# Patient Record
Sex: Male | Born: 1940 | Race: White | Hispanic: No | Marital: Married | Smoking: Former smoker
Health system: Southern US, Community
[De-identification: ages and names within clinical notes are randomized; demographics above are authoritative.]

## PROBLEM LIST (undated history)

## (undated) DIAGNOSIS — K5521 Angiodysplasia of colon with hemorrhage: Secondary | ICD-10-CM

## (undated) DIAGNOSIS — T4145XA Adverse effect of unspecified anesthetic, initial encounter: Secondary | ICD-10-CM

## (undated) DIAGNOSIS — K922 Gastrointestinal hemorrhage, unspecified: Secondary | ICD-10-CM

## (undated) DIAGNOSIS — K209 Esophagitis, unspecified without bleeding: Secondary | ICD-10-CM

## (undated) DIAGNOSIS — T8859XA Other complications of anesthesia, initial encounter: Secondary | ICD-10-CM

## (undated) DIAGNOSIS — F419 Anxiety disorder, unspecified: Secondary | ICD-10-CM

## (undated) DIAGNOSIS — D649 Anemia, unspecified: Secondary | ICD-10-CM

## (undated) DIAGNOSIS — I714 Abdominal aortic aneurysm, without rupture, unspecified: Secondary | ICD-10-CM

## (undated) DIAGNOSIS — K253 Acute gastric ulcer without hemorrhage or perforation: Secondary | ICD-10-CM

## (undated) DIAGNOSIS — E119 Type 2 diabetes mellitus without complications: Secondary | ICD-10-CM

## (undated) DIAGNOSIS — E559 Vitamin D deficiency, unspecified: Secondary | ICD-10-CM

## (undated) DIAGNOSIS — F329 Major depressive disorder, single episode, unspecified: Secondary | ICD-10-CM

## (undated) DIAGNOSIS — E785 Hyperlipidemia, unspecified: Secondary | ICD-10-CM

## (undated) DIAGNOSIS — D132 Benign neoplasm of duodenum: Secondary | ICD-10-CM

## (undated) DIAGNOSIS — R413 Other amnesia: Secondary | ICD-10-CM

## (undated) DIAGNOSIS — E118 Type 2 diabetes mellitus with unspecified complications: Secondary | ICD-10-CM

## (undated) DIAGNOSIS — I219 Acute myocardial infarction, unspecified: Secondary | ICD-10-CM

## (undated) DIAGNOSIS — Z9289 Personal history of other medical treatment: Secondary | ICD-10-CM

## (undated) DIAGNOSIS — K317 Polyp of stomach and duodenum: Secondary | ICD-10-CM

## (undated) DIAGNOSIS — J449 Chronic obstructive pulmonary disease, unspecified: Secondary | ICD-10-CM

## (undated) DIAGNOSIS — K269 Duodenal ulcer, unspecified as acute or chronic, without hemorrhage or perforation: Secondary | ICD-10-CM

## (undated) DIAGNOSIS — I2510 Atherosclerotic heart disease of native coronary artery without angina pectoris: Secondary | ICD-10-CM

## (undated) DIAGNOSIS — G4700 Insomnia, unspecified: Secondary | ICD-10-CM

## (undated) DIAGNOSIS — J439 Emphysema, unspecified: Secondary | ICD-10-CM

## (undated) DIAGNOSIS — J189 Pneumonia, unspecified organism: Secondary | ICD-10-CM

## (undated) DIAGNOSIS — K5790 Diverticulosis of intestine, part unspecified, without perforation or abscess without bleeding: Secondary | ICD-10-CM

## (undated) DIAGNOSIS — Z9981 Dependence on supplemental oxygen: Secondary | ICD-10-CM

## (undated) DIAGNOSIS — F418 Other specified anxiety disorders: Secondary | ICD-10-CM

## (undated) DIAGNOSIS — D72829 Elevated white blood cell count, unspecified: Secondary | ICD-10-CM

## (undated) DIAGNOSIS — K219 Gastro-esophageal reflux disease without esophagitis: Secondary | ICD-10-CM

## (undated) DIAGNOSIS — I5032 Chronic diastolic (congestive) heart failure: Secondary | ICD-10-CM

## (undated) DIAGNOSIS — F3162 Bipolar disorder, current episode mixed, moderate: Secondary | ICD-10-CM

## (undated) DIAGNOSIS — K449 Diaphragmatic hernia without obstruction or gangrene: Secondary | ICD-10-CM

## (undated) DIAGNOSIS — J309 Allergic rhinitis, unspecified: Secondary | ICD-10-CM

## (undated) DIAGNOSIS — K5710 Diverticulosis of small intestine without perforation or abscess without bleeding: Secondary | ICD-10-CM

## (undated) DIAGNOSIS — I10 Essential (primary) hypertension: Secondary | ICD-10-CM

## (undated) HISTORY — DX: Type 2 diabetes mellitus with unspecified complications: E11.8

## (undated) HISTORY — DX: Gastrointestinal hemorrhage, unspecified: K92.2

## (undated) HISTORY — DX: Diaphragmatic hernia without obstruction or gangrene: K44.9

## (undated) HISTORY — PX: APPENDECTOMY: SHX54

## (undated) HISTORY — DX: Diverticulosis of small intestine without perforation or abscess without bleeding: K57.10

## (undated) HISTORY — DX: Acute gastric ulcer without hemorrhage or perforation: K25.3

## (undated) HISTORY — DX: Esophagitis, unspecified: K20.9

## (undated) HISTORY — DX: Atherosclerotic heart disease of native coronary artery without angina pectoris: I25.10

## (undated) HISTORY — DX: Benign neoplasm of duodenum: D13.2

## (undated) HISTORY — DX: Allergic rhinitis, unspecified: J30.9

## (undated) HISTORY — PX: TONSILLECTOMY: SUR1361

## (undated) HISTORY — PX: FEMORAL ARTERY STENT: SHX1583

## (undated) HISTORY — DX: Anxiety disorder, unspecified: F41.9

## (undated) HISTORY — PX: CORONARY ARTERY BYPASS GRAFT: SHX141

## (undated) HISTORY — DX: Major depressive disorder, single episode, unspecified: F32.9

## (undated) HISTORY — DX: Esophagitis, unspecified without bleeding: K20.90

## (undated) HISTORY — PX: LACERATION REPAIR: SHX5168

## (undated) HISTORY — PX: CARDIAC CATHETERIZATION: SHX172

---
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
## 1998-02-11 ENCOUNTER — Ambulatory Visit (HOSPITAL_COMMUNITY): Admission: RE | Admit: 1998-02-11 | Discharge: 1998-02-11 | Payer: Self-pay | Admitting: *Deleted

## 1998-02-26 ENCOUNTER — Encounter: Admission: RE | Admit: 1998-02-26 | Discharge: 1998-02-26 | Payer: Self-pay | Admitting: Family Medicine

## 1998-03-24 ENCOUNTER — Encounter: Admission: RE | Admit: 1998-03-24 | Discharge: 1998-03-24 | Payer: Self-pay | Admitting: Family Medicine

## 2000-10-15 ENCOUNTER — Encounter: Payer: Self-pay | Admitting: Emergency Medicine

## 2000-10-15 ENCOUNTER — Emergency Department (HOSPITAL_COMMUNITY): Admission: EM | Admit: 2000-10-15 | Discharge: 2000-10-15 | Payer: Self-pay | Admitting: Emergency Medicine

## 2001-01-08 ENCOUNTER — Inpatient Hospital Stay (HOSPITAL_COMMUNITY): Admission: EM | Admit: 2001-01-08 | Discharge: 2001-01-09 | Payer: Self-pay

## 2006-07-28 ENCOUNTER — Inpatient Hospital Stay (HOSPITAL_COMMUNITY): Admission: AD | Admit: 2006-07-28 | Discharge: 2006-07-31 | Payer: Self-pay | Admitting: Cardiovascular Disease

## 2007-03-01 IMAGING — CR DG CHEST 2V
2 series · 2 of 2 positions shown · non-contrast
Comparison: There are currently no prior studies available for comparison.

CLINICAL DATA: Chest pain, pre-catheterization. 
 CHEST - 2 VIEW ? 07/28/06:

[w chest pa]
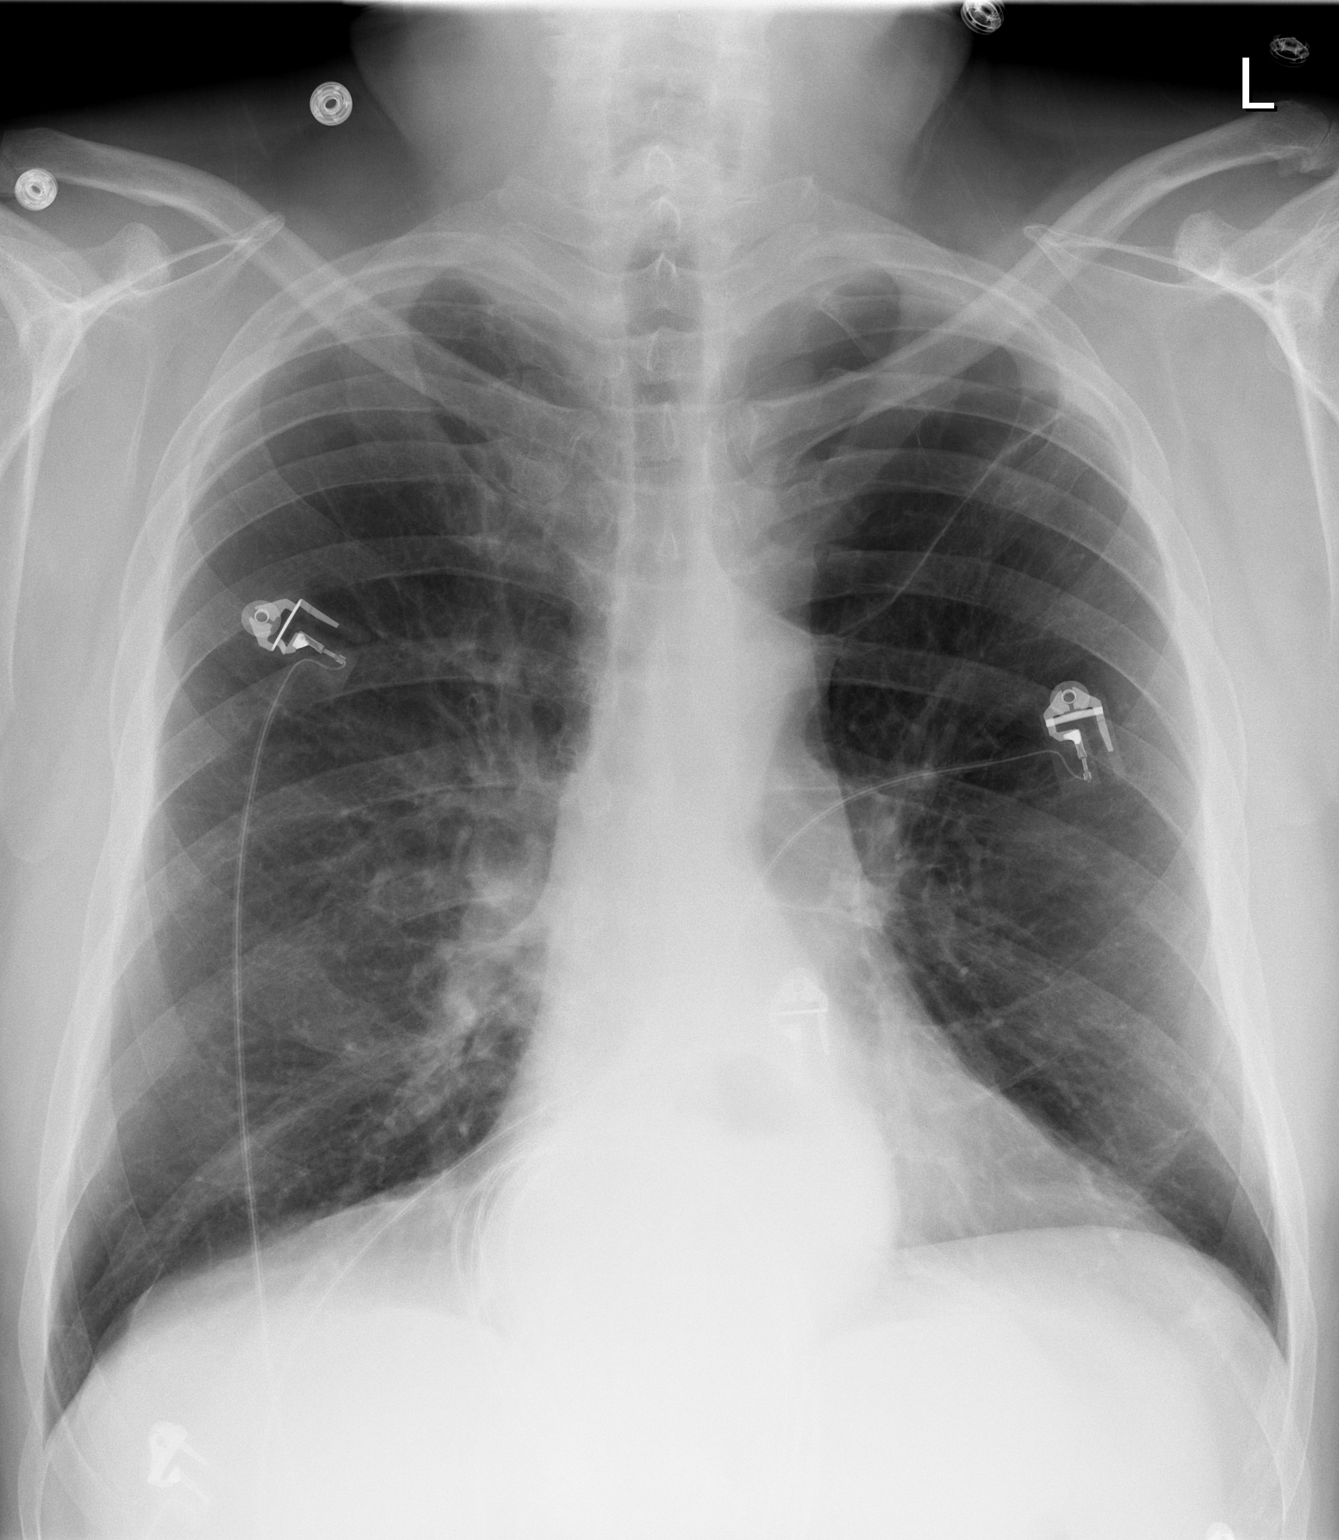

[w chest lat]
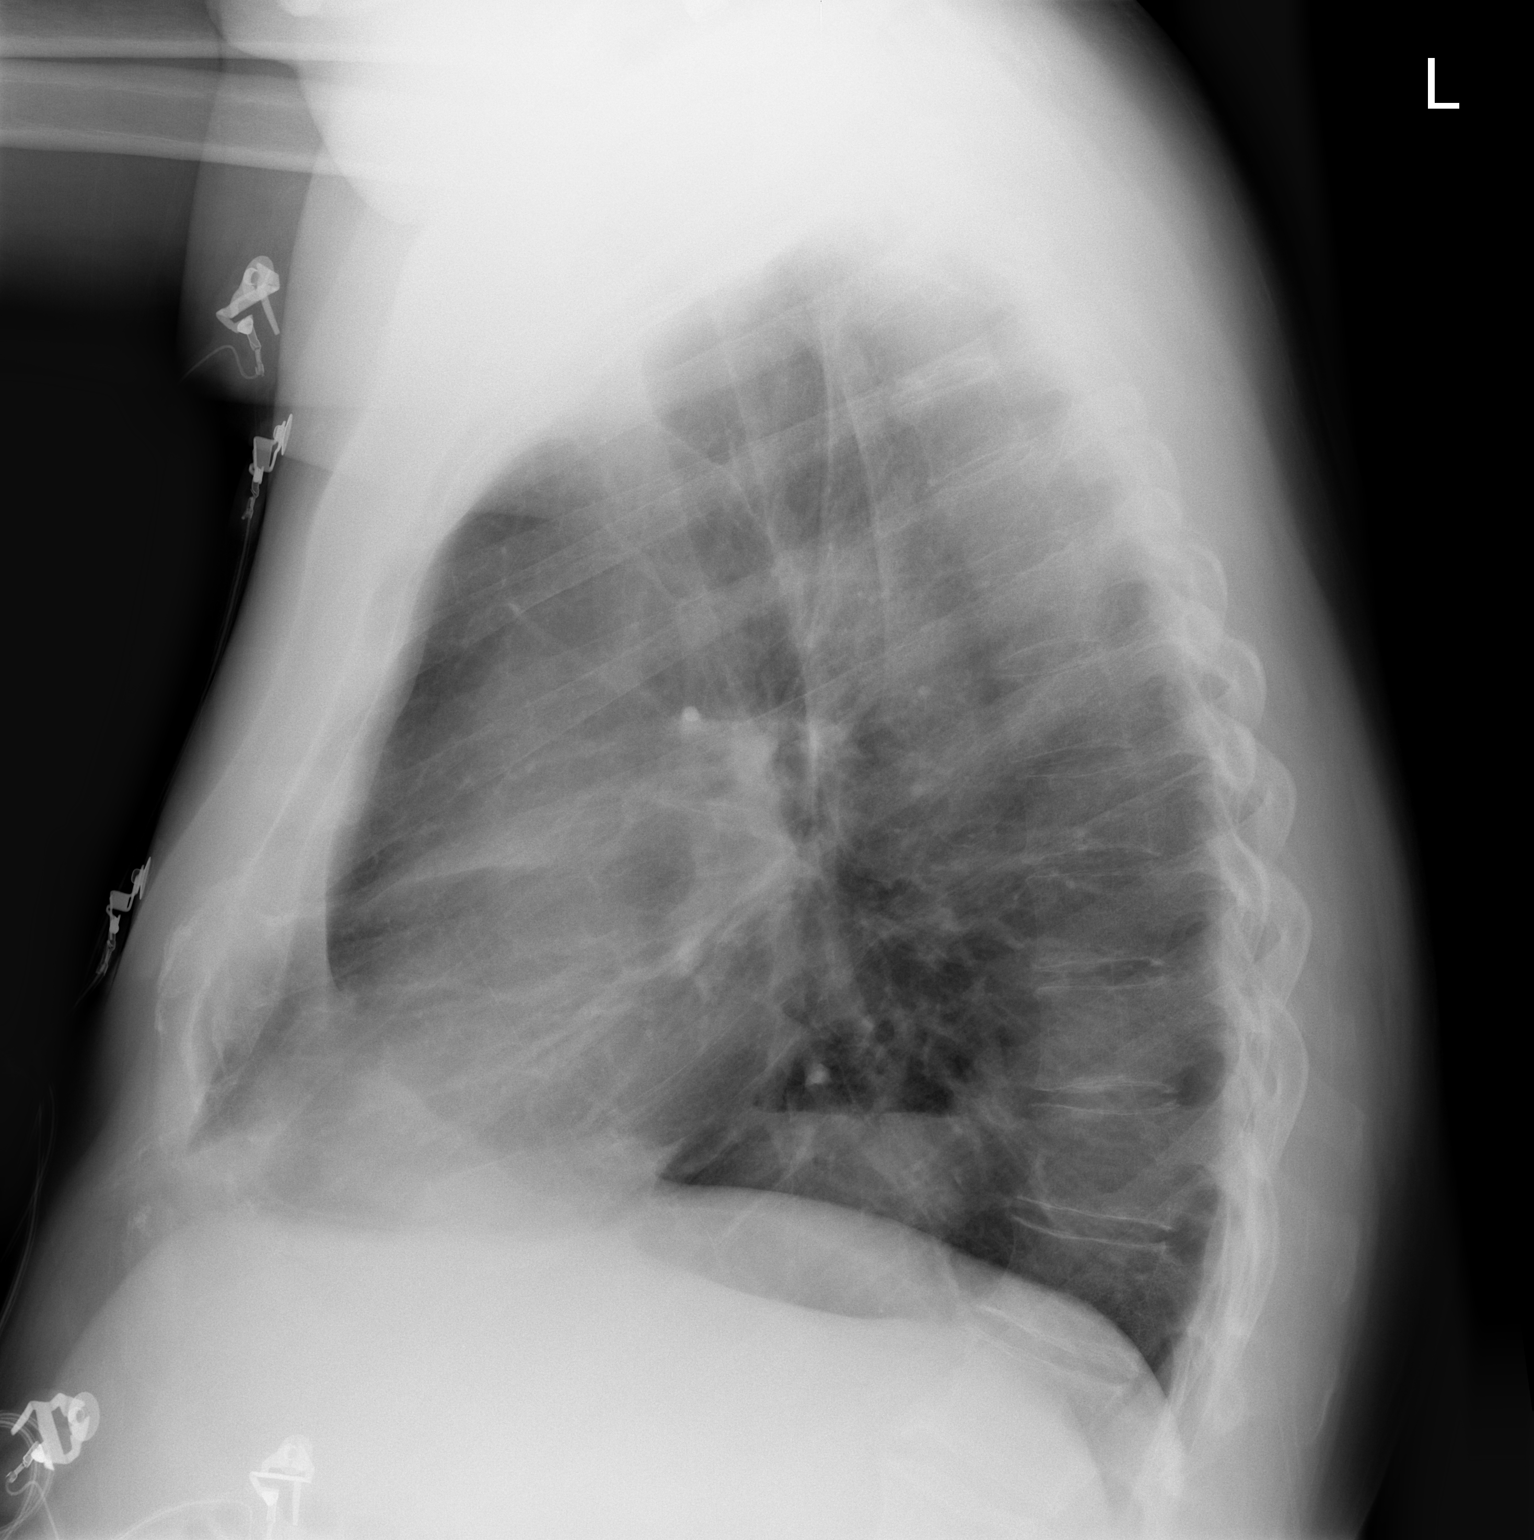

[2 of 2 positions shown; findings below may reference images not displayed]

FINDINGS: There are large bullous areas seen within the left upper lobe. There is a large hiatal hernia which contains an air fluid level.   There are changes of COPD noted.  There are no infiltrative or edematous changes and the heart is normal in size.   No mediastinal or hilar abnormalities.
IMPRESSION: 1.  Changes of COPD with a large bullous area within the left upper lobe.  
 2.  Large hiatal hernia. 
 3.  No evidence for active chest disease.

## 2008-01-04 ENCOUNTER — Ambulatory Visit: Payer: Self-pay | Admitting: Surgery

## 2008-01-04 ENCOUNTER — Ambulatory Visit: Payer: Self-pay | Admitting: Internal Medicine

## 2008-01-04 ENCOUNTER — Inpatient Hospital Stay (HOSPITAL_COMMUNITY): Admission: EM | Admit: 2008-01-04 | Discharge: 2008-01-21 | Payer: Self-pay | Admitting: Emergency Medicine

## 2008-01-16 ENCOUNTER — Ambulatory Visit: Payer: Self-pay | Admitting: Physical Medicine & Rehabilitation

## 2008-02-04 ENCOUNTER — Ambulatory Visit: Payer: Self-pay | Admitting: Surgery

## 2008-02-04 ENCOUNTER — Encounter: Admission: RE | Admit: 2008-02-04 | Discharge: 2008-02-04 | Payer: Self-pay | Admitting: Surgery

## 2008-04-28 ENCOUNTER — Ambulatory Visit: Payer: Self-pay | Admitting: Surgery

## 2008-08-06 IMAGING — CR DG ABDOMEN ACUTE W/ 1V CHEST
1 series · 1 of 1 positions shown · non-contrast
Comparison: 07/28/06

CLINICAL DATA: Chest pain and abdominal pain.
 ACUTE ABDOMINAL SERIES WITH CHEST ? 3 VIEW:

[w chest pa]
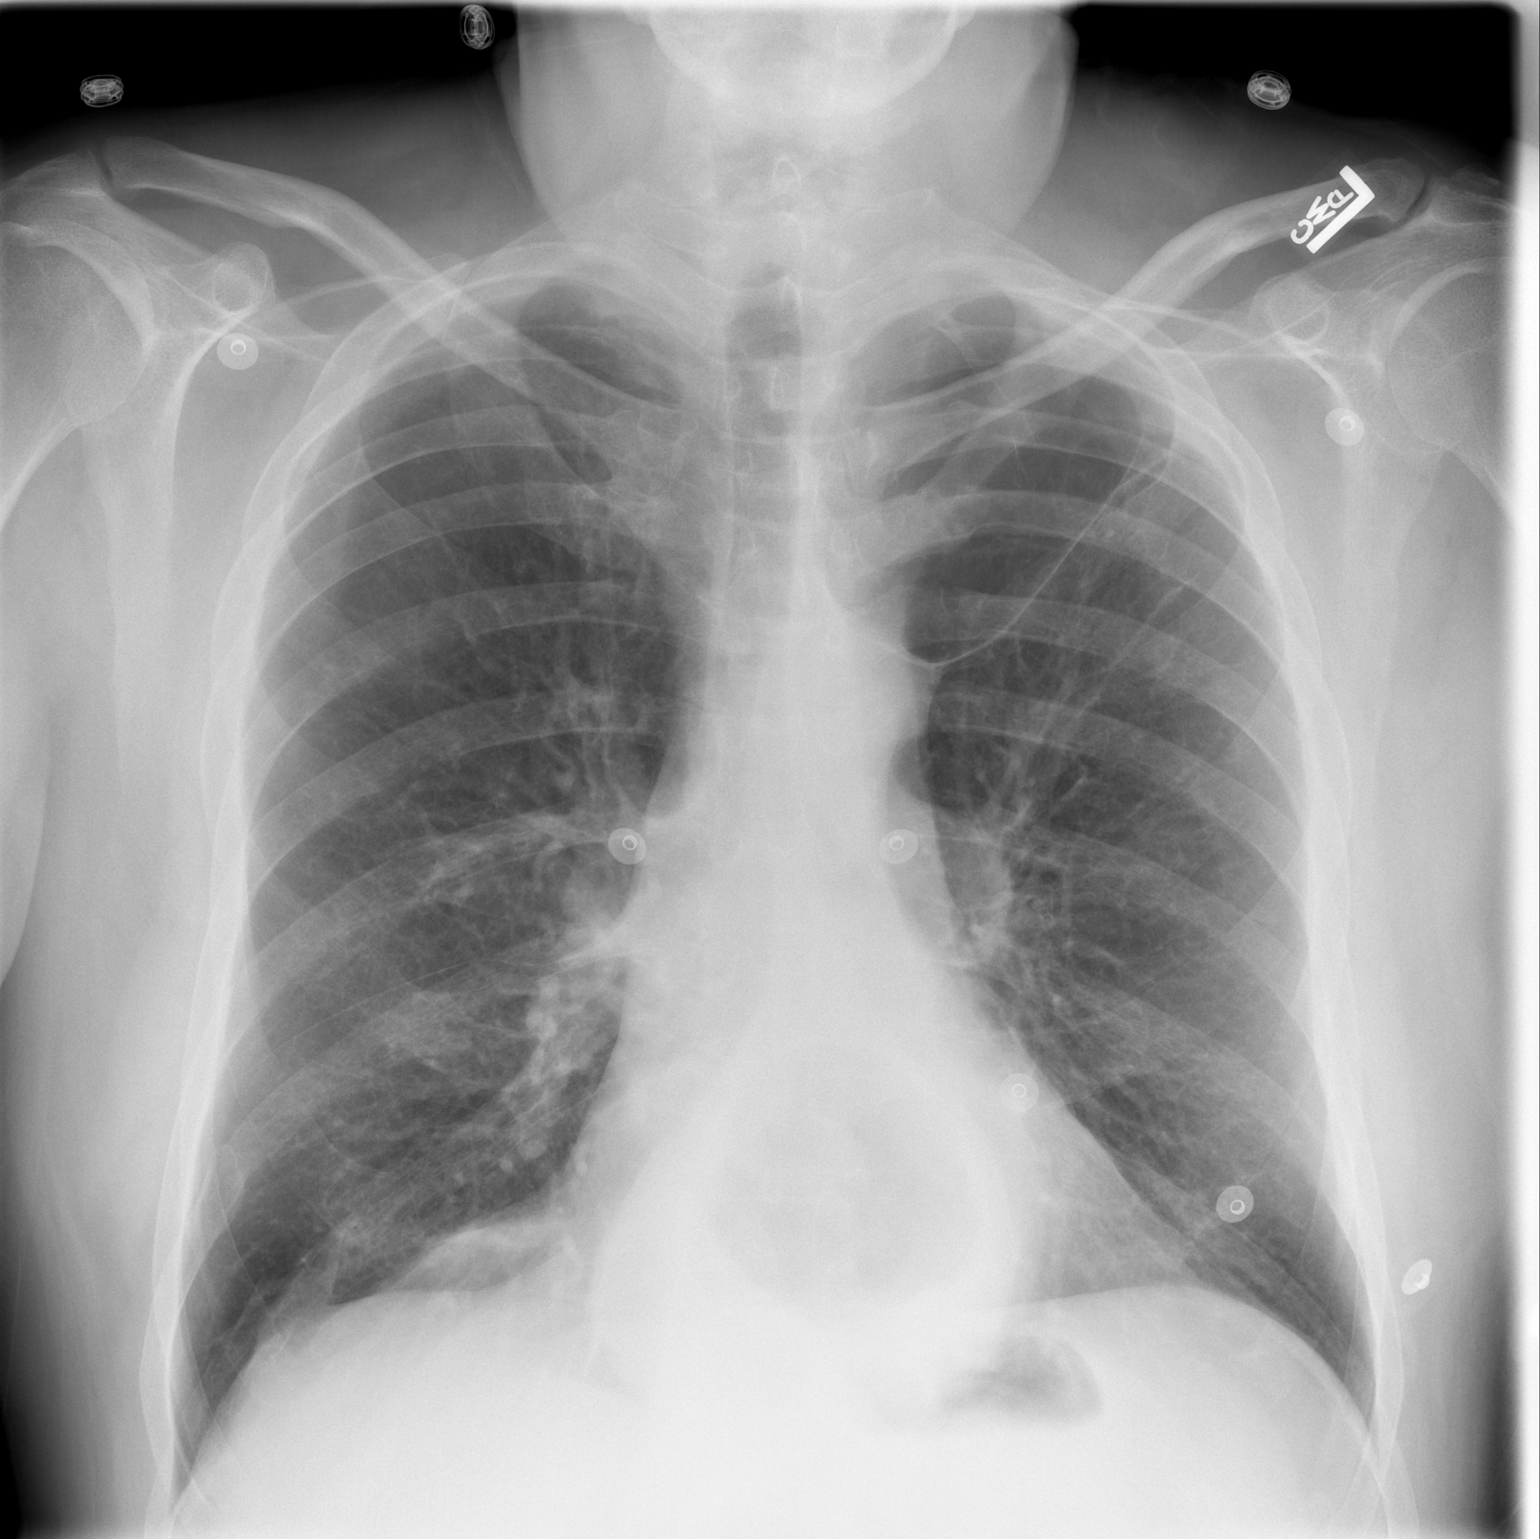

[1 of 1 positions shown; findings below may reference images not displayed]

FINDINGS: Heart size normal.  
 No pleural fluid or pulmonary edema.
 There is bullous emphysema.  There is a large bulla within the left upper lobe measuring 10.8 cm unchanged from prior exam.
 Large hiatal hernia is present.
 There is a nonobstructive bowel gas pattern.  Gas and stool is identified within the colon to the level of the rectum.
IMPRESSION: 1.  Advanced bullous emphysema.
 2.  Large hiatal hernia.
 3.  No specific features of bowel obstruction.

## 2008-08-06 IMAGING — US US SCROTUM
1 series · 13 of 25 positions shown · non-contrast
Comparison: None.

CLINICAL DATA: Scrotal pain.
SCROTAL ULTRASOUND:

DOPPLER ULTRASOUND OF THE TESTICLES:
TECHNIQUE: Complete ultrasound examination of the testicles, epididymis, and other scrotal structures was performed.  Color and spectral Doppler ultrasound were also utilized to evaluate blood flow to the testicles.

[Series 1: unknown · 0.07mm/px · 13 of 71 slices shown]
[im 1/71]
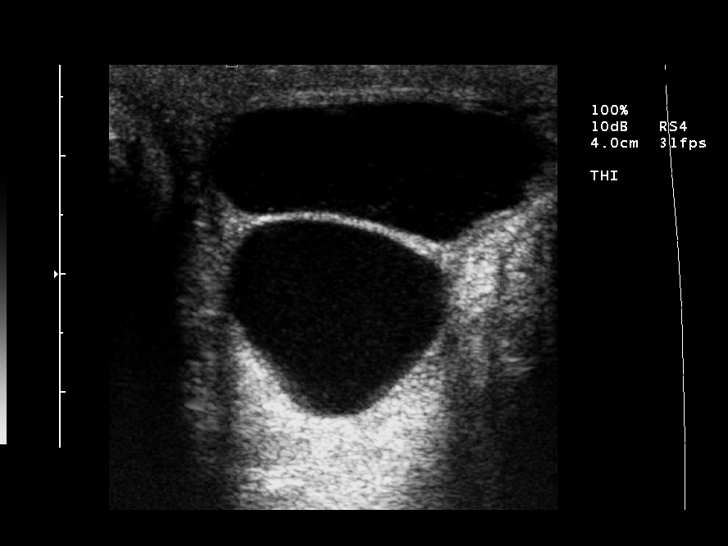
[im 6/71]
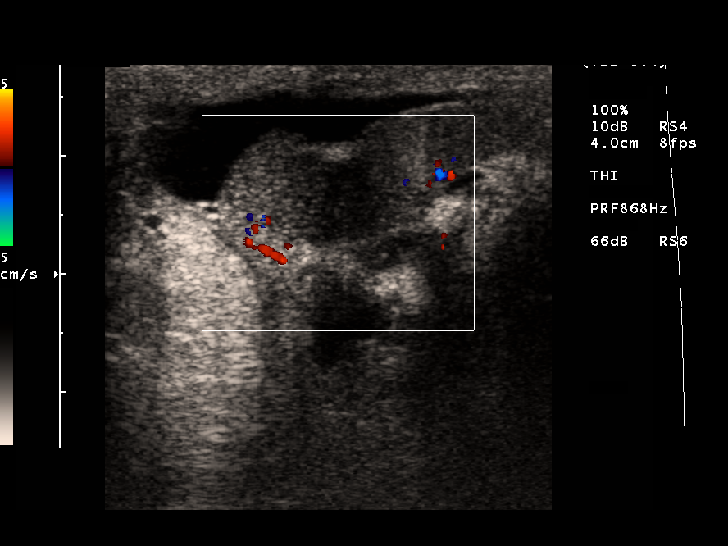
[im 12/71]
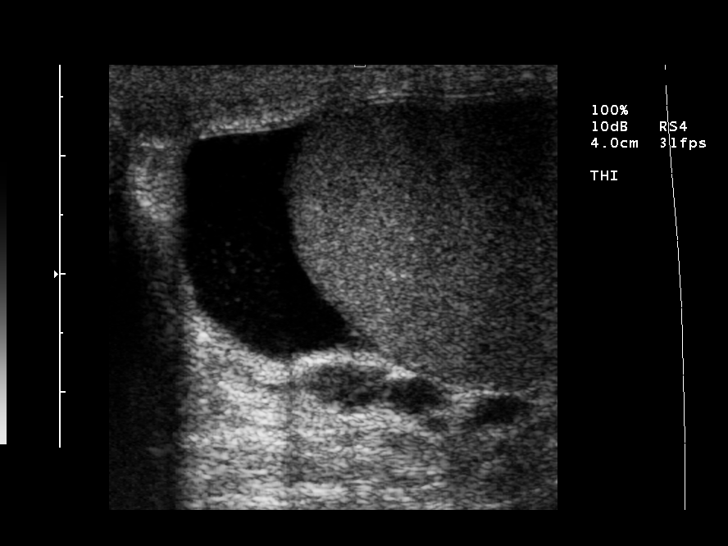
[im 18/71]
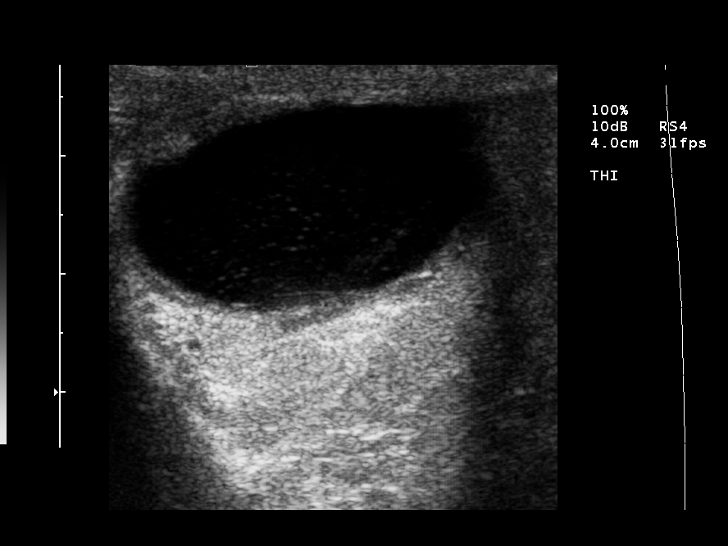
[im 24/71]
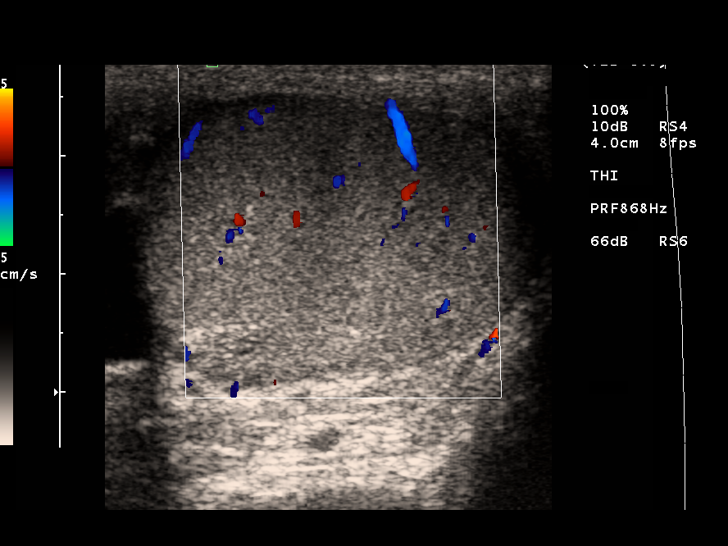
[im 30/71]
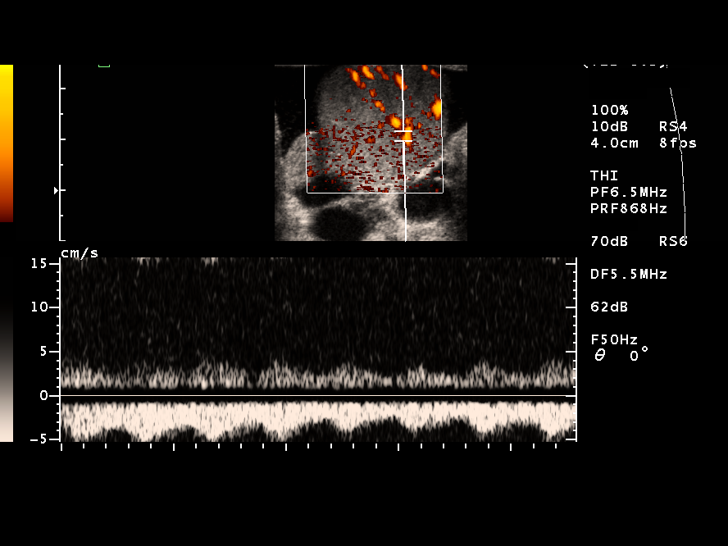
[im 36/71]
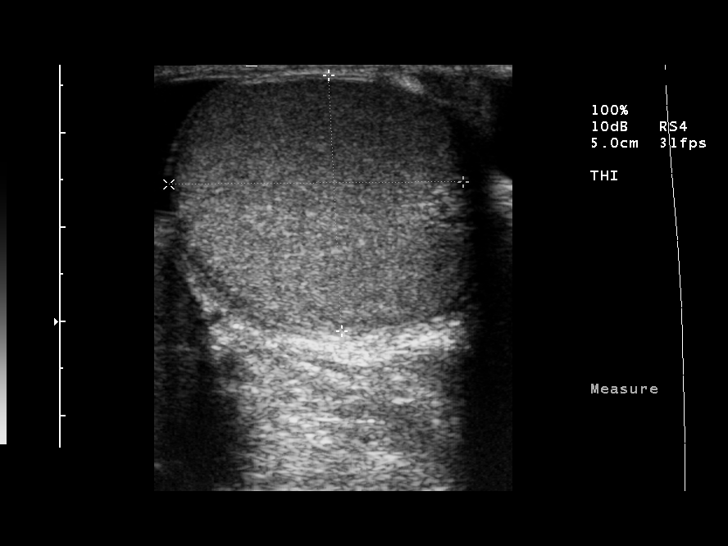
[im 41/71]
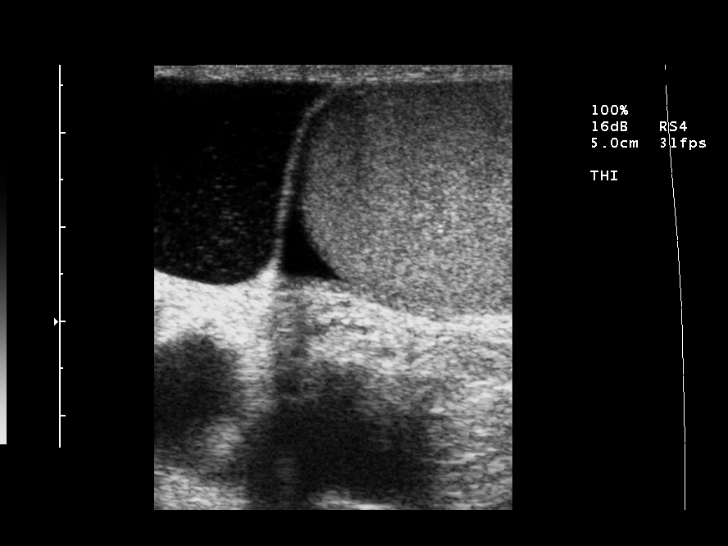
[im 47/71]
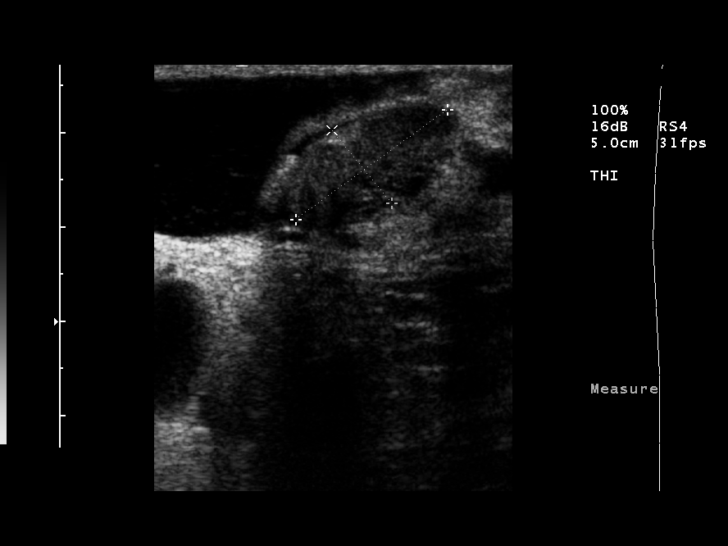
[im 53/71]
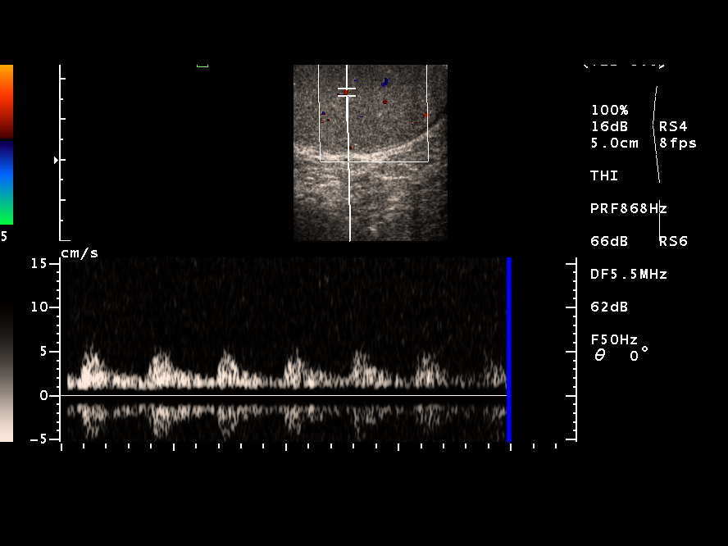
[im 59/71]
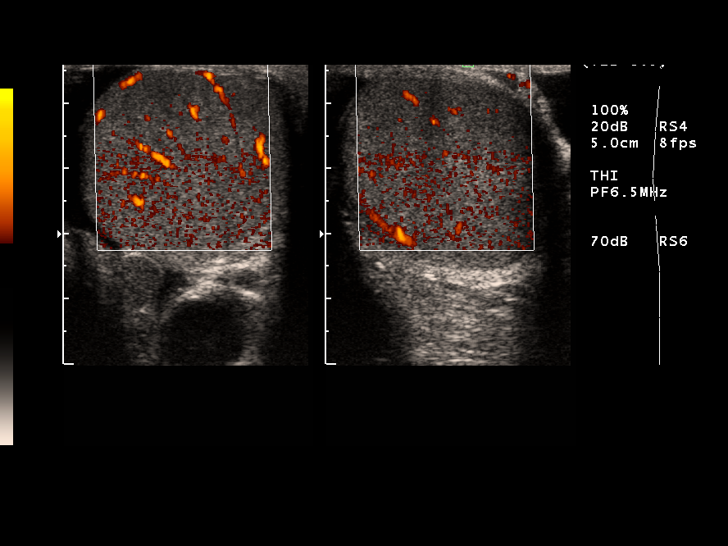
[im 65/71]
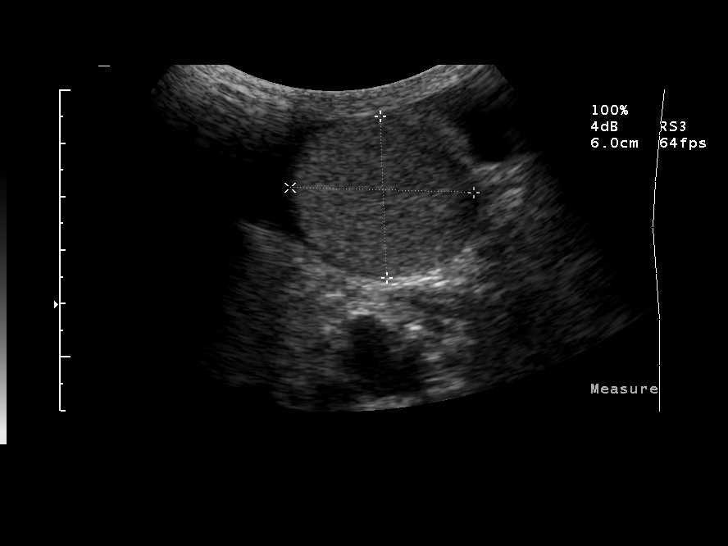
[im 71/71]
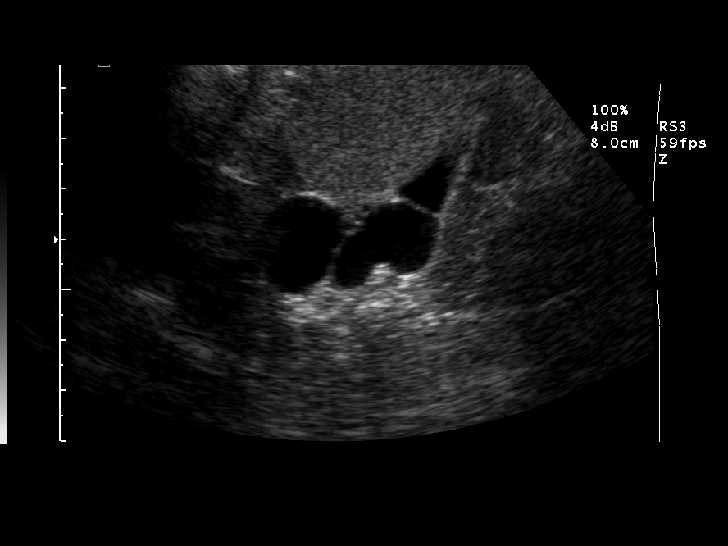

[13 of 25 positions shown; findings below may reference images not displayed]

FINDINGS: The right testis is normal measuring 4.3 x 3.1 x 3.1 cm.  There is normal arterial and venous waveforms without evidence for torsion.  The left testis is also normal.  The left testis appears normal measuring 4.6 x 3.4 x 3.0 cm.  There are normal arterial waveforms to the left testis.  A venous waveform could not be interrogated, for whatever reason.  
There is septated exophytic hypoechoic cystic structure arising from the right epididymis which measures 2.3 x 2.3 x 2.0 cm.  The left epididymis appears normal.  There are large bilateral hydroceles.
IMPRESSION: 1.  No specific features of testicular torsion.
2.  Exophytic cystic mass arising from right epididymis.  Differential diagnosis includes spermatocele, epididymal cyst.  Less likely, this could represent an abscess or hematoma, careful clinical correlation suggested.
 3.  Large bilateral hydroceles.

## 2008-08-06 IMAGING — CR DG ABDOMEN ACUTE W/ 1V CHEST
3 series · 3 of 3 positions shown · non-contrast
Comparison: 07/28/06

CLINICAL DATA: Chest pain and abdominal pain.
 ACUTE ABDOMINAL SERIES WITH CHEST ? 3 VIEW:

[w abdomen upright]
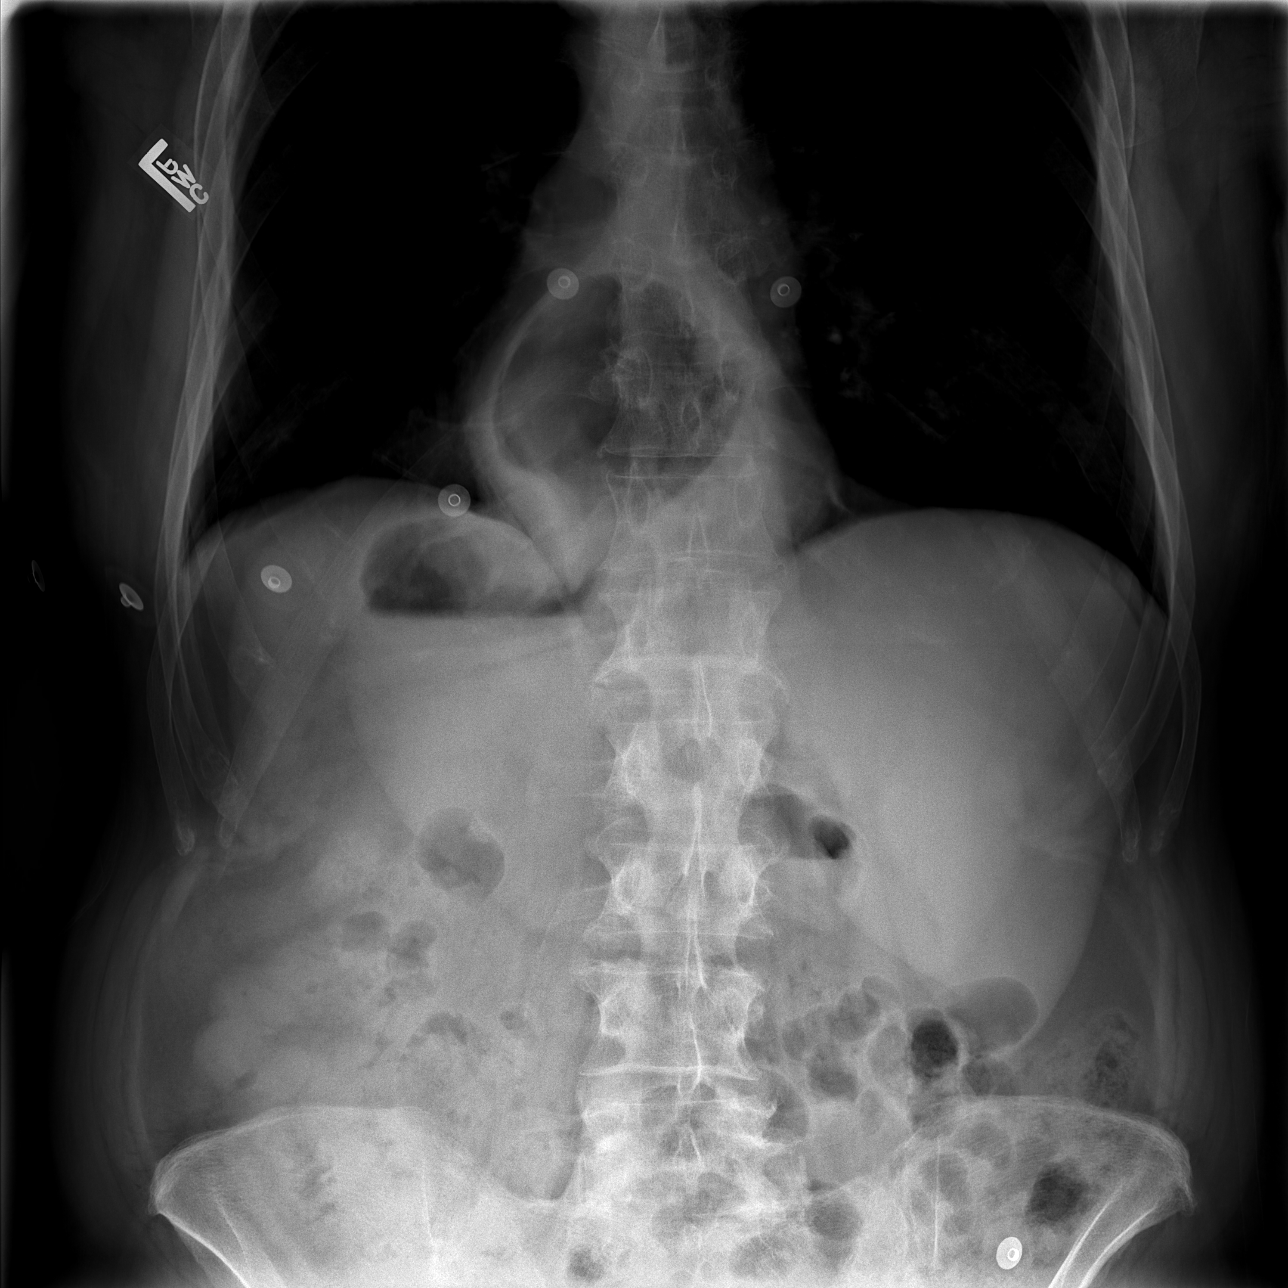

[t abdomen supine (1 of 2)]
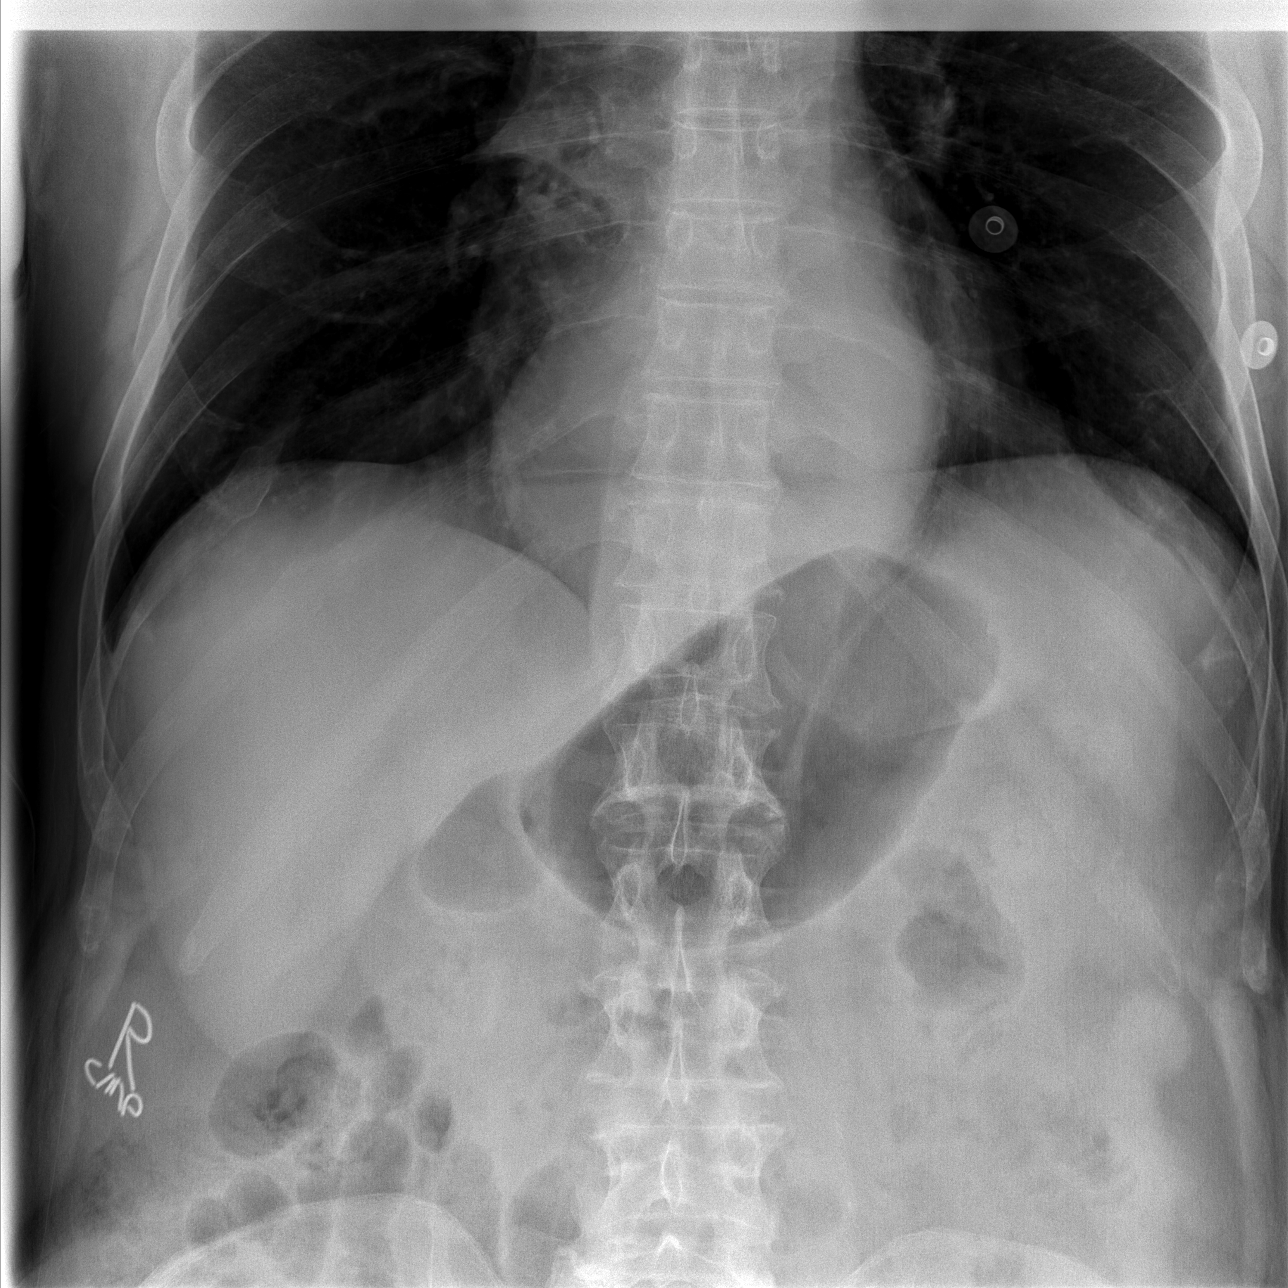

[t abdomen supine (2 of 2)]
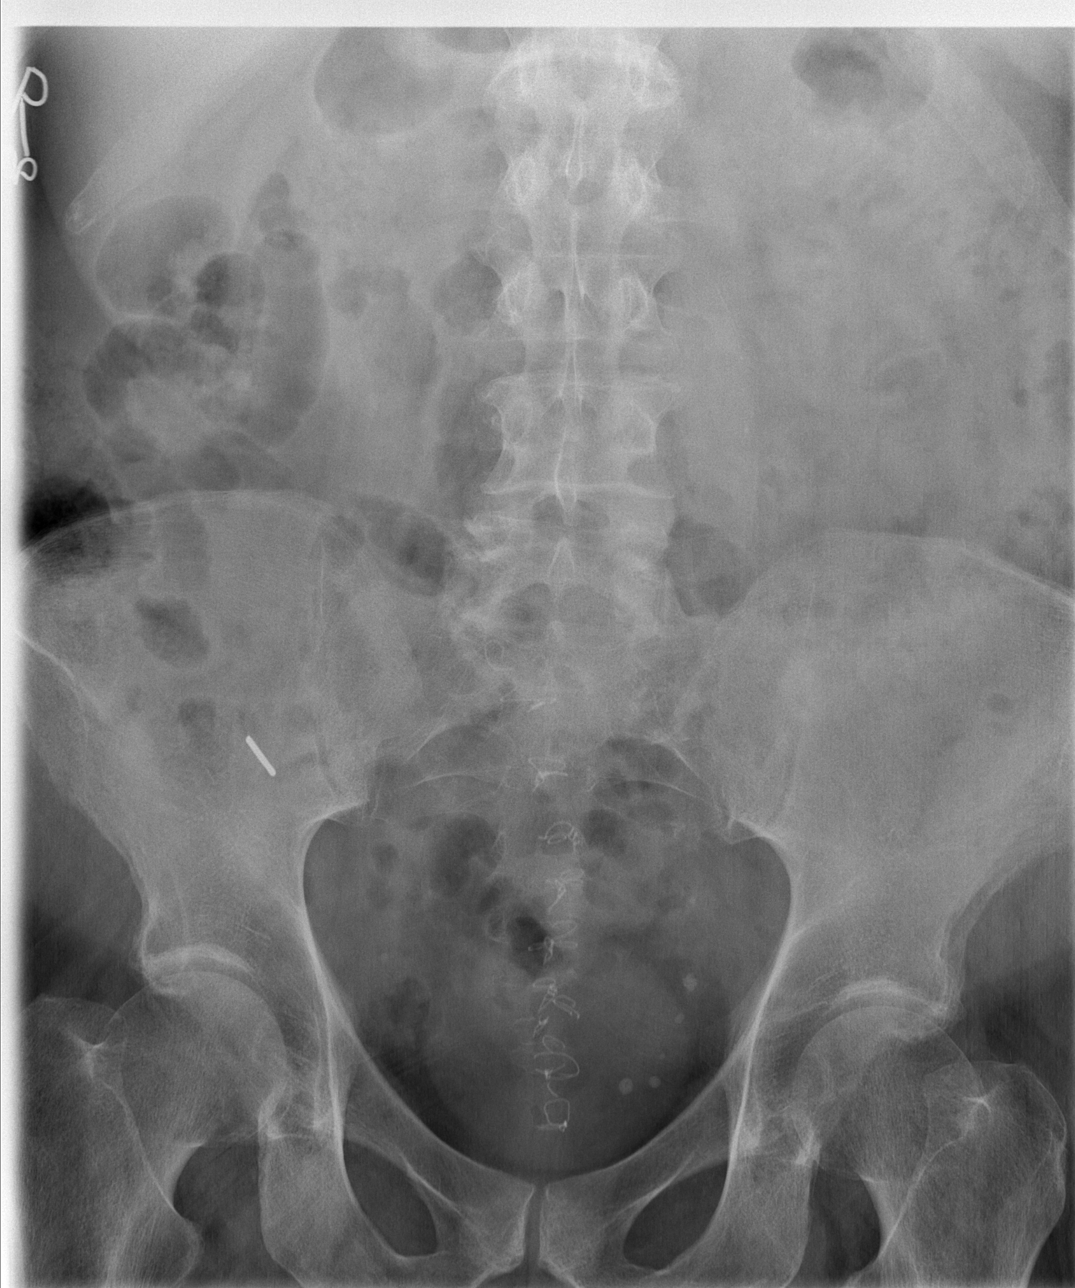

[3 of 3 positions shown; findings below may reference images not displayed]

FINDINGS: Heart size normal.  
 No pleural fluid or pulmonary edema.
 There is bullous emphysema.  There is a large bulla within the left upper lobe measuring 10.8 cm unchanged from prior exam.
 Large hiatal hernia is present.
 There is a nonobstructive bowel gas pattern.  Gas and stool is identified within the colon to the level of the rectum.
IMPRESSION: 1.  Advanced bullous emphysema.
 2.  Large hiatal hernia.
 3.  No specific features of bowel obstruction.

## 2008-08-07 IMAGING — RF DG ABDOMEN 2V
1 series · 3 of 3 positions shown · non-contrast
Comparison: none

CLINICAL DATA: AAA stent.
 ABDOMEN ? 3 SPOT VIEW:

[Series 1: run · 3 of 3 slices shown]
[im 1/3]
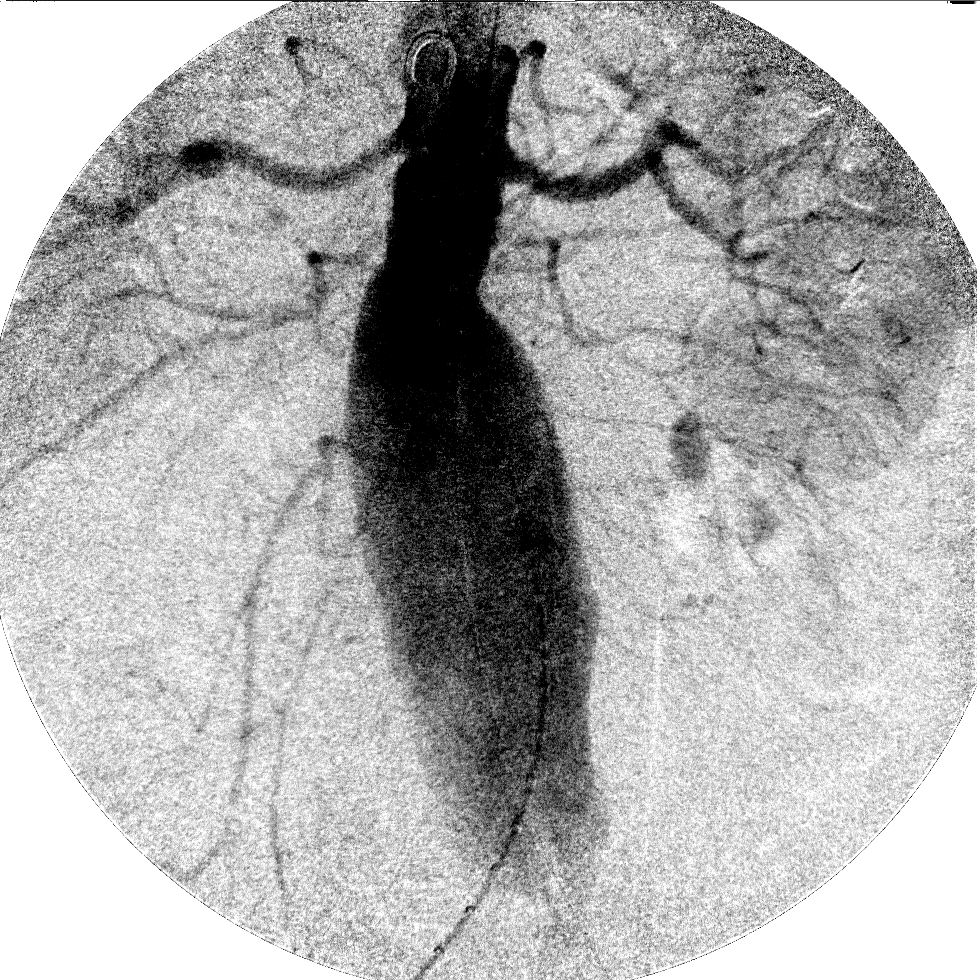
[im 2/3]
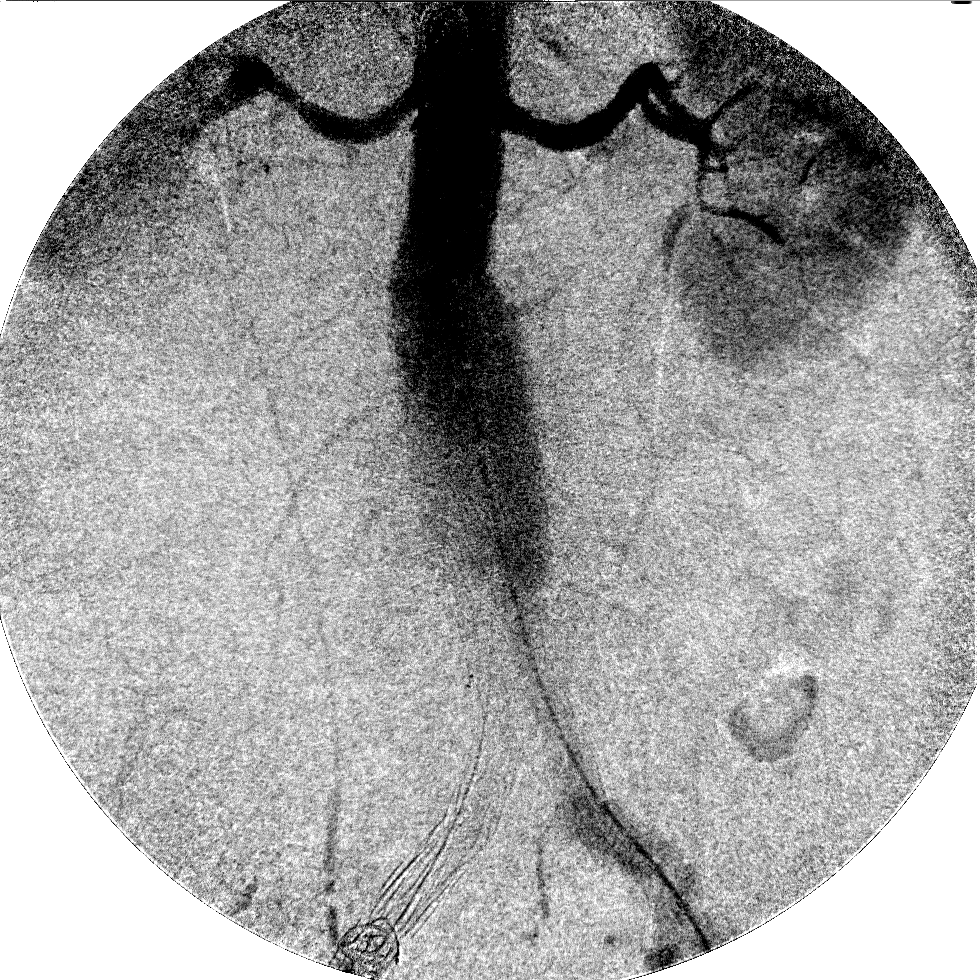
[im 3/3]
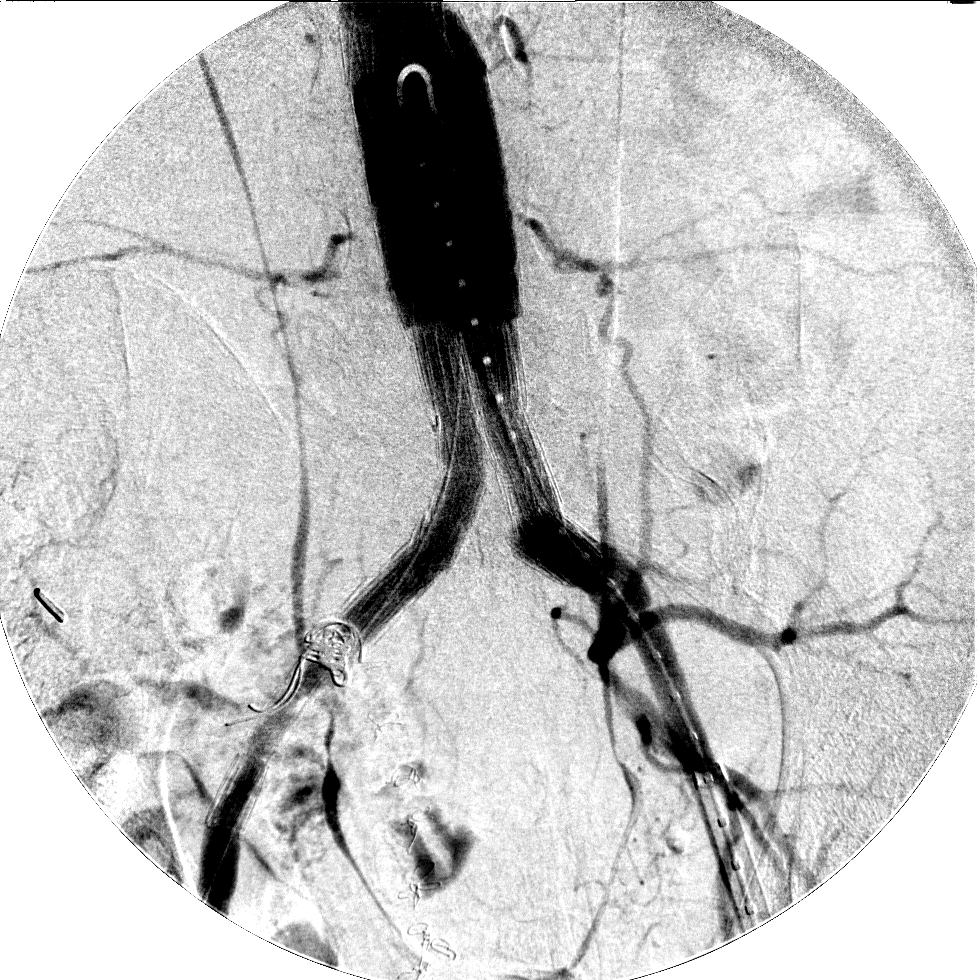

[3 of 3 positions shown; findings below may reference images not displayed]

FINDINGS: Fluoroscopic imaging was provided for aortic and bi-iliac stent graft placement.
IMPRESSION: See above.

## 2008-08-07 IMAGING — CT CT ANGIO ABDOMEN
2 of 5 series · 17 of 46 positions shown, 19 images · IV contrast (agent unspecified)
Comparison: Earlier today.

CLINICAL DATA: Abdominal pain, evaluate abdominal aortic aneurysm for dissection.
CT ANGIOGRAPHY OF ABDOMEN:
TECHNIQUE: Multidetector CT imaging of the abdomen was performed during bolus injection of intravenous contrast.  Multiplanar CT angiographic image reconstructions were generated to evaluate the vascular anatomy.
Contrast:  855cc
TECHNIQUE: Multidetector CT imaging of the pelvis was performed during bolus injection of intravenous contrast.  Multiplanar CT angiographic image reconstructions were generated to evaluate the vascular anatomy.

[Series 4: dissection 2.0 st · axial · 0.76mm/px · z∈[-495,-57]mm · 14 of 241 slices shown, 16 images]
[im 11/241  soft-tissue]
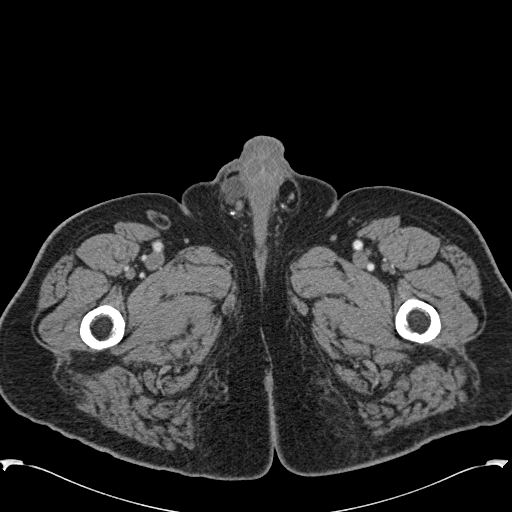
[im 11/241  bone]
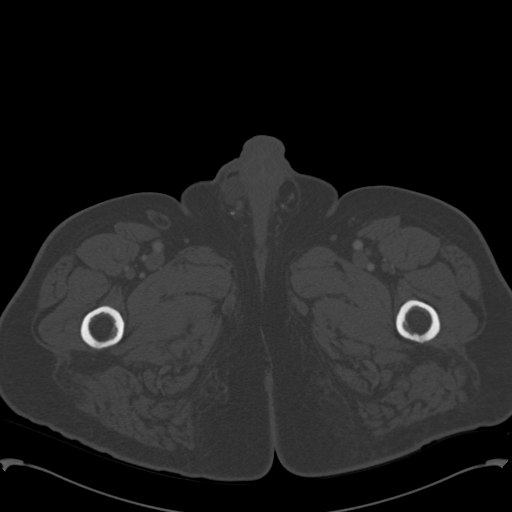
[im 33/241  soft-tissue]
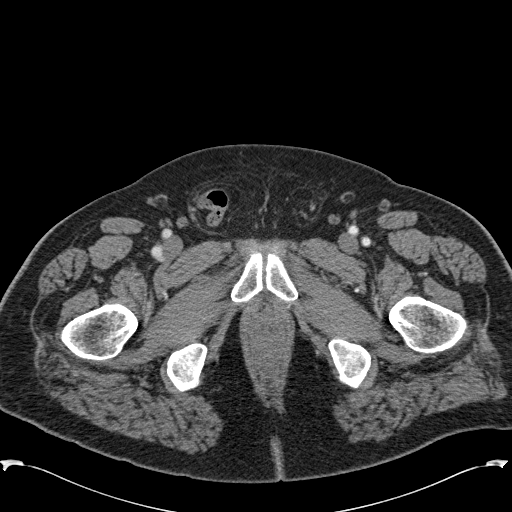
[im 44/241  soft-tissue]
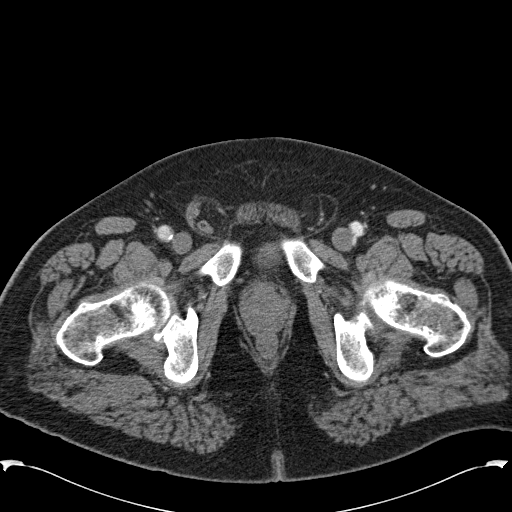
[im 66/241  soft-tissue]
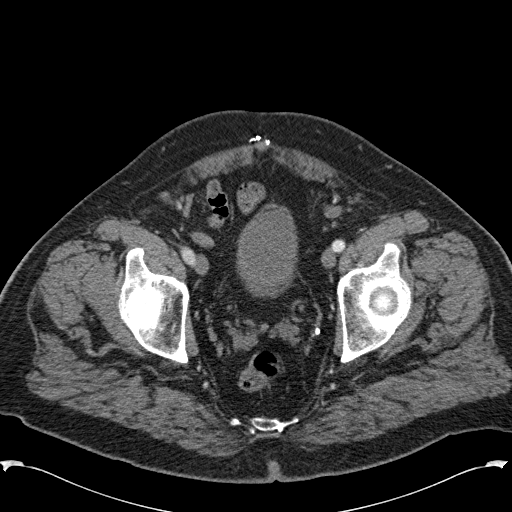
[im 77/241  soft-tissue]
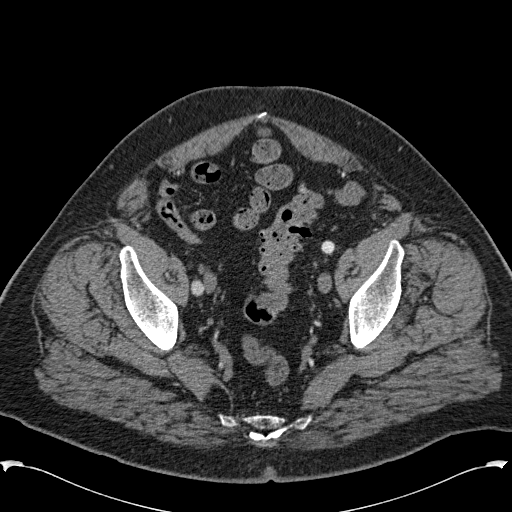
[im 99/241  soft-tissue]
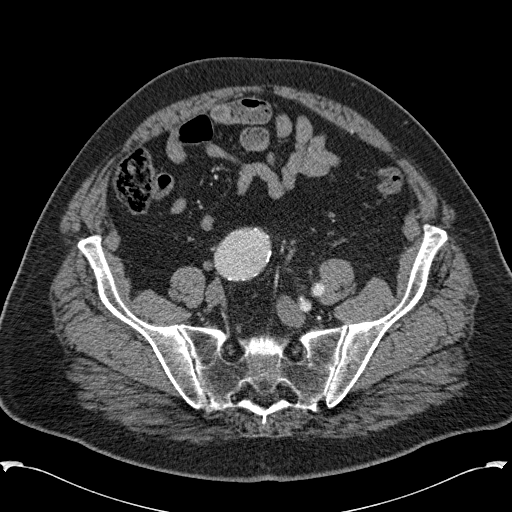
[im 110/241  soft-tissue]
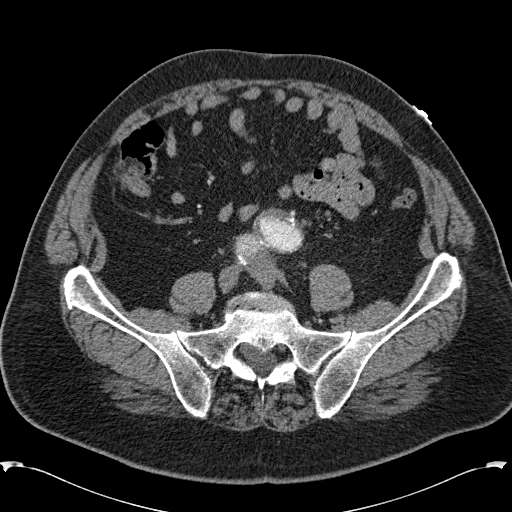
[im 131/241  soft-tissue]
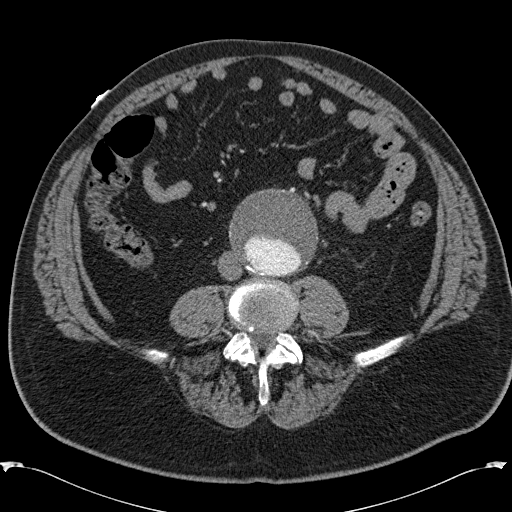
[im 142/241  soft-tissue]
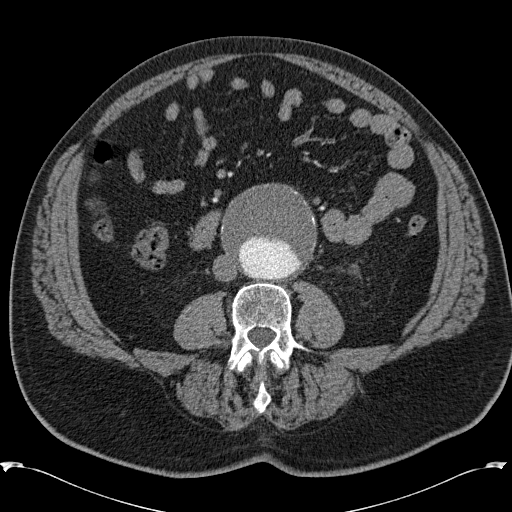
[im 142/241  bone]
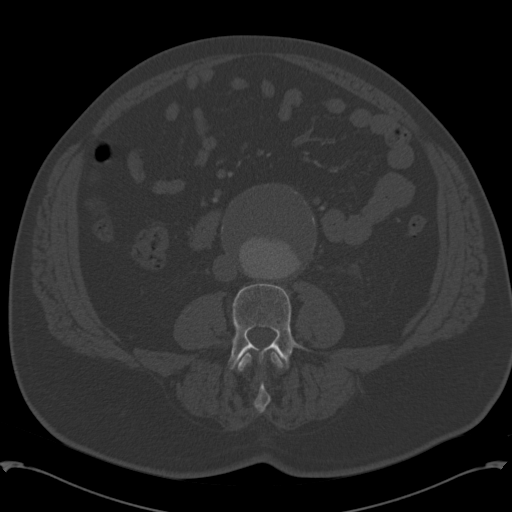
[im 164/241  soft-tissue]
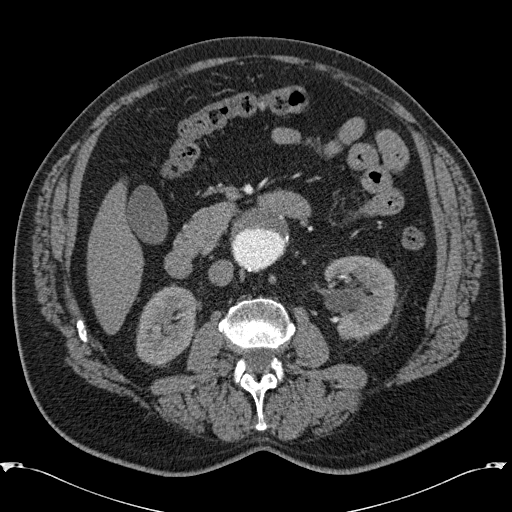
[im 175/241  soft-tissue]
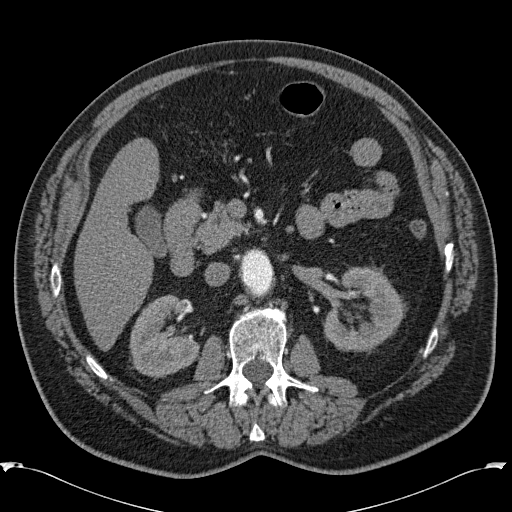
[im 197/241  soft-tissue]
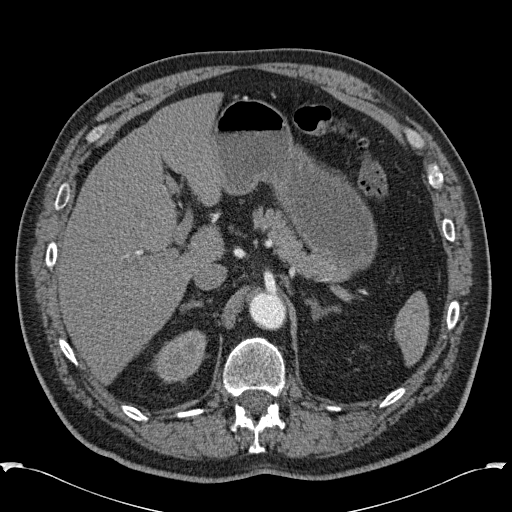
[im 208/241  soft-tissue]
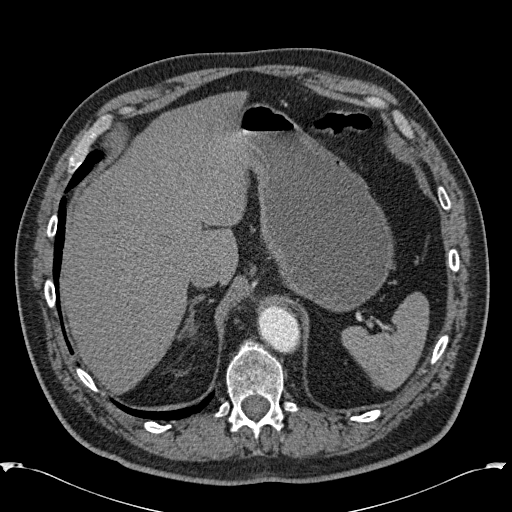
[im 230/241  soft-tissue]
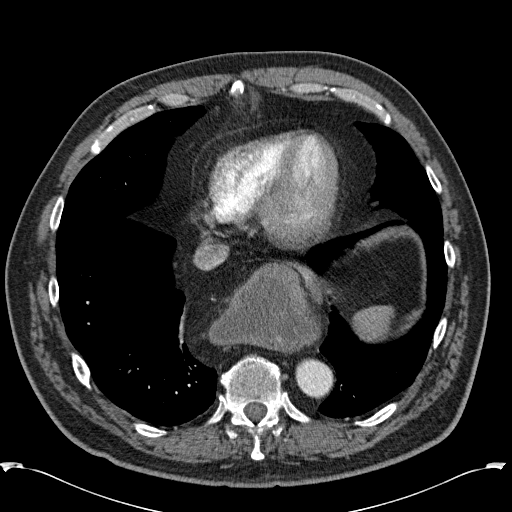

[Series 7: dissection 2.0 st cor · coronal · 0.99mm/px · 3 of 161 slices shown]
[im 54/161  soft-tissue]
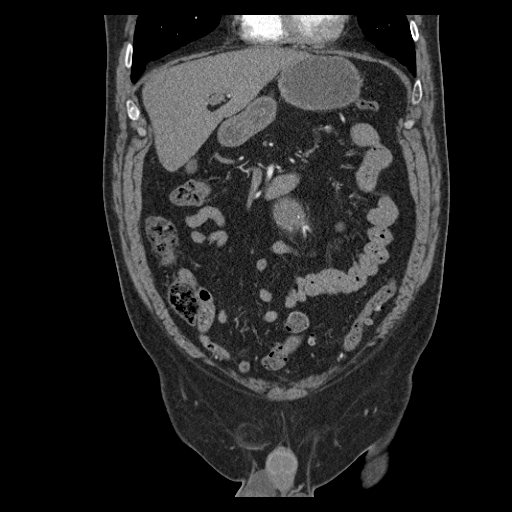
[im 72/161  soft-tissue]
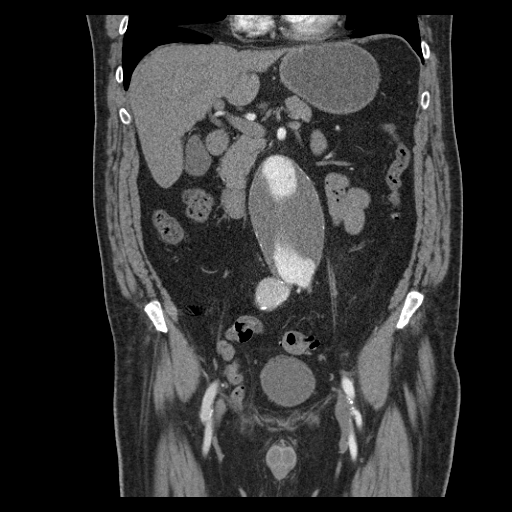
[im 89/161  soft-tissue]
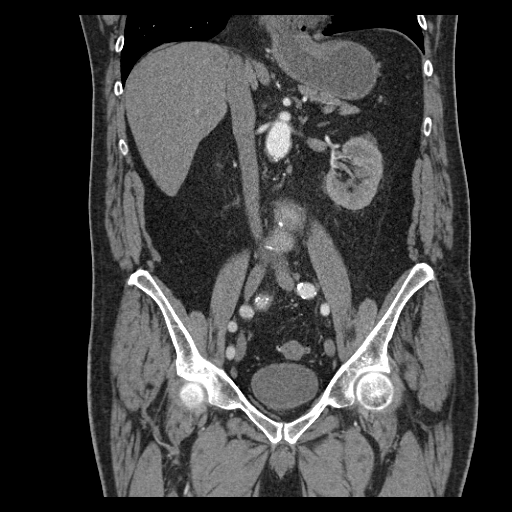

[17 of 46 positions shown; findings below may reference images not displayed]

FINDINGS: There is infrarenal abdominal aortic aneurysm which measures 7 cm in AP dimension, image 100.  There is extensive eccentric mural thrombus which measures 4 cm in thickness. No evidence for aortic dissection is identified.  Again noted is a large hiatal hernia.
The liver is normal in attenuation and morphology.  
The spleen is normal.
Both adrenal glands are normal.
The pancreas is normal.
Both kidneys are unremarkable.  
There is no free fluid or abnormal fluid collections.  The bowel loops of the upper abdomen are nondilated.
IMPRESSION: Large infrarenal abdominal aortic aneurysm containing eccentric mural thrombus.  No acute finding noted. 
CT ANGIOGRAPHY OF PELVIS:
FINDINGS: There is a right common iliac artery aneurysm which measures 3.7 cm, image 143.  The neck of the aneurysm measures approximately 4.9 cm.  There is no evidence for dissection.  Urinary bladder is negative.  There is a right inguinal hernia which contains nonobstructed loop of small bowel.  No free fluid or abnormal fluid collections.  There is no mass.
Diverticular change affects the sigmoid colon.
IMPRESSION: Right common iliac artery aneurysm without dissection.

## 2008-08-07 IMAGING — CR DG CHEST 1V PORT
1 series · 1 of 1 positions shown · non-contrast
Comparison: 07/28/2006

CLINICAL DATA: AAA;  STATUS POST STENTING

PORTABLE CHEST - 1 VIEW

[view not recorded]
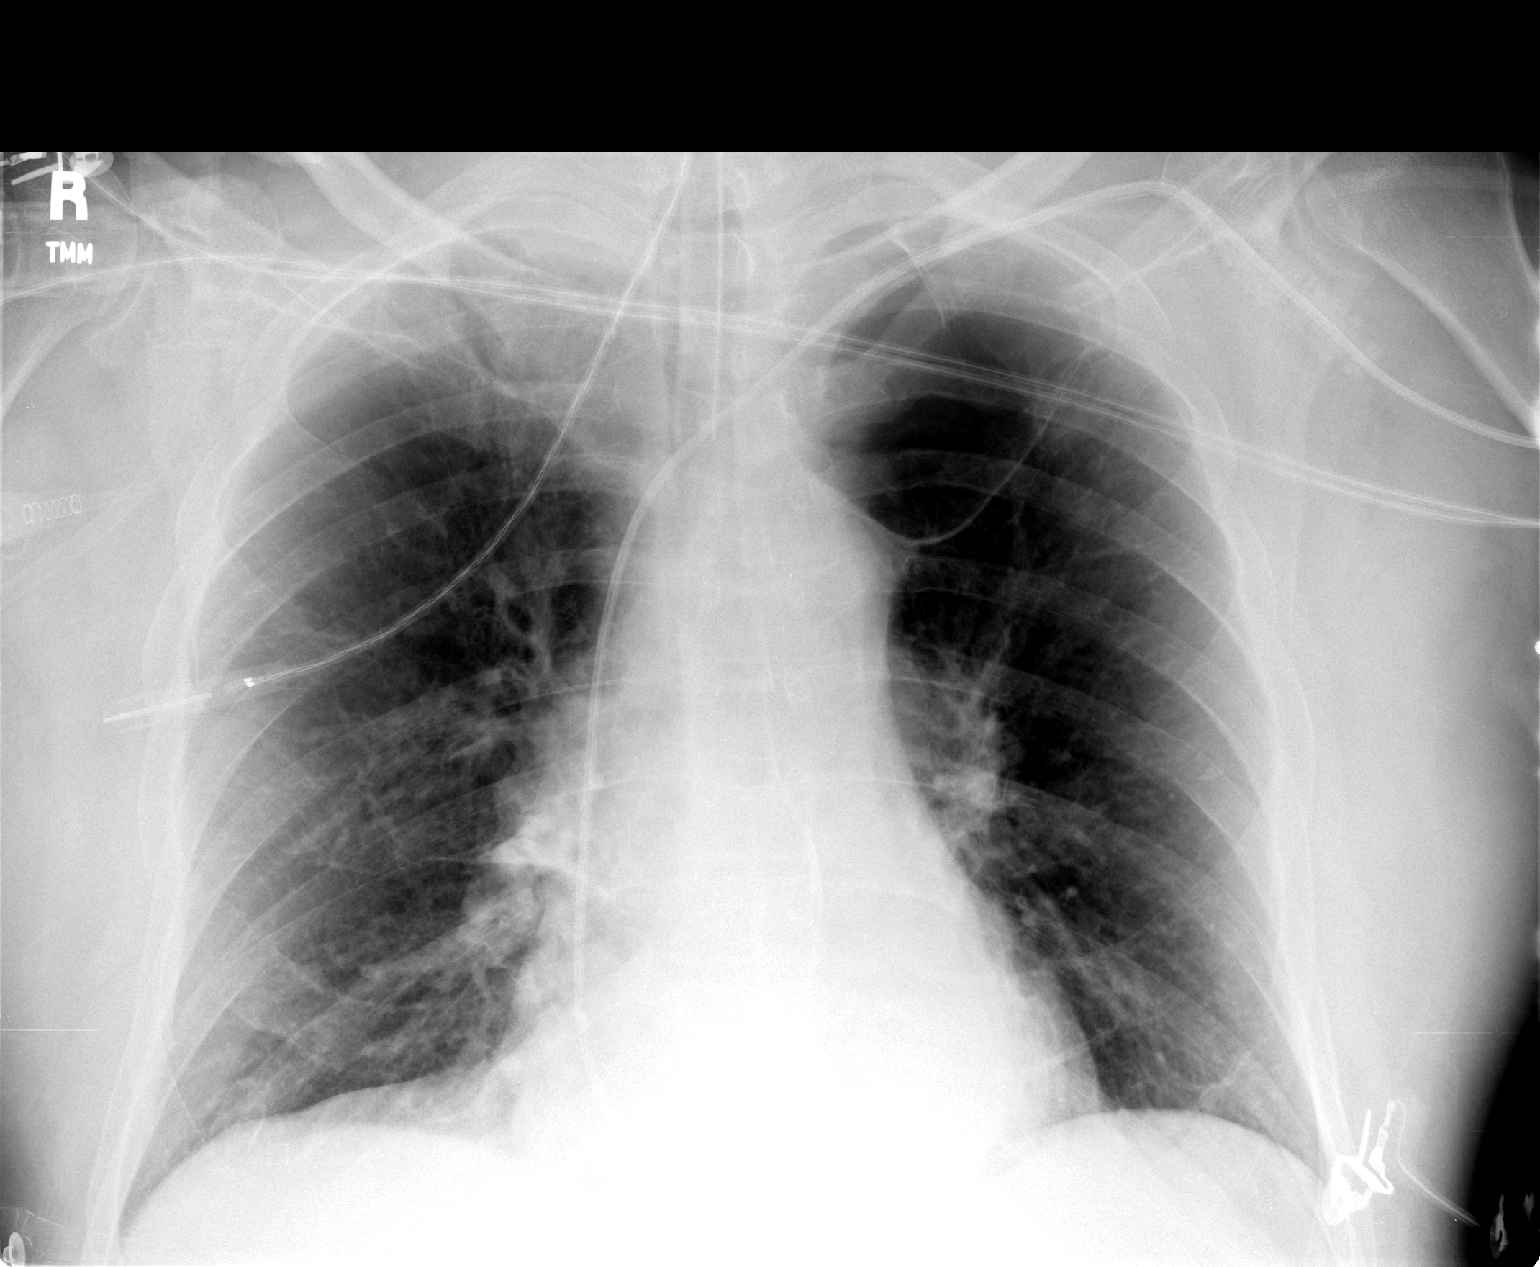

[1 of 1 positions shown; findings below may reference images not displayed]

FINDINGS: Endotracheal tube is in place in good position with the
tip at the level of the clavicular heads.  Left subclavian approach
Swan-Ganz catheter is seen with the tip of the Tiger in the
pulmonary outflow tract.  No pneumothorax.  Large left apical
bullous lesion again noted.  Lungs otherwise clear.  Hiatal hernia
noted.  No effusion.
IMPRESSION: 1.  Support apparatus as described.  No pneumothorax.
2.  No acute disease of the large left apical bulla again seen.
3.    Hiatal hernia.

## 2008-08-07 IMAGING — CT CT PELVIS W/O CM
2 of 5 series · 17 of 46 positions shown, 19 images · IV contrast (agent unspecified)
Comparison: None.

CLINICAL DATA: Chest and abdominal pain. Evaluate for renal stone.
 ABDOMEN CT WITHOUT CONTRAST:
TECHNIQUE: Multidetector CT imaging of the abdomen was performed following the standard protocol without IV contrast.
TECHNIQUE: Multidetector CT imaging of the pelvis was performed following the standard protocol without IV contrast.

[Series 2: >200 lbs-stone 5.0 b31f · axial · 0.79mm/px · z∈[-570,-100]mm · 14 of 104 slices shown, 16 images]
[im 5/104  soft-tissue]
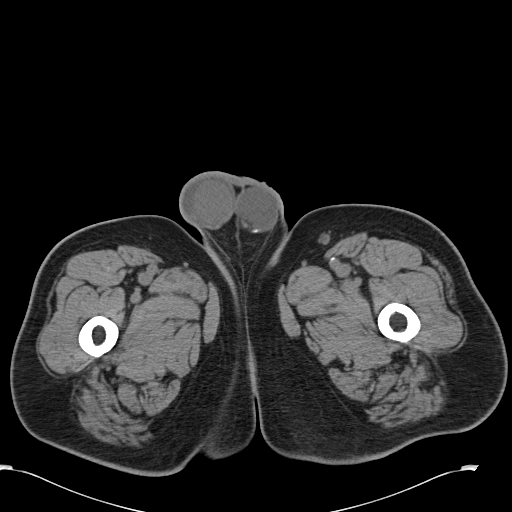
[im 5/104  bone]
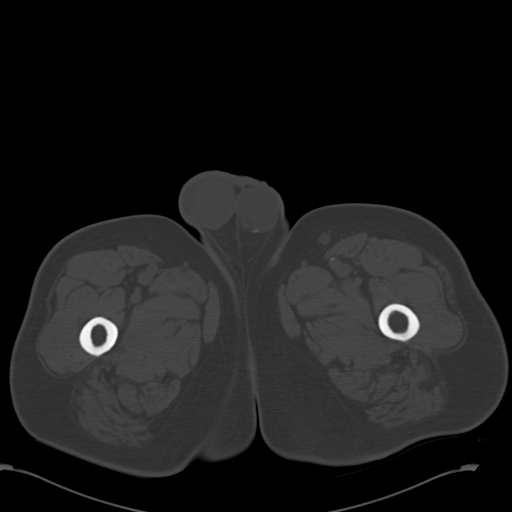
[im 15/104  soft-tissue]
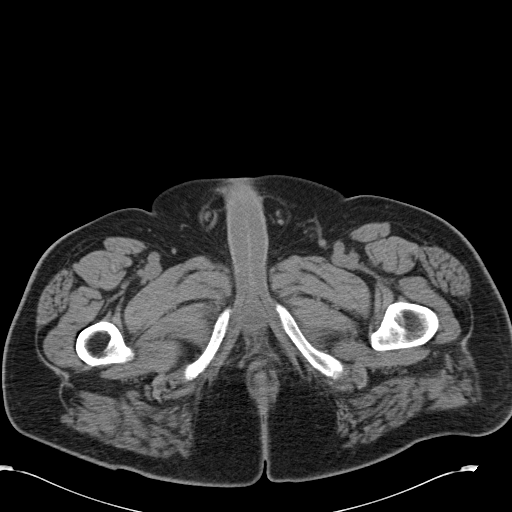
[im 19/104  soft-tissue]
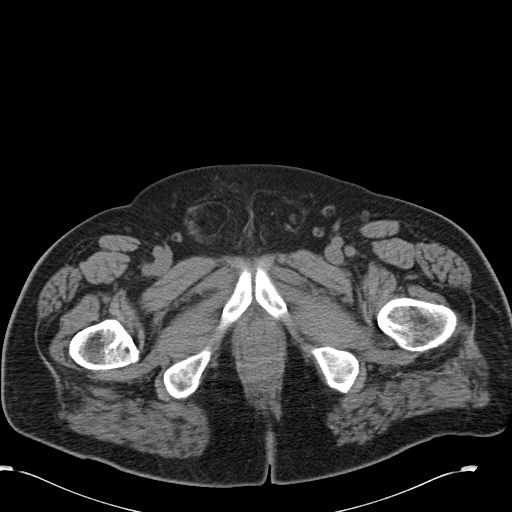
[im 29/104  soft-tissue]
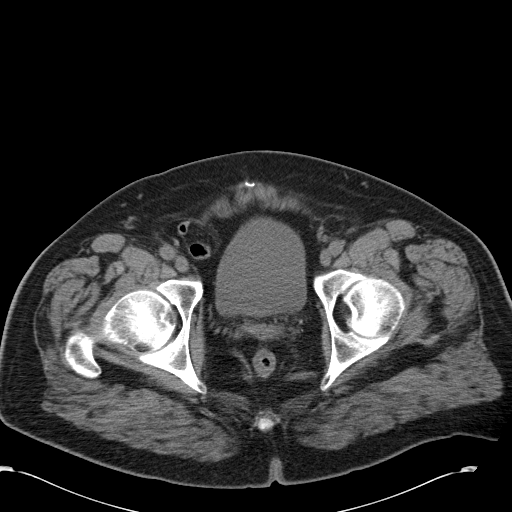
[im 33/104  soft-tissue]
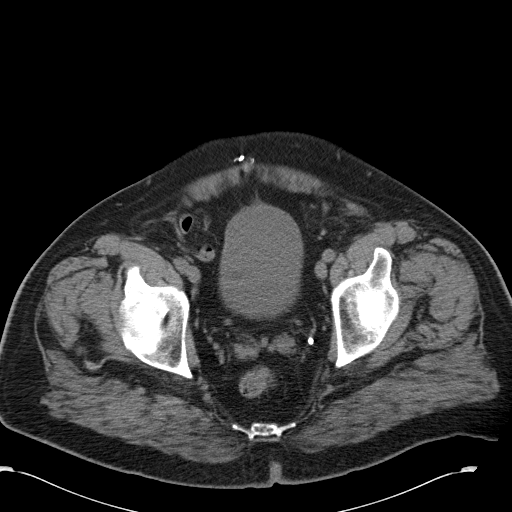
[im 43/104  soft-tissue]
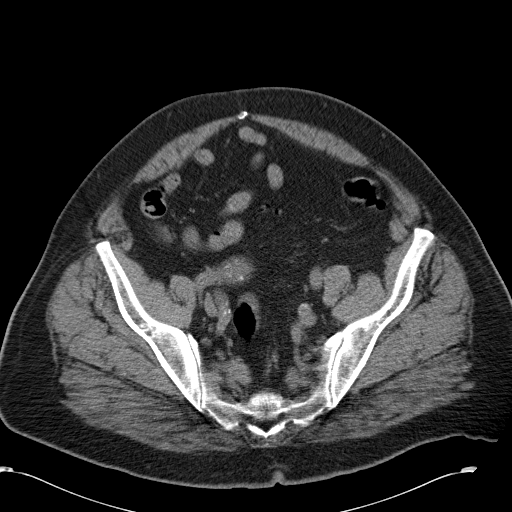
[im 47/104  soft-tissue]
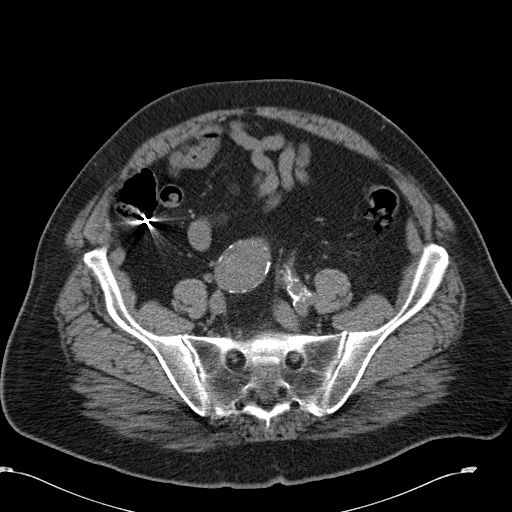
[im 57/104  soft-tissue]
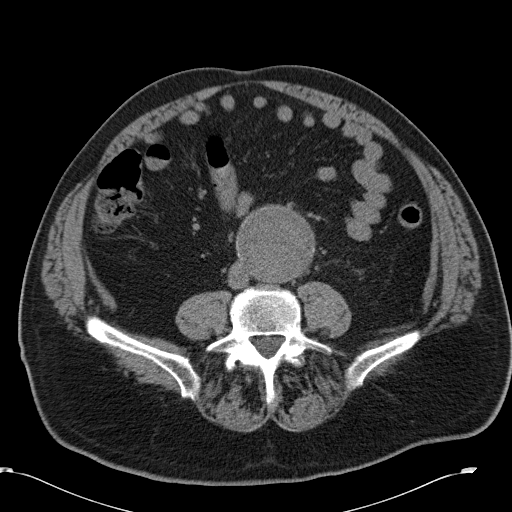
[im 61/104  soft-tissue]
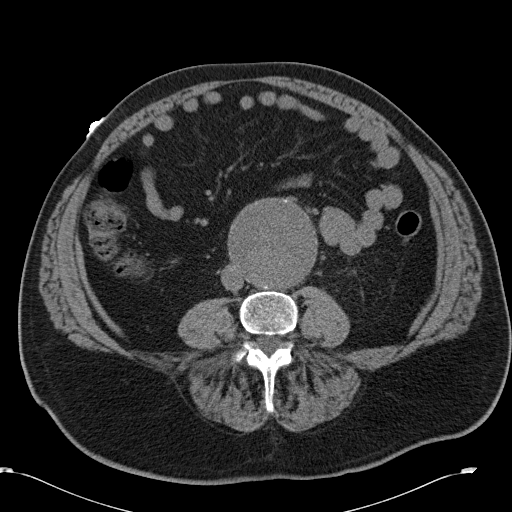
[im 61/104  bone]
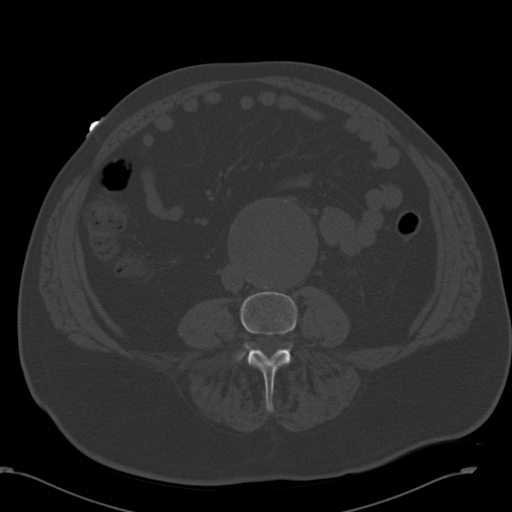
[im 71/104  soft-tissue]
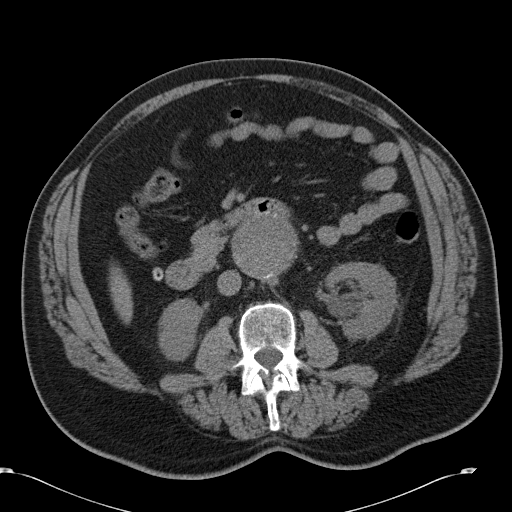
[im 75/104  soft-tissue]
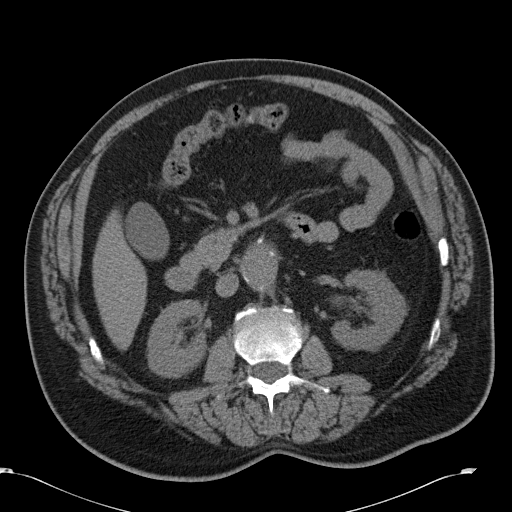
[im 85/104  soft-tissue]
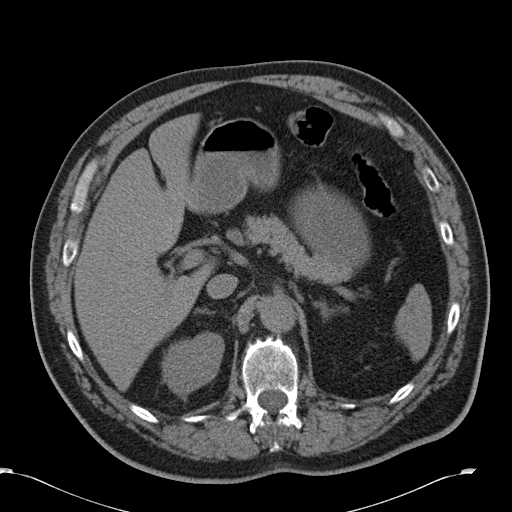
[im 89/104  soft-tissue]
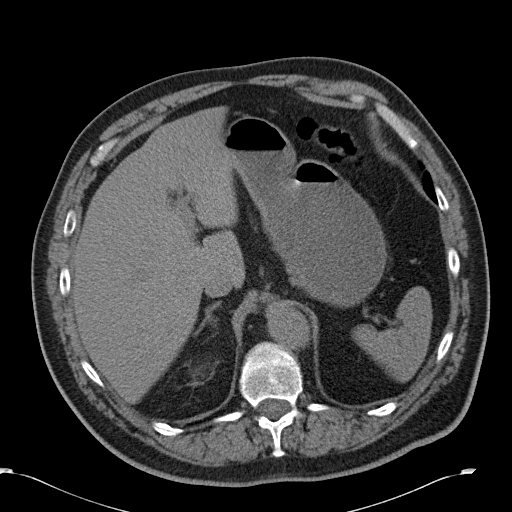
[im 99/104  soft-tissue]
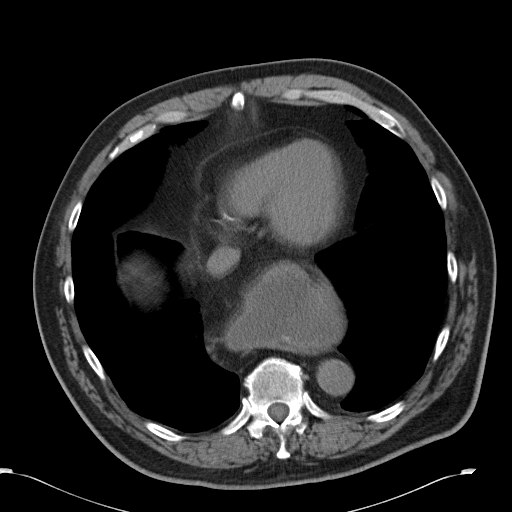

[Series 5: >200 lbs-stone 2.0 spo · coronal · 1.01mm/px · 3 of 159 slices shown]
[im 53/159  soft-tissue]
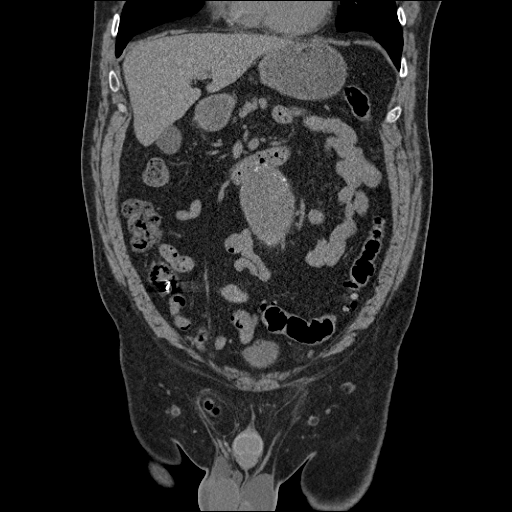
[im 71/159  soft-tissue]
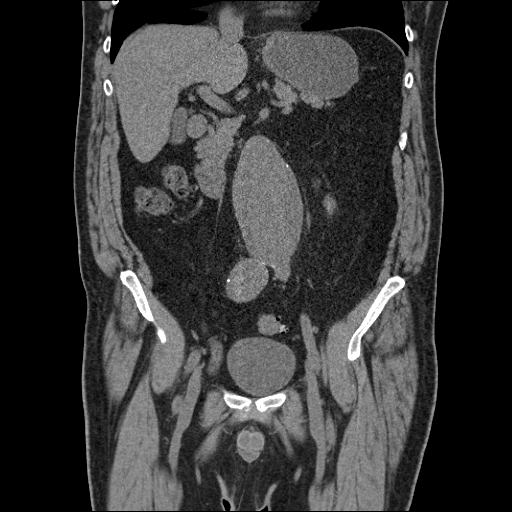
[im 88/159  soft-tissue]
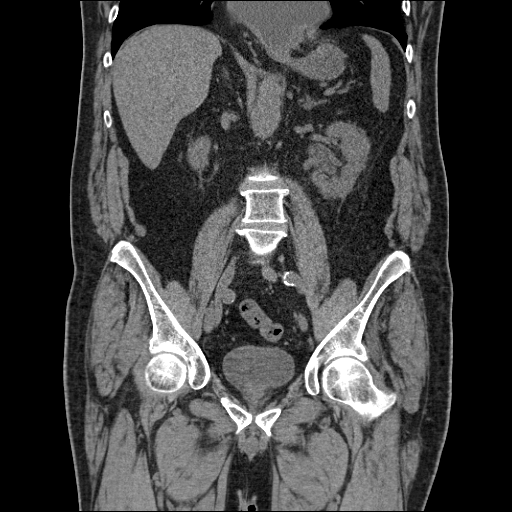

[17 of 46 positions shown; findings below may reference images not displayed]

FINDINGS: The lung bases are clear.  There is a large hiatal hernia.
 Adrenal glands are negative.
 Spleen is negative.
 The liver parenchyma is normal. 
 The gallbladder is normal.
 There is mild bilateral perinephric fat stranding.
 No evidence for renal stones or obstructive uropathy. There is a large infrarenal abdominal aortic aneurysm measuring 7.3 cm, image 42. Negative for aneurysm rupture or leakage.
 Bone windows significant for lumbar spondylosis.
IMPRESSION: 1.  Large infrarenal abdominal aortic aneurysm. This measures 7.3 cm in AP dimension.
 2.  No evidence for acute obstructive uropathy or renal stone.
 3.  Large hiatal hernia.
 PELVIS CT WITHOUT CONTRAST:
FINDINGS: There is aneurysmal dilatation of the right common iliac artery measuring 3.7 cm, image 58.
 Negative for free fluid.
 There is a right inguinal hernia, which contains nonobstructed loops of small bowel.
 Bladder negative.
 There is sigmoid diverticulosis without diverticulitis.
 No free fluid, or abnormal fluid collections.
 Review of the bone windows is unremarkable.
IMPRESSION: 1.  No acute findings.
 2.  Right common iliac artery aneurysm.

## 2008-08-08 IMAGING — CR DG CHEST 1V PORT
1 series · 1 of 1 positions shown · non-contrast
Comparison: 01/04/2008

CLINICAL DATA: Abdominal aortic aneurysm, post-operative.    
 PORTABLE CHEST - 1 VIEW:

[view not recorded]
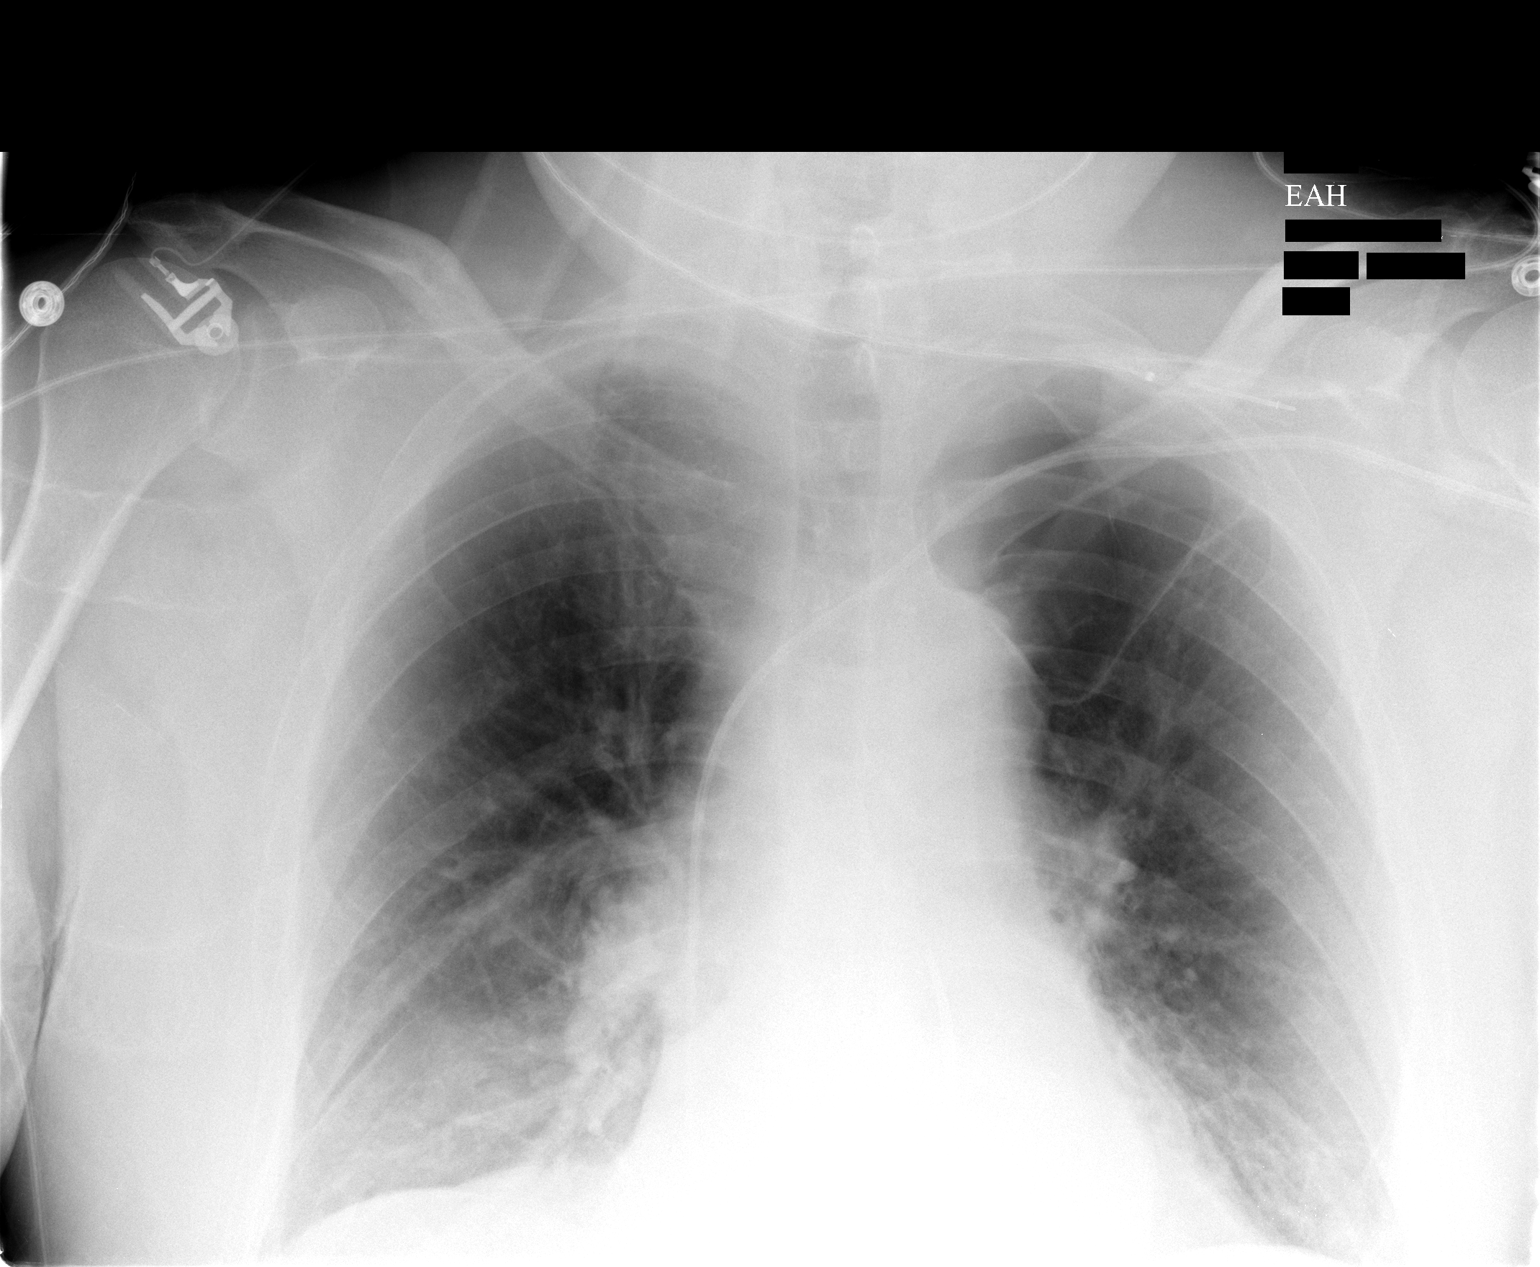

[1 of 1 positions shown; findings below may reference images not displayed]

FINDINGS: Endotracheal tube has been removed.  Left subclavian Swan-Ganz catheter tip over main pulmonary outflow tract.  Costophrenic angles are omitted from the film.  Large left apical bulla again noted.  Minimal bibasilar atelectasis.
IMPRESSION: Minimal bibasilar atelectasis after extubation.

## 2008-08-09 IMAGING — CR DG CHEST 1V PORT
1 series · 1 of 1 positions shown · non-contrast
Comparison: none

CLINICAL DATA: Status-post intubation.  Evaluate tube. 
 PORTABLE CHEST ? 1 VIEW ([DATE] HOURS):

[view not recorded]
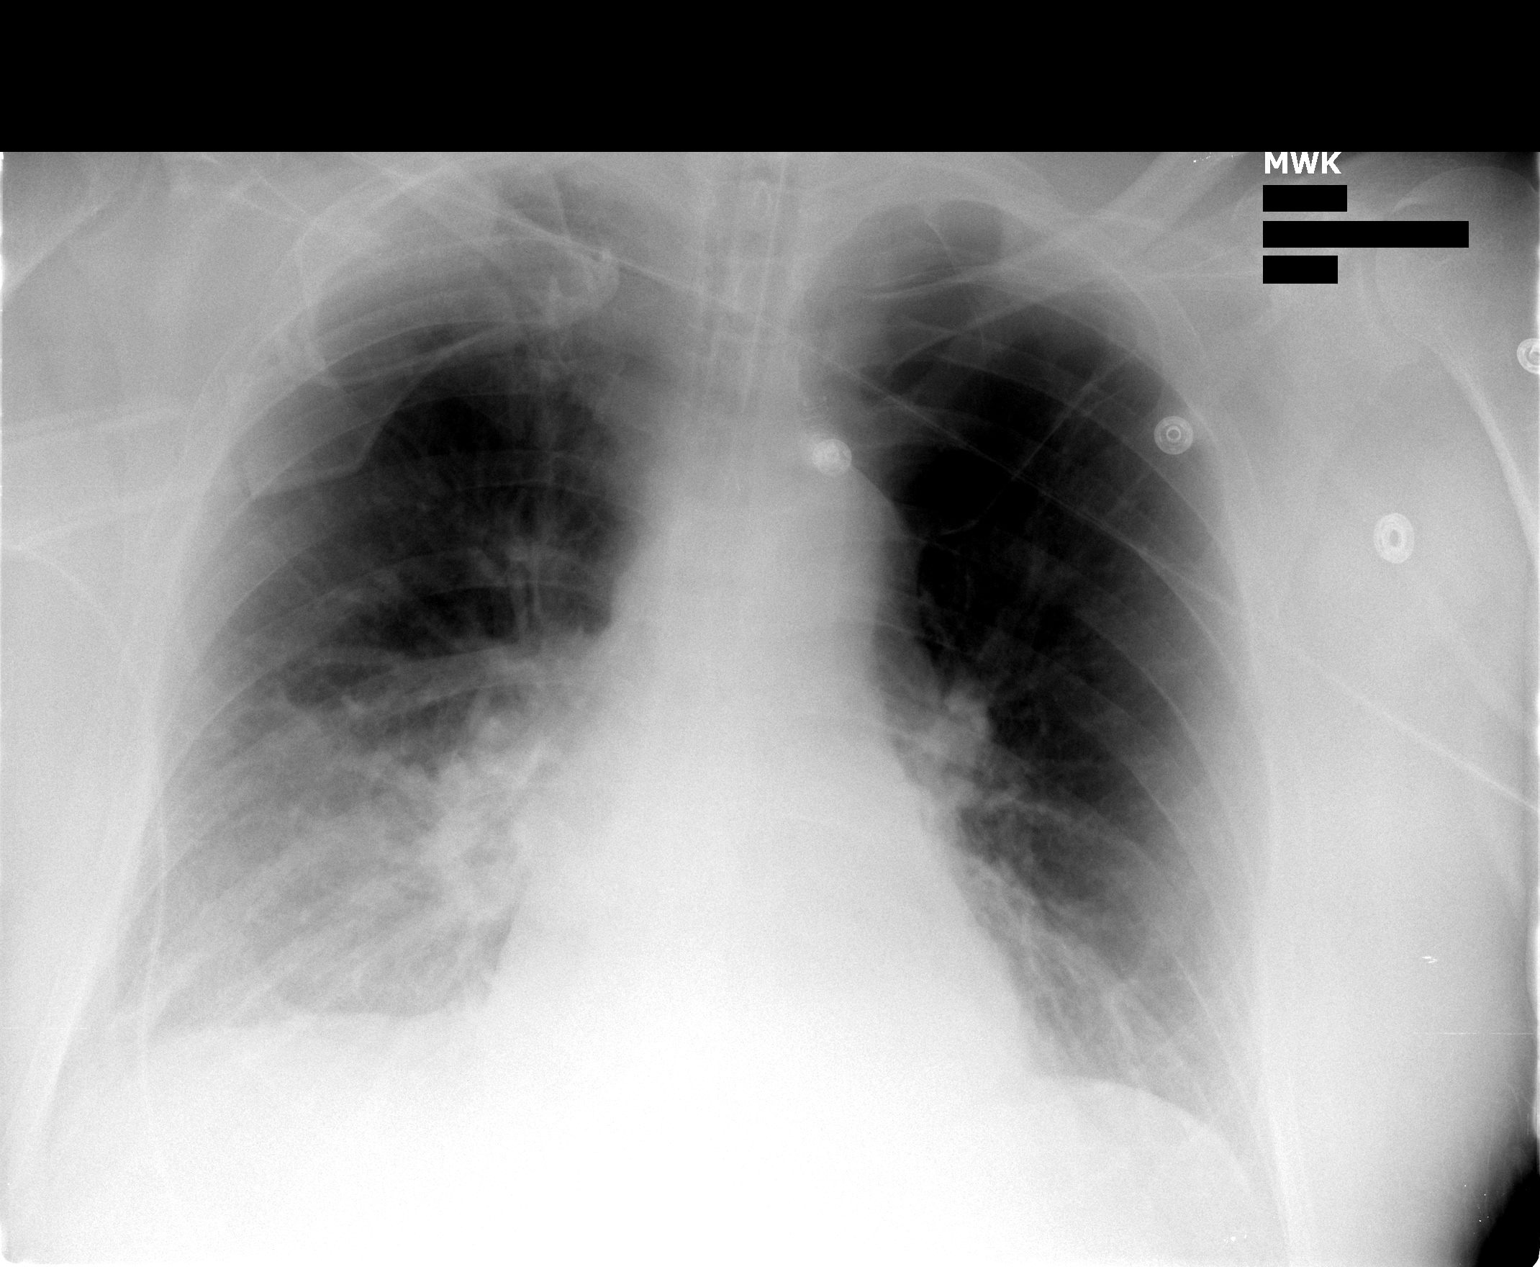

[1 of 1 positions shown; findings below may reference images not displayed]

FINDINGS: Endotracheal tube has been introduced and the tip of the tube appears 2.5 cm from the carina.  The lungs demonstrate bibasilar airspace opacity, greater on the right than left, that suggests pulmonary edema.
IMPRESSION: Interval intubation with endotracheal tube tip 2.5 cm from carina.

## 2008-08-09 IMAGING — CR DG CHEST 1V PORT
1 series · 1 of 1 positions shown · non-contrast
Comparison: 01/05/08

CLINICAL DATA: Repair of AAA, shortness of breath.
 PORTABLE CHEST ? 1 VIEW ? 2284 hours:

[view not recorded]
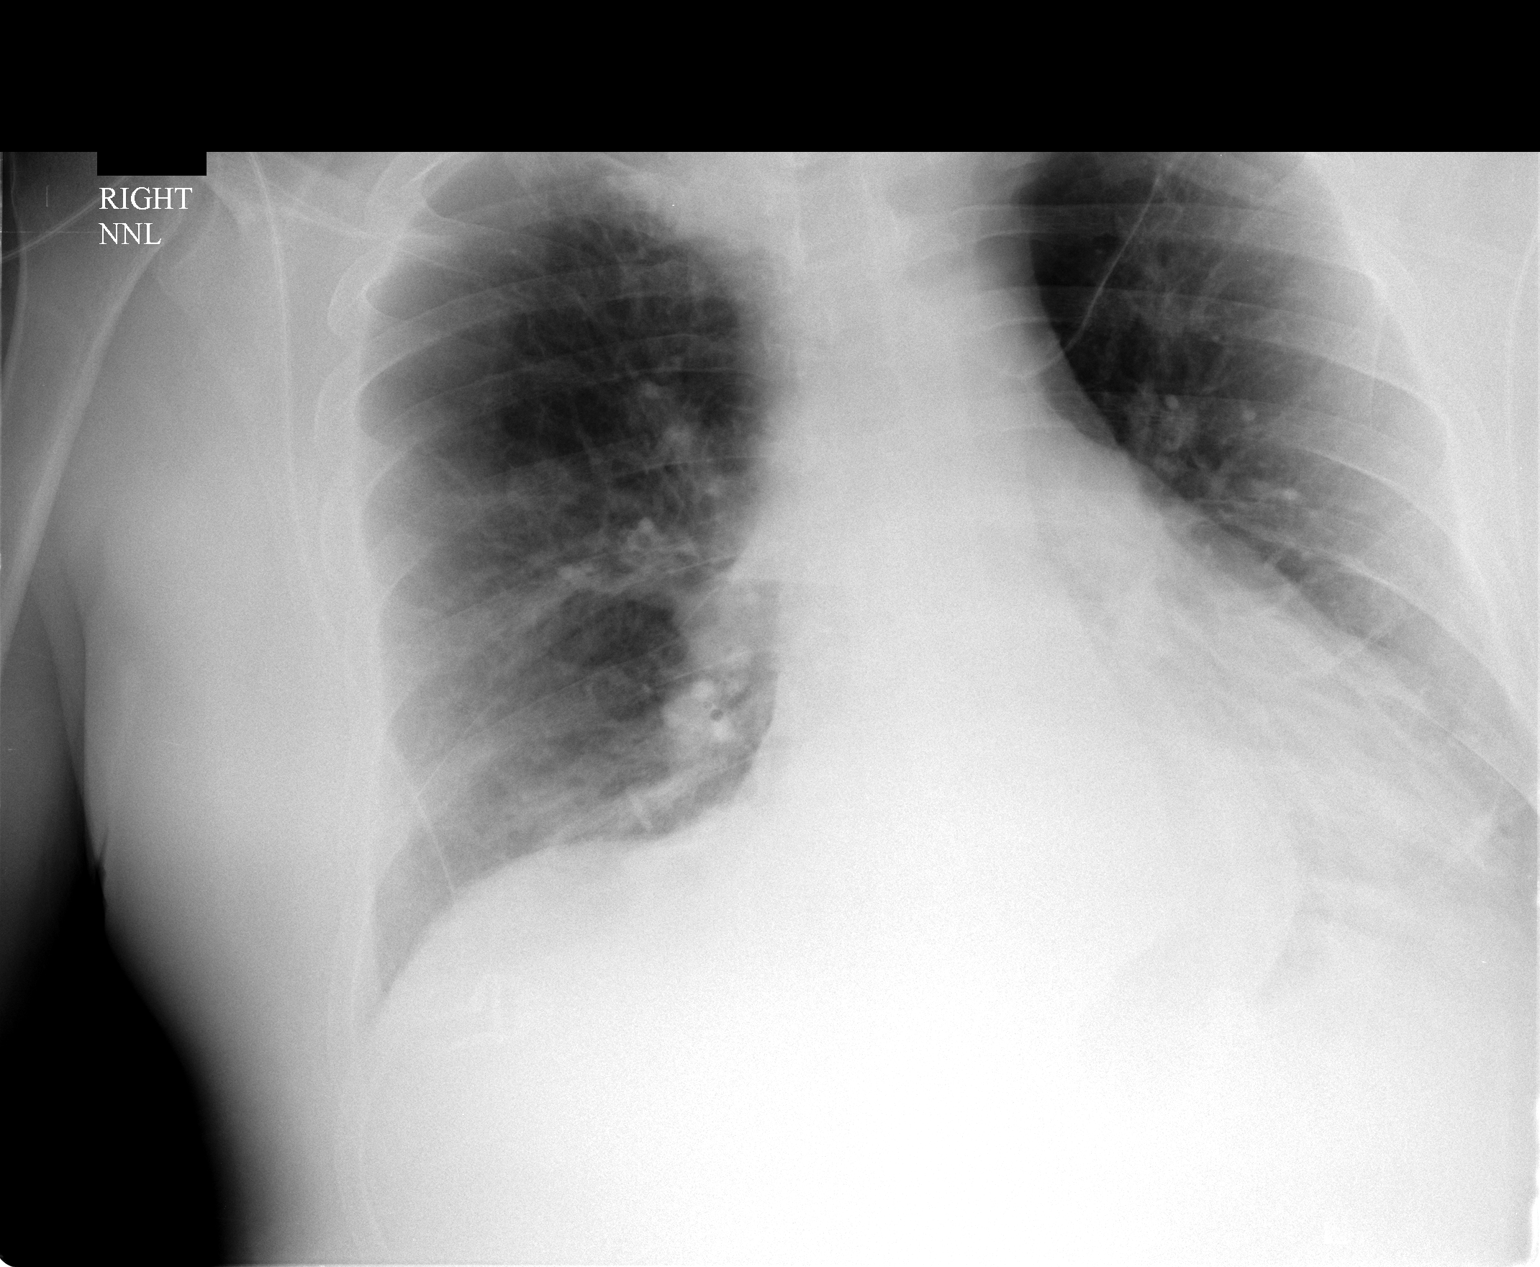

[1 of 1 positions shown; findings below may reference images not displayed]

FINDINGS: Mild basilar atelectasis remains.  Heart size is stable.  Left central venous line has been removed.
IMPRESSION: Mild basilar atelectasis.  Left central venous line removed.

## 2008-08-11 IMAGING — CR DG CHEST 1V PORT
1 series · 1 of 1 positions shown · non-contrast
Comparison: [HOSPITAL] two view chest x-ray, 07/28/06; portable chest x-ray, 01/06/08 at 9889 hours and 01/06/08 at 3064 hours.

CLINICAL DATA: AAA.  Ventilator study.
 PORTABLE CHEST ? 01/08/08 AT 9251 HOURS:

[AP]
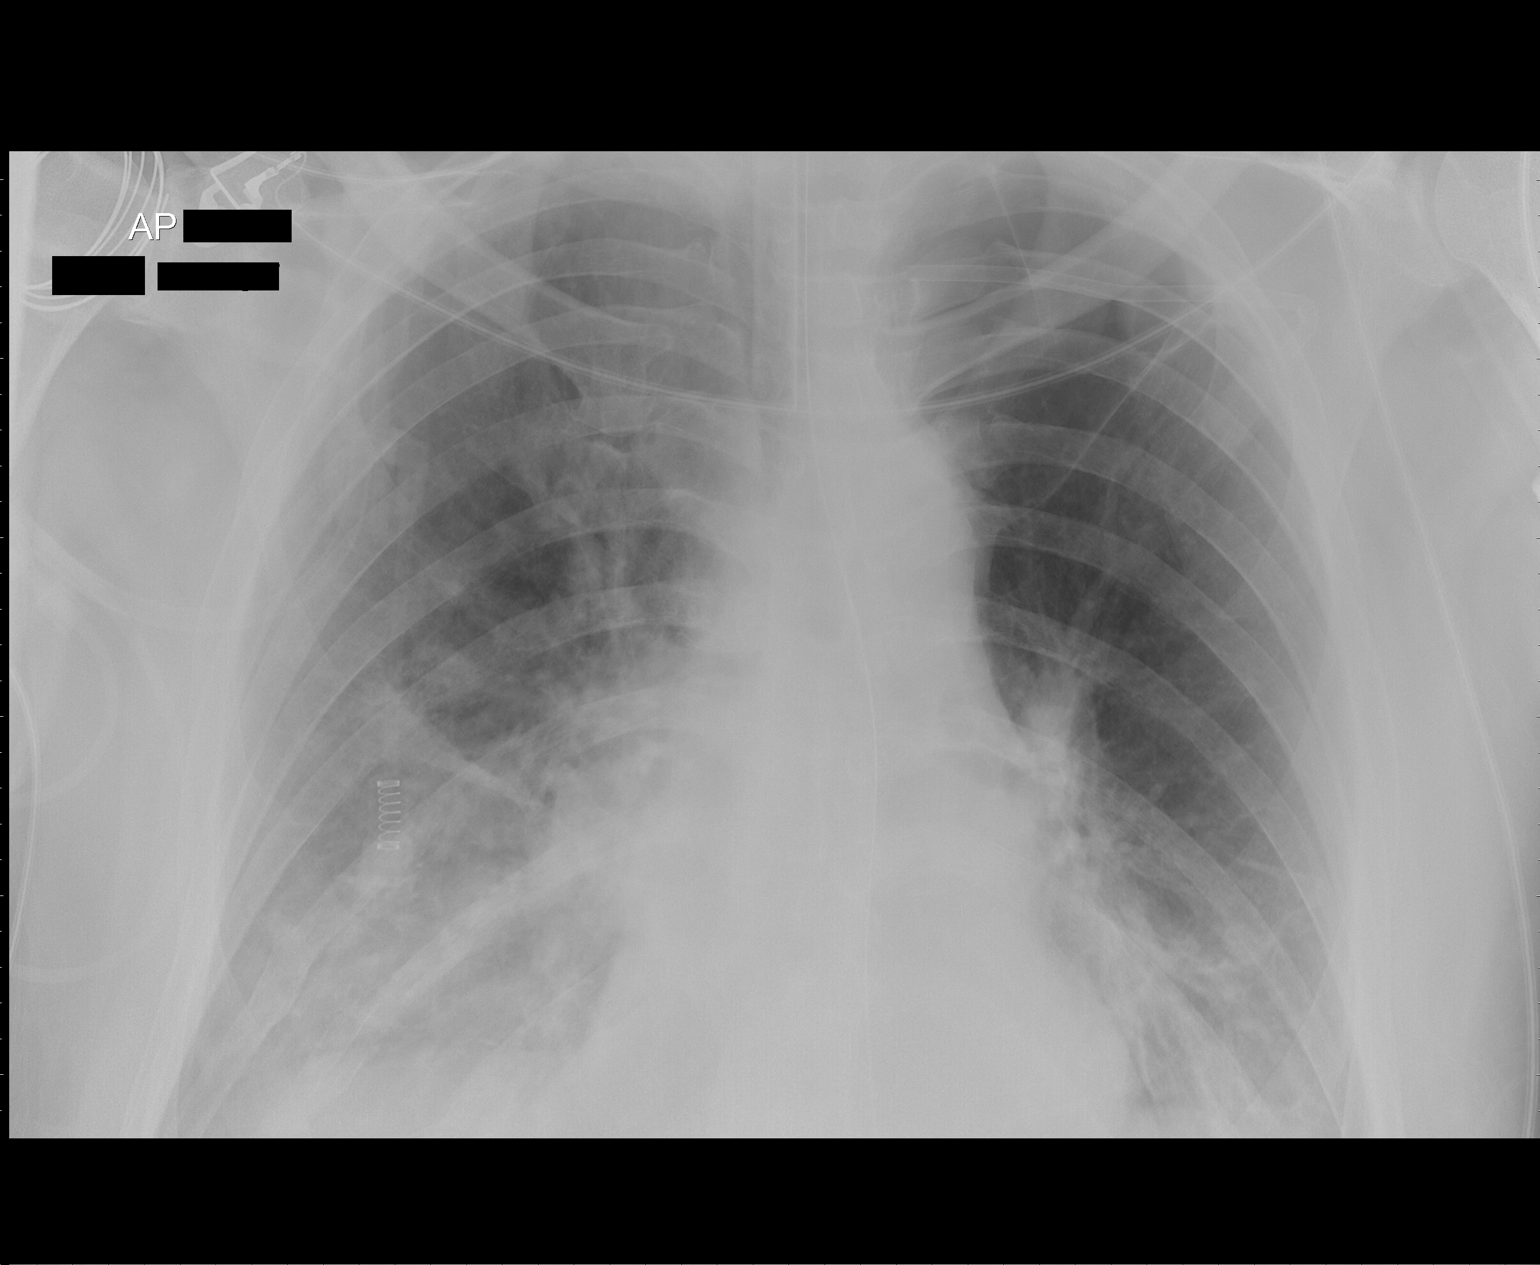

[1 of 1 positions shown; findings below may reference images not displayed]

FINDINGS: Endotracheal tube is seen 6 cm superior to carina.  Nasogastric tube is present.  Since prior study, slightly asymmetric probable pulmonary edema is superimposed upon emphysema with heart size normal.
IMPRESSION: 1.  Interval asymmetric, right greater than left, slight to moderate pulmonary edema.
 2.  Stable support apparatus.
 3.  Less demonstrable COPD.

## 2008-08-12 IMAGING — CR DG CHEST 1V PORT
2 series · 2 of 2 positions shown · non-contrast
Comparison: 01/08/2008

CLINICAL DATA: F/U;

PORTABLE CHEST - 1 VIEW

[AP (1 of 2)]
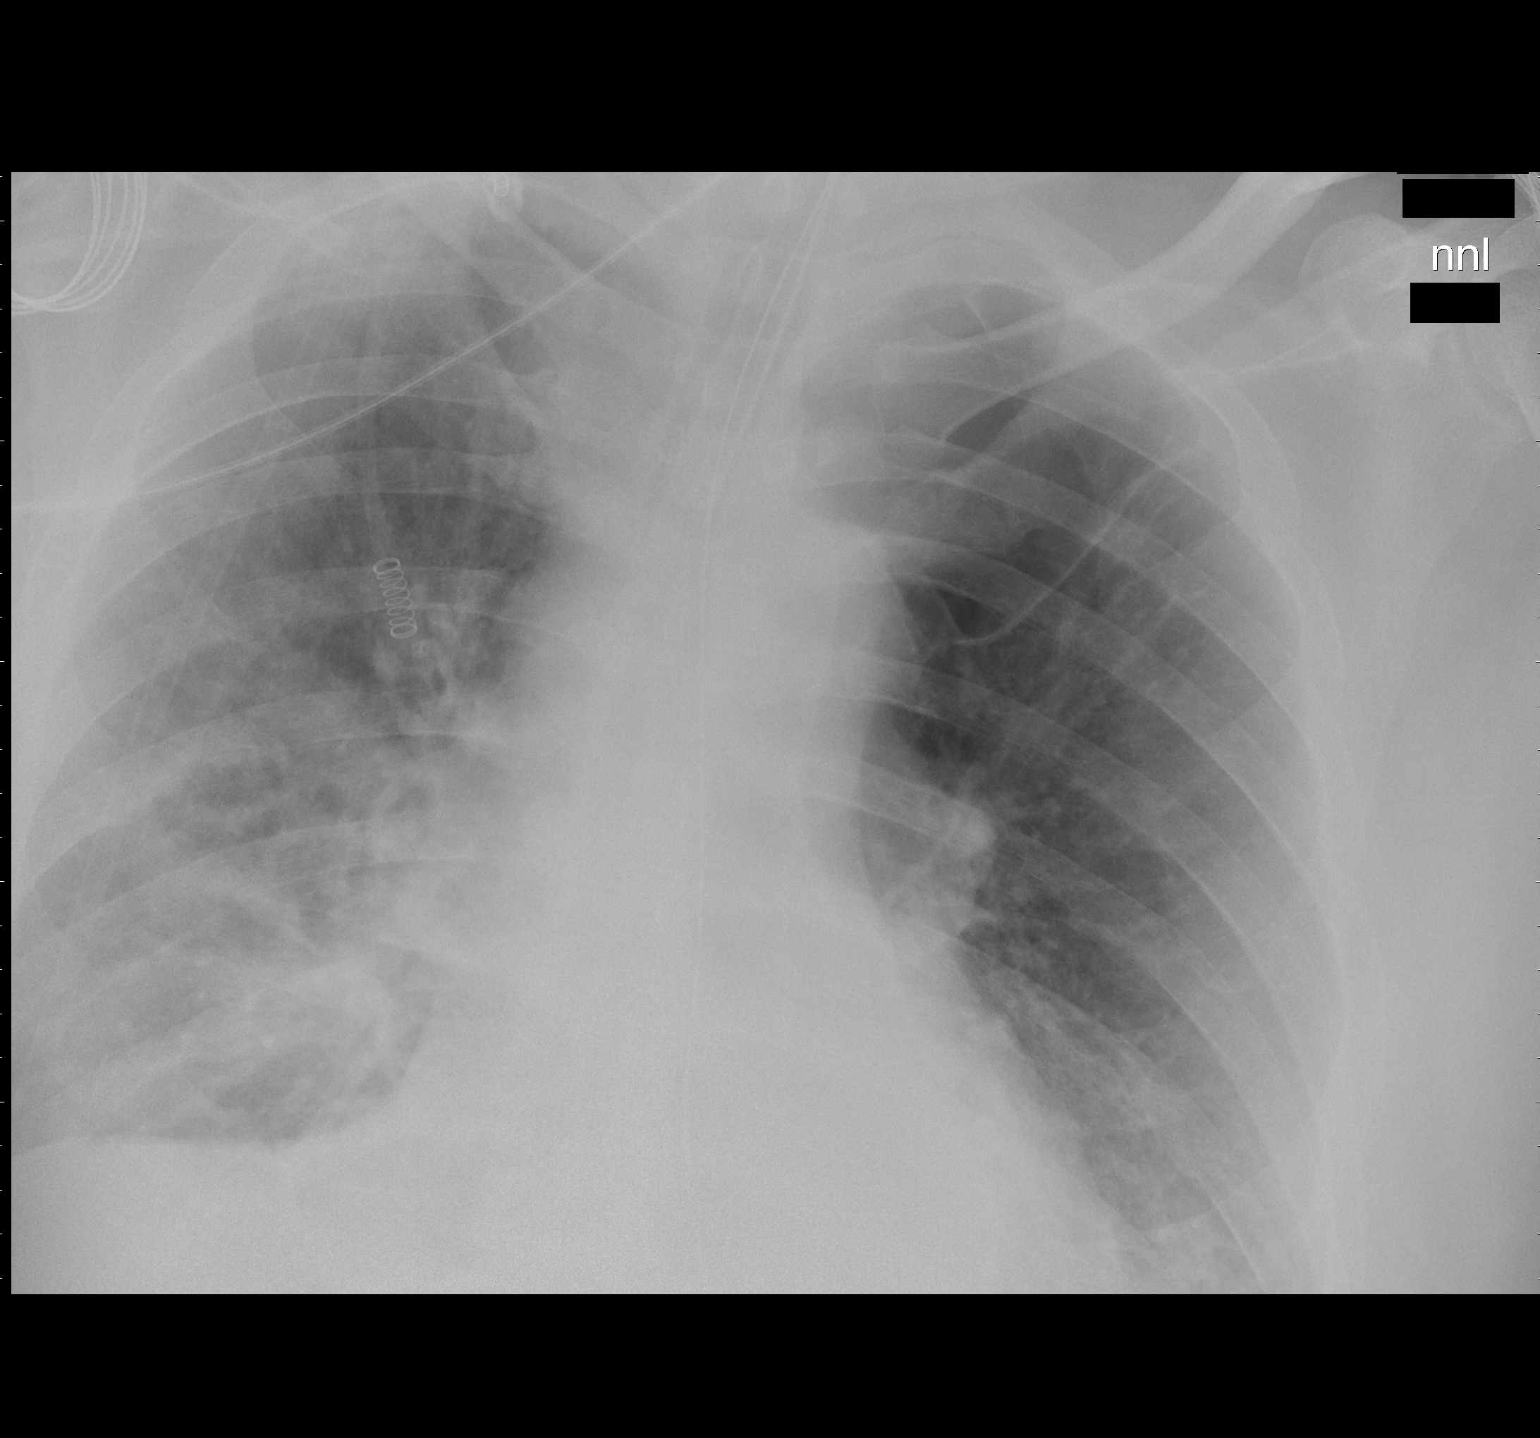

[AP (2 of 2)]
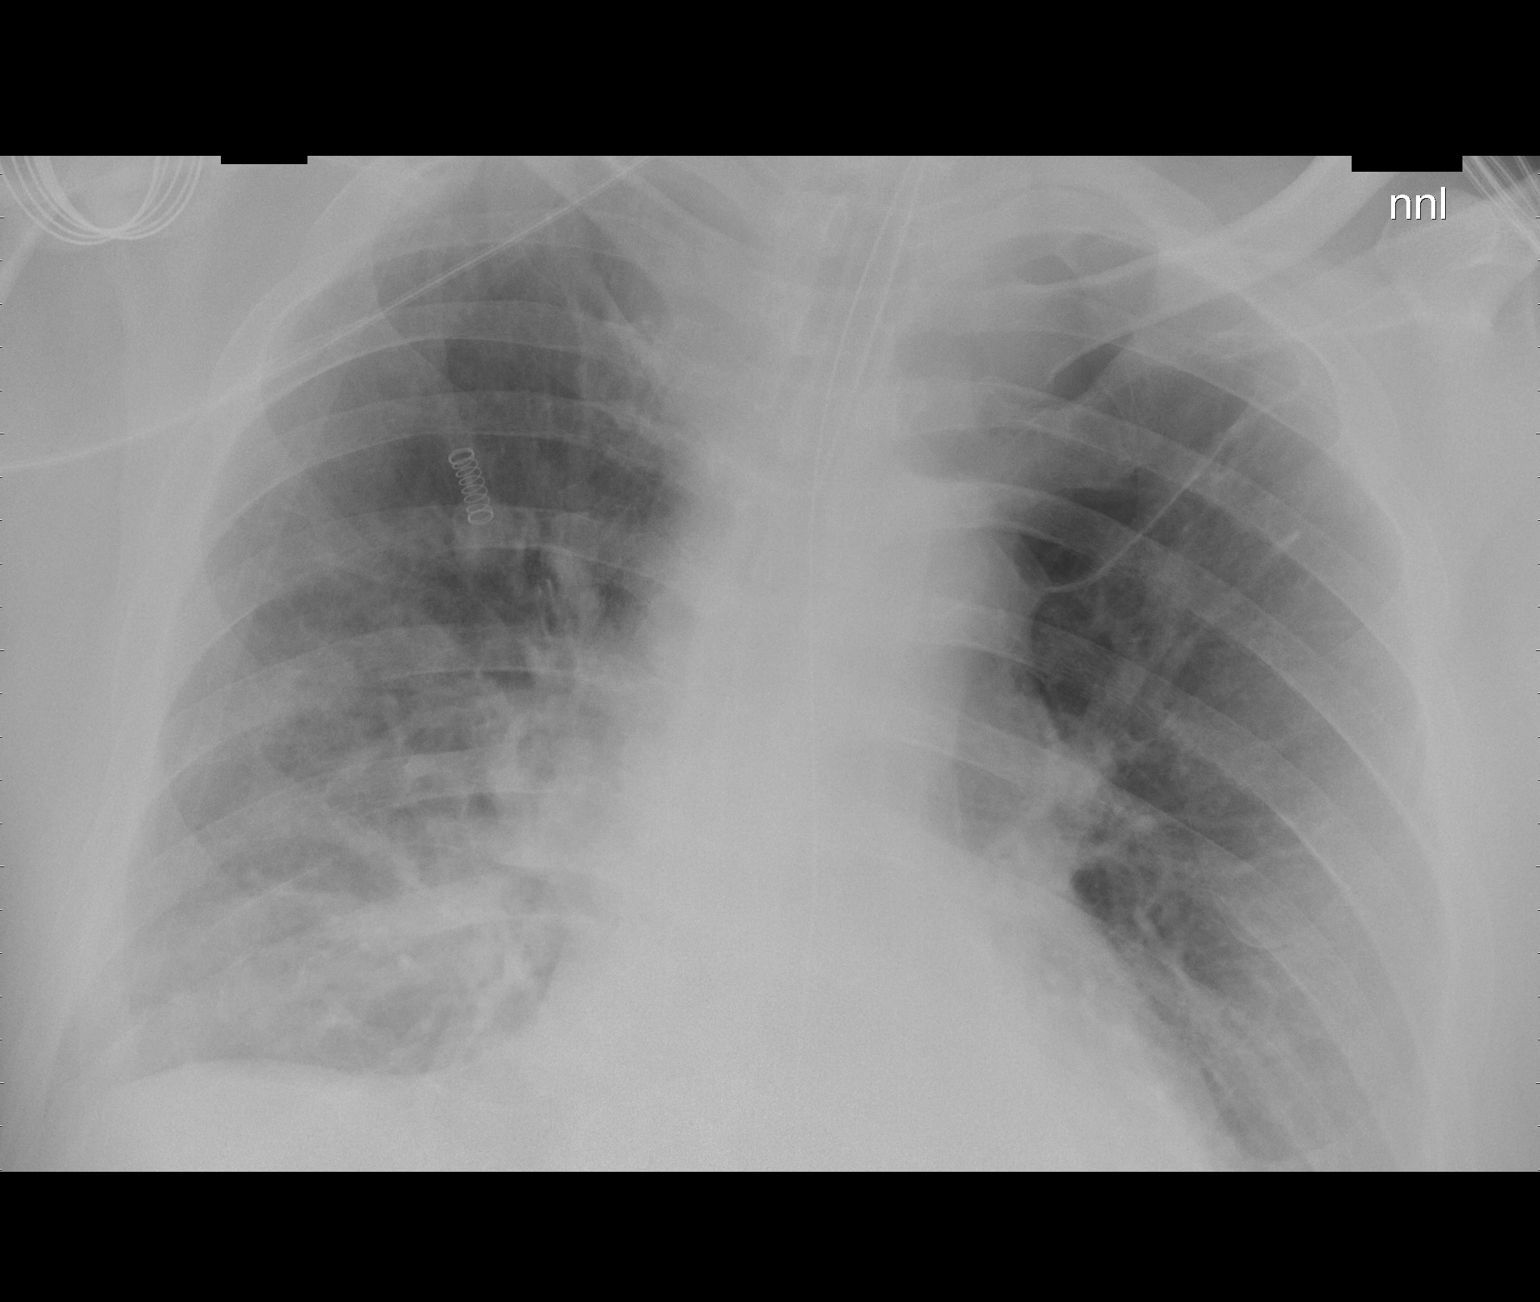

[2 of 2 positions shown; findings below may reference images not displayed]

FINDINGS: The endotracheal and NG tubes are within normal limits.
Bullous changes at the left apex are stable.  Patchy densities at
both lung bases are improved.  No pneumothorax.
IMPRESSION: Improved bilateral predominately central and basilar airspace
disease.

## 2008-08-13 IMAGING — CR DG ABD PORTABLE 1V
1 series · 1 of 1 positions shown · non-contrast
Comparison: None

CLINICAL DATA: PAIN AND TUBE PLACEMENT.  STENT GRAFT PLACED FOR
ABDOMINAL AORTIC ANEURYSM

ABDOMEN - 1 VIEW

[AP]
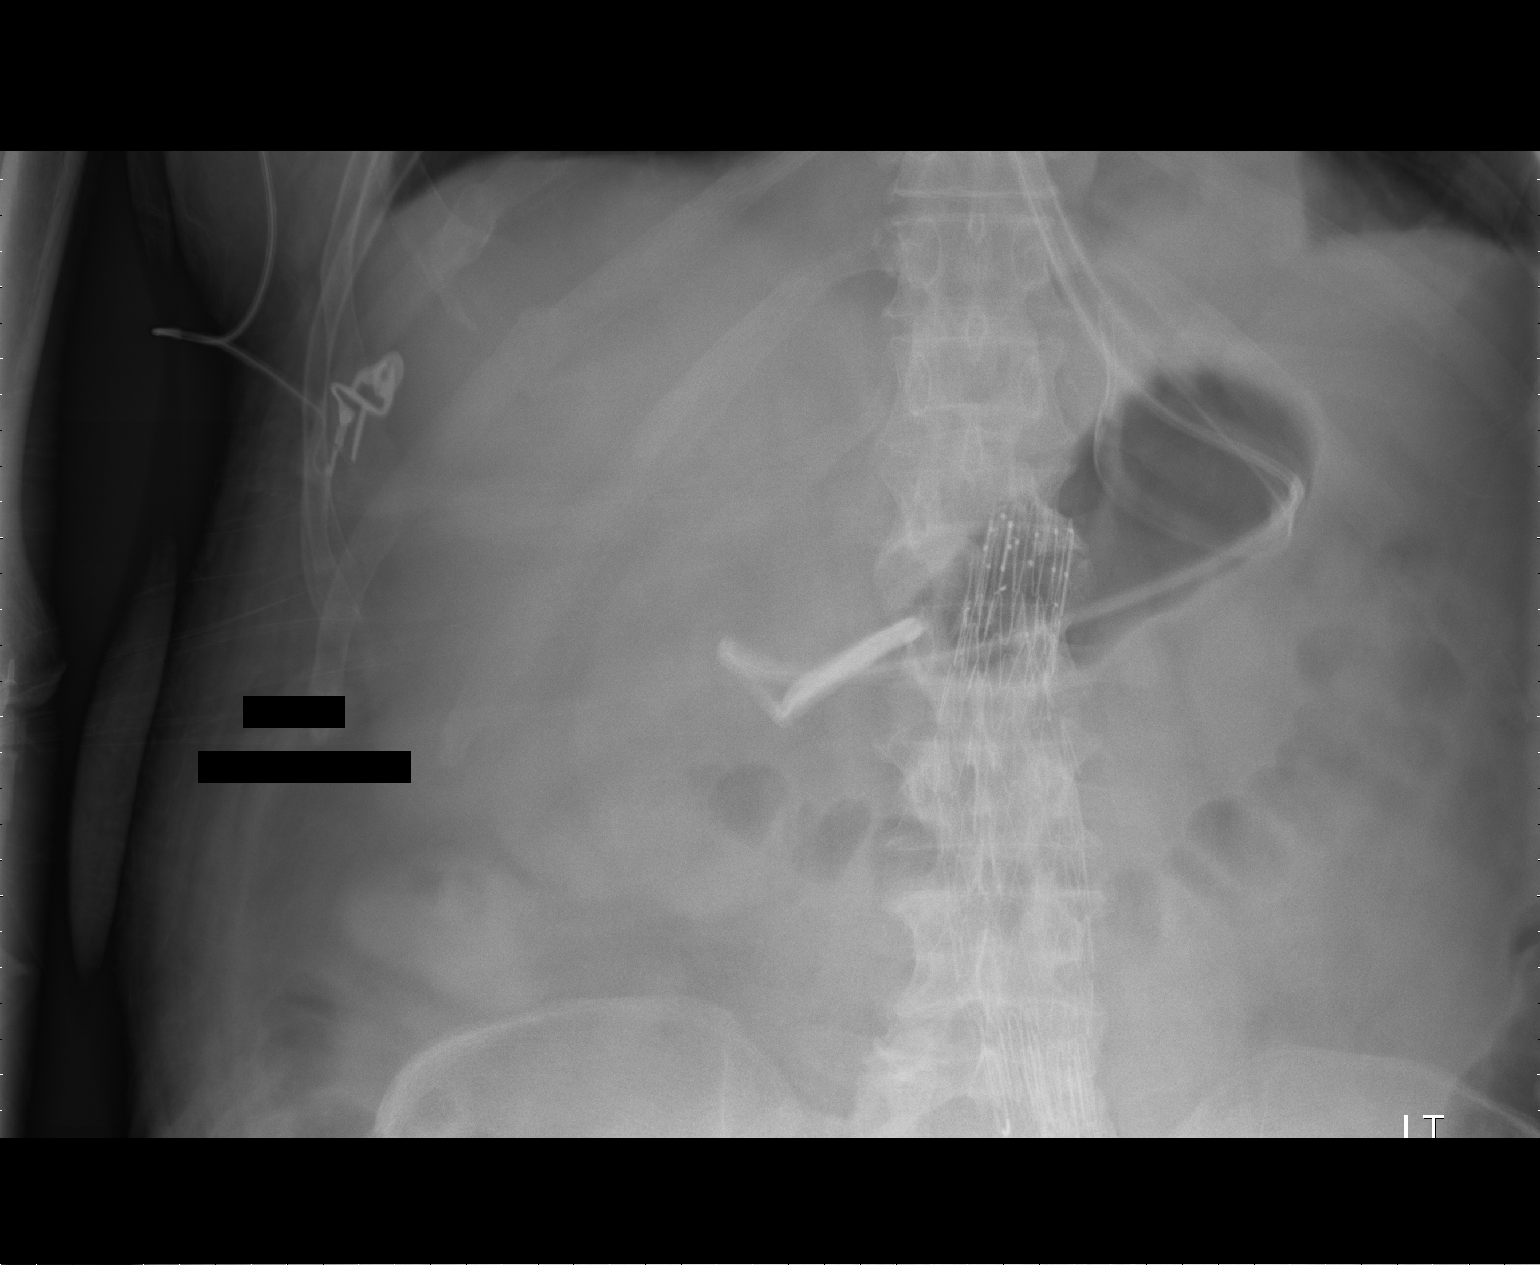

[1 of 1 positions shown; findings below may reference images not displayed]

FINDINGS: NG tube is looped in the fundus of the stomach.  Panda
tube is looped in the distal stomach with the tip directed back
toward the fundus.  The visualized bowel gas pattern is normal.
IMPRESSION: The Panda tube tip extends into the distal stomach with the tip
directed back toward the fundus.  NG tube tip is directed back into
the hiatal hernia.

## 2008-08-13 IMAGING — CR DG CHEST 1V PORT
1 series · 1 of 1 positions shown · non-contrast
Comparison: 01/09/2007

CLINICAL DATA: F/U.

PORTABLE CHEST - 1 VIEW

[AP]
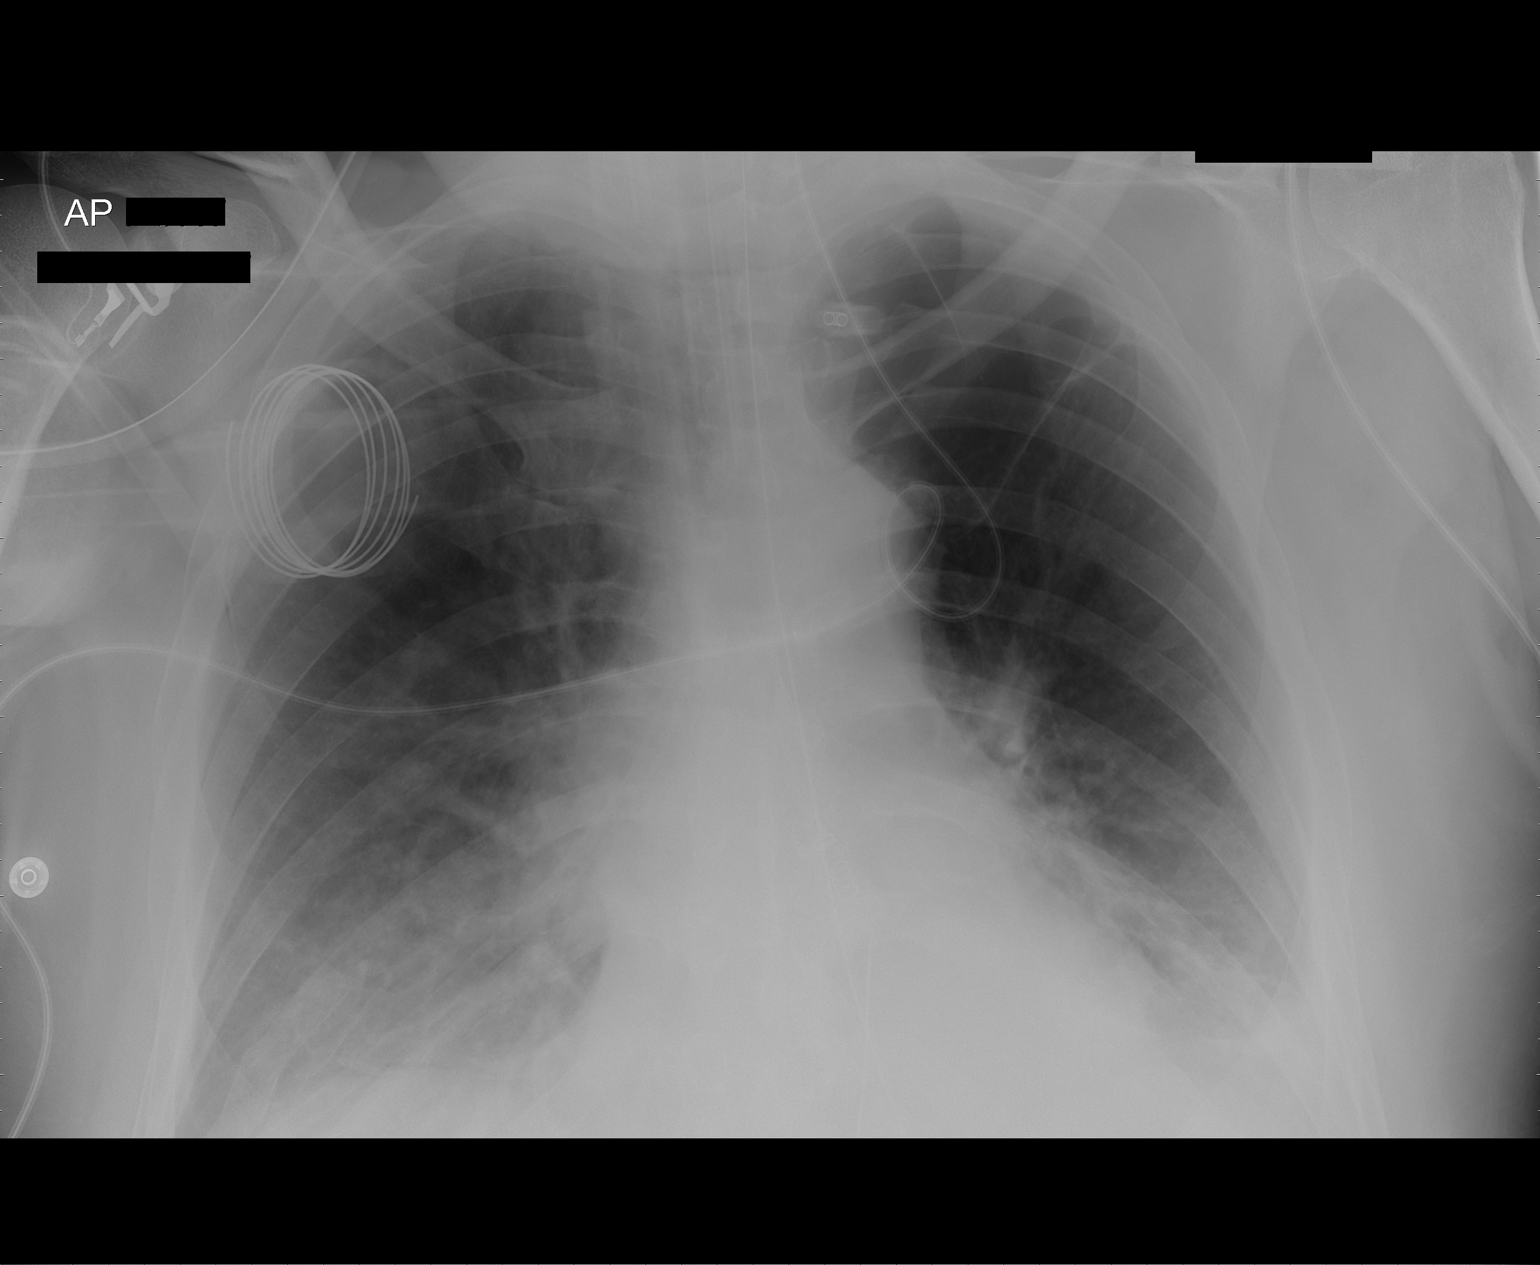

[1 of 1 positions shown; findings below may reference images not displayed]

FINDINGS: Portable view of the chest again demonstrates an
endotracheal tube and nasogastric tube.  The nasogastric tube
appears to be coiled in the stomach but the tip overlies the region
of the distal esophagus.  The exact location of the nasogastric
tube is uncertain.  Persistent bibasilar densities suggestive for
atelectasis and probably some pleural fluid.  Lucency in the left
upper lung probably represents underlying emphysematous change.
Old fracture in a right lower rib.
IMPRESSION: Bibasilar densities probably representing a combination of
atelectasis, consolidation and some pleural fluid..

Nasogastric tip projects over the distal esophagus as described.
The nasogastric tube position was discussed with the patient's
nurse at the time of interpretation.

## 2008-08-13 IMAGING — CR DG CHEST 1V PORT
1 series · 1 of 1 positions shown · non-contrast
Comparison: earlier film of the same day

CLINICAL DATA: Aortic aneurysm, PICC placement

PORTABLE CHEST - 1 VIEW

[view not recorded]
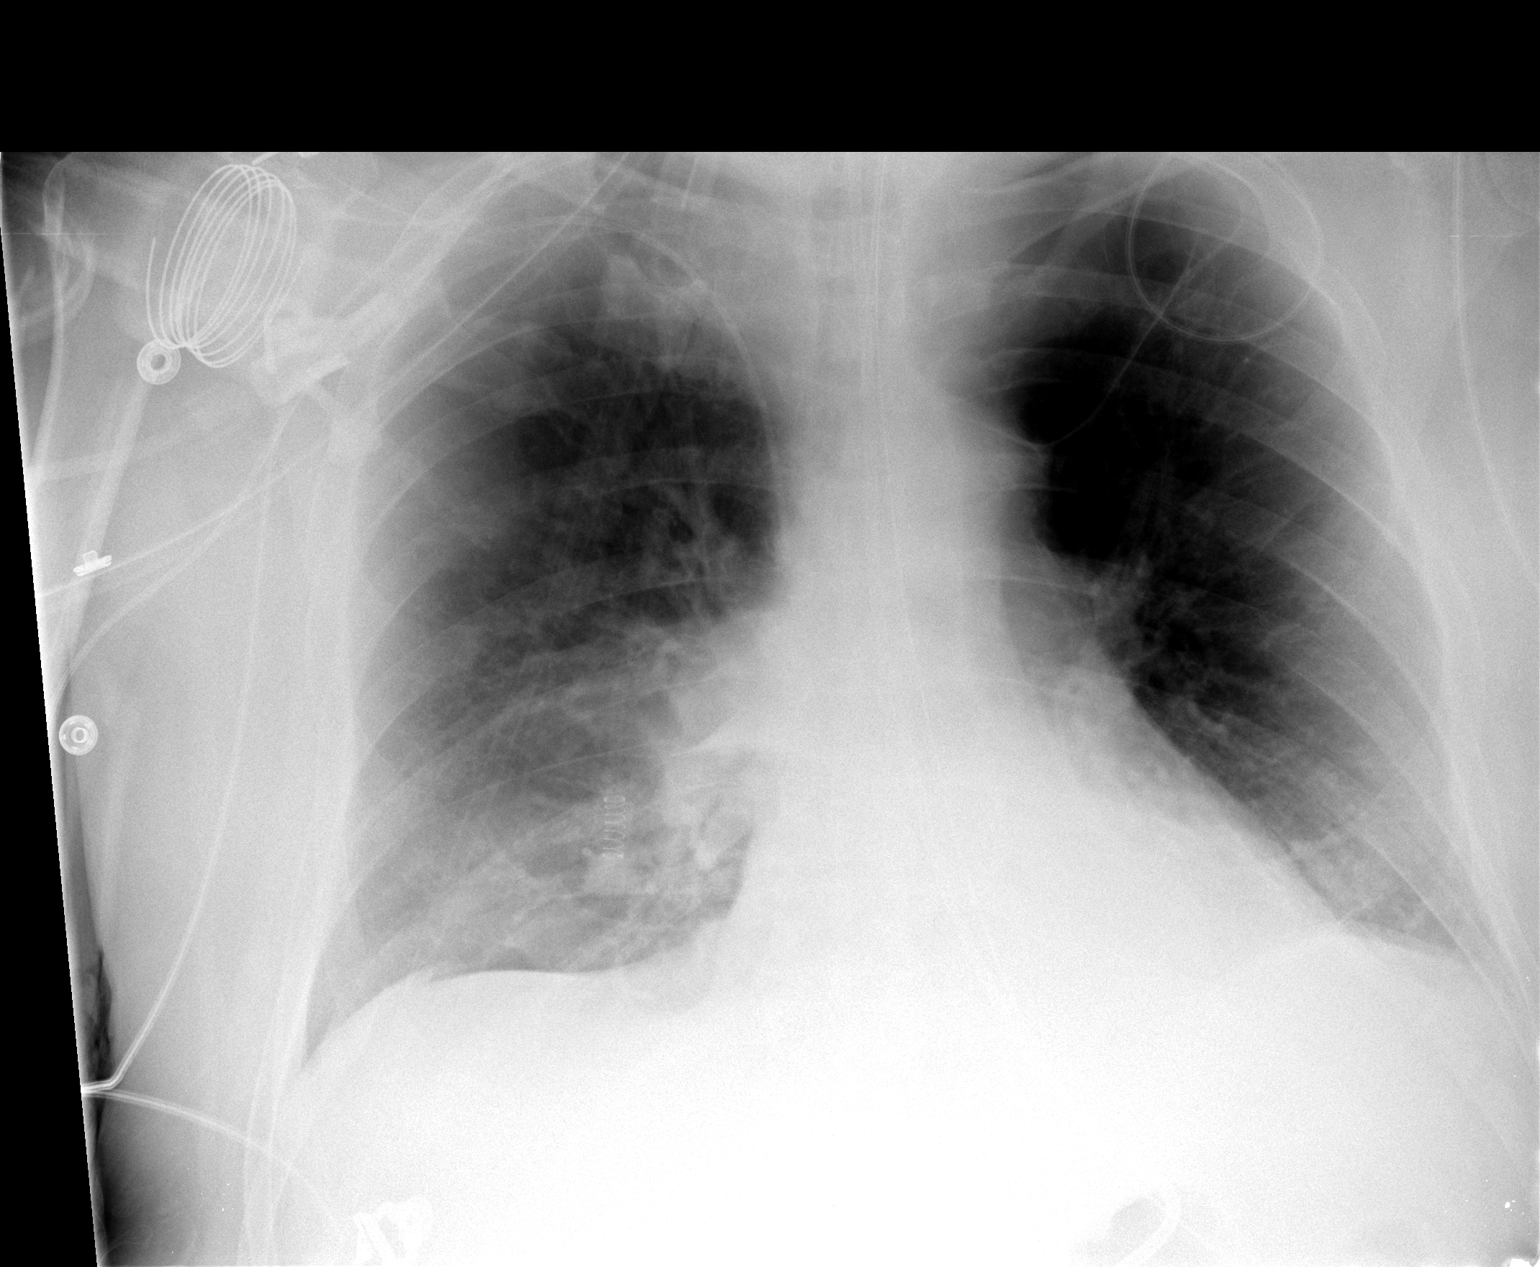

[1 of 1 positions shown; findings below may reference images not displayed]

FINDINGS: Interval placement of a right arm PICC line, tip proximal
SVC.  Endotracheal tube, feeding tube, nasogastric tube remain in
place.  Improved aeration of the lung bases with mild residual
patchy infiltrates or atelectasis, left greater than right.  Heart
size normal.
IMPRESSION: 1.  PICC to proximal SVC.
2.  Improved bibasilar aeration with residual
atelectasis/infiltrate, left greater than right

## 2008-08-15 IMAGING — CR DG CHEST 1V PORT
1 series · 1 of 1 positions shown · non-contrast
Comparison: Portable chest 01/10/2008.

CLINICAL DATA: Abdominal aortic aneurysm.

PORTABLE CHEST - 1 VIEW

[view not recorded]
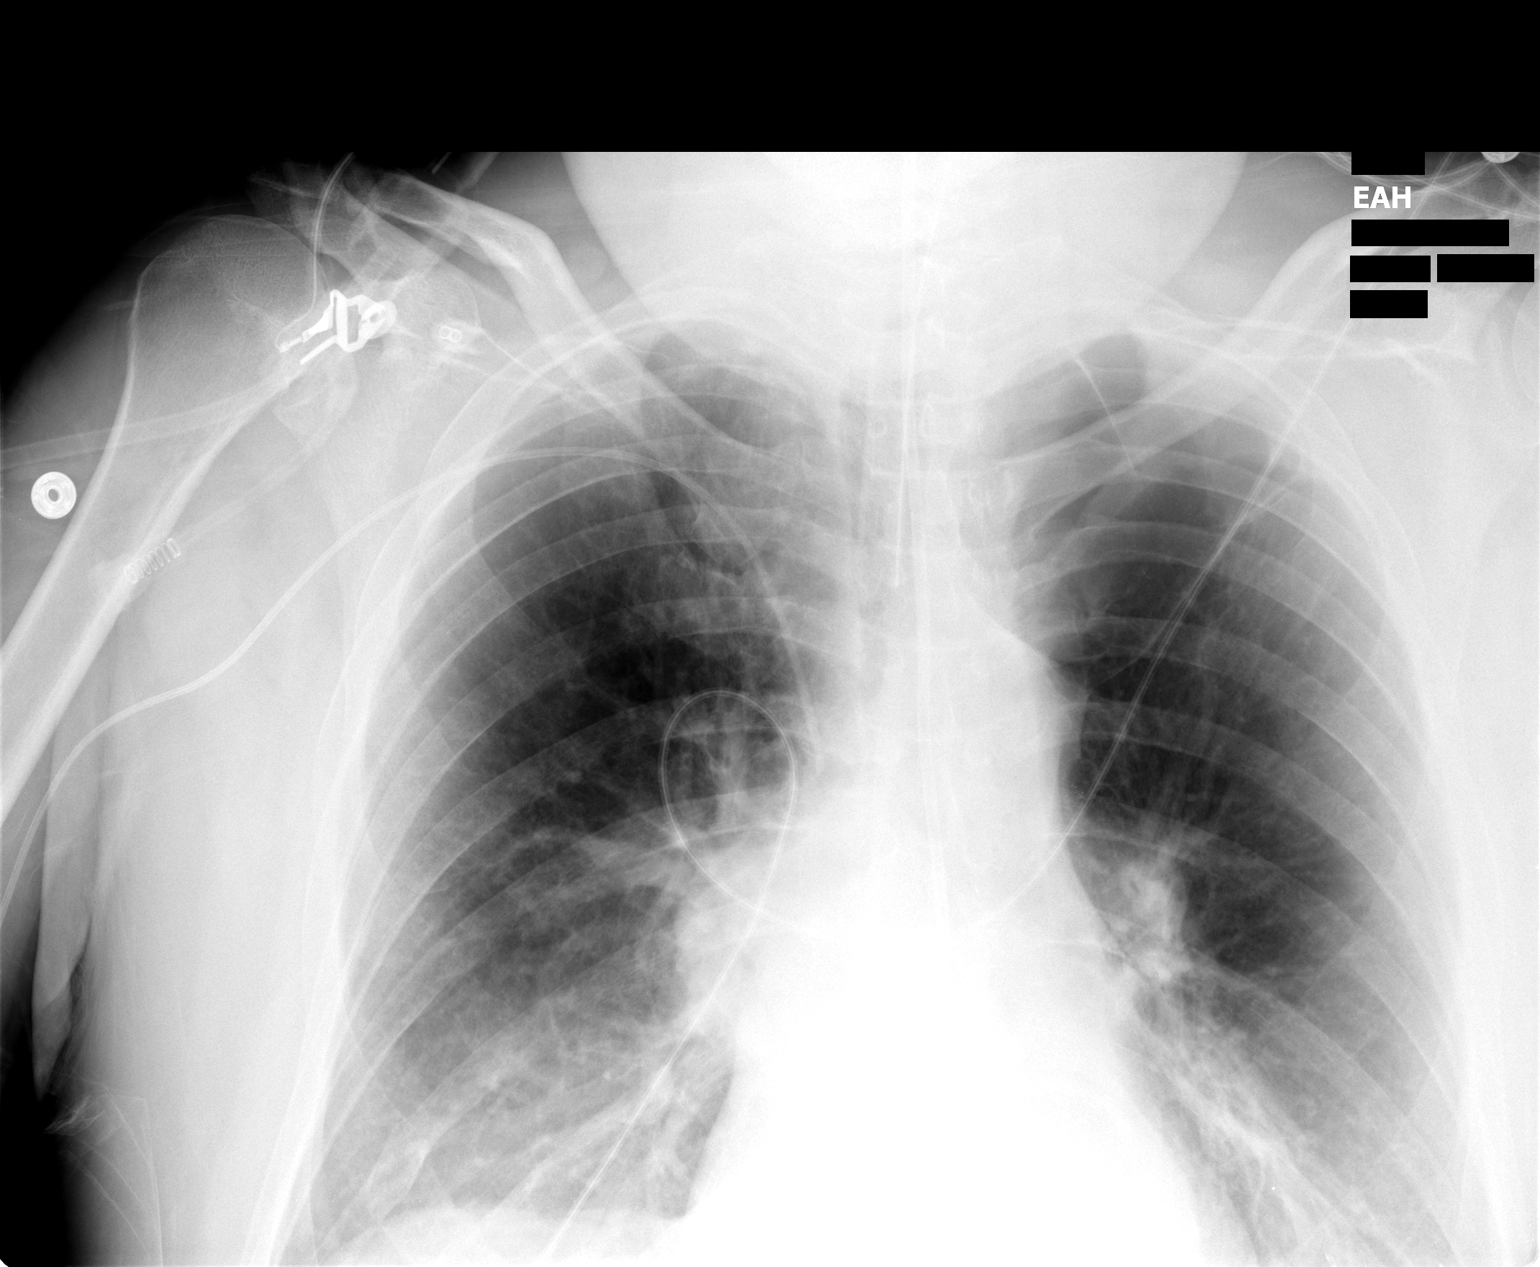

[1 of 1 positions shown; findings below may reference images not displayed]

FINDINGS: Support tubes and lines are unchanged.  Bibasilar
atelectasis persists, also without marked change.  Heart size is
normal.
IMPRESSION: No interval change in bibasilar atelectasis.

## 2008-08-16 IMAGING — CR DG CHEST 1V PORT
1 series · 1 of 1 positions shown · non-contrast
Comparison: 01/12/2008

CLINICAL DATA: AAA.  Patient coughing.

PORTABLE CHEST - 1 VIEW

[view not recorded]
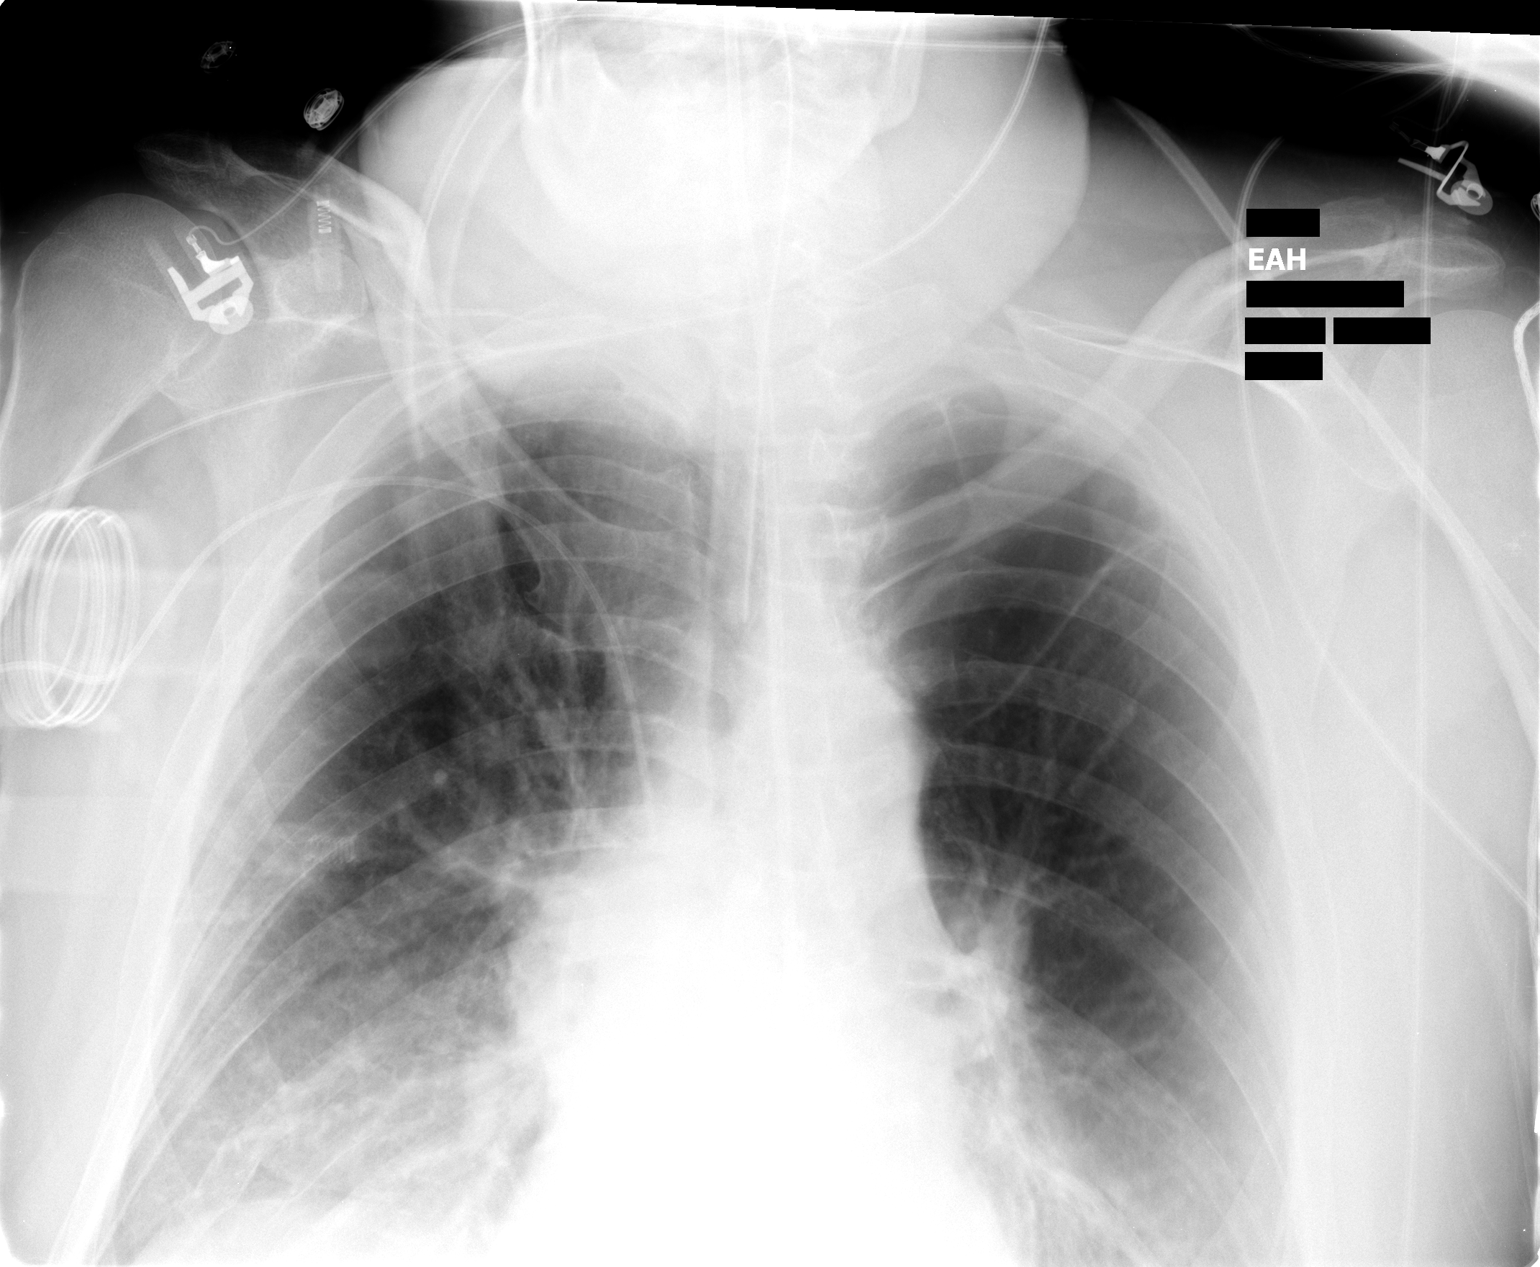

[1 of 1 positions shown; findings below may reference images not displayed]

FINDINGS: Portable exam is performed at [DATE] a.m., showing
endotracheal tube in place with tip approximate 7 - 8 cm above
carina.  Right PICC line tip overlies the superior vena cava.  The
lung bases are excluded.  There is significant perihilar
atelectasis.  Scarring is seen in the left upper lobe.
IMPRESSION: Bases are excluded.  Perihilar atelectasis appears stable.

## 2008-08-17 IMAGING — CR DG CHEST 1V PORT
1 series · 1 of 1 positions shown · non-contrast
Comparison: 01/13/2008

CLINICAL DATA: AAA, postop

PORTABLE CHEST - 1 VIEW

[view not recorded]
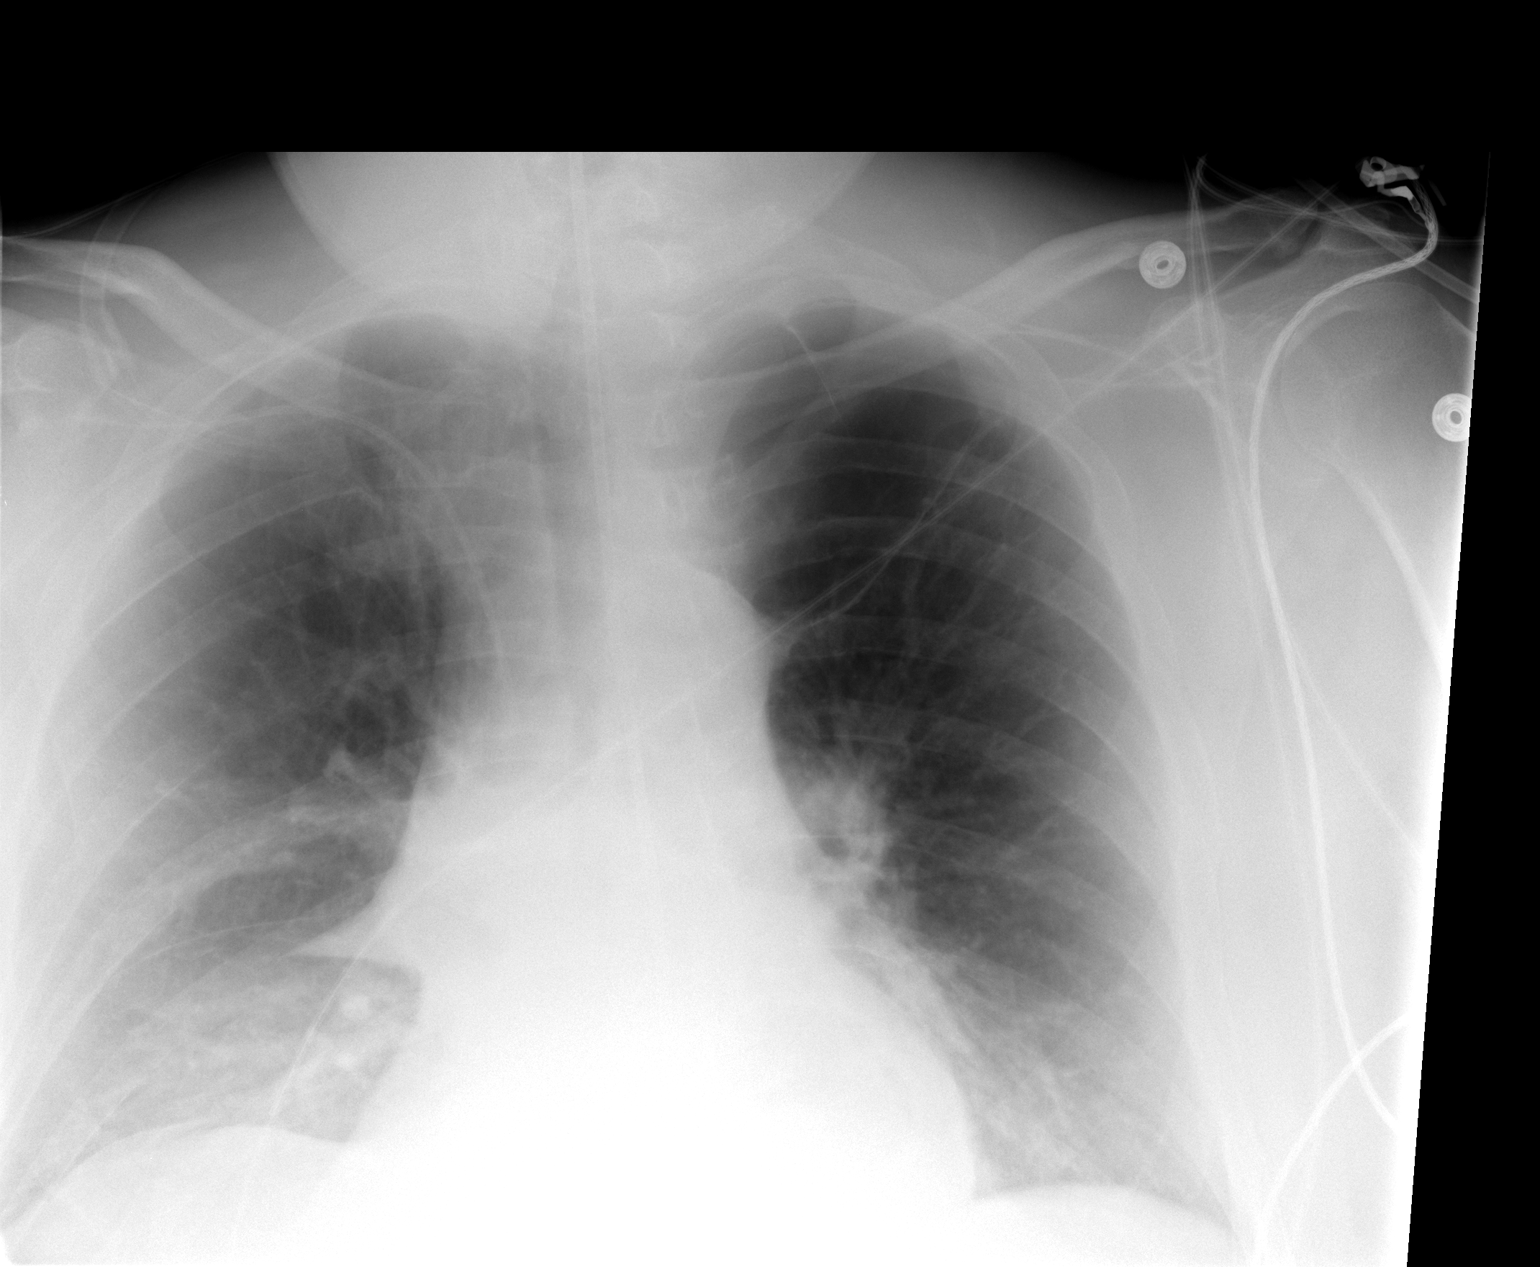

[1 of 1 positions shown; findings below may reference images not displayed]

FINDINGS: Interval extubation.  Slight improvement in bibasilar
atelectasis.  Heart is upper limits normal in size.  Right central
line unchanged.
IMPRESSION: Interval extubation and slight improvement in bibasilar aeration.

## 2008-08-18 IMAGING — CR DG CHEST 1V PORT
1 series · 1 of 1 positions shown · non-contrast
Comparison: 01/15/2008.

CLINICAL DATA: Abdominal aortic aneurysm.  Postop evaluation.

PORTABLE CHEST - 1 VIEW

[view not recorded]
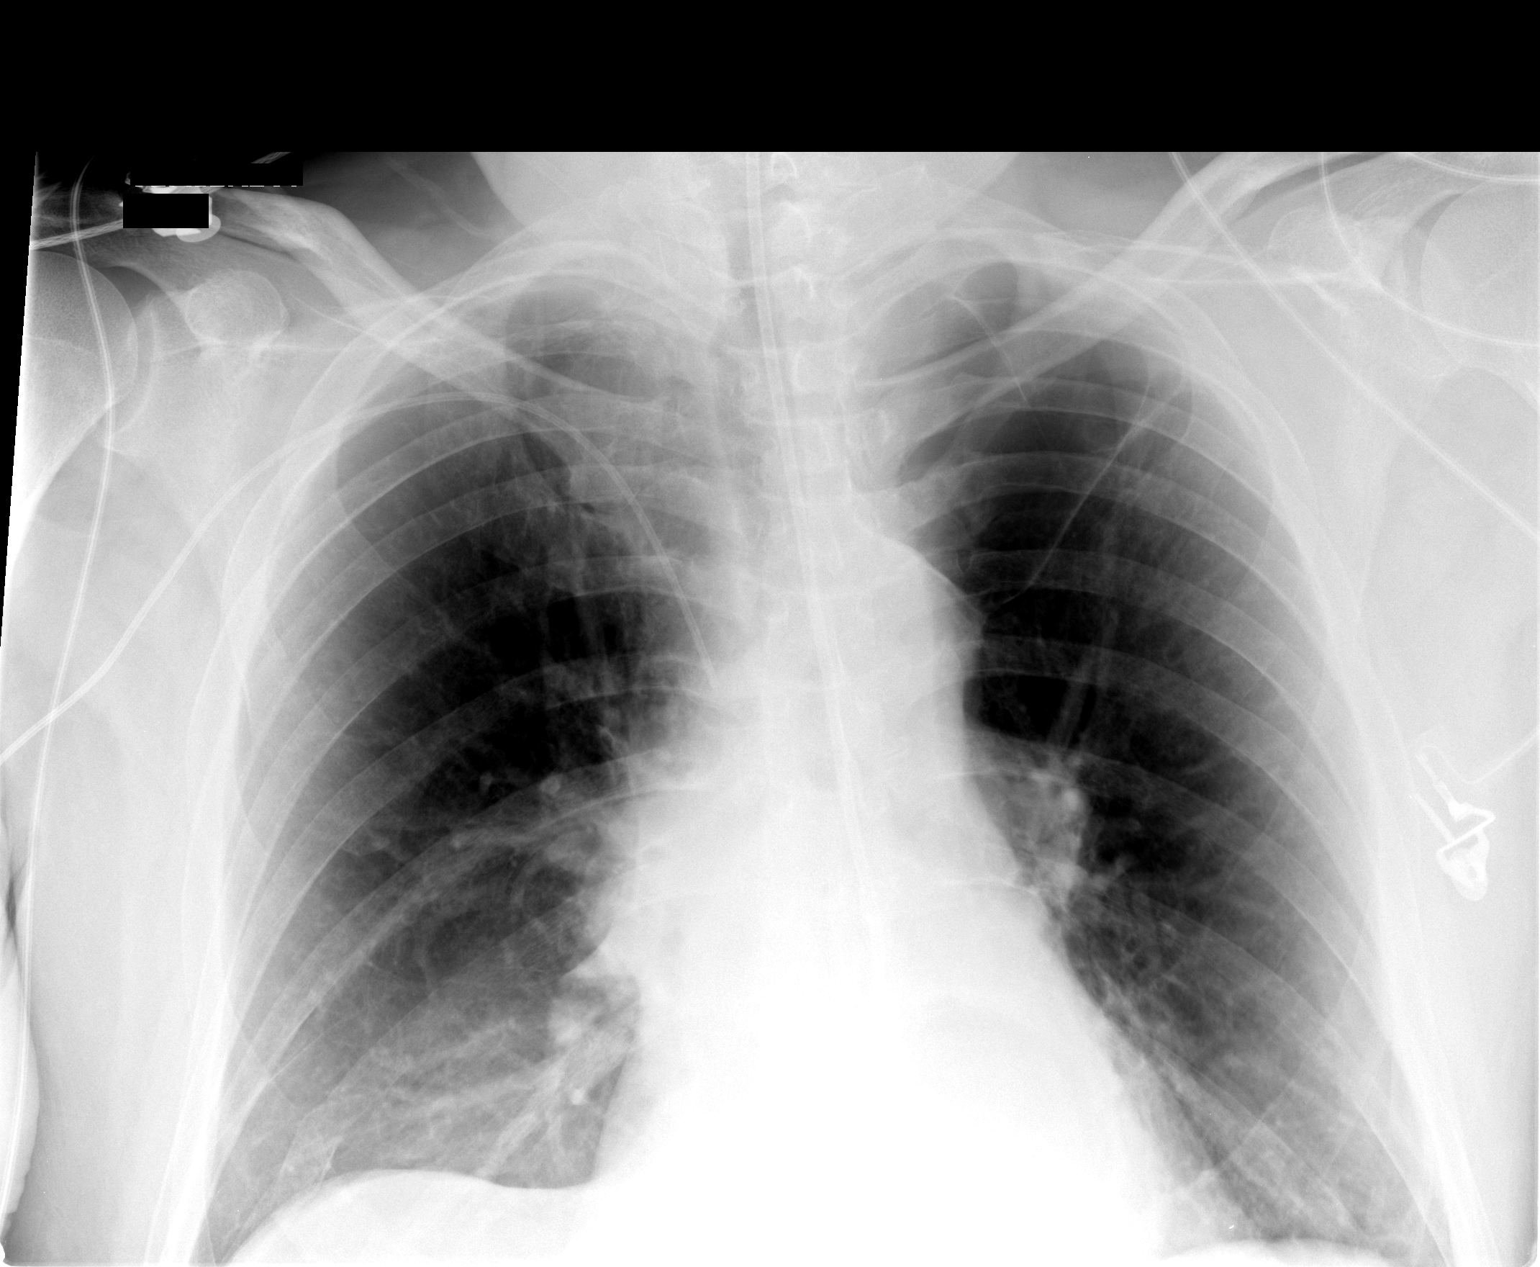

[1 of 1 positions shown; findings below may reference images not displayed]

FINDINGS: [DATE] hours.  Lungs remain hyperexpanded. The
cardiopericardial silhouette is enlarged. Retrocardiac atelectasis
noted.  Feeding catheter passes into the stomach although the
distal tip is not visualized.  Right PICC line remains in place
with tip positioned at the proximal to mid SVC level.  Bullous
changes noted in the left apex.  There is some atelectasis at the
bases with incomplete visualization of the costophrenic sulci
bilaterally.
IMPRESSION: No substantial interval change exam.

## 2008-08-18 IMAGING — CR DG CHEST 1V PORT
1 series · 1 of 1 positions shown · non-contrast
Comparison: 01/14/2008

CLINICAL DATA: Respiratory distress.  Status post AAA repair

PORTABLE CHEST - 1 VIEW

[AP]
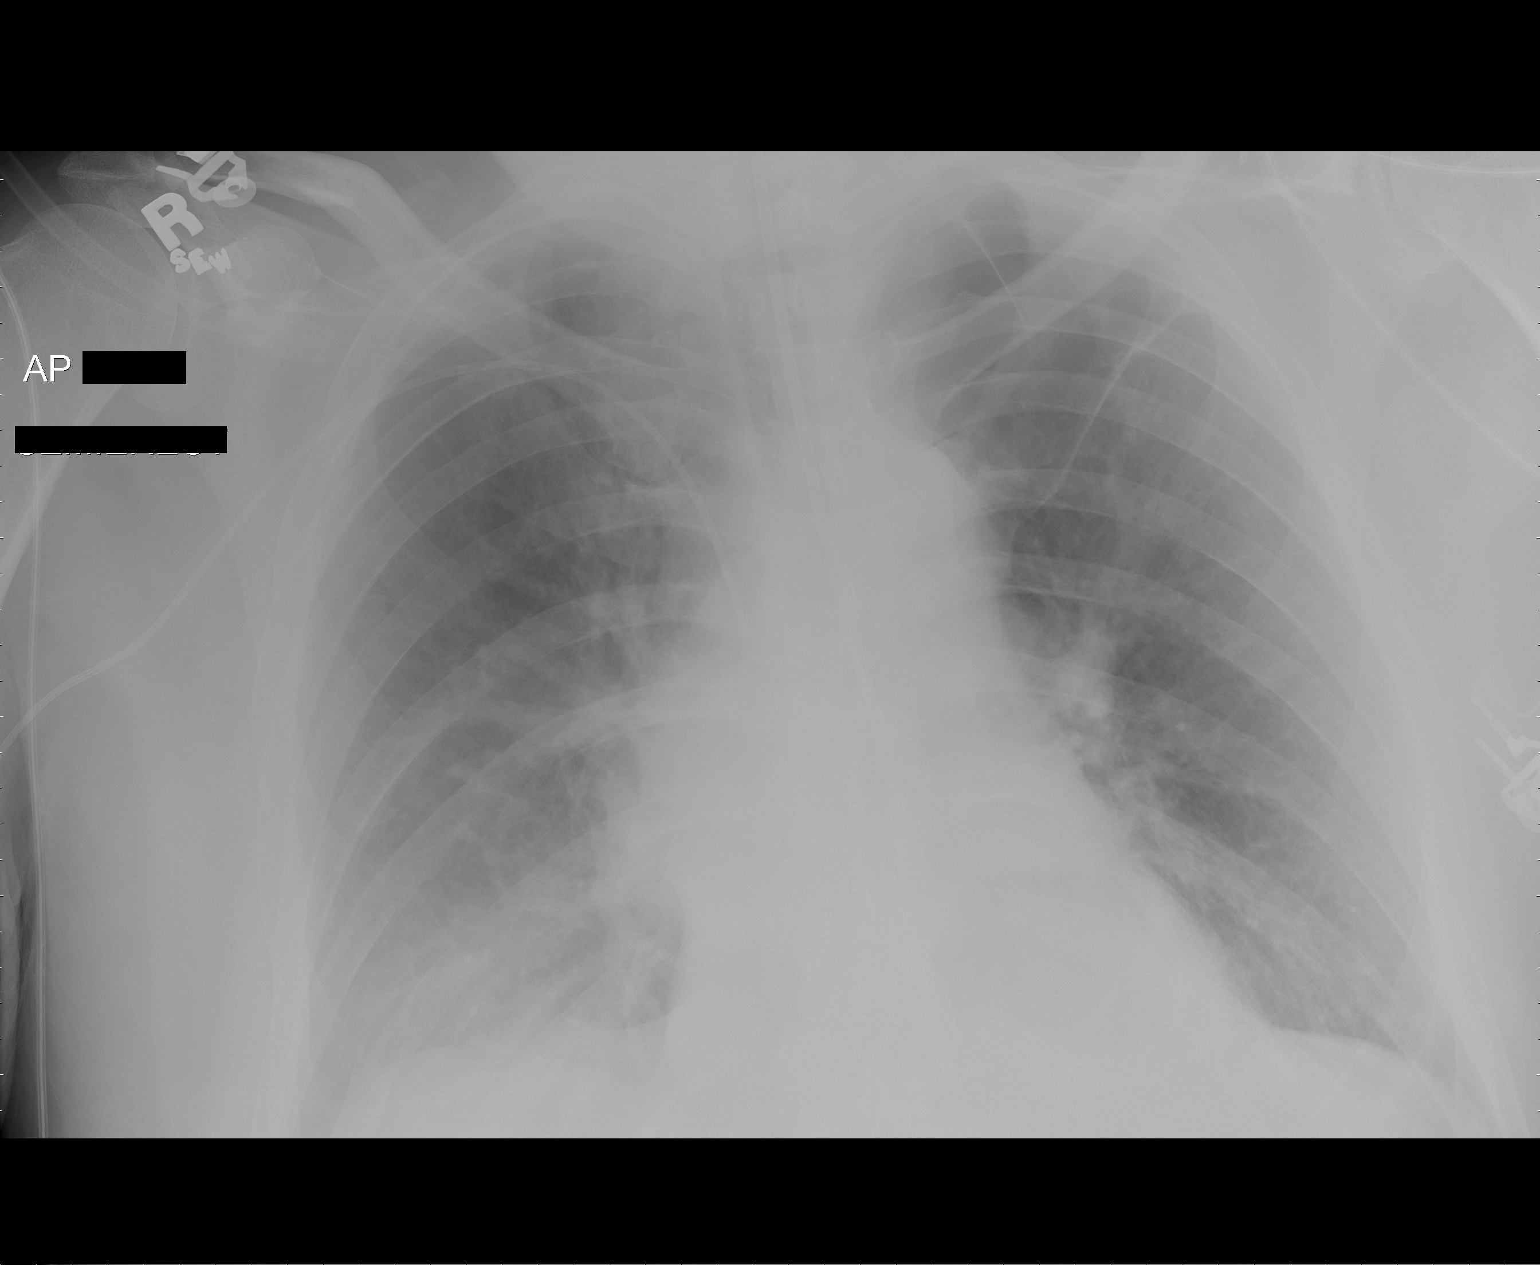

[1 of 1 positions shown; findings below may reference images not displayed]

FINDINGS: Right-sided PICC line tip:  SVC.  Feeding tube extends
down into the stomach.

A large hiatal hernia is present in the retrocardiac position.
Left apical bullae noted.

Mild right basilar airspace opacity is present and there continues
to be some soft tissue prominence along the right to upper heart
border/right hilum.  Indistinct pulmonary vasculature is compatible
with pulmonary venous hypertension.
IMPRESSION: 1.  Similar appearance of the chest, with mild right basilar
atelectasis, pulmonary venous hypertension, and possible small
right pleural effusion.  There is also some prominence in the right
hilar region which appears stable.
2.  Hiatal hernia.
3.  Left apical bullae, stable.

## 2008-08-19 IMAGING — CR DG CHEST 1V PORT
1 series · 1 of 1 positions shown · non-contrast
Comparison: 01/15/2008.

CLINICAL DATA: Status post repair of abdominal aortic aneurysm.

PORTABLE CHEST - 1 VIEW

[AP]
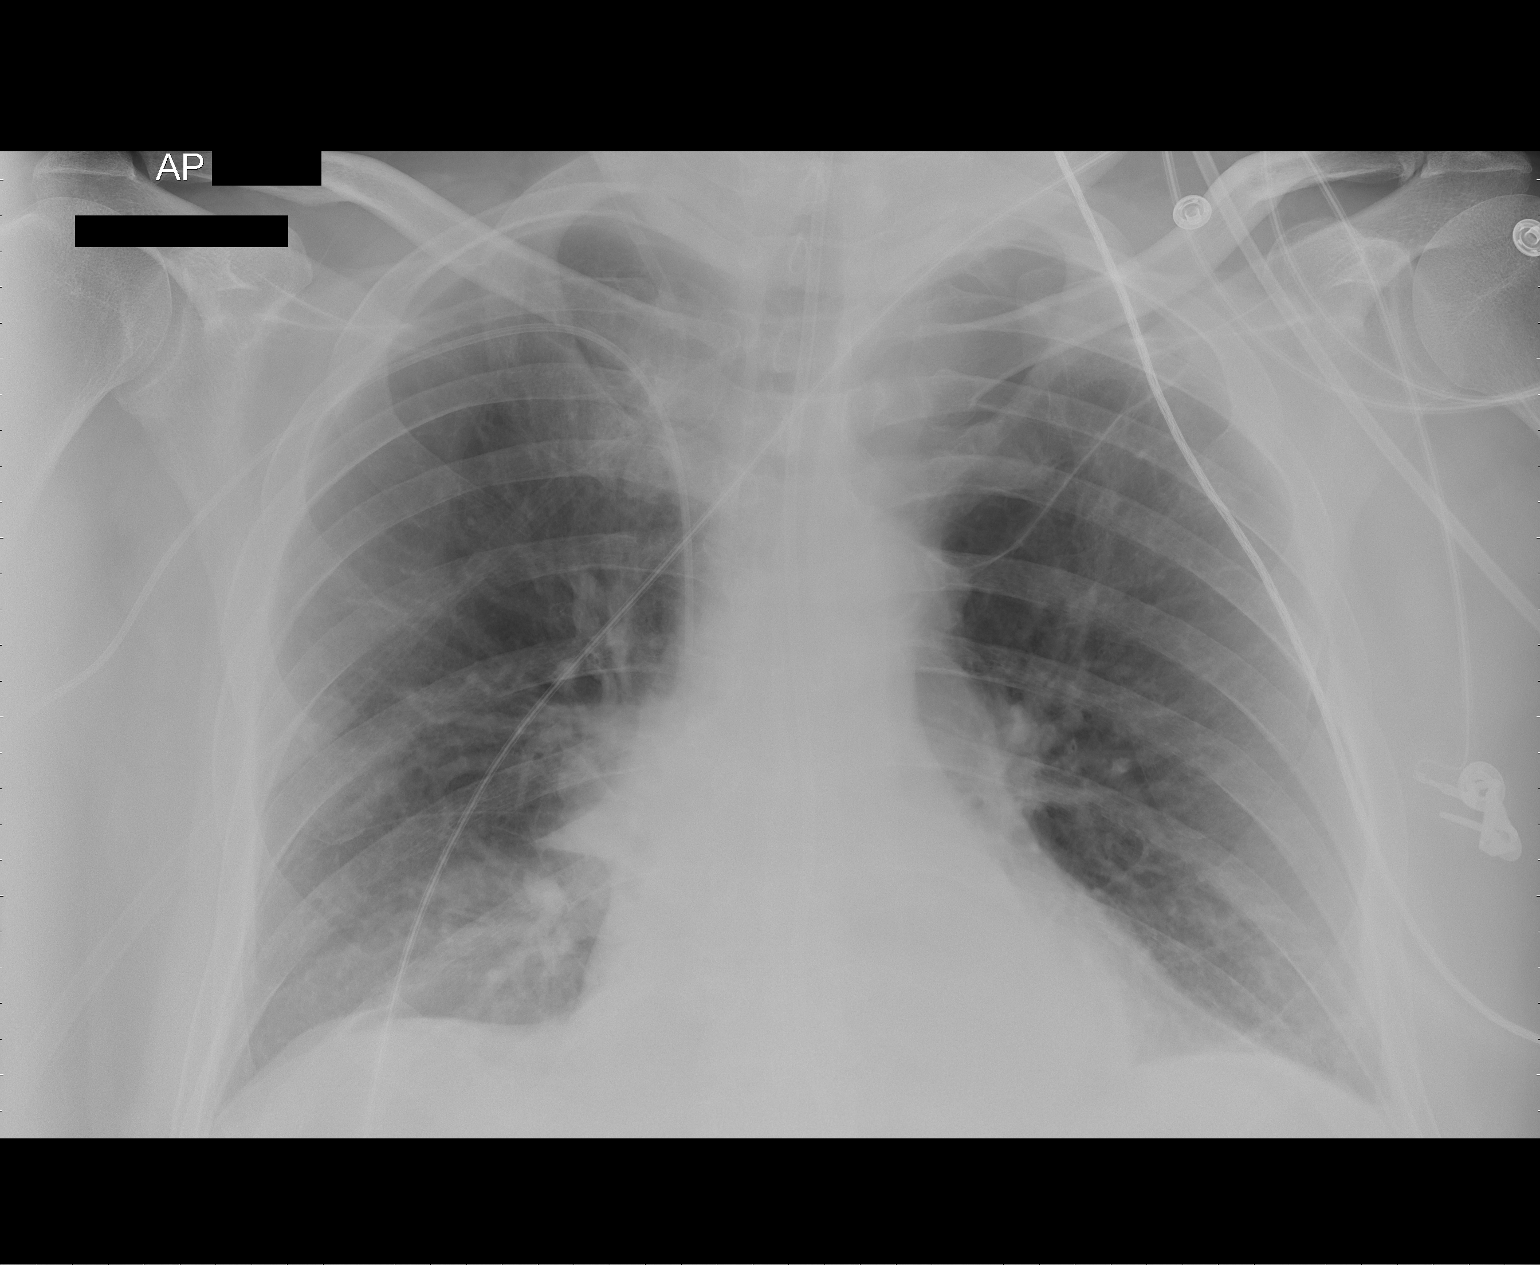

[1 of 1 positions shown; findings below may reference images not displayed]

FINDINGS: A right sided PICC line and feeding tube are stable in
position.  Minimal bibasilar atelectasis is unchanged.  Mild
pulmonary vascular congestion is again noted.
IMPRESSION: Stable mild pulmonary vascular congestion and minimal bibasilar
atelectasis.

## 2008-08-21 IMAGING — CR DG CHEST 1V PORT
1 series · 1 of 1 positions shown · non-contrast
Comparison: 01/16/2008

CLINICAL DATA: Abdominal aortic aneurysm.  Respiratory distress.

PORTABLE CHEST - 1 VIEW

[view not recorded]
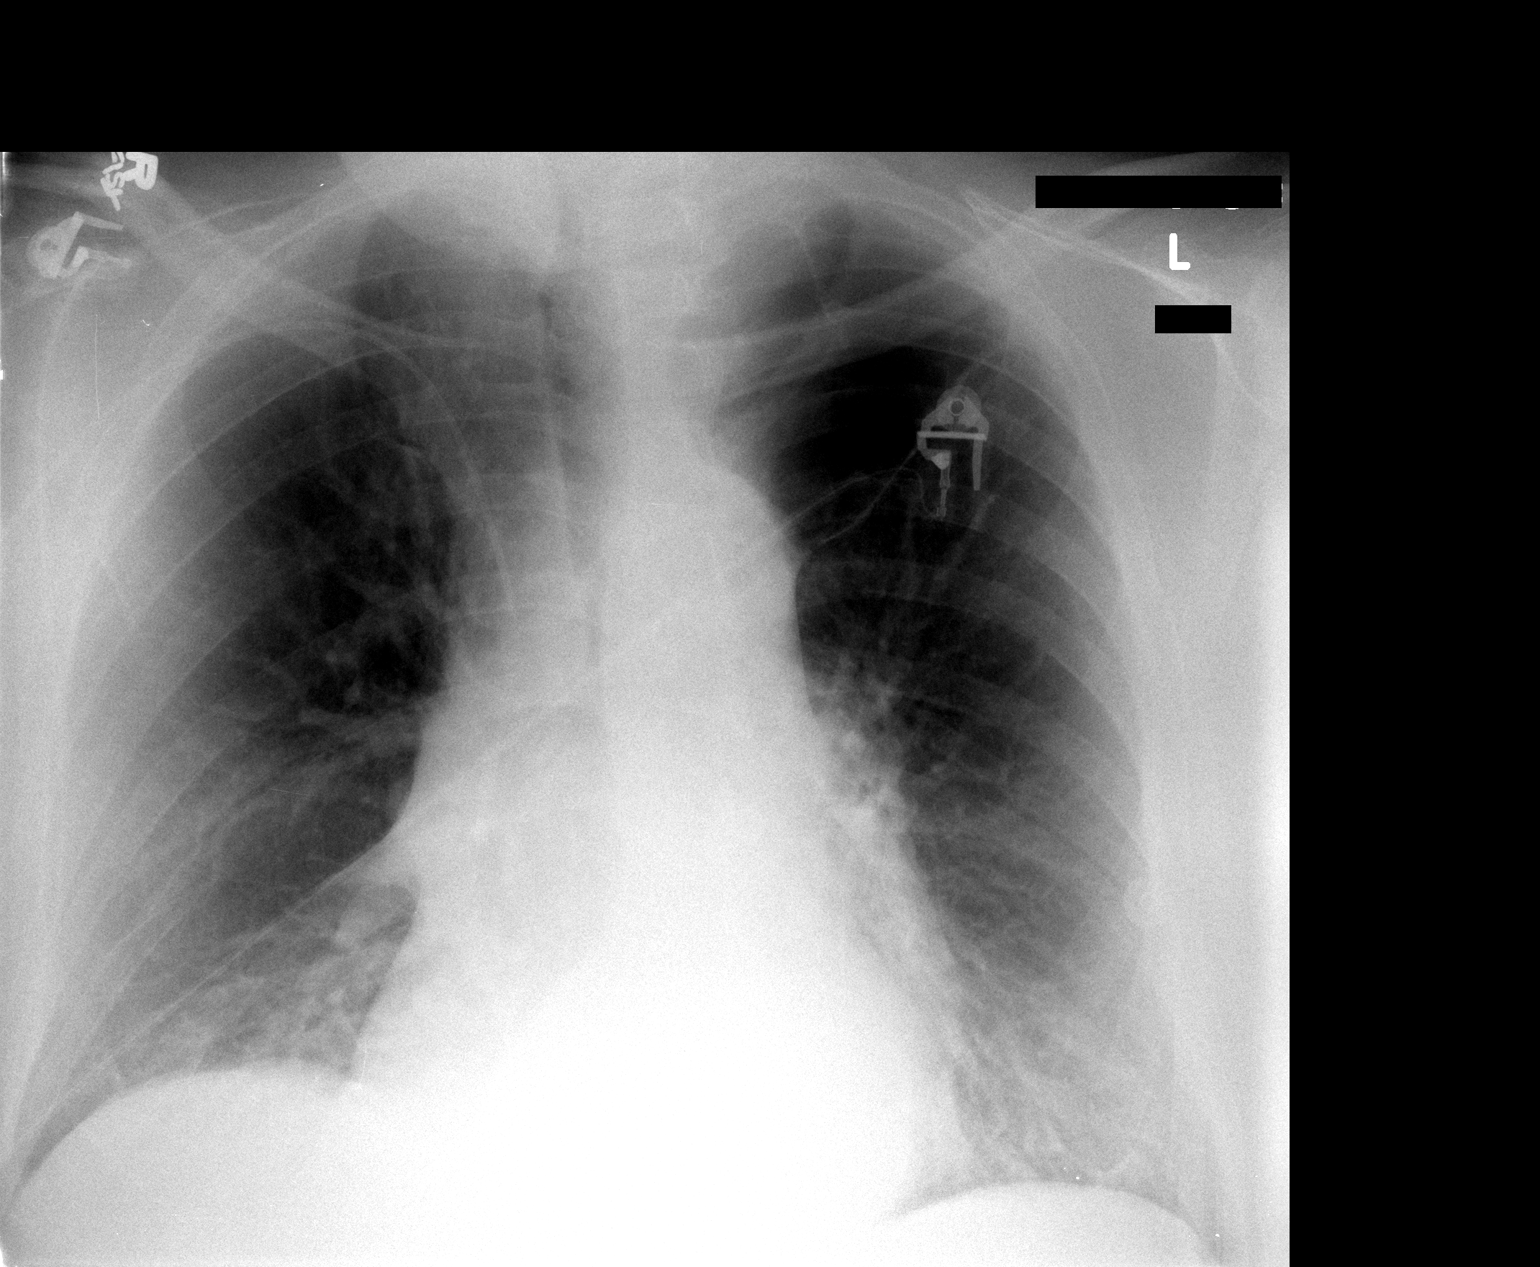

[1 of 1 positions shown; findings below may reference images not displayed]

FINDINGS: Right-sided PICC line tip projects over the superior vena
cava.  There is some scarring or fluid along the medial portion of
the minor fissure.  Retrocardiac density is compatible with hiatal
hernia.

A prominent left apical bullae is present.  This appears stable.
No new airspace opacity is identified.
IMPRESSION: 1.  Stable radiographic appearance of the chest, with mild right
mid lung scarring and left apical bullae, but no new airspace
opacity.
2.  Hiatal hernia.

## 2008-09-07 IMAGING — CT CT ANGIO ABDOMEN
2 of 5 series · 16 of 46 positions shown, 18 images · IV contrast ([ID] OMNI [id number])
Comparison: 01/04/2008.

CTA ABDOMEN

CLINICAL DATA: Follow-up stent repair of AAA.

CT ANGIOGRAPHY OF ABDOMEN AND PELVIS WITHOUT AND/OR WITH CONTRAST -
AAA PROTOCOL
TECHNIQUE: Multidetector CT imaging of the abdomen and pelvis was
performed before and during bolus injection of intravenous
contrast.  Multiplanar CT angiographic image reconstructions were
also generated to evaluate the vascular structures.
Contrast: 100 ml Omnipaque 350.

[Series 5: angio · axial · 0.74mm/px · z∈[-466,-69]mm · 13 of 244 slices shown, 15 images]
[im 11/244  soft-tissue]
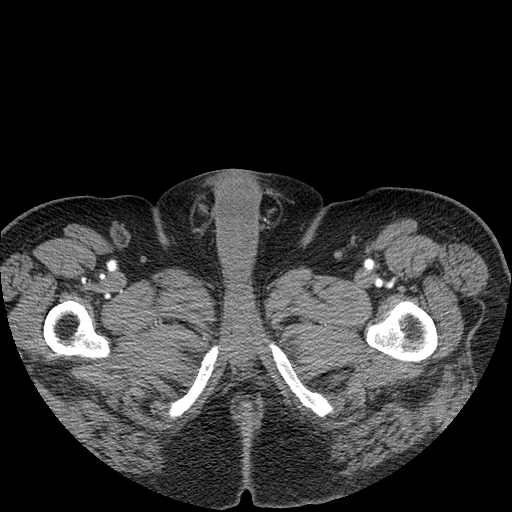
[im 11/244  bone]
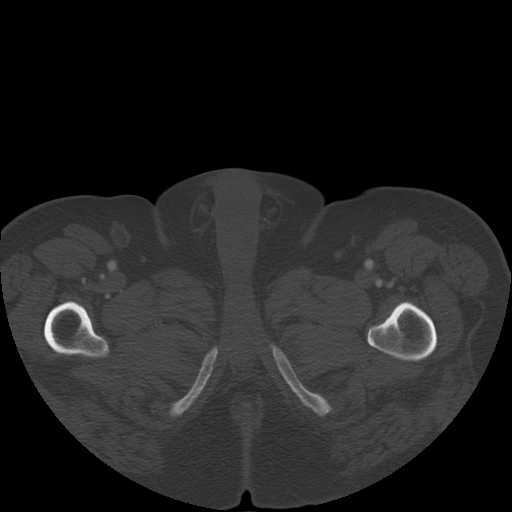
[im 32/244  soft-tissue]
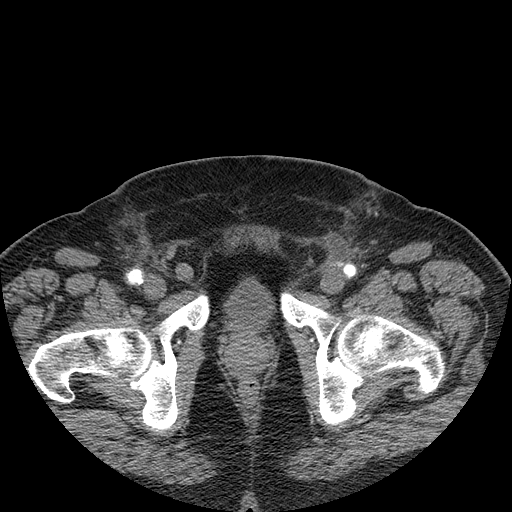
[im 53/244  soft-tissue]
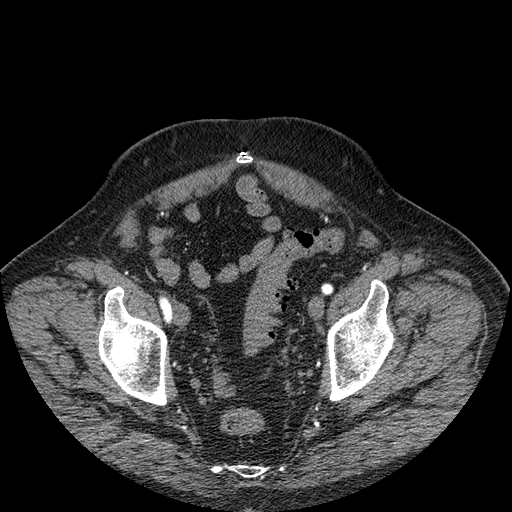
[im 64/244  soft-tissue]
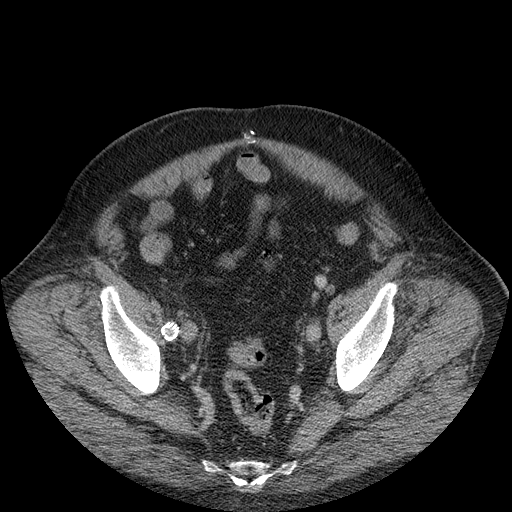
[im 85/244  soft-tissue]
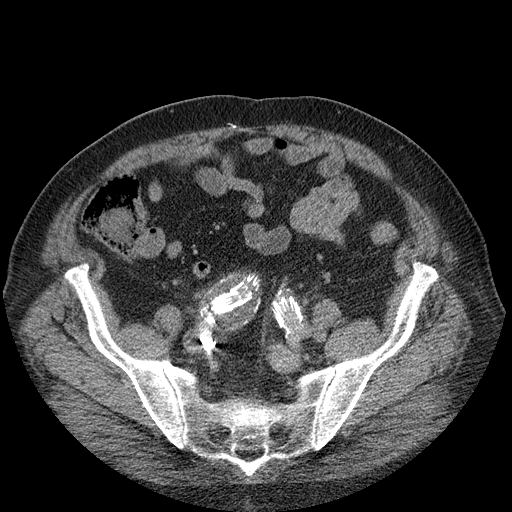
[im 106/244  soft-tissue]
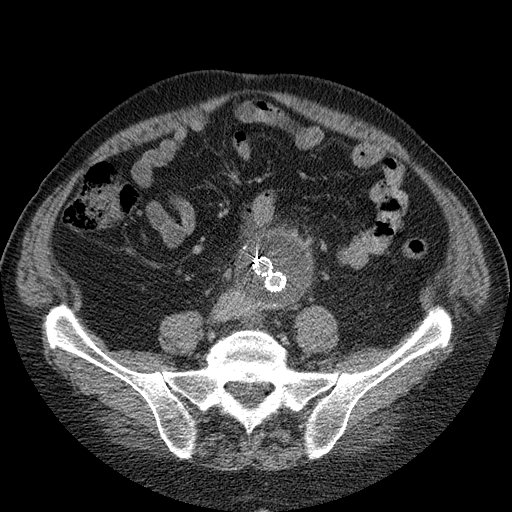
[im 127/244  soft-tissue]
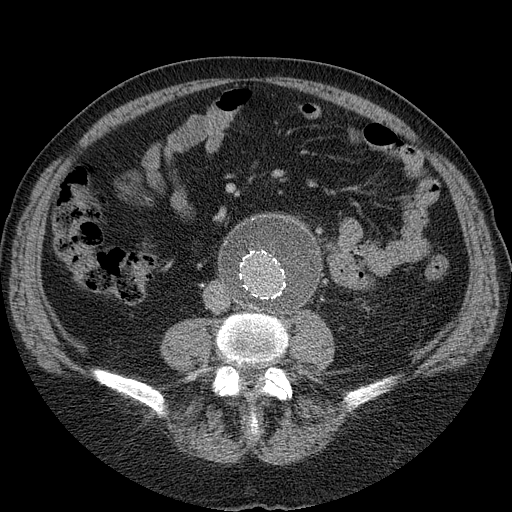
[im 138/244  soft-tissue]
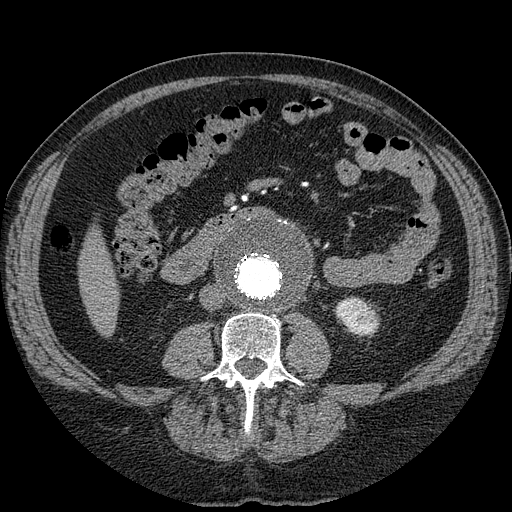
[im 159/244  soft-tissue]
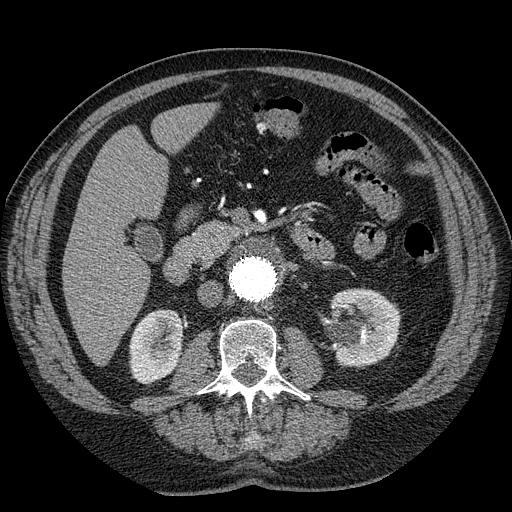
[im 159/244  bone]
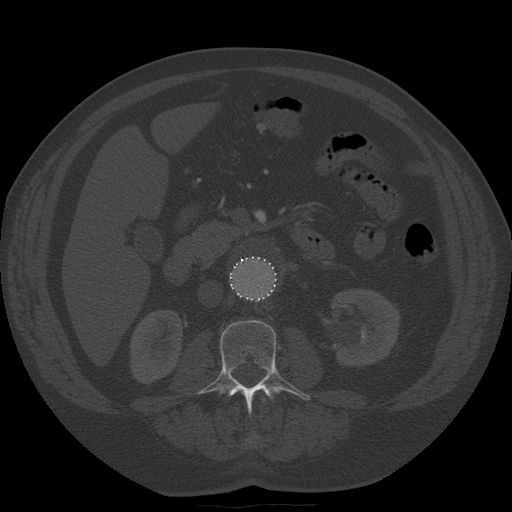
[im 180/244  soft-tissue]
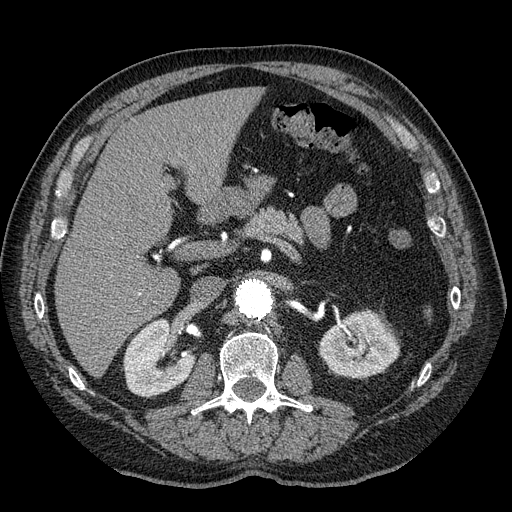
[im 191/244  soft-tissue]
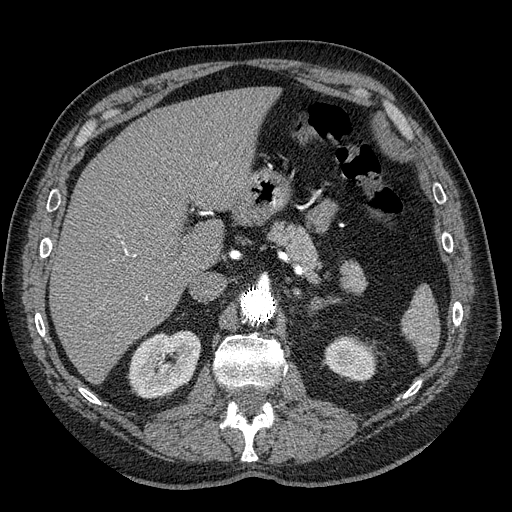
[im 212/244  soft-tissue]
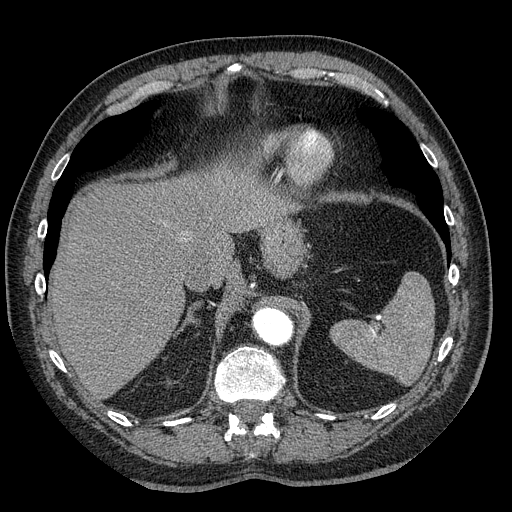
[im 233/244  soft-tissue]
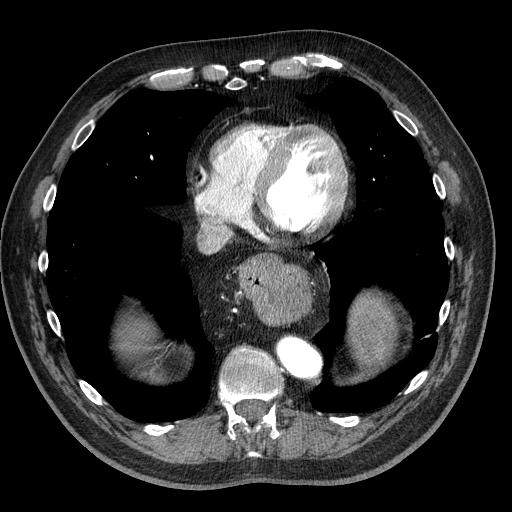

[Series 602: sagittal angio · sagittal · 0.88mm/px · 3 of 153 slices shown]
[im 51/153  soft-tissue]
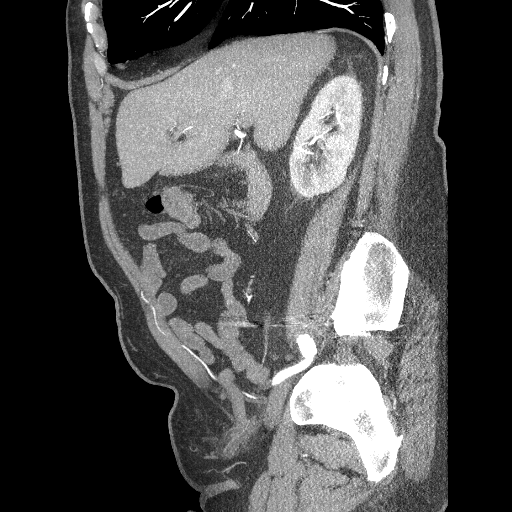
[im 68/153  soft-tissue]
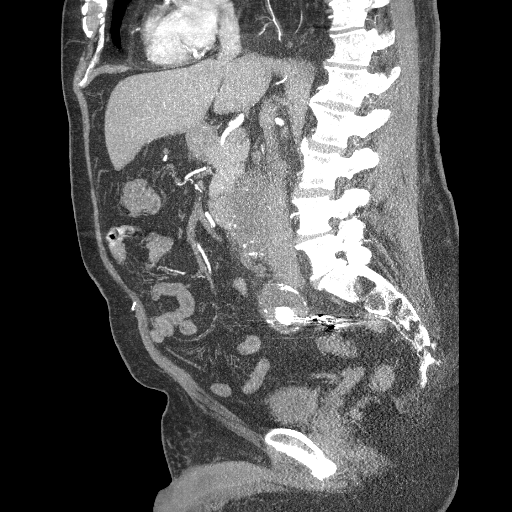
[im 85/153  soft-tissue]
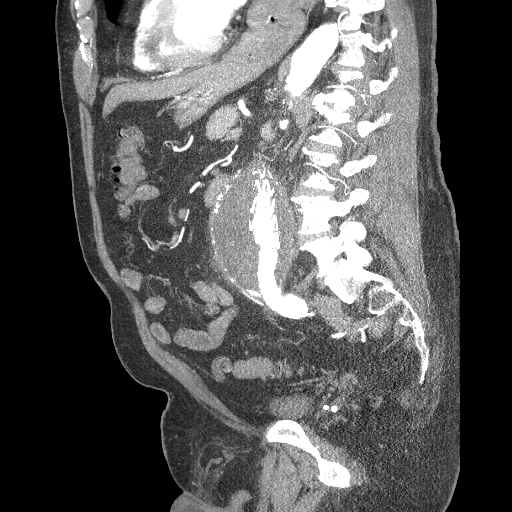

[16 of 46 positions shown; findings below may reference images not displayed]

FINDINGS: Lung bases show a large hiatal hernia and scarring in the
left lower lobe.  Heart size normal.  No pericardial or pleural
effusion.

Liver, gallbladder and right adrenal gland unremarkable.  Left
adrenal gland is nodular in appearance, stable.  Small low
attenuation lesion in the right kidney is too small to characterize
but likely a cyst.  Wedge-shaped scarring in the lower pole of the
left kidney.  Spleen, pancreas, stomach and small bowel are
otherwise unremarkable.  No pathologically enlarged lymph nodes.
No free fluid.

The patient is status post stent graft repair of an infrarenal
aortic aneurysm.  The native aneurysm sac measures 7.6 x 8 7.7 cm,
stable.  No evidence of leak.  The iliac limbs opacify
symmetrically.  Right common iliac artery aneurysm sac measures
cm, stable.  Embolization coils are seen in the right internal
iliac artery distribution.  Mild periaortic stranding is likely
postoperative in etiology.
IMPRESSION: 1.  Interval stent graft repair of infrarenal aortic aneurysm
without evidence of leak.  Infrarenal aortic and right common iliac
artery aneurysm sacs are stable in size.

CTA PELVIS
FINDINGS: A small postoperative fluid collection the left groin
measures 1.6 x 2.2 cm.  Right inguinal hernia contains unobstructed
small bowel.  Colon unremarkable.  No pathologically enlarged lymph
nodes.  No free fluid.  No worrisome lytic or sclerotic lesions.
IMPRESSION: 1.  Small postoperative seroma in the left groin.
2.  Right inguinal hernia contains unobstructed small bowel.

## 2008-12-08 ENCOUNTER — Encounter: Admission: RE | Admit: 2008-12-08 | Discharge: 2008-12-08 | Payer: Self-pay | Admitting: Family Medicine

## 2008-12-15 ENCOUNTER — Ambulatory Visit: Payer: Self-pay | Admitting: Cardiothoracic Surgery

## 2008-12-15 ENCOUNTER — Inpatient Hospital Stay (HOSPITAL_COMMUNITY): Admission: EM | Admit: 2008-12-15 | Discharge: 2008-12-28 | Payer: Self-pay | Admitting: Emergency Medicine

## 2008-12-16 ENCOUNTER — Encounter (INDEPENDENT_AMBULATORY_CARE_PROVIDER_SITE_OTHER): Payer: Self-pay | Admitting: Cardiovascular Disease

## 2008-12-17 ENCOUNTER — Encounter: Payer: Self-pay | Admitting: Cardiothoracic Surgery

## 2008-12-18 ENCOUNTER — Encounter: Payer: Self-pay | Admitting: Pulmonary Disease

## 2009-01-19 ENCOUNTER — Encounter: Admission: RE | Admit: 2009-01-19 | Discharge: 2009-01-19 | Payer: Self-pay | Admitting: Cardiothoracic Surgery

## 2009-01-19 ENCOUNTER — Ambulatory Visit: Payer: Self-pay | Admitting: Cardiothoracic Surgery

## 2009-02-09 ENCOUNTER — Ambulatory Visit: Payer: Self-pay | Admitting: Cardiothoracic Surgery

## 2009-06-29 ENCOUNTER — Encounter: Admission: RE | Admit: 2009-06-29 | Discharge: 2009-06-29 | Payer: Self-pay | Admitting: Cardiovascular Disease

## 2009-07-12 IMAGING — CT CT ANGIO CHEST
3 of 9 series · 16 of 36 positions shown · IV contrast (CONTRAST)
Comparison: Chest x-ray 01/18/2008

CLINICAL DATA: Shortness of breath.

CT ANGIOGRAPHY CHEST
TECHNIQUE: Multidetector CT imaging of the chest using the
standard protocol during bolus administration of intravenous
contrast. Multiplanar reconstructed images including MIPs were
obtained and reviewed to evaluate the vascular anatomy.
Contrast: 125 ml Omnipaque 350

[Series 7: thins pacs · axial · 0.74mm/px · z∈[+1297,+1620]mm · 11 of 279 slices shown]
[im 24/279  lung]
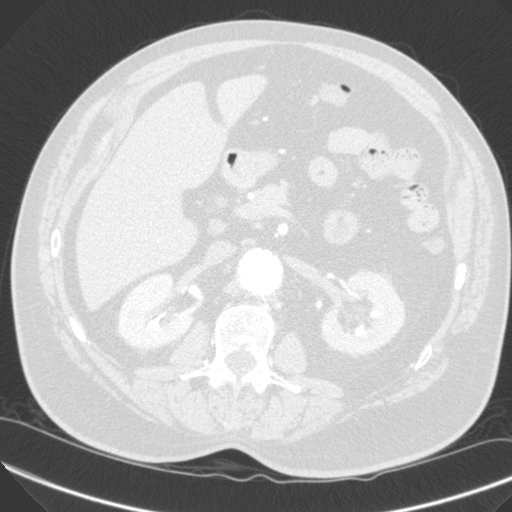
[im 47/279  mediastinal]
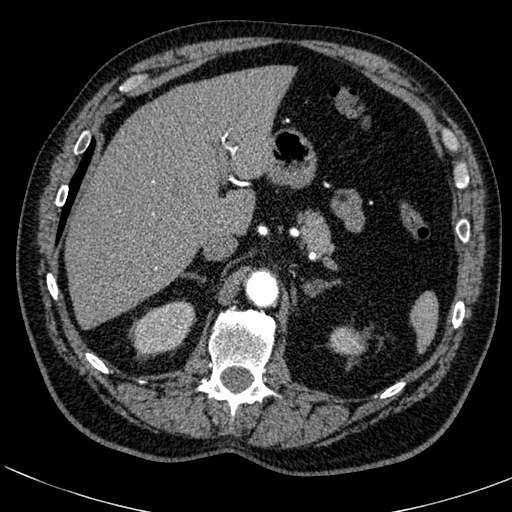
[im 70/279  lung]
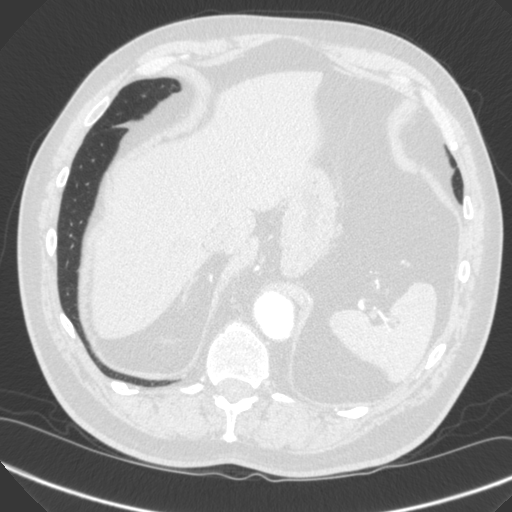
[im 93/279  mediastinal]
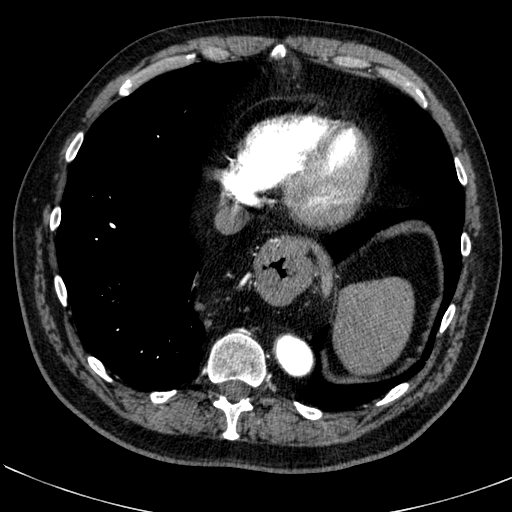
[im 116/279  lung]
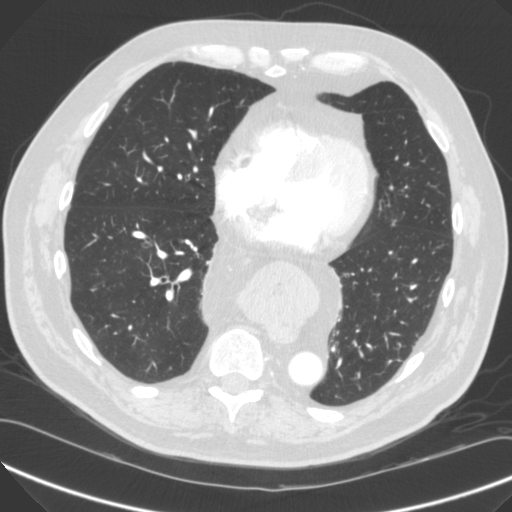
[im 140/279  mediastinal]
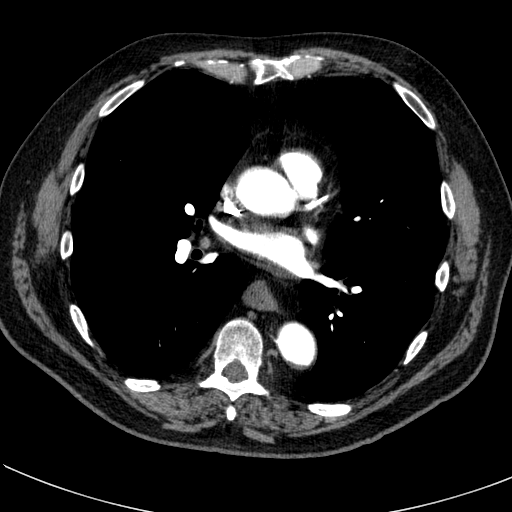
[im 163/279  lung]
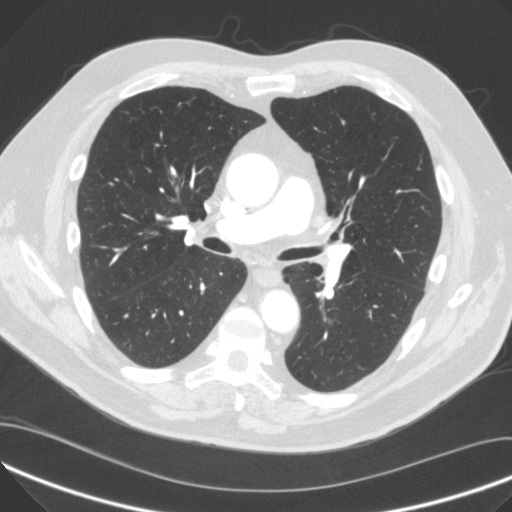
[im 186/279  mediastinal]
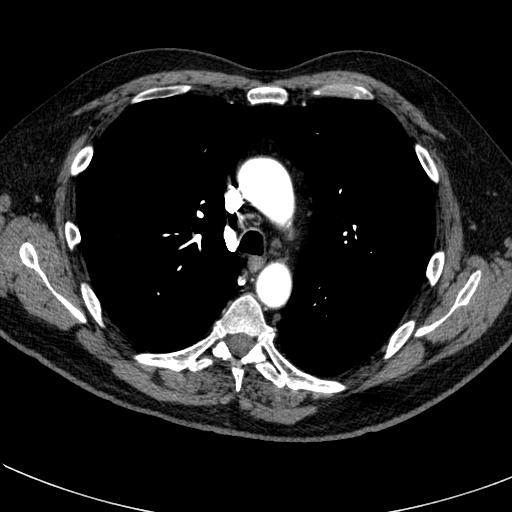
[im 209/279  lung]
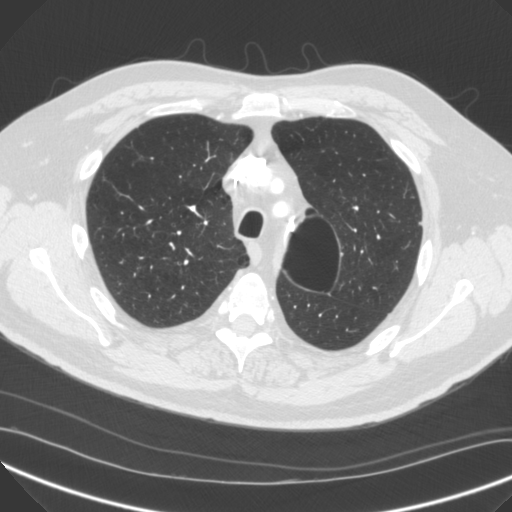
[im 232/279  mediastinal]
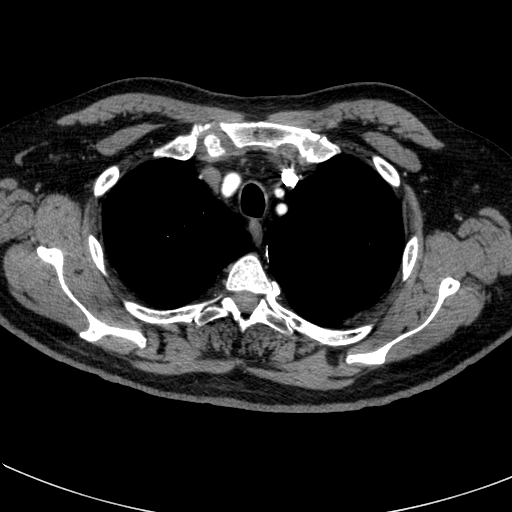
[im 255/279  lung]
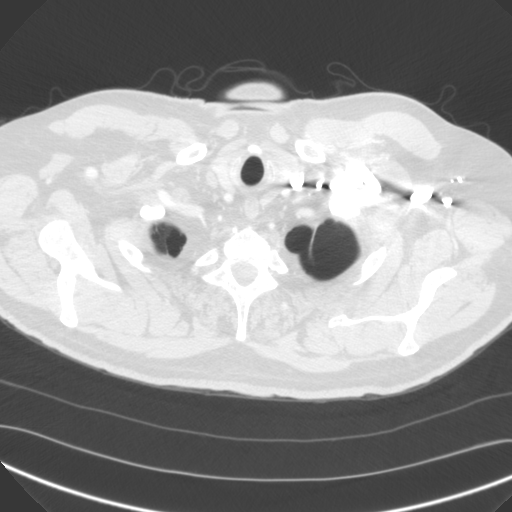

[Series 8: lung 3's · axial · 0.74mm/px · z∈[+1343,+1577]mm · 4 of 130 slices shown]
[im 26/130  mediastinal]
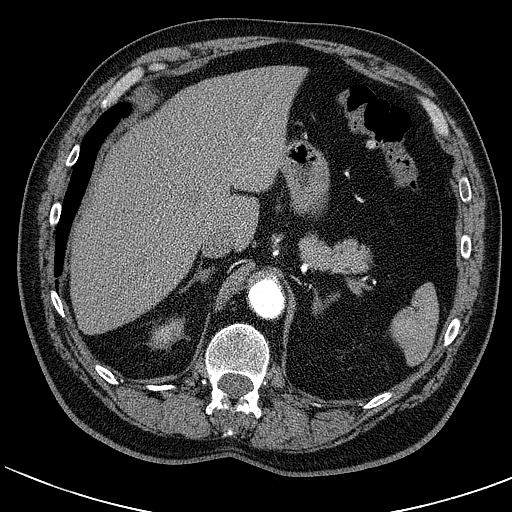
[im 52/130  mediastinal]
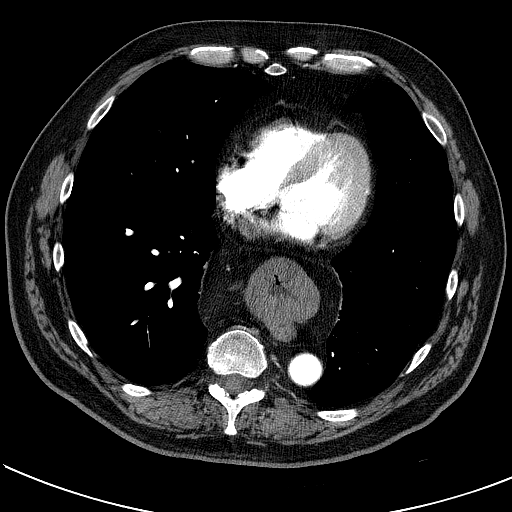
[im 78/130  mediastinal]
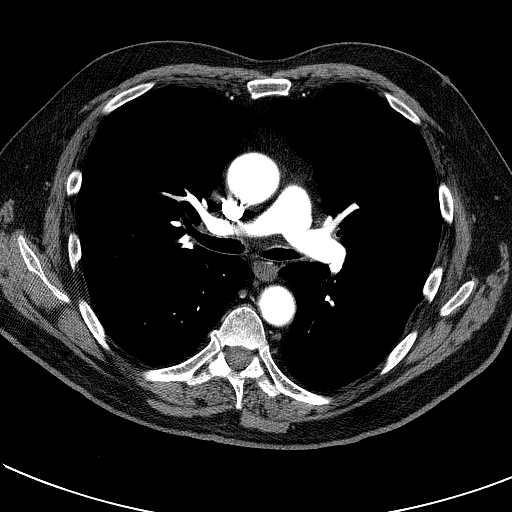
[im 104/130  mediastinal]
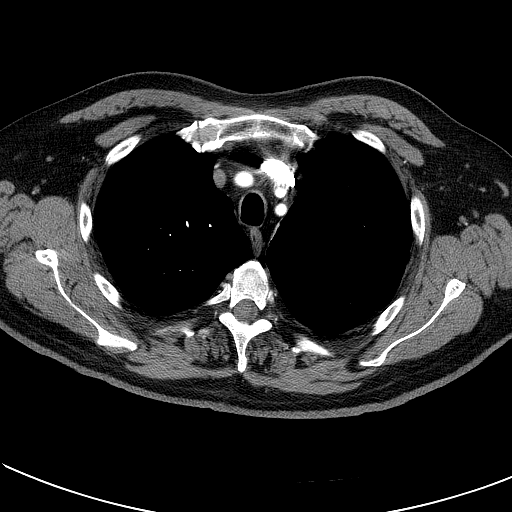

[coronals · coronal · 0.76mm/px · 1 of 138 slices shown]
[im 69/138  mediastinal]
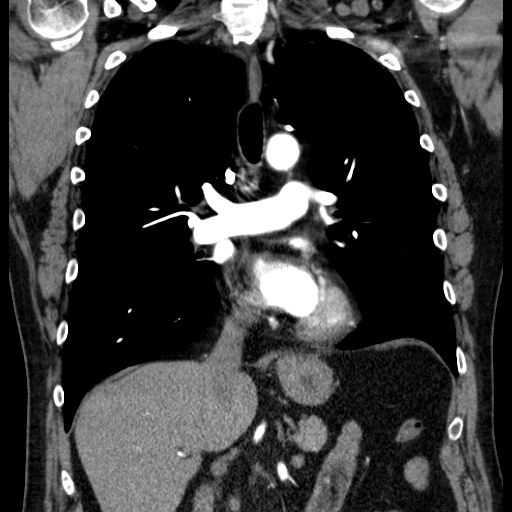

[16 of 36 positions shown; findings below may reference images not displayed]

FINDINGS: No pulmonary embolus.  No pathologically enlarged
mediastinal, hilar or axillary lymph nodes.  Heart size normal.  No
pericardial effusion.  There is a large hiatal hernia.  Lymph nodes
in the fat adjacent to the hiatal hernia measure up to 8 mm short
axis.

Large bullous lesion is seen in the left apex.  Paraseptal
emphysema is seen in the right apex.  Lungs otherwise clear.  No
pleural fluid.  Airway is unremarkable.

Incidental imaging of the upper abdomen shows repair of an
abdominal aortic aneurysm.  No acute findings.  No worrisome lytic
or sclerotic lesions.
IMPRESSION: 1.  No pulmonary embolus.
2.  Large left apical bullous lesion.
3.  Large hiatal hernia.

## 2009-07-19 IMAGING — CR DG CHEST 1V PORT
1 series · 1 of 1 positions shown · non-contrast
Comparison: Portable chest x-ray of 01/18/2008

CLINICAL DATA: Unstable angina for 2 months, smoking history

PORTABLE CHEST - 1 VIEW

[view not recorded]
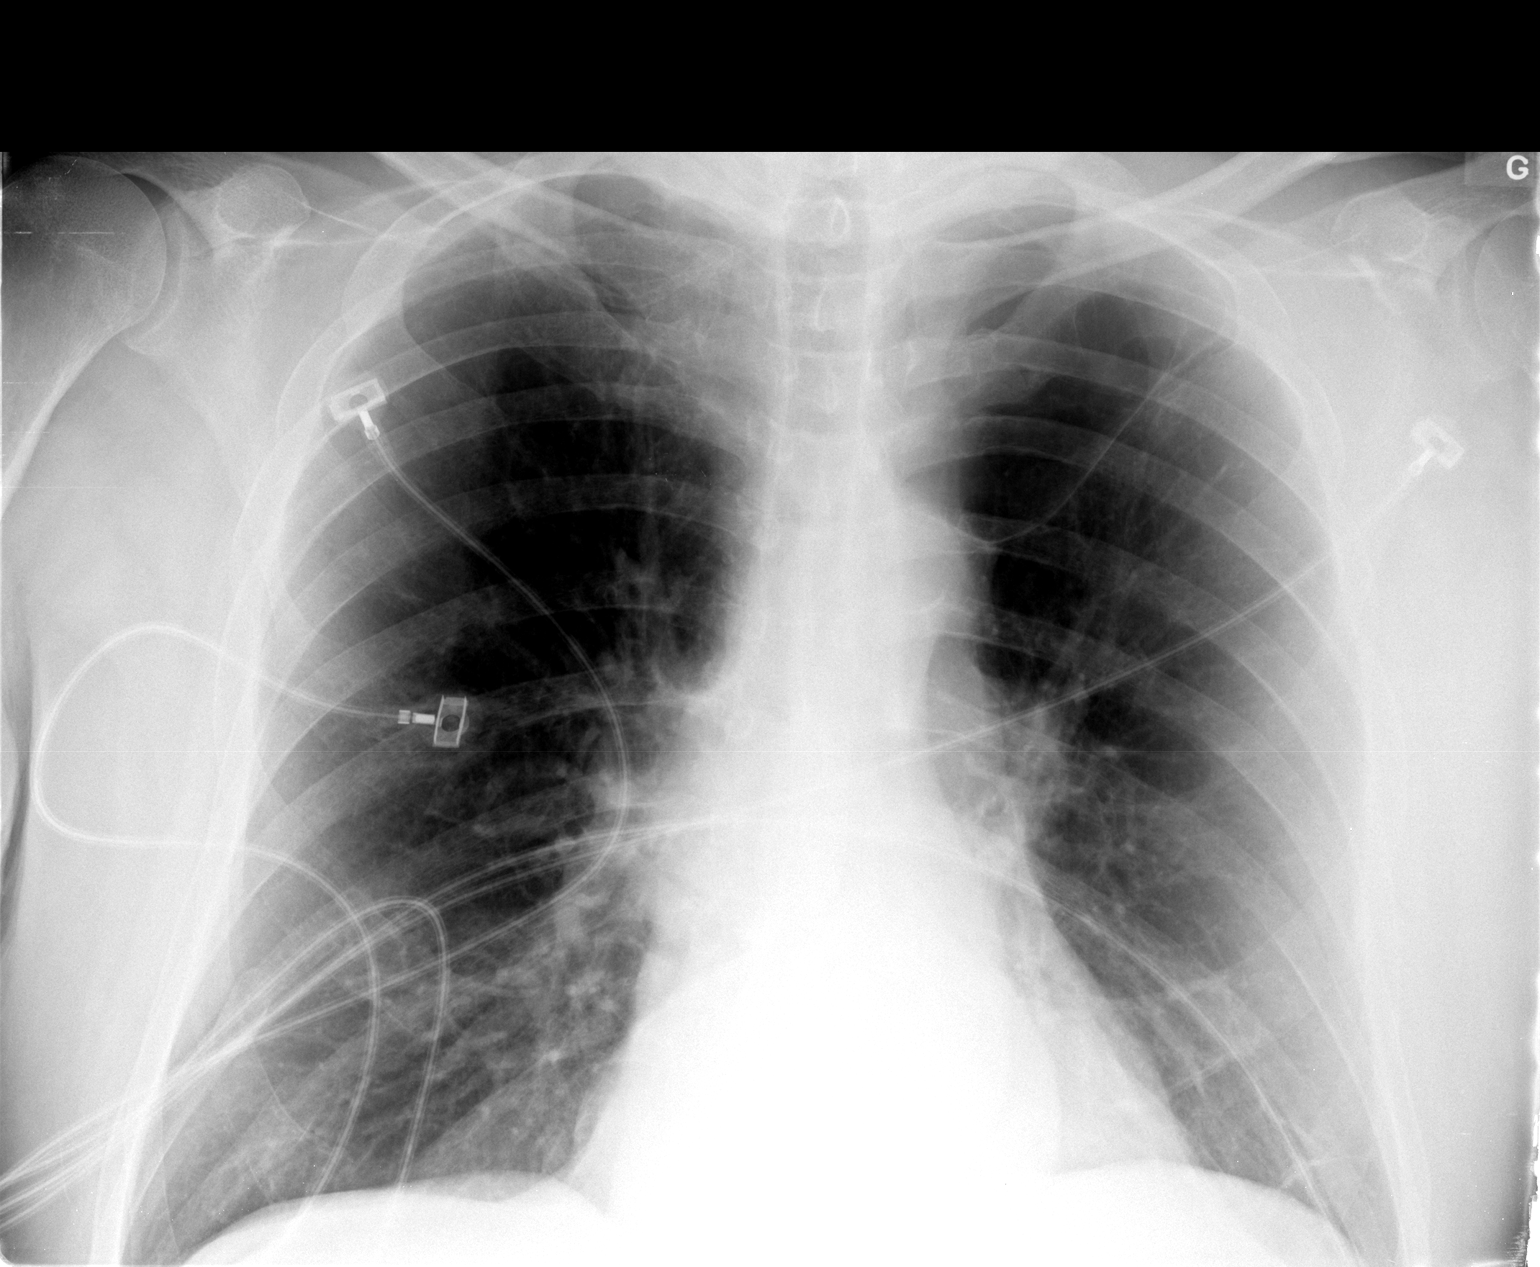

[1 of 1 positions shown; findings below may reference images not displayed]

FINDINGS: No active infiltrate or effusion is seen.  The lungs are
hyperaerated consistent with COPD.  The heart is within upper
limits normal.  There does appear to be a moderate sized hiatal
hernia present.
IMPRESSION: 1.  COPD.  No active lung disease.
2.  Moderate sized hiatal hernia.

## 2009-07-21 IMAGING — CT CT ANGIO ABDOMEN
2 of 7 series · 13 of 46 positions shown, 18 images · IV contrast (APPLIED)
Comparison: 02/04/2008 CT abdomen and pelvis.  12/08/2008 CT chest.

CTA CHEST

CLINICAL DATA: Chest pain, shortness of breath.

CT ANGIOGRAPHY CHEST, ABDOMEN AND PELVIS
TECHNIQUE: Multidetector CT imaging through the chest, abdomen and
pelvis was performed using the standard protocol during bolus
administration of intravenous contrast. Multiplanar reconstructed
images including MIPs were obtained and reviewed to evaluate the
vascular anatomy.
Contrast: 100 ml Omnipaque 350.

[Series 6: dissection 2.0 st · axial · 0.95mm/px · z∈[-548,+62]mm · 11 of 347 slices shown, 15 images]
[im 21/347  soft-tissue]
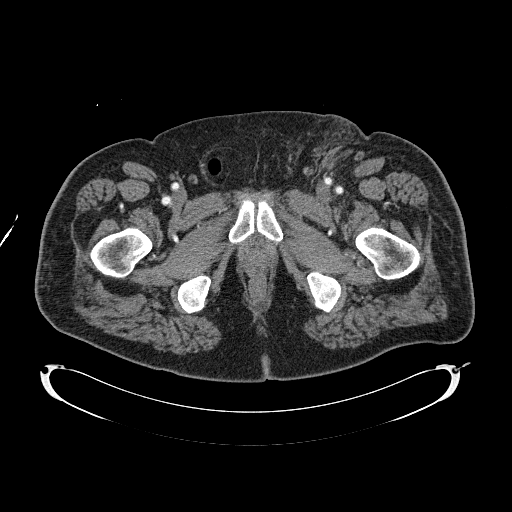
[im 21/347  bone]
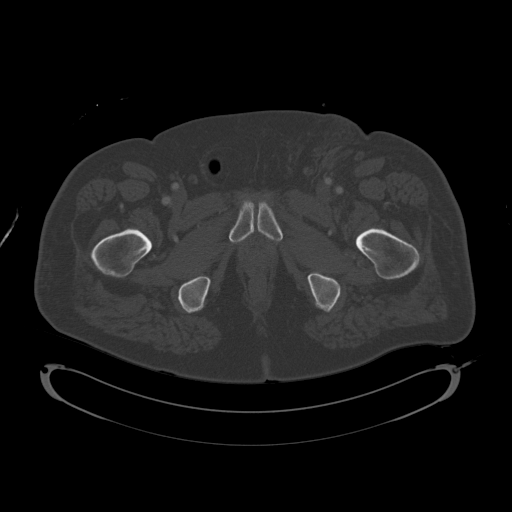
[im 62/347  soft-tissue]
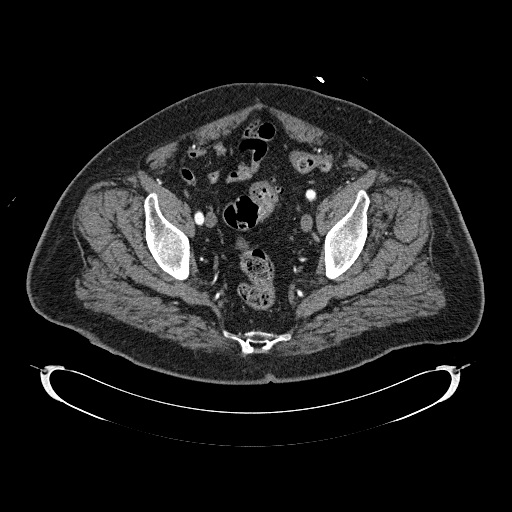
[im 102/347  soft-tissue]
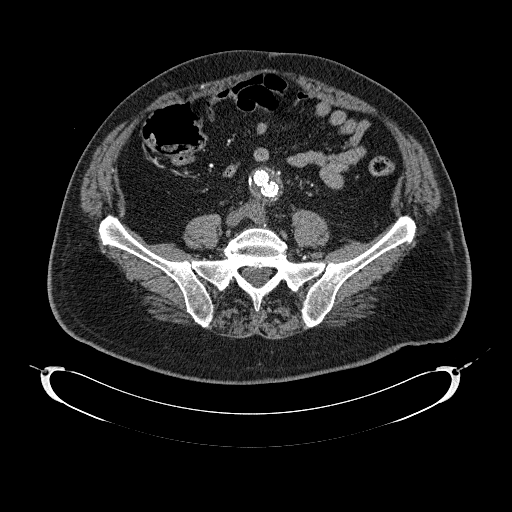
[im 143/347  soft-tissue]
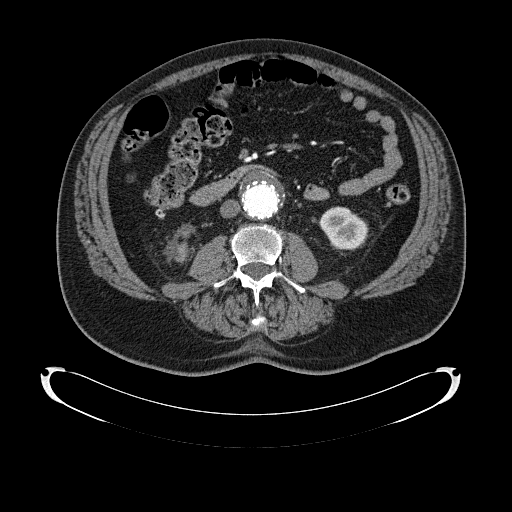
[im 184/347  soft-tissue]
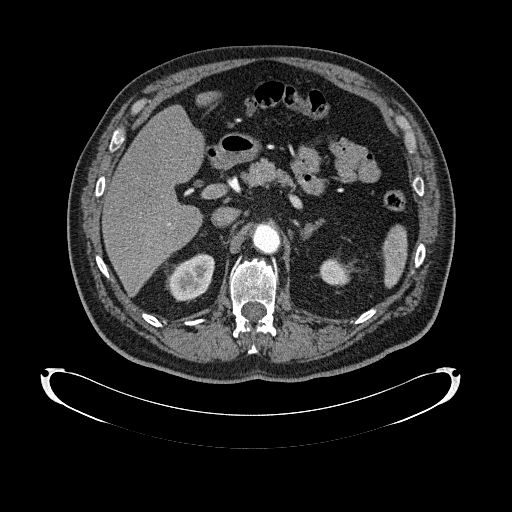
[im 204/347  soft-tissue]
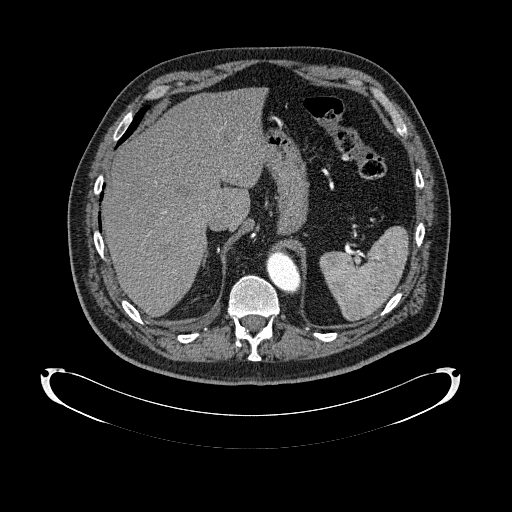
[im 245/347  soft-tissue]
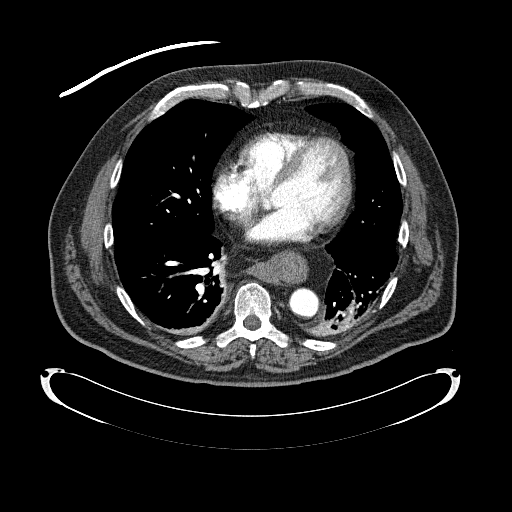
[im 265/347  lung]
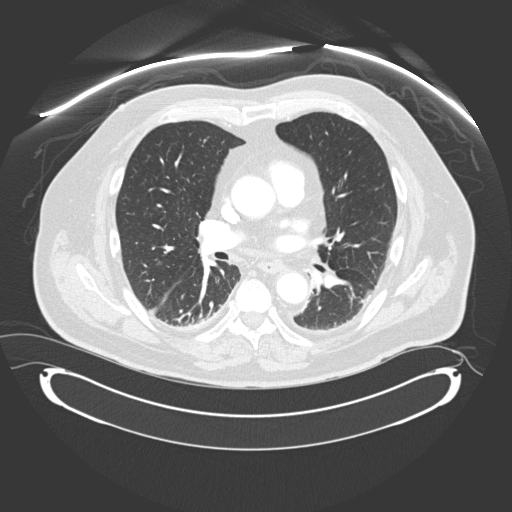
[im 285/347  soft-tissue]
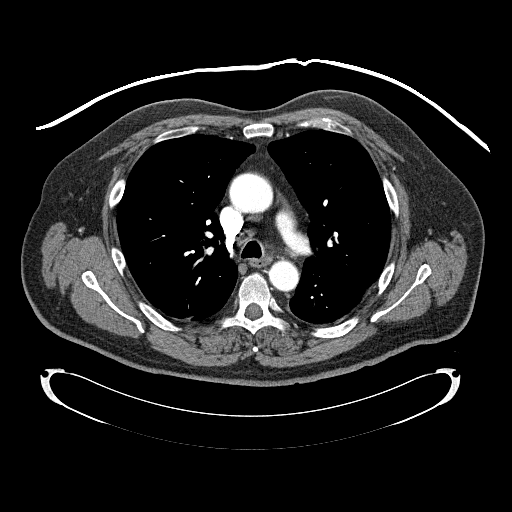
[im 285/347  lung]
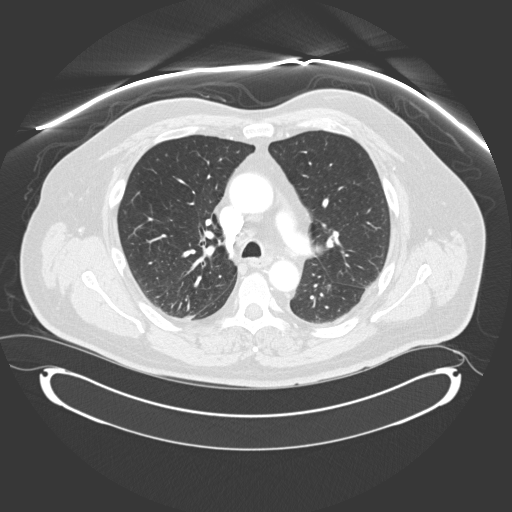
[im 306/347  lung]
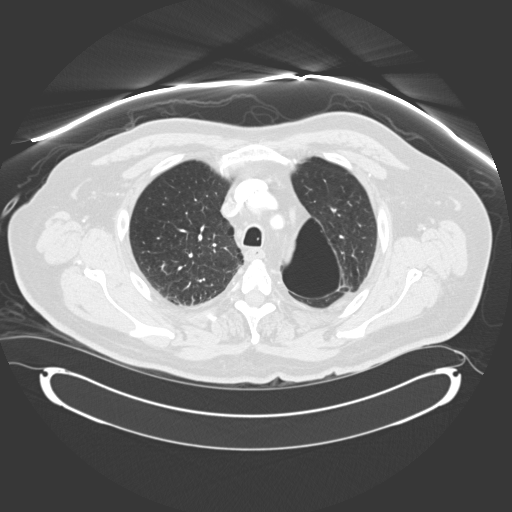
[im 326/347  soft-tissue]
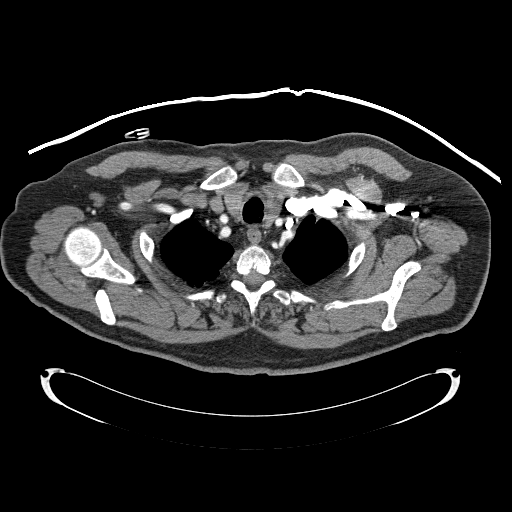
[im 326/347  lung]
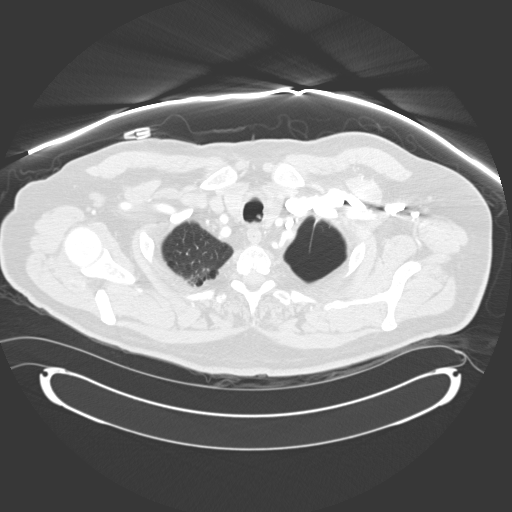
[im 326/347  bone]
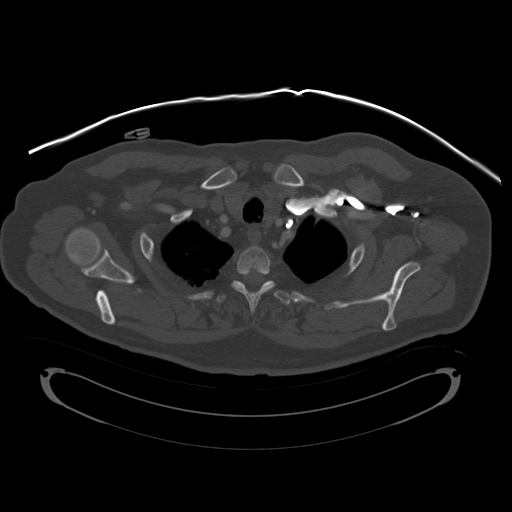

[Series 602: coronal · coronal · 1.35mm/px · 2 of 94 slices shown, 3 images]
[im 32/94  soft-tissue]
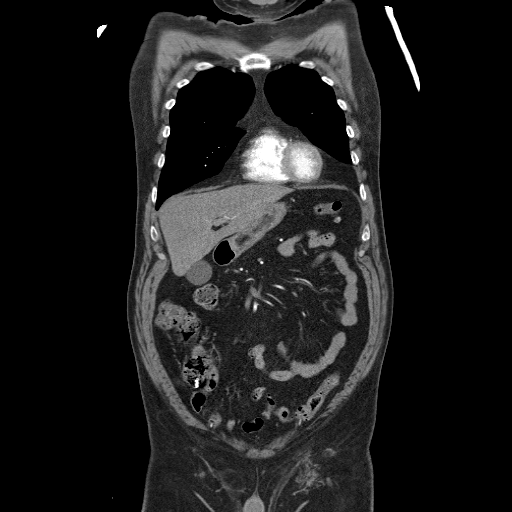
[im 32/94  bone]
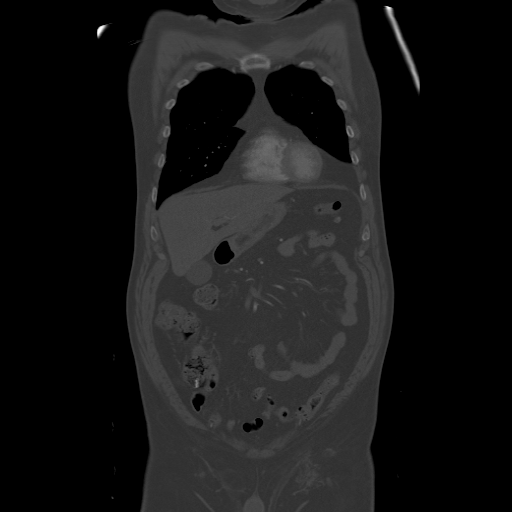
[im 63/94  soft-tissue]
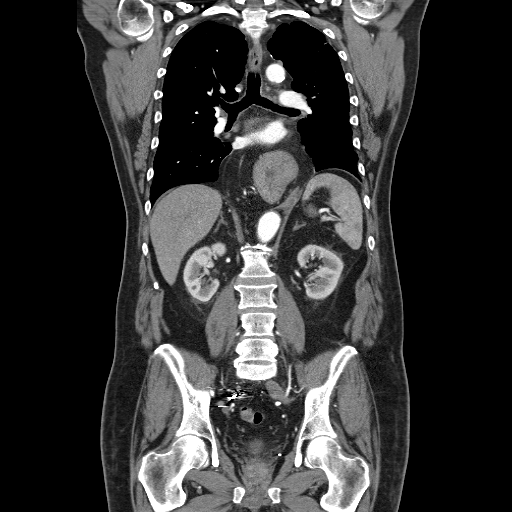

[13 of 46 positions shown; findings below may reference images not displayed]

FINDINGS: Aorta is normal caliber.  Maximum aortic diameter is at
the aortic root measuring 3.7 cm.  Minimal atherosclerotic
calcifications in the aortic arch.  Descending thoracic aorta
unremarkable. No dissection.

Heart is normal size.  Dense coronary artery calcifications are
present. Pulmonary arterial opacification is excellent.  No filling
defects to suggest pulmonary emboli.

No mediastinal, hilar, or axillary adenopathy.  Small bilateral
pleural effusions noted with compressive atelectasis in both lower
lobes.  There is a large hiatal hernia present.  Again noted is
bullous emphysematous changes in the lungs, with a large left
apical bulla noted.

Degenerative changes in the spine.  No acute bony abnormality.
IMPRESSION: No evidence of aortic aneurysm or dissection.

No evidence of pulmonary embolus.

Coronary artery disease.

Large hiatal hernia.

Small bilateral effusions and bilateral lower lobe atelectasis.

CTA ABDOMEN
FINDINGS: The patient is status post stent graft repair of the
aortic aneurysm .  Aneurysm sac size 5.4 x 5.3 cm, compared to
x 7.6 cm previously.  No evidence of endoleak.  No evidence of
dissection in the mid abdominal aorta above the graft.  No
mesenteric vessels and renal arteries opacify normally.

Scattered colonic diverticula noted.  No evidence of active
diverticulitis.  Small bowel unremarkable.

Liver, spleen, pancreas, adrenals, kidneys unremarkable.  Stable
mild bilateral perinephric stranding.  No hydronephrosis or stones.
IMPRESSION: Status post stent graft repair of the infrarenal abdominal aortic
aneurysm.  Aneurysm sac size currently measures 5.4 cm maximally,
compared to 7.7 cm previously.

Scattered colonic diverticula.

CTA PELVIS
FINDINGS: The limbs of the stent graft extend into the left common
iliac artery and right external iliac artery.  Endovascular coils
noted in the right internal iliac artery.  Left internal iliac
artery is patent.  Iliofemoral vessels are widely patent without
stenosis.  No aneurysm or dissection.

There is sigmoid diverticulosis.  Pelvic small bowel grossly
unremarkable.  No free fluid, free air, or adenopathy. The patient
is status post appendectomy.

Small right inguinal hernia noted containing a loop of small bowel.
No evidence of obstruction.  Postoperative changes noted in the
left groin with soft tissue stranding/scarring.  Previously noted
small fluid collection in the left growing has resolved.
IMPRESSION: No evidence of iliofemoral aneurysm or dissection.

Right inguinal hernia containing a small bowel loop without
evidence of obstruction, stable in appearance.

## 2009-07-23 IMAGING — CR DG CHEST 2V
2 series · 2 of 2 positions shown · non-contrast
Comparison: Chest film 12/15/2008 and chest CT scan 12/17/2008.

CLINICAL DATA: Unstable angina.  Patient for CABG.

CHEST - 2 VIEW

[w chest lat]
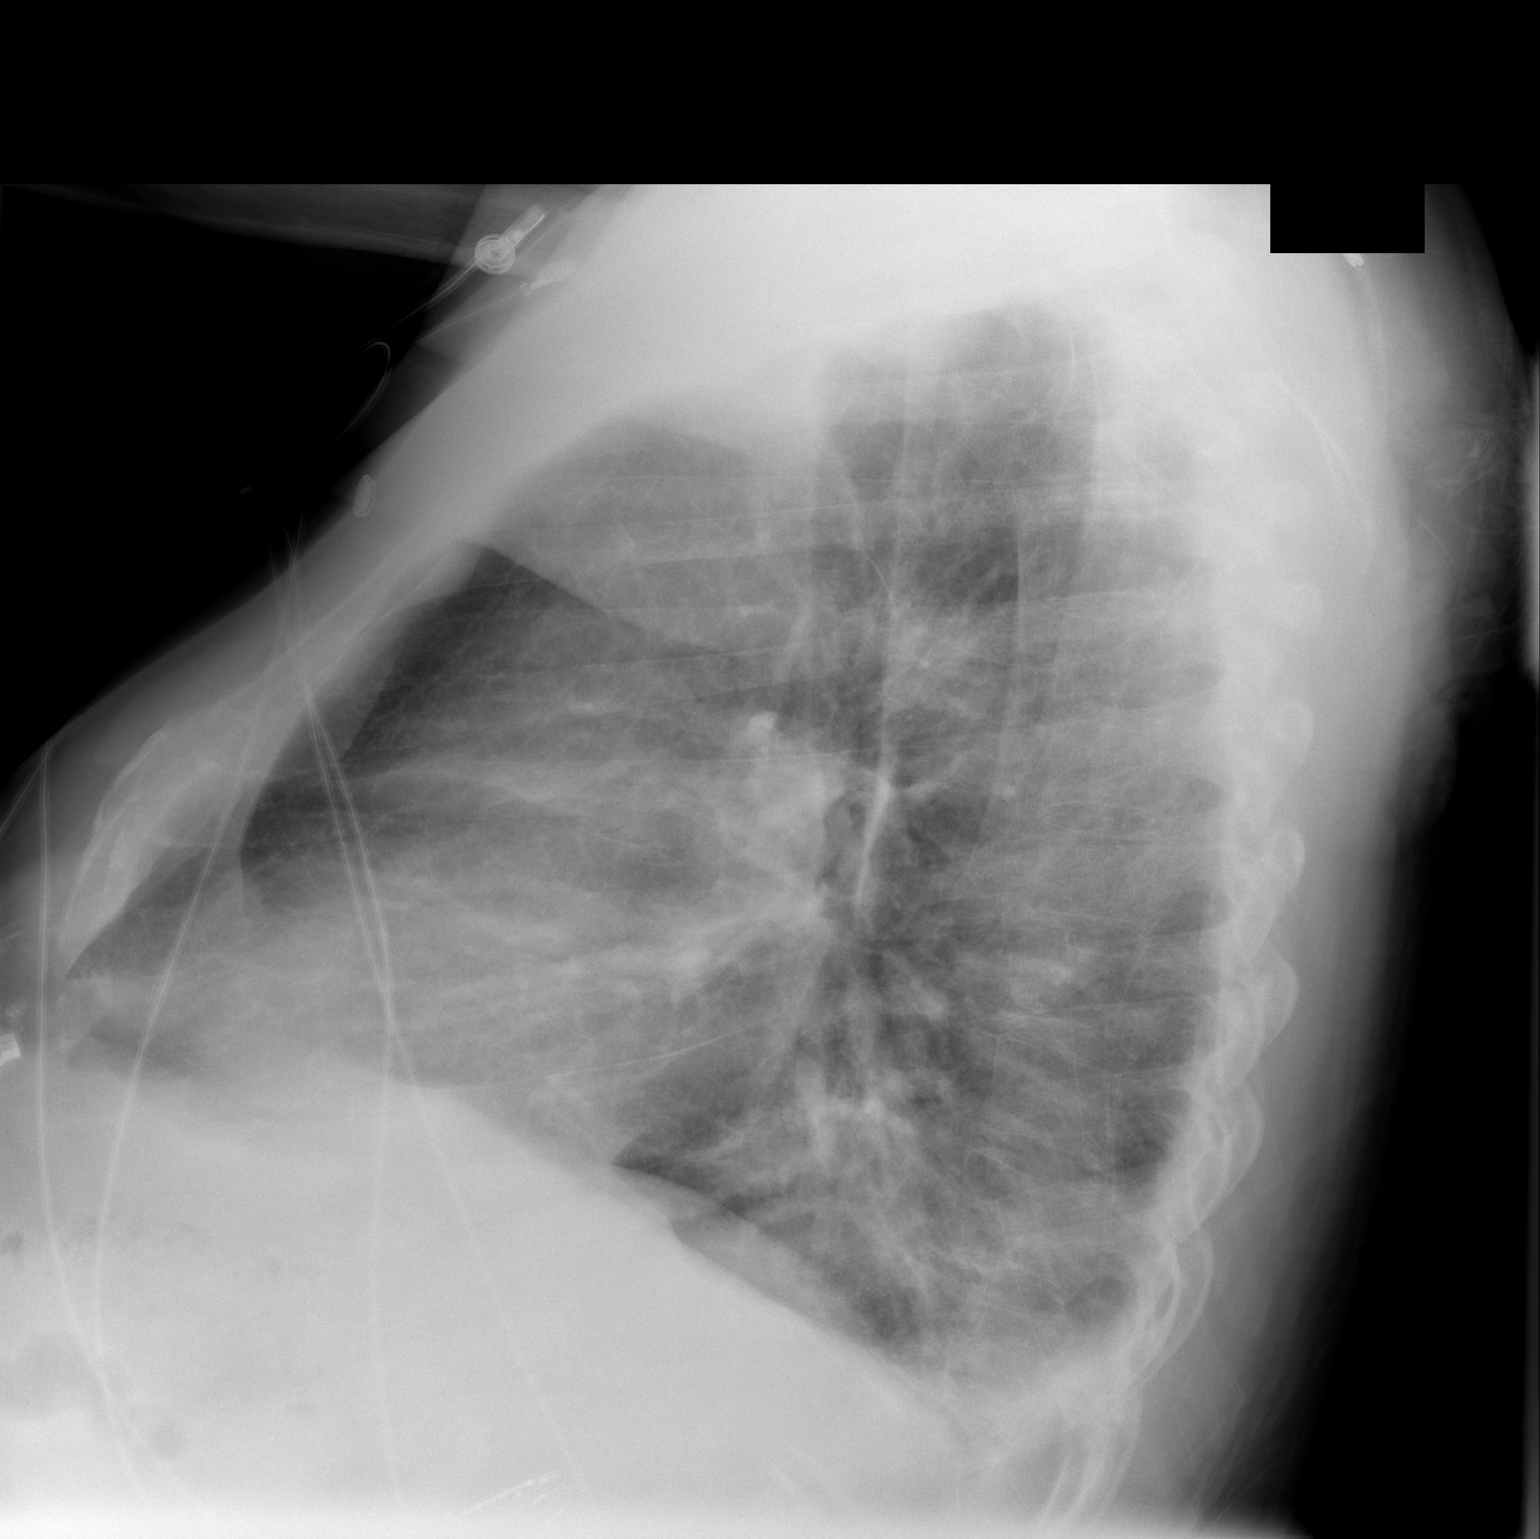

[w chest pa]
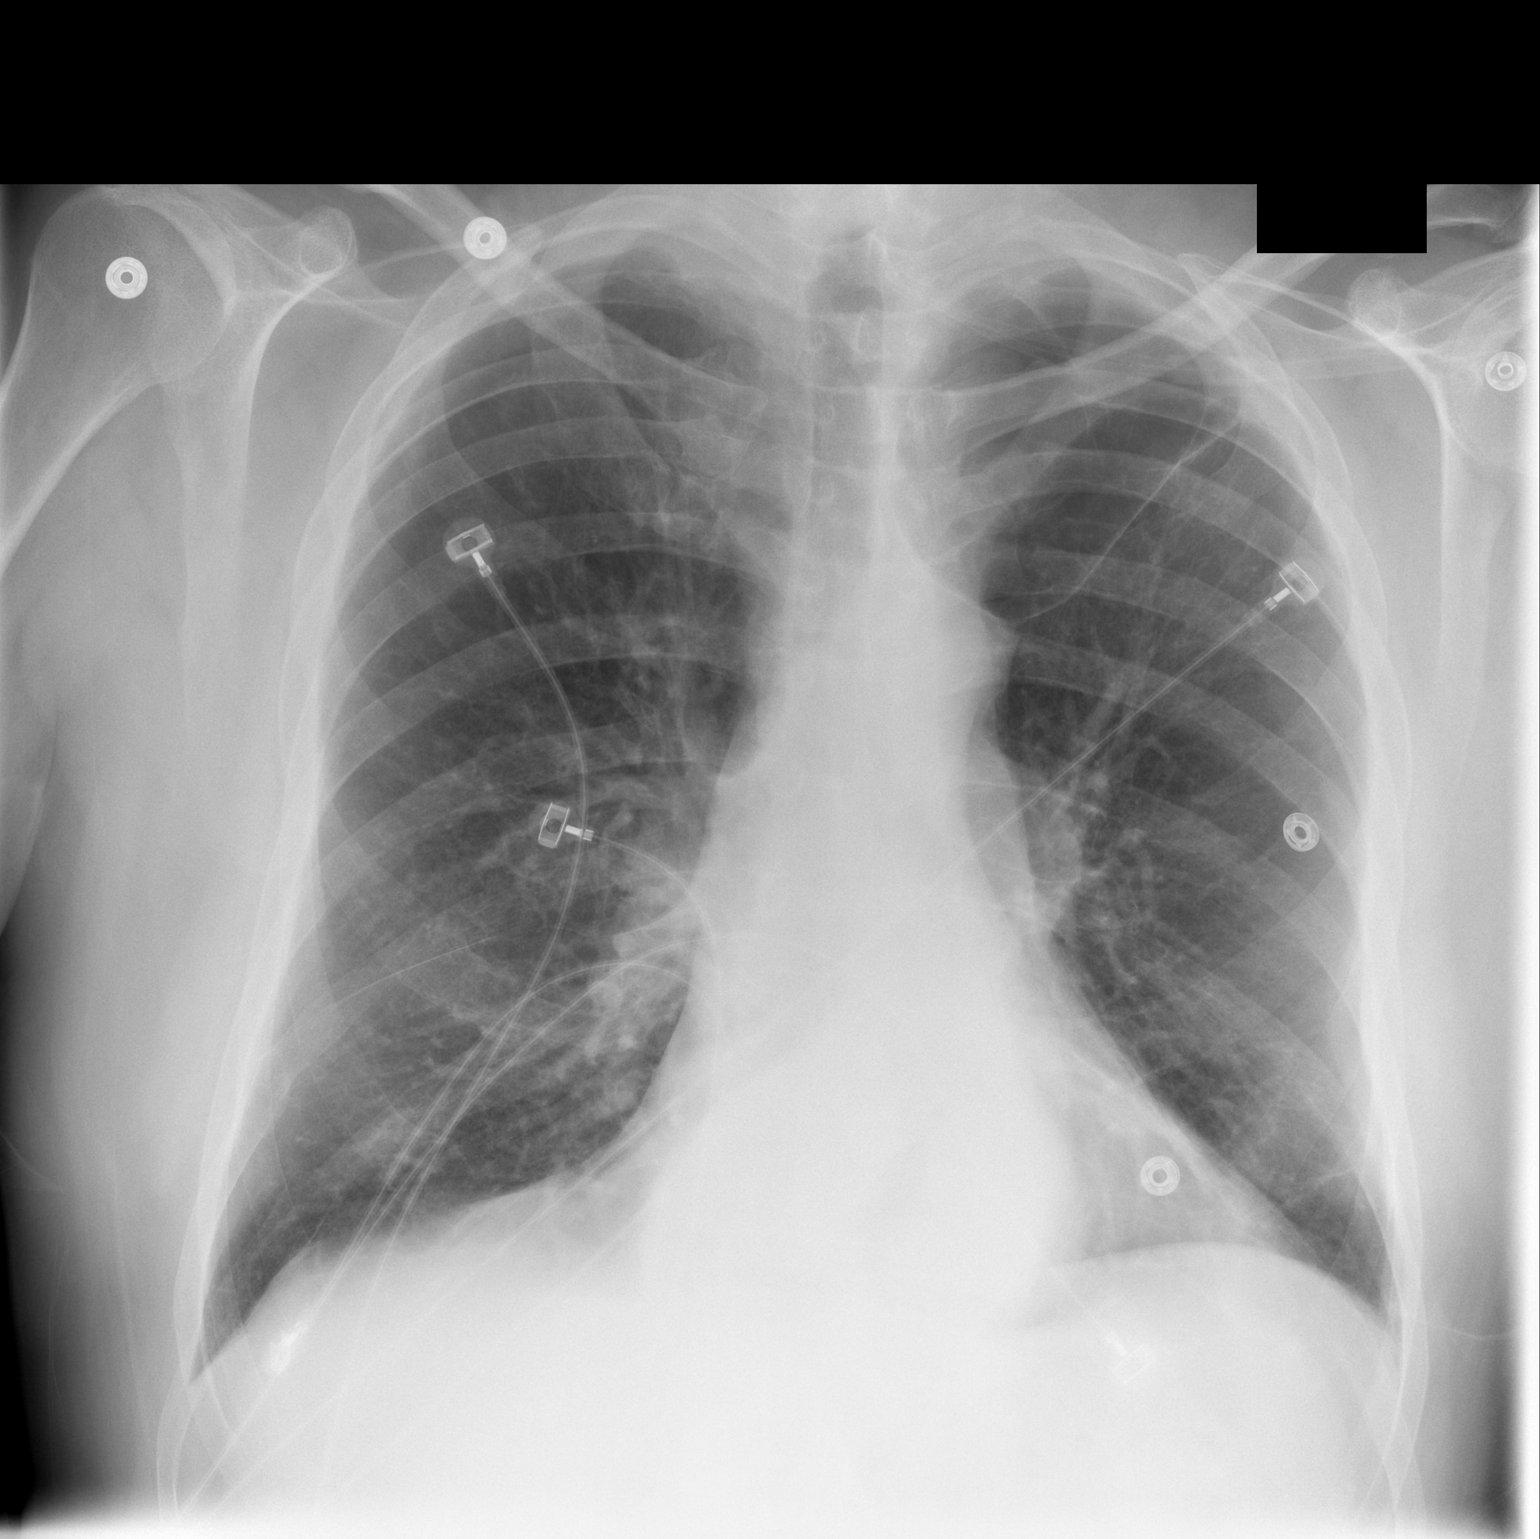

[2 of 2 positions shown; findings below may reference images not displayed]

FINDINGS: Large bullous lesion is noted in the left lung apex.
Lungs appear emphysematous.  Very small bilateral pleural effusions
are present.  Heart size normal.  Hiatal hernia noted.
IMPRESSION: 1.  Very small bilateral pleural effusions.
2.  Emphysematous appearance the lungs of the large bullous lesion
in the left apex.
3.  Hiatal hernia.

## 2009-07-26 IMAGING — CR DG CHEST 1V PORT
1 series · 1 of 1 positions shown · non-contrast
Comparison: 12/19/2008

CLINICAL DATA: Post CABG

PORTABLE CHEST - 1 VIEW

[view not recorded]
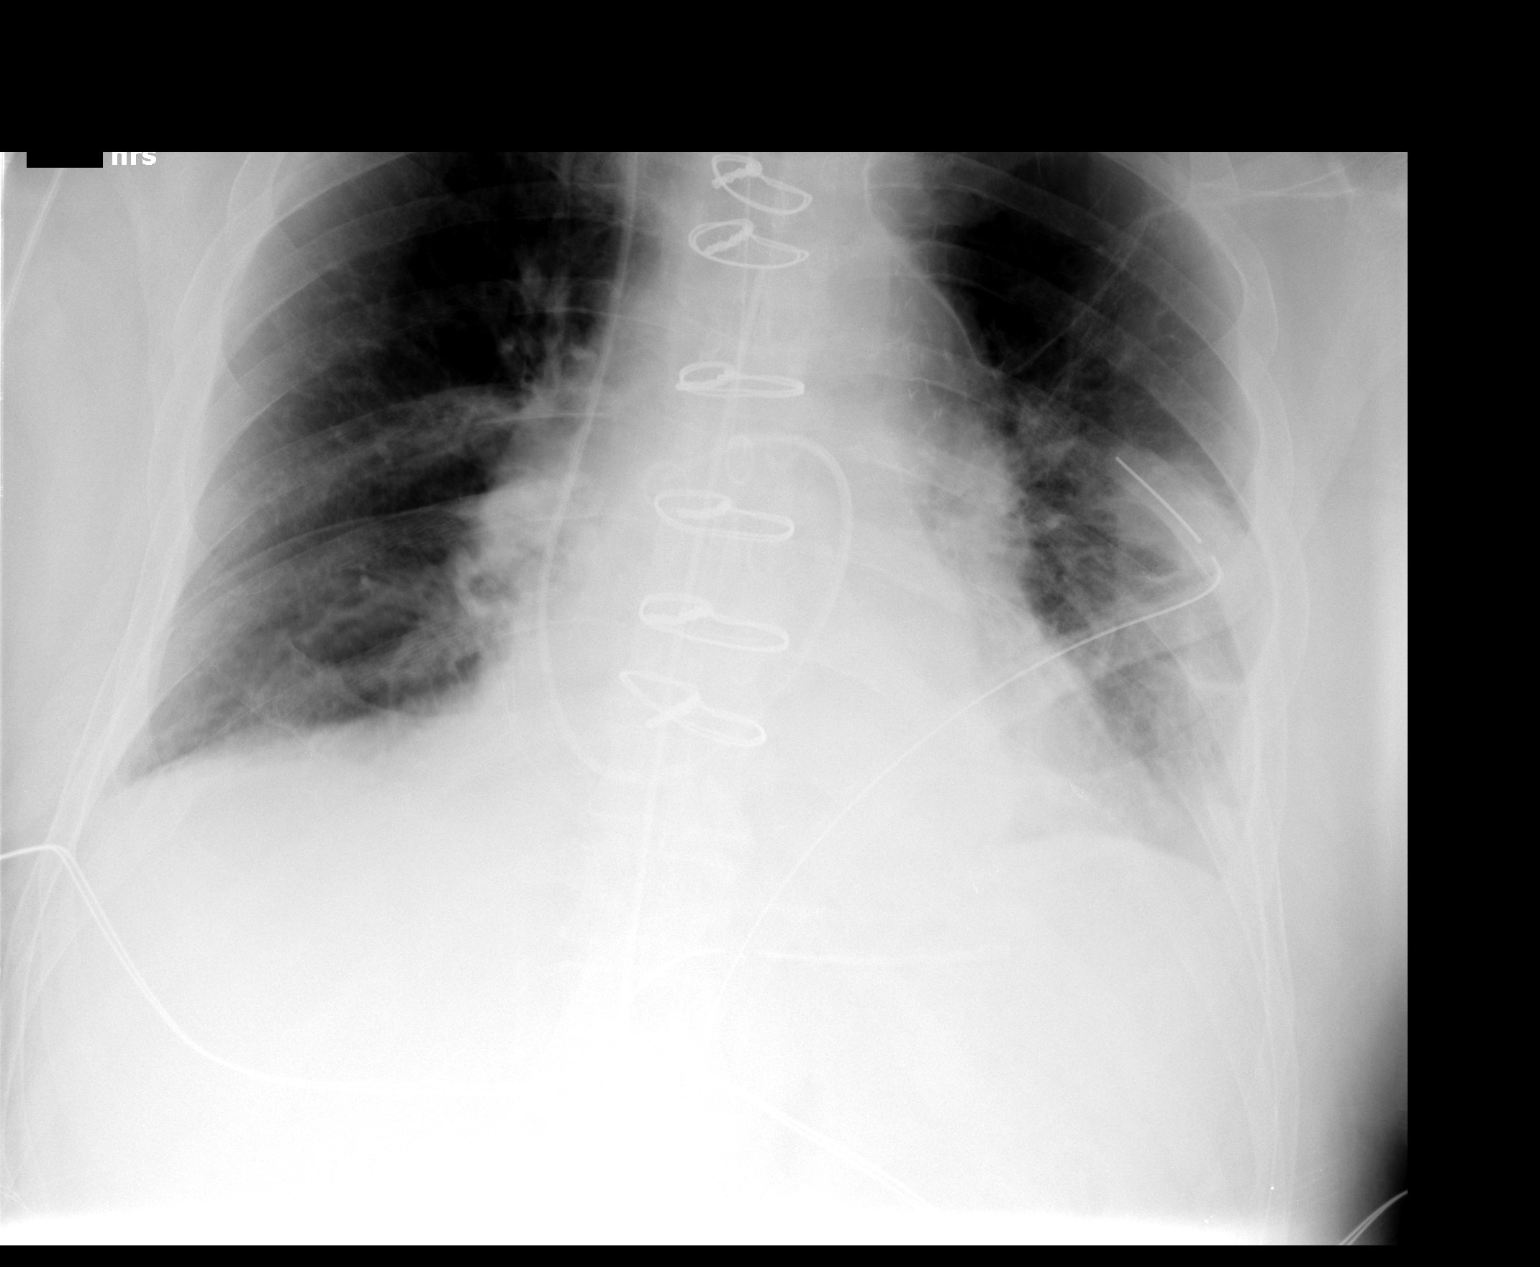

[1 of 1 positions shown; findings below may reference images not displayed]

FINDINGS: Status post CABG with customary tubes and lines in place.
No visible pneumothorax on the left, where there is an indwelling
chest tube.  No definite pneumothorax on the right, although the
right lung apex is not included on the image.

There is mild bibasilar atelectasis.  No congestive heart failure
or volume overload.
IMPRESSION: 1.  Mild postoperative atelectasis.
2.  Satisfactory position of support apparatus.
3.  The right lung apex is not included on the exam, but there is
no obvious pneumothorax.

## 2009-07-27 IMAGING — CR DG CHEST 1V PORT
1 series · 1 of 1 positions shown · non-contrast
Comparison: 12/22/2008

CLINICAL DATA: The bypass surgery.

PORTABLE CHEST - 1 VIEW

[view not recorded]
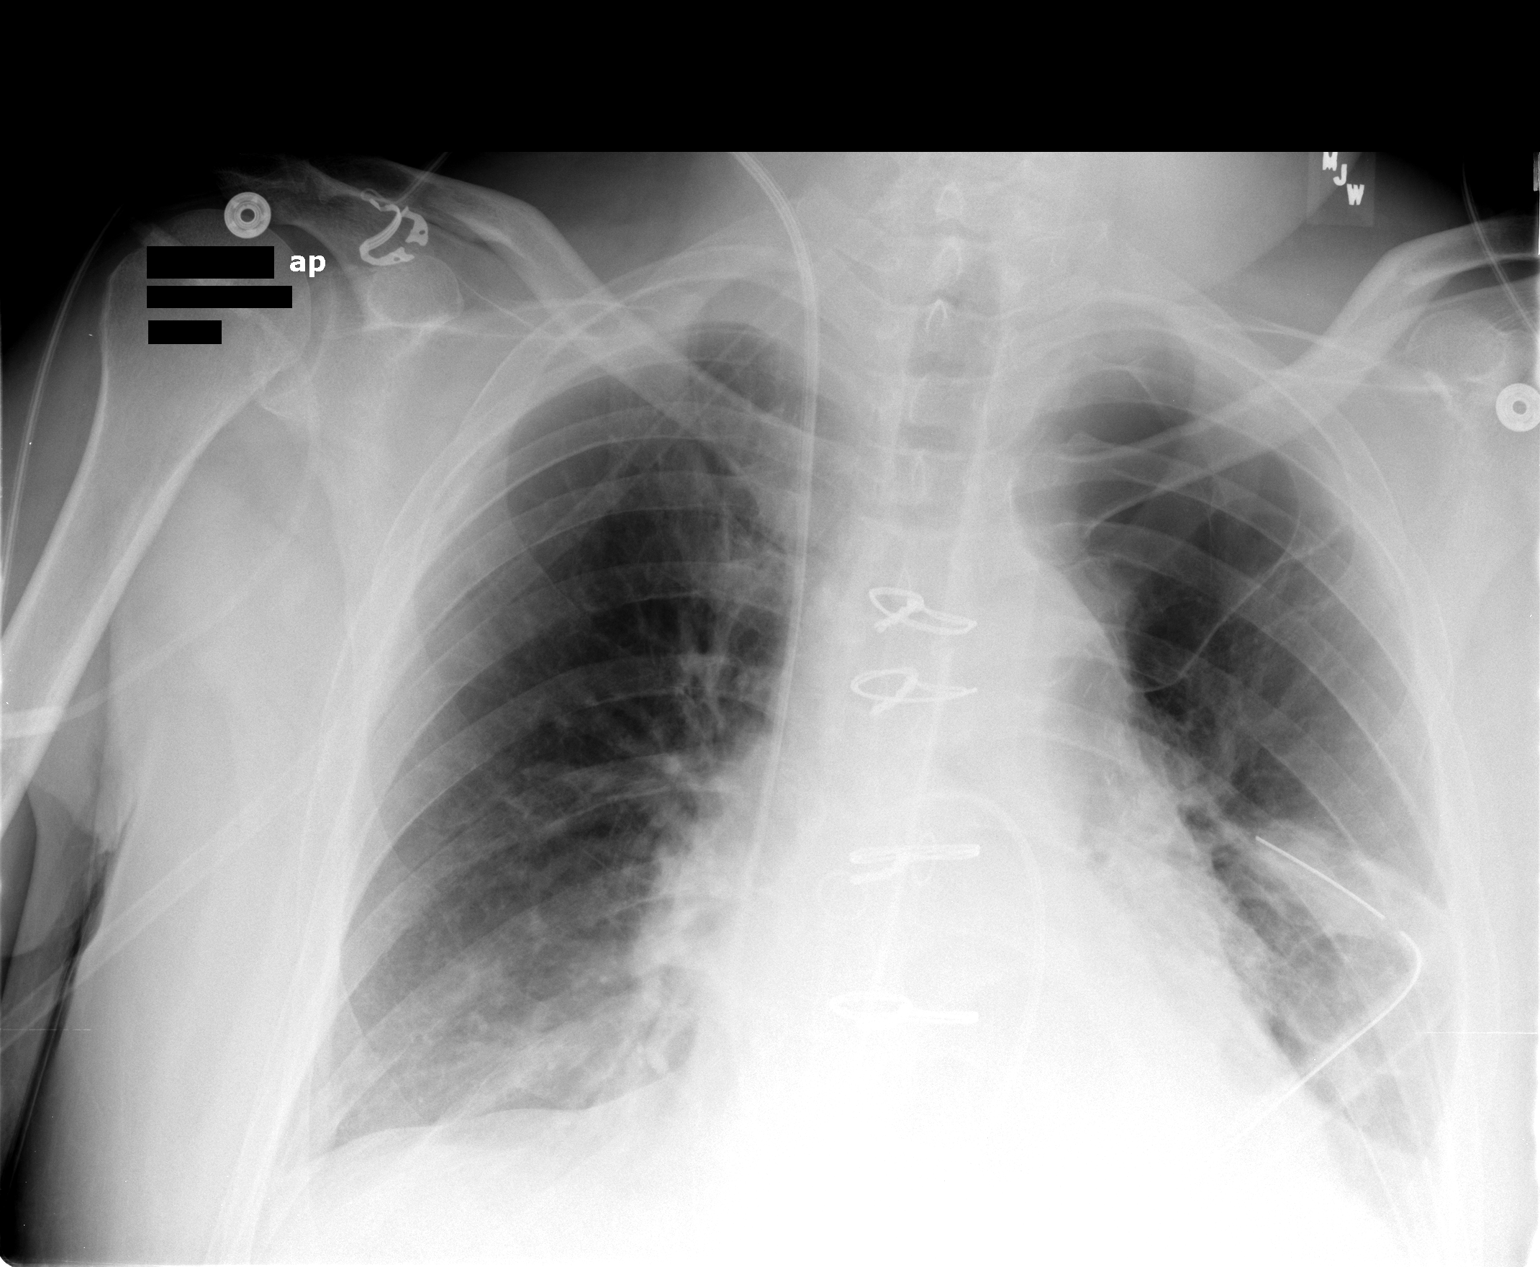

[1 of 1 positions shown; findings below may reference images not displayed]

FINDINGS: The endotracheal tube and NG tubes have been removed.
The Swan-Ganz catheter is still in place with its tip in the
proximal right pulmonary artery.  Left chest tube is stable.  No
pneumothorax.

Persistent streaky bibasilar atelectasis and probable small
effusions.
IMPRESSION: 1.  Removal of ET and NG tubes.
2.  Stable position of the Swan-Ganz catheter and left chest tube.
3.  Persistent streaky areas of atelectasis and probable small
bilateral pleural effusions.

## 2009-07-28 IMAGING — CR DG CHEST 1V PORT
1 series · 1 of 1 positions shown · non-contrast
Comparison: 12/23/2008 study

CLINICAL DATA: History given of unstable angina, acute coronary
syndrome, and coronary bypass grafting.

PORTABLE CHEST - 1 VIEW

[AP]
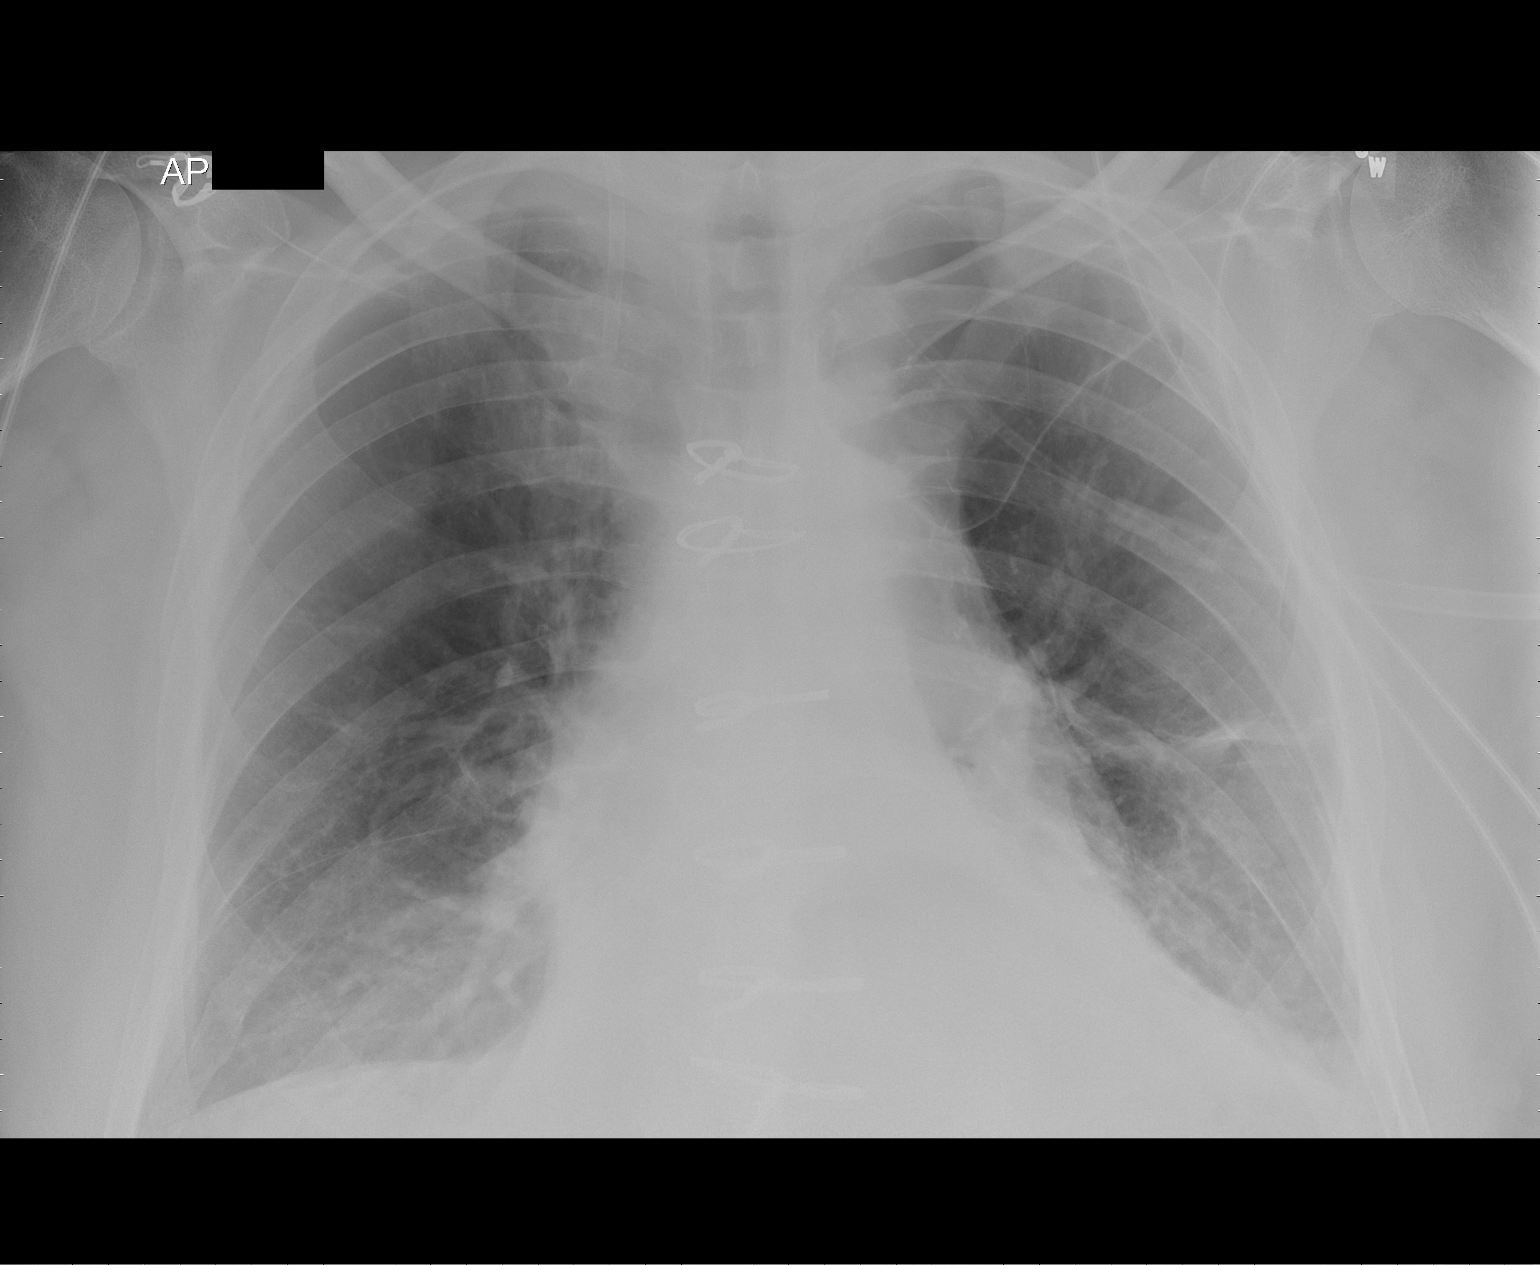

[1 of 1 positions shown; findings below may reference images not displayed]

FINDINGS: Right internal jugular venous catheter now has tip in
superior vena cava.  Left chest tube has been removed.  Median
sternotomy and coronary bypass graft surgery have been performed.
There is stable moderate enlargement cardiac silhouette.
Atelectasis is seen in the left mid and lower lung zone with patchy
infiltrative density and small left pleural effusion or thickening.
There is minimal atelectasis in the right base.  Ectasia of
thoracic aorta is seen.  Old healed right rib trauma is present.
IMPRESSION: Right internal jugular venous catheter now has tip in superior vena
cava.  Left chest tube has been removed.  There is stable moderate
enlargement of the cardiac silhouette.  Atelectasis is seen in the
left mid and lower lung zone with patchy infiltrative density and
small left pleural effusion or thickening.  There is minimal
atelectasis in the right base.

## 2009-07-29 IMAGING — CR DG CHEST 2V
2 series · 2 of 2 positions shown · non-contrast
Comparison: Portable chest x-ray of 12/24/2008

CLINICAL DATA: Weakness, status post CABG

CHEST - 2 VIEW

[w chest pa]
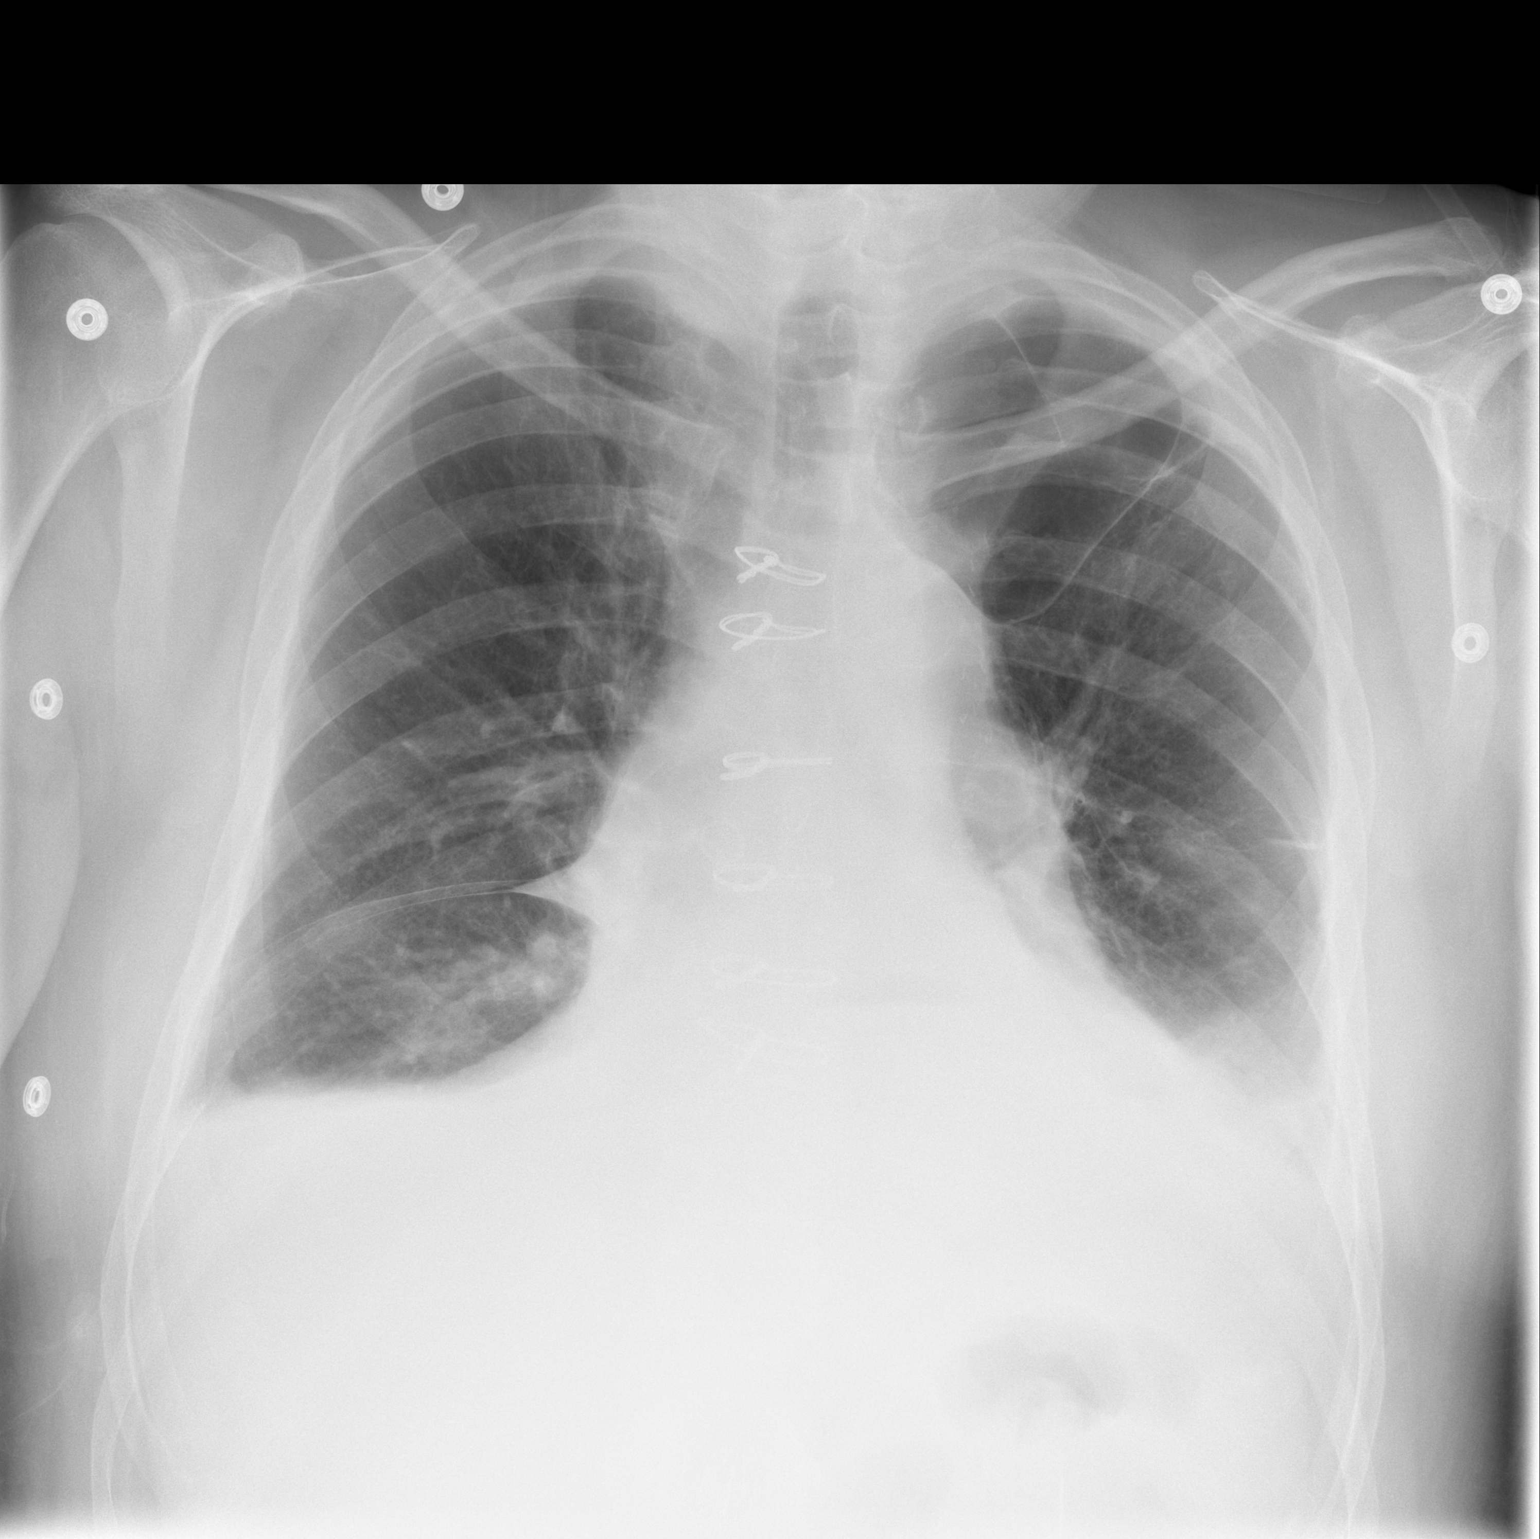

[w chest lat]
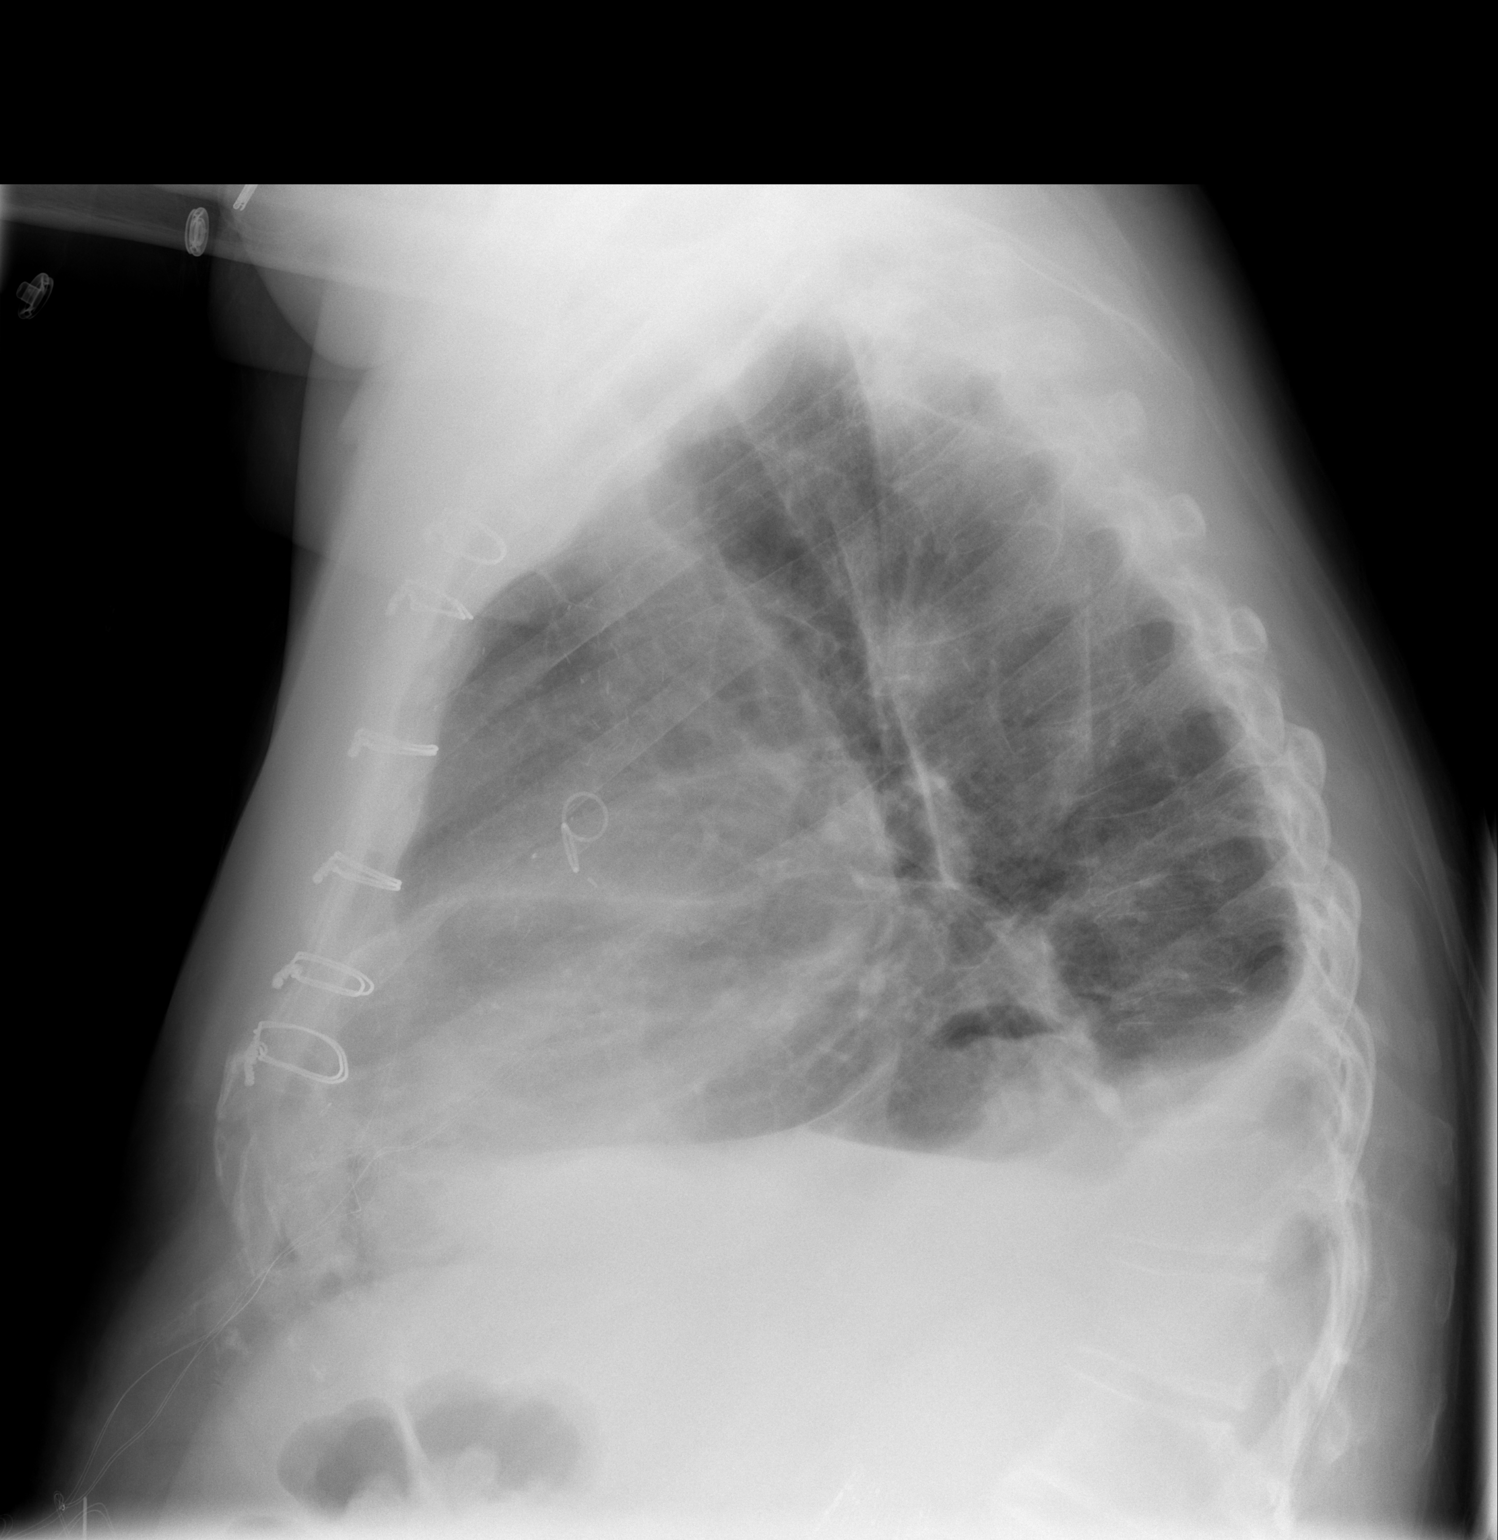

[2 of 2 positions shown; findings below may reference images not displayed]

FINDINGS: The venous sheath has been removed from the SVC.  No
pneumothorax is seen with mild apical pleural thickening remaining.
There is little change in opacities at the bases consistent with
atelectasis and effusions.  Cardiomegaly is stable.  A large
bullous formation in the left apex is stable.
IMPRESSION: Slight increase in basilar opacities consistent with atelectasis
and effusions.

## 2009-07-31 IMAGING — CR DG CHEST 2V
1 series · 1 of 1 positions shown · non-contrast
Comparison: 12/25/2008

CLINICAL DATA: Chest pain.  Cough.  CABG 4 days ago.

CHEST - 2 VIEW

[w chest lat]
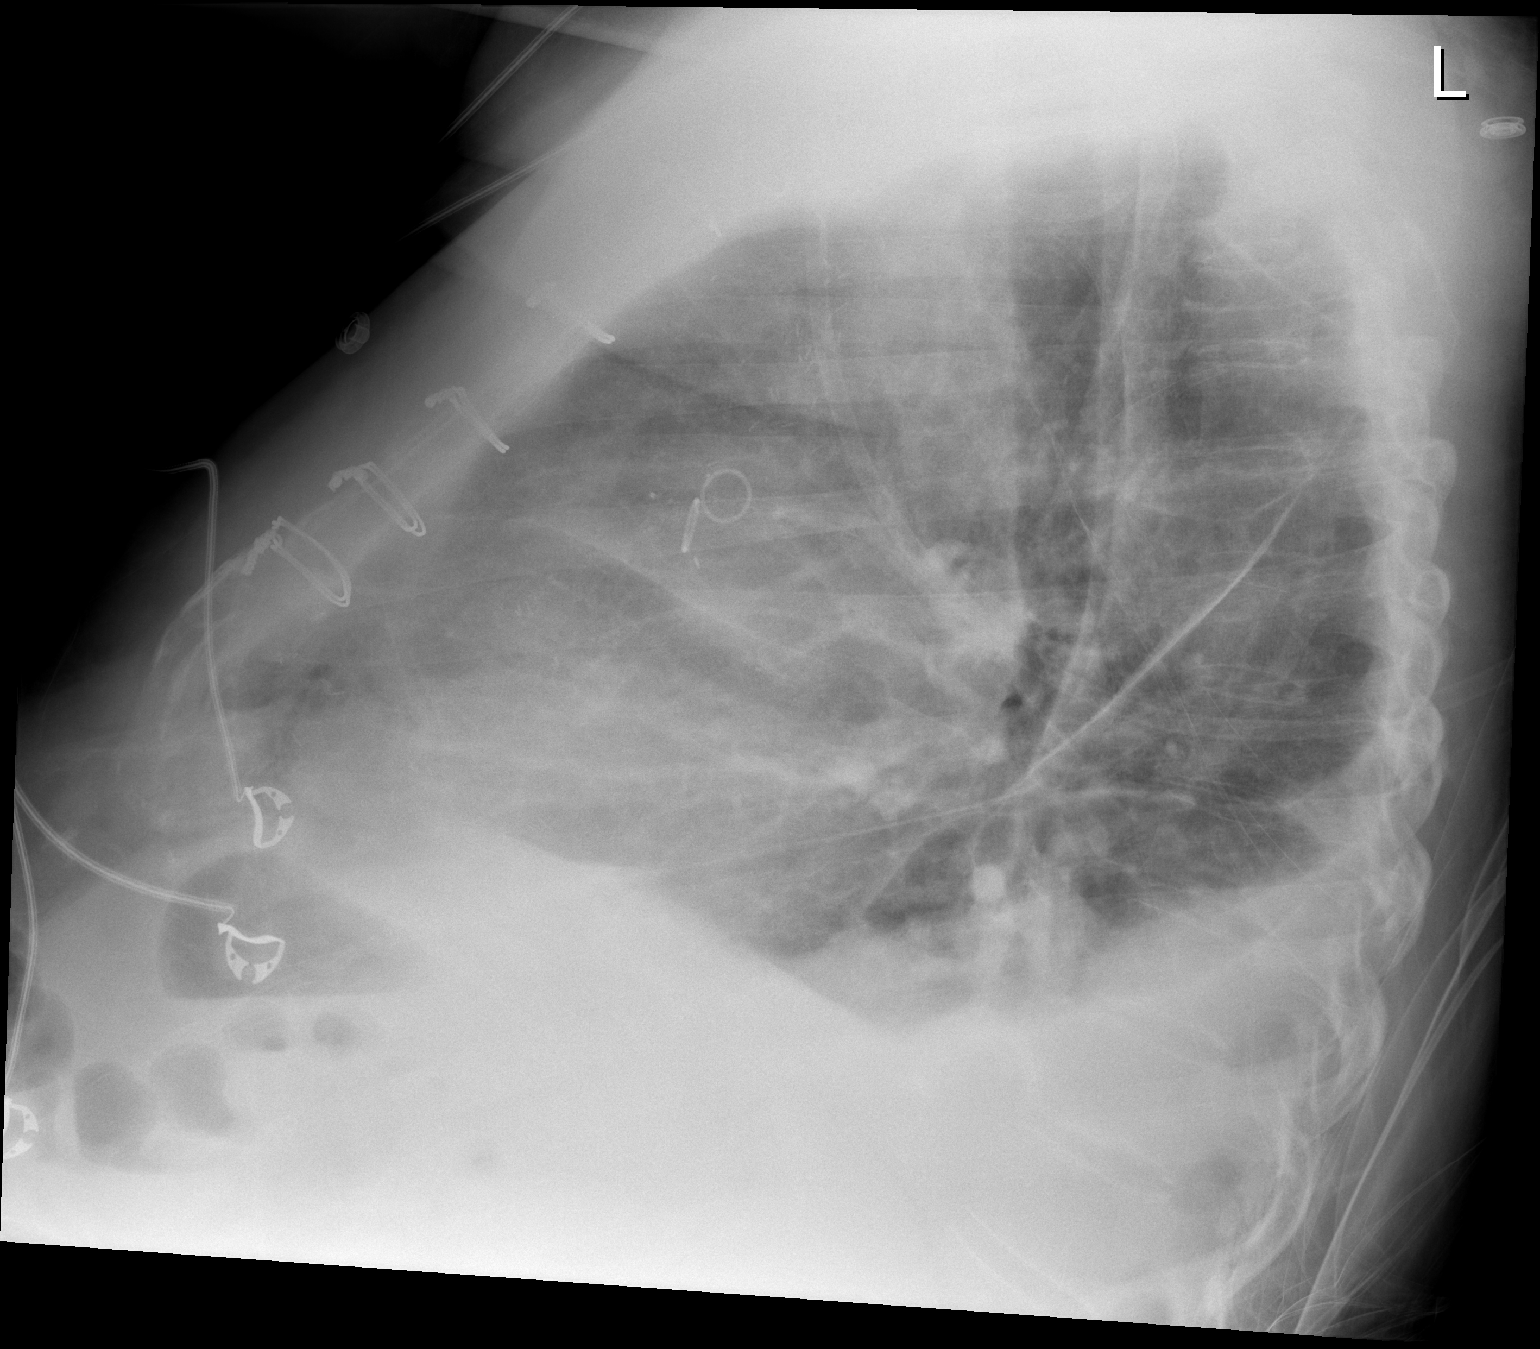

[1 of 1 positions shown; findings below may reference images not displayed]

FINDINGS: Mild cardiomegaly noted with persistent left lower lobe
airspace opacity probably representing atelectasis.  Small
bilateral pleural effusions persist.  A stable left apical bulla is
noted.  Overall the size of the effusions are minimally increased
compared the prior exam.  There is also a moderate sized hiatal
hernia.
IMPRESSION: 1.  Mild increase in the size of the bilateral pleural effusions.
There is atelectasis of the left lower lobe in addition to passive
atelectasis associated with the pleural effusions.
2.  Stable left apical bulla.
3.  Moderate hiatal hernia.

## 2009-08-18 ENCOUNTER — Ambulatory Visit: Payer: Self-pay | Admitting: Pulmonary Disease

## 2009-08-18 DIAGNOSIS — J438 Other emphysema: Secondary | ICD-10-CM | POA: Insufficient documentation

## 2009-08-18 DIAGNOSIS — J309 Allergic rhinitis, unspecified: Secondary | ICD-10-CM | POA: Insufficient documentation

## 2009-08-18 DIAGNOSIS — E785 Hyperlipidemia, unspecified: Secondary | ICD-10-CM

## 2009-08-18 DIAGNOSIS — I10 Essential (primary) hypertension: Secondary | ICD-10-CM

## 2009-08-18 DIAGNOSIS — I219 Acute myocardial infarction, unspecified: Secondary | ICD-10-CM | POA: Insufficient documentation

## 2009-08-18 DIAGNOSIS — I499 Cardiac arrhythmia, unspecified: Secondary | ICD-10-CM | POA: Insufficient documentation

## 2009-08-18 DIAGNOSIS — Z8679 Personal history of other diseases of the circulatory system: Secondary | ICD-10-CM | POA: Insufficient documentation

## 2009-08-18 HISTORY — DX: Essential (primary) hypertension: I10

## 2009-08-23 IMAGING — CR DG CHEST 2V
2 series · 2 of 2 positions shown · non-contrast
Comparison: Chest x-ray of 12/27/2008

CLINICAL DATA: History of CABG in December 2008, follow-up

CHEST - 2 VIEW

[w chest pa]
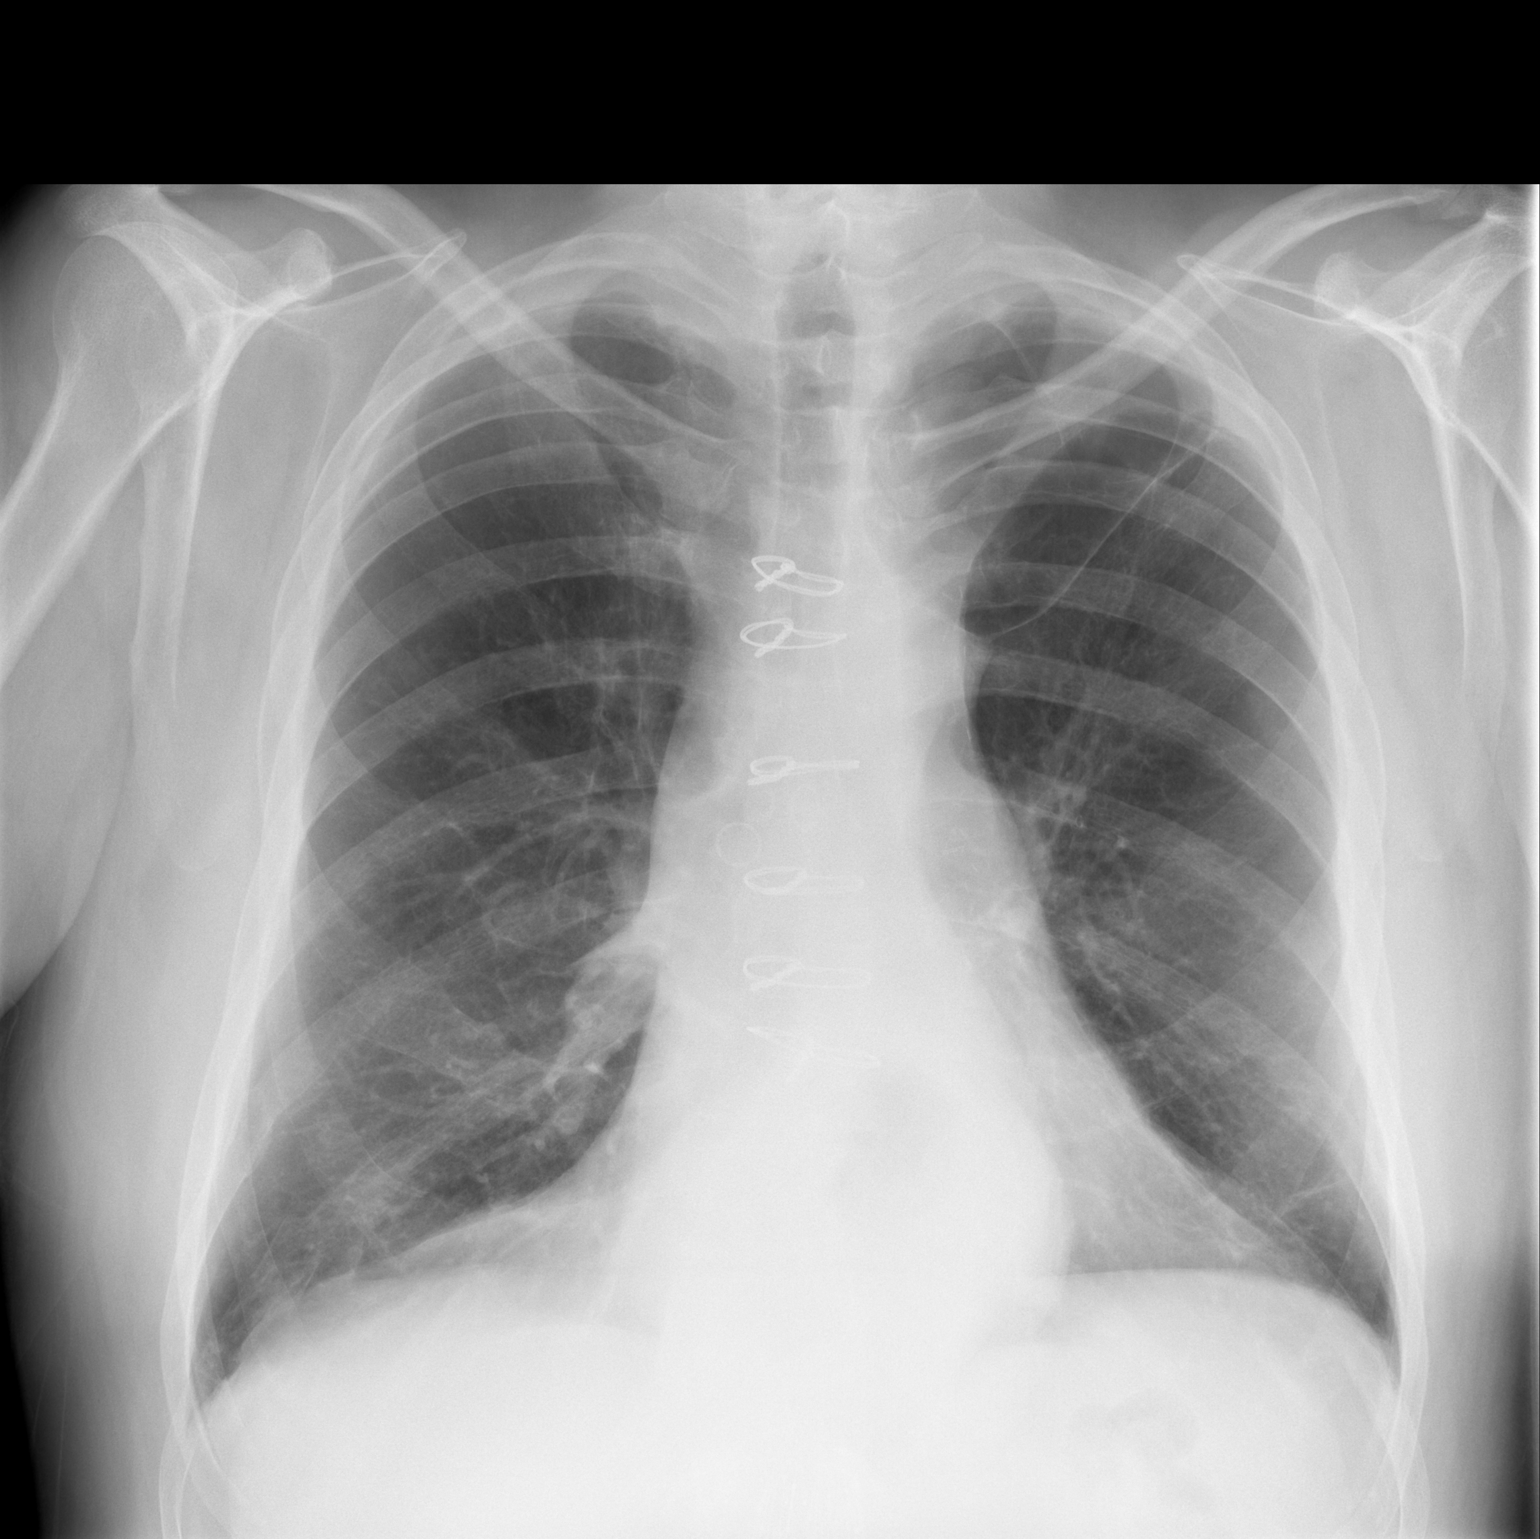

[w chest lat]
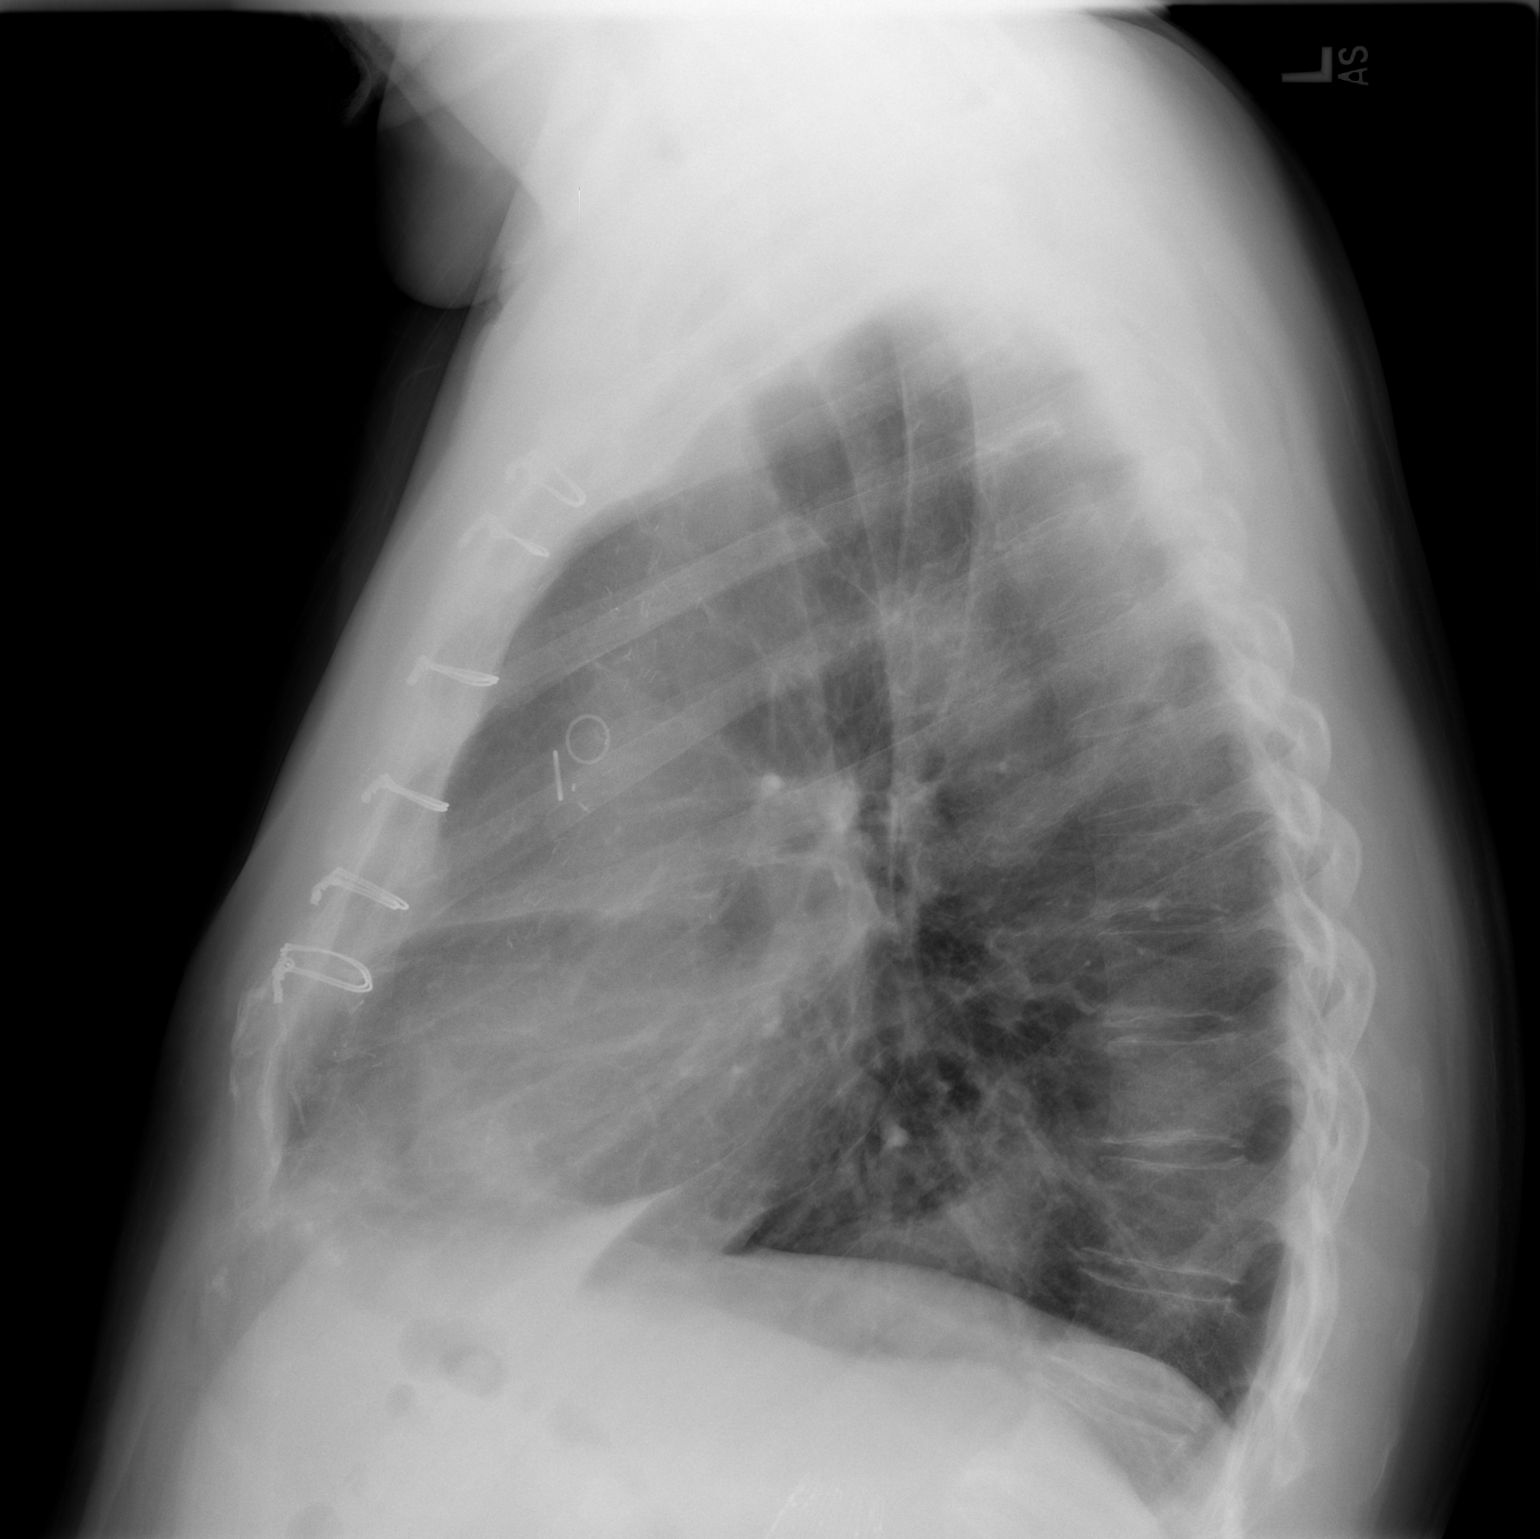

[2 of 2 positions shown; findings below may reference images not displayed]

FINDINGS: Aeration has improved with resolution of small effusions
and basilar atelectasis.  No pneumothorax is seen.  Mild
cardiomegaly is stable as is a moderate sized hiatal hernia.
Median sternotomy sutures are noted.
IMPRESSION: Improved aeration with resolution of small effusions and basilar
atelectasis.

## 2009-08-31 ENCOUNTER — Ambulatory Visit: Payer: Self-pay | Admitting: Surgery

## 2009-09-15 ENCOUNTER — Telehealth: Payer: Self-pay | Admitting: Pulmonary Disease

## 2009-09-30 ENCOUNTER — Encounter: Admission: RE | Admit: 2009-09-30 | Discharge: 2009-10-14 | Payer: Self-pay | Admitting: Nurse Practitioner

## 2009-11-12 ENCOUNTER — Encounter: Admission: RE | Admit: 2009-11-12 | Discharge: 2009-11-12 | Payer: Self-pay | Admitting: Family Medicine

## 2009-12-22 ENCOUNTER — Observation Stay (HOSPITAL_COMMUNITY): Admission: EM | Admit: 2009-12-22 | Discharge: 2009-12-24 | Payer: Self-pay | Admitting: Emergency Medicine

## 2010-01-02 ENCOUNTER — Inpatient Hospital Stay (HOSPITAL_COMMUNITY): Admission: EM | Admit: 2010-01-02 | Discharge: 2010-01-05 | Payer: Self-pay | Admitting: Emergency Medicine

## 2010-01-02 ENCOUNTER — Encounter (INDEPENDENT_AMBULATORY_CARE_PROVIDER_SITE_OTHER): Payer: Self-pay | Admitting: Internal Medicine

## 2010-01-27 ENCOUNTER — Encounter: Admission: RE | Admit: 2010-01-27 | Discharge: 2010-01-27 | Payer: Self-pay | Admitting: Family Medicine

## 2010-01-29 ENCOUNTER — Ambulatory Visit: Payer: Self-pay | Admitting: Pulmonary Disease

## 2010-01-31 IMAGING — CR DG CHEST 2V
2 series · 2 of 2 positions shown · non-contrast
Comparison: Chest x-ray of 01/19/2009

CLINICAL DATA: Cough, shortness of breath, left lower anterior rib
pain

CHEST - 2 VIEW

[w chest pa]
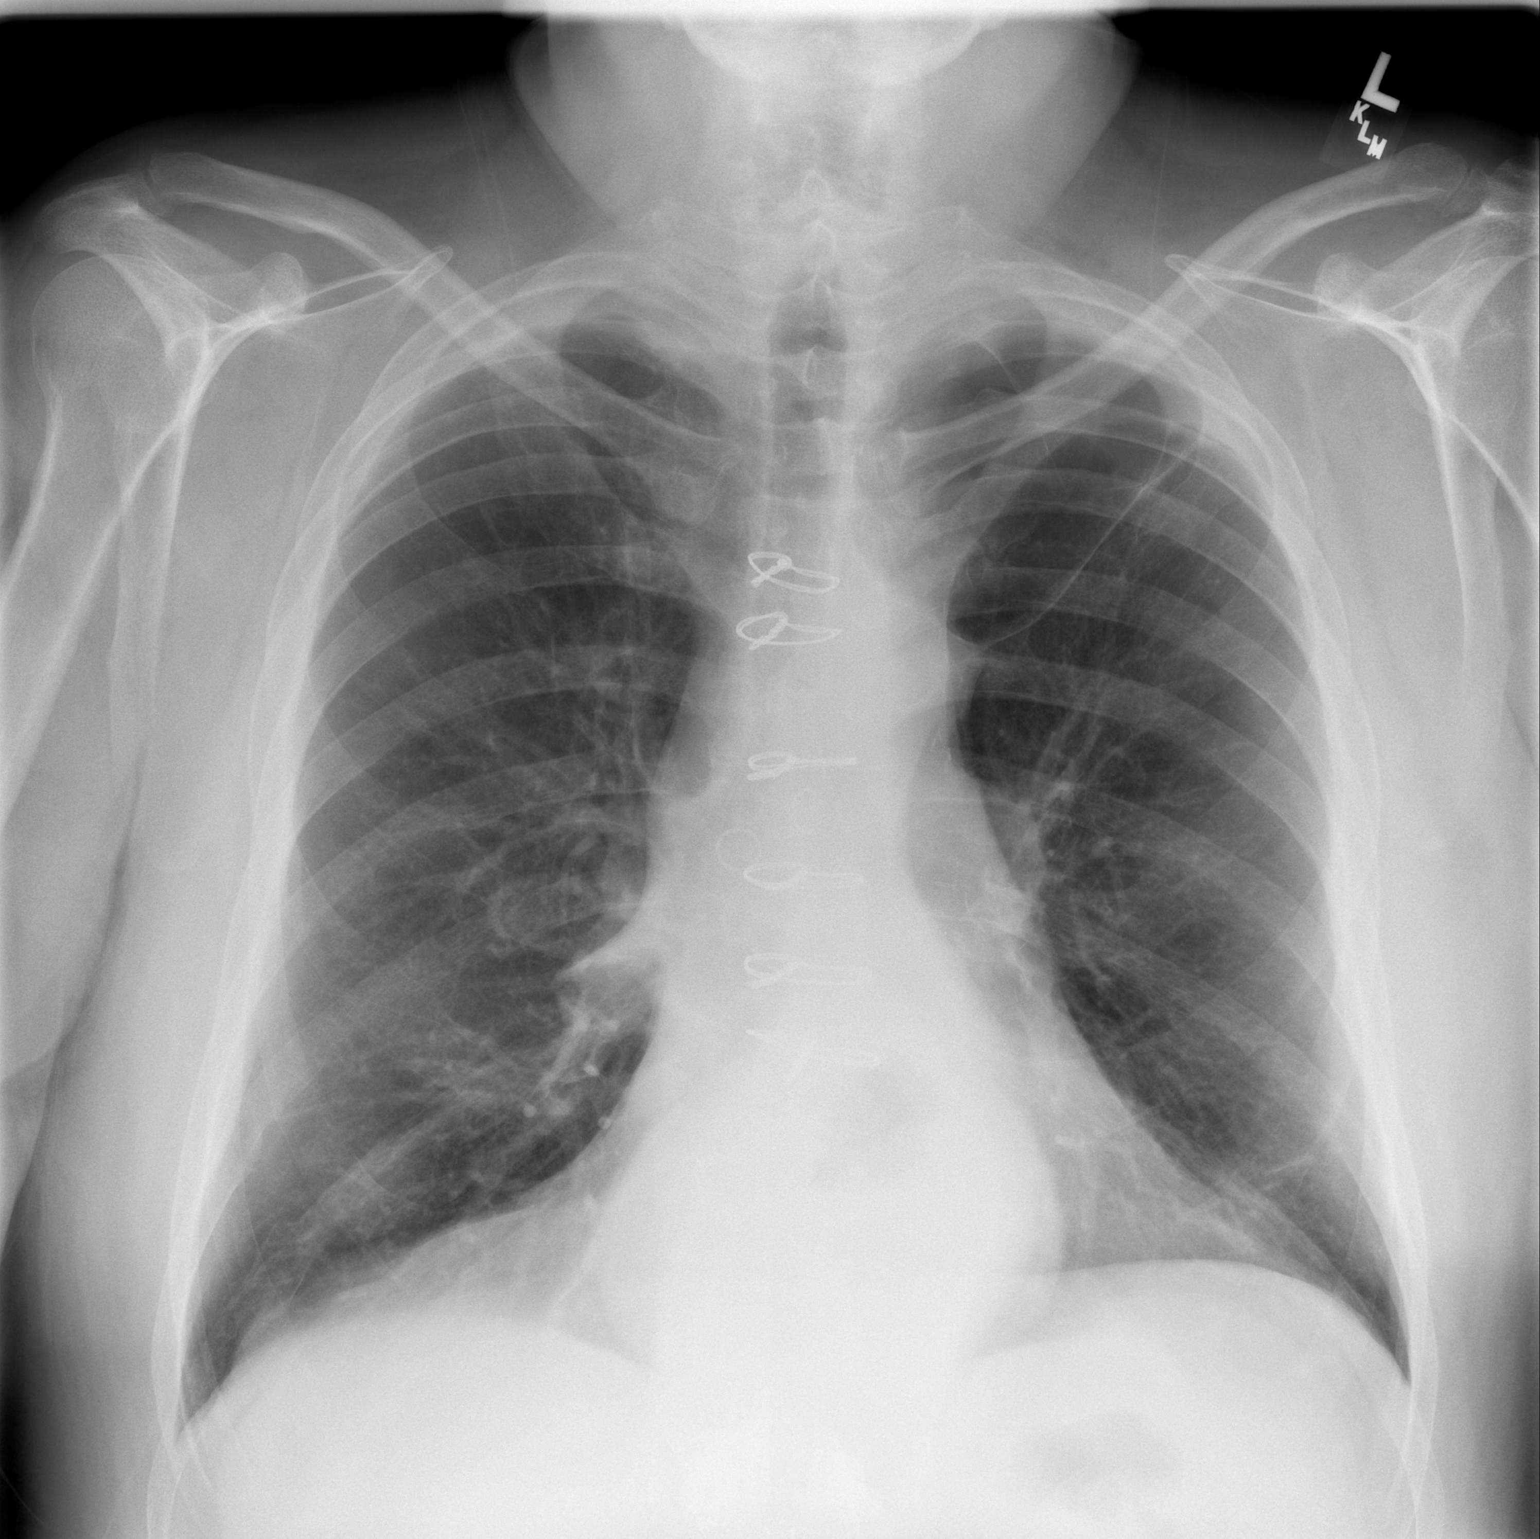

[w chest lat]
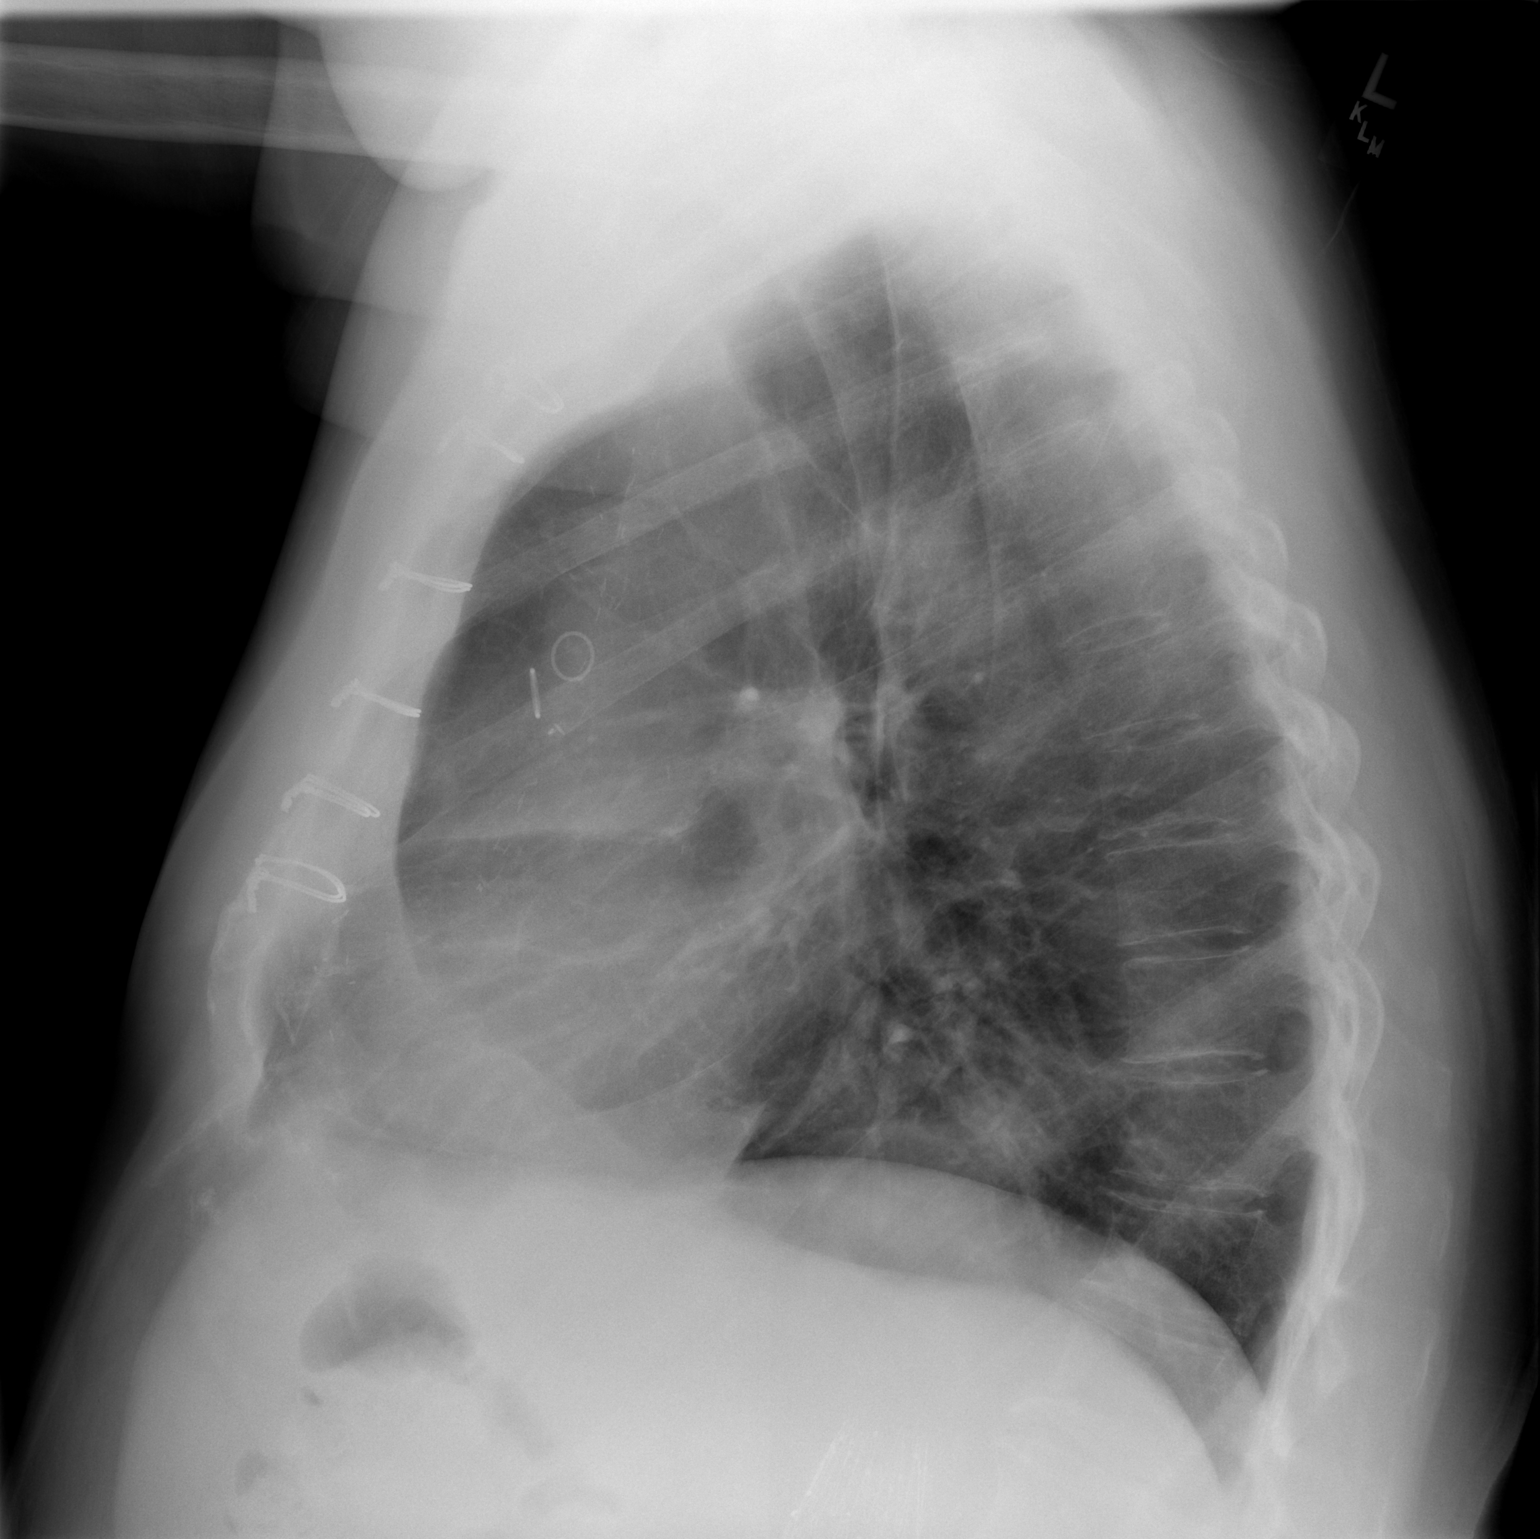

[2 of 2 positions shown; findings below may reference images not displayed]

FINDINGS: The lungs are clear.  No effusion is seen.  The heart is
within normal limits in size.  Moderate sized hiatal hernia is
again noted.  Median sternotomy sutures are present from prior
CABG.
IMPRESSION: No active lung disease.  Hiatal hernia.

## 2010-02-01 ENCOUNTER — Telehealth (INDEPENDENT_AMBULATORY_CARE_PROVIDER_SITE_OTHER): Payer: Self-pay | Admitting: *Deleted

## 2010-02-02 ENCOUNTER — Telehealth: Payer: Self-pay | Admitting: Pulmonary Disease

## 2010-02-03 ENCOUNTER — Telehealth (INDEPENDENT_AMBULATORY_CARE_PROVIDER_SITE_OTHER): Payer: Self-pay | Admitting: *Deleted

## 2010-02-09 ENCOUNTER — Telehealth: Payer: Self-pay | Admitting: Pulmonary Disease

## 2010-02-10 ENCOUNTER — Encounter: Payer: Self-pay | Admitting: Pulmonary Disease

## 2010-02-12 ENCOUNTER — Telehealth (INDEPENDENT_AMBULATORY_CARE_PROVIDER_SITE_OTHER): Payer: Self-pay | Admitting: *Deleted

## 2010-03-01 ENCOUNTER — Ambulatory Visit: Payer: Self-pay | Admitting: Surgery

## 2010-03-10 ENCOUNTER — Telehealth: Payer: Self-pay | Admitting: Pulmonary Disease

## 2010-06-16 IMAGING — CR DG HIP W/ PELVIS BILAT
5 series · 5 of 5 positions shown · non-contrast
Comparison: None

CLINICAL DATA: Pain in hips.

BILATERAL HIP WITH PELVIS - 4+ VIEW

[t pelvis a.p.]
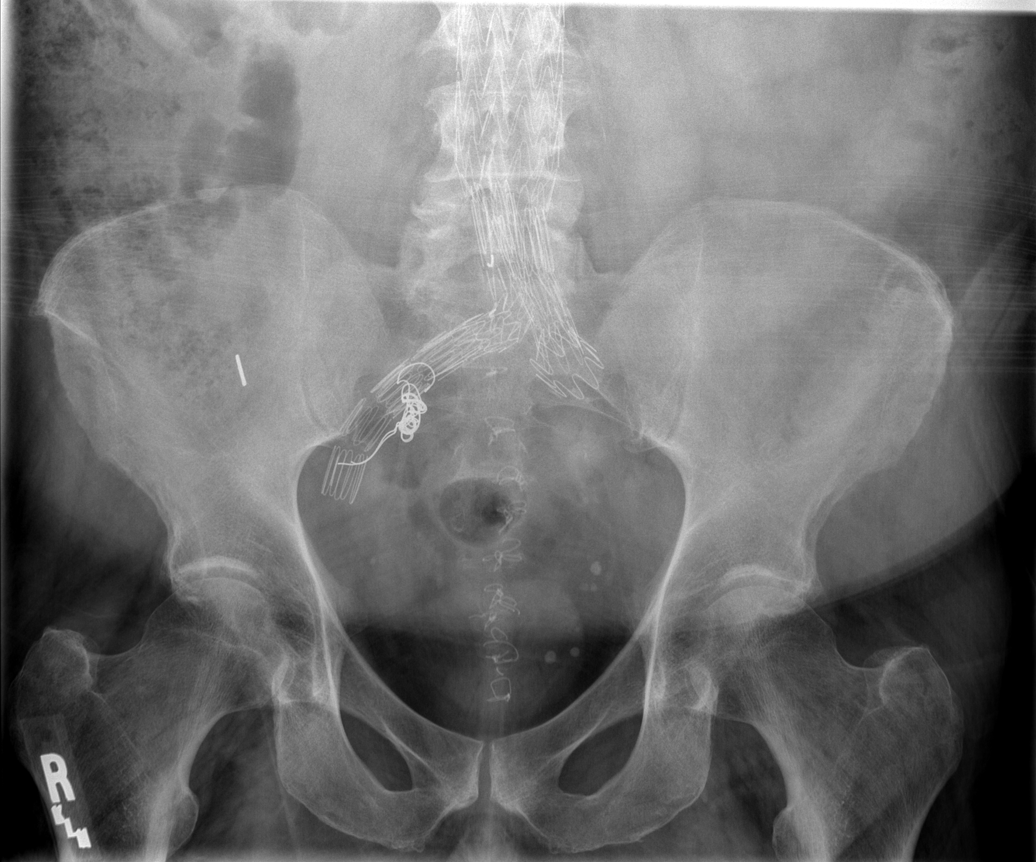

[t hip ap right]
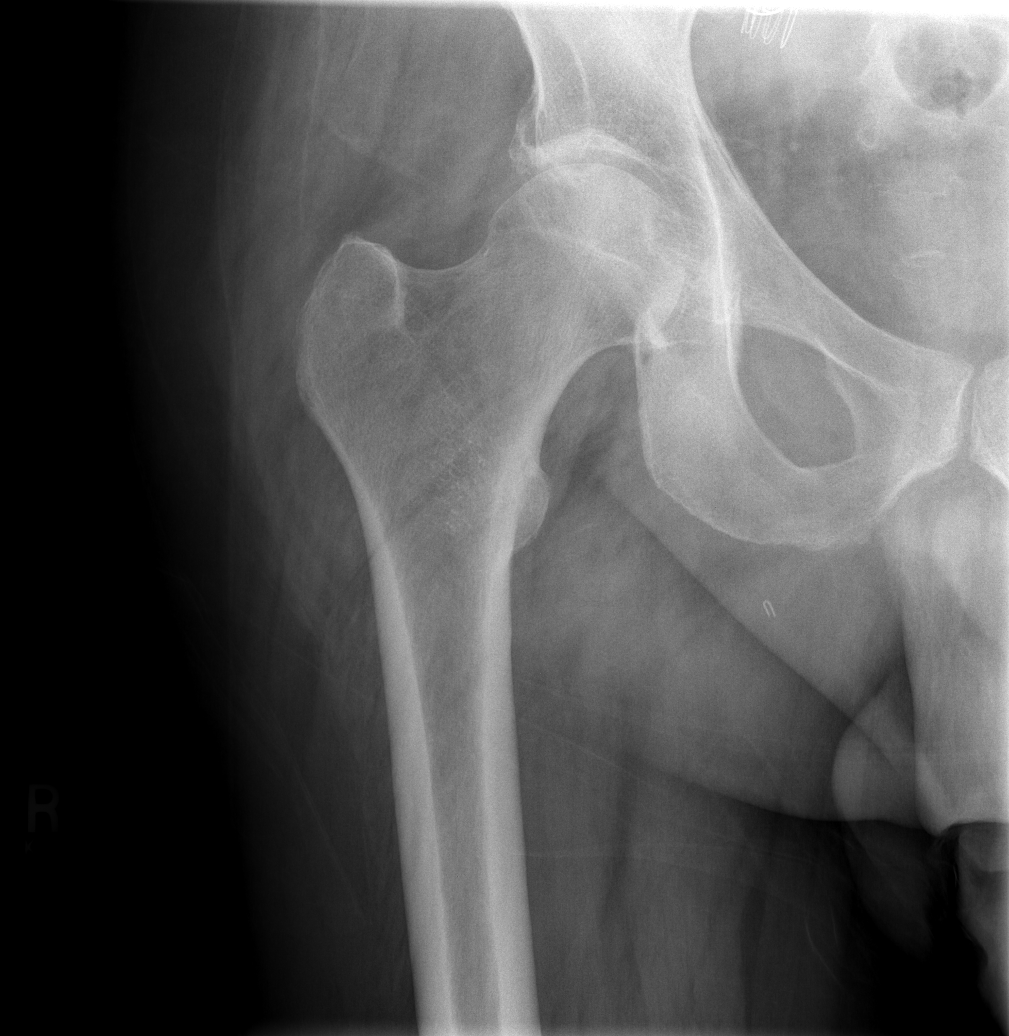

[t hip ap left]
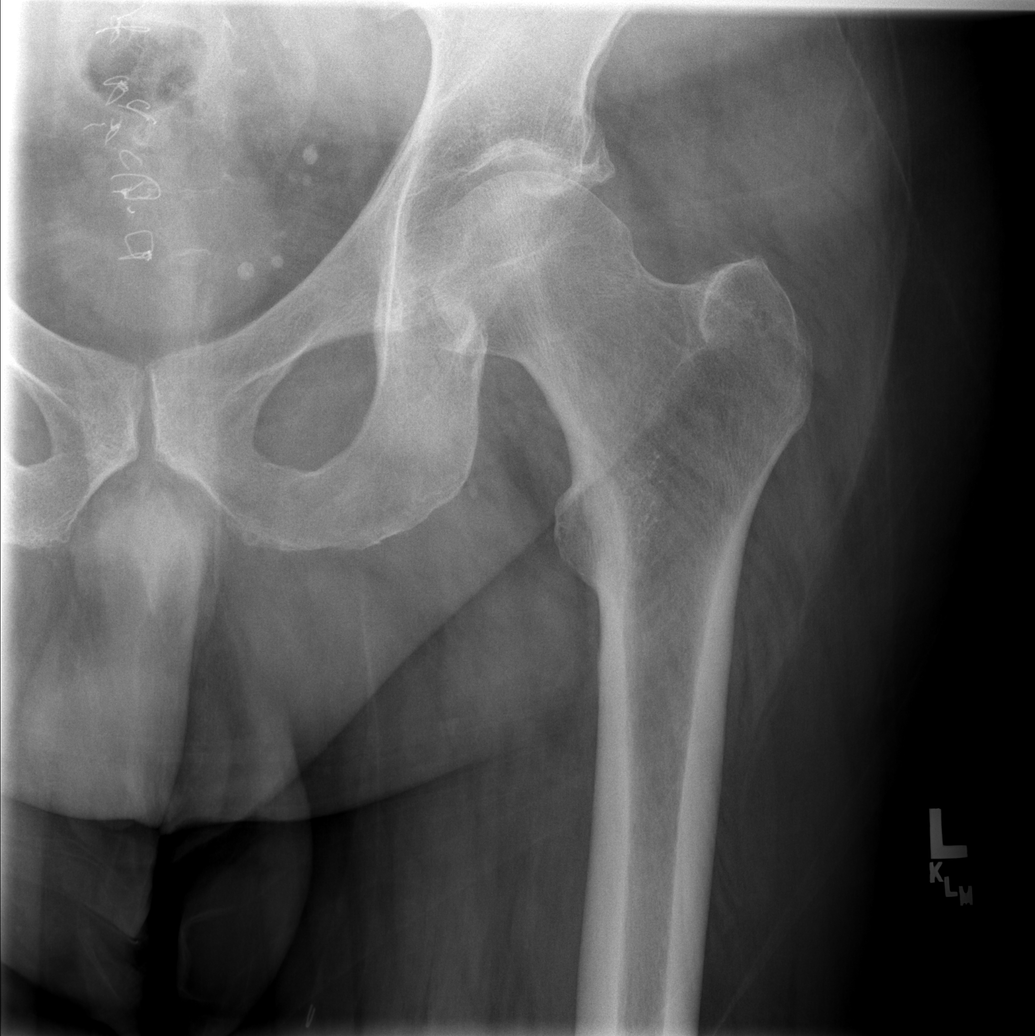

[t hip frog leg left]
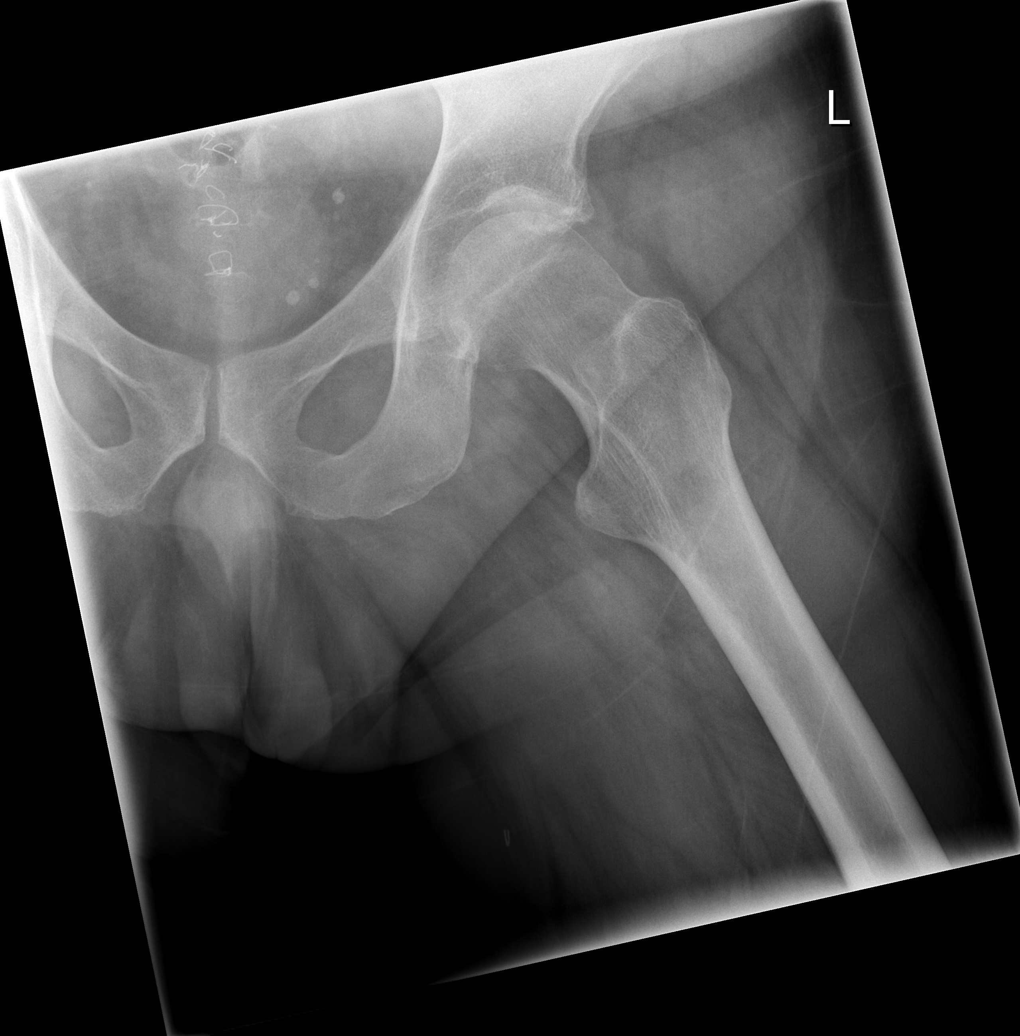

[t hip frog leg right]
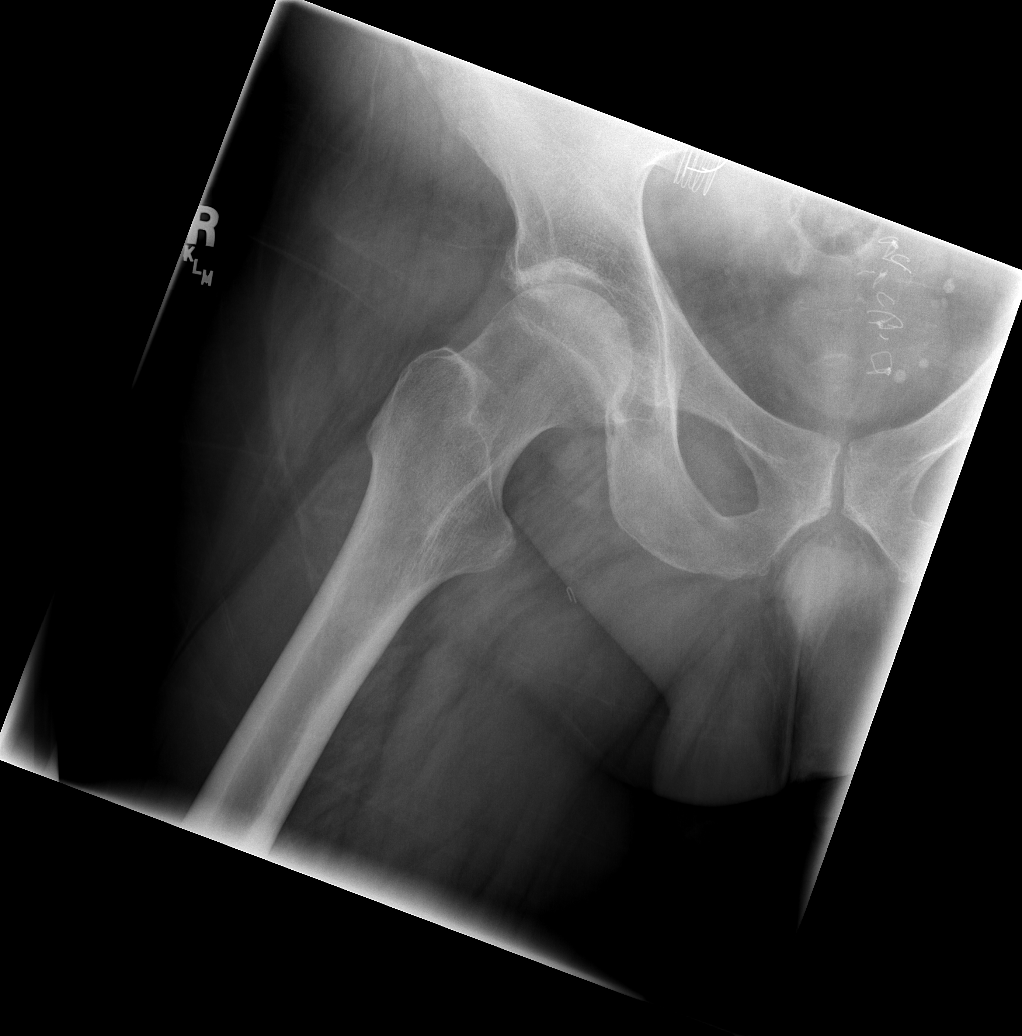

[5 of 5 positions shown; findings below may reference images not displayed]

FINDINGS: Aortobi-iliac stent graft is noted.  No joint space
narrowing.  No lytic lesions.  No para-articular erosions.  SI
joints unremarkable.
IMPRESSION: Unremarkable radiographs of the hips.

## 2010-06-16 IMAGING — CR DG CHEST 2V
2 series · 2 of 2 positions shown · non-contrast
Comparison: 06/29/2009

CLINICAL DATA: Prior CABG.  Pain and hips.  Chest pain and short of
breath.  Cough.

CHEST - 2 VIEW

[w chest pa]
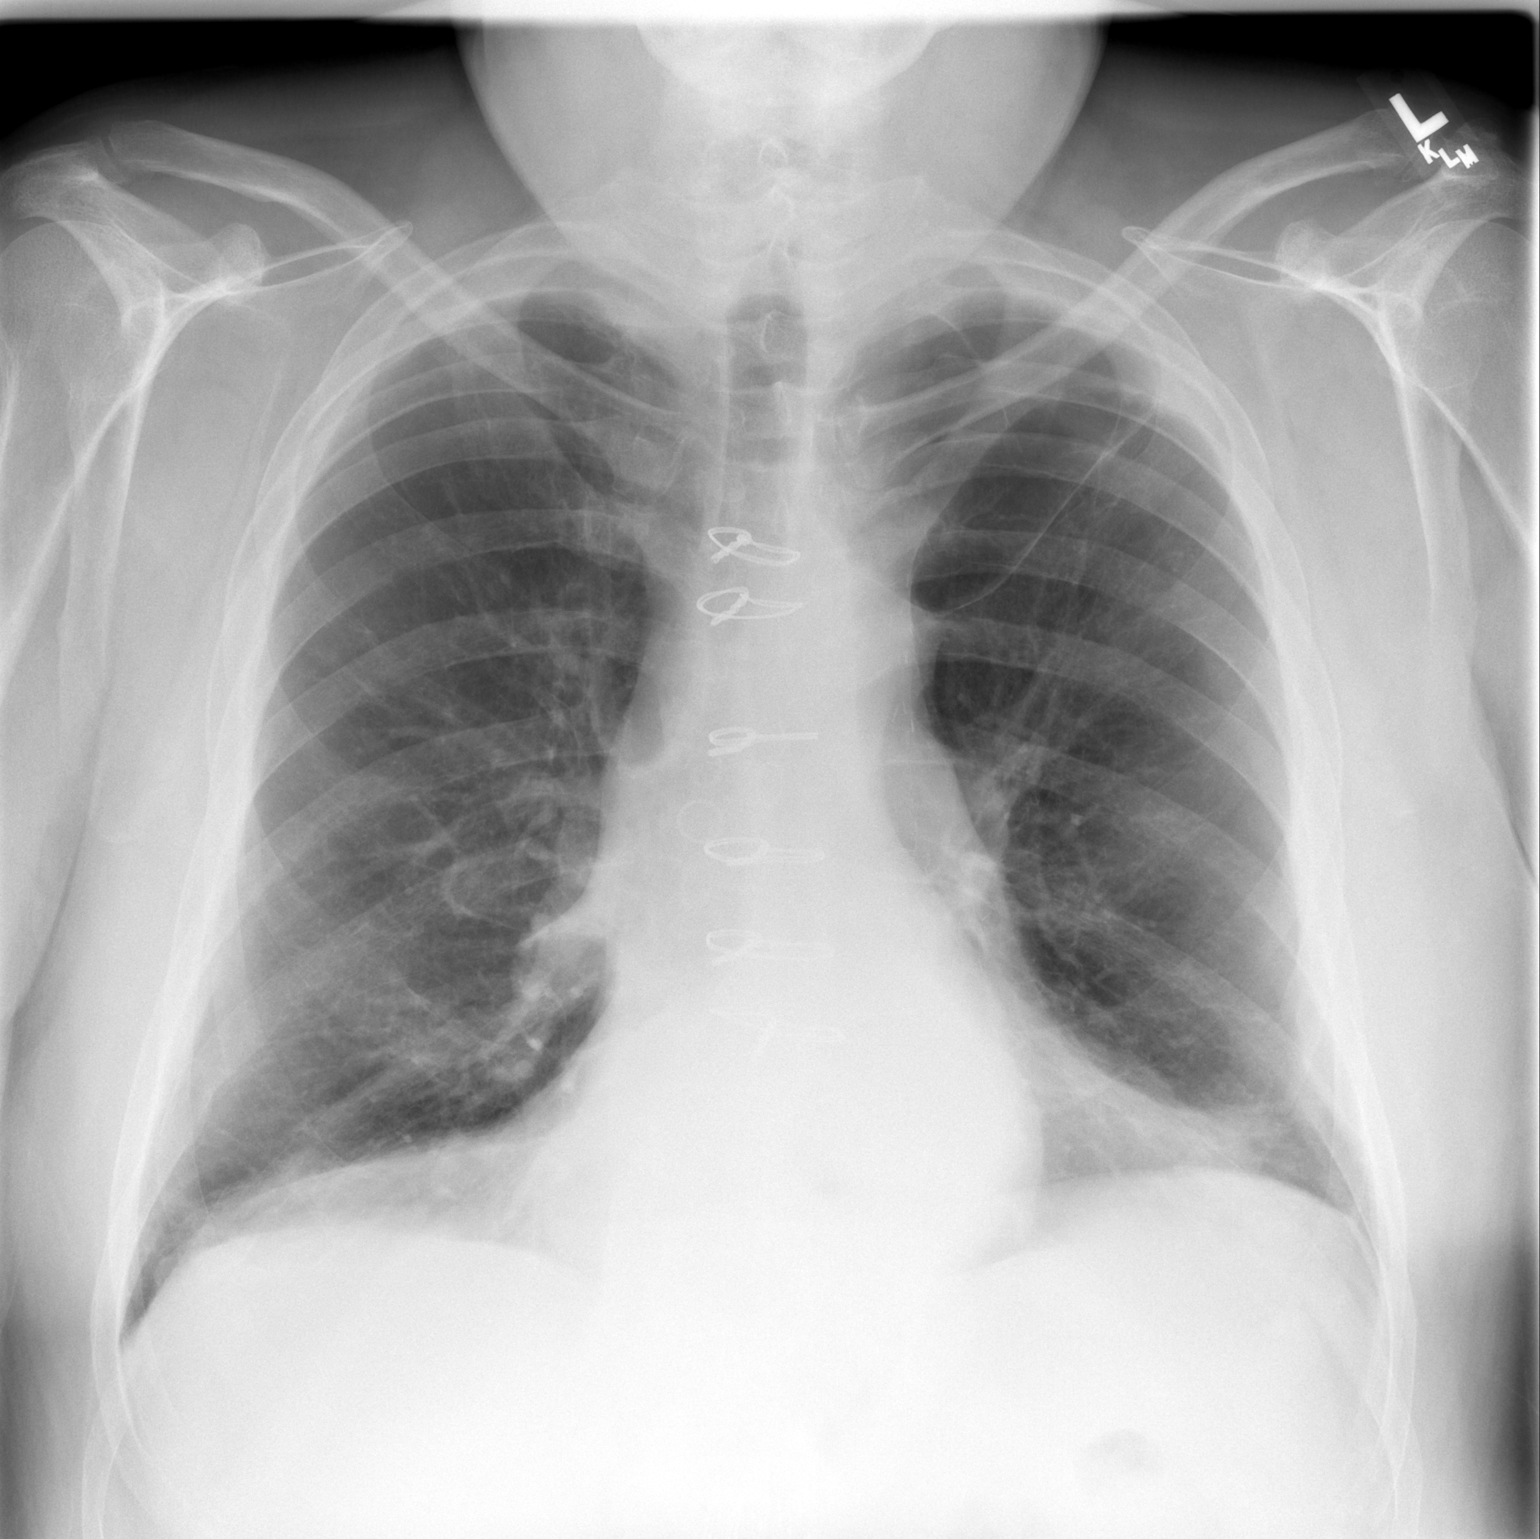

[w chest lat]
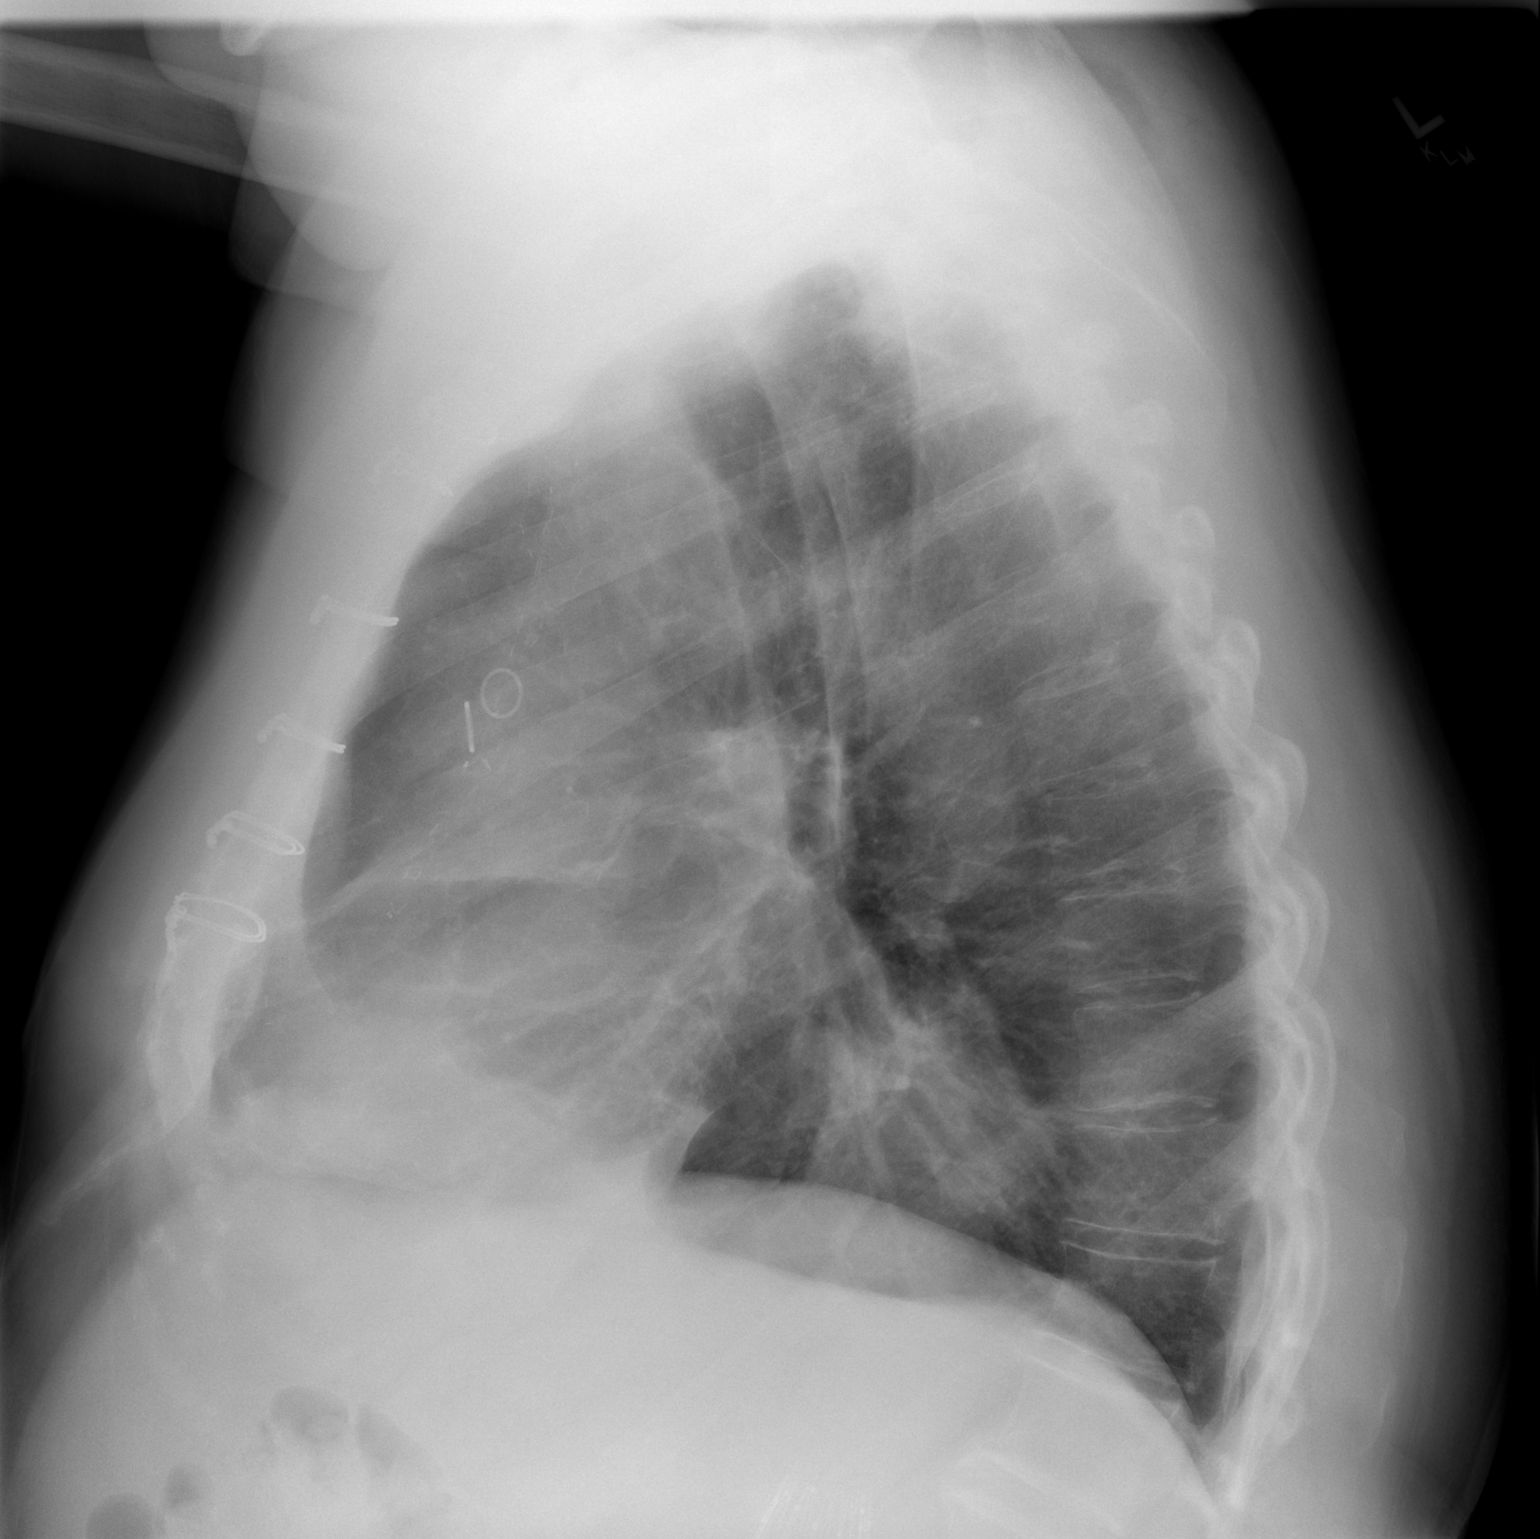

[2 of 2 positions shown; findings below may reference images not displayed]

FINDINGS: Large hiatal hernia.  Mild scarring at the left lung
base.  Sternal wire sutures and mediastinal clips (CABG).  No
vascular congestion.  Large bulla at the left apex. Mild
hyperaeration of the lungs.
IMPRESSION: Large hiatal hernia.  Bullous emphysema.  No acute findings.  No
interval change.

## 2010-07-26 IMAGING — CT CT ANGIO PELVIS
2 of 5 series · 16 of 46 positions shown, 18 images · IV contrast (omnipaque)
Comparison: 12/17/2008

CTA CHEST

CLINICAL DATA: Chest, abdominal, and pelvic pain.  Left flank
pain. History of abdominal aortic aneurysm repair.

CT ANGIOGRAPHY CHEST, ABDOMEN AND PELVIS
TECHNIQUE: Multidetector CT imaging through the chest, abdomen and
pelvis was performed using the standard protocol during bolus
administration of intravenous contrast.  Multiplanar reconstructed
images including MIPs were obtained and reviewed to evaluate the
vascular anatomy.
Contrast: 100 ml intravenous Omnipaque 350

[Series 8: dissection 2.0 st · axial · 0.85mm/px · z∈[-676,-72]mm · 13 of 340 slices shown, 15 images]
[im 19/340  soft-tissue]
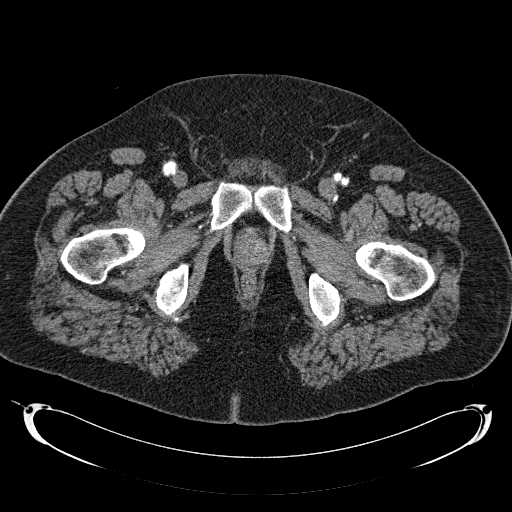
[im 19/340  bone]
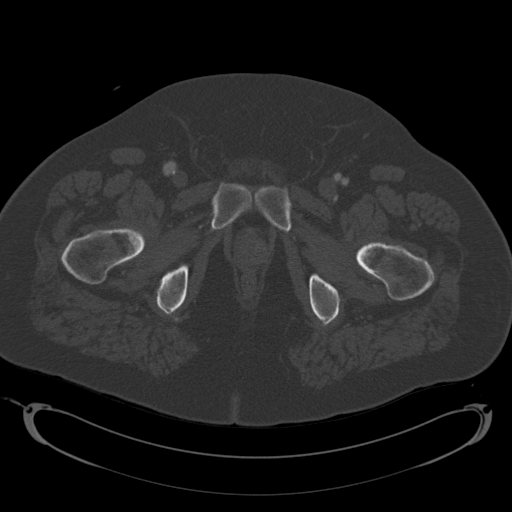
[im 38/340  soft-tissue]
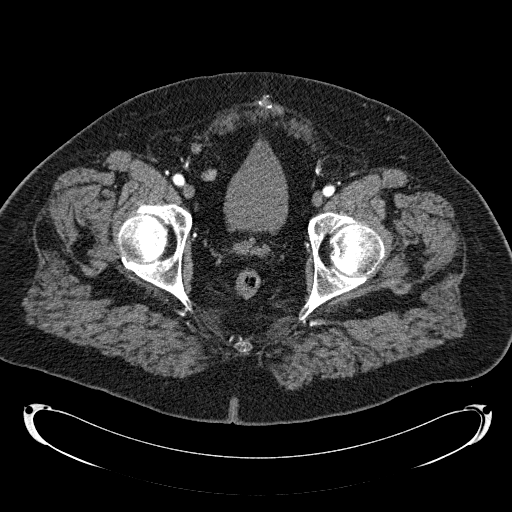
[im 76/340  soft-tissue]
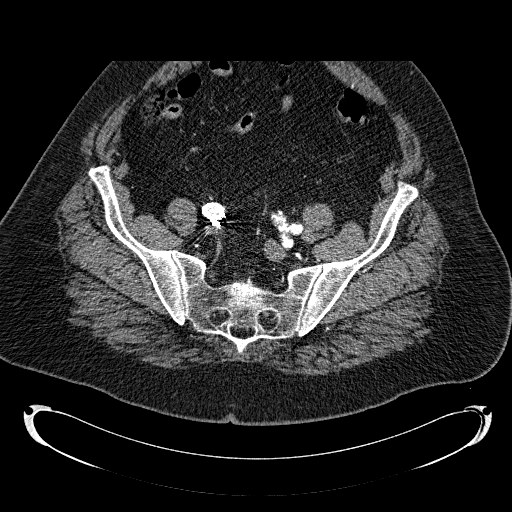
[im 95/340  soft-tissue]
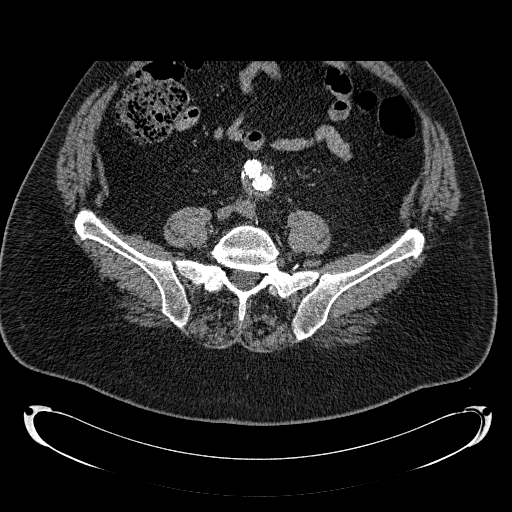
[im 114/340  soft-tissue]
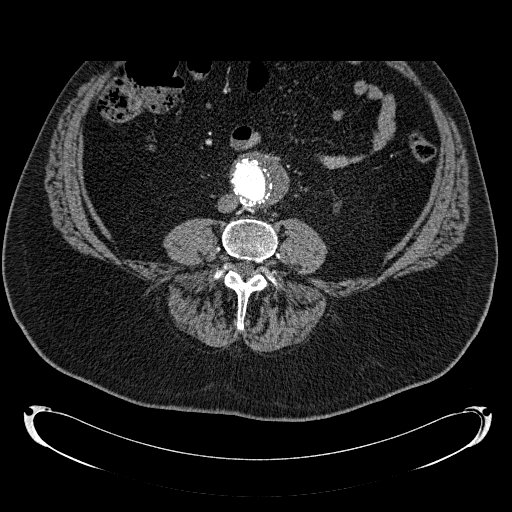
[im 151/340  soft-tissue]
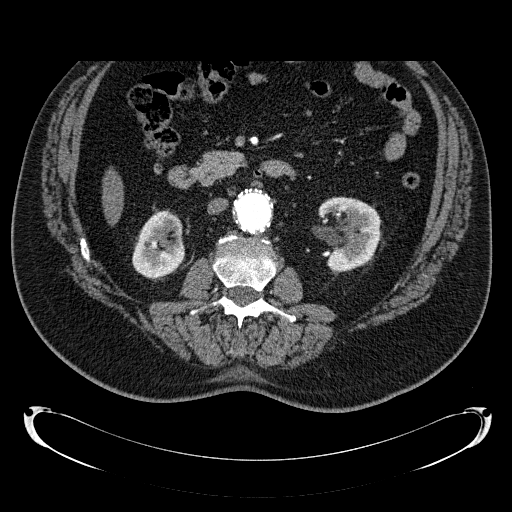
[im 170/340  soft-tissue]
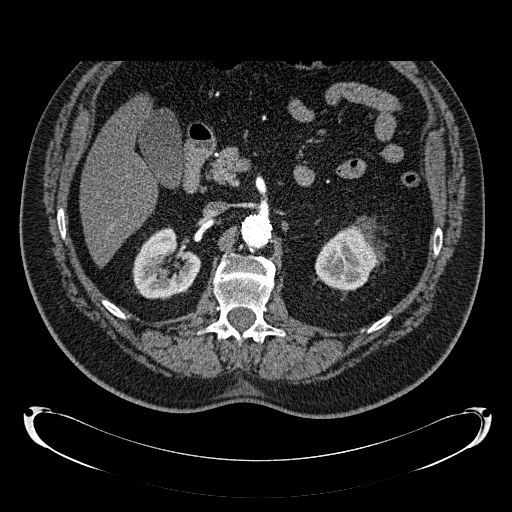
[im 189/340  soft-tissue]
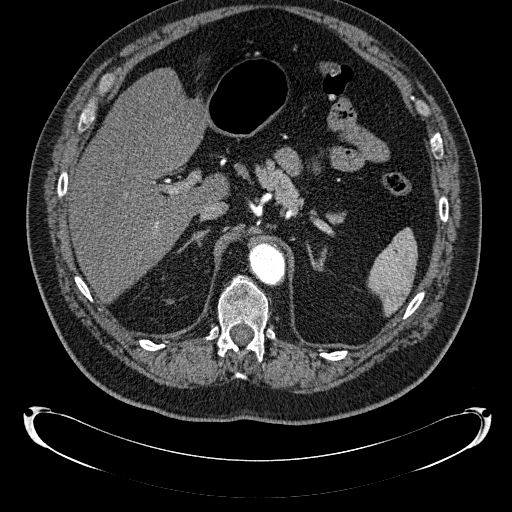
[im 227/340  soft-tissue]
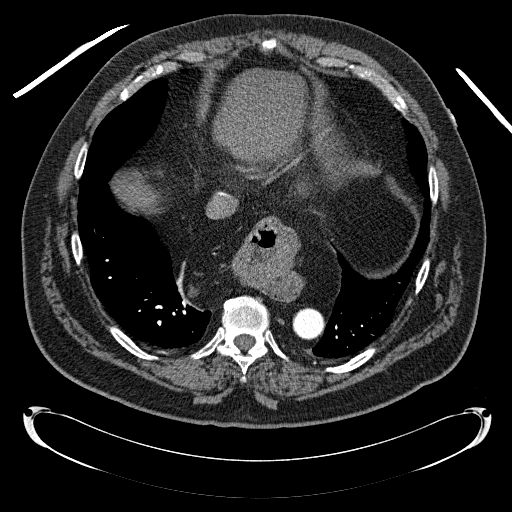
[im 227/340  bone]
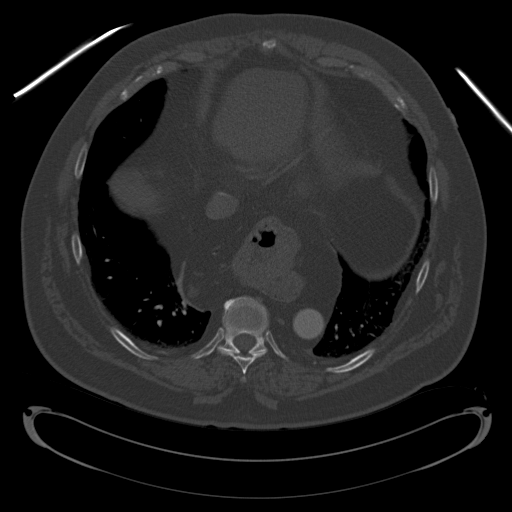
[im 245/340  soft-tissue]
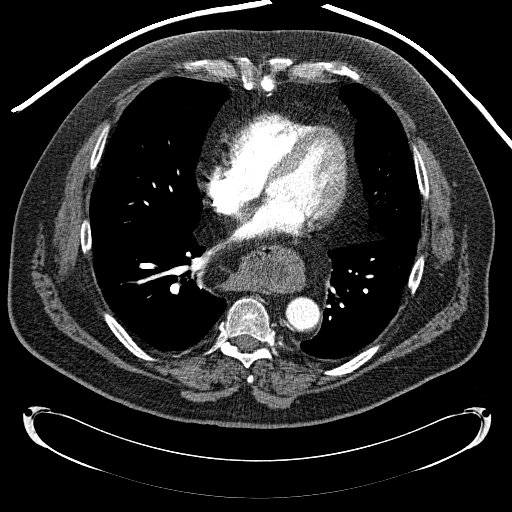
[im 264/340  soft-tissue]
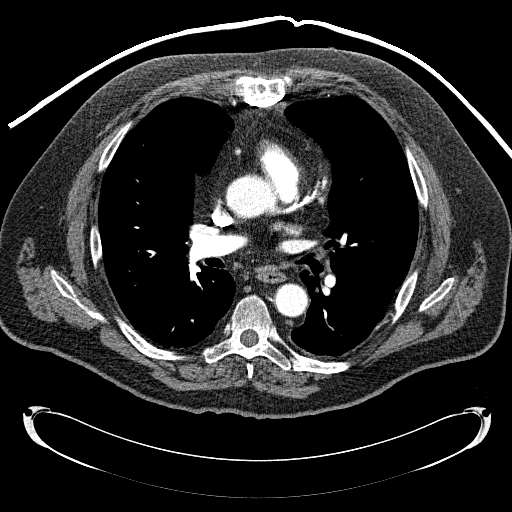
[im 302/340  soft-tissue]
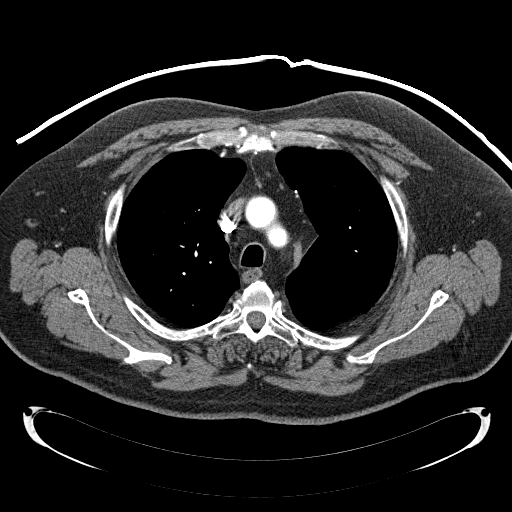
[im 321/340  soft-tissue]
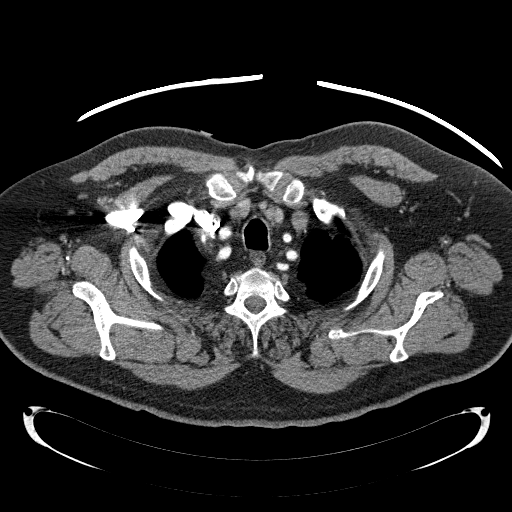

[Series 602: cor c/a/p · coronal · 1.33mm/px · 3 of 159 slices shown]
[im 53/159  soft-tissue]
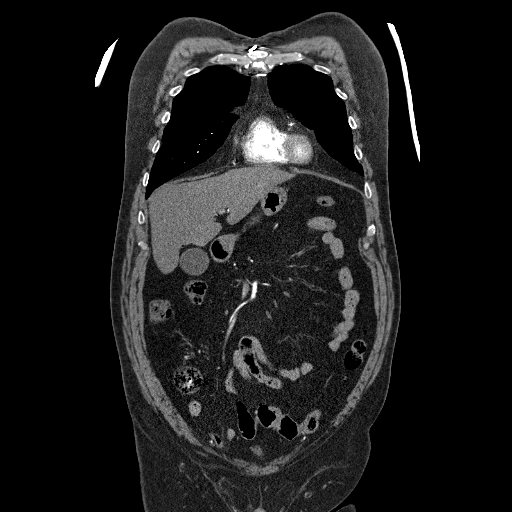
[im 71/159  soft-tissue]
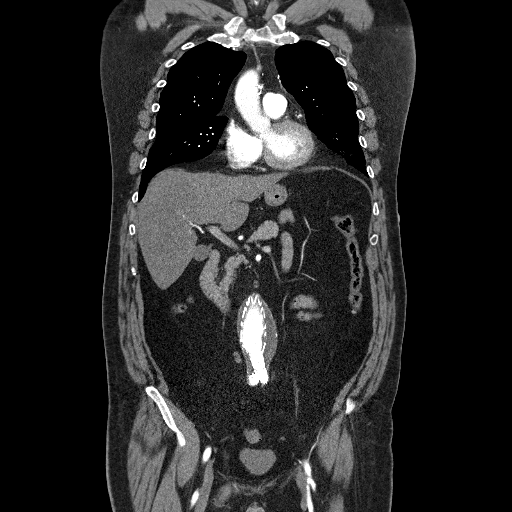
[im 88/159  soft-tissue]
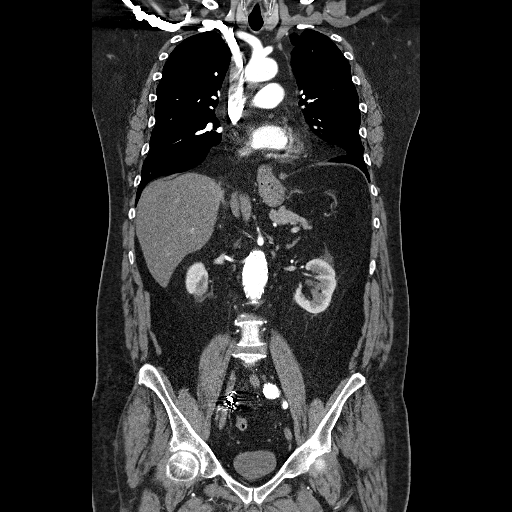

[16 of 46 positions shown; findings below may reference images not displayed]

FINDINGS: Mild cardiomegaly, coronary artery disease, and previous
cardiac surgery again noted.
There is no evidence of thoracic aortic aneurysm or dissection.
No pulmonary emboli are identified.
There are no pleural or pericardial effusions or enlarged lymph
nodes.

Emphysema, mild central peribronchial thickening, and large left
upper lobe bulla are again identified.
There is no evidence of focal airspace disease, consolidation, or
pulmonary mass.
No endobronchial or endotracheal lesions are identified.
Mild dependent/basilar atelectasis/scarring again noted.

No acute or suspicious bony abnormalities are identified.
A large hiatal hernia is again identified.

 Review of the MIP images confirms the above findings.
IMPRESSION: No evidence of acute abnormality - no evidence of thoracic aortic
aneurysm/dissection or pulmonary emboli.

Stable large hiatal hernia.

Mild cardiomegaly and cardiac surgery.

Emphysema and large left upper lobe bulla.

CTA ABDOMEN and CTA PELVIS
FINDINGS: A patent aortobi-iliac stent graft is again noted.
The native abdominal aortic aneurysm sac is stable measuring 5.3 x
5.4 cm.
There is no evidence of endoleak on this study.

The liver, spleen, pancreas, gallbladder, adrenal glands and left
kidneys are unremarkable.
Decreased attenuation in the right lower renal pole is noted but
was not present on the prior study - question infarct or scarring.

There is no evidence of free fluid, enlarged lymph nodes, or
biliary dilatation.
Colonic diverticulosis is identified without evidence of
diverticulitis.
Small bilateral inguinal hernias are again identified, the right
containing a loop of small bowel without bowel obstruction.

No acute or suspicious bony abnormalities are identified.

Review of the MIP images confirms the above findings.
IMPRESSION: New decreased attenuation in the right lower kidney - question
infarct versus scarring.

Stable aortobi-iliac stent graft - no evidence of endoleak.

Colonic diverticulosis without diverticulitis.

Unchanged small bilateral inguinal hernias with the right
containing a loop of small bowel - no evidence of bowel
obstruction.

## 2010-07-26 IMAGING — CR DG CHEST 2V
1 series · 1 of 1 positions shown · non-contrast
Comparison: 11/12/2009

CLINICAL DATA: Left-sided chest and abdominal pain.

CHEST - 2 VIEW

[w chest lat]
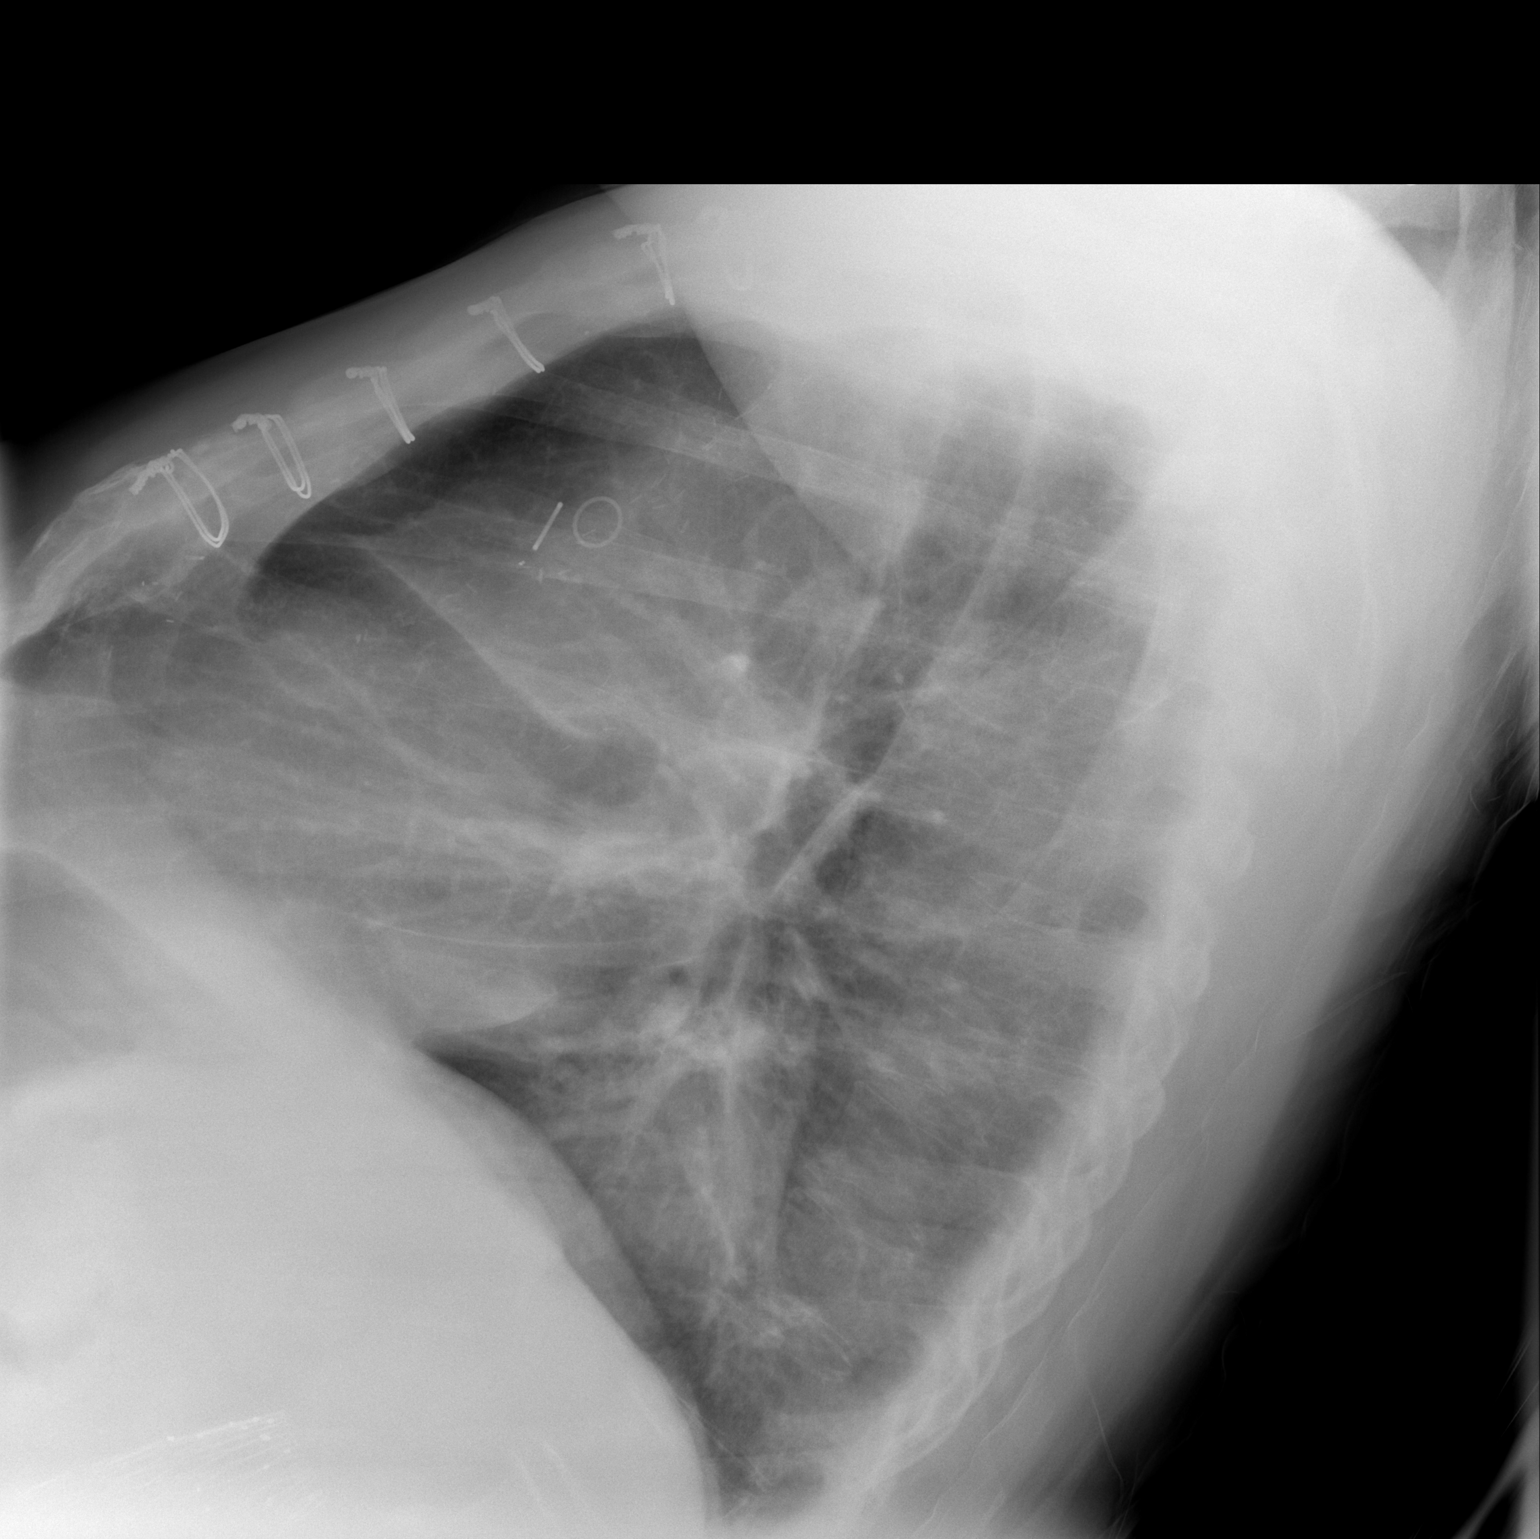

[1 of 1 positions shown; findings below may reference images not displayed]

FINDINGS: Stable COPD with large the level of the left upper lung.
No evidence of edema, infiltrate or pneumothorax.  Stable hiatal
hernia.  The bony thorax shows diffuse osteopenia.
IMPRESSION: Stable COPD and hiatal hernia.  No active disease.

## 2010-08-31 IMAGING — CR DG CHEST 2V
2 series · 2 of 2 positions shown · non-contrast
Comparison: 01/01/2010

CLINICAL DATA: Shortness of breath with exertion.

CHEST - 2 VIEW

[w chest pa]
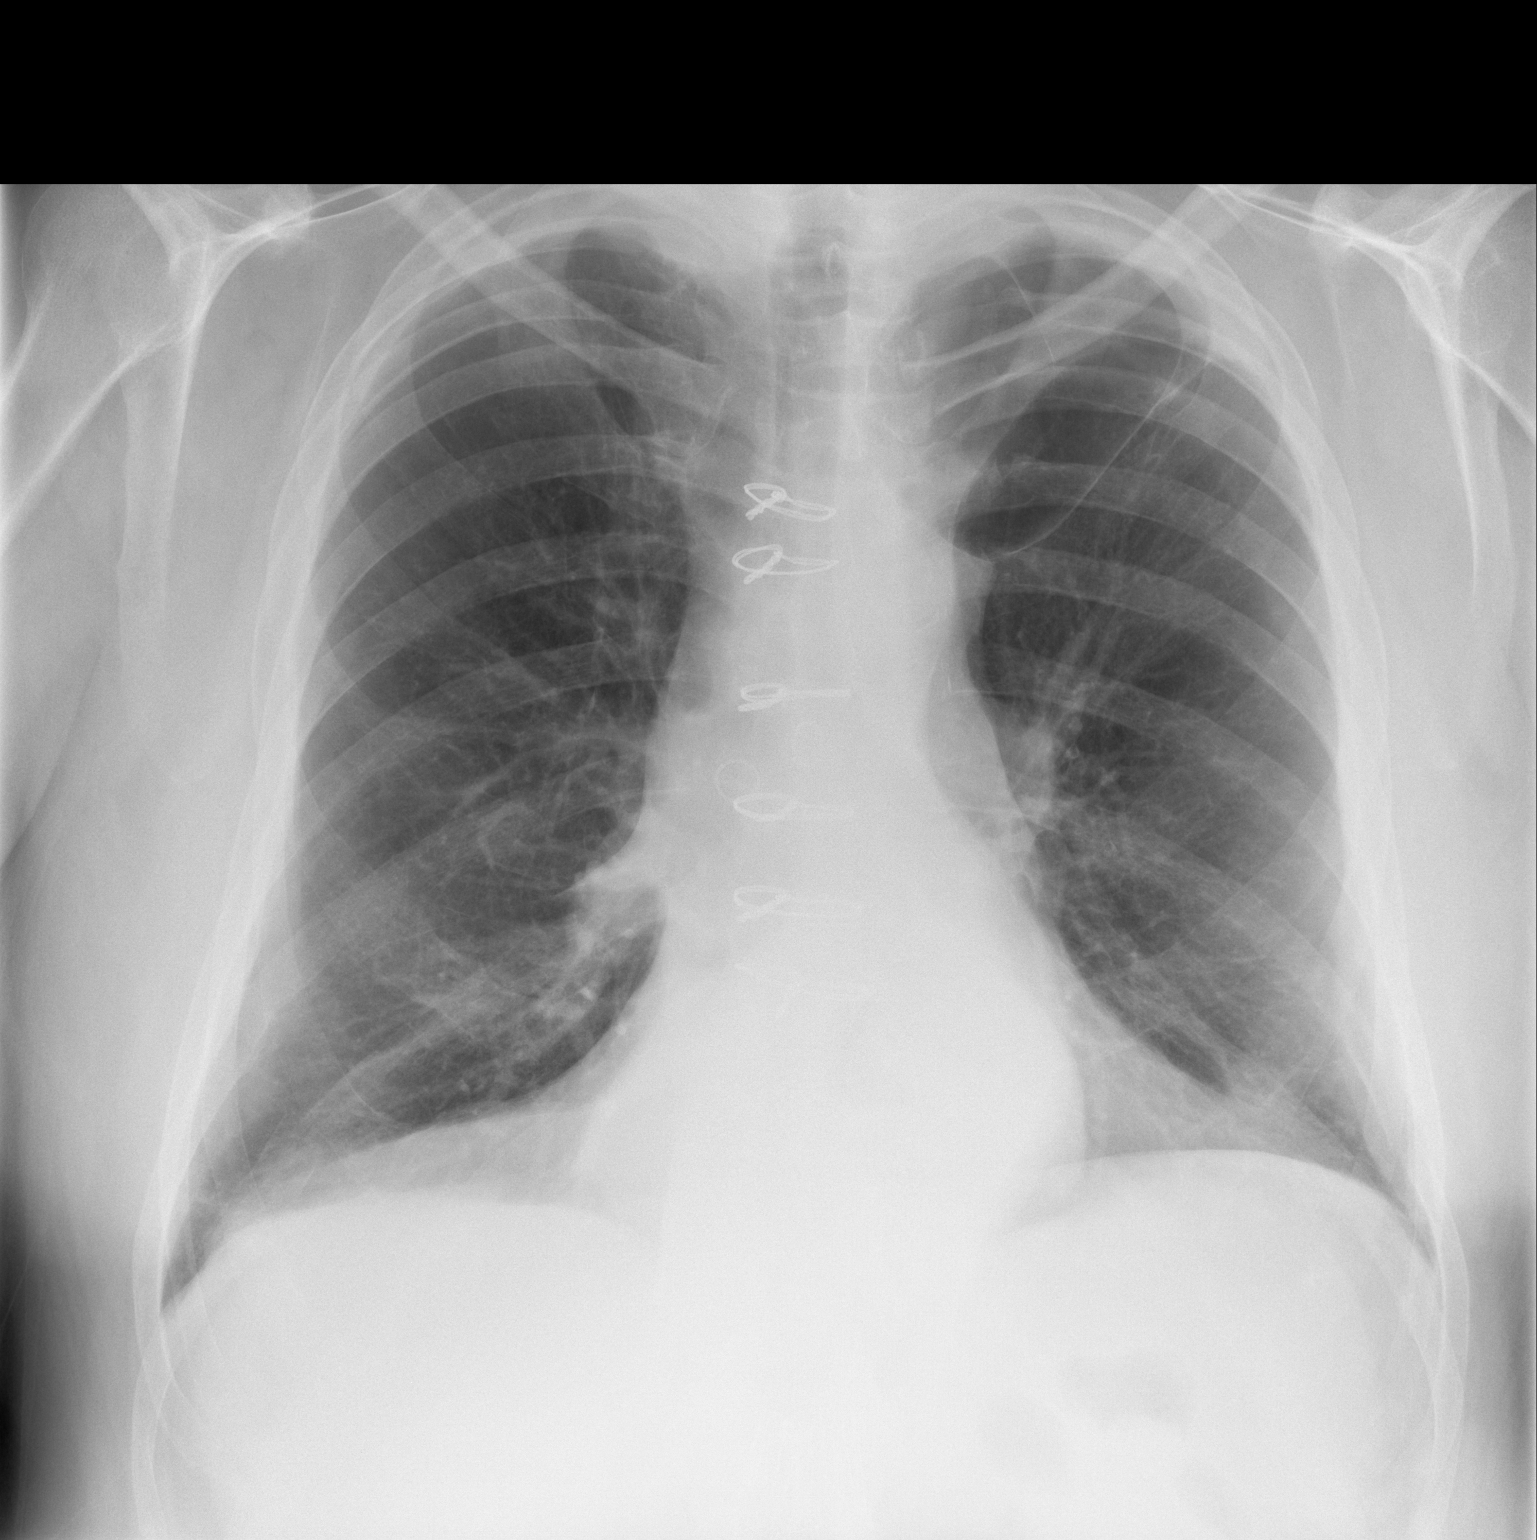

[w chest lat]
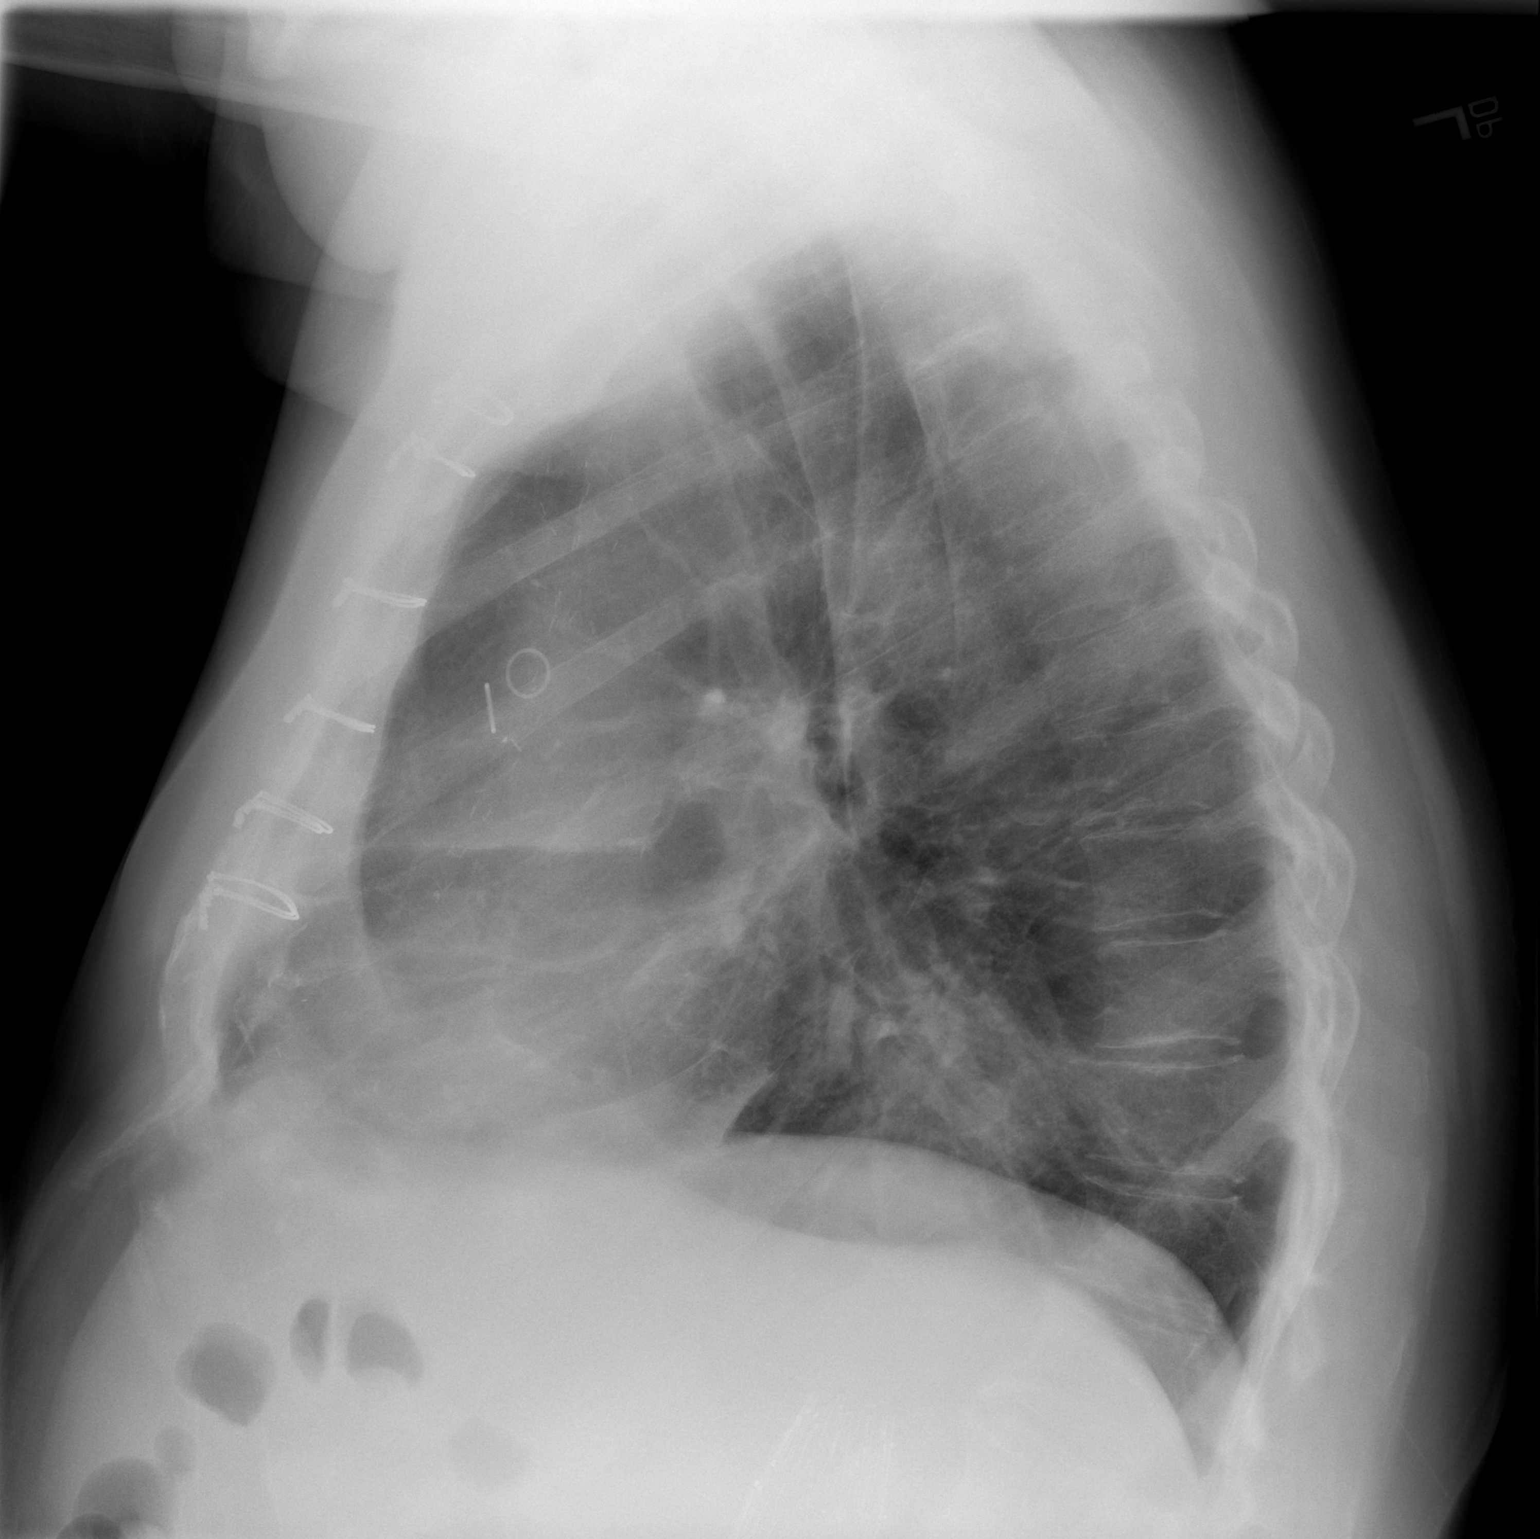

[2 of 2 positions shown; findings below may reference images not displayed]

FINDINGS: Prior CABG.  Stable large hiatal hernia. There is
hyperinflation of the lungs compatible with COPD.  Bullous changes
in the left apex, unchanged.  No acute opacities or effusions.  No
acute bony abnormality.
IMPRESSION: Prior CABG.

COPD.

Large hiatal hernia.

No change or acute findings.

## 2010-10-13 ENCOUNTER — Encounter: Payer: Self-pay | Admitting: Family Medicine

## 2010-10-25 ENCOUNTER — Ambulatory Visit
Admission: RE | Admit: 2010-10-25 | Discharge: 2010-10-25 | Payer: Self-pay | Source: Home / Self Care | Attending: Surgery | Admitting: Surgery

## 2010-10-25 ENCOUNTER — Ambulatory Visit: Admit: 2010-10-25 | Payer: Self-pay | Admitting: Surgery

## 2010-11-07 ENCOUNTER — Encounter: Payer: Self-pay | Admitting: Family Medicine

## 2010-11-11 ENCOUNTER — Ambulatory Visit
Admission: RE | Admit: 2010-11-11 | Discharge: 2010-11-11 | Payer: Self-pay | Source: Home / Self Care | Attending: Family Medicine | Admitting: Family Medicine

## 2010-11-11 DIAGNOSIS — E119 Type 2 diabetes mellitus without complications: Secondary | ICD-10-CM | POA: Insufficient documentation

## 2010-11-11 DIAGNOSIS — H6120 Impacted cerumen, unspecified ear: Secondary | ICD-10-CM | POA: Insufficient documentation

## 2010-11-16 NOTE — Progress Notes (Signed)
Summary: [id number]  Phone Note Call from Patient Call back at Home Phone [phone]   Caller: Spouse-Virginia Wheless Call For: clance Reason for Call: Talk to Nurse, Talk to Doctor Summary of Call: [facility] switched pt from his puffers to Nebulizer, pt not using properly.   He's feeling weak, didn't get 2 pts of blood they were suppose to give him before discharge.  Wife feels he needs this, wants to know if it could be arranged for him to go and get the blood.  Also wants to know if you will call in his puffers.  He's extremely sob, and due to this becomes increasingly abusive and tells wife he just wants to die. Initial call taken by: Eugene Gavia,  Mar 10, 2010 9:38 AM  Follow-up for Phone Call        [facility]. Carron Curie CMA  Mar 10, 2010 9:57 AM  Wife states that pt wants to go back on inahlers. he felt he did better on them then the nebs and wants to go back on the inhalers. Pt was in the hospital and pt wife states he was supposed to have 2 pints of blood before he was discharged from the hospital and this never happened and wife stil lthinks this needs to be done and was wanting to know how to go about this.  States his hgb was 8 on admission and was never told what it was on discharge. Sh efeels this might be contributing to his SOB. Please advise.Carron Curie CMA  Mar 11, 2010 12:18 PM  [facility]  Additional Follow-up for Phone Call Additional follow up Details #1::        the pt MUST contact his primary md to discuss this.  His anemia may be contributing to his shortness of breath, especially with his bad heart disease. I will be happy to put back on spiriva and symbicort call in one month, 4 fills.  I will send an order to [facility] to stop nebs. Additional Follow-up by: Barbaraann Share MD,  Mar 11, 2010 5:34 PM    Additional Follow-up for Phone Call Additional follow up Details #2::    Advised pt's wife of [facility] recs, pt is already schedule this AM to see PMD to  evaluate anemia. Rx's sent to pharmacy.   Prescriptions: SYMBICORT 160-4.5 MCG/ACT  AERO (BUDESONIDE-FORMOTEROL FUMARATE) Two puffs twice daily  #1 x 4   Entered by:   Zackery Barefoot CMA   Authorized by:   Barbaraann Share MD   Signed by:   Zackery Barefoot CMA on 03/12/2010   Method used:   [facility]        [facility] [id number]       [address]       [place], [place]  [address]       Ph: [phone] or [phone]       Fax: [phone]   RxID:   [id number] SPIRIVA HANDIHALER 18 MCG  CAPS (TIOTROPIUM BROMIDE MONOHYDRATE) one puff in handihaler daily  #1 x 4   Entered by:   Zackery Barefoot CMA   Authorized by:   Barbaraann Share MD   Signed by:   Zackery Barefoot CMA on 03/12/2010   Method used:   [facility]        [facility] [id number]       [address]       [place], [place]  [address]  Ph: [phone] or [phone]       Fax: [phone]   RxID:   [id number]

## 2010-11-16 NOTE — Progress Notes (Signed)
Summary: prescript for neb meds  Phone Note Call from Patient   Caller: Patient Call For: clance Summary of Call: need prescript for solution for nebulizer [facility] Initial call taken by: Rickard Patience,  February 01, 2010 2:21 PM  Follow-up for Phone Call        Pt says his neb medications are too expensive through the DME company and would [facility] [place]. New RX sent for the pt.  Follow-up by: Michel Bickers CMA,  February 01, 2010 3:56 PM    Prescriptions: IPRATROPIUM BROMIDE 0.02 % SOLN (IPRATROPIUM BROMIDE) 0.5mg  by [facility] 4 times a day  #120 x 6   Entered by:   Michel Bickers CMA   Authorized by:   Barbaraann Share MD   Signed by:   Michel Bickers CMA on 02/01/2010   Method used:   [facility]        [facility] (retail[facility]       [address]       [place], [place]  [address]       Ph: [phone] or [phone]       Fax: [phone]   RxID:   [id number] PULMICORT 0.5 MG/2ML  SUSP (BUDESONIDE (INHALATION)) One respule into nebulizer twice daily.  #60 x 6   Entered by:   Michel Bickers CMA   Authorized by:   Barbaraann Share MD   Signed by:   Michel Bickers CMA on 02/01/2010   Method used:   [facility]        [facility] (retail[facility]       [address]       [place], [place]  [address]       Ph: [phone] or [phone]       Fax: [phone]   RxID:   [id number] ALBUTEROL SULFATE (2.5 MG/3ML) 0.083%  NEBU (ALBUTEROL SULFATE) Four times daily and 2 additional treatments if needed.  #180 x 6   Entered by:   Michel Bickers CMA   Authorized by:   Barbaraann Share MD   Signed by:   Michel Bickers CMA on 02/01/2010   Method used:   [facility]        [facility] (retail)       [address]       [place], [place]  [address]       Ph: [phone] or [phone]       Fax: [phone]   RxID:   [id number]

## 2010-11-16 NOTE — Medication Information (Signed)
Summary: Verbal Order/ [facility]  Verbal Order/ [facility]   Imported By: Lennie Odor 02/23/2010 13:55:16  _____________________________________________________________________  External Attachment:    Type:   Image     Comment:   External Document

## 2010-11-16 NOTE — Progress Notes (Signed)
Summary: rx-LMTCB  Phone Note From Pharmacy Call back at [phone]   Caller: Eunice Blase @ [facility] Call For: [name]  Summary of Call: pt has duplications on some of his Neb meds.  Need to clarify what he is supposed to be using on inhalers and nebs. Initial call taken by: Eugene Gavia,  February 03, 2010 1:23 PM  Follow-up for Phone Call        [facility]. Carron Curie CMA  February 03, 2010 2:03 PM  spoke with debbie and she states pt was still using spiriva and symbicort as well as neb meds. According to last OV note KC states he will change breathing meds to nebs, but does not specifically state to stop spiriva and symbicort. Please advise if pt is to stop inahlers. thanks. Carron Curie CMA  February 03, 2010 4:51 PM   Additional Follow-up for Phone Call Additional follow up Details #1::        In general we advise patients to take inhalers  - symbicort & spiriva daily - since they are longer acting - & use nebs for breakthrough symptoms as needed upto 3-4 times/ day. KC can review his specific case on return Additional Follow-up by: Comer Locket. Vassie Loll MD,  February 03, 2010 5:11 PM    Additional Follow-up for Phone Call Additional follow up Details #2::    [facility] with Debbie from [facility] informing her of RA's response.  Will forward message to [facility] to review once he returns from vacation.  Aundra Millet Reynolds LPN  February 03, 2010 5:15 PM   Additional Follow-up for Phone Call Additional follow up Details #3:: Details for Additional Follow-up Action Taken: Patient Instructions: 1)  will change your breathing medication to albuterol, atrovent, and budesonide by nebulizer. 2)  take albuterol four times a day, and 2 additional times if needed. 3)  take atrovent four times a day, and 2 additional times if needed. 4)  take budesonide 0.5mg  twice a day only. 5)  can use proair when away from home 6)  you must lose weight. 7)  followup with me in 4mos  If you will look at #1 it says  will change his breathing meds to nebs.  Therefore, please tell pt to stop his inhalers and take his nebs as indicated above.     I called [facility] and LM informing Debbie of KC's recs.  I also called pt and informed him of KC's recs to stop the Spiriva and Symbicort.  Pt was very upset that this wasn't clearly documented in his patient instructions sheet.  I apologized for the confusion and reinformed pt to not take Spiriva and Symbicort and just take his nebs as directed on his patient instructions sheet.  pt verbalized understanding.  Aundra Millet Reynolds LPN  February 04, 2010 9:47 AM

## 2010-11-16 NOTE — Progress Notes (Signed)
Summary: clarification re: neb  Phone Note Call from Patient Call back at Home Phone [phone]   Caller: Patient Call For: Arisa Congleton nurse Summary of Call: pt's spouse needs clarification re: use/ mixing solution for neb. says when she left a msg requesting rx's yesterday no one called her back so she wanted to make sure that someone DOES call her back this time.  Initial call taken by: Tivis Ringer, CNA,  February 02, 2010 2:19 PM  Follow-up for Phone Call        spoke with pt spouse and she wanted to know if she was supposed to mix all neb meds together. I advised Albuterol and ipratropium can be mixed togehter, but to do pulmicort seperately. Carron Curie CMA  February 02, 2010 3:18 PM

## 2010-11-16 NOTE — Assessment & Plan Note (Signed)
Summary: rov for severe emphysema, dyspnea   Copy to:  Lynnea Ferrier Primary Provider/Referring Provider:  Tomi Bamberger  CC:  discharged from [place] on 3//22/2011, still sob , prod cough white, and has not been taking spiriva or symbicort for the last week  states it was not helping and is  out of pro-air .  History of Present Illness: The pt comes in today for f/u of his known severe emphysema.  He was recently in the hospital for melena/GIB, but did not have any worsening pulmonary issues.  He was started on spiriva and symbicort at his initial consultation 08/2009, and was supposed to f/u in 4 weeks.  He did not make this apptm, and tells me that he stopped all his inhaler except proair because "they don't work".  He continues to have significant doe that is interfering with his QOL, but also has underlying LV dysfunction and has gained 40 pounds over the last one year.  He is adamant that something drastic has happened since his bypass surgery, ignoring the fact that severe obstructive disease doesn't develop in a year.  He has had his saturations checked by his primary with exertion, and did not qualify for oxygen.  Current Medications (verified): 1)  Simvastatin 10 Mg Tabs (Simvastatin) .... Take 1 Tablet By Mouth Once A Day 2)  Metoprolol Succinate 25 Mg Xr24h-Tab (Metoprolol Succinate) .... Take 1 Tablet By Mouth Once A Day 3)  Omeprazole 40 Mg Cpdr (Omeprazole) .... Take 1 Tablet By Mouth Two Times A Day 4)  Ambien 10 Mg Tabs (Zolpidem Tartrate) .... Take 1 Tab By Mouth At Bedtime As Needed 5)  Hydrochlorothiazide 25 Mg Tabs (Hydrochlorothiazide) .... Take 1 Tablet By Mouth Once A Day 6)  Spiriva Handihaler 18 Mcg  Caps (Tiotropium Bromide Monohydrate) .... One Puff in Handihaler Daily 7)  Symbicort 160-4.5 Mcg/act  Aero (Budesonide-Formoterol Fumarate) .... Two Puffs Twice Daily 8)  Proair Hfa 108 (90 Base) Mcg/act  Aers (Albuterol Sulfate) .... 2 Puffs Every 4-6 Hours As  Needed  Allergies (verified): 1)  ! Penicillin  Review of Systems       The patient complains of shortness of breath with activity, shortness of breath at rest, productive cough, chest pain, loss of appetite, weight change, headaches, nasal congestion/difficulty breathing through nose, depression, hand/feet swelling, and rash.  The patient denies non-productive cough, coughing up blood, irregular heartbeats, acid heartburn, indigestion, abdominal pain, difficulty swallowing, sore throat, tooth/dental problems, sneezing, itching, ear ache, anxiety, joint stiffness or pain, change in color of mucus, and fever.    Vital Signs:  Patient profile:   70 year old male Height:      74 inches Weight:      265.4 pounds O2 Sat:      97 % on Room air Temp:     97.5 degrees F oral Pulse rate:   114 / minute BP sitting:   130 / 80  (left arm)  Vitals Entered By: Renold Genta RCP, LPN (January 29, 2010 11:58 AM)  O2 Sat at Rest %:  97% O2 Flow:  Room air CC: discharged from [place] on 3//22/2011, still sob ,prod cough white, has not been taking spiriva or symbicort for the last week  states it was not helping and is  out of pro-air  Comments Medications reviewed with patient Renold Genta RCP, LPN  January 29, 2010 12:09 PM    Physical Exam  General:  obese male in nad Lungs:  mild decrease in  breath sounds, no wheezing or rhonchi Heart:  rrr, no mrg Extremities:  mild ankle edema, no cyanosis. Neurologic:  alert and oriented, moves all 4.   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  I find this difficult to fathom.  He is very upset about his doe, and states "my breathing was completely normal after my bypass surgery".  Given the severity of his disease, I find this unlikely, and I also brought to the pt's attention he has gained 40 pounds since  that time.  I am willing to try him on nebulizer treatments, but also encouraged him to consider pulmonary rehab.  I have explained to him we are doing the best we can from a pulmonary standpoint, but he must do his part with weight loss and some type of conditioning program (also medication compliance and followups).  It is also unclear how much his known LV dysfunction is playing in all of this.  He has had his sats checked at rest and with exertion, and does not qualify for oxygen.  Medications Added to Medication List This Visit: 1)  Albuterol Sulfate (2.5 Mg/48ml) 0.083% Nebu (Albuterol sulfate) .... Four times daily and 2 additional treatments if needed. 2)  Pulmicort 0.5 Mg/4ml Susp (Budesonide (inhalation)) .... One respule into nebulizer twice daily. 3)  Ipratropium Bromide 0.02 % Soln (Ipratropium bromide) .... 0.5mg  by hhn 4 times a day  Other Orders: Est. Patient Level IV (06301) DME Referral (DME)  Patient Instructions: 1)  will change your breathing medication to albuterol, atrovent, and budesonide by nebulizer. 2)  take albuterol four times a day, and 2 additional times if needed. 3)  take atrovent four times a day, and 2 additional times if needed. 4)  take budesonide 0.5mg  twice a day only. 5)  can use proair when away from home 6)  you must lose weight. 7)  followup with me in 4mos   Prescriptions: PROAIR HFA 108 (90 BASE) MCG/ACT  AERS (ALBUTEROL SULFATE) 2 puffs every 4-6 hours as needed  #1 x 6   Entered and Authorized by:   Barbaraann Share MD   Signed by:   Barbaraann Share MD on 01/29/2010   Method used:   [facility]        [facility] [id number]       [address]       [place], [place]  [address]       Ph: [phone] or [phone]       Fax: [phone]   RxID:   [id number] IPRATROPIUM BROMIDE 0.02 % SOLN (IPRATROPIUM BROMIDE) 0.5mg  by hhn 4 times a day  #120 x 6   Entered and Authorized by:   Barbaraann Share MD   Signed by:    Barbaraann Share MD on 01/29/2010   Method used:   Print then Give to Patient   RxID:   [id number] PULMICORT 0.5 MG/2ML  SUSP (BUDESONIDE (INHALATION)) One respule into nebulizer twice daily.  #60 x 6   Entered and Authorized by:   Barbaraann Share MD   Signed by:   Barbaraann Share MD on 01/29/2010   Method used:   Print then Give to Patient   RxID:   [id number] ALBUTEROL SULFATE (2.5 MG/3ML) 0.083%  NEBU (ALBUTEROL SULFATE) Four times daily and 2 additional treatments if needed.  #180  x 6   Entered and Authorized by:   Barbaraann Share MD   Signed by:   Barbaraann Share MD on 01/29/2010   Method used:   Print then Give to Patient   RxID:   [id number]

## 2010-11-16 NOTE — Progress Notes (Signed)
Summary: fax req/ ov notes  Phone Note From Other Clinic   Caller: angie at dr [facility] office Call For: clance Summary of Call: requests ov notes from 01/29/10. fax # [phone]. office # [phone] Initial call taken by: Tivis Ringer, CNA,  February 12, 2010 11:00 AM  Follow-up for Phone Call        Faxed notes/Juanita Follow-up by: Darletta Moll,  Feb 15, 2010 3:45 PM

## 2010-11-16 NOTE — Progress Notes (Signed)
Summary: neb paperwork  Phone Note Other Incoming   Caller: Debbie with [facility] Summary of Call: Eunice Blase calling to check on order form that was faxed for pt neb medicine. please advise if paperwork is complete. thanks.  Initial call taken by: Carron Curie CMA,  February 09, 2010 2:49 PM  Follow-up for Phone Call        received paperwork from [facility] and put in Kc's very important look at folder for him to review and sign.  Aundra Millet Reynolds LPN  February 10, 2010 9:40 AM   Additional Follow-up for Phone Call Additional follow up Details #1::        noted. Additional Follow-up by: Barbaraann Share MD,  February 10, 2010 10:11 AM     Appended Document: neb paperwork paperwork signed and faxed back to [facility]

## 2010-11-18 NOTE — Assessment & Plan Note (Signed)
Summary: NEW PT TO EST/CLE   Vital Signs:  Patient profile:   70 year old male Height:      74 inches Weight:      258 pounds BMI:     33.24 Temp:     98.7 degrees F oral Pulse rate:   87 / minute Pulse rhythm:   regular BP sitting:   126 / 84  (right arm) Cuff size:   regular  Vitals Entered By: Linde Gillis CMA Duncan Dull) (November 11, 2010 1:49 PM) CC: new patient, establish care   History of Present Illness: 70 yo here to establish care.  Former pt of Dr. Raquel James (practice closed).  1.  Emphysema- followed by Dr. Shelle Iron.  Admits to being non compliant.  No longer taking his Symbicort, Spiriva, or pulmicort.  Only taking Proair as needed.  2.  DM- per pt, Dr. Alanda Amass recently diagnosed him with DM.  Started Metformin 500 mg two times a day three weeks ago (awaiting these labs).  Denies any episodes of hypoglycemia.  3.  HTN- has been well controlled on HCTZ 25 mg daily, Metoprolol 25 mg daily.   4. PVD with stents- H/o AAA, followed by Vascular.  5.  Chronic pain- hips, back and knees.  Has been taking Hydrocodone for years.  Unable to ambulate for long periods of time due to severe COPD, PVD and chronic pain.  6.  Cerumen impaction- using OTC debrox drops but feels both of his ears are "stopped up."  No ear pain, discharge.   Current Medications (verified): 1)  Simvastatin 10 Mg Tabs (Simvastatin) .... Take 1 Tablet By Mouth Once A Day 2)  Metoprolol Succinate 25 Mg Xr24h-Tab (Metoprolol Succinate) .... Take 1 Tablet By Mouth Once A Day 3)  Omeprazole 40 Mg Cpdr (Omeprazole) .... Take 1 Tablet By Mouth Two Times A Day 4)  Ambien 10 Mg Tabs (Zolpidem Tartrate) .... Take 1 Tab By Mouth At Bedtime As Needed 5)  Hydrochlorothiazide 25 Mg Tabs (Hydrochlorothiazide) .... Take 1 Tablet By Mouth Once A Day 6)  Spiriva Handihaler 18 Mcg  Caps (Tiotropium Bromide Monohydrate) .... One Puff in Handihaler Daily 7)  Proair Hfa 108 (90 Base) Mcg/act  Aers (Albuterol Sulfate) .... 2  Puffs Every 4-6 Hours As Needed 8)  Aspirin 81 Mg Tabs (Aspirin) .... Take One Tablet By Mouth Daily 9)  Folic Acid 1 Mg Tabs (Folic Acid) .... Take One Tablet By Mouth Two Times A Day 10)  Citalopram Hydrobromide 10 Mg Tabs (Citalopram Hydrobromide) .... Take One Tablet By Mouth Two Times A Day 11)  Ambien 10 Mg Tabs (Zolpidem Tartrate) .... Take One Tablet By Mouth At Bedtime 12)  Hydrocodone-Acetaminophen 5-500 Mg Tabs (Hydrocodone-Acetaminophen) .... Take One Tablet By Mouth Every 4-6 Hours As Needed Pain 13)  Onetouch Basic System W/device Kit (Blood Glucose Monitoring Suppl) .... Use As Directed. 14)  Onetouch Lancets  Misc (Lancets) .... Use As Directed 15)  Metformin Hcl 500 Mg Tabs (Metformin Hcl) .... Take 1 Tab Twice Daily  Allergies: 1)  ! 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  started at age 79.  2 to 4 ppd. quit 3, 2010. pt is married. pt does not have any  children. pt is retired.  prev worked as a Copywriter, advertising.   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 h/o GIB    Family History: emphysema: mother heart disease: mother, maternal grandmother cancer: maternal grandfather (unsure what kind) ALS dad  Review of Systems      See HPI General:  Denies malaise. Eyes:  Denies blurring. ENT:  Denies difficulty swallowing. CV:  Denies chest pain or discomfort. Resp:  Complains of cough, shortness of breath, and wheezing; denies  sputum productive. GI:  Denies abdominal pain and change in bowel habits. GU:  Denies dysuria. MS:  Complains of joint pain and joint swelling. Derm:  Denies rash. Neuro:  Denies headaches. Psych:  Denies anxiety and depression. Endo:  Denies cold intolerance and excessive urination. Heme:  Denies abnormal bruising and bleeding.  Physical Exam  General:  obese male in nad Head:  normocephalic and atraumatic.   Eyes:  vision grossly intact, pupils equal, pupils round, and pupils reactive to light.   Ears:  cerumen impaction bilaterally, removed with saline irrigation and curette Nose:  deviated to right with obstruction Mouth:  clear Neck:  no jvd, tmg, LN Lungs:  mild decrease in breath sounds, no wheezing or rhonchi Heart:  rrr, no mrg Abdomen:  soft and nontender, bs+ Msk:  normal ROM.   Extremities:  mild ankle edema, no cyanosis. Neurologic:  alert and oriented, moves all 4. Skin:  turgor normal and color normal.   Psych:  Cognition and judgment appear intact. Alert and cooperative with normal attention span and concentration. No apparent delusions, illusions, hallucinations   Impression & Recommendations:  Problem # 1:  DM (ICD-250.00) Assessment New awaiting labs from Dr. Alanda Amass. Given rx for glucometer and counselled on blood sugar monitoring and DM friendly diet. His updated medication list for this problem includes:    Aspirin 81 Mg Tabs (Aspirin) .Marland Kitchen... Take one tablet by mouth daily    Metformin Hcl 500 Mg Tabs (Metformin hcl) .Marland Kitchen... 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  Problem # 3:  HYPERTENSION (ICD-401.9) Assessment: Unchanged stable on current meds. His updated medication list for this problem includes:    Metoprolol Succinate 25 Mg Xr24h-tab (Metoprolol succinate) .Marland Kitchen... Take 1 tablet by mouth once a day    Hydrochlorothiazide 25 Mg Tabs (Hydrochlorothiazide) .Marland Kitchen...  Take 1 tablet by mouth once a day  Problem # 4:  CERUMEN IMPACTION, BILATERAL (ICD-380.4) Assessment: New cerumen removed. Orders: Cerumen Impaction Removal (24401)  Complete Medication List: 1)  Simvastatin 10 Mg Tabs (Simvastatin) .... Take 1 tablet by mouth once a day 2)  Metoprolol Succinate 25 Mg Xr24h-tab (Metoprolol succinate) .... Take 1 tablet by mouth once a day 3)  Omeprazole 40 Mg Cpdr (Omeprazole) .... Take 1 tablet by mouth two times a day 4)  Ambien 10 Mg Tabs (Zolpidem tartrate) .... Take 1 tab by mouth at bedtime as needed 5)  Hydrochlorothiazide 25 Mg Tabs (Hydrochlorothiazide) .... Take 1 tablet by mouth once a day 6)  Spiriva Handihaler 18 Mcg Caps (Tiotropium bromide monohydrate) .... One puff in handihaler daily 7)  Proair Hfa 108 (90 Base) Mcg/act Aers (Albuterol sulfate) .... 2 puffs every 4-6 hours as needed 8)  Aspirin 81 Mg Tabs (Aspirin) .... Take one tablet by mouth daily 9)  Folic Acid 1 Mg Tabs (Folic acid) .Marland KitchenMarland KitchenMarland Kitchen  Take one tablet by mouth two times a day 10)  Citalopram Hydrobromide 10 Mg Tabs (Citalopram hydrobromide) .... Take one tablet by mouth two times a day 11)  Ambien 10 Mg Tabs (Zolpidem tartrate) .... Take one tablet by mouth at bedtime 12)  Hydrocodone-acetaminophen 5-500 Mg Tabs (Hydrocodone-acetaminophen) .... Take one tablet by mouth every 4-6 hours as needed pain 13)  Onetouch Basic System W/device Kit (Blood glucose monitoring suppl) .... Use as directed. 14)  Onetouch Lancets Misc (Lancets) .... Use as directed 15)  Metformin Hcl 500 Mg Tabs (Metformin hcl) .... Take 1 tab twice daily  Patient Instructions: 1)  It was very nice to meet you, Mr. Matthews. 2)  Try checking your sugars a few times a week. 3)  As soon as I get your records, we will let you know. Prescriptions: METFORMIN HCL 500 MG TABS (METFORMIN HCL) Take 1 tab twice daily  #180 x 3   Entered and Authorized by:   [name] MD   Signed by:   [name] MD on 11/11/2010    Method used:   Print then Give to Patient   RxID:   [id number] [facility] LANCETS  MISC (LANCETS) use as directed  #100 x 0   Entered and Authorized by:   [name] MD   Signed by:   [name] MD on 11/11/2010   Method used:   Print then Give to Patient   RxID:   [id number] [facility] BASIC SYSTEM W/DEVICE KIT (BLOOD GLUCOSE MONITORING SUPPL) use as directed.  #1 x 0   Entered and Authorized by:   [name] MD   Signed by:   [name] MD on 11/11/2010   Method used:   Print then Give to Patient   RxID:   [id number] AMBIEN 10 MG TABS (ZOLPIDEM TARTRATE) take one tablet by mouth at bedtime  #30 x 3   Entered and Authorized by:   [name] MD   Signed by:   [name] MD on 11/11/2010   Method used:   Print then Give to Patient   RxID:   [id number]    Orders Added: 1)  Cerumen Impaction Removal [69210] 2)  New Patient Level IV [99204]    Current Allergies (reviewed today): ! PENICILLIN

## 2010-11-25 ENCOUNTER — Telehealth: Payer: Self-pay | Admitting: Family Medicine

## 2010-12-02 NOTE — Progress Notes (Signed)
Summary: hydrocodone  Phone Note Refill Request Message from:  Fax from Pharmacy on November 25, 2010 1:09 PM  Refills Requested: Medication #1:  HYDROCODONE-ACETAMINOPHEN 5-500 MG TABS take one tablet by mouth every 4-6 hours as needed pain   Last Refilled: 10/20/2010 [facility]. [phone].  Initial call taken by: Melody Comas,  November 25, 2010 1:09 PM  Follow-up for Phone Call        please call this in and then have the long term arrangements made by Dr. Dayton Martes.  thanks.  Follow-up by: Crawford Givens MD,  November 25, 2010 2:11 PM  Additional Follow-up for Phone Call Additional follow up Details #1::        Rx called to pharmacy. Additional Follow-up by: Linde Gillis CMA Orena Cavazos Dull),  November 25, 2010 2:44 PM    Prescriptions: HYDROCODONE-ACETAMINOPHEN 5-500 MG TABS (HYDROCODONE-ACETAMINOPHEN) take one tablet by mouth every 4-6 hours as needed pain  #40 x 0   Entered and Authorized by:   Crawford Givens MD   Signed by:   Crawford Givens MD on 11/25/2010   Method used:   Telephoned to ...       [facility] [address] [facility]       [address]       [place], [place]  [address]       Ph: [phone] or [phone]       Fax: [phone]   RxID:   [id number]

## 2010-12-31 ENCOUNTER — Telehealth: Payer: Self-pay | Admitting: Family Medicine

## 2011-01-04 NOTE — Progress Notes (Signed)
Summary: Rx Hydrocodone-APAP  Phone Note Refill Request Call back at [phone] Message [facility]:  [facility] on December 31, 2010 10:32 AM  Refills Requested: Medication #1:  HYDROCODONE-ACETAMINOPHEN 5-500 MG TABS take one tablet by mouth every 4-6 hours as needed pain   Last Refilled: 11/25/2010 Received faxed refill request, Dr. Dayton Martes is out of the office today with limited access to her computer.  Please advise.   Method Requested: Telephone to Pharmacy Initial call taken by: Linde Gillis CMA Duncan Dull),  December 31, 2010 10:33 AM  Follow-up for Phone Call        may refill.  plz phone in and notify pt.  noted in new pt OV that pt has taken longterm.  will forward to pcp as well. Follow-up by: Eustaquio Boyden  MD,  December 31, 2010 11:46 AM  Additional Follow-up for Phone Call Additional follow up Details #1::        Rx called to pharmacy Additional Follow-up by: Linde Gillis CMA Duncan Dull),  December 31, 2010 12:14 PM    Prescriptions: HYDROCODONE-ACETAMINOPHEN 5-500 MG TABS (HYDROCODONE-ACETAMINOPHEN) take one tablet by mouth every 4-6 hours as needed pain  #40 x 0   Entered and Authorized by:   Eustaquio Boyden  MD   Signed by:   Eustaquio Boyden  MD on 12/31/2010   Method used:   Telephoned to ...       [facility] [facility]       [address]       [place], [place]  [address]       Ph: [phone] or [phone]       Fax: [phone]   RxID:   [id number]

## 2011-01-07 LAB — CBC
HCT: 36.3 % — ABNORMAL LOW (ref 39.0–52.0)
HCT: 40.1 % (ref 39.0–52.0)
HCT: 40.8 % (ref 39.0–52.0)
Hemoglobin: 12 g/dL — ABNORMAL LOW (ref 13.0–17.0)
Hemoglobin: 13.3 g/dL (ref 13.0–17.0)
MCV: 87.6 fL (ref 78.0–100.0)
Platelets: 218 10*3/uL (ref 150–400)
Platelets: 226 10*3/uL (ref 150–400)
RBC: 4.14 MIL/uL — ABNORMAL LOW (ref 4.22–5.81)
RBC: 4.64 MIL/uL (ref 4.22–5.81)
RDW: 15.6 % — ABNORMAL HIGH (ref 11.5–15.5)
RDW: 15.6 % — ABNORMAL HIGH (ref 11.5–15.5)
WBC: 10.1 10*3/uL (ref 4.0–10.5)

## 2011-01-07 LAB — CARDIAC PANEL(CRET KIN+CKTOT+MB+TROPI)
CK, MB: 3.3 ng/mL (ref 0.3–4.0)
Relative Index: 2.6 — ABNORMAL HIGH (ref 0.0–2.5)
Total CK: 127 U/L (ref 7–232)
Total CK: 141 U/L (ref 7–232)
Troponin I: 0.03 ng/mL (ref 0.00–0.06)
Troponin I: 0.05 ng/mL (ref 0.00–0.06)

## 2011-01-07 LAB — COMPREHENSIVE METABOLIC PANEL
ALT: 21 U/L (ref 0–53)
Albumin: 2.8 g/dL — ABNORMAL LOW (ref 3.5–5.2)
Alkaline Phosphatase: 106 U/L (ref 39–117)
Alkaline Phosphatase: 120 U/L — ABNORMAL HIGH (ref 39–117)
BUN: 17 mg/dL (ref 6–23)
BUN: 29 mg/dL — ABNORMAL HIGH (ref 6–23)
CO2: 28 mEq/L (ref 19–32)
CO2: 31 mEq/L (ref 19–32)
Chloride: 104 mEq/L (ref 96–112)
Chloride: 105 mEq/L (ref 96–112)
GFR calc non Af Amer: >60 mL/min (ref >60)
GFR calc non Af Amer: >60 mL/min (ref >60)
Glucose, Bld: 129 mg/dL — ABNORMAL HIGH (ref 70–99)
Glucose, Bld: 145 mg/dL — ABNORMAL HIGH (ref 70–99)
Potassium: 4.1 mEq/L (ref 3.5–5.1)
Potassium: 4.1 mEq/L (ref 3.5–5.1)
Sodium: 139 mEq/L (ref 135–145)
Total Bilirubin: 0.3 mg/dL (ref 0.3–1.2)
Total Bilirubin: 0.4 mg/dL (ref 0.3–1.2)
Total Protein: 6.5 g/dL (ref 6.0–8.3)

## 2011-01-07 LAB — BASIC METABOLIC PANEL
BUN: 16 mg/dL (ref 6–23)
Chloride: 105 mEq/L (ref 96–112)
Creatinine, Ser: 1.08 mg/dL (ref 0.4–1.5)
GFR calc Af Amer: >60 mL/min (ref >60)
GFR calc non Af Amer: >60 mL/min (ref >60)
Potassium: 4.7 mEq/L (ref 3.5–5.1)

## 2011-01-07 LAB — DIFFERENTIAL
Basophils Absolute: 0.1 10*3/uL (ref 0.0–0.1)
Basophils Absolute: 0.1 10*3/uL (ref 0.0–0.1)
Basophils Relative: 1 % (ref 0–1)
Basophils Relative: 1 % (ref 0–1)
Eosinophils Absolute: 0.2 10*3/uL (ref 0.0–0.7)
Monocytes Absolute: 1 10*3/uL (ref 0.1–1.0)
Monocytes Relative: 9 % (ref 3–12)
Neutro Abs: 5.4 10*3/uL (ref 1.7–7.7)
Neutro Abs: 7.1 10*3/uL (ref 1.7–7.7)
Neutrophils Relative %: 71 % (ref 43–77)

## 2011-01-07 LAB — URINALYSIS, ROUTINE W REFLEX MICROSCOPIC
Glucose, UA: NEGATIVE mg/dL
Ketones, ur: NEGATIVE mg/dL
Protein, ur: NEGATIVE mg/dL
Specific Gravity, Urine: 1.025 (ref 1.005–1.030)
Urobilinogen, UA: 0.2 mg/dL (ref 0.0–1.0)

## 2011-01-07 LAB — CULTURE, BLOOD (ROUTINE X 2)

## 2011-01-07 LAB — TROPONIN I: Troponin I: 0.03 ng/mL (ref 0.00–0.06)

## 2011-01-07 LAB — CK TOTAL AND CKMB (NOT AT ARMC): Relative Index: 1.6 (ref 0.0–2.5)

## 2011-01-07 LAB — LIPASE, BLOOD: Lipase: 25 U/L (ref 11–59)

## 2011-01-07 LAB — LIPID PANEL: Cholesterol: 166 mg/dL (ref 0–200)

## 2011-01-07 LAB — POCT CARDIAC MARKERS: Troponin i, poc: <0.05 ng/mL (ref 0.00–0.09)

## 2011-01-10 LAB — BASIC METABOLIC PANEL
BUN: 13 mg/dL (ref 6–23)
BUN: 15 mg/dL (ref 6–23)
BUN: 6 mg/dL (ref 6–23)
Calcium: 7.7 mg/dL — ABNORMAL LOW (ref 8.4–10.5)
Calcium: 7.9 mg/dL — ABNORMAL LOW (ref 8.4–10.5)
Chloride: 112 mEq/L (ref 96–112)
Creatinine, Ser: 0.87 mg/dL (ref 0.4–1.5)
GFR calc non Af Amer: >60 mL/min (ref >60)
GFR calc non Af Amer: >60 mL/min (ref >60)
Glucose, Bld: 120 mg/dL — ABNORMAL HIGH (ref 70–99)
Glucose, Bld: 121 mg/dL — ABNORMAL HIGH (ref 70–99)
Potassium: 3.7 mEq/L (ref 3.5–5.1)
Sodium: 141 mEq/L (ref 135–145)

## 2011-01-10 LAB — DIFFERENTIAL
Basophils Absolute: 0 10*3/uL (ref 0.0–0.1)
Lymphocytes Relative: 11 % — ABNORMAL LOW (ref 12–46)
Neutro Abs: 8.6 10*3/uL — ABNORMAL HIGH (ref 1.7–7.7)

## 2011-01-10 LAB — CARDIAC PANEL(CRET KIN+CKTOT+MB+TROPI)
CK, MB: 3.1 ng/mL (ref 0.3–4.0)
CK, MB: 3.1 ng/mL (ref 0.3–4.0)
Relative Index: 1.9 (ref 0.0–2.5)
Total CK: 162 U/L (ref 7–232)
Troponin I: 0.02 ng/mL (ref 0.00–0.06)
Troponin I: <0.01 ng/mL (ref 0.00–0.06)

## 2011-01-10 LAB — CROSSMATCH

## 2011-01-10 LAB — HEPATIC FUNCTION PANEL
ALT: 18 U/L (ref 0–53)
Bilirubin, Direct: 0.2 mg/dL (ref 0.0–0.3)
Indirect Bilirubin: 0.5 mg/dL (ref 0.3–0.9)
Total Bilirubin: 0.7 mg/dL (ref 0.3–1.2)

## 2011-01-10 LAB — PROTIME-INR
INR: 1.08 (ref 0.00–1.49)
Prothrombin Time: 13.9 seconds (ref 11.6–15.2)

## 2011-01-10 LAB — CBC
HCT: 22.4 % — ABNORMAL LOW (ref 39.0–52.0)
HCT: 26.2 % — ABNORMAL LOW (ref 39.0–52.0)
HCT: 27.9 % — ABNORMAL LOW (ref 39.0–52.0)
Hemoglobin: 7.2 g/dL — ABNORMAL LOW (ref 13.0–17.0)
Hemoglobin: 8.1 g/dL — ABNORMAL LOW (ref 13.0–17.0)
Hemoglobin: 8.6 g/dL — ABNORMAL LOW (ref 13.0–17.0)
Hemoglobin: 9.1 g/dL — ABNORMAL LOW (ref 13.0–17.0)
MCV: 87.2 fL (ref 78.0–100.0)
Platelets: 279 10*3/uL (ref 150–400)
Platelets: 288 10*3/uL (ref 150–400)
Platelets: 308 10*3/uL (ref 150–400)
Platelets: 323 10*3/uL (ref 150–400)
RBC: 2.99 MIL/uL — ABNORMAL LOW (ref 4.22–5.81)
RDW: 16.6 % — ABNORMAL HIGH (ref 11.5–15.5)
WBC: 7.4 10*3/uL (ref 4.0–10.5)
WBC: 7.4 10*3/uL (ref 4.0–10.5)
WBC: 9 10*3/uL (ref 4.0–10.5)
WBC: 9 10*3/uL (ref 4.0–10.5)

## 2011-01-10 LAB — HEMOGLOBIN AND HEMATOCRIT, BLOOD
HCT: 26.4 % — ABNORMAL LOW (ref 39.0–52.0)
HCT: 29.1 % — ABNORMAL LOW (ref 39.0–52.0)

## 2011-01-10 LAB — POCT CARDIAC MARKERS
CKMB, poc: 2.8 ng/mL (ref 1.0–8.0)
Troponin i, poc: <0.05 ng/mL (ref 0.00–0.09)

## 2011-01-10 LAB — ABO/RH: ABO/RH(D): B NEG

## 2011-01-20 ENCOUNTER — Encounter: Payer: Self-pay | Admitting: Family Medicine

## 2011-01-27 LAB — CBC
HCT: 25.9 % — ABNORMAL LOW (ref 39.0–52.0)
HCT: 26.1 % — ABNORMAL LOW (ref 39.0–52.0)
HCT: 26.5 % — ABNORMAL LOW (ref 39.0–52.0)
HCT: 28.4 % — ABNORMAL LOW (ref 39.0–52.0)
HCT: 28.5 % — ABNORMAL LOW (ref 39.0–52.0)
HCT: 29 % — ABNORMAL LOW (ref 39.0–52.0)
HCT: 29 % — ABNORMAL LOW (ref 39.0–52.0)
HCT: 29.4 % — ABNORMAL LOW (ref 39.0–52.0)
HCT: 31.3 % — ABNORMAL LOW (ref 39.0–52.0)
HCT: 31.3 % — ABNORMAL LOW (ref 39.0–52.0)
HCT: 31.3 % — ABNORMAL LOW (ref 39.0–52.0)
HCT: 32.7 % — ABNORMAL LOW (ref 39.0–52.0)
Hemoglobin: 10.2 g/dL — ABNORMAL LOW (ref 13.0–17.0)
Hemoglobin: 10.5 g/dL — ABNORMAL LOW (ref 13.0–17.0)
Hemoglobin: 10.6 g/dL — ABNORMAL LOW (ref 13.0–17.0)
Hemoglobin: 8.4 g/dL — ABNORMAL LOW (ref 13.0–17.0)
Hemoglobin: 8.7 g/dL — ABNORMAL LOW (ref 13.0–17.0)
Hemoglobin: 8.8 g/dL — ABNORMAL LOW (ref 13.0–17.0)
Hemoglobin: 9.3 g/dL — ABNORMAL LOW (ref 13.0–17.0)
Hemoglobin: 9.4 g/dL — ABNORMAL LOW (ref 13.0–17.0)
Hemoglobin: 9.5 g/dL — ABNORMAL LOW (ref 13.0–17.0)
Hemoglobin: 9.5 g/dL — ABNORMAL LOW (ref 13.0–17.0)
Hemoglobin: 9.6 g/dL — ABNORMAL LOW (ref 13.0–17.0)
Hemoglobin: 9.9 g/dL — ABNORMAL LOW (ref 13.0–17.0)
MCHC: 31.6 g/dL (ref 30.0–36.0)
MCHC: 32.4 g/dL (ref 30.0–36.0)
MCHC: 32.4 g/dL (ref 30.0–36.0)
MCHC: 32.4 g/dL (ref 30.0–36.0)
MCHC: 32.5 g/dL (ref 30.0–36.0)
MCHC: 32.7 g/dL (ref 30.0–36.0)
MCHC: 32.7 g/dL (ref 30.0–36.0)
MCHC: 32.8 g/dL (ref 30.0–36.0)
MCHC: 32.9 g/dL (ref 30.0–36.0)
MCHC: 33.4 g/dL (ref 30.0–36.0)
MCHC: 33.5 g/dL (ref 30.0–36.0)
MCHC: 33.6 g/dL (ref 30.0–36.0)
MCHC: 33.9 g/dL (ref 30.0–36.0)
MCV: 78.5 fL (ref 78.0–100.0)
MCV: 79.3 fL (ref 78.0–100.0)
MCV: 79.6 fL (ref 78.0–100.0)
MCV: 79.7 fL (ref 78.0–100.0)
MCV: 79.8 fL (ref 78.0–100.0)
MCV: 79.8 fL (ref 78.0–100.0)
MCV: 80.2 fL (ref 78.0–100.0)
MCV: 80.3 fL (ref 78.0–100.0)
MCV: 80.6 fL (ref 78.0–100.0)
MCV: 80.9 fL (ref 78.0–100.0)
MCV: 81.1 fL (ref 78.0–100.0)
MCV: 81.2 fL (ref 78.0–100.0)
MCV: 81.6 fL (ref 78.0–100.0)
MCV: 81.8 fL (ref 78.0–100.0)
Platelets: 183 10*3/uL (ref 150–400)
Platelets: 188 10*3/uL (ref 150–400)
Platelets: 199 10*3/uL (ref 150–400)
Platelets: 206 10*3/uL (ref 150–400)
Platelets: 214 10*3/uL (ref 150–400)
Platelets: 220 10*3/uL (ref 150–400)
Platelets: 250 10*3/uL (ref 150–400)
Platelets: 255 10*3/uL (ref 150–400)
Platelets: 258 10*3/uL (ref 150–400)
Platelets: 263 10*3/uL (ref 150–400)
Platelets: 392 10*3/uL (ref 150–400)
RBC: 3.22 MIL/uL — ABNORMAL LOW (ref 4.22–5.81)
RBC: 3.23 MIL/uL — ABNORMAL LOW (ref 4.22–5.81)
RBC: 3.48 MIL/uL — ABNORMAL LOW (ref 4.22–5.81)
RBC: 3.49 MIL/uL — ABNORMAL LOW (ref 4.22–5.81)
RBC: 3.57 MIL/uL — ABNORMAL LOW (ref 4.22–5.81)
RBC: 3.64 MIL/uL — ABNORMAL LOW (ref 4.22–5.81)
RBC: 3.69 MIL/uL — ABNORMAL LOW (ref 4.22–5.81)
RBC: 3.7 MIL/uL — ABNORMAL LOW (ref 4.22–5.81)
RBC: 3.88 MIL/uL — ABNORMAL LOW (ref 4.22–5.81)
RBC: 3.93 MIL/uL — ABNORMAL LOW (ref 4.22–5.81)
RDW: 18.9 % — ABNORMAL HIGH (ref 11.5–15.5)
RDW: 19.6 % — ABNORMAL HIGH (ref 11.5–15.5)
RDW: 20.3 % — ABNORMAL HIGH (ref 11.5–15.5)
RDW: 20.8 % — ABNORMAL HIGH (ref 11.5–15.5)
RDW: 20.9 % — ABNORMAL HIGH (ref 11.5–15.5)
RDW: 20.9 % — ABNORMAL HIGH (ref 11.5–15.5)
RDW: 21.1 % — ABNORMAL HIGH (ref 11.5–15.5)
RDW: 21.2 % — ABNORMAL HIGH (ref 11.5–15.5)
RDW: 21.2 % — ABNORMAL HIGH (ref 11.5–15.5)
RDW: 21.4 % — ABNORMAL HIGH (ref 11.5–15.5)
RDW: 21.7 % — ABNORMAL HIGH (ref 11.5–15.5)
RDW: 22.1 % — ABNORMAL HIGH (ref 11.5–15.5)
WBC: 11.3 10*3/uL — ABNORMAL HIGH (ref 4.0–10.5)
WBC: 12.7 10*3/uL — ABNORMAL HIGH (ref 4.0–10.5)
WBC: 12.7 10*3/uL — ABNORMAL HIGH (ref 4.0–10.5)
WBC: 12.8 10*3/uL — ABNORMAL HIGH (ref 4.0–10.5)
WBC: 15.2 10*3/uL — ABNORMAL HIGH (ref 4.0–10.5)
WBC: 16.1 10*3/uL — ABNORMAL HIGH (ref 4.0–10.5)
WBC: 8 10*3/uL (ref 4.0–10.5)
WBC: 8.5 10*3/uL (ref 4.0–10.5)
WBC: 8.7 10*3/uL (ref 4.0–10.5)
WBC: 9.6 10*3/uL (ref 4.0–10.5)
WBC: 9.6 10*3/uL (ref 4.0–10.5)

## 2011-01-27 LAB — CARDIAC PANEL(CRET KIN+CKTOT+MB+TROPI)
Relative Index: INVALID (ref 0.0–2.5)
Relative Index: INVALID (ref 0.0–2.5)
Total CK: 47 U/L (ref 7–232)
Total CK: 64 U/L (ref 7–232)
Troponin I: 1.05 ng/mL (ref 0.00–0.06)

## 2011-01-27 LAB — BLOOD GAS, ARTERIAL
Acid-Base Excess: 1.6 mmol/L (ref 0.0–2.0)
Acid-Base Excess: 2.4 mmol/L — ABNORMAL HIGH (ref 0.0–2.0)
Bicarbonate: 25.9 mEq/L — ABNORMAL HIGH (ref 20.0–24.0)
Bicarbonate: 26 mEq/L — ABNORMAL HIGH (ref 20.0–24.0)
FIO2: 0.21 %
FIO2: 0.21 %
O2 Saturation: 89.8 %
O2 Saturation: 90.9 %
Patient temperature: 98.6
Patient temperature: 98.6
TCO2: 27 mmol/L (ref 0–100)
TCO2: 27.4 mmol/L (ref 0–100)
pCO2 arterial: 36.2 mmHg (ref 35.0–45.0)
pCO2 arterial: 43.3 mmHg (ref 35.0–45.0)
pH, Arterial: 7.396 (ref 7.350–7.450)
pH, Arterial: 7.468 — ABNORMAL HIGH (ref 7.350–7.450)
pO2, Arterial: 56.7 mmHg — ABNORMAL LOW (ref 80.0–100.0)
pO2, Arterial: 58.3 mmHg — ABNORMAL LOW (ref 80.0–100.0)

## 2011-01-27 LAB — GLUCOSE, CAPILLARY
Glucose-Capillary: 105 mg/dL — ABNORMAL HIGH (ref 70–99)
Glucose-Capillary: 107 mg/dL — ABNORMAL HIGH (ref 70–99)
Glucose-Capillary: 108 mg/dL — ABNORMAL HIGH (ref 70–99)
Glucose-Capillary: 110 mg/dL — ABNORMAL HIGH (ref 70–99)
Glucose-Capillary: 114 mg/dL — ABNORMAL HIGH (ref 70–99)
Glucose-Capillary: 119 mg/dL — ABNORMAL HIGH (ref 70–99)
Glucose-Capillary: 120 mg/dL — ABNORMAL HIGH (ref 70–99)
Glucose-Capillary: 121 mg/dL — ABNORMAL HIGH (ref 70–99)
Glucose-Capillary: 128 mg/dL — ABNORMAL HIGH (ref 70–99)
Glucose-Capillary: 130 mg/dL — ABNORMAL HIGH (ref 70–99)
Glucose-Capillary: 130 mg/dL — ABNORMAL HIGH (ref 70–99)
Glucose-Capillary: 131 mg/dL — ABNORMAL HIGH (ref 70–99)
Glucose-Capillary: 132 mg/dL — ABNORMAL HIGH (ref 70–99)
Glucose-Capillary: 133 mg/dL — ABNORMAL HIGH (ref 70–99)
Glucose-Capillary: 137 mg/dL — ABNORMAL HIGH (ref 70–99)
Glucose-Capillary: 139 mg/dL — ABNORMAL HIGH (ref 70–99)
Glucose-Capillary: 141 mg/dL — ABNORMAL HIGH (ref 70–99)
Glucose-Capillary: 172 mg/dL — ABNORMAL HIGH (ref 70–99)
Glucose-Capillary: 95 mg/dL (ref 70–99)

## 2011-01-27 LAB — POCT I-STAT 4, (NA,K, GLUC, HGB,HCT)
Glucose, Bld: 100 mg/dL — ABNORMAL HIGH (ref 70–99)
Glucose, Bld: 113 mg/dL — ABNORMAL HIGH (ref 70–99)
Glucose, Bld: 124 mg/dL — ABNORMAL HIGH (ref 70–99)
HCT: 27 % — ABNORMAL LOW (ref 39.0–52.0)
HCT: 33 % — ABNORMAL LOW (ref 39.0–52.0)
Hemoglobin: 10.2 g/dL — ABNORMAL LOW (ref 13.0–17.0)
Hemoglobin: 11.2 g/dL — ABNORMAL LOW (ref 13.0–17.0)
Potassium: 5.6 mEq/L — ABNORMAL HIGH (ref 3.5–5.1)
Sodium: 133 mEq/L — ABNORMAL LOW (ref 135–145)
Sodium: 138 mEq/L (ref 135–145)

## 2011-01-27 LAB — BASIC METABOLIC PANEL
BUN: 11 mg/dL (ref 6–23)
BUN: 13 mg/dL (ref 6–23)
BUN: 13 mg/dL (ref 6–23)
BUN: 7 mg/dL (ref 6–23)
BUN: 8 mg/dL (ref 6–23)
BUN: 9 mg/dL (ref 6–23)
CO2: 23 mEq/L (ref 19–32)
CO2: 25 mEq/L (ref 19–32)
CO2: 25 mEq/L (ref 19–32)
CO2: 26 mEq/L (ref 19–32)
CO2: 26 mEq/L (ref 19–32)
CO2: 28 mEq/L (ref 19–32)
CO2: 29 mEq/L (ref 19–32)
CO2: 29 mEq/L (ref 19–32)
Calcium: 7.6 mg/dL — ABNORMAL LOW (ref 8.4–10.5)
Calcium: 7.9 mg/dL — ABNORMAL LOW (ref 8.4–10.5)
Calcium: 8 mg/dL — ABNORMAL LOW (ref 8.4–10.5)
Calcium: 8.1 mg/dL — ABNORMAL LOW (ref 8.4–10.5)
Calcium: 8.5 mg/dL (ref 8.4–10.5)
Chloride: 102 mEq/L (ref 96–112)
Chloride: 104 mEq/L (ref 96–112)
Chloride: 104 mEq/L (ref 96–112)
Chloride: 108 mEq/L (ref 96–112)
Chloride: 96 mEq/L (ref 96–112)
Creatinine, Ser: 0.81 mg/dL (ref 0.4–1.5)
Creatinine, Ser: 0.81 mg/dL (ref 0.4–1.5)
Creatinine, Ser: 0.83 mg/dL (ref 0.4–1.5)
Creatinine, Ser: 0.91 mg/dL (ref 0.4–1.5)
Creatinine, Ser: 1.06 mg/dL (ref 0.4–1.5)
Creatinine, Ser: 1.06 mg/dL (ref 0.4–1.5)
Creatinine, Ser: 1.07 mg/dL (ref 0.4–1.5)
GFR calc Af Amer: >60 mL/min (ref >60)
GFR calc Af Amer: >60 mL/min (ref >60)
GFR calc Af Amer: >60 mL/min (ref >60)
GFR calc Af Amer: >60 mL/min (ref >60)
GFR calc Af Amer: >60 mL/min (ref >60)
GFR calc non Af Amer: >60 mL/min (ref >60)
GFR calc non Af Amer: >60 mL/min (ref >60)
GFR calc non Af Amer: >60 mL/min (ref >60)
GFR calc non Af Amer: >60 mL/min (ref >60)
GFR calc non Af Amer: >60 mL/min (ref >60)
Glucose, Bld: 104 mg/dL — ABNORMAL HIGH (ref 70–99)
Glucose, Bld: 107 mg/dL — ABNORMAL HIGH (ref 70–99)
Glucose, Bld: 115 mg/dL — ABNORMAL HIGH (ref 70–99)
Glucose, Bld: 119 mg/dL — ABNORMAL HIGH (ref 70–99)
Glucose, Bld: 128 mg/dL — ABNORMAL HIGH (ref 70–99)
Glucose, Bld: 139 mg/dL — ABNORMAL HIGH (ref 70–99)
Glucose, Bld: 146 mg/dL — ABNORMAL HIGH (ref 70–99)
Potassium: 3.6 mEq/L (ref 3.5–5.1)
Potassium: 4 mEq/L (ref 3.5–5.1)
Potassium: 4 mEq/L (ref 3.5–5.1)
Potassium: 4.2 mEq/L (ref 3.5–5.1)
Potassium: 4.8 mEq/L (ref 3.5–5.1)
Sodium: 133 mEq/L — ABNORMAL LOW (ref 135–145)
Sodium: 136 mEq/L (ref 135–145)
Sodium: 137 mEq/L (ref 135–145)
Sodium: 138 mEq/L (ref 135–145)
Sodium: 139 mEq/L (ref 135–145)

## 2011-01-27 LAB — POCT I-STAT 3, ART BLOOD GAS (G3+)
Acid-base deficit: 1 mmol/L (ref 0.0–2.0)
Acid-base deficit: 1 mmol/L (ref 0.0–2.0)
Acid-base deficit: 2 mmol/L (ref 0.0–2.0)
Acid-base deficit: 3 mmol/L — ABNORMAL HIGH (ref 0.0–2.0)
Acid-base deficit: 3 mmol/L — ABNORMAL HIGH (ref 0.0–2.0)
Bicarbonate: 22.4 mEq/L (ref 20.0–24.0)
Bicarbonate: 22.7 mEq/L (ref 20.0–24.0)
Bicarbonate: 23.2 mEq/L (ref 20.0–24.0)
Bicarbonate: 23.8 mEq/L (ref 20.0–24.0)
Bicarbonate: 24.1 mEq/L — ABNORMAL HIGH (ref 20.0–24.0)
Bicarbonate: 25.3 mEq/L — ABNORMAL HIGH (ref 20.0–24.0)
O2 Saturation: 94 %
O2 Saturation: 95 %
O2 Saturation: 95 %
O2 Saturation: 96 %
O2 Saturation: 99 %
Patient temperature: 31.5
Patient temperature: 36.7
Patient temperature: 36.7
Patient temperature: 36.8
Patient temperature: 37
Patient temperature: 37.5
Patient temperature: 37.5
TCO2: 23 mmol/L (ref 0–100)
TCO2: 24 mmol/L (ref 0–100)
TCO2: 26 mmol/L (ref 0–100)
TCO2: 26 mmol/L (ref 0–100)
TCO2: 26 mmol/L (ref 0–100)
TCO2: 27 mmol/L (ref 0–100)
pCO2 arterial: 39 mmHg (ref 35.0–45.0)
pCO2 arterial: 41.6 mmHg (ref 35.0–45.0)
pCO2 arterial: 46.2 mmHg — ABNORMAL HIGH (ref 35.0–45.0)
pCO2 arterial: 53.5 mmHg — ABNORMAL HIGH (ref 35.0–45.0)
pH, Arterial: 7.285 — ABNORMAL LOW (ref 7.350–7.450)
pH, Arterial: 7.285 — ABNORMAL LOW (ref 7.350–7.450)
pH, Arterial: 7.325 — ABNORMAL LOW (ref 7.350–7.450)
pH, Arterial: 7.338 — ABNORMAL LOW (ref 7.350–7.450)
pH, Arterial: 7.356 (ref 7.350–7.450)
pH, Arterial: 7.369 (ref 7.350–7.450)
pH, Arterial: 7.372 (ref 7.350–7.450)
pH, Arterial: 7.524 — ABNORMAL HIGH (ref 7.350–7.450)
pO2, Arterial: 111 mmHg — ABNORMAL HIGH (ref 80.0–100.0)
pO2, Arterial: 76 mmHg — ABNORMAL LOW (ref 80.0–100.0)
pO2, Arterial: 82 mmHg (ref 80.0–100.0)
pO2, Arterial: 83 mmHg (ref 80.0–100.0)
pO2, Arterial: 83 mmHg (ref 80.0–100.0)
pO2, Arterial: 91 mmHg (ref 80.0–100.0)

## 2011-01-27 LAB — URINALYSIS, ROUTINE W REFLEX MICROSCOPIC
Nitrite: NEGATIVE
Protein, ur: NEGATIVE mg/dL
Urobilinogen, UA: 0.2 mg/dL (ref 0.0–1.0)

## 2011-01-27 LAB — CREATININE, SERUM
Creatinine, Ser: 0.84 mg/dL (ref 0.4–1.5)
Creatinine, Ser: 1.04 mg/dL (ref 0.4–1.5)
GFR calc Af Amer: >60 mL/min (ref >60)
GFR calc Af Amer: >60 mL/min (ref >60)
GFR calc non Af Amer: >60 mL/min (ref >60)
GFR calc non Af Amer: >60 mL/min (ref >60)

## 2011-01-27 LAB — CROSSMATCH

## 2011-01-27 LAB — COMPREHENSIVE METABOLIC PANEL
ALT: 12 U/L (ref 0–53)
AST: 16 U/L (ref 0–37)
AST: 16 U/L (ref 0–37)
Albumin: 2.8 g/dL — ABNORMAL LOW (ref 3.5–5.2)
Albumin: 3.3 g/dL — ABNORMAL LOW (ref 3.5–5.2)
Alkaline Phosphatase: 118 U/L — ABNORMAL HIGH (ref 39–117)
Alkaline Phosphatase: 99 U/L (ref 39–117)
BUN: 10 mg/dL (ref 6–23)
BUN: 7 mg/dL (ref 6–23)
CO2: 28 mEq/L (ref 19–32)
Calcium: 8.5 mg/dL (ref 8.4–10.5)
Chloride: 106 mEq/L (ref 96–112)
Chloride: 107 mEq/L (ref 96–112)
Creatinine, Ser: 0.94 mg/dL (ref 0.4–1.5)
GFR calc Af Amer: >60 mL/min (ref >60)
GFR calc non Af Amer: >60 mL/min (ref >60)
Glucose, Bld: 129 mg/dL — ABNORMAL HIGH (ref 70–99)
Potassium: 3.6 mEq/L (ref 3.5–5.1)
Potassium: 4.3 mEq/L (ref 3.5–5.1)
Sodium: 140 mEq/L (ref 135–145)
Total Bilirubin: 0.3 mg/dL (ref 0.3–1.2)
Total Bilirubin: 0.5 mg/dL (ref 0.3–1.2)
Total Protein: 5.8 g/dL — ABNORMAL LOW (ref 6.0–8.3)

## 2011-01-27 LAB — POCT I-STAT, CHEM 8
BUN: 9 mg/dL (ref 6–23)
Calcium, Ion: 1.08 mmol/L — ABNORMAL LOW (ref 1.12–1.32)
Chloride: 106 mEq/L (ref 96–112)
Glucose, Bld: 145 mg/dL — ABNORMAL HIGH (ref 70–99)
Glucose, Bld: 163 mg/dL — ABNORMAL HIGH (ref 70–99)
HCT: 29 % — ABNORMAL LOW (ref 39.0–52.0)
HCT: 30 % — ABNORMAL LOW (ref 39.0–52.0)
Hemoglobin: 9.9 g/dL — ABNORMAL LOW (ref 13.0–17.0)
Potassium: 4.1 mEq/L (ref 3.5–5.1)
Potassium: 4.4 mEq/L (ref 3.5–5.1)
Sodium: 137 mEq/L (ref 135–145)

## 2011-01-27 LAB — LIPID PANEL
Cholesterol: 147 mg/dL (ref 0–200)
HDL: 29 mg/dL — ABNORMAL LOW (ref >39)
Total CHOL/HDL Ratio: 5.1 RATIO
Triglycerides: 94 mg/dL (ref <150)

## 2011-01-27 LAB — POCT I-STAT 3, VENOUS BLOOD GAS (G3P V)
Acid-base deficit: 2 mmol/L (ref 0.0–2.0)
O2 Saturation: 82 %
pO2, Ven: 32 mmHg (ref 30.0–45.0)

## 2011-01-27 LAB — MAGNESIUM
Magnesium: 2.7 mg/dL — ABNORMAL HIGH (ref 1.5–2.5)
Magnesium: 3 mg/dL — ABNORMAL HIGH (ref 1.5–2.5)
Magnesium: 3 mg/dL — ABNORMAL HIGH (ref 1.5–2.5)

## 2011-01-27 LAB — CK TOTAL AND CKMB (NOT AT ARMC): CK, MB: 3.1 ng/mL (ref 0.3–4.0)

## 2011-01-27 LAB — APTT: aPTT: 40 seconds — ABNORMAL HIGH (ref 24–37)

## 2011-01-27 LAB — HEPARIN LEVEL (UNFRACTIONATED)
Heparin Unfractionated: 0.29 IU/mL — ABNORMAL LOW (ref 0.30–0.70)
Heparin Unfractionated: 0.41 IU/mL (ref 0.30–0.70)
Heparin Unfractionated: 0.75 IU/mL — ABNORMAL HIGH (ref 0.30–0.70)
Heparin Unfractionated: <0.1 IU/mL — ABNORMAL LOW (ref 0.30–0.70)

## 2011-01-27 LAB — BRAIN NATRIURETIC PEPTIDE
Pro B Natriuretic peptide (BNP): 564 pg/mL — ABNORMAL HIGH (ref 0.0–100.0)
Pro B Natriuretic peptide (BNP): 659 pg/mL — ABNORMAL HIGH (ref 0.0–100.0)

## 2011-01-27 LAB — VITAMIN B12: Vitamin B-12: 351 pg/mL (ref 211–911)

## 2011-01-27 LAB — TSH: TSH: 3.502 u[IU]/mL (ref 0.350–4.500)

## 2011-01-27 LAB — DIFFERENTIAL
Basophils Absolute: 0.2 10*3/uL — ABNORMAL HIGH (ref 0.0–0.1)
Basophils Relative: 2 % — ABNORMAL HIGH (ref 0–1)
Eosinophils Absolute: 0.1 10*3/uL (ref 0.0–0.7)
Eosinophils Relative: 1 % (ref 0–5)
Monocytes Absolute: 1 10*3/uL (ref 0.1–1.0)
Neutro Abs: 8.4 10*3/uL — ABNORMAL HIGH (ref 1.7–7.7)

## 2011-01-27 LAB — PREPARE PLATELETS

## 2011-01-27 LAB — PROTIME-INR
INR: 1.4 (ref 0.00–1.49)
Prothrombin Time: 18 seconds — ABNORMAL HIGH (ref 11.6–15.2)

## 2011-01-27 LAB — HEMOGLOBIN AND HEMATOCRIT, BLOOD: HCT: 24.6 % — ABNORMAL LOW (ref 39.0–52.0)

## 2011-02-03 ENCOUNTER — Encounter: Payer: Self-pay | Admitting: Family Medicine

## 2011-02-03 ENCOUNTER — Ambulatory Visit (INDEPENDENT_AMBULATORY_CARE_PROVIDER_SITE_OTHER): Payer: Medicare Other | Admitting: Family Medicine

## 2011-02-03 DIAGNOSIS — E119 Type 2 diabetes mellitus without complications: Secondary | ICD-10-CM

## 2011-02-03 DIAGNOSIS — R5381 Other malaise: Secondary | ICD-10-CM

## 2011-02-03 DIAGNOSIS — R5383 Other fatigue: Secondary | ICD-10-CM

## 2011-02-03 DIAGNOSIS — K921 Melena: Secondary | ICD-10-CM | POA: Insufficient documentation

## 2011-02-03 LAB — BASIC METABOLIC PANEL
CO2: 30 mEq/L (ref 19–32)
Chloride: 103 mEq/L (ref 96–112)
Potassium: 4.4 mEq/L (ref 3.5–5.1)
Sodium: 143 mEq/L (ref 135–145)

## 2011-02-03 LAB — CBC WITH DIFFERENTIAL/PLATELET
Basophils Absolute: 0 10*3/uL (ref 0.0–0.1)
Eosinophils Absolute: 0.2 10*3/uL (ref 0.0–0.7)
Hemoglobin: 13.8 g/dL (ref 13.0–17.0)
Lymphocytes Relative: 16.2 % (ref 12.0–46.0)
Monocytes Relative: 8.1 % (ref 3.0–12.0)
Neutrophils Relative %: 73.8 % (ref 43.0–77.0)
Platelets: 234 10*3/uL (ref 150.0–400.0)
RDW: 15.7 % — ABNORMAL HIGH (ref 11.5–14.6)

## 2011-02-03 LAB — HEMOGLOBIN A1C: Hgb A1c MFr Bld: 6.9 % — ABNORMAL HIGH (ref 4.6–6.5)

## 2011-02-03 MED ORDER — FREESTYLE SYSTEM KIT: 1.0000 | PACK | Status: DC | PRN

## 2011-02-03 MED ORDER — BLOOD GLUC METER DISP-STRIPS DEVI: Status: DC

## 2011-02-03 MED ORDER — FREESTYLE LANCETS MISC: Status: DC

## 2011-02-03 NOTE — Progress Notes (Signed)
Addended by: Linde Gillis on: 02/03/2011 02:02 PM   Modules accepted: Orders

## 2011-02-03 NOTE — Assessment & Plan Note (Signed)
Spent 25 minutes counseling on diabetic diet, eat right hand out given. Recheck a1c today.

## 2011-02-03 NOTE — Assessment & Plan Note (Signed)
New.  Exam reassuring. ?possible fissure. Pt to let me know if symptoms return. Check CBC today given symptoms of fatigue.

## 2011-02-03 NOTE — Progress Notes (Signed)
70 yo here for multiple complaints.  1. Blood in stool- bright red blood in toilet paper when he wiped last week, has now resolved. Recent colonoscopy negative. No h/o hemorrhoids. Does feel tired and week all the time. No CP or SOB. No dizziness.  2.  DM- per pt, Dr. Alanda Amass recently diagnosed him with DM.  Started Metformin 500 mg two times a day earlier this year.   Denies any episodes of hypoglycemia. Lab Results  Component Value Date   HGBA1C  Value: 5.5 (NOTE)   The ADA recommends the following therapeutic goal for glycemic   control related to Hgb A1C measurement:   Goal of Therapy:   < 7.0% Hgb A1C   Reference: American Diabetes Association: Clinical Practice   Recommendations 2008, Diabetes Care,  2008, 31:(Suppl 1). 12/15/2008   The PMH, PSH, Social History, Family History, Medications, and allergies have been reviewed in [facility], and have been updated if relevant.  ROS:  See HPI.  Otherwise negative.  Physical exam: BP 110/80  Pulse 83  Temp(Src) 98.7 F (37.1 C) (Oral)  Ht 6\' 3"  (1.905 m)  Wt 255 lb (115.667 kg)  BMI 31.87 kg/m2  General:  obese male in nad Head:  normocephalic and atraumatic.   Eyes:  vision grossly intact, pupils equal, pupils round, and pupils reactive to light.   Ears:  cerumen impaction bilaterally, removed with saline irrigation and curette Nose:  deviated to right with obstruction Mouth:  clear Neck:  no jvd, tmg, LN Lungs:  mild decrease in breath sounds, no wheezing or rhonchi Heart:  rrr, no mrg Abdomen:  soft and nontender, bs+ Msk:  normal ROM.   Extremities:  mild ankle edema, no cyanosis. Neurologic:  alert and oriented, moves all 4. Skin:  turgor normal and color normal.   Rectal:  Normal tone, no hemorrhoids, no palpable fissure, guaic neg. Psych:  Cognition and judgment appear intact. Alert and cooperative with normal attention span and concentration. No apparent delusions, illusions, hallucinations

## 2011-02-04 ENCOUNTER — Other Ambulatory Visit: Payer: Self-pay | Admitting: Pulmonary Disease

## 2011-02-06 ENCOUNTER — Other Ambulatory Visit: Payer: Self-pay | Admitting: Family Medicine

## 2011-02-07 ENCOUNTER — Other Ambulatory Visit: Payer: Self-pay | Admitting: *Deleted

## 2011-02-07 MED ORDER — ALBUTEROL SULFATE HFA 108 (90 BASE) MCG/ACT IN AERS: 2.0000 | INHALATION_SPRAY | RESPIRATORY_TRACT | Status: DC | PRN

## 2011-02-08 NOTE — Telephone Encounter (Signed)
This was a blank order.  There was no documentation regarding which med needed to be refilled.  Therefore will sign off on this rx refill request.

## 2011-02-09 ENCOUNTER — Other Ambulatory Visit: Payer: Self-pay | Admitting: *Deleted

## 2011-02-09 NOTE — Telephone Encounter (Signed)
Opened in error

## 2011-02-11 ENCOUNTER — Other Ambulatory Visit: Payer: Self-pay | Admitting: *Deleted

## 2011-02-11 MED ORDER — HYDROCODONE-ACETAMINOPHEN 5-500 MG PO TABS: 1.0000 | ORAL_TABLET | ORAL | Status: DC | PRN

## 2011-02-11 NOTE — Telephone Encounter (Signed)
Rx called to [facility]. 

## 2011-02-28 ENCOUNTER — Other Ambulatory Visit: Payer: Self-pay | Admitting: *Deleted

## 2011-02-28 MED ORDER — CITALOPRAM HYDROBROMIDE 10 MG PO TABS: 10.0000 mg | ORAL_TABLET | Freq: Every day | ORAL | Status: DC

## 2011-02-28 NOTE — Telephone Encounter (Signed)
Received faxed refill request for a 90 day supply, please advise.

## 2011-03-01 NOTE — Discharge Summary (Signed)
NAMEJAVIEN, [name]              ACCOUNT NO.:  [id number]   MEDICAL RECORD NO.:  [id number]          PATIENT TYPE:  INP   LOCATION:  2020                         FACILITY:  [facility]   PHYSICIAN:  Juleen China IV, MDDATE OF BIRTH:  May 08, 1941   DATE OF ADMISSION:  01/03/2008  DATE OF DISCHARGE:  01/21/2008                               DISCHARGE SUMMARY   DISCHARGE DIAGNOSES:  1. Symptomatic abdominal aortic aneurysm.  2. Postoperative contusion and pneumonia.  3. Gastroesophageal reflux disease.  4. Hypertension.  5. Hypercholesteremia.  6. Chronic back pain.   PROCEDURES PERFORMED:  On January 04, 2008, endovascular repair of  abdominal aortic aneurysm.   DISCHARGE MEDICATIONS:  1. Lopressor 50 mg p.o. b.i.d.  2. Protonix 40 mg p.o. daily.  3. Seroquel 25 mg p.o. q.a.m. and 50 mg p.o. q.h.s.   DISPOSITION:  The patient is discharged to home with his wounds healing  well and in stable condition.  He is instructed to observe his wound for  increasing drainage, redness, pain, and fever greater than 101.2.  He is  given Extra Strength Tylenol for his pain management.  He is to see Dr.  Myra Gianotti in 1 month with an abdominal CT scan.  His office will call with  a date.   Brief identifying statement with complete details, please refer to the  typed operative report.  Briefly, this very pleasant 70 year old  gentleman presented to the emergency department at 5 p.m. on January 03, 2008, with a left flank pain radiating to his scrotum and back.  A CT  scan demonstrated a 7-cm abdominal aortic aneurysm.  He was evaluated by  Dr. Myra Gianotti who felt that he should undergo emergency endovascular  repair of his aneurysm.  He was informed of the risks and benefits of  the procedure and after careful consideration elected to proceed with  surgery .   HOSPITAL COURSE:  Preoperative workup was complete.  He was taken to the  operating room immediately and underwent the aforementioned  endovascular  repair of his symptomatic abdominal aortic aneurysm.  For complete  details, please refer to the typed operative report.  The procedure went  without complications.  He was to return to the postop bed in a surgical  intensive care unit.  We were able to extubate him.  In the late evening  hours of the operative day, he was noted to have decreased oxygen  saturations.  He was placed on BiPAP.  On the second postoperative day,  he was confused and agitated and required reintubation at 1820.  At this  time, we elected to get the critical care medicines involved.  We do  appreciate their diligent assistance.   He was found to have pneumonia.  He was placed on appropriate  antibiotics.  Over the next several days, we were able to wean his  ventilator.  He was confirmed to have hospital-acquired pneumonia, which  was not associated with mechanical ventilation.  His confusion and  delirium were cleared.  We did start him on tube feed.  We were able to  extubate him at  3.30.  He did remain very confused; however, this did  clear also.  His tube feedings were discontinued following extubation.  He was swallowing appropriately and he resumed eating.   We were able to transfer him to a bed on a surgical convalescent floor  on January 18, 2008.   Postoperatively, he demonstrated slow, but steady improvement.  He was  walking and improving with physical therapy.  His wounds were healing  well.  He was felt stable and was discharged home.  Home health was  contacted for physical therapy and occupational therapy.      Wilmon Arms, PA      V. Charlena Cross, MD  Electronically Signed    KEL/MEDQ  D:  01/21/2008  T:  01/22/2008  Job:  [id number]

## 2011-03-01 NOTE — Discharge Summary (Signed)
[name], [name] NO.:  [id number]   MEDICAL RECORD NO.:  [id number]          PATIENT TYPE:  INP   LOCATION:  2030                         FACILITY:  [facility]   PHYSICIAN:  Kerin Perna, M.D.  DATE OF BIRTH:  July 19, 1941   DATE OF ADMISSION:  12/15/2008  DATE OF DISCHARGE:  12/27/2008                               DISCHARGE SUMMARY   PRIMARY ADMITTING DIAGNOSIS:  Chest pain.   ADDITIONAL/DISCHARGE DIAGNOSES:  1. Severe coronary artery disease.  2. Unstable angina.  3. Subendocardial myocardial infarction.  4. Hypertension.  5. Microcytic anemia secondary to gastrointestinal bleeding.  6. Hyperlipidemia.  7. Chronic back pain.  8. History of leaking abdominal aortic aneurysm status post emergency      stent grafts in March 2009.  9. Postoperative atrial fibrillation.  10.Severe chronic obstructive pulmonary disease.  11.History of tobacco abuse.  12.Moderately large hiatal hernia.  13.Small linear erosions and ulcers along the distal hiatal hernia      compatible with [name] lesion.   PROCEDURES PERFORMED:  1. Cardiac catheterization.  2. Esophagogastroduodenoscopy.  3. Coronary artery bypass grafting x4 (left internal mammary artery to      the left anterior descending, saphenous vein graft to the posterior      descending, saphenous vein graft to the first diagonal, saphenous      vein graft to the ramus intermedius).  4. Endoscopic vein harvest, right leg and left thigh.   HISTORY:  The patient is a 70 year old male who has a known history of  coronary artery disease which has been treated medically since 2007 and  has been relatively stable.  Over the last month, he has had increasing  episodes of chest discomfort, mostly with exertion and relieved with  rest.  He sought care from his primary care physician and was noted to  have a significant microcytic anemia.  Because of his symptoms which had  it suspicious for myocardial ischemia, he was  referred to Dr. Alanda Amass  for further evaluation.  An EKG performed in the [facility] office was positive for right bundle-branch block with right  axis deviation and nonspecific ST changes.  Because of his history of  unstable anginal symptoms and his severe iron deficiency anemia, it was  felt that he should be hospitalized and undergo cardiac catheterization  to further delineate his coronary anatomy.   HOSPITAL COURSE:  [name] was admitted to [facility] on  December 15, 2008 and was started on heparin, nitroglycerin, and a beta-  blocker.  He also required a transfusion of 2 units of packed red blood  cells for hemoglobin of 7.5.  On admission, he was noted to have  elevated cardiac enzymes with a troponin of 0.8.  He was taken to the  cardiac cath lab on December 16, 2008 for cardiac catheterization by Dr.  Alanda Amass.  This showed an ejection fraction of 45% with inferior wall  hypokinesis.  There was a 99% stenosis of the proximal LAD, 95% stenosis  of the proximal RCA, 70% diagonal, and an 80% ramus.  The circumflex had  a 30-50% stenosis and was not felt to be hemodynamically significant.  Based on his coronary anatomy, he was felt to be a poor candidate for  percutaneous intervention.  Cardiac Surgery consultation was obtained  and the patient was seen by Dr. Kathlee Nations Trigt.  Dr. Donata Clay agreed  that he would benefit from coronary artery bypass grafting at this time.  It was felt that a GI workup should be completed prior to surgery to  identify a source of his microcytic anemia and begin treatment.  The  patient was seen by Dr. [name] for GI and he is placed on proton pump  inhibitor therapy and scheduled for an EGD.  This was performed on December 17, 2008 and he was found to have a moderate-to-large hiatal hernia with  some superficial ulcers at the base of the hernia and no active  bleeding.  It was felt that from a GI standpoint, he could proceed  with  surgery.  He also underwent carotid Doppler studies which showed mobile  plaque in the left ICA with no ICA stenosis and normal lower extremity  ABIs.  A CT angiogram revealed the abdominal aortic aneurysm stent to be  in good position.  GI did recommend proceeding with an outpatient  colonoscopy following discharge and they will schedule a followup  appointment.  Prior to surgery, the patient underwent pulmonary function  studies which showed severe obstructive disease.  He did remain stable  during his preoperative workup and had no further episodes of chest  pain.  Dr. Donata Clay reviewed all the study results with the patient and  agreed and explained all risks, benefits, and alternatives of surgery.  The patient agreed to proceed.  He was taken to the operating room on  December 22, 2008 and underwent CABG x4 as described above, performed by Dr.  Donata Clay.  Please see previously dictated operative report for complete  details of surgery.  He tolerated the procedure well and was transferred  to the SICU in stable condition.  He was able to be extubated shortly  after surgery.  He was hemodynamically stable and doing well on postop  day #1.  His chest tubes and lines were removed.  He was kept in the  unit for further observation.  By postop day #2, he was ready for  transfer to the Step-Down Unit.  He did develop a brief episode of  atrial fibrillation on postop day #2 and was started on an amiodarone  drip.  He did convert to normal sinus rhythm and was subsequently  switched to p.o. amiodarone.  His postoperative course has otherwise  been uneventful.  His mobility has been somewhat limited secondary to  his prior history of chronic back pain.  However, he has been working  with Cardiac Rehab Phase 1 and is progressing nicely.  He has been  volume overloaded and was started on Lasix to which he is responding  well.  His anemia has remained stable and he has been continued on iron   supplements.  He has been treated with aggressive pulmonary toilet  measures, inhalers, nebulizer treatments, and mucolytics for his history  of severe COPD.  He remains on 1 L of oxygen but his saturations have  been running in the 95-97% range and it is anticipated his oxygen will  be discontinued today postop day #4.  He has had no episodes of GI  bleeding during the hospital stay.  His incisions are all healing well.  He has remained afebrile and his vital signs have been stable.  His most  recent labs show a hemoglobin of 8.7, hematocrit 26.1, platelets 214,  and white count 11.3.  Sodium 133, potassium 3.6 which has been  replaced, BUN 13, and creatinine 0.81.  He will have repeat labs and PA  and lateral chest x-ray on the morning of December 27, 2008.  It is  anticipated that if he has remained stable, all his labs are stable  and/or improving and he has otherwise had no acute changes, he will be  ready for discharge home.   DISCHARGE MEDICATIONS:  1. Enteric-coated aspirin 325 mg daily.  2. Chantix 2 mg daily.  3. Crestor 5 mg daily.  4. Toprol-XL 25 mg.  5. Protonix 40 mg b.i.d. x8 weeks.  6. Amiodarone 400 mg b.i.d. x 1 week, then 200 mg b.i.d.  7. Lasix 40 mg daily x1 week.  8. Potassium 20 mEq daily x1 week.  9. Advair 250/50 one puff b.i.d.  10.NicoDerm CQ 21 mg patch daily.  11.Tylox 1-2 q.4 h. p.r.n. pain.  12.Ferrous sulfate 325 mg b.i.d.   DISCHARGE INSTRUCTIONS:  He is asked to refrain from driving, heavy  lifting, or strenuous activity.  He may continue ambulating daily and  using his incentive spirometer.  He may shower daily and clean his  incisions with soap and water.  He will continue a low-fat, low-sodium  diet.   DISCHARGE FOLLOWUP:  He will need to see Dr. Zenaida Niece Trigt's physician  assistant on January 19, 2009 with an x-ray.  He is asked to make an  appointment see Dr. Alanda Amass in 2 weeks.  He also has an appointment  with Dr. [name] for GI followup on January 21, 2009 at 1:30 p.m.  In the  interim, if he has problems or questions, our office is to be consulted.  Home Health nurse and physical therapy have been arranged for  restorative care and further home reconditioning.      Coral Ceo, P.A.      Kerin Perna, M.D.  Electronically Signed    GC/MEDQ  D:  12/26/2008  T:  12/26/2008  Job:  [id number]   cc:   Gerlene Burdock A. Alanda Amass, M.D.  Broadus John T. Pamalee Leyden, MD  Petra Kuba, M.D.  [facility] Office

## 2011-03-01 NOTE — Op Note (Signed)
[name], EPPERLY NO.:  [id number]   MEDICAL RECORD NO.:  [id number]          PATIENT TYPE:  INP   LOCATION:  2913                         FACILITY:  [facility]   PHYSICIAN:  Petra Kuba, M.D.    DATE OF BIRTH:  [birth year]   DATE OF PROCEDURE:  DATE OF DISCHARGE:                               OPERATIVE REPORT   PROCEDURE:  Esophagogastroduodenoscopy.   INDICATIONS:  Microcytic anemia and some upper tract symptoms.   Consent was signed after risks and benefits, methods and options  thoroughly discussed yesterday in the hospital and today prior to  sedation.   MEDICINE USED:  Fentanyl 60 mcg and Versed 6 mg.   PROCEDURE IN DETAIL:  The videoendoscope was inserted by direct vision.  His proximal and mid esophagus was normal.  He did have a little  compression at the GE junction, had a moderately large hiatal hernia.  The scope was passed into the antrum through a normal pylorus into a  normal duodenal bulb around the C-looped to normal second portion in the  duodenum.  No blood or signs of sprue seen.  The scope was withdrawn  back to the bulb and a good look there was normal.  The scope was  withdrawn back to the stomach on retroflex.  High in the cardia and the  hiatal hernia was confirmed.  The angularis, lesser and greater curve  were normal on retroflex visualization as was the fundus.  With straight  visualization at the bottom of the hiatal hernia pouch were some obvious  Cameron lesions, i.e., superficial linear ulcers and erosions and a few  small shallow ulcerations.  There was no signs of bleeding.  The area  was washed and could not be made to bleed.  Air was suctioned.  The  scope was slowly withdrawn.  Again a good look at the esophagus was  normal.  The scope was removed.  The patient tolerated the procedure  well.  There was no obvious immediate complication.   ENDOSCOPIC DIAGNOSES:  1. Moderately large hiatal hernia.  2. Small linear  erosions and ulcers along the distal hiatal hernia      compatible with Sheria Lang lesions could not be made to bleed.  3. Otherwise, normal esophagogastroduodenoscopy.   PLAN:  Continue pump inhibitors.  Colonoscopy when okay with Cardiology  and CVTS.  Let me know when he would like for [place] to proceed.  If not  preop then probably 3 to 6 months postop.  In the meantime, we will  resume previous diet and heparin.           ______________________________  Petra Kuba, M.D.     MEM/MEDQ  D:  12/17/2008  T:  12/17/2008  Job:  [id number]

## 2011-03-01 NOTE — Procedures (Signed)
LOWER EXTREMITY ARTERIAL EVALUATION-SINGLE LEVEL   INDICATION:  Bilateral lower extremity groin pain at incision sites.   HISTORY:  Diabetes:  No.  Cardiac:  No.  Hypertension:  No.  Smoking:  A pack per day.  Previous Surgery:  Stent graft for abdominal aortic aneurysm on  01/03/06.   RESTING SYSTOLIC PRESSURES: (ABI)                          RIGHT                LEFT  Brachial:               140                  138  Anterior tibial:        148                  144 (>1.0)  Posterior tibial:       156 (>1.0)           138  Peroneal:  DOPPLER WAVEFORM ANALYSIS:  Anterior tibial:        Biphasic             Triphasic  Posterior tibial:       Triphasic            Biphasic  Peroneal:   PREVIOUS ABI'S:  Date:  RIGHT:  LEFT:   IMPRESSION:  No evidence of significant lower extremity arterial  occlusive disease bilaterally.   ___________________________________________  V. Charlena Cross, MD   MC/MEDQ  D:  04/28/2008  T:  04/28/2008  Job:  [id number]

## 2011-03-01 NOTE — Procedures (Signed)
VASCULAR LAB EXAM   INDICATION:  Followup AAA endograft placed 01/04/2008.   HISTORY:  Diabetes:  No.  Cardiac:  Yes.  Hypertension:  No.   EXAM:  AAA sac size 5.3 cm x 5.4 cm transverse.  Previous sac size by CT  12/22/2009 was 5.3 cm x 5.4 cm transverse.   IMPRESSION:  1. Technically difficult study secondary to body habitus and bowel      gas.  2. The aorta and endograft appear patent.  3. No obvious significant change in size of the aneurysmal sac      surrounding the endograft.  4. Endo leak cannot be ruled out due to bowel gas.  5. Of note, the patient drank 7-Up this morning.   ___________________________________________  V. Charlena Cross, MD   LT/MEDQ  D:  10/25/2010  T:  10/25/2010  Job:  [id number]

## 2011-03-01 NOTE — Assessment & Plan Note (Signed)
OFFICE VISIT   MCADOO, MUZQUIZ  DOB:  [birth year]                                        February 09, 2009  CHART #:  [id number]   CURRENT PROBLEMS:  1. Status post CABG x4 on December 22, 2008, for severe multivessel      coronary artery disease with unstable angina and MI.  2. Severe COPD with bullous emphysema.  3. Hypertension.  4. Anemia with a history of GI bleeding from peptic ulcer disease.   HISTORY OF PRESENT ILLNESS:  The patient is a 70 year old reformed  smoker who returns for his first office visit after multivessel bypass  surgery approximately 6 weeks ago.  He is doing well and walking around  [place] daily.  He has stopped smoking.  He denies any recurrent angina  and the surgical incisions are healing well.  He has been driving and is  anxious to increase his activity level.  He has not been back to see Dr.  Alanda Amass yet.   He remains on his discharge medications including benazepril, iron  sulfate, Crestor, Toprol-XL, Protonix, amiodarone, Advair Diskus, and  pain medication.   PHYSICAL EXAMINATION:  VITAL SIGNS:  Blood pressure is 150/95, pulse 80,  respirations 18, saturation 95% on room air.  GENERAL:  He appears to be doing very well.  LUNGS:  Breath sounds are clear and equal.  The sternum is well healed.  CARDIAC:  Rhythm is regular.  There is no murmur.  EXTREMITIES:  Leg incisions are healed.  There is no peripheral edema.   His PA and lateral chest x-ray shows some COPD, but without active  disease and the sternal wires were all intact.   PLAN:  I have told the patient to reduce his amiodarone to once a day  200 mg and to resume his preoperative of dose of Norvasc 5 mg daily.  Otherwise, he will stay on his current medications.  I have encouraged  him to take a 20-minute walk daily since he will not go to the  outpatient rehab program at the hospital.  I will see him back pain as  needed.  We also provided with some more  hydrocodone pain medication at  this office visit (30 tablets).   Kerin Perna, M.D.  Electronically Signed   PV/MEDQ  D:  02/09/2009  T:  02/10/2009  Job:  [id number]   cc:   Gerlene Burdock A. Alanda Amass, M.D.  Broadus John T. Pamalee Leyden, MD
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
## 2011-03-01 NOTE — Procedures (Signed)
ENDOVASCULAR STENT GRAFT EXAM   INDICATION:  Patient was admitted to the emergency department with a 7  cm abdominal aortic aneurysm and treated with an endostent graft on  01/04/08.   HISTORY:                           DUPLEX EVALUATION   AAA Sac Size:                 5.3 CM AP             6.4 CM TRV  Previous Sac Size:            CM AP                 CM TRV  Evidence of an endoleak?      No                    No   Velocity Criteria:  Proximal Aorta                21 cm/sec  Proximal Stent Graft          20 cm/sec  Main Body Stent Graft-Mid     24 cm/sec  Right Limb-Proximal           80 cm/sec  Right Limb-Distal             73 cm/sec  Left Limb-Proximal            45 cm/sec  Left Limb-Distal              67 cm/sec  Patent Renal Arteries?        Yes                   Yes   IMPRESSION:  1. No evidence of endoleak.  2. Patent stent graft.   ___________________________________________  V. Charlena Cross, MD   MC/MEDQ  D:  04/28/2008  T:  04/28/2008  Job:  [id number]

## 2011-03-01 NOTE — Consult Note (Signed)
[name], [name] NO.:  [id number]   MEDICAL RECORD NO.:  [id number]          PATIENT TYPE:  INP   LOCATION:  2913                         FACILITY:  [facility]   PHYSICIAN:  [name], M.D.    DATE OF BIRTH:  09/21/1941   DATE OF CONSULTATION:  12/16/2008  DATE OF DISCHARGE:                                 CONSULTATION   We were asked to see [name] today in consultation by [name] for anemia.   HISTORY OF PRESENT ILLNESS:  This is a 70 year old male, who was  admitted with unstable angina.  He tells me that he takes 3-4 aspirin a  day as well as 200 mg of ibuprofen 2 pills b.i.d. each day for pain.  He  has also been a 2 to 2-1/2 pack smoker a day for many many years.  He  quit smoking last Saturday.  He reports increased dyspnea on exertion  over the last month.  He also reports right upper quadrant pain that  radiates down his right arm.  He had a change in bowel habits and that  his stools used to brown and very regular, now they have become dark and  hard over the past 3 weeks.  However, he still is having a daily bowel  movement.  He does have occasional heartburn.  He denies abdominal pain.  Denies bright red blood per rectum.  He has never had an endoscopy or  colonoscopy.   PRIMARY CARE PHYSICIAN:  [name] in [facility].   CARDIOLOGIST:  Howard A. Alanda Amass, MD   PAST MEDICAL HISTORY:  Significant for:  1. Abdominal aortic aneurysm.  He is status post repair January 04, 2008.  2. GERD.  3. Hypertension.  4. Hyperlipidemia.  5. Chronic back pain.   SURGERIES:  1. Exploratory laparotomy and appendectomy.  2. Teeth extraction.  3. Arm surgery.   CURRENT MEDICATIONS:  Ibuprofen and aspirin.   ALLERGIES:  He has an allergy to PENICILLIN.   SOCIAL HISTORY:  Positive for 2 packs a day of tobacco.  He tells me he  quit Saturday.  He drinks an occasional beer, which he mixes with V8  juice.   FAMILY  HISTORY:  Negative for colon cancer.  Negative for stomach  ulcers.  His family history seems to be significant for coronary artery  disease and diabetes.   REVIEW OF SYSTEMS:  Positive for cough.  Positive for palpitations when  he has his chest pain.   PHYSICAL EXAMINATION:  GENERAL:  He is alert and oriented, in no  apparent distress.  VITAL SIGNS:  His pulse is 81, respirations 17, and blood pressure is  136/90.  He is back from his cardiac cath this morning.  HEART:  Regular rate and rhythm.  LUNGS:  Clear.  ABDOMEN:  Obese, nontender, and nondistended with good bowel sounds.   LABORATORY DATA:  Labs show a troponin of 0.88, this is down from 1.05.  BMET is within normal limits.  Hemoglobin is 9.5 that is status post  transfusion of 2  units of packed red blood cells, hematocrit 29.0, white  count 10.2, and platelets 278,000.  His hemoglobin yesterday was 7.5  with an MCV value of 76.6.  Baseline hemoglobin appears to be about 10.  Radiological exams include a chest x-ray done on December 15, 2008, that  shows COPD and a moderate hiatal hernia.  He also had a CT of his chest  that showed large hiatal hernia and paraseptal emphysema.  A year ago in  2009, he had a CT of his abdomen and pelvis that showed no acute  findings.  He had a cardiac cath today that showed:  1. Severe three-vessel disease.  2. Left ventricular dysfunction with an EF of 40-45%.  CVTS consult      has been called for coronary artery bypass grafting.   ASSESSMENT:  Dr. Vida [name] has seen and examined the patient, collected  history, and reviewed his chart.  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   PLAN:  I spoke with Dr. Alanda [name] and all he feels is that the patient  is too risky right now  for a colonoscopy.  He does believe the patient  would be fine with an endoscopy.  We will plan for upper endoscopy  probably tomorrow as well as colonoscopy when the patient is able to  tolerate the prep and procedure, agree with b.i.d. PPI.      Howard Police, PA    ______________________________  [name], M.D.    MLY/MEDQ  D:  12/16/2008  T:  12/17/2008  Job:  [id number]   cc:   [name], M.D.  [name], M.D.  Howard Laroche, MD

## 2011-03-01 NOTE — Assessment & Plan Note (Signed)
OFFICE VISIT   KOLT, MCWHIRTER  DOB:  06-20-1941                                       10/25/2010  ZOXWR#:[id number]   The patient comes back in today for followup of his aneurysm which  became symptomatic in March of 2009.  It was repaired with a Cook Zenith  device.  He required embolization of his right hypogastric artery.  His  postoperative course was complicated by prolonged intubation secondary  to his COPD.  He complains of lack of energy today, otherwise is without  complaints.  Negative for abdominal pain, bloody stool, fevers or chills  or diarrhea.   PHYSICAL EXAMINATION:  Vital signs:  His heart rate is 80, blood  pressure 180/81, temperature is 97.9.  General:  He is well-appearing,  in no distress.  Abdomen:  Soft, nontender.  Well-healed inguinal  incisions.  Extremities:  Warm and well-perfused.   Ultrasound of his aneurysm was performed today.  His aneurysm has been  stable in size but could not evaluate him for endo leak secondary to  body habitus and bowel gas.   ASSESSMENT AND PLAN:  Status post aneurysm repair.  I will reevaluate  the patient with ultrasound in 6 months.  If we do not get a good  evaluation of his aneurysm and if it has not decreased in size I may  need to reconsider a CT scan.  However, overall I think he is doing very  well.  He is followed by Dr. Alanda Amass.  I have looked through my  records.  I do not have a copy of his carotid ultrasound.  I was going  to order them today.  However, I imagine this has been performed by Dr.  Alanda Amass.  Therefore, I will request records to see what his carotid  arteries look like.     Jorge Ny, MD  Electronically Signed   VWB/MEDQ  D:  10/25/2010  T:  10/26/2010  Job:  [id number]   cc:   Gerlene Burdock A. Alanda Amass, M.D.

## 2011-03-01 NOTE — Op Note (Signed)
[name], [name]              ACCOUNT NO.:  [id number]   MEDICAL RECORD NO.:  [id number]          PATIENT TYPE:  INP   LOCATION:  2550                         FACILITY:  [facility]   PHYSICIAN:  [name], MDDATE OF BIRTH:  Jun 18, 1941   DATE OF PROCEDURE:  01/04/2008  DATE OF DISCHARGE:                               OPERATIVE REPORT   PREOPERATIVE DIAGNOSIS:  Symptomatic abdominal aortic aneurysm.   POSTOPERATIVE DIAGNOSIS:  Symptomatic abdominal aortic aneurysm.   PROCEDURES PERFORMED:  1. Bilateral common femoral artery exposure.  2. First-order catheterization.  3. Pelvic angiogram.  4. Coil embolization, right hypogastric artery.  5. Catheter in aorta x2.  6. Abdominal aortogram.  7. Endovascular repair of abdominal aortic aneurysm.  8. Distal extension.   TYPE OF ANESTHESIA:  General.   BLOOD LOSS:  200 cc.   FINDINGS:  Complete exclusion, late type II leak.   COMPLICATIONS:  None.   DEVICES USED:  1. Main body is a Tax inspector bifurcated device 32 x 140 lot      [id number].  2. Right leg is a Cook Zenith TFLE 12 x 107 Flex lot [id number].  3. Distal right extension is a TFLE 12 x 54 lot #[id number].  4. Left leg is a Cook Zenith TFLE 18 x 54 lot [id number].   INDICATIONS FOR PROCEDURE:  This is a 70 year old gentleman who  presented to the emergency department with symptomatic abdominal aortic  aneurysm.  A CT scan confirmed, he was a candidate for endovascular  repair.  Risks and benefits were discussed with the family.  He was  taken to the operating room emergently.   PROCEDURE:  The patient was identified in the holding area and taken to  room #9.  He was placed supine on the table.  General endotracheal  anesthesia was administered.  The patient was prepped and draped in  standard sterile fashion.  Time-out was called and antibiotics were  administered.  The inguinal ligaments were identified by bony landmarks.  Oblique incisions were made below the  inguinal ligament and above the  pulse of the femoral artery.  Bovie cautery and blunt dissection were  used to identify the femoral sheath.  Femoral sheath was then opened  sharply.  Bilateral common femoral arteries were then circumferentially  exposed and encircled with vessel loops.  At this point in time, the  patient was given systemic heparinization.  The 18-gauge needles were  used to access bilateral femoral arteries.  A 0.035 wires were advanced  into the aorta under fluoroscopic visualization.  An 8-French sheath was  placed in the right groin and a 7 x 45 cm Terumo sheath was placed in  the left groin.  Using an Omniflush catheter and Bentson wire from the  left leg, aortic bifurcation was crossed.  Wire was placed into the  right iliac arterial system.  The dilator was replaced in the Terumo  sheath and the Terumo sheath was placed into the right common iliac  artery.  Using a Kumpe catheter and Bentson wire, the right hypogastric  artery was selected.  A contrast injection  was performed to confirm  successful cannulation of right hypogastric artery.  Coil embolization  was then performed using 12 mm and 10 mm Nester coils.  One followup  angiogram was performed which revealed good placement of the coils.   Next, attention was turned towards the aneurysm repair.  The 0.035 stiff  wires were then placed into the descending thoracic aorta under  fluoroscopic visualization.  Main body device was selected and advanced  up the left leg.  This was a Marine scientist 32 x 140 device.  It was placed  at the level of renal arteries.  Through the right leg, an Omniflush  catheter was placed  at the level of L1 and multiple abdominal  aortograms were obtained with the appropriate orientation such that the  graft was placed just below the left infrarenal artery which is the  lowest renal artery.  The contralateral vein was then exposed.  Additional angiogram was performed to make sure the  graft was in good  position and then the top cap was released.  Using a Kumpe catheter and  Glidewire, the contralateral vein was cannulated.  An Omniflush catheter  was able to be freely rotated within the main body portion of device  confirming successful cannulation.  An Amplatz Super Stiff wire was then  placed.  I had previously placed an 18-French sheath up the right leg  because I was concerned that one of the coils in the hypogastric artery  may actually slip into the external and therefore, it was at this point  that a pelvic angiogram was obtained in the LAO oblique to determine the  distance of the right extension.  I first placed a Cook Zenith TFLE 12 x  107 Flex device with a 1.5 cm overlap.  This landed just distal to the  coiling in the hypogastric artery.  I felt I needed more coverage,  therefore, I placed an additional 12 x 54 device.   Next, attention was turned towards the ipsilateral limb.  The remaining  stent was deployed.  The top cap was retrieved and the device was  removed.  An 18-French sheath was placed.  The image intensifier was  brought down into an RAO oblique and a pelvic angiogram was obtained to  define the location of the left hypogastric artery.  The left limb was  an 18 x 54 device.  This was deployed just proximal to the left  hypogastric artery.  Next, a coated balloon was used to mold the left  limb of the graft.  A 10 x 4 balloon was used to mold the right limb of  the graft.  Final injection was then performed.  This reveals widely  patent renal arteries bilaterally.  There was a late type II endoleak.  The right hypogastric artery remains patent.  There was no evidence of a  type I or type III leak.   After the above results were obtained, sheaths and wires were removed.  Henley clamps were placed proximally on the femoral arteries and  peripheral DeBakeys were placed distally.  Femoral arteries were  repaired using a running 5-0 Prolene  suture.  Prior to completion, the  arteries were flushed in the antegrade and retrograde fashion.  Wounds  were then irrigated.  Protamine 50 mg was given.  The femoral sheath was  then closed with running 2-0 Vicryl.  The subcutaneous tissue was closed  in 2 additional layers of 2-0 Vicryl as well as a layer of 3-0  Vicryl  and a full layer of 4-0 Vicryl on the skin.  Dermabond was then placed.  The patient had Doppler signals in his posterior tibial arteries at the  end of the procedure.  There were no complications.  He was taken to  recovery room in stable condition.           ______________________________  V. Charlena Cross, MD  Electronically Signed     VWB/MEDQ  D:  01/04/2008  T:  01/05/2008  Job:  [id number]

## 2011-03-01 NOTE — Op Note (Signed)
NAMEACHILLE, [name] NO.:  [id number]   MEDICAL RECORD NO.:  [id number]          PATIENT TYPE:  INP   LOCATION:  2314                         FACILITY:  [facility]   PHYSICIAN:  Kerin Perna, M.D.  DATE OF BIRTH:  Jul 07, 1941   DATE OF PROCEDURE:  DATE OF DISCHARGE:                               OPERATIVE REPORT   OPERATION:  1. Coronary artery bypass grafting x4 (left internal mammary artery to      LAD, saphenous vein graft to posterior descending, saphenous vein      graft to first diagonal, saphenous vein graft to ramus      intermediate).  2. Endoscopic harvest of bilateral upper leg greater saphenous vein.   SURGEON:  Kerin Perna, MD   ASSISTANT:  Coral Ceo, PA-C   ANESTHESIA:  General by Dr. 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   POSTOPERATIVE DIAGNOSIS:  Severe multivessel coronary artery disease  with unstable angina and subendocardial myocardial infarction.   INDICATIONS:  The patient is a 70 year old hypertensive smoker who was  recently diagnosed with unstable angina and positive cardiac enzymes.  Cardiac catheterization demonstrated EF of 45% with inferior wall  hypokinesia.  He had a 99% stenosis of the LAD proximally and a 95%  stenosis of the proximal RCA with a 70% stenosis of the diagonal and a  80% stenosis of the ramus.  This circumflex had a 30-50% stenosis and  was not felt to be hemodynamically significant.  Based on this coronary  anatomy and clinical course, he is felt to be a candidate for surgical  revascularization as the anatomy of his coronary artery disease was not  conducive to percutaneous intervention.   Prior to surgery, I examined the patient in his CCU room on several  occasions and reviewed the results of cardiac catheterization and his  echo.  The patient also had other significant medical  problems at the  time of his presentation including anemia and clinical GI bleeding.  An  upper endoscopy demonstrated non-bleeding ulcers and a hiatal hernia and  he was placed on proton pump inhibitors.  He also had severe COPD with  active smoking and was placed on a pulmonary tune-up prior to surgery.  I reviewed the benefits, details, and alternatives of surgery with Mr.  Lusby.  I discussed with him the risks of bleeding, blood transfusion  requirement, infection, prolonged ventilator dependence due to COPD,  stroke, and death.  After reviewing all these issues, he demonstrates  his understanding and agree to proceed with surgery under what I felt  was an informed consent.   OPERATIVE FINDINGS:  1. 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  2. Cardiomegaly with mildly elevated right-sided pressures.  3. Severely intramyocardial location of the left coronary vessels      requiring dissection of the myocardium.  4. Intraoperative anemia, hemoglobin 7.8, requiring packed cell blood      transfusion x2.  5. The  circumflex was not significantly diseased, however, it was not      identified due to its intramyocardial location and the severe      cardiomegaly and difficulty exposing the AV groove.  The circumflex      should not be considered a vessel for a potential redo CABG.   PROCEDURE:  The patient was brought to the operative room and placed  supine on the operating table.  General anesthesia was induced.  The  chest, abdomen, and legs were prepped with Betadine and draped as a  sterile field.  A sternal incision was made and the saphenous vein was  harvested endoscopically from both legs.  There was realistically no  good vein left except perhaps the left mid to lower leg.  Internal  mammary artery, however, was a good vessel with excellent flow.  The  sternal retractors were placed and the pericardium was opened and  suspended.   After the vein was harvested and found to be adequate, the  patient was heparinized and purse-strings were placed in the ascending  aorta and right atrium and the patient was cannulated and placed on  bypass.  The coronaries were identified for grafting and the mammary  artery and vein grafts were prepared for the distal anastomosis.  Cardioplegia catheters were placed on both antegrade and retrograde cold  blood cardioplegia.  The patient was cooled to 32 degrees and aortic  crossclamp was applied and cardioplegic arrest was established through  the 2 cardioplegia catheters.  Cardioplegia was then delivered every 20  minutes or less while the crossclamp was in place.   The distal coronary anastomoses were then performed.  The first distal  anastomosis was the posterior descending branch of the right.  This was  a 1.5-mm vessel with a thickened wall.  It had a proximal 95% stenosis.  The reverse saphenous vein was sewn end to side with running 7-0 Prolene  with good flow through the graft.  The second distal anastomosis was the  ramus intermediate.  There was a 1.5-mm vessel with proximal 70%  stenosis and the reverse saphenous vein was sewn end to side with  running 7-0 Prolene with good flow through the graft.  The third distal  anastomosis was the diagonal branch of the LAD which was a small 1.2-mm  vessel with a proximal calcified 80% stenosis.  The reverse saphenous  vein was small in caliber and was sewn end to side with running 7-0  Prolene with good flow through the graft.  Cardioplegia was redosed.  The fourth distal anastomosis was the distal third of the LAD.  It was  deeply intramyocardial and dissected out.  It was a 1.5-mm millimeter  vessel with a proximal 99% stenosis.  The left IMA pedicle was brought  to an opening and the left lateral pericardium was brought down onto the  LAD and sewn end to side with a running 8-0 Prolene.  There was a good  flow through the  anastomosis after briefly releasing the pedicle bulldog  on the mammary artery.  The bulldog was reapplied and the pedicle was  secured at the epicardium.  Cardioplegia was redosed.   With the crossclamp was still in place, 2 proximal vein anastomoses were  performed on the ascending aorta.  The vein to the diagonal was too  small and thin to sew the thickened aorta, so it was sewn to the  proximal aspect of the circumflex graft to make a Y-graft.  The 4.0-mm  punches and running 7-0 Prolene were used to create 2 proximal  anastomosis.  After the proximal had been established, air was vented  from the coronaries with a dose of retrograde warm blood cardioplegia  and the crossclamp was removed.   I resumed a spontaneous rhythm.  Cardioplegia catheter was removed and  air was aspirated from the vein grafts.  The proximal and distal  anastomoses were checked and found to be hemostatic.  Temporary pacing  wire was applied and the patient was rewarmed to 37 degrees.  The lungs  were carefully reexpanded and the ventilator was resumed.  The patient  was weaned from bypass without difficulty on renal-dose dopamine with  stable blood pressure and good cardiac output.  Protamine was  administered without adverse reaction and the cannulas were removed.  The mediastinum was irrigated with warm antibiotic irrigation.  The leg  incision was irrigated and closed in a standard fashion.  The superior  pericardial fat was closed over the aorta.  Two mediastinal and a left  pleural chest tube were  placed and brought out through separate incisions.  The sternum was  closed with interrupted steel wire.  The pectoralis fascia was closed  with a running #1 Vicryl.  This subcutaneous and skin layers were closed  with running Vicryl and sterile dressings were applied.  Total bypass  time was 180 minutes.      Kerin Perna, M.D.  Electronically Signed     PV/MEDQ  D:  12/22/2008  T:  12/23/2008   Job:  [id number]   cc:   Ritta Slot, MD

## 2011-03-01 NOTE — Assessment & Plan Note (Signed)
OFFICE VISIT   [name], [name]  DOB:  02/21/1941                                       08/31/2009  NWGNF#:[id number]   REASON FOR VISIT:  Follow-up aneurysm repair.   CHIEF COMPLAINT:  Bilateral hip pain, blurry vision.   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  He was taken to the operating room and  underwent endovascular exclusion with coil embolization of his right  hypogastric artery.  Postoperatively had a prolonged course secondary to  his pulmonary debilitation requiring prolonged intubation.  He was  ultimately discharged from the hospital.  He has been seen several times  in follow-up.  His aneurysm is getting smaller in size and there has  been no evidence of endoleak.  Since his repair he has undergone  coronary artery bypass graft by Dr. Donata Clay.  He has also been able to  quit smoking, he has had some weight gain associated with stopping of  cigarettes.  Patient is on multiple medications for hypertension which  is moderately well controlled.  His COPD has been improved by the  addition of Spiriva.   PAST MEDICAL HISTORY:  Is positive for hypertension,  hypercholesterolemia, previous heart attack, status post coronary artery  bypass graft in March of 2010, abdominal aneurysm status post  endovascular aneurysm repair in March of 2009.   SOCIAL HISTORY:  The patient is a nonsmoker as of March 2010.  He does  not drink alcohol.   REVIEW OF SYSTEMS:  Is positive for weight gain, peptic ulcer disease,  reflux, hiatal hernia, frequent urination, hip pain, dizziness,  depression and anxiety, rash in the inside of his thighs, arms and  stomach.   PHYSICAL EXAMINATION:  Heart rate 94, blood pressure 120/87 and  temperature 97.9.  General:  He is well-appearing, in no distress.  HEENT:  Normocephalic, atraumatic.   Pupils equal.  Sclerae anicteric.  Neck:  Supple.  No JVD.  No carotid bruits.  Cardiovascular:  Regular  rate and rhythm, respirations nonlabored.  Abdomen:  Soft, nontender.  No pulsatile mass.  Extremities are warm and well perfused.  Neurologically is without focal deficit.   DIAGNOSTIC STUDIES:  I have independently reviewed his abdominal  ultrasound which shows no evidence of endoleak.  His aneurysm measures  5.8 x 6.2 compared to previous size of 5.3 x 6.4.  Ankle brachial  indices do suggest mild disease, on the right his ABI is 0.86 and on the  left is 0.99.   ASSESSMENT/PLAN:  Status post endovascular repair of symptomatic  aneurysm.   PLAN:  1. Aneurysm:  I will see the patient back in 6 months with repeat CT      angiogram.  2. Hip pain.  I think this most likely is representative of arthritis      or degenerative disease, I do not think it is related to his      aneurysm repair.  I told him to see his primary care physician      regarding this.  3. Blurry vision:  The patient had a carotid duplex prior to his CABG,      which shows mild disease.  There was a questionable dissection.      However,  I think with bilateral disease this is most likely not      vascular in etiology, but most likely related to his cataracts.      Again he is going to see his primary care physician next week with      regards to this.   Howard Ny, MD  Electronically Signed   VWB/MEDQ  D:  08/31/2009  T:  09/01/2009  Job:  [id number]   cc:   Dr. Tomi Bamberger  Dr. Alanda Amass  Dr. Donata Clay

## 2011-03-01 NOTE — Assessment & Plan Note (Signed)
OFFICE VISIT   [name], [name]  DOB:  [birth year]                                       03/01/2010  [id number]#:[id number]   REASON FOR VISIT:  Follow up aneurysm.   HISTORY:  This is a 70 year old gentleman who presented to the emergency  department in March 2009 with a symptomatic abdominal aortic aneurysm  measuring greater than 7 cm in size.  He underwent endovascular repair  which required co-embolization of his right hypogastric artery.  He had  a prolonged course secondary to his COPD, including prolonged  intubation.  He was recently hospitalized for anemia requiring blood  transfusions, etiology of which was unclear.  He has a history of ulcer  disease.  He underwent an EGD as well as colonoscopy, which were  unremarkable.  He continues to be without etiology for his anemia.  His  COPD is also continuing to affect him from a pulmonary prospective.  He  has quit smoking.  He did have a significant amount of weight gain, up  to 270 pounds.  He has recently lost some weight.   REVIEW OF SYSTEMS:  Positive for shortness of breath, lightheadedness,  black stools, abdominal pain.  All other review of systems are negative,  as documented in the encounter form.   PHYSICAL EXAMINATION:  Heart rate 107, blood pressure 117/80, O2 sats  are 98%.  Weight is 264.  General:  He is in no acute distress.  HEENT:  Within normal limits.  Lungs are clear bilaterally.  No wheezes.  Cardiovascular:  Regular rate and rhythm.  Abdomen is obese.  No  pulsatile mass.  It is soft.  Extremities are warm and well-perfused.  No ulceration.  Neuro:  No focal deficits.  Skin without rash.   DIAGNOSTIC STUDIES:  I have reviewed his CT scan, which was performed on  12/22/2009.  There is no evidence of endoleak.  His aneurysm is  shrinking in size.   ASSESSMENT:  Abdominal aortic aneurysm.   PLAN:  The patient's CT scan reveals that his stent graft is in good  position.  There is  no evidence of endoleak.  The aneurysm sac is  decreasing in size.  The patient has been worked up for melena with  undetermined etiology.  He has had an EGD and endoscopy based on those  findings as well as his CT scan.  I do not think that there is any  evidence of being related to his aortic graft.  I plan on seeing the  patient back in 6 months with an ultrasound.     Jorge Ny, MD  Electronically Signed   VWB/MEDQ  D:  03/01/2010  T:  03/02/2010  Job:  [id number] [id number]

## 2011-03-01 NOTE — Assessment & Plan Note (Signed)
OFFICE VISIT   [name], [name]  DOB:  28-Aug-1941                                        January 19, 2009  CHART #:  [id number]   The patient comes in today for a postoperative followup.  He is status  post coronary artery bypass grafting x4 on December 22, 2008.  His  postoperative course was notable for atrial fibrillation, which  converted to normal sinus rhythm on amiodarone.  Also, his hospital  course was notable for GI bleeding and microcytic anemia.  He was able  to be discharged home on December 28, 2008, in good condition.  Since his  discharge home, he has continued to have some trouble with dark stools  as well as abdominal discomfort and occasional constipation.  He does  have an appointment to see Dr. Ewing Schlein this week.  He has been taking  Protonix, which has been prescribed for 8 weeks.  He has also had been  driving despite being instructed prior to discharge to refrain from  driving.  He has not seen his cardiologist and states that he was not  aware of the need to follow up with Dr. Alanda Amass.  In addition, he had  been having sleep difficulties and had been doubling up on his Ambien to  help with sleep.  When his supply ran low, he contacted our office and  was made aware that he was taking the medication incorrectly.  He then  was able to obtain an additional #14 day supply of Ambien from Dr.  Kandis Cocking office.  He has finished with the Lasix and potassium, and  feels that his swelling is much better.  He has not noted any changes in  his weight.  His breathing has been stable.  He states that he has not  been smoking, but has been taking the Chantix as directed.   PHYSICAL EXAMINATION:  VITAL SIGNS:  Blood pressure is 138/96, pulse is  78, respirations 18, and O2 sat 96% on room air.  SKIN:  His sternal and lower extremity EVH harvest sites have healed  well.  He does have a 2 x 2 cm distal EVH site which appears to be  hematoma or fluid  collection.  It is nontender, nonerythematous, and  there is no drainage from the site.  CHEST:  The sternum is stable to palpation.  HEART:  Regular rate and rhythm without murmurs, rubs, or gallops.  LUNGS:  Clear.  EXTREMITIES:  He has no significant lower extremity edema otherwise.   Chest x-ray is stable with resolving atelectasis and effusions.   ASSESSMENT/PLAN:  The patient is progressing well, status post coronary  artery bypass graft.  I have discussed his medications at length with  the patient and his wife.  He understands the importance of followup  with GI as directed this week.  He will also continue his Protonix as  directed until seen by Dr. Ewing Schlein.  I reassured him that his dark stools  could be secondary to the ongoing iron replacement, but again this will  be further delineated once he sees GI.  I have also instructed him to  make an appointment as soon as possible to see Dr. Alanda Amass for medical  management.  Hopefully he can come off his amiodarone since he appears  to be maintaining normal sinus rhythm.  I would like to have him return  to our office in 2-3 weeks for a recheck if his endovein harvest site  hematoma/fluid collection.  I also feel he need some followup just to  make sure that he is probably adhering to his medication regimen.  Therefore, we have made him an appointment to see Dr. Donata Clay in 3  weeks.  He may call sooner if he has any other problems or questions  arise.   Kerin Perna, M.D.  Electronically Signed   GC/MEDQ  D:  01/19/2009  T:  01/20/2009  Job:  [id number]   cc:   Petra Kuba, M.D.  Richard A. Alanda Amass, M.D.  Broadus John T. Pamalee Leyden, MD

## 2011-03-01 NOTE — Procedures (Signed)
ENDOVASCULAR STENT GRAFT EXAM   INDICATION:  Followup of abdominal aortic aneurysm repair.   HISTORY:  Endovascular stent, March, 2009.                           DUPLEX EVALUATION   AAA Sac Size:                 5.8 CM AP             6.02 CM TRV  Previous Sac Size:            5.3 CM AP             6.4 CM TRV  Evidence of an endoleak?      No                    No   Velocity Criteria:  Proximal Aorta                Not visualized cm/sec  Proximal Stent Graft          Not visualized cm/sec  Main Body Stent Graft-Mid     18 cm/sec  Right Limb-Proximal           26 cm/sec  Right Limb-Distal             Not visualized cm/sec  Left Limb-Proximal            18 cm/sec  Left Limb-Distal              Not visualized cm/sec  Patent Renal Arteries?        Yes                   Yes   IMPRESSION:  1. Patent endovascular repair of abdominal aortic aneurysm with no      evidence of leak, based on limited visualization due to body      habitus and bowel gas pattern.  2. Slight increase in diameter measurement of the endograft.  3. Right ankle brachial index suggests mild disease.  4. Left ankle brachial index is within normal limits.        ___________________________________________  V. Charlena Cross, MD   CJ/MEDQ  D:  08/31/2009  T:  09/01/2009  Job:  [id number]

## 2011-03-01 NOTE — H&P (Signed)
[name], [name]              ACCOUNT NO.:  [id number]   MEDICAL RECORD NO.:  [id number]          PATIENT TYPE:  INP   LOCATION:  2550                         FACILITY:  [facility]   PHYSICIAN:  Juleen China IV, MDDATE OF BIRTH:  March 18, 1941   DATE OF ADMISSION:  01/03/2008  DATE OF DISCHARGE:                              HISTORY & PHYSICAL   CONSULTING SERVICES:  The Emergency Department.   REASON FOR ADMISSION:  Abdominal aortic aneurysm.   HISTORY:  This is a 70 year old gentleman who presented to the emergency  department at 5 p.m. on January 03, 2008, for a left flank pain which  radiated to his scrotum and his back.  This was associated with nausea,  but no vomiting.  There were no relieving or aggravating factors.  The  patient underwent a CT scan which revealed a 7-cm abdominal aortic  aneurysm.  The patient's risk factors include 2-1/2-pack-a-day smoking  history as well as hypertension and hypercholesterolemia.  He reports no  recent chest pain.  He has had a cardiac catheterization approximately 1  year ago where no intervention was performed.   REVIEW OF SYSTEMS:  GENERAL:  Positive for nausea, negative for  vomiting.  No fevers, no chills.  GI:  His abdomen is positive for  abdominal pain.  GU:  Pain in his left scrotum.  NEURO:  Negative.  ORTHO:  Negative.  HEME:  Negative.  All other systems are negative.   PAST MEDICAL HISTORY:  Positive for:  1. Gastroesophageal reflux disease.  2. Hypertension.  3. Hypercholesterolemia  4. Chronic back pain.   PAST SURGICAL HISTORY:  1. Cardiac catheterization.  2. Exploratory laparotomy with appendectomy.  3. Arm surgery.  4. Teeth extraction.   FAMILY HISTORY:  Positive for coronary artery disease in his mother.  His father died of Hilda Blades disease.   SOCIAL HISTORY:  A 2-1/2-pack-a-day smoking and occasional alcohol.   MEDICATIONS:  Only include aspirin at this time.   ALLERGIES:  PENICILLIN WHICH CAUSES  SHORTNESS OF BREATH AND HIVES.   PHYSICAL EXAMINATION:  Temperature is 97.5, pulse 102, respirations 18,  and blood pressure is 133/86.  GENERAL:  This is a well-developed, well-nourished male in mild  distress.  HEENT:  Normocephalic, atraumatic.  Pupils are equal.  Sclerae are  anicteric.  NECK:  Supple.  There is no JVD.  CARDIOVASCULAR:  Regular, rate, and rhythm.  No murmurs.  PULMONARY:  Lungs are clear bilaterally.  ABDOMEN:  Mildly tender to deep palpation.  No hepatosplenomegaly.  EXTREMITIES:  Warm and well perfused, palpable pedal pulses.  NEURO:  Cranial nerves II through XII are grossly intact.  SKIN:  Without rash.  PSYCH:  He is alert and oriented x3.   DIAGNOSTIC STUDIES:  The patient underwent a CT angiogram which shows a  large infrarenal abdominal aortic aneurysm, maximum diameter is 7.6 cm  without evidence of rupture.   ASSESSMENT AND PLAN:  Symptomatic abdominal aortic aneurysm.   PLAN:  The patient will be taken to the operating room for emergent  endovascular aneurysm repair.  The risks and benefits were discussed  with the patient and the family which included risk of bowel ischemia,  leg ischemia, rupture, bleeding, cardiac problems, and pulmonary  complications.  The patient and his wife understand these risks.  We  will proceed to the operating room for an emergent stent-graft repair.           ______________________________  V. Charlena Cross, MD  Electronically Signed     VWB/MEDQ  D:  01/04/2008  T:  01/04/2008  Job:  [id number]

## 2011-03-01 NOTE — Assessment & Plan Note (Signed)
OFFICE VISIT   [name], [name]  DOB:  Jun 12, 1941                                       02/04/2008  ZOXWR#:[id number]   REASON FOR VISIT:  Followup.   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  He was taken to the operating room urgently for  endovascular exclusion of his aneurysm.  This was done with a [employer] device.  He also underwent coil embolization of his right  hypogastric artery.  His postoperative course was complicated by  prolonged intubation due to his pulmonary status as well as agitation.  Ultimately, he was able to be weaned off the vent and extubated, and  ultimately sent home.  He comes in today for followup.  This is his 1  month followup.  He denies having any abdominal pain.  He is complaining  of back pain which has been chronic.  He has no abdominal pain.  He is  very fatigued after full activity.   PHYSICAL EXAMINATION:  His blood pressure 169/113, pulse 73,  respirations 18.  General:  Well-appearing in no acute distress.  Abdomen is soft, nontender.  His groin incisions are well-healed.   DIAGNOSTIC STUDIES:  CT scan was performed today.  There is no evidence  of endoleak.  The aneurysm sac is same in size.   ASSESSMENT AND PLAN:  Status post endovascular aneurysm for symptomatic  disease.  The patient's 1 month CT scan does not show endoleak.  The  patient will have scheduled follow up with ultrasound in 3 months.  Will  see me following his ultrasound.  I have given him a prescription for  Percocet 30 tablets to help with his back pain as he has not been able  to get a primary care physician to evaluate him for this.  I have  recommended he be seen by [facility].  We will try to schedule  this visit.  He is also on Seroquel at the time of his ICU stay and I am  tapering it over the course  of the next 8 days.  Again, he will follow  up with me in 3 months with ultrasound at that point time.   Jorge Ny, MD  Electronically Signed   VWB/MEDQ  D:  02/04/2008  T:  02/05/2008  Job:  [id number]

## 2011-03-01 NOTE — Cardiovascular Report (Signed)
[name], [name]              ACCOUNT NO.:  [id number]   MEDICAL RECORD NO.:  [id number]          PATIENT TYPE:  INP   LOCATION:  2913                         FACILITY:  [facility]   PHYSICIAN:  Richard A. Alanda Amass, M.D.DATE OF BIRTH:  Jan 13, 1941   DATE OF PROCEDURE:  DATE OF DISCHARGE:                            CARDIAC CATHETERIZATION   PROCEDURE:  Retrograde central aortic catheterization via LCFA approach  through previous implanted endoluminal graft, selective coronary  angiography by Judkins technique, left ventricular angiogram in right  anterior oblique, left anterior oblique projection, subselective left  internal mammary artery, right internal mammary artery, abdominal aortic  angiogram midstream PA projection.   PROCEDURE IN DETAIL:  The patient was brought to the second floor CP Lab  in a postabsorptive state after premedication with 5 mg of Valium p.o.  Informed consent was obtained to proceed.  The patient had normal renal  function preoperatively and heparin was held on call to the laboratory.  Xylocaine 1% was used for local anesthesia and during the procedure the  patient had intermittent IV Morphine for predominantly back pain relief,  a total of 7 mg IV.  Because of the patient's previous EVAR  (endovascular aneurysm resection) with ELG (endoluminal graft),  fluoroscopy showed long right iliac extension and a short left common  iliac cuff.  There was a right angle at the right iliac main body and  straighter root through the left iliac system, so the left-sided  approach was chosen.  There was previous bilateral groin surgery from  his ELG.  Using a Doppler flow directed 18-gauge needle, the left common  femoral artery was located with this and under fluoroscopic control, and  with an anterior puncture entered.  Initially a 0.035-inch J-tip Teflon-  coated guidewire was used and we are able to negotiate into the left  common iliac limb of the graft with this, but  unable to pass a 5-French  catheter through the previously placed short 5-French sheath.  For this  reason, we switched to a right coronary catheter and a Amplatz stiff  0.035 inch guidewire.  The guidewire was positioned through the graft  and using this approach, we were able to pass the 5-French right  coronary catheter.  Amplatz stiff guidewire exchange was then used with  graft procedure for catheter exchanges, and with pigtail exchange, a  long stiff Amplatz wire was used because of longer length of the  catheter.  Selective coronary angiography was done with 5-French 4-cm  taper Cordis coronary catheter and left coronary with a 4-cm taper 5-  French left Cordis catheter.  LV angiogram was done in the RAO and LAO  projection at 25 mL, 14 mL per second and 20 mL, 12 mL per second  through a 5-French pigtail catheter using Medrad diastolic injector.  Prior to this, subselective LIMA and RMA was done with the right  coronary catheter.  The pullback pressure to CA was performed showed no  gradient across the aortic valve.  Catheter was positioned above the  mesenteric access and abdominal angiogram was done in the PA projection  at 25 mL,  20 mL/second.  Because of decreased flow volume, another  injection was done in the mid graft to better visualize the right and  left iliac limbs.  Using the guidewire, the catheters was removed.  Side-  arm sheath was flushed.  The patient tolerated the procedure well in the  laboratory.  The side-arm sheath was removed and pressure placed over  the left groin to control hemostasis.  ACT was 139.  The patient  tolerated the procedure well.  He did not have any chest pain or  arhythmia, remained in sinus rhythm.   PRESSURES:  LV:  133/8; LVEDP 20-22 mmHg.   CA:  133/84 mmHg.   There was no gradient across the aortic valve on catheter pullback.   LV angiogram demonstrated hypokinesis of the mid anterolateral wall,  basilar inferior wall and  hypo-akinesis of the inferoapical wall.  In  LAO projection, there was hypo-akinesis of the distal septum and  posterior apical wall.  Estimated EF was approximately 40-45% with no  significant mitral regurgitation.   Abdominal aortic angiogram demonstrated widely patent celiac and SMA  axis.  The suprarenal fixation of the previously placed [facility]  bifurcated 32 x1, 40 graft.  There was a right iliac extension present  and a left iliac cuff present.  There was repeated injections.  There  was good flow throughout the graft and there was no evidence of  endoleak.  There was no significant evidence of graft stenosis or  kinking present.  The distal external iliacs appeared intact and there  was visualization of right SFA profunda junction was intact and faint  visualization of left SFA profunda junction which was intact.   Both renal arteries were single and widely patent with good flow.  The  SMA and celiacs were visualized and intact.   The right hypogastric had previously been coiled and there was no flow  and the left hypogastric was intact.  Underlying fluoroscopy showed 2+  calcification of the right and left coronary artery and there was 1+  mitral annular calcification.   The main left coronary artery was long, there was 30% narrowing of  distal third.   There was a trifurcation into an OD, nondominant circumflex and  moderately large LAD, the ostial LAD had 95% followed closely by an 85%  focal stenosis in its proximal portion, in its mid third there was a 40%  narrowing.  There was also small diagonals from the mid and distal  portion of the LAD that were patent.  There was good flow throughout the  LAD, which was a good-sized vessel.   The optional diagonal was very large early bifurcated vessel, the  largest bifurcation branch had a 60-70% narrowing proximally, it was  large distally and bifurcated with good flow.   The right coronary was a dominant vessel.  There  was 70-85% tandem  lesions in the proximal third, followed by an atrial branch.  In the mid  portion of the RCA, there was a mildly focal 99% stenosis at the level  of an RV branch.  There was then a second RV branch that was patent.  Distal RCA had two PDA branches, the main PDA branch was widely patent  and large and had a 40-50% narrowing.  The PLA branch appeared to be  occluded in its proximal third with no significant collaterals  visualized.   DISCUSSION:  This 70 year old white married father of one son (estranged  from him over 30 years) with no  grandchildren and has been a heavy  smoker with known COPD.  He has known coronary artery disease from a  prior cath in 2007 by Dr. Elease Hashimoto and has been treated medically since  that time.  He discontinued to smoke, does have hypertension,  hyperlipidemia, obesity, and COPD.  He did fairly well until the last  month when he began having substernal chest pain with arm radiation  compatible with ischemia with multiple episodes on a daily basis.  He  was taking oxycodone for this, which he had on prescription for his  back, but had not used much.  He went off his home medications on his  own, but he did see his primary physician who found that he had  significant anemia that was iron-deficiency indices.  He was referred to  [place] on December 15, 2008, for further evaluation and I admitted him to the  hospital from the office with unstable angina.  He was found to have  this persistent severe anemia with hemoglobin in the low 8s and was  transfused with 2 units of packed cells.  Preop laboratory was supposed  to have been drawn but orders did not get over to the hospitals, so iron  studies were not drawn pretransfusion.  He had elevated troponin to  greater than 1.  Normal CPK and MBs, and EKG demonstrated no acute  changes with small inferior Q waves and incomplete RBBB with normal QRS  duration.  We elected to proceed with cardiac catheterization  in the  setting because of the concern for high risk three-vessel disease which  was what we found on catheterization.   Catheterization was complex because of his previous EVAR procedure in  March 2009 by Dr. Myra Gianotti for symptomatic aortic aneurysm.  At that  time, he had a Cook Zenith bifurcated graft placed with a right iliac  extension and the left iliac cuff.  He had some difficulty convalescing  and have required reintubation on day 2, because of his COPD, but  eventually recovered well.   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   The patient has severe three vessel coronary disease with severe high  grade ostial LAD and proximal mid RCA.  I believe the best approach for  this gentleman in view of his complex anatomy and comorbid disease would  be multivessel CABG.  He has significant anemia, did have a negative  hemoccult on admission and will require GI workup.  I believe his right  coronary lesion and ostial LAD are probably too high grade to allow him  to go through coloscopy procedure at present.  We will get GI  consultation for their opinion and we will get a CT of the abdomen to  rule out any metastatic disease.   Cardiovascular surgical consultation pending.   CATHETERIZATION DIAGNOSES:  1. Arteriosclerotic heart disease - accelerated unstable acute      coronary syndrome with small subendocardial myocardial infarction      by enzymes, elevated troponin 2, high progression of coronary      disease with high grade three vessel disease and mild-to-moderate      left ventricular dysfunction, ejection fraction of approximately 40-      45% with wall motion abnormalities.  2. Chronic obstructive pulmonary disease, continued cigarette abuse.  3. Exogenous obesity.  4. Hyperlipidemia on therapy.  5. Probable  sleep apnea.  6. Systemic  hypertension, patent renal arteries.  7. Patent endoluminal graft, implanted January 04, 2008, by Dr. Myra Gianotti      with no endoleak coiled right hypogastric without flow and good      flow through both limbs of the graft, right iliac extension and      left iliac cuff with suprarenal graft deployment.      Richard A. Alanda Amass, M.D.  Electronically Signed     RAW/MEDQ  D:  12/16/2008  T:  12/17/2008  Job:  [id number]   cc:   Jorge Ny, MD  [facility]  Priscille Heidelberg. Pamalee Leyden, MD

## 2011-03-04 NOTE — Cardiovascular Report (Signed)
[name], [name] NO.:  [id number]   MEDICAL RECORD NO.:  [id number]          PATIENT TYPE:  INP   LOCATION:  3713                         FACILITY:  [facility]   PHYSICIAN:  Vesta Mixer, M.D. DATE OF BIRTH:  [birth year]   DATE OF PROCEDURE:  07/31/2006  DATE OF DISCHARGE:                              CARDIAC CATHETERIZATION   HISTORY:  Patient is a 70 year old gentleman with a long history of 4-pack-a-  day cigarette smoking.  He is referred for heart catheterization after  having episodes of chest pain and a positive troponin level last week.  He  was admitted to the hospital; and was placed on the heparin and Integrilin.  He had negative enzymes during his hospitalization.   PROCEDURE:  Left heart catheterization with coronary angiography.   The right femoral artery was easily cannulated using a modified Seldinger  technique.   HEMODYNAMIC RESULTS:  LV pressure is 99/7 with an aortic pressure of 99/67.   ANGIOGRAPHY:  1. The left main has mild-to-moderate disease.  There is a mid stenosis of      approximately 30-35%.  2. The Left anterior descending artery has diffuse disease.  The LAD is      actually comprised of several large branches.  The very first branch is      a first septal perforator.  This is the largest of all of the branches.      This first septal perforator has a 50-60% ulcerated stenosis in the      proximal segment.  This branch goes down and feeds a lot of the septal      branches.  It clearly drops down into the septum;' and does not come      across the epicardium.  The true left anterior descending artery is      only a moderate-sized vessel.  There is a long 50-60% stenosis after      the takeoff of the first diagonal branch.  3. The first diagonal branch is actually larger than the true LAD.  There      are mild irregularities throughout the first diagonal branch.  4. The left circumflex artery is comprised of a first obtuse  marginal      branch; and the circumflex appears to be occluded.  I cannot see any      evidence of the continuation of the vessel, but he does have a first OM      distribution.  There are several late filling vessels that appear to be      OM-2 and OM-3 tight branches.  These vessels fill the collaterals from      the left system, but the path cannot be traced back up to the origin of      the occlusion.  It is also possible that these were from the distal      right coronary artery, but they appear to be more consistent with left      circumflex marginal branches.  5. The right coronary artery is moderate in size and is dominant.  It has  mild-to-moderate irregularities in the proximal mid segments between 40-      50%.  The distal right coronary artery has mild luminal irregularities.      The posterior descending artery is a moderate sized branch with no      significant irregularities.   LEFT VENTRICULOGRAM:  It was performed in the 30 RAO position.  It reveals  normal left ventricular systolic function with an ejection fraction of 65%.   COMPLICATIONS:  None.   CONCLUSIONS:  1. Diffuse coronary artery disease.  He has significant disease involving      his LAD and circumflex artery.  The distal circ appears to be occluded      and fills via collateral vessels.  He has moderate disease in the left      anterior      descending artery.  We will continue with medical therapy at this time.      There were no targets for PTCA or intervention; and there are really no      targets for coronary artery bypass grafting.  He definitely needs to      stop smoking.           ______________________________  Vesta Mixer, M.D.     PJN/MEDQ  D:  07/31/2006  T:  07/31/2006  Job:  [id number]   cc:   Cassell Clement, M.D.

## 2011-03-04 NOTE — Discharge Summary (Signed)
[name], [name] NO.:  [id number]   MEDICAL RECORD NO.:  [id number]          PATIENT TYPE:  INP   LOCATION:  3713                         FACILITY:  [facility]   PHYSICIAN:  Vesta Mixer, M.D. DATE OF BIRTH:  08-07-1941   DATE OF ADMISSION:  07/28/2006  DATE OF DISCHARGE:  07/31/2006                                 DISCHARGE SUMMARY   DISCHARGE DIAGNOSES:  1. Unstable angina,  2. Probable chronic obstructive pulmonary disease.  3. Dyslipidemia.  4. Hypertension.  5. Anxiety disorder.   DISCHARGE MEDICATIONS:  1. Atacand 32 mg daily.  2. Aspirin 81 mg daily.  3. Imdur 30 mg daily.  4. Lipitor 40 mg daily.  5. Nitroglycerin 0.4 mg sublingually p.r.n.  6. Xopenex inhaler 2 puffs q.6h. p.r.n.  7. Atrovent 2 puffs daily.   DISPOSITION:  The patient will see Dr. Reed Breech at [facility] on  [address].  He is to see him in 2 weeks.  He is also to see Dr. Patty Sermons  in 3-4 weeks.  He will also need to see a pulmonologist fairly soon.   HISTORY:  [name] is a 70 year old gentleman with a long history of  cigarette smoking.  He is admitted to the hospital on October 12 with  unstable angina.  Please see dictated H&P for further details.   HOSPITAL COURSE BY PROBLEMS:  1. Unstable angina.  The patient had symptoms of severe shortness of      breath with some chest pain.  He was thought to have unstable angina,      and a troponin level was 0.16.  His further enzymes during the      hospitalization were negative.  He had a heart catheterization today      which revealed moderate irregularities.  He was found to have a 50-60%      first septal perforator.  His distal circumflex artery was occluded,      and the distal aspect of the obtuse marginal branch is filled via      collateral vessels.  The right coronary artery had moderate      irregularities.  He had normal left ventricular systolic function.  I      did not see any evidence for recent  acute coronary syndrome.  It is      quite possible that he had a COPD exacerbation which caused his      troponin levels to go up.  He clearly does have some coronary artery      disease and is at risk for further coronary artery disease and      continues to smoke.  We will discharge him today and satisfactory      condition.  There is no indication for angioplasty or bypass surgery at      this time.  He has been instructed to stop smoking.  We will also start      him on Lipitor 40 mg daily.  He has been on Lipitor in the past and      then stopped it for some reason.  1. Chronic obstructive pulmonary disease.  The patient smokes 4 packs a      day for the past many years.  He quite likely has very severe COPD.  We      will have him see his medical doctor for referral to a pulmonologist.           ______________________________  Vesta Mixer, M.D.     PJN/MEDQ  D:  07/31/2006  T:  08/01/2006  Job:  [id number]   cc:   [facility] [address]  Cassell Clement, M.D.

## 2011-03-04 NOTE — H&P (Signed)
[place]  Patient:    [name], [name]                     MRN: [id number] Adm. Date:  [id number] Attending:  Armanda Heritage                         History and Physical  CHIEF COMPLAINT:  Chest pain.  HISTORY OF PRESENT ILLNESS:  This is a 70 year old married Caucasian gentleman admitted with chest pain.  He had the onset of severe substernal chest pain this morning while walking down the hall.  It was associated with radiation into both arms and both arms felt heavy and numb.  He had nausea and dry heaves, and wretching but no vomitus.  He was mildly diaphoretic.  He does not know if he was pale or not.  There is no prior history of known coronary disease.  He states that about 4 years ago he had a negative treadmill test at my office.  He is on no regular medications but takes aspirin when he feels the need for it.  FAMILY HISTORY:  Reveals that his father of Cherlynn Polo disease, mother died of heart problems at age 69.  He has a sister who has diabetes.  SOCIAL HISTORY:  Reveals that he is retired.  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  He smokes at least 2 packs of cigarettes a day.  He drinks moderately alcohol and he drank quite a bit of alcohol over the weekend.  He is married, on his third marriage.  He has one child by his first wife.  ALLERGIES:  PENICILLIN.  PAST SURGICAL HISTORY:  Appendectomy.  REVIEW OF SYSTEMS:  A history of hiatal hernia.  Denies any genitourinary symptoms.  He denies any cough, sputum production or pneumonia.  The remainder of his review of systems is unremarkable.  PHYSICAL EXAMINATION:  VITAL SIGNS:  Blood pressure 125/85, pulse is 90, respirations are normal.  HEENT:  Negative.  Jugular venous pressure normal.  Carotids normal.  CHEST:  Clear.  HEART:  No murmur, gallop, rub or click.  ABDOMEN:  Soft and nontender.  EXTREMITIES:  Good pulses, no edema or phlebitis.  EKG:  Shows  normal sinus rhythm, no acute change.  CHEST X-RAY:  Shows no active disease.  LABORATORY DATA:  Negative so far.  DIAGNOSTIC IMPRESSION: 1. Chest pain with bilateral arm radiation, rule out unstable angina.  Rule    out MI. 2. Cigarette abuse. 3. Past history of hiatal hernia.  DISPOSITION:  Admit to telemetry to observation status.  Serial enzymes will be obtained.  We will put him on Lovenox, and beta blockers and aspirin and use sublingual nitroglycerin p.r.n.  Further tests as indicated by clinical course. DD:  01/08/01 TD:  01/08/01 Job: [id number]

## 2011-03-04 NOTE — Discharge Summary (Signed)
[place]. [facility]  Patient:    [name], [name]                     MRN: [id number] Adm. Date:  [id number] Disc. Date: [id number] Attending:  Rudean Hitt                           Discharge Summary  FINAL DIAGNOSES: 1. Chest pain, myocardial infarction ruled out. 2. Tobacco use disorder. 3. Hiatal hernia.  OPERATIONS:  None.  HISTORY OF PRESENT ILLNESS:  This 70 year old married Caucasian gentleman was admitted with chest pain.  He was walking down the hall when he had the onset of severe substernal chest pain with radiation into both arms and associated with nausea, dry heaves, but no vomiting, and he was mildly diaphoretic.  He had no prior history of known coronary disease and states that he had a negative treadmill test in our office about four years ago.  Those records are not presently available.  He is on no regular medicines but takes an aspirin when he feels that he needs it.  ADMISSION PHYSICAL EXAMINATION:  Unremarkable.  HOSPITAL COURSE:  The patient was admitted to telemetry.  Serial enzymes were obtained.  He was initially placed on Lovenox, beta blockers, and aspirin.  He had no further chest discomfort.  His enzymes came back negative x 3, and his repeat electrocardiogram remained negative for ischemia.  He was feeling well and was able to be discharged improved on the evening of January 09, 2001, to be followed up in the office, and an outpatient Cardiolite stress test will be obtained.  The patient was counseled on not resuming smoking.  DISCHARGE MEDICATIONS: 1. Protonix 40 mg 1 daily. 2. Nitrostat 1/150 sublingually p.r.n. 3. Coated aspirin 81 mg daily. 4. Mylanta as needed.  DISCHARGE INSTRUCTIONS:  He is to be on a low cholesterol diet.  He is going to reduce smoking and try to quit.  FOLLOW-UP:  He will be seen back in the office for a treadmill Cardiolite stress test, and he will call our office to arrange  that.  CONDITION ON DISCHARGE:  Improved. DD:  01/28/01 TD:  01/28/01 Job: [id number]

## 2011-03-04 NOTE — H&P (Signed)
NAMEGREG, [name] NO.:  [id number]   MEDICAL RECORD NO.:  [id number]            PATIENT TYPE:   LOCATION:                                 FACILITY:   PHYSICIAN:  Vesta Mixer, M.D.      DATE OF BIRTH:   DATE OF ADMISSION:  DATE OF DISCHARGE:                                HISTORY & PHYSICAL   HISTORY OF PRESENT ILLNESS:  [name] is a middle-aged gentleman with a  long history of cigarette smoking and history of chest pains.  He is  admitted the hospital for heart catheterization after having an episode of  chest pain.   [name] was previously seen by me at approximately 10 years ago.  He  was more recently seen by [name] when he was admitted to the hospital  for chest pain.  He ruled out for myocardial infarction during that  admission.  He had a stress Cardiolite study which was negative for  ischemia.  He is not been seen in our office since 2002.   He presents today with recent onset of chest pain.   The patient describes a severe episode of chest discomfort this morning.  The pain lasted approximately one hour.  It radiated out to his arms.  Family member said that he looked fairly gray.  He refused to go to the  hospital but wanted to come to the office.  He states he took an aspirin and  feels quite a bit better.  He is not had any residual effects.  He denies  any syncope or presyncope.  He denies any shortness of breath.  He also  denies any PND or orthopnea.   CURRENT MEDICATIONS:  1. Atacand once a day.  2. Ibuprofen 200 mg, 2 tablets in the morning.   ALLERGIES:  INJECTABLE PENICILLIN.   PAST MEDICAL HISTORY:  Hypertension.   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  He smokes between two  and four packs of cigarettes a day.  He drinks beer on a regular basis.   FAMILY HISTORY:  His father died of Truddie Hidden Gehrig's disease.  His mother died  of heart problems at age 17.  He has a sister with diabetes.   REVIEW OF SYSTEMS:  Review of systems was reviewed and is essentially  negative except as noted in the HPI.   PHYSICAL EXAMINATION:  GENERAL:  On exam he is a middle-aged gentleman in no  acute distress.  He is alert and oriented x3 and his mood and affect are  normal.  VITAL SIGNS:  His weight is 226, blood pressure is 134/90 with heart rate of  101.  HEENT: Exam reveals 2+ carotids.  He has no bruits, no JVD, no thyromegaly.  LUNGS:  Clear to auscultation.  HEART:  Regular rate, S1-S2 no murmurs.  ABDOMEN:  Abdominal exam reveals good bowel sounds and is nontender.  EXTREMITIES:  He has no clubbing, cyanosis or edema.  NEURO:  Exam is nonfocal.   LABORATORY DATA:  His EKG reveals sinus tachycardia at 101 beats a minute.  He has a right bundle branch block.  He has some associated ST-T wave  changes but no acute changes.   Laboratory data including CBC and complete metabolic profile, PT/PTT,  Troponin-I, CPK, MB levels all are pending.   IMPRESSION:  [name] presents with an episode of chest discomfort.  He  refuses to go to the hospital even now.  He states that his chest pain is  clearly much better.  I suspect that he has coronary artery disease.   I have asked to make sure that he takes an aspirin every day.  I have  started him on Imdur 30 milligrams  a day and have given him some new  nitroglycerin.  I have given him some samples of Lipitor and I have asked  him take 40 milligrams every day.  We will schedule him for heart  catheterization on Friday.  I have asked him to call us right away if he has  any worsening problems, or call  9-1-1 if he has chest pain that is not relieved with nitroglycerin.  All of  his other medical problems are unchanged.           ______________________________  Vesta Mixer, M.D.     PJN/MEDQ  D:  07/26/2006  T:  07/27/2006  Job:  [id number]   cc:   [name], M.D.  [name], M.D.

## 2011-03-16 ENCOUNTER — Ambulatory Visit: Payer: Medicare Other | Admitting: Family Medicine

## 2011-03-16 ENCOUNTER — Other Ambulatory Visit: Payer: Self-pay | Admitting: *Deleted

## 2011-03-16 MED ORDER — ZOLPIDEM TARTRATE 10 MG PO TABS: 10.0000 mg | ORAL_TABLET | Freq: Every evening | ORAL | Status: DC | PRN

## 2011-03-16 NOTE — Telephone Encounter (Signed)
Rx called to [facility]. 

## 2011-03-21 ENCOUNTER — Telehealth: Payer: Self-pay | Admitting: *Deleted

## 2011-03-21 ENCOUNTER — Ambulatory Visit (INDEPENDENT_AMBULATORY_CARE_PROVIDER_SITE_OTHER): Payer: Medicare Other | Admitting: Family Medicine

## 2011-03-21 ENCOUNTER — Ambulatory Visit: Payer: Medicare Other | Admitting: Family Medicine

## 2011-03-21 ENCOUNTER — Encounter: Payer: Self-pay | Admitting: Family Medicine

## 2011-03-21 VITALS — BP 130/80 | HR 71 | Temp 97.6°F | Ht 75.0 in | Wt 260.0 lb

## 2011-03-21 DIAGNOSIS — I219 Acute myocardial infarction, unspecified: Secondary | ICD-10-CM

## 2011-03-21 DIAGNOSIS — J438 Other emphysema: Secondary | ICD-10-CM

## 2011-03-21 MED ORDER — ALPRAZOLAM 0.25 MG PO TABS: 0.2500 mg | ORAL_TABLET | Freq: Three times a day (TID) | ORAL | Status: DC | PRN

## 2011-03-21 NOTE — Progress Notes (Signed)
70 yo with multiple chronic medical issues, here for SOB.  Has h/o severe COPD.  Followed by Dr. Shelle Iron, appears his last office visit with him was 01/29/2010.  Was scheduled to see Dr. Reece Agar for same symptoms last week but cancelled because he only wants to see one doctor. Feels that he and Dr. Shelle Iron did not have a good interaction.  Wants to see someone else.    Has not smoked in 5 months.  Past several weeks, feels more short of breath.  Having panic attacks when he can't catch his breath. Refuses to take any of the inhalers, such as advair or spiriva because "nothing helps." Only takes his rescue inhalers as needed.  Does not qualify for home 02 based on his sats at rest and exertion but he is asking why he cannot have O2.  Very deconditioned, sits in his recliner most of the day.  Wt Readings from Last 3 Encounters:  03/21/11 260 lb (117.935 kg)  02/03/11 255 lb (115.667 kg)  11/11/10 258 lb (117.028 kg)    Has h/o CAD, s/p MI, LV dysfunction.  Denies any CP currently.    The PMH, PSH, Social History, Family History, Medications, and allergies have been reviewed in [facility], and have been updated if relevant.  Physical Exam General:  obese male in nad Lungs:  mild decrease in breath sounds, no wheezing or rhonchi, lungs actually sound better than they have before Heart:  rrr, no mrg Extremities:  mild ankle edema, no cyanosis. Neurologic:  alert and oriented, moves all 4.   Impression & Recommendations:  Problem # 1:  EMPHYSEMA, SEVERE  >25 min spent with face to face with patient counseling and coordinating care. Discussed need for pulmonary, as pt is refusing to take any inhaled steroids that I have offered, so this is now outside my scope of practice. He will call [place] [facility] to schedule an appt with another provider. He is also severely deconditioned so I am going to try to refer to pulmonary rehab, although referral likely requires a pulmonologist.

## 2011-03-21 NOTE — Telephone Encounter (Signed)
PLEASE CALL PATIENT TO CONFIRM.

## 2011-03-22 NOTE — Telephone Encounter (Signed)
Called spoke with patient who states he recently saw his PCP for increased SOB and was told that he needs to f/u with pulmonary.  However, pt is requesting to change from [facility] to another physician.  Pt states he is taking his spiriva and symbicort with no relief from the SOB and has not smoked x5-68months.  Pt has not been seen in the office since 01/2010 and the last documented refill on the meds in EMR was 02/2010 with 4 refills - symbicort has since been removed from the med list.  KC please advise if patient may switch from your services.

## 2011-03-22 NOTE — Telephone Encounter (Signed)
Dr Sung Amabile is the only MD in this group who graduated from [facility].  Oked with Tammy to put on his schedule for evaluation.  Will need to be a consult.

## 2011-03-23 NOTE — Telephone Encounter (Signed)
Called spoke with patient's wife.  appt scheduled with DS for 6.13.12 @ 1400.  Pt wife is aware to bring updated insurance cards and to arrive early for paperwork.  Pt wife okay with this date and time.

## 2011-03-28 ENCOUNTER — Encounter: Payer: Self-pay | Admitting: Pulmonary Disease

## 2011-03-30 ENCOUNTER — Encounter: Payer: Self-pay | Admitting: Pulmonary Disease

## 2011-03-30 ENCOUNTER — Ambulatory Visit (INDEPENDENT_AMBULATORY_CARE_PROVIDER_SITE_OTHER): Payer: Medicare Other | Admitting: Pulmonary Disease

## 2011-03-30 DIAGNOSIS — J438 Other emphysema: Secondary | ICD-10-CM

## 2011-03-30 DIAGNOSIS — E669 Obesity, unspecified: Secondary | ICD-10-CM

## 2011-03-30 DIAGNOSIS — R0989 Other specified symptoms and signs involving the circulatory and respiratory systems: Secondary | ICD-10-CM

## 2011-03-30 DIAGNOSIS — R0600 Dyspnea, unspecified: Secondary | ICD-10-CM

## 2011-03-30 NOTE — Patient Instructions (Signed)
1) Use Symbicort as instructed until gone. Take note of whether your symptoms improve with the medication and whether they get worse when you stop the medication. 2) Pulmonary Rehab has been ordered for you - please participate in this program enthusiastically. 3) [employer] weight 230 lbs.  At time of next visit, I would like to see your weight down to 255 lbs

## 2011-03-30 NOTE — Progress Notes (Addendum)
Subjective:    Patient ID: [name], male    DOB: 06/04/1941, 70 y.o.   MRN: [id number]  HPI 19 yowm former patient of Dr Shelle Iron last seen approx one year ago for COPD/emphysema. He has been tried on multiple inhaled medications including Advair, Symbicort and Spiriva all with no discernible benefit.  Reportedly told by Dr Shelle Iron that there was nothing more that can be done. Presents again for "second opinion" and reports continued severe DOE (class III symptoms). He has episodes of panic assoc with the dyspnea and has been prescribed Xanax by his primary MD.  Has stopped smoking since last seen by Dr Shelle Iron (quit 5 mos ago). Denies exertional CP but does have occasional epigastric pain. No history of hemoptysis, LE edema or calf tenderness. Intermittent cough is mostly NP.   Review of Systems  Constitutional: Negative for fever and unexpected weight change.  HENT: Negative for ear pain, nosebleeds, congestion, sore throat, rhinorrhea, sneezing, trouble swallowing, dental problem, postnasal drip and sinus pressure.   Eyes: Negative for redness and itching.  Respiratory: Positive for cough and shortness of breath. Negative for chest tightness and wheezing.   Cardiovascular: Negative for palpitations and leg swelling.  Gastrointestinal: Negative for nausea and vomiting.  Genitourinary: Negative for dysuria.  Musculoskeletal: Negative for joint swelling.  Skin: Negative for rash.  Neurological: Negative for headaches.  Hematological: Does not bruise/bleed easily.  Psychiatric/Behavioral: Negative for dysphoric mood. The patient is not nervous/anxious.        Objective:   Physical Exam  Constitutional: He is oriented to person, place, and time and well-developed, well-nourished, and in no distress.  HENT:  Head: Normocephalic and atraumatic.  Right Ear: External ear normal.  Left Ear: External ear normal.  Mouth/Throat: Oropharynx is clear and moist.  Eyes: EOM are normal. Pupils  are equal, round, and reactive to light.  Neck: Normal range of motion. Neck supple. No JVD present. No thyromegaly present.  Cardiovascular: Normal rate, normal heart sounds and intact distal pulses.        Occasional extrasystoles  Pulmonary/Chest: Effort normal. No stridor. No respiratory distress. He has no wheezes. He has rales. He exhibits no tenderness.       Left basilar rales. Distant BS throughout. No wheezing  Abdominal: Soft. Bowel sounds are normal. There is no tenderness.  Musculoskeletal: Normal range of motion. He exhibits no edema and no tenderness.  Lymphadenopathy:    He has no cervical adenopathy.  Neurological: He is alert and oriented to person, place, and time. He has normal reflexes. No cranial nerve deficit.  Skin: Skin is warm and dry.  Psychiatric: Affect normal.      Physical Exam  Constitutional: He is oriented to person, place, and time and well-developed, well-nourished, and in no distress.  HENT:  Head: Normocephalic and atraumatic.  Right Ear: External ear normal.  Left Ear: External ear normal.  Mouth/Throat: Oropharynx is clear and moist.  Eyes: EOM are normal. Pupils are equal, round, and reactive to light.  Neck: Normal range of motion. Neck supple. No JVD present. No thyromegaly present.  Cardiovascular: Normal rate, normal heart sounds and intact distal pulses.        Occasional extrasystoles  Pulmonary/Chest: Effort normal. No stridor. No respiratory distress. He has no wheezes. He has rales. He exhibits no tenderness.       Left basilar rales. Distant BS throughout. No wheezing  Abdominal: Soft. Bowel sounds are normal. There is no tenderness.  Musculoskeletal: Normal range of  motion. He exhibits no edema and no tenderness.  Lymphadenopathy:    He has no cervical adenopathy.  Neurological: He is alert and oriented to person, place, and time. He has normal reflexes. No cranial nerve deficit.  Skin: Skin is warm and dry.  Psychiatric: Affect  normal.      Assessment & Plan:  Severe COPD - predominantly emphysema. Previously unresponsive to Bdilators. Recently quit smoking.  Moderate obesity Suspect component of deconditioning  1) Congratulated him on his success at smoking cessation. Counseled to remain abstinent 2) Retry Spiriva X 2 weeks. Followup in 4 weeks to discuss whether there is any discernible benefit with either starting the med or any worsening of symptoms after stopping.  3) Counseled re: weight loss. Goal Wt 255 @ f/u appt in 4 weeks 4) Referral to [facility]

## 2011-04-14 ENCOUNTER — Other Ambulatory Visit: Payer: Self-pay | Admitting: *Deleted

## 2011-04-14 MED ORDER — ZOLPIDEM TARTRATE 10 MG PO TABS: 10.0000 mg | ORAL_TABLET | Freq: Every evening | ORAL | Status: DC | PRN

## 2011-04-15 NOTE — Telephone Encounter (Signed)
Rx called to [facility]. 

## 2011-04-16 ENCOUNTER — Other Ambulatory Visit: Payer: Self-pay | Admitting: Pulmonary Disease

## 2011-04-18 ENCOUNTER — Telehealth: Payer: Self-pay | Admitting: Pulmonary Disease

## 2011-04-18 MED ORDER — BUDESONIDE-FORMOTEROL FUMARATE 160-4.5 MCG/ACT IN AERO: 2.0000 | INHALATION_SPRAY | Freq: Two times a day (BID) | RESPIRATORY_TRACT | Status: DC

## 2011-04-18 NOTE — Telephone Encounter (Signed)
Per pt instructions from 03/30/11 with DS: 1) Use Symbicort as instructed until gone. Take note of whether your symptoms improve with the medication and whether they get worse when you stop the medication.    ATC # provided but NA and unable to leave message.  ? Did pt stop the symbicort?  If so, did symptoms get worse.

## 2011-04-18 NOTE — Telephone Encounter (Signed)
Spoke with the pt and he states that Symbicort has worked very well for him. He states his has seen an improvement in his breathing since being on the medication. He states he has been with out the med x 2 days and has startd to cough so he wants rx for Symbicort. I advised in his OV note DS states to do spiriva x 2 weeks. Pt states he gave him a sample of Symbicort 160 not Spiriva. So rx sent. Pt aware.Carron Curie, CMA

## 2011-04-18 NOTE — Telephone Encounter (Signed)
Patient has just finished sample and he states symptoms and breathing are a lot better while using symbicort. This is why he is needing refill.

## 2011-05-09 ENCOUNTER — Ambulatory Visit: Payer: Self-pay | Admitting: Surgery

## 2011-05-09 ENCOUNTER — Encounter (INDEPENDENT_AMBULATORY_CARE_PROVIDER_SITE_OTHER): Payer: Medicare Other

## 2011-05-09 DIAGNOSIS — I714 Abdominal aortic aneurysm, without rupture: Secondary | ICD-10-CM

## 2011-05-09 DIAGNOSIS — Z48812 Encounter for surgical aftercare following surgery on the circulatory system: Secondary | ICD-10-CM

## 2011-06-07 ENCOUNTER — Other Ambulatory Visit: Payer: Self-pay | Admitting: *Deleted

## 2011-06-07 MED ORDER — HYDROCODONE-ACETAMINOPHEN 5-500 MG PO TABS: 1.0000 | ORAL_TABLET | ORAL | Status: DC | PRN

## 2011-06-07 NOTE — Telephone Encounter (Signed)
Last refill 02/11/2011.

## 2011-06-07 NOTE — Telephone Encounter (Signed)
Rx called to pharmacy

## 2011-06-10 NOTE — Procedures (Unsigned)
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  HISTORY: Diabetes:  No. Cardiac:  Yes. Hypertension:  No.  EXAM:  AAA sac size 4.64 cm AP, 4.84 cm transverse. Previous sac size 10/25/2010 5.3 cm AP, 5.4 cm transverse.  IMPRESSION: 1. Technically difficult study secondary to patient body habitus and     bowel gas. 2. The aorta and endograft appear patent. 3. No significant change in the size of the aneurysmal sac surrounding     endograft where visualized. 4. Endoleak cannot be ruled out due to bowel gas pattern.  ___________________________________________ V. Charlena Cross, MD  EM/MEDQ  D:  05/10/2011  T:  05/10/2011  Job:  [id number]

## 2011-07-11 LAB — CBC
HCT: 25.2 — ABNORMAL LOW
HCT: 27 — ABNORMAL LOW
HCT: 30.4 — ABNORMAL LOW
HCT: 30.7 — ABNORMAL LOW
HCT: 31.1 — ABNORMAL LOW
HCT: 31.7 — ABNORMAL LOW
HCT: 34.8 — ABNORMAL LOW
HCT: 40.4
Hemoglobin: 10.4 — ABNORMAL LOW
Hemoglobin: 10.4 — ABNORMAL LOW
Hemoglobin: 10.7 — ABNORMAL LOW
Hemoglobin: 13.4
Hemoglobin: 6.6 — CL
Hemoglobin: 8.5 — ABNORMAL LOW
Hemoglobin: 9.2 — ABNORMAL LOW
Hemoglobin: 9.9 — ABNORMAL LOW
MCHC: 33.2
MCHC: 33.2
MCHC: 33.3
MCHC: 33.6
MCHC: 33.8
MCHC: 33.8
MCHC: 33.8
MCV: 86.2
MCV: 86.6
MCV: 86.7
MCV: 87.2
MCV: 87.4
MCV: 87.9
Platelets: 140 — ABNORMAL LOW
Platelets: 145 — ABNORMAL LOW
Platelets: 176
Platelets: 226
Platelets: 240
Platelets: 310
Platelets: 359
RBC: 2.27 — ABNORMAL LOW
RBC: 2.77 — ABNORMAL LOW
RBC: 2.9 — ABNORMAL LOW
RBC: 3.13 — ABNORMAL LOW
RBC: 3.15 — ABNORMAL LOW
RBC: 3.41 — ABNORMAL LOW
RBC: 3.56 — ABNORMAL LOW
RBC: 4.66
RDW: 15.5
RDW: 15.6 — ABNORMAL HIGH
RDW: 15.6 — ABNORMAL HIGH
RDW: 15.8 — ABNORMAL HIGH
RDW: 15.8 — ABNORMAL HIGH
RDW: 15.9 — ABNORMAL HIGH
RDW: 16 — ABNORMAL HIGH
RDW: 16.4 — ABNORMAL HIGH
WBC: 10.9 — ABNORMAL HIGH
WBC: 12.8 — ABNORMAL HIGH
WBC: 13.3 — ABNORMAL HIGH
WBC: 14.3 — ABNORMAL HIGH
WBC: 6.7
WBC: 9.8

## 2011-07-11 LAB — I-STAT 8, (EC8 V) (CONVERTED LAB)
Acid-base deficit: 2
BUN: 12
Bicarbonate: 22.6
Chloride: 111
Glucose, Bld: 119 — ABNORMAL HIGH
HCT: 42
Hemoglobin: 14.3
Operator id: 161631
Potassium: 4.3
Sodium: 139
TCO2: 24
pCO2, Ven: 35.9 — ABNORMAL LOW
pH, Ven: 7.408 — ABNORMAL HIGH

## 2011-07-11 LAB — BASIC METABOLIC PANEL
BUN: 17
BUN: 18
BUN: 19
BUN: 6
BUN: 8
BUN: 9
CO2: 27
CO2: 29
CO2: 29
CO2: 30
CO2: 31
Calcium: 6.5 — ABNORMAL LOW
Calcium: 7.5 — ABNORMAL LOW
Calcium: 7.7 — ABNORMAL LOW
Calcium: 7.8 — ABNORMAL LOW
Calcium: 7.8 — ABNORMAL LOW
Calcium: 7.8 — ABNORMAL LOW
Chloride: 100
Chloride: 110
Chloride: 93 — ABNORMAL LOW
Chloride: 96
Chloride: 99
Creatinine, Ser: 0.77
Creatinine, Ser: 0.79
Creatinine, Ser: 0.91
Creatinine, Ser: 0.95
GFR calc Af Amer: >60
GFR calc Af Amer: >60
GFR calc Af Amer: >60
GFR calc Af Amer: >60
GFR calc Af Amer: >60
GFR calc Af Amer: >60
GFR calc non Af Amer: >60
GFR calc non Af Amer: >60
GFR calc non Af Amer: >60
GFR calc non Af Amer: >60
GFR calc non Af Amer: >60
GFR calc non Af Amer: >60
Glucose, Bld: 114 — ABNORMAL HIGH
Glucose, Bld: 127 — ABNORMAL HIGH
Glucose, Bld: 139 — ABNORMAL HIGH
Glucose, Bld: 141 — ABNORMAL HIGH
Glucose, Bld: 148 — ABNORMAL HIGH
Glucose, Bld: 171 — ABNORMAL HIGH
Potassium: 3.3 — ABNORMAL LOW
Potassium: 3.4 — ABNORMAL LOW
Potassium: 4
Potassium: 4.3
Potassium: 4.4
Potassium: 4.4
Potassium: 4.6
Sodium: 133 — ABNORMAL LOW
Sodium: 133 — ABNORMAL LOW
Sodium: 134 — ABNORMAL LOW
Sodium: 135
Sodium: 136
Sodium: 136
Sodium: 137
Sodium: 138

## 2011-07-11 LAB — POCT I-STAT, CHEM 8
BUN: 12
BUN: 8
Calcium, Ion: 1.04 — ABNORMAL LOW
Calcium, Ion: 1.17
Chloride: 102
Chloride: 96
Creatinine, Ser: 1.2
Creatinine, Ser: 1.2
Glucose, Bld: 116 — ABNORMAL HIGH
Glucose, Bld: 148 — ABNORMAL HIGH
HCT: 25 — ABNORMAL LOW
HCT: 33 — ABNORMAL LOW
Hemoglobin: 11.2 — ABNORMAL LOW
Hemoglobin: 8.5 — ABNORMAL LOW
Potassium: 3.2 — ABNORMAL LOW
Potassium: 4.3
Sodium: 134 — ABNORMAL LOW
Sodium: 138
TCO2: 25
TCO2: 31

## 2011-07-11 LAB — POCT I-STAT 3, ART BLOOD GAS (G3+)
Acid-Base Excess: 2
Acid-Base Excess: 2
Acid-Base Excess: 2
Acid-Base Excess: 4 — ABNORMAL HIGH
Acid-Base Excess: 4 — ABNORMAL HIGH
Acid-Base Excess: 5 — ABNORMAL HIGH
Acid-Base Excess: 6 — ABNORMAL HIGH
Acid-Base Excess: 7 — ABNORMAL HIGH
Acid-base deficit: 1
Acid-base deficit: 3 — ABNORMAL HIGH
Acid-base deficit: 7 — ABNORMAL HIGH
Bicarbonate: 20.6
Bicarbonate: 23.2
Bicarbonate: 26.4 — ABNORMAL HIGH
Bicarbonate: 26.7 — ABNORMAL HIGH
Bicarbonate: 26.7 — ABNORMAL HIGH
Bicarbonate: 26.9 — ABNORMAL HIGH
Bicarbonate: 27.7 — ABNORMAL HIGH
Bicarbonate: 28 — ABNORMAL HIGH
Bicarbonate: 29.3 — ABNORMAL HIGH
Bicarbonate: 29.4 — ABNORMAL HIGH
Bicarbonate: 31 — ABNORMAL HIGH
Bicarbonate: 32.2 — ABNORMAL HIGH
O2 Saturation: 90
O2 Saturation: 91
O2 Saturation: 93
O2 Saturation: 96
O2 Saturation: 96
O2 Saturation: 97
O2 Saturation: 97
O2 Saturation: 97
O2 Saturation: 97
O2 Saturation: 97
O2 Saturation: 98
O2 Saturation: 99
Operator id: 183861
Operator id: 199821
Operator id: 273681
Operator id: 273681
Operator id: 283371
Operator id: 284121
Operator id: 284591
Operator id: 297551
Operator id: 298181
Operator id: 298181
Operator id: 302581
Operator id: 302581
Patient temperature: 36.1
Patient temperature: 36.7
Patient temperature: 38.1
Patient temperature: 38.5
Patient temperature: 98.5
Patient temperature: 98.6
Patient temperature: 98.6
Patient temperature: 99.2
Patient temperature: 99.2
Patient temperature: 99.3
Patient temperature: 99.4
Patient temperature: 99.4
TCO2: 22
TCO2: 25
TCO2: 28
TCO2: 28
TCO2: 28
TCO2: 28
TCO2: 29
TCO2: 30
TCO2: 31
TCO2: 31
TCO2: 32
TCO2: 34
pCO2 arterial: 40
pCO2 arterial: 41.1
pCO2 arterial: 41.4
pCO2 arterial: 41.5
pCO2 arterial: 42.2
pCO2 arterial: 42.4
pCO2 arterial: 42.6
pCO2 arterial: 45.2 — ABNORMAL HIGH
pCO2 arterial: 46.5 — ABNORMAL HIGH
pCO2 arterial: 52.7 — ABNORMAL HIGH
pCO2 arterial: 54.8 — ABNORMAL HIGH
pCO2 arterial: 70.1
pH, Arterial: 7.213 — ABNORMAL LOW
pH, Arterial: 7.25 — ABNORMAL LOW
pH, Arterial: 7.3 — ABNORMAL LOW
pH, Arterial: 7.343 — ABNORMAL LOW
pH, Arterial: 7.396
pH, Arterial: 7.415
pH, Arterial: 7.418
pH, Arterial: 7.419
pH, Arterial: 7.42
pH, Arterial: 7.451 — ABNORMAL HIGH
pH, Arterial: 7.454 — ABNORMAL HIGH
pH, Arterial: 7.481 — ABNORMAL HIGH
pO2, Arterial: 103 — ABNORMAL HIGH
pO2, Arterial: 104 — ABNORMAL HIGH
pO2, Arterial: 156 — ABNORMAL HIGH
pO2, Arterial: 62 — ABNORMAL LOW
pO2, Arterial: 67 — ABNORMAL LOW
pO2, Arterial: 77 — ABNORMAL LOW
pO2, Arterial: 82
pO2, Arterial: 82
pO2, Arterial: 84
pO2, Arterial: 85
pO2, Arterial: 88
pO2, Arterial: 90

## 2011-07-11 LAB — POCT CARDIAC MARKERS
CKMB, poc: 1.2
Myoglobin, poc: 87.6
Operator id: 161631
Troponin i, poc: <0.05

## 2011-07-11 LAB — CARDIAC PANEL(CRET KIN+CKTOT+MB+TROPI)
CK, MB: 1.5
CK, MB: 2
Relative Index: 0.7
Relative Index: 1.3
Total CK: 120
Total CK: 184
Troponin I: 0.01

## 2011-07-11 LAB — CULTURE, BLOOD (ROUTINE X 2): Culture: NO GROWTH

## 2011-07-11 LAB — CROSSMATCH
ABO/RH(D): B NEG
ABO/RH(D): B NEG
Antibody Screen: NEGATIVE

## 2011-07-11 LAB — OCCULT BLOOD X 1 CARD TO LAB, STOOL: Fecal Occult Bld: NEGATIVE

## 2011-07-11 LAB — DIFFERENTIAL
Basophils Absolute: 0.1
Basophils Relative: 1
Eosinophils Absolute: 0.1
Eosinophils Relative: 1
Lymphocytes Relative: 12
Lymphs Abs: 1.6
Monocytes Absolute: 0.9
Monocytes Relative: 7
Neutro Abs: 10.7 — ABNORMAL HIGH
Neutrophils Relative %: 80 — ABNORMAL HIGH

## 2011-07-11 LAB — URINALYSIS, ROUTINE W REFLEX MICROSCOPIC
Bilirubin Urine: NEGATIVE
Glucose, UA: NEGATIVE
Ketones, ur: NEGATIVE
Nitrite: NEGATIVE
Protein, ur: 30 — AB
Urobilinogen, UA: 0.2
Urobilinogen, UA: 0.2

## 2011-07-11 LAB — B-NATRIURETIC PEPTIDE (CONVERTED LAB)
Pro B Natriuretic peptide (BNP): 127 — ABNORMAL HIGH
Pro B Natriuretic peptide (BNP): 156 — ABNORMAL HIGH

## 2011-07-11 LAB — POCT I-STAT CREATININE
Creatinine, Ser: 1.1
Operator id: 161631

## 2011-07-11 LAB — POCT I-STAT 7, (LYTES, BLD GAS, ICA,H+H)
Acid-Base Excess: 1
Acid-base deficit: 1
Bicarbonate: 25.5 — ABNORMAL HIGH
Bicarbonate: 27.1 — ABNORMAL HIGH
Calcium, Ion: 1.16
Calcium, Ion: 1.16
HCT: 32 — ABNORMAL LOW
HCT: 33 — ABNORMAL LOW
Hemoglobin: 10.9 — ABNORMAL LOW
Hemoglobin: 11.2 — ABNORMAL LOW
O2 Saturation: 100
O2 Saturation: 100
Operator id: 238531
Operator id: 238531
Patient temperature: 35
Patient temperature: 35.6
Potassium: 4.6
Potassium: 4.6
Sodium: 139
Sodium: 139
TCO2: 27
TCO2: 29
pCO2 arterial: 44.4
pCO2 arterial: 45.8 — ABNORMAL HIGH
pH, Arterial: 7.36
pH, Arterial: 7.371
pO2, Arterial: 370 — ABNORMAL HIGH
pO2, Arterial: 531 — ABNORMAL HIGH

## 2011-07-11 LAB — COMPREHENSIVE METABOLIC PANEL
ALT: 16
AST: 24
Alkaline Phosphatase: 97
CO2: 27
Calcium: 7.8 — ABNORMAL LOW
GFR calc Af Amer: >60
GFR calc non Af Amer: >60
Glucose, Bld: 145 — ABNORMAL HIGH
Potassium: 4.2
Sodium: 136
Total Protein: 4.8 — ABNORMAL LOW

## 2011-07-11 LAB — CALCIUM, IONIZED
Calcium, Ion: 1.12
Calcium, Ion: 1.16

## 2011-07-11 LAB — URINE CULTURE
Colony Count: NO GROWTH
Colony Count: NO GROWTH

## 2011-07-11 LAB — CULTURE, BAL-QUANTITATIVE W GRAM STAIN

## 2011-07-11 LAB — URINE MICROSCOPIC-ADD ON

## 2011-07-11 LAB — MAGNESIUM: Magnesium: 2.3

## 2011-07-11 LAB — AMYLASE: Amylase: 72

## 2011-07-11 LAB — ABO/RH: ABO/RH(D): B NEG

## 2011-07-11 LAB — PHOSPHORUS: Phosphorus: 3.2

## 2011-07-11 LAB — APTT: aPTT: 30

## 2011-07-11 LAB — OSMOLALITY, URINE: Osmolality, Ur: 586

## 2011-07-12 LAB — CBC
HCT: 29 — ABNORMAL LOW
HCT: 30.2 — ABNORMAL LOW
Hemoglobin: 10.1 — ABNORMAL LOW
Hemoglobin: 9.6 — ABNORMAL LOW
MCHC: 33
MCHC: 33.4
MCHC: 33.6
MCHC: 33.6
MCV: 86.4
MCV: 86.6
Platelets: 368
Platelets: 384
RBC: 3.32 — ABNORMAL LOW
RBC: 3.48 — ABNORMAL LOW
RDW: 15.2
RDW: 15.5
WBC: 14.5 — ABNORMAL HIGH

## 2011-07-12 LAB — BASIC METABOLIC PANEL
BUN: 16
BUN: 19
BUN: 20
CO2: 27
CO2: 30
CO2: 31
CO2: 33 — ABNORMAL HIGH
Calcium: 8.3 — ABNORMAL LOW
Calcium: 8.3 — ABNORMAL LOW
Calcium: 8.4
Calcium: 8.6
Chloride: 98
Creatinine, Ser: 0.98
Creatinine, Ser: 1.02
Creatinine, Ser: 1.04
GFR calc Af Amer: >60
GFR calc Af Amer: >60
GFR calc Af Amer: >60
GFR calc non Af Amer: >60
GFR calc non Af Amer: >60
Glucose, Bld: 101 — ABNORMAL HIGH
Glucose, Bld: 156 — ABNORMAL HIGH
Sodium: 137

## 2011-07-12 LAB — CARDIAC PANEL(CRET KIN+CKTOT+MB+TROPI): Relative Index: INVALID

## 2011-08-04 ENCOUNTER — Other Ambulatory Visit: Payer: Self-pay | Admitting: Pulmonary Disease

## 2011-08-05 ENCOUNTER — Telehealth: Payer: Self-pay | Admitting: Pulmonary Disease

## 2011-08-05 MED ORDER — BUDESONIDE-FORMOTEROL FUMARATE 160-4.5 MCG/ACT IN AERO: 2.0000 | INHALATION_SPRAY | Freq: Two times a day (BID) | RESPIRATORY_TRACT | Status: DC

## 2011-08-05 NOTE — Telephone Encounter (Signed)
Pt is a patient of DS.  Was last seen in June and given sample of Symbicort and told to f/u in 4 weeks but no appt was ever made.  phone note from July states Symbicort helped him and rx was sent to pharmacy.  Pt requesting refill.

## 2011-08-05 NOTE — Telephone Encounter (Signed)
Spoke with TD on this pt.  DS no longer sees pt's in the office and therefore pt will need to choose a new physician.    Called and spoke with pt and after a lengthy 16 min conversation explaining our protocol, pt agreed to see Dr. Kendrick Fries in [facility] for a pulm consult on 11/27 at 1:30 pm

## 2011-08-15 ENCOUNTER — Other Ambulatory Visit: Payer: Self-pay | Admitting: *Deleted

## 2011-08-15 MED ORDER — ZOLPIDEM TARTRATE 10 MG PO TABS: 10.0000 mg | ORAL_TABLET | Freq: Every evening | ORAL | Status: DC | PRN

## 2011-08-15 NOTE — Telephone Encounter (Signed)
rx called in

## 2011-09-13 ENCOUNTER — Encounter: Payer: Self-pay | Admitting: Pulmonary Disease

## 2011-09-13 ENCOUNTER — Ambulatory Visit (INDEPENDENT_AMBULATORY_CARE_PROVIDER_SITE_OTHER): Payer: Medicare Other | Admitting: Pulmonary Disease

## 2011-09-13 VITALS — BP 118/80 | HR 80 | Temp 97.7°F | Ht 75.0 in | Wt 272.0 lb

## 2011-09-13 DIAGNOSIS — J449 Chronic obstructive pulmonary disease, unspecified: Secondary | ICD-10-CM

## 2011-09-13 MED ORDER — BUDESONIDE-FORMOTEROL FUMARATE 160-4.5 MCG/ACT IN AERO: 2.0000 | INHALATION_SPRAY | Freq: Two times a day (BID) | RESPIRATORY_TRACT | Status: DC

## 2011-09-13 MED ORDER — ALBUTEROL SULFATE HFA 108 (90 BASE) MCG/ACT IN AERS: 2.0000 | INHALATION_SPRAY | RESPIRATORY_TRACT | Status: DC | PRN

## 2011-09-13 NOTE — Assessment & Plan Note (Signed)
Severe COPD, symptoms improved somewhat with symbicort. COPD: GOLD Stage IV Combined recommendations from the [employer]	[place], [employer], Designer, television/film set, European Respiratory Society (Qaseem A et al, Ann Intern Med. 2011;155(3):179) recommends tobacco cessation, pulmonary rehab (for symptomatic patients with an FEV1 < 50% predicted), supplemental oxygen (for patients with SaO2 <88% or paO2 <55), and appropriate bronchodilator therapy.  In regards to long acting bronchodilators, they recommend monotherapy (FEV1 60-80% with symptoms weak evidence, FEV1 with symptoms <60% strong evidence), or combination therapy (FEV1 <60% with symptoms, strong recommendation, moderate evidence).  One should also provide patients with annual immunizations and consider therapy for prevention of COPD exacerbations (ie. roflumilast or azithromycin) when appopriate.  -O2 therapy: not indicated -Immunizations:  -Tobacco use: quit 2012 -Exercise: discussed at length today.  Advised pulm rehab for extensive period of time but he is not willing.  Discussed payment options through medicare part B, but he is still not willing because he thinks he can do this himself.  I advised him that he has gained 20 lbs since his last visit in 03/2012 but he is still not willing to do anything other than his treadmill at home. -Bronchodilator therapy: symbicort and albuterol -Exacerbation prevention: not indicated

## 2011-09-13 NOTE — Patient Instructions (Signed)
We have refilled your albuterol and symbicort for six months.  We recommend that you exercise and diet to lose weight.  Your weight goal should be 225lbs.  We will give you a referral for pulmonary rehab if you want it.  We will see you back in 6 months.

## 2011-09-13 NOTE — Progress Notes (Signed)
Subjective:    Patient ID: [name], male    DOB: [birth year], 70 y.o.   MRN: [id number]  HPI 03/30/2011 H&P ([place]) -- Severe COPD:  34 yowm former patient of Dr Shelle Iron last seen approx one year ago for COPD/emphysema. He has been tried on multiple inhaled medications including Advair, Symbicort and Spiriva all with no discernible benefit.  Reportedly told by Dr Shelle Iron that there was nothing more that can be done. Presents again for "second opinion" and reports continued severe DOE (class III symptoms). He has episodes of panic assoc with the dyspnea and has been prescribed Xanax by his primary MD.  Has stopped smoking since last seen by Dr Shelle Iron (quit 5 mos ago). Denies exertional CP but does have occasional epigastric pain. No history of hemoptysis, LE edema or calf tenderness. Intermittent cough is mostly NP.  09/13/11 ROV Severe COPD:  Returns today, "to have you refill my symbicort and albuterol".  He says that these meds have made a big difference in his shortness of breath.  He still uses albuterol about twice per week for dyspnea, mostly from exertion.  He does not have environmental triggers.  He has a rare cough productive of scant clear sputum.  He has not started to exercise and is adamant that there is no benefit to pulmonary rehab.  He has gained weight since our last visit.  Past Medical History  Diagnosis Date  . Allergic rhinitis, cause unspecified   . Other and unspecified hyperlipidemia   . Acute myocardial infarction, unspecified site, episode of care unspecified   . Unspecified essential hypertension      Family History  Problem Relation Age of Onset  . Emphysema Mother   . Heart disease Mother   . ALS Father      History   Social History  . Marital Status: Married    Spouse Name: N/A    Number of Children: N/A  . Years of Education: N/A   Occupational History  . Retired    Social History Main Topics  . Smoking status: Former Smoker -- 2.0  packs/day for 50 years    Types: Cigarettes    Quit date: 11/18/2009  . Smokeless tobacco: Never Used  . Alcohol Use: No  . Drug Use: No  . Sexually Active: Not on file   Other Topics Concern  . Not on file   Social History Narrative  . No narrative on file     Allergies  Allergen Reactions  . Penicillins      Outpatient Prescriptions Prior to Visit  Medication Sig Dispense Refill  . ALPRAZolam (XANAX) 0.25 MG tablet Take 1 tablet (0.25 mg total) by mouth 3 (three) times daily as needed for anxiety.  90 tablet  0  . aspirin 81 MG tablet Take 81 mg by mouth daily.        . citalopram (CELEXA) 10 MG tablet Take 1 tablet (10 mg total) by mouth daily.  90 tablet  3  . folic acid (FOLVITE) 1 MG tablet Take 1 mg by mouth daily.        Marland Kitchen HYDROcodone-acetaminophen (VICODIN) 5-500 MG per tablet Take 1 tablet by mouth every 4 (four) hours as needed.  180 tablet  0  . Lancets (FREESTYLE) lancets To use with glucometer of choice.  Patient checks blood sugar up to twice daily  100 each  11  . metFORMIN (GLUCOPHAGE) 500 MG tablet Take 500 mg by mouth 2 (two) times daily with a meal.        .  metoprolol succinate (TOPROL-XL) 25 MG 24 hr tablet Take 25 mg by mouth 2 (two) times daily.       Marland Kitchen omeprazole (PRILOSEC) 40 MG capsule Take 40 mg by mouth 2 (two) times daily.       . simvastatin (ZOCOR) 10 MG tablet Take 10 mg by mouth at bedtime.        Marland Kitchen zolpidem (AMBIEN) 10 MG tablet Take 1 tablet (10 mg total) by mouth at bedtime as needed.  30 tablet  3  . albuterol (PROAIR HFA) 108 (90 BASE) MCG/ACT inhaler Inhale 2 puffs into the lungs every 4 (four) hours as needed.  1 Inhaler  11  . budesonide-formoterol (SYMBICORT) 160-4.5 MCG/ACT inhaler Inhale 2 puffs into the lungs 2 (two) times daily.  1 Inhaler  2  . glucose monitoring kit (FREESTYLE) monitoring kit 1 each by Does not apply route as needed for other. Please allow patient to chose the glucometer of choice depending on which one his insurance  will cover.  1 each  0  . Blood Gluc Meter Disp-Strips (BLOOD GLUCOSE METER DISPOSABLE) DEVI To use with glucometer, patient test blood glucose up to twice daily  100 each  11  . hydrochlorothiazide 25 MG tablet Take 25 mg by mouth daily.           Review of Systems  Constitutional: Negative for fever, chills, activity change, appetite change and unexpected weight change.  HENT: Negative for congestion, sore throat, rhinorrhea, sneezing, trouble swallowing, dental problem, voice change and postnasal drip.   Eyes: Negative for visual disturbance.  Respiratory: Positive for shortness of breath. Negative for cough and choking.   Cardiovascular: Positive for chest pain. Negative for leg swelling.  Gastrointestinal: Negative for nausea, vomiting and abdominal pain.  Genitourinary: Negative for difficulty urinating.  Musculoskeletal: Negative for arthralgias.  Skin: Negative for rash.  Psychiatric/Behavioral: Negative for behavioral problems and confusion.       Objective:   Physical Exam  Filed Vitals:   09/13/11 1334  BP: 118/80  Pulse: 80  Temp: 97.7 F (36.5 C)  TempSrc: Oral  Height: 6\' 3"  (1.905 m)  Weight: 272 lb (123.378 kg)  SpO2: 95%   Gen: chronically ill appearing, no acute distress HEENT: NCAT, PERRL, EOMi, OP clear, neck supple without masses PULM: Scattered wheezes, no respiratory distress CV: RRR, no mgr, no JVD AB: BS+, soft, nontender, no hsm Ext: warm, trace edema, no clubbing, no cyanosis Derm: no rash or skin breakdown Neuro: A&Ox4, CN II-XII intact, strength 5/5 in all 4 extremeties    Assessment & Plan:   COPD (chronic obstructive pulmonary disease) Severe COPD, symptoms improved somewhat with symbicort. COPD: GOLD Stage IV Combined recommendations from the [employer]	[place], [employer], Designer, television/film set, European Respiratory Society (Qaseem A et al, Ann Intern Med. 2011;155(3):179) recommends tobacco  cessation, pulmonary rehab (for symptomatic patients with an FEV1 < 50% predicted), supplemental oxygen (for patients with SaO2 <88% or paO2 <55), and appropriate bronchodilator therapy.  In regards to long acting bronchodilators, they recommend monotherapy (FEV1 60-80% with symptoms weak evidence, FEV1 with symptoms <60% strong evidence), or combination therapy (FEV1 <60% with symptoms, strong recommendation, moderate evidence).  One should also provide patients with annual immunizations and consider therapy for prevention of COPD exacerbations (ie. roflumilast or azithromycin) when appopriate.  -O2 therapy: not indicated -Immunizations:  -Tobacco use: quit 2012 -Exercise: discussed at length today.  Advised pulm rehab for extensive period of time but he is  not willing.  Discussed payment options through medicare part B, but he is still not willing because he thinks he can do this himself.  I advised him that he has gained 20 lbs since his last visit in 03/2012 but he is still not willing to do anything other than his treadmill at home. -Bronchodilator therapy: symbicort and albuterol -Exacerbation prevention: not indicated    Updated Medication List Outpatient Encounter Prescriptions as of 09/13/2011  Medication Sig Dispense Refill  . albuterol (PROAIR HFA) 108 (90 BASE) MCG/ACT inhaler Inhale 2 puffs into the lungs every 4 (four) hours as needed.  1 Inhaler  11  . ALPRAZolam (XANAX) 0.25 MG tablet Take 1 tablet (0.25 mg total) by mouth 3 (three) times daily as needed for anxiety.  90 tablet  0  . aspirin 81 MG tablet Take 81 mg by mouth daily.        . budesonide-formoterol (SYMBICORT) 160-4.5 MCG/ACT inhaler Inhale 2 puffs into the lungs 2 (two) times daily.  1 Inhaler  5  . citalopram (CELEXA) 10 MG tablet Take 1 tablet (10 mg total) by mouth daily.  90 tablet  3  . folic acid (FOLVITE) 1 MG tablet Take 1 mg by mouth daily.        Marland Kitchen HYDROcodone-acetaminophen (VICODIN) 5-500 MG per tablet  Take 1 tablet by mouth every 4 (four) hours as needed.  180 tablet  0  . Lancets (FREESTYLE) lancets To use with glucometer of choice.  Patient checks blood sugar up to twice daily  100 each  11  . metFORMIN (GLUCOPHAGE) 500 MG tablet Take 500 mg by mouth 2 (two) times daily with a meal.        . metoprolol succinate (TOPROL-XL) 25 MG 24 hr tablet Take 25 mg by mouth 2 (two) times daily.       Marland Kitchen omeprazole (PRILOSEC) 40 MG capsule Take 40 mg by mouth 2 (two) times daily.       . polysaccharide iron (NIFEREX) 150 MG CAPS capsule Take 150 mg by mouth 2 (two) times daily.        . simvastatin (ZOCOR) 10 MG tablet Take 10 mg by mouth at bedtime.        Marland Kitchen zolpidem (AMBIEN) 10 MG tablet Take 1 tablet (10 mg total) by mouth at bedtime as needed.  30 tablet  3  . DISCONTD: albuterol (PROAIR HFA) 108 (90 BASE) MCG/ACT inhaler Inhale 2 puffs into the lungs every 4 (four) hours as needed.  1 Inhaler  11  . DISCONTD: budesonide-formoterol (SYMBICORT) 160-4.5 MCG/ACT inhaler Inhale 2 puffs into the lungs 2 (two) times daily.  1 Inhaler  2  . DISCONTD: budesonide-formoterol (SYMBICORT) 160-4.5 MCG/ACT inhaler Inhale 2 puffs into the lungs 2 (two) times daily.  1 Inhaler  2  . DISCONTD: glucose monitoring kit (FREESTYLE) monitoring kit 1 each by Does not apply route as needed for other. Please allow patient to chose the glucometer of choice depending on which one his insurance will cover.  1 each  0  . DISCONTD: Blood Gluc Meter Disp-Strips (BLOOD GLUCOSE METER DISPOSABLE) DEVI To use with glucometer, patient test blood glucose up to twice daily  100 each  11  . DISCONTD: hydrochlorothiazide 25 MG tablet Take 25 mg by mouth daily.

## 2011-09-14 ENCOUNTER — Other Ambulatory Visit: Payer: Self-pay | Admitting: *Deleted

## 2011-09-14 MED ORDER — ZOLPIDEM TARTRATE 10 MG PO TABS: 10.0000 mg | ORAL_TABLET | Freq: Every evening | ORAL | Status: DC | PRN

## 2011-09-14 NOTE — Telephone Encounter (Signed)
Pt called very upset, he is out of his [place], pharmacy will not refill until tomorrow. He states this always happens when there are 31 days in a month. He wants an rx called in so he can pick up today, he states he cannot get any sleep without it.

## 2011-09-15 NOTE — Telephone Encounter (Signed)
[facility], patient advised via telephone.

## 2011-09-21 ENCOUNTER — Telehealth: Payer: Self-pay | Admitting: Internal Medicine

## 2011-09-21 NOTE — Telephone Encounter (Signed)
Yes ok to receive them together but please give in different arms.

## 2011-09-21 NOTE — Telephone Encounter (Signed)
Patient called and stated he would like to get the pneumonia and flu shot.  Please advise.

## 2011-09-22 ENCOUNTER — Ambulatory Visit (INDEPENDENT_AMBULATORY_CARE_PROVIDER_SITE_OTHER): Payer: Medicare Other | Admitting: *Deleted

## 2011-09-22 DIAGNOSIS — Z23 Encounter for immunization: Secondary | ICD-10-CM

## 2011-09-22 MED ORDER — HYDROCODONE-ACETAMINOPHEN 5-500 MG PO TABS: 1.0000 | ORAL_TABLET | ORAL | Status: DC | PRN

## 2011-09-22 NOTE — Telephone Encounter (Signed)
Rx printed

## 2011-09-22 NOTE — Telephone Encounter (Signed)
I will give patient Rx when he comes in for nurse visit today.

## 2011-09-22 NOTE — Telephone Encounter (Signed)
Patient coming in today for flu shot and pneumonia shot.  He is also requesting a Rx for Hydrocodone to pick up during nurse visit today.  Please advise.

## 2011-09-23 ENCOUNTER — Telehealth: Payer: Self-pay | Admitting: Internal Medicine

## 2011-09-23 NOTE — Telephone Encounter (Signed)
This can occur with any injection.  If there is no redness or breathing symptoms like wheezing or SOB- ok to apply heat, use NSAIDs and give it time. If there are concerns beyond a mild injection site reaction, needs to be seen in work in clinic tomorrow or urgent care this afternoon.

## 2011-09-23 NOTE — Telephone Encounter (Signed)
Patient wife called and stated where he received his pneumonia shot 3 days ago  the whole great muscle is hard and swollen.  Please advise.

## 2011-09-23 NOTE — Telephone Encounter (Signed)
Patient advised as instructed via telephone.  He has been applying heat but has no SOB or wheezing.  He stated that he will start to exercise and move his arm around and if he develops a fever, SOB, or wheezing over the weekend he will go to Urgent Care or the ER.  He will let us know how he is doing on Monday.

## 2011-10-03 ENCOUNTER — Telehealth: Payer: Self-pay | Admitting: *Deleted

## 2011-10-03 NOTE — Telephone Encounter (Signed)
Patient calling asking if he can take more than 1 per day of Celexa? He states his cat of 15years died which makes him depressed and when all the children in [place] was killed makes him depressed, he states he's SOB and chest pain from this. I asked if he needed to go to the ER he stated no, he just thought taking more of the Celexa might help. Please advise

## 2011-10-03 NOTE — Telephone Encounter (Signed)
Yes ok to to increase to 20 mg daily. Please keep [place] posted.

## 2011-10-04 NOTE — Telephone Encounter (Signed)
Patient advised as instructed via telephone. 

## 2011-11-16 ENCOUNTER — Telehealth: Payer: Self-pay | Admitting: Pulmonary Disease

## 2011-11-16 NOTE — Telephone Encounter (Signed)
Spoke with patient-he is aware that Verlon Au has the paper for [place] to sign on Monday 11-21-2011; pt understands this. Will forward message to [place] and Verlon Au as a reminder.

## 2011-11-23 NOTE — Telephone Encounter (Signed)
[name], did Dr. Kendrick Fries sign form patient is calling about. Pls advsie.

## 2011-11-23 NOTE — Telephone Encounter (Signed)
Patient calling back about bed.

## 2011-11-23 NOTE — Telephone Encounter (Signed)
I spoke with [name] and she states that per BQ this needs to go through his pcp. Pt was not happy with this and wants to know why he won't sign this. Pt states this bed is for him to help with his breathing and stated this should not have to be taken care of by his pcp. Please advise Dr. Kendrick Fries thanks

## 2011-11-24 NOTE — Telephone Encounter (Signed)
I don't recall anything from our office visit that made me think that he needs a hospital bed for his breathing.  He needs to speak with his PCP.

## 2011-11-25 NOTE — Telephone Encounter (Signed)
I spoke with pt and he stated his pcp did sign off for him to get a bed. Pt is requesting that Dr. Kendrick Fries call and speak with him bc he stated "I am not happy at all with this". He stated "what's the point of me having a lung doctor if he can't help me". Pt had stated many other complaints w/o being nice about it. Pt continued to be argumenative. Will give to jules to help with this. Message has been printed off and given to jules.

## 2011-12-06 ENCOUNTER — Other Ambulatory Visit: Payer: Self-pay | Admitting: *Deleted

## 2011-12-06 MED ORDER — ALPRAZOLAM 0.25 MG PO TABS: 0.2500 mg | ORAL_TABLET | Freq: Three times a day (TID) | ORAL | Status: DC | PRN

## 2011-12-06 NOTE — Telephone Encounter (Signed)
Rx called to pharmacy

## 2011-12-06 NOTE — Telephone Encounter (Signed)
plz phone in. 

## 2011-12-06 NOTE — Telephone Encounter (Signed)
Okay to refill? 

## 2011-12-07 ENCOUNTER — Ambulatory Visit (INDEPENDENT_AMBULATORY_CARE_PROVIDER_SITE_OTHER): Payer: Medicare Other | Admitting: Family Medicine

## 2011-12-07 ENCOUNTER — Encounter: Payer: Self-pay | Admitting: Family Medicine

## 2011-12-07 DIAGNOSIS — J441 Chronic obstructive pulmonary disease with (acute) exacerbation: Secondary | ICD-10-CM

## 2011-12-07 DIAGNOSIS — J209 Acute bronchitis, unspecified: Secondary | ICD-10-CM

## 2011-12-07 MED ORDER — IPRATROPIUM-ALBUTEROL 18-103 MCG/ACT IN AERO: 2.0000 | INHALATION_SPRAY | Freq: Four times a day (QID) | RESPIRATORY_TRACT | Status: DC | PRN

## 2011-12-07 MED ORDER — PREDNISONE 20 MG PO TABS: ORAL_TABLET | ORAL | Status: DC

## 2011-12-07 MED ORDER — LEVOFLOXACIN 500 MG PO TABS: 500.0000 mg | ORAL_TABLET | Freq: Every day | ORAL | Status: AC

## 2011-12-07 NOTE — Patient Instructions (Signed)
Can use plain GUAIFENESIN OVER THE COUNTER

## 2011-12-07 NOTE — Progress Notes (Signed)
  Patient Name: [name] Date of Birth: 28-Mar-1941 Age: 71 y.o. Medical Record Number: [id number] Gender: male Date of Encounter: 12/07/2011  History of Present Illness:  [name] is a 71 y.o. very pleasant male patient who presents with the following:  Has been coughing for about 10 days, severe COPD. Now really hurting his chest and sides are hurting bad. Has been taking cough syrup.   61.  71 year old gentleman who appears over than her stated age with severe chronic obstructive pulmonary disease, with a ten-day history of cough productive of sputum. Nose chest is hurting him quite a bit. Size are hurting. He has been taking some cough medication without any significant improvement in his symptoms. He is currently afebrile. He has had some occasional aching and chills.  No significant sore throat, earache, but he has had some vomiting due to heavy coughing. He reports compliance with his medication. COPD.  Past Medical History, Surgical History, Social History, Family History, Problem List, Medications, and Allergies have been reviewed and updated if relevant.  Review of Systems: ROS: GEN: Acute illness details above GI: Tolerating PO intake GU: maintaining adequate hydration and urination Pulm: No SOB Interactive and getting along well at home.  Otherwise, ROS is as per the HPI.   Physical Examination: Filed Vitals:   12/07/11 1032  BP: 130/70  Pulse: 90  Temp: 97.7 F (36.5 C)  TempSrc: Oral  Height: 6\' 2"  (1.88 m)  Weight: 272 lb 12.8 oz (123.741 kg)  SpO2: 92%    Body mass index is 35.03 kg/(m^2).   GEN: A and O x 3. WDWN. NAD.    ENT: Nose clear, ext NML.  No LAD.  No JVD.  TM's clear. Oropharynx clear.  PULM: Normal WOB, no distress. Scattered rhonchorous sounds CV: RRR, no M/G/R, No rubs, No JVD.   EXT: warm and well-perfused, No c/c/e. PSYCH: Pleasant and conversant.   Assessment and Plan: 1. COPD exacerbation  albuterol-ipratropium  (COMBIVENT) 18-103 MCG/ACT inhaler, levofloxacin (LEVAQUIN) 500 MG tablet, predniSONE (DELTASONE) 20 MG tablet  2. Bronchitis, acute  albuterol-ipratropium (COMBIVENT) 18-103 MCG/ACT inhaler, levofloxacin (LEVAQUIN) 500 MG tablet, predniSONE (DELTASONE) 20 MG tablet    Acute bronchitis: discussed plan of care. Given length of symptoms and overall history, will treat with ABX in this case. Continue with additional supportive care, cough medications, liquids, sleep, steam / vaporizer.  Also with COPD exac -- pulse ox at 92 on RA Stable for outpatient management.

## 2011-12-15 ENCOUNTER — Telehealth: Payer: Self-pay | Admitting: *Deleted

## 2011-12-15 NOTE — Telephone Encounter (Signed)
Advised pt to go to ER for evaluation, per Dr. Dayton Martes.

## 2011-12-15 NOTE — Telephone Encounter (Signed)
Pt's wife called to report that pt isnt any better since his office visit on 2/20.  He will take his last levaquin today and his wife is concerned because he is so sick with his COPD.  I then spoke with patient and he also said he isn't any better.  He says he almost went to the hospital last night because he feels so bad, feels short of breath, which he says he always has but it has gotten worse with his congestion and wheezing from this infection.  [facility]

## 2011-12-15 NOTE — Telephone Encounter (Signed)
Reviewed Dr. Cyndie Chime note Was given prednsione and levaquin which are both quite strong and effective. If he is feeling that bad, he needs to go the ER to be evaluated.

## 2011-12-19 ENCOUNTER — Ambulatory Visit: Payer: Medicare Other | Admitting: Family Medicine

## 2011-12-21 NOTE — Telephone Encounter (Signed)
[name], is there anything we can do for this patient or has this been taken care of? Pls advise.

## 2012-01-02 ENCOUNTER — Other Ambulatory Visit: Payer: Self-pay | Admitting: *Deleted

## 2012-01-02 MED ORDER — HYDROCODONE-ACETAMINOPHEN 5-500 MG PO TABS: 1.0000 | ORAL_TABLET | ORAL | Status: DC | PRN

## 2012-01-02 NOTE — Telephone Encounter (Signed)
[facility] 

## 2012-01-02 NOTE — Telephone Encounter (Signed)
Faxed refill request from [facility], last filled 09/22/11.

## 2012-01-11 ENCOUNTER — Telehealth: Payer: Self-pay | Admitting: *Deleted

## 2012-01-11 MED ORDER — METFORMIN HCL 500 MG PO TABS: 500.0000 mg | ORAL_TABLET | Freq: Two times a day (BID) | ORAL | Status: DC

## 2012-01-11 NOTE — Telephone Encounter (Signed)
Message copied by Sueanne Margarita on Wed Jan 11, 2012  5:16 PM ------      Message from: Ulice Bold      Created: Wed Jan 11, 2012  5:01 PM      Contact: [place]/Spouse       Mrs. Ganaway has had a difficult time reaching the office today andis trying to get her husband's metformin filled through [facility].  Meanwhile, Mr. Vannice does not enough metformin left to last him the week and needs to have at least a 2 week supply [name] [address] [facility], [phone] until he receives a supply through [facility].

## 2012-01-11 NOTE — Telephone Encounter (Signed)
[facility].  Please apologize to Ms. Sausedo.

## 2012-01-12 ENCOUNTER — Other Ambulatory Visit: Payer: Self-pay | Admitting: *Deleted

## 2012-01-12 MED ORDER — ALPRAZOLAM 0.25 MG PO TABS: 0.2500 mg | ORAL_TABLET | Freq: Three times a day (TID) | ORAL | Status: DC | PRN

## 2012-01-12 MED ORDER — ZOLPIDEM TARTRATE 10 MG PO TABS: 10.0000 mg | ORAL_TABLET | Freq: Every evening | ORAL | Status: DC | PRN

## 2012-01-12 MED ORDER — HYDROCODONE-ACETAMINOPHEN 5-500 MG PO TABS: 1.0000 | ORAL_TABLET | ORAL | Status: DC | PRN

## 2012-01-12 NOTE — Telephone Encounter (Signed)
Advised patient and apologies given.

## 2012-01-12 NOTE — Telephone Encounter (Signed)
Medicines called to pharmacy. 

## 2012-01-12 NOTE — Telephone Encounter (Signed)
Faxed refill requests from [facility].  Pt is requesting 90 day supplies, if possible.

## 2012-02-06 ENCOUNTER — Encounter: Payer: Self-pay | Admitting: *Deleted

## 2012-02-06 ENCOUNTER — Telehealth: Payer: Self-pay | Admitting: Pulmonary Disease

## 2012-02-06 NOTE — Telephone Encounter (Signed)
LMTCB for [employer]-? Why the switch

## 2012-02-07 NOTE — Telephone Encounter (Signed)
[name] at [facility] returning [name]'s call can be reached at [phone].[name]

## 2012-02-07 NOTE — Telephone Encounter (Signed)
Called, spoke with Joyce Gross who states pt does not want to go back to the [place] office for follow ups.  Per Joyce Gross, pt states he would like to be seen in the [place] office.  Joyce Gross states pt does not have a preference on who he sees in the [place] office as long as it's not Dr. Shelle Iron -- whoever has the first available, other than [facility], will be fine.  Advise Joyce Gross of office protocol on switching drs.  She is ok with this and aware we will call her back once this has been address.  Dr. Kendrick Fries, pls advise if you are ok with this.  Thank you.

## 2012-02-07 NOTE — Telephone Encounter (Signed)
No problem.

## 2012-02-08 NOTE — Telephone Encounter (Signed)
Declined, will need to see [place] doc or be referred to [facility] by his primary

## 2012-02-08 NOTE — Telephone Encounter (Signed)
Dr. Sherene Sires will you be willing to accept pt as a new pt. Please advise thanks

## 2012-02-08 NOTE — Telephone Encounter (Signed)
I spoke with Joyce Gross and made her aware of MW recs. She voiced her understanding and states she is going to call the pt and let him know this that we did not need to call him. Will sign off message

## 2012-03-26 ENCOUNTER — Encounter (HOSPITAL_COMMUNITY): Payer: Self-pay | Admitting: Emergency Medicine

## 2012-03-26 ENCOUNTER — Inpatient Hospital Stay (HOSPITAL_COMMUNITY)
Admission: EM | Admit: 2012-03-26 | Discharge: 2012-03-28 | DRG: 377 | Disposition: A | Discharge status: Home / Self Care | Payer: Medicare Other | Attending: Internal Medicine | Admitting: Internal Medicine

## 2012-03-26 ENCOUNTER — Emergency Department (HOSPITAL_COMMUNITY): Payer: Medicare Other

## 2012-03-26 DIAGNOSIS — Z880 Allergy status to penicillin: Secondary | ICD-10-CM

## 2012-03-26 DIAGNOSIS — J9620 Acute and chronic respiratory failure, unspecified whether with hypoxia or hypercapnia: Secondary | ICD-10-CM | POA: Diagnosis present

## 2012-03-26 DIAGNOSIS — K921 Melena: Secondary | ICD-10-CM

## 2012-03-26 DIAGNOSIS — Z951 Presence of aortocoronary bypass graft: Secondary | ICD-10-CM

## 2012-03-26 DIAGNOSIS — K922 Gastrointestinal hemorrhage, unspecified: Secondary | ICD-10-CM

## 2012-03-26 DIAGNOSIS — J209 Acute bronchitis, unspecified: Secondary | ICD-10-CM

## 2012-03-26 DIAGNOSIS — I10 Essential (primary) hypertension: Secondary | ICD-10-CM | POA: Diagnosis present

## 2012-03-26 DIAGNOSIS — I2510 Atherosclerotic heart disease of native coronary artery without angina pectoris: Secondary | ICD-10-CM | POA: Diagnosis present

## 2012-03-26 DIAGNOSIS — I5032 Chronic diastolic (congestive) heart failure: Secondary | ICD-10-CM | POA: Diagnosis present

## 2012-03-26 DIAGNOSIS — H6120 Impacted cerumen, unspecified ear: Secondary | ICD-10-CM

## 2012-03-26 DIAGNOSIS — E119 Type 2 diabetes mellitus without complications: Secondary | ICD-10-CM | POA: Diagnosis present

## 2012-03-26 DIAGNOSIS — K31811 Angiodysplasia of stomach and duodenum with bleeding: Principal | ICD-10-CM | POA: Diagnosis present

## 2012-03-26 DIAGNOSIS — J438 Other emphysema: Secondary | ICD-10-CM

## 2012-03-26 DIAGNOSIS — D72829 Elevated white blood cell count, unspecified: Secondary | ICD-10-CM

## 2012-03-26 DIAGNOSIS — I509 Heart failure, unspecified: Secondary | ICD-10-CM | POA: Diagnosis present

## 2012-03-26 DIAGNOSIS — Z9981 Dependence on supplemental oxygen: Secondary | ICD-10-CM

## 2012-03-26 DIAGNOSIS — I219 Acute myocardial infarction, unspecified: Secondary | ICD-10-CM

## 2012-03-26 DIAGNOSIS — J441 Chronic obstructive pulmonary disease with (acute) exacerbation: Secondary | ICD-10-CM

## 2012-03-26 DIAGNOSIS — R0600 Dyspnea, unspecified: Secondary | ICD-10-CM

## 2012-03-26 DIAGNOSIS — I517 Cardiomegaly: Secondary | ICD-10-CM | POA: Diagnosis present

## 2012-03-26 DIAGNOSIS — D62 Acute posthemorrhagic anemia: Secondary | ICD-10-CM | POA: Diagnosis present

## 2012-03-26 DIAGNOSIS — I499 Cardiac arrhythmia, unspecified: Secondary | ICD-10-CM

## 2012-03-26 DIAGNOSIS — D1330 Benign neoplasm of unspecified part of small intestine: Secondary | ICD-10-CM | POA: Diagnosis present

## 2012-03-26 DIAGNOSIS — D649 Anemia, unspecified: Secondary | ICD-10-CM

## 2012-03-26 DIAGNOSIS — J4489 Other specified chronic obstructive pulmonary disease: Secondary | ICD-10-CM | POA: Diagnosis present

## 2012-03-26 DIAGNOSIS — I5030 Unspecified diastolic (congestive) heart failure: Secondary | ICD-10-CM

## 2012-03-26 DIAGNOSIS — Z79899 Other long term (current) drug therapy: Secondary | ICD-10-CM

## 2012-03-26 DIAGNOSIS — Z87891 Personal history of nicotine dependence: Secondary | ICD-10-CM

## 2012-03-26 DIAGNOSIS — I252 Old myocardial infarction: Secondary | ICD-10-CM

## 2012-03-26 DIAGNOSIS — J449 Chronic obstructive pulmonary disease, unspecified: Secondary | ICD-10-CM | POA: Diagnosis present

## 2012-03-26 DIAGNOSIS — J309 Allergic rhinitis, unspecified: Secondary | ICD-10-CM

## 2012-03-26 DIAGNOSIS — E785 Hyperlipidemia, unspecified: Secondary | ICD-10-CM | POA: Diagnosis present

## 2012-03-26 HISTORY — DX: Abdominal aortic aneurysm, without rupture: I71.4

## 2012-03-26 HISTORY — DX: Abdominal aortic aneurysm, without rupture, unspecified: I71.40

## 2012-03-26 HISTORY — DX: Anemia, unspecified: D64.9

## 2012-03-26 HISTORY — DX: Chronic obstructive pulmonary disease, unspecified: J44.9

## 2012-03-26 MED ORDER — ALBUTEROL SULFATE (5 MG/ML) 0.5% IN NEBU
5.0000 mg | INHALATION_SOLUTION | Freq: Once | RESPIRATORY_TRACT | Status: AC
  Administered 2012-03-26: 5 mg via RESPIRATORY_TRACT
  Filled 2012-03-26: qty 1

## 2012-03-26 MED ORDER — METHYLPREDNISOLONE SODIUM SUCC 125 MG IJ SOLR
125.0000 mg | Freq: Once | INTRAMUSCULAR | Status: AC
  Administered 2012-03-27: 125 mg via INTRAVENOUS
  Filled 2012-03-26: qty 2

## 2012-03-26 MED ORDER — IPRATROPIUM BROMIDE 0.02 % IN SOLN
0.5000 mg | Freq: Once | RESPIRATORY_TRACT | Status: AC
  Administered 2012-03-26: 0.5 mg via RESPIRATORY_TRACT
  Filled 2012-03-26: qty 2.5

## 2012-03-26 NOTE — ED Provider Notes (Signed)
History     CSN: [id number]  Arrival date & time 03/26/12  2243   First MD Initiated Contact with Patient 03/26/12 2326      Chief Complaint  Patient presents with  . 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 States he was started on home Oxygen about 2 weeks ago for same but does not wear it at all times. He states over the last several days he has noticed progressively worsening dyspnea with exertion while walking around his house. States this evening it got particularly severe but improved with rest and with applying his home oxygen. He denies any CP, no cough or fever. No weight gain or leg swelling.   Past Medical History  Diagnosis Date  . Allergic rhinitis, cause unspecified   . Other and unspecified hyperlipidemia   . Acute myocardial infarction, unspecified site, episode of care unspecified   . Unspecified essential hypertension     Past Surgical History  Procedure Date  . Coronary artery bypass graft   . Tonsillectomy   . Elbow surgery     Family History  Problem Relation Age of Onset  . Emphysema Mother   . Heart disease Mother   . ALS Father     History  Substance Use Topics  . Smoking status: Former Smoker -- 2.0 packs/day for 50 years    Types: Cigarettes    Quit date: 11/18/2009  . Smokeless tobacco: Never Used  . Alcohol Use: No      Review of Systems All other systems reviewed and are negative except as noted in HPI.   Allergies  Penicillins  Home Medications   Current Outpatient Rx  Name Route Sig Dispense Refill  . ALBUTEROL SULFATE HFA 108 (90 BASE) MCG/ACT IN AERS Inhalation Inhale 2 puffs into the lungs every 6 (six) hours as needed. For sob    . IPRATROPIUM-ALBUTEROL 18-103 MCG/ACT IN AERO Inhalation Inhale 2 puffs into the lungs every 6 (six) hours as needed for wheezing. 1 Inhaler 5  . ALPRAZOLAM 0.25 MG PO TABS  Oral Take 1 tablet (0.25 mg total) by mouth 3 (three) times daily as needed for anxiety. 90 tablet 0  . ASPIRIN 81 MG PO TABS Oral Take 81 mg by mouth daily.      . BUDESONIDE-FORMOTEROL FUMARATE 160-4.5 MCG/ACT IN AERO Inhalation Inhale 2 puffs into the lungs 2 (two) times daily. 1 Inhaler 5  . CITALOPRAM HYDROBROMIDE 10 MG PO TABS Oral Take 1 tablet (10 mg total) by mouth daily. 90 tablet 3  . FOLIC ACID 1 MG PO TABS Oral Take 1 mg by mouth daily.      Marland Kitchen HYDROCODONE-ACETAMINOPHEN 5-500 MG PO TABS Oral Take 1 tablet by mouth every 4 (four) hours as needed. 180 tablet 0  . METFORMIN HCL 500 MG PO TABS Oral Take 1 tablet (500 mg total) by mouth 2 (two) times daily with a meal. 180 tablet 3  . METOPROLOL SUCCINATE ER 25 MG PO TB24 Oral Take 25 mg by mouth 2 (two) times daily.     Marland Kitchen OMEPRAZOLE 40 MG PO CPDR Oral Take 40 mg by mouth 2 (two) times daily.     Marland Kitchen POLYSACCHARIDE IRON 150 MG PO CAPS Oral Take 150 mg by mouth 2 (two) times daily.      Marland Kitchen SIMVASTATIN 10 MG PO TABS Oral Take 10 mg by mouth at bedtime.      Marland Kitchen ZOLPIDEM TARTRATE 10 MG PO TABS Oral  Take 1 tablet (10 mg total) by mouth at bedtime as needed. 90 tablet 0    BP 106/63  Pulse 78  Temp(Src) 98.2 F (36.8 C) (Oral)  Resp 18  Ht 6\' 2"  (1.88 m)  Wt 275 lb (124.739 kg)  BMI 35.31 kg/m2  SpO2 100%  Physical Exam  Nursing note and vitals reviewed. Constitutional: He is oriented to person, place, and time. He appears well-developed and well-nourished.  HENT:  Head: Normocephalic and atraumatic.  Eyes: EOM are normal. Pupils are equal, round, and reactive to light.  Neck: Normal range of motion. Neck supple.  Cardiovascular: Normal rate, normal heart sounds and intact distal pulses.   Pulmonary/Chest: Effort normal. No respiratory distress. He has wheezes.       Decreased air movement throughout  Abdominal: Bowel sounds are normal. He exhibits no distension. There is no tenderness.  Musculoskeletal: Normal range of motion. He  exhibits no edema and no tenderness.  Neurological: He is alert and oriented to person, place, and time. He has normal strength. No cranial nerve deficit or sensory deficit.  Skin: Skin is warm and dry. No rash noted.  Psychiatric: He has a normal mood and affect.    ED Course  Procedures (including critical care time)   Labs Reviewed  CBC  DIFFERENTIAL  BASIC METABOLIC PANEL   No results found.   No diagnosis found.    MDM   Date: 03/26/2012  Rate: 76  Rhythm: normal sinus rhythm and premature ventricular contractions (PVC)  QRS Axis: right  Intervals: normal  ST/T Wave abnormalities: normal  Conduction Disutrbances:right bundle branch block  Narrative Interpretation:   Old EKG Reviewed: unchanged   1:53 AM Pt remains comfortable in bed. Hgb is low, has had prior episodes of unexplained anemia requiring blood transfusion. Today he has a heme pos stool. Will begin transfusion for symptomatic anemia, discussed with Hospitalist who will admit.        Lucita Montoya B. Bernette Mayers, MD 03/27/12 4098

## 2012-03-26 NOTE — ED Notes (Signed)
Pt. With history of COPD and emphysema, tried to walk to the bathroom tonight without his home O2 and became SOB.  O2 sats 96-99% on 2 liters.

## 2012-03-26 NOTE — ED Notes (Signed)
Pt. denies history of recent upper respiratory infection.  Pt. Reports he strained for air so bad that he urinated on himself.  Pt. Used Symbicort twice today and ProAire twice today also without relief.

## 2012-03-27 ENCOUNTER — Ambulatory Visit: Payer: Medicare Other | Admitting: Pulmonary Disease

## 2012-03-27 ENCOUNTER — Encounter (HOSPITAL_COMMUNITY)
Admission: EM | Disposition: A | Discharge status: Home / Self Care | Payer: Self-pay | Source: Home / Self Care | Attending: Internal Medicine

## 2012-03-27 ENCOUNTER — Encounter (HOSPITAL_COMMUNITY): Payer: Self-pay | Admitting: *Deleted

## 2012-03-27 DIAGNOSIS — I5032 Chronic diastolic (congestive) heart failure: Secondary | ICD-10-CM | POA: Diagnosis present

## 2012-03-27 DIAGNOSIS — D7289 Other specified disorders of white blood cells: Secondary | ICD-10-CM

## 2012-03-27 DIAGNOSIS — R0600 Dyspnea, unspecified: Secondary | ICD-10-CM | POA: Diagnosis present

## 2012-03-27 DIAGNOSIS — J438 Other emphysema: Secondary | ICD-10-CM

## 2012-03-27 DIAGNOSIS — D72829 Elevated white blood cell count, unspecified: Secondary | ICD-10-CM | POA: Diagnosis present

## 2012-03-27 DIAGNOSIS — K922 Gastrointestinal hemorrhage, unspecified: Secondary | ICD-10-CM

## 2012-03-27 DIAGNOSIS — I5030 Unspecified diastolic (congestive) heart failure: Secondary | ICD-10-CM

## 2012-03-27 HISTORY — PX: ESOPHAGOGASTRODUODENOSCOPY: SHX5428

## 2012-03-27 LAB — GLUCOSE, CAPILLARY

## 2012-03-27 LAB — POCT I-STAT TROPONIN I: Troponin i, poc: 0 ng/mL (ref 0.00–0.08)

## 2012-03-27 LAB — CBC
HCT: 26.3 % — ABNORMAL LOW (ref 39.0–52.0)
Hemoglobin: 7.2 g/dL — ABNORMAL LOW (ref 13.0–17.0)
MCH: 22 pg — ABNORMAL LOW (ref 26.0–34.0)
MCH: 24.1 pg — ABNORMAL LOW (ref 26.0–34.0)
MCHC: 27.4 g/dL — ABNORMAL LOW (ref 30.0–36.0)
Platelets: 274 10*3/uL (ref 150–400)
RBC: 3.28 MIL/uL — ABNORMAL LOW (ref 4.22–5.81)
RBC: 3.69 MIL/uL — ABNORMAL LOW (ref 4.22–5.81)

## 2012-03-27 LAB — DIFFERENTIAL
Basophils Relative: 0 % (ref 0–1)
Eosinophils Absolute: 0.1 10*3/uL (ref 0.0–0.7)
Eosinophils Relative: 1 % (ref 0–5)
Lymphocytes Relative: 14 % (ref 12–46)
Neutro Abs: 9.9 10*3/uL — ABNORMAL HIGH (ref 1.7–7.7)
Neutrophils Relative %: 77 % (ref 43–77)

## 2012-03-27 LAB — BASIC METABOLIC PANEL
CO2: 27 mEq/L (ref 19–32)
Chloride: 102 mEq/L (ref 96–112)
Creatinine, Ser: 0.83 mg/dL (ref 0.50–1.35)
GFR calc Af Amer: >90 mL/min (ref >90)
Potassium: 4.1 mEq/L (ref 3.5–5.1)
Sodium: 141 mEq/L (ref 135–145)

## 2012-03-27 LAB — PROTIME-INR
INR: 1.03 (ref 0.00–1.49)
Prothrombin Time: 13.7 seconds (ref 11.6–15.2)

## 2012-03-27 LAB — URINALYSIS, ROUTINE W REFLEX MICROSCOPIC
Glucose, UA: 250 mg/dL — AB
Hgb urine dipstick: NEGATIVE
Protein, ur: NEGATIVE mg/dL

## 2012-03-27 LAB — PREPARE RBC (CROSSMATCH)

## 2012-03-27 LAB — OCCULT BLOOD, POC DEVICE: Fecal Occult Bld: POSITIVE

## 2012-03-27 SURGERY — EGD (ESOPHAGOGASTRODUODENOSCOPY)
Anesthesia: Moderate Sedation

## 2012-03-27 MED ORDER — METOPROLOL SUCCINATE ER 25 MG PO TB24
25.0000 mg | ORAL_TABLET | Freq: Two times a day (BID) | ORAL | Status: DC
  Administered 2012-03-27 – 2012-03-28 (×3): 25 mg via ORAL
  Filled 2012-03-27 (×4): qty 1

## 2012-03-27 MED ORDER — FUROSEMIDE 10 MG/ML IJ SOLN
20.0000 mg | Freq: Two times a day (BID) | INTRAMUSCULAR | Status: DC
  Administered 2012-03-27: 20 mg via INTRAVENOUS
  Filled 2012-03-27 (×2): qty 2

## 2012-03-27 MED ORDER — PANTOPRAZOLE SODIUM 40 MG IV SOLR
40.0000 mg | Freq: Two times a day (BID) | INTRAVENOUS | Status: DC
  Administered 2012-03-28: 40 mg via INTRAVENOUS
  Filled 2012-03-27 (×3): qty 40

## 2012-03-27 MED ORDER — ZOLPIDEM TARTRATE 10 MG PO TABS
10.0000 mg | ORAL_TABLET | Freq: Every evening | ORAL | Status: DC | PRN
  Administered 2012-03-27: 10 mg via ORAL
  Filled 2012-03-27: qty 1

## 2012-03-27 MED ORDER — INSULIN ASPART 100 UNIT/ML ~~LOC~~ SOLN
4.0000 [IU] | Freq: Three times a day (TID) | SUBCUTANEOUS | Status: DC
  Administered 2012-03-27 – 2012-03-28 (×4): 4 [IU] via SUBCUTANEOUS

## 2012-03-27 MED ORDER — INSULIN REGULAR HUMAN 100 UNIT/ML IJ SOLN
4.0000 [IU] | Freq: Three times a day (TID) | INTRAMUSCULAR | Status: DC
  Filled 2012-03-27 (×27): qty 0.04

## 2012-03-27 MED ORDER — FUROSEMIDE 10 MG/ML IJ SOLN
20.0000 mg | Freq: Once | INTRAMUSCULAR | Status: AC
  Administered 2012-03-27: 20 mg via INTRAVENOUS
  Filled 2012-03-27: qty 2

## 2012-03-27 MED ORDER — DOCUSATE SODIUM 100 MG PO CAPS
100.0000 mg | ORAL_CAPSULE | Freq: Two times a day (BID) | ORAL | Status: DC
  Administered 2012-03-27 – 2012-03-28 (×2): 100 mg via ORAL
  Filled 2012-03-27 (×4): qty 1

## 2012-03-27 MED ORDER — SIMVASTATIN 10 MG PO TABS
10.0000 mg | ORAL_TABLET | Freq: Every day | ORAL | Status: DC
  Administered 2012-03-27: 10 mg via ORAL
  Filled 2012-03-27 (×2): qty 1

## 2012-03-27 MED ORDER — FENTANYL CITRATE 0.05 MG/ML IJ SOLN
INTRAMUSCULAR | Status: AC
  Filled 2012-03-27: qty 2

## 2012-03-27 MED ORDER — ASPIRIN 81 MG PO TABS: 81.0000 mg | ORAL_TABLET | Freq: Every day | ORAL | Status: DC

## 2012-03-27 MED ORDER — SODIUM CHLORIDE 0.9 % IV SOLN
250.0000 mL | INTRAVENOUS | Status: DC | PRN
  Administered 2012-03-27: 250 mL via INTRAVENOUS

## 2012-03-27 MED ORDER — CITALOPRAM HYDROBROMIDE 10 MG PO TABS
10.0000 mg | ORAL_TABLET | Freq: Every day | ORAL | Status: DC
  Administered 2012-03-27 – 2012-03-28 (×2): 10 mg via ORAL
  Filled 2012-03-27 (×2): qty 1

## 2012-03-27 MED ORDER — ALPRAZOLAM 0.25 MG PO TABS
0.2500 mg | ORAL_TABLET | Freq: Three times a day (TID) | ORAL | Status: DC | PRN
  Administered 2012-03-27: 0.25 mg via ORAL
  Filled 2012-03-27: qty 1

## 2012-03-27 MED ORDER — SODIUM CHLORIDE 0.9 % IJ SOLN: 3.0000 mL | INTRAMUSCULAR | Status: DC | PRN

## 2012-03-27 MED ORDER — INSULIN ASPART 100 UNIT/ML ~~LOC~~ SOLN
0.0000 [IU] | Freq: Three times a day (TID) | SUBCUTANEOUS | Status: DC
  Administered 2012-03-27 (×2): 7 [IU] via SUBCUTANEOUS
  Administered 2012-03-27: 20 [IU] via SUBCUTANEOUS
  Administered 2012-03-28: 4 [IU] via SUBCUTANEOUS
  Administered 2012-03-28: 3 [IU] via SUBCUTANEOUS

## 2012-03-27 MED ORDER — LORAZEPAM 2 MG/ML IJ SOLN
0.5000 mg | Freq: Once | INTRAMUSCULAR | Status: AC
  Administered 2012-03-27: 0.5 mg via INTRAVENOUS

## 2012-03-27 MED ORDER — ACETAMINOPHEN 650 MG RE SUPP: 650.0000 mg | Freq: Four times a day (QID) | RECTAL | Status: DC | PRN

## 2012-03-27 MED ORDER — POLYSACCHARIDE IRON COMPLEX 150 MG PO CAPS
150.0000 mg | ORAL_CAPSULE | Freq: Two times a day (BID) | ORAL | Status: DC
  Administered 2012-03-27 – 2012-03-28 (×3): 150 mg via ORAL
  Filled 2012-03-27 (×4): qty 1

## 2012-03-27 MED ORDER — DIPHENHYDRAMINE HCL 50 MG/ML IJ SOLN
INTRAMUSCULAR | Status: AC
  Filled 2012-03-27: qty 1

## 2012-03-27 MED ORDER — FENTANYL NICU IV SYRINGE 50 MCG/ML
INJECTION | INTRAMUSCULAR | Status: DC | PRN
  Administered 2012-03-27 (×3): 12.5 ug via INTRAVENOUS

## 2012-03-27 MED ORDER — MIDAZOLAM HCL 10 MG/2ML IJ SOLN
INTRAMUSCULAR | Status: AC
  Filled 2012-03-27: qty 2

## 2012-03-27 MED ORDER — MIDAZOLAM HCL 10 MG/2ML IJ SOLN
INTRAMUSCULAR | Status: DC | PRN
  Administered 2012-03-27 (×3): 1 mg via INTRAVENOUS

## 2012-03-27 MED ORDER — BUTAMBEN-TETRACAINE-BENZOCAINE 2-2-14 % EX AERO
INHALATION_SPRAY | CUTANEOUS | Status: DC | PRN
  Administered 2012-03-27: 2 via TOPICAL

## 2012-03-27 MED ORDER — ONDANSETRON HCL 4 MG/2ML IJ SOLN: 4.0000 mg | Freq: Four times a day (QID) | INTRAMUSCULAR | Status: DC | PRN

## 2012-03-27 MED ORDER — SODIUM CHLORIDE 0.9 % IJ SOLN
3.0000 mL | Freq: Two times a day (BID) | INTRAMUSCULAR | Status: DC
  Administered 2012-03-27: 3 mL via INTRAVENOUS

## 2012-03-27 MED ORDER — ONDANSETRON HCL 4 MG PO TABS: 4.0000 mg | ORAL_TABLET | Freq: Four times a day (QID) | ORAL | Status: DC | PRN

## 2012-03-27 MED ORDER — ACETAMINOPHEN 325 MG PO TABS: 650.0000 mg | ORAL_TABLET | Freq: Four times a day (QID) | ORAL | Status: DC | PRN

## 2012-03-27 MED ORDER — SENNA 8.6 MG PO TABS
1.0000 | ORAL_TABLET | Freq: Two times a day (BID) | ORAL | Status: DC
  Administered 2012-03-27 – 2012-03-28 (×2): 8.6 mg via ORAL
  Filled 2012-03-27 (×3): qty 1

## 2012-03-27 MED ORDER — ASPIRIN 81 MG PO CHEW
81.0000 mg | CHEWABLE_TABLET | Freq: Every day | ORAL | Status: DC
  Administered 2012-03-27 – 2012-03-28 (×2): 81 mg via ORAL
  Filled 2012-03-27 (×2): qty 1

## 2012-03-27 MED ORDER — FOLIC ACID 1 MG PO TABS
1.0000 mg | ORAL_TABLET | Freq: Every day | ORAL | Status: DC
  Administered 2012-03-27 – 2012-03-28 (×2): 1 mg via ORAL
  Filled 2012-03-27 (×2): qty 1

## 2012-03-27 MED ORDER — BUDESONIDE-FORMOTEROL FUMARATE 160-4.5 MCG/ACT IN AERO
2.0000 | INHALATION_SPRAY | Freq: Two times a day (BID) | RESPIRATORY_TRACT | Status: DC
  Administered 2012-03-27 – 2012-03-28 (×3): 2 via RESPIRATORY_TRACT
  Filled 2012-03-27: qty 6

## 2012-03-27 MED ORDER — SODIUM CHLORIDE 0.9 % IV SOLN
Freq: Once | INTRAVENOUS | Status: AC
  Administered 2012-03-27: 250 mL via INTRAVENOUS

## 2012-03-27 MED ORDER — HYDROCODONE-ACETAMINOPHEN 5-325 MG PO TABS
1.0000 | ORAL_TABLET | Freq: Four times a day (QID) | ORAL | Status: DC | PRN
  Administered 2012-03-27 (×2): 1 via ORAL
  Filled 2012-03-27 (×2): qty 1

## 2012-03-27 MED ORDER — SODIUM CHLORIDE 0.9 % IV SOLN
80.0000 mg | Freq: Two times a day (BID) | INTRAVENOUS | Status: DC
  Filled 2012-03-27: qty 80

## 2012-03-27 MED ORDER — LORAZEPAM 2 MG/ML IJ SOLN
INTRAMUSCULAR | Status: AC
  Administered 2012-03-27: 0.5 mg via INTRAVENOUS
  Filled 2012-03-27: qty 1

## 2012-03-27 MED ORDER — IPRATROPIUM-ALBUTEROL 18-103 MCG/ACT IN AERO: 2.0000 | INHALATION_SPRAY | Freq: Four times a day (QID) | RESPIRATORY_TRACT | Status: DC | PRN

## 2012-03-27 MED ORDER — SODIUM CHLORIDE 0.9 % IJ SOLN
3.0000 mL | Freq: Two times a day (BID) | INTRAMUSCULAR | Status: DC
  Administered 2012-03-27 – 2012-03-28 (×3): 3 mL via INTRAVENOUS

## 2012-03-27 NOTE — Progress Notes (Signed)
Pt up in bed w/o any s/s of distress or pain. Pt is alert and oriented. MD was at beside and spoke with pt in detail. Lab called and informed to hold off on drawing blood d/t pt receiving blood at this times.

## 2012-03-27 NOTE — Progress Notes (Signed)
I have directly reviewed the clinical findings, lab, imaging studies and management of this patient in detail. I have interviewed and examined the patient and agree with the documentation,  as recorded by the Physician extender.    Patient admitted last night for shortness of breath which was likely combination of symptomatic acute on chronic anemia, acute on chronic diastolic CHF last echo in January showing a preserved EF of over 50% with grade 1 diastolic CHF.    Status post 20 mg of IV Lasix and one unit of packed RBC transfusion early this morning, already feeling close to baseline, second packed RBC transfusion to be started, will repeat Lasix 2 hours into transfusion, patient to be seen by GI for chronic anemia of unclear etiology. He was Hemoccult positive, off note anemia panel is not ordered and patient has already received one unit of blood.    Has had unremarkable colonoscopy in the past.   He does have mild leukocytosis but no fever, chest x-ray shows atelectasis UA stable. Continue to monitor.     Leroy Sea M.D on 03/27/2012 at 11:00 AM  [facility] Office  [phone]

## 2012-03-27 NOTE — Care Management Note (Signed)
    Page 1 of 1   03/28/2012     12:11:04 PM   CARE MANAGEMENT NOTE 03/28/2012  Patient:  [name], [name]   Account Number:  [id number]  Date Initiated:  03/27/2012  Documentation initiated by:  Lanier Clam  Subjective/Objective Assessment:   ADMITTED W/GIB     Action/Plan:   FROM HOME   Anticipated DC Date:  03/28/2012   Anticipated DC Plan:  HOME W HOME HEALTH SERVICES      DC Planning Services  CM consult      Choice offered to / List presented to:  C-1 Patient   DME arranged  [facility]      DME agency  [facility]     HH arranged  HH-2 PT      [facility] agency  [facility]   Status of service:  Completed, signed off Medicare Important Message given?   (If response is "NO", the following Medicare IM given date fields will be blank) Date Medicare IM given:   Date Additional Medicare IM given:    Discharge Disposition:  HOME W HOME HEALTH SERVICES  Per UR Regulation:  Reviewed for med. necessity/level of care/duration of stay  If discussed at Long Length of Stay Meetings, dates discussed:    Comments:  03/28/12 Riki Gehring RN,BSN NCM [phone] PATIENT HAS HOME 02 VIA [facility],HE NEEDS A TRAVEL BROUGHT TO HOSPITAL,[facility] AWARE OF HHPT,& HOME 02 TRAVEL.THEY WILL ALSO TALK TO PATIENT ABOUT HOW THE PROCESS,& DME IS HANDLED @ HOME.  03/27/12 Atul Delucia RN,BSN NCM [phone]

## 2012-03-27 NOTE — Progress Notes (Signed)
Attempted to call report to [facility]. RN unavailable at this time, Call back number left with Diplomatic Services operational officer

## 2012-03-27 NOTE — Interval H&P Note (Signed)
History and Physical Interval Note:  03/27/2012 3:45 PM  [name]  has presented today for surgery, with the diagnosis of Anemia and heme positive stool  The various methods of treatment have been discussed with the patient and family. After consideration of risks, benefits and other options for treatment, the patient has consented to  Procedure(s) (LRB): ESOPHAGOGASTRODUODENOSCOPY (EGD) (N/A) as a surgical intervention .  The patients' history has been reviewed, patient examined, no change in status, stable for surgery.  I have reviewed the patients' chart and labs.  Questions were answered to the patient's satisfaction.     Elian Gloster D

## 2012-03-27 NOTE — Op Note (Signed)
[facility] [address] [place], [place]  [address]  OPERATIVE PROCEDURE REPORT  PATIENT:  [name], [name]  MR#:  [id number] BIRTHDATE:  1941/08/23  GENDER:  male ENDOSCOPIST:  Jeani Hawking, MD ASSISTANT:  Kandice Robinsons and Darral Dash, RN PROCEDURE DATE:  03/27/2012 PROCEDURE:  EGD for control of bleeding ASA CLASS:  Class III INDICATIONS:  Anemia MEDICATIONS:  Fentanyl 37.5 mcg IV mcg IV, Versed 3 mg IV  DESCRIPTION OF PROCEDURE:   After the risks benefits and alternatives of the procedure were thoroughly explained, informed consent was obtained.  The Pentax Gastroscope [id number] endoscope was introduced through the mouth and advanced to the second portion of the duodenum, without limitations.  The instrument was slowly withdrawn as the mucosa was fully examined. <<PROCEDUREIMAGES>>  FINDINGS:  In the distal esophagus there was evidence of a mild esophagitis. This area was friable. In the gastric fundus multiple polyps were identified and one polyp was noted to bleeding. Several other polyps exhibited erosions. In the duodenum at the D2 region multiple nonbleeding AVMs were identified as well as a duodenal polyp. The AVMs were ablated with APC, however, at one area submucosal dissection occurred. There appeared to be a mucosal defect and to safe guard against a perforation the site was sealed with two hemoclips. Attention was then turned to the duodenal polyp, which was removed with a hot snare. Unfortunately it was not able to be retrieved. There was thought about resecting the bleeding polyp in the gastric fundus, but at this point the patient was not tolerating the procedure well. He was also distened from the argon gas. Since he had multiple erosions on the polyps it was felt that he would benefit from PPI. If bleeding recurs in the future polyp resection can be pursued. Retroflexed views revealed no abnormalities.    The scope was then withdrawn from  the patient and the procedure terminated.  COMPLICATIONS:  None  IMPRESSION:  1) Nonbleeding duodenal AVMs. 2) Mild esophagitis. 3) Gastric polyps. 4) Duodenal polyp. RECOMMENDATIONS:  1) Follow HGB. 2) Transfuse as needed. 3) PPI QD indefinitely.  ______________________________ Jeani Hawking, MD  n. Rosalie DoctorJeani Hawking at 03/27/2012 04:37 PM  Christ Kick, [phone]

## 2012-03-27 NOTE — Progress Notes (Signed)
Subjective: Ambulating in hall with PT. Denies pain/discomfort. Mild SOB. NAD. Gait steady with walker.   Objective: Vital signs Filed Vitals:   03/27/12 0600 03/27/12 0800 03/27/12 0819 03/27/12 0930  BP: 127/80 137/82  129/83  Pulse: 102 101  94  Temp: 97.6 F (36.4 C) 98.1 F (36.7 C)  98 F (36.7 C)  TempSrc: Oral Oral  Oral  Resp: 18 20  20   Height:      Weight:      SpO2: 98%  98%    Weight change:  Last BM Date: 03/27/12  Intake/Output from previous day: 06/10 0701 - 06/11 0700 In: 1440 [P.O.:240; I.V.:500; Blood:700] Out: 850 [Urine:850] Total I/O In: -  Out: 250 [Urine:250]   Physical Exam: General: Alert, awake, oriented x3, in no acute distress.somewhat irritable.  HEENT: No bruits, no goiter. Mucus membranes of mouth moist/slightly pale. PERRL Heart: Regular rate and rhythm and distant, without murmurs, rubs, gallops. Lungs: Normal effort. Breath sounds distant. No use of inspiratory muscles. Faint exp wheeze Abdomen: Obese, Soft, nontender, nondistended, positive bowel sounds. Extremities: No clubbing cyanosis with positive pedal pulses. Trace to 1+LEE Neuro: Grossly intact, nonfocal. Very talkative and easy to become irritable. Cranial nerve II-XII intact.     Lab Results: Basic Metabolic Panel:  Basename 03/27/12 0044  NA 141  K 4.1  CL 102  CO2 27  GLUCOSE 121*  BUN 13  CREATININE 0.83  CALCIUM 9.0  MG --  PHOS --   Liver Function Tests: No results found for this basename: AST:2,ALT:2,ALKPHOS:2,BILITOT:2,PROT:2,ALBUMIN:2 in the last 72 hours No results found for this basename: LIPASE:2,AMYLASE:2 in the last 72 hours No results found for this basename: AMMONIA:2 in the last 72 hours CBC:  Basename 03/27/12 0044  WBC 12.8*  NEUTROABS 9.9*  HGB 7.2*  HCT 26.3*  MCV 80.2  PLT 291   Cardiac Enzymes: No results found for this basename: CKTOTAL:3,CKMB:3,CKMBINDEX:3,TROPONINI:3 in the last 72 hours BNP: No results found for this  basename: PROBNP:3 in the last 72 hours D-Dimer: No results found for this basename: DDIMER:2 in the last 72 hours CBG:  Basename 03/27/12 0744  GLUCAP 368*   Hemoglobin A1C: No results found for this basename: HGBA1C in the last 72 hours Fasting Lipid Panel: No results found for this basename: CHOL,HDL,LDLCALC,TRIG,CHOLHDL,LDLDIRECT in the last 72 hours Thyroid Function Tests: No results found for this basename: TSH,T4TOTAL,FREET4,T3FREE,THYROIDAB in the last 72 hours Anemia Panel: No results found for this basename: VITAMINB12,FOLATE,FERRITIN,TIBC,IRON,RETICCTPCT in the last 72 hours Coagulation: No results found for this basename: LABPROT:2,INR:2 in the last 72 hours Urine Drug Screen: Drugs of Abuse  No results found for this basename: labopia, cocainscrnur, labbenz, amphetmu, thcu, labbarb    Alcohol Level: No results found for this basename: ETH:2 in the last 72 hours Urinalysis:  Basename 03/27/12 0507  COLORURINE YELLOW  LABSPEC 1.030  PHURINE 5.5  GLUCOSEU 250*  HGBUR NEGATIVE  BILIRUBINUR NEGATIVE  KETONESUR TRACE*  PROTEINUR NEGATIVE  UROBILINOGEN 0.2  NITRITE NEGATIVE  LEUKOCYTESUR NEGATIVE   Misc. Labs:  No results found for this or any previous visit (from the past 240 hour(s)).  Studies/Results: Dg Chest 2 View  03/27/2012  *RADIOLOGY REPORT*  Clinical Data: Shortness of breath  CHEST - 2 VIEW  Comparison: 01/27/2010  Findings: Due to patient body habitus, suboptimal x-ray exposure which limits evaluation for subtle abnormality.  Cardiomegaly. Central vascular congestion.  Status post median sternotomy and CABG.  Bullous changes of the left lung apex. Bronchitic changes. Bibasilar opacities.  Large hiatal hernia.  Osteopenia.  Remote posterior right rib fracture.  IMPRESSION: Cardiomegaly with central vascular congestion.  COPD/bronchitic changes.  Bibasilar opacities; atelectasis versus infiltrate.  Original Report Authenticated By: Waneta Martins,  M.D.    Medications: Scheduled Meds:   . albuterol  5 mg Nebulization Once  . aspirin  81 mg Oral Daily  . budesonide-formoterol  2 puff Inhalation BID  . citalopram  10 mg Oral Daily  . docusate sodium  100 mg Oral BID  . folic acid  1 mg Oral Daily  . furosemide  20 mg Intravenous Once  . insulin aspart  0-20 Units Subcutaneous TID WC  . ipratropium  0.5 mg Nebulization Once  . iron polysaccharides  150 mg Oral BID  . methylPREDNISolone (SOLU-MEDROL) injection  125 mg Intravenous Once  . metoprolol succinate  25 mg Oral BID  . pantoprazole (PROTONIX) IV  80 mg Intravenous Q12H  . senna  1 tablet Oral BID  . simvastatin  10 mg Oral QHS  . sodium chloride  3 mL Intravenous Q12H  . sodium chloride  3 mL Intravenous Q12H  . DISCONTD: aspirin  81 mg Oral Daily  . DISCONTD: furosemide  20 mg Intravenous Q12H   Continuous Infusions:  PRN Meds:.sodium chloride, acetaminophen, acetaminophen, albuterol-ipratropium, ALPRAZolam, HYDROcodone-acetaminophen, ondansetron (ZOFRAN) IV, ondansetron, sodium chloride, zolpidem  Assessment/Plan:  Principal Problem:  *GI bleeding Active Problems:  COPD (chronic obstructive pulmonary disease)  Anemia  Dyspnea  Diastolic HF (heart failure)  Leukocytosis 1. Anemia, presumed GIB: Not sure the source of this, but seems to be recurrent problem. BUN is normal. Pt without abd pain or other risk factors to suggest PUD. 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  2 units PRBC in process. Will monitor closely. No s/sx active bleeding. Continue IV protonix, NPO, and GI consulted. Have requested report of capsule endoscopy done by Dr. Rhetta Mura in [place] .   2. Dyspnea: Likely due to anemia on background of severe COPD, cardiomegaly; of note he has some vascular congestion on CXR and pitting edema about his ankles, so is most likely mutifactorial but tipped over the edge with anemia . Given Lasix  IV with blood transfusion, 20 mg IV q12 x2 doses . Will monitor I&O's and daily wt. Volume status +353cc.   3. Leukocytosis: He is not acting infected, no fevers, no localizing source. Considered  stress reaction to the above? CXR does have atelectasis vs infiltrates but he doesn't  endorse PNA symtpoms, no cough or fevers, and frankly his habitus suggests atelectasis.  - UA  Negative. Monitor closely.  holding on ABx for now   4. Severe COPD:  Currently at baseline. Continue home combivent, symbicort. Do not feel necessary to treat for  exacerbation.   5. DM: Holding metformin, will give SSI . Will check A1c.   6. H/o diastolic HF, CAD: Continue home ASA 81, toprol. No current chest pain, ECG and Trop negative for acute ischemia. Holding subQ heparin      LOS: 1 day   [facility] M 03/27/2012, 9:51 AM
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
## 2012-03-27 NOTE — Progress Notes (Signed)
md bonno ordered for pt to have iv lasix. Pt receiving blood products at present. Called to ask if pt needs lasix now or later. MD stated give iv lasix now.

## 2012-03-27 NOTE — Progress Notes (Signed)
Patient has been agitated this morning states that no body is explaining anything to him. I have informed him that the GI doctor would be coming to see him and brought him handouts explaining GI bleeding and about the EGD. Patient  stated that "I know what the procedure is and don't need a god damn paper telling me." I attempted to tell the patient I was just trying to give him information and ensure that he understands everything. Patient continued cursing and saying that no one is telling him anything. Will continue to monitor.
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
## 2012-03-27 NOTE — ED Notes (Signed)
Hemoccult is positive. Bernette Mayers, MD aware

## 2012-03-27 NOTE — Evaluation (Addendum)
Physical Therapy Evaluation Patient Details Name: [name] MRN: [id number] DOB: 1940-12-10 Today's Date: 03/27/2012 Time: 0825-0900 PT Time Calculation (min): 25 min  PT Assessment / Plan / Recommendation Clinical Impression  Pt presents with a GI bleed and anemia.  Pt states that he has received first unit of blood and is to get one more unit today.  Pt also states frustration with mention of SNF for rehab, however pt will not require SNF.  Tolerated ambulation in hallway very well with RW, however did note some instability and education on use of RW.  Pt will benefit from skilled PT in acute venue to in order to address deficits.  PT recommends HHPT for follow up therapy at D/C in order to return pt to prior level of functioning.      PT Assessment  Patient needs continued PT services    Follow Up Recommendations  Home health PT    Barriers to Discharge None      lEquipment Recommendations  None recommended by PT    Recommendations for Other Services OT consult   Frequency Min 3X/week    Precautions / Restrictions     Pertinent Vitals/Pain 5/10      Mobility  Bed Mobility Bed Mobility: Supine to Sit Supine to Sit: 3: Mod assist;4: Min assist;HOB elevated Details for Bed Mobility Assistance: Requires some assist for trunk to attain sitting position.  Cues for hand placement and technique.  Transfers Transfers: Sit to Stand;Stand to Sit Sit to Stand: 4: Min assist;With upper extremity assist;From bed;From elevated surface Stand to Sit: 4: Min guard;With upper extremity assist;With armrests;To chair/3-in-1 Details for Transfer Assistance: Assist to stabalize in standing due to pt stating he felt weak with cues for hand placement and safety when sitting/standing.  Ambulation/Gait Ambulation/Gait Assistance: 4: Min guard Ambulation Distance (Feet): 180 Feet Assistive device: Rolling walker Ambulation/Gait Assistance Details: cues for maintaining position inside RW,  esp with turns.  Pt ambulated on 3 LO2 via nasal cannula with sats at 95% following 100'.  continued to ambulate on 3L and returned pt to 3L in room.  Gait Pattern: Step-through pattern;Decreased stride length Gait velocity: decreased Stairs: No Wheelchair Mobility Wheelchair Mobility: No    Exercises     PT Diagnosis: Abnormality of gait;Generalized weakness;Acute pain  PT Problem List: Decreased strength;Decreased activity tolerance;Decreased balance;Decreased mobility PT Treatment Interventions: DME instruction;Gait training;Stair training;Functional mobility training;Therapeutic activities;Therapeutic exercise;Balance training;Patient/family education   PT Goals Acute Rehab PT Goals PT Goal Formulation: With patient Time For Goal Achievement: 04/10/12 Potential to Achieve Goals: Good Pt will go Sit to Stand: with supervision PT Goal: Sit to Stand - Progress: Goal set today Pt will go Stand to Sit: with supervision PT Goal: Stand to Sit - Progress: Goal set today Pt will Ambulate: >150 feet;with modified independence;with least restrictive assistive device PT Goal: Ambulate - Progress: Goal set today Pt will Go Up / Down Stairs: 3-5 stairs;with supervision;with least restrictive assistive device;with rail(s) PT Goal: Up/Down Stairs - Progress: Goal set today Pt will Perform Home Exercise Program: with supervision, verbal cues required/provided PT Goal: Perform Home Exercise Program - Progress: Goal set today  Visit Information  Last PT Received On: 03/27/12 Assistance Needed: +1    Subjective Data  Subjective: I am NOT going to a nursing home.  Patient Stated Goal: to get back home   Prior Functioning  Home Living Lives With: Spouse Available Help at Discharge: Family Type of Home: House Home Access: Stairs to enter [employer]  of Steps: 4 Entrance Stairs-Rails: Right;Left Home Layout: One level Bathroom Shower/Tub: Engineer, manufacturing systems:  Standard Home Adaptive Equipment: Walker - rolling;Straight cane;Bedside commode/3-in-1 Prior Function Level of Independence: Independent with assistive device(s) Able to Take Stairs?: Yes Communication Communication: No difficulties    Cognition  Overall Cognitive Status: Appears within functional limits for tasks assessed/performed Arousal/Alertness: Awake/alert Orientation Level: Appears intact for tasks assessed Behavior During Session: Agitated    Extremity/Trunk Assessment Right Lower Extremity Assessment RLE ROM/Strength/Tone: WFL for tasks assessed RLE Sensation: WFL - Light Touch RLE Coordination: WFL - gross motor Left Lower Extremity Assessment LLE ROM/Strength/Tone: WFL for tasks assessed LLE Sensation: WFL - Light Touch LLE Coordination: WFL - gross motor Trunk Assessment Trunk Assessment: Normal   Balance Balance Balance Assessed: Yes Dynamic Standing Balance Dynamic Standing - Balance Support: Bilateral upper extremity supported Dynamic Standing - Level of Assistance: 4: Min assist;5: Stand by assistance Dynamic Standing - Comments: During ambulation, pt noted to be somewhat unsteady, therefore required min/guard assist.   End of Session PT - End of Session Activity Tolerance: Patient tolerated treatment well;Treatment limited secondary to agitation Patient left: in chair;with call bell/phone within reach Nurse Communication: Mobility status   Page, Meribeth Mattes 03/27/2012, 9:21 AM

## 2012-03-28 ENCOUNTER — Encounter (HOSPITAL_COMMUNITY): Payer: Self-pay | Admitting: Gastroenterology

## 2012-03-28 DIAGNOSIS — D7289 Other specified disorders of white blood cells: Secondary | ICD-10-CM

## 2012-03-28 DIAGNOSIS — K922 Gastrointestinal hemorrhage, unspecified: Secondary | ICD-10-CM

## 2012-03-28 DIAGNOSIS — I5030 Unspecified diastolic (congestive) heart failure: Secondary | ICD-10-CM

## 2012-03-28 DIAGNOSIS — J438 Other emphysema: Secondary | ICD-10-CM

## 2012-03-28 LAB — GLUCOSE, CAPILLARY
Comment 3: 120005122
Glucose-Capillary: 178 mg/dL — ABNORMAL HIGH (ref 70–99)
Glucose-Capillary: 141 mg/dL — ABNORMAL HIGH (ref 70–99)

## 2012-03-28 LAB — CBC
HCT: 31.8 % — ABNORMAL LOW (ref 39.0–52.0)
Hemoglobin: 9.2 g/dL — ABNORMAL LOW (ref 13.0–17.0)
RBC: 3.91 MIL/uL — ABNORMAL LOW (ref 4.22–5.81)
WBC: 13.9 10*3/uL — ABNORMAL HIGH (ref 4.0–10.5)

## 2012-03-28 LAB — BASIC METABOLIC PANEL
BUN: 17 mg/dL (ref 6–23)
CO2: 29 mEq/L (ref 19–32)
Chloride: 97 mEq/L (ref 96–112)
Glucose, Bld: 173 mg/dL — ABNORMAL HIGH (ref 70–99)
Potassium: 4.2 mEq/L (ref 3.5–5.1)
Sodium: 136 mEq/L (ref 135–145)

## 2012-03-28 LAB — TYPE AND SCREEN
ABO/RH(D): B NEG
Unit division: 0

## 2012-03-28 LAB — HEMOGLOBIN A1C
Hgb A1c MFr Bld: 6.7 % — ABNORMAL HIGH
Mean Plasma Glucose: 146 mg/dL — ABNORMAL HIGH

## 2012-03-28 MED ORDER — ALBUTEROL SULFATE HFA 108 (90 BASE) MCG/ACT IN AERS: 2.0000 | INHALATION_SPRAY | Freq: Four times a day (QID) | RESPIRATORY_TRACT | Status: DC | PRN

## 2012-03-28 MED ORDER — IPRATROPIUM-ALBUTEROL 20-100 MCG/ACT IN AERS
1.0000 | INHALATION_SPRAY | Freq: Four times a day (QID) | RESPIRATORY_TRACT | Status: DC | PRN
  Administered 2012-03-28: 1 via RESPIRATORY_TRACT
  Filled 2012-03-28: qty 4

## 2012-03-28 MED ORDER — PANTOPRAZOLE SODIUM 40 MG PO TBEC: 40.0000 mg | DELAYED_RELEASE_TABLET | Freq: Two times a day (BID) | ORAL | Status: DC

## 2012-03-28 MED ORDER — IPRATROPIUM-ALBUTEROL 20-100 MCG/ACT IN AERS: 1.0000 | INHALATION_SPRAY | Freq: Four times a day (QID) | RESPIRATORY_TRACT | Status: DC | PRN

## 2012-03-28 MED ORDER — ROSUVASTATIN CALCIUM 40 MG PO TABS: 40.0000 mg | ORAL_TABLET | Freq: Every day | ORAL | Status: DC

## 2012-03-28 NOTE — Progress Notes (Signed)
Pt IV out. No bleeding noted.Cath intact. 2nd saline lock in right hand size 24. Flushes well.

## 2012-03-28 NOTE — Discharge Summary (Signed)
Physician Discharge Summary  Patient ID: [name] MRN: [id number] DOB/AGE: 03/09/1941 71 y.o.  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  Principal Problem:  *GI bleeding Active Problems:  COPD (chronic obstructive pulmonary disease)  Anemia  Dyspnea  Diastolic HF (heart failure)  Leukocytosis   Medication List  As of 03/28/2012 12:39 PM   TAKE these medications         albuterol 108 (90 BASE) MCG/ACT inhaler   Commonly known as: PROVENTIL HFA;VENTOLIN HFA   Inhale 2 puffs into the lungs every 6 (six) hours as needed. For sob      albuterol-ipratropium 18-103 MCG/ACT inhaler   Commonly known as: COMBIVENT   Inhale 2 puffs into the lungs every 6 (six) hours as needed for wheezing.      ALPRAZolam 0.25 MG tablet   Commonly known as: XANAX   Take 1 tablet (0.25 mg total) by mouth 3 (three) times daily as needed for anxiety.      aspirin 81 MG tablet   Take 81 mg by mouth daily.      budesonide-formoterol 160-4.5 MCG/ACT inhaler   Commonly known as: SYMBICORT   Inhale 2 puffs into the lungs 2 (two) times daily.      citalopram 10 MG tablet   Commonly known as: CELEXA   Take 1 tablet (10 mg total) by mouth daily.      folic acid 1 MG tablet   Commonly known as: FOLVITE   Take 1 mg by mouth daily.      HYDROcodone-acetaminophen 5-500 MG per tablet   Commonly known as: VICODIN   Take 1 tablet by mouth every 4 (four) hours as needed.      metFORMIN 500 MG tablet   Commonly known as: GLUCOPHAGE   Take 1 tablet (500 mg total) by mouth 2 (two) times daily with a meal.      metoprolol succinate 25 MG 24 hr tablet   Commonly known as: TOPROL-XL   Take 25 mg by mouth 2 (two) times daily.      omeprazole 40 MG capsule   Commonly known as: PRILOSEC   Take 40 mg by mouth 2 (two) times daily.      polysaccharide iron 150  MG capsule   Generic drug: iron polysaccharides   Take 150 mg by mouth 2 (two) times daily.      simvastatin 10 MG tablet   Commonly known as: ZOCOR   Take 10 mg by mouth at bedtime.      zolpidem 10 MG tablet   Commonly known as: AMBIEN   Take 1 tablet (10 mg total) by mouth at bedtime as needed.             Disposition and Follow-up:  Follow-up Information    Follow up with [name], MD. Schedule an appointment as soon as possible for a visit in 1 week. (Will need CBC to monitor Hg. Follow up with  CBG readings. )    Contact information:   [address], [place] [address] [phone]          Consults:  GI   Physical Exam: General: Alert, awake, oriented x3, in no acute distress.somewhat irritable.  HEENT: No bruits, no goiter. Mucus membranes of mouth moist/slightly pale. PERRL  Heart: Regular rate and rhythm and distant,  without murmurs, rubs, gallops.  Lungs: Normal effort. Breath sounds distant. No use of inspiratory muscles. Faint exp wheeze  Abdomen: Obese, Soft, nontender, nondistended, positive bowel sounds.  Extremities: No clubbing cyanosis with positive pedal pulses. Trace to 1+LEE  Neuro: Grossly intact, nonfocal. Very talkative and easy to become irritable. Cranial nerve II-XII intact.   Significant Diagnostic Studies:    Results for orders placed during the hospital encounter of 03/26/12 (from the past 48 hour(s))  CBC     Status: Abnormal   Collection Time   03/27/12 12:44 AM      Component Value Range Comment   WBC 12.8 (*) 4.0 - 10.5 K/uL    RBC 3.28 (*) 4.22 - 5.81 MIL/uL    Hemoglobin 7.2 (*) 13.0 - 17.0 g/dL    HCT 16.1 (*) 09.6 - 52.0 %    MCV 80.2  78.0 - 100.0 fL    MCH 22.0 (*) 26.0 - 34.0 pg    MCHC 27.4 (*) 30.0 - 36.0 g/dL    RDW 04.5 (*) 40.9 - 15.5 %    Platelets 291  150 - 400 K/uL   DIFFERENTIAL     Status: Abnormal   Collection Time   03/27/12 12:44 AM      Component Value  Range Comment   Neutrophils Relative 77  43 - 77 %    Lymphocytes Relative 14  12 - 46 %    Monocytes Relative 8  3 - 12 %    Eosinophils Relative 1  0 - 5 %    Basophils Relative 0  0 - 1 %    Neutro Abs 9.9 (*) 1.7 - 7.7 K/uL    Lymphs Abs 1.8  0.7 - 4.0 K/uL    Monocytes Absolute 1.0  0.1 - 1.0 K/uL    Eosinophils Absolute 0.1  0.0 - 0.7 K/uL    Basophils Absolute 0.0  0.0 - 0.1 K/uL    RBC Morphology MARKED POLYCHROMASIA      WBC Morphology TOXIC GRANULATION      Smear Review LARGE PLATELETS PRESENT     BASIC METABOLIC PANEL     Status: Abnormal   Collection Time   03/27/12 12:44 AM      Component Value Range Comment   Sodium 141  135 - 145 mEq/L    Potassium 4.1  3.5 - 5.1 mEq/L    Chloride 102  96 - 112 mEq/L    CO2 27  19 - 32 mEq/L    Glucose, Bld 121 (*) 70 - 99 mg/dL    BUN 13  6 - 23 mg/dL    Creatinine, Ser 8.11  0.50 - 1.35 mg/dL    Calcium 9.0  8.4 - 91.4 mg/dL    GFR calc non Af Amer 87 (*) >90 mL/min    GFR calc Af Amer >90  >90 mL/min   POCT I-STAT TROPONIN I     Status: Normal   Collection Time   03/27/12 12:45 AM      Component Value Range Comment   Troponin i, poc 0.00  0.00 - 0.08 ng/mL    Comment 3            OCCULT BLOOD, POC DEVICE     Status: Normal   Collection Time   03/27/12  2:06 AM      Component Value Range Comment   Fecal Occult Bld POSITIVE     PREPARE RBC (CROSSMATCH)     Status: Normal   Collection  Time   03/27/12  2:30 AM      Component Value Range Comment   Order Confirmation ORDER PROCESSED BY BLOOD BANK     TYPE AND SCREEN     Status: Normal   Collection Time   03/27/12  3:25 AM      Component Value Range Comment   ABO/RH(D) B NEG      Antibody Screen NEG      Sample Expiration 03/30/2012      Unit Number [id number]      Blood Component Type RED CELLS,LR      Unit division 00      Status of Unit ISSUED,FINAL      Transfusion Status OK TO TRANSFUSE      Crossmatch Result Compatible      Unit Number [id number]      Blood  Component Type RED CELLS,LR      Unit division 00      Status of Unit ISSUED,FINAL      Transfusion Status OK TO TRANSFUSE      Crossmatch Result Compatible     OCCULT BLOOD X 1 CARD TO LAB, STOOL     Status: Normal   Collection Time   03/27/12  5:07 AM      Component Value Range Comment   Fecal Occult Bld POSITIVE     URINALYSIS, ROUTINE W REFLEX MICROSCOPIC     Status: Abnormal   Collection Time   03/27/12  5:07 AM      Component Value Range Comment   Color, Urine YELLOW  YELLOW    APPearance CLEAR  CLEAR    Specific Gravity, Urine 1.030  1.005 - 1.030    pH 5.5  5.0 - 8.0    Glucose, UA 250 (*) NEGATIVE mg/dL    Hgb urine dipstick NEGATIVE  NEGATIVE    Bilirubin Urine NEGATIVE  NEGATIVE    Ketones, ur TRACE (*) NEGATIVE mg/dL    Protein, ur NEGATIVE  NEGATIVE mg/dL    Urobilinogen, UA 0.2  0.0 - 1.0 mg/dL    Nitrite NEGATIVE  NEGATIVE    Leukocytes, UA NEGATIVE  NEGATIVE MICROSCOPIC NOT DONE ON URINES WITH NEGATIVE PROTEIN, BLOOD, LEUKOCYTES, NITRITE, OR GLUCOSE <1000 mg/dL.  GLUCOSE, CAPILLARY     Status: Abnormal   Collection Time   03/27/12  7:44 AM      Component Value Range Comment   Glucose-Capillary 368 (*) 70 - 99 mg/dL    Comment 1 Documented in Chart      Comment 2 Notify RN     GLUCOSE, CAPILLARY     Status: Abnormal   Collection Time   03/27/12 11:54 AM      Component Value Range Comment   Glucose-Capillary 245 (*) 70 - 99 mg/dL    Comment 1 Notify RN      Comment 2 Documented in Chart     GLUCOSE, CAPILLARY     Status: Abnormal   Collection Time   03/27/12  5:23 PM      Component Value Range Comment   Glucose-Capillary 204 (*) 70 - 99 mg/dL    Comment 1 Notify RN      Comment 2 Documented in Chart      Comment 3 [id number] Orig PT ID     CBC     Status: Abnormal   Collection Time   03/27/12  5:35 PM      Component Value Range Comment   WBC 11.0 (*) 4.0 - 10.5 K/uL  RBC 3.69 (*) 4.22 - 5.81 MIL/uL    Hemoglobin 8.9 (*) 13.0 - 17.0 g/dL    HCT 29.5 (*)  62.1 - 52.0 %    MCV 80.5  78.0 - 100.0 fL    MCH 24.1 (*) 26.0 - 34.0 pg    MCHC 30.0  30.0 - 36.0 g/dL    RDW 30.8 (*) 65.7 - 15.5 %    Platelets 274  150 - 400 K/uL   PROTIME-INR     Status: Normal   Collection Time   03/27/12  5:35 PM      Component Value Range Comment   Prothrombin Time 13.7  11.6 - 15.2 seconds    INR 1.03  0.00 - 1.49   HEMOGLOBIN A1C     Status: Abnormal   Collection Time   03/27/12  5:35 PM      Component Value Range Comment   Hemoglobin A1C 6.7 (*) <5.7 %    Mean Plasma Glucose 146 (*) <117 mg/dL   GLUCOSE, CAPILLARY     Status: Abnormal   Collection Time   03/27/12  9:45 PM      Component Value Range Comment   Glucose-Capillary 228 (*) 70 - 99 mg/dL    Comment 1 Documented in Chart      Comment 2 Notify RN     CBC     Status: Abnormal   Collection Time   03/28/12  5:43 AM      Component Value Range Comment   WBC 13.9 (*) 4.0 - 10.5 K/uL    RBC 3.91 (*) 4.22 - 5.81 MIL/uL    Hemoglobin 9.2 (*) 13.0 - 17.0 g/dL    HCT 84.6 (*) 96.2 - 52.0 %    MCV 81.3  78.0 - 100.0 fL    MCH 23.5 (*) 26.0 - 34.0 pg    MCHC 28.9 (*) 30.0 - 36.0 g/dL    RDW 95.2 (*) 84.1 - 15.5 %    Platelets 321  150 - 400 K/uL   BASIC METABOLIC PANEL     Status: Abnormal   Collection Time   03/28/12  5:43 AM      Component Value Range Comment   Sodium 136  135 - 145 mEq/L    Potassium 4.2  3.5 - 5.1 mEq/L    Chloride 97  96 - 112 mEq/L    CO2 29  19 - 32 mEq/L    Glucose, Bld 173 (*) 70 - 99 mg/dL    BUN 17  6 - 23 mg/dL    Creatinine, Ser 3.24  0.50 - 1.35 mg/dL    Calcium 9.2  8.4 - 40.1 mg/dL    GFR calc non Af Amer 87 (*) >90 mL/min    GFR calc Af Amer >90  >90 mL/min   GLUCOSE, CAPILLARY     Status: Abnormal   Collection Time   03/28/12  7:36 AM      Component Value Range Comment   Glucose-Capillary 178 (*) 70 - 99 mg/dL   GLUCOSE, CAPILLARY     Status: Abnormal   Collection Time   03/28/12 11:50 AM      Component Value Range Comment   Glucose-Capillary 141 (*) 70  - 99 mg/dL      Procedure(s): ESOPHAGOGASTRODUODENOSCOPY (EGD)   Dg Chest 2 View 03/27/2012  IMPRESSION:  Cardiomegaly with central vascular congestion.  COPD/bronchitic changes.  Bibasilar opacities; atelectasis versus infiltrate.       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 Pt  moderately reliable historian, stated that 3 yrs ago he was evaluated for acute  anemia with Dr. Dulce Sellar with EGD, colonoscopy. He returned stating that his dyspnea  worsened over the past few weeks, to the point that on 03/27/12 he was lightheaded, very dyspneic, and almost passed out a couple times just from walking down the hall to ask his wife to make him food, but did not have LOC.  Hospital Course:   Principal Problem:  *GI bleeding Active Problems:  COPD (chronic obstructive pulmonary disease)  Anemia  Dyspnea  Diastolic HF (heart failure)  Leukocytosis  1. Acute blood loss anemia, presumed to be secondary to duodenal AVM:  Admitted to tele.  Transfused 2 units PRBC's, made NPO and given IV PPI. Hg on admission 7.2. At discharge s/p 2units Hg 9.2. BUN remained normal. Seen by GI. EGD on 03/27/12 yields nonbleeding duodenal AVMs, 2) Fundic polyps with one bleeding polyp and other polyps with apical erosions and 3) S/p duodenal polyp resection. Tolerated well. Diet advanced on 03/28/12 and tolerated well. Will be discharged on long term PPI. Will need OP follow up with PCP and CBC in 1 week. Continue iron.   2. Acute on chronic respiratory failure secondary to progressive and stage IV COPD  and diastolic CHF:  Likely due to anemia on background of severe COPD, cardiomegaly; of note he has some vascular congestion on CXR and pitting edema about his ankles, so is most likely mutifactorial but tipped over the edge with anemia . Given Lasix IV with blood transfusion, 20 mg IV q12 x2 doses .  Volume status +353cc .  3. Leukocytosis:  Pt remained afebrile, no localizing source. Possibly stress reaction to the above? CXR does have atelectasis vs infiltrates but no  PNA symtpoms, no cough or fevers, and frankly his habitus suggests atelectasis.  UA Negative.    4. Severe COPD:  Remained  at baseline during hospitalization. Will continue home combivent, symbicort. Continue preadmission home oxygen. Home PT.   5. DM:  Metformin initially held. A1c 6.7. Recommend close OP follow up with PCP for optimal glycemic control.      6. H/o diastolic HF, CAD:   No issues during this hospitalization. Continue home ASA 81, toprol. No chest pain, ECG and Trop negative for acute ischemia.  Admission weight 126.4. Discharge weight 123.3.   Time spent on Discharge: 40 minutes  Signed: Gwenyth Bender 03/28/2012, 12:39 PM  MAGICK-Evaristo Tsuda [facility], pager #: [phone] Main office number: [phone]

## 2012-03-28 NOTE — Discharge Instructions (Addendum)
Gastrointestinal Bleeding  Bleeding in the gastrointestinal tract comes out when you throw up (vomit) or poop. Treatment will depend on how fast the blood is flowing, where it is coming from, and the cause. A small amount of bleeding that stops on its own may not need treatment.   HOME CARE   Do not drink alcohol.   Do not eat things that upset your stomach or give you heartburn.   Rest and limit your activity.   Do not smoke. Smoking may make your problems worse.   Wash your hands or use sanitizer every time you use the bathroom. Some bleeding is caused by germs.   Only take medicine as told by your doctor.  GET HELP RIGHT AWAY IF:    Your throw up looks like coffee grounds or is dark or bright red.   Your poop is Justene Jensen or tarry. You see blood in the toilet.   You feel weak, dizzy, and short of breath.   You breathe fast and have a fast heartbeat.   You have bad stomach pain or cramping.  MAKE SURE YOU:    Understand these instructions.   Will watch your condition.   Will get help right away if you are not doing well or get worse.  Document Released: 07/12/2008 Document Revised: 09/22/2011 Document Reviewed: 09/12/2011  [employer] Patient Information 2012 [employer].

## 2012-03-28 NOTE — Progress Notes (Signed)
Pt in bed with eyes closed resting quietly.No change in initial shift asses. Cont with Plan of care. 

## 2012-03-28 NOTE — Progress Notes (Signed)
Subjective: No acute events.  The patient feels well.  Objective: Vital signs in last 24 hours: Temp:  [97.5 F (36.4 C)-98.8 F (37.1 C)] 97.8 F (36.6 C) (06/12 0532) Pulse Rate:  [77-99] 77  (06/12 0532) Resp:  [16-25] 18  (06/12 0532) BP: (121-158)/(60-108) 121/79 mmHg (06/12 0532) SpO2:  [92 %-100 %] 96 % (06/12 0905) Weight:  [123.3 kg (271 lb 13.2 oz)] 123.3 kg (271 lb 13.2 oz) (06/12 0500) Last BM Date: 03/25/12  Intake/Output from previous day: 06/11 0701 - 06/12 0700 In: 1422.5 [P.O.:1035; Blood:387.5] Out: 1850 [Urine:1850] Intake/Output this shift: Total I/O In: 240 [P.O.:240] Out: 175 [Urine:175]  General appearance: alert and no distress GI: obese, NTND  Lab Results:  Basename 03/28/12 0543 03/27/12 1735 03/27/12 0044  WBC 13.9* 11.0* 12.8*  HGB 9.2* 8.9* 7.2*  HCT 31.8* 29.7* 26.3*  PLT 321 274 291   BMET  Basename 03/28/12 0543 03/27/12 0044  NA 136 141  K 4.2 4.1  CL 97 102  CO2 29 27  GLUCOSE 173* 121*  BUN 17 13  CREATININE 0.83 0.83  CALCIUM 9.2 9.0   LFT No results found for this basename: PROT,ALBUMIN,AST,ALT,ALKPHOS,BILITOT,BILIDIR,IBILI in the last 72 hours PT/INR  Basename 03/27/12 1735  LABPROT 13.7  INR 1.03   Hepatitis Panel No results found for this basename: HEPBSAG,HCVAB,HEPAIGM,HEPBIGM in the last 72 hours C-Diff No results found for this basename: CDIFFTOX:3 in the last 72 hours Fecal Lactopherrin No results found for this basename: FECLLACTOFRN in the last 72 hours  Studies/Results: Dg Chest 2 View  03/27/2012  *RADIOLOGY REPORT*  Clinical Data: Shortness of breath  CHEST - 2 VIEW  Comparison: 01/27/2010  Findings: Due to patient body habitus, suboptimal x-ray exposure which limits evaluation for subtle abnormality.  Cardiomegaly. Central vascular congestion.  Status post median sternotomy and CABG.  Bullous changes of the left lung apex. Bronchitic changes. Bibasilar opacities.  Large hiatal hernia.  Osteopenia.   Remote posterior right rib fracture.  IMPRESSION: Cardiomegaly with central vascular congestion.  COPD/bronchitic changes.  Bibasilar opacities; atelectasis versus infiltrate.  Original Report Authenticated By: Waneta Martins, M.D.    Medications:  Scheduled:   . sodium chloride   Intravenous Once  . aspirin  81 mg Oral Daily  . budesonide-formoterol  2 puff Inhalation BID  . citalopram  10 mg Oral Daily  . docusate sodium  100 mg Oral BID  . folic acid  1 mg Oral Daily  . furosemide  20 mg Intravenous Once  . insulin aspart  0-20 Units Subcutaneous TID WC  . insulin aspart  4 Units Subcutaneous TID WC  . iron polysaccharides  150 mg Oral BID  . LORazepam  0.5 mg Intravenous Once  . metoprolol succinate  25 mg Oral BID  . pantoprazole (PROTONIX) IV  40 mg Intravenous Q12H  . senna  1 tablet Oral BID  . simvastatin  10 mg Oral QHS  . sodium chloride  3 mL Intravenous Q12H  . sodium chloride  3 mL Intravenous Q12H  . DISCONTD: insulin regular  4 Units Subcutaneous TID PC  . DISCONTD: pantoprazole (PROTONIX) IV  80 mg Intravenous Q12H   Continuous:   Assessment/Plan: 1) Nonbleeding duodenal AVMs. 2) Fundic polyps with one bleeding polyp and other polyps with apical erosions. 3) S/p duodenal polyp resection.   The patient is well at this time.  In fact, he is very happy with the results of the procedure.  His HGB is stable and I advanced his  diet.  I wanted to resect the fundic polyps, but he was not able to tolerate the procedure much long.  I am hoping that with a daily PPI this will help to prevent or markedly delay the recurrence of his anemia.    Plan: 1) Okay to D/C home from the GI standpoint. 2) He can follow up with his PCP and have routine blood work.   3) I did tell the patient that AVMs can recur. 4) Signing off.  LOS: 2 days   [name] D 03/28/2012, 10:13 AM

## 2012-04-02 ENCOUNTER — Telehealth: Payer: Self-pay | Admitting: Family Medicine

## 2012-04-02 NOTE — Telephone Encounter (Signed)
Caller: [place]/Spouse; PCP: [name] (Nestor Ramp); CB#: [phone]; ; ; Call regarding ?Marland Kitchen GI Bleeding; states treated in hospital 03/31/12 and had endoscopy and cautery of bleeding polyps.  States has felt "lousy" and fatigued since then, and is concerned about further bleeding.  Eating well.  States he does not look as pale as he did when he was hospitalized.  Per protocol, emergent symptoms denied; advised appt within 24 hours.  Appt sched 04/03/12 1030 with Dr. Para March.

## 2012-04-03 ENCOUNTER — Ambulatory Visit: Payer: Medicare Other | Admitting: Family Medicine

## 2012-04-05 ENCOUNTER — Other Ambulatory Visit: Payer: Self-pay | Admitting: *Deleted

## 2012-04-05 ENCOUNTER — Other Ambulatory Visit: Payer: Self-pay

## 2012-04-05 MED ORDER — HYDROCODONE-ACETAMINOPHEN 5-500 MG PO TABS
1.0000 | ORAL_TABLET | ORAL | Status: DC | PRN
Start: 2012-04-05 — End: 2012-04-06

## 2012-04-05 MED ORDER — ROSUVASTATIN CALCIUM 40 MG PO TABS: 40.0000 mg | ORAL_TABLET | Freq: Every day | ORAL | Status: DC

## 2012-04-05 NOTE — Telephone Encounter (Signed)
Advised patient and his wife.

## 2012-04-05 NOTE — Telephone Encounter (Signed)
Medicine called to pharmacy. 

## 2012-04-05 NOTE — Telephone Encounter (Signed)
Phoned request from patient.  He says he is out of vicodin, wants refill called to [facility].

## 2012-04-05 NOTE — Telephone Encounter (Signed)
Pts wife request 90 day rx Crestor 40 mg sent to [facility]. When pt recently at [facility] 10 mg changed to Crestor 40 mg. Pts wife wants to know if OK to increase med and if so please refill.

## 2012-04-06 ENCOUNTER — Inpatient Hospital Stay (HOSPITAL_COMMUNITY)
Admission: EM | Admit: 2012-04-06 | Discharge: 2012-04-14 | DRG: 378 | Disposition: A | Discharge status: Home / Self Care | Payer: Medicare Other | Attending: Family Medicine | Admitting: Family Medicine

## 2012-04-06 ENCOUNTER — Encounter (HOSPITAL_COMMUNITY): Payer: Self-pay | Admitting: *Deleted

## 2012-04-06 ENCOUNTER — Encounter: Payer: Self-pay | Admitting: Family Medicine

## 2012-04-06 ENCOUNTER — Telehealth: Payer: Self-pay | Admitting: Radiology

## 2012-04-06 ENCOUNTER — Ambulatory Visit (INDEPENDENT_AMBULATORY_CARE_PROVIDER_SITE_OTHER): Payer: Medicare Other | Admitting: Family Medicine

## 2012-04-06 VITALS — BP 122/70 | HR 102 | Temp 97.6°F

## 2012-04-06 DIAGNOSIS — D638 Anemia in other chronic diseases classified elsewhere: Secondary | ICD-10-CM | POA: Diagnosis present

## 2012-04-06 DIAGNOSIS — D72829 Elevated white blood cell count, unspecified: Secondary | ICD-10-CM

## 2012-04-06 DIAGNOSIS — E785 Hyperlipidemia, unspecified: Secondary | ICD-10-CM

## 2012-04-06 DIAGNOSIS — I5032 Chronic diastolic (congestive) heart failure: Secondary | ICD-10-CM | POA: Diagnosis present

## 2012-04-06 DIAGNOSIS — I219 Acute myocardial infarction, unspecified: Secondary | ICD-10-CM

## 2012-04-06 DIAGNOSIS — J438 Other emphysema: Secondary | ICD-10-CM

## 2012-04-06 DIAGNOSIS — I10 Essential (primary) hypertension: Secondary | ICD-10-CM

## 2012-04-06 DIAGNOSIS — J449 Chronic obstructive pulmonary disease, unspecified: Secondary | ICD-10-CM

## 2012-04-06 DIAGNOSIS — J309 Allergic rhinitis, unspecified: Secondary | ICD-10-CM

## 2012-04-06 DIAGNOSIS — D62 Acute posthemorrhagic anemia: Secondary | ICD-10-CM | POA: Diagnosis present

## 2012-04-06 DIAGNOSIS — K922 Gastrointestinal hemorrhage, unspecified: Secondary | ICD-10-CM

## 2012-04-06 DIAGNOSIS — E871 Hypo-osmolality and hyponatremia: Secondary | ICD-10-CM

## 2012-04-06 DIAGNOSIS — I252 Old myocardial infarction: Secondary | ICD-10-CM

## 2012-04-06 DIAGNOSIS — I2510 Atherosclerotic heart disease of native coronary artery without angina pectoris: Secondary | ICD-10-CM | POA: Diagnosis present

## 2012-04-06 DIAGNOSIS — R0600 Dyspnea, unspecified: Secondary | ICD-10-CM

## 2012-04-06 DIAGNOSIS — I509 Heart failure, unspecified: Secondary | ICD-10-CM | POA: Diagnosis present

## 2012-04-06 DIAGNOSIS — I5030 Unspecified diastolic (congestive) heart failure: Secondary | ICD-10-CM

## 2012-04-06 DIAGNOSIS — D649 Anemia, unspecified: Secondary | ICD-10-CM

## 2012-04-06 DIAGNOSIS — E119 Type 2 diabetes mellitus without complications: Secondary | ICD-10-CM

## 2012-04-06 DIAGNOSIS — Z880 Allergy status to penicillin: Secondary | ICD-10-CM

## 2012-04-06 DIAGNOSIS — K921 Melena: Secondary | ICD-10-CM

## 2012-04-06 DIAGNOSIS — Z9981 Dependence on supplemental oxygen: Secondary | ICD-10-CM

## 2012-04-06 DIAGNOSIS — H6120 Impacted cerumen, unspecified ear: Secondary | ICD-10-CM

## 2012-04-06 DIAGNOSIS — D131 Benign neoplasm of stomach: Secondary | ICD-10-CM | POA: Diagnosis present

## 2012-04-06 DIAGNOSIS — Z7982 Long term (current) use of aspirin: Secondary | ICD-10-CM

## 2012-04-06 DIAGNOSIS — I499 Cardiac arrhythmia, unspecified: Secondary | ICD-10-CM

## 2012-04-06 DIAGNOSIS — J4489 Other specified chronic obstructive pulmonary disease: Secondary | ICD-10-CM | POA: Diagnosis present

## 2012-04-06 DIAGNOSIS — F3289 Other specified depressive episodes: Secondary | ICD-10-CM | POA: Diagnosis present

## 2012-04-06 DIAGNOSIS — F329 Major depressive disorder, single episode, unspecified: Secondary | ICD-10-CM | POA: Diagnosis present

## 2012-04-06 DIAGNOSIS — Z87891 Personal history of nicotine dependence: Secondary | ICD-10-CM

## 2012-04-06 DIAGNOSIS — Z79899 Other long term (current) drug therapy: Secondary | ICD-10-CM

## 2012-04-06 DIAGNOSIS — Z951 Presence of aortocoronary bypass graft: Secondary | ICD-10-CM

## 2012-04-06 HISTORY — DX: Adverse effect of unspecified anesthetic, initial encounter: T41.45XA

## 2012-04-06 HISTORY — DX: Other complications of anesthesia, initial encounter: T88.59XA

## 2012-04-06 LAB — CBC WITH DIFFERENTIAL/PLATELET
Basophils Absolute: 0 10*3/uL (ref 0.0–0.1)
Basophils Relative: 0.1 % (ref 0.0–3.0)
Eosinophils Relative: 0.6 % (ref 0.0–5.0)
Hemoglobin: 6.3 g/dL — CL (ref 13.0–17.0)
Lymphocytes Relative: 14.1 % (ref 12.0–46.0)
Monocytes Relative: 7.1 % (ref 3.0–12.0)
Neutro Abs: 7.9 10*3/uL — ABNORMAL HIGH (ref 1.4–7.7)
RBC: 2.57 Mil/uL — ABNORMAL LOW (ref 4.22–5.81)

## 2012-04-06 LAB — PREPARE RBC (CROSSMATCH)

## 2012-04-06 LAB — BASIC METABOLIC PANEL
BUN: 15 mg/dL (ref 6–23)
CO2: 25 mEq/L (ref 19–32)
Chloride: 104 mEq/L (ref 96–112)
Creatinine, Ser: 0.87 mg/dL (ref 0.50–1.35)

## 2012-04-06 LAB — HEMOGLOBIN: Hemoglobin: 5.7 g/dL — CL (ref 13.0–17.0)

## 2012-04-06 LAB — CARDIAC PANEL(CRET KIN+CKTOT+MB+TROPI)
Relative Index: INVALID (ref 0.0–2.5)
Total CK: 60 U/L (ref 7–232)

## 2012-04-06 MED ORDER — SODIUM CHLORIDE 0.9 % IV SOLN
INTRAVENOUS | Status: AC
  Administered 2012-04-06: 18:00:00 via INTRAVENOUS

## 2012-04-06 MED ORDER — HYDROCODONE-ACETAMINOPHEN 5-325 MG PO TABS
1.0000 | ORAL_TABLET | Freq: Four times a day (QID) | ORAL | Status: DC | PRN
  Administered 2012-04-07 – 2012-04-14 (×12): 1 via ORAL
  Filled 2012-04-06 (×12): qty 1

## 2012-04-06 MED ORDER — METOPROLOL TARTRATE 25 MG PO TABS
25.0000 mg | ORAL_TABLET | Freq: Two times a day (BID) | ORAL | Status: DC
  Administered 2012-04-06 – 2012-04-14 (×15): 25 mg via ORAL
  Filled 2012-04-06 (×18): qty 1

## 2012-04-06 MED ORDER — INSULIN ASPART 100 UNIT/ML ~~LOC~~ SOLN
0.0000 [IU] | Freq: Three times a day (TID) | SUBCUTANEOUS | Status: DC
  Administered 2012-04-07 – 2012-04-08 (×4): 2 [IU] via SUBCUTANEOUS
  Administered 2012-04-08 – 2012-04-10 (×4): 3 [IU] via SUBCUTANEOUS
  Administered 2012-04-10 – 2012-04-11 (×3): 2 [IU] via SUBCUTANEOUS
  Administered 2012-04-11 – 2012-04-12 (×5): 3 [IU] via SUBCUTANEOUS
  Administered 2012-04-13 (×3): 2 [IU] via SUBCUTANEOUS
  Administered 2012-04-14: 3 [IU] via SUBCUTANEOUS

## 2012-04-06 MED ORDER — SODIUM CHLORIDE 0.9 % IJ SOLN
3.0000 mL | Freq: Two times a day (BID) | INTRAMUSCULAR | Status: DC
  Administered 2012-04-07 – 2012-04-14 (×14): 3 mL via INTRAVENOUS

## 2012-04-06 MED ORDER — SODIUM CHLORIDE 0.9 % IV SOLN
8.0000 mg/h | INTRAVENOUS | Status: DC
  Administered 2012-04-06 – 2012-04-07 (×2): 8 mg/h via INTRAVENOUS
  Filled 2012-04-06 (×3): qty 80

## 2012-04-06 MED ORDER — ATORVASTATIN CALCIUM 20 MG PO TABS
20.0000 mg | ORAL_TABLET | Freq: Every day | ORAL | Status: DC
  Administered 2012-04-07 – 2012-04-13 (×7): 20 mg via ORAL
  Filled 2012-04-06 (×8): qty 1

## 2012-04-06 MED ORDER — ALBUTEROL SULFATE (5 MG/ML) 0.5% IN NEBU
2.5000 mg | INHALATION_SOLUTION | Freq: Four times a day (QID) | RESPIRATORY_TRACT | Status: DC
  Administered 2012-04-06 – 2012-04-07 (×5): 2.5 mg via RESPIRATORY_TRACT
  Filled 2012-04-06 (×5): qty 0.5

## 2012-04-06 MED ORDER — POLYSACCHARIDE IRON COMPLEX 150 MG PO CAPS
150.0000 mg | ORAL_CAPSULE | Freq: Two times a day (BID) | ORAL | Status: DC
  Administered 2012-04-06 – 2012-04-09 (×6): 150 mg via ORAL
  Filled 2012-04-06 (×10): qty 1

## 2012-04-06 MED ORDER — ZOLPIDEM TARTRATE 10 MG PO TABS
10.0000 mg | ORAL_TABLET | Freq: Every day | ORAL | Status: DC
  Administered 2012-04-06 – 2012-04-13 (×8): 10 mg via ORAL
  Filled 2012-04-06 (×8): qty 1

## 2012-04-06 MED ORDER — BUDESONIDE-FORMOTEROL FUMARATE 160-4.5 MCG/ACT IN AERO
2.0000 | INHALATION_SPRAY | Freq: Two times a day (BID) | RESPIRATORY_TRACT | Status: DC
  Administered 2012-04-06 – 2012-04-14 (×15): 2 via RESPIRATORY_TRACT
  Filled 2012-04-06: qty 6

## 2012-04-06 MED ORDER — ALPRAZOLAM 0.25 MG PO TABS
0.2500 mg | ORAL_TABLET | Freq: Three times a day (TID) | ORAL | Status: DC | PRN
  Administered 2012-04-06 – 2012-04-13 (×11): 0.25 mg via ORAL
  Filled 2012-04-06 (×11): qty 1

## 2012-04-06 MED ORDER — CITALOPRAM HYDROBROMIDE 10 MG PO TABS
10.0000 mg | ORAL_TABLET | Freq: Every day | ORAL | Status: DC
  Administered 2012-04-07 – 2012-04-14 (×7): 10 mg via ORAL
  Filled 2012-04-06 (×8): qty 1

## 2012-04-06 MED ORDER — FOLIC ACID 1 MG PO TABS
1.0000 mg | ORAL_TABLET | Freq: Every day | ORAL | Status: DC
  Administered 2012-04-07: 1 mg via ORAL
  Filled 2012-04-06: qty 1

## 2012-04-06 MED ORDER — IPRATROPIUM BROMIDE 0.02 % IN SOLN
0.5000 mg | Freq: Four times a day (QID) | RESPIRATORY_TRACT | Status: DC
  Administered 2012-04-06 – 2012-04-07 (×5): 0.5 mg via RESPIRATORY_TRACT
  Filled 2012-04-06 (×4): qty 2.5

## 2012-04-06 MED ORDER — FUROSEMIDE 10 MG/ML IJ SOLN
40.0000 mg | Freq: Once | INTRAMUSCULAR | Status: AC
  Administered 2012-04-06: 40 mg via INTRAVENOUS
  Filled 2012-04-06: qty 4

## 2012-04-06 NOTE — ED Notes (Signed)
[id number]<IO> Expected date:<BR> Expected time:<BR> Means of arrival:<BR> Comments:<BR> Hold for pt hbg 6.3, recent gi bleed

## 2012-04-06 NOTE — ED Notes (Signed)
Pt reports he was sent here by his PMD, reports that he was told that his Hgb is 6.1.  Pt reports recent admission to the hospital last week, had 2 units of PRBC transfusions.  Pt reports he had an endo done last week as well, performed by [name], reports bleeding polyps, states that [name] was cauterizing the polyps but states that he did not finish with the procedure d/t pt getting nauseous.  Pt is A&Ox4.  Pt denies any abd pain.

## 2012-04-06 NOTE — Telephone Encounter (Signed)
[facility] called critical HGB-6.3, HCT-20.6, results given to Dr Elmer Sow nurse, Laveda Abbe

## 2012-04-06 NOTE — Progress Notes (Signed)
Pt noted to have d/c home on 03/28/12 with  [facility]) ED CM notified [facility] staff of pt re admission via text

## 2012-04-06 NOTE — Progress Notes (Signed)
  Subjective:    Patient ID: [name], male    DOB: 09/16/1941, 71 y.o.   MRN: [id number]  HPI            Review of Systems     Objective:   Physical Exam        Assessment & Plan:

## 2012-04-06 NOTE — Telephone Encounter (Signed)
Noted.  See result note.  
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
## 2012-04-06 NOTE — Patient Instructions (Addendum)
I will call you as soon as I get your lab results. If your symptoms worsen, please go straight to the ER over the weekend.

## 2012-04-07 ENCOUNTER — Encounter (HOSPITAL_COMMUNITY)
Admission: EM | Disposition: A | Discharge status: Home / Self Care | Payer: Self-pay | Source: Home / Self Care | Attending: Internal Medicine

## 2012-04-07 DIAGNOSIS — E871 Hypo-osmolality and hyponatremia: Secondary | ICD-10-CM

## 2012-04-07 DIAGNOSIS — J438 Other emphysema: Secondary | ICD-10-CM

## 2012-04-07 DIAGNOSIS — I5032 Chronic diastolic (congestive) heart failure: Secondary | ICD-10-CM

## 2012-04-07 DIAGNOSIS — K922 Gastrointestinal hemorrhage, unspecified: Secondary | ICD-10-CM

## 2012-04-07 HISTORY — PX: ESOPHAGOGASTRODUODENOSCOPY: SHX5428

## 2012-04-07 LAB — HEMOGLOBIN
Hemoglobin: 7.3 g/dL — ABNORMAL LOW (ref 13.0–17.0)
Hemoglobin: 8.5 g/dL — ABNORMAL LOW (ref 13.0–17.0)

## 2012-04-07 LAB — BASIC METABOLIC PANEL
BUN: 9 mg/dL (ref 6–23)
CO2: 29 mEq/L (ref 19–32)
Chloride: 102 mEq/L (ref 96–112)
Creatinine, Ser: 0.78 mg/dL (ref 0.50–1.35)
GFR calc Af Amer: >90 mL/min (ref >90)
GFR calc non Af Amer: 89 mL/min — ABNORMAL LOW (ref >90)
Glucose, Bld: 184 mg/dL — ABNORMAL HIGH (ref 70–99)
Potassium: 3.9 mEq/L (ref 3.5–5.1)
Potassium: 3.8 mEq/L (ref 3.5–5.1)
Sodium: 138 mEq/L (ref 135–145)

## 2012-04-07 LAB — CBC
HCT: 25.4 % — ABNORMAL LOW (ref 39.0–52.0)
Hemoglobin: 7.5 g/dL — ABNORMAL LOW (ref 13.0–17.0)
MCH: 25.3 pg — ABNORMAL LOW (ref 26.0–34.0)
MCV: 85.8 fL (ref 78.0–100.0)
Platelets: 205 10*3/uL (ref 150–400)
RBC: 2.96 MIL/uL — ABNORMAL LOW (ref 4.22–5.81)
WBC: 7.7 10*3/uL (ref 4.0–10.5)

## 2012-04-07 LAB — GLUCOSE, CAPILLARY
Glucose-Capillary: 177 mg/dL — ABNORMAL HIGH (ref 70–99)
Glucose-Capillary: 237 mg/dL — ABNORMAL HIGH (ref 70–99)

## 2012-04-07 LAB — HEMATOCRIT
HCT: 24.5 % — ABNORMAL LOW (ref 39.0–52.0)
HCT: 28.1 % — ABNORMAL LOW (ref 39.0–52.0)

## 2012-04-07 LAB — MAGNESIUM: Magnesium: 2 mg/dL (ref 1.5–2.5)

## 2012-04-07 LAB — PREPARE RBC (CROSSMATCH)

## 2012-04-07 SURGERY — EGD (ESOPHAGOGASTRODUODENOSCOPY)
Anesthesia: Moderate Sedation

## 2012-04-07 MED ORDER — LIDOCAINE VISCOUS 2 % MT SOLN
OROMUCOSAL | Status: DC | PRN
  Administered 2012-04-07: 10 mL via OROMUCOSAL

## 2012-04-07 MED ORDER — MIDAZOLAM HCL 10 MG/2ML IJ SOLN
INTRAMUSCULAR | Status: DC | PRN
  Administered 2012-04-07: 2 mg via INTRAVENOUS
  Administered 2012-04-07: 1 mg via INTRAVENOUS
  Administered 2012-04-07: 2 mg via INTRAVENOUS

## 2012-04-07 MED ORDER — PANTOPRAZOLE SODIUM 40 MG IV SOLR
40.0000 mg | Freq: Two times a day (BID) | INTRAVENOUS | Status: DC
  Administered 2012-04-07 – 2012-04-14 (×13): 40 mg via INTRAVENOUS
  Filled 2012-04-07 (×15): qty 40

## 2012-04-07 MED ORDER — SODIUM CHLORIDE 0.9 % IV SOLN: Freq: Once | INTRAVENOUS | Status: DC

## 2012-04-07 MED ORDER — IPRATROPIUM BROMIDE 0.02 % IN SOLN: 0.5000 mg | Freq: Four times a day (QID) | RESPIRATORY_TRACT | Status: DC | PRN

## 2012-04-07 MED ORDER — ALBUTEROL SULFATE (5 MG/ML) 0.5% IN NEBU: 2.5000 mg | INHALATION_SOLUTION | Freq: Four times a day (QID) | RESPIRATORY_TRACT | Status: DC

## 2012-04-07 MED ORDER — FENTANYL NICU IV SYRINGE 50 MCG/ML
INJECTION | INTRAMUSCULAR | Status: DC | PRN
  Administered 2012-04-07 (×2): 25 ug via INTRAVENOUS
  Administered 2012-04-07: 10 ug via INTRAVENOUS

## 2012-04-07 MED ORDER — FUROSEMIDE 20 MG PO TABS
20.0000 mg | ORAL_TABLET | Freq: Every day | ORAL | Status: DC
  Administered 2012-04-07 – 2012-04-14 (×7): 20 mg via ORAL
  Filled 2012-04-07 (×9): qty 1

## 2012-04-07 MED ORDER — ALBUTEROL SULFATE (5 MG/ML) 0.5% IN NEBU: 2.5000 mg | INHALATION_SOLUTION | Freq: Four times a day (QID) | RESPIRATORY_TRACT | Status: DC | PRN

## 2012-04-07 NOTE — Progress Notes (Signed)
Subjective: Patient denies any specific symptoms. No further dizziness. Still tired. Received 2 U prbc, hb of 7.5, give another unit this am. Planned for EGD this afternoon.  Objective:  Vital signs in last 24 hours:  Filed Vitals:   04/07/12 1323 04/07/12 1413 04/07/12 1420 04/07/12 1449  BP: 122/83  124/80 131/79  Pulse: 80  78 80  Temp: 97.7 F (36.5 C)  98 F (36.7 C) 97.3 F (36.3 C)  TempSrc: Oral  Oral Oral  Resp: 18  18 13   Height:      Weight:      SpO2:  99%      Intake/Output from previous day:   Intake/Output Summary (Last 24 hours) at 04/07/12 1459 Last data filed at 04/07/12 1449  Gross per 24 hour  Intake   2875 ml  Output   3450 ml  Net   -575 ml    Physical Exam:  General: elderly obese male in no acute distress. HEENT:  pallor +, no icterus, moist oral mucosa, no JVD, no lymphadenopathy Cardiovascular: RRR, S1 normal, S2 normal, no MRG, pulses symmetric and intact bilaterally  Pulmonary/Chest: CTAB, no wheezes, rales, or rhonchi  Abdominal: Soft. Non-tender, non-distended, bowel sounds are normal, no masses, organomegaly, or guarding present.  GU: no CVA tenderness Musculoskeletal: No joint deformities, erythema, or stiffness, ROM full and no nontender Ext: 1+ edema ,no cyanosis, pulses palpable bilaterally (DP and PT)  Hematology: no cervical, inginal, or axillary adenopathy.  Neurological: A&O x3, Strenght is normal and symmetric bilaterally, cranial nerve II-XII are grossly intact, no focal motor deficit, sensory intact to light touch bilaterally.  Skin: Warm, dry and intact. No rash, cyanosis, or clubbing.  Psychiatric: Normal mood and affect. speech and behavior is normal. Judgment and thought content normal. Cognition and memory are normal.       Lab Results:  Basic Metabolic Panel:    Component Value Date/Time   NA 138 04/07/2012 0620   K 3.9 04/07/2012 0620   CL 102 04/07/2012 0620   CO2 29 04/07/2012 0620   BUN 11 04/07/2012 0620   CREATININE 0.85 04/07/2012 0620   GLUCOSE 184* 04/07/2012 0620   CALCIUM 7.8* 04/07/2012 0620   CBC:    Component Value Date/Time   WBC 7.7 04/07/2012 0620   HGB 7.3* 04/07/2012 0951   HCT 24.5* 04/07/2012 0951   PLT 205 04/07/2012 0620   MCV 85.8 04/07/2012 0620   NEUTROABS 7.9* 04/06/2012 1336   LYMPHSABS 1.4 04/06/2012 1336   MONOABS 0.7 04/06/2012 1336   EOSABS 0.1 04/06/2012 1336   BASOSABS 0.0 04/06/2012 1336    No results found for this or any previous visit (from the past 240 hour(s)).  Studies/Results: No results found.  Medications: Scheduled Meds:   . sodium chloride   Intravenous STAT  . ipratropium  0.5 mg Nebulization Q6H   And  . albuterol  2.5 mg Nebulization Q6H  . atorvastatin  20 mg Oral q1800  . budesonide-formoterol  2 puff Inhalation BID  . citalopram  10 mg Oral Daily  . folic acid  1 mg Oral Daily  . furosemide  40 mg Intravenous Once  . insulin aspart  0-15 Units Subcutaneous TID WC  . iron polysaccharides  150 mg Oral BID  . metoprolol tartrate  25 mg Oral BID  . sodium chloride  3 mL Intravenous Q12H  . zolpidem  10 mg Oral QHS   Continuous Infusions:   . pantoprozole (PROTONIX) infusion 8 mg/hr (04/07/12 0959)  PRN Meds:.ALPRAZolam, HYDROcodone-acetaminophen  Assessment/Plan: 71 y o Male with h/o severe COPD, CAD with hx of MI and CABG in 12/2008, EF >55% with grade  1 diastolic dysfxn , , h/o 7 cm AAA s/p endovascular repair and coiling right  hypogastric artery 12/2008, and prior GI evaluation for acute anemia with unclear source with recent hospitalization 10 days back for anemia and EGD showing duodenal AVM and one bleeding duodenal polyp presented with dizziness and fatigue for past 7 days with drop in hemoglobin to 6 gm.  Marland Kitchenacute blood loss Anemia  possible for recurrence of bleeding AVMs  Patient was taking higher doses of ASA and ? alleve for pain in his neck for past 2 days  Stable on telemetry   2 U prbc given on admission, hb only 7.5 ,  ordered 1 more unit today. -serial H&h q 8 hour  - on protonix drip  -clear liquids  - hold ASA and any NSAIDs  -continue home iron and folate  -planned for EGD this afternoon by Dr Loreta Ave  Shortness of breath  Seems to be related to advanced COPD with associated anemia  continue 02 via [place]  duonebs q 6hrs prn  Continue home symbicort  Add low dose lasix  Hx of diastolic CHF and CAD  holding ASA, continue statin and BB  Add low dose lasix  DM  Holding metformin , place on SSI   DVT prophylaxis: SCD   Diet: clears   Full code        LOS: 1 day   [name] 04/07/2012, 2:59 PM

## 2012-04-07 NOTE — Op Note (Signed)
[facility] [address] [place], [place]  [address]  OPERATIVE PROCEDURE REPORT  PATIENT:  Julias, Mould  MR#:  [id number] BIRTHDATE:  09-03-1941  GENDER:  male ENDOSCOPIST:  Dr. Lorenza Burton, MD ASSISTANT:  Jeneen Montgomery, RN and Willy Eddy, Cindie Laroche.  PROCEDURE DATE:  04/07/2012 PRE-PROCEDURE PREPERATION:  Patient fasted for 4 hours prior to procedure. PRE-PROCEDURE PHYSICAL:  Patient has stable vital signs. Neck is supple. There is no JVD, thyromegaly or LAD. Chest clear to auscultation. S1 and S2 regular; decreased breath sounds at the bases. Abdomen soft, morbidly obese, non-distended, non-tender with NABS. PROCEDURE:  EGD, diagnostic ASA CLASS:  Class IV INDICATIONS:  1) Iron deficiency anemia 2) Blood in stool. MEDICATIONS:  Fentanyl 60 mcg & Versed 5 mg IV. TOPICAL ANESTHETIC:  Viscous xylocaine-10 cc PO.  DESCRIPTION OF PROCEDURE:  After the risks benefits and alternatives of the procedure were thoroughly explained, informed consent was obtained. The Pentax video gastroscope [id number] was introduced through the mouth and advanced to the second portion of the duodenum, without limitations. The instrument was slowly withdrawn as the mucosa was fully examined. <<PROCEDUREIMAGES>>  The esophagus and the GEJ were widely patent and appeared normal. There were multiple sessile polyps in the midbody of the stomach. The  proximal small bowel appeared normal. except for a resolution clip noted in the second part of the duodenum. There were no ulcers, erosions or masses noted. Retroflexed views revealed no abnormalities. There was no evidence of any fresh or old blood in the stomach and the proximal small bowel. The scope was then withdrawn from the patient and the procedure terminated. The patient tolerated the procedure without immediate complications.  IMPRESSION:  1) Normal appearing esophagus and GEJ. 2) Multiple sessile gastric polyps. 3) Resolution  clipn in D2. 4) No fresh or old blood in the stomach or proximal small bowel.  RECOMMENDATIONS:  1) Anti-reflux regimen to be followed. 2) Avoid NSAIDS for now. 3) Continue PPI's.  REPEAT EXAM:  None planned for now.  DISCHARGE INSTRUCTIONS: Standard discharge instructions given.  ______________________________ Dr. Lorenza Burton, MD  CPT CODES: 19147  DIAGNOSIS CODES: 280.9, 578.1  n. eSIGNED:   Dr. Lorenza Burton at 04/07/2012 03:56 PM  Christ Kick, [phone]

## 2012-04-07 NOTE — Progress Notes (Signed)
Report received from Eva Cooke, RN. No change from initial am assessment. Will continue to monitor and follow plan of care. 
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
## 2012-04-08 DIAGNOSIS — K922 Gastrointestinal hemorrhage, unspecified: Secondary | ICD-10-CM

## 2012-04-08 DIAGNOSIS — J438 Other emphysema: Secondary | ICD-10-CM

## 2012-04-08 DIAGNOSIS — I5032 Chronic diastolic (congestive) heart failure: Secondary | ICD-10-CM

## 2012-04-08 DIAGNOSIS — E871 Hypo-osmolality and hyponatremia: Secondary | ICD-10-CM

## 2012-04-08 LAB — TYPE AND SCREEN: Unit division: 0

## 2012-04-08 LAB — HEMATOCRIT
HCT: 30 % — ABNORMAL LOW (ref 39.0–52.0)
HCT: 32.5 % — ABNORMAL LOW (ref 39.0–52.0)

## 2012-04-08 LAB — GLUCOSE, CAPILLARY

## 2012-04-08 LAB — HEMOGLOBIN
Hemoglobin: 9.2 g/dL — ABNORMAL LOW (ref 13.0–17.0)
Hemoglobin: 9.8 g/dL — ABNORMAL LOW (ref 13.0–17.0)

## 2012-04-08 NOTE — Progress Notes (Signed)
Subjective: Patient feels better today. Denies any dizziness or fatigue.   Objective:  Vital signs in last 24 hours:  Filed Vitals:   04/07/12 2024 04/07/12 2100 04/08/12 0517 04/08/12 0855  BP:  130/83 125/85   Pulse:  77 75   Temp:  98 F (36.7 C) 97.8 F (36.6 C)   TempSrc:  Oral Oral   Resp:  20 20   Height:      Weight:      SpO2: 96% 91% 93% 92%    Intake/Output from previous day:   Intake/Output Summary (Last 24 hours) at 04/08/12 1003 Last data filed at 04/08/12 0900  Gross per 24 hour  Intake 1659.17 ml  Output   2575 ml  Net -915.83 ml    Physical Exam: General: elderly obese male in no acute distress.  HEENT: no pallor, no icterus, moist oral mucosa, no JVD, no lymphadenopathy  Cardiovascular: RRR, S1 normal, S2 normal, no MRG, pulses symmetric and intact bilaterally  Pulmonary/Chest: CTAB, no wheezes, rales, or rhonchi  Abdominal: Soft. Non-tender, non-distended, bowel sounds are normal, no masses, organomegaly, or guarding present.  GU: no CVA tenderness Musculoskeletal: No joint deformities, erythema, or stiffness, ROM full and no nontender Ext: warm, no edema ,no cyanosis, pulses palpable bilaterally (DP and PT)  Hematology: no cervical, inginal, or axillary adenopathy.  Neurological: A&O x3, Strenght is normal and symmetric bilaterally, cranial nerve II-XII are grossly intact, no focal motor deficit, sensory intact to light touch bilaterally.  Skin: Warm, dry and intact. No rash, cyanosis, or clubbing.  Psychiatric: Normal mood and affect. speech and behavior is normal. Judgment and thought content normal. Cognition and memory are normal.        Lab Results:  Basic Metabolic Panel:    Component Value Date/Time   NA 138 04/07/2012 1615   K 3.8 04/07/2012 1615   CL 103 04/07/2012 1615   CO2 27 04/07/2012 1615   BUN 9 04/07/2012 1615   CREATININE 0.78 04/07/2012 1615   GLUCOSE 138* 04/07/2012 1615   CALCIUM 7.7* 04/07/2012 1615   CBC:      Component Value Date/Time   WBC 7.7 04/07/2012 0620   HGB 9.8* 04/08/2012 0915   HCT 32.5* 04/08/2012 0915   PLT 205 04/07/2012 0620   MCV 85.8 04/07/2012 0620   NEUTROABS 7.9* 04/06/2012 1336   LYMPHSABS 1.4 04/06/2012 1336   MONOABS 0.7 04/06/2012 1336   EOSABS 0.1 04/06/2012 1336   BASOSABS 0.0 04/06/2012 1336    No results found for this or any previous visit (from the past 240 hour(s)).  Studies/Results: No results found.  Medications: Scheduled Meds:   . atorvastatin  20 mg Oral q1800  . budesonide-formoterol  2 puff Inhalation BID  . citalopram  10 mg Oral Daily  . furosemide  20 mg Oral Daily  . insulin aspart  0-15 Units Subcutaneous TID WC  . iron polysaccharides  150 mg Oral BID  . metoprolol tartrate  25 mg Oral BID  . pantoprazole (PROTONIX) IV  40 mg Intravenous Q12H  . sodium chloride  3 mL Intravenous Q12H  . zolpidem  10 mg Oral QHS  . DISCONTD: sodium chloride   Intravenous Once  . DISCONTD: sodium chloride   Intravenous Once  . DISCONTD: albuterol  2.5 mg Nebulization Q6H  . DISCONTD: albuterol  2.5 mg Nebulization Q6H  . DISCONTD: folic acid  1 mg Oral Daily  . DISCONTD: ipratropium  0.5 mg Nebulization Q6H   Continuous Infusions:   .  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    Plan: .acute blood loss Anemia   Patient was taking higher doses of ASA and ? alleve for pain in his neck for past 2 days  Total 3 units PRBC given since admission  -serial H&h q 8 hour  - EGD done by Dr Loreta Ave on 6/22 shows numerous sessile gastric polyps, no fresh or old blood in stomach or duodenum. No clear source of bleeding. patient's H&h currently stable. He had capsule endoscopy done by Dr Janna Arch in [place] about 2 years back. i will call him tomorrow to discuss the results and plan on further testing. - hold ASA and any NSAIDs  -continue home iron and folate   Shortness of breath  Seems to be related to advanced COPD with associated anemia  continue 02 via [place]  duonebs q 6hrs prn  Continue home symbicort   low dose lasix   Hx of diastolic CHF and CAD  holding ASA, continue statin and BB  Continue  low dose lasix   DM  Holding metformin ,on SSI   DVT prophylaxis: SCD   Diet: diabetic  Full code       LOS: 2 days   [name] 04/08/2012, 10:03 AM

## 2012-04-08 NOTE — Progress Notes (Signed)
Subjective: Since I last evaluated the patient, he seems to be stable from a GI standpoint. No further evidence of bleeding.   Objective: Vital signs in last 24 hours: Temp:  [97.3 F (36.3 C)-98 F (36.7 C)] 97.8 F (36.6 C) (06/23 0517) Pulse Rate:  [75-80] 75  (06/23 0517) Resp:  [9-22] 20  (06/23 0517) BP: (122-169)/(62-92) 125/85 mmHg (06/23 0517) SpO2:  [91 %-99 %] 92 % (06/23 0855) Last BM Date: 04/07/12  Intake/Output from previous day: 06/22 0701 - 06/23 0700 In: 2019.2 [P.O.:1080; I.V.:596.7; Blood:332.5; IV Piggyback:10] Out: 2625 [Urine:2625] Intake/Output this shift: Total I/O In: 600 [P.O.:600] Out: 326 [Urine:325; Stool:1]  General appearance: alert, cooperative, appears older than stated age, no distress and moderately obese Resp: clear to auscultation bilaterally  Cardio: regular rate and rhythm, S1, S2 normal, no murmur, click, rub or gallop GI: soft, morbidly obese, non-tender; bowel sounds normal; no masses,  no organomegaly  Lab Results:  Basename 04/08/12 0915 04/08/12 0140 04/07/12 1615 04/07/12 0620 04/06/12 1336  WBC -- -- -- 7.7 10.1  HGB 9.8* 9.2* 8.5* -- --  HCT 32.5* 30.0* 28.1* -- --  PLT -- -- -- 205 238.0   BMET  Basename 04/07/12 1615 04/07/12 0620 04/06/12 1610  NA 138 138 139  K 3.8 3.9 3.7  CL 103 102 104  CO2 27 29 25   GLUCOSE 138* 184* 158*  BUN 9 11 15   CREATININE 0.78 0.85 0.87  CALCIUM 7.7* 7.8* 8.4   LFT  Basename 04/08/12 1158  PROT --  ALBUMIN 3.4*  AST --  ALT --  ALKPHOS --  BILITOT --  BILIDIR --  IBILI --   PT/INR  Basename 04/07/12 0620  LABPROT 16.9*  INR 1.35    Medications: I have reviewed the patient's current medications.  Assessment/Plan: Anemia with GI blood loss ?source of bleeding still unclear. We will wait to get the results of the capsule study done BY Dr. Janna Arch in [place], [place] and make further recommendations as needed.  LOS: 2 days   [name] 04/08/2012, 1:01 PM

## 2012-04-09 ENCOUNTER — Encounter (HOSPITAL_COMMUNITY): Payer: Self-pay | Admitting: Gastroenterology

## 2012-04-09 DIAGNOSIS — J438 Other emphysema: Secondary | ICD-10-CM

## 2012-04-09 DIAGNOSIS — E871 Hypo-osmolality and hyponatremia: Secondary | ICD-10-CM

## 2012-04-09 DIAGNOSIS — K922 Gastrointestinal hemorrhage, unspecified: Secondary | ICD-10-CM

## 2012-04-09 DIAGNOSIS — I5032 Chronic diastolic (congestive) heart failure: Secondary | ICD-10-CM

## 2012-04-09 LAB — GLUCOSE, CAPILLARY: Glucose-Capillary: 145 mg/dL — ABNORMAL HIGH (ref 70–99)

## 2012-04-09 NOTE — Progress Notes (Signed)
Refusing his vitals to be taken this am also, remains angry & non-compliant.

## 2012-04-09 NOTE — Progress Notes (Signed)
Pt requesting to walk in hallway. Ambulated down hallway with walker and without O2. Steady gait, sats maintained above 90% on rm air. Halfway through had to find a taller walker, bending over caused pt's hips to start to hurt. Pain med given when pt back in room. Tolerated well.   Rosalba Totty, [place] Edwards RN

## 2012-04-09 NOTE — Progress Notes (Signed)
04/09/12-refused to let the lab tech draw his blood this am, still angry & uncooperative & non-compliant, but has slept & snored most of night, since he went to sleep after his temper tantrum over not getting the ice cream, due to CBG being 210 tonight & already consumed a hs snack prior to asking for the ice cream. His telemetry monitor remains off.

## 2012-04-09 NOTE — Progress Notes (Signed)
Nutrition Consult for education  Chart reviewed.  Pt with "Pre diabetes" per pt on Glucophage prior to admit.  HgbAIC=6.7.  Ht:  6'3", Wt:  273# BMI:  34.5  Pt reports that he wants to lose 75# but does not have the energy to exercise.  Diet CHO MOD MED.  Pt with good intake and states that he is getting adequate food but a confusion about his snack last night caused many problems resulting in the pt becoming angry, removing his O2, refusing meds, etc.    Educated pt on DM diet.  Pt able to verbalize changes to make.  Discussed importance of balanced meals/CHO.   Written info with RD name and number provided.    Plan:  Continue CHO MOD MED diet with HS snack.  HS snack can have up to 45 grams of CHO.  Provide pt preferences within this range.    Jeoffrey Massed [phone]

## 2012-04-09 NOTE — Progress Notes (Signed)
04/08/12--22;40pm wanted ice cream, but pt had already had his hs snack of cereal & milk, His HS CBG=210 tonight. The pt was instructed by the secretary that he could not have any ice cream, due to his diet restrictions since he had his HS snack already...she was instructed to tell the pt this, from me, as I was tending to another pt at the time. When I completed my tasks with the other pt.Marland KitchenMarland KitchenI went directly to his room about 23:00pm & told him the reasons, but he was angry & said"Bull Shit", I won't let you do anymore vitals on me or do anything for me, just leave me the "hell" alone! At 00:10 it was reported to me by tech that he had taken off his tele monitor, & he had put his clothes on, & was still angry...& told her the best thing for her to do was to leave him alone. I was called from tech's phone to tell me what was going on, I went to his room & ask him if he wasn't going to wear his tele monitor, then I would need to notify the dr..he said okay-Fine!!! Notified Tom Callahan,NP for [facility] & reported that pt refuses to wear the tele monitor, & pt is being non-compliant at this time with treatment  Plan. Will con't to observe pt. as per hospital protocal, without causing more pt. Dissatisfaction.

## 2012-04-09 NOTE — Progress Notes (Signed)
Subjective: Patient very angry about not getting to have an ice cream last night as the nurse refused since his fsg was high. Patient refused to be examined and get vitals checked or take his meds. after long discussion he agrees to see a nutritionist to go over his diet regimen. Denies dizziness or GI bleed  also received documents of his capsule endoscopy from 2011 done by Dr Janna Arch at [facility].   Objective:  Vital signs in last 24 hours:  Filed Vitals:   04/08/12 0855 04/08/12 1310 04/08/12 1941 04/08/12 2154  BP:  110/72  133/81  Pulse:  85  90  Temp:  97.8 F (36.6 C)  97.8 F (36.6 C)  TempSrc:  Oral  Oral  Resp:  20  20  Height:      Weight:      SpO2: 92% 95% 95% 97%    Intake/Output from previous day:   Intake/Output Summary (Last 24 hours) at 04/09/12 1205 Last data filed at 04/08/12 2155  Gross per 24 hour  Intake   1093 ml  Output    400 ml  Net    693 ml    Physical Exam: General: elderly obese male in no acute distress.  HEENT: no pallor, no icterus, moist oral mucosa, no JVD, no lymphadenopathy  Cardiovascular: RRR, S1 normal, S2 normal, no MRG, pulses symmetric and intact bilaterally  Pulmonary/Chest: CTAB, no wheezes, rales, or rhonchi  Abdominal: Soft. Non-tender, non-distended, bowel sounds are normal, no masses, organomegaly, or guarding present.  GU: no CVA tenderness Musculoskeletal: No joint deformities, erythema, or stiffness, ROM full and no nontender Ext: warm, no edema ,no cyanosis, pulses palpable bilaterally (DP and PT)  Hematology: no cervical, inginal, or axillary adenopathy.  Neurological: A&O x3, Strenght is normal and symmetric bilaterally, cranial nerve II-XII are grossly intact, no focal motor deficit, sensory intact to light touch bilaterally.  Skin: Warm, dry and intact. No rash, cyanosis, or clubbing.  Psychiatric: Normal mood and affect. speech and behavior is normal. Judgment and thought content normal. Cognition and memory are  normal     Lab Results:  Basic Metabolic Panel:    Component Value Date/Time   NA 138 04/07/2012 1615   K 3.8 04/07/2012 1615   CL 103 04/07/2012 1615   CO2 27 04/07/2012 1615   BUN 9 04/07/2012 1615   CREATININE 0.78 04/07/2012 1615   GLUCOSE 138* 04/07/2012 1615   CALCIUM 7.7* 04/07/2012 1615   CBC:    Component Value Date/Time   WBC 7.7 04/07/2012 0620   HGB 9.8* 04/08/2012 0915   HCT 32.5* 04/08/2012 0915   PLT 205 04/07/2012 0620   MCV 85.8 04/07/2012 0620   NEUTROABS 7.9* 04/06/2012 1336   LYMPHSABS 1.4 04/06/2012 1336   MONOABS 0.7 04/06/2012 1336   EOSABS 0.1 04/06/2012 1336   BASOSABS 0.0 04/06/2012 1336    No results found for this or any previous visit (from the past 240 hour(s)).  Studies/Results: No results found.  Medications: Scheduled Meds:   . atorvastatin  20 mg Oral q1800  . budesonide-formoterol  2 puff Inhalation BID  . citalopram  10 mg Oral Daily  . furosemide  20 mg Oral Daily  . insulin aspart  0-15 Units Subcutaneous TID WC  . iron polysaccharides  150 mg Oral BID  . metoprolol tartrate  25 mg Oral BID  . pantoprazole (PROTONIX) IV  40 mg Intravenous Q12H  . sodium chloride  3 mL Intravenous Q12H  . 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   Plan:  .acute blood loss Anemia  Patient was taking higher doses of ASA and ? alleve for pain in his neck for past 2 days  Total 3 units PRBC  given since admission  -serial H&h q 12 hr. Hb now improved - EGD done by Dr Loreta Ave on 6/22 shows numerous sessile gastric polyps, no fresh or old blood in stomach or duodenum. No clear source of bleeding. patient's H&h currently stable. He had capsule endoscopy done by Dr Janna Arch in [place] about 2 years back. report received today which shows a normal study. Discussed again with Dr Loreta Ave who plans for capsule endoscopy tomorrow. NPO after midnight - hold ASA and any NSAIDs  -continue home iron and folate   Shortness of breath  Seems to be related to advanced COPD with associated anemia  continue 02 via [place]  duonebs q 6hrs prn  Continue home symbicort  low dose lasix    Hx of diastolic CHF and CAD  holding ASA, continue statin and BB  Continue low dose lasix   DM   ,on SSI  Holding metformin Nutrition consult  DVT prophylaxis: SCD  Diet: diabetic  Full code         LOS: 3 days   [name] 04/09/2012, 12:05 PM

## 2012-04-10 ENCOUNTER — Encounter (HOSPITAL_COMMUNITY)
Admission: EM | Disposition: A | Discharge status: Home / Self Care | Payer: Self-pay | Source: Home / Self Care | Attending: Internal Medicine

## 2012-04-10 DIAGNOSIS — I5032 Chronic diastolic (congestive) heart failure: Secondary | ICD-10-CM

## 2012-04-10 DIAGNOSIS — K922 Gastrointestinal hemorrhage, unspecified: Secondary | ICD-10-CM

## 2012-04-10 DIAGNOSIS — J438 Other emphysema: Secondary | ICD-10-CM

## 2012-04-10 HISTORY — PX: GIVENS CAPSULE STUDY: SHX5432

## 2012-04-10 SURGERY — IMAGING PROCEDURE, GI TRACT, INTRALUMINAL, VIA CAPSULE
Anesthesia: LOCAL

## 2012-04-10 MED ORDER — LORAZEPAM 2 MG/ML IJ SOLN: 1.0000 mg | Freq: Once | INTRAMUSCULAR | Status: AC

## 2012-04-10 MED ORDER — LORAZEPAM 2 MG/ML IJ SOLN
INTRAMUSCULAR | Status: AC
  Administered 2012-04-10: 1 mg
  Filled 2012-04-10: qty 1

## 2012-04-10 SURGICAL SUPPLY — 1 items: TOWEL COTTON PACK 4EA (MISCELLANEOUS) ×4 IMPLANT

## 2012-04-10 NOTE — Addendum Note (Signed)
Addended by: Dianne Dun on: 04/10/2012 03:10 PM   Modules accepted: Level of Service

## 2012-04-10 NOTE — Progress Notes (Signed)
Subjective: Patient denies any symptoms. Had capsule endoscopy done today.   Objective:  Vital signs in last 24 hours:  Filed Vitals:   04/10/12 0515 04/10/12 0830 04/10/12 0900 04/10/12 1300  BP: 145/88   127/75  Pulse: 76   74  Temp: 97.3 F (36.3 C)   98.1 F (36.7 C)  TempSrc: Oral     Resp: 18   17  Height:      Weight:      SpO2: 95% 90% 95% 97%    Intake/Output from previous day:   Intake/Output Summary (Last 24 hours) at 04/10/12 1738 Last data filed at 04/10/12 0700  Gross per 24 hour  Intake    240 ml  Output      0 ml  Net    240 ml    Physical Exam:   General: elderly obese male in no acute distress.  HEENT: no pallor, no icterus, moist oral mucosa Cardiovascular: RRR, S1 normal, S2 normal, no MRG, Pulmonary/Chest: CTAB, no wheezes, rales, or rhonchi  Abdominal: Soft. Non-tender, non-distended, bowel sounds are normal,  Ext: warm, no edema ,no cyanosis,  Hematology: no cervical, inginal, or axillary adenopathy.  Neurological: A&O x3, non focal   Lab Results:  Basic Metabolic Panel:    Component Value Date/Time   NA 138 04/07/2012 1615   K 3.8 04/07/2012 1615   CL 103 04/07/2012 1615   CO2 27 04/07/2012 1615   BUN 9 04/07/2012 1615   CREATININE 0.78 04/07/2012 1615   GLUCOSE 138* 04/07/2012 1615   CALCIUM 7.7* 04/07/2012 1615   CBC:    Component Value Date/Time   WBC 7.7 04/07/2012 0620   HGB 10.0* 04/09/2012 2006   HCT 33.5* 04/09/2012 2006   PLT 205 04/07/2012 0620   MCV 85.8 04/07/2012 0620   NEUTROABS 7.9* 04/06/2012 1336   LYMPHSABS 1.4 04/06/2012 1336   MONOABS 0.7 04/06/2012 1336   EOSABS 0.1 04/06/2012 1336   BASOSABS 0.0 04/06/2012 1336    No results found for this or any previous visit (from the past 240 hour(s)).  Studies/Results: No results found.  Medications: Scheduled Meds:   . atorvastatin  20 mg Oral q1800  . budesonide-formoterol  2 puff Inhalation BID  . citalopram  10 mg Oral Daily  . furosemide  20 mg Oral Daily  .  insulin aspart  0-15 Units Subcutaneous TID WC  . iron polysaccharides  150 mg Oral BID  . LORazepam      . LORazepam  1 mg Intravenous Once  . metoprolol tartrate  25 mg Oral BID  . pantoprazole (PROTONIX) IV  40 mg Intravenous Q12H  . sodium chloride  3 mL Intravenous Q12H  . 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   Plan:  .acute blood loss Anemia  Patient was taking higher doses of ASA and ? alleve for pain in his neck for past 2 days . Total 3 units PRBC  given since admission  -serial H&h shows improvement in symptoms and asymptomatic - EGD done by Dr Loreta Ave on 6/22 shows numerous sessile gastric polyps, no fresh or old blood in stomach or duodenum. -No clear source of bleeding. He had capsule endoscopy done by Dr Janna Arch in [place] about 2 years back. report received  which shows a normal study. Discussed again with Dr Loreta Ave [name]  and had a repeat  capsule endoscopy today which will be read tomorrow.  - hold ASA and any NSAIDs  -d/c iron for now  Shortness of breath  Seems to be related to advanced COPD with associated anemia  continue 02 via [place]  duonebs q 6hrs prn  Continue home symbicort  low dose lasix  -currently symptoms improved  Hx of diastolic CHF and CAD  holding ASA, continue statin and BB  Continue low dose lasix   DM  on SSI  Holding metformin  Nutrition consult   DVT prophylaxis: SCD   Diet: diabetic    Full code    Consult: Dr Loreta Ave ( GI)   Dispo: pending capsule endoscopy results ( will be read tomorrow ) and further plan per GI        LOS: 4 days   [name] 04/10/2012, 5:38 PM

## 2012-04-11 ENCOUNTER — Encounter (HOSPITAL_COMMUNITY): Payer: Self-pay | Admitting: Gastroenterology

## 2012-04-11 DIAGNOSIS — J438 Other emphysema: Secondary | ICD-10-CM

## 2012-04-11 DIAGNOSIS — E871 Hypo-osmolality and hyponatremia: Secondary | ICD-10-CM

## 2012-04-11 DIAGNOSIS — I5032 Chronic diastolic (congestive) heart failure: Secondary | ICD-10-CM

## 2012-04-11 DIAGNOSIS — K922 Gastrointestinal hemorrhage, unspecified: Secondary | ICD-10-CM

## 2012-04-11 LAB — GLUCOSE, CAPILLARY
Glucose-Capillary: 150 mg/dL — ABNORMAL HIGH (ref 70–99)
Glucose-Capillary: 171 mg/dL — ABNORMAL HIGH (ref 70–99)
Glucose-Capillary: 141 mg/dL — ABNORMAL HIGH (ref 70–99)
Glucose-Capillary: 171 mg/dL — ABNORMAL HIGH (ref 70–99)
Glucose-Capillary: 218 mg/dL — ABNORMAL HIGH (ref 70–99)

## 2012-04-11 NOTE — Progress Notes (Signed)
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 Sula [name] capsule endocopy done 2011 by Dr. Yehuda [name] in [place]   Consultants:  GI, Dr. Loreta [name]  Procedures:  EGD by Dr. Loreta [name] 6/22=sessille polyps, no blood noted  Antibiotics:  none   Subjective  Has numerous questions regarding where he is bleeding from. The shortness breath, no chest pain, stool has been dark and nonbloody. No vomiting or nausea.   Objective    Interim History: Reviewed all documentation to now  Subjective:   Objective: Filed Vitals:   04/11/12 0414 04/11/12 0900 04/11/12 1320 04/11/12 1524  BP: 126/82  110/67   Pulse: 82  80   Temp: 98.7 F (37.1 C)  98 F (36.7 C)   TempSrc: Oral  Oral   Resp: 16  20   Height:      Weight:    121.609 kg (268 lb 1.6 oz)  SpO2: 98% 88% 92%     Intake/Output Summary (Last 24 hours) at 04/11/12 1540 Last data filed at 04/11/12 1500  Gross per 24 hour  Intake   1560 ml  Output    200 ml  Net   1360 ml    Exam:  General: alert obese CM in NAD Cardiovascular: s1 s2 no m/r/g-non tele Respiratory: clinically clear Abdomen: soft, obese nt/nd Skinno le edema or swelling  Neurocn 2-12 grossly intact  Data Reviewed: Basic Metabolic Panel:  Lab 04/07/12 8119 04/07/12 0620 04/06/12 1610  NA 138 138 139  K 3.8 3.9 --  CL 103 102 104  CO2 27 29 25   GLUCOSE 138* 184* 158*  BUN 9 11 15   CREATININE 0.78 0.85 0.87  CALCIUM 7.7* 7.8* 8.4  MG  2.0 -- --  PHOS -- -- --   Liver Function Tests:  Lab 04/08/12 1158  AST --  ALT --  ALKPHOS --  BILITOT --  PROT --  ALBUMIN 3.4*   No results found for this basename: LIPASE:5,AMYLASE:5 in the last 168 hours No results found for this basename: AMMONIA:5 in the last 168 hours CBC:  Lab 04/09/12 2006 04/08/12 0915 04/08/12 0140 04/07/12 1615 04/07/12 0951 04/07/12 0620 04/06/12 1336  WBC -- -- -- -- -- 7.7 10.1  NEUTROABS -- -- -- -- -- -- 7.9*  HGB 10.0* 9.8* 9.2* 8.5* 7.3* -- --  HCT 33.5* 32.5* 30.0* 28.1* 24.5* -- --  MCV -- -- -- -- -- 85.8 80.3  PLT -- -- -- -- -- 205 238.0   Cardiac Enzymes:  Lab 04/06/12 1610  CKTOTAL 60  CKMB 3.0  CKMBINDEX --  TROPONINI <0.30   BNP: No components found with this basename: POCBNP:5 CBG:  Lab 04/11/12 0728 04/10/12 2052 04/10/12 1709 04/10/12 1146 04/10/12 0752  GLUCAP 141* 171* 150* 139* 152*    No results found for this or any previous visit (from the past 240 hour(s)).   Studies:              All Imaging reviewed and is as per above notation  Scheduled Meds:   . atorvastatin  20 mg Oral q1800  . budesonide-formoterol  2 puff Inhalation BID  . citalopram  10 mg Oral Daily  . furosemide  20 mg Oral Daily  . insulin aspart  0-15 Units Subcutaneous TID WC  . metoprolol tartrate  25 mg Oral BID  . pantoprazole (PROTONIX) IV  40 mg Intravenous Q12H  . sodium chloride  3 mL Intravenous Q12H  . zolpidem  10 mg Oral QHS  . DISCONTD: iron polysaccharides  150 mg Oral BID   Continuous Infusions:    Assessment/Plan:- Acute blood loss Anemia  Patient was taking higher doses of ASA and ? alleve for pain in his neck for past 2 days .  Total 3 units PRBC given since admission  -serial H&h shows improvement in symptoms and asymptomatic--Hemoglobin stable for past 2 -3 days - EGD done by Dr [name] [name] on 6/22 shows numerous sessile gastric polyps, no fresh or old blood in stomach or duodenum. -No clear source of bleeding. He had  capsule endoscopy done by Dr [name] [name] in [place] about 2 years back-normal study. Discussed again with Dr [name] [name] [name] [name] and had a repeat capsule endoscopy today which will be read   - hold ASA and any NSAIDs  -d/c iron for now  Shortness of breath  Seems to be related to advanced COPD with associated anemia  continue 02 via [place]-get desat screen duonebs q 6hrs prn  Continue home symbicort  low dose lasix 20 daily -currently symptoms improved  Hx of diastolic CHF and CAD s/p CABG holding ASA, continue statin and BB, Metoprolol 25 bid Continue low dose lasix  Advised he will need GI clearance prior to ASA resumption DM  on SSI-141 to 171 on currwent SSI Holding metformin  Nutrition consult  H/o 7 cm AAA s/p repair 01/04/2008, coiling 12/2008 Currently stable Depression Continue Xanax 0.25 tid + Citalopram 10 daily (SSRI can also cause bleed) HLD Continue lipitor 20 daily   DVT prophylaxis: SCD    Code Status: Full  Family Communication: Discussed with patient, Wife Disposition Plan: Inpatient-Spoke with Dr. Loreta [name], who will reassess pt.  Assitance in case much appreciated   Howard Koch, MD  [facility] Pager [phone] 04/11/2012, 3:40 PM    LOS: 5 days

## 2012-04-11 NOTE — Progress Notes (Signed)
Subjective: Since I last evaluated the patient, there has been no significant drop in his hemoglobin. His source of GI blood loss still a big question. I was trying to get his capsule study read but it is in the process of being "downloaded" at [facility]. I will probably read it in the morning.   Objective: Vital signs in last 24 hours: Temp:  [98 F (36.7 C)-98.7 F (37.1 C)] 98 F (36.7 C) (06/26 1320) Pulse Rate:  [80-90] 80  (06/26 1320) Resp:  [16-20] 20  (06/26 1320) BP: (110-135)/(67-84) 110/67 mmHg (06/26 1320) SpO2:  [88 %-98 %] 92 % (06/26 1320) Weight:  [121.609 kg (268 lb 1.6 oz)] 121.609 kg (268 lb 1.6 oz) (06/26 1524) Last BM Date: 04/10/12  Intake/Output from previous day: 06/25 0701 - 06/26 0700 In: 840 [P.O.:840] Out: -  Intake/Output this shift: Total I/O In: 1200 [P.O.:1200] Out: 200 [Urine:200]  General appearance: alert, cooperative, appears older than stated age, mild respiratory distress and morbidly obese Resp: diminished breath sounds bilaterally at the bases Cardio: regular rate and rhythm, S1, S2 normal, no murmur, click, rub or gallop GI: soft, non-tender; bowel sounds normal; no masses,  no organomegaly Extremities: extremities with edema but without cyanosis  Lab Results:  Basename 04/09/12 2006  WBC --  HGB 10.0*  HCT 33.5*  PLT --   Medications: I have reviewed the patient's current medications.  Assessment/Plan: 1) Severe anemia: Recent capsule study results pending. Continue serial CBC's. If capsule is negative, he is willing to consider a colonoscopy.  LOS: 5 days   Marcelino Campos 04/11/2012, 6:05 PM

## 2012-04-12 DIAGNOSIS — IMO0001 Reserved for inherently not codable concepts without codable children: Secondary | ICD-10-CM

## 2012-04-12 DIAGNOSIS — I5032 Chronic diastolic (congestive) heart failure: Secondary | ICD-10-CM

## 2012-04-12 DIAGNOSIS — J438 Other emphysema: Secondary | ICD-10-CM

## 2012-04-12 DIAGNOSIS — M6281 Muscle weakness (generalized): Secondary | ICD-10-CM

## 2012-04-12 DIAGNOSIS — K922 Gastrointestinal hemorrhage, unspecified: Secondary | ICD-10-CM

## 2012-04-12 DIAGNOSIS — R52 Pain, unspecified: Secondary | ICD-10-CM

## 2012-04-12 DIAGNOSIS — E871 Hypo-osmolality and hyponatremia: Secondary | ICD-10-CM

## 2012-04-12 DIAGNOSIS — R269 Unspecified abnormalities of gait and mobility: Secondary | ICD-10-CM

## 2012-04-12 LAB — CBC
HCT: 33.8 % — ABNORMAL LOW (ref 39.0–52.0)
Hemoglobin: 9.6 g/dL — ABNORMAL LOW (ref 13.0–17.0)
MCHC: 28.4 g/dL — ABNORMAL LOW (ref 30.0–36.0)
MCV: 87.3 fL (ref 78.0–100.0)
RDW: 17.8 % — ABNORMAL HIGH (ref 11.5–15.5)
WBC: 8.8 10*3/uL (ref 4.0–10.5)

## 2012-04-12 LAB — BASIC METABOLIC PANEL
BUN: 11 mg/dL (ref 6–23)
Chloride: 101 mEq/L (ref 96–112)
Creatinine, Ser: 0.93 mg/dL (ref 0.50–1.35)
Glucose, Bld: 222 mg/dL — ABNORMAL HIGH (ref 70–99)
Potassium: 4.6 mEq/L (ref 3.5–5.1)

## 2012-04-12 LAB — GLUCOSE, CAPILLARY: Glucose-Capillary: 181 mg/dL — ABNORMAL HIGH (ref 70–99)

## 2012-04-12 MED ORDER — PEG 3350-KCL-NA BICARB-NACL 420 G PO SOLR
4000.0000 mL | Freq: Once | ORAL | Status: AC
  Administered 2012-04-12: 4000 mL via ORAL

## 2012-04-12 NOTE — Progress Notes (Signed)
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 Sula Soda capsule endocopy done 2011 by Dr. Yehuda Budd in [place]   Consultants:  GI, Dr. Loreta Ave  Procedures:  EGD by Dr. Loreta Ave 6/22=sessille polyps, no blood noted  Capsule Endoscopy was negative per report 6/27-showed small non-bleding AVM  Antibiotics:  none   Subjective  Has cramping in figners, legs NO shortness breath, no chest pain, stool has been dark and nonbloody. No vomiting or nausea.   Objective    Interim History: Reviewed all documentation to now  Subjective:   Objective: Filed Vitals:   04/11/12 2123 04/12/12 0620 04/12/12 0802 04/12/12 1318  BP: 132/88 138/86  116/73  Pulse: 88 94  79  Temp: 97.5 F (36.4 C) 97.6 F (36.4 C)  97.9 F (36.6 C)  TempSrc: Oral Oral  Oral  Resp: 18 18  16   Height:      Weight:  121.9 kg (268 lb 11.9 oz)    SpO2: 96% 94% 95% 96%    Intake/Output Summary (Last 24 hours) at 04/12/12 1821 Last data filed at 04/12/12 1500  Gross per 24 hour  Intake    960 ml  Output   1500 ml  Net   -540 ml    Exam:  General: alert obese CM in NAD Cardiovascular: s1 s2 no m/r/g-non tele Respiratory: clinically clear Abdomen: soft, obese nt/nd Skinno le edema or swelling  Neurocn 2-12 grossly intact  Data Reviewed: Basic Metabolic Panel:  Lab 04/12/12 8119 04/07/12 1615 04/07/12 0620 04/06/12 1610  NA 139 138 138 139  K 4.6 3.8 -- --  CL 101 103 102 104  CO2  29 27 29 25   GLUCOSE 222* 138* 184* 158*  BUN 11 9 11 15   CREATININE 0.93 0.78 0.85 0.87  CALCIUM 8.8 7.7* 7.8* 8.4  MG -- 2.0 -- --  PHOS -- -- -- --   Liver Function Tests:  Lab 04/08/12 1158  AST --  ALT --  ALKPHOS --  BILITOT --  PROT --  ALBUMIN 3.4*   No results found for this basename: LIPASE:5,AMYLASE:5 in the last 168 hours No results found for this basename: AMMONIA:5 in the last 168 hours CBC:  Lab 04/12/12 0518 04/09/12 2006 04/08/12 0915 04/08/12 0140 04/07/12 1615 04/07/12 0620 04/06/12 1336  WBC 8.8 -- -- -- -- 7.7 10.1  NEUTROABS -- -- -- -- -- -- 7.9*  HGB 9.6* 10.0* 9.8* 9.2* 8.5* -- --  HCT 33.8* 33.5* 32.5* 30.0* 28.1* -- --  MCV 87.3 -- -- -- -- 85.8 80.3  PLT 294 -- -- -- -- 205 238.0   Cardiac Enzymes:  Lab 04/06/12 1610  CKTOTAL 60  CKMB 3.0  CKMBINDEX --  TROPONINI <0.30   BNP: No components found with this basename: POCBNP:5 CBG:  Lab 04/12/12 1655 04/12/12 1153 04/12/12 0806 04/11/12 2044 04/11/12 1659  GLUCAP 177* 193* 181* 218* 171*    No results found for this or any previous visit (from the past 240 hour(s)).   Studies:  All Imaging reviewed and is as per above notation   Scheduled Meds:    . atorvastatin  20 mg Oral q1800  . budesonide-formoterol  2 puff Inhalation BID  . citalopram  10 mg Oral Daily  . furosemide  20 mg Oral Daily  . insulin aspart  0-15 Units Subcutaneous TID WC  . metoprolol tartrate  25 mg Oral BID  . pantoprazole (PROTONIX) IV  40 mg Intravenous Q12H  . polyethylene glycol-electrolytes  4,000 mL Oral Once  . sodium chloride  3 mL Intravenous Q12H  . zolpidem  10 mg Oral QHS   Continuous Infusions:    Assessment/Plan:- Acute blood loss Anemia  Patient was taking higher doses of ASA and ? alleve for pain in his neck for past 2 days .  Total 3 units PRBC given since admission  -serial H&h shows improvement in symptoms and asymptomatic--Hemoglobin stable for past 2 -3 days - EGD  done by Dr Loreta Ave on 6/22 shows numerous sessile gastric polyps, no fresh or old blood in stomach or duodenum. -No clear source of bleeding. He had capsule endoscopy done by Dr Janna Arch in [place] about 2 years back-normal study. Discussed again with Dr Loreta Ave [name] and had a repeat capsule endoscopy today which was neg -for Colonoscopy tomorrow 5:00 pm-patient to be prepped  - hold ASA and any NSAIDs  -d/c iron for now  Shortness of breath  Seems to be related to advanced COPD with associated anemia  continue 02 via [place]-get desat screen duonebs q 6hrs prn  Continue home symbicort  low dose lasix 20 daily -currently symptoms improved  Hx of diastolic CHF and CAD s/p CABG holding ASA, continue statin and BB, Metoprolol 25 bid Continue low dose lasix  Advised he will need GI clearance prior to ASA resumption DM  on SSI-141 to 171 on current SSI Holding metformin  Nutrition consult  H/o 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

## 2012-04-12 NOTE — Progress Notes (Signed)
Subjective: No acute events.  Objective: Vital signs in last 24 hours: Temp:  [97.5 F (36.4 C)-97.9 F (36.6 C)] 97.9 F (36.6 C) (06/27 1318) Pulse Rate:  [79-94] 79  (06/27 1318) Resp:  [16-18] 16  (06/27 1318) BP: (116-138)/(73-88) 116/73 mmHg (06/27 1318) SpO2:  [94 %-96 %] 96 % (06/27 1318) Weight:  [121.609 kg (268 lb 1.6 oz)-121.9 kg (268 lb 11.9 oz)] 121.9 kg (268 lb 11.9 oz) (06/27 0620) Last BM Date: 04/12/12  Intake/Output from previous day: 06/26 0701 - 06/27 0700 In: 1800 [P.O.:1800] Out: 750 [Urine:750] Intake/Output this shift: Total I/O In: 360 [P.O.:360] Out: 375 [Urine:375]  General appearance: alert and no distress GI: soft, non-tender; bowel sounds normal; no masses,  no organomegaly  Lab Results:  Basename 04/12/12 0518 04/09/12 2006  WBC 8.8 --  HGB 9.6* 10.0*  HCT 33.8* 33.5*  PLT 294 --   BMET  Basename 04/12/12 0518  NA 139  K 4.6  CL 101  CO2 29  GLUCOSE 222*  BUN 11  CREATININE 0.93  CALCIUM 8.8   LFT No results found for this basename: PROT,ALBUMIN,AST,ALT,ALKPHOS,BILITOT,BILIDIR,IBILI in the last 72 hours PT/INR No results found for this basename: LABPROT:2,INR:2 in the last 72 hours Hepatitis Panel No results found for this basename: HEPBSAG,HCVAB,HEPAIGM,HEPBIGM in the last 72 hours C-Diff No results found for this basename: CDIFFTOX:3 in the last 72 hours Fecal Lactopherrin No results found for this basename: FECLLACTOFRN in the last 72 hours  Studies/Results: No results found.  Medications:  Scheduled:   . atorvastatin  20 mg Oral q1800  . budesonide-formoterol  2 puff Inhalation BID  . citalopram  10 mg Oral Daily  . furosemide  20 mg Oral Daily  . insulin aspart  0-15 Units Subcutaneous TID WC  . metoprolol tartrate  25 mg Oral BID  . pantoprazole (PROTONIX) IV  40 mg Intravenous Q12H  . sodium chloride  3 mL Intravenous Q12H  . zolpidem  10 mg Oral QHS   Continuous:   Assessment/Plan: 1) GI  bleed.   The capsule endoscopy was negative for any evidence of active bleeding or old blood.  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  I discussed the situation with the patient.  In this instance I think a repeat colonoscopy is in order to make sure that there is no lower GI source of bleeding.  I will attempt to locate the AVM in the duodenum and ablate the lesion.  Plan: 1) EGD/Colonoscopy tomorrow.  LOS: 6 days   [name] D 04/12/2012, 3:11 PM  

## 2012-04-12 NOTE — Progress Notes (Signed)
Inpatient Diabetes Program Recommendations  AACE/ADA: New Consensus Statement on Inpatient Glycemic Control (2009)  Target Ranges:  Prepandial:   less than 140 mg/dL      Peak postprandial:   less than 180 mg/dL (1-2 hours)      Critically ill patients:  140 - 180 mg/dL   Reason for Visit: Results for [name], [name] (MRN [id number]) as of 04/12/2012 10:55  Ref. Range 04/11/2012 07:28 04/11/2012 11:59 04/11/2012 16:59 04/11/2012 20:44 04/12/2012 08:06  Glucose-Capillary Latest Range: 70-99 mg/dL 409 (H) 811 (H) 914 (H) 218 (H) 181 (H)   Results for [name], [name] (MRN [id number]) as of 04/12/2012 10:55  Ref. Range 03/27/2012 17:35  Hemoglobin A1C Latest Range: <5.7 % 6.7 (H)   Note A1C, however with recent anemia unsure of accuracy.  CBG's trending up slightly.  Consider adding Lantus 10 units daily while in the hospital.  Will follow.

## 2012-04-13 ENCOUNTER — Encounter (HOSPITAL_COMMUNITY)
Admission: EM | Disposition: A | Discharge status: Home / Self Care | Payer: Self-pay | Source: Home / Self Care | Attending: Internal Medicine

## 2012-04-13 ENCOUNTER — Encounter (HOSPITAL_COMMUNITY): Payer: Self-pay | Admitting: *Deleted

## 2012-04-13 DIAGNOSIS — I5032 Chronic diastolic (congestive) heart failure: Secondary | ICD-10-CM

## 2012-04-13 DIAGNOSIS — J438 Other emphysema: Secondary | ICD-10-CM

## 2012-04-13 DIAGNOSIS — K922 Gastrointestinal hemorrhage, unspecified: Secondary | ICD-10-CM

## 2012-04-13 DIAGNOSIS — E871 Hypo-osmolality and hyponatremia: Secondary | ICD-10-CM

## 2012-04-13 HISTORY — PX: COLONOSCOPY: SHX5424

## 2012-04-13 HISTORY — PX: ESOPHAGOGASTRODUODENOSCOPY: SHX5428

## 2012-04-13 LAB — GLUCOSE, CAPILLARY
Glucose-Capillary: 145 mg/dL — ABNORMAL HIGH (ref 70–99)
Glucose-Capillary: 139 mg/dL — ABNORMAL HIGH (ref 70–99)
Glucose-Capillary: 126 mg/dL — ABNORMAL HIGH (ref 70–99)

## 2012-04-13 SURGERY — COLONOSCOPY
Anesthesia: Moderate Sedation

## 2012-04-13 MED ORDER — MIDAZOLAM HCL 10 MG/2ML IJ SOLN
INTRAMUSCULAR | Status: DC | PRN
  Administered 2012-04-13: 2 mg via INTRAVENOUS
  Administered 2012-04-13 (×2): 1 mg via INTRAVENOUS
  Administered 2012-04-13 (×2): 2 mg via INTRAVENOUS

## 2012-04-13 MED ORDER — DIPHENHYDRAMINE HCL 50 MG/ML IJ SOLN
INTRAMUSCULAR | Status: DC | PRN
  Administered 2012-04-13: 25 mg via INTRAVENOUS

## 2012-04-13 MED ORDER — LORAZEPAM 2 MG/ML IJ SOLN
INTRAMUSCULAR | Status: AC
  Administered 2012-04-13: 0.5 mg via INTRAVENOUS
  Filled 2012-04-13: qty 1

## 2012-04-13 MED ORDER — MIDAZOLAM HCL 10 MG/2ML IJ SOLN
INTRAMUSCULAR | Status: AC
  Filled 2012-04-13: qty 4

## 2012-04-13 MED ORDER — FENTANYL CITRATE 0.05 MG/ML IJ SOLN
INTRAMUSCULAR | Status: AC
  Filled 2012-04-13: qty 4

## 2012-04-13 MED ORDER — DIPHENHYDRAMINE HCL 50 MG/ML IJ SOLN
INTRAMUSCULAR | Status: AC
  Filled 2012-04-13: qty 1

## 2012-04-13 MED ORDER — SODIUM CHLORIDE 0.9 % IV SOLN: INTRAVENOUS | Status: DC

## 2012-04-13 MED ORDER — LORAZEPAM 2 MG/ML IJ SOLN
0.5000 mg | Freq: Once | INTRAMUSCULAR | Status: AC
  Administered 2012-04-13: 0.5 mg via INTRAVENOUS

## 2012-04-13 MED ORDER — FENTANYL CITRATE 0.05 MG/ML IJ SOLN
INTRAMUSCULAR | Status: DC | PRN
  Administered 2012-04-13 (×3): 25 ug via INTRAVENOUS
  Administered 2012-04-13: 12.5 ug via INTRAVENOUS

## 2012-04-13 NOTE — H&P (View-Only) (Signed)
Subjective: No acute events.  Objective: Vital signs in last 24 hours: Temp:  [97.5 F (36.4 C)-97.9 F (36.6 C)] 97.9 F (36.6 C) (06/27 1318) Pulse Rate:  [79-94] 79  (06/27 1318) Resp:  [16-18] 16  (06/27 1318) BP: (116-138)/(73-88) 116/73 mmHg (06/27 1318) SpO2:  [94 %-96 %] 96 % (06/27 1318) Weight:  [121.609 kg (268 lb 1.6 oz)-121.9 kg (268 lb 11.9 oz)] 121.9 kg (268 lb 11.9 oz) (06/27 0620) Last BM Date: 04/12/12  Intake/Output from previous day: 06/26 0701 - 06/27 0700 In: 1800 [P.O.:1800] Out: 750 [Urine:750] Intake/Output this shift: Total I/O In: 360 [P.O.:360] Out: 375 [Urine:375]  General appearance: alert and no distress GI: soft, non-tender; bowel sounds normal; no masses,  no organomegaly  Lab Results:  Basename 04/12/12 0518 04/09/12 2006  WBC 8.8 --  HGB 9.6* 10.0*  HCT 33.8* 33.5*  PLT 294 --   BMET  Basename 04/12/12 0518  NA 139  K 4.6  CL 101  CO2 29  GLUCOSE 222*  BUN 11  CREATININE 0.93  CALCIUM 8.8   LFT No results found for this basename: PROT,ALBUMIN,AST,ALT,ALKPHOS,BILITOT,BILIDIR,IBILI in the last 72 hours PT/INR No results found for this basename: LABPROT:2,INR:2 in the last 72 hours Hepatitis Panel No results found for this basename: HEPBSAG,HCVAB,HEPAIGM,HEPBIGM in the last 72 hours C-Diff No results found for this basename: CDIFFTOX:3 in the last 72 hours Fecal Lactopherrin No results found for this basename: FECLLACTOFRN in the last 72 hours  Studies/Results: No results found.  Medications:  Scheduled:   . atorvastatin  20 mg Oral q1800  . budesonide-formoterol  2 puff Inhalation BID  . citalopram  10 mg Oral Daily  . furosemide  20 mg Oral Daily  . insulin aspart  0-15 Units Subcutaneous TID WC  . metoprolol tartrate  25 mg Oral BID  . pantoprazole (PROTONIX) IV  40 mg Intravenous Q12H  . sodium chloride  3 mL Intravenous Q12H  . zolpidem  10 mg Oral QHS   Continuous:   Assessment/Plan: 1) GI  bleed.   The capsule endoscopy was negative for any evidence of active bleeding or old blood.  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  I discussed the situation with the patient.  In this instance I think a repeat colonoscopy is in order to make sure that there is no lower GI source of bleeding.  I will attempt to locate the AVM in the duodenum and ablate the lesion.  Plan: 1) EGD/Colonoscopy tomorrow.  LOS: 6 days   Danene Montijo D 04/12/2012, 3:11 PM

## 2012-04-13 NOTE — Op Note (Signed)
[facility] [address] [place], [place]  [address]  OPERATIVE PROCEDURE REPORT  PATIENT:  [name], [name]  MR#:  [id number] BIRTHDATE:  01/28/1941  GENDER:  male ENDOSCOPIST:  Jeani Hawking, MD ASSISTANT:  Angelique Blonder and Kizzie Bane PROCEDURE DATE:  04/13/2012 PROCEDURE:  Colonoscopy [phone] ASA CLASS:  Class III INDICATIONS:  Heme positive stool and anemia MEDICATIONS:  Fentanyl 37.5 mcg IV, Versed 5 mg IV  DESCRIPTION OF PROCEDURE:   After the risks benefits and alternatives of the procedure were thoroughly explained, informed consent was obtained.  Digital rectal exam was performed and revealed no abnormalities.   The Pentax Colonoscope [id number] endoscope was introduced through the anus and advanced to the cecum, which was identified by both the appendix and ileocecal valve, without limitations.  The quality of the prep was adequate..  The instrument was then slowly withdrawn as the colon was fully examined. <<PROCEDUREIMAGES>>  FINDINGS:  Overall the colonic prep was good, but several portions of the mucosa were obscured with vegetable matter. Some small lesions could be missed, however, there was no overt evidence of bleeding or old blood. No masses, inflammation, ulcerations, polyps, or vascular abnormalities in the examined colon. Scattered diverticula were identified. I feel that the patient have have had some additional bleeding from his gastric polyps or he was still in the process of equilibrating from the blood transfusions during the initial hospitalization.   I do not believe the bleeding soure was from the colon.  Retroflexed views in the rectum revealed internal and external hemorrhoids.    The scope was then withdrawn from the patient and the procedure terminated.  COMPLICATIONS:  None  IMPRESSION:  1) Diverticula 2) Internal and external hemorrhoids RECOMMENDATIONS:  1) Follow HGB. 2) Transfuse as  necessary.  ______________________________ Jeani Hawking, MD  n. Rosalie DoctorJeani Hawking at 04/13/2012 03:41 PM  Christ Kick, [phone]

## 2012-04-13 NOTE — Interval H&P Note (Signed)
History and Physical Interval Note:  04/13/2012 2:39 PM  [name]  has presented today for surgery, with the diagnosis of GI bleed  The various methods of treatment have been discussed with the patient and family. After consideration of risks, benefits and other options for treatment, the patient has consented to  Procedure(s) (LRB): COLONOSCOPY (N/A) ESOPHAGOGASTRODUODENOSCOPY (EGD) (N/A) as a surgical intervention .  The patient's history has been reviewed, patient examined, no change in status, stable for surgery.  I have reviewed the patients' chart and labs.  Questions were answered to the patient's satisfaction.     Tyson Parkison D

## 2012-04-13 NOTE — Op Note (Signed)
[facility] [address] [place], [place]  [address]  OPERATIVE PROCEDURE REPORT  PATIENT:  Howard, [name]  MR#:  [id number] BIRTHDATE:  Mar 13, 1941  GENDER:  male ENDOSCOPIST:  Jeani Hawking, MD ASSISTANT:  Kizzie Bane and Angelique Blonder PROCEDURE DATE:  04/13/2012 PROCEDURE:  EGD with lesion ablation ASA CLASS:  Class III INDICATIONS:  Anemia and GI bleed MEDICATIONS:  Fentanyl 50 mcg IV, Versed 4 mg IV, Benadryl 25 mg IV  DESCRIPTION OF PROCEDURE:   After the risks benefits and alternatives of the procedure were thoroughly explained, informed consent was obtained.  The Pentax Gastroscope [id number] endoscope was introduced through the mouth and advanced to the second portion of the duodenum, without limitations.  The instrument was slowly withdrawn as the mucosa was fully examined. <<PROCEDUREIMAGES>>  FINDINGS:  In the proximal gastric lumen there was evidence of the polyps with superficial ulcerations. No evidence of any active bleeding at this time. In the duodenum there was the possibility of an AVM again, but it may have been from scope trauma. Regardless, the site was treated with APC. The prior hemoclips were intact. No other abnormalities identified.    Retroflexed views revealed no abnormalities.    The scope was then withdrawn from the patient and the procedure terminated.  COMPLICATIONS:  None  IMPRESSION:  1) Duodenal AVM. 2) Gastric polyps. RECOMMENDATIONS:  1) Proceed with the colonoscopy.  ______________________________ Jeani Hawking, MD  n. Rosalie DoctorJeani Hawking at 04/13/2012 03:34 PM  Christ Kick, [phone]

## 2012-04-13 NOTE — Care Management Note (Unsigned)
    Page 1 of 1   04/13/2012     4:50:49 PM   CARE MANAGEMENT NOTE 04/13/2012  Patient:  [name], [name]   Account Number:  [id number]  Date Initiated:  04/13/2012  Documentation initiated by:  Lanier Clam  Subjective/Objective Assessment:   ADMITTED W/GIB     Action/Plan:   FROM HOME ACTIVE W/[facility]PT.   Anticipated DC Date:  04/17/2012   Anticipated DC Plan:  HOME W HOME HEALTH SERVICES      DC Planning Services  CM consult      [facility] Choice  Resumption Of Svcs/PTA Provider   Choice offered to / List presented to:             Status of service:  In process, will continue to follow Medicare Important Message given?   (If response is "NO", the following Medicare IM given date fields will be blank) Date Medicare IM given:   Date Additional Medicare IM given:    Discharge Disposition:    Per UR Regulation:  Reviewed for med. necessity/level of care/duration of stay  If discussed at Long Length of Stay Meetings, dates discussed:    Comments:  04/13/12 Renso Swett RN,BSN NCM [phone] [facility] AWARE OF HH,& FOLLOWING.

## 2012-04-14 DIAGNOSIS — J438 Other emphysema: Secondary | ICD-10-CM

## 2012-04-14 DIAGNOSIS — E871 Hypo-osmolality and hyponatremia: Secondary | ICD-10-CM

## 2012-04-14 DIAGNOSIS — K922 Gastrointestinal hemorrhage, unspecified: Secondary | ICD-10-CM

## 2012-04-14 DIAGNOSIS — I5032 Chronic diastolic (congestive) heart failure: Secondary | ICD-10-CM

## 2012-04-14 NOTE — Discharge Summary (Signed)
[facility] Hospital Discharge Summary  Date of Admission: 04/06/2012  3:24 PM Admitter: @ADMITPROV @   Date of Discharge6/29/2013 Attending Physician: Rhetta Mura, MD  Things to Follow-up on: Patient needs CBC in 1 week Needs discussion about ASA use in 2-3 weeks Please arrange cardiology f/u prn-Dr. 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 Dulce Sellar Recently admitted 6/10 with EGD showing nonbleeding duodenal AVM, fundic polyp, 1 bleeding polyp and others apical erosions he received 2 units packed red blood cells and hemoglobin improved to 9 and was discharged home on PPI. Continue to take aspirin and Aleve and was found to be fecal occult blood positive. 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 Sula Soda capsule endocopy done 2011 by Dr. Yehuda Budd in [place]   Consultants:  GI, Dr. Loreta Ave  Procedures:  EGD by Dr. Loreta Ave 6/22=sessille polyps, no blood noted   Capsule Endoscopy was negative per report 6/27-showed small  non-bleding AVM  Colonoscopy 6/28 = equivocal with some diverticula and vaginal matter, internal and external hemorrhoids  Upper endoscopy 6/28 = duodenal AVM, gastric polyps-site of AVMs treated with APC  Antibiotics:  none   Hospital Course by problem list:  LOS: 8 days   Acute blood loss Anemia   Patient was taking higher doses of ASA and ? alleve for pain in his neck for past 2 days .   Total 3 units PRBC given since admission   -serial H&h shows improvement in symptoms and asymptomatic--Hemoglobin stable for past 2 -3 days - EGD done by Dr Loreta Ave on 6/22 shows numerous sessile gastric polyps, no fresh or old blood in stomach or duodenum.  -No clear source of bleeding. He had capsule endoscopy done by Dr Janna Arch in [place] about 2 years back-normal study. Discussed again with Dr Loreta Ave [name] and had a repeat capsule endoscopy 6/27 which was neg -Colonoscopy endoscopy done as per above note.   - hold ASA and any NSAIDs-needs outpatient followup for this, cardiology and gastroenterology to determine when safe to resume aspirin -d/c iron for now   Shortness of breath   Seems to be related to advanced COPD with associated anemia   continue 02 via [place]-usually uses oxygen 2 L at rest duonebs q 6hrs prn   Continue home symbicort   low dose lasix 20 daily -currently symptoms improved -Outpatient followup with pulmonary , Dr Kendrick Fries Hx of diastolic CHF and CAD s/p CABG holding ASA, continue statin and BB, Metoprolol 25 bid Continue low dose lasix   Advised he will need GI clearance prior to ASA resumption  DM  on SSI-141 to 171 on current SSI Holding metformin-resumed on discharge H/o 7 cm AAA s/p repair 01/04/2008, coiling 12/2008 Currently stable Depression Continue Xanax 0.25 tid + Citalopram 10 daily (SSRI can also cause bleed) Patient did make some untoward remarks during hospitalization and was rude and aggressive to staff. HLD Continue lipitor 20 daily   Procedures  Performed and pertinent labs: Dg Chest 2 View  03/27/2012  *RADIOLOGY REPORT*  Clinical Data: Shortness of breath  CHEST - 2 VIEW  Comparison: 01/27/2010  Findings: Due to patient body habitus, suboptimal x-ray exposure which limits evaluation for subtle abnormality.  Cardiomegaly. Central vascular congestion.  Status post median sternotomy and CABG.  Bullous changes of the left lung apex. Bronchitic changes. Bibasilar opacities.  Large hiatal hernia.  Osteopenia.  Remote posterior right rib fracture.  IMPRESSION: Cardiomegaly with central vascular congestion.  COPD/bronchitic changes.  Bibasilar opacities; atelectasis versus infiltrate.  Original Report Authenticated By: Waneta Martins, M.D.    Discharge Vitals & PE:  BP 124/78  Pulse 85  Temp 97 F (36.1 C) (Axillary)  Resp 20  Ht 6\' 3"  (1.905 m)  Wt 121.882 kg (268 lb 11.2 oz)  BMI 33.59 kg/m2  SpO2 96%General: alert obese CM in NAD Cardiovascular: s1 s2 no m/r/g-non tele Respiratory: clinically clear Abdomen: soft, obese nt/nd Skinno le edema or swelling   Neurocn 2-12 grossly intact   Discharge Labs:  Results for orders placed during the hospital encounter of 04/06/12 (from the past 24 hour(s))  GLUCOSE, CAPILLARY     Status: Abnormal   Collection Time   04/13/12 12:19 PM      Component Value Range   Glucose-Capillary 145 (*) 70 - 99 mg/dL  GLUCOSE, CAPILLARY     Status: Abnormal   Collection Time   04/13/12  2:18 PM      Component Value Range   Glucose-Capillary 139 (*) 70 - 99 mg/dL  GLUCOSE, CAPILLARY     Status: Abnormal   Collection Time   04/13/12  5:10 PM      Component Value Range   Glucose-Capillary 126 (*) 70 - 99 mg/dL  GLUCOSE, CAPILLARY     Status: Abnormal   Collection Time   04/13/12  9:06 PM      Component Value Range   Glucose-Capillary 195 (*) 70 - 99 mg/dL   Comment 1 Documented in Chart     Comment 2 Notify RN    GLUCOSE, CAPILLARY     Status: Abnormal   Collection Time   04/14/12  7:30 AM       Component Value Range   Glucose-Capillary 179 (*) 70 - 99 mg/dL    Disposition and follow-up:   Mr.Maliki L Dickmann was discharged from in good condition.    Follow-up Appointments:  Follow-up Information    Follow up with [name], MD in 5 days.   Contact information:   [address] [name], [place] [address] [phone]       Follow up with Theda Belfast, MD. Schedule an appointment as soon as possible for a visit in 3 weeks.   Contact information:   [address] [place] [address] [phone]       Follow up with Governor Rooks, MD in 1 month.   Contact information:   [address]  [place] [address] [phone]       Follow up with Max Fickle, MD  in 3 weeks.   Contact information:   [address] [place] [address] [place] [address] [phone]          Discharge Medications: Medication List  As of 04/14/2012  9:43 AM   STOP taking these medications         aspirin 81 MG tablet         TAKE these medications         ALPRAZolam 0.25 MG tablet   Commonly known as: XANAX   Take 0.25 mg by mouth 3 (three) times daily as needed. anxiety      budesonide-formoterol 160-4.5 MCG/ACT inhaler   Commonly known as: SYMBICORT   Inhale 2 puffs into the lungs 2 (two) times daily.      citalopram 10 MG tablet   Commonly known as: CELEXA   Take 10 mg by mouth daily.      folic acid 1 MG tablet   Commonly known as: FOLVITE   Take 1 mg by mouth daily.      HYDROcodone-acetaminophen 5-500 MG per tablet   Commonly known as: VICODIN   Take 1 tablet by mouth every 4 (four) hours as needed. pain      metFORMIN 500 MG tablet   Commonly known as: GLUCOPHAGE   Take 500 mg by mouth 2 (two) times daily with a meal.      metoprolol tartrate 25 MG tablet   Commonly known as: LOPRESSOR   Take 25 mg by mouth 2 (two) times daily.       pantoprazole 40 MG tablet   Commonly known as: PROTONIX   Take 40 mg by mouth 2 (two) times daily.      polysaccharide iron 150 MG capsule   Generic drug: iron polysaccharides   Take 150 mg by mouth 2 (two) times daily.      rosuvastatin 40 MG tablet   Commonly known as: CRESTOR   Take 40 mg by mouth daily.      zolpidem 10 MG tablet   Commonly known as: AMBIEN   Take 10 mg by mouth at bedtime.           Medications Discontinued During This Encounter  Medication Reason  . albuterol (PROVENTIL HFA;VENTOLIN HFA) 108 (90 BASE) MCG/ACT inhaler Entry Error  . metFORMIN (GLUCOPHAGE) 500 MG tablet Entry Error  . ALPRAZolam (XANAX) 0.25 MG tablet Entry Error  . metoprolol succinate (TOPROL-XL) 25 MG 24 hr tablet Change in therapy  . budesonide-formoterol (SYMBICORT) 160-4.5 MCG/ACT inhaler Entry Error  . pantoprazole (PROTONIX) 40 MG tablet Entry Error  . citalopram (CELEXA) 10 MG tablet Entry Error  . rosuvastatin (CRESTOR) 40 MG tablet Entry Error  . HYDROcodone-acetaminophen (VICODIN) 5-500 MG per tablet   . zolpidem (AMBIEN) 10 MG tablet   . midazolam (VERSED) injection Patient Discharge  . fentaNYL NICU IV Syringe 50 mcg/mL Patient Discharge  . lidocaine (XYLOCAINE) 2 % viscous mouth solution Patient Discharge  . 0.9 %  sodium chloride infusion   . 0.9 %  sodium chloride infusion   . folic acid (FOLVITE) tablet 1 mg   . pantoprazole (PROTONIX) 80 mg in sodium chloride 0.9 % 250 mL infusion   . ipratropium (ATROVENT) nebulizer solution 0.5 mg   . albuterol (PROVENTIL) (5 MG/ML) 0.5% nebulizer solution 2.5 mg   . albuterol (PROVENTIL) (5 MG/ML) 0.5% nebulizer solution 2.5 mg   . ipratropium (ATROVENT) nebulizer solution 0.5 mg   . iron polysaccharides (NIFEREX) capsule 150 mg   . midazolam (  VERSED) injection Patient Discharge  . fentaNYL (SUBLIMAZE) injection Patient Discharge  . diphenhydrAMINE (BENADRYL) injection Patient Discharge  . 0.9 %  sodium chloride infusion    . aspirin 81 MG tablet Stop Taking at Discharge    > 40 minutes time spent preparing d/c summary, including direct face-face patient Time, contact with consultants, family and care coordination   Signed: Rhetta Mura 04/14/2012, 9:43 AM

## 2012-04-14 NOTE — Progress Notes (Signed)
Subjective: No acute events.  Patient feels well.  Objective: Vital signs in last 24 hours: Temp:  [97 F (36.1 C)-98.9 F (37.2 C)] 97 F (36.1 C) (06/29 0602) Pulse Rate:  [79-98] 85  (06/29 0602) Resp:  [16-24] 20  (06/29 0602) BP: (113-149)/(61-89) 124/78 mmHg (06/29 0602) SpO2:  [94 %-100 %] 96 % (06/29 0602) Weight:  [121.882 kg (268 lb 11.2 oz)] 121.882 kg (268 lb 11.2 oz) (06/29 0602) Last BM Date: 04/13/12  Intake/Output from previous day: 06/28 0701 - 06/29 0700 In: 720 [P.O.:720] Out: 200 [Urine:200] Intake/Output this shift:    General appearance: alert and no distress GI: soft, non-tender; bowel sounds normal; no masses,  no organomegaly  Lab Results:  [facility] 04/12/12 0518  WBC 8.8  HGB 9.6*  HCT 33.8*  PLT 294   BMET  Basename 04/12/12 0518  NA 139  K 4.6  CL 101  CO2 29  GLUCOSE 222*  BUN 11  CREATININE 0.93  CALCIUM 8.8   LFT No results found for this basename: PROT,ALBUMIN,AST,ALT,ALKPHOS,BILITOT,BILIDIR,IBILI in the last 72 hours PT/INR No results found for this basename: LABPROT:2,INR:2 in the last 72 hours Hepatitis Panel No results found for this basename: HEPBSAG,HCVAB,HEPAIGM,HEPBIGM in the last 72 hours C-Diff No results found for this basename: CDIFFTOX:3 in the last 72 hours Fecal Lactopherrin No results found for this basename: FECLLACTOFRN in the last 72 hours  Studies/Results: No results found.  Medications:  Scheduled:   . atorvastatin  20 mg Oral q1800  . budesonide-formoterol  2 puff Inhalation BID  . citalopram  10 mg Oral Daily  . furosemide  20 mg Oral Daily  . insulin aspart  0-15 Units Subcutaneous TID WC  . metoprolol tartrate  25 mg Oral BID  . pantoprazole (PROTONIX) IV  40 mg Intravenous Q12H  . sodium chloride  3 mL Intravenous Q12H  . zolpidem  10 mg Oral QHS   Continuous:   . DISCONTD: sodium chloride      Assessment/Plan: 1) GI bleed - most likely upper source. 2) Multiple medical  problems.   The patient's HGB has been stable since his admission transfusion.  I feel that his bleeding source was from the upper GI tract despite the colonic prep.  It may have been that he was still equilibrating when he was readmitted for anemia or he bleed more from the gastric polyps.  One of the gastric polyps during the initial EGD was noted to be actively bleeding and it was cauterized.  The polyps during this EGD still displayed apical erosions, but they appeared to be healing.  Plan: 1) Follow up with PCP for CBC check. 2) PPI indefinitely. 3) Okay to D/C Home. 4) Signing off.  LOS: 8 days   Davionte Lusby D 04/14/2012, 9:12 AM

## 2012-04-14 NOTE — Discharge Instructions (Signed)
Gastrointestinal Bleeding  Bleeding in the gastrointestinal tract comes out when you throw up (vomit) or poop. Treatment will depend on how fast the blood is flowing, where it is coming from, and the cause. A small amount of bleeding that stops on its own may not need treatment.   HOME CARE   Do not drink alcohol.   Do not eat things that upset your stomach or give you heartburn.   Rest and limit your activity.   Do not smoke. Smoking may make your problems worse.   Wash your hands or use sanitizer every time you use the bathroom. Some bleeding is caused by germs.   Only take medicine as told by your doctor.  GET HELP RIGHT AWAY IF:    Your throw up looks like coffee grounds or is dark or bright red.   Your poop is black or tarry. You see blood in the toilet.   You feel weak, dizzy, and short of breath.   You breathe fast and have a fast heartbeat.   You have bad stomach pain or cramping.  MAKE SURE YOU:    Understand these instructions.   Will watch your condition.   Will get help right away if you are not doing well or get worse.  Document Released: 07/12/2008 Document Revised: 09/22/2011 Document Reviewed: 09/12/2011  Exit[employer] Patient Information 2012 [employer].

## 2012-04-17 ENCOUNTER — Encounter: Payer: Self-pay | Admitting: Family Medicine

## 2012-04-17 ENCOUNTER — Ambulatory Visit (INDEPENDENT_AMBULATORY_CARE_PROVIDER_SITE_OTHER): Payer: Medicare Other | Admitting: Family Medicine

## 2012-04-17 VITALS — Ht 75.0 in

## 2012-04-17 DIAGNOSIS — I2510 Atherosclerotic heart disease of native coronary artery without angina pectoris: Secondary | ICD-10-CM | POA: Insufficient documentation

## 2012-04-17 DIAGNOSIS — K922 Gastrointestinal hemorrhage, unspecified: Secondary | ICD-10-CM

## 2012-04-17 NOTE — Progress Notes (Signed)
No show
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
## 2012-04-18 ENCOUNTER — Ambulatory Visit (INDEPENDENT_AMBULATORY_CARE_PROVIDER_SITE_OTHER): Payer: Medicare Other | Admitting: Family Medicine

## 2012-04-18 ENCOUNTER — Encounter: Payer: Self-pay | Admitting: Family Medicine

## 2012-04-18 VITALS — BP 102/62 | HR 64 | Temp 97.9°F | Wt 269.0 lb

## 2012-04-18 DIAGNOSIS — J449 Chronic obstructive pulmonary disease, unspecified: Secondary | ICD-10-CM

## 2012-04-18 DIAGNOSIS — R0600 Dyspnea, unspecified: Secondary | ICD-10-CM

## 2012-04-18 DIAGNOSIS — K922 Gastrointestinal hemorrhage, unspecified: Secondary | ICD-10-CM

## 2012-04-18 DIAGNOSIS — R0609 Other forms of dyspnea: Secondary | ICD-10-CM

## 2012-04-18 LAB — CBC WITH DIFFERENTIAL/PLATELET
Basophils Relative: 0.2 % (ref 0.0–3.0)
Eosinophils Absolute: 0.1 10*3/uL (ref 0.0–0.7)
Hemoglobin: 9.6 g/dL — ABNORMAL LOW (ref 13.0–17.0)
MCHC: 31 g/dL (ref 30.0–36.0)
MCV: 80.4 fl (ref 78.0–100.0)
Monocytes Absolute: 0.8 10*3/uL (ref 0.1–1.0)
Neutro Abs: 6.5 10*3/uL (ref 1.4–7.7)
Neutrophils Relative %: 76.9 % (ref 43.0–77.0)
RBC: 3.84 Mil/uL — ABNORMAL LOW (ref 4.22–5.81)

## 2012-04-18 NOTE — Patient Instructions (Addendum)
We will call you with your lab results. Please make an appointment with Dr. Alanda Amass in the next 1 -2 weeks. We are continuing your aspirin 81 mg daily.

## 2012-04-23 ENCOUNTER — Other Ambulatory Visit: Payer: Self-pay | Admitting: *Deleted

## 2012-04-23 MED ORDER — ZOLPIDEM TARTRATE 10 MG PO TABS: 10.0000 mg | ORAL_TABLET | Freq: Every day | ORAL | Status: DC

## 2012-04-23 NOTE — Telephone Encounter (Signed)
Medicine called to pharmacy. 

## 2012-04-23 NOTE — Telephone Encounter (Signed)
Faxed refill request from [facility]  [address] 

## 2012-05-16 ENCOUNTER — Encounter: Payer: Self-pay | Admitting: Neurosurgery

## 2012-05-17 ENCOUNTER — Emergency Department (HOSPITAL_COMMUNITY): Payer: Medicare Other

## 2012-05-17 ENCOUNTER — Inpatient Hospital Stay (HOSPITAL_COMMUNITY): Payer: Medicare Other

## 2012-05-17 ENCOUNTER — Inpatient Hospital Stay (HOSPITAL_COMMUNITY)
Admission: EM | Admit: 2012-05-17 | Discharge: 2012-05-21 | DRG: 377 | Disposition: A | Discharge status: Home / Self Care | Payer: Medicare Other | Attending: Internal Medicine | Admitting: Internal Medicine

## 2012-05-17 ENCOUNTER — Encounter (HOSPITAL_COMMUNITY): Payer: Self-pay | Admitting: *Deleted

## 2012-05-17 ENCOUNTER — Ambulatory Visit: Payer: Medicare Other | Admitting: Neurosurgery

## 2012-05-17 DIAGNOSIS — D500 Iron deficiency anemia secondary to blood loss (chronic): Secondary | ICD-10-CM | POA: Diagnosis present

## 2012-05-17 DIAGNOSIS — Z87891 Personal history of nicotine dependence: Secondary | ICD-10-CM

## 2012-05-17 DIAGNOSIS — R0600 Dyspnea, unspecified: Secondary | ICD-10-CM

## 2012-05-17 DIAGNOSIS — I509 Heart failure, unspecified: Secondary | ICD-10-CM | POA: Diagnosis present

## 2012-05-17 DIAGNOSIS — I5033 Acute on chronic diastolic (congestive) heart failure: Secondary | ICD-10-CM | POA: Diagnosis present

## 2012-05-17 DIAGNOSIS — Z79899 Other long term (current) drug therapy: Secondary | ICD-10-CM

## 2012-05-17 DIAGNOSIS — H6120 Impacted cerumen, unspecified ear: Secondary | ICD-10-CM

## 2012-05-17 DIAGNOSIS — I714 Abdominal aortic aneurysm, without rupture, unspecified: Secondary | ICD-10-CM

## 2012-05-17 DIAGNOSIS — I5030 Unspecified diastolic (congestive) heart failure: Secondary | ICD-10-CM

## 2012-05-17 DIAGNOSIS — K5521 Angiodysplasia of colon with hemorrhage: Principal | ICD-10-CM | POA: Diagnosis present

## 2012-05-17 DIAGNOSIS — E785 Hyperlipidemia, unspecified: Secondary | ICD-10-CM

## 2012-05-17 DIAGNOSIS — D72829 Elevated white blood cell count, unspecified: Secondary | ICD-10-CM

## 2012-05-17 DIAGNOSIS — Z951 Presence of aortocoronary bypass graft: Secondary | ICD-10-CM

## 2012-05-17 DIAGNOSIS — K922 Gastrointestinal hemorrhage, unspecified: Secondary | ICD-10-CM | POA: Diagnosis present

## 2012-05-17 DIAGNOSIS — J4489 Other specified chronic obstructive pulmonary disease: Secondary | ICD-10-CM | POA: Diagnosis present

## 2012-05-17 DIAGNOSIS — I499 Cardiac arrhythmia, unspecified: Secondary | ICD-10-CM

## 2012-05-17 DIAGNOSIS — I5032 Chronic diastolic (congestive) heart failure: Secondary | ICD-10-CM | POA: Diagnosis present

## 2012-05-17 DIAGNOSIS — Z7982 Long term (current) use of aspirin: Secondary | ICD-10-CM

## 2012-05-17 DIAGNOSIS — I219 Acute myocardial infarction, unspecified: Secondary | ICD-10-CM

## 2012-05-17 DIAGNOSIS — I252 Old myocardial infarction: Secondary | ICD-10-CM

## 2012-05-17 DIAGNOSIS — J438 Other emphysema: Secondary | ICD-10-CM

## 2012-05-17 DIAGNOSIS — D131 Benign neoplasm of stomach: Secondary | ICD-10-CM | POA: Diagnosis present

## 2012-05-17 DIAGNOSIS — I10 Essential (primary) hypertension: Secondary | ICD-10-CM

## 2012-05-17 DIAGNOSIS — E119 Type 2 diabetes mellitus without complications: Secondary | ICD-10-CM

## 2012-05-17 DIAGNOSIS — D649 Anemia, unspecified: Secondary | ICD-10-CM

## 2012-05-17 DIAGNOSIS — J449 Chronic obstructive pulmonary disease, unspecified: Secondary | ICD-10-CM

## 2012-05-17 DIAGNOSIS — I2510 Atherosclerotic heart disease of native coronary artery without angina pectoris: Secondary | ICD-10-CM

## 2012-05-17 DIAGNOSIS — K921 Melena: Secondary | ICD-10-CM

## 2012-05-17 DIAGNOSIS — J309 Allergic rhinitis, unspecified: Secondary | ICD-10-CM

## 2012-05-17 LAB — CBC WITH DIFFERENTIAL/PLATELET
Basophils Absolute: 0 10*3/uL (ref 0.0–0.1)
Basophils Relative: 0 % (ref 0–1)
Eosinophils Absolute: 0.1 10*3/uL (ref 0.0–0.7)
Eosinophils Relative: 1 % (ref 0–5)
HCT: 24 % — ABNORMAL LOW (ref 39.0–52.0)
Hemoglobin: 6.8 g/dL — CL (ref 13.0–17.0)
Lymphocytes Relative: 13 % (ref 12–46)
Lymphs Abs: 1.1 10*3/uL (ref 0.7–4.0)
MCH: 22.7 pg — ABNORMAL LOW (ref 26.0–34.0)
MCHC: 28.3 g/dL — ABNORMAL LOW (ref 30.0–36.0)
MCV: 80.3 fL (ref 78.0–100.0)
Monocytes Absolute: 0.8 10*3/uL (ref 0.1–1.0)
Monocytes Relative: 10 % (ref 3–12)
Neutro Abs: 6.2 10*3/uL (ref 1.7–7.7)
Neutrophils Relative %: 76 % (ref 43–77)
Platelets: 286 10*3/uL (ref 150–400)
RBC: 2.99 MIL/uL — ABNORMAL LOW (ref 4.22–5.81)
RDW: 17.2 % — ABNORMAL HIGH (ref 11.5–15.5)
WBC: 8.2 10*3/uL (ref 4.0–10.5)

## 2012-05-17 LAB — COMPREHENSIVE METABOLIC PANEL WITH GFR
ALT: 11 U/L (ref 0–53)
AST: 20 U/L (ref 0–37)
Albumin: 3.2 g/dL — ABNORMAL LOW (ref 3.5–5.2)
Alkaline Phosphatase: 105 U/L (ref 39–117)
BUN: 9 mg/dL (ref 6–23)
CO2: 28 meq/L (ref 19–32)
Calcium: 8.5 mg/dL (ref 8.4–10.5)
Chloride: 98 meq/L (ref 96–112)
Creatinine, Ser: 0.77 mg/dL (ref 0.50–1.35)
GFR calc Af Amer: >90 mL/min
GFR calc non Af Amer: 90 mL/min — ABNORMAL LOW
Glucose, Bld: 204 mg/dL — ABNORMAL HIGH (ref 70–99)
Potassium: 4.2 meq/L (ref 3.5–5.1)
Sodium: 136 meq/L (ref 135–145)
Total Bilirubin: 0.3 mg/dL (ref 0.3–1.2)
Total Protein: 6.5 g/dL (ref 6.0–8.3)

## 2012-05-17 LAB — APTT: aPTT: 27 seconds (ref 24–37)

## 2012-05-17 LAB — RETICULOCYTES
RBC.: 2.95 MIL/uL — ABNORMAL LOW (ref 4.22–5.81)
Retic Count, Absolute: 129.8 10*3/uL (ref 19.0–186.0)
Retic Ct Pct: 4.4 % — ABNORMAL HIGH (ref 0.4–3.1)

## 2012-05-17 LAB — PRO B NATRIURETIC PEPTIDE: Pro B Natriuretic peptide (BNP): 375 pg/mL — ABNORMAL HIGH (ref 0–125)

## 2012-05-17 LAB — HEMOGLOBIN AND HEMATOCRIT, BLOOD: HCT: 24.8 % — ABNORMAL LOW (ref 39.0–52.0)

## 2012-05-17 LAB — POCT I-STAT TROPONIN I: Troponin i, poc: 0.01 ng/mL (ref 0.00–0.08)

## 2012-05-17 LAB — GLUCOSE, CAPILLARY
Glucose-Capillary: 117 mg/dL — ABNORMAL HIGH (ref 70–99)
Glucose-Capillary: 152 mg/dL — ABNORMAL HIGH (ref 70–99)

## 2012-05-17 LAB — OCCULT BLOOD, POC DEVICE: Fecal Occult Bld: NEGATIVE

## 2012-05-17 LAB — PREPARE RBC (CROSSMATCH)

## 2012-05-17 MED ORDER — ALBUTEROL SULFATE (5 MG/ML) 0.5% IN NEBU
2.5000 mg | INHALATION_SOLUTION | RESPIRATORY_TRACT | Status: DC | PRN
  Administered 2012-05-20: 2.5 mg via RESPIRATORY_TRACT
  Filled 2012-05-17: qty 0.5

## 2012-05-17 MED ORDER — FOLIC ACID 1 MG PO TABS
1.0000 mg | ORAL_TABLET | Freq: Every day | ORAL | Status: DC
  Administered 2012-05-17 – 2012-05-21 (×4): 1 mg via ORAL
  Filled 2012-05-17 (×5): qty 1

## 2012-05-17 MED ORDER — GUAIFENESIN-DM 100-10 MG/5ML PO SYRP
5.0000 mL | ORAL_SOLUTION | ORAL | Status: DC | PRN
  Administered 2012-05-20: 5 mL via ORAL
  Filled 2012-05-17 (×2): qty 10

## 2012-05-17 MED ORDER — FUROSEMIDE 10 MG/ML IJ SOLN
20.0000 mg | Freq: Once | INTRAMUSCULAR | Status: AC
  Administered 2012-05-17: 20 mg via INTRAVENOUS
  Filled 2012-05-17: qty 2

## 2012-05-17 MED ORDER — CITALOPRAM HYDROBROMIDE 10 MG PO TABS
10.0000 mg | ORAL_TABLET | Freq: Every day | ORAL | Status: DC
  Administered 2012-05-18 – 2012-05-21 (×4): 10 mg via ORAL
  Filled 2012-05-17 (×4): qty 1

## 2012-05-17 MED ORDER — INSULIN ASPART 100 UNIT/ML ~~LOC~~ SOLN
0.0000 [IU] | Freq: Four times a day (QID) | SUBCUTANEOUS | Status: DC
  Administered 2012-05-17 – 2012-05-18 (×2): 2 [IU] via SUBCUTANEOUS
  Administered 2012-05-18: 1 [IU] via SUBCUTANEOUS
  Administered 2012-05-18: 2 [IU] via SUBCUTANEOUS
  Administered 2012-05-19 – 2012-05-20 (×6): 1 [IU] via SUBCUTANEOUS
  Administered 2012-05-20 (×2): 2 [IU] via SUBCUTANEOUS
  Administered 2012-05-21: 3 [IU] via SUBCUTANEOUS
  Administered 2012-05-21 (×2): 2 [IU] via SUBCUTANEOUS

## 2012-05-17 MED ORDER — ONDANSETRON HCL 4 MG/2ML IJ SOLN
4.0000 mg | Freq: Once | INTRAMUSCULAR | Status: AC
  Administered 2012-05-17: 4 mg via INTRAVENOUS
  Filled 2012-05-17: qty 2

## 2012-05-17 MED ORDER — BUDESONIDE-FORMOTEROL FUMARATE 160-4.5 MCG/ACT IN AERO
2.0000 | INHALATION_SPRAY | Freq: Two times a day (BID) | RESPIRATORY_TRACT | Status: DC
  Administered 2012-05-17 – 2012-05-21 (×8): 2 via RESPIRATORY_TRACT
  Filled 2012-05-17: qty 6

## 2012-05-17 MED ORDER — POLYSACCHARIDE IRON COMPLEX 150 MG PO CAPS
150.0000 mg | ORAL_CAPSULE | Freq: Two times a day (BID) | ORAL | Status: DC
  Administered 2012-05-17 – 2012-05-21 (×7): 150 mg via ORAL
  Filled 2012-05-17 (×10): qty 1

## 2012-05-17 MED ORDER — SODIUM CHLORIDE 0.9 % IV SOLN
1000.0000 mL | INTRAVENOUS | Status: DC
  Administered 2012-05-17: 1000 mL via INTRAVENOUS

## 2012-05-17 MED ORDER — ZOLPIDEM TARTRATE 10 MG PO TABS
10.0000 mg | ORAL_TABLET | Freq: Every day | ORAL | Status: DC
  Administered 2012-05-17 – 2012-05-20 (×4): 10 mg via ORAL
  Filled 2012-05-17 (×4): qty 1

## 2012-05-17 MED ORDER — SODIUM CHLORIDE 0.9 % IJ SOLN
3.0000 mL | Freq: Two times a day (BID) | INTRAMUSCULAR | Status: DC
  Administered 2012-05-17 – 2012-05-21 (×6): 3 mL via INTRAVENOUS

## 2012-05-17 MED ORDER — ONDANSETRON HCL 4 MG/2ML IJ SOLN: 4.0000 mg | Freq: Four times a day (QID) | INTRAMUSCULAR | Status: DC | PRN

## 2012-05-17 MED ORDER — ATORVASTATIN CALCIUM 10 MG PO TABS
10.0000 mg | ORAL_TABLET | Freq: Every day | ORAL | Status: DC
  Administered 2012-05-18 – 2012-05-20 (×3): 10 mg via ORAL
  Filled 2012-05-17 (×4): qty 1

## 2012-05-17 MED ORDER — TECHNETIUM TC 99M-LABELED RED BLOOD CELLS IV KIT
22.0000 | PACK | Freq: Once | INTRAVENOUS | Status: AC | PRN
  Administered 2012-05-17: 22 via INTRAVENOUS

## 2012-05-17 MED ORDER — METOPROLOL TARTRATE 25 MG PO TABS
25.0000 mg | ORAL_TABLET | Freq: Two times a day (BID) | ORAL | Status: DC
  Administered 2012-05-17 – 2012-05-21 (×9): 25 mg via ORAL
  Filled 2012-05-17 (×10): qty 1

## 2012-05-17 MED ORDER — ALPRAZOLAM 0.25 MG PO TABS
0.2500 mg | ORAL_TABLET | Freq: Three times a day (TID) | ORAL | Status: DC | PRN
  Administered 2012-05-17 – 2012-05-21 (×8): 0.25 mg via ORAL
  Filled 2012-05-17 (×11): qty 1

## 2012-05-17 MED ORDER — HYDROCODONE-ACETAMINOPHEN 5-325 MG PO TABS
1.0000 | ORAL_TABLET | ORAL | Status: DC | PRN
  Administered 2012-05-17: 2 via ORAL
  Administered 2012-05-18 (×2): 1 via ORAL
  Administered 2012-05-19 (×2): 2 via ORAL
  Administered 2012-05-19 – 2012-05-21 (×2): 1 via ORAL
  Filled 2012-05-17: qty 2
  Filled 2012-05-17 (×2): qty 1
  Filled 2012-05-17 (×8): qty 2

## 2012-05-17 MED ORDER — PANTOPRAZOLE SODIUM 40 MG IV SOLR
8.0000 mg/h | INTRAVENOUS | Status: DC
  Administered 2012-05-17 – 2012-05-20 (×5): 8 mg/h via INTRAVENOUS
  Filled 2012-05-17 (×11): qty 80

## 2012-05-17 MED ORDER — ONDANSETRON HCL 4 MG PO TABS: 4.0000 mg | ORAL_TABLET | Freq: Four times a day (QID) | ORAL | Status: DC | PRN
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
## 2012-05-17 NOTE — ED Notes (Signed)
Family at bedside.wife

## 2012-05-17 NOTE — ED Notes (Signed)
X-ray at bedside

## 2012-05-17 NOTE — ED Notes (Signed)
Attempted to call report to 4W, awaiting rn to return call
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
## 2012-05-17 NOTE — ED Notes (Signed)
MD at bedside. Admitting md. 
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
## 2012-05-17 NOTE — ED Notes (Signed)
Pt from home with reports of shortness of breath with no improvement in symptoms for 10 days. Pt reports hx of same with recent hospitalization as well as endoscopy that showed areas of bleeding. Pt being followed by Gastroenterologist.

## 2012-05-17 NOTE — ED Notes (Signed)
MD at bedside. 

## 2012-05-17 NOTE — Progress Notes (Signed)
[name], is a 71 y.o. male,   MRN: [id number]  -  DOB - 1941-07-25  Outpatient Primary MD for the patient is [name], MD  in for    Chief Complaint  Patient presents with  . Shortness of Breath     Blood pressure 121/74, pulse 75, temperature 98.1 F (36.7 C), temperature source Oral, resp. rate 20, height 6' 2.5" (1.892 m), weight 122.018 kg (269 lb), SpO2 99.00%.  Principal Problem:  *Anemia Active Problems:  HYPERTENSION  EMPHYSEMA, SEVERE  DM  GI bleeding  Dyspnea  CAD (coronary artery disease)    71 yo, very talkative man with hx CAD, CHF, Emphysema, anemia, gi bleed presents to [facility] ED cc worsening shortness of breath, pre-syncope and decreased Hg. Reports went to cardiologist for routine visit aout 3 days ago and told Hg low. Hospitalized 6/10-6/12 and 6/21-6/29 for same. EGD on 6/11 yields duodenal AVMs with bleeding fundal polyp. S/p polyp resection and cautery. Capsule endo 6/27 neg, colonoscopy 7/28 with some diverticula/internal and external hemorrhoids. Had appointment with GI md but not well enough to make it.  Work up in ED Hg 6.8, Pt hemodynamically stable. Admit to tele. Ed MD ordered anemia panel and blood transfusion.

## 2012-05-17 NOTE — ED Notes (Signed)
Gave report to 4w rn

## 2012-05-18 LAB — CBC
Hemoglobin: 7.8 g/dL — ABNORMAL LOW (ref 13.0–17.0)
MCHC: 29 g/dL — ABNORMAL LOW (ref 30.0–36.0)
Platelets: 258 10*3/uL (ref 150–400)
RBC: 3.29 MIL/uL — ABNORMAL LOW (ref 4.22–5.81)

## 2012-05-18 LAB — BASIC METABOLIC PANEL
BUN: 10 mg/dL (ref 6–23)
Calcium: 8.3 mg/dL — ABNORMAL LOW (ref 8.4–10.5)
GFR calc Af Amer: >90 mL/min (ref >90)
GFR calc non Af Amer: 85 mL/min — ABNORMAL LOW (ref >90)
Potassium: 4 mEq/L (ref 3.5–5.1)
Sodium: 135 mEq/L (ref 135–145)

## 2012-05-18 LAB — VITAMIN B12: Vitamin B-12: 457 pg/mL (ref 211–911)

## 2012-05-18 LAB — GLUCOSE, CAPILLARY
Glucose-Capillary: 172 mg/dL — ABNORMAL HIGH (ref 70–99)
Glucose-Capillary: 150 mg/dL — ABNORMAL HIGH (ref 70–99)
Glucose-Capillary: 200 mg/dL — ABNORMAL HIGH (ref 70–99)

## 2012-05-18 LAB — FERRITIN: Ferritin: 6 ng/mL — ABNORMAL LOW (ref 22–322)

## 2012-05-18 LAB — IRON AND TIBC: TIBC: 477 ug/dL — ABNORMAL HIGH (ref 215–435)

## 2012-05-18 MED ORDER — SODIUM CHLORIDE 0.9 % IV SOLN
1000.0000 mg | Freq: Every day | INTRAVENOUS | Status: AC
  Administered 2012-05-18 – 2012-05-19 (×2): 1000 mg via INTRAVENOUS
  Filled 2012-05-18 (×2): qty 20

## 2012-05-18 MED ORDER — SODIUM CHLORIDE 0.9 % IV SOLN
25.0000 mg | Freq: Once | INTRAVENOUS | Status: AC
  Administered 2012-05-18: 25 mg via INTRAVENOUS
  Filled 2012-05-18: qty 0.5

## 2012-05-18 NOTE — Progress Notes (Signed)
[facility]                                                                                Patient Demographics  [name], is a 71 y.o. male  EAV:[id number]  NWG:[id number]  DOB - [birth year]  Admit date - 05/17/2012  Admitting Physician Hollice Espy, MD  Outpatient Primary MD for the patient is [name], MD  LOS - 1   Chief Complaint  Patient presents with  . Shortness of Breath        Assessment & Plan    1.Recurrent anemia due to chronic GI blood loss which most likely is due to intermittent AVM/upper GI polyp related bleed with evidence of severe iron deficiency- patient is status post 2 units of packed RBC transfusion, H&H now appears stable, continue IV PPI drip, his nuclear tagged RBC scan was negative for any ongoing acute bleed, GI [name] is following the patient, he is scheduled for a capsule endoscopy this morning which she is already refusing. We'll continue to monitor his H&H: Be to keep hemoglobin above 8, will transfuse him with IV iron pharmacy to dose.   Patient claims that he has fired multiple staff members and physicians in the past that he's conduced so again as capsule endoscopy test was ordered without his full knowledge (however this was clearly explained to him by [name]), he further claims that he wants to be cured and does not want any further tests.    2. Exertional shortness of breath, mildly elevated BNP, few rales on exam - likely due to combination of anemia and acute on chronic diastolic CHF -  he appears comfortable and without any shortness of breath, is on 2 L nasal cannula oxygen which is baseline for him at home, status post 2 unit packed RBC transfusion and 20 mg IV Lasix x1.   3. Diabetes mellitus type-patient is n.p.o. for his nuclear tagged RBC scan, thereafter will be placed on clears, will hold his by mouth Glucophage, will place him on every 6 hour Accu-Cheks with sliding scale insulin.    CBG (last  3)   Basename 05/18/12 0739 05/18/12 0649 05/17/12 2346  GLUCAP 172* 176* 152*      4. History of COPD and emphysema- no acute issues when necessary nebulizer treatments and oxygen.    5. Essential hypertension-no acute issues - continue present regimen   6. History of coronary artery disease-EKG has no acute changes, patient is chest pain-free, aspirin will be obviously held due to #1 reason above, supportive care , stable on telemetry.     DVT Prophylaxis SCDs    Code Status: Full  Family Communication: Discussed with the patient bedside  Disposition Plan: Home    Procedures Nuclear medicine bleeding scan which was negative    Consults  GI   Antibiotics  none   Time Spent in minutes   30   DVT Prophylaxis  SCDs    Susa Raring K M.D on 05/18/2012 at 9:05 AM  Between 7am to 7pm - Pager - [phone]  After 7pm go to [email or web address] - password [email or web address]  And look for the night coverage person covering for me after  hours  [facility] Office  [phone]    Subjective:   [name] today has, No headache, No chest pain, No abdominal pain - No Nausea, No new weakness tingling or numbness, No Cough - SOB. Claims that he has fired multiple staff members last time and will do so again, says he will not agree for capsule endoscopy as he wants diagnoses without that test and wants to be cured.  Objective:   Filed Vitals:   05/17/12 2115 05/17/12 2238 05/18/12 0651 05/18/12 0658  BP: 124/75 122/75 115/72   Pulse: 86 119 87   Temp: 98 F (36.7 C) 98.1 F (36.7 C) 98.1 F (36.7 C)   TempSrc: Oral Oral Oral   Resp: 18 20 16    Height:      Weight:   123.2 kg (271 lb 9.7 oz) 122.9 kg (270 lb 15.1 oz)  SpO2:  95% 92%     Wt Readings from Last 3 Encounters:  05/18/12 122.9 kg (270 lb 15.1 oz)  05/18/12 122.9 kg (270 lb 15.1 oz)  04/18/12 122.018 kg (269 lb)     Intake/Output Summary (Last 24 hours) at 05/18/12 0905 Last data filed at  05/18/12 0656  Gross per 24 hour  Intake 1895.93 ml  Output   1575 ml  Net 320.93 ml    Exam Awake Alert, Oriented X 3, No new F.N deficits, patient is extremely anxious and unreasonable [place].AT,PERRAL Supple Neck,No JVD, No cervical lymphadenopathy appriciated.  Symmetrical Chest wall movement, Good air movement bilaterally, CTAB RRR,No Gallops,Rubs or new Murmurs, No Parasternal Heave +ve B.Sounds, Abd Soft, Non tender, No organomegaly appriciated, No rebound - guarding or rigidity. No Cyanosis, Clubbing or edema, No new Rash or bruise      Data Review   CBC  Lab 05/18/12 0638 05/18/12 0146 05/17/12 1830 05/17/12 1030  WBC -- 7.4 -- 8.2  HGB 8.0* 7.8* 7.3* 6.8*  HCT 27.1* 26.9* 24.8* 24.0*  PLT -- 258 -- 286  MCV -- 81.8 -- 80.3  MCH -- 23.7* -- 22.7*  MCHC -- 29.0* -- 28.3*  RDW -- 16.4* -- 17.2*  LYMPHSABS -- -- -- 1.1  MONOABS -- -- -- 0.8  EOSABS -- -- -- 0.1  BASOSABS -- -- -- 0.0  BANDABS -- -- -- --    Chemistries   Lab 05/18/12 0146 05/17/12 1030  NA 135 136  K 4.0 4.2  CL 97 98  CO2 32 28  GLUCOSE 137* 204*  BUN 10 9  CREATININE 0.89 0.77  CALCIUM 8.3* 8.5  MG -- --  AST -- 20  ALT -- 11  ALKPHOS -- 105  BILITOT -- 0.3   ------------------------------------------------------------------------------------------------------------------ estimated creatinine clearance is 107.6 ml/min (by C-G formula based on Cr of 0.89). ------------------------------------------------------------------------------------------------------------------ No results found for this basename: HGBA1C:2 in the last 72 hours ------------------------------------------------------------------------------------------------------------------ No results found for this basename: CHOL:2,HDL:2,LDLCALC:2,TRIG:2,CHOLHDL:2,LDLDIRECT:2 in the last 72 hours ------------------------------------------------------------------------------------------------------------------ No results found for  this basename: TSH,T4TOTAL,FREET3,T3FREE,THYROIDAB in the last 72 hours ------------------------------------------------------------------------------------------------------------------  [facility] 05/17/12 1030  VITAMINB12 457  FOLATE >20.0  FERRITIN 6*  TIBC 477*  IRON 38*  RETICCTPCT 4.4*    Coagulation profile  Lab 05/17/12 1030  INR 1.11  PROTIME --    No results found for this basename: DDIMER:2 in the last 72 hours  Cardiac Enzymes No results found for this basename: CK:3,CKMB:3,TROPONINI:3,MYOGLOBIN:3 in the last 168 hours ------------------------------------------------------------------------------------------------------------------ No components found with this basename: POCBNP:3  Micro Results No results found for this or any  previous visit (from the past 240 hour(s)).  Radiology Reports Nm Gi Blood Loss  05/17/2012  *RADIOLOGY REPORT*  Clinical Data:  Recurrent microcytic anemia, history of multiple AVMs, no recent bloody stools, remote history of tarry stools.  NUCLEAR MEDICINE GASTROINTESTINAL BLEEDING SCAN  Technique:  Sequential abdominal images were obtained following intravenous administration of Tc-22m labeled red blood cells.  Radiopharmaceutical: CURIE ULTRATAG TECHNETIUM TC 38M- LABELED RED BLOOD CELLS IV KIT  Comparison:  CT abdomen pelvis - 12/22/2009  Findings:  Anterior projection planar images of the upper abdomen demonstrates physiologic activity within the liver, spleen, abdominal aorta with excreted radiotracer within the urinary bladder.  There is no definite scintigraphic evidence of acute GI bleed.  IMPRESSION: No scintigraphic evidence of acute GI bleed.  Original Report Authenticated By: Waynard Reeds, M.D.   Dg Chest Portable 1 View  05/17/2012  *RADIOLOGY REPORT*  Clinical Data: Shortness of breath, weakness, history COPD  PORTABLE CHEST - 1 VIEW  Comparison: Most recent prior chest xray 03/27/2012, recent prior chest CT 12/22/2009   Findings: Surgical changes of prior median sternotomy with evidence of multivessel CABG including LIMA bypass.  The cardiac and mediastinal contours are unchanged.  Moderately large hiatal hernia.  Inspiratory volumes are low and are nonspecific ill- defined bibasilar opacities which may reflect atelectasis. Background emphysema with bullous change in the left apex, unchanged.  No pneumothorax.  IMPRESSION:  1.  Low inspiratory volumes with bibasilar opacities favored to reflect atelectasis. Otherwise, no acute cardiopulmonary disease. 2.  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  No pneumothorax identified. 3.  Moderately large hiatal hernia  Original Report Authenticated By: Vilma Prader    Scheduled Meds:   . atorvastatin  10 mg Oral q1800  . budesonide-formoterol  2 puff Inhalation BID  . citalopram  10 mg Oral Daily  . folic acid  1 mg Oral Daily  . furosemide  20 mg Intravenous Once  . insulin aspart  0-9 Units Subcutaneous Q6H  . iron polysaccharides  150 mg Oral BID  . metoprolol tartrate  25 mg Oral BID  . ondansetron (ZOFRAN) IV  4 mg Intravenous Once  . sodium chloride  3 mL Intravenous Q12H  . zolpidem  10 mg Oral QHS   Continuous Infusions:   . pantoprozole (PROTONIX) infusion 8 mg/hr (05/18/12 0534)  . DISCONTD: sodium chloride 1,000 mL (05/17/12 1030)   PRN Meds:.albuterol, ALPRAZolam, guaiFENesin-dextromethorphan, HYDROcodone-acetaminophen, ondansetron (ZOFRAN) IV, ondansetron, technetium labeled red blood cells

## 2012-05-18 NOTE — Progress Notes (Signed)
   CARE MANAGEMENT NOTE 05/18/2012  Patient:  NORMAL, RECINOS   Account Number:  [id number]  Date Initiated:  05/18/2012  Documentation initiated by:  Jiles Crocker  Subjective/Objective Assessment:   ADMITTED WITH SOB, ANEMIA     Action/Plan:   Primary MD is [name], MD;  LIVES AT HOME WITH SPOUSE   Anticipated DC Date:  05/25/2012   Anticipated DC Plan:  HOME/SELF CARE      DC Planning Services  CM consult                Status of service:  In process, will continue to follow Medicare Important Message given?  NA - LOS <3 / Initial given by admissions (If response is "NO", the following Medicare IM given date fields will be blank)   Per UR Regulation:  Reviewed for med. necessity/level of care/duration of stay   Comments:  05/18/2012- B Estoria Geary RN, BSN, MHA

## 2012-05-18 NOTE — Progress Notes (Signed)
Subjective: No acute events.  Objective: Vital signs in last 24 hours: Temp:  [97.1 F (36.2 C)-98.3 F (36.8 C)] 98.1 F (36.7 C) (08/02 0651) Pulse Rate:  [79-119] 87  (08/02 0651) Resp:  [16-23] 16  (08/02 0651) BP: (107-127)/(67-84) 115/72 mmHg (08/02 0651) SpO2:  [92 %-97 %] 93 % (08/02 0943) Weight:  [121.655 kg (268 lb 3.2 oz)-123.2 kg (271 lb 9.7 oz)] 122.9 kg (270 lb 15.1 oz) (08/02 0658) Last BM Date: 05/17/12  Intake/Output from previous day: 08/01 0701 - 08/02 0700 In: 1895.9 [P.O.:820; I.V.:350; Blood:725.9] Out: 1575 [Urine:1575] Intake/Output this shift:    General appearance: alert and no distress GI: soft, non-tender; bowel sounds normal; no masses,  no organomegaly  Lab Results:  Basename 05/18/12 0638 05/18/12 0146 05/17/12 1830 05/17/12 1030  WBC -- 7.4 -- 8.2  HGB 8.0* 7.8* 7.3* --  HCT 27.1* 26.9* 24.8* --  PLT -- 258 -- 286   BMET  Basename 05/18/12 0146 05/17/12 1030  NA 135 136  K 4.0 4.2  CL 97 98  CO2 32 28  GLUCOSE 137* 204*  BUN 10 9  CREATININE 0.89 0.77  CALCIUM 8.3* 8.5   LFT  Basename 05/17/12 1030  PROT 6.5  ALBUMIN 3.2*  AST 20  ALT 11  ALKPHOS 105  BILITOT 0.3  BILIDIR --  IBILI --   PT/INR  Basename 05/17/12 1030  LABPROT 14.5  INR 1.11   Hepatitis Panel No results found for this basename: HEPBSAG,HCVAB,HEPAIGM,HEPBIGM in the last 72 hours C-Diff No results found for this basename: CDIFFTOX:3 in the last 72 hours Fecal Lactopherrin No results found for this basename: FECLLACTOFRN in the last 72 hours  Studies/Results: Nm Gi Blood Loss  05/17/2012  *RADIOLOGY REPORT*  Clinical Data:  Recurrent microcytic anemia, history of multiple AVMs, no recent bloody stools, remote history of tarry stools.  NUCLEAR MEDICINE GASTROINTESTINAL BLEEDING SCAN  Technique:  Sequential abdominal images were obtained following intravenous administration of Tc-41m labeled red blood cells.  Radiopharmaceutical: CURIE ULTRATAG  TECHNETIUM TC 13M- LABELED RED BLOOD CELLS IV KIT  Comparison:  CT abdomen pelvis - 12/22/2009  Findings:  Anterior projection planar images of the upper abdomen demonstrates physiologic activity within the liver, spleen, abdominal aorta with excreted radiotracer within the urinary bladder.  There is no definite scintigraphic evidence of acute GI bleed.  IMPRESSION: No scintigraphic evidence of acute GI bleed.  Original Report Authenticated By: Waynard Reeds, M.D.   Dg Chest Portable 1 View  05/17/2012  *RADIOLOGY REPORT*  Clinical Data: Shortness of breath, weakness, history COPD  PORTABLE CHEST - 1 VIEW  Comparison: Most recent prior chest xray 03/27/2012, recent prior chest CT 12/22/2009  Findings: Surgical changes of prior median sternotomy with evidence of multivessel CABG including LIMA bypass.  The cardiac and mediastinal contours are unchanged.  Moderately large hiatal hernia.  Inspiratory volumes are low and are nonspecific ill- defined bibasilar opacities which may reflect atelectasis. Background emphysema with bullous change in the left apex, unchanged.  No pneumothorax.  IMPRESSION:  1.  Low inspiratory volumes with bibasilar opacities favored to reflect atelectasis. Otherwise, no acute cardiopulmonary disease. 2.  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  No pneumothorax identified. 3.  Moderately large hiatal hernia  Original Report Authenticated By: Vilma Prader    Medications:  Scheduled:   . atorvastatin  10 mg Oral q1800  . budesonide-formoterol  2 puff Inhalation BID  . citalopram  10 mg Oral Daily  . folic  acid  1 mg Oral Daily  . furosemide  20 mg Intravenous Once  . insulin aspart  0-9 Units Subcutaneous Q6H  . iron dextran (INFED/DEXFERRUM) infusion  25 mg Intravenous Once   Followed by  . iron dextran (INFED/DEXFERRUM) infusion  1,000 mg Intravenous Q2200  . iron polysaccharides  150 mg Oral BID  . metoprolol tartrate  25 mg Oral BID  . sodium chloride  3 mL Intravenous  Q12H  . zolpidem  10 mg Oral QHS   Continuous:   . pantoprozole (PROTONIX) infusion 8 mg/hr (05/18/12 0534)  . DISCONTD: sodium chloride 1,000 mL (05/17/12 1030)    Assessment/Plan: 1) GI Bleed.   The bleeding scan was negative, which is not surprising.  I think at this time I will pursue a repeat capsule endoscopy in hopes of isolating a source.  Again, he may require a repeat EGD even if the capsule is negative.  I explained to him the need for a repeat capsule endoscopy and it consents to the procedure.  He says that he did not remember the content of my discussion with him as he received two vicodin and a Xanax.  He also admits to being very grumpy and outline with his healthcare providers and he was sorry an Counselling psychologist.  Plan: 1) Capsule endoscopy. 2) Follow HGB and transfuse as necessary.  LOS: 1 day   Duglas Heier D 05/18/2012, 11:28 AM

## 2012-05-18 NOTE — Progress Notes (Addendum)
MEDICATION RELATED CONSULT NOTE - INITIAL   Pharmacy Consult for Iron Dextran Indication: Anemia  Allergies  Allergen Reactions  . Penicillins Anaphylaxis and Hives  . Demerol (Meperidine) Other (See Comments)    hallucinations  . Morphine And Related Nausea Only    Patient Measurements: Height: 6\' 2"  (188 cm) Weight: 270 lb 15.1 oz (122.9 kg) (pt had to void again. pt says needs to have BM) IBW/kg (Calculated) : 82.2  Adjusted Body Weight:  IBW: 82.2kg  Vital Signs: Temp: 98.1 F (36.7 C) (08/02 0651) Temp src: Oral (08/02 0651) BP: 115/72 mmHg (08/02 0651) Pulse Rate: 87  (08/02 0651) Intake/Output from previous day: 08/01 0701 - 08/02 0700 In: 1895.9 [P.O.:820; I.V.:350; Blood:725.9] Out: 1575 [Urine:1575] Intake/Output from this shift:    Labs:  Basename 05/18/12 0638 05/18/12 0146 05/17/12 1830 05/17/12 1030  WBC -- 7.4 -- 8.2  HGB 8.0* 7.8* 7.3* --  HCT 27.1* 26.9* 24.8* --  PLT -- 258 -- 286  APTT -- -- -- 27  CREATININE -- 0.89 -- 0.77  LABCREA -- -- -- --  CREATININE -- 0.89 -- 0.77  CREAT24HRUR -- -- -- --  MG -- -- -- --  PHOS -- -- -- --  ALBUMIN -- -- -- 3.2*  PROT -- -- -- 6.5  ALBUMIN -- -- -- 3.2*  AST -- -- -- 20  ALT -- -- -- 11  ALKPHOS -- -- -- 105  BILITOT -- -- -- 0.3  BILIDIR -- -- -- --  IBILI -- -- -- --   Estimated Creatinine Clearance: 107.6 ml/min (by C-G formula based on Cr of 0.89).   Microbiology: No results found for this or any previous visit (from the past 720 hour(s)).  Medical History: Past Medical History  Diagnosis Date  . Allergic rhinitis, cause unspecified   . Other and unspecified hyperlipidemia   . 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 Unspecified essential hypertension   . 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 Severe bullous disease of LUL. Prolonged intubation after surgeries due to COPD  . AAA  (abdominal aortic aneurysm) 12/2008    7cm, endovascular repair with coiling right hypogastric artery   . Anemia     Recurrent microcytic, presumably GI   . Complication of anesthesia     trouble getting off ventilator    Medications:  PTA: Niferex 150mg  BID, Protonix 40mg  BID  Assessment: 71 yoM presents with SOB and hgb 6.8, hemoccult negative in ER, with hx recurrent anemia d/t chronic GI blood loss thought to be 2/2 multiple AVMs in duodenum and bleeding polyps.  Pt has had extensive work-up in past by [name].  Pharmacy has been asked to dose IV iron therapy for patient.   Iron panel 8/1:  Iron, ferritin, saturation ratios low, elevated TIBC  Hgb has risen to 8.0 s/p PRBCs.  Pt has been on oral niferex PTA.      Goal of Therapy:  Hgb > 14.8  Plan:  1.  Test dose iron dextran 2.  If test dose tolerated, iron dextran IV 1 gram x 1 today and tomorrow.   3.  F/u CBC, iron panel, tolerability to iron  Vinessa Macconnell E 05/18/2012,10:10 AM

## 2012-05-19 ENCOUNTER — Encounter (HOSPITAL_COMMUNITY)
Admission: EM | Disposition: A | Discharge status: Home / Self Care | Payer: Self-pay | Source: Home / Self Care | Attending: Internal Medicine

## 2012-05-19 DIAGNOSIS — I2510 Atherosclerotic heart disease of native coronary artery without angina pectoris: Secondary | ICD-10-CM

## 2012-05-19 HISTORY — PX: GIVENS CAPSULE STUDY: SHX5432

## 2012-05-19 LAB — HEMOGLOBIN AND HEMATOCRIT, BLOOD
HCT: 26.3 % — ABNORMAL LOW (ref 39.0–52.0)
HCT: 33 % — ABNORMAL LOW (ref 39.0–52.0)
Hemoglobin: 7.7 g/dL — ABNORMAL LOW (ref 13.0–17.0)

## 2012-05-19 LAB — GLUCOSE, CAPILLARY: Glucose-Capillary: 145 mg/dL — ABNORMAL HIGH (ref 70–99)

## 2012-05-19 SURGERY — IMAGING PROCEDURE, GI TRACT, INTRALUMINAL, VIA CAPSULE
Anesthesia: LOCAL

## 2012-05-19 MED ORDER — FUROSEMIDE 10 MG/ML IJ SOLN
20.0000 mg | INTRAMUSCULAR | Status: AC
  Administered 2012-05-19: 20 mg via INTRAVENOUS
  Filled 2012-05-19: qty 2

## 2012-05-19 SURGICAL SUPPLY — 1 items: TOWEL COTTON PACK 4EA (MISCELLANEOUS) ×4 IMPLANT

## 2012-05-19 NOTE — Progress Notes (Signed)
[facility]                                                                                Patient Demographics  [name], is a 71 y.o. male  ZOX:[id number]  [id number]  DOB - 07-11-1941  Admit date - 05/17/2012  Admitting Physician Hollice Espy, MD  Outpatient Primary MD for the patient is [name], MD  LOS - 2   Chief Complaint  Patient presents with  . Shortness of Breath        Assessment & Plan    1.Recurrent anemia due to chronic GI blood loss which most likely is due to intermittent AVM/upper GI polyp related bleed with evidence of severe iron deficiency- patient is status post 2 units of packed RBC transfusion on 05/17/2012, H&H now trending down with him feeling weak again, he give him 2 more units of packed RBCs on 05/19/2012, continue IV PPI drip, his nuclear tagged RBC scan was negative for any ongoing acute bleed, GI [name] is following the patient, he is scheduled for a capsule endoscopy this morning, is agreeable to it today, he apologizes for his rude behavior in the past,    2. Exertional shortness of breath, mildly elevated BNP, few rales on exam upon admission- likely due to combination of anemia and acute on chronic diastolic CHF - he appears comfortable and without any shortness of breath, is on 2 L nasal cannula oxygen which is baseline for him at home, use with 20 mg of Lasix after 1 unit.    3. Diabetes mellitus type-patient is n.p.o. for his nuclear tagged RBC scan, thereafter will be placed on clears, will hold his by mouth Glucophage, will place him on every 6 hour Accu-Cheks with sliding scale insulin.    CBG (last 3)   Basename 05/19/12 0631 05/19/12 0006 05/18/12 1715  GLUCAP 127* 145* 200*      4. History of COPD and emphysema- no acute issues when necessary nebulizer treatments and oxygen.      5. Essential hypertension-no acute issues - continue present regimen     6. History of coronary artery  disease-EKG has no acute changes, patient is chest pain-free, aspirin will be obviously held due to #1 reason above, supportive care , stable on telemetry.     DVT Prophylaxis SCDs    Code Status: Full  Family Communication: Discussed with the patient bedside  Disposition Plan: Home    Procedures Nuclear medicine bleeding scan which was negative    Consults  GI   Antibiotics  none   Time Spent in minutes   30   DVT Prophylaxis  SCDs    Susa Raring K M.D on 05/19/2012 at 9:10 AM  Between 7am to 7pm - Pager - [phone]  After 7pm go to [email or web address] - password [email or web address]  And look for the night coverage person covering for me after hours  [facility] Office  [phone]    Subjective:   [name] today has, No headache, No chest pain, No abdominal pain - No Nausea, No new weakness tingling or numbness, No Cough - SOB. Claims that he has fired multiple staff members last time and  will do so again, says he will not agree for capsule endoscopy as he wants diagnoses without that test and wants to be cured.  Objective:   Filed Vitals:   05/18/12 2040 05/19/12 0300 05/19/12 0500 05/19/12 0855  BP: 118/77  146/80   Pulse: 86  80   Temp: 98.1 F (36.7 C)  97.6 F (36.4 C)   TempSrc: Oral  Oral   Resp: 19  19   Height:      Weight:   123.4 kg (272 lb 0.8 oz)   SpO2: 94% 97% 95% 90%    Wt Readings from Last 3 Encounters:  05/19/12 123.4 kg (272 lb 0.8 oz)  05/19/12 123.4 kg (272 lb 0.8 oz)  04/18/12 122.018 kg (269 lb)     Intake/Output Summary (Last 24 hours) at 05/19/12 0910 Last data filed at 05/19/12 0859  Gross per 24 hour  Intake 335.42 ml  Output   1375 ml  Net -1039.58 ml    Exam Awake Alert, Oriented X 3, No new F.N deficits, patient is extremely anxious and unreasonable [place].AT,PERRAL Supple Neck,No JVD, No cervical lymphadenopathy appriciated.  Symmetrical Chest wall movement, Good air movement bilaterally, CTAB RRR,No  Gallops,Rubs or new Murmurs, No Parasternal Heave +ve B.Sounds, Abd Soft, Non tender, No organomegaly appriciated, No rebound - guarding or rigidity. No Cyanosis, Clubbing or edema, No new Rash or bruise      Data Review   CBC  Lab 05/19/12 0715 05/18/12 1544 05/18/12 0638 05/18/12 0146 05/17/12 1830 05/17/12 1030  WBC -- -- -- 7.4 -- 8.2  HGB 7.7* 8.2* 8.0* 7.8* 7.3* --  HCT 26.3* 27.9* 27.1* 26.9* 24.8* --  PLT -- -- -- 258 -- 286  MCV -- -- -- 81.8 -- 80.3  MCH -- -- -- 23.7* -- 22.7*  MCHC -- -- -- 29.0* -- 28.3*  RDW -- -- -- 16.4* -- 17.2*  LYMPHSABS -- -- -- -- -- 1.1  MONOABS -- -- -- -- -- 0.8  EOSABS -- -- -- -- -- 0.1  BASOSABS -- -- -- -- -- 0.0  BANDABS -- -- -- -- -- --    Chemistries   Lab 05/18/12 0146 05/17/12 1030  NA 135 136  K 4.0 4.2  CL 97 98  CO2 32 28  GLUCOSE 137* 204*  BUN 10 9  CREATININE 0.89 0.77  CALCIUM 8.3* 8.5  MG -- --  AST -- 20  ALT -- 11  ALKPHOS -- 105  BILITOT -- 0.3   ------------------------------------------------------------------------------------------------------------------ estimated creatinine clearance is 107.8 ml/min (by C-G formula based on Cr of 0.89). ------------------------------------------------------------------------------------------------------------------ No results found for this basename: HGBA1C:2 in the last 72 hours ------------------------------------------------------------------------------------------------------------------ No results found for this basename: CHOL:2,HDL:2,LDLCALC:2,TRIG:2,CHOLHDL:2,LDLDIRECT:2 in the last 72 hours ------------------------------------------------------------------------------------------------------------------ No results found for this basename: TSH,T4TOTAL,FREET3,T3FREE,THYROIDAB in the last 72 hours ------------------------------------------------------------------------------------------------------------------  [facility] 05/17/12 1030  VITAMINB12 457    FOLATE >20.0  FERRITIN 6*  TIBC 477*  IRON 38*  RETICCTPCT 4.4*    Coagulation profile  Lab 05/17/12 1030  INR 1.11  PROTIME --    No results found for this basename: DDIMER:2 in the last 72 hours  Cardiac Enzymes No results found for this basename: CK:3,CKMB:3,TROPONINI:3,MYOGLOBIN:3 in the last 168 hours ------------------------------------------------------------------------------------------------------------------ No components found with this basename: POCBNP:3  Micro Results No results found for this or any previous visit (from the past 240 hour(s)).  Radiology Reports Nm Gi Blood Loss  05/17/2012  *RADIOLOGY REPORT*  Clinical Data:  Recurrent microcytic anemia, history  of multiple AVMs, no recent bloody stools, remote history of tarry stools.  NUCLEAR MEDICINE GASTROINTESTINAL BLEEDING SCAN  Technique:  Sequential abdominal images were obtained following intravenous administration of Tc-35m labeled red blood cells.  Radiopharmaceutical: CURIE ULTRATAG TECHNETIUM TC 62M- LABELED RED BLOOD CELLS IV KIT  Comparison:  CT abdomen pelvis - 12/22/2009  Findings:  Anterior projection planar images of the upper abdomen demonstrates physiologic activity within the liver, spleen, abdominal aorta with excreted radiotracer within the urinary bladder.  There is no definite scintigraphic evidence of acute GI bleed.  IMPRESSION: No scintigraphic evidence of acute GI bleed.  Original Report Authenticated By: Waynard Reeds, M.D.   Dg Chest Portable 1 View  05/17/2012  *RADIOLOGY REPORT*  Clinical Data: Shortness of breath, weakness, history COPD  PORTABLE CHEST - 1 VIEW  Comparison: Most recent prior chest xray 03/27/2012, recent prior chest CT 12/22/2009  Findings: Surgical changes of prior median sternotomy with evidence of multivessel CABG including LIMA bypass.  The cardiac and mediastinal contours are unchanged.  Moderately large hiatal hernia.  Inspiratory volumes are low and are  nonspecific ill- defined bibasilar opacities which may reflect atelectasis. Background emphysema with bullous change in the left apex, unchanged.  No pneumothorax.  IMPRESSION:  1.  Low inspiratory volumes with bibasilar opacities favored to reflect atelectasis. Otherwise, no acute cardiopulmonary disease. 2.  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  No pneumothorax identified. 3.  Moderately large hiatal hernia  Original Report Authenticated By: Vilma Prader    Scheduled Meds:    . atorvastatin  10 mg Oral q1800  . budesonide-formoterol  2 puff Inhalation BID  . citalopram  10 mg Oral Daily  . folic acid  1 mg Oral Daily  . furosemide  20 mg Intravenous Q4H  . insulin aspart  0-9 Units Subcutaneous Q6H  . iron dextran (INFED/DEXFERRUM) infusion  25 mg Intravenous Once   Followed by  . iron dextran (INFED/DEXFERRUM) infusion  1,000 mg Intravenous Q2200  . iron polysaccharides  150 mg Oral BID  . metoprolol tartrate  25 mg Oral BID  . sodium chloride  3 mL Intravenous Q12H  . zolpidem  10 mg Oral QHS   Continuous Infusions:    . pantoprozole (PROTONIX) infusion 8 mg/hr (05/19/12 0701)   PRN Meds:.albuterol, ALPRAZolam, guaiFENesin-dextromethorphan, HYDROcodone-acetaminophen, ondansetron (ZOFRAN) IV, ondansetron

## 2012-05-20 ENCOUNTER — Inpatient Hospital Stay (HOSPITAL_COMMUNITY): Payer: Medicare Other

## 2012-05-20 DIAGNOSIS — K922 Gastrointestinal hemorrhage, unspecified: Secondary | ICD-10-CM

## 2012-05-20 DIAGNOSIS — D649 Anemia, unspecified: Secondary | ICD-10-CM

## 2012-05-20 DIAGNOSIS — K921 Melena: Secondary | ICD-10-CM

## 2012-05-20 LAB — GLUCOSE, CAPILLARY
Glucose-Capillary: 155 mg/dL — ABNORMAL HIGH (ref 70–99)
Glucose-Capillary: 138 mg/dL — ABNORMAL HIGH (ref 70–99)
Glucose-Capillary: 189 mg/dL — ABNORMAL HIGH (ref 70–99)

## 2012-05-20 LAB — BASIC METABOLIC PANEL
CO2: 29 mEq/L (ref 19–32)
Calcium: 8.9 mg/dL (ref 8.4–10.5)
Chloride: 102 mEq/L (ref 96–112)
GFR calc Af Amer: >90 mL/min (ref >90)
Sodium: 139 mEq/L (ref 135–145)

## 2012-05-20 LAB — MAGNESIUM: Magnesium: 2.2 mg/dL (ref 1.5–2.5)

## 2012-05-20 MED ORDER — FUROSEMIDE 10 MG/ML IJ SOLN
20.0000 mg | Freq: Once | INTRAMUSCULAR | Status: AC
  Administered 2012-05-20: 20 mg via INTRAVENOUS
  Filled 2012-05-20: qty 2

## 2012-05-20 MED ORDER — PANTOPRAZOLE SODIUM 40 MG PO TBEC
40.0000 mg | DELAYED_RELEASE_TABLET | Freq: Two times a day (BID) | ORAL | Status: DC
  Administered 2012-05-20 – 2012-05-21 (×3): 40 mg via ORAL
  Filled 2012-05-20 (×5): qty 1

## 2012-05-20 NOTE — Progress Notes (Signed)
I am covering this patient today for Drs. Nicholes Mango.   [facility] Progress Note    Since last GI note: He completed small bowel capsule yesterday, tells me it was his third such test.  Has received 4 units blood total this admission.  Stools always a bit dark since he's been on iron.  No abd pain, ate regular food this AM.  Objective: Vital signs in last 24 hours: Temp:  [97.3 F (36.3 C)-98.9 F (37.2 C)] 97.3 F (36.3 C) (08/04 0612) Pulse Rate:  [70-91] 77  (08/04 0612) Resp:  [16-20] 18  (08/04 0612) BP: (124-159)/(68-88) 125/71 mmHg (08/04 0612) SpO2:  [92 %-93 %] 93 % (08/04 0907) Last BM Date: 05/19/12 General: alert and oriented times 3 Heart: regular rate and rythm Abdomen: soft, non-tender, non-distended, normal bowel sounds   Lab Results:  Basename 05/20/12 0135 05/19/12 2142 05/19/12 0715 05/18/12 0146 05/17/12 1030  WBC -- -- -- 7.4 8.2  HGB 9.4* 10.3* 7.7* -- --  PLT -- -- -- 258 286  MCV -- -- -- 81.8 80.3    Basename 05/18/12 0146 05/17/12 1030  NA 135 136  K 4.0 4.2  CL 97 98  CO2 32 28  GLUCOSE 137* 204*  BUN 10 9  CREATININE 0.89 0.77  CALCIUM 8.3* 8.5    Basename 05/17/12 1030  PROT 6.5  ALBUMIN 3.2*  AST 20  ALT 11  ALKPHOS 105  BILITOT 0.3  BILIDIR --  IBILI --    Basename 05/17/12 1030  INR 1.11     Studies/Results: No results found.   Medications: Scheduled Meds:   . atorvastatin  10 mg Oral q1800  . budesonide-formoterol  2 puff Inhalation BID  . citalopram  10 mg Oral Daily  . folic acid  1 mg Oral Daily  . furosemide  20 mg Intravenous Q4H  . furosemide  20 mg Intravenous Once  . insulin aspart  0-9 Units Subcutaneous Q6H  . iron dextran (INFED/DEXFERRUM) infusion  1,000 mg Intravenous Q2200  . iron polysaccharides  150 mg Oral BID  . metoprolol tartrate  25 mg Oral BID  . pantoprazole  40 mg Oral BID AC  . sodium chloride  3 mL Intravenous Q12H  . zolpidem  10 mg Oral QHS   Continuous  Infusions:   . DISCONTD: pantoprozole (PROTONIX) infusion 8 mg/hr (05/20/12 0413)   PRN Meds:.albuterol, ALPRAZolam, guaiFENesin-dextromethorphan, HYDROcodone-acetaminophen, ondansetron (ZOFRAN) IV, ondansetron    Assessment/Plan: 71 y.o. male GI bleeding, obscure  Continue to let him eat, observe and repeat CBC in AM. [name] will be back tomorrow, will review capsule test.    Rob Bunting, MD  05/20/2012, 9:25 AM [facility] Pager [phone]

## 2012-05-20 NOTE — Progress Notes (Signed)
[facility]                                                                                Patient Demographics  [name], is a 71 y.o. male  ONG:[id number]  GMW:[id number]  DOB - 1941-06-08  Admit date - 05/17/2012  Admitting Physician Hollice Espy, MD  Outpatient Primary MD for the patient is [name], MD  LOS - 3   Chief Complaint  Patient presents with  . Shortness of Breath        Assessment & Plan    1.Recurrent anemia due to chronic GI blood loss which most likely is due to intermittent AVM/upper GI polyp related bleed with evidence of severe iron deficiency - patient is status post 4 units of packed RBC transfusion since admission, his nuclear tagged RBC bleeding scan was negative, H&H is relatively stable, being seen by GI, had capsule endoscopy done on 05/19/2012 results are awaited. H&H today has been canceled by the GI team.     2. Exertional shortness of breath, mildly elevated BNP, few rales on exam upon admission- likely due to combination of anemia and acute on chronic diastolic CHF - will give him another round of IV Lasix 20 mg, continue nasal cannula oxygen and nebulizer treatments as needed. Chest x-ray remains unremarkable.    3. Diabetes mellitus type-patient is n.p.o. for his nuclear tagged RBC scan, thereafter will be placed on clears, will hold his by mouth Glucophage, will place him on every 6 hour Accu-Cheks with sliding scale insulin.    CBG (last 3)   Basename 05/20/12 0611 05/20/12 0227 05/19/12 2032  GLUCAP 138* 155* 155*      4. History of COPD and emphysema- no acute issues when necessary nebulizer treatments and oxygen.      5. Essential hypertension-no acute issues - continue present regimen     6. History of coronary artery disease-EKG has no acute changes, patient is chest pain-free, aspirin will be obviously held due to #1 reason above, supportive care , stable on telemetry.      DVT  Prophylaxis SCDs    Code Status: Full  Family Communication: Discussed with the patient bedside  Disposition Plan: Home    Procedures Nuclear medicine bleeding scan which was negative, capsule endoscopy    Consults  GI   Antibiotics  none   Time Spent in minutes   30   DVT Prophylaxis  SCDs    Susa Raring K M.D on 05/20/2012 at 10:58 AM  Between 7am to 7pm - Pager - [phone]  After 7pm go to [email or web address] - password [email or web address]  And look for the night coverage person covering for me after hours  [facility] Office  [phone]    Subjective:   [name] today has, No headache, No chest pain, No abdominal pain - No Nausea, No new weakness tingling or numbness, No Cough - SOB. Claims that he has fired multiple staff members last time and will do so again, says he will not agree for capsule endoscopy as he wants diagnoses without that test and wants to be cured.  Objective:   Filed Vitals:   05/19/12 2027 05/19/12 2151  05/20/12 0612 05/20/12 0907  BP:  131/76 125/71   Pulse:  85 77   Temp:  97.6 F (36.4 C) 97.3 F (36.3 C)   TempSrc:  Oral Oral   Resp:  20 18   Height:      Weight:      SpO2: 92% 92% 92% 93%    Wt Readings from Last 3 Encounters:  05/19/12 123.4 kg (272 lb 0.8 oz)  05/19/12 123.4 kg (272 lb 0.8 oz)  04/18/12 122.018 kg (269 lb)     Intake/Output Summary (Last 24 hours) at 05/20/12 1058 Last data filed at 05/20/12 0900  Gross per 24 hour  Intake   2390 ml  Output    875 ml  Net   1515 ml    Exam Awake Alert, Oriented X 3, No new F.N deficits, patient is extremely anxious and unreasonable [place].AT,PERRAL Supple Neck,No JVD, No cervical lymphadenopathy appriciated.  Symmetrical Chest wall movement, Good air movement bilaterally, CTAB RRR,No Gallops,Rubs or new Murmurs, No Parasternal Heave +ve B.Sounds, Abd Soft, Non tender, No organomegaly appriciated, No rebound - guarding or rigidity. No Cyanosis, Clubbing or  edema, No new Rash or bruise      Data Review   CBC  Lab 05/20/12 0135 05/19/12 2142 05/19/12 0715 05/18/12 1544 05/18/12 0638 05/18/12 0146 05/17/12 1030  WBC -- -- -- -- -- 7.4 8.2  HGB 9.4* 10.3* 7.7* 8.2* 8.0* -- --  HCT 30.9* 33.0* 26.3* 27.9* 27.1* -- --  PLT -- -- -- -- -- 258 286  MCV -- -- -- -- -- 81.8 80.3  MCH -- -- -- -- -- 23.7* 22.7*  MCHC -- -- -- -- -- 29.0* 28.3*  RDW -- -- -- -- -- 16.4* 17.2*  LYMPHSABS -- -- -- -- -- -- 1.1  MONOABS -- -- -- -- -- -- 0.8  EOSABS -- -- -- -- -- -- 0.1  BASOSABS -- -- -- -- -- -- 0.0  BANDABS -- -- -- -- -- -- --    Chemistries   Lab 05/20/12 0940 05/18/12 0146 05/17/12 1030  NA 139 135 136  K 3.9 4.0 4.2  CL 102 97 98  CO2 29 32 28  GLUCOSE 179* 137* 204*  BUN 6 10 9   CREATININE 0.76 0.89 0.77  CALCIUM 8.9 8.3* 8.5  MG 2.2 -- --  AST -- -- 20  ALT -- -- 11  ALKPHOS -- -- 105  BILITOT -- -- 0.3   ------------------------------------------------------------------------------------------------------------------ estimated creatinine clearance is 119.9 ml/min (by C-G formula based on Cr of 0.76). ------------------------------------------------------------------------------------------------------------------ No results found for this basename: HGBA1C:2 in the last 72 hours ------------------------------------------------------------------------------------------------------------------ No results found for this basename: CHOL:2,HDL:2,LDLCALC:2,TRIG:2,CHOLHDL:2,LDLDIRECT:2 in the last 72 hours ------------------------------------------------------------------------------------------------------------------ No results found for this basename: TSH,T4TOTAL,FREET3,T3FREE,THYROIDAB in the last 72 hours ------------------------------------------------------------------------------------------------------------------ No results found for this basename: VITAMINB12:2,FOLATE:2,FERRITIN:2,TIBC:2,IRON:2,RETICCTPCT:2 in the last 72  hours  Coagulation profile  Lab 05/17/12 1030  INR 1.11  PROTIME --    No results found for this basename: DDIMER:2 in the last 72 hours  Cardiac Enzymes No results found for this basename: CK:3,CKMB:3,TROPONINI:3,MYOGLOBIN:3 in the last 168 hours ------------------------------------------------------------------------------------------------------------------ No components found with this basename: POCBNP:3  Micro Results No results found for this or any previous visit (from the past 240 hour(s)).  Radiology Reports Nm Gi Blood Loss  05/17/2012  *RADIOLOGY REPORT*  Clinical Data:  Recurrent microcytic anemia, history of multiple AVMs, no recent bloody stools, remote history of tarry stools.  NUCLEAR MEDICINE GASTROINTESTINAL BLEEDING SCAN  Technique:  Sequential abdominal images were obtained following intravenous administration of Tc-69m labeled red blood cells.  Radiopharmaceutical: CURIE ULTRATAG TECHNETIUM TC 70M- LABELED RED BLOOD CELLS IV KIT  Comparison:  CT abdomen pelvis - 12/22/2009  Findings:  Anterior projection planar images of the upper abdomen demonstrates physiologic activity within the liver, spleen, abdominal aorta with excreted radiotracer within the urinary bladder.  There is no definite scintigraphic evidence of acute GI bleed.  IMPRESSION: No scintigraphic evidence of acute GI bleed.  Original Report Authenticated By: Waynard Reeds, M.D.   Dg Chest Portable 1 View  05/17/2012  *RADIOLOGY REPORT*  Clinical Data: Shortness of breath, weakness, history COPD  PORTABLE CHEST - 1 VIEW  Comparison: Most recent prior chest xray 03/27/2012, recent prior chest CT 12/22/2009  Findings: Surgical changes of prior median sternotomy with evidence of multivessel CABG including LIMA bypass.  The cardiac and mediastinal contours are unchanged.  Moderately large hiatal hernia.  Inspiratory volumes are low and are nonspecific ill- defined bibasilar opacities which may reflect  atelectasis. Background emphysema with bullous change in the left apex, unchanged.  No pneumothorax.  IMPRESSION:  1.  Low inspiratory volumes with bibasilar opacities favored to reflect atelectasis. Otherwise, no acute cardiopulmonary disease. 2.  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  No pneumothorax identified. 3.  Moderately large hiatal hernia  Original Report Authenticated By: Vilma Prader    Scheduled Meds:    . atorvastatin  10 mg Oral q1800  . budesonide-formoterol  2 puff Inhalation BID  . citalopram  10 mg Oral Daily  . folic acid  1 mg Oral Daily  . furosemide  20 mg Intravenous Q4H  . furosemide  20 mg Intravenous Once  . insulin aspart  0-9 Units Subcutaneous Q6H  . iron dextran (INFED/DEXFERRUM) infusion  1,000 mg Intravenous Q2200  . iron polysaccharides  150 mg Oral BID  . metoprolol tartrate  25 mg Oral BID  . pantoprazole  40 mg Oral BID AC  . sodium chloride  3 mL Intravenous Q12H  . zolpidem  10 mg Oral QHS   Continuous Infusions:    . DISCONTD: pantoprozole (PROTONIX) infusion 8 mg/hr (05/20/12 0413)   PRN Meds:.albuterol, ALPRAZolam, guaiFENesin-dextromethorphan, HYDROcodone-acetaminophen, ondansetron (ZOFRAN) IV, ondansetron

## 2012-05-21 ENCOUNTER — Encounter (HOSPITAL_COMMUNITY): Payer: Self-pay | Admitting: Gastroenterology

## 2012-05-21 LAB — TYPE AND SCREEN
Unit division: 0
Unit division: 0

## 2012-05-21 LAB — CBC
MCHC: 30 g/dL (ref 30.0–36.0)
MCV: 82.4 fL (ref 78.0–100.0)
Platelets: 242 10*3/uL (ref 150–400)
RDW: 17 % — ABNORMAL HIGH (ref 11.5–15.5)
WBC: 8 10*3/uL (ref 4.0–10.5)

## 2012-05-21 LAB — GLUCOSE, CAPILLARY
Glucose-Capillary: 155 mg/dL — ABNORMAL HIGH (ref 70–99)
Glucose-Capillary: 183 mg/dL — ABNORMAL HIGH (ref 70–99)

## 2012-05-21 MED ORDER — POLYSACCHARIDE IRON COMPLEX 150 MG PO CAPS: 150.0000 mg | ORAL_CAPSULE | Freq: Two times a day (BID) | ORAL | Status: DC

## 2012-05-21 MED ORDER — PANTOPRAZOLE SODIUM 40 MG PO TBEC: 40.0000 mg | DELAYED_RELEASE_TABLET | Freq: Two times a day (BID) | ORAL | Status: DC

## 2012-05-21 MED ORDER — FOLIC ACID 1 MG PO TABS: 1.0000 mg | ORAL_TABLET | Freq: Every day | ORAL | Status: DC

## 2012-05-21 MED ORDER — ASPIRIN 81 MG PO CHEW: 81.0000 mg | CHEWABLE_TABLET | Freq: Every day | ORAL | Status: DC
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
## 2012-05-23 ENCOUNTER — Other Ambulatory Visit: Payer: Self-pay | Admitting: *Deleted

## 2012-05-23 MED ORDER — ZOLPIDEM TARTRATE 10 MG PO TABS: 10.0000 mg | ORAL_TABLET | Freq: Every day | ORAL | Status: DC

## 2012-05-23 NOTE — Telephone Encounter (Signed)
[facility] 

## 2012-06-06 ENCOUNTER — Other Ambulatory Visit: Payer: Self-pay | Admitting: *Deleted

## 2012-06-06 MED ORDER — ALPRAZOLAM 0.25 MG PO TABS: 0.2500 mg | ORAL_TABLET | Freq: Three times a day (TID) | ORAL | Status: DC | PRN

## 2012-06-06 MED ORDER — HYDROCODONE-ACETAMINOPHEN 5-500 MG PO TABS: 1.0000 | ORAL_TABLET | ORAL | Status: DC | PRN

## 2012-06-06 NOTE — Telephone Encounter (Signed)
Medicines called to pharmacy. 

## 2012-06-06 NOTE — Telephone Encounter (Signed)
Faxed refill requests from [facility] [address].

## 2012-06-21 ENCOUNTER — Telehealth: Payer: Self-pay | Admitting: Pulmonary Disease

## 2012-06-21 ENCOUNTER — Other Ambulatory Visit: Payer: Self-pay | Admitting: *Deleted

## 2012-06-21 MED ORDER — ZOLPIDEM TARTRATE 10 MG PO TABS: 10.0000 mg | ORAL_TABLET | Freq: Every day | ORAL | Status: DC

## 2012-06-21 NOTE — Telephone Encounter (Signed)
Medicine called to pharmacy. 

## 2012-06-21 NOTE — Telephone Encounter (Signed)
I spoke with spouse and she stated pt is on oxygen 24/7. The symbicort is very expensive over $100. Pt states he used the symbicort to help with the cough. He no longer has a cough now. Wants to know if he can stop this or changed to something more cheaper. He has his emergency inhaler that he has never used. Please advise Dr. Kendrick Fries. thanks

## 2012-06-22 NOTE — Telephone Encounter (Signed)
I spoke with spouse and stated pt wants to see Dr. Kendrick Fries and [facility] and not sure where we got pt did not want to see him (according to phone note in April). Spouse just refilled his symbicort and has enough for 1 month supply. Pt is scheduled to see BQ 07/20/12 at 2:15 for follow up. Nothing further was needed

## 2012-06-22 NOTE — Telephone Encounter (Signed)
I thought he fired me. Multiple no shows. He needs to be seen either by [place] or his PCP.

## 2012-06-26 ENCOUNTER — Other Ambulatory Visit (HOSPITAL_COMMUNITY): Payer: Self-pay | Admitting: Family Medicine

## 2012-07-03 ENCOUNTER — Other Ambulatory Visit: Payer: Self-pay | Admitting: *Deleted

## 2012-07-03 MED ORDER — ALPRAZOLAM 0.25 MG PO TABS: 0.2500 mg | ORAL_TABLET | Freq: Three times a day (TID) | ORAL | Status: DC | PRN

## 2012-07-03 NOTE — Telephone Encounter (Signed)
Medicine called to pharmacy. 

## 2012-07-12 ENCOUNTER — Ambulatory Visit (INDEPENDENT_AMBULATORY_CARE_PROVIDER_SITE_OTHER): Payer: Medicare Other | Admitting: Family Medicine

## 2012-07-12 ENCOUNTER — Ambulatory Visit: Payer: Medicare Other | Admitting: Family Medicine

## 2012-07-12 ENCOUNTER — Encounter: Payer: Self-pay | Admitting: Family Medicine

## 2012-07-12 VITALS — BP 150/88 | HR 72 | Temp 97.8°F | Wt 262.0 lb

## 2012-07-12 DIAGNOSIS — Z23 Encounter for immunization: Secondary | ICD-10-CM

## 2012-07-12 DIAGNOSIS — H6120 Impacted cerumen, unspecified ear: Secondary | ICD-10-CM

## 2012-07-12 NOTE — Addendum Note (Signed)
Addended by: Eliezer Bottom on: 07/12/2012 01:11 PM   Modules accepted: Orders
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
## 2012-07-12 NOTE — Patient Instructions (Addendum)
Good to see you. Try the debrox drops over the counter for the remaining wax in right ear.

## 2012-07-19 ENCOUNTER — Other Ambulatory Visit: Payer: Self-pay | Admitting: *Deleted

## 2012-07-19 MED ORDER — CITALOPRAM HYDROBROMIDE 10 MG PO TABS: 10.0000 mg | ORAL_TABLET | Freq: Every day | ORAL | Status: DC

## 2012-07-19 MED ORDER — ALPRAZOLAM 0.25 MG PO TABS: 0.2500 mg | ORAL_TABLET | Freq: Three times a day (TID) | ORAL | Status: DC | PRN

## 2012-07-19 MED ORDER — ZOLPIDEM TARTRATE 10 MG PO TABS: 10.0000 mg | ORAL_TABLET | Freq: Every day | ORAL | Status: DC

## 2012-07-19 NOTE — Telephone Encounter (Signed)
Medicines called to [facility].

## 2012-07-19 NOTE — Telephone Encounter (Signed)
Fax from mail order pharmacy requesting 90 day rx's

## 2012-07-20 ENCOUNTER — Other Ambulatory Visit: Payer: Self-pay | Admitting: Family Medicine

## 2012-07-20 ENCOUNTER — Encounter: Payer: Self-pay | Admitting: Pulmonary Disease

## 2012-07-20 ENCOUNTER — Ambulatory Visit (INDEPENDENT_AMBULATORY_CARE_PROVIDER_SITE_OTHER): Payer: Medicare Other | Admitting: Pulmonary Disease

## 2012-07-20 VITALS — BP 132/80 | HR 74 | Temp 97.9°F | Ht 72.0 in | Wt 262.0 lb

## 2012-07-20 DIAGNOSIS — K922 Gastrointestinal hemorrhage, unspecified: Secondary | ICD-10-CM

## 2012-07-20 DIAGNOSIS — J449 Chronic obstructive pulmonary disease, unspecified: Secondary | ICD-10-CM

## 2012-07-20 MED ORDER — BUDESONIDE-FORMOTEROL FUMARATE 160-4.5 MCG/ACT IN AERO: 2.0000 | INHALATION_SPRAY | Freq: Two times a day (BID) | RESPIRATORY_TRACT | Status: DC

## 2012-07-20 NOTE — Telephone Encounter (Signed)
Pt is asking that refill on ambien be called to [facility].  A months supply was sent to [facility] pharmacy yesterday but patient says he is almost out of the medicine. Please advise.

## 2012-07-20 NOTE — Telephone Encounter (Signed)
[facility] 

## 2012-07-20 NOTE — Assessment & Plan Note (Signed)
This has been a stable interval in terms of his COPD with the exception of the addition of oxygen.  I suspect that he needed this acutely in the setting of bleeding.    Plan: -continue oxygen for now and will re-assess on next visit -flu shot today -encouraged exercise -continue symbicort

## 2012-07-20 NOTE — Assessment & Plan Note (Signed)
Continue to follow up with [name]

## 2012-07-20 NOTE — Progress Notes (Signed)
Subjective:    Patient ID: [name], male    DOB: October 26, 1940, 71 y.o.   MRN: [id number]  Synopsis: Severe COPD per FEV1 44% in 2011.  Quit smoking in early 2011 after long smoking history.  Started on O2 in 2013 with chronic GI bleeding.  HPI  03/30/2011 H&P ([place]) -- Severe COPD:  29 yowm former patient of Dr Shelle Iron last seen approx one year ago for COPD/emphysema. He has been tried on multiple inhaled medications including Advair, Symbicort and Spiriva all with no discernible benefit.  Reportedly told by Dr Shelle Iron that there was nothing more that can be done. Presents again for "second opinion" and reports continued severe DOE (class III symptoms). He has episodes of panic assoc with the dyspnea and has been prescribed Xanax by his primary MD.  Has stopped smoking since last seen by Dr Shelle Iron (quit 5 mos ago). Denies exertional CP but does have occasional epigastric pain. No history of hemoptysis, LE edema or calf tenderness. Intermittent cough is mostly NP.  09/13/11 ROV Severe COPD:  Returns today, "to have you refill my symbicort and albuterol".  He says that these meds have made a big difference in his shortness of breath.  He still uses albuterol about twice per week for dyspnea, mostly from exertion.  He does not have environmental triggers.  He has a rare cough productive of scant clear sputum.  He has not started to exercise and is adamant that there is no benefit to pulmonary rehab.  He has gained weight since our last visit.  07/20/12 ROV -- Mr. Raterman has had a rough several months with recurrent GI bleeding. Apparently the bleeding has been localized to the small bowel. He states that his most recent hemoglobin was 12 which is as high as it's been. He was hospitalized 2 is not 3 times in the last several months for GI bleeding. During one of these hospitalizations he was started on 2.5 liters of oxygen continuously. He says that when he is at rest he doesn't feel short of  breath but he needs the oxygen when he gets up and moves around. He has not had cough. He continues to be compliant with his Symbicort which he says helps quite a bit. He says that his acid reflux is well controlled especially now during his hospital bed where he can probably that of his bed.   Past Medical History  Diagnosis Date  . Allergic rhinitis, cause unspecified   . Other and unspecified hyperlipidemia   . 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 Unspecified essential hypertension   . 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 Severe bullous disease of LUL. Prolonged intubation after surgeries due to COPD  . 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 Anemia     Recurrent microcytic, presumably GI   . Complication of anesthesia     trouble getting off ventilator     Review of Systems  Constitutional: Negative for fever, chills, activity change, appetite change and unexpected weight change.  HENT: Negative for congestion, rhinorrhea, sneezing and postnasal drip.   Respiratory: Positive for shortness of breath. Negative for cough and choking.   Cardiovascular: Negative for chest pain and leg swelling.       Objective:   Physical Exam   Filed Vitals:   07/20/12  1434  BP: 132/80  Pulse: 74  Temp: 97.9 F (36.6 C)  TempSrc: Oral  Height: 6' (1.829 m)  Weight: 262 lb (118.842 kg)  SpO2: 94%  O2 2.5 L  Gen: chronically ill appearing, no acute distress HEENT: NCAT, PERRL, EOMi, OP clear, neck supple without masses PULM: No wheeze, few crackles in bases CV: RRR, no mgr, no JVD AB: BS+, soft, nontender, no hsm Ext: warm, trace edema, no clubbing, no cyanosis  2012 simple spirometry with FEV1 of 44% predicted clear obstruction and flow volume loop     Assessment & Plan:   COPD (chronic obstructive pulmonary  disease) This has been a stable interval in terms of his COPD with the exception of the addition of oxygen.  I suspect that he needed this acutely in the setting of bleeding.    Plan: -continue oxygen for now and will re-assess on next visit -flu shot today -encouraged exercise -continue symbicort  Chronic GI bleeding Continue to follow up with [name]    Updated Medication List Outpatient Encounter Prescriptions as of 07/20/2012  Medication Sig Dispense Refill  . ALPRAZolam (XANAX) 0.25 MG tablet Take 1 tablet (0.25 mg total) by mouth 3 (three) times daily as needed. anxiety  90 tablet  0  . aspirin 81 MG chewable tablet Chew 1 tablet (81 mg total) by mouth daily. Resume when it is okay by [name]      . budesonide-formoterol (SYMBICORT) 160-4.5 MCG/ACT inhaler Inhale 2 puffs into the lungs 2 (two) times daily.      . citalopram (CELEXA) 10 MG tablet Take 1 tablet (10 mg total) by mouth daily.  90 tablet  3  . folic acid (FOLVITE) 1 MG tablet Take 1 tablet (1 mg total) by mouth daily.  30 tablet  0  . HYDROcodone-acetaminophen (VICODIN) 5-500 MG per tablet Take 1 tablet by mouth every 4 (four) hours as needed. pain  30 tablet  0  . iron polysaccharides (POLYSACCHARIDE IRON) 150 MG capsule Take 1 capsule (150 mg total) by mouth 2 (two) times daily.  60 capsule  0  . metFORMIN (GLUCOPHAGE) 500 MG tablet Take 500 mg by mouth 2 (two) times daily with a meal.      . metoprolol tartrate (LOPRESSOR) 25 MG tablet Take 25 mg by mouth 2 (two) times daily.      . pantoprazole (PROTONIX) 40 MG tablet       . PROVENTIL HFA 108 (90 BASE) MCG/ACT inhaler Inhale 2 puffs into the lungs every 4 (four) hours as needed. Shortness of breath      . zolpidem (AMBIEN) 10 MG tablet Take 1 tablet (10 mg total) by mouth at bedtime.  30 tablet  0  . DISCONTD: pantoprazole (PROTONIX) 40 MG tablet TAKE 1 TABLET (40 MG TOTAL) BY MOUTH 2 (TWO) TIMES DAILY.  60 tablet  6  . DISCONTD: rosuvastatin (CRESTOR) 40 MG  tablet Take 40 mg by mouth daily.

## 2012-07-20 NOTE — Patient Instructions (Signed)
Keep taking the symbicort as you are doing Start exercising regularly, start with 5 minutes of walking daily, increase by 1 minute a day a week Keep using your oxygen as you are doing We will see you back in 3 months or sooner if needed

## 2012-07-23 ENCOUNTER — Other Ambulatory Visit: Payer: Self-pay

## 2012-07-23 MED ORDER — ZOLPIDEM TARTRATE 10 MG PO TABS: 10.0000 mg | ORAL_TABLET | Freq: Every evening | ORAL | Status: DC | PRN

## 2012-07-23 NOTE — Telephone Encounter (Signed)
pts wife said pt out of ambien while waiting on mail order rx to come. [facility] could not fill # 30 called in on 07/20/12. Spoke with [place] at [facility] she cancelled rx called in 07/20/12 for # 30 and changed to # 10 x 0. Pts wife advised while on phone.

## 2012-08-08 ENCOUNTER — Telehealth: Payer: Self-pay | Admitting: Pulmonary Disease

## 2012-08-08 ENCOUNTER — Other Ambulatory Visit: Payer: Self-pay

## 2012-08-08 MED ORDER — BUDESONIDE-FORMOTEROL FUMARATE 160-4.5 MCG/ACT IN AERO: 2.0000 | INHALATION_SPRAY | Freq: Two times a day (BID) | RESPIRATORY_TRACT | Status: DC

## 2012-08-08 NOTE — Telephone Encounter (Signed)
Rx for symbicort was refilled  

## 2012-08-08 NOTE — Telephone Encounter (Signed)
[facility] faxed refill request lipitor  No strength or directions given. Cannot find on med list.Please advise.

## 2012-08-09 MED ORDER — ATORVASTATIN CALCIUM 80 MG PO TABS: 80.0000 mg | ORAL_TABLET | Freq: Every day | ORAL | Status: DC

## 2012-08-09 NOTE — Telephone Encounter (Signed)
Please call pt to find out what he is taking.  We have crestor, not lipitor listed in the chart.

## 2012-08-09 NOTE — Telephone Encounter (Signed)
Pt was given crestor while he was in the hospital, but that was too expensive so Dr. Alanda Amass changed him to atorvastatin 80 mg's daily recently.

## 2012-08-09 NOTE — Telephone Encounter (Signed)
Ok to refill his atorvastatin as he is taking it.  Please update med list.

## 2012-08-27 ENCOUNTER — Encounter: Payer: Self-pay | Admitting: Vascular Surgery

## 2012-08-29 ENCOUNTER — Other Ambulatory Visit: Payer: Self-pay

## 2012-08-29 MED ORDER — ZOLPIDEM TARTRATE 10 MG PO TABS: 10.0000 mg | ORAL_TABLET | Freq: Every evening | ORAL | Status: DC | PRN

## 2012-08-29 NOTE — Telephone Encounter (Signed)
pts wife and pt request refill [facility]ien to [facility] for one month rx. Pt is not going to get [place] thru mail order pharmacy. Pt has enough med to last until 09/01/12.Please advise.

## 2012-08-30 NOTE — Telephone Encounter (Signed)
[facility] 

## 2012-09-04 ENCOUNTER — Other Ambulatory Visit: Payer: Self-pay | Admitting: Family Medicine

## 2012-09-05 NOTE — Telephone Encounter (Signed)
rx called in

## 2012-09-21 ENCOUNTER — Other Ambulatory Visit: Payer: Self-pay | Admitting: *Deleted

## 2012-09-21 DIAGNOSIS — Z48812 Encounter for surgical aftercare following surgery on the circulatory system: Secondary | ICD-10-CM

## 2012-09-21 DIAGNOSIS — I714 Abdominal aortic aneurysm, without rupture: Secondary | ICD-10-CM

## 2012-09-24 ENCOUNTER — Ambulatory Visit: Payer: Medicare Other | Admitting: Neurosurgery

## 2012-09-27 ENCOUNTER — Other Ambulatory Visit: Payer: Self-pay | Admitting: Family Medicine

## 2012-09-27 NOTE — Telephone Encounter (Signed)
[facility] 

## 2012-10-19 ENCOUNTER — Encounter: Payer: Self-pay | Admitting: Neurosurgery

## 2012-10-22 ENCOUNTER — Ambulatory Visit (INDEPENDENT_AMBULATORY_CARE_PROVIDER_SITE_OTHER): Payer: PRIVATE HEALTH INSURANCE | Admitting: Vascular Surgery

## 2012-10-22 ENCOUNTER — Ambulatory Visit (INDEPENDENT_AMBULATORY_CARE_PROVIDER_SITE_OTHER): Payer: PRIVATE HEALTH INSURANCE | Admitting: Neurosurgery

## 2012-10-22 ENCOUNTER — Encounter: Payer: Self-pay | Admitting: Neurosurgery

## 2012-10-22 VITALS — BP 129/83 | HR 72 | Resp 18 | Ht 75.0 in | Wt 267.0 lb

## 2012-10-22 DIAGNOSIS — I714 Abdominal aortic aneurysm, without rupture, unspecified: Secondary | ICD-10-CM | POA: Insufficient documentation

## 2012-10-22 DIAGNOSIS — Z48812 Encounter for surgical aftercare following surgery on the circulatory system: Secondary | ICD-10-CM

## 2012-10-22 NOTE — Progress Notes (Signed)
Abdominal aorta duplex performed @ [facility] 10/22/2012

## 2012-10-22 NOTE — Progress Notes (Signed)
[facility]  AAA/Carotid Office Note  CC: AAA endograft surveillance Referring Physician: Brabham  History of Present Illness: 72 year old male patient of Dr. Myra Gianotti status post endovascular aortic repair in March 2009. The patient denies any unusual abdominal or back pain and has no signs of claudication or rest pain. The patient states he was hospitalized at the end of 2013 on 3 different occasions for GI bleeding and was attended by [name], the patient reports no further difficulties with this since his last discharge.  Past Medical History  Diagnosis Date  . Allergic rhinitis, cause unspecified   . Other and unspecified hyperlipidemia   . 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 Unspecified essential hypertension   . 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 Severe bullous disease of LUL. Prolonged intubation after surgeries due to COPD  . 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 Anemia     Recurrent microcytic, presumably GI   . Complication of anesthesia     trouble getting off ventilator    ROS: [x]  Positive   [ ]  Denies    General: [ ]  Weight loss, [ ]  Fever, [ ]  chills Neurologic: [x ] Dizziness, [ ]  Blackouts, [ ]  Seizure [ ]  Stroke, [ ]  "Mini stroke", [ ]  Slurred speech, [ ]  Temporary blindness; [x ] weakness in arms or legs, [ ]  Hoarseness Cardiac: [ x] Chest pain/pressure, [ x] Shortness of breath at rest [ x] Shortness of breath with exertion, [ ]  Atrial fibrillation or irregular heartbeat Vascular: [ ]  Pain in legs with walking, [x ] Pain in legs at rest, [ ]  Pain in legs at night,  [ ]  Non-healing ulcer, [ ]  Blood clot in vein/DVT,   Pulmonary: [ ]  Home oxygen, [ ]  Productive cough, [ ]  Coughing up blood, [ ]  Asthma,  [x ] Wheezing Musculoskeletal:  [ ]  Arthritis, [ ]  Low  back pain, [ ]  Joint pain Hematologic: [ ]  Easy Bruising, [ ]  Anemia; [ ]  Hepatitis Gastrointestinal: [ ]  Blood in stool, [ ]  Gastroesophageal Reflux/heartburn, [ ]  Trouble swallowing Urinary: [ ]  chronic Kidney disease, [ ]  on HD - [ ]  MWF or [ ]  TTHS, [ ]  Burning with urination, [ ]  Difficulty urinating Skin: [ x] Rashes, [ ]  Wounds Psychological: [ ]  Anxiety, [ x] Depression   Social History History  Substance Use Topics  . Smoking status: Former Smoker -- 2.0 packs/day for 50 years    Types: Cigarettes    Quit date: 11/18/2009  . Smokeless tobacco: Never Used  . Alcohol Use: No    Family History Family History  Problem Relation Age of Onset  . Emphysema Mother   . Heart disease Mother   . ALS Father     Allergies  Allergen Reactions  . Penicillins Anaphylaxis and Hives  . Demerol (Meperidine) Other (See Comments)    hallucinations  . Morphine And Related Nausea Only    Current Outpatient Prescriptions  Medication Sig Dispense Refill  . ALPRAZolam (XANAX) 0.25 MG tablet Take 1 tablet (0.25 mg total) by mouth 3 (three) times daily as needed. anxiety  90 tablet  0  . aspirin 81 MG chewable tablet Chew 1 tablet (81 mg total) by mouth daily. Resume when it is okay by [name]      .  atorvastatin (LIPITOR) 80 MG tablet Take 1 tablet (80 mg total) by mouth daily.  90 tablet  2  . budesonide-formoterol (SYMBICORT) 160-4.5 MCG/ACT inhaler Inhale 2 puffs into the lungs 2 (two) times daily.  1 Inhaler  6  . citalopram (CELEXA) 10 MG tablet Take 1 tablet (10 mg total) by mouth daily.  90 tablet  3  . folic acid (FOLVITE) 1 MG tablet Take 1 tablet (1 mg total) by mouth daily.  30 tablet  0  . HYDROcodone-acetaminophen (VICODIN) 5-500 MG per tablet TAKE 1 TABLET BY MOUTH EVERY 4 HOURS AS NEEDED  90 tablet  0  . iron polysaccharides (POLYSACCHARIDE IRON) 150 MG capsule Take 1 capsule (150 mg total) by mouth 2 (two) times daily.  60 capsule  0  . metFORMIN (GLUCOPHAGE) 500 MG tablet  Take 500 mg by mouth 2 (two) times daily with a meal.      . metoprolol tartrate (LOPRESSOR) 25 MG tablet Take 25 mg by mouth 2 (two) times daily.      . pantoprazole (PROTONIX) 40 MG tablet       . PROVENTIL HFA 108 (90 BASE) MCG/ACT inhaler Inhale 2 puffs into the lungs every 4 (four) hours as needed. Shortness of breath      . zolpidem (AMBIEN) 10 MG tablet TAKE 1 TABLET BY MOUTH AT BEDTIME  30 tablet  0    Physical Examination  Filed Vitals:   10/22/12 1111  BP: 129/83  Pulse: 72  Resp: 18    Body mass index is 33.37 kg/(m^2).  General:  WDWN in NAD Gait: Normal HEENT: WNL Eyes: Pupils equal Pulmonary: normal non-labored breathing , without Rales, rhonchi,  wheezing Cardiac: RRR, without  Murmurs, rubs or gallops; Abdomen: soft, NT, no masses Skin: no rashes, ulcers noted  Vascular Exam Pulses: Palpable femoral pulses bilaterally, lower extremity pulses are not palpated due to the edema, there is no abdominal mass palpated Carotid bruits: Carotid pulses to auscultation no bruits are heard Extremities without ischemic changes, no Gangrene , no cellulitis; no open wounds;  Musculoskeletal: no muscle wasting or atrophy   Neurologic: A&O X 3; Appropriate Affect ; SENSATION: normal; MOTOR FUNCTION:  moving all extremities equally. Speech is fluent/normal  Non-Invasive Vascular Imaging AAA duplex shows a maximum diameter of 4.1 x 4.1 which is down from July 2012 when he was 4.6 x 4.8   ASSESSMENT/PLAN: Asymptomatic patient that will followup in one year with repeat aortoiliac duplex, his questions were encouraged and answered, he is in agreement with this plan.  Lauree Chandler ANP   Clinic MD: [facility]

## 2012-10-26 ENCOUNTER — Other Ambulatory Visit: Payer: Self-pay

## 2012-10-26 MED ORDER — HYDROCODONE-ACETAMINOPHEN 5-500 MG PO TABS: 1.0000 | ORAL_TABLET | ORAL | Status: DC | PRN

## 2012-10-26 MED ORDER — ZOLPIDEM TARTRATE 10 MG PO TABS: 10.0000 mg | ORAL_TABLET | Freq: Every evening | ORAL | Status: DC | PRN

## 2012-10-26 NOTE — Telephone Encounter (Signed)
pts wife request refills on Hydrocodone apap and zolpidem to [facility]. Pt does not need other refills at this time but wants to [facility].

## 2012-10-26 NOTE — Telephone Encounter (Signed)
Medicines [facility].

## 2012-10-29 IMAGING — CR DG CHEST 2V
3 series · 3 of 3 positions shown · non-contrast
Comparison: 01/27/2010

CLINICAL DATA: Shortness of breath

CHEST - 2 VIEW

[w chest lat (1 of 2)]
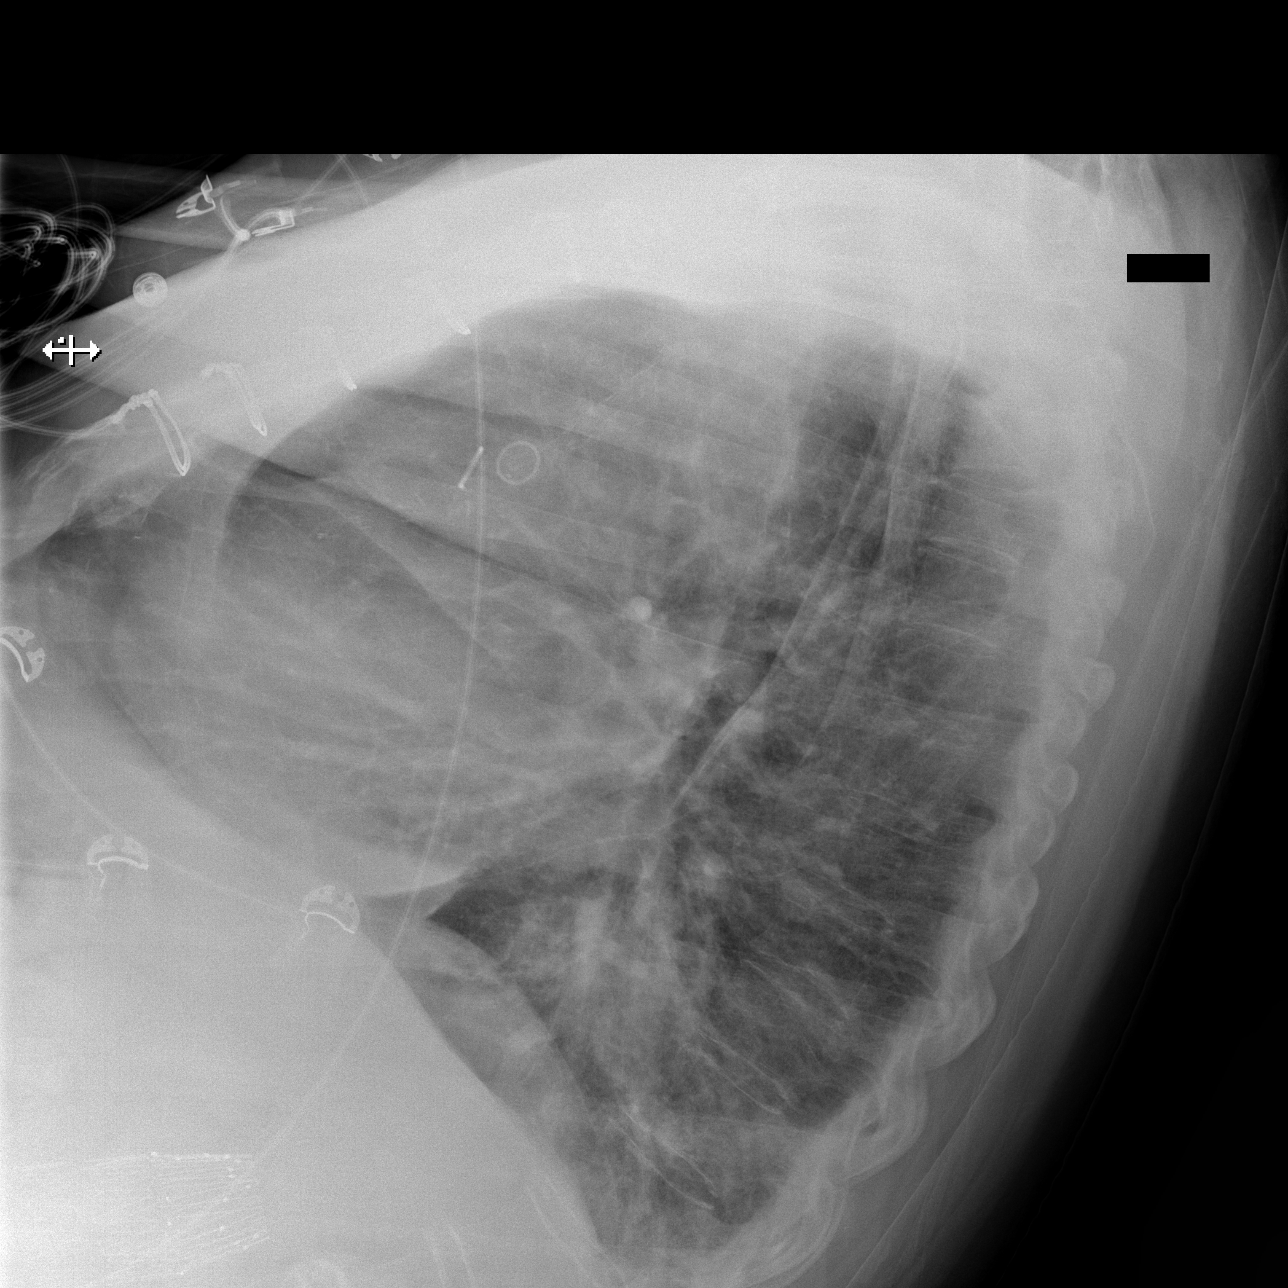

[w chest lat (2 of 2)]
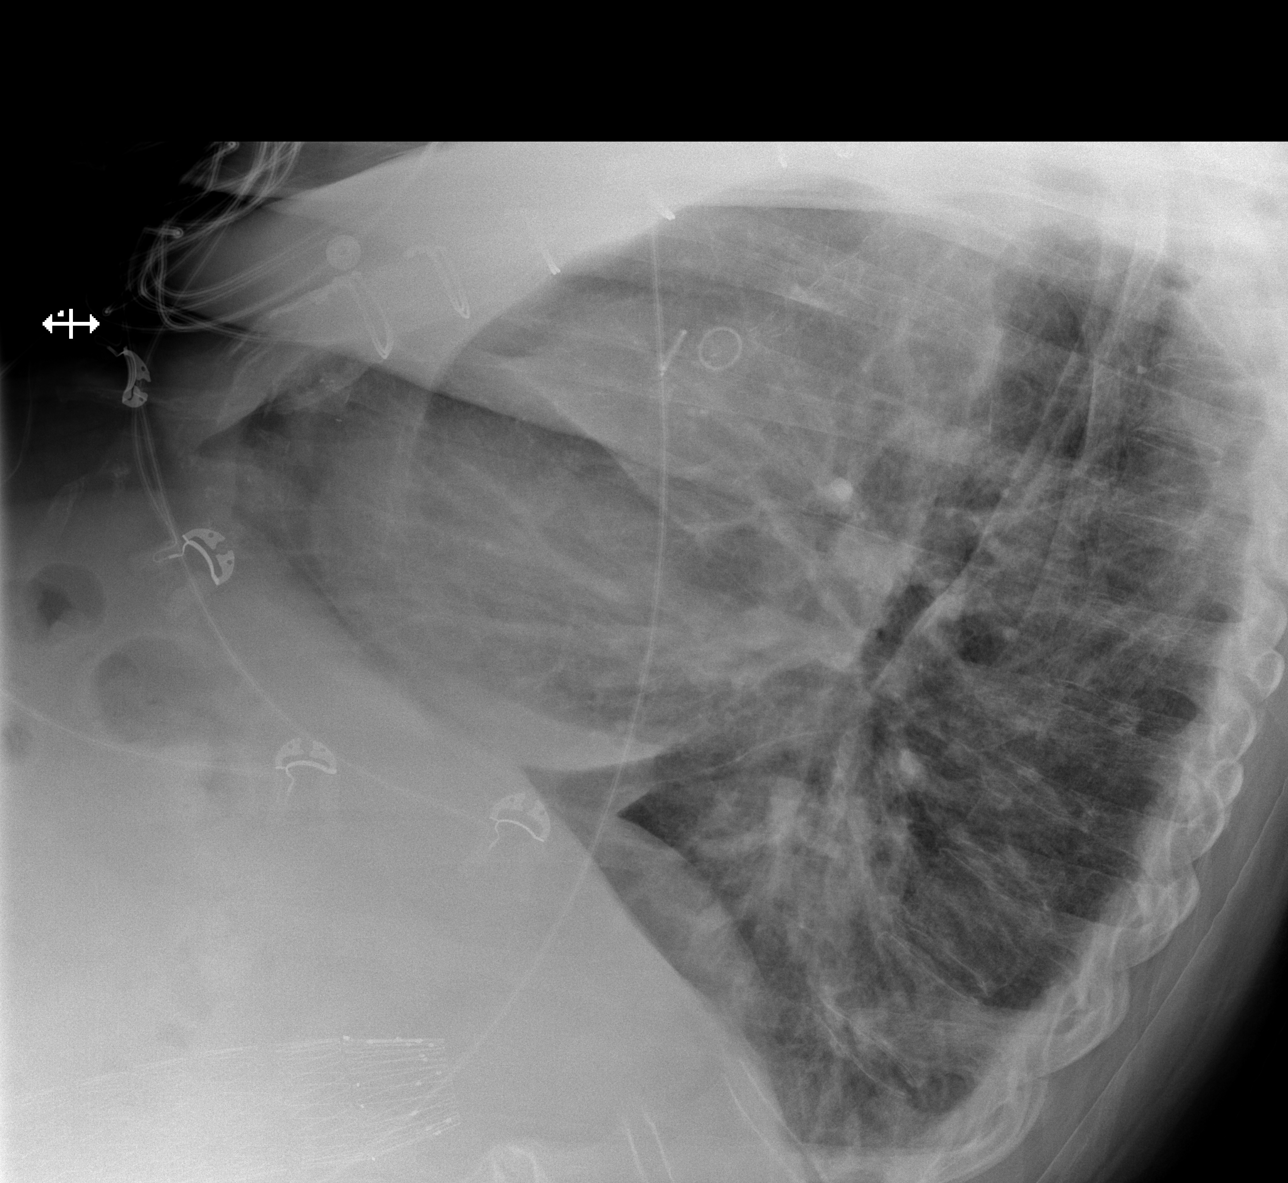

[x chest ap]
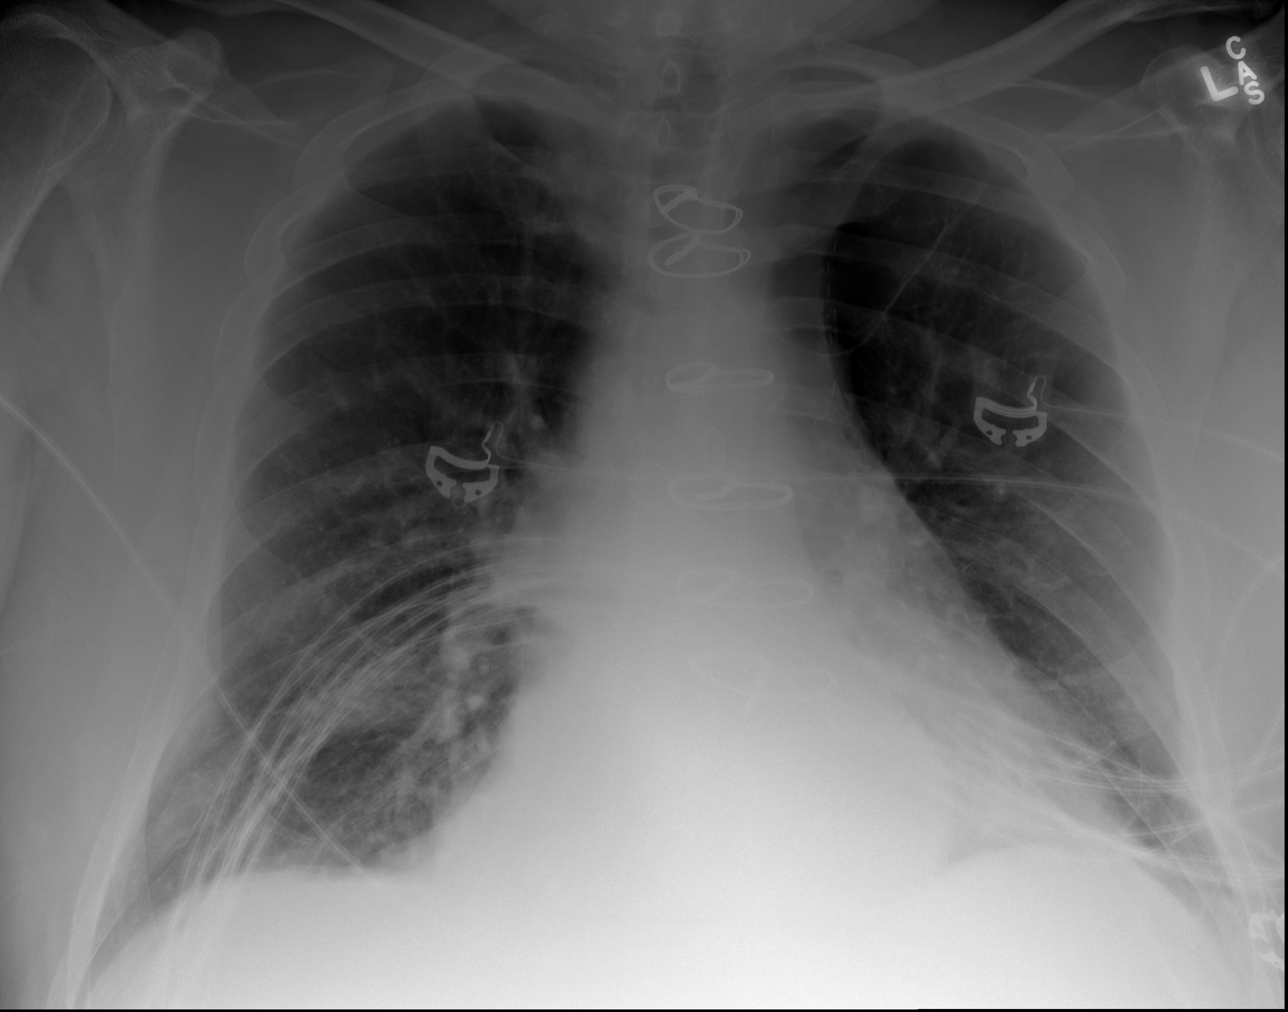

[3 of 3 positions shown; findings below may reference images not displayed]

FINDINGS: Due to patient body habitus, suboptimal x-ray exposure
which limits evaluation for subtle abnormality.  Cardiomegaly.
Central vascular congestion.  Status post median sternotomy and
CABG.  Bullous changes of the left lung apex. Bronchitic changes.
Bibasilar opacities.  Large hiatal hernia.  Osteopenia.  Remote
posterior right rib fracture.
IMPRESSION: Cardiomegaly with central vascular congestion.

COPD/bronchitic changes.

Bibasilar opacities; atelectasis versus infiltrate.

## 2012-11-01 ENCOUNTER — Other Ambulatory Visit: Payer: Self-pay | Admitting: Family Medicine

## 2012-11-01 ENCOUNTER — Other Ambulatory Visit: Payer: Self-pay

## 2012-11-01 MED ORDER — CITALOPRAM HYDROBROMIDE 10 MG PO TABS: 10.0000 mg | ORAL_TABLET | Freq: Every day | ORAL | Status: DC

## 2012-11-01 MED ORDER — METFORMIN HCL 500 MG PO TABS: 500.0000 mg | ORAL_TABLET | Freq: Two times a day (BID) | ORAL | Status: DC

## 2012-11-01 MED ORDER — PANTOPRAZOLE SODIUM 40 MG PO TBEC: 40.0000 mg | DELAYED_RELEASE_TABLET | Freq: Every day | ORAL | Status: DC

## 2012-11-01 NOTE — Telephone Encounter (Signed)
No I do not feel comfortable increasing dose of protonix.

## 2012-11-01 NOTE — Telephone Encounter (Signed)
Advised pt's wife, she said she will go back to giving patient one pantroprazole per day.

## 2012-11-01 NOTE — Telephone Encounter (Signed)
pts wife request 2 week refill to [facility] while waiting on mail order pharmacy for metformin # 28 x 0 ; that refill done. pts wife request pantoprazole 40 mg to be increased to twice daily; pt has been taking 2 a day and is doing well.Please advise. Pts wife said would call and schedule appt with Dr Dayton Martes in next 3 months.

## 2012-11-01 NOTE — Telephone Encounter (Signed)
Can pt's pantoprazole script be increased to 2 per day?  Please see note below.

## 2012-11-01 NOTE — Telephone Encounter (Signed)
pts [facility]).Advised pts wife done. Pt's wife said will call and get appt for pt in next 3 months.

## 2012-11-06 ENCOUNTER — Ambulatory Visit (INDEPENDENT_AMBULATORY_CARE_PROVIDER_SITE_OTHER): Payer: Medicare Other | Admitting: Pulmonary Disease

## 2012-11-06 ENCOUNTER — Telehealth: Payer: Self-pay | Admitting: Pulmonary Disease

## 2012-11-06 ENCOUNTER — Encounter: Payer: Self-pay | Admitting: Pulmonary Disease

## 2012-11-06 ENCOUNTER — Other Ambulatory Visit: Payer: Medicare Other

## 2012-11-06 ENCOUNTER — Telehealth: Payer: Self-pay | Admitting: *Deleted

## 2012-11-06 ENCOUNTER — Ambulatory Visit (INDEPENDENT_AMBULATORY_CARE_PROVIDER_SITE_OTHER)
Admission: RE | Admit: 2012-11-06 | Discharge: 2012-11-06 | Disposition: A | Discharge status: Home / Self Care | Payer: Medicare Other | Source: Ambulatory Visit | Attending: Pulmonary Disease | Admitting: Pulmonary Disease

## 2012-11-06 VITALS — BP 124/80 | HR 85 | Temp 98.0°F | Ht 75.0 in | Wt 265.0 lb

## 2012-11-06 DIAGNOSIS — I5030 Unspecified diastolic (congestive) heart failure: Secondary | ICD-10-CM

## 2012-11-06 DIAGNOSIS — J449 Chronic obstructive pulmonary disease, unspecified: Secondary | ICD-10-CM

## 2012-11-06 DIAGNOSIS — R5383 Other fatigue: Secondary | ICD-10-CM

## 2012-11-06 DIAGNOSIS — I219 Acute myocardial infarction, unspecified: Secondary | ICD-10-CM

## 2012-11-06 DIAGNOSIS — R21 Rash and other nonspecific skin eruption: Secondary | ICD-10-CM | POA: Insufficient documentation

## 2012-11-06 DIAGNOSIS — J4489 Other specified chronic obstructive pulmonary disease: Secondary | ICD-10-CM

## 2012-11-06 NOTE — Telephone Encounter (Signed)
Pt would like to get zostavax.  Ok to schedule?

## 2012-11-06 NOTE — Assessment & Plan Note (Signed)
[name] describes worsening fatigue and dyspnea on exertion.  See discussion of fatigue below.  I explained to him that likely his dyspnea is coming from deconditioning and obesity contributing to his positional dyspnea (ie. Whenever he bends over).    His lung function testing today has declined, but not significantly to the point that he needs a new inhaler.   He desperately needs pulmonary rehab to help with his deconditioning, but despite my best attempt to convince him (in a 25+ minute office visit during which time he and I were conversing directly) he continues to refuse to go.  Plan: -For now we will keep the Symbicort alone -CXR -continue O2 as written

## 2012-11-06 NOTE — Telephone Encounter (Signed)
I called Mr. Kalter to let him know that his CXR did not show an acute abnormality but that he has a large hiatal hernia (which we already knew).  This likely contributes to his dyspnea when he bends over.  The main treatment would be weight loss and exercise at this point.

## 2012-11-06 NOTE — Assessment & Plan Note (Signed)
He has multiple erythematous, non-rased macules on his arm with itch profusely.  I offered a dermatology referral but he declined.

## 2012-11-06 NOTE — Patient Instructions (Signed)
We will call you with the results of your blood test and your Chest X-ray and let you know if there is anything else to do We will call you with the results of your sleep study You need to be seen by your cardiologist again to evaluate your chest pain Try to exercise on a regular basis.  If you change your mind about pulmonary rehab let us know We will see you back in 3 months or sooner if needed

## 2012-11-06 NOTE — Assessment & Plan Note (Addendum)
DDx here includes deconditioning, recurrent anemia, worsening coronary disease or heart failure, and obstructive sleep apnea.  He is reluctant to undergo further testing but I tried to explain to him at length why he needs this.  He said in the office that he thought that amphetamines or cocaine would give him more energy.  I explicitly instructed him to not use these given his known coronary disease.  Plan: -home sleep study at night -check cbc today -refer back to cardiologist

## 2012-11-06 NOTE — Telephone Encounter (Signed)
Yes ok to schedule.

## 2012-11-06 NOTE — Assessment & Plan Note (Signed)
He has been experiencing intermittent chest pain lately and needs to see his cardiologist again.  We discussed this at length.

## 2012-11-06 NOTE — Progress Notes (Signed)
Subjective:    Patient ID: [name], male    DOB: 02-07-1941, 72 y.o.   MRN: [id number]  Synopsis: Severe COPD per FEV1 44% in 2011.  Quit smoking in early 2011 after long smoking history.  Started on O2 in 2013 with chronic GI bleeding.  HPI  03/30/2011 H&P ([facility]) -- Severe COPD:  49 yowm former patient of Dr Shelle Iron last seen approx one year ago for COPD/emphysema. He has been tried on multiple inhaled medications including Advair, Symbicort and Spiriva all with no discernible benefit.  Reportedly told by Dr Shelle Iron that there was nothing more that can be done. Presents again for "second opinion" and reports continued severe DOE (class III symptoms). He has episodes of panic assoc with the dyspnea and has been prescribed Xanax by his primary MD.  Has stopped smoking since last seen by Dr Shelle Iron (quit 5 mos ago). Denies exertional CP but does have occasional epigastric pain. No history of hemoptysis, LE edema or calf tenderness. Intermittent cough is mostly NP.  09/13/11 ROV Severe COPD:  Returns today, "to have you refill my symbicort and albuterol".  He says that these meds have made a big difference in his shortness of breath.  He still uses albuterol about twice per week for dyspnea, mostly from exertion.  He does not have environmental triggers.  He has a rare cough productive of scant clear sputum.  He has not started to exercise and is adamant that there is no benefit to pulmonary rehab.  He has gained weight since our last visit.  07/20/12 ROV -- [name] has had a rough several months with recurrent GI bleeding. Apparently the bleeding has been localized to the small bowel. He states that his most recent hemoglobin was 12 which is as high as it's been. He was hospitalized 2 is not 3 times in the last several months for GI bleeding. During one of these hospitalizations he was started on 2.5 liters of oxygen continuously. He says that when he is at rest he doesn't feel short of  breath but he needs the oxygen when he gets up and moves around. He has not had cough. He continues to be compliant with his Symbicort which he says helps quite a bit. He says that his acid reflux is well controlled especially now during his hospital bed where he can probably that of his bed.  11/06/2012 ROV -- [name] has been feeling more short of breath lately which he attributes to a slow recovery from his bleeding last summer.  He has not noticed more blood in his stool or dark stools.  He is not exercising regularly because he doesn't have the energy for it.  He refuses pulmonary rehab.  He states that he is more short of breath when he bends over and sometimes when he lies back in bed.  No new change in leg swelling.  He continues to have intermittent chest pain which lasts for a few seconds to 2-3 minutes and then passes.  This occurs at rest and with exertion. He does not have a new cough or sinus symptoms.  He continues to use and benefit from his home oxygen.    Past Medical History  Diagnosis Date  . Allergic rhinitis, cause unspecified   . Other and unspecified hyperlipidemia   . 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 Unspecified essential hypertension   . COPD (chronic obstructive pulmonary disease)  GOLD stage IV, started home O2. Severe bullous disease of LUL. Prolonged intubation after surgeries due to COPD  . 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 Anemia     Recurrent microcytic, presumably GI   . Complication of anesthesia     trouble getting off ventilator     Review of Systems  Constitutional: Negative for fever, chills, activity change, appetite change and unexpected weight change.  HENT: Negative for congestion, rhinorrhea, sneezing and postnasal drip.   Respiratory: Positive for shortness of breath. Negative for cough and choking.     Cardiovascular: Positive for chest pain. Negative for leg swelling.       Objective:   Physical Exam   Filed Vitals:   11/06/12 1158  BP: 124/80  Pulse: 85  Temp: 98 F (36.7 C)  TempSrc: Oral  Height: 6\' 3"  (1.905 m)  Weight: 265 lb (120.203 kg)  SpO2: 97%  O2 2.5 L  Gen: chronically ill appearing, no acute distress HEENT: NCAT, PERRL, EOMi, OP clear, neck supple without masses PULM: No wheeze, crackles in bases more than before CV: RRR, no mgr, no JVD AB: BS+, soft, nontender, no hsm Ext: warm, trace edema, no clubbing, no cyanosis Derm: macular, erythematous, keratotic rash R arm  2010 simple spirometry with FEV1 of 1.41L 44% predicted clear obstruction and flow volume loop 2014 simple spirometry FEV1 1.3 L, clear obstruction     Assessment & Plan:   COPD (chronic obstructive pulmonary disease) [name] describes worsening fatigue and dyspnea on exertion.  See discussion of fatigue below.  I explained to him that likely his dyspnea is coming from deconditioning and obesity contributing to his positional dyspnea (ie. Whenever he bends over).    His lung function testing today has declined, but not significantly to the point that he needs a new inhaler.   He desperately needs pulmonary rehab to help with his deconditioning, but despite my best attempt to convince him (in a 25+ minute office visit during which time he and I were conversing directly) he continues to refuse to go.  Plan: -For now we will keep the Symbicort alone -CXR -continue O2 as written  Diastolic HF (heart failure) He has more shortness of breath and crackles on exam, so we will check a CXR today to look for pulmonary edema.  He does not appear grossly volume up on physical exam today.  He needs to see his cardiologist again to evaluate his chest pain.  MYOCARDIAL INFARCTION He has been experiencing intermittent chest pain lately and needs to see his cardiologist again.  We discussed this  at length.  Fatigue DDx here includes deconditioning, recurrent anemia, worsening coronary disease or heart failure, and obstructive sleep apnea.  He is reluctant to undergo further testing but I tried to explain to him at length why he needs this.  He said in the office that he thought that amphetamines or cocaine would give him more energy.  I explicitly instructed him to not use these given his known coronary disease.  Plan: -home sleep study at night -check cbc today -refer back to cardiologist  Rash He has multiple erythematous, non-rased macules on his arm with itch profusely.  I offered a dermatology referral but he declined.    Updated Medication List Outpatient Encounter Prescriptions as of 11/06/2012  Medication Sig Dispense Refill  . ALPRAZolam (XANAX) 0.25 MG tablet Take 1 tablet (0.25 mg total) by mouth 3 (  three) times daily as needed. anxiety  90 tablet  0  . aspirin 81 MG chewable tablet Chew 1 tablet (81 mg total) by mouth daily. Resume when it is okay by [name]      . atorvastatin (LIPITOR) 80 MG tablet Take 1 tablet (80 mg total) by mouth daily.  90 tablet  2  . budesonide-formoterol (SYMBICORT) 160-4.5 MCG/ACT inhaler Inhale 2 puffs into the lungs 2 (two) times daily.  1 Inhaler  6  . citalopram (CELEXA) 10 MG tablet Take 1 tablet (10 mg total) by mouth daily.  90 tablet  2  . folic acid (FOLVITE) 1 MG tablet Take 1 tablet (1 mg total) by mouth daily.  30 tablet  0  . HYDROcodone-acetaminophen (VICODIN) 5-500 MG per tablet Take 1 tablet by mouth every 4 (four) hours as needed for pain.  90 tablet  0  . iron polysaccharides (POLYSACCHARIDE IRON) 150 MG capsule Take 1 capsule (150 mg total) by mouth 2 (two) times daily.  60 capsule  0  . metFORMIN (GLUCOPHAGE) 500 MG tablet Take 1 tablet (500 mg total) by mouth 2 (two) times daily with a meal.  180 tablet  1  . metoprolol tartrate (LOPRESSOR) 25 MG tablet Take 25 mg by mouth 2 (two) times daily.      . pantoprazole  (PROTONIX) 40 MG tablet Take 40 mg by mouth 2 (two) times daily.      Marland Kitchen PROVENTIL HFA 108 (90 BASE) MCG/ACT inhaler Inhale 2 puffs into the lungs every 4 (four) hours as needed. Shortness of breath      . zolpidem (AMBIEN) 10 MG tablet Take 1 tablet (10 mg total) by mouth at bedtime as needed for sleep.  30 tablet  0  . [DISCONTINUED] pantoprazole (PROTONIX) 40 MG tablet Take 1 tablet (40 mg total) by mouth daily.  14 tablet  0

## 2012-11-06 NOTE — Assessment & Plan Note (Signed)
He has more shortness of breath and crackles on exam, so we will check a CXR today to look for pulmonary edema.  He does not appear grossly volume up on physical exam today.  He needs to see his cardiologist again to evaluate his chest pain.

## 2012-11-08 NOTE — Telephone Encounter (Signed)
Nurse visit scheduled, advised patient.

## 2012-11-12 ENCOUNTER — Other Ambulatory Visit: Payer: Self-pay | Admitting: Pulmonary Disease

## 2012-11-12 ENCOUNTER — Telehealth: Payer: Self-pay | Admitting: Pulmonary Disease

## 2012-11-12 DIAGNOSIS — G4733 Obstructive sleep apnea (adult) (pediatric): Secondary | ICD-10-CM

## 2012-11-12 NOTE — Telephone Encounter (Signed)
Pt aware he can not do home sleep test he will have to go to sleep ctr dr Kendrick Fries will put in an order for this test Tobe Sos

## 2012-11-13 ENCOUNTER — Other Ambulatory Visit: Payer: Self-pay | Admitting: Family Medicine

## 2012-11-14 ENCOUNTER — Ambulatory Visit: Payer: Medicare Other

## 2012-11-20 ENCOUNTER — Ambulatory Visit: Payer: Medicare Other

## 2012-11-23 ENCOUNTER — Other Ambulatory Visit: Payer: Self-pay | Admitting: Family Medicine

## 2012-11-23 NOTE — Telephone Encounter (Signed)
[facility] 

## 2012-11-26 ENCOUNTER — Telehealth: Payer: Self-pay | Admitting: Pulmonary Disease

## 2012-11-26 NOTE — Telephone Encounter (Signed)
[facility] clinic and spoke with Rene Kocher She was able to find one sample of symbicort 160/4.5 and will leave for pt to pick up today Pt made aware

## 2012-12-02 ENCOUNTER — Encounter (HOSPITAL_BASED_OUTPATIENT_CLINIC_OR_DEPARTMENT_OTHER): Payer: Medicare Other

## 2012-12-02 ENCOUNTER — Ambulatory Visit (HOSPITAL_BASED_OUTPATIENT_CLINIC_OR_DEPARTMENT_OTHER): Payer: Medicare Other

## 2012-12-04 ENCOUNTER — Emergency Department (HOSPITAL_COMMUNITY): Payer: Medicare Other

## 2012-12-04 ENCOUNTER — Encounter (HOSPITAL_COMMUNITY): Payer: Self-pay | Admitting: *Deleted

## 2012-12-04 ENCOUNTER — Inpatient Hospital Stay (HOSPITAL_COMMUNITY)
Admission: EM | Admit: 2012-12-04 | Discharge: 2012-12-08 | DRG: 291 | Disposition: A | Discharge status: Home Health | Payer: Medicare Other | Attending: Internal Medicine | Admitting: Internal Medicine

## 2012-12-04 ENCOUNTER — Ambulatory Visit: Payer: Medicare Other | Admitting: Family Medicine

## 2012-12-04 ENCOUNTER — Telehealth: Payer: Self-pay

## 2012-12-04 DIAGNOSIS — K319 Disease of stomach and duodenum, unspecified: Secondary | ICD-10-CM | POA: Diagnosis present

## 2012-12-04 DIAGNOSIS — I10 Essential (primary) hypertension: Secondary | ICD-10-CM

## 2012-12-04 DIAGNOSIS — I509 Heart failure, unspecified: Secondary | ICD-10-CM | POA: Diagnosis present

## 2012-12-04 DIAGNOSIS — I2510 Atherosclerotic heart disease of native coronary artery without angina pectoris: Secondary | ICD-10-CM

## 2012-12-04 DIAGNOSIS — D72829 Elevated white blood cell count, unspecified: Secondary | ICD-10-CM

## 2012-12-04 DIAGNOSIS — D649 Anemia, unspecified: Secondary | ICD-10-CM

## 2012-12-04 DIAGNOSIS — I219 Acute myocardial infarction, unspecified: Secondary | ICD-10-CM

## 2012-12-04 DIAGNOSIS — Z9981 Dependence on supplemental oxygen: Secondary | ICD-10-CM

## 2012-12-04 DIAGNOSIS — I499 Cardiac arrhythmia, unspecified: Secondary | ICD-10-CM

## 2012-12-04 DIAGNOSIS — I714 Abdominal aortic aneurysm, without rupture, unspecified: Secondary | ICD-10-CM

## 2012-12-04 DIAGNOSIS — Z79899 Other long term (current) drug therapy: Secondary | ICD-10-CM

## 2012-12-04 DIAGNOSIS — Z87891 Personal history of nicotine dependence: Secondary | ICD-10-CM

## 2012-12-04 DIAGNOSIS — E119 Type 2 diabetes mellitus without complications: Secondary | ICD-10-CM

## 2012-12-04 DIAGNOSIS — R5383 Other fatigue: Secondary | ICD-10-CM

## 2012-12-04 DIAGNOSIS — J438 Other emphysema: Secondary | ICD-10-CM

## 2012-12-04 DIAGNOSIS — R21 Rash and other nonspecific skin eruption: Secondary | ICD-10-CM

## 2012-12-04 DIAGNOSIS — R413 Other amnesia: Secondary | ICD-10-CM | POA: Diagnosis present

## 2012-12-04 DIAGNOSIS — D131 Benign neoplasm of stomach: Secondary | ICD-10-CM | POA: Diagnosis present

## 2012-12-04 DIAGNOSIS — J309 Allergic rhinitis, unspecified: Secondary | ICD-10-CM

## 2012-12-04 DIAGNOSIS — I5030 Unspecified diastolic (congestive) heart failure: Secondary | ICD-10-CM

## 2012-12-04 DIAGNOSIS — I5032 Chronic diastolic (congestive) heart failure: Secondary | ICD-10-CM | POA: Diagnosis present

## 2012-12-04 DIAGNOSIS — K921 Melena: Secondary | ICD-10-CM

## 2012-12-04 DIAGNOSIS — J4489 Other specified chronic obstructive pulmonary disease: Secondary | ICD-10-CM | POA: Diagnosis present

## 2012-12-04 DIAGNOSIS — D509 Iron deficiency anemia, unspecified: Secondary | ICD-10-CM

## 2012-12-04 DIAGNOSIS — Z880 Allergy status to penicillin: Secondary | ICD-10-CM

## 2012-12-04 DIAGNOSIS — R0989 Other specified symptoms and signs involving the circulatory and respiratory systems: Secondary | ICD-10-CM

## 2012-12-04 DIAGNOSIS — H6120 Impacted cerumen, unspecified ear: Secondary | ICD-10-CM

## 2012-12-04 DIAGNOSIS — I252 Old myocardial infarction: Secondary | ICD-10-CM

## 2012-12-04 DIAGNOSIS — I5031 Acute diastolic (congestive) heart failure: Principal | ICD-10-CM | POA: Diagnosis present

## 2012-12-04 DIAGNOSIS — K922 Gastrointestinal hemorrhage, unspecified: Secondary | ICD-10-CM

## 2012-12-04 DIAGNOSIS — R0600 Dyspnea, unspecified: Secondary | ICD-10-CM

## 2012-12-04 DIAGNOSIS — E785 Hyperlipidemia, unspecified: Secondary | ICD-10-CM

## 2012-12-04 DIAGNOSIS — J449 Chronic obstructive pulmonary disease, unspecified: Secondary | ICD-10-CM

## 2012-12-04 DIAGNOSIS — J9620 Acute and chronic respiratory failure, unspecified whether with hypoxia or hypercapnia: Secondary | ICD-10-CM

## 2012-12-04 DIAGNOSIS — J9621 Acute and chronic respiratory failure with hypoxia: Secondary | ICD-10-CM

## 2012-12-04 DIAGNOSIS — R7989 Other specified abnormal findings of blood chemistry: Secondary | ICD-10-CM

## 2012-12-04 DIAGNOSIS — Z951 Presence of aortocoronary bypass graft: Secondary | ICD-10-CM

## 2012-12-04 HISTORY — DX: Other amnesia: R41.3

## 2012-12-04 LAB — COMPREHENSIVE METABOLIC PANEL
BUN: 10 mg/dL (ref 6–23)
Calcium: 8.7 mg/dL (ref 8.4–10.5)
Creatinine, Ser: 0.82 mg/dL (ref 0.50–1.35)
GFR calc Af Amer: >90 mL/min (ref >90)
Glucose, Bld: 217 mg/dL — ABNORMAL HIGH (ref 70–99)
Total Protein: 6.8 g/dL (ref 6.0–8.3)

## 2012-12-04 LAB — CBC WITH DIFFERENTIAL/PLATELET
Basophils Relative: 0 % (ref 0–1)
Eosinophils Absolute: 0.1 10*3/uL (ref 0.0–0.7)
Eosinophils Relative: 1 % (ref 0–5)
Hemoglobin: 7.3 g/dL — ABNORMAL LOW (ref 13.0–17.0)
Lymphs Abs: 1.6 10*3/uL (ref 0.7–4.0)
MCH: 22.2 pg — ABNORMAL LOW (ref 26.0–34.0)
MCHC: 27.3 g/dL — ABNORMAL LOW (ref 30.0–36.0)
MCV: 81.2 fL (ref 78.0–100.0)
Monocytes Relative: 9 % (ref 3–12)
RBC: 3.29 MIL/uL — ABNORMAL LOW (ref 4.22–5.81)

## 2012-12-04 LAB — TROPONIN I
Troponin I: <0.3 ng/mL (ref <0.30)
Troponin I: <0.3 ng/mL (ref <0.30)

## 2012-12-04 LAB — POCT I-STAT TROPONIN I: Troponin i, poc: 0.01 ng/mL (ref 0.00–0.08)

## 2012-12-04 LAB — URINALYSIS, ROUTINE W REFLEX MICROSCOPIC
Bilirubin Urine: NEGATIVE
Nitrite: NEGATIVE
Specific Gravity, Urine: 1.025 (ref 1.005–1.030)
Urobilinogen, UA: 0.2 mg/dL (ref 0.0–1.0)

## 2012-12-04 LAB — PROTIME-INR
INR: 1.02 (ref 0.00–1.49)
Prothrombin Time: 13.3 seconds (ref 11.6–15.2)

## 2012-12-04 MED ORDER — ALBUTEROL SULFATE (5 MG/ML) 0.5% IN NEBU
5.0000 mg | INHALATION_SOLUTION | Freq: Once | RESPIRATORY_TRACT | Status: AC
  Administered 2012-12-04: 5 mg via RESPIRATORY_TRACT
  Filled 2012-12-04: qty 1

## 2012-12-04 MED ORDER — METOPROLOL TARTRATE 12.5 MG HALF TABLET
12.5000 mg | ORAL_TABLET | Freq: Two times a day (BID) | ORAL | Status: DC
  Filled 2012-12-04: qty 1

## 2012-12-04 MED ORDER — BIOTENE DRY MOUTH MT LIQD
15.0000 mL | Freq: Two times a day (BID) | OROMUCOSAL | Status: DC
  Administered 2012-12-05 – 2012-12-08 (×3): 15 mL via OROMUCOSAL

## 2012-12-04 MED ORDER — INSULIN ASPART 100 UNIT/ML ~~LOC~~ SOLN
0.0000 [IU] | Freq: Three times a day (TID) | SUBCUTANEOUS | Status: DC
  Administered 2012-12-04 – 2012-12-05 (×2): 2 [IU] via SUBCUTANEOUS
  Administered 2012-12-05: 3 [IU] via SUBCUTANEOUS
  Administered 2012-12-05: 5 [IU] via SUBCUTANEOUS
  Administered 2012-12-06: 2 [IU] via SUBCUTANEOUS
  Administered 2012-12-06: 3 [IU] via SUBCUTANEOUS
  Administered 2012-12-06 – 2012-12-08 (×4): 2 [IU] via SUBCUTANEOUS

## 2012-12-04 MED ORDER — CITALOPRAM HYDROBROMIDE 10 MG PO TABS
10.0000 mg | ORAL_TABLET | Freq: Every day | ORAL | Status: DC
Start: 2012-12-05 — End: 2012-12-08
  Administered 2012-12-05 – 2012-12-08 (×4): 10 mg via ORAL
  Filled 2012-12-04 (×4): qty 1

## 2012-12-04 MED ORDER — ALBUTEROL SULFATE (5 MG/ML) 0.5% IN NEBU
2.5000 mg | INHALATION_SOLUTION | RESPIRATORY_TRACT | Status: DC
  Administered 2012-12-04 (×2): 2.5 mg via RESPIRATORY_TRACT
  Filled 2012-12-04 (×2): qty 0.5

## 2012-12-04 MED ORDER — BUDESONIDE 0.5 MG/2ML IN SUSP
0.5000 mg | Freq: Two times a day (BID) | RESPIRATORY_TRACT | Status: DC
  Administered 2012-12-04 – 2012-12-08 (×8): 0.5 mg via RESPIRATORY_TRACT
  Filled 2012-12-04 (×10): qty 2

## 2012-12-04 MED ORDER — SODIUM CHLORIDE 0.9 % IJ SOLN
3.0000 mL | Freq: Two times a day (BID) | INTRAMUSCULAR | Status: DC
  Administered 2012-12-04: 21:00:00 via INTRAVENOUS
  Administered 2012-12-05 – 2012-12-07 (×5): 3 mL via INTRAVENOUS

## 2012-12-04 MED ORDER — ALPRAZOLAM 0.25 MG PO TABS
0.2500 mg | ORAL_TABLET | Freq: Three times a day (TID) | ORAL | Status: DC | PRN
  Administered 2012-12-04 – 2012-12-08 (×9): 0.25 mg via ORAL
  Filled 2012-12-04 (×10): qty 1

## 2012-12-04 MED ORDER — FUROSEMIDE 10 MG/ML IJ SOLN
20.0000 mg | Freq: Once | INTRAMUSCULAR | Status: AC
  Administered 2012-12-04: 20 mg via INTRAVENOUS
  Filled 2012-12-04: qty 2

## 2012-12-04 MED ORDER — ONDANSETRON HCL 4 MG PO TABS: 4.0000 mg | ORAL_TABLET | Freq: Four times a day (QID) | ORAL | Status: DC | PRN

## 2012-12-04 MED ORDER — ZOLPIDEM TARTRATE 5 MG PO TABS
5.0000 mg | ORAL_TABLET | Freq: Every day | ORAL | Status: DC
  Administered 2012-12-04: 5 mg via ORAL
  Filled 2012-12-04: qty 1

## 2012-12-04 MED ORDER — ATORVASTATIN CALCIUM 40 MG PO TABS
40.0000 mg | ORAL_TABLET | Freq: Every day | ORAL | Status: DC
Start: 2012-12-05 — End: 2012-12-08
  Administered 2012-12-05 – 2012-12-07 (×3): 40 mg via ORAL
  Filled 2012-12-04 (×4): qty 1

## 2012-12-04 MED ORDER — ONDANSETRON HCL 4 MG/2ML IJ SOLN: 4.0000 mg | Freq: Four times a day (QID) | INTRAMUSCULAR | Status: DC | PRN

## 2012-12-04 MED ORDER — IPRATROPIUM BROMIDE 0.02 % IN SOLN
0.5000 mg | RESPIRATORY_TRACT | Status: DC
  Administered 2012-12-04 (×2): 0.5 mg via RESPIRATORY_TRACT
  Filled 2012-12-04 (×2): qty 2.5

## 2012-12-04 MED ORDER — ALBUTEROL SULFATE (5 MG/ML) 0.5% IN NEBU: 2.5000 mg | INHALATION_SOLUTION | RESPIRATORY_TRACT | Status: DC | PRN

## 2012-12-04 MED ORDER — FOLIC ACID 1 MG PO TABS
1.0000 mg | ORAL_TABLET | Freq: Every day | ORAL | Status: DC
  Administered 2012-12-04 – 2012-12-08 (×5): 1 mg via ORAL
  Filled 2012-12-04 (×5): qty 1

## 2012-12-04 MED ORDER — POLYSACCHARIDE IRON COMPLEX 150 MG PO CAPS
150.0000 mg | ORAL_CAPSULE | Freq: Two times a day (BID) | ORAL | Status: DC
  Administered 2012-12-04 – 2012-12-08 (×8): 150 mg via ORAL
  Filled 2012-12-04 (×10): qty 1

## 2012-12-04 MED ORDER — METOPROLOL TARTRATE 25 MG PO TABS
25.0000 mg | ORAL_TABLET | Freq: Two times a day (BID) | ORAL | Status: DC
  Administered 2012-12-04: 12.5 mg via ORAL
  Administered 2012-12-05 – 2012-12-08 (×7): 25 mg via ORAL
  Filled 2012-12-04 (×9): qty 1

## 2012-12-04 MED ORDER — INSULIN ASPART 100 UNIT/ML ~~LOC~~ SOLN
0.0000 [IU] | Freq: Every day | SUBCUTANEOUS | Status: DC
  Administered 2012-12-05 – 2012-12-07 (×2): 2 [IU] via SUBCUTANEOUS

## 2012-12-04 MED ORDER — HYDROCODONE-ACETAMINOPHEN 5-325 MG PO TABS
1.0000 | ORAL_TABLET | ORAL | Status: DC | PRN
  Administered 2012-12-05 – 2012-12-08 (×8): 1 via ORAL
  Filled 2012-12-04 (×9): qty 1

## 2012-12-04 MED ORDER — IPRATROPIUM BROMIDE 0.02 % IN SOLN
0.5000 mg | Freq: Once | RESPIRATORY_TRACT | Status: AC
  Administered 2012-12-04: 0.5 mg via RESPIRATORY_TRACT
  Filled 2012-12-04: qty 2.5

## 2012-12-04 MED ORDER — PANTOPRAZOLE SODIUM 40 MG PO TBEC
40.0000 mg | DELAYED_RELEASE_TABLET | Freq: Two times a day (BID) | ORAL | Status: DC
  Administered 2012-12-04 – 2012-12-08 (×8): 40 mg via ORAL
  Filled 2012-12-04 (×10): qty 1

## 2012-12-04 NOTE — ED Notes (Signed)
IV team paged for IV start and labs.

## 2012-12-04 NOTE — ED Notes (Signed)
Pt given [place] sandwich and 1 cup of water. Dr. Lynelle Doctor aware.
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
## 2012-12-04 NOTE — Progress Notes (Signed)
[facility] ED Cm note CM consult for possible home health services but when ED CM spoke with him, he refused services.  He states he does not thinking he needs it Confirms he has Oxygen, bedside commode, cane, walker at home but does not recall agency assisting him with it. Reports all he wants is for a nurse to come to his home to do is to check his blood sugar because even with frequent teaching at the hospital he has not figured out how to use his blood sugar machine.  CM offered to do a consult with DM educator but pt refused. Pt can not recall the name of the blood sugar machine.  Pt then said even if they do come out I still will not be able to do it CM inquired if he has a support person or primary caregiver to learn how to check blood sugars and he stated "No, my wife can't learn how to either. [place] other people are busy with their lives"  Pt noted in [place] in 2013 to [facility].

## 2012-12-04 NOTE — ED Notes (Signed)
Pt is aware that a urine sample is needed, and stated that he will call us in as soon as he has to go.

## 2012-12-04 NOTE — ED Notes (Signed)
Pt arrives from home with c/o SOB, chest pain, and syncope. Reports feeling "bad" x's a few weeks. Pt has extensive cardiac hx.

## 2012-12-04 NOTE — ED Notes (Signed)
Pt requesting to eat, explained to pt that lab work has to be collected before pt is able to eat. Pt upset and states "this happens to me everytime I come here, I'm not waiting no 24hrs before I eat." pt informed his time in department and that IV team has been notified that IV and labs are needed. Pt continues to be upset.

## 2012-12-04 NOTE — ED Notes (Signed)
IV team at bedside 

## 2012-12-04 NOTE — Telephone Encounter (Signed)
Pt got up this morning 7 am to go to bathroom and passed out for few seconds and fell to floor. Pt said he felt dizzy prior to passing out. No chest pain but some trouble breathing which was better after putting his oxygen back on. Pt had no h/a. Pt scheduled appt to see Dr Dayton Martes on 12/05/12 at 9:45 am earlier. Pt refused to go to ED for eval or see another doctor at [facility] today.(Dr Dayton Martes said OK to leave 12/05/12 appt  incase needs f/u after ER visit).Dr Dayton Martes advised pt had to go to ED for eval now. Advised pt and he agreed to let his wife take him to [facility] ER now.
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
## 2012-12-05 ENCOUNTER — Other Ambulatory Visit: Payer: Self-pay | Admitting: Gastroenterology

## 2012-12-05 ENCOUNTER — Ambulatory Visit: Payer: Medicare Other | Admitting: Family Medicine

## 2012-12-05 DIAGNOSIS — I517 Cardiomegaly: Secondary | ICD-10-CM

## 2012-12-05 DIAGNOSIS — R609 Edema, unspecified: Secondary | ICD-10-CM

## 2012-12-05 DIAGNOSIS — R799 Abnormal finding of blood chemistry, unspecified: Secondary | ICD-10-CM

## 2012-12-05 LAB — IRON AND TIBC
Saturation Ratios: 4 % — ABNORMAL LOW (ref 20–55)
UIBC: 436 ug/dL — ABNORMAL HIGH (ref 125–400)

## 2012-12-05 LAB — CBC
HCT: 25.7 % — ABNORMAL LOW (ref 39.0–52.0)
MCH: 21.7 pg — ABNORMAL LOW (ref 26.0–34.0)
MCV: 79.8 fL (ref 78.0–100.0)
Platelets: 266 10*3/uL (ref 150–400)
RBC: 3.22 MIL/uL — ABNORMAL LOW (ref 4.22–5.81)
WBC: 8.6 10*3/uL (ref 4.0–10.5)

## 2012-12-05 LAB — GLUCOSE, CAPILLARY: Glucose-Capillary: 245 mg/dL — ABNORMAL HIGH (ref 70–99)

## 2012-12-05 LAB — HEMOGLOBIN A1C: Hgb A1c MFr Bld: 7.7 % — ABNORMAL HIGH (ref <5.7)

## 2012-12-05 LAB — BASIC METABOLIC PANEL
CO2: 26 mEq/L (ref 19–32)
Calcium: 8.3 mg/dL — ABNORMAL LOW (ref 8.4–10.5)
Chloride: 97 mEq/L (ref 96–112)
Glucose, Bld: 216 mg/dL — ABNORMAL HIGH (ref 70–99)
Sodium: 135 mEq/L (ref 135–145)

## 2012-12-05 LAB — FERRITIN: Ferritin: 7 ng/mL — ABNORMAL LOW (ref 22–322)

## 2012-12-05 LAB — TRANSFERRIN: Transferrin: 344 mg/dL (ref 200–360)

## 2012-12-05 LAB — VITAMIN B12: Vitamin B-12: 282 pg/mL (ref 211–911)

## 2012-12-05 LAB — PREPARE RBC (CROSSMATCH)

## 2012-12-05 MED ORDER — SODIUM CHLORIDE 0.9 % IV SOLN: INTRAVENOUS | Status: DC

## 2012-12-05 MED ORDER — ALBUTEROL SULFATE (5 MG/ML) 0.5% IN NEBU
2.5000 mg | INHALATION_SOLUTION | RESPIRATORY_TRACT | Status: DC | PRN
  Administered 2012-12-08: 2.5 mg via RESPIRATORY_TRACT
  Filled 2012-12-05: qty 0.5

## 2012-12-05 MED ORDER — ZOLPIDEM TARTRATE 10 MG PO TABS
5.0000 mg | ORAL_TABLET | Freq: Every day | ORAL | Status: DC
  Filled 2012-12-05: qty 1

## 2012-12-05 MED ORDER — IPRATROPIUM BROMIDE 0.02 % IN SOLN
0.5000 mg | Freq: Three times a day (TID) | RESPIRATORY_TRACT | Status: DC
  Administered 2012-12-05 – 2012-12-08 (×8): 0.5 mg via RESPIRATORY_TRACT
  Filled 2012-12-05 (×9): qty 2.5

## 2012-12-05 MED ORDER — ALBUTEROL SULFATE (5 MG/ML) 0.5% IN NEBU
2.5000 mg | INHALATION_SOLUTION | Freq: Three times a day (TID) | RESPIRATORY_TRACT | Status: DC
  Administered 2012-12-05 – 2012-12-08 (×8): 2.5 mg via RESPIRATORY_TRACT
  Filled 2012-12-05 (×9): qty 0.5

## 2012-12-05 MED ORDER — ALBUTEROL SULFATE (5 MG/ML) 0.5% IN NEBU: 2.5000 mg | INHALATION_SOLUTION | RESPIRATORY_TRACT | Status: DC

## 2012-12-05 MED ORDER — SODIUM CHLORIDE 0.9 % IV SOLN
INTRAVENOUS | Status: DC
  Administered 2012-12-06: 20 mL/h via INTRAVENOUS

## 2012-12-05 MED ORDER — ZOLPIDEM TARTRATE 5 MG PO TABS
5.0000 mg | ORAL_TABLET | Freq: Every day | ORAL | Status: DC
  Administered 2012-12-05 – 2012-12-06 (×2): 5 mg via ORAL
  Filled 2012-12-05 (×2): qty 1

## 2012-12-05 MED ORDER — IPRATROPIUM BROMIDE 0.02 % IN SOLN: 0.5000 mg | Freq: Three times a day (TID) | RESPIRATORY_TRACT | Status: DC

## 2012-12-05 NOTE — Progress Notes (Signed)
From 1950 - 2050 RT, Dept Mgr Patrina Levering, NT Marylene Land and  Nurse in to see pt at pt request.  Pt extremely upset, irritable, verbally aggressive, needy.  Allowed to verbal at length.  Very resistant to any education or information.  States more than once "he knows it all".   Requesting medication to "chill him out".   During  shift pt has been allowed to continue to verbalize at length when time allowed, we have soaked his feet, offered him a bath (which he refused)  lotioned his back, given him heating pads, 2 snacks, and have left a message for MD for the pts following requests: increase Ambien to 10 mgs, LOC, K-pad, and he would also like "Crista Elliot ordered".  Is a little less aggressive and irritable at end of shift.  Patrina Levering, mgr,  to follow up with him on day shift.

## 2012-12-05 NOTE — Progress Notes (Signed)
Inpatient Diabetes Program Recommendations  AACE/ADA: New Consensus Statement on Inpatient Glycemic Control (2013)  Target Ranges:  Prepandial:   less than 140 mg/dL      Peak postprandial:   less than 180 mg/dL (1-2 hours)      Critically ill patients:  140 - 180 mg/dL   Reason for Visit: Hyperglycemia and HgbA1C of 7.7% (12/04/2012)  Inpatient Diabetes Program Recommendations Insulin - Basal: Add Lantus 10 units QHS Correction (SSI): Increase Novolog to moderate tidwc and hs  Note: Will continue to follow.  Pt on metformin at home.  HgbA1C has increased from 6.7% in June 2013 to 7.7% at present.  May need medication adjustment prior to discharge.  Thank you. Ailene Ards, RD, LDN, CDE Inpatient Diabetes Coordinator [phone]

## 2012-12-05 NOTE — Plan of Care (Signed)
Problem: Phase I Progression Outcomes Goal: Pt OOB to Walk or Exercise Daily With Nursing or PT Patient OOB to walk or exercise daily with nursing or PT if activity order permits  Outcome: Not Met (add Reason) Patient refused PT/OT today
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
## 2012-12-05 NOTE — Progress Notes (Signed)
PT Cancellation Note  Patient Details Name: SAKIB NOGUEZ MRN: [id number] DOB: 1941-05-27   Cancelled Treatment:    Reason Eval/Treat Not Completed: Fatigue/lethargy limiting ability to participate Pt reports being very tired since he wasn't given his regular dose of ambien last night and would like to speak with MD prior to participating.   Sumayyah Custodio,KATHrine E 12/05/2012, 10:22 AM Pager: [phone]

## 2012-12-05 NOTE — Progress Notes (Signed)
Nutrition Brief Note   RD consulted for assessment of nutrition status/requirements.  Lab Results  Component Value Date   HGBA1C 7.7* 12/04/2012    Pt was resistant to diabetes education and refused stating he has heard it before and just doesn't understand it. Pt states his main goal is feeling better and being able to walk. Pt states walking on the beach [place] him the greatest joy in life. Emphasized to pt how losing wt and lowering blood glucose can help him reach that goal. Pt was willing to discuss his usual diet and ways to improve it. Identified carbohydrate foods in pt's diet and suggested ways to decrease calories and carbohydrates.  Pt states he usually eats small portions, stops when he is full, and puts leftovers in the refrigerator. Pt does not drink soft drinks or sweet tea- he drinks mostly water, 2 cups of juice per day, and multiple cups of coffee with sugar daily. Pt also snacks on crackers and cookies and eats 1 slice of cake some days. Pt eats 3 meals daily and complains that his wife feeds him too much bread. Pt states he doesn't drink alcohol or eat fried foods. Pt was praised for this and other healthy habits like small portion sizes.  Identified changes pt will make at home: 1) Pt will eat open faced sandwiches with one slice of bread instead of 2. 2) Pt will limit juice to 8 oz per day 3) Pt will measure out 1-2 servings of crackers, cookies, chips etc instead of eating straight from the box/bag 4) Pt will eat jello made with real fruit instead of jello topped with cool whip  Pt will also continue to eat small portion sizes, 3 meals/day, and drink mostly water.   RD provided "Weight Loss Tips" handout from the Academy of [employer] and "10 Tips to a healthy Plate" from https://www.bernard.org/. Provided examples of ways to balance meals/snacks and encouraged intake of high-fiber, whole grain complex carbohydrates. Teach back method used.  Expect good  compliance.  Body mass index is 33.07 kg/(m^2). Pt meets criteria for Obese based on current BMI. Pt reports his usual body weight is 210 lbs. Today pt's weight is 264 lbs.  Current diet order is CHO modified, patient is consuming approximately 100% of meals at this time. Labs and medications reviewed. No further nutrition interventions warranted at this time. RD contact information provided. If additional nutrition issues arise, please re-consult RD.  [name] RD, LDN Inpatient Clinical Dietitian Pager: [phone] After Hours Pager: [phone]

## 2012-12-05 NOTE — Plan of Care (Signed)
Problem: Consults Goal: Diabetes Guidelines if Diabetic/Glucose > 140 If diabetic or lab glucose is > 140 mg/dl - Initiate Diabetes/Hyperglycemia Guidelines & Document Interventions  Outcome: Progressing DM coordinator met with patient

## 2012-12-05 NOTE — Progress Notes (Signed)
Discussed with patient that [name] could increase the risk for falls/snycopal episodes.  Patient not responsive to education and states [name] is not the problem and that the dosage is not up for discussion.  Patient understands risk and insisting on increasing dose to 10 mg.  Discussed with Md and entered order for 10 mg of Ambien.

## 2012-12-05 NOTE — Progress Notes (Signed)
   CARE MANAGEMENT NOTE 12/05/2012  Patient:  [name], [name]   Account Number:  [id number]  Date Initiated:  12/05/2012  Documentation initiated by:  Jiles Crocker  Subjective/Objective Assessment:   ADMITTED WITH SYNCOPY, COPD     Action/Plan:   PCP:  [name], MD  LIVES AT HOME WITH SPOUSE; POSSIBLY NEED HHC AT DISCHARGE; PATIENT GOES TO [facility] AND USE [facility];   Anticipated DC Date:  12/12/2012   Anticipated DC Plan:  HOME W HOME HEALTH SERVICES      DC Planning Services  CM consult         Status of service:  In process, will continue to follow Medicare Important Message given?  NA - LOS <3 / Initial given by admissions (If response is "NO", the following Medicare IM given date fields will be blank) Per UR Regulation:  Reviewed for med. necessity/level of care/duration of stay Comments:  12/05/2012- B Michelangelo Rindfleisch RN,BSN,MHA

## 2012-12-05 NOTE — Progress Notes (Signed)
Bilateral:  No evidence of DVT, superficial thrombosis, or Baker's Cyst.   

## 2012-12-05 NOTE — Plan of Care (Signed)
Problem: Phase I Progression Outcomes Goal: O2 sats > or equal 90% or at baseline Outcome: Progressing 97-100% on 2-3 L

## 2012-12-05 NOTE — Progress Notes (Signed)
  Echocardiogram 2D Echocardiogram has been performed.  [name] 12/05/2012, 10:59 AM

## 2012-12-05 NOTE — Plan of Care (Signed)
Problem: Phase I Progression Outcomes Goal: EF % per last Echo/documented,Core Reminder form on chart Outcome: Completed/Met Date Met:  12/05/12 65-70%

## 2012-12-05 NOTE — Progress Notes (Signed)
OT Cancellation Note  Patient Details Name: [name] MRN: [id number] DOB: January 17, 1941   Cancelled Treatment:    Reason Eval/Treat Not Completed: Other (comment) (Pt refused until he speaks to the MD.)  Loras Grieshop A OTR/L [phone] 12/05/2012, 10:12 AM

## 2012-12-05 NOTE — Progress Notes (Addendum)
Patient requesting 10 mg of Ambien.  Per pharmacist, must document that 5 mg isn't working for patient and not at increased risk for falls.  Per night nurse, patient did not sleep last night and wants dose increased.  At this time, Md is wants to wait on increasing dose until the reason for syncopal episode is determined.

## 2012-12-05 NOTE — Progress Notes (Signed)
Order to infuse 2 units of PRBC.  Dr. Loreta Ave requests to run over 4 hours.  81ml/hr

## 2012-12-05 NOTE — Progress Notes (Signed)
Patient wants 02 to be set at 3L rather than 2L.  States he thinks we should be taking orders regarding O2 levels with his regular Md.  Attempted education, not receptive.

## 2012-12-06 ENCOUNTER — Encounter (HOSPITAL_COMMUNITY)
Admission: EM | Disposition: A | Discharge status: Home Health | Payer: Self-pay | Source: Home / Self Care | Attending: Internal Medicine

## 2012-12-06 ENCOUNTER — Encounter (HOSPITAL_COMMUNITY): Payer: Self-pay | Admitting: *Deleted

## 2012-12-06 HISTORY — PX: ESOPHAGOGASTRODUODENOSCOPY: SHX5428

## 2012-12-06 LAB — CBC
HCT: 30.4 % — ABNORMAL LOW (ref 39.0–52.0)
Hemoglobin: 8.9 g/dL — ABNORMAL LOW (ref 13.0–17.0)
MCHC: 29.3 g/dL — ABNORMAL LOW (ref 30.0–36.0)
MCV: 80.6 fL (ref 78.0–100.0)

## 2012-12-06 LAB — GLUCOSE, CAPILLARY: Glucose-Capillary: 191 mg/dL — ABNORMAL HIGH (ref 70–99)

## 2012-12-06 SURGERY — EGD (ESOPHAGOGASTRODUODENOSCOPY)
Anesthesia: Moderate Sedation

## 2012-12-06 MED ORDER — DIPHENHYDRAMINE HCL 50 MG/ML IJ SOLN
INTRAMUSCULAR | Status: AC
  Filled 2012-12-06: qty 1

## 2012-12-06 MED ORDER — BUTAMBEN-TETRACAINE-BENZOCAINE 2-2-14 % EX AERO
INHALATION_SPRAY | CUTANEOUS | Status: DC | PRN
  Administered 2012-12-06: 2 via TOPICAL

## 2012-12-06 MED ORDER — MIDAZOLAM HCL 10 MG/2ML IJ SOLN
INTRAMUSCULAR | Status: AC
  Filled 2012-12-06: qty 2

## 2012-12-06 MED ORDER — ALPRAZOLAM 0.25 MG PO TABS
0.2500 mg | ORAL_TABLET | Freq: Once | ORAL | Status: AC
  Administered 2012-12-08: 0.25 mg via ORAL
  Filled 2012-12-06: qty 1

## 2012-12-06 MED ORDER — LORAZEPAM 2 MG/ML IJ SOLN: 0.5000 mg | Freq: Once | INTRAMUSCULAR | Status: DC

## 2012-12-06 MED ORDER — FENTANYL CITRATE 0.05 MG/ML IJ SOLN
INTRAMUSCULAR | Status: DC | PRN
  Administered 2012-12-06: 25 ug via INTRAVENOUS
  Administered 2012-12-06 (×2): 12.5 ug via INTRAVENOUS

## 2012-12-06 MED ORDER — FENTANYL CITRATE 0.05 MG/ML IJ SOLN
INTRAMUSCULAR | Status: AC
  Filled 2012-12-06: qty 2

## 2012-12-06 MED ORDER — ZOLPIDEM TARTRATE 5 MG PO TABS
5.0000 mg | ORAL_TABLET | Freq: Once | ORAL | Status: AC
  Administered 2012-12-06: 5 mg via ORAL
  Filled 2012-12-06: qty 1

## 2012-12-06 MED ORDER — MIDAZOLAM HCL 10 MG/2ML IJ SOLN
INTRAMUSCULAR | Status: DC | PRN
  Administered 2012-12-06: 2 mg via INTRAVENOUS
  Administered 2012-12-06 (×3): 1 mg via INTRAVENOUS

## 2012-12-06 NOTE — Progress Notes (Addendum)
[facility] PROGRESS NOTE  [name] AVW:[id number] DOB: [birth year] DOA: 12/04/2012 PCP: [name], MD  Assessment/Plan:  Acute on chronic hypoxic respiratory failure likely due to COPD versus versus acute diastolic CHF given elevated BNP and increased LEE. May also have some symptomatic anemia. Was  on 2.5 to 3L.  -will Symptomatic anemia, likely due to ongoing GI losses  -I have consulted  GI/ Dr Loreta Ave -Transfuse 2U PRBC as discussed with GI -Follow anemia studies Syncope, per history sounds related to hypoxia when he tries to ambulate without his oxygen vs 2/2 to above - Encouraged oxygen  - pt is not Orthostatic per vitals - troponins so far neg -await echo Elevated BNP:  - await ECHO as above, follow and further diurese as needed Asymmetric lower extremity swelling  - Duplex lower extremities  Neg for DVT CAD, stable, Continue home meds -CE neg, follow echo  HTN, BP stable.  -Continue home meds  T2DM: Hold home meds. SSI   Code Status: full code  Family Communication: spoke with patient alone  Disposition Plan: likely home when medically ready   Consultants:  GI - awaiting eval  Procedures:  none  Antibiotics:  none  HPI/Subjective: States breathing better this am. Admits to melena- last noted yesterday  Objective: Filed Vitals:   12/05/12 2345 12/06/12 0015 12/06/12 0040 12/06/12 0140  BP:  130/67 129/73 129/88  Pulse:  77 84 76  Temp:  97.9 F (36.6 C) 97.7 F (36.5 C) 97 F (36.1 C)  TempSrc: Oral Oral Oral Oral  Resp:  20 20 20   Height:      Weight:      SpO2:        Intake/Output Summary (Last 24 hours) at 12/06/12 0234 Last data filed at 12/06/12 0145  Gross per 24 hour  Intake 1856.83 ml  Output   1425 ml  Net 431.83 ml   Filed Weights   12/04/12 1707 12/05/12 0434  Weight: 119.75 kg (264 lb) 120 kg (264 lb 8.8 oz)    Exam:   General:  Obese elderly male in NAD  Cardiovascular: RRR, nl SIS2  Respiratory:  decreased air entry bil, no wheezes, non labored resp.  Abdomen: soft+BS, NT/ND  EXTREMITIES: trace edema, no cyanosis  Data Reviewed: Basic Metabolic Panel:  Recent Labs Lab 12/04/12 1335 12/05/12 0515  NA 140 135  K 3.8 3.5  CL 102 97  CO2 29 26  GLUCOSE 217* 216*  BUN 10 11  CREATININE 0.82 0.79  CALCIUM 8.7 8.3*   Liver Function Tests:  Recent Labs Lab 12/04/12 1335  AST 11  ALT 9  ALKPHOS 129*  BILITOT 0.3  PROT 6.8  ALBUMIN 3.2*   No results found for this basename: LIPASE, AMYLASE,  in the last 168 hours No results found for this basename: AMMONIA,  in the last 168 hours CBC:  Recent Labs Lab 12/04/12 1335 12/05/12 0515  WBC 9.7 8.6  NEUTROABS 7.2  --   HGB 7.3* 7.0*  HCT 26.7* 25.7*  MCV 81.2 79.8  PLT 257 266   Cardiac Enzymes:  Recent Labs Lab 12/04/12 1830 12/04/12 2310 12/05/12 0515  TROPONINI <0.30 <0.30 <0.30   BNP (last 3 results)  Recent Labs  05/17/12 1030 12/04/12 1335  PROBNP 375.0* 1065.0*   CBG:  Recent Labs Lab 12/04/12 2100 12/05/12 0710 12/05/12 1138 12/05/12 1619 12/05/12 2303  GLUCAP 166* 245* 264* 195* 240*    No results found for this or any previous visit (from  the past 240 hour(s)).   Studies: Dg Chest 1 View  12/04/2012  *RADIOLOGY REPORT*  Clinical Data: Shortness of breath.  CHEST - 1 VIEW  Comparison: 11/06/2012.  Findings: The cardiac silhouette, mediastinal and hilar contours are within normal limits and stable.  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  Bibasilar atelectasis is noted.  No pleural effusion or pulmonary edema.  Left apical bulla is stable.  The bony thorax is intact.  IMPRESSION:  1.  Bibasilar atelectasis. 2.  No infiltrates, edema or effusions.   Original Report Authenticated By: Rudie Meyer, M.D.    Dg Knee Complete 4 Views Right  12/04/2012  *RADIOLOGY REPORT*  Clinical Data: Right knee pain  RIGHT KNEE - COMPLETE 4+ VIEW  Comparison: None  Findings: The joint spaces are  maintained.  No acute fracture or osteochondral lesion.  No joint effusion.  IMPRESSION: No acute bony findings or joint effusion.   Original Report Authenticated By: Rudie Meyer, M.D.     Scheduled Meds: . albuterol  2.5 mg Nebulization TID   And  . ipratropium  0.5 mg Nebulization TID  . antiseptic oral rinse  15 mL Mouth Rinse BID  . atorvastatin  40 mg Oral q1800  . budesonide (PULMICORT) nebulizer solution  0.5 mg Nebulization BID  . citalopram  10 mg Oral Daily  . folic acid  1 mg Oral Daily  . insulin aspart  0-5 Units Subcutaneous QHS  . insulin aspart  0-9 Units Subcutaneous TID WC  . iron polysaccharides  150 mg Oral BID  . metoprolol tartrate  25 mg Oral BID  . pantoprazole  40 mg Oral BID  . sodium chloride  3 mL Intravenous Q12H  . zolpidem  5 mg Oral QHS   Continuous Infusions: . sodium chloride      Active Problems:   HYPERLIPIDEMIA   HYPERTENSION   ABNORMAL HEART RHYTHMS   ALLERGIC RHINITIS   COPD (chronic obstructive pulmonary disease)   Anemia   Diastolic HF (heart failure)   CAD (coronary artery disease)   AAA (abdominal aortic aneurysm)   Acute-on-chronic respiratory failure    Time spent: 19    Amun Stemm C  [facility] Pager [phone] If 8PM-8AM, please contact night-coverage at [email or web address], password [facility] 12/06/2012, 2:34 AM  LOS: 2 days

## 2012-12-06 NOTE — Progress Notes (Signed)
See OT note  Thanks,  Britain Saber, PT [phone]  

## 2012-12-06 NOTE — H&P (View-Only) (Signed)
[facility] PROGRESS NOTE  [name] MRN:[id number] DOB: 04/11/1941 DOA: 12/04/2012 PCP: Talia Aron, MD  Assessment/Plan:  Acute on chronic hypoxic respiratory failure likely due to COPD versus versus acute diastolic CHF given elevated BNP and increased LEE. May also have some symptomatic anemia. Was  on 2.5 to 3L.  -will Symptomatic anemia, likely due to ongoing GI losses  -I have consulted  GI/ Dr Mann -Transfuse 2U PRBC as discussed with GI -Follow anemia studies Syncope, per history sounds related to hypoxia when he tries to ambulate without his oxygen vs 2/2 to above - Encouraged oxygen  - pt is not Orthostatic per vitals - troponins so far neg -await echo Elevated BNP:  - await ECHO as above, follow and further diurese as needed Asymmetric lower extremity swelling  - Duplex lower extremities  Neg for DVT CAD, stable, Continue home meds -CE neg, follow echo  HTN, BP stable.  -Continue home meds  T2DM: Hold home meds. SSI   Code Status: full code  Family Communication: spoke with patient alone  Disposition Plan: likely home when medically ready   Consultants:  GI - awaiting eval  Procedures:  none  Antibiotics:  none  HPI/Subjective: States breathing better this am. Admits to melena- last noted yesterday  Objective: Filed Vitals:   12/05/12 2345 12/06/12 0015 12/06/12 0040 12/06/12 0140  BP:  130/67 129/73 129/88  Pulse:  77 84 76  Temp:  97.9 F (36.6 C) 97.7 F (36.5 C) 97 F (36.1 C)  TempSrc: Oral Oral Oral Oral  Resp:  20 20 20  Height:      Weight:      SpO2:        Intake/Output Summary (Last 24 hours) at 12/06/12 0234 Last data filed at 12/06/12 0145  Gross per 24 hour  Intake 1856.83 ml  Output   1425 ml  Net 431.83 ml   Filed Weights   12/04/12 1707 12/05/12 0434  Weight: 119.75 kg (264 lb) 120 kg (264 lb 8.8 oz)    Exam:   General:  Obese elderly male in NAD  Cardiovascular: RRR, nl SIS2  Respiratory:  decreased air entry bil, no wheezes, non labored resp.  Abdomen: soft+BS, NT/ND  EXTREMITIES: trace edema, no cyanosis  Data Reviewed: Basic Metabolic Panel:  Recent Labs Lab 12/04/12 1335 12/05/12 0515  NA 140 135  K 3.8 3.5  CL 102 97  CO2 29 26  GLUCOSE 217* 216*  BUN 10 11  CREATININE 0.82 0.79  CALCIUM 8.7 8.3*   Liver Function Tests:  Recent Labs Lab 12/04/12 1335  AST 11  ALT 9  ALKPHOS 129*  BILITOT 0.3  PROT 6.8  ALBUMIN 3.2*   No results found for this basename: LIPASE, AMYLASE,  in the last 168 hours No results found for this basename: AMMONIA,  in the last 168 hours CBC:  Recent Labs Lab 12/04/12 1335 12/05/12 0515  WBC 9.7 8.6  NEUTROABS 7.2  --   HGB 7.3* 7.0*  HCT 26.7* 25.7*  MCV 81.2 79.8  PLT 257 266   Cardiac Enzymes:  Recent Labs Lab 12/04/12 1830 12/04/12 2310 12/05/12 0515  TROPONINI <0.30 <0.30 <0.30   BNP (last 3 results)  Recent Labs  05/17/12 1030 12/04/12 1335  PROBNP 375.0* 1065.0*   CBG:  Recent Labs Lab 12/04/12 2100 12/05/12 0710 12/05/12 1138 12/05/12 1619 12/05/12 2303  GLUCAP 166* 245* 264* 195* 240*    No results found for this or any previous visit (from   the past 240 hour(s)).   Studies: Dg Chest 1 View  12/04/2012  *RADIOLOGY REPORT*  Clinical Data: Shortness of breath.  CHEST - 1 VIEW  Comparison: 11/06/2012.  Findings: The cardiac silhouette, mediastinal and hilar contours are within normal limits and stable.  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  Bibasilar atelectasis is noted.  No pleural effusion or pulmonary edema.  Left apical bulla is stable.  The bony thorax is intact.  IMPRESSION:  1.  Bibasilar atelectasis. 2.  No infiltrates, edema or effusions.   Original Report Authenticated By: P. Gallerani, M.D.    Dg Knee Complete 4 Views Right  12/04/2012  *RADIOLOGY REPORT*  Clinical Data: Right knee pain  RIGHT KNEE - COMPLETE 4+ VIEW  Comparison: None  Findings: The joint spaces are  maintained.  No acute fracture or osteochondral lesion.  No joint effusion.  IMPRESSION: No acute bony findings or joint effusion.   Original Report Authenticated By: P. Gallerani, M.D.     Scheduled Meds: . albuterol  2.5 mg Nebulization TID   And  . ipratropium  0.5 mg Nebulization TID  . antiseptic oral rinse  15 mL Mouth Rinse BID  . atorvastatin  40 mg Oral q1800  . budesonide (PULMICORT) nebulizer solution  0.5 mg Nebulization BID  . citalopram  10 mg Oral Daily  . folic acid  1 mg Oral Daily  . insulin aspart  0-5 Units Subcutaneous QHS  . insulin aspart  0-9 Units Subcutaneous TID WC  . iron polysaccharides  150 mg Oral BID  . metoprolol tartrate  25 mg Oral BID  . pantoprazole  40 mg Oral BID  . sodium chloride  3 mL Intravenous Q12H  . zolpidem  5 mg Oral QHS   Continuous Infusions: . sodium chloride      Active Problems:   HYPERLIPIDEMIA   HYPERTENSION   ABNORMAL HEART RHYTHMS   ALLERGIC RHINITIS   COPD (chronic obstructive pulmonary disease)   Anemia   Diastolic HF (heart failure)   CAD (coronary artery disease)   AAA (abdominal aortic aneurysm)   Acute-on-chronic respiratory failure    Time spent: 35    [name]  [facility] Pager [phone] If 8PM-8AM, please contact night-coverage at [email or web address], password [email or web address] 12/06/2012, 2:34 AM  LOS: 2 days              

## 2012-12-06 NOTE — Op Note (Signed)
[facility] [address] [place], [address]   OPERATIVE PROCEDURE REPORT  PATIENT: [name], [name]  MR#: [id number] BIRTHDATE: [birth year]  GENDER: Male ENDOSCOPIST: Jeani Hawking, MD ASSISTANT:   Karie Soda, technician and Angelique Blonder, RN PROCEDURE DATE: 12/06/2012 PROCEDURE:   EGD w/ snare technique ASA CLASS:   Class IV INDICATIONS:Acute post hemorrhagic anemia. MEDICATIONS: Versed 5 mg IV and Fentanyl 50 mcg IV TOPICAL ANESTHETIC:   Cetacaine Spray  DESCRIPTION OF PROCEDURE:   After the risks benefits and alternatives of the procedure were thoroughly explained, informed consent was obtained.  The EG-[id number] ([id number])  endoscope was introduced through the mouth  and advanced to the second portion of the duodenum Without limitations.      The instrument was slowly withdrawn as the mucosa was fully examined.      FINDINGS: Gastric polyps were again identified in the gastric lumen. No active bleeding was noted, but a couple of suspicious polyps were identified.  Both were removed with a hot snare, however, one of the polypectomy sites continued to ooze after resection.  A total of three hemoclips were required to arrest the bleeding.  A small D2 polyp was removed with a hot snare.  No evidence of any duodenal AVMs.   Retroflexed views revealed no abnormalities. The scope was then withdrawn from the patient and the procedure terminated.  COMPLICATIONS: There were no complications. IMPRESSION: 1) Gastric polyps.  Even though they were not bleeding at the time I feel that these are the sources of his anemia. 2) Duodenal polyp.  RECOMMENDATIONS: 1) Follow HGB. 2) Transfuse as necessary.   _______________________________ eSignedJeani Hawking, MD 12/06/2012 4:51 PM

## 2012-12-06 NOTE — Plan of Care (Signed)
Problem: Phase I Progression Outcomes Goal: Dyspnea controlled at rest Outcome: Progressing Continues to c/o SOB on exertion

## 2012-12-06 NOTE — Interval H&P Note (Signed)
History and Physical Interval Note:  12/06/2012 4:12 PM  [name]  has presented today for surgery, with the diagnosis of bleeding  The various methods of treatment have been discussed with the patient and family. After consideration of risks, benefits and other options for treatment, the patient has consented to  Procedure(s): ESOPHAGOGASTRODUODENOSCOPY (EGD) (N/A) as a surgical intervention .  The patient's history has been reviewed, patient examined, no change in status, stable for surgery.  I have reviewed the patient's chart and labs.  Questions were answered to the patient's satisfaction.     [name]

## 2012-12-06 NOTE — Progress Notes (Signed)
[facility] PROGRESS NOTE  RAMERE DOWNS ZOX:[id number] DOB: 29-Jan-1941 DOA: 12/04/2012 PCP: [name], MD  Assessment/Plan:  Acute on chronic hypoxic respiratory failure likely due to COPD versus versus acute diastolic CHF given elevated BNP and increased LEE. May also have some symptomatic anemia. Was  on 2.5 to 3L.  -will Symptomatic Iron def anemia, likely due to ongoing GI losses  -EGD today per  GI/ DrHung -hgb improved s/p transfusion -Fe is 18 and feeritn is 7- follow and give IV iroN prior to DC Syncope, per history sounds related to hypoxia when he tries to ambulate without his oxygen vs 2/2 to above - Encouraged oxygen  - pt is not Orthostatic per vitals - troponins so far neg -ECHO only with mild possible mild AS - Not likely to be the cause of the syncope Elevated BNP/Acute diastolic CHF:  - ECHO with EF 81-19% as above, follow and further diurese as needed -appears compensated at this time, continue to monitor fluid status Asymmetric lower extremity swelling  - Duplex lower extremities  Neg for DVT CAD, stable, Continue home meds -CE neg, follow echo  HTN, BP stable.  -Continue home meds  T2DM: Hold home meds. SSI   Code Status: full code  Family Communication: spoke with patient alone  Disposition Plan: likely home when medically ready   Consultants:  GI - awaiting eval  Procedures:  EGD- PENDING  ECHO Study Conclusions  - Left ventricle: The cavity size was normal. Wall thickness was increased in a pattern of mild LVH. Systolic function was vigorous. The estimated ejection fraction was in the range of 65% to 70%. Regional wall motion abnormalities cannot be excluded. Doppler parameters are consistent with abnormal left ventricular relaxation (grade 1 diastolic dysfunction). - Left atrium: The atrium was mildly dilated. - Pericardium, extracardiac: A trivial pericardial effusion was identified. Impressions:  - Elevated LVOT gradient of  2.8 m/s (mean gradient of 14 mmHg) possibly related to vigorous LV function; aortic valve visually appears to open well; cannot R/O mild AS. Transthoracic echocardiography  Antibiotics:  none  HPI/Subjective: States breathing better today, but c/o increased anxiety.awaiting EGD. Objective: Filed Vitals:   12/06/12 0543 12/06/12 0544 12/06/12 0545 12/06/12 0957  BP: 114/72 135/74 140/68   Pulse: 86 87 75   Temp: 98 F (36.7 C) 98 F (36.7 C) 98 F (36.7 C)   TempSrc: Oral Oral Oral   Resp: 20 20 20    Height:      Weight:   120.1 kg (264 lb 12.4 oz)   SpO2: 100% 99% 99% 97%    Intake/Output Summary (Last 24 hours) at 12/06/12 1324 Last data filed at 12/06/12 1300  Gross per 24 hour  Intake 1092.5 ml  Output   1675 ml  Net -582.5 ml   Filed Weights   12/04/12 1707 12/05/12 0434 12/06/12 0545  Weight: 119.75 kg (264 lb) 120 kg (264 lb 8.8 oz) 120.1 kg (264 lb 12.4 oz)    Exam:   General:  Obese elderly male in NAD  Cardiovascular: RRR, nl SIS2  Respiratory: clear, moderate air mov't, no wheezes, non labored resp.  Abdomen: soft+BS, NT/ND  EXTREMITIES: trace edema, no cyanosis  Data Reviewed: Basic Metabolic Panel:  Recent Labs Lab 12/04/12 1335 12/05/12 0515  NA 140 135  K 3.8 3.5  CL 102 97  CO2 29 26  GLUCOSE 217* 216*  BUN 10 11  CREATININE 0.82 0.79  CALCIUM 8.7 8.3*   Liver Function Tests:  Recent Labs  Lab 12/04/12 1335  AST 11  ALT 9  ALKPHOS 129*  BILITOT 0.3  PROT 6.8  ALBUMIN 3.2*   No results found for this basename: LIPASE, AMYLASE,  in the last 168 hours No results found for this basename: AMMONIA,  in the last 168 hours CBC:  Recent Labs Lab 12/04/12 1335 12/05/12 0515  WBC 9.7 8.6  NEUTROABS 7.2  --   HGB 7.3* 7.0*  HCT 26.7* 25.7*  MCV 81.2 79.8  PLT 257 266   Cardiac Enzymes:  Recent Labs Lab 12/04/12 1830 12/04/12 2310 12/05/12 0515  TROPONINI <0.30 <0.30 <0.30   BNP (last 3 results)  Recent Labs   05/17/12 1030 12/04/12 1335  PROBNP 375.0* 1065.0*   CBG:  Recent Labs Lab 12/05/12 1138 12/05/12 1619 12/05/12 2303 12/06/12 0737 12/06/12 1220  GLUCAP 264* 195* 240* 226* 191*    No results found for this or any previous visit (from the past 240 hour(s)).   Studies: No results found.  Scheduled Meds: . albuterol  2.5 mg Nebulization TID   And  . ipratropium  0.5 mg Nebulization TID  . antiseptic oral rinse  15 mL Mouth Rinse BID  . atorvastatin  40 mg Oral q1800  . budesonide (PULMICORT) nebulizer solution  0.5 mg Nebulization BID  . citalopram  10 mg Oral Daily  . folic acid  1 mg Oral Daily  . insulin aspart  0-5 Units Subcutaneous QHS  . insulin aspart  0-9 Units Subcutaneous TID WC  . iron polysaccharides  150 mg Oral BID  . metoprolol tartrate  25 mg Oral BID  . pantoprazole  40 mg Oral BID  . sodium chloride  3 mL Intravenous Q12H  . zolpidem  5 mg Oral QHS   Continuous Infusions: . sodium chloride 20 mL/hr (12/06/12 0503)    Active Problems:   HYPERLIPIDEMIA   HYPERTENSION   ABNORMAL HEART RHYTHMS   ALLERGIC RHINITIS   COPD (chronic obstructive pulmonary disease)   Anemia   Diastolic HF (heart failure)   CAD (coronary artery disease)   AAA (abdominal aortic aneurysm)   Acute-on-chronic respiratory failure    Time spent: 30    Lynnix Schoneman C  [facility] Pager [phone] If 8PM-8AM, please contact night-coverage at [email or web address], password [email or web address] 12/06/2012, 1:24 PM  LOS: 2 days

## 2012-12-06 NOTE — OR Nursing (Signed)
Pt has documented allergy to Demerol, but has received Fentanyl in the past for endoscopy procedures with no problem, per [name].  [name] requested pt receive Fentanyl and Versed for sedation for scheduled EGD.  Will continue to monitor pt.   Ennis Forts, RN

## 2012-12-06 NOTE — Progress Notes (Signed)
OT Cancellation Note  Patient Details Name: [name] MRN: [id number] DOB: 1941/02/28   Cancelled Treatment:    Reason Eval/Treat Not Completed: Other (comment) Pt has slept poorly and is scheduled for an EGD this pm.  Will return tomorrow at pt's request.  Sigifredo Pignato 12/06/2012, 3:20 PM Marica Otter, OTR/L [phone] 12/06/2012

## 2012-12-07 ENCOUNTER — Encounter (HOSPITAL_COMMUNITY): Payer: Self-pay | Admitting: Gastroenterology

## 2012-12-07 LAB — CBC
Hemoglobin: 9 g/dL — ABNORMAL LOW (ref 13.0–17.0)
MCH: 23.8 pg — ABNORMAL LOW (ref 26.0–34.0)
MCHC: 28.8 g/dL — ABNORMAL LOW (ref 30.0–36.0)
MCV: 82.8 fL (ref 78.0–100.0)
Platelets: 233 10*3/uL (ref 150–400)
RBC: 3.78 MIL/uL — ABNORMAL LOW (ref 4.22–5.81)

## 2012-12-07 LAB — BASIC METABOLIC PANEL
CO2: 28 mEq/L (ref 19–32)
Calcium: 8.2 mg/dL — ABNORMAL LOW (ref 8.4–10.5)
Creatinine, Ser: 0.82 mg/dL (ref 0.50–1.35)
Glucose, Bld: 167 mg/dL — ABNORMAL HIGH (ref 70–99)

## 2012-12-07 LAB — GLUCOSE, CAPILLARY
Glucose-Capillary: 174 mg/dL — ABNORMAL HIGH (ref 70–99)
Glucose-Capillary: 179 mg/dL — ABNORMAL HIGH (ref 70–99)
Glucose-Capillary: 249 mg/dL — ABNORMAL HIGH (ref 70–99)

## 2012-12-07 MED ORDER — SODIUM CHLORIDE 0.9 % IV SOLN
1020.0000 mg | Freq: Once | INTRAVENOUS | Status: AC
  Administered 2012-12-07: 1020 mg via INTRAVENOUS
  Filled 2012-12-07: qty 34

## 2012-12-07 MED ORDER — ZOLPIDEM TARTRATE 5 MG PO TABS
5.0000 mg | ORAL_TABLET | Freq: Every evening | ORAL | Status: DC | PRN
  Filled 2012-12-07: qty 1

## 2012-12-07 NOTE — Progress Notes (Signed)
[facility] PROGRESS NOTE  [name]:[id number] DOB: October 20, 1940 DOA: 12/04/2012 PCP: [name], MD  Assessment/Plan:  Acute on chronic hypoxic respiratory failure likely due to COPD versus versus acute diastolic CHF given elevated BNP and increased LEE.  symptomatic anemia also contributing.  - will continue nebs abd supplemental O2  -Much clinically improved -diastolic chf improved with prn lasix Symptomatic Iron def anemia/UGIB- 2/2 gastric polyps per GI -s/p EGD 2/20 per  GI/ DrHung with polyps>>removed, per gi likely source though not bleeding -hgb improved s/p transfusion and reamins stable -Fe is 18 and ferritn is 7-  IV iron/feraheme today Syncope, per history sounds related to hypoxia when he tries to ambulate without his oxygen vs 2/2 to above - Encouraged oxygen  - pt is not Orthostatic per vitals - troponins so far neg -ECHO only with mild possible mild AS - Not likely to be the cause of the syncope Elevated BNP/Acute diastolic CHF:  - ECHO with EF 81-19% as above, follow and further diurese as needed -remains compensated at this time, continue to monitor fluid status Asymmetric lower extremity swelling  - Duplex lower extremities  Neg for DVT CAD, stable, Continue home meds -CE neg, follow echo  HTN, BP stable.  -Continue home meds  T2DM: Hold home meds. SSI   Code Status: full code  Family Communication: spoke with patient alone  Disposition Plan: likely home when medically ready   Consultants:  GI - awaiting eval  Procedures:  EGD- gastric polyps  ECHO Study Conclusions  - Left ventricle: The cavity size was normal. Wall thickness was increased in a pattern of mild LVH. Systolic function was vigorous. The estimated ejection fraction was in the range of 65% to 70%. Regional wall motion abnormalities cannot be excluded. Doppler parameters are consistent with abnormal left ventricular relaxation (grade 1 diastolic dysfunction). - Left  atrium: The atrium was mildly dilated. - Pericardium, extracardiac: A trivial pericardial effusion was identified. Impressions:  - Elevated LVOT gradient of 2.8 m/s (mean gradient of 14 mmHg) possibly related to vigorous LV function; aortic valve visually appears to open well; cannot R/O mild AS. Transthoracic echocardiography  Antibiotics:  none  HPI/Subjective: Ambulated with PT w/o difficulty, wants his diet advanced. States he does not feel comfortable going home today. Objective: Filed Vitals:   12/06/12 2028 12/06/12 2313 12/07/12 0459 12/07/12 0848  BP:  144/81 136/86   Pulse:  75 84   Temp:  97.5 F (36.4 C) 97.4 F (36.3 C)   TempSrc:  Oral Oral   Resp:  16    Height:      Weight:   120.838 kg (266 lb 6.4 oz)   SpO2: 96% 97% 96% 94%    Intake/Output Summary (Last 24 hours) at 12/07/12 1125 Last data filed at 12/07/12 1478  Gross per 24 hour  Intake    243 ml  Output   1352 ml  Net  -1109 ml   Filed Weights   12/05/12 0434 12/06/12 0545 12/07/12 0459  Weight: 120 kg (264 lb 8.8 oz) 120.1 kg (264 lb 12.4 oz) 120.838 kg (266 lb 6.4 oz)    Exam:   General:  Obese elderly male in NAD  Cardiovascular: RRR, nl SIS2  Respiratory: clear, moderate air mov't, no wheezes, non labored resp.  Abdomen: soft+BS, NT/ND  EXTREMITIES: trace edema in RLE(chronic), no cyanosis  Data Reviewed: Basic Metabolic Panel:  Recent Labs Lab 12/04/12 1335 12/05/12 0515 12/07/12 0446  NA 140 135 140  K  3.8 3.5 4.2  CL 102 97 102  CO2 29 26 28   GLUCOSE 217* 216* 167*  BUN 10 11 9   CREATININE 0.82 0.79 0.82  CALCIUM 8.7 8.3* 8.2*   Liver Function Tests:  Recent Labs Lab 12/04/12 1335  AST 11  ALT 9  ALKPHOS 129*  BILITOT 0.3  PROT 6.8  ALBUMIN 3.2*   No results found for this basename: LIPASE, AMYLASE,  in the last 168 hours No results found for this basename: AMMONIA,  in the last 168 hours CBC:  Recent Labs Lab 12/04/12 1335 12/05/12 0515  12/06/12 1415 12/07/12 0446  WBC 9.7 8.6 9.3 7.8  NEUTROABS 7.2  --   --   --   HGB 7.3* 7.0* 8.9* 9.0*  HCT 26.7* 25.7* 30.4* 31.3*  MCV 81.2 79.8 80.6 82.8  PLT 257 266 233 233   Cardiac Enzymes:  Recent Labs Lab 12/04/12 1830 12/04/12 2310 12/05/12 0515  TROPONINI <0.30 <0.30 <0.30   BNP (last 3 results)  Recent Labs  05/17/12 1030 12/04/12 1335  PROBNP 375.0* 1065.0*   CBG:  Recent Labs Lab 12/06/12 0737 12/06/12 1220 12/06/12 1754 12/06/12 2306 12/07/12 0758  GLUCAP 226* 191* 153* 164* 174*    No results found for this or any previous visit (from the past 240 hour(s)).   Studies: No results found.  Scheduled Meds: . albuterol  2.5 mg Nebulization TID   And  . ipratropium  0.5 mg Nebulization TID  . ALPRAZolam  0.25 mg Oral Once  . antiseptic oral rinse  15 mL Mouth Rinse BID  . atorvastatin  40 mg Oral q1800  . budesonide (PULMICORT) nebulizer solution  0.5 mg Nebulization BID  . citalopram  10 mg Oral Daily  . ferumoxytol  1,020 mg Intravenous Once  . folic acid  1 mg Oral Daily  . insulin aspart  0-5 Units Subcutaneous QHS  . insulin aspart  0-9 Units Subcutaneous TID WC  . iron polysaccharides  150 mg Oral BID  . metoprolol tartrate  25 mg Oral BID  . pantoprazole  40 mg Oral BID  . sodium chloride  3 mL Intravenous Q12H  . zolpidem  5 mg Oral QHS   Continuous Infusions:    Active Problems:   HYPERLIPIDEMIA   HYPERTENSION   ABNORMAL HEART RHYTHMS   ALLERGIC RHINITIS   COPD (chronic obstructive pulmonary disease)   Anemia   Diastolic HF (heart failure)   CAD (coronary artery disease)   AAA (abdominal aortic aneurysm)   Acute-on-chronic respiratory failure    Time spent: 30    Rethel Sebek C  [facility] Pager [phone] If 8PM-8AM, please contact night-coverage at [email or web address], password [facility] 12/07/2012, 11:25 AM  LOS: 3 days

## 2012-12-07 NOTE — Evaluation (Addendum)
Physical Therapy Evaluation Patient Details Name: [name] MRN: [id number] DOB: 05-21-1941 Today's Date: 12/07/2012 Time: 1478-2956 PT Time Calculation (min): 22 min  PT Assessment / Plan / Recommendation Clinical Impression  Pt presents with AAA, s/p EGD with polyp removal.  Note history of COPD and is on home O2.  Tolerated OOB and ambulation in hallway with no AD at min/guard assist for safety.   Noted no LOB, however pt mildly unstable and states that his hips hurt with ambulation.  Ambulated on 3L O2 with SaO2 at 86% upon returning to room.  cues for pursed lip breathing with SaO2 increasing to 93%. Pt very argumentative, does not respond to safety cues and has a history of non-compliance.  Pt will benefit from skilled PT in acute venue to address deficits.  PT recommends HHPT for follow up at D/C to maximize pts safety and function.     PT Assessment  Patient needs continued PT services    Follow Up Recommendations  Home health PT;Supervision for mobility/OOB    Does the patient have the potential to tolerate intense rehabilitation      Barriers to Discharge None      Equipment Recommendations  None recommended by PT    Recommendations for Other Services     Frequency Min 3X/week    Precautions / Restrictions Precautions Precautions: Fall Precaution Comments: monitor O2 Restrictions Weight Bearing Restrictions: No   Pertinent Vitals/Pain Pt c/o pain all over.  RN notified.       Mobility  Bed Mobility Bed Mobility: Supine to Sit Supine to Sit: 4: Min guard;HOB elevated;With rails Details for Bed Mobility Assistance: Min/guard for safety with cues for safety and technique, however pt does not respond to cues.  Transfers Transfers: Sit to Stand;Stand to Sit Sit to Stand: 4: Min guard;With upper extremity assist;From bed Stand to Sit: 4: Min guard;With upper extremity assist;To bed Details for Transfer Assistance: Min/guard for safety, again with cues for  safety and hand placement.  Ambulation/Gait Ambulation/Gait Assistance: 4: Min guard Ambulation Distance (Feet): 500 Feet Assistive device: None Ambulation/Gait Assistance Details: Cues for safety and pursed lip breathing. No noted LOB, however pt is somewhat unsteady and states that hips hurt him with ambulation.  Stairs: No Wheelchair Mobility Wheelchair Mobility: No    Exercises     PT Diagnosis: Difficulty walking;Generalized weakness;Acute pain  PT Problem List: Decreased strength;Decreased activity tolerance;Decreased balance;Decreased mobility;Decreased coordination;Decreased safety awareness;Decreased knowledge of precautions;Cardiopulmonary status limiting activity;Pain PT Treatment Interventions: DME instruction;Gait training;Stair training;Therapeutic activities;Functional mobility training;Therapeutic exercise;Balance training   PT Goals Acute Rehab PT Goals PT Goal Formulation: With patient Time For Goal Achievement: 12/14/12 Potential to Achieve Goals: Good Pt will go Sit to Stand: with modified independence PT Goal: Sit to Stand - Progress: Goal set today Pt will Ambulate: >150 feet;with modified independence PT Goal: Ambulate - Progress: Goal set today Pt will Go Up / Down Stairs: 3-5 stairs;with supervision;with rail(s) PT Goal: Up/Down Stairs - Progress: Goal set today Pt will Perform Home Exercise Program: with supervision, verbal cues required/provided PT Goal: Perform Home Exercise Program - Progress: Goal set today  Visit Information  Last PT Received On: 12/07/12 Assistance Needed: +1 PT/OT Co-Evaluation/Treatment: Yes    Subjective Data  Subjective: I'm going to fake it when I get up.   Patient Stated Goal: to go home today.    Prior Functioning  Home Living Lives With: Spouse;Friend(s) Type of Home: House Home Access: Stairs to enter [employer] of Steps: 4  Entrance Stairs-Rails: Right;Left Home Layout: One level Bathroom Shower/Tub:  Engineer, manufacturing systems: Standard Home Adaptive Equipment: Walker - rolling;Straight cane;Bedside commode/3-in-1 Prior Function Level of Independence: Needs assistance Needs Assistance: Meal Prep;Light Housekeeping;Bathing Bath: Maximal Dressing: Moderate Meal Prep: Total Light Housekeeping: Total Driving: No Vocation: Retired Comments: Pt stated he hasn't bathed in 5 months....??? States he doesn't want his wife to help him because she's too busy dealing with the horses. Communication Communication: No difficulties Dominant Hand: Right    Cognition  Cognition Overall Cognitive Status: Appears within functional limits for tasks assessed/performed Arousal/Alertness: Awake/alert Orientation Level: Appears intact for tasks assessed Behavior During Session: Agitated    Extremity/Trunk Assessment Right Upper Extremity Assessment RUE ROM/Strength/Tone: [facility] for tasks assessed Left Upper Extremity Assessment LUE ROM/Strength/Tone: WFL for tasks assessed Right Lower Extremity Assessment RLE ROM/Strength/Tone: [facility] for tasks assessed;Deficits RLE ROM/Strength/Tone Deficits: States that B hips hurt with ambulation.  Left Lower Extremity Assessment LLE ROM/Strength/Tone: [facility] for tasks assessed;Deficits LLE ROM/Strength/Tone Deficits: States that B hips hurt with ambulation.  Trunk Assessment Trunk Assessment: Kyphotic   Balance    End of Session PT - End of Session Equipment Utilized During Treatment: Gait belt;Oxygen Activity Tolerance: Treatment limited secondary to agitation Patient left: in bed;with call bell/phone within reach;with bed alarm set Nurse Communication: Mobility status  GP     Vista Deck 12/07/2012, 11:26 AM

## 2012-12-07 NOTE — Progress Notes (Signed)
Patient refusing medications. Educated patient on the importance of taking BP medication. BP 158/89 heart rate 103. Will continue to monitor patient and notify MD.

## 2012-12-07 NOTE — Progress Notes (Addendum)
Patient states he will not take any of his scheduled medications because he should be getting 10 mg of Ambien at bedtime and not 5 mg. Notified NP on call regarding patients non-compliance. Education was given to patient again regarding BP medication and that it is contraindicated to give over 5 mg to patients greater than 71 years of age.  Will continue to monitor.

## 2012-12-07 NOTE — Progress Notes (Signed)
Subjective: No acute events.  He feels much better, especially after eating.  Objective: Vital signs in last 24 hours: Temp:  [97.4 F (36.3 C)-98.3 F (36.8 C)] 97.4 F (36.3 C) (02/21 0459) Pulse Rate:  [73-84] 84 (02/21 0459) Resp:  [12-21] 16 (02/20 2313) BP: (127-148)/(76-109) 136/86 mmHg (02/21 0459) SpO2:  [90 %-98 %] 94 % (02/21 0848) Weight:  [266 lb 6.4 oz (120.838 kg)] 266 lb 6.4 oz (120.838 kg) (02/21 0459) Last BM Date: 12/06/12  Intake/Output from previous day: 02/20 0701 - 02/21 0700 In: 243 [P.O.:240; I.V.:3] Out: 1552 [Urine:1550; Stool:2] Intake/Output this shift:    General appearance: alert and no distress GI: soft, non-tender; bowel sounds normal; no masses,  no organomegaly  Lab Results:  Recent Labs  12/05/12 0515 12/06/12 1415 12/07/12 0446  WBC 8.6 9.3 7.8  HGB 7.0* 8.9* 9.0*  HCT 25.7* 30.4* 31.3*  PLT 266 233 233   BMET  Recent Labs  12/05/12 0515 12/07/12 0446  NA 135 140  K 3.5 4.2  CL 97 102  CO2 26 28  GLUCOSE 216* 167*  BUN 11 9  CREATININE 0.79 0.82  CALCIUM 8.3* 8.2*   LFT No results found for this basename: PROT, ALBUMIN, AST, ALT, ALKPHOS, BILITOT, BILIDIR, IBILI,  in the last 72 hours PT/INR No results found for this basename: LABPROT, INR,  in the last 72 hours Hepatitis Panel No results found for this basename: HEPBSAG, HCVAB, HEPAIGM, HEPBIGM,  in the last 72 hours C-Diff No results found for this basename: CDIFFTOX,  in the last 72 hours Fecal Lactopherrin No results found for this basename: FECLLACTOFRN,  in the last 72 hours  Studies/Results: No results found.  Medications:  Scheduled: . albuterol  2.5 mg Nebulization TID   And  . ipratropium  0.5 mg Nebulization TID  . ALPRAZolam  0.25 mg Oral Once  . antiseptic oral rinse  15 mL Mouth Rinse BID  . atorvastatin  40 mg Oral q1800  . budesonide (PULMICORT) nebulizer solution  0.5 mg Nebulization BID  . citalopram  10 mg Oral Daily  . folic acid  1  mg Oral Daily  . insulin aspart  0-5 Units Subcutaneous QHS  . insulin aspart  0-9 Units Subcutaneous TID WC  . iron polysaccharides  150 mg Oral BID  . metoprolol tartrate  25 mg Oral BID  . pantoprazole  40 mg Oral BID  . sodium chloride  3 mL Intravenous Q12H  . zolpidem  5 mg Oral QHS   Continuous:   Assessment/Plan: 1) Probable upper GI bleed from the gastric polyps. 2) Anemia.   Overall he feels much better.  I think he is bleeding intermittently from the gastric polyps.  I have evaluated him extensively with upper and lower examinations and in the past I was able to identify an active bleeding source in the upper GI tract.  I may have him come back electively to remove the polyps as prophylaxis, but I am not sure if this will be truly beneficial.  Plan: 1) Follow HGB. 2) Transfuse if necessary. 3) Okay to D/C home tomorrow if he feels ready. 4) Signing off. 5) CBC in the office in 2 weeks.   LOS: 3 days   Naraya Stoneberg D 12/07/2012, 1:43 PM

## 2012-12-07 NOTE — Evaluation (Signed)
Occupational Therapy Evaluation Patient Details Name: [name] MRN: [id number] DOB: May 30, 1941 Today's Date: 12/07/2012 Time: 1478-2956 OT Time Calculation (min): 22 min  OT Assessment / Plan / Recommendation Clinical Impression  Pt presents with acute on chronic hypoxic respiratory failure likely due to COPD versus versus acute diastolic CHF. Pt to be seen 1x per week for a trial basis only to address below stated deficits. Pt very argumentative, does not respond to safety cues and has a history of non-compliance.     OT Assessment  Patient needs continued OT Services    Follow Up Recommendations  Home health OT    Barriers to Discharge      Equipment Recommendations  None recommended by OT    Recommendations for Other Services    Frequency  Min 1X/week    Precautions / Restrictions Precautions Precautions: Fall Precaution Comments: monitor O2 Restrictions Weight Bearing Restrictions: No   Pertinent Vitals/Pain Pt states he "hurts all over all the time." RN made aware.    ADL  Grooming: Min guard Where Assessed - Grooming: Unsupported standing Upper Body Bathing: Set up Where Assessed - Upper Body Bathing: Unsupported sitting Lower Body Bathing: Maximal assistance Where Assessed - Lower Body Bathing: Unsupported sit to stand Upper Body Dressing: Set up Where Assessed - Upper Body Dressing: Unsupported sitting Lower Body Dressing: Maximal assistance Where Assessed - Lower Body Dressing: Supported sit to stand Toilet Transfer: Hydrographic surveyor Method: Sit to stand Toileting - Architect and Hygiene: Minimal assistance Where Assessed - Engineer, mining and Hygiene: Sit to stand from 3-in-1 or toilet ADL Comments: Pt refused to use AD for mobility. Ambulated with minguard A and no LOB. Pt states he has problems with dizziness and he is unable to bathe himself at home. When asked why he doesn't spongebathe, pt stated, "because I'm  just a dirty man." Pt with limited activity tolerance even with O2. Pt refused to sit in chair so returned pt to EOB.    OT Diagnosis: Generalized weakness  OT Problem List: Decreased activity tolerance;Decreased safety awareness;Decreased knowledge of use of DME or AE OT Treatment Interventions: Self-care/ADL training;Therapeutic activities;DME and/or AE instruction;Patient/family education;Energy conservation   OT Goals Acute Rehab OT Goals OT Goal Formulation: With patient Time For Goal Achievement: 12/21/12 Potential to Achieve Goals: Fair ADL Goals Pt Will Perform Grooming: with supervision;Standing at sink ADL Goal: Grooming - Progress: Goal set today Pt Will Perform Lower Body Bathing: with min assist;Sit to stand from chair;Sit to stand from bed;with adaptive equipment ADL Goal: Lower Body Bathing - Progress: Goal set today Pt Will Perform Lower Body Dressing: with min assist;Sit to stand from chair;Sit to stand from bed;with adaptive equipment ADL Goal: Lower Body Dressing - Progress: Goal set today Pt Will Transfer to Toilet: with supervision;Ambulation;Comfort height toilet;Regular height toilet ADL Goal: Toilet Transfer - Progress: Goal set today Pt Will Perform Toileting - Clothing Manipulation: with supervision;Standing;Sitting on 3-in-1 or toilet ADL Goal: Toileting - Clothing Manipulation - Progress: Goal set today Pt Will Perform Toileting - Hygiene: with supervision;Sit to stand from 3-in-1/toilet ADL Goal: Toileting - Hygiene - Progress: Goal set today Additional ADL Goal #1: Pt will initiate prn RBS without VCs during selfcare activity. ADL Goal: Additional Goal #1 - Progress: Goal set today  Visit Information  Last OT Received On: 12/07/12 Assistance Needed: +1 PT/OT Co-Evaluation/Treatment: Yes    Subjective Data  Subjective: You don't pay attention very well. Patient Stated Goal: None stated.   Prior  Functioning     Home Living Lives With:  Spouse;Friend(s) Type of Home: House Home Access: Stairs to enter [employer] of Steps: 4 Entrance Stairs-Rails: Right;Left Home Layout: One level Bathroom Shower/Tub: Engineer, manufacturing systems: Standard Home Adaptive Equipment: Walker - rolling;Straight cane;Bedside commode/3-in-1 Prior Function Level of Independence: Needs assistance Needs Assistance: Meal Prep;Light Housekeeping;Bathing Bath: Maximal Dressing: Moderate Meal Prep: Total Light Housekeeping: Total Driving: No Vocation: Retired Comments: Pt stated he hasn't bathed in 5 months....??? States he doesn't want his wife to help him because she's too busy dealing with the horses. Communication Communication: No difficulties Dominant Hand: Right         Vision/Perception     Cognition  Cognition Overall Cognitive Status: Appears within functional limits for tasks assessed/performed Arousal/Alertness: Awake/alert Orientation Level: Appears intact for tasks assessed Behavior During Session: Agitated    Extremity/Trunk Assessment Right Upper Extremity Assessment RUE ROM/Strength/Tone: [facility] for tasks assessed Left Upper Extremity Assessment LUE ROM/Strength/Tone: WFL for tasks assessed Right Lower Extremity Assessment RLE ROM/Strength/Tone: [facility] for tasks assessed;Deficits RLE ROM/Strength/Tone Deficits: States that B hips hurt with ambulation.  Left Lower Extremity Assessment LLE ROM/Strength/Tone: [facility] for tasks assessed;Deficits LLE ROM/Strength/Tone Deficits: States that B hips hurt with ambulation.  Trunk Assessment Trunk Assessment: Kyphotic     Mobility Bed Mobility Bed Mobility: Supine to Sit Supine to Sit: 4: Min guard;HOB elevated;With rails Details for Bed Mobility Assistance: Min/guard for safety with cues for safety and technique, however pt does not respond to cues.  Transfers Transfers: Sit to Stand;Stand to Sit Sit to Stand: 4: Min guard Stand to Sit: 4: Min guard Details  for Transfer Assistance: Pt will not respond to safety cues.     Exercise     Balance     End of Session OT - End of Session Equipment Utilized During Treatment: Gait belt Activity Tolerance: Patient limited by fatigue Patient left: in bed;with call bell/phone within reach;with bed alarm set  GO     [name] A OTR/L [phone] 12/07/2012, 11:21 AM

## 2012-12-07 NOTE — Progress Notes (Signed)
Pt refuses phyiscal therapy, states due to being on clear liquids. Refuses assitance to bathroom

## 2012-12-08 LAB — TYPE AND SCREEN
ABO/RH(D): B NEG
Antibody Screen: NEGATIVE
Unit division: 0
Unit division: 0

## 2012-12-08 LAB — GLUCOSE, CAPILLARY: Glucose-Capillary: 187 mg/dL — ABNORMAL HIGH (ref 70–99)

## 2012-12-08 LAB — CBC
Platelets: 279 10*3/uL (ref 150–400)
RDW: 18.5 % — ABNORMAL HIGH (ref 11.5–15.5)
WBC: 8.2 10*3/uL (ref 4.0–10.5)

## 2012-12-08 MED ORDER — FUROSEMIDE 20 MG PO TABS: 20.0000 mg | ORAL_TABLET | Freq: Two times a day (BID) | ORAL | Status: DC

## 2012-12-08 MED ORDER — ZOLPIDEM TARTRATE 10 MG PO TABS
10.0000 mg | ORAL_TABLET | Freq: Every evening | ORAL | Status: DC | PRN
  Administered 2012-12-08: 10 mg via ORAL
  Filled 2012-12-08: qty 1

## 2012-12-08 MED ORDER — FUROSEMIDE 20 MG PO TABS: 20.0000 mg | ORAL_TABLET | Freq: Every day | ORAL | Status: DC

## 2012-12-08 MED ORDER — POTASSIUM CHLORIDE ER 10 MEQ PO TBCR: 10.0000 meq | EXTENDED_RELEASE_TABLET | Freq: Every day | ORAL | Status: DC

## 2012-12-08 MED ORDER — MUCINEX DM 30-600 MG PO TB12: 30.0000 mg | ORAL_TABLET | Freq: Two times a day (BID) | ORAL | Status: DC

## 2012-12-08 MED ORDER — ZOLPIDEM TARTRATE 5 MG PO TABS: 5.0000 mg | ORAL_TABLET | Freq: Every evening | ORAL | Status: DC | PRN

## 2012-12-08 NOTE — Progress Notes (Signed)
NP on call wrote new orders for patient. Patient agreeable to medication administration at this time. Renee, RN will now assume care of patient, report has been given.

## 2012-12-08 NOTE — Progress Notes (Signed)
CM spoke with patient concerning discharge planning. Per pt choice [facility] to provide [facility] services upon discharge. Pt to discharge home with spouse. Per pt and PT eval no DME needed. No other needs stated.   Roxy Manns Miia Blanks,RN,BSN [phone]

## 2012-12-08 NOTE — Progress Notes (Signed)
Notified NP patient was having quadrigeminy PVC's. HR in 110's and still refusing medication.  Will continue to monitor.

## 2012-12-08 NOTE — Discharge Summary (Signed)
Physician Discharge Summary  [name] WUJ:[id number] DOB: 25-May-1941 DOA: 12/04/2012  PCP: [name], MD  Admit date: 12/04/2012 Discharge date: 12/08/2012  Time spent: >61minutes  Recommendations for Outpatient Follow-up:  1.   Discharge Diagnoses:  Active Problems:   HYPERLIPIDEMIA   HYPERTENSION   ABNORMAL HEART RHYTHMS   ALLERGIC RHINITIS   COPD (chronic obstructive pulmonary disease)   Anemia   Diastolic HF (heart failure)   CAD (coronary artery disease)   AAA (abdominal aortic aneurysm)   Acute-on-chronic respiratory failure Diabetes mellitus   Discharge Condition: Improved/stable  Diet recommendation: Modified carbohydrate  Filed Weights   12/06/12 0545 12/07/12 0459 12/08/12 0434  Weight: 120.1 kg (264 lb 12.4 oz) 120.838 kg (266 lb 6.4 oz) 120.9 kg (266 lb 8.6 oz)    History of present illness:  The patient is a 72 y.o. 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 About three weeks ago, he had a syncopal episode when he tried to ambulate without his oxygen from his bed to the air conditioner. He stood up, walked a few feet, blacked out and landed on his right knee. Not full LOC and denies head trauma. Felt very short of breath and put his oxygen back on and after several minutes felt better. He has been becoming slowly progressively more SOB. This morning, he got up to turn down his air conditioner again without his oxygen and he had a black out spell. He was unconscious he thinks for a few seconds. He did not hit his head. He felt dizzy and foggy and very short of breath and "had a tremendous time getting air into my lungs even with my oxygen." His primary care doctor told him to come to the emergency department. Denies recent fevers, chills, cough, changes in sputum volume or consistency, increased sinus congestion. Has increased wheeze. Has chronic intermittent atypical chest  pressure and pain, but nothing new today.    Hospital Course:  Acute on chronic hypoxic respiratory failure likely due to COPD versus +/-acute diastolic CHF given elevated BNP and increased LEE. symptomatic anemia also contributing.  As discussed above, upon admission the patient had a chest x-ray done which by dates the atelectasis and his BNP was done and came back elevated at 1065 -He was started on nebulized bronchodilators and maintained on supplemental oxygen -He was diuresed as needed with Lasix -With this interventions he improved clinically and has been oxygenating at  91-97% on 2 L nasal cannula oxygen-his usually on 2-3 L nasal cannula oxygen at home and followed by Dr. Kendrick Fries with pulmonology -His to followup outpatient PCP and with Dr. Kendrick Fries Symptomatic Iron def anemia/UGIB- 2/2 gastric polyps per GI  -Patient reported melena on admission and hemoglobin was found to be 7.3. he was transfused 2 units of packed red blood cells and this  improved to 9.3. GI was consulted and they saw patient and EGD was done on 2/20 -that he was found to have gastric polyps which were removed. Per GI the impression was that this was likely source of the bleeding. Anemia panel was done-Fe is 18 and ferritin is 7 and he was IV iron/feraheme on 2/21. His to continue oral iron supplementation upon discharge. His to followup with his PCP and GI as needed.  Syncope,  -Patient reported that when he took off his oxygen he had a syncopal episode when he got up to a to the  bathroom. Workup included cardiac enzymes which came back negative .orthostatic vitals were done and she was not orthostatic heart. An echocardiogram was done which showed mild aortic stenosis, EF the 65-70% to. The impression was that this was possibly orthostatic syncope secondary to his GI bleed (no orthostatic vitals were within normal limits). He did not have any focal neurologic findings. he remained asymptomatic during this hospital stay, he  is to follow up outpatient.  -Elevated BNP/Acute diastolic CHF:  - See #1 above, ECHO with EF 65-70% as above, was diuresed with Lasix as needed in the hospital. His been discharged on low dose Lasix and is to followup with this PCP for further monitoring of his fluid status and adjustment of his medications as appropriate. Asymmetric lower extremity swelling  -  Duplex lower extremities Neg for DVT  CAD, stable, as above cardiac enzymes were done in the hospital and came back negative. Continue home meds  HTN, BP stable.  -His to continue his outpatient medications upon discharge. DM type 2: His Accu-Cheks were monitored and he was covered with sliding scale insulin while in the hospital. His to continue his outpatient medications and follow up outpatient.   Consultants:  GI - Dr. Nicholes Mango Procedures:  EGD- gastric polyps ECHO  Study Conclusions  - Left ventricle: The cavity size was normal. Wall thickness was increased in a pattern of mild LVH. Systolic function was vigorous. The estimated ejection fraction was in the range of 65% to 70%. Regional wall motion abnormalities cannot be excluded. Doppler parameters are consistent with abnormal left ventricular relaxation (grade 1 diastolic dysfunction). - Left atrium: The atrium was mildly dilated. - Pericardium, extracardiac: A trivial pericardial effusion was identified. Impressions:  - Elevated LVOT gradient of 2.8 m/s (mean gradient of 14 mmHg) possibly related to vigorous LV function; aortic valve visually appears to open well; cannot R/O mild AS. Transthoracic echocardiography      Discharge Exam: Filed Vitals:   12/08/12 0423 12/08/12 0434 12/08/12 0903 12/08/12 1118  BP: 139/83 123/78    Pulse: 85 87    Temp:      TempSrc:      Resp: 19 22    Height:      Weight:  120.9 kg (266 lb 8.6 oz)    SpO2: 96% 97% 97% 91%   Exam:  General: Obese elderly male in NAD  Cardiovascular: RRR, nl SIS2  Respiratory: clear,  moderate air mov't, no wheezes, non labored resp.  Abdomen: soft+BS, NT/ND  EXTREMITIES: trace edema in RLE(chronic), no cyanosis    Discharge Instructions  Discharge Orders   Future Appointments Provider Department Dept Phone   10/21/2013 10:30 AM [facility] [phone]   Eat a light meal the night before the exam but please avoid gaseous foods.   Nothing to eat or drink for at least 8 hours prior to the exam. No gum chewing or smoking the morning of the exam. Please take your morning medications with small sips of water, especially blood pressure medication. If you have several vascular lab exams and will see physician, please bring a snack with you.   10/21/2013 11:00 AM Evern Bio, NP [facility] [phone]   Future Orders Complete By Expires     Diet Carb Modified  As directed     Increase activity slowly  As directed         Medication List    TAKE these medications  ALPRAZolam 0.25 MG tablet  Commonly known as:  XANAX  Take 0.25 mg by mouth 3 (three) times daily as needed for anxiety. anxiety     aspirin 81 MG chewable tablet  Chew 81 mg by mouth daily. Resume when it is okay by Dr. hung     atorvastatin 80 MG tablet  Commonly known as:  LIPITOR  Take 40 mg by mouth daily.     budesonide-formoterol 160-4.5 MCG/ACT inhaler  Commonly known as:  SYMBICORT  Inhale 2 puffs into the lungs 2 (two) times daily.     citalopram 10 MG tablet  Commonly known as:  CELEXA  Take 10 mg by mouth daily.     folic acid 1 MG tablet  Commonly known as:  FOLVITE  Take 1 mg by mouth daily.     furosemide 20 MG tablet  Commonly known as:  LASIX  Take 1 tablet (20 mg total) by mouth daily.     HYDROcodone-acetaminophen 5-500 MG per tablet  Commonly known as:  VICODIN  Take 1 tablet by mouth every 4 (four) hours as needed for pain.     iron polysaccharides 150 MG capsule  Commonly known as:   NIFEREX  Take 150 mg by mouth 2 (two) times daily.     metFORMIN 500 MG tablet  Commonly known as:  GLUCOPHAGE  Take 1 tablet (500 mg total) by mouth 2 (two) times daily with a meal.     metoprolol tartrate 25 MG tablet  Commonly known as:  LOPRESSOR  Take 25 mg by mouth 2 (two) times daily.     MUCINEX DM 30-600 MG Tb12  Take 30-600 mg by mouth 2 (two) times daily.     pantoprazole 40 MG tablet  Commonly known as:  PROTONIX  Take 40 mg by mouth 2 (two) times daily.     potassium chloride 10 MEQ tablet  Commonly known as:  K-DUR  Take 1 tablet (10 mEq total) by mouth daily.     PROVENTIL HFA 108 (90 BASE) MCG/ACT inhaler  Generic drug:  albuterol  Inhale 2 puffs into the lungs every 4 (four) hours as needed for shortness of breath. Shortness of breath     zolpidem 10 MG tablet  Commonly known as:  AMBIEN  Take 10 mg by mouth at bedtime.          The results of significant diagnostics from this hospitalization (including imaging, microbiology, ancillary and laboratory) are listed below for reference.    Significant Diagnostic Studies: Dg Chest 1 View  12/04/2012  *RADIOLOGY REPORT*  Clinical Data: Shortness of breath.  CHEST - 1 VIEW  Comparison: 11/06/2012.  Findings: The cardiac silhouette, mediastinal and hilar contours are within normal limits and stable.  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  Bibasilar atelectasis is noted.  No pleural effusion or pulmonary edema.  Left apical bulla is stable.  The bony thorax is intact.  IMPRESSION:  1.  Bibasilar atelectasis. 2.  No infiltrates, edema or effusions.   Original Report Authenticated By: Rudie Meyer, M.D.    Dg Knee Complete 4 Views Right  12/04/2012  *RADIOLOGY REPORT*  Clinical Data: Right knee pain  RIGHT KNEE - COMPLETE 4+ VIEW  Comparison: None  Findings: The joint spaces are maintained.  No acute fracture or osteochondral lesion.  No joint effusion.  IMPRESSION: No acute bony findings or joint effusion.    Original Report Authenticated By: Rudie Meyer, M.D.     Microbiology: No results found for  this or any previous visit (from the past 240 hour(s)).   Labs: Basic Metabolic Panel:  Recent Labs Lab 12/04/12 1335 12/05/12 0515 12/07/12 0446  NA 140 135 140  K 3.8 3.5 4.2  CL 102 97 102  CO2 29 26 28   GLUCOSE 217* 216* 167*  BUN 10 11 9   CREATININE 0.82 0.79 0.82  CALCIUM 8.7 8.3* 8.2*   Liver Function Tests:  Recent Labs Lab 12/04/12 1335  AST 11  ALT 9  ALKPHOS 129*  BILITOT 0.3  PROT 6.8  ALBUMIN 3.2*   No results found for this basename: LIPASE, AMYLASE,  in the last 168 hours No results found for this basename: AMMONIA,  in the last 168 hours CBC:  Recent Labs Lab 12/04/12 1335 12/05/12 0515 12/06/12 1415 12/07/12 0446 12/08/12 0535  WBC 9.7 8.6 9.3 7.8 8.2  NEUTROABS 7.2  --   --   --   --   HGB 7.3* 7.0* 8.9* 9.0* 9.4*  HCT 26.7* 25.7* 30.4* 31.3* 32.8*  MCV 81.2 79.8 80.6 82.8 83.5  PLT 257 266 233 233 279   Cardiac Enzymes:  Recent Labs Lab 12/04/12 1830 12/04/12 2310 12/05/12 0515  TROPONINI <0.30 <0.30 <0.30   BNP: BNP (last 3 results)  Recent Labs  05/17/12 1030 12/04/12 1335  PROBNP 375.0* 1065.0*   CBG:  Recent Labs Lab 12/07/12 0758 12/07/12 1125 12/07/12 1656 12/07/12 2145 12/08/12 0723  GLUCAP 174* 179* 163* 249* 187*       Signed:  Tyshun Tuckerman C  [facility] 12/08/2012, 11:51 AM

## 2012-12-17 ENCOUNTER — Other Ambulatory Visit: Payer: Self-pay | Admitting: Family Medicine

## 2012-12-17 NOTE — Telephone Encounter (Signed)
[facility]ic[facility] 

## 2012-12-19 IMAGING — NM NM GI BLOOD LOSS
2 series · 12 of 12 positions shown · non-contrast
Comparison: CT abdomen pelvis - 12/22/2009

CLINICAL DATA: Recurrent microcytic anemia, history of multiple
AVMs, no recent bloody stools, remote history of tarry stools.

NUCLEAR MEDICINE GASTROINTESTINAL BLEEDING SCAN
TECHNIQUE: Sequential abdominal images were obtained following
intravenous administration of Tc-11m labeled red blood cells.
Radiopharmaceutical: [id number] TIGER ULTRATAG TECHNETIUM TC 99M-
LABELED RED BLOOD CELLS IV KIT

[Series 1: gi gi bleed · 4.72mm/px · 6 of 60 frames shown (1 of 2)]
[frame 6/60]
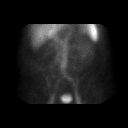
[frame 16/60]
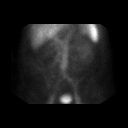
[frame 26/60]
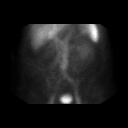
[frame 36/60]
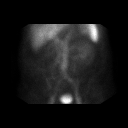
[frame 46/60]
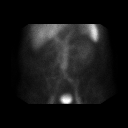
[frame 56/60]
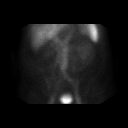

[Series 1: gi gi bleed · 4.75mm/px · 6 of 60 frames shown (2 of 2)]
[frame 6/60]
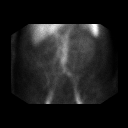
[frame 16/60]
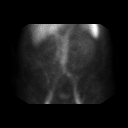
[frame 26/60]
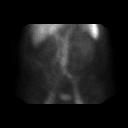
[frame 36/60]
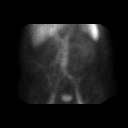
[frame 46/60]
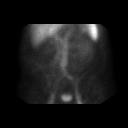
[frame 56/60]
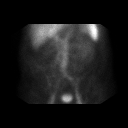

[12 of 12 positions shown; findings below may reference images not displayed]

FINDINGS: Anterior projection planar images of the upper abdomen demonstrates
physiologic activity within the liver, spleen, abdominal aorta with
excreted radiotracer within the urinary bladder.

There is no definite scintigraphic evidence of acute GI bleed.
IMPRESSION: No scintigraphic evidence of acute GI bleed.

## 2012-12-19 IMAGING — CR DG CHEST 1V PORT
1 series · 1 of 1 positions shown · non-contrast
Comparison: Most recent prior chest xray 03/27/2012, recent prior
chest CT 12/22/2009

CLINICAL DATA: Shortness of breath, weakness, history COPD

PORTABLE CHEST - 1 VIEW

[AP]
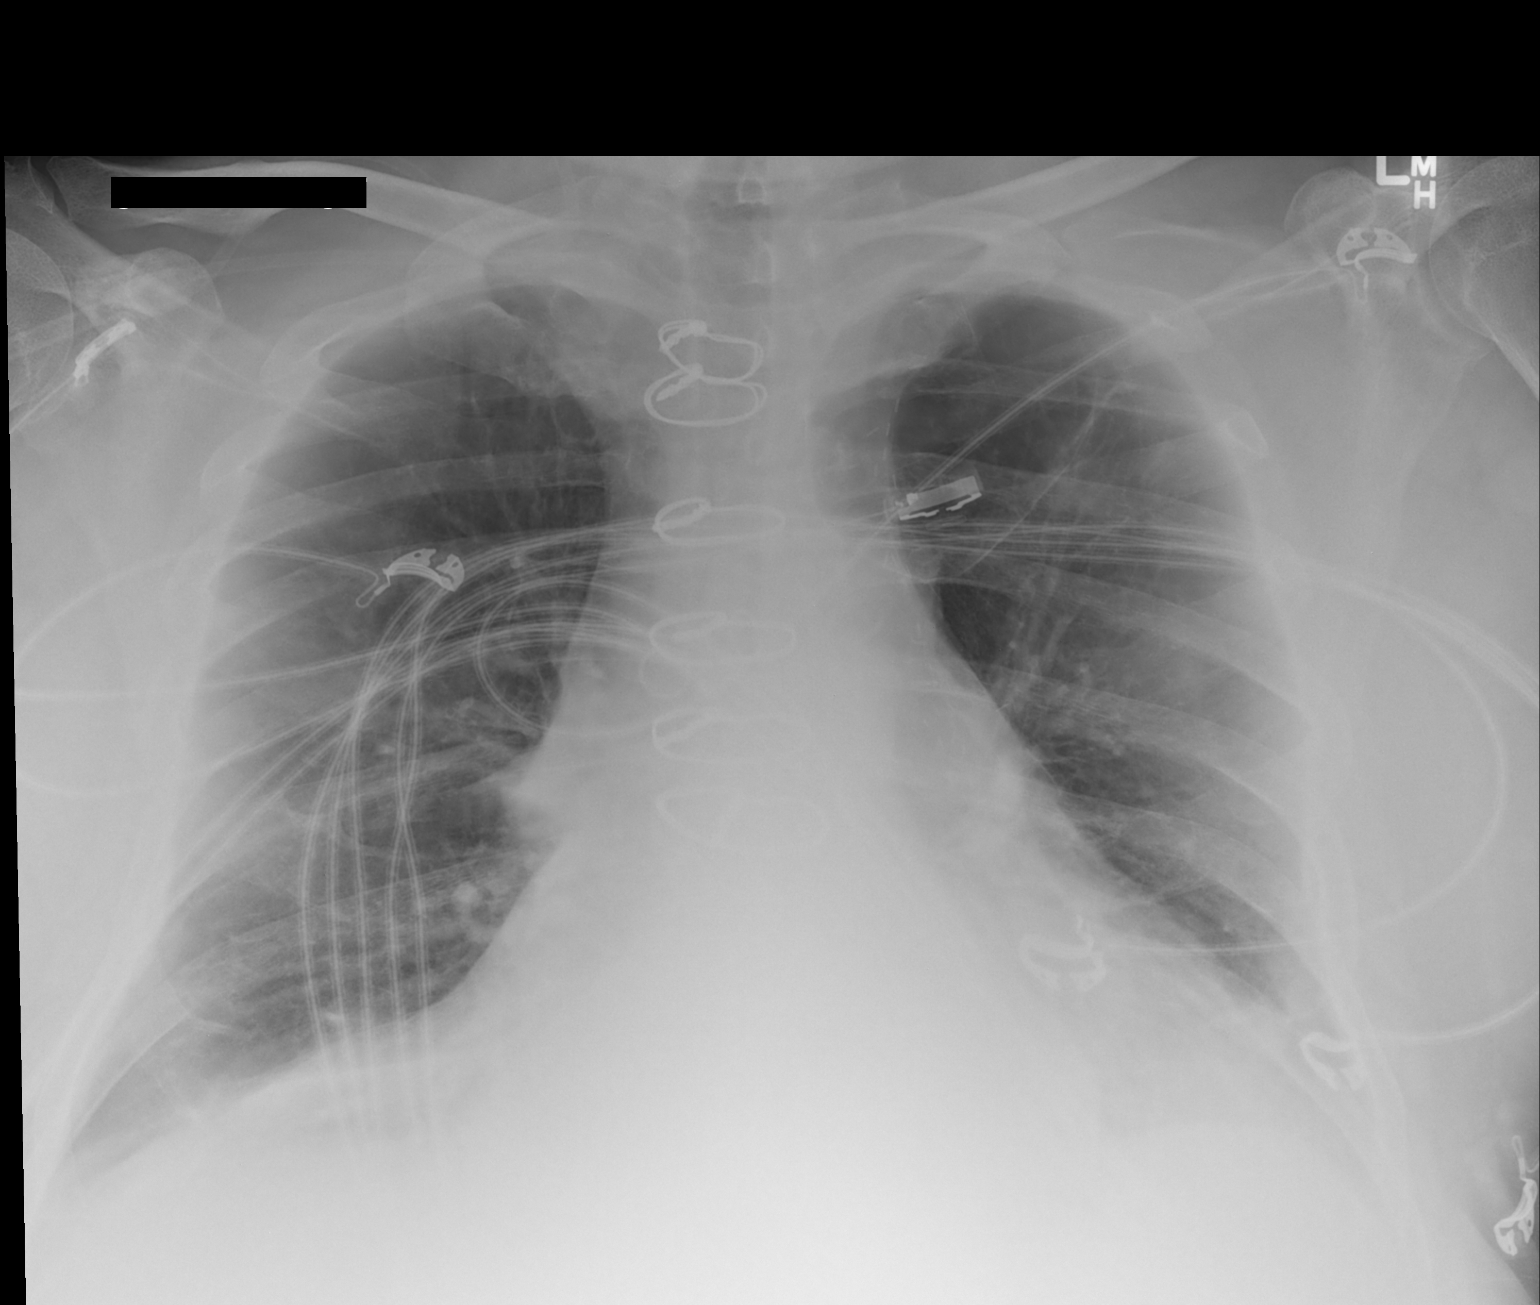

[1 of 1 positions shown; findings below may reference images not displayed]

FINDINGS: Surgical changes of prior median sternotomy with evidence
of multivessel CABG including [REDACTED] bypass.  The cardiac and
mediastinal contours are unchanged.  Moderately large hiatal
hernia.  Inspiratory volumes are low and are nonspecific ill-
defined bibasilar opacities which may reflect atelectasis.
Background emphysema with bullous change in the left apex,
unchanged.  No pneumothorax.
IMPRESSION: 1.  Low inspiratory volumes with bibasilar opacities favored to
reflect atelectasis. Otherwise, no acute cardiopulmonary disease.
2.  Emphysema with bullous change in the left apex.  No
pneumothorax identified.
3.  Moderately large hiatal hernia

## 2012-12-25 ENCOUNTER — Telehealth: Payer: Self-pay

## 2012-12-25 NOTE — Telephone Encounter (Signed)
Patient has already picked up the 5-300mg 

## 2012-12-25 NOTE — Telephone Encounter (Signed)
Prior Berkley Harvey is needed for hydrocodone acet 5-300.  Insurance will cover the same medicine at 5-325.  Do you want to change dose?

## 2012-12-25 NOTE — Telephone Encounter (Signed)
Yes please change to 5-325.

## 2012-12-25 NOTE — Telephone Encounter (Signed)
Enrique Sack with [employer] left v/m requesting call back for prior auth hydrocodone apap.

## 2012-12-27 ENCOUNTER — Ambulatory Visit: Payer: Medicare Other | Admitting: Family Medicine

## 2012-12-28 ENCOUNTER — Other Ambulatory Visit: Payer: Self-pay | Admitting: Family Medicine

## 2012-12-30 ENCOUNTER — Other Ambulatory Visit: Payer: Self-pay | Admitting: Family Medicine

## 2012-12-31 NOTE — Telephone Encounter (Signed)
[facility] 

## 2013-01-07 ENCOUNTER — Other Ambulatory Visit: Payer: Self-pay

## 2013-01-07 NOTE — Telephone Encounter (Signed)
Pt's wife called for refill on pantoprazole 40 mg pt [facility]; [facility] Catoe [facility] because pt is out of med. Pt' wife said pt has had multiple stomach surgeries and needs med twice a day.Pt also has refill for citalopram 10 mg taking one daily coming from mail order pharmacy and request  # 15 sent to [facility] until pt receives mail order med. Pt's wife request call back when all refills done. (could not add local pharmacy request refills due to only listing one pharmacy at a time in Epic).

## 2013-01-07 NOTE — Telephone Encounter (Signed)
That's a very high dose of protonix.  If he is taking that much, GI should be prescribing this.  Ok to refill once, then further refills need to be through GI at this high dose.

## 2013-01-07 NOTE — Telephone Encounter (Signed)
Left message asking that pt's wife call back to discuss.

## 2013-01-11 ENCOUNTER — Telehealth: Payer: Self-pay | Admitting: Pulmonary Disease

## 2013-01-11 ENCOUNTER — Telehealth: Payer: Self-pay | Admitting: Family Medicine

## 2013-01-11 NOTE — Telephone Encounter (Signed)
Will forward to Dr. McQuaid as FYI 

## 2013-01-11 NOTE — Telephone Encounter (Signed)
Please call in the morning on 3/31 to make sure he plans to come for the appt

## 2013-01-11 NOTE — Telephone Encounter (Signed)
Patient Information:  Caller Name: Kieth  Phone: [phone]  Patient: [name], [name]  Gender: Male  DOB: [birth year]  Age: 72 Years  PCP: [name] [facility])  Office Follow Up:  Does the office need to follow up with this patient?: Yes  Instructions For The Office: OFFICE, PLEASE BE AWARE PT IS DEPRESSED AND CONCERNED THAT HEMOGLOBIN IS LOW.  RN TRIED TO GET PT TO COME INTO THE OFFICE TODAY TO BE SEEN BUT PT REFUSED.  RN DID SCHEDULE APPT FOR 01/14/13 WITH DR Dayton Martes AT 1200  RN Note:  pt states the celexa is not helping.  Pt states he has not been taking medications except (ambien, hydrocodone and xanax) for the past 4 days.  Pt is worried about lab tests being low. RN tried to get patient to come to the office today, but pt refused the appointment.  Rn tried to get patient to call someone to talk with about feelings, but pt declined.  Symptoms  Reason For Call & Symptoms: pt reports he has more symptoms of depression.  Pt states " I have lost the will to live"  Pt states he has been through so much and not improving.  Pt states he has been in and out of the hospital 5 times in the past couple of months.  Reviewed Health History In EMR: Yes  Reviewed Medications In EMR: Yes  Reviewed Allergies In EMR: Yes  Reviewed Surgeries / Procedures: Yes  Date of Onset of Symptoms: Unknown  Guideline(s) Used:  Depression  Disposition Per Guideline:   Call Local Agency Now  Reason For Disposition Reached:   Patient sounds very upset or severely depressed (e.g., multiple symptoms of depression)  Advice Given:  N/A  Patient Refused Recommendation:  Patient Will Follow Up With Office Later  pt wanted to wait to talk with Dr Dayton Martes.. Rn tried to get appt for today with another provider but pt refused

## 2013-01-14 ENCOUNTER — Encounter: Payer: Self-pay | Admitting: Family Medicine

## 2013-01-14 ENCOUNTER — Ambulatory Visit (INDEPENDENT_AMBULATORY_CARE_PROVIDER_SITE_OTHER): Payer: Medicare Other | Admitting: Family Medicine

## 2013-01-14 VITALS — BP 150/70 | HR 72 | Temp 97.8°F | Wt 262.0 lb

## 2013-01-14 DIAGNOSIS — F329 Major depressive disorder, single episode, unspecified: Secondary | ICD-10-CM

## 2013-01-14 DIAGNOSIS — F418 Other specified anxiety disorders: Secondary | ICD-10-CM

## 2013-01-14 DIAGNOSIS — D509 Iron deficiency anemia, unspecified: Secondary | ICD-10-CM

## 2013-01-14 DIAGNOSIS — F3289 Other specified depressive episodes: Secondary | ICD-10-CM

## 2013-01-14 DIAGNOSIS — K922 Gastrointestinal hemorrhage, unspecified: Secondary | ICD-10-CM

## 2013-01-14 HISTORY — DX: Other specified anxiety disorders: F41.8

## 2013-01-14 LAB — FERRITIN: Ferritin: 47.1 ng/mL (ref 22.0–322.0)

## 2013-01-14 LAB — CBC WITH DIFFERENTIAL/PLATELET
Eosinophils Relative: 2.3 % (ref 0.0–5.0)
HCT: 30.3 % — ABNORMAL LOW (ref 39.0–52.0)
Lymphocytes Relative: 19 % (ref 12.0–46.0)
Lymphs Abs: 1.6 10*3/uL (ref 0.7–4.0)
Monocytes Relative: 6.4 % (ref 3.0–12.0)
Neutrophils Relative %: 71.8 % (ref 43.0–77.0)
Platelets: 226 10*3/uL (ref 150.0–400.0)
WBC: 8.5 10*3/uL (ref 4.5–10.5)

## 2013-01-14 MED ORDER — CITALOPRAM HYDROBROMIDE 20 MG PO TABS: 10.0000 mg | ORAL_TABLET | Freq: Every day | ORAL | Status: DC

## 2013-01-14 NOTE — Patient Instructions (Addendum)
Good to see you.  Hang in there.  Please restart your medications immediately.  Please double up on your citalopram (celexa)- 20 mg daily. Please call me in 2 weeks.  Please make an appointment with [name].
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
## 2013-01-14 NOTE — Telephone Encounter (Signed)
Spoke with patient's wife, she asked patient and he said he does plan to come in for appt.

## 2013-01-14 NOTE — Telephone Encounter (Signed)
Pt has discussed this with Dr Dayton Martes.

## 2013-01-15 ENCOUNTER — Encounter: Payer: Self-pay | Admitting: Family Medicine

## 2013-01-23 ENCOUNTER — Telehealth: Payer: Self-pay | Admitting: Family Medicine

## 2013-01-23 NOTE — Telephone Encounter (Signed)
Patient Information:  Caller Name: [place]  Phone: [phone]  Patient: [name], [name]  Gender: Male  DOB: [birth year]  Age: 72 Years  PCP: [name])  Office Follow Up:  Does the office need to follow up with this patient?: No  Instructions For The Office: N/A   Symptoms  Reason For Call & Symptoms: Cold URI started yesterday 4/8, has sore throat, head congestion, congested cough   - when coughs lungs burn/hurt, coughing up white mucus.  Sore throat and cough worse today 4/9.  Says will only see Dr Jovita Gamma, has Appt tomorrow 4/10 at noon.  Reviewed Health History In EMR: Yes  Reviewed Medications In EMR: Yes  Reviewed Allergies In EMR: Yes  Reviewed Surgeries / Procedures: Yes  Date of Onset of Symptoms: 01/22/2013  Treatments Tried: Mucinex, continuous oxygen  Treatments Tried Worked: Yes  Guideline(s) Used:  Cough  Sore Throat  Disposition Per Guideline:   See Today in Office  Reason For Disposition Reached:   Diabetes mellitus or weak immune system (e.g., HIV positive, cancer chemo, splenectomy, organ transplant, chronic steroids)  Advice Given:  Liquids:  Adequate liquid intake is important to prevent dehydration. Drink 6-8 glasses of water per day.  Soft Diet:   Cold drinks and milk shakes are especially good (Reason: swollen tonsils can make some foods hard to swallow).  For Relief of Sore Throat Pain:  Sip warm chicken broth or apple juice.  Suck on hard candy or a throat lozenge (over-the-counter).  Gargle warm salt water 3 times daily (1 teaspoon of salt in 8 oz or 240 ml of warm water).  Call Back If:  You become worse.  Patient Refused Recommendation:  Patient Will Follow Up With Office Later  Has Appt tomorrow 4/10 at noon

## 2013-01-24 ENCOUNTER — Ambulatory Visit: Payer: Medicare Other | Admitting: Family Medicine

## 2013-01-29 ENCOUNTER — Other Ambulatory Visit: Payer: Self-pay | Admitting: Family Medicine

## 2013-01-29 NOTE — Telephone Encounter (Signed)
Medicine called to pharmacy. 

## 2013-01-31 ENCOUNTER — Encounter: Payer: Self-pay | Admitting: Family Medicine

## 2013-01-31 ENCOUNTER — Other Ambulatory Visit: Payer: Self-pay | Admitting: Pulmonary Disease

## 2013-01-31 ENCOUNTER — Ambulatory Visit (INDEPENDENT_AMBULATORY_CARE_PROVIDER_SITE_OTHER): Payer: Medicare Other | Admitting: Family Medicine

## 2013-01-31 VITALS — BP 150/90 | HR 65 | Temp 97.4°F | Wt 254.0 lb

## 2013-01-31 DIAGNOSIS — J449 Chronic obstructive pulmonary disease, unspecified: Secondary | ICD-10-CM

## 2013-01-31 MED ORDER — HYDROCODONE-HOMATROPINE 5-1.5 MG/5ML PO SYRP: 5.0000 mL | ORAL_SOLUTION | Freq: Three times a day (TID) | ORAL | Status: DC | PRN

## 2013-01-31 MED ORDER — LEVOFLOXACIN 500 MG PO TABS: 500.0000 mg | ORAL_TABLET | Freq: Every day | ORAL | Status: DC

## 2013-01-31 NOTE — Patient Instructions (Addendum)
Good to see you. I hope you feel better.  Please take Levaquin -1 tablet daily for 7 days.  Hycodan as needed for cough.

## 2013-01-31 NOTE — Progress Notes (Signed)
Subjective:    Patient ID: [name], male    DOB: 1940/11/03, 72 y.o.   MRN: [id number]  72 yo male with h/o severe COPD followed by pulmonary on chronic O2 here for cough, congestion, SOB x 10 days. No fevers. Taking Mucinex which is not helping.  Several recent hospital admissions for GI bleed and COPD exacerbations.   Past Medical History  Diagnosis Date  . Allergic rhinitis, cause unspecified   . Other and unspecified hyperlipidemia   . 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 Unspecified essential hypertension   . 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 Severe bullous disease of LUL. Prolonged intubation after surgeries due to COPD  . 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 Anemia     Recurrent microcytic, presumably GI   . Complication of anesthesia     trouble getting off ventilator  . Memory loss   . Diabetes      Review of Systems  See HPI No fevers      Objective:   Physical Exam BP 150/90  Pulse 65  Temp(Src) 97.4 F (36.3 C)  Wt 254 lb (115.214 kg)  BMI 31.75 kg/m2  SpO2 95%   Filed Vitals:   01/31/13 1020  BP: 150/90  Pulse: 65  Temp: 97.4 F (36.3 C)  Weight: 254 lb (115.214 kg)  SpO2: 95%  O2 2.5 L  Gen: chronically ill appearing, no acute distress HEENT: NCAT, PERRL, EOMi, OP clear, neck supple without masses PULM: No wheezes, does have bibasilar crackles and rhonchi CV: RRR, no mgr, no JVD AB: BS+, soft, nontender, no hsm Ext: warm, trace edema, no clubbing, no cyanosis Derm: macular, erythematous, keratotic rash R arm     Assessment & Plan:   No problem-specific assessment & plan notes found for this encounter.   Updated Medication List Outpatient Encounter Prescriptions as of 01/31/2013  Medication Sig Dispense Refill  . ALPRAZolam (XANAX) 0.25 MG tablet  TAKE 1 TABLET BY MOUTH 3 TIMES A DAY AS NEEDED ANXIETY  90 tablet  0  . aspirin 81 MG chewable tablet Chew 81 mg by mouth daily. Resume when it is okay by [name]      . atorvastatin (LIPITOR) 80 MG tablet Take 40 mg by mouth daily.      . budesonide-formoterol (SYMBICORT) 160-4.5 MCG/ACT inhaler Inhale 2 puffs into the lungs 2 (two) times daily.      . citalopram (CELEXA) 20 MG tablet Take 0.5 tablets (10 mg total) by mouth daily.      Marland Kitchen Dextromethorphan-Guaifenesin (MUCINEX DM) 30-600 MG TB12 Take 30-600 mg by mouth 2 (two) times daily.  28 each  0  . folic acid (FOLVITE) 1 MG tablet Take 1 mg by mouth daily.      . furosemide (LASIX) 20 MG tablet Take 1 tablet (20 mg total) by mouth daily.  30 tablet  0  . HYDROcodone-acetaminophen (VICODIN) 5-500 MG per tablet TAKE 1 TABLET BY MOUTH EVERY 4 HOURS AS NEEDED  90 tablet  0  . iron polysaccharides (NIFEREX) 150 MG capsule Take 150 mg by mouth 2 (two) times daily.      . metFORMIN (GLUCOPHAGE) 500 MG tablet Take 1 tablet (500 mg total) by mouth 2 (two) times daily with a meal.  180 tablet  1  .  metoprolol tartrate (LOPRESSOR) 25 MG tablet Take 25 mg by mouth 2 (two) times daily.      . pantoprazole (PROTONIX) 40 MG tablet Take 40 mg by mouth 2 (two) times daily.      . potassium chloride (K-DUR) 10 MEQ tablet Take 1 tablet (10 mEq total) by mouth daily.  30 tablet  0  . PROVENTIL HFA 108 (90 BASE) MCG/ACT inhaler Inhale 2 puffs into the lungs every 4 (four) hours as needed for shortness of breath. Shortness of breath      . zolpidem (AMBIEN) 10 MG tablet TAKE 1 TABLET BY MOUTH AT BEDTIME  30 tablet  0  . HYDROcodone-homatropine (HYCODAN) 5-1.5 MG/5ML syrup Take 5 mLs by mouth every 8 (eight) hours as needed for cough.  120 mL  0  . levofloxacin (LEVAQUIN) 500 MG tablet Take 1 tablet (500 mg total) by mouth daily.  7 tablet  0   No facility-administered encounter medications on file as of 01/31/2013.   Assessment and Plan:  1.  URI/COPD  exacerbation No wheezes on exam today but does have crackles and rhonchi. Will continue home inhalers, add Levaquin and hycodan. I prefer not to place on steroids due to h/o GIB.  Since he is not wheezing, I do think this is reasonable. Call or return to clinic prn if these symptoms worsen or fail to improve as anticipated. The patient indicates understanding of these issues and agrees with the plan.

## 2013-02-04 ENCOUNTER — Encounter: Payer: Self-pay | Admitting: *Deleted

## 2013-02-06 ENCOUNTER — Other Ambulatory Visit (HOSPITAL_COMMUNITY): Payer: Self-pay | Admitting: Cardiovascular Disease

## 2013-02-06 DIAGNOSIS — I739 Peripheral vascular disease, unspecified: Secondary | ICD-10-CM

## 2013-02-06 DIAGNOSIS — R0989 Other specified symptoms and signs involving the circulatory and respiratory systems: Secondary | ICD-10-CM

## 2013-02-07 ENCOUNTER — Telehealth: Payer: Self-pay | Admitting: *Deleted

## 2013-02-07 ENCOUNTER — Telehealth: Payer: Self-pay | Admitting: Family Medicine

## 2013-02-07 MED ORDER — ZOLPIDEM TARTRATE ER 12.5 MG PO TBCR: 12.5000 mg | EXTENDED_RELEASE_TABLET | Freq: Every evening | ORAL | Status: DC | PRN

## 2013-02-07 NOTE — Telephone Encounter (Signed)
Caller: Wojciech/Patient; Phone: [phone]; Reason for Call: Pt reports the Ambien 10 mg is not helping him sleep and would like to know if he can switch to another medication or increase the Ambien?  PLEASE F/U WITH PT TO ADVISE, THANK YOU.

## 2013-02-07 NOTE — Telephone Encounter (Signed)
We could try higher dose of ambien.  Please send in rx as entered below. Please advise caution as this is a high dose, controlled release.  If we switched to something else, we would like need high dose too.

## 2013-02-07 NOTE — Telephone Encounter (Signed)
Received a fax from pharmacy that Ambien Er is not on patients formulary. Requesting you prescribe another med. [facility]

## 2013-02-07 NOTE — Telephone Encounter (Signed)
Please find out what other sleep aid is on his formulary.

## 2013-02-07 NOTE — Telephone Encounter (Signed)
Left message on patient's voice mail that prior auth will need to be done on [place] script.  Advised him to try taking his current dose to see if that helps tonight. Will work on prior [employer].

## 2013-02-07 NOTE — Telephone Encounter (Signed)
Advised patient, [facility]

## 2013-02-08 ENCOUNTER — Telehealth: Payer: Self-pay | Admitting: *Deleted

## 2013-02-08 ENCOUNTER — Other Ambulatory Visit: Payer: Self-pay | Admitting: *Deleted

## 2013-02-08 NOTE — Telephone Encounter (Signed)
Prior Berkley Harvey is needed for [employer].  Form is on your desk. I couldn't find documentation of his need for ambien in chart notes, and they are asking for documentation.

## 2013-02-08 NOTE — Telephone Encounter (Signed)
Form completed and on my desk. 

## 2013-02-08 NOTE — Telephone Encounter (Signed)
Form faxed

## 2013-02-12 ENCOUNTER — Other Ambulatory Visit: Payer: Self-pay | Admitting: Pulmonary Disease

## 2013-02-12 MED ORDER — ALBUTEROL SULFATE HFA 108 (90 BASE) MCG/ACT IN AERS: 2.0000 | INHALATION_SPRAY | RESPIRATORY_TRACT | Status: DC | PRN

## 2013-02-13 ENCOUNTER — Telehealth: Payer: Self-pay | Admitting: *Deleted

## 2013-02-13 NOTE — Telephone Encounter (Signed)
Pt's insurance has denied prior [place] for [employer].  They will cover generic sonata, triazolam, temazepam, zolpidem IR, lunesta.  He has to have tried and failed 2 of these meds in order to get the approval.  Letter is on your desk.

## 2013-02-14 ENCOUNTER — Encounter (HOSPITAL_COMMUNITY): Payer: Medicare Other

## 2013-02-14 ENCOUNTER — Other Ambulatory Visit: Payer: Self-pay | Admitting: Family Medicine

## 2013-02-14 MED ORDER — AZITHROMYCIN 250 MG PO TABS: ORAL_TABLET | ORAL | Status: DC

## 2013-02-14 MED ORDER — TEMAZEPAM 15 MG PO CAPS: 15.0000 mg | ORAL_CAPSULE | Freq: Every evening | ORAL | Status: DC | PRN

## 2013-02-14 NOTE — Telephone Encounter (Signed)
Let's try restoril (temazepam).  Please phone in rx as entered below.  Will d/c ambien from med list.

## 2013-02-14 NOTE — Telephone Encounter (Signed)
Noted  

## 2013-02-14 NOTE — Telephone Encounter (Signed)
Rx for zpack sent to his pharmacy.  Please keep me posted with symptoms and needs to be evaluated if symptoms progress.

## 2013-02-14 NOTE — Telephone Encounter (Signed)
Advised patient

## 2013-02-14 NOTE — Telephone Encounter (Signed)
Pt seen 01/31/13; finished Levaquin and felt better; now productive cough with white phlegm,head and chest congestion,wheezing, SOB, CP on both sides of lungs when coughs. No fever. Pt said he cannot come in for appt today(does not feel like leaving his house). Pt said Levaquin caused severe nausea and diarrhea. Pt request different antibiotic to [facility].Please advise.

## 2013-02-14 NOTE — Telephone Encounter (Signed)
Advised patient, but he said the Remus Loffler is working better now.  He thinks it didn't work for [employer] because of the antibiotic he was taking.  He is going to continue taking the [place].

## 2013-02-15 ENCOUNTER — Other Ambulatory Visit: Payer: Self-pay | Admitting: Family Medicine

## 2013-02-15 NOTE — Telephone Encounter (Signed)
[facility] 

## 2013-02-19 ENCOUNTER — Telehealth: Payer: Self-pay

## 2013-02-19 NOTE — Telephone Encounter (Signed)
pts wife called and requested a prior auth for hydrocodone apap 5-300 mg; spoke with pharmacist at [facility]; pt paid out of pocket for med on 02/18/13; I asked pharmacist for request for PA for med. Pharmacist said since pt has already pd out of pocket a PA would not get pts money reimbursed. Pharmacist suggest next prescription be for Norco 5-325 mg. Mrs Gaida advised. Mrs Ketchum wants ins co called at [phone] for PA for the hydrocodone apap 5-300 mg that she paid for on 02/18/13; Mrs Maertens does not care what the pharmacist said. Mrs Dygert said for next prescription if Dr Dayton Martes does not think the increase in acetaminophen to 325 mg will hurt pts stomach condition she is OK with changing to Hydrocodone apap 5-325 mg.Please advise. Mrs [place] request call back.

## 2013-02-19 NOTE — Telephone Encounter (Signed)
Yes ok to change to Norco 5-325.

## 2013-02-20 ENCOUNTER — Other Ambulatory Visit: Payer: Self-pay | Admitting: Family Medicine

## 2013-02-21 NOTE — Telephone Encounter (Signed)
Spoke with patient and explained situation.  Changed medicine on med list to read 5/325, so that correct medicine that insurance will cover will be prescribed in the future.  Advised patient that the 5/300 dose is not covered by his insurance, but they will cover the 5/325 and that is ok for him to take and what will be prescribed in the future.  Advised that he cannot be reimbursed the what has been pain because there is no way to get the 5/300 authorized.  Pt stated understanding, expressed his appreciation for the care he receives from Dr. Dayton Martes.

## 2013-02-26 ENCOUNTER — Other Ambulatory Visit (HOSPITAL_COMMUNITY): Payer: Self-pay | Admitting: Cardiovascular Disease

## 2013-02-26 ENCOUNTER — Other Ambulatory Visit: Payer: Self-pay | Admitting: *Deleted

## 2013-02-26 DIAGNOSIS — R0989 Other specified symptoms and signs involving the circulatory and respiratory systems: Secondary | ICD-10-CM

## 2013-02-26 MED ORDER — CITALOPRAM HYDROBROMIDE 20 MG PO TABS: 10.0000 mg | ORAL_TABLET | Freq: Every day | ORAL | Status: DC

## 2013-02-28 ENCOUNTER — Other Ambulatory Visit: Payer: Self-pay | Admitting: Family Medicine

## 2013-03-01 ENCOUNTER — Ambulatory Visit (HOSPITAL_COMMUNITY)
Admission: RE | Admit: 2013-03-01 | Discharge: 2013-03-01 | Disposition: A | Discharge status: Home / Self Care | Payer: Medicare Other | Source: Ambulatory Visit | Attending: Cardiovascular Disease | Admitting: Cardiovascular Disease

## 2013-03-01 DIAGNOSIS — I739 Peripheral vascular disease, unspecified: Secondary | ICD-10-CM

## 2013-03-01 DIAGNOSIS — R0989 Other specified symptoms and signs involving the circulatory and respiratory systems: Secondary | ICD-10-CM

## 2013-03-01 NOTE — Telephone Encounter (Signed)
[facility] 

## 2013-03-01 NOTE — Progress Notes (Signed)
Lower extremity arterial limited complete.  gmg

## 2013-03-28 ENCOUNTER — Other Ambulatory Visit: Payer: Self-pay | Admitting: Family Medicine

## 2013-03-28 NOTE — Telephone Encounter (Signed)
Ok to refill 90, 0 ref 

## 2013-03-28 NOTE — Telephone Encounter (Signed)
RX CALLED TO PHARMACY  

## 2013-03-30 ENCOUNTER — Other Ambulatory Visit: Payer: Self-pay | Admitting: Family Medicine

## 2013-04-01 ENCOUNTER — Telehealth: Payer: Self-pay

## 2013-04-01 NOTE — Telephone Encounter (Signed)
[facility]

## 2013-04-01 NOTE — Telephone Encounter (Signed)
[facility] 

## 2013-04-01 NOTE — Telephone Encounter (Signed)
See below

## 2013-04-01 NOTE — Telephone Encounter (Signed)
Triage Record Num: [id number] Operator: Donnella Sham Patient Name: [name] Call Date & Time: 03/30/2013 1:47:13PM Patient Phone: [phone] PCP: [name] Patient Gender: Male PCP Fax : [phone] Patient DOB: May 22, 1941 Practice Name: [facility] Reason for Call: Caller: [place]; PCP: [name] (Family Practice); CB#: [phone]; Call regarding Medication Issue; Medication(s): Zolpidem; requesting a refill; last refill 02/28/13; takes one tab qHS; per profile, authorized 2tabs; advised to call back 04/01/13 for a full refill Protocol(s) Used: Office Note Recommended Outcome per Protocol: Information Noted and Sent to Office Reason for Outcome: Caller information to office Care Advice: ~

## 2013-04-22 ENCOUNTER — Other Ambulatory Visit: Payer: Self-pay | Admitting: Family Medicine

## 2013-04-22 ENCOUNTER — Other Ambulatory Visit: Payer: Self-pay

## 2013-04-22 MED ORDER — HYDROCODONE-ACETAMINOPHEN 5-325 MG PO TABS: 1.0000 | ORAL_TABLET | Freq: Four times a day (QID) | ORAL | Status: DC | PRN

## 2013-04-22 MED ORDER — ZOLPIDEM TARTRATE 10 MG PO TABS: ORAL_TABLET | ORAL | Status: DC

## 2013-04-22 NOTE — Addendum Note (Signed)
Addended by: Eliezer Bottom on: 04/22/2013 05:01 PM   Modules accepted: Orders

## 2013-04-22 NOTE — Telephone Encounter (Signed)
Pt states wrong dose of vicodin was called to pharmacy, 300 mg's was called in, should have been 325 mg's.  I called the pharmacy to change this, advised patient to throw out old prescription bottles so that this hopefully wont happen again.

## 2013-04-22 NOTE — Telephone Encounter (Signed)
[facility] 

## 2013-04-22 NOTE — Telephone Encounter (Signed)
pts husband request refill on hydrocodone apap and zolpidem to [facility]. Pt request refills on zolpidem because if pt needs refill on weekend pt has hard time getting med to last until office opens.Please advise.

## 2013-04-26 ENCOUNTER — Encounter: Payer: Self-pay | Admitting: *Deleted

## 2013-04-29 ENCOUNTER — Telehealth: Payer: Self-pay | Admitting: *Deleted

## 2013-04-29 ENCOUNTER — Ambulatory Visit: Payer: Medicare Other | Admitting: Family Medicine

## 2013-04-29 NOTE — Telephone Encounter (Signed)
Message copied by Eliezer Bottom on Mon Apr 29, 2013  8:33 AM ------      Message from: Dianne Dun      Created: Mon Apr 29, 2013  8:00 AM       Jacki Cones,      Pt on my schedule for "black stools."  Please call him to see if he has called his GI doctor.  That is who he needs to see.      Thanks,      Jovita Gamma ------

## 2013-04-29 NOTE — Telephone Encounter (Signed)
Advised patient.  He wanted to keep his appt here but I again told him that he needs to see GI or else go on to ER.  States he is pale and very tired, along with black stools, which he thinks is caused by his taking iron.  He says he does feel really bad, will call GI or will go to the ER "eventually".

## 2013-04-30 ENCOUNTER — Telehealth: Payer: Self-pay | Admitting: Family Medicine

## 2013-04-30 NOTE — Telephone Encounter (Signed)
Please call to check on pt.  I do not see that he went to [facility].  Did he go to [facility]?

## 2013-04-30 NOTE — Telephone Encounter (Signed)
Patient did not go to ER.  He says he never said he would go to ER, only that he would go to see [name] (?). Patient was rather defensive and so I did not pursue the conversation further.

## 2013-05-14 ENCOUNTER — Observation Stay (HOSPITAL_COMMUNITY)
Admission: EM | Admit: 2013-05-14 | Discharge: 2013-05-17 | DRG: 191 | Disposition: A | Discharge status: Home Health | Payer: Medicare Other | Attending: Internal Medicine | Admitting: Internal Medicine

## 2013-05-14 ENCOUNTER — Emergency Department (HOSPITAL_COMMUNITY): Payer: Medicare Other

## 2013-05-14 ENCOUNTER — Encounter (HOSPITAL_COMMUNITY): Payer: Self-pay | Admitting: *Deleted

## 2013-05-14 DIAGNOSIS — D509 Iron deficiency anemia, unspecified: Secondary | ICD-10-CM

## 2013-05-14 DIAGNOSIS — I5032 Chronic diastolic (congestive) heart failure: Secondary | ICD-10-CM | POA: Diagnosis present

## 2013-05-14 DIAGNOSIS — K922 Gastrointestinal hemorrhage, unspecified: Secondary | ICD-10-CM

## 2013-05-14 DIAGNOSIS — I219 Acute myocardial infarction, unspecified: Secondary | ICD-10-CM

## 2013-05-14 DIAGNOSIS — E785 Hyperlipidemia, unspecified: Secondary | ICD-10-CM

## 2013-05-14 DIAGNOSIS — F329 Major depressive disorder, single episode, unspecified: Secondary | ICD-10-CM

## 2013-05-14 DIAGNOSIS — I714 Abdominal aortic aneurysm, without rupture, unspecified: Secondary | ICD-10-CM

## 2013-05-14 DIAGNOSIS — K921 Melena: Secondary | ICD-10-CM

## 2013-05-14 DIAGNOSIS — J438 Other emphysema: Secondary | ICD-10-CM

## 2013-05-14 DIAGNOSIS — R5381 Other malaise: Principal | ICD-10-CM | POA: Insufficient documentation

## 2013-05-14 DIAGNOSIS — R42 Dizziness and giddiness: Secondary | ICD-10-CM | POA: Insufficient documentation

## 2013-05-14 DIAGNOSIS — J449 Chronic obstructive pulmonary disease, unspecified: Secondary | ICD-10-CM

## 2013-05-14 DIAGNOSIS — D72829 Elevated white blood cell count, unspecified: Secondary | ICD-10-CM

## 2013-05-14 DIAGNOSIS — J9620 Acute and chronic respiratory failure, unspecified whether with hypoxia or hypercapnia: Secondary | ICD-10-CM

## 2013-05-14 DIAGNOSIS — R5383 Other fatigue: Secondary | ICD-10-CM

## 2013-05-14 DIAGNOSIS — J4489 Other specified chronic obstructive pulmonary disease: Secondary | ICD-10-CM | POA: Insufficient documentation

## 2013-05-14 DIAGNOSIS — E119 Type 2 diabetes mellitus without complications: Secondary | ICD-10-CM

## 2013-05-14 DIAGNOSIS — R0602 Shortness of breath: Secondary | ICD-10-CM | POA: Insufficient documentation

## 2013-05-14 DIAGNOSIS — F32A Depression, unspecified: Secondary | ICD-10-CM

## 2013-05-14 DIAGNOSIS — D649 Anemia, unspecified: Secondary | ICD-10-CM

## 2013-05-14 DIAGNOSIS — I252 Old myocardial infarction: Secondary | ICD-10-CM | POA: Insufficient documentation

## 2013-05-14 DIAGNOSIS — Z951 Presence of aortocoronary bypass graft: Secondary | ICD-10-CM | POA: Insufficient documentation

## 2013-05-14 DIAGNOSIS — I2510 Atherosclerotic heart disease of native coronary artery without angina pectoris: Secondary | ICD-10-CM

## 2013-05-14 DIAGNOSIS — Z794 Long term (current) use of insulin: Secondary | ICD-10-CM | POA: Insufficient documentation

## 2013-05-14 DIAGNOSIS — R0600 Dyspnea, unspecified: Secondary | ICD-10-CM

## 2013-05-14 DIAGNOSIS — J309 Allergic rhinitis, unspecified: Secondary | ICD-10-CM

## 2013-05-14 DIAGNOSIS — I499 Cardiac arrhythmia, unspecified: Secondary | ICD-10-CM

## 2013-05-14 DIAGNOSIS — I10 Essential (primary) hypertension: Secondary | ICD-10-CM

## 2013-05-14 DIAGNOSIS — D62 Acute posthemorrhagic anemia: Secondary | ICD-10-CM | POA: Insufficient documentation

## 2013-05-14 LAB — DIFFERENTIAL
Basophils Absolute: 0 10*3/uL (ref 0.0–0.1)
Basophils Relative: 0 % (ref 0–1)
Lymphocytes Relative: 10 % — ABNORMAL LOW (ref 12–46)
Neutro Abs: 7.1 10*3/uL (ref 1.7–7.7)
Neutrophils Relative %: 80 % — ABNORMAL HIGH (ref 43–77)

## 2013-05-14 LAB — PRO B NATRIURETIC PEPTIDE: Pro B Natriuretic peptide (BNP): 327.8 pg/mL — ABNORMAL HIGH (ref 0–125)

## 2013-05-14 LAB — OCCULT BLOOD, POC DEVICE: Fecal Occult Bld: NEGATIVE

## 2013-05-14 LAB — TYPE AND SCREEN
ABO/RH(D): B NEG
Antibody Screen: NEGATIVE

## 2013-05-14 LAB — CBC
MCHC: 28.4 g/dL — ABNORMAL LOW (ref 30.0–36.0)
Platelets: 255 10*3/uL (ref 150–400)
RDW: 17.3 % — ABNORMAL HIGH (ref 11.5–15.5)
WBC: 8.9 10*3/uL (ref 4.0–10.5)

## 2013-05-14 LAB — BASIC METABOLIC PANEL
CO2: 24 mEq/L (ref 19–32)
Calcium: 8.7 mg/dL (ref 8.4–10.5)
Chloride: 98 mEq/L (ref 96–112)
Creatinine, Ser: 0.81 mg/dL (ref 0.50–1.35)
Glucose, Bld: 189 mg/dL — ABNORMAL HIGH (ref 70–99)
Sodium: 136 mEq/L (ref 135–145)

## 2013-05-14 NOTE — ED Notes (Addendum)
Pt says he has had black stools for years and is still experiencing the same; pt says he has had no energy to get out of bed
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
## 2013-05-14 NOTE — ED Notes (Signed)
Pt states having increased shortness of breath; feels like going to pass out; history of anemia

## 2013-05-15 ENCOUNTER — Encounter (HOSPITAL_COMMUNITY): Payer: Self-pay | Admitting: *Deleted

## 2013-05-15 ENCOUNTER — Inpatient Hospital Stay (HOSPITAL_COMMUNITY): Payer: Medicare Other

## 2013-05-15 DIAGNOSIS — I714 Abdominal aortic aneurysm, without rupture: Secondary | ICD-10-CM

## 2013-05-15 DIAGNOSIS — D649 Anemia, unspecified: Secondary | ICD-10-CM

## 2013-05-15 LAB — BASIC METABOLIC PANEL
Chloride: 100 mEq/L (ref 96–112)
Creatinine, Ser: 0.89 mg/dL (ref 0.50–1.35)
GFR calc Af Amer: >90 mL/min (ref >90)
GFR calc non Af Amer: 84 mL/min — ABNORMAL LOW (ref >90)

## 2013-05-15 LAB — GLUCOSE, CAPILLARY
Glucose-Capillary: 211 mg/dL — ABNORMAL HIGH (ref 70–99)
Glucose-Capillary: 182 mg/dL — ABNORMAL HIGH (ref 70–99)
Glucose-Capillary: 190 mg/dL — ABNORMAL HIGH (ref 70–99)
Glucose-Capillary: 189 mg/dL — ABNORMAL HIGH (ref 70–99)

## 2013-05-15 LAB — CBC
HCT: 30.3 % — ABNORMAL LOW (ref 39.0–52.0)
Platelets: 268 10*3/uL (ref 150–400)
RDW: 17.4 % — ABNORMAL HIGH (ref 11.5–15.5)
WBC: 11 10*3/uL — ABNORMAL HIGH (ref 4.0–10.5)

## 2013-05-15 LAB — TROPONIN I: Troponin I: <0.3 ng/mL (ref <0.30)

## 2013-05-15 LAB — HEMOGLOBIN A1C
Hgb A1c MFr Bld: 7 % — ABNORMAL HIGH (ref <5.7)
Mean Plasma Glucose: 154 mg/dL — ABNORMAL HIGH (ref <117)

## 2013-05-15 LAB — TSH: TSH: 2.869 u[IU]/mL (ref 0.350–4.500)

## 2013-05-15 MED ORDER — BUDESONIDE-FORMOTEROL FUMARATE 160-4.5 MCG/ACT IN AERO
2.0000 | INHALATION_SPRAY | Freq: Two times a day (BID) | RESPIRATORY_TRACT | Status: DC
  Administered 2013-05-15 – 2013-05-17 (×5): 2 via RESPIRATORY_TRACT
  Filled 2013-05-15: qty 6

## 2013-05-15 MED ORDER — ALPRAZOLAM 0.25 MG PO TABS
0.2500 mg | ORAL_TABLET | Freq: Three times a day (TID) | ORAL | Status: DC | PRN
  Administered 2013-05-15 – 2013-05-16 (×5): 0.25 mg via ORAL
  Filled 2013-05-15 (×5): qty 1

## 2013-05-15 MED ORDER — SODIUM CHLORIDE 0.9 % IJ SOLN
3.0000 mL | Freq: Two times a day (BID) | INTRAMUSCULAR | Status: DC
  Administered 2013-05-15 – 2013-05-17 (×3): 3 mL via INTRAVENOUS

## 2013-05-15 MED ORDER — ALBUTEROL SULFATE (5 MG/ML) 0.5% IN NEBU
2.5000 mg | INHALATION_SOLUTION | RESPIRATORY_TRACT | Status: DC
  Administered 2013-05-15: 2.5 mg via RESPIRATORY_TRACT
  Filled 2013-05-15: qty 0.5

## 2013-05-15 MED ORDER — HYDROCODONE-ACETAMINOPHEN 5-325 MG PO TABS
1.0000 | ORAL_TABLET | Freq: Four times a day (QID) | ORAL | Status: DC | PRN
  Administered 2013-05-15 – 2013-05-17 (×3): 1 via ORAL
  Filled 2013-05-15 (×3): qty 1

## 2013-05-15 MED ORDER — ALBUTEROL SULFATE (5 MG/ML) 0.5% IN NEBU
2.5000 mg | INHALATION_SOLUTION | Freq: Three times a day (TID) | RESPIRATORY_TRACT | Status: DC
  Administered 2013-05-16 – 2013-05-17 (×4): 2.5 mg via RESPIRATORY_TRACT
  Filled 2013-05-15 (×6): qty 0.5

## 2013-05-15 MED ORDER — METOPROLOL TARTRATE 25 MG PO TABS
25.0000 mg | ORAL_TABLET | Freq: Two times a day (BID) | ORAL | Status: DC
  Administered 2013-05-15 – 2013-05-17 (×5): 25 mg via ORAL
  Filled 2013-05-15 (×6): qty 1

## 2013-05-15 MED ORDER — ALBUTEROL SULFATE (5 MG/ML) 0.5% IN NEBU
2.5000 mg | INHALATION_SOLUTION | RESPIRATORY_TRACT | Status: DC | PRN
  Administered 2013-05-17: 2.5 mg via RESPIRATORY_TRACT
  Filled 2013-05-15: qty 0.5

## 2013-05-15 MED ORDER — INSULIN ASPART 100 UNIT/ML ~~LOC~~ SOLN
0.0000 [IU] | Freq: Three times a day (TID) | SUBCUTANEOUS | Status: DC
  Administered 2013-05-15 (×2): 2 [IU] via SUBCUTANEOUS
  Administered 2013-05-16: 5 [IU] via SUBCUTANEOUS
  Administered 2013-05-16: 2 [IU] via SUBCUTANEOUS

## 2013-05-15 MED ORDER — DM-GUAIFENESIN ER 30-600 MG PO TB12
1.0000 | ORAL_TABLET | Freq: Two times a day (BID) | ORAL | Status: DC
  Administered 2013-05-15 – 2013-05-17 (×5): 1 via ORAL
  Filled 2013-05-15 (×6): qty 1

## 2013-05-15 MED ORDER — CITALOPRAM HYDROBROMIDE 10 MG PO TABS
10.0000 mg | ORAL_TABLET | Freq: Every day | ORAL | Status: DC
  Administered 2013-05-15 – 2013-05-17 (×3): 10 mg via ORAL
  Filled 2013-05-15 (×3): qty 1

## 2013-05-15 MED ORDER — FOLIC ACID 1 MG PO TABS
2.0000 mg | ORAL_TABLET | Freq: Every day | ORAL | Status: DC
  Administered 2013-05-15 – 2013-05-17 (×3): 2 mg via ORAL
  Filled 2013-05-15 (×3): qty 2

## 2013-05-15 MED ORDER — IOHEXOL 350 MG/ML SOLN
100.0000 mL | Freq: Once | INTRAVENOUS | Status: AC | PRN
  Administered 2013-05-15: 100 mL via INTRAVENOUS

## 2013-05-15 MED ORDER — INSULIN ASPART 100 UNIT/ML ~~LOC~~ SOLN
0.0000 [IU] | Freq: Every day | SUBCUTANEOUS | Status: DC
  Administered 2013-05-16: 2 [IU] via SUBCUTANEOUS

## 2013-05-15 MED ORDER — ATORVASTATIN CALCIUM 40 MG PO TABS
40.0000 mg | ORAL_TABLET | Freq: Every day | ORAL | Status: DC
  Administered 2013-05-15 – 2013-05-16 (×2): 40 mg via ORAL
  Filled 2013-05-15 (×3): qty 1

## 2013-05-15 MED ORDER — ASPIRIN 81 MG PO CHEW: 81.0000 mg | CHEWABLE_TABLET | Freq: Every day | ORAL | Status: DC

## 2013-05-15 MED ORDER — ZOLPIDEM TARTRATE 5 MG PO TABS
5.0000 mg | ORAL_TABLET | Freq: Every evening | ORAL | Status: DC | PRN
  Administered 2013-05-15: 5 mg via ORAL
  Filled 2013-05-15: qty 1

## 2013-05-15 MED ORDER — IPRATROPIUM BROMIDE 0.02 % IN SOLN
0.5000 mg | RESPIRATORY_TRACT | Status: DC
  Administered 2013-05-15: 0.5 mg via RESPIRATORY_TRACT
  Filled 2013-05-15: qty 2.5

## 2013-05-15 MED ORDER — IPRATROPIUM BROMIDE 0.02 % IN SOLN
0.5000 mg | Freq: Three times a day (TID) | RESPIRATORY_TRACT | Status: DC
  Administered 2013-05-16 – 2013-05-17 (×4): 0.5 mg via RESPIRATORY_TRACT
  Filled 2013-05-15 (×6): qty 2.5

## 2013-05-15 MED ORDER — BIOTENE DRY MOUTH MT LIQD
15.0000 mL | Freq: Two times a day (BID) | OROMUCOSAL | Status: DC
  Administered 2013-05-15 – 2013-05-17 (×4): 15 mL via OROMUCOSAL

## 2013-05-15 MED ORDER — POLYSACCHARIDE IRON COMPLEX 150 MG PO CAPS
150.0000 mg | ORAL_CAPSULE | Freq: Two times a day (BID) | ORAL | Status: DC
  Administered 2013-05-15 – 2013-05-17 (×5): 150 mg via ORAL
  Filled 2013-05-15 (×6): qty 1

## 2013-05-15 MED ORDER — METFORMIN HCL 500 MG PO TABS
500.0000 mg | ORAL_TABLET | Freq: Two times a day (BID) | ORAL | Status: DC
  Administered 2013-05-15 (×2): 500 mg via ORAL
  Filled 2013-05-15 (×5): qty 1

## 2013-05-15 MED ORDER — ONDANSETRON HCL 4 MG PO TABS: 4.0000 mg | ORAL_TABLET | Freq: Four times a day (QID) | ORAL | Status: DC | PRN

## 2013-05-15 MED ORDER — ONDANSETRON HCL 4 MG/2ML IJ SOLN: 4.0000 mg | Freq: Four times a day (QID) | INTRAMUSCULAR | Status: DC | PRN

## 2013-05-15 MED ORDER — PANTOPRAZOLE SODIUM 40 MG PO TBEC
40.0000 mg | DELAYED_RELEASE_TABLET | Freq: Two times a day (BID) | ORAL | Status: DC
  Administered 2013-05-15 – 2013-05-17 (×5): 40 mg via ORAL
  Filled 2013-05-15 (×6): qty 1

## 2013-05-15 MED ORDER — ZOLPIDEM TARTRATE 10 MG PO TABS
10.0000 mg | ORAL_TABLET | Freq: Every evening | ORAL | Status: DC | PRN
  Administered 2013-05-15: 10 mg via ORAL
  Filled 2013-05-15: qty 2

## 2013-05-15 MED ORDER — ALBUTEROL SULFATE HFA 108 (90 BASE) MCG/ACT IN AERS
2.0000 | INHALATION_SPRAY | RESPIRATORY_TRACT | Status: DC | PRN
  Filled 2013-05-15: qty 6.7

## 2013-05-15 MED ORDER — ZOLPIDEM TARTRATE 5 MG PO TABS: 5.0000 mg | ORAL_TABLET | Freq: Every evening | ORAL | Status: DC | PRN

## 2013-05-15 NOTE — ED Notes (Addendum)
Pt requesting to have something to eat. Patient ok to eat per Ivonne Andrew PA. Pt made aware that this RN is going to get him a sandwich. Pt then began to ask why he was only allowed one sandwich. Pt made aware that in the ER, we can only give one sandwich to each patient but that I would also bring him some other snacks. Pt then became very aggressive and sarcastic stating that he is "sorry that a 200 some pound man who is 6 foot tall needs two sandwiches and that he is sorry for being so greedy". This RN asked patient to please communicate nicely with her as she has done the same with him. After returning to the room with the sandwich and various snacks, pt stared straight ahead and refused to speak with this RN. Whitlow NT and CN Adkins called into room and patient continued to insult this RN, the rules and policies that only allow him one sandwich, and the hospital system as a whole, asking "do you know what this hospital is ranked?" Pt is upset that we did not inform him up front how many sandwiches he could have and mocks this RN in another voice when she informs him that it is simply the policy.

## 2013-05-15 NOTE — Progress Notes (Addendum)
[facility] PROGRESS NOTE  [name] ZOX:[id number] DOB: [birth year] DOA: 05/14/2013 PCP: [name], MD  Assessment/Plan  Anemia, acute blood loss of unclear source  endoscopy done 11/2012 with gastric and duodenal polyps and per GI thought to be source of bleeding at that time, recommendation was to proceed with transfusion as necessary and have outpatient follow up.  Patient missed his follow up appointment due to this admission.  Continues to have black watery stools suggestive of upper GI bleed however his BUN is not elevated. - Dr. Nicholes Mango to see in AM - hgb approximately stable - CBC in AM  -  Transfuse for hgb < 7 or hemodynamic instability with ongoing bleeding -  Patient amenable to PIV now.  HYPERTENSION stable to mildly elevated, but risk of hypoTN with ongoing bleeding - continue home medical regimen Metoprolol   DM blood sugars mildly elevated - Continue metformin and add low-dose sliding scale insulin  COPD (chronic obstructive pulmonary disease) very diminished breath sounds - Scheduled duo nebs  - Continue Symbicort - Consider systemic steroids and antibiotics her respiratory status worsens, but we will try to avoid for now given his ongoing GI bleed  Diastolic HF (heart failure)  - last 2 D ECHO in 11/2012 with grade I diastolic dysfunction  - will monitor I's and O's, daily weights   HYPERLIPIDEMIA  - continue statin   Lightheadedness and frequent falls, may be related to not wearing his oxygen with ambulation in setting of severe COPD -  Ambulate with and without oxygen to determine how much oxygen he needs to maintain oxygen saturation of greater than 88% -  D-dimer (if >=710, will need CTa to rule out PE)  Deconditioning - PT evaluation   Diet:   diabetic  Access:  currently none as patient had been refusing, but now he is agreeable to peripheral IV  IVF:   none  Proph:   SCDs  Code Status: Full code Family Communication: Spoke with the  patient alone  Disposition Plan:  pending resolution of dark stools and hemoglobin stable   Consultants:  Gastroenterology  Procedures:   chest x-ray  Antibiotics:   none    HPI/Subjective:  The patient states that he continues to have shortness of breath. He had 2 watery bowel movements that were very black today. He gets lightheaded when he stands up or walks around without his oxygen.   Objective: Filed Vitals:   05/15/13 0133 05/15/13 0520 05/15/13 0845 05/15/13 1430  BP: 152/72 142/78  153/79  Pulse: 86 99  96  Temp: 97.9 F (36.6 C) 98 F (36.7 C)  97.9 F (36.6 C)  TempSrc: Oral Oral  Oral  Resp:      Height: 6\' 2"  (1.88 m)     Weight: 119.4 kg (263 lb 3.7 oz)     SpO2: 96% 98% 94% 99%    Intake/Output Summary (Last 24 hours) at 05/15/13 1709 Last data filed at 05/15/13 1556  Gross per 24 hour  Intake    780 ml  Output   1175 ml  Net   -395 ml   Filed Weights   05/15/13 0133  Weight: 119.4 kg (263 lb 3.7 oz)    Exam:   General:   obese Caucasian male,  No acute distress  HEENT:  NCAT,   MMM  Cardiovascular:   RRR, nl S1, S2 no mrg, 2+ pulses, warm extremities  Respiratory:   very diminished breath sounds bilaterally without focal rales or rhonchi, no  increased WOB  Abdomen:    NABS, soft,  mild tenderness to palpation along the left lateral lower quadrant without rebound or guarding, nondistended   MSK:    Normal tone and bulk, no LEE  Neuro:   Grossly intact  Data Reviewed: Basic Metabolic Panel:  Recent Labs Lab 05/14/13 2055 05/15/13 0450  NA 136 138  K 4.3 3.7  CL 98 100  CO2 24 27  GLUCOSE 189* 201*  BUN 10 12  CREATININE 0.81 0.89  CALCIUM 8.7 8.8   Liver Function Tests: No results found for this basename: AST, ALT, ALKPHOS, BILITOT, PROT, ALBUMIN,  in the last 168 hours No results found for this basename: LIPASE, AMYLASE,  in the last 168 hours No results found for this basename: AMMONIA,  in the last 168  hours CBC:  Recent Labs Lab 05/14/13 2130 05/15/13 0450  WBC 8.9 11.0*  NEUTROABS 7.1  --   HGB 8.1* 8.2*  HCT 28.5* 30.3*  MCV 80.3 81.0  PLT 255 268   Cardiac Enzymes: No results found for this basename: CKTOTAL, CKMB, CKMBINDEX, TROPONINI,  in the last 168 hours BNP (last 3 results)  Recent Labs  05/17/12 1030 12/04/12 1335 05/14/13 2055  PROBNP 375.0* 1065.0* 327.8*   CBG:  Recent Labs Lab 05/15/13 0736 05/15/13 1101  GLUCAP 211* 182*    No results found for this or any previous visit (from the past 240 hour(s)).   Studies: Dg Chest 2 View  05/14/2013   *RADIOLOGY REPORT*  Clinical Data: Shortness of breath  CHEST - 2 VIEW  Comparison: 12/04/2012  Findings: The left upper lobe bulla re-identified.  Evidence of CABG.  Heart size mildly enlarged with central vascular congestion but no evidence for edema.  Curvilinear right lower lobe scarring is stable.  Hiatal hernia noted.  No acute osseous finding.  IMPRESSION: Stable right lower lobe scarring.   Original Report Authenticated By: Christiana Pellant, M.D.    Scheduled Meds: . ipratropium  0.5 mg Nebulization Q4H   And  . albuterol  2.5 mg Nebulization Q4H  . antiseptic oral rinse  15 mL Mouth Rinse BID  . atorvastatin  40 mg Oral q1800  . budesonide-formoterol  2 puff Inhalation BID  . citalopram  10 mg Oral Daily  . dextromethorphan-guaiFENesin  1 tablet Oral BID  . folic acid  2 mg Oral Daily  . insulin aspart  0-5 Units Subcutaneous QHS  . insulin aspart  0-9 Units Subcutaneous TID WC  . iron polysaccharides  150 mg Oral BID  . metFORMIN  500 mg Oral BID WC  . metoprolol tartrate  25 mg Oral BID  . pantoprazole  40 mg Oral BID  . sodium chloride  3 mL Intravenous Q12H   Continuous Infusions:   Principal Problem:   Fatigue Active Problems:   HYPERLIPIDEMIA   HYPERTENSION   DM   COPD (chronic obstructive pulmonary disease)   Anemia   Diastolic HF (heart failure)    Time spent: 30  min    Cassey Hurrell  [facility] Pager [phone]. If 7PM-7AM, please contact night-coverage at [email or web address], password [facility] 05/15/2013, 5:09 PM  LOS: 1 day

## 2013-05-15 NOTE — ED Provider Notes (Signed)
Medical screening examination/treatment/procedure(s) were performed by non-physician practitioner and as supervising physician I was immediately available for consultation/collaboration.   Makayla Lanter, MD 05/15/13 1517 

## 2013-05-15 NOTE — Care Management Note (Addendum)
    Page 1 of 2   05/17/2013     12:50:35 PM   CARE MANAGEMENT NOTE 05/17/2013  Patient:  [name], [name]   Account Number:  [id number]  Date Initiated:  05/15/2013  Documentation initiated by:  Lanier Clam  Subjective/Objective Assessment:   ADMITTED W/FATIGUE,WEAKNESS.ZO:XWRU.     Action/Plan:   FROM HOME W/SPOUSE.HAS PCP,PHARMACY.HAS HOME 02 & TRAVEL,CANE,3N1,RW.   Anticipated DC Date:  05/17/2013   Anticipated DC Plan:  HOME W HOME HEALTH SERVICES      DC Planning Services  CM consult      Choice offered to / List presented to:  C-1 Patient        HH arranged  HH-1 RN  HH-2 PT      [facility] agency  [facility]   Status of service:  Completed, signed off Medicare Important Message given?   (If response is "NO", the following Medicare IM given date fields will be blank) Date Medicare IM given:   Date Additional Medicare IM given:    Discharge Disposition:  HOME W HOME HEALTH SERVICES  Per UR Regulation:  Reviewed for med. necessity/level of care/duration of stay  If discussed at Long Length of Stay Meetings, dates discussed:    Comments:  05/17/13 Gerda Yin RN,BSN NCM [phone] [facility] AWARE OF D/C HOME W/HHRN/PT ORDERED.NO FURTHER HHC NEEDS OR ORDERS.  05/16/13 Abdoulie Tierce RN,BSN NCM [phone] PT-HH.[facility] CHOSEN FOR HH.TC [facility] REP INFORMED OF HHRN/PT ORDERS.ALREADY HAS HOME 02 & TRAVEL IN [place].  05/15/13 Rin Gorton RN,BSN NCM [phone] RECEIVED REFERRAL FOR COPD GOLD.WOULD RECOMMEND PT/OT CONS.PATIENT STATES HIS SPOUSE MANAGES HIS MEDS, & HE F/U W/PCP.

## 2013-05-15 NOTE — ED Notes (Addendum)
Pt refused to eat the sandwich and food that he was offered at this time. Pt says that he "will not disrespect his body by only eating one sandwich". Charge nurse spent 30+ minutes in room with patient talking to him and finding out what his concerns were.

## 2013-05-15 NOTE — Progress Notes (Signed)
Pt refusing IV at this time. Informed about our policy regarding IV's and telemetry. Pt still refusing, will wait to see what [name] orders and if an IV is necessary. ED attempted IV start but was unable.  [name], Howard Edwards RN
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
## 2013-05-16 DIAGNOSIS — J449 Chronic obstructive pulmonary disease, unspecified: Secondary | ICD-10-CM

## 2013-05-16 DIAGNOSIS — K921 Melena: Secondary | ICD-10-CM

## 2013-05-16 LAB — CBC
Hemoglobin: 8.7 g/dL — ABNORMAL LOW (ref 13.0–17.0)
RBC: 3.84 MIL/uL — ABNORMAL LOW (ref 4.22–5.81)
WBC: 9 10*3/uL (ref 4.0–10.5)

## 2013-05-16 LAB — BASIC METABOLIC PANEL
Chloride: 98 mEq/L (ref 96–112)
GFR calc Af Amer: 81 mL/min — ABNORMAL LOW (ref >90)
GFR calc non Af Amer: 70 mL/min — ABNORMAL LOW (ref >90)
Potassium: 3.6 mEq/L (ref 3.5–5.1)
Sodium: 138 mEq/L (ref 135–145)

## 2013-05-16 LAB — GLUCOSE, CAPILLARY
Glucose-Capillary: 195 mg/dL — ABNORMAL HIGH (ref 70–99)
Glucose-Capillary: 289 mg/dL — ABNORMAL HIGH (ref 70–99)
Glucose-Capillary: 222 mg/dL — ABNORMAL HIGH (ref 70–99)

## 2013-05-16 MED ORDER — INSULIN ASPART 100 UNIT/ML ~~LOC~~ SOLN
0.0000 [IU] | Freq: Three times a day (TID) | SUBCUTANEOUS | Status: DC
  Administered 2013-05-16: 11 [IU] via SUBCUTANEOUS
  Administered 2013-05-17: 3 [IU] via SUBCUTANEOUS
  Administered 2013-05-17: 8 [IU] via SUBCUTANEOUS

## 2013-05-16 MED ORDER — METFORMIN HCL 500 MG PO TABS
500.0000 mg | ORAL_TABLET | Freq: Two times a day (BID) | ORAL | Status: DC
  Filled 2013-05-16: qty 1

## 2013-05-16 MED ORDER — ZOLPIDEM TARTRATE 10 MG PO TABS
10.0000 mg | ORAL_TABLET | Freq: Every evening | ORAL | Status: DC | PRN
  Administered 2013-05-16: 10 mg via ORAL
  Filled 2013-05-16: qty 1

## 2013-05-16 NOTE — Progress Notes (Signed)
[facility] PROGRESS NOTE  [name] UJW:[id number] DOB: [birth year] DOA: 05/14/2013 PCP: [name], MD  Assessment/Plan  Lightheadedness and frequent falls, may be related to not wearing his oxygen with ambulation in setting of severe COPD -  Ambulated and recommend 2L continuous oxygen, particularly with exertion -  D-dimer elevated, but CTa neg for PE -  Agree that patient would benefit from pulmonology follow up and pulmonary rehab.  Deconditioning - PT recommending HHPT  Anemia, acute blood loss of unclear source  endoscopy done 11/2012 with gastric and duodenal polyps and per GI thought to be source of bleeding at that time, recommendation was to proceed with transfusion as necessary and have outpatient follow up.  Patient missed his follow up appointment due to this admission.  Had another soft black stool this morning, however, hgb stable.  - Dr. Nicholes Mango to see  -  Transfuse for hgb < 7 or hemodynamic instability with ongoing bleeding  HYPERTENSION stable to mildly elevated, but risk of hypoTN with ongoing bleeding - continue home medical regimen Metoprolol   DM blood sugars mildly elevated.  A1c 7 - Continue metformin  -  Increase to mod dose SSI  COPD (chronic obstructive pulmonary disease) very diminished breath sounds - Scheduled duo nebs  - Continue Symbicort - Consider systemic steroids and antibiotics if respiratory status worsens, but we will try to avoid for now given his propensity for GI bleed  Diastolic HF (heart failure)  - last 2 D ECHO in 11/2012 with grade I diastolic dysfunction  - will monitor I's and O's, daily weights   HYPERLIPIDEMIA  - continue statin   Diet:   diabetic  Access:  PIV IVF:   none  Proph:   SCDs  Code Status: Full code Family Communication: Spoke with the patient alone  Disposition Plan:  pending GI eval.  Will touch base with pulmonary regarding close follow up and arranging pulm rehab.      Consultants:  Gastroenterology  Procedures:   chest x-ray  Antibiotics:   none    HPI/Subjective:  The patient states that he continues to have shortness of breath. He had 2 watery bowel movements that were very black today. He gets lightheaded when he stands up or walks around without his oxygen.   Objective: Filed Vitals:   05/16/13 0640 05/16/13 0821 05/16/13 1330 05/16/13 1448  BP: 149/79  125/78   Pulse: 87  101   Temp: 98.1 F (36.7 C)  98.1 F (36.7 C)   TempSrc: Oral  Oral   Resp: 20  20   Height:      Weight:      SpO2: 97% 96% 99% 98%    Intake/Output Summary (Last 24 hours) at 05/16/13 1544 Last data filed at 05/16/13 1331  Gross per 24 hour  Intake    680 ml  Output   1475 ml  Net   -795 ml   Filed Weights   05/15/13 0133  Weight: 119.4 kg (263 lb 3.7 oz)    Exam:   General:   obese Caucasian male,  No acute distress  HEENT:  NCAT,   MMM  Cardiovascular:   RRR, nl S1, S2 no mrg, 2+ pulses, warm extremities  Respiratory:   diminished breath sounds bilaterally but improved from yesterday, without focal rales or rhonchi, no increased WOB  Abdomen:    NABS, soft, nontender, nondistended   MSK:    Normal tone and bulk, no LEE  Neuro:   Grossly intact  Data Reviewed: Basic Metabolic Panel:  Recent Labs Lab 05/14/13 2055 05/15/13 0450 05/16/13 0440  NA 136 138 138  K 4.3 3.7 3.6  CL 98 100 98  CO2 24 27 30   GLUCOSE 189* 201* 205*  BUN 10 12 14   CREATININE 0.81 0.89 1.04  CALCIUM 8.7 8.8 8.9   Liver Function Tests: No results found for this basename: AST, ALT, ALKPHOS, BILITOT, PROT, ALBUMIN,  in the last 168 hours No results found for this basename: LIPASE, AMYLASE,  in the last 168 hours No results found for this basename: AMMONIA,  in the last 168 hours CBC:  Recent Labs Lab 05/14/13 2130 05/15/13 0450 05/16/13 0440  WBC 8.9 11.0* 9.0  NEUTROABS 7.1  --   --   HGB 8.1* 8.2* 8.7*  HCT 28.5* 30.3* 30.7*  MCV 80.3  81.0 79.9  PLT 255 268 294   Cardiac Enzymes:  Recent Labs Lab 05/15/13 1753  TROPONINI <0.30   BNP (last 3 results)  Recent Labs  05/17/12 1030 12/04/12 1335 05/14/13 2055  PROBNP 375.0* 1065.0* 327.8*   CBG:  Recent Labs Lab 05/15/13 1101 05/15/13 1656 05/15/13 2151 05/16/13 0738 05/16/13 1120  GLUCAP 182* 190* 189* 195* 289*    No results found for this or any previous visit (from the past 240 hour(s)).   Studies: Dg Chest 2 View  05/14/2013   *RADIOLOGY REPORT*  Clinical Data: Shortness of breath  CHEST - 2 VIEW  Comparison: 12/04/2012  Findings: The left upper lobe bulla re-identified.  Evidence of CABG.  Heart size mildly enlarged with central vascular congestion but no evidence for edema.  Curvilinear right lower lobe scarring is stable.  Hiatal hernia noted.  No acute osseous finding.  IMPRESSION: Stable right lower lobe scarring.   Original Report Authenticated By: Christiana Pellant, M.D.   Ct Angio Chest Pe W/cm &/or Wo Cm  05/15/2013   *RADIOLOGY REPORT*  Clinical Data: Shortness of breath.  Elevated D-dimer.  Exclude pulmonary embolism.  CT ANGIOGRAPHY CHEST  Technique:  Multidetector CT imaging of the chest using the standard protocol during bolus administration of intravenous contrast. Multiplanar reconstructed images including MIPs were obtained and reviewed to evaluate the vascular anatomy.  Contrast: OMNIPAQUE IOHEXOL 350 MG/ML SOLN  Comparison: 12/22/2009.  Findings:  THORACIC INLET/BODY WALL:  No acute abnormality.  MEDIASTINUM:  Normal heart size.  No pericardial effusion. Extensive coronary artery atherosclerosis, status post CABG.  The upper saphenous and left internal mammary grafts are patent. No acute vascular abnormality.  A low attenuation area within the segmental branch to the anterior right upper lobe is most likely mixing artifact when correlated with reformats.  No adenopathy. Large hiatal hernia.  LUNG WINDOWS:  No consolidation.  No effusion.   No suspicious pulmonary nodule. There is a 8cm bleb in the left apex, with calcification along the medial margin.  Paraseptal emphysema at the right apex additionally.  UPPER ABDOMEN:  No acute findings.  OSSEOUS:  No acute fracture.  No suspicious lytic or blastic lesions. Remote, nonunited left posterolateral rib fracture.  IMPRESSION:   1. Negative for pulmonary embolism or other acute intrathoracic abnormality. 2.  Coronary artery atherosclerosis status post CABG. 3.  Large sliding type hiatal hernia.   Original Report Authenticated By: Tiburcio Pea    Scheduled Meds: . albuterol  2.5 mg Nebulization TID  . antiseptic oral rinse  15 mL Mouth Rinse BID  . atorvastatin  40 mg Oral q1800  . budesonide-formoterol  2 puff Inhalation  BID  . citalopram  10 mg Oral Daily  . dextromethorphan-guaiFENesin  1 tablet Oral BID  . folic acid  2 mg Oral Daily  . insulin aspart  0-5 Units Subcutaneous QHS  . insulin aspart  0-9 Units Subcutaneous TID WC  . ipratropium  0.5 mg Nebulization TID  . iron polysaccharides  150 mg Oral BID  . [START ON 05/18/2013] metFORMIN  500 mg Oral BID WC  . metoprolol tartrate  25 mg Oral BID  . pantoprazole  40 mg Oral BID  . sodium chloride  3 mL Intravenous Q12H   Continuous Infusions:   Principal Problem:   Fatigue Active Problems:   HYPERLIPIDEMIA   HYPERTENSION   DM   COPD (chronic obstructive pulmonary disease)   Anemia   Diastolic HF (heart failure)    Time spent: 30 min    [name]  [facility] Pager [phone]. If 7PM-7AM, please contact night-coverage at [email or web address], password [facility] 05/16/2013, 3:44 PM  LOS: 2 days
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
## 2013-05-16 NOTE — Evaluation (Signed)
Physical Therapy Evaluation Patient Details Name: [name] MRN: [id number] DOB: [birth year] Today's Date: 05/16/2013 Time: 4098-1191 PT Time Calculation (min): 32 min  PT Assessment / Plan / Recommendation History of Present Illness  Pt with fatigue and black watery stools at time of admission.  PT famililar with this pt as he has had several admissions for SOB and COPD exacerbation.  Is home with wife who is at home with him, however does have horse farm that she tends to.    Clinical Impression  Pt is min/guard to supervision with mobility.  Ambulated with RW, however pt states he does not use this at home.  Ambulated to/from restroom without AD at min/guard level as well, however did note increased WOB as we did ambulate without O2 to/from restroom.  See note below.  Pt will benefit from skilled PT in acute venue to address deficits.  PT recommends HHPT for follow up.  Feel that pt would benefit from pulmonary rehab, however doubt that he would go.   SATURATION QUALIFICATIONS: (This note is used to comply with regulatory documentation for home oxygen)  Patient Saturations on Room Air at Rest = 90%  Patient Saturations on Room Air while Ambulating = 86%  Patient Saturations on 2 Liters of oxygen while Ambulating = 97%  Please briefly explain why patient needs home oxygen:  Pt only able to ambulate short distances without O2 and note that he has 3/4 dyspnea with approx 10' amb.  Would benefit from continuous use of O2 to improve activity tolerance at home.     PT Assessment  Patient needs continued PT services    Follow Up Recommendations  Home health PT (would benefit from pulmonary rehab if would attend)    Does the patient have the potential to tolerate intense rehabilitation      Barriers to Discharge        Equipment Recommendations  None recommended by PT    Recommendations for Other Services     Frequency Min 3X/week    Precautions / Restrictions  Precautions Precautions: Fall Precaution Comments: monitor O2 Restrictions Weight Bearing Restrictions: No   Pertinent Vitals/Pain Pain stated in lowback following amb.       Mobility  Bed Mobility Bed Mobility: Supine to Sit Supine to Sit: 6: Modified independent (Device/Increase time) Details for Bed Mobility Assistance: increased time and effort.  Transfers Transfers: Sit to Stand;Stand to Sit Sit to Stand: 5: Supervision;From bed;From toilet Stand to Sit: 5: Supervision;To bed;To toilet Details for Transfer Assistance: Supervision for safety.  Performed x 2 in order to use restroom prior to amb.   Ambulation/Gait Ambulation/Gait Assistance: 4: Min guard Ambulation Distance (Feet): 100 Feet Assistive device: Rolling walker Ambulation/Gait Assistance Details: Min/guard for safety with min cues for upright posture and continued pursed lip breathing.  See O2 note for details.  Gait Pattern: Step-through pattern;Decreased stride length;Trunk flexed Gait velocity: decreased Stairs: No Wheelchair Mobility Wheelchair Mobility: No    Exercises     PT Diagnosis: Difficulty walking;Generalized weakness;Acute pain  PT Problem List: Decreased strength;Decreased activity tolerance;Decreased balance;Decreased mobility;Decreased safety awareness;Decreased knowledge of precautions;Cardiopulmonary status limiting activity;Pain PT Treatment Interventions: DME instruction;Gait training;Stair training;Functional mobility training;Therapeutic exercise;Therapeutic activities;Balance training;Patient/family education     PT Goals(Current goals can be found in the care plan section) Acute Rehab PT Goals Patient Stated Goal: to be able to breath PT Goal Formulation: With patient Time For Goal Achievement: 05/23/13 Potential to Achieve Goals: Good  Visit Information  Last PT Received On:  05/16/13 Assistance Needed: +1 History of Present Illness: Pt with fatigue and black watery stools at  time of admission.  PT famililar with this pt as he has had several admissions for SOB and COPD exacerbation.  Is home with wife who is at home with him, however does have horse farm that she tends to.         Prior Functioning  Home Living Family/patient expects to be discharged to:: Private residence Living Arrangements: Spouse/significant other Available Help at Discharge: Family Type of Home: House Home Access: Stairs to enter Secretary/administrator of Steps: 4 Entrance Stairs-Rails: Right;Left Home Layout: One level Home Equipment: Environmental consultant - 2 wheels;Bedside commode;Cane - single point;Hospital bed Prior Function Level of Independence: Needs assistance Communication Communication: No difficulties Dominant Hand: Right    Cognition  Cognition Arousal/Alertness: Awake/alert Behavior During Therapy: WFL for tasks assessed/performed Overall Cognitive Status: Within Functional Limits for tasks assessed    Extremity/Trunk Assessment Lower Extremity Assessment Lower Extremity Assessment: Overall WFL for tasks assessed Cervical / Trunk Assessment Cervical / Trunk Assessment: Kyphotic   Balance    End of Session PT - End of Session Equipment Utilized During Treatment: Gait belt;Oxygen Activity Tolerance: Patient limited by fatigue Patient left: in bed;with call bell/phone within reach Nurse Communication: Mobility status  GP     Vista Deck 05/16/2013, 3:00 PM

## 2013-05-17 DIAGNOSIS — I2510 Atherosclerotic heart disease of native coronary artery without angina pectoris: Secondary | ICD-10-CM

## 2013-05-17 DIAGNOSIS — K922 Gastrointestinal hemorrhage, unspecified: Secondary | ICD-10-CM

## 2013-05-17 LAB — BASIC METABOLIC PANEL
Chloride: 97 mEq/L (ref 96–112)
GFR calc Af Amer: >90 mL/min (ref >90)
Potassium: 3.7 mEq/L (ref 3.5–5.1)

## 2013-05-17 LAB — GLUCOSE, CAPILLARY
Glucose-Capillary: 232 mg/dL — ABNORMAL HIGH (ref 70–99)
Glucose-Capillary: 252 mg/dL — ABNORMAL HIGH (ref 70–99)

## 2013-05-17 LAB — CBC
HCT: 29.1 % — ABNORMAL LOW (ref 39.0–52.0)
Hemoglobin: 8.1 g/dL — ABNORMAL LOW (ref 13.0–17.0)
WBC: 8.9 10*3/uL (ref 4.0–10.5)

## 2013-05-17 MED ORDER — TIOTROPIUM BROMIDE MONOHYDRATE 18 MCG IN CAPS: 18.0000 ug | ORAL_CAPSULE | Freq: Every day | RESPIRATORY_TRACT | Status: DC

## 2013-05-17 MED ORDER — HYDROCOD POLST-CHLORPHEN POLST 10-8 MG/5ML PO LQCR
5.0000 mL | Freq: Once | ORAL | Status: AC
  Administered 2013-05-17: 5 mL via ORAL
  Filled 2013-05-17: qty 5

## 2013-05-17 NOTE — Progress Notes (Signed)
Subjective: No acute events.    Objective: Vital signs in last 24 hours: Temp:  [97.7 F (36.5 C)-98.1 F (36.7 C)] 97.7 F (36.5 C) (08/01 0543) Pulse Rate:  [82-101] 82 (08/01 0543) Resp:  [16-20] 16 (08/01 0543) BP: (125-153)/(63-82) 153/82 mmHg (08/01 0543) SpO2:  [96 %-99 %] 98 % (08/01 0820) Weight:  [267 lb 1.6 oz (121.156 kg)] 267 lb 1.6 oz (121.156 kg) (08/01 0543) Last BM Date: 05/17/13  Intake/Output from previous day: 07/31 0701 - 08/01 0700 In: 1360 [P.O.:1360] Out: 950 [Urine:950] Intake/Output this shift: Total I/O In: 480 [P.O.:480] Out: 1000 [Urine:1000]  General appearance: alert and no distress GI: soft, non-tender; bowel sounds normal; no masses,  no organomegaly  Lab Results:  Recent Labs  05/15/13 0450 05/16/13 0440 05/17/13 0437  WBC 11.0* 9.0 8.9  HGB 8.2* 8.7* 8.1*  HCT 30.3* 30.7* 29.1*  PLT 268 294 289   BMET  Recent Labs  05/15/13 0450 05/16/13 0440 05/17/13 0437  NA 138 138 135  K 3.7 3.6 3.7  CL 100 98 97  CO2 27 30 28   GLUCOSE 201* 205* 246*  BUN 12 14 12   CREATININE 0.89 1.04 0.95  CALCIUM 8.8 8.9 8.8   LFT No results found for this basename: PROT, ALBUMIN, AST, ALT, ALKPHOS, BILITOT, BILIDIR, IBILI,  in the last 72 hours PT/INR No results found for this basename: LABPROT, INR,  in the last 72 hours Hepatitis Panel No results found for this basename: HEPBSAG, HCVAB, HEPAIGM, HEPBIGM,  in the last 72 hours C-Diff No results found for this basename: CDIFFTOX,  in the last 72 hours Fecal Lactopherrin No results found for this basename: FECLLACTOFRN,  in the last 72 hours  Studies/Results: Ct Angio Chest Pe W/cm &/or Wo Cm  05/15/2013   *RADIOLOGY REPORT*  Clinical Data: Shortness of breath.  Elevated D-dimer.  Exclude pulmonary embolism.  CT ANGIOGRAPHY CHEST  Technique:  Multidetector CT imaging of the chest using the standard protocol during bolus administration of intravenous contrast. Multiplanar reconstructed  images including MIPs were obtained and reviewed to evaluate the vascular anatomy.  Contrast: OMNIPAQUE IOHEXOL 350 MG/ML SOLN  Comparison: 12/22/2009.  Findings:  THORACIC INLET/BODY WALL:  No acute abnormality.  MEDIASTINUM:  Normal heart size.  No pericardial effusion. Extensive coronary artery atherosclerosis, status post CABG.  The upper saphenous and left internal mammary grafts are patent. No acute vascular abnormality.  A low attenuation area within the segmental branch to the anterior right upper lobe is most likely mixing artifact when correlated with reformats.  No adenopathy. Large hiatal hernia.  LUNG WINDOWS:  No consolidation.  No effusion.  No suspicious pulmonary nodule. There is a 8cm bleb in the left apex, with calcification along the medial margin.  Paraseptal emphysema at the right apex additionally.  UPPER ABDOMEN:  No acute findings.  OSSEOUS:  No acute fracture.  No suspicious lytic or blastic lesions. Remote, nonunited left posterolateral rib fracture.  IMPRESSION:   1. Negative for pulmonary embolism or other acute intrathoracic abnormality. 2.  Coronary artery atherosclerosis status post CABG. 3.  Large sliding type hiatal hernia.   Original Report Authenticated By: Tiburcio Pea    Medications:  Scheduled: . albuterol  2.5 mg Nebulization TID  . antiseptic oral rinse  15 mL Mouth Rinse BID  . atorvastatin  40 mg Oral q1800  . budesonide-formoterol  2 puff Inhalation BID  . citalopram  10 mg Oral Daily  . dextromethorphan-guaiFENesin  1 tablet Oral BID  .  folic acid  2 mg Oral Daily  . insulin aspart  0-15 Units Subcutaneous TID WC  . insulin aspart  0-5 Units Subcutaneous QHS  . ipratropium  0.5 mg Nebulization TID  . iron polysaccharides  150 mg Oral BID  . [START ON 05/18/2013] metFORMIN  500 mg Oral BID WC  . metoprolol tartrate  25 mg Oral BID  . pantoprazole  40 mg Oral BID  . sodium chloride  3 mL Intravenous Q12H   Continuous:   Assessment/Plan: 1)  Anemia. 2) COPD.   He remains stable from the GI standpoint.  I spoke with Dr. Malachi Bonds and I agree that pulmonary rehab will benefit the patient.  Plan: 1) No further GI intervention.  Signing off.   LOS: 3 days   [name] D 05/17/2013, 11:49 AM

## 2013-05-17 NOTE — Progress Notes (Signed)
Inpatient Diabetes Program Recommendations  AACE/ADA: New Consensus Statement on Inpatient Glycemic Control (2013)  Target Ranges:  Prepandial:   less than 140 mg/dL      Peak postprandial:   less than 180 mg/dL (1-2 hours)      Critically ill patients:  140 - 180 mg/dL  Results for [name], [name] (MRN [id number]) as of 05/17/2013 10:58  Ref. Range 05/16/2013 11:20 05/16/2013 16:37 05/16/2013 21:14 05/17/2013 01:59 05/17/2013 07:22  Glucose-Capillary Latest Range: 70-99 mg/dL 409 (H) 811 (H) 914 (H) 232 (H) 181 (H)  Elevated postprandial CBGs  Inpatient Diabetes Program Recommendations Insulin - Meal Coverage: consider adding Novolog 4-5 units TID with meals (not to be given if eats <50% per Glycemic Control Order Set) Change diet to carbohydrate modified Thank you  Piedad Climes BSN, RN,CDE Inpatient Diabetes Coordinator [phone] (team pager)

## 2013-05-17 NOTE — Discharge Summary (Signed)
Physician Discharge Summary  [name] [name] BJY:[id number] DOB: 1941-07-02 DOA: 05/14/2013  PCP: Howard Mannan, MD  Admit date: 05/14/2013 Discharge date: 05/17/2013  Recommendations for Outpatient Follow-up:  1. Home health physical therapy 2. Patient to schedule followup appointment with pulmonology for soonest possible 3. Followup gastroenterology within 2 weeks 4. Followup with primary care doctor within one week for repeat CBC.  Discharge Diagnoses:  Principal Problem:   Fatigue Active Problems:   HYPERLIPIDEMIA   HYPERTENSION   DM   COPD (chronic obstructive pulmonary disease)   Anemia   Diastolic HF (heart failure)   Discharge Condition: Stable, improved  Diet recommendation: Diabetic  Wt Readings from Last 3 Encounters:  05/17/13 121.156 kg (267 lb 1.6 oz)  01/31/13 115.214 kg (254 lb)  01/14/13 118.842 kg (262 lb)    History of present illness:  Patient is a 72 year old male with history of hypertension, hyperlipidemia, diabetes, CAD, COPD and previous GI bleeding presents to [facility] ED with main concern of progressively worsening generalized weakness, associated with lightheadedness and exertional shortness of breath. He explains he has noted dark stools but no bright red blood noted. He also explains his stool tends to be darker as he is on iron supplementation. He denies any other specific abdominal or urinary concerns, no chest pain. He reports similar events in the past that were caused by GI bleed and he follows with [name] [name] but has not seen him in the past 4 months. He explains he has require blood transfusions in the past.  In ED, pt reports feeling well, Hg found to be 8.1, Hg in march 2014 was 9.9. TRH asked to admit to telemetry bed for further evaluation.    Hospital Course:   The patient was admitted with lightheadedness and frequent falls. Upon further questioning he admitted that he has been attempting to ambulate around his house without his oxygen and when he  does so he becomes very lightheaded and nearly passes out. His hemoglobin remained relatively stable during this admission it was felt that his anemia was contributing less to his lightheadedness than his progressive COPD.  with ambulation, his oxygen saturations dropped to the low 80s on room air.  On his home oxygen, his oxygen levels remained greater than 90% with ambulation. He had a CTA which was negative for pulmonary embolism. He continued on his inhaled corticosteroid and long-acting beta agonist and I added Spiriva. He should continue using albuterol as needed at home. He should followup with pulmonology as soon as possible. He will need to have further testing done in clinic prior to being able to be referred to pulmonary rehabilitation, however he would probably benefit from this.  Acute blood loss anemia of unclear source. He is followed by gastroenterology, [name] [name].  He had an endoscopy done in February of this year that demonstrated gastric and duodenal polyps which were thought to be a source of bleeding at that time. He is to followup in clinic however he has missed his appointment due to an admission in the hospital. Prior to admission he had had some soft black stools, however, his hemoglobin has remained relatively stable. He will need either repeat CBC done in approximately one week, and I have encouraged him to continue using his iron.  Deconditioning, likely due to progressive COPD and shortness of breath. He was seen by physical therapy recommended home health physical therapy but no new equipment.  Hypertension, blood pressure stable to mildly elevated. He continued his home metoprolol.  Type 2 diabetes and hemoglobin A1c of 7. He continue metformin and he had sliding scale insulin added while in the hospital.  COPD with diminished breath sounds but without acute exacerbation. Likely progressive COPD. See above.  Chronic diastolic heart failure. His last echo was in  February with and demonstrated this colic function which was preserved and grade 1 diastolic dysfunction.  He appeared euvolemic.  Hyperlipidemia, stable. Continue statin.  Consultants:  Gastroenterology Procedures:  chest x-ray Antibiotics:  none   Discharge Exam: Filed Vitals:   05/17/13 0543  BP: 153/82  Pulse: 82  Temp: 97.7 F (36.5 C)  Resp: 16   Filed Vitals:   05/16/13 2026 05/16/13 2125 05/17/13 0543 05/17/13 0820  BP:  137/63 153/82   Pulse:  89 82   Temp:  97.9 F (36.6 C) 97.7 F (36.5 C)   TempSrc:  Oral Oral   Resp:  20 16   Height:      Weight:   121.156 kg (267 lb 1.6 oz)   SpO2: 96% 98% 97% 98%    General: obese Caucasian male, No acute distress  HEENT: NCAT, MMM  Cardiovascular: RRR, nl S1, S2 no mrg, 2+ pulses, warm extremities  Respiratory: diminished breath sounds bilaterally but improved from yesterday, without focal rales or rhonchi, no increased WOB  Abdomen: NABS, soft, nontender, nondistended  MSK: Normal tone and bulk, no LEE  Neuro: Grossly intact   Discharge Instructions      Discharge Orders   Future Appointments Provider Department Dept Phone   05/21/2013 10:15 AM Howard Leash, MD [facility] PULMONARY [place]  [phone]   10/21/2013 10:30 AM [facility] [phone]   Eat a light meal the night before the exam but please avoid gaseous foods.   Nothing to eat or drink for at least 8 hours prior to the exam. No gum chewing or smoking the morning of the exam. Please take your morning medications with small sips of water, especially blood pressure medication. If you have several vascular lab exams and will see physician, please bring a snack with you.   10/21/2013 11:00 AM Howard Bio, NP [facility] [place] [phone]   Future Orders Complete By Expires     (HEART FAILURE PATIENTS) Call MD:  Anytime you have any of the following symptoms: 1) 3 pound weight gain  in 24 hours or 5 pounds in 1 week 2) shortness of breath, with or without a dry hacking cough 3) swelling in the hands, feet or stomach 4) if you have to sleep on extra pillows at night in order to breathe.  As directed     Call MD for:  difficulty breathing, headache or visual disturbances  As directed     Call MD for:  extreme fatigue  As directed     Call MD for:  hives  As directed     Call MD for:  persistant dizziness or light-headedness  As directed     Call MD for:  persistant nausea and vomiting  As directed     Call MD for:  severe uncontrolled pain  As directed     Call MD for:  temperature >100.4  As directed     Diet Carb Modified  As directed     Discharge instructions  As directed     Comments:      You were hospitalized with lightheadedness and fatigue. Other you are anemic, your shortness of breath and lightheadedness  are probably due to your COPD. I have added Spiriva to your home medications. Please continue to take all of your other medications as previously prescribed. Spiriva should help prevent worsening of your breathing. It is critically important that you use your oxygen all the time particularly when you are up and moving around.  We will arrange for physical therapy and a home health nurse to come out to your house as soon as possible. Please ask the home health nurse about your glucometer to make sure that it is working properly. You will need to see your pulmonologist next Tuesday, an appointment is already scheduled for 10:15 AM.  Please arrive 15 minutes early for your appointment. Please ask your pulmonologist about pulmonary rehabilitation.    Increase activity slowly  As directed         Medication List         albuterol 108 (90 BASE) MCG/ACT inhaler  Commonly known as:  PROVENTIL HFA  Inhale 2 puffs into the lungs every 4 (four) hours as needed for shortness of breath. Shortness of breath     ALPRAZolam 0.25 MG tablet  Commonly known as:  XANAX  Take 0.25  mg by mouth 3 (three) times daily as needed for anxiety.     aspirin 81 MG chewable tablet  Chew 81 mg by mouth daily. Resume when it is okay by Dr. hung     atorvastatin 40 MG tablet  Commonly known as:  LIPITOR  Take 40 mg by mouth daily.     budesonide-formoterol 160-4.5 MCG/ACT inhaler  Commonly known as:  SYMBICORT  Inhale 2 puffs into the lungs 2 (two) times daily.     citalopram 10 MG tablet  Commonly known as:  CELEXA  Take 10-20 mg by mouth daily.     folic acid 1 MG tablet  Commonly known as:  FOLVITE  Take 2 mg by mouth daily.     HYDROcodone-acetaminophen 5-325 MG per tablet  Commonly known as:  NORCO/VICODIN  Take 1 tablet by mouth every 6 (six) hours as needed for pain.     iron polysaccharides 150 MG capsule  Commonly known as:  NIFEREX  Take 150 mg by mouth 2 (two) times daily.     metFORMIN 500 MG tablet  Commonly known as:  GLUCOPHAGE  Take 1 tablet (500 mg total) by mouth 2 (two) times daily with a meal.     metoprolol tartrate 25 MG tablet  Commonly known as:  LOPRESSOR  Take 25 mg by mouth 2 (two) times daily.     MUCINEX DM 30-600 MG Tb12  Take 30-600 mg by mouth 2 (two) times daily.     pantoprazole 40 MG tablet  Commonly known as:  PROTONIX  Take 40 mg by mouth 2 (two) times daily.     tiotropium 18 MCG inhalation capsule  Commonly known as:  SPIRIVA HANDIHALER  Place 1 capsule (18 mcg total) into inhaler and inhale daily.     zolpidem 10 MG tablet  Commonly known as:  AMBIEN  Take 10 mg by mouth at bedtime as needed for sleep.       Follow-up Information   Follow up with Howard Mannan, MD.   Contact information:   [address] [place] [address] [phone]       Follow up with Theda Belfast, MD.   Contact information:   [address] [place] [address] [phone]       Follow up with MCQUAID,  Riley Lam, MD On 05/21/2013. (10:15AM.  Please arrive 15 minutes early.  )    Contact information:   [address] [place] [address] [phone]        The results of significant diagnostics from this hospitalization (including imaging, microbiology, ancillary and laboratory) are listed below for reference.    Significant Diagnostic Studies: Dg Chest 2 View  05/14/2013   *RADIOLOGY REPORT*  Clinical Data: Shortness of breath  CHEST - 2 VIEW  Comparison: 12/04/2012  Findings: The left upper lobe bulla re-identified.  Evidence of CABG.  Heart size mildly enlarged with central vascular congestion but no evidence for edema.  Curvilinear right lower lobe scarring is stable.  Hiatal hernia noted.  No acute osseous finding.  IMPRESSION: Stable right lower lobe scarring.   Original Report Authenticated By: Christiana Pellant, M.D.   Ct Angio Chest Pe W/cm &/or Wo Cm  05/15/2013   *RADIOLOGY REPORT*  Clinical Data: Shortness of breath.  Elevated D-dimer.  Exclude pulmonary embolism.  CT ANGIOGRAPHY CHEST  Technique:  Multidetector CT imaging of the chest using the standard protocol during bolus administration of intravenous contrast. Multiplanar reconstructed images including MIPs were obtained and reviewed to evaluate the vascular anatomy.  Contrast: OMNIPAQUE IOHEXOL 350 MG/ML SOLN  Comparison: 12/22/2009.  Findings:  THORACIC INLET/BODY WALL:  No acute abnormality.  MEDIASTINUM:  Normal heart size.  No pericardial effusion. Extensive coronary artery atherosclerosis, status post CABG.  The upper saphenous and left internal mammary grafts are patent. No acute vascular abnormality.  A low attenuation area within the segmental branch to the anterior right upper lobe is most likely mixing artifact when correlated with reformats.  No adenopathy. Large hiatal hernia.  LUNG WINDOWS:  No consolidation.  No effusion.  No suspicious pulmonary nodule. There is a 8cm bleb in the left apex, with calcification along the medial margin.  Paraseptal emphysema at the right apex additionally.  UPPER  ABDOMEN:  No acute findings.  OSSEOUS:  No acute fracture.  No suspicious lytic or blastic lesions. Remote, nonunited left posterolateral rib fracture.  IMPRESSION:   1. Negative for pulmonary embolism or other acute intrathoracic abnormality. 2.  Coronary artery atherosclerosis status post CABG. 3.  Large sliding type hiatal hernia.   Original Report Authenticated By: Tiburcio Pea    Microbiology: No results found for this or any previous visit (from the past 240 hour(s)).   Labs: Basic Metabolic Panel:  Recent Labs Lab 05/14/13 2055 05/15/13 0450 05/16/13 0440 05/17/13 0437  NA 136 138 138 135  K 4.3 3.7 3.6 3.7  CL 98 100 98 97  CO2 24 27 30 28   GLUCOSE 189* 201* 205* 246*  BUN 10 12 14 12   CREATININE 0.81 0.89 1.04 0.95  CALCIUM 8.7 8.8 8.9 8.8   Liver Function Tests: No results found for this basename: AST, ALT, ALKPHOS, BILITOT, PROT, ALBUMIN,  in the last 168 hours No results found for this basename: LIPASE, AMYLASE,  in the last 168 hours No results found for this basename: AMMONIA,  in the last 168 hours CBC:  Recent Labs Lab 05/14/13 2130 05/15/13 0450 05/16/13 0440 05/17/13 0437  WBC 8.9 11.0* 9.0 8.9  NEUTROABS 7.1  --   --   --   HGB 8.1* 8.2* 8.7* 8.1*  HCT 28.5* 30.3* 30.7* 29.1*  MCV 80.3 81.0 79.9 80.2  PLT 255 268 294 289   Cardiac Enzymes:  Recent Labs Lab 05/15/13 1753  TROPONINI <0.30   BNP:  BNP (last 3 results)  Recent Labs  12/04/12 1335 05/14/13 2055  PROBNP 1065.0* 327.8*   CBG:  Recent Labs Lab 05/16/13 1637 05/16/13 2114 05/17/13 0159 05/17/13 0722 05/17/13 1127  GLUCAP 350* 222* 232* 181* 252*    Time coordinating discharge: 45 minutes  Signed:  Kathye Cipriani  [facility] 05/17/2013, 12:09 PM

## 2013-05-21 ENCOUNTER — Encounter: Payer: Self-pay | Admitting: Pulmonary Disease

## 2013-05-21 ENCOUNTER — Ambulatory Visit (INDEPENDENT_AMBULATORY_CARE_PROVIDER_SITE_OTHER): Payer: Medicare Other | Admitting: Pulmonary Disease

## 2013-05-21 VITALS — BP 126/74 | HR 72 | Temp 97.5°F | Ht 75.0 in | Wt 263.0 lb

## 2013-05-21 DIAGNOSIS — J9611 Chronic respiratory failure with hypoxia: Secondary | ICD-10-CM | POA: Insufficient documentation

## 2013-05-21 DIAGNOSIS — J449 Chronic obstructive pulmonary disease, unspecified: Secondary | ICD-10-CM

## 2013-05-21 DIAGNOSIS — J9610 Chronic respiratory failure, unspecified whether with hypoxia or hypercapnia: Secondary | ICD-10-CM

## 2013-05-21 MED ORDER — TIOTROPIUM BROMIDE MONOHYDRATE 18 MCG IN CAPS: 18.0000 ug | ORAL_CAPSULE | Freq: Every day | RESPIRATORY_TRACT | Status: DC

## 2013-05-21 NOTE — Assessment & Plan Note (Signed)
Continue 2L continuously 

## 2013-05-21 NOTE — Assessment & Plan Note (Signed)
It sounds like [name]'s admission last week was for lightheadedness in the setting of hypoxemia from not using his oxygen regularly.  It doesn't sound like he has necessarily gotten worse from a COPD standpoint, just more deconditioned.  So I'm not really sure if the spiriva is going to help or not as much as he needs to just start using his oxygen regularly.  The large bleb in his LUL and the hiatal hernia really don't help his breathing at all as they effectively lead to restriction in addition to obstruction.  He is profoundly deconditioned and has always adamantly refused pulmonary rehab.  He at least want to exercise at home regularly now.  Plan: -use oxygen regularly, all the time -exercise regularly, start slow, gradually increase -continue symbicort -continue spiriva for now, could likely d/c if no improvement on next visit

## 2013-05-21 NOTE — Progress Notes (Signed)
Subjective:    Patient ID: [name], male    DOB: 1941/05/25, 72 y.o.   MRN: [id number]  Synopsis: Severe COPD per FEV1 44% in 2011.  Quit smoking in early 2011 after long smoking history.  Started on O2 in 2013 with chronic GI bleeding.  HPI  03/30/2011 H&P ([facility]) -- Severe COPD:  71 yowm former patient of Dr Shelle Iron last seen approx one year ago for COPD/emphysema. He has been tried on multiple inhaled medications including Advair, Symbicort and Spiriva all with no discernible benefit.  Reportedly told by Dr Shelle Iron that there was nothing more that can be done. Presents again for "second opinion" and reports continued severe DOE (class III symptoms). He has episodes of panic assoc with the dyspnea and has been prescribed Xanax by his primary MD.  Has stopped smoking since last seen by Dr Shelle Iron (quit 5 mos ago). Denies exertional CP but does have occasional epigastric pain. No history of hemoptysis, LE edema or calf tenderness. Intermittent cough is mostly NP.  09/13/11 ROV Severe COPD:  Returns today, "to have you refill my symbicort and albuterol".  He says that these meds have made a big difference in his shortness of breath.  He still uses albuterol about twice per week for dyspnea, mostly from exertion.  He does not have environmental triggers.  He has a rare cough productive of scant clear sputum.  He has not started to exercise and is adamant that there is no benefit to pulmonary rehab.  He has gained weight since our last visit.  07/20/12 ROV -- [name] has had a rough several months with recurrent GI bleeding. Apparently the bleeding has been localized to the small bowel. He states that his most recent hemoglobin was 12 which is as high as it's been. He was hospitalized 2 is not 3 times in the last several months for GI bleeding. During one of these hospitalizations he was started on 2.5 liters of oxygen continuously. He says that when he is at rest he doesn't feel short of  breath but he needs the oxygen when he gets up and moves around. He has not had cough. He continues to be compliant with his Symbicort which he says helps quite a bit. He says that his acid reflux is well controlled especially now during his hospital bed where he can probably that of his bed.  11/06/2012 ROV -- [name] has been feeling more short of breath lately which he attributes to a slow recovery from his bleeding last summer.  He has not noticed more blood in his stool or dark stools.  He is not exercising regularly because he doesn't have the energy for it.  He refuses pulmonary rehab.  He states that he is more short of breath when he bends over and sometimes when he lies back in bed.  No new change in leg swelling.  He continues to have intermittent chest pain which lasts for a few seconds to 2-3 minutes and then passes.  This occurs at rest and with exertion. He does not have a new cough or sinus symptoms.  He continues to use and benefit from his home oxygen.   05/21/2013 ROV >> [name] was hospitalized last week for presyncope which appears to be due to the fact that he didn't use his oxygen regularly at home.  He had a decent work up which didn't show any new abnormalities.  His Hgb was 8.  He has not been bleeding or coughing  more.  He has been sleeping too much throughout the day and admits that he has not been using his oxygen enough.   Past Medical History  Diagnosis Date  . Allergic rhinitis, cause unspecified   . Other and unspecified hyperlipidemia   . 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 Unspecified essential hypertension   . 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 Severe bullous disease of LUL. Prolonged intubation after surgeries due to COPD  . 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 Anemia     Recurrent  microcytic, presumably GI   . Complication of anesthesia     trouble getting off ventilator  . Memory loss   . Diabetes      Review of Systems  Constitutional: Negative for fever, chills, activity change, appetite change and unexpected weight change.  HENT: Negative for congestion, rhinorrhea, sneezing and postnasal drip.   Respiratory: Positive for shortness of breath. Negative for cough and choking.   Cardiovascular: Positive for chest pain. Negative for leg swelling.       Objective:   Physical Exam   Filed Vitals:   05/21/13 1049  BP: 126/74  Pulse: 72  Temp: 97.5 F (36.4 C)  TempSrc: Oral  Height: 6\' 3"  (1.905 m)  Weight: 263 lb (119.296 kg)  SpO2: 94%  O2 2 L  Gen: chronically ill appearing, no acute distress HEENT: NCAT, EOMi, OP clear, neck supple without masses PULM: No wheeze, crackles in bases again heard CV: RRR, no mgr, no JVD AB: BS+, soft, nontender, no hsm Ext: warm, no edema, no clubbing, no cyanosis  2010 simple spirometry with FEV1 of 1.41L 44% predicted clear obstruction and flow volume loop 2014 simple spirometry FEV1 1.3 L, clear obstruction     Assessment & Plan:   COPD (chronic obstructive pulmonary disease) It sounds like [name]'s admission last week was for lightheadedness in the setting of hypoxemia from not using his oxygen regularly.  It doesn't sound like he has necessarily gotten worse from a COPD standpoint, just more deconditioned.  So I'm not really sure if the spiriva is going to help or not as much as he needs to just start using his oxygen regularly.  The large bleb in his LUL and the hiatal hernia really don't help his breathing at all as they effectively lead to restriction in addition to obstruction.  He is profoundly deconditioned and has always adamantly refused pulmonary rehab.  He at least want to exercise at home regularly now.  Plan: -use oxygen regularly, all the time -exercise regularly, start slow, gradually  increase -continue symbicort -continue spiriva for now, could likely d/c if no improvement on next visit  Chronic hypoxemic respiratory failure Continue 2 L continuously    Updated Medication List Outpatient Encounter Prescriptions as of 05/21/2013  Medication Sig Dispense Refill  . albuterol (PROVENTIL HFA) 108 (90 BASE) MCG/ACT inhaler Inhale 2 puffs into the lungs every 4 (four) hours as needed for shortness of breath. Shortness of breath  1 Inhaler  3  . ALPRAZolam (XANAX) 0.25 MG tablet Take 0.25 mg by mouth 3 (three) times daily as needed for anxiety.      Marland Kitchen aspirin 81 MG chewable tablet Chew 81 mg by mouth daily. Resume when it is okay by [name]      . atorvastatin (  LIPITOR) 40 MG tablet Take 40 mg by mouth daily.      . budesonide-formoterol (SYMBICORT) 160-4.5 MCG/ACT inhaler Inhale 2 puffs into the lungs 2 (two) times daily.      . citalopram (CELEXA) 10 MG tablet Take 10-20 mg by mouth daily.      Marland Kitchen Dextromethorphan-Guaifenesin (MUCINEX DM) 30-600 MG TB12 Take 30-600 mg by mouth 2 (two) times daily.  28 each  0  . folic acid (FOLVITE) 1 MG tablet Take 2 mg by mouth daily.      Marland Kitchen HYDROcodone-acetaminophen (NORCO/VICODIN) 5-325 MG per tablet Take 1 tablet by mouth every 6 (six) hours as needed for pain.  30 tablet  0  . iron polysaccharides (NIFEREX) 150 MG capsule Take 150 mg by mouth 2 (two) times daily.      . metFORMIN (GLUCOPHAGE) 500 MG tablet Take 1 tablet (500 mg total) by mouth 2 (two) times daily with a meal.  180 tablet  1  . metoprolol tartrate (LOPRESSOR) 25 MG tablet Take 25 mg by mouth 2 (two) times daily.      . pantoprazole (PROTONIX) 40 MG tablet Take 40 mg by mouth 2 (two) times daily.      Marland Kitchen zolpidem (AMBIEN) 10 MG tablet Take 10 mg by mouth at bedtime as needed for sleep.      Marland Kitchen tiotropium (SPIRIVA HANDIHALER) 18 MCG inhalation capsule Place 1 capsule (18 mcg total) into inhaler and inhale daily.  30 capsule  0   No facility-administered encounter medications  on file as of 05/21/2013.

## 2013-05-21 NOTE — Patient Instructions (Addendum)
Take the symbicort twice a day and the spiriva daily Exercise regulalry  We will see you back in 3 months

## 2013-06-03 DIAGNOSIS — E119 Type 2 diabetes mellitus without complications: Secondary | ICD-10-CM

## 2013-06-03 DIAGNOSIS — I10 Essential (primary) hypertension: Secondary | ICD-10-CM

## 2013-06-03 DIAGNOSIS — J449 Chronic obstructive pulmonary disease, unspecified: Secondary | ICD-10-CM

## 2013-06-03 DIAGNOSIS — I5030 Unspecified diastolic (congestive) heart failure: Secondary | ICD-10-CM

## 2013-06-04 ENCOUNTER — Other Ambulatory Visit: Payer: Self-pay | Admitting: Family Medicine

## 2013-06-04 NOTE — Telephone Encounter (Signed)
Refill called to [facility]. 

## 2013-06-06 ENCOUNTER — Other Ambulatory Visit: Payer: Self-pay | Admitting: Family Medicine

## 2013-06-07 NOTE — Telephone Encounter (Signed)
Refill called to [facility]. 

## 2013-06-10 IMAGING — CR DG CHEST 2V
2 series · 2 of 2 positions shown · non-contrast
Comparison: 05/20/2012

CLINICAL DATA: COPD, history coronary disease post MI

CHEST - 2 VIEW

[view not recorded (1 of 2)]
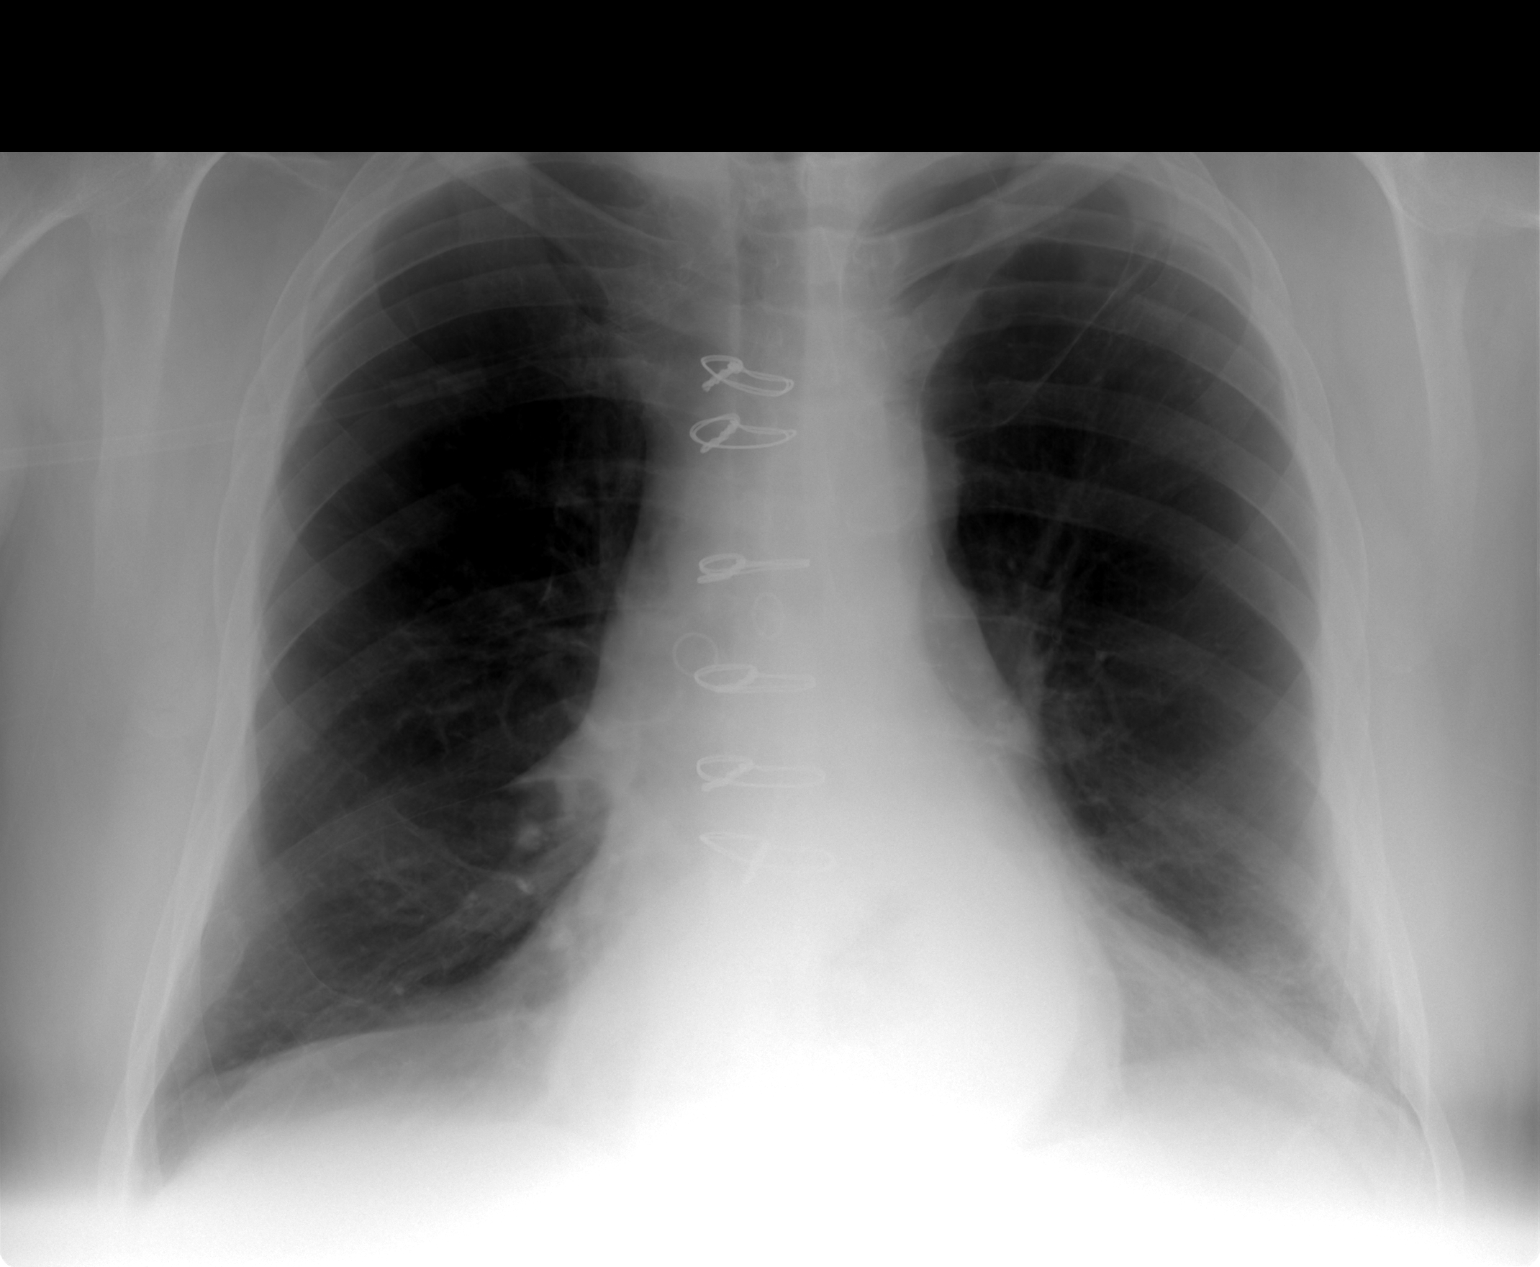

[view not recorded (2 of 2)]
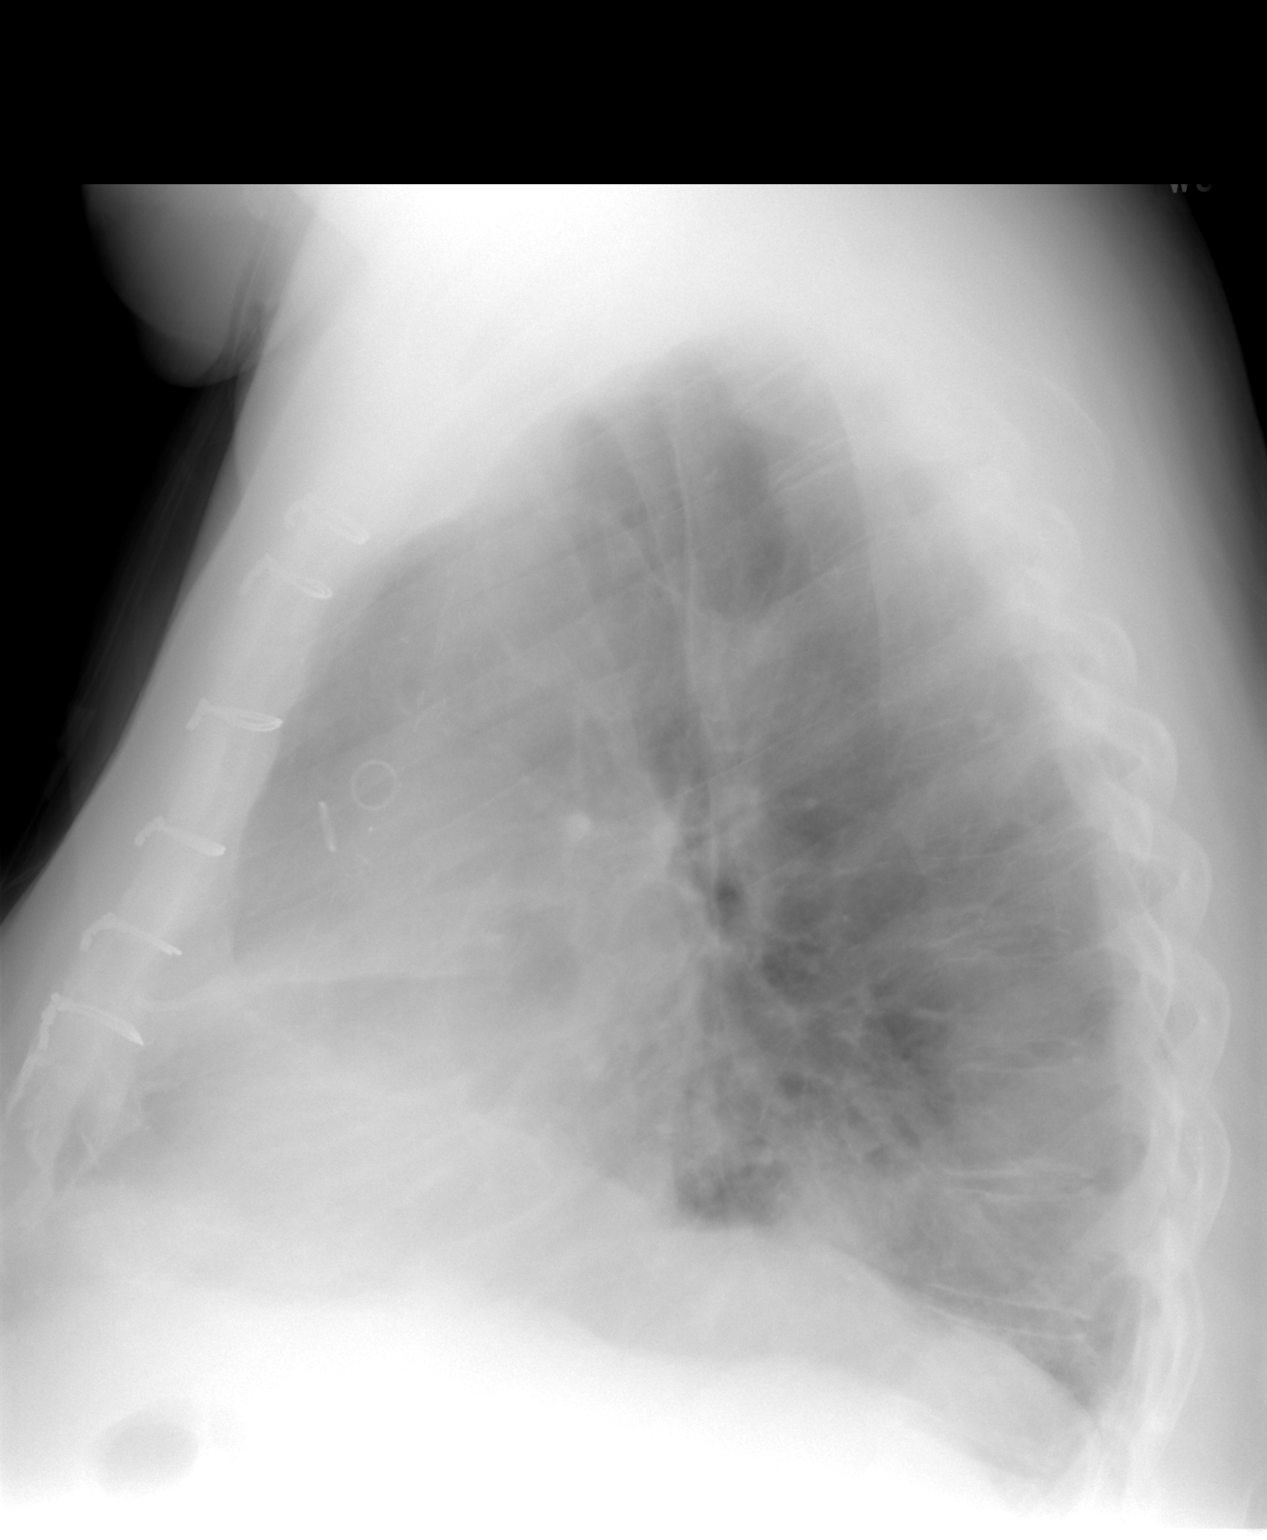

[2 of 2 positions shown; findings below may reference images not displayed]

FINDINGS: Borderline enlargement of cardiac silhouette post CABG.
Large hiatal hernia.
Atherosclerotic calcification aorta.
Lungs appear emphysematous with stable left upper lobe scarring.
No pulmonary infiltrate, pleural effusion or pneumothorax.
No acute osseous findings.
IMPRESSION: Borderline enlargement of cardiac silhouette post CABG.
Large hiatal hernia.
COPD changes with left upper lobe scarring.

## 2013-06-22 ENCOUNTER — Other Ambulatory Visit: Payer: Self-pay | Admitting: Family Medicine

## 2013-06-24 ENCOUNTER — Other Ambulatory Visit (HOSPITAL_COMMUNITY): Payer: Self-pay | Admitting: Family Medicine

## 2013-07-03 ENCOUNTER — Other Ambulatory Visit (HOSPITAL_COMMUNITY): Payer: Self-pay | Admitting: Gastroenterology

## 2013-07-04 ENCOUNTER — Other Ambulatory Visit: Payer: Self-pay | Admitting: Family Medicine

## 2013-07-04 ENCOUNTER — Ambulatory Visit (HOSPITAL_COMMUNITY)
Admission: RE | Admit: 2013-07-04 | Discharge: 2013-07-04 | Disposition: A | Discharge status: Home / Self Care | Payer: Medicare Other | Source: Ambulatory Visit | Attending: Gastroenterology | Admitting: Gastroenterology

## 2013-07-04 DIAGNOSIS — D509 Iron deficiency anemia, unspecified: Secondary | ICD-10-CM | POA: Insufficient documentation

## 2013-07-04 NOTE — Telephone Encounter (Signed)
Refill called to [facility]. 

## 2013-07-05 ENCOUNTER — Encounter (HOSPITAL_COMMUNITY): Payer: Self-pay

## 2013-07-05 ENCOUNTER — Ambulatory Visit (HOSPITAL_COMMUNITY)
Admission: RE | Admit: 2013-07-05 | Discharge: 2013-07-05 | Disposition: A | Discharge status: Home / Self Care | Payer: Medicare Other | Source: Ambulatory Visit | Attending: Gastroenterology | Admitting: Gastroenterology

## 2013-07-05 LAB — PREPARE RBC (CROSSMATCH)

## 2013-07-05 MED ORDER — HYDROCODONE-ACETAMINOPHEN 5-325 MG PO TABS
1.0000 | ORAL_TABLET | ORAL | Status: DC | PRN
  Administered 2013-07-05: 1 via ORAL
  Filled 2013-07-05: qty 1

## 2013-07-05 MED ORDER — SODIUM CHLORIDE 0.9 % IV SOLN: 500.0000 mL | INTRAVENOUS | Status: DC

## 2013-07-08 LAB — TYPE AND SCREEN
Unit division: 0
Unit division: 0

## 2013-07-08 IMAGING — CR DG CHEST 1V
2 series · 2 of 2 positions shown · non-contrast
Comparison: 11/06/2012.

CLINICAL DATA: Shortness of breath.

CHEST - 1 VIEW

[x chest ap (1 of 2)]
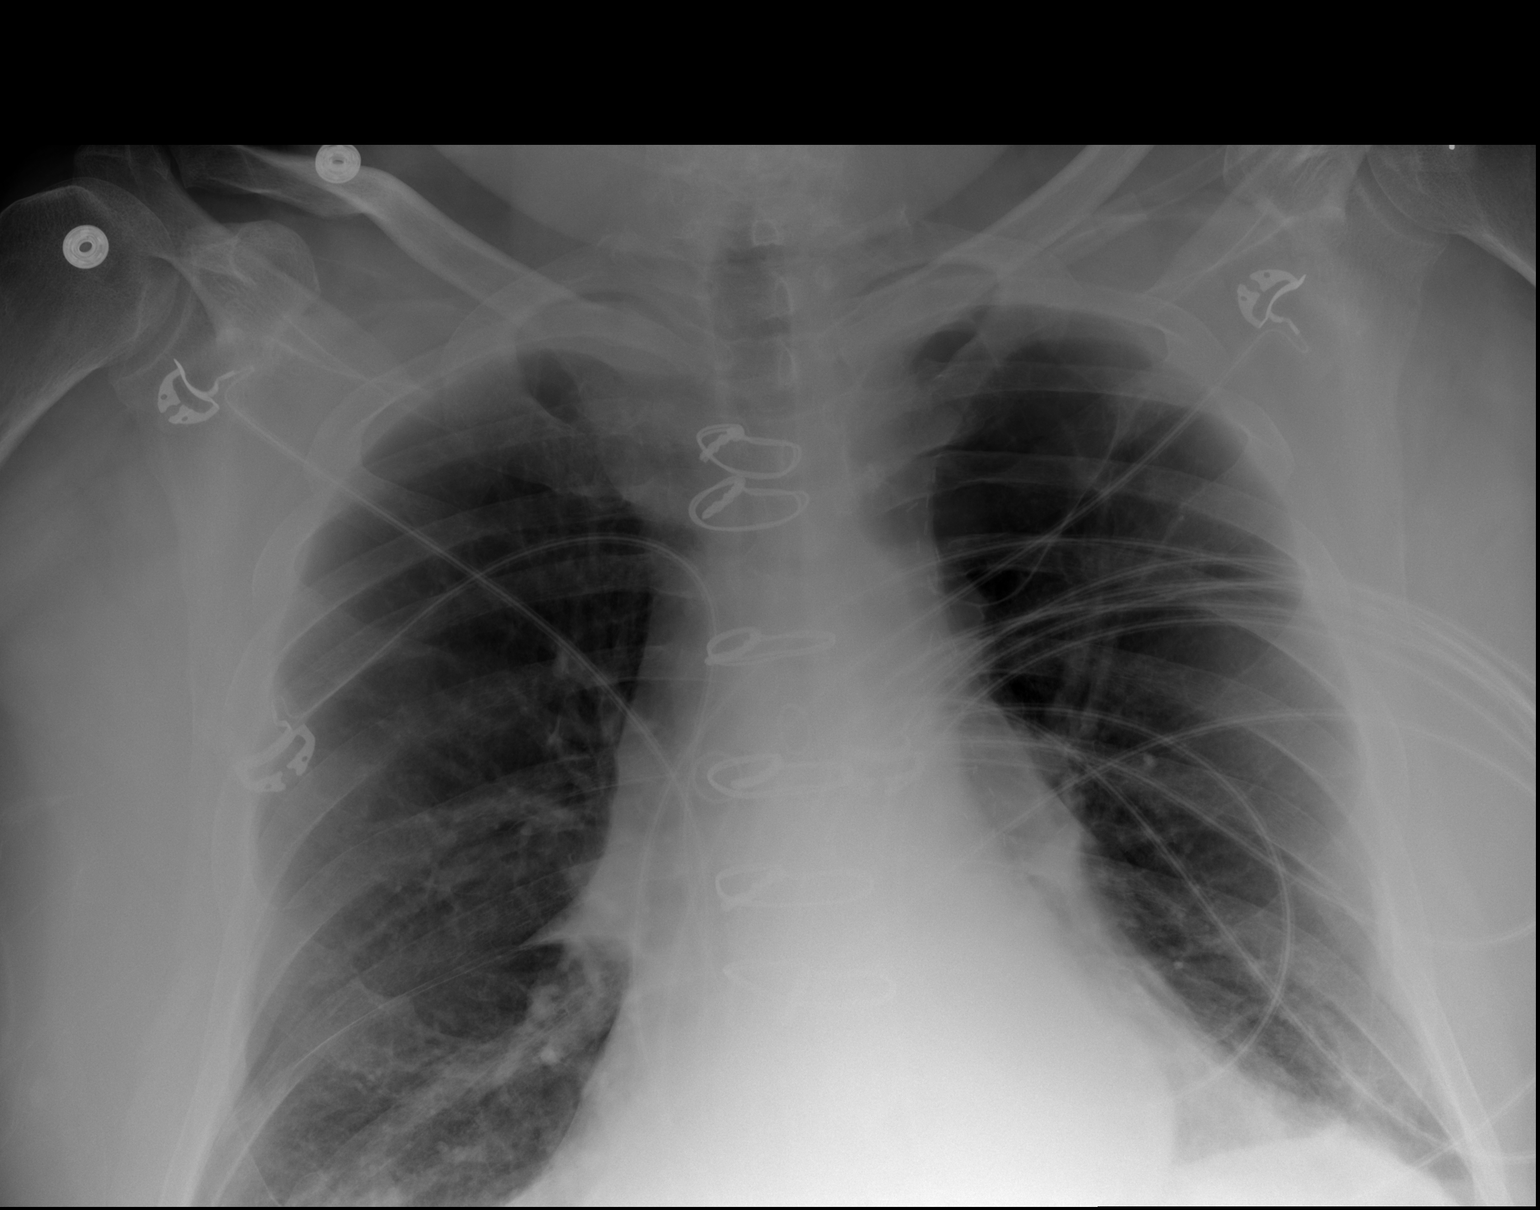

[x chest ap (2 of 2)]
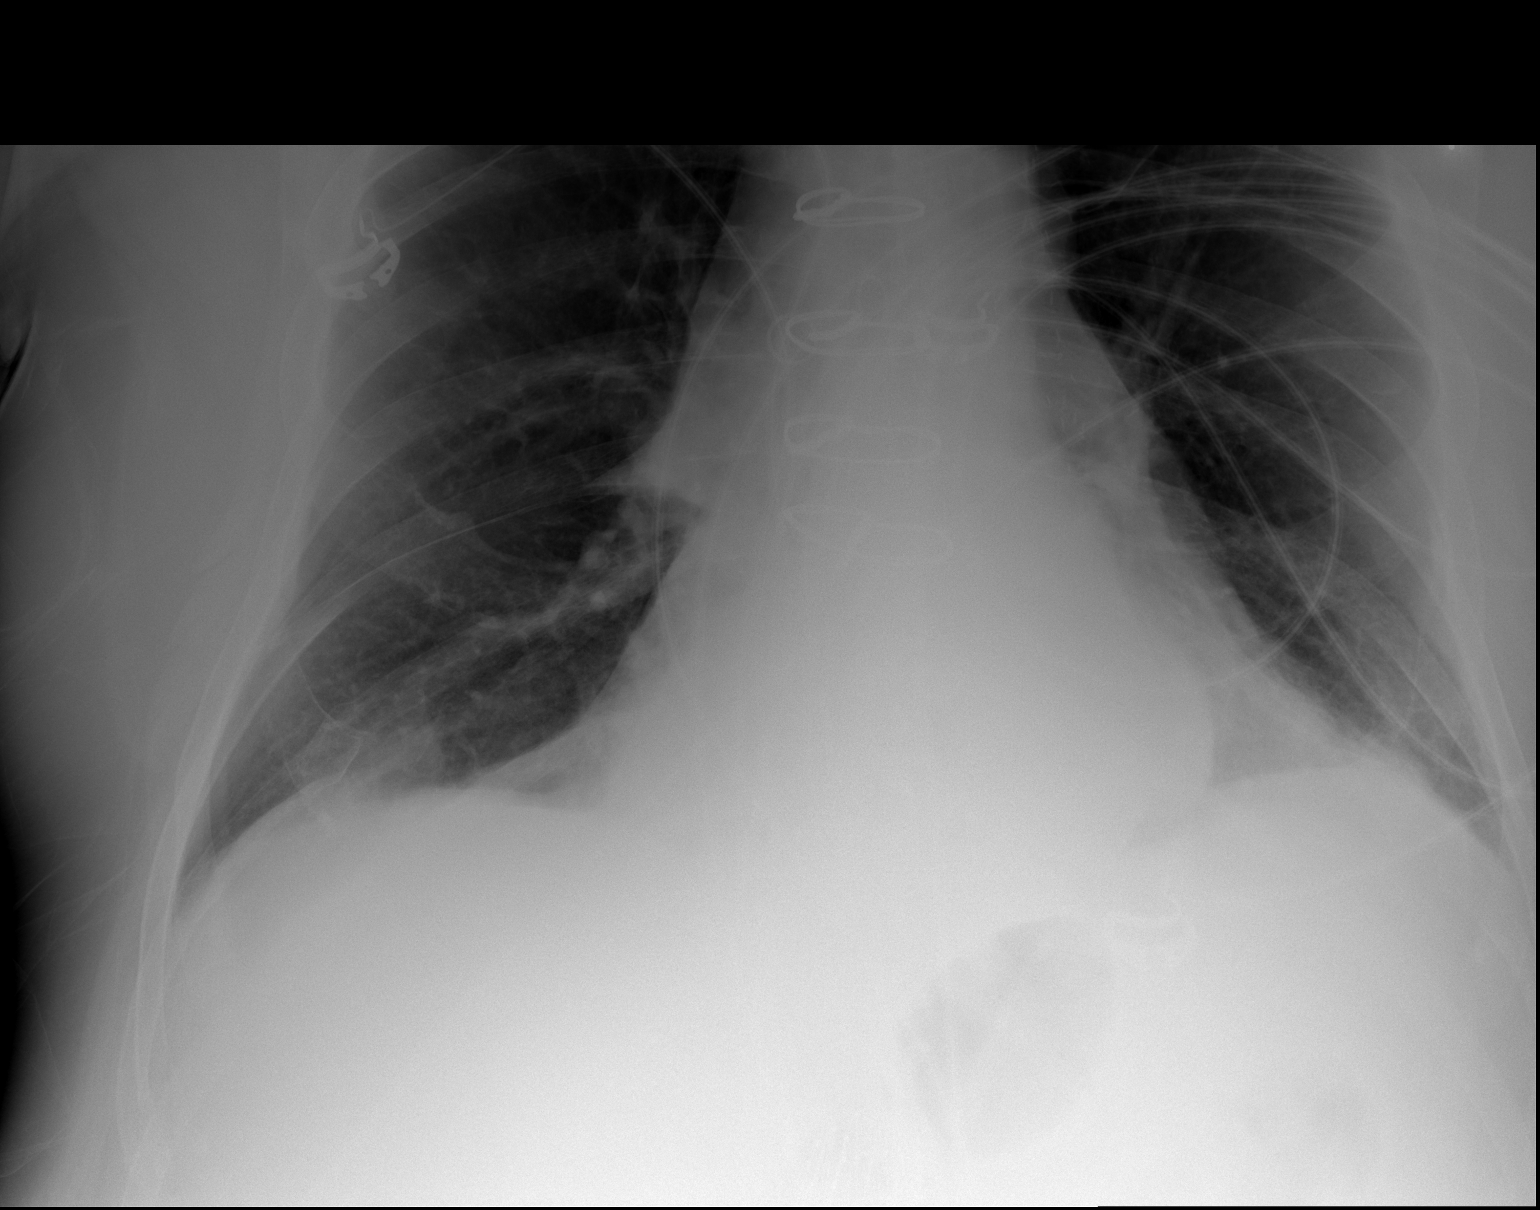

[2 of 2 positions shown; findings below may reference images not displayed]

FINDINGS: The cardiac silhouette, mediastinal and hilar contours
are within normal limits and stable.  Stable surgical changes from
bypass surgery.  Bibasilar atelectasis is noted.  No pleural
effusion or pulmonary edema.  Left apical bulla is stable.  The
bony thorax is intact.
IMPRESSION: 1.  Bibasilar atelectasis.
2.  No infiltrates, edema or effusions.

## 2013-07-08 IMAGING — CR DG KNEE COMPLETE 4+V*R*
4 series · 4 of 4 positions shown · non-contrast
Comparison: None

CLINICAL DATA: Right knee pain

RIGHT KNEE - COMPLETE 4+ VIEW

[x knee ap right (1 of 4)]
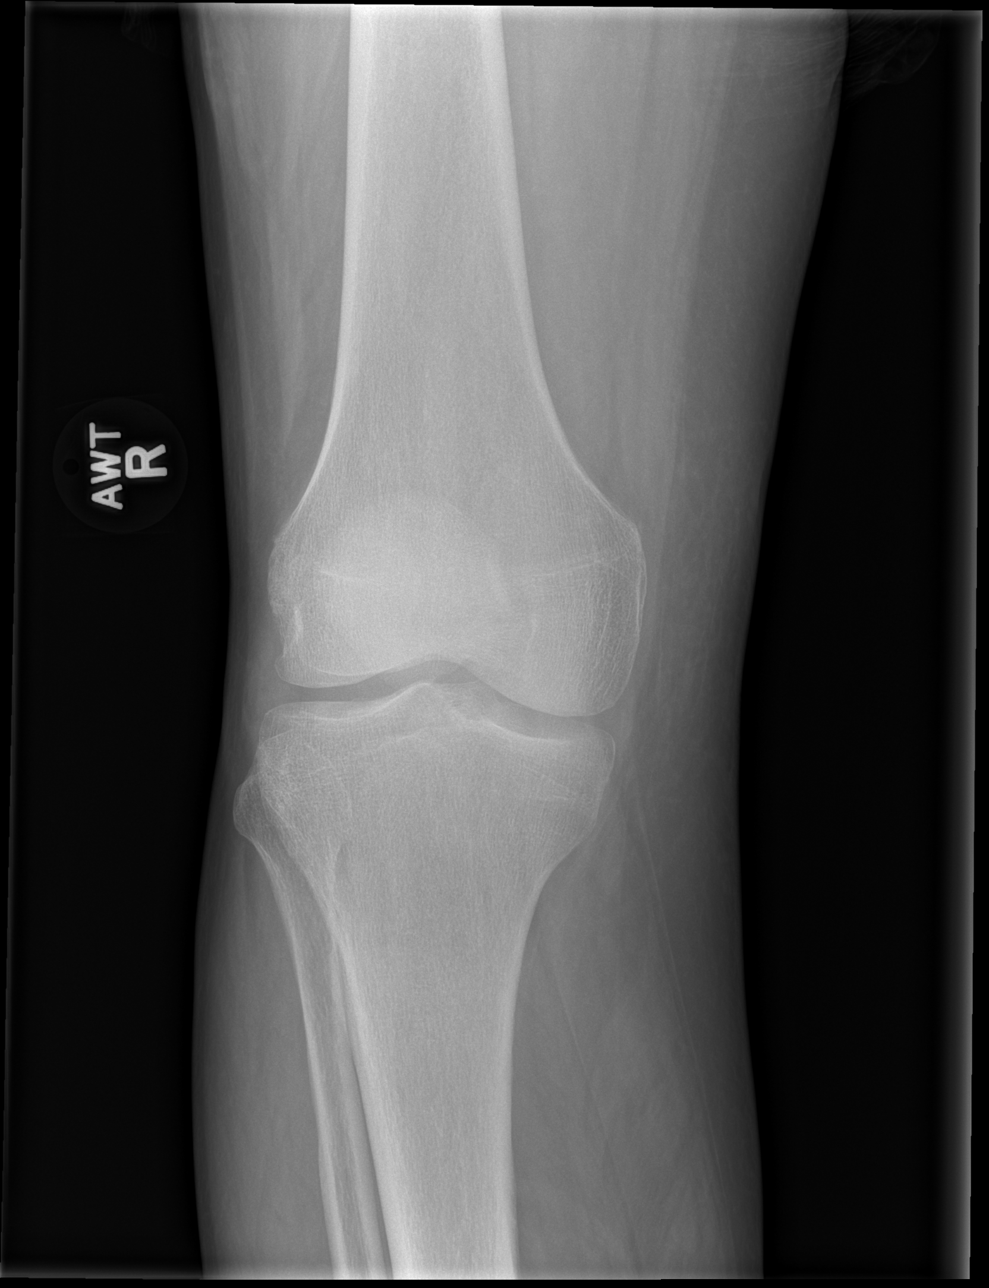

[x knee ap right (2 of 4)]
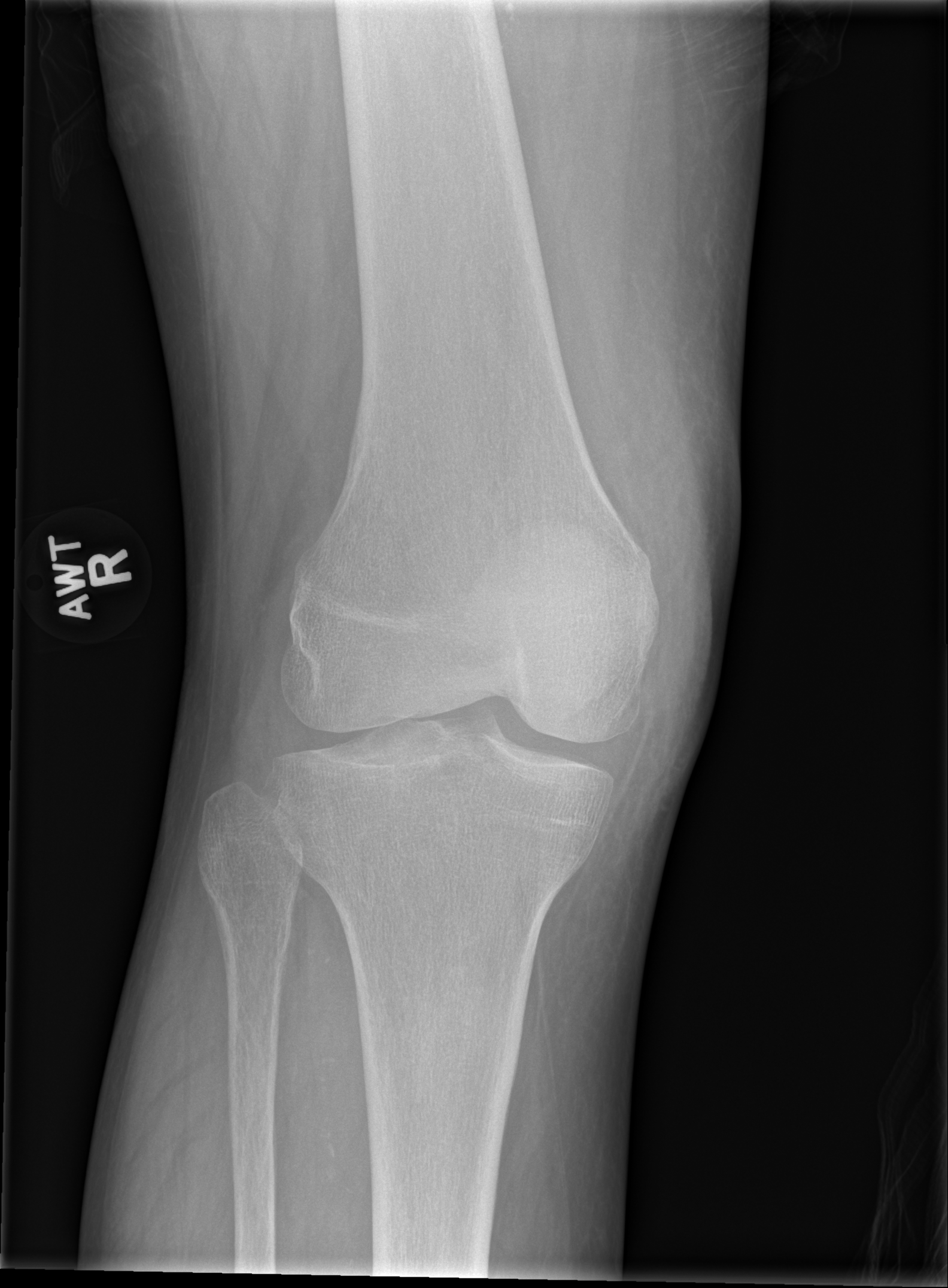

[x knee ap right (3 of 4)]
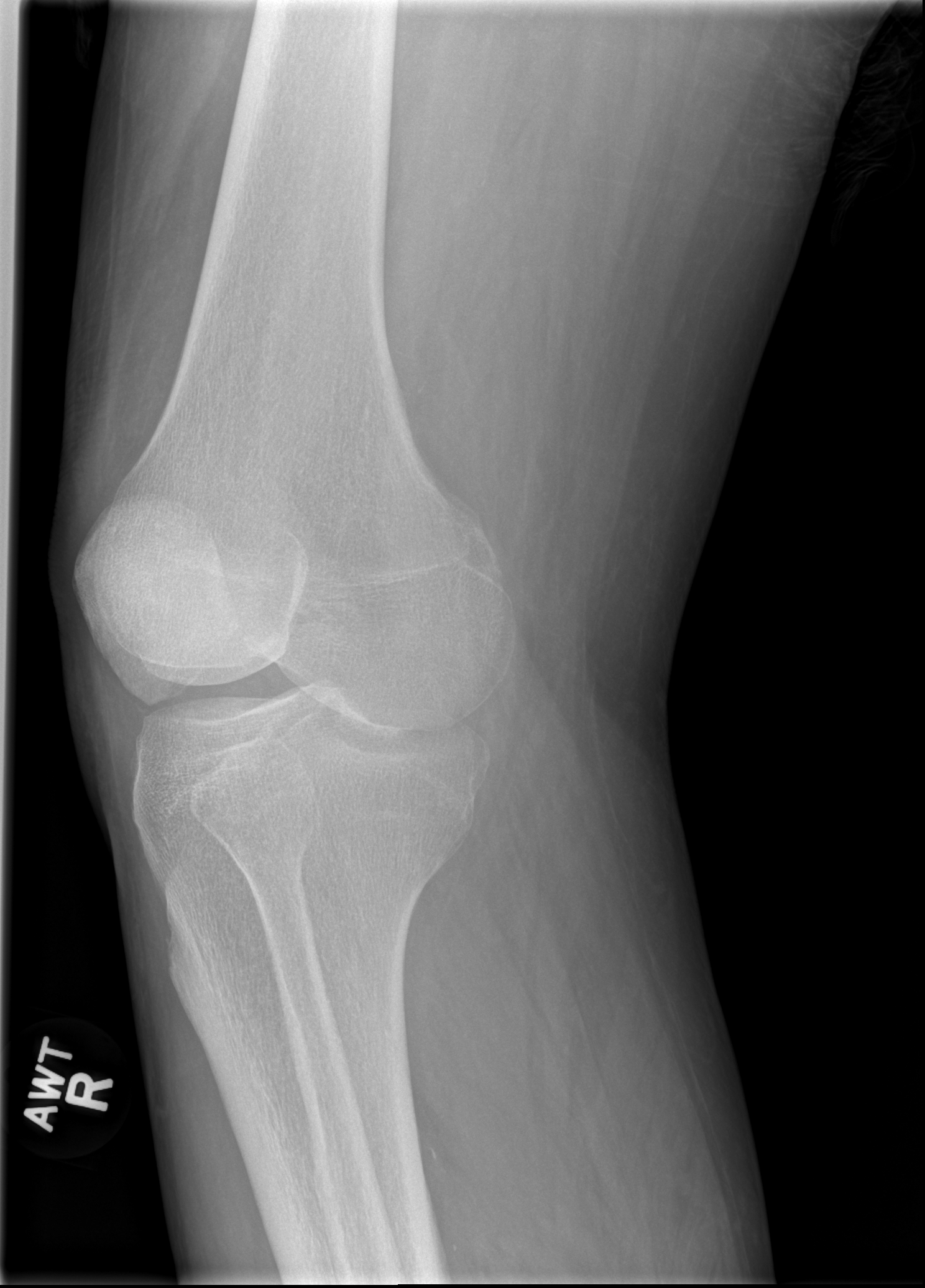

[x knee ap right (4 of 4)]
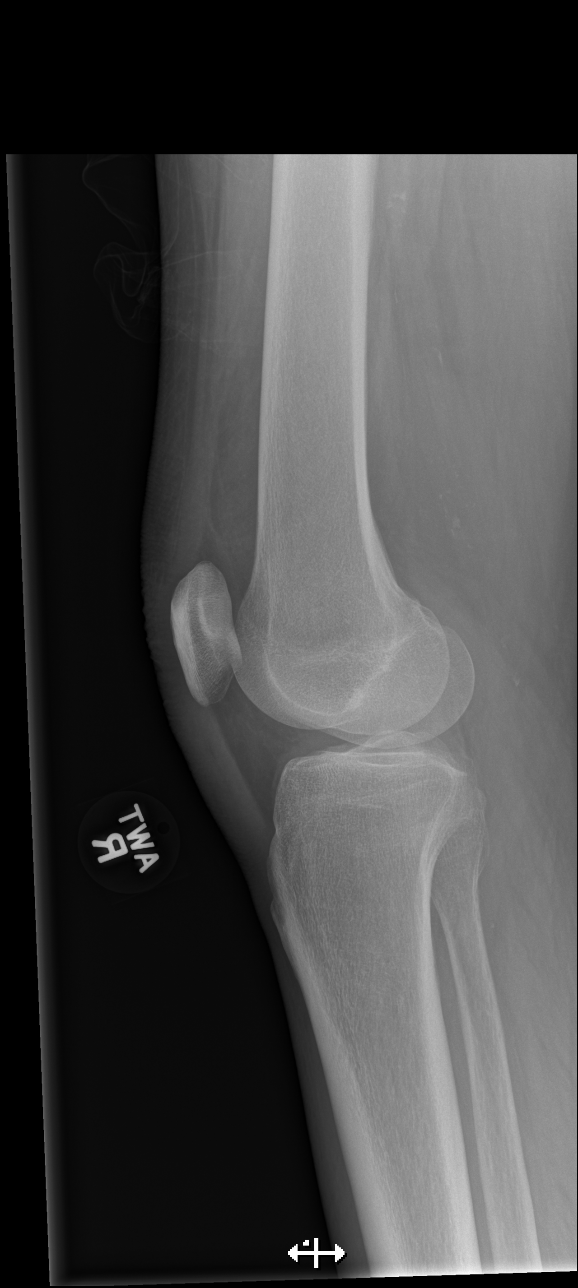

[4 of 4 positions shown; findings below may reference images not displayed]

FINDINGS: The joint spaces are maintained.  No acute fracture or
osteochondral lesion.  No joint effusion.
IMPRESSION: No acute bony findings or joint effusion.

## 2013-07-09 ENCOUNTER — Telehealth: Payer: Self-pay | Admitting: Pulmonary Disease

## 2013-07-09 MED ORDER — BUDESONIDE-FORMOTEROL FUMARATE 160-4.5 MCG/ACT IN AERO: 2.0000 | INHALATION_SPRAY | Freq: Two times a day (BID) | RESPIRATORY_TRACT | Status: DC

## 2013-07-09 NOTE — Telephone Encounter (Signed)
[facility]icort rx sent to [facility].  Pt aware.

## 2013-07-15 ENCOUNTER — Telehealth: Payer: Self-pay | Admitting: *Deleted

## 2013-07-15 NOTE — Telephone Encounter (Signed)
Form received from pharmacy regarding citalopram 10mg  and 20mg . Form in your inbox

## 2013-07-16 ENCOUNTER — Other Ambulatory Visit: Payer: Self-pay | Admitting: Family Medicine

## 2013-07-16 MED ORDER — HYDROCODONE-ACETAMINOPHEN 5-325 MG PO TABS: ORAL_TABLET | ORAL | Status: DC

## 2013-07-16 NOTE — Telephone Encounter (Signed)
Yes its done !

## 2013-07-16 NOTE — Telephone Encounter (Signed)
Last filled 06/06/2013.

## 2013-07-16 NOTE — Telephone Encounter (Signed)
Pt is taking 20mg , 1 tablet daily.

## 2013-07-16 NOTE — Telephone Encounter (Signed)
Noted.  Form signed and in my box.

## 2013-07-16 NOTE — Telephone Encounter (Signed)
Please verify with pt which one he is taking- we have two separate doses on his med list.

## 2013-07-16 NOTE — Telephone Encounter (Signed)
Is this done? Or should I do something else?

## 2013-07-16 NOTE — Telephone Encounter (Signed)
Called to pharmacy 

## 2013-07-18 ENCOUNTER — Ambulatory Visit: Payer: Medicare Other | Admitting: Family Medicine

## 2013-07-22 ENCOUNTER — Telehealth: Payer: Self-pay | Admitting: Pulmonary Disease

## 2013-07-22 MED ORDER — TIOTROPIUM BROMIDE MONOHYDRATE 18 MCG IN CAPS: 18.0000 ug | ORAL_CAPSULE | Freq: Every day | RESPIRATORY_TRACT | Status: DC

## 2013-07-22 NOTE — Telephone Encounter (Signed)
Samples given to Lillia Abed to take to [facility]. Pt wife advised. Carron Curie, CMA

## 2013-07-25 ENCOUNTER — Other Ambulatory Visit: Payer: Self-pay

## 2013-07-25 MED ORDER — ALPRAZOLAM 0.25 MG PO TABS: 0.2500 mg | ORAL_TABLET | Freq: Three times a day (TID) | ORAL | Status: DC | PRN

## 2013-07-25 NOTE — Telephone Encounter (Signed)
Pt request refill alprazolam to  [facility]; pt has one pill left. Pt request cb when refilled.

## 2013-07-25 NOTE — Telephone Encounter (Signed)
Phoned in to pharmacy. 

## 2013-08-01 ENCOUNTER — Other Ambulatory Visit: Payer: Self-pay

## 2013-08-01 ENCOUNTER — Telehealth: Payer: Self-pay | Admitting: Family Medicine

## 2013-08-01 ENCOUNTER — Other Ambulatory Visit: Payer: Self-pay | Admitting: Family Medicine

## 2013-08-01 MED ORDER — ZOLPIDEM TARTRATE 10 MG PO TABS: ORAL_TABLET | ORAL | Status: DC

## 2013-08-01 NOTE — Telephone Encounter (Signed)
Pt has been feeling lethargic,sleepy and wants to review all meds. Pt scheduled 30 min appt 08/06/13 at 1:15 pm to see Dr Ermalene Searing (first appt available pt could come to). Pt will cb if condition changes or worsens prior to 08/06/13.

## 2013-08-01 NOTE — Telephone Encounter (Signed)
We needs at least 1 day notice on rxs. This is unfortunate.  Please call in.

## 2013-08-01 NOTE — Telephone Encounter (Signed)
[name] request refill ambien to [facility]. Pt is out of medication and said he will not be able to sleep tonight if med is not called in. Pt will ck with pharmacy for refill.

## 2013-08-01 NOTE — Telephone Encounter (Signed)
Last office visit 01/31/2013.  Ok to refill?

## 2013-08-02 NOTE — Telephone Encounter (Signed)
Noted  

## 2013-08-02 NOTE — Telephone Encounter (Signed)
Medication phoned to pharmacy.  

## 2013-08-02 NOTE — Telephone Encounter (Signed)
Called to [facility] [address] 

## 2013-08-05 ENCOUNTER — Encounter: Payer: Self-pay | Admitting: Radiology

## 2013-08-06 ENCOUNTER — Ambulatory Visit: Payer: Medicare Other | Admitting: Family Medicine

## 2013-08-19 ENCOUNTER — Encounter (HOSPITAL_COMMUNITY): Payer: Self-pay | Admitting: Emergency Medicine

## 2013-08-19 ENCOUNTER — Emergency Department (HOSPITAL_COMMUNITY): Payer: Medicare Other

## 2013-08-19 ENCOUNTER — Inpatient Hospital Stay (HOSPITAL_COMMUNITY)
Admission: EM | Admit: 2013-08-19 | Discharge: 2013-08-22 | DRG: 378 | Disposition: A | Discharge status: Home Health | Payer: Medicare Other | Attending: Internal Medicine | Admitting: Internal Medicine

## 2013-08-19 DIAGNOSIS — Z8679 Personal history of other diseases of the circulatory system: Secondary | ICD-10-CM

## 2013-08-19 DIAGNOSIS — Z951 Presence of aortocoronary bypass graft: Secondary | ICD-10-CM

## 2013-08-19 DIAGNOSIS — I509 Heart failure, unspecified: Secondary | ICD-10-CM | POA: Diagnosis present

## 2013-08-19 DIAGNOSIS — Z23 Encounter for immunization: Secondary | ICD-10-CM

## 2013-08-19 DIAGNOSIS — K921 Melena: Secondary | ICD-10-CM

## 2013-08-19 DIAGNOSIS — D131 Benign neoplasm of stomach: Secondary | ICD-10-CM | POA: Diagnosis present

## 2013-08-19 DIAGNOSIS — I219 Acute myocardial infarction, unspecified: Secondary | ICD-10-CM

## 2013-08-19 DIAGNOSIS — F3289 Other specified depressive episodes: Secondary | ICD-10-CM | POA: Diagnosis present

## 2013-08-19 DIAGNOSIS — E785 Hyperlipidemia, unspecified: Secondary | ICD-10-CM | POA: Diagnosis present

## 2013-08-19 DIAGNOSIS — Z79899 Other long term (current) drug therapy: Secondary | ICD-10-CM

## 2013-08-19 DIAGNOSIS — D500 Iron deficiency anemia secondary to blood loss (chronic): Secondary | ICD-10-CM | POA: Diagnosis present

## 2013-08-19 DIAGNOSIS — J4489 Other specified chronic obstructive pulmonary disease: Secondary | ICD-10-CM | POA: Diagnosis present

## 2013-08-19 DIAGNOSIS — I2510 Atherosclerotic heart disease of native coronary artery without angina pectoris: Secondary | ICD-10-CM | POA: Diagnosis present

## 2013-08-19 DIAGNOSIS — R55 Syncope and collapse: Secondary | ICD-10-CM | POA: Diagnosis present

## 2013-08-19 DIAGNOSIS — D62 Acute posthemorrhagic anemia: Secondary | ICD-10-CM | POA: Diagnosis present

## 2013-08-19 DIAGNOSIS — I714 Abdominal aortic aneurysm, without rupture, unspecified: Secondary | ICD-10-CM | POA: Diagnosis present

## 2013-08-19 DIAGNOSIS — I252 Old myocardial infarction: Secondary | ICD-10-CM

## 2013-08-19 DIAGNOSIS — D649 Anemia, unspecified: Secondary | ICD-10-CM

## 2013-08-19 DIAGNOSIS — I10 Essential (primary) hypertension: Secondary | ICD-10-CM | POA: Diagnosis present

## 2013-08-19 DIAGNOSIS — K31811 Angiodysplasia of stomach and duodenum with bleeding: Principal | ICD-10-CM | POA: Diagnosis present

## 2013-08-19 DIAGNOSIS — K922 Gastrointestinal hemorrhage, unspecified: Secondary | ICD-10-CM | POA: Diagnosis present

## 2013-08-19 DIAGNOSIS — I4510 Unspecified right bundle-branch block: Secondary | ICD-10-CM | POA: Diagnosis present

## 2013-08-19 DIAGNOSIS — R42 Dizziness and giddiness: Secondary | ICD-10-CM | POA: Diagnosis present

## 2013-08-19 DIAGNOSIS — E119 Type 2 diabetes mellitus without complications: Secondary | ICD-10-CM | POA: Diagnosis present

## 2013-08-19 DIAGNOSIS — F329 Major depressive disorder, single episode, unspecified: Secondary | ICD-10-CM | POA: Diagnosis present

## 2013-08-19 DIAGNOSIS — J449 Chronic obstructive pulmonary disease, unspecified: Secondary | ICD-10-CM

## 2013-08-19 DIAGNOSIS — I5032 Chronic diastolic (congestive) heart failure: Secondary | ICD-10-CM | POA: Diagnosis present

## 2013-08-19 DIAGNOSIS — Z87891 Personal history of nicotine dependence: Secondary | ICD-10-CM

## 2013-08-19 DIAGNOSIS — J309 Allergic rhinitis, unspecified: Secondary | ICD-10-CM

## 2013-08-19 DIAGNOSIS — I5030 Unspecified diastolic (congestive) heart failure: Secondary | ICD-10-CM

## 2013-08-19 DIAGNOSIS — K31819 Angiodysplasia of stomach and duodenum without bleeding: Secondary | ICD-10-CM | POA: Diagnosis present

## 2013-08-19 DIAGNOSIS — Z880 Allergy status to penicillin: Secondary | ICD-10-CM

## 2013-08-19 LAB — COMPREHENSIVE METABOLIC PANEL
ALT: 9 U/L (ref 0–53)
AST: 13 U/L (ref 0–37)
Alkaline Phosphatase: 129 U/L — ABNORMAL HIGH (ref 39–117)
CO2: 27 mEq/L (ref 19–32)
Calcium: 8.9 mg/dL (ref 8.4–10.5)
GFR calc Af Amer: >90 mL/min (ref >90)
Glucose, Bld: 274 mg/dL — ABNORMAL HIGH (ref 70–99)
Potassium: 3.9 mEq/L (ref 3.5–5.1)
Sodium: 135 mEq/L (ref 135–145)
Total Protein: 6.7 g/dL (ref 6.0–8.3)

## 2013-08-19 LAB — PROTIME-INR: Prothrombin Time: 13.3 seconds (ref 11.6–15.2)

## 2013-08-19 LAB — PREPARE RBC (CROSSMATCH)

## 2013-08-19 LAB — GLUCOSE, CAPILLARY: Glucose-Capillary: 192 mg/dL — ABNORMAL HIGH (ref 70–99)

## 2013-08-19 LAB — IRON AND TIBC
Saturation Ratios: 4 % — ABNORMAL LOW (ref 20–55)
TIBC: 431 ug/dL (ref 215–435)
UIBC: 414 ug/dL — ABNORMAL HIGH (ref 125–400)

## 2013-08-19 LAB — CBC
MCH: 23.4 pg — ABNORMAL LOW (ref 26.0–34.0)
MCHC: 28.6 g/dL — ABNORMAL LOW (ref 30.0–36.0)
MCV: 81.6 fL (ref 78.0–100.0)
Platelets: 301 10*3/uL (ref 150–400)
RBC: 3.04 MIL/uL — ABNORMAL LOW (ref 4.22–5.81)

## 2013-08-19 LAB — TROPONIN I: Troponin I: <0.3 ng/mL (ref <0.30)

## 2013-08-19 LAB — APTT: aPTT: 27 seconds (ref 24–37)

## 2013-08-19 LAB — FERRITIN: Ferritin: 5 ng/mL — ABNORMAL LOW (ref 22–322)

## 2013-08-19 LAB — PRO B NATRIURETIC PEPTIDE: Pro B Natriuretic peptide (BNP): 596.9 pg/mL — ABNORMAL HIGH (ref 0–125)

## 2013-08-19 MED ORDER — ALPRAZOLAM 0.25 MG PO TABS
0.2500 mg | ORAL_TABLET | Freq: Three times a day (TID) | ORAL | Status: DC | PRN
  Administered 2013-08-19 – 2013-08-22 (×4): 0.25 mg via ORAL
  Filled 2013-08-19 (×4): qty 1

## 2013-08-19 MED ORDER — ONDANSETRON HCL 4 MG PO TABS: 4.0000 mg | ORAL_TABLET | Freq: Four times a day (QID) | ORAL | Status: DC | PRN

## 2013-08-19 MED ORDER — TIOTROPIUM BROMIDE MONOHYDRATE 18 MCG IN CAPS
18.0000 ug | ORAL_CAPSULE | Freq: Every day | RESPIRATORY_TRACT | Status: DC
  Administered 2013-08-20 – 2013-08-22 (×3): 18 ug via RESPIRATORY_TRACT
  Filled 2013-08-19: qty 5

## 2013-08-19 MED ORDER — METOPROLOL TARTRATE 25 MG PO TABS
25.0000 mg | ORAL_TABLET | Freq: Two times a day (BID) | ORAL | Status: DC
  Administered 2013-08-19 – 2013-08-22 (×6): 25 mg via ORAL
  Filled 2013-08-19 (×7): qty 1

## 2013-08-19 MED ORDER — ATORVASTATIN CALCIUM 40 MG PO TABS
40.0000 mg | ORAL_TABLET | Freq: Every day | ORAL | Status: DC
  Administered 2013-08-20 – 2013-08-22 (×3): 40 mg via ORAL
  Filled 2013-08-19 (×4): qty 1

## 2013-08-19 MED ORDER — BUDESONIDE-FORMOTEROL FUMARATE 160-4.5 MCG/ACT IN AERO
2.0000 | INHALATION_SPRAY | Freq: Two times a day (BID) | RESPIRATORY_TRACT | Status: DC
  Administered 2013-08-19 – 2013-08-22 (×4): 2 via RESPIRATORY_TRACT
  Filled 2013-08-19: qty 6

## 2013-08-19 MED ORDER — PANTOPRAZOLE SODIUM 40 MG PO TBEC
40.0000 mg | DELAYED_RELEASE_TABLET | Freq: Two times a day (BID) | ORAL | Status: DC
  Administered 2013-08-19 – 2013-08-22 (×6): 40 mg via ORAL
  Filled 2013-08-19 (×7): qty 1

## 2013-08-19 MED ORDER — SODIUM CHLORIDE 0.9 % IJ SOLN
3.0000 mL | Freq: Two times a day (BID) | INTRAMUSCULAR | Status: DC
Start: 2013-08-19 — End: 2013-08-22
  Administered 2013-08-20 – 2013-08-22 (×5): 3 mL via INTRAVENOUS

## 2013-08-19 MED ORDER — ONDANSETRON HCL 4 MG/2ML IJ SOLN: 4.0000 mg | Freq: Four times a day (QID) | INTRAMUSCULAR | Status: DC | PRN

## 2013-08-19 MED ORDER — ZOLPIDEM TARTRATE 5 MG PO TABS
5.0000 mg | ORAL_TABLET | Freq: Every day | ORAL | Status: DC
  Administered 2013-08-19 – 2013-08-20 (×2): 5 mg via ORAL
  Filled 2013-08-19 (×2): qty 1

## 2013-08-19 MED ORDER — ACETAMINOPHEN 325 MG PO TABS
650.0000 mg | ORAL_TABLET | Freq: Four times a day (QID) | ORAL | Status: DC | PRN
  Administered 2013-08-21: 650 mg via ORAL
  Filled 2013-08-19: qty 2

## 2013-08-19 MED ORDER — SODIUM CHLORIDE 0.9 % IV SOLN
INTRAVENOUS | Status: DC
  Administered 2013-08-19 – 2013-08-20 (×2): via INTRAVENOUS

## 2013-08-19 MED ORDER — INSULIN ASPART 100 UNIT/ML ~~LOC~~ SOLN
0.0000 [IU] | Freq: Three times a day (TID) | SUBCUTANEOUS | Status: DC
  Administered 2013-08-20 – 2013-08-21 (×4): 3 [IU] via SUBCUTANEOUS
  Administered 2013-08-21: 2 [IU] via SUBCUTANEOUS

## 2013-08-19 MED ORDER — ACETAMINOPHEN 650 MG RE SUPP: 650.0000 mg | Freq: Four times a day (QID) | RECTAL | Status: DC | PRN

## 2013-08-19 MED ORDER — ALBUTEROL SULFATE HFA 108 (90 BASE) MCG/ACT IN AERS
2.0000 | INHALATION_SPRAY | Freq: Four times a day (QID) | RESPIRATORY_TRACT | Status: DC | PRN
  Administered 2013-08-21: 02:00:00 2 via RESPIRATORY_TRACT
  Filled 2013-08-19: qty 6.7

## 2013-08-19 MED ORDER — HYDROCODONE-ACETAMINOPHEN 5-325 MG PO TABS
1.0000 | ORAL_TABLET | Freq: Four times a day (QID) | ORAL | Status: DC | PRN
  Administered 2013-08-19 – 2013-08-22 (×6): 1 via ORAL
  Filled 2013-08-19 (×6): qty 1

## 2013-08-19 MED ORDER — INFLUENZA VAC SPLIT QUAD 0.5 ML IM SUSP
0.5000 mL | INTRAMUSCULAR | Status: AC
  Administered 2013-08-20: 0.5 mL via INTRAMUSCULAR
  Filled 2013-08-19 (×2): qty 0.5

## 2013-08-19 MED ORDER — CITALOPRAM HYDROBROMIDE 20 MG PO TABS
20.0000 mg | ORAL_TABLET | Freq: Every day | ORAL | Status: DC
  Administered 2013-08-20 – 2013-08-21 (×2): 20 mg via ORAL
  Filled 2013-08-19 (×3): qty 1

## 2013-08-19 MED ORDER — FOLIC ACID 1 MG PO TABS
1.0000 mg | ORAL_TABLET | Freq: Two times a day (BID) | ORAL | Status: DC
  Administered 2013-08-19 – 2013-08-22 (×6): 1 mg via ORAL
  Filled 2013-08-19 (×7): qty 1

## 2013-08-19 MED ORDER — ZOLPIDEM TARTRATE 10 MG PO TABS: 10.0000 mg | ORAL_TABLET | Freq: Every day | ORAL | Status: DC

## 2013-08-19 MED ORDER — POLYSACCHARIDE IRON COMPLEX 150 MG PO CAPS
150.0000 mg | ORAL_CAPSULE | Freq: Two times a day (BID) | ORAL | Status: DC
  Administered 2013-08-19 – 2013-08-22 (×6): 150 mg via ORAL
  Filled 2013-08-19 (×7): qty 1
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
## 2013-08-19 NOTE — ED Notes (Signed)
Pt c/o SOB on exertion x3days, states on home o2 but they decreased it to 2l/min/[place], pt c/o black stools x3 today
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
## 2013-08-20 DIAGNOSIS — I219 Acute myocardial infarction, unspecified: Secondary | ICD-10-CM

## 2013-08-20 DIAGNOSIS — I714 Abdominal aortic aneurysm, without rupture: Secondary | ICD-10-CM

## 2013-08-20 DIAGNOSIS — J309 Allergic rhinitis, unspecified: Secondary | ICD-10-CM

## 2013-08-20 LAB — GLUCOSE, CAPILLARY
Glucose-Capillary: 219 mg/dL — ABNORMAL HIGH (ref 70–99)
Glucose-Capillary: 202 mg/dL — ABNORMAL HIGH (ref 70–99)
Glucose-Capillary: 215 mg/dL — ABNORMAL HIGH (ref 70–99)
Glucose-Capillary: 262 mg/dL — ABNORMAL HIGH (ref 70–99)

## 2013-08-20 LAB — OCCULT BLOOD X 1 CARD TO LAB, STOOL: Fecal Occult Bld: NEGATIVE

## 2013-08-20 LAB — HEMOGLOBIN A1C
Hgb A1c MFr Bld: 6.7 % — ABNORMAL HIGH (ref <5.7)
Mean Plasma Glucose: 146 mg/dL — ABNORMAL HIGH (ref <117)

## 2013-08-20 LAB — CBC
Hemoglobin: 9.1 g/dL — ABNORMAL LOW (ref 13.0–17.0)
MCHC: 30.2 g/dL (ref 30.0–36.0)
MCV: 81.8 fL (ref 78.0–100.0)
Platelets: 253 10*3/uL (ref 150–400)
RBC: 3.68 MIL/uL — ABNORMAL LOW (ref 4.22–5.81)
WBC: 7.1 10*3/uL (ref 4.0–10.5)

## 2013-08-20 LAB — BASIC METABOLIC PANEL
CO2: 26 mEq/L (ref 19–32)
Calcium: 8.2 mg/dL — ABNORMAL LOW (ref 8.4–10.5)
Chloride: 102 mEq/L (ref 96–112)
GFR calc Af Amer: >90 mL/min (ref >90)
Glucose, Bld: 209 mg/dL — ABNORMAL HIGH (ref 70–99)
Sodium: 137 mEq/L (ref 135–145)

## 2013-08-20 MED ORDER — FAMOTIDINE 20 MG PO TABS
20.0000 mg | ORAL_TABLET | Freq: Two times a day (BID) | ORAL | Status: DC | PRN
  Filled 2013-08-20: qty 1

## 2013-08-20 NOTE — Progress Notes (Signed)
Patient requested PRN for acid reflux.  Explained that I would check his medication orders to see what was available.  Went back into the patient's room, patient was asleep.  Patient called to use the bathroom about an hour later and again requested PRN for acid reflux.  Paged NP on call, received order for Pepcid.  Took Pepcid into room, at which point patient said "Get out of here, I don't want you in here".  Explained to patient that the NP had ordered medication, but patient was adamant about RN leaving the room.  Will continue to monitor.

## 2013-08-20 NOTE — Care Management Note (Addendum)
    Page 1 of 2   08/22/2013     11:43:11 AM   CARE MANAGEMENT NOTE 08/22/2013  Patient:  BRIT, WERNETTE   Account Number:  [id number]  Date Initiated:  08/20/2013  Documentation initiated by:  Lanier Clam  Subjective/Objective Assessment:   72 Y/O M ADMITTED W/BLOOD LOSS ANEMIA.HX:GIB,DUODENAL AVM.     Action/Plan:   FROM HOME W/SPOUSE.HAS PCP,PHARMACY.HAS CANE,RW,3N1,HOME 02-[facility].   Anticipated DC Date:  08/22/2013   Anticipated DC Plan:  HOME W HOME HEALTH SERVICES      DC Planning Services  CM consult      Choice offered to / List presented to:  C-1 Patient        HH arranged  HH-2 PT  HH-4 NURSE'S AIDE      HH agency  [facility]   Status of service:  Completed, signed off Medicare Important Message given?   (If response is "NO", the following Medicare IM given date fields will be blank) Date Medicare IM given:   Date Additional Medicare IM given:    Discharge Disposition:  HOME W HOME HEALTH SERVICES  Per UR Regulation:  Reviewed for med. necessity/level of care/duration of stay  If discussed at Long Length of Stay Meetings, dates discussed:    Comments:  08/22/13 Anahli Arvanitis RN,BSN NCM [phone] [facility] HHPT/AIDE ORDERED,KRISTEN REP AWARE OF D/C & HHC ORDERS.ALREADY HAS HOME 02-[facility].  08/21/13 Linkin Vizzini RN,BSN NCM [phone] NOTED PT ATTEMPTED TO SEE PATIENT BUT PATIENT DECLINED PT @ THAT TIME. NOW PATIENT ABLE TO SEE PT,TEXT PAGED PT TO RETURN [phone].AWAIT PT RECOMMENDATIONS.WOULD RECOMMEND HHRN.[facility] AWARE & FOLLOWING.  08/20/13 Lorene Samaan RN,BSN NCM [phone] PT CONS,AWAIT RECOMMENDATIONS.USED [facility] IN PAST WILL USE THEM AGAIN IF NEEDED.[facility] MADE AWARE & FOLLOWING.

## 2013-08-20 NOTE — Progress Notes (Signed)
Inpatient Diabetes Program Recommendations  AACE/ADA: New Consensus Statement on Inpatient Glycemic Control (2013)  Target Ranges:  Prepandial:   less than 140 mg/dL      Peak postprandial:   less than 180 mg/dL (1-2 hours)      Critically ill patients:  140 - 180 mg/dL     Results for DYLLAN, HUGHETT (MRN [id number]) as of 08/20/2013 10:05  Ref. Range 08/20/2013 08:45  Glucose-Capillary Latest Range: 70-99 mg/dL 409 (H)     **Patient admitted with CP/SOB/Low hemoglobin level.  Received 2 units PRBCs yesterday.  **Noted Novolog Sensitive SSI started today.   **MD- Please change diet to Carbohydrate Modified diet so patient will have monitored carbohydrates (current diet order for Regular diet)     Will follow. Ambrose Finland RN, MSN, CDE Diabetes Coordinator Inpatient Diabetes Program Team Pager: [phone] (8a-10p)

## 2013-08-20 NOTE — Progress Notes (Addendum)
[facility] PROGRESS NOTE  [name] MRN:[id number] DOB: 01/26/1941 DOA: 08/19/2013 PCP: Talia Aron, MD  Brief narrative: 72-year-old male with a history of COPD, coronary artery disease status post CABG, GI bleed, duodenal AVM, and diabetes mellulitis presented with one-week history of worsening dyspnea on exertion associated with chest discomfort, fatigue, and one episode of syncope that occurred 2 days prior to this admission while pt was trying to get up from comode. Please note that pt has had multiple endoscopies in the past including EGD on 12/06/2012 which showed gastric polyps and the source of bleed determined to be duodenal AVM as well as gastric polyp.  In the ED, the patient was found to have a hemoglobin of 7.1. He was hemodynamically stable. 2 units PRBCs have been ordered. [facility] asked to admit for further evaluation.   Assessment/Plan:   Principal Problem: Acute blood loss anemia in patient with history of duodenal AVM - likely source duodenal AVM; GI consulted and appreciate their recommendations  - Baseline hemoglobin 8-9 and on this admission 7.1, FOBT initially positive, second test negative  - transfused 2 units PRBC 11/03, post transfusion Hg pending this AM - continue oral iron supplementation  Active Problems: History of duodenal AVM  - follow up with GI recommendations - aspirin on hold for now due to blood loss anemia Syncope/dizziness  - Likely due to the patient's acute on chronic blood loss anemia  - cardiac enzymes are negative so far Chronic diastolic CHF  - Appears euvolemic on exam, no signs of volume overload  - based on last 2 D CHO in 11/2012 with essentially normal EF and grade 1 diastolic dysfunction - will d/c IVF as pt is getting blood products  - Continue metoprolol tartrate  - monitor daily weights and I's and O's COPD  -Continue long-acting beta agonist, Spiriva, albuterol when necessary shortness of breath  - monitor vitals per  floor protocol  Coronary artery disease with history of CABG  - EKG remains unchanged with right bundle branch block compared to previous EKG's - aspirin on hold due to blood loss anemia Diabetes mellitus type 2  - A1c pending - place on SSI for now   Studies: Dg Chest 2 View  08/19/2013   Stable emphysematous and pulmonary scarring changes. No acute pulmonary findings.    Code Status: full code Family Communication: no family at the bedside Disposition Plan: home when stable; follow up PT evaluation  Amous Crewe, MD  [facility] Pager [phone]  If 7PM-7AM, please contact night-coverage [email or web address] Password [email or web address]  08/20/2013, 6:42 AM   LOS: 1 day   Consultants:  GI  Procedures:  None  Antibiotics:  None  HPI/Subjective: No events overnight. Pt denies shortness of breath this am and no chest pain.  Objective: Filed Vitals:   08/20/13 0420 08/20/13 0520 08/20/13 0540 08/20/13 0611  BP: 130/75 138/83 129/83 132/71  Pulse: 74 74 72 77  Temp: 97.5 F (36.4 C) 97.6 F (36.4 C) 97.6 F (36.4 C) 97.2 F (36.2 C)  TempSrc: Oral Oral Oral Oral  Resp: 20 20 20 18  Height:      Weight:      SpO2: 99% 99% 100% 100%    Intake/Output Summary (Last 24 hours) at 08/20/13 0642 Last data filed at 08/20/13 0540  Gross per 24 hour  Intake    650 ml  Output   1375 ml  Net   -725 ml    Exam:   General:  Pt is alert,   follows commands appropriately, not in acute distress  Cardiovascular: Regular rate and rhythm, S1/S2, no murmurs, no rubs, no gallops  Respiratory: Clear to auscultation bilaterally, no wheezing, no crackles, no rhonchi  Abdomen: Soft, non tender, non distended, bowel sounds present, no guarding  Extremities: No edema, pulses DP and PT palpable bilaterally  Neuro: Grossly nonfocal  Data Reviewed: Basic Metabolic Panel:  Recent Labs Lab 08/19/13 1518  NA 135  K 3.9  CL 96  CO2 27  GLUCOSE 274*  BUN 11  CREATININE 0.94  CALCIUM  8.9   Liver Function Tests:  Recent Labs Lab 08/19/13 1518  AST 13  ALT 9  ALKPHOS 129*  BILITOT 0.2*  PROT 6.7  ALBUMIN 3.2*   CBC:  Recent Labs Lab 08/19/13 1518  WBC 7.0  HGB 7.1*  HCT 24.8*  MCV 81.6  PLT 301   Cardiac Enzymes:  Recent Labs Lab 08/19/13 1531 08/19/13 2110  TROPONINI <0.30 <0.30   CBG:  Recent Labs Lab 08/19/13 2113  GLUCAP 192*   Scheduled Meds: . atorvastatin  40 mg Oral Daily  . budesonide-formoterol  2 puff Inhalation BID  . citalopram  20 mg Oral Daily  . folic acid  1 mg Oral BID  . insulin aspart  0-9 Units Subcutaneous TID WC  . iron polysaccharides  150 mg Oral BID  . metoprolol tartrate  25 mg Oral BID  . pantoprazole  40 mg Oral BID  . tiotropium  18 mcg Inhalation Daily  . zolpidem  5 mg Oral QHS   Continuous Infusions: . sodium chloride 125 mL/hr at 08/20/13 0550         

## 2013-08-20 NOTE — Progress Notes (Signed)
PT Cancellation Note  Patient Details Name: [name] MRN: [id number] DOB: [birth year]   Cancelled Treatment:    Reason Eval/Treat Not Completed: Fatigue/lethargy limiting ability to participate. Pt refused PT, he stated he won't get up until he has some food. "I'm hungry and argumentative. I can't walk until I get some fuel for my engine."  Will follow.    Ralene Bathe Kistler 08/20/2013, 9:52 AM [phone]
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
## 2013-08-21 ENCOUNTER — Encounter (HOSPITAL_COMMUNITY)
Admission: EM | Disposition: A | Discharge status: Home Health | Payer: Self-pay | Source: Home / Self Care | Attending: Internal Medicine

## 2013-08-21 ENCOUNTER — Encounter (HOSPITAL_COMMUNITY): Payer: Self-pay

## 2013-08-21 ENCOUNTER — Telehealth: Payer: Self-pay | Admitting: Cardiovascular Disease

## 2013-08-21 DIAGNOSIS — K921 Melena: Secondary | ICD-10-CM

## 2013-08-21 DIAGNOSIS — I10 Essential (primary) hypertension: Secondary | ICD-10-CM

## 2013-08-21 DIAGNOSIS — K31819 Angiodysplasia of stomach and duodenum without bleeding: Secondary | ICD-10-CM | POA: Diagnosis present

## 2013-08-21 DIAGNOSIS — I5030 Unspecified diastolic (congestive) heart failure: Secondary | ICD-10-CM

## 2013-08-21 DIAGNOSIS — D649 Anemia, unspecified: Secondary | ICD-10-CM

## 2013-08-21 DIAGNOSIS — E119 Type 2 diabetes mellitus without complications: Secondary | ICD-10-CM

## 2013-08-21 HISTORY — PX: ESOPHAGOGASTRODUODENOSCOPY: SHX5428

## 2013-08-21 LAB — BASIC METABOLIC PANEL
BUN: 8 mg/dL (ref 6–23)
CO2: 28 mEq/L (ref 19–32)
Chloride: 103 mEq/L (ref 96–112)
Creatinine, Ser: 0.86 mg/dL (ref 0.50–1.35)
GFR calc Af Amer: >90 mL/min (ref >90)
Potassium: 3.9 mEq/L (ref 3.5–5.1)

## 2013-08-21 LAB — CBC
HCT: 28.7 % — ABNORMAL LOW (ref 39.0–52.0)
Hemoglobin: 8.7 g/dL — ABNORMAL LOW (ref 13.0–17.0)
MCV: 81.3 fL (ref 78.0–100.0)
Platelets: 245 10*3/uL (ref 150–400)
RBC: 3.53 MIL/uL — ABNORMAL LOW (ref 4.22–5.81)
RDW: 18.5 % — ABNORMAL HIGH (ref 11.5–15.5)
WBC: 7.5 10*3/uL (ref 4.0–10.5)

## 2013-08-21 LAB — TYPE AND SCREEN
ABO/RH(D): B NEG
Antibody Screen: NEGATIVE
Unit division: 0
Unit division: 0

## 2013-08-21 LAB — GLUCOSE, CAPILLARY
Glucose-Capillary: 183 mg/dL — ABNORMAL HIGH (ref 70–99)
Glucose-Capillary: 207 mg/dL — ABNORMAL HIGH (ref 70–99)

## 2013-08-21 SURGERY — EGD (ESOPHAGOGASTRODUODENOSCOPY)
Anesthesia: Moderate Sedation

## 2013-08-21 MED ORDER — FENTANYL CITRATE 0.05 MG/ML IJ SOLN
INTRAMUSCULAR | Status: AC
  Filled 2013-08-21: qty 2

## 2013-08-21 MED ORDER — ALBUTEROL SULFATE (5 MG/ML) 0.5% IN NEBU
2.5000 mg | INHALATION_SOLUTION | Freq: Four times a day (QID) | RESPIRATORY_TRACT | Status: DC
  Administered 2013-08-21 – 2013-08-22 (×2): 2.5 mg via RESPIRATORY_TRACT
  Filled 2013-08-21 (×3): qty 0.5

## 2013-08-21 MED ORDER — ALBUTEROL SULFATE HFA 108 (90 BASE) MCG/ACT IN AERS
2.0000 | INHALATION_SPRAY | RESPIRATORY_TRACT | Status: DC | PRN
  Administered 2013-08-21: 06:00:00 2 via RESPIRATORY_TRACT
  Filled 2013-08-21: qty 6.7

## 2013-08-21 MED ORDER — FENTANYL CITRATE 0.05 MG/ML IJ SOLN
INTRAMUSCULAR | Status: DC | PRN
  Administered 2013-08-21 (×2): 25 ug via INTRAVENOUS

## 2013-08-21 MED ORDER — MIDAZOLAM HCL 10 MG/2ML IJ SOLN
INTRAMUSCULAR | Status: DC | PRN
  Administered 2013-08-21: 2 mg via INTRAVENOUS
  Administered 2013-08-21: 1 mg via INTRAVENOUS
  Administered 2013-08-21: 2 mg via INTRAVENOUS

## 2013-08-21 MED ORDER — MIDAZOLAM HCL 10 MG/2ML IJ SOLN
INTRAMUSCULAR | Status: AC
  Filled 2013-08-21: qty 2

## 2013-08-21 MED ORDER — BUTAMBEN-TETRACAINE-BENZOCAINE 2-2-14 % EX AERO
INHALATION_SPRAY | CUTANEOUS | Status: DC | PRN
  Administered 2013-08-21: 2 via TOPICAL

## 2013-08-21 MED ORDER — INSULIN ASPART 100 UNIT/ML ~~LOC~~ SOLN
0.0000 [IU] | SUBCUTANEOUS | Status: DC
  Administered 2013-08-21: 3 [IU] via SUBCUTANEOUS
  Administered 2013-08-21 – 2013-08-22 (×2): 5 [IU] via SUBCUTANEOUS
  Administered 2013-08-22: 06:00:00 3 [IU] via SUBCUTANEOUS
  Administered 2013-08-22: 5 [IU] via SUBCUTANEOUS
  Administered 2013-08-22: 3 [IU] via SUBCUTANEOUS

## 2013-08-21 MED ORDER — SODIUM CHLORIDE 0.9 % IV SOLN: INTRAVENOUS | Status: DC

## 2013-08-21 NOTE — Telephone Encounter (Signed)
Returned call.  Left message asking that hardy copy request be faxed to [phone] for refills.  

## 2013-08-21 NOTE — Telephone Encounter (Signed)
Need a refill on Protonix 40mg  #60 please.

## 2013-08-21 NOTE — Interval H&P Note (Signed)
History and Physical Interval Note:  08/21/2013 7:45 AM  [name]  has presented today for surgery, with the diagnosis of anemia  hx of avm  The various methods of treatment have been discussed with the patient and family. After consideration of risks, benefits and other options for treatment, the patient has consented to  Procedure(s): ESOPHAGOGASTRODUODENOSCOPY (EGD) (N/A) as a surgical intervention .  The patient's history has been reviewed, patient examined, no change in status, stable for surgery.  I have reviewed the patient's chart and labs.  Questions were answered to the patient's satisfaction.     [name]

## 2013-08-21 NOTE — Progress Notes (Signed)
PT Cancellation Note  Patient Details Name: [name] MRN: [id number] DOB: 1941-07-02   Cancelled Treatment:    Reason Eval/Treat Not Completed: Other (comment) (pt declined PT today.) Pt declined to participate in PT today as he was in pain and fatigued after EGD procedure this morning. PT will check back with pt tomorrow, as schedule permits.   Sol Blazing 08/21/2013, 1:12 PM

## 2013-08-21 NOTE — Op Note (Signed)
[facility] [address] [place], [address]   OPERATIVE PROCEDURE REPORT  PATIENT: [name], [name]  MR#: [id number] BIRTHDATE: January 20, 1941  GENDER: Male ENDOSCOPIST: Jeani Hawking, MD ASSISTANT:   Kandice Robinsons, technician and Anthony Sar, RN PROCEDURE DATE: 08/21/2013 PROCEDURE:   EGD with control of bleeding ASA CLASS:   Class IV INDICATIONS: Anemia, Heme positive stool, and Melena MEDICATIONS: Versed 5 mg IV and Fentanyl 50 mcg IV TOPICAL ANESTHETIC:   Cetacaine Spray  DESCRIPTION OF PROCEDURE:   After the risks benefits and alternatives of the procedure were thoroughly explained, informed consent was obtained.  The PENTAX GASTOROSCOPE [id number]  endoscope was introduced through the mouth  and advanced to the second portion of the duodenum Without limitations.      The instrument was slowly withdrawn as the mucosa was fully examined.      FINDINGS: The esophagus was normal.  In the proximal gastric body there was evidence of the polypoid lesions previously identified. ? Inflammatory polyps.  One of the polyps displayed mild oozing, but with with prolonged observation other sites of bleeding were noted.  There was bleeding from the polypoid lesions as well as AVMs.  All bleeding sites were treated wtih APC, however, two of the sites continued to bleed.  Two hemoclips were deployed across the base of these sites.  No duodenal AVMs noted during this procedure.          The scope was then withdrawn from the patient and the procedure terminated.  COMPLICATIONS: There were no complications. IMPRESSION: 1) Gastric AVMs with bleeding. 2) Proximal polypoid lesions with bleeding.  RECOMMENDATIONS: 1) PPI QD. 2) Repeat EGD in 2-3 weeks for retreatment of these polypoid lesions. 3) Follow HGB.  _______________________________ eSignedJeani Hawking, MD 08/21/2013 8:11 AM

## 2013-08-21 NOTE — Progress Notes (Signed)
[facility] PROGRESS NOTE  [name] ZOX:[id number] DOB: 10-Sep-1941 DOA: 08/19/2013 PCP: [name], MD  Assessment/Plan: Acute blood loss anemia in patient with history of duodenal AVM  - EGD showed source of bleeding to be polyps/AVM in the proximal gastric body. See below for full report - Baseline hemoglobin 8-9 and on this admission 7.1, FOBT initially positive, second test negative  - transfused 2 units PRBC 11/03, post transfusion Hg pending this AM  - continue oral iron supplementation -Discharge patient in a.m. if H./H. stable  History of duodenal AVM  - follow up with GI recommendations  - aspirin on hold for now due to blood loss anemia   Syncope/dizziness  - Likely due to the patient's acute on chronic blood loss anemia  - cardiac enzymes are negative so far  -Obtain a.m. orthostatic vitals. If stable discharge  Chronic diastolic CHF  - Appears euvolemic on exam, no signs of volume overload  - based on last 2 D CHO in 11/2012 with essentially normal EF and grade 1 diastolic dysfunction  - will d/c IVF as pt is getting blood products  - Restarted Metoprolol 25 mg  BID; BP outside of AHA guidelines may need to increase prior to discharge  - monitor daily weights and I's and O's   COPD  -Continue long-acting beta agonist, Spiriva, albuterol when necessary shortness of breath  - monitor vitals per floor protocol   Coronary artery disease with history of CABG  - EKG remains unchanged with right bundle branch block compared to previous EKG's  - aspirin on hold due to blood loss anemia   Diabetes mellitus type 2  - Hemoglobin A1c 08/20/2013= 6.7 - place on SSI moderate   Code Status: Full Family Communication:   Disposition Plan: Discharge in a.m. if stable   Consultants:  Gastroenterology (Dr. Jeani Hawking)  Procedures:  EGD 08/21/2013 -proximal gastric body there was evidence of the polypoid lesions previously identified.  ? Inflammatory  polyps. One of the polyps displayed mild oozing, but with with prolonged observation other sites of bleeding were noted.  -Bleeding from the polypoid lesions as well as AVMs. All bleeding sites were treated wtih APC, however, two of the sites continued to bleed. Two hemoclips were deployed across the base of these sites.  -No duodenal AVMs noted.    Antibiotics:    HPI/Subjective: 72 year old male with a history of COPD, coronary artery disease status post CABG, GI bleed, duodenal AVM, and diabetes mellulitis presented with one-week history of worsening dyspnea on exertion associated with chest discomfort, fatigue, and one episode of syncope that occurred 2 days prior to this admission while pt was trying to get up from comode. Please note that pt has had multiple endoscopies in the past including EGD on 12/06/2012 which showed gastric polyps and the source of bleed determined to be duodenal AVM as well as gastric polyp.  In the ED, the patient was found to have a hemoglobin of 7.1. He was hemodynamically stable. 2 units PRBCs have been ordered. TR[facility] asked to admit for further evaluation. TODAY states feeling greatly improved, feels as if he's able to get up and move around without fatigue and SOB   Objective: Filed Vitals:   08/21/13 0810 08/21/13 0815 08/21/13 0820 08/21/13 0900  BP: 152/84 152/84 151/97 157/87  Pulse: 78 79 79 78  Temp:  98.4 F (36.9 C)  98.4 F (36.9 C)  TempSrc:  Oral  Oral  Resp: 19 21 21 20   Height:  Weight:      SpO2: 94% 95% 96% 96%    Intake/Output Summary (Last 24 hours) at 08/21/13 1231 Last data filed at 08/21/13 1100  Gross per 24 hour  Intake    703 ml  Output   1950 ml  Net  -1247 ml   Filed Weights   08/19/13 1820 08/21/13 0500  Weight: 123.378 kg (272 lb) 123.197 kg (271 lb 9.6 oz)    Exam:   General:  A./O. x4, NAD  Cardiovascular: Regular rhythm and rate, negative murmur or gallops, DP/PT pulse one plus bilateral  Respiratory:  Good auscultation bilateral  Abdomen: Obese, soft, nontender, plus bowel sounds   Musculoskeletal: Negative pedal edema   Data Reviewed: Basic Metabolic Panel:  Recent Labs Lab 08/19/13 1518 08/20/13 0828 08/21/13 0915  NA 135 137 139  K 3.9 4.5 3.9  CL 96 102 103  CO2 27 26 28   GLUCOSE 274* 209* 179*  BUN 11 8 8   CREATININE 0.94 0.85 0.86  CALCIUM 8.9 8.2* 8.3*   Liver Function Tests:  Recent Labs Lab 08/19/13 1518  AST 13  ALT 9  ALKPHOS 129*  BILITOT 0.2*  PROT 6.7  ALBUMIN 3.2*   No results found for this basename: LIPASE, AMYLASE,  in the last 168 hours No results found for this basename: AMMONIA,  in the last 168 hours CBC:  Recent Labs Lab 08/19/13 1518 08/20/13 0828 08/21/13 0915  WBC 7.0 7.1 7.5  HGB 7.1* 9.1* 8.7*  HCT 24.8* 30.1* 28.7*  MCV 81.6 81.8 81.3  PLT 301 253 245   Cardiac Enzymes:  Recent Labs Lab 08/19/13 1531 08/19/13 2110 08/20/13 0828  TROPONINI <0.30 <0.30 <0.30   BNP (last 3 results)  Recent Labs  12/04/12 1335 05/14/13 2055 08/19/13 1531  PROBNP 1065.0* 327.8* 596.9*   CBG:  Recent Labs Lab 08/20/13 1226 08/20/13 1642 08/20/13 2141 08/21/13 0904 08/21/13 1124  GLUCAP 202* 215* 262* 182* 207*    No results found for this or any previous visit (from the past 240 hour(s)).   Studies: Dg Chest 2 View  08/19/2013   CLINICAL DATA:  Chest pain.  EXAM: CHEST  2 VIEW  COMPARISON:  05/14/2013.  FINDINGS: Of the heart is upper limits of normal and stable. The mediastinal and hilar contours are unchanged. 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 Stable basilar scarring changes and probable emphysematous changes. No acute with pulmonary findings. No pleural effusion or pneumothorax. The bony thorax is intact.  IMPRESSION: Stable emphysematous and pulmonary scarring changes. No acute pulmonary findings.   Electronically Signed   By: Loralie Champagne M.D.   On: 08/19/2013 18:37    Scheduled Meds: . albuterol  2.5  mg Nebulization Q6H  . atorvastatin  40 mg Oral Daily  . budesonide-formoterol  2 puff Inhalation BID  . citalopram  20 mg Oral Daily  . folic acid  1 mg Oral BID  . insulin aspart  0-9 Units Subcutaneous TID WC  . iron polysaccharides  150 mg Oral BID  . metoprolol tartrate  25 mg Oral BID  . pantoprazole  40 mg Oral BID  . sodium chloride  3 mL Intravenous Q12H  . tiotropium  18 mcg Inhalation Daily  . zolpidem  5 mg Oral QHS   Continuous Infusions:   Active Problems:   Blood loss anemia    Time spent: 45 minutes    WOODS, CURTIS, J  [facility] Pager [phone]. If 7PM-7AM, please contact night-coverage at [email or web address], password [facility]  08/21/2013, 12:31 PM  LOS: 2 days

## 2013-08-21 NOTE — H&P (View-Only) (Signed)
[facility] PROGRESS NOTE  [name] ZOX:[id number] DOB: [birth year] DOA: 08/19/2013 PCP: [name], MD  Brief narrative: 72 year old male with a history of COPD, coronary artery disease status post CABG, GI bleed, duodenal AVM, and diabetes mellulitis presented with one-week history of worsening dyspnea on exertion associated with chest discomfort, fatigue, and one episode of syncope that occurred 2 days prior to this admission while pt was trying to get up from comode. Please note that pt has had multiple endoscopies in the past including EGD on 12/06/2012 which showed gastric polyps and the source of bleed determined to be duodenal AVM as well as gastric polyp.  In the ED, the patient was found to have a hemoglobin of 7.1. He was hemodynamically stable. 2 units PRBCs have been ordered. [facility] asked to admit for further evaluation.   Assessment/Plan:   Principal Problem: Acute blood loss anemia in patient with history of duodenal AVM - likely source duodenal AVM; GI consulted and appreciate their recommendations  - Baseline hemoglobin 8-9 and on this admission 7.1, FOBT initially positive, second test negative  - transfused 2 units PRBC 11/03, post transfusion Hg pending this AM - continue oral iron supplementation  Active Problems: History of duodenal AVM  - follow up with GI recommendations - aspirin on hold for now due to blood loss anemia Syncope/dizziness  - Likely due to the patient's acute on chronic blood loss anemia  - cardiac enzymes are negative so far Chronic diastolic CHF  - Appears euvolemic on exam, no signs of volume overload  - based on last 2 D CHO in 11/2012 with essentially normal EF and grade 1 diastolic dysfunction - will d/c IVF as pt is getting blood products  - Continue metoprolol tartrate  - monitor daily weights and I's and O's COPD  -Continue long-acting beta agonist, Spiriva, albuterol when necessary shortness of breath  - monitor vitals per  floor protocol  Coronary artery disease with history of CABG  - EKG remains unchanged with right bundle branch block compared to previous EKG's - aspirin on hold due to blood loss anemia Diabetes mellitus type 2  - A1c pending - place on SSI for now   Studies: Dg Chest 2 View  08/19/2013   Stable emphysematous and pulmonary scarring changes. No acute pulmonary findings.    Code Status: full code Family Communication: no family at the bedside Disposition Plan: home when stable; follow up PT evaluation  Manson Passey, MD  [facility] Pager [phone]  If 7PM-7AM, please contact night-coverage [email or web address] Password [email or web address]  08/20/2013, 6:42 AM   LOS: 1 day   Consultants:  GI  Procedures:  None  Antibiotics:  None  HPI/Subjective: No events overnight. Pt denies shortness of breath this am and no chest pain.  Objective: Filed Vitals:   08/20/13 0420 08/20/13 0520 08/20/13 0540 08/20/13 0611  BP: 130/75 138/83 129/83 132/71  Pulse: 74 74 72 77  Temp: 97.5 F (36.4 C) 97.6 F (36.4 C) 97.6 F (36.4 C) 97.2 F (36.2 C)  TempSrc: Oral Oral Oral Oral  Resp: 20 20 20 18   Height:      Weight:      SpO2: 99% 99% 100% 100%    Intake/Output Summary (Last 24 hours) at 08/20/13 0642 Last data filed at 08/20/13 0540  Gross per 24 hour  Intake    650 ml  Output   1375 ml  Net   -725 ml    Exam:   General:  Pt is alert,  follows commands appropriately, not in acute distress  Cardiovascular: Regular rate and rhythm, S1/S2, no murmurs, no rubs, no gallops  Respiratory: Clear to auscultation bilaterally, no wheezing, no crackles, no rhonchi  Abdomen: Soft, non tender, non distended, bowel sounds present, no guarding  Extremities: No edema, pulses DP and PT palpable bilaterally  Neuro: Grossly nonfocal  Data Reviewed: Basic Metabolic Panel:  Recent Labs Lab 08/19/13 1518  NA 135  K 3.9  CL 96  CO2 27  GLUCOSE 274*  BUN 11  CREATININE 0.94  CALCIUM  8.9   Liver Function Tests:  Recent Labs Lab 08/19/13 1518  AST 13  ALT 9  ALKPHOS 129*  BILITOT 0.2*  PROT 6.7  ALBUMIN 3.2*   CBC:  Recent Labs Lab 08/19/13 1518  WBC 7.0  HGB 7.1*  HCT 24.8*  MCV 81.6  PLT 301   Cardiac Enzymes:  Recent Labs Lab 08/19/13 1531 08/19/13 2110  TROPONINI <0.30 <0.30   CBG:  Recent Labs Lab 08/19/13 2113  GLUCAP 192*   Scheduled Meds: . atorvastatin  40 mg Oral Daily  . budesonide-formoterol  2 puff Inhalation BID  . citalopram  20 mg Oral Daily  . folic acid  1 mg Oral BID  . insulin aspart  0-9 Units Subcutaneous TID WC  . iron polysaccharides  150 mg Oral BID  . metoprolol tartrate  25 mg Oral BID  . pantoprazole  40 mg Oral BID  . tiotropium  18 mcg Inhalation Daily  . zolpidem  5 mg Oral QHS   Continuous Infusions: . sodium chloride 125 mL/hr at 08/20/13 0550

## 2013-08-21 NOTE — Progress Notes (Signed)
I have reviewed this note and agree with all findings. Kati Stormy Connon, PT, DPT Pager: [phone]   

## 2013-08-21 NOTE — Progress Notes (Signed)
Patient requested breathing treatment, stating that he was experiencing expiratory wheezing.  Respiratory therapy paged and treatment administered.  Will continue to monitor.

## 2013-08-22 ENCOUNTER — Encounter (HOSPITAL_COMMUNITY): Payer: Self-pay | Admitting: Gastroenterology

## 2013-08-22 ENCOUNTER — Telehealth: Payer: Self-pay | Admitting: *Deleted

## 2013-08-22 DIAGNOSIS — K31811 Angiodysplasia of stomach and duodenum with bleeding: Principal | ICD-10-CM

## 2013-08-22 DIAGNOSIS — F329 Major depressive disorder, single episode, unspecified: Secondary | ICD-10-CM

## 2013-08-22 LAB — COMPREHENSIVE METABOLIC PANEL
ALT: 9 U/L (ref 0–53)
AST: 11 U/L (ref 0–37)
Alkaline Phosphatase: 138 U/L — ABNORMAL HIGH (ref 39–117)
BUN: 7 mg/dL (ref 6–23)
Creatinine, Ser: 0.89 mg/dL (ref 0.50–1.35)
GFR calc non Af Amer: 84 mL/min — ABNORMAL LOW (ref >90)
Glucose, Bld: 252 mg/dL — ABNORMAL HIGH (ref 70–99)
Total Bilirubin: 0.3 mg/dL (ref 0.3–1.2)

## 2013-08-22 LAB — GLUCOSE, CAPILLARY
Glucose-Capillary: 171 mg/dL — ABNORMAL HIGH (ref 70–99)
Glucose-Capillary: 193 mg/dL — ABNORMAL HIGH (ref 70–99)
Glucose-Capillary: 206 mg/dL — ABNORMAL HIGH (ref 70–99)

## 2013-08-22 LAB — CBC WITH DIFFERENTIAL/PLATELET
Basophils Absolute: 0 10*3/uL (ref 0.0–0.1)
Eosinophils Absolute: 0.2 10*3/uL (ref 0.0–0.7)
Eosinophils Relative: 3 % (ref 0–5)
Lymphs Abs: 0.8 10*3/uL (ref 0.7–4.0)
MCH: 24.3 pg — ABNORMAL LOW (ref 26.0–34.0)
MCHC: 29.6 g/dL — ABNORMAL LOW (ref 30.0–36.0)
MCV: 82.2 fL (ref 78.0–100.0)
Monocytes Absolute: 0.7 10*3/uL (ref 0.1–1.0)
Neutrophils Relative %: 78 % — ABNORMAL HIGH (ref 43–77)
Platelets: 220 10*3/uL (ref 150–400)
RBC: 3.66 MIL/uL — ABNORMAL LOW (ref 4.22–5.81)
RDW: 18.1 % — ABNORMAL HIGH (ref 11.5–15.5)
WBC: 8.1 10*3/uL (ref 4.0–10.5)

## 2013-08-22 LAB — MAGNESIUM: Magnesium: 2.2 mg/dL (ref 1.5–2.5)

## 2013-08-22 MED ORDER — CITALOPRAM HYDROBROMIDE 40 MG PO TABS: 40.0000 mg | ORAL_TABLET | Freq: Every day | ORAL | Status: DC

## 2013-08-22 MED ORDER — CITALOPRAM HYDROBROMIDE 20 MG PO TABS
20.0000 mg | ORAL_TABLET | Freq: Every day | ORAL | Status: DC
  Administered 2013-08-22: 20 mg via ORAL
  Filled 2013-08-22: qty 1

## 2013-08-22 NOTE — Telephone Encounter (Signed)
[name], can you help me with this??

## 2013-08-22 NOTE — Evaluation (Addendum)
Physical Therapy Evaluation Patient Details Name: [name] MRN: [id number] DOB: 1941/05/07 Today's Date: 08/22/2013 Time: 0910-0929 PT Time Calculation (min): 19 min  PT Assessment / Plan / Recommendation History of Present Illness  anemia, duodenal Ateriovenous malformation  Clinical Impression  *Pt admitted with blood loss anemia*. Pt currently with functional limitations due to the deficits listed below (see PT Problem List).  Pt will benefit from skilled PT to increase their independence and safety with mobility to allow discharge to the venue listed below.   **    PT Assessment  Patient needs continued PT services    Follow Up Recommendations  Home health PT    Does the patient have the potential to tolerate intense rehabilitation      Barriers to Discharge        Equipment Recommendations  None recommended by PT    Recommendations for Other Services OT consult   Frequency Min 3X/week    Precautions / Restrictions Precautions Precautions: Fall -pt reported 2-3 falls PTA Restrictions Weight Bearing Restrictions: No   Pertinent Vitals/Pain *7/10 LBP with walking; 4-5/10 after 5 min rest RN notified, pain meds requested SaO2 95% on 3L O2 [place] while walking, 2-3/4 dyspnea**      Mobility  Bed Mobility Bed Mobility: Supine to Sit Supine to Sit: 6: Modified independent (Device/Increase time);HOB elevated;With rails Transfers Transfers: Sit to Stand;Stand to Sit Sit to Stand: 6: Modified independent (Device/Increase time);With upper extremity assist;From bed Stand to Sit: 6: Modified independent (Device/Increase time);With upper extremity assist;To bed Ambulation/Gait Ambulation/Gait Assistance: 4: Min guard Ambulation Distance (Feet): 180 Feet -pt stated this is the most he's walked in 6 months Assistive device: Rolling walker Ambulation/Gait Assistance Details: pt walked with 3L O2, SaO2 95%, 2-3/4 dyspnea while walking, VCs for pursed lip breathing Gait  Pattern: Within Functional Limits Gait velocity: WFL General Gait Details: min/guard 2* h/o several falls PTA, no LOB while walking, distance limited by SOB/ LBP    Exercises     PT Diagnosis: Generalized weakness  PT Problem List: Decreased activity tolerance;Cardiopulmonary status limiting activity;Obesity;Pain;Decreased mobility PT Treatment Interventions: Gait training;Functional mobility training;Therapeutic exercise;Therapeutic activities;Patient/family education     PT Goals(Current goals can be found in the care plan section) Acute Rehab PT Goals Patient Stated Goal: to be able to walk farther PT Goal Formulation: With patient Time For Goal Achievement: 09/05/13 Potential to Achieve Goals: Good  Visit Information  Last PT Received On: 08/22/13 Assistance Needed: +1 History of Present Illness: anemia, duodenal Ateriovenous malformation       Prior Functioning  Home Living Family/patient expects to be discharged to:: Private residence Available Help at Discharge: Family Type of Home: House Home Access: Stairs to enter Secretary/administrator of Steps: 4 Home Layout: One level Home Equipment: Environmental consultant - 2 wheels;Bedside commode;Cane - single point;Hospital bed Prior Function Level of Independence: Needs assistance Gait / Transfers Assistance Needed: was only walking without assistive device to bathroom at home, limited by SOB Communication Communication: No difficulties Dominant Hand: Right    Cognition  Cognition Arousal/Alertness: Awake/alert Behavior During Therapy: WFL for tasks assessed/performed Overall Cognitive Status: Within Functional Limits for tasks assessed    Extremity/Trunk Assessment Upper Extremity Assessment Upper Extremity Assessment: Overall WFL for tasks assessed Lower Extremity Assessment Lower Extremity Assessment: Overall WFL for tasks assessed Cervical / Trunk Assessment Cervical / Trunk Assessment: Normal   Balance Balance Balance  Assessed: Yes Static Sitting Balance Static Sitting - Balance Support: Feet supported;No upper extremity supported Static Sitting -  Level of Assistance: 7: Independent Static Sitting - Comment/# of Minutes: 4  End of Session PT - End of Session Equipment Utilized During Treatment: Gait belt;Oxygen Activity Tolerance: Patient limited by fatigue;Patient limited by pain Patient left: in bed;with call bell/phone within reach;with nursing/sitter in room Nurse Communication: Mobility status  GP     Ralene Bathe Kistler 08/22/2013, 9:44 AM [phone]

## 2013-08-22 NOTE — Discharge Summary (Signed)
Physician Discharge Summary  TORRIN CRIHFIELD WUJ:[id number] DOB: 05-10-1941 DOA: 08/19/2013  PCP: [name], MD  Admit date: 08/19/2013 Discharge date: 08/22/2013  Time spent: 35 minutes  Recommendations for Outpatient Follow-up:  Acute blood loss anemia in patient with history of duodenal AVM  - EGD showed source of bleeding to be polyps/AVM in the proximal gastric body. See below for full report  - Baseline hemoglobin 8-9 and on this admission 7.1, FOBT initially positive, second test negative  - transfused 2 units PRBC 11/03, post transfusion Hg pending this AM  - continue oral iron supplementation  -patient's H&H overnight stable; ready for Discharge   History of duodenal AVM  - we'll have patient follow up with GI in 2 weeks to 30 days.  - continue to hold aspirin secondary to recent blood loss.    Syncope/dizziness  - Likely due to the patient's acute on chronic blood loss anemia  - cardiac enzymes are negative so far  -Am Orthostatic Vitals; within normal limits (not orthostatic)   Chronic diastolic CHF  - Appears euvolemic on exam, no signs of volume overload  - based on last 2 D CHO in 11/2012 with essentially normal EF and grade 1 diastolic dysfunction  - will d/c IVF as pt is getting blood products  - Restarted Metoprolol 25 mg BID; BP remains slightly outside of AHA guidelines, however significantly improved.. Will discharge on current dose of beta blocker.    COPD  -Continue long-acting beta agonist, Spiriva, albuterol PRN    Coronary artery disease with history of CABG  - EKG remains unchanged with right bundle branch block compared to previous EKG's  - aspirin on hold due to blood loss anemia   Diabetes mellitus type 2  - Hemoglobin A1c 08/20/2013= 6.7  - Will resume patient's home DM medication regimen.   Depression(chronic problem) -patient not homicidal or suicidal may be addressed by PCP - Increase Celexa to 40 mg Daily   Discharge Diagnoses:   Principal Problem:   AVM (arteriovenous malformation) of stomach, acquired with hemorrhage Active Problems:   HYPERTENSION   COPD (chronic obstructive pulmonary disease)   Diastolic HF (heart failure)   Chronic GI bleeding   Blood loss anemia   Discharge Condition: stable  Diet recommendation: ADA  Filed Weights   08/19/13 1820 08/21/13 0500 08/22/13 0500  Weight: 123.378 kg (272 lb) 123.197 kg (271 lb 9.6 oz) 123.197 kg (271 lb 9.6 oz)    History of present illness:  72 year old male with a history of COPD, coronary artery disease status post CABG, GI bleed, duodenal AVM, and diabetes mellulitis presented with one-week history of worsening dyspnea on exertion associated with chest discomfort, fatigue, and one episode of syncope that occurred 2 days prior to this admission while pt was trying to get up from comode. Please note that pt has had multiple endoscopies in the past including EGD on 12/06/2012 which showed gastric polyps and the source of bleed determined to be duodenal AVM as well as gastric polyp.  In the ED, the patient was found to have a hemoglobin of 7.1. He was hemodynamically stable. 2 units PRBCs have been ordered. TRH asked to admit for further evaluation. 11/5/2014states feeling greatly improved, feels as if he's able to get up and move around without fatigue and SOB. TODAY he was more depressed, however is not suicidal or homicidal. Also states feels as if right ear is clogged and request that we irrigate prior to discharge   Consultants:  Gastroenterology (Dr.  Jeani Hawking)   Procedures:  EGD 08/21/2013 -proximal gastric body there was evidence of the polypoid lesions previously identified.  ? Inflammatory polyps. One of the polyps displayed mild oozing, but with with prolonged observation other sites of bleeding were noted.  -Bleeding from the polypoid lesions as well as AVMs. All bleeding sites were treated wtih APC, however, two of the sites continued to  bleed. Two hemoclips were deployed across the base of these sites.  -No duodenal AVMs noted.    Antibiotics:     Discharge Exam: Filed Vitals:   08/22/13 0507 08/22/13 0509 08/22/13 0846 08/22/13 0924  BP: 121/82 124/70  138/68  Pulse:    95  Temp:      TempSrc:      Resp:      Height:      Weight:      SpO2:   94%    General: A./O. x4, NAD  Cardiovascular: Regular rhythm and rate, negative murmur or gallops, DP/PT pulse one plus bilateral  Respiratory: Good auscultation bilateral  Abdomen: Obese, soft, nontender, plus bowel sounds  Musculoskeletal: Negative pedal edema   Discharge Instructions   Future Appointments Provider Department Dept Phone   10/21/2013 10:30 AM [facility] [place] [facility] [address] [phone]   Eat a light meal the night before the exam but please avoid gaseous foods.   Nothing to eat or drink for at least 8 hours prior to the exam. No gum chewing or smoking the morning of the exam. Please take your morning medications with small sips of water, especially blood pressure medication. If you have several vascular lab exams and will see physician, please bring a snack with you.   10/21/2013 11:00 AM Carma Lair Nickel, NP [facility] [phone]       Medication List    ASK your doctor about these medications       albuterol 108 (90 BASE) MCG/ACT inhaler  Commonly known as:  PROVENTIL HFA;VENTOLIN HFA  Inhale 2 puffs into the lungs every 6 (six) hours as needed for wheezing.     ALPRAZolam 0.25 MG tablet  Commonly known as:  XANAX  Take 0.25 mg by mouth 3 (three) times daily as needed for sleep or anxiety.     aspirin 325 MG EC tablet  Take 325 mg by mouth daily.     atorvastatin 40 MG tablet  Commonly known as:  LIPITOR  Take 40 mg by mouth daily.     budesonide-formoterol 160-4.5 MCG/ACT inhaler  Commonly known as:  SYMBICORT  Inhale 2 puffs into the lungs 2 (two) times daily.     citalopram 20  MG tablet  Commonly known as:  CELEXA  Take 20 mg by mouth daily.     clobetasol ointment 0.05 %  Commonly known as:  TEMOVATE  Apply 1 application topically 2 (two) times daily as needed (rash).     folic acid 1 MG tablet  Commonly known as:  FOLVITE  Take 1 mg by mouth 2 (two) times daily.     HYDROcodone-acetaminophen 5-325 MG per tablet  Commonly known as:  NORCO/VICODIN  Take 1 tablet by mouth every 6 (six) hours as needed for pain.     iron polysaccharides 150 MG capsule  Commonly known as:  NIFEREX  Take 150 mg by mouth 2 (two) times daily.     metFORMIN 500 MG tablet  Commonly known as:  GLUCOPHAGE  Take 500 mg by mouth 2 (two) times  daily with a meal.     metoprolol tartrate 25 MG tablet  Commonly known as:  LOPRESSOR  Take 25 mg by mouth 2 (two) times daily.     pantoprazole 40 MG tablet  Commonly known as:  PROTONIX  Take 40 mg by mouth 2 (two) times daily.     tiotropium 18 MCG inhalation capsule  Commonly known as:  SPIRIVA  Place 18 mcg into inhaler and inhale daily.     zolpidem 10 MG tablet  Commonly known as:  AMBIEN  Take 10 mg by mouth at bedtime.       Allergies  Allergen Reactions  . Penicillins Anaphylaxis and Hives  . Demerol [Meperidine] Other (See Comments)    hallucinations  . Levaquin [Levofloxacin In D5w]     Nausea and diarrhea  . Morphine And Related Nausea Only      The results of significant diagnostics from this hospitalization (including imaging, microbiology, ancillary and laboratory) are listed below for reference.    Significant Diagnostic Studies: Dg Chest 2 View  08/19/2013   CLINICAL DATA:  Chest pain.  EXAM: CHEST  2 VIEW  COMPARISON:  05/14/2013.  FINDINGS: Of the heart is upper limits of normal and stable. The mediastinal and hilar contours are unchanged. 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 Stable basilar scarring changes and probable emphysematous changes. No acute with pulmonary findings. No pleural  effusion or pneumothorax. The bony thorax is intact.  IMPRESSION: Stable emphysematous and pulmonary scarring changes. No acute pulmonary findings.   Electronically Signed   By: Loralie Champagne M.D.   On: 08/19/2013 18:37    Microbiology: No results found for this or any previous visit (from the past 240 hour(s)).   Labs: Basic Metabolic Panel:  Recent Labs Lab 08/19/13 1518 08/20/13 0828 08/21/13 0915 08/22/13 0810  NA 135 137 139 136  K 3.9 4.5 3.9 4.1  CL 96 102 103 100  CO2 27 26 28 28   GLUCOSE 274* 209* 179* 252*  BUN 11 8 8 7   CREATININE 0.94 0.85 0.86 0.89  CALCIUM 8.9 8.2* 8.3* 8.5  MG  --   --   --  2.2   Liver Function Tests:  Recent Labs Lab 08/19/13 1518 08/22/13 0810  AST 13 11  ALT 9 9  ALKPHOS 129* 138*  BILITOT 0.2* 0.3  PROT 6.7 6.5  ALBUMIN 3.2* 3.0*   No results found for this basename: LIPASE, AMYLASE,  in the last 168 hours No results found for this basename: AMMONIA,  in the last 168 hours CBC:  Recent Labs Lab 08/19/13 1518 08/20/13 0828 08/21/13 0915 08/22/13 0810  WBC 7.0 7.1 7.5 8.1  NEUTROABS  --   --   --  6.4  HGB 7.1* 9.1* 8.7* 8.9*  HCT 24.8* 30.1* 28.7* 30.1*  MCV 81.6 81.8 81.3 82.2  PLT 301 253 245 220   Cardiac Enzymes:  Recent Labs Lab 08/19/13 1531 08/19/13 2110 08/20/13 0828  TROPONINI <0.30 <0.30 <0.30   BNP: BNP (last 3 results)  Recent Labs  12/04/12 1335 05/14/13 2055 08/19/13 1531  PROBNP 1065.0* 327.8* 596.9*   CBG:  Recent Labs Lab 08/21/13 1610 08/21/13 1947 08/22/13 0028 08/22/13 0504 08/22/13 0842  GLUCAP 183* 207* 171* 193* 233*       Signed:  Carolyne Littles, MD [facility] [phone] pager

## 2013-08-22 NOTE — Progress Notes (Signed)
Patient discharged to home with family.  IV removed prior to discharge, IV site clean, dry, and intact.  Discharge teaching performed prior to discharge.  Patient voiced understanding of discharge instructions.

## 2013-08-22 NOTE — Telephone Encounter (Signed)
     From: Lorre Munroe        Sent: Thursday August 22, 2013        Geraldine Contras, Can you please call patient tommorow and document a transitional care management phone note and also have the patient make a follow up appt for next week. Regina ----- Message ----- From: Drema Dallas, MD Sent: 08/22/2013 10:32 AM To: Dianne Dun, MD                           Geraldine Contras,    Can you please call patient tommorow and document a transitional care management phone note and also have the patient make a follow up appt for next week.    Rene Kocher    ----- Message -----    From: Lorre Munroe    Sent: 08/22/2013 10:32 AM    To: Dianne Dun, MD

## 2013-08-23 NOTE — Telephone Encounter (Signed)
Appt scheduled

## 2013-08-27 ENCOUNTER — Ambulatory Visit: Payer: Medicare Other | Admitting: Internal Medicine

## 2013-08-27 DIAGNOSIS — Z0289 Encounter for other administrative examinations: Secondary | ICD-10-CM

## 2013-08-28 ENCOUNTER — Ambulatory Visit (INDEPENDENT_AMBULATORY_CARE_PROVIDER_SITE_OTHER): Payer: Medicare Other | Admitting: Internal Medicine

## 2013-08-28 ENCOUNTER — Encounter: Payer: Self-pay | Admitting: Internal Medicine

## 2013-08-28 VITALS — BP 146/72 | HR 81 | Temp 98.4°F | Wt 269.8 lb

## 2013-08-28 DIAGNOSIS — H6123 Impacted cerumen, bilateral: Secondary | ICD-10-CM

## 2013-08-28 DIAGNOSIS — H6120 Impacted cerumen, unspecified ear: Secondary | ICD-10-CM

## 2013-08-28 DIAGNOSIS — J438 Other emphysema: Secondary | ICD-10-CM

## 2013-08-28 DIAGNOSIS — J439 Emphysema, unspecified: Secondary | ICD-10-CM

## 2013-08-28 NOTE — Progress Notes (Signed)
HPI  Pt presents to the clinic today with c/o stopped up ears. He does report fullness and decreased hearing. This started about 2-3 weeks ago. He has not tried anything OTC for fear that it would interact with his medications. He also c/o headache, and chest congestion. These symptoms started a week later. He does have a history of COPD and emphysema and is on 3L O2. He denies fever, chills or body aches. He denies chest pain, chest tightness or shortness of breath.  Review of Systems      Past Medical History  Diagnosis Date  . Allergic rhinitis, cause unspecified   . Other and unspecified hyperlipidemia   . 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 Unspecified essential hypertension   . 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 Severe bullous disease of LUL. Prolonged intubation after surgeries due to COPD  . 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 Anemia     Recurrent microcytic, presumably GI   . Complication of anesthesia     trouble getting off ventilator  . Memory loss   . Diabetes     Family History  Problem Relation Age of Onset  . Emphysema Mother   . Heart disease Mother   . ALS Father   . Heart disease Mother   . Diabetes Sister     History   Social History  . Marital Status: Married    Spouse Name: N/A    Number of Children: N/A  . Years of Education: N/A   Occupational History  . Retired    Social History Main Topics  . Smoking status: Former Smoker -- 2.00 packs/day for 50 years    Types: Cigarettes    Quit date: 11/18/2009  . Smokeless tobacco: Never Used  . Alcohol Use: Yes  . Drug Use: No  . Sexual Activity: No   Other Topics Concern  . Not on file   Social History Narrative   LIves at home with his wife.      Allergies  Allergen Reactions  . Penicillins Anaphylaxis and  Hives  . Demerol [Meperidine] Other (See Comments)    hallucinations  . Levaquin [Levofloxacin In D5w]     Nausea and diarrhea  . Morphine And Related Nausea Only     Constitutional: Denies headache, fever or abrupt weight changes.  HEENT:   Denies eye redness, eye pain, pressure behind the eyes, facial pain, nasal congestion,  ringing in the ears, wax buildup, runny nose or bloody nose. Respiratory:  Denies cough, difficulty breathing or shortness of breath.  Cardiovascular: Denies chest pain, chest tightness, palpitations or swelling in the hands or feet.   No other specific complaints in a complete review of systems (except as listed in HPI above).  Objective:   BP 146/72  Pulse 81  Temp(Src) 98.4 F (36.9 C) (Oral)  Wt 269 lb 12 oz (122.358 kg)  SpO2 97% Wt Readings from Last 3 Encounters:  08/28/13 269 lb 12 oz (122.358 kg)  08/22/13 271 lb 9.6 oz (123.197 kg)  08/22/13 271 lb 9.6 oz (123.197 kg)     General: Appears his stated age, chronically ill appearing in NAD. HEENT: Head: normal shape and size; Eyes: sclera white, no icterus, conjunctiva pink, PERRLA and EOMs intact; Ears: Bilateral cerumen impaction; Nose: mucosa pink and moist, septum  midline; Throat/Mouth: + PND. Teeth present, mucosa erythematous and moist, no exudate noted, no lesions or ulcerations noted.  Neck: Neck supple, trachea midline. No massses, lumps or thyromegaly present.  Cardiovascular: Normal rate and rhythm. S1,S2 noted.  No murmur, rubs or gallops noted. No JVD or BLE edema. No carotid bruits noted. Pulmonary/Chest: Normal effort and diminshed vesicular breath sounds. No respiratory distress. No wheezes, rales or ronchi noted.      Assessment & Plan:   Bilateral cerumen imapction  Lavage by RN today Try Debrox OTC  COPD:  No exacerbation as of now Continue your inhalers and O2 as prescribed Monitor for increased SOB, productive cough, fever or chills  RTC as needed or if symptoms  persist.

## 2013-08-28 NOTE — Patient Instructions (Signed)
Cerumen Impaction The structures of the external ear canal secrete a waxy substance known as cerumen. Excess cerumen can build up in the ear canal, causing a condition known as cerumen impaction. Cerumen impaction can cause ear pain as well as disrupt the function of the ear. The rate of cerumen production differs for each individual. For certain individuals, the configuration of one's ear canal may cause him or her to have a decreased ability to naturally remove cerumen. It is important to note that removing cerumen as a part of normal hygiene is not necessary, and the use of swabs in the ear canal is not recommended. SYMPTOMS   Diminished hearing.  Ear drainage.  Ear pain.  Ear itch. CAUSES  Excessive cerumen production.  RISK INCREASES WITH:  Frequent use of swabs to clean ears.  Narrow ear canals.  Eczema (a skin condition).  Dehydration. PREVENTION  Do Not insert objects into the ear, even with the intent of cleaning the ear.  Maintain hydration.  Control eczema if present. TREATMENT  Maintaining preventative measures is the best way to treat cerumen impaction. If symptoms of cerumen impaction develop the first step is to use over-the-counter or prescription ear drops that are intended to soften the cerumen. If the cerumen does not clear, then visit your caregiver to have the cerumen removed. The most common method for cerumen removal is through irrigation with warm water. Although, some caregivers use ear curettes and other instruments to remove the cerumen physically. In the most severe cases, cerumen may be removed surgically. Document Released: 10/03/2005 Document Revised: 12/26/2011 Document Reviewed: 01/15/2009 [employer] Patient Information 2014 [employer].  

## 2013-09-03 ENCOUNTER — Other Ambulatory Visit: Payer: Self-pay

## 2013-09-03 ENCOUNTER — Other Ambulatory Visit: Payer: Self-pay | Admitting: Family Medicine

## 2013-09-03 MED ORDER — HYDROCODONE-ACETAMINOPHEN 5-325 MG PO TABS: 1.0000 | ORAL_TABLET | Freq: Four times a day (QID) | ORAL | Status: DC | PRN

## 2013-09-03 NOTE — Telephone Encounter (Signed)
Received refill request electronically. Last office visit 08/28/13. Is it okay to refill medication?

## 2013-09-03 NOTE — Telephone Encounter (Signed)
RX printed signed and placed on blairs desk for her to call pt

## 2013-09-03 NOTE — Telephone Encounter (Signed)
Pt notified that RX ready to be picked up. 

## 2013-09-03 NOTE — Telephone Encounter (Signed)
Mrs Iovino request rx hydrocodone apap. Call when ready for pick up.

## 2013-09-03 NOTE — Telephone Encounter (Signed)
Ok to phone in.

## 2013-09-03 NOTE — Telephone Encounter (Signed)
Rx called to pharmacy as instructed. 

## 2013-09-06 ENCOUNTER — Telehealth: Payer: Self-pay | Admitting: Family Medicine

## 2013-09-06 NOTE — Telephone Encounter (Signed)
Pollie Meyer, PT assistant with [facility], left message stating that pt refused PT yesterday 11/20 for no reason.  He did not leave a CB number.

## 2013-09-07 ENCOUNTER — Inpatient Hospital Stay (HOSPITAL_COMMUNITY)
Admission: EM | Admit: 2013-09-07 | Discharge: 2013-09-10 | Disposition: A | Discharge status: Home Health | Payer: Medicare Other | Source: Home / Self Care | Attending: Internal Medicine | Admitting: Internal Medicine

## 2013-09-07 ENCOUNTER — Other Ambulatory Visit: Payer: Self-pay

## 2013-09-07 ENCOUNTER — Encounter (HOSPITAL_COMMUNITY): Payer: Self-pay | Admitting: Emergency Medicine

## 2013-09-07 ENCOUNTER — Emergency Department (HOSPITAL_COMMUNITY): Payer: Medicare Other

## 2013-09-07 DIAGNOSIS — J9610 Chronic respiratory failure, unspecified whether with hypoxia or hypercapnia: Secondary | ICD-10-CM | POA: Diagnosis present

## 2013-09-07 DIAGNOSIS — K922 Gastrointestinal hemorrhage, unspecified: Secondary | ICD-10-CM | POA: Diagnosis present

## 2013-09-07 DIAGNOSIS — E785 Hyperlipidemia, unspecified: Secondary | ICD-10-CM | POA: Diagnosis present

## 2013-09-07 DIAGNOSIS — F411 Generalized anxiety disorder: Secondary | ICD-10-CM | POA: Diagnosis present

## 2013-09-07 DIAGNOSIS — Z8249 Family history of ischemic heart disease and other diseases of the circulatory system: Secondary | ICD-10-CM

## 2013-09-07 DIAGNOSIS — R000 Tachycardia, unspecified: Secondary | ICD-10-CM

## 2013-09-07 DIAGNOSIS — E119 Type 2 diabetes mellitus without complications: Secondary | ICD-10-CM | POA: Diagnosis present

## 2013-09-07 DIAGNOSIS — D62 Acute posthemorrhagic anemia: Secondary | ICD-10-CM | POA: Diagnosis present

## 2013-09-07 DIAGNOSIS — Z9981 Dependence on supplemental oxygen: Secondary | ICD-10-CM

## 2013-09-07 DIAGNOSIS — J449 Chronic obstructive pulmonary disease, unspecified: Secondary | ICD-10-CM | POA: Diagnosis present

## 2013-09-07 DIAGNOSIS — Z951 Presence of aortocoronary bypass graft: Secondary | ICD-10-CM

## 2013-09-07 DIAGNOSIS — I219 Acute myocardial infarction, unspecified: Secondary | ICD-10-CM | POA: Diagnosis present

## 2013-09-07 DIAGNOSIS — J4489 Other specified chronic obstructive pulmonary disease: Secondary | ICD-10-CM | POA: Diagnosis present

## 2013-09-07 DIAGNOSIS — I2510 Atherosclerotic heart disease of native coronary artery without angina pectoris: Secondary | ICD-10-CM

## 2013-09-07 DIAGNOSIS — I10 Essential (primary) hypertension: Secondary | ICD-10-CM | POA: Diagnosis present

## 2013-09-07 DIAGNOSIS — R413 Other amnesia: Secondary | ICD-10-CM | POA: Diagnosis present

## 2013-09-07 DIAGNOSIS — D131 Benign neoplasm of stomach: Secondary | ICD-10-CM | POA: Diagnosis present

## 2013-09-07 DIAGNOSIS — I252 Old myocardial infarction: Secondary | ICD-10-CM

## 2013-09-07 DIAGNOSIS — D649 Anemia, unspecified: Secondary | ICD-10-CM

## 2013-09-07 DIAGNOSIS — K31811 Angiodysplasia of stomach and duodenum with bleeding: Secondary | ICD-10-CM

## 2013-09-07 DIAGNOSIS — R0600 Dyspnea, unspecified: Secondary | ICD-10-CM

## 2013-09-07 DIAGNOSIS — Z79899 Other long term (current) drug therapy: Secondary | ICD-10-CM

## 2013-09-07 DIAGNOSIS — J309 Allergic rhinitis, unspecified: Secondary | ICD-10-CM | POA: Diagnosis present

## 2013-09-07 DIAGNOSIS — I714 Abdominal aortic aneurysm, without rupture, unspecified: Secondary | ICD-10-CM | POA: Diagnosis present

## 2013-09-07 DIAGNOSIS — D500 Iron deficiency anemia secondary to blood loss (chronic): Secondary | ICD-10-CM | POA: Diagnosis present

## 2013-09-07 DIAGNOSIS — R739 Hyperglycemia, unspecified: Secondary | ICD-10-CM

## 2013-09-07 DIAGNOSIS — G4733 Obstructive sleep apnea (adult) (pediatric): Secondary | ICD-10-CM | POA: Diagnosis present

## 2013-09-07 DIAGNOSIS — R079 Chest pain, unspecified: Secondary | ICD-10-CM

## 2013-09-07 DIAGNOSIS — K921 Melena: Secondary | ICD-10-CM

## 2013-09-07 DIAGNOSIS — Z6834 Body mass index (BMI) 34.0-34.9, adult: Secondary | ICD-10-CM

## 2013-09-07 DIAGNOSIS — K317 Polyp of stomach and duodenum: Secondary | ICD-10-CM | POA: Diagnosis present

## 2013-09-07 DIAGNOSIS — Y838 Other surgical procedures as the cause of abnormal reaction of the patient, or of later complication, without mention of misadventure at the time of the procedure: Secondary | ICD-10-CM | POA: Diagnosis not present

## 2013-09-07 DIAGNOSIS — Z880 Allergy status to penicillin: Secondary | ICD-10-CM

## 2013-09-07 DIAGNOSIS — Z87891 Personal history of nicotine dependence: Secondary | ICD-10-CM

## 2013-09-07 DIAGNOSIS — K31819 Angiodysplasia of stomach and duodenum without bleeding: Secondary | ICD-10-CM | POA: Diagnosis present

## 2013-09-07 DIAGNOSIS — J9611 Chronic respiratory failure with hypoxia: Secondary | ICD-10-CM | POA: Diagnosis present

## 2013-09-07 DIAGNOSIS — R5383 Other fatigue: Secondary | ICD-10-CM

## 2013-09-07 DIAGNOSIS — Z833 Family history of diabetes mellitus: Secondary | ICD-10-CM

## 2013-09-07 DIAGNOSIS — Z836 Family history of other diseases of the respiratory system: Secondary | ICD-10-CM

## 2013-09-07 DIAGNOSIS — IMO0002 Reserved for concepts with insufficient information to code with codable children: Secondary | ICD-10-CM | POA: Diagnosis not present

## 2013-09-07 LAB — URINALYSIS, ROUTINE W REFLEX MICROSCOPIC
Bilirubin Urine: NEGATIVE
Glucose, UA: NEGATIVE mg/dL
Leukocytes, UA: NEGATIVE
Nitrite: NEGATIVE
Protein, ur: NEGATIVE mg/dL
Urobilinogen, UA: 0.2 mg/dL (ref 0.0–1.0)
pH: 5.5 (ref 5.0–8.0)

## 2013-09-07 LAB — COMPREHENSIVE METABOLIC PANEL
ALT: 9 U/L (ref 0–53)
AST: 13 U/L (ref 0–37)
Albumin: 3.3 g/dL — ABNORMAL LOW (ref 3.5–5.2)
Alkaline Phosphatase: 108 U/L (ref 39–117)
Calcium: 8.9 mg/dL (ref 8.4–10.5)
Chloride: 96 mEq/L (ref 96–112)
Creatinine, Ser: 0.91 mg/dL (ref 0.50–1.35)
GFR calc non Af Amer: 83 mL/min — ABNORMAL LOW (ref >90)
Glucose, Bld: 219 mg/dL — ABNORMAL HIGH (ref 70–99)
Total Bilirubin: 0.3 mg/dL (ref 0.3–1.2)

## 2013-09-07 LAB — CBC
Hemoglobin: 6.7 g/dL — CL (ref 13.0–17.0)
MCH: 23.7 pg — ABNORMAL LOW (ref 26.0–34.0)
MCHC: 28.8 g/dL — ABNORMAL LOW (ref 30.0–36.0)
MCV: 82.3 fL (ref 78.0–100.0)
RBC: 2.83 MIL/uL — ABNORMAL LOW (ref 4.22–5.81)
RDW: 17.4 % — ABNORMAL HIGH (ref 11.5–15.5)

## 2013-09-07 LAB — PROTIME-INR: Prothrombin Time: 13.7 seconds (ref 11.6–15.2)

## 2013-09-07 LAB — TROPONIN I: Troponin I: <0.3 ng/mL (ref <0.30)

## 2013-09-07 LAB — PRO B NATRIURETIC PEPTIDE: Pro B Natriuretic peptide (BNP): 293.6 pg/mL — ABNORMAL HIGH (ref 0–125)

## 2013-09-07 LAB — OCCULT BLOOD, POC DEVICE: Fecal Occult Bld: POSITIVE — AB

## 2013-09-07 MED ORDER — SODIUM CHLORIDE 0.9 % IV SOLN
80.0000 mg | Freq: Once | INTRAVENOUS | Status: AC
  Administered 2013-09-07: 80 mg via INTRAVENOUS
  Filled 2013-09-07: qty 80

## 2013-09-07 MED ORDER — SODIUM CHLORIDE 0.9 % IV SOLN
INTRAVENOUS | Status: DC
  Administered 2013-09-07: 21:00:00 via INTRAVENOUS

## 2013-09-07 MED ORDER — IPRATROPIUM BROMIDE 0.02 % IN SOLN
0.5000 mg | Freq: Once | RESPIRATORY_TRACT | Status: AC
  Administered 2013-09-07: 0.5 mg via RESPIRATORY_TRACT
  Filled 2013-09-07: qty 2.5

## 2013-09-07 MED ORDER — ALBUTEROL SULFATE (5 MG/ML) 0.5% IN NEBU
5.0000 mg | INHALATION_SOLUTION | Freq: Once | RESPIRATORY_TRACT | Status: AC
  Administered 2013-09-07: 5 mg via RESPIRATORY_TRACT
  Filled 2013-09-07: qty 1

## 2013-09-07 NOTE — ED Notes (Signed)
Bed: ZO10 Expected date:  Expected time:  Means of arrival:  Comments: EMS-SOB upon exertion
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
## 2013-09-07 NOTE — ED Notes (Signed)
Resp contacted for pt treatment.

## 2013-09-07 NOTE — ED Notes (Signed)
Per EMS pt has had SOB with exertion over 6 months worsened today.
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
## 2013-09-07 NOTE — ED Notes (Signed)
Per pt hx of GI bleed with recent surgery. Pt reports abd pain. Pt also reports SOB with exertion that has progressively gotten worse over 6 months.

## 2013-09-08 DIAGNOSIS — I2510 Atherosclerotic heart disease of native coronary artery without angina pectoris: Secondary | ICD-10-CM

## 2013-09-08 DIAGNOSIS — Q2733 Arteriovenous malformation of digestive system vessel: Secondary | ICD-10-CM

## 2013-09-08 DIAGNOSIS — I10 Essential (primary) hypertension: Secondary | ICD-10-CM

## 2013-09-08 DIAGNOSIS — E119 Type 2 diabetes mellitus without complications: Secondary | ICD-10-CM

## 2013-09-08 DIAGNOSIS — R5381 Other malaise: Secondary | ICD-10-CM

## 2013-09-08 DIAGNOSIS — D649 Anemia, unspecified: Secondary | ICD-10-CM

## 2013-09-08 DIAGNOSIS — R0609 Other forms of dyspnea: Secondary | ICD-10-CM

## 2013-09-08 DIAGNOSIS — K922 Gastrointestinal hemorrhage, unspecified: Secondary | ICD-10-CM

## 2013-09-08 DIAGNOSIS — K31811 Angiodysplasia of stomach and duodenum with bleeding: Principal | ICD-10-CM

## 2013-09-08 DIAGNOSIS — K921 Melena: Secondary | ICD-10-CM

## 2013-09-08 LAB — URINALYSIS, ROUTINE W REFLEX MICROSCOPIC
Glucose, UA: NEGATIVE mg/dL
Hgb urine dipstick: NEGATIVE
Ketones, ur: NEGATIVE mg/dL
Leukocytes, UA: NEGATIVE
Protein, ur: NEGATIVE mg/dL
pH: 5.5 (ref 5.0–8.0)

## 2013-09-08 LAB — COMPREHENSIVE METABOLIC PANEL
ALT: 6 U/L (ref 0–53)
AST: 10 U/L (ref 0–37)
Albumin: 3.2 g/dL — ABNORMAL LOW (ref 3.5–5.2)
Alkaline Phosphatase: 93 U/L (ref 39–117)
CO2: 27 mEq/L (ref 19–32)
Calcium: 8.8 mg/dL (ref 8.4–10.5)
Chloride: 103 mEq/L (ref 96–112)
GFR calc Af Amer: >90 mL/min (ref >90)
Glucose, Bld: 166 mg/dL — ABNORMAL HIGH (ref 70–99)
Potassium: 4 mEq/L (ref 3.5–5.1)
Sodium: 140 mEq/L (ref 135–145)
Total Bilirubin: 0.4 mg/dL (ref 0.3–1.2)

## 2013-09-08 LAB — CBC
HCT: 29.7 % — ABNORMAL LOW (ref 39.0–52.0)
HCT: 26.3 % — ABNORMAL LOW (ref 39.0–52.0)
Hemoglobin: 9.1 g/dL — ABNORMAL LOW (ref 13.0–17.0)
Hemoglobin: 8.1 g/dL — ABNORMAL LOW (ref 13.0–17.0)
MCH: 25.2 pg — ABNORMAL LOW (ref 26.0–34.0)
MCH: 25.6 pg — ABNORMAL LOW (ref 26.0–34.0)
MCHC: 30.6 g/dL (ref 30.0–36.0)
MCHC: 30.8 g/dL (ref 30.0–36.0)
MCV: 82.3 fL (ref 78.0–100.0)
MCV: 83 fL (ref 78.0–100.0)
RBC: 3.61 MIL/uL — ABNORMAL LOW (ref 4.22–5.81)
RBC: 3.17 MIL/uL — ABNORMAL LOW (ref 4.22–5.81)
RDW: 16.2 % — ABNORMAL HIGH (ref 11.5–15.5)

## 2013-09-08 LAB — GLUCOSE, CAPILLARY
Glucose-Capillary: 183 mg/dL — ABNORMAL HIGH (ref 70–99)
Glucose-Capillary: 158 mg/dL — ABNORMAL HIGH (ref 70–99)
Glucose-Capillary: 145 mg/dL — ABNORMAL HIGH (ref 70–99)
Glucose-Capillary: 196 mg/dL — ABNORMAL HIGH (ref 70–99)
Glucose-Capillary: 199 mg/dL — ABNORMAL HIGH (ref 70–99)
Glucose-Capillary: 192 mg/dL — ABNORMAL HIGH (ref 70–99)

## 2013-09-08 LAB — TSH: TSH: 2.582 u[IU]/mL (ref 0.350–4.500)

## 2013-09-08 LAB — TROPONIN I
Troponin I: <0.3 ng/mL (ref <0.30)
Troponin I: <0.3 ng/mL (ref <0.30)

## 2013-09-08 LAB — PHOSPHORUS: Phosphorus: 2.4 mg/dL (ref 2.3–4.6)

## 2013-09-08 LAB — PREPARE RBC (CROSSMATCH)

## 2013-09-08 MED ORDER — LEVALBUTEROL HCL 0.63 MG/3ML IN NEBU: 0.6300 mg | INHALATION_SOLUTION | Freq: Four times a day (QID) | RESPIRATORY_TRACT | Status: DC | PRN

## 2013-09-08 MED ORDER — ALPRAZOLAM 0.25 MG PO TABS
0.2500 mg | ORAL_TABLET | Freq: Three times a day (TID) | ORAL | Status: DC | PRN
  Administered 2013-09-08 – 2013-09-10 (×3): 0.25 mg via ORAL
  Filled 2013-09-08 (×3): qty 1

## 2013-09-08 MED ORDER — METOPROLOL TARTRATE 25 MG PO TABS
25.0000 mg | ORAL_TABLET | Freq: Two times a day (BID) | ORAL | Status: DC
  Administered 2013-09-08 – 2013-09-10 (×5): 25 mg via ORAL
  Filled 2013-09-08 (×7): qty 1

## 2013-09-08 MED ORDER — FOLIC ACID 1 MG PO TABS
1.0000 mg | ORAL_TABLET | Freq: Two times a day (BID) | ORAL | Status: DC
  Administered 2013-09-08 – 2013-09-10 (×4): 1 mg via ORAL
  Filled 2013-09-08 (×6): qty 1

## 2013-09-08 MED ORDER — DOCUSATE SODIUM 100 MG PO CAPS
100.0000 mg | ORAL_CAPSULE | Freq: Two times a day (BID) | ORAL | Status: DC
  Administered 2013-09-08 – 2013-09-10 (×4): 100 mg via ORAL
  Filled 2013-09-08 (×6): qty 1

## 2013-09-08 MED ORDER — PANTOPRAZOLE SODIUM 40 MG IV SOLR
40.0000 mg | Freq: Two times a day (BID) | INTRAVENOUS | Status: DC
  Administered 2013-09-08: 40 mg via INTRAVENOUS
  Filled 2013-09-08 (×2): qty 40

## 2013-09-08 MED ORDER — ALBUTEROL SULFATE HFA 108 (90 BASE) MCG/ACT IN AERS
2.0000 | INHALATION_SPRAY | Freq: Four times a day (QID) | RESPIRATORY_TRACT | Status: DC | PRN
  Filled 2013-09-08: qty 6.7

## 2013-09-08 MED ORDER — HYDROCODONE-ACETAMINOPHEN 5-325 MG PO TABS
1.0000 | ORAL_TABLET | ORAL | Status: DC | PRN
  Administered 2013-09-08 – 2013-09-10 (×6): 1 via ORAL
  Filled 2013-09-08 (×6): qty 1

## 2013-09-08 MED ORDER — HYDROCODONE-ACETAMINOPHEN 5-325 MG PO TABS: 1.0000 | ORAL_TABLET | Freq: Four times a day (QID) | ORAL | Status: DC | PRN

## 2013-09-08 MED ORDER — SODIUM CHLORIDE 0.9 % IV SOLN
INTRAVENOUS | Status: DC
  Administered 2013-09-08: 75 mL via INTRAVENOUS
  Administered 2013-09-08 – 2013-09-09 (×2): via INTRAVENOUS

## 2013-09-08 MED ORDER — ACETAMINOPHEN 325 MG PO TABS
650.0000 mg | ORAL_TABLET | Freq: Once | ORAL | Status: AC
  Administered 2013-09-08: 650 mg via ORAL
  Filled 2013-09-08: qty 2

## 2013-09-08 MED ORDER — ACETAMINOPHEN 650 MG RE SUPP: 650.0000 mg | Freq: Four times a day (QID) | RECTAL | Status: DC | PRN

## 2013-09-08 MED ORDER — ACETAMINOPHEN 325 MG PO TABS: 650.0000 mg | ORAL_TABLET | Freq: Four times a day (QID) | ORAL | Status: DC | PRN

## 2013-09-08 MED ORDER — DIPHENHYDRAMINE HCL 25 MG PO CAPS
25.0000 mg | ORAL_CAPSULE | Freq: Once | ORAL | Status: AC
  Administered 2013-09-08: 25 mg via ORAL
  Filled 2013-09-08: qty 1

## 2013-09-08 MED ORDER — ALBUTEROL SULFATE (5 MG/ML) 0.5% IN NEBU: 2.5000 mg | INHALATION_SOLUTION | RESPIRATORY_TRACT | Status: DC | PRN

## 2013-09-08 MED ORDER — INSULIN ASPART 100 UNIT/ML ~~LOC~~ SOLN
0.0000 [IU] | SUBCUTANEOUS | Status: DC
  Administered 2013-09-08: 2 [IU] via SUBCUTANEOUS
  Administered 2013-09-08: 1 [IU] via SUBCUTANEOUS
  Administered 2013-09-08 (×4): 2 [IU] via SUBCUTANEOUS
  Administered 2013-09-09: 3 [IU] via SUBCUTANEOUS
  Administered 2013-09-09 (×2): 2 [IU] via SUBCUTANEOUS
  Administered 2013-09-09: 01:00:00 3 [IU] via SUBCUTANEOUS
  Administered 2013-09-09: 2 [IU] via SUBCUTANEOUS
  Administered 2013-09-09 – 2013-09-10 (×2): 3 [IU] via SUBCUTANEOUS
  Administered 2013-09-10 (×2): 2 [IU] via SUBCUTANEOUS

## 2013-09-08 MED ORDER — CITALOPRAM HYDROBROMIDE 40 MG PO TABS
40.0000 mg | ORAL_TABLET | Freq: Every day | ORAL | Status: DC
  Administered 2013-09-08 – 2013-09-10 (×2): 40 mg via ORAL
  Filled 2013-09-08 (×3): qty 1

## 2013-09-08 MED ORDER — PANTOPRAZOLE SODIUM 40 MG PO TBEC
40.0000 mg | DELAYED_RELEASE_TABLET | Freq: Two times a day (BID) | ORAL | Status: DC
  Administered 2013-09-08 – 2013-09-10 (×3): 40 mg via ORAL
  Filled 2013-09-08 (×5): qty 1

## 2013-09-08 MED ORDER — ATORVASTATIN CALCIUM 40 MG PO TABS
40.0000 mg | ORAL_TABLET | Freq: Every evening | ORAL | Status: DC
  Administered 2013-09-08 – 2013-09-09 (×2): 40 mg via ORAL
  Filled 2013-09-08 (×3): qty 1

## 2013-09-08 MED ORDER — ZOLPIDEM TARTRATE 10 MG PO TABS: 10.0000 mg | ORAL_TABLET | Freq: Every day | ORAL | Status: DC

## 2013-09-08 MED ORDER — ONDANSETRON HCL 4 MG PO TABS: 4.0000 mg | ORAL_TABLET | Freq: Four times a day (QID) | ORAL | Status: DC | PRN

## 2013-09-08 MED ORDER — BUDESONIDE-FORMOTEROL FUMARATE 160-4.5 MCG/ACT IN AERO
2.0000 | INHALATION_SPRAY | Freq: Two times a day (BID) | RESPIRATORY_TRACT | Status: DC
  Administered 2013-09-08 – 2013-09-10 (×3): 2 via RESPIRATORY_TRACT
  Filled 2013-09-08 (×2): qty 6

## 2013-09-08 MED ORDER — SODIUM CHLORIDE 0.9 % IJ SOLN: 3.0000 mL | Freq: Two times a day (BID) | INTRAMUSCULAR | Status: DC

## 2013-09-08 MED ORDER — IPRATROPIUM BROMIDE 0.02 % IN SOLN
0.5000 mg | Freq: Four times a day (QID) | RESPIRATORY_TRACT | Status: DC
  Administered 2013-09-08 – 2013-09-09 (×7): 0.5 mg via RESPIRATORY_TRACT
  Filled 2013-09-08 (×8): qty 2.5

## 2013-09-08 MED ORDER — ZOLPIDEM TARTRATE 5 MG PO TABS
5.0000 mg | ORAL_TABLET | Freq: Every day | ORAL | Status: DC
  Administered 2013-09-08 – 2013-09-09 (×3): 5 mg via ORAL
  Filled 2013-09-08 (×3): qty 1

## 2013-09-08 MED ORDER — SODIUM CHLORIDE 0.9 % IV SOLN: 500.0000 mL | Freq: Once | INTRAVENOUS | Status: DC

## 2013-09-08 MED ORDER — GUAIFENESIN ER 600 MG PO TB12
600.0000 mg | ORAL_TABLET | Freq: Two times a day (BID) | ORAL | Status: DC
  Administered 2013-09-08 – 2013-09-10 (×4): 600 mg via ORAL
  Filled 2013-09-08 (×6): qty 1

## 2013-09-08 MED ORDER — ONDANSETRON HCL 4 MG/2ML IJ SOLN: 4.0000 mg | Freq: Four times a day (QID) | INTRAMUSCULAR | Status: DC | PRN

## 2013-09-08 NOTE — Progress Notes (Addendum)
[facility] PROGRESS NOTE  [name] XBJ:[id number] DOB: January 21, 1941 DOA: 09/07/2013 PCP: [name], MD Brief Narrative: Patient is a 72 y/o with h/o COPD on home oxygen, prior MI, DM, HTN, and h/o AVM @ upper GI tract followed by [name].  Presented to the ED with CC of light headedness and GI bleed. Was found to have blood loss anemia. Dr. Christella Hartigan (on call for [name]) was consulted by ED physician and patient was admitted to stepdown where he was transfused 2 units of PRBC's. Awaiting GI evaluation and recommendations today.  Assessment/Plan: 1 GI bleed  - Will defer further evaluation and recommendations to GI doctors - monitor cbc, no active bleeding reported to me by patient or nursing this am.  2 HYPERTENSION  -Currently on Metoprolol, would not be too aggressive controlling BP's given presenting complaint requiring transfusion.  3 DM  - SSI,  - hold metformin - continue to monitor blood sugars. Blood sugars relatively well controlled.  4 COPD (chronic obstructive pulmonary disease)  - xopenex, atrovent, supplemental oxygen  5 Chronic hypoxemic respiratory failure - continue home oxygen. Stable and at baseline  6 Blood loss anemia - pt s/p 2 units of PRBC. No active bleeding reported. Hemoglobin at 9.1. Will check cbc later this evening and next am.  7 AVM (arteriovenous malformation) of stomach, acquired with hemorrhage  - GI evaluation Pending. ED physician discussed case with Dr. Christella Hartigan who is on call for [name] according to ED physician's note.  8 Chest pain on exertion  - likely due to demand, resolved currently and cardiac enzymes negative  Code Status: full Family Communication: No family at bedside. Disposition Plan: Pending further work up and recommendations by specialist involved   Consultants:  GI: Dr. Christella Hartigan  Procedures:  none  Antibiotics:  None  HPI/Subjective: Patient would like to know what the plan is from a GI perspective  including when he can eat.  Otherwise no new complaints. Feels slightly better after blood transfusion.  Objective: Filed Vitals:   09/08/13 0800  BP: 146/82  Pulse: 86  Temp:   Resp: 18    Intake/Output Summary (Last 24 hours) at 09/08/13 1016 Last data filed at 09/08/13 0900  Gross per 24 hour  Intake   1005 ml  Output    750 ml  Net    255 ml   Filed Weights   09/08/13 0055  Weight: 120.9 kg (266 lb 8.6 oz)    Exam:   General:  Pt in NAD, Alert and Awake  Cardiovascular: RRR, no MRG  Respiratory: CTA BL, no wheezes, prolonged expiratory phase, barrel chest  Abdomen: soft, obese, NT  Musculoskeletal: no cyanosis or clubbing   Data Reviewed: Basic Metabolic Panel:  Recent Labs Lab 09/07/13 2020 09/08/13 0811  NA 132* 140  K 4.2 4.0  CL 96 103  CO2 25 27  GLUCOSE 219* 166*  BUN 32* 26*  CREATININE 0.91 0.94  CALCIUM 8.9 8.8  MG  --  2.0  PHOS  --  2.4   Liver Function Tests:  Recent Labs Lab 09/07/13 2020 09/08/13 0811  AST 13 10  ALT 9 6  ALKPHOS 108 93  BILITOT 0.3 0.4  PROT 6.9 6.4  ALBUMIN 3.3* 3.2*   No results found for this basename: LIPASE, AMYLASE,  in the last 168 hours No results found for this basename: AMMONIA,  in the last 168 hours CBC:  Recent Labs Lab 09/07/13 2020 09/08/13 0811  WBC 17.2* 8.7  HGB 6.7* 9.1*  HCT 23.3* 29.7*  MCV 82.3 82.3  PLT 400 248   Cardiac Enzymes:  Recent Labs Lab 09/07/13 2020 09/08/13 0811  TROPONINI <0.30 <0.30   BNP (last 3 results)  Recent Labs  05/14/13 2055 08/19/13 1531 09/07/13 2020  PROBNP 327.8* 596.9* 293.6*   CBG:  Recent Labs Lab 09/08/13 0141 09/08/13 0359 09/08/13 0804  GLUCAP 183* 158* 158*    Recent Results (from the past 240 hour(s))  MRSA PCR SCREENING     Status: None   Collection Time    09/08/13 12:47 AM      Result Value Range Status   MRSA by PCR NEGATIVE  NEGATIVE Final   Comment:            The GeneXpert MRSA Assay (FDA     approved  for NASAL specimens     only), is one component of a     comprehensive MRSA colonization     surveillance program. It is not     intended to diagnose MRSA     infection nor to guide or     monitor treatment for     MRSA infections.     Studies: Dg Chest Port 1 View  09/07/2013   CLINICAL DATA:  Shortness of breath and chest pain  EXAM: PORTABLE CHEST - 1 VIEW  COMPARISON:  08/19/2013  FINDINGS: Mild cardiac enlargement. Patient is status post median sternotomy. Vascular pattern is normal. Hyperinflation consistent with COPD with stable mild bibasilar scarring.  IMPRESSION: Stable chronic findings with no acute process.   Electronically Signed   By: Esperanza Heir M.D.   On: 09/07/2013 21:00    Scheduled Meds: . sodium chloride  500 mL Intravenous Once  . atorvastatin  40 mg Oral QPM  . budesonide-formoterol  2 puff Inhalation BID  . citalopram  40 mg Oral Daily  . docusate sodium  100 mg Oral BID  . folic acid  1 mg Oral BID  . guaiFENesin  600 mg Oral BID  . insulin aspart  0-9 Units Subcutaneous Q4H  . ipratropium  0.5 mg Nebulization Q6H  . metoprolol tartrate  25 mg Oral BID  . pantoprazole (PROTONIX) IV  40 mg Intravenous Q12H  . sodium chloride  3 mL Intravenous Q12H  . zolpidem  5 mg Oral QHS   Continuous Infusions: . sodium chloride 75 mL (09/08/13 1610)    Active Problems:   HYPERTENSION   DM   COPD (chronic obstructive pulmonary disease)   Chronic hypoxemic respiratory failure   Blood loss anemia   AVM (arteriovenous malformation) of stomach, acquired with hemorrhage   GI bleed   Chest pain on exertion    Time spent: > 40 minutes    [name]  [facility] Pager [phone]. If 7PM-7AM, please contact night-coverage at [email or web address], password [facility] 09/08/2013, 10:16 AM  LOS: 1 day     Addendum: Received request to transition patient to floor from Stepdown to floor at request of charge nurse.  Labs reviewed as well as vitals. Vital signs have  been stable. Repeat hemoglobin down from 9.1 to 8.1 upon review of lab. Will order 1 more units of PRBC be transfused given that he still had a bloody bowel movement today and his cardiac history.

## 2013-09-08 NOTE — Progress Notes (Signed)
Report called and given to Elpidio Galea 4th floor. All questions answered to her satisfaction. Vwilliams,rn.
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
## 2013-09-08 NOTE — Progress Notes (Signed)
Call received from [facility] in lab to say that blood drawn for cbc clotted and asked if this rn would place another order for labs to be collected. Done. Vwilliams, rn.

## 2013-09-09 ENCOUNTER — Encounter (HOSPITAL_COMMUNITY)
Admission: EM | Disposition: A | Discharge status: Home Health | Payer: Self-pay | Source: Home / Self Care | Attending: Internal Medicine

## 2013-09-09 ENCOUNTER — Encounter (HOSPITAL_COMMUNITY): Payer: Self-pay | Admitting: *Deleted

## 2013-09-09 HISTORY — PX: ESOPHAGOGASTRODUODENOSCOPY: SHX5428

## 2013-09-09 LAB — CBC
HCT: 29.1 % — ABNORMAL LOW (ref 39.0–52.0)
HCT: 28.3 % — ABNORMAL LOW (ref 39.0–52.0)
Hemoglobin: 9.1 g/dL — ABNORMAL LOW (ref 13.0–17.0)
Hemoglobin: 8.8 g/dL — ABNORMAL LOW (ref 13.0–17.0)
MCH: 25.4 pg — ABNORMAL LOW (ref 26.0–34.0)
MCHC: 31.3 g/dL (ref 30.0–36.0)
MCV: 82.7 fL (ref 78.0–100.0)
MCV: 81.6 fL (ref 78.0–100.0)
RBC: 3.52 MIL/uL — ABNORMAL LOW (ref 4.22–5.81)
RBC: 3.47 MIL/uL — ABNORMAL LOW (ref 4.22–5.81)
RDW: 16.4 % — ABNORMAL HIGH (ref 11.5–15.5)
WBC: 8.2 10*3/uL (ref 4.0–10.5)
WBC: 8 10*3/uL (ref 4.0–10.5)

## 2013-09-09 LAB — COMPREHENSIVE METABOLIC PANEL
ALT: 8 U/L (ref 0–53)
AST: 12 U/L (ref 0–37)
Albumin: 2.9 g/dL — ABNORMAL LOW (ref 3.5–5.2)
Alkaline Phosphatase: 93 U/L (ref 39–117)
BUN: 16 mg/dL (ref 6–23)
CO2: 25 mEq/L (ref 19–32)
Chloride: 106 mEq/L (ref 96–112)
GFR calc Af Amer: >90 mL/min (ref >90)
GFR calc non Af Amer: 83 mL/min — ABNORMAL LOW (ref >90)
Potassium: 4.1 mEq/L (ref 3.5–5.1)
Sodium: 139 mEq/L (ref 135–145)
Total Bilirubin: 0.3 mg/dL (ref 0.3–1.2)

## 2013-09-09 LAB — GLUCOSE, CAPILLARY
Glucose-Capillary: 166 mg/dL — ABNORMAL HIGH (ref 70–99)
Glucose-Capillary: 167 mg/dL — ABNORMAL HIGH (ref 70–99)
Glucose-Capillary: 240 mg/dL — ABNORMAL HIGH (ref 70–99)

## 2013-09-09 SURGERY — EGD (ESOPHAGOGASTRODUODENOSCOPY)
Anesthesia: Moderate Sedation

## 2013-09-09 MED ORDER — MIDAZOLAM HCL 10 MG/2ML IJ SOLN
INTRAMUSCULAR | Status: DC | PRN
  Administered 2013-09-09 (×4): 2 mg via INTRAVENOUS

## 2013-09-09 MED ORDER — BUTAMBEN-TETRACAINE-BENZOCAINE 2-2-14 % EX AERO
INHALATION_SPRAY | CUTANEOUS | Status: DC | PRN
  Administered 2013-09-09: 2 via TOPICAL

## 2013-09-09 MED ORDER — FENTANYL CITRATE 0.05 MG/ML IJ SOLN
INTRAMUSCULAR | Status: AC
  Filled 2013-09-09: qty 2

## 2013-09-09 MED ORDER — FENTANYL CITRATE 0.05 MG/ML IJ SOLN
INTRAMUSCULAR | Status: DC | PRN
  Administered 2013-09-09 (×2): 25 ug via INTRAVENOUS

## 2013-09-09 MED ORDER — SODIUM CHLORIDE 0.9 % IV SOLN: INTRAVENOUS | Status: DC

## 2013-09-09 MED ORDER — LORAZEPAM 2 MG/ML IJ SOLN
INTRAMUSCULAR | Status: AC
  Filled 2013-09-09: qty 1

## 2013-09-09 MED ORDER — MIDAZOLAM HCL 10 MG/2ML IJ SOLN
INTRAMUSCULAR | Status: AC
  Filled 2013-09-09: qty 2

## 2013-09-09 MED ORDER — LORAZEPAM 2 MG/ML IJ SOLN
0.5000 mg | Freq: Once | INTRAMUSCULAR | Status: AC
  Administered 2013-09-09: 02:00:00 0.5 mg via INTRAVENOUS

## 2013-09-09 NOTE — Op Note (Signed)
[facility] [address] [place], [address]   OPERATIVE PROCEDURE REPORT  PATIENT: [name], [name]  MR#: [id number] BIRTHDATE: 04-06-1941  GENDER: Male ENDOSCOPIST: Jeani Hawking, MD ASSISTANT:   Karie Soda, technician and Nada Libman, RN PROCEDURE DATE: 09/09/2013 PROCEDURE:   EGD w/ control of bleeding ASA CLASS:   Class III INDICATIONS:Anemia. MEDICATIONS: Versed 5 mg IV and Fentanyl 50 mcg IV TOPICAL ANESTHETIC:   Cetacaine Spray  DESCRIPTION OF PROCEDURE:   After the risks benefits and alternatives of the procedure were thoroughly explained, informed consent was obtained.  The PENTAX GASTOROSCOPE [id number]  endoscope was introduced through the mouth  and advanced to the second portion of the duodenum Without limitations.      The instrument was slowly withdrawn as the mucosa was fully examined.      FINDINGS: The hiatal hernia was again identified.  In the proximal gastric body multiple polyps were identified.  No overt bleeding, but there was some friability to the more distal polyps.  The distal polyps along the lesser curvature of the stomach were removed with a hot snare.  A total of seven polyps were removed. Removal of two polyps resuted in a minor post polypectomy bleed.  A total of 5 hemoclips were deployed, which arrested the bleeding with prolonged observation.  The focus during the next EGD will bhe the proximal greater curvature polyps displayed in image 10.  Six out of the seven polyps were retrieved with a [employer]. Retroflexed views revealed no abnormalities.     The scope was then withdrawn from the patient and the procedure terminated.  COMPLICATIONS: There were no complications. IMPRESSION: 1) Proximal gastric polyps. 2) Hiatal hernia.  RECOMMENDATIONS: 1) Repeat EGD on December 5th. 2) Follow HGB tonight. 3) If stable okay to D/C home tomorrow.  _______________________________ Rosalie DoctorJeani Hawking, MD 09/09/2013  12:45 PM

## 2013-09-09 NOTE — Interval H&P Note (Signed)
History and Physical Interval Note:  09/09/2013 12:03 PM  [name]  has presented today for surgery, with the diagnosis of recurrent UGI bleed and anemia  The various methods of treatment have been discussed with the patient and family. After consideration of risks, benefits and other options for treatment, the patient has consented to  Procedure(s): ESOPHAGOGASTRODUODENOSCOPY (EGD) (N/A) as a surgical intervention .  The patient's history has been reviewed, patient examined, no change in status, stable for surgery.  I have reviewed the patient's chart and labs.  Questions were answered to the patient's satisfaction.     Selyna Klahn D

## 2013-09-09 NOTE — Telephone Encounter (Signed)
Noted, he is now in the hospital

## 2013-09-09 NOTE — Progress Notes (Signed)
[facility] PROGRESS NOTE  HANIF RADIN ZOX:[id number] DOB: 01/04/1941 DOA: 09/07/2013 PCP: [name], MD Brief Narrative: Patient is a 72 y/o WM PMHx COPD on home oxygen, Hx MI, DM, HTN, and 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.  Presented to the ED with C/O of light headedness and GI bleed. Was found to have blood loss anemia. Dr. Christella Hartigan (on call for [name]) was consulted by ED physician and patient was admitted to stepdown where he was transfused 2 units of PRBC's. Per Dr. Rob Bunting (gastroenterology) note from 09/08/2013 patient will have EGD performed 11/24. TODAY patient states feeling much better since the transfusion. States on home O2 currently negative SOB, CP. States would like to know exactly when his procedure will go today. States also would like some medication that he had last night unsure the name however for his anxiety and pain. Also states had BM this morning and there was no blood or discoloration     Assessment/Plan: GI bleed  - Patient will have EGD performed today by [facility]  -Continue to monitor CBC, current hemoglobin=8.8  HYPERTENSION  -Currently on Metoprolol, would not be too aggressive controlling BP's given presenting complaint requiring transfusion.  DM Type 2 - SSI,  - hold metformin - continue to monitor blood sugars. Blood sugars relatively well controlled.  COPD (chronic obstructive pulmonary disease)  - xopenex, atrovent, supplemental oxygen  Chronic hypoxemic respiratory failure - continue home oxygen. Stable and at baseline  Blood loss anemia  - pt s/p 2 units of PRBC. No active bleeding reported. Hemoglobin at 8.8 - Will check cbc later this evening postprocedure   AVM (arteriovenous malformation) of stomach, acquired with hemorrhage  - GI evaluation Pending. ED physician discussed case with Dr. Christella Hartigan who is on call for [name] according to ED physician's note.  Chest pain on exertion  - likely due to demand,  resolved currently and cardiac enzymes negative  Anxiety -Counseled patient most likely would not receive any doses of Xanax or pain medication at this time secondary to gastroenterology having to use medication for sedation for their procedure.   Code Status: full Family Communication: No family at bedside. Disposition Plan: Pending further work up and recommendations by specialist involved   Consultants:  GI: Dr. Christella Hartigan  Procedures:  none  Antibiotics:  None    Objective: Filed Vitals:   09/09/13 0427  BP: 146/84  Pulse: 82  Temp: 97.4 F (36.3 C)  Resp: 20    Intake/Output Summary (Last 24 hours) at 09/09/13 0901 Last data filed at 09/09/13 0700  Gross per 24 hour  Intake 1686.25 ml  Output    375 ml  Net 1311.25 ml   Filed Weights   09/08/13 0055 09/09/13 0427  Weight: 120.9 kg (266 lb 8.6 oz) 122 kg (268 lb 15.4 oz)    Exam:   General:  A./O. x4, NAD   Cardiovascular: RRR, no MRG, DP/PT pulse +1 bilateral  Respiratory: CTA BL, no wheezes, prolonged expiratory phase, barrel chest (on oxygen)  Abdomen: soft, obese, NT  Musculoskeletal: no cyanosis or clubbing   Data Reviewed: Basic Metabolic Panel:  Recent Labs Lab 09/07/13 2020 09/08/13 0811  NA 132* 140  K 4.2 4.0  CL 96 103  CO2 25 27  GLUCOSE 219* 166*  BUN 32* 26*  CREATININE 0.91 0.94  CALCIUM 8.9 8.8  MG  --  2.0  PHOS  --  2.4   Liver Function Tests:  Recent  Labs Lab 09/07/13 2020 09/08/13 0811  AST 13 10  ALT 9 6  ALKPHOS 108 93  BILITOT 0.3 0.4  PROT 6.9 6.4  ALBUMIN 3.3* 3.2*   No results found for this basename: LIPASE, AMYLASE,  in the last 168 hours No results found for this basename: AMMONIA,  in the last 168 hours CBC:  Recent Labs Lab 09/07/13 2020 09/08/13 0811 09/08/13 1616 09/09/13 0141 09/09/13 0450  WBC 17.2* 8.7 6.7 8.2 8.0  HGB 6.7* 9.1* 8.1* 9.1* 8.8*  HCT 23.3* 29.7* 26.3* 29.1* 28.3*  MCV 82.3 82.3 83.0 82.7 81.6  PLT 400 248 235  229 220   Cardiac Enzymes:  Recent Labs Lab 09/07/13 2020 09/08/13 0811 09/08/13 1511 09/08/13 2008  TROPONINI <0.30 <0.30 <0.30 <0.30   BNP (last 3 results)  Recent Labs  05/14/13 2055 08/19/13 1531 09/07/13 2020  PROBNP 327.8* 596.9* 293.6*   CBG:  Recent Labs Lab 09/08/13 1958 09/08/13 2134 09/09/13 0031 09/09/13 0425 09/09/13 0745  GLUCAP 199* 192* 240* 198* 167*    Recent Results (from the past 240 hour(s))  MRSA PCR SCREENING     Status: None   Collection Time    09/08/13 12:47 AM      Result Value Range Status   MRSA by PCR NEGATIVE  NEGATIVE Final   Comment:            The GeneXpert MRSA Assay (FDA     approved for NASAL specimens     only), is one component of a     comprehensive MRSA colonization     surveillance program. It is not     intended to diagnose MRSA     infection nor to guide or     monitor treatment for     MRSA infections.     Studies: Dg Chest Port 1 View  09/07/2013   CLINICAL DATA:  Shortness of breath and chest pain  EXAM: PORTABLE CHEST - 1 VIEW  COMPARISON:  08/19/2013  FINDINGS: Mild cardiac enlargement. Patient is status post median sternotomy. Vascular pattern is normal. Hyperinflation consistent with COPD with stable mild bibasilar scarring.  IMPRESSION: Stable chronic findings with no acute process.   Electronically Signed   By: Esperanza Heir M.D.   On: 09/07/2013 21:00    Scheduled Meds: . sodium chloride  500 mL Intravenous Once  . atorvastatin  40 mg Oral QPM  . budesonide-formoterol  2 puff Inhalation BID  . citalopram  40 mg Oral Daily  . docusate sodium  100 mg Oral BID  . folic acid  1 mg Oral BID  . guaiFENesin  600 mg Oral BID  . insulin aspart  0-9 Units Subcutaneous Q4H  . ipratropium  0.5 mg Nebulization Q6H  . LORazepam      . metoprolol tartrate  25 mg Oral BID  . pantoprazole  40 mg Oral BID  . sodium chloride  3 mL Intravenous Q12H  . zolpidem  5 mg Oral QHS   Continuous Infusions: . sodium  chloride 75 mL/hr at 09/08/13 2325  . sodium chloride      Active Problems:   HYPERTENSION   DM   COPD (chronic obstructive pulmonary disease)   Chronic hypoxemic respiratory failure   Blood loss anemia   AVM (arteriovenous malformation) of stomach, acquired with hemorrhage   GI bleed   Chest pain on exertion    Time spent: > 35 minutes    Eri Mcevers, J  [facility] Pager [phone]. If  7PM-7AM, please contact night-coverage at [email or web address], password [facility] 09/09/2013, 9:01 AM  LOS: 2 days

## 2013-09-09 NOTE — H&P (View-Only) (Signed)
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 Hung.  Presented to the ED with C/O of light headedness and GI bleed. Was found to have blood loss anemia. Dr. Jacobs (on call for Dr. Hung) was consulted by ED physician and patient was admitted to stepdown where he was transfused 2 units of PRBC's. Per Dr. Daniel Jacobs (gastroenterology) note from 09/08/2013 patient will have EGD performed 11/24. TODAY patient states feeling much better since the transfusion. States on home O2 currently negative SOB, CP. States would like to know exactly when his procedure will go today. States also would like some medication that he had last night unsure the name however for his anxiety and pain. Also states had BM this morning and there was no blood or discoloration     Assessment/Plan: GI bleed  - Patient will have EGD performed today by [facility]  -Continue to monitor CBC, current hemoglobin=8.8  HYPERTENSION  -Currently on Metoprolol, would not be too aggressive controlling BP's given presenting complaint requiring transfusion.  DM Type 2 - SSI,  - hold metformin - continue to monitor blood sugars. Blood sugars relatively well controlled.  COPD (chronic obstructive pulmonary disease)  - xopenex, atrovent, supplemental oxygen  Chronic hypoxemic respiratory failure - continue home oxygen. Stable and at baseline  Blood loss anemia  - pt s/p 2 units of PRBC. No active bleeding reported. Hemoglobin at 8.8 - Will check cbc later this evening postprocedure   AVM (arteriovenous malformation) of stomach, acquired with hemorrhage  - GI evaluation Pending. ED physician discussed case with Dr. Jacobs who is on call for Dr. Hung according to ED physician's note.  Chest pain on exertion  - likely due to demand,  resolved currently and cardiac enzymes negative  Anxiety -Counseled patient most likely would not receive any doses of Xanax or pain medication at this time secondary to gastroenterology having to use medication for sedation for their procedure.   Code Status: full Family Communication: No family at bedside. Disposition Plan: Pending further work up and recommendations by specialist involved   Consultants:  GI: Dr. Jacobs  Procedures:  none  Antibiotics:  None    Objective: Filed Vitals:   09/09/13 0427  BP: 146/84  Pulse: 82  Temp: 97.4 F (36.3 C)  Resp: 20    Intake/Output Summary (Last 24 hours) at 09/09/13 0901 Last data filed at 09/09/13 0700  Gross per 24 hour  Intake 1686.25 ml  Output    375 ml  Net 1311.25 ml   Filed Weights   09/08/13 0055 09/09/13 0427  Weight: 120.9 kg (266 lb 8.6 oz) 122 kg (268 lb 15.4 oz)    Exam:   General:  A./O. x4, NAD   Cardiovascular: RRR, no MRG, DP/PT pulse +1 bilateral  Respiratory: CTA BL, no wheezes, prolonged expiratory phase, barrel chest (on oxygen)  Abdomen: soft, obese, NT  Musculoskeletal: no cyanosis or clubbing   Data Reviewed: Basic Metabolic Panel:  Recent Labs Lab 09/07/13 2020 09/08/13 0811  NA 132* 140  K 4.2 4.0  CL 96 103  CO2 25 27  GLUCOSE 219* 166*  BUN 32* 26*  CREATININE 0.91 0.94  CALCIUM 8.9 8.8  MG  --  2.0  PHOS  --  2.4   Liver Function Tests:  Recent   Labs Lab 09/07/13 2020 09/08/13 0811  AST 13 10  ALT 9 6  ALKPHOS 108 93  BILITOT 0.3 0.4  PROT 6.9 6.4  ALBUMIN 3.3* 3.2*   No results found for this basename: LIPASE, AMYLASE,  in the last 168 hours No results found for this basename: AMMONIA,  in the last 168 hours CBC:  Recent Labs Lab 09/07/13 2020 09/08/13 0811 09/08/13 1616 09/09/13 0141 09/09/13 0450  WBC 17.2* 8.7 6.7 8.2 8.0  HGB 6.7* 9.1* 8.1* 9.1* 8.8*  HCT 23.3* 29.7* 26.3* 29.1* 28.3*  MCV 82.3 82.3 83.0 82.7 81.6  PLT 400 248 235  229 220   Cardiac Enzymes:  Recent Labs Lab 09/07/13 2020 09/08/13 0811 09/08/13 1511 09/08/13 2008  TROPONINI <0.30 <0.30 <0.30 <0.30   BNP (last 3 results)  Recent Labs  05/14/13 2055 08/19/13 1531 09/07/13 2020  PROBNP 327.8* 596.9* 293.6*   CBG:  Recent Labs Lab 09/08/13 1958 09/08/13 2134 09/09/13 0031 09/09/13 0425 09/09/13 0745  GLUCAP 199* 192* 240* 198* 167*    Recent Results (from the past 240 hour(s))  MRSA PCR SCREENING     Status: None   Collection Time    09/08/13 12:47 AM      Result Value Range Status   MRSA by PCR NEGATIVE  NEGATIVE Final   Comment:            The GeneXpert MRSA Assay (FDA     approved for NASAL specimens     only), is one component of a     comprehensive MRSA colonization     surveillance program. It is not     intended to diagnose MRSA     infection nor to guide or     monitor treatment for     MRSA infections.     Studies: Dg Chest Port 1 View  09/07/2013   CLINICAL DATA:  Shortness of breath and chest pain  EXAM: PORTABLE CHEST - 1 VIEW  COMPARISON:  08/19/2013  FINDINGS: Mild cardiac enlargement. Patient is status post median sternotomy. Vascular pattern is normal. Hyperinflation consistent with COPD with stable mild bibasilar scarring.  IMPRESSION: Stable chronic findings with no acute process.   Electronically Signed   By: Raymond  Rubner M.D.   On: 09/07/2013 21:00    Scheduled Meds: . sodium chloride  500 mL Intravenous Once  . atorvastatin  40 mg Oral QPM  . budesonide-formoterol  2 puff Inhalation BID  . citalopram  40 mg Oral Daily  . docusate sodium  100 mg Oral BID  . folic acid  1 mg Oral BID  . guaiFENesin  600 mg Oral BID  . insulin aspart  0-9 Units Subcutaneous Q4H  . ipratropium  0.5 mg Nebulization Q6H  . LORazepam      . metoprolol tartrate  25 mg Oral BID  . pantoprazole  40 mg Oral BID  . sodium chloride  3 mL Intravenous Q12H  . zolpidem  5 mg Oral QHS   Continuous Infusions: . sodium  chloride 75 mL/hr at 09/08/13 2325  . sodium chloride      Active Problems:   HYPERTENSION   DM   COPD (chronic obstructive pulmonary disease)   Chronic hypoxemic respiratory failure   Blood loss anemia   AVM (arteriovenous malformation) of stomach, acquired with hemorrhage   GI bleed   Chest pain on exertion    Time spent: > 35 minutes    WOODS, CURTIS, J  [facility] Pager [phone]. If   7PM-7AM, please contact night-coverage at [email or web address], password [email or web address] 09/09/2013, 9:01 AM  LOS: 2 days     

## 2013-09-09 NOTE — Progress Notes (Signed)
[facility]  Patient Status: Active (receiving services up to time of hospitalization)  [facility] is providing the following services: PT  If patient discharges after hours, please call [phone].   Lanae Crumbly 09/09/2013, 12:13 PM

## 2013-09-09 NOTE — Progress Notes (Signed)
Patient has been NPO since midnight.

## 2013-09-10 ENCOUNTER — Encounter (HOSPITAL_COMMUNITY): Payer: Self-pay | Admitting: Pharmacist

## 2013-09-10 ENCOUNTER — Encounter (HOSPITAL_COMMUNITY): Payer: Self-pay | Admitting: Gastroenterology

## 2013-09-10 DIAGNOSIS — Q459 Congenital malformation of digestive system, unspecified: Secondary | ICD-10-CM

## 2013-09-10 DIAGNOSIS — M6281 Muscle weakness (generalized): Secondary | ICD-10-CM

## 2013-09-10 DIAGNOSIS — K317 Polyp of stomach and duodenum: Secondary | ICD-10-CM | POA: Diagnosis present

## 2013-09-10 DIAGNOSIS — D500 Iron deficiency anemia secondary to blood loss (chronic): Secondary | ICD-10-CM

## 2013-09-10 DIAGNOSIS — IMO0001 Reserved for inherently not codable concepts without codable children: Secondary | ICD-10-CM

## 2013-09-10 DIAGNOSIS — D131 Benign neoplasm of stomach: Secondary | ICD-10-CM

## 2013-09-10 DIAGNOSIS — F411 Generalized anxiety disorder: Secondary | ICD-10-CM | POA: Diagnosis present

## 2013-09-10 DIAGNOSIS — R269 Unspecified abnormalities of gait and mobility: Secondary | ICD-10-CM

## 2013-09-10 LAB — CBC WITH DIFFERENTIAL/PLATELET
Eosinophils Absolute: 0.3 10*3/uL (ref 0.0–0.7)
HCT: 28.6 % — ABNORMAL LOW (ref 39.0–52.0)
Hemoglobin: 8.7 g/dL — ABNORMAL LOW (ref 13.0–17.0)
Lymphs Abs: 1 10*3/uL (ref 0.7–4.0)
MCH: 25.7 pg — ABNORMAL LOW (ref 26.0–34.0)
MCV: 84.4 fL (ref 78.0–100.0)
Monocytes Absolute: 0.8 10*3/uL (ref 0.1–1.0)
Monocytes Relative: 9 % (ref 3–12)
Neutro Abs: 7.3 10*3/uL (ref 1.7–7.7)
Neutrophils Relative %: 78 % — ABNORMAL HIGH (ref 43–77)
RBC: 3.39 MIL/uL — ABNORMAL LOW (ref 4.22–5.81)
RDW: 16.7 % — ABNORMAL HIGH (ref 11.5–15.5)

## 2013-09-10 LAB — COMPREHENSIVE METABOLIC PANEL
Alkaline Phosphatase: 97 U/L (ref 39–117)
BUN: 13 mg/dL (ref 6–23)
Chloride: 105 mEq/L (ref 96–112)
Creatinine, Ser: 0.86 mg/dL (ref 0.50–1.35)
GFR calc Af Amer: >90 mL/min (ref >90)
Glucose, Bld: 162 mg/dL — ABNORMAL HIGH (ref 70–99)
Potassium: 3.9 mEq/L (ref 3.5–5.1)
Sodium: 139 mEq/L (ref 135–145)
Total Bilirubin: 0.3 mg/dL (ref 0.3–1.2)
Total Protein: 6 g/dL (ref 6.0–8.3)

## 2013-09-10 LAB — GLUCOSE, CAPILLARY
Glucose-Capillary: 163 mg/dL — ABNORMAL HIGH (ref 70–99)
Glucose-Capillary: 197 mg/dL — ABNORMAL HIGH (ref 70–99)
Glucose-Capillary: 240 mg/dL — ABNORMAL HIGH (ref 70–99)

## 2013-09-10 LAB — MAGNESIUM: Magnesium: 2.2 mg/dL (ref 1.5–2.5)

## 2013-09-10 MED ORDER — IPRATROPIUM BROMIDE 0.02 % IN SOLN
0.5000 mg | Freq: Three times a day (TID) | RESPIRATORY_TRACT | Status: DC
  Administered 2013-09-10: 08:00:00 0.5 mg via RESPIRATORY_TRACT
  Filled 2013-09-10: qty 2.5

## 2013-09-10 NOTE — Progress Notes (Signed)
Inpatient Diabetes Program Recommendations  AACE/ADA: New Consensus Statement on Inpatient Glycemic Control (2013)  Target Ranges:  Prepandial:   less than 140 mg/dL      Peak postprandial:   less than 180 mg/dL (1-2 hours)      Critically ill patients:  140 - 180 mg/dL     Results for Howard, [name] (MRN [id number]) as of 09/10/2013 10:30  Ref. Range 09/09/2013 00:31 09/09/2013 04:25 09/09/2013 07:45 09/09/2013 13:49 09/09/2013 17:07 09/09/2013 20:16  Glucose-Capillary Latest Range: 70-99 mg/dL 130 (H) 865 (H) 784 (H) 166 (H) 229 (H) 240 (H)    Results for Howard, [name] (MRN [id number]) as of 09/10/2013 10:30  Ref. Range 09/10/2013 00:02 09/10/2013 04:25 09/10/2013 08:13  Glucose-Capillary Latest Range: 70-99 mg/dL 132 (H) 440 (H) 102 (H)    **MD- Please consider changing patient's in-hospital SSI regimen to Moderate scale tid ac + HS (currently on Sensitive Q4 hours)   Will follow. Ambrose Finland RN, MSN, CDE Diabetes Coordinator Inpatient Diabetes Program Team Pager: [phone] (8a-10p)

## 2013-09-10 NOTE — Care Management Note (Signed)
    Page 1 of 1   09/10/2013     11:21:03 AM   CARE MANAGEMENT NOTE 09/10/2013  Patient:  [name], [name]   Account Number:  [id number]  Date Initiated:  09/08/2013  Documentation initiated by:  [facility]  Subjective/Objective Assessment:   72 year old male admitted with GI bleed.     Action/Plan:   From home.   Anticipated DC Date:  09/10/2013   Anticipated DC Plan:  HOME W HOME HEALTH SERVICES      DC Planning Services  CM consult      Choice offered to / List presented to:  C-1 Patient        HH arranged  HH-2 PT  HH-4 NURSE'S AIDE      HH agency  [facility]   Status of service:  Completed, signed off Medicare Important Message given?  NA - LOS <3 / Initial given by admissions (If response is "NO", the following Medicare IM given date fields will be blank) Date Medicare IM given:   Date Additional Medicare IM given:    Discharge Disposition:  HOME W HOME HEALTH SERVICES  Per UR Regulation:  Reviewed for med. necessity/level of care/duration of stay  If discussed at Long Length of Stay Meetings, dates discussed:    Comments:  09/10/13 [facility] RN,BSN NCM [phone] ACTIVE W/[facility].PATIENT WANTS A SPECIFIC HHPT-NAMED STACEY,SPOKE TO [facility] REP STEPHANIE MADE AWARE OF REQUEST.NO FURTHER D/C NEEDS OR ORDERS.

## 2013-09-10 NOTE — Progress Notes (Signed)
Given COPD GOLD card # [id number]/

## 2013-09-10 NOTE — Progress Notes (Signed)
Received referral for [facility] Care Management services on behalf of COPD GOLD program. Patient however not eligible for [facility] Care Management at this time due to payor not being in network with [facility] Care Management. Patient already set up with home health services however. Left voicemail for inpatient RNCM to make aware of above.  Raiford Noble, MSN-Ed, RN, BSN- [facility] [phone]

## 2013-09-10 NOTE — Discharge Summary (Signed)
Physician Discharge Summary  [name]:[id number] DOB: February 06, 1941 DOA: 09/07/2013  PCP: [name], MD  Admit date: 09/07/2013 Discharge date: 09/10/2013  Time spent:40 minutes  Recommendations for Outpatient Follow-up:   GI bleed  - EGD performed today by [facility] Dr Jeani Hawking -Continue to monitor CBC, current hemoglobin=8.7  Gastric polyps -Spoke with Dr. Jeani Hawking (gastroenterology) stated the patient is to see him on 5 December as an outpatient for additional EGD. Patient to contact office for further directions   HYPERTENSION  -Currently on Metoprolol, would not be too aggressive controlling BP's given presenting complaint requiring transfusion.   DM Type 2  - SSI,  - Restart metformin prior D/C   COPD (chronic obstructive pulmonary disease)  -  D/C on home dose xopenex, atrovent, supplemental oxygen   Chronic hypoxemic respiratory failure  - continue home oxygen. Stable and at baseline   Blood loss anemia  - pt s/p 2 units of PRBC. No active bleeding reported. Hemoglobin at 8.7  - EGD performed see below for results;   Chest pain on exertion  - cardiac enzymes negative   Anxiety  -D/C on home meds    Discharge Diagnoses:  Active Problems:   HYPERLIPIDEMIA   HYPERTENSION   MYOCARDIAL INFARCTION   DM   COPD (chronic obstructive pulmonary disease)   Chronic GI bleeding   Chronic hypoxemic respiratory failure   Blood loss anemia   AVM (arteriovenous malformation) of stomach, acquired with hemorrhage   GI bleed   Discharge Condition: Stable  Diet recommendation: Heart healthy  Filed Weights   09/08/13 0055 09/09/13 0427  Weight: 120.9 kg (266 lb 8.6 oz) 122 kg (268 lb 15.4 oz)    History of present illness:  72 y/o WM PMHx COPD on home oxygen, Hx MI, DM, HTN, and 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. Presented to the ED with C/O of light headedness and GI bleed. Was found to have blood loss anemia. Dr. Christella Hartigan (on call for  [name]) was consulted by ED physician and patient was admitted to stepdown where he was transfused 2 units of PRBC's. Per Dr. Rob Bunting (gastroenterology) note from 09/08/2013 patient will have EGD performed 11/24. 09/09/2013 patient states feeling much better since the transfusion. States on home O2 currently negative SOB, CP. States would like to know exactly when his procedure will go today. States also would like some medication that he had last night unsure the name however for his anxiety and pain. Also states had BM this morning and there was no blood or discoloration. TODAY S/P EGD. States negative CP, negative SOB, negative abdominal pain. States unsure if he had BM last night.     Procedures:  EGD 09/09/2013  - proximal gastric body multiple polyps were identified. No overt bleeding, but there was some friability to the more distal polyps. - Distal polyps along the lesser curvature of the stomach were removed with a hot snare. A total of seven polyps were removed. Removal of two polyps resuted in a minor post polypectomy bleed.  -Total of 5 hemoclips were deployed, which arrested the bleeding with prolonged observation. - Six out of the seven polyps were retrieved with a Lucina Mellow Net.   -Hiatal Hernia -Repeat EGD on December 5th.     Consultations:  Gastroenterology (Dr. Jeani Hawking)  Discharge Exam: Filed Vitals:   09/09/13 1936 09/09/13 2019 09/10/13 0426 09/10/13 0734  BP:  149/61 143/83   Pulse:  99 74  Temp:  98.1 F (36.7 C) 98.2 F (36.8 C)   TempSrc:  Oral Oral   Resp:  16 16   Height:      Weight:      SpO2: 100% 98% 96% 96%    General: A./O. x4, NAD Cardiovascular: Regular rhythm and rate, negative murmurs rubs gallops, DP/PT +1 Respiratory: Clear to auscultation bilateral Abdomen; nontender obese, plus bowel sounds  Discharge Instructions   Future Appointments Provider Department Dept Phone   10/21/2013 11:00 AM Carma Lair Nickel, NP [facility] [phone]       Medication List    ASK your doctor about these medications       albuterol 108 (90 BASE) MCG/ACT inhaler  Commonly known as:  PROVENTIL HFA;VENTOLIN HFA  Inhale 2 puffs into the lungs every 6 (six) hours as needed for wheezing.     ALPRAZolam 0.25 MG tablet  Commonly known as:  XANAX  Take 0.25 mg by mouth 3 (three) times daily as needed for anxiety.     atorvastatin 40 MG tablet  Commonly known as:  LIPITOR  Take 40 mg by mouth every evening.     budesonide-formoterol 160-4.5 MCG/ACT inhaler  Commonly known as:  SYMBICORT  Inhale 2 puffs into the lungs 2 (two) times daily.     citalopram 40 MG tablet  Commonly known as:  CELEXA  Take 40-80 mg by mouth daily.     clobetasol ointment 0.05 %  Commonly known as:  TEMOVATE  Apply 1 application topically 2 (two) times daily as needed (rash).     folic acid 1 MG tablet  Commonly known as:  FOLVITE  Take 1 mg by mouth 2 (two) times daily.     HYDROcodone-acetaminophen 5-325 MG per tablet  Commonly known as:  NORCO/VICODIN  Take 1 tablet by mouth every 6 (six) hours as needed for moderate pain.     iron polysaccharides 150 MG capsule  Commonly known as:  NIFEREX  Take 150 mg by mouth 2 (two) times daily.     metFORMIN 500 MG tablet  Commonly known as:  GLUCOPHAGE  Take 500 mg by mouth 2 (two) times daily with a meal.     metoprolol tartrate 25 MG tablet  Commonly known as:  LOPRESSOR  Take 25 mg by mouth 2 (two) times daily.     pantoprazole 40 MG tablet  Commonly known as:  PROTONIX  Take 40 mg by mouth 2 (two) times daily.     tiotropium 18 MCG inhalation capsule  Commonly known as:  SPIRIVA  Place 18 mcg into inhaler and inhale daily.     TYLENOL COLD HEAD CONGESTION 10-5-325 MG Tabs  Generic drug:  DM-Phenylephrine-Acetaminophen  Take 1 tablet by mouth as needed (congestion).     zolpidem 10 MG tablet  Commonly known as:  AMBIEN  Take 10 mg by mouth at bedtime.        Allergies  Allergen Reactions  . Penicillins Anaphylaxis and Hives  . Demerol [Meperidine] Other (See Comments)    hallucinations  . Levaquin [Levofloxacin In D5w]     Nausea and diarrhea  . Morphine And Related Nausea Only      The results of significant diagnostics from this hospitalization (including imaging, microbiology, ancillary and laboratory) are listed below for reference.    Significant Diagnostic Studies: Dg Chest 2 View  08/19/2013   CLINICAL DATA:  Chest pain.  EXAM: CHEST  2 VIEW  COMPARISON:  05/14/2013.  FINDINGS: Of the heart is upper limits of normal and stable. The mediastinal and hilar contours are unchanged. 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 Stable basilar scarring changes and probable emphysematous changes. No acute with pulmonary findings. No pleural effusion or pneumothorax. The bony thorax is intact.  IMPRESSION: Stable emphysematous and pulmonary scarring changes. No acute pulmonary findings.   Electronically Signed   By: Loralie Champagne M.D.   On: 08/19/2013 18:37   Dg Chest Port 1 View  09/07/2013   CLINICAL DATA:  Shortness of breath and chest pain  EXAM: PORTABLE CHEST - 1 VIEW  COMPARISON:  08/19/2013  FINDINGS: Mild cardiac enlargement. Patient is status post median sternotomy. Vascular pattern is normal. Hyperinflation consistent with COPD with stable mild bibasilar scarring.  IMPRESSION: Stable chronic findings with no acute process.   Electronically Signed   By: Esperanza Heir M.D.   On: 09/07/2013 21:00    Microbiology: Recent Results (from the past 240 hour(s))  MRSA PCR SCREENING     Status: None   Collection Time    09/08/13 12:47 AM      Result Value Range Status   MRSA by PCR NEGATIVE  NEGATIVE Final   Comment:            The GeneXpert MRSA Assay (FDA     approved for NASAL specimens     only), is one component of a     comprehensive MRSA colonization     surveillance program. It is not     intended to diagnose MRSA      infection nor to guide or     monitor treatment for     MRSA infections.     Labs: Basic Metabolic Panel:  Recent Labs Lab 09/07/13 2020 09/08/13 0811 09/09/13 1110 09/10/13 0340  NA 132* 140 139 139  K 4.2 4.0 4.1 3.9  CL 96 103 106 105  CO2 25 27 25 25   GLUCOSE 219* 166* 169* 162*  BUN 32* 26* 16 13  CREATININE 0.91 0.94 0.91 0.86  CALCIUM 8.9 8.8 8.0* 7.9*  MG  --  2.0  --  2.2  PHOS  --  2.4  --   --    Liver Function Tests:  Recent Labs Lab 09/07/13 2020 09/08/13 0811 09/09/13 1110 09/10/13 0340  AST 13 10 12 14   ALT 9 6 8 9   ALKPHOS 108 93 93 97  BILITOT 0.3 0.4 0.3 0.3  PROT 6.9 6.4 6.1 6.0  ALBUMIN 3.3* 3.2* 2.9* 3.0*   No results found for this basename: LIPASE, AMYLASE,  in the last 168 hours No results found for this basename: AMMONIA,  in the last 168 hours CBC:  Recent Labs Lab 09/08/13 0811 09/08/13 1616 09/09/13 0141 09/09/13 0450 09/10/13 0340  WBC 8.7 6.7 8.2 8.0 9.4  NEUTROABS  --   --   --   --  7.3  HGB 9.1* 8.1* 9.1* 8.8* 8.7*  HCT 29.7* 26.3* 29.1* 28.3* 28.6*  MCV 82.3 83.0 82.7 81.6 84.4  PLT 248 235 229 220 227   Cardiac Enzymes:  Recent Labs Lab 09/07/13 2020 09/08/13 0811 09/08/13 1511 09/08/13 2008  TROPONINI <0.30 <0.30 <0.30 <0.30   BNP: BNP (last 3 results)  Recent Labs  05/14/13 2055 08/19/13 1531 09/07/13 2020  PROBNP 327.8* 596.9* 293.6*   CBG:  Recent Labs Lab 09/09/13 1349 09/09/13 1707 09/09/13 2016 09/10/13 0002 09/10/13 0425  GLUCAP 166* 229* 240* 197* 163*       Signed:  Carolyne Littles, MD [facility] [phone] pager

## 2013-09-11 LAB — TYPE AND SCREEN
Antibody Screen: NEGATIVE
Unit division: 0
Unit division: 0
Unit division: 0

## 2013-09-18 ENCOUNTER — Telehealth: Payer: Self-pay | Admitting: Family Medicine

## 2013-09-18 MED ORDER — HYDROCODONE-ACETAMINOPHEN 5-325 MG PO TABS: 1.0000 | ORAL_TABLET | Freq: Four times a day (QID) | ORAL | Status: DC | PRN

## 2013-09-18 NOTE — Telephone Encounter (Signed)
Called and informed pt's wife that RX ready to be picked up by pt.

## 2013-09-18 NOTE — Telephone Encounter (Signed)
Pt's wife came to front desk to request hydrocodone refill.  Ricki Miller at the front desk called the patient to clarify exactly what he was requesting after seeing his last hydrocodone refill was on 09/03/13.  Phone call was transferred to me after pt started yelling at [facility] and would not let her speak.  The patient was originally getting Hydrocodone #90 monthly from Dr. Dayton Martes but only got #30 on 09/03/13 when refilled by Nicki Reaper, NP.  Pt was yelling and talking over me during the entire conversation.  He was upset that he did not get 90 pills (states he only has 1 pill left) and was mad at the fact that he has to come to our office and sign for the medication.  I informed him that last time, we allowed his wife to pick up his RX and told her that he would have to come the next time.  He states that he had a colonoscopy and that he is "recovering from surgery" and cannot come to our office.  I informed pt that I would ask another provider to look at his chart and that if they agreed to give him a prescription for the remaining 60 pills, he would have to come and sign for the prescription.  Pt said he would come to pick up RX but that he "wouldn't be in a good mood".  I informed him that if he came in our office yelling at our staff and causing a scene, we would call the police.  Routing telephone note to Dr. Milinda Antis since Nicki Reaper, NP is on vacation and Dr. Dayton Martes is on maternity leave.

## 2013-09-19 ENCOUNTER — Encounter: Payer: Self-pay | Admitting: Radiology

## 2013-09-20 ENCOUNTER — Encounter: Payer: Self-pay | Admitting: Family Medicine

## 2013-09-20 ENCOUNTER — Other Ambulatory Visit: Payer: Self-pay | Admitting: *Deleted

## 2013-09-20 MED ORDER — PANTOPRAZOLE SODIUM 40 MG PO TBEC: 40.0000 mg | DELAYED_RELEASE_TABLET | Freq: Two times a day (BID) | ORAL | Status: DC

## 2013-09-20 NOTE — Telephone Encounter (Signed)
Rx was sent to pharmacy electronically. 

## 2013-09-23 ENCOUNTER — Encounter: Payer: Self-pay | Admitting: Radiology

## 2013-09-24 ENCOUNTER — Ambulatory Visit: Payer: Medicare Other | Admitting: Family Medicine

## 2013-09-24 DIAGNOSIS — Z0289 Encounter for other administrative examinations: Secondary | ICD-10-CM

## 2013-09-27 ENCOUNTER — Encounter (HOSPITAL_COMMUNITY)
Admission: RE | Disposition: A | Discharge status: Home / Self Care | Payer: Self-pay | Source: Ambulatory Visit | Attending: Gastroenterology

## 2013-09-27 ENCOUNTER — Encounter (HOSPITAL_COMMUNITY): Payer: Self-pay | Admitting: *Deleted

## 2013-09-27 ENCOUNTER — Ambulatory Visit (HOSPITAL_COMMUNITY)
Admission: RE | Admit: 2013-09-27 | Discharge: 2013-09-27 | Disposition: A | Discharge status: Home / Self Care | Payer: Medicare Other | Source: Ambulatory Visit | Attending: Gastroenterology | Admitting: Gastroenterology

## 2013-09-27 DIAGNOSIS — D131 Benign neoplasm of stomach: Secondary | ICD-10-CM | POA: Insufficient documentation

## 2013-09-27 DIAGNOSIS — D649 Anemia, unspecified: Secondary | ICD-10-CM

## 2013-09-27 DIAGNOSIS — K921 Melena: Secondary | ICD-10-CM

## 2013-09-27 DIAGNOSIS — K922 Gastrointestinal hemorrhage, unspecified: Secondary | ICD-10-CM

## 2013-09-27 DIAGNOSIS — D500 Iron deficiency anemia secondary to blood loss (chronic): Secondary | ICD-10-CM

## 2013-09-27 HISTORY — PX: ESOPHAGOGASTRODUODENOSCOPY: SHX5428

## 2013-09-27 HISTORY — PX: HOT HEMOSTASIS: SHX5433

## 2013-09-27 LAB — CBC
HCT: 24.6 % — ABNORMAL LOW (ref 39.0–52.0)
Hemoglobin: 7.1 g/dL — ABNORMAL LOW (ref 13.0–17.0)
MCH: 25.1 pg — ABNORMAL LOW (ref 26.0–34.0)
MCV: 86.9 fL (ref 78.0–100.0)
Platelets: 269 10*3/uL (ref 150–400)
RBC: 2.83 MIL/uL — ABNORMAL LOW (ref 4.22–5.81)

## 2013-09-27 SURGERY — EGD (ESOPHAGOGASTRODUODENOSCOPY)
Anesthesia: Moderate Sedation

## 2013-09-27 MED ORDER — MIDAZOLAM HCL 10 MG/2ML IJ SOLN
INTRAMUSCULAR | Status: DC | PRN
  Administered 2013-09-27 (×2): 1 mg via INTRAVENOUS
  Administered 2013-09-27 (×2): 2 mg via INTRAVENOUS
  Administered 2013-09-27: 1 mg via INTRAVENOUS

## 2013-09-27 MED ORDER — MIDAZOLAM HCL 10 MG/2ML IJ SOLN
INTRAMUSCULAR | Status: AC
  Filled 2013-09-27: qty 2

## 2013-09-27 MED ORDER — BUTAMBEN-TETRACAINE-BENZOCAINE 2-2-14 % EX AERO
INHALATION_SPRAY | CUTANEOUS | Status: DC | PRN
  Administered 2013-09-27: 2 via TOPICAL

## 2013-09-27 MED ORDER — DIPHENHYDRAMINE HCL 50 MG/ML IJ SOLN
INTRAMUSCULAR | Status: AC
  Filled 2013-09-27: qty 1

## 2013-09-27 MED ORDER — SODIUM CHLORIDE 0.9 % IV SOLN: INTRAVENOUS | Status: DC

## 2013-09-27 MED ORDER — DIPHENHYDRAMINE HCL 50 MG/ML IJ SOLN
INTRAMUSCULAR | Status: DC | PRN
  Administered 2013-09-27: 25 mg via INTRAVENOUS

## 2013-09-27 MED ORDER — FENTANYL CITRATE 0.05 MG/ML IJ SOLN
INTRAMUSCULAR | Status: DC | PRN
  Administered 2013-09-27 (×4): 25 ug via INTRAVENOUS

## 2013-09-27 MED ORDER — SODIUM CHLORIDE 0.9 % IV SOLN
INTRAVENOUS | Status: DC
  Administered 2013-09-27: 500 mL via INTRAVENOUS

## 2013-09-27 MED ORDER — FENTANYL CITRATE 0.05 MG/ML IJ SOLN
INTRAMUSCULAR | Status: AC
  Filled 2013-09-27: qty 2

## 2013-09-27 NOTE — Op Note (Signed)
[facility] [address] [place], [address]   OPERATIVE PROCEDURE REPORT  PATIENT: [name], [name]  MR#: [id number] BIRTHDATE: 1941-05-09  GENDER: Male ENDOSCOPIST: Jeani Hawking, MD ASSISTANT:   Dorisann Frames, technician and Claudie Revering, RN CGRN PROCEDURE DATE: 09/27/2013 PROCEDURE:   EGD with snare polypectomy and APC ASA CLASS:   III INDICATIONS:Bleeding gastric polyps MEDICATIONS: Versed 7 mg IV, Fentanyl 100 mcg IV, and Benadryl 25 mg IV TOPICAL ANESTHETIC:   Cetacaine Spray  DESCRIPTION OF PROCEDURE:   After the risks benefits and alternatives of the procedure were thoroughly explained, informed consent was obtained.  The Pentax Gastroscope [id number]  endoscope was introduced through the mouth  and advanced to the second portion of the duodenum Without limitations.      The instrument was slowly withdrawn as the mucosa was fully examined.      FINDINGS: In the proximal stomach the polyps were again identified. No active bleeding initially.  The prior polypectomy sites appaered to have healed well.  Attention was focused on the proximal greater curvature polyps.  Multiple polyps were removed with a hot snare. A couple of the resections exhibited some minor oozing and this was arrested with hemoclips.  Reexamination of the mucosa revealed some friability at the prior resection sites.  APC was applied to those areas along the lesser curvature.  Some of the post polypectomy sites from today's resection was treated with APC for some very minor bleeding.          The scope was then withdrawn from the patient and the procedure terminated.  COMPLICATIONS: There were no complications. IMPRESSION: 1) Bleeding gastric polyps.  RECOMMENDATIONS: 1) Check HGB today. 2) Repeat CBC in 2-3 weeks in the office.  _______________________________ eSigned:  Jeani Hawking, MD 09/27/2013 10:41 AM

## 2013-09-27 NOTE — H&P (View-Only) (Signed)
Received referral for [facility] Care Management services on behalf of COPD GOLD program. Patient however not eligible for [facility] Care Management at this time due to payor not being in network with [facility] Care Management. Patient already set up with home health services however. Left voicemail for inpatient RNCM to make aware of above.  Atika Hall, MSN-Ed, RN, BSN- [facility] Liaison- [phone] 

## 2013-09-27 NOTE — Interval H&P Note (Signed)
History and Physical Interval Note:  09/27/2013 10:00 AM  [name]  has presented today for surgery, with the diagnosis of AVMs  The various methods of treatment have been discussed with the patient and family. After consideration of risks, benefits and other options for treatment, the patient has consented to  Procedure(s): ESOPHAGOGASTRODUODENOSCOPY (EGD) (N/A) HOT HEMOSTASIS (ARGON PLASMA COAGULATION/BICAP) (N/A) as a surgical intervention .  The patient's history has been reviewed, patient examined, no change in status, stable for surgery.  I have reviewed the patient's chart and labs.  Questions were answered to the patient's satisfaction.     [name]

## 2013-09-30 ENCOUNTER — Encounter (HOSPITAL_COMMUNITY): Payer: Self-pay | Admitting: Gastroenterology

## 2013-10-04 ENCOUNTER — Ambulatory Visit: Payer: Medicare Other | Admitting: Cardiovascular Disease

## 2013-10-08 ENCOUNTER — Other Ambulatory Visit: Payer: Self-pay | Admitting: Family Medicine

## 2013-10-08 NOTE — Telephone Encounter (Signed)
Pt requesting medication refill. Last OV 09/24/13 with future appts scheduled. pls advise

## 2013-10-08 NOTE — Telephone Encounter (Signed)
Rx called in to pharmacy. 

## 2013-10-08 NOTE — Telephone Encounter (Signed)
Ok to phone in ambien 

## 2013-10-08 NOTE — Telephone Encounter (Signed)
Pt left v/m requesting refill zolpidem to [facility]. Pt is out of med and request cb when called to pharmacy. Reviewed refill policy with pt.

## 2013-10-15 ENCOUNTER — Inpatient Hospital Stay (HOSPITAL_COMMUNITY)
Admission: EM | Admit: 2013-10-15 | Discharge: 2013-10-18 | DRG: 812 | Disposition: A | Discharge status: Home Health | Payer: Medicare Other | Attending: Internal Medicine | Admitting: Internal Medicine

## 2013-10-15 ENCOUNTER — Encounter (HOSPITAL_COMMUNITY): Payer: Self-pay | Admitting: Emergency Medicine

## 2013-10-15 ENCOUNTER — Emergency Department (HOSPITAL_COMMUNITY): Payer: Medicare Other

## 2013-10-15 DIAGNOSIS — Z79899 Other long term (current) drug therapy: Secondary | ICD-10-CM

## 2013-10-15 DIAGNOSIS — I10 Essential (primary) hypertension: Secondary | ICD-10-CM | POA: Diagnosis present

## 2013-10-15 DIAGNOSIS — D62 Acute posthemorrhagic anemia: Principal | ICD-10-CM | POA: Diagnosis present

## 2013-10-15 DIAGNOSIS — I2510 Atherosclerotic heart disease of native coronary artery without angina pectoris: Secondary | ICD-10-CM | POA: Diagnosis present

## 2013-10-15 DIAGNOSIS — I252 Old myocardial infarction: Secondary | ICD-10-CM

## 2013-10-15 DIAGNOSIS — J9611 Chronic respiratory failure with hypoxia: Secondary | ICD-10-CM | POA: Diagnosis present

## 2013-10-15 DIAGNOSIS — Z951 Presence of aortocoronary bypass graft: Secondary | ICD-10-CM

## 2013-10-15 DIAGNOSIS — K31811 Angiodysplasia of stomach and duodenum with bleeding: Secondary | ICD-10-CM

## 2013-10-15 DIAGNOSIS — J4489 Other specified chronic obstructive pulmonary disease: Secondary | ICD-10-CM | POA: Diagnosis present

## 2013-10-15 DIAGNOSIS — Z880 Allergy status to penicillin: Secondary | ICD-10-CM

## 2013-10-15 DIAGNOSIS — Z8249 Family history of ischemic heart disease and other diseases of the circulatory system: Secondary | ICD-10-CM

## 2013-10-15 DIAGNOSIS — D500 Iron deficiency anemia secondary to blood loss (chronic): Secondary | ICD-10-CM

## 2013-10-15 DIAGNOSIS — I714 Abdominal aortic aneurysm, without rupture, unspecified: Secondary | ICD-10-CM | POA: Diagnosis present

## 2013-10-15 DIAGNOSIS — J9610 Chronic respiratory failure, unspecified whether with hypoxia or hypercapnia: Secondary | ICD-10-CM | POA: Diagnosis present

## 2013-10-15 DIAGNOSIS — R5383 Other fatigue: Secondary | ICD-10-CM

## 2013-10-15 DIAGNOSIS — F411 Generalized anxiety disorder: Secondary | ICD-10-CM | POA: Diagnosis present

## 2013-10-15 DIAGNOSIS — Z9981 Dependence on supplemental oxygen: Secondary | ICD-10-CM

## 2013-10-15 DIAGNOSIS — E785 Hyperlipidemia, unspecified: Secondary | ICD-10-CM | POA: Diagnosis present

## 2013-10-15 DIAGNOSIS — K921 Melena: Secondary | ICD-10-CM | POA: Diagnosis present

## 2013-10-15 DIAGNOSIS — J449 Chronic obstructive pulmonary disease, unspecified: Secondary | ICD-10-CM | POA: Diagnosis present

## 2013-10-15 DIAGNOSIS — E119 Type 2 diabetes mellitus without complications: Secondary | ICD-10-CM | POA: Diagnosis present

## 2013-10-15 DIAGNOSIS — D509 Iron deficiency anemia, unspecified: Secondary | ICD-10-CM | POA: Diagnosis present

## 2013-10-15 DIAGNOSIS — Z87891 Personal history of nicotine dependence: Secondary | ICD-10-CM

## 2013-10-15 DIAGNOSIS — Z833 Family history of diabetes mellitus: Secondary | ICD-10-CM

## 2013-10-15 DIAGNOSIS — D131 Benign neoplasm of stomach: Secondary | ICD-10-CM | POA: Diagnosis present

## 2013-10-15 DIAGNOSIS — K922 Gastrointestinal hemorrhage, unspecified: Secondary | ICD-10-CM | POA: Diagnosis present

## 2013-10-15 LAB — HEPATIC FUNCTION PANEL
Albumin: 3.2 g/dL — ABNORMAL LOW (ref 3.5–5.2)
Alkaline Phosphatase: 129 U/L — ABNORMAL HIGH (ref 39–117)
Bilirubin, Direct: <0.2 mg/dL (ref 0.0–0.3)
Total Bilirubin: 0.4 mg/dL (ref 0.3–1.2)

## 2013-10-15 LAB — URINALYSIS, ROUTINE W REFLEX MICROSCOPIC
Glucose, UA: NEGATIVE mg/dL
Hgb urine dipstick: NEGATIVE
Ketones, ur: NEGATIVE mg/dL
Leukocytes, UA: NEGATIVE
Nitrite: NEGATIVE
pH: 6.5 (ref 5.0–8.0)

## 2013-10-15 LAB — CBC
HCT: 23.3 % — ABNORMAL LOW (ref 39.0–52.0)
Hemoglobin: 6.3 g/dL — CL (ref 13.0–17.0)
Hemoglobin: 5.9 g/dL — CL (ref 13.0–17.0)
MCH: 21.8 pg — ABNORMAL LOW (ref 26.0–34.0)
MCHC: 27 g/dL — ABNORMAL LOW (ref 30.0–36.0)
MCV: 80.6 fL (ref 78.0–100.0)
MCV: 80.5 fL (ref 78.0–100.0)
Platelets: 264 10*3/uL (ref 150–400)
RBC: 2.89 MIL/uL — ABNORMAL LOW (ref 4.22–5.81)
RBC: 2.66 MIL/uL — ABNORMAL LOW (ref 4.22–5.81)
RDW: 18.5 % — ABNORMAL HIGH (ref 11.5–15.5)
WBC: 8.7 10*3/uL (ref 4.0–10.5)

## 2013-10-15 LAB — PREPARE RBC (CROSSMATCH)

## 2013-10-15 LAB — BASIC METABOLIC PANEL
BUN: 12 mg/dL (ref 6–23)
CO2: 27 mEq/L (ref 19–32)
Chloride: 95 mEq/L — ABNORMAL LOW (ref 96–112)
Creatinine, Ser: 1.04 mg/dL (ref 0.50–1.35)
GFR calc Af Amer: 81 mL/min — ABNORMAL LOW (ref >90)
Glucose, Bld: 203 mg/dL — ABNORMAL HIGH (ref 70–99)
Potassium: 4.9 mEq/L (ref 3.7–5.3)

## 2013-10-15 LAB — APTT: aPTT: 24 seconds (ref 24–37)

## 2013-10-15 LAB — OCCULT BLOOD, POC DEVICE: Fecal Occult Bld: POSITIVE — AB

## 2013-10-15 LAB — POCT I-STAT TROPONIN I: Troponin i, poc: 0.02 ng/mL (ref 0.00–0.08)

## 2013-10-15 MED ORDER — SODIUM CHLORIDE 0.9 % IV SOLN
INTRAVENOUS | Status: AC
  Administered 2013-10-15: 21:00:00 10 mL/h via INTRAVENOUS

## 2013-10-15 MED ORDER — BUDESONIDE-FORMOTEROL FUMARATE 160-4.5 MCG/ACT IN AERO
2.0000 | INHALATION_SPRAY | Freq: Two times a day (BID) | RESPIRATORY_TRACT | Status: DC
  Administered 2013-10-15 – 2013-10-18 (×6): 2 via RESPIRATORY_TRACT
  Filled 2013-10-15: qty 6

## 2013-10-15 MED ORDER — ALPRAZOLAM 0.25 MG PO TABS
0.2500 mg | ORAL_TABLET | Freq: Three times a day (TID) | ORAL | Status: DC | PRN
  Administered 2013-10-16 (×2): 0.25 mg via ORAL
  Filled 2013-10-15 (×2): qty 1

## 2013-10-15 MED ORDER — ZOLPIDEM TARTRATE 10 MG PO TABS
10.0000 mg | ORAL_TABLET | Freq: Once | ORAL | Status: AC
  Administered 2013-10-16: 10 mg via ORAL
  Filled 2013-10-15: qty 1

## 2013-10-15 MED ORDER — METOPROLOL TARTRATE 12.5 MG HALF TABLET
12.5000 mg | ORAL_TABLET | Freq: Two times a day (BID) | ORAL | Status: DC
  Administered 2013-10-16 – 2013-10-18 (×5): 12.5 mg via ORAL
  Filled 2013-10-15 (×6): qty 1

## 2013-10-15 MED ORDER — ALBUTEROL SULFATE HFA 108 (90 BASE) MCG/ACT IN AERS
2.0000 | INHALATION_SPRAY | Freq: Four times a day (QID) | RESPIRATORY_TRACT | Status: DC | PRN
  Filled 2013-10-15: qty 6.7

## 2013-10-15 MED ORDER — ZOLPIDEM TARTRATE 10 MG PO TABS: 10.0000 mg | ORAL_TABLET | Freq: Once | ORAL | Status: DC

## 2013-10-15 MED ORDER — SODIUM CHLORIDE 0.9 % IJ SOLN
3.0000 mL | Freq: Two times a day (BID) | INTRAMUSCULAR | Status: DC
  Administered 2013-10-16 – 2013-10-17 (×4): 3 mL via INTRAVENOUS

## 2013-10-15 MED ORDER — ATORVASTATIN CALCIUM 80 MG PO TABS
80.0000 mg | ORAL_TABLET | Freq: Every evening | ORAL | Status: DC
  Administered 2013-10-16 – 2013-10-17 (×2): 80 mg via ORAL
  Filled 2013-10-15 (×3): qty 1

## 2013-10-15 MED ORDER — FOLIC ACID 1 MG PO TABS
2.0000 mg | ORAL_TABLET | Freq: Every morning | ORAL | Status: DC
  Administered 2013-10-16 – 2013-10-18 (×3): 2 mg via ORAL
  Filled 2013-10-15 (×3): qty 2

## 2013-10-15 MED ORDER — POLYSACCHARIDE IRON COMPLEX 150 MG PO CAPS
150.0000 mg | ORAL_CAPSULE | Freq: Two times a day (BID) | ORAL | Status: DC
  Administered 2013-10-15 – 2013-10-18 (×6): 150 mg via ORAL
  Filled 2013-10-15 (×7): qty 1

## 2013-10-15 MED ORDER — TIOTROPIUM BROMIDE MONOHYDRATE 18 MCG IN CAPS
18.0000 ug | ORAL_CAPSULE | Freq: Every day | RESPIRATORY_TRACT | Status: DC
  Filled 2013-10-15 (×2): qty 5

## 2013-10-15 MED ORDER — ONDANSETRON HCL 4 MG/2ML IJ SOLN: 4.0000 mg | Freq: Four times a day (QID) | INTRAMUSCULAR | Status: DC | PRN

## 2013-10-15 MED ORDER — ONDANSETRON HCL 4 MG PO TABS
4.0000 mg | ORAL_TABLET | Freq: Four times a day (QID) | ORAL | Status: DC | PRN
  Administered 2013-10-15: 21:00:00 4 mg via ORAL
  Filled 2013-10-15: qty 1

## 2013-10-15 MED ORDER — CITALOPRAM HYDROBROMIDE 40 MG PO TABS
40.0000 mg | ORAL_TABLET | Freq: Every day | ORAL | Status: DC
  Administered 2013-10-16 – 2013-10-17 (×2): 40 mg via ORAL
  Filled 2013-10-15 (×2): qty 1

## 2013-10-15 MED ORDER — SODIUM CHLORIDE 0.9 % IV SOLN: INTRAVENOUS | Status: AC

## 2013-10-15 MED ORDER — HYDROCODONE-ACETAMINOPHEN 5-325 MG PO TABS
1.0000 | ORAL_TABLET | Freq: Four times a day (QID) | ORAL | Status: DC | PRN
  Administered 2013-10-15 – 2013-10-17 (×3): 1 via ORAL
  Filled 2013-10-15 (×3): qty 1

## 2013-10-15 MED ORDER — ZOLPIDEM TARTRATE 5 MG PO TABS: 5.0000 mg | ORAL_TABLET | Freq: Once | ORAL | Status: DC

## 2013-10-15 MED ORDER — PANTOPRAZOLE SODIUM 40 MG PO TBEC
40.0000 mg | DELAYED_RELEASE_TABLET | Freq: Two times a day (BID) | ORAL | Status: DC
  Administered 2013-10-15 – 2013-10-18 (×6): 40 mg via ORAL
  Filled 2013-10-15 (×7): qty 1

## 2013-10-15 NOTE — ED Notes (Signed)
hospitalist at bedside

## 2013-10-15 NOTE — ED Notes (Signed)
Pt from home. Reports SOB for past month since he had an endoscopy and had bleeding polyps removed, pt also had 3 blood transfusions. Pt also reports lethargy as well. Hx of anemia and blood transfusions. Pt on 3L [place] at home.

## 2013-10-15 NOTE — ED Notes (Signed)
Lab called with critical value Hgb 6.3, Fleet Contras RN notified

## 2013-10-15 NOTE — ED Notes (Signed)
Bed: WU98 Expected date:  Expected time:  Means of arrival:  Comments: SOB, poss low hgb
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
## 2013-10-16 DIAGNOSIS — D62 Acute posthemorrhagic anemia: Secondary | ICD-10-CM | POA: Diagnosis present

## 2013-10-16 LAB — CBC
HCT: 26.3 % — ABNORMAL LOW (ref 39.0–52.0)
Hemoglobin: 7.6 g/dL — ABNORMAL LOW (ref 13.0–17.0)
MCHC: 28.9 g/dL — ABNORMAL LOW (ref 30.0–36.0)
RBC: 3.22 MIL/uL — ABNORMAL LOW (ref 4.22–5.81)
WBC: 6.1 10*3/uL (ref 4.0–10.5)

## 2013-10-16 LAB — BASIC METABOLIC PANEL
BUN: 15 mg/dL (ref 6–23)
CO2: 28 mEq/L (ref 19–32)
Chloride: 96 mEq/L (ref 96–112)
GFR calc non Af Amer: 70 mL/min — ABNORMAL LOW (ref >90)
Glucose, Bld: 178 mg/dL — ABNORMAL HIGH (ref 70–99)
Potassium: 4.7 mEq/L (ref 3.7–5.3)
Sodium: 135 mEq/L — ABNORMAL LOW (ref 137–147)

## 2013-10-16 LAB — GLUCOSE, CAPILLARY
Glucose-Capillary: 164 mg/dL — ABNORMAL HIGH (ref 70–99)
Glucose-Capillary: 200 mg/dL — ABNORMAL HIGH (ref 70–99)
Glucose-Capillary: 268 mg/dL — ABNORMAL HIGH (ref 70–99)

## 2013-10-16 LAB — PREPARE RBC (CROSSMATCH)

## 2013-10-16 MED ORDER — ZOLPIDEM TARTRATE 10 MG PO TABS
10.0000 mg | ORAL_TABLET | Freq: Every evening | ORAL | Status: DC | PRN
  Administered 2013-10-16 – 2013-10-17 (×2): 10 mg via ORAL
  Filled 2013-10-16 (×2): qty 1

## 2013-10-16 MED ORDER — INSULIN ASPART 100 UNIT/ML ~~LOC~~ SOLN
0.0000 [IU] | SUBCUTANEOUS | Status: DC
  Administered 2013-10-16: 09:00:00 2 [IU] via SUBCUTANEOUS

## 2013-10-16 MED ORDER — INSULIN ASPART 100 UNIT/ML ~~LOC~~ SOLN
0.0000 [IU] | Freq: Every day | SUBCUTANEOUS | Status: DC
  Administered 2013-10-16: 3 [IU] via SUBCUTANEOUS

## 2013-10-16 MED ORDER — INSULIN ASPART 100 UNIT/ML ~~LOC~~ SOLN
0.0000 [IU] | Freq: Three times a day (TID) | SUBCUTANEOUS | Status: DC
  Administered 2013-10-16 (×2): 2 [IU] via SUBCUTANEOUS
  Administered 2013-10-17: 3 [IU] via SUBCUTANEOUS

## 2013-10-16 NOTE — Progress Notes (Signed)
[facility] PROGRESS NOTE   JARIUS DIEUDONNE NWG:[id number] DOB: April 23, 1941 DOA: 10/15/2013 PCP: [name], MD Gastroenterologist: Dr. Jeani Hawking  Brief narrative: [name] is an 72 y.o. male with PMH of chronic back pain on chronic opiate therapy, hypertension, type 2 diabetes, chronic respiratory failure secondary COPD, on 3 L of home oxygen, CAD/stable angina, recent history of iron deficiency anemia secondary to GI bleeding with recent hospitalization 09/07/2013-09/10/2013 status post EGD which showed friable gastric polyps and vascular ectasia with reevaluation as an outpatient on 09/27/2013 with repeat EGD which redemonstrated polyps, status post multiple polyp removal, who was admitted 10/15/2013 with complaints of melena.  Assessment/Plan: Principal Problem:   Acute blood loss anemia secondary to GI bleed Known history of gastric polyps/vascular ectasias that is post recent EGD. Admission hemoglobin 6.3 mg/dL. 2 units of packed red blood cells ordered on admission. GI consultation pending. Continue twice a day PPI therapy. Continue iron supplementation.  Spoke with [name] who recommends allowing him to eat, and following up with him as an outpatient for a repeat capsule endoscopy. Active Problems:   HYPERTENSION Continue metoprolol.   DM Metformin currently on hold. Currently n.p.o. We'll start SSI, insulin sensitive scale, every 4 hours while n.p.o.   COPD (chronic obstructive pulmonary disease) with chronic hypoxemic respiratory failure Continue supplemental oxyge, Spiriva and Symbicort.   CAD (coronary artery disease) with history of stable angina Maintain hemoglobin of at least 8 mg/dL. Nitroglycerin when necessary.  No ASA secondary to GI bleeding.  Continue statin and beta blocker.     Anxiety state, unspecified Continue home dose of Xanax and Celexa.  Code Status: Full. Family Communication: No family at the bedside. Disposition Plan: Home when  stable.   IV access:  Peripheral IV  Medical Consultants:  Gastroenterology  Other Consultants:  None  Anti-infectives:  None  HPI/Subjective: [name] has not had any BMs since last pm.  Complains of ongoing dizziness and a slight chest pain (describes it as feeling hot).  No improvement in symptoms since he received the PRBCs.  Irritable over not being able to eat.  Objective: Filed Vitals:   10/16/13 0240 10/16/13 0340 10/16/13 0440 10/16/13 0455  BP: 111/54 133/66 124/76 124/72  Pulse: 92 92 86 92  Temp: 97.9 F (36.6 C) 97.3 F (36.3 C) 97.2 F (36.2 C) 97.3 F (36.3 C)  TempSrc: Oral Oral Oral Oral  Resp: 18 18 18 18   Height:      Weight:      SpO2: 96% 99% 96% 98%    Intake/Output Summary (Last 24 hours) at 10/16/13 1115 Last data filed at 10/16/13 0600  Gross per 24 hour  Intake 1203.24 ml  Output    750 ml  Net 453.24 ml    Exam: Gen:  NAD, irritable. Cardiovascular:  RRR, No M/R/G Respiratory:  Lungs CTAB Gastrointestinal:  Abdomen soft, NT/ND, + BS Extremities:  No C/E/C  Data Reviewed: Basic Metabolic Panel:  Recent Labs Lab 10/15/13 1515 10/16/13 0820  NA 135* 135*  K 4.9 4.7  CL 95* 96  CO2 27 28  GLUCOSE 203* 178*  BUN 12 15  CREATININE 1.04 1.04  CALCIUM 8.9 8.4   GFR Estimated Creatinine Clearance: 90.9 ml/min (by C-G formula based on Cr of 1.04). Liver Function Tests:  Recent Labs Lab 10/15/13 1515  AST 11  ALT 8  ALKPHOS 129*  BILITOT 0.4  PROT 6.7  ALBUMIN 3.2*    CBC:  Recent Labs Lab 10/15/13  1515 10/15/13 2035 10/16/13 0820  WBC 11.5* 8.7 6.1  HGB 6.3* 5.9* 7.6*  HCT 23.3* 21.4* 26.3*  MCV 80.6 80.5 81.7  PLT 288 264 220   BNP (last 3 results)  Recent Labs  08/19/13 1531 09/07/13 2020 10/15/13 1534  PROBNP 596.9* 293.6* 487.1*   CBG:  Recent Labs Lab 10/16/13 0742  GLUCAP 162*     Procedures and Diagnostic Studies: Dg Chest 2 View (if Patient Has Fever And/or  Copd)  10/15/2013   CLINICAL DATA:  Shortness of Breath  EXAM: CHEST  2 VIEW  COMPARISON:  09/07/2013  FINDINGS: Cardiomediastinal silhouette is stable. Status post CABG. Hyperinflation again noted. Moderate size hiatal hernia. Mild left basilar atelectasis or scarring. No acute infiltrate or pulmonary edema. Stable bullous changes left apex.  IMPRESSION: Hyperinflation again noted. Moderate size hiatal hernia. Mild left basilar atelectasis or scarring. No acute infiltrate or pulmonary edema. Stable bullous changes left apex.   Electronically Signed   By: Natasha Mead M.D.   On: 10/15/2013 14:57    Scheduled Meds: . atorvastatin  80 mg Oral QPM  . budesonide-formoterol  2 puff Inhalation BID  . citalopram  40 mg Oral Daily  . folic acid  2 mg Oral q morning - 10a  . insulin aspart  0-9 Units Subcutaneous Q4H  . iron polysaccharides  150 mg Oral BID  . metoprolol tartrate  12.5 mg Oral BID  . pantoprazole  40 mg Oral BID  . sodium chloride  3 mL Intravenous Q12H  . tiotropium  18 mcg Inhalation Daily   Continuous Infusions:   Time spent: 35 minutes with > 50% of time discussing current diagnostic test results, clinical impression and plan of care.    LOS: 1 day   Diandra Cimini  [facility] Pager [phone].   *Please note that the hospitalists switch teams on Wednesdays. Please call the flow manager at [phone] if you are having difficulty reaching the hospitalist taking care of this patient as she can update you and provide the most up-to-date pager number of provider caring for the patient. If 8PM-8AM, please contact night-coverage at [email or web address], password [facility]  10/16/2013, 11:15 AM

## 2013-10-16 NOTE — Care Management Note (Addendum)
    Page 1 of 1   10/18/2013     3:02:57 PM   CARE MANAGEMENT NOTE 10/18/2013  Patient:  [name], [name]   Account Number:  [id number]  Date Initiated:  10/16/2013  Documentation initiated by:  [facility]  Subjective/Objective Assessment:   72 Y/O M ADMITTED W/MELENA.GIB.LAST ADMISSION 11/25-WELL KNOWN TO HOSPITAL.     Action/Plan:   FROM HOME.HAS PCP,PHARMACY.   Anticipated DC Date:  10/18/2013   Anticipated DC Plan:  HOME W HOME HEALTH SERVICES      DC Planning Services  CM consult      Choice offered to / List presented to:  C-1 Patient        HH arranged  HH-2 PT      [facility] agency  [facility]   Status of service:  Completed, signed off Medicare Important Message given?   (If response is "NO", the following Medicare IM given date fields will be blank) Date Medicare IM given:   Date Additional Medicare IM given:    Discharge Disposition:  HOME W HOME HEALTH SERVICES  Per UR Regulation:  Reviewed for med. necessity/level of care/duration of stay  If discussed at Long Length of Stay Meetings, dates discussed:    Comments:  10/18/12 Glennie Rodda RN,BSN NCM [phone] [facility] CHOSEN FOR HHPT.TC KRISTEN [facility] REP AWARE OF D/C,& D/C ORDER.  10/16/13 Baylon Santelli RN,BSN NCM [phone] NO ANTICIPATED D/C NEEDS.

## 2013-10-17 DIAGNOSIS — R5381 Other malaise: Secondary | ICD-10-CM

## 2013-10-17 DIAGNOSIS — E119 Type 2 diabetes mellitus without complications: Secondary | ICD-10-CM

## 2013-10-17 DIAGNOSIS — R5383 Other fatigue: Secondary | ICD-10-CM

## 2013-10-17 LAB — CBC
HCT: 28.3 % — ABNORMAL LOW (ref 39.0–52.0)
Hemoglobin: 8.3 g/dL — ABNORMAL LOW (ref 13.0–17.0)
MCH: 24.4 pg — ABNORMAL LOW (ref 26.0–34.0)
MCHC: 29.3 g/dL — ABNORMAL LOW (ref 30.0–36.0)
MCV: 83.2 fL (ref 78.0–100.0)
Platelets: 217 10*3/uL (ref 150–400)
RBC: 3.4 MIL/uL — ABNORMAL LOW (ref 4.22–5.81)
RDW: 18 % — ABNORMAL HIGH (ref 11.5–15.5)
WBC: 7.3 10*3/uL (ref 4.0–10.5)

## 2013-10-17 LAB — HEMOGLOBIN AND HEMATOCRIT, BLOOD
HCT: 29.3 % — ABNORMAL LOW (ref 39.0–52.0)
Hemoglobin: 8.7 g/dL — ABNORMAL LOW (ref 13.0–17.0)

## 2013-10-17 LAB — GLUCOSE, CAPILLARY
Glucose-Capillary: 205 mg/dL — ABNORMAL HIGH (ref 70–99)
Glucose-Capillary: 278 mg/dL — ABNORMAL HIGH (ref 70–99)
Glucose-Capillary: 225 mg/dL — ABNORMAL HIGH (ref 70–99)

## 2013-10-17 MED ORDER — INSULIN ASPART 100 UNIT/ML ~~LOC~~ SOLN
0.0000 [IU] | Freq: Three times a day (TID) | SUBCUTANEOUS | Status: DC
  Administered 2013-10-17: 17:00:00 5 [IU] via SUBCUTANEOUS
  Administered 2013-10-17: 8 [IU] via SUBCUTANEOUS

## 2013-10-17 MED ORDER — CITALOPRAM HYDROBROMIDE 20 MG PO TABS
20.0000 mg | ORAL_TABLET | Freq: Every day | ORAL | Status: DC
  Administered 2013-10-18: 20 mg via ORAL
  Filled 2013-10-17: qty 1

## 2013-10-17 MED ORDER — INSULIN ASPART 100 UNIT/ML ~~LOC~~ SOLN
0.0000 [IU] | Freq: Every day | SUBCUTANEOUS | Status: DC
  Administered 2013-10-17: 21:00:00 2 [IU] via SUBCUTANEOUS

## 2013-10-17 MED ORDER — INSULIN GLARGINE 100 UNIT/ML ~~LOC~~ SOLN
10.0000 [IU] | Freq: Every day | SUBCUTANEOUS | Status: DC
  Administered 2013-10-17: 10 [IU] via SUBCUTANEOUS
  Filled 2013-10-17: qty 0.1

## 2013-10-17 NOTE — Evaluation (Signed)
Physical Therapy Evaluation Patient Details Name: [name] MRN: [id number] DOB: 01/18/1941 Today's Date: 10/17/2013 Time: 8657-8469 PT Time Calculation (min): 21 min  PT Assessment / Plan / Recommendation History of Present Illness  73 yo male admitted with acute blood loss anemia, weakness, GI hemorrhage. Hx of chronic back pain, HTN, COPD-O2 dependent, CAD, angina  Clinical Impression  On eval, pt required Min guard assist for mobility-able to ambulate ~50 feet in room without assistive device. Max encouragement to get pt to participate. Recommend HHPT at discharge at this time-may not need it pt progresses with mobility/activity with PT.     PT Assessment  Patient needs continued PT services    Follow Up Recommendations  Home health PT    Does the patient have the potential to tolerate intense rehabilitation      Barriers to Discharge        Equipment Recommendations  None recommended by PT    Recommendations for Other Services     Frequency Min 3X/week    Precautions / Restrictions Precautions Precautions: Fall Precaution Comments: O2 dependent Restrictions Weight Bearing Restrictions: No   Pertinent Vitals/Pain No c/o pain      Mobility  Bed Mobility Bed Mobility: Supine to Sit Supine to Sit: 6: Modified independent (Device/Increase time);HOB flat Transfers Transfers: Sit to Stand;Stand to Sit Sit to Stand: 4: Min guard;From bed Stand to Sit: 4: Min guard;To bed Details for Transfer Assistance: close guard for safety Ambulation/Gait Ambulation/Gait Assistance: 4: Min guard Ambulation Distance (Feet): 50 Feet Assistive device: None Ambulation/Gait Assistance Details: close guard for safety. remained on 3L O2. slightly unsteady. Max encouragment to get pt to get OOB and walk around room.  Gait Pattern: Step-through pattern;Decreased stride length    Exercises     PT Diagnosis: Difficulty walking;Generalized weakness  PT Problem List: Decreased  mobility;Decreased activity tolerance;Decreased strength PT Treatment Interventions: DME instruction;Gait training;Functional mobility training;Therapeutic activities;Therapeutic exercise;Balance training;Patient/family education     PT Goals(Current goals can be found in the care plan section) Acute Rehab PT Goals Patient Stated Goal: home PT Goal Formulation: With patient Time For Goal Achievement: 10/31/13 Potential to Achieve Goals: Good  Visit Information  Last PT Received On: 10/17/13 Assistance Needed: +1 History of Present Illness: 73 yo male admitted with acute blood loss anemia, weakness, GI hemorrhage. Hx of chronic back pain, HTN, COPD-O2 dependent, CAD, angina       Prior Functioning  Home Living Family/patient expects to be discharged to:: Private residence Living Arrangements: Spouse/significant other Available Help at Discharge: Family Type of Home: House Home Access: Stairs to enter Technical brewer of Steps: 4 Entrance Stairs-Rails: [place]: One level Home Equipment: Environmental consultant - 2 wheels;Bedside commode;Cane - single point;Hospital bed Prior Function Level of Independence: Needs assistance Gait / Transfers Assistance Needed: was only walking without assistive device to bathroom at home, limited by SOB Communication Communication: No difficulties Dominant Hand: Right    Cognition  Cognition Arousal/Alertness: Awake/alert Behavior During Therapy: WFL for tasks assessed/performed Overall Cognitive Status: Within Functional Limits for tasks assessed    Extremity/Trunk Assessment Upper Extremity Assessment Upper Extremity Assessment: Overall WFL for tasks assessed Lower Extremity Assessment Lower Extremity Assessment: Generalized weakness Cervical / Trunk Assessment Cervical / Trunk Assessment: Normal   Balance    End of Session PT - End of Session Activity Tolerance: Patient tolerated treatment well Patient left: in bed (sitting  EOB) Nurse Communication: Other (comment) (made RN aware pt was sitting EOB)  GP  Weston Anna, MPT Pager: [phone]

## 2013-10-17 NOTE — Progress Notes (Addendum)
[facility] PROGRESS NOTE   [name] YIR:[id number] DOB: April 08, 1941 DOA: 10/15/2013 PCP: Arnette Norris, MD Gastroenterologist: Dr. Carol Ada  Brief narrative: [name] is an 73 y.o. male with PMH of chronic back pain on chronic opiate therapy, hypertension, type 2 diabetes, chronic respiratory failure secondary COPD, on 3 L of home oxygen, CAD/stable angina, recent history of iron deficiency anemia secondary to GI bleeding with recent hospitalization 09/07/2013-09/10/2013 status post EGD which showed friable gastric polyps and vascular ectasia with reevaluation as an outpatient on 09/27/2013 with repeat EGD which redemonstrated polyps, status post multiple polyp removal, who was admitted 10/15/2013 with complaints of melena.  Assessment/Plan: Principal Problem:   Acute blood loss anemia secondary to GI bleed / fatigue Known history of gastric polyps/vascular ectasias that is post recent EGD. Admission hemoglobin 6.3 mg/dL. Hemoglobin 8.3 mg/dL after 3 units of PRBCs.  Continue twice a day PPI therapy. Continue iron supplementation.  Spoke with Dr. Benson Norway 10/16/13 who recommended allowing him to eat, and following up with him as an outpatient for a repeat capsule endoscopy however the patient remains symptomatic and does not feel he can go home today.  Will ask for formal GI consult.  Continue to monitor H/H.   Active Problems:   HYPERTENSION Continue metoprolol.   DM Metformin currently on hold. Currently on insulin sensitive SSI Q AC/HS.  CBGs 162-268.  Increase SSI to moderate scale and add 10 units of Lantus.   COPD (chronic obstructive pulmonary disease) with chronic hypoxemic respiratory failure Continue supplemental oxyge, Spiriva and Symbicort.   CAD (coronary artery disease) with history of stable angina Maintain hemoglobin of at least 8 mg/dL. Nitroglycerin when necessary.  No ASA secondary to GI bleeding.  Continue statin and beta blocker.     Anxiety state,  unspecified Continue home dose of Xanax, Celexa reduced to 20 mg daily (not recommended in doses above 20 mg in patients over the age of 23 due to risk of QTc prolongation).  Code Status: Full. Family Communication: No family at the bedside.  Wife [name] [phone]) called.  Message left.   Disposition Plan: Home when stable.   IV access:  Peripheral IV  Medical Consultants:  Dr. Silvano Rusk, Gastroenterology  Other Consultants:  None  Anti-infectives:  None  HPI/Subjective: [name] reports having a black stool over past 24 hours.  No frank blood.  Tells me he "has no energy" and that he normally feels better after a blood transfusion.  Does not think he can go home today.  No tolerance for activity.  Denies frank dyspnea, dizziness.  Objective: Filed Vitals:   10/16/13 1515 10/16/13 1915 10/16/13 2144 10/17/13 0447  BP: 115/64  124/58 120/58  Pulse: 95  95 74  Temp: 97.6 F (36.4 C)  97.6 F (36.4 C) 98 F (36.7 C)  TempSrc: Oral  Oral Oral  Resp: 18  18 20   Height:      Weight:      SpO2: 96% 97% 96% 96%    Intake/Output Summary (Last 24 hours) at 10/17/13 0946 Last data filed at 10/17/13 0931  Gross per 24 hour  Intake   1295 ml  Output   1750 ml  Net   -455 ml    Exam: Gen:  NAD, anxious. Cardiovascular:  RRR, No M/R/G Respiratory:  Lungs CTAB Gastrointestinal:  Abdomen soft, NT/ND, + BS Extremities:  No C/E/C  Data Reviewed: Basic Metabolic Panel:  Recent Labs Lab 10/15/13 1515 10/16/13 0820  NA 135*  135*  K 4.9 4.7  CL 95* 96  CO2 27 28  GLUCOSE 203* 178*  BUN 12 15  CREATININE 1.04 1.04  CALCIUM 8.9 8.4   GFR Estimated Creatinine Clearance: 90.9 ml/min (by C-G formula based on Cr of 1.04). Liver Function Tests:  Recent Labs Lab 10/15/13 1515  AST 11  ALT 8  ALKPHOS 129*  BILITOT 0.4  PROT 6.7  ALBUMIN 3.2*    CBC:  Recent Labs Lab 10/15/13 1515 10/15/13 2035 10/16/13 0820 10/17/13 0552  WBC 11.5* 8.7  6.1 7.3  HGB 6.3* 5.9* 7.6* 8.3*  HCT 23.3* 21.4* 26.3* 28.3*  MCV 80.6 80.5 81.7 83.2  PLT 288 264 220 217   BNP (last 3 results)  Recent Labs  08/19/13 1531 09/07/13 2020 10/15/13 1534  PROBNP 596.9* 293.6* 487.1*   CBG:  Recent Labs Lab 10/16/13 0742 10/16/13 1157 10/16/13 1624 10/16/13 2240 10/17/13 0722  GLUCAP 162* 164* 200* 268* 205*     Procedures and Diagnostic Studies:  10/15/13: Dg Chest 2 View (if Patient Has Fever And/or Copd):  IMPRESSION: Hyperinflation again noted. Moderate size hiatal hernia. Mild left basilar atelectasis or scarring. No acute infiltrate or pulmonary edema. Stable bullous changes left apex.   Electronically Signed   By: Lahoma Crocker M.D.   On: 10/15/2013 14:57    Scheduled Meds: . atorvastatin  80 mg Oral QPM  . budesonide-formoterol  2 puff Inhalation BID  . citalopram  40 mg Oral Daily  . folic acid  2 mg Oral q morning - 10a  . insulin aspart  0-5 Units Subcutaneous QHS  . insulin aspart  0-9 Units Subcutaneous TID WC  . iron polysaccharides  150 mg Oral BID  . metoprolol tartrate  12.5 mg Oral BID  . pantoprazole  40 mg Oral BID  . sodium chloride  3 mL Intravenous Q12H  . tiotropium  18 mcg Inhalation Daily   Continuous Infusions:   Time spent: 35 minutes with > 50% of time discussing current diagnostic test results, clinical impression and plan of care.    LOS: 2 days   Earlene Bjelland  [facility] Pager [phone].   *Please note that the hospitalists switch teams on Wednesdays. Please call the flow manager at [phone] if you are having difficulty reaching the hospitalist taking care of this patient as she can update you and provide the most up-to-date pager number of provider caring for the patient. If 8PM-8AM, please contact night-coverage at [email or web address], password [facility]  10/17/2013, 9:46 AM

## 2013-10-18 ENCOUNTER — Encounter: Payer: Self-pay | Admitting: Family

## 2013-10-18 LAB — CBC
HEMATOCRIT: 29.6 % — AB (ref 39.0–52.0)
Hemoglobin: 8.6 g/dL — ABNORMAL LOW (ref 13.0–17.0)
MCH: 24.5 pg — ABNORMAL LOW (ref 26.0–34.0)
MCHC: 29.1 g/dL — ABNORMAL LOW (ref 30.0–36.0)
MCV: 84.3 fL (ref 78.0–100.0)
Platelets: 235 10*3/uL (ref 150–400)
RBC: 3.51 MIL/uL — AB (ref 4.22–5.81)
RDW: 19 % — AB (ref 11.5–15.5)
WBC: 7.2 10*3/uL (ref 4.0–10.5)

## 2013-10-18 LAB — TYPE AND SCREEN
ABO/RH(D): B NEG
Antibody Screen: NEGATIVE
Unit division: 0
Unit division: 0
Unit division: 0

## 2013-10-18 LAB — GLUCOSE, CAPILLARY
Glucose-Capillary: 190 mg/dL — ABNORMAL HIGH (ref 70–99)
Glucose-Capillary: 241 mg/dL — ABNORMAL HIGH (ref 70–99)
Glucose-Capillary: 210 mg/dL — ABNORMAL HIGH (ref 70–99)
Glucose-Capillary: 185 mg/dL — ABNORMAL HIGH (ref 70–99)

## 2013-10-18 MED ORDER — INSULIN ASPART 100 UNIT/ML ~~LOC~~ SOLN: 0.0000 [IU] | Freq: Every day | SUBCUTANEOUS | Status: DC

## 2013-10-18 MED ORDER — CITALOPRAM HYDROBROMIDE 20 MG PO TABS: 20.0000 mg | ORAL_TABLET | Freq: Every day | ORAL | Status: DC

## 2013-10-18 MED ORDER — INSULIN GLARGINE 100 UNIT/ML ~~LOC~~ SOLN
20.0000 [IU] | Freq: Every day | SUBCUTANEOUS | Status: DC
  Filled 2013-10-18: qty 0.2

## 2013-10-18 MED ORDER — INSULIN ASPART 100 UNIT/ML ~~LOC~~ SOLN
0.0000 [IU] | Freq: Three times a day (TID) | SUBCUTANEOUS | Status: DC
  Administered 2013-10-18: 5 [IU] via SUBCUTANEOUS

## 2013-10-18 MED ORDER — INSULIN ASPART 100 UNIT/ML ~~LOC~~ SOLN
4.0000 [IU] | Freq: Three times a day (TID) | SUBCUTANEOUS | Status: DC
  Administered 2013-10-18: 4 [IU] via SUBCUTANEOUS

## 2013-10-18 MED ORDER — CITALOPRAM HYDROBROMIDE 20 MG PO TABS: 40.0000 mg | ORAL_TABLET | Freq: Every day | ORAL | Status: DC

## 2013-10-18 NOTE — Discharge Instructions (Signed)

## 2013-10-18 NOTE — Progress Notes (Signed)
[facility] PROGRESS NOTE   [name] QBH:[id number] DOB: 30-Sep-1941 DOA: 10/15/2013 PCP: Arnette Norris, MD Gastroenterologist: Dr. Carol Ada  Brief narrative: [name] is an 73 y.o. male with PMH of chronic back pain on chronic opiate therapy, hypertension, type 2 diabetes, chronic respiratory failure secondary COPD, on 3 L of home oxygen, CAD/stable angina, recent history of iron deficiency anemia secondary to GI bleeding with recent hospitalization 09/07/2013-09/10/2013 status post EGD which showed friable gastric polyps and vascular ectasia with reevaluation as an outpatient on 09/27/2013 with repeat EGD which redemonstrated polyps, status post multiple polyp removal, who was admitted 10/15/2013 with complaints of melena.  Assessment/Plan: Principal Problem:   Acute blood loss anemia secondary to GI bleed / fatigue Known history of gastric polyps/vascular ectasias that is post recent EGD. Admission hemoglobin 6.3 mg/dL. Hemoglobin 8.3 mg/dL after 3 units of PRBCs.  Continue twice a day PPI therapy. Continue iron supplementation.  Spoke with Dr. Benson Norway 10/16/13 who recommended allowing him to eat, and following up with him as an outpatient for a repeat capsule endoscopy however the patient remains symptomatic and does not feel he can go home today.  Spoke with Dr. Carlean Purl 10/17/13 who was going to see him in consultation (Dr. Benson Norway to see him today).  H/H stable for past 3 checks.   Active Problems:   HYPERTENSION Continue metoprolol.   DM Metformin currently on hold. Currently on insulin sensitive SSI Q AC/HS.  CBGs 200-278. Continue moderate scale SSI and add 4 units of NovoLog before each meal and increase Lantus to 20 units. Change diet to carbohydrate modified.   COPD (chronic obstructive pulmonary disease) with chronic hypoxemic respiratory failure Continue supplemental oxyge, Spiriva and Symbicort.   CAD (coronary artery disease) with history of stable  angina Maintain hemoglobin of at least 8 mg/dL. Nitroglycerin when necessary.  No ASA secondary to GI bleeding.  Continue statin and beta blocker.     Anxiety state, unspecified Continue home dose of Xanax, Celexa reduced to 20 mg daily (not recommended in doses above 20 mg in patients over the age of 28 due to risk of QTc prolongation).  Code Status: Full. Family Communication: No family at the bedside.  Wife [name] [phone]) called and updated by telephone 10/17/2013.   Disposition Plan: Home when stable.   IV access:  Peripheral IV  Medical Consultants:  Dr. Carol Ada, Gastroenterology  Other Consultants:  None  Anti-infectives:  None  HPI/Subjective: [name] tells me he feels a bit better, more energy.  No further black stools, his last BM was "the normal color".  No nausea or vomiting.  Continues to be insistent on staying in the hospital to have the source of his bleeding identified.  Objective: Filed Vitals:   10/17/13 0447 10/17/13 1421 10/17/13 2123 10/18/13 0456  BP: 120/58 123/78 131/62 139/79  Pulse: 74 74 94 78  Temp: 98 F (36.7 C) 97.8 F (36.6 C) 98.2 F (36.8 C) 98 F (36.7 C)  TempSrc: Oral Oral Oral Oral  Resp: 20 20 18 18   Height:      Weight:      SpO2: 96% 99% 100% 97%    Intake/Output Summary (Last 24 hours) at 10/18/13 0832 Last data filed at 10/18/13 0550  Gross per 24 hour  Intake    840 ml  Output   1475 ml  Net   -635 ml    Exam: Gen:  NAD, anxious. Cardiovascular:  RRR, No M/R/G Respiratory:  Lungs CTAB  Gastrointestinal:  Abdomen soft, NT/ND, + BS Extremities:  No C/E/C  Data Reviewed: Basic Metabolic Panel:  Recent Labs Lab 10/15/13 1515 10/16/13 0820  NA 135* 135*  K 4.9 4.7  CL 95* 96  CO2 27 28  GLUCOSE 203* 178*  BUN 12 15  CREATININE 1.04 1.04  CALCIUM 8.9 8.4   GFR Estimated Creatinine Clearance: 90.9 ml/min (by C-G formula based on Cr of 1.04). Liver Function Tests:  Recent Labs Lab  10/15/13 1515  AST 11  ALT 8  ALKPHOS 129*  BILITOT 0.4  PROT 6.7  ALBUMIN 3.2*    CBC:  Recent Labs Lab 10/15/13 1515 10/15/13 2035 10/16/13 0820 10/17/13 0552 10/17/13 1949 10/18/13 0455  WBC 11.5* 8.7 6.1 7.3  --  7.2  HGB 6.3* 5.9* 7.6* 8.3* 8.7* 8.6*  HCT 23.3* 21.4* 26.3* 28.3* 29.3* 29.6*  MCV 80.6 80.5 81.7 83.2  --  84.3  PLT 288 264 220 217  --  235   BNP (last 3 results)  Recent Labs  08/19/13 1531 09/07/13 2020 10/15/13 1534  PROBNP 596.9* 293.6* 487.1*   CBG:  Recent Labs Lab 10/17/13 0722 10/17/13 1219 10/17/13 1645 10/17/13 2120 10/18/13 0723  GLUCAP 205* 278* 225* 241* 210*     Procedures and Diagnostic Studies:  10/15/13: Dg Chest 2 View (if Patient Has Fever And/or Copd):  IMPRESSION: Hyperinflation again noted. Moderate size hiatal hernia. Mild left basilar atelectasis or scarring. No acute infiltrate or pulmonary edema. Stable bullous changes left apex.   Electronically Signed   By: Lahoma Crocker M.D.   On: 10/15/2013 14:57    Scheduled Meds: . atorvastatin  80 mg Oral QPM  . budesonide-formoterol  2 puff Inhalation BID  . citalopram  20 mg Oral Daily  . folic acid  2 mg Oral q morning - 10a  . insulin aspart  0-15 Units Subcutaneous TID WC  . insulin aspart  0-5 Units Subcutaneous QHS  . insulin aspart  4 Units Subcutaneous TID WC  . insulin glargine  20 Units Subcutaneous QHS  . iron polysaccharides  150 mg Oral BID  . metoprolol tartrate  12.5 mg Oral BID  . pantoprazole  40 mg Oral BID  . sodium chloride  3 mL Intravenous Q12H  . tiotropium  18 mcg Inhalation Daily   Continuous Infusions:   Time spent: 25 minutes.    LOS: 3 days   [facility] Pager [phone].   *Please note that the hospitalists switch teams on Wednesdays. Please call the flow manager at [phone] if you are having difficulty reaching the hospitalist taking care of this patient as she can update you and provide the most up-to-date  pager number of provider caring for the patient. If 8PM-8AM, please contact night-coverage at [email or web address], password [facility]  10/18/2013, 8:32 AM

## 2013-10-18 NOTE — Discharge Summary (Signed)
Physician Discharge Summary  [name] BWG:[id number] DOB: 03/27/1941 DOA: 10/15/2013  PCP: Arnette Norris, MD  Admit date: 10/15/2013 Discharge date: 10/18/2013  Recommendations for Outpatient Follow-up:  1. F/U with Dr. Benson Norway as recommended. 2. Repeat CBC in 2-3 days recommended, sooner if patient reports ongoing black or bloody stools. 3. Celexa reduces per pharmacy recommendations. 4. Recommend close outpatient follow up of glycemic control.  Discharge Diagnoses:  Principal Problem:    Acute blood loss anemia Active Problems:    HYPERTENSION    DM    COPD (chronic obstructive pulmonary disease)    CAD (coronary artery disease)    Fatigue    Chronic hypoxemic respiratory failure    GI bleed    Anxiety state, unspecified   Discharge Condition: Stable.  Diet recommendation: Low sodium, heart healthy, carbohydrate modified.  History of present illness:  [name] is an 73 y.o. male with PMH of chronic back pain on chronic opiate therapy, hypertension, type 2 diabetes, chronic respiratory failure secondary COPD, on 3 L of home oxygen, CAD/stable angina, recent history of iron deficiency anemia secondary to GI bleeding with recent hospitalization 09/07/2013-09/10/2013 status post EGD which showed friable gastric polyps and vascular ectasia with reevaluation as an outpatient on 09/27/2013 with repeat EGD which redemonstrated polyps, status post multiple polyp removal, who was admitted 10/15/2013 with complaints of melena.  Hospital Course by problem:  Principal Problem:  Acute blood loss anemia secondary to GI bleed / fatigue  Known history of gastric polyps/vascular ectasias that is post recent EGD. Admission hemoglobin 6.3 mg/dL. Hemoglobin 8.3 mg/dL after 3 units of PRBCs. Continue twice a day PPI therapy. Continue iron supplementation. Spoke with Dr. Benson Norway 10/16/13 who recommended allowing him to eat, and following up with him as an outpatient for a repeat  capsule endoscopy however the patient remains symptomatic and does not feel he can go home today. Spoke with Dr. Carlean Purl 10/17/13 who was going to see him in consultation (Dr. Benson Norway to see him today). H/H stable for past 3 checks. Dr. Benson Norway to repeat capsule endoscopy as an outpatient. Active Problems:  HYPERTENSION  Continue metoprolol.  DM  Metformin held while in the hospital. Treated with Lantus and SSI. Recommend close follow up of outpatient glycemic control. COPD (chronic obstructive pulmonary disease) with chronic hypoxemic respiratory failure  Treated with supplemental oxygen, Spiriva and Symbicort.  CAD (coronary artery disease) with history of stable angina  Maintain hemoglobin of at least 8 mg/dL. Nitroglycerin when necessary. No ASA secondary to GI bleeding. Continue statin and beta blocker.  Anxiety state, unspecified  Continue home dose of Xanax, Celexa reduced to 20 mg daily (not recommended in doses above 20 mg in patients over the age of 30 due to risk of QTc prolongation).  Procedures:  None.  Consultations:  Dr. Carol Ada, Gastroenterology.  Discharge Exam: Filed Vitals:   10/18/13 1310  BP: 109/69  Pulse: 76  Temp:   Resp:    Filed Vitals:   10/17/13 1421 10/17/13 2123 10/18/13 0456 10/18/13 1310  BP: 123/78 131/62 139/79 109/69  Pulse: 74 94 78 76  Temp: 97.8 F (36.6 C) 98.2 F (36.8 C) 98 F (36.7 C)   TempSrc: Oral Oral Oral   Resp: 20 18 18    Height:      Weight:      SpO2: 99% 100% 97%     Gen:  NAD Cardiovascular:  RRR, No M/R/G Respiratory: Lungs CTAB Gastrointestinal: Abdomen soft, NT/ND with normal active bowel sounds.  Extremities: No C/E/C   Discharge Instructions  Discharge Orders   Future Appointments Provider Department Dept Phone   10/21/2013 11:00 AM Annye Rusk, NP [name] [phone]   11/01/2013 1:30 PM Runell Gess, MD [facility] [phone]   Future Orders Complete  By Expires   Call MD for:  extreme fatigue  As directed    Call MD for:  As directed    Scheduling Instructions:     Recurrent black or bloody stools, weakness, dizziness.   Diet - low sodium heart healthy  As directed    Diet Carb Modified  As directed    Discharge instructions  As directed    Comments:     Your dose of Celexa was reduced per pharmacist recommendations.  Doses above 20 mg are not recommended in anyone over the age of 58 because higher doses can cause fatal arrhythmias.  You were cared for by Dr. Hillery Aldo  (a hospitalist) during your hospital stay. If you have any questions about your discharge medications or the care you received while you were in the hospital after you are discharged, you can call the unit and ask to speak with the hospitalist on call if the hospitalist that took care of you is not available. Once you are discharged, your primary care physician will handle any further medical issues. Please note that NO REFILLS for any discharge medications will be authorized once you are discharged, as it is imperative that you return to your primary care physician (or establish a relationship with a primary care physician if you do not have one) for your aftercare needs so that they can reassess your need for medications and monitor your lab values.  Any outstanding tests can be reviewed by your PCP at your follow up visit.  It is also important to review any medicine changes with your PCP.  Please bring these d/c instructions with you to your next visit so your physician can review these changes with you.  If you do not have a primary care physician, you can call [phone] for a physician referral.  It is highly recommended that you obtain a PCP for hospital follow up.   Increase activity slowly  As directed        Medication List         albuterol 108 (90 BASE) MCG/ACT inhaler  Commonly known as:  PROVENTIL HFA;VENTOLIN HFA  Inhale 2 puffs into the lungs every 6  (six) hours as needed for wheezing.     ALPRAZolam 0.25 MG tablet  Commonly known as:  XANAX  Take 0.25 mg by mouth 3 (three) times daily as needed for anxiety.     atorvastatin 80 MG tablet  Commonly known as:  LIPITOR  Take 80 mg by mouth every evening.     budesonide-formoterol 160-4.5 MCG/ACT inhaler  Commonly known as:  SYMBICORT  Inhale 2 puffs into the lungs 2 (two) times daily.     citalopram 20 MG tablet  Commonly known as:  CELEXA  Take 2-4 tablets (40-80 mg total) by mouth daily.     clobetasol ointment 0.05 %  Commonly known as:  TEMOVATE  Apply 1 application topically 2 (two) times daily as needed (rash).     folic acid 1 MG tablet  Commonly known as:  FOLVITE  Take 2 mg by mouth every morning.     HYDROcodone-acetaminophen 5-325 MG per tablet  Commonly known as:  NORCO/VICODIN  Take 1 tablet  by mouth every 6 (six) hours as needed for moderate pain.     iron polysaccharides 150 MG capsule  Commonly known as:  NIFEREX  Take 150 mg by mouth 2 (two) times daily.     metFORMIN 500 MG tablet  Commonly known as:  GLUCOPHAGE  Take 500 mg by mouth 2 (two) times daily with a meal.     metoprolol tartrate 25 MG tablet  Commonly known as:  LOPRESSOR  Take 25 mg by mouth 2 (two) times daily.     pantoprazole 40 MG tablet  Commonly known as:  PROTONIX  Take 1 tablet (40 mg total) by mouth 2 (two) times daily.     tiotropium 18 MCG inhalation capsule  Commonly known as:  SPIRIVA  Place 18 mcg into inhaler and inhale daily.     zolpidem 10 MG tablet  Commonly known as:  AMBIEN  Take 10 mg by mouth at bedtime.           Follow-up Information   Follow up with Arnette Norris, MD. Schedule an appointment as soon as possible for a visit in 2 weeks. [facility] follow up)    Specialty:  Family Medicine   Contact information:   [address] [address] [phone]       Follow up with HUNG,PATRICK D, MD. Schedule an appointment as soon as possible  for a visit in 1 week.   Specialty:  Gastroenterology   Contact information:   [address] [place] [address] [phone]        The results of significant diagnostics from this hospitalization (including imaging, microbiology, ancillary and laboratory) are listed below for reference.    Significant Diagnostic Studies: Dg Chest 2 View (if Patient Has Fever And/or Copd)  10/15/2013   CLINICAL DATA:  Shortness of Breath  EXAM: CHEST  2 VIEW  COMPARISON:  09/07/2013  FINDINGS: Cardiomediastinal silhouette is stable. Status post CABG. Hyperinflation again noted. Moderate size hiatal hernia. Mild left basilar atelectasis or scarring. No acute infiltrate or pulmonary edema. Stable bullous changes left apex.  IMPRESSION: Hyperinflation again noted. Moderate size hiatal hernia. Mild left basilar atelectasis or scarring. No acute infiltrate or pulmonary edema. Stable bullous changes left apex.   Electronically Signed   By: Lahoma Crocker M.D.   On: 10/15/2013 14:57    Labs:  Basic Metabolic Panel:  Recent Labs Lab 10/15/13 1515 10/16/13 0820  NA 135* 135*  K 4.9 4.7  CL 95* 96  CO2 27 28  GLUCOSE 203* 178*  BUN 12 15  CREATININE 1.04 1.04  CALCIUM 8.9 8.4   GFR Estimated Creatinine Clearance: 90.9 ml/min (by C-G formula based on Cr of 1.04). Liver Function Tests:  Recent Labs Lab 10/15/13 1515  AST 11  ALT 8  ALKPHOS 129*  BILITOT 0.4  PROT 6.7  ALBUMIN 3.2*   CBC:  Recent Labs Lab 10/15/13 1515 10/15/13 2035 10/16/13 0820 10/17/13 0552 10/17/13 1949 10/18/13 0455  WBC 11.5* 8.7 6.1 7.3  --  7.2  HGB 6.3* 5.9* 7.6* 8.3* 8.7* 8.6*  HCT 23.3* 21.4* 26.3* 28.3* 29.3* 29.6*  MCV 80.6 80.5 81.7 83.2  --  84.3  PLT 288 264 220 217  --  235   CBG:  Recent Labs Lab 10/17/13 1219 10/17/13 1645 10/17/13 2120 10/18/13 0723 10/18/13 1213  GLUCAP 278* 225* 241* 210* 185*   Time coordinating discharge: 35  minutes.  Signed:  RAMA,CHRISTINA  Pager [phone] [facility] 10/18/2013, 2:23 PM

## 2013-10-18 NOTE — Progress Notes (Signed)
Discharge instructions explained using teach back, Stable for discharge.
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
## 2013-10-18 NOTE — Progress Notes (Signed)
Wife here to take patient home, home O2 bottle empty, offered to call [facility] to bring fresh bottle, patient declined. States he will just go without. Patient to car without incident.

## 2013-10-21 ENCOUNTER — Inpatient Hospital Stay (HOSPITAL_COMMUNITY): Admission: RE | Admit: 2013-10-21 | Payer: Self-pay | Source: Ambulatory Visit

## 2013-10-21 ENCOUNTER — Other Ambulatory Visit: Payer: Self-pay

## 2013-10-21 ENCOUNTER — Telehealth: Payer: Self-pay | Admitting: Cardiovascular Disease

## 2013-10-21 ENCOUNTER — Ambulatory Visit: Payer: PRIVATE HEALTH INSURANCE | Admitting: Family

## 2013-10-21 MED ORDER — HYDROCODONE-ACETAMINOPHEN 5-325 MG PO TABS: 1.0000 | ORAL_TABLET | Freq: Four times a day (QID) | ORAL | Status: DC | PRN

## 2013-10-21 MED ORDER — METOPROLOL TARTRATE 25 MG PO TABS: 25.0000 mg | ORAL_TABLET | Freq: Two times a day (BID) | ORAL | Status: DC

## 2013-10-21 NOTE — Telephone Encounter (Signed)
Pt s wife left v/m requesting rx hydrocodone apap. Call when ready for pick up. [name] wants to know if pt has to come to pick up rx or can Mrs Nguyen pick up rx for pt.

## 2013-10-21 NOTE — Telephone Encounter (Signed)
Spoke to pt and informed that Rx is available at the front desk;informed gov't issued photo id required for pickup

## 2013-10-21 NOTE — Telephone Encounter (Signed)
Needs to get refill on Metoprolol Tartrate 25 mg twice daily.  Was a [facility] patient  Was using mail order for RX  They just changed mail order company and is running out  Needs to be called to [facility].  Please call.

## 2013-10-21 NOTE — Telephone Encounter (Signed)
Returned call and pt verified x 2 w/ [place], pt's wife and pt.  Informed message received and refill will be sent w/ enough med to last until appt on 1.16.15 at 1:30pm w/ Dr. Gwenlyn Found.  Informed must keep appt for refills as last OV was July 2013.  Pt/wife verbalized understanding.  Refill(s) sent to pharmacy.

## 2013-10-29 ENCOUNTER — Encounter: Payer: Self-pay | Admitting: *Deleted

## 2013-10-29 ENCOUNTER — Encounter: Payer: Self-pay | Admitting: Family Medicine

## 2013-10-29 ENCOUNTER — Ambulatory Visit (INDEPENDENT_AMBULATORY_CARE_PROVIDER_SITE_OTHER): Payer: Commercial Managed Care - HMO | Admitting: Family Medicine

## 2013-10-29 VITALS — BP 124/62 | HR 78 | Temp 97.8°F | Wt 264.8 lb

## 2013-10-29 DIAGNOSIS — K922 Gastrointestinal hemorrhage, unspecified: Secondary | ICD-10-CM

## 2013-10-29 LAB — CBC WITH DIFFERENTIAL/PLATELET
BASOS PCT: 0.8 % (ref 0.0–3.0)
Basophils Absolute: 0.1 10*3/uL (ref 0.0–0.1)
EOS PCT: 1.2 % (ref 0.0–5.0)
Eosinophils Absolute: 0.1 10*3/uL (ref 0.0–0.7)
HEMATOCRIT: 31.8 % — AB (ref 39.0–52.0)
HEMOGLOBIN: 9.9 g/dL — AB (ref 13.0–17.0)
LYMPHS ABS: 0.9 10*3/uL (ref 0.7–4.0)
Lymphocytes Relative: 8 % — ABNORMAL LOW (ref 12.0–46.0)
MCHC: 31 g/dL (ref 30.0–36.0)
MCV: 78.1 fl (ref 78.0–100.0)
Monocytes Absolute: 0.7 10*3/uL (ref 0.1–1.0)
Monocytes Relative: 6.4 % (ref 3.0–12.0)
Neutro Abs: 9.4 10*3/uL — ABNORMAL HIGH (ref 1.4–7.7)
Neutrophils Relative %: 83.6 % — ABNORMAL HIGH (ref 43.0–77.0)
Platelets: 376 10*3/uL (ref 150.0–400.0)
RBC: 4.07 Mil/uL — AB (ref 4.22–5.81)
RDW: 22.5 % — ABNORMAL HIGH (ref 11.5–14.6)
WBC: 11.2 10*3/uL — ABNORMAL HIGH (ref 4.5–10.5)

## 2013-10-29 NOTE — Patient Instructions (Signed)
Good to see you. We will call you with your lab results.   

## 2013-10-29 NOTE — Assessment & Plan Note (Signed)
With h/o AVMs and polyps. S/p 3 units PRBCs this admission. Check CBC today. Has follow up with cardiology and Dr. Benson Norway scheduled.
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
## 2013-10-29 NOTE — Progress Notes (Signed)
Pre-visit discussion using our clinic review tool. No additional management support is needed unless otherwise documented below in the visit note.  

## 2013-11-01 ENCOUNTER — Encounter: Payer: Self-pay | Admitting: Family

## 2013-11-01 ENCOUNTER — Ambulatory Visit: Payer: Medicare Other | Admitting: Cardiovascular Disease

## 2013-11-01 DIAGNOSIS — I10 Essential (primary) hypertension: Secondary | ICD-10-CM

## 2013-11-01 DIAGNOSIS — IMO0001 Reserved for inherently not codable concepts without codable children: Secondary | ICD-10-CM

## 2013-11-01 DIAGNOSIS — E119 Type 2 diabetes mellitus without complications: Secondary | ICD-10-CM

## 2013-11-04 ENCOUNTER — Encounter: Payer: Self-pay | Admitting: Family

## 2013-11-04 ENCOUNTER — Ambulatory Visit (INDEPENDENT_AMBULATORY_CARE_PROVIDER_SITE_OTHER): Payer: Medicare PPO | Admitting: Family

## 2013-11-04 ENCOUNTER — Ambulatory Visit (HOSPITAL_COMMUNITY)
Admission: RE | Admit: 2013-11-04 | Discharge: 2013-11-04 | Disposition: A | Discharge status: Home / Self Care | Payer: Medicare PPO | Source: Ambulatory Visit | Attending: Family | Admitting: Family

## 2013-11-04 VITALS — BP 139/81 | HR 79 | Resp 16 | Ht 75.0 in | Wt 263.0 lb

## 2013-11-04 DIAGNOSIS — I714 Abdominal aortic aneurysm, without rupture, unspecified: Secondary | ICD-10-CM | POA: Insufficient documentation

## 2013-11-04 DIAGNOSIS — Z48812 Encounter for surgical aftercare following surgery on the circulatory system: Secondary | ICD-10-CM

## 2013-11-04 DIAGNOSIS — Z09 Encounter for follow-up examination after completed treatment for conditions other than malignant neoplasm: Secondary | ICD-10-CM | POA: Insufficient documentation
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
## 2013-11-05 ENCOUNTER — Other Ambulatory Visit: Payer: Self-pay | Admitting: *Deleted

## 2013-11-05 ENCOUNTER — Other Ambulatory Visit: Payer: Self-pay | Admitting: Internal Medicine

## 2013-11-05 ENCOUNTER — Other Ambulatory Visit: Payer: Self-pay

## 2013-11-05 DIAGNOSIS — I714 Abdominal aortic aneurysm, without rupture, unspecified: Secondary | ICD-10-CM

## 2013-11-05 DIAGNOSIS — Z48812 Encounter for surgical aftercare following surgery on the circulatory system: Secondary | ICD-10-CM

## 2013-11-05 MED ORDER — ALPRAZOLAM 0.25 MG PO TABS: 0.2500 mg | ORAL_TABLET | Freq: Three times a day (TID) | ORAL | Status: DC | PRN

## 2013-11-05 MED ORDER — METOPROLOL TARTRATE 25 MG PO TABS: 25.0000 mg | ORAL_TABLET | Freq: Two times a day (BID) | ORAL | Status: DC

## 2013-11-05 NOTE — Telephone Encounter (Signed)
Stacy PT with [facility] left v/m; Stacy concerned pt is out of BP med and is not due to get more BP med until sees PCP.Stacy concerned pt stopping BP med and pt also needs refill Alprazolam. Stacy request cb.

## 2013-11-05 NOTE — Telephone Encounter (Signed)
I just saw him.  He does not need to see me for more refills.  Ok to refill both.

## 2013-11-05 NOTE — Telephone Encounter (Signed)
Spoke to pts wife and informed her pts Rx has been sent to the requested pharmacy

## 2013-11-08 ENCOUNTER — Other Ambulatory Visit: Payer: Self-pay | Admitting: Internal Medicine

## 2013-11-11 ENCOUNTER — Telehealth: Payer: Self-pay

## 2013-11-11 NOTE — Telephone Encounter (Signed)
Spoke to pts wife and informed her Rx has been faxed to requested pharmacy

## 2013-11-11 NOTE — Telephone Encounter (Signed)
It looks like it was last filled 06/2013--please advise

## 2013-11-11 NOTE — Telephone Encounter (Signed)
Record Num: [id number] Operator: Benita Stabile Patient Name: [name] Call Date & Time: 11/08/2013 6:54:24PM Patient Phone: [phone] PCP: Arnette Norris Patient Gender: Male PCP Fax : [phone] Patient DOB: 1941-03-04 Practice Name: [facility] Reason for Call: Caller: [name]/Patient; PCP: Arnette Norris (Family Practice); CB#: [phone]; Call regarding Medication Issue; Medication(s): Ambien. Last filled 10/08/13. Pt without any more medication. Standing order approves till next business day.Ambien 10mg  QHS #2 for weekend. Called to [facility] [phone]. Pt to f/u 11/11/13 for further refills. Protocol(s) Used: Medication Questions - Adult Recommended Outcome per Protocol: Provided Health Information Reason for Outcome: Caller has medication question(s) that was answered with available resources Care Advice: ~ 01/

## 2013-11-26 ENCOUNTER — Other Ambulatory Visit: Payer: Self-pay | Admitting: Cardiovascular Disease

## 2013-12-02 ENCOUNTER — Other Ambulatory Visit: Payer: Self-pay | Admitting: *Deleted

## 2013-12-02 ENCOUNTER — Telehealth: Payer: Self-pay | Admitting: Family Medicine

## 2013-12-02 MED ORDER — HYDROCODONE-ACETAMINOPHEN 5-325 MG PO TABS: 1.0000 | ORAL_TABLET | Freq: Four times a day (QID) | ORAL | Status: DC | PRN

## 2013-12-02 NOTE — Telephone Encounter (Signed)
Spoke to pt and informed him that Rx will be available for pickup at the front desk; informed a gov't issued photo id required for pickup 

## 2013-12-02 NOTE — Telephone Encounter (Signed)
Pt requesting medication refill. Last ov 10/2013 with no future appts scheduled. pls advise

## 2013-12-02 NOTE — Telephone Encounter (Signed)
Spoke to pt and informed him that Rx will be available for pickup at the front desk; informed a gov't issued photo id required for pickup

## 2013-12-02 NOTE — Telephone Encounter (Signed)
Ok to refill one time only. 
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
## 2013-12-04 ENCOUNTER — Inpatient Hospital Stay (HOSPITAL_COMMUNITY): Payer: Medicare PPO

## 2013-12-04 ENCOUNTER — Encounter (HOSPITAL_COMMUNITY): Payer: Self-pay | Admitting: Emergency Medicine

## 2013-12-04 ENCOUNTER — Inpatient Hospital Stay (HOSPITAL_COMMUNITY)
Admission: EM | Admit: 2013-12-04 | Discharge: 2013-12-10 | DRG: 378 | Disposition: A | Discharge status: Home Health | Payer: Medicare PPO | Attending: Internal Medicine | Admitting: Internal Medicine

## 2013-12-04 ENCOUNTER — Emergency Department (HOSPITAL_COMMUNITY): Payer: Medicare PPO

## 2013-12-04 ENCOUNTER — Encounter (HOSPITAL_COMMUNITY)
Admission: EM | Disposition: A | Discharge status: Home Health | Payer: Self-pay | Source: Home / Self Care | Attending: Internal Medicine

## 2013-12-04 DIAGNOSIS — J9610 Chronic respiratory failure, unspecified whether with hypoxia or hypercapnia: Secondary | ICD-10-CM | POA: Diagnosis present

## 2013-12-04 DIAGNOSIS — D72829 Elevated white blood cell count, unspecified: Secondary | ICD-10-CM

## 2013-12-04 DIAGNOSIS — D62 Acute posthemorrhagic anemia: Secondary | ICD-10-CM

## 2013-12-04 DIAGNOSIS — Z7982 Long term (current) use of aspirin: Secondary | ICD-10-CM

## 2013-12-04 DIAGNOSIS — K5730 Diverticulosis of large intestine without perforation or abscess without bleeding: Secondary | ICD-10-CM | POA: Diagnosis present

## 2013-12-04 DIAGNOSIS — M549 Dorsalgia, unspecified: Secondary | ICD-10-CM | POA: Diagnosis present

## 2013-12-04 DIAGNOSIS — I714 Abdominal aortic aneurysm, without rupture, unspecified: Secondary | ICD-10-CM

## 2013-12-04 DIAGNOSIS — Z87891 Personal history of nicotine dependence: Secondary | ICD-10-CM

## 2013-12-04 DIAGNOSIS — D649 Anemia, unspecified: Secondary | ICD-10-CM

## 2013-12-04 DIAGNOSIS — F1123 Opioid dependence with withdrawal: Secondary | ICD-10-CM

## 2013-12-04 DIAGNOSIS — F1193 Opioid use, unspecified with withdrawal: Secondary | ICD-10-CM

## 2013-12-04 DIAGNOSIS — Z79899 Other long term (current) drug therapy: Secondary | ICD-10-CM

## 2013-12-04 DIAGNOSIS — D500 Iron deficiency anemia secondary to blood loss (chronic): Secondary | ICD-10-CM

## 2013-12-04 DIAGNOSIS — Z791 Long term (current) use of non-steroidal anti-inflammatories (NSAID): Secondary | ICD-10-CM

## 2013-12-04 DIAGNOSIS — J9611 Chronic respiratory failure with hypoxia: Secondary | ICD-10-CM | POA: Diagnosis present

## 2013-12-04 DIAGNOSIS — E785 Hyperlipidemia, unspecified: Secondary | ICD-10-CM | POA: Diagnosis present

## 2013-12-04 DIAGNOSIS — R109 Unspecified abdominal pain: Secondary | ICD-10-CM

## 2013-12-04 DIAGNOSIS — I2510 Atherosclerotic heart disease of native coronary artery without angina pectoris: Secondary | ICD-10-CM | POA: Diagnosis present

## 2013-12-04 DIAGNOSIS — Z885 Allergy status to narcotic agent status: Secondary | ICD-10-CM

## 2013-12-04 DIAGNOSIS — J441 Chronic obstructive pulmonary disease with (acute) exacerbation: Secondary | ICD-10-CM

## 2013-12-04 DIAGNOSIS — G8929 Other chronic pain: Secondary | ICD-10-CM | POA: Diagnosis present

## 2013-12-04 DIAGNOSIS — K921 Melena: Secondary | ICD-10-CM

## 2013-12-04 DIAGNOSIS — K922 Gastrointestinal hemorrhage, unspecified: Secondary | ICD-10-CM

## 2013-12-04 DIAGNOSIS — Z881 Allergy status to other antibiotic agents status: Secondary | ICD-10-CM

## 2013-12-04 DIAGNOSIS — R000 Tachycardia, unspecified: Secondary | ICD-10-CM

## 2013-12-04 DIAGNOSIS — R413 Other amnesia: Secondary | ICD-10-CM | POA: Diagnosis present

## 2013-12-04 DIAGNOSIS — K5521 Angiodysplasia of colon with hemorrhage: Principal | ICD-10-CM | POA: Diagnosis present

## 2013-12-04 DIAGNOSIS — E119 Type 2 diabetes mellitus without complications: Secondary | ICD-10-CM | POA: Diagnosis present

## 2013-12-04 DIAGNOSIS — K5520 Angiodysplasia of colon without hemorrhage: Secondary | ICD-10-CM

## 2013-12-04 DIAGNOSIS — I252 Old myocardial infarction: Secondary | ICD-10-CM

## 2013-12-04 DIAGNOSIS — Z880 Allergy status to penicillin: Secondary | ICD-10-CM

## 2013-12-04 DIAGNOSIS — I10 Essential (primary) hypertension: Secondary | ICD-10-CM | POA: Diagnosis present

## 2013-12-04 DIAGNOSIS — Z833 Family history of diabetes mellitus: Secondary | ICD-10-CM

## 2013-12-04 DIAGNOSIS — Z951 Presence of aortocoronary bypass graft: Secondary | ICD-10-CM

## 2013-12-04 HISTORY — PX: GIVENS CAPSULE STUDY: SHX5432

## 2013-12-04 HISTORY — DX: Elevated white blood cell count, unspecified: D72.829

## 2013-12-04 LAB — CBC
HCT: 26.7 % — ABNORMAL LOW (ref 39.0–52.0)
HEMOGLOBIN: 8.2 g/dL — AB (ref 13.0–17.0)
MCH: 25.7 pg — AB (ref 26.0–34.0)
MCHC: 30.7 g/dL (ref 30.0–36.0)
MCV: 83.7 fL (ref 78.0–100.0)
Platelets: 228 10*3/uL (ref 150–400)
RBC: 3.19 MIL/uL — ABNORMAL LOW (ref 4.22–5.81)
RDW: 17.4 % — ABNORMAL HIGH (ref 11.5–15.5)
WBC: 10.7 10*3/uL — AB (ref 4.0–10.5)

## 2013-12-04 LAB — MRSA PCR SCREENING: MRSA by PCR: NEGATIVE

## 2013-12-04 LAB — BASIC METABOLIC PANEL
BUN: 35 mg/dL — AB (ref 6–23)
CHLORIDE: 102 meq/L (ref 96–112)
CO2: 24 meq/L (ref 19–32)
CREATININE: 1 mg/dL (ref 0.50–1.35)
Calcium: 8.6 mg/dL (ref 8.4–10.5)
GFR calc non Af Amer: 73 mL/min — ABNORMAL LOW (ref >90)
GFR, EST AFRICAN AMERICAN: 85 mL/min — AB (ref >90)
Glucose, Bld: 220 mg/dL — ABNORMAL HIGH (ref 70–99)
Potassium: 5.1 mEq/L (ref 3.7–5.3)
Sodium: 137 mEq/L (ref 137–147)

## 2013-12-04 LAB — CBC WITH DIFFERENTIAL/PLATELET
BASOS ABS: 0 10*3/uL (ref 0.0–0.1)
Basophils Relative: 0 % (ref 0–1)
EOS ABS: 0 10*3/uL (ref 0.0–0.7)
Eosinophils Relative: 0 % (ref 0–5)
HCT: 25.3 % — ABNORMAL LOW (ref 39.0–52.0)
HEMOGLOBIN: 7.1 g/dL — AB (ref 13.0–17.0)
LYMPHS ABS: 1.6 10*3/uL (ref 0.7–4.0)
Lymphocytes Relative: 9 % — ABNORMAL LOW (ref 12–46)
MCH: 23.9 pg — ABNORMAL LOW (ref 26.0–34.0)
MCHC: 28.1 g/dL — ABNORMAL LOW (ref 30.0–36.0)
MCV: 85.2 fL (ref 78.0–100.0)
Monocytes Absolute: 1.4 10*3/uL — ABNORMAL HIGH (ref 0.1–1.0)
Monocytes Relative: 8 % (ref 3–12)
Neutro Abs: 15.1 10*3/uL — ABNORMAL HIGH (ref 1.7–7.7)
Neutrophils Relative %: 83 % — ABNORMAL HIGH (ref 43–77)
Platelets: 294 10*3/uL (ref 150–400)
RBC: 2.97 MIL/uL — ABNORMAL LOW (ref 4.22–5.81)
RDW: 18.2 % — AB (ref 11.5–15.5)
WBC: 18.1 10*3/uL — ABNORMAL HIGH (ref 4.0–10.5)

## 2013-12-04 LAB — GLUCOSE, CAPILLARY
GLUCOSE-CAPILLARY: 172 mg/dL — AB (ref 70–99)
Glucose-Capillary: 175 mg/dL — ABNORMAL HIGH (ref 70–99)
Glucose-Capillary: 161 mg/dL — ABNORMAL HIGH (ref 70–99)
Glucose-Capillary: 167 mg/dL — ABNORMAL HIGH (ref 70–99)

## 2013-12-04 LAB — SAMPLE TO BLOOD BANK

## 2013-12-04 LAB — OCCULT BLOOD, POC DEVICE: Fecal Occult Bld: POSITIVE — AB

## 2013-12-04 LAB — PREPARE RBC (CROSSMATCH)

## 2013-12-04 LAB — TROPONIN I: Troponin I: <0.3 ng/mL (ref <0.30)

## 2013-12-04 SURGERY — IMAGING PROCEDURE, GI TRACT, INTRALUMINAL, VIA CAPSULE
Anesthesia: LOCAL

## 2013-12-04 MED ORDER — ALPRAZOLAM 0.25 MG PO TABS
0.2500 mg | ORAL_TABLET | Freq: Once | ORAL | Status: AC
  Administered 2013-12-04: 0.25 mg via ORAL
  Filled 2013-12-04: qty 1

## 2013-12-04 MED ORDER — IPRATROPIUM BROMIDE 0.02 % IN SOLN
0.5000 mg | Freq: Once | RESPIRATORY_TRACT | Status: AC
  Administered 2013-12-04: 0.5 mg via RESPIRATORY_TRACT
  Filled 2013-12-04: qty 2.5

## 2013-12-04 MED ORDER — HYDROCODONE-ACETAMINOPHEN 5-325 MG PO TABS
1.0000 | ORAL_TABLET | Freq: Four times a day (QID) | ORAL | Status: DC | PRN
  Administered 2013-12-04 – 2013-12-09 (×10): 1 via ORAL
  Filled 2013-12-04 (×12): qty 1

## 2013-12-04 MED ORDER — ACETAMINOPHEN 650 MG RE SUPP: 650.0000 mg | Freq: Four times a day (QID) | RECTAL | Status: DC | PRN

## 2013-12-04 MED ORDER — IPRATROPIUM BROMIDE 0.02 % IN SOLN: 0.5000 mg | Freq: Four times a day (QID) | RESPIRATORY_TRACT | Status: DC

## 2013-12-04 MED ORDER — ONDANSETRON HCL 4 MG/2ML IJ SOLN: 4.0000 mg | Freq: Four times a day (QID) | INTRAMUSCULAR | Status: DC | PRN

## 2013-12-04 MED ORDER — ONDANSETRON HCL 4 MG PO TABS
4.0000 mg | ORAL_TABLET | Freq: Four times a day (QID) | ORAL | Status: DC | PRN
  Administered 2013-12-09: 4 mg via ORAL
  Filled 2013-12-04: qty 1

## 2013-12-04 MED ORDER — ALBUTEROL SULFATE (2.5 MG/3ML) 0.083% IN NEBU: 2.5000 mg | INHALATION_SOLUTION | Freq: Four times a day (QID) | RESPIRATORY_TRACT | Status: DC

## 2013-12-04 MED ORDER — PANTOPRAZOLE SODIUM 40 MG IV SOLR
80.0000 mg | Freq: Once | INTRAVENOUS | Status: AC
  Administered 2013-12-04: 80 mg via INTRAVENOUS
  Filled 2013-12-04: qty 80

## 2013-12-04 MED ORDER — FENTANYL CITRATE 0.05 MG/ML IJ SOLN
12.5000 ug | INTRAMUSCULAR | Status: DC | PRN
  Administered 2013-12-04 (×2): 12.5 ug via INTRAVENOUS
  Filled 2013-12-04 (×3): qty 2

## 2013-12-04 MED ORDER — FUROSEMIDE 10 MG/ML IJ SOLN: 20.0000 mg | Freq: Once | INTRAMUSCULAR | Status: DC

## 2013-12-04 MED ORDER — ALBUTEROL SULFATE (2.5 MG/3ML) 0.083% IN NEBU: 2.5000 mg | INHALATION_SOLUTION | RESPIRATORY_TRACT | Status: DC | PRN

## 2013-12-04 MED ORDER — SODIUM CHLORIDE 0.9 % IJ SOLN
3.0000 mL | Freq: Two times a day (BID) | INTRAMUSCULAR | Status: DC
  Administered 2013-12-04 – 2013-12-10 (×11): 3 mL via INTRAVENOUS

## 2013-12-04 MED ORDER — PANTOPRAZOLE SODIUM 40 MG IV SOLR
8.0000 mg/h | INTRAVENOUS | Status: AC
  Administered 2013-12-04 – 2013-12-06 (×7): 8 mg/h via INTRAVENOUS
  Filled 2013-12-04 (×15): qty 80

## 2013-12-04 MED ORDER — SODIUM CHLORIDE 0.9 % IV SOLN
INTRAVENOUS | Status: AC
  Administered 2013-12-04 – 2013-12-05 (×2): via INTRAVENOUS

## 2013-12-04 MED ORDER — INSULIN ASPART 100 UNIT/ML ~~LOC~~ SOLN
0.0000 [IU] | Freq: Three times a day (TID) | SUBCUTANEOUS | Status: DC
  Administered 2013-12-04 (×2): 2 [IU] via SUBCUTANEOUS
  Administered 2013-12-05 (×2): 3 [IU] via SUBCUTANEOUS
  Administered 2013-12-05: 2 [IU] via SUBCUTANEOUS
  Administered 2013-12-06: 1 [IU] via SUBCUTANEOUS
  Administered 2013-12-06 – 2013-12-08 (×4): 2 [IU] via SUBCUTANEOUS
  Administered 2013-12-08: 3 [IU] via SUBCUTANEOUS
  Administered 2013-12-08 – 2013-12-09 (×3): 2 [IU] via SUBCUTANEOUS
  Administered 2013-12-09: 3 [IU] via SUBCUTANEOUS
  Administered 2013-12-10: 5 [IU] via SUBCUTANEOUS
  Administered 2013-12-10: 3 [IU] via SUBCUTANEOUS

## 2013-12-04 MED ORDER — HYDROMORPHONE HCL PF 1 MG/ML IJ SOLN
1.0000 mg | Freq: Once | INTRAMUSCULAR | Status: DC
  Filled 2013-12-04: qty 1

## 2013-12-04 MED ORDER — IPRATROPIUM-ALBUTEROL 0.5-2.5 (3) MG/3ML IN SOLN
3.0000 mL | Freq: Four times a day (QID) | RESPIRATORY_TRACT | Status: DC
  Administered 2013-12-04 – 2013-12-07 (×13): 3 mL via RESPIRATORY_TRACT
  Filled 2013-12-04 (×14): qty 3

## 2013-12-04 MED ORDER — ALBUTEROL (5 MG/ML) CONTINUOUS INHALATION SOLN
15.0000 mg/h | INHALATION_SOLUTION | RESPIRATORY_TRACT | Status: DC
  Administered 2013-12-04: 15 mg/h via RESPIRATORY_TRACT
  Filled 2013-12-04: qty 20

## 2013-12-04 MED ORDER — ZOLPIDEM TARTRATE 5 MG PO TABS
5.0000 mg | ORAL_TABLET | Freq: Every evening | ORAL | Status: DC | PRN
  Administered 2013-12-04: 5 mg via ORAL
  Filled 2013-12-04: qty 1

## 2013-12-04 MED ORDER — ACETAMINOPHEN 325 MG PO TABS
650.0000 mg | ORAL_TABLET | Freq: Four times a day (QID) | ORAL | Status: DC | PRN
  Administered 2013-12-05 – 2013-12-10 (×4): 650 mg via ORAL
  Filled 2013-12-04 (×4): qty 2

## 2013-12-04 MED ORDER — IOHEXOL 350 MG/ML SOLN
100.0000 mL | Freq: Once | INTRAVENOUS | Status: AC | PRN
  Administered 2013-12-04: 100 mL via INTRAVENOUS

## 2013-12-04 MED ORDER — PANTOPRAZOLE SODIUM 40 MG IV SOLR
40.0000 mg | Freq: Two times a day (BID) | INTRAVENOUS | Status: DC
  Administered 2013-12-07 – 2013-12-10 (×6): 40 mg via INTRAVENOUS
  Filled 2013-12-04 (×8): qty 40

## 2013-12-04 SURGICAL SUPPLY — 1 items: TOWEL COTTON PACK 4EA (MISCELLANEOUS) ×4 IMPLANT

## 2013-12-04 NOTE — ED Notes (Signed)
Bed: SU11 Expected date:  Expected time:  Means of arrival:  Comments: EMS GI Bleed
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
## 2013-12-04 NOTE — ED Notes (Signed)
Pt arrived via EMS c/o sob and gi bleed. Pt demands bedside commode on arrival. Dark tarry stool noted. Pt anxious, color pale skin cool and mottled and abdomen. Dr Tomi Bamberger to bedside.

## 2013-12-04 NOTE — ED Notes (Signed)
MD at bedside. Discuss possible change in bed status.
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
## 2013-12-04 NOTE — Progress Notes (Signed)
Went in to admit patient and found him agitated, anxious and cussing. Attempted to do admission work and ask questions and patient starts swearing and and said, "isn't that GD information in the computer?" I told him yes but that I had to do updates.  He then proceeded to tell me that he was feeling so bad that he wanted his wife to call the ambulance and she said that would cost $200.00 and why could he not go by car. Patient got frustrated and he reported that it took him to "shit" all over himself and that his "gerbil-headed" wife told him that she guess she would call the ambulance.

## 2013-12-04 NOTE — Progress Notes (Signed)
eLink Physician-Brief Progress Note Patient Name: [name] DOB: 1940-12-06 MRN: [id number]  Date of Service  12/04/2013   HPI/Events of Note   Requesting food Pill endoscopy started > 3 hours ago  eICU Interventions  clears   Intervention Category Minor Interventions: Routine modifications to care plan (e.g. PRN medications for pain, fever)  [place], [place] 12/04/2013, 8:20 PM

## 2013-12-04 NOTE — Progress Notes (Signed)
Patient currently in room belligirent. Patient is swearing and threatening staff. Has been threatening and swearing at staff since 0800. Patient has fired his  current MD. Patient is saying, "I am going to kick the MD's ass. I'm going to show him what a real navy seal can do." Told staff on several occasions to get the hell out of his room when trying to provide care. Nurse told patient of innapproriate behavior. Patient stated, " I don't care." Patient stating he wants something for pain but because of endo procedure to be done and patient allergy list only few drugs can be provided at this time. Patient is refusing the medications that are available to give to him. Patient states we are taking to long in providing him care. MD, charge nurse, student nurse, and  patient's RN have all rounded on patient several times since 0700 for inappropriate behavior.

## 2013-12-04 NOTE — Progress Notes (Signed)
eLink Physician-Brief Progress Note Patient Name: [name] DOB: October 23, 1940 MRN: [id number]  Date of Service  12/04/2013   HPI/Events of Note   Pain, wants home meds Insomnia eating  eICU Interventions  Restart home [place] Restart home vicodin   Intervention Category Intermediate Interventions: Pain - evaluation and management  Darice Vicario 12/04/2013, 7:25 PM

## 2013-12-04 NOTE — ED Notes (Signed)
Patient transported to CT 
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
## 2013-12-05 ENCOUNTER — Encounter (HOSPITAL_COMMUNITY): Payer: Self-pay | Admitting: Gastroenterology

## 2013-12-05 DIAGNOSIS — I714 Abdominal aortic aneurysm, without rupture, unspecified: Secondary | ICD-10-CM

## 2013-12-05 LAB — GLUCOSE, CAPILLARY
GLUCOSE-CAPILLARY: 189 mg/dL — AB (ref 70–99)
GLUCOSE-CAPILLARY: 211 mg/dL — AB (ref 70–99)
GLUCOSE-CAPILLARY: 182 mg/dL — AB (ref 70–99)
GLUCOSE-CAPILLARY: 207 mg/dL — AB (ref 70–99)
Glucose-Capillary: 245 mg/dL — ABNORMAL HIGH (ref 70–99)
Glucose-Capillary: 184 mg/dL — ABNORMAL HIGH (ref 70–99)
Glucose-Capillary: 208 mg/dL — ABNORMAL HIGH (ref 70–99)

## 2013-12-05 MED ORDER — ZOLPIDEM TARTRATE 10 MG PO TABS
10.0000 mg | ORAL_TABLET | Freq: Every evening | ORAL | Status: DC | PRN
  Administered 2013-12-05 – 2013-12-09 (×5): 10 mg via ORAL
  Filled 2013-12-05 (×5): qty 1

## 2013-12-05 MED ORDER — ALPRAZOLAM 0.25 MG PO TABS
ORAL_TABLET | ORAL | Status: AC
  Filled 2013-12-05: qty 1

## 2013-12-05 MED ORDER — ALPRAZOLAM 0.25 MG PO TABS
0.2500 mg | ORAL_TABLET | Freq: Three times a day (TID) | ORAL | Status: DC | PRN
  Administered 2013-12-05 – 2013-12-09 (×6): 0.25 mg via ORAL
  Filled 2013-12-05 (×6): qty 1

## 2013-12-05 MED ORDER — BUDESONIDE-FORMOTEROL FUMARATE 160-4.5 MCG/ACT IN AERO
2.0000 | INHALATION_SPRAY | Freq: Two times a day (BID) | RESPIRATORY_TRACT | Status: DC
Start: 2013-12-05 — End: 2013-12-10
  Administered 2013-12-05 – 2013-12-10 (×9): 2 via RESPIRATORY_TRACT
  Filled 2013-12-05: qty 6

## 2013-12-05 NOTE — Progress Notes (Signed)
:    [id number] Rosana Hoes, RN, BSN, CCM, [id number] Chart reviewed for update of needs and condition.  MD discussed and encouraged the patient to be calmer.  The capsule endoscopy was performed expects  results tomorrow.

## 2013-12-05 NOTE — Progress Notes (Signed)
Subjective: Feeling better this morning.  Hungry.  Objective: Vital signs in last 24 hours: Temp:  [97.4 F (36.3 C)-98.5 F (36.9 C)] 97.4 F (36.3 C) (02/19 0400) Pulse Rate:  [98-129] 99 (02/19 0400) Resp:  [13-24] 18 (02/19 0400) BP: (99-146)/(39-86) 125/73 mmHg (02/19 0400) SpO2:  [93 %-99 %] 94 % (02/19 0400) Weight:  [262 lb (118.842 kg)-276 lb 3.8 oz (125.3 kg)] 276 lb 3.8 oz (125.3 kg) (02/19 0400) Last BM Date: 12/04/13  Intake/Output from previous day: 02/18 0701 - 02/19 0700 In: 3030 [P.O.:360; I.V.:2050; Blood:620] Out: 1200 [Urine:1200] Intake/Output this shift: Total I/O In: 13.3 [I.V.:13.3] Out: -   General appearance: alert and no distress GI: soft, non-tender; bowel sounds normal; no masses,  no organomegaly  Lab Results:  Recent Labs  12/04/13 0125 12/04/13 1920  WBC 18.1* 10.7*  HGB 7.1* 8.2*  HCT 25.3* 26.7*  PLT 294 228   BMET  Recent Labs  12/04/13 0125  NA 137  K 5.1  CL 102  CO2 24  GLUCOSE 220*  BUN 35*  CREATININE 1.00  CALCIUM 8.6   LFT No results found for this basename: PROT, ALBUMIN, AST, ALT, ALKPHOS, BILITOT, BILIDIR, IBILI,  in the last 72 hours PT/INR No results found for this basename: LABPROT, INR,  in the last 72 hours Hepatitis Panel No results found for this basename: HEPBSAG, HCVAB, HEPAIGM, HEPBIGM,  in the last 72 hours C-Diff No results found for this basename: CDIFFTOX,  in the last 72 hours Fecal Lactopherrin No results found for this basename: FECLLACTOFRN,  in the last 72 hours  Studies/Results: Ct Angio Abdomen W/cm &/or Wo Contrast  12/04/2013   CLINICAL DATA:  Abdominal pain.  History of aneurysm.  EXAM: CT ANGIOGRAPHY ABDOMEN  TECHNIQUE: Multidetector CT imaging of the abdomen was performed using the standard protocol during bolus administration of intravenous contrast. Multiplanar reconstructed images including MIPs were obtained and reviewed to evaluate the vascular anatomy.  CONTRAST:  141mL  OMNIPAQUE IOHEXOL 350 MG/ML SOLN  COMPARISON:  CT ANGIO ABDOMEN W/CM &/OR WO/CM dated 12/22/2009  FINDINGS: There is a large hiatal hernia containing proximal stomach and intraperitoneal fat. Heart is normal size. No confluent airspace opacities in the lung bases. No effusions. Calcifications noted in the visualize right coronary artery.  Liver, gallbladder, spleen, pancreas, adrenals and kidneys are unremarkable.  Patient is status post stent graft repair of an abdominal aortic aneurysm. Aneurysm sac size measures 5.1 cm maximally compared to 5.5 cm previously. No evidence of endoleak. Mesenteric vessels and renal arteries are patent without focal stenosis.  Descending colonic and sigmoid diverticulosis. No active diverticulitis. Small bowel is decompressed. No free fluid, free air or adenopathy.  No acute bony abnormality.  Review of the MIP images confirms the above findings.  IMPRESSION: Prior stent graft repair of an abdominal aortic aneurysm. Slight decreased size of the aneurysm sac. No evidence of endoleak.  Left colonic diverticulosis.  Large hiatal hernia.  Coronary artery disease.   Electronically Signed   By: Rolm Baptise M.D.   On: 12/04/2013 06:04   Dg Chest Portable 1 View  12/04/2013   CLINICAL DATA:  Worsening shortness of breath, history of COPD.  EXAM: PORTABLE CHEST - 1 VIEW  COMPARISON:  Chest radiograph October 15, 2013.  FINDINGS: Cardiac silhouette remains enlarged, mediastinal silhouette is nonsuspicious, status post median sternotomy. Mild chronic interstitial changes without pleural effusions or focal consolidations. Apical bullous changes. Hiatal hernia. No pneumothorax.  Remote right posterior rib fractures. Soft tissue  planes are nonsuspicious. Multiple EKG lines overlie the patient and may obscure subtle underlying pathology.  IMPRESSION: Stable cardiomegaly and chronic interstitial changes without superimposed acute pulmonary process.   Electronically Signed   By: Elon Alas    On: 12/04/2013 02:30    Medications:  Scheduled: .  HYDROmorphone (DILAUDID) injection  1 mg Intravenous Once  . insulin aspart  0-9 Units Subcutaneous TID WC  . ipratropium-albuterol  3 mL Nebulization Q6H  . [START ON 12/07/2013] pantoprazole (PROTONIX) IV  40 mg Intravenous Q12H  . sodium chloride  3 mL Intravenous Q12H   Continuous: . pantoprozole (PROTONIX) infusion 8 mg/hr (12/05/13 0058)    Assessment/Plan: 1) GI Bleed - ? Source. 2) AAA.   The patient is well this AM.  I noted the interactions that were documented yesterday and I encouraged the patient to be calmer.  The capsule endoscopy was performed and hopefully I will be able to read the results tomorrow.  I will advance his diet.  ABM pain resolved.  Plan: 1) Diabetic diet. 2) Monitor CBC and transfuse as necessary.  LOS: 1 day   Zaire Vanbuskirk D 12/05/2013, 8:16 AM

## 2013-12-05 NOTE — Progress Notes (Signed)
Pt asking for graham crackers and peanutbutter.  Spoke with pt about his high blood sugar and he replied that at his age he  didn't care  about his sugar.  He said no other doctor had ever put him on a special diet and he eats whatever he wants to at home.  Informed of  closer glucose control while in hospital, and need  To keep glucose under control to prevent other complications.

## 2013-12-05 NOTE — Progress Notes (Signed)
Inpatient Diabetes Program Recommendations  AACE/ADA: New Consensus Statement on Inpatient Glycemic Control (2013)  Target Ranges:  Prepandial:   less than 140 mg/dL      Peak postprandial:   less than 180 mg/dL (1-2 hours)      Critically ill patients:  140 - 180 mg/dL   Reason for Visit: Hyperglycemia  Diabetes history: Type 2 Outpatient Diabetes medications: metformin 500 bid Current orders for Inpatient glycemic control: Novolog sensitive tidwc  Results for [name], [name] (MRN [id number]) as of 12/05/2013 12:45  Ref. Range 12/04/2013 20:33 12/04/2013 23:19 12/05/2013 04:01 12/05/2013 08:37  Glucose-Capillary Latest Range: 70-99 mg/dL 167 (H) 245 (H) 189 (H) 211 (H)  Results for [name], [name] (MRN [id number]) as of 12/05/2013 12:45  Ref. Range 09/08/2013 01:01  Hemoglobin A1C Latest Range: <5.7 % 6.5 (H)    Inpatient Diabetes Program Recommendations Correction (SSI): Increase Novolog to resistant tidwc and hs HgbA1C: 6.5% on 09/08/2013. Need updated HgbA1C.  Thank you. Lorenda Peck, RD, LDN, CDE Inpatient Diabetes Coordinator [phone]

## 2013-12-05 NOTE — Progress Notes (Signed)
[facility] PROGRESS NOTE  [name] SEG:[id number] DOB: August 21, 1941 DOA: 12/04/2013 PCP: Arnette Norris, MD  Assessment/Plan: 1. Upper GI bleed 1. S/p 2 units prbc transfusion 2. hgb stable 3. Cont monitor cbc and tx as needed 4. GI following - for capsule study 5. Recs for Heme-onc consult 2. Hx CAD 1. Stable 3. Hx AAA 1. Appears stable 2. Monitor 4. Hx COPD 1. On minimal O2 support 2. Cont current regimen 5. DVT prophylaxis 1. SCD's  Code Status: Full Family Communication: Pt in room (indicate person spoken with, relationship, and if by phone, the number) Disposition Plan: Pending   Consultants:  GI  Procedures:    Antibiotics:    HPI/Subjective: No acute events overnight  Objective: Filed Vitals:   12/05/13 0000 12/05/13 0001 12/05/13 0232 12/05/13 0400  BP:  110/67  125/73  Pulse:  103  99  Temp: 97.9 F (36.6 C)   97.4 F (36.3 C)  TempSrc: Oral   Oral  Resp:  14  18  Height:      Weight:    125.3 kg (276 lb 3.8 oz)  SpO2:  99% 93% 94%    Intake/Output Summary (Last 24 hours) at 12/05/13 0752 Last data filed at 12/05/13 0716  Gross per 24 hour  Intake 3043.33 ml  Output   1200 ml  Net 1843.33 ml   Filed Weights   12/04/13 0609 12/04/13 1709 12/05/13 0400  Weight: 119.2 kg (262 lb 12.6 oz) 118.842 kg (262 lb) 125.3 kg (276 lb 3.8 oz)    Exam:   General:  Awake, in nad  Cardiovascular: regular, s1, s2  Respiratory: normal resp effort, no wheezing  Abdomen: soft, nondistended  Musculoskeletal: perfused, no clubbing   Data Reviewed: Basic Metabolic Panel:  Recent Labs Lab 12/04/13 0125  NA 137  K 5.1  CL 102  CO2 24  GLUCOSE 220*  BUN 35*  CREATININE 1.00  CALCIUM 8.6   Liver Function Tests: No results found for this basename: AST, ALT, ALKPHOS, BILITOT, PROT, ALBUMIN,  in the last 168 hours No results found for this basename: LIPASE, AMYLASE,  in the last 168 hours No results found for this basename:  AMMONIA,  in the last 168 hours CBC:  Recent Labs Lab 12/04/13 0125 12/04/13 1920  WBC 18.1* 10.7*  NEUTROABS 15.1*  --   HGB 7.1* 8.2*  HCT 25.3* 26.7*  MCV 85.2 83.7  PLT 294 228   Cardiac Enzymes:  Recent Labs Lab 12/04/13 0545  TROPONINI <0.30   BNP (last 3 results)  Recent Labs  08/19/13 1531 09/07/13 2020 10/15/13 1534  PROBNP 596.9* 293.6* 487.1*   CBG:  Recent Labs Lab 12/04/13 0835 12/04/13 1147 12/04/13 1620 12/04/13 2033 12/04/13 2319  GLUCAP 172* 175* 161* 167* 245*    Recent Results (from the past 240 hour(s))  MRSA PCR SCREENING     Status: None   Collection Time    12/04/13  6:15 AM      Result Value Ref Range Status   MRSA by PCR NEGATIVE  NEGATIVE Final   Comment:            The GeneXpert MRSA Assay (FDA     approved for NASAL specimens     only), is one component of a     comprehensive MRSA colonization     surveillance program. It is not     intended to diagnose MRSA     infection nor to guide or  monitor treatment for     MRSA infections.     Studies: Ct Angio Abdomen W/cm &/or Wo Contrast  12/04/2013   CLINICAL DATA:  Abdominal pain.  History of aneurysm.  EXAM: CT ANGIOGRAPHY ABDOMEN  TECHNIQUE: Multidetector CT imaging of the abdomen was performed using the standard protocol during bolus administration of intravenous contrast. Multiplanar reconstructed images including MIPs were obtained and reviewed to evaluate the vascular anatomy.  CONTRAST:  173mL OMNIPAQUE IOHEXOL 350 MG/ML SOLN  COMPARISON:  CT ANGIO ABDOMEN W/CM &/OR WO/CM dated 12/22/2009  FINDINGS: There is a large hiatal hernia containing proximal stomach and intraperitoneal fat. Heart is normal size. No confluent airspace opacities in the lung bases. No effusions. Calcifications noted in the visualize right coronary artery.  Liver, gallbladder, spleen, pancreas, adrenals and kidneys are unremarkable.  Patient is status post stent graft repair of an abdominal aortic  aneurysm. Aneurysm sac size measures 5.1 cm maximally compared to 5.5 cm previously. No evidence of endoleak. Mesenteric vessels and renal arteries are patent without focal stenosis.  Descending colonic and sigmoid diverticulosis. No active diverticulitis. Small bowel is decompressed. No free fluid, free air or adenopathy.  No acute bony abnormality.  Review of the MIP images confirms the above findings.  IMPRESSION: Prior stent graft repair of an abdominal aortic aneurysm. Slight decreased size of the aneurysm sac. No evidence of endoleak.  Left colonic diverticulosis.  Large hiatal hernia.  Coronary artery disease.   Electronically Signed   By: Rolm Baptise M.D.   On: 12/04/2013 06:04   Dg Chest Portable 1 View  12/04/2013   CLINICAL DATA:  Worsening shortness of breath, history of COPD.  EXAM: PORTABLE CHEST - 1 VIEW  COMPARISON:  Chest radiograph October 15, 2013.  FINDINGS: Cardiac silhouette remains enlarged, mediastinal silhouette is nonsuspicious, status post median sternotomy. Mild chronic interstitial changes without pleural effusions or focal consolidations. Apical bullous changes. Hiatal hernia. No pneumothorax.  Remote right posterior rib fractures. Soft tissue planes are nonsuspicious. Multiple EKG lines overlie the patient and may obscure subtle underlying pathology.  IMPRESSION: Stable cardiomegaly and chronic interstitial changes without superimposed acute pulmonary process.   Electronically Signed   By: Elon Alas   On: 12/04/2013 02:30    Scheduled Meds: .  HYDROmorphone (DILAUDID) injection  1 mg Intravenous Once  . insulin aspart  0-9 Units Subcutaneous TID WC  . ipratropium-albuterol  3 mL Nebulization Q6H  . [START ON 12/07/2013] pantoprazole (PROTONIX) IV  40 mg Intravenous Q12H  . sodium chloride  3 mL Intravenous Q12H   Continuous Infusions: . pantoprozole (PROTONIX) infusion 8 mg/hr (12/05/13 0058)    Principal Problem:   GI bleeding Active Problems:   DM    Anemia   CAD (coronary artery disease)   AAA (abdominal aortic aneurysm)   Chronic hypoxemic respiratory failure   GI bleed   Abdominal pain   Leucocytosis  Time spent: 25min  Rakiyah Esch, [place] [facility] Pager [phone]. If 7PM-7AM, please contact night-coverage at [email or web address], password [facility] 12/05/2013, 7:52 AM  LOS: 1 day

## 2013-12-05 NOTE — Progress Notes (Signed)
[facility] Progress Note Patient Name: [name] DOB: 27-Dec-1940 MRN: [id number]  Date of Service  12/05/2013   HPI/Events of Note   Patient requests home symbicort and increased dose of ambien  eICU Interventions  done   Intervention Category Minor Interventions: Routine modifications to care plan (e.g. PRN medications for pain, fever)  MCQUAID, DOUGLAS 12/05/2013, 8:37 PM

## 2013-12-06 ENCOUNTER — Inpatient Hospital Stay (HOSPITAL_COMMUNITY): Payer: Medicare PPO

## 2013-12-06 LAB — CBC
HEMATOCRIT: 24.2 % — AB (ref 39.0–52.0)
Hemoglobin: 7.3 g/dL — ABNORMAL LOW (ref 13.0–17.0)
MCH: 25.9 pg — ABNORMAL LOW (ref 26.0–34.0)
MCHC: 30.2 g/dL (ref 30.0–36.0)
MCV: 85.8 fL (ref 78.0–100.0)
Platelets: 166 10*3/uL (ref 150–400)
RBC: 2.82 MIL/uL — AB (ref 4.22–5.81)
RDW: 18.8 % — ABNORMAL HIGH (ref 11.5–15.5)
WBC: 8.6 10*3/uL (ref 4.0–10.5)

## 2013-12-06 LAB — HEMOGLOBIN AND HEMATOCRIT, BLOOD
HCT: 29.8 % — ABNORMAL LOW (ref 39.0–52.0)
Hemoglobin: 9.2 g/dL — ABNORMAL LOW (ref 13.0–17.0)

## 2013-12-06 LAB — GLUCOSE, CAPILLARY
Glucose-Capillary: 154 mg/dL — ABNORMAL HIGH (ref 70–99)
Glucose-Capillary: 148 mg/dL — ABNORMAL HIGH (ref 70–99)

## 2013-12-06 LAB — PREPARE RBC (CROSSMATCH)

## 2013-12-06 MED ORDER — TECHNETIUM TC 99M-LABELED RED BLOOD CELLS IV KIT
22.0000 | PACK | Freq: Once | INTRAVENOUS | Status: AC | PRN
  Administered 2013-12-06: 22 via INTRAVENOUS

## 2013-12-06 MED ORDER — PEG 3350-KCL-NA BICARB-NACL 420 G PO SOLR
4000.0000 mL | Freq: Once | ORAL | Status: AC
  Administered 2013-12-06: 4000 mL via ORAL
  Filled 2013-12-06: qty 4000

## 2013-12-06 NOTE — Progress Notes (Signed)
I read the patient's capsule endoscopy today.  There is evidence of melena in the colon and no evidence of any melena elsewhere.  The site if bleeding is most likely from the colon.  His HGB has also dropped.  I will order a bleeding scan as well as set him up for a colonoscopy tomorrow.  The bleeding scan may be redundant, but the recurrent bleeding issue has been a significant problem.  I have performed a couple of colonoscopies on him in the past without any overt site of bleeding. 

## 2013-12-06 NOTE — Progress Notes (Signed)
[facility] PROGRESS NOTE  AEDYN KEMPFER STM:[id number] DOB: November 08, 1940 DOA: 12/04/2013 PCP: Arnette Norris, MD  Assessment/Plan: 1. Upper GI bleed 1. S/p 2 units prbc transfusion 2. hgb 7.3 today - will transfuse 2 units prbc's 3. GI following -capsule study reviewed with GI over phone - appears to be colonic source of bleed 4. Possible colonoscopy tomorrow 5. Bleeding scan ordered, pending 2. Hx CAD 1. Stable 3. Hx AAA 1. Appears stable 2. Monitor 4. Hx COPD 1. On minimal O2 support 2. Cont current regimen 5. DVT prophylaxis 1. SCD's  Code Status: Full Family Communication: Pt in room (indicate person spoken with, relationship, and if by phone, the number) Disposition Plan: Pending  Consultants:  GI  Procedures:  Capsule endoscopy 12/05/13  Antibiotics:    HPI/Subjective: No acute events overnight  Objective: Filed Vitals:   12/06/13 0000 12/06/13 0235 12/06/13 0400 12/06/13 0700  BP: 121/56  158/73   Pulse: 95  93   Temp: 97.7 F (36.5 C)  98.2 F (36.8 C) 98.2 F (36.8 C)  TempSrc: Oral  Oral Oral  Resp: 18  16   Height:      Weight:   128.3 kg (282 lb 13.6 oz)   SpO2: 96% 97% 98%     Intake/Output Summary (Last 24 hours) at 12/06/13 0803 Last data filed at 12/06/13 0800  Gross per 24 hour  Intake   1835 ml  Output   1050 ml  Net    785 ml   Filed Weights   12/04/13 1709 12/05/13 0400 12/06/13 0400  Weight: 118.842 kg (262 lb) 125.3 kg (276 lb 3.8 oz) 128.3 kg (282 lb 13.6 oz)    Exam:   General:  Awake, in nad  Cardiovascular: regular, s1, s2  Respiratory: normal resp effort, no wheezing  Abdomen: soft, nondistended  Musculoskeletal: perfused, no clubbing   Data Reviewed: Basic Metabolic Panel:  Recent Labs Lab 12/04/13 0125  NA 137  K 5.1  CL 102  CO2 24  GLUCOSE 220*  BUN 35*  CREATININE 1.00  CALCIUM 8.6   Liver Function Tests: No results found for this basename: AST, ALT, ALKPHOS, BILITOT, PROT, ALBUMIN,   in the last 168 hours No results found for this basename: LIPASE, AMYLASE,  in the last 168 hours No results found for this basename: AMMONIA,  in the last 168 hours CBC:  Recent Labs Lab 12/04/13 0125 12/04/13 1920 12/06/13 0318  WBC 18.1* 10.7* 8.6  NEUTROABS 15.1*  --   --   HGB 7.1* 8.2* 7.3*  HCT 25.3* 26.7* 24.2*  MCV 85.2 83.7 85.8  PLT 294 228 166   Cardiac Enzymes:  Recent Labs Lab 12/04/13 0545  TROPONINI <0.30   BNP (last 3 results)  Recent Labs  08/19/13 1531 09/07/13 2020 10/15/13 1534  PROBNP 596.9* 293.6* 487.1*   CBG:  Recent Labs Lab 12/05/13 1224 12/05/13 1632 12/05/13 1954 12/05/13 2129 12/06/13 0726  GLUCAP 184* 208* 182* 207* 154*    Recent Results (from the past 240 hour(s))  MRSA PCR SCREENING     Status: None   Collection Time    12/04/13  6:15 AM      Result Value Ref Range Status   MRSA by PCR NEGATIVE  NEGATIVE Final   Comment:            The GeneXpert MRSA Assay (FDA     approved for NASAL specimens     only), is one component of a  comprehensive MRSA colonization     surveillance program. It is not     intended to diagnose MRSA     infection nor to guide or     monitor treatment for     MRSA infections.     Studies: No results found.  Scheduled Meds: . budesonide-formoterol  2 puff Inhalation BID  .  HYDROmorphone (DILAUDID) injection  1 mg Intravenous Once  . insulin aspart  0-9 Units Subcutaneous TID WC  . ipratropium-albuterol  3 mL Nebulization Q6H  . [START ON 12/07/2013] pantoprazole (PROTONIX) IV  40 mg Intravenous Q12H  . sodium chloride  3 mL Intravenous Q12H   Continuous Infusions: . pantoprozole (PROTONIX) infusion 8 mg/hr (12/06/13 0657)    Principal Problem:   GI bleeding Active Problems:   DM   Anemia   CAD (coronary artery disease)   AAA (abdominal aortic aneurysm)   Chronic hypoxemic respiratory failure   GI bleed   Abdominal pain   Leucocytosis  Time spent: 51min  CH[facility], [place] [facility] Pager [phone]. If 7PM-7AM, please contact night-coverage at [email or web address], password [facility] 12/06/2013, 8:03 AM  LOS: 2 days

## 2013-12-07 ENCOUNTER — Encounter (HOSPITAL_COMMUNITY): Payer: Self-pay | Admitting: *Deleted

## 2013-12-07 ENCOUNTER — Encounter (HOSPITAL_COMMUNITY)
Admission: EM | Disposition: A | Discharge status: Home Health | Payer: Self-pay | Source: Home / Self Care | Attending: Internal Medicine

## 2013-12-07 ENCOUNTER — Inpatient Hospital Stay (HOSPITAL_COMMUNITY): Payer: Medicare PPO

## 2013-12-07 DIAGNOSIS — K5520 Angiodysplasia of colon without hemorrhage: Secondary | ICD-10-CM

## 2013-12-07 DIAGNOSIS — Q2733 Arteriovenous malformation of digestive system vessel: Secondary | ICD-10-CM | POA: Insufficient documentation

## 2013-12-07 HISTORY — PX: COLONOSCOPY: SHX5424

## 2013-12-07 LAB — TYPE AND SCREEN
ABO/RH(D): B NEG
Antibody Screen: NEGATIVE
UNIT DIVISION: 0
UNIT DIVISION: 0
UNIT DIVISION: 0
Unit division: 0

## 2013-12-07 LAB — GLUCOSE, CAPILLARY
GLUCOSE-CAPILLARY: 159 mg/dL — AB (ref 70–99)
GLUCOSE-CAPILLARY: 169 mg/dL — AB (ref 70–99)
Glucose-Capillary: 144 mg/dL — ABNORMAL HIGH (ref 70–99)
Glucose-Capillary: 146 mg/dL — ABNORMAL HIGH (ref 70–99)

## 2013-12-07 SURGERY — COLONOSCOPY
Anesthesia: Moderate Sedation

## 2013-12-07 MED ORDER — FENTANYL CITRATE 0.05 MG/ML IJ SOLN
INTRAMUSCULAR | Status: AC
  Filled 2013-12-07: qty 2

## 2013-12-07 MED ORDER — FENTANYL CITRATE 0.05 MG/ML IJ SOLN
INTRAMUSCULAR | Status: DC | PRN
  Administered 2013-12-07 (×3): 25 ug via INTRAVENOUS

## 2013-12-07 MED ORDER — DIPHENHYDRAMINE HCL 50 MG/ML IJ SOLN
INTRAMUSCULAR | Status: AC
  Filled 2013-12-07: qty 1

## 2013-12-07 MED ORDER — ALPRAZOLAM 0.25 MG PO TABS
0.2500 mg | ORAL_TABLET | ORAL | Status: AC
  Administered 2013-12-07: 0.25 mg via ORAL
  Filled 2013-12-07: qty 1

## 2013-12-07 MED ORDER — MIDAZOLAM HCL 10 MG/2ML IJ SOLN
INTRAMUSCULAR | Status: AC
  Filled 2013-12-07: qty 2

## 2013-12-07 MED ORDER — MIDAZOLAM HCL 5 MG/5ML IJ SOLN
INTRAMUSCULAR | Status: DC | PRN
  Administered 2013-12-07: 2 mg via INTRAVENOUS
  Administered 2013-12-07: 1 mg via INTRAVENOUS
  Administered 2013-12-07 (×2): 2 mg via INTRAVENOUS

## 2013-12-07 MED ORDER — SODIUM CHLORIDE 0.9 % IV SOLN: INTRAVENOUS | Status: DC

## 2013-12-07 MED ORDER — HYDROCODONE-ACETAMINOPHEN 5-325 MG PO TABS: 1.0000 | ORAL_TABLET | Freq: Once | ORAL | Status: DC

## 2013-12-07 NOTE — H&P (View-Only) (Signed)
I read the patient's capsule endoscopy today.  There is evidence of melena in the colon and no evidence of any melena elsewhere.  The site if bleeding is most likely from the colon.  His HGB has also dropped.  I will order a bleeding scan as well as set him up for a colonoscopy tomorrow.  The bleeding scan may be redundant, but the recurrent bleeding issue has been a significant problem.  I have performed a couple of colonoscopies on him in the past without any overt site of bleeding.

## 2013-12-07 NOTE — Progress Notes (Signed)
[facility] PROGRESS NOTE  [name] QJJ:[id number] DOB: 04-11-1941 DOA: 12/04/2013 PCP: Arnette Norris, MD  Assessment/Plan: 1. Upper GI bleed 1. S/p another 2 units prbc transfusion yesterday with appropriate response 2. GI following -melena noted in stool via capsule study 3. Neg bleeding scan 4. For colonoscopy today 2. Hx CAD 1. Stable 3. Hx AAA 1. Appears stable 2. Monitor 4. Hx COPD 1. On minimal O2 support 2. Cont current regimen 5. DVT prophylaxis 1. SCD's  Code Status: Full Family Communication: Pt in room (indicate person spoken with, relationship, and if by phone, the number) Disposition Plan: Pending  Consultants:  GI  Procedures:  Capsule endoscopy 12/05/13  Bleeding scan 12/06/13  Antibiotics:    HPI/Subjective: No acute events overnight  Objective: Filed Vitals:   12/07/13 0200 12/07/13 0201 12/07/13 0300 12/07/13 0400  BP: 149/90  145/81 124/65  Pulse: 107  95 97  Temp:    97 F (36.1 C)  TempSrc:    Oral  Resp: 16  17 17   Height:      Weight:  123.8 kg (272 lb 14.9 oz)    SpO2: 93%  100% 96%    Intake/Output Summary (Last 24 hours) at 12/07/13 0745 Last data filed at 12/07/13 0700  Gross per 24 hour  Intake 1897.5 ml  Output   1750 ml  Net  147.5 ml   Filed Weights   12/05/13 0400 12/06/13 0400 12/07/13 0201  Weight: 125.3 kg (276 lb 3.8 oz) 128.3 kg (282 lb 13.6 oz) 123.8 kg (272 lb 14.9 oz)    Exam:   General:  Awake, in nad  Cardiovascular: regular, s1, s2  Respiratory: normal resp effort, no wheezing  Abdomen: soft, nondistended  Musculoskeletal: perfused, no clubbing   Data Reviewed: Basic Metabolic Panel:  Recent Labs Lab 12/04/13 0125  NA 137  K 5.1  CL 102  CO2 24  GLUCOSE 220*  BUN 35*  CREATININE 1.00  CALCIUM 8.6   Liver Function Tests: No results found for this basename: AST, ALT, ALKPHOS, BILITOT, PROT, ALBUMIN,  in the last 168 hours No results found for this basename: LIPASE,  AMYLASE,  in the last 168 hours No results found for this basename: AMMONIA,  in the last 168 hours CBC:  Recent Labs Lab 12/04/13 0125 12/04/13 1920 12/06/13 0318 12/06/13 2248  WBC 18.1* 10.7* 8.6  --   NEUTROABS 15.1*  --   --   --   HGB 7.1* 8.2* 7.3* 9.2*  HCT 25.3* 26.7* 24.2* 29.8*  MCV 85.2 83.7 85.8  --   PLT 294 228 166  --    Cardiac Enzymes:  Recent Labs Lab 12/04/13 0545  TROPONINI <0.30   BNP (last 3 results)  Recent Labs  08/19/13 1531 09/07/13 2020 10/15/13 1534  PROBNP 596.9* 293.6* 487.1*   CBG:  Recent Labs Lab 12/05/13 1632 12/05/13 1954 12/05/13 2129 12/06/13 0726 12/06/13 1536  GLUCAP 208* 182* 207* 154* 148*    Recent Results (from the past 240 hour(s))  MRSA PCR SCREENING     Status: None   Collection Time    12/04/13  6:15 AM      Result Value Ref Range Status   MRSA by PCR NEGATIVE  NEGATIVE Final   Comment:            The GeneXpert MRSA Assay (FDA     approved for NASAL specimens     only), is one component of a     comprehensive MRSA  colonization     surveillance program. It is not     intended to diagnose MRSA     infection nor to guide or     monitor treatment for     MRSA infections.     Studies: Nm Gi Blood Loss  12/06/2013   CLINICAL DATA:  Gastrointestinal bleeding.  EXAM: NUCLEAR MEDICINE GASTROINTESTINAL BLEEDING SCAN  TECHNIQUE: Sequential abdominal images were obtained following intravenous administration of Tc-19m labeled red blood cells.  COMPARISON:  CT abdomen and pelvis 12/04/2013.  RADIOPHARMACEUTICALS:  83.1 MILLICURIE ULTRATAG TECHNETIUM TC 28M-LABELED RED BLOOD CELLS IV KITmCi Tc-47m in-vitro labeled red cells.  FINDINGS: No evidence of gastrointestinal bleeding is identified. Soft tissue and vascular structures demonstrate normal activity.  IMPRESSION: Negative for GI bleed.   Electronically Signed   By: Inge Rise M.D.   On: 12/06/2013 15:26    Scheduled Meds: . budesonide-formoterol  2 puff  Inhalation BID  .  HYDROmorphone (DILAUDID) injection  1 mg Intravenous Once  . insulin aspart  0-9 Units Subcutaneous TID WC  . ipratropium-albuterol  3 mL Nebulization Q6H  . pantoprazole (PROTONIX) IV  40 mg Intravenous Q12H  . sodium chloride  3 mL Intravenous Q12H   Continuous Infusions:    Principal Problem:   GI bleeding Active Problems:   DM   Anemia   CAD (coronary artery disease)   AAA (abdominal aortic aneurysm)   Chronic hypoxemic respiratory failure   GI bleed   Abdominal pain   Leucocytosis  Time spent: 70min  Daijah Scrivens, [place] [facility] Pager [phone]. If 7PM-7AM, please contact night-coverage at [email or web address], password [facility] 12/07/2013, 7:45 AM  LOS: 3 days

## 2013-12-07 NOTE — Op Note (Signed)
[facility] [place], [address]   COLONOSCOPY PROCEDURE REPORT  PATIENT: [name], [name]  MR#: [id number] BIRTHDATE: 10/22/1940 , 73  yrs. old GENDER: Male ENDOSCOPIST: Inda Castle, MD REFERRED PF:[id number] Aron, M.D.  Carol Ada, M.D. PROCEDURE DATE:  12/07/2013 PROCEDURE:   Colonoscopy with tissue ablation ASA CLASS:   Class III INDICATIONS:recurrent GI bleeding.  capsule endoscopy Showed Blood in the Colon. MEDICATIONS: These medications were titrated to patient response per physician's verbal order, Versed 7 mg IV, and Fentanyl 75 mcg IV  DESCRIPTION OF PROCEDURE:   After the risks benefits and alternatives of the procedure were thoroughly explained, informed consent was obtained.  A digital rectal exam revealed no abnormalities of the rectum.   The     endoscope was introduced through the anus and advanced to the cecum, which was identified by both the appendix and ileocecal valve. No adverse events experienced.   The quality of the prep was Miralax fair  The instrument was then slowly withdrawn as the colon was fully examined.      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  The AVMs were fulgurated utilizing the APC.  There was a flash of blood with initial application of the APC typical for an AVM.  A  2 mm AVM was seen in the sigmoid colon and was also cauterized with the APC.    Mild diverticulosis was noted in the sigmoid colon.   A normal appearing cecum, ileocecal valve, and appendiceal orifice were identified.  The ascending, hepatic flexure, transverse, splenic flexure, descending, sigmoid colon and rectum appeared unremarkable.  No polyps or cancers were seen. There was no fresh or old blood. Retroflexed views revealed no abnormalities. The time to cecum=  .  Withdrawal time=14 minutes 0 seconds.  The scope was withdrawn and the procedure completed. COMPLICATIONS: There were  no complications.  ENDOSCOPIC IMPRESSION: 1.   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   Mild diverticulosis was noted in the sigmoid colon 3.   Normal colon  RECOMMENDATIONS: t/c double balloon enteroscopy  eSigned:  Inda Castle, MD 12/07/2013 9:18 AM   cc:   PATIENT NAME:  [name], [name] MR#: [id number]

## 2013-12-07 NOTE — Interval H&P Note (Signed)
History and Physical Interval Note:  12/07/2013 8:31 AM  [name]  has presented today for surgery, with the diagnosis of Melena, Anemia  The various methods of treatment have been discussed with the patient and family. After consideration of risks, benefits and other options for treatment, the patient has consented to  Procedure(s): COLONOSCOPY (N/A) as a surgical intervention .  The patient's history has been reviewed, patient examined, no change in status, stable for surgery.  I have reviewed the patient's chart and labs.  Questions were answered to the patient's satisfaction.    The recent H&P (dated *12/06/13**) was reviewed, the patient was examined and there is no change in the patients condition since that H&P was completed.   Erskine Emery  12/07/2013, 8:31 AM    Erskine Emery

## 2013-12-07 NOTE — Progress Notes (Signed)
Colonoscopy demonstrated nonbleeding AVMs in the cecum and a single AVM in the sigmoid.  These were cauterized.  I suspect that he may have AVMs elsewhere in his GI tract.  Would consider double balloon enteroscopy as an outpatient.

## 2013-12-08 DIAGNOSIS — D649 Anemia, unspecified: Secondary | ICD-10-CM

## 2013-12-08 LAB — GLUCOSE, CAPILLARY
GLUCOSE-CAPILLARY: 162 mg/dL — AB (ref 70–99)
GLUCOSE-CAPILLARY: 222 mg/dL — AB (ref 70–99)
Glucose-Capillary: 202 mg/dL — ABNORMAL HIGH (ref 70–99)
Glucose-Capillary: 238 mg/dL — ABNORMAL HIGH (ref 70–99)
Glucose-Capillary: 162 mg/dL — ABNORMAL HIGH (ref 70–99)

## 2013-12-08 LAB — CBC
HCT: 31.2 % — ABNORMAL LOW (ref 39.0–52.0)
Hemoglobin: 9.5 g/dL — ABNORMAL LOW (ref 13.0–17.0)
MCH: 26.8 pg (ref 26.0–34.0)
MCHC: 30.4 g/dL (ref 30.0–36.0)
MCV: 87.9 fL (ref 78.0–100.0)
PLATELETS: 216 10*3/uL (ref 150–400)
RBC: 3.55 MIL/uL — ABNORMAL LOW (ref 4.22–5.81)
RDW: 17.2 % — AB (ref 11.5–15.5)
WBC: 7.4 10*3/uL (ref 4.0–10.5)

## 2013-12-08 MED ORDER — ALBUTEROL SULFATE (2.5 MG/3ML) 0.083% IN NEBU
2.5000 mg | INHALATION_SOLUTION | Freq: Two times a day (BID) | RESPIRATORY_TRACT | Status: DC
  Administered 2013-12-08 – 2013-12-10 (×5): 2.5 mg via RESPIRATORY_TRACT
  Filled 2013-12-08 (×5): qty 3

## 2013-12-08 MED ORDER — TIOTROPIUM BROMIDE MONOHYDRATE 18 MCG IN CAPS
18.0000 ug | ORAL_CAPSULE | Freq: Every day | RESPIRATORY_TRACT | Status: DC
  Administered 2013-12-08 – 2013-12-10 (×3): 18 ug via RESPIRATORY_TRACT
  Filled 2013-12-08: qty 5

## 2013-12-08 MED ORDER — ALBUTEROL SULFATE (2.5 MG/3ML) 0.083% IN NEBU
2.5000 mg | INHALATION_SOLUTION | Freq: Four times a day (QID) | RESPIRATORY_TRACT | Status: DC | PRN
  Administered 2013-12-09: 2.5 mg via RESPIRATORY_TRACT
  Filled 2013-12-08: qty 3

## 2013-12-08 NOTE — Progress Notes (Signed)
    Progress Note   Subjective  **No new complaints*   Objective  Vital signs in last 24 hours: Temp:  [97 F (36.1 C)-97.9 F (36.6 C)] 97 F (36.1 C) (02/22 0830) Pulse Rate:  [86-117] 114 (02/22 1000) Resp:  [8-32] 21 (02/22 1000) BP: (120-162)/(62-94) 140/62 mmHg (02/22 1000) SpO2:  [90 %-100 %] 94 % (02/22 1000) Weight:  [273 lb 13 oz (124.2 kg)] 273 lb 13 oz (124.2 kg) (02/22 0400) Last BM Date: 12/07/13 General:   Alert,  Well-developed,  white male in NAD Heart:  Regular rate and rhythm; no murmurs Abdomen:  Soft, nontender and nondistended. Normal bowel sounds, without guarding, and without rebound.   Extremities:  Without edema. Neurologic:  Alert and  oriented x4;  grossly normal neurologically. Psych:  Alert and cooperative. Normal mood and affect.  Intake/Output from previous day: 02/21 0701 - 02/22 0700 In: 295 [P.O.:120; I.V.:175] Out: 1150 [Urine:1150] Intake/Output this shift: Total I/O In: -  Out: 350 [Urine:350]  Lab Results:  Recent Labs  12/06/13 0318 12/06/13 2248 12/08/13 0915  WBC 8.6  --  7.4  HGB 7.3* 9.2* 9.5*  HCT 24.2* 29.8* 31.2*  PLT 166  --  216   BMET No results found for this basename: NA, K, CL, CO2, GLUCOSE, BUN, CREATININE, CALCIUM,  in the last 72 hours LFT No results found for this basename: PROT, ALBUMIN, AST, ALT, ALKPHOS, BILITOT, BILIDIR, IBILI,  in the last 72 hours PT/INR No results found for this basename: LABPROT, INR,  in the last 72 hours Hepatitis Panel No results found for this basename: HEPBSAG, HCVAB, HEPAIGM, HEPBIGM,  in the last 72 hours  Studies/Results: Nm Gi Blood Loss  12/06/2013   CLINICAL DATA:  Gastrointestinal bleeding.  EXAM: NUCLEAR MEDICINE GASTROINTESTINAL BLEEDING SCAN  TECHNIQUE: Sequential abdominal images were obtained following intravenous administration of Tc-24m labeled red blood cells.  COMPARISON:  CT abdomen and pelvis 12/04/2013.  RADIOPHARMACEUTICALS:  95.1 MILLICURIE ULTRATAG  TECHNETIUM TC 75M-LABELED RED BLOOD CELLS IV KITmCi Tc-79m in-vitro labeled red cells.  FINDINGS: No evidence of gastrointestinal bleeding is identified. Soft tissue and vascular structures demonstrate normal activity.  IMPRESSION: Negative for GI bleed.   Electronically Signed   By: Inge Rise M.D.   On: 12/06/2013 15:26      Assessment & Plan  *Recurrent GI bleeding.  Small colonic AVMs cauterized.  I suspect he may have small bowel AVMs as well.  T/c double balloon enteroscopy.  Will discuss with Dr. Benson Norway.  OK to advance diet. ** Principal Problem:   GI bleeding Active Problems:   DM   Anemia   CAD (coronary artery disease)   AAA (abdominal aortic aneurysm)   Chronic hypoxemic respiratory failure   GI bleed   Abdominal pain   Leucocytosis   AVM (arteriovenous malformation) of colon     LOS: 4 days   Erskine Emery  12/08/2013, 12:24 PM

## 2013-12-08 NOTE — Progress Notes (Signed)
[facility] PROGRESS NOTE  [name] PYK:[id number] DOB: 1941/05/21 DOA: 12/04/2013 PCP: Arnette Norris, MD  Assessment/Plan: 1. Upper GI bleed 1. GI following -pt with suspected colonic AVM's that were cauterized 2. Neg bleeding scan 3. Hgb has thus far remained stable 2. Hx CAD 1. Stable 3. Hx AAA 1. Appears stable 2. Monitor 4. Hx COPD 1. On minimal O2 support 2. Cont current regimen 5. DVT prophylaxis 1. SCD's  Code Status: Full Family Communication: Pt in room Disposition Plan: Pending  Consultants:  GI  Procedures:  Capsule endoscopy 12/05/13  Bleeding scan 12/06/13  Colonoscopy 12/07/13  Antibiotics:    HPI/Subjective: No acute events overnight  Objective: Filed Vitals:   12/08/13 0400 12/08/13 0500 12/08/13 0600 12/08/13 0723  BP: 148/89 130/79 144/74   Pulse: 87 86 86   Temp: 97.9 F (36.6 C)     TempSrc: Oral     Resp: 18 17 13    Height:      Weight: 124.2 kg (273 lb 13 oz)     SpO2: 95% 95% 98% 96%    Intake/Output Summary (Last 24 hours) at 12/08/13 0803 Last data filed at 12/08/13 0600  Gross per 24 hour  Intake    295 ml  Output   1150 ml  Net   -855 ml   Filed Weights   12/06/13 0400 12/07/13 0201 12/08/13 0400  Weight: 128.3 kg (282 lb 13.6 oz) 123.8 kg (272 lb 14.9 oz) 124.2 kg (273 lb 13 oz)    Exam:   General:  Awake, in nad  Cardiovascular: regular, s1, s2  Respiratory: normal resp effort, no wheezing  Abdomen: soft, nondistended  Musculoskeletal: perfused, no clubbing   Data Reviewed: Basic Metabolic Panel:  Recent Labs Lab 12/04/13 0125  NA 137  K 5.1  CL 102  CO2 24  GLUCOSE 220*  BUN 35*  CREATININE 1.00  CALCIUM 8.6   Liver Function Tests: No results found for this basename: AST, ALT, ALKPHOS, BILITOT, PROT, ALBUMIN,  in the last 168 hours No results found for this basename: LIPASE, AMYLASE,  in the last 168 hours No results found for this basename: AMMONIA,  in the last 168  hours CBC:  Recent Labs Lab 12/04/13 0125 12/04/13 1920 12/06/13 0318 12/06/13 2248  WBC 18.1* 10.7* 8.6  --   NEUTROABS 15.1*  --   --   --   HGB 7.1* 8.2* 7.3* 9.2*  HCT 25.3* 26.7* 24.2* 29.8*  MCV 85.2 83.7 85.8  --   PLT 294 228 166  --    Cardiac Enzymes:  Recent Labs Lab 12/04/13 0545  TROPONINI <0.30   BNP (last 3 results)  Recent Labs  08/19/13 1531 09/07/13 2020 10/15/13 1534  PROBNP 596.9* 293.6* 487.1*   CBG:  Recent Labs Lab 12/07/13 0804 12/07/13 1007 12/07/13 1303 12/07/13 1630 12/07/13 2100  GLUCAP 144* 146* 159* 169* 202*    Recent Results (from the past 240 hour(s))  MRSA PCR SCREENING     Status: None   Collection Time    12/04/13  6:15 AM      Result Value Ref Range Status   MRSA by PCR NEGATIVE  NEGATIVE Final   Comment:            The GeneXpert MRSA Assay (FDA     approved for NASAL specimens     only), is one component of a     comprehensive MRSA colonization     surveillance program. It is not  intended to diagnose MRSA     infection nor to guide or     monitor treatment for     MRSA infections.     Studies: Nm Gi Blood Loss  12/06/2013   CLINICAL DATA:  Gastrointestinal bleeding.  EXAM: NUCLEAR MEDICINE GASTROINTESTINAL BLEEDING SCAN  TECHNIQUE: Sequential abdominal images were obtained following intravenous administration of Tc-15m labeled red blood cells.  COMPARISON:  CT abdomen and pelvis 12/04/2013.  RADIOPHARMACEUTICALS:  94.5 MILLICURIE ULTRATAG TECHNETIUM TC 94M-LABELED RED BLOOD CELLS IV KITmCi Tc-18m in-vitro labeled red cells.  FINDINGS: No evidence of gastrointestinal bleeding is identified. Soft tissue and vascular structures demonstrate normal activity.  IMPRESSION: Negative for GI bleed.   Electronically Signed   By: Inge Rise M.D.   On: 12/06/2013 15:26    Scheduled Meds: . albuterol  2.5 mg Nebulization BID  . budesonide-formoterol  2 puff Inhalation BID  . HYDROcodone-acetaminophen  1 tablet  Oral Once  .  HYDROmorphone (DILAUDID) injection  1 mg Intravenous Once  . insulin aspart  0-9 Units Subcutaneous TID WC  . pantoprazole (PROTONIX) IV  40 mg Intravenous Q12H  . sodium chloride  3 mL Intravenous Q12H  . tiotropium  18 mcg Inhalation Daily   Continuous Infusions: . sodium chloride    . sodium chloride      Principal Problem:   GI bleeding Active Problems:   DM   Anemia   CAD (coronary artery disease)   AAA (abdominal aortic aneurysm)   Chronic hypoxemic respiratory failure   GI bleed   Abdominal pain   Leucocytosis   AVM (arteriovenous malformation) of colon  Time spent: 30min  [facility] Pager [phone]. If 7PM-7AM, please contact night-coverage at [email or web address], password [facility] 12/08/2013, 8:03 AM  LOS: 4 days

## 2013-12-09 ENCOUNTER — Encounter (HOSPITAL_COMMUNITY): Payer: Self-pay | Admitting: Gastroenterology

## 2013-12-09 DIAGNOSIS — D500 Iron deficiency anemia secondary to blood loss (chronic): Secondary | ICD-10-CM

## 2013-12-09 DIAGNOSIS — K921 Melena: Secondary | ICD-10-CM

## 2013-12-09 LAB — CBC
HCT: 30.3 % — ABNORMAL LOW (ref 39.0–52.0)
HEMOGLOBIN: 9.1 g/dL — AB (ref 13.0–17.0)
MCH: 26.2 pg (ref 26.0–34.0)
MCHC: 30 g/dL (ref 30.0–36.0)
MCV: 87.3 fL (ref 78.0–100.0)
Platelets: 206 10*3/uL (ref 150–400)
RBC: 3.47 MIL/uL — AB (ref 4.22–5.81)
RDW: 16.9 % — ABNORMAL HIGH (ref 11.5–15.5)
WBC: 6.9 10*3/uL (ref 4.0–10.5)

## 2013-12-09 LAB — GLUCOSE, CAPILLARY
GLUCOSE-CAPILLARY: 237 mg/dL — AB (ref 70–99)
Glucose-Capillary: 195 mg/dL — ABNORMAL HIGH (ref 70–99)
Glucose-Capillary: 198 mg/dL — ABNORMAL HIGH (ref 70–99)
Glucose-Capillary: 307 mg/dL — ABNORMAL HIGH (ref 70–99)

## 2013-12-09 NOTE — Evaluation (Signed)
Occupational Therapy Evaluation Patient Details Name: [name] MRN: [id number] DOB: May 07, 1941 Today's Date: 12/09/2013 Time: 7106-2694 OT Time Calculation (min): 35 min  OT Assessment / Plan / Recommendation History of present illness 73 yo male admitted with acute blood loss anemia, weakness, GI hemorrhage. Hx of chronic back pain, HTN, COPD-O2 dependent, CAD, angina   Clinical Impression   Pt does fatigue with activity and has been limited PTA with ADL due to fatigue. He will benefit from OT to address energy conservation education with pt and progress ADL independence for d/c home.    OT Assessment  Patient needs continued OT Services    Follow Up Recommendations  Home health OT versus none depending on progress;Supervision/Assistance - 24 hour    Barriers to Discharge      Equipment Recommendations  None recommended by OT    Recommendations for Other Services    Frequency  Min 2X/week    Precautions / Restrictions Precautions Precautions: Fall Precaution Comments: monitor O2 sats Restrictions Weight Bearing Restrictions: No   Pertinent Vitals/Pain  90% on 3L with activity; up to 93% with standing rest break 100% on 3L once pt returned to room and sat down. Pt uses 3L at home Returned to 2L at end of session. Pt reports some chronic pain bilateral hip; unrated; rest    ADL  Eating/Feeding: Simulated;Independent Where Assessed - Eating/Feeding: Edge of bed Grooming: Wash/dry hands;Set up;Performed Where Assessed - Grooming: Unsupported sitting Upper Body Bathing: Simulated;Chest;Right arm;Left arm;Abdomen;Set up Where Assessed - Upper Body Bathing: Unsupported sitting Lower Body Bathing: Simulated;Min guard Where Assessed - Lower Body Bathing: Supported sit to stand Upper Body Dressing: Simulated;Set up Where Assessed - Upper Body Dressing: Unsupported sitting Lower Body Dressing: Simulated;Min guard Where Assessed - Lower Body Dressing: Supported sit to  stand Toilet Transfer: Simulated;Min guard;Minimal assistance (sit to stand from bed and functional mobility in room. See PT notes for ambulation in hallway. min assist to initially steady but progressed to min assist. ) Toilet Transfer Method: Sit to stand Toileting - Clothing Manipulation and Hygiene: Simulated;Min guard Where Assessed - Best boy and Hygiene: Standing Equipment Used: Other (comment) (oxygen) ADL Comments: Pt declines use of a walker despite reporting feeling weak and unsteady. Pt overly fixated on his bed controls and remote not working and his meal not being exactly as he desired. Reported concerns to nursing secretary and pt on the phone at the end of session to request  more on his fruit tray. Pt encouraged to perforom PLB when SOB. Pt initially alittle unsteady with functional transfers but improved with being up more. He states he hasnt had the energy to shower or do much with his ADL in the last few months.     OT Diagnosis: Generalized weakness  OT Problem List: Decreased strength;Decreased activity tolerance;Decreased knowledge of use of DME or AE OT Treatment Interventions: Self-care/ADL training;DME and/or AE instruction;Therapeutic activities;Patient/family education   OT Goals(Current goals can be found in the care plan section) Acute Rehab OT Goals Patient Stated Goal: wants to get up and walk OT Goal Formulation: With patient Time For Goal Achievement: 12/23/13 Potential to Achieve Goals: Good  Visit Information  Last OT Received On: 12/09/13 Assistance Needed: +1 PT/OT/SLP Co-Evaluation/Treatment: Yes Reason for Co-Treatment: Other (comment) PT goals addressed during session: Mobility/safety with mobility;Balance OT goals addressed during session: ADL's and self-care;Proper use of Adaptive equipment and DME History of Present Illness: 73 yo male admitted with acute blood loss anemia, weakness, GI hemorrhage. Hx  of chronic back pain,  HTN, COPD-O2 dependent, CAD, angina       Prior Functioning     Home Living Family/patient expects to be discharged to:: Private residence Living Arrangements: Spouse/significant other Available Help at Discharge: Family Type of Home: House Home Access: Stairs to enter Technical brewer of Steps: 4 Entrance Stairs-Rails: Right;Left Home Layout: One level Home Equipment: Environmental consultant - 2 wheels;Bedside commode;Cane - single point;Hospital bed Prior Function Comments: pt states wife is too busy to help him as she manages horses. He states he doesnt sponge bathe much and wears mostly boxers and tshirt around the house. He has had difficulty getting up from toilet but states 3in1 wont fit in bathroom well.  Communication Communication: No difficulties         Vision/Perception     Cognition  Cognition Arousal/Alertness: Awake/alert Behavior During Therapy:  (pt overly focused on bed and remote not working, lunch try not as desired) Overall Cognitive Status: Within Functional Limits for tasks assessed    Extremity/Trunk Assessment Upper Extremity Assessment Upper Extremity Assessment: Overall WFL for tasks assessed     Mobility Bed Mobility Overal bed mobility: Needs Assistance Bed Mobility: Supine to Sit Supine to sit: Min guard General bed mobility comments: min guard for safety. uses momentum to thrust himself into sitting with several attempts to be successful. Transfers Overall transfer level: Needs assistance Transfers: Sit to/from Stand Sit to Stand: Min guard     Exercise     Balance     End of Session OT - End of Session Activity Tolerance: Patient limited by fatigue Patient left: with call bell/phone within reach (at [facility] for meal)  GO     Jules Schick [phone] 12/09/2013, 12:41 PM

## 2013-12-09 NOTE — Evaluation (Addendum)
Physical Therapy Evaluation Patient Details Name: [name] MRN: [id number] DOB: [birth year] Today's Date: 12/09/2013 Time: 2330-0762 PT Time Calculation (min): 28 min  PT Assessment / Plan / Recommendation History of Present Illness  73 yo male admitted with acute blood loss anemia, weakness, GI hemorrhage. Hx of chronic back pain, HTN, COPD-O2 dependent, CAD, angina  Clinical Impression  *Pt admitted with GI bleed*. Pt currently with functional limitations due to the deficits listed below (see PT Problem List).  Pt will benefit from skilled PT to increase their independence and safety with mobility to allow discharge to the venue listed below.   **    PT Assessment  Patient needs continued PT services    Follow Up Recommendations  Home health PT    Does the patient have the potential to tolerate intense rehabilitation      Barriers to Discharge        Equipment Recommendations  None recommended by PT    Recommendations for Other Services     Frequency Min 3X/week    Precautions / Restrictions Precautions Precautions: Fall Precaution Comments: monitor O2 sats Restrictions Weight Bearing Restrictions: No   Pertinent Vitals/Pain *SaO2 90% on 3L O2 with walking 0/10 pain**      Mobility  Bed Mobility Overal bed mobility: Needs Assistance Bed Mobility: Supine to Sit Supine to sit: Min guard General bed mobility comments: min guard for safety. uses momentum to thrust himself into sitting with several attempts to be successful, pt fatigued after supine to sit. Instructed pt in log roll technique to use in furture.  Transfers Overall transfer level: Needs assistance Equipment used: None Transfers: Sit to/from Stand Sit to Stand: Min guard General transfer comment: min/guard for balance Ambulation/Gait Ambulation/Gait assistance: Min guard Ambulation Distance (Feet): 300 Feet Assistive device: None Gait Pattern/deviations: WFL(Within Functional Limits) Gait  velocity interpretation: Below normal speed for age/gender General Gait Details: pt walked with 3L O2 [place], SaO2 90% with walking, 93% during standing rest, 100% seated. Initially mild unsteadiness, but pt able to self correct. He refused to use a RW.     Exercises     PT Diagnosis: Generalized weakness;Difficulty walking  PT Problem List: Decreased activity tolerance;Decreased balance;Decreased mobility;Obesity;Cardiopulmonary status limiting activity PT Treatment Interventions: Gait training;Functional mobility training     PT Goals(Current goals can be found in the care plan section) Acute Rehab PT Goals Patient Stated Goal: wants to get up and walk PT Goal Formulation: With patient Time For Goal Achievement: 12/23/13 Potential to Achieve Goals: Good  Visit Information  Last PT Received On: 12/09/13 Assistance Needed: +1 PT/OT/SLP Co-Evaluation/Treatment: Yes Reason for Co-Treatment: Other (comment) (pt fatigue) PT goals addressed during session: Mobility/safety with mobility;Balance OT goals addressed during session: ADL's and self-care;Proper use of Adaptive equipment and DME History of Present Illness: 73 yo male admitted with acute blood loss anemia, weakness, GI hemorrhage. Hx of chronic back pain, HTN, COPD-O2 dependent, CAD, angina       Prior Functioning  Home Living Family/patient expects to be discharged to:: Private residence Living Arrangements: Spouse/significant other Available Help at Discharge: Family Type of Home: House Home Access: Stairs to enter Technical brewer of Steps: 4 Entrance Stairs-Rails: [place]: One level Home Equipment: Environmental consultant - 2 wheels;Bedside commode;Cane - single point;Hospital bed; Oxygen Prior Function Level of Independence: Independent Comments: walked independently Communication Communication: No difficulties    Cognition  Cognition Arousal/Alertness: Awake/alert Behavior During Therapy: WFL for tasks  assessed/performed (pt overly focused on bed and remote  not working, lunch tray not as desired; assist provided to address these concerns) Overall Cognitive Status: Within Functional Limits for tasks assessed    Extremity/Trunk Assessment Upper Extremity Assessment Upper Extremity Assessment: Overall WFL for tasks assessed Lower Extremity Assessment Lower Extremity Assessment: Overall WFL for tasks assessed Cervical / Trunk Assessment Cervical / Trunk Assessment: Normal   Balance Balance Overall balance assessment: Needs assistance Sitting balance-Leahy Scale: Good Standing balance-Leahy Scale: Fair  End of Session PT - End of Session Equipment Utilized During Treatment: Gait belt;Oxygen Activity Tolerance: Patient tolerated treatment well Patient left: in bed;with call bell/phone within reach Nurse Communication: Mobility status  GP     Blondell Reveal Kistler 12/09/2013, 12:52 PM [phone]

## 2013-12-09 NOTE — Progress Notes (Signed)
Pt admitted to room 1504 from ICU.  Came in w/c without difficulty   Orientated to new room.  Medicated for sleep but at this time he states he has not slept at all.  He did sleep on and off for about 4 hours.  Self turning.but likes to be help with most things.  Able to express needs easily.

## 2013-12-09 NOTE — Progress Notes (Signed)
Subjective: He feels well.    Objective: Vital signs in last 24 hours: Temp:  [97.5 F (36.4 C)-97.9 F (36.6 C)] 97.6 F (36.4 C) (02/23 0537) Pulse Rate:  [94-104] 104 (02/23 0537) Resp:  [18-21] 18 (02/23 0537) BP: (125-153)/(59-89) 148/89 mmHg (02/23 0537) SpO2:  [90 %-98 %] 90 % (02/23 1200) Weight:  [265 lb 6.9 oz (120.4 kg)] 265 lb 6.9 oz (120.4 kg) (02/22 1904) Last BM Date: 12/07/13  Intake/Output from previous day: 02/22 0701 - 02/23 0700 In: -  Out: 1150 [Urine:1150] Intake/Output this shift: Total I/O In: -  Out: 200 [Urine:200]  General appearance: alert and no distress GI: soft, non-tender; bowel sounds normal; no masses,  no organomegaly  Lab Results:  Recent Labs  12/06/13 2248 12/08/13 0915 12/09/13 0516  WBC  --  7.4 6.9  HGB 9.2* 9.5* 9.1*  HCT 29.8* 31.2* 30.3*  PLT  --  216 206   BMET No results found for this basename: NA, K, CL, CO2, GLUCOSE, BUN, CREATININE, CALCIUM,  in the last 72 hours LFT No results found for this basename: PROT, ALBUMIN, AST, ALT, ALKPHOS, BILITOT, BILIDIR, IBILI,  in the last 72 hours PT/INR No results found for this basename: LABPROT, INR,  in the last 72 hours Hepatitis Panel No results found for this basename: HEPBSAG, HCVAB, HEPAIGM, HEPBIGM,  in the last 72 hours C-Diff No results found for this basename: CDIFFTOX,  in the last 72 hours Fecal Lactopherrin No results found for this basename: FECLLACTOFRN,  in the last 72 hours  Studies/Results: No results found.  Medications:  Scheduled: . albuterol  2.5 mg Nebulization BID  . budesonide-formoterol  2 puff Inhalation BID  . HYDROcodone-acetaminophen  1 tablet Oral Once  .  HYDROmorphone (DILAUDID) injection  1 mg Intravenous Once  . insulin aspart  0-9 Units Subcutaneous TID WC  . pantoprazole (PROTONIX) IV  40 mg Intravenous Q12H  . sodium chloride  3 mL Intravenous Q12H  . tiotropium  18 mcg Inhalation Daily   Continuous:   Assessment/Plan: 1)  Bleeding cecal AVMs.   The site was identified and ablated.  HGB stable.  Hopefully this will help to prevent any further bleeding or markedly slow down his bleeding.  Clinically he appears well.  He was grateful for the physical therapy.  Plan: 1) No further GI intervention.  I think he can be discharged and he feels more comfortable to tomorrow AM 2) Follow up in 2 weeks.  LOS: 5 days   Staphany Ditton D 12/09/2013, 1:21 PM

## 2013-12-09 NOTE — Progress Notes (Signed)
Notified K. Kirby,NP by pager that pt had a HS CBG of 307. No HS coverage currently ordered.

## 2013-12-09 NOTE — Progress Notes (Signed)
[facility] PROGRESS NOTE  [name] XVQ:[id number] DOB: 1941-02-23 DOA: 12/04/2013 PCP: Arnette Norris, MD  Assessment/Plan: 1. Upper GI bleed 1. 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 Neg bleeding scan 3. Hgb remains stable 2. Hx CAD 1. Stable 3. Hx AAA 1. Appears stable 2. Monitor 4. Hx COPD 1. On minimal O2 support 2. Cont current regimen 5. DVT prophylaxis 1. SCD's  Code Status: Full Family Communication: Pt in room Disposition Plan: Pending  Consultants:  GI  Procedures:  Capsule endoscopy 12/05/13  Bleeding scan 12/06/13  Colonoscopy 12/07/13  Antibiotics:    HPI/Subjective: No events reported overnight.  Objective: Filed Vitals:   12/08/13 1904 12/08/13 2009 12/09/13 0537 12/09/13 0809  BP: 153/80  148/89   Pulse: 104  104   Temp: 97.5 F (36.4 C)  97.6 F (36.4 C)   TempSrc: Oral  Oral   Resp: 20  18   Height: 6\' 3"  (1.905 m)     Weight: 120.4 kg (265 lb 6.9 oz)     SpO2: 96% 97% 97% 98%    Intake/Output Summary (Last 24 hours) at 12/09/13 0821 Last data filed at 12/09/13 0251  Gross per 24 hour  Intake      0 ml  Output   1150 ml  Net  -1150 ml   Filed Weights   12/07/13 0201 12/08/13 0400 12/08/13 1904  Weight: 123.8 kg (272 lb 14.9 oz) 124.2 kg (273 lb 13 oz) 120.4 kg (265 lb 6.9 oz)    Exam:   General:  Awake, in nad  Cardiovascular: regular, s1, s2  Respiratory: normal resp effort, no wheezing  Abdomen: soft, nondistended  Musculoskeletal: perfused, no clubbing   Data Reviewed: Basic Metabolic Panel:  Recent Labs Lab 12/04/13 0125  NA 137  K 5.1  CL 102  CO2 24  GLUCOSE 220*  BUN 35*  CREATININE 1.00  CALCIUM 8.6   Liver Function Tests: No results found for this basename: AST, ALT, ALKPHOS, BILITOT, PROT, ALBUMIN,  in the last 168 hours No results found for this basename: LIPASE, AMYLASE,  in the last 168 hours No results found for this basename:  AMMONIA,  in the last 168 hours CBC:  Recent Labs Lab 12/04/13 0125 12/04/13 1920 12/06/13 0318 12/06/13 2248 12/08/13 0915 12/09/13 0516  WBC 18.1* 10.7* 8.6  --  7.4 6.9  NEUTROABS 15.1*  --   --   --   --   --   HGB 7.1* 8.2* 7.3* 9.2* 9.5* 9.1*  HCT 25.3* 26.7* 24.2* 29.8* 31.2* 30.3*  MCV 85.2 83.7 85.8  --  87.9 87.3  PLT 294 228 166  --  216 206   Cardiac Enzymes:  Recent Labs Lab 12/04/13 0545  TROPONINI <0.30   BNP (last 3 results)  Recent Labs  08/19/13 1531 09/07/13 2020 10/15/13 1534  PROBNP 596.9* 293.6* 487.1*   CBG:  Recent Labs Lab 12/08/13 0741 12/08/13 1140 12/08/13 1623 12/08/13 2138 12/09/13 0720  GLUCAP 162* 238* 162* 222* 195*    Recent Results (from the past 240 hour(s))  MRSA PCR SCREENING     Status: None   Collection Time    12/04/13  6:15 AM      Result Value Ref Range Status   MRSA by PCR NEGATIVE  NEGATIVE Final   Comment:            The GeneXpert MRSA Assay (FDA     approved for NASAL specimens  only), is one component of a     comprehensive MRSA colonization     surveillance program. It is not     intended to diagnose MRSA     infection nor to guide or     monitor treatment for     MRSA infections.     Studies: No results found.  Scheduled Meds: . albuterol  2.5 mg Nebulization BID  . budesonide-formoterol  2 puff Inhalation BID  . HYDROcodone-acetaminophen  1 tablet Oral Once  .  HYDROmorphone (DILAUDID) injection  1 mg Intravenous Once  . insulin aspart  0-9 Units Subcutaneous TID WC  . pantoprazole (PROTONIX) IV  40 mg Intravenous Q12H  . sodium chloride  3 mL Intravenous Q12H  . tiotropium  18 mcg Inhalation Daily   Continuous Infusions:    Principal Problem:   GI bleeding Active Problems:   DM   Anemia   CAD (coronary artery disease)   AAA (abdominal aortic aneurysm)   Chronic hypoxemic respiratory failure   GI bleed   Abdominal pain   Leucocytosis   AVM (arteriovenous malformation) of  colon  Time spent: 70min  Pinkey Mcjunkin, [facility] Pager [phone]. If 7PM-7AM, please contact night-coverage at [email or web address], password [facility] 12/09/2013, 8:21 AM  LOS: 5 days

## 2013-12-10 LAB — GLUCOSE, CAPILLARY
Glucose-Capillary: 220 mg/dL — ABNORMAL HIGH (ref 70–99)
Glucose-Capillary: 296 mg/dL — ABNORMAL HIGH (ref 70–99)

## 2013-12-10 MED ORDER — FERROUS SULFATE 325 (65 FE) MG PO TABS
325.0000 mg | ORAL_TABLET | Freq: Every day | ORAL | Status: DC
Start: 2013-12-10 — End: 2013-12-10
  Administered 2013-12-10: 325 mg via ORAL
  Filled 2013-12-10 (×2): qty 1

## 2013-12-10 MED ORDER — FERROUS SULFATE 325 (65 FE) MG PO TABS: 325.0000 mg | ORAL_TABLET | Freq: Every day | ORAL | Status: DC

## 2013-12-10 NOTE — Progress Notes (Signed)
Inpatient Diabetes Program Recommendations  AACE/ADA: New Consensus Statement on Inpatient Glycemic Control (2013)  Target Ranges:  Prepandial:   less than 140 mg/dL      Peak postprandial:   less than 180 mg/dL (1-2 hours)      Critically ill patients:  140 - 180 mg/dL   Results for JAXSUN, CIAMPI (MRN [id number]) as of 12/10/2013 11:39  Ref. Range 12/09/2013 07:20 12/09/2013 11:31 12/09/2013 16:29 12/09/2013 21:21 12/10/2013 07:39  Glucose-Capillary Latest Range: 70-99 mg/dL 195 (H) 237 (H) 198 (H) 307 (H) 220 (H)   Diabetes history: DM2 Outpatient Diabetes medications: Metformin 500 mg BID (noted on home medication list, there is a note stating patient is out of Metformin and has no refills left) Current orders for Inpatient glycemic control: Novolog 0-9 units [facility]  Inpatient Diabetes Program Recommendations Correction (SSI):Please increase Novolog correction to moderate scale. Insulin - Meal Coverage: If not discharged today, please order Novolog 4 units TID with meals for meal coverage since postprandial glucose consistently elevated. HgbA1C: Please order an A1C to evaluate glycemic control over the past 2-3 months. Last A1C was 6.5% on 09/08/2013. Diet: Please discontinue regular diet and order Carb Modified Diabetic diet.  Thanks, Barnie Alderman, RN, MSN, CCRN Diabetes Coordinator Inpatient Diabetes Program [phone] (Team Pager) [phone] (AP office) [phone] [facility] office)

## 2013-12-10 NOTE — Progress Notes (Signed)
OT Cancellation Note  Patient Details Name: [name] MRN: [id number] DOB: Apr 27, 1941   Cancelled Treatment:    Reason Eval/Treat Not Completed: Other (comment) Pt declines needing OT. "I just want to walk." Offered to practice any of his ADL that may need to but pt states he doesn't need to. Pt agreeable to let OT check back in a day or so if he is still here to see if any new needs.  [place], [place] 12/10/2013, 11:12 AM

## 2013-12-10 NOTE — Progress Notes (Signed)
Pt has chosen [facility] to provide HHPT/OT services. Referral has been made.  Allene Dillon RN BSN   [phone]

## 2013-12-10 NOTE — Progress Notes (Signed)
Called to pt's room stating that he needed help getting cleaned up due urinating on himself. RN left pt's room to gather supplies to help get pt's cleaned up. Upon returning to the  room no visible places on the  gown or floor were seen where the pt urinated . 150cc of urine emptied from urinal.  Pt states that urine was on his legs and socks. Pt handed washcloth to wash his legs and feet, then pt asks RN what do you want me to wipe off. Explained to the pt that the washcloth was to wash of his legs and feet were states he urinated. Pt threw washcloths on the floor and pt states " never mind, forget it, I'm lying back down, put a sheet over me." New sheet given to pt and pt notified to call if further assistance needed.

## 2013-12-10 NOTE — Discharge Summary (Addendum)
Physician Discharge Summary  [name] [id number] DOB: 07/07/1941 DOA: 12/04/2013  PCP: Arnette Norris, MD  Admit date: 12/04/2013 Discharge date: 12/10/2013  Time spent: 35 minutes  Recommendations for Outpatient Follow-up:  Follow up with PCP in 1-2 weeks Follow up with GI as schedueld  Discharge Diagnoses:  Principal Problem:   GI bleeding Active Problems:   DM   Anemia   CAD (coronary artery disease)   AAA (abdominal aortic aneurysm)   Chronic hypoxemic respiratory failure   GI bleed   Abdominal pain   Leucocytosis   AVM (arteriovenous malformation) of colon   Discharge Condition: Improved  Diet recommendation: Diabetic  Filed Weights   12/08/13 0400 12/08/13 1904 12/10/13 0455  Weight: 124.2 kg (273 lb 13 oz) 120.4 kg (265 lb 6.9 oz) 123.4 kg (272 lb 0.8 oz)    History of present illness:  [name] is a 73 y.o. male with history of recurrent GI bleeding has had endoscopy done in November and December last which showed friable gastric polyps and vascular ectasia and was originally planned to get capsule endoscopy presents to the year because of sudden onset of rectal bleeding. Patient states that last night he had at least 2-3 bowel movements which were black in color before coming to the ER. At that time he felt almost he passed out with some chest pressure. In the ER he had another 2 movements. He was found to be tachycardic with hemoglobin around 7. Patient also was found to be mildly short of breath for which ER physician had given Lasix. Patient complains of diffuse abdominal pain and has history of abdominal aortic aneurysm which patient states that is being followed by vascular surgeon and has increased in size during his recent sonogram studies. Patient did have some chest pressure initially but presently is chest pain-free. Denies any nausea vomiting. Denies any focal deficits headache or visual symptoms.   Hospital Course:  1. Upper GI bleed  1. 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 Neg bleeding scan 3. Hgb remained stable after requiring a total of 4 units of prbc's 2. Hx CAD  1. Remained stable 3. Hx AAA  1. Appeared stable 2. Would cont to Monitor 4. Hx COPD  1. Remained on minimal O2 support 5. DVT prophylaxis  1. Cont on SCD's  Procedures: Capsule endoscopy 12/05/13  Bleeding scan 12/06/13  Colonoscopy 12/07/13  Consultations:  GI  Discharge Exam: Filed Vitals:   12/10/13 0447 12/10/13 0455 12/10/13 0950 12/10/13 1406  BP: 126/65   132/72  Pulse: 85   107  Temp: 97.5 F (36.4 C)   97.5 F (36.4 C)  TempSrc: Oral   Oral  Resp: 16   20  Height:      Weight:  123.4 kg (272 lb 0.8 oz)    SpO2: 97%  94% 94%    General: Awake, in nad Cardiovascular: regular, s1, s2 Respiratory: normal resp effort, no wheezing  Discharge Instructions       Future Appointments Provider Department Dept Phone   05/05/2014 8:30 AM [facility] [place] [phone]   Eat a light meal the night before the exam Nothing to eat or drink for at least 8 hours before the exam No gum chewing, or smoking the morning of the exam Please take your morning medications with small sips of water, especially blood pressure medication *Very Important* Please wear 2 piece clothing.   05/05/2014 9:00  AM Sharmon Leyden Nickel, NP [facility] [phone]       Medication List         albuterol 108 (90 BASE) MCG/ACT inhaler  Commonly known as:  PROVENTIL HFA;VENTOLIN HFA  Inhale 2 puffs into the lungs every 6 (six) hours as needed for wheezing.     ALPRAZolam 0.25 MG tablet  Commonly known as:  XANAX  Take 1 tablet (0.25 mg total) by mouth 3 (three) times daily as needed for anxiety.     atorvastatin 40 MG tablet  Commonly known as:  LIPITOR  Take 40 mg by mouth at bedtime.     budesonide-formoterol 160-4.5 MCG/ACT inhaler  Commonly known  as:  SYMBICORT  Inhale 2 puffs into the lungs 2 (two) times daily.     citalopram 20 MG tablet  Commonly known as:  CELEXA  Take 1 tablet (20 mg total) by mouth daily.     clobetasol ointment 0.05 %  Commonly known as:  TEMOVATE  Apply 1 application topically 2 (two) times daily as needed (rash).     folic acid 1 MG tablet  Commonly known as:  FOLVITE  Take 2 mg by mouth every morning.     HYDROcodone-acetaminophen 5-325 MG per tablet  Commonly known as:  NORCO/VICODIN  Take 1 tablet by mouth every 6 (six) hours as needed for moderate pain.     ibuprofen 800 MG tablet  Commonly known as:  ADVIL,MOTRIN  Take 800 mg by mouth once.     iron polysaccharides 150 MG capsule  Commonly known as:  NIFEREX  Take 150 mg by mouth 2 (two) times daily.     metFORMIN 500 MG tablet  Commonly known as:  GLUCOPHAGE  Take 500 mg by mouth 2 (two) times daily with a meal.     metoprolol tartrate 25 MG tablet  Commonly known as:  LOPRESSOR  Take 1 tablet (25 mg total) by mouth 2 (two) times daily.     pantoprazole 40 MG tablet  Commonly known as:  PROTONIX  Take 40 mg by mouth 2 (two) times daily.     tiotropium 18 MCG inhalation capsule  Commonly known as:  SPIRIVA  Place 18 mcg into inhaler and inhale daily.     zolpidem 10 MG tablet  Commonly known as:  AMBIEN  Take 10 mg by mouth at bedtime as needed for sleep.       Allergies  Allergen Reactions  . Penicillins Anaphylaxis and Hives  . Demerol [Meperidine] Other (See Comments)    hallucinations  . Dilaudid [Hydromorphone Hcl] Other (See Comments)    hallucinations  . Levaquin [Levofloxacin In D5w]     Nausea and diarrhea  . Morphine And Related Nausea Only and Other (See Comments)    hallucinations   Follow-up Information   Follow up with Arnette Norris, MD. Schedule an appointment as soon as possible for a visit in 1 week.   Specialty:  Family Medicine   Contact information:   [place]  [address] [phone]       Follow up with [name], MD. (as scheduled)    Specialty:  Gastroenterology   Contact information:   [address] [place] [address] [phone]       Follow up with [facility]. (For home health PT and OT services)    Contact information:   [address] [place] [address] [phone]  The results of significant diagnostics from this hospitalization (including imaging, microbiology, ancillary and laboratory) are listed below for reference.    Significant Diagnostic Studies: Ct Angio Abdomen W/cm &/or Wo Contrast  12/04/2013   CLINICAL DATA:  Abdominal pain.  History of aneurysm.  EXAM: CT ANGIOGRAPHY ABDOMEN  TECHNIQUE: Multidetector CT imaging of the abdomen was performed using the standard protocol during bolus administration of intravenous contrast. Multiplanar reconstructed images including MIPs were obtained and reviewed to evaluate the vascular anatomy.  CONTRAST:  130mL OMNIPAQUE IOHEXOL 350 MG/ML SOLN  COMPARISON:  CT ANGIO ABDOMEN W/CM &/OR WO/CM dated 12/22/2009  FINDINGS: There is a large hiatal hernia containing proximal stomach and intraperitoneal fat. Heart is normal size. No confluent airspace opacities in the lung bases. No effusions. Calcifications noted in the visualize right coronary artery.  Liver, gallbladder, spleen, pancreas, adrenals and kidneys are unremarkable.  Patient is status post stent graft repair of an abdominal aortic aneurysm. Aneurysm sac size measures 5.1 cm maximally compared to 5.5 cm previously. No evidence of endoleak. Mesenteric vessels and renal arteries are patent without focal stenosis.  Descending colonic and sigmoid diverticulosis. No active diverticulitis. Small bowel is decompressed. No free fluid, free air or adenopathy.  No acute bony abnormality.  Review of the MIP images confirms the above findings.  IMPRESSION: Prior stent graft repair of an abdominal  aortic aneurysm. Slight decreased size of the aneurysm sac. No evidence of endoleak.  Left colonic diverticulosis.  Large hiatal hernia.  Coronary artery disease.   Electronically Signed   By: Rolm Baptise M.D.   On: 12/04/2013 06:04   Nm Gi Blood Loss  12/06/2013   CLINICAL DATA:  Gastrointestinal bleeding.  EXAM: NUCLEAR MEDICINE GASTROINTESTINAL BLEEDING SCAN  TECHNIQUE: Sequential abdominal images were obtained following intravenous administration of Tc-78m labeled red blood cells.  COMPARISON:  CT abdomen and pelvis 12/04/2013.  RADIOPHARMACEUTICALS:  [id number] MILLICURIE ULTRATAG TECHNETIUM TC 30M-LABELED RED BLOOD CELLS IV KITmCi Tc-52m in-vitro labeled red cells.  FINDINGS: No evidence of gastrointestinal bleeding is identified. Soft tissue and vascular structures demonstrate normal activity.  IMPRESSION: Negative for GI bleed.   Electronically Signed   By: Inge Rise M.D.   On: 12/06/2013 15:26   Dg Chest Portable 1 View  12/04/2013   CLINICAL DATA:  Worsening shortness of breath, history of COPD.  EXAM: PORTABLE CHEST - 1 VIEW  COMPARISON:  Chest radiograph October 15, 2013.  FINDINGS: Cardiac silhouette remains enlarged, mediastinal silhouette is nonsuspicious, status post median sternotomy. Mild chronic interstitial changes without pleural effusions or focal consolidations. Apical bullous changes. Hiatal hernia. No pneumothorax.  Remote right posterior rib fractures. Soft tissue planes are nonsuspicious. Multiple EKG lines overlie the patient and may obscure subtle underlying pathology.  IMPRESSION: Stable cardiomegaly and chronic interstitial changes without superimposed acute pulmonary process.   Electronically Signed   By: Elon Alas   On: 12/04/2013 02:30    Microbiology: Recent Results (from the past 240 hour(s))  MRSA PCR SCREENING     Status: None   Collection Time    12/04/13  6:15 AM      Result Value Ref Range Status   MRSA by PCR NEGATIVE  NEGATIVE Final   Comment:             The GeneXpert MRSA Assay (FDA     approved for NASAL specimens     only), is one component of a     comprehensive MRSA colonization     surveillance program. It  is not     intended to diagnose MRSA     infection nor to guide or     monitor treatment for     MRSA infections.     Labs: Basic Metabolic Panel:  Recent Labs Lab 12/04/13 0125  NA 137  K 5.1  CL 102  CO2 24  GLUCOSE 220*  BUN 35*  CREATININE 1.00  CALCIUM 8.6   Liver Function Tests: No results found for this basename: AST, ALT, ALKPHOS, BILITOT, PROT, ALBUMIN,  in the last 168 hours No results found for this basename: LIPASE, AMYLASE,  in the last 168 hours No results found for this basename: AMMONIA,  in the last 168 hours CBC:  Recent Labs Lab 12/04/13 0125 12/04/13 1920 12/06/13 0318 12/06/13 2248 12/08/13 0915 12/09/13 0516  WBC 18.1* 10.7* 8.6  --  7.4 6.9  NEUTROABS 15.1*  --   --   --   --   --   HGB 7.1* 8.2* 7.3* 9.2* 9.5* 9.1*  HCT 25.3* 26.7* 24.2* 29.8* 31.2* 30.3*  MCV 85.2 83.7 85.8  --  87.9 87.3  PLT 294 228 166  --  216 206   Cardiac Enzymes:  Recent Labs Lab 12/04/13 0545  TROPONINI <0.30   BNP: BNP (last 3 results)  Recent Labs  08/19/13 1531 09/07/13 2020 10/15/13 1534  PROBNP 596.9* 293.6* 487.1*   CBG:  Recent Labs Lab 12/09/13 1131 12/09/13 1629 12/09/13 2121 12/10/13 0739 12/10/13 1220  GLUCAP 237* 198* 307* 220* 296*     Signed:  CHIU, STEPHEN K  [facility] 12/10/2013, 4:22 PM

## 2013-12-10 NOTE — Progress Notes (Signed)
Came to visit patient at bedside to offer [facility] services. Consents signed. Explained to him that these services will not interfere with home health. Explained that he will receive post hospital discharge call and will be evaluated for monthly home visits. Left [facility] Care Management packet at bedside. Appreciative of visit. Made inpatient RNCM aware. Marthenia Rolling, MSN- RN,BSN- [facility] [phone]

## 2013-12-10 NOTE — Progress Notes (Signed)
Physical Therapy Treatment Patient Details Name: [name] MRN: [id number] DOB: 06/24/1941 Today's Date: 12/10/2013 Time: 9485-4627 PT Time Calculation (min): 23 min  PT Assessment / Plan / Recommendation  History of Present Illness 73 yo male admitted with acute blood loss anemia, weakness, GI hemorrhage. Hx of chronic back pain, HTN, COPD-O2 dependent, CAD, angina   PT Comments   Pt ambulated in hallway however limited distance due to bil hip pain per pt however also needed some breathing recovery time upon return to sitting EOB before return to supine.   Follow Up Recommendations  Home health PT     Does the patient have the potential to tolerate intense rehabilitation     Barriers to Discharge        Equipment Recommendations  None recommended by PT    Recommendations for Other Services    Frequency Min 3X/week   Progress towards PT Goals Progress towards PT goals: Progressing toward goals  Plan Current plan remains appropriate    Precautions / Restrictions Precautions Precautions: Fall   Pertinent Vitals/Pain Remained on 3L O2 [place]    Mobility  Bed Mobility Overal bed mobility: Needs Assistance Bed Mobility: Sit to Supine Supine to sit: Min guard General bed mobility comments: min/guard in case of need for assist as pt reports uncertainty to get back into bed Transfers Overall transfer level: Needs assistance Equipment used: None Transfers: Sit to/from Stand Sit to Stand: Supervision General transfer comment: supervision for safety Ambulation/Gait Ambulation/Gait assistance: Supervision Ambulation Distance (Feet): 220 Feet Assistive device: None Gait Pattern/deviations: Wide base of support General Gait Details: ambulated on 3L O2 [place], increased lateral sway due to obesity and pt also reports bil hip pain limiting distance (states worse if he has been resting, normally doesn't give him as much trouble at home)    Exercises General Exercises - Lower  Extremity Long Arc Quad: AROM;Both;15 reps;Seated Hip ABduction/ADduction: AROM;Both;15 reps;Seated Other Exercises Other Exercises: discussed some of his home exercises however pt felt unable to perform at this time due to hip pain, encouraged seated exercises and pt also encouraged to perform LE exercises in bed as tolerated   PT Diagnosis:    PT Problem List:   PT Treatment Interventions:     PT Goals (current goals can now be found in the care plan section) Acute Rehab PT Goals Patient Stated Goal: very agreeable to ambulate, wishes to return to HHPT  Visit Information  Last PT Received On: 12/10/13 Assistance Needed: +1 History of Present Illness: 73 yo male admitted with acute blood loss anemia, weakness, GI hemorrhage. Hx of chronic back pain, HTN, COPD-O2 dependent, CAD, angina    Subjective Data  Patient Stated Goal: very agreeable to ambulate, wishes to return to [facility]: Awake/alert Behavior During Therapy: WFL for tasks assessed/performed Overall Cognitive Status: Within Functional Limits for tasks assessed    Balance     End of Session PT - End of Session Equipment Utilized During Treatment: Oxygen Activity Tolerance: Patient limited by pain Patient left: in bed;with call bell/phone within reach   GP     Sherill Mangen,KATHrine E 12/10/2013, 2:26 PM Carmelia Bake, PT, DPT 12/10/2013 Pager: [phone]

## 2013-12-10 NOTE — Progress Notes (Signed)
Patient given discharge instructions, and verbalized an understanding of all discharge instructions.  Patient agreeable to discharge plan, and is being discharged in stable medical condition.  Patient to get transportation with wife.  Durwin Nora RN

## 2013-12-11 ENCOUNTER — Encounter: Payer: Self-pay | Admitting: Family Medicine

## 2013-12-11 ENCOUNTER — Telehealth: Payer: Self-pay | Admitting: Radiology

## 2013-12-11 ENCOUNTER — Other Ambulatory Visit: Payer: Self-pay | Admitting: Family Medicine

## 2013-12-11 NOTE — Telephone Encounter (Signed)
Today the patient came in for a UDS. While he was here , he was very loud talking about being in the hospital and everything that was done to him. Geryl Councilman, from [facility] was in the hall near the [place]. The patient told him,"he couldn't even find his genitalia to pee in a cup'. I informed the patient that in order for him to pick up his RX he needed to leave a urine sample. I gave him a urinal at his request. The patient then left unable to leave a sample, I called the front desk to let them know not to give him his RX. Ebony Hail said, "he is coming back to you now". When I went around the corner to meet the patient to see what else he needed, he was peeing in the hallway with his pants around his ankles. I told him that he was being inappropriate. He didn't stop. He then held up the urinal to ask if it was enough, still with his pants down with no attempt to pull them up.  He then sat down in the hall. I told Jaci Carrel what had happened, she went to get Levada Dy, RN, and informed Dr Deborra [name].

## 2013-12-11 NOTE — Telephone Encounter (Signed)
Will route to [place].  He needs to be discharged from this practice for inappropriate behavior.

## 2013-12-13 ENCOUNTER — Encounter: Payer: Self-pay | Admitting: Family Medicine

## 2013-12-13 ENCOUNTER — Telehealth: Payer: Self-pay | Admitting: Family Medicine

## 2013-12-13 NOTE — Telephone Encounter (Signed)
Patient dismissed from [facility] by Arnette Norris MD , effective December 13, 2013. Dismissal letter sent out by certified / registered mail. DAJ

## 2013-12-16 IMAGING — CR DG CHEST 2V
3 series · 3 of 3 positions shown · non-contrast
Comparison: 12/04/2012

CLINICAL DATA: Shortness of breath

CHEST - 2 VIEW

[w chest lat (1 of 2)]
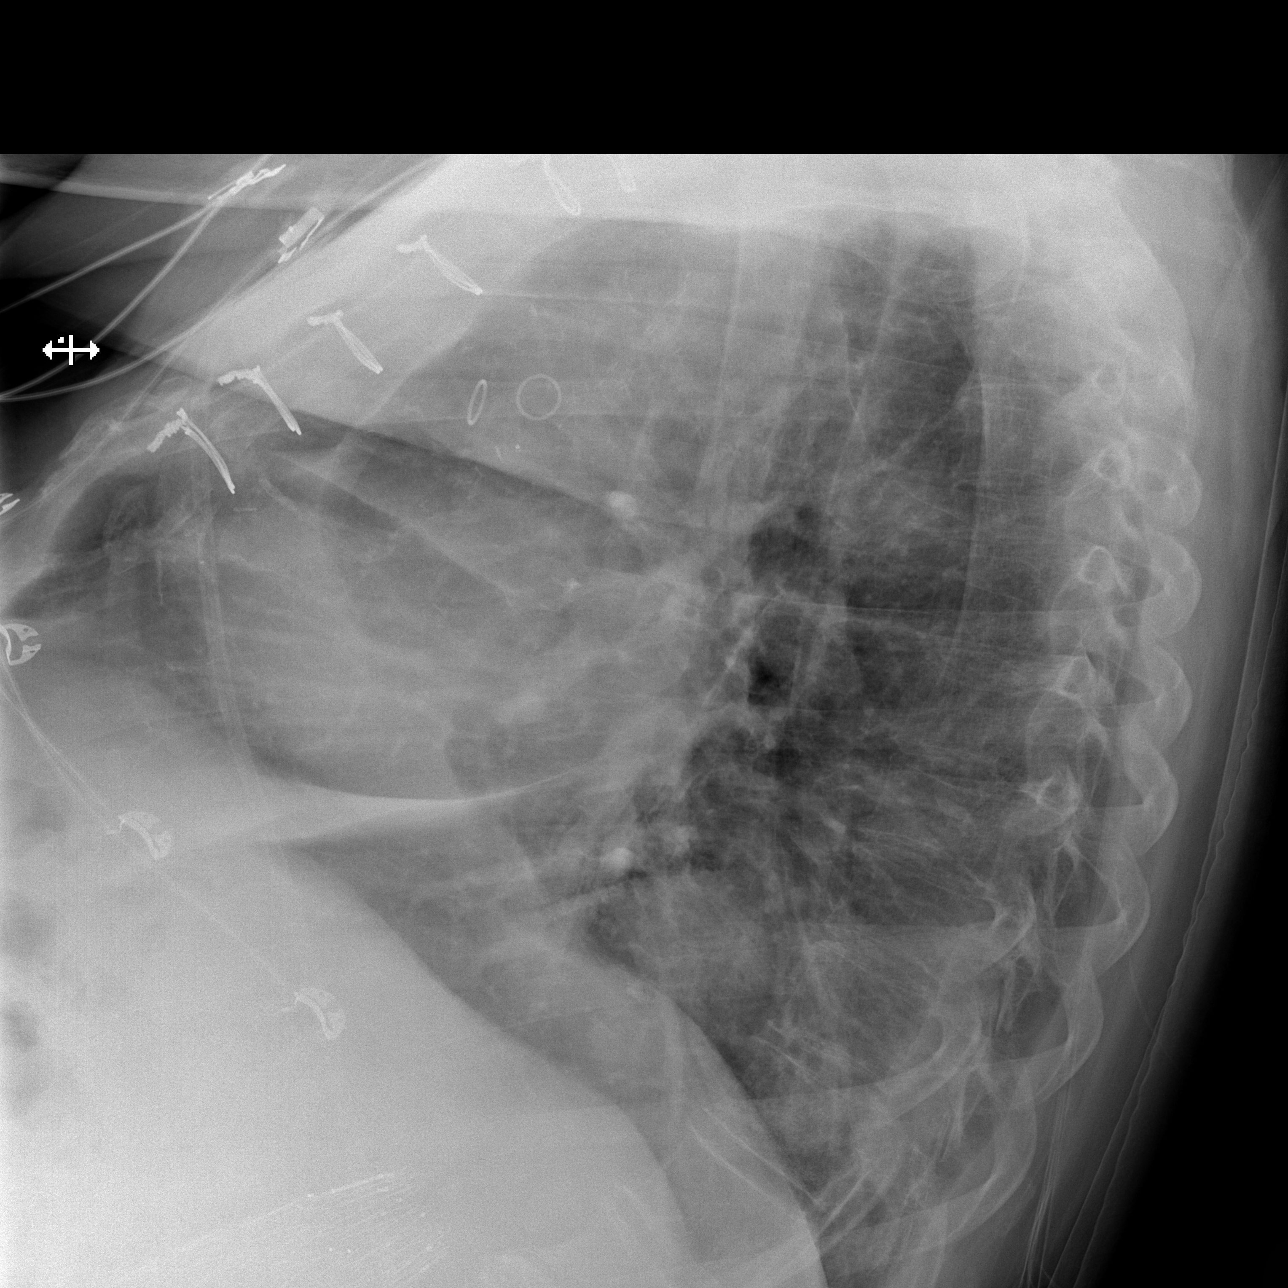

[w chest lat (2 of 2)]
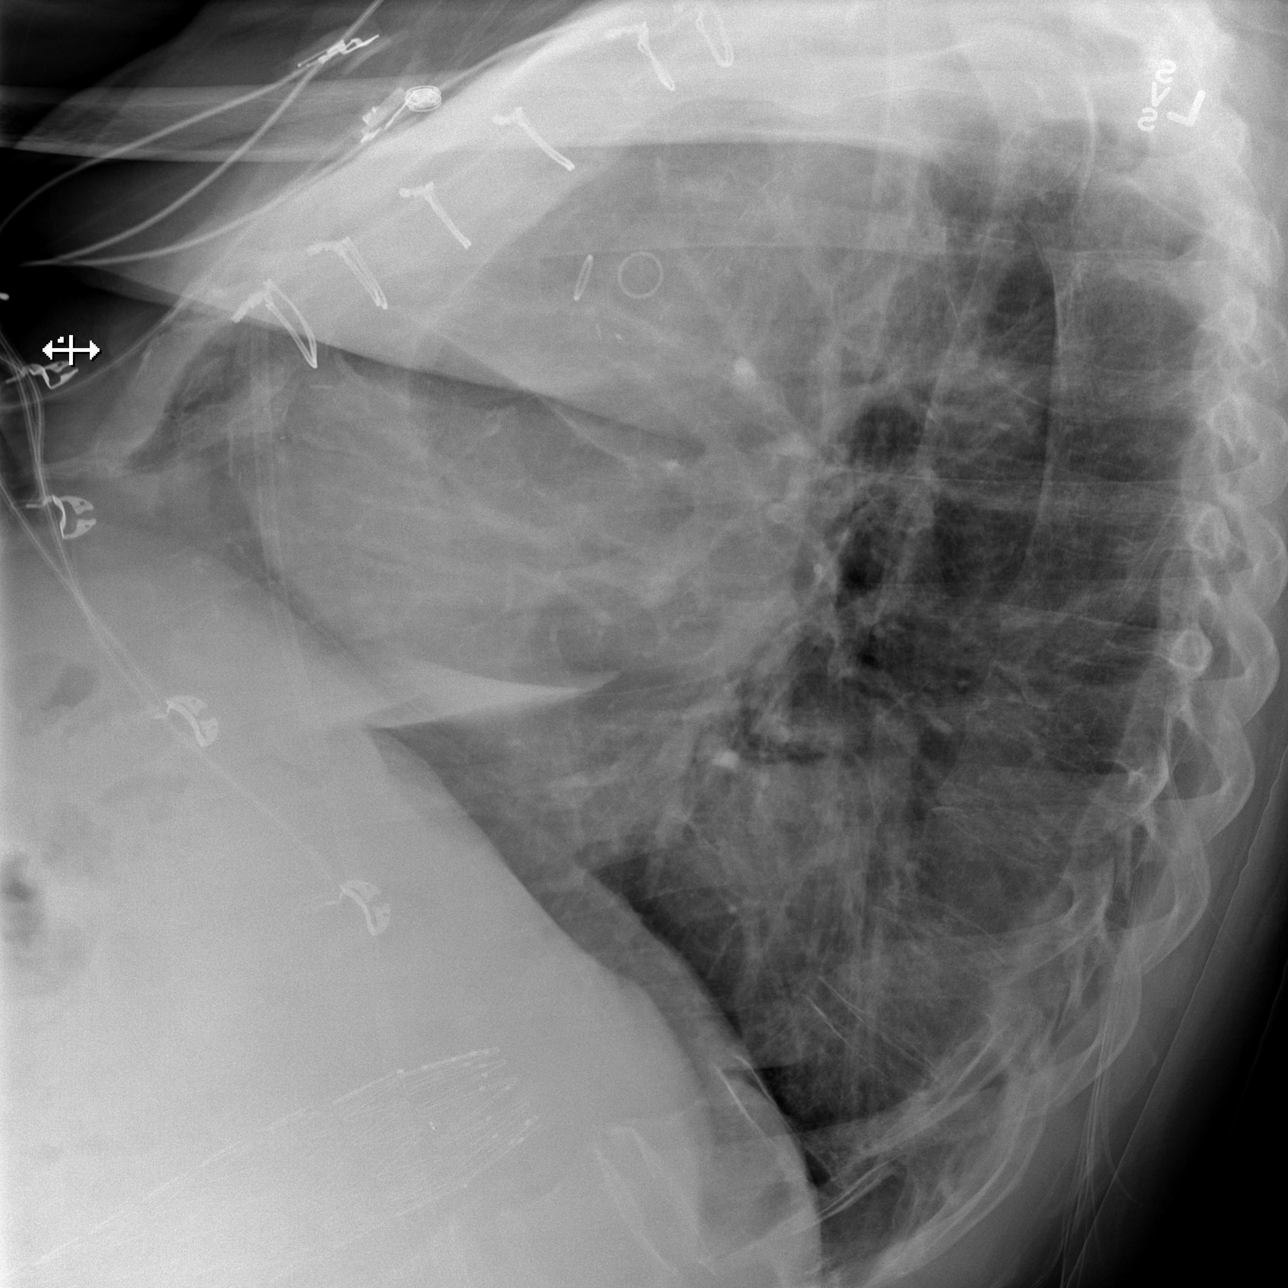

[x chest ap]
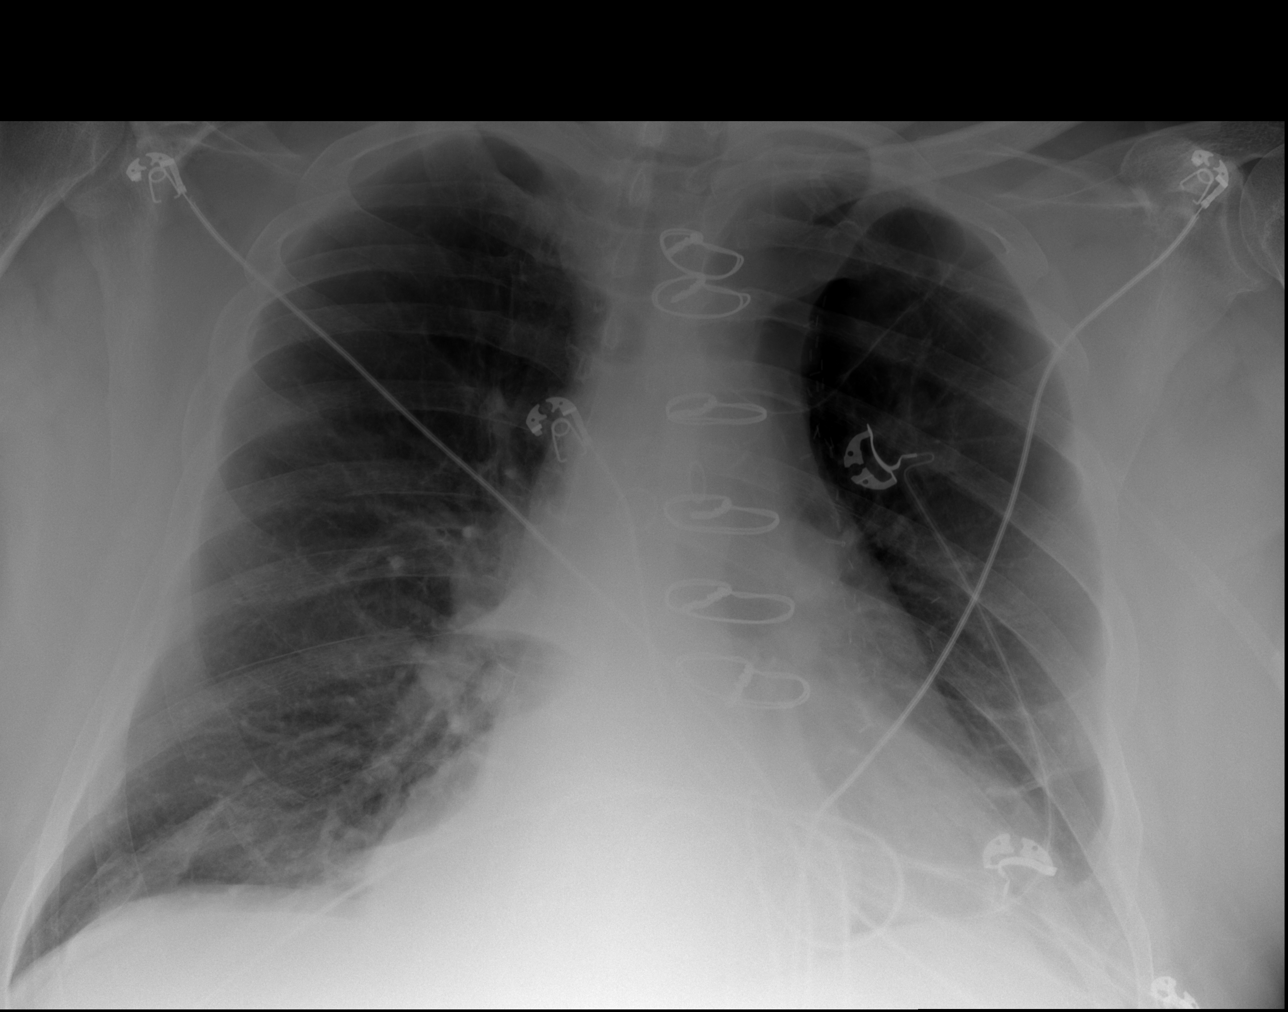

[3 of 3 positions shown; findings below may reference images not displayed]

FINDINGS: The left upper lobe bulla re-identified.  Evidence of
CABG.  Heart size mildly enlarged with central vascular congestion
but no evidence for edema.  Curvilinear right lower lobe scarring
is stable.  Hiatal hernia noted.  No acute osseous finding.
IMPRESSION: Stable right lower lobe scarring.

## 2013-12-17 NOTE — Telephone Encounter (Signed)
Pt states that he could not make it to the bathroom when he had to give his specimen.  He states that he is getting a Chief Executive Officer for [place] dismissing him from our practice.

## 2013-12-17 NOTE — Telephone Encounter (Signed)
Noted.  Please alert risk management if you feel it is necessary.

## 2013-12-18 ENCOUNTER — Other Ambulatory Visit: Payer: Self-pay | Admitting: Family Medicine

## 2013-12-18 ENCOUNTER — Ambulatory Visit: Payer: Commercial Managed Care - HMO | Admitting: Cardiovascular Disease

## 2013-12-18 NOTE — Telephone Encounter (Signed)
I am contacting risk management and I will give you any updates as they arise.

## 2013-12-20 ENCOUNTER — Telehealth: Payer: Self-pay | Admitting: Family Medicine

## 2013-12-20 ENCOUNTER — Other Ambulatory Visit: Payer: Self-pay | Admitting: Family Medicine

## 2013-12-20 NOTE — Telephone Encounter (Signed)
Lm on pts vm informing him Rx has been called in to requested pharmacy 

## 2013-12-20 NOTE — Telephone Encounter (Signed)
Rx called into requested pharmacy as advised. Lm on pts vm informing him Rx called in to requested pharmacy

## 2013-12-20 NOTE — Telephone Encounter (Signed)
Patient called to make sure his Ambien would be refilled.  The request is in [place].  Please call patient to let him know when rx is called in or if there is a problem calling in the rx.  Patient said he is making an appointment with a new doctor.

## 2013-12-20 NOTE — Telephone Encounter (Signed)
Yes ok to refill one time only. 

## 2013-12-23 ENCOUNTER — Telehealth: Payer: Self-pay | Admitting: Family Medicine

## 2013-12-23 NOTE — Telephone Encounter (Signed)
Received signed domestic return receipt verifying delivery of certified letter on December 23, 2013. Article number [id number]

## 2013-12-30 ENCOUNTER — Other Ambulatory Visit: Payer: Self-pay | Admitting: Family Medicine

## 2013-12-30 NOTE — Telephone Encounter (Signed)
Pt recently dismissed from practice 12/13/13. Pt request medication refill. Last ov 10/2013. pls advise

## 2013-12-30 NOTE — Telephone Encounter (Signed)
Ok to refill one time only.  This will give him enough until he establishes with new provider.  After this month, we will be refusing all refills.

## 2013-12-30 NOTE — Telephone Encounter (Signed)
Spoke to pt and informed him Rx has been called in to requested pharmacy. Pt advised he will no longer receive this medication from our office and will need to establish care with a practice in order to receive all future meds.

## 2013-12-31 DIAGNOSIS — Z5189 Encounter for other specified aftercare: Secondary | ICD-10-CM

## 2013-12-31 DIAGNOSIS — E119 Type 2 diabetes mellitus without complications: Secondary | ICD-10-CM

## 2013-12-31 DIAGNOSIS — D649 Anemia, unspecified: Secondary | ICD-10-CM

## 2013-12-31 DIAGNOSIS — Q438 Other specified congenital malformations of intestine: Secondary | ICD-10-CM

## 2014-01-01 ENCOUNTER — Encounter: Payer: Self-pay | Admitting: Family Medicine

## 2014-01-10 ENCOUNTER — Other Ambulatory Visit: Payer: Self-pay | Admitting: *Deleted

## 2014-01-10 MED ORDER — PANTOPRAZOLE SODIUM 40 MG PO TBEC: 40.0000 mg | DELAYED_RELEASE_TABLET | Freq: Two times a day (BID) | ORAL | Status: DC

## 2014-01-10 NOTE — Telephone Encounter (Signed)
Rx was sent to pharmacy electronically. 

## 2014-01-14 ENCOUNTER — Ambulatory Visit (INDEPENDENT_AMBULATORY_CARE_PROVIDER_SITE_OTHER): Payer: Commercial Managed Care - HMO | Admitting: Cardiovascular Disease

## 2014-01-14 ENCOUNTER — Encounter: Payer: Self-pay | Admitting: Cardiovascular Disease

## 2014-01-14 VITALS — BP 110/60 | HR 81 | Ht 75.0 in | Wt 262.0 lb

## 2014-01-14 DIAGNOSIS — I2510 Atherosclerotic heart disease of native coronary artery without angina pectoris: Secondary | ICD-10-CM

## 2014-01-14 DIAGNOSIS — E782 Mixed hyperlipidemia: Secondary | ICD-10-CM

## 2014-01-14 DIAGNOSIS — E785 Hyperlipidemia, unspecified: Secondary | ICD-10-CM

## 2014-01-14 DIAGNOSIS — Z79899 Other long term (current) drug therapy: Secondary | ICD-10-CM

## 2014-01-14 DIAGNOSIS — I10 Essential (primary) hypertension: Secondary | ICD-10-CM

## 2014-01-14 MED ORDER — METOPROLOL TARTRATE 25 MG PO TABS: 25.0000 mg | ORAL_TABLET | Freq: Two times a day (BID) | ORAL | Status: DC

## 2014-01-14 MED ORDER — ATORVASTATIN CALCIUM 40 MG PO TABS: 40.0000 mg | ORAL_TABLET | Freq: Every day | ORAL | Status: DC

## 2014-01-14 NOTE — Assessment & Plan Note (Signed)
Status post myocardial infarction in the past cardiac catheterization performed by Dr. Rollene Fare 12/16/08 revealing three-vessel disease with moderate LV dysfunction. He'll underwent coronary bypass grafting x4 by Dr. Tharon Aquas Trigt . He has not had a cardiac catheterization or a functional study since. He is chronically short of breath because of COPD but denies chest pain.

## 2014-01-14 NOTE — Assessment & Plan Note (Signed)
Endoluminal stent graft in for abdominal aortic aneurysm by Dr. Annamarie Major , in 2009 followed noninvasively in his office.

## 2014-01-14 NOTE — Assessment & Plan Note (Signed)
Controlled on current medications 

## 2014-01-14 NOTE — Patient Instructions (Signed)
  We will see you back in follow up in 6 months with Dr Gwenlyn Found  Dr Gwenlyn Found has ordered blood work to be done.

## 2014-01-14 NOTE — Assessment & Plan Note (Signed)
On statin therapy followed by his PCP 

## 2014-01-14 NOTE — Progress Notes (Signed)
01/14/2014 [name]   1941-07-11  [id number]  Primary Physician Pcp Not In System Primary Cardiologist: Lorretta Harp MD Renae Gloss   HPI:  [name] is a 73 year old moderately overweight married Caucasian male with no children who is a retired Designer, industrial/product and Production manager for [employer]. He was formally a patient of Dr. Terance Ice xylan assuming his care. His cardiovascular risk factor profile is remarkable for 100-200 pack years of tobacco abuse having quit 4 years ago. He is treated non-in requiring diabetes, hypertension and hyperlipidemia. He has had an MI in the past and catheter in 2010 revealing 2 vessel disease and moderate LV dysfunction ultimately requiring coronary bypass grafting x4 by Dr. Tharon Aquas Trigt. . He had an abdominal aortic aneurysm revascularize with an endoluminal stent graft by Dre. Brabvham  In 2009 which he follows an obviously in his office.he is on O2 and severely functionally limited by shortness of breath related to COPD. Had recent admissions for GI bleed related to AVMs which have been cauterized   Current Outpatient Prescriptions  Medication Sig Dispense Refill  . albuterol (PROVENTIL HFA;VENTOLIN HFA) 108 (90 BASE) MCG/ACT inhaler Inhale 2 puffs into the lungs every 6 (six) hours as needed for wheezing.      Marland Kitchen ALPRAZolam (XANAX) 0.25 MG tablet TAKE 1 TABLET BY MOUTH 3 TIMES A DAY AS NEEDED ANXIETY  30 tablet  0  . atorvastatin (LIPITOR) 40 MG tablet Take 1 tablet (40 mg total) by mouth at bedtime.  90 tablet  11  . budesonide-formoterol (SYMBICORT) 160-4.5 MCG/ACT inhaler Inhale 2 puffs into the lungs 2 (two) times daily.      . citalopram (CELEXA) 20 MG tablet Take 1 tablet (20 mg total) by mouth daily.  30 tablet  3  . clobetasol ointment (TEMOVATE) 5.18 % Apply 1 application topically 2 (two) times daily as needed (rash).       . folic acid (FOLVITE) 1 MG tablet Take 2 mg by mouth every morning.       Marland Kitchen  HYDROcodone-acetaminophen (NORCO/VICODIN) 5-325 MG per tablet Take 1 tablet by mouth every 6 (six) hours as needed for moderate pain.  60 tablet  0  . ibuprofen (ADVIL,MOTRIN) 800 MG tablet Take 800 mg by mouth once.      . iron polysaccharides (NIFEREX) 150 MG capsule Take 150 mg by mouth 2 (two) times daily.      . metFORMIN (GLUCOPHAGE) 500 MG tablet Take 500 mg by mouth 2 (two) times daily with a meal.      . metFORMIN (GLUCOPHAGE) 500 MG tablet TAKE 1 TABLET (500 MG TOTAL) BY MOUTH 2 (TWO) TIMES DAILY WITH A MEAL.  60 tablet  5  . metoprolol tartrate (LOPRESSOR) 25 MG tablet Take 1 tablet (25 mg total) by mouth 2 (two) times daily.  180 tablet  3  . pantoprazole (PROTONIX) 40 MG tablet Take 1 tablet (40 mg total) by mouth 2 (two) times daily. Must Keep Appointment w/Dr. Gwenlyn Found  60 tablet  0  . tiotropium (SPIRIVA) 18 MCG inhalation capsule Place 18 mcg into inhaler and inhale daily.      Marland Kitchen zolpidem (AMBIEN) 10 MG tablet TAKE 1 TABLET BY MOUTH AT BEDTIME  30 tablet  0   No current facility-administered medications for this visit.    Allergies  Allergen Reactions  . Penicillins Anaphylaxis and Hives  . Demerol [Meperidine] Other (See Comments)    hallucinations  . Dilaudid [Hydromorphone Hcl] Other (See Comments)  hallucinations  . Levaquin [Levofloxacin In D5w]     Nausea and diarrhea  . Morphine And Related Nausea Only and Other (See Comments)    hallucinations    History   Social History  . Marital Status: Married    Spouse Name: N/A    Number of Children: N/A  . Years of Education: N/A   Occupational History  . Retired    Social History Main Topics  . Smoking status: Former Smoker -- 2.00 packs/day for 50 years    Types: Cigarettes    Quit date: 11/18/2009  . Smokeless tobacco: Never Used  . Alcohol Use: No     Comment: quit 7 years ago  . Drug Use: No  . Sexual Activity: No   Other Topics Concern  . Not on file   Social History Narrative   LIves at home  with his wife.       Review of Systems: General: negative for chills, fever, night sweats or weight changes.  Cardiovascular: negative for chest pain, dyspnea on exertion, edema, orthopnea, palpitations, paroxysmal nocturnal dyspnea or shortness of breath Dermatological: negative for rash Respiratory: negative for cough or wheezing Urologic: negative for hematuria Abdominal: negative for nausea, vomiting, diarrhea, bright red blood per rectum, melena, or hematemesis Neurologic: negative for visual changes, syncope, or dizziness All other systems reviewed and are otherwise negative except as noted above.    Blood pressure 110/60, pulse 81, height 6\' 3"  (1.905 m), weight 262 lb (118.842 kg).  General appearance: alert and no distress Neck: no adenopathy, no carotid bruit, no JVD, supple, symmetrical, trachea midline and thyroid not enlarged, symmetric, no tenderness/mass/nodules Lungs: clear to auscultation bilaterally Heart: regular rate and rhythm, S1, S2 normal, no murmur, click, rub or gallop Extremities: extremities normal, atraumatic, no cyanosis or edema  EKG normal sinus rhythm at 80 with heart bundle-branch block  ASSESSMENT AND PLAN:   AAA (abdominal aortic aneurysm) Endoluminal stent graft in for abdominal aortic aneurysm by Dr. Annamarie Major , in 2009 followed noninvasively in his office.  CAD (coronary artery disease) Status post myocardial infarction in the past cardiac catheterization performed by Dr. Rollene Fare 12/16/08 revealing three-vessel disease with moderate LV dysfunction. He'll underwent coronary bypass grafting x4 by Dr. Tharon Aquas Trigt . He has not had a cardiac catheterization or a functional study since. He is chronically short of breath because of COPD but denies chest pain.  HYPERLIPIDEMIA On statin therapy followed by his PCP  HYPERTENSION Controlled on current medications      Lorretta Harp MD [facility] 01/14/2014 4:30 PM

## 2014-01-20 ENCOUNTER — Telehealth: Payer: Self-pay

## 2014-01-20 ENCOUNTER — Other Ambulatory Visit: Payer: Self-pay | Admitting: Family Medicine

## 2014-01-20 NOTE — Telephone Encounter (Signed)
[name] call the office to schedule as a New Patient, he was informing the schedule why he needed to change providers. [name] scheduled the appointment, not knowing at the time patient was discharged from [facility]. Based on the information documented in [name], chart

## 2014-02-28 ENCOUNTER — Telehealth: Payer: Self-pay | Admitting: Cardiovascular Disease

## 2014-02-28 MED ORDER — METOPROLOL TARTRATE 25 MG PO TABS: 25.0000 mg | ORAL_TABLET | Freq: Two times a day (BID) | ORAL | Status: DC

## 2014-02-28 MED ORDER — FOLIC ACID 1 MG PO TABS: 2.0000 mg | ORAL_TABLET | Freq: Every morning | ORAL | Status: DC

## 2014-02-28 MED ORDER — PANTOPRAZOLE SODIUM 40 MG PO TBEC: 40.0000 mg | DELAYED_RELEASE_TABLET | Freq: Two times a day (BID) | ORAL | Status: DC

## 2014-02-28 NOTE — Telephone Encounter (Signed)
Please call, pt is out of his Metoporolol 25 mg,Folic Acid 1 mg,Pantoprazole. The  Metoprolol and Folic Acid was prescribed by his former primary care doctor. He no longer have a primary care doctor and have been unable to get one. He need these medicine and want to know if Dr Gwenlyn Found will call all three of them in.

## 2014-03-06 LAB — HEPATIC FUNCTION PANEL
ALT: 10 U/L (ref 0–53)
AST: 12 U/L (ref 0–37)
Albumin: 4.1 g/dL (ref 3.5–5.2)
Alkaline Phosphatase: 145 U/L — ABNORMAL HIGH (ref 39–117)
Bilirubin, Direct: 0.1 mg/dL (ref 0.0–0.3)
Indirect Bilirubin: 0.3 mg/dL (ref 0.2–1.2)
TOTAL PROTEIN: 6.7 g/dL (ref 6.0–8.3)
Total Bilirubin: 0.4 mg/dL (ref 0.2–1.2)

## 2014-03-06 LAB — LIPID PANEL
Cholesterol: 109 mg/dL (ref 0–200)
HDL: 39 mg/dL — AB (ref >39)
LDL Cholesterol: 36 mg/dL (ref 0–99)
TRIGLYCERIDES: 172 mg/dL — AB (ref <150)
Total CHOL/HDL Ratio: 2.8 Ratio
VLDL: 34 mg/dL (ref 0–40)

## 2014-03-13 NOTE — Progress Notes (Signed)
Pt. Informed of his lab test

## 2014-03-14 ENCOUNTER — Telehealth: Payer: Self-pay | Admitting: Pulmonary Disease

## 2014-03-14 ENCOUNTER — Telehealth: Payer: Self-pay | Admitting: *Deleted

## 2014-03-14 MED ORDER — ALPRAZOLAM 0.5 MG PO TABS: 0.5000 mg | ORAL_TABLET | Freq: Three times a day (TID) | ORAL | Status: DC | PRN

## 2014-03-14 NOTE — Telephone Encounter (Signed)
Pt wife aware of recs per BQ- will relay to husband Pt wife states that they are trying very hard to get in with a new PCP Aware that Rx to be sent to [place]-- phoned in  Nothing further needed.

## 2014-03-14 NOTE — Telephone Encounter (Signed)
OK to write for Xanax 0.5mg  po tid prn anxiety Dispense #20, refill 0 Must find PCP for further refills

## 2014-03-14 NOTE — Telephone Encounter (Signed)
Late entry pt. Called on 5-28/2015 and informed about his lab results

## 2014-03-14 NOTE — Telephone Encounter (Signed)
Spoke with pt, states that he has been dealing with more frequent panic attacks Xfew days.  He states that he was discharged from his PCP and is having a difficult time finding a PCP that will take him on as a pt.  He is requesting a refill on his Xanax until he can find a PCP.  BQ please advise.

## 2014-03-18 ENCOUNTER — Encounter (HOSPITAL_COMMUNITY): Payer: Self-pay | Admitting: Emergency Medicine

## 2014-03-18 ENCOUNTER — Inpatient Hospital Stay (HOSPITAL_COMMUNITY)
Admission: EM | Admit: 2014-03-18 | Discharge: 2014-03-22 | DRG: 378 | Disposition: A | Discharge status: Home / Self Care | Payer: Medicare HMO | Attending: Internal Medicine | Admitting: Internal Medicine

## 2014-03-18 DIAGNOSIS — I219 Acute myocardial infarction, unspecified: Secondary | ICD-10-CM

## 2014-03-18 DIAGNOSIS — Z9981 Dependence on supplemental oxygen: Secondary | ICD-10-CM

## 2014-03-18 DIAGNOSIS — F32A Depression, unspecified: Secondary | ICD-10-CM

## 2014-03-18 DIAGNOSIS — Z951 Presence of aortocoronary bypass graft: Secondary | ICD-10-CM

## 2014-03-18 DIAGNOSIS — J4489 Other specified chronic obstructive pulmonary disease: Secondary | ICD-10-CM | POA: Diagnosis present

## 2014-03-18 DIAGNOSIS — J449 Chronic obstructive pulmonary disease, unspecified: Secondary | ICD-10-CM

## 2014-03-18 DIAGNOSIS — I2510 Atherosclerotic heart disease of native coronary artery without angina pectoris: Secondary | ICD-10-CM

## 2014-03-18 DIAGNOSIS — Z833 Family history of diabetes mellitus: Secondary | ICD-10-CM

## 2014-03-18 DIAGNOSIS — J9610 Chronic respiratory failure, unspecified whether with hypoxia or hypercapnia: Secondary | ICD-10-CM | POA: Diagnosis present

## 2014-03-18 DIAGNOSIS — J9611 Chronic respiratory failure with hypoxia: Secondary | ICD-10-CM

## 2014-03-18 DIAGNOSIS — I499 Cardiac arrhythmia, unspecified: Secondary | ICD-10-CM

## 2014-03-18 DIAGNOSIS — Z7982 Long term (current) use of aspirin: Secondary | ICD-10-CM

## 2014-03-18 DIAGNOSIS — K31811 Angiodysplasia of stomach and duodenum with bleeding: Secondary | ICD-10-CM

## 2014-03-18 DIAGNOSIS — I10 Essential (primary) hypertension: Secondary | ICD-10-CM

## 2014-03-18 DIAGNOSIS — R109 Unspecified abdominal pain: Secondary | ICD-10-CM

## 2014-03-18 DIAGNOSIS — K5520 Angiodysplasia of colon without hemorrhage: Secondary | ICD-10-CM

## 2014-03-18 DIAGNOSIS — R5383 Other fatigue: Secondary | ICD-10-CM

## 2014-03-18 DIAGNOSIS — D72829 Elevated white blood cell count, unspecified: Secondary | ICD-10-CM

## 2014-03-18 DIAGNOSIS — D500 Iron deficiency anemia secondary to blood loss (chronic): Secondary | ICD-10-CM

## 2014-03-18 DIAGNOSIS — F329 Major depressive disorder, single episode, unspecified: Secondary | ICD-10-CM

## 2014-03-18 DIAGNOSIS — E119 Type 2 diabetes mellitus without complications: Secondary | ICD-10-CM

## 2014-03-18 DIAGNOSIS — E785 Hyperlipidemia, unspecified: Secondary | ICD-10-CM

## 2014-03-18 DIAGNOSIS — K922 Gastrointestinal hemorrhage, unspecified: Secondary | ICD-10-CM

## 2014-03-18 DIAGNOSIS — I5030 Unspecified diastolic (congestive) heart failure: Secondary | ICD-10-CM

## 2014-03-18 DIAGNOSIS — K921 Melena: Principal | ICD-10-CM

## 2014-03-18 DIAGNOSIS — D649 Anemia, unspecified: Secondary | ICD-10-CM

## 2014-03-18 DIAGNOSIS — Z79899 Other long term (current) drug therapy: Secondary | ICD-10-CM

## 2014-03-18 DIAGNOSIS — Z87891 Personal history of nicotine dependence: Secondary | ICD-10-CM

## 2014-03-18 DIAGNOSIS — K317 Polyp of stomach and duodenum: Secondary | ICD-10-CM

## 2014-03-18 DIAGNOSIS — F411 Generalized anxiety disorder: Secondary | ICD-10-CM

## 2014-03-18 DIAGNOSIS — I714 Abdominal aortic aneurysm, without rupture, unspecified: Secondary | ICD-10-CM

## 2014-03-18 DIAGNOSIS — H6120 Impacted cerumen, unspecified ear: Secondary | ICD-10-CM

## 2014-03-18 DIAGNOSIS — I252 Old myocardial infarction: Secondary | ICD-10-CM

## 2014-03-18 DIAGNOSIS — Z48812 Encounter for surgical aftercare following surgery on the circulatory system: Secondary | ICD-10-CM

## 2014-03-18 DIAGNOSIS — Z8249 Family history of ischemic heart disease and other diseases of the circulatory system: Secondary | ICD-10-CM

## 2014-03-18 DIAGNOSIS — D62 Acute posthemorrhagic anemia: Secondary | ICD-10-CM

## 2014-03-18 DIAGNOSIS — J309 Allergic rhinitis, unspecified: Secondary | ICD-10-CM

## 2014-03-18 DIAGNOSIS — K625 Hemorrhage of anus and rectum: Secondary | ICD-10-CM

## 2014-03-18 LAB — CBC
HCT: 28.9 % — ABNORMAL LOW (ref 39.0–52.0)
HEMATOCRIT: 28.9 % — AB (ref 39.0–52.0)
Hemoglobin: 8.2 g/dL — ABNORMAL LOW (ref 13.0–17.0)
Hemoglobin: 8.4 g/dL — ABNORMAL LOW (ref 13.0–17.0)
MCH: 22.4 pg — ABNORMAL LOW (ref 26.0–34.0)
MCH: 23 pg — ABNORMAL LOW (ref 26.0–34.0)
MCHC: 28.4 g/dL — ABNORMAL LOW (ref 30.0–36.0)
MCHC: 29.1 g/dL — ABNORMAL LOW (ref 30.0–36.0)
MCV: 79 fL (ref 78.0–100.0)
MCV: 79 fL (ref 78.0–100.0)
Platelets: 316 10*3/uL (ref 150–400)
Platelets: 238 10*3/uL (ref 150–400)
RBC: 3.66 MIL/uL — AB (ref 4.22–5.81)
RBC: 3.66 MIL/uL — AB (ref 4.22–5.81)
RDW: 16.7 % — ABNORMAL HIGH (ref 11.5–15.5)
RDW: 16.3 % — ABNORMAL HIGH (ref 11.5–15.5)
WBC: 7.9 10*3/uL (ref 4.0–10.5)
WBC: 7.4 10*3/uL (ref 4.0–10.5)

## 2014-03-18 LAB — COMPREHENSIVE METABOLIC PANEL
ALT: 12 U/L (ref 0–53)
AST: 11 U/L (ref 0–37)
Albumin: 3.3 g/dL — ABNORMAL LOW (ref 3.5–5.2)
Alkaline Phosphatase: 134 U/L — ABNORMAL HIGH (ref 39–117)
BUN: 17 mg/dL (ref 6–23)
CALCIUM: 8.9 mg/dL (ref 8.4–10.5)
CHLORIDE: 98 meq/L (ref 96–112)
CO2: 24 mEq/L (ref 19–32)
Creatinine, Ser: 0.96 mg/dL (ref 0.50–1.35)
GFR calc Af Amer: >90 mL/min (ref >90)
GFR, EST NON AFRICAN AMERICAN: 81 mL/min — AB (ref >90)
Glucose, Bld: 315 mg/dL — ABNORMAL HIGH (ref 70–99)
Potassium: 4.8 mEq/L (ref 3.7–5.3)
SODIUM: 135 meq/L — AB (ref 137–147)
Total Bilirubin: 0.3 mg/dL (ref 0.3–1.2)
Total Protein: 6.8 g/dL (ref 6.0–8.3)

## 2014-03-18 LAB — LACTIC ACID, PLASMA: Lactic Acid, Venous: 2.7 mmol/L — ABNORMAL HIGH (ref 0.5–2.2)

## 2014-03-18 LAB — POC OCCULT BLOOD, ED: Fecal Occult Bld: POSITIVE — AB

## 2014-03-18 LAB — GLUCOSE, CAPILLARY
GLUCOSE-CAPILLARY: 166 mg/dL — AB (ref 70–99)
GLUCOSE-CAPILLARY: 184 mg/dL — AB (ref 70–99)

## 2014-03-18 LAB — I-STAT CG4 LACTIC ACID, ED: Lactic Acid, Venous: 4.05 mmol/L — ABNORMAL HIGH (ref 0.5–2.2)

## 2014-03-18 LAB — PREPARE RBC (CROSSMATCH)

## 2014-03-18 MED ORDER — BUDESONIDE-FORMOTEROL FUMARATE 160-4.5 MCG/ACT IN AERO
2.0000 | INHALATION_SPRAY | Freq: Two times a day (BID) | RESPIRATORY_TRACT | Status: DC
  Administered 2014-03-18 – 2014-03-22 (×8): 2 via RESPIRATORY_TRACT
  Filled 2014-03-18: qty 6

## 2014-03-18 MED ORDER — ONDANSETRON HCL 4 MG PO TABS
4.0000 mg | ORAL_TABLET | Freq: Four times a day (QID) | ORAL | Status: DC | PRN
Start: 2014-03-18 — End: 2014-03-22

## 2014-03-18 MED ORDER — ATORVASTATIN CALCIUM 40 MG PO TABS
40.0000 mg | ORAL_TABLET | Freq: Every day | ORAL | Status: DC
  Administered 2014-03-18 – 2014-03-21 (×4): 40 mg via ORAL
  Filled 2014-03-18 (×5): qty 1

## 2014-03-18 MED ORDER — PANTOPRAZOLE SODIUM 40 MG IV SOLR
40.0000 mg | Freq: Two times a day (BID) | INTRAVENOUS | Status: DC
  Administered 2014-03-18 – 2014-03-21 (×8): 40 mg via INTRAVENOUS
  Filled 2014-03-18 (×10): qty 40

## 2014-03-18 MED ORDER — LORAZEPAM 1 MG PO TABS
1.0000 mg | ORAL_TABLET | Freq: Once | ORAL | Status: AC
  Administered 2014-03-18: 1 mg via ORAL
  Filled 2014-03-18: qty 1

## 2014-03-18 MED ORDER — FOLIC ACID 1 MG PO TABS
2.0000 mg | ORAL_TABLET | Freq: Every morning | ORAL | Status: DC
  Administered 2014-03-19 – 2014-03-22 (×3): 2 mg via ORAL
  Filled 2014-03-18 (×4): qty 2

## 2014-03-18 MED ORDER — SODIUM CHLORIDE 0.9 % IV BOLUS (SEPSIS)
1000.0000 mL | Freq: Once | INTRAVENOUS | Status: AC
  Administered 2014-03-18: 1000 mL via INTRAVENOUS

## 2014-03-18 MED ORDER — ALPRAZOLAM 0.5 MG PO TABS
0.5000 mg | ORAL_TABLET | Freq: Three times a day (TID) | ORAL | Status: DC | PRN
  Filled 2014-03-18: qty 1

## 2014-03-18 MED ORDER — METOPROLOL TARTRATE 25 MG PO TABS
25.0000 mg | ORAL_TABLET | Freq: Two times a day (BID) | ORAL | Status: DC
  Administered 2014-03-18 – 2014-03-22 (×8): 25 mg via ORAL
  Filled 2014-03-18 (×10): qty 1

## 2014-03-18 MED ORDER — CITALOPRAM HYDROBROMIDE 20 MG PO TABS
20.0000 mg | ORAL_TABLET | Freq: Every day | ORAL | Status: DC
  Administered 2014-03-18 – 2014-03-22 (×4): 20 mg via ORAL
  Filled 2014-03-18 (×5): qty 1

## 2014-03-18 MED ORDER — PANTOPRAZOLE SODIUM 40 MG IV SOLR
40.0000 mg | Freq: Once | INTRAVENOUS | Status: AC
  Administered 2014-03-18: 40 mg via INTRAVENOUS
  Filled 2014-03-18: qty 40

## 2014-03-18 MED ORDER — ZOLPIDEM TARTRATE 5 MG PO TABS
5.0000 mg | ORAL_TABLET | Freq: Every evening | ORAL | Status: DC | PRN
  Administered 2014-03-19 – 2014-03-21 (×5): 5 mg via ORAL
  Filled 2014-03-18 (×4): qty 1

## 2014-03-18 MED ORDER — INSULIN ASPART 100 UNIT/ML ~~LOC~~ SOLN
0.0000 [IU] | Freq: Three times a day (TID) | SUBCUTANEOUS | Status: DC
  Administered 2014-03-18 – 2014-03-19 (×2): 2 [IU] via SUBCUTANEOUS
  Administered 2014-03-19: 3 [IU] via SUBCUTANEOUS
  Administered 2014-03-20: 2 [IU] via SUBCUTANEOUS
  Administered 2014-03-20: 1 [IU] via SUBCUTANEOUS
  Administered 2014-03-20 – 2014-03-22 (×5): 2 [IU] via SUBCUTANEOUS

## 2014-03-18 MED ORDER — ALBUTEROL SULFATE (2.5 MG/3ML) 0.083% IN NEBU: 3.0000 mL | INHALATION_SOLUTION | Freq: Four times a day (QID) | RESPIRATORY_TRACT | Status: DC | PRN

## 2014-03-18 MED ORDER — SODIUM CHLORIDE 0.9 % IV SOLN
INTRAVENOUS | Status: DC
  Administered 2014-03-18 (×2): via INTRAVENOUS
  Administered 2014-03-19: 75 mL/h via INTRAVENOUS
  Administered 2014-03-19 – 2014-03-22 (×5): via INTRAVENOUS

## 2014-03-18 MED ORDER — TIOTROPIUM BROMIDE MONOHYDRATE 18 MCG IN CAPS
18.0000 ug | ORAL_CAPSULE | Freq: Every day | RESPIRATORY_TRACT | Status: DC
  Administered 2014-03-19 – 2014-03-22 (×4): 18 ug via RESPIRATORY_TRACT
  Filled 2014-03-18: qty 5

## 2014-03-18 MED ORDER — SODIUM CHLORIDE 0.9 % IJ SOLN
3.0000 mL | Freq: Two times a day (BID) | INTRAMUSCULAR | Status: DC
Start: 2014-03-18 — End: 2014-03-22
  Administered 2014-03-18 – 2014-03-21 (×4): 3 mL via INTRAVENOUS

## 2014-03-18 MED ORDER — HYDROCODONE-ACETAMINOPHEN 5-325 MG PO TABS
1.0000 | ORAL_TABLET | Freq: Four times a day (QID) | ORAL | Status: DC | PRN
  Administered 2014-03-19 – 2014-03-21 (×5): 1 via ORAL
  Filled 2014-03-18 (×5): qty 1

## 2014-03-18 MED ORDER — ONDANSETRON HCL 4 MG/2ML IJ SOLN: 4.0000 mg | Freq: Four times a day (QID) | INTRAMUSCULAR | Status: DC | PRN

## 2014-03-18 NOTE — ED Notes (Signed)
Bed: WA14 Expected date:  Expected time:  Means of arrival:  Comments: EMS - rectal bleeding 

## 2014-03-18 NOTE — Progress Notes (Signed)
  CARE MANAGEMENT ED NOTE 03/18/2014  Patient:  [name], [name]   Account Number:  [id number]  Date Initiated:  03/18/2014  Documentation initiated by:  Jackelyn Poling  Subjective/Objective Assessment:   73 yr old [place] medicare choice p[place] pt hx of GI bleeds. Took ASA yesterday & had dark colored stools this am     Subjective/Objective Assessment Detail:   [facility] admission RN mentioned pt was scheduled to see Dr Charlies Silvers 03/18/14  Pt reports he was attempting to find office of new pcp Dr Montez Morita at [facility] but had difficulty finding the facility and once he found it he "lost my bowels" He states he is being told Dr Montez Morita will be retiring and he "hope I have not been sent to a retiring doctor" Pt preference is to contact his "[facility]" staff to get assist with finding a pcp Informed ED CM he is "not allowed to be seen by anyone in the [place] group"     Action/Plan:   ED CM reviewed EPIC spoke with admission RN. Spoke with Dr Hartford Poli about list of pcp for pt, spoke with pt Provided him with a list of in network [employer] using his [employer] ID provided in EPIC.   Action/Plan Detail:   Placed list in personal belonging bag for pt & wife   Anticipated DC Date:       Status Recommendation to Physician:   Result of Recommendation:    Other ED [place]  Other  PCP issues  Outpatient Services - Pt will follow up    Choice offered to / List presented to:            Status of service:  Completed, signed off  ED Comments:   ED Comments Detail:

## 2014-03-18 NOTE — Progress Notes (Signed)
UR completed 
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
## 2014-03-18 NOTE — ED Notes (Signed)
Per EMS, pt has hx of GI bleeds. Pt took aspirin yesterday and had dark colored stools this morning. Pt planned to go to primary care today but lost control of bowels. Pt had blood work yesterday, Hgb 9. Pt denies abdominal pain, emesis.

## 2014-03-18 NOTE — Progress Notes (Signed)
Patient is demanding food and demanding Dr. Hartford Poli be told " I am starving and its not right to leave a man to starve to death, I have been to this hospital dozens of times and I no its not right to make a patient go without food."  " I am going to have a get my wife or my friend to come and bring me one of those diablo thick burgers from [employer] and some [employer] [place] chips."  Writer informed patient not to eat anything until the doctor approved for safety reasons and  I would page Dr. Hartford Poli to let him know patient wants to eat.  Patient verbalizes understanding. Dr. Hartford Poli notified via text page.  Will continue to monitor patient.
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
## 2014-03-19 DIAGNOSIS — D62 Acute posthemorrhagic anemia: Secondary | ICD-10-CM

## 2014-03-19 DIAGNOSIS — I714 Abdominal aortic aneurysm, without rupture, unspecified: Secondary | ICD-10-CM

## 2014-03-19 DIAGNOSIS — K625 Hemorrhage of anus and rectum: Secondary | ICD-10-CM

## 2014-03-19 LAB — CBC
HCT: 29.3 % — ABNORMAL LOW (ref 39.0–52.0)
HEMATOCRIT: 32.3 % — AB (ref 39.0–52.0)
HEMOGLOBIN: 9.4 g/dL — AB (ref 13.0–17.0)
Hemoglobin: 8.4 g/dL — ABNORMAL LOW (ref 13.0–17.0)
MCH: 22.8 pg — ABNORMAL LOW (ref 26.0–34.0)
MCH: 23.2 pg — ABNORMAL LOW (ref 26.0–34.0)
MCHC: 28.7 g/dL — ABNORMAL LOW (ref 30.0–36.0)
MCHC: 29.1 g/dL — AB (ref 30.0–36.0)
MCV: 79.4 fL (ref 78.0–100.0)
MCV: 79.8 fL (ref 78.0–100.0)
PLATELETS: 230 10*3/uL (ref 150–400)
Platelets: 292 10*3/uL (ref 150–400)
RBC: 3.69 MIL/uL — ABNORMAL LOW (ref 4.22–5.81)
RBC: 4.05 MIL/uL — AB (ref 4.22–5.81)
RDW: 16.2 % — ABNORMAL HIGH (ref 11.5–15.5)
RDW: 16.5 % — ABNORMAL HIGH (ref 11.5–15.5)
WBC: 6.9 10*3/uL (ref 4.0–10.5)
WBC: 7.7 10*3/uL (ref 4.0–10.5)

## 2014-03-19 LAB — BASIC METABOLIC PANEL
BUN: 11 mg/dL (ref 6–23)
CHLORIDE: 102 meq/L (ref 96–112)
CO2: 25 mEq/L (ref 19–32)
Calcium: 8.5 mg/dL (ref 8.4–10.5)
Creatinine, Ser: 0.86 mg/dL (ref 0.50–1.35)
GFR calc Af Amer: >90 mL/min (ref >90)
GFR calc non Af Amer: 85 mL/min — ABNORMAL LOW (ref >90)
Glucose, Bld: 163 mg/dL — ABNORMAL HIGH (ref 70–99)
POTASSIUM: 4.3 meq/L (ref 3.7–5.3)
Sodium: 137 mEq/L (ref 137–147)

## 2014-03-19 LAB — GLUCOSE, CAPILLARY
GLUCOSE-CAPILLARY: 173 mg/dL — AB (ref 70–99)
Glucose-Capillary: 205 mg/dL — ABNORMAL HIGH (ref 70–99)
Glucose-Capillary: 161 mg/dL — ABNORMAL HIGH (ref 70–99)

## 2014-03-19 MED ORDER — PEG 3350-KCL-NA BICARB-NACL 420 G PO SOLR
4000.0000 mL | Freq: Once | ORAL | Status: AC
  Administered 2014-03-19: 4000 mL via ORAL

## 2014-03-19 MED ORDER — LORAZEPAM 0.5 MG PO TABS
0.5000 mg | ORAL_TABLET | Freq: Three times a day (TID) | ORAL | Status: DC | PRN
  Administered 2014-03-19 (×2): 0.5 mg via ORAL
  Filled 2014-03-19 (×2): qty 1

## 2014-03-19 NOTE — Progress Notes (Signed)
[facility] PROGRESS NOTE  [name] QMV:[id number] DOB: [birth year] DOA: 03/18/2014 PCP: Patricia Nettle, MD  Assessment/Plan: Melena -Discussed with Dr. Benson Norway: well known to him for recurrent episodes of GI Bleeding. -Advised to feed today, make NPO after midnight for EGD/colonoscopy. -No further BMs since admission. -Dr. Benson Norway to see in consultation. -Continue PPI.  Acute on Chronic Blood Loss Anemia -Hb was 9.1 last week at GI office. -Hb 8.2 on admission and only up to 8.4 after transfusion of 1 unit of PRBC. -No indication for further transfusion at present.  Chronic Hypoxic Respiratory Failure -3L of oxygen at baseline. -Stable. -2/2 COPD.  DM -Fair control. -Continue SSI.  Code Status: Full Code Family Communication: Patient only  Disposition Plan: Home when ready.   Consultants:  GI, Dr. Benson Norway   Antibiotics:  None   Subjective: No further BMs since admission. No complaints.  Objective: Filed Vitals:   03/18/14 2054 03/18/14 2129 03/19/14 0500 03/19/14 0850  BP:  134/81 129/70   Pulse:  73 77   Temp:  98.1 F (36.7 C) 98.9 F (37.2 C)   TempSrc:  Oral Oral   Resp:  20 20   Height:      Weight:   115.667 kg (255 lb)   SpO2: 97% 100% 100% 98%    Intake/Output Summary (Last 24 hours) at 03/19/14 1421 Last data filed at 03/19/14 1300  Gross per 24 hour  Intake 1495.83 ml  Output   2350 ml  Net -854.17 ml   Filed Weights   03/18/14 1547 03/19/14 0500  Weight: 117.2 kg (258 lb 6.1 oz) 115.667 kg (255 lb)    Exam:   General:  AA Ox3  Cardiovascular: RRR  Respiratory: CTA B  Abdomen: obese/S/NT/+BS  Extremities: trace bilateral edema   Neurologic:  Non-focal  Data Reviewed: Basic Metabolic Panel:  Recent Labs Lab 03/18/14 1225 03/19/14 0503  NA 135* 137  K 4.8 4.3  CL 98 102  CO2 24 25  GLUCOSE 315* 163*  BUN 17 11  CREATININE 0.96 0.86  CALCIUM 8.9 8.5   Liver Function Tests:  Recent Labs Lab  03/18/14 1225  AST 11  ALT 12  ALKPHOS 134*  BILITOT 0.3  PROT 6.8  ALBUMIN 3.3*   No results found for this basename: LIPASE, AMYLASE,  in the last 168 hours No results found for this basename: AMMONIA,  in the last 168 hours CBC:  Recent Labs Lab 03/18/14 1225 03/18/14 2301 03/19/14 0503 03/19/14 1255  WBC 7.9 7.4 6.9 7.7  HGB 8.2* 8.4* 8.4* 9.4*  HCT 28.9* 28.9* 29.3* 32.3*  MCV 79.0 79.0 79.4 79.8  PLT 316 238 230 292   Cardiac Enzymes: No results found for this basename: CKTOTAL, CKMB, CKMBINDEX, TROPONINI,  in the last 168 hours BNP (last 3 results)  Recent Labs  08/19/13 1531 09/07/13 2020 10/15/13 1534  PROBNP 596.9* 293.6* 487.1*   CBG:  Recent Labs Lab 03/18/14 1622 03/18/14 2127 03/19/14 0719  GLUCAP 166* 184* 173*    No results found for this or any previous visit (from the past 240 hour(s)).   Studies: No results found.  Scheduled Meds: . atorvastatin  40 mg Oral QHS  . budesonide-formoterol  2 puff Inhalation BID  . citalopram  20 mg Oral Daily  . folic acid  2 mg Oral q morning - 10a  . insulin aspart  0-9 Units Subcutaneous TID WC  . metoprolol tartrate  25 mg Oral BID  . pantoprazole (  PROTONIX) IV  40 mg Intravenous Q12H  . polyethylene glycol-electrolytes  4,000 mL Oral Once  . sodium chloride  3 mL Intravenous Q12H  . tiotropium  18 mcg Inhalation Daily   Continuous Infusions: . sodium chloride 75 mL/hr (03/19/14 1059)    Principal Problem:   Acute blood loss anemia Active Problems:   DM   Anemia   AAA (abdominal aortic aneurysm)   Chronic hypoxemic respiratory failure   Rectal bleeding    Time spent: 35 minutes. Greater than 50% of this time was spent in direct contact with the patient coordinating care.    [place] [facility] Pager [phone]  If 7PM-7AM, please contact night-coverage at [email or web address], password [facility] 03/19/2014, 2:21 PM  LOS: 1 day

## 2014-03-19 NOTE — Care Management Note (Addendum)
    Page 1 of 1   03/21/2014     1:55:24 PM CARE MANAGEMENT NOTE 03/21/2014  Patient:  [name], [name]   Account Number:  [id number]  Date Initiated:  03/19/2014  Documentation initiated by:  Dessa Phi  Subjective/Objective Assessment:   [place].     Action/Plan:   FROM HOME.HAS PCP,PHARMACY.   Anticipated DC Date:  03/24/2014   Anticipated DC Plan:  [place]  CM consult      Choice offered to / List presented to:             Status of service:  In process, will continue to follow Medicare Important Message given?   (If response is "NO", the following Medicare IM given date fields will be blank) Date Medicare IM given:   Date Additional Medicare IM given:    Discharge Disposition:    Per UR Regulation:  Reviewed for med. necessity/level of care/duration of stay  If discussed at [place] of Stay Meetings, dates discussed:    Comments:  03/21/14 Rakwon Letourneau RN,BSN NCM [phone] COLONOSCOPY-CECAL AVM'S.GI FOLLOWING.IVF@76 ,IV PROTONIX.D/C PLAN HOME.  03/19/14 Modest Draeger RN,BSN NCM [phone] NO ANTICIPATED D/C NEEDS.

## 2014-03-20 ENCOUNTER — Encounter (HOSPITAL_COMMUNITY)
Admission: EM | Disposition: A | Discharge status: Home / Self Care | Payer: Self-pay | Source: Home / Self Care | Attending: Internal Medicine

## 2014-03-20 ENCOUNTER — Encounter (HOSPITAL_COMMUNITY): Payer: Self-pay

## 2014-03-20 HISTORY — PX: COLONOSCOPY: SHX5424

## 2014-03-20 LAB — GLUCOSE, CAPILLARY
GLUCOSE-CAPILLARY: 157 mg/dL — AB (ref 70–99)
GLUCOSE-CAPILLARY: 159 mg/dL — AB (ref 70–99)
Glucose-Capillary: 135 mg/dL — ABNORMAL HIGH (ref 70–99)
Glucose-Capillary: 242 mg/dL — ABNORMAL HIGH (ref 70–99)

## 2014-03-20 LAB — BASIC METABOLIC PANEL
BUN: 9 mg/dL (ref 6–23)
CHLORIDE: 103 meq/L (ref 96–112)
CO2: 25 meq/L (ref 19–32)
Calcium: 8.4 mg/dL (ref 8.4–10.5)
Creatinine, Ser: 0.89 mg/dL (ref 0.50–1.35)
GFR calc Af Amer: >90 mL/min (ref >90)
GFR calc non Af Amer: 83 mL/min — ABNORMAL LOW (ref >90)
Glucose, Bld: 153 mg/dL — ABNORMAL HIGH (ref 70–99)
POTASSIUM: 4.3 meq/L (ref 3.7–5.3)
SODIUM: 139 meq/L (ref 137–147)

## 2014-03-20 LAB — CBC
HEMATOCRIT: 29.5 % — AB (ref 39.0–52.0)
Hemoglobin: 8.6 g/dL — ABNORMAL LOW (ref 13.0–17.0)
MCH: 23.3 pg — ABNORMAL LOW (ref 26.0–34.0)
MCHC: 29.2 g/dL — ABNORMAL LOW (ref 30.0–36.0)
MCV: 79.9 fL (ref 78.0–100.0)
Platelets: 215 10*3/uL (ref 150–400)
RBC: 3.69 MIL/uL — AB (ref 4.22–5.81)
RDW: 16.5 % — ABNORMAL HIGH (ref 11.5–15.5)
WBC: 7.2 10*3/uL (ref 4.0–10.5)

## 2014-03-20 SURGERY — COLONOSCOPY
Anesthesia: Moderate Sedation

## 2014-03-20 MED ORDER — MIDAZOLAM HCL 10 MG/2ML IJ SOLN
INTRAMUSCULAR | Status: AC
  Filled 2014-03-20: qty 2

## 2014-03-20 MED ORDER — SODIUM CHLORIDE 0.9 % IV SOLN: INTRAVENOUS | Status: DC

## 2014-03-20 MED ORDER — FENTANYL CITRATE 0.05 MG/ML IJ SOLN
INTRAMUSCULAR | Status: AC
  Filled 2014-03-20: qty 2

## 2014-03-20 MED ORDER — MIDAZOLAM HCL 5 MG/5ML IJ SOLN
INTRAMUSCULAR | Status: DC | PRN
  Administered 2014-03-20 (×2): 2 mg via INTRAVENOUS
  Administered 2014-03-20 (×2): 1 mg via INTRAVENOUS

## 2014-03-20 MED ORDER — FENTANYL CITRATE 0.05 MG/ML IJ SOLN
INTRAMUSCULAR | Status: DC | PRN
  Administered 2014-03-20 (×4): 25 ug via INTRAVENOUS

## 2014-03-20 NOTE — H&P (View-Only) (Signed)
[facility] PROGRESS NOTE  [name]:[id number] DOB: 1941-07-13 DOA: 03/18/2014 PCP: Patricia Nettle, MD  Assessment/Plan: Melena -Discussed with Dr. Benson Norway: well known to him for recurrent episodes of GI Bleeding. -Had multiple BMs overnight with bowel prep. -It appears plan is for colonoscopy today. -GI to see in consultation this am.  Acute on Chronic Blood Loss Anemia -Hb was 9.1 last week at GI office. -Hb 8.2 on admission and only up to 8.4 after transfusion of 1 unit of PRBC. -No indication for further transfusion at present. -Hb stable at 8.6 6/4.  Chronic Hypoxic Respiratory Failure -3L of oxygen at baseline. -Stable. -2/2 COPD.  DM -Fair control. -Continue SSI.  Code Status: Full Code Family Communication: Patient only  Disposition Plan: Home when ready.   Consultants:  GI, Dr. Benson Norway   Antibiotics:  None   Subjective: Grumpy this am. States we are not taking "his nourishment" into consideration. No blood noted in multiple BMs.  Objective: Filed Vitals:   03/19/14 2034 03/19/14 2041 03/20/14 0549 03/20/14 0803  BP: 122/66  140/78   Pulse: 70  85   Temp: 97.6 F (36.4 C)  97.6 F (36.4 C)   TempSrc: Oral  Oral   Resp: 20  20   Height:      Weight:      SpO2: 100% 98% 97% 98%    Intake/Output Summary (Last 24 hours) at 03/20/14 0955 Last data filed at 03/20/14 9562  Gross per 24 hour  Intake   1760 ml  Output   2750 ml  Net   -990 ml   Filed Weights   03/18/14 1547 03/19/14 0500  Weight: 117.2 kg (258 lb 6.1 oz) 115.667 kg (255 lb)    Exam:   General:  AA Ox3  Cardiovascular: RRR  Respiratory: CTA B  Abdomen: obese/S/NT/+BS  Extremities: trace bilateral edema   Neurologic:  Non-focal  Data Reviewed: Basic Metabolic Panel:  Recent Labs Lab 03/18/14 1225 03/19/14 0503 03/20/14 0455  NA 135* 137 139  K 4.8 4.3 4.3  CL 98 102 103  CO2 24 25 25   GLUCOSE 315* 163* 153*  BUN 17 11 9   CREATININE 0.96  0.86 0.89  CALCIUM 8.9 8.5 8.4   Liver Function Tests:  Recent Labs Lab 03/18/14 1225  AST 11  ALT 12  ALKPHOS 134*  BILITOT 0.3  PROT 6.8  ALBUMIN 3.3*   No results found for this basename: LIPASE, AMYLASE,  in the last 168 hours No results found for this basename: AMMONIA,  in the last 168 hours CBC:  Recent Labs Lab 03/18/14 1225 03/18/14 2301 03/19/14 0503 03/19/14 1255 03/20/14 0455  WBC 7.9 7.4 6.9 7.7 7.2  HGB 8.2* 8.4* 8.4* 9.4* 8.6*  HCT 28.9* 28.9* 29.3* 32.3* 29.5*  MCV 79.0 79.0 79.4 79.8 79.9  PLT 316 238 230 292 215   Cardiac Enzymes: No results found for this basename: CKTOTAL, CKMB, CKMBINDEX, TROPONINI,  in the last 168 hours BNP (last 3 results)  Recent Labs  08/19/13 1531 09/07/13 2020 10/15/13 1534  PROBNP 596.9* 293.6* 487.1*   CBG:  Recent Labs Lab 03/18/14 2127 03/19/14 0719 03/19/14 1657 03/19/14 2014 03/20/14 0741  GLUCAP 184* 173* 205* 161* 157*    No results found for this or any previous visit (from the past 240 hour(s)).   Studies: No results found.  Scheduled Meds: . atorvastatin  40 mg Oral QHS  . budesonide-formoterol  2 puff Inhalation BID  . citalopram  20 mg Oral Daily  . folic acid  2 mg Oral q morning - 10a  . insulin aspart  0-9 Units Subcutaneous TID WC  . metoprolol tartrate  25 mg Oral BID  . pantoprazole (PROTONIX) IV  40 mg Intravenous Q12H  . sodium chloride  3 mL Intravenous Q12H  . tiotropium  18 mcg Inhalation Daily   Continuous Infusions: . sodium chloride 75 mL/hr at 03/19/14 2344    Principal Problem:   Acute blood loss anemia Active Problems:   DM   Anemia   AAA (abdominal aortic aneurysm)   Chronic hypoxemic respiratory failure   Rectal bleeding    Time spent: 25 minutes. Greater than 50% of this time was spent in direct contact with the patient coordinating care.    [place] [facility] Pager [phone]  If 7PM-7AM, please contact night-coverage at  [email or web address], password [facility] 03/20/2014, 9:55 AM  LOS: 2 days

## 2014-03-20 NOTE — Interval H&P Note (Signed)
History and Physical Interval Note:  03/20/2014 3:51 PM  [name]  has presented today for surgery, with the diagnosis of Melena and history of cecal AVMs  The various methods of treatment have been discussed with the patient and family. After consideration of risks, benefits and other options for treatment, the patient has consented to  Procedure(s): COLONOSCOPY (N/A) as a surgical intervention .  The patient's history has been reviewed, patient examined, no change in status, stable for surgery.  I have reviewed the patient's chart and labs.  Questions were answered to the patient's satisfaction.     [name]

## 2014-03-20 NOTE — Progress Notes (Signed)
[facility] PROGRESS NOTE  [name] UXN:[id number] DOB: [birth year] DOA: 03/18/2014 PCP: Patricia Nettle, MD  Assessment/Plan: Melena -Discussed with Dr. Benson Norway: well known to him for recurrent episodes of GI Bleeding. -Had multiple BMs overnight with bowel prep. -It appears plan is for colonoscopy today. -GI to see in consultation this am.  Acute on Chronic Blood Loss Anemia -Hb was 9.1 last week at GI office. -Hb 8.2 on admission and only up to 8.4 after transfusion of 1 unit of PRBC. -No indication for further transfusion at present. -Hb stable at 8.6 6/4.  Chronic Hypoxic Respiratory Failure -3L of oxygen at baseline. -Stable. -2/2 COPD.  DM -Fair control. -Continue SSI.  Code Status: Full Code Family Communication: Patient only  Disposition Plan: Home when ready.   Consultants:  GI, Dr. Benson Norway   Antibiotics:  None   Subjective: Grumpy this am. States we are not taking "his nourishment" into consideration. No blood noted in multiple BMs.  Objective: Filed Vitals:   03/19/14 2034 03/19/14 2041 03/20/14 0549 03/20/14 0803  BP: 122/66  140/78   Pulse: 70  85   Temp: 97.6 F (36.4 C)  97.6 F (36.4 C)   TempSrc: Oral  Oral   Resp: 20  20   Height:      Weight:      SpO2: 100% 98% 97% 98%    Intake/Output Summary (Last 24 hours) at 03/20/14 0955 Last data filed at 03/20/14 2542  Gross per 24 hour  Intake   1760 ml  Output   2750 ml  Net   -990 ml   Filed Weights   03/18/14 1547 03/19/14 0500  Weight: 117.2 kg (258 lb 6.1 oz) 115.667 kg (255 lb)    Exam:   General:  AA Ox3  Cardiovascular: RRR  Respiratory: CTA B  Abdomen: obese/S/NT/+BS  Extremities: trace bilateral edema   Neurologic:  Non-focal  Data Reviewed: Basic Metabolic Panel:  Recent Labs Lab 03/18/14 1225 03/19/14 0503 03/20/14 0455  NA 135* 137 139  K 4.8 4.3 4.3  CL 98 102 103  CO2 24 25 25   GLUCOSE 315* 163* 153*  BUN 17 11 9   CREATININE 0.96  0.86 0.89  CALCIUM 8.9 8.5 8.4   Liver Function Tests:  Recent Labs Lab 03/18/14 1225  AST 11  ALT 12  ALKPHOS 134*  BILITOT 0.3  PROT 6.8  ALBUMIN 3.3*   No results found for this basename: LIPASE, AMYLASE,  in the last 168 hours No results found for this basename: AMMONIA,  in the last 168 hours CBC:  Recent Labs Lab 03/18/14 1225 03/18/14 2301 03/19/14 0503 03/19/14 1255 03/20/14 0455  WBC 7.9 7.4 6.9 7.7 7.2  HGB 8.2* 8.4* 8.4* 9.4* 8.6*  HCT 28.9* 28.9* 29.3* 32.3* 29.5*  MCV 79.0 79.0 79.4 79.8 79.9  PLT 316 238 230 292 215   Cardiac Enzymes: No results found for this basename: CKTOTAL, CKMB, CKMBINDEX, TROPONINI,  in the last 168 hours BNP (last 3 results)  Recent Labs  08/19/13 1531 09/07/13 2020 10/15/13 1534  PROBNP 596.9* 293.6* 487.1*   CBG:  Recent Labs Lab 03/18/14 2127 03/19/14 0719 03/19/14 1657 03/19/14 2014 03/20/14 0741  GLUCAP 184* 173* 205* 161* 157*    No results found for this or any previous visit (from the past 240 hour(s)).   Studies: No results found.  Scheduled Meds: . atorvastatin  40 mg Oral QHS  . budesonide-formoterol  2 puff Inhalation BID  . citalopram  20 mg Oral Daily  . folic acid  2 mg Oral q morning - 10a  . insulin aspart  0-9 Units Subcutaneous TID WC  . metoprolol tartrate  25 mg Oral BID  . pantoprazole (PROTONIX) IV  40 mg Intravenous Q12H  . sodium chloride  3 mL Intravenous Q12H  . tiotropium  18 mcg Inhalation Daily   Continuous Infusions: . sodium chloride 75 mL/hr at 03/19/14 2344    Principal Problem:   Acute blood loss anemia Active Problems:   DM   Anemia   AAA (abdominal aortic aneurysm)   Chronic hypoxemic respiratory failure   Rectal bleeding    Time spent: 25 minutes. Greater than 50% of this time was spent in direct contact with the patient coordinating care.    [facility] Pager [phone]  If 7PM-7AM, please contact night-coverage at  [email or web address], password [facility] 03/20/2014, 9:55 AM  LOS: 2 days

## 2014-03-20 NOTE — Op Note (Signed)
[facility] [place], [id number]   OPERATIVE PROCEDURE REPORT  PATIENT: [name], [name]  MR#: [id number] BIRTHDATE: [birth year]  GENDER: Male ENDOSCOPIST: Carol Ada, MD ASSISTANT:   William Dalton, technician Alcide Clever, RN, CGRN PROCEDURE DATE: 03/20/2014 PROCEDURE:   Colonoscopy with APC ASA CLASS:   Class III INDICATIONS:Melena MEDICATIONS: Versed 6 mg IV and Fentanyl 100 mcg IV  DESCRIPTION OF PROCEDURE:   After the risks benefits and alternatives of the procedure were thoroughly explained, informed consent was obtained.  A digital rectal exam revealed no abnormalities of the rectum.    The Pentax Ped Colon [id number] endoscope was introduced through the anus  and advanced to the cecum, which was identified by both the appendix and ileocecal valve , No adverse events experienced.    The quality of the prep was adequate. .  The instrument was then slowly withdrawn as the colon was fully examined.     FINDINGS: The prep in the cecum was significant for a lot of retained vegetable contents.  The area was able to be washed and a couple of small nonbleeding AVMs were noted.  The mucosa in the cecum was also friable, but no overt inflammation was found. Treamtent with APC at those atypical sites precipitated bleeding, but the bleeding stopped with further treatment.  At one site the patient experienced some pain and this warea was prophylactically treated with an hemoclip.  A total of four lesions were ablated. The scope was then withdrawn from the patient and the procedure terminated.  COMPLICATIONS: There were no complications.  IMPRESSION: 1) Cecal AVMs s/p APC.  RECOMMENDATIONS: 1) Follow HGB. 2) Transfuse as necessary.  _______________________________ eSignedCarol Ada, MD 03/20/2014 4:32 PM

## 2014-03-20 NOTE — Progress Notes (Signed)
Received report from [place], [place]. I agree with assessment. Will continue to monitor closely.

## 2014-03-21 ENCOUNTER — Encounter (HOSPITAL_COMMUNITY): Payer: Self-pay | Admitting: Gastroenterology

## 2014-03-21 LAB — CBC
HCT: 31 % — ABNORMAL LOW (ref 39.0–52.0)
HEMATOCRIT: 27.4 % — AB (ref 39.0–52.0)
HEMATOCRIT: 33 % — AB (ref 39.0–52.0)
HEMOGLOBIN: 9.9 g/dL — AB (ref 13.0–17.0)
Hemoglobin: 7.8 g/dL — ABNORMAL LOW (ref 13.0–17.0)
Hemoglobin: 9 g/dL — ABNORMAL LOW (ref 13.0–17.0)
MCH: 23.1 pg — AB (ref 26.0–34.0)
MCH: 24.2 pg — ABNORMAL LOW (ref 26.0–34.0)
MCH: 23.4 pg — ABNORMAL LOW (ref 26.0–34.0)
MCHC: 28.5 g/dL — AB (ref 30.0–36.0)
MCHC: 30 g/dL (ref 30.0–36.0)
MCHC: 29 g/dL — AB (ref 30.0–36.0)
MCV: 81.3 fL (ref 78.0–100.0)
MCV: 80.7 fL (ref 78.0–100.0)
MCV: 80.5 fL (ref 78.0–100.0)
Platelets: 206 10*3/uL (ref 150–400)
Platelets: 214 10*3/uL (ref 150–400)
RBC: 3.37 MIL/uL — ABNORMAL LOW (ref 4.22–5.81)
RBC: 4.09 MIL/uL — ABNORMAL LOW (ref 4.22–5.81)
RBC: 3.85 MIL/uL — ABNORMAL LOW (ref 4.22–5.81)
RDW: 16.9 % — AB (ref 11.5–15.5)
RDW: 16.4 % — AB (ref 11.5–15.5)
RDW: 16.5 % — ABNORMAL HIGH (ref 11.5–15.5)
WBC: 6.2 10*3/uL (ref 4.0–10.5)
WBC: 6 10*3/uL (ref 4.0–10.5)
WBC: 7.6 10*3/uL (ref 4.0–10.5)

## 2014-03-21 LAB — GLUCOSE, CAPILLARY
GLUCOSE-CAPILLARY: 194 mg/dL — AB (ref 70–99)
GLUCOSE-CAPILLARY: 195 mg/dL — AB (ref 70–99)
Glucose-Capillary: 187 mg/dL — ABNORMAL HIGH (ref 70–99)

## 2014-03-21 LAB — PREPARE RBC (CROSSMATCH)

## 2014-03-21 NOTE — Progress Notes (Signed)
Subjective: Feeling well.  No acute events.  Objective: Vital signs in last 24 hours: Temp:  [97.6 F (36.4 C)-98.2 F (36.8 C)] 98.1 F (36.7 C) (06/05 1457) Pulse Rate:  [67-95] 69 (06/05 1457) Resp:  [15-20] 18 (06/05 1457) BP: (130-148)/(60-83) 138/60 mmHg (06/05 1457) SpO2:  [93 %-100 %] 97 % (06/05 1350) Last BM Date: 03/20/14  Intake/Output from previous day: 06/04 0701 - 06/05 0700 In: 1567.5 [P.O.:600; I.V.:967.5] Out: 1050 [Urine:1050] Intake/Output this shift: Total I/O In: 1185.8 [P.O.:840; Blood:345.8] Out: 1275 [Urine:1275]  General appearance: alert and no distress GI: soft, non-tender; bowel sounds normal; no masses,  no organomegaly  Lab Results:  Recent Labs  03/19/14 1255 03/20/14 0455 03/21/14 0402  WBC 7.7 7.2 6.2  HGB 9.4* 8.6* 7.8*  HCT 32.3* 29.5* 27.4*  PLT 292 215 206   BMET  Recent Labs  03/19/14 0503 03/20/14 0455  NA 137 139  K 4.3 4.3  CL 102 103  CO2 25 25  GLUCOSE 163* 153*  BUN 11 9  CREATININE 0.86 0.89  CALCIUM 8.5 8.4   LFT No results found for this basename: PROT, ALBUMIN, AST, ALT, ALKPHOS, BILITOT, BILIDIR, IBILI,  in the last 72 hours PT/INR No results found for this basename: LABPROT, INR,  in the last 72 hours Hepatitis Panel No results found for this basename: HEPBSAG, HCVAB, HEPAIGM, HEPBIGM,  in the last 72 hours C-Diff No results found for this basename: CDIFFTOX,  in the last 72 hours Fecal Lactopherrin No results found for this basename: FECLLACTOFRN,  in the last 72 hours  Studies/Results: No results found.  Medications:  Scheduled: . atorvastatin  40 mg Oral QHS  . budesonide-formoterol  2 puff Inhalation BID  . citalopram  20 mg Oral Daily  . folic acid  2 mg Oral q morning - 10a  . insulin aspart  0-9 Units Subcutaneous TID WC  . metoprolol tartrate  25 mg Oral BID  . pantoprazole (PROTONIX) IV  40 mg Intravenous Q12H  . sodium chloride  3 mL Intravenous Q12H  . tiotropium  18 mcg  Inhalation Daily   Continuous: . sodium chloride 75 mL/hr at 03/21/14 0953    Assessment/Plan: 1) GI bleed.  ? Cecal AVM source.   He is stable.  His HGB dropped down, but it is most likely from his initial bleed rather than an ongoing bleed.  Plan: 1) Agree with the blood transfusion. 2) Follow HGB. 3) If stable he can be discharged home.  I will have him follow up with a CBC in 2 weeks.  LOS: 3 days   [name] 03/21/2014, 5:10 PM

## 2014-03-21 NOTE — Progress Notes (Signed)
[facility] PROGRESS NOTE  [name] LSL:[id number] DOB: 07/25/1941 DOA: 03/18/2014 PCP: Patricia Nettle, MD  Assessment/Plan: Melena -Discussed with Dr. Benson Norway: well known to him for recurrent episodes of GI Bleeding. -Colonoscopy performed yesterday with some bleeding AVMs. -Continue PPI.  Acute on Chronic Blood Loss Anemia -Hb was 9.1 last week at GI office. -Hb 8.2 on admission and only up to 8.4 after transfusion of 1 unit of PRBC. -Hb dropped to 7.8 today (6/5). Will receive 1 units of PRBCs.  Chronic Hypoxic Respiratory Failure -3L of oxygen at baseline. -Stable. -2/2 COPD.  DM -Fair control. -Continue SSI.  Code Status: Full Code Family Communication: Patient only  Disposition Plan: Home when ready; likely in 24 hours.   Consultants:  GI, Dr. Benson Norway   Antibiotics:  None   Subjective: No acute events.  Objective: Filed Vitals:   03/21/14 1150 03/21/14 1250 03/21/14 1350 03/21/14 1457  BP: 141/82 134/83 130/62 138/60  Pulse: 70 68 78 69  Temp: 97.8 F (36.6 C) 98.2 F (36.8 C) 98 F (36.7 C) 98.1 F (36.7 C)  TempSrc: Oral Oral Oral Oral  Resp: 18 15 18 18   Height:      Weight:      SpO2:        Intake/Output Summary (Last 24 hours) at 03/21/14 1508 Last data filed at 03/21/14 1430  Gross per 24 hour  Intake 2753.33 ml  Output   1600 ml  Net 1153.33 ml   Filed Weights   03/18/14 1547 03/19/14 0500  Weight: 117.2 kg (258 lb 6.1 oz) 115.667 kg (255 lb)    Exam:   General:  AA Ox3  Cardiovascular: RRR  Respiratory: CTA B  Abdomen: obese/S/NT/+BS  Extremities: trace bilateral edema   Neurologic:  Non-focal  Data Reviewed: Basic Metabolic Panel:  Recent Labs Lab 03/18/14 1225 03/19/14 0503 03/20/14 0455  NA 135* 137 139  K 4.8 4.3 4.3  CL 98 102 103  CO2 24 25 25   GLUCOSE 315* 163* 153*  BUN 17 11 9   CREATININE 0.96 0.86 0.89  CALCIUM 8.9 8.5 8.4   Liver Function Tests:  Recent Labs Lab  03/18/14 1225  AST 11  ALT 12  ALKPHOS 134*  BILITOT 0.3  PROT 6.8  ALBUMIN 3.3*   No results found for this basename: LIPASE, AMYLASE,  in the last 168 hours No results found for this basename: AMMONIA,  in the last 168 hours CBC:  Recent Labs Lab 03/18/14 2301 03/19/14 0503 03/19/14 1255 03/20/14 0455 03/21/14 0402  WBC 7.4 6.9 7.7 7.2 6.2  HGB 8.4* 8.4* 9.4* 8.6* 7.8*  HCT 28.9* 29.3* 32.3* 29.5* 27.4*  MCV 79.0 79.4 79.8 79.9 81.3  PLT 238 230 292 215 206   Cardiac Enzymes: No results found for this basename: CKTOTAL, CKMB, CKMBINDEX, TROPONINI,  in the last 168 hours BNP (last 3 results)  Recent Labs  08/19/13 1531 09/07/13 2020 10/15/13 1534  PROBNP 596.9* 293.6* 487.1*   CBG:  Recent Labs Lab 03/20/14 1143 03/20/14 1657 03/20/14 2038 03/21/14 0729 03/21/14 1145  GLUCAP 159* 135* 242* 187* 194*    No results found for this or any previous visit (from the past 240 hour(s)).   Studies: No results found.  Scheduled Meds: . atorvastatin  40 mg Oral QHS  . budesonide-formoterol  2 puff Inhalation BID  . citalopram  20 mg Oral Daily  . folic acid  2 mg Oral q morning - 10a  . insulin aspart  0-9 Units Subcutaneous TID WC  . metoprolol tartrate  25 mg Oral BID  . pantoprazole (PROTONIX) IV  40 mg Intravenous Q12H  . sodium chloride  3 mL Intravenous Q12H  . tiotropium  18 mcg Inhalation Daily   Continuous Infusions: . sodium chloride 75 mL/hr at 03/21/14 1100    Principal Problem:   Acute blood loss anemia Active Problems:   DM   Anemia   AAA (abdominal aortic aneurysm)   Chronic hypoxemic respiratory failure   Rectal bleeding    Time spent: 35 minutes. Greater than 50% of this time was spent in direct contact with the patient coordinating care.    [facility] Pager [phone]  If 7PM-7AM, please contact night-coverage at [email or web address], password [email or web address] 03/21/2014, 3:08 PM  LOS: 3 days

## 2014-03-22 DIAGNOSIS — K31811 Angiodysplasia of stomach and duodenum with bleeding: Secondary | ICD-10-CM

## 2014-03-22 DIAGNOSIS — J449 Chronic obstructive pulmonary disease, unspecified: Secondary | ICD-10-CM

## 2014-03-22 DIAGNOSIS — Q2733 Arteriovenous malformation of digestive system vessel: Secondary | ICD-10-CM

## 2014-03-22 DIAGNOSIS — J9610 Chronic respiratory failure, unspecified whether with hypoxia or hypercapnia: Secondary | ICD-10-CM

## 2014-03-22 DIAGNOSIS — R0902 Hypoxemia: Secondary | ICD-10-CM

## 2014-03-22 LAB — CBC
HCT: 31.4 % — ABNORMAL LOW (ref 39.0–52.0)
HEMOGLOBIN: 9.4 g/dL — AB (ref 13.0–17.0)
MCH: 24.1 pg — AB (ref 26.0–34.0)
MCHC: 29.9 g/dL — ABNORMAL LOW (ref 30.0–36.0)
MCV: 80.5 fL (ref 78.0–100.0)
Platelets: 248 10*3/uL (ref 150–400)
RBC: 3.9 MIL/uL — ABNORMAL LOW (ref 4.22–5.81)
RDW: 16.6 % — ABNORMAL HIGH (ref 11.5–15.5)
WBC: 9.2 10*3/uL (ref 4.0–10.5)

## 2014-03-22 LAB — GLUCOSE, CAPILLARY
Glucose-Capillary: 213 mg/dL — ABNORMAL HIGH (ref 70–99)
Glucose-Capillary: 194 mg/dL — ABNORMAL HIGH (ref 70–99)

## 2014-03-22 NOTE — Discharge Summary (Signed)
Physician Discharge Summary  [name] OAC:[id number] DOB: Nov 14, 1940 DOA: 03/18/2014  PCP: Patricia Nettle, MD  Admit date: 03/18/2014 Discharge date: 03/22/2014  Time spent: 45 minutes  Recommendations for Outpatient Follow-up:  -Will be discharged home today. -Will follow up with Dr. Benson Norway in 2 weeks for Hb recheck.   Discharge Diagnoses:  Principal Problem:   Acute blood loss anemia Active Problems:   DM   Anemia   AAA (abdominal aortic aneurysm)   Chronic hypoxemic respiratory failure   Rectal bleeding   Discharge Condition: Stable and improved  Filed Weights   03/18/14 1547 03/19/14 0500  Weight: 117.2 kg (258 lb 6.1 oz) 115.667 kg (255 lb)    History of present illness:  [name] is a 73 y.o. male with past medical history of recurrent GI bleed with multiple EGD/colonoscopies, chronic anemia, history of CABG, diabetes and hypertension. Patient brought to the hospital from his PCP office because of hematochezia. Patient was seen the day before yesterday by his GI specialist Dr. Benson Norway, no problem at that time, since yesterday his bowel movements started to turn black, he had to tolerate black bowel movement today, the second one was in Dr. Tonita Phoenix office, patient brought to the hospital by ambulance after the big black bowel movement.  In the ED his hemoglobin is 8.2, his lactic acid is 4.0, he denies any nausea, vomiting or abdominal pain. Patient will be admitted to the hospital for further evaluation.   Hospital Course:   Melena  -Discussed with Dr. Benson Norway: well known to him for recurrent episodes of GI Bleeding.  -Colonoscopy performed yesterday with some cecal  AVMs.  -Continue PPI.   Acute on Chronic Blood Loss Anemia  -Hb was 9.1 last week at GI office.  -Hb 8.2 on admission and only up to 8.4 after transfusion of 1 unit of PRBC.  -Hb dropped to 7.8 on 6/5 (probably related to initial bleed as he had no signs of re bleed) and up to 9.4 on DC  (6/6) after 1 unit of PRBCs. -Discussed with Dr. Benson Norway via phone. We agree he is stable to DC home today. -May very well have recurrent bleed from his AVMs in the future, but has exhibited no signs of re-bleeding this hospitalization.  Chronic Hypoxic Respiratory Failure  -3L of oxygen at baseline.  -Stable.  -2/2 COPD.   DM  -Fair control.  -Continue metformin on DC.   Procedures: Colonoscopy 6/4: IMPRESSION:  1) Cecal AVMs s/p APC.  RECOMMENDATIONS:  1) Follow HGB.  2) Transfuse as necessary.   Consultations:  GI, Dr. Benson Norway  Discharge Instructions  Discharge Instructions   Discontinue IV    Complete by:  As directed      Increase activity slowly    Complete by:  As directed             Medication List    STOP taking these medications       aspirin 325 MG tablet      TAKE these medications       albuterol 108 (90 BASE) MCG/ACT inhaler  Commonly known as:  PROVENTIL HFA;VENTOLIN HFA  Inhale 2 puffs into the lungs every 6 (six) hours as needed for wheezing.     ALPRAZolam 0.5 MG tablet  Commonly known as:  XANAX  Take 1 tablet (0.5 mg total) by mouth 3 (three) times daily as needed for anxiety.     atorvastatin 40 MG tablet  Commonly known as:  LIPITOR  Take 1 tablet (40 mg total) by mouth at bedtime.     budesonide-formoterol 160-4.5 MCG/ACT inhaler  Commonly known as:  SYMBICORT  Inhale 2 puffs into the lungs 2 (two) times daily.     citalopram 20 MG tablet  Commonly known as:  CELEXA  Take 1 tablet (20 mg total) by mouth daily.     clobetasol ointment 0.05 %  Commonly known as:  TEMOVATE  Apply 1 application topically 2 (two) times daily as needed (rash).     folic acid 1 MG tablet  Commonly known as:  FOLVITE  Take 2 tablets (2 mg total) by mouth every morning.     HYDROcodone-acetaminophen 5-325 MG per tablet  Commonly known as:  NORCO/VICODIN  Take 1 tablet by mouth every 6 (six) hours as needed for moderate pain.     iron  polysaccharides 150 MG capsule  Commonly known as:  NIFEREX  Take 150 mg by mouth 2 (two) times daily.     metFORMIN 500 MG tablet  Commonly known as:  GLUCOPHAGE  Take 500 mg by mouth 2 (two) times daily with a meal.     metoprolol tartrate 25 MG tablet  Commonly known as:  LOPRESSOR  Take 1 tablet (25 mg total) by mouth 2 (two) times daily.     pantoprazole 40 MG tablet  Commonly known as:  PROTONIX  Take 1 tablet (40 mg total) by mouth 2 (two) times daily. Must Keep Appointment w/Dr. Gwenlyn Found     tiotropium 18 MCG inhalation capsule  Commonly known as:  SPIRIVA  Place 18 mcg into inhaler and inhale daily.     zolpidem 10 MG tablet  Commonly known as:  AMBIEN  TAKE 1 TABLET BY MOUTH AT BEDTIME       Allergies  Allergen Reactions  . Penicillins Anaphylaxis and Hives  . Demerol [Meperidine] Other (See Comments)    hallucinations  . Dilaudid [Hydromorphone Hcl] Other (See Comments)    hallucinations  . Levaquin [Levofloxacin In D5w]     Nausea and diarrhea  . Morphine And Related Nausea Only and Other (See Comments)    hallucinations       Follow-up Information   Follow up with HUNG,PATRICK D, MD. Schedule an appointment as soon as possible for a visit in 2 weeks. (To have your blood levels rechecked)    Specialty:  Gastroenterology   Contact information:   [address] [place]  [address] [phone]        The results of significant diagnostics from this hospitalization (including imaging, microbiology, ancillary and laboratory) are listed below for reference.    Significant Diagnostic Studies: No results found.  Microbiology: No results found for this or any previous visit (from the past 240 hour(s)).   Labs: Basic Metabolic Panel:  Recent Labs Lab 03/18/14 1225 03/19/14 0503 03/20/14 0455  NA 135* 137 139  K 4.8 4.3 4.3  CL 98 102 103  CO2 24 25 25   GLUCOSE 315* 163* 153*  BUN 17 11 9   CREATININE 0.96 0.86 0.89  CALCIUM 8.9  8.5 8.4   Liver Function Tests:  Recent Labs Lab 03/18/14 1225  AST 11  ALT 12  ALKPHOS 134*  BILITOT 0.3  PROT 6.8  ALBUMIN 3.3*   No results found for this basename: LIPASE, AMYLASE,  in the last 168 hours No results found for this basename: AMMONIA,  in the last 168 hours CBC:  Recent Labs Lab 03/20/14 0455 03/21/14 0402 03/21/14 1715 03/21/14  2015 03/22/14 0530  WBC 7.2 6.2 6.0 7.6 9.2  HGB 8.6* 7.8* 9.9* 9.0* 9.4*  HCT 29.5* 27.4* 33.0* 31.0* 31.4*  MCV 79.9 81.3 80.7 80.5 80.5  PLT 215 206 SPECIMEN CLOTTED 214 248   Cardiac Enzymes: No results found for this basename: CKTOTAL, CKMB, CKMBINDEX, TROPONINI,  in the last 168 hours BNP: BNP (last 3 results)  Recent Labs  08/19/13 1531 09/07/13 2020 10/15/13 1534  PROBNP 596.9* 293.6* 487.1*   CBG:  Recent Labs Lab 03/21/14 0729 03/21/14 1145 03/21/14 1716 03/21/14 2133 03/22/14 0740  GLUCAP 187* 194* 195* 213* 194*       Signed:  Erline Hau  [facility] Pager: [phone] 03/22/2014, 9:10 AM

## 2014-03-22 NOTE — Progress Notes (Signed)
Pt d/c home with his wife. D/c instructions reviewed with pt. Pt verbalized understanding

## 2014-03-23 LAB — TYPE AND SCREEN
ABO/RH(D): B NEG
Antibody Screen: NEGATIVE
Unit division: 0
Unit division: 0
Unit division: 0

## 2014-03-23 IMAGING — CR DG CHEST 2V
2 series · 2 of 2 positions shown · non-contrast
Comparison: 05/14/2013.

CLINICAL DATA: Chest pain.

EXAM:
CHEST  2 VIEW

[w chest lat]
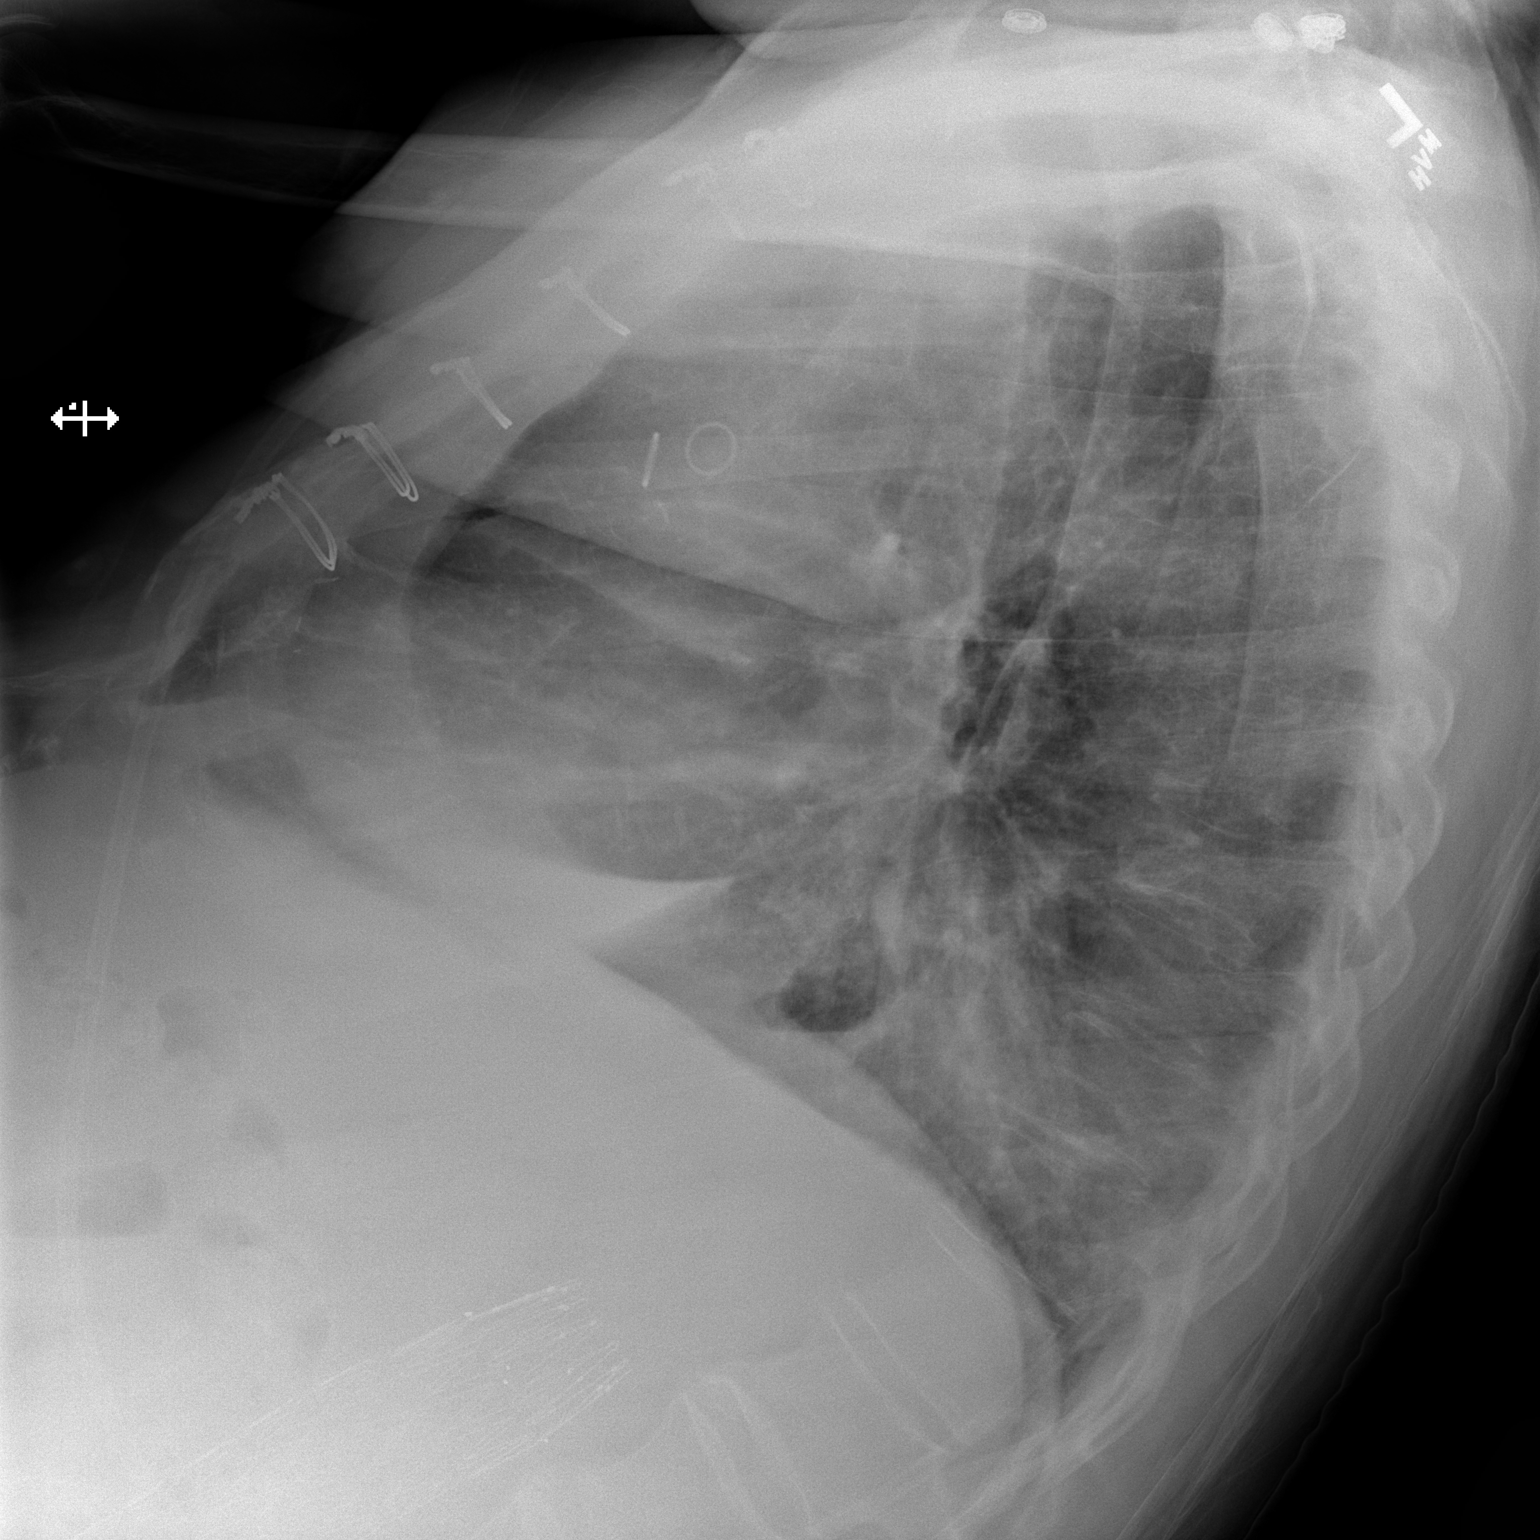

[x chest ap]
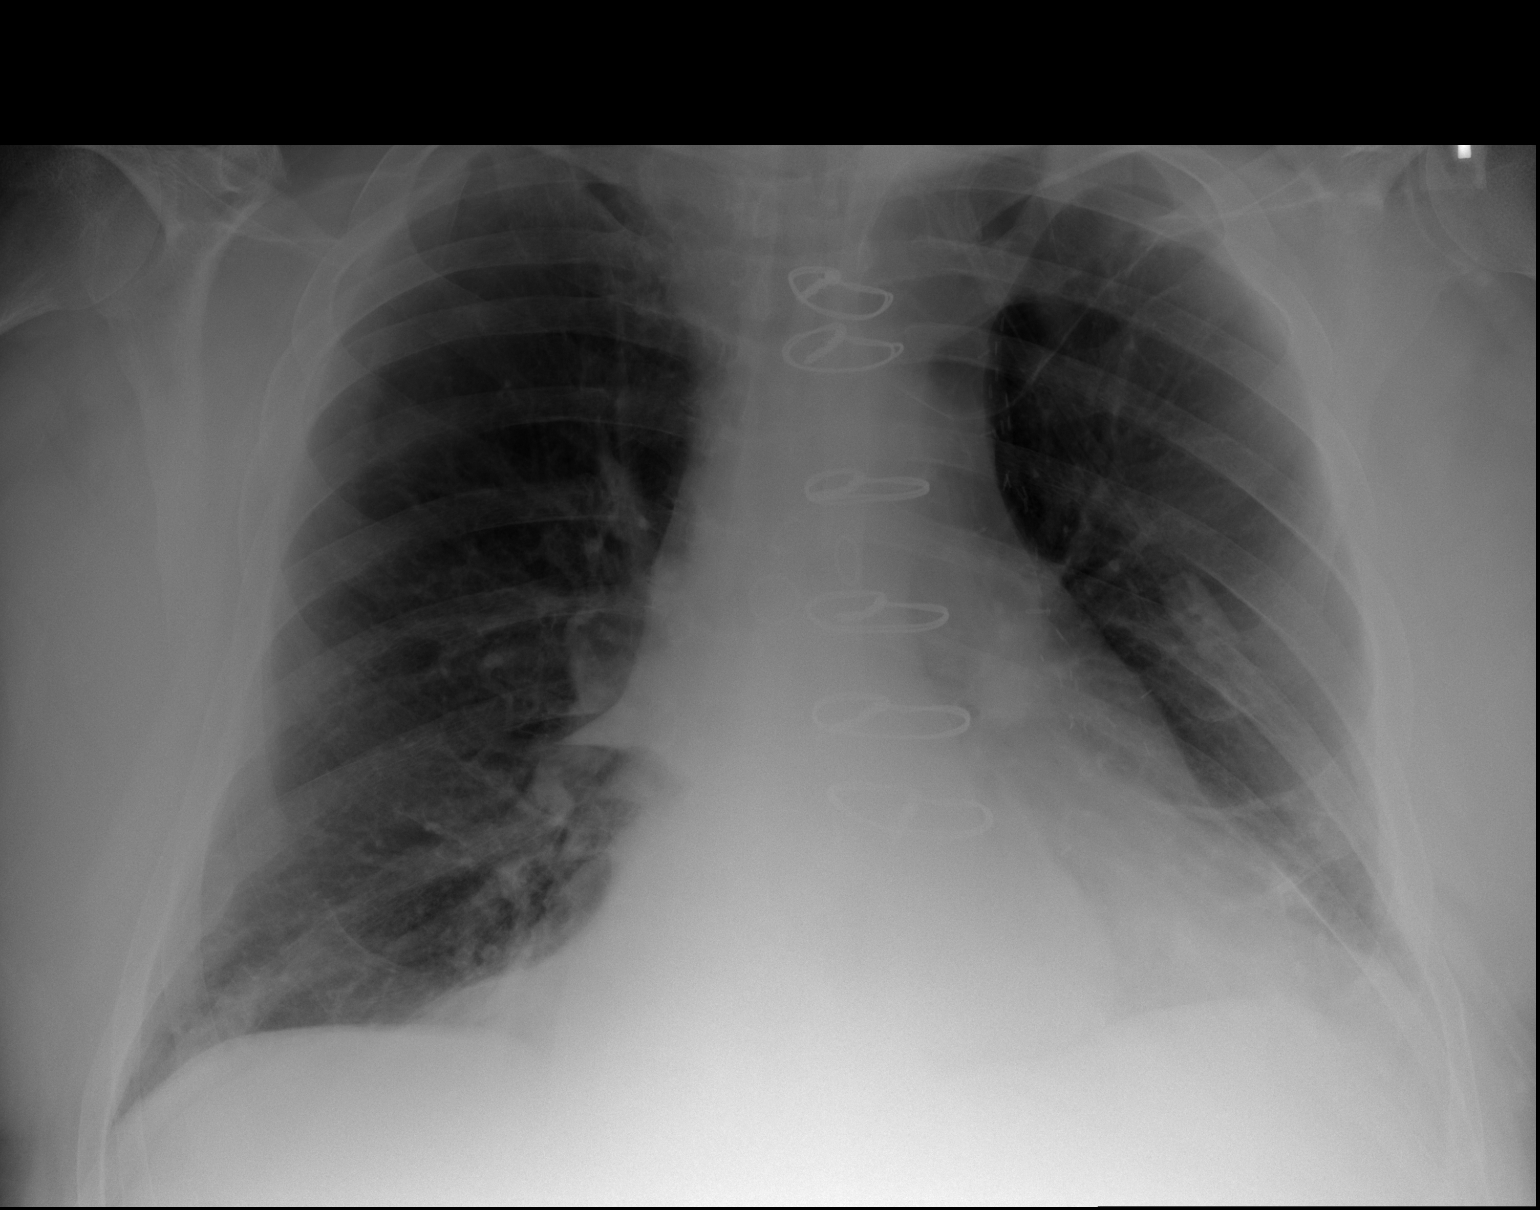

[2 of 2 positions shown; findings below may reference images not displayed]

FINDINGS: Of the heart is upper limits of normal and stable. The mediastinal
and hilar contours are unchanged. Stable surgical changes from
bypass surgery. Stable basilar scarring changes and probable
emphysematous changes. No acute with pulmonary findings. No pleural
effusion or pneumothorax. The bony thorax is intact.
IMPRESSION: Stable emphysematous and pulmonary scarring changes. No acute
pulmonary findings.

## 2014-03-31 ENCOUNTER — Other Ambulatory Visit: Payer: Self-pay | Admitting: Internal Medicine

## 2014-04-02 ENCOUNTER — Encounter (HOSPITAL_COMMUNITY): Payer: Self-pay | Admitting: Emergency Medicine

## 2014-04-02 ENCOUNTER — Observation Stay (HOSPITAL_COMMUNITY)
Admission: EM | Admit: 2014-04-02 | Discharge: 2014-04-04 | Disposition: A | Discharge status: Home / Self Care | Payer: Medicare HMO | Attending: Family Medicine | Admitting: Family Medicine

## 2014-04-02 DIAGNOSIS — D649 Anemia, unspecified: Secondary | ICD-10-CM

## 2014-04-02 DIAGNOSIS — D62 Acute posthemorrhagic anemia: Secondary | ICD-10-CM | POA: Diagnosis present

## 2014-04-02 DIAGNOSIS — K317 Polyp of stomach and duodenum: Secondary | ICD-10-CM

## 2014-04-02 DIAGNOSIS — Z87891 Personal history of nicotine dependence: Secondary | ICD-10-CM | POA: Insufficient documentation

## 2014-04-02 DIAGNOSIS — J309 Allergic rhinitis, unspecified: Secondary | ICD-10-CM

## 2014-04-02 DIAGNOSIS — K625 Hemorrhage of anus and rectum: Secondary | ICD-10-CM

## 2014-04-02 DIAGNOSIS — F411 Generalized anxiety disorder: Secondary | ICD-10-CM

## 2014-04-02 DIAGNOSIS — E119 Type 2 diabetes mellitus without complications: Secondary | ICD-10-CM | POA: Insufficient documentation

## 2014-04-02 DIAGNOSIS — J4489 Other specified chronic obstructive pulmonary disease: Secondary | ICD-10-CM | POA: Insufficient documentation

## 2014-04-02 DIAGNOSIS — K921 Melena: Secondary | ICD-10-CM

## 2014-04-02 DIAGNOSIS — J449 Chronic obstructive pulmonary disease, unspecified: Secondary | ICD-10-CM | POA: Insufficient documentation

## 2014-04-02 DIAGNOSIS — I714 Abdominal aortic aneurysm, without rupture, unspecified: Secondary | ICD-10-CM | POA: Diagnosis present

## 2014-04-02 DIAGNOSIS — Z48812 Encounter for surgical aftercare following surgery on the circulatory system: Secondary | ICD-10-CM

## 2014-04-02 DIAGNOSIS — Z951 Presence of aortocoronary bypass graft: Secondary | ICD-10-CM | POA: Insufficient documentation

## 2014-04-02 DIAGNOSIS — I10 Essential (primary) hypertension: Secondary | ICD-10-CM | POA: Diagnosis present

## 2014-04-02 DIAGNOSIS — D509 Iron deficiency anemia, unspecified: Secondary | ICD-10-CM | POA: Insufficient documentation

## 2014-04-02 DIAGNOSIS — I252 Old myocardial infarction: Secondary | ICD-10-CM | POA: Insufficient documentation

## 2014-04-02 DIAGNOSIS — D72829 Elevated white blood cell count, unspecified: Secondary | ICD-10-CM

## 2014-04-02 DIAGNOSIS — K922 Gastrointestinal hemorrhage, unspecified: Secondary | ICD-10-CM | POA: Diagnosis present

## 2014-04-02 DIAGNOSIS — R109 Unspecified abdominal pain: Secondary | ICD-10-CM | POA: Insufficient documentation

## 2014-04-02 DIAGNOSIS — Z7982 Long term (current) use of aspirin: Secondary | ICD-10-CM | POA: Insufficient documentation

## 2014-04-02 DIAGNOSIS — I219 Acute myocardial infarction, unspecified: Secondary | ICD-10-CM

## 2014-04-02 DIAGNOSIS — D500 Iron deficiency anemia secondary to blood loss (chronic): Secondary | ICD-10-CM

## 2014-04-02 DIAGNOSIS — F32A Depression, unspecified: Secondary | ICD-10-CM

## 2014-04-02 DIAGNOSIS — E785 Hyperlipidemia, unspecified: Secondary | ICD-10-CM | POA: Diagnosis present

## 2014-04-02 DIAGNOSIS — K5520 Angiodysplasia of colon without hemorrhage: Secondary | ICD-10-CM

## 2014-04-02 DIAGNOSIS — J9611 Chronic respiratory failure with hypoxia: Secondary | ICD-10-CM

## 2014-04-02 DIAGNOSIS — R195 Other fecal abnormalities: Principal | ICD-10-CM | POA: Insufficient documentation

## 2014-04-02 DIAGNOSIS — F329 Major depressive disorder, single episode, unspecified: Secondary | ICD-10-CM

## 2014-04-02 DIAGNOSIS — I499 Cardiac arrhythmia, unspecified: Secondary | ICD-10-CM

## 2014-04-02 DIAGNOSIS — K31811 Angiodysplasia of stomach and duodenum with bleeding: Secondary | ICD-10-CM

## 2014-04-02 DIAGNOSIS — I2510 Atherosclerotic heart disease of native coronary artery without angina pectoris: Secondary | ICD-10-CM | POA: Diagnosis present

## 2014-04-02 LAB — GLUCOSE, CAPILLARY: Glucose-Capillary: 184 mg/dL — ABNORMAL HIGH (ref 70–99)

## 2014-04-02 LAB — MRSA PCR SCREENING: MRSA by PCR: NEGATIVE

## 2014-04-02 LAB — I-STAT CHEM 8, ED
BUN: 38 mg/dL — AB (ref 6–23)
CREATININE: 1 mg/dL (ref 0.50–1.35)
Calcium, Ion: 1.17 mmol/L (ref 1.13–1.30)
Chloride: 101 mEq/L (ref 96–112)
GLUCOSE: 185 mg/dL — AB (ref 70–99)
HCT: 33 % — ABNORMAL LOW (ref 39.0–52.0)
HEMOGLOBIN: 11.2 g/dL — AB (ref 13.0–17.0)
POTASSIUM: 4.8 meq/L (ref 3.7–5.3)
SODIUM: 139 meq/L (ref 137–147)
TCO2: 25 mmol/L (ref 0–100)

## 2014-04-02 LAB — I-STAT CG4 LACTIC ACID, ED: Lactic Acid, Venous: 1.83 mmol/L (ref 0.5–2.2)

## 2014-04-02 LAB — CBC WITH DIFFERENTIAL/PLATELET
BAND NEUTROPHILS: 3 % (ref 0–10)
BASOS PCT: 0 % (ref 0–1)
Basophils Absolute: 0 10*3/uL (ref 0.0–0.1)
Blasts: 0 %
EOS ABS: 0 10*3/uL (ref 0.0–0.7)
EOS PCT: 0 % (ref 0–5)
HEMATOCRIT: 32.6 % — AB (ref 39.0–52.0)
HEMOGLOBIN: 9.5 g/dL — AB (ref 13.0–17.0)
Lymphocytes Relative: 11 % — ABNORMAL LOW (ref 12–46)
Lymphs Abs: 1.5 10*3/uL (ref 0.7–4.0)
MCH: 23.8 pg — AB (ref 26.0–34.0)
MCHC: 29.1 g/dL — ABNORMAL LOW (ref 30.0–36.0)
MCV: 81.5 fL (ref 78.0–100.0)
METAMYELOCYTES PCT: 0 %
MONO ABS: 0.5 10*3/uL (ref 0.1–1.0)
MYELOCYTES: 0 %
Monocytes Relative: 4 % (ref 3–12)
Neutro Abs: 11.3 10*3/uL — ABNORMAL HIGH (ref 1.7–7.7)
Neutrophils Relative %: 82 % — ABNORMAL HIGH (ref 43–77)
PLATELETS: 270 10*3/uL (ref 150–400)
Promyelocytes Absolute: 0 %
RBC: 4 MIL/uL — AB (ref 4.22–5.81)
RDW: 18.5 % — ABNORMAL HIGH (ref 11.5–15.5)
WBC: 13.3 10*3/uL — ABNORMAL HIGH (ref 4.0–10.5)
nRBC: 0 /100 WBC

## 2014-04-02 LAB — COMPREHENSIVE METABOLIC PANEL
ALT: 9 U/L (ref 0–53)
AST: 13 U/L (ref 0–37)
Albumin: 3.1 g/dL — ABNORMAL LOW (ref 3.5–5.2)
Alkaline Phosphatase: 126 U/L — ABNORMAL HIGH (ref 39–117)
BUN: 38 mg/dL — ABNORMAL HIGH (ref 6–23)
CALCIUM: 9.4 mg/dL (ref 8.4–10.5)
CO2: 23 mEq/L (ref 19–32)
Chloride: 99 mEq/L (ref 96–112)
Creatinine, Ser: 0.91 mg/dL (ref 0.50–1.35)
GFR calc Af Amer: >90 mL/min (ref >90)
GFR calc non Af Amer: 83 mL/min — ABNORMAL LOW (ref >90)
Glucose, Bld: 180 mg/dL — ABNORMAL HIGH (ref 70–99)
Potassium: 4.8 mEq/L (ref 3.7–5.3)
SODIUM: 136 meq/L — AB (ref 137–147)
TOTAL PROTEIN: 6.5 g/dL (ref 6.0–8.3)
Total Bilirubin: 0.3 mg/dL (ref 0.3–1.2)

## 2014-04-02 LAB — URINALYSIS, ROUTINE W REFLEX MICROSCOPIC
Bilirubin Urine: NEGATIVE
GLUCOSE, UA: NEGATIVE mg/dL
HGB URINE DIPSTICK: NEGATIVE
Ketones, ur: NEGATIVE mg/dL
Leukocytes, UA: NEGATIVE
Nitrite: NEGATIVE
PROTEIN: NEGATIVE mg/dL
SPECIFIC GRAVITY, URINE: 1.022 (ref 1.005–1.030)
Urobilinogen, UA: 0.2 mg/dL (ref 0.0–1.0)
pH: 5.5 (ref 5.0–8.0)

## 2014-04-02 LAB — POC OCCULT BLOOD, ED: Fecal Occult Bld: POSITIVE — AB

## 2014-04-02 LAB — LIPASE, BLOOD: Lipase: 47 U/L (ref 11–59)

## 2014-04-02 MED ORDER — ALPRAZOLAM 0.5 MG PO TABS
0.5000 mg | ORAL_TABLET | Freq: Three times a day (TID) | ORAL | Status: DC | PRN
  Administered 2014-04-02: 0.5 mg via ORAL

## 2014-04-02 MED ORDER — SODIUM CHLORIDE 0.9 % IV SOLN
80.0000 mg | Freq: Once | INTRAVENOUS | Status: AC
  Administered 2014-04-02: 80 mg via INTRAVENOUS
  Filled 2014-04-02: qty 80

## 2014-04-02 MED ORDER — BUDESONIDE-FORMOTEROL FUMARATE 160-4.5 MCG/ACT IN AERO
2.0000 | INHALATION_SPRAY | Freq: Two times a day (BID) | RESPIRATORY_TRACT | Status: DC
  Administered 2014-04-02 – 2014-04-04 (×4): 2 via RESPIRATORY_TRACT
  Filled 2014-04-02: qty 6

## 2014-04-02 MED ORDER — HYDROCODONE-ACETAMINOPHEN 5-325 MG PO TABS
1.0000 | ORAL_TABLET | Freq: Four times a day (QID) | ORAL | Status: DC | PRN
  Administered 2014-04-03 (×2): 1 via ORAL
  Filled 2014-04-02 (×3): qty 1

## 2014-04-02 MED ORDER — SODIUM CHLORIDE 0.9 % IJ SOLN
3.0000 mL | Freq: Two times a day (BID) | INTRAMUSCULAR | Status: DC
  Administered 2014-04-04: 3 mL via INTRAVENOUS

## 2014-04-02 MED ORDER — PANTOPRAZOLE SODIUM 40 MG PO TBEC
40.0000 mg | DELAYED_RELEASE_TABLET | Freq: Two times a day (BID) | ORAL | Status: DC
  Administered 2014-04-02 – 2014-04-04 (×4): 40 mg via ORAL
  Filled 2014-04-02 (×6): qty 1

## 2014-04-02 MED ORDER — SODIUM CHLORIDE 0.9 % IV BOLUS (SEPSIS)
1000.0000 mL | Freq: Once | INTRAVENOUS | Status: AC
  Administered 2014-04-02: 1000 mL via INTRAVENOUS

## 2014-04-02 MED ORDER — INSULIN ASPART 100 UNIT/ML ~~LOC~~ SOLN
0.0000 [IU] | Freq: Three times a day (TID) | SUBCUTANEOUS | Status: DC
  Administered 2014-04-03 – 2014-04-04 (×5): 3 [IU] via SUBCUTANEOUS

## 2014-04-02 MED ORDER — ONDANSETRON HCL 4 MG/2ML IJ SOLN: 4.0000 mg | Freq: Four times a day (QID) | INTRAMUSCULAR | Status: DC | PRN

## 2014-04-02 MED ORDER — FOLIC ACID 1 MG PO TABS
2.0000 mg | ORAL_TABLET | Freq: Every morning | ORAL | Status: DC
  Administered 2014-04-03 – 2014-04-04 (×2): 2 mg via ORAL
  Filled 2014-04-02 (×2): qty 2

## 2014-04-02 MED ORDER — METOPROLOL TARTRATE 25 MG PO TABS
25.0000 mg | ORAL_TABLET | Freq: Two times a day (BID) | ORAL | Status: DC
  Administered 2014-04-02 – 2014-04-04 (×4): 25 mg via ORAL
  Filled 2014-04-02 (×5): qty 1

## 2014-04-02 MED ORDER — ONDANSETRON HCL 4 MG PO TABS: 4.0000 mg | ORAL_TABLET | Freq: Four times a day (QID) | ORAL | Status: DC | PRN

## 2014-04-02 MED ORDER — TIOTROPIUM BROMIDE MONOHYDRATE 18 MCG IN CAPS
18.0000 ug | ORAL_CAPSULE | Freq: Every day | RESPIRATORY_TRACT | Status: DC
  Administered 2014-04-02 – 2014-04-04 (×3): 18 ug via RESPIRATORY_TRACT
  Filled 2014-04-02: qty 5

## 2014-04-02 MED ORDER — ATORVASTATIN CALCIUM 40 MG PO TABS
40.0000 mg | ORAL_TABLET | Freq: Every day | ORAL | Status: DC
  Administered 2014-04-02 – 2014-04-03 (×2): 40 mg via ORAL
  Filled 2014-04-02 (×3): qty 1

## 2014-04-02 MED ORDER — ZOLPIDEM TARTRATE 5 MG PO TABS: 5.0000 mg | ORAL_TABLET | Freq: Once | ORAL | Status: DC

## 2014-04-02 MED ORDER — CITALOPRAM HYDROBROMIDE 20 MG PO TABS
20.0000 mg | ORAL_TABLET | Freq: Every day | ORAL | Status: DC
  Administered 2014-04-02 – 2014-04-04 (×3): 20 mg via ORAL
  Filled 2014-04-02 (×3): qty 1

## 2014-04-02 MED ORDER — ONDANSETRON HCL 4 MG/2ML IJ SOLN
4.0000 mg | Freq: Once | INTRAMUSCULAR | Status: AC
  Administered 2014-04-02: 4 mg via INTRAVENOUS
  Filled 2014-04-02: qty 2

## 2014-04-02 MED ORDER — ALBUTEROL SULFATE (2.5 MG/3ML) 0.083% IN NEBU: 2.5000 mg | INHALATION_SOLUTION | Freq: Four times a day (QID) | RESPIRATORY_TRACT | Status: DC | PRN

## 2014-04-02 NOTE — Progress Notes (Signed)
Utilization Review completed.  Amy Ferrero RN CM  

## 2014-04-02 NOTE — Progress Notes (Signed)
Pt advised our secretary that he has not had Ambien in 3 months and he wants it tonight.   I checked pt's MAR and Ambien had not been ordered, however Xanax had. I ask pt if he would rather have that. Pt became enraged stating "He had been medically cleared for Ambien and that it was in his medical record."   I paged [facility] and was given an order for Ambien 5mg . When I advised the pt he stated "5mg  would not do crap for him that he needed at least 10mg  and that he guessed he would need to speak to the MD because we always give him 10mg ." I advised at this time the NP feels at 5 mg would be best in the hospital setting.

## 2014-04-02 NOTE — ED Notes (Signed)
IV team paged.  

## 2014-04-02 NOTE — ED Notes (Signed)
Pt still unable to void at this time 

## 2014-04-02 NOTE — ED Provider Notes (Signed)
Pt has a hx of GI bleeding, reports he started having black stools the past two days and feeling weak and dizzy. Today he to the bathroom and passed a large amount of black stool. He states he was so weak he could hardly get back to his bed. He states he almost passed out. He also had another episode shortly after arrival to the ED. He does not have a significant abdominal pain although he complains of rectal pain.  Patient is alert sitting on the side of the stretcher. He is pale. He is alert and cooperative  Medical screening examination/treatment/procedure(s) were conducted as a shared visit with non-physician practitioner(s) and myself.  I personally evaluated the patient during the encounter.   EKG Interpretation   Date/Time:  Wednesday April 02 2014 18:52:41 EDT Ventricular Rate:  123 PR Interval:  135 QRS Duration: 123 QT Interval:  341 QTC Calculation: 488 R Axis:   -110 Text Interpretation:  Sinus tachycardia RBBB and LAFB Baseline wander in  lead(s) II III aVL aVF No significant change since last tracing 04 Dec 2013 Confirmed by [facility]  MD-I, IVA ([id number]) on 04/02/2014 7:37:59 PM       Rolland Porter, MD, Alanson Aly, MD 04/02/14 1942

## 2014-04-02 NOTE — ED Notes (Signed)
Multiple unsuccessful IV attempts made by Agricultural consultant. Will call IV team.

## 2014-04-02 NOTE — ED Notes (Signed)
Attempted to call report, RN unavailable at this time.

## 2014-04-02 NOTE — ED Notes (Signed)
MD at bedside. 

## 2014-04-02 NOTE — ED Notes (Signed)
Per EMS-was here and admitted for the same symptoms on the on 2 nd-woke up this am with black tarry stools-no abdominal pain-states left arm pain r/t to "F'in IV" we placed when he was here-took 325 mg ASA this am for the pain-BP 110/70, HR 118

## 2014-04-02 NOTE — Progress Notes (Signed)
Wife called concerned about husbands medical status. Pt stated he was in ICU and this was alarming to her.   During the conversation the wife shared information that she feels is related to his admission.   She shared that he has received a letter of dismissal from his PCP Dr. Arnette Norris and is unable to get his hydrocodone medication. Last night he took one of her 800 mg Ibuprofen followed by 325 ASA. The Hydrocodone was prescribed to for pain relief, due to being bed ridden. At this point his wife states that he lies in bed all day watching TV or on his laptop. Recently she has refused to bring his meals to his bed in order to force him to get up and move.

## 2014-04-02 NOTE — Progress Notes (Signed)
Discussed level of care with Dr. Wendee Beavers.  As per admission order placed by Dr. Wendee Beavers, patient was meant to be placed in telemetry bed.  As per Dr. Wendee Beavers, patient stable enough to be on telemetry floor.  EDCM discussed with stepdown RN who is in agreement with Dr. Wendee Beavers.  EDCM called AC who will attempt to find telemetry bed for patient.
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
## 2014-04-02 NOTE — ED Notes (Signed)
Patient is unable to provide a urine sample at this time, and patient refused a catheter. Urinal is placed at bedside.

## 2014-04-02 NOTE — ED Notes (Signed)
Pt woke up and began to have black tarry stools. Pt. Previously admitted for this in the past. Pt sts he feels the same as last time he was admitted.

## 2014-04-02 NOTE — ED Notes (Signed)
IV team unsuccessful. RN attempting Ultrasound IV.

## 2014-04-02 NOTE — ED Notes (Signed)
Bed: WA18 Expected date:  Expected time:  Means of arrival:  Comments: EMS GI bleed 
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
## 2014-04-03 ENCOUNTER — Encounter (HOSPITAL_COMMUNITY): Payer: Self-pay

## 2014-04-03 ENCOUNTER — Observation Stay (HOSPITAL_COMMUNITY): Payer: Medicare HMO

## 2014-04-03 DIAGNOSIS — D62 Acute posthemorrhagic anemia: Secondary | ICD-10-CM

## 2014-04-03 DIAGNOSIS — I714 Abdominal aortic aneurysm, without rupture, unspecified: Secondary | ICD-10-CM

## 2014-04-03 LAB — CBC
HCT: 29.9 % — ABNORMAL LOW (ref 39.0–52.0)
HCT: 27.8 % — ABNORMAL LOW (ref 39.0–52.0)
HEMATOCRIT: 33.2 % — AB (ref 39.0–52.0)
HEMOGLOBIN: 8 g/dL — AB (ref 13.0–17.0)
Hemoglobin: 9.9 g/dL — ABNORMAL LOW (ref 13.0–17.0)
Hemoglobin: 8.5 g/dL — ABNORMAL LOW (ref 13.0–17.0)
MCH: 24.3 pg — ABNORMAL LOW (ref 26.0–34.0)
MCH: 23.7 pg — ABNORMAL LOW (ref 26.0–34.0)
MCH: 24 pg — ABNORMAL LOW (ref 26.0–34.0)
MCHC: 29.8 g/dL — ABNORMAL LOW (ref 30.0–36.0)
MCHC: 28.4 g/dL — ABNORMAL LOW (ref 30.0–36.0)
MCHC: 28.8 g/dL — ABNORMAL LOW (ref 30.0–36.0)
MCV: 81.6 fL (ref 78.0–100.0)
MCV: 83.3 fL (ref 78.0–100.0)
MCV: 83.5 fL (ref 78.0–100.0)
PLATELETS: 278 10*3/uL (ref 150–400)
PLATELETS: 244 10*3/uL (ref 150–400)
Platelets: 191 10*3/uL (ref 150–400)
RBC: 4.07 MIL/uL — ABNORMAL LOW (ref 4.22–5.81)
RBC: 3.59 MIL/uL — AB (ref 4.22–5.81)
RBC: 3.33 MIL/uL — AB (ref 4.22–5.81)
RDW: 18.5 % — ABNORMAL HIGH (ref 11.5–15.5)
RDW: 18.6 % — ABNORMAL HIGH (ref 11.5–15.5)
RDW: 19 % — ABNORMAL HIGH (ref 11.5–15.5)
WBC: 13.3 10*3/uL — ABNORMAL HIGH (ref 4.0–10.5)
WBC: 13.7 10*3/uL — ABNORMAL HIGH (ref 4.0–10.5)
WBC: 9.8 10*3/uL (ref 4.0–10.5)

## 2014-04-03 LAB — BASIC METABOLIC PANEL
BUN: 29 mg/dL — ABNORMAL HIGH (ref 6–23)
CO2: 26 meq/L (ref 19–32)
Calcium: 8.6 mg/dL (ref 8.4–10.5)
Chloride: 103 mEq/L (ref 96–112)
Creatinine, Ser: 1.08 mg/dL (ref 0.50–1.35)
GFR calc non Af Amer: 67 mL/min — ABNORMAL LOW (ref >90)
GFR, EST AFRICAN AMERICAN: 77 mL/min — AB (ref >90)
GLUCOSE: 209 mg/dL — AB (ref 70–99)
POTASSIUM: 4.1 meq/L (ref 3.7–5.3)
Sodium: 139 mEq/L (ref 137–147)

## 2014-04-03 LAB — IRON AND TIBC
Iron: 11 ug/dL — ABNORMAL LOW (ref 42–135)
SATURATION RATIOS: 3 % — AB (ref 20–55)
TIBC: 360 ug/dL (ref 215–435)
UIBC: 349 ug/dL (ref 125–400)

## 2014-04-03 LAB — GLUCOSE, CAPILLARY
GLUCOSE-CAPILLARY: 160 mg/dL — AB (ref 70–99)
GLUCOSE-CAPILLARY: 228 mg/dL — AB (ref 70–99)
Glucose-Capillary: 158 mg/dL — ABNORMAL HIGH (ref 70–99)
Glucose-Capillary: 178 mg/dL — ABNORMAL HIGH (ref 70–99)

## 2014-04-03 LAB — FERRITIN: Ferritin: 16 ng/mL — ABNORMAL LOW (ref 22–322)

## 2014-04-03 LAB — URINE CULTURE
Colony Count: NO GROWTH
Culture: NO GROWTH

## 2014-04-03 LAB — PREPARE RBC (CROSSMATCH)

## 2014-04-03 MED ORDER — ZOLPIDEM TARTRATE 10 MG PO TABS
10.0000 mg | ORAL_TABLET | Freq: Every evening | ORAL | Status: DC | PRN
  Administered 2014-04-03 (×2): 10 mg via ORAL
  Filled 2014-04-03 (×2): qty 1

## 2014-04-03 MED ORDER — TECHNETIUM TC 99M-LABELED RED BLOOD CELLS IV KIT
27.5000 | PACK | Freq: Once | INTRAVENOUS | Status: AC | PRN
  Administered 2014-04-03: 28 via INTRAVENOUS

## 2014-04-03 NOTE — Progress Notes (Signed)
[facility] PROGRESS NOTE  [name] POE:[id number] DOB: April 14, 1941 DOA: 04/02/2014 PCP: Patricia Nettle, MD  Assessment/Plan:  GI bleed/Melena - Hb is down to 8.5 - Most likely secondary to AVM (arteriovenous malformation)  - Called and discussed with Dr Benson Norway, patient had multiple EGD's and colonoscopy with capsule endoscopy in the past, no role for repeat intervention at this time. Will get Nuclear GI blood loss scan.  Acute blood loss anemia  - Given history of CAD status post CABG would transfuse two units PRBC - Hold iron supplementation - called and discussed with hematology, will obtain iron, TIBC and ferritin   HYPERLIPIDEMIA  - Continue statin, stable   HYPERTENSION  - Continue beta blocker   Anemia  CAD (coronary artery disease)  - Hold aspirin given GI bleed and will defer when patient can continue aspirin to gastroenterologist   AAA (abdominal aortic aneurysm)  - Continue beta blocker    Code Status: Full code Family Communication: No family at bedside Disposition Plan: Home when stable   Consultants:  GI  Procedures:  None  Antibiotics: None  HPI/Subjective: Patient seen and examined, admitted with anemia and GI bleed. FOBT is positive.  Objective: Filed Vitals:   04/03/14 0555  BP: 101/56  Pulse: 90  Temp: 98.1 F (36.7 C)  Resp: 20    Intake/Output Summary (Last 24 hours) at 04/03/14 1107 Last data filed at 04/03/14 3154  Gross per 24 hour  Intake    720 ml  Output    200 ml  Net    520 ml   Filed Weights   04/02/14 2011 04/02/14 2349  Weight: 115.4 kg (254 lb 6.6 oz) 116.1 kg (255 lb 15.3 oz)    Exam:  Physical Exam: Head: Normocephalic, atraumatic.  Lungs: Normal respiratory effort. B/L Clear to auscultation, no crackles or wheezes.  Heart: Regular RR. S1 and S2 normal  Abdomen: BS normoactive. Soft, Nondistended, non-tender.  Extremities: No pretibial edema, no erythema  Data Reviewed: Basic Metabolic  Panel:  Recent Labs Lab 04/02/14 1557 04/02/14 1732 04/03/14 0437  NA 136* 139 139  K 4.8 4.8 4.1  CL 99 101 103  CO2 23  --  26  GLUCOSE 180* 185* 209*  BUN 38* 38* 29*  CREATININE 0.91 1.00 1.08  CALCIUM 9.4  --  8.6   Liver Function Tests:  Recent Labs Lab 04/02/14 1557  AST 13  ALT 9  ALKPHOS 126*  BILITOT 0.3  PROT 6.5  ALBUMIN 3.1*    Recent Labs Lab 04/02/14 1557  LIPASE 47   No results found for this basename: AMMONIA,  in the last 168 hours CBC:  Recent Labs Lab 04/02/14 1557 04/02/14 1732 04/02/14 2055 04/03/14 0437  WBC 13.3*  --  13.3* 13.7*  NEUTROABS 11.3*  --   --   --   HGB 9.5* 11.2* 9.9* 8.5*  HCT 32.6* 33.0* 33.2* 29.9*  MCV 81.5  --  81.6 83.3  PLT 270  --  191 278   Cardiac Enzymes: No results found for this basename: CKTOTAL, CKMB, CKMBINDEX, TROPONINI,  in the last 168 hours BNP (last 3 results)  Recent Labs  08/19/13 1531 09/07/13 2020 10/15/13 1534  PROBNP 596.9* 293.6* 487.1*   CBG:  Recent Labs Lab 04/02/14 2142 04/03/14 0734  GLUCAP 184* 158*    Recent Results (from the past 240 hour(s))  MRSA PCR SCREENING     Status: None   Collection Time    04/02/14  7:59 PM  Result Value Ref Range Status   MRSA by PCR NEGATIVE  NEGATIVE Final   Comment:            The GeneXpert MRSA Assay (FDA     approved for NASAL specimens     only), is one component of a     comprehensive MRSA colonization     surveillance program. It is not     intended to diagnose MRSA     infection nor to guide or     monitor treatment for     MRSA infections.     Studies: No results found.  Scheduled Meds: . atorvastatin  40 mg Oral QHS  . budesonide-formoterol  2 puff Inhalation BID  . citalopram  20 mg Oral Daily  . folic acid  2 mg Oral q morning - 10a  . insulin aspart  0-15 Units Subcutaneous TID WC  . metoprolol tartrate  25 mg Oral BID  . pantoprazole  40 mg Oral BID WC  . sodium chloride  3 mL Intravenous Q12H  .  tiotropium  18 mcg Inhalation Daily   Continuous Infusions:   Active Problems:   HYPERLIPIDEMIA   HYPERTENSION   Anemia   CAD (coronary artery disease)   AAA (abdominal aortic aneurysm)   GI bleed   Acute blood loss anemia   AVM (arteriovenous malformation) of colon    Time spent: *25 min    [facility]  [facility] Pager [phone]*. If 7PM-7AM, please contact night-coverage at [email or web address], password [facility] 04/03/2014, 11:07 AM  LOS: 1 day

## 2014-04-03 NOTE — Progress Notes (Signed)
Subjective: No GI complaints at this time.  Objective: Vital signs in last 24 hours: Temp:  [98.1 F (36.7 C)-98.5 F (36.9 C)] 98.1 F (36.7 C) (06/18 0555) Pulse Rate:  [90-126] 90 (06/18 0555) Resp:  [18-24] 20 (06/18 0555) BP: (101-159)/(56-89) 101/56 mmHg (06/18 0555) SpO2:  [94 %-100 %] 94 % (06/18 1037) Weight:  [254 lb 6.6 oz (115.4 kg)-255 lb 15.3 oz (116.1 kg)] 255 lb 15.3 oz (116.1 kg) (06/17 2349) Last BM Date: 04/02/14  Intake/Output from previous day: 06/17 0701 - 06/18 0700 In: 480 [P.O.:480] Out: 200 [Urine:200] Intake/Output this shift: Total I/O In: 240 [P.O.:240] Out: 200 [Urine:200]  General appearance: alert and no distress GI: soft, non-tender; bowel sounds normal; no masses,  no organomegaly  Lab Results:  Recent Labs  04/02/14 2055 04/03/14 0437 04/03/14 1255  WBC 13.3* 13.7* 9.8  HGB 9.9* 8.5* 8.0*  HCT 33.2* 29.9* 27.8*  PLT 191 278 244   BMET  Recent Labs  04/02/14 1557 04/02/14 1732 04/03/14 0437  NA 136* 139 139  K 4.8 4.8 4.1  CL 99 101 103  CO2 23  --  26  GLUCOSE 180* 185* 209*  BUN 38* 38* 29*  CREATININE 0.91 1.00 1.08  CALCIUM 9.4  --  8.6   LFT  Recent Labs  04/02/14 1557  PROT 6.5  ALBUMIN 3.1*  AST 13  ALT 9  ALKPHOS 126*  BILITOT 0.3   PT/INR No results found for this basename: LABPROT, INR,  in the last 72 hours Hepatitis Panel No results found for this basename: HEPBSAG, HCVAB, HEPAIGM, HEPBIGM,  in the last 72 hours C-Diff No results found for this basename: CDIFFTOX,  in the last 72 hours Fecal Lactopherrin No results found for this basename: FECLLACTOFRN,  in the last 72 hours  Studies/Results: No results found.  Medications:  Scheduled: . atorvastatin  40 mg Oral QHS  . budesonide-formoterol  2 puff Inhalation BID  . citalopram  20 mg Oral Daily  . folic acid  2 mg Oral q morning - 10a  . insulin aspart  0-15 Units Subcutaneous TID WC  . metoprolol tartrate  25 mg Oral BID  .  pantoprazole  40 mg Oral BID WC  . sodium chloride  3 mL Intravenous Q12H  . tiotropium  18 mcg Inhalation Daily   Continuous:   Assessment/Plan: 1) GI bleed. 2) Symptomatic anemia.   I am very familiar with Mr. Hawker case.  He is certainly has a source of GI bleed, but the exact site/sites cannot be identified.  He had a reprieve with his bleeding in the recent past as cecal AVMs were identified after a positive capsule endoscopy.  Unfortunately he rebled and my last colonoscopy did not reveal any significant sites of bleeding. In total he has had 7-8 EGDs, 4-5 colonoscopies, 2 capsule endoscopies, and 2 bleeding scans.  I cannot perform any further procedures.  I tried as an outpatient to have him come in on a routine basis to keep a track of his HGB to prevent any emergent hospitalizations.  Traveling is difficult for him and at times he simply did not follow up.  At other times, his bleeding can start rather abruptly and he has very little reserve to tolerate his symptoms of anemia.  I think he can benefit from routine iron infusions to try and minimize his blood transfusion requirements.  Plan: 1) Hematology consultation for iron infusions. 2) Follow HGB and transfuse as necessary.   LOS: 1  day   [name],[name] 04/03/2014, 1:17 PM

## 2014-04-03 NOTE — Progress Notes (Signed)
Called NP to discuss Ambien order.Marland KitchenMarland KitchenAmbien changed to Ambien 10 mg. Pt very adament at Ambien dose. Follow up completed with pt. SRP, RN

## 2014-04-04 DIAGNOSIS — Q2733 Arteriovenous malformation of digestive system vessel: Secondary | ICD-10-CM

## 2014-04-04 LAB — CBC
HEMATOCRIT: 32.2 % — AB (ref 39.0–52.0)
HEMOGLOBIN: 9.9 g/dL — AB (ref 13.0–17.0)
MCH: 25.2 pg — ABNORMAL LOW (ref 26.0–34.0)
MCHC: 30.7 g/dL (ref 30.0–36.0)
MCV: 81.9 fL (ref 78.0–100.0)
Platelets: 175 10*3/uL (ref 150–400)
RBC: 3.93 MIL/uL — AB (ref 4.22–5.81)
RDW: 17.7 % — ABNORMAL HIGH (ref 11.5–15.5)
WBC: 10.3 10*3/uL (ref 4.0–10.5)

## 2014-04-04 LAB — GLUCOSE, CAPILLARY
GLUCOSE-CAPILLARY: 173 mg/dL — AB (ref 70–99)
Glucose-Capillary: 182 mg/dL — ABNORMAL HIGH (ref 70–99)

## 2014-04-04 NOTE — Discharge Summary (Addendum)
Physician Discharge Summary  [name] FAO:[id number] DOB: Jan 13, 1941 DOA: 04/02/2014  PCP: Patricia Nettle, MD  Admit date: 04/02/2014 Discharge date: 04/04/2014  Time spent: 50 minutes  Recommendations for Outpatient Follow-up:  1. *Follow up  PCP in 2 weeks, check a CBC, for Blood transfusion if Hb drops less than 8.0  Discharge Diagnoses:  Active Problems:   HYPERLIPIDEMIA   HYPERTENSION   Anemia   CAD (coronary artery disease)   AAA (abdominal aortic aneurysm)   GI bleed   Acute blood loss anemia   AVM (arteriovenous malformation) of colon   Discharge Condition: Stable  Diet recommendation: Low-fat diet  Filed Weights   04/02/14 2011 04/02/14 2349  Weight: 115.4 kg (254 lb 6.6 oz) 116.1 kg (255 lb 15.3 oz)    History of present illness:  73 y.o. male  With history of CAD status post CABG, DM on metformin, history of AAA, hyperlipidemia, hypertension, history o fCecal AVMs s/p APC at the beginning of this month. He presents with the above complaints stating that for the last 2 days he has noticed black tarry stools. He states he has some mild abdominal discomfort which is worsened with bowel movements. He denies any fevers or chills. Otherwise has no other complaints  Hemoglobin levels have varied from 11.2 to 9.5. We were consulted for admission evaluation and recommendations. ED physicians have consulted gastroenterology for further evaluation and recommendations.      Hospital Course:  GI bleed/Melena  Patient admitted with GI bleed, had guaiac-positive stools. He was 8.5 so 2 units of blood transfusion were ordered. Patient received 2 units PRBC, also underwent nuclear GI blood loss scan which was negative for bleed. He was seen by GI Dr. Benson Norway, who recommended that patient had multiple EGD's and colonoscopy with capsule endoscopy in the past, no role for repeat intervention at this time. He recommended the patient gets IV  Iron  and  followup hematology for, as  needed blood transfusions, patient did not like this plan. He became angry, and insisted on getting more procedures to find the real cause of bleeding. Explained to him that GI does not plan to do any further procedures, and recommend only when necessary blood transfusions.  He became manipulative, and insisted on being discharged. As per patient he would rather die at home than to come for blood transfusions. I called and discussed patient's concern with Dr. Benson Norway who also saw the patient today. Patient in no way was told that he has terminal illness, see Dr. Ulyses Amor note for details.  At this time he is refusing hematology evaluation for possible IV iron. Also called and discussed with patient's wife, regarding above. She will try to convince patient to get blood transfusions .   Acute blood loss anemia  - Given history of CAD status post CABG would transfuse two units PRBC  - Patient does have iron deficiency anemia - He does not want to follow hematology for further evaluation or IV iron therapy or blood transfusion - Would still recommend to check CBC in 2 weeks at his PCP office in case he changes his mind for blood transfusion. - Stop taking ibuprofen and aspirin  HYPERLIPIDEMIA  - Continue statin, stable   HYPERTENSION  - Continue beta blocker   Anemia  CAD (coronary artery disease)  Will discontinue aspirin at this time due to repeat episodes of GI bleed  AAA (abdominal aortic aneurysm)  - Continue beta blocker    Procedures:  None  Consultations:  GI  Discharge Exam: Filed Vitals:   04/04/14 0411  BP: 110/69  Pulse: 72  Temp: 98.5 F (36.9 C)  Resp: 20    Did not examine the patient, as he refused to be examined  Discharge Instructions You were cared for by a hospitalist during your hospital stay. If you have any questions about your discharge medications or the care you received while you were in the hospital after you are discharged, you can call the unit  and asked to speak with the hospitalist on call if the hospitalist that took care of you is not available. Once you are discharged, your primary care physician will handle any further medical issues. Please note that NO REFILLS for any discharge medications will be authorized once you are discharged, as it is imperative that you return to your primary care physician (or establish a relationship with a primary care physician if you do not have one) for your aftercare needs so that they can reassess your need for medications and monitor your lab values.  Discharge Instructions   Diet - low sodium heart healthy    Complete by:  As directed      Increase activity slowly    Complete by:  As directed             Medication List    STOP taking these medications       aspirin 325 MG tablet     ibuprofen 800 MG tablet  Commonly known as:  ADVIL,MOTRIN      TAKE these medications       albuterol 108 (90 BASE) MCG/ACT inhaler  Commonly known as:  PROVENTIL HFA;VENTOLIN HFA  Inhale 2 puffs into the lungs every 6 (six) hours as needed for wheezing.     ALPRAZolam 0.5 MG tablet  Commonly known as:  XANAX  Take 1 tablet (0.5 mg total) by mouth 3 (three) times daily as needed for anxiety.     atorvastatin 40 MG tablet  Commonly known as:  LIPITOR  Take 1 tablet (40 mg total) by mouth at bedtime.     budesonide-formoterol 160-4.5 MCG/ACT inhaler  Commonly known as:  SYMBICORT  Inhale 2 puffs into the lungs 2 (two) times daily.     citalopram 20 MG tablet  Commonly known as:  CELEXA  Take 1 tablet (20 mg total) by mouth daily.     clobetasol ointment 0.05 %  Commonly known as:  TEMOVATE  Apply 1 application topically 2 (two) times daily as needed (rash).     folic acid 1 MG tablet  Commonly known as:  FOLVITE  Take 2 tablets (2 mg total) by mouth every morning.     HYDROcodone-acetaminophen 5-325 MG per tablet  Commonly known as:  NORCO/VICODIN  Take 1 tablet by mouth every 6 (six)  hours as needed for moderate pain.     iron polysaccharides 150 MG capsule  Commonly known as:  NIFEREX  Take 150 mg by mouth 2 (two) times daily.     metFORMIN 500 MG tablet  Commonly known as:  GLUCOPHAGE  Take 500 mg by mouth 2 (two) times daily with a meal.     metoprolol tartrate 25 MG tablet  Commonly known as:  LOPRESSOR  Take 1 tablet (25 mg total) by mouth 2 (two) times daily.     pantoprazole 40 MG tablet  Commonly known as:  PROTONIX  Take 40 mg by mouth 2 (two) times daily.     tiotropium 18 MCG  inhalation capsule  Commonly known as:  SPIRIVA  Place 18 mcg into inhaler and inhale daily.       Allergies  Allergen Reactions  . Penicillins Anaphylaxis and Hives  . Demerol [Meperidine] Other (See Comments)    hallucinations  . Dilaudid [Hydromorphone Hcl] Other (See Comments)    hallucinations  . Levaquin [Levofloxacin In D5w]     Nausea and diarrhea  . Morphine And Related Nausea Only and Other (See Comments)    hallucinations       Follow-up Information   Follow up with Patricia Nettle, MD In 2 weeks. (Get the Blood work CBC in 2 weeks, for as needed blood transfusion)    Specialty:  Cardiology   Contact information:   [place]  [phone]        The results of significant diagnostics from this hospitalization (including imaging, microbiology, ancillary and laboratory) are listed below for reference.    Significant Diagnostic Studies: Nm Gi Blood Loss  04/03/2014   CLINICAL DATA:  GI bleed.  Melena  EXAM: NUCLEAR MEDICINE GASTROINTESTINAL BLEEDING SCAN  TECHNIQUE: Sequential abdominal images were obtained following intravenous administration of Tc-12m labeled red blood cells.  RADIOPHARMACEUTICALS:  27.5 mCi Tc-23m in-vitro labeled red cells.  COMPARISON:  12/06/2013  FINDINGS: No active GI bleeding site is identified.  IMPRESSION: Negative nuclear medicine GI bleeding study.   Electronically Signed   By: Kalman Jewels M.D.   On: 04/03/2014 17:21    Microbiology: Recent Results (from the past 240 hour(s))  URINE CULTURE     Status: None   Collection Time    04/02/14  5:44 PM      Result Value Ref Range Status   Specimen Description URINE, CLEAN CATCH   Final   Special Requests NONE   Final   Culture  Setup Time     Final   Value: 04/02/2014 21:14     Performed at [facility]     Final   Value: NO GROWTH     Performed at [facility]   Culture     Final   Value: NO GROWTH     Performed at [facility]   Report Status 04/03/2014 FINAL   Final  MRSA PCR SCREENING     Status: None   Collection Time    04/02/14  7:59 PM      Result Value Ref Range Status   MRSA by PCR NEGATIVE  NEGATIVE Final   Comment:            The GeneXpert MRSA Assay (FDA     approved for NASAL specimens     only), is one component of a     comprehensive MRSA colonization     surveillance program. It is not     intended to diagnose MRSA     infection nor to guide or     monitor treatment for     MRSA infections.     Labs: Basic Metabolic Panel:  Recent Labs Lab 04/02/14 1557 04/02/14 1732 04/03/14 0437  NA 136* 139 139  K 4.8 4.8 4.1  CL 99 101 103  CO2 23  --  26  GLUCOSE 180* 185* 209*  BUN 38* 38* 29*  CREATININE 0.91 1.00 1.08  CALCIUM 9.4  --  8.6   Liver Function Tests:  Recent Labs Lab 04/02/14 1557  AST 13  ALT 9  ALKPHOS 126*  BILITOT 0.3  PROT 6.5  ALBUMIN 3.1*    Recent Labs Lab 04/02/14 1557  LIPASE 47   No results found for this basename: AMMONIA,  in the last 168 hours CBC:  Recent Labs Lab 04/02/14 1557 04/02/14 1732 04/02/14 2055 04/03/14 0437 04/03/14 1255 04/04/14 0634  WBC 13.3*  --  13.3* 13.7* 9.8 10.3  NEUTROABS 11.3*  --   --   --   --   --   HGB 9.5* 11.2* 9.9* 8.5* 8.0* 9.9*  HCT 32.6* 33.0* 33.2* 29.9* 27.8* 32.2*  MCV 81.5  --  81.6 83.3 83.5 81.9  PLT 270  --  191 278 244 175   Cardiac Enzymes: No  results found for this basename: CKTOTAL, CKMB, CKMBINDEX, TROPONINI,  in the last 168 hours BNP: BNP (last 3 results)  Recent Labs  08/19/13 1531 09/07/13 2020 10/15/13 1534  PROBNP 596.9* 293.6* 487.1*   CBG:  Recent Labs Lab 04/03/14 1139 04/03/14 1750 04/03/14 2049 04/04/14 0737 04/04/14 1157  GLUCAP 178* 160* 228* 182* 173*       Signed:  LAMA,GAGAN S  [facility] 04/04/2014, 12:32 PM

## 2014-04-04 NOTE — Progress Notes (Signed)
UR Completed Brenda Graves-Bigelow, RN,BSN [phone]  

## 2014-04-04 NOTE — Progress Notes (Signed)
Discharge to home, wife at bedside, d/c instructions and follow up appointments done and  discussed  with wife  Because patient does not to listen for the instructions. PIV removed no s/s of infiltration or swelling noted on insertion site.

## 2014-04-04 NOTE — Progress Notes (Signed)
I attempted to speak with Mr. Gelpi today, but he was rather reticent.  To be more precise, he was being manipulative.  He stated that he wanted to be discharged home so he can die.  I never mentioned anything about terminal illness with him yesterday or even today.  I told him that I have tried my best to isolate and correct the bleeding issue, but I cannot continue to perform procedures without end.  My belief is that he can have good quality of life with routine follow up of his HGB and iron infusions.  He did not want to hear anything I had to say.  I also discussed the situation with his wife.    At this point I do not have anything to offer.  If he wants to go home to die, then it is his choice.  I have been more than tolerant and compliant with his difficult personality.  His current manipulative actions are beyond my level of tolerance.  I told him and his wife that we will part ways.  He is officially discharged from my practice.

## 2014-04-05 LAB — TYPE AND SCREEN
ABO/RH(D): B NEG
Antibody Screen: NEGATIVE
UNIT DIVISION: 0
Unit division: 0

## 2014-04-07 NOTE — ED Provider Notes (Signed)
See prior note   Janice Norrie, MD 04/07/14 1442

## 2014-04-11 IMAGING — CR DG CHEST 1V PORT
1 series · 2 of 2 positions shown · non-contrast
Comparison: 08/19/2013

CLINICAL DATA: Shortness of breath and chest pain

EXAM:
PORTABLE CHEST - 1 VIEW

[Series 1: AP · U · 2 of 2 slices shown]
[im 1/2]
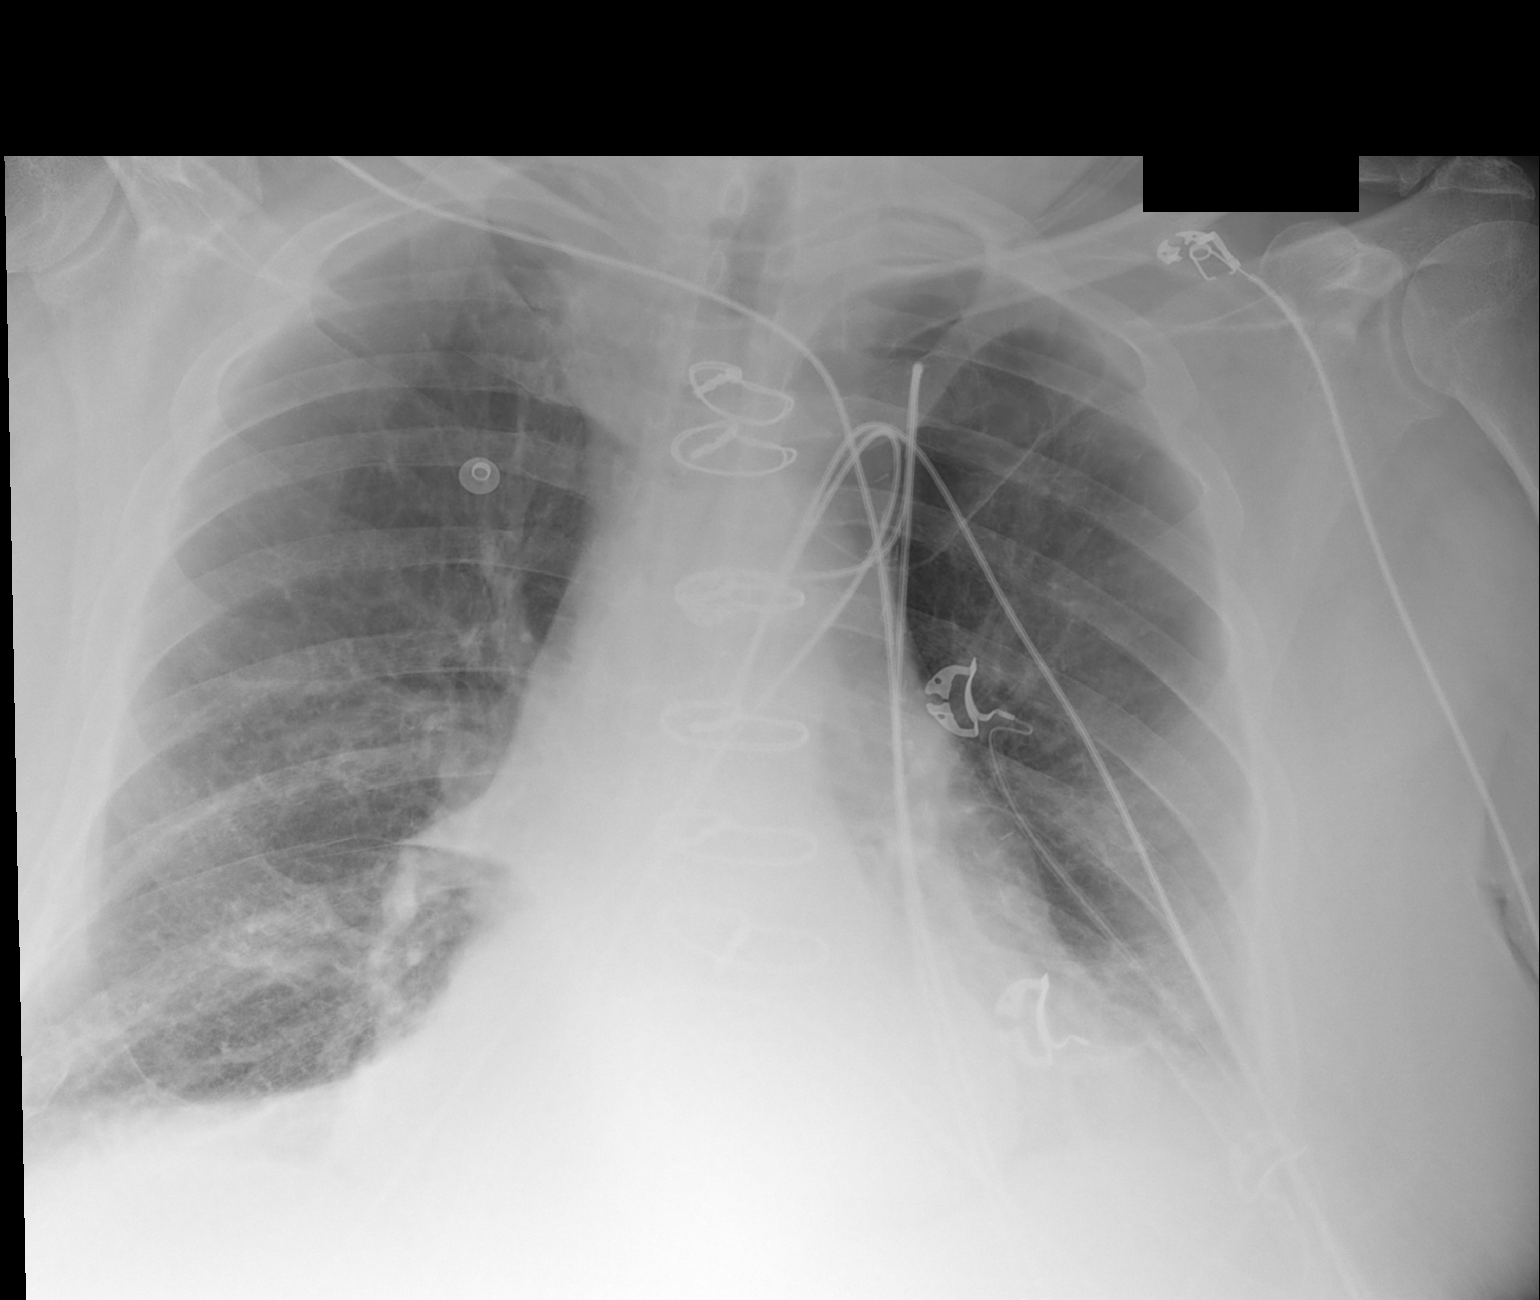
[im 2/2]
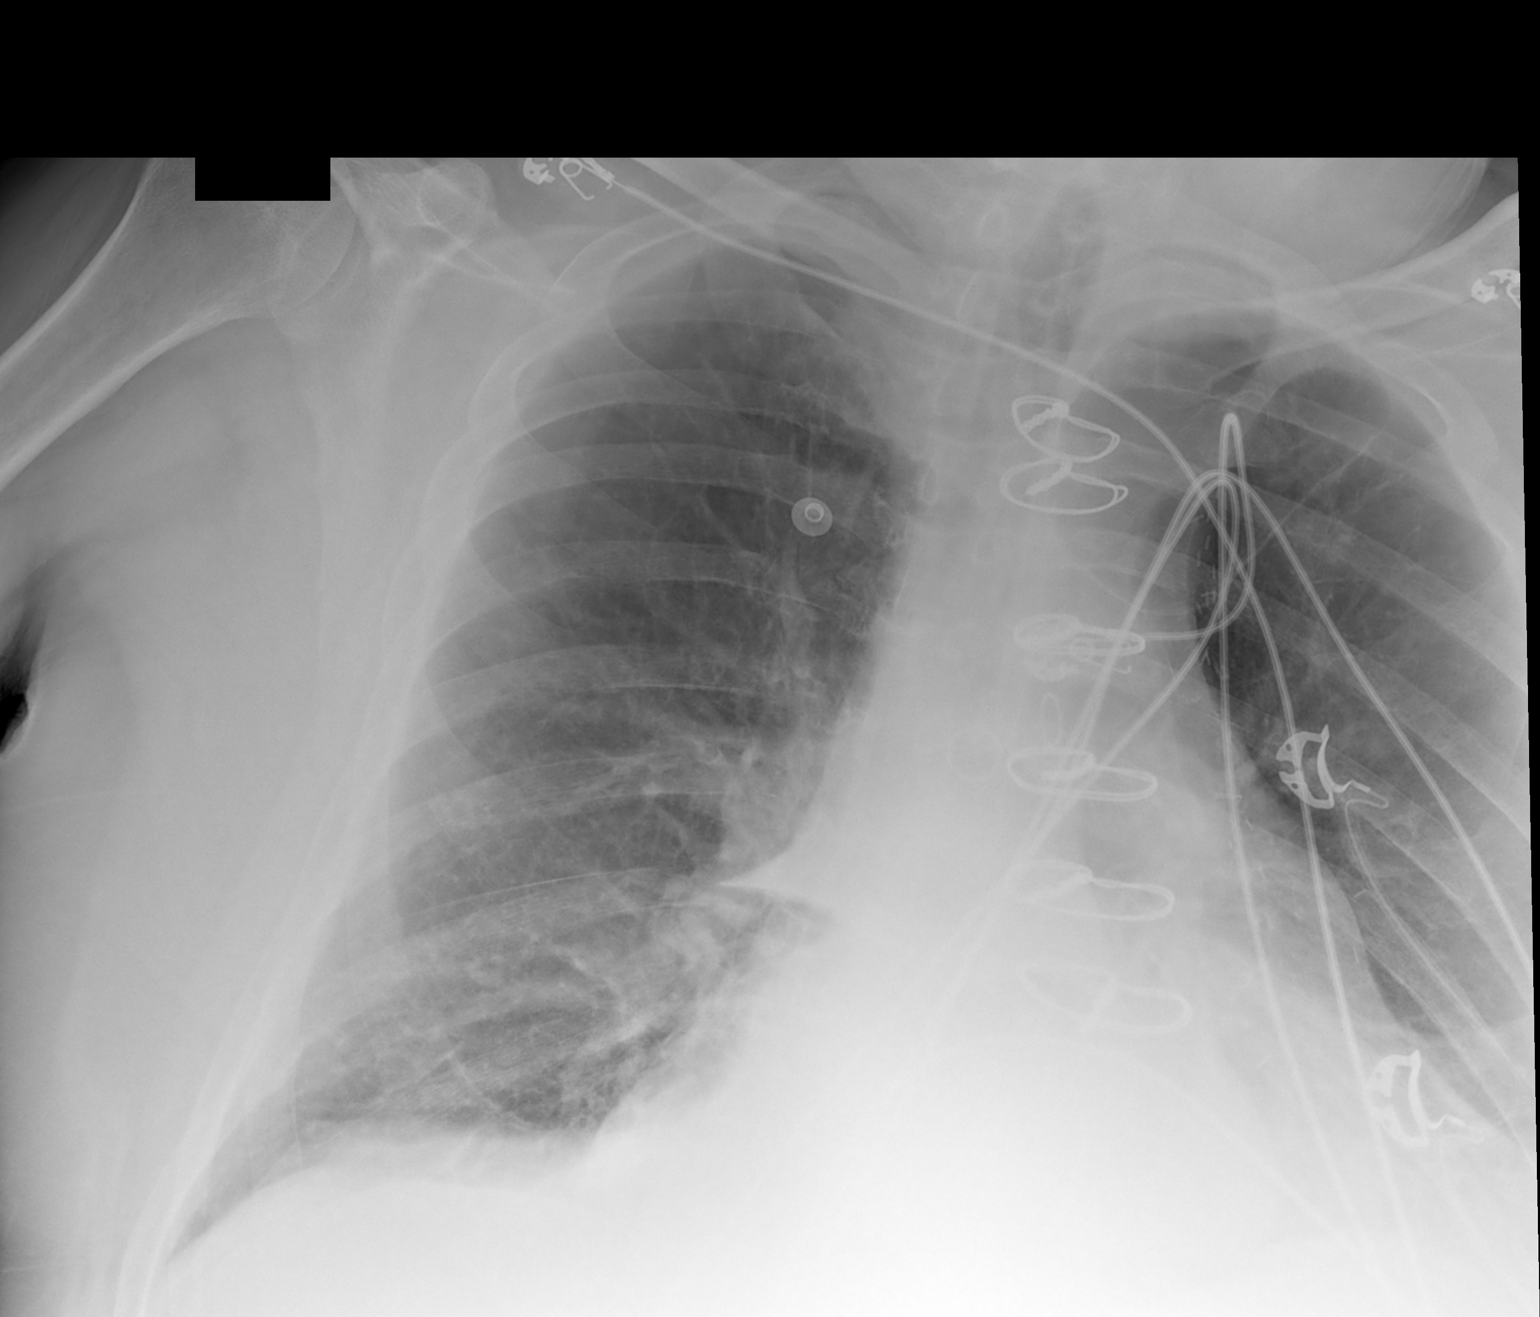

[2 of 2 positions shown; findings below may reference images not displayed]

FINDINGS: Mild cardiac enlargement. Patient is status post median sternotomy.
Vascular pattern is normal. Hyperinflation consistent with COPD with
stable mild bibasilar scarring.
IMPRESSION: Stable chronic findings with no acute process.

## 2014-04-28 ENCOUNTER — Ambulatory Visit: Payer: Self-pay | Admitting: Internal Medicine

## 2014-04-30 ENCOUNTER — Telehealth: Payer: Self-pay | Admitting: *Deleted

## 2014-04-30 ENCOUNTER — Ambulatory Visit (INDEPENDENT_AMBULATORY_CARE_PROVIDER_SITE_OTHER): Payer: Commercial Managed Care - HMO | Admitting: Family Medicine

## 2014-04-30 VITALS — BP 132/70 | HR 85 | Temp 98.5°F | Resp 18 | Ht 73.0 in | Wt 260.8 lb

## 2014-04-30 DIAGNOSIS — G894 Chronic pain syndrome: Secondary | ICD-10-CM

## 2014-04-30 DIAGNOSIS — I714 Abdominal aortic aneurysm, without rupture, unspecified: Secondary | ICD-10-CM

## 2014-04-30 DIAGNOSIS — J449 Chronic obstructive pulmonary disease, unspecified: Secondary | ICD-10-CM

## 2014-04-30 DIAGNOSIS — K31811 Angiodysplasia of stomach and duodenum with bleeding: Secondary | ICD-10-CM

## 2014-04-30 DIAGNOSIS — Q2733 Arteriovenous malformation of digestive system vessel: Secondary | ICD-10-CM

## 2014-04-30 DIAGNOSIS — K5520 Angiodysplasia of colon without hemorrhage: Secondary | ICD-10-CM

## 2014-04-30 DIAGNOSIS — K922 Gastrointestinal hemorrhage, unspecified: Secondary | ICD-10-CM

## 2014-04-30 LAB — CBC WITH DIFFERENTIAL/PLATELET
BASOS ABS: 0 10*3/uL (ref 0.0–0.1)
Basophils Relative: 0 % (ref 0–1)
Eosinophils Absolute: 0.2 10*3/uL (ref 0.0–0.7)
Eosinophils Relative: 2 % (ref 0–5)
HCT: 33 % — ABNORMAL LOW (ref 39.0–52.0)
Hemoglobin: 10.1 g/dL — ABNORMAL LOW (ref 13.0–17.0)
LYMPHS PCT: 8 % — AB (ref 12–46)
Lymphs Abs: 0.7 10*3/uL (ref 0.7–4.0)
MCH: 27.2 pg (ref 26.0–34.0)
MCHC: 30.6 g/dL (ref 30.0–36.0)
MCV: 88.7 fL (ref 78.0–100.0)
Monocytes Absolute: 0.8 10*3/uL (ref 0.1–1.0)
Monocytes Relative: 9 % (ref 3–12)
Neutro Abs: 7.3 10*3/uL (ref 1.7–7.7)
Neutrophils Relative %: 81 % — ABNORMAL HIGH (ref 43–77)
PLATELETS: 296 10*3/uL (ref 150–400)
RBC: 3.72 MIL/uL — AB (ref 4.22–5.81)
RDW: 20.8 % — ABNORMAL HIGH (ref 11.5–15.5)
WBC: 9 10*3/uL (ref 4.0–10.5)

## 2014-04-30 LAB — COMPLETE METABOLIC PANEL WITH GFR
ALBUMIN: 4.1 g/dL (ref 3.5–5.2)
ALK PHOS: 145 U/L — AB (ref 39–117)
ALT: 13 U/L (ref 0–53)
AST: 13 U/L (ref 0–37)
BUN: 11 mg/dL (ref 6–23)
CO2: 25 mEq/L (ref 19–32)
Calcium: 8.6 mg/dL (ref 8.4–10.5)
Chloride: 100 mEq/L (ref 96–112)
Creat: 0.9 mg/dL (ref 0.50–1.35)
GFR, Est African American: >89 mL/min
GFR, Est Non African American: 85 mL/min
Glucose, Bld: 176 mg/dL — ABNORMAL HIGH (ref 70–99)
POTASSIUM: 4.5 meq/L (ref 3.5–5.3)
SODIUM: 140 meq/L (ref 135–145)
TOTAL PROTEIN: 6.8 g/dL (ref 6.0–8.3)
Total Bilirubin: 0.5 mg/dL (ref 0.2–1.2)

## 2014-04-30 LAB — FERRITIN: Ferritin: 20 ng/mL — ABNORMAL LOW (ref 22–322)

## 2014-04-30 LAB — POCT GLYCOSYLATED HEMOGLOBIN (HGB A1C): Hemoglobin A1C: 5.6

## 2014-04-30 MED ORDER — HYDROCODONE-ACETAMINOPHEN 5-325 MG PO TABS: 1.0000 | ORAL_TABLET | Freq: Two times a day (BID) | ORAL | Status: DC | PRN

## 2014-04-30 MED ORDER — ALPRAZOLAM 0.5 MG PO TABS: 0.5000 mg | ORAL_TABLET | Freq: Three times a day (TID) | ORAL | Status: DC | PRN

## 2014-04-30 MED ORDER — CITALOPRAM HYDROBROMIDE 20 MG PO TABS: 20.0000 mg | ORAL_TABLET | Freq: Every day | ORAL | Status: DC

## 2014-04-30 NOTE — Patient Instructions (Signed)
We do not manage chronic pain here. I will refer your to a pain management doctor for your chronic hydrocodone. If you need a refill, you will need to be seen in the office for that.  Also, you will need to be seen for refills of any other controlled medications.  We are NOT accepting new Medicare patients as primary care but you are always welcome to come to our [facility] walk-in clinic for any needs.

## 2014-04-30 NOTE — Telephone Encounter (Signed)
Pt called- says the prescriptions are messed up he received.  He only received a prescription for one month- dated for 8/15 for both Hydrocodone and Xanax. I see where each was written for today and one post dated for 05/31/14. Asked pt to check the paperwork he received and he states there is only one of each dated 8/15.    He also really needs a refill on his Ambien and that was not given to him. Please advise on Ambien.

## 2014-04-30 NOTE — Progress Notes (Addendum)
Subjective:    Patient ID: [name], male    DOB: 10/07/1941, 73 y.o.   MRN: [id number] Chief Complaint  Patient presents with  . Medication Refill    Xanax, Celexa, Hydrocodone  . Referral    asking to referral to [facility] Harold Barban, MD [phone]   HPI Pt is in chronic pain but can't use any NSAIDs or even a baby asa due to chronic GI bleed of unknown etiology. C/O chest, back, leg, hip, arm, joint pain for the last 30d has not had any hydrocodone. Pt was seeing Dr. Benson Norway who last refilled his hydrocodone but now Dr. Benson Norway has discharged him from the practice (or pt fired him - sounds like it was a mutual but disagreeable parting of the ways.) Pt can't tolerate any other type of narcotic pain med other than hydrocodone. Prior pt was getting his hydrocodone from his PCP Dr. Deborra [name] with [facility] - however, he was discharged from them (and banned for all [facility] practices) after he exposed himself in the hallway.  He reports that they were giving him a lot of water to give a urine sample for his UDS to receive his hydrocodone. After a long period, he had to void quit urgently and suddenly (which he attributes to his prostate and age) and he was unable to make it to the restroom so he urinated into a jug in the hallway. This was witnessed by a nurse who was understandably very distraught and offended and he received a discharge letter from the practice the next day.  He is no longer seeing his GI Dr. Benson Norway - they apparently had a rather heated disagreement and pt reports Dr. Benson Norway will no longer see him.  He would like to get another GI doctor due to his recurrent GI bleeding of unknown etiology. Fortunately, he has not had any bleeding episodes for the past month.  However, it will strike quite suddenly and he reports he has required sev prev hospitalizations and transfusions for this.  He describes an in-depth w/u but was then mad when they said they couldn't find the cause and sent  him home.  Thinks wife is getting dementia.  She can't remember how to make a tomato sandwich. States her doctor told her this was normal for her age. He can't talk to her doctor about his concerns, she will get mad at him. His wife is here in the waiting room but refuses to come back as she is mad at him. She has his med list and prepares his med box. He states he has no concerns about how she does this, just about how she can't remember where the doctor's office is. . . .  His wife is giving him his iron and decided to give it to him twice a day - she is giving him an otc iron supp. He does not know what kind or how much.  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 Wants it refilled. Wished he had listened to Dr. Elder Cyphers and quit smoking before he developed this.  He has been on zolpidem 10mg  for many years and 5 mg doesn't work for him.  Has a groin aneurysm and AAA which have been repaired by Dr. Trula Slade and requires yearly monitoring. However, his office reports that they need a new referral due to insurance and pt has not found a  new PCP yet.  Would like to come here.  Was a [employer].  Past Medical History  Diagnosis Date  . Allergic rhinitis, cause unspecified   . Other and unspecified hyperlipidemia   . 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 Unspecified essential hypertension   . 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 Severe bullous disease of LUL. Prolonged intubation after surgeries due to COPD  . 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 Anemia     Recurrent microcytic, presumably GI   . Complication of anesthesia     trouble getting off ventilator  . Memory loss   . Diabetes    Current Outpatient Prescriptions  on File Prior to Visit  Medication Sig Dispense Refill  . atorvastatin (LIPITOR) 40 MG tablet Take 1 tablet (40 mg total) by mouth at bedtime.  90 tablet  11  . budesonide-formoterol (SYMBICORT) 160-4.5 MCG/ACT inhaler Inhale 2 puffs into the lungs 2 (two) times daily.      Marland Kitchen albuterol (PROVENTIL HFA;VENTOLIN HFA) 108 (90 BASE) MCG/ACT inhaler Inhale 2 puffs into the lungs every 6 (six) hours as needed for wheezing.      . clobetasol ointment (TEMOVATE) 8.75 % Apply 1 application topically 2 (two) times daily as needed (rash).       . folic acid (FOLVITE) 1 MG tablet Take 2 tablets (2 mg total) by mouth every morning.  60 tablet  11  . iron polysaccharides (NIFEREX) 150 MG capsule Take 150 mg by mouth 2 (two) times daily.      . metFORMIN (GLUCOPHAGE) 500 MG tablet Take 500 mg by mouth 2 (two) times daily with a meal.      . metoprolol tartrate (LOPRESSOR) 25 MG tablet Take 1 tablet (25 mg total) by mouth 2 (two) times daily.  60 tablet  11  . pantoprazole (PROTONIX) 40 MG tablet Take 40 mg by mouth 2 (two) times daily.      Marland Kitchen tiotropium (SPIRIVA) 18 MCG inhalation capsule Place 18 mcg into inhaler and inhale daily.       No current facility-administered medications on file prior to visit.   Allergies  Allergen Reactions  . Penicillins Anaphylaxis and Hives  . Demerol [Meperidine] Other (See Comments)    hallucinations  . Dilaudid [Hydromorphone Hcl] Other (See Comments)    hallucinations  . Levaquin [Levofloxacin In D5w]     Nausea and diarrhea  . Morphine And Related Nausea Only and Other (See Comments)    hallucinations    Review of Systems  Constitutional: Positive for fatigue. Negative for fever, chills, activity change, appetite change and unexpected weight change.  Respiratory: Positive for cough and shortness of breath.   Gastrointestinal: Positive for abdominal pain and blood in stool.  Genitourinary: Positive for difficulty urinating.  Musculoskeletal: Positive for  arthralgias, back pain, gait problem, joint swelling, myalgias, neck pain and neck stiffness.  Neurological: Positive for weakness.  Psychiatric/Behavioral: Positive for sleep disturbance. Negative for behavioral problems and confusion. The patient is nervous/anxious.       BP 132/70  Pulse 85  Temp(Src) 98.5 F (36.9 C) (Oral)  Resp 18  Ht 6\' 1"  (1.854 m)  Wt 260 lb 12.8 oz (118.298 kg)  BMI 34.42 kg/m2  SpO2 96% Objective:   Physical Exam  Constitutional: He is oriented to person,  place, and time. He appears well-developed and well-nourished. No distress.  On home O2 by [place]  HENT:  Head: Normocephalic and atraumatic.  Eyes: No scleral icterus.  Pulmonary/Chest: Effort normal.  Neurological: He is alert and oriented to person, place, and time.  Skin: Skin is warm and dry. He is not diaphoretic.  Psychiatric: His speech is normal. Cognition and memory are normal. He expresses impulsivity and inappropriate judgment.      Results for orders placed in visit on 04/30/14  CBC WITH DIFFERENTIAL      Result Value Ref Range   WBC 9.0  4.0 - 10.5 K/uL   RBC 3.72 (*) 4.22 - 5.81 MIL/uL   Hemoglobin 10.1 (*) 13.0 - 17.0 g/dL   HCT 33.0 (*) 39.0 - 52.0 %   MCV 88.7  78.0 - 100.0 fL   MCH 27.2  26.0 - 34.0 pg   MCHC 30.6  30.0 - 36.0 g/dL   RDW 20.8 (*) 11.5 - 15.5 %   Platelets 296  150 - 400 K/uL   Neutrophils Relative % 81 (*) 43 - 77 %   Neutro Abs 7.3  1.7 - 7.7 K/uL   Lymphocytes Relative 8 (*) 12 - 46 %   Lymphs Abs 0.7  0.7 - 4.0 K/uL   Monocytes Relative 9  3 - 12 %   Monocytes Absolute 0.8  0.1 - 1.0 K/uL   Eosinophils Relative 2  0 - 5 %   Eosinophils Absolute 0.2  0.0 - 0.7 K/uL   Basophils Relative 0  0 - 1 %   Basophils Absolute 0.0  0.0 - 0.1 K/uL   Smear Review SEE NOTE    FERRITIN      Result Value Ref Range   Ferritin 20 (*) 22 - 322 ng/mL  COMPLETE METABOLIC PANEL WITH GFR      Result Value Ref Range   Sodium 140  135 - 145 mEq/L   Potassium 4.5  3.5 -  5.3 mEq/L   Chloride 100  96 - 112 mEq/L   CO2 25  19 - 32 mEq/L   Glucose, Bld 176 (*) 70 - 99 mg/dL   BUN 11  6 - 23 mg/dL   Creat 0.90  0.50 - 1.35 mg/dL   Total Bilirubin 0.5  0.2 - 1.2 mg/dL   Alkaline Phosphatase 145 (*) 39 - 117 U/L   AST 13  0 - 37 U/L   ALT 13  0 - 53 U/L   Total Protein 6.8  6.0 - 8.3 g/dL   Albumin 4.1  3.5 - 5.2 g/dL   Calcium 8.6  8.4 - 10.5 mg/dL   GFR, Est African American >89     GFR, Est Non African American 85    POCT GLYCOSYLATED HEMOGLOBIN (HGB A1C)      Result Value Ref Range   Hemoglobin A1C 5.6         Assessment & Plan:   AAA (abdominal aortic aneurysm) - Plan: Ambulatory referral to Vascular Surgery, Ferritin, COMPLETE METABOLIC PANEL WITH GFR, POCT glycosylated hemoglobin (Hb A1C)  AVM (arteriovenous malformation) of colon - Plan: Ambulatory referral to Vascular Surgery, Ferritin, COMPLETE METABOLIC PANEL WITH GFR, POCT glycosylated hemoglobin (Hb A1C)  AVM (arteriovenous malformation) of stomach, acquired with hemorrhage - Plan: Ambulatory referral to Vascular Surgery, Ferritin, COMPLETE METABOLIC PANEL WITH GFR, POCT glycosylated hemoglobin (Hb A1C)  Gastrointestinal hemorrhage, unspecified gastritis, unspecified gastrointestinal hemorrhage type - Plan: CBC with Differential, Ferritin, COMPLETE METABOLIC PANEL WITH GFR, POCT  glycosylated hemoglobin (Hb A1C), Ambulatory referral to Gastroenterology  Chronic GI bleeding - Plan: CBC with Differential, Ferritin, COMPLETE METABOLIC PANEL WITH GFR, POCT glycosylated hemoglobin (Hb A1C), Ambulatory referral to Gastroenterology - reviewed with pt that I suspect he will have a difficult time getting a GI doctor in [place] to see him as an o/p consdering he has been discharged by Dr. Benson Norway and Velora Heckler - can try [facility] and ask referrals to see if there are any other GI docs in town but if not will have to refer to [facility] or other.  Chronic pain syndrome - Plan: Ambulatory referral to Pain  Clinic - 2  Mos of refills of hydrocodone provided to pt after review of [facility] - however, i will not provide pt's chronic narcotics so refer to pain management for this. If he needs a refill in 2 mos and has not been seen by pain management yet, will need to be seen in office for this.  Chronic obstructive pulmonary disease, unspecified COPD, unspecified chronic bronchitis type - pt ran out of oxygen in his 2 portable tanks in our clinic. Put on our tank and recommended to call [employer] to bring him a replacement to the office for his trip home but pt refused and left AMA to go home w/o oxygen (has tanks at home)   DM - pt on bid metformin 500 w/ a1c today of 5.6 - last a1c was 11/14 and 6.5 - prior to that was around 7.0.  Ok to decrease metformin to qam if pt prefers - if he does this, recheck a1c in 3 mos and cons stopping metformin if a1c remains low.  Meds ordered this encounter  Medications  . citalopram (CELEXA) 20 MG tablet    Sig: Take 1 tablet (20 mg total) by mouth daily.    Dispense:  30 tablet    Refill:  5  . DISCONTD: HYDROcodone-acetaminophen (NORCO/VICODIN) 5-325 MG per tablet    Sig: Take 1 tablet by mouth every 12 (twelve) hours as needed for moderate pain.    Dispense:  60 tablet    Refill:  0  . DISCONTD: ALPRAZolam (XANAX) 0.5 MG tablet    Sig: Take 1 tablet (0.5 mg total) by mouth 3 (three) times daily as needed for anxiety (for shortness of breath).    Dispense:  20 tablet    Refill:  0  . ALPRAZolam (XANAX) 0.5 MG tablet    Sig: Take 1 tablet (0.5 mg total) by mouth 3 (three) times daily as needed for anxiety (for shortness of breath). May fill on or after 05/31/14    Dispense:  20 tablet    Refill:  0  .  HYDROcodone-acetaminophen (NORCO/VICODIN) 5-325 MG per tablet    Sig: Take 1 tablet by mouth every 12 (twelve) hours as needed for moderate pain. May fill on or after 05/31/14    Dispense:  60 tablet    Refill:  0   INFORMED PT THAT WE ARE NOT ACCEPTING NEW  MEDICARE PT'S AS THEIR PCP BUT HE CAN ALWAYS BE SEEN AT OUR [facility].  I WILL NOT BE THIS PT'S PCP - HE WAS DEMANDING, CONFRONTATIONAL, AND VERBALLY AGGRESSIVE IN THE OFFICE TOWARDS ME.  HOWEVER, I UNDERSTAND HE NEEDS MEDICAL CARE AND SO AM WILLING TO SEE HIM IN OUR [facility] ON AN AS NEEDED BASIS.  Delman Cheadle, MD MPH

## 2014-05-01 ENCOUNTER — Encounter: Payer: Self-pay | Admitting: Family Medicine

## 2014-05-01 MED ORDER — HYDROCODONE-ACETAMINOPHEN 5-325 MG PO TABS: 1.0000 | ORAL_TABLET | Freq: Two times a day (BID) | ORAL | Status: DC | PRN

## 2014-05-01 MED ORDER — ZOLPIDEM TARTRATE 10 MG PO TABS: 10.0000 mg | ORAL_TABLET | Freq: Every evening | ORAL | Status: DC | PRN

## 2014-05-01 MED ORDER — ALPRAZOLAM 0.5 MG PO TABS: 0.5000 mg | ORAL_TABLET | Freq: Three times a day (TID) | ORAL | Status: DC | PRN

## 2014-05-01 NOTE — Telephone Encounter (Signed)
I printed 2 rx for his hydrocone and xanax - 1 for today and 1 for next mo. They are on different sheets of paper. Luellen Pucker gave both to him. If he has lost the 1 for today I will reprint it and he can pick it up but caution that if he finds the original he needs to destroy it - if he fills it, we will no longer rx him any controlled substances. The Lorrin Mais was not on his med list and he did not ask for a refill of it in the office.  He stated that he was had been taking ambien for a long time but did not put it on his med list nor request refill so I assumed he still had refills on it from his prior doctor.  Still, provided refill now that he can pick up Strongly recommend that if pt comes back, brings his medication bottles with him so that we can ensure he has everything he needs. Also recommend that he request to switch to our oxygen tank as soon as he gets to clinic if he comes back.

## 2014-05-01 NOTE — Telephone Encounter (Signed)
Spoke to pts wife, she is aware rx is ready for p/u

## 2014-05-02 ENCOUNTER — Encounter: Payer: Self-pay | Admitting: Family

## 2014-05-02 ENCOUNTER — Telehealth: Payer: Self-pay

## 2014-05-02 NOTE — Telephone Encounter (Signed)
Please call pt when authorization for medication has been sent into insurance company

## 2014-05-02 NOTE — Telephone Encounter (Signed)
Received fax from [facility] for prior authorization. Sent to plan in covermymeds- prior auth pending

## 2014-05-05 ENCOUNTER — Ambulatory Visit: Payer: Medicare PPO | Admitting: Family

## 2014-05-05 ENCOUNTER — Ambulatory Visit (INDEPENDENT_AMBULATORY_CARE_PROVIDER_SITE_OTHER): Payer: Commercial Managed Care - HMO | Admitting: Family

## 2014-05-05 ENCOUNTER — Other Ambulatory Visit (HOSPITAL_COMMUNITY): Payer: Medicare PPO

## 2014-05-05 ENCOUNTER — Ambulatory Visit (HOSPITAL_COMMUNITY)
Admission: RE | Admit: 2014-05-05 | Discharge: 2014-05-05 | Disposition: A | Discharge status: Home / Self Care | Payer: Medicare HMO | Source: Ambulatory Visit | Attending: Family | Admitting: Family

## 2014-05-05 ENCOUNTER — Encounter: Payer: Self-pay | Admitting: Family

## 2014-05-05 VITALS — BP 129/81 | HR 100 | Resp 18 | Ht 73.0 in | Wt 259.0 lb

## 2014-05-05 DIAGNOSIS — I714 Abdominal aortic aneurysm, without rupture, unspecified: Secondary | ICD-10-CM

## 2014-05-05 DIAGNOSIS — Z48812 Encounter for surgical aftercare following surgery on the circulatory system: Secondary | ICD-10-CM
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
## 2014-05-05 NOTE — Patient Instructions (Signed)
Abdominal Aortic Aneurysm An aneurysm is a weakened or damaged part of an artery wall that bulges from the normal force of blood pumping through the body. An abdominal aortic aneurysm is an aneurysm that occurs in the lower part of the aorta, the main artery of the body.  The major concern with an abdominal aortic aneurysm is that it can enlarge and burst (rupture) or blood can flow between the layers of the wall of the aorta through a tear (aorticdissection). Both of these conditions can cause bleeding inside the body and can be life threatening unless diagnosed and treated promptly. CAUSES  The exact cause of an abdominal aortic aneurysm is unknown. Some contributing factors are:   A hardening of the arteries caused by the buildup of fat and other substances in the lining of a blood vessel (arteriosclerosis).  Inflammation of the walls of an artery (arteritis).   Connective tissue diseases, such as Marfan syndrome.   Abdominal trauma.   An infection, such as syphilis or staphylococcus, in the wall of the aorta (infectious aortitis) caused by bacteria. RISK FACTORS  Risk factors that contribute to an abdominal aortic aneurysm may include:  Age older than 60 years.   High blood pressure (hypertension).  Male gender.  Ethnicity (white race).  Obesity.  Family history of aneurysm (first degree relatives only).  Tobacco use. PREVENTION  The following healthy lifestyle habits may help decrease your risk of abdominal aortic aneurysm:  Quitting smoking. Smoking can raise your blood pressure and cause arteriosclerosis.  Limiting or avoiding alcohol.  Keeping your blood pressure, blood sugar level, and cholesterol levels within normal limits.  Decreasing your salt intake. In somepeople, too much salt can raise blood pressure and increase your risk of abdominal aortic aneurysm.  Eating a diet low in saturated fats and cholesterol.  Increasing your fiber intake by including  whole grains, vegetables, and fruits in your diet. Eating these foods may help lower blood pressure.  Maintaining a healthy weight.  Staying physically active and exercising regularly. SYMPTOMS  The symptoms of abdominal aortic aneurysm may vary depending on the size and rate of growth of the aneurysm.Most grow slowly and do not have any symptoms. When symptoms do occur, they may include:  Pain (abdomen, side, lower back, or groin). The pain may vary in intensity. A sudden onset of severe pain may indicate that the aneurysm has ruptured.  Feeling full after eating only small amounts of food.  Nausea or vomiting or both.  Feeling a pulsating lump in the abdomen.  Feeling faint or passing out. DIAGNOSIS  Since most unruptured abdominal aortic aneurysms have no symptoms, they are often discovered during diagnostic exams for other conditions. An aneurysm may be found during the following procedures:  Ultrasonography (A one-time screening for abdominal aortic aneurysm by ultrasonography is also recommended for all men aged 65-75 years who have ever smoked).  X-ray exams.  A computed tomography (CT).  Magnetic resonance imaging (MRI).  Angiography or arteriography. TREATMENT  Treatment of an abdominal aortic aneurysm depends on the size of your aneurysm, your age, and risk factors for rupture. Medication to control blood pressure and pain may be used to manage aneurysms smaller than 6 cm. Regular monitoring for enlargement may be recommended by your caregiver if:  The aneurysm is 3-4 cm in size (an annual ultrasonography may be recommended).  The aneurysm is 4-4.5 cm in size (an ultrasonography every 6 months may be recommended).  The aneurysm is larger than 4.5 cm in   size (your caregiver may ask that you be examined by a vascular surgeon). If your aneurysm is larger than 6 cm, surgical repair may be recommended. There are two main methods for repair of an aneurysm:   Endovascular  repair (a minimally invasive surgery). This is done most often.  Open repair. This method is used if an endovascular repair is not possible. Document Released: 07/13/2005 Document Revised: 01/28/2013 Document Reviewed: 11/02/2012 Exit[employer] Patient Information 2015 [employer]. This information is not intended to replace advice given to you by your health care provider. Make sure you discuss any questions you have with your health care provider.  

## 2014-05-06 NOTE — Telephone Encounter (Signed)
PA approved through 05/05/15. Notified pt and pharmacy.

## 2014-05-16 ENCOUNTER — Encounter: Payer: Self-pay | Admitting: Surgery

## 2014-05-19 ENCOUNTER — Ambulatory Visit (INDEPENDENT_AMBULATORY_CARE_PROVIDER_SITE_OTHER): Payer: Medicare HMO | Admitting: Surgery

## 2014-05-19 ENCOUNTER — Encounter: Payer: Self-pay | Admitting: Surgery

## 2014-05-19 ENCOUNTER — Ambulatory Visit
Admission: RE | Admit: 2014-05-19 | Discharge: 2014-05-19 | Disposition: A | Discharge status: Home / Self Care | Payer: Commercial Managed Care - HMO | Source: Ambulatory Visit | Attending: Family | Admitting: Family

## 2014-05-19 VITALS — BP 154/76 | HR 89 | Ht 73.0 in | Wt 258.0 lb

## 2014-05-19 DIAGNOSIS — I714 Abdominal aortic aneurysm, without rupture, unspecified: Secondary | ICD-10-CM

## 2014-05-19 DIAGNOSIS — Z48812 Encounter for surgical aftercare following surgery on the circulatory system: Secondary | ICD-10-CM

## 2014-05-19 IMAGING — CR DG CHEST 2V
2 series · 2 of 2 positions shown · non-contrast
Comparison: 09/07/2013

CLINICAL DATA: Shortness of Breath

EXAM:
CHEST  2 VIEW

[w chest lat]
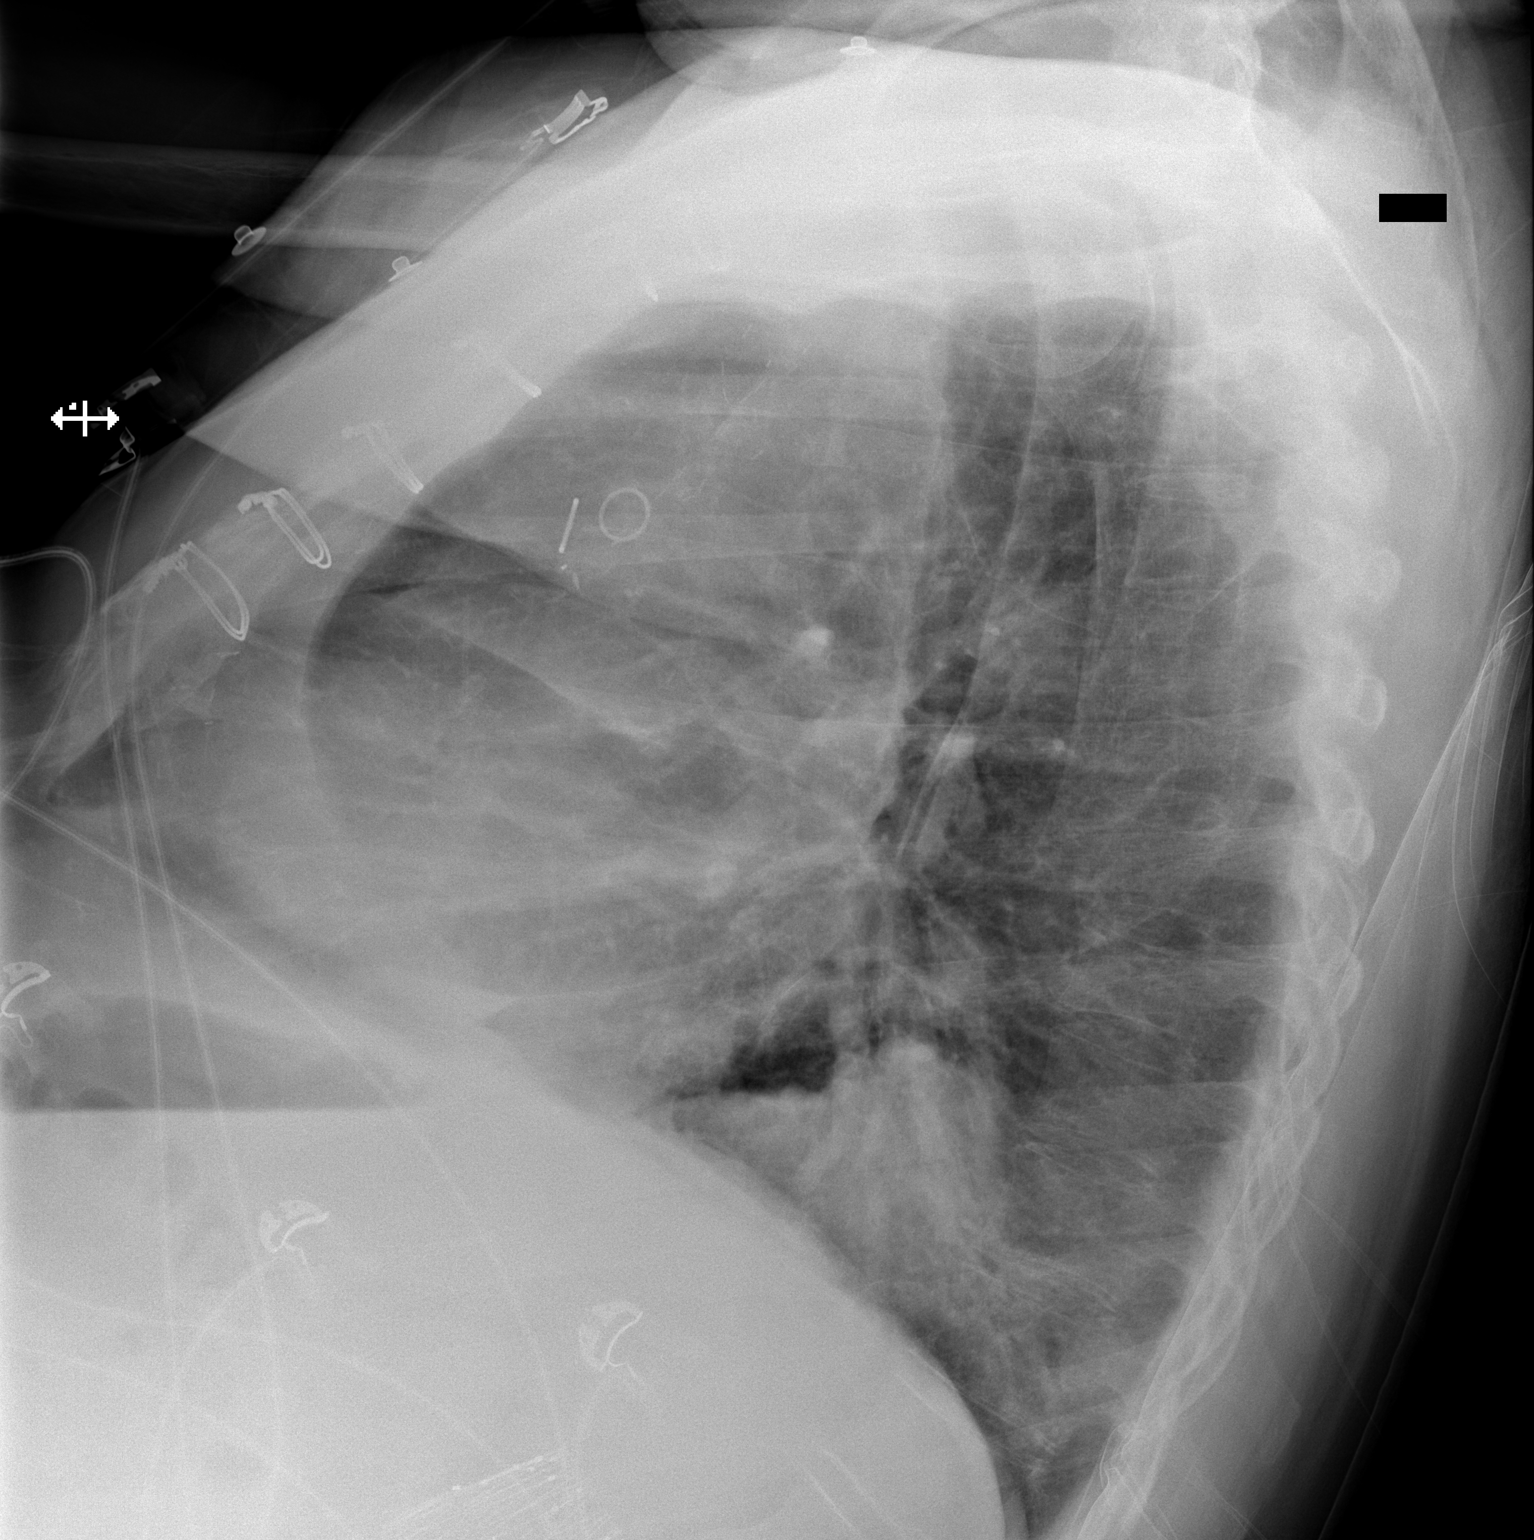

[x chest ap]
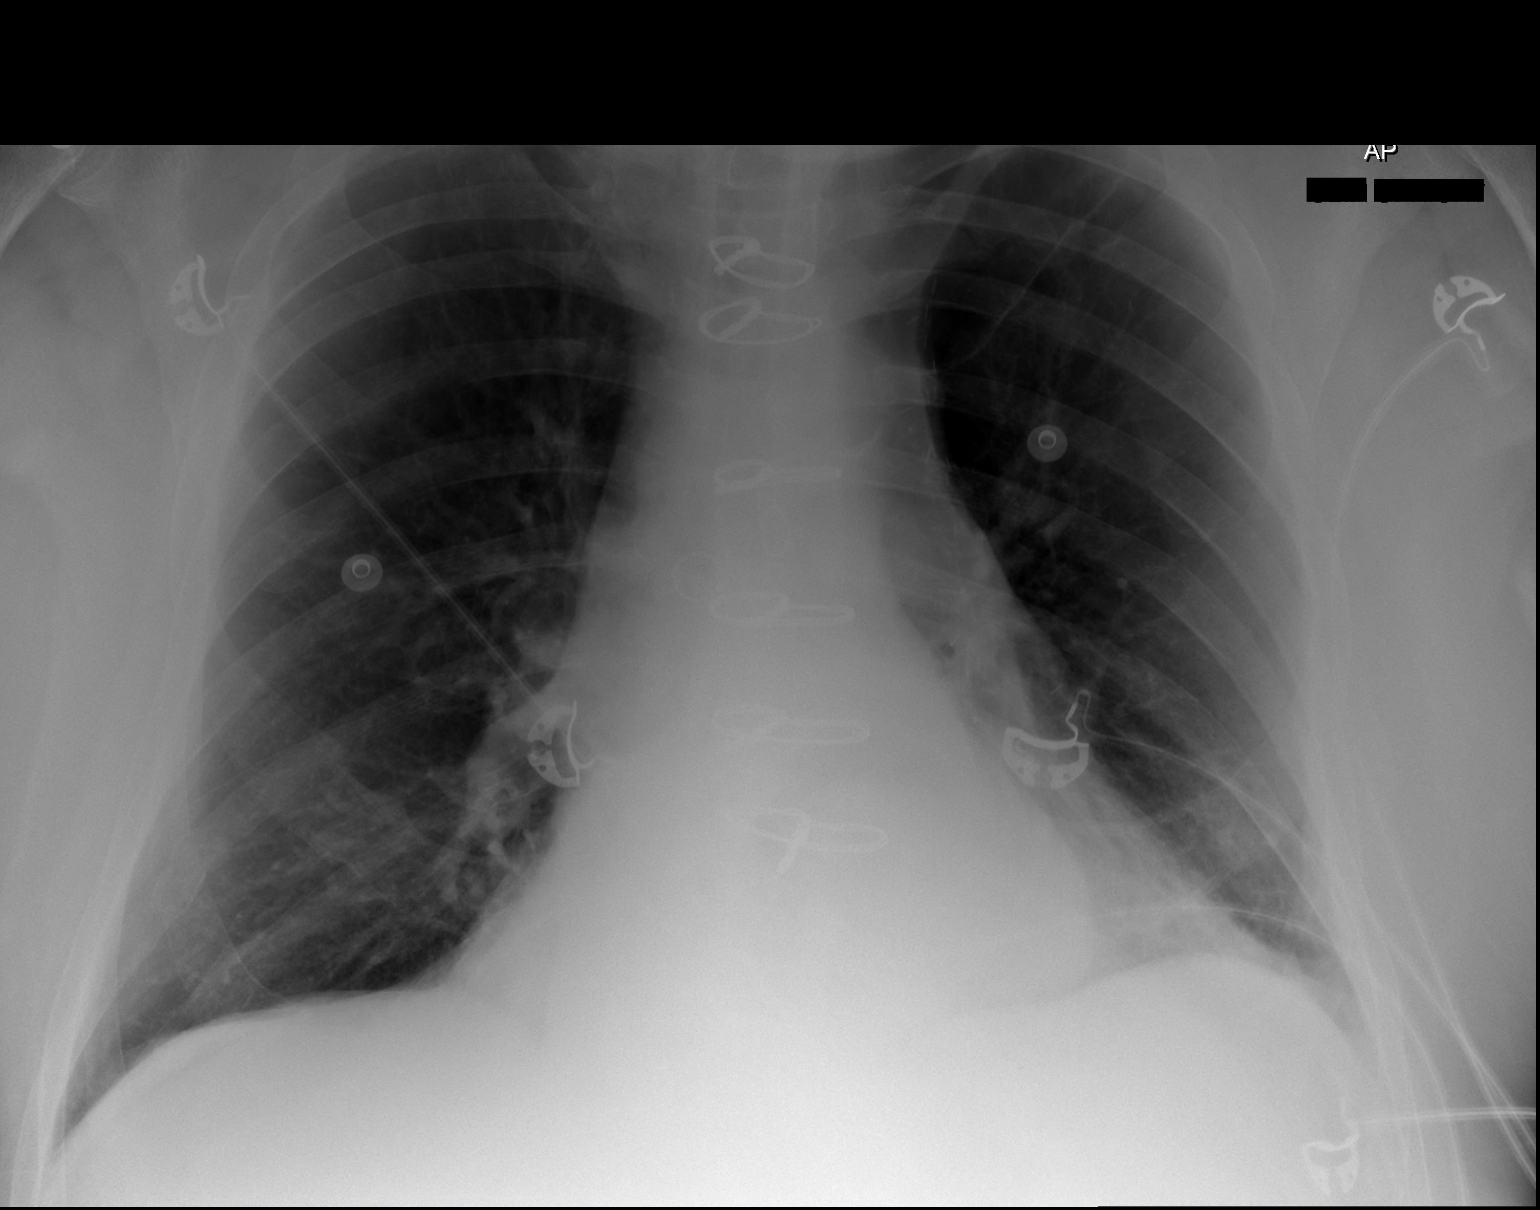

[2 of 2 positions shown; findings below may reference images not displayed]

FINDINGS: Cardiomediastinal silhouette is stable. Status post CABG.
Hyperinflation again noted. Moderate size hiatal hernia. Mild left
basilar atelectasis or scarring. No acute infiltrate or pulmonary
edema. Stable bullous changes left apex.
IMPRESSION: Hyperinflation again noted. Moderate size hiatal hernia. Mild left
basilar atelectasis or scarring. No acute infiltrate or pulmonary
edema. Stable bullous changes left apex.

## 2014-05-19 MED ORDER — IOHEXOL 350 MG/ML SOLN
80.0000 mL | Freq: Once | INTRAVENOUS | Status: AC | PRN
  Administered 2014-05-19: 80 mL via INTRAVENOUS
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
## 2014-05-19 NOTE — Addendum Note (Signed)
Addended by: Mena Goes on: 05/19/2014 04:28 PM   Modules accepted: Orders

## 2014-05-30 ENCOUNTER — Other Ambulatory Visit: Payer: Self-pay | Admitting: *Deleted

## 2014-05-30 MED ORDER — TIOTROPIUM BROMIDE MONOHYDRATE 18 MCG IN CAPS: 18.0000 ug | ORAL_CAPSULE | Freq: Every day | RESPIRATORY_TRACT | Status: DC

## 2014-06-05 ENCOUNTER — Other Ambulatory Visit: Payer: Self-pay | Admitting: Internal Medicine

## 2014-06-07 NOTE — Telephone Encounter (Signed)
Patient wants Dr. Brigitte Pulse to call him Monday morning about his Xanax prescription. He is upset that his request has not been approved yet. I informed patient that she will be in the office Monday at 5pm and if he is still having trouble breathing due to panic attacks he can come to the office to be seen. Patient states he lives too far and wants Dr. Brigitte Pulse to call him Monday morning.

## 2014-06-07 NOTE — Telephone Encounter (Signed)
The patient called about his prescription for Xanax.  I looked in his Encounters list when I was on the phone with him, but did not see a recent note from [facility] in his history.  After hanging up, when I went to add the telephone message, I noticed that a note came up from two days ago, 06/05/14.  The notes from 06/05/14 say that he needs to see the doctor before it can be refilled.  If someone could please call him and relay that message to him.  Sorry for the confusion.  Thank you.  CB#: [phone]

## 2014-06-11 ENCOUNTER — Telehealth: Payer: Self-pay

## 2014-06-11 MED ORDER — ALPRAZOLAM 0.5 MG PO TABS: 0.5000 mg | ORAL_TABLET | Freq: Three times a day (TID) | ORAL | Status: DC | PRN

## 2014-06-11 NOTE — Telephone Encounter (Signed)
See phone messages from this patient. I've authorized #[id number]. Please advise when you would like him to follow-up, and the plan for future refills of this med.

## 2014-06-11 NOTE — Telephone Encounter (Signed)
Faxed Rx to pharm after verifying w/pt. Notified pt RF sent and we will let him know after Dr Brigitte Pulse tells [place] when she needs to see him back.

## 2014-06-11 NOTE — Telephone Encounter (Signed)
Patient called back upset about his refill on his "Xanax". Patient states he has been out for several days and has had panic attacks and difficulty breathing. Patient states he needs this medication to calm him down. Per patient he has called several times and the pharmacy has sent several request for refill. Patient states his wife came yesterday and try to figure out what was taking so long and was told it was still being processed. I informed patient that on one of the messages seem that he may need an appointment to come in to see Dr. Brigitte Pulse. Patient became more upset when i told him that. Patient stated "I live too far and it is crazy for me To have to come in to be seen for the doctor to tell me I am alive. Please call patient at [phone]  Message was also sent on previous message for 06/05/14 sent today at 9:10 am

## 2014-06-11 NOTE — Telephone Encounter (Signed)
Patient called back upset about his refill on his "Xanax". Patient states he has been out for several days and has had panic attacks and difficulty breathing. Patient states he needs this medication to calm him down. Per patient he has called several times and the pharmacy has sent several request for refill. Patient states his wife came yesterday and try to figure out what was taking so long and was told it was still being processed. I informed patient that on one of the messages seem that he may need an appointment to come in to see Dr. Brigitte Pulse. Patient became more upset when i told him that. Patient stated  "I live too far and it is crazy for me  To have to  come in to be seen for the doctor to tell me I am alive. Please call patient at [phone]

## 2014-06-12 NOTE — Telephone Encounter (Signed)
Lm for rtn call to discuss controlled substance policy with pt per Dr. Brigitte Pulse message.

## 2014-06-12 NOTE — Telephone Encounter (Signed)
Spoke to pt- he understands the controlled substance policy and was transferred to make an appt with Dr. Brigitte Pulse. Pt was very polite and receptive to our policy about these medications.

## 2014-06-12 NOTE — Telephone Encounter (Signed)
I am NOT this pt's PCP - I just saw him once and provided a courtesy refill of his xanax - a gave him a 2 mo supply just 1 mo previously so pt has been using much more than anticipated. This was started by his pulmonologist so he can see them for refills or he can come in at any time to see another provider at [facility] for refills.  My understanding per his chart was that he had only been recently started on xanax so I did not anticipate him using it in such quantity. At pt's OV I explained to pt that I do require OV for controlled medicine refills which includes xanax. I'm sorry that that is inconvenient for him but he is welcome to see another doctor if that does not work for him.

## 2014-06-19 NOTE — Telephone Encounter (Signed)
See other phone message  

## 2014-06-20 ENCOUNTER — Other Ambulatory Visit: Payer: Self-pay | Admitting: Emergency Medicine

## 2014-06-20 MED ORDER — BUDESONIDE-FORMOTEROL FUMARATE 160-4.5 MCG/ACT IN AERO
2.0000 | INHALATION_SPRAY | Freq: Two times a day (BID) | RESPIRATORY_TRACT | Status: DC
Start: 2014-06-20 — End: 2014-08-05

## 2014-06-30 ENCOUNTER — Other Ambulatory Visit: Payer: Self-pay | Admitting: Family Medicine

## 2014-07-01 NOTE — Telephone Encounter (Signed)
Pt has appt sch w/you for 07/11/14.

## 2014-07-01 NOTE — Telephone Encounter (Signed)
This patient should NOT have been allowed to schedule an appointment. We are not accepting new Medicare patients - being seen once at our [facility] urgent walk-in clinic does not make you an established pt here. If he needs a medication refill, he is welcome to see a provider at [facility] - I am NOT his PCP so can be seen by anyone.

## 2014-07-02 NOTE — Telephone Encounter (Signed)
ambien refilled x 1 only

## 2014-07-02 NOTE — Telephone Encounter (Signed)
Called in RF. Discussed problem w/appt w/Katina who asked me to forward this to her. Alwyn Ren, please see 06/11/14 ph mes, 04/30/14 OV notes, and 12/13/13 ph mes from Dr Tanja Port Aron's office [place].

## 2014-07-08 IMAGING — CT CT ANGIO ABDOMEN
2 of 3 series · 14 of 32 positions shown, 19 images · IV contrast ([ID] [id number])
Comparison: CT ANGIO ABDOMEN W/CM &/OR WO/CM dated 12/22/2009

CLINICAL DATA: Abdominal pain.  History of aneurysm.

EXAM:
CT ANGIOGRAPHY ABDOMEN
TECHNIQUE: Multidetector CT imaging of the abdomen was performed using the
standard protocol during bolus administration of intravenous
contrast. Multiplanar reconstructed images including MIPs were
obtained and reviewed to evaluate the vascular anatomy.
CONTRAST:  100mL OMNIPAQUE IOHEXOL 350 MG/ML SOLN

[Series 5: arterial 3.0 b30f · axial · arterial · 0.83mm/px · z∈[+1215,+1428]mm · 6 of 118 slices shown]
[im 8/118  soft-tissue]
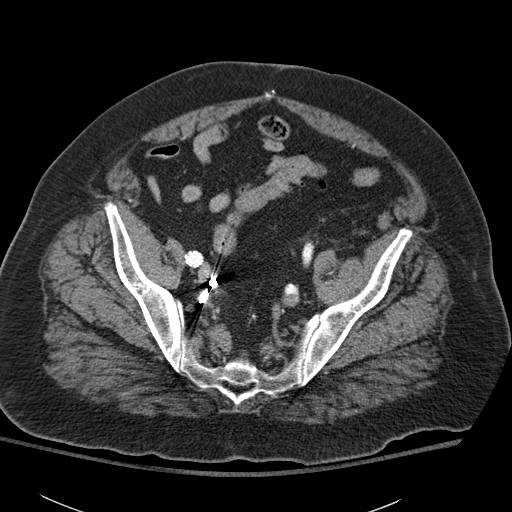
[im 24/118  soft-tissue]
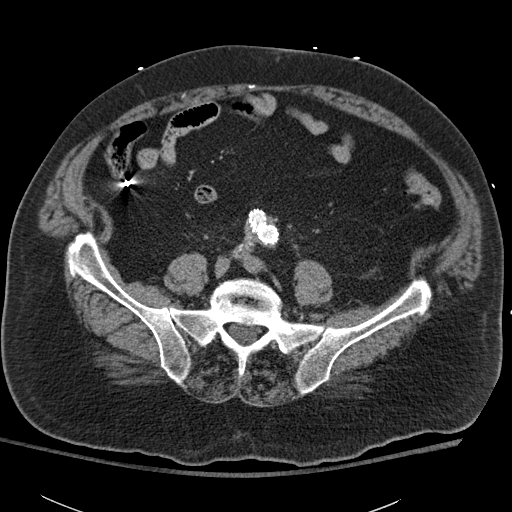
[im 40/118  soft-tissue]
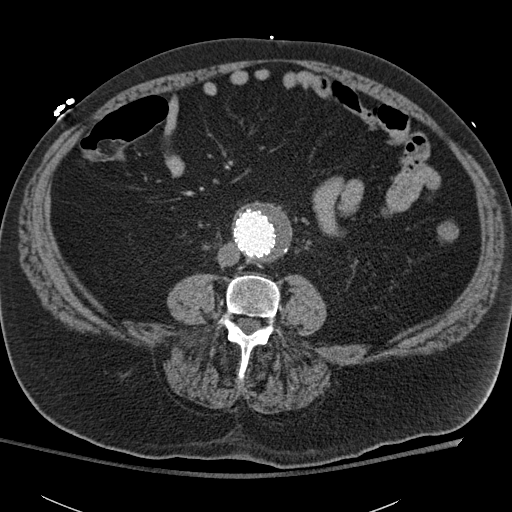
[im 55/118  soft-tissue]
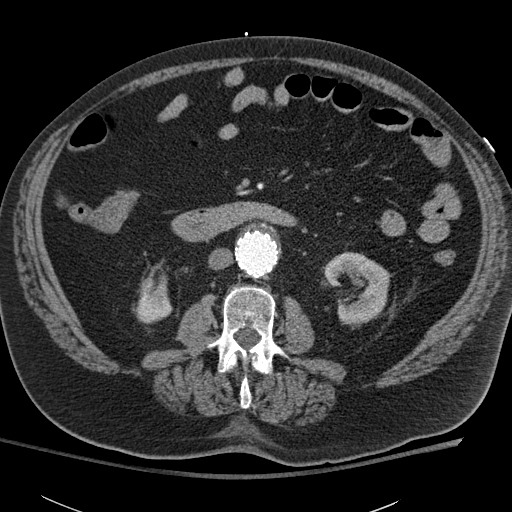
[im 63/118  soft-tissue]
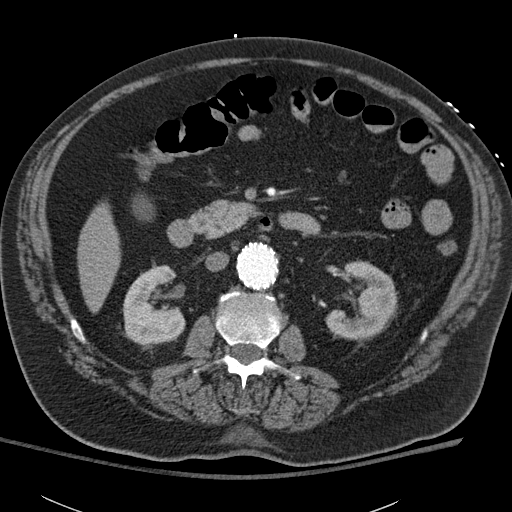
[im 79/118  soft-tissue]
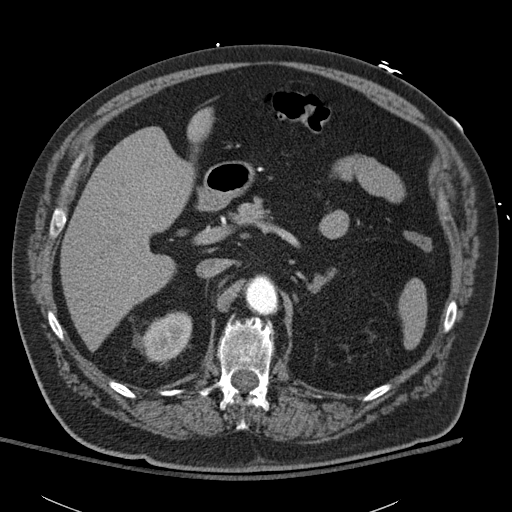

[Series 6: venous 5.0 b30f · axial · portal-venous · 0.82mm/px · z∈[+1235,+1505]mm · 8 of 70 slices shown, 13 images]
[im 8/70  soft-tissue]
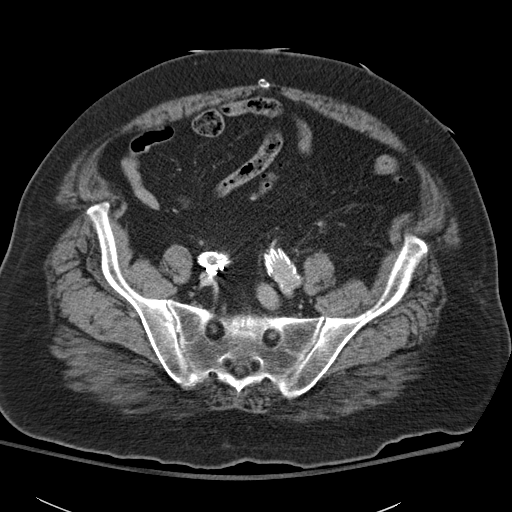
[im 8/70  bone]
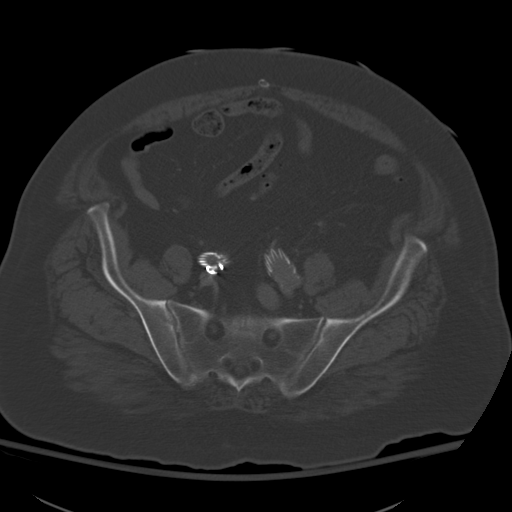
[im 16/70  soft-tissue]
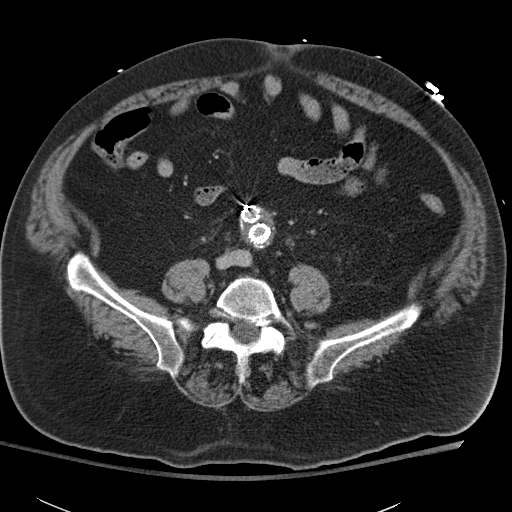
[im 24/70  soft-tissue]
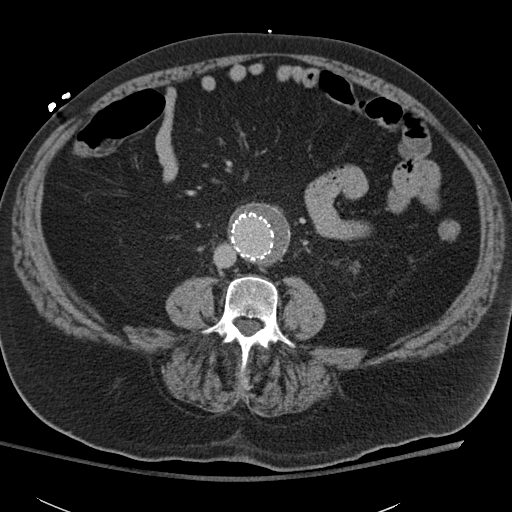
[im 31/70  soft-tissue]
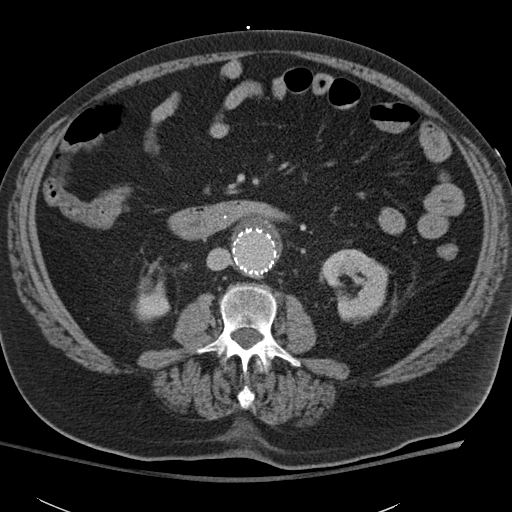
[im 39/70  soft-tissue]
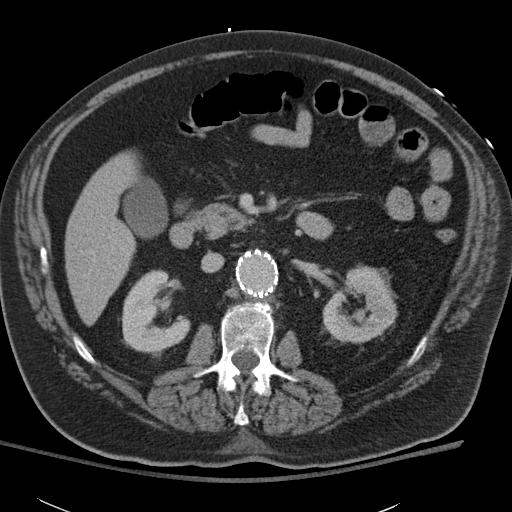
[im 39/70  lung]
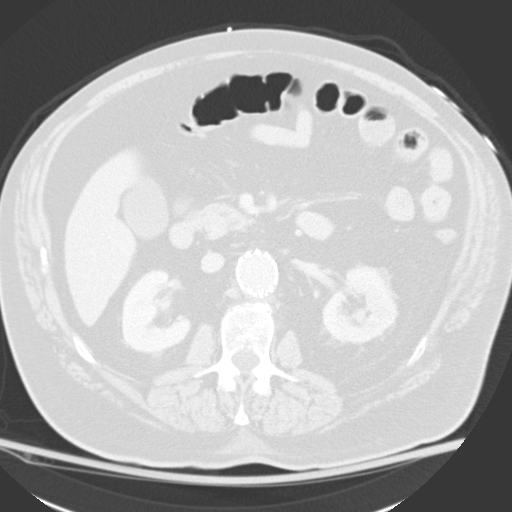
[im 47/70  soft-tissue]
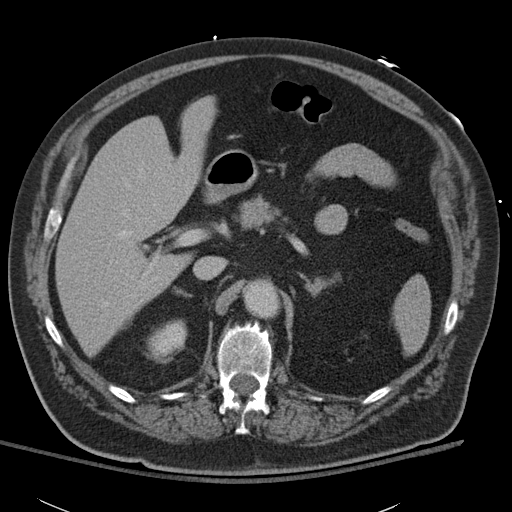
[im 47/70  lung]
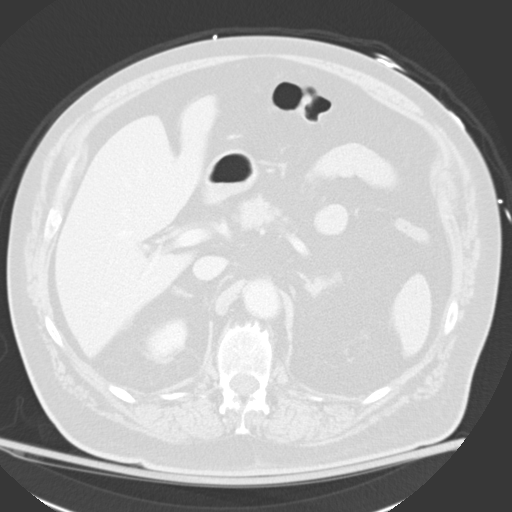
[im 54/70  soft-tissue]
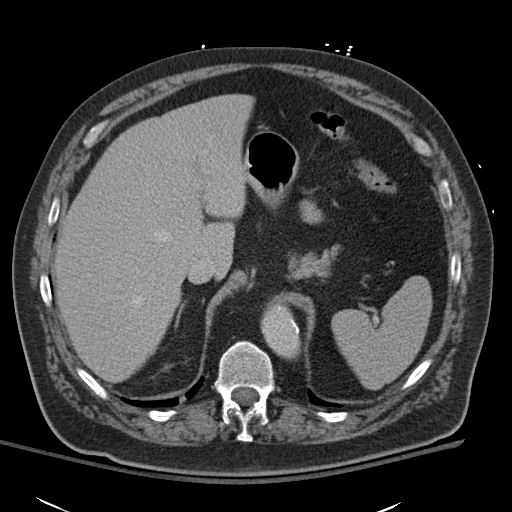
[im 54/70  lung]
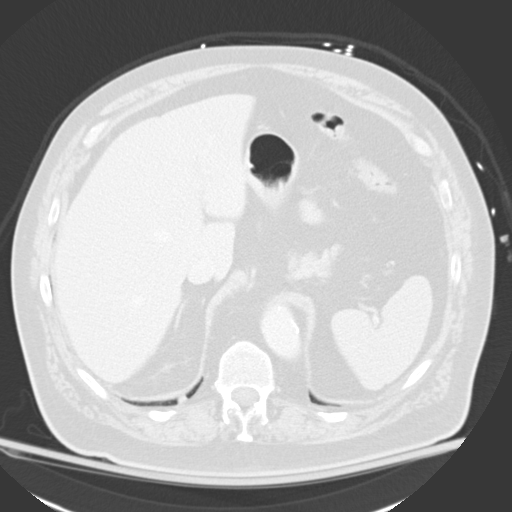
[im 62/70  soft-tissue]
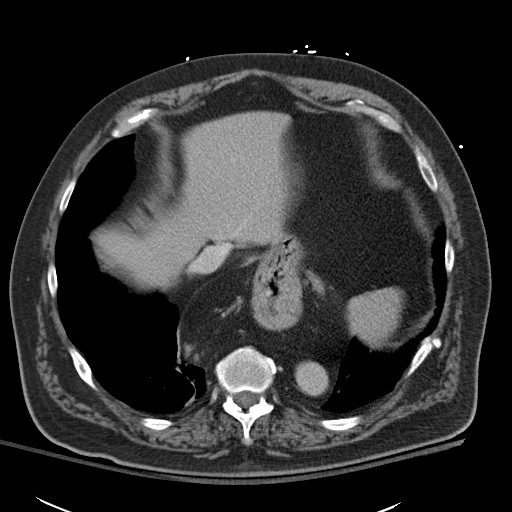
[im 62/70  lung]
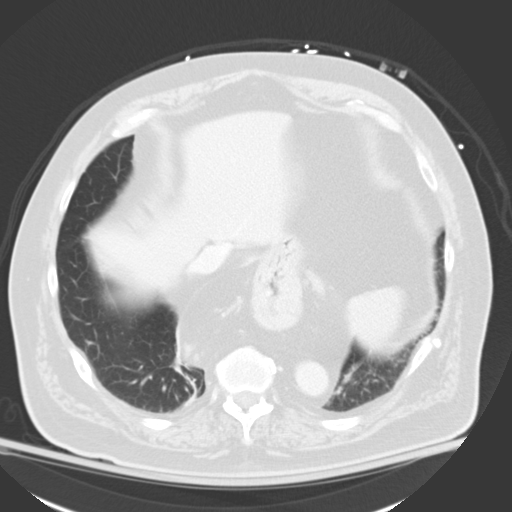

[14 of 32 positions shown; findings below may reference images not displayed]

FINDINGS: There is a large hiatal hernia containing proximal stomach and
intraperitoneal fat. Heart is normal size. No confluent airspace
opacities in the lung bases. No effusions. Calcifications noted in
the visualize right coronary artery.

Liver, gallbladder, spleen, pancreas, adrenals and kidneys are
unremarkable.

Patient is status post stent graft repair of an abdominal aortic
aneurysm. Aneurysm sac size measures 5.1 cm maximally compared to
5.5 cm previously. No evidence of endoleak. Mesenteric vessels and
renal arteries are patent without focal stenosis.

Descending colonic and sigmoid diverticulosis. No active
diverticulitis. Small bowel is decompressed. No free fluid, free air
or adenopathy.

No acute bony abnormality.

Review of the MIP images confirms the above findings.
IMPRESSION: Prior stent graft repair of an abdominal aortic aneurysm. Slight
decreased size of the aneurysm sac. No evidence of endoleak.

Left colonic diverticulosis.

Large hiatal hernia.

Coronary artery disease.

## 2014-07-08 IMAGING — CR DG CHEST 1V PORT
1 series · 2 of 2 positions shown · non-contrast
Comparison: Chest radiograph October 15, 2013.

CLINICAL DATA: Worsening shortness of breath, history of COPD.

EXAM:
PORTABLE CHEST - 1 VIEW

[Series 1: AP · U · 2 of 2 slices shown]
[im 1/2]
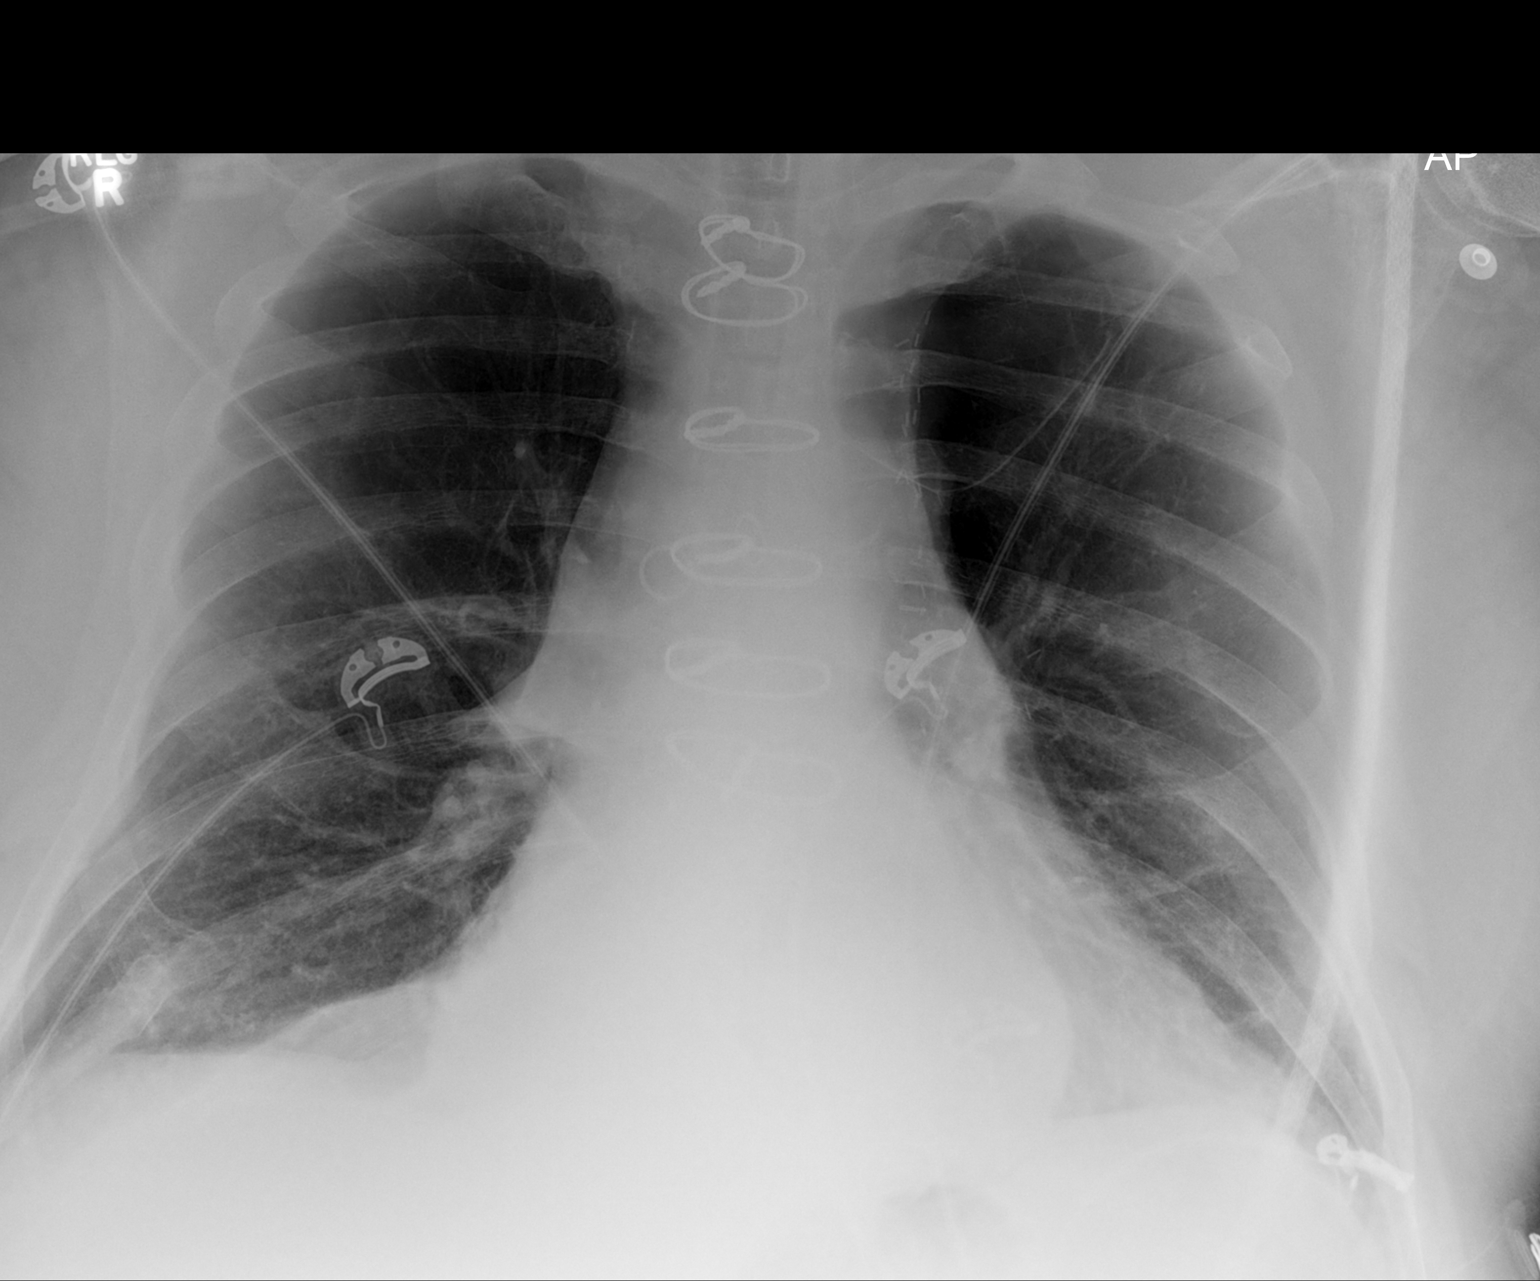
[im 2/2]
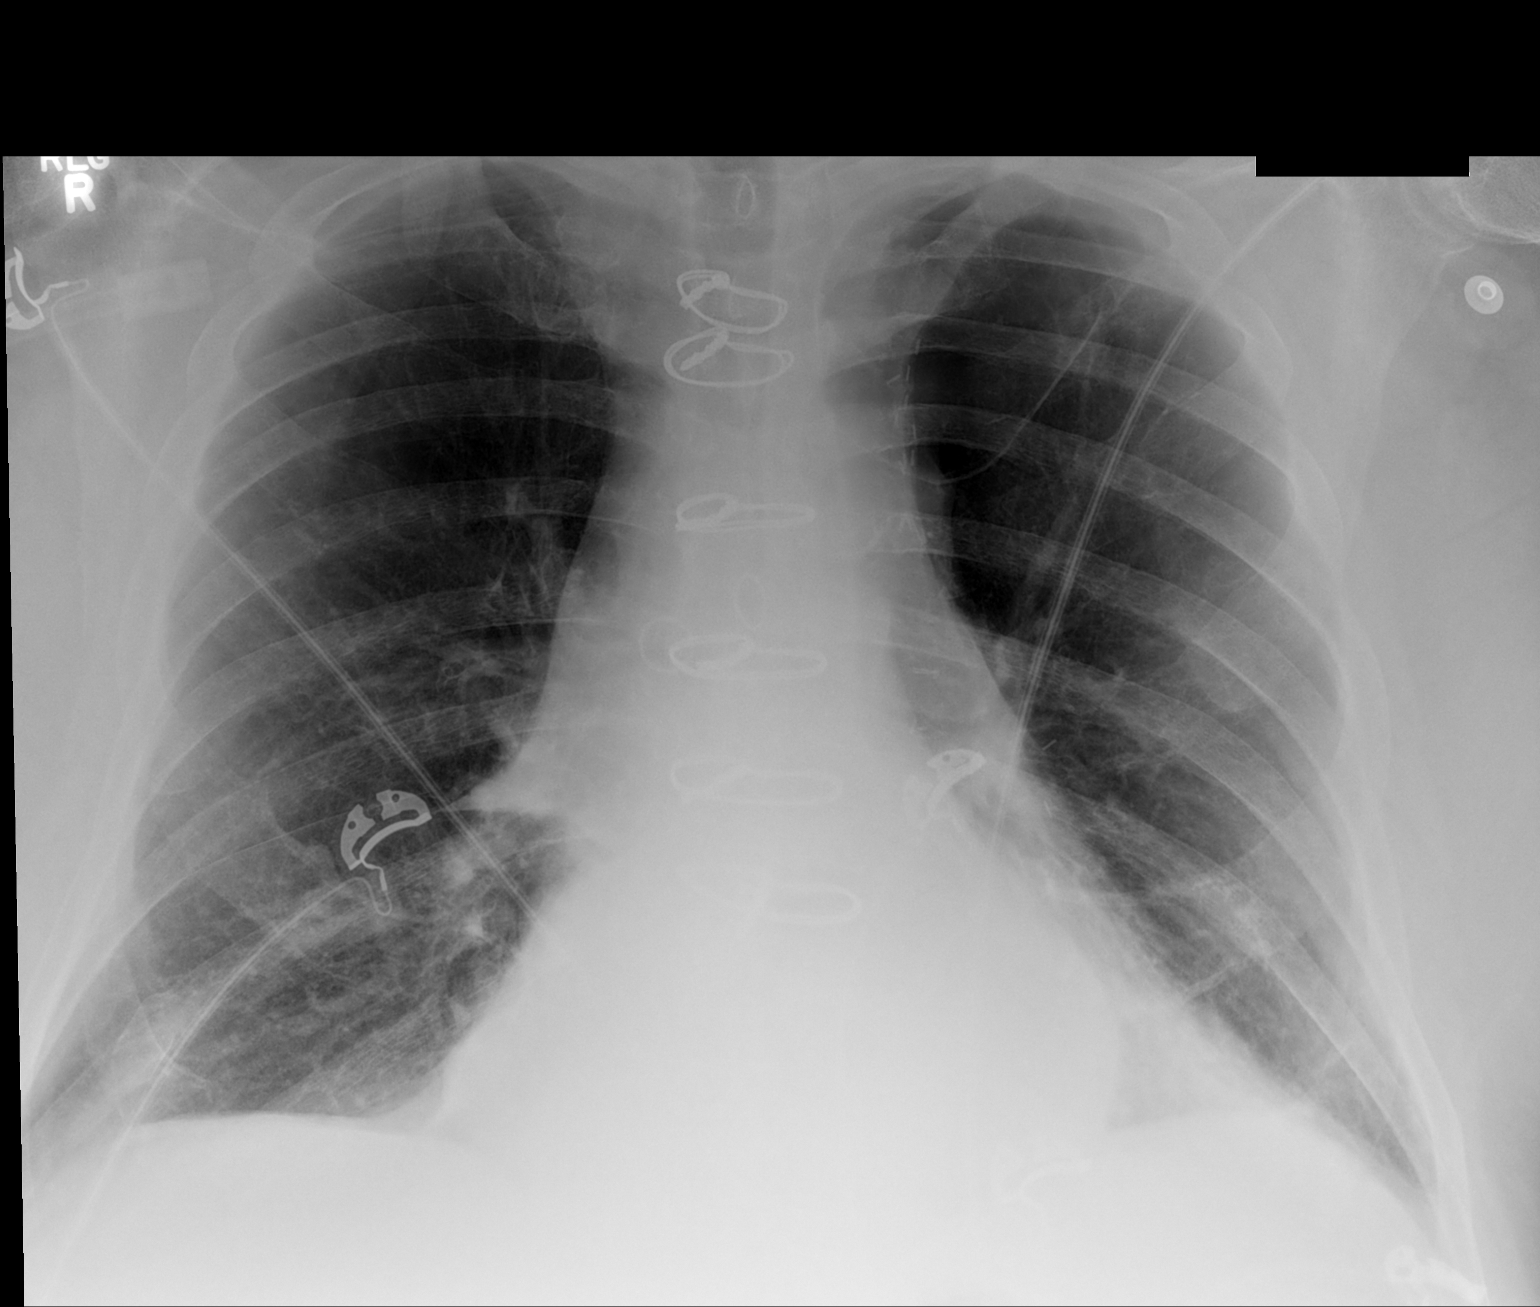

[2 of 2 positions shown; findings below may reference images not displayed]

FINDINGS: Cardiac silhouette remains enlarged, mediastinal silhouette is
nonsuspicious, status post median sternotomy. Mild chronic
interstitial changes without pleural effusions or focal
consolidations. Apical bullous changes. Hiatal hernia. No
pneumothorax.

Remote right posterior rib fractures. Soft tissue planes are
nonsuspicious. Multiple EKG lines overlie the patient and may
obscure subtle underlying pathology.
IMPRESSION: Stable cardiomegaly and chronic interstitial changes without
superimposed acute pulmonary process.

  By: Ydriss Tracy

## 2014-07-11 ENCOUNTER — Encounter: Payer: Self-pay | Admitting: Family Medicine

## 2014-07-11 ENCOUNTER — Ambulatory Visit (INDEPENDENT_AMBULATORY_CARE_PROVIDER_SITE_OTHER): Payer: Commercial Managed Care - HMO | Admitting: Family Medicine

## 2014-07-11 VITALS — BP 148/80 | HR 77 | Temp 97.9°F | Resp 16 | Ht 74.0 in | Wt 261.0 lb

## 2014-07-11 DIAGNOSIS — F411 Generalized anxiety disorder: Secondary | ICD-10-CM

## 2014-07-11 DIAGNOSIS — F3289 Other specified depressive episodes: Secondary | ICD-10-CM

## 2014-07-11 DIAGNOSIS — R5382 Chronic fatigue, unspecified: Secondary | ICD-10-CM

## 2014-07-11 DIAGNOSIS — E559 Vitamin D deficiency, unspecified: Secondary | ICD-10-CM

## 2014-07-11 DIAGNOSIS — R5381 Other malaise: Secondary | ICD-10-CM

## 2014-07-11 DIAGNOSIS — I10 Essential (primary) hypertension: Secondary | ICD-10-CM

## 2014-07-11 DIAGNOSIS — Z23 Encounter for immunization: Secondary | ICD-10-CM

## 2014-07-11 DIAGNOSIS — R5383 Other fatigue: Secondary | ICD-10-CM

## 2014-07-11 DIAGNOSIS — Z79899 Other long term (current) drug therapy: Secondary | ICD-10-CM

## 2014-07-11 DIAGNOSIS — G894 Chronic pain syndrome: Secondary | ICD-10-CM

## 2014-07-11 DIAGNOSIS — F32A Depression, unspecified: Secondary | ICD-10-CM

## 2014-07-11 DIAGNOSIS — F329 Major depressive disorder, single episode, unspecified: Secondary | ICD-10-CM

## 2014-07-11 DIAGNOSIS — D509 Iron deficiency anemia, unspecified: Secondary | ICD-10-CM

## 2014-07-11 DIAGNOSIS — E785 Hyperlipidemia, unspecified: Secondary | ICD-10-CM

## 2014-07-11 DIAGNOSIS — E119 Type 2 diabetes mellitus without complications: Secondary | ICD-10-CM

## 2014-07-11 DIAGNOSIS — G4700 Insomnia, unspecified: Secondary | ICD-10-CM

## 2014-07-11 LAB — CBC WITH DIFFERENTIAL/PLATELET
BASOS PCT: 0 % (ref 0–1)
Basophils Absolute: 0 10*3/uL (ref 0.0–0.1)
EOS ABS: 0.1 10*3/uL (ref 0.0–0.7)
Eosinophils Relative: 1 % (ref 0–5)
HEMATOCRIT: 39.3 % (ref 39.0–52.0)
HEMOGLOBIN: 13 g/dL (ref 13.0–17.0)
LYMPHS ABS: 1.2 10*3/uL (ref 0.7–4.0)
Lymphocytes Relative: 12 % (ref 12–46)
MCH: 28.9 pg (ref 26.0–34.0)
MCHC: 33.1 g/dL (ref 30.0–36.0)
MCV: 87.3 fL (ref 78.0–100.0)
MONO ABS: 0.7 10*3/uL (ref 0.1–1.0)
Monocytes Relative: 7 % (ref 3–12)
Neutro Abs: 7.7 10*3/uL (ref 1.7–7.7)
Neutrophils Relative %: 80 % — ABNORMAL HIGH (ref 43–77)
Platelets: 237 10*3/uL (ref 150–400)
RBC: 4.5 MIL/uL (ref 4.22–5.81)
RDW: 15.4 % (ref 11.5–15.5)
WBC: 9.6 10*3/uL (ref 4.0–10.5)

## 2014-07-11 LAB — COMPREHENSIVE METABOLIC PANEL
ALBUMIN: 4 g/dL (ref 3.5–5.2)
ALT: 11 U/L (ref 0–53)
AST: 10 U/L (ref 0–37)
Alkaline Phosphatase: 136 U/L — ABNORMAL HIGH (ref 39–117)
BUN: 9 mg/dL (ref 6–23)
CALCIUM: 9 mg/dL (ref 8.4–10.5)
CHLORIDE: 102 meq/L (ref 96–112)
CO2: 26 mEq/L (ref 19–32)
Creat: 0.97 mg/dL (ref 0.50–1.35)
Glucose, Bld: 182 mg/dL — ABNORMAL HIGH (ref 70–99)
Potassium: 4.3 mEq/L (ref 3.5–5.3)
Sodium: 141 mEq/L (ref 135–145)
Total Bilirubin: 0.4 mg/dL (ref 0.2–1.2)
Total Protein: 7 g/dL (ref 6.0–8.3)

## 2014-07-11 LAB — FERRITIN: Ferritin: 22 ng/mL (ref 22–322)

## 2014-07-11 LAB — TSH: TSH: 3.569 u[IU]/mL (ref 0.350–4.500)

## 2014-07-11 LAB — VITAMIN B12: Vitamin B-12: 345 pg/mL (ref 211–911)

## 2014-07-11 MED ORDER — ZOSTER VACCINE LIVE 19400 UNT/0.65ML ~~LOC~~ SOLR: 0.6500 mL | Freq: Once | SUBCUTANEOUS | Status: DC

## 2014-07-11 MED ORDER — ALPRAZOLAM 0.5 MG PO TABS: 0.5000 mg | ORAL_TABLET | Freq: Three times a day (TID) | ORAL | Status: DC | PRN

## 2014-07-11 MED ORDER — CITALOPRAM HYDROBROMIDE 40 MG PO TABS: 40.0000 mg | ORAL_TABLET | Freq: Every day | ORAL | Status: DC

## 2014-07-11 MED ORDER — ZOLPIDEM TARTRATE 10 MG PO TABS: ORAL_TABLET | ORAL | Status: DC

## 2014-07-11 MED ORDER — HYDROCODONE-ACETAMINOPHEN 5-325 MG PO TABS: 1.0000 | ORAL_TABLET | Freq: Two times a day (BID) | ORAL | Status: DC | PRN

## 2014-07-11 MED ORDER — LISINOPRIL 5 MG PO TABS: 5.0000 mg | ORAL_TABLET | Freq: Every day | ORAL | Status: DC

## 2014-07-11 NOTE — Patient Instructions (Addendum)
Increase your citalopram to 40mg  (take 2 of the 20mg ). Start the blood pressure medication lisinopril. We are going to recheck your labs again when I see you back in 3 months - at this time if you are fasting so we can check your cholesterol, that would be great. Get your shingles vaccine at the pharmacy. Start the new lisinopril blood pressure pill to protect your kidneys and heart. Sometimes this can cause a dry cough - it is not causing any harm to your lungs but we certainly don't need you coughing more so if this is the case, it is ok to stop and let me know.  Managing Your High Blood Pressure Blood pressure is a measurement of how forceful your blood is pressing against the walls of the arteries. Arteries are muscular tubes within the circulatory system. Blood pressure does not stay the same. Blood pressure rises when you are active, excited, or nervous; and it lowers during sleep and relaxation. If the numbers measuring your blood pressure stay above normal most of the time, you are at risk for health problems. High blood pressure (hypertension) is a long-term (chronic) condition in which blood pressure is elevated. A blood pressure reading is recorded as two numbers, such as 120 over 80 (or 120/80). The first, higher number is called the systolic pressure. It is a measure of the pressure in your arteries as the heart beats. The second, lower number is called the diastolic pressure. It is a measure of the pressure in your arteries as the heart relaxes between beats.  Keeping your blood pressure in a normal range is important to your overall health and prevention of health problems, such as heart disease and stroke. When your blood pressure is uncontrolled, your heart has to work harder than normal. High blood pressure is a very common condition in adults because blood pressure tends to rise with age. Men and women are equally likely to have hypertension but at different times in life. Before age 10,  men are more likely to have hypertension. After 72 years of age, women are more likely to have it. Hypertension is especially common in African Americans. This condition often has no signs or symptoms. The cause of the condition is usually not known. Your caregiver can help you come up with a plan to keep your blood pressure in a normal, healthy range. BLOOD PRESSURE STAGES Blood pressure is classified into four stages: normal, prehypertension, stage 1, and stage 2. Your blood pressure reading will be used to determine what type of treatment, if any, is necessary. Appropriate treatment options are tied to these four stages:  Normal  Systolic pressure (mm Hg): below 120.  Diastolic pressure (mm Hg): below 80. Prehypertension  Systolic pressure (mm Hg): 120 to 139.  Diastolic pressure (mm Hg): 80 to 89. Stage1  Systolic pressure (mm Hg): 140 to 159.  Diastolic pressure (mm Hg): 90 to 99. Stage2  Systolic pressure (mm Hg): 160 or above.  Diastolic pressure (mm Hg): 100 or above. RISKS RELATED TO HIGH BLOOD PRESSURE Managing your blood pressure is an important responsibility. Uncontrolled high blood pressure can lead to:  A heart attack.  A stroke.  A weakened blood vessel (aneurysm).  Heart failure.  Kidney damage.  Eye damage.  Metabolic syndrome.  Memory and concentration problems. HOW TO MANAGE YOUR BLOOD PRESSURE Blood pressure can be managed effectively with lifestyle changes and medicines (if needed). Your caregiver will help you come up with a plan to bring your blood  pressure within a normal range. Your plan should include the following: Education  Read all information provided by your caregivers about how to control blood pressure.  Educate yourself on the latest guidelines and treatment recommendations. New research is always being done to further define the risks and treatments for high blood pressure. Lifestylechanges  Control your weight.  Avoid  smoking.  Stay physically active.  Reduce the amount of salt in your diet.  Reduce stress.  Control any chronic conditions, such as high cholesterol or diabetes.  Reduce your alcohol intake. Medicines  Several medicines (antihypertensive medicines) are available, if needed, to bring blood pressure within a normal range. Communication  Review all the medicines you take with your caregiver because there may be side effects or interactions.  Talk with your caregiver about your diet, exercise habits, and other lifestyle factors that may be contributing to high blood pressure.  See your caregiver regularly. Your caregiver can help you create and adjust your plan for managing high blood pressure. RECOMMENDATIONS FOR TREATMENT AND FOLLOW-UP  The following recommendations are based on current guidelines for managing high blood pressure in nonpregnant adults. Use these recommendations to identify the proper follow-up period or treatment option based on your blood pressure reading. You can discuss these options with your caregiver.  Systolic pressure of 630 to 160 or diastolic pressure of 80 to 89: Follow up with your caregiver as directed.  Systolic pressure of 109 to 323 or diastolic pressure of 90 to 100: Follow up with your caregiver within 2 months.  Systolic pressure above 557 or diastolic pressure above 322: Follow up with your caregiver within 1 month.  Systolic pressure above 025 or diastolic pressure above 427: Consider antihypertensive therapy; follow up with your caregiver within 1 week.  Systolic pressure above 062 or diastolic pressure above 376: Begin antihypertensive therapy; follow up with your caregiver within 1 week. Document Released: 06/27/2012 Document Reviewed: 06/27/2012 [facility] Patient Information 2015 [place]. This information is not intended to replace advice given to you by your health care provider. Make sure you discuss any questions you have with your  health care provider.

## 2014-07-11 NOTE — Progress Notes (Addendum)
Subjective:    Patient ID: [name], male    DOB: 11/07/1940, 73 y.o.   MRN: [id number] This chart was scribed for Shawnee Knapp, MD by Cathie Hoops, ED Scribe. The patient was seen in Room 21. The patient's care was started at 3:48 PM.   07/11/2014  Chief Complaint  Patient presents with  . Medication Refill    hydrocodone, xanax    HPI HPI Comments: [name] is a 73 y.o. male who presents to the [facility] for medication refill for hydrocodone and xanax. Pt also requests a referral for a shingles vaccine.      Pain Management Pt notes he has chronic pain in his hips, legs bilaterally, and compressed discs. He states he would like to come here rather than a pain management specialist. Pt notes he received Norco prescribed at the walk-in clinic and he uses them as needed.  Gastroenterologist  Pt denies black stools or blood in his stools.  Depression Pt has some Xanax left, which he as been taking for over year as needed during panic attacks. Pt notes he hs 2-3 panic attacks per week. Pt experiences increased BP and SOB. Pt also notes some shakiness in his hands bilaterally. He notes he goes to [place] for gastroenterology. He is sending his records to Dr. Jackalyn Lombard office and waiting for them to receive them. Pt denies being more anxious or being in more pain. Pt denies checking his blood sugar or his BP at home. He wants to start walking since he has some recent increased energy. He also notes he has been having episodes of depression. He believes he has taken Wellbutrin previously and felt like he was going to "jump out of his skin". Pt reports Dr. Elder Cyphers told him to take a medicaiton to counteract Wellbutrin. He notes he has recently been lying in the bed until 4-5:00 pm but that has improved for the past two days. Pt chooses not to comment about thoughts of self-harm. Pt does have feelings of being "fed-up with life".   He is taking 325 mg of ferrous sulfate  bid with no constipation or other side effects.  Need to see if Mr. Stetson got in with a pain management specialist and a GI doctor. Prior to today he had been on HCTZ. Pt has not been on ACEI or ARB previously, of note he did try simvastatin and Crestor prior to his Lipitor.    Past Medical History  Diagnosis Date  . Allergic rhinitis, cause unspecified   . Other and unspecified hyperlipidemia   . 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 Unspecified essential hypertension   . 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 Severe bullous disease of LUL. Prolonged intubation after surgeries due to COPD  . 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 Anemia     Recurrent microcytic, presumably GI   . Complication of anesthesia     trouble getting off ventilator  . Memory loss   . Diabetes   . CHF (congestive heart failure)   . CAD (coronary artery disease)    Current Outpatient Prescriptions on File Prior to Visit  Medication Sig Dispense Refill  . albuterol (PROVENTIL HFA;VENTOLIN HFA) 108 (90 BASE) MCG/ACT inhaler Inhale 2 puffs into the lungs every 6 (six) hours as needed  for wheezing.      Marland Kitchen ALPRAZolam (XANAX) 0.5 MG tablet Take 1 tablet (0.5 mg total) by mouth 3 (three) times daily as needed for anxiety (for shortness of breath).  20 tablet  0  . budesonide-formoterol (SYMBICORT) 160-4.5 MCG/ACT inhaler Inhale 2 puffs into the lungs 2 (two) times daily.  1 Inhaler  0  . citalopram (CELEXA) 20 MG tablet Take 1 tablet (20 mg total) by mouth daily.  30 tablet  5  . clobetasol ointment (TEMOVATE) 0.01 % Apply 1 application topically 2 (two) times daily as needed (rash).       . folic acid (FOLVITE) 1 MG tablet Take 2 tablets (2 mg total) by mouth every morning.  60 tablet  11  . HYDROcodone-acetaminophen (NORCO/VICODIN) 5-325 MG  per tablet Take 1 tablet by mouth every 12 (twelve) hours as needed for moderate pain.  60 tablet  0  . iron polysaccharides (NIFEREX) 150 MG capsule Take 150 mg by mouth 2 (two) times daily.      . metFORMIN (GLUCOPHAGE) 500 MG tablet Take 500 mg by mouth 2 (two) times daily with a meal.      . metoprolol tartrate (LOPRESSOR) 25 MG tablet Take 1 tablet (25 mg total) by mouth 2 (two) times daily.  60 tablet  11  . pantoprazole (PROTONIX) 40 MG tablet Take 40 mg by mouth 2 (two) times daily.      Marland Kitchen tiotropium (SPIRIVA) 18 MCG inhalation capsule Place 1 capsule (18 mcg total) into inhaler and inhale daily.  30 capsule  3  . zolpidem (AMBIEN) 10 MG tablet TAKE 1 TABLET BY MOUTH AT BEDTIME AS NEEDED FOR SLEEP  30 tablet  0  . atorvastatin (LIPITOR) 40 MG tablet Take 1 tablet (40 mg total) by mouth at bedtime.  90 tablet  11   No current facility-administered medications on file prior to visit.   Allergies  Allergen Reactions  . Penicillins Anaphylaxis and Hives  . Demerol [Meperidine] Other (See Comments)    hallucinations  . Dilaudid [Hydromorphone Hcl] Other (See Comments)    hallucinations  . Levaquin [Levofloxacin In D5w]     Nausea and diarrhea  . Morphine And Related Nausea Only and Other (See Comments)    hallucinations    Review of Systems  Constitutional: Negative for fever and chills.  Respiratory: Negative for shortness of breath.   Gastrointestinal: Negative for nausea, vomiting and blood in stool.  Musculoskeletal: Positive for arthralgias.  Neurological: Negative for weakness.  Psychiatric/Behavioral: Negative for agitation. The patient is not nervous/anxious.    Objective:  Triage Vitals: BP 148/80  Pulse 77  Temp(Src) 97.9 F (36.6 C) (Oral)  Resp 16  Ht $R'6\' 2"'DG$  (1.88 m)  Wt 261 lb (118.389 kg)  BMI 33.50 kg/m2  SpO2 96% Physical Exam  Nursing note and vitals reviewed. Constitutional: He is oriented to person, place, and time. He appears well-developed and  well-nourished. No distress.  HENT:  Head: Normocephalic and atraumatic.  Eyes: Conjunctivae and EOM are normal.  Neck: Neck supple. No tracheal deviation present.  Cardiovascular: Normal rate, regular rhythm, S1 normal, S2 normal and normal heart sounds.   Pulmonary/Chest: Effort normal. No respiratory distress. He has decreased breath sounds.  Musculoskeletal: Normal range of motion.  Neurological: He is alert and oriented to person, place, and time.  Skin: Skin is warm and dry.  Psychiatric: He has a normal mood and affect. His behavior is normal.    Assessment & Plan:  4:08 PM-  Patient informed of current plan for treatment and evaluation and agrees with plan at this time.  Need for prophylactic vaccination and inoculation against influenza - Plan: Flu Vaccine QUAD 36+ mos IM, rec zostavax from pharmacy  HYPERTENSION - start lisinopril - will need bmp at f/u in 3 mos  Microcytic anemia - Plan: CBC with Differential, Ferritin - iron low - start daily iron supp and high iron diet.   HYPERLIPIDEMIA - needs flp at next OV  Chronic fatigue - Plan: Comprehensive metabolic panel, TSH, Vitamin B12, Vit D  25 hydroxy (rtn osteoporosis monitoring) - B12 on low end of normal range - start otc b12 supp  Depression - Plan: Comprehensive metabolic panel, TSH, Vitamin B12, Vit D  25 hydroxy (rtn osteoporosis monitoring) - Increase citalopram from 20  to 16m  Anxiety state, unspecified - refilled xanax  Insomnia  Encounter for long-term (current) use of other medications - Plan: Comprehensive metabolic panel  Vitamin D deficiency - level undetectable today - start wkly high dose replacement x 9 mos.  [facility]ronic [facility] syndrome [facility] z[facility]pidem, only 1 refill of xanax and 1 refill of Norco in the past 3 months.   DM - nml a1c (5.6) 2 mos prior on metformin  Meds ordered this encounter  Medications  . zoster vaccine live,  PF, (ZOSTAVAX) 154627UNT/0.65ML injection    Sig: Inject 19,400 Units into the skin once.    Dispense:  1 vial    Refill:  0  . zolpidem (AMBIEN) 10 MG tablet    Sig: TAKE 1 TABLET BY MOUTH AT BEDTIME AS NEEDED FOR SLEEP    Dispense:  30 tablet    Refill:  3    Not to exceed 4 additional fills before 10/28/2014.  .Marland KitchenALPRAZolam (XANAX) 0.5 MG tablet    Sig: Take 1 tablet (0.5 mg total) by mouth 3 (three) times daily as needed for anxiety (for shortness of breath).    Dispense:  20 tablet    Refill:  2    Ok to fill q28d  . HYDROcodone-acetaminophen (NORCO/VICODIN) 5-325 MG per tablet    Sig: Take 1 tablet by mouth every 12 (twelve) hours as needed for moderate pain.    Dispense:  60 tablet    Refill:  0  . citalopram (CELEXA) 40 MG tablet    Sig: Take 1 tablet (40 mg total) by mouth daily.    Dispense:  30 tablet    Refill:  5  . lisinopril (PRINIVIL,ZESTRIL) 5 MG tablet    Sig: Take 1 tablet (5 mg total) by mouth daily.    Dispense:  30 tablet    Refill:  3  . ergocalciferol (VITAMIN D2) 50000 UNITS capsule    Sig: Take 1 capsule (50,000 Units total) by mouth once a week.    Dispense:  12 capsule    Refill:  2    I personally performed the services described in this documentation, which was scribed in my presence. The recorded information has been reviewed and considered, and addended by me as needed.  EDelman Cheadle MD MPH   Results for orders placed in visit on 07/11/14  CBC WITH DIFFERENTIAL      Result Value Ref Range   WBC 9.6  4.0 - 10.5 K/uL   RBC 4.50  4.22 - 5.81 MIL/uL   Hemoglobin 13.0  13.0 - 17.0 g/dL   HCT 39.3  39.0 - 52.0 %   MCV  87.3  78.0 - 100.0 fL   MCH 28.9  26.0 - 34.0 pg   MCHC 33.1  30.0 - 36.0 g/dL   RDW 15.4  11.5 - 15.5 %   Platelets 237  150 - 400 K/uL   Neutrophils Relative % 80 (*) 43 - 77 %   Neutro Abs 7.7  1.7 - 7.7 K/uL   Lymphocytes Relative 12  12 - 46 %   Lymphs Abs 1.2  0.7 - 4.0 K/uL   Monocytes Relative 7  3 - 12 %   Monocytes  Absolute 0.7  0.1 - 1.0 K/uL   Eosinophils Relative 1  0 - 5 %   Eosinophils Absolute 0.1  0.0 - 0.7 K/uL   Basophils Relative 0  0 - 1 %   Basophils Absolute 0.0  0.0 - 0.1 K/uL   Smear Review Criteria for review not met    COMPREHENSIVE METABOLIC PANEL      Result Value Ref Range   Sodium 141  135 - 145 mEq/L   Potassium 4.3  3.5 - 5.3 mEq/L   Chloride 102  96 - 112 mEq/L   CO2 26  19 - 32 mEq/L   Glucose, Bld 182 (*) 70 - 99 mg/dL   BUN 9  6 - 23 mg/dL   Creat 0.97  0.50 - 1.35 mg/dL   Total Bilirubin 0.4  0.2 - 1.2 mg/dL   Alkaline Phosphatase 136 (*) 39 - 117 U/L   AST 10  0 - 37 U/L   ALT 11  0 - 53 U/L   Total Protein 7.0  6.0 - 8.3 g/dL   Albumin 4.0  3.5 - 5.2 g/dL   Calcium 9.0  8.4 - 10.5 mg/dL  TSH      Result Value Ref Range   TSH 3.569  0.350 - 4.500 uIU/mL  VITAMIN B12      Result Value Ref Range   Vitamin B-12 345  211 - 911 pg/mL  VITAMIN D 25 HYDROXY      Result Value Ref Range   Vit D, 25-Hydroxy <10 (*) 30 - 89 ng/mL  FERRITIN      Result Value Ref Range   Ferritin 22  22 - 322 ng/mL

## 2014-07-12 LAB — VITAMIN D 25 HYDROXY (VIT D DEFICIENCY, FRACTURES): Vit D, 25-Hydroxy: <10 ng/mL — ABNORMAL LOW (ref 30–89)

## 2014-07-15 ENCOUNTER — Telehealth: Payer: Self-pay

## 2014-07-15 NOTE — Telephone Encounter (Signed)
Pt was concerned about starting the Lisinopril. Pt thought this replaced another medication. It was added last OV and pt can begin taking it. Discussed the dry cough associated with medication and to call us if any concerns.

## 2014-07-15 NOTE — Telephone Encounter (Signed)
MS. [name] stated her husband was given a medication and they don't know what to do with the old one, whether he continue taking the old or try something new Please call [phone] and she is really anxious,.

## 2014-07-30 ENCOUNTER — Encounter: Payer: Self-pay | Admitting: Family Medicine

## 2014-08-05 ENCOUNTER — Telehealth: Payer: Self-pay | Admitting: Pulmonary Disease

## 2014-08-05 MED ORDER — BUDESONIDE-FORMOTEROL FUMARATE 160-4.5 MCG/ACT IN AERO
2.0000 | INHALATION_SPRAY | Freq: Two times a day (BID) | RESPIRATORY_TRACT | Status: DC
Start: 2014-08-05 — End: 2014-09-15

## 2014-08-05 NOTE — Telephone Encounter (Signed)
Spoke with pt wife, [name] Requests refill of Symbicort be sent to [place], [place] with no refills. Pt wife advised that for further refills he will need to make an appt as we cannot continue to supply refills and not follow up with him atleast once yearly. Pt wife refused to schedule appt.  Nothing further needed.

## 2014-08-10 ENCOUNTER — Encounter: Payer: Self-pay | Admitting: Family Medicine

## 2014-08-10 DIAGNOSIS — G894 Chronic pain syndrome: Secondary | ICD-10-CM | POA: Insufficient documentation

## 2014-08-10 DIAGNOSIS — E559 Vitamin D deficiency, unspecified: Secondary | ICD-10-CM | POA: Insufficient documentation

## 2014-08-10 DIAGNOSIS — G4700 Insomnia, unspecified: Secondary | ICD-10-CM

## 2014-08-10 DIAGNOSIS — Z79899 Other long term (current) drug therapy: Secondary | ICD-10-CM | POA: Insufficient documentation

## 2014-08-10 DIAGNOSIS — D509 Iron deficiency anemia, unspecified: Secondary | ICD-10-CM | POA: Insufficient documentation

## 2014-08-10 HISTORY — DX: Vitamin D deficiency, unspecified: E55.9

## 2014-08-10 HISTORY — DX: Insomnia, unspecified: G47.00

## 2014-08-10 MED ORDER — ERGOCALCIFEROL 1.25 MG (50000 UT) PO CAPS: 50000.0000 [IU] | ORAL_CAPSULE | ORAL | Status: DC

## 2014-08-12 ENCOUNTER — Telehealth: Payer: Self-pay

## 2014-08-12 MED ORDER — METFORMIN HCL 500 MG PO TABS
500.0000 mg | ORAL_TABLET | Freq: Two times a day (BID) | ORAL | Status: DC
Start: 2014-08-12 — End: 2014-11-06

## 2014-08-12 NOTE — Telephone Encounter (Signed)
Patient's wife called - states wrong medication was refilled. States he needs the medication for blood sugar, not the high blood pressure. Please return call and advise. CB # [phone]

## 2014-08-12 NOTE — Telephone Encounter (Signed)
Sent in refill. Pt wife advised.

## 2014-08-24 ENCOUNTER — Encounter (HOSPITAL_COMMUNITY): Payer: Self-pay | Admitting: Emergency Medicine

## 2014-08-24 ENCOUNTER — Inpatient Hospital Stay (HOSPITAL_COMMUNITY)
Admission: EM | Admit: 2014-08-24 | Discharge: 2014-08-25 | DRG: 378 | Disposition: A | Discharge status: Home / Self Care | Payer: Medicare HMO | Attending: Internal Medicine | Admitting: Internal Medicine

## 2014-08-24 DIAGNOSIS — I714 Abdominal aortic aneurysm, without rupture, unspecified: Secondary | ICD-10-CM | POA: Diagnosis present

## 2014-08-24 DIAGNOSIS — D500 Iron deficiency anemia secondary to blood loss (chronic): Secondary | ICD-10-CM | POA: Diagnosis present

## 2014-08-24 DIAGNOSIS — F419 Anxiety disorder, unspecified: Secondary | ICD-10-CM | POA: Diagnosis present

## 2014-08-24 DIAGNOSIS — Z9889 Other specified postprocedural states: Secondary | ICD-10-CM

## 2014-08-24 DIAGNOSIS — Z951 Presence of aortocoronary bypass graft: Secondary | ICD-10-CM | POA: Diagnosis not present

## 2014-08-24 DIAGNOSIS — Z881 Allergy status to other antibiotic agents status: Secondary | ICD-10-CM | POA: Diagnosis not present

## 2014-08-24 DIAGNOSIS — Z79899 Other long term (current) drug therapy: Secondary | ICD-10-CM

## 2014-08-24 DIAGNOSIS — R413 Other amnesia: Secondary | ICD-10-CM | POA: Diagnosis present

## 2014-08-24 DIAGNOSIS — K922 Gastrointestinal hemorrhage, unspecified: Secondary | ICD-10-CM | POA: Diagnosis present

## 2014-08-24 DIAGNOSIS — K921 Melena: Secondary | ICD-10-CM | POA: Insufficient documentation

## 2014-08-24 DIAGNOSIS — Z880 Allergy status to penicillin: Secondary | ICD-10-CM

## 2014-08-24 DIAGNOSIS — Z885 Allergy status to narcotic agent status: Secondary | ICD-10-CM | POA: Diagnosis not present

## 2014-08-24 DIAGNOSIS — Z87891 Personal history of nicotine dependence: Secondary | ICD-10-CM

## 2014-08-24 DIAGNOSIS — I252 Old myocardial infarction: Secondary | ICD-10-CM

## 2014-08-24 DIAGNOSIS — I10 Essential (primary) hypertension: Secondary | ICD-10-CM | POA: Diagnosis present

## 2014-08-24 DIAGNOSIS — E119 Type 2 diabetes mellitus without complications: Secondary | ICD-10-CM

## 2014-08-24 DIAGNOSIS — R000 Tachycardia, unspecified: Secondary | ICD-10-CM | POA: Diagnosis present

## 2014-08-24 DIAGNOSIS — F329 Major depressive disorder, single episode, unspecified: Secondary | ICD-10-CM | POA: Diagnosis present

## 2014-08-24 DIAGNOSIS — E559 Vitamin D deficiency, unspecified: Secondary | ICD-10-CM | POA: Diagnosis present

## 2014-08-24 DIAGNOSIS — Z95820 Peripheral vascular angioplasty status with implants and grafts: Secondary | ICD-10-CM | POA: Diagnosis not present

## 2014-08-24 DIAGNOSIS — E785 Hyperlipidemia, unspecified: Secondary | ICD-10-CM | POA: Diagnosis present

## 2014-08-24 DIAGNOSIS — Z8249 Family history of ischemic heart disease and other diseases of the circulatory system: Secondary | ICD-10-CM

## 2014-08-24 DIAGNOSIS — K5520 Angiodysplasia of colon without hemorrhage: Secondary | ICD-10-CM | POA: Diagnosis present

## 2014-08-24 DIAGNOSIS — J449 Chronic obstructive pulmonary disease, unspecified: Secondary | ICD-10-CM | POA: Diagnosis present

## 2014-08-24 DIAGNOSIS — J309 Allergic rhinitis, unspecified: Secondary | ICD-10-CM | POA: Diagnosis present

## 2014-08-24 DIAGNOSIS — I2510 Atherosclerotic heart disease of native coronary artery without angina pectoris: Secondary | ICD-10-CM | POA: Diagnosis present

## 2014-08-24 DIAGNOSIS — K5730 Diverticulosis of large intestine without perforation or abscess without bleeding: Secondary | ICD-10-CM | POA: Diagnosis present

## 2014-08-24 DIAGNOSIS — I5032 Chronic diastolic (congestive) heart failure: Secondary | ICD-10-CM | POA: Diagnosis present

## 2014-08-24 DIAGNOSIS — Z86010 Personal history of colonic polyps: Secondary | ICD-10-CM

## 2014-08-24 LAB — COMPREHENSIVE METABOLIC PANEL
ALK PHOS: 136 U/L — AB (ref 39–117)
ALT: 12 U/L (ref 0–53)
ANION GAP: 13 (ref 5–15)
AST: 16 U/L (ref 0–37)
Albumin: 3.4 g/dL — ABNORMAL LOW (ref 3.5–5.2)
BILIRUBIN TOTAL: 0.3 mg/dL (ref 0.3–1.2)
BUN: 43 mg/dL — AB (ref 6–23)
CHLORIDE: 99 meq/L (ref 96–112)
CO2: 27 meq/L (ref 19–32)
Calcium: 9.7 mg/dL (ref 8.4–10.5)
Creatinine, Ser: 0.98 mg/dL (ref 0.50–1.35)
GFR, EST NON AFRICAN AMERICAN: 80 mL/min — AB (ref >90)
Glucose, Bld: 220 mg/dL — ABNORMAL HIGH (ref 70–99)
Potassium: 4.8 mEq/L (ref 3.7–5.3)
Sodium: 139 mEq/L (ref 137–147)
Total Protein: 7.2 g/dL (ref 6.0–8.3)

## 2014-08-24 LAB — POC OCCULT BLOOD, ED: Fecal Occult Bld: POSITIVE — AB

## 2014-08-24 LAB — CBC WITH DIFFERENTIAL/PLATELET
Basophils Absolute: 0.2 10*3/uL — ABNORMAL HIGH (ref 0.0–0.1)
Basophils Relative: 1 % (ref 0–1)
EOS ABS: 0.2 10*3/uL (ref 0.0–0.7)
Eosinophils Relative: 1 % (ref 0–5)
HCT: 39.6 % (ref 39.0–52.0)
Hemoglobin: 12.5 g/dL — ABNORMAL LOW (ref 13.0–17.0)
Lymphocytes Relative: 15 % (ref 12–46)
Lymphs Abs: 2.3 10*3/uL (ref 0.7–4.0)
MCH: 29.8 pg (ref 26.0–34.0)
MCHC: 31.6 g/dL (ref 30.0–36.0)
MCV: 94.5 fL (ref 78.0–100.0)
Monocytes Absolute: 1.1 10*3/uL — ABNORMAL HIGH (ref 0.1–1.0)
Monocytes Relative: 7 % (ref 3–12)
NEUTROS ABS: 11.7 10*3/uL — AB (ref 1.7–7.7)
Neutrophils Relative %: 76 % (ref 43–77)
Platelets: 202 10*3/uL (ref 150–400)
RBC: 4.19 MIL/uL — ABNORMAL LOW (ref 4.22–5.81)
RDW: 14.6 % (ref 11.5–15.5)
WBC: 15.5 10*3/uL — ABNORMAL HIGH (ref 4.0–10.5)

## 2014-08-24 LAB — CBC
HEMATOCRIT: 36.8 % — AB (ref 39.0–52.0)
HEMOGLOBIN: 11.6 g/dL — AB (ref 13.0–17.0)
MCH: 29.4 pg (ref 26.0–34.0)
MCHC: 31.5 g/dL (ref 30.0–36.0)
MCV: 93.4 fL (ref 78.0–100.0)
Platelets: 171 10*3/uL (ref 150–400)
RBC: 3.94 MIL/uL — ABNORMAL LOW (ref 4.22–5.81)
RDW: 14.6 % (ref 11.5–15.5)
WBC: 12.7 10*3/uL — AB (ref 4.0–10.5)

## 2014-08-24 LAB — URINALYSIS, ROUTINE W REFLEX MICROSCOPIC
BILIRUBIN URINE: NEGATIVE
Glucose, UA: NEGATIVE mg/dL
HGB URINE DIPSTICK: NEGATIVE
KETONES UR: NEGATIVE mg/dL
Leukocytes, UA: NEGATIVE
Nitrite: NEGATIVE
Protein, ur: NEGATIVE mg/dL
Specific Gravity, Urine: 1.022 (ref 1.005–1.030)
UROBILINOGEN UA: 0.2 mg/dL (ref 0.0–1.0)
pH: 6 (ref 5.0–8.0)

## 2014-08-24 LAB — PROTIME-INR
INR: 1.05 (ref 0.00–1.49)
Prothrombin Time: 13.8 seconds (ref 11.6–15.2)

## 2014-08-24 LAB — PRO B NATRIURETIC PEPTIDE: PRO B NATRI PEPTIDE: 218.8 pg/mL — AB (ref 0–125)

## 2014-08-24 LAB — PREPARE RBC (CROSSMATCH)

## 2014-08-24 LAB — I-STAT CG4 LACTIC ACID, ED: Lactic Acid, Venous: 2.28 mmol/L — ABNORMAL HIGH (ref 0.5–2.2)

## 2014-08-24 LAB — APTT: aPTT: 26 seconds (ref 24–37)

## 2014-08-24 LAB — TROPONIN I

## 2014-08-24 MED ORDER — ALBUTEROL SULFATE HFA 108 (90 BASE) MCG/ACT IN AERS: 2.0000 | INHALATION_SPRAY | Freq: Four times a day (QID) | RESPIRATORY_TRACT | Status: DC | PRN

## 2014-08-24 MED ORDER — BUDESONIDE-FORMOTEROL FUMARATE 160-4.5 MCG/ACT IN AERO
2.0000 | INHALATION_SPRAY | Freq: Two times a day (BID) | RESPIRATORY_TRACT | Status: DC
Start: 2014-08-25 — End: 2014-08-25
  Filled 2014-08-24: qty 6

## 2014-08-24 MED ORDER — INSULIN ASPART 100 UNIT/ML ~~LOC~~ SOLN
0.0000 [IU] | Freq: Three times a day (TID) | SUBCUTANEOUS | Status: DC
  Administered 2014-08-25: 1 [IU] via SUBCUTANEOUS

## 2014-08-24 MED ORDER — CHLORHEXIDINE GLUCONATE 0.12 % MT SOLN
OROMUCOSAL | Status: AC
  Administered 2014-08-24: 15 mL via ORAL
  Filled 2014-08-24: qty 15

## 2014-08-24 MED ORDER — ALBUTEROL SULFATE (2.5 MG/3ML) 0.083% IN NEBU: 2.5000 mg | INHALATION_SOLUTION | Freq: Four times a day (QID) | RESPIRATORY_TRACT | Status: DC | PRN

## 2014-08-24 MED ORDER — HYDROCODONE-ACETAMINOPHEN 5-325 MG PO TABS
1.0000 | ORAL_TABLET | Freq: Two times a day (BID) | ORAL | Status: DC | PRN
  Administered 2014-08-25: 1 via ORAL
  Filled 2014-08-24: qty 1

## 2014-08-24 MED ORDER — ZOLPIDEM TARTRATE 5 MG PO TABS: 5.0000 mg | ORAL_TABLET | Freq: Every evening | ORAL | Status: DC | PRN

## 2014-08-24 MED ORDER — CLOBETASOL PROPIONATE 0.05 % EX OINT
1.0000 "application " | TOPICAL_OINTMENT | Freq: Two times a day (BID) | CUTANEOUS | Status: DC | PRN
  Filled 2014-08-24: qty 15

## 2014-08-24 MED ORDER — SODIUM CHLORIDE 0.9 % IV SOLN
INTRAVENOUS | Status: DC
  Administered 2014-08-24: 1000 mL via INTRAVENOUS

## 2014-08-24 MED ORDER — SODIUM CHLORIDE 0.9 % IV BOLUS (SEPSIS)
1000.0000 mL | Freq: Once | INTRAVENOUS | Status: AC
Start: 2014-08-24 — End: 2014-08-24
  Administered 2014-08-24: 1000 mL via INTRAVENOUS

## 2014-08-24 MED ORDER — SODIUM CHLORIDE 0.9 % IV SOLN
80.0000 mg | Freq: Once | INTRAVENOUS | Status: AC
  Administered 2014-08-24: 80 mg via INTRAVENOUS
  Filled 2014-08-24: qty 80

## 2014-08-24 MED ORDER — SODIUM CHLORIDE 0.9 % IJ SOLN
3.0000 mL | Freq: Two times a day (BID) | INTRAMUSCULAR | Status: DC
  Administered 2014-08-24: 3 mL via INTRAVENOUS

## 2014-08-24 MED ORDER — METOPROLOL TARTRATE 25 MG PO TABS
25.0000 mg | ORAL_TABLET | Freq: Two times a day (BID) | ORAL | Status: DC
  Administered 2014-08-24 – 2014-08-25 (×2): 25 mg via ORAL
  Filled 2014-08-24 (×2): qty 1

## 2014-08-24 MED ORDER — ALPRAZOLAM 0.5 MG PO TABS
0.5000 mg | ORAL_TABLET | Freq: Three times a day (TID) | ORAL | Status: DC | PRN
  Administered 2014-08-24: 0.5 mg via ORAL
  Filled 2014-08-24: qty 1

## 2014-08-24 MED ORDER — PANTOPRAZOLE SODIUM 40 MG IV SOLR
40.0000 mg | Freq: Two times a day (BID) | INTRAVENOUS | Status: DC
  Administered 2014-08-24 – 2014-08-25 (×2): 40 mg via INTRAVENOUS
  Filled 2014-08-24 (×3): qty 40

## 2014-08-24 MED ORDER — ZOLPIDEM TARTRATE 10 MG PO TABS: 10.0000 mg | ORAL_TABLET | Freq: Every evening | ORAL | Status: DC | PRN

## 2014-08-24 MED ORDER — ATORVASTATIN CALCIUM 40 MG PO TABS
40.0000 mg | ORAL_TABLET | Freq: Every day | ORAL | Status: DC
  Administered 2014-08-24: 40 mg via ORAL
  Filled 2014-08-24 (×3): qty 1

## 2014-08-24 MED ORDER — CITALOPRAM HYDROBROMIDE 40 MG PO TABS
40.0000 mg | ORAL_TABLET | Freq: Every day | ORAL | Status: DC
  Administered 2014-08-24 – 2014-08-25 (×2): 40 mg via ORAL
  Filled 2014-08-24: qty 2
  Filled 2014-08-24: qty 1
  Filled 2014-08-24: qty 2

## 2014-08-24 MED ORDER — SODIUM CHLORIDE 0.9 % IV SOLN: Freq: Once | INTRAVENOUS | Status: DC

## 2014-08-24 MED ORDER — TIOTROPIUM BROMIDE MONOHYDRATE 18 MCG IN CAPS
18.0000 ug | ORAL_CAPSULE | Freq: Every day | RESPIRATORY_TRACT | Status: DC
  Administered 2014-08-25: 18 ug via RESPIRATORY_TRACT
  Filled 2014-08-24: qty 5
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
## 2014-08-24 NOTE — Plan of Care (Signed)
Problem: Phase I Progression Outcomes Goal: OOB as tolerated unless otherwise ordered Outcome: Not Applicable Date Met:  80/99/83 Pt was able to ambulate from the stretcher to the bed with no assist and no SOB

## 2014-08-24 NOTE — ED Notes (Signed)
Pt states he has had internal bleeding before and is now having black, hard stools and feels weak. Also has abdominal pain. Alert and oriented.

## 2014-08-24 NOTE — ED Notes (Signed)
Pt alert and oriented x4. Respirations even and unlabored, bilateral symmetrical rise and fall of chest. Skin warm and dry. In no acute distress. Denies needs.   

## 2014-08-25 ENCOUNTER — Telehealth: Payer: Self-pay

## 2014-08-25 DIAGNOSIS — K921 Melena: Secondary | ICD-10-CM | POA: Insufficient documentation

## 2014-08-25 DIAGNOSIS — F329 Major depressive disorder, single episode, unspecified: Secondary | ICD-10-CM

## 2014-08-25 DIAGNOSIS — J439 Emphysema, unspecified: Secondary | ICD-10-CM

## 2014-08-25 DIAGNOSIS — E559 Vitamin D deficiency, unspecified: Secondary | ICD-10-CM

## 2014-08-25 LAB — CBC
HCT: 34.2 % — ABNORMAL LOW (ref 39.0–52.0)
HCT: 38.3 % — ABNORMAL LOW (ref 39.0–52.0)
HEMOGLOBIN: 12.4 g/dL — AB (ref 13.0–17.0)
Hemoglobin: 10.9 g/dL — ABNORMAL LOW (ref 13.0–17.0)
MCH: 29.5 pg (ref 26.0–34.0)
MCH: 29.5 pg (ref 26.0–34.0)
MCHC: 31.9 g/dL (ref 30.0–36.0)
MCHC: 32.4 g/dL (ref 30.0–36.0)
MCV: 91.2 fL (ref 78.0–100.0)
MCV: 92.7 fL (ref 78.0–100.0)
Platelets: 172 10*3/uL (ref 150–400)
Platelets: 176 10*3/uL (ref 150–400)
RBC: 3.69 MIL/uL — AB (ref 4.22–5.81)
RBC: 4.2 MIL/uL — ABNORMAL LOW (ref 4.22–5.81)
RDW: 14.7 % (ref 11.5–15.5)
RDW: 15.5 % (ref 11.5–15.5)
WBC: 11 10*3/uL — AB (ref 4.0–10.5)
WBC: 9.6 10*3/uL (ref 4.0–10.5)

## 2014-08-25 LAB — LACTIC ACID, PLASMA: Lactic Acid, Venous: 1.4 mmol/L (ref 0.5–2.2)

## 2014-08-25 LAB — GLUCOSE, CAPILLARY: GLUCOSE-CAPILLARY: 150 mg/dL — AB (ref 70–99)

## 2014-08-25 LAB — COMPREHENSIVE METABOLIC PANEL
ALT: 11 U/L (ref 0–53)
AST: 12 U/L (ref 0–37)
Albumin: 3.3 g/dL — ABNORMAL LOW (ref 3.5–5.2)
Alkaline Phosphatase: 120 U/L — ABNORMAL HIGH (ref 39–117)
Anion gap: 14 (ref 5–15)
BUN: 32 mg/dL — ABNORMAL HIGH (ref 6–23)
CO2: 26 meq/L (ref 19–32)
CREATININE: 0.87 mg/dL (ref 0.50–1.35)
Calcium: 9.1 mg/dL (ref 8.4–10.5)
Chloride: 105 mEq/L (ref 96–112)
GFR calc Af Amer: >90 mL/min (ref >90)
GFR, EST NON AFRICAN AMERICAN: 84 mL/min — AB (ref >90)
GLUCOSE: 145 mg/dL — AB (ref 70–99)
Potassium: 4 mEq/L (ref 3.7–5.3)
Sodium: 145 mEq/L (ref 137–147)
Total Bilirubin: 0.6 mg/dL (ref 0.3–1.2)
Total Protein: 6.8 g/dL (ref 6.0–8.3)

## 2014-08-25 LAB — MRSA PCR SCREENING: MRSA by PCR: NEGATIVE

## 2014-08-25 LAB — HEMOGLOBIN A1C
HEMOGLOBIN A1C: 7.8 % — AB (ref <5.7)
MEAN PLASMA GLUCOSE: 177 mg/dL — AB (ref <117)

## 2014-08-25 LAB — TROPONIN I

## 2014-08-25 MED ORDER — POLYETHYLENE GLYCOL 3350 17 G PO PACK: 17.0000 g | PACK | Freq: Every day | ORAL | Status: DC

## 2014-08-25 MED ORDER — ACETAMINOPHEN 325 MG PO TABS
650.0000 mg | ORAL_TABLET | Freq: Four times a day (QID) | ORAL | Status: DC | PRN
  Administered 2014-08-25: 650 mg via ORAL
  Filled 2014-08-25: qty 2

## 2014-08-25 MED ORDER — ZOLPIDEM TARTRATE 5 MG PO TABS: 5.0000 mg | ORAL_TABLET | Freq: Every evening | ORAL | Status: DC | PRN

## 2014-08-25 MED ORDER — SODIUM CHLORIDE 0.9 % IV SOLN: INTRAVENOUS | Status: DC

## 2014-08-25 NOTE — Progress Notes (Signed)
Pt had one bowel movement since arrival, green black in color consistent with iron intake. Pt really pushed and strained hard, he possibly could benefit from a stool softner.

## 2014-08-25 NOTE — Progress Notes (Signed)
Blood transfusion reactivated, order was modified which cancelled the transfusion that was already in progress.
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
## 2014-08-25 NOTE — Telephone Encounter (Signed)
Patient just got out of hospital and needs to speak with Dr Brigitte Pulse.

## 2014-08-25 NOTE — Discharge Summary (Addendum)
[facility] PROGRESS NOTE  Dorrien Grunder [id number]:[id number] DOB: 08/10/1941 DOA: 08/24/2014 PCP: Delman Cheadle, MD  Physician Discharge Summary  [name] [id number]:[id number] DOB: 1940/12/24 DOA: 08/24/2014  PCP: Delman Cheadle, MD  Admit date: 08/24/2014 Discharge date: 08/25/2014  Time spent: < 30 minutes  Recommendations for Outpatient Follow-up:  CBC to follow Hgb trend Reassess BP and adjust medications as needed Patient needs to follow at [facility] for enteroscopy procedure (better evaluation of small intestine as source of bleeding)  Discharge Diagnoses:  Principal Problem:   GI bleeding Active Problems:   Essential hypertension   Diabetes mellitus type II, controlled   COPD (chronic obstructive pulmonary disease)   Anemia   Diastolic HF (heart failure)   CAD (coronary artery disease)   AAA (abdominal aortic aneurysm)   Depression   Vitamin D deficiency   GIB (gastrointestinal bleeding)   Discharge Condition: stable and improved. No CP, no SOB, patient has requested to be discharge as no endoscopic procedure offered.  Diet recommendation: heart healthy, low carb and low acidic diet  Filed Weights   08/24/14 2245  Weight: 117 kg (257 lb 15 oz)    History of present illness:  73 y.o. male With history of CAD status post CABG, DM on metformin, history of AAA, hyperlipidemia, hypertension, history of Cecal AVMs s/p APC, hx of gastric polyp bleeding (s/p polypectomy), who presents with black stools. Patient had tarry stools for 2 days prior to admission and reported associated lightheadedness and dizzy sensation. No hematemesis, no dysuria, no fever, no cough, no headache and blurred vision. Patient endorses mild epigastric discomfort and came to ED for further evaluation and treatment. Hgb on arrival 12.5   Hospital Course:  GI bleed and symptomatic anemia: likely due to upper GIB. In cosistent with this diagonsis, he has tarry black stool with increased BUN at 43.  -GI  consulted (unfortunately; patient has had multiple endoscopic procedures, capsule enteroscopy and also colonoscopies; none of them has truly proved to found source of bleeding). Plan is to treat conservative with IV iron infusion and PRN transfusions. -after discussing with patient he decided to go home and requested to be discharge -he will follow with [facility] for further needs (as they can evaluate better his small intestine, which is an area that has not been able to be looked at properly here) -continue PPI -continue  -patient received 1 unit of PRBC and will continue iron supplementation at discharge.  HYPERLIPIDEMIA - Continue Lipitor 40 mg daily  HYPERTENSION - Continue home antihypertensive regimen; except for his lisinopril  CAD (coronary artery disease): had mild chest pain on presentation; subsequently resolved after receiving PPI - continue BB and statins - trop x 3 - EKG w/o ischemic changes  COPD: stable -continue home inhaler meds -continue O2 supplementation  DM-II: A1c was 5.6 on 04/30/14.  -continue home hypoglycemic regimen. -follow low carb diet  Vit D deficiency -continue vit D supplementation as instructed by PCP  AAA (abdominal aortic aneurysm) - Continue beta blocker and follow as instructed by vascular for check up and further evaluation  Depression and anxiety: stable.  -continue celexa and PRN xanax  Procedures: None   Consultations:  GI (Dr. Benson Norway)  Discharge Exam: Filed Vitals:   08/25/14 1105  BP: 132/72  Pulse: 82  Temp: 98 F (36.7 C)  Resp: 20    General:  AAOX3, no CP and w/o SOB. No fever. Patient had another BM overnight (mildly dark in appearance).  Cardiovascular: S1 and S2, no rubs or gallops.  Regular rate  Respiratory: slight end exp wheezing (according to patient not new); good air movement  Abdomen: soft, NT, ND, positive BS  Musculoskeletal: no edema, no cyanosis or clubbing   Discharge Instructions You  were cared for by a hospitalist during your hospital stay. If you have any questions about your discharge medications or the care you received while you were in the hospital after you are discharged, you can call the unit and asked to speak with the hospitalist on call if the hospitalist that took care of you is not available. Once you are discharged, your primary care physician will handle any further medical issues. Please note that NO REFILLS for any discharge medications will be authorized once you are discharged, as it is imperative that you return to your primary care physician (or establish a relationship with a primary care physician if you do not have one) for your aftercare needs so that they can reassess your need for medications and monitor your lab values.   Current Discharge Medication List    START taking these medications   Details  polyethylene glycol (MIRALAX / GLYCOLAX) packet Take 17 g by mouth daily. Qty: 24 each, Refills: 0      CONTINUE these medications which have NOT CHANGED   Details  ALPRAZolam (XANAX) 0.5 MG tablet Take 1 tablet (0.5 mg total) by mouth 3 (three) times daily as needed for anxiety (for shortness of breath). Qty: 20 tablet, Refills: 2    aluminum hydroxide-magnesium carbonate (GAVISCON) 95-358 MG/15ML SUSP Take 15 mLs by mouth daily as needed for indigestion (indigestion).    atorvastatin (LIPITOR) 40 MG tablet Take 1 tablet (40 mg total) by mouth at bedtime. Qty: 90 tablet, Refills: 11    budesonide-formoterol (SYMBICORT) 160-4.5 MCG/ACT inhaler Inhale 2 puffs into the lungs 2 (two) times daily. Qty: 1 Inhaler, Refills: 0    Calcium Carbonate Antacid (ROLAIDS EXTRA STRENGTH PO) Take 2 tablets by mouth daily as needed (indigestion).    citalopram (CELEXA) 40 MG tablet Take 1 tablet (40 mg total) by mouth daily. Qty: 30 tablet, Refills: 5    clobetasol ointment (TEMOVATE) 3.66 % Apply 1 application topically 2 (two) times daily as needed (rash).      ergocalciferol (VITAMIN D2) 50000 UNITS capsule Take 1 capsule (50,000 Units total) by mouth once a week. Qty: 12 capsule, Refills: 2    folic acid (FOLVITE) 1 MG tablet Take 2 tablets (2 mg total) by mouth every morning. Qty: 60 tablet, Refills: 11    HYDROcodone-acetaminophen (NORCO/VICODIN) 5-325 MG per tablet Take 1 tablet by mouth every 12 (twelve) hours as needed for moderate pain. Qty: 60 tablet, Refills: 0    iron polysaccharides (NIFEREX) 150 MG capsule Take 150 mg by mouth 2 (two) times daily.    metFORMIN (GLUCOPHAGE) 500 MG tablet Take 1 tablet (500 mg total) by mouth 2 (two) times daily with a meal. Qty: 180 tablet, Refills: 0    metoprolol tartrate (LOPRESSOR) 25 MG tablet Take 1 tablet (25 mg total) by mouth 2 (two) times daily. Qty: 60 tablet, Refills: 11    pantoprazole (PROTONIX) 40 MG tablet Take 40 mg by mouth 2 (two) times daily.    Pyridoxine HCl (VITAMIN B6 PO) Take 1 tablet by mouth daily.    tiotropium (SPIRIVA) 18 MCG inhalation capsule Place 1 capsule (18 mcg total) into inhaler and inhale daily. Qty: 30 capsule, Refills: 3    albuterol (PROVENTIL HFA;VENTOLIN HFA) 108 (90 BASE) MCG/ACT inhaler Inhale 2 puffs  into the lungs every 6 (six) hours as needed for wheezing.    zolpidem (AMBIEN) 10 MG tablet TAKE 1 TABLET BY MOUTH AT BEDTIME AS NEEDED FOR SLEEP Qty: 30 tablet, Refills: 3      STOP taking these medications     lisinopril (PRINIVIL,ZESTRIL) 5 MG tablet      zoster vaccine live, PF, (ZOSTAVAX) 72620 UNT/0.65ML injection        Allergies  Allergen Reactions  . Penicillins Anaphylaxis and Hives  . Demerol [Meperidine] Other (See Comments)    hallucinations  . Dilaudid [Hydromorphone Hcl] Other (See Comments)    hallucinations  . Levaquin [Levofloxacin In D5w]     Nausea and diarrhea  . Morphine And Related Nausea Only and Other (See Comments)    hallucinations      The results of significant diagnostics from this hospitalization  (including imaging, microbiology, ancillary and laboratory) are listed below for reference.    Significant Diagnostic Studies: No results found.  Microbiology: Recent Results (from the past 240 hour(s))  MRSA PCR Screening     Status: None   Collection Time: 08/24/14 10:24 PM  Result Value Ref Range Status   MRSA by PCR NEGATIVE NEGATIVE Final    Comment:        The GeneXpert MRSA Assay (FDA approved for NASAL specimens only), is one component of a comprehensive MRSA colonization surveillance program. It is not intended to diagnose MRSA infection nor to guide or monitor treatment for MRSA infections.      Labs: Basic Metabolic Panel:  Recent Labs Lab 08/24/14 1804 08/25/14 0645  NA 139 145  K 4.8 4.0  CL 99 105  CO2 27 26  GLUCOSE 220* 145*  BUN 43* 32*  CREATININE 0.98 0.87  CALCIUM 9.7 9.1   Liver Function Tests:  Recent Labs Lab 08/24/14 1804 08/25/14 0645  AST 16 12  ALT 12 11  ALKPHOS 136* 120*  BILITOT 0.3 0.6  PROT 7.2 6.8  ALBUMIN 3.4* 3.3*   CBC:  Recent Labs Lab 08/24/14 1804 08/24/14 2117 08/25/14 0013 08/25/14 0645  WBC 15.5* 12.7* 9.6 11.0*  NEUTROABS 11.7*  --   --   --   HGB 12.5* 11.6* 10.9* 12.4*  HCT 39.6 36.8* 34.2* 38.3*  MCV 94.5 93.4 92.7 91.2  PLT 202 171 176 172   Cardiac Enzymes:  Recent Labs Lab 08/24/14 2325 08/25/14 0600  TROPONINI <0.30 <0.30   BNP: BNP (last 3 results)  Recent Labs  09/07/13 2020 10/15/13 1534 08/24/14 2325  PROBNP 293.6* 487.1* 218.8*   CBG: No results for input(s): GLUCAP in the last 168 hours.  Signed:  Barton Dubois  [facility] 08/25/2014, 12:51 PM

## 2014-08-25 NOTE — Progress Notes (Signed)
Blood transfusion end time: 0400

## 2014-08-26 NOTE — Telephone Encounter (Signed)
[facility] won't see pt (and vice versa) as far as I am aware of.  If pt won't see Dr. Benson Norway than he will need to go to a different system/town. On 7/15 I referred pt to GI - Pt has an appt with dr rein at [facility] gastro on 06/19/14 at 2:00 [facility] authorization is on the portal - did he go to this? If so we need records.  If not, why not and can he reschedule there?

## 2014-08-26 NOTE — Telephone Encounter (Signed)
Spoke to [place] checked.  Pt refused care by Dr. Benson Norway while in the hospital. His Hgb was extremely high while in the hospital (22) pt still has black stools. He does not have a history of this.  Pt does not like Dr. Benson Norway and wants a new referral.  Advised pt wife to have pt RTC to see Dr. Brigitte Pulse today. [name] states they will be in by 2pm.

## 2014-08-27 NOTE — Telephone Encounter (Signed)
[name] stated the pt was not feeling well and decided to stay home.  Dr. Erlene Quan in [place] or [place]- he wants the referral changed to go see this Dr. He has been to him in the past and has no issues with him.

## 2014-08-28 LAB — TYPE AND SCREEN
ABO/RH(D): B NEG
Antibody Screen: NEGATIVE
UNIT DIVISION: 0
Unit division: 0

## 2014-08-29 ENCOUNTER — Telehealth: Payer: Self-pay

## 2014-08-29 DIAGNOSIS — K921 Melena: Secondary | ICD-10-CM

## 2014-08-29 NOTE — Telephone Encounter (Signed)
Patient called to check that status of the order for Dr Isaias Sakai in [facility] or [place].

## 2014-09-02 ENCOUNTER — Telehealth: Payer: Self-pay

## 2014-09-02 NOTE — Telephone Encounter (Signed)
Pt is needing a refill on ambien as soon as possible he took his last one last night and cant sleep with out it   Best number [phone] when ready to pick it up

## 2014-09-03 ENCOUNTER — Encounter: Payer: Self-pay | Admitting: Pulmonary Disease

## 2014-09-03 ENCOUNTER — Ambulatory Visit (INDEPENDENT_AMBULATORY_CARE_PROVIDER_SITE_OTHER): Payer: Commercial Managed Care - HMO | Admitting: Pulmonary Disease

## 2014-09-03 VITALS — BP 108/70 | HR 79 | Ht 75.0 in | Wt 258.0 lb

## 2014-09-03 DIAGNOSIS — J439 Emphysema, unspecified: Secondary | ICD-10-CM

## 2014-09-03 NOTE — Telephone Encounter (Signed)
LM for pt to rtn call to schedule appt-contact pharmacy for refills.

## 2014-09-03 NOTE — Patient Instructions (Signed)
Keep taking your medications as you are doing We will see you back in 6 months or sooner if needed

## 2014-09-03 NOTE — Telephone Encounter (Signed)
Pt was written for a 4 month supply of ambien 10mg  on 9/25 during OV. No ambien refill indicated until 1/20 - he will need OV at that time for refill of this controlled med. Pt was also told that he needed to come in for evaluation and f/u after his recent hospitalization which he has not.

## 2014-09-03 NOTE — Assessment & Plan Note (Signed)
This has been a stable interval for [name] in terms of his COPD.  Plan: -Continue Symbicort and Spiriva -Flu shot is up-to-date -Followup 6 months

## 2014-09-03 NOTE — Progress Notes (Signed)
Subjective:    Patient ID: [name], male    DOB: [birth year], 73 y.o.   MRN: [id number]  Synopsis: Severe COPD per FEV1 44% in 2011.  Quit smoking in early 2011 after long smoking history.  Started on O2 in 2013 with chronic GI bleeding.  HPI  Chief Complaint  Patient presents with  . Follow-up    Pt c/o sob with exertion.  chest tightness with exertion.  CAT score 31.   09/03/2014 ROV > [name] has been having a hard time lately with his gastrointestinal bleeding lately.  He switched GI doctors recently and underwent a pill endoscopy.  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  He says that his breahting hsa been pretty much the same since I last saw him over a year ago.  Minimal to no cough.  He still uses symbicort twice a day no matter what and Spiriva daily (prn). He mostly struggles with dyspnea because of the bleeding, he thinks.   Past Medical History  Diagnosis Date  . Allergic rhinitis, cause unspecified   . Other and unspecified hyperlipidemia   . 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 Unspecified essential hypertension   . 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 Severe bullous disease of LUL. Prolonged intubation after surgeries due to COPD  . 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 Anemia     Recurrent microcytic, presumably GI   . Complication of anesthesia     trouble getting off ventilator  . Memory loss   . Diabetes   . CHF (congestive heart failure)   . CAD (coronary artery disease)   . GI bleed requiring more than 4 units of blood in 24 hours, ICU, or surgery     Colonoscopy by Dr. 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 site of blood loss unable to  be identified on numerous upper, lower, and capsule endoscopies     Review of Systems  Constitutional: Negative for fever, chills, activity change, appetite change and unexpected weight change.  HENT: Negative for congestion, postnasal drip, rhinorrhea and sneezing.   Respiratory: Positive for shortness of breath. Negative for cough and choking.   Cardiovascular: Positive for chest pain. Negative for leg swelling.       Objective:   Physical Exam   Filed Vitals:   09/03/14 1536  BP: 108/70  Pulse: 79  Height: 6\' 3"  (1.905 m)  Weight: 258 lb (117.028 kg)  SpO2: 94%  O2 2 L  Gen: chronically ill appearing, no acute distress HEENT: NCAT, EOMi, OP clear, neck supple without masses PULM: No wheezing, CTA today CV: RRR, no mgr, no JVD AB: BS+, soft, nontender, no hsm Ext: warm, no edema, no clubbing, no cyanosis  2010 simple spirometry with FEV1 of 1.41L 44% predicted clear obstruction and flow volume loop 2014 simple spirometry FEV1 1.3 L, clear obstruction     Assessment & Plan:   COPD (chronic obstructive pulmonary disease) This has been a stable interval for [name] in terms of his COPD.  Plan: -Continue Symbicort and Spiriva -Flu shot is up-to-date -Followup 6 months    Updated Medication List Outpatient Encounter Prescriptions as of 09/03/2014  Medication Sig  . albuterol (PROVENTIL  HFA;VENTOLIN HFA) 108 (90 BASE) MCG/ACT inhaler Inhale 2 puffs into the lungs every 6 (six) hours as needed for wheezing.  Marland Kitchen ALPRAZolam (XANAX) 0.5 MG tablet Take 1 tablet (0.5 mg total) by mouth 3 (three) times daily as needed for anxiety (for shortness of breath).  Marland Kitchen aluminum hydroxide-magnesium carbonate (GAVISCON) 95-358 MG/15ML SUSP Take 15 mLs by mouth daily as needed for indigestion (indigestion).  Marland Kitchen atorvastatin (LIPITOR) 40 MG tablet Take 1 tablet (40 mg total) by mouth at bedtime.  . budesonide-formoterol (SYMBICORT) 160-4.5 MCG/ACT inhaler Inhale 2 puffs into the lungs 2  (two) times daily.  . Calcium Carbonate Antacid (ROLAIDS EXTRA STRENGTH PO) Take 2 tablets by mouth daily as needed (indigestion).  . citalopram (CELEXA) 40 MG tablet Take 1 tablet (40 mg total) by mouth daily.  . clobetasol ointment (TEMOVATE) 6.76 % Apply 1 application topically 2 (two) times daily as needed (rash).   . ergocalciferol (VITAMIN D2) 50000 UNITS capsule Take 1 capsule (50,000 Units total) by mouth once a week.  . folic acid (FOLVITE) 1 MG tablet Take 2 tablets (2 mg total) by mouth every morning.  Marland Kitchen HYDROcodone-acetaminophen (NORCO/VICODIN) 5-325 MG per tablet Take 1 tablet by mouth every 12 (twelve) hours as needed for moderate pain.  . iron polysaccharides (NIFEREX) 150 MG capsule Take 150 mg by mouth 2 (two) times daily.  . metFORMIN (GLUCOPHAGE) 500 MG tablet Take 1 tablet (500 mg total) by mouth 2 (two) times daily with a meal.  . metoprolol tartrate (LOPRESSOR) 25 MG tablet Take 1 tablet (25 mg total) by mouth 2 (two) times daily.  . pantoprazole (PROTONIX) 40 MG tablet Take 40 mg by mouth 2 (two) times daily.  . polyethylene glycol (MIRALAX / GLYCOLAX) packet Take 17 g by mouth daily.  . Pyridoxine HCl (VITAMIN B6 PO) Take 1 tablet by mouth daily.  Marland Kitchen tiotropium (SPIRIVA) 18 MCG inhalation capsule Place 1 capsule (18 mcg total) into inhaler and inhale daily.  Marland Kitchen zolpidem (AMBIEN) 10 MG tablet TAKE 1 TABLET BY MOUTH AT BEDTIME AS NEEDED FOR SLEEP

## 2014-09-15 ENCOUNTER — Telehealth: Payer: Self-pay | Admitting: Pulmonary Disease

## 2014-09-15 MED ORDER — BUDESONIDE-FORMOTEROL FUMARATE 160-4.5 MCG/ACT IN AERO: 2.0000 | INHALATION_SPRAY | Freq: Two times a day (BID) | RESPIRATORY_TRACT | Status: DC

## 2014-09-15 NOTE — Telephone Encounter (Signed)
Spoke with pt, just needs symbicort called in.  Med refilled, nothing further needed.

## 2014-10-01 ENCOUNTER — Other Ambulatory Visit: Payer: Self-pay | Admitting: Family Medicine

## 2014-10-02 ENCOUNTER — Telehealth: Payer: Self-pay | Admitting: Family Medicine

## 2014-10-02 NOTE — Telephone Encounter (Signed)
Received refill request for xanax. Denied as pt is long overdue to an appointment.  He was supposed to be in a month ago for his hospital f/u visit.

## 2014-10-02 NOTE — Telephone Encounter (Signed)
Refill was sent in on accident. Pt has appt tomorrow with Dr. Brigitte Pulse.

## 2014-10-03 ENCOUNTER — Ambulatory Visit: Payer: Commercial Managed Care - HMO | Admitting: Family Medicine

## 2014-10-23 ENCOUNTER — Telehealth: Payer: Self-pay | Admitting: Pulmonary Disease

## 2014-10-23 MED ORDER — BUDESONIDE-FORMOTEROL FUMARATE 160-4.5 MCG/ACT IN AERO: 2.0000 | INHALATION_SPRAY | Freq: Two times a day (BID) | RESPIRATORY_TRACT | Status: DC

## 2014-10-23 NOTE — Telephone Encounter (Signed)
Spoke with pt and advised that refill for Symbicort was sent to pharmacy.  Nothing further needed.

## 2014-11-06 ENCOUNTER — Other Ambulatory Visit: Payer: Self-pay

## 2014-11-06 ENCOUNTER — Other Ambulatory Visit: Payer: Self-pay | Admitting: Physician Assistant

## 2014-11-06 MED ORDER — METFORMIN HCL 500 MG PO TABS: 500.0000 mg | ORAL_TABLET | Freq: Two times a day (BID) | ORAL | Status: DC

## 2014-11-11 ENCOUNTER — Other Ambulatory Visit: Payer: Self-pay | Admitting: Family Medicine

## 2014-11-17 ENCOUNTER — Ambulatory Visit (INDEPENDENT_AMBULATORY_CARE_PROVIDER_SITE_OTHER): Payer: Commercial Managed Care - HMO | Admitting: Family Medicine

## 2014-11-17 VITALS — BP 132/82 | HR 86 | Temp 97.6°F | Resp 18 | Ht 72.0 in | Wt 256.0 lb

## 2014-11-17 DIAGNOSIS — Z79899 Other long term (current) drug therapy: Secondary | ICD-10-CM

## 2014-11-17 DIAGNOSIS — G4700 Insomnia, unspecified: Secondary | ICD-10-CM

## 2014-11-17 DIAGNOSIS — Z9981 Dependence on supplemental oxygen: Secondary | ICD-10-CM

## 2014-11-17 DIAGNOSIS — K922 Gastrointestinal hemorrhage, unspecified: Secondary | ICD-10-CM

## 2014-11-17 DIAGNOSIS — J439 Emphysema, unspecified: Secondary | ICD-10-CM

## 2014-11-17 DIAGNOSIS — G894 Chronic pain syndrome: Secondary | ICD-10-CM

## 2014-11-17 DIAGNOSIS — D500 Iron deficiency anemia secondary to blood loss (chronic): Secondary | ICD-10-CM

## 2014-11-21 ENCOUNTER — Other Ambulatory Visit: Payer: Self-pay | Admitting: *Deleted

## 2014-11-21 ENCOUNTER — Encounter: Payer: Self-pay | Admitting: Surgery

## 2014-11-21 ENCOUNTER — Telehealth: Payer: Self-pay | Admitting: Radiology

## 2014-11-21 ENCOUNTER — Encounter: Payer: Commercial Managed Care - HMO | Admitting: Family Medicine

## 2014-11-21 DIAGNOSIS — Z01812 Encounter for preprocedural laboratory examination: Secondary | ICD-10-CM

## 2014-11-21 NOTE — Telephone Encounter (Signed)
Romie Minus from [facility] called. Wants to know if we can draw a BUN/CR on pt when he comes in to see you. He is scheduled for a CT scan. Thanks

## 2014-11-21 NOTE — Telephone Encounter (Signed)
Future order was entered and could have been drawn but pt cancelled his appt today - welcome to come in for a lab-only visit for this or go to [facility]. Of note, it looks like he is going to be due for medication refills soon and will need to be seen in clinic before we can refill those again.  He is welcome to schedule w/ me at 104 or can see a different provider at 102 for this if he prefers.

## 2014-11-24 ENCOUNTER — Ambulatory Visit: Payer: Commercial Managed Care - HMO | Admitting: Surgery

## 2014-11-24 ENCOUNTER — Inpatient Hospital Stay: Admission: RE | Admit: 2014-11-24 | Payer: Commercial Managed Care - HMO | Source: Ambulatory Visit

## 2014-11-25 ENCOUNTER — Encounter: Payer: Self-pay | Admitting: *Deleted

## 2014-11-25 DIAGNOSIS — D500 Iron deficiency anemia secondary to blood loss (chronic): Secondary | ICD-10-CM

## 2014-11-30 DIAGNOSIS — Z9981 Dependence on supplemental oxygen: Secondary | ICD-10-CM | POA: Insufficient documentation

## 2014-11-30 NOTE — Progress Notes (Signed)
This encounter was created in error - please disregard.

## 2014-11-30 NOTE — Progress Notes (Signed)
Subjective:  This chart was scribed for Delman Cheadle, MD by Jeanell Sparrow, ED Scribe. This patient was seen in room 12 and the patient's care was started at 6:05 PM.   Patient ID: [name], male    DOB: 28-Apr-1941, 74 y.o.   MRN: [id number] Chief Complaint  Patient presents with  . Fatigue   HPI HPI Comments: [name] is a 74 y.o. male who presents to the [facility] complaining of fatigue.   Complicated pt with comorbidity.  Recently established here due for multiple medication refills and lab tests. Not here for that, just here to address fatigue. Appointment vein and vascular and a CTA of his body 1 day from now.   Pt was seen by vascular surgeon in August, and no mention of blood work was made. Surgeon was found in [place].   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 He reports that he also needs a breathing test.   Pt is not fasting. Last hemoglobin A1C was within 3 months.  Pt was very verbally aggressive toward staff members. He refused to have door closed during encounter due claustrophobia.   Past Medical History  Diagnosis Date  . Allergic rhinitis, cause unspecified   . Other and unspecified hyperlipidemia   . 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 Unspecified essential hypertension   . 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 Severe bullous disease of LUL. Prolonged intubation after surgeries due to COPD  . 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 Anemia     Recurrent microcytic, presumably GI   . Complication of anesthesia     trouble getting off ventilator  . Memory loss   . Diabetes   . CHF (congestive heart failure)   . CAD (coronary artery disease)   . GI bleed requiring more than 4 units of blood in 24 hours, ICU, or surgery    Colonoscopy by Dr. 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 site of blood loss unable to be identified on numerous upper, lower, and capsule endoscopies   Allergies  Allergen Reactions  . Penicillins Anaphylaxis and Hives  . Demerol [Meperidine] Other (See Comments)    hallucinations  . Dilaudid [Hydromorphone Hcl] Other (See Comments)    hallucinations  . Levaquin [Levofloxacin In D5w]     Nausea and diarrhea  . Morphine And Related Nausea Only and Other (See Comments)    hallucinations   Current Outpatient Prescriptions on File Prior to Visit  Medication Sig Dispense Refill  . albuterol (PROVENTIL HFA;VENTOLIN HFA) 108 (90 BASE) MCG/ACT inhaler Inhale 2 puffs into the lungs every 6 (six) hours as needed for wheezing.    Marland Kitchen ALPRAZolam (XANAX) 0.5 MG tablet Take 1 tablet (0.5 mg total) by mouth 3 (three) times daily as needed for anxiety (for shortness of breath). 20 tablet 2  . aluminum hydroxide-magnesium carbonate (GAVISCON) 95-358 MG/15ML SUSP Take 15 mLs by mouth daily as needed for indigestion (indigestion).    Marland Kitchen atorvastatin (LIPITOR) 40 MG tablet Take 1 tablet (40 mg total) by mouth at bedtime. 90 tablet 11  . budesonide-formoterol (SYMBICORT) 160-4.5 MCG/ACT inhaler Inhale 2 puffs into the lungs 2 (two) times daily. 1 Inhaler 11  . Calcium Carbonate Antacid (ROLAIDS EXTRA  STRENGTH PO) Take 2 tablets by mouth daily as needed (indigestion).    . citalopram (CELEXA) 40 MG tablet Take 1 tablet (40 mg total) by mouth daily. 30 tablet 5  . clobetasol ointment (TEMOVATE) 2.22 % Apply 1 application topically 2 (two) times daily as needed (rash).     . ergocalciferol (VITAMIN D2) 50000 UNITS capsule Take 1 capsule (50,000 Units total) by mouth once a week. 12 capsule 2  . folic acid (FOLVITE) 1 MG tablet Take 2 tablets (2 mg total) by mouth every morning. 60 tablet 11  . HYDROcodone-acetaminophen (NORCO/VICODIN) 5-325 MG per tablet Take 1 tablet by  mouth every 12 (twelve) hours as needed for moderate pain. 60 tablet 0  . iron polysaccharides (NIFEREX) 150 MG capsule Take 150 mg by mouth 2 (two) times daily.    Marland Kitchen lisinopril (PRINIVIL,ZESTRIL) 5 MG tablet Take 1 tablet (5mg  total) by mouth daily.  "OV NEEDED FOR ADDITIONAL REFILLS" 30 tablet 0  . metFORMIN (GLUCOPHAGE) 500 MG tablet Take 1 tablet (500 mg total) by mouth 2 (two) times daily with a meal. PATIENT NEEDS OFFICE VISIT/LABS FOR ADDITIONAL REFILLS 60 tablet 0  . metoprolol tartrate (LOPRESSOR) 25 MG tablet Take 1 tablet (25 mg total) by mouth 2 (two) times daily. 60 tablet 11  . pantoprazole (PROTONIX) 40 MG tablet Take 40 mg by mouth 2 (two) times daily.    . polyethylene glycol (MIRALAX / GLYCOLAX) packet Take 17 g by mouth daily. 24 each 0  . Pyridoxine HCl (VITAMIN B6 PO) Take 1 tablet by mouth daily.    Marland Kitchen tiotropium (SPIRIVA) 18 MCG inhalation capsule Place 1 capsule (18 mcg total) into inhaler and inhale daily. 30 capsule 3  . zolpidem (AMBIEN) 10 MG tablet TAKE 1 TABLET BY MOUTH AT BEDTIME AS NEEDED FOR SLEEP 30 tablet 3   No current facility-administered medications on file prior to visit.   Review of Systems  Constitutional: Positive for fatigue.       Objective:   Physical Exam  Constitutional: He is oriented to person, place, and time. He appears well-developed and well-nourished. No distress.  Angry and agitated. Wearing home oxygen treatment. Gait appears normal.   HENT:  Head: Normocephalic and atraumatic.  Neck: Neck supple.  Cardiovascular: Normal rate.   Pulmonary/Chest: Effort normal. No respiratory distress.  Musculoskeletal: Normal range of motion.  Neurological: He is alert and oriented to person, place, and time.  Skin: Skin is warm and dry.  Psychiatric: He has a normal mood and affect. His behavior is normal.  Nursing note and vitals reviewed.  BP 132/82 mmHg  Pulse 86  Temp(Src) 97.6 F (36.4 C) (Oral)  Resp 18  Ht 6' (1.829 m)  Wt 256 lb  (116.121 kg)  BMI 34.71 kg/m2  SpO2 94%    Assessment & Plan:  [name] could not wait to be seen today - he was upset that it took 90 minutes for him to be seen - this was because it is Monday at 6 pm and so a high volume time. Pt had appt on 104 previously which he cancelled and has an appt sched w/ me next week at 104 as well.  Pt was very upset - felt like his other prior GI doctors (Dr. Erlene Quan in [place] "turned you against me" when I spoke with them - pt informed that I have never spoken to Dr. Erlene Quan - that I only read his c/s note which did not have anything negative in it.  Pt  yelled at me that I was lying and tried to storm out.  I informed pt that I would be willing to put our differences behind [place] so that we could proceed w/ his office visit and evaluation tonight - pt needs refills on several of his chronic medications including xanax which does require an OV.  He also brought in his medicare paperwork so that he can continue on home O2 - this requires O2 sats on RA at rest and RA w/ exertion as well as current oxygen level by [place] w/ exertion and pt has never had this done before at our clinic.  Pt informed that due to his h/o being discharged from [facility] as well as a medicare pt I suspect he will have sig barriers to establishing PCP care elsewhere so I would like to proceed to try to re-establish a therapeutic relationship.  Pt initially agreed but then left AMA w/o informing anyone shortly after I walked out of the room to get the pulse ox so that we could complete his supplemental O2 medicare papers.  Pt is welcome to come back for evaluation and is aware that he can schedule at 104 if he does not want to wait at 102.  However, I will not refill any of his medications and do not have the info to complete his paperwork w/o OV.   I personally performed the services described in this documentation, which was scribed in my presence. The recorded information has been reviewed and  considered, and addended by me as needed.  Delman Cheadle, MD MPH

## 2014-12-02 ENCOUNTER — Other Ambulatory Visit: Payer: Self-pay | Admitting: Family Medicine

## 2014-12-09 ENCOUNTER — Ambulatory Visit: Payer: Commercial Managed Care - HMO | Admitting: Family Medicine

## 2014-12-10 ENCOUNTER — Other Ambulatory Visit: Payer: Self-pay | Admitting: Physician Assistant

## 2014-12-12 ENCOUNTER — Encounter: Payer: Self-pay | Admitting: Family Medicine

## 2014-12-12 ENCOUNTER — Ambulatory Visit (INDEPENDENT_AMBULATORY_CARE_PROVIDER_SITE_OTHER): Payer: Commercial Managed Care - HMO | Admitting: Family Medicine

## 2014-12-12 VITALS — BP 160/90 | HR 98 | Temp 97.6°F | Resp 20 | Ht 73.0 in | Wt 256.0 lb

## 2014-12-12 DIAGNOSIS — H6123 Impacted cerumen, bilateral: Secondary | ICD-10-CM

## 2014-12-12 DIAGNOSIS — E119 Type 2 diabetes mellitus without complications: Secondary | ICD-10-CM

## 2014-12-12 DIAGNOSIS — Z79899 Other long term (current) drug therapy: Secondary | ICD-10-CM

## 2014-12-12 DIAGNOSIS — I10 Essential (primary) hypertension: Secondary | ICD-10-CM

## 2014-12-12 DIAGNOSIS — D500 Iron deficiency anemia secondary to blood loss (chronic): Secondary | ICD-10-CM

## 2014-12-12 DIAGNOSIS — F411 Generalized anxiety disorder: Secondary | ICD-10-CM

## 2014-12-12 LAB — BASIC METABOLIC PANEL
BUN: 10 mg/dL (ref 6–23)
CO2: 31 meq/L (ref 19–32)
Calcium: 9 mg/dL (ref 8.4–10.5)
Chloride: 101 mEq/L (ref 96–112)
Creat: 0.98 mg/dL (ref 0.50–1.35)
Glucose, Bld: 177 mg/dL — ABNORMAL HIGH (ref 70–99)
Potassium: 4.5 mEq/L (ref 3.5–5.3)
SODIUM: 140 meq/L (ref 135–145)

## 2014-12-12 LAB — POCT GLYCOSYLATED HEMOGLOBIN (HGB A1C): HEMOGLOBIN A1C: 6.4

## 2014-12-12 LAB — GLUCOSE, POCT (MANUAL RESULT ENTRY): POC Glucose: 221 mg/dl — AB (ref 70–99)

## 2014-12-12 MED ORDER — ALPRAZOLAM 0.5 MG PO TABS: 0.5000 mg | ORAL_TABLET | Freq: Three times a day (TID) | ORAL | Status: DC | PRN

## 2014-12-12 MED ORDER — HYDROCODONE-ACETAMINOPHEN 5-325 MG PO TABS: 1.0000 | ORAL_TABLET | Freq: Two times a day (BID) | ORAL | Status: DC | PRN

## 2014-12-12 NOTE — Patient Instructions (Signed)
Your have good blood sugar number today. The medication that you take to control your blood sugar needs to be cut back to ONE TABLET DAILY with your main meal.   Dr. Brigitte Pulse states you can come in at the [facility] when you need to be seen; you will be seen by the physician that is available. Dr. Brigitte Pulse will no longer see you by appointment at [facility].
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
## 2014-12-13 LAB — CBC
HCT: 33.8 % — ABNORMAL LOW (ref 39.0–52.0)
HEMOGLOBIN: 10.9 g/dL — AB (ref 13.0–17.0)
MCH: 29.9 pg (ref 26.0–34.0)
MCHC: 32.2 g/dL (ref 30.0–36.0)
MCV: 92.6 fL (ref 78.0–100.0)
MPV: 11.2 fL (ref 8.6–12.4)
Platelets: 292 10*3/uL (ref 150–400)
RBC: 3.65 MIL/uL — ABNORMAL LOW (ref 4.22–5.81)
RDW: 15.4 % (ref 11.5–15.5)
WBC: 8.6 10*3/uL (ref 4.0–10.5)

## 2014-12-14 NOTE — Progress Notes (Signed)
Quick Note:  Please advise pt regarding following labs...  Your blood tests show that you are still anemic. Continue taking current medications, especially the iron and folate supplements.  Your kidney function is good; your blood sugar was above normal on the blood sample; remember to take your Diabetes medication (metformin) once a day with your largest meal. I do not want your blood sugar to drop to low.  Try to eat as healthy as you can and stay well hydrated.  Copy to pt. ______

## 2014-12-15 ENCOUNTER — Telehealth: Payer: Self-pay | Admitting: Pulmonary Disease

## 2014-12-15 ENCOUNTER — Ambulatory Visit: Payer: Commercial Managed Care - HMO | Admitting: Surgery

## 2014-12-15 DIAGNOSIS — J439 Emphysema, unspecified: Secondary | ICD-10-CM

## 2014-12-15 NOTE — Telephone Encounter (Signed)
Spoke with pt, states he was hospitalized recently, [facility] did a qualifying walk and a ono to qualify for 02 but needs some kind of rx from [place]? I advised pt that I would call [facility] to try and figure out what is needed.  Spoke with Freda Munro at [facility], verified that he just needs an order for his daytime and nocturnal 02.  Order placed for his 02.  Nothing further needed.

## 2014-12-15 NOTE — Telephone Encounter (Signed)
Received call from [place], [facility]. Bunny requested within the order for new O2 to include home system and O2 delivered via nasal cannula. Order printed and requested info written on order Janean Sark stated it wills till be accepted if written). Order faxed directly to [facility] at [phone]. Phone number: [phone]. Nothing further needed.

## 2014-12-16 ENCOUNTER — Encounter: Payer: Self-pay | Admitting: Family Medicine

## 2014-12-17 ENCOUNTER — Telehealth: Payer: Self-pay | Admitting: Pulmonary Disease

## 2014-12-17 NOTE — Telephone Encounter (Signed)
Spoke with Bunny at [facility], states that there are several missing components on order- needs: Ammendment to oxygen order 12/17/14- 2.5lpm flow continuous via nasal cannula home fill and concentrator, BQ sign and date.  Leave all other info at top.    Order faxed directly to [facility] at [phone].  Nothing further needed at this time.

## 2014-12-21 IMAGING — CT CT CTA ABD/PEL W/CM AND/OR W/O CM
3 of 10 series · 10 of 36 positions shown, 15 images · IV contrast (80CC OMNI 350)
Comparison: 12/04/2013

CLINICAL DATA: [DATE] aortic aneurysm post stent graft.

EXAM:
CT ANGIOGRAPHY ABDOMEN AND PELVIS
TECHNIQUE: Multidetector CT imaging of the abdomen and pelvis was performed
using the standard protocol during bolus administration of
intravenous contrast. Multiplanar reconstructed images including
MIPs were obtained and reviewed to evaluate the vascular anatomy.
CONTRAST:  80mL OMNIPAQUE IOHEXOL 350 MG/ML SOLN

[Series 5: angio 2.5 · axial · 0.98mm/px · z∈[-410,-118]mm · 4 of 195 slices shown, 9 images]
[im 39/195  soft-tissue]
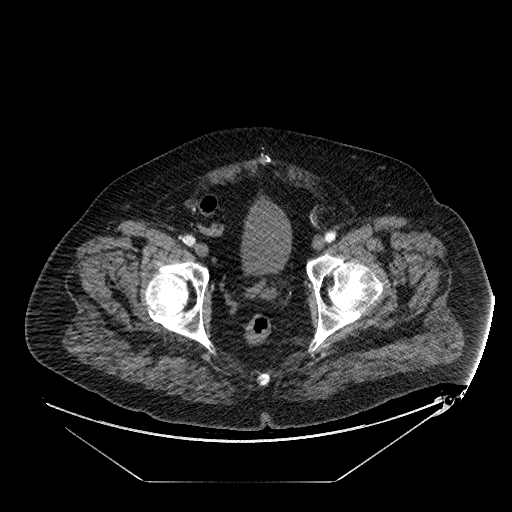
[im 39/195  lung]
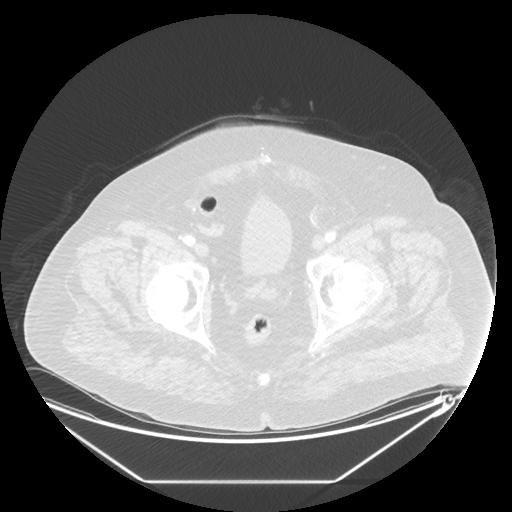
[im 39/195  bone]
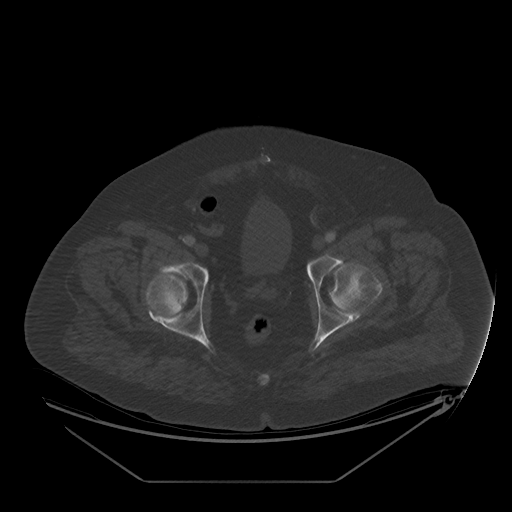
[im 78/195  soft-tissue]
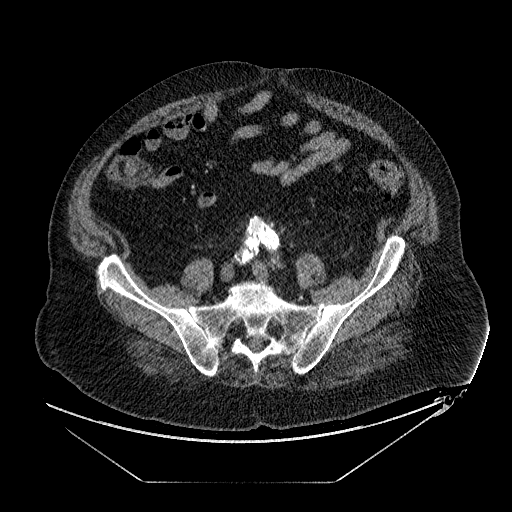
[im 78/195  lung]
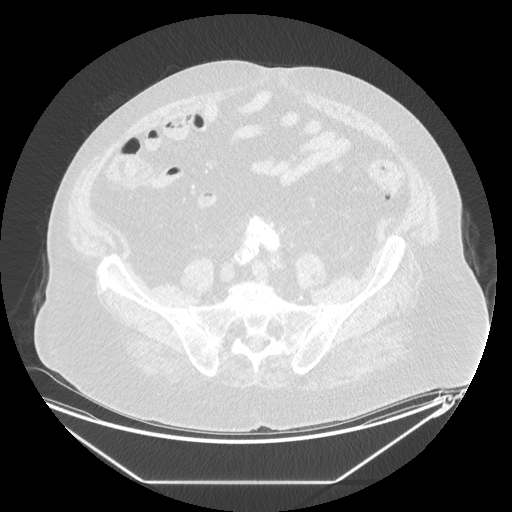
[im 117/195  soft-tissue]
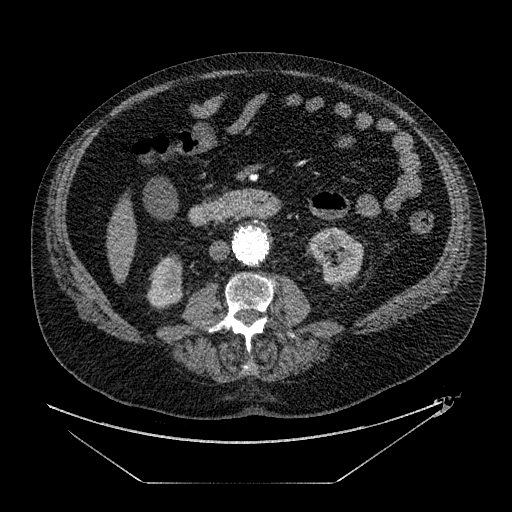
[im 117/195  lung]
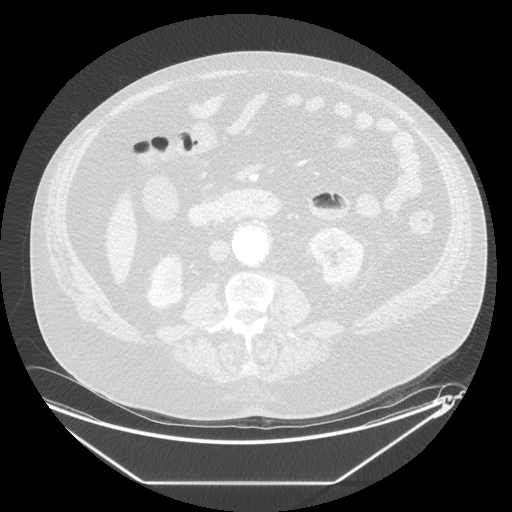
[im 156/195  soft-tissue]
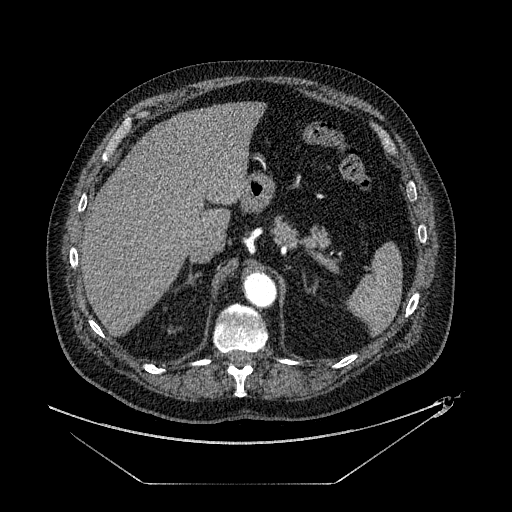
[im 156/195  lung]
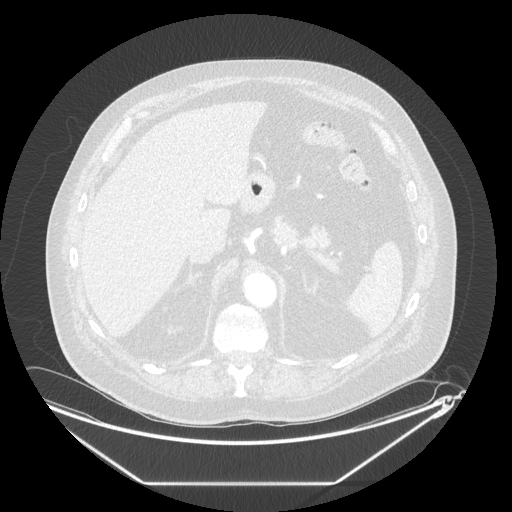

[Series 602: sagittal body · sagittal · 0.98mm/px · 3 of 187 slices shown (1 of 2)]
[im 47/187  soft-tissue]
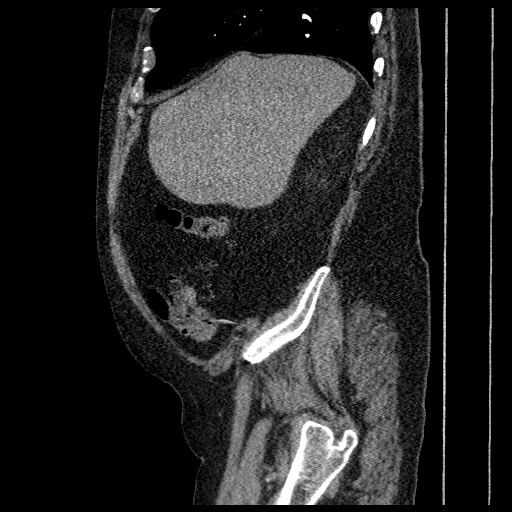
[im 94/187  soft-tissue]
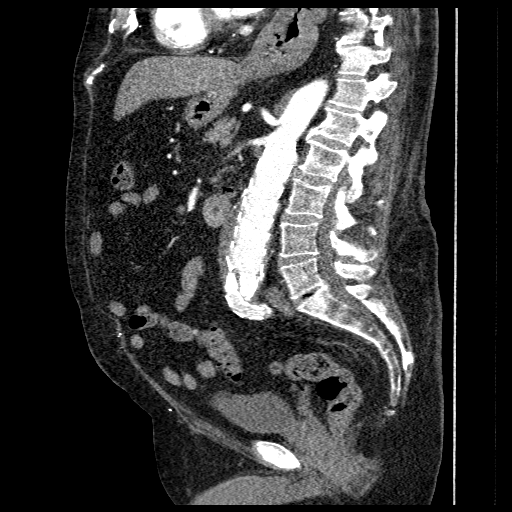
[im 140/187  soft-tissue]
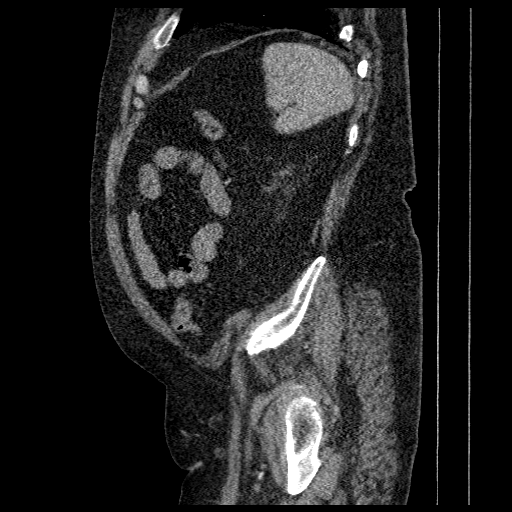

[Series 607: sagittal body · sagittal · 0.98mm/px · 3 of 185 slices shown (2 of 2)]
[im 47/185  soft-tissue]
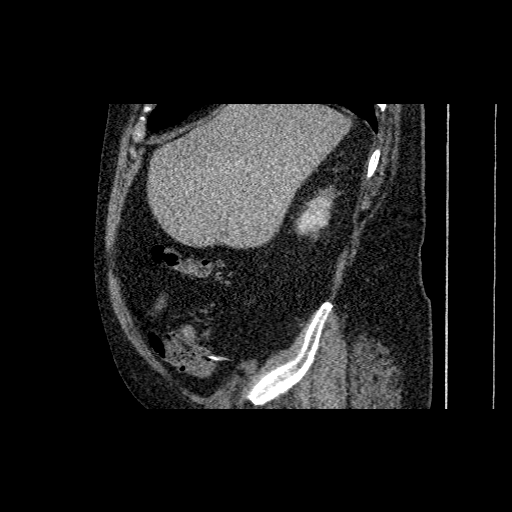
[im 93/185  soft-tissue]
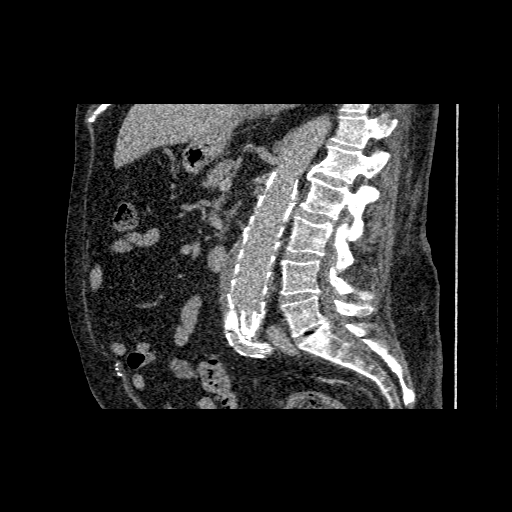
[im 139/185  soft-tissue]
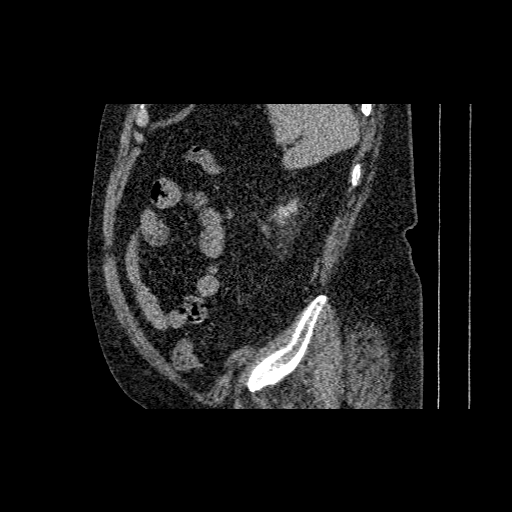

[10 of 36 positions shown; findings below may reference images not displayed]

FINDINGS: ARTERIAL FINDINGS:

Aorta: Juxtarenal bifurcated aortic stent graft remains patent,
without endoleak. Native aneurysm sac diameter 5.5 cm, stable by my
measurement.

Celiac axis:          Patent

Superior mesenteric:  Patent, stent tines across ostium.

Left renal:           Single, patent, stent tines across ostium

Right renal:          Single, patent, stent tines across ostium

Inferior mesenteric: Short segment origin occlusion, reconstituted
distally by visceral collaterals.

Left iliac: Left limb of the stent graft extends to the mid common
iliac. The native internal and external iliac arteries show no
aneurysm or stenosis.

Right iliac: Previous coil embolization of the internal iliac
artery. Right limb of the stent graft extends to the mid external
iliac artery. Distal native vessel normal in appearance.

Venous findings: Patent hepatic veins, portal vein, superior
mesenteric vein, splenic vein, bilateral renal veins, IVC.

Review of the MIP images confirms the above findings.

Nonvascular findings: Linear scarring or subsegmental atelectasis in
the visualized lung bases. Previous median sternotomy. Large hiatal
hernia including the gastric fundus and a large amount of fat.
Unremarkable liver, nondilated gallbladder, spleen, adrenal glands,
kidneys. Colon and small bowel are nondilated. No ascites. Urinary
bladder is nondistended. Right inguinal hernia with involvement by
as loop of small bowel, without obstruction or strangulation. No
ascites. No free air. Bilateral pelvic phleboliths. Degenerative
disc disease L5-S1.
IMPRESSION: 1. Patent juxtarenal bifurcated aortic stent graft with no endoleak,
as stable native sac diameter.
2. Large hiatal hernia.
3. Right inguinal hernia involving a loop of small bowel without
obstruction or strangulation.

## 2014-12-26 ENCOUNTER — Encounter: Payer: Self-pay | Admitting: Surgery

## 2014-12-28 ENCOUNTER — Other Ambulatory Visit: Payer: Self-pay | Admitting: Physician Assistant

## 2014-12-29 ENCOUNTER — Ambulatory Visit (INDEPENDENT_AMBULATORY_CARE_PROVIDER_SITE_OTHER): Payer: Commercial Managed Care - HMO | Admitting: Surgery

## 2014-12-29 ENCOUNTER — Other Ambulatory Visit: Payer: Self-pay | Admitting: *Deleted

## 2014-12-29 ENCOUNTER — Encounter: Payer: Self-pay | Admitting: Surgery

## 2014-12-29 ENCOUNTER — Encounter (HOSPITAL_COMMUNITY): Payer: Self-pay

## 2014-12-29 ENCOUNTER — Ambulatory Visit (HOSPITAL_COMMUNITY)
Admission: RE | Admit: 2014-12-29 | Discharge: 2014-12-29 | Disposition: A | Discharge status: Home / Self Care | Payer: Commercial Managed Care - HMO | Source: Ambulatory Visit | Attending: Surgery | Admitting: Surgery

## 2014-12-29 VITALS — BP 117/77 | HR 102 | Ht 73.0 in | Wt 253.0 lb

## 2014-12-29 DIAGNOSIS — K449 Diaphragmatic hernia without obstruction or gangrene: Secondary | ICD-10-CM | POA: Insufficient documentation

## 2014-12-29 DIAGNOSIS — Z48812 Encounter for surgical aftercare following surgery on the circulatory system: Secondary | ICD-10-CM | POA: Diagnosis not present

## 2014-12-29 DIAGNOSIS — I714 Abdominal aortic aneurysm, without rupture, unspecified: Secondary | ICD-10-CM

## 2014-12-29 DIAGNOSIS — K4090 Unilateral inguinal hernia, without obstruction or gangrene, not specified as recurrent: Secondary | ICD-10-CM | POA: Insufficient documentation

## 2014-12-29 DIAGNOSIS — M47896 Other spondylosis, lumbar region: Secondary | ICD-10-CM | POA: Insufficient documentation

## 2014-12-29 MED ORDER — IOHEXOL 350 MG/ML SOLN
100.0000 mL | Freq: Once | INTRAVENOUS | Status: AC | PRN
  Administered 2014-12-29: 100 mL via INTRAVENOUS
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
## 2015-01-07 ENCOUNTER — Other Ambulatory Visit: Payer: Self-pay

## 2015-01-07 ENCOUNTER — Telehealth: Payer: Self-pay | Admitting: *Deleted

## 2015-01-07 NOTE — Telephone Encounter (Signed)
Spoke with patient he is looking for a refill of his Lorrin Mais was told a message was sent to doctor Brigitte Pulse.

## 2015-01-07 NOTE — Telephone Encounter (Signed)
Patient is calling to request a refill for Ambien. He is out of medication and thought maybe he could get it refilled sooner if he calls [place] instead of the pharmacy!!!  [facility] [address]

## 2015-01-08 ENCOUNTER — Telehealth: Payer: Self-pay

## 2015-01-08 MED ORDER — ZOLPIDEM TARTRATE 10 MG PO TABS: ORAL_TABLET | ORAL | Status: DC

## 2015-01-08 NOTE — Telephone Encounter (Signed)
Pt called back again and was very upset and "grouchy" on the phone with Larene Beach, and asked if anyone else can fill his Ambien before tomorrow because he is not sleeping and doesn't want to have to wait until tomorrow for Dr Brigitte Pulse to review.

## 2015-01-09 NOTE — Telephone Encounter (Signed)
Looks like [facility] sent pt in a 30d supply of zolpidem yesterday.  I would recommend to pt that within the next month he come in for an appointment so he can get all of his meds refilled at once - esp w/ the controlled meds.  Pt plans to come to [facility] for his primary care but he does not have a single physician that he has been able to develop a therapeutic relationship with - if there is someone he wants to see - like Dr. Elder Cyphers - he can call ahead and come in while they are working or he can just try his luck at a time that works before for him.  Expect at 2-3 hr wait - advise to stay away from Mon/Friday - especially those evenings are busy and Sat tends to be busy as well.

## 2015-01-09 NOTE — Telephone Encounter (Signed)
Faxed and notified pt on VM. 

## 2015-01-09 NOTE — Telephone Encounter (Signed)
ERROR

## 2015-01-10 NOTE — Telephone Encounter (Signed)
Pt notified. Will f/u in one month.

## 2015-01-16 ENCOUNTER — Ambulatory Visit: Payer: Commercial Managed Care - HMO | Admitting: Family Medicine

## 2015-01-19 ENCOUNTER — Other Ambulatory Visit: Payer: Self-pay | Admitting: Family Medicine

## 2015-01-23 ENCOUNTER — Other Ambulatory Visit: Payer: Self-pay | Admitting: Family Medicine

## 2015-02-11 ENCOUNTER — Other Ambulatory Visit: Payer: Self-pay | Admitting: Cardiovascular Disease

## 2015-02-11 NOTE — Telephone Encounter (Signed)
Rx(s) sent to pharmacy electronically.  

## 2015-02-13 ENCOUNTER — Ambulatory Visit (INDEPENDENT_AMBULATORY_CARE_PROVIDER_SITE_OTHER): Payer: Commercial Managed Care - HMO | Admitting: Emergency Medicine

## 2015-02-13 VITALS — BP 128/68 | HR 69 | Temp 97.7°F | Resp 18 | Ht 73.0 in | Wt 258.0 lb

## 2015-02-13 DIAGNOSIS — H6123 Impacted cerumen, bilateral: Secondary | ICD-10-CM | POA: Diagnosis not present

## 2015-02-13 DIAGNOSIS — R5382 Chronic fatigue, unspecified: Secondary | ICD-10-CM | POA: Diagnosis not present

## 2015-02-13 DIAGNOSIS — K922 Gastrointestinal hemorrhage, unspecified: Secondary | ICD-10-CM

## 2015-02-13 DIAGNOSIS — N390 Urinary tract infection, site not specified: Secondary | ICD-10-CM

## 2015-02-13 DIAGNOSIS — R300 Dysuria: Secondary | ICD-10-CM

## 2015-02-13 DIAGNOSIS — J439 Emphysema, unspecified: Secondary | ICD-10-CM

## 2015-02-13 DIAGNOSIS — E118 Type 2 diabetes mellitus with unspecified complications: Secondary | ICD-10-CM

## 2015-02-13 DIAGNOSIS — I10 Essential (primary) hypertension: Secondary | ICD-10-CM

## 2015-02-13 DIAGNOSIS — D500 Iron deficiency anemia secondary to blood loss (chronic): Secondary | ICD-10-CM

## 2015-02-13 DIAGNOSIS — R8281 Pyuria: Secondary | ICD-10-CM

## 2015-02-13 LAB — POCT CBC
Granulocyte percent: 84.1 %G — AB (ref 37–80)
HEMATOCRIT: 39.2 % — AB (ref 43.5–53.7)
Hemoglobin: 12.4 g/dL — AB (ref 14.1–18.1)
Lymph, poc: 1.5 (ref 0.6–3.4)
MCH: 28.3 pg (ref 27–31.2)
MCHC: 31.7 g/dL — AB (ref 31.8–35.4)
MCV: 89.4 fL (ref 80–97)
MID (cbc): 0.3 (ref 0–0.9)
MPV: 9 fL (ref 0–99.8)
POC Granulocyte: 9.6 — AB (ref 2–6.9)
POC LYMPH PERCENT: 13 %L (ref 10–50)
POC MID %: 2.9 %M (ref 0–12)
Platelet Count, POC: 234 10*3/uL (ref 142–424)
RBC: 4.38 M/uL — AB (ref 4.69–6.13)
RDW, POC: 15.2 %
WBC: 11.4 10*3/uL — AB (ref 4.6–10.2)

## 2015-02-13 LAB — COMPLETE METABOLIC PANEL WITH GFR
ALK PHOS: 138 U/L — AB (ref 39–117)
ALT: 10 U/L (ref 0–53)
AST: 13 U/L (ref 0–37)
Albumin: 4.2 g/dL (ref 3.5–5.2)
BUN: 18 mg/dL (ref 6–23)
CO2: 30 meq/L (ref 19–32)
Calcium: 9.5 mg/dL (ref 8.4–10.5)
Chloride: 97 mEq/L (ref 96–112)
Creat: 1.09 mg/dL (ref 0.50–1.35)
GFR, Est African American: 77 mL/min
GFR, Est Non African American: 67 mL/min
GLUCOSE: 215 mg/dL — AB (ref 70–99)
Potassium: 4.4 mEq/L (ref 3.5–5.3)
Sodium: 139 mEq/L (ref 135–145)
Total Bilirubin: 0.4 mg/dL (ref 0.2–1.2)
Total Protein: 7.1 g/dL (ref 6.0–8.3)

## 2015-02-13 LAB — POCT UA - MICROSCOPIC ONLY
Bacteria, U Microscopic: NEGATIVE
CASTS, UR, LPF, POC: NEGATIVE
Crystals, Ur, HPF, POC: NEGATIVE
Epithelial cells, urine per micros: NEGATIVE
Renal tubular cells: POSITIVE
Yeast, UA: NEGATIVE

## 2015-02-13 LAB — POCT URINALYSIS DIPSTICK
Blood, UA: NEGATIVE
Glucose, UA: NEGATIVE
NITRITE UA: NEGATIVE
Protein, UA: 30
Spec Grav, UA: >=1.03
UROBILINOGEN UA: 0.2
pH, UA: 5.5

## 2015-02-13 LAB — GLUCOSE, POCT (MANUAL RESULT ENTRY): POC Glucose: 243 mg/dl — AB (ref 70–99)

## 2015-02-13 MED ORDER — ZOLPIDEM TARTRATE 5 MG PO TABS: ORAL_TABLET | ORAL | Status: DC

## 2015-02-13 MED ORDER — ALPRAZOLAM 0.5 MG PO TABS: 0.5000 mg | ORAL_TABLET | Freq: Three times a day (TID) | ORAL | Status: DC | PRN

## 2015-02-13 NOTE — Progress Notes (Signed)
Subjective:    Patient ID: [name], male    DOB: [birth year], 74 y.o.   MRN: [id number]  HPI patient presents for refill of medications. He has multiple complaints at this time. #1 he has wax impaction of his right ear. #2 he has increased fatigue. He has COPD and is on chronic oxygen therapy. He has a history of recurrent GI bleeds. He has been to multiple GI specialists including Dr. Almyra Free and Dr. Paulita Fujita and also with GI at [facility]. He says he no longer sees any of these doctors. He has been to see Dr. Brigitte Pulse and Dr. Leward Quan and states he was not comfortable with those visits. His main issue today is fatigue and he is worried he is bleeding again. He denies any recurrent black stools.    Review of Systems     Objective:   Physical Exam  Constitutional:  Patient seated in the chair on continuous O2.  HENT:  The right TM is occluded with wax.  Neck: Neck supple.  Cardiovascular:  There is a regular rate and rhythm without murmurs or gallops.  Pulmonary/Chest:  Patient is on O2. There are decreased breath sounds in both bases.  Abdominal: Soft. There is no tenderness.  Genitourinary:  There is no discharge noted he was able to urinate a small amount of concentrated urine   Results for orders placed or performed in visit on 02/13/15  POCT glucose (manual entry)  Result Value Ref Range   POC Glucose 243 (A) 70 - 99 mg/dl  POCT UA - Microscopic Only  Result Value Ref Range   WBC, Ur, HPF, POC tntc    RBC, urine, microscopic 0-1    Bacteria, U Microscopic neg    Mucus, UA small    Epithelial cells, urine per micros neg    Crystals, Ur, HPF, POC neg    Casts, Ur, LPF, POC neg    Yeast, UA neg    Renal tubular cells positive   POCT urinalysis dipstick  Result Value Ref Range   Color, UA yellow    Clarity, UA hazy    Glucose, UA neg    Bilirubin, UA small    Ketones, UA trace    Spec Grav, UA >=1.030    Blood, UA neg    pH, UA 5.5    Protein, UA 30    Urobilinogen, UA 0.2    Nitrite, UA neg    Leukocytes, UA moderate (2+)   POCT CBC  Result Value Ref Range   WBC 11.4 (A) 4.6 - 10.2 K/uL   Lymph, poc 1.5 0.6 - 3.4   POC LYMPH PERCENT 13.0 10 - 50 %L   MID (cbc) 0.3 0 - 0.9   POC MID % 2.9 0 - 12 %M   POC Granulocyte 9.6 (A) 2 - 6.9   Granulocyte percent 84.1 (A) 37 - 80 %G   RBC 4.38 (A) 4.69 - 6.13 M/uL   Hemoglobin 12.4 (A) 14.1 - 18.1 g/dL   HCT, POC 39.2 (A) 43.5 - 53.7 %   MCV 89.4 80 - 97 fL   MCH, POC 28.3 27 - 31.2 pg   MCHC 31.7 (A) 31.8 - 35.4 g/dL   RDW, POC 15.2 %   Platelet Count, POC 234 142 - 424 K/uL   MPV 9.0 0 - 99.8 fL         Assessment & Plan:  I refilled his Xanax and Ambien. I gave him the information about your message prescribing  protocol. His urine showed significant pyuria. Culture will be done.I personally performed the services described in this documentation, which was scribed in my presence. The recorded information has been reviewed and is accurate.

## 2015-02-13 NOTE — Patient Instructions (Signed)
[facility] Policy for Prescribing Controlled Substances (Revised 08/2012) 1. Prescriptions for controlled substances will be filled by ONE provider at [facility] with whom you have established and developed a plan for your care, including follow-up. 2. You are encouraged to schedule an appointment with your prescriber at our appointment center for follow-up visits whenever possible. 3. If you request a prescription for the controlled substance while at [facility] for an acute problem (with someone other than your regular prescriber), you MAY be given a ONE-TIME prescription for a 30-day supply of the controlled substance, to allow time for you to return to see your regular prescriber for additional prescriptions. 

## 2015-02-15 LAB — URINE CULTURE: Colony Count: 80000

## 2015-02-16 ENCOUNTER — Telehealth: Payer: Self-pay | Admitting: Radiology

## 2015-02-16 NOTE — Telephone Encounter (Signed)
Spoke with pt. He is feeling much better. He is not having any abd pain, fever or chills. Informed pt that if he starts to experience any of these things he is to let us know right away or go to ER for IV abx. Pt understood.

## 2015-02-17 ENCOUNTER — Other Ambulatory Visit: Payer: Self-pay | Admitting: Physician Assistant

## 2015-02-18 NOTE — Telephone Encounter (Signed)
PATIENT CAME IN FOR A RTC VISIT ON 4-29 FOR MEDICATION REFILL, CELEXA WASN'T REFILLED.  ADVISE

## 2015-02-18 NOTE — Telephone Encounter (Signed)
Meds ordered this encounter  Medications  . citalopram (CELEXA) 40 MG tablet    Sig: TAKE 1 TABLET (40 MG TOTAL) BY MOUTH DAILY.    Dispense:  30 tablet    Refill:  5    OK to dispense #60, RF x 1 if patient prefers.

## 2015-02-24 ENCOUNTER — Other Ambulatory Visit: Payer: Self-pay | Admitting: Physician Assistant

## 2015-02-27 ENCOUNTER — Telehealth: Payer: Self-pay | Admitting: Pulmonary Disease

## 2015-02-27 ENCOUNTER — Telehealth: Payer: Self-pay

## 2015-02-27 MED ORDER — DOXYCYCLINE HYCLATE 100 MG PO TABS: 100.0000 mg | ORAL_TABLET | Freq: Two times a day (BID) | ORAL | Status: DC

## 2015-02-27 NOTE — Telephone Encounter (Signed)
I called wife, advised she would have to come in to the office. She is adamant that he is too weak to come in. He coughed all night and no OTC meds helped. Please advise.

## 2015-02-27 NOTE — Telephone Encounter (Signed)
I am so sorry that the patient is not feeling well but it is really much better medicine to be able to evaluate the patient and make sure of the correct abx to treat his problem.  He might be able to get an appt on Monday but I am worried that if he waits that long he will be even sicker.  Please again recommend an OV.

## 2015-02-27 NOTE — Telephone Encounter (Signed)
Rx has been sent in per BQ. Pt is aware. Nothing further was needed.

## 2015-02-27 NOTE — Telephone Encounter (Signed)
OK, doxycycline 100mg  po bid x5 days.  Take with a probiotic Instruct him to go to the ER or urgent care if his symptoms get worse

## 2015-02-27 NOTE — Telephone Encounter (Signed)
Spoke with pt. Thinks he could possibly have the flu. Reports increased SOB, chest tightness, wheezing and coughing. Might be running a fever but he is unsure. Onset of symptoms was 5-6 days ago. Would like an antibiotic called in.  BQ- please advise. Thanks.

## 2015-02-27 NOTE — Telephone Encounter (Signed)
PATIENT'S WIFE ([place]) CALLED TO SAY SHE AND HER HUSBAND HAS BEEN VERY SICK. HE HAS BEEN COUGHING AND IS VERY WEAK BECAUSE HE HAS COPD. SHE SAID HE CAN NOT COME INTO THE OFFICE AND SIT FOR A LONG TIME. SHE WOULD LIKE TO HAVE HIM A ANTIBIOTIC CALLED INTO THE PHARMACY. SHE SAID SHE IS TAKING DOXYCYCLINE. SHE WOULD LIKE HIM TO HAVE THE SAME THING. BEST PHONE [phone] (HOME) ([place])  [place], [place]

## 2015-02-28 NOTE — Telephone Encounter (Signed)
Wife notified of message

## 2015-03-05 ENCOUNTER — Telehealth: Payer: Self-pay | Admitting: Pulmonary Disease

## 2015-03-05 ENCOUNTER — Telehealth: Payer: Self-pay

## 2015-03-05 MED ORDER — DOXYCYCLINE HYCLATE 100 MG PO TABS: 100.0000 mg | ORAL_TABLET | Freq: Two times a day (BID) | ORAL | Status: DC

## 2015-03-05 NOTE — Telephone Encounter (Signed)
Doxycycline made patient about 1/2 better, but he is still coughing up clear/white mucus, chest congestion, chest tightness, head is stopped up.  Wheezing a lot, cannot get enough breath when he inhales,  Wearing 3L of oxygen.  Patient says that when he exhales, he coughs. Thinks he needs more antibiotics.    Patient says that he also needs prescription sent to [facility] to cover oxygen.    Dr. Lake Bells, please advise.

## 2015-03-05 NOTE — Telephone Encounter (Signed)
No I will not see this patient nor refill any of his controlled meds - he has been personally rude to me.  I will ask Dr. Everlene Farrier if he will be willing to see him.  I do not mind if he is seen as [facility] if he can find someone else to take him on.  If he needs his medication in the meantime he can come in to be seen - either Dr. Ouida Sills or Dr. Elder Cyphers may be a better fit.  I am ok for him to be seen here as a pt but he has been rude to some of our male cmas as well - in which case I encourage everyone to document everything and make Tim aware if they are spoken to in any diregotory manner or language.

## 2015-03-05 NOTE — Telephone Encounter (Signed)
Per Dr. Lake Bells, need to send 5 more days of Doxycycline 100mg  BID.  If not any better, call for appointment. Patient notified.  Rx sent to pharmacy.  ---------------------------------------------- Sent message to [facility] at [facility] regarding oxygen order to see what they needed from [place] to get patient's oxygen?  Awaiting message back from [place].   To Caryl Pina for follow up.

## 2015-03-05 NOTE — Telephone Encounter (Signed)
Pt needs refill on HYDROcodone-acetaminophen (NORCO/VICODIN) 5-325 MG per tablet [[id number]].

## 2015-03-06 ENCOUNTER — Telehealth: Payer: Self-pay | Admitting: Emergency Medicine

## 2015-03-06 ENCOUNTER — Ambulatory Visit (INDEPENDENT_AMBULATORY_CARE_PROVIDER_SITE_OTHER): Payer: Commercial Managed Care - HMO

## 2015-03-06 NOTE — Telephone Encounter (Signed)
Spoke with wife, pt is in the waiting room being seen at our clinic now.

## 2015-03-06 NOTE — Telephone Encounter (Signed)
I went to speak with the patient at check in. I told him I would not be able to write for any hydrocodone. Patient was very upset at what I had to say. I told him we were under restrictions from the [facility] regarding writing for narcotics. I told him I would not be able to write for narcotics today because I had not written him for narcotics in the past. He told me he did not believe the board had those kind of restrictions. He did go to the room. After proper 10 minutes he walked out of the room and stated he was leaving. I was not comfortable writing for narcotics for this patient as I do not have a history with him. When I reviewed the notes there been previous issues with Dr. Leward Quan and with Dr. Brigitte Pulse that are well documented.

## 2015-03-06 NOTE — Telephone Encounter (Signed)
I am not able to write for controlled substances. These were not prescribed by me and I will not be able to fill them.

## 2015-03-09 ENCOUNTER — Observation Stay
Admission: EM | Admit: 2015-03-09 | Discharge: 2015-03-12 | Disposition: A | Discharge status: Home / Self Care | Payer: Commercial Managed Care - HMO | Attending: Internal Medicine | Admitting: Internal Medicine

## 2015-03-09 ENCOUNTER — Encounter: Payer: Self-pay | Admitting: Emergency Medicine

## 2015-03-09 DIAGNOSIS — I2510 Atherosclerotic heart disease of native coronary artery without angina pectoris: Secondary | ICD-10-CM | POA: Insufficient documentation

## 2015-03-09 DIAGNOSIS — Z7951 Long term (current) use of inhaled steroids: Secondary | ICD-10-CM | POA: Insufficient documentation

## 2015-03-09 DIAGNOSIS — Z79899 Other long term (current) drug therapy: Secondary | ICD-10-CM | POA: Diagnosis not present

## 2015-03-09 DIAGNOSIS — H6123 Impacted cerumen, bilateral: Secondary | ICD-10-CM | POA: Diagnosis not present

## 2015-03-09 DIAGNOSIS — I10 Essential (primary) hypertension: Secondary | ICD-10-CM | POA: Insufficient documentation

## 2015-03-09 DIAGNOSIS — J9611 Chronic respiratory failure with hypoxia: Secondary | ICD-10-CM | POA: Diagnosis not present

## 2015-03-09 DIAGNOSIS — I252 Old myocardial infarction: Secondary | ICD-10-CM | POA: Insufficient documentation

## 2015-03-09 DIAGNOSIS — F329 Major depressive disorder, single episode, unspecified: Secondary | ICD-10-CM | POA: Insufficient documentation

## 2015-03-09 DIAGNOSIS — I509 Heart failure, unspecified: Secondary | ICD-10-CM | POA: Insufficient documentation

## 2015-03-09 DIAGNOSIS — Z836 Family history of other diseases of the respiratory system: Secondary | ICD-10-CM | POA: Insufficient documentation

## 2015-03-09 DIAGNOSIS — Z880 Allergy status to penicillin: Secondary | ICD-10-CM | POA: Insufficient documentation

## 2015-03-09 DIAGNOSIS — I714 Abdominal aortic aneurysm, without rupture: Secondary | ICD-10-CM | POA: Insufficient documentation

## 2015-03-09 DIAGNOSIS — Z8249 Family history of ischemic heart disease and other diseases of the circulatory system: Secondary | ICD-10-CM | POA: Insufficient documentation

## 2015-03-09 DIAGNOSIS — G4700 Insomnia, unspecified: Secondary | ICD-10-CM | POA: Diagnosis not present

## 2015-03-09 DIAGNOSIS — K219 Gastro-esophageal reflux disease without esophagitis: Secondary | ICD-10-CM | POA: Insufficient documentation

## 2015-03-09 DIAGNOSIS — Z8489 Family history of other specified conditions: Secondary | ICD-10-CM | POA: Diagnosis not present

## 2015-03-09 DIAGNOSIS — R197 Diarrhea, unspecified: Secondary | ICD-10-CM | POA: Diagnosis not present

## 2015-03-09 DIAGNOSIS — I5030 Unspecified diastolic (congestive) heart failure: Secondary | ICD-10-CM | POA: Insufficient documentation

## 2015-03-09 DIAGNOSIS — R413 Other amnesia: Secondary | ICD-10-CM | POA: Diagnosis not present

## 2015-03-09 DIAGNOSIS — Z885 Allergy status to narcotic agent status: Secondary | ICD-10-CM | POA: Diagnosis not present

## 2015-03-09 DIAGNOSIS — K922 Gastrointestinal hemorrhage, unspecified: Principal | ICD-10-CM | POA: Insufficient documentation

## 2015-03-09 DIAGNOSIS — K317 Polyp of stomach and duodenum: Secondary | ICD-10-CM | POA: Diagnosis not present

## 2015-03-09 DIAGNOSIS — G894 Chronic pain syndrome: Secondary | ICD-10-CM | POA: Diagnosis not present

## 2015-03-09 DIAGNOSIS — J449 Chronic obstructive pulmonary disease, unspecified: Secondary | ICD-10-CM | POA: Insufficient documentation

## 2015-03-09 DIAGNOSIS — Z794 Long term (current) use of insulin: Secondary | ICD-10-CM | POA: Insufficient documentation

## 2015-03-09 DIAGNOSIS — D500 Iron deficiency anemia secondary to blood loss (chronic): Secondary | ICD-10-CM | POA: Diagnosis not present

## 2015-03-09 DIAGNOSIS — F411 Generalized anxiety disorder: Secondary | ICD-10-CM | POA: Insufficient documentation

## 2015-03-09 DIAGNOSIS — Z951 Presence of aortocoronary bypass graft: Secondary | ICD-10-CM | POA: Diagnosis not present

## 2015-03-09 DIAGNOSIS — E559 Vitamin D deficiency, unspecified: Secondary | ICD-10-CM | POA: Insufficient documentation

## 2015-03-09 DIAGNOSIS — D72829 Elevated white blood cell count, unspecified: Secondary | ICD-10-CM | POA: Diagnosis not present

## 2015-03-09 DIAGNOSIS — E119 Type 2 diabetes mellitus without complications: Secondary | ICD-10-CM | POA: Diagnosis not present

## 2015-03-09 DIAGNOSIS — Z87891 Personal history of nicotine dependence: Secondary | ICD-10-CM | POA: Diagnosis not present

## 2015-03-09 DIAGNOSIS — J309 Allergic rhinitis, unspecified: Secondary | ICD-10-CM | POA: Insufficient documentation

## 2015-03-09 DIAGNOSIS — Z833 Family history of diabetes mellitus: Secondary | ICD-10-CM | POA: Diagnosis not present

## 2015-03-09 DIAGNOSIS — R531 Weakness: Secondary | ICD-10-CM | POA: Diagnosis not present

## 2015-03-09 DIAGNOSIS — Z8679 Personal history of other diseases of the circulatory system: Secondary | ICD-10-CM | POA: Diagnosis not present

## 2015-03-09 DIAGNOSIS — Z9981 Dependence on supplemental oxygen: Secondary | ICD-10-CM | POA: Insufficient documentation

## 2015-03-09 DIAGNOSIS — Z888 Allergy status to other drugs, medicaments and biological substances status: Secondary | ICD-10-CM | POA: Diagnosis not present

## 2015-03-09 DIAGNOSIS — E785 Hyperlipidemia, unspecified: Secondary | ICD-10-CM | POA: Diagnosis not present

## 2015-03-09 DIAGNOSIS — R0602 Shortness of breath: Secondary | ICD-10-CM | POA: Insufficient documentation

## 2015-03-09 LAB — CBC
HCT: 32.3 % — ABNORMAL LOW (ref 40.0–52.0)
HEMOGLOBIN: 10.3 g/dL — AB (ref 13.0–18.0)
MCH: 28.6 pg (ref 26.0–34.0)
MCHC: 32 g/dL (ref 32.0–36.0)
MCV: 89.2 fL (ref 80.0–100.0)
PLATELETS: 241 10*3/uL (ref 150–440)
RBC: 3.62 MIL/uL — AB (ref 4.40–5.90)
RDW: 15.2 % — AB (ref 11.5–14.5)
WBC: 10.7 10*3/uL — ABNORMAL HIGH (ref 3.8–10.6)

## 2015-03-09 LAB — TROPONIN I: Troponin I: 0.04 ng/mL — ABNORMAL HIGH (ref <0.031)

## 2015-03-09 LAB — PROTIME-INR
INR: 1.03
PROTHROMBIN TIME: 13.7 s (ref 11.4–15.0)

## 2015-03-09 LAB — COMPREHENSIVE METABOLIC PANEL
ALBUMIN: 3.3 g/dL — AB (ref 3.5–5.0)
ALT: 11 U/L — ABNORMAL LOW (ref 17–63)
AST: 17 U/L (ref 15–41)
Alkaline Phosphatase: 92 U/L (ref 38–126)
Anion gap: 8 (ref 5–15)
BILIRUBIN TOTAL: 0.3 mg/dL (ref 0.3–1.2)
BUN: 32 mg/dL — AB (ref 6–20)
CALCIUM: 8.5 mg/dL — AB (ref 8.9–10.3)
CO2: 27 mmol/L (ref 22–32)
CREATININE: 0.97 mg/dL (ref 0.61–1.24)
Chloride: 102 mmol/L (ref 101–111)
GFR calc Af Amer: >60 mL/min (ref >60)
GFR calc non Af Amer: >60 mL/min (ref >60)
GLUCOSE: 207 mg/dL — AB (ref 65–99)
Potassium: 4.5 mmol/L (ref 3.5–5.1)
SODIUM: 137 mmol/L (ref 135–145)
Total Protein: 6.9 g/dL (ref 6.5–8.1)

## 2015-03-09 LAB — TYPE AND SCREEN
ABO/RH(D): B NEG
Antibody Screen: NEGATIVE

## 2015-03-09 LAB — APTT: aPTT: 26 seconds (ref 24–36)

## 2015-03-09 LAB — HEMOGLOBIN
Hemoglobin: 11 g/dL — ABNORMAL LOW (ref 13.0–18.0)
Hemoglobin: 10.2 g/dL — ABNORMAL LOW (ref 13.0–18.0)

## 2015-03-09 LAB — ABO/RH: ABO/RH(D): B NEG

## 2015-03-09 MED ORDER — ACETAMINOPHEN 325 MG PO TABS
650.0000 mg | ORAL_TABLET | Freq: Four times a day (QID) | ORAL | Status: DC | PRN
  Administered 2015-03-09 – 2015-03-12 (×5): 650 mg via ORAL
  Filled 2015-03-09 (×5): qty 2

## 2015-03-09 MED ORDER — CITALOPRAM HYDROBROMIDE 20 MG PO TABS
40.0000 mg | ORAL_TABLET | Freq: Every day | ORAL | Status: DC
  Administered 2015-03-09 – 2015-03-12 (×4): 40 mg via ORAL
  Filled 2015-03-09 (×4): qty 2

## 2015-03-09 MED ORDER — METFORMIN HCL 500 MG PO TABS
500.0000 mg | ORAL_TABLET | Freq: Every day | ORAL | Status: DC
Start: 2015-03-09 — End: 2015-03-12
  Administered 2015-03-09 – 2015-03-12 (×4): 500 mg via ORAL
  Filled 2015-03-09 (×4): qty 1

## 2015-03-09 MED ORDER — GUAIFENESIN-DM 100-10 MG/5ML PO SYRP
5.0000 mL | ORAL_SOLUTION | ORAL | Status: DC | PRN
  Administered 2015-03-09 – 2015-03-12 (×10): 5 mL via ORAL
  Filled 2015-03-09 (×9): qty 5

## 2015-03-09 MED ORDER — ZOLPIDEM TARTRATE 5 MG PO TABS
5.0000 mg | ORAL_TABLET | Freq: Every evening | ORAL | Status: DC | PRN
  Administered 2015-03-09 – 2015-03-11 (×3): 5 mg via ORAL
  Filled 2015-03-09 (×3): qty 1

## 2015-03-09 MED ORDER — BUDESONIDE-FORMOTEROL FUMARATE 160-4.5 MCG/ACT IN AERO
2.0000 | INHALATION_SPRAY | Freq: Two times a day (BID) | RESPIRATORY_TRACT | Status: DC
  Administered 2015-03-09 – 2015-03-12 (×7): 2 via RESPIRATORY_TRACT
  Filled 2015-03-09: qty 6

## 2015-03-09 MED ORDER — ALPRAZOLAM 0.5 MG PO TABS
0.5000 mg | ORAL_TABLET | Freq: Three times a day (TID) | ORAL | Status: DC | PRN
  Administered 2015-03-09 – 2015-03-10 (×2): 0.5 mg via ORAL
  Filled 2015-03-09 (×2): qty 1

## 2015-03-09 MED ORDER — ONDANSETRON HCL 4 MG PO TABS: 4.0000 mg | ORAL_TABLET | Freq: Four times a day (QID) | ORAL | Status: DC | PRN

## 2015-03-09 MED ORDER — ATORVASTATIN CALCIUM 20 MG PO TABS
40.0000 mg | ORAL_TABLET | Freq: Every day | ORAL | Status: DC
  Administered 2015-03-09 – 2015-03-11 (×3): 40 mg via ORAL
  Filled 2015-03-09 (×3): qty 2

## 2015-03-09 MED ORDER — ALBUTEROL SULFATE HFA 108 (90 BASE) MCG/ACT IN AERS: 2.0000 | INHALATION_SPRAY | Freq: Four times a day (QID) | RESPIRATORY_TRACT | Status: DC | PRN

## 2015-03-09 MED ORDER — TIOTROPIUM BROMIDE MONOHYDRATE 18 MCG IN CAPS
18.0000 ug | ORAL_CAPSULE | Freq: Every day | RESPIRATORY_TRACT | Status: DC
  Administered 2015-03-09 – 2015-03-12 (×4): 18 ug via RESPIRATORY_TRACT
  Filled 2015-03-09: qty 5

## 2015-03-09 MED ORDER — PANTOPRAZOLE SODIUM 40 MG IV SOLR
40.0000 mg | Freq: Two times a day (BID) | INTRAVENOUS | Status: DC
  Administered 2015-03-09 – 2015-03-12 (×7): 40 mg via INTRAVENOUS
  Filled 2015-03-09 (×7): qty 40

## 2015-03-09 MED ORDER — ACETAMINOPHEN 650 MG RE SUPP: 650.0000 mg | Freq: Four times a day (QID) | RECTAL | Status: DC | PRN

## 2015-03-09 MED ORDER — METOPROLOL TARTRATE 25 MG PO TABS
25.0000 mg | ORAL_TABLET | Freq: Two times a day (BID) | ORAL | Status: DC
Start: 2015-03-09 — End: 2015-03-12
  Administered 2015-03-09 – 2015-03-12 (×7): 25 mg via ORAL
  Filled 2015-03-09 (×7): qty 1

## 2015-03-09 MED ORDER — FOLIC ACID 1 MG PO TABS
2.0000 mg | ORAL_TABLET | Freq: Every morning | ORAL | Status: DC
  Administered 2015-03-09 – 2015-03-12 (×4): 2 mg via ORAL
  Filled 2015-03-09 (×4): qty 2

## 2015-03-09 MED ORDER — ONDANSETRON HCL 4 MG/2ML IJ SOLN: 4.0000 mg | Freq: Four times a day (QID) | INTRAMUSCULAR | Status: DC | PRN

## 2015-03-09 MED ORDER — ALBUTEROL SULFATE (2.5 MG/3ML) 0.083% IN NEBU: 2.5000 mg | INHALATION_SOLUTION | Freq: Four times a day (QID) | RESPIRATORY_TRACT | Status: DC | PRN

## 2015-03-09 MED ORDER — GUAIFENESIN-DM 100-10 MG/5ML PO SYRP
ORAL_SOLUTION | ORAL | Status: AC
  Administered 2015-03-09: 5 mL via ORAL
  Filled 2015-03-09: qty 5

## 2015-03-09 MED ORDER — POLYSACCHARIDE IRON COMPLEX 150 MG PO CAPS
150.0000 mg | ORAL_CAPSULE | Freq: Two times a day (BID) | ORAL | Status: DC
  Administered 2015-03-09 – 2015-03-12 (×7): 150 mg via ORAL
  Filled 2015-03-09 (×7): qty 1

## 2015-03-09 MED ORDER — DOXYCYCLINE HYCLATE 100 MG PO TABS
100.0000 mg | ORAL_TABLET | Freq: Two times a day (BID) | ORAL | Status: DC
  Administered 2015-03-09 – 2015-03-12 (×7): 100 mg via ORAL
  Filled 2015-03-09 (×7): qty 1

## 2015-03-09 MED ORDER — SODIUM CHLORIDE 0.9 % IV SOLN
1000.0000 mL | Freq: Once | INTRAVENOUS | Status: AC
  Administered 2015-03-09: 1000 mL via INTRAVENOUS
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
## 2015-03-09 NOTE — Progress Notes (Signed)
PT Cancellation Note  Patient Details Name: [name] MRN: [id number] DOB: Jan 31, 1941   Cancelled Treatment:    Reason Eval/Treat Not Completed: Fatigue/lethargy limiting ability to participate;Medical issues which prohibited therapy;Other (comment) (Nursing reports GI bleed is ongoing).  Nursing stated pt was final word and he declined over having severe SOB with standing or sitting up per his report.  Will retry tomorrow.   [name] 03/09/2015, 2:36 PM   [name], PT MS Acute Rehab Dept. Number: [facility] [id number] and [place] [phone]

## 2015-03-09 NOTE — ED Notes (Signed)
Pt reports that he has had two episodes of dark tarry stool this am. He reports that when he gets up dizzy and that he is SOB. He does wear 3L  at home. He is pale. He is able to speak in complete sentences.

## 2015-03-10 LAB — CBC
HCT: 29 % — ABNORMAL LOW (ref 40.0–52.0)
Hemoglobin: 9.2 g/dL — ABNORMAL LOW (ref 13.0–18.0)
MCH: 28.4 pg (ref 26.0–34.0)
MCHC: 31.9 g/dL — ABNORMAL LOW (ref 32.0–36.0)
MCV: 89.1 fL (ref 80.0–100.0)
Platelets: 234 10*3/uL (ref 150–440)
RBC: 3.25 MIL/uL — ABNORMAL LOW (ref 4.40–5.90)
RDW: 15.4 % — AB (ref 11.5–14.5)
WBC: 8.8 10*3/uL (ref 3.8–10.6)

## 2015-03-10 LAB — BASIC METABOLIC PANEL
ANION GAP: 9 (ref 5–15)
BUN: 22 mg/dL — ABNORMAL HIGH (ref 6–20)
CALCIUM: 8.5 mg/dL — AB (ref 8.9–10.3)
CHLORIDE: 103 mmol/L (ref 101–111)
CO2: 28 mmol/L (ref 22–32)
Creatinine, Ser: 1.04 mg/dL (ref 0.61–1.24)
Glucose, Bld: 174 mg/dL — ABNORMAL HIGH (ref 65–99)
Potassium: 4.2 mmol/L (ref 3.5–5.1)
Sodium: 140 mmol/L (ref 135–145)

## 2015-03-10 LAB — C DIFFICILE QUICK SCREEN W PCR REFLEX
C DIFFICILE (CDIFF) INTERP: NEGATIVE
C DIFFICILE (CDIFF) TOXIN: NEGATIVE
C Diff antigen: NEGATIVE

## 2015-03-10 NOTE — Progress Notes (Signed)
[place] at [facility]                                                                                                                                                                                            Patient Demographics   [name], is a 74 y.o. male, DOB - 1941-09-12, SNK:[id number]  Admit date - 03/09/2015   Admitting Physician Henreitta Leber, MD  Outpatient Primary MD for the patient is No primary care provider on file.  LOS -   Chief Complaint  Patient presents with  . Rectal Bleeding       Patient complaint has multiple complaints, complains of chronic shortness of breath. Has had multiple visits for GI bleed at multiple institutions.  He was seen by Dr.  Allen Norris , yesterday due to his respiratory status he stated that he would like to monitor him unless endoscopy is absolutely needed.  Review of Systems:   CONSTITUTIONAL: No documented fever. No fatigue, weakness. No weight gain, no weight loss.  EYES: No blurry or double vision.  ENT: No tinnitus. No postnasal drip. No redness of the oropharynx.  RESPIRATORY: No cough, no wheeze, no hemoptysis. Chronic dyspnea.  CARDIOVASCULAR: No chest pain. No orthopnea. No palpitations. No syncope.  GASTROINTESTINAL: No nausea, no vomiting or diarrhea. No abdominal pain. Positive melena or hematochezia.  GENITOURINARY: No dysuria or hematuria.  ENDOCRINE: No polyuria or nocturia. No heat or cold intolerance.  HEMATOLOGY: No anemia. No bruising. No bleeding.  INTEGUMENTARY: No rashes. No lesions.  MUSCULOSKELETAL: No arthritis. No swelling. No gout.  NEUROLOGIC: No numbness, tingling, or ataxia. No seizure-type activity.  PSYCHIATRIC: No anxiety. No insomnia. No ADD.    Vitals:   Filed Vitals:   03/09/15 1956 03/10/15 0000 03/10/15 0800 03/10/15 1509  BP: 121/76 127/81 137/77 125/77  Pulse: 76 78 91 79  Temp: 97.3 F (36.3 C) 98.4 F (36.9 C) 98.1 F (36.7 C) 97.6 F (36.4 C)   TempSrc: Oral Oral Oral Oral  Resp: 18 18 18 20   Height:      Weight:      SpO2: 99% 95% 99% 98%    Wt Readings from Last 3 Encounters:  03/09/15 118.842 kg (262 lb)  02/13/15 117.028 kg (258 lb)  12/29/14 114.76 kg (253 lb)     Intake/Output Summary (Last 24 hours) at 03/10/15 1524 Last data filed at 03/10/15 1511  Gross per 24 hour  Intake   1380 ml  Output    850 ml  Net    530 ml    Physical Exam:   GENERAL: Pleasant-appearing in no  apparent distress.  HEAD, EYES, EARS, NOSE AND THROAT: Atraumatic, normocephalic. Extraocular muscles are intact. Pupils equal and reactive to light. Sclerae anicteric. No conjunctival injection. No oro-pharyngeal erythema.  NECK: Supple. There is no jugular venous distention. No bruits, no lymphadenopathy, no thyromegaly.  HEART: Regular rate and rhythm, tachycardic. No murmurs, no rubs, no clicks.  LUNGS: Clear to auscultation bilaterally. No rales or rhonchi. No wheezes.  ABDOMEN: Soft, flat, nontender, nondistended. Has good bowel sounds. No hepatosplenomegaly appreciated.  EXTREMITIES: No evidence of any cyanosis, clubbing, or peripheral edema.  +2 pedal and radial pulses bilaterally.  NEUROLOGIC: The patient is alert, awake, and oriented x3 with no focal motor or sensory deficits appreciated bilaterally.  SKIN: Moist and warm with no rashes appreciated.  Psych: Not anxious, depressed LN: No inguinal LN enlargement    Antibiotics   Anti-infectives    Start     Dose/Rate Route Frequency Ordered Stop   03/09/15 1230  doxycycline (VIBRA-TABS) tablet 100 mg     100 mg Oral 2 times daily 03/09/15 1218        Medications   Scheduled Meds: . atorvastatin  40 mg Oral QHS  . budesonide-formoterol  2 puff Inhalation BID  . citalopram  40 mg Oral Daily  . doxycycline  100 mg Oral BID  . folic acid  2 mg Oral q morning - 10a  . iron polysaccharides  150 mg Oral BID  . metFORMIN  500 mg Oral Q breakfast  . metoprolol tartrate  25 mg  Oral BID  . pantoprazole (PROTONIX) IV  40 mg Intravenous Q12H  . tiotropium  18 mcg Inhalation Daily   Continuous Infusions:  PRN Meds:.acetaminophen **OR** acetaminophen, albuterol, ALPRAZolam, guaiFENesin-dextromethorphan, ondansetron **OR** ondansetron (ZOFRAN) IV, zolpidem   Data Review:   Micro Results Recent Results (from the past 240 hour(s))  C difficile quick scan w PCR reflex [facility])     Status: None   Collection Time: 03/10/15 12:27 PM  Result Value Ref Range Status   C Diff antigen NEGATIVE  Final   C Diff toxin NEGATIVE  Final   C Diff interpretation Negative for C. difficile  Final    Radiology Reports No results found.   CBC  Recent Labs Lab 03/09/15 0901 03/09/15 1149 03/09/15 1818 03/10/15 0610  WBC 10.7*  --   --  8.8  HGB 10.3* 11.0* 10.2* 9.2*  HCT 32.3*  --   --  29.0*  PLT 241  --   --  234  MCV 89.2  --   --  89.1  MCH 28.6  --   --  28.4  MCHC 32.0  --   --  31.9*  RDW 15.2*  --   --  15.4*    Chemistries   Recent Labs Lab 03/09/15 0901 03/10/15 0610  NA 137 140  K 4.5 4.2  CL 102 103  CO2 27 28  GLUCOSE 207* 174*  BUN 32* 22*  CREATININE 0.97 1.04  CALCIUM 8.5* 8.5*  AST 17  --   ALT 11*  --   ALKPHOS 92  --   BILITOT 0.3  --    ------------------------------------------------------------------------------------------------------------------ estimated creatinine clearance is 87.3 mL/min (by C-G formula based on Cr of 1.04). ------------------------------------------------------------------------------------------------------------------ No results for input(s): HGBA1C in the last 72 hours. ------------------------------------------------------------------------------------------------------------------ No results for input(s): CHOL, HDL, LDLCALC, TRIG, CHOLHDL, LDLDIRECT in the last 72 hours. ------------------------------------------------------------------------------------------------------------------ No results for  input(s): TSH, T4TOTAL, T3FREE, THYROIDAB in the last 72 hours.  Invalid input(s): FREET3 ------------------------------------------------------------------------------------------------------------------  No results for input(s): VITAMINB12, FOLATE, FERRITIN, TIBC, IRON, RETICCTPCT in the last 72 hours.  Coagulation profile  Recent Labs Lab 03/09/15 0901  INR 1.03    No results for input(s): DDIMER in the last 72 hours.  Cardiac Enzymes  Recent Labs Lab 03/09/15 0901  TROPONINI 0.04*   ------------------------------------------------------------------------------------------------------------------ Invalid input(s): POCBNP    Assessment & Plan   74 year old male with past medical history of COPD on home O2, obesity, diabetes, history of AAA, history of previous GI bleed, allergic rhinitis, GERD, history of coronary disease, history of previous MI, who presents to the hospital due to black stools diarrhea. He was also noted to be weak and short of breath  #1 GI bleed - this is likely an upper GI bleed given his melanotic stools.  Appreciate GI input, monitor hemoglobin and it is slowly trending down transfuse if hemoglobin drops below 7,   #2 COPD - no acute exacerbation - continue Symbicort and Spiriva and as needed duo nebs  #3 depression continue Celexa  #4 diabetes continue metformin and sliding scale insulin  #5 hyperlipidemia continue atorvastatin  #6 generalized weakness - multifactorial due to GI bleed, kidney with underlying COPD morbid obesity and deconditioninseen by PT , patient was upset with the physical therapist.     Code Status Orders        Start     Ordered   03/09/15 1219  Full code   Continuous     03/09/15 1218      Family Communication:Patient   Disposition Plan: Home   Procedures none   ConsultsGI   DVT Prophylaxis SCDs  Lab Results  Component Value Date   PLT 234 03/10/2015     Time Spent in minutes   34min   Dustin Flock M.D on 03/10/2015 at 3:24 PM  Between 7am to 6pm - Pager - [phone]  After 6pm go to [email or web address] - password [email or web address] [facility]   Office  [phone]

## 2015-03-10 NOTE — Progress Notes (Signed)
Pt complained of new onset of dizziness. He stated, "It all of a sudden started laying in bed." Pt also complained of back pain, gave him PRN Tylenol. Will continue to monitor.

## 2015-03-10 NOTE — Progress Notes (Signed)
  Subjective: Patient notes dark stools x 3 today.  RN states dark brown with mucus.  Denies nausea, vomiting or abdominal pain.  Objective: Vital signs in last 24 hours: Temp:  [97.3 F (36.3 C)-98.4 F (36.9 C)] 98.1 F (36.7 C) (05/24 0800) Pulse Rate:  [76-91] 91 (05/24 0800) Resp:  [18] 18 (05/24 0800) BP: (121-187)/(76-86) 137/77 mmHg (05/24 0800) SpO2:  [95 %-100 %] 99 % (05/24 0800) Last BM Date: 03/10/15 No LMP for male patient. Body mass index is 33.2 kg/(m^2). General:   Alert,  Well-developed, well-nourished, pleasant and cooperative in NAD Head:  Normocephalic and atraumatic. Eyes:  Sclera clear, no icterus.   Conjunctiva pink. Mouth:  No deformity or lesions, oropharynx pink & moist. Neck:  Supple; no masses or thyromegaly. Heart:  Regular rate and rhythm; no murmurs, clicks, rubs, or gallops. Abdomen:   Obese, Normal bowel sounds.  Soft, nontender and nondistended. No masses, hepatosplenomegaly or hernias noted.  No guarding or rebound tenderness.   Msk:  Symmetrical without gross deformities. Good equal movement & strength bilaterally. Pulses:  Normal pulses noted. Extremities:  + clubbing.  No edema.  No cyanosis Neurologic:  Alert and  oriented x3;  grossly normal neurologically. Skin:  Intact without significant lesions or rashes. Cervical Nodes:  No significant cervical adenopathy. Psych:  Alert and cooperative. Normal mood and affect.  Intake/Output from previous day: 05/23 0701 - 05/24 0700 In: 900 [P.O.:900] Out: 800 [Urine:800]  Lab Results:  Recent Labs  03/09/15 0901 03/09/15 1149 03/09/15 1818 03/10/15 0610  WBC 10.7*  --   --  8.8  HGB 10.3* 11.0* 10.2* 9.2*  HCT 32.3*  --   --  29.0*  PLT 241  --   --  234   BMET  Recent Labs  03/09/15 0901 03/10/15 0610  NA 137 140  K 4.5 4.2  CL 102 103  CO2 27 28  GLUCOSE 207* 174*  BUN 32* 22*  CREATININE 0.97 1.04  CALCIUM 8.5* 8.5*   LFT  Recent Labs  03/09/15 0901  PROT 6.9    ALBUMIN 3.3*  AST 17  ALT 11*  ALKPHOS 92  BILITOT 0.3   PT/INR  Recent Labs  03/09/15 0901  LABPROT 13.7  INR 1.03   Assessment: #1  Melena: High endoscopic risk due to his advanced lung disease. 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  #2 Anemia:  Hgb stable as 1 gram drop likely dilutional.  Will continue to monitor for bleeding.  Plan: #1  Agree with PPI #2  Continue to monitor for bleeding with endoscopic intervention via EGD only if needed in near future.    Vickey Huger  03/10/2015, 12:30 PM [facility]  [place], [place] [address] Phone: [phone] Fax : [phone]

## 2015-03-10 NOTE — Evaluation (Signed)
Physical Therapy Evaluation Patient Details Name: [name] MRN: [id number] DOB: May 24, 1941 Today's Date: 03/10/2015   History of Present Illness  74 yo male with history of bloody stools and diarrhea was admitted with findings of elevated troponin and low hgb, Ca+.  Pt is chronic lung disease pt, memory loss, on home O2.  Clinical Impression  Pt was seen for continued work on his mobiltiy with lengthy time due to talking to him as he became agitated.  Pt is having some limited help at home and was upset about having a trip to SNF as PT suggested.  He wishes to go directly home despite his awareness of limited help and then he agreed he should do cardiac rehab if transport could later be arranged.  So progression from SNF to HHPT to outpatient cardiac rehab is hopefully going to be done.    Follow Up Recommendations SNF    Equipment Recommendations  Rolling walker with 5" wheels    Recommendations for Other Services       Precautions / Restrictions Precautions Precautions: Fall Precaution Comments: needs to be ckd for elevated pulses and O2 sats Restrictions Weight Bearing Restrictions: No      Mobility  Bed Mobility Overal bed mobility: Needs Assistance Bed Mobility: Supine to Sit;Sit to Supine     Supine to sit: Supervision;Min guard Sit to supine: Min guard;Supervision      Transfers Overall transfer level: Needs assistance   Transfers: Sit to/from [employer] Transfers Sit to Stand: Min guard;Min assist Stand pivot transfers: Min guard;Min assist       General transfer comment: reminders for hand placement  Ambulation/Gait Ambulation/Gait assistance: Min guard Ambulation Distance (Feet): 2 Feet Assistive device: 1 person hand held assist          Stairs            Wheelchair Mobility    Modified Rankin (Stroke Patients Only)       Balance Overall balance assessment: Needs assistance       Postural control: Posterior  lean Standing balance support: Bilateral upper extremity supported Standing balance-Leahy Scale: Fair Standing balance comment: fair- dynamic baalnce                             Pertinent Vitals/Pain Pain Assessment: No/denies pain    Home Living Family/patient expects to be discharged to:: Private residence Living Arrangements: Spouse/significant other Available Help at Discharge: Family Type of Home: House Home Access: Stairs to enter              Prior Function Level of Independence: Independent         Comments: states he doesn't have as much help as needed     Hand Dominance        Extremity/Trunk Assessment   Upper Extremity Assessment: Overall WFL for tasks assessed           Lower Extremity Assessment: Generalized weakness      Cervical / Trunk Assessment: Normal  Communication   Communication: HOH;Receptive difficulties  Cognition Arousal/Alertness: Awake/alert Behavior During Therapy: Agitated;Anxious;Impulsive Overall Cognitive Status: History of cognitive impairments - at baseline                      General Comments General comments (skin integrity, edema, etc.): Pt is having some difficulty with controlling anxiety and has controlled O2 sats and pulse, controlled BP with activity but did not attempt gait to  Exercises        Assessment/Plan    PT Assessment Patient needs continued PT services  PT Diagnosis Generalized weakness   PT Problem List Decreased strength;Decreased range of motion;Decreased activity tolerance;Decreased balance;Decreased mobility;Decreased coordination;Decreased cognition;Decreased knowledge of use of DME;Decreased safety awareness;Decreased knowledge of precautions;Cardiopulmonary status limiting activity;Obesity  PT Treatment Interventions DME instruction;Gait training;Stair training;Functional mobility training;Therapeutic activities;Therapeutic exercise;Balance training;Neuromuscular  re-education;Cognitive remediation;Patient/family education   PT Goals (Current goals can be found in the Care Plan section) Acute Rehab PT Goals Patient Stated Goal: to go home with help PT Goal Formulation: With patient Time For Goal Achievement: 03/24/15 Potential to Achieve Goals: Good    Frequency Min 2X/week   Barriers to discharge Inaccessible home environment;Decreased caregiver support      Co-evaluation               End of Session Equipment Utilized During Treatment: Oxygen Activity Tolerance: Patient tolerated treatment well;Patient limited by fatigue Patient left: in bed;with call bell/phone within reach Nurse Communication: Mobility status         Time: 1300-1355 PT Time Calculation (min) (ACUTE ONLY): 55 min   Charges:   PT Evaluation $Initial PT Evaluation Tier I: 1 Procedure PT Treatments $Therapeutic Exercise: 8-22 mins $Therapeutic Activity: 23-37 mins   PT G Codes:        [name] 03/25/2015, 3:59 PM  [name], PT MS Acute Rehab Dept. Number: [phone] [phone] and [place] [phone]

## 2015-03-10 NOTE — Telephone Encounter (Signed)
LMTC x 1 for [name] with [facility]

## 2015-03-11 ENCOUNTER — Telehealth: Payer: Self-pay | Admitting: Pulmonary Disease

## 2015-03-11 LAB — BASIC METABOLIC PANEL
Anion gap: 4 — ABNORMAL LOW (ref 5–15)
BUN: 16 mg/dL (ref 6–20)
CALCIUM: 8.3 mg/dL — AB (ref 8.9–10.3)
CHLORIDE: 104 mmol/L (ref 101–111)
CO2: 30 mmol/L (ref 22–32)
Creatinine, Ser: 1.01 mg/dL (ref 0.61–1.24)
GFR calc Af Amer: >60 mL/min (ref >60)
GFR calc non Af Amer: >60 mL/min (ref >60)
Glucose, Bld: 219 mg/dL — ABNORMAL HIGH (ref 65–99)
Potassium: 4.2 mmol/L (ref 3.5–5.1)
Sodium: 138 mmol/L (ref 135–145)

## 2015-03-11 LAB — CBC
HCT: 26.8 % — ABNORMAL LOW (ref 40.0–52.0)
Hemoglobin: 8.4 g/dL — ABNORMAL LOW (ref 13.0–18.0)
MCH: 28.4 pg (ref 26.0–34.0)
MCHC: 31.5 g/dL — ABNORMAL LOW (ref 32.0–36.0)
MCV: 90.1 fL (ref 80.0–100.0)
PLATELETS: 200 10*3/uL (ref 150–440)
RBC: 2.98 MIL/uL — ABNORMAL LOW (ref 4.40–5.90)
RDW: 15.2 % — AB (ref 11.5–14.5)
WBC: 6.6 10*3/uL (ref 3.8–10.6)

## 2015-03-11 LAB — HEMOGLOBIN: HEMOGLOBIN: 9.1 g/dL — AB (ref 13.0–18.0)

## 2015-03-11 NOTE — Telephone Encounter (Signed)
Patient needs to be re-qualified for O2.  Patient scheduled to see Dr. Lake Bells on 6/8.    FYI to [employer]

## 2015-03-11 NOTE — Progress Notes (Signed)
Patient was A&O X4, denied pain and N&V. Patient rested well after receiving PRN sleep med. He continue o have infrequent cough which was relieved with PRN cough med. Patient has remained stable and rested for most of the night. VS is WDL for patient.Will continue to monitor

## 2015-03-11 NOTE — Telephone Encounter (Signed)
Found out that basically this pt needs to be re-qualified for O2.  See TE 03/05/15 Nothing further needed.

## 2015-03-11 NOTE — Telephone Encounter (Signed)
I spoke to [facility] as she is currently in the office, she states that [facility] needs nothing from [place] as far s pt's 02 concerns.  Nothing further needed at this time.

## 2015-03-11 NOTE — Progress Notes (Signed)
  Subjective: 1 soft brown stool just now.  Denies nausea, vomiting or abdominal pain.  Objective: Vital signs in last 24 hours: Temp:  [97.6 F (36.4 C)-98.4 F (36.9 C)] 98.4 F (36.9 C) (05/25 0804) Pulse Rate:  [73-93] 73 (05/25 0804) Resp:  [18-20] 18 (05/25 0804) BP: (121-139)/(65-78) 139/78 mmHg (05/25 0804) SpO2:  [98 %-99 %] 99 % (05/25 0804) Last BM Date: 03/10/15 No LMP for male patient. Body mass index is 33.2 kg/(m^2). General:   Alert,  Well-developed, well-nourished, pleasant and cooperative in NAD Head:  Normocephalic and atraumatic. Eyes:  Sclera clear, no icterus.   Conjunctiva pink. Mouth:  No deformity or lesions, oropharynx pink & moist. Neck:  Supple; no masses or thyromegaly. Heart:  Regular rate and rhythm; no murmurs, clicks, rubs, or gallops. Abdomen:   Obese, Normal bowel sounds.  Soft, nontender and nondistended. No masses, hepatosplenomegaly or hernias noted.  No guarding or rebound tenderness.   Msk:  Symmetrical without gross deformities. Good equal movement & strength bilaterally. Pulses:  Normal pulses noted. Extremities:  + clubbing.  No edema.  No cyanosis Neurologic:  Alert and  oriented x3;  grossly normal neurologically. Skin:  Intact without significant lesions or rashes. Cervical Nodes:  No significant cervical adenopathy. Psych:  Alert and cooperative. Normal mood and affect.  Intake/Output from previous day: 05/24 0701 - 05/25 0700 In: 960 [P.O.:960] Out: 1050 [Urine:1050]  Lab Results:  Recent Labs  03/09/15 0901  03/09/15 1818 03/10/15 0610 03/11/15 0817  WBC 10.7*  --   --  8.8 6.6  HGB 10.3*  < > 10.2* 9.2* 8.4*  HCT 32.3*  --   --  29.0* 26.8*  PLT 241  --   --  234 200  < > = values in this interval not displayed. BMET  Recent Labs  03/09/15 0901 03/10/15 0610 03/11/15 0817  NA 137 140 138  K 4.5 4.2 4.2  CL 102 103 104  CO2 27 28 30   GLUCOSE 207* 174* 219*  BUN 32* 22* 16  CREATININE 0.97 1.04 1.01    CALCIUM 8.5* 8.5* 8.3*   LFT  Recent Labs  03/09/15 0901  PROT 6.9  ALBUMIN 3.3*  AST 17  ALT 11*  ALKPHOS 92  BILITOT 0.3   PT/INR  Recent Labs  03/09/15 0901  LABPROT 13.7  INR 1.03   Assessment: #1  Melena: No further melena today.  High endoscopic risk due to his advanced lung disease. 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  #2 Anemia:  Hgb has declined.  Will check H/H in AM.  NPO after MN in case EGD is needed. I have discussed his care with Dr Allen Norris.  Plan: #1  Continue PPI #2  H/H in AM with endoscopic intervention via EGD only if needed tomorrow #3 Continue supportive measures   Vickey Huger  03/11/2015, 1:15 PM [facility]  [place], [place] [address] Phone: [phone] Fax : [phone]

## 2015-03-11 NOTE — Progress Notes (Signed)
Inpatient Diabetes Program Recommendations  AACE/ADA: New Consensus Statement on Inpatient Glycemic Control (2013)  Target Ranges:  Prepandial:   less than 140 mg/dL      Peak postprandial:   less than 180 mg/dL (1-2 hours)      Critically ill patients:  140 - 180 mg/dL   Review of Glycemic Control:Results for PRAVEEN, COIA (MRN [id number]) as of 03/11/2015 13:30  Ref. Range 03/09/2015 09:01 03/10/2015 06:10 03/11/2015 08:17  Glucose Latest Ref Range: 65-99 mg/dL 207 (H) 174 (H) 219 (H)    Diabetes history: Type 2 diabetes Outpatient Diabetes medications: Metformin 500 mg daily  Due to history of diabetes, please consider adding "Glycemic Control Order" set, including CBG's tid with meals and HS with Novolog moderate correction tid with meals.  Thanks, Adah Perl, RN, BC-ADM Inpatient Diabetes Coordinator Pager [phone] (8a-5p)

## 2015-03-11 NOTE — Progress Notes (Signed)
[place] [facility]                                                                                                                                                                                            Patient Demographics   [name], is a 74 y.o. male, DOB - 10-09-1941, ZTI:[id number]  Admit date - 03/09/2015   Admitting Physician Henreitta Leber, MD  Outpatient Primary MD for the patient is No primary care provider on file.  LOS -   Chief Complaint  Patient presents with  . Rectal Bleeding       Patient feels okay but anxious about his hemoglobin dropping. He states that he has not had a bowel movement today.  Review of Systems:   CONSTITUTIONAL: No documented fever. No fatigue, weakness. No weight gain, no weight loss.  EYES: No blurry or double vision.  ENT: No tinnitus. No postnasal drip. No redness of the oropharynx.  RESPIRATORY: No cough, no wheeze, no hemoptysis. Chronic dyspnea.  CARDIOVASCULAR: No chest pain. No orthopnea. No palpitations. No syncope.  GASTROINTESTINAL: No nausea, no vomiting or diarrhea. No abdominal pain. Positive melena or hematochezia.  GENITOURINARY: No dysuria or hematuria.  ENDOCRINE: No polyuria or nocturia. No heat or cold intolerance.  HEMATOLOGY: No anemia. No bruising. No bleeding.  INTEGUMENTARY: No rashes. No lesions.  MUSCULOSKELETAL: No arthritis. No swelling. No gout.  NEUROLOGIC: No numbness, tingling, or ataxia. No seizure-type activity.  PSYCHIATRIC: No anxiety. No insomnia. No ADD.    Vitals:   Filed Vitals:   03/10/15 1509 03/10/15 2114 03/10/15 2237 03/11/15 0804  BP: 125/77 130/65 121/73 139/78  Pulse: 79 93 83 73  Temp: 97.6 F (36.4 C) 97.8 F (36.6 C)  98.4 F (36.9 C)  TempSrc: Oral Oral  Oral  Resp: 20 20  18   Height:      Weight:      SpO2: 98% 98%  99%    Wt Readings from Last 3 Encounters:  03/09/15 118.842 kg (262 lb)  02/13/15 117.028 kg (258 lb)   12/29/14 114.76 kg (253 lb)     Intake/Output Summary (Last 24 hours) at 03/11/15 1216 Last data filed at 03/11/15 1200  Gross per 24 hour  Intake    960 ml  Output   1300 ml  Net   -340 ml    Physical Exam:   GENERAL: Pleasant-appearing in no apparent distress.  HEAD, EYES, EARS, NOSE AND THROAT: Atraumatic, normocephalic. Extraocular muscles are intact. Pupils equal and reactive to light. Sclerae anicteric. No conjunctival injection. No oro-pharyngeal erythema.  NECK: Supple. There is  no jugular venous distention. No bruits, no lymphadenopathy, no thyromegaly.  HEART: Regular rate and rhythm, tachycardic. No murmurs, no rubs, no clicks.  LUNGS: Clear to auscultation bilaterally. No rales or rhonchi. No wheezes.  ABDOMEN: Soft, flat, nontender, nondistended. Has good bowel sounds. No hepatosplenomegaly appreciated.  EXTREMITIES: No evidence of any cyanosis, clubbing, or peripheral edema.  +2 pedal and radial pulses bilaterally.  NEUROLOGIC: The patient is alert, awake, and oriented x3 with no focal motor or sensory deficits appreciated bilaterally.  SKIN: Moist and warm with no rashes appreciated.  Psych: Not anxious, depressed LN: No inguinal LN enlargement    Antibiotics   Anti-infectives    Start     Dose/Rate Route Frequency Ordered Stop   03/09/15 1230  doxycycline (VIBRA-TABS) tablet 100 mg     100 mg Oral 2 times daily 03/09/15 1218        Medications   Scheduled Meds: . atorvastatin  40 mg Oral QHS  . budesonide-formoterol  2 puff Inhalation BID  . citalopram  40 mg Oral Daily  . doxycycline  100 mg Oral BID  . folic acid  2 mg Oral q morning - 10a  . iron polysaccharides  150 mg Oral BID  . metFORMIN  500 mg Oral Q breakfast  . metoprolol tartrate  25 mg Oral BID  . pantoprazole (PROTONIX) IV  40 mg Intravenous Q12H  . tiotropium  18 mcg Inhalation Daily   Continuous Infusions:  PRN Meds:.acetaminophen **OR** acetaminophen, albuterol, ALPRAZolam,  guaiFENesin-dextromethorphan, ondansetron **OR** ondansetron (ZOFRAN) IV, zolpidem   Data Review:   Micro Results Recent Results (from the past 240 hour(s))  C difficile quick scan w PCR reflex [facility])     Status: None   Collection Time: 03/10/15 12:27 PM  Result Value Ref Range Status   C Diff antigen NEGATIVE  Final   C Diff toxin NEGATIVE  Final   C Diff interpretation Negative for C. difficile  Final    Radiology Reports No results found.   CBC  Recent Labs Lab 03/09/15 0901 03/09/15 1149 03/09/15 1818 03/10/15 0610 03/11/15 0817  WBC 10.7*  --   --  8.8 6.6  HGB 10.3* 11.0* 10.2* 9.2* 8.4*  HCT 32.3*  --   --  29.0* 26.8*  PLT 241  --   --  234 200  MCV 89.2  --   --  89.1 90.1  MCH 28.6  --   --  28.4 28.4  MCHC 32.0  --   --  31.9* 31.5*  RDW 15.2*  --   --  15.4* 15.2*    Chemistries   Recent Labs Lab 03/09/15 0901 03/10/15 0610 03/11/15 0817  NA 137 140 138  K 4.5 4.2 4.2  CL 102 103 104  CO2 27 28 30   GLUCOSE 207* 174* 219*  BUN 32* 22* 16  CREATININE 0.97 1.04 1.01  CALCIUM 8.5* 8.5* 8.3*  AST 17  --   --   ALT 11*  --   --   ALKPHOS 92  --   --   BILITOT 0.3  --   --    ------------------------------------------------------------------------------------------------------------------ estimated creatinine clearance is 89.9 mL/min (by C-G formula based on Cr of 1.01). ------------------------------------------------------------------------------------------------------------------ No results for input(s): HGBA1C in the last 72 hours. ------------------------------------------------------------------------------------------------------------------ No results for input(s): CHOL, HDL, LDLCALC, TRIG, CHOLHDL, LDLDIRECT in the last 72 hours. ------------------------------------------------------------------------------------------------------------------ No results for input(s): TSH, T4TOTAL, T3FREE, THYROIDAB in the last 72 hours.  Invalid  input(s): FREET3 ------------------------------------------------------------------------------------------------------------------ No  results for input(s): VITAMINB12, FOLATE, FERRITIN, TIBC, IRON, RETICCTPCT in the last 72 hours.  Coagulation profile  Recent Labs Lab 03/09/15 0901  INR 1.03    No results for input(s): DDIMER in the last 72 hours.  Cardiac Enzymes  Recent Labs Lab 03/09/15 0901  TROPONINI 0.04*   ------------------------------------------------------------------------------------------------------------------ Invalid input(s): POCBNP    Assessment & Plan   74 year old male with past medical history of COPD on home O2, obesity, diabetes, history of AAA, history of previous GI bleed, allergic rhinitis, GERD, history of coronary disease, history of previous MI, who presents to the hospital due to black stools diarrhea. He was also noted to be weak and short of breath  #1 GI bleed - this is likely an upper GI bleed given his melanotic stools.  Appreciate GI input, continue to monitor his hemoglobin slowly trending down, we will repeat CBC tomorrow morning.   #2 COPD - no acute exacerbation - continue Symbicort and Spiriva and as needed duo nebs  #3 depression continue Celexa  #4 diabetes continue metformin and sliding scale insulin  #5 hyperlipidemia continue atorvastatin  #6 generalized weakness - multifactorial due to GI bleed, kidney with underlying COPD morbid obesity and deconditionin seen by PT ,       Code Status Orders        Start     Ordered   03/09/15 1219  Full code   Continuous     03/09/15 1218      Family Communication:Patient   Disposition Plan: Home   Procedures none   ConsultsGI   DVT Prophylaxis SCDs  Lab Results  Component Value Date   PLT 200 03/11/2015     Time Spent in minutes  30min   Dustin Flock M.D on 03/11/2015 at 12:16 PM  Between 7am to 6pm - Pager - [phone]  After 6pm go to  [email or web address] - password [email or web address] [facility]   Office  [phone]

## 2015-03-11 NOTE — Telephone Encounter (Signed)
LMTCB

## 2015-03-11 NOTE — Telephone Encounter (Signed)
Melissa returning call - [phone]

## 2015-03-12 LAB — CBC
HCT: 25.6 % — ABNORMAL LOW (ref 40.0–52.0)
HEMOGLOBIN: 8.3 g/dL — AB (ref 13.0–18.0)
MCH: 29.1 pg (ref 26.0–34.0)
MCHC: 32.4 g/dL (ref 32.0–36.0)
MCV: 89.9 fL (ref 80.0–100.0)
Platelets: 196 10*3/uL (ref 150–440)
RBC: 2.85 MIL/uL — AB (ref 4.40–5.90)
RDW: 15 % — AB (ref 11.5–14.5)
WBC: 6.8 10*3/uL (ref 3.8–10.6)

## 2015-03-12 MED ORDER — HYDROCODONE-ACETAMINOPHEN 5-325 MG PO TABS: 1.0000 | ORAL_TABLET | Freq: Two times a day (BID) | ORAL | Status: DC | PRN

## 2015-03-12 MED ORDER — SUCRALFATE 1 G PO TABS: 1.0000 g | ORAL_TABLET | Freq: Three times a day (TID) | ORAL | Status: DC

## 2015-03-12 NOTE — Progress Notes (Signed)
Patient given discharge orders and follow up appointments,  and also given prescriptions as ordered. IV discontinued to the left forearm site without s/s of infiltration or infection. Patient is alert and oriented, and states that wife will be here at 200 pm to take him home

## 2015-03-12 NOTE — Progress Notes (Addendum)
   Subjective: No further melena or rectal bleeding. Denies nausea, vomiting or abdominal pain.  Objective: Vital signs in last 24 hours: Temp:  [97.7 F (36.5 C)-98.1 F (36.7 C)] 97.7 F (36.5 C) (05/26 0834) Pulse Rate:  [66-69] 66 (05/26 0834) Resp:  [20] 20 (05/26 0834) BP: (115-131)/(53-59) 131/53 mmHg (05/26 0834) SpO2:  [97 %] 97 % (05/26 0834) Last BM Date: 03/11/15 No LMP for male patient. Body mass index is 33.2 kg/(m^2). General:   Alert,  Well-developed, well-nourished, pleasant and cooperative in NAD Head:  Normocephalic and atraumatic. Eyes:  Sclera clear, no icterus.   Conjunctiva pink. Mouth:  No deformity or lesions, oropharynx pink & moist. Neck:  Supple; no masses or thyromegaly. Heart:  Regular rate and rhythm; no murmurs, clicks, rubs, or gallops. Abdomen:   Obese, Normal bowel sounds.  Soft, nontender and nondistended. No masses, hepatosplenomegaly or hernias noted.  No guarding or rebound tenderness.   Msk:  Symmetrical without gross deformities. Good equal movement & strength bilaterally. Pulses:  Normal pulses noted. Extremities:  + clubbing.  No edema.  No cyanosis Neurologic:  Alert and  oriented x3;  grossly normal neurologically. Skin:  Intact without significant lesions or rashes. Cervical Nodes:  No significant cervical adenopathy. Psych:  Alert and cooperative. Normal mood and affect.  Intake/Output from previous day: 05/25 0701 - 05/26 0700 In: 720 [P.O.:720] Out: 1325 [Urine:1325]  Lab Results:  Recent Labs  03/10/15 0610 03/11/15 0817 03/11/15 1754 03/12/15 0630  WBC 8.8 6.6  --  6.8  HGB 9.2* 8.4* 9.1* 8.3*  HCT 29.0* 26.8*  --  25.6*  PLT 234 200  --  196   BMET  Recent Labs  03/10/15 0610 03/11/15 0817  NA 140 138  K 4.2 4.2  CL 103 104  CO2 28 30  GLUCOSE 174* 219*  BUN 22* 16  CREATININE 1.04 1.01  CALCIUM 8.5* 8.3*   Assessment: #1  Melena: No further melena in past 48 hours.  High endoscopic risk due to his  advanced lung disease. 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  #2 Anemia:  Hgb stable.   I have discussed his care with Dr Allen Norris.  Plan: #1  Continue PPI indefinitely #2  Resume heart healthy card modified diet #3  APPT [facility] Monday, June 6, arrive 3:15pm, [facility] #4  Continue iron daily   Vickey Huger  03/12/2015, 9:28 AM [facility]  [place], [place] [address] Phone: [phone] Fax : [phone]   Addendum: Pt states he had an upcoming appt with Dr Rayann Heman already scheduled.  I called to verify & there is no active upcoming appt, however patient was seen by Harrie Jeans, PA in September 2015.  Pt requests Dr Allen Norris at this time.
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
## 2015-03-12 NOTE — Discharge Instructions (Signed)
°  DIET:  Diabetic diet, cardiac diet  DISCHARGE CONDITION:  Stable  ACTIVITY:  Activity as tolerated  OXYGEN:  Home Oxygen: Yes.     Oxygen Delivery:2 liters/min via Patient connected to nasal cannula oxygen  DISCHARGE LOCATION:  home   If you experience worsening of your admission symptoms, develop shortness of breath, life threatening emergency, suicidal or homicidal thoughts you must seek medical attention immediately by calling 911 or calling your MD immediately  if symptoms less severe.  You Must read complete instructions/literature along with all the possible adverse reactions/side effects for all the Medicines you take and that have been prescribed to you. Take any new Medicines after you have completely understood and accpet all the possible adverse reactions/side effects.   Please note  You were cared for by a hospitalist during your hospital stay. If you have any questions about your discharge medications or the care you received while you were in the hospital after you are discharged, you can call the unit and asked to speak with the hospitalist on call if the hospitalist that took care of you is not available. Once you are discharged, your primary care physician will handle any further medical issues. Please note that NO REFILLS for any discharge medications will be authorized once you are discharged, as it is imperative that you return to your primary care physician (or establish a relationship with a primary care physician if you do not have one) for your aftercare needs so that they can reassess your need for medications and monitor your lab values.
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
## 2015-03-20 ENCOUNTER — Telehealth: Payer: Self-pay | Admitting: Pulmonary Disease

## 2015-03-20 ENCOUNTER — Ambulatory Visit: Payer: Commercial Managed Care - HMO | Admitting: Pulmonary Disease

## 2015-03-20 NOTE — Telephone Encounter (Signed)
Dr Lake Bells who knows the pt gave the best recs he could and I'm not going to be able to do better.   If condition worsens will need to go to UC or ER but best option is to get him in to see Tammy NP, BM or me early next week to regroup / sort out what's going on

## 2015-03-20 NOTE — Telephone Encounter (Signed)
Spoke with pt's wife. States that the pt is still having issues with coughing. OTC cough medication is not helping. Reports SOB and chest tightness as well. He had appointment with BQ today but canceled to feeling so bad. Would like a refill on Doxycycline.  BQ - please advise. Thanks.

## 2015-03-20 NOTE — Telephone Encounter (Signed)
He really needs to be seen by someone, this would be the third round of antibiotic.  He could have pneumonia or something worse so he needs a clinic visit.

## 2015-03-20 NOTE — Telephone Encounter (Signed)
Pt aware of rec's per BQ - refused appt. Offered apt with TP 3:45pm multiple times.  Pt is not wanting to come in and be seen- states that he feels too weak to go anywhere.  States that PT came out to home today wto work with him and he was unable to half the exercises they wanted him to do.  Pt also stated that the PT listened to his lungs today and said that they sounded clear.  Using Mucinex daily, seems breaks up cough but does not give full relief.  Reports wheezing at night which dissipates around lunch time everyday and returns nightly at bedtime.  Pt states that when he was seen in [facility] 03/08/14 they were not concerned with PNA or any lung infection. I advised the patient that even though they may not have seen anything then does not meant that there is not something going on and that he can have a lung infection or even PNA now.  Pt still refused OV. States that if he gets any worse over the weekend he will go to [facility] ED.  Will send to Dr Lake Bells as Juluis Rainier and for further rec's.

## 2015-03-20 NOTE — Telephone Encounter (Signed)
Will send this message to Dr Melvyn Novas (D.O.D) as Dr Lake Bells is off this afternoon.  Please note patient's response to BQ rec's as Dr Lake Bells was concerned of patient having possible PNA or something else going.  thanks

## 2015-03-20 NOTE — Telephone Encounter (Signed)
Called made pt aware. He still refused to make an appt. He reports if he feels like he is bad enough then he will go to ED. FYI for Dr. Lake Bells

## 2015-03-22 ENCOUNTER — Other Ambulatory Visit: Payer: Self-pay | Admitting: Cardiovascular Disease

## 2015-03-23 ENCOUNTER — Other Ambulatory Visit: Payer: Self-pay

## 2015-03-23 ENCOUNTER — Encounter: Payer: Self-pay | Admitting: Gastroenterology

## 2015-03-23 ENCOUNTER — Ambulatory Visit (INDEPENDENT_AMBULATORY_CARE_PROVIDER_SITE_OTHER): Payer: Commercial Managed Care - HMO | Admitting: Gastroenterology

## 2015-03-23 VITALS — BP 152/93 | HR 86 | Temp 98.2°F | Ht 74.0 in | Wt 262.0 lb

## 2015-03-23 DIAGNOSIS — K922 Gastrointestinal hemorrhage, unspecified: Secondary | ICD-10-CM

## 2015-03-23 NOTE — Telephone Encounter (Signed)
Rx has been sent to the pharmacy electronically. ° °

## 2015-03-23 NOTE — Assessment & Plan Note (Signed)
The patient is a 74 year old gentleman with a recent discharge for GI bleeding with anemia. The patient have his blood checked today for a CBC. The patient will also follow for any further signs of bleeding. If the patient does have any further bleeding he may need a small bowel enteroscopy to look for a small bowel source of his recurrent GI bleeds. The patient has been explained the plan and agrees with it.

## 2015-03-23 NOTE — Progress Notes (Signed)
Primary Care Physician: Dustin Flock, MD  Primary Gastroenterologist:  Dr. [name]  Chief Complaint  Patient presents with  . Hospitalization Follow-up  . GI Bleeding    HPI: [name] is a 74 y.o. male here for follow-up after hospital discharge. The patient has had multiple episodes of GI bleeding. 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 The patient had a treatment of the cecal AVMs from his last colonoscopy. The patient was also found in the past to have a gastric polyp. The patient was recently admitted to the hospital with anemia and dark stools.  Current Outpatient Prescriptions  Medication Sig Dispense Refill  . albuterol (PROVENTIL HFA;VENTOLIN HFA) 108 (90 BASE) MCG/ACT inhaler Inhale 2 puffs into the lungs every 6 (six) hours as needed for wheezing.    Marland Kitchen ALPRAZolam (XANAX) 0.5 MG tablet Take 1 tablet (0.5 mg total) by mouth 3 (three) times daily as needed for anxiety (for shortness of breath). 20 tablet 2  . atorvastatin (LIPITOR) 40 MG tablet Take 1 tablet (40 mg total) by mouth at bedtime. <please make appointment for refills> 90 tablet 0  . budesonide-formoterol (SYMBICORT) 160-4.5 MCG/ACT inhaler Inhale 2 puffs into the lungs 2 (two) times daily. 1 Inhaler 11  . Calcium Carbonate Antacid (ROLAIDS EXTRA STRENGTH PO) Take 2 tablets by mouth daily as needed (indigestion).    . citalopram (CELEXA) 40 MG tablet TAKE 1 TABLET (40 MG TOTAL) BY MOUTH DAILY. 30 tablet 5  . doxycycline (VIBRA-TABS) 100 MG tablet Take 1 tablet (100 mg total) by mouth 2 (two) times daily. 10 tablet 0  . folic acid (FOLVITE) 1 MG tablet Take 2 tablets (2 mg total) by mouth every morning. Need appointment before anymore refills 60 tablet 0  . HYDROcodone-acetaminophen (NORCO/VICODIN) 5-325 MG per tablet Take 1 tablet by mouth every 12 (twelve) hours as needed for moderate pain. 60 tablet 0  . iron polysaccharides (NIFEREX) 150 MG capsule Take 150 mg by mouth 2 (two) times  daily.    Marland Kitchen lisinopril (PRINIVIL,ZESTRIL) 5 MG tablet 5 mg daily.    . metFORMIN (GLUCOPHAGE) 500 MG tablet Take 1 tablet (500 mg total) by mouth daily. 30 tablet 1  . metoprolol tartrate (LOPRESSOR) 25 MG tablet Take 1 tablet (25 mg total) by mouth 2 (two) times daily. Need appointment before anymore refills 60 tablet 0  . pantoprazole (PROTONIX) 40 MG tablet Take 1 tablet (40 mg total) by mouth 2 (two) times daily. Need appointment before anymore refills 60 tablet 0  . sucralfate (CARAFATE) 1 G tablet Take 1 tablet (1 g total) by mouth 4 (four) times daily -  with meals and at bedtime. 90 tablet 0  . tiotropium (SPIRIVA) 18 MCG inhalation capsule Place 1 capsule (18 mcg total) into inhaler and inhale daily. 30 capsule 3  . zolpidem (AMBIEN) 5 MG tablet TAKE 1 TABLET BY MOUTH AT BEDTIME AS NEEDED FOR SLEEP 30 tablet 5  . pantoprazole (PROTONIX) 40 MG tablet Take 40 mg by mouth 2 (two) times daily.     No current facility-administered medications for this visit.    Allergies as of 03/23/2015 - Review Complete 03/23/2015  Allergen Reaction Noted  . Penicillins Anaphylaxis and Hives   . Demerol [meperidine] Other (See Comments) 04/06/2012  . Dilaudid [hydromorphone hcl] Other (See Comments) 12/04/2013  . Levaquin [levofloxacin in d5w]  02/14/2013  . Morphine and related Nausea Only and Other (See Comments) 04/06/2012    ROS:  General: Negative  for anorexia, weight loss, fever, chills, fatigue, weakness. ENT: Negative for hoarseness, difficulty swallowing , nasal congestion. CV: Negative for chest pain, angina, palpitations, dyspnea on exertion, peripheral edema.  Respiratory: Negative for dyspnea at rest, dyspnea on exertion, cough, sputum, wheezing.  GI: See history of present illness. GU:  Negative for dysuria, hematuria, urinary incontinence, urinary frequency, nocturnal urination.  Endo: Negative for unusual weight change.    Physical Examination:   BP 152/93 mmHg  Pulse 86   Temp(Src) 98.2 F (36.8 C) (Oral)  Ht 6\' 2"  (1.88 m)  Wt 262 lb (118.842 kg)  BMI 33.62 kg/m2  General: Well-nourished, well-developed in no acute distress.  Eyes: No icterus. Conjunctivae pink. Mouth: Oropharyngeal mucosa moist and pink , no lesions erythema or exudate. Lungs: Clear to auscultation bilaterally. Non-labored. Heart: Regular rate and rhythm, no murmurs rubs or gallops.  Abdomen: Bowel sounds are normal, nontender, nondistended, no hepatosplenomegaly or masses, no abdominal bruits or hernia , no rebound or guarding.   Extremities: No lower extremity edema. No clubbing or deformities. Neuro: Alert and oriented x 3.  Grossly intact. Skin: Warm and dry, no jaundice.   Psych: Alert and cooperative, normal mood and affect.   Imaging Studies: No results found.

## 2015-03-25 ENCOUNTER — Telehealth: Payer: Self-pay | Admitting: Gastroenterology

## 2015-03-25 ENCOUNTER — Ambulatory Visit: Payer: Commercial Managed Care - HMO | Admitting: Pulmonary Disease

## 2015-03-26 ENCOUNTER — Telehealth: Payer: Self-pay

## 2015-03-26 ENCOUNTER — Ambulatory Visit: Payer: Commercial Managed Care - HMO | Admitting: Pulmonary Disease

## 2015-03-26 NOTE — Telephone Encounter (Signed)
Noted. I will watch for labs.

## 2015-03-26 NOTE — Telephone Encounter (Signed)
NEEDS REFERRAL is Seeing Dr. Lake Bells  IC [id number] j43.9  Here today at 3:00   IF NO REFERRAL THEY WILL HAVE TO CANCEL  [facility]   Dr. Brigitte Pulse or Dr. Elder Cyphers   [facility] shows Dr. Elder Cyphers)  Judeen Hammans [phone]

## 2015-03-26 NOTE — Telephone Encounter (Signed)
Nurse that goes to pts house had the CBC drawn through her company, Watch for results

## 2015-04-07 ENCOUNTER — Ambulatory Visit: Payer: Commercial Managed Care - HMO | Admitting: Pulmonary Disease

## 2015-04-10 ENCOUNTER — Inpatient Hospital Stay
Admission: EM | Admit: 2015-04-10 | Discharge: 2015-04-17 | DRG: 190 | Disposition: A | Discharge status: Other Acute Inpatient Hospital | Payer: Commercial Managed Care - HMO | Attending: Internal Medicine | Admitting: Internal Medicine

## 2015-04-10 ENCOUNTER — Emergency Department: Payer: Commercial Managed Care - HMO

## 2015-04-10 ENCOUNTER — Encounter: Payer: Self-pay | Admitting: Emergency Medicine

## 2015-04-10 DIAGNOSIS — Z9981 Dependence on supplemental oxygen: Secondary | ICD-10-CM | POA: Diagnosis not present

## 2015-04-10 DIAGNOSIS — D500 Iron deficiency anemia secondary to blood loss (chronic): Secondary | ICD-10-CM | POA: Diagnosis present

## 2015-04-10 DIAGNOSIS — E785 Hyperlipidemia, unspecified: Secondary | ICD-10-CM | POA: Diagnosis present

## 2015-04-10 DIAGNOSIS — T380X5A Adverse effect of glucocorticoids and synthetic analogues, initial encounter: Secondary | ICD-10-CM | POA: Diagnosis present

## 2015-04-10 DIAGNOSIS — I5032 Chronic diastolic (congestive) heart failure: Secondary | ICD-10-CM | POA: Diagnosis present

## 2015-04-10 DIAGNOSIS — J441 Chronic obstructive pulmonary disease with (acute) exacerbation: Principal | ICD-10-CM | POA: Diagnosis present

## 2015-04-10 DIAGNOSIS — K254 Chronic or unspecified gastric ulcer with hemorrhage: Secondary | ICD-10-CM | POA: Diagnosis present

## 2015-04-10 DIAGNOSIS — J9611 Chronic respiratory failure with hypoxia: Secondary | ICD-10-CM | POA: Diagnosis present

## 2015-04-10 DIAGNOSIS — E1165 Type 2 diabetes mellitus with hyperglycemia: Secondary | ICD-10-CM | POA: Diagnosis present

## 2015-04-10 DIAGNOSIS — K449 Diaphragmatic hernia without obstruction or gangrene: Secondary | ICD-10-CM | POA: Diagnosis present

## 2015-04-10 DIAGNOSIS — I10 Essential (primary) hypertension: Secondary | ICD-10-CM | POA: Diagnosis present

## 2015-04-10 DIAGNOSIS — G894 Chronic pain syndrome: Secondary | ICD-10-CM | POA: Diagnosis present

## 2015-04-10 DIAGNOSIS — F332 Major depressive disorder, recurrent severe without psychotic features: Secondary | ICD-10-CM | POA: Diagnosis not present

## 2015-04-10 DIAGNOSIS — Z87891 Personal history of nicotine dependence: Secondary | ICD-10-CM | POA: Diagnosis not present

## 2015-04-10 DIAGNOSIS — K921 Melena: Secondary | ICD-10-CM | POA: Diagnosis not present

## 2015-04-10 DIAGNOSIS — R0600 Dyspnea, unspecified: Secondary | ICD-10-CM | POA: Diagnosis present

## 2015-04-10 DIAGNOSIS — F3162 Bipolar disorder, current episode mixed, moderate: Secondary | ICD-10-CM

## 2015-04-10 DIAGNOSIS — R0603 Acute respiratory distress: Secondary | ICD-10-CM

## 2015-04-10 LAB — CBC WITH DIFFERENTIAL/PLATELET
BLASTS: 0 %
Band Neutrophils: 2 % (ref 0–10)
Basophils Absolute: 0 10*3/uL (ref 0.0–0.1)
Basophils Relative: 0 % (ref 0–1)
Eosinophils Absolute: 0.4 10*3/uL (ref 0.0–0.7)
Eosinophils Relative: 3 % (ref 0–5)
HCT: 23.1 % — ABNORMAL LOW (ref 40.0–52.0)
Hemoglobin: 6.7 g/dL — ABNORMAL LOW (ref 13.0–18.0)
Lymphocytes Relative: 21 % (ref 12–46)
Lymphs Abs: 2.6 10*3/uL (ref 0.7–4.0)
MCH: 25.3 pg — AB (ref 26.0–34.0)
MCHC: 29.1 g/dL — ABNORMAL LOW (ref 32.0–36.0)
MCV: 86.9 fL (ref 80.0–100.0)
METAMYELOCYTES PCT: 0 %
MONOS PCT: 2 % — AB (ref 3–12)
Monocytes Absolute: 0.2 10*3/uL (ref 0.1–1.0)
Myelocytes: 0 %
Neutro Abs: 9.1 10*3/uL — ABNORMAL HIGH (ref 1.7–7.7)
Neutrophils Relative %: 72 % (ref 43–77)
Other: 0 %
PLATELETS: 337 10*3/uL (ref 150–440)
Promyelocytes Absolute: 0 %
RBC: 2.66 MIL/uL — ABNORMAL LOW (ref 4.40–5.90)
RDW: 17.6 % — ABNORMAL HIGH (ref 11.5–14.5)
WBC: 12.3 10*3/uL — AB (ref 3.8–10.6)
nRBC: 0 /100 WBC

## 2015-04-10 LAB — TROPONIN I

## 2015-04-10 LAB — BASIC METABOLIC PANEL
Anion gap: 6 (ref 5–15)
BUN: 21 mg/dL — ABNORMAL HIGH (ref 6–20)
CO2: 30 mmol/L (ref 22–32)
Calcium: 8.1 mg/dL — ABNORMAL LOW (ref 8.9–10.3)
Chloride: 100 mmol/L — ABNORMAL LOW (ref 101–111)
Creatinine, Ser: 0.9 mg/dL (ref 0.61–1.24)
Glucose, Bld: 214 mg/dL — ABNORMAL HIGH (ref 65–99)
POTASSIUM: 4.5 mmol/L (ref 3.5–5.1)
Sodium: 136 mmol/L (ref 135–145)

## 2015-04-10 LAB — GLUCOSE, CAPILLARY
Glucose-Capillary: 345 mg/dL — ABNORMAL HIGH (ref 65–99)
Glucose-Capillary: 369 mg/dL — ABNORMAL HIGH (ref 65–99)
Glucose-Capillary: 360 mg/dL — ABNORMAL HIGH (ref 65–99)

## 2015-04-10 LAB — PREPARE RBC (CROSSMATCH)

## 2015-04-10 MED ORDER — METHYLPREDNISOLONE SODIUM SUCC 125 MG IJ SOLR
INTRAMUSCULAR | Status: AC
  Filled 2015-04-10: qty 2

## 2015-04-10 MED ORDER — IPRATROPIUM-ALBUTEROL 0.5-2.5 (3) MG/3ML IN SOLN
RESPIRATORY_TRACT | Status: AC
  Filled 2015-04-10: qty 3

## 2015-04-10 MED ORDER — CITALOPRAM HYDROBROMIDE 20 MG PO TABS
40.0000 mg | ORAL_TABLET | Freq: Every day | ORAL | Status: DC
  Administered 2015-04-10 – 2015-04-17 (×8): 40 mg via ORAL
  Filled 2015-04-10 (×9): qty 2

## 2015-04-10 MED ORDER — INSULIN ASPART 100 UNIT/ML ~~LOC~~ SOLN
0.0000 [IU] | Freq: Three times a day (TID) | SUBCUTANEOUS | Status: DC
  Administered 2015-04-10: 9 [IU] via SUBCUTANEOUS
  Administered 2015-04-10: 7 [IU] via SUBCUTANEOUS
  Administered 2015-04-11 (×2): 9 [IU] via SUBCUTANEOUS
  Filled 2015-04-10: qty 9
  Filled 2015-04-10: qty 7
  Filled 2015-04-10 (×2): qty 9

## 2015-04-10 MED ORDER — INSULIN ASPART 100 UNIT/ML ~~LOC~~ SOLN
0.0000 [IU] | Freq: Every day | SUBCUTANEOUS | Status: DC
  Administered 2015-04-10: 5 [IU] via SUBCUTANEOUS
  Filled 2015-04-10: qty 5

## 2015-04-10 MED ORDER — METHYLPREDNISOLONE SODIUM SUCC 125 MG IJ SOLR
125.0000 mg | Freq: Once | INTRAMUSCULAR | Status: AC
  Administered 2015-04-10: 125 mg via INTRAVENOUS

## 2015-04-10 MED ORDER — IPRATROPIUM-ALBUTEROL 0.5-2.5 (3) MG/3ML IN SOLN
3.0000 mL | RESPIRATORY_TRACT | Status: DC
  Administered 2015-04-10 (×3): 3 mL via RESPIRATORY_TRACT
  Filled 2015-04-10 (×3): qty 3

## 2015-04-10 MED ORDER — IPRATROPIUM-ALBUTEROL 0.5-2.5 (3) MG/3ML IN SOLN
3.0000 mL | Freq: Once | RESPIRATORY_TRACT | Status: AC
  Administered 2015-04-10: 3 mL via RESPIRATORY_TRACT

## 2015-04-10 MED ORDER — ATORVASTATIN CALCIUM 20 MG PO TABS
40.0000 mg | ORAL_TABLET | Freq: Every day | ORAL | Status: DC
  Administered 2015-04-10 – 2015-04-16 (×7): 40 mg via ORAL
  Filled 2015-04-10 (×7): qty 2

## 2015-04-10 MED ORDER — TIOTROPIUM BROMIDE MONOHYDRATE 18 MCG IN CAPS
18.0000 ug | ORAL_CAPSULE | Freq: Every day | RESPIRATORY_TRACT | Status: DC
  Administered 2015-04-10 – 2015-04-17 (×7): 18 ug via RESPIRATORY_TRACT
  Filled 2015-04-10 (×2): qty 5

## 2015-04-10 MED ORDER — SODIUM CHLORIDE 0.9 % IV SOLN
10.0000 mL/h | Freq: Once | INTRAVENOUS | Status: AC
  Administered 2015-04-10: 10 mL/h via INTRAVENOUS

## 2015-04-10 MED ORDER — SUCRALFATE 1 G PO TABS
1.0000 g | ORAL_TABLET | Freq: Three times a day (TID) | ORAL | Status: DC
Start: 2015-04-10 — End: 2015-04-17
  Administered 2015-04-10 – 2015-04-17 (×30): 1 g via ORAL
  Filled 2015-04-10 (×29): qty 1

## 2015-04-10 MED ORDER — POLYSACCHARIDE IRON COMPLEX 150 MG PO CAPS
150.0000 mg | ORAL_CAPSULE | Freq: Two times a day (BID) | ORAL | Status: DC
  Administered 2015-04-10 – 2015-04-16 (×13): 150 mg via ORAL
  Administered 2015-04-16: 5 mg via ORAL
  Administered 2015-04-17: 150 mg via ORAL
  Filled 2015-04-10 (×16): qty 1

## 2015-04-10 MED ORDER — IPRATROPIUM-ALBUTEROL 0.5-2.5 (3) MG/3ML IN SOLN
3.0000 mL | Freq: Four times a day (QID) | RESPIRATORY_TRACT | Status: DC
  Administered 2015-04-11 (×2): 3 mL via RESPIRATORY_TRACT
  Filled 2015-04-10 (×2): qty 3

## 2015-04-10 MED ORDER — METHYLPREDNISOLONE SODIUM SUCC 40 MG IJ SOLR
40.0000 mg | Freq: Four times a day (QID) | INTRAMUSCULAR | Status: DC
  Administered 2015-04-10 – 2015-04-12 (×8): 40 mg via INTRAVENOUS
  Filled 2015-04-10 (×8): qty 1

## 2015-04-10 MED ORDER — HYDROCODONE-ACETAMINOPHEN 5-325 MG PO TABS
1.0000 | ORAL_TABLET | Freq: Two times a day (BID) | ORAL | Status: DC | PRN
  Administered 2015-04-10 – 2015-04-15 (×2): 1 via ORAL
  Filled 2015-04-10 (×2): qty 1

## 2015-04-10 MED ORDER — ACETAMINOPHEN 650 MG RE SUPP: 650.0000 mg | Freq: Four times a day (QID) | RECTAL | Status: DC | PRN

## 2015-04-10 MED ORDER — PANTOPRAZOLE SODIUM 40 MG IV SOLR
40.0000 mg | Freq: Two times a day (BID) | INTRAVENOUS | Status: DC
  Administered 2015-04-10 – 2015-04-12 (×5): 40 mg via INTRAVENOUS
  Filled 2015-04-10 (×5): qty 40

## 2015-04-10 MED ORDER — ALUM & MAG HYDROXIDE-SIMETH 200-200-20 MG/5ML PO SUSP
30.0000 mL | Freq: Four times a day (QID) | ORAL | Status: DC | PRN
  Administered 2015-04-10 – 2015-04-13 (×2): 30 mL via ORAL
  Filled 2015-04-10 (×2): qty 30

## 2015-04-10 MED ORDER — ACETAMINOPHEN 325 MG PO TABS
650.0000 mg | ORAL_TABLET | Freq: Four times a day (QID) | ORAL | Status: DC | PRN
  Administered 2015-04-10 – 2015-04-11 (×2): 650 mg via ORAL
  Filled 2015-04-10 (×2): qty 2

## 2015-04-10 MED ORDER — LISINOPRIL 5 MG PO TABS
5.0000 mg | ORAL_TABLET | Freq: Every day | ORAL | Status: DC
  Administered 2015-04-10 – 2015-04-17 (×8): 5 mg via ORAL
  Filled 2015-04-10 (×8): qty 1

## 2015-04-10 MED ORDER — ZOLPIDEM TARTRATE 5 MG PO TABS
5.0000 mg | ORAL_TABLET | Freq: Every evening | ORAL | Status: DC | PRN
  Administered 2015-04-10 – 2015-04-16 (×7): 5 mg via ORAL
  Filled 2015-04-10 (×7): qty 1

## 2015-04-10 MED ORDER — ALPRAZOLAM 0.5 MG PO TABS
0.5000 mg | ORAL_TABLET | Freq: Three times a day (TID) | ORAL | Status: DC | PRN
  Administered 2015-04-10 – 2015-04-17 (×17): 0.5 mg via ORAL
  Filled 2015-04-10 (×19): qty 1

## 2015-04-10 MED ORDER — ALBUTEROL SULFATE (2.5 MG/3ML) 0.083% IN NEBU
2.5000 mg | INHALATION_SOLUTION | RESPIRATORY_TRACT | Status: DC | PRN
  Administered 2015-04-11 – 2015-04-17 (×10): 2.5 mg via RESPIRATORY_TRACT
  Filled 2015-04-10 (×10): qty 3

## 2015-04-10 MED ORDER — BUDESONIDE-FORMOTEROL FUMARATE 160-4.5 MCG/ACT IN AERO
2.0000 | INHALATION_SPRAY | Freq: Two times a day (BID) | RESPIRATORY_TRACT | Status: DC
  Administered 2015-04-10 – 2015-04-17 (×14): 2 via RESPIRATORY_TRACT
  Filled 2015-04-10: qty 6

## 2015-04-10 NOTE — ED Notes (Signed)
Unable to get another iv access

## 2015-04-10 NOTE — ED Notes (Signed)
Adm md with pt 
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
## 2015-04-10 NOTE — ED Notes (Signed)
Pt reports having a cough for 3 days, tonight about 2 hours prior to arrival cough increased, pt reports he was having a hard time breathing. Pt arrives to ER awake, alert and oriented, talks in complete sentences, History of COPD pt is on 3 L [place] at all times.

## 2015-04-10 NOTE — ED Notes (Signed)
Pt reports RUQ pain for a long period gotten worse with cough.

## 2015-04-10 NOTE — Progress Notes (Addendum)
PT  S/P 1 UNIT OF BLOOD . TOLERATED WELL.RECEIVING 2ND UNIT BLOOD NOW. COLOR LESS PALE. STARTED ON CLEAR LIQUIDS. NO COFFEE . NO STOOLS THIS SHIFT. NO F/U CBC ORDERED . DR Bridgett Larsson NOTIFIED. MD TO RE-CHECK LEVEL IN AM. PT REFUSES SCDS BUT WILL ALLOW TEDS.Marland Kitchen ORDER PLACED  FOR TED. MD PLACED ORDER FOR CBC

## 2015-04-10 NOTE — Progress Notes (Signed)
PT REPORTS EXPERIENMCING SHOULDER ,THROAT AND NECK PAIN. BP 150/83. PULSE 116. DR Bridgett Larsson NOTIFIED. MD ORDERS MAALOX  30CC Q6HR PRN

## 2015-04-11 LAB — CBC
HEMATOCRIT: 26.5 % — AB (ref 40.0–52.0)
Hemoglobin: 8.1 g/dL — ABNORMAL LOW (ref 13.0–18.0)
MCH: 26.3 pg (ref 26.0–34.0)
MCHC: 30.7 g/dL — AB (ref 32.0–36.0)
MCV: 85.6 fL (ref 80.0–100.0)
Platelets: 285 10*3/uL (ref 150–440)
RBC: 3.1 MIL/uL — ABNORMAL LOW (ref 4.40–5.90)
RDW: 17.3 % — ABNORMAL HIGH (ref 11.5–14.5)
WBC: 11.5 10*3/uL — ABNORMAL HIGH (ref 3.8–10.6)

## 2015-04-11 LAB — GLUCOSE, CAPILLARY
GLUCOSE-CAPILLARY: 461 mg/dL — AB (ref 65–99)
GLUCOSE-CAPILLARY: 452 mg/dL — AB (ref 65–99)
Glucose-Capillary: 373 mg/dL — ABNORMAL HIGH (ref 65–99)
Glucose-Capillary: 360 mg/dL — ABNORMAL HIGH (ref 65–99)

## 2015-04-11 LAB — BASIC METABOLIC PANEL
ANION GAP: 11 (ref 5–15)
BUN: 19 mg/dL (ref 6–20)
CALCIUM: 8.4 mg/dL — AB (ref 8.9–10.3)
CHLORIDE: 99 mmol/L — AB (ref 101–111)
CO2: 25 mmol/L (ref 22–32)
Creatinine, Ser: 1.06 mg/dL (ref 0.61–1.24)
GFR calc Af Amer: >60 mL/min (ref >60)
GFR calc non Af Amer: >60 mL/min (ref >60)
Glucose, Bld: 374 mg/dL — ABNORMAL HIGH (ref 65–99)
POTASSIUM: 4.2 mmol/L (ref 3.5–5.1)
Sodium: 135 mmol/L (ref 135–145)

## 2015-04-11 LAB — OCCULT BLOOD X 1 CARD TO LAB, STOOL: FECAL OCCULT BLD: POSITIVE — AB

## 2015-04-11 LAB — TYPE AND SCREEN
ABO/RH(D): B NEG
Antibody Screen: NEGATIVE
UNIT DIVISION: 0
Unit division: 0

## 2015-04-11 MED ORDER — INSULIN ASPART 100 UNIT/ML ~~LOC~~ SOLN
15.0000 [IU] | Freq: Once | SUBCUTANEOUS | Status: AC
  Administered 2015-04-11: 15 [IU] via SUBCUTANEOUS
  Filled 2015-04-11: qty 15

## 2015-04-11 MED ORDER — INSULIN ASPART 100 UNIT/ML ~~LOC~~ SOLN: 0.0000 [IU] | Freq: Every day | SUBCUTANEOUS | Status: DC

## 2015-04-11 MED ORDER — AZITHROMYCIN 250 MG PO TABS
500.0000 mg | ORAL_TABLET | Freq: Every day | ORAL | Status: DC
  Administered 2015-04-11 – 2015-04-17 (×7): 500 mg via ORAL
  Filled 2015-04-11 (×7): qty 2

## 2015-04-11 MED ORDER — GUAIFENESIN ER 600 MG PO TB12
600.0000 mg | ORAL_TABLET | Freq: Two times a day (BID) | ORAL | Status: DC
  Administered 2015-04-11 – 2015-04-17 (×12): 600 mg via ORAL
  Filled 2015-04-11 (×12): qty 1

## 2015-04-11 MED ORDER — INSULIN ASPART 100 UNIT/ML ~~LOC~~ SOLN: 0.0000 [IU] | Freq: Three times a day (TID) | SUBCUTANEOUS | Status: DC

## 2015-04-11 MED ORDER — INSULIN ASPART 100 UNIT/ML ~~LOC~~ SOLN
0.0000 [IU] | Freq: Three times a day (TID) | SUBCUTANEOUS | Status: DC
  Administered 2015-04-12: 15 [IU] via SUBCUTANEOUS
  Administered 2015-04-12: 8 [IU] via SUBCUTANEOUS
  Administered 2015-04-12: 15 [IU] via SUBCUTANEOUS
  Administered 2015-04-12: 5 [IU] via SUBCUTANEOUS
  Administered 2015-04-13 (×2): 8 [IU] via SUBCUTANEOUS
  Administered 2015-04-13: 15 [IU] via SUBCUTANEOUS
  Administered 2015-04-14: 5 [IU] via SUBCUTANEOUS
  Administered 2015-04-14: 15 [IU] via SUBCUTANEOUS
  Administered 2015-04-14: 5 [IU] via SUBCUTANEOUS
  Administered 2015-04-14: 15 [IU] via SUBCUTANEOUS
  Administered 2015-04-15: 3 [IU] via SUBCUTANEOUS
  Administered 2015-04-15: 5 [IU] via SUBCUTANEOUS
  Administered 2015-04-15: 2 [IU] via SUBCUTANEOUS
  Administered 2015-04-15: 8 [IU] via SUBCUTANEOUS
  Administered 2015-04-16: 5 [IU] via SUBCUTANEOUS
  Administered 2015-04-16: 2 [IU] via SUBCUTANEOUS
  Administered 2015-04-16: 3 [IU] via SUBCUTANEOUS
  Administered 2015-04-16: 8 [IU] via SUBCUTANEOUS
  Administered 2015-04-17 (×3): 3 [IU] via SUBCUTANEOUS
  Filled 2015-04-11: qty 8
  Filled 2015-04-11: qty 5
  Filled 2015-04-11: qty 16
  Filled 2015-04-11: qty 5
  Filled 2015-04-11: qty 15
  Filled 2015-04-11: qty 5
  Filled 2015-04-11: qty 8
  Filled 2015-04-11: qty 3
  Filled 2015-04-11: qty 15
  Filled 2015-04-11: qty 5
  Filled 2015-04-11 (×2): qty 3
  Filled 2015-04-11 (×3): qty 8
  Filled 2015-04-11: qty 2
  Filled 2015-04-11: qty 15
  Filled 2015-04-11: qty 2
  Filled 2015-04-11 (×2): qty 15
  Filled 2015-04-11: qty 3
  Filled 2015-04-11: qty 5

## 2015-04-11 MED ORDER — DM-GUAIFENESIN ER 30-600 MG PO TB12
1.0000 | ORAL_TABLET | Freq: Two times a day (BID) | ORAL | Status: DC
  Filled 2015-04-11 (×2): qty 1

## 2015-04-11 MED ORDER — IPRATROPIUM-ALBUTEROL 0.5-2.5 (3) MG/3ML IN SOLN
3.0000 mL | Freq: Three times a day (TID) | RESPIRATORY_TRACT | Status: DC
  Administered 2015-04-11: 3 mL via RESPIRATORY_TRACT
  Filled 2015-04-11: qty 3

## 2015-04-11 MED ORDER — DEXTROMETHORPHAN POLISTIREX ER 30 MG/5ML PO SUER
30.0000 mg | Freq: Two times a day (BID) | ORAL | Status: DC
  Administered 2015-04-11 – 2015-04-17 (×12): 30 mg via ORAL
  Filled 2015-04-11 (×15): qty 5

## 2015-04-11 NOTE — Consult Note (Signed)
Pt last EGD was 12/05/2014 at [facility] and had large hiatal hernia and erosions in cardia with surrounding blood.  Also had erythematous folds at the diaphragmatic pinch.  Likely a source of bleeding.  Also seen in past were tiny erythematous spots in cecum possible AVM and were treated.  He has signif lung disease with 3-4L Oxygen on 24/7 basis.  His stools are black but hgb came up after 2 units and his color looks good today.  Chest with global decreased flow but no rales or ronchi.  He likely has Cameron erosions with his hiatal hernia sometimes aggravated with NSAID use.  Given his lung disease I would like to not scope him unless he is actively bleeding.  Will add carafate slurry to his regimen and recommend avoid NSAID if possible.  Advance diet to full liquid

## 2015-04-11 NOTE — Progress Notes (Signed)
[place] [facility] [place] NAME: [name]    MR#:  [id number]  DATE OF BIRTH:  19-Mar-1941  SUBJECTIVE:  CHIEF COMPLAINT:   Chief Complaint  Patient presents with  . Cough  Still cough, SOB and weakness. Sore throat.  REVIEW OF SYSTEMS:  CONSTITUTIONAL: No fever, has generalized weakness.  EYES: No blurred or double vision.  EARS, NOSE, AND THROAT: No tinnitus or ear pain.  RESPIRATORY: Positive cough, sputum, shortness of breath and wheezing but no hemoptysis.  CARDIOVASCULAR: No chest pain, orthopnea, edema.  GASTROINTESTINAL: No nausea, vomiting, diarrhea or abdominal pain.  GENITOURINARY: No dysuria, hematuria.  ENDOCRINE: No polyuria, nocturia,  HEMATOLOGY: No anemia, easy bruising or bleeding SKIN: No rash or lesion. MUSCULOSKELETAL: No joint pain or arthritis.   NEUROLOGIC: No tingling, numbness, weakness.  PSYCHIATRY: No anxiety or depression.   DRUG ALLERGIES:   Allergies  Allergen Reactions  . Penicillins Anaphylaxis and Hives  . Demerol [Meperidine] Other (See Comments)    hallucinations  . Dilaudid [Hydromorphone Hcl] Other (See Comments)    hallucinations  . Levaquin [Levofloxacin In D5w]     Nausea and diarrhea  . Morphine And Related Nausea Only and Other (See Comments)    hallucinations    VITALS:  Blood pressure 136/80, pulse 109, temperature 98.2 F (36.8 C), temperature source Oral, resp. rate 24, height 6\' 3"  (1.905 m), weight 117.391 kg (258 lb 12.8 oz), SpO2 98 %.  PHYSICAL EXAMINATION:  GENERAL:  74 y.o.-year-old patient lying in the bed with no acute distress.  EYES: Pupils equal, round, reactive to light and accommodation. No scleral icterus. Extraocular muscles intact.  HEENT: Head atraumatic, normocephalic. Oropharynx and nasopharynx clear.  NECK:  Supple, no jugular venous distention. No thyroid enlargement, no tenderness.  LUNGS: Normal breath sounds bilaterally, mild to moderate  wheezing and rhonchi. No use of accessory muscles of respiration.  CARDIOVASCULAR: S1, S2 normal. No murmurs, rubs, or gallops.  ABDOMEN: Soft, nontender, nondistended. Bowel sounds present. No organomegaly or mass.  EXTREMITIES: No pedal edema, cyanosis, or clubbing.  NEUROLOGIC: Cranial nerves II through XII are intact. Muscle strength 5/5 in all extremities. Sensation intact. Gait not checked.  PSYCHIATRIC: The patient is alert and oriented x 3.  SKIN: No obvious rash, lesion, or ulcer.    LABORATORY PANEL:   CBC  Recent Labs Lab 04/11/15 0355  WBC 11.5*  HGB 8.1*  HCT 26.5*  PLT 285   ------------------------------------------------------------------------------------------------------------------  Chemistries   Recent Labs Lab 04/11/15 0355  NA 135  K 4.2  CL 99*  CO2 25  GLUCOSE 374*  BUN 19  CREATININE 1.06  CALCIUM 8.4*   ------------------------------------------------------------------------------------------------------------------  Cardiac Enzymes  Recent Labs Lab 04/10/15 0600  TROPONINI <0.03   ------------------------------------------------------------------------------------------------------------------  RADIOLOGY:  Dg Chest Port 1 View  04/10/2015   CLINICAL DATA:  Acute onset of cough and difficulty breathing. Initial encounter.  EXAM: PORTABLE CHEST - 1 VIEW  COMPARISON:  Chest radiograph performed 12/04/2013  FINDINGS: The lungs are well-aerated. Mild left basilar opacity likely reflects atelectasis. There is no evidence of pleural effusion or pneumothorax.  The cardiomediastinal silhouette is mildly enlarged. The patient is status post median sternotomy. No acute osseous abnormalities are seen.  IMPRESSION: Mild left basilar opacity likely reflects atelectasis. Mild cardiomegaly.   Electronically Signed   By: Garald Balding M.D.   On: 04/10/2015 05:59    EKG:   Orders placed or performed during the hospital encounter of 04/10/15  .  EKG  12-Lead  . EKG 12-Lead  . EKG 12-Lead  . EKG 12-Lead    ASSESSMENT AND PLAN:   COPD exacerbation. Chronic respiratory failure.  continue IV Solu-Medrol, zithromax, DuoNeb, Symbicort and Spiriva. Continue oxygen [place] 3L.  GIB with Symptomatic anemia.   S/p 2 unit PRBC transfusion. Melena with positive stool occult, advanced to full liquid diet and continue Protonix and carafate per Dr. Tiffany Kocher. Follow-up CBC.  Leukocytosis. Resovled.  DM. hold metformin and start a sliding scale.  Chronic diastolic CHF. Stable.  Hypertension. Lisinopril.   All the records are reviewed and case discussed with Care Management/Social Workerr. Management plans discussed with the patient, family and they are in agreement.  CODE STATUS: full code.  TOTAL TIME TAKING CARE OF THIS PATIENT: 46  minutes.   POSSIBLE D/C IN 3-4 DAYS, DEPENDING ON CLINICAL CONDITION.   Demetrios Loll M.D on 04/11/2015 at 1:03 PM  Between 7am to 6pm - Pager - [phone]  After 6pm go to [email or web address] - password [email or web address] [place] [facility]  Office  [phone]  CC: Primary care physician; Dustin Flock, MD

## 2015-04-11 NOTE — Progress Notes (Signed)
Patient very irritable and complaining about services and what he is being told.  Advised that he wanted things that a diabetic should not be getting and he stated that he is prediabetic and is NOT a true diabetic, was not willing to listen to education about diabetes.  He stated he had never been told what he could and could not eat/drink.  I advised I would get a diabetic coordinator in and he refused because he was not a diabetic.

## 2015-04-11 NOTE — Progress Notes (Signed)
Took patient at 3pm this afternoon. Pt A&O. Patient complains that he want ice cream but he is on a full liquid diet. Refuses diabetes education at this time.  Spoke to Dr. Bridgett Larsson re: patients 461 blood sugar. Telephone order for 15 Units 1 dose & then change order to moderate sliding scale coverage. Continue to monitor patient.

## 2015-04-12 LAB — BASIC METABOLIC PANEL
ANION GAP: 10 (ref 5–15)
BUN: 20 mg/dL (ref 6–20)
CO2: 26 mmol/L (ref 22–32)
Calcium: 8.5 mg/dL — ABNORMAL LOW (ref 8.9–10.3)
Chloride: 98 mmol/L — ABNORMAL LOW (ref 101–111)
Creatinine, Ser: 1.01 mg/dL (ref 0.61–1.24)
GFR calc non Af Amer: >60 mL/min (ref >60)
GLUCOSE: 311 mg/dL — AB (ref 65–99)
POTASSIUM: 4.3 mmol/L (ref 3.5–5.1)
SODIUM: 134 mmol/L — AB (ref 135–145)

## 2015-04-12 LAB — CBC
HCT: 26.2 % — ABNORMAL LOW (ref 40.0–52.0)
Hemoglobin: 8.1 g/dL — ABNORMAL LOW (ref 13.0–18.0)
MCH: 26.2 pg (ref 26.0–34.0)
MCHC: 30.9 g/dL — ABNORMAL LOW (ref 32.0–36.0)
MCV: 84.8 fL (ref 80.0–100.0)
Platelets: 293 10*3/uL (ref 150–440)
RBC: 3.09 MIL/uL — ABNORMAL LOW (ref 4.40–5.90)
RDW: 17 % — ABNORMAL HIGH (ref 11.5–14.5)
WBC: 13.4 10*3/uL — ABNORMAL HIGH (ref 3.8–10.6)

## 2015-04-12 LAB — GLUCOSE, CAPILLARY
GLUCOSE-CAPILLARY: 364 mg/dL — AB (ref 65–99)
Glucose-Capillary: 388 mg/dL — ABNORMAL HIGH (ref 65–99)
Glucose-Capillary: 276 mg/dL — ABNORMAL HIGH (ref 65–99)
Glucose-Capillary: 221 mg/dL — ABNORMAL HIGH (ref 65–99)

## 2015-04-12 MED ORDER — ALPRAZOLAM 0.25 MG PO TABS
0.2500 mg | ORAL_TABLET | Freq: Once | ORAL | Status: AC
Start: 2015-04-12 — End: 2015-04-12
  Administered 2015-04-12: 0.25 mg via ORAL
  Filled 2015-04-12: qty 1

## 2015-04-12 MED ORDER — PANTOPRAZOLE SODIUM 40 MG PO TBEC
40.0000 mg | DELAYED_RELEASE_TABLET | Freq: Two times a day (BID) | ORAL | Status: DC
  Administered 2015-04-12 – 2015-04-17 (×10): 40 mg via ORAL
  Filled 2015-04-12 (×10): qty 1

## 2015-04-12 MED ORDER — INSULIN GLARGINE 100 UNIT/ML ~~LOC~~ SOLN
15.0000 [IU] | Freq: Every day | SUBCUTANEOUS | Status: DC
  Administered 2015-04-12 – 2015-04-13 (×2): 15 [IU] via SUBCUTANEOUS
  Filled 2015-04-12 (×3): qty 0.15

## 2015-04-12 MED ORDER — PREDNISONE 20 MG PO TABS
40.0000 mg | ORAL_TABLET | Freq: Every day | ORAL | Status: DC
  Administered 2015-04-13 – 2015-04-15 (×3): 40 mg via ORAL
  Filled 2015-04-12 (×3): qty 2

## 2015-04-12 MED ORDER — INSULIN ASPART 100 UNIT/ML ~~LOC~~ SOLN
5.0000 [IU] | Freq: Three times a day (TID) | SUBCUTANEOUS | Status: DC
  Administered 2015-04-12 – 2015-04-14 (×7): 5 [IU] via SUBCUTANEOUS
  Filled 2015-04-12: qty 5
  Filled 2015-04-12: qty 10
  Filled 2015-04-12 (×6): qty 5

## 2015-04-12 NOTE — Progress Notes (Signed)
[facility] [place] NAME: Kymir Coles    MR#:  [id number]  DATE OF BIRTH:  04-09-1941  SUBJECTIVE:  CHIEF COMPLAINT:   Chief Complaint  Patient presents with  . Cough  better cough, SOB.  REVIEW OF SYSTEMS:  CONSTITUTIONAL: No fever, has generalized weakness.  EYES: No blurred or double vision.  EARS, NOSE, AND THROAT: No tinnitus or ear pain.  RESPIRATORY: Positive cough, sputum, shortness of breath and wheezing but no hemoptysis.  CARDIOVASCULAR: No chest pain, orthopnea, edema.  GASTROINTESTINAL: No nausea, vomiting, diarrhea or abdominal pain.  GENITOURINARY: No dysuria, hematuria.  ENDOCRINE: No polyuria, nocturia,  HEMATOLOGY: No anemia, easy bruising or bleeding SKIN: No rash or lesion. MUSCULOSKELETAL: No joint pain or arthritis.   NEUROLOGIC: No tingling, numbness, weakness.  PSYCHIATRY: No anxiety or depression.   DRUG ALLERGIES:   Allergies  Allergen Reactions  . Penicillins Anaphylaxis and Hives  . Demerol [Meperidine] Other (See Comments)    hallucinations  . Dilaudid [Hydromorphone Hcl] Other (See Comments)    hallucinations  . Levaquin [Levofloxacin In D5w]     Nausea and diarrhea  . Morphine And Related Nausea Only and Other (See Comments)    hallucinations    VITALS:  Blood pressure 131/64, pulse 112, temperature 97.5 F (36.4 C), temperature source Oral, resp. rate 20, height 6\' 3"  (1.905 m), weight 117.391 kg (258 lb 12.8 oz), SpO2 97 %.  PHYSICAL EXAMINATION:  GENERAL:  74 y.o.-year-old patient lying in the bed with no acute distress.  EYES: Pupils equal, round, reactive to light and accommodation. No scleral icterus. Extraocular muscles intact.  HEENT: Head atraumatic, normocephalic. Oropharynx and nasopharynx clear.  NECK:  Supple, no jugular venous distention. No thyroid enlargement, no tenderness.  LUNGS: Normal breath sounds bilaterally, no wheezing and rhonchi. No use of accessory  muscles of respiration.  CARDIOVASCULAR: S1, S2 normal. No murmurs, rubs, or gallops.  ABDOMEN: Soft, nontender, nondistended. Bowel sounds present. No organomegaly or mass.  EXTREMITIES: No pedal edema, cyanosis, or clubbing.  NEUROLOGIC: Cranial nerves II through XII are intact. Muscle strength 4/5 in all extremities. Sensation intact. Gait not checked.  PSYCHIATRIC: The patient is alert and oriented x 3.  SKIN: No obvious rash, lesion, or ulcer.    LABORATORY PANEL:   CBC  Recent Labs Lab 04/12/15 0428  WBC 13.4*  HGB 8.1*  HCT 26.2*  PLT 293   ------------------------------------------------------------------------------------------------------------------  Chemistries   Recent Labs Lab 04/12/15 0428  NA 134*  K 4.3  CL 98*  CO2 26  GLUCOSE 311*  BUN 20  CREATININE 1.01  CALCIUM 8.5*   ------------------------------------------------------------------------------------------------------------------  Cardiac Enzymes  Recent Labs Lab 04/10/15 0600  TROPONINI <0.03   ------------------------------------------------------------------------------------------------------------------  RADIOLOGY:  No results found.  EKG:   Orders placed or performed during the hospital encounter of 04/10/15  . EKG 12-Lead  . EKG 12-Lead  . EKG 12-Lead  . EKG 12-Lead    ASSESSMENT AND PLAN:   COPD exacerbation. Chronic respiratory failure.  discontinue IV Solu-Medrol and change to prednisone,  Cont zithromax, DuoNeb, Symbicort and Spiriva. Continue oxygen Gregory 3L.  GIB with Symptomatic anemia.  Possible due to e gastric erosion  per Dr. Tiffany Kocher.. S/p 2 unit PRBC transfusion. Continue full liquid diet, add mashed potatoes and scrambled eggs and advance to mechanical soft diet tomorrow, continue Protonix and carafate per Dr. Tiffany Kocher. Follow-up CBC.  Leukocytosis. Due to steroid.  DM with hyperglycemia. Start on Lantus with a moderate  sliding scale. hold metformin.  Chronic  diastolic CHF. Stable.  Hypertension. Lisinopril.  Discussed with Dr. Tiffany Kocher.  All the records are reviewed and case discussed with Care Management/Social Workerr. Management plans discussed with the patient, family and they are in agreement.  CODE STATUS: full code.  TOTAL TIME TAKING CARE OF THIS PATIENT: 38   minutes.   POSSIBLE D/C IN 2 DAYS, DEPENDING ON CLINICAL CONDITION.   Demetrios Loll M.D on 04/12/2015 at 10:03 AM  Between 7am to 6pm - Pager - [phone]  After 6pm go to [email or web address] - password [email or web address] [facility]  Office  [phone]  CC: Primary care physician; Dustin Flock, MD

## 2015-04-12 NOTE — Care Management Note (Signed)
Case Management Note  Patient Details  Name: [name] MRN: [id number] Date of Birth: 1941/03/24  Subjective/Objective:                  Patient admitted for COPD exacerbation. He states he has not received the Gold card which comes in packet. RN/clerk can call 2A or Director on 2a [place]ice) to obtain card. Patient instructed on use of Gold card each admission His new PCP is  Dr. Posey Pronto which is new PCP at [facility] o2 he thinks through [facility]. He states that O2 is used chronically. Home health open for PT and RN with [facility] also he thinks. He has 4 steps with railings both sides and one level home.  Difficulty with sob especially with ambulation. Nebulizer at home. Independent with ambulation until this episode. Has walker front-wheeled but denies using due to limited mobility. Lives with wife and a friend lives there also. He denies being educated on diabetes. He has a glucometer. RN to follow up and or contact diabetes inpatient nurse for consult. [facility]ords rx and [facility] [place]. He states on last admission he was discharged to home but when he got home he barely made it into the home due to fatigue/shortness of breathe. He would benefit from PT evaluation since he is currently receiving it at home and to decrease further weakness.   Action/Plan:  I encouraged patient to ask RN to assist with ambulation. Flutter valve would also be helpful for patient to start here at hospital and use at home. I will follow up with home health agency to check status. Resumption of home health order needed along with new face-to-face document. RNCM to continue to follow.   Expected Discharge Date:                  Expected Discharge Plan:     In-House Referral:     Discharge planning Services  CM Consult  Post Acute Care Choice:    Choice offered to:  Patient  DME Arranged:    DME Agency:     HH Arranged:    [place] Agency:     Status of Service:     Medicare Important  Message Given:    Date Medicare IM Given:    Medicare IM give by:    Date Additional Medicare IM Given:    Additional Medicare Important Message give by:     If discussed at [place] of Stay Meetings, dates discussed:    Additional Comments:  Marshell Garfinkel, RN 04/12/2015, 12:17 PM

## 2015-04-12 NOTE — Consult Note (Signed)
Pt still with some abd pain, dark stools but a little more green per nurse.  Hgb 8.1, WBC 13.4 plt 293, pt eating non diabetic things and glucose up to 461.  I drew a picture of a hiatal hernia and how it can cause gastric erosions at the diaphragmatic pinch and that carafate slurry and PPI should help to heal this or lessen the severity.  Explained that NSAID are not good for this.  Hg b stable at 8.1, plt 293.  No EGD recommended since he is holding steady on Hgb and he had and EGD a few months ago and has bad lungs.  He was asking about pain medicine for his chronic pains and I wish him to get these from his primary doctor and i will be glad to be his GI doctor and can follow up in office after discharge.  Will advance diet and add mashed potatoes and scrambled eggs.  Then can go to mechanical soft diet tomorrow and maybe home Tuesday.

## 2015-04-12 NOTE — Progress Notes (Signed)
Dr. Leslye Peer notified of complaints of anxiety, not time for another xanax at this time. MD order for xanax 0.25mg  once, RN can give ordered PRN dose when due.

## 2015-04-12 NOTE — Progress Notes (Signed)
Patient complaining of care throughout shift.  Very non compliant with care and refuses to follow through with diabetic diet.  Only 1 stool this shift which was accidentally sent to lab for testing when it had already been tested yesterday.

## 2015-04-12 NOTE — Progress Notes (Signed)
Patient has elevated fsbs this shift with order for one time novolog  Injection. Patient needs stand by assit to void in urinal. Antianxiety and insomnia med given. Noncompliant with food . Wants to eat ice cream and other items from dietary that are not  Appropriate for FSBS in the 400's.

## 2015-04-12 NOTE — Progress Notes (Signed)
PT Cancellation Note  Patient Details Name: JOHNATHON OLDEN MRN: [id number] DOB: 1941/05/20   Cancelled Treatment:    Reason Eval/Treat Not Completed: Other (comment) (pt does not want to work with this PT today)  Pt reports that he has been in and out of the same hospital for the same thing roughly every 30 days for the last year.  He appears annoyed at the situation but begins to give his history stating that he always has a hospital PT come by and walk him down the hall, but apparently last visit he did not have PT.  He reports that he has been more limited at home since his last time in hospital.  When PT suggested that even though no one told him to do any exercises last time that he may have been familiar with other similar similar situations and did not need direct cuing to motivate staying active he becomes irritated, aggressively asserts that this PT is disrespectful, claimed that PT said he had a mental issue (unsure how this could reasonably been assumed?), etc, etc.  The situation devolved quickly and it became evident that nothing productive was going to come of this encounter.  PT had to exit room as the pt's discontent became more and more vocal. Assured pt that a different PT would try back again tomorrow. Kreg Shropshire 04/12/2015, 7:15 PM

## 2015-04-13 ENCOUNTER — Ambulatory Visit: Payer: Commercial Managed Care - HMO | Admitting: Pulmonary Disease

## 2015-04-13 LAB — CBC
HCT: 27.7 % — ABNORMAL LOW (ref 40.0–52.0)
Hemoglobin: 8.7 g/dL — ABNORMAL LOW (ref 13.0–18.0)
MCH: 26.9 pg (ref 26.0–34.0)
MCHC: 31.5 g/dL — AB (ref 32.0–36.0)
MCV: 85.3 fL (ref 80.0–100.0)
PLATELETS: 254 10*3/uL (ref 150–440)
RBC: 3.25 MIL/uL — ABNORMAL LOW (ref 4.40–5.90)
RDW: 17.1 % — ABNORMAL HIGH (ref 11.5–14.5)
WBC: 10 10*3/uL (ref 3.8–10.6)

## 2015-04-13 LAB — GLUCOSE, CAPILLARY
GLUCOSE-CAPILLARY: 261 mg/dL — AB (ref 65–99)
GLUCOSE-CAPILLARY: 296 mg/dL — AB (ref 65–99)
GLUCOSE-CAPILLARY: 429 mg/dL — AB (ref 65–99)
Glucose-Capillary: 367 mg/dL — ABNORMAL HIGH (ref 65–99)

## 2015-04-13 MED ORDER — INSULIN GLARGINE 100 UNIT/ML ~~LOC~~ SOLN
18.0000 [IU] | Freq: Every day | SUBCUTANEOUS | Status: DC
  Filled 2015-04-13 (×2): qty 0.18

## 2015-04-13 MED ORDER — INSULIN ASPART 100 UNIT/ML ~~LOC~~ SOLN
16.0000 [IU] | Freq: Once | SUBCUTANEOUS | Status: AC
  Administered 2015-04-13: 16 [IU] via SUBCUTANEOUS

## 2015-04-13 NOTE — Care Management (Signed)
Important Message  Patient Details  Name: DASHUN BORRE MRN: [id number] Date of Birth: 18-Sep-1941   Medicare Important Message Given:  Yes-second notification given    Juliann Pulse A Allmond 04/13/2015, 1:05 PM

## 2015-04-13 NOTE — Progress Notes (Signed)
Inpatient Diabetes Program Recommendations  AACE/ADA: New Consensus Statement on Inpatient Glycemic Control (2013)  Target Ranges:  Prepandial:   less than 140 mg/dL      Peak postprandial:   less than 180 mg/dL (1-2 hours)      Critically ill patients:  140 - 180 mg/dL    Results for [name], [name] (MRN [id number]) as of 04/13/2015 09:10  Ref. Range 04/12/2015 08:04 04/12/2015 11:40 04/12/2015 17:18 04/12/2015 21:21  Glucose-Capillary Latest Ref Range: 65-99 mg/dL 364 (H) 388 (H) 276 (H) 221 (H)    Results for [name], [name] (MRN [id number]) as of 04/13/2015 09:10  Ref. Range 04/13/2015 07:48  Glucose-Capillary Latest Ref Range: 65-99 mg/dL 261 (H)     Admit with: COPD  History: DM, COPD, CHF, CAD  Home DM Meds: Metformin 500 mg daily  Current DM Orders: Lantus 15 units daily            Novolog Moderate SSI (0-15 units) tid ac + HS            Novolog 5 units tidwc    **Note patient having hyperglycemia likely due to steroids.  Note that last dose of IV Solumedrol was given yesterday (06/26) at ~7AM.    **Patient now getting Prednisone 40 mg daily.  **Eating 100% of meals.  **Expect glucose levels to improve as IV steroids leave patient's system.    Will follow Wyn Quaker RN, MSN, CDE Diabetes Coordinator Inpatient Glycemic Control Team Team Pager: [phone] (8a-5p)

## 2015-04-13 NOTE — Evaluation (Signed)
Physical Therapy Evaluation Patient Details Name: ESMERALDA BLANFORD MRN: [id number] DOB: January 16, 1941 Today's Date: 04/13/2015   History of Present Illness  75 yo male with onset of GI bleed and CHF with leukocytosis, respiratory failure is readmitted with chronic issues.  PMHx:  AAA, CHF, CAD, DM, GO bleed  Clinical Impression  Pt was able to work with PT today and noted pulses up to 111 with walking effort, but did have controlled O2 sats with effort.  Pt does have substantial health issues and is in HD today.  Bedside sitting for meals and trying to increase activity, and will recommend inpt therapy to increase his total activity and efforts.    Follow Up Recommendations SNF    Equipment Recommendations  None recommended by PT    Recommendations for Other Services       Precautions / Restrictions Precautions Precautions: Fall Restrictions Weight Bearing Restrictions: No      Mobility  Bed Mobility                  Transfers Overall transfer level: Needs assistance Equipment used: Rolling walker (2 wheeled);1 person hand held assist Transfers: Sit to/from Stand;Stand Pivot Transfers Sit to Stand: Min assist Stand pivot transfers: Min guard;Min assist       General transfer comment: reminders for hand placment and controlling descent  Ambulation/Gait Ambulation/Gait assistance: Min assist;Min guard Ambulation Distance (Feet): 100 Feet Assistive device: Rolling walker (2 wheeled);1 person hand held assist Gait Pattern/deviations: Step-through pattern;Wide base of support;Drifts right/left Gait velocity: reduced Gait velocity interpretation: Below normal speed for age/gender General Gait Details: limited tolerance for walk due to SOB and fatigue, has been in bed most of the last few days per pt, declined to sit up in chair  Stairs            Wheelchair Mobility    Modified Rankin (Stroke Patients Only)       Balance Overall balance assessment: Needs  assistance Sitting-balance support: Feet supported Sitting balance-Leahy Scale: Fair   Postural control: Posterior lean Standing balance support: Bilateral upper extremity supported Standing balance-Leahy Scale: Poor                               Pertinent Vitals/Pain Pain Assessment: No/denies pain    Home Living Family/patient expects to be discharged to:: Private residence Living Arrangements: Spouse/significant other Available Help at Discharge: Family Type of Home: House Home Access: Stairs to enter   Technical brewer of Steps: 2 Home Layout: One level Home Equipment: Environmental consultant - 2 wheels;Shower seat      Prior Function Level of Independence: Independent with assistive device(s)         Comments: states he doesn't have as much help as needed     Hand Dominance        Extremity/Trunk Assessment   Upper Extremity Assessment: Overall WFL for tasks assessed           Lower Extremity Assessment: Generalized weakness      Cervical / Trunk Assessment: Normal  Communication   Communication: HOH;Receptive difficulties;Other (comment) (easily agitated)  Cognition Arousal/Alertness: Awake/alert Behavior During Therapy: Agitated Overall Cognitive Status: No family/caregiver present to determine baseline cognitive functioning                      General Comments General comments (skin integrity, edema, etc.): Pt has not been up in chair per his recount since this admission  and exprsses motivation to get up and walk. He is planning to go home wiith wife and will need rehab to follow up for his low endurance and safety issues.      Exercises        Assessment/Plan    PT Assessment Patient needs continued PT services  PT Diagnosis Difficulty walking;Generalized weakness   PT Problem List Decreased strength;Decreased range of motion;Decreased activity tolerance;Decreased balance;Decreased mobility;Decreased coordination;Decreased  knowledge of use of DME;Cardiopulmonary status limiting activity;Obesity  PT Treatment Interventions DME instruction;Gait training;Stair training;Functional mobility training;Therapeutic activities;Therapeutic exercise;Balance training;Neuromuscular re-education;Patient/family education   PT Goals (Current goals can be found in the Care Plan section) Acute Rehab PT Goals Patient Stated Goal: to get walking again PT Goal Formulation: With patient Time For Goal Achievement: 04/27/15 Potential to Achieve Goals: Good    Frequency Min 2X/week   Barriers to discharge Inaccessible home environment;Decreased caregiver support wife is not available per pt    Co-evaluation               End of Session Equipment Utilized During Treatment: Gait belt Activity Tolerance: Patient tolerated treatment well;Patient limited by fatigue Patient left: in bed;with call bell/phone within reach Nurse Communication: Mobility status         Time: 1210-1242 PT Time Calculation (min) (ACUTE ONLY): 32 min   Charges:   PT Evaluation $Initial PT Evaluation Tier I: 1 Procedure PT Treatments $Gait Training: 8-22 mins   PT G CodesRamond Dial 05/07/15, 5:44 PM   [name], PT MS Acute Rehab Dept. Number: [phone] [phone] and [place] [phone]

## 2015-04-13 NOTE — Progress Notes (Addendum)
Patient had a episode of coughing spell, started wheezing, Respiratory called for prn treatment.  RT here to give treatment.

## 2015-04-13 NOTE — Progress Notes (Signed)
03/10/15 1500  PT Visit Information  Last PT Received On 03/10/15  Assistance Needed +1  History of Present Illness 74 yo male with history of bloody stools and diarrhea was admitted with findings of elevated troponin and low hgb, Ca+.  Pt is chronic lung disease pt, memory loss, on home O2.  Precautions  Precautions Fall  Precaution Comments needs to be ckd for elevated pulses and O2 sats  Restrictions  Weight Bearing Restrictions No  Home Living  Family/patient expects to be discharged to: Private residence  Living Arrangements Spouse/significant other  Available Help at Discharge Family  Type of [place] to enter  Prior Function  Level of Independence Independent  Comments states he doesn't have as much help as needed  Communication  Communication HOH;Receptive difficulties  Pain Assessment  Pain Assessment No/denies pain  Cognition  Arousal/Alertness Awake/alert  Behavior During Therapy Agitated;Anxious;Impulsive  Overall Cognitive Status History of cognitive impairments - at baseline  Upper Extremity Assessment  Upper Extremity Assessment Overall WFL for tasks assessed  Lower Extremity Assessment  Lower Extremity Assessment Generalized weakness  Cervical / Trunk Assessment  Cervical / Trunk Assessment Normal  Bed Mobility  Overal bed mobility Needs Assistance  Bed Mobility Supine to Sit;Sit to Supine  Supine to sit Supervision;Min guard  Sit to supine Min guard;Supervision  Transfers  Overall transfer level Needs assistance  Transfers Sit to/from Stand;Stand Pivot Transfers  Sit to Stand Min guard;Min assist  Stand pivot transfers Min guard;Min assist  General transfer comment reminders for hand placement  Ambulation/Gait  Ambulation/Gait assistance Min guard  Ambulation Distance (Feet) 2 Feet  Assistive device 1 person hand held assist  Balance  Overall balance assessment Needs assistance  Postural control Posterior lean  Standing  balance support Bilateral upper extremity supported  Standing balance-Leahy Scale Fair  Standing balance comment fair- dynamic baalnce  General Comments  General comments (skin integrity, edema, etc.) Pt is having some difficulty with controlling anxiety and has controlled O2 sats and pulse, controlled BP with activity but did not attempt gait to   Exercises  Exercises Other exercises (strength was 4- hip add but grossly WFL otherwise BLE's)  PT - End of Session  Equipment Utilized During Treatment Oxygen  Activity Tolerance Patient tolerated treatment well;Patient limited by fatigue  Patient left in bed;with call bell/phone within reach  Nurse Communication Mobility status  PT Assessment  PT Therapy Diagnosis  Generalized weakness  PT Recommendation/Assessment Patient needs continued PT services  PT Problem List Decreased strength;Decreased range of motion;Decreased activity tolerance;Decreased balance;Decreased mobility;Decreased coordination;Decreased cognition;Decreased knowledge of use of DME;Decreased safety awareness;Decreased knowledge of precautions;Cardiopulmonary status limiting activity;Obesity  Barriers to Discharge Inaccessible home environment;Decreased caregiver support  PT Plan  PT Frequency (ACUTE ONLY) Min 2X/week  PT Treatment/Interventions (ACUTE ONLY) DME instruction;Gait training;Stair training;Functional mobility training;Therapeutic activities;Therapeutic exercise;Balance training;Neuromuscular re-education;Cognitive remediation;Patient/family education  PT Recommendation  Follow Up Recommendations SNF  PT equipment Rolling walker with 5" wheels  Individuals Consulted  Consulted and Agree with Results and Recommendations Patient  Acute Rehab PT Goals  Patient Stated Goal to go home with help  PT Goal Formulation With patient  Time For Goal Achievement 03/24/15  Potential to Achieve Goals Good  PT Time Calculation  PT Start Time (ACUTE ONLY) 1300  PT Stop Time  (ACUTE ONLY) 1355  PT Time Calculation (min) (ACUTE ONLY) 55 min  PT G-Codes **NOT FOR INPATIENT CLASS**  Functional Assessment Tool Used clinical judgment  Functional Limitation Mobility: Walking and  moving around  Mobility: Walking and Moving Around Current Status [phone]) CK  Mobility: Walking and Moving Around Goal Status (Z7471) CK  PT General Charges  $$ ACUTE PT VISIT 1 Procedure  PT Evaluation  $Initial PT Evaluation Tier I 1 Procedure  PT Treatments  $Therapeutic Exercise 8-22 mins  $Therapeutic Activity 23-37 mins   Late entry for missed G-code. Based on review of the evaluation and goals by [name], PT. Lady Deutscher PT, DPT 12:15 PM 04/13/2015

## 2015-04-13 NOTE — Progress Notes (Signed)
03/10/15 1500  PT Visit Information  Last PT Received On 03/10/15  Assistance Needed +1  History of Present Illness 74 yo male with history of bloody stools and diarrhea was admitted with findings of elevated troponin and low hgb, Ca+.  Pt is chronic lung disease pt, memory loss, on home O2.  Precautions  Precautions Fall  Precaution Comments needs to be ckd for elevated pulses and O2 sats  Restrictions  Weight Bearing Restrictions No  Home Living  Family/patient expects to be discharged to: Private residence  Living Arrangements Spouse/significant other  Available Help at Discharge Family  Type of [place] to enter  Prior Function  Level of Independence Independent  Comments states he doesn't have as much help as needed  Communication  Communication HOH;Receptive difficulties  Pain Assessment  Pain Assessment No/denies pain  Cognition  Arousal/Alertness Awake/alert  Behavior During Therapy Agitated;Anxious;Impulsive  Overall Cognitive Status History of cognitive impairments - at baseline  Upper Extremity Assessment  Upper Extremity Assessment Overall WFL for tasks assessed  Lower Extremity Assessment  Lower Extremity Assessment Generalized weakness  Cervical / Trunk Assessment  Cervical / Trunk Assessment Normal  Bed Mobility  Overal bed mobility Needs Assistance  Bed Mobility Supine to Sit;Sit to Supine  Supine to sit Supervision;Min guard  Sit to supine Min guard;Supervision  Transfers  Overall transfer level Needs assistance  Transfers Sit to/from Stand;Stand Pivot Transfers  Sit to Stand Min guard;Min assist  Stand pivot transfers Min guard;Min assist  General transfer comment reminders for hand placement  Ambulation/Gait  Ambulation/Gait assistance Min guard  Ambulation Distance (Feet) 2 Feet  Assistive device 1 person hand held assist  Balance  Overall balance assessment Needs assistance  Postural control Posterior lean  Standing  balance support Bilateral upper extremity supported  Standing balance-Leahy Scale Fair  Standing balance comment fair- dynamic baalnce  General Comments  General comments (skin integrity, edema, etc.) Pt is having some difficulty with controlling anxiety and has controlled O2 sats and pulse, controlled BP with activity but did not attempt gait to   Exercises  Exercises Other exercises (strength was 4- hip add but grossly WFL otherwise BLE's)  PT - End of Session  Equipment Utilized During Treatment Oxygen  Activity Tolerance Patient tolerated treatment well;Patient limited by fatigue  Patient left in bed;with call bell/phone within reach  Nurse Communication Mobility status  PT Assessment  PT Therapy Diagnosis  Generalized weakness  PT Recommendation/Assessment Patient needs continued PT services  PT Problem List Decreased strength;Decreased range of motion;Decreased activity tolerance;Decreased balance;Decreased mobility;Decreased coordination;Decreased cognition;Decreased knowledge of use of DME;Decreased safety awareness;Decreased knowledge of precautions;Cardiopulmonary status limiting activity;Obesity  Barriers to Discharge Inaccessible home environment;Decreased caregiver support  PT Plan  PT Frequency (ACUTE ONLY) Min 2X/week  PT Treatment/Interventions (ACUTE ONLY) DME instruction;Gait training;Stair training;Functional mobility training;Therapeutic activities;Therapeutic exercise;Balance training;Neuromuscular re-education;Cognitive remediation;Patient/family education  PT Recommendation  Follow Up Recommendations SNF  PT equipment Rolling walker with 5" wheels  Individuals Consulted  Consulted and Agree with Results and Recommendations Patient  Acute Rehab PT Goals  Patient Stated Goal to go home with help  PT Goal Formulation With patient  Time For Goal Achievement 03/24/15  Potential to Achieve Goals Good  PT Time Calculation  PT Start Time (ACUTE ONLY) 1300  PT Stop Time  (ACUTE ONLY) 1355  PT Time Calculation (min) (ACUTE ONLY) 55 min  PT G-Codes **NOT FOR INPATIENT CLASS**  Functional Assessment Tool Used clinical judgment  Functional Limitation Mobility: Walking and  moving around  Mobility: Walking and Moving Around Current Status [phone]) CK  Mobility: Walking and Moving Around Goal Status (Q7619) CK  PT General Charges  $$ ACUTE PT VISIT 1 Procedure  PT Evaluation  $Initial PT Evaluation Tier I 1 Procedure  PT Treatments  $Therapeutic Exercise 8-22 mins  $Therapeutic Activity 23-37 mins   Late entry for missed G-code. Based on review of the evaluation and goals by [name], PT. Lady Deutscher PT, DPT 12:14 PM 04/13/2015

## 2015-04-13 NOTE — Consult Note (Signed)
GI Inpatient Follow-up Note  Patient Identification: [name] is a 74 y.o. male with anemia  Subjective: [name] reports that he is feeling much better today and was in good spirits during my visit.  He denies a BM today, denies abdominal pain and reports that he has been passing gas.  He reports he ate a good breakfast and was eating a cheeseburger, potatoes, pudding and ice cream while I was there.   Scheduled Inpatient Medications:  . atorvastatin  40 mg Oral QHS  . azithromycin  500 mg Oral Daily  . budesonide-formoterol  2 puff Inhalation BID  . citalopram  40 mg Oral Daily  . guaiFENesin  600 mg Oral BID   And  . dextromethorphan  30 mg Oral BID  . insulin aspart  0-15 Units Subcutaneous TID AC & HS  . insulin aspart  5 Units Subcutaneous TID WC  . [START ON 04/14/2015] insulin glargine  18 Units Subcutaneous Daily  . iron polysaccharides  150 mg Oral BID  . lisinopril  5 mg Oral Daily  . pantoprazole  40 mg Oral BID  . predniSONE  40 mg Oral Q breakfast  . sucralfate  1 g Oral TID WC & HS  . tiotropium  18 mcg Inhalation Daily    Continuous Inpatient Infusions:     PRN Inpatient Medications:  acetaminophen **OR** acetaminophen, albuterol, ALPRAZolam, alum & mag hydroxide-simeth, HYDROcodone-acetaminophen, zolpidem  Review of Systems: Constitutional: Weight is stable.  Eyes: No changes in vision. ENT: No oral lesions, sore throat.  GI: see HPI.  Heme/Lymph: No easy bruising.  CV: No chest pain.  GU: No hematuria.  Integumentary: No rashes.  Neuro: No headaches.  Psych: No depression/anxiety.  Endocrine: No heat/cold intolerance.  Allergic/Immunologic: No urticaria.  Resp: No cough, SOB.  Musculoskeletal: No joint swelling.    Physical Examination: BP 128/68 mmHg  Pulse 113  Temp(Src) 98 F (36.7 C) (Oral)  Resp 17  Ht 6\' 3"  (1.905 m)  Wt 117.391 kg (258 lb 12.8 oz)  BMI 32.35 kg/m2  SpO2 96% Gen: NAD, alert and oriented x 4 HEENT: PEERLA,  EOMI, Neck: supple, no JVD or thyromegaly Chest: CTA bilaterally, no wheezes, crackles, or other adventitious sounds CV: RRR, no m/g/c/r Abd: soft, NT, ND, +BS in all four quadrants; no HSM, guarding, ridigity, or rebound tenderness Ext: no edema, well perfused with 2+ pulses, Skin: no rash or lesions noted Lymph: no LAD  Data: Lab Results  Component Value Date   WBC 10.0 04/13/2015   HGB 8.7* 04/13/2015   HCT 27.7* 04/13/2015   MCV 85.3 04/13/2015   PLT 254 04/13/2015    Recent Labs Lab 04/11/15 0355 04/12/15 0428 04/13/15 0447  HGB 8.1* 8.1* 8.7*   Lab Results  Component Value Date   NA 134* 04/12/2015   K 4.3 04/12/2015   CL 98* 04/12/2015   CO2 26 04/12/2015   BUN 20 04/12/2015   CREATININE 1.01 04/12/2015   Lab Results  Component Value Date   ALT 11* 03/09/2015   AST 17 03/09/2015   ALKPHOS 92 03/09/2015   BILITOT 0.3 03/09/2015   No results for input(s): APTT, INR, PTT in the last 168 hours. Assessment/Plan: [name] is a 74 y.o. male with anemia  Recommendations: Continue with recommended course per Dr. [name] 04/12/2015.  We will continue to follow with you and plan on following up with him in our office. Please call with questions or concerns.  Salvadore Farber, PA-C  I  personally performed these services.

## 2015-04-13 NOTE — Progress Notes (Signed)
[name] NAME: [name]    MR#:  [id number]  DATE OF BIRTH:  [birth year]  SUBJECTIVE:  CHIEF COMPLAINT:   Chief Complaint  Patient presents with  . Cough  still cough, SOB and wheezing. No melena, tolerated diet.  REVIEW OF SYSTEMS:  CONSTITUTIONAL: No fever, has generalized weakness.  EYES: No blurred or double vision.  EARS, NOSE, AND THROAT: No tinnitus or ear pain.  RESPIRATORY: Positive cough, sputum, shortness of breath and wheezing but no hemoptysis.  CARDIOVASCULAR: No chest pain, orthopnea, edema.  GASTROINTESTINAL: No nausea, vomiting, diarrhea or abdominal pain.  GENITOURINARY: No dysuria, hematuria.  ENDOCRINE: No polyuria, nocturia,  HEMATOLOGY: No anemia, easy bruising or bleeding SKIN: No rash or lesion. MUSCULOSKELETAL: No joint pain or arthritis.   NEUROLOGIC: No tingling, numbness, weakness.  PSYCHIATRY: No anxiety or depression.   DRUG ALLERGIES:   Allergies  Allergen Reactions  . Penicillins Anaphylaxis and Hives  . Demerol [Meperidine] Other (See Comments)    hallucinations  . Dilaudid [Hydromorphone Hcl] Other (See Comments)    hallucinations  . Levaquin [Levofloxacin In D5w]     Nausea and diarrhea  . Morphine And Related Nausea Only and Other (See Comments)    hallucinations    VITALS:  Blood pressure 153/89, pulse 101, temperature 97.5 F (36.4 C), temperature source Oral, resp. rate 18, height 6\' 3"  (1.905 m), weight 117.391 kg (258 lb 12.8 oz), SpO2 100 %.  PHYSICAL EXAMINATION:  GENERAL:  74 y.o.-year-old patient lying in the bed with no acute distress.  EYES: Pupils equal, round, reactive to light and accommodation. No scleral icterus. Extraocular muscles intact.  HEENT: Head atraumatic, normocephalic. Oropharynx and nasopharynx clear.  NECK:  Supple, no jugular venous distention. No thyroid enlargement, no tenderness.  LUNGS: Normal breath sounds bilaterally, moderate  b/l expiratory wheezing and rhonchi. No use of accessory muscles of respiration.  CARDIOVASCULAR: S1, S2 normal. No murmurs, rubs, or gallops.  ABDOMEN: Soft, nontender, nondistended. Bowel sounds present. No organomegaly or mass.  EXTREMITIES: No pedal edema, cyanosis, or clubbing.  NEUROLOGIC: Cranial nerves II through XII are intact. Muscle strength 4/5 in all extremities. Sensation intact. Gait not checked.  PSYCHIATRIC: The patient is alert and oriented x 3.  SKIN: No obvious rash, lesion, or ulcer.    LABORATORY PANEL:   CBC  Recent Labs Lab 04/13/15 0447  WBC 10.0  HGB 8.7*  HCT 27.7*  PLT 254   ------------------------------------------------------------------------------------------------------------------  Chemistries   Recent Labs Lab 04/12/15 0428  NA 134*  K 4.3  CL 98*  CO2 26  GLUCOSE 311*  BUN 20  CREATININE 1.01  CALCIUM 8.5*   ------------------------------------------------------------------------------------------------------------------  Cardiac Enzymes  Recent Labs Lab 04/10/15 0600  TROPONINI <0.03   ------------------------------------------------------------------------------------------------------------------  RADIOLOGY:  No results found.  EKG:   Orders placed or performed during the hospital encounter of 04/10/15  . EKG 12-Lead  . EKG 12-Lead  . EKG 12-Lead  . EKG 12-Lead    ASSESSMENT AND PLAN:   COPD exacerbation. Chronic respiratory failure. continue prednisone,zithromax, DuoNeb, Symbicort and Spiriva. Continue oxygen [place] 3L.  GIB with Symptomatic anemia.  Possible due to e gastric erosion  per Dr. Tiffany Kocher.. Stable. S/p 2 unit PRBC transfusion.  advance to mechanical soft diet today, continue Protonix and carafate per Dr. Tiffany Kocher.  Leukocytosis. Due to steroid. Improved.  DM with hyperglycemia.  increase Lantus to 18 with a moderate sliding scale. hold metformin.  Chronic diastolic CHF. Stable.  Hypertension.  Lisinopril.  All the records are reviewed and case discussed with Care Management/Social Workerr. Management plans discussed with the patient, family and they are in agreement.  CODE STATUS: full code.  TOTAL TIME TAKING CARE OF THIS PATIENT: 38   minutes.   POSSIBLE D/C IN 2 DAYS, DEPENDING ON CLINICAL CONDITION.   Demetrios Loll M.D on 04/13/2015 at 1:49 PM  Between 7am to 6pm - Pager - [phone]  After 6pm go to [email or web address] - password [email or web address] [facility]  Office  [phone]  CC: Primary care physician; Dustin Flock, MD

## 2015-04-13 NOTE — Progress Notes (Signed)
[facility]  Patient Status: Active (receiving services up to time of hospitalization)  [facility] is providing the following services: RN and PT  If patient discharges after hours, please call [phone].   [name] 04/13/2015, 1:30 PM

## 2015-04-13 NOTE — Progress Notes (Signed)
Dr Lianne Moris H and P said that pt is to be a COPD Gold pt but I do not see any COPD Gold orders.  I did talk with Dr Bridgett Larsson about this and he said he would review it.  COPD packet and gold card given to the pt.  Education done with all the materials in the COPD packet

## 2015-04-13 NOTE — Plan of Care (Signed)
Problem: Phase III Progression Outcomes Goal: Activity at appropriate level-compared to baseline (UP IN CHAIR FOR HEMODIALYSIS)  Outcome: Progressing Sits up side of the bed

## 2015-04-13 NOTE — Progress Notes (Signed)
Dr.Chen here to see patient- assessing patient and discussing plan of care.

## 2015-04-13 NOTE — Progress Notes (Addendum)
Pt's blood sugar checked- 429.  Dr.Chen paged. Call back received from MD, updated with patients blood sugar of 429, order received to give 16 units x1 for sliding scale coverage.

## 2015-04-14 ENCOUNTER — Other Ambulatory Visit: Payer: Self-pay | Admitting: Family Medicine

## 2015-04-14 DIAGNOSIS — F332 Major depressive disorder, recurrent severe without psychotic features: Secondary | ICD-10-CM

## 2015-04-14 LAB — GLUCOSE, CAPILLARY
GLUCOSE-CAPILLARY: 361 mg/dL — AB (ref 65–99)
Glucose-Capillary: 224 mg/dL — ABNORMAL HIGH (ref 65–99)
Glucose-Capillary: 356 mg/dL — ABNORMAL HIGH (ref 65–99)
Glucose-Capillary: 203 mg/dL — ABNORMAL HIGH (ref 65–99)

## 2015-04-14 MED ORDER — INSULIN ASPART 100 UNIT/ML ~~LOC~~ SOLN
8.0000 [IU] | Freq: Three times a day (TID) | SUBCUTANEOUS | Status: DC
  Administered 2015-04-14 – 2015-04-17 (×11): 8 [IU] via SUBCUTANEOUS
  Filled 2015-04-14 (×10): qty 8

## 2015-04-14 MED ORDER — ALPRAZOLAM 0.5 MG PO TABS
0.5000 mg | ORAL_TABLET | Freq: Once | ORAL | Status: AC
  Administered 2015-04-14: 0.5 mg via ORAL
  Filled 2015-04-14: qty 1

## 2015-04-14 MED ORDER — INSULIN GLARGINE 100 UNIT/ML ~~LOC~~ SOLN
20.0000 [IU] | Freq: Every day | SUBCUTANEOUS | Status: DC
  Administered 2015-04-14 – 2015-04-17 (×4): 20 [IU] via SUBCUTANEOUS
  Filled 2015-04-14 (×5): qty 0.2

## 2015-04-14 NOTE — Progress Notes (Signed)
Spouse at the bedside, [name] here from pastoral care. Patient is okay with spouse visiting. Sitter-Courtney at the bedside. Patient requesting no medical information to be given to spouse.

## 2015-04-14 NOTE — Progress Notes (Addendum)
Patient very upset after talking with his wife. Given xanax per order (see MAR)- with no relief. MD paged -Dr.Chen. Waiting for callback. Callback received- updated with patient's anxiety. No new order given.

## 2015-04-14 NOTE — Clinical Social Work Note (Signed)
Clinical Social Work Assessment  Patient Details  Name: [name] MRN: [id number] Date of Birth: [birth year]  Date of referral:  04/14/15               Reason for consult:  Facility Placement                Permission sought to share information with:    Permission granted to share information::  No  Name::        Agency::     Relationship::     Contact Information:     Housing/Transportation Living arrangements for the past 2 months:  Single Family Home Source of Information:  Patient Patient Interpreter Needed:  None Criminal Activity/Legal Involvement Pertinent to Current Situation/Hospitalization:  No - Comment as needed Significant Relationships:  Spouse Lives with:  Spouse Do you feel safe going back to the place where you live?  Yes Need for family participation in patient care:  No (Coment)  Care giving concerns:  Patient lives with his wife in [place].    Social Worker assessment / plan:  Holiday representative (CSW) met with patient to address SNF consult. PT was is now recommending home health. Patient walked around the nurses station this afternoon. CSW introduced self and explained role of CSW department. Patient reported that he wanted to go to rehab to get away from his wife. CSW explained that patient's insurance [facility] will not pay for rehab because patient is not meeting criteria for rehab. Patient verbalized his understanding and reported that he wanted to go home with [place]. Patient reported that he wanted to continue to see [facility]. Patient was agitated and reported that he is mad at his wife. CSW provided emotional support. Patient reported that his wife "is forgetful and does not care about him being sick." Patient reported that his wife changed their TV network over to [employer] while he has been at the hospital. Patient reported that his wife is "easily fooled by sales people." Patient reported that he wants to  divorce his wife after 70 years of marriage. Patient reported they have no children together. Patient's wife has children from another marriage. Patient reported that his wife only cares about her children and her horses. Patient reported that he is "5th on his wife's list." CSW provided emotional support. Patient became more angry and agitated while discussing his wife.   CSW made RN aware of above and requested a psych consult. CSW will continue to follow and assist as needed.   Employment status:  Retired Nurse, adult PT Recommendations:  Home with [place] / Referral to community resources:     Patient/Family's Response to care:  Patient is agreeable to home health.   Patient/Family's Understanding of and Emotional Response to Diagnosis, Current Treatment, and Prognosis:  Patient was angry, agitated, and argumentative throughout assessment. Patient's anger continued to build as he was discussing his wife.   Emotional Assessment Appearance:  Appears older than stated age Attitude/Demeanor/Rapport:  Angry, Complaining Affect (typically observed):  Agitated, Angry Orientation:  Oriented to Self, Oriented to Place, Oriented to  Time, Oriented to Situation Alcohol / Substance use:  Not Applicable Psych involvement (Current and /or in the community):  Yes (Comment)  Discharge Needs  Concerns to be addressed:  Mental Health Concerns Readmission within the last 30 days:  No Current discharge risk:  Other, Chronically ill, Abuse Barriers to Discharge:  Continued Medical  Work up   Loralyn Freshwater, LCSW 04/14/2015, 3:19 PM

## 2015-04-14 NOTE — Progress Notes (Signed)
Physical Therapy Treatment Patient Details Name: [name] MRN: [id number] DOB: [birth year] Today's Date: 04/14/2015    History of Present Illness 74 yo male with onset of GI bleed and CHF with leukocytosis, respiratory failure is readmitted with chronic issues.  PMHx:  AAA, CHF, CAD, DM, GO bleed    PT Comments    Pt very angry/rude throughout session. Pt with inappropriate answers to simple questions throughout session all leading into discussion from him on how "pissed off" he is with his wife and what he would like to do to kill her and requests help from anyone/thing that could help him to do so. Pt refusing O2 for ambulation initially so that he could die faster. Pt notes if therapist places O2 on him, he will hold his breathe to pass out and die. Pt does ambulate with O2 on and refers back to the anger toward his spouse. Post ambulation, pt refuses up in chair and sits edge of bed. Pt does not wish to lie down. Pt offered tray table with necessities on it; pt refuses abruptly noting he wants his things on the other side when he does lie down and for therapist to leave it where it is currently. Alarm placed on bed. Spoke with nursing regarding behavior and threats as well as position pt is left in. Also spoke with social worker regarding behavior, mobility, and updating discharge plans. Also spoke with both regarding Chaplain trying to speak with pt. Social worker is going to speak with nursing/MD about and psych consult as well.   Follow Up Recommendations  Home health PT     Equipment Recommendations  None recommended by PT    Recommendations for Other Services       Precautions / Restrictions Precautions Precautions: Fall Restrictions Weight Bearing Restrictions: No    Mobility  Bed Mobility Overal bed mobility: Modified Independent             General bed mobility comments: Rolls/sits with ease using rails  Transfers Overall transfer level: Modified  independent Equipment used: Rolling walker (2 wheeled);1 person hand held assist Transfers: Sit to/from Stand Sit to Stand: Supervision            Ambulation/Gait Ambulation/Gait assistance: Supervision Ambulation Distance (Feet): 210 Feet Assistive device: Rolling walker (2 wheeled);1 person hand held assist Gait Pattern/deviations: Step-through pattern;Trunk flexed   Gait velocity interpretation: at or above normal speed for age/gender General Gait Details:  (Some SOB audible; O2 sats on 2L 92%, HR 132 post ambulation)   Stairs            Wheelchair Mobility    Modified Rankin (Stroke Patients Only)       Balance Overall balance assessment: No apparent balance deficits (not formally assessed)                                  Cognition Arousal/Alertness: Awake/alert Behavior During Therapy: Agitated (Angry, beligerent, violent talk of killing spouse/self) Overall Cognitive Status: Difficult to assess                      Exercises      General Comments        Pertinent Vitals/Pain Pain Assessment:  (Does not answer directly. States he has "pissed off" pain) Pain Location:  (In hips with ambulation; does not quantify) Pain Descriptors / Indicators: Aching;Sharp (B hips, but notes more pain due to anger)  Home Living                      Prior Function            PT Goals (current goals can now be found in the care plan section) Progress towards PT goals: Progressing toward goals    Frequency  Min 2X/week    PT Plan Discharge plan needs to be updated    Co-evaluation             End of Session Equipment Utilized During Treatment: Oxygen Activity Tolerance:  (SOB; self limiting) Patient left: in bed (Pt wished to sit edge of bed; refused tray with necessitie)     Time: 2831-5176 PT Time Calculation (min) (ACUTE ONLY): 26 min  Charges:  $Gait Training: 8-22 mins $Therapeutic Activity: 8-22 mins                     G Codes:      [name] 04/14/2015, 12:14 PM

## 2015-04-14 NOTE — Progress Notes (Signed)
Dr.Clapacs here to see patient.

## 2015-04-14 NOTE — Progress Notes (Signed)
Patient on suicide precautions- sitter at the bedside, patient still reports hurting self, provided emotional support,   IV site intact - right hand, Chronic oxygen- at 3L per [place] for COPD.   Seen by chaplain today, pending psych consult.

## 2015-04-14 NOTE — Progress Notes (Signed)
   04/14/15 1630  Clinical Encounter Type  Visited With Patient and family together;Health care provider  Visit Type Initial  Referral From Nurse  Consult/Referral To Chaplain  Spiritual Encounters  Spiritual Needs Emotional;Other (Comment)  Stress Factors  Patient Stress Factors Health changes  Family Stress Factors Exhausted;Health changes  Chaplain went to see patient and spouse. We discussed many things and tried to offer a compassionate presence. Will visit again tomorrow.

## 2015-04-14 NOTE — Progress Notes (Signed)
Patient's still very upset post talking with spouse, states " I want to die". Dr.Chen paged, waiting for callback from MD.

## 2015-04-14 NOTE — Consult Note (Signed)
Given patient comment to nurse about suicide when he goes home he may be considered for involuntary commitment.No new GI recommendations

## 2015-04-14 NOTE — Progress Notes (Signed)
[place] [facility] [place] NAME: [name]    MR#:  [id number]  DATE OF BIRTH:  1941-08-27  SUBJECTIVE:  CHIEF COMPLAINT:   Chief Complaint  Patient presents with  . Cough  better cough, SOB and wheezing. No melena, tolerated diet. He is very upset and the depressed after calling and had a conversation with his wife. He claimed suicidal ideation.  REVIEW OF SYSTEMS:  CONSTITUTIONAL: No fever, has generalized weakness.  EYES: No blurred or double vision.  EARS, NOSE, AND THROAT: No tinnitus or ear pain.  RESPIRATORY: Positive cough, sputum, shortness of breath and wheezing but no hemoptysis.  CARDIOVASCULAR: No chest pain, orthopnea, edema.  GASTROINTESTINAL: No nausea, vomiting, diarrhea or abdominal pain.  GENITOURINARY: No dysuria, hematuria.  ENDOCRINE: No polyuria, nocturia,  HEMATOLOGY: No anemia, easy bruising or bleeding SKIN: No rash or lesion. MUSCULOSKELETAL: No joint pain or arthritis.   NEUROLOGIC: No tingling, numbness, weakness.  PSYCHIATRY: No anxiety or depression.   DRUG ALLERGIES:   Allergies  Allergen Reactions  . Penicillins Anaphylaxis and Hives  . Demerol [Meperidine] Other (See Comments)    hallucinations  . Dilaudid [Hydromorphone Hcl] Other (See Comments)    hallucinations  . Levaquin [Levofloxacin In D5w]     Nausea and diarrhea  . Morphine And Related Nausea Only and Other (See Comments)    hallucinations    VITALS:  Blood pressure 149/87, pulse 117, temperature 97.5 F (36.4 C), temperature source Oral, resp. rate 18, height 6\' 3"  (1.905 m), weight 117.391 kg (258 lb 12.8 oz), SpO2 97 %.  PHYSICAL EXAMINATION:  GENERAL:  74 y.o.-year-old patient lying in the bed with no acute distress.  EYES: Pupils equal, round, reactive to light and accommodation. No scleral icterus. Extraocular muscles intact.  HEENT: Head atraumatic, normocephalic. Oropharynx and nasopharynx clear.  NECK:  Supple, no  jugular venous distention. No thyroid enlargement, no tenderness.  LUNGS: Normal breath sounds bilaterally, mild to moderate b/l expiratory wheezing and rhonchi. No use of accessory muscles of respiration.  CARDIOVASCULAR: S1, S2 normal. No murmurs, rubs, or gallops.  ABDOMEN: Soft, nontender, nondistended. Bowel sounds present. No organomegaly or mass.  EXTREMITIES: No pedal edema, cyanosis, or clubbing.  NEUROLOGIC: Cranial nerves II through XII are intact. Muscle strength 4/5 in all extremities. Sensation intact. Gait not checked.  PSYCHIATRIC: The patient is alert and oriented x 3. Depressed and suicidal ideation. SKIN: No obvious rash, lesion, or ulcer.    LABORATORY PANEL:   CBC  Recent Labs Lab 04/13/15 0447  WBC 10.0  HGB 8.7*  HCT 27.7*  PLT 254   ------------------------------------------------------------------------------------------------------------------  Chemistries   Recent Labs Lab 04/12/15 0428  NA 134*  K 4.3  CL 98*  CO2 26  GLUCOSE 311*  BUN 20  CREATININE 1.01  CALCIUM 8.5*   ------------------------------------------------------------------------------------------------------------------  Cardiac Enzymes  Recent Labs Lab 04/10/15 0600  TROPONINI <0.03   ------------------------------------------------------------------------------------------------------------------  RADIOLOGY:  No results found.  EKG:   Orders placed or performed during the hospital encounter of 04/10/15  . EKG 12-Lead  . EKG 12-Lead  . EKG 12-Lead  . EKG 12-Lead    ASSESSMENT AND PLAN:   COPD exacerbation. Chronic respiratory failure. continue prednisone,zithromax, DuoNeb, Symbicort and Spiriva. Continue oxygen [place] 3L.  GIB with Symptomatic anemia.  Possible due to e gastric erosion  per Dr. Tiffany Kocher.. Stable. S/p 2 unit PRBC transfusion.  advanced to mechanical soft diet today, continue Protonix and carafate per Dr. Tiffany Kocher.  Leukocytosis.  Due to steroid.  Improved.  DM with hyperglycemia.  increase Lantus to 20 with novolog 8 units tid AC, and moderate sliding scale. hold metformin.  Chronic diastolic CHF. Stable.  Hypertension. Lisinopril.  Depression and suicidal ideation. Psych consult in the sitter.   All the records are reviewed and case discussed with Care Management/Social Workerr. Management plans discussed with the patient, family and they are in agreement.  CODE STATUS: full code.  TOTAL TIME TAKING CARE OF THIS PATIENT: 46   minutes.   POSSIBLE D/C IN 2 DAYS, DEPENDING ON CLINICAL CONDITION.   Demetrios Loll M.D on 04/14/2015 at 1:39 PM  Between 7am to 6pm - Pager - [phone]  After 6pm go to [email or web address] - password [email or web address] [facility]  Office  [phone]  CC: Primary care physician; Dustin Flock, MD

## 2015-04-14 NOTE — Progress Notes (Signed)
Sitter at the bedside, patient on suicide watch- post verbalized of suicidal thought expressed to numerous staff member today. Psychiatrist consult placed.

## 2015-04-14 NOTE — Progress Notes (Signed)
Pt verbalized to me that when he goes home he plans to kill himself with his various prescriptions for narcotics he has at home.  He stated if that if that didn't work he would just drink anti-freeze which he has at home too.  Dr. Bridgett Larsson was called and notified.  Orders received.
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
## 2015-04-15 LAB — GLUCOSE, CAPILLARY
GLUCOSE-CAPILLARY: 274 mg/dL — AB (ref 65–99)
Glucose-Capillary: 192 mg/dL — ABNORMAL HIGH (ref 65–99)
Glucose-Capillary: 230 mg/dL — ABNORMAL HIGH (ref 65–99)
Glucose-Capillary: 127 mg/dL — ABNORMAL HIGH (ref 65–99)

## 2015-04-15 MED ORDER — PREDNISONE 20 MG PO TABS
20.0000 mg | ORAL_TABLET | Freq: Every day | ORAL | Status: DC
  Administered 2015-04-16: 20 mg via ORAL
  Filled 2015-04-15: qty 1

## 2015-04-15 NOTE — Progress Notes (Signed)
Patient ID: [name], male   DOB: 23-Jul-1941, 74 y.o.   MRN: [id number] [facility] PROGRESS NOTE  PCP: Dustin Flock, MD  HPI/Subjective: Patient states that he feels well and wants to get out of this "[facility]". He made some statements to get some attention. He states that he is not suicidal. All he wants to do is get out of this hospital. Now he is upset with having to eat with plastic utensils and now he doesn't want to eat. Patient is refusing to wear his oxygen. I did speak with the wife on the phone. She is trying her best to take as good of care of him as possible. He has acted like this at home also.  Objective: Filed Vitals:   04/15/15 1154  BP:   Pulse: 110  Temp:   Resp:     Intake/Output Summary (Last 24 hours) at 04/15/15 1157 Last data filed at 04/15/15 1123  Gross per 24 hour  Intake    240 ml  Output 3470.3 ml  Net -3230.3 ml   Filed Weights   04/10/15 0524 04/10/15 0913 04/10/15 0932  Weight: 118.842 kg (262 lb) 117.346 kg (258 lb 11.2 oz) 117.391 kg (258 lb 12.8 oz)    ROS: Review of Systems  Constitutional: Negative for fever and chills.  Eyes: Negative for blurred vision.  Respiratory: Negative for cough and shortness of breath.   Cardiovascular: Negative for chest pain.  Gastrointestinal: Negative for nausea, vomiting, abdominal pain, diarrhea and constipation.  Genitourinary: Negative for dysuria.  Musculoskeletal: Negative for joint pain.  Neurological: Negative for dizziness and headaches.   Exam: Physical Exam  HENT:  Nose: No mucosal edema.  Mouth/Throat: No oropharyngeal exudate or posterior oropharyngeal edema.  Eyes: Conjunctivae, EOM and lids are normal. Pupils are equal, round, and reactive to light.  Neck: No JVD present. Carotid bruit is not present. No edema present. No thyroid mass and no thyromegaly present.  Cardiovascular: S1 normal and S2 normal.  Exam reveals no gallop.   No murmur heard. Pulses:  Dorsalis pedis pulses are 2+ on the right side, and 2+ on the left side.  Respiratory: No respiratory distress. He has no wheezes. He has no rhonchi. He has no rales.  GI: Soft. Bowel sounds are normal. There is no tenderness.  Musculoskeletal:       Right ankle: He exhibits swelling.       Left ankle: He exhibits swelling.  Lymphadenopathy:    He has no cervical adenopathy.  Neurological: He is alert. No cranial nerve deficit.  Skin: Skin is warm. No rash noted. Nails show no clubbing.  Psychiatric: He has a normal mood and affect.    Data Reviewed: Basic Metabolic Panel:  Recent Labs Lab 04/10/15 0600 04/11/15 0355 04/12/15 0428  NA 136 135 134*  K 4.5 4.2 4.3  CL 100* 99* 98*  CO2 30 25 26   GLUCOSE 214* 374* 311*  BUN 21* 19 20  CREATININE 0.90 1.06 1.01  CALCIUM 8.1* 8.4* 8.5*   CBC:  Recent Labs Lab 04/10/15 0600 04/11/15 0355 04/12/15 0428 04/13/15 0447  WBC 12.3* 11.5* 13.4* 10.0  NEUTROABS 9.1*  --   --   --   HGB 6.7* 8.1* 8.1* 8.7*  HCT 23.1* 26.5* 26.2* 27.7*  MCV 86.9 85.6 84.8 85.3  PLT 337 285 293 254    CBG:  Recent Labs Lab 04/14/15 1140 04/14/15 1610 04/14/15 2034 04/15/15 0759 04/15/15 1102  GLUCAP 356* 361* 203* 192*  230*    Scheduled Meds: . atorvastatin  40 mg Oral QHS  . azithromycin  500 mg Oral Daily  . budesonide-formoterol  2 puff Inhalation BID  . citalopram  40 mg Oral Daily  . guaiFENesin  600 mg Oral BID   And  . dextromethorphan  30 mg Oral BID  . insulin aspart  0-15 Units Subcutaneous TID AC & HS  . insulin aspart  8 Units Subcutaneous TID WC  . insulin glargine  20 Units Subcutaneous Daily  . iron polysaccharides  150 mg Oral BID  . lisinopril  5 mg Oral Daily  . pantoprazole  40 mg Oral BID  . predniSONE  40 mg Oral Q breakfast  . sucralfate  1 g Oral TID WC & HS  . tiotropium  18 mcg Inhalation Daily    Assessment/Plan:  1. COPD exacerbation, chronic respiratory failure. Patient now refusing to wear his  oxygen. So for pulse ox 91% at rest. I will taper down prednisone as quickly as possible since lungs are sounding better. I'm unable to make a disposition at this time secondary to involuntarily committed. 2. Suicidal ideation. Patient involuntarily committed by psychiatry. Social worker having trouble on [employer]. Need to be cleared by psychiatry prior to any disposition. Patient now refusing to wear his oxygen, eat or take meds. 3. Hyperlipidemia unspecified continue atorvastatin. 4. Essential hypertension- continue usual medications. 5. Anemia- to follow-up with GI as outpatient. On Protonix and Carafate. 6. Diabetes type 2- sugars will be high on steroids. Continue glargine insulin and aspart insulin.  Code Status:     Code Status Orders        Start     Ordered   04/10/15 0912  Full code   Continuous     04/10/15 0912    Advance Directive Documentation        Most Recent Value   Type of Advance Directive  Healthcare Power of Attorney   Pre-existing out of facility DNR order (yellow form or pink MOST form)     "MOST" Form in Place?       Family Communication: Spoke with wife at home [phone]. Disposition Plan: This will depend on psychiatry. Spoke with nursing staff and Education officer, museum.  Consultants:  Psychiatry  Gastroenterology  Time spent: 30 minutes  Loletha Grayer  [facility]

## 2015-04-15 NOTE — Progress Notes (Addendum)
Clinical Social Worker (CSW) received consult from Dr. Weber Cooks to send patient to a Geri-psych inpatient facility. Per Dr. Weber Cooks note patient is now under IVC. Patient made a statement to RN yesterday that he does not want any medical information shared with his wife. CSW discussed case with clinical supervisor Baxter Flattery who reported that CSW should not call wife and can send Geri-psych referral because patient is under IVC. CSW faxed Geri-psych referral to the following facilities.   [facility]: Per intake they do not accept patient's on oxygen and they do not have any beds available. CSW faxed referral.   [facility]: CSW left voicemail with intake and faxed referral.   [facility]: Per intake they do accept patient's on oxygen. CSW faxed referral.   [facility]: Per intake they do not have any beds and they are not accepting referrals at this time. Intake would not give CSW updated fax number.   [facility]: Per intake they do accept patient's on oxygen however they do not have any beds. CSW faxed referral.    Blima Rich, [place] [phone]

## 2015-04-15 NOTE — Progress Notes (Signed)
Clinical Education officer, museum (CSW) also faxed referrals to the following Geri-psych facilities:  [place].   CSW will continue to follow and assist as needed.   Blima Rich, [place] [phone]

## 2015-04-15 NOTE — Progress Notes (Signed)
   04/15/15 1415  Clinical Encounter Type  Visited With Patient;Health care provider  Visit Type Follow-up  Consult/Referral To Chaplain  Spiritual Encounters  Spiritual Needs Other (Comment)  Stress Factors  Patient Stress Factors Loss of control  Family Stress Factors Exhausted  Chaplain went to visit patient and spouse. Spoke with spouse on phone after the husband called her. Offered a compassionate presence and spiritual support. Chaplain Amiyrah Lamere A. Caitlyne Ingham Ext. [phone]

## 2015-04-15 NOTE — Progress Notes (Signed)
PT Cancellation Note  Patient Details Name: [name] MRN: [id number] DOB: 08/30/1941   Cancelled Treatment:    Reason Eval/Treat Not Completed: Patient declined, no reason specified. Rx attempted, when offered pt sitting edge of bed with sitter and gruffly says "Nah, forget it!"   [name] 04/15/2015, 10:25 AM

## 2015-04-15 NOTE — Progress Notes (Signed)
   04/15/15 1128  Clinical Encounter Type  Visited With Patient;Health care provider  Visit Type Follow-up  Consult/Referral To Chaplain  Spiritual Encounters  Spiritual Needs Other (Comment)  Stress Factors  Patient Stress Factors Loss of control;Major life changes  Chaplain went to visit patient to see if he was willing to meet today as discussed. Patient appeared extremely agitated due to current situation. Will return to visit with patient and spouse after lunch. Chaplain Jahlia Omura A. Antonina Deziel Ext. [phone]

## 2015-04-15 NOTE — Progress Notes (Signed)
Notified Dr Weber Cooks pt wanted him to call his wife. MD said he will take care of it in am

## 2015-04-15 NOTE — Care Management (Signed)
Important Message  Patient Details  Name: [name] MRN: [id number] Date of Birth: [birth year]   Medicare Important Message Given:  Yes-third notification given    Juliann Pulse A Allmond 04/15/2015, 1:28 PM
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
## 2015-04-15 NOTE — Progress Notes (Signed)
Patient has not been wearing oxygen all day, states he does not want to wear it and he wants his oxygen to drop. Also states he does not want to eat because his sugar is high and states that we are making his sugar elevated. I came to round on patient and check his oxygen, and it was 96%. Patient states the machine is not right, and wants to "run around the nurses station to lower my oxygen". He is being verbally abusive to the chaplain that came in, I told patient i would be back in about an hour to check on him, and he states "i hope not." Will continue to round on patient. Sitter at bedside.

## 2015-04-16 DIAGNOSIS — F3162 Bipolar disorder, current episode mixed, moderate: Secondary | ICD-10-CM

## 2015-04-16 DIAGNOSIS — F332 Major depressive disorder, recurrent severe without psychotic features: Secondary | ICD-10-CM | POA: Insufficient documentation

## 2015-04-16 HISTORY — DX: Bipolar disorder, current episode mixed, moderate: F31.62

## 2015-04-16 LAB — GLUCOSE, CAPILLARY
GLUCOSE-CAPILLARY: 238 mg/dL — AB (ref 65–99)
Glucose-Capillary: 149 mg/dL — ABNORMAL HIGH (ref 65–99)
Glucose-Capillary: 266 mg/dL — ABNORMAL HIGH (ref 65–99)
Glucose-Capillary: 183 mg/dL — ABNORMAL HIGH (ref 65–99)

## 2015-04-16 MED ORDER — DIVALPROEX SODIUM 500 MG PO DR TAB
500.0000 mg | DELAYED_RELEASE_TABLET | Freq: Every day | ORAL | Status: DC
  Administered 2015-04-16: 500 mg via ORAL
  Filled 2015-04-16 (×2): qty 1

## 2015-04-16 MED ORDER — ALPRAZOLAM 0.5 MG PO TABS
0.5000 mg | ORAL_TABLET | ORAL | Status: AC
  Administered 2015-04-16: 0.5 mg via ORAL

## 2015-04-16 MED ORDER — PREDNISONE 5 MG PO TABS
5.0000 mg | ORAL_TABLET | Freq: Every day | ORAL | Status: DC
  Administered 2015-04-17: 5 mg via ORAL
  Filled 2015-04-16 (×2): qty 1

## 2015-04-16 NOTE — Progress Notes (Signed)
Patient ID: GRESHAM CAETANO, male   DOB: 12-08-1940, 74 y.o.   MRN: [id number] [facility] PROGRESS NOTE  PCP: Dustin Flock, MD  HPI/Subjective: I spent the entire time in the room listening to him complain about Dr. Weber Cooks the psychiatrist. He states that he will not see him again in one spray to get another psychiatrist to see him. He wants his lawyer involved. The patient states that this is not right. The patient states that Dr. Weber Cooks is not fit to make this decision of involuntary commitment. I was able to refocus him on medical issues briefly and he said no to all the questions I asked. He feels fine and he wants to go home.  Objective: Filed Vitals:   04/16/15 1121  BP: 137/76  Pulse: 129  Temp: 98.1 F (36.7 C)  Resp: 18    Intake/Output Summary (Last 24 hours) at 04/16/15 1145 Last data filed at 04/16/15 0918  Gross per 24 hour  Intake    240 ml  Output   2850 ml  Net  -2610 ml   Filed Weights   04/10/15 0524 04/10/15 0913 04/10/15 0932  Weight: 118.842 kg (262 lb) 117.346 kg (258 lb 11.2 oz) 117.391 kg (258 lb 12.8 oz)    ROS: Review of Systems  Constitutional: Negative for fever and chills.  Eyes: Negative for blurred vision.  Respiratory: Negative for cough and shortness of breath.   Cardiovascular: Negative for chest pain.  Gastrointestinal: Negative for nausea, vomiting, abdominal pain, diarrhea and constipation.  Genitourinary: Negative for dysuria.  Musculoskeletal: Negative for joint pain.  Neurological: Negative for dizziness and headaches.   Exam: Physical Exam  HENT:  Nose: No mucosal edema.  Mouth/Throat: No oropharyngeal exudate or posterior oropharyngeal edema.  Eyes: Conjunctivae, EOM and lids are normal. Pupils are equal, round, and reactive to light.  Neck: No JVD present. Carotid bruit is not present. No edema present. No thyroid mass and no thyromegaly present.  Cardiovascular: S1 normal and S2 normal.  Exam reveals no  gallop.   No murmur heard. Pulses:      Dorsalis pedis pulses are 2+ on the right side, and 2+ on the left side.  Respiratory: No respiratory distress. He has no wheezes. He has no rhonchi. He has no rales.  GI: Soft. Bowel sounds are normal. There is no tenderness.  Musculoskeletal:       Right ankle: He exhibits no swelling.       Left ankle: He exhibits no swelling.  Lymphadenopathy:    He has no cervical adenopathy.  Neurological: He is alert. No cranial nerve deficit.  Skin: Skin is warm. No rash noted. Nails show no clubbing.  Psychiatric: He has a normal mood and affect.     CBG:  Recent Labs Lab 04/15/15 1102 04/15/15 1557 04/15/15 2034 04/16/15 0749 04/16/15 1123  GLUCAP 230* 274* 127* 149* 266*     Scheduled Meds: . atorvastatin  40 mg Oral QHS  . azithromycin  500 mg Oral Daily  . budesonide-formoterol  2 puff Inhalation BID  . citalopram  40 mg Oral Daily  . guaiFENesin  600 mg Oral BID   And  . dextromethorphan  30 mg Oral BID  . insulin aspart  0-15 Units Subcutaneous TID AC & HS  . insulin aspart  8 Units Subcutaneous TID WC  . insulin glargine  20 Units Subcutaneous Daily  . iron polysaccharides  150 mg Oral BID  . lisinopril  5 mg Oral Daily  .  pantoprazole  40 mg Oral BID  . predniSONE  20 mg Oral Q breakfast  . sucralfate  1 g Oral TID WC & HS  . tiotropium  18 mcg Inhalation Daily   Continuous Infusions:   Assessment/Plan:  1. COPD exacerbation- this is stable to be treated as outpatient but he is involuntarily committed. Taper prednisone down to 10 mg tomorrow. 2. Involuntarily committed secondary to suicidal ideation. I spoke with psychiatry Dr. Weber Cooks. I told him that the patient does not want to see him anymore. He will check to see if the only other psychiatrist that is here today will see him in consultation. 3. Diabetes type 2- sugars are high secondary to steroids. 4. Gastroesophageal reflux disease without esophagitis on Protonix  and Carafate 5. Hyperlipidemia unspecified on atorvastatin.   Code Status:     Code Status Orders        Start     Ordered   04/10/15 0912  Full code   Continuous     04/10/15 0912    Advance Directive Documentation        Most Recent Value   Type of Advance Directive  Healthcare Power of Attorney   Pre-existing out of facility DNR order (yellow form or pink MOST form)     "MOST" Form in Place?       Family Communication: I spoke with wife yesterday Disposition Plan: To be determined.  Time spent: 30 minutes  Loletha Grayer  [facility]

## 2015-04-16 NOTE — Progress Notes (Signed)
Case was discussed with Zack assistant clinical social work ([place]) Mudlogger. Per Zack a referral to [facility]) for a geri-psych bed needs to be made. CSW completed [place] referral from and received [facility] authorization from [place]. Auth # [id number]. CSW faxed [place] referral. CSW also called [place] and spoke with intake about patient's demographics. CSW also called the following facilities to follow up on the geri-psych referrals that have been faxed.   [facility]: Per Anderson Malta in intake they referral has been received and they do not have any beds.   [facility]: CSW left a Tourist information centre manager. [facility]: Per intake they do not have any beds due to high acuity.   [facility]: Per intake they do not have any beds.   [facility]: Per intake they do not have any beds.   [facility]: Phone line was busy.   [facility]: Per intake they declined patient due to chronic oxygen.   [facility]: Per intake no beds available.   [facility]: Per intake referral is being review but no beds are available.   [facility]: Per intake they denied patient because "can't meet the patient's needs in a therapeutic manor."   CSW will continue to follow and assist as needed.  Blima Rich, [place] [phone]

## 2015-04-16 NOTE — Progress Notes (Signed)
Patient was upset with the psychologist Dr. Weber Cooks at the fact that he had set a order to have him further evaluation without his agreement in the matter. He referred to the doctor as "stupid, a queer and a faggot". The patient stated " Wait until I get out of this place I will ruin that doctor faggot! I will kill him! I will put drugs in his car and office and I will get him locked up for drug dealing!". No matter how much you tried to change the subject he would not let you talk or he would go right back to talking about the issue of how Dr. Weber Cooks pissed him off.

## 2015-04-16 NOTE — Progress Notes (Signed)
Pt demanding kto takl to "the one over Dr Leslye Peer" nurse informed pt Dr Manuella Ghazi in charge and is off today PT became angry states he was going to" JAP[place]AP" the one who is keeping him here. Nurse called supervisor Kennyth Lose. Pt  Informed by this nurse  That supervisor was coming to speak with him . Pt asked this nurse if the Dr  Manuella Ghazi [place], Nurse responded I don't know , now pt is accussing nurse of speaking in a tone he does not like and asked the nurse to leave and put his tray on the counter . Supervisor was present at this time and sat in chair to talk to pt.  When ever nursing tells pt what he doesn't want to hear he responds verbally abusive and raises he voice and will not allow anyone to speak or respond.

## 2015-04-16 NOTE — Progress Notes (Signed)
At 0730 pt had verbal outburst talking very loudly and rudely to sitter assigned to him stating they had a misunderstanding and that she had a bad attitude , nurse entered room ask pt to stop and that supervisor was going to be called, supervisor responded  Kennyth Lose and came to room and spent about 20 minutes talking to pt , sitter swapped out for [facility] and pt became more calm .

## 2015-04-16 NOTE — Progress Notes (Signed)
Inpatient Diabetes Program Recommendations  AACE/ADA: New Consensus Statement on Inpatient Glycemic Control (2013)  Target Ranges:  Prepandial:   less than 140 mg/dL      Peak postprandial:   less than 180 mg/dL (1-2 hours)      Critically ill patients:  140 - 180 mg/dL   Reason for assessment: elevated CBG  Diabetes history:Type 2 Outpatient Diabetes medications: Glucophage 500mg  /day Current orders for Inpatient glycemic control: Novolog correction scale 0-15units tid, novolog 8 units tid, Lantus 20 units qday  A1C 6.4% in Feb. 2016- may want to consider checking A1C while patient is in hospital to determine long term blood sugar control.  May need to re-examine medications for diabetes when patient is discharged.  Gentry Fitz, RN, BA, MHA, CDE Diabetes Coordinator Inpatient Diabetes Program  [phone] (Team Pager) [phone] ([place]) 04/16/2015 2:16 PM

## 2015-04-16 NOTE — Progress Notes (Signed)
Involuntary committed , sitter at bedside  Pt denied suicidal  thoughts states he made comment in jest and Dr Weber Cooks "Whatever his name is " committed him ,

## 2015-04-16 NOTE — Progress Notes (Signed)
Called Dr Weber Cooks concerning discharge plans for pt . Nurse shared pt desire to go home to wife . Dr Weber Cooks  Plans to call pt wife . DR Clapacs plans to re evaluate IVC status  If pt is in calm state. Pt has been calm and conversing with pt without elevation of voice since 1:1 sitter was changed to [employer]

## 2015-04-16 NOTE — Progress Notes (Signed)
Pt requested chaplain be called , nurse called chaplain Alleen Borne and she came and is still speaking with pt , pt doing most of talking

## 2015-04-16 NOTE — Progress Notes (Signed)
Physical Therapy Treatment Patient Details Name: JAYCE KAINZ MRN: [id number] DOB: 05-Mar-1941 Today's Date: 04/16/2015    History of Present Illness 74 yo male with onset of GI bleed and CHF with leukocytosis, respiratory failure is readmitted with chronic issues.  PMHx:  AAA, CHF, CAD, DM, GO bleed    PT Comments    Pt more pleasant today and agreeable to PT. Initially O2 saturation 86%; pt encouraged to sit and perform pursed lip breathing. Increased O2 saturation after a couple of minutes to 94%. Pt ambulates on room air without assistive device; post ambulation O2 saturation 92% and HR 132 bpm. Ambulation with mild unsteadiness/staggering; pt encouraged to slow pace for improved control. Pt does not comply with suggestions. Pt did not wish further ambulation, as he did not wish to overdo it. Pt refused up in chair. Pt sitting edge of bed with sitter in room.   Follow Up Recommendations        Equipment Recommendations  None recommended by PT    Recommendations for Other Services       Precautions / Restrictions Precautions Precautions: None Restrictions Weight Bearing Restrictions: No    Mobility  Bed Mobility Overal bed mobility: Modified Independent             General bed mobility comments: Rolls/sits with ease using rails  Transfers Overall transfer level: Modified independent Equipment used: None Transfers: Sit to/from Stand Sit to Stand: Independent (From bed and commode)            Ambulation/Gait Ambulation/Gait assistance: Supervision Ambulation Distance (Feet): 210 Feet (to/from bathroom ) Assistive device: None Gait Pattern/deviations: Step-through pattern;Staggering left;Staggering right   Gait velocity interpretation: at or above normal speed for age/gender     Stairs            Wheelchair Mobility    Modified Rankin (Stroke Patients Only)       Balance                                    Cognition  Arousal/Alertness: Awake/alert Behavior During Therapy: WFL for tasks assessed/performed Overall Cognitive Status: Within Functional Limits for tasks assessed                      Exercises      General Comments        Pertinent Vitals/Pain Pain Assessment: No/denies pain    Home Living                      Prior Function            PT Goals (current goals can now be found in the care plan section) Progress towards PT goals: Progressing toward goals    Frequency  Min 2X/week    PT Plan Discharge plan needs to be updated    Co-evaluation             End of Session Equipment Utilized During Treatment: Gait belt Activity Tolerance: Patient tolerated treatment well;Patient limited by fatigue Patient left:  (sitting edge of bed with sitter in the room)     Time: 9509-3267 PT Time Calculation (min) (ACUTE ONLY): 15 min  Charges:  $Gait Training: 8-22 mins                    G Codes:      [name] 04/16/2015, 11:25  AM

## 2015-04-16 NOTE — Progress Notes (Signed)
   04/16/15 1430  Clinical Encounter Type  Visited With Patient;Health care provider  Visit Type Follow-up  Consult/Referral To Chaplain  Spiritual Encounters  Spiritual Needs Other (Comment)  Stress Factors  Patient Stress Factors Loss of control;Major life changes  Chaplain rounded in the unit to visit with patient. Offered a compassionate presence and listened while he processed events that have occurred. Chaplain Romulo Okray A. Taurus Willis Ext. [phone]

## 2015-04-16 NOTE — Progress Notes (Signed)
Clinical Education officer, museum (CSW) received call back from [facility] stating that they have denied referral and patient needs a med-psych bed. CSW will continue to follow and assist as needed.   Blima Rich, [place] [phone]
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
## 2015-04-16 NOTE — Progress Notes (Signed)
Rounds by Dr Carlena Hurl , Dr Carlena Hurl entered pt room spoke with pt and answered his questions, pt was calm during visit . 1:1 sitter in room . Pt has had 3 different sitters today due to pt requesting change .

## 2015-04-16 NOTE — Care Management (Addendum)
Patient seen for home health and COPD Gold assignment. Patient still with inappropriate behaviors per staff; still IVC. I [facility] with [facility] since patient states that he was involved with [place] [employer] but he is not enrolled and refuses to get assistance from [place]. CSW following. [facility] updated that social worker will be needed if patient can safely return home at discharge. Per Mel Almond CSW, patient's wife is aware of patient's threats to "kill her" and per Mel Almond CSW "patient's wife said he always says things like that". Patient does not want anything shared regarding his medical condition with his wife. He is not on O2 currently with O2 sats in 90's. He does have chronic O2 at home provided through [facility]. Patient awaiting authorization for geri-psych unit; advised Hassan Rowan RN to check with [facility] to see if they can hold this patient as there is nothing medically being treated at this time.

## 2015-04-16 NOTE — Progress Notes (Signed)
Contacted by [employer] Nurse, Leonie Douglas.Per Hassan Rowan and [place] SW FP NA, pt had been raising his voice to the NA, and arguing with her, using racial slurs and insulting her. Patient states it had been a misunderstanding.He wanted to "take a piss" and she mistook it for "wanting to takes pills and started going off and having an attitude, telling me the nurses were giving out the pills and would get with him when they had a chance".NA was taken out of the room, told patient we would switch staff. Patient talked about "how they think I am crazy because I got frustrated with my wife because she wanted to make decisions involving our life, switching from [employer] to [employer]. I told her if she was going to make decisions like that I might as well just take a handful of pills and lay down on the railroad tracks.I was just frustrated with her. Then that fairy fudge packing take it up the asshole psychiatrist came in, he already had his mind made up that I was crazy. I could tell he was gay and didn't know what he was doing, just going along with my wife. I was just venting when I said it.". He talked about different subjects. He then said "I guess they think I am suicidal because I wouldn't wear oxygen when that smart ass physical therapist came in here. When I took off my oxygen my saturations were just fine, so I guess not everyone is so smart as they think they are". He then talked about his health problems saying "It's very frustrating to have everything go wrong". He continued a discussion about how incompetent the psychiatrist was as well as continuing with his supposition the psychiatrist was gay. He went back to the NA he was upset with being "fat and with an attitude". He stated he wanted another psychiatric opinion as he was not crazy, and was just frustrated. He stated his wife was in on "trying to get me committed for 20 or 30 days" and went back to how unhappy he was with [employer] and his wife making those  kind of decisions. Leonie Douglas, RN came into room and started to talk with the patient and give him his medicines.

## 2015-04-16 NOTE — Progress Notes (Signed)
Patient requested the second sitter since 0700 be changed. Sitter changed from [place] to [place].

## 2015-04-16 NOTE — Progress Notes (Signed)
Initial Nutrition Assessment  INTERVENTION:  Meals and Snacks: Cater to patient preferences    NUTRITION DIAGNOSIS:  Inadequate oral intake related to acute illness as evidenced by meal completion < 50% yesterday  GOAL:  Patient will meet greater than or equal to 90% of their needs   MONITOR:   (Energy Intake, Pulmonary Profile, Anthropometrics, glucose Profile)  REASON FOR ASSESSMENT:   (RD Screen, Length of Stay)    ASSESSMENT:  Pt admitted with COPD exacerbation. Pt with 1:1 sitter; psych following, pt now IVC after suicidal ideation expressed. Per Nsg note yesterday pt refusing to wear oxygen, refusing meals and medications. Pt working with PT on visit today. PMHx:  Past Medical History  Diagnosis Date  . Allergic rhinitis, cause unspecified   . Other and unspecified hyperlipidemia   . 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 Unspecified essential hypertension   . 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 Severe bullous disease of LUL. Prolonged intubation after surgeries due to COPD  . 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 Anemia     Recurrent microcytic, presumably GI   . Complication of anesthesia     trouble getting off ventilator  . Memory loss   . Diabetes   . CHF (congestive heart failure)   . CAD (coronary artery disease)   . GI bleed requiring more than 4 units of blood in 24 hours, ICU, or surgery     Colonoscopy by Dr. 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 site of blood loss unable to be identified on numerous upper, lower, and capsule endoscopies    Diet Order:  DIET SOFT Room service appropriate?: Yes; Fluid consistency:: Thin; Fluid restriction:: Other (see comments)  Current Nutrition: Pt ate 100% of breakfast this am. Per RN Hassan Rowan pt  particular.   Food/Nutrition-Related History: Pt refused all meals yesterday, however average intake up until yesterday was 96% of meals, mostly 100% of meals since admission.   Medications: Novolog, Lantus, Protonix, Carafate, Prednisone  Electrolyte/Renal Profile and Glucose Profile:   Recent Labs Lab 04/10/15 0600 04/11/15 0355 04/12/15 0428  NA 136 135 134*  K 4.5 4.2 4.3  CL 100* 99* 98*  CO2 30 25 26   BUN 21* 19 20  CREATININE 0.90 1.06 1.01  CALCIUM 8.1* 8.4* 8.5*  GLUCOSE 214* 374* 311*   Lab Results  Component Value Date   HGBA1C 6.4 12/12/2014   Glucose Profile:  Recent Labs  04/15/15 2034 04/16/15 0749 04/16/15 1123  GLUCAP 127* 149* 266*    Protein Profile: No results for input(s): ALBUMIN in the last 168 hours.  Gastrointestinal Profile: Last BM: 6/30   Weight Change: per [facility] weight relatively stable  Anthropometrics:   Height:  Ht Readings from Last 1 Encounters:  04/10/15 6\' 3"  (1.905 m)    Weight:  Wt Readings from Last 1 Encounters:  04/10/15 258 lb 12.8 oz (117.391 kg)    Wt Readings from Last 10 Encounters:  04/10/15 258 lb 12.8 oz (117.391 kg)  03/23/15 262 lb (118.842 kg)  03/09/15 262 lb (118.842 kg)  02/13/15 258 lb (117.028 kg)  12/29/14 253 lb (114.76 kg)  12/12/14 256 lb (116.121 kg)  11/17/14 256 lb (116.121 kg)  09/03/14 258 lb (117.028 kg)  08/24/14 257 lb 15 oz (117 kg)  07/11/14 261 lb (118.389 kg)    BMI:  Body mass index is 32.35 kg/(m^2).  EDUCATION NEEDS:  No education needs identified at this time   [place], [place], LDN Pager [phone]
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
## 2015-04-16 NOTE — Progress Notes (Signed)
During last conversation patient stated he should be treated as an outpatient, it should be clear to anyone that would be the best way to handle his care.
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
## 2015-04-17 ENCOUNTER — Inpatient Hospital Stay
Admit: 2015-04-17 | Discharge: 2015-04-19 | DRG: 885 | Disposition: A | Discharge status: Home / Self Care | Payer: Commercial Managed Care - HMO | Source: Intra-hospital | Attending: Psychiatry | Admitting: Psychiatry

## 2015-04-17 ENCOUNTER — Encounter: Payer: Self-pay | Admitting: Psychiatry

## 2015-04-17 DIAGNOSIS — Z9889 Other specified postprocedural states: Secondary | ICD-10-CM | POA: Diagnosis not present

## 2015-04-17 DIAGNOSIS — I2510 Atherosclerotic heart disease of native coronary artery without angina pectoris: Secondary | ICD-10-CM | POA: Diagnosis present

## 2015-04-17 DIAGNOSIS — R45851 Suicidal ideations: Secondary | ICD-10-CM | POA: Diagnosis present

## 2015-04-17 DIAGNOSIS — K219 Gastro-esophageal reflux disease without esophagitis: Secondary | ICD-10-CM | POA: Diagnosis present

## 2015-04-17 DIAGNOSIS — F332 Major depressive disorder, recurrent severe without psychotic features: Secondary | ICD-10-CM | POA: Diagnosis not present

## 2015-04-17 DIAGNOSIS — D500 Iron deficiency anemia secondary to blood loss (chronic): Secondary | ICD-10-CM | POA: Diagnosis present

## 2015-04-17 DIAGNOSIS — Z825 Family history of asthma and other chronic lower respiratory diseases: Secondary | ICD-10-CM

## 2015-04-17 DIAGNOSIS — Z833 Family history of diabetes mellitus: Secondary | ICD-10-CM

## 2015-04-17 DIAGNOSIS — I5032 Chronic diastolic (congestive) heart failure: Secondary | ICD-10-CM | POA: Diagnosis present

## 2015-04-17 DIAGNOSIS — Z87891 Personal history of nicotine dependence: Secondary | ICD-10-CM | POA: Diagnosis not present

## 2015-04-17 DIAGNOSIS — J9611 Chronic respiratory failure with hypoxia: Secondary | ICD-10-CM | POA: Diagnosis present

## 2015-04-17 DIAGNOSIS — Z8679 Personal history of other diseases of the circulatory system: Secondary | ICD-10-CM | POA: Diagnosis not present

## 2015-04-17 DIAGNOSIS — K922 Gastrointestinal hemorrhage, unspecified: Secondary | ICD-10-CM | POA: Diagnosis present

## 2015-04-17 DIAGNOSIS — J309 Allergic rhinitis, unspecified: Secondary | ICD-10-CM | POA: Diagnosis present

## 2015-04-17 DIAGNOSIS — Z8249 Family history of ischemic heart disease and other diseases of the circulatory system: Secondary | ICD-10-CM

## 2015-04-17 DIAGNOSIS — K5900 Constipation, unspecified: Secondary | ICD-10-CM | POA: Diagnosis present

## 2015-04-17 DIAGNOSIS — G894 Chronic pain syndrome: Secondary | ICD-10-CM | POA: Diagnosis present

## 2015-04-17 DIAGNOSIS — I10 Essential (primary) hypertension: Secondary | ICD-10-CM | POA: Diagnosis present

## 2015-04-17 DIAGNOSIS — J449 Chronic obstructive pulmonary disease, unspecified: Secondary | ICD-10-CM | POA: Diagnosis present

## 2015-04-17 DIAGNOSIS — F3162 Bipolar disorder, current episode mixed, moderate: Secondary | ICD-10-CM | POA: Diagnosis present

## 2015-04-17 DIAGNOSIS — F3160 Bipolar disorder, current episode mixed, unspecified: Secondary | ICD-10-CM | POA: Diagnosis not present

## 2015-04-17 DIAGNOSIS — E119 Type 2 diabetes mellitus without complications: Secondary | ICD-10-CM | POA: Diagnosis present

## 2015-04-17 DIAGNOSIS — I252 Old myocardial infarction: Secondary | ICD-10-CM | POA: Diagnosis not present

## 2015-04-17 DIAGNOSIS — R45850 Homicidal ideations: Secondary | ICD-10-CM | POA: Diagnosis present

## 2015-04-17 DIAGNOSIS — Z9049 Acquired absence of other specified parts of digestive tract: Secondary | ICD-10-CM | POA: Diagnosis present

## 2015-04-17 DIAGNOSIS — Z9981 Dependence on supplemental oxygen: Secondary | ICD-10-CM | POA: Diagnosis not present

## 2015-04-17 DIAGNOSIS — Z951 Presence of aortocoronary bypass graft: Secondary | ICD-10-CM

## 2015-04-17 DIAGNOSIS — G4700 Insomnia, unspecified: Secondary | ICD-10-CM | POA: Diagnosis present

## 2015-04-17 LAB — GLUCOSE, CAPILLARY
GLUCOSE-CAPILLARY: 96 mg/dL (ref 65–99)
Glucose-Capillary: 172 mg/dL — ABNORMAL HIGH (ref 65–99)
Glucose-Capillary: 189 mg/dL — ABNORMAL HIGH (ref 65–99)
Glucose-Capillary: 179 mg/dL — ABNORMAL HIGH (ref 65–99)

## 2015-04-17 LAB — BASIC METABOLIC PANEL
Anion gap: 10 (ref 5–15)
BUN: 19 mg/dL (ref 6–20)
CALCIUM: 8.5 mg/dL — AB (ref 8.9–10.3)
CHLORIDE: 103 mmol/L (ref 101–111)
CO2: 27 mmol/L (ref 22–32)
Creatinine, Ser: 1.19 mg/dL (ref 0.61–1.24)
GFR calc non Af Amer: 59 mL/min — ABNORMAL LOW (ref >60)
GLUCOSE: 158 mg/dL — AB (ref 65–99)
POTASSIUM: 4 mmol/L (ref 3.5–5.1)
Sodium: 140 mmol/L (ref 135–145)

## 2015-04-17 LAB — HEMOGLOBIN A1C: HEMOGLOBIN A1C: 7 % — AB (ref 4.0–6.0)

## 2015-04-17 MED ORDER — METOPROLOL TARTRATE 25 MG PO TABS
25.0000 mg | ORAL_TABLET | Freq: Two times a day (BID) | ORAL | Status: DC
  Administered 2015-04-17: 25 mg via ORAL
  Filled 2015-04-17 (×3): qty 1

## 2015-04-17 MED ORDER — ATORVASTATIN CALCIUM 40 MG PO TABS
40.0000 mg | ORAL_TABLET | Freq: Every day | ORAL | Status: DC
  Administered 2015-04-17: 40 mg via ORAL
  Filled 2015-04-17 (×4): qty 1

## 2015-04-17 MED ORDER — INSULIN GLARGINE 100 UNIT/ML ~~LOC~~ SOLN: 12.0000 [IU] | Freq: Every day | SUBCUTANEOUS | Status: DC

## 2015-04-17 MED ORDER — ALBUTEROL SULFATE (2.5 MG/3ML) 0.083% IN NEBU
3.0000 mL | INHALATION_SOLUTION | Freq: Four times a day (QID) | RESPIRATORY_TRACT | Status: DC | PRN
  Administered 2015-04-18: 3 mL via RESPIRATORY_TRACT
  Filled 2015-04-17: qty 3

## 2015-04-17 MED ORDER — INSULIN ASPART 100 UNIT/ML ~~LOC~~ SOLN
0.0000 [IU] | Freq: Three times a day (TID) | SUBCUTANEOUS | Status: DC
Start: 2015-04-17 — End: 2015-04-17

## 2015-04-17 MED ORDER — INSULIN ASPART 100 UNIT/ML ~~LOC~~ SOLN: 0.0000 [IU] | Freq: Three times a day (TID) | SUBCUTANEOUS | Status: DC

## 2015-04-17 MED ORDER — INSULIN ASPART 100 UNIT/ML ~~LOC~~ SOLN
0.0000 [IU] | Freq: Three times a day (TID) | SUBCUTANEOUS | Status: DC
  Administered 2015-04-18: 2 [IU] via SUBCUTANEOUS
  Filled 2015-04-17: qty 2

## 2015-04-17 MED ORDER — BUDESONIDE-FORMOTEROL FUMARATE 160-4.5 MCG/ACT IN AERO
2.0000 | INHALATION_SPRAY | Freq: Two times a day (BID) | RESPIRATORY_TRACT | Status: DC
  Administered 2015-04-17: 2 via RESPIRATORY_TRACT
  Filled 2015-04-17: qty 6

## 2015-04-17 MED ORDER — METFORMIN HCL 500 MG PO TABS
500.0000 mg | ORAL_TABLET | Freq: Every day | ORAL | Status: DC
  Filled 2015-04-17: qty 1

## 2015-04-17 MED ORDER — TIOTROPIUM BROMIDE MONOHYDRATE 18 MCG IN CAPS
18.0000 ug | ORAL_CAPSULE | Freq: Every day | RESPIRATORY_TRACT | Status: DC
  Filled 2015-04-17: qty 5

## 2015-04-17 MED ORDER — ACETAMINOPHEN 325 MG PO TABS: 650.0000 mg | ORAL_TABLET | Freq: Four times a day (QID) | ORAL | Status: DC | PRN

## 2015-04-17 MED ORDER — INSULIN ASPART 100 UNIT/ML ~~LOC~~ SOLN: 0.0000 [IU] | Freq: Every day | SUBCUTANEOUS | Status: DC

## 2015-04-17 MED ORDER — DIVALPROEX SODIUM 500 MG PO DR TAB
500.0000 mg | DELAYED_RELEASE_TABLET | Freq: Every day | ORAL | Status: DC
  Administered 2015-04-17: 500 mg via ORAL
  Filled 2015-04-17 (×2): qty 1

## 2015-04-17 MED ORDER — ZOLPIDEM TARTRATE 5 MG PO TABS
5.0000 mg | ORAL_TABLET | Freq: Every day | ORAL | Status: DC
  Administered 2015-04-17: 5 mg via ORAL
  Filled 2015-04-17 (×2): qty 1

## 2015-04-17 MED ORDER — TRAZODONE HCL 50 MG PO TABS
50.0000 mg | ORAL_TABLET | Freq: Three times a day (TID) | ORAL | Status: DC
  Administered 2015-04-17 – 2015-04-18 (×2): 50 mg via ORAL
  Filled 2015-04-17 (×4): qty 1

## 2015-04-17 MED ORDER — INSULIN GLARGINE 100 UNIT/ML ~~LOC~~ SOLN
12.0000 [IU] | Freq: Every day | SUBCUTANEOUS | Status: DC
Start: 2015-04-18 — End: 2015-04-19
  Filled 2015-04-17 (×3): qty 0.12

## 2015-04-17 MED ORDER — PANTOPRAZOLE SODIUM 40 MG PO TBEC: 40.0000 mg | DELAYED_RELEASE_TABLET | Freq: Two times a day (BID) | ORAL | Status: DC

## 2015-04-17 MED ORDER — CITALOPRAM HYDROBROMIDE 20 MG PO TABS
40.0000 mg | ORAL_TABLET | Freq: Every day | ORAL | Status: DC
  Administered 2015-04-17: 40 mg via ORAL
  Filled 2015-04-17 (×2): qty 2

## 2015-04-17 MED ORDER — INSULIN ASPART 100 UNIT/ML ~~LOC~~ SOLN
5.0000 [IU] | Freq: Three times a day (TID) | SUBCUTANEOUS | Status: DC
  Administered 2015-04-18: 5 [IU] via SUBCUTANEOUS
  Filled 2015-04-17 (×2): qty 5

## 2015-04-17 MED ORDER — ALUM & MAG HYDROXIDE-SIMETH 200-200-20 MG/5ML PO SUSP: 30.0000 mL | ORAL | Status: DC | PRN

## 2015-04-17 MED ORDER — INSULIN ASPART 100 UNIT/ML ~~LOC~~ SOLN: 5.0000 [IU] | Freq: Three times a day (TID) | SUBCUTANEOUS | Status: DC

## 2015-04-17 MED ORDER — SUCRALFATE 1 G PO TABS
1.0000 g | ORAL_TABLET | Freq: Three times a day (TID) | ORAL | Status: DC
  Administered 2015-04-17 – 2015-04-18 (×2): 1 g via ORAL
  Filled 2015-04-17 (×3): qty 1

## 2015-04-17 MED ORDER — ALPRAZOLAM 0.5 MG PO TABS: 0.5000 mg | ORAL_TABLET | Freq: Three times a day (TID) | ORAL | Status: DC | PRN

## 2015-04-17 MED ORDER — MAGNESIUM HYDROXIDE 400 MG/5ML PO SUSP: 30.0000 mL | Freq: Every day | ORAL | Status: DC | PRN

## 2015-04-17 MED ORDER — PANTOPRAZOLE SODIUM 40 MG PO TBEC
40.0000 mg | DELAYED_RELEASE_TABLET | Freq: Two times a day (BID) | ORAL | Status: DC
  Administered 2015-04-17: 40 mg via ORAL
  Filled 2015-04-17 (×3): qty 1

## 2015-04-17 MED ORDER — FOLIC ACID 1 MG PO TABS
2.0000 mg | ORAL_TABLET | Freq: Every morning | ORAL | Status: DC
  Filled 2015-04-17: qty 2

## 2015-04-17 MED ORDER — DIVALPROEX SODIUM 500 MG PO DR TAB: 500.0000 mg | DELAYED_RELEASE_TABLET | Freq: Every day | ORAL | Status: DC

## 2015-04-17 MED ORDER — LISINOPRIL 5 MG PO TABS
5.0000 mg | ORAL_TABLET | Freq: Every day | ORAL | Status: DC
  Administered 2015-04-17: 5 mg via ORAL
  Filled 2015-04-17 (×2): qty 1

## 2015-04-17 NOTE — Discharge Summary (Signed)
[facility] [place] NAME: [name]    MR#:  [id number]  DATE OF BIRTH:  1941/08/21  DATE OF ADMISSION:  04/10/2015 ADMITTING PHYSICIAN: Loletha Grayer, MD  DATE OF DISCHARGE: 04/17/2015   ADMISSION DIAGNOSIS:  Respiratory distress [R06.00] Iron deficiency anemia due to chronic blood loss [D50.0] COPD with acute exacerbation [J44.1]  DISCHARGE DIAGNOSIS:  Principal Problem:   Bipolar 1 disorder, mixed, moderate Active Problems:   COPD exacerbation   Severe major depression without psychotic features   Major depressive disorder, recurrent, severe without psychotic features   SECONDARY DIAGNOSIS:   Past Medical History  Diagnosis Date  . Allergic rhinitis, cause unspecified   . Other and unspecified hyperlipidemia   . 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 Unspecified essential hypertension   . 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 Severe bullous disease of LUL. Prolonged intubation after surgeries due to COPD  . 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 Anemia     Recurrent microcytic, presumably GI   . Complication of anesthesia     trouble getting off ventilator  . Memory loss   . Diabetes   . CHF (congestive heart failure)   . CAD (coronary artery disease)   . GI bleed requiring more than 4 units of blood in 24 hours, ICU, or surgery     Colonoscopy by Dr. 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 site of blood loss unable to be identified on numerous upper, lower, and capsule endoscopies    HOSPITAL COURSE:   1. COPD exacerbation- this was treated initially with IV steroids. Switched over to by mouth steroids. And tapered off. Finish course of antibiotics. Lungs are clear upon discharge. 2. Suicidal  ideation- patient is involuntary committed by psychiatry. Patient hopefully will be transferred to the psychiatric floor on 04/17/2015 for further observation. Psychiatric medication as per Dr. Weber Cooks. 3. Anemia unspecified. Patient is on Protonix and Carafate. Hemoglobin is stable. Iron. 4. Diabetes type 2- will decrease dose of Lantus. Continue sliding scale.  DISCHARGE CONDITIONS:   Patient be discharged to psychiatry floor  CONSULTS OBTAINED:  Treatment Team:  Manya Silvas, MD Gonzella Lex, MD  DRUG ALLERGIES:   Allergies  Allergen Reactions  . Penicillins Anaphylaxis and Hives  . Demerol [Meperidine] Other (See Comments)    hallucinations  . Dilaudid [Hydromorphone Hcl] Other (See Comments)    hallucinations  . Levaquin [Levofloxacin In D5w]     Nausea and diarrhea  . Morphine And Related Nausea Only and Other (See Comments)    hallucinations    DISCHARGE MEDICATIONS:   Current Discharge Medication List    START taking these medications   Details  divalproex (DEPAKOTE) 500 MG DR tablet Take 1 tablet (500 mg total) by mouth at bedtime. Qty: 1 tablet, Refills: 0    !! insulin aspart (NOVOLOG) 100 UNIT/ML injection Inject 0-15 Units into the skin 4 (four) times daily -  before meals and at bedtime. Qty: 10 mL, Refills: 11    !! insulin aspart (NOVOLOG) 100 UNIT/ML injection Inject 5 Units into the skin 3 (three) times daily with meals. Qty: 10 mL, Refills: 11    insulin glargine (LANTUS) 100 UNIT/ML injection Inject 0.12 mLs (12 Units total) into the skin  daily. Qty: 10 mL, Refills: 11     !! - Potential duplicate medications found. Please discuss with provider.    CONTINUE these medications which have NOT CHANGED   Details  albuterol (PROVENTIL HFA;VENTOLIN HFA) 108 (90 BASE) MCG/ACT inhaler Inhale 2 puffs into the lungs every 6 (six) hours as needed for wheezing.    ALPRAZolam (XANAX) 0.5 MG tablet Take 1 tablet (0.5 mg total) by mouth 3 (three) times  daily as needed for anxiety (for shortness of breath). Qty: 20 tablet, Refills: 2    atorvastatin (LIPITOR) 40 MG tablet Take 1 tablet (40 mg total) by mouth at bedtime. <please make appointment for refills> Qty: 90 tablet, Refills: 0    budesonide-formoterol (SYMBICORT) 160-4.5 MCG/ACT inhaler Inhale 2 puffs into the lungs 2 (two) times daily. Qty: 1 Inhaler, Refills: 11    Calcium Carbonate Antacid (ROLAIDS EXTRA STRENGTH PO) Take 2 tablets by mouth daily as needed (indigestion).    citalopram (CELEXA) 40 MG tablet TAKE 1 TABLET (40 MG TOTAL) BY MOUTH DAILY. Qty: 30 tablet, Refills: 5    folic acid (FOLVITE) 1 MG tablet Take 2 tablets (2 mg total) by mouth every morning. Need appointment before anymore refills Qty: 60 tablet, Refills: 0    HYDROcodone-acetaminophen (NORCO/VICODIN) 5-325 MG per tablet Take 1 tablet by mouth every 12 (twelve) hours as needed for moderate pain. Qty: 60 tablet, Refills: 0    iron polysaccharides (NIFEREX) 150 MG capsule Take 150 mg by mouth 2 (two) times daily.    lisinopril (PRINIVIL,ZESTRIL) 5 MG tablet Take 5 mg by mouth daily.     metFORMIN (GLUCOPHAGE) 500 MG tablet Take 1 tablet (500 mg total) by mouth daily. Qty: 30 tablet, Refills: 1    metoprolol tartrate (LOPRESSOR) 25 MG tablet Take 1 tablet (25 mg total) by mouth 2 (two) times daily. Need appointment before anymore refills Qty: 60 tablet, Refills: 0    pantoprazole (PROTONIX) 40 MG tablet Take 1 tablet (40 mg total) by mouth 2 (two) times daily. Need appointment before anymore refills Qty: 60 tablet, Refills: 0    sucralfate (CARAFATE) 1 G tablet Take 1 tablet (1 g total) by mouth 4 (four) times daily -  with meals and at bedtime. Qty: 90 tablet, Refills: 0    tiotropium (SPIRIVA) 18 MCG inhalation capsule Place 1 capsule (18 mcg total) into inhaler and inhale daily. Qty: 30 capsule, Refills: 3    zolpidem (AMBIEN) 5 MG tablet TAKE 1 TABLET BY MOUTH AT BEDTIME AS NEEDED FOR  SLEEP Qty: 30 tablet, Refills: 5    Vitamin D, Ergocalciferol, (DRISDOL) 50000 UNITS CAPS capsule TAKE 1 CAPSULE (50,000 UNITS TOTAL) BY MOUTH ONCE A WEEK.  'OV NEEDED" Qty: 12 capsule, Refills: 0         DISCHARGE INSTRUCTIONS:   Follow-up with Dr. Lissa Hoard packs one day psychiatry  If you experience worsening of your admission symptoms, develop shortness of breath, life threatening emergency, suicidal or homicidal thoughts you must seek medical attention immediately by calling 911 or calling your MD immediately  if symptoms less severe.  You Must read complete instructions/literature along with all the possible adverse reactions/side effects for all the Medicines you take and that have been prescribed to you. Take any new Medicines after you have completely understood and accept all the possible adverse reactions/side effects.   Please note  You were cared for by a hospitalist during your hospital stay. If you have any questions about your discharge medications or the care you  received while you were in the hospital after you are discharged, you can call the unit and asked to speak with the hospitalist on call if the hospitalist that took care of you is not available. Once you are discharged, your primary care physician will handle any further medical issues. Please note that NO REFILLS for any discharge medications will be authorized once you are discharged, as it is imperative that you return to your primary care physician (or establish a relationship with a primary care physician if you do not have one) for your aftercare needs so that they can reassess your need for medications and monitor your lab values.    Today   CHIEF COMPLAINT:   Chief Complaint  Patient presents with  . Cough    HISTORY OF PRESENT ILLNESS:  [name]  is a 75 y.o. male with a known history of COPD. Patient presented with shortness of breath and was treated for COPD exacerbation   VITAL SIGNS:   Blood pressure 159/79, pulse 113, temperature 97.6 F (36.4 C), temperature source Oral, resp. rate 20, height 6\' 3"  (1.905 m), weight 117.391 kg (258 lb 12.8 oz), SpO2 96 %.  I/O:    Intake/Output Summary (Last 24 hours) at 04/17/15 1345 Last data filed at 04/17/15 0900  Gross per 24 hour  Intake    520 ml  Output    752 ml  Net   -232 ml    DATA REVIEW:   CBC  Recent Labs Lab 04/13/15 0447  WBC 10.0  HGB 8.7*  HCT 27.7*  PLT 254    Chemistries   Recent Labs Lab 04/17/15 0620  NA 140  K 4.0  CL 103  CO2 27  GLUCOSE 158*  BUN 19  CREATININE 1.19  CALCIUM 8.5*   Management plans discussed with the patient, family and they are in agreement.  CODE STATUS:     Code Status Orders        Start     Ordered   04/10/15 0912  Full code   Continuous     04/10/15 0912    Advance Directive Documentation        Most Recent Value   Type of Advance Directive  Healthcare Power of Attorney   Pre-existing out of facility DNR order (yellow form or pink MOST form)     "MOST" Form in Place?        TOTAL TIME TAKING CARE OF THIS PATIENT: 35 minutes.    Loletha Grayer M.D on 04/17/2015 at 1:45 PM  Between 7am to 6pm - Pager - [phone]  After 6pm go to [email or web address] - [facility]  Office  [phone]

## 2015-04-17 NOTE — Discharge Instructions (Signed)
Chronic Obstructive Pulmonary Disease Exacerbation °Chronic obstructive pulmonary disease (COPD) is a common lung condition in which airflow from the lungs is limited. COPD is a general term that can be used to describe many different lung problems that limit airflow, including chronic bronchitis and emphysema. COPD exacerbations are episodes when breathing symptoms become much worse and require extra treatment. Without treatment, COPD exacerbations can be life threatening, and frequent COPD exacerbations can cause further damage to your lungs. °CAUSES  °· Respiratory infections.   °· Exposure to smoke.   °· Exposure to air pollution, chemical fumes, or dust. °Sometimes there is no apparent cause or trigger. °RISK FACTORS °· Smoking cigarettes. °· Older age. °· Frequent prior COPD exacerbations. °SIGNS AND SYMPTOMS  °· Increased coughing.   °· Increased thick spit (sputum) production.   °· Increased wheezing.   °· Increased shortness of breath.   °· Rapid breathing.   °· Chest tightness. °DIAGNOSIS  °Your medical history, a physical exam, and tests will help your health care provider make a diagnosis. Tests may include: °· A chest X-ray. °· Basic lab tests. °· Sputum testing. °· An arterial blood gas test. °TREATMENT  °Depending on the severity of your COPD exacerbation, you may need to be admitted to a hospital for treatment. Some of the treatments commonly used to treat COPD exacerbations are:  °· Antibiotic medicines.   °· Bronchodilators. These are drugs that expand the air passages. They may be given with an inhaler or nebulizer. Spacer devices may be needed to help improve drug delivery. °· Corticosteroid medicines. °· Supplemental oxygen therapy.   °HOME CARE INSTRUCTIONS  °· Do not smoke. Quitting smoking is very important to prevent COPD from getting worse and exacerbations from happening as often. °· Avoid exposure to all substances that irritate the airway, especially to tobacco smoke.   °· If you were  prescribed an antibiotic medicine, finish it all even if you start to feel better. °· Take all medicines as directed by your health care provider. It is important to use correct technique with inhaled medicines. °· Drink enough fluids to keep your urine clear or pale yellow (unless you have a medical condition that requires fluid restriction). °· Use a cool mist vaporizer. This makes it easier to clear your chest when you cough.   °· If you have a home nebulizer and oxygen, continue to use them as directed.   °· Maintain all necessary vaccinations to prevent infections.   °· Exercise regularly.   °· Eat a healthy diet.   °· Keep all follow-up appointments as directed by your health care provider. °SEEK IMMEDIATE MEDICAL CARE IF: °· You have worsening shortness of breath.   °· You have trouble talking.   °· You have severe chest pain. °· You have blood in your sputum.  °· You have a fever. °· You have weakness, vomit repeatedly, or faint.   °· You feel confused.   °· You continue to get worse. °MAKE SURE YOU:  °· Understand these instructions. °· Will watch your condition. °· Will get help right away if you are not doing well or get worse. °Document Released: 07/31/2007 Document Revised: 02/17/2014 Document Reviewed: 06/07/2013 °ExitCare® Patient Information ©2015 [employer]. This information is not intended to replace advice given to you by your health care provider. Make sure you discuss any questions you have with your health care provider. ° °

## 2015-04-17 NOTE — Progress Notes (Signed)
Physical Therapy Treatment Patient Details Name: DENSEL KRONICK MRN: [id number] DOB: 1941-06-05 Today's Date: 04/17/2015    History of Present Illness 74 yo male with onset of GI bleed and CHF with leukocytosis, respiratory failure is readmitted with chronic issues.  PMHx:  AAA, CHF, CAD, DM, GO bleed    PT Comments    Pt agreeable to PT today, as he states to perform for the therapist and "get a good report". Pt very verbally sexually inappropriate today and use foul language. Pt argumentative and sarcastic throughout. Pt did perform steps today, although had significant LOB initially on first step, as pt refused to use a railing as instructed. With Mod A and grabbing railing, pt recovered without incident. Therapist demanded pt use rail at this point; pt compliant, but continued to require Min A especially descending. Pt audibly SOB post 6 steps, but argues that he can do all 81 steps. Post ambulation/steps O2 saturation decreased to 94% and heart rate increased to 132 bpm. Recovered to 96% O2 saturation and 115 bpm in 2 minutes. Pt encouraged to walk with nursing during the day to increase endurance; pt notes he would rather lie around all day even though her notes that is not helping him.    Follow Up Recommendations        Equipment Recommendations  None recommended by PT    Recommendations for Other Services       Precautions / Restrictions Precautions Precautions: None Restrictions Weight Bearing Restrictions: No    Mobility  Bed Mobility Overal bed mobility: Modified Independent                Transfers Overall transfer level: Independent (uses bed and table to push off/balance for stand/sit) Equipment used: None Transfers: Sit to/from Stand Sit to Stand: Independent Stand pivot transfers:  (uses bed and table to push/balance from )          Ambulation/Gait Ambulation/Gait assistance: Supervision Ambulation Distance (Feet): 220 Feet Assistive device:  None Gait Pattern/deviations: Step-through pattern;Drifts right/left;Wide base of support (increased sway right and left)   Gait velocity interpretation: at or above normal speed for age/gender General Gait Details: mild unsteadiness, sways side to side   Stairs Stairs: Yes Stairs assistance: Min assist Stair Management: One rail Left Number of Stairs: 6 General stair comments: Pt attempts initially without rails against instruction of therapist with LOB requiring Mod A to correct. Pt compliant with using rail at command of therapist post LOB. Cues to place whole foot on step. Decreased eccentric control descending steps requiring Min A  Wheelchair Mobility    Modified Rankin (Stroke Patients Only)       Balance           Standing balance support: No upper extremity supported Standing balance-Leahy Scale: Good                      Cognition Arousal/Alertness: Awake/alert Behavior During Therapy:  (Inappropriate language and sexual in nature) Overall Cognitive Status: Within Functional Limits for tasks assessed                      Exercises      General Comments        Pertinent Vitals/Pain Pain Assessment: No/denies pain    Home Living                      Prior Function  PT Goals (current goals can now be found in the care plan section) Progress towards PT goals: Progressing toward goals    Frequency  Min 2X/week    PT Plan Discharge plan needs to be updated    Co-evaluation             End of Session Equipment Utilized During Treatment: Gait belt Activity Tolerance: Patient tolerated treatment well;Patient limited by fatigue (limited by increased HR with mild activity ) Patient left:  (edge of bed; sitter in room)     Time: 9643-8381 PT Time Calculation (min) (ACUTE ONLY): 27 min  Charges:  $Gait Training: 23-37 mins                    G Codes:      [name] 04/17/2015, 10:48 AM

## 2015-04-17 NOTE — Progress Notes (Signed)
Inpatient Diabetes Program Recommendations  AACE/ADA: New Consensus Statement on Inpatient Glycemic Control (2013)  Target Ranges:  Prepandial:   less than 140 mg/dL      Peak postprandial:   less than 180 mg/dL (1-2 hours)      Critically ill patients:  140 - 180 mg/dL   Reason for assessment: elevated CBG  Diabetes history:Type 2 Outpatient Diabetes medications: Glucophage 500mg  /day Current orders for Inpatient glycemic control: Novolog correction scale 0-15units tid, Novolog 8 units tid, Lantus 20 units qday  Consider increasing Novolog mealtime insulin form 8 units to  Novolog 10 units tid  Gentry Fitz, RN, [place], [place], CDE Diabetes Coordinator Inpatient Diabetes Program  [phone] (Team Pager) [phone] ([place]) 04/17/2015 10:42 AM

## 2015-04-17 NOTE — Progress Notes (Signed)
Patient ID: [name], male   DOB: Jun 13, 1941, 74 y.o.   MRN: [id number] [facility] PROGRESS NOTE  HPI/Subjective: Patient now states that I have all the power and he is bowing down to me because he has no control anymore. He asked me if he needs to kiss my ring. He states he now has no control over his life. Physically he feels fine. No complaints of shortness of breath or cough.  Objective: Filed Vitals:   04/17/15 0957  BP:   Pulse: 113  Temp:   Resp:     Intake/Output Summary (Last 24 hours) at 04/17/15 1329 Last data filed at 04/17/15 0900  Gross per 24 hour  Intake    520 ml  Output    752 ml  Net   -232 ml   Filed Weights   04/10/15 0524 04/10/15 0913 04/10/15 0932  Weight: 118.842 kg (262 lb) 117.346 kg (258 lb 11.2 oz) 117.391 kg (258 lb 12.8 oz)    ROS: Review of Systems  Constitutional: Negative for fever and chills.  Eyes: Negative for blurred vision.  Respiratory: Negative for cough and shortness of breath.   Cardiovascular: Negative for chest pain.  Gastrointestinal: Negative for nausea, vomiting, abdominal pain, diarrhea and constipation.  Genitourinary: Negative for dysuria.  Musculoskeletal: Negative for joint pain.  Neurological: Negative for dizziness and headaches.   Exam: Physical Exam  HENT:  Nose: No mucosal edema.  Mouth/Throat: No oropharyngeal exudate or posterior oropharyngeal edema.  Eyes: Conjunctivae, EOM and lids are normal. Pupils are equal, round, and reactive to light.  Neck: No JVD present. Carotid bruit is not present. No edema present. No thyroid mass and no thyromegaly present.  Cardiovascular: S1 normal and S2 normal.  Exam reveals no gallop.   No murmur heard. Pulses:      Dorsalis pedis pulses are 2+ on the right side, and 2+ on the left side.  Respiratory: No respiratory distress. He has no wheezes. He has no rhonchi. He has no rales.  GI: Soft. Bowel sounds are normal. There is no tenderness.   Musculoskeletal:       Right ankle: He exhibits no swelling.       Left ankle: He exhibits no swelling.  Lymphadenopathy:    He has no cervical adenopathy.  Neurological: He is alert. No cranial nerve deficit.  Skin: Skin is warm. No rash noted. Nails show no clubbing.  Psychiatric: He has a normal mood and affect.     CBG:  Recent Labs Lab 04/16/15 1123 04/16/15 1546 04/16/15 2122 04/17/15 0731 04/17/15 1151  GLUCAP 266* 183* 238* 172* 189*     Scheduled Meds: . atorvastatin  40 mg Oral QHS  . budesonide-formoterol  2 puff Inhalation BID  . citalopram  40 mg Oral Daily  . guaiFENesin  600 mg Oral BID   And  . dextromethorphan  30 mg Oral BID  . divalproex  500 mg Oral QHS  . insulin aspart  0-15 Units Subcutaneous TID AC & HS  . insulin aspart  8 Units Subcutaneous TID WC  . insulin glargine  20 Units Subcutaneous Daily  . iron polysaccharides  150 mg Oral BID  . lisinopril  5 mg Oral Daily  . pantoprazole  40 mg Oral BID  . sucralfate  1 g Oral TID WC & HS  . tiotropium  18 mcg Inhalation Daily    Assessment/Plan:  1. COPD exacerbation- this is stable to be treated as outpatient but he is  involuntarily committed. Discontinue steroids 2. Involuntarily committed secondary to suicidal ideation. I spoke with psychiatry Dr. Weber Cooks. The patient may be excepted downstairs for further psychiatric monitoring. Continue psychiatric medication as per Dr. Lissa Hoard packs 3. Diabetes type 2- I stop steroids hopefully sugars will trend better. On this morning's chemistry glucose was 158. 4. Gastroesophageal reflux disease without esophagitis on Protonix and Carafate. 5. Hyperlipidemia unspecified on atorvastatin.   Code Status:     Code Status Orders        Start     Ordered   04/10/15 0912  Full code   Continuous     04/10/15 0912    Advance Directive Documentation        Most Recent Value   Type of Advance Directive  Healthcare Power of Attorney   Pre-existing out  of facility DNR order (yellow form or pink MOST form)     "MOST" Form in Place?       Family Communication: I spoke with wife yesterday evening. Disposition Plan: Hopefully to the psychiatry floor.  Time spent: 30 minutes  Loletha Grayer  [facility]

## 2015-04-17 NOTE — Progress Notes (Signed)
Patient transferred to [facility] today. [place] RN Opal Sidles called Clinical Social Worker (CSW) back and requested a new GI consult. CSW made Opal Sidles aware that there was not GI consult this admission.   Blima Rich, [place] [phone]

## 2015-04-17 NOTE — Care Management (Signed)
Important Message  Patient Details  Name: [name] MRN: [id number] Date of Birth: March 22, 1941   Medicare Important Message Given:  Yes-fourth notification given    [name] 04/17/2015, 12:21 PM

## 2015-04-17 NOTE — Progress Notes (Signed)
Clinical Education officer, museum (CSW) received call from [facility] nurse requesting additional clinicals. CSW faxed requested clinicals today to [facility]. CSW contacted intake at [facility] and made them aware of above. CSW will continue to follow and assist as needed.   Blima Rich, [place] [phone]

## 2015-04-17 NOTE — Consult Note (Signed)
  Psychiatry: Follow-up for this 74 year old man with labile mood agitated behavior and multiple threats to self and others. Chart reviewed patient interviewed. Patient today says he is doing "just fine". He denies that he is depressed and denies suicidal or homicidal ideation. Patient reports that physically he is feeling much better. No shortness of breath. He had some diarrhea and nausea last night which is improved today. No other physical complaints. He denies hallucinations and denies suicidality.  On mental status this is an adequately groomed gentleman who looks his stated age. Eye contact good. Psychomotor activity normal. Speech normal rate tone and volume. Affect remains irritated and sarcastic. Speech is at times a little bit pressured. Patient hasn't argumentative attitude. Continues to deny when talking to me any suicidal or homicidal ideation. As of yesterday he was still making threats in the presence of multiple staff members. I prescribed Depakote for him last night which she evidently has taken without any side effects yet.  Now that he is off of all oxygen and IV fluids and is able to ambulate on his own I think he is appropriate for admission to the psychiatry ward. Case discussed with psychiatry bed coordinator and staff here on the ward. Also hospitalist. Orders done for readmission after discharge. Continue current medications in the hospital.  Plan discussed with patient. Questions answered. Diagnosis bipolar disorder mixed episode.

## 2015-04-17 NOTE — Progress Notes (Signed)
Clinical Education officer, museum (CSW) received second call from [facility] requesting a GI note. CSW faxed only GI note in patient's chart from last admission. CSW contacted [facility] RN and made her aware of above. CSW will continue to follow and assist as needed.   Blima Rich, [place] [phone]

## 2015-04-17 NOTE — Progress Notes (Signed)
Patient was escorted onto unit. Skin assessment performed.  Patient has mid sternal scars from chest surgery. He also has abdominal scar umbilicus area. No contraband found.  He was escorted to room and given something to drink.  Patient was belligerent and oppositional.  Attempts were made to try and make patient feel welcome without success. Staff will monitor for safety until admission can be performed.  Dr. Bary Leriche notified that patient is on the unit.  Awaiting orders.

## 2015-04-18 DIAGNOSIS — F3160 Bipolar disorder, current episode mixed, unspecified: Principal | ICD-10-CM

## 2015-04-18 LAB — GLUCOSE, CAPILLARY
GLUCOSE-CAPILLARY: 141 mg/dL — AB (ref 65–99)
Glucose-Capillary: 129 mg/dL — ABNORMAL HIGH (ref 65–99)

## 2015-04-18 MED ORDER — BISACODYL 5 MG PO TBEC: 5.0000 mg | DELAYED_RELEASE_TABLET | Freq: Every day | ORAL | Status: DC | PRN

## 2015-04-18 NOTE — Progress Notes (Signed)
Patient is alert and oriented x 4. His mood is very irritable, angry with congruent affect. Patient denies SI or HI. He adds that there is no reason for him to be admitted to the "fruitcake" unit. No evidence of psychotic thinking. Speech is clear, coherent but frequently blaming and sarcastic. Thoughts are organized and linear.  Patient states that he has no intention of leaving his room. He refused breakfast because he was "not hungry." Patient was due to have insulin this am but medication was held until the patient would eat. Teaching was done re importance of taking insulin along with food and patient was encouraged to eat something - he refused to eat or take insulin stating he was not a diabetic. Patient also refused all other medications. Refused education re importance of medication compliance.  Nurse attempted to establish therapeutic alliance with patient but patient stated he had "no intention of going along" with this hospitalization because he only came to the ED for a blood transfusion and he does not have any mental health issues.  Will continue to try to engage patient in treatment. Patient will be maintained on high falls precautions. Will monitor mood, safety and also maintain on 15 minute checks.  Patients wife called and this Probation officer spoke with her. Wife reports that he is trying to blame her for "everything" including patient reported fall of last night. She states that she is currently afraid of him and is concerned about him returning home.

## 2015-04-18 NOTE — Progress Notes (Signed)
PT Cancellation Note  Patient Details Name: [name] MRN: [id number] DOB: June 18, 1941   Cancelled Treatment:    Reason Eval/Treat Not Completed:  (see PT note for further details). Pt transferred to [facility] on 7/1, needing new PT re-evaluation. Pt initially eval'd on 6/27 and had worked with PT up until transfer. Last PT notes report ambulation of 220' without AD including stair training (6 steps). Per chart review and discussion with RN staff, pt is not appropriate for therapy re-evaluation this date secondary to mood. Another attempt will be made at later date/time as appropriate.  Greggory Stallion, PT, DPT [phone]    Shontia Gillooly 04/18/2015, 1:30 PM
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
## 2015-04-18 NOTE — Progress Notes (Signed)
Patient is refusing noontime fingerstick, scheduled medications, and lunch. He refused any teaching regarding importance of compliance to maintain health. MD will be made aware.

## 2015-04-18 NOTE — Progress Notes (Signed)
Patient continues to be very condescending and disrespectful to the nursing staff. Making sarcastic comments with inappropriate gestures at times. Says he will only take his medications to "get out of here." Also says he will "keep my mouth shut" because anything he says gets misinterpreted. Nurse attempted to review medication schedule with patient again and importance of taking all medications as ordered by physician. He was told what time his medications were due, and that he would be called by the nurse to come to the medication room. Patient had a sarcastic response to this but was able to repeat back the information. Patient was reminded that these interventions were being done to protect and maintain his health. Patient said he "did not care."

## 2015-04-18 NOTE — Progress Notes (Addendum)
74 year old male transferred to unit on 04/17/15. Per 1st shift nursing staff patient was searched and skin assessment was completed with no contraband was found. Pt was labile at the beginning part of the shift. Did not want to get up and walk to the medication room last night.  Pt reported to author that he fell when he got up to use his urinal. Says that his legs gave out on him. Says he broke his fall. Denies any pain or injury. Denies hitting his head. Fall was not witnessed. Dr. Bary Leriche was notified. No new orders. House supervisor Lelon Frohlich) was also notified. Patient has not had any other complaints. Patient says he made a comment that he might as well kill himself, so he won't have to go through all this, and that is how he ended up on this unit. Has not had prior psych admission. Has extensive medical history. Pt can be very sarcastic, but can have a sense of humor after rapport is established with patient. Has not been aggressive or agitated with staff during the night shift. Patient was educated about risk for falls, and calling for assistance when needed. Staff was notified of patient's report of fall. Was compliant with hs medications. Will cont to monitor per protocol for continuity of care and safety.  09:15 Wife was contacted and voicemail was left. Wife called back and spoke with day-shift nurse. Wife was informed of patient's reported fall. Patient signed release of information form agreeing to give his  Wife information about his care.

## 2015-04-18 NOTE — Progress Notes (Signed)
Pt seclusive to his room. Observed sleeping early in the shift. No visitors. Pt di not go to group. Pt was called to med room for bedtime medication. Pt refused to come states he needed his oxygen he uses at home but then said he took himself off the oxygen so he could go to the beach. Pt in no apparent distress. Vital signs taken . Stable. Pt talking without any shortness of breath. Dr. Bary Leriche aware of pt wanting his oxygen. Will continue to observe. Pt called again to med room. Pt refused bedtime medication and NBS. Pt observed sleeping on 15 minute checks. In no distress at this time.

## 2015-04-18 NOTE — [facility] Group Notes (Signed)
[facility] LCSW Group Therapy  04/18/2015 2:47 PM  Type of Therapy:  Group Therapy  Participation Level:  Did Not Attend  Modes of Intervention:  Discussion, Education, Socialization and Support  Summary of Progress/Problems:Patients were encouraged to define what community means to them. They were encouraged to identify formal and informal support within their community.   [place] MSW, [place]  04/18/2015, 2:47 PM

## 2015-04-18 NOTE — [facility] Group Notes (Signed)
[facility] Group Notes:  (Nursing/MHT/Case Management/Adjunct)  Date:  04/18/2015  Time:  3:29 AM  Type of Therapy:  Group Therapy  Participation Level:  Did Not Attend  [name] 04/18/2015, 3:29 AM

## 2015-04-18 NOTE — Progress Notes (Signed)
   04/18/15 1305  Clinical Encounter Type  Visited With Patient  Visit Type Follow-up  Referral From Nurse  Consult/Referral To Chaplain  Patient said he was doing fine.  He was calm, but appeared annoyed at speaking to me.  He told me he hopes to be released soon.  He also told me to "go find some religious people to talk to, must be some around here." I said that was fine.   I wished him a good day and asked him if there was anything I could do for him while I was there.  He said "tell the nurse to let me go." I told him that I would tell his nurse of his wishes.  I reported his desire to be released to the nurse on duty and left the unit.  [place] [phone]

## 2015-04-18 NOTE — BHH Suicide Risk Assessment (Signed)
[facility] Admission Suicide Risk Assessment   Nursing information obtained from:    Demographic factors:    Current Mental Status:    Loss Factors:    Historical Factors:    Risk Reduction Factors:    Total Time spent with patient: 1 hour Principal Problem: Bipolar I disorder, most recent episode mixed Diagnosis:   Patient Active Problem List   Diagnosis Date Noted  . Bipolar I disorder, most recent episode mixed [F31.60] 04/17/2015  . Bipolar 1 disorder, mixed, moderate [F31.62] 04/16/2015  . Major depressive disorder, recurrent, severe without psychotic features [F33.2]   . Severe major depression without psychotic features [F32.2] 04/14/2015  . COPD exacerbation [J44.1] 04/10/2015  . GI bleed [K92.2] 03/09/2015  . Supplemental oxygen dependent [Z99.81] 11/30/2014  . Iron deficiency anemia due to chronic blood loss [D50.0] 11/25/2014  . Vitamin D deficiency [E55.9] 08/10/2014  . Insomnia [G47.00] 08/10/2014  . Polypharmacy [Z79.899] 08/10/2014  . Chronic pain syndrome [G89.4] 08/10/2014  . GI bleed requiring more than 4 units of blood in 24 hours, ICU, or surgery [K92.2]   . 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 Abdominal pain [R10.9] 12/04/2013  . Leucocytosis [D72.829] 12/04/2013  . Aftercare following surgery of the circulatory system, [place] [Z48.812] 11/04/2013  . 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 Anxiety state [F41.1] 09/10/2013  . 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 Chronic hypoxemic respiratory failure [J96.11] 05/21/2013  . Depression [F32.9] 01/14/2013  . Fatigue [R53.83] 11/06/2012  . Chronic GI bleeding [K92.2] 05/17/2012  . CAD (coronary artery disease) [I25.10] 04/17/2012  . Diastolic HF (heart failure) [I50.30] 03/27/2012  . Anemia [D64.9]   . COPD (chronic obstructive pulmonary disease) [J44.9] 09/13/2011  . Diabetes mellitus type II, controlled [E11.9] 11/11/2010  .  CERUMEN IMPACTION, BILATERAL [H61.20] 11/11/2010  . HYPERLIPIDEMIA [E78.5] 08/18/2009  . Essential hypertension [I10] 08/18/2009  . 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 ALLERGIC RHINITIS [J30.9] 08/18/2009  . AAA (abdominal aortic aneurysm) [I71.4] 12/15/2008     Continued Clinical Symptoms:  Alcohol Use Disorder Identification Test Final Score (AUDIT): 0 The "Alcohol Use Disorders Identification Test", Guidelines for Use in Primary Care, Second Edition.  World Pharmacologist [facility]). Score between 0-7:  no or low risk or alcohol related problems. Score between 8-15:  moderate risk of alcohol related problems. Score between 16-19:  high risk of alcohol related problems. Score 20 or above:  warrants further diagnostic evaluation for alcohol dependence and treatment.   CLINICAL FACTORS:   Bipolar Disorder:   Mixed State   Musculoskeletal: Strength & Muscle Tone: within normal limits Gait & Station: normal Patient leans: N/A  Psychiatric Specialty Exam: Physical Exam  Nursing note and vitals reviewed.   Review of Systems  Respiratory: Positive for cough and shortness of breath.   Genitourinary: Positive for urgency.  All other systems reviewed and are negative.   Blood pressure 120/74, pulse 84, temperature 98.6 F (37 C), temperature source Oral, resp. rate 22, height 6\' 3"  (1.905 m), weight 117.028 kg (258 lb), SpO2 95 %.Body mass index is 32.25 kg/(m^2).  General Appearance: Casual  Eye Contact::  Good  Speech:  Normal Rate  Volume:  Normal  Mood:  Dysphoric  Affect:  Appropriate  Thought Process:  Goal Directed and Linear  Orientation:  Full (Time, Place, and Person)  Thought Content:  Delusions and Paranoid Ideation  Suicidal Thoughts:  Yes.  without intent/plan  Homicidal Thoughts:  Yes.  without intent/plan  Memory:  Immediate;   Fair Recent;   Fair Remote;   Fair  Judgement:  Fair  Insight:  Fair  Psychomotor Activity:  Decreased  Concentration:   Fair  Recall:  [employer] of Knowledge:Fair  Language: Fair  Akathisia:  No  Handed:  Right  AIMS (if indicated):     Assets:  Communication Skills Desire for Improvement Social Support  Sleep:  Number of Hours: 7.5  Cognition: WNL  ADL's:  Intact     COGNITIVE FEATURES THAT CONTRIBUTE TO RISK:  None    SUICIDE RISK:   Moderate:  Frequent suicidal ideation with limited intensity, and duration, some specificity in terms of plans, no associated intent, good self-control, limited dysphoria/symptomatology, some risk factors present, and identifiable protective factors, including available and accessible social support.  PLAN OF CARE: Hospital admission, medication management, discharge planning.  Medical Decision Making:  New problem, with additional work up planned, Review of Psycho-Social Stressors (1), Review or order clinical lab tests (1), Review of Medication Regimen & Side Effects (2) and Review of New Medication or Change in Dosage (2)   Mr. Buckwalter has no past psychiatric history. He was admitted for suicidal and homicidal threats.  1. Suicidal/homicidal ideation. The patient is able to contract for safety.  2. Mood. He was started on depakote at night for mood stabilization and Celexa for depression.   3. Aggressive behavior. He was started on Trazodone tid,  4, Anxiety. He is on low dose xanax.  5. Insomnia. He is on Trazodone and Ambien.  6. GERD. Pantoprazole and Sucralfate.  7. Diabetes. We continue metformin and insulin with BG monitoring.  8. Dyslipidemia. Lipitor.  9. HTN. Lisinopril, metoprolol.  10. COPD. Inhalers. The patient was on home oxygen. He reportedly needs no oxygen now.  11. Deconditioning. PT to evaluate and treat.   12. Constipation. Dulcolax and colace.  13. Guns. There are no guns in the house.  14. Disposition. He will be discharged to home. He will follow up with a new psychiatrist.   8  I certify that inpatient services  furnished can reasonably be expected to improve the patient's condition.   Jolanta Pucilowska 04/18/2015, 12:53 PM

## 2015-04-18 NOTE — BHH Group Notes (Signed)
[place] Group Notes:  (Nursing/MHT/Case Management/Adjunct)  Date:  04/18/2015  Time:  11:51 PM  Type of Therapy:  Group Therapy  Participation Level:  Did Not Attend  [name] 04/18/2015, 11:51 PM

## 2015-04-18 NOTE — Progress Notes (Signed)
   04/18/15 0958  Clinical Encounter Type  Visited With Patient not available  Visit Type Initial  Referral From Nurse  Consult/Referral To Chaplain  Attempted to visit with patient.  Pt was sleeping in his room.  Will attempt to come back later this afternoon.  [place] [phone]

## 2015-04-19 LAB — GLUCOSE, CAPILLARY: GLUCOSE-CAPILLARY: 118 mg/dL — AB (ref 65–99)

## 2015-04-19 MED ORDER — INSULIN ASPART 100 UNIT/ML ~~LOC~~ SOLN: 0.0000 [IU] | Freq: Three times a day (TID) | SUBCUTANEOUS | Status: DC

## 2015-04-19 MED ORDER — BISACODYL 5 MG PO TBEC: 5.0000 mg | DELAYED_RELEASE_TABLET | Freq: Every day | ORAL | Status: DC | PRN

## 2015-04-19 MED ORDER — INSULIN ASPART 100 UNIT/ML ~~LOC~~ SOLN: 0.0000 [IU] | Freq: Every day | SUBCUTANEOUS | Status: DC

## 2015-04-19 MED ORDER — DIVALPROEX SODIUM 500 MG PO DR TAB: 500.0000 mg | DELAYED_RELEASE_TABLET | Freq: Every day | ORAL | Status: DC

## 2015-04-19 MED ORDER — TRAZODONE HCL 50 MG PO TABS: 50.0000 mg | ORAL_TABLET | Freq: Three times a day (TID) | ORAL | Status: DC

## 2015-04-19 NOTE — BHH Suicide Risk Assessment (Signed)
[facility] Discharge Suicide Risk Assessment   Demographic Factors:  Age 74 or older and Caucasian  Total Time spent with patient: 30 minutes  Musculoskeletal: Strength & Muscle Tone: within normal limits Gait & Station: normal Patient leans: N/A  Psychiatric Specialty Exam: Physical Exam  Nursing note and vitals reviewed.   Review of Systems  Respiratory: Positive for cough and shortness of breath.   All other systems reviewed and are negative.   Blood pressure 128/76, pulse 106, temperature 97.6 F (36.4 C), temperature source Oral, resp. rate 20, height 6\' 3"  (1.905 m), weight 117.028 kg (258 lb), SpO2 95 %.Body mass index is 32.25 kg/(m^2).  General Appearance: Disheveled  Eye Contact::  Good  Speech:  Clear and [id number]  Volume:  Normal  Mood:  Dysphoric  Affect:  Appropriate  Thought Process:  Goal Directed and Linear  Orientation:  Full (Time, Place, and Person)  Thought Content:  WDL  Suicidal Thoughts:  No  Homicidal Thoughts:  No  Memory:  Immediate;   Fair Recent;   Fair Remote;   Fair  Judgement:  Fair  Insight:  Fair  Psychomotor Activity:  Decreased  Concentration:  Fair  Recall:  [employer] of Knowledge:Fair  Language: Fair  Akathisia:  No  Handed:  Right  AIMS (if indicated):     Assets:  Communication Skills Desire for Improvement Financial Resources/Insurance Housing Social Support  Sleep:  Number of Hours: 6.25  Cognition: WNL  ADL's:  Intact   Have you used any form of tobacco in the last 30 days? (Cigarettes, Smokeless Tobacco, Cigars, and/or Pipes): No  Has this patient used any form of tobacco in the last 30 days? (Cigarettes, Smokeless Tobacco, Cigars, and/or Pipes) No  Mental Status Per Nursing Assessment::   On Admission:     Current Mental Status by Physician: NA  Loss Factors: Decline in physical health and Financial problems/change in socioeconomic status  Historical Factors: Impulsivity  Risk Reduction Factors:   Living  with another person, especially a relative and Positive social support  Continued Clinical Symptoms:  Bipolar Disorder:   Mixed State  Cognitive Features That Contribute To Risk:  None    Suicide Risk:  Minimal: No identifiable suicidal ideation.  Patients presenting with no risk factors but with morbid ruminations; may be classified as minimal risk based on the severity of the depressive symptoms  Principal Problem: Bipolar I disorder, most recent episode mixed Discharge Diagnoses:  Patient Active Problem List   Diagnosis Date Noted  . Bipolar I disorder, most recent episode mixed [F31.60] 04/17/2015  . Bipolar 1 disorder, mixed, moderate [F31.62] 04/16/2015  . Major depressive disorder, recurrent, severe without psychotic features [F33.2]   . Severe major depression without psychotic features [F32.2] 04/14/2015  . COPD exacerbation [J44.1] 04/10/2015  . GI bleed [K92.2] 03/09/2015  . Supplemental oxygen dependent [Z99.81] 11/30/2014  . Iron deficiency anemia due to chronic blood loss [D50.0] 11/25/2014  . Vitamin D deficiency [E55.9] 08/10/2014  . Insomnia [G47.00] 08/10/2014  . Polypharmacy [Z79.899] 08/10/2014  . Chronic pain syndrome [G89.4] 08/10/2014  . GI bleed requiring more than 4 units of blood in 24 hours, ICU, or surgery [K92.2]   . 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 Abdominal pain [R10.9] 12/04/2013  . Leucocytosis [D72.829] 12/04/2013  . Aftercare following surgery of the circulatory system, [place] [Z48.812] 11/04/2013  . 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 Anxiety state [F41.1] 09/10/2013  . AVM (arteriovenous malformation) of stomach, acquired with hemorrhage [  K31.811] 08/21/2013  . Chronic hypoxemic respiratory failure [J96.11] 05/21/2013  . Depression [F32.9] 01/14/2013  . Fatigue [R53.83] 11/06/2012  . Chronic GI bleeding [K92.2] 05/17/2012  . CAD (coronary artery disease) [I25.10] 04/17/2012  . Diastolic HF (heart  failure) [I50.30] 03/27/2012  . Anemia [D64.9]   . COPD (chronic obstructive pulmonary disease) [J44.9] 09/13/2011  . Diabetes mellitus type II, controlled [E11.9] 11/11/2010  . CERUMEN IMPACTION, BILATERAL [H61.20] 11/11/2010  . HYPERLIPIDEMIA [E78.5] 08/18/2009  . Essential hypertension [I10] 08/18/2009  . 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 ALLERGIC RHINITIS [J30.9] 08/18/2009  . AAA (abdominal aortic aneurysm) [I71.4] 12/15/2008    Follow-up Information    Follow up with [facility].   Specialty:  Behavioral Health   Why:  If appointment at [facility] is past 30 days, please go to the walk-in clinic. Monday- Friday 8:00am -10:00am.    Contact information:   [place] [address] [phone]       Schedule an appointment as soon as possible for a visit with [facility] .   Why:  As soon as possible, call to make a hospital follow up appointment.    Contact information:   [place] [phone]      Plan Of Care/Follow-up recommendations:  Activity:  As tolerated Diet:  Low sodium heart healthy. Other:  Keep follow-up appointments.  Is patient on multiple antipsychotic therapies at discharge:  No   Has Patient had three or more failed trials of antipsychotic monotherapy by history:  No  Recommended Plan for Multiple Antipsychotic Therapies: NA    [name] 04/19/2015, 12:21 PM

## 2015-04-19 NOTE — BHH Suicide Risk Assessment (Signed)
[place] INPATIENT:  Family/Significant Other Suicide Prevention Education  Suicide Prevention Education:  Education Completed; [name] [phone]) has been identified by the patient as the family member/significant other with whom the patient will be residing, and identified as the person(s) who will aid the patient in the event of a mental health crisis (suicidal ideations/suicide attempt).  With written consent from the patient, the family member/significant other has been provided the following suicide prevention education, prior to the and/or following the discharge of the patient.  The suicide prevention education provided includes the following:  Suicide risk factors  Suicide prevention and interventions  National Suicide Hotline telephone number  [facility] assessment telephone number  [facility] Emergency Assistance [place] and/or Residential Mobile Crisis Unit telephone number  Request made of family/significant other to:  Remove weapons (e.g., guns, rifles, knives), all items previously/currently identified as safety concern.    Remove drugs/medications (over-the-counter, prescriptions, illicit drugs), all items previously/currently identified as a safety concern.  The family member/significant other verbalizes understanding of the suicide prevention education information provided.  The family member/significant other agrees to remove the items of safety concern listed above. Wife states [name] does not own any gun and no way to purchase a gun.   [employer] MSW, [place]  04/19/2015, 12:48 PM

## 2015-04-19 NOTE — BHH Counselor (Signed)
CSW was unable to complete assessment. Pt was discharged within 48 hours. Pt plans to return home and follow up with outpatient.   [place] MSW, [place]  04/19/2015 12:07 PM

## 2015-04-19 NOTE — Progress Notes (Signed)
Patient refused meds & meals today.Uncooperative with the the treatment.Denies suicidal & homicidal ideation.DC instructions explained to patient,verbelized understanding.Priscriptions given to patient.Personel belongings returned.Left unit with staff.

## 2015-04-19 NOTE — Evaluation (Signed)
Physical Therapy Evaluation Patient Details Name: [name] MRN: [id number] DOB: 12-20-1940 Today's Date: 04/19/2015   History of Present Illness  74 yo male with onset of GI bleed and CHF with leukocytosis, respiratory failure is readmitted with chronic issues. PMHx: AAA, CHF, CAD, DM, GO bleed; While in hospital patient became agressive and made threats to himself and others. He received a psych consult and was diagnosed with bipolar disorder and transferred to behavior med  Clinical Impression  74 yo Male was admitted to hospital with chronic medical issues including CHF and shortness of breath. While in hospital, patient became aggressive and was making threats against himself and others. He was evaluated by psych and was diagnosed with bipolar disorder. Patient was transferred to behavioral med floor. He currently is independent in bed mobility and transfers. He is independent in gait without AD for short distances being limited to <100 feet due to shortness of breath. Patient reports this is his baseline and is not interested in being able to walk farther. He expressed desire to go home and sleep and is not interested in his mobility needs. Patient declined additional physical therapy and is not appropriate for skilled PT as he is at baseline and is not motivated to improve. No additional skilled PT intervention indicated at this time.    Follow Up Recommendations No PT follow up    Equipment Recommendations  None recommended by PT    Recommendations for Other Services       Precautions / Restrictions Restrictions Weight Bearing Restrictions: No      Mobility  Bed Mobility Overal bed mobility: Independent             General bed mobility comments: rolls and sits on edge of bed without bedrails, independently;  Transfers Overall transfer level: Independent Equipment used: None Transfers: Sit to/from Stand Sit to Stand: Independent         General transfer  comment: able to push up from low bed and stand independently;   Ambulation/Gait Ambulation/Gait assistance: Independent Ambulation Distance (Feet): 40 Feet Assistive device: None Gait Pattern/deviations: Step-through pattern;Decreased step length - right;Decreased step length - left;Decreased dorsiflexion - right;Decreased dorsiflexion - left;Wide base of support Gait velocity: reduced   General Gait Details: demonstrates wide base of support and decreased step length with decreased foot clearance; reports this is his walking ability at baseline prior to admittance;  Stairs            Wheelchair Mobility    Modified Rankin (Stroke Patients Only)       Balance Overall balance assessment: Independent Sitting-balance support: No upper extremity supported Sitting balance-Leahy Scale: Normal     Standing balance support: No upper extremity supported Standing balance-Leahy Scale: Good                               Pertinent Vitals/Pain Pain Location: reports pain all over but unable to provide pain number and unable to describe pain; overall agitated    Home Living Family/patient expects to be discharged to:: Private residence Living Arrangements: Spouse/significant other Available Help at Discharge: Family Type of Home: House Home Access: Stairs to enter   Technical brewer of Steps: 2 Home Layout: One level Home Equipment: Environmental consultant - 2 wheels;Shower seat      Prior Function Level of Independence: Independent with assistive device(s)         Comments: reports that he stopped using the walker at  home;      Hand Dominance        Extremity/Trunk Assessment   Upper Extremity Assessment: Overall WFL for tasks assessed           Lower Extremity Assessment: Overall WFL for tasks assessed (gross strength 4/5)      Cervical / Trunk Assessment: Normal  Communication   Communication: HOH;Receptive difficulties;Other (comment) (easily  agitated)  Cognition Arousal/Alertness: Lethargic Behavior During Therapy: WFL for tasks assessed/performed Overall Cognitive Status: Within Functional Limits for tasks assessed                      General Comments General comments (skin integrity, edema, etc.): patient overall appears depressed and frustrated. He states, "I just want to go home and die. I want to ly in my bed and go to sleep and never get up." He reports not needing to walk far in home and is not interested in additional therapy at this time.     Exercises        Assessment/Plan    PT Assessment [place] does not need any further PT services  PT Diagnosis Difficulty walking   PT Problem List Pain;Cardiopulmonary status limiting activity;Decreased mobility;Decreased safety awareness  PT Treatment Interventions     PT Goals (Current goals can be found in the Care Plan section) Acute Rehab PT Goals Patient Stated Goal: "I just want to go home." PT Goal Formulation: With patient Time For Goal Achievement: 04/19/15 Potential to Achieve Goals: Good    Frequency     Barriers to discharge Decreased caregiver support pt concerned about wife not being able to help him when he goes home.    Co-evaluation               End of Session   Activity Tolerance: Patient limited by fatigue;Treatment limited secondary to agitation (pt short of breath after walking 40 feet;) Patient left: in bed Nurse Communication: Mobility status    Functional Assessment Tool Used: clinical judgement Functional Limitation: Mobility: Walking and moving around Mobility: Walking and Moving Around Current Status ([id number]): At least 1 percent but less than 20 percent impaired, limited or restricted Mobility: Walking and Moving Around Goal Status [phone]): At least 1 percent but less than 20 percent impaired, limited or restricted Mobility: Walking and Moving Around Discharge Status [phone]): At least 1 percent but less than 20 percent  impaired, limited or restricted    Time: 1230-1242 PT Time Calculation (min) (ACUTE ONLY): 12 min   Charges:   PT Evaluation $PT Re-evaluation: 1 Procedure     PT G Codes:   PT G-Codes **NOT FOR INPATIENT CLASS** Functional Assessment Tool Used: clinical judgement Functional Limitation: Mobility: Walking and moving around Mobility: Walking and Moving Around Current Status ([id number]): At least 1 percent but less than 20 percent impaired, limited or restricted Mobility: Walking and Moving Around Goal Status [phone]): At least 1 percent but less than 20 percent impaired, limited or restricted Mobility: Walking and Moving Around Discharge Status [phone]): At least 1 percent but less than 20 percent impaired, limited or restricted    Hopkins,Brennyn Ortlieb 04/19/2015, 12:56 PM

## 2015-04-19 NOTE — Progress Notes (Signed)
Pt reported to tech rounding this am at 0710 that he had fallen during the night twice. Pt appears in no distress. VSS. States he got up to go to the bathroom and fell.  Urinal at bedside was full. No disturbance in things placed at bedside on floor. Supervisor noted, Dr. Bary Leriche notified. Pt discussed his concerns over being homeless. Wife did not visited. He feels he is hopeless and has no where to go if she divorces him.

## 2015-04-19 NOTE — Progress Notes (Signed)
  [facility] Adult Case Management Discharge Plan :  Will you be returning to the same living situation after discharge:  Yes,  Home  At discharge, do you have transportation home?: Yes,  Wife Do you have the ability to pay for your medications: Yes,  Insurance   Release of information consent forms completed and in the chart;  Patient's signature needed at discharge.  Patient to Follow up at: Follow-up Information    Follow up with [facility].   Specialty:  Behavioral Health   Why:  If appointment at [facility] is past 30 days, please go to the walk-in clinic. Monday- Friday 8:00am -10:00am.    Contact information:   [place] [address] [phone]       Schedule an appointment as soon as possible for a visit with [facility] .   Why:  As soon as possible, call to make a hospital follow up appointment.    Contact information:   [place] [phone]      Patient denies SI/HI: Yes,  Yes     Safety Planning and Suicide Prevention discussed: Yes,  With wife and patient   Have you used any form of tobacco in the last 30 days? (Cigarettes, Smokeless Tobacco, Cigars, and/or Pipes): No  Has patient been referred to the Quitline?: N/A patient is not a smoker  [place] MSW, [place]  04/19/2015, 12:05 PM
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
## 2015-04-19 NOTE — Consult Note (Signed)
Writer attempted to get authorization inpatient for treatment. However, her insurance provider ([employer]-[phone]) was closed. The automatic message, advised to call back during regular business hours. Writer attempted to connect to their emergency line, but it advised to to go to the Local ER or call 911.

## 2015-04-21 ENCOUNTER — Telehealth: Payer: Self-pay | Admitting: Emergency Medicine

## 2015-04-21 NOTE — Progress Notes (Signed)
[facility] H&P Discharge Summary faxed to [facility] for hospital follow-up °

## 2015-04-21 NOTE — ED Notes (Signed)
Called pt at request of L mclamb.  She took message earlier in day.  Wife says patient has had some black stool today, and patient is concerned because he gets anemic.  i explained that there is no way to determine his hgb iwthout drawing his blood.  He says he feels okay.  He says he has no pcp because he has been "fired" by all his doctors.  I explained that he may return here, but that he would make decision as to when.  He said he may return.   Wife was on the line, and i gave her the [facility] line to call to assist her in getting him pcp.

## 2015-04-22 ENCOUNTER — Inpatient Hospital Stay
Admission: EM | Admit: 2015-04-22 | Discharge: 2015-04-27 | DRG: 299 | Disposition: A | Discharge status: Home / Self Care | Payer: Commercial Managed Care - HMO | Attending: Internal Medicine | Admitting: Internal Medicine

## 2015-04-22 ENCOUNTER — Encounter: Payer: Self-pay | Admitting: Occupational Medicine

## 2015-04-22 ENCOUNTER — Inpatient Hospital Stay: Payer: Commercial Managed Care - HMO | Admitting: Anesthesiology

## 2015-04-22 ENCOUNTER — Emergency Department: Payer: Commercial Managed Care - HMO

## 2015-04-22 ENCOUNTER — Inpatient Hospital Stay: Payer: Commercial Managed Care - HMO

## 2015-04-22 ENCOUNTER — Encounter
Admission: EM | Disposition: A | Discharge status: Home / Self Care | Payer: Self-pay | Source: Home / Self Care | Attending: Internal Medicine

## 2015-04-22 DIAGNOSIS — Z7951 Long term (current) use of inhaled steroids: Secondary | ICD-10-CM

## 2015-04-22 DIAGNOSIS — Q2733 Arteriovenous malformation of digestive system vessel: Secondary | ICD-10-CM | POA: Diagnosis not present

## 2015-04-22 DIAGNOSIS — E785 Hyperlipidemia, unspecified: Secondary | ICD-10-CM | POA: Diagnosis present

## 2015-04-22 DIAGNOSIS — J449 Chronic obstructive pulmonary disease, unspecified: Secondary | ICD-10-CM | POA: Diagnosis present

## 2015-04-22 DIAGNOSIS — I739 Peripheral vascular disease, unspecified: Secondary | ICD-10-CM | POA: Diagnosis present

## 2015-04-22 DIAGNOSIS — Z885 Allergy status to narcotic agent status: Secondary | ICD-10-CM

## 2015-04-22 DIAGNOSIS — R413 Other amnesia: Secondary | ICD-10-CM | POA: Diagnosis present

## 2015-04-22 DIAGNOSIS — Z9981 Dependence on supplemental oxygen: Secondary | ICD-10-CM

## 2015-04-22 DIAGNOSIS — Z880 Allergy status to penicillin: Secondary | ICD-10-CM | POA: Diagnosis not present

## 2015-04-22 DIAGNOSIS — I10 Essential (primary) hypertension: Secondary | ICD-10-CM | POA: Diagnosis present

## 2015-04-22 DIAGNOSIS — I509 Heart failure, unspecified: Secondary | ICD-10-CM | POA: Diagnosis present

## 2015-04-22 DIAGNOSIS — K922 Gastrointestinal hemorrhage, unspecified: Secondary | ICD-10-CM | POA: Diagnosis present

## 2015-04-22 DIAGNOSIS — K449 Diaphragmatic hernia without obstruction or gangrene: Secondary | ICD-10-CM | POA: Diagnosis present

## 2015-04-22 DIAGNOSIS — R195 Other fecal abnormalities: Secondary | ICD-10-CM | POA: Diagnosis not present

## 2015-04-22 DIAGNOSIS — J9610 Chronic respiratory failure, unspecified whether with hypoxia or hypercapnia: Secondary | ICD-10-CM | POA: Diagnosis present

## 2015-04-22 DIAGNOSIS — K31811 Angiodysplasia of stomach and duodenum with bleeding: Secondary | ICD-10-CM | POA: Diagnosis present

## 2015-04-22 DIAGNOSIS — D62 Acute posthemorrhagic anemia: Secondary | ICD-10-CM | POA: Diagnosis present

## 2015-04-22 DIAGNOSIS — K921 Melena: Secondary | ICD-10-CM

## 2015-04-22 DIAGNOSIS — E119 Type 2 diabetes mellitus without complications: Secondary | ICD-10-CM | POA: Diagnosis present

## 2015-04-22 DIAGNOSIS — Z794 Long term (current) use of insulin: Secondary | ICD-10-CM

## 2015-04-22 DIAGNOSIS — Z951 Presence of aortocoronary bypass graft: Secondary | ICD-10-CM | POA: Diagnosis not present

## 2015-04-22 DIAGNOSIS — Z9109 Other allergy status, other than to drugs and biological substances: Secondary | ICD-10-CM

## 2015-04-22 DIAGNOSIS — F3177 Bipolar disorder, in partial remission, most recent episode mixed: Secondary | ICD-10-CM | POA: Diagnosis not present

## 2015-04-22 DIAGNOSIS — I252 Old myocardial infarction: Secondary | ICD-10-CM | POA: Diagnosis not present

## 2015-04-22 DIAGNOSIS — Z79899 Other long term (current) drug therapy: Secondary | ICD-10-CM

## 2015-04-22 DIAGNOSIS — E1165 Type 2 diabetes mellitus with hyperglycemia: Secondary | ICD-10-CM | POA: Diagnosis present

## 2015-04-22 DIAGNOSIS — I2510 Atherosclerotic heart disease of native coronary artery without angina pectoris: Secondary | ICD-10-CM | POA: Diagnosis present

## 2015-04-22 DIAGNOSIS — K31819 Angiodysplasia of stomach and duodenum without bleeding: Secondary | ICD-10-CM | POA: Insufficient documentation

## 2015-04-22 DIAGNOSIS — R059 Cough, unspecified: Secondary | ICD-10-CM

## 2015-04-22 DIAGNOSIS — R05 Cough: Secondary | ICD-10-CM

## 2015-04-22 HISTORY — DX: Angiodysplasia of colon with hemorrhage: K55.21

## 2015-04-22 HISTORY — DX: Polyp of stomach and duodenum: K31.7

## 2015-04-22 HISTORY — DX: Diverticulosis of intestine, part unspecified, without perforation or abscess without bleeding: K57.90

## 2015-04-22 HISTORY — PX: ESOPHAGOGASTRODUODENOSCOPY (EGD) WITH PROPOFOL: SHX5813

## 2015-04-22 LAB — TSH: TSH: 2.605 u[IU]/mL (ref 0.350–4.500)

## 2015-04-22 LAB — GLUCOSE, CAPILLARY
Glucose-Capillary: 406 mg/dL — ABNORMAL HIGH (ref 65–99)
Glucose-Capillary: 415 mg/dL — ABNORMAL HIGH (ref 65–99)
Glucose-Capillary: 333 mg/dL — ABNORMAL HIGH (ref 65–99)
Glucose-Capillary: 342 mg/dL — ABNORMAL HIGH (ref 65–99)
Glucose-Capillary: 282 mg/dL — ABNORMAL HIGH (ref 65–99)
Glucose-Capillary: 360 mg/dL — ABNORMAL HIGH (ref 65–99)

## 2015-04-22 LAB — COMPREHENSIVE METABOLIC PANEL
ALT: 13 U/L — AB (ref 17–63)
AST: 19 U/L (ref 15–41)
Albumin: 2.8 g/dL — ABNORMAL LOW (ref 3.5–5.0)
Alkaline Phosphatase: 79 U/L (ref 38–126)
Anion gap: 9 (ref 5–15)
BUN: 34 mg/dL — ABNORMAL HIGH (ref 6–20)
CALCIUM: 8 mg/dL — AB (ref 8.9–10.3)
CO2: 24 mmol/L (ref 22–32)
Chloride: 105 mmol/L (ref 101–111)
Creatinine, Ser: 1.11 mg/dL (ref 0.61–1.24)
GFR calc Af Amer: >60 mL/min (ref >60)
GLUCOSE: 326 mg/dL — AB (ref 65–99)
POTASSIUM: 4.6 mmol/L (ref 3.5–5.1)
SODIUM: 138 mmol/L (ref 135–145)
Total Bilirubin: 0.3 mg/dL (ref 0.3–1.2)
Total Protein: 5.2 g/dL — ABNORMAL LOW (ref 6.5–8.1)

## 2015-04-22 LAB — HEMOGLOBIN A1C: Hgb A1c MFr Bld: 6.9 % — ABNORMAL HIGH (ref 4.0–6.0)

## 2015-04-22 LAB — CBC
HCT: 21.2 % — ABNORMAL LOW (ref 40.0–52.0)
Hemoglobin: 6.2 g/dL — ABNORMAL LOW (ref 13.0–18.0)
MCH: 24.8 pg — ABNORMAL LOW (ref 26.0–34.0)
MCHC: 29.1 g/dL — ABNORMAL LOW (ref 32.0–36.0)
MCV: 85.3 fL (ref 80.0–100.0)
PLATELETS: 208 10*3/uL (ref 150–440)
RBC: 2.48 MIL/uL — AB (ref 4.40–5.90)
RDW: 17.9 % — AB (ref 11.5–14.5)
WBC: 23.4 10*3/uL — AB (ref 3.8–10.6)

## 2015-04-22 LAB — HEMOGLOBIN AND HEMATOCRIT, BLOOD
HCT: 24.9 % — ABNORMAL LOW (ref 40.0–52.0)
HEMOGLOBIN: 7.5 g/dL — AB (ref 13.0–18.0)

## 2015-04-22 LAB — PREPARE RBC (CROSSMATCH)

## 2015-04-22 LAB — TROPONIN I: Troponin I: 0.03 ng/mL (ref <0.031)

## 2015-04-22 LAB — MAGNESIUM: Magnesium: 2.1 mg/dL (ref 1.7–2.4)

## 2015-04-22 LAB — CK: CK TOTAL: 27 U/L — AB (ref 49–397)

## 2015-04-22 SURGERY — ESOPHAGOGASTRODUODENOSCOPY (EGD) WITH PROPOFOL
Anesthesia: General

## 2015-04-22 MED ORDER — CITALOPRAM HYDROBROMIDE 20 MG PO TABS
40.0000 mg | ORAL_TABLET | Freq: Every day | ORAL | Status: DC
  Administered 2015-04-23 – 2015-04-27 (×5): 40 mg via ORAL
  Filled 2015-04-22 (×5): qty 2

## 2015-04-22 MED ORDER — ZOLPIDEM TARTRATE 5 MG PO TABS
5.0000 mg | ORAL_TABLET | Freq: Every evening | ORAL | Status: DC | PRN
  Administered 2015-04-22 – 2015-04-26 (×5): 5 mg via ORAL
  Filled 2015-04-22 (×5): qty 1

## 2015-04-22 MED ORDER — SODIUM CHLORIDE 0.9 % IV SOLN: 80.0000 mg | Freq: Once | INTRAVENOUS | Status: DC

## 2015-04-22 MED ORDER — MORPHINE SULFATE 2 MG/ML IJ SOLN
2.0000 mg | INTRAMUSCULAR | Status: DC | PRN
  Filled 2015-04-22: qty 1

## 2015-04-22 MED ORDER — BISACODYL 5 MG PO TBEC: 5.0000 mg | DELAYED_RELEASE_TABLET | Freq: Every day | ORAL | Status: DC | PRN

## 2015-04-22 MED ORDER — TRAZODONE HCL 50 MG PO TABS
50.0000 mg | ORAL_TABLET | Freq: Three times a day (TID) | ORAL | Status: DC
  Administered 2015-04-22 – 2015-04-27 (×16): 50 mg via ORAL
  Filled 2015-04-22 (×17): qty 1

## 2015-04-22 MED ORDER — INSULIN ASPART 100 UNIT/ML ~~LOC~~ SOLN: 0.0000 [IU] | Freq: Three times a day (TID) | SUBCUTANEOUS | Status: DC

## 2015-04-22 MED ORDER — IOHEXOL 300 MG/ML  SOLN
125.0000 mL | Freq: Once | INTRAMUSCULAR | Status: AC | PRN
  Administered 2015-04-22: 125 mL via INTRAVENOUS

## 2015-04-22 MED ORDER — SODIUM CHLORIDE 0.9 % IV SOLN
10.0000 mL/h | Freq: Once | INTRAVENOUS | Status: AC
  Administered 2015-04-22: 13:00:00 via INTRAVENOUS

## 2015-04-22 MED ORDER — SODIUM CHLORIDE 0.9 % IV SOLN
8.0000 mg/h | INTRAVENOUS | Status: DC
  Filled 2015-04-22: qty 80

## 2015-04-22 MED ORDER — SUCRALFATE 1 G PO TABS
1.0000 g | ORAL_TABLET | Freq: Three times a day (TID) | ORAL | Status: DC
  Administered 2015-04-22 – 2015-04-27 (×20): 1 g via ORAL
  Filled 2015-04-22 (×20): qty 1

## 2015-04-22 MED ORDER — ONDANSETRON HCL 4 MG PO TABS: 4.0000 mg | ORAL_TABLET | Freq: Four times a day (QID) | ORAL | Status: DC | PRN

## 2015-04-22 MED ORDER — LIDOCAINE HCL (CARDIAC) 20 MG/ML IV SOLN
INTRAVENOUS | Status: DC | PRN
  Administered 2015-04-22: 100 mg via INTRAVENOUS

## 2015-04-22 MED ORDER — SODIUM CHLORIDE 0.9 % IV SOLN
INTRAVENOUS | Status: DC
  Administered 2015-04-22: 12:00:00 via INTRAVENOUS

## 2015-04-22 MED ORDER — INSULIN ASPART 100 UNIT/ML ~~LOC~~ SOLN
12.0000 [IU] | Freq: Once | SUBCUTANEOUS | Status: AC
  Administered 2015-04-22: 12 [IU] via SUBCUTANEOUS
  Filled 2015-04-22: qty 12

## 2015-04-22 MED ORDER — PROPOFOL INFUSION 10 MG/ML OPTIME
INTRAVENOUS | Status: DC | PRN
  Administered 2015-04-22: 100 ug/kg/min via INTRAVENOUS

## 2015-04-22 MED ORDER — INSULIN ASPART 100 UNIT/ML ~~LOC~~ SOLN
0.0000 [IU] | Freq: Three times a day (TID) | SUBCUTANEOUS | Status: DC
  Administered 2015-04-22: 11 [IU] via SUBCUTANEOUS
  Administered 2015-04-22 – 2015-04-23 (×2): 8 [IU] via SUBCUTANEOUS
  Administered 2015-04-23: 15 [IU] via SUBCUTANEOUS
  Administered 2015-04-23: 11 [IU] via SUBCUTANEOUS
  Administered 2015-04-24 (×2): 15 [IU] via SUBCUTANEOUS
  Filled 2015-04-22: qty 8
  Filled 2015-04-22: qty 11
  Filled 2015-04-22: qty 8
  Filled 2015-04-22: qty 11
  Filled 2015-04-22: qty 15
  Filled 2015-04-22: qty 11
  Filled 2015-04-22 (×2): qty 15
  Filled 2015-04-22: qty 5

## 2015-04-22 MED ORDER — MIDAZOLAM HCL 5 MG/5ML IJ SOLN
INTRAMUSCULAR | Status: DC | PRN
  Administered 2015-04-22: 1 mg via INTRAVENOUS

## 2015-04-22 MED ORDER — ACETAMINOPHEN 325 MG PO TABS
650.0000 mg | ORAL_TABLET | Freq: Four times a day (QID) | ORAL | Status: DC | PRN
  Administered 2015-04-23 – 2015-04-26 (×5): 650 mg via ORAL
  Filled 2015-04-22 (×5): qty 2

## 2015-04-22 MED ORDER — ATORVASTATIN CALCIUM 20 MG PO TABS
40.0000 mg | ORAL_TABLET | Freq: Every day | ORAL | Status: DC
  Administered 2015-04-22 – 2015-04-26 (×5): 40 mg via ORAL
  Filled 2015-04-22 (×5): qty 2

## 2015-04-22 MED ORDER — SODIUM CHLORIDE 0.9 % IJ SOLN
3.0000 mL | Freq: Two times a day (BID) | INTRAMUSCULAR | Status: DC
  Administered 2015-04-22 – 2015-04-27 (×11): 3 mL via INTRAVENOUS

## 2015-04-22 MED ORDER — ACETAMINOPHEN 650 MG RE SUPP: 650.0000 mg | Freq: Four times a day (QID) | RECTAL | Status: DC | PRN

## 2015-04-22 MED ORDER — IOHEXOL 240 MG/ML SOLN
25.0000 mL | Freq: Once | INTRAMUSCULAR | Status: AC | PRN
  Administered 2015-04-22: 25 mL via ORAL

## 2015-04-22 MED ORDER — ALPRAZOLAM 0.5 MG PO TABS
0.5000 mg | ORAL_TABLET | Freq: Three times a day (TID) | ORAL | Status: DC | PRN
  Administered 2015-04-22 – 2015-04-27 (×9): 0.5 mg via ORAL
  Filled 2015-04-22 (×9): qty 1

## 2015-04-22 MED ORDER — METFORMIN HCL 500 MG PO TABS: 500.0000 mg | ORAL_TABLET | Freq: Every day | ORAL | Status: DC

## 2015-04-22 MED ORDER — VITAMIN D (ERGOCALCIFEROL) 1.25 MG (50000 UNIT) PO CAPS
50000.0000 [IU] | ORAL_CAPSULE | ORAL | Status: DC
  Filled 2015-04-22: qty 1

## 2015-04-22 MED ORDER — ALBUTEROL SULFATE (2.5 MG/3ML) 0.083% IN NEBU: 3.0000 mL | INHALATION_SOLUTION | Freq: Four times a day (QID) | RESPIRATORY_TRACT | Status: DC | PRN

## 2015-04-22 MED ORDER — BUDESONIDE-FORMOTEROL FUMARATE 160-4.5 MCG/ACT IN AERO
2.0000 | INHALATION_SPRAY | Freq: Two times a day (BID) | RESPIRATORY_TRACT | Status: DC
  Administered 2015-04-22 – 2015-04-27 (×9): 2 via RESPIRATORY_TRACT
  Filled 2015-04-22 (×2): qty 6

## 2015-04-22 MED ORDER — TIOTROPIUM BROMIDE MONOHYDRATE 18 MCG IN CAPS
18.0000 ug | ORAL_CAPSULE | Freq: Every day | RESPIRATORY_TRACT | Status: DC
  Administered 2015-04-23 – 2015-04-27 (×5): 18 ug via RESPIRATORY_TRACT
  Filled 2015-04-22: qty 5

## 2015-04-22 MED ORDER — INSULIN GLARGINE 100 UNIT/ML ~~LOC~~ SOLN
10.0000 [IU] | Freq: Every day | SUBCUTANEOUS | Status: DC
  Administered 2015-04-22: 10 [IU] via SUBCUTANEOUS
  Filled 2015-04-22 (×3): qty 0.1

## 2015-04-22 MED ORDER — SODIUM CHLORIDE 0.9 % IV SOLN
80.0000 mg | Freq: Once | INTRAVENOUS | Status: AC
  Administered 2015-04-22: 80 mg via INTRAVENOUS
  Filled 2015-04-22: qty 80

## 2015-04-22 MED ORDER — PANTOPRAZOLE SODIUM 40 MG PO TBEC
40.0000 mg | DELAYED_RELEASE_TABLET | Freq: Two times a day (BID) | ORAL | Status: DC
Start: 2015-04-22 — End: 2015-04-22

## 2015-04-22 MED ORDER — DOCUSATE SODIUM 100 MG PO CAPS
100.0000 mg | ORAL_CAPSULE | Freq: Two times a day (BID) | ORAL | Status: DC
  Administered 2015-04-22 – 2015-04-27 (×10): 100 mg via ORAL
  Filled 2015-04-22 (×11): qty 1

## 2015-04-22 MED ORDER — SODIUM CHLORIDE 0.9 % IV SOLN
8.0000 mg/h | INTRAVENOUS | Status: AC
  Administered 2015-04-22 – 2015-04-24 (×4): 8 mg/h via INTRAVENOUS
  Filled 2015-04-22 (×5): qty 80

## 2015-04-22 MED ORDER — METOPROLOL TARTRATE 25 MG PO TABS
25.0000 mg | ORAL_TABLET | Freq: Two times a day (BID) | ORAL | Status: DC
  Administered 2015-04-22 – 2015-04-27 (×10): 25 mg via ORAL
  Filled 2015-04-22 (×10): qty 1

## 2015-04-22 MED ORDER — FENTANYL CITRATE (PF) 100 MCG/2ML IJ SOLN
INTRAMUSCULAR | Status: DC | PRN
  Administered 2015-04-22: 50 ug via INTRAVENOUS

## 2015-04-22 MED ORDER — FOLIC ACID 1 MG PO TABS
2.0000 mg | ORAL_TABLET | Freq: Every morning | ORAL | Status: DC
  Administered 2015-04-23 – 2015-04-27 (×5): 2 mg via ORAL
  Filled 2015-04-22 (×5): qty 2

## 2015-04-22 MED ORDER — PROPOFOL 10 MG/ML IV BOLUS
INTRAVENOUS | Status: DC | PRN
  Administered 2015-04-22: 40 mg via INTRAVENOUS

## 2015-04-22 MED ORDER — DIVALPROEX SODIUM 500 MG PO DR TAB
500.0000 mg | DELAYED_RELEASE_TABLET | Freq: Every day | ORAL | Status: DC
  Administered 2015-04-22 – 2015-04-26 (×5): 500 mg via ORAL
  Filled 2015-04-22 (×7): qty 1

## 2015-04-22 MED ORDER — ONDANSETRON HCL 4 MG/2ML IJ SOLN: 4.0000 mg | Freq: Four times a day (QID) | INTRAMUSCULAR | Status: DC | PRN

## 2015-04-22 MED ORDER — INSULIN GLARGINE 100 UNIT/ML ~~LOC~~ SOLN
6.0000 [IU] | Freq: Once | SUBCUTANEOUS | Status: AC
  Administered 2015-04-22: 6 [IU] via SUBCUTANEOUS
  Filled 2015-04-22: qty 0.06

## 2015-04-22 MED ORDER — LISINOPRIL 5 MG PO TABS
5.0000 mg | ORAL_TABLET | Freq: Every day | ORAL | Status: DC
  Administered 2015-04-24 – 2015-04-27 (×4): 5 mg via ORAL
  Filled 2015-04-22 (×5): qty 1

## 2015-04-22 NOTE — Progress Notes (Signed)
Pt very anxious, agitated because he can not get his wife on the phone.  Requesting Xanax 0.5mg  po, given, reassured pt.

## 2015-04-22 NOTE — Anesthesia Postprocedure Evaluation (Signed)
  Anesthesia Post-op Note  Patient: [name]  Procedure(s) Performed: Procedure(s): ESOPHAGOGASTRODUODENOSCOPY (EGD) WITH PROPOFOL (N/A)  Anesthesia type:General  Patient location: PACU  Post pain: Pain level controlled  Post assessment: Post-op Vital signs reviewed, Patient's Cardiovascular Status Stable, Respiratory Function Stable, Patent Airway and No signs of Nausea or vomiting  Post vital signs: Reviewed and stable  Last Vitals:  Filed Vitals:   04/22/15 1320  BP:   Pulse: 111  Temp:   Resp: 23    Level of consciousness: awake, alert  and patient cooperative  Complications: No apparent anesthesia complications
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
## 2015-04-22 NOTE — Anesthesia Preprocedure Evaluation (Addendum)
Anesthesia Evaluation  Patient identified by MRN, date of birth, ID band Patient awake    Reviewed: Allergy & Precautions, NPO status , Patient's Chart, lab work & pertinent test results, reviewed documented beta blocker date and time   History of Anesthesia Complications Negative for: history of anesthetic complications  Airway Mallampati: III  TM Distance: >3 FB Neck ROM: limited    Dental  (+) Lower Dentures, Upper Dentures   Pulmonary COPD COPD inhaler and oxygen dependent, former smoker,  breath sounds clear to auscultation  Pulmonary exam normal       Cardiovascular Exercise Tolerance: Poor hypertension, + CAD, + Past MI, + Peripheral Vascular Disease and +CHF Normal cardiovascular exam+ dysrhythmias Rhythm:regular Rate:Normal     Neuro/Psych PSYCHIATRIC DISORDERS Anxiety Depression Bipolar Disorder negative neurological ROS     GI/Hepatic negative GI ROS, Neg liver ROS,   Endo/Other  negative endocrine ROSdiabetes  Renal/GU negative Renal ROS  negative genitourinary   Musculoskeletal negative musculoskeletal ROS (+)   Abdominal   Peds negative pediatric ROS (+)  Hematology  (+) anemia ,   Anesthesia Other Findings 2014  - Left ventricle: The cavity size was normal. Wall thickness was increased in a pattern of mild LVH. Systolic function was vigorous. The estimated ejection fraction was in the range of 65% to 70%. Regional wall motion abnormalities cannot be excluded. Doppler parameters are consistent with abnormal left ventricular relaxation (grade 1 diastolic dysfunction). - Left atrium: The atrium was mildly dilated. - Pericardium, extracardiac: A trivial pericardial effusion was identified.  Reproductive/Obstetrics negative OB ROS                           Anesthesia Physical Anesthesia Plan  ASA: IV  Anesthesia Plan: General   Post-op Pain Management:     Induction:   Airway Management Planned:   Additional Equipment:   Intra-op Plan:   Post-operative Plan:   Informed Consent: I have reviewed the patients History and Physical, chart, labs and discussed the procedure including the risks, benefits and alternatives for the proposed anesthesia with the patient or authorized representative who has indicated his/her understanding and acceptance.   Dental Advisory Given  Plan Discussed with: Anesthesiologist, CRNA and Surgeon  Anesthesia Plan Comments: (Patient informed that he is higher risk for complications from anesthesia.)       Anesthesia Quick Evaluation

## 2015-04-22 NOTE — Progress Notes (Signed)
Pt FSBS 415, rechecked at 406.  Dr. Darvin Neighbours made aware, orders received.

## 2015-04-22 NOTE — ED Notes (Addendum)
Pt calling out every 5-15 min's concerning something can be rude and belligerent. Pt is very anxious as well.

## 2015-04-22 NOTE — Progress Notes (Addendum)
Patient was admitted from ER via stretcher for recurrent GI bleed. Patient was A&O X4, on 3L of oxygen, denied pain and N&V. His skin looks pale but does not appear to be in any distress. Patient's admission documentation was completed and 1 of 2 order for RBC was obtained and infusion started after dual verification with another RN. Patient VS remained stable and tolerated blood infusion. Patient was provided ice chips per Dr. Marcille Blanco. Will continue to monitor.

## 2015-04-22 NOTE — Progress Notes (Signed)
[facility] NAME: Airik Goodlin    MR#:  [id number]  DATE OF BIRTH:  [birth year]  SUBJECTIVE:  CHIEF COMPLAINT:   Chief Complaint  Patient presents with  . Respiratory Distress  . Melena   Had melena again overnight. No vomiting. Diffuse abd pain REVIEW OF SYSTEMS:    Review of Systems  Constitutional: Positive for weight loss. Negative for fever and chills.  HENT: Negative for sore throat.   Eyes: Negative for blurred vision, double vision and pain.  Respiratory: Positive for shortness of breath (chronic). Negative for cough, hemoptysis and wheezing.   Cardiovascular: Negative for chest pain, palpitations, orthopnea and leg swelling.  Gastrointestinal: Positive for abdominal pain, blood in stool and melena. Negative for heartburn, nausea, vomiting, diarrhea and constipation.  Genitourinary: Negative for dysuria and hematuria.  Musculoskeletal: Negative for back pain and joint pain.  Skin: Negative for rash.  Neurological: Positive for weakness. Negative for sensory change, speech change, focal weakness and headaches.  Endo/Heme/Allergies: Does not bruise/bleed easily.  Psychiatric/Behavioral: Positive for depression. The patient is not nervous/anxious.       DRUG ALLERGIES:   Allergies  Allergen Reactions  . Penicillins Anaphylaxis and Hives  . Demerol [Meperidine] Other (See Comments)    hallucinations  . Dilaudid [Hydromorphone Hcl] Other (See Comments)    hallucinations  . Levaquin [Levofloxacin In D5w]     Nausea and diarrhea    VITALS:  Blood pressure 121/73, pulse 114, temperature 98.1 F (36.7 C), temperature source Oral, resp. rate 18, height 6\' 2"  (1.88 m), weight 112.492 kg (248 lb), SpO2 100 %.  PHYSICAL EXAMINATION:   Physical Exam  GENERAL:  74 y.o.-year-old patient lying in the bed with no acute distress. Ple EYES: Pupils equal, round, reactive to light and accommodation. No scleral  icterus. Extraocular muscles intact.  HEENT: Head atraumatic, normocephalic. Oropharynx and nasopharynx clear.  NECK:  Supple, no jugular venous distention. No thyroid enlargement, no tenderness.  LUNGS: Normal breath sounds bilaterally, no wheezing, rales, rhonchi. No use of accessory muscles of respiration.  CARDIOVASCULAR: S1, S2 normal. No murmurs, rubs, or gallops.  ABDOMEN: Soft, nontender, nondistended. Bowel sounds present. No organomegaly or mass.  EXTREMITIES: No cyanosis, clubbing or edema b/l.    NEUROLOGIC: Cranial nerves II through XII are intact. No focal Motor or sensory deficits b/l.   PSYCHIATRIC: The patient is alert and oriented x 3.  SKIN: No obvious rash, lesion, or ulcer.    LABORATORY PANEL:   CBC  Recent Labs Lab 04/22/15 0052  WBC 23.4*  HGB 6.2*  HCT 21.2*  PLT 208   ------------------------------------------------------------------------------------------------------------------  Chemistries   Recent Labs Lab 04/22/15 0052  NA 138  K 4.6  CL 105  CO2 24  GLUCOSE 326*  BUN 34*  CREATININE 1.11  CALCIUM 8.0*  AST 19  ALT 13*  ALKPHOS 79  BILITOT 0.3   ------------------------------------------------------------------------------------------------------------------  Cardiac Enzymes  Recent Labs Lab 04/22/15 0052  TROPONINI 0.03   ------------------------------------------------------------------------------------------------------------------  RADIOLOGY:  Ct Abdomen Pelvis W Contrast  04/22/2015   CLINICAL DATA:  Generalized abdominal pain and melena.  EXAM: CT ABDOMEN AND PELVIS WITH CONTRAST  TECHNIQUE: Multidetector CT imaging of the abdomen and pelvis was performed using the standard protocol following bolus administration of intravenous contrast.  CONTRAST:  135mL OMNIPAQUE IOHEXOL 300 MG/ML  SOLN  COMPARISON:  12/29/2014  FINDINGS: BODY WALL: Fatty bilateral inguinal hernias with early herniation of small bowel on the right.  LOWER CHEST: Large sliding hiatal hernia without obstruction or inflammatory change.  Mild atelectasis at the left base.  Coronary atherosclerosis.  ABDOMEN/PELVIS:  Liver: No focal abnormality.  Biliary: No evidence of biliary obstruction or stone.  Pancreas: Unremarkable.  Spleen: Unremarkable.  Adrenals: Unremarkable.  Kidneys and ureters: No hydronephrosis or stone. Stable scarring to the lower pole right kidney, likely from covering of a lower pole accessory renal artery.  Bladder: Unremarkable.  Reproductive: No pathologic findings.  Bowel: Appendectomy. There is colonic diverticulosis, present mainly distally. Large sliding hiatal hernia. No inflammatory bowel wall thickening. A small distal duodenum diverticulum is noted, uncomplicated. No evidence of peptic ulcer disease, aortoenteric fistula or other explanation for melena.  Retroperitoneum: No mass or adenopathy.  Peritoneum: No ascites or pneumoperitoneum.  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 No evidence of major vessel stenosis or occlusion. Small volume gas within the right common femoral vein is presumably from vascular access. There is been right hypogastric artery embolization.  OSSEOUS: No acute abnormalities.  IMPRESSION: 1. No acute finding or explanation for melena. 2. Incidental findings are stable from prior study and noted above.   Electronically Signed   By: Monte Fantasia M.D.   On: 04/22/2015 03:55   Dg Chest Portable 1 View  04/22/2015   CLINICAL DATA:  Acute onset of bilateral chest pain and generalized abdominal pain. Shortness of breath. Initial encounter.  EXAM: PORTABLE CHEST - 1 VIEW  COMPARISON:  Chest radiograph performed 04/10/2015  FINDINGS: The lungs are well-aerated. A small left pleural effusion is noted. Pulmonary vascularity is at the upper limits of normal. Left basilar opacity likely reflects atelectasis. There is no evidence of  pneumothorax.  The cardiomediastinal silhouette is mildly enlarged. The patient is status post median sternotomy. No acute osseous abnormalities are seen.  IMPRESSION: Small left pleural effusion noted. Mild cardiomegaly. Persistent mild left basilar airspace opacity likely reflects atelectasis.   Electronically Signed   By: Garald Balding M.D.   On: 04/22/2015 01:19     ASSESSMENT AND PLAN:   * GI bleed. Has had both upper and lower GI bleed with multiple endoscopies. He has melena this time. Likely upper Discussed with GI. EGD later today. Protonix drip. Serial Hb.  * Acute blood loss Anemia Transfuse 2 units of packed red blood cells Repeat Hb after transfusion.  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 Will start lower dose. SSI  * Coronary artery disease: Stable. Status post CABG  * Peripheral artery disease: Status post aortofemoral stent placement  * COPD with chronic resp failure On PRN Nebs and inhalers. Continue O2.  All the records are reviewed and case discussed with Care Management/Social Workerr. Management plans discussed with the patient, family and they are in agreement.  CODE STATUS: FULL CODE  DVT Prophylaxis: SCDs  TOTAL TIME TAKING CARE OF THIS PATIENT: 35 minutes.    Hillary Bow R M.D on 04/22/2015 at 9:58 AM  Between 7am to 6pm - Pager - [phone]  After 6pm go to [email or web address] - password [email or web address]  Office  [phone]  CC: Primary care physician; Dustin Flock, MD

## 2015-04-22 NOTE — ED Notes (Signed)
Pt to be held down in ER until after CT scan to make sure surgery isnt needed

## 2015-04-22 NOTE — Progress Notes (Signed)
Dr. Darvin Neighbours aware of 2 episodes of vtach, orders received
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
## 2015-04-22 NOTE — Op Note (Signed)
[facility] Gastroenterology Patient Name: [name] Procedure Date: 04/22/2015 12:37 PM MRN: [id number] Account #: [id number] Date of Birth: 11/16/1940 Admit Type: Inpatient Age: 74 Room: [facility] ENDO ROOM 4 Gender: Male Note Status: Finalized Procedure:         Upper GI endoscopy Indications:       Heme positive stool Providers:         [name], MD Medicines:         Propofol per Anesthesia Complications:     No immediate complications. Procedure:         Pre-Anesthesia Assessment:                    - Prior to the procedure, a History and Physical was                     performed, and patient medications and allergies were                     reviewed. The patient's tolerance of previous anesthesia                     was also reviewed. The risks and benefits of the procedure                     and the sedation options and risks were discussed with the                     patient. All questions were answered, and informed consent                     was obtained. Prior Anticoagulants: The patient has taken                     no previous anticoagulant or antiplatelet agents. ASA                     Grade Assessment: IV - A patient with severe systemic                     disease that is a constant threat to life. After reviewing                     the risks and benefits, the patient was deemed in                     satisfactory condition to undergo the procedure.                    After obtaining informed consent, the endoscope was passed                     under direct vision. Throughout the procedure, the                     patient's blood pressure, pulse, and oxygen saturations                     were monitored continuously. The Endoscope was introduced                     through the mouth, and advanced to the second part of  duodenum. The upper GI endoscopy was accomplished without                     difficulty. The patient  tolerated the procedure well. Findings:      The examined esophagus was normal.      A single small angioectasia with bleeding was found in the gastric       fundus. Coagulation for hemostasis using argon plasma at 2 liters/minute       and 30 watts was successful.      A few small angioectasias with bleeding were found in the second part of       the duodenum. Coagulation for hemostasis using argon plasma at 2       liters/minute and 30 watts was successful. Impression:        - Normal esophagus.                    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 Treated                     with argon plasma coagulation (APC).                    - A few bleeding angioectasias in the duodenum. Treated                     with argon plasma coagulation (APC).                    - No specimens collected. Recommendation:    - Return patient to hospital ward for ongoing care. Procedure Code(s): --- Professional ---                    [phone], Esophagogastroduodenoscopy, flexible, transoral;                     with control of bleeding, any method Diagnosis Code(s): --- Professional ---                    R19.5, Other fecal abnormalities                    K31.811, Angiodysplasia of stomach and duodenum with                     bleeding CPT copyright 2014 [employer]. All rights reserved. The codes documented in this report are preliminary and upon coder review may  be revised to meet current compliance requirements. [name], MD 04/22/2015 1:01:33 PM This report has been signed electronically. Number of Addenda: 0 Note Initiated On: 04/22/2015 12:37 PM      [facility]

## 2015-04-22 NOTE — Transfer of Care (Signed)
Immediate Anesthesia Transfer of Care Note  Patient: [name]  Procedure(s) Performed: Procedure(s): ESOPHAGOGASTRODUODENOSCOPY (EGD) WITH PROPOFOL (N/A)  Patient Location: PACU  Anesthesia Type:General  Level of Consciousness: awake, alert , oriented and patient cooperative  Airway & Oxygen Therapy: Patient Spontanous Breathing and Patient connected to nasal cannula oxygen  Post-op Assessment: Report given to RN and Post -op Vital signs reviewed and stable  Post vital signs: Reviewed and stable  Last Vitals:  Filed Vitals:   04/22/15 1303  BP: 122/76  Pulse:   Temp: 36 C  Resp: 25    Complications: No apparent anesthesia complications
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
## 2015-04-22 NOTE — Progress Notes (Signed)
Pt has had 2 runs of vtach  While standing to void.  11 beats/18 beats.  Page out to Dr. Darvin Neighbours

## 2015-04-22 NOTE — Progress Notes (Signed)
To endo via bed with O2.
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
## 2015-04-22 NOTE — Progress Notes (Signed)
Assessment completed.  Protonix gtt infusing rt ankle at 25mg /hr.  SL rt/lt ac flushes well.  No distress on 3LO2 per [place].  Cardiac monitor in place, pt denies chest pain.  Lungs diminished lower lobes bil.  No active bleeding at this time.  Fall precautions in place.  SR up x 2, bed alarm on.

## 2015-04-22 NOTE — Progress Notes (Signed)
Back from Endo, alert and oriented x3. No active bleeding. Protonix gtt @25mg /hr infusing well lt ac.  Denies need at this time.  CB in reach, SR up x 2, bed alarm on.

## 2015-04-23 ENCOUNTER — Inpatient Hospital Stay: Payer: Commercial Managed Care - HMO

## 2015-04-23 DIAGNOSIS — K31819 Angiodysplasia of stomach and duodenum without bleeding: Secondary | ICD-10-CM | POA: Insufficient documentation

## 2015-04-23 DIAGNOSIS — K31811 Angiodysplasia of stomach and duodenum with bleeding: Secondary | ICD-10-CM | POA: Insufficient documentation

## 2015-04-23 DIAGNOSIS — Q2733 Arteriovenous malformation of digestive system vessel: Principal | ICD-10-CM

## 2015-04-23 LAB — GLUCOSE, CAPILLARY
Glucose-Capillary: 331 mg/dL — ABNORMAL HIGH (ref 65–99)
Glucose-Capillary: 288 mg/dL — ABNORMAL HIGH (ref 65–99)
Glucose-Capillary: 397 mg/dL — ABNORMAL HIGH (ref 65–99)
Glucose-Capillary: 426 mg/dL — ABNORMAL HIGH (ref 65–99)

## 2015-04-23 LAB — HEMOGLOBIN AND HEMATOCRIT, BLOOD
HCT: 22.5 % — ABNORMAL LOW (ref 40.0–52.0)
HEMOGLOBIN: 6.9 g/dL — AB (ref 13.0–18.0)

## 2015-04-23 LAB — HEMOGLOBIN: Hemoglobin: 7.3 g/dL — ABNORMAL LOW (ref 13.0–18.0)

## 2015-04-23 LAB — PREPARE RBC (CROSSMATCH)

## 2015-04-23 MED ORDER — INSULIN ASPART 100 UNIT/ML ~~LOC~~ SOLN
5.0000 [IU] | Freq: Three times a day (TID) | SUBCUTANEOUS | Status: DC
  Administered 2015-04-23 – 2015-04-27 (×12): 5 [IU] via SUBCUTANEOUS
  Filled 2015-04-23 (×12): qty 5

## 2015-04-23 MED ORDER — METHYLPREDNISOLONE SODIUM SUCC 40 MG IJ SOLR
40.0000 mg | Freq: Two times a day (BID) | INTRAMUSCULAR | Status: DC
  Administered 2015-04-23 – 2015-04-24 (×2): 40 mg via INTRAVENOUS
  Filled 2015-04-23 (×2): qty 1

## 2015-04-23 MED ORDER — LEVOFLOXACIN IN D5W 500 MG/100ML IV SOLN
500.0000 mg | Freq: Once | INTRAVENOUS | Status: AC
  Administered 2015-04-23: 500 mg via INTRAVENOUS
  Filled 2015-04-23: qty 100

## 2015-04-23 MED ORDER — DEXTROMETHORPHAN POLISTIREX ER 30 MG/5ML PO SUER
15.0000 mg | Freq: Two times a day (BID) | ORAL | Status: DC | PRN
  Administered 2015-04-23 – 2015-04-24 (×2): 15 mg via ORAL
  Filled 2015-04-23 (×4): qty 2.5

## 2015-04-23 MED ORDER — IPRATROPIUM-ALBUTEROL 0.5-2.5 (3) MG/3ML IN SOLN
3.0000 mL | Freq: Four times a day (QID) | RESPIRATORY_TRACT | Status: DC
  Administered 2015-04-23: 3 mL via RESPIRATORY_TRACT
  Filled 2015-04-23 (×2): qty 3

## 2015-04-23 MED ORDER — LEVOFLOXACIN IN D5W 500 MG/100ML IV SOLN
500.0000 mg | INTRAVENOUS | Status: DC
  Filled 2015-04-23: qty 100

## 2015-04-23 MED ORDER — SODIUM CHLORIDE 0.9 % IV SOLN
Freq: Once | INTRAVENOUS | Status: AC
Start: 2015-04-23 — End: 2015-04-24
  Administered 2015-04-23: 19:00:00 via INTRAVENOUS

## 2015-04-23 MED ORDER — BUDESONIDE 0.5 MG/2ML IN SUSP
0.5000 mg | Freq: Two times a day (BID) | RESPIRATORY_TRACT | Status: DC
  Administered 2015-04-23 – 2015-04-27 (×9): 0.5 mg via RESPIRATORY_TRACT
  Filled 2015-04-23 (×9): qty 2

## 2015-04-23 MED ORDER — DEXTROMETHORPHAN POLISTIREX ER 30 MG/5ML PO SUER
15.0000 mg | Freq: Two times a day (BID) | ORAL | Status: DC | PRN
Start: 2015-04-23 — End: 2015-04-23
  Filled 2015-04-23: qty 5

## 2015-04-23 MED ORDER — ALBUTEROL SULFATE (2.5 MG/3ML) 0.083% IN NEBU
2.5000 mg | INHALATION_SOLUTION | RESPIRATORY_TRACT | Status: DC
  Administered 2015-04-23 – 2015-04-27 (×23): 2.5 mg via RESPIRATORY_TRACT
  Filled 2015-04-23 (×25): qty 3

## 2015-04-23 MED ORDER — INSULIN GLARGINE 100 UNIT/ML ~~LOC~~ SOLN
17.0000 [IU] | Freq: Every day | SUBCUTANEOUS | Status: DC
  Administered 2015-04-23 – 2015-04-26 (×4): 17 [IU] via SUBCUTANEOUS
  Filled 2015-04-23 (×6): qty 0.17

## 2015-04-23 NOTE — Progress Notes (Signed)
MD Gouru was made aware that hemoglobin was 7.3. Order to put in one time hemoglobin now and transfuse if lower than 7.3.

## 2015-04-23 NOTE — Care Management (Addendum)
Patient presented to ED  for weakness, shortness of breath and tarry stools.  He was just discharge room beh med unit what appears to have been 04/21/2015.  Patient had found to have gib and  had EGD 7/6.  Hgb today 7.3.  There is a pulmonary consult pending for ?hemoptysis (?).  Patient with history of bipolar disorder and it is documented that when discharged from beh med, was referred to [facility].  Patient presents from home with wife.  He is on chronic home 02.   Discussed the need to ambulate patient prior to discharged. At present unsure if will need home health services.

## 2015-04-23 NOTE — Progress Notes (Signed)
MD Gouru notified of Hbg 7.0. Order to transfuse 1 unit over 4 hours.

## 2015-04-23 NOTE — Progress Notes (Signed)
Subjective: Denies nausea, vomiting, melena or abdominal pain.  Objective: Vital signs in last 24 hours: Temp:  [96.8 F (36 C)-98.8 F (37.1 C)] 97.6 F (36.4 C) (07/07 0421) Pulse Rate:  [85-122] 99 (07/07 0714) Resp:  [18-25] 20 (07/07 0714) BP: (107-144)/(49-85) 111/49 mmHg (07/07 0714) SpO2:  [96 %-100 %] 97 % (07/07 0714) Last BM Date: 04/21/15 No LMP for male patient. Body mass index is 31.83 kg/(m^2). General:   Alert,  obese, pleasant and cooperative in NAD Head:  Normocephalic and atraumatic. Eyes:  Sclera clear, no icterus.   Conjunctiva pink. Mouth:  No deformity or lesions, oropharynx pink & moist. Neck:  Supple; no masses or thyromegaly. Heart:  Regular rate and rhythm; no murmurs, clicks, rubs, or gallops. Abdomen:   Normal bowel sounds.  Soft, nontender and nondistended. No masses, hepatosplenomegaly or hernias noted.  No guarding or rebound tenderness.   Msk:  Symmetrical without gross deformities. Good equal movement & strength bilaterally. Pulses:  Normal pulses noted. Extremities:  Without clubbing or edema.  No cyanosis Neurologic:  Alert and  oriented x3;  grossly normal neurologically. Skin:  Pale, Intact without significant lesions or rashes. Cervical Nodes:  No significant cervical adenopathy. Psych:  Alert and cooperative. Normal mood and affect.  Intake/Output from previous day: 07/06 0701 - 07/07 0700 In: 1588 [P.O.:480; I.V.:200; Blood:608; IV Piggyback:300] Out: 750 [Urine:750]  Lab Results:  Recent Labs  04/22/15 0052 04/22/15 1525 04/23/15 0410  WBC 23.4*  --   --   HGB 6.2* 7.5* 7.3*  HCT 21.2* 24.9*  --   PLT 208  --   --    BMET  Recent Labs  04/22/15 0052  NA 138  K 4.6  CL 105  CO2 24  GLUCOSE 326*  BUN 34*  CREATININE 1.11  CALCIUM 8.0*   LFT  Recent Labs  04/22/15 0052  PROT 5.2*  ALBUMIN 2.8*  AST 19  ALT 13*  ALKPHOS 79  BILITOT 0.3  Studies/Results: Ct Abdomen Pelvis W Contrast  04/22/2015   CLINICAL  DATA:  Generalized abdominal pain and melena.  EXAM: CT ABDOMEN AND PELVIS WITH CONTRAST  TECHNIQUE: Multidetector CT imaging of the abdomen and pelvis was performed using the standard protocol following bolus administration of intravenous contrast.  CONTRAST:  163mL OMNIPAQUE IOHEXOL 300 MG/ML  SOLN  COMPARISON:  12/29/2014  FINDINGS: BODY WALL: Fatty bilateral inguinal hernias with early herniation of small bowel on the right.  LOWER CHEST: Large sliding hiatal hernia without obstruction or inflammatory change.  Mild atelectasis at the left base.  Coronary atherosclerosis.  ABDOMEN/PELVIS:  Liver: No focal abnormality.  Biliary: No evidence of biliary obstruction or stone.  Pancreas: Unremarkable.  Spleen: Unremarkable.  Adrenals: Unremarkable.  Kidneys and ureters: No hydronephrosis or stone. Stable scarring to the lower pole right kidney, likely from covering of a lower pole accessory renal artery.  Bladder: Unremarkable.  Reproductive: No pathologic findings.  Bowel: Appendectomy. There is colonic diverticulosis, present mainly distally. Large sliding hiatal hernia. No inflammatory bowel wall thickening. A small distal duodenum diverticulum is noted, uncomplicated. No evidence of peptic ulcer disease, aortoenteric fistula or other explanation for melena.  Retroperitoneum: No mass or adenopathy.  Peritoneum: No ascites or pneumoperitoneum.  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 No evidence of major vessel stenosis or occlusion. Small volume gas within the right common femoral vein is presumably from vascular access. There is been right hypogastric artery embolization.  OSSEOUS: No acute abnormalities.  IMPRESSION: 1. No acute finding or explanation for melena. 2. Incidental findings are stable from prior study and noted above.   Electronically Signed   By: Monte Fantasia M.D.   On: 04/22/2015 03:55   Dg Chest Portable 1  View  04/22/2015   CLINICAL DATA:  Acute onset of bilateral chest pain and generalized abdominal pain. Shortness of breath. Initial encounter.  EXAM: PORTABLE CHEST - 1 VIEW  COMPARISON:  Chest radiograph performed 04/10/2015  FINDINGS: The lungs are well-aerated. A small left pleural effusion is noted. Pulmonary vascularity is at the upper limits of normal. Left basilar opacity likely reflects atelectasis. There is no evidence of pneumothorax.  The cardiomediastinal silhouette is mildly enlarged. The patient is status post median sternotomy. No acute osseous abnormalities are seen.  IMPRESSION: Small left pleural effusion noted. Mild cardiomegaly. Persistent mild left basilar airspace opacity likely reflects atelectasis.   Electronically Signed   By: Garald Balding M.D.   On: 04/22/2015 01:19    Assessment: UGI bleed secondary to gastric & small bowel AVMS:  S/p APC.  No further bleeding. Anemia: secondary to #1, Hgb stable  Plan: #1  Monitor for recurrent bleeding #2 Continue PPI  The care of GARI HARTSELL will be discussed in direct collaboration with Dr [name], Attending Gastroenterologist.  LOS: 1 day  Vickey Huger  04/23/2015, 11:24 AM [facility]  [place], [place] [address] Phone: [phone] Fax : [phone]

## 2015-04-23 NOTE — Progress Notes (Signed)
Inpatient Diabetes Program Recommendations  AACE/ADA: New Consensus Statement on Inpatient Glycemic Control (2013)  Target Ranges:  Prepandial:   less than 140 mg/dL      Peak postprandial:   less than 180 mg/dL (1-2 hours)      Critically ill patients:  140 - 180 mg/dL   Reason for assessment: elevated CBG  Diabetes history: Type 2 Outpatient Diabetes medications: Lantus 12 units daily, Novolog 0-15 units tid with meals and hs, Novolog 5 units tid with meals, Metformin 500mg /day Current orders for Inpatient glycemic control: Lantus 10 units daily, Novolog 0-15 units tid  Fasting blood sugars elevated today at 331mg /dl- consider increasing Lantus to 17 units qhs, (0.15units/kg).    Consider adding Novolog 5 units tid with meals (this is his current home dose)  Consider increasing Novolog correction to resistant scale, 0-20 units tid  Consider adding Novolog hs correction insulin 0-5 units.    Gentry Fitz, RN, BA, MHA, CDE Diabetes Coordinator Inpatient Diabetes Program  [phone] (Team Pager) [phone] ([place]) 04/23/2015 10:00 AM

## 2015-04-23 NOTE — Progress Notes (Signed)
2 L  Of oxygen. NSR. FS are stable. Takes meds ok. Fair appetite. Hbg 7.3 and waiting for once hemoglobin. Pulmonary consult was added. IV levaquin. IV solumedrol. CT chest showed scarring, wall thickening, and hital hernia. Pt has not reported any pain. Family at the bedside. Pt has no further concerns at this time.

## 2015-04-23 NOTE — Progress Notes (Signed)
MD Gouru was made aware of pt spitting up brown and black sputum. No further concerns at this time.

## 2015-04-23 NOTE — Progress Notes (Signed)
ANTIBIOTIC CONSULT NOTE - INITIAL  Pharmacy Consult for Levofloxacin Indication: pneumonia  Allergies  Allergen Reactions  . Penicillins Anaphylaxis and Hives  . Demerol [Meperidine] Other (See Comments)    hallucinations  . Dilaudid [Hydromorphone Hcl] Other (See Comments)    hallucinations  . Levaquin [Levofloxacin In D5w]     Nausea and diarrhea    Patient Measurements: Height: 6\' 2"  (188 cm) Weight: 248 lb (112.492 kg) IBW/kg (Calculated) : 82.2 Adjusted Body Weight:   Vital Signs: Temp: 97.8 F (36.6 C) (07/07 1143) Temp Source: Oral (07/07 0421) BP: 109/71 mmHg (07/07 1143) Pulse Rate: 87 (07/07 1143) Intake/Output from previous day: 07/06 0701 - 07/07 0700 In: 1588 [P.O.:480; I.V.:200; Blood:608; IV Piggyback:300] Out: 750 [Urine:750] Intake/Output from this shift:    Labs:  Recent Labs  04/22/15 0052 04/22/15 1525 04/23/15 0410  WBC 23.4*  --   --   HGB 6.2* 7.5* 7.3*  PLT 208  --   --   CREATININE 1.11  --   --    Estimated Creatinine Clearance: 79.1 mL/min (by C-G formula based on Cr of 1.11). No results for input(s): VANCOTROUGH, VANCOPEAK, VANCORANDOM, GENTTROUGH, GENTPEAK, GENTRANDOM, TOBRATROUGH, TOBRAPEAK, TOBRARND, AMIKACINPEAK, AMIKACINTROU, AMIKACIN in the last 72 hours.   Microbiology: No results found for this or any previous visit (from the past 720 hour(s)).  Medical History: Past Medical History  Diagnosis Date  . Allergic rhinitis, cause unspecified   . Other and unspecified hyperlipidemia   . 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 Unspecified essential hypertension   . 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 Severe bullous disease of LUL. Prolonged intubation after surgeries due to COPD  . 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 Anemia     Recurrent microcytic,  presumably GI   . Complication of anesthesia     trouble getting off ventilator  . Memory loss   . Diabetes   . CHF (congestive heart failure)   . CAD (coronary artery disease)   . GI bleed requiring more than 4 units of blood in 24 hours, ICU, or surgery     Hx bleeding gastric polyps, cecal & sigmoid AVMS s/p APC 03/30/14  . AVM (arteriovenous malformation) of colon with hemorrhage   . Multiple gastric polyps   . Diverticulosis     Medications:  Scheduled:  . albuterol  2.5 mg Nebulization Q4H  . atorvastatin  40 mg Oral QHS  . budesonide (PULMICORT) nebulizer solution  0.5 mg Nebulization BID  . budesonide-formoterol  2 puff Inhalation BID  . citalopram  40 mg Oral Daily  . divalproex  500 mg Oral QHS  . docusate sodium  100 mg Oral BID  . folic acid  2 mg Oral q morning - 10a  . insulin aspart  0-15 Units Subcutaneous TID WC  . insulin glargine  10 Units Subcutaneous QHS  . ipratropium-albuterol  3 mL Nebulization Q6H  . levofloxacin (LEVAQUIN) IV  500 mg Intravenous Once  . [START ON 04/24/2015] levofloxacin (LEVAQUIN) IV  500 mg Intravenous Q24H  . lisinopril  5 mg Oral Daily  . methylPREDNISolone (SOLU-MEDROL) injection  40 mg Intravenous Q12H  . metoprolol tartrate  25 mg Oral BID  . sodium chloride  3 mL Intravenous Q12H  . sucralfate  1 g Oral TID WC & HS  .  tiotropium  18 mcg Inhalation Daily  . traZODone  50 mg Oral TID  . Vitamin D (Ergocalciferol)  50,000 Units Oral Q7 days   Assessment: Patient being treated with Levofloxacin for CAP.   Goal of Therapy:  Improve symptoms  Plan:  Will start Levofloxacin 500 mg IV q24 hours.  Follow up culture results  Shaketha Jeon D 04/23/2015,1:23 PM

## 2015-04-23 NOTE — Progress Notes (Signed)
[facility] NAME: [name]    MR#:  [id number]  DATE OF BIRTH:  1941/04/30  SUBJECTIVE:  CHIEF COMPLAINT:   Chief Complaint  Patient presents with  . Respiratory Distress  . Melena   Had melena , status post EGD7/6 , coagulation of angiectasia Reporting coughing up blackish phlegm today witnessed by me No bowel movement since yesterday No vomiting. Diffuse abd pain REVIEW OF SYSTEMS:    Review of Systems  Constitutional: Positive for weight loss. Negative for fever and chills.  HENT: Negative for sore throat.   Eyes: Negative for blurred vision, double vision and pain.  Respiratory: Positive for shortness of breath (chronic). Negative for cough, hemoptysis and wheezing.   Cardiovascular: Negative for chest pain, palpitations, orthopnea and leg swelling.  Gastrointestinal: Positive for abdominal pain, blood in stool and melena. Negative for heartburn, nausea, vomiting, diarrhea and constipation.  Genitourinary: Negative for dysuria and hematuria.  Musculoskeletal: Negative for back pain and joint pain.  Skin: Negative for rash.  Neurological: Positive for weakness. Negative for sensory change, speech change, focal weakness and headaches.  Endo/Heme/Allergies: Does not bruise/bleed easily.  Psychiatric/Behavioral: Positive for depression. The patient is not nervous/anxious.       DRUG ALLERGIES:   Allergies  Allergen Reactions  . Penicillins Anaphylaxis and Hives  . Demerol [Meperidine] Other (See Comments)    hallucinations  . Dilaudid [Hydromorphone Hcl] Other (See Comments)    hallucinations  . Levaquin [Levofloxacin In D5w]     Nausea and diarrhea    VITALS:  Blood pressure 109/71, pulse 87, temperature 97.8 F (36.6 C), temperature source Oral, resp. rate 17, height 6\' 2"  (1.88 m), weight 112.492 kg (248 lb), SpO2 99 %.  PHYSICAL EXAMINATION:   Physical Exam  GENERAL:  74 y.o.-year-old  patient lying in the bed with no acute distress. Ple EYES: Pupils equal, round, reactive to light and accommodation. No scleral icterus. Extraocular muscles intact.  HEENT: Head atraumatic, normocephalic. Oropharynx and nasopharynx clear.  NECK:  Supple, no jugular venous distention. No thyroid enlargement, no tenderness.  LUNGS: Normal breath sounds bilaterally, no wheezing, rales, rhonchi. No use of accessory muscles of respiration.  CARDIOVASCULAR: S1, S2 normal. No murmurs, rubs, or gallops.  ABDOMEN: Soft, nontender, distended. Bowel sounds present. No organomegaly or mass.  EXTREMITIES: No cyanosis, clubbing or edema b/l.    NEUROLOGIC: Cranial nerves II through XII are intact. No focal Motor or sensory deficits b/l.   PSYCHIATRIC: The patient is alert and oriented x 3.  SKIN: No obvious rash, lesion, or ulcer.    LABORATORY PANEL:   CBC  Recent Labs Lab 04/22/15 0052 04/22/15 1525 04/23/15 0410  WBC 23.4*  --   --   HGB 6.2* 7.5* 7.3*  HCT 21.2* 24.9*  --   PLT 208  --   --    ------------------------------------------------------------------------------------------------------------------  Chemistries   Recent Labs Lab 04/22/15 0052 04/22/15 1525  NA 138  --   K 4.6  --   CL 105  --   CO2 24  --   GLUCOSE 326*  --   BUN 34*  --   CREATININE 1.11  --   CALCIUM 8.0*  --   MG  --  2.1  AST 19  --   ALT 13*  --   ALKPHOS 79  --   BILITOT 0.3  --    ------------------------------------------------------------------------------------------------------------------  Cardiac Enzymes  Recent Labs Lab 04/22/15 0052  TROPONINI  0.03   ------------------------------------------------------------------------------------------------------------------  RADIOLOGY:  Ct Abdomen Pelvis W Contrast  04/22/2015   CLINICAL DATA:  Generalized abdominal pain and melena.  EXAM: CT ABDOMEN AND PELVIS WITH CONTRAST  TECHNIQUE: Multidetector CT imaging of the abdomen and pelvis  was performed using the standard protocol following bolus administration of intravenous contrast.  CONTRAST:  175mL OMNIPAQUE IOHEXOL 300 MG/ML  SOLN  COMPARISON:  12/29/2014  FINDINGS: BODY WALL: Fatty bilateral inguinal hernias with early herniation of small bowel on the right.  LOWER CHEST: Large sliding hiatal hernia without obstruction or inflammatory change.  Mild atelectasis at the left base.  Coronary atherosclerosis.  ABDOMEN/PELVIS:  Liver: No focal abnormality.  Biliary: No evidence of biliary obstruction or stone.  Pancreas: Unremarkable.  Spleen: Unremarkable.  Adrenals: Unremarkable.  Kidneys and ureters: No hydronephrosis or stone. Stable scarring to the lower pole right kidney, likely from covering of a lower pole accessory renal artery.  Bladder: Unremarkable.  Reproductive: No pathologic findings.  Bowel: Appendectomy. There is colonic diverticulosis, present mainly distally. Large sliding hiatal hernia. No inflammatory bowel wall thickening. A small distal duodenum diverticulum is noted, uncomplicated. No evidence of peptic ulcer disease, aortoenteric fistula or other explanation for melena.  Retroperitoneum: No mass or adenopathy.  Peritoneum: No ascites or pneumoperitoneum.  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 No evidence of major vessel stenosis or occlusion. Small volume gas within the right common femoral vein is presumably from vascular access. There is been right hypogastric artery embolization.  OSSEOUS: No acute abnormalities.  IMPRESSION: 1. No acute finding or explanation for melena. 2. Incidental findings are stable from prior study and noted above.   Electronically Signed   By: Monte Fantasia M.D.   On: 04/22/2015 03:55   Ct Chest High Resolution  04/23/2015   CLINICAL DATA:  74 year old male with history of cough producing blackish phlegm. History of GI bleeding.  EXAM: CT CHEST WITHOUT CONTRAST   TECHNIQUE: Multidetector CT imaging of the chest was performed following the standard protocol without intravenous contrast. High resolution imaging of the lungs, as well as inspiratory and expiratory imaging, was performed.  COMPARISON:  Chest CT 05/15/2013.  FINDINGS: Mediastinum/Lymph Nodes: Heart size is normal. There is no significant pericardial fluid, thickening or pericardial calcification. There is atherosclerosis of the thoracic aorta, the great vessels of the mediastinum and the coronary arteries, including calcified atherosclerotic plaque in the left main, left anterior descending and right coronary arteries. Status post median sternotomy for CABG, including LIMA to the LAD. No pathologically enlarged mediastinal or hilar lymph nodes. Please note that accurate exclusion of hilar adenopathy is limited on noncontrast CT scans. Large hiatal hernia. No axillary lymphadenopathy.  Lungs/Pleura: There is some focal architectural distortion, what appears to be chronic volume loss, and some mild peribronchovascular ground-glass attenuation in the left lower lobe, which is favored to reflect a combination of chronic scarring and atelectasis, as well as some peribronchovascular inflammation and mild fibrosis, potentially related to chronic recurrent aspiration in the setting of this large hiatal hernia. High-resolution images otherwise demonstrate no significant additional areas of ground-glass attenuation, subpleural reticulation, parenchymal banding, traction bronchiectasis or frank honeycombing in other portions of the lungs to suggest a background of interstitial lung disease. Inspiratory and expiratory imaging is unremarkable. Mild diffuse bronchial wall thickening with generally mild centrilobular and paraseptal emphysema. There is a large bulla in the apex of the left upper lobe. No acute consolidative airspace disease. No pleural  effusions. No suspicious appearing pulmonary nodules or masses.  Upper  Abdomen: Unremarkable.  Musculoskeletal/Soft Tissues: Median sternotomy wires. There are no aggressive appearing lytic or blastic lesions noted in the visualized portions of the skeleton.  IMPRESSION: 1. There are no findings to strongly suggest interstitial lung disease on today's examination. 2. There is a spectrum of findings in the left lower lobe, suggestive of some chronic scarring, atelectasis and likely mild fibrosis related to recurrent aspiration or prior infection, as discussed above. 3. Large hiatal hernia. 4. Mild diffuse bronchial wall thickening with mild centrilobular and paraseptal emphysema, as well as a large left apical bulla. 5. Atherosclerosis, including left main and 3 vessel coronary artery disease. Status post median sternotomy for CABG, including LIMA to the LAD.   Electronically Signed   By: Vinnie Langton M.D.   On: 04/23/2015 13:14   Dg Chest Portable 1 View  04/22/2015   CLINICAL DATA:  Acute onset of bilateral chest pain and generalized abdominal pain. Shortness of breath. Initial encounter.  EXAM: PORTABLE CHEST - 1 VIEW  COMPARISON:  Chest radiograph performed 04/10/2015  FINDINGS: The lungs are well-aerated. A small left pleural effusion is noted. Pulmonary vascularity is at the upper limits of normal. Left basilar opacity likely reflects atelectasis. There is no evidence of pneumothorax.  The cardiomediastinal silhouette is mildly enlarged. The patient is status post median sternotomy. No acute osseous abnormalities are seen.  IMPRESSION: Small left pleural effusion noted. Mild cardiomegaly. Persistent mild left basilar airspace opacity likely reflects atelectasis.   Electronically Signed   By: Garald Balding M.D.   On: 04/22/2015 01:19     ASSESSMENT AND PLAN:   * GI bleed. Has had both upper and lower GI bleed with multiple endoscopies. Discussed with GI. EGD 7/6, status post coagulation of angiectasia . Protonix drip. Serial Hb, transfuse as needed if hemoglobin is  less than or equal to 7  * Acute blood loss Anemia Transfused 2 units of packed red blood cells Repeat Hb after transfusion.  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 Will start lower dose. SSI  * Coronary artery disease: Stable. Status post CABG  * Peripheral artery disease: Status post aortofemoral stent placement  * COPD with chronic resp failure/reporting blackish phlegm today High-resolution CT chest is not impressive Pulmonology consult is placed, patient sees [facility] as an outpatient On PRN Nebs and inhalers. Continue O2.  * Generalized weaknes PT consult for deconditioning   All the records are reviewed and case discussed with Care Management/Social Workerr. Management plans discussed with the patient, family and they are in agreement.  CODE STATUS: FULL CODE  DVT Prophylaxis: SCDs  TOTAL TIME TAKING CARE OF THIS PATIENT: 35 minutes.    Nicholes Mango M.D on 04/23/2015 at 2:34 PM  Between 7am to 6pm - Pager - [phone]  After 6pm go to [email or web address] - password [email or web address] [facility]  Office  [phone]  CC: Primary care physician; Dustin Flock, MD

## 2015-04-24 ENCOUNTER — Encounter: Payer: Self-pay | Admitting: Gastroenterology

## 2015-04-24 LAB — BASIC METABOLIC PANEL
ANION GAP: 11 (ref 5–15)
BUN: 33 mg/dL — ABNORMAL HIGH (ref 6–20)
CALCIUM: 8.2 mg/dL — AB (ref 8.9–10.3)
CHLORIDE: 104 mmol/L (ref 101–111)
CO2: 23 mmol/L (ref 22–32)
Creatinine, Ser: 1.25 mg/dL — ABNORMAL HIGH (ref 0.61–1.24)
GFR calc Af Amer: >60 mL/min (ref >60)
GFR, EST NON AFRICAN AMERICAN: 55 mL/min — AB (ref >60)
Glucose, Bld: 386 mg/dL — ABNORMAL HIGH (ref 65–99)
Potassium: 5 mmol/L (ref 3.5–5.1)
SODIUM: 138 mmol/L (ref 135–145)

## 2015-04-24 LAB — GLUCOSE, CAPILLARY
Glucose-Capillary: 423 mg/dL — ABNORMAL HIGH (ref 65–99)
Glucose-Capillary: 450 mg/dL — ABNORMAL HIGH (ref 65–99)
Glucose-Capillary: 387 mg/dL — ABNORMAL HIGH (ref 65–99)
Glucose-Capillary: 418 mg/dL — ABNORMAL HIGH (ref 65–99)
Glucose-Capillary: 458 mg/dL — ABNORMAL HIGH (ref 65–99)

## 2015-04-24 LAB — CBC
HCT: 22.7 % — ABNORMAL LOW (ref 40.0–52.0)
Hemoglobin: 7 g/dL — ABNORMAL LOW (ref 13.0–18.0)
MCH: 27.2 pg (ref 26.0–34.0)
MCHC: 30.8 g/dL — AB (ref 32.0–36.0)
MCV: 88.3 fL (ref 80.0–100.0)
PLATELETS: 109 10*3/uL — AB (ref 150–440)
RBC: 2.57 MIL/uL — ABNORMAL LOW (ref 4.40–5.90)
RDW: 16.5 % — AB (ref 11.5–14.5)
WBC: 12.8 10*3/uL — ABNORMAL HIGH (ref 3.8–10.6)

## 2015-04-24 LAB — HEMOGLOBIN AND HEMATOCRIT, BLOOD
HCT: 22.8 % — ABNORMAL LOW (ref 40.0–52.0)
HCT: 22 % — ABNORMAL LOW (ref 40.0–52.0)
HEMATOCRIT: 23.1 % — AB (ref 40.0–52.0)
HEMOGLOBIN: 7 g/dL — AB (ref 13.0–18.0)
Hemoglobin: 7 g/dL — ABNORMAL LOW (ref 13.0–18.0)
Hemoglobin: 6.8 g/dL — ABNORMAL LOW (ref 13.0–18.0)

## 2015-04-24 MED ORDER — HYDROCODONE-ACETAMINOPHEN 5-325 MG PO TABS
1.0000 | ORAL_TABLET | ORAL | Status: DC | PRN
  Administered 2015-04-25 – 2015-04-26 (×2): 1 via ORAL
  Filled 2015-04-24 (×2): qty 1

## 2015-04-24 MED ORDER — LEVOFLOXACIN 500 MG PO TABS
500.0000 mg | ORAL_TABLET | ORAL | Status: DC
  Administered 2015-04-24 – 2015-04-26 (×3): 500 mg via ORAL
  Filled 2015-04-24 (×3): qty 1

## 2015-04-24 MED ORDER — PREDNISONE 50 MG PO TABS
50.0000 mg | ORAL_TABLET | Freq: Every day | ORAL | Status: DC
  Administered 2015-04-24 – 2015-04-25 (×2): 50 mg via ORAL
  Filled 2015-04-24 (×2): qty 1

## 2015-04-24 MED ORDER — INSULIN ASPART 100 UNIT/ML ~~LOC~~ SOLN
0.0000 [IU] | Freq: Three times a day (TID) | SUBCUTANEOUS | Status: DC
  Administered 2015-04-24 – 2015-04-25 (×2): 15 [IU] via SUBCUTANEOUS
  Administered 2015-04-25: 11 [IU] via SUBCUTANEOUS
  Administered 2015-04-25: 15 [IU] via SUBCUTANEOUS
  Administered 2015-04-26: 11 [IU] via SUBCUTANEOUS
  Administered 2015-04-26: 5 [IU] via SUBCUTANEOUS
  Administered 2015-04-26: 11 [IU] via SUBCUTANEOUS
  Administered 2015-04-26: 15 [IU] via SUBCUTANEOUS
  Administered 2015-04-27: 3 [IU] via SUBCUTANEOUS
  Administered 2015-04-27: 8 [IU] via SUBCUTANEOUS
  Filled 2015-04-24 (×3): qty 15
  Filled 2015-04-24: qty 3
  Filled 2015-04-24: qty 8
  Filled 2015-04-24: qty 11
  Filled 2015-04-24: qty 5
  Filled 2015-04-24: qty 11
  Filled 2015-04-24: qty 15
  Filled 2015-04-24: qty 11

## 2015-04-24 MED ORDER — INSULIN ASPART 100 UNIT/ML ~~LOC~~ SOLN
20.0000 [IU] | SUBCUTANEOUS | Status: AC
  Administered 2015-04-24: 20 [IU] via SUBCUTANEOUS
  Filled 2015-04-24: qty 20

## 2015-04-24 NOTE — Progress Notes (Signed)
Assessment completed.  IV Protonix infusing well rt fa.  No distress on 2LO2 per [place].  Lungs diminished bil.  Abdomen with active bowel sounds x 4 quads.  Fall precautions in place.  No active bleeding noted.  Denies chest pain.  CB in reach, SR up x 2, bed alarm on.

## 2015-04-24 NOTE — Care Management Note (Signed)
Case Management Note  Patient Details  Name: [name] MRN: [id number] Date of Birth: [birth year]  Subjective/Objective:  Received TC from  [facility]. They are not able to provide services for patient.                  Action/Plan:   Expected Discharge Date:                  Expected Discharge Plan:     In-House Referral:     Discharge planning Services     Post Acute Care Choice:    Choice offered to:     DME Arranged:    DME Agency:     HH Arranged:    [place] Agency:     Status of Service:     Medicare Important Message Given:  Yes-second notification given Date Medicare IM Given:    Medicare IM give by:    Date Additional Medicare IM Given:    Additional Medicare Important Message give by:     If discussed at [place] of Stay Meetings, dates discussed:    Additional Comments:  Jolly Mango, RN 04/24/2015, 2:21 PM

## 2015-04-24 NOTE — Consult Note (Signed)
Date: 04/24/2015,   MRN# [id number] [name] 1941/06/09 Code Status:     Code Status Orders        Start     Ordered   04/22/15 0450  Full code   Continuous     04/22/15 0450        AdmissionWeight: 248 lb (112.492 kg)                 CurrentWeight: 256 lb 4.8 oz (116.257 kg)   74 yo white male with end stage COPD admitted initally admitted fro GIB s/p EGD now with acute COPD exacerbation. Ct chest may suggest developing pneumonia  I started Patient on  IV abx and Iv steroids and aggressive BD therapy and this seems to have helped patient on last 24 hrs  Will continue current management, NO indication for Bronchoscopy at this time   11   MEDICATIONS    Home Medication:  No current outpatient prescriptions on file.  Current Medication:   Current facility-administered medications:  .  acetaminophen (TYLENOL) tablet 650 mg, 650 mg, Oral, Q6H PRN, 650 mg at 04/23/15 2005 **OR** acetaminophen (TYLENOL) suppository 650 mg, 650 mg, Rectal, Q6H PRN, Harrie Foreman, MD .  albuterol (PROVENTIL) (2.5 MG/3ML) 0.083% nebulizer solution 2.5 mg, 2.5 mg, Nebulization, Q4H, Flora Lipps, MD, 2.5 mg at 04/24/15 1111 .  albuterol (PROVENTIL) (2.5 MG/3ML) 0.083% nebulizer solution 3 mL, 3 mL, Inhalation, Q6H PRN, Harrie Foreman, MD .  ALPRAZolam Duanne Moron) tablet 0.5 mg, 0.5 mg, Oral, TID PRN, Harrie Foreman, MD, 0.5 mg at 04/24/15 0945 .  atorvastatin (LIPITOR) tablet 40 mg, 40 mg, Oral, QHS, Harrie Foreman, MD, 40 mg at 04/23/15 2230 .  bisacodyl (DULCOLAX) EC tablet 5 mg, 5 mg, Oral, Daily PRN, Harrie Foreman, MD .  budesonide (PULMICORT) nebulizer solution 0.5 mg, 0.5 mg, Nebulization, BID, Flora Lipps, MD, 0.5 mg at 04/24/15 0724 .  budesonide-formoterol (SYMBICORT) 160-4.5 MCG/ACT inhaler 2 puff, 2 puff, Inhalation, BID, Harrie Foreman, MD, 2 puff at 04/24/15 [phone] .  citalopram (CELEXA) tablet 40 mg, 40 mg, Oral, Daily, Harrie Foreman, MD, 40 mg at 04/24/15 0915 .   dextromethorphan (DELSYM) 30 MG/5ML liquid 15 mg, 15 mg, Oral, BID PRN, Hillary Bow, MD, 15 mg at 04/24/15 0949 .  divalproex (DEPAKOTE) DR tablet 500 mg, 500 mg, Oral, QHS, Harrie Foreman, MD, 500 mg at 04/23/15 2231 .  docusate sodium (COLACE) capsule 100 mg, 100 mg, Oral, BID, Harrie Foreman, MD, 100 mg at 04/24/15 0916 .  folic acid (FOLVITE) tablet 2 mg, 2 mg, Oral, q morning - 10a, Harrie Foreman, MD, 2 mg at 04/24/15 0915 .  HYDROcodone-acetaminophen (NORCO/VICODIN) 5-325 MG per tablet 1 tablet, 1 tablet, Oral, Q4H PRN, Lance Coon, MD .  insulin aspart (novoLOG) injection 0-15 Units, 0-15 Units, Subcutaneous, TID WC, Harrie Foreman, MD, 15 Units at 04/23/15 1708 .  insulin aspart (novoLOG) injection 5 Units, 5 Units, Subcutaneous, TID WC, Nicholes Mango, MD, 5 Units at 04/24/15 0830 .  insulin glargine (LANTUS) injection 17 Units, 17 Units, Subcutaneous, QHS, Nicholes Mango, MD, 17 Units at 04/23/15 2231 .  levofloxacin (LEVAQUIN) IVPB 500 mg, 500 mg, Intravenous, Q24H, Flora Lipps, MD .  lisinopril (PRINIVIL,ZESTRIL) tablet 5 mg, 5 mg, Oral, Daily, Harrie Foreman, MD, 5 mg at 04/24/15 0916 .  metoprolol tartrate (LOPRESSOR) tablet 25 mg, 25 mg, Oral, BID, Harrie Foreman, MD, 25 mg at 04/24/15 0916 .  ondansetron (ZOFRAN)  tablet 4 mg, 4 mg, Oral, Q6H PRN **OR** ondansetron (ZOFRAN) injection 4 mg, 4 mg, Intravenous, Q6H PRN, Harrie Foreman, MD .  pantoprazole (PROTONIX) 80 mg in sodium chloride 0.9 % 250 mL (0.32 mg/mL) infusion, 8 mg/hr, Intravenous, Continuous, Harrie Foreman, MD, Last Rate: 25 mL/hr at 04/24/15 0057, 8 mg/hr at 04/24/15 0057 .  predniSONE (DELTASONE) tablet 50 mg, 50 mg, Oral, Q breakfast, Nicholes Mango, MD, 50 mg at 04/24/15 0831 .  sodium chloride 0.9 % injection 3 mL, 3 mL, Intravenous, Q12H, Harrie Foreman, MD, 3 mL at 04/24/15 [phone] .  sucralfate (CARAFATE) tablet 1 g, 1 g, Oral, TID WC & HS, Harrie Foreman, MD, 1 g at 04/24/15 0831 .   tiotropium [facility]) inhalation capsule 18 mcg, 18 mcg, Inhalation, Daily, Harrie Foreman, MD, 18 mcg at 04/24/15 0919 .  traZODone (DESYREL) tablet 50 mg, 50 mg, Oral, TID, Harrie Foreman, MD, 50 mg at 04/24/15 0915 .  Vitamin D (Ergocalciferol) (DRISDOL) capsule 50,000 Units, 50,000 Units, Oral, Q7 days, Harrie Foreman, MD, 50,000 Units at 04/22/15 1016 .  zolpidem (AMBIEN) tablet 5 mg, 5 mg, Oral, QHS PRN, Harrie Foreman, MD, 5 mg at 04/23/15 2231     ALLERGIES   Penicillins; Demerol; Dilaudid; and Levaquin     REVIEW OF SYSTEMS   Review of Systems  Constitutional: Positive for malaise/fatigue. Negative for fever, chills and weight loss.  HENT: Negative for hearing loss and tinnitus.   Eyes: Negative for blurred vision, double vision and photophobia.  Respiratory: Positive for sputum production, shortness of breath and wheezing. Negative for cough and hemoptysis.   Cardiovascular: Negative for chest pain and palpitations.  Gastrointestinal: Negative for heartburn, nausea and vomiting.  Genitourinary: Negative for dysuria, urgency and frequency.  Musculoskeletal: Negative for myalgias and neck pain.  Skin: Negative for itching and rash.  Neurological: Positive for weakness. Negative for dizziness, tingling, tremors and headaches.  Endo/Heme/Allergies: Does not bruise/bleed easily.  Psychiatric/Behavioral: Negative for depression, suicidal ideas and substance abuse.     VS: BP 120/55 mmHg  Pulse 95  Temp(Src) 97.7 F (36.5 C) (Oral)  Resp 17  Ht 6\' 2"  (1.88 m)  Wt 256 lb 4.8 oz (116.257 kg)  BMI 32.89 kg/m2  SpO2 96%     PHYSICAL EXAM   Physical Exam  Constitutional: He is oriented to person, place, and time. He appears well-developed. No distress.  HENT:  Head: Normocephalic and atraumatic.  Eyes: Conjunctivae and EOM are normal. Pupils are equal, round, and reactive to light.  Neck: Normal range of motion. Neck supple.  Cardiovascular: Normal rate,  regular rhythm and normal heart sounds.   Pulmonary/Chest: Effort normal. He has wheezes.  Abdominal: Soft. Bowel sounds are normal.  Musculoskeletal: Normal range of motion.  Neurological: He is alert and oriented to person, place, and time.  Skin: Skin is warm and dry. He is not diaphoretic.  Psychiatric: He has a normal mood and affect. His behavior is normal.        LABS    Recent Labs     04/22/15  0052  04/22/15  1525  04/23/15  0410  04/23/15  1441  04/23/15  2308  04/24/15  0257  HGB  6.2*  7.5*  7.3*  7.0*  6.9*  7.0*  HCT  21.2*  24.9*   --   23.1*  22.5*  22.7*  MCV  85.3   --    --    --    --  88.3  WBC  23.4*   --    --    --    --   12.8*  BUN  34*   --    --    --    --   33*  CREATININE  1.11   --    --    --    --   1.25*  GLUCOSE  326*   --    --    --    --   386*  CALCIUM  8.0*   --    --    --    --   8.2*  ,    No results for input(s): PH in the last 72 hours.  Invalid input(s): PCO2, PO2, BASEEXCESS, BASEDEFICITE, TFT    CULTURE RESULTS   No results found for this or any previous visit (from the past 240 hour(s)).        IMAGING    Ct Chest High Resolution  04/23/2015   CLINICAL DATA:  74 year old male with history of cough producing blackish phlegm. History of GI bleeding.  EXAM: CT CHEST WITHOUT CONTRAST  TECHNIQUE: Multidetector CT imaging of the chest was performed following the standard protocol without intravenous contrast. High resolution imaging of the lungs, as well as inspiratory and expiratory imaging, was performed.  COMPARISON:  Chest CT 05/15/2013.  FINDINGS: Mediastinum/Lymph Nodes: Heart size is normal. There is no significant pericardial fluid, thickening or pericardial calcification. There is atherosclerosis of the thoracic aorta, the great vessels of the mediastinum and the coronary arteries, including calcified atherosclerotic plaque in the left main, left anterior descending and right coronary arteries. Status post median  sternotomy for CABG, including LIMA to the LAD. No pathologically enlarged mediastinal or hilar lymph nodes. Please note that accurate exclusion of hilar adenopathy is limited on noncontrast CT scans. Large hiatal hernia. No axillary lymphadenopathy.  Lungs/Pleura: There is some focal architectural distortion, what appears to be chronic volume loss, and some mild peribronchovascular ground-glass attenuation in the left lower lobe, which is favored to reflect a combination of chronic scarring and atelectasis, as well as some peribronchovascular inflammation and mild fibrosis, potentially related to chronic recurrent aspiration in the setting of this large hiatal hernia. High-resolution images otherwise demonstrate no significant additional areas of ground-glass attenuation, subpleural reticulation, parenchymal banding, traction bronchiectasis or frank honeycombing in other portions of the lungs to suggest a background of interstitial lung disease. Inspiratory and expiratory imaging is unremarkable. Mild diffuse bronchial wall thickening with generally mild centrilobular and paraseptal emphysema. There is a large bulla in the apex of the left upper lobe. No acute consolidative airspace disease. No pleural effusions. No suspicious appearing pulmonary nodules or masses.  Upper Abdomen: Unremarkable.  Musculoskeletal/Soft Tissues: Median sternotomy wires. There are no aggressive appearing lytic or blastic lesions noted in the visualized portions of the skeleton.  IMPRESSION: 1. There are no findings to strongly suggest interstitial lung disease on today's examination. 2. There is a spectrum of findings in the left lower lobe, suggestive of some chronic scarring, atelectasis and likely mild fibrosis related to recurrent aspiration or prior infection, as discussed above. 3. Large hiatal hernia. 4. Mild diffuse bronchial wall thickening with mild centrilobular and paraseptal emphysema, as well as a large left apical bulla.  5. Atherosclerosis, including left main and 3 vessel coronary artery disease. Status post median sternotomy for CABG, including LIMA to the LAD.   Electronically Signed   By: Mauri Brooklyn.D.  On: 04/23/2015 13:14      :     ASSESSMENT/PLAN   74 yo white male with acute COPD exacerbation likely from atypical pneumonia/bronchitis  1.oxygen as needed 2.BD therapy with pulmicort and alb nebs 3.dulera and spiriva 4.IV steroids and IV abx-consider changing to oral prednisone 40 mg daily for 7 days and oral Levaquin in next 24-48 hrs 5.no indication for bronch at this time, and patient refuses to under go procedures as well 6.patient to follow up with [facility] as outpatient 7.recommend sleep study as outpatient    I have personally obtained a history, examined the patient, evaluated laboratory and imaging results, formulated the assessment and plan and placed orders.  The Patient requires high complexity decision making for assessment and support, frequent evaluation and titration of therapies, application of advanced monitoring technologies and extensive interpretation of multiple databases. Time Spent with Patient 40 mins   Corrin Parker, M.D. Pulmonary & [place] Director Intensive Care Unit

## 2015-04-24 NOTE — Progress Notes (Signed)
Xanax .5mg  po given per pt request for anxiety.  Pt can not state to RN why he is anxious, "just feel anxious"  Will monitor.

## 2015-04-24 NOTE — Progress Notes (Signed)
Subjective: Denies nausea, vomiting or abdominal pain.  He reports a large melanotic stool this AM can't remember what time.  Objective: Vital signs in last 24 hours: Temp:  [97.4 F (36.3 C)-98.5 F (36.9 C)] 97.4 F (36.3 C) (07/08 1135) Pulse Rate:  [92-105] 93 (07/08 1135) Resp:  [16-20] 20 (07/08 1135) BP: (102-134)/(51-70) 113/53 mmHg (07/08 1135) SpO2:  [93 %-100 %] 95 % (07/08 1135) Weight:  [256 lb 4.8 oz (116.257 kg)] 256 lb 4.8 oz (116.257 kg) (07/08 0432) Last BM Date: 04/23/15 No LMP for male patient. Body mass index is 32.89 kg/(m^2). General:   Alert,  obese, pleasant and cooperative in NAD Head:  Normocephalic and atraumatic. Eyes:  Sclera clear, no icterus.   Conjunctiva pink. Mouth:  No deformity or lesions, oropharynx pink & moist. Neck:  Supple; no masses or thyromegaly. Heart:  Regular rate and rhythm; no murmurs, clicks, rubs, or gallops. Abdomen:   Normal bowel sounds.  Soft, nontender and nondistended. No masses, hepatosplenomegaly or hernias noted.  No guarding or rebound tenderness.   Msk:  Symmetrical without gross deformities. Good equal movement & strength bilaterally. Pulses:  Normal pulses noted. Extremities:  Without clubbing or edema.  No cyanosis Neurologic:  Alert and  oriented x3;  grossly normal neurologically. Skin:  Pale, Intact without significant lesions or rashes. Cervical Nodes:  No significant cervical adenopathy. Psych:  Alert and cooperative. Normal mood and affect.  Intake/Output from previous day: 07/07 0701 - 07/08 0700 In: 636 [P.O.:240; I.V.:96; Blood:300] Out: 850 [Urine:850]  Lab Results:  Recent Labs  04/22/15 0052  04/23/15 1441 04/23/15 2308 04/24/15 0257  WBC 23.4*  --   --   --  12.8*  HGB 6.2*  < > 7.0* 6.9* 7.0*  HCT 21.2*  < > 23.1* 22.5* 22.7*  PLT 208  --   --   --  109*  < > = values in this interval not displayed. BMET  Recent Labs  04/22/15 0052 04/24/15 0257  NA 138 138  K 4.6 5.0  CL 105  104  CO2 24 23  GLUCOSE 326* 386*  BUN 34* 33*  CREATININE 1.11 1.25*  CALCIUM 8.0* 8.2*   LFT  Recent Labs  04/22/15 0052  PROT 5.2*  ALBUMIN 2.8*  AST 19  ALT 13*  ALKPHOS 79  BILITOT 0.3  Studies/Results: Ct Chest High Resolution  04/23/2015   CLINICAL DATA:  74 year old male with history of cough producing blackish phlegm. History of GI bleeding.  EXAM: CT CHEST WITHOUT CONTRAST  TECHNIQUE: Multidetector CT imaging of the chest was performed following the standard protocol without intravenous contrast. High resolution imaging of the lungs, as well as inspiratory and expiratory imaging, was performed.  COMPARISON:  Chest CT 05/15/2013.  FINDINGS: Mediastinum/Lymph Nodes: Heart size is normal. There is no significant pericardial fluid, thickening or pericardial calcification. There is atherosclerosis of the thoracic aorta, the great vessels of the mediastinum and the coronary arteries, including calcified atherosclerotic plaque in the left main, left anterior descending and right coronary arteries. Status post median sternotomy for CABG, including LIMA to the LAD. No pathologically enlarged mediastinal or hilar lymph nodes. Please note that accurate exclusion of hilar adenopathy is limited on noncontrast CT scans. Large hiatal hernia. No axillary lymphadenopathy.  Lungs/Pleura: There is some focal architectural distortion, what appears to be chronic volume loss, and some mild peribronchovascular ground-glass attenuation in the left lower lobe, which is favored to reflect a combination of chronic scarring and atelectasis, as well  as some peribronchovascular inflammation and mild fibrosis, potentially related to chronic recurrent aspiration in the setting of this large hiatal hernia. High-resolution images otherwise demonstrate no significant additional areas of ground-glass attenuation, subpleural reticulation, parenchymal banding, traction bronchiectasis or frank honeycombing in other portions  of the lungs to suggest a background of interstitial lung disease. Inspiratory and expiratory imaging is unremarkable. Mild diffuse bronchial wall thickening with generally mild centrilobular and paraseptal emphysema. There is a large bulla in the apex of the left upper lobe. No acute consolidative airspace disease. No pleural effusions. No suspicious appearing pulmonary nodules or masses.  Upper Abdomen: Unremarkable.  Musculoskeletal/Soft Tissues: Median sternotomy wires. There are no aggressive appearing lytic or blastic lesions noted in the visualized portions of the skeleton.  IMPRESSION: 1. There are no findings to strongly suggest interstitial lung disease on today's examination. 2. There is a spectrum of findings in the left lower lobe, suggestive of some chronic scarring, atelectasis and likely mild fibrosis related to recurrent aspiration or prior infection, as discussed above. 3. Large hiatal hernia. 4. Mild diffuse bronchial wall thickening with mild centrilobular and paraseptal emphysema, as well as a large left apical bulla. 5. Atherosclerosis, including left main and 3 vessel coronary artery disease. Status post median sternotomy for CABG, including LIMA to the LAD.   Electronically Signed   By: Vinnie Langton M.D.   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  Melena this AM may be old blood.  Discussed with attending. Anemia: secondary to #1, Hgb stable as of this AM  Plan: #1  Monitor for recurrent bleeding, recheck H/H 1400 #2 Continue PPI #3 May need repeat intervention if continued bleeding  The care of [name] will be discussed in direct collaboration with Dr [name], Attending Gastroenterologist.  LOS: 2 days  Vickey Huger  04/24/2015, 1:02 PM [facility]  [place], [place] [address] Phone: [phone] Fax : [phone]

## 2015-04-24 NOTE — Progress Notes (Signed)
Dr. Margaretmary Eddy notified about elevated FSBS 418, orders received to give bedtime Lantus now.

## 2015-04-24 NOTE — Progress Notes (Signed)
Found pt's rt fa IV out of arm on floor.  Area cleaned, dry dressing in place.  Protonix gtt infusing lt fa now.

## 2015-04-24 NOTE — Progress Notes (Signed)
Inpatient Diabetes Program Recommendations  AACE/ADA: New Consensus Statement on Inpatient Glycemic Control (2013)  Target Ranges:  Prepandial:   less than 140 mg/dL      Peak postprandial:   less than 180 mg/dL (1-2 hours)      Critically ill patients:  140 - 180 mg/dL   Results for KILE, KABLER (MRN [id number]) as of 04/24/2015 11:09  Ref. Range 04/23/2015 11:44 04/23/2015 16:25 04/23/2015 20:43 04/24/2015 06:30 04/24/2015 07:16  Glucose-Capillary Latest Ref Range: 65-99 mg/dL 288 (H) 397 (H) 426 (H) 450 (H) 423 (H)    Reason for assessment: elevated blood sugar  Diabetes history: Type 2 Outpatient Diabetes medications: Lantus 12 units daily, Novolog 0-15 units tid with meals and hs, Novolog 5 units tid with meals, Metformin 500mg /day Current orders for Inpatient glycemic control: Lantus 10 units daily, Novolog 0-15 units tid  Consider increasing to Novolog 10 units tid with meals   Consider increasing Novolog correction to resistant scale, 0-20 units q4h  Gentry Fitz, RN, [place], [place], CDE Diabetes Coordinator Inpatient Diabetes Program  [phone] (Team Pager) [phone] ([place]) 04/24/2015 11:11 AM

## 2015-04-24 NOTE — Care Management (Signed)
Important Message  Patient Details  Name: [name] MRN: [id number] Date of Birth: Feb 08, 1941   Medicare Important Message Given:  Yes-second notification given    Juliann Pulse A Allmond 04/24/2015, 9:50 AM

## 2015-04-24 NOTE — Progress Notes (Signed)
She iss a [name] [place] NAME: [name]    MR#:  [id number]  DATE OF BIRTH:  1940-11-03  SUBJECTIVE:  CHIEF COMPLAINT:   Chief Complaint  Patient presents with  . Respiratory Distress  . Melena   Had melena , status  Post APC of angiectasia Reporting no more  coughing up blackish phlegm today  Reporting large BM with melena and this a.m., not witnessed by RN No vomiting. Diffuse abd pain, still feeling sick and not feeling better  REVIEW OF SYSTEMS:    Review of Systems  Constitutional: Positive for weight loss. Negative for fever and chills.  HENT: Negative for sore throat.   Eyes: Negative for blurred vision, double vision and pain.  Respiratory: Positive for shortness of breath (chronic). Negative for cough, hemoptysis and wheezing.   Cardiovascular: Negative for chest pain, palpitations, orthopnea and leg swelling.  Gastrointestinal: Positive for abdominal pain and melena. Negative for heartburn, nausea, vomiting, diarrhea, constipation and blood in stool.  Genitourinary: Negative for dysuria and hematuria.  Musculoskeletal: Negative for back pain and joint pain.  Skin: Negative for rash.  Neurological: Positive for weakness. Negative for sensory change, speech change, focal weakness and headaches.  Endo/Heme/Allergies: Does not bruise/bleed easily.  Psychiatric/Behavioral: Positive for depression. The patient is not nervous/anxious.       DRUG ALLERGIES:   Allergies  Allergen Reactions  . Penicillins Anaphylaxis and Hives  . Demerol [Meperidine] Other (See Comments)    hallucinations  . Dilaudid [Hydromorphone Hcl] Other (See Comments)    hallucinations  . Levaquin [Levofloxacin In D5w]     Nausea and diarrhea    VITALS:  Blood pressure 113/53, pulse 93, temperature 97.4 F (36.3 C), temperature source Oral, resp. rate 20, height 6\' 2"  (1.88 m), weight 116.257 kg (256 lb 4.8 oz), SpO2 95  %.  PHYSICAL EXAMINATION:   Physical Exam  GENERAL:  74 y.o.-year-old patient lying in the bed with no acute distress. Ple EYES: Pupils equal, round, reactive to light and accommodation. No scleral icterus. Extraocular muscles intact.  HEENT: Head atraumatic, normocephalic. Oropharynx and nasopharynx clear.  NECK:  Supple, no jugular venous distention. No thyroid enlargement, no tenderness.  LUNGS: Normal breath sounds bilaterally, no wheezing, rales, rhonchi. No use of accessory muscles of respiration.  CARDIOVASCULAR: S1, S2 normal. No murmurs, rubs, or gallops.  ABDOMEN: Soft, nontender, distended. Bowel sounds present. No organomegaly or mass.  EXTREMITIES: No cyanosis, clubbing or edema b/l.    NEUROLOGIC: Cranial nerves II through XII are intact. No focal Motor or sensory deficits b/l.   PSYCHIATRIC: The patient is alert and oriented x 3.  SKIN: No obvious rash, lesion, or ulcer.    LABORATORY PANEL:   CBC  Recent Labs Lab 04/24/15 0257 04/24/15 1410  WBC 12.8*  --   HGB 7.0* 7.0*  HCT 22.7* 22.8*  PLT 109*  --    ------------------------------------------------------------------------------------------------------------------  Chemistries   Recent Labs Lab 04/22/15 0052 04/22/15 1525 04/24/15 0257  NA 138  --  138  K 4.6  --  5.0  CL 105  --  104  CO2 24  --  23  GLUCOSE 326*  --  386*  BUN 34*  --  33*  CREATININE 1.11  --  1.25*  CALCIUM 8.0*  --  8.2*  MG  --  2.1  --   AST 19  --   --   ALT 13*  --   --  ALKPHOS 79  --   --   BILITOT 0.3  --   --    ------------------------------------------------------------------------------------------------------------------  Cardiac Enzymes  Recent Labs Lab 04/22/15 0052  TROPONINI 0.03   ------------------------------------------------------------------------------------------------------------------  RADIOLOGY:  Ct Chest High Resolution  04/23/2015   CLINICAL DATA:  74 year old male with history of  cough producing blackish phlegm. History of GI bleeding.  EXAM: CT CHEST WITHOUT CONTRAST  TECHNIQUE: Multidetector CT imaging of the chest was performed following the standard protocol without intravenous contrast. High resolution imaging of the lungs, as well as inspiratory and expiratory imaging, was performed.  COMPARISON:  Chest CT 05/15/2013.  FINDINGS: Mediastinum/Lymph Nodes: Heart size is normal. There is no significant pericardial fluid, thickening or pericardial calcification. There is atherosclerosis of the thoracic aorta, the great vessels of the mediastinum and the coronary arteries, including calcified atherosclerotic plaque in the left main, left anterior descending and right coronary arteries. Status post median sternotomy for CABG, including LIMA to the LAD. No pathologically enlarged mediastinal or hilar lymph nodes. Please note that accurate exclusion of hilar adenopathy is limited on noncontrast CT scans. Large hiatal hernia. No axillary lymphadenopathy.  Lungs/Pleura: There is some focal architectural distortion, what appears to be chronic volume loss, and some mild peribronchovascular ground-glass attenuation in the left lower lobe, which is favored to reflect a combination of chronic scarring and atelectasis, as well as some peribronchovascular inflammation and mild fibrosis, potentially related to chronic recurrent aspiration in the setting of this large hiatal hernia. High-resolution images otherwise demonstrate no significant additional areas of ground-glass attenuation, subpleural reticulation, parenchymal banding, traction bronchiectasis or frank honeycombing in other portions of the lungs to suggest a background of interstitial lung disease. Inspiratory and expiratory imaging is unremarkable. Mild diffuse bronchial wall thickening with generally mild centrilobular and paraseptal emphysema. There is a large bulla in the apex of the left upper lobe. No acute consolidative airspace  disease. No pleural effusions. No suspicious appearing pulmonary nodules or masses.  Upper Abdomen: Unremarkable.  Musculoskeletal/Soft Tissues: Median sternotomy wires. There are no aggressive appearing lytic or blastic lesions noted in the visualized portions of the skeleton.  IMPRESSION: 1. There are no findings to strongly suggest interstitial lung disease on today's examination. 2. There is a spectrum of findings in the left lower lobe, suggestive of some chronic scarring, atelectasis and likely mild fibrosis related to recurrent aspiration or prior infection, as discussed above. 3. Large hiatal hernia. 4. Mild diffuse bronchial wall thickening with mild centrilobular and paraseptal emphysema, as well as a large left apical bulla. 5. Atherosclerosis, including left main and 3 vessel coronary artery disease. Status post median sternotomy for CABG, including LIMA to the LAD.   Electronically Signed   By: Vinnie Langton M.D.   On: 04/23/2015 13:14     ASSESSMENT AND PLAN:   * GI bleed. Has had both upper and lower GI bleed with multiple endoscopies. Discussed with GI. EGD 7/6, status post APC of angiectasia . Protonix . Serial Hb, transfuse as needed if hemoglobin is less than or equal to 7 May need repeat intervention if continued bleeding per gi   * Acute blood loss Anemia Transfused 2 units of packed red blood cells Repeat Hb after transfusion.  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 Will start lower dose. SSI  * Coronary artery disease: Stable. Status post CABG  * Peripheral artery disease: Status post aortofemoral stent placement  * COPD with chronic resp failure/reporting blackish phlegm 2/2 atypical PNA/bronchitis High-resolution CT chest  is not impressive Pulmonology consult , patient sees [facility] as an outpatient On PRN Nebs and inhalers. Continue O2. Po levoflox, po and steroids  * Generalized weaknes PT consult for deconditioning   All the records are  reviewed and case discussed with Care Management/Social Workerr. Management plans discussed with the patient, family and they are in agreement.  CODE STATUS: FULL CODE  DVT Prophylaxis: SCDs  TOTAL TIME TAKING CARE OF THIS PATIENT: 35 minutes.    Nicholes Mango M.D on 04/24/2015 at 3:20 PM  Between 7am to 6pm - Pager - [phone]  After 6pm go to [email or web address] - password [email or web address] [facility]  Office  [phone]  CC: Primary care physician; Dustin Flock, MD

## 2015-04-24 NOTE — Progress Notes (Signed)
Dr. Margaretmary Eddy made aware of FSBS 423, orders received

## 2015-04-25 LAB — PREPARE RBC (CROSSMATCH)

## 2015-04-25 LAB — GLUCOSE, CAPILLARY
Glucose-Capillary: 370 mg/dL — ABNORMAL HIGH (ref 65–99)
Glucose-Capillary: 450 mg/dL — ABNORMAL HIGH (ref 65–99)
Glucose-Capillary: 382 mg/dL — ABNORMAL HIGH (ref 65–99)
Glucose-Capillary: 321 mg/dL — ABNORMAL HIGH (ref 65–99)
Glucose-Capillary: 409 mg/dL — ABNORMAL HIGH (ref 65–99)
Glucose-Capillary: 440 mg/dL — ABNORMAL HIGH (ref 65–99)

## 2015-04-25 LAB — CBC
HEMATOCRIT: 22.2 % — AB (ref 40.0–52.0)
HEMOGLOBIN: 7.1 g/dL — AB (ref 13.0–18.0)
MCH: 28.1 pg (ref 26.0–34.0)
MCHC: 31.9 g/dL — ABNORMAL LOW (ref 32.0–36.0)
MCV: 88.1 fL (ref 80.0–100.0)
Platelets: 119 10*3/uL — ABNORMAL LOW (ref 150–440)
RBC: 2.52 MIL/uL — ABNORMAL LOW (ref 4.40–5.90)
RDW: 17.1 % — ABNORMAL HIGH (ref 11.5–14.5)
WBC: 10.8 10*3/uL — AB (ref 3.8–10.6)

## 2015-04-25 MED ORDER — PREDNISONE 20 MG PO TABS
20.0000 mg | ORAL_TABLET | Freq: Every day | ORAL | Status: DC
  Administered 2015-04-26 – 2015-04-27 (×2): 20 mg via ORAL
  Filled 2015-04-25 (×2): qty 1

## 2015-04-25 MED ORDER — INSULIN GLARGINE 100 UNIT/ML ~~LOC~~ SOLN
10.0000 [IU] | Freq: Once | SUBCUTANEOUS | Status: AC
  Administered 2015-04-25: 10 [IU] via SUBCUTANEOUS
  Filled 2015-04-25: qty 0.1

## 2015-04-25 MED ORDER — INSULIN ASPART 100 UNIT/ML ~~LOC~~ SOLN
20.0000 [IU] | Freq: Once | SUBCUTANEOUS | Status: AC
  Administered 2015-04-25: 20 [IU] via SUBCUTANEOUS
  Filled 2015-04-25: qty 20

## 2015-04-25 MED ORDER — SODIUM CHLORIDE 0.9 % IV SOLN
Freq: Once | INTRAVENOUS | Status: AC
  Administered 2015-04-25: 15:00:00 via INTRAVENOUS

## 2015-04-25 MED ORDER — PANTOPRAZOLE SODIUM 40 MG PO TBEC
40.0000 mg | DELAYED_RELEASE_TABLET | Freq: Two times a day (BID) | ORAL | Status: DC
  Administered 2015-04-25 – 2015-04-27 (×5): 40 mg via ORAL
  Filled 2015-04-25 (×5): qty 1

## 2015-04-25 NOTE — Plan of Care (Signed)
Problem: Phase I Progression Outcomes Goal: OOB as tolerated unless otherwise ordered Outcome: Progressing Up to side of the bed

## 2015-04-25 NOTE — Progress Notes (Signed)
MD Gouru was ntoified of FS 450, 10 units lantus SQ once and 20 units of novolog once

## 2015-04-25 NOTE — Evaluation (Signed)
Physical Therapy Evaluation Patient Details Name: [name] MRN: [id number] DOB: [birth year] Today's Date: 04/25/2015   History of Present Illness  74 yo male with GI bleed s/p APC of angiectasia.  Clinical Impression  Pt initially agitated but calmed throughout session and appeared relaxed and comfortable at end.  Pt O2 tubing extended to allow pt to walk to bathroom.  Pt became SOB after 15' x2 walk and recovered after 1 min seated rest to normal breathing pattern.  Pt demonstrated decreased safety with RW and decreased overall dynamic standing balance.  Pt would benefit from PT to address objective findings.    Follow Up Recommendations Home health PT    Equipment Recommendations  None recommended by PT    Recommendations for Other Services       Precautions / Restrictions Precautions Precautions: None      Mobility  Bed Mobility Overal bed mobility: Modified Independent             General bed mobility comments: Supine to sit swining legs off bed with HOB elevated and using bed rails.  Transfers Overall transfer level: Modified independent Equipment used: Rolling walker (2 wheeled) Transfers: Sit to/from Stand Sit to Stand: Modified independent (Device/Increase time)         General transfer comment: pushing up from bed   Ambulation/Gait Ambulation/Gait assistance: Supervision Ambulation Distance (Feet): 15 Feet (x2) Assistive device: Rolling walker (2 wheeled) Gait Pattern/deviations: Step-through pattern;Wide base of support Gait velocity: reduced   General Gait Details: slight unsteady; reaching for wall/grab bars in bathroom  Stairs            Wheelchair Mobility    Modified Rankin (Stroke Patients Only)       Balance Overall balance assessment: Modified Independent Sitting-balance support: Feet supported Sitting balance-Leahy Scale: Good   Postural control: Posterior lean Standing balance support: Bilateral upper extremity  supported Standing balance-Leahy Scale: Good                               Pertinent Vitals/Pain      Home Living Family/patient expects to be discharged to:: Private residence Living Arrangements: Spouse/significant other Available Help at Discharge: Family Type of Home: House Home Access: Stairs to enter Entrance Stairs-Rails: Left Entrance Stairs-Number of Steps: 2 Home Layout: One level Home Equipment: Walker - 2 wheels;Shower seat      Prior Function Level of Independence: Independent with assistive device(s)         Comments: Has a walker that he uses as needed.     Hand Dominance   Dominant Hand: Right    Extremity/Trunk Assessment   Upper Extremity Assessment: Overall WFL for tasks assessed           Lower Extremity Assessment: Generalized weakness      Cervical / Trunk Assessment: Normal  Communication   Communication: Other (comment) (LIkes to talk; doesn't like to be interrupted)  Cognition Arousal/Alertness: Awake/alert Behavior During Therapy:  (Slight aggitation but able to difuse with decreasing verbal requests and letting pt talk.  Pt plesant at end of session and grateful for PT.) Overall Cognitive Status: Within Functional Limits for tasks assessed                      General Comments General comments (skin integrity, edema, etc.): Pt eager to get up out of bed and get to chair, has been waitng for PT all day and  was told not to move without PT.    Exercises Other Exercises Other Exercises: AP, WS, SLR, and HS x 10 reps      Assessment/Plan    PT Assessment Patient needs continued PT services  PT Diagnosis Difficulty walking;Generalized weakness   PT Problem List Decreased strength;Decreased range of motion;Decreased activity tolerance;Decreased balance;Decreased mobility;Decreased coordination;Decreased knowledge of use of DME;Cardiopulmonary status limiting activity;Obesity  PT Treatment Interventions DME  instruction;Gait training;Stair training;Functional mobility training;Therapeutic activities;Therapeutic exercise;Balance training;Patient/family education   PT Goals (Current goals can be found in the Care Plan section) Acute Rehab PT Goals Patient Stated Goal: "I was able to walk 10 x as far last week." PT Goal Formulation: With patient Time For Goal Achievement: 05/09/15 Potential to Achieve Goals: Fair    Frequency Min 2X/week   Barriers to discharge Inaccessible home environment;Decreased caregiver support wife unable to assist    Co-evaluation               End of Session Equipment Utilized During Treatment: Gait belt Activity Tolerance: Patient tolerated treatment well;Patient limited by fatigue Patient left: in chair;with chair alarm set Nurse Communication: Mobility status         Time: 8295-6213 PT Time Calculation (min) (ACUTE ONLY): 30 min   Charges:   PT Evaluation $Initial PT Evaluation Tier I: 1 Procedure PT Treatments $Gait Training: 8-22 mins $Therapeutic Activity: 8-22 mins   PT G Codes:        Ashani Pumphrey A Jessicia Napolitano 2015-05-10, 4:03 PM

## 2015-04-25 NOTE — Progress Notes (Signed)
She iss a [place] at [place] NAME: Meril Dray    MR#:  [id number]  DATE OF BIRTH:  Nov 29, 1940  SUBJECTIVE:  CHIEF COMPLAINT:   Chief Complaint  Patient presents with  . Respiratory Distress  . Melena   Had melena , status  Post APC of angiectasia Still reporting melena  Diffuse abd pain, still feeling sick and not feeling better  REVIEW OF SYSTEMS:    Review of Systems  Constitutional: Positive for weight loss. Negative for fever and chills.  HENT: Negative for sore throat.   Eyes: Negative for blurred vision, double vision and pain.  Respiratory: Positive for shortness of breath (chronic). Negative for cough, hemoptysis and wheezing.   Cardiovascular: Negative for chest pain, palpitations, orthopnea and leg swelling.  Gastrointestinal: Positive for abdominal pain and melena. Negative for heartburn, nausea, vomiting, diarrhea, constipation and blood in stool.  Genitourinary: Negative for dysuria and hematuria.  Musculoskeletal: Negative for back pain and joint pain.  Skin: Negative for rash.  Neurological: Positive for weakness. Negative for sensory change, speech change, focal weakness and headaches.  Endo/Heme/Allergies: Does not bruise/bleed easily.  Psychiatric/Behavioral: Positive for depression. The patient is not nervous/anxious.       DRUG ALLERGIES:   Allergies  Allergen Reactions  . Penicillins Anaphylaxis and Hives  . Demerol [Meperidine] Other (See Comments)    hallucinations  . Dilaudid [Hydromorphone Hcl] Other (See Comments)    hallucinations  . Levaquin [Levofloxacin In D5w]     Nausea and diarrhea    VITALS:  Blood pressure 130/64, pulse 89, temperature 98.4 F (36.9 C), temperature source Oral, resp. rate 20, height 6\' 2"  (1.88 m), weight 117.527 kg (259 lb 1.6 oz), SpO2 100 %.  PHYSICAL EXAMINATION:   Physical Exam  GENERAL:  74 y.o.-year-old patient lying in the bed with no acute  distress. Ple EYES: Pupils equal, round, reactive to light and accommodation. No scleral icterus. Extraocular muscles intact.  HEENT: Head atraumatic, normocephalic. Oropharynx and nasopharynx clear.  NECK:  Supple, no jugular venous distention. No thyroid enlargement, no tenderness.  LUNGS: Normal breath sounds bilaterally, no wheezing, rales, rhonchi. No use of accessory muscles of respiration.  CARDIOVASCULAR: S1, S2 normal. No murmurs, rubs, or gallops.  ABDOMEN: Soft, nontender, distended. Bowel sounds present. No organomegaly or mass.  EXTREMITIES: No cyanosis, clubbing or edema b/l.    NEUROLOGIC: Cranial nerves II through XII are intact. No focal Motor or sensory deficits b/l.   PSYCHIATRIC: The patient is alert and oriented x 3.  SKIN: No obvious rash, lesion, or ulcer.    LABORATORY PANEL:   CBC  Recent Labs Lab 04/25/15 0316  WBC 10.8*  HGB 7.1*  HCT 22.2*  PLT 119*   ------------------------------------------------------------------------------------------------------------------  Chemistries   Recent Labs Lab 04/22/15 0052 04/22/15 1525 04/24/15 0257  NA 138  --  138  K 4.6  --  5.0  CL 105  --  104  CO2 24  --  23  GLUCOSE 326*  --  386*  BUN 34*  --  33*  CREATININE 1.11  --  1.25*  CALCIUM 8.0*  --  8.2*  MG  --  2.1  --   AST 19  --   --   ALT 13*  --   --   ALKPHOS 79  --   --   BILITOT 0.3  --   --    ------------------------------------------------------------------------------------------------------------------  Cardiac Enzymes  Recent Labs Lab 04/22/15  0052  TROPONINI 0.03   ------------------------------------------------------------------------------------------------------------------  RADIOLOGY:  No results found.   ASSESSMENT AND PLAN:   * GI bleed. 1 unit of blood transition today for symptomatically anemia Has had both upper and lower GI bleed with multiple endoscopies. Discussed with GI. EGD 7/6, status post APC of  angiectasia . Protonix drip is discontinued will start Protonix by mouth twice a day . Serial Hb, transfuse as needed if hemoglobin is less than or equal to 7 May need repeat intervention if continued bleeding per gi   * Acute blood loss Anemia Transfused 2 units of packed red blood cells, will repeat one unit of blood transfusion today Repeat Hb after transfusion.  * Uncontrolled diabetes mellitus On lantus and titrate as needed, Will start lower dose. SSI  * Coronary artery disease: Stable. Status post CABG  * Peripheral artery disease: Status post aortofemoral stent placement  * COPD with chronic resp failure/reporting blackish phlegm 2/2 atypical PNA/bronchitis High-resolution CT chest is not impressive Pulmonology consult , patient sees [facility] as an outpatient On PRN Nebs and inhalers. Continue O2. Po levoflox, po and steroids  * Generalized weaknes PT consult for deconditioning   All the records are reviewed and case discussed with Care Management/Social Workerr. Management plans discussed with the patient, family and they are in agreement.  CODE STATUS: FULL CODE  DVT Prophylaxis: SCDs  TOTAL TIME TAKING CARE OF THIS PATIENT: 35 minutes.    Nicholes Mango M.D on 04/25/2015 at 3:13 PM  Between 7am to 6pm - Pager - [phone]  After 6pm go to [email or web address] - password [email or web address] [facility]  Office  [phone]  CC: Primary care physician; Dustin Flock, MD

## 2015-04-25 NOTE — Progress Notes (Signed)
A & O. FS are elevated. MD was made aware.Hbg 7.1 and pt to receive 1 unit of blood. 2 L of oxygen. NSR. Takes meds ok. Pt complained of HA and received percocet. Worked with PT and walked to BR and tolerated it well. Pt has no further concerns at this time.

## 2015-04-26 LAB — TYPE AND SCREEN
ABO/RH(D): B NEG
Antibody Screen: NEGATIVE
UNIT DIVISION: 0
UNIT DIVISION: 0
UNIT DIVISION: 0
Unit division: 0

## 2015-04-26 LAB — PREPARE RBC (CROSSMATCH)

## 2015-04-26 LAB — GLUCOSE, CAPILLARY
Glucose-Capillary: 230 mg/dL — ABNORMAL HIGH (ref 65–99)
Glucose-Capillary: 316 mg/dL — ABNORMAL HIGH (ref 65–99)
Glucose-Capillary: 308 mg/dL — ABNORMAL HIGH (ref 65–99)
Glucose-Capillary: 366 mg/dL — ABNORMAL HIGH (ref 65–99)

## 2015-04-26 LAB — HEMOGLOBIN AND HEMATOCRIT, BLOOD
HCT: 27.1 % — ABNORMAL LOW (ref 40.0–52.0)
HEMOGLOBIN: 8.8 g/dL — AB (ref 13.0–18.0)

## 2015-04-26 LAB — CBC
HEMATOCRIT: 22.3 % — AB (ref 40.0–52.0)
HEMOGLOBIN: 7.2 g/dL — AB (ref 13.0–18.0)
MCH: 28.4 pg (ref 26.0–34.0)
MCHC: 32.6 g/dL (ref 32.0–36.0)
MCV: 87.3 fL (ref 80.0–100.0)
Platelets: 110 10*3/uL — ABNORMAL LOW (ref 150–440)
RBC: 2.55 MIL/uL — AB (ref 4.40–5.90)
RDW: 16.1 % — AB (ref 11.5–14.5)
WBC: 6.1 10*3/uL (ref 3.8–10.6)

## 2015-04-26 MED ORDER — SODIUM CHLORIDE 0.9 % IV SOLN
Freq: Once | INTRAVENOUS | Status: AC
  Administered 2015-04-26: 13:00:00 via INTRAVENOUS

## 2015-04-26 NOTE — Progress Notes (Signed)
Blood sugar 409, Dr. Jannifer Franklin notified, orders given to continue with sliding scale and lantus coverage. Will continue to monitor patient.

## 2015-04-26 NOTE — Progress Notes (Signed)
ANTIBIOTIC CONSULT NOTE - Follow Up  Pharmacy Consult for Levofloxacin Indication: pneumonia  Allergies  Allergen Reactions  . Penicillins Anaphylaxis and Hives  . Demerol [Meperidine] Other (See Comments)    hallucinations  . Dilaudid [Hydromorphone Hcl] Other (See Comments)    hallucinations  . Levaquin [Levofloxacin In D5w]     Nausea and diarrhea    Patient Measurements: Height: 6\' 2"  (188 cm) Weight: 259 lb 1.6 oz (117.527 kg) IBW/kg (Calculated) : 82.2   Vital Signs: Temp: 97.5 F (36.4 C) (07/10 0450) Temp Source: Oral (07/10 0450) BP: 115/69 mmHg (07/10 1151) Pulse Rate: 87 (07/10 1151) Intake/Output from previous day: 07/09 0701 - 07/10 0700 In: 520 [P.O.:120; I.V.:100; Blood:300] Out: 2850 [Urine:2850] Intake/Output from this shift: Total I/O In: 0  Out: 625 [Urine:625]  Labs:  Recent Labs  04/24/15 0257  04/24/15 2005 04/25/15 0316 04/26/15 0436  WBC 12.8*  --   --  10.8* 6.1  HGB 7.0*  < > 6.8* 7.1* 7.2*  PLT 109*  --   --  119* 110*  CREATININE 1.25*  --   --   --   --   < > = values in this interval not displayed. Estimated Creatinine Clearance: 71.7 mL/min (by C-G formula based on Cr of 1.25). No results for input(s): VANCOTROUGH, VANCOPEAK, VANCORANDOM, GENTTROUGH, GENTPEAK, GENTRANDOM, TOBRATROUGH, TOBRAPEAK, TOBRARND, AMIKACINPEAK, AMIKACINTROU, AMIKACIN in the last 72 hours.   Microbiology: No results found for this or any previous visit (from the past 720 hour(s)).  Medical History: Past Medical History  Diagnosis Date  . Allergic rhinitis, cause unspecified   . Other and unspecified hyperlipidemia   . 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 Unspecified essential hypertension   . 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 Severe bullous disease of LUL. Prolonged intubation after surgeries due to COPD  . AAA (abdominal aortic aneurysm)  12/2008    7cm, endovascular repair with coiling right hypogastric artery   . Anemia     Recurrent microcytic, presumably GI   . Complication of anesthesia     trouble getting off ventilator  . Memory loss   . Diabetes   . CHF (congestive heart failure)   . CAD (coronary artery disease)   . GI bleed requiring more than 4 units of blood in 24 hours, ICU, or surgery     Hx bleeding gastric polyps, cecal & sigmoid AVMS s/p APC 03/30/14  . AVM (arteriovenous malformation) of colon with hemorrhage   . Multiple gastric polyps   . Diverticulosis     Medications:  Scheduled:  . sodium chloride   Intravenous Once  . albuterol  2.5 mg Nebulization Q4H  . atorvastatin  40 mg Oral QHS  . budesonide (PULMICORT) nebulizer solution  0.5 mg Nebulization BID  . budesonide-formoterol  2 puff Inhalation BID  . citalopram  40 mg Oral Daily  . divalproex  500 mg Oral QHS  . docusate sodium  100 mg Oral BID  . folic acid  2 mg Oral q morning - 10a  . insulin aspart  0-15 Units Subcutaneous TID AC & HS  . insulin aspart  5 Units Subcutaneous TID WC  . insulin glargine  17 Units Subcutaneous QHS  . levofloxacin  500 mg Oral Q24H  . lisinopril  5 mg Oral Daily  . metoprolol tartrate  25 mg Oral BID  . pantoprazole  40 mg Oral BID  . predniSONE  20 mg Oral Q breakfast  . sodium chloride  3 mL Intravenous Q12H  . sucralfate  1 g Oral TID WC & HS  . tiotropium  18 mcg Inhalation Daily  . traZODone  50 mg Oral TID  . Vitamin D (Ergocalciferol)  50,000 Units Oral Q7 days   Assessment: Patient being treated with Levofloxacin for CAP.   Goal of Therapy:  Improve symptoms  Plan:  Will continue Levaquin 500 mg po daily.   Follow up culture results  Mont Jagoda G 04/26/2015,12:07 PM

## 2015-04-26 NOTE — Progress Notes (Signed)
VSS. Room air. NSR. Pt has not reported any pain. NG tube. TPN @70 . Foley. Flexiseal. Family at the bedside. Follows commads. Pt has no further concerns at this time.

## 2015-04-26 NOTE — Progress Notes (Signed)
GI Inpatient Follow-up Note  Patient Identification: [name] is a 74 y.o. male with melena, anemia  Subjective:  Getting 1U prbc now.  Reports ongoign melena, but only one time per day.  + Abd pain. No f/ c, n/v.   Scheduled Inpatient Medications:  . albuterol  2.5 mg Nebulization Q4H  . atorvastatin  40 mg Oral QHS  . budesonide (PULMICORT) nebulizer solution  0.5 mg Nebulization BID  . budesonide-formoterol  2 puff Inhalation BID  . citalopram  40 mg Oral Daily  . divalproex  500 mg Oral QHS  . docusate sodium  100 mg Oral BID  . folic acid  2 mg Oral q morning - 10a  . insulin aspart  0-15 Units Subcutaneous TID AC & HS  . insulin aspart  5 Units Subcutaneous TID WC  . insulin glargine  17 Units Subcutaneous QHS  . levofloxacin  500 mg Oral Q24H  . lisinopril  5 mg Oral Daily  . metoprolol tartrate  25 mg Oral BID  . pantoprazole  40 mg Oral BID  . predniSONE  20 mg Oral Q breakfast  . sodium chloride  3 mL Intravenous Q12H  . sucralfate  1 g Oral TID WC & HS  . tiotropium  18 mcg Inhalation Daily  . traZODone  50 mg Oral TID  . Vitamin D (Ergocalciferol)  50,000 Units Oral Q7 days    Continuous Inpatient Infusions:     PRN Inpatient Medications:  acetaminophen **OR** acetaminophen, albuterol, ALPRAZolam, bisacodyl, dextromethorphan, HYDROcodone-acetaminophen, ondansetron **OR** ondansetron (ZOFRAN) IV, zolpidem    Physical Examination: BP 121/64 mmHg  Pulse 94  Temp(Src) 98.2 F (36.8 C) (Oral)  Resp 20  Ht 6\' 2"  (1.88 m)  Wt 117.527 kg (259 lb 1.6 oz)  BMI 33.25 kg/m2  SpO2 98% Gen: NAD, alert and oriented x 4 Neck: supple, no JVD or thyromegaly Chest: CTA bilaterally, no wheezes, crackles, or other adventitious sounds CV: RRR, no m/g/c/r Abd: +obese, mildly distended, mild diffuse TTP, no r/g, nabs.  Ext: no edema, well perfused with 2+ pulses, Lymph: no LAD  Data: Lab Results  Component Value Date   WBC 6.1 04/26/2015   HGB 7.2* 04/26/2015    HCT 22.3* 04/26/2015   MCV 87.3 04/26/2015   PLT 110* 04/26/2015    Recent Labs Lab 04/24/15 2005 04/25/15 0316 04/26/15 0436  HGB 6.8* 7.1* 7.2*   Lab Results  Component Value Date   NA 138 04/24/2015   K 5.0 04/24/2015   CL 104 04/24/2015   CO2 23 04/24/2015   BUN 33* 04/24/2015   CREATININE 1.25* 04/24/2015   Lab Results  Component Value Date   ALT 13* 04/22/2015   AST 19 04/22/2015   ALKPHOS 79 04/22/2015   BILITOT 0.3 04/22/2015   No results for input(s): APTT, INR, PTT in the last 168 hours.   Assessment/Plan: [name] is a 74 y.o. male with melena, anemia.  Ongoign melena, once per day. No response to prbc.  Will likely need further endoscopic w/u if melena continues and Hgb not improving with prbc.  Dr Allen Norris and Tamsen Snider to resume care tomorrow.    Please call with questions or concerns.  Alona Danford, Grace Blight, MD

## 2015-04-26 NOTE — Progress Notes (Signed)
She iss a [facility] NAME: [name]    MR#:  [id number]  DATE OF BIRTH:  [birth year]  SUBJECTIVE:  CHIEF COMPLAINT:   Chief Complaint  Patient presents with  . Respiratory Distress  . Melena   Had melena , status  Post APC of angiectasia Still reporting melena  Diffuse abd pain, still feeling sick and not feeling better  REVIEW OF SYSTEMS:    Review of Systems  Constitutional: Positive for weight loss. Negative for fever and chills.  HENT: Negative for sore throat.   Eyes: Negative for blurred vision, double vision and pain.  Respiratory: Positive for shortness of breath (chronic). Negative for cough, hemoptysis and wheezing.   Cardiovascular: Negative for chest pain, palpitations, orthopnea and leg swelling.  Gastrointestinal: Positive for abdominal pain and melena. Negative for heartburn, nausea, vomiting, diarrhea, constipation and blood in stool.  Genitourinary: Negative for dysuria and hematuria.  Musculoskeletal: Negative for back pain and joint pain.  Skin: Negative for rash.  Neurological: Positive for weakness. Negative for sensory change, speech change, focal weakness and headaches.  Endo/Heme/Allergies: Does not bruise/bleed easily.  Psychiatric/Behavioral: Positive for depression. The patient is not nervous/anxious.       DRUG ALLERGIES:   Allergies  Allergen Reactions  . Penicillins Anaphylaxis and Hives  . Demerol [Meperidine] Other (See Comments)    hallucinations  . Dilaudid [Hydromorphone Hcl] Other (See Comments)    hallucinations  . Levaquin [Levofloxacin In D5w]     Nausea and diarrhea    VITALS:  Blood pressure 121/68, pulse 92, temperature 98 F (36.7 C), temperature source Oral, resp. rate 20, height 6\' 2"  (1.88 m), weight 117.527 kg (259 lb 1.6 oz), SpO2 98 %.  PHYSICAL EXAMINATION:   Physical Exam  GENERAL:  74 y.o.-year-old patient lying in the bed with no acute  distress. Ple EYES: Pupils equal, round, reactive to light and accommodation. No scleral icterus. Extraocular muscles intact.  HEENT: Head atraumatic, normocephalic. Oropharynx and nasopharynx clear.  NECK:  Supple, no jugular venous distention. No thyroid enlargement, no tenderness.  LUNGS: Normal breath sounds bilaterally, no wheezing, rales, rhonchi. No use of accessory muscles of respiration.  CARDIOVASCULAR: S1, S2 normal. No murmurs, rubs, or gallops.  ABDOMEN: Soft, nontender, distended. Bowel sounds present. No organomegaly or mass.  EXTREMITIES: No cyanosis, clubbing or edema b/l.    NEUROLOGIC: Cranial nerves II through XII are intact. No focal Motor or sensory deficits b/l.   PSYCHIATRIC: The patient is alert and oriented x 3.  SKIN: No obvious rash, lesion, or ulcer.    LABORATORY PANEL:   CBC  Recent Labs Lab 04/26/15 0436 04/26/15 1825  WBC 6.1  --   HGB 7.2* 8.8*  HCT 22.3* 27.1*  PLT 110*  --    ------------------------------------------------------------------------------------------------------------------  Chemistries   Recent Labs Lab 04/22/15 0052 04/22/15 1525 04/24/15 0257  NA 138  --  138  K 4.6  --  5.0  CL 105  --  104  CO2 24  --  23  GLUCOSE 326*  --  386*  BUN 34*  --  33*  CREATININE 1.11  --  1.25*  CALCIUM 8.0*  --  8.2*  MG  --  2.1  --   AST 19  --   --   ALT 13*  --   --   ALKPHOS 79  --   --   BILITOT 0.3  --   --    ------------------------------------------------------------------------------------------------------------------  Cardiac Enzymes  Recent Labs Lab 04/22/15 0052  TROPONINI 0.03   ------------------------------------------------------------------------------------------------------------------  RADIOLOGY:  No results found.   ASSESSMENT AND PLAN:   * GI bleed. 1 unit of blood transition today for symptomatically anemia Has had both upper and lower GI bleed with multiple endoscopies. Discussed with  GI. EGD 7/6, status post APC of angiectasia . Protonix drip is discontinued . Protonix by mouth twice a day . Serial Hb, transfuse as needed if hemoglobin is less than or equal to 7 May need repeat intervention if continued bleeding per gi   * Acute blood loss Anemia Transfused 2 units of packed red blood cells, will repeat one unit of blood transfusion today Repeat Hb after transfusion.  * Uncontrolled diabetes mellitus On lantus and titrate as needed, Will start lower dose. SSI  * Coronary artery disease: Stable. Status post CABG  * Peripheral artery disease: Status post aortofemoral stent placement  * COPD with chronic resp failure/reporting blackish phlegm 2/2 atypical PNA/bronchitis High-resolution CT chest is not impressive Pulmonology consult , patient sees [facility] as an outpatient On PRN Nebs and inhalers. Continue O2. Po levoflox, po and steroids  * Generalized weaknes PT consult for deconditioning   All the records are reviewed and case discussed with Care Management/Social Workerr. Management plans discussed with the patient, family and they are in agreement.  CODE STATUS: FULL CODE  DVT Prophylaxis: SCDs  TOTAL TIME TAKING CARE OF THIS PATIENT: 35 minutes.    Nicholes Mango M.D on 04/26/2015 at 10:48 PM  Between 7am to 6pm - Pager - [phone]  After 6pm go to [email or web address] - password [email or web address] [facility]  Office  [phone]  CC: Primary care physician; Dustin Flock, MD

## 2015-04-27 LAB — GLUCOSE, CAPILLARY
Glucose-Capillary: 171 mg/dL — ABNORMAL HIGH (ref 65–99)
Glucose-Capillary: 262 mg/dL — ABNORMAL HIGH (ref 65–99)

## 2015-04-27 LAB — BASIC METABOLIC PANEL
Anion gap: 7 (ref 5–15)
BUN: 32 mg/dL — AB (ref 6–20)
CALCIUM: 8.2 mg/dL — AB (ref 8.9–10.3)
CHLORIDE: 99 mmol/L — AB (ref 101–111)
CO2: 29 mmol/L (ref 22–32)
CREATININE: 1.24 mg/dL (ref 0.61–1.24)
GFR, EST NON AFRICAN AMERICAN: 56 mL/min — AB (ref >60)
Glucose, Bld: 336 mg/dL — ABNORMAL HIGH (ref 65–99)
Potassium: 4.3 mmol/L (ref 3.5–5.1)
Sodium: 135 mmol/L (ref 135–145)

## 2015-04-27 LAB — CBC
HEMATOCRIT: 25.2 % — AB (ref 40.0–52.0)
HEMOGLOBIN: 8 g/dL — AB (ref 13.0–18.0)
MCH: 27.6 pg (ref 26.0–34.0)
MCHC: 31.7 g/dL — AB (ref 32.0–36.0)
MCV: 87.2 fL (ref 80.0–100.0)
PLATELETS: 113 10*3/uL — AB (ref 150–440)
RBC: 2.89 MIL/uL — ABNORMAL LOW (ref 4.40–5.90)
RDW: 16 % — AB (ref 11.5–14.5)
WBC: 7.1 10*3/uL (ref 3.8–10.6)

## 2015-04-27 MED ORDER — LEVOFLOXACIN 500 MG PO TABS: 500.0000 mg | ORAL_TABLET | ORAL | Status: DC

## 2015-04-27 MED ORDER — DOCUSATE SODIUM 100 MG PO CAPS: 100.0000 mg | ORAL_CAPSULE | Freq: Two times a day (BID) | ORAL | Status: DC

## 2015-04-27 MED ORDER — ACETAMINOPHEN 325 MG PO TABS: 650.0000 mg | ORAL_TABLET | Freq: Four times a day (QID) | ORAL | Status: DC | PRN

## 2015-04-27 MED ORDER — INSULIN GLARGINE 100 UNIT/ML ~~LOC~~ SOLN: 17.0000 [IU] | Freq: Every day | SUBCUTANEOUS | Status: DC

## 2015-04-27 MED ORDER — INSULIN ASPART 100 UNIT/ML ~~LOC~~ SOLN: 10.0000 [IU] | Freq: Three times a day (TID) | SUBCUTANEOUS | Status: DC

## 2015-04-27 NOTE — Progress Notes (Signed)
ANTIBIOTIC CONSULT NOTE - Follow Up  Pharmacy Consult for Levofloxacin Indication: pneumonia  Allergies  Allergen Reactions  . Penicillins Anaphylaxis and Hives  . Demerol [Meperidine] Other (See Comments)    hallucinations  . Dilaudid [Hydromorphone Hcl] Other (See Comments)    hallucinations  . Levaquin [Levofloxacin In D5w]     Nausea and diarrhea    Patient Measurements: Height: 6\' 2"  (188 cm) Weight: 260 lb 3.2 oz (118.026 kg) IBW/kg (Calculated) : 82.2  Labs:  Recent Labs  04/25/15 0316 04/26/15 0436 04/26/15 1825 04/27/15 0046  WBC 10.8* 6.1  --  7.1  HGB 7.1* 7.2* 8.8* 8.0*  PLT 119* 110*  --  113*  CREATININE  --   --   --  1.24   Estimated Creatinine Clearance: 72.4 mL/min (by C-G formula based on Cr of 1.24).   Microbiology: No results found for this or any previous visit (from the past 720 hour(s)).  Medical History: Past Medical History  Diagnosis Date  . Allergic rhinitis, cause unspecified   . Other and unspecified hyperlipidemia   . 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 Unspecified essential hypertension   . 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 Severe bullous disease of LUL. Prolonged intubation after surgeries due to COPD  . 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 Anemia     Recurrent microcytic, presumably GI   . Complication of anesthesia     trouble getting off ventilator  . Memory loss   . Diabetes   . CHF (congestive heart failure)   . CAD (coronary artery disease)   . GI bleed requiring more than 4 units of blood in 24 hours, ICU, or surgery     Hx bleeding gastric polyps, cecal & sigmoid AVMS s/p APC 03/30/14  . AVM (arteriovenous malformation) of colon with hemorrhage   . Multiple gastric polyps   . Diverticulosis     Medications:  Scheduled:  . albuterol   2.5 mg Nebulization Q4H  . atorvastatin  40 mg Oral QHS  . budesonide (PULMICORT) nebulizer solution  0.5 mg Nebulization BID  . budesonide-formoterol  2 puff Inhalation BID  . citalopram  40 mg Oral Daily  . divalproex  500 mg Oral QHS  . docusate sodium  100 mg Oral BID  . folic acid  2 mg Oral q morning - 10a  . insulin aspart  0-15 Units Subcutaneous TID AC & HS  . insulin aspart  5 Units Subcutaneous TID WC  . insulin glargine  17 Units Subcutaneous QHS  . levofloxacin  500 mg Oral Q24H  . lisinopril  5 mg Oral Daily  . metoprolol tartrate  25 mg Oral BID  . pantoprazole  40 mg Oral BID  . predniSONE  20 mg Oral Q breakfast  . sodium chloride  3 mL Intravenous Q12H  . sucralfate  1 g Oral TID WC & HS  . tiotropium  18 mcg Inhalation Daily  . traZODone  50 mg Oral TID  . Vitamin D (Ergocalciferol)  50,000 Units Oral Q7 days   Assessment: Patient being treated with Levofloxacin for CAP, day 5 of treatment.   Plan:  Will continue Levaquin 500 mg po daily.     Rexene Edison, PharmD Clinical Pharmacist   04/27/2015,2:09 PM

## 2015-04-27 NOTE — Progress Notes (Signed)
Initial Nutrition Assessment  INTERVENTION:  Meals and Snacks: Cater to patient preferences    NUTRITION DIAGNOSIS:  No nutrition diagnosis at this time  GOAL:  Patient will meet greater than or equal to 90% of their needs  MONITOR:   (Energy Intake, Anthropometrics, Glucose Profile, Anemia Profile)  REASON FOR ASSESSMENT:   (RD Screen, Length of Stay)    ASSESSMENT:  Pt admitted with GI bleed; per MD note ongoing melena once a day. Pt s/p multiple transfusions. Pt had just worked with PT on visit. PMHx:  Past Medical History  Diagnosis Date  . Allergic rhinitis, cause unspecified   . Other and unspecified hyperlipidemia   . 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 Unspecified essential hypertension   . 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 Severe bullous disease of LUL. Prolonged intubation after surgeries due to COPD  . 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 Anemia     Recurrent microcytic, presumably GI   . Complication of anesthesia     trouble getting off ventilator  . Memory loss   . Diabetes   . CHF (congestive heart failure)   . CAD (coronary artery disease)   . GI bleed requiring more than 4 units of blood in 24 hours, ICU, or surgery     Hx bleeding gastric polyps, cecal & sigmoid AVMS s/p APC 03/30/14  . AVM (arteriovenous malformation) of colon with hemorrhage   . Multiple gastric polyps   . Diverticulosis     Diet Order:  DIET SOFT/CARB MODIFIED Room service appropriate?: Yes; Fluid consistency:: Thin  Current Nutrition: Pt ate 100% of lunch tray and was eating a second ice cream on visit. Recorded po intake 90-100% of meals.  Food/Nutrition-Related History: Per pt, appetite has increased secondary to medication.    Medications: vitamin D, carafate, protonix, lantus, novolog,  folic acid  Electrolyte/Renal Profile and Glucose Profile:   Recent Labs Lab 04/22/15 0052 04/22/15 1525 04/24/15 0257 04/27/15 0046  NA 138  --  138 135  K 4.6  --  5.0 4.3  CL 105  --  104 99*  CO2 24  --  23 29  BUN 34*  --  33* 32*  CREATININE 1.11  --  1.25* 1.24  CALCIUM 8.0*  --  8.2* 8.2*  MG  --  2.1  --   --   GLUCOSE 326*  --  386* 336*   Protein Profile:  Recent Labs Lab 04/22/15 0052  ALBUMIN 2.8*    Gastrointestinal Profile: Last BM: 7/11   Weight Change: Since last admission pt with weight gain. Per [facility] encounters, relatively stable weight. Anthropometrics:   Height:  Ht Readings from Last 1 Encounters:  04/22/15 6\' 2"  (1.88 m)    Weight:  Wt Readings from Last 1 Encounters:  04/27/15 260 lb 3.2 oz (118.026 kg)    Wt Readings from Last 10 Encounters:  04/27/15 260 lb 3.2 oz (118.026 kg)  04/17/15 258 lb (117.028 kg)  04/10/15 258 lb 12.8 oz (117.391 kg)  03/23/15 262 lb (118.842 kg)  03/09/15 262 lb (118.842 kg)  02/13/15 258 lb (117.028 kg)  12/29/14 253 lb (114.76 kg)  12/12/14 256 lb (116.121 kg)  11/17/14 256 lb (116.121 kg)  09/03/14 258 lb (117.028 kg)    BMI:  Body mass index  is 33.39 kg/(m^2).   EDUCATION NEEDS:  No education needs identified at this time   [place], [place], LDN Pager [phone]

## 2015-04-27 NOTE — Discharge Summary (Signed)
[facility] [place] NAME: [name]    MR#:  [id number]  DATE OF BIRTH:  01/08/1941  DATE OF ADMISSION:  04/22/2015 ADMITTING PHYSICIAN: Harrie Foreman, MD  DATE OF DISCHARGE: 04/27/2015 PRIMARY CARE PHYSICIAN:    ADMISSION DIAGNOSIS:  sob melena/recurrent GI bleed  DISCHARGE DIAGNOSIS:  Active Problems:   GI bleed secondary to AV malformation   Acute blood loss anemia   Guaiac positive stools   Gastric AVM   AVM (arteriovenous malformation) of duodenum, acquired with hemorrhage   SECONDARY DIAGNOSIS:   Past Medical History  Diagnosis Date  . Allergic rhinitis, cause unspecified   . Other and unspecified hyperlipidemia   . 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 Unspecified essential hypertension   . 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 Severe bullous disease of LUL. Prolonged intubation after surgeries due to COPD  . 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 Anemia     Recurrent microcytic, presumably GI   . Complication of anesthesia     trouble getting off ventilator  . Memory loss   . Diabetes   . CHF (congestive heart failure)   . CAD (coronary artery disease)   . GI bleed requiring more than 4 units of blood in 24 hours, ICU, or surgery     Hx bleeding gastric polyps, cecal & sigmoid AVMS s/p APC 03/30/14  . AVM (arteriovenous malformation) of colon with hemorrhage   . Multiple gastric polyps   . Diverticulosis     HOSPITAL COURSE:   * GI bleed.  blood transition  Given for symptomatically anemia Has had both upper and lower GI bleed with multiple endoscopies. Discussed with GI. EGD 7/6, status post APC of angiectasia . Protonix drip is discontinued . Protonix by mouth twice a day . Serial Hb, transfused as needed if hemoglobin  is less than or equal to 7 Outpatient follow-up with GI in 2 weeks is recommended   * Acute blood loss Anemia Transfused packed red blood cells Continue iron supplements with laxatives  * Uncontrolled diabetes mellitus On lantus and titrated to 17 units subcutaneous once daily SSI  * Coronary artery disease: Stable. Status post CABG  * Peripheral artery disease: Status post aortofemoral stent placement  * COPD with chronic resp failure/reporting blackish phlegm 2/2 atypical PNA/bronchitis High-resolution CT chest is not impressive Pulmonology consult , patient sees [facility] as an outpatient On PRN Nebs and inhalers. Continue home O2. Po levoflox, completed steroids  * Generalized weaknes PT consult for deconditioning recommended home PT   DISCHARGE CONDITIONS:   Satisfactory  CONSULTS OBTAINED:  Treatment Team:  [name], MD Nicholes Mango, MD   PROCEDURES APC  DRUG ALLERGIES:   Allergies  Allergen Reactions  . Penicillins Anaphylaxis and Hives  . Demerol [Meperidine] Other (See Comments)    hallucinations  . Dilaudid [Hydromorphone Hcl] Other (See Comments)    hallucinations  . Levaquin [Levofloxacin In D5w]     Nausea and diarrhea    DISCHARGE MEDICATIONS:   Current Discharge Medication List    START taking these medications   Details  acetaminophen (TYLENOL) 325 MG tablet Take 2 tablets (650 mg total) by mouth every 6 (six) hours as needed for mild pain (or Fever >/= 101).    docusate  sodium (COLACE) 100 MG capsule Take 1 capsule (100 mg total) by mouth 2 (two) times daily. Qty: 10 capsule, Refills: 0    levofloxacin (LEVAQUIN) 500 MG tablet Take 1 tablet (500 mg total) by mouth daily. Qty: 4 tablet, Refills: 0      CONTINUE these medications which have CHANGED   Details  !! insulin aspart (NOVOLOG) 100 UNIT/ML injection Inject 10 Units into the skin 3 (three) times daily with meals. Qty: 10 mL, Refills: 11    insulin glargine  (LANTUS) 100 UNIT/ML injection Inject 0.17 mLs (17 Units total) into the skin daily. Qty: 10 mL, Refills: 11     !! - Potential duplicate medications found. Please discuss with provider.    CONTINUE these medications which have NOT CHANGED   Details  albuterol (PROVENTIL HFA;VENTOLIN HFA) 108 (90 BASE) MCG/ACT inhaler Inhale 2 puffs into the lungs every 6 (six) hours as needed for wheezing.    ALPRAZolam (XANAX) 0.5 MG tablet Take 1 tablet (0.5 mg total) by mouth 3 (three) times daily as needed for anxiety (for shortness of breath). Qty: 20 tablet, Refills: 2    atorvastatin (LIPITOR) 40 MG tablet Take 1 tablet (40 mg total) by mouth at bedtime. <please make appointment for refills> Qty: 90 tablet, Refills: 0    bisacodyl (DULCOLAX) 5 MG EC tablet Take 1 tablet (5 mg total) by mouth daily as needed for moderate constipation. Qty: 30 tablet, Refills: 0    budesonide-formoterol (SYMBICORT) 160-4.5 MCG/ACT inhaler Inhale 2 puffs into the lungs 2 (two) times daily. Qty: 1 Inhaler, Refills: 11    Calcium Carbonate Antacid (ROLAIDS EXTRA STRENGTH PO) Take 2 tablets by mouth daily as needed (indigestion).    citalopram (CELEXA) 40 MG tablet TAKE 1 TABLET (40 MG TOTAL) BY MOUTH DAILY. Qty: 30 tablet, Refills: 5    divalproex (DEPAKOTE) 500 MG DR tablet Take 1 tablet (500 mg total) by mouth at bedtime. Qty: 1 tablet, Refills: 0    folic acid (FOLVITE) 1 MG tablet Take 2 tablets (2 mg total) by mouth every morning. Need appointment before anymore refills Qty: 60 tablet, Refills: 0    !! insulin aspart (NOVOLOG) 100 UNIT/ML injection Inject 0-15 Units into the skin 4 (four) times daily -  before meals and at bedtime. Qty: 10 mL, Refills: 11    iron polysaccharides (NIFEREX) 150 MG capsule Take 150 mg by mouth 2 (two) times daily.    lisinopril (PRINIVIL,ZESTRIL) 5 MG tablet Take 5 mg by mouth daily.     metFORMIN (GLUCOPHAGE) 500 MG tablet Take 1 tablet (500 mg total) by mouth  daily. Qty: 30 tablet, Refills: 1    metoprolol tartrate (LOPRESSOR) 25 MG tablet Take 1 tablet (25 mg total) by mouth 2 (two) times daily. Need appointment before anymore refills Qty: 60 tablet, Refills: 0    pantoprazole (PROTONIX) 40 MG tablet Take 1 tablet (40 mg total) by mouth 2 (two) times daily. Need appointment before anymore refills Qty: 60 tablet, Refills: 0    sucralfate (CARAFATE) 1 G tablet Take 1 tablet (1 g total) by mouth 4 (four) times daily -  with meals and at bedtime. Qty: 90 tablet, Refills: 0    tiotropium (SPIRIVA) 18 MCG inhalation capsule Place 1 capsule (18 mcg total) into inhaler and inhale daily. Qty: 30 capsule, Refills: 3    traZODone (DESYREL) 50 MG tablet Take 1 tablet (50 mg total) by mouth 3 (three) times daily. Qty: 90 tablet, Refills: 0    Vitamin  D, Ergocalciferol, (DRISDOL) 50000 UNITS CAPS capsule TAKE 1 CAPSULE (50,000 UNITS TOTAL) BY MOUTH ONCE A WEEK.  'OV NEEDED" Qty: 12 capsule, Refills: 0    zolpidem (AMBIEN) 5 MG tablet TAKE 1 TABLET BY MOUTH AT BEDTIME AS NEEDED FOR SLEEP Qty: 30 tablet, Refills: 5     !! - Potential duplicate medications found. Please discuss with provider.       DISCHARGE INSTRUCTIONS:   Follow-up with primary care physician in a week Home health with home PT Follow-up with gastroenterology in 2 weeks or sooner as needed Follow-up with cardiology as recommended Continue iron supplements  DIET:  Low fat, Low cholesterol diet, diabetic diet  DISCHARGE CONDITION:  Fair  ACTIVITY:  Activity as tolerated  OXYGEN:  Home Oxygen: Yes.     Oxygen Delivery: 3 L % O2 via Patient connected to nasal cannula oxygen  DISCHARGE LOCATION:  Home with home health  If you experience worsening of your admission symptoms, develop shortness of breath, life threatening emergency, suicidal or homicidal thoughts you must seek medical attention immediately by calling 911 or calling your MD immediately  if symptoms less  severe.  You Must read complete instructions/literature along with all the possible adverse reactions/side effects for all the Medicines you take and that have been prescribed to you. Take any new Medicines after you have completely understood and accpet all the possible adverse reactions/side effects.   Please note  You were cared for by a hospitalist during your hospital stay. If you have any questions about your discharge medications or the care you received while you were in the hospital after you are discharged, you can call the unit and asked to speak with the hospitalist on call if the hospitalist that took care of you is not available. Once you are discharged, your primary care physician will handle any further medical issues. Please note that NO REFILLS for any discharge medications will be authorized once you are discharged, as it is imperative that you return to your primary care physician (or establish a relationship with a primary care physician if you do not have one) for your aftercare needs so that they can reassess your need for medications and monitor your lab values.     Today  Chief Complaint  Patient presents with  . Respiratory Distress  . Melena   Still noticing black stool but very small amount. Discussed with gastroenterology, patient was told that he might notice small amount of black tarry stool for the next 1 week or so  ROS: Chronic weakness and fatigue CONSTITUTIONAL: Denies fevers, chills. Denies any fatigue, weakness.  EYES: Denies blurry vision, double vision, eye pain. EARS, NOSE, THROAT: Denies tinnitus, ear pain, hearing loss. RESPIRATORY: Denies cough, wheeze, shortness of breath.  CARDIOVASCULAR: Denies chest pain, palpitations, edema.  GASTROINTESTINAL: Denies nausea, vomiting, diarrhea, abdominal pain. Denies bright red blood per rectum. GENITOURINARY: Denies dysuria, hematuria. ENDOCRINE: Denies nocturia or thyroid problems. HEMATOLOGIC AND  LYMPHATIC: Denies easy bruising or bleeding. SKIN: Denies rash or lesion. MUSCULOSKELETAL: Denies pain in neck, back, shoulder, knees, hips or arthritic symptoms.  NEUROLOGIC: Denies paralysis, paresthesias.  PSYCHIATRIC: Denies anxiety or depressive symptoms.   VITAL SIGNS:  Blood pressure 109/55, pulse 66, temperature 98 F (36.7 C), temperature source Oral, resp. rate 20, height 6\' 2"  (1.88 m), weight 118.026 kg (260 lb 3.2 oz), SpO2 97 %.  I/O:   Intake/Output Summary (Last 24 hours) at 04/27/15 1411 Last data filed at 04/27/15 1300  Gross per 24 hour  Intake 1045.67 ml  Output   3425 ml  Net -2379.33 ml    PHYSICAL EXAMINATION:  GENERAL:  74 y.o.-year-old patient lying in the bed with no acute distress.  EYES: Pupils equal, round, reactive to light and accommodation. No scleral icterus. Extraocular muscles intact.  HEENT: Head atraumatic, normocephalic. Oropharynx and nasopharynx clear.  NECK:  Supple, no jugular venous distention. No thyroid enlargement, no tenderness.  LUNGS: Normal breath sounds bilaterally, no wheezing, rales,rhonchi or crepitation. No use of accessory muscles of respiration.  CARDIOVASCULAR: S1, S2 normal. No murmurs, rubs, or gallops.  ABDOMEN: Soft, non-tender, non-distended. Bowel sounds present. No organomegaly or mass.  EXTREMITIES: No pedal edema, cyanosis, or clubbing.  NEUROLOGIC: Cranial nerves II through XII are intact. Muscle strength 5/5 in all extremities. Sensation intact. Gait not checked.  PSYCHIATRIC: The patient is alert and oriented x 3.  SKIN: No obvious rash, lesion, or ulcer.   DATA REVIEW:   CBC  Recent Labs Lab 04/27/15 0046  WBC 7.1  HGB 8.0*  HCT 25.2*  PLT 113*    Chemistries   Recent Labs Lab 04/22/15 0052 04/22/15 1525  04/27/15 0046  NA 138  --   < > 135  K 4.6  --   < > 4.3  CL 105  --   < > 99*  CO2 24  --   < > 29  GLUCOSE 326*  --   < > 336*  BUN 34*  --   < > 32*  CREATININE 1.11  --   < > 1.24   CALCIUM 8.0*  --   < > 8.2*  MG  --  2.1  --   --   AST 19  --   --   --   ALT 13*  --   --   --   ALKPHOS 79  --   --   --   BILITOT 0.3  --   --   --   < > = values in this interval not displayed.  Cardiac Enzymes  Recent Labs Lab 04/22/15 0052  TROPONINI 0.03    Microbiology Results  Results for orders placed or performed during the hospital encounter of 03/09/15  C difficile quick scan w PCR reflex [facility])     Status: None   Collection Time: 03/10/15 12:27 PM  Result Value Ref Range Status   C Diff antigen NEGATIVE  Final   C Diff toxin NEGATIVE  Final   C Diff interpretation Negative for C. difficile  Final    RADIOLOGY:  No results found.  EKG:   Orders placed or performed during the hospital encounter of 04/22/15  . EKG 12-Lead  . EKG 12-Lead      Management plans discussed with the patient, family and they are in agreement.  CODE STATUS:     Code Status Orders        Start     Ordered   04/22/15 0450  Full code   Continuous     04/22/15 0450      TOTAL TIME TAKING CARE OF THIS PATIENT: Not feeling he is 45 minutes.    @[facility] @  on 04/27/2015 at 2:11 PM  Between 7am to 6pm - Pager - [phone]  After 6pm go to [email or web address] - password [email or web address] [place] [facility]  Office  [phone]  CC: Primary care physician; Dustin Flock, MD

## 2015-04-27 NOTE — Progress Notes (Signed)
Discharge instructions given to patient and wife. Prescription for levaquin given. MD ordered for patient to "resume" home insulin just change dose, patient and wife both report he has never taken insulin at home. Gave the patient the option for me to call primary MD to clarify order or to let the patient follow up with with PCP outpatient and to take metformin at home until appointment. Patient stated he was ready to go now and wanted to follow up with Dr. Ouida Sills for insulin at home. Education given on AV malformation and GI bleed. Wife brought portable oxygen to transport home. IV and tele discontinued.

## 2015-04-27 NOTE — Care Management (Signed)
It is reported that patient requested physical therapy to see patient prior to discharge.  Therapist verbally reported that recommendation is for skilled nursing.  Patient adamantly refuses skilled nursing and he adamantly refuses home health nursing and physical therapy follow up.   Asked patient how does he get around at home and he says "by walking -  I do not need any help."  Raises his voice during this discussion.  Verbalizes understanding to have his wife bring his portable 02 tank for transport home.  Denies need for any service multiple times.

## 2015-04-27 NOTE — Progress Notes (Signed)
Inpatient Diabetes Program Recommendations  AACE/ADA: New Consensus Statement on Inpatient Glycemic Control (2013)  Target Ranges:  Prepandial:   less than 140 mg/dL      Peak postprandial:   less than 180 mg/dL (1-2 hours)      Critically ill patients:  140 - 180 mg/dL   Results for CHISUM, HABENICHT (MRN [id number]) as of 04/27/2015 08:46  Ref. Range 04/26/2015 07:33 04/26/2015 11:50 04/26/2015 16:23 04/26/2015 20:27 04/27/2015 07:36  Glucose-Capillary Latest Ref Range: 65-99 mg/dL 230 (H) 316 (H) 308 (H) 366 (H) 171 (H)   Diabetes history: Type 2 Outpatient Diabetes medications: Lantus 12 units daily, Novolog 0-15 units ACHS, Novolog 5 units TID with meals, Metformin 500 mg daily Current orders for Inpatient glycemic control: Lantus 17 units QHS, Novolog 0-15 units ACHS, Novolog 5 units TID with meals for meal coverage  Inpatient Diabetes Program Recommendations Correction (SSI): Please increase Novolog correction scale to Resistant scale. Insulin - Meal Coverage: Post prandial glucose continues to be over 308 mg/dl with Novolog 5 units TID meal coverage. Please increase meal coverage to Novolog 12 units TID with meals.   Thanks, Barnie Alderman, RN, MSN, CCRN, CDE Diabetes Coordinator Inpatient Diabetes Program [phone] (Team Pager from [place] to [place]) [phone] ([facility] office) [phone] [facility] office) [phone] [facility] office)

## 2015-04-27 NOTE — Progress Notes (Signed)
Physical Therapy Treatment Patient Details Name: [name] MRN: [id number] DOB: Aug 17, 1941 Today's Date: 04/27/2015    History of Present Illness 74 yo male with GI bleed s/p APC of angiectasia.    PT Comments    Pt making progress towards goals. Pt demonstrates decreased activity tolerance with ambulation and stair training. Pt does not wish to go to any form of rehab, but his deficits indicate that in order to return home safely it would benefit him to receive skilled PT in the rehabilitation setting. The contributing deficits include global deconditioning, unsteadiness with gait, cardiopulmonary restrictions, decreased knowledge of safe use of DME and precautions. It was discussed with the pt what the recommendation was and he did not agree with PT. Pt was educated that if he chooses to go home, HHPT will be recommended. Pt stated no further DME needs in the event of his return home.   Follow Up Recommendations  SNF     Equipment Recommendations  None recommended by PT    Recommendations for Other Services       Precautions / Restrictions Precautions Precautions: None Restrictions Weight Bearing Restrictions: No    Mobility  Bed Mobility Overal bed mobility: Modified Independent             General bed mobility comments: Supine to sit swinging legs off bed with HOB elevated and using bed rails.  Transfers Overall transfer level: Modified independent Equipment used: Rolling walker (2 wheeled) Transfers: Sit to/from Stand Sit to Stand: Modified independent (Device/Increase time)         General transfer comment: pushing up from bed   Ambulation/Gait Ambulation/Gait assistance: Supervision;Min guard Ambulation Distance (Feet): 100 Feet Assistive device: Rolling walker (2 wheeled) Gait Pattern/deviations: Step-through pattern;Trunk flexed Gait velocity: decreased   General Gait Details: Pt ambulates with RW and supervision/CGA for safety. Pt tends to  allow the walker to extend too far beyond the BOS, possibly leading to a future fall. Pt expressed fatigue after 100 ft ambulation; oxygen sat never fell below 90%. Pt also ambulated 4 feet without assistive device in order to reach hand rails on stairs. This ambulation was stagger-stepped and unsteady.     Stairs Stairs: Yes Stairs assistance: Min guard Stair Management: One rail Right Number of Stairs: 4 General stair comments: Pt ambulates up stairs with CGA and R handrail. It is slow and labored and at the end of the 4 steps the pt needs to rest. Oxygen sat post-stair navigation was 94% on 2 L/min via nasal cannula.  Wheelchair Mobility    Modified Rankin (Stroke Patients Only)       Balance Overall balance assessment: Modified Independent Sitting-balance support: Feet supported Sitting balance-Leahy Scale: Good                              Cognition Arousal/Alertness: Awake/alert Behavior During Therapy: Impulsive;Agitated Overall Cognitive Status: Difficult to assess                      Exercises      General Comments  Nursing called PT to see pt d/t pt requesting to see PT prior to discharge.      Pertinent Vitals/Pain  No c/o pain during session.    Home Living                      Prior Function  PT Goals (current goals can now be found in the care plan section) Acute Rehab PT Goals Patient Stated Goal: I need to walk 60 ft  PT Goal Formulation: With patient Time For Goal Achievement: 05/09/15 Potential to Achieve Goals: Fair Progress towards PT goals: Progressing toward goals    Frequency  Min 2X/week    PT Plan Discharge plan needs to be updated    Co-evaluation             End of Session Equipment Utilized During Treatment: Gait belt Activity Tolerance: Patient limited by fatigue Patient left: in chair;with chair alarm set;with call bell/phone within reach     Time: 9826-4158 PT Time  Calculation (min) (ACUTE ONLY): 40 min  Charges:                       G CodesJanyth Contes 2015-05-08, 2:34 PM Janyth Contes, SPT. [phone]

## 2015-04-27 NOTE — Discharge Instructions (Signed)
Home with home PT  follow-up with primary care physician in a week  Follow-up with GI in 2 weeks Diet healthy heart diabetic Activity as recommended by physical therapy

## 2015-04-27 NOTE — Care Management (Signed)
Important Message  Patient Details  Name: [name] MRN: [id number] Date of Birth: 1941-06-28   Medicare Important Message Given:  Yes-third notification given    Juliann Pulse A Allmond 04/27/2015, 12:01 PM

## 2015-04-28 LAB — TYPE AND SCREEN
ABO/RH(D): B NEG
Antibody Screen: NEGATIVE
UNIT DIVISION: 0
Unit division: 0
Unit division: 0

## 2015-04-30 NOTE — Care Management (Signed)
Patient called unit this morning asking when the nurse and physical therapist would be seeing him.  Informed patient no services were set up because patient himself refused to allow care manager to arrange the services.  Explained to patient that CM would now have to contact Dr Frazier Richards to inquire if he will sign the order for home health.  Patient  does not have a pcp and patient has an initial appointment with Dr Ouida Sills on 7/19 to get established.  Left message of inquiry at [facility].

## 2015-05-03 ENCOUNTER — Encounter: Payer: Self-pay | Admitting: Emergency Medicine

## 2015-05-03 ENCOUNTER — Emergency Department: Payer: Commercial Managed Care - HMO

## 2015-05-03 ENCOUNTER — Observation Stay (HOSPITAL_BASED_OUTPATIENT_CLINIC_OR_DEPARTMENT_OTHER)
Admit: 2015-05-03 | Discharge: 2015-05-03 | Disposition: A | Discharge status: Home / Self Care | Payer: Commercial Managed Care - HMO | Attending: Internal Medicine | Admitting: Internal Medicine

## 2015-05-03 ENCOUNTER — Observation Stay (HOSPITAL_BASED_OUTPATIENT_CLINIC_OR_DEPARTMENT_OTHER)
Admission: EM | Admit: 2015-05-03 | Discharge: 2015-05-04 | Disposition: A | Discharge status: Home / Self Care | Payer: Commercial Managed Care - HMO | Source: Home / Self Care | Attending: Emergency Medicine | Admitting: Emergency Medicine

## 2015-05-03 DIAGNOSIS — Z9981 Dependence on supplemental oxygen: Secondary | ICD-10-CM

## 2015-05-03 DIAGNOSIS — F418 Other specified anxiety disorders: Secondary | ICD-10-CM | POA: Diagnosis present

## 2015-05-03 DIAGNOSIS — Z880 Allergy status to penicillin: Secondary | ICD-10-CM | POA: Insufficient documentation

## 2015-05-03 DIAGNOSIS — E1165 Type 2 diabetes mellitus with hyperglycemia: Secondary | ICD-10-CM | POA: Diagnosis present

## 2015-05-03 DIAGNOSIS — D500 Iron deficiency anemia secondary to blood loss (chronic): Secondary | ICD-10-CM

## 2015-05-03 DIAGNOSIS — R079 Chest pain, unspecified: Secondary | ICD-10-CM

## 2015-05-03 DIAGNOSIS — R0600 Dyspnea, unspecified: Secondary | ICD-10-CM | POA: Insufficient documentation

## 2015-05-03 DIAGNOSIS — R0789 Other chest pain: Secondary | ICD-10-CM | POA: Insufficient documentation

## 2015-05-03 DIAGNOSIS — Z9889 Other specified postprocedural states: Secondary | ICD-10-CM | POA: Insufficient documentation

## 2015-05-03 DIAGNOSIS — Z87891 Personal history of nicotine dependence: Secondary | ICD-10-CM

## 2015-05-03 DIAGNOSIS — K921 Melena: Secondary | ICD-10-CM | POA: Insufficient documentation

## 2015-05-03 DIAGNOSIS — Z9049 Acquired absence of other specified parts of digestive tract: Secondary | ICD-10-CM | POA: Insufficient documentation

## 2015-05-03 DIAGNOSIS — Z951 Presence of aortocoronary bypass graft: Secondary | ICD-10-CM

## 2015-05-03 DIAGNOSIS — R918 Other nonspecific abnormal finding of lung field: Secondary | ICD-10-CM | POA: Insufficient documentation

## 2015-05-03 DIAGNOSIS — I2510 Atherosclerotic heart disease of native coronary artery without angina pectoris: Secondary | ICD-10-CM | POA: Diagnosis present

## 2015-05-03 DIAGNOSIS — E119 Type 2 diabetes mellitus without complications: Secondary | ICD-10-CM | POA: Insufficient documentation

## 2015-05-03 DIAGNOSIS — J9610 Chronic respiratory failure, unspecified whether with hypoxia or hypercapnia: Secondary | ICD-10-CM | POA: Diagnosis present

## 2015-05-03 DIAGNOSIS — K449 Diaphragmatic hernia without obstruction or gangrene: Secondary | ICD-10-CM | POA: Insufficient documentation

## 2015-05-03 DIAGNOSIS — Z794 Long term (current) use of insulin: Secondary | ICD-10-CM | POA: Insufficient documentation

## 2015-05-03 DIAGNOSIS — Z885 Allergy status to narcotic agent status: Secondary | ICD-10-CM | POA: Insufficient documentation

## 2015-05-03 DIAGNOSIS — J479 Bronchiectasis, uncomplicated: Secondary | ICD-10-CM

## 2015-05-03 DIAGNOSIS — K2970 Gastritis, unspecified, without bleeding: Secondary | ICD-10-CM | POA: Diagnosis present

## 2015-05-03 DIAGNOSIS — E785 Hyperlipidemia, unspecified: Secondary | ICD-10-CM | POA: Diagnosis present

## 2015-05-03 DIAGNOSIS — R571 Hypovolemic shock: Secondary | ICD-10-CM | POA: Diagnosis present

## 2015-05-03 DIAGNOSIS — I509 Heart failure, unspecified: Secondary | ICD-10-CM | POA: Insufficient documentation

## 2015-05-03 DIAGNOSIS — G4700 Insomnia, unspecified: Secondary | ICD-10-CM | POA: Diagnosis present

## 2015-05-03 DIAGNOSIS — K4020 Bilateral inguinal hernia, without obstruction or gangrene, not specified as recurrent: Secondary | ICD-10-CM

## 2015-05-03 DIAGNOSIS — I517 Cardiomegaly: Secondary | ICD-10-CM

## 2015-05-03 DIAGNOSIS — J439 Emphysema, unspecified: Secondary | ICD-10-CM | POA: Insufficient documentation

## 2015-05-03 DIAGNOSIS — Z825 Family history of asthma and other chronic lower respiratory diseases: Secondary | ICD-10-CM

## 2015-05-03 DIAGNOSIS — R42 Dizziness and giddiness: Secondary | ICD-10-CM

## 2015-05-03 DIAGNOSIS — R05 Cough: Secondary | ICD-10-CM

## 2015-05-03 DIAGNOSIS — Z79899 Other long term (current) drug therapy: Secondary | ICD-10-CM

## 2015-05-03 DIAGNOSIS — J9811 Atelectasis: Secondary | ICD-10-CM | POA: Insufficient documentation

## 2015-05-03 DIAGNOSIS — Z881 Allergy status to other antibiotic agents status: Secondary | ICD-10-CM

## 2015-05-03 DIAGNOSIS — K5521 Angiodysplasia of colon with hemorrhage: Principal | ICD-10-CM | POA: Diagnosis present

## 2015-05-03 DIAGNOSIS — D120 Benign neoplasm of cecum: Secondary | ICD-10-CM | POA: Diagnosis present

## 2015-05-03 DIAGNOSIS — Z8249 Family history of ischemic heart disease and other diseases of the circulatory system: Secondary | ICD-10-CM

## 2015-05-03 DIAGNOSIS — K5730 Diverticulosis of large intestine without perforation or abscess without bleeding: Secondary | ICD-10-CM

## 2015-05-03 DIAGNOSIS — F410 Panic disorder [episodic paroxysmal anxiety] without agoraphobia: Secondary | ICD-10-CM | POA: Diagnosis present

## 2015-05-03 DIAGNOSIS — Q2733 Arteriovenous malformation of digestive system vessel: Secondary | ICD-10-CM

## 2015-05-03 DIAGNOSIS — D124 Benign neoplasm of descending colon: Secondary | ICD-10-CM | POA: Diagnosis present

## 2015-05-03 DIAGNOSIS — K922 Gastrointestinal hemorrhage, unspecified: Secondary | ICD-10-CM | POA: Diagnosis not present

## 2015-05-03 DIAGNOSIS — K5750 Diverticulosis of both small and large intestine without perforation or abscess without bleeding: Secondary | ICD-10-CM | POA: Diagnosis present

## 2015-05-03 DIAGNOSIS — R1084 Generalized abdominal pain: Secondary | ICD-10-CM

## 2015-05-03 DIAGNOSIS — Z7951 Long term (current) use of inhaled steroids: Secondary | ICD-10-CM

## 2015-05-03 DIAGNOSIS — Z833 Family history of diabetes mellitus: Secondary | ICD-10-CM

## 2015-05-03 DIAGNOSIS — I10 Essential (primary) hypertension: Secondary | ICD-10-CM | POA: Insufficient documentation

## 2015-05-03 DIAGNOSIS — J441 Chronic obstructive pulmonary disease with (acute) exacerbation: Secondary | ICD-10-CM | POA: Diagnosis present

## 2015-05-03 DIAGNOSIS — I252 Old myocardial infarction: Secondary | ICD-10-CM | POA: Insufficient documentation

## 2015-05-03 DIAGNOSIS — I5032 Chronic diastolic (congestive) heart failure: Secondary | ICD-10-CM | POA: Diagnosis present

## 2015-05-03 DIAGNOSIS — D175 Benign lipomatous neoplasm of intra-abdominal organs: Secondary | ICD-10-CM | POA: Diagnosis present

## 2015-05-03 DIAGNOSIS — I739 Peripheral vascular disease, unspecified: Secondary | ICD-10-CM | POA: Diagnosis present

## 2015-05-03 DIAGNOSIS — R0602 Shortness of breath: Secondary | ICD-10-CM

## 2015-05-03 DIAGNOSIS — D62 Acute posthemorrhagic anemia: Secondary | ICD-10-CM | POA: Diagnosis present

## 2015-05-03 DIAGNOSIS — Z91048 Other nonmedicinal substance allergy status: Secondary | ICD-10-CM

## 2015-05-03 DIAGNOSIS — K219 Gastro-esophageal reflux disease without esophagitis: Secondary | ICD-10-CM | POA: Diagnosis present

## 2015-05-03 LAB — COMPREHENSIVE METABOLIC PANEL
ALK PHOS: 87 U/L (ref 38–126)
ALT: 15 U/L — ABNORMAL LOW (ref 17–63)
ANION GAP: 6 (ref 5–15)
AST: 15 U/L (ref 15–41)
Albumin: 2.9 g/dL — ABNORMAL LOW (ref 3.5–5.0)
BILIRUBIN TOTAL: 0.4 mg/dL (ref 0.3–1.2)
BUN: 12 mg/dL (ref 6–20)
CO2: 33 mmol/L — ABNORMAL HIGH (ref 22–32)
CREATININE: 0.89 mg/dL (ref 0.61–1.24)
Calcium: 8.3 mg/dL — ABNORMAL LOW (ref 8.9–10.3)
Chloride: 101 mmol/L (ref 101–111)
GFR calc Af Amer: >60 mL/min (ref >60)
Glucose, Bld: 200 mg/dL — ABNORMAL HIGH (ref 65–99)
Potassium: 3.9 mmol/L (ref 3.5–5.1)
SODIUM: 140 mmol/L (ref 135–145)
Total Protein: 5.7 g/dL — ABNORMAL LOW (ref 6.5–8.1)

## 2015-05-03 LAB — CBC
HCT: 26.9 % — ABNORMAL LOW (ref 40.0–52.0)
Hemoglobin: 8.5 g/dL — ABNORMAL LOW (ref 13.0–18.0)
MCH: 27.4 pg (ref 26.0–34.0)
MCHC: 31.6 g/dL — ABNORMAL LOW (ref 32.0–36.0)
MCV: 86.8 fL (ref 80.0–100.0)
Platelets: 244 K/uL (ref 150–440)
RBC: 3.1 MIL/uL — ABNORMAL LOW (ref 4.40–5.90)
RDW: 17 % — ABNORMAL HIGH (ref 11.5–14.5)
WBC: 6.9 K/uL (ref 3.8–10.6)

## 2015-05-03 LAB — GLUCOSE, CAPILLARY
GLUCOSE-CAPILLARY: 314 mg/dL — AB (ref 65–99)
Glucose-Capillary: 212 mg/dL — ABNORMAL HIGH (ref 65–99)

## 2015-05-03 LAB — TROPONIN I
Troponin I: 0.03 ng/mL
Troponin I: 0.03 ng/mL (ref <0.031)
Troponin I: 0.03 ng/mL (ref <0.031)

## 2015-05-03 MED ORDER — INSULIN ASPART 100 UNIT/ML ~~LOC~~ SOLN
10.0000 [IU] | Freq: Three times a day (TID) | SUBCUTANEOUS | Status: DC
  Administered 2015-05-03 – 2015-05-04 (×3): 10 [IU] via SUBCUTANEOUS
  Filled 2015-05-03 (×3): qty 10

## 2015-05-03 MED ORDER — INSULIN ASPART 100 UNIT/ML ~~LOC~~ SOLN: 0.0000 [IU] | Freq: Three times a day (TID) | SUBCUTANEOUS | Status: DC

## 2015-05-03 MED ORDER — SODIUM CHLORIDE 0.9 % IJ SOLN: 3.0000 mL | Freq: Two times a day (BID) | INTRAMUSCULAR | Status: DC

## 2015-05-03 MED ORDER — BISACODYL 5 MG PO TBEC: 5.0000 mg | DELAYED_RELEASE_TABLET | Freq: Every day | ORAL | Status: DC | PRN

## 2015-05-03 MED ORDER — FOLIC ACID 1 MG PO TABS
2.0000 mg | ORAL_TABLET | Freq: Every morning | ORAL | Status: DC
  Administered 2015-05-03 – 2015-05-04 (×2): 2 mg via ORAL
  Filled 2015-05-03 (×2): qty 2

## 2015-05-03 MED ORDER — SODIUM CHLORIDE 0.9 % IJ SOLN: 3.0000 mL | INTRAMUSCULAR | Status: DC | PRN

## 2015-05-03 MED ORDER — MORPHINE SULFATE 2 MG/ML IJ SOLN: 2.0000 mg | INTRAMUSCULAR | Status: DC | PRN

## 2015-05-03 MED ORDER — IOHEXOL 350 MG/ML SOLN
100.0000 mL | Freq: Once | INTRAVENOUS | Status: AC | PRN
  Administered 2015-05-03: 86 mL via INTRAVENOUS

## 2015-05-03 MED ORDER — BUDESONIDE-FORMOTEROL FUMARATE 160-4.5 MCG/ACT IN AERO
2.0000 | INHALATION_SPRAY | Freq: Two times a day (BID) | RESPIRATORY_TRACT | Status: DC
  Administered 2015-05-03 – 2015-05-04 (×3): 2 via RESPIRATORY_TRACT
  Filled 2015-05-03: qty 6

## 2015-05-03 MED ORDER — INSULIN ASPART 100 UNIT/ML ~~LOC~~ SOLN
0.0000 [IU] | Freq: Every day | SUBCUTANEOUS | Status: DC
  Administered 2015-05-03: 4 [IU] via SUBCUTANEOUS
  Filled 2015-05-03: qty 4

## 2015-05-03 MED ORDER — SUCRALFATE 1 G PO TABS
1.0000 g | ORAL_TABLET | Freq: Three times a day (TID) | ORAL | Status: DC
  Administered 2015-05-03 – 2015-05-04 (×4): 1 g via ORAL
  Filled 2015-05-03 (×4): qty 1

## 2015-05-03 MED ORDER — PANTOPRAZOLE SODIUM 40 MG PO TBEC
40.0000 mg | DELAYED_RELEASE_TABLET | Freq: Two times a day (BID) | ORAL | Status: DC
  Administered 2015-05-03 – 2015-05-04 (×3): 40 mg via ORAL
  Filled 2015-05-03 (×3): qty 1

## 2015-05-03 MED ORDER — ZOLPIDEM TARTRATE 5 MG PO TABS
5.0000 mg | ORAL_TABLET | Freq: Every evening | ORAL | Status: DC | PRN
  Administered 2015-05-03: 5 mg via ORAL
  Filled 2015-05-03: qty 1

## 2015-05-03 MED ORDER — TRAZODONE HCL 50 MG PO TABS
50.0000 mg | ORAL_TABLET | Freq: Three times a day (TID) | ORAL | Status: DC
  Administered 2015-05-03 – 2015-05-04 (×3): 50 mg via ORAL
  Filled 2015-05-03 (×4): qty 1

## 2015-05-03 MED ORDER — DIVALPROEX SODIUM 500 MG PO DR TAB
500.0000 mg | DELAYED_RELEASE_TABLET | Freq: Every day | ORAL | Status: DC
  Administered 2015-05-03: 500 mg via ORAL
  Filled 2015-05-03: qty 1

## 2015-05-03 MED ORDER — POLYSACCHARIDE IRON COMPLEX 150 MG PO CAPS
150.0000 mg | ORAL_CAPSULE | Freq: Two times a day (BID) | ORAL | Status: DC
  Administered 2015-05-03 – 2015-05-04 (×2): 150 mg via ORAL
  Filled 2015-05-03 (×4): qty 1

## 2015-05-03 MED ORDER — LORAZEPAM 2 MG/ML IJ SOLN
1.0000 mg | INTRAMUSCULAR | Status: DC | PRN
  Administered 2015-05-03 – 2015-05-04 (×2): 1 mg via INTRAVENOUS
  Filled 2015-05-03 (×2): qty 1

## 2015-05-03 MED ORDER — LEVOFLOXACIN 500 MG PO TABS
500.0000 mg | ORAL_TABLET | Freq: Every day | ORAL | Status: DC
  Administered 2015-05-03 – 2015-05-04 (×2): 500 mg via ORAL
  Filled 2015-05-03 (×2): qty 1

## 2015-05-03 MED ORDER — METOPROLOL TARTRATE 25 MG PO TABS
25.0000 mg | ORAL_TABLET | Freq: Two times a day (BID) | ORAL | Status: DC
  Administered 2015-05-03 – 2015-05-04 (×3): 25 mg via ORAL
  Filled 2015-05-03 (×3): qty 1

## 2015-05-03 MED ORDER — ALPRAZOLAM 0.5 MG PO TABS
0.5000 mg | ORAL_TABLET | Freq: Three times a day (TID) | ORAL | Status: DC | PRN
  Administered 2015-05-03 – 2015-05-04 (×2): 0.5 mg via ORAL
  Filled 2015-05-03 (×2): qty 1

## 2015-05-03 MED ORDER — ONDANSETRON HCL 4 MG/2ML IJ SOLN: 4.0000 mg | Freq: Four times a day (QID) | INTRAMUSCULAR | Status: DC | PRN

## 2015-05-03 MED ORDER — METFORMIN HCL 500 MG PO TABS
500.0000 mg | ORAL_TABLET | Freq: Every day | ORAL | Status: DC
  Administered 2015-05-04: 500 mg via ORAL
  Filled 2015-05-03: qty 1

## 2015-05-03 MED ORDER — LISINOPRIL 5 MG PO TABS
5.0000 mg | ORAL_TABLET | Freq: Every day | ORAL | Status: DC
  Administered 2015-05-03 – 2015-05-04 (×2): 5 mg via ORAL
  Filled 2015-05-03 (×2): qty 1

## 2015-05-03 MED ORDER — SODIUM CHLORIDE 0.9 % IV SOLN
INTRAVENOUS | Status: DC
  Administered 2015-05-03 – 2015-05-04 (×2): via INTRAVENOUS

## 2015-05-03 MED ORDER — INSULIN ASPART 100 UNIT/ML ~~LOC~~ SOLN
0.0000 [IU] | Freq: Three times a day (TID) | SUBCUTANEOUS | Status: DC
  Administered 2015-05-03: 5 [IU] via SUBCUTANEOUS
  Administered 2015-05-04: 3 [IU] via SUBCUTANEOUS
  Administered 2015-05-04: 5 [IU] via SUBCUTANEOUS
  Filled 2015-05-03 (×2): qty 5
  Filled 2015-05-03: qty 3

## 2015-05-03 MED ORDER — ATORVASTATIN CALCIUM 20 MG PO TABS
40.0000 mg | ORAL_TABLET | Freq: Every day | ORAL | Status: DC
  Administered 2015-05-03: 40 mg via ORAL
  Filled 2015-05-03: qty 2

## 2015-05-03 MED ORDER — CITALOPRAM HYDROBROMIDE 20 MG PO TABS
40.0000 mg | ORAL_TABLET | Freq: Every day | ORAL | Status: DC
  Administered 2015-05-03 – 2015-05-04 (×2): 40 mg via ORAL
  Filled 2015-05-03 (×2): qty 2

## 2015-05-03 MED ORDER — ACETAMINOPHEN 325 MG PO TABS
650.0000 mg | ORAL_TABLET | Freq: Four times a day (QID) | ORAL | Status: DC | PRN
  Administered 2015-05-03 – 2015-05-04 (×2): 650 mg via ORAL
  Filled 2015-05-03 (×2): qty 2

## 2015-05-03 MED ORDER — DOCUSATE SODIUM 100 MG PO CAPS
100.0000 mg | ORAL_CAPSULE | Freq: Two times a day (BID) | ORAL | Status: DC
  Administered 2015-05-03 – 2015-05-04 (×3): 100 mg via ORAL
  Filled 2015-05-03 (×3): qty 1

## 2015-05-03 MED ORDER — IPRATROPIUM-ALBUTEROL 0.5-2.5 (3) MG/3ML IN SOLN
3.0000 mL | Freq: Four times a day (QID) | RESPIRATORY_TRACT | Status: DC
  Administered 2015-05-03 – 2015-05-04 (×4): 3 mL via RESPIRATORY_TRACT
  Filled 2015-05-03 (×4): qty 3

## 2015-05-03 MED ORDER — ONDANSETRON HCL 4 MG PO TABS: 4.0000 mg | ORAL_TABLET | Freq: Four times a day (QID) | ORAL | Status: DC | PRN

## 2015-05-03 MED ORDER — ALBUTEROL SULFATE (2.5 MG/3ML) 0.083% IN NEBU: 2.5000 mg | INHALATION_SOLUTION | Freq: Four times a day (QID) | RESPIRATORY_TRACT | Status: DC | PRN

## 2015-05-03 MED ORDER — INSULIN GLARGINE 100 UNIT/ML ~~LOC~~ SOLN
17.0000 [IU] | Freq: Every day | SUBCUTANEOUS | Status: DC
  Administered 2015-05-04: 17 [IU] via SUBCUTANEOUS
  Filled 2015-05-03 (×4): qty 0.17

## 2015-05-03 MED ORDER — ALBUTEROL SULFATE HFA 108 (90 BASE) MCG/ACT IN AERS
2.0000 | INHALATION_SPRAY | Freq: Four times a day (QID) | RESPIRATORY_TRACT | Status: DC | PRN
Start: 2015-05-03 — End: 2015-05-03

## 2015-05-03 MED ORDER — TIOTROPIUM BROMIDE MONOHYDRATE 18 MCG IN CAPS
18.0000 ug | ORAL_CAPSULE | Freq: Every day | RESPIRATORY_TRACT | Status: DC
  Administered 2015-05-03: 18 ug via RESPIRATORY_TRACT
  Filled 2015-05-03: qty 5

## 2015-05-03 NOTE — ED Notes (Signed)
Pt repositioned multple times in bed for comfort. Moist mouth swabs provided for comfort. md in to assess.

## 2015-05-03 NOTE — ED Notes (Signed)
Pt states shob that has been getting worse over last day. Pt states "i'm bleeding from somewhere, i was just in here and they gave me five pints of blood." pt states he feels dizzy, has blurred vision, and "i've been having panic attacks because i can't get air." pt able to speak in full complete sentences. Skin slightly pale.
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
## 2015-05-03 NOTE — Progress Notes (Signed)
Pt can have heart healthy/carb mod diet per Dr. Maryan Char, Beckie Salts

## 2015-05-03 NOTE — Progress Notes (Signed)
Patient admitted to unit. Oriented to room, call bell, and staff. Bed in lowest position. Fall safety plan reviewed. Full assessment to Epic. Will continue to monitor. Skin assessment with Carlyle Dolly RN Toniann Ket

## 2015-05-03 NOTE — ED Notes (Signed)
Pt complains of shob, increasing with exertion, "but i have it all the time." pt states history of "bleeding in my stomach and i needed five pints of blood". Pt denies dark tarry stools or bloody emesis. Pt states "i feel weak, i know when my blood levels start to get low and i get weak, i'm starting to bleed again and i better get on in here and get it checked out." skin slightly pale. Pt with bruising noted to bilateral forearms. Pt denies pain. Pt denies nausea, states has felt dizzy, "and i had blurred vision when i got here but it's gone now." pt states has history of anxiety, "and when i feel anxious, before i start to panic i take a xanax, but tonight it just didn't help."

## 2015-05-03 NOTE — Progress Notes (Signed)
Alert and oriented. Sinus rhythm on telemetry. Rested quietly most of the day. No complaints. Full assessment as charted. 4L O2 at this time.  Toniann Ket
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
## 2015-05-03 NOTE — Evaluation (Signed)
Physical Therapy Evaluation Patient Details Name: SABRI TEAL MRN: [id number] DOB: 1941-05-03 Today's Date: 05/03/2015   History of Present Illness  74 yo male with GI bleed s/p APC of angiectasia.  Clinical Impression  Pt is difficult to keep on task, very interested in finding something to stay off topic to talk about.  He is ultimately able to do some walking in the hallway and though he reports he is fatigued with the effort his o2 remains in the high 90s on 4 liters.  Pt with no LOBs, but does use UEs on hallway rails on occasion.     Follow Up Recommendations Home health PT    Equipment Recommendations  None recommended by PT    Recommendations for Other Services       Precautions / Restrictions Precautions Precautions: Fall Restrictions Weight Bearing Restrictions: No      Mobility  Bed Mobility Overal bed mobility: Modified Independent             General bed mobility comments: Supine to sit swining legs off bed with HOB elevated and using bed rails.  Transfers Overall transfer level: Modified independent Equipment used: None Transfers: Sit to/from Stand Sit to Stand: Modified independent (Device/Increase time)            Ambulation/Gait Ambulation/Gait assistance: Supervision Ambulation Distance (Feet): 150 Feet Assistive device: None   Gait velocity: decreased   General Gait Details: Pt occasionally using rails in hallway, on 4 liters O2 t/o bout with sats remaing in the high 90s  Stairs            Wheelchair Mobility    Modified Rankin (Stroke Patients Only)       Balance                                             Pertinent Vitals/Pain      Home Living Family/patient expects to be discharged to:: Private residence Living Arrangements: Spouse/significant other     Home Access: Stairs to enter Entrance Stairs-Rails: Left Entrance Stairs-Number of Steps: 2 Home Layout: One level Home Equipment:  Environmental consultant - 2 wheels;Shower seat      Prior Function Level of Independence: Independent with assistive device(s)         Comments: Has a walker that he uses as needed.     Hand Dominance        Extremity/Trunk Assessment   Upper Extremity Assessment: Overall WFL for tasks assessed           Lower Extremity Assessment: Overall WFL for tasks assessed         Communication   Communication: Other (comment)  Cognition Arousal/Alertness: Awake/alert Behavior During Therapy: Impulsive;Agitated Overall Cognitive Status: Difficult to assess                      General Comments      Exercises        Assessment/Plan    PT Assessment Patient needs continued PT services  PT Diagnosis Difficulty walking;Generalized weakness   PT Problem List Decreased strength;Decreased range of motion;Decreased activity tolerance;Decreased balance;Decreased mobility;Decreased coordination;Decreased knowledge of use of DME;Cardiopulmonary status limiting activity;Obesity  PT Treatment Interventions DME instruction;Gait training;Stair training;Functional mobility training;Therapeutic activities;Therapeutic exercise;Balance training;Patient/family education   PT Goals (Current goals can be found in the Care Plan section) Acute Rehab PT Goals Patient Stated Goal: "  I want to have Lenell Antu as my PT again" PT Goal Formulation: With patient Time For Goal Achievement: 05/17/15 Potential to Achieve Goals: Fair    Frequency Min 2X/week   Barriers to discharge        Co-evaluation               End of Session Equipment Utilized During Treatment: Gait belt Activity Tolerance: Patient limited by fatigue Patient left: with chair alarm set           Time: 7416-3845 PT Time Calculation (min) (ACUTE ONLY): 18 min   Charges:   PT Evaluation $Initial PT Evaluation Tier I: 1 Procedure     PT G Codes:       Wayne Both, PT, DPT [phone]  Kreg Shropshire 05/03/2015,  5:40 PM
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
## 2015-05-03 NOTE — ED Notes (Signed)
Assisted md with rectal exam.  

## 2015-05-04 ENCOUNTER — Other Ambulatory Visit: Payer: Self-pay | Admitting: Physician Assistant

## 2015-05-04 DIAGNOSIS — R0602 Shortness of breath: Secondary | ICD-10-CM | POA: Diagnosis not present

## 2015-05-04 LAB — BASIC METABOLIC PANEL
Anion gap: 6 (ref 5–15)
BUN: 9 mg/dL (ref 6–20)
CO2: 30 mmol/L (ref 22–32)
CREATININE: 1.01 mg/dL (ref 0.61–1.24)
Calcium: 7.9 mg/dL — ABNORMAL LOW (ref 8.9–10.3)
Chloride: 104 mmol/L (ref 101–111)
GFR calc Af Amer: >60 mL/min (ref >60)
GFR calc non Af Amer: >60 mL/min (ref >60)
GLUCOSE: 321 mg/dL — AB (ref 65–99)
POTASSIUM: 4.1 mmol/L (ref 3.5–5.1)
Sodium: 140 mmol/L (ref 135–145)

## 2015-05-04 LAB — GLUCOSE, CAPILLARY
GLUCOSE-CAPILLARY: 226 mg/dL — AB (ref 65–99)
Glucose-Capillary: 192 mg/dL — ABNORMAL HIGH (ref 65–99)

## 2015-05-04 LAB — TROPONIN I: TROPONIN I: 0.03 ng/mL (ref <0.031)

## 2015-05-04 LAB — CBC
HCT: 25.2 % — ABNORMAL LOW (ref 40.0–52.0)
Hemoglobin: 7.8 g/dL — ABNORMAL LOW (ref 13.0–18.0)
MCH: 27 pg (ref 26.0–34.0)
MCHC: 31.2 g/dL — ABNORMAL LOW (ref 32.0–36.0)
MCV: 86.7 fL (ref 80.0–100.0)
Platelets: 263 10*3/uL (ref 150–440)
RBC: 2.9 MIL/uL — ABNORMAL LOW (ref 4.40–5.90)
RDW: 17.1 % — AB (ref 11.5–14.5)
WBC: 5.2 10*3/uL (ref 3.8–10.6)

## 2015-05-04 MED ORDER — TIOTROPIUM BROMIDE MONOHYDRATE 18 MCG IN CAPS
18.0000 ug | ORAL_CAPSULE | Freq: Every day | RESPIRATORY_TRACT | Status: DC
  Administered 2015-05-04: 18 ug via RESPIRATORY_TRACT
  Filled 2015-05-04: qty 5

## 2015-05-04 NOTE — Progress Notes (Signed)
Patient: [name] / Admit Date: 05/03/2015 / Date of Encounter: 05/04/2015, 9:45 AM   Subjective: No chest pain or palpitations. Back on 3L via . Reports SOB with minimal exertion - this is a long standing issue for him, at least 1.5 years in the making. Echo showed EF 55-60%, no regional wall motion abnormalities, GR1DD, mild aortic regurgitation. HGB 7.8 today. He has previously required multiple transfusions in the past s/p GIB with hgb recently running in the 6-8 range s/p multiple transfusions and recently hospitalizations.    Review of Systems: Review of Systems  Constitutional: Positive for weight loss and malaise/fatigue. Negative for fever, chills and diaphoresis.  HENT: Negative for congestion.   Eyes: Negative for blurred vision, discharge and redness.  Respiratory: Positive for shortness of breath. Negative for cough, hemoptysis, sputum production and wheezing.   Cardiovascular: Negative for chest pain, palpitations, orthopnea, claudication, leg swelling and PND.  Gastrointestinal: Positive for melena. Negative for heartburn, nausea, vomiting and blood in stool.  Musculoskeletal: Negative for falls.  Skin: Negative for rash.  Neurological: Positive for weakness. Negative for dizziness, tingling, tremors, sensory change, speech change, focal weakness and loss of consciousness.  Endo/Heme/Allergies: Does not bruise/bleed easily.  Psychiatric/Behavioral: Negative for depression. The patient is not nervous/anxious.   All other systems reviewed and are negative.   Objective: Telemetry: NSR, 80's Physical Exam: Blood pressure 130/77, pulse 82, temperature 98.1 F (36.7 C), temperature source Oral, resp. rate 20, height 6\' 2"  (1.88 m), weight 266 lb 1.6 oz (120.702 kg), SpO2 98 %. Body mass index is 34.15 kg/(m^2). General: Well developed, well nourished, in no acute distress. Head: Normocephalic, atraumatic, sclera non-icteric, no xanthomas, nares are without  discharge. Neck: Negative for carotid bruits. JVP not elevated. Lungs: Coarse breath sounds bilaterally with diffuse rhonchi bilaterally. Breathing is unlabored. Heart: RRR S1 S2, SEM, rubs, or gallops.  Abdomen: Obese, soft, non-tender, non-distended with normoactive bowel sounds. No rebound/guarding. Extremities: No clubbing or cyanosis. No edema.  Neuro: Alert and oriented X 3. Moves all extremities spontaneously. Psych:  Responds to questions appropriately with a normal affect.   Intake/Output Summary (Last 24 hours) at 05/04/15 0945 Last data filed at 05/04/15 0700  Gross per 24 hour  Intake 371.67 ml  Output   2050 ml  Net -1678.33 ml    Inpatient Medications:  . atorvastatin  40 mg Oral QHS  . budesonide-formoterol  2 puff Inhalation BID  . citalopram  40 mg Oral Daily  . divalproex  500 mg Oral QHS  . docusate sodium  100 mg Oral BID  . folic acid  2 mg Oral q morning - 10a  . insulin aspart  0-15 Units Subcutaneous TID WC  . insulin aspart  0-5 Units Subcutaneous QHS  . insulin aspart  10 Units Subcutaneous TID WC  . insulin glargine  17 Units Subcutaneous Daily  . ipratropium-albuterol  3 mL Nebulization QID  . iron polysaccharides  150 mg Oral BID  . levofloxacin  500 mg Oral Daily  . lisinopril  5 mg Oral Daily  . metFORMIN  500 mg Oral Q breakfast  . metoprolol tartrate  25 mg Oral BID  . pantoprazole  40 mg Oral BID  . sucralfate  1 g Oral TID WC & HS  . tiotropium  18 mcg Inhalation Daily  . traZODone  50 mg Oral TID   Infusions:  . sodium chloride 50 mL/hr at 05/04/15 0305    Labs:  Recent Labs  05/03/15  9735 05/04/15 0026  NA 140 140  K 3.9 4.1  CL 101 104  CO2 33* 30  GLUCOSE 200* 321*  BUN 12 9  CREATININE 0.89 1.01  CALCIUM 8.3* 7.9*    Recent Labs  05/03/15 0628  AST 15  ALT 15*  ALKPHOS 87  BILITOT 0.4  PROT 5.7*  ALBUMIN 2.9*    Recent Labs  05/03/15 0628 05/04/15 0026  WBC 6.9 5.2  HGB 8.5* 7.8*  HCT 26.9* 25.2*   MCV 86.8 86.7  PLT 244 263    Recent Labs  05/03/15 0628 05/03/15 1244 05/03/15 1835 05/04/15 0026  TROPONINI 0.03 0.03 0.03 0.03   Invalid input(s): POCBNP No results for input(s): HGBA1C in the last 72 hours.   Weights: Filed Weights   05/03/15 0553 05/03/15 1227 05/04/15 0551  Weight: 262 lb (118.842 kg) 264 lb 4.8 oz (119.886 kg) 266 lb 1.6 oz (120.702 kg)     Radiology/Studies:  Ct Angio Chest Pe W/cm &/or Wo Cm  05/03/2015   CLINICAL DATA:  Increase shortness of breath of the last 2 days. Dizziness. History of CABG, COPD, AAA repair, diabetes, CHF, CAD, AVM.  EXAM: CT ANGIOGRAPHY CHEST WITH CONTRAST  TECHNIQUE: Multidetector CT imaging of the chest was performed using the standard protocol during bolus administration of intravenous contrast. Multiplanar CT image reconstructions and MIPs were obtained to evaluate the vascular anatomy.  CONTRAST:  71mL OMNIPAQUE IOHEXOL 350 MG/ML SOLN  COMPARISON:  04/23/2015  FINDINGS: Heart: Heart is mildly enlarged. No pericardial effusion. Significant coronary artery disease. Previous median sternotomy and CABG.  Vascular structures: Pulmonary arteries are well opacified. There is no evidence for acute pulmonary embolus. There is atherosclerotic calcification of the thoracic arch not associated with aneurysm or dissection.  Mediastinum/thyroid: The visualized portion of the thyroid gland has a normal appearance. Small mediastinal lymph nodes are noted, nonspecific in appearance. Large hiatal hernia is present.  Lungs/Airways: Emphysematous changes are present. Large bulla in the left lung apex again noted. No focal consolidations or pleural effusions. There is mild bronchiectasis in the left lung base. No suspicious pulmonary nodules.  Upper abdomen: Large hiatal hernia.  Colonic diverticulosis.  Chest wall/osseous structures: Status post median sternotomy. No suspicious lytic or blastic lesions are identified. Chronic left rib fracture.  Review of  the MIP images confirms the above findings.  IMPRESSION: 1. Technically adequate exam showing no pulmonary embolus. 2. Cardiomegaly and significant coronary artery disease. 3. Emphysematous changes.  Bronchiectasis left lung base. 4. Large hiatal hernia. 5. Colonic diverticulosis.   Electronically Signed   By: Nolon Nations M.D.   On: 05/03/2015 09:34   Ct Abdomen Pelvis W Contrast  04/22/2015   CLINICAL DATA:  Generalized abdominal pain and melena.  EXAM: CT ABDOMEN AND PELVIS WITH CONTRAST  TECHNIQUE: Multidetector CT imaging of the abdomen and pelvis was performed using the standard protocol following bolus administration of intravenous contrast.  CONTRAST:  176mL OMNIPAQUE IOHEXOL 300 MG/ML  SOLN  COMPARISON:  12/29/2014  FINDINGS: BODY WALL: Fatty bilateral inguinal hernias with early herniation of small bowel on the right.  LOWER CHEST: Large sliding hiatal hernia without obstruction or inflammatory change.  Mild atelectasis at the left base.  Coronary atherosclerosis.  ABDOMEN/PELVIS:  Liver: No focal abnormality.  Biliary: No evidence of biliary obstruction or stone.  Pancreas: Unremarkable.  Spleen: Unremarkable.  Adrenals: Unremarkable.  Kidneys and ureters: No hydronephrosis or stone. Stable scarring to the lower pole right kidney, likely from covering of a lower pole  accessory renal artery.  Bladder: Unremarkable.  Reproductive: No pathologic findings.  Bowel: Appendectomy. There is colonic diverticulosis, present mainly distally. Large sliding hiatal hernia. No inflammatory bowel wall thickening. A small distal duodenum diverticulum is noted, uncomplicated. No evidence of peptic ulcer disease, aortoenteric fistula or other explanation for melena.  Retroperitoneum: No mass or adenopathy.  Peritoneum: No ascites or pneumoperitoneum.  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 No evidence of major vessel stenosis or occlusion.  Small volume gas within the right common femoral vein is presumably from vascular access. There is been right hypogastric artery embolization.  OSSEOUS: No acute abnormalities.  IMPRESSION: 1. No acute finding or explanation for melena. 2. Incidental findings are stable from prior study and noted above.   Electronically Signed   By: Monte Fantasia M.D.   On: 04/22/2015 03:55   Ct Chest High Resolution  04/23/2015   CLINICAL DATA:  74 year old male with history of cough producing blackish phlegm. History of GI bleeding.  EXAM: CT CHEST WITHOUT CONTRAST  TECHNIQUE: Multidetector CT imaging of the chest was performed following the standard protocol without intravenous contrast. High resolution imaging of the lungs, as well as inspiratory and expiratory imaging, was performed.  COMPARISON:  Chest CT 05/15/2013.  FINDINGS: Mediastinum/Lymph Nodes: Heart size is normal. There is no significant pericardial fluid, thickening or pericardial calcification. There is atherosclerosis of the thoracic aorta, the great vessels of the mediastinum and the coronary arteries, including calcified atherosclerotic plaque in the left main, left anterior descending and right coronary arteries. Status post median sternotomy for CABG, including LIMA to the LAD. No pathologically enlarged mediastinal or hilar lymph nodes. Please note that accurate exclusion of hilar adenopathy is limited on noncontrast CT scans. Large hiatal hernia. No axillary lymphadenopathy.  Lungs/Pleura: There is some focal architectural distortion, what appears to be chronic volume loss, and some mild peribronchovascular ground-glass attenuation in the left lower lobe, which is favored to reflect a combination of chronic scarring and atelectasis, as well as some peribronchovascular inflammation and mild fibrosis, potentially related to chronic recurrent aspiration in the setting of this large hiatal hernia. High-resolution images otherwise demonstrate no significant  additional areas of ground-glass attenuation, subpleural reticulation, parenchymal banding, traction bronchiectasis or frank honeycombing in other portions of the lungs to suggest a background of interstitial lung disease. Inspiratory and expiratory imaging is unremarkable. Mild diffuse bronchial wall thickening with generally mild centrilobular and paraseptal emphysema. There is a large bulla in the apex of the left upper lobe. No acute consolidative airspace disease. No pleural effusions. No suspicious appearing pulmonary nodules or masses.  Upper Abdomen: Unremarkable.  Musculoskeletal/Soft Tissues: Median sternotomy wires. There are no aggressive appearing lytic or blastic lesions noted in the visualized portions of the skeleton.  IMPRESSION: 1. There are no findings to strongly suggest interstitial lung disease on today's examination. 2. There is a spectrum of findings in the left lower lobe, suggestive of some chronic scarring, atelectasis and likely mild fibrosis related to recurrent aspiration or prior infection, as discussed above. 3. Large hiatal hernia. 4. Mild diffuse bronchial wall thickening with mild centrilobular and paraseptal emphysema, as well as a large left apical bulla. 5. Atherosclerosis, including left main and 3 vessel coronary artery disease. Status post median sternotomy for CABG, including LIMA to the LAD.   Electronically Signed   By: Vinnie Langton M.D.   On: 04/23/2015 13:14   Dg Chest Portable 1 View  05/03/2015  CLINICAL DATA:  Worsening shortness of breath. History of COPD, chest pain, CHF, diabetes, hypertension.  EXAM: PORTABLE CHEST - 1 VIEW  COMPARISON:  Chest radiograph April 22, 2015 and, CT chest April 23, 2015  FINDINGS: The cardiac silhouette appears moderately enlarged, status post median sternotomy. Increased lung volumes, consistent with history of COPD without pleural effusion or focal consolidation. Stable LEFT lung base atelectasis. Apical pleural thickening. No  pneumothorax. Soft tissue planes and included osseous structure nonsuspicious.  IMPRESSION: Stable cardiomegaly and COPD.  Stable LEFT lung base atelectasis.   Electronically Signed   By: Elon Alas M.D.   On: 05/03/2015 06:32   Dg Chest Portable 1 View  04/22/2015   CLINICAL DATA:  Acute onset of bilateral chest pain and generalized abdominal pain. Shortness of breath. Initial encounter.  EXAM: PORTABLE CHEST - 1 VIEW  COMPARISON:  Chest radiograph performed 04/10/2015  FINDINGS: The lungs are well-aerated. A small left pleural effusion is noted. Pulmonary vascularity is at the upper limits of normal. Left basilar opacity likely reflects atelectasis. There is no evidence of pneumothorax.  The cardiomediastinal silhouette is mildly enlarged. The patient is status post median sternotomy. No acute osseous abnormalities are seen.  IMPRESSION: Small left pleural effusion noted. Mild cardiomegaly. Persistent mild left basilar airspace opacity likely reflects atelectasis.   Electronically Signed   By: Garald Balding M.D.   On: 04/22/2015 01:19   Dg Chest Port 1 View  04/10/2015   CLINICAL DATA:  Acute onset of cough and difficulty breathing. Initial encounter.  EXAM: PORTABLE CHEST - 1 VIEW  COMPARISON:  Chest radiograph performed 12/04/2013  FINDINGS: The lungs are well-aerated. Mild left basilar opacity likely reflects atelectasis. There is no evidence of pleural effusion or pneumothorax.  The cardiomediastinal silhouette is mildly enlarged. The patient is status post median sternotomy. No acute osseous abnormalities are seen.  IMPRESSION: Mild left basilar opacity likely reflects atelectasis. Mild cardiomegaly.   Electronically Signed   By: Garald Balding M.D.   On: 04/10/2015 05:59     Assessment and Plan  1. Chest Pain:  -Seems related to cough and work of breathing, initially requiring increased oxygen via nasal cannula -CTA chest negative for PE -Unable to perform treadmill, Lexiscan, or  dobutamine stress tests 2/2 his chronic respiratory failure on 3L via nasal cannula  -History ongoing GIB with anemia as low as 6, currently at 7.8, would not pursue LHC at this time as he would not be a candidate for DAPT -Enzymes negative x 4  2. Chronic respiratory failure: -Secondary to COPD -CXR with no pneumonia continue current meds and cough control antibiotics / steroids -Per primary service  3. History of GIB/gastritis: -Per IM/GI  4. Anemia:  -Hct low / stable -Guaiac stools, pending  -Continue GI meds -Transfuse prn -Symptoms of ongoing fatigue likely 2/2 #'s 2, 3, and 4. Though unable to fully rule out ischemia. Unfortunately, he cannot undergo ischemic evaluation given his comorbidities of chronic respiratory failure (he refuses stress test) and he is not a cath candidate with ongoing GIB issues    Signed, Christell Faith, PA-C Pager: [phone] 05/04/2015, 9:45 AM

## 2015-05-04 NOTE — Care Management (Signed)
Patient has not been established with a PCP.    He has an appointment with [facility] Specking tomorrow.  Dr Ouida Sills declined to follow for home health until he sees patient.  Patient and his wife are aware of this.

## 2015-05-04 NOTE — Care Management (Signed)
Asked attending if he would follow for home health until patient gets established with PCP and he declined.  Reviewed this patient to request the home health  when he see Dr Ouida Sills tomorrow.  Verbalized understanding

## 2015-05-04 NOTE — Discharge Instructions (Signed)
Low sodium, low fat and ADA diet. Activity as tolerated. Continue home O2 Finger. Need HH. Follow up Dr. Ouida Sills to set up [facility] tomorrow.

## 2015-05-04 NOTE — Clinical Social Work Note (Signed)
CSW acknowledges consult.  PT recommends Home Health.  RNCM aware.  CSW signing off unless further needs arise.

## 2015-05-04 NOTE — Discharge Summary (Signed)
[facility] NAME: [name]    MR#:  [id number]  DATE OF BIRTH:  [birth year]  DATE OF ADMISSION:  05/03/2015 ADMITTING PHYSICIAN: Idelle Crouch, MD  DATE OF DISCHARGE: 05/04/2015 PRIMARY CARE PHYSICIAN:  Dr. Ouida Sills.   ADMISSION DIAGNOSIS:  Dyspnea [R06.00] COPD exacerbation [J44.1]   DISCHARGE DIAGNOSIS:  Atypical chest pain. Chronic respiratory failure on home oxygen 3 L. SECONDARY DIAGNOSIS:   Past Medical History  Diagnosis Date  . Allergic rhinitis, cause unspecified   . Other and unspecified hyperlipidemia   . 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 Unspecified essential hypertension   . 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 Severe bullous disease of LUL. Prolonged intubation after surgeries due to COPD  . 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 Anemia     Recurrent microcytic, presumably GI   . Complication of anesthesia     trouble getting off ventilator  . Memory loss   . Diabetes   . CHF (congestive heart failure)   . CAD (coronary artery disease)   . GI bleed requiring more than 4 units of blood in 24 hours, ICU, or surgery     Hx bleeding gastric polyps, cecal & sigmoid AVMS s/p APC 03/30/14  . AVM (arteriovenous malformation) of colon with hemorrhage   . Multiple gastric polyps   . Diverticulosis     HOSPITAL COURSE:   The patient has multiple medical problems. He was admitted for chest pain yesterday. He still complains of chest pain over the chest. His troponin level and negative. CT angiogram of his chest didn't show any PE. Cardiologist suggested no indication fo cardiac cath or non-invasive testing. The patient has multiple medical problems need home health. According to case manager, patient did need to see Dr.  Ouida Sills and then set up home health. He also need to follow-up with Dr. Raul Del as outpatient.  DISCHARGE CONDITIONS:   Stable and discharge home today. The patient the need to follow-up Dr. Frazier Richards to set up home health.  CONSULTS OBTAINED:  Treatment Team:  Josue Hector, MD  DRUG ALLERGIES:   Allergies  Allergen Reactions  . Penicillins Anaphylaxis and Hives  . Demerol [Meperidine] Other (See Comments)    hallucinations  . Dilaudid [Hydromorphone Hcl] Other (See Comments)    hallucinations    DISCHARGE MEDICATIONS:   Current Discharge Medication List    CONTINUE these medications which have NOT CHANGED   Details  acetaminophen (TYLENOL) 325 MG tablet Take 2 tablets (650 mg total) by mouth every 6 (six) hours as needed for mild pain (or Fever >/= 101).    albuterol (PROVENTIL HFA;VENTOLIN HFA) 108 (90 BASE) MCG/ACT inhaler Inhale 2 puffs into the lungs every 6 (six) hours as needed for wheezing.    ALPRAZolam (XANAX) 0.5 MG tablet Take 1 tablet (0.5 mg total) by mouth 3 (three) times daily as needed for anxiety (for shortness of breath). Qty: 20 tablet, Refills: 2    atorvastatin (LIPITOR) 40 MG tablet Take 1 tablet (40 mg total) by mouth at bedtime. <please make appointment for refills> Qty: 90 tablet, Refills: 0    bisacodyl (DULCOLAX) 5 MG EC tablet Take 1 tablet (5 mg total) by mouth daily as needed for moderate constipation. Qty: 30 tablet, Refills:  0    budesonide-formoterol (SYMBICORT) 160-4.5 MCG/ACT inhaler Inhale 2 puffs into the lungs 2 (two) times daily. Qty: 1 Inhaler, Refills: 11    Calcium Carbonate Antacid (ROLAIDS EXTRA STRENGTH PO) Take 2 tablets by mouth daily as needed (indigestion).    citalopram (CELEXA) 40 MG tablet TAKE 1 TABLET (40 MG TOTAL) BY MOUTH DAILY. Qty: 30 tablet, Refills: 5    divalproex (DEPAKOTE) 500 MG DR tablet Take 1 tablet (500 mg total) by mouth at bedtime. Qty: 1 tablet, Refills: 0    docusate sodium (COLACE)  100 MG capsule Take 1 capsule (100 mg total) by mouth 2 (two) times daily. Qty: 10 capsule, Refills: 0    folic acid (FOLVITE) 1 MG tablet Take 2 tablets (2 mg total) by mouth every morning. Need appointment before anymore refills Qty: 60 tablet, Refills: 0    !! insulin aspart (NOVOLOG) 100 UNIT/ML injection Inject 0-15 Units into the skin 4 (four) times daily -  before meals and at bedtime. Qty: 10 mL, Refills: 11    !! insulin aspart (NOVOLOG) 100 UNIT/ML injection Inject 10 Units into the skin 3 (three) times daily with meals. Qty: 10 mL, Refills: 11    insulin glargine (LANTUS) 100 UNIT/ML injection Inject 0.17 mLs (17 Units total) into the skin daily. Qty: 10 mL, Refills: 11    iron polysaccharides (NIFEREX) 150 MG capsule Take 150 mg by mouth 2 (two) times daily.    lisinopril (PRINIVIL,ZESTRIL) 5 MG tablet Take 5 mg by mouth daily.     metFORMIN (GLUCOPHAGE) 500 MG tablet Take 1 tablet (500 mg total) by mouth daily. Qty: 30 tablet, Refills: 1    metoprolol tartrate (LOPRESSOR) 25 MG tablet Take 1 tablet (25 mg total) by mouth 2 (two) times daily. Need appointment before anymore refills Qty: 60 tablet, Refills: 0    pantoprazole (PROTONIX) 40 MG tablet Take 1 tablet (40 mg total) by mouth 2 (two) times daily. Need appointment before anymore refills Qty: 60 tablet, Refills: 0    sucralfate (CARAFATE) 1 G tablet Take 1 tablet (1 g total) by mouth 4 (four) times daily -  with meals and at bedtime. Qty: 90 tablet, Refills: 0    tiotropium (SPIRIVA) 18 MCG inhalation capsule Place 1 capsule (18 mcg total) into inhaler and inhale daily. Qty: 30 capsule, Refills: 3    traZODone (DESYREL) 50 MG tablet Take 1 tablet (50 mg total) by mouth 3 (three) times daily. Qty: 90 tablet, Refills: 0    Vitamin D, Ergocalciferol, (DRISDOL) 50000 UNITS CAPS capsule TAKE 1 CAPSULE (50,000 UNITS TOTAL) BY MOUTH ONCE A WEEK.  'OV NEEDED" Qty: 12 capsule, Refills: 0    zolpidem (AMBIEN) 5 MG  tablet TAKE 1 TABLET BY MOUTH AT BEDTIME AS NEEDED FOR SLEEP Qty: 30 tablet, Refills: 5     !! - Potential duplicate medications found. Please discuss with provider.    STOP taking these medications     levofloxacin (LEVAQUIN) 500 MG tablet          DISCHARGE INSTRUCTIONS:    If you experience worsening of your admission symptoms, develop shortness of breath, life threatening emergency, suicidal or homicidal thoughts you must seek medical attention immediately by calling 911 or calling your MD immediately  if symptoms less severe.  You Must read complete instructions/literature along with all the possible adverse reactions/side effects for all the Medicines you take and that have been prescribed to you. Take any new Medicines after you have completely understood and  accept all the possible adverse reactions/side effects.   Please note  You were cared for by a hospitalist during your hospital stay. If you have any questions about your discharge medications or the care you received while you were in the hospital after you are discharged, you can call the unit and asked to speak with the hospitalist on call if the hospitalist that took care of you is not available. Once you are discharged, your primary care physician will handle any further medical issues. Please note that NO REFILLS for any discharge medications will be authorized once you are discharged, as it is imperative that you return to your primary care physician (or establish a relationship with a primary care physician if you do not have one) for your aftercare needs so that they can reassess your need for medications and monitor your lab values.    Today   SUBJECTIVE   Tenderness across the chest.   VITAL SIGNS:  Blood pressure 138/62, pulse 87, temperature 97.9 F (36.6 C), temperature source Oral, resp. rate 20, height 6\' 2"  (1.88 m), weight 120.702 kg (266 lb 1.6 oz), SpO2 93 %.  I/O:   Intake/Output Summary (Last  24 hours) at 05/04/15 1343 Last data filed at 05/04/15 1134  Gross per 24 hour  Intake 611.67 ml  Output   2450 ml  Net -1838.33 ml    PHYSICAL EXAMINATION:  GENERAL:  74 y.o.-year-old patient lying in the bed with no acute distress.  EYES: Pupils equal, round, reactive to light and accommodation. No scleral icterus. Extraocular muscles intact.  HEENT: Head atraumatic, normocephalic. Oropharynx and nasopharynx clear.  NECK:  Supple, no jugular venous distention. No thyroid enlargement, no tenderness.  LUNGS: Diminished breath sounds bilaterally, no wheezing, rales,rhonchi or crepitation. No use of accessory muscles of respiration.  CARDIOVASCULAR: S1, S2 normal. No murmurs, rubs, or gallops.  ABDOMEN: Soft, non-tender, non-distended. Bowel sounds present. No organomegaly or mass.  EXTREMITIES: No pedal edema, cyanosis, or clubbing.  NEUROLOGIC: Cranial nerves II through XII are intact. Muscle strength 5/5 in all extremities. Sensation intact. Gait not checked.  PSYCHIATRIC: The patient is alert and oriented x 3.  SKIN: No obvious rash, lesion, or ulcer.   DATA REVIEW:   CBC  Recent Labs Lab 05/04/15 0026  WBC 5.2  HGB 7.8*  HCT 25.2*  PLT 263    Chemistries   Recent Labs Lab 05/03/15 0628 05/04/15 0026  NA 140 140  K 3.9 4.1  CL 101 104  CO2 33* 30  GLUCOSE 200* 321*  BUN 12 9  CREATININE 0.89 1.01  CALCIUM 8.3* 7.9*  AST 15  --   ALT 15*  --   ALKPHOS 87  --   BILITOT 0.4  --     Cardiac Enzymes  Recent Labs Lab 05/04/15 0026  TROPONINI 0.03    Microbiology Results  Results for orders placed or performed during the hospital encounter of 03/09/15  C difficile quick scan w PCR reflex [facility])     Status: None   Collection Time: 03/10/15 12:27 PM  Result Value Ref Range Status   C Diff antigen NEGATIVE  Final   C Diff toxin NEGATIVE  Final   C Diff interpretation Negative for C. difficile  Final    RADIOLOGY:  Ct Angio Chest Pe W/cm &/or Wo  Cm  05/03/2015   CLINICAL DATA:  Increase shortness of breath of the last 2 days. Dizziness. History of CABG, COPD, AAA repair, diabetes, CHF, CAD, AVM.  EXAM: CT ANGIOGRAPHY CHEST WITH CONTRAST  TECHNIQUE: Multidetector CT imaging of the chest was performed using the standard protocol during bolus administration of intravenous contrast. Multiplanar CT image reconstructions and MIPs were obtained to evaluate the vascular anatomy.  CONTRAST:  65mL OMNIPAQUE IOHEXOL 350 MG/ML SOLN  COMPARISON:  04/23/2015  FINDINGS: Heart: Heart is mildly enlarged. No pericardial effusion. Significant coronary artery disease. Previous median sternotomy and CABG.  Vascular structures: Pulmonary arteries are well opacified. There is no evidence for acute pulmonary embolus. There is atherosclerotic calcification of the thoracic arch not associated with aneurysm or dissection.  Mediastinum/thyroid: The visualized portion of the thyroid gland has a normal appearance. Small mediastinal lymph nodes are noted, nonspecific in appearance. Large hiatal hernia is present.  Lungs/Airways: Emphysematous changes are present. Large bulla in the left lung apex again noted. No focal consolidations or pleural effusions. There is mild bronchiectasis in the left lung base. No suspicious pulmonary nodules.  Upper abdomen: Large hiatal hernia.  Colonic diverticulosis.  Chest wall/osseous structures: Status post median sternotomy. No suspicious lytic or blastic lesions are identified. Chronic left rib fracture.  Review of the MIP images confirms the above findings.  IMPRESSION: 1. Technically adequate exam showing no pulmonary embolus. 2. Cardiomegaly and significant coronary artery disease. 3. Emphysematous changes.  Bronchiectasis left lung base. 4. Large hiatal hernia. 5. Colonic diverticulosis.   Electronically Signed   By: Nolon Nations M.D.   On: 05/03/2015 09:34   Dg Chest Portable 1 View  05/03/2015   CLINICAL DATA:  Worsening shortness of  breath. History of COPD, chest pain, CHF, diabetes, hypertension.  EXAM: PORTABLE CHEST - 1 VIEW  COMPARISON:  Chest radiograph April 22, 2015 and, CT chest April 23, 2015  FINDINGS: The cardiac silhouette appears moderately enlarged, status post median sternotomy. Increased lung volumes, consistent with history of COPD without pleural effusion or focal consolidation. Stable LEFT lung base atelectasis. Apical pleural thickening. No pneumothorax. Soft tissue planes and included osseous structure nonsuspicious.  IMPRESSION: Stable cardiomegaly and COPD.  Stable LEFT lung base atelectasis.   Electronically Signed   By: Elon Alas M.D.   On: 05/03/2015 06:32        Management plans discussed with the patient, family and they are in agreement.  CODE STATUS:     Code Status Orders        Start     Ordered   05/03/15 1230  Full code   Continuous     05/03/15 1229      TOTAL TIME TAKING CARE OF THIS PATIENT: 36 minutes.    Demetrios Loll M.D on 05/04/2015 at 1:43 PM  Between 7am to 6pm - Pager - [phone]  After 6pm go to [email or web address] - password [email or web address]  Office  [phone]  CC: Primary care physician; Dustin Flock, MD

## 2015-05-05 NOTE — Progress Notes (Signed)
   05/03/15 1700  PT Visit Information  Last PT Received On 05/03/15  Assistance Needed +1  History of Present Illness 74 yo male with GI bleed s/p APC of angiectasia.  Precautions  Precautions Fall  Restrictions  Weight Bearing Restrictions No  Home Living  Family/patient expects to be discharged to: Private residence  Living Arrangements Spouse/significant other  [place] to enter  Entrance Stairs-Number of Steps 2  [place] One level  [place] - 2 wheels;Shower seat  Prior Function  Level of Independence Independent with assistive device(s)  Comments Has a walker that he uses as needed.  Communication  Communication Other (comment)  Cognition  Arousal/Alertness Awake/alert  Behavior During Therapy Impulsive;Agitated  Overall Cognitive Status Difficult to assess  Upper Extremity Assessment  Upper Extremity Assessment Overall WFL for tasks assessed  Lower Extremity Assessment  Lower Extremity Assessment Overall WFL for tasks assessed  Bed Mobility  Overal bed mobility Modified Independent  General bed mobility comments Supine to sit swining legs off bed with HOB elevated and using bed rails.  Transfers  Overall transfer level Modified independent  Equipment used None  Transfers Sit to/from Stand  Sit to Stand Modified independent (Device/Increase time)  Ambulation/Gait  Ambulation/Gait assistance Supervision  Ambulation Distance (Feet) 150 Feet  Assistive device None  General Gait Details Pt occasionally using rails in hallway, on 4 liters O2 t/o bout with sats remaing in the high 90s  Gait velocity decreased  PT - End of Session  Equipment Utilized During Treatment Gait belt  Activity Tolerance Patient limited by fatigue  Patient left with chair alarm set  PT Assessment  PT Therapy Diagnosis  Difficulty walking;Generalized weakness  PT Recommendation/Assessment Patient needs continued PT services  PT Problem  List Decreased strength;Decreased range of motion;Decreased activity tolerance;Decreased balance;Decreased mobility;Decreased coordination;Decreased knowledge of use of DME;Cardiopulmonary status limiting activity;Obesity  PT Plan  PT Frequency (ACUTE ONLY) Min 2X/week  PT Treatment/Interventions (ACUTE ONLY) DME instruction;Gait training;Stair training;Functional mobility training;Therapeutic activities;Therapeutic exercise;Balance training;Patient/family education  PT Recommendation  Follow Up Recommendations Home health PT  PT equipment None recommended by PT  Individuals Consulted  Consulted and Agree with Results and Recommendations Patient  Acute Rehab PT Goals  Patient Stated Goal "I want to have Lenell Antu as my PT again"  PT Goal Formulation With patient  Time For Goal Achievement 05/17/15  Potential to Achieve Goals Fair  PT Time Calculation  PT Start Time (ACUTE ONLY) 1703  PT Stop Time (ACUTE ONLY) 1721  PT Time Calculation (min) (ACUTE ONLY) 18 min  PT G-Codes **NOT FOR INPATIENT CLASS**  Functional Assessment Tool Used clinical judgment  Functional Limitation Mobility: Walking and moving around  Mobility: Walking and Moving Around Current Status (Y4825) CI  Mobility: Walking and Moving Around Goal Status (O0370) CI  PT General Charges  $$ ACUTE PT VISIT 1 Procedure  PT Evaluation  $Initial PT Evaluation Tier I 1 Procedure    Late entry for missed G-code. Based on review of the evaluation and goals by Wayne Both PT, DPT.  Lady Deutscher PT, DPT 05/05/2015  1:48 PM

## 2015-05-06 ENCOUNTER — Other Ambulatory Visit: Payer: Self-pay

## 2015-05-06 ENCOUNTER — Encounter: Payer: Self-pay | Admitting: Emergency Medicine

## 2015-05-06 ENCOUNTER — Telehealth: Payer: Self-pay | Admitting: Pulmonary Disease

## 2015-05-06 ENCOUNTER — Emergency Department: Payer: Commercial Managed Care - HMO

## 2015-05-06 ENCOUNTER — Inpatient Hospital Stay
Admission: EM | Admit: 2015-05-06 | Discharge: 2015-05-13 | DRG: 377 | Disposition: A | Discharge status: Home / Self Care | Payer: Commercial Managed Care - HMO | Attending: Internal Medicine | Admitting: Internal Medicine

## 2015-05-06 DIAGNOSIS — F410 Panic disorder [episodic paroxysmal anxiety] without agoraphobia: Secondary | ICD-10-CM | POA: Diagnosis present

## 2015-05-06 DIAGNOSIS — F418 Other specified anxiety disorders: Secondary | ICD-10-CM | POA: Diagnosis present

## 2015-05-06 DIAGNOSIS — I252 Old myocardial infarction: Secondary | ICD-10-CM | POA: Diagnosis not present

## 2015-05-06 DIAGNOSIS — I2510 Atherosclerotic heart disease of native coronary artery without angina pectoris: Secondary | ICD-10-CM | POA: Diagnosis present

## 2015-05-06 DIAGNOSIS — Z825 Family history of asthma and other chronic lower respiratory diseases: Secondary | ICD-10-CM | POA: Diagnosis not present

## 2015-05-06 DIAGNOSIS — J9610 Chronic respiratory failure, unspecified whether with hypoxia or hypercapnia: Secondary | ICD-10-CM | POA: Diagnosis present

## 2015-05-06 DIAGNOSIS — K922 Gastrointestinal hemorrhage, unspecified: Secondary | ICD-10-CM | POA: Diagnosis present

## 2015-05-06 DIAGNOSIS — D124 Benign neoplasm of descending colon: Secondary | ICD-10-CM | POA: Diagnosis present

## 2015-05-06 DIAGNOSIS — Z9049 Acquired absence of other specified parts of digestive tract: Secondary | ICD-10-CM | POA: Diagnosis present

## 2015-05-06 DIAGNOSIS — D175 Benign lipomatous neoplasm of intra-abdominal organs: Secondary | ICD-10-CM | POA: Diagnosis present

## 2015-05-06 DIAGNOSIS — E785 Hyperlipidemia, unspecified: Secondary | ICD-10-CM | POA: Diagnosis present

## 2015-05-06 DIAGNOSIS — Z87891 Personal history of nicotine dependence: Secondary | ICD-10-CM | POA: Diagnosis not present

## 2015-05-06 DIAGNOSIS — Z794 Long term (current) use of insulin: Secondary | ICD-10-CM | POA: Diagnosis not present

## 2015-05-06 DIAGNOSIS — D62 Acute posthemorrhagic anemia: Secondary | ICD-10-CM | POA: Diagnosis present

## 2015-05-06 DIAGNOSIS — Z79899 Other long term (current) drug therapy: Secondary | ICD-10-CM | POA: Diagnosis not present

## 2015-05-06 DIAGNOSIS — I5032 Chronic diastolic (congestive) heart failure: Secondary | ICD-10-CM | POA: Diagnosis present

## 2015-05-06 DIAGNOSIS — J441 Chronic obstructive pulmonary disease with (acute) exacerbation: Secondary | ICD-10-CM

## 2015-05-06 DIAGNOSIS — Z8249 Family history of ischemic heart disease and other diseases of the circulatory system: Secondary | ICD-10-CM | POA: Diagnosis not present

## 2015-05-06 DIAGNOSIS — R571 Hypovolemic shock: Secondary | ICD-10-CM | POA: Diagnosis present

## 2015-05-06 DIAGNOSIS — D649 Anemia, unspecified: Secondary | ICD-10-CM

## 2015-05-06 DIAGNOSIS — Q2733 Arteriovenous malformation of digestive system vessel: Secondary | ICD-10-CM | POA: Diagnosis not present

## 2015-05-06 DIAGNOSIS — Z7951 Long term (current) use of inhaled steroids: Secondary | ICD-10-CM | POA: Diagnosis not present

## 2015-05-06 DIAGNOSIS — G4700 Insomnia, unspecified: Secondary | ICD-10-CM | POA: Diagnosis present

## 2015-05-06 DIAGNOSIS — Z880 Allergy status to penicillin: Secondary | ICD-10-CM | POA: Diagnosis not present

## 2015-05-06 DIAGNOSIS — K5750 Diverticulosis of both small and large intestine without perforation or abscess without bleeding: Secondary | ICD-10-CM | POA: Diagnosis present

## 2015-05-06 DIAGNOSIS — K219 Gastro-esophageal reflux disease without esophagitis: Secondary | ICD-10-CM | POA: Diagnosis present

## 2015-05-06 DIAGNOSIS — Z9981 Dependence on supplemental oxygen: Secondary | ICD-10-CM | POA: Diagnosis not present

## 2015-05-06 DIAGNOSIS — E1165 Type 2 diabetes mellitus with hyperglycemia: Secondary | ICD-10-CM | POA: Diagnosis present

## 2015-05-06 DIAGNOSIS — Z91048 Other nonmedicinal substance allergy status: Secondary | ICD-10-CM | POA: Diagnosis not present

## 2015-05-06 DIAGNOSIS — Z951 Presence of aortocoronary bypass graft: Secondary | ICD-10-CM | POA: Diagnosis not present

## 2015-05-06 DIAGNOSIS — K5521 Angiodysplasia of colon with hemorrhage: Secondary | ICD-10-CM | POA: Diagnosis present

## 2015-05-06 DIAGNOSIS — D500 Iron deficiency anemia secondary to blood loss (chronic): Secondary | ICD-10-CM | POA: Diagnosis present

## 2015-05-06 DIAGNOSIS — Z452 Encounter for adjustment and management of vascular access device: Secondary | ICD-10-CM

## 2015-05-06 DIAGNOSIS — K2970 Gastritis, unspecified, without bleeding: Secondary | ICD-10-CM | POA: Diagnosis present

## 2015-05-06 DIAGNOSIS — Z833 Family history of diabetes mellitus: Secondary | ICD-10-CM | POA: Diagnosis not present

## 2015-05-06 DIAGNOSIS — Z9889 Other specified postprocedural states: Secondary | ICD-10-CM | POA: Diagnosis not present

## 2015-05-06 DIAGNOSIS — I10 Essential (primary) hypertension: Secondary | ICD-10-CM | POA: Diagnosis present

## 2015-05-06 DIAGNOSIS — I739 Peripheral vascular disease, unspecified: Secondary | ICD-10-CM | POA: Diagnosis present

## 2015-05-06 DIAGNOSIS — D120 Benign neoplasm of cecum: Secondary | ICD-10-CM | POA: Diagnosis present

## 2015-05-06 HISTORY — DX: Insomnia, unspecified: G47.00

## 2015-05-06 HISTORY — DX: Elevated white blood cell count, unspecified: D72.829

## 2015-05-06 HISTORY — DX: Vitamin D deficiency, unspecified: E55.9

## 2015-05-06 HISTORY — DX: Essential (primary) hypertension: I10

## 2015-05-06 HISTORY — DX: Bipolar disorder, current episode mixed, moderate: F31.62

## 2015-05-06 LAB — BRAIN NATRIURETIC PEPTIDE: B NATRIURETIC PEPTIDE 5: 181 pg/mL — AB (ref 0.0–100.0)

## 2015-05-06 LAB — CBC WITH DIFFERENTIAL/PLATELET
BASOS ABS: 0 10*3/uL (ref 0.0–0.1)
Band Neutrophils: 2 % (ref 0–10)
Basophils Relative: 0 % (ref 0–1)
Blasts: 0 %
Eosinophils Absolute: 0 10*3/uL (ref 0.0–0.7)
Eosinophils Relative: 0 % (ref 0–5)
HEMATOCRIT: 25.2 % — AB (ref 40.0–52.0)
Hemoglobin: 7.7 g/dL — ABNORMAL LOW (ref 13.0–18.0)
LYMPHS PCT: 14 % (ref 12–46)
Lymphs Abs: 2.1 10*3/uL (ref 0.7–4.0)
MCH: 26.5 pg (ref 26.0–34.0)
MCHC: 30.5 g/dL — ABNORMAL LOW (ref 32.0–36.0)
MCV: 86.9 fL (ref 80.0–100.0)
MONO ABS: 1.2 10*3/uL — AB (ref 0.1–1.0)
MONOS PCT: 8 % (ref 3–12)
Metamyelocytes Relative: 0 %
Myelocytes: 0 %
NEUTROS ABS: 11.5 10*3/uL — AB (ref 1.7–7.7)
NEUTROS PCT: 76 % (ref 43–77)
NRBC: 0 /100{WBCs}
PROMYELOCYTES ABS: 0 %
Platelets: 379 10*3/uL (ref 150–440)
RBC: 2.9 MIL/uL — AB (ref 4.40–5.90)
RDW: 16.4 % — ABNORMAL HIGH (ref 11.5–14.5)
WBC: 14.8 10*3/uL — ABNORMAL HIGH (ref 3.8–10.6)

## 2015-05-06 LAB — BASIC METABOLIC PANEL
ANION GAP: 7 (ref 5–15)
BUN: 34 mg/dL — ABNORMAL HIGH (ref 6–20)
CALCIUM: 8.3 mg/dL — AB (ref 8.9–10.3)
CO2: 29 mmol/L (ref 22–32)
CREATININE: 1.21 mg/dL (ref 0.61–1.24)
Chloride: 102 mmol/L (ref 101–111)
GFR calc Af Amer: >60 mL/min (ref >60)
GFR calc non Af Amer: 58 mL/min — ABNORMAL LOW (ref >60)
Glucose, Bld: 388 mg/dL — ABNORMAL HIGH (ref 65–99)
Potassium: 5 mmol/L (ref 3.5–5.1)
SODIUM: 138 mmol/L (ref 135–145)

## 2015-05-06 LAB — CBC
HEMATOCRIT: 27.7 % — AB (ref 40.0–52.0)
HEMOGLOBIN: 8.6 g/dL — AB (ref 13.0–18.0)
MCH: 26.4 pg (ref 26.0–34.0)
MCHC: 31 g/dL — ABNORMAL LOW (ref 32.0–36.0)
MCV: 85.4 fL (ref 80.0–100.0)
Platelets: 272 10*3/uL (ref 150–440)
RBC: 3.24 MIL/uL — AB (ref 4.40–5.90)
RDW: 16.5 % — ABNORMAL HIGH (ref 11.5–14.5)
WBC: 14 10*3/uL — AB (ref 3.8–10.6)

## 2015-05-06 LAB — GLUCOSE, CAPILLARY: GLUCOSE-CAPILLARY: 388 mg/dL — AB (ref 65–99)

## 2015-05-06 LAB — TROPONIN I

## 2015-05-06 LAB — PREPARE RBC (CROSSMATCH)

## 2015-05-06 LAB — MRSA PCR SCREENING: MRSA BY PCR: NEGATIVE

## 2015-05-06 MED ORDER — ALPRAZOLAM 0.5 MG PO TABS
1.0000 mg | ORAL_TABLET | Freq: Once | ORAL | Status: AC
  Administered 2015-05-06: 1 mg via ORAL

## 2015-05-06 MED ORDER — SODIUM CHLORIDE 0.9 % IV SOLN: 10.0000 mL/h | Freq: Once | INTRAVENOUS | Status: DC

## 2015-05-06 MED ORDER — SODIUM CHLORIDE 0.9 % IV SOLN: INTRAVENOUS | Status: DC

## 2015-05-06 MED ORDER — ALPRAZOLAM 0.5 MG PO TABS
ORAL_TABLET | ORAL | Status: AC
  Administered 2015-05-06: 1 mg via ORAL
  Filled 2015-05-06: qty 2

## 2015-05-06 MED ORDER — ALPRAZOLAM 0.5 MG PO TABS
1.0000 mg | ORAL_TABLET | Freq: Once | ORAL | Status: AC
  Administered 2015-05-06: 1 mg via ORAL
  Filled 2015-05-06: qty 2

## 2015-05-06 MED ORDER — IPRATROPIUM-ALBUTEROL 0.5-2.5 (3) MG/3ML IN SOLN
3.0000 mL | Freq: Four times a day (QID) | RESPIRATORY_TRACT | Status: DC
  Administered 2015-05-06 – 2015-05-11 (×18): 3 mL via RESPIRATORY_TRACT
  Filled 2015-05-06 (×18): qty 3

## 2015-05-06 MED ORDER — METHYLPREDNISOLONE SODIUM SUCC 125 MG IJ SOLR
125.0000 mg | Freq: Once | INTRAMUSCULAR | Status: AC
  Administered 2015-05-06: 125 mg via INTRAVENOUS
  Filled 2015-05-06: qty 2

## 2015-05-06 MED ORDER — ALPRAZOLAM 0.5 MG PO TABS
0.5000 mg | ORAL_TABLET | Freq: Three times a day (TID) | ORAL | Status: DC | PRN
  Administered 2015-05-07 – 2015-05-13 (×11): 0.5 mg via ORAL
  Filled 2015-05-06 (×12): qty 1

## 2015-05-06 MED ORDER — BUDESONIDE-FORMOTEROL FUMARATE 160-4.5 MCG/ACT IN AERO
2.0000 | INHALATION_SPRAY | Freq: Two times a day (BID) | RESPIRATORY_TRACT | Status: DC
  Administered 2015-05-06 – 2015-05-13 (×14): 2 via RESPIRATORY_TRACT
  Filled 2015-05-06 (×2): qty 6

## 2015-05-06 MED ORDER — SODIUM CHLORIDE 0.9 % IV SOLN
10.0000 mL/h | Freq: Once | INTRAVENOUS | Status: AC
  Administered 2015-05-07: 15:00:00 via INTRAVENOUS

## 2015-05-06 MED ORDER — IPRATROPIUM-ALBUTEROL 0.5-2.5 (3) MG/3ML IN SOLN
3.0000 mL | Freq: Once | RESPIRATORY_TRACT | Status: AC
  Administered 2015-05-06: 3 mL via RESPIRATORY_TRACT
  Filled 2015-05-06: qty 3

## 2015-05-06 MED ORDER — METHYLPREDNISOLONE SODIUM SUCC 125 MG IJ SOLR
60.0000 mg | INTRAMUSCULAR | Status: DC
  Administered 2015-05-06 – 2015-05-07 (×2): 60 mg via INTRAVENOUS
  Filled 2015-05-06 (×2): qty 2

## 2015-05-06 NOTE — ED Notes (Signed)
New order for cbc, pt is receiving blood products at this time, will draw after completion

## 2015-05-06 NOTE — ED Notes (Signed)
BIB [facility] from home d/t SOB, difficulty breathing, anxiety.

## 2015-05-06 NOTE — ED Provider Notes (Addendum)
[facility] Emergency Department Provider Note     Time seen: ----------------------------------------- 1:37 PM on 05/06/2015 -----------------------------------------    I have reviewed the triage vital signs and the nursing notes.   HISTORY  Chief Complaint Shortness of Breath    HPI [name] is a 74 y.o. male who presents to the ER with shortness of breath and worsening dyspnea on exertion. Patient states he was using the bathroom and got up to urinate. Patient states at that point began having severe shortness of breath. Patient states in the past Xanax has helped him for this, but he has run out due to changing doctors. He denies any fevers or chills, just complains of severe shortness of breath. In route patient was given DuoNeb with some improvement. The wheezing significantly in route.   Past Medical History  Diagnosis Date  . Allergic rhinitis, cause unspecified   . Other and unspecified hyperlipidemia   . 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 Unspecified essential hypertension   . 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 Severe bullous disease of LUL. Prolonged intubation after surgeries due to COPD  . 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 Anemia     Recurrent microcytic, presumably GI   . Complication of anesthesia     trouble getting off ventilator  . Memory loss   . Diabetes   . CHF (congestive heart failure)   . CAD (coronary artery disease)   . GI bleed requiring more than 4 units of blood in 24 hours, ICU, or surgery     Hx bleeding gastric polyps, cecal & sigmoid AVMS s/p APC 03/30/14  . AVM (arteriovenous malformation) of colon with hemorrhage   . Multiple gastric polyps   . Diverticulosis     Patient Active Problem List   Diagnosis Date Noted  .  Chest pain 05/03/2015  . SOB (shortness of breath) 05/03/2015  . Gastric AVM   . AVM (arteriovenous malformation) of duodenum, acquired with hemorrhage   . Acute blood loss anemia 04/22/2015  . Guaiac positive stools   . Bipolar I disorder, most recent episode mixed 04/17/2015  . Bipolar 1 disorder, mixed, moderate 04/16/2015  . Major depressive disorder, recurrent, severe without psychotic features   . Severe major depression without psychotic features 04/14/2015  . COPD exacerbation 04/10/2015  . GI bleed 03/09/2015  . Supplemental oxygen dependent 11/30/2014  . Iron deficiency anemia due to chronic blood loss 11/25/2014  . Vitamin D deficiency 08/10/2014  . Insomnia 08/10/2014  . Polypharmacy 08/10/2014  . Chronic pain syndrome 08/10/2014  . GI bleed requiring more than 4 units of blood in 24 hours, ICU, or surgery   . 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 Abdominal pain 12/04/2013  . Leucocytosis 12/04/2013  . Aftercare following surgery of the circulatory system, [place] 11/04/2013  . Multiple gastric polyps 09/10/2013  . Anxiety state 09/10/2013  . 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 Chronic hypoxemic respiratory failure 05/21/2013  . Depression 01/14/2013  . Fatigue 11/06/2012  . Chronic GI bleeding 05/17/2012  . CAD (coronary artery disease) 04/17/2012  . Diastolic HF (heart failure) 03/27/2012  . Anemia   . COPD (chronic obstructive pulmonary disease) 09/13/2011  . Diabetes mellitus type II, controlled 11/11/2010  . CERUMEN IMPACTION, BILATERAL  11/11/2010  . HYPERLIPIDEMIA 08/18/2009  . Essential hypertension 08/18/2009  . MYOCARDIAL INFARCTION 08/18/2009  . ALLERGIC RHINITIS 08/18/2009  . AAA (abdominal aortic aneurysm) 12/15/2008    Past Surgical History  Procedure Laterality Date  . Coronary artery bypass graft    . Tonsillectomy    . Elbow surgery    . Appendectomy    . Wrist surgery      For  knife wound   . Stents in femoral artery    . Esophagogastroduodenoscopy  03/27/2012    Procedure: ESOPHAGOGASTRODUODENOSCOPY (EGD);  Surgeon: [name], MD;  Location: [facility];  Service: Endoscopy;  Laterality: N/A;  . Esophagogastroduodenoscopy  04/07/2012    Procedure: ESOPHAGOGASTRODUODENOSCOPY (EGD);  Surgeon: Juanita Craver, MD;  Location: [facility];  Service: Endoscopy;  Laterality: N/A;  Rm 1410  . Givens capsule study  04/10/2012    Procedure: GIVENS CAPSULE STUDY;  Surgeon: Juanita Craver, MD;  Location: [facility];  Service: Endoscopy;  Laterality: N/A;  . Colonoscopy  04/13/2012    Procedure: COLONOSCOPY;  Surgeon: [name], MD;  Location: [facility];  Service: Endoscopy;  Laterality: N/A;  . Esophagogastroduodenoscopy  04/13/2012    Procedure: ESOPHAGOGASTRODUODENOSCOPY (EGD);  Surgeon: [name], MD;  Location: [facility];  Service: Endoscopy;  Laterality: N/A;  . Givens capsule study  05/19/2012    Procedure: GIVENS CAPSULE STUDY;  Surgeon: [name], MD;  Location: [facility];  Service: Endoscopy;  Laterality: N/A;  . Esophagogastroduodenoscopy N/A 12/06/2012    Procedure: ESOPHAGOGASTRODUODENOSCOPY (EGD);  Surgeon: [name], MD;  Location: [facility];  Service: Endoscopy;  Laterality: N/A;  . Esophagogastroduodenoscopy N/A 08/21/2013    Procedure: ESOPHAGOGASTRODUODENOSCOPY (EGD);  Surgeon: [name], MD;  Location: [facility];  Service: Endoscopy;  Laterality: N/A;  . Esophagogastroduodenoscopy N/A 09/09/2013    Procedure: ESOPHAGOGASTRODUODENOSCOPY (EGD);  Surgeon: [name], MD;  Location: [facility];  Service: Endoscopy;  Laterality: N/A;  . Esophagogastroduodenoscopy N/A 09/27/2013    Hung-snare polypectomy of multiple bleeding gastric polyp s/p APC  . Hot hemostasis N/A 09/27/2013    Procedure: HOT HEMOSTASIS (ARGON PLASMA COAGULATION/BICAP);  Surgeon: [name], MD;  Location: [facility];  Service: Endoscopy;   Laterality: N/A;  . Givens capsule study N/A 12/04/2013    Procedure: GIVENS CAPSULE STUDY;  Surgeon: [name], MD;  Location: [facility];  Service: Endoscopy;  Laterality: N/A;  . Colonoscopy N/A 12/07/2013    Kaplan-sigmoid/cecal AVMS, sigoid diverticulosis  . Colonoscopy N/A 03/20/2014    Hung-cecal AVMs s/p APC  . Esophagogastroduodenoscopy (egd) with propofol N/A 04/22/2015    Procedure: ESOPHAGOGASTRODUODENOSCOPY (EGD) WITH PROPOFOL;  Surgeon: [name], MD;  Location: [facility] END[facility];  Service: Endoscopy;  Laterality: N/A;    Allergies Penicillins; Demerol; and Dilaudid  Social History History  Substance Use Topics  . Smoking status: Former Smoker -- 2.00 packs/day for 50 years    Types: Cigarettes    Quit date: 11/18/2009  . Smokeless tobacco: Never Used  . Alcohol Use: No     Comment: quit 7 years ago    Review of Systems Constitutional: Negative for fever. Eyes: Negative for visual changes. ENT: Negative for sore throat. Cardiovascular: Negative for chest pain. Respiratory: Positive shortness of breath Gastrointestinal: Negative for abdominal pain, vomiting and diarrhea. Genitourinary: Negative for dysuria. Musculoskeletal: Negative for back pain. Skin: Negative for rash. Neurological: Negative for headaches, focal weakness or numbness.  10-point ROS otherwise negative.  ____________________________________________   PHYSICAL EXAM:  VITAL SIGNS: ED Triage  Vitals  Enc Vitals Group     BP 05/06/15 1328 128/77 mmHg     Pulse Rate 05/06/15 1328 125     Resp --      Temp 05/06/15 1334 98.5 F (36.9 C)     Temp Source 05/06/15 1334 Oral     SpO2 05/06/15 1328 100 %     Weight 05/06/15 1328 262 lb (118.842 kg)     Height 05/06/15 1328 6\' 2"  (1.88 m)     Head Cir --      Peak Flow --      Pain Score --      Pain Loc --      Pain Edu? --      Excl. in [place]? --     Constitutional: Alert and oriented. Mild distress, anxious Eyes: Conjunctivae are  normal. PERRL. Normal extraocular movements. ENT   Head: Normocephalic and atraumatic.   Nose: No congestion/rhinnorhea.   Mouth/Throat: Mucous membranes are moist.   Neck: No stridor. Cardiovascular: Normal rate, regular rhythm. Normal and symmetric distal pulses are present in all extremities. No murmurs, rubs, or gallops. Respiratory: Wheezing bilaterally with diminished breath sounds. Gastrointestinal: Soft and nontender. No distention. No abdominal bruits. There is no CVA tenderness. Musculoskeletal: Nontender with normal range of motion in all extremities. No joint effusions.  No lower extremity tenderness, mild edema Neurologic:  Normal speech and language. No gross focal neurologic deficits are appreciated. Speech is normal. No gait instability. Skin:  Skin is warm, dry and intact. No rash noted. Psychiatric: Mood and affect are normal. Patient is anxious ____________________________________________  EKG: Interpreted by me. Sinus tachycardia with rate of 131 bpm, leftward axis, low voltage QRS, incomplete right bundle branch block.  ____________________________________________  ED COURSE:  Pertinent labs & imaging results that were available during my care of the patient were reviewed by me and considered in my medical decision making (see chart for details). Patient received DuoNeb, steroids and Xanax. We'll reevaluate ____________________________________________    LABS (pertinent positives/negatives)  Labs Reviewed  CBC WITH DIFFERENTIAL/PLATELET - Abnormal; Notable for the following:    WBC 14.8 (*)    RBC 2.90 (*)    Hemoglobin 7.7 (*)    HCT 25.2 (*)    MCHC 30.5 (*)    RDW 16.4 (*)    Neutro Abs 11.5 (*)    Monocytes Absolute 1.2 (*)    All other components within normal limits  BASIC METABOLIC PANEL - Abnormal; Notable for the following:    Glucose, Bld 388 (*)    BUN 34 (*)    Calcium 8.3 (*)    GFR calc non Af Amer 58 (*)    All other  components within normal limits  BRAIN NATRIURETIC PEPTIDE - Abnormal; Notable for the following:    B Natriuretic Peptide 181.0 (*)    All other components within normal limits  TROPONIN I    RADIOLOGY  Chest x-ray IMPRESSION: Underlying bullous emphysematous change. No edema or consolidation. Stable cardiac prominence. Sizable hiatal type hernia. ____________________________________________   CRITICAL CARE Performed by: Earleen Newport   Total critical care time: 30 minutes  Critical care time was exclusive of separately billable procedures and treating other patients.  Critical care was necessary to treat or prevent imminent or life-threatening deterioration.  Critical care was time spent personally by me on the following activities: development of treatment plan with patient and/or surrogate as well as nursing, discussions with consultants, evaluation of patient's response to treatment, examination  of patient, obtaining history from patient or surrogate, ordering and performing treatments and interventions, ordering and review of laboratory studies, ordering and review of radiographic studies, pulse oximetry and re-evaluation of patient's condition.   FINAL ASSESSMENT AND PLAN  Dyspnea, COPD exacerbation, anemia, acute GI bleeding  Plan: Patient with labs and imaging as dictated above. Patient more relaxed after 2 doses of Xanax here, received DuoNeb since steroids IV. Patient still remains very dyspneic, constantly complaining of not being able to breathe. We have increased his oxygen to 4 L/m. Patient will require readmission and likely blood transfusion.   Earleen Newport, MD Patient dropped his blood pressure after having black and bloody bowel movement here. He likely has ongoing GI bleed, emergency release blood is being given O+. Patient's vital signs are improving with transfusion.  Earleen Newport, MD 05/06/15 [place],  MD 05/06/15 [phone]

## 2015-05-06 NOTE — ED Notes (Signed)
Report given to Kelly RN 
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
## 2015-05-06 NOTE — ED Notes (Signed)
Call to CXR, pt available for imaging.

## 2015-05-06 NOTE — ED Notes (Signed)
Assisted pt to bedside commode, pt had small soft black stool and incontinent of urine. Pt gown and linens changed. Assisted pt back to bed. Noted pt bp to be low, EDP and admitting physician aware of the same, new order for 1 unit emergency release blood.

## 2015-05-06 NOTE — Telephone Encounter (Signed)
Spoke with spouse. She is wanting Dr. Lake Bells to refill pt xanax 0.5 mg tabs. Pt has not seen his new PCP yet and does not see them for a few weeks.  She reports pt is having panic attacks and it at times if affect his breathing. Please advise Dr. Lake Bells thanks

## 2015-05-07 ENCOUNTER — Inpatient Hospital Stay: Payer: Commercial Managed Care - HMO | Admitting: Anesthesiology

## 2015-05-07 ENCOUNTER — Encounter: Payer: Self-pay | Admitting: Gastroenterology

## 2015-05-07 ENCOUNTER — Encounter
Admission: EM | Disposition: A | Discharge status: Home / Self Care | Payer: Self-pay | Source: Home / Self Care | Attending: Internal Medicine

## 2015-05-07 HISTORY — PX: ESOPHAGOGASTRODUODENOSCOPY: SHX5428

## 2015-05-07 LAB — CBC
HEMATOCRIT: 24.1 % — AB (ref 40.0–52.0)
HEMOGLOBIN: 7.6 g/dL — AB (ref 13.0–18.0)
MCH: 27 pg (ref 26.0–34.0)
MCHC: 31.7 g/dL — ABNORMAL LOW (ref 32.0–36.0)
MCV: 85.3 fL (ref 80.0–100.0)
Platelets: 297 10*3/uL (ref 150–440)
RBC: 2.82 MIL/uL — ABNORMAL LOW (ref 4.40–5.90)
RDW: 17 % — ABNORMAL HIGH (ref 11.5–14.5)
WBC: 11 10*3/uL — ABNORMAL HIGH (ref 3.8–10.6)

## 2015-05-07 LAB — BASIC METABOLIC PANEL
ANION GAP: 9 (ref 5–15)
BUN: 43 mg/dL — ABNORMAL HIGH (ref 6–20)
CO2: 25 mmol/L (ref 22–32)
Calcium: 8.1 mg/dL — ABNORMAL LOW (ref 8.9–10.3)
Chloride: 100 mmol/L — ABNORMAL LOW (ref 101–111)
Creatinine, Ser: 1.25 mg/dL — ABNORMAL HIGH (ref 0.61–1.24)
GFR, EST NON AFRICAN AMERICAN: 55 mL/min — AB (ref >60)
Glucose, Bld: 476 mg/dL — ABNORMAL HIGH (ref 65–99)
POTASSIUM: 4.9 mmol/L (ref 3.5–5.1)
SODIUM: 134 mmol/L — AB (ref 135–145)

## 2015-05-07 LAB — GLUCOSE, CAPILLARY
GLUCOSE-CAPILLARY: 434 mg/dL — AB (ref 65–99)
Glucose-Capillary: 440 mg/dL — ABNORMAL HIGH (ref 65–99)
Glucose-Capillary: 377 mg/dL — ABNORMAL HIGH (ref 65–99)

## 2015-05-07 LAB — PREPARE RBC (CROSSMATCH)

## 2015-05-07 SURGERY — EGD (ESOPHAGOGASTRODUODENOSCOPY)
Anesthesia: General

## 2015-05-07 MED ORDER — INSULIN ASPART 100 UNIT/ML ~~LOC~~ SOLN
20.0000 [IU] | Freq: Once | SUBCUTANEOUS | Status: AC
  Administered 2015-05-07: 20 [IU] via SUBCUTANEOUS
  Filled 2015-05-07: qty 20

## 2015-05-07 MED ORDER — INSULIN REGULAR HUMAN 100 UNIT/ML IJ SOLN
20.0000 [IU] | Freq: Once | INTRAMUSCULAR | Status: DC
  Filled 2015-05-07: qty 0.2

## 2015-05-07 MED ORDER — PANTOPRAZOLE SODIUM 40 MG IV SOLR
40.0000 mg | Freq: Two times a day (BID) | INTRAVENOUS | Status: DC
  Administered 2015-05-08 – 2015-05-11 (×6): 40 mg via INTRAVENOUS
  Filled 2015-05-07 (×6): qty 40

## 2015-05-07 MED ORDER — ZOLPIDEM TARTRATE 5 MG PO TABS
5.0000 mg | ORAL_TABLET | Freq: Every evening | ORAL | Status: DC | PRN
  Administered 2015-05-07 – 2015-05-10 (×4): 5 mg via ORAL
  Filled 2015-05-07 (×4): qty 1

## 2015-05-07 MED ORDER — INSULIN ASPART 100 UNIT/ML ~~LOC~~ SOLN
0.0000 [IU] | Freq: Three times a day (TID) | SUBCUTANEOUS | Status: DC
  Administered 2015-05-07 – 2015-05-08 (×3): 20 [IU] via SUBCUTANEOUS
  Administered 2015-05-09: 11 [IU] via SUBCUTANEOUS
  Administered 2015-05-09: 10 [IU] via SUBCUTANEOUS
  Administered 2015-05-09 (×2): 7 [IU] via SUBCUTANEOUS
  Administered 2015-05-10: 20 [IU] via SUBCUTANEOUS
  Administered 2015-05-10: 7 [IU] via SUBCUTANEOUS
  Administered 2015-05-10 – 2015-05-11 (×2): 11 [IU] via SUBCUTANEOUS
  Filled 2015-05-07: qty 15
  Filled 2015-05-07: qty 20
  Filled 2015-05-07: qty 11
  Filled 2015-05-07: qty 20
  Filled 2015-05-07: qty 7
  Filled 2015-05-07 (×2): qty 20
  Filled 2015-05-07 (×2): qty 11
  Filled 2015-05-07: qty 20
  Filled 2015-05-07: qty 7

## 2015-05-07 MED ORDER — ALBUTEROL SULFATE HFA 108 (90 BASE) MCG/ACT IN AERS: 2.0000 | INHALATION_SPRAY | Freq: Four times a day (QID) | RESPIRATORY_TRACT | Status: DC | PRN

## 2015-05-07 MED ORDER — INSULIN REGULAR HUMAN 100 UNIT/ML IJ SOLN
20.0000 [IU] | Freq: Once | INTRAMUSCULAR | Status: AC
  Administered 2015-05-07: 20 [IU] via INTRAVENOUS
  Filled 2015-05-07 (×2): qty 0.2

## 2015-05-07 MED ORDER — SODIUM CHLORIDE 0.9 % IV SOLN: INTRAVENOUS | Status: DC

## 2015-05-07 MED ORDER — VITAMIN D (ERGOCALCIFEROL) 1.25 MG (50000 UNIT) PO CAPS
50000.0000 [IU] | ORAL_CAPSULE | ORAL | Status: DC
  Filled 2015-05-07: qty 1

## 2015-05-07 MED ORDER — ACETAMINOPHEN 325 MG PO TABS
650.0000 mg | ORAL_TABLET | Freq: Four times a day (QID) | ORAL | Status: DC | PRN
  Administered 2015-05-07 – 2015-05-13 (×6): 650 mg via ORAL
  Filled 2015-05-07 (×6): qty 2

## 2015-05-07 MED ORDER — TRAZODONE HCL 50 MG PO TABS
50.0000 mg | ORAL_TABLET | Freq: Three times a day (TID) | ORAL | Status: DC
  Administered 2015-05-07 – 2015-05-11 (×13): 50 mg via ORAL
  Filled 2015-05-07 (×13): qty 1

## 2015-05-07 MED ORDER — LIDOCAINE HCL (CARDIAC) 20 MG/ML IV SOLN
INTRAVENOUS | Status: DC | PRN
Start: 2015-05-07 — End: 2015-05-07
  Administered 2015-05-07: 30 mg via INTRAVENOUS

## 2015-05-07 MED ORDER — SODIUM CHLORIDE 0.9 % IV SOLN
Freq: Once | INTRAVENOUS | Status: AC
  Administered 2015-05-07: 1000 mL via INTRAVENOUS

## 2015-05-07 MED ORDER — INSULIN GLARGINE 100 UNIT/ML ~~LOC~~ SOLN
15.0000 [IU] | Freq: Every day | SUBCUTANEOUS | Status: DC
  Administered 2015-05-08: 15 [IU] via SUBCUTANEOUS
  Filled 2015-05-07 (×2): qty 0.15

## 2015-05-07 MED ORDER — CALCIUM CARBONATE ANTACID 500 MG PO CHEW: 1.0000 | CHEWABLE_TABLET | ORAL | Status: DC | PRN

## 2015-05-07 MED ORDER — PROPOFOL 10 MG/ML IV BOLUS
INTRAVENOUS | Status: DC | PRN
  Administered 2015-05-07: 50 mg via INTRAVENOUS

## 2015-05-07 MED ORDER — SODIUM CHLORIDE 0.9 % IV SOLN
INTRAVENOUS | Status: DC
  Administered 2015-05-08: 23:00:00 via INTRAVENOUS

## 2015-05-07 MED ORDER — CA CARBONATE-MAG HYDROXIDE 675-135 MG PO CHEW: CHEWABLE_TABLET | ORAL | Status: DC | PRN

## 2015-05-07 MED ORDER — ALBUTEROL SULFATE (2.5 MG/3ML) 0.083% IN NEBU
2.5000 mg | INHALATION_SOLUTION | Freq: Four times a day (QID) | RESPIRATORY_TRACT | Status: DC | PRN
  Administered 2015-05-12: 2.5 mg via RESPIRATORY_TRACT
  Filled 2015-05-07: qty 3

## 2015-05-07 MED ORDER — PROPOFOL INFUSION 10 MG/ML OPTIME
INTRAVENOUS | Status: DC | PRN
  Administered 2015-05-07: 160 ug/kg/min via INTRAVENOUS

## 2015-05-07 MED ORDER — FENTANYL CITRATE (PF) 100 MCG/2ML IJ SOLN
INTRAMUSCULAR | Status: DC | PRN
  Administered 2015-05-07: 50 ug via INTRAVENOUS

## 2015-05-07 MED ORDER — ATORVASTATIN CALCIUM 20 MG PO TABS
40.0000 mg | ORAL_TABLET | Freq: Every day | ORAL | Status: DC
  Administered 2015-05-07 – 2015-05-12 (×6): 40 mg via ORAL
  Filled 2015-05-07 (×6): qty 2

## 2015-05-07 MED ORDER — TIOTROPIUM BROMIDE MONOHYDRATE 18 MCG IN CAPS
18.0000 ug | ORAL_CAPSULE | Freq: Every day | RESPIRATORY_TRACT | Status: DC
  Administered 2015-05-07 – 2015-05-11 (×5): 18 ug via RESPIRATORY_TRACT
  Filled 2015-05-07: qty 5

## 2015-05-07 NOTE — Op Note (Signed)
EGD showed multiple gastric polyps with superficial ulcerations. These were not actively bleeding but could have bled yesterday. Did not see bleeding AVM's in the UGI tract. Will start clear liquid diet. Recommend PPI BID. O2 sat dropped with minimal sedation. Therefore, not stable for colonoscopy at this time. Will follow. thanks

## 2015-05-07 NOTE — Progress Notes (Signed)
RN notified MD Dustin Flock that patient's fsbs even after 20 units subq given around 10am is now 440. Patient is to go to EGD in the afternoon and is NPO.  MD states give 20 unit insulin IV once.  RN ask MD if patient needs to stay in ICU, MD states "no, you can go ahead and transfer him"  "I will restart his lantus after he gets back from the EGD"

## 2015-05-07 NOTE — Progress Notes (Signed)
  Inpatient Diabetes Program Recommendations  AACE/ADA: New Consensus Statement on Inpatient Glycemic Control (2013)  Target Ranges:  Prepandial:   less than 140 mg/dL      Peak postprandial:   less than 180 mg/dL (1-2 hours)      Critically ill patients:  140 - 180 mg/dL     Spoke to RN receiving patient into room 211 post procedure today- patient has not arrive.  Discussed recommendation for insulin which is documented by me in the chart today. She was aware that the patient has elevated blood sugars and will address elevated blood sugars with the MD again when the patient arrives to the unit.    Gentry Fitz, RN, BA, MHA, CDE Diabetes Coordinator Inpatient Diabetes Program  [phone] (Team Pager) [phone] ([place]) 05/07/2015 3:28 PM

## 2015-05-07 NOTE — Progress Notes (Signed)
Dr Posey Pronto at bedside, RN notified MD that patient is NPO but requesting PRN xanax, MD states okay to give with a sip of water.  MD aware that patient is to go for scope in the afternoon, MD ordered another unit of blood to be given after scope.

## 2015-05-07 NOTE — Progress Notes (Signed)
Dr. Ree Kida of blood sugar of 371, Dr. Posey Pronto ordered 15 units of Lantus to be given daily and to give 20 units of NovoLog per sliding scale criteria.
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
## 2015-05-07 NOTE — Anesthesia Preprocedure Evaluation (Signed)
Anesthesia Evaluation   Patient awake    Reviewed: Allergy & Precautions  Airway Mallampati: III       Dental  (+) Edentulous Lower, Edentulous Upper   Pulmonary shortness of breath, COPD oxygen dependent, former smoker,    + decreased breath sounds      Cardiovascular hypertension, Pt. on medications and Pt. on home beta blockers + CAD, + Past MI, + Peripheral Vascular Disease and +CHF Rhythm:Irregular     Neuro/Psych Anxiety Depression Bipolar Disorder    GI/Hepatic Neg liver ROS, GERD-  ,  Endo/Other  diabetes, Type 1, Insulin DependentMorbid obesity  Renal/GU      Musculoskeletal negative musculoskeletal ROS (+)   Abdominal (+) + obese,   Peds  Hematology  (+) anemia ,   Anesthesia Other Findings   Reproductive/Obstetrics negative OB ROS                             Anesthesia Physical Anesthesia Plan  ASA: IV and emergent  Anesthesia Plan: General   Post-op Pain Management:    Induction: Intravenous  Airway Management Planned: Nasal Cannula  Additional Equipment:   Intra-op Plan:   Post-operative Plan:   Informed Consent: I have reviewed the patients History and Physical, chart, labs and discussed the procedure including the risks, benefits and alternatives for the proposed anesthesia with the patient or authorized representative who has indicated his/her understanding and acceptance.     Plan Discussed with: CRNA  Anesthesia Plan Comments:         Anesthesia Quick Evaluation

## 2015-05-07 NOTE — Anesthesia Postprocedure Evaluation (Signed)
  Anesthesia Post-op Note  Patient: [name]  Procedure(s) Performed: Procedure(s): ESOPHAGOGASTRODUODENOSCOPY (EGD) (N/A)  Anesthesia type:General  Patient location: PACU  Post pain: Pain level controlled  Post assessment: Post-op Vital signs reviewed, Patient's Cardiovascular Status Stable, Respiratory Function Stable, Patent Airway and No signs of Nausea or vomiting  Post vital signs: Reviewed and stable  Last Vitals:  Filed Vitals:   05/07/15 1504  BP: 93/45  Pulse: 106  Temp: 36.2 C  Resp: 20    Level of consciousness: awake, alert  and patient cooperative  Complications: No apparent anesthesia complications

## 2015-05-07 NOTE — Progress Notes (Signed)
Inpatient Diabetes Program Recommendations  AACE/ADA: New Consensus Statement on Inpatient Glycemic Control (2013)  Target Ranges:  Prepandial:   less than 140 mg/dL      Peak postprandial:   less than 180 mg/dL (1-2 hours)      Critically ill patients:  140 - 180 mg/dL   Results for JIYAAN, STEINHAUSER (MRN [id number]) as of 05/07/2015 08:40  Ref. Range 05/04/2015 10:49 05/06/2015 18:48  Glucose-Capillary Latest Ref Range: 65-99 mg/dL 226 (H) 388 (H)   Reason for assessment: elevated CBG  This list of diabetes medications was given to the patient when he discharged; Med reconciliation states he was not taking them because he never filled the prescriptions.   insulin aspart (NOVOLOG) 100 UNIT/ML injection Inject 0-15 Units into the skin 4 (four) times daily - before meals and at bedtime. Qty: 10 mL, Refills: 11  insulin aspart (NOVOLOG) 100 UNIT/ML injection Inject 10 Units into the skin 3 (three) times daily with meals. Qty: 10 mL, Refills: 11    insulin glargine (LANTUS) 100 UNIT/ML injection Inject 0.17 mLs (17 Units total) into the skin daily. Qty: 10 mL, Refills: 11    metFORMIN (GLUCOPHAGE) 500 MG tablet Take 1 tablet (500 mg total) by mouth daily. Qty: 30 tablet, Refills: 1         Consider starting the patient on Lantus 12 units QHS, Novolog 0-20 units q4h while he's NPO  Lab glucose this am 476mg /dl- spoke to RN to obtain fingerstck and call MD with results to obtain an order for stat correction Novolog insulin.   Post scope/procedure today:  Begin Novolog 5 units TID with meals for insulin meal coverage when the patient is no longer NPO Switch to Novolog 0-20 units tid when he's eating.    Gentry Fitz, RN, BA, MHA, CDE Diabetes Coordinator Inpatient Diabetes Program  [phone] (Team Pager) [phone] ([place]) 05/07/2015 9:01 AM   I

## 2015-05-07 NOTE — Transfer of Care (Signed)
Immediate Anesthesia Transfer of Care Note  Patient: [name]  Procedure(s) Performed: Procedure(s): ESOPHAGOGASTRODUODENOSCOPY (EGD) (N/A)  Patient Location: PACU and Short Stay  Anesthesia Type:General  Level of Consciousness: awake, oriented and patient cooperative  Airway & Oxygen Therapy: Patient Spontanous Breathing and Patient connected to nasal cannula oxygen  Post-op Assessment: Report given to RN  Post vital signs: stable  Last Vitals:  Filed Vitals:   05/07/15 1504  BP: 93/45  Pulse: 106  Temp: 36.2 C  Resp: 20    Complications: No apparent anesthesia complications

## 2015-05-07 NOTE — Progress Notes (Signed)
Patient assigned room 211.  Report given to receiving RN, Noreene Larsson with no further questions.

## 2015-05-07 NOTE — Progress Notes (Signed)
Paged Hospitalist and he was made aware of IV not working, pt complaining of pain when flushed and when solumedrol was pushed. Hospitalist was made aware of hgb of 7.8 and he gave orders to wait until tomorrow and let Dr. Posey Pronto decide whether to insert PICC or not.

## 2015-05-07 NOTE — Progress Notes (Signed)
RN notified Dr [name] that patient is NPO and wants a diet.  MD states that patient needs to remains NPO as patient will be scope later today.  RN notified MD patient got a unit of pRBCs in ED prior to admission last night but did not get the additional unit of pRBCs hgb went to 8.6 to 7.6 this am.  RN also notified MD that patient is CHF, RN ask if MD wants to give that additional unit.  MD states "hold that unit of blood"

## 2015-05-07 NOTE — Progress Notes (Signed)
RN Notified Dr [name] for patient ordered for NS at 179ml/hr but patient has CHF, MD states just "cut if back down to [facility]"  RN notified Dr Dustin Flock for patient's fsbs 434, patient is NPO but getting solumedrol.  Dr Posey Pronto states "okay, i'll look at it and put in some order"

## 2015-05-07 NOTE — Op Note (Signed)
[facility] Gastroenterology Patient Name: [name] Procedure Date: 05/07/2015 2:30 PM MRN: [id number] Account #: [id number] Date of Birth: August 20, 1941 Admit Type: Inpatient Age: 74 Room: [facility] ENDO ROOM 4 Gender: Male Note Status: Finalized Procedure:         Upper GI endoscopy Indications:       Iron deficiency anemia secondary to chronic blood loss,                     Melena, Known hx of AVM's throughout GI tract Providers:         Lupita Dawn. [name], MD Medicines:         Monitored Anesthesia Care Complications:     No immediate complications. Procedure:         Pre-Anesthesia Assessment:                    - Prior to the procedure, a History and Physical was                     performed, and patient medications, allergies and                     sensitivities were reviewed. The patient's tolerance of                     previous anesthesia was reviewed.                    - The risks and benefits of the procedure and the sedation                     options and risks were discussed with the patient. All                     questions were answered and informed consent was obtained.                    - After reviewing the risks and benefits, the patient was                     deemed in satisfactory condition to undergo the procedure.                    After obtaining informed consent, the endoscope was passed                     under direct vision. Throughout the procedure, the                     patient's blood pressure, pulse, and oxygen saturations                     were monitored continuously. The Endoscope was introduced                     through the mouth, and advanced to the second part of                     duodenum. The upper GI endoscopy was accomplished without                     difficulty. The patient tolerated the procedure fairly  well. Findings:      The examined esophagus was normal.      Multiple small sessile  polyps with no bleeding and superficial       ulcerations were found in the gastric body. No active bleeding now but       these ulcers could have bled yesterday.      The exam was otherwise without abnormality.      No bleeding AVM's      A small diverticulum was found in the second part of the duodenum.      There was a small lipoma in the third part of the duodenum.      The exam was otherwise without abnormality. Impression:        - Normal esophagus.                    - Multiple gastric polyps.                    - The examination was otherwise normal.                    - Duodenal diverticulum.                    - Duodenal lipoma.                    - The examination was otherwise normal.                    - No specimens collected. Recommendation:    - Observe patient's clinical course.                    - Continue present medications.                    - The findings and recommendations were discussed with the                     patient.                    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 All rights reserved. The codes documented in this report are preliminary and upon coder review may  be revised to meet current compliance requirements. Hulen Luster, MD 05/07/2015 2:58:24 PM This report has been signed electronically. Number of Addenda: 0 Note Initiated On: 05/07/2015 2:30 PM      [facility]

## 2015-05-07 NOTE — Progress Notes (Signed)
RN notified Dr Serita Grit to clarify if MD wants labdrawn to clarify fsbs result first.  MD states "no, if the accucheck is able to pick it up showing greater than 400 you don't need a lab draw."  RN notified MD that order for novolog can't be placed in [facility] as subq.  MD states "give 20 unit of reguar insulin IV once then"

## 2015-05-07 NOTE — Progress Notes (Signed)
Inpatient Diabetes Program Recommendations  AACE/ADA: New Consensus Statement on Inpatient Glycemic Control (2013)  Target Ranges:  Prepandial:   less than 140 mg/dL      Peak postprandial:   less than 180 mg/dL (1-2 hours)      Critically ill patients:  140 - 180 mg/dL    In order to improve blood sugar control in a safe manner with decreased risk of hypoglycemia, consider starting patient on IV insulin- ICU Glycemic Control Phase 2 order set.   Gentry Fitz, RN, BA, MHA, CDE Diabetes Coordinator Inpatient Diabetes Program  [phone] (Team Pager) [phone] ([place]) 05/07/2015 9:15 AM

## 2015-05-07 NOTE — Progress Notes (Signed)
RN told Rod Can that patient received 20units of regular insulin IV per Dr Posey Pronto for fsbs of 440.  RN already already gave report to Endo and patient will be sent to 211 after scope.

## 2015-05-08 ENCOUNTER — Inpatient Hospital Stay: Payer: Commercial Managed Care - HMO

## 2015-05-08 ENCOUNTER — Encounter: Payer: Self-pay | Admitting: Gastroenterology

## 2015-05-08 LAB — BASIC METABOLIC PANEL
Anion gap: 11 (ref 5–15)
BUN: 36 mg/dL — AB (ref 6–20)
CALCIUM: 8 mg/dL — AB (ref 8.9–10.3)
CHLORIDE: 99 mmol/L — AB (ref 101–111)
CO2: 24 mmol/L (ref 22–32)
Creatinine, Ser: 1.23 mg/dL (ref 0.61–1.24)
GFR calc non Af Amer: 56 mL/min — ABNORMAL LOW (ref >60)
Glucose, Bld: 405 mg/dL — ABNORMAL HIGH (ref 65–99)
Potassium: 4.9 mmol/L (ref 3.5–5.1)
SODIUM: 134 mmol/L — AB (ref 135–145)

## 2015-05-08 LAB — CBC
HCT: 21 % — ABNORMAL LOW (ref 40.0–52.0)
Hemoglobin: 6.6 g/dL — ABNORMAL LOW (ref 13.0–18.0)
MCH: 26.4 pg (ref 26.0–34.0)
MCHC: 31.4 g/dL — AB (ref 32.0–36.0)
MCV: 84 fL (ref 80.0–100.0)
PLATELETS: 225 10*3/uL (ref 150–440)
RBC: 2.5 MIL/uL — AB (ref 4.40–5.90)
RDW: 16.5 % — AB (ref 11.5–14.5)
WBC: 13.3 10*3/uL — ABNORMAL HIGH (ref 3.8–10.6)

## 2015-05-08 LAB — GLUCOSE, CAPILLARY
Glucose-Capillary: 385 mg/dL — ABNORMAL HIGH (ref 65–99)
Glucose-Capillary: 384 mg/dL — ABNORMAL HIGH (ref 65–99)
Glucose-Capillary: 380 mg/dL — ABNORMAL HIGH (ref 65–99)
Glucose-Capillary: 304 mg/dL — ABNORMAL HIGH (ref 65–99)

## 2015-05-08 LAB — PREPARE RBC (CROSSMATCH)

## 2015-05-08 MED ORDER — FERROUS SULFATE 325 (65 FE) MG PO TABS
650.0000 mg | ORAL_TABLET | Freq: Every day | ORAL | Status: DC
  Administered 2015-05-08 – 2015-05-13 (×6): 650 mg via ORAL
  Filled 2015-05-08 (×6): qty 2

## 2015-05-08 MED ORDER — ONDANSETRON HCL 4 MG/2ML IJ SOLN
4.0000 mg | Freq: Four times a day (QID) | INTRAMUSCULAR | Status: DC | PRN
  Administered 2015-05-08 – 2015-05-09 (×2): 4 mg via INTRAVENOUS
  Filled 2015-05-08 (×2): qty 2

## 2015-05-08 MED ORDER — PREDNISONE 10 MG PO TABS: 10.0000 mg | ORAL_TABLET | Freq: Every day | ORAL | Status: DC

## 2015-05-08 MED ORDER — PREDNISONE 20 MG PO TABS: 40.0000 mg | ORAL_TABLET | Freq: Every day | ORAL | Status: DC

## 2015-05-08 MED ORDER — CITALOPRAM HYDROBROMIDE 20 MG PO TABS
40.0000 mg | ORAL_TABLET | Freq: Every day | ORAL | Status: DC
  Administered 2015-05-08 – 2015-05-13 (×6): 40 mg via ORAL
  Filled 2015-05-08 (×6): qty 2

## 2015-05-08 MED ORDER — INSULIN ASPART 100 UNIT/ML ~~LOC~~ SOLN
5.0000 [IU] | Freq: Once | SUBCUTANEOUS | Status: AC
  Administered 2015-05-08: 5 [IU] via SUBCUTANEOUS
  Filled 2015-05-08: qty 5

## 2015-05-08 MED ORDER — ALPRAZOLAM 0.5 MG PO TABS: 0.5000 mg | ORAL_TABLET | Freq: Two times a day (BID) | ORAL | Status: DC | PRN

## 2015-05-08 MED ORDER — PREDNISONE 50 MG PO TABS
60.0000 mg | ORAL_TABLET | Freq: Every day | ORAL | Status: AC
  Administered 2015-05-09: 60 mg via ORAL
  Filled 2015-05-08: qty 1

## 2015-05-08 MED ORDER — LIVING WELL WITH DIABETES BOOK
Freq: Once | Status: DC
  Filled 2015-05-08: qty 1

## 2015-05-08 MED ORDER — PREDNISONE 20 MG PO TABS: 20.0000 mg | ORAL_TABLET | Freq: Every day | ORAL | Status: DC

## 2015-05-08 MED ORDER — SUCRALFATE 1 G PO TABS
1.0000 g | ORAL_TABLET | Freq: Three times a day (TID) | ORAL | Status: DC
  Administered 2015-05-08 – 2015-05-11 (×10): 1 g via ORAL
  Filled 2015-05-08 (×10): qty 1

## 2015-05-08 MED ORDER — DIVALPROEX SODIUM 250 MG PO DR TAB
500.0000 mg | DELAYED_RELEASE_TABLET | Freq: Every day | ORAL | Status: DC
  Administered 2015-05-08 – 2015-05-12 (×5): 500 mg via ORAL
  Filled 2015-05-08 (×5): qty 2

## 2015-05-08 MED ORDER — INSULIN GLARGINE 100 UNIT/ML ~~LOC~~ SOLN
17.0000 [IU] | Freq: Every day | SUBCUTANEOUS | Status: DC
  Administered 2015-05-09 – 2015-05-11 (×3): 17 [IU] via SUBCUTANEOUS
  Filled 2015-05-08 (×4): qty 0.17

## 2015-05-08 MED ORDER — METFORMIN HCL 500 MG PO TABS
500.0000 mg | ORAL_TABLET | Freq: Every day | ORAL | Status: DC
Start: 2015-05-08 — End: 2015-05-10
  Administered 2015-05-08 – 2015-05-09 (×2): 500 mg via ORAL
  Filled 2015-05-08 (×2): qty 1

## 2015-05-08 MED ORDER — INSULIN ASPART 100 UNIT/ML ~~LOC~~ SOLN
10.0000 [IU] | Freq: Three times a day (TID) | SUBCUTANEOUS | Status: DC
  Administered 2015-05-08 – 2015-05-11 (×8): 10 [IU] via SUBCUTANEOUS
  Filled 2015-05-08 (×6): qty 10

## 2015-05-08 MED ORDER — FOLIC ACID 1 MG PO TABS
2.0000 mg | ORAL_TABLET | Freq: Every morning | ORAL | Status: DC
  Administered 2015-05-08 – 2015-05-13 (×6): 2 mg via ORAL
  Filled 2015-05-08 (×6): qty 2

## 2015-05-08 MED ORDER — HYDROCODONE-ACETAMINOPHEN 5-325 MG PO TABS
1.0000 | ORAL_TABLET | ORAL | Status: DC | PRN
  Administered 2015-05-08 – 2015-05-13 (×5): 1 via ORAL
  Filled 2015-05-08 (×5): qty 1

## 2015-05-08 MED ORDER — INSULIN STARTER KIT- PEN NEEDLES (ENGLISH): 1.0000 | Freq: Once | Status: DC

## 2015-05-08 MED ORDER — PREDNISONE 20 MG PO TABS: 30.0000 mg | ORAL_TABLET | Freq: Every day | ORAL | Status: DC

## 2015-05-08 MED ORDER — SODIUM CHLORIDE 0.9 % IV SOLN
Freq: Once | INTRAVENOUS | Status: AC
  Administered 2015-05-08: 12:00:00 via INTRAVENOUS

## 2015-05-08 MED ORDER — PREDNISONE 50 MG PO TABS
50.0000 mg | ORAL_TABLET | Freq: Every day | ORAL | Status: AC
  Administered 2015-05-10: 50 mg via ORAL
  Filled 2015-05-08: qty 1

## 2015-05-08 NOTE — Care Management Important Message (Signed)
Important Message  Patient Details  Name: [name] MRN: [id number] Date of Birth: 07-27-1941   Medicare Important Message Given:  Yes-second notification given    Juliann Pulse A Allmond 05/08/2015, 9:08 AM

## 2015-05-08 NOTE — Progress Notes (Signed)
[place] at [facility]                                                                                                                                                                                            Patient Demographics   [name], is a 74 y.o. male, DOB - 03-Jul-1941, ZOX:[id number]  Admit date - 05/06/2015   Admitting Physician Epifanio Lesches, Howard  Outpatient Primary Howard for the patient is Shakima Nisley, Chana Bode, Howard   LOS - 2  Subjective:  He reports that he is feeling little better hemoglobin dropped further. He lost his IV site. Denies any further bleeding Review of Systems:   CONSTITUTIONAL: No documented fever.  positiveatigue, weakness. No weight gain, no weight loss.  EYES: No blurry or double vision.  ENT: No tinnitus. No postnasal drip. No redness of the oropharynx.  RESPIRATORY: Positive cough, no wheeze, no hemoptysis. Positive dyspnea.  CARDIOVASCULAR: No chest pain. No orthopnea. No palpitations. No syncope.  GASTROINTESTINAL: No nausea, no vomiting or diarrhea. No abdominal pain. No melena or hematochezia.  GENITOURINARY: No dysuria or hematuria.  ENDOCRINE: No polyuria or nocturia. No heat or cold intolerance.  HEMATOLOGY: No anemia. No bruising. No bleeding.  INTEGUMENTARY: No rashes. No lesions.  MUSCULOSKELETAL: No arthritis. No swelling. No gout.  NEUROLOGIC: No numbness, tingling, or ataxia. No seizure-type activity.  PSYCHIATRIC: No anxiety. No insomnia. No ADD.    Vitals:   Filed Vitals:   05/08/15 0044 05/08/15 0734 05/08/15 0803 05/08/15 1200  BP: 101/55 106/58  120/64  Pulse: 104 102  100  Temp: 98.6 F (37 C) 97.8 F (36.6 C)  97.9 F (36.6 C)  TempSrc:  Oral  Oral  Resp: 20 17  17   Height:      Weight:      SpO2: 97% 94% 97% 98%    Wt Readings from Last 3 Encounters:  05/07/15 117.845 kg (259 lb 12.8 oz)  05/04/15 120.702 kg (266 lb 1.6 oz)  04/27/15 118.026 kg (260 lb 3.2 oz)      Intake/Output Summary (Last 24 hours) at 05/08/15 1213 Last data filed at 05/08/15 0939  Gross per 24 hour  Intake    680 ml  Output   1650 ml  Net   -970 ml    Physical Exam:   GENERAL: Pleasant-appearing in no apparent distress.  HEAD, EYES, EARS, NOSE AND THROAT: Atraumatic, normocephalic. Extraocular muscles are intact. Pupils equal and reactive to light. Sclerae anicteric. No conjunctival injection. No oro-pharyngeal erythema.  NECK: Supple. There is no jugular venous distention. No bruits, no lymphadenopathy, no thyromegaly.  HEART:  Regular rate and rhythm, tachycardic. No murmurs, no rubs, no clicks.  LUNGS: Clear to auscultation bilaterally. No rales or rhonchi. No wheezes.  ABDOMEN: Soft, flat, nontender, nondistended. Has good bowel sounds. No hepatosplenomegaly appreciated.  EXTREMITIES: No evidence of any cyanosis, clubbing, or peripheral edema.  +2 pedal and radial pulses bilaterally.  NEUROLOGIC: The patient is alert, awake, and oriented x3 with no focal motor or sensory deficits appreciated bilaterally.  SKIN: Moist and warm with no rashes appreciated.  Psych: Not anxious, depressed LN: No inguinal LN enlargement    Antibiotics   Anti-infectives    None      Medications   Scheduled Meds: . atorvastatin  40 mg Oral QHS  . budesonide-formoterol  2 puff Inhalation BID  . insulin aspart  0-20 Units Subcutaneous TID WC  . insulin glargine  15 Units Subcutaneous Daily  . ipratropium-albuterol  3 mL Nebulization Q6H  . living well with diabetes book   Does not apply Once  . pantoprazole (PROTONIX) IV  40 mg Intravenous Q12H  . [START ON 05/09/2015] predniSONE  60 mg Oral Q breakfast   Followed by  . [START ON 05/10/2015] predniSONE  50 mg Oral Q breakfast   Followed by  . [START ON 05/11/2015] predniSONE  40 mg Oral Q breakfast   Followed by  . [START ON 05/12/2015] predniSONE  30 mg Oral Q breakfast   Followed by  . [START ON 05/13/2015] predniSONE  20 mg  Oral Q breakfast   Followed by  . [START ON 05/14/2015] predniSONE  10 mg Oral Q breakfast  . tiotropium  18 mcg Inhalation Daily  . traZODone  50 mg Oral TID  . Vitamin D (Ergocalciferol)  50,000 Units Oral Q7 days   Continuous Infusions: . sodium chloride     PRN Meds:.acetaminophen, albuterol, ALPRAZolam, calcium carbonate, zolpidem   Data Review:   Micro Results Recent Results (from the past 240 hour(s))  MRSA PCR Screening     Status: None   Collection Time: 05/06/15  7:29 PM  Result Value Ref Range Status   MRSA by PCR NEGATIVE NEGATIVE Final    Comment:        The GeneXpert MRSA Assay (FDA approved for NASAL specimens only), is one component of a comprehensive MRSA colonization surveillance program. It is not intended to diagnose MRSA infection nor to guide or monitor treatment for MRSA infections.     Radiology Reports Ct Angio Chest Pe W/cm &/or Wo Cm  05/03/2015   CLINICAL DATA:  Increase shortness of breath of the last 2 days. Dizziness. History of CABG, COPD, AAA repair, diabetes, CHF, CAD, AVM.  EXAM: CT ANGIOGRAPHY CHEST WITH CONTRAST  TECHNIQUE: Multidetector CT imaging of the chest was performed using the standard protocol during bolus administration of intravenous contrast. Multiplanar CT image reconstructions and MIPs were obtained to evaluate the vascular anatomy.  CONTRAST:  78mL OMNIPAQUE IOHEXOL 350 MG/ML SOLN  COMPARISON:  04/23/2015  FINDINGS: Heart: Heart is mildly enlarged. No pericardial effusion. Significant coronary artery disease. Previous median sternotomy and CABG.  Vascular structures: Pulmonary arteries are well opacified. There is no evidence for acute pulmonary embolus. There is atherosclerotic calcification of the thoracic arch not associated with aneurysm or dissection.  Mediastinum/thyroid: The visualized portion of the thyroid gland has a normal appearance. Small mediastinal lymph nodes are noted, nonspecific in appearance. Large hiatal  hernia is present.  Lungs/Airways: Emphysematous changes are present. Large bulla in the left lung apex again noted. No focal consolidations  or pleural effusions. There is mild bronchiectasis in the left lung base. No suspicious pulmonary nodules.  Upper abdomen: Large hiatal hernia.  Colonic diverticulosis.  Chest wall/osseous structures: Status post median sternotomy. No suspicious lytic or blastic lesions are identified. Chronic left rib fracture.  Review of the MIP images confirms the above findings.  IMPRESSION: 1. Technically adequate exam showing no pulmonary embolus. 2. Cardiomegaly and significant coronary artery disease. 3. Emphysematous changes.  Bronchiectasis left lung base. 4. Large hiatal hernia. 5. Colonic diverticulosis.   Electronically Signed   By: Nolon Nations M.D.   On: 05/03/2015 09:34   Ct Abdomen Pelvis W Contrast  04/22/2015   CLINICAL DATA:  Generalized abdominal pain and melena.  EXAM: CT ABDOMEN AND PELVIS WITH CONTRAST  TECHNIQUE: Multidetector CT imaging of the abdomen and pelvis was performed using the standard protocol following bolus administration of intravenous contrast.  CONTRAST:  149mL OMNIPAQUE IOHEXOL 300 MG/ML  SOLN  COMPARISON:  12/29/2014  FINDINGS: BODY WALL: Fatty bilateral inguinal hernias with early herniation of small bowel on the right.  LOWER CHEST: Large sliding hiatal hernia without obstruction or inflammatory change.  Mild atelectasis at the left base.  Coronary atherosclerosis.  ABDOMEN/PELVIS:  Liver: No focal abnormality.  Biliary: No evidence of biliary obstruction or stone.  Pancreas: Unremarkable.  Spleen: Unremarkable.  Adrenals: Unremarkable.  Kidneys and ureters: No hydronephrosis or stone. Stable scarring to the lower pole right kidney, likely from covering of a lower pole accessory renal artery.  Bladder: Unremarkable.  Reproductive: No pathologic findings.  Bowel: Appendectomy. There is colonic diverticulosis, present mainly distally. Large  sliding hiatal hernia. No inflammatory bowel wall thickening. A small distal duodenum diverticulum is noted, uncomplicated. No evidence of peptic ulcer disease, aortoenteric fistula or other explanation for melena.  Retroperitoneum: No mass or adenopathy.  Peritoneum: No ascites or pneumoperitoneum.  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 No evidence of major vessel stenosis or occlusion. Small volume gas within the right common femoral vein is presumably from vascular access. There is been right hypogastric artery embolization.  OSSEOUS: No acute abnormalities.  IMPRESSION: 1. No acute finding or explanation for melena. 2. Incidental findings are stable from prior study and noted above.   Electronically Signed   By: Monte Fantasia M.D.   On: 04/22/2015 03:55   Ct Chest High Resolution  04/23/2015   CLINICAL DATA:  74 year old male with history of cough producing blackish phlegm. History of GI bleeding.  EXAM: CT CHEST WITHOUT CONTRAST  TECHNIQUE: Multidetector CT imaging of the chest was performed following the standard protocol without intravenous contrast. High resolution imaging of the lungs, as well as inspiratory and expiratory imaging, was performed.  COMPARISON:  Chest CT 05/15/2013.  FINDINGS: Mediastinum/Lymph Nodes: Heart size is normal. There is no significant pericardial fluid, thickening or pericardial calcification. There is atherosclerosis of the thoracic aorta, the great vessels of the mediastinum and the coronary arteries, including calcified atherosclerotic plaque in the left main, left anterior descending and right coronary arteries. Status post median sternotomy for CABG, including LIMA to the LAD. No pathologically enlarged mediastinal or hilar lymph nodes. Please note that accurate exclusion of hilar adenopathy is limited on noncontrast CT scans. Large hiatal hernia. No axillary lymphadenopathy.  Lungs/Pleura: There  is some focal architectural distortion, what appears to be chronic volume loss, and some mild peribronchovascular ground-glass attenuation in the left lower lobe, which is favored to reflect a combination of chronic  scarring and atelectasis, as well as some peribronchovascular inflammation and mild fibrosis, potentially related to chronic recurrent aspiration in the setting of this large hiatal hernia. High-resolution images otherwise demonstrate no significant additional areas of ground-glass attenuation, subpleural reticulation, parenchymal banding, traction bronchiectasis or frank honeycombing in other portions of the lungs to suggest a background of interstitial lung disease. Inspiratory and expiratory imaging is unremarkable. Mild diffuse bronchial wall thickening with generally mild centrilobular and paraseptal emphysema. There is a large bulla in the apex of the left upper lobe. No acute consolidative airspace disease. No pleural effusions. No suspicious appearing pulmonary nodules or masses.  Upper Abdomen: Unremarkable.  Musculoskeletal/Soft Tissues: Median sternotomy wires. There are no aggressive appearing lytic or blastic lesions noted in the visualized portions of the skeleton.  IMPRESSION: 1. There are no findings to strongly suggest interstitial lung disease on today's examination. 2. There is a spectrum of findings in the left lower lobe, suggestive of some chronic scarring, atelectasis and likely mild fibrosis related to recurrent aspiration or prior infection, as discussed above. 3. Large hiatal hernia. 4. Mild diffuse bronchial wall thickening with mild centrilobular and paraseptal emphysema, as well as a large left apical bulla. 5. Atherosclerosis, including left main and 3 vessel coronary artery disease. Status post median sternotomy for CABG, including LIMA to the LAD.   Electronically Signed   By: Vinnie Langton M.D.   On: 04/23/2015 13:14   Dg Chest Port 1 View  05/08/2015   CLINICAL  DATA:  PICC line placement.  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 Right PICC has been placed and terminates over the lower SVC. Cardiomediastinal silhouette is unchanged, with increased density in the lower mediastinum consistent with known hiatal hernia. Bullous change is again seen in the left lung apex. Mild left basilar opacity remains, likely atelectasis or scarring. There is no evidence of pulmonary edema, sizable pleural effusion, or pneumothorax. No acute osseous abnormality is seen.  IMPRESSION: 1. Right PICC terminates over the lower SVC. 2. No evidence of acute cardiopulmonary process.   Electronically Signed   By: Logan Bores   On: 05/08/2015 11:37   Dg Chest Port 1 View  05/06/2015   CLINICAL DATA:  Shortness of Breath  EXAM: PORTABLE CHEST - 1 VIEW  COMPARISON:  Chest radiograph and chest CT May 02, 2014  FINDINGS: There is a stable large bulla in the left upper lobe. There is flattening of the hemidiaphragms. There is no frank edema or consolidation. The heart is mildly enlarged with pulmonary vascularity within normal limits. There is a sizable hiatal type hernia. Patient is status post coronary artery bypass grafting. No adenopathy.  IMPRESSION: Underlying bullous emphysematous change. No edema or consolidation. Stable cardiac prominence. Sizable hiatal type hernia.   Electronically Signed   By: Lowella Grip III M.D.   On: 05/06/2015 14:54   Dg Chest Portable 1 View  05/03/2015   CLINICAL DATA:  Worsening shortness of breath. History of COPD, chest pain, CHF, diabetes, hypertension.  EXAM: PORTABLE CHEST - 1 VIEW  COMPARISON:  Chest radiograph April 22, 2015 and, CT chest April 23, 2015  FINDINGS: The cardiac silhouette appears moderately enlarged, status post median sternotomy. Increased lung volumes, consistent with history of COPD without pleural effusion or focal consolidation. Stable LEFT lung base atelectasis.  Apical pleural thickening. No pneumothorax. Soft tissue planes and included osseous structure nonsuspicious.  IMPRESSION: Stable cardiomegaly and COPD.  Stable  LEFT lung base atelectasis.   Electronically Signed   By: Elon Alas M.D.   On: 05/03/2015 06:32   Dg Chest Portable 1 View  04/22/2015   CLINICAL DATA:  Acute onset of bilateral chest pain and generalized abdominal pain. Shortness of breath. Initial encounter.  EXAM: PORTABLE CHEST - 1 VIEW  COMPARISON:  Chest radiograph performed 04/10/2015  FINDINGS: The lungs are well-aerated. A small left pleural effusion is noted. Pulmonary vascularity is at the upper limits of normal. Left basilar opacity likely reflects atelectasis. There is no evidence of pneumothorax.  The cardiomediastinal silhouette is mildly enlarged. The patient is status post median sternotomy. No acute osseous abnormalities are seen.  IMPRESSION: Small left pleural effusion noted. Mild cardiomegaly. Persistent mild left basilar airspace opacity likely reflects atelectasis.   Electronically Signed   By: Garald Balding M.D.   On: 04/22/2015 01:19   Dg Chest Port 1 View  04/10/2015   CLINICAL DATA:  Acute onset of cough and difficulty breathing. Initial encounter.  EXAM: PORTABLE CHEST - 1 VIEW  COMPARISON:  Chest radiograph performed 12/04/2013  FINDINGS: The lungs are well-aerated. Mild left basilar opacity likely reflects atelectasis. There is no evidence of pleural effusion or pneumothorax.  The cardiomediastinal silhouette is mildly enlarged. The patient is status post median sternotomy. No acute osseous abnormalities are seen.  IMPRESSION: Mild left basilar opacity likely reflects atelectasis. Mild cardiomegaly.   Electronically Signed   By: Garald Balding M.D.   On: 04/10/2015 05:59     CBC  Recent Labs Lab 05/04/15 0026 05/06/15 1354 05/06/15 1920 05/07/15 0714 05/08/15 0628  WBC 5.2 14.8* 14.0* 11.0* 13.3*  HGB 7.8* 7.7* 8.6* 7.6* 6.6*  HCT 25.2* 25.2* 27.7*  24.1* 21.0*  PLT 263 379 272 297 225  MCV 86.7 86.9 85.4 85.3 84.0  MCH 27.0 26.5 26.4 27.0 26.4  MCHC 31.2* 30.5* 31.0* 31.7* 31.4*  RDW 17.1* 16.4* 16.5* 17.0* 16.5*  LYMPHSABS  --  2.1  --   --   --   MONOABS  --  1.2*  --   --   --   EOSABS  --  0.0  --   --   --   BASOSABS  --  0.0  --   --   --     Chemistries   Recent Labs Lab 05/03/15 0628 05/04/15 0026 05/06/15 1354 05/07/15 0714 05/08/15 0628  NA 140 140 138 134* 134*  K 3.9 4.1 5.0 4.9 4.9  CL 101 104 102 100* 99*  CO2 33* 30 29 25 24   GLUCOSE 200* 321* 388* 476* 405*  BUN 12 9 34* 43* 36*  CREATININE 0.89 1.01 1.21 1.25* 1.23  CALCIUM 8.3* 7.9* 8.3* 8.1* 8.0*  AST 15  --   --   --   --   ALT 15*  --   --   --   --   ALKPHOS 87  --   --   --   --   BILITOT 0.4  --   --   --   --    ------------------------------------------------------------------------------------------------------------------ estimated creatinine clearance is 72.9 mL/min (by C-G formula based on Cr of 1.23). ------------------------------------------------------------------------------------------------------------------ No results for input(s): HGBA1C in the last 72 hours. ------------------------------------------------------------------------------------------------------------------ No results for input(s): CHOL, HDL, LDLCALC, TRIG, CHOLHDL, LDLDIRECT in the last 72 hours. ------------------------------------------------------------------------------------------------------------------ No results for input(s): TSH, T4TOTAL, T3FREE, THYROIDAB in the last 72 hours.  Invalid input(s): FREET3 ------------------------------------------------------------------------------------------------------------------ No results for input(s): VITAMINB12, FOLATE, FERRITIN, TIBC, IRON,  RETICCTPCT in the last 72 hours.  Coagulation profile No results for input(s): INR, PROTIME in the last 168 hours.  No results for input(s): DDIMER in the last 72  hours.  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 #3 acute on chronic COPD exasperation continue nebulizers and breathing treatments, change IV Solu-Medrol to prednisone #4  anxiety patient continue Ativan resume his home medications which include Xanax and Celexa #5 hypotension blood pressure now improved #6 type 2 diabetes mellitus poor control resume home dose insulin    Code Status Orders        Start     Ordered   05/06/15 1711  Full code   Continuous     05/06/15 1714           Consults  GI  DVT Prophylaxis SCDs   Lab Results  Component Value Date   PLT 225 05/08/2015     Time Spent in minutes  35  Greater than 50% of time spent in care coordination and counseling with the patient  Dustin Flock M.D on 05/08/2015 at 12:13 PM  Between 7am to 6pm - Pager - [phone]  After 6pm go to [email or web address] - password [email or web address] [place] [facility]   Office  [phone]

## 2015-05-08 NOTE — Progress Notes (Signed)
Inpatient Diabetes Program Recommendations  AACE/ADA: New Consensus Statement on Inpatient Glycemic Control (2013)  Target Ranges:  Prepandial:   less than 140 mg/dL      Peak postprandial:   less than 180 mg/dL (1-2 hours)      Critically ill patients:  140 - 180 mg/dL   Results for [name], [name] (MRN [id number]) as of 05/08/2015 08:30  Ref. Range 05/06/2015 18:48 05/07/2015 08:58 05/07/2015 11:44 05/07/2015 16:35 05/08/2015 07:32  Glucose-Capillary Latest Ref Range: 65-99 mg/dL 388 (H) 434 (H) 440 (H) 377 (H) 385 (H)    Reason for assessment: elevated CBG  Diabetes history: Type 2  Outpatient Diabetes medications: This list of diabetes medications was given to the patient when he discharged; Med reconciliation states he was not taking them because he never filled the prescriptions.   insulin aspart (NOVOLOG) 100 UNIT/ML injection Inject 0-15 Units into the skin 4 (four) times daily - before meals and at bedtime. Qty: 10 mL, Refills: 11  insulin aspart (NOVOLOG) 100 UNIT/ML injection Inject 10 Units into the skin 3 (three) times daily with meals. Qty: 10 mL, Refills: 11    insulin glargine (LANTUS) 100 UNIT/ML injection Inject 0.17 mLs (17 Units total) into the skin daily. Qty: 10 mL, Refills: 11    metFORMIN (GLUCOPHAGE) 500 MG tablet Take 1 tablet (500 mg total) by mouth daily. Qty: 30 tablet, Refills: 1               Current orders for Inpatient glycemic control: Lantus 15 units qhs, Novolog correction 0-20 tid  Please consider increasing Lantus to 20 units qhs, adding Novolog 10 units tid with meals (to be held if patient eats less than 50%), add adding Novolog 0-5 units qhs   Gentry Fitz, RN, [place], [place], CDE Diabetes Coordinator Inpatient Diabetes Program  [phone] (Team Pager) [phone] ([place]) 05/08/2015 8:34 AM

## 2015-05-08 NOTE — Progress Notes (Signed)
PT Cancellation Note  Patient Details Name: [name] MRN: [id number] DOB: 1941-01-13   Cancelled Treatment:    Reason Eval/Treat Not Completed: Patient at procedure or test/unavailable.  Try again later.   [name] 05/08/2015, 10:34 AM   [name], PT MS Acute Rehab Dept. Number: [phone] [id number] and [place] [phone]

## 2015-05-08 NOTE — Care Management Note (Addendum)
Case Management Note  Patient Details  Name: [name] MRN: [id number] Date of Birth: July 06, 1941  Subjective/Objective:     Patient lives at home with spouse and two boarders who rent rooms at his home. He has O2 with [place].  He has been open to [facility] prior and plan is to discharge with services from [facility] under the [facility] initiative. Dr Doy Hutching has approved. Patient has walker and cane at home and stated that he does not leave the house much. Was scheduled to see Dr Ouida Sills for follow up at last discharge but was re admitted and missed appointment.  Patient stated that he is a member of COPD Gold program,   But does not fully understand. Also poor understanding of diabetes.  Diabetes counseling notes patient has been non compliant with medications.             Action/Plan:  [place], PT, RN, AID, CSW. COPD Gold, Diabetes counceling.   Expected Discharge Date:                  Expected Discharge Plan:  [place]  In-House Referral:  NA  Discharge planning Services  CM Consult  Post Acute Care Choice:  Home Health Choice offered to:  Patient  DME Arranged:  N/A DME Agency:  [place] Arranged:  RN, PT, Nurse's Aide, Disease Management [place] Agency:  [facility]  Status of Service:     Medicare Important Message Given:  Yes-second notification given Date Medicare IM Given:    Medicare IM give by:    Date Additional Medicare IM Given:    Additional Medicare Important Message give by:     If discussed at [place] of Stay Meetings, dates discussed:    Additional Comments:  Alvie Heidelberg, RN 05/08/2015, 11:52 AM

## 2015-05-08 NOTE — Telephone Encounter (Signed)
Rx called into [facility] pharmacy Called and left detailed message on voicemail advising patient that RX was called into pharmacy. Nothing further needed.

## 2015-05-08 NOTE — Progress Notes (Addendum)
Pt with no IV access. Pt unable to receive Blood products at this time. After multiple IV attempts on day shift, Dr Bridgett Larsson aware and will decide in am if patient is a candidate for picc line per day shift nurse Estanislado Spire. Noted patient with LAC #20g, per patient it hurts and is uncomfortable. Pt with c/o of chronic back, neck and hip pain and L hand hurts. Pain rated 9/10. Prn tylenol given per M.D. Order. Bed exit alarm placed and call light and phone in reach. Will continue to monitor .

## 2015-05-08 NOTE — Progress Notes (Signed)
[place] [facility]                                                                                                                                                                                            Patient Demographics   [name], is a 74 y.o. 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 He denies any chest pain complains of shortness of breath. Also reports that he didn't have any Xanax to take at home and had a panic attack made h   his shortness of breath worst Review of Systems:   CONSTITUTIONAL: No documented fever.  positiveatigue, weakness. No weight gain, no weight loss.  EYES: No blurry or double vision.  ENT: No tinnitus. No postnasal drip. No redness of the oropharynx.  RESPIRATORY: No cough, no wheeze, no hemoptysis. No dyspnea.  CARDIOVASCULAR: No chest pain. No orthopnea. No palpitations. No syncope.  GASTROINTESTINAL: No nausea, no vomiting or diarrhea. No abdominal pain. No melena or hematochezia.  GENITOURINARY: No dysuria or hematuria.  ENDOCRINE: No polyuria or nocturia. No heat or cold intolerance.  HEMATOLOGY: No anemia. No bruising. No bleeding.  INTEGUMENTARY: No rashes. No lesions.  MUSCULOSKELETAL: No arthritis. No swelling. No gout.  NEUROLOGIC: No numbness, tingling, or ataxia. No seizure-type activity.  PSYCHIATRIC: No anxiety. No insomnia. No ADD.    Vitals:   Filed Vitals:   05/08/15 0044 05/08/15 0734 05/08/15 0803 05/08/15 1200  BP: 101/55 106/58  120/64  Pulse: 104 102  100  Temp: 98.6 F (37 C) 97.8 F (36.6 C)  97.9 F (36.6 C)  TempSrc:  Oral  Oral  Resp: 20 17  17   Height:      Weight:      SpO2: 97% 94% 97%  98%    Wt Readings from Last 3 Encounters:  05/07/15 117.845 kg (259 lb 12.8 oz)  05/04/15 120.702 kg (266 lb 1.6 oz)  04/27/15 118.026 kg (260 lb 3.2 oz)     Intake/Output Summary (Last 24 hours) at 05/08/15 1207 Last data filed at 05/08/15 0939  Gross per 24 hour  Intake    680 ml  Output   1650 ml  Net   -970 ml    Physical Exam:   GENERAL: Pleasant-appearing in no apparent distress.  HEAD, EYES, EARS, NOSE AND THROAT:  Atraumatic, normocephalic. Extraocular muscles are intact. Pupils equal and reactive to light. Sclerae anicteric. No conjunctival injection. No oro-pharyngeal erythema.  NECK: Supple. There is no jugular venous distention. No bruits, no lymphadenopathy, no thyromegaly.  HEART: Regular rate and rhythm, tachycardic. No murmurs, no rubs, no clicks.  LUNGS: Clear to auscultation bilaterally. No rales or rhonchi. No wheezes.  ABDOMEN: Soft, flat, nontender, nondistended. Has good bowel sounds. No hepatosplenomegaly appreciated.  EXTREMITIES: No evidence of any cyanosis, clubbing, or peripheral edema.  +2 pedal and radial pulses bilaterally.  NEUROLOGIC: The patient is alert, awake, and oriented x3 with no focal motor or sensory deficits appreciated bilaterally.  SKIN: Moist and warm with no rashes appreciated.  Psych: Not anxious, depressed LN: No inguinal LN enlargement    Antibiotics   Anti-infectives    None      Medications   Scheduled Meds: . atorvastatin  40 mg Oral QHS  . budesonide-formoterol  2 puff Inhalation BID  . insulin aspart  0-20 Units Subcutaneous TID WC  . insulin glargine  15 Units Subcutaneous Daily  . ipratropium-albuterol  3 mL Nebulization Q6H  . living well with diabetes book   Does not apply Once  . pantoprazole (PROTONIX) IV  40 mg Intravenous Q12H  . [START ON 05/09/2015] predniSONE  60 mg Oral Q breakfast   Followed by  . [START ON 05/10/2015] predniSONE  50 mg Oral Q breakfast   Followed by  . [START ON 05/11/2015]  predniSONE  40 mg Oral Q breakfast   Followed by  . [START ON 05/12/2015] predniSONE  30 mg Oral Q breakfast   Followed by  . [START ON 05/13/2015] predniSONE  20 mg Oral Q breakfast   Followed by  . [START ON 05/14/2015] predniSONE  10 mg Oral Q breakfast  . tiotropium  18 mcg Inhalation Daily  . traZODone  50 mg Oral TID  . Vitamin D (Ergocalciferol)  50,000 Units Oral Q7 days   Continuous Infusions: . sodium chloride     PRN Meds:.acetaminophen, albuterol, ALPRAZolam, calcium carbonate, zolpidem   Data Review:   Micro Results Recent Results (from the past 240 hour(s))  MRSA PCR Screening     Status: None   Collection Time: 05/06/15  7:29 PM  Result Value Ref Range Status   MRSA by PCR NEGATIVE NEGATIVE Final    Comment:        The GeneXpert MRSA Assay (FDA approved for NASAL specimens only), is one component of a comprehensive MRSA colonization surveillance program. It is not intended to diagnose MRSA infection nor to guide or monitor treatment for MRSA infections.     Radiology Reports Ct Angio Chest Pe W/cm &/or Wo Cm  05/03/2015   CLINICAL DATA:  Increase shortness of breath of the last 2 days. Dizziness. History of CABG, COPD, AAA repair, diabetes, CHF, CAD, AVM.  EXAM: CT ANGIOGRAPHY CHEST WITH CONTRAST  TECHNIQUE: Multidetector CT imaging of the chest was performed using the standard protocol during bolus administration of intravenous contrast. Multiplanar CT image reconstructions and MIPs were obtained to evaluate the vascular anatomy.  CONTRAST:  36mL OMNIPAQUE IOHEXOL 350 MG/ML SOLN  COMPARISON:  04/23/2015  FINDINGS: Heart: Heart is mildly enlarged. No pericardial effusion. Significant coronary artery disease. Previous median sternotomy and CABG.  Vascular structures: Pulmonary arteries are well opacified. There is no evidence for acute pulmonary embolus. There is atherosclerotic calcification of the thoracic arch not associated with aneurysm or dissection.   Mediastinum/thyroid: The visualized portion of the thyroid  gland has a normal appearance. Small mediastinal lymph nodes are noted, nonspecific in appearance. Large hiatal hernia is present.  Lungs/Airways: Emphysematous changes are present. Large bulla in the left lung apex again noted. No focal consolidations or pleural effusions. There is mild bronchiectasis in the left lung base. No suspicious pulmonary nodules.  Upper abdomen: Large hiatal hernia.  Colonic diverticulosis.  Chest wall/osseous structures: Status post median sternotomy. No suspicious lytic or blastic lesions are identified. Chronic left rib fracture.  Review of the MIP images confirms the above findings.  IMPRESSION: 1. Technically adequate exam showing no pulmonary embolus. 2. Cardiomegaly and significant coronary artery disease. 3. Emphysematous changes.  Bronchiectasis left lung base. 4. Large hiatal hernia. 5. Colonic diverticulosis.   Electronically Signed   By: Nolon Nations M.D.   On: 05/03/2015 09:34   Ct Abdomen Pelvis W Contrast  04/22/2015   CLINICAL DATA:  Generalized abdominal pain and melena.  EXAM: CT ABDOMEN AND PELVIS WITH CONTRAST  TECHNIQUE: Multidetector CT imaging of the abdomen and pelvis was performed using the standard protocol following bolus administration of intravenous contrast.  CONTRAST:  153mL OMNIPAQUE IOHEXOL 300 MG/ML  SOLN  COMPARISON:  12/29/2014  FINDINGS: BODY WALL: Fatty bilateral inguinal hernias with early herniation of small bowel on the right.  LOWER CHEST: Large sliding hiatal hernia without obstruction or inflammatory change.  Mild atelectasis at the left base.  Coronary atherosclerosis.  ABDOMEN/PELVIS:  Liver: No focal abnormality.  Biliary: No evidence of biliary obstruction or stone.  Pancreas: Unremarkable.  Spleen: Unremarkable.  Adrenals: Unremarkable.  Kidneys and ureters: No hydronephrosis or stone. Stable scarring to the lower pole right kidney, likely from covering of a lower pole  accessory renal artery.  Bladder: Unremarkable.  Reproductive: No pathologic findings.  Bowel: Appendectomy. There is colonic diverticulosis, present mainly distally. Large sliding hiatal hernia. No inflammatory bowel wall thickening. A small distal duodenum diverticulum is noted, uncomplicated. No evidence of peptic ulcer disease, aortoenteric fistula or other explanation for melena.  Retroperitoneum: No mass or adenopathy.  Peritoneum: No ascites or pneumoperitoneum.  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 No evidence of major vessel stenosis or occlusion. Small volume gas within the right common femoral vein is presumably from vascular access. There is been right hypogastric artery embolization.  OSSEOUS: No acute abnormalities.  IMPRESSION: 1. No acute finding or explanation for melena. 2. Incidental findings are stable from prior study and noted above.   Electronically Signed   By: Monte Fantasia M.D.   On: 04/22/2015 03:55   Ct Chest High Resolution  04/23/2015   CLINICAL DATA:  74 year old male with history of cough producing blackish phlegm. History of GI bleeding.  EXAM: CT CHEST WITHOUT CONTRAST  TECHNIQUE: Multidetector CT imaging of the chest was performed following the standard protocol without intravenous contrast. High resolution imaging of the lungs, as well as inspiratory and expiratory imaging, was performed.  COMPARISON:  Chest CT 05/15/2013.  FINDINGS: Mediastinum/Lymph Nodes: Heart size is normal. There is no significant pericardial fluid, thickening or pericardial calcification. There is atherosclerosis of the thoracic aorta, the great vessels of the mediastinum and the coronary arteries, including calcified atherosclerotic plaque in the left main, left anterior descending and right coronary arteries. Status post median sternotomy for CABG, including LIMA to the LAD. No pathologically enlarged mediastinal or hilar  lymph nodes. Please note that accurate exclusion of hilar adenopathy is limited on noncontrast CT scans. Large hiatal hernia. No  axillary lymphadenopathy.  Lungs/Pleura: There is some focal architectural distortion, what appears to be chronic volume loss, and some mild peribronchovascular ground-glass attenuation in the left lower lobe, which is favored to reflect a combination of chronic scarring and atelectasis, as well as some peribronchovascular inflammation and mild fibrosis, potentially related to chronic recurrent aspiration in the setting of this large hiatal hernia. High-resolution images otherwise demonstrate no significant additional areas of ground-glass attenuation, subpleural reticulation, parenchymal banding, traction bronchiectasis or frank honeycombing in other portions of the lungs to suggest a background of interstitial lung disease. Inspiratory and expiratory imaging is unremarkable. Mild diffuse bronchial wall thickening with generally mild centrilobular and paraseptal emphysema. There is a large bulla in the apex of the left upper lobe. No acute consolidative airspace disease. No pleural effusions. No suspicious appearing pulmonary nodules or masses.  Upper Abdomen: Unremarkable.  Musculoskeletal/Soft Tissues: Median sternotomy wires. There are no aggressive appearing lytic or blastic lesions noted in the visualized portions of the skeleton.  IMPRESSION: 1. There are no findings to strongly suggest interstitial lung disease on today's examination. 2. There is a spectrum of findings in the left lower lobe, suggestive of some chronic scarring, atelectasis and likely mild fibrosis related to recurrent aspiration or prior infection, as discussed above. 3. Large hiatal hernia. 4. Mild diffuse bronchial wall thickening with mild centrilobular and paraseptal emphysema, as well as a large left apical bulla. 5. Atherosclerosis, including left main and 3 vessel coronary artery disease. Status post median  sternotomy for CABG, including LIMA to the LAD.   Electronically Signed   By: Vinnie Langton M.D.   On: 04/23/2015 13:14   Dg Chest Port 1 View  05/08/2015   CLINICAL DATA:  PICC line placement.  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 Right PICC has been placed and terminates over the lower SVC. Cardiomediastinal silhouette is unchanged, with increased density in the lower mediastinum consistent with known hiatal hernia. Bullous change is again seen in the left lung apex. Mild left basilar opacity remains, likely atelectasis or scarring. There is no evidence of pulmonary edema, sizable pleural effusion, or pneumothorax. No acute osseous abnormality is seen.  IMPRESSION: 1. Right PICC terminates over the lower SVC. 2. No evidence of acute cardiopulmonary process.   Electronically Signed   By: Logan Bores   On: 05/08/2015 11:37   Dg Chest Port 1 View  05/06/2015   CLINICAL DATA:  Shortness of Breath  EXAM: PORTABLE CHEST - 1 VIEW  COMPARISON:  Chest radiograph and chest CT May 02, 2014  FINDINGS: There is a stable large bulla in the left upper lobe. There is flattening of the hemidiaphragms. There is no frank edema or consolidation. The heart is mildly enlarged with pulmonary vascularity within normal limits. There is a sizable hiatal type hernia. Patient is status post coronary artery bypass grafting. No adenopathy.  IMPRESSION: Underlying bullous emphysematous change. No edema or consolidation. Stable cardiac prominence. Sizable hiatal type hernia.   Electronically Signed   By: Lowella Grip III M.D.   On: 05/06/2015 14:54   Dg Chest Portable 1 View  05/03/2015   CLINICAL DATA:  Worsening shortness of breath. History of COPD, chest pain, CHF, diabetes, hypertension.  EXAM: PORTABLE CHEST - 1 VIEW  COMPARISON:  Chest radiograph April 22, 2015 and, CT chest April 23, 2015  FINDINGS: The cardiac silhouette appears moderately enlarged,  status post median sternotomy. Increased lung volumes,  consistent with history of COPD without pleural effusion or focal consolidation. Stable LEFT lung base atelectasis. Apical pleural thickening. No pneumothorax. Soft tissue planes and included osseous structure nonsuspicious.  IMPRESSION: Stable cardiomegaly and COPD.  Stable LEFT lung base atelectasis.   Electronically Signed   By: Elon Alas M.D.   On: 05/03/2015 06:32   Dg Chest Portable 1 View  04/22/2015   CLINICAL DATA:  Acute onset of bilateral chest pain and generalized abdominal pain. Shortness of breath. Initial encounter.  EXAM: PORTABLE CHEST - 1 VIEW  COMPARISON:  Chest radiograph performed 04/10/2015  FINDINGS: The lungs are well-aerated. A small left pleural effusion is noted. Pulmonary vascularity is at the upper limits of normal. Left basilar opacity likely reflects atelectasis. There is no evidence of pneumothorax.  The cardiomediastinal silhouette is mildly enlarged. The patient is status post median sternotomy. No acute osseous abnormalities are seen.  IMPRESSION: Small left pleural effusion noted. Mild cardiomegaly. Persistent mild left basilar airspace opacity likely reflects atelectasis.   Electronically Signed   By: Garald Balding M.D.   On: 04/22/2015 01:19   Dg Chest Port 1 View  04/10/2015   CLINICAL DATA:  Acute onset of cough and difficulty breathing. Initial encounter.  EXAM: PORTABLE CHEST - 1 VIEW  COMPARISON:  Chest radiograph performed 12/04/2013  FINDINGS: The lungs are well-aerated. Mild left basilar opacity likely reflects atelectasis. There is no evidence of pleural effusion or pneumothorax.  The cardiomediastinal silhouette is mildly enlarged. The patient is status post median sternotomy. No acute osseous abnormalities are seen.  IMPRESSION: Mild left basilar opacity likely reflects atelectasis. Mild cardiomegaly.   Electronically Signed   By: Garald Balding M.D.   On: 04/10/2015 05:59     CBC  Recent  Labs Lab 05/04/15 0026 05/06/15 1354 05/06/15 1920 05/07/15 0714 05/08/15 0628  WBC 5.2 14.8* 14.0* 11.0* 13.3*  HGB 7.8* 7.7* 8.6* 7.6* 6.6*  HCT 25.2* 25.2* 27.7* 24.1* 21.0*  PLT 263 379 272 297 225  MCV 86.7 86.9 85.4 85.3 84.0  MCH 27.0 26.5 26.4 27.0 26.4  MCHC 31.2* 30.5* 31.0* 31.7* 31.4*  RDW 17.1* 16.4* 16.5* 17.0* 16.5*  LYMPHSABS  --  2.1  --   --   --   MONOABS  --  1.2*  --   --   --   EOSABS  --  0.0  --   --   --   BASOSABS  --  0.0  --   --   --     Chemistries   Recent Labs Lab 05/03/15 0628 05/04/15 0026 05/06/15 1354 05/07/15 0714 05/08/15 0628  NA 140 140 138 134* 134*  K 3.9 4.1 5.0 4.9 4.9  CL 101 104 102 100* 99*  CO2 33* 30 29 25 24   GLUCOSE 200* 321* 388* 476* 405*  BUN 12 9 34* 43* 36*  CREATININE 0.89 1.01 1.21 1.25* 1.23  CALCIUM 8.3* 7.9* 8.3* 8.1* 8.0*  AST 15  --   --   --   --   ALT 15*  --   --   --   --   ALKPHOS 87  --   --   --   --   BILITOT 0.4  --   --   --   --    ------------------------------------------------------------------------------------------------------------------ estimated creatinine clearance is 72.9 mL/min (by C-G formula based on Cr of 1.23). ------------------------------------------------------------------------------------------------------------------ No results for input(s): HGBA1C in the last 72 hours. ------------------------------------------------------------------------------------------------------------------ No results for input(s): CHOL, HDL,  LDLCALC, TRIG, CHOLHDL, LDLDIRECT in the last 72 hours. ------------------------------------------------------------------------------------------------------------------ No results for input(s): TSH, T4TOTAL, T3FREE, THYROIDAB in the last 72 hours.  Invalid input(s): FREET3 ------------------------------------------------------------------------------------------------------------------ No results for input(s): VITAMINB12, FOLATE, FERRITIN, TIBC, IRON,  RETICCTPCT in the last 72 hours.  Coagulation profile No results for input(s): INR, PROTIME in the last 168 hours.  No results for input(s): DDIMER in the last 72 hours.  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 Plan for EGD later today continue PPI transfuse as needed #2 recurrent AVMs with bleeding GI consult EGD later today #3 acute on chronic COPD exasperation patient was just recently in the hospital with COPD flare and was discharged on steroids and I will continue his IV Solu-Medrol for time being continue his home inhalers and nebulizers. #4  anxiety patient continue Ativan resume his and neck since Celexa #5 hypotension hold blood pressure medications #6 type 2 diabetes mellitus poor control I will start him on high-dose sliding scale insulin hold insulin and long-acting due to nothing by mouth status    Code Status Orders        Start     Ordered   05/06/15 1711  Full code   Continuous     05/06/15 1714           Consults  GI  DVT Prophylaxis SCDs   Lab Results  Component Value Date   PLT 225 05/08/2015     Time Spent in minutes  35  Greater than 50% of time spent in care coordination and counseling with the patient  Dustin Flock M.D on 05/08/2015 at 12:07 PM  Between 7am to 6pm - Pager - [phone]  After 6pm go to [email or web address] - password [email or web address] [facility]   Office  [phone]

## 2015-05-08 NOTE — Care Management (Signed)
Spoke with Edward Qualia at [facility]. Unable to take patient due to not having Psych Nurse and Patient not having PCP. Patient had had an appointment with Dr Ouida Sills but did not follow up per patient. Placed referral to [facility] who is will to provide PCP and extra services including CSW and Pysch nursing . Anticipate discharge with Home Health, PT, RN, [facility]. Awaiting PT evaluation. Faxed referral to Sharmon Revere at [place].

## 2015-05-08 NOTE — Telephone Encounter (Signed)
Yes, dispense #30 0.5mg  tabs for BID prn anxiety, dispense #30, refill 0

## 2015-05-08 NOTE — Progress Notes (Addendum)
Inpatient Diabetes Program Recommendations  AACE/ADA: New Consensus Statement on Inpatient Glycemic Control (2013)  Target Ranges:  Prepandial:   less than 140 mg/dL      Peak postprandial:   less than 180 mg/dL (1-2 hours)      Critically ill patients:  140 - 180 mg/dL   Reason for Visit: insulin teaching  Met with patient to answer his questions.  Instructed patient on the use of both Lantus and Novolog using the insulin pen which he would prefer when he is discharged. 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  The insulin teaching pen kit contains a pamphlet that has written and pictures of the steps in insulin administration/prep.  Used teach back with the patient who was able to administer his own insulin injection with ease.   Reviewed the action of Lantus and Novolog, timing, site rotation, storage and shelf life of insulin.   I have given him the phone number for the [facility] if he should have questions . Reviewed the need for testing blood sugars regularly so MD can make a more accurate assessment of his insulin needs- further instructions regarding how often to check blood sugars will be reviewed at discharge.  He does have a meter at home but indicates he does not know how to use it- encouraged her to call the 1-800 number on the back of the meter for assistance.   Reviewed basis meal planning and to avoid sweetened beverages.  Reviewed A1C and basic physiology of diabetes.   Staff should continue to teach more about diabetes at each interaction. Please allow patient to give himself his own insulin.     , RN, BA, MHA, CDE Diabetes Coordinator Inpatient Diabetes Program  [phone] (Team Pager) [phone] ([facility] Office) 05/08/2015 1:39 PM         

## 2015-05-08 NOTE — Consult Note (Signed)
GI Inpatient Follow-up Note  Patient Identification: GEORGIOS KINA is a 74 y.o. male with GI bleeding. Gastric polyps with ulcers.  Subjective: Getting blood transfusion now with further drop in hgb. NO BM in 2 days. Just started solids. Breathing better but still on 4L O2. Pt requests to have f/u in my office after discharge due to distance from home to Dr. Dorothey Baseman office.  Scheduled Inpatient Medications:  . atorvastatin  40 mg Oral QHS  . budesonide-formoterol  2 puff Inhalation BID  . citalopram  40 mg Oral Daily  . divalproex  500 mg Oral QHS  . ferrous sulfate  650 mg Oral Daily  . folic acid  2 mg Oral q morning - 10a  . insulin aspart  0-20 Units Subcutaneous TID WC  . insulin aspart  10 Units Subcutaneous TID WC  . [START ON 05/09/2015] insulin glargine  17 Units Subcutaneous Daily  . ipratropium-albuterol  3 mL Nebulization Q6H  . living well with diabetes book   Does not apply Once  . metFORMIN  500 mg Oral Daily  . pantoprazole (PROTONIX) IV  40 mg Intravenous Q12H  . [START ON 05/09/2015] predniSONE  60 mg Oral Q breakfast   Followed by  . [START ON 05/10/2015] predniSONE  50 mg Oral Q breakfast   Followed by  . [START ON 05/11/2015] predniSONE  40 mg Oral Q breakfast   Followed by  . [START ON 05/12/2015] predniSONE  30 mg Oral Q breakfast   Followed by  . [START ON 05/13/2015] predniSONE  20 mg Oral Q breakfast   Followed by  . [START ON 05/14/2015] predniSONE  10 mg Oral Q breakfast  . sucralfate  1 g Oral TID WC & HS  . tiotropium  18 mcg Inhalation Daily  . traZODone  50 mg Oral TID  . Vitamin D (Ergocalciferol)  50,000 Units Oral Q7 days    Continuous Inpatient Infusions:   . sodium chloride      PRN Inpatient Medications:  acetaminophen, albuterol, ALPRAZolam, calcium carbonate, zolpidem  Review of Systems: Constitutional: Weight is stable.  Eyes: No changes in vision. ENT: No oral lesions, sore throat.  GI: see HPI.  Heme/Lymph: No easy bruising.   CV: No chest pain.  GU: No hematuria.  Integumentary: No rashes.  Neuro: No headaches.  Psych: No depression/anxiety.  Endocrine: No heat/cold intolerance.  Allergic/Immunologic: No urticaria.  Resp: No cough, SOB.  Musculoskeletal: No joint swelling.    Physical Examination: BP 120/64 mmHg  Pulse 99  Temp(Src) 97.7 F (36.5 C) (Oral)  Resp 17  Ht 6\' 2"  (1.88 m)  Wt 117.845 kg (259 lb 12.8 oz)  BMI 33.34 kg/m2  SpO2 98% Gen: NAD, alert and oriented x 4 HEENT: PEERLA, EOMI, Neck: supple, no JVD or thyromegaly Chest: CTA bilaterally, no wheezes, crackles, or other adventitious sounds CV: RRR, no m/g/c/r Abd: soft, NT, ND, +BS in all four quadrants; no HSM, guarding, ridigity, or rebound tenderness Ext: no edema, well perfused with 2+ pulses, Skin: no rash or lesions noted Lymph: no LAD  Data: Lab Results  Component Value Date   WBC 13.3* 05/08/2015   HGB 6.6* 05/08/2015   HCT 21.0* 05/08/2015   MCV 84.0 05/08/2015   PLT 225 05/08/2015    Recent Labs Lab 05/06/15 1920 05/07/15 0714 05/08/15 0628  HGB 8.6* 7.6* 6.6*   Lab Results  Component Value Date   NA 134* 05/08/2015   K 4.9 05/08/2015   CL 99* 05/08/2015  CO2 24 05/08/2015   BUN 36* 05/08/2015   CREATININE 1.23 05/08/2015   Lab Results  Component Value Date   ALT 15* 05/03/2015   AST 15 05/03/2015   ALKPHOS 87 05/03/2015   BILITOT 0.4 05/03/2015   No results for input(s): APTT, INR, PTT in the last 168 hours. Assessment/Plan: Mr. Ramsay is a 74 y.o. male with gastric polyps with ulcerations.   Recommendations: Continue PPI BID. Continue to moniter hgb. As stated yesterday, not a good candidate for colonoscopy now. thanks Please call with questions or concerns.  Najae Filsaime, Lupita Dawn, MD

## 2015-05-09 LAB — CBC
HCT: 23.3 % — ABNORMAL LOW (ref 40.0–52.0)
Hemoglobin: 7.5 g/dL — ABNORMAL LOW (ref 13.0–18.0)
MCH: 27 pg (ref 26.0–34.0)
MCHC: 32.1 g/dL (ref 32.0–36.0)
MCV: 84.3 fL (ref 80.0–100.0)
PLATELETS: 222 10*3/uL (ref 150–440)
RBC: 2.76 MIL/uL — AB (ref 4.40–5.90)
RDW: 16.5 % — ABNORMAL HIGH (ref 11.5–14.5)
WBC: 9.4 10*3/uL (ref 3.8–10.6)

## 2015-05-09 LAB — TYPE AND SCREEN
ABO/RH(D): B NEG
Antibody Screen: NEGATIVE
UNIT DIVISION: 0
Unit division: 0
Unit division: 0

## 2015-05-09 LAB — BASIC METABOLIC PANEL
ANION GAP: 7 (ref 5–15)
BUN: 39 mg/dL — ABNORMAL HIGH (ref 6–20)
CHLORIDE: 101 mmol/L (ref 101–111)
CO2: 29 mmol/L (ref 22–32)
Calcium: 7.5 mg/dL — ABNORMAL LOW (ref 8.9–10.3)
Creatinine, Ser: 1.22 mg/dL (ref 0.61–1.24)
GFR calc Af Amer: >60 mL/min (ref >60)
GFR calc non Af Amer: 57 mL/min — ABNORMAL LOW (ref >60)
Glucose, Bld: 292 mg/dL — ABNORMAL HIGH (ref 65–99)
Potassium: 4.5 mmol/L (ref 3.5–5.1)
Sodium: 137 mmol/L (ref 135–145)

## 2015-05-09 LAB — GLUCOSE, CAPILLARY
GLUCOSE-CAPILLARY: 310 mg/dL — AB (ref 65–99)
GLUCOSE-CAPILLARY: 396 mg/dL — AB (ref 65–99)
Glucose-Capillary: 282 mg/dL — ABNORMAL HIGH (ref 65–99)
Glucose-Capillary: 244 mg/dL — ABNORMAL HIGH (ref 65–99)

## 2015-05-09 MED ORDER — ALUM & MAG HYDROXIDE-SIMETH 200-200-20 MG/5ML PO SUSP
30.0000 mL | Freq: Four times a day (QID) | ORAL | Status: DC | PRN
  Administered 2015-05-09: 30 mL via ORAL
  Filled 2015-05-09: qty 30

## 2015-05-09 MED ORDER — SODIUM CHLORIDE 0.9 % IJ SOLN
10.0000 mL | Freq: Two times a day (BID) | INTRAMUSCULAR | Status: DC
  Administered 2015-05-09 – 2015-05-11 (×5): 10 mL via INTRAVENOUS

## 2015-05-09 MED ORDER — FUROSEMIDE 10 MG/ML IJ SOLN
40.0000 mg | Freq: Once | INTRAMUSCULAR | Status: AC
  Administered 2015-05-09: 40 mg via INTRAVENOUS
  Filled 2015-05-09: qty 4

## 2015-05-09 MED ORDER — MENTHOL 3 MG MT LOZG
1.0000 | LOZENGE | OROMUCOSAL | Status: DC | PRN
  Filled 2015-05-09: qty 9

## 2015-05-09 MED ORDER — INSULIN ASPART 100 UNIT/ML ~~LOC~~ SOLN
10.0000 [IU] | Freq: Once | SUBCUTANEOUS | Status: AC
  Administered 2015-05-09: 10 [IU] via SUBCUTANEOUS
  Filled 2015-05-09: qty 10

## 2015-05-09 MED ORDER — BENZONATATE 100 MG PO CAPS
100.0000 mg | ORAL_CAPSULE | Freq: Three times a day (TID) | ORAL | Status: DC | PRN
  Administered 2015-05-09 – 2015-05-11 (×2): 100 mg via ORAL
  Filled 2015-05-09 (×3): qty 1

## 2015-05-09 NOTE — Progress Notes (Signed)
Patient ID: [name], male   DOB: 09/28/1941, 74 y.o.   MRN: [id number] [facility] PROGRESS NOTE  HPI/Subjective: Patient had a bad night. Coughing all night. Not feeling well at all. States he ate too much once started on regular diet. Having acid reflux. Feeling very weak.  Objective: Filed Vitals:   05/09/15 0731  BP: 114/56  Pulse: 113  Temp: 97.9 F (36.6 C)  Resp: 17    Intake/Output Summary (Last 24 hours) at 05/09/15 1054 Last data filed at 05/09/15 0917  Gross per 24 hour  Intake   2092 ml  Output   2964 ml  Net   -872 ml   Filed Weights   05/06/15 1328 05/07/15 1700  Weight: 118.842 kg (262 lb) 117.845 kg (259 lb 12.8 oz)    ROS: Review of Systems  Constitutional: Negative for fever and chills.  Eyes: Negative for blurred vision.  Respiratory: Positive for cough and shortness of breath. Negative for hemoptysis and sputum production.   Cardiovascular: Negative for chest pain.  Gastrointestinal: Positive for nausea and constipation. Negative for vomiting, abdominal pain and diarrhea.  Genitourinary: Negative for dysuria.  Musculoskeletal: Negative for joint pain.  Neurological: Negative for dizziness and headaches.   Exam: Physical Exam  Constitutional: He is oriented to person, place, and time.  HENT:  Nose: No mucosal edema.  Mouth/Throat: No oropharyngeal exudate or posterior oropharyngeal edema.  Eyes: Conjunctivae, EOM and lids are normal. Pupils are equal, round, and reactive to light.  Neck: No JVD present. Carotid bruit is not present. No edema present. No thyroid mass and no thyromegaly present.  Cardiovascular: S1 normal and S2 normal.  Exam reveals no gallop.   No murmur heard. Pulses:      Dorsalis pedis pulses are 2+ on the right side, and 2+ on the left side.  Respiratory: No respiratory distress. He has no wheezes. He has no rhonchi. He has rales in the right lower field and the left lower field.  GI: Soft. Bowel sounds  are normal. There is no tenderness.  Musculoskeletal:       Right ankle: He exhibits swelling.       Left ankle: He exhibits swelling.  Lymphadenopathy:    He has no cervical adenopathy.  Neurological: He is alert and oriented to person, place, and time. No cranial nerve deficit.  Skin: Skin is warm. No rash noted. Nails show no clubbing.  Psychiatric: He has a normal mood and affect.    Data Reviewed: Basic Metabolic Panel:  Recent Labs Lab 05/04/15 0026 05/06/15 1354 05/07/15 0714 05/08/15 0628 05/09/15 0611  NA 140 138 134* 134* 137  K 4.1 5.0 4.9 4.9 4.5  CL 104 102 100* 99* 101  CO2 30 29 25 24 29   GLUCOSE 321* 388* 476* 405* 292*  BUN 9 34* 43* 36* 39*  CREATININE 1.01 1.21 1.25* 1.23 1.22  CALCIUM 7.9* 8.3* 8.1* 8.0* 7.5*   Liver Function Tests:  Recent Labs Lab 05/03/15 0628  AST 15  ALT 15*  ALKPHOS 87  BILITOT 0.4  PROT 5.7*  ALBUMIN 2.9*   CBC:  Recent Labs Lab 05/06/15 1354 05/06/15 1920 05/07/15 0714 05/08/15 0628 05/09/15 0611  WBC 14.8* 14.0* 11.0* 13.3* 9.4  NEUTROABS 11.5*  --   --   --   --   HGB 7.7* 8.6* 7.6* 6.6* 7.5*  HCT 25.2* 27.7* 24.1* 21.0* 23.3*  MCV 86.9 85.4 85.3 84.0 84.3  PLT 379 272 297 225 222  CBG:  Recent Labs Lab 05/08/15 0732 05/08/15 1114 05/08/15 1610 05/08/15 2121 05/09/15 0729  GLUCAP 385* 384* 380* 304* 310*       Studies: Dg Chest Port 1 View  05/08/2015   CLINICAL DATA:  PICC line placement.  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 Right PICC has been placed and terminates over the lower SVC. Cardiomediastinal silhouette is unchanged, with increased density in the lower mediastinum consistent with known hiatal hernia. Bullous change is again seen in the left lung apex. Mild left basilar opacity remains, likely atelectasis or scarring. There is no evidence of pulmonary edema, sizable pleural effusion, or pneumothorax. No acute  osseous abnormality is seen.  IMPRESSION: 1. Right PICC terminates over the lower SVC. 2. No evidence of acute cardiopulmonary process.   Electronically Signed   By: Logan Bores   On: 05/08/2015 11:37    Scheduled Meds: . atorvastatin  40 mg Oral QHS  . budesonide-formoterol  2 puff Inhalation BID  . citalopram  40 mg Oral Daily  . divalproex  500 mg Oral QHS  . ferrous sulfate  650 mg Oral Daily  . folic acid  2 mg Oral q morning - 10a  . furosemide  40 mg Intravenous Once  . insulin aspart  0-20 Units Subcutaneous TID WC  . insulin aspart  10 Units Subcutaneous TID WC  . insulin glargine  17 Units Subcutaneous Daily  . ipratropium-albuterol  3 mL Nebulization Q6H  . living well with diabetes book   Does not apply Once  . metFORMIN  500 mg Oral Daily  . pantoprazole (PROTONIX) IV  40 mg Intravenous Q12H  . [START ON 05/10/2015] predniSONE  50 mg Oral Q breakfast   Followed by  . [START ON 05/11/2015] predniSONE  40 mg Oral Q breakfast   Followed by  . [START ON 05/12/2015] predniSONE  30 mg Oral Q breakfast   Followed by  . [START ON 05/13/2015] predniSONE  20 mg Oral Q breakfast   Followed by  . [START ON 05/14/2015] predniSONE  10 mg Oral Q breakfast  . sucralfate  1 g Oral TID WC & HS  . tiotropium  18 mcg Inhalation Daily  . traZODone  50 mg Oral TID  . Vitamin D (Ergocalciferol)  50,000 Units Oral Q7 days   Continuous Infusions:   Assessment/Plan:  1. 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 Acute blood loss anemia. Currently no active bleeding. On PPI and Carafate. Patient also on iron. Continue to monitor need for transfusion. Since hemoglobin today is 7.5 we'll try to hold off on transfusion today. May end up needing another transfusion prior to discharge. 2. Hypovolemic shock from a GI bleed- blood pressure on the lower side but stable. 3. Cough- could be a component of fluid overload with blood transfusions. I will give 1 dose of  IV Lasix. 4. Chronic respiratory failure, COPD- patient on a prednisone taper. Continue oxygen supplementation currently on 3 L. 5. Type 2 diabetes with hyperglycemia- continue Lantus and sliding scale. 6. History of coronary artery disease and peripheral vascular disease- no blood thinners with GI bleed. No medications at this point to lower blood pressure. 7. Weakness- physical therapy evaluation 8. Hyperlipidemia unspecified continue atorvastatin.  Code Status:     Code Status Orders        Start     Ordered   05/06/15 1711  Full code  Continuous     05/06/15 1714      Disposition Plan: Likely home with home health.  Consultants:  Gastrointestinal  Time spent: 24 minutes  Loletha Grayer  [facility]

## 2015-05-09 NOTE — Progress Notes (Signed)
PT Cancellation Note  Patient Details Name: [name] MRN: [id number] DOB: May 12, 1941   Cancelled Treatment:    Reason Eval/Treat Not Completed: Fatigue/lethargy limiting ability to participate.  Spoke with nursing about reattempting later today   [name] 05/09/2015, 10:36 AM   [name], PT MS Acute Rehab Dept. Number: [facility] [id number] and [place] [phone]

## 2015-05-09 NOTE — Progress Notes (Signed)
md (dr. Margaretmary Eddy) notified of pt elevated bg level. 1x order of 10units of novolog. Will give and continue to monitor.

## 2015-05-10 LAB — GLUCOSE, CAPILLARY
GLUCOSE-CAPILLARY: 264 mg/dL — AB (ref 65–99)
GLUCOSE-CAPILLARY: 228 mg/dL — AB (ref 65–99)
GLUCOSE-CAPILLARY: 387 mg/dL — AB (ref 65–99)
GLUCOSE-CAPILLARY: 283 mg/dL — AB (ref 65–99)
Glucose-Capillary: 283 mg/dL — ABNORMAL HIGH (ref 65–99)
Glucose-Capillary: 329 mg/dL — ABNORMAL HIGH (ref 65–99)
Glucose-Capillary: 389 mg/dL — ABNORMAL HIGH (ref 65–99)

## 2015-05-10 LAB — HEMOGLOBIN: HEMOGLOBIN: 6.7 g/dL — AB (ref 13.0–18.0)

## 2015-05-10 LAB — PREPARE RBC (CROSSMATCH)

## 2015-05-10 MED ORDER — METHYLPREDNISOLONE SODIUM SUCC 125 MG IJ SOLR
60.0000 mg | Freq: Three times a day (TID) | INTRAMUSCULAR | Status: DC
  Administered 2015-05-10 – 2015-05-11 (×3): 60 mg via INTRAVENOUS
  Filled 2015-05-10 (×3): qty 2

## 2015-05-10 MED ORDER — FUROSEMIDE 10 MG/ML IJ SOLN
40.0000 mg | Freq: Once | INTRAMUSCULAR | Status: AC
  Administered 2015-05-10: 40 mg via INTRAVENOUS
  Filled 2015-05-10: qty 4

## 2015-05-10 MED ORDER — SODIUM CHLORIDE 0.9 % IV SOLN
INTRAVENOUS | Status: DC
  Administered 2015-05-10 – 2015-05-11 (×2): via INTRAVENOUS

## 2015-05-10 MED ORDER — PEG 3350-KCL-NA BICARB-NACL 420 G PO SOLR
4000.0000 mL | Freq: Once | ORAL | Status: AC
  Administered 2015-05-10: 4000 mL via ORAL
  Filled 2015-05-10: qty 4000

## 2015-05-10 MED ORDER — SODIUM CHLORIDE 0.9 % IV SOLN
Freq: Once | INTRAVENOUS | Status: AC
  Administered 2015-05-10: 07:00:00 via INTRAVENOUS

## 2015-05-10 MED ORDER — INSULIN ASPART 100 UNIT/ML ~~LOC~~ SOLN
8.0000 [IU] | Freq: Once | SUBCUTANEOUS | Status: AC
  Administered 2015-05-10: 8 [IU] via SUBCUTANEOUS
  Filled 2015-05-10: qty 8

## 2015-05-10 MED ORDER — BISACODYL 5 MG PO TBEC
10.0000 mg | DELAYED_RELEASE_TABLET | Freq: Once | ORAL | Status: AC
  Administered 2015-05-10: 10 mg via ORAL
  Filled 2015-05-10: qty 2

## 2015-05-10 MED ORDER — DIPHENHYDRAMINE HCL 25 MG PO CAPS
25.0000 mg | ORAL_CAPSULE | Freq: Once | ORAL | Status: AC
  Administered 2015-05-10: 25 mg via ORAL
  Filled 2015-05-10: qty 1

## 2015-05-10 NOTE — Consult Note (Signed)
GI Inpatient Follow-up Note  Patient Identification: [name] is a 74 y.o. male  Subjective: Breathing better after receiving lasix yesterday. Unfortunately, hgb dropped again. Denies seeing gross blood per rectum.  Scheduled Inpatient Medications:  . atorvastatin  40 mg Oral QHS  . budesonide-formoterol  2 puff Inhalation BID  . citalopram  40 mg Oral Daily  . divalproex  500 mg Oral QHS  . ferrous sulfate  650 mg Oral Daily  . folic acid  2 mg Oral q morning - 10a  . insulin aspart  0-20 Units Subcutaneous TID WC  . insulin aspart  10 Units Subcutaneous TID WC  . insulin glargine  17 Units Subcutaneous Daily  . ipratropium-albuterol  3 mL Nebulization Q6H  . living well with diabetes book   Does not apply Once  . metFORMIN  500 mg Oral Daily  . pantoprazole (PROTONIX) IV  40 mg Intravenous Q12H  . [START ON 05/11/2015] predniSONE  40 mg Oral Q breakfast   Followed by  . [START ON 05/12/2015] predniSONE  30 mg Oral Q breakfast   Followed by  . [START ON 05/13/2015] predniSONE  20 mg Oral Q breakfast   Followed by  . [START ON 05/14/2015] predniSONE  10 mg Oral Q breakfast  . sodium chloride  10 mL Intravenous Q12H  . sucralfate  1 g Oral TID WC & HS  . tiotropium  18 mcg Inhalation Daily  . traZODone  50 mg Oral TID  . Vitamin D (Ergocalciferol)  50,000 Units Oral Q7 days    Continuous Inpatient Infusions:     PRN Inpatient Medications:  acetaminophen, albuterol, ALPRAZolam, alum & mag hydroxide-simeth, benzonatate, calcium carbonate, HYDROcodone-acetaminophen, menthol-cetylpyridinium, ondansetron (ZOFRAN) IV, zolpidem  Review of Systems: Constitutional: Weight is stable.  Eyes: No changes in vision. ENT: No oral lesions, sore throat.  GI: see HPI.  Heme/Lymph: No easy bruising.  CV: No chest pain.  GU: No hematuria.  Integumentary: No rashes.  Neuro: No headaches.  Psych: No depression/anxiety.  Endocrine: No heat/cold intolerance.  Allergic/Immunologic:  No urticaria.  Resp: No cough, SOB.  Musculoskeletal: No joint swelling.    Physical Examination: BP 110/52 mmHg  Pulse 118  Temp(Src) 98 F (36.7 C) (Oral)  Resp 16  Ht 6\' 2"  (1.88 m)  Wt 117.845 kg (259 lb 12.8 oz)  BMI 33.34 kg/m2  SpO2 94% Gen: NAD, alert and oriented x 4 HEENT: PEERLA, EOMI, Neck: supple, no JVD or thyromegaly Chest:decreased breath bilaterally, no wheezes, crackles, or other adventitious sounds CV: RRR, no m/g/c/r Abd: soft, NT, ND, +BS in all four quadrants; no HSM, guarding, ridigity, or rebound tenderness Ext: no edema, well perfused with 2+ pulses, Skin: no rash or lesions noted Lymph: no LAD  Data: Lab Results  Component Value Date   WBC 9.4 05/09/2015   HGB 6.7* 05/10/2015   HCT 23.3* 05/09/2015   MCV 84.3 05/09/2015   PLT 222 05/09/2015    Recent Labs Lab 05/08/15 0628 05/09/15 0611 05/10/15 0615  HGB 6.6* 7.5* 6.7*   Lab Results  Component Value Date   NA 137 05/09/2015   K 4.5 05/09/2015   CL 101 05/09/2015   CO2 29 05/09/2015   BUN 39* 05/09/2015   CREATININE 1.22 05/09/2015   Lab Results  Component Value Date   ALT 15* 05/03/2015   AST 15 05/03/2015   ALKPHOS 87 05/03/2015   BILITOT 0.4 05/03/2015   No results for input(s): APTT, INR, PTT in the last 168 hours.  Assessment/Plan: Mr. Meriwether is a 74 y.o. male  With chronic blood loss. Gastric polyps with ulcerations. However, these were not actively bleeding. Pt already on PPI BID. Pt to receive another unit of blood today.  Recommendations: Talked at length about needing to schedule colonscopy. Pt at moderate risk. Since this will be his 6th unit of blood transfusion since admission, need to find other sources of bleeding. Will go ahead with bowel prep today and plan colonoscopy tomorrow afternoon. Make sure pt gets lasix with blood transfusion please. Thanks. Please call with questions or concerns.  Sandi Towe, Lupita Dawn, MD

## 2015-05-10 NOTE — Progress Notes (Signed)
Patient ID: [name], male   DOB: 03-Dec-1940, 74 y.o.   MRN: [id number]  [facility] PROGRESS NOTE  HPI/Subjective: Patient states that he trusts what ever we want to do for him and that he lived a good life. He understands the risks of colonoscopy and possible need for ventilator. But he is going to receive his sixth unit of blood today. The patient's major complaint is cough. Acid reflux that he had yesterday is now resolved.   Objective: Filed Vitals:   05/10/15 0028  BP: 110/52  Pulse: 118  Temp: 98 F (36.7 C)  Resp: 16    Filed Weights   05/06/15 1328 05/07/15 1700  Weight: 118.842 kg (262 lb) 117.845 kg (259 lb 12.8 oz)    ROS: Review of Systems  Constitutional: Negative for fever and chills.  Eyes: Negative for blurred vision.  Respiratory: Positive for cough and shortness of breath. Negative for hemoptysis and sputum production.   Cardiovascular: Negative for chest pain.  Gastrointestinal: Positive for nausea and constipation. Negative for vomiting, abdominal pain and diarrhea.  Genitourinary: Negative for dysuria.  Musculoskeletal: Negative for joint pain.  Neurological: Negative for dizziness and headaches.   Exam: Physical Exam  Constitutional: He is oriented to person, place, and time.  HENT:  Nose: No mucosal edema.  Mouth/Throat: No oropharyngeal exudate or posterior oropharyngeal edema.  Eyes: Conjunctivae, EOM and lids are normal. Pupils are equal, round, and reactive to light.  Neck: No JVD present. Carotid bruit is not present. No edema present. No thyroid mass and no thyromegaly present.  Cardiovascular: S1 normal and S2 normal.  Exam reveals no gallop.   No murmur heard. Pulses:      Dorsalis pedis pulses are 2+ on the right side, and 2+ on the left side.  Respiratory: No respiratory distress. He has no wheezes. He has no rhonchi. He has rales in the right lower field and the left lower field.  GI: Soft. Bowel sounds are normal.  There is no tenderness.  Musculoskeletal:       Right ankle: He exhibits swelling.       Left ankle: He exhibits swelling.  Lymphadenopathy:    He has no cervical adenopathy.  Neurological: He is alert and oriented to person, place, and time. No cranial nerve deficit.  Skin: Skin is warm. No rash noted. Nails show no clubbing.  Psychiatric: He has a normal mood and affect.    Data Reviewed: Basic Metabolic Panel:  Recent Labs Lab 05/04/15 0026 05/06/15 1354 05/07/15 0714 05/08/15 0628 05/09/15 0611  NA 140 138 134* 134* 137  K 4.1 5.0 4.9 4.9 4.5  CL 104 102 100* 99* 101  CO2 30 29 25 24 29   GLUCOSE 321* 388* 476* 405* 292*  BUN 9 34* 43* 36* 39*  CREATININE 1.01 1.21 1.25* 1.23 1.22  CALCIUM 7.9* 8.3* 8.1* 8.0* 7.5*   CBC:  Recent Labs Lab 05/06/15 1354 05/06/15 1920 05/07/15 0714 05/08/15 0628 05/09/15 0611 05/10/15 0615  WBC 14.8* 14.0* 11.0* 13.3* 9.4  --   NEUTROABS 11.5*  --   --   --   --   --   HGB 7.7* 8.6* 7.6* 6.6* 7.5* 6.7*  HCT 25.2* 27.7* 24.1* 21.0* 23.3*  --   MCV 86.9 85.4 85.3 84.0 84.3  --   PLT 379 272 297 225 222  --    CBG:  Recent Labs Lab 05/09/15 1626 05/09/15 2125 05/10/15 0030 05/10/15 0352 05/10/15 0739  GLUCAP 244*  396* 389* 264* 228*       Studies: Dg Chest Port 1 View  05/08/2015   CLINICAL DATA:  PICC line placement.  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 Right PICC has been placed and terminates over the lower SVC. Cardiomediastinal silhouette is unchanged, with increased density in the lower mediastinum consistent with known hiatal hernia. Bullous change is again seen in the left lung apex. Mild left basilar opacity remains, likely atelectasis or scarring. There is no evidence of pulmonary edema, sizable pleural effusion, or pneumothorax. No acute osseous abnormality is seen.  IMPRESSION: 1. Right PICC terminates over the lower SVC. 2. No evidence  of acute cardiopulmonary process.   Electronically Signed   By: Logan Bores   On: 05/08/2015 11:37    Scheduled Meds: . atorvastatin  40 mg Oral QHS  . bisacodyl  10 mg Oral Once  . budesonide-formoterol  2 puff Inhalation BID  . citalopram  40 mg Oral Daily  . divalproex  500 mg Oral QHS  . ferrous sulfate  650 mg Oral Daily  . folic acid  2 mg Oral q morning - 10a  . insulin aspart  0-20 Units Subcutaneous TID WC  . insulin aspart  10 Units Subcutaneous TID WC  . insulin glargine  17 Units Subcutaneous Daily  . ipratropium-albuterol  3 mL Nebulization Q6H  . living well with diabetes book   Does not apply Once  . methylPREDNISolone (SOLU-MEDROL) injection  60 mg Intravenous TID  . pantoprazole (PROTONIX) IV  40 mg Intravenous Q12H  . polyethylene glycol-electrolytes  4,000 mL Oral Once  . sodium chloride  10 mL Intravenous Q12H  . sucralfate  1 g Oral TID WC & HS  . tiotropium  18 mcg Inhalation Daily  . traZODone  50 mg Oral TID  . Vitamin D (Ergocalciferol)  50,000 Units Oral Q7 days   Continuous Infusions: . sodium chloride      Assessment/Plan:  1. 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 Acute blood loss anemia.  On PPI and Carafate. Patient's hemoglobin drifted down to 6.7 and he is going to receive his sixth unit of blood today. Case discussed with Dr. [name] gastroenterology who will plan a colonoscopy for tomorrow. The patient understands the risks and may end up on the ventilator. I will switch the patient's steroids over to Solu-Medrol 60 mg every 8 hours. 2. Hypovolemic shock from a GI bleed- blood pressure on the lower side but stable. 3. Cough- recent chest x-ray negative. I will give Lasix prior to blood. 4. Chronic respiratory failure, COPD- Continue oxygen supplementation currently on 3 L. Stress dose steroids with colonoscopy tomorrow. 5. Type 2 diabetes with hyperglycemia- continue Lantus and sliding scale. 6. History of  coronary artery disease and peripheral vascular disease- no blood thinners with GI bleed. No medications at this point to lower blood pressure. 7. Weakness- physical therapy evaluation 8. Hyperlipidemia unspecified continue atorvastatin.  Code Status:     Code Status Orders        Start     Ordered   05/06/15 1711  Full code   Continuous     05/06/15 1714      Disposition Plan: Likely home with home health.  Consultants:  Gastrointestinal  Time spent: 26 minutes  Loletha Grayer  [facility]

## 2015-05-10 NOTE — Progress Notes (Signed)
PT Cancellation Note  Patient Details Name: [name] MRN: [id number] DOB: 1941/07/28   Cancelled Treatment:    Reason Eval/Treat Not Completed: Medical issues which prohibited therapy (Hgb is 6.7 and not transfused yet).  Check on him later today as time allows.   [name] 05/10/2015, 8:45 AM   [name], PT MS Acute Rehab Dept. Number: [facility] [id number] and [place] [phone]

## 2015-05-11 ENCOUNTER — Inpatient Hospital Stay: Payer: Commercial Managed Care - HMO | Admitting: Anesthesiology

## 2015-05-11 ENCOUNTER — Encounter
Admission: EM | Disposition: A | Discharge status: Home / Self Care | Payer: Self-pay | Source: Home / Self Care | Attending: Internal Medicine

## 2015-05-11 ENCOUNTER — Encounter: Payer: Self-pay | Admitting: Gastroenterology

## 2015-05-11 HISTORY — PX: COLONOSCOPY WITH PROPOFOL: SHX5780

## 2015-05-11 LAB — GLUCOSE, CAPILLARY
GLUCOSE-CAPILLARY: 447 mg/dL — AB (ref 65–99)
GLUCOSE-CAPILLARY: 309 mg/dL — AB (ref 65–99)
Glucose-Capillary: 328 mg/dL — ABNORMAL HIGH (ref 65–99)
Glucose-Capillary: 288 mg/dL — ABNORMAL HIGH (ref 65–99)
Glucose-Capillary: 440 mg/dL — ABNORMAL HIGH (ref 65–99)
Glucose-Capillary: 464 mg/dL — ABNORMAL HIGH (ref 65–99)
Glucose-Capillary: 439 mg/dL — ABNORMAL HIGH (ref 65–99)

## 2015-05-11 LAB — TYPE AND SCREEN
ABO/RH(D): B NEG
Antibody Screen: NEGATIVE
Unit division: 0

## 2015-05-11 LAB — HEMOGLOBIN: HEMOGLOBIN: 7.9 g/dL — AB (ref 13.0–18.0)

## 2015-05-11 SURGERY — COLONOSCOPY WITH PROPOFOL
Anesthesia: General | Laterality: Left

## 2015-05-11 MED ORDER — TRAZODONE HCL 50 MG PO TABS
50.0000 mg | ORAL_TABLET | Freq: Three times a day (TID) | ORAL | Status: DC
  Administered 2015-05-11 – 2015-05-13 (×6): 50 mg via ORAL
  Filled 2015-05-11 (×6): qty 1

## 2015-05-11 MED ORDER — METHYLPREDNISOLONE SODIUM SUCC 125 MG IJ SOLR
60.0000 mg | Freq: Three times a day (TID) | INTRAMUSCULAR | Status: DC
  Administered 2015-05-11: 60 mg via INTRAVENOUS
  Filled 2015-05-11: qty 2

## 2015-05-11 MED ORDER — PROPOFOL INFUSION 10 MG/ML OPTIME
INTRAVENOUS | Status: DC | PRN
  Administered 2015-05-11: 100 ug/kg/min via INTRAVENOUS

## 2015-05-11 MED ORDER — INSULIN GLARGINE 100 UNIT/ML ~~LOC~~ SOLN
17.0000 [IU] | Freq: Every day | SUBCUTANEOUS | Status: DC
  Administered 2015-05-12: 17 [IU] via SUBCUTANEOUS
  Filled 2015-05-11 (×3): qty 0.17

## 2015-05-11 MED ORDER — LIDOCAINE HCL (CARDIAC) 20 MG/ML IV SOLN
INTRAVENOUS | Status: DC | PRN
  Administered 2015-05-11: 50 mg via INTRAVENOUS
  Administered 2015-05-11: 40 mg via INTRAVENOUS

## 2015-05-11 MED ORDER — SUCRALFATE 1 G PO TABS
1.0000 g | ORAL_TABLET | Freq: Three times a day (TID) | ORAL | Status: DC
  Administered 2015-05-11 – 2015-05-13 (×8): 1 g via ORAL
  Filled 2015-05-11 (×8): qty 1

## 2015-05-11 MED ORDER — INSULIN ASPART 100 UNIT/ML ~~LOC~~ SOLN
10.0000 [IU] | Freq: Once | SUBCUTANEOUS | Status: AC
  Administered 2015-05-11: 10 [IU] via SUBCUTANEOUS
  Filled 2015-05-11: qty 10

## 2015-05-11 MED ORDER — INSULIN ASPART 100 UNIT/ML ~~LOC~~ SOLN
10.0000 [IU] | Freq: Three times a day (TID) | SUBCUTANEOUS | Status: DC
  Administered 2015-05-11 – 2015-05-12 (×2): 10 [IU] via SUBCUTANEOUS
  Filled 2015-05-11 (×2): qty 10

## 2015-05-11 MED ORDER — SODIUM CHLORIDE 0.9 % IJ SOLN
10.0000 mL | Freq: Two times a day (BID) | INTRAMUSCULAR | Status: DC
  Administered 2015-05-11 – 2015-05-13 (×4): 10 mL via INTRAVENOUS

## 2015-05-11 MED ORDER — INSULIN ASPART 100 UNIT/ML ~~LOC~~ SOLN
0.0000 [IU] | Freq: Three times a day (TID) | SUBCUTANEOUS | Status: DC
  Administered 2015-05-11: 20 [IU] via SUBCUTANEOUS
  Administered 2015-05-12: 4 [IU] via SUBCUTANEOUS
  Administered 2015-05-12: 20 [IU] via SUBCUTANEOUS
  Administered 2015-05-12: 15 [IU] via SUBCUTANEOUS
  Administered 2015-05-13: 7 [IU] via SUBCUTANEOUS
  Filled 2015-05-11: qty 20
  Filled 2015-05-11: qty 4
  Filled 2015-05-11: qty 15
  Filled 2015-05-11: qty 24
  Filled 2015-05-11: qty 7

## 2015-05-11 MED ORDER — ONDANSETRON HCL 4 MG/2ML IJ SOLN: 4.0000 mg | Freq: Four times a day (QID) | INTRAMUSCULAR | Status: DC | PRN

## 2015-05-11 MED ORDER — LIVING WELL WITH DIABETES BOOK
Freq: Once | Status: AC
  Administered 2015-05-11: 17:00:00
  Filled 2015-05-11: qty 1

## 2015-05-11 MED ORDER — ZOLPIDEM TARTRATE 5 MG PO TABS
5.0000 mg | ORAL_TABLET | Freq: Every evening | ORAL | Status: DC | PRN
  Administered 2015-05-11 – 2015-05-12 (×2): 5 mg via ORAL
  Filled 2015-05-11 (×2): qty 1

## 2015-05-11 MED ORDER — MENTHOL 3 MG MT LOZG
1.0000 | LOZENGE | OROMUCOSAL | Status: DC | PRN
  Filled 2015-05-11: qty 9

## 2015-05-11 MED ORDER — IPRATROPIUM-ALBUTEROL 0.5-2.5 (3) MG/3ML IN SOLN
3.0000 mL | Freq: Four times a day (QID) | RESPIRATORY_TRACT | Status: DC
  Administered 2015-05-11 – 2015-05-13 (×6): 3 mL via RESPIRATORY_TRACT
  Filled 2015-05-11 (×8): qty 3

## 2015-05-11 MED ORDER — VITAMIN D (ERGOCALCIFEROL) 1.25 MG (50000 UNIT) PO CAPS: 50000.0000 [IU] | ORAL_CAPSULE | ORAL | Status: DC

## 2015-05-11 MED ORDER — TIOTROPIUM BROMIDE MONOHYDRATE 18 MCG IN CAPS
18.0000 ug | ORAL_CAPSULE | Freq: Every day | RESPIRATORY_TRACT | Status: DC
  Administered 2015-05-12 – 2015-05-13 (×2): 18 ug via RESPIRATORY_TRACT
  Filled 2015-05-11: qty 5

## 2015-05-11 MED ORDER — ALPRAZOLAM 0.5 MG PO TABS
0.5000 mg | ORAL_TABLET | Freq: Once | ORAL | Status: AC
  Administered 2015-05-11: 0.5 mg via ORAL
  Filled 2015-05-11: qty 1

## 2015-05-11 MED ORDER — INSULIN GLARGINE 100 UNIT/ML ~~LOC~~ SOLN
10.0000 [IU] | Freq: Once | SUBCUTANEOUS | Status: AC
  Administered 2015-05-11: 10 [IU] via SUBCUTANEOUS
  Filled 2015-05-11: qty 0.1

## 2015-05-11 MED ORDER — PANTOPRAZOLE SODIUM 40 MG IV SOLR
40.0000 mg | Freq: Two times a day (BID) | INTRAVENOUS | Status: DC
  Administered 2015-05-11 – 2015-05-12 (×2): 40 mg via INTRAVENOUS
  Filled 2015-05-11 (×2): qty 40

## 2015-05-11 MED ORDER — SODIUM CHLORIDE 0.9 % IR SOLN: 1000.0000 mL | Status: DC

## 2015-05-11 MED ORDER — PROPOFOL 10 MG/ML IV BOLUS
INTRAVENOUS | Status: DC | PRN
  Administered 2015-05-11: 50 mg via INTRAVENOUS

## 2015-05-11 NOTE — Op Note (Signed)
[facility] Gastroenterology Patient Name: [name] Procedure Date: 05/11/2015 11:39 AM MRN: [id number] Account #: [id number] Date of Birth: 11-10-1940 Admit Type: Inpatient Age: 74 Room: [facility] ENDO ROOM 4 Gender: Male Note Status: Finalized Procedure:         Colonoscopy Indications:       Heme positive stool, Iron deficiency anemia secondary to                     chronic blood loss Providers:         Lupita Dawn. [name], MD Medicines:         Monitored Anesthesia Care Complications:     No immediate complications. Procedure:         Pre-Anesthesia Assessment:                    - Prior to the procedure, a History and Physical was                     performed, and patient medications, allergies and                     sensitivities were reviewed. The patient's tolerance of                     previous anesthesia was reviewed.                    - The risks and benefits of the procedure and the sedation                     options and risks were discussed with the patient. All                     questions were answered and informed consent was obtained.                    - After reviewing the risks and benefits, the patient was                     deemed in satisfactory condition to undergo the procedure.                    After obtaining informed consent, the colonoscope was                     passed under direct vision. Throughout the procedure, the                     patient's blood pressure, pulse, and oxygen saturations                     were monitored continuously. The Colonoscope was                     introduced through the anus and advanced to the the cecum,                     identified by appendiceal orifice and ileocecal valve. The                     colonoscopy was performed without difficulty. The patient                     tolerated the procedure well. The quality of the  bowel                     preparation was fair. Findings:       Multiple small and large-mouthed diverticula were found in the sigmoid       colon.      A diminutive polyp was found in the cecum. The polyp was sessile. The       polyp was removed with a jumbo cold forceps. Resection and retrieval       were complete.      A diminutive polyp was found in the descending colon. The polyp was       removed with a jumbo cold forceps. Resection and retrieval were complete.      The exam was otherwise without abnormality. Impression:        - Diverticulosis in the sigmoid colon.                    - One diminutive polyp in the cecum. Resected and                     retrieved.                    - One diminutive polyp in the descending colon. Resected                     and retrieved.                    - The examination was otherwise normal. Recommendation:    - Await pathology results.                    - The findings and recommendations were discussed with the                     patient. Procedure Code(s): --- Professional ---                    [phone], Colonoscopy, flexible; with biopsy, single or                     multiple Diagnosis Code(s): --- Professional ---                    D12.0, Benign neoplasm of cecum                    D12.4, Benign neoplasm of descending colon                    R19.5, Other fecal abnormalities                    D50.0, Iron deficiency anemia secondary to blood loss                     (chronic)                    K57.30, Diverticulosis of large intestine without                     perforation or abscess without bleeding CPT copyright 2014 American Medical Association. All rights reserved. The codes documented in this report are preliminary and upon coder review may  be revised to meet current compliance requirements. Hulen Luster, MD 05/11/2015 12:11:38 PM This report has been signed  electronically. Number of Addenda: 0 Note Initiated On: 05/11/2015 11:39 AM Scope Withdrawal Time: 0 hours 10 minutes 20 seconds   Total Procedure Duration: 0 hours 13 minutes 41 seconds       [facility]

## 2015-05-11 NOTE — Op Note (Signed)
Colonoscopy showed no bleeding sources. NO AVM's seen. Two tiny polyps removed. Few tics also seen. Pt has had 2 video capsule studies elsewhere, which were neg. No need to repeat them now. Continue protonix and carafate for now. Regular diet. Continue to moniter CBC. No other GI recommendations. Will sign off. Thanks.

## 2015-05-11 NOTE — Progress Notes (Signed)
PT Cancellation Note  Patient Details Name: [name] MRN: [id number] DOB: Mar 06, 1941   Cancelled Treatment:    Reason Eval/Treat Not Completed: Patient at procedure or test/unavailable. Treatment attempted this a.m.; pt out of room for procedure.    Erline Levine Bishop 05/11/2015, 2:22 PM

## 2015-05-11 NOTE — Progress Notes (Signed)
MD called. FSBS remains elevated. MD order 10 units of regular insulin and 10 of lantus

## 2015-05-11 NOTE — Progress Notes (Signed)
FSBS of 440. Notified MD of critical result. Sliding scale order of 30 units given and will D/C Iv solumedrol. Will recheck shortly

## 2015-05-11 NOTE — Progress Notes (Signed)
MD called, pt blood sugar was 447. Previously gave insulin and Md stated to wait and re check in half an hour

## 2015-05-11 NOTE — Progress Notes (Signed)
Inpatient Diabetes Program Recommendations  AACE/ADA: New Consensus Statement on Inpatient Glycemic Control (2013)  Target Ranges:  Prepandial:   less than 140 mg/dL      Peak postprandial:   less than 180 mg/dL (1-2 hours)      Critically ill patients:  140 - 180 mg/dL   Results for [name], [name] (MRN [id number]) as of 05/11/2015 09:50  Ref. Range 05/10/2015 03:52 05/10/2015 07:39 05/10/2015 11:09 05/10/2015 16:20 05/10/2015 21:02 05/10/2015 23:49 05/11/2015 04:37 05/11/2015 07:50  Glucose-Capillary Latest Ref Range: 65-99 mg/dL 264 (H) 228 (H) 387 (H) 283 (H) 283 (H) 329 (H) 328 (H) 288 (H)   Current orders for Inpatient glycemic control: Lantus 17 units daily, Novolog 0-20 TID with meals, Novolog 10 units TID with meals  Inpatient Diabetes Program Recommendations Insulin - Basal: Patient received a total of Novolog 38 units on 05/10/15 for correction (in addition to Lantus 17 units daily and Novolog 10 units TID meal coverage). Note patient is NPO this morning and fasting glucose is 288 mg/dl.  Please consider increasing Lantus to 25 units daily starting today. Insulin-Correction: Please consider ordering Novolog bedtime correction scale. Insulin - Meal Coverage: Noted patient is NPO and was given Novolog 10 units for meal coverage at 8:58 am and was given Novolog 11 unit for correction at 9:06 this morning. NURSING: please note that meal coverage should not be given when patient is NPO. Added meal coverage parameters within order.  Thanks, Barnie Alderman, RN, MSN, CCRN, CDE Diabetes Coordinator Inpatient Diabetes Program [phone] (Team Pager from [place] to [place]) [phone] ([place] office) [phone] [facility] office) [phone] [facility] office)

## 2015-05-11 NOTE — Care Management Important Message (Signed)
Important Message  Patient Details  Name: [name] MRN: [id number] Date of Birth: 06-09-1941   Medicare Important Message Given:  Yes-third notification given    Juliann Pulse A Allmond 05/11/2015, 10:24 AM

## 2015-05-11 NOTE — Progress Notes (Signed)
PT Cancellation Note  Patient Details Name: TAIWO FISH MRN: [id number] DOB: [birth year]   Cancelled Treatment:    Reason Eval/Treat Not Completed: Patient declined, no reason specified. Treatment attempted x 2. Initially pt with Respiratory Therapy for a treatment due to SOB episode and a feeling as if pt was going to pass out.  Second attempt post breathing treatment, pt notes he is too fatigued post procedure and SOB episode to participate. Re attempt treatment tomorrow.    Erline Levine Bishop 05/11/2015, 2:43 PM

## 2015-05-11 NOTE — Progress Notes (Signed)
Patient ID: [name], male   DOB: 05/07/1941, 74 y.o.   MRN: [id number]  [facility] Physicians PROGRESS NOTE  HPI/Subjective: Patient with numerous complaints today. States he is not feeling well. He drank all day colonoscopy prep last night still not going clear yet. Wondering who ordered the enema. Patient had a poor night of sleep. States his breathing is not the best. Patient speaking in complete sentences without shortness of breath to me and without cough.   Objective: Filed Vitals:   05/11/15 0751  BP: 123/56  Pulse: 98  Temp: 98.3 F (36.8 C)  Resp: 20    Filed Weights   05/06/15 1328 05/07/15 1700  Weight: 118.842 kg (262 lb) 117.845 kg (259 lb 12.8 oz)    ROS: Review of Systems  Constitutional: Negative for fever and chills.  Eyes: Negative for blurred vision.  Respiratory: Positive for cough and shortness of breath. Negative for hemoptysis and sputum production.   Cardiovascular: Negative for chest pain.  Gastrointestinal: Positive for nausea and diarrhea. Negative for vomiting, abdominal pain and constipation.  Genitourinary: Negative for dysuria.  Musculoskeletal: Negative for joint pain.  Neurological: Negative for dizziness and headaches.   Exam: Physical Exam  Constitutional: He is oriented to person, place, and time.  HENT:  Nose: No mucosal edema.  Mouth/Throat: No oropharyngeal exudate or posterior oropharyngeal edema.  Eyes: Conjunctivae, EOM and lids are normal. Pupils are equal, round, and reactive to light.  Neck: No JVD present. Carotid bruit is not present. No edema present. No thyroid mass and no thyromegaly present.  Cardiovascular: S1 normal and S2 normal.  Exam reveals no gallop.   No murmur heard. Pulses:      Dorsalis pedis pulses are 2+ on the right side, and 2+ on the left side.  Respiratory: No respiratory distress. He has no wheezes. He has no rhonchi. He has no rales.  GI: Soft. Bowel sounds are normal. There is no  tenderness.  Musculoskeletal:       Right ankle: He exhibits swelling.       Left ankle: He exhibits swelling.  Lymphadenopathy:    He has no cervical adenopathy.  Neurological: He is alert and oriented to person, place, and time. No cranial nerve deficit.  Skin: Skin is warm. No rash noted. Nails show no clubbing.  Psychiatric: He has a normal mood and affect.    Data Reviewed: Basic Metabolic Panel:  Recent Labs Lab 05/06/15 1354 05/07/15 0714 05/08/15 0628 05/09/15 0611  NA 138 134* 134* 137  K 5.0 4.9 4.9 4.5  CL 102 100* 99* 101  CO2 29 25 24 29   GLUCOSE 388* 476* 405* 292*  BUN 34* 43* 36* 39*  CREATININE 1.21 1.25* 1.23 1.22  CALCIUM 8.3* 8.1* 8.0* 7.5*   CBC:  Recent Labs Lab 05/06/15 1354 05/06/15 1920 05/07/15 0714 05/08/15 0628 05/09/15 0611 05/10/15 0615 05/11/15 0500  WBC 14.8* 14.0* 11.0* 13.3* 9.4  --   --   NEUTROABS 11.5*  --   --   --   --   --   --   HGB 7.7* 8.6* 7.6* 6.6* 7.5* 6.7* 7.9*  HCT 25.2* 27.7* 24.1* 21.0* 23.3*  --   --   MCV 86.9 85.4 85.3 84.0 84.3  --   --   PLT 379 272 297 225 222  --   --    CBG:  Recent Labs Lab 05/10/15 1620 05/10/15 2102 05/10/15 2349 05/11/15 0437 05/11/15 0750  GLUCAP 283* 283* 329*  328* 288*     Scheduled Meds: . atorvastatin  40 mg Oral QHS  . budesonide-formoterol  2 puff Inhalation BID  . citalopram  40 mg Oral Daily  . divalproex  500 mg Oral QHS  . ferrous sulfate  650 mg Oral Daily  . folic acid  2 mg Oral q morning - 10a  . insulin aspart  0-20 Units Subcutaneous TID WC  . insulin aspart  10 Units Subcutaneous TID WC  . insulin glargine  17 Units Subcutaneous Daily  . ipratropium-albuterol  3 mL Nebulization Q6H  . living well with diabetes book   Does not apply Once  . methylPREDNISolone (SOLU-MEDROL) injection  60 mg Intravenous TID  . pantoprazole (PROTONIX) IV  40 mg Intravenous Q12H  . sodium chloride  10 mL Intravenous Q12H  . sucralfate  1 g Oral TID WC & HS  .  tiotropium  18 mcg Inhalation Daily  . traZODone  50 mg Oral TID  . Vitamin D (Ergocalciferol)  50,000 Units Oral Q7 days   Continuous Infusions: . sodium chloride 10 mL/hr at 05/10/15 0945    Assessment/Plan:  1. 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 Acute blood loss anemia.  On PPI and Carafate. Patient is status post 6 units of blood hemoglobin today is 7.9. Dr. [name] gastroenterology will take for colonoscopy today. Patient may need to be intubated for procedure. I put on stress dose steroids yesterday. Lungs currently are clear. No contraindications to procedure at this time. 2. Hypovolemic shock from a GI bleed- blood pressure in the normal range 3. Cough- recent chest x-ray negative. Lungs are clear 4. Chronic respiratory failure, COPD- Continue oxygen supplementation currently on 3 L. Stress dose steroids with colonoscopy tomorrow. 5. Type 2 diabetes with hyperglycemia- continue Lantus and sliding scale. 6. History of coronary artery disease and peripheral vascular disease- no blood thinners with GI bleed. No medications at this point to lower blood pressure. 7. Weakness- physical therapy evaluation 8. Hyperlipidemia unspecified continue atorvastatin.  Code Status:     Code Status Orders        Start     Ordered   05/06/15 1711  Full code   Continuous     05/06/15 1714      Disposition Plan: Likely home with home health.  Consultants:  Gastrointestinal  Time spent: 23 minutes  Loletha Grayer  [facility] Hospitalists

## 2015-05-11 NOTE — Progress Notes (Signed)
Initial Nutrition Assessment       INTERVENTION:   Coordination of care: Await diet progression and will monitor intake  NUTRITION DIAGNOSIS:   Inadequate oral intake related to acute illness as evidenced by NPO status.    GOAL:   Patient will meet greater than or equal to 90% of their needs    MONITOR:    (Energy intake, Digestive system,)  REASON FOR ASSESSMENT:   NPO/Clear Liquid Diet    ASSESSMENT:   Pt admitted wtih GI bleed, hypovolemic shock, colonscopy today  Past Medical History  Diagnosis Date  . Allergic rhinitis, cause unspecified   . Other and unspecified hyperlipidemia   . 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 Unspecified essential hypertension   . 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 Severe bullous disease of LUL. Prolonged intubation after surgeries due to COPD  . 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 Anemia     Recurrent microcytic, presumably GI   . Complication of anesthesia     trouble getting off ventilator  . Memory loss   . Diabetes   . CHF (congestive heart failure)   . CAD (coronary artery disease)   . GI bleed requiring more than 4 units of blood in 24 hours, ICU, or surgery     Hx bleeding gastric polyps, cecal & sigmoid AVMS s/p APC 03/30/14  . AVM (arteriovenous malformation) of colon with hemorrhage   . Multiple gastric polyps   . Diverticulosis   . Essential hypertension 08/18/2009    Qualifier: Diagnosis of  By: Doy Mince LPN, Megan    . Diastolic HF (heart failure) 03/27/2012  . Anxiety state 09/10/2013  . Depression 01/14/2013  . Vitamin D deficiency 08/10/2014  . Leucocytosis 12/04/2013  . Insomnia 08/10/2014  . Bipolar 1 disorder, mixed, moderate 04/16/2015    Current Nutrition: NPO/CL day 5  Food/Nutrition-Related History: pt reports normal  intake prior to admission this am.  Pt with multiple complaints this am   Medications: fesulfate, folic acid, aspart, lantus, solumedrol, tums  Electrolyte/Renal Profile and Glucose Profile:   Recent Labs Lab 05/07/15 0714 05/08/15 0628 05/09/15 0611  NA 134* 134* 137  K 4.9 4.9 4.5  CL 100* 99* 101  CO2 25 24 29   BUN 43* 36* 39*  CREATININE 1.25* 1.23 1.22  CALCIUM 8.1* 8.0* 7.5*  GLUCOSE 476* 405* 292*   Protein Profile: No results for input(s): ALBUMIN in the last 168 hours.   Last BM: diarrhea   Nutrition-Focused Physical Exam Findings:  Unable to complete Nutrition-Focused physical exam at this time.     Weight Change: stable weight per pt     Diet Order:  Diet NPO time specified Except for: Sips with Meds  Skin:   reveiwed   Height:   Ht Readings from Last 1 Encounters:  05/06/15 6\' 2"  (1.88 m)    Weight:   Wt Readings from Last 1 Encounters:  05/07/15 259 lb 12.8 oz (117.845 kg)    Ideal Body Weight:     Wt Readings from Last 10 Encounters:  05/07/15 259 lb 12.8 oz (117.845 kg)  05/04/15 266 lb 1.6 oz (120.702 kg)  04/27/15 260 lb 3.2 oz (118.026 kg)  04/17/15 258 lb (117.028 kg)  04/10/15 258 lb 12.8 oz (117.391 kg)  03/23/15 262 lb (118.842 kg)  03/09/15 262 lb (118.842 kg)  02/13/15 258 lb (117.028 kg)  12/29/14 253 lb (114.76 kg)  12/12/14 256 lb (116.121 kg)    BMI:  Body mass index is 33.34 kg/(m^2).  Estimated Nutritional Needs:   Kcal:  Using IBW of 86kg BEE 1674 kcals (IF 1.0-1.2, AF 1.3) 2172-2611 kcals/d.   Protein:  Using IBW of 86kg  (1.0-1.2 g/kg) 86-103 gm/d  Fluid:  Using IBW of 86kg (30-84ml/kg) 2580-3031ml/d  EDUCATION NEEDS:   No education needs identified at this time  [place]. Zenia Resides, [place], [place] (pager)

## 2015-05-11 NOTE — Anesthesia Procedure Notes (Signed)
Date/Time: 05/11/2015 11:45 AM Performed by: Doreen Salvage Pre-anesthesia Checklist: Patient identified, Emergency Drugs available, Suction available and Patient being monitored Patient Re-evaluated:Patient Re-evaluated prior to inductionOxygen Delivery Method: Nasal cannula

## 2015-05-11 NOTE — Transfer of Care (Signed)
Immediate Anesthesia Transfer of Care Note  Patient: [name]  Procedure(s) Performed: Procedure(s): COLONOSCOPY WITH PROPOFOL (Left)  Patient Location: PACU and Endoscopy Unit  Anesthesia Type:General  Level of Consciousness: sedated  Airway & Oxygen Therapy: Patient Spontanous Breathing and Patient connected to nasal cannula oxygen  Post-op Assessment: Report given to RN and Post -op Vital signs reviewed and stable  Post vital signs: Reviewed and stable  Last Vitals:  Filed Vitals:   05/11/15 1217  BP: 128/73  Pulse: 101  Temp: 36.4 C  Resp: 15    Complications: No apparent anesthesia complications

## 2015-05-11 NOTE — Anesthesia Preprocedure Evaluation (Signed)
Anesthesia Evaluation   Patient awake    Reviewed: Allergy & Precautions  Airway Mallampati: III  TM Distance: >3 FB Neck ROM: Full    Dental  (+) Edentulous Lower, Edentulous Upper   Pulmonary shortness of breath, COPD oxygen dependent, former smoker,    + decreased breath sounds      Cardiovascular hypertension, Pt. on medications and Pt. on home beta blockers + CAD, + Past MI, + Peripheral Vascular Disease and +CHF Rhythm:Irregular     Neuro/Psych Anxiety Depression Bipolar Disorder    GI/Hepatic Neg liver ROS, GERD-  ,  Endo/Other  diabetes, Type 1, Insulin DependentMorbid obesity  Renal/GU      Musculoskeletal negative musculoskeletal ROS (+)   Abdominal (+) + obese,   Peds  Hematology  (+) anemia ,   Anesthesia Other Findings   Reproductive/Obstetrics negative OB ROS                             Anesthesia Physical Anesthesia Plan  ASA: III  Anesthesia Plan: General   Post-op Pain Management:    Induction: Intravenous  Airway Management Planned: Nasal Cannula  Additional Equipment:   Intra-op Plan:   Post-operative Plan:   Informed Consent: I have reviewed the patients History and Physical, chart, labs and discussed the procedure including the risks, benefits and alternatives for the proposed anesthesia with the patient or authorized representative who has indicated his/her understanding and acceptance.     Plan Discussed with:   Anesthesia Plan Comments:         Anesthesia Quick Evaluation

## 2015-05-11 NOTE — Progress Notes (Signed)
Pt refused tap water enema

## 2015-05-12 ENCOUNTER — Encounter: Payer: Self-pay | Admitting: Gastroenterology

## 2015-05-12 LAB — GLUCOSE, CAPILLARY
GLUCOSE-CAPILLARY: 319 mg/dL — AB (ref 65–99)
GLUCOSE-CAPILLARY: 336 mg/dL — AB (ref 65–99)
GLUCOSE-CAPILLARY: 172 mg/dL — AB (ref 65–99)
Glucose-Capillary: 269 mg/dL — ABNORMAL HIGH (ref 65–99)
Glucose-Capillary: 421 mg/dL — ABNORMAL HIGH (ref 65–99)
Glucose-Capillary: 177 mg/dL — ABNORMAL HIGH (ref 65–99)

## 2015-05-12 LAB — GLUCOSE, RANDOM: Glucose, Bld: 315 mg/dL — ABNORMAL HIGH (ref 65–99)

## 2015-05-12 LAB — HEMOGLOBIN: Hemoglobin: 7.7 g/dL — ABNORMAL LOW (ref 13.0–18.0)

## 2015-05-12 LAB — SURGICAL PATHOLOGY

## 2015-05-12 MED ORDER — DARBEPOETIN ALFA 100 MCG/0.5ML IJ SOSY
200.0000 ug | PREFILLED_SYRINGE | Freq: Once | INTRAMUSCULAR | Status: AC
  Administered 2015-05-12: 200 ug via SUBCUTANEOUS
  Filled 2015-05-12: qty 0.4

## 2015-05-12 MED ORDER — INSULIN ASPART 100 UNIT/ML ~~LOC~~ SOLN: 0.0000 [IU] | Freq: Every day | SUBCUTANEOUS | Status: DC

## 2015-05-12 MED ORDER — INSULIN ASPART 100 UNIT/ML ~~LOC~~ SOLN: 0.0000 [IU] | Freq: Three times a day (TID) | SUBCUTANEOUS | Status: DC

## 2015-05-12 MED ORDER — PANTOPRAZOLE SODIUM 40 MG PO TBEC
40.0000 mg | DELAYED_RELEASE_TABLET | Freq: Two times a day (BID) | ORAL | Status: DC
  Administered 2015-05-12 – 2015-05-13 (×2): 40 mg via ORAL
  Filled 2015-05-12 (×2): qty 1

## 2015-05-12 MED ORDER — INSULIN ASPART 100 UNIT/ML ~~LOC~~ SOLN: 0.0000 [IU] | SUBCUTANEOUS | Status: DC

## 2015-05-12 MED ORDER — INSULIN ASPART 100 UNIT/ML ~~LOC~~ SOLN
15.0000 [IU] | Freq: Three times a day (TID) | SUBCUTANEOUS | Status: DC
  Administered 2015-05-12 – 2015-05-13 (×3): 15 [IU] via SUBCUTANEOUS
  Filled 2015-05-12 (×2): qty 15

## 2015-05-12 MED ORDER — INSULIN ASPART 100 UNIT/ML ~~LOC~~ SOLN
4.0000 [IU] | Freq: Once | SUBCUTANEOUS | Status: AC
Start: 2015-05-12 — End: 2015-05-12
  Administered 2015-05-12: 4 [IU] via SUBCUTANEOUS

## 2015-05-12 MED ORDER — INSULIN GLARGINE 100 UNIT/ML ~~LOC~~ SOLN
17.0000 [IU] | Freq: Two times a day (BID) | SUBCUTANEOUS | Status: DC
  Administered 2015-05-12 – 2015-05-13 (×2): 17 [IU] via SUBCUTANEOUS
  Filled 2015-05-12 (×4): qty 0.17

## 2015-05-12 MED ORDER — IRON SUCROSE 20 MG/ML IV SOLN
200.0000 mg | Freq: Once | INTRAVENOUS | Status: AC
  Administered 2015-05-12: 200 mg via INTRAVENOUS
  Filled 2015-05-12: qty 10

## 2015-05-12 MED ORDER — INSULIN ASPART 100 UNIT/ML ~~LOC~~ SOLN
15.0000 [IU] | Freq: Three times a day (TID) | SUBCUTANEOUS | Status: DC
  Filled 2015-05-12: qty 15

## 2015-05-12 NOTE — Progress Notes (Signed)
Inpatient Diabetes Program Recommendations  AACE/ADA: New Consensus Statement on Inpatient Glycemic Control (2013)  Target Ranges:  Prepandial:   less than 140 mg/dL      Peak postprandial:   less than 180 mg/dL (1-2 hours)      Critically ill patients:  140 - 180 mg/dL   Results for MAYCEN, DEGREGORY (MRN [id number]) as of 05/12/2015 12:31  Ref. Range 05/11/2015 04:37 05/11/2015 07:50 05/11/2015 16:19 05/11/2015 17:34 05/11/2015 19:05 05/11/2015 20:11 05/11/2015 22:46 05/12/2015 01:02 05/12/2015 04:42 05/12/2015 07:55 05/12/2015 11:25  Glucose-Capillary Latest Ref Range: 65-99 mg/dL 328 (H) 288 (H) 440 (H) 447 (H) 464 (H) 439 (H) 309 (H) 269 (H) 319 (H) 336 (H) 421 (H)   Current orders for Inpatient glycemic control: Lantus 17 units daily, Novolog 0-20 TID with meals, Novolog 10 units TID with meals  Inpatient Diabetes Program Recommendations Insulin - Basal: Patient received Lantus 17 units on 7/25 at 8:58 am and Lantus 10 units on 7/25 at 21:20. Despite additional dose of Lantus glucose still elevated and fasting 336 mg/dl this morning.  Please consider increasing Lantus to 17 units BID. Correction (SSI): Please ordering Novolog bedtime correction scale. Insulin - Meal Coverage: Please consider increasing meal coverage to Novolog 15 units TID with meals.  Thanks, Barnie Alderman, RN, MSN, CCRN, CDE Diabetes Coordinator Inpatient Diabetes Program [phone] (Team Pager from [place] to [place]) [phone] ([place] office) [phone] [facility] office) [phone] [facility] office)

## 2015-05-12 NOTE — Progress Notes (Signed)
Patient ID: [name], male   DOB: 05-Apr-1941, 74 y.o.   MRN: [id number]  [facility] Physicians PROGRESS NOTE  HPI/Subjective:    Objective: Filed Vitals:   05/12/15 0757  BP: 128/66  Pulse: 97  Temp: 98.1 F (36.7 C)  Resp: 18    Filed Weights   05/06/15 1328 05/07/15 1700 05/11/15 1118  Weight: 118.842 kg (262 lb) 117.845 kg (259 lb 12.8 oz) 118.842 kg (262 lb)    ROS: Review of Systems  Constitutional: Negative for fever and chills.  Eyes: Negative for blurred vision.  Respiratory: Positive for cough and shortness of breath. Negative for hemoptysis and sputum production.   Cardiovascular: Negative for chest pain.  Gastrointestinal: Negative for nausea, vomiting, abdominal pain and constipation.  Genitourinary: Negative for dysuria.  Musculoskeletal: Negative for joint pain.  Neurological: Negative for dizziness and headaches.   Exam: Physical Exam  Constitutional: He is oriented to person, place, and time.  HENT:  Nose: No mucosal edema.  Mouth/Throat: No oropharyngeal exudate or posterior oropharyngeal edema.  Eyes: Conjunctivae, EOM and lids are normal. Pupils are equal, round, and reactive to light.  Neck: No JVD present. Carotid bruit is not present. No edema present. No thyroid mass and no thyromegaly present.  Cardiovascular: S1 normal and S2 normal.  Exam reveals no gallop.   No murmur heard. Pulses:      Dorsalis pedis pulses are 2+ on the right side, and 2+ on the left side.  Respiratory: No respiratory distress. He has no wheezes. He has no rhonchi. He has no rales.  GI: Soft. Bowel sounds are normal. There is no tenderness.  Musculoskeletal:       Right ankle: He exhibits swelling.       Left ankle: He exhibits swelling.  Lymphadenopathy:    He has no cervical adenopathy.  Neurological: He is alert and oriented to person, place, and time. No cranial nerve deficit.  Skin: Skin is warm. No rash noted. Nails show no clubbing.  Psychiatric: He  has a normal mood and affect.    Data Reviewed: Basic Metabolic Panel:  Recent Labs Lab 05/06/15 1354 05/07/15 0714 05/08/15 0628 05/09/15 0611  NA 138 134* 134* 137  K 5.0 4.9 4.9 4.5  CL 102 100* 99* 101  CO2 29 25 24 29   GLUCOSE 388* 476* 405* 292*  BUN 34* 43* 36* 39*  CREATININE 1.21 1.25* 1.23 1.22  CALCIUM 8.3* 8.1* 8.0* 7.5*   CBC:  Recent Labs Lab 05/06/15 1354 05/06/15 1920 05/07/15 0714 05/08/15 0628 05/09/15 0611 05/10/15 0615 05/11/15 0500 05/12/15 0502  WBC 14.8* 14.0* 11.0* 13.3* 9.4  --   --   --   NEUTROABS 11.5*  --   --   --   --   --   --   --   HGB 7.7* 8.6* 7.6* 6.6* 7.5* 6.7* 7.9* 7.7*  HCT 25.2* 27.7* 24.1* 21.0* 23.3*  --   --   --   MCV 86.9 85.4 85.3 84.0 84.3  --   --   --   PLT 379 272 297 225 222  --   --   --    CBG:  Recent Labs Lab 05/11/15 2011 05/11/15 2246 05/12/15 0102 05/12/15 0442 05/12/15 0755  GLUCAP 439* 309* 269* 319* 336*     Scheduled Meds: . atorvastatin  40 mg Oral QHS  . budesonide-formoterol  2 puff Inhalation BID  . citalopram  40 mg Oral Daily  . Darbepoetin Alfa  200 mcg Subcutaneous Once  . divalproex  500 mg Oral QHS  . ferrous sulfate  650 mg Oral Daily  . folic acid  2 mg Oral q morning - 10a  . insulin aspart  0-20 Units Subcutaneous TID WC  . insulin aspart  10 Units Subcutaneous TID WC  . insulin glargine  17 Units Subcutaneous Daily  . ipratropium-albuterol  3 mL Nebulization Q6H  . iron sucrose  200 mg Intravenous Once  . pantoprazole (PROTONIX) IV  40 mg Intravenous Q12H  . sodium chloride  10 mL Intravenous Q12H  . sucralfate  1 g Oral TID WC & HS  . tiotropium  18 mcg Inhalation Daily  . traZODone  50 mg Oral TID  . Vitamin D (Ergocalciferol)  50,000 Units Oral Q7 days    Assessment/Plan:  1. 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 Acute blood loss anemia.  On PPI and Carafate. Patient is status post 6 units of blood hemoglobin today is  7.7. I will give IV venofer today and a shot of aranesp. Check hemoglobin tomorrow. Potential discharge tomorrow 2. Hypovolemic shock from a GI bleed- blood pressure in the normal range 3. Cough- recent chest x-ray negative. Lungs are clear 4. Chronic respiratory failure, COPD- Continue oxygen supplementation currently on 3 L. steroids stopped last night. 5. Type 2 diabetes with hyperglycemia- continue Lantus and sliding scale. 6. History of coronary artery disease and peripheral vascular disease- no blood thinners with GI bleed. No medications at this point to lower blood pressure. 7. Weakness- I told the patient he needs to walk with physical therapy today. 8. Hyperlipidemia unspecified continue atorvastatin.  Code Status:     Code Status Orders        Start     Ordered   05/06/15 1711  Full code   Continuous     05/06/15 1714      Disposition Plan: Likely home with home health, potentially tomorrow.  Consultants:  Gastrointestinal  Time spent: 22 minutes  Loletha Grayer  [facility] Hospitalists

## 2015-05-12 NOTE — Consult Note (Signed)
   [facility] CM Inpatient Consult   05/12/2015  [name] Jul 23, 1941 [id number]   Referral received for [facility] services. Spoke with inpatient RNCM prior to calling patient in the room. Had a very lengthy conversation with [name] via phone. He did not want to discuss [facility] services. He states he is only concerned about whether or not insurance will pay for physical therapy to come back to see him again today. Made him aware that writer was not sure about that. Attempted to re-direct him about [facility] services. However, he was not agreeable. He states he does not want to be confused about what has already been arranged by his inpatient RNCM. Patient is supposed to have home health services provided thru [facility]. Attempted to explain that [facility] will not interfere or replace home health. Nonetheless, he was not agreeable but appreciative of call. Of note, the mere mention of potential rehab, patient adamantly refuses and states he does not want to hear about rehab again.  Marthenia Rolling, MSN-Ed, RN,BSN [facility] Liaison [phone]

## 2015-05-12 NOTE — Anesthesia Postprocedure Evaluation (Signed)
  Anesthesia Post-op Note  Patient: [name]  Procedure(s) Performed: Procedure(s): COLONOSCOPY WITH PROPOFOL (Left)  Anesthesia type:General  Patient location: PACU  Post pain: Pain level controlled  Post assessment: Post-op Vital signs reviewed, Patient's Cardiovascular Status Stable, Respiratory Function Stable, Patent Airway and No signs of Nausea or vomiting  Post vital signs: Reviewed and stable  Last Vitals:  Filed Vitals:   05/12/15 1526  BP: 136/64  Pulse: 86  Temp: 36.9 C  Resp: 18    Level of consciousness: awake, alert  and patient cooperative  Complications: No apparent anesthesia complications

## 2015-05-12 NOTE — Evaluation (Signed)
Physical Therapy Evaluation Patient Details Name: [name] MRN: [id number] DOB: Dec 03, 1940 Today's Date: 05/12/2015   History of Present Illness  74 yo male with GI bleed and CHF, PT to assess rehab and equpiment needs  Clinical Impression  Pt was able to walk short trips with limited help and O2 sats were 98% at rest with pulse 46-76, after gait 97% with pulse 91 and using 2L O2 as in use in his room.  Returned to wall air once visit completed with chair alarm in place.    Follow Up Recommendations Home health PT    Equipment Recommendations  None recommended by PT    Recommendations for Other Services       Precautions / Restrictions Precautions Precautions: Fall Restrictions Weight Bearing Restrictions: No      Mobility  Bed Mobility Overal bed mobility: Modified Independent                Transfers Overall transfer level: Modified independent Equipment used: Rolling walker (2 wheeled);1 person hand held assist Transfers: Sit to/from [facility] Sit to Stand: Modified independent (Device/Increase time) Stand pivot transfers: Supervision (for safety)       General transfer comment: reminded about correct hand placement  Ambulation/Gait Ambulation/Gait assistance: Supervision;Min guard Ambulation Distance (Feet): 80 Feet Assistive device: Rolling walker (2 wheeled) Gait Pattern/deviations: Step-through pattern;Decreased stride length;Wide base of support;Trunk flexed (40 foot trips with O2 and PT supervising for safety) Gait velocity: decreased Gait velocity interpretation: Below normal speed for age/gender General Gait Details: 2L O2  Stairs            Wheelchair Mobility    Modified Rankin (Stroke Patients Only)       Balance Overall balance assessment: Needs assistance Sitting-balance support: Feet supported Sitting balance-Leahy Scale: Good   Postural control: Posterior lean Standing balance support: Bilateral  upper extremity supported Standing balance-Leahy Scale: Fair                               Pertinent Vitals/Pain Pain Assessment: No/denies pain    Home Living Family/patient expects to be discharged to:: Private residence Living Arrangements: Spouse/significant other Available Help at Discharge: Family Type of Home: House Home Access: Stairs to enter Entrance Stairs-Rails: Left Entrance Stairs-Number of Steps: 2 Home Layout: One level Home Equipment: Walker - 2 wheels;Shower seat      Prior Function Level of Independence: Independent with assistive device(s)         Comments: Has a walker that he uses as needed.     Hand Dominance   Dominant Hand: Right    Extremity/Trunk Assessment   Upper Extremity Assessment: Overall WFL for tasks assessed           Lower Extremity Assessment: Generalized weakness      Cervical / Trunk Assessment: Normal  Communication   Communication: HOH;Other (comment) (Has difficulty with focus at times and agitation)  Cognition Arousal/Alertness: Awake/alert Behavior During Therapy: Impulsive;Agitated Overall Cognitive Status: Within Functional Limits for tasks assessed                      General Comments General comments (skin integrity, edema, etc.): Pt is against going to SNF although has never been there.  Has supervisory to cga gait skills and should be fine to be home with wife.    Exercises        Assessment/Plan    PT Assessment Patient  needs continued PT services  PT Diagnosis Difficulty walking;Generalized weakness   PT Problem List Decreased strength;Decreased range of motion;Decreased activity tolerance;Decreased balance;Decreased mobility;Decreased coordination;Cardiopulmonary status limiting activity;Obesity  PT Treatment Interventions DME instruction;Gait training;Stair training;Functional mobility training;Therapeutic activities;Therapeutic exercise;Balance training;Neuromuscular  re-education;Patient/family education   PT Goals (Current goals can be found in the Care Plan section) Acute Rehab PT Goals Patient Stated Goal: "I want to have Lenell Antu as my PT again" PT Goal Formulation: With patient Time For Goal Achievement: 05/26/15 Potential to Achieve Goals: Good    Frequency Min 2X/week   Barriers to discharge Other (comment) (Wife can assist him)      Co-evaluation               End of Session Equipment Utilized During Treatment: Gait belt;Oxygen Activity Tolerance: Patient limited by fatigue;Patient limited by lethargy Patient left: in chair;with call bell/phone within reach;with chair alarm set Nurse Communication: Mobility status         Time: 1010-1050 PT Time Calculation (min) (ACUTE ONLY): 40 min   Charges:   PT Evaluation $Initial PT Evaluation Tier I: 1 Procedure PT Treatments $Gait Training: 8-22 mins   PT G CodesRamond Dial 06-01-2015, 11:39 AM   [name], PT MS Acute Rehab Dept. Number: [facility] [phone] and [place] [phone]

## 2015-05-12 NOTE — Progress Notes (Signed)
1200 blood glucose was 421. Paged Dr. Leslye Peer. Page returned. New orders to give highest amount of novolog on sliding scale which is 20U. Also new orders to give 4U extra of novolog.   Orders to change diet from cardiac to carb controlled.  Order placed for STAT lab blood glucose per protocol.  Will continue to monitor

## 2015-05-12 NOTE — Care Management (Signed)
Spoke with patient again to confirm discharge plan. Patient is high risk for readmission and will need extra home health services.  Explained to patient that I had spoken with [name] at [facility] and that they would not be able to provide services for the patient. Reason cited was lack of PCP and non-compliance. PT was angry and rude and stated that he wanted his old physical therapist [name] who works with [facility]. Reiterated that [facility] had refused to take his case. Listened to patient ventilate for 20 minutes about past medical history and how he had been released by several doctors.  Patient blamed his spouse for making mistakes with his medications and previous doctors for not listening. Discussed care further with patient and came up with a compromise.  Patient agreed to let me set him up with Howard. Ouida [name] as PCP. Informed patient that [facility] was willing to accept his case. Patient agrees to have [facility] provide PCP, [name], until he has completed his first appointment with Howard [name] at which time Howard [name] will be able to write orders. Patient will be opened to [facility] with RN, PT, CSW.   Setup up follow up appointment with Howard [name] for August 2 at [place] appointment slip and pre-appointment paperwork in chart after informing the patient about appointment. Notified unit secretary that this paperwork must be sent with patient at discharge. Contacted Sharmon Revere at  [employer]  an d discussed  plan. Anticipate discharge tomorrow.

## 2015-05-13 ENCOUNTER — Ambulatory Visit: Payer: Commercial Managed Care - HMO | Admitting: Gastroenterology

## 2015-05-13 LAB — HEMOGLOBIN: HEMOGLOBIN: 7.7 g/dL — AB (ref 13.0–18.0)

## 2015-05-13 LAB — GLUCOSE, CAPILLARY
GLUCOSE-CAPILLARY: 269 mg/dL — AB (ref 65–99)
GLUCOSE-CAPILLARY: 301 mg/dL — AB (ref 65–99)
Glucose-Capillary: 203 mg/dL — ABNORMAL HIGH (ref 65–99)

## 2015-05-13 MED ORDER — LISINOPRIL 5 MG PO TABS: 5.0000 mg | ORAL_TABLET | Freq: Every day | ORAL | Status: DC

## 2015-05-13 MED ORDER — INSULIN ASPART 100 UNIT/ML ~~LOC~~ SOLN: 10.0000 [IU] | Freq: Three times a day (TID) | SUBCUTANEOUS | Status: DC

## 2015-05-13 MED ORDER — ZOLPIDEM TARTRATE 5 MG PO TABS: ORAL_TABLET | ORAL | Status: DC

## 2015-05-13 MED ORDER — TRAZODONE HCL 50 MG PO TABS: 50.0000 mg | ORAL_TABLET | Freq: Three times a day (TID) | ORAL | Status: DC

## 2015-05-13 MED ORDER — VITAMIN D (ERGOCALCIFEROL) 1.25 MG (50000 UNIT) PO CAPS: 50000.0000 [IU] | ORAL_CAPSULE | ORAL | Status: DC

## 2015-05-13 MED ORDER — BUDESONIDE-FORMOTEROL FUMARATE 160-4.5 MCG/ACT IN AERO: 2.0000 | INHALATION_SPRAY | Freq: Two times a day (BID) | RESPIRATORY_TRACT | Status: DC

## 2015-05-13 MED ORDER — DIVALPROEX SODIUM 500 MG PO DR TAB: 500.0000 mg | DELAYED_RELEASE_TABLET | Freq: Every day | ORAL | Status: DC

## 2015-05-13 MED ORDER — FOLIC ACID 1 MG PO TABS: 2.0000 mg | ORAL_TABLET | Freq: Every morning | ORAL | Status: DC

## 2015-05-13 MED ORDER — DOCUSATE SODIUM 100 MG PO CAPS: 100.0000 mg | ORAL_CAPSULE | Freq: Two times a day (BID) | ORAL | Status: DC

## 2015-05-13 MED ORDER — ALPRAZOLAM 0.5 MG PO TABS: 0.5000 mg | ORAL_TABLET | Freq: Three times a day (TID) | ORAL | Status: DC | PRN

## 2015-05-13 MED ORDER — ATORVASTATIN CALCIUM 40 MG PO TABS: 40.0000 mg | ORAL_TABLET | Freq: Every day | ORAL | Status: DC

## 2015-05-13 MED ORDER — FERROUS SULFATE 325 (65 FE) MG PO TABS
650.0000 mg | ORAL_TABLET | Freq: Three times a day (TID) | ORAL | Status: DC
Start: 2015-05-13 — End: 2015-07-27

## 2015-05-13 MED ORDER — ALBUTEROL SULFATE HFA 108 (90 BASE) MCG/ACT IN AERS: 2.0000 | INHALATION_SPRAY | Freq: Four times a day (QID) | RESPIRATORY_TRACT | Status: DC | PRN

## 2015-05-13 MED ORDER — PANTOPRAZOLE SODIUM 40 MG PO TBEC: 40.0000 mg | DELAYED_RELEASE_TABLET | Freq: Two times a day (BID) | ORAL | Status: DC

## 2015-05-13 MED ORDER — SUCRALFATE 1 G PO TABS: 1.0000 g | ORAL_TABLET | Freq: Three times a day (TID) | ORAL | Status: DC

## 2015-05-13 MED ORDER — INSULIN GLARGINE 100 UNIT/ML ~~LOC~~ SOLN: 17.0000 [IU] | Freq: Two times a day (BID) | SUBCUTANEOUS | Status: DC

## 2015-05-13 MED ORDER — TIOTROPIUM BROMIDE MONOHYDRATE 18 MCG IN CAPS: 18.0000 ug | ORAL_CAPSULE | Freq: Every day | RESPIRATORY_TRACT | Status: DC

## 2015-05-13 MED ORDER — CITALOPRAM HYDROBROMIDE 40 MG PO TABS
40.0000 mg | ORAL_TABLET | Freq: Every day | ORAL | Status: DC
Start: 2015-05-13 — End: 2015-07-27

## 2015-05-13 NOTE — Patient Outreach (Signed)
[facility]) Care Management  05/13/2015  LAZAR TIERCE Dec 19, 1940 [id number]   Notification from Marthenia Rolling, RN patient refused [facility] services.  Ronnell Freshwater. [place], [place] [place] Assistant Phone: [phone] Fax: [phone]

## 2015-05-13 NOTE — Progress Notes (Signed)
Pt stable. Pt d/c instructions and education provided. PICC line removed; site clean, dry and intact 30 mins post removal. Prescriptions given. Questions answered. Will be escorted out by staff.

## 2015-05-13 NOTE — Discharge Summary (Signed)
[facility] NAME: [name]    MR#:  [id number]  DATE OF BIRTH:  Mar 20, 1941  DATE OF ADMISSION:  05/06/2015 ADMITTING PHYSICIAN: Epifanio Lesches, MD  DATE OF DISCHARGE: 05/13/2015  PRIMARY CARE PHYSICIAN: Dustin Flock, MD    ADMISSION DIAGNOSIS:  GI (gastrointestinal bleed) [K92.2] Chronic obstructive pulmonary disease with acute exacerbation [J44.1] Anemia, unspecified anemia type [D64.9]  DISCHARGE DIAGNOSIS:  Active Problems:   GI (gastrointestinal bleed)   SECONDARY DIAGNOSIS:   Past Medical History  Diagnosis Date  . Allergic rhinitis, cause unspecified   . Other and unspecified hyperlipidemia   . 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 Unspecified essential hypertension   . 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 Severe bullous disease of LUL. Prolonged intubation after surgeries due to COPD  . 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 Anemia     Recurrent microcytic, presumably GI   . Complication of anesthesia     trouble getting off ventilator  . Memory loss   . Diabetes   . CHF (congestive heart failure)   . CAD (coronary artery disease)   . GI bleed requiring more than 4 units of blood in 24 hours, ICU, or surgery     Hx bleeding gastric polyps, cecal & sigmoid AVMS s/p APC 03/30/14  . AVM (arteriovenous malformation) of colon with hemorrhage   . Multiple gastric polyps   . Diverticulosis   . Essential hypertension 08/18/2009    Qualifier: Diagnosis of  By: Doy Mince LPN, Megan    . Diastolic HF (heart failure) 03/27/2012  . Anxiety state 09/10/2013  . Depression 01/14/2013  . Vitamin D deficiency 08/10/2014  . Leucocytosis 12/04/2013  . Insomnia 08/10/2014  . Bipolar 1 disorder, mixed, moderate 04/16/2015    HOSPITAL  COURSE:   1. Acute hemorrhagic anemia with GI bleed. Angiodysplasias seen in stomach and also gastritis. Patient was transfused 6 units of packed red blood cells. Patient came in hypotensive and blood pressure medications were stopped. Patient's hemoglobin upon discharge 7.7. No active bleeding. I did recommend following up at the cancer center for close monitoring of blood counts. I did give the patient IV then a firm while here and an Aranesp injection. Close clinical monitoring needed to try to prevent further hospitalizations. Patient also had a colonoscopy which was negative for acute bleeding. Patient had an endoscopy. Follow-up with Dr. [name] in 3 weeks. Cancer center 1 week. 2. Hypovolemic shock- blood pressure stable off of blood pressure medications can restart lisinopril as outpatient. This was secondary to blood loss. 3. End-stage COPD on chronic oxygen. Patient was advised wears oxygen 24 7 he states that he wears 4-5 L. Continue inhalers. 4. Nausea this has improved 5. Anxiety depression and insomnia continue psychiatric medications 6. History of heart failure- no signs of heart failure during this hospitalization. He received Lasix after blood transfusions. 7. History of coronary artery disease- limited with medications secondary to GI bleed.  DISCHARGE CONDITIONS:   Satisfactory  CONSULTS OBTAINED:  Dr. [name] gastroenterology DRUG ALLERGIES:   Allergies  Allergen Reactions  . Penicillins Anaphylaxis and Hives  . Demerol [Meperidine] Other (See Comments)    Reaction:  Hallucinations    . Dilaudid [Hydromorphone Hcl] Other (See Comments)    Reaction:  Hallucinations  DISCHARGE MEDICATIONS:   Current Discharge Medication List    CONTINUE these medications which have CHANGED   Details  albuterol (PROVENTIL HFA;VENTOLIN HFA) 108 (90 BASE) MCG/ACT inhaler Inhale 2 puffs into the lungs every 6 (six) hours as needed for wheezing or shortness of breath. Qty: 1 Inhaler, Refills:  2    ALPRAZolam (XANAX) 0.5 MG tablet Take 1 tablet (0.5 mg total) by mouth 3 (three) times daily as needed for anxiety (for shortness of breath). Qty: 60 tablet, Refills: 0    atorvastatin (LIPITOR) 40 MG tablet Take 1 tablet (40 mg total) by mouth at bedtime. <please make appointment for refills> Qty: 30 tablet, Refills: 0    budesonide-formoterol (SYMBICORT) 160-4.5 MCG/ACT inhaler Inhale 2 puffs into the lungs 2 (two) times daily. Qty: 1 Inhaler, Refills: 2    citalopram (CELEXA) 40 MG tablet Take 1 tablet (40 mg total) by mouth daily. Qty: 30 tablet, Refills: 0    divalproex (DEPAKOTE) 500 MG DR tablet Take 1 tablet (500 mg total) by mouth at bedtime. Qty: 30 tablet, Refills: 0    docusate sodium (COLACE) 100 MG capsule Take 1 capsule (100 mg total) by mouth 2 (two) times daily. Qty: 60 capsule, Refills: 0    ferrous sulfate 325 (65 FE) MG tablet Take 2 tablets (650 mg total) by mouth 3 (three) times daily with meals. Qty: 90 tablet, Refills: 0    folic acid (FOLVITE) 1 MG tablet Take 2 tablets (2 mg total) by mouth every morning. Need appointment before anymore refills Qty: 60 tablet, Refills: 0    insulin aspart (NOVOLOG) 100 UNIT/ML injection Inject 10 Units into the skin 3 (three) times daily with meals. Qty: 10 mL, Refills: 11    insulin glargine (LANTUS) 100 UNIT/ML injection Inject 0.17 mLs (17 Units total) into the skin 2 (two) times daily. Qty: 10 mL, Refills: 11    lisinopril (PRINIVIL,ZESTRIL) 5 MG tablet Take 1 tablet (5 mg total) by mouth daily. Qty: 30 tablet, Refills: 0    pantoprazole (PROTONIX) 40 MG tablet Take 1 tablet (40 mg total) by mouth 2 (two) times daily. Qty: 60 tablet, Refills: 0    sucralfate (CARAFATE) 1 G tablet Take 1 tablet (1 g total) by mouth 4 (four) times daily -  with meals and at bedtime. Qty: 120 tablet, Refills: 0    tiotropium (SPIRIVA) 18 MCG inhalation capsule Place 1 capsule (18 mcg total) into inhaler and inhale daily. Qty:  30 capsule, Refills: 0    traZODone (DESYREL) 50 MG tablet Take 1 tablet (50 mg total) by mouth 3 (three) times daily. Qty: 90 tablet, Refills: 0    Vitamin D, Ergocalciferol, (DRISDOL) 50000 UNITS CAPS capsule Take 1 capsule (50,000 Units total) by mouth every 7 (seven) days. Pt takes on Monday. Qty: 12 capsule, Refills: 0    zolpidem (AMBIEN) 5 MG tablet TAKE 1 TABLET BY MOUTH AT BEDTIME AS NEEDED FOR SLEEP Qty: 30 tablet, Refills: 0      CONTINUE these medications which have NOT CHANGED   Details  acetaminophen (TYLENOL) 325 MG tablet Take 2 tablets (650 mg total) by mouth every 6 (six) hours as needed for mild pain (or Fever >/= 101).      STOP taking these medications     bisacodyl (DULCOLAX) 5 MG EC tablet      Calcium Carbonate Antacid (ROLAIDS EXTRA STRENGTH PO)      metFORMIN (GLUCOPHAGE) 500 MG tablet      metoprolol tartrate (LOPRESSOR) 25 MG  tablet          DISCHARGE INSTRUCTIONS:   Follow-up with Dr. Romilda Garret from home health until follows up with Dr. Ouida Sills PCP Follow-up with Dr. [name] gastroenterology 3 weeks Follow-up with Dr. Grayland Ormond at the cancer center 1 week.  If you experience worsening of your admission symptoms, develop shortness of breath, life threatening emergency, suicidal or homicidal thoughts you must seek medical attention immediately by calling 911 or calling your MD immediately  if symptoms less severe.  You Must read complete instructions/literature along with all the possible adverse reactions/side effects for all the Medicines you take and that have been prescribed to you. Take any new Medicines after you have completely understood and accept all the possible adverse reactions/side effects.   Please note  You were cared for by a hospitalist during your hospital stay. If you have any questions about your discharge medications or the care you received while you were in the hospital after you are discharged, you can call the unit and asked to  speak with the hospitalist on call if the hospitalist that took care of you is not available. Once you are discharged, your primary care physician will handle any further medical issues. Please note that NO REFILLS for any discharge medications will be authorized once you are discharged, as it is imperative that you return to your primary care physician (or establish a relationship with a primary care physician if you do not have one) for your aftercare needs so that they can reassess your need for medications and monitor your lab values.    Today   CHIEF COMPLAINT:   Chief Complaint  Patient presents with  . Shortness of Breath    HISTORY OF PRESENT ILLNESS:  [name]  is a 74 y.o. male with a known history of multiple medical issues presented with bleeding found to have anemia and hypovolemic shock.   VITAL SIGNS:  Blood pressure 128/71, pulse 106, temperature 97.7 F (36.5 C), temperature source Oral, resp. rate 17, height 6\' 2"  (1.88 m), weight 118.842 kg (262 lb), SpO2 93 %.    PHYSICAL EXAMINATION:  GENERAL:  74 y.o.-year-old patient lying in the bed with no acute distress.  EYES: Pupils equal, round, reactive to light and accommodation. No scleral icterus. Extraocular muscles intact.  HEENT: Head atraumatic, normocephalic. Oropharynx and nasopharynx clear.  NECK:  Supple, no jugular venous distention. No thyroid enlargement, no tenderness.  LUNGS: Normal breath sounds bilaterally, no wheezing, rales,rhonchi or crepitation. No use of accessory muscles of respiration.  CARDIOVASCULAR: S1, S2 normal. No murmurs, rubs, or gallops.  ABDOMEN: Soft, non-tender, non-distended. Bowel sounds present. No organomegaly or mass.  EXTREMITIES: Trace edema, no cyanosis, or clubbing.  NEUROLOGIC: Cranial nerves II through XII are intact. Muscle strength 5/5 in all extremities. Sensation intact. Gait not checked.  PSYCHIATRIC: The patient is alert and oriented x 3.  SKIN: No obvious  rash, lesion, or ulcer.   DATA REVIEW:   CBC  Recent Labs Lab 05/09/15 0611  05/13/15 0624  WBC 9.4  --   --   HGB 7.5*  < > 7.7*  HCT 23.3*  --   --   PLT 222  --   --   < > = values in this interval not displayed.  Chemistries   Recent Labs Lab 05/09/15 0611 05/12/15 1346  NA 137  --   K 4.5  --   CL 101  --   CO2 29  --   GLUCOSE 292*  315*  BUN 39*  --   CREATININE 1.22  --   CALCIUM 7.5*  --     Cardiac Enzymes  Recent Labs Lab 05/06/15 1354  TROPONINI <0.03    Management plans discussed with the patient, family and they are in agreement.  CODE STATUS:     Code Status Orders        Start     Ordered   05/06/15 1711  Full code   Continuous     05/06/15 1714      TOTAL TIME TAKING CARE OF THIS PATIENT: 45 minutes.    Loletha Grayer M.D on 05/13/2015 at 10:15 AM  Between 7am to 6pm - Pager - [phone]  After 6pm go to [email or web address] - password [email or web address] [facility]  [place] [facility]  Office  [phone]  CC: Primary care physician; to be Dr. Frazier Richards. Dr. [name] gastroenterology

## 2015-05-13 NOTE — Progress Notes (Signed)
Physical Therapy Treatment Patient Details Name: [name] MRN: [id number] DOB: [birth year] Today's Date: 05/13/2015    History of Present Illness 74 yo male with GI bleed and CHF, PT to assess rehab and equpiment needs    PT Comments    Pt was seen for evaluation of his ability to walk on O2 and if more stable, and noted only a brief time LLE was less stable but pt could completely control it on walker with O2.  Sat pregait was 100% with pulse 110 and post was 97% with pulse 116 and began declining immediately.  Follow Up Recommendations  Home health PT     Equipment Recommendations  None recommended by PT    Recommendations for Other Services       Precautions / Restrictions Precautions Precautions: Fall Precaution Comments: refused bed alarm and informed nursing Restrictions Weight Bearing Restrictions: No    Mobility  Bed Mobility Overal bed mobility: Modified Independent                Transfers Overall transfer level: Modified independent Equipment used: Rolling walker (2 wheeled);1 person hand held assist Transfers: Sit to/from [facility] Sit to Stand: Modified independent (Device/Increase time) Stand pivot transfers: Supervision (for safety)          Ambulation/Gait Ambulation/Gait assistance: Min guard;Supervision Ambulation Distance (Feet): 120 Feet Assistive device: Rolling walker (2 wheeled) Gait Pattern/deviations: Step-through pattern;Decreased stride length;Wide base of support Gait velocity: decreased Gait velocity interpretation: Below normal speed for age/gender General Gait Details: 4L O2   Stairs            Wheelchair Mobility    Modified Rankin (Stroke Patients Only)       Balance Overall balance assessment: Needs assistance   Sitting balance-Leahy Scale: Good     Standing balance support: Bilateral upper extremity supported Standing balance-Leahy Scale: Fair                       Cognition Arousal/Alertness: Awake/alert Behavior During Therapy: WFL for tasks assessed/performed Overall Cognitive Status: Within Functional Limits for tasks assessed                      Exercises      General Comments General comments (skin integrity, edema, etc.): Pt was on bedside when PT arrived and is sitting safely, no attempts to stand before PT was ready      Pertinent Vitals/Pain Pain Assessment: No/denies pain    Home Living                      Prior Function            PT Goals (current goals can now be found in the care plan section) Acute Rehab PT Goals Patient Stated Goal: To get walking again Progress towards PT goals: Progressing toward goals    Frequency  Min 2X/week    PT Plan Current plan remains appropriate    Co-evaluation             End of Session Equipment Utilized During Treatment: Gait belt;Oxygen Activity Tolerance: Patient limited by fatigue Patient left: in bed;Other (comment);with call bell/phone within reach (sat bedside)     Time: 1040-1107 PT Time Calculation (min) (ACUTE ONLY): 27 min  Charges:  $Gait Training: 8-22 mins $Therapeutic Activity: 8-22 mins                    G  Codes:      [name] 05/13/2015, 11:15 AM   [name], PT MS Acute Rehab Dept. Number: [phone] [phone] and [place] [phone]

## 2015-05-13 NOTE — Care Management Important Message (Signed)
Important Message  Patient Details  Name: [name] MRN: [id number] Date of Birth: [birth year]   Medicare Important Message Given:  Yes-fourth notification given    Juliann Pulse A Allmond 05/13/2015, 11:41 AM

## 2015-05-13 NOTE — Care Management (Signed)
Patient up for discharge today. Faxed discharge instructions, Face to face necessity ,  Home Health orders and discharge summary to Sharmon Revere at [facility].

## 2015-05-13 NOTE — Progress Notes (Signed)
Inpatient Diabetes Program Recommendations  AACE/ADA: New Consensus Statement on Inpatient Glycemic Control (2013)  Target Ranges:  Prepandial:   less than 140 mg/dL      Peak postprandial:   less than 180 mg/dL (1-2 hours)      Critically ill patients:  140 - 180 mg/dL   Results for BLEU, MINERD (MRN [id number]) as of 05/13/2015 09:49  Ref. Range 05/12/2015 07:55 05/12/2015 11:25 05/12/2015 16:49 05/12/2015 21:13 05/13/2015 01:01 05/13/2015 03:32 05/13/2015 07:49  Glucose-Capillary Latest Ref Range: 65-99 mg/dL 336 (H) 421 (H) 177 (H) 172 (H) 269 (H) 301 (H) 203 (H)    Current orders for Inpatient glycemic control: Lantus 17 units BID, Novolog 15 units TID with meals, Novolog 0-20 units TID with meals, Novolog 0-5 units HS  Inpatient Diabetes Program Recommendations Insulin - Basal: Please consider increasing Lantus to 20 units BID.  Note: Glucose has improved since Lantus was increased and meal coverage was started yesterday. Also, steroids have been discontinued. Please consider increasing Lantus to 20 units BID to further improve glycemic control.  Thanks, Barnie Alderman, RN, MSN, CCRN, CDE Diabetes Coordinator Inpatient Diabetes Program [phone] (Team Pager from [place] to [place]) [phone] (AP office) [phone] [facility] office) [phone] [facility] office)

## 2015-05-19 ENCOUNTER — Emergency Department: Payer: Commercial Managed Care - HMO

## 2015-05-19 ENCOUNTER — Inpatient Hospital Stay
Admission: EM | Admit: 2015-05-19 | Discharge: 2015-05-23 | DRG: 189 | Disposition: A | Discharge status: Home / Self Care | Payer: Commercial Managed Care - HMO | Attending: Internal Medicine | Admitting: Internal Medicine

## 2015-05-19 ENCOUNTER — Encounter: Payer: Self-pay | Admitting: Emergency Medicine

## 2015-05-19 ENCOUNTER — Inpatient Hospital Stay: Payer: Commercial Managed Care - HMO

## 2015-05-19 DIAGNOSIS — J9622 Acute and chronic respiratory failure with hypercapnia: Secondary | ICD-10-CM | POA: Diagnosis present

## 2015-05-19 DIAGNOSIS — R079 Chest pain, unspecified: Secondary | ICD-10-CM | POA: Diagnosis present

## 2015-05-19 DIAGNOSIS — G4700 Insomnia, unspecified: Secondary | ICD-10-CM | POA: Diagnosis present

## 2015-05-19 DIAGNOSIS — G9340 Encephalopathy, unspecified: Secondary | ICD-10-CM | POA: Diagnosis present

## 2015-05-19 DIAGNOSIS — Z951 Presence of aortocoronary bypass graft: Secondary | ICD-10-CM | POA: Diagnosis not present

## 2015-05-19 DIAGNOSIS — Z880 Allergy status to penicillin: Secondary | ICD-10-CM

## 2015-05-19 DIAGNOSIS — I10 Essential (primary) hypertension: Secondary | ICD-10-CM | POA: Diagnosis present

## 2015-05-19 DIAGNOSIS — J441 Chronic obstructive pulmonary disease with (acute) exacerbation: Secondary | ICD-10-CM | POA: Diagnosis present

## 2015-05-19 DIAGNOSIS — R000 Tachycardia, unspecified: Secondary | ICD-10-CM | POA: Diagnosis present

## 2015-05-19 DIAGNOSIS — Z794 Long term (current) use of insulin: Secondary | ICD-10-CM | POA: Diagnosis not present

## 2015-05-19 DIAGNOSIS — Z79899 Other long term (current) drug therapy: Secondary | ICD-10-CM

## 2015-05-19 DIAGNOSIS — I252 Old myocardial infarction: Secondary | ICD-10-CM

## 2015-05-19 DIAGNOSIS — E785 Hyperlipidemia, unspecified: Secondary | ICD-10-CM | POA: Diagnosis present

## 2015-05-19 DIAGNOSIS — I5033 Acute on chronic diastolic (congestive) heart failure: Secondary | ICD-10-CM | POA: Diagnosis present

## 2015-05-19 DIAGNOSIS — R0603 Acute respiratory distress: Secondary | ICD-10-CM

## 2015-05-19 DIAGNOSIS — Z87891 Personal history of nicotine dependence: Secondary | ICD-10-CM | POA: Diagnosis not present

## 2015-05-19 DIAGNOSIS — E119 Type 2 diabetes mellitus without complications: Secondary | ICD-10-CM | POA: Diagnosis present

## 2015-05-19 DIAGNOSIS — Z885 Allergy status to narcotic agent status: Secondary | ICD-10-CM

## 2015-05-19 DIAGNOSIS — F419 Anxiety disorder, unspecified: Secondary | ICD-10-CM | POA: Diagnosis present

## 2015-05-19 DIAGNOSIS — D72829 Elevated white blood cell count, unspecified: Secondary | ICD-10-CM | POA: Diagnosis present

## 2015-05-19 DIAGNOSIS — R0902 Hypoxemia: Secondary | ICD-10-CM

## 2015-05-19 DIAGNOSIS — I2510 Atherosclerotic heart disease of native coronary artery without angina pectoris: Secondary | ICD-10-CM | POA: Diagnosis present

## 2015-05-19 DIAGNOSIS — J40 Bronchitis, not specified as acute or chronic: Secondary | ICD-10-CM | POA: Insufficient documentation

## 2015-05-19 DIAGNOSIS — I959 Hypotension, unspecified: Secondary | ICD-10-CM | POA: Diagnosis present

## 2015-05-19 DIAGNOSIS — J9621 Acute and chronic respiratory failure with hypoxia: Secondary | ICD-10-CM | POA: Diagnosis present

## 2015-05-19 DIAGNOSIS — R0789 Other chest pain: Secondary | ICD-10-CM | POA: Diagnosis present

## 2015-05-19 DIAGNOSIS — I209 Angina pectoris, unspecified: Secondary | ICD-10-CM

## 2015-05-19 DIAGNOSIS — E669 Obesity, unspecified: Secondary | ICD-10-CM | POA: Diagnosis present

## 2015-05-19 DIAGNOSIS — F3177 Bipolar disorder, in partial remission, most recent episode mixed: Secondary | ICD-10-CM | POA: Diagnosis not present

## 2015-05-19 DIAGNOSIS — G894 Chronic pain syndrome: Secondary | ICD-10-CM | POA: Diagnosis present

## 2015-05-19 DIAGNOSIS — B370 Candidal stomatitis: Secondary | ICD-10-CM | POA: Diagnosis present

## 2015-05-19 DIAGNOSIS — D649 Anemia, unspecified: Secondary | ICD-10-CM | POA: Diagnosis present

## 2015-05-19 DIAGNOSIS — Z6835 Body mass index (BMI) 35.0-35.9, adult: Secondary | ICD-10-CM | POA: Diagnosis not present

## 2015-05-19 DIAGNOSIS — K219 Gastro-esophageal reflux disease without esophagitis: Secondary | ICD-10-CM | POA: Diagnosis present

## 2015-05-19 DIAGNOSIS — I200 Unstable angina: Secondary | ICD-10-CM | POA: Diagnosis present

## 2015-05-19 LAB — CBC WITH DIFFERENTIAL/PLATELET
BASOS PCT: 0 %
Basophils Absolute: 0 10*3/uL (ref 0–0.1)
EOS PCT: 1 %
Eosinophils Absolute: 0.1 10*3/uL (ref 0–0.7)
HEMATOCRIT: 27.5 % — AB (ref 40.0–52.0)
Hemoglobin: 8.6 g/dL — ABNORMAL LOW (ref 13.0–18.0)
LYMPHS PCT: 4 %
Lymphs Abs: 0.6 10*3/uL — ABNORMAL LOW (ref 1.0–3.6)
MCH: 27.4 pg (ref 26.0–34.0)
MCHC: 31.1 g/dL — AB (ref 32.0–36.0)
MCV: 88 fL (ref 80.0–100.0)
Monocytes Absolute: 1.3 10*3/uL — ABNORMAL HIGH (ref 0.2–1.0)
Monocytes Relative: 9 %
NEUTROS ABS: 12.5 10*3/uL — AB (ref 1.4–6.5)
Neutrophils Relative %: 86 %
Platelets: 157 10*3/uL (ref 150–440)
RBC: 3.13 MIL/uL — ABNORMAL LOW (ref 4.40–5.90)
RDW: 19.6 % — AB (ref 11.5–14.5)
WBC: 14.6 10*3/uL — ABNORMAL HIGH (ref 3.8–10.6)

## 2015-05-19 LAB — GLUCOSE, CAPILLARY
Glucose-Capillary: 192 mg/dL — ABNORMAL HIGH (ref 65–99)
Glucose-Capillary: 200 mg/dL — ABNORMAL HIGH (ref 65–99)

## 2015-05-19 LAB — TROPONIN I
TROPONIN I: 0.04 ng/mL — AB (ref <0.031)
Troponin I: 0.05 ng/mL — ABNORMAL HIGH (ref <0.031)
Troponin I: 0.04 ng/mL — ABNORMAL HIGH (ref <0.031)

## 2015-05-19 LAB — COMPREHENSIVE METABOLIC PANEL
ALT: 12 U/L — ABNORMAL LOW (ref 17–63)
AST: 16 U/L (ref 15–41)
Albumin: 2.9 g/dL — ABNORMAL LOW (ref 3.5–5.0)
Alkaline Phosphatase: 93 U/L (ref 38–126)
Anion gap: 8 (ref 5–15)
BILIRUBIN TOTAL: 0.4 mg/dL (ref 0.3–1.2)
BUN: 6 mg/dL (ref 6–20)
CHLORIDE: 95 mmol/L — AB (ref 101–111)
CO2: 35 mmol/L — ABNORMAL HIGH (ref 22–32)
CREATININE: 0.82 mg/dL (ref 0.61–1.24)
Calcium: 8 mg/dL — ABNORMAL LOW (ref 8.9–10.3)
GFR calc Af Amer: >60 mL/min (ref >60)
GLUCOSE: 176 mg/dL — AB (ref 65–99)
Potassium: 4 mmol/L (ref 3.5–5.1)
Sodium: 138 mmol/L (ref 135–145)
Total Protein: 5.9 g/dL — ABNORMAL LOW (ref 6.5–8.1)

## 2015-05-19 LAB — LACTIC ACID, PLASMA
Lactic Acid, Venous: 1.2 mmol/L (ref 0.5–2.0)
Lactic Acid, Venous: 1.4 mmol/L (ref 0.5–2.0)

## 2015-05-19 LAB — AMMONIA: AMMONIA: 29 umol/L (ref 9–35)

## 2015-05-19 LAB — LIPASE, BLOOD: Lipase: 25 U/L (ref 22–51)

## 2015-05-19 LAB — VALPROIC ACID LEVEL: Valproic Acid Lvl: <10 ug/mL — ABNORMAL LOW (ref 50.0–100.0)

## 2015-05-19 MED ORDER — ALBUTEROL SULFATE HFA 108 (90 BASE) MCG/ACT IN AERS: 2.0000 | INHALATION_SPRAY | Freq: Four times a day (QID) | RESPIRATORY_TRACT | Status: DC | PRN

## 2015-05-19 MED ORDER — LISINOPRIL 5 MG PO TABS
5.0000 mg | ORAL_TABLET | Freq: Every day | ORAL | Status: DC
  Administered 2015-05-19 – 2015-05-20 (×2): 5 mg via ORAL
  Filled 2015-05-19 (×2): qty 1

## 2015-05-19 MED ORDER — TIOTROPIUM BROMIDE MONOHYDRATE 18 MCG IN CAPS
18.0000 ug | ORAL_CAPSULE | Freq: Every day | RESPIRATORY_TRACT | Status: DC
  Administered 2015-05-19 – 2015-05-23 (×5): 18 ug via RESPIRATORY_TRACT
  Filled 2015-05-19: qty 5

## 2015-05-19 MED ORDER — ALBUTEROL SULFATE (2.5 MG/3ML) 0.083% IN NEBU
2.5000 mg | INHALATION_SOLUTION | Freq: Four times a day (QID) | RESPIRATORY_TRACT | Status: DC | PRN
  Administered 2015-05-20: 2.5 mg via RESPIRATORY_TRACT
  Filled 2015-05-19: qty 3

## 2015-05-19 MED ORDER — TRAZODONE HCL 50 MG PO TABS
50.0000 mg | ORAL_TABLET | Freq: Three times a day (TID) | ORAL | Status: DC
  Administered 2015-05-19 – 2015-05-23 (×11): 50 mg via ORAL
  Filled 2015-05-19 (×11): qty 1

## 2015-05-19 MED ORDER — INSULIN ASPART 100 UNIT/ML ~~LOC~~ SOLN
0.0000 [IU] | Freq: Three times a day (TID) | SUBCUTANEOUS | Status: DC
  Administered 2015-05-20 (×3): 1 [IU] via SUBCUTANEOUS
  Administered 2015-05-21 – 2015-05-22 (×3): 2 [IU] via SUBCUTANEOUS
  Administered 2015-05-22: 3 [IU] via SUBCUTANEOUS
  Administered 2015-05-23: 7 [IU] via SUBCUTANEOUS
  Administered 2015-05-23: 3 [IU] via SUBCUTANEOUS
  Filled 2015-05-19 (×2): qty 1
  Filled 2015-05-19: qty 2
  Filled 2015-05-19: qty 7
  Filled 2015-05-19: qty 2
  Filled 2015-05-19 (×2): qty 1
  Filled 2015-05-19: qty 3
  Filled 2015-05-19: qty 2
  Filled 2015-05-19: qty 3

## 2015-05-19 MED ORDER — DOCUSATE SODIUM 100 MG PO CAPS
100.0000 mg | ORAL_CAPSULE | Freq: Two times a day (BID) | ORAL | Status: DC
  Administered 2015-05-19 – 2015-05-23 (×9): 100 mg via ORAL
  Filled 2015-05-19 (×9): qty 1

## 2015-05-19 MED ORDER — ACETAMINOPHEN 325 MG PO TABS
650.0000 mg | ORAL_TABLET | Freq: Four times a day (QID) | ORAL | Status: DC | PRN
  Administered 2015-05-19 – 2015-05-21 (×3): 650 mg via ORAL
  Filled 2015-05-19 (×3): qty 2

## 2015-05-19 MED ORDER — DIVALPROEX SODIUM 500 MG PO DR TAB
500.0000 mg | DELAYED_RELEASE_TABLET | Freq: Every day | ORAL | Status: DC
  Administered 2015-05-19 – 2015-05-22 (×4): 500 mg via ORAL
  Filled 2015-05-19 (×4): qty 1

## 2015-05-19 MED ORDER — SUCRALFATE 1 G PO TABS
1.0000 g | ORAL_TABLET | Freq: Three times a day (TID) | ORAL | Status: DC
  Administered 2015-05-19 – 2015-05-23 (×12): 1 g via ORAL
  Filled 2015-05-19 (×12): qty 1

## 2015-05-19 MED ORDER — NITROGLYCERIN 0.4 MG SL SUBL
0.4000 mg | SUBLINGUAL_TABLET | SUBLINGUAL | Status: AC
  Administered 2015-05-19: 0.4 mg via SUBLINGUAL

## 2015-05-19 MED ORDER — VITAMIN D (ERGOCALCIFEROL) 1.25 MG (50000 UNIT) PO CAPS: 50000.0000 [IU] | ORAL_CAPSULE | ORAL | Status: DC

## 2015-05-19 MED ORDER — NITROGLYCERIN 0.4 MG SL SUBL: 0.4000 mg | SUBLINGUAL_TABLET | SUBLINGUAL | Status: DC | PRN

## 2015-05-19 MED ORDER — ZOLPIDEM TARTRATE 5 MG PO TABS
5.0000 mg | ORAL_TABLET | Freq: Every evening | ORAL | Status: DC | PRN
Start: 2015-05-19 — End: 2015-05-23
  Administered 2015-05-19 – 2015-05-22 (×3): 5 mg via ORAL
  Filled 2015-05-19 (×3): qty 1

## 2015-05-19 MED ORDER — INSULIN GLARGINE 100 UNIT/ML ~~LOC~~ SOLN
17.0000 [IU] | Freq: Two times a day (BID) | SUBCUTANEOUS | Status: DC
  Administered 2015-05-19 – 2015-05-23 (×8): 17 [IU] via SUBCUTANEOUS
  Filled 2015-05-19 (×12): qty 0.17

## 2015-05-19 MED ORDER — ALPRAZOLAM 0.5 MG PO TABS
0.5000 mg | ORAL_TABLET | Freq: Three times a day (TID) | ORAL | Status: DC | PRN
  Administered 2015-05-19 – 2015-05-22 (×4): 0.5 mg via ORAL
  Filled 2015-05-19 (×4): qty 1

## 2015-05-19 MED ORDER — BUDESONIDE-FORMOTEROL FUMARATE 160-4.5 MCG/ACT IN AERO
2.0000 | INHALATION_SPRAY | Freq: Two times a day (BID) | RESPIRATORY_TRACT | Status: DC
  Administered 2015-05-19 – 2015-05-23 (×9): 2 via RESPIRATORY_TRACT
  Filled 2015-05-19: qty 6

## 2015-05-19 MED ORDER — ATORVASTATIN CALCIUM 20 MG PO TABS
40.0000 mg | ORAL_TABLET | Freq: Every day | ORAL | Status: DC
  Administered 2015-05-19 – 2015-05-22 (×4): 40 mg via ORAL
  Filled 2015-05-19 (×4): qty 2

## 2015-05-19 MED ORDER — ONDANSETRON HCL 4 MG/2ML IJ SOLN
4.0000 mg | Freq: Four times a day (QID) | INTRAMUSCULAR | Status: DC | PRN
  Administered 2015-05-20: 4 mg via INTRAVENOUS
  Filled 2015-05-19: qty 2

## 2015-05-19 MED ORDER — NITROGLYCERIN 0.4 MG SL SUBL
SUBLINGUAL_TABLET | SUBLINGUAL | Status: AC
  Administered 2015-05-19: 0.4 mg via SUBLINGUAL
  Filled 2015-05-19: qty 1

## 2015-05-19 MED ORDER — SODIUM CHLORIDE 0.9 % IV BOLUS (SEPSIS)
1000.0000 mL | Freq: Once | INTRAVENOUS | Status: AC
  Administered 2015-05-19: 1000 mL via INTRAVENOUS

## 2015-05-19 MED ORDER — INSULIN ASPART 100 UNIT/ML ~~LOC~~ SOLN
0.0000 [IU] | Freq: Every day | SUBCUTANEOUS | Status: DC
  Administered 2015-05-20: 1 [IU] via SUBCUTANEOUS
  Administered 2015-05-21: 2 [IU] via SUBCUTANEOUS
  Administered 2015-05-22: 5 [IU] via SUBCUTANEOUS
  Filled 2015-05-19: qty 2
  Filled 2015-05-19: qty 5
  Filled 2015-05-19: qty 2

## 2015-05-19 MED ORDER — METOPROLOL TARTRATE 25 MG PO TABS
12.5000 mg | ORAL_TABLET | Freq: Two times a day (BID) | ORAL | Status: DC
  Administered 2015-05-19 (×2): 12.5 mg via ORAL
  Administered 2015-05-20: 25 mg via ORAL
  Administered 2015-05-20: 12.5 mg via ORAL
  Filled 2015-05-19 (×4): qty 1

## 2015-05-19 MED ORDER — FOLIC ACID 1 MG PO TABS
2.0000 mg | ORAL_TABLET | Freq: Every morning | ORAL | Status: DC
  Administered 2015-05-19 – 2015-05-23 (×5): 2 mg via ORAL
  Filled 2015-05-19 (×5): qty 2

## 2015-05-19 MED ORDER — PANTOPRAZOLE SODIUM 40 MG PO TBEC
40.0000 mg | DELAYED_RELEASE_TABLET | Freq: Two times a day (BID) | ORAL | Status: DC
  Administered 2015-05-19 – 2015-05-23 (×9): 40 mg via ORAL
  Filled 2015-05-19 (×9): qty 1

## 2015-05-19 MED ORDER — INSULIN ASPART 100 UNIT/ML ~~LOC~~ SOLN: 10.0000 [IU] | Freq: Three times a day (TID) | SUBCUTANEOUS | Status: DC

## 2015-05-19 MED ORDER — FERROUS SULFATE 325 (65 FE) MG PO TABS
650.0000 mg | ORAL_TABLET | Freq: Three times a day (TID) | ORAL | Status: DC
  Administered 2015-05-19 – 2015-05-23 (×8): 650 mg via ORAL
  Filled 2015-05-19 (×8): qty 2

## 2015-05-19 MED ORDER — CITALOPRAM HYDROBROMIDE 20 MG PO TABS
40.0000 mg | ORAL_TABLET | Freq: Every day | ORAL | Status: DC
  Administered 2015-05-19 – 2015-05-20 (×2): 40 mg via ORAL
  Filled 2015-05-19 (×2): qty 2

## 2015-05-19 NOTE — ED Notes (Signed)
Pt resting in bed with lights dimmed. Bed locked and in lowest position. No acute distress noted.

## 2015-05-19 NOTE — Patient Outreach (Signed)
[name]) Care Management  05/19/2015  [name] 1941-10-09 [id number]   Referral from [facility] Inpatient, 7 admissions in 6 months, Kathie Rhodes, RN assigned to engage patient for [place] Management services.  Ronnell Freshwater. [place], [place] Management [name] Assistant Phone: [phone] Fax: [phone]

## 2015-05-19 NOTE — Progress Notes (Signed)
Informed pt on the plan for Stress test on tomorrow morning. Pt said he has had one before and he will not do it again. Refused to sign consent for procedure.

## 2015-05-19 NOTE — ED Notes (Signed)
RN in and out of room moving pt, Helping pt use urinal, Changing pt after pt urinated on self. Pt in no acute distress at this time.

## 2015-05-19 NOTE — Care Management (Signed)
This is patient's 5th admission this month.  (One admission was beh med).  Last discharge was 8/27.  Patient well known to this CM.  Patient was to have had an appointment with Dr Governor Specking  On 7/19 but his wife "cancelled the appointment."  This care manager spoke with Dr Ouida Sills and physician declined to sign/ follow home health until patient got established with his practice.  It is documented that patient was to have seen Dr Ouida Sills today but did not keep appointment because presented at [facility] with Chest pain.  Pa Dr Alvester Chou is following patient with [facility].   Patient presents today with weakness and shortness of breath.  Initial troponin is .05. All troponins since June 2016 have been .03.  Wonder if patient would benefit from short term skilled nursing to  Improve functional status and achieve a period of medical stability.  Increased home health visits have not prevented admission.

## 2015-05-19 NOTE — ED Notes (Signed)
Pt from home via ems with weakness and chest pain and sob. Pt states he awakened this morning with chest pain and sob. pt has hx of copd and was discharged from hospital 4 days ago with gi bleed. Pt is tachy upon arrival with mottled skin.
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
## 2015-05-19 NOTE — ED Notes (Signed)
Pt to CT at this time. Will be taken to floor after CT scan

## 2015-05-19 NOTE — ED Notes (Addendum)
ED nurse to bedside per pts request via call bell. Pt requests tv channel to be changed and reports pain at this time. RN verbalizes MD will be notified. Pt reports having psych medication that helps him breath easier that he would like as well but is unfamiliar with medication name. Pt informed that pharmacy will look into the medication. Pt began getting irritated with nurse telling him to breath through nose to receive oxygen and stated "If I hear that one more fucking time." RN asked pt not to cuss in hospital. Pt states " I will say whatever the fuck I want to say." RN repeatedly asked pt not to cuss. Pt began yelling at nurse that he was aloud to say what he wanted to. RN left room and pt said "Fuck you" This RN went back in room and told pt not to cuss at healthcare providers.
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
## 2015-05-19 NOTE — Consult Note (Signed)
  Psychiatry: Consult for this 74 year old man with a history of atypical mood symptoms, possible bipolar disorder versus personality disorder. Currently in the hospital for chest pain. I came by to see the patient this evening around 8:30. He was sound asleep. I saw no advantage to trying to wake him up. I am very familiar with this gentleman having worked with him during a previous admission earlier in the summer. It was my impression then that he is a person who is chronically so prone to irritability and dramatic statements that it was difficult to ascertain whether his problem was more like bipolar disorder or like a personality disorder. I will come by tomorrow and try to reassess. No orders written at this time.

## 2015-05-19 NOTE — ED Notes (Signed)
Pt resting in bed. No acute distress noted. Staff in and out of pts room per pts request via call bell to adjust tv and bed.

## 2015-05-20 ENCOUNTER — Other Ambulatory Visit: Payer: Self-pay

## 2015-05-20 DIAGNOSIS — I209 Angina pectoris, unspecified: Secondary | ICD-10-CM | POA: Insufficient documentation

## 2015-05-20 DIAGNOSIS — R0603 Acute respiratory distress: Secondary | ICD-10-CM

## 2015-05-20 DIAGNOSIS — I200 Unstable angina: Secondary | ICD-10-CM

## 2015-05-20 DIAGNOSIS — J40 Bronchitis, not specified as acute or chronic: Secondary | ICD-10-CM | POA: Insufficient documentation

## 2015-05-20 DIAGNOSIS — I5033 Acute on chronic diastolic (congestive) heart failure: Secondary | ICD-10-CM | POA: Insufficient documentation

## 2015-05-20 DIAGNOSIS — R000 Tachycardia, unspecified: Secondary | ICD-10-CM | POA: Insufficient documentation

## 2015-05-20 DIAGNOSIS — J441 Chronic obstructive pulmonary disease with (acute) exacerbation: Secondary | ICD-10-CM

## 2015-05-20 DIAGNOSIS — F3177 Bipolar disorder, in partial remission, most recent episode mixed: Secondary | ICD-10-CM

## 2015-05-20 DIAGNOSIS — J80 Acute respiratory distress syndrome: Secondary | ICD-10-CM

## 2015-05-20 LAB — BASIC METABOLIC PANEL
Anion gap: 10 (ref 5–15)
BUN: 6 mg/dL (ref 6–20)
CHLORIDE: 99 mmol/L — AB (ref 101–111)
CO2: 34 mmol/L — AB (ref 22–32)
Calcium: 8.1 mg/dL — ABNORMAL LOW (ref 8.9–10.3)
Creatinine, Ser: 0.85 mg/dL (ref 0.61–1.24)
GFR calc Af Amer: >60 mL/min (ref >60)
GFR calc non Af Amer: >60 mL/min (ref >60)
Glucose, Bld: 171 mg/dL — ABNORMAL HIGH (ref 65–99)
POTASSIUM: 3.9 mmol/L (ref 3.5–5.1)
SODIUM: 143 mmol/L (ref 135–145)

## 2015-05-20 LAB — CBC
HEMATOCRIT: 26.4 % — AB (ref 40.0–52.0)
Hemoglobin: 8 g/dL — ABNORMAL LOW (ref 13.0–18.0)
MCH: 27 pg (ref 26.0–34.0)
MCHC: 30.5 g/dL — ABNORMAL LOW (ref 32.0–36.0)
MCV: 88.5 fL (ref 80.0–100.0)
PLATELETS: 158 10*3/uL (ref 150–440)
RBC: 2.98 MIL/uL — AB (ref 4.40–5.90)
RDW: 20 % — ABNORMAL HIGH (ref 11.5–14.5)
WBC: 11.1 10*3/uL — AB (ref 3.8–10.6)

## 2015-05-20 LAB — BLOOD GAS, ARTERIAL
ACID-BASE EXCESS: 12.8 mmol/L — AB (ref 0.0–3.0)
Allens test (pass/fail): POSITIVE — AB
BICARBONATE: 42.8 meq/L — AB (ref 21.0–28.0)
FIO2: 0.36
O2 Saturation: 77.8 %
PATIENT TEMPERATURE: 37
pCO2 arterial: 83 mmHg (ref 32.0–48.0)
pH, Arterial: 7.32 — ABNORMAL LOW (ref 7.350–7.450)
pO2, Arterial: 46 mmHg — ABNORMAL LOW (ref 83.0–108.0)

## 2015-05-20 LAB — GLUCOSE, CAPILLARY
GLUCOSE-CAPILLARY: 146 mg/dL — AB (ref 65–99)
Glucose-Capillary: 142 mg/dL — ABNORMAL HIGH (ref 65–99)
Glucose-Capillary: 146 mg/dL — ABNORMAL HIGH (ref 65–99)
Glucose-Capillary: 133 mg/dL — ABNORMAL HIGH (ref 65–99)

## 2015-05-20 LAB — LIPID PANEL
CHOL/HDL RATIO: 2.9 ratio
Cholesterol: 129 mg/dL (ref 0–200)
HDL: 44 mg/dL (ref >40)
LDL Cholesterol: 61 mg/dL (ref 0–99)
Triglycerides: 121 mg/dL (ref <150)
VLDL: 24 mg/dL (ref 0–40)

## 2015-05-20 LAB — HEMOGLOBIN A1C: Hgb A1c MFr Bld: 6.8 % — ABNORMAL HIGH (ref 4.0–6.0)

## 2015-05-20 LAB — TROPONIN I: Troponin I: 0.04 ng/mL — ABNORMAL HIGH (ref <0.031)

## 2015-05-20 MED ORDER — CITALOPRAM HYDROBROMIDE 20 MG PO TABS
20.0000 mg | ORAL_TABLET | Freq: Every day | ORAL | Status: DC
  Administered 2015-05-21 – 2015-05-23 (×3): 20 mg via ORAL
  Filled 2015-05-20 (×3): qty 1

## 2015-05-20 MED ORDER — LEVOFLOXACIN IN D5W 750 MG/150ML IV SOLN
750.0000 mg | INTRAVENOUS | Status: DC
  Administered 2015-05-20 – 2015-05-21 (×2): 750 mg via INTRAVENOUS
  Filled 2015-05-20 (×4): qty 150

## 2015-05-20 MED ORDER — FUROSEMIDE 10 MG/ML IJ SOLN
20.0000 mg | Freq: Two times a day (BID) | INTRAMUSCULAR | Status: DC
  Administered 2015-05-20 (×2): 20 mg via INTRAVENOUS
  Filled 2015-05-20 (×2): qty 2

## 2015-05-20 MED ORDER — NYSTATIN 100000 UNIT/ML MT SUSP
5.0000 mL | Freq: Four times a day (QID) | OROMUCOSAL | Status: DC
  Administered 2015-05-20 – 2015-05-23 (×8): 500000 [IU] via OROMUCOSAL
  Filled 2015-05-20 (×15): qty 5

## 2015-05-20 NOTE — Care Management (Signed)
PC02 is 80.  Patient is being placed on bipapp.  Over night pulse will be ordered to see if patient would dqualify for home cpap.

## 2015-05-20 NOTE — Progress Notes (Signed)
Pastoral Care Update: After interdisciplinary rounds, Chaplain attempted to visit patient and will have to return due to the fact that the patient was sleeping. Silent prayers were offered for comfort and healing.  Pastoral Care: [phone] pager, or submit an online request

## 2015-05-20 NOTE — Progress Notes (Signed)
PT Hold Note  Patient Details Name: HAZEN BRUMETT MRN: [id number] DOB: 02/19/1941   Cancelled Treatment:    Reason Eval/Treat Not Completed: Medical issues which prohibited therapy. Chart reviewed. RN and MD consulted. MD reports that pt is currently lethargic with garbled speech. MD ordering ABG and states that pt is not currently appropriate for PT. Will hold evaluation and attempt on later date/time as pt is appropriate.  Lyndel Safe Diannie Willner PT, DPT   Laurelyn Terrero 05/20/2015, 11:25 AM

## 2015-05-20 NOTE — Consult Note (Signed)
  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 Came by to see the patient today since he was asleep last night. He is currently on a BiPAP mask and really not able to hold a conversation. Patient appears to be at least acutely cooperative with treatment. Not agitated. Not able to do review of systems due to acuity of illness. Patient is awake and cooperative. He indicated to the nurse some degree of confusion area did not do a more complete mental status exam.  For now I will continue all of his medicines as prescribed. Doesn't appear to be having any side effects or complications from anything. Would not discontinue the benzodiazepine's as they have been chronic. I will follow-up as needed.

## 2015-05-20 NOTE — Progress Notes (Signed)
[facility] NAME: [name]    MR#:  [id number]  DATE OF BIRTH:  [birth year]  SUBJECTIVE:  CHIEF COMPLAINT:   Chief Complaint  Patient presents with  . Weakness   Patient here due to weakness, shortness of breath and also chest pain. This morning patient is quite lethargic and difficult to understand.  REVIEW OF SYSTEMS:    Review of Systems  Unable to perform ROS Pt. Is lethargic/encephalopathic.    Nutrition: Nothing by mouth Tolerating Diet: No Tolerating PT: Await evaluation     DRUG ALLERGIES:   Allergies  Allergen Reactions  . Penicillins Anaphylaxis and Hives  . Demerol [Meperidine] Other (See Comments)    Reaction:  Hallucinations    . Dilaudid [Hydromorphone Hcl] Other (See Comments)    Reaction:  Hallucinations     VITALS:  Blood pressure 128/51, pulse 98, temperature 98.1 F (36.7 C), temperature source Oral, resp. rate 21, height 6\' 2"  (1.88 m), weight 121.7 kg (268 lb 4.8 oz), SpO2 96 %.  PHYSICAL EXAMINATION:   Physical Exam  GENERAL:  74 y.o.-year-old obese patient lying in the bed lethargic/encephalopathic.  EYES: Pupils equal, round, reactive to light and accommodation. No scleral icterus. Extraocular muscles intact.  HEENT: Head atraumatic, normocephalic. Oropharynx and nasopharynx clear. Dry oral mucosa. Positive oral thrush. NECK:  Supple, no jugular venous distention. No thyroid enlargement, no tenderness.  LUNGS: Poor respiratory effort. no wheezing, rales, rhonchi. No use of accessory muscles of respiration.  CARDIOVASCULAR: S1, S2 RRR. No murmurs, rubs, or gallops.  ABDOMEN: Soft, nontender, nondistended. Bowel sounds present. No organomegaly or mass.  EXTREMITIES: No cyanosis, clubbing.  +1-2 pitting edema bilaterally NEUROLOGIC: Cranial nerves II through XII are intact. No focal Motor or sensory deficits b/l.  Globally weak. Encephalopathic. PSYCHIATRIC: The patient is  alert and oriented x 1. Difficult to assess as patient is quite encephalopathic. SKIN: No obvious rash, lesion, or ulcer.    LABORATORY PANEL:   CBC  Recent Labs Lab 05/20/15 0350  WBC 11.1*  HGB 8.0*  HCT 26.4*  PLT 158   ------------------------------------------------------------------------------------------------------------------  Chemistries   Recent Labs Lab 05/19/15 0932 05/20/15 0350  NA 138 143  K 4.0 3.9  CL 95* 99*  CO2 35* 34*  GLUCOSE 176* 171*  BUN 6 6  CREATININE 0.82 0.85  CALCIUM 8.0* 8.1*  AST 16  --   ALT 12*  --   ALKPHOS 93  --   BILITOT 0.4  --    ------------------------------------------------------------------------------------------------------------------  Cardiac Enzymes  Recent Labs Lab 05/20/15 0350  TROPONINI 0.04*   ------------------------------------------------------------------------------------------------------------------  RADIOLOGY:  Ct Head Wo Contrast  05/19/2015   CLINICAL DATA:  Altered mental status  EXAM: CT HEAD WITHOUT CONTRAST  TECHNIQUE: Contiguous axial images were obtained from the base of the skull through the vertex without intravenous contrast.  COMPARISON:  None.  FINDINGS: There is age related volume loss. There is no intracranial mass, hemorrhage, extra-axial fluid collection, or midline shift. There is slight small vessel disease in the centra semiovale bilaterally. Elsewhere gray-white compartments appear normal. No acute infarct evident. The bony calvarium appears intact. The mastoid air cells on the right are clear. There is opacification of multiple mastoid air cells on the left with an air-fluid level in a posterior mastoid air cell on the left.  IMPRESSION: Age related volume loss with mild periventricular small vessel disease. No intracranial mass, hemorrhage, or evidence of acute infarct. Mastoid disease on the  left with an air-fluid level as well as opacification of multiple left-sided mastoid air  cells.   Electronically Signed   By: Lowella Grip III M.D.   On: 05/19/2015 14:56   Dg Chest Port 1 View  05/19/2015   CLINICAL DATA:  74 year old male with chest pain and shortness of breath. Subsequent encounter.  EXAM: PORTABLE CHEST - 1 VIEW  COMPARISON:  05/08/2015 chest x-ray.  05/03/2015 chest CT.  FINDINGS: Right PICC line has been removed.  Post median sternotomy with cardiomegaly.  Hiatal hernia.  Limited evaluation lung bases particularly on the left with similar appearance to prior plain film exam.  Central pulmonary vascular prominence without pulmonary edema.  No gross pneumothorax.  IMPRESSION: Post median sternotomy with cardiomegaly.  Hiatal hernia.  Limited evaluation lung bases particularly on the left with similar appearance to prior plain film exam.  Central pulmonary vascular prominence without pulmonary edema.   Electronically Signed   By: Genia Del M.D.   On: 05/19/2015 09:41     ASSESSMENT AND PLAN:   74 year old male with past medical history of diabetes, CHF, coronary disease, history of previous GI bleed, history of AV malformations, diverticulosis, gastric polyps, hypertension, abdominal aortic aneurysm repair, COPD, anxiety/depression, who presented to the hospital with weakness, shortness of breath and chest pain.  #1 altered mental status/encephalopathy-this is likely secondary to hypercarbic respiratory failure. -Patient's ABGs consistent with hypoxic hypercarbic respiratory failure. CT head on admission was negative for any acute neurologic process. We'll place patient on BiPAP and follow him clinically.  #2 acute on chronic hypercarbic hypoxic respiratory failure-this is likely secondary to CHF combined with underlying COPD. -We'll diurese the patient with IV Lasix for his CHF. Placed on BiPAP follow serial ABGs. -Continue Symbicort, Spiriva for COPD. Next  #3 CHF-acute on chronic diastolic dysfunction. Patient had echocardiogram last month showing EF of  55-60%. -Patient has been seen by cardiology and started on IV Lasix and will continue. -Follow I's and O's, daily weights. Continue metoprolol, lisinopril.  #4 elevated troponin-likely in the setting of nonischemic CHF. -Troponins have not trended up. No evidence of acute coronary syndrome. Patient was offered a stress test but he refused.  #5 oral thrush-we'll start on nystatin swish and swallow  #6 diabetes-continue Lantus, sliding scale insulin and follow blood sugars.  #7 GERD-continue Protonix.  #8 bipolar disorder-continue Depakote. -Await further psychiatric input regarding meds.  #9 anxiety/depression-continue Celexa, Xanax  Await physical therapy evaluation. Patient has been hospitalized multiple times in the past month and a half. May need long-term care/skilled nursing placement. Case management aware  All the records are reviewed and case discussed with Care Management/Social Workerr. Management plans discussed with the patient, family and they are in agreement.  CODE STATUS: Full  DVT Prophylaxis: Teds SCDs  TOTAL TIME TAKING CARE OF THIS PATIENT: 35 minutes.   POSSIBLE D/C IN 2-3 DAYS, DEPENDING ON CLINICAL CONDITION.   Henreitta Leber M.D on 05/20/2015 at 12:09 PM  Between 7am to 6pm - Pager - [phone]  After 6pm go to [email or web address] - password [email or web address] [facility]  Office  [phone]  CC: Primary care physician; Dustin Flock, MD

## 2015-05-20 NOTE — Care Management (Signed)
Patient is obtunded this morning and does not respond to questions.  Attending is ordering ABG.  Patient is not able to work with physical therapy at present time due to current mental state.  Psych is to consult

## 2015-05-20 NOTE — Progress Notes (Signed)
Inpatient Diabetes Program Recommendations  AACE/ADA: New Consensus Statement on Inpatient Glycemic Control (2013)  Target Ranges:  Prepandial:   less than 140 mg/dL      Peak postprandial:   less than 180 mg/dL (1-2 hours)      Critically ill patients:  140 - 180 mg/dL  Results for BRYANN, MCNEALY (MRN [id number]) as of 05/20/2015 13:20  Ref. Range 05/20/2015 07:19 05/20/2015 11:02  Glucose-Capillary Latest Ref Range: 65-99 mg/dL 142 (H) 146 (H)   -Consult received -Current order for home dose basal and glucose is within target. Will follow. Thank you  Raoul Pitch BSN, RN,CDE Inpatient Diabetes Coordinator [phone] (team pager)

## 2015-05-20 NOTE — Consult Note (Signed)
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 consolidation. Stable LEFT lung base atelectasis. Apical pleural thickening. No pneumothorax. Soft tissue planes and included osseous structure nonsuspicious.  IMPRESSION: Stable cardiomegaly and COPD.  Stable LEFT lung base atelectasis.   Electronically Signed   By: Elon Alas M.D.   On: 05/03/2015 06:32   Dg Chest Portable 1 View  04/22/2015   CLINICAL DATA:  Acute onset of bilateral chest pain and generalized abdominal pain. Shortness of breath. Initial encounter.  EXAM: PORTABLE CHEST - 1 VIEW  COMPARISON:  Chest radiograph performed 04/10/2015  FINDINGS: The lungs are well-aerated. A small left pleural effusion is noted. Pulmonary vascularity is at the upper limits of normal. Left basilar opacity likely reflects atelectasis. There is no  evidence of pneumothorax.  The cardiomediastinal silhouette is mildly enlarged. The patient is status post median sternotomy. No acute osseous abnormalities are seen.  IMPRESSION: Small left pleural effusion noted. Mild cardiomegaly. Persistent mild left basilar airspace opacity likely reflects atelectasis.   Electronically Signed   By: Garald Balding  M.D.   On: 04/22/2015 01:19    EKG: showed sinus tachycardia, 102 bpm, rare PACs, RBBB, inferior st/t changes.  Weights: Filed Weights   05/19/15 0925 05/19/15 1459 05/20/15 0425  Weight: 262 lb (118.842 kg) 269 lb 3.2 oz (122.108 kg) 268 lb 4.8 oz (121.7 kg)     Physical Exam: Blood pressure 155/76, pulse 98, temperature 98.1 F (36.7 C), temperature source Oral, resp. rate 24, height 6\' 2"  (1.88 m), weight 268 lb 4.8 oz (121.7 kg), SpO2 97 %. Body mass index is 34.43 kg/(m^2). General: Well developed, well nourished, in no acute distress. Head: Normocephalic, atraumatic, sclera non-icteric, no xanthomas, nares are without discharge.  Neck: Negative for carotid bruits. JVD not elevated. Lungs: Coarse breath sounds bilaterally with diffuse rhonchi and crackles. Breathing is unlabored. Heart: RRR with S1 S2. No murmurs, rubs, or gallops appreciated. Abdomen: Obese, soft, non-tender, non-distended with normoactive bowel sounds. No hepatomegaly. No rebound/guarding. No obvious abdominal masses. Msk:  Strength and tone appear normal for age. Extremities: No clubbing or cyanosis. No edema.  Distal pedal pulses are 2+ and equal bilaterally. Neuro: Alert and oriented X 3. No facial asymmetry. No focal deficit. Moves all extremities spontaneously. Psych:  Responds to questions appropriately with a normal affect.    Assessment and Plan:   1. Chest pain: -Seems related to cough and work of breathing, initially requiring increased oxygen via nasal cannula -Recent CTA chest negative for PE -Patient adamantly refuses stress test this morning. Reviewed risks and benefits with him. He understands these and continues to refuse this test. He states he may undergo this test as an outpatient  -History ongoing GIB with anemia as low as 6, currently at 8, would not pursue LHC at this time as he would not be a candidate for DAPT. He previously required 6 units of pRBCs -Enzymes mildly elevated and flat  trending, possibly 2/2 COPD exacerbation and mild diastolic CHF   2. Chronic respiratory failure: -Secondary to COPD vs possibly component of mild diastolic CHF -Add IV Lasix 20 mg q 12 hours -CXR with no pneumonia continue current meds per IM -Per primary service  3. History of GIB/gastritis: -Per IM/GI  4. Anemia:  -Hct low / stable -Guaiac stools, pending  -Continue GI meds -Transfuse prn -Symptoms of ongoing fatigue likely 2/2 #'s 2, 3, and 4. Though unable to fully rule out ischemia. He refuses ischemic evaluation at this time.     SignedChristell Faith, PA-C Pager: [phone] 05/20/2015, 8:48 AM

## 2015-05-21 ENCOUNTER — Inpatient Hospital Stay: Payer: Commercial Managed Care - HMO | Admitting: Oncology

## 2015-05-21 LAB — BASIC METABOLIC PANEL
Anion gap: 7 (ref 5–15)
BUN: 7 mg/dL (ref 6–20)
CO2: 38 mmol/L — ABNORMAL HIGH (ref 22–32)
Calcium: 7.7 mg/dL — ABNORMAL LOW (ref 8.9–10.3)
Chloride: 99 mmol/L — ABNORMAL LOW (ref 101–111)
Creatinine, Ser: 1.14 mg/dL (ref 0.61–1.24)
GFR calc Af Amer: >60 mL/min (ref >60)
GFR calc non Af Amer: >60 mL/min (ref >60)
Glucose, Bld: 130 mg/dL — ABNORMAL HIGH (ref 65–99)
Potassium: 4.3 mmol/L (ref 3.5–5.1)
SODIUM: 144 mmol/L (ref 135–145)

## 2015-05-21 LAB — FOLATE: Folate: 37 ng/mL (ref >5.9)

## 2015-05-21 LAB — IRON AND TIBC
IRON: 22 ug/dL — AB (ref 45–182)
Saturation Ratios: 7 % — ABNORMAL LOW (ref 17.9–39.5)
TIBC: 322 ug/dL (ref 250–450)
UIBC: 300 ug/dL

## 2015-05-21 LAB — GLUCOSE, CAPILLARY
GLUCOSE-CAPILLARY: 195 mg/dL — AB (ref 65–99)
GLUCOSE-CAPILLARY: 242 mg/dL — AB (ref 65–99)
Glucose-Capillary: 122 mg/dL — ABNORMAL HIGH (ref 65–99)
Glucose-Capillary: 109 mg/dL — ABNORMAL HIGH (ref 65–99)
Glucose-Capillary: 110 mg/dL — ABNORMAL HIGH (ref 65–99)

## 2015-05-21 LAB — VITAMIN B12: Vitamin B-12: 629 pg/mL (ref 180–914)

## 2015-05-21 LAB — CBC
HCT: 25.8 % — ABNORMAL LOW (ref 40.0–52.0)
Hemoglobin: 7.8 g/dL — ABNORMAL LOW (ref 13.0–18.0)
MCH: 27.2 pg (ref 26.0–34.0)
MCHC: 30.4 g/dL — ABNORMAL LOW (ref 32.0–36.0)
MCV: 89.4 fL (ref 80.0–100.0)
Platelets: 167 10*3/uL (ref 150–440)
RBC: 2.88 MIL/uL — AB (ref 4.40–5.90)
RDW: 21 % — ABNORMAL HIGH (ref 11.5–14.5)
WBC: 8.9 10*3/uL (ref 3.8–10.6)

## 2015-05-21 LAB — RETICULOCYTES
RBC.: 2.88 MIL/uL — ABNORMAL LOW (ref 4.40–5.90)
RETIC COUNT ABSOLUTE: 253.4 10*3/uL — AB (ref 19.0–183.0)
Retic Ct Pct: 8.8 % — ABNORMAL HIGH (ref 0.4–3.1)

## 2015-05-21 LAB — FERRITIN: Ferritin: 71 ng/mL (ref 24–336)

## 2015-05-21 LAB — AMMONIA: AMMONIA: 27 umol/L (ref 9–35)

## 2015-05-21 MED ORDER — DILTIAZEM LOAD VIA INFUSION
10.0000 mg | Freq: Once | INTRAVENOUS | Status: DC
  Filled 2015-05-21: qty 10

## 2015-05-21 MED ORDER — SODIUM CHLORIDE 0.9 % IV BOLUS (SEPSIS)
500.0000 mL | Freq: Once | INTRAVENOUS | Status: AC
  Administered 2015-05-21: 500 mL via INTRAVENOUS

## 2015-05-21 MED ORDER — DILTIAZEM HCL 25 MG/5ML IV SOLN
INTRAVENOUS | Status: AC
  Administered 2015-05-21: 10 mg
  Filled 2015-05-21: qty 5

## 2015-05-21 MED ORDER — HYDROCODONE-ACETAMINOPHEN 5-325 MG PO TABS
1.0000 | ORAL_TABLET | Freq: Four times a day (QID) | ORAL | Status: DC | PRN
  Administered 2015-05-21 – 2015-05-22 (×3): 1 via ORAL
  Filled 2015-05-21 (×3): qty 1

## 2015-05-21 MED ORDER — METOPROLOL TARTRATE 25 MG PO TABS
25.0000 mg | ORAL_TABLET | Freq: Two times a day (BID) | ORAL | Status: DC
  Administered 2015-05-21 – 2015-05-23 (×3): 25 mg via ORAL
  Filled 2015-05-21 (×5): qty 1

## 2015-05-21 MED ORDER — SODIUM CHLORIDE 0.9 % IV SOLN
Freq: Once | INTRAVENOUS | Status: AC
  Administered 2015-05-21: 05:00:00 via INTRAVENOUS

## 2015-05-21 MED ORDER — METHYLPREDNISOLONE SODIUM SUCC 125 MG IJ SOLR
60.0000 mg | INTRAMUSCULAR | Status: DC
  Administered 2015-05-21 – 2015-05-22 (×2): 60 mg via INTRAVENOUS
  Filled 2015-05-21 (×2): qty 2

## 2015-05-21 NOTE — Progress Notes (Signed)
Spoke with dr. Benjie Karvonen to make aware patient remains lethargic no repeat abg has been ordered. Patients current blood pressure is 90/53 sat 93-94 on 40%bipap. Also per night shift patient had refused stress test and still currently npo. Per md leave patient npo due to loc. Will continue to monitor. Per md she will place an order for abg and cancelled blood pressure medications [place]

## 2015-05-21 NOTE — Progress Notes (Signed)
Tolerated Bipap all night with oxygen saturations 93%. Telemetry reading NSR. Stood to void with dyspnea noted on exertion.  Sit up approximate 45 degree to take medication. Upset d/t order NPO except for medications. Blood sugar 133 and medicated per SS coverage.

## 2015-05-21 NOTE — Progress Notes (Signed)
Dr Fay Records called regarding blood pressure 80/46. Oxygen saturation 100% on bi pap. I explained that normal saline bolus infusing at 500 cc/hr. I explained he talking to me with color pink.

## 2015-05-21 NOTE — Progress Notes (Signed)
Spoke with dr mody to make aware of hr 130-140 current bp 94/54. Per md order 10mg  iv cardizem x1 and ns 500 ml bolus

## 2015-05-21 NOTE — Plan of Care (Signed)
Problem: Phase II Progression Outcomes Goal: Stress Test if indicated Outcome: Not Met (add Reason) Patient refused stress test

## 2015-05-21 NOTE — Care Management Important Message (Signed)
Important Message  Patient Details  Name: [name] MRN: [id number] Date of Birth: 29-Jul-1941   Medicare Important Message Given:  Yes-second notification given    [name] 05/21/2015, 9:51 AM

## 2015-05-21 NOTE — Progress Notes (Signed)
Called to patient's room to put patient back on Bipap. Came to the room, patient was in no apparent distress, on the phone using loud profanity to caller. SpO2 was 95% on 5 lpm. Informed patient that I was there to place him on Bipap. Patient began verbally assaulting me. I informed him that I'd be happy to help him without him speaking to me in that manner. He proceeded to tell me to get out of his room. I informed nurse that patient refused therapy and that if he wanted me to come back that I would have an [name] officer come into the room to assist me. Nursing supervisor informed of situation.

## 2015-05-21 NOTE — Progress Notes (Signed)
Patient: [name] / Admit Date: 05/19/2015 / Date of Encounter: 05/21/2015, 8:48 AM   Subjective: Patient is very agitated this morning. He is upset he is having to wear a BiPAP 2/2 his hypotension. He is unruly and cussing. He continues to refuse cardiac evaluation. No cardiac complaints.    Review of Systems: Review of Systems  Constitutional: Negative for fever and chills.  HENT: Positive for congestion.   Eyes: Negative for discharge and redness.  Respiratory: Positive for cough, sputum production, shortness of breath and wheezing. Negative for hemoptysis.   Cardiovascular: Positive for leg swelling. Negative for chest pain and orthopnea.  Gastrointestinal: Negative for nausea and vomiting.  Skin: Negative for rash.  Endo/Heme/Allergies: Does not bruise/bleed easily.  Psychiatric/Behavioral:       Patient is agitated.      Objective: Telemetry: NSR, 80's, question WCT vs artifact Physical Exam: Blood pressure 90/53, pulse 81, temperature 98.1 F (36.7 C), temperature source Oral, resp. rate 22, height 6\' 2"  (1.88 m), weight 262 lb 1.6 oz (118.888 kg), SpO2 93 %. Body mass index is 33.64 kg/(m^2). General: Well developed, well nourished, in no acute distress, agitated. Head: Normocephalic, atraumatic, sclera non-icteric, no xanthomas, nares are without discharge. Neck: Negative for carotid bruits. JVP not elevated. Lungs: Coarse breath sounds bilaterally without wheezes, rales, or rhonchi. Breathing is unlabored. On BiPAP.  Heart: RRR S1 S2 without murmurs, rubs, or gallops.  Abdomen: Obese, soft, non-tender, non-distended with normoactive bowel sounds. No rebound/guarding. Extremities: No clubbing or cyanosis. Trace, non-pitting edema to the bilateral mid calves. Distal pedal pulses are 2+ and equal bilaterally. Neuro: Alert and oriented X 3. Moves all extremities spontaneously. Psych:  Responds to questions appropriately with a normal affect.   Intake/Output Summary  (Last 24 hours) at 05/21/15 0848 Last data filed at 05/21/15 0806  Gross per 24 hour  Intake      0 ml  Output   1200 ml  Net  -1200 ml    Inpatient Medications:  . atorvastatin  40 mg Oral QHS  . budesonide-formoterol  2 puff Inhalation BID  . citalopram  20 mg Oral Daily  . divalproex  500 mg Oral QHS  . docusate sodium  100 mg Oral BID  . ferrous sulfate  650 mg Oral TID WC  . folic acid  2 mg Oral q morning - 10a  . insulin aspart  0-5 Units Subcutaneous QHS  . insulin aspart  0-9 Units Subcutaneous TID WC  . insulin glargine  17 Units Subcutaneous BID  . levofloxacin (LEVAQUIN) IV  750 mg Intravenous Q24H  . nystatin  5 mL Mouth/Throat QID  . pantoprazole  40 mg Oral BID  . sucralfate  1 g Oral TID WC & HS  . tiotropium  18 mcg Inhalation Daily  . traZODone  50 mg Oral TID  . [START ON 05/25/2015] Vitamin D (Ergocalciferol)  50,000 Units Oral Q7 days   Infusions:    Labs:  Recent Labs  05/20/15 0350 05/21/15 0336  NA 143 144  K 3.9 4.3  CL 99* 99*  CO2 34* 38*  GLUCOSE 171* 130*  BUN 6 7  CREATININE 0.85 1.14  CALCIUM 8.1* 7.7*    Recent Labs  05/19/15 0932  AST 16  ALT 12*  ALKPHOS 93  BILITOT 0.4  PROT 5.9*  ALBUMIN 2.9*    Recent Labs  05/19/15 0932 05/20/15 0350 05/21/15 0807  WBC 14.6* 11.1* 8.9  NEUTROABS 12.5*  --   --  HGB 8.6* 8.0* 7.8*  HCT 27.5* 26.4* 25.8*  MCV 88.0 88.5 89.4  PLT 157 158 167    Recent Labs  05/19/15 0932 05/19/15 1533 05/19/15 2057 05/20/15 0350  TROPONINI 0.05* 0.04* 0.04* 0.04*   Invalid input(s): POCBNP  Recent Labs  05/19/15 2057  HGBA1C 6.8*     Weights: Filed Weights   05/19/15 1459 05/20/15 0425 05/21/15 0501  Weight: 269 lb 3.2 oz (122.108 kg) 268 lb 4.8 oz (121.7 kg) 262 lb 1.6 oz (118.888 kg)     Radiology/Studies:  Ct Head Wo Contrast  05/19/2015   CLINICAL DATA:  Altered mental status  EXAM: CT HEAD WITHOUT CONTRAST  TECHNIQUE: Contiguous axial images were obtained from the base  of the skull through the vertex without intravenous contrast.  COMPARISON:  None.  FINDINGS: There is age related volume loss. There is no intracranial mass, hemorrhage, extra-axial fluid collection, or midline shift. There is slight small vessel disease in the centra semiovale bilaterally. Elsewhere gray-white compartments appear normal. No acute infarct evident. The bony calvarium appears intact. The mastoid air cells on the right are clear. There is opacification of multiple mastoid air cells on the left with an air-fluid level in a posterior mastoid air cell on the left.  IMPRESSION: Age related volume loss with mild periventricular small vessel disease. No intracranial mass, hemorrhage, or evidence of acute infarct. Mastoid disease on the left with an air-fluid level as well as opacification of multiple left-sided mastoid air cells.   Electronically Signed   By: Lowella Grip III M.D.   On: 05/19/2015 14:56   Ct Angio Chest Pe W/cm &/or Wo Cm  05/03/2015   CLINICAL DATA:  Increase shortness of breath of the last 2 days. Dizziness. History of CABG, COPD, AAA repair, diabetes, CHF, CAD, AVM.  EXAM: CT ANGIOGRAPHY CHEST WITH CONTRAST  TECHNIQUE: Multidetector CT imaging of the chest was performed using the standard protocol during bolus administration of intravenous contrast. Multiplanar CT image reconstructions and MIPs were obtained to evaluate the vascular anatomy.  CONTRAST:  66mL OMNIPAQUE IOHEXOL 350 MG/ML SOLN  COMPARISON:  04/23/2015  FINDINGS: Heart: Heart is mildly enlarged. No pericardial effusion. Significant coronary artery disease. Previous median sternotomy and CABG.  Vascular structures: Pulmonary arteries are well opacified. There is no evidence for acute pulmonary embolus. There is atherosclerotic calcification of the thoracic arch not associated with aneurysm or dissection.  Mediastinum/thyroid: The visualized portion of the thyroid gland has a normal appearance. Small mediastinal lymph  nodes are noted, nonspecific in appearance. Large hiatal hernia is present.  Lungs/Airways: Emphysematous changes are present. Large bulla in the left lung apex again noted. No focal consolidations or pleural effusions. There is mild bronchiectasis in the left lung base. No suspicious pulmonary nodules.  Upper abdomen: Large hiatal hernia.  Colonic diverticulosis.  Chest wall/osseous structures: Status post median sternotomy. No suspicious lytic or blastic lesions are identified. Chronic left rib fracture.  Review of the MIP images confirms the above findings.  IMPRESSION: 1. Technically adequate exam showing no pulmonary embolus. 2. Cardiomegaly and significant coronary artery disease. 3. Emphysematous changes.  Bronchiectasis left lung base. 4. Large hiatal hernia. 5. Colonic diverticulosis.   Electronically Signed   By: Nolon Nations M.D.   On: 05/03/2015 09:34   Ct Abdomen Pelvis W Contrast  04/22/2015   CLINICAL DATA:  Generalized abdominal pain and melena.  EXAM: CT ABDOMEN AND PELVIS WITH CONTRAST  TECHNIQUE: Multidetector CT imaging of the abdomen and pelvis was performed  using the standard protocol following bolus administration of intravenous contrast.  CONTRAST:  146mL OMNIPAQUE IOHEXOL 300 MG/ML  SOLN  COMPARISON:  12/29/2014  FINDINGS: BODY WALL: Fatty bilateral inguinal hernias with early herniation of small bowel on the right.  LOWER CHEST: Large sliding hiatal hernia without obstruction or inflammatory change.  Mild atelectasis at the left base.  Coronary atherosclerosis.  ABDOMEN/PELVIS:  Liver: No focal abnormality.  Biliary: No evidence of biliary obstruction or stone.  Pancreas: Unremarkable.  Spleen: Unremarkable.  Adrenals: Unremarkable.  Kidneys and ureters: No hydronephrosis or stone. Stable scarring to the lower pole right kidney, likely from covering of a lower pole accessory renal artery.  Bladder: Unremarkable.  Reproductive: No pathologic findings.  Bowel: Appendectomy. There is  colonic diverticulosis, present mainly distally. Large sliding hiatal hernia. No inflammatory bowel wall thickening. A small distal duodenum diverticulum is noted, uncomplicated. No evidence of peptic ulcer disease, aortoenteric fistula or other explanation for melena.  Retroperitoneum: No mass or adenopathy.  Peritoneum: No ascites or pneumoperitoneum.  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 No evidence of major vessel stenosis or occlusion. Small volume gas within the right common femoral vein is presumably from vascular access. There is been right hypogastric artery embolization.  OSSEOUS: No acute abnormalities.  IMPRESSION: 1. No acute finding or explanation for melena. 2. Incidental findings are stable from prior study and noted above.   Electronically Signed   By: Monte Fantasia M.D.   On: 04/22/2015 03:55   Ct Chest High Resolution  04/23/2015   CLINICAL DATA:  74 year old male with history of cough producing blackish phlegm. History of GI bleeding.  EXAM: CT CHEST WITHOUT CONTRAST  TECHNIQUE: Multidetector CT imaging of the chest was performed following the standard protocol without intravenous contrast. High resolution imaging of the lungs, as well as inspiratory and expiratory imaging, was performed.  COMPARISON:  Chest CT 05/15/2013.  FINDINGS: Mediastinum/Lymph Nodes: Heart size is normal. There is no significant pericardial fluid, thickening or pericardial calcification. There is atherosclerosis of the thoracic aorta, the great vessels of the mediastinum and the coronary arteries, including calcified atherosclerotic plaque in the left main, left anterior descending and right coronary arteries. Status post median sternotomy for CABG, including LIMA to the LAD. No pathologically enlarged mediastinal or hilar lymph nodes. Please note that accurate exclusion of hilar adenopathy is limited on noncontrast CT scans. Large hiatal  hernia. No axillary lymphadenopathy.  Lungs/Pleura: There is some focal architectural distortion, what appears to be chronic volume loss, and some mild peribronchovascular ground-glass attenuation in the left lower lobe, which is favored to reflect a combination of chronic scarring and atelectasis, as well as some peribronchovascular inflammation and mild fibrosis, potentially related to chronic recurrent aspiration in the setting of this large hiatal hernia. High-resolution images otherwise demonstrate no significant additional areas of ground-glass attenuation, subpleural reticulation, parenchymal banding, traction bronchiectasis or frank honeycombing in other portions of the lungs to suggest a background of interstitial lung disease. Inspiratory and expiratory imaging is unremarkable. Mild diffuse bronchial wall thickening with generally mild centrilobular and paraseptal emphysema. There is a large bulla in the apex of the left upper lobe. No acute consolidative airspace disease. No pleural effusions. No suspicious appearing pulmonary nodules or masses.  Upper Abdomen: Unremarkable.  Musculoskeletal/Soft Tissues: Median sternotomy wires. There are no aggressive appearing lytic or blastic lesions noted in the visualized portions of the skeleton.  IMPRESSION: 1. There are no findings to strongly  suggest interstitial lung disease on today's examination. 2. There is a spectrum of findings in the left lower lobe, suggestive of some chronic scarring, atelectasis and likely mild fibrosis related to recurrent aspiration or prior infection, as discussed above. 3. Large hiatal hernia. 4. Mild diffuse bronchial wall thickening with mild centrilobular and paraseptal emphysema, as well as a large left apical bulla. 5. Atherosclerosis, including left main and 3 vessel coronary artery disease. Status post median sternotomy for CABG, including LIMA to the LAD.   Electronically Signed   By: Vinnie Langton M.D.   On: 04/23/2015  13:14   Dg Chest Port 1 View  05/19/2015   CLINICAL DATA:  74 year old male with chest pain and shortness of breath. Subsequent encounter.  EXAM: PORTABLE CHEST - 1 VIEW  COMPARISON:  05/08/2015 chest x-ray.  05/03/2015 chest CT.  FINDINGS: Right PICC line has been removed.  Post median sternotomy with cardiomegaly.  Hiatal hernia.  Limited evaluation lung bases particularly on the left with similar appearance to prior plain film exam.  Central pulmonary vascular prominence without pulmonary edema.  No gross pneumothorax.  IMPRESSION: Post median sternotomy with cardiomegaly.  Hiatal hernia.  Limited evaluation lung bases particularly on the left with similar appearance to prior plain film exam.  Central pulmonary vascular prominence without pulmonary edema.   Electronically Signed   By: Genia Del M.D.   On: 05/19/2015 09:41   Dg Chest Port 1 View  05/08/2015   CLINICAL DATA:  PICC line placement.  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 Right PICC has been placed and terminates over the lower SVC. Cardiomediastinal silhouette is unchanged, with increased density in the lower mediastinum consistent with known hiatal hernia. Bullous change is again seen in the left lung apex. Mild left basilar opacity remains, likely atelectasis or scarring. There is no evidence of pulmonary edema, sizable pleural effusion, or pneumothorax. No acute osseous abnormality is seen.  IMPRESSION: 1. Right PICC terminates over the lower SVC. 2. No evidence of acute cardiopulmonary process.   Electronically Signed   By: Logan Bores   On: 05/08/2015 11:37   Dg Chest Port 1 View  05/06/2015   CLINICAL DATA:  Shortness of Breath  EXAM: PORTABLE CHEST - 1 VIEW  COMPARISON:  Chest radiograph and chest CT May 02, 2014  FINDINGS: There is a stable large bulla in the left upper lobe. There is flattening of the hemidiaphragms. There is no frank edema or  consolidation. The heart is mildly enlarged with pulmonary vascularity within normal limits. There is a sizable hiatal type hernia. Patient is status post coronary artery bypass grafting. No adenopathy.  IMPRESSION: Underlying bullous emphysematous change. No edema or consolidation. Stable cardiac prominence. Sizable hiatal type hernia.   Electronically Signed   By: Lowella Grip III M.D.   On: 05/06/2015 14:54   Dg Chest Portable 1 View  05/03/2015   CLINICAL DATA:  Worsening shortness of breath. History of COPD, chest pain, CHF, diabetes, hypertension.  EXAM: PORTABLE CHEST - 1 VIEW  COMPARISON:  Chest radiograph April 22, 2015 and, CT chest April 23, 2015  FINDINGS: The cardiac silhouette appears moderately enlarged, status post median sternotomy. Increased lung volumes, consistent with history of COPD without pleural effusion or focal consolidation. Stable LEFT lung base atelectasis. Apical pleural thickening. No pneumothorax. Soft tissue planes and included osseous structure nonsuspicious.  IMPRESSION: Stable cardiomegaly and COPD.  Stable LEFT lung base  atelectasis.   Electronically Signed   By: Elon Alas M.D.   On: 05/03/2015 06:32   Dg Chest Portable 1 View  04/22/2015   CLINICAL DATA:  Acute onset of bilateral chest pain and generalized abdominal pain. Shortness of breath. Initial encounter.  EXAM: PORTABLE CHEST - 1 VIEW  COMPARISON:  Chest radiograph performed 04/10/2015  FINDINGS: The lungs are well-aerated. A small left pleural effusion is noted. Pulmonary vascularity is at the upper limits of normal. Left basilar opacity likely reflects atelectasis. There is no evidence of pneumothorax.  The cardiomediastinal silhouette is mildly enlarged. The patient is status post median sternotomy. No acute osseous abnormalities are seen.  IMPRESSION: Small left pleural effusion noted. Mild cardiomegaly. Persistent mild left basilar airspace opacity likely reflects atelectasis.   Electronically Signed    By: Garald Balding M.D.   On: 04/22/2015 01:19     Assessment and Plan  1. Chest pain: -Seems related to cough and work of breathing, initially requiring increased oxygen via nasal cannula -Recent CTA chest negative for PE -Patient adamantly refuses stress test this admission. Reviewed risks and benefits with him. He understands these and continues to refuse this test. He states he may undergo this test as an outpatient  -History ongoing GIB with anemia as low as 6, currently at 8, would not pursue LHC at this time as he would not be a candidate for DAPT. He previously required 6 units of pRBCs -Enzymes mildly elevated and flat trending, possibly 2/2 COPD exacerbation and mild diastolic CHF -Until patient permits further cardiac evaluation would not suggest any further ischemic work up and would sign off  2. Chronic respiratory failure: -Secondary to COPD vs possibly component of mild diastolic CHF -Leukocytosis improved on Levaquin -Given his hypotension will need to hold Lasix until this resolves  -CXR with no pneumonia continue current meds per IM -Per primary service  3. History of GIB/gastritis: -HGB trending down -Per IM/GI  4. Anemia:  -Hgb/Hct trending down, possibly 2/2 IV fluids given 2/2 his hypotension  -Needs to complete GI work up that was advised last admission  -Continue GI meds -Transfuse prn -Symptoms of ongoing fatigue likely 2/2 #'s 2, 3, and 4. Though unable to fully rule out ischemia 2/2 patient refusal.   5. Hypotension: -BP soft with diuresis and down trending anemia  -Required NS bolus  -Lisinopril and metoprolol have been suspended   6. Patient agitation: -Patient unruly and cussing throughout visit -He is unhappy with his care, though he is limiting his care as he is refusing vitals parts of his work up -Psych has been consulted, would see if primary team wants them to re-visit    Signed, Christell Faith, PA-C Pager: [phone] 05/21/2015, 8:48  AM

## 2015-05-21 NOTE — Progress Notes (Addendum)
Status: 69yr male, Unstable angina, not in good spirits, oxygen Family: none present Visit Assessment: The chaplain visited the patient. He seemed to be grieving. His health is not favorable and he is truly going through a tough time in his life, right now. I asked the nursing station as per his request to assist him with urination as he yelled, through his Oxygen that he had to "piss." Prayers for healing and comfort will be lifted on his behalf as he did experience a rapid response this early AM  Pastoral Care: [phone] pager or submit an online request

## 2015-05-21 NOTE — Progress Notes (Deleted)
Dr. Benjie Karvonen made aware of hr 130-140. Current bp 94/54

## 2015-05-21 NOTE — Progress Notes (Signed)
1007 called to room and nurses at bedside telling pt is lethargic and became non responsive to sternum rub. Bi pap intact with oxygen saturation 88%. R/T at bedside doing sternum rub and he responded cursing at staff. Dr Reece Levy notified and gave order for normal saline 500 cc bolus. IV infusing at pt responsive talking to me. Color pink. See vital sign flowsheet.
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
## 2015-05-21 NOTE — Care Management (Signed)
Patient's PCO2 is 70.  Patient says "what are you doing here.. Why are you asking me how I am..Marland KitchenMarland KitchenWhat has happened...  I do not remember anything.  Patient does not appear to be able to process the information that yesterday he was probably too sick to remember anything.  He is refusing some some of the cardiac work up that has been recommended.  Has required IV cardizem today for heart rate > 130.  Discussed during progression that wonder if patient is capable at the present time to make informed decisions.  He is becoming more alert

## 2015-05-21 NOTE — Progress Notes (Signed)
PT Cancellation Note  Patient Details Name: [name] MRN: [id number] DOB: 1941-07-04   Cancelled Treatment:    Reason Eval/Treat Not Completed: Fatigue/lethargy limiting ability to participate;Patient not medically ready. PT reviewed chart and discussed case with RN. Per RN patient is still on BiPap and very lethargic. RN states patient is likely not appropriate for PT, and that PT should attempt mobility evaluation at a later time. PT will re-attempt when patient is more appropriate for PT mobility evaluation.   Kerman Passey, PT, DPT    05/21/2015, 9:27 AM
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
## 2015-05-21 NOTE — Progress Notes (Signed)
[place] [facility] [place] NAME: [name]    MR#:  [id number]  DATE OF BIRTH:  1940-11-16  SUBJECTIVE:  The patient's main complaint this morning is that he is thirsty and hungry His ABG this morning reveals a CO2 of 70 which is decreased from yesterday. REVIEW OF SYSTEMS:    Review of Systems  Constitutional: Negative for fever, chills and malaise/fatigue.  HENT: Negative for sore throat.   Eyes: Negative for blurred vision.  Respiratory: Positive for cough and sputum production. Negative for hemoptysis, shortness of breath and wheezing.   Cardiovascular: Negative for chest pain, palpitations and leg swelling.  Gastrointestinal: Negative for nausea, vomiting, abdominal pain, diarrhea and blood in stool.  Genitourinary: Negative for dysuria.  Musculoskeletal: Negative for back pain.  Neurological: Negative for dizziness, tremors, sensory change, speech change, focal weakness, seizures and headaches.  Endo/Heme/Allergies: Does not bruise/bleed easily.  .    Currently nothing by mouth   DRUG ALLERGIES:   Allergies  Allergen Reactions  . Penicillins Anaphylaxis and Hives  . Demerol [Meperidine] Other (See Comments)    Reaction:  Hallucinations    . Dilaudid [Hydromorphone Hcl] Other (See Comments)    Reaction:  Hallucinations     VITALS:  Blood pressure 94/54, pulse 91, temperature 97.6 F (36.4 C), temperature source Oral, resp. rate 14, height 6\' 2"  (1.88 m), weight 118.888 kg (262 lb 1.6 oz), SpO2 99 %.  PHYSICAL EXAMINATION:   Physical Exam  Constitutional: He is well-developed, well-nourished, and in no distress. No distress.  HENT:  Head: Normocephalic.  Wearing BIPAP  Eyes: No scleral icterus.  Neck: Normal range of motion. Neck supple. No JVD present. No tracheal deviation present.  Cardiovascular: Normal rate, regular rhythm and normal heart sounds.  Exam reveals no gallop and no friction rub.   No murmur  heard. Pulmonary/Chest: Effort normal and breath sounds normal. No respiratory distress. He has no wheezes. He has no rales. He exhibits no tenderness.  Abdominal: Soft. Bowel sounds are normal. He exhibits no distension and no mass. There is no tenderness. There is no rebound and no guarding.  Musculoskeletal: Normal range of motion. He exhibits no edema.  Neurological: He is alert.  Skin: Skin is warm. No rash noted. No erythema.  Psychiatric: Affect normal.      LABORATORY PANEL:   CBC  Recent Labs Lab 05/21/15 0807  WBC 8.9  HGB 7.8*  HCT 25.8*  PLT 167   ------------------------------------------------------------------------------------------------------------------  Chemistries   Recent Labs Lab 05/19/15 0932  05/21/15 0336  NA 138  < > 144  K 4.0  < > 4.3  CL 95*  < > 99*  CO2 35*  < > 38*  GLUCOSE 176*  < > 130*  BUN 6  < > 7  CREATININE 0.82  < > 1.14  CALCIUM 8.0*  < > 7.7*  AST 16  --   --   ALT 12*  --   --   ALKPHOS 93  --   --   BILITOT 0.4  --   --   < > = values in this interval not displayed. ------------------------------------------------------------------------------------------------------------------  Cardiac Enzymes  Recent Labs Lab 05/20/15 0350  TROPONINI 0.04*   ------------------------------------------------------------------------------------------------------------------  RADIOLOGY:  Ct Head Wo Contrast  05/19/2015   CLINICAL DATA:  Altered mental status  EXAM: CT HEAD WITHOUT CONTRAST  TECHNIQUE: Contiguous axial images were obtained from the base of the skull through the vertex without intravenous  contrast.  COMPARISON:  None.  FINDINGS: There is age related volume loss. There is no intracranial mass, hemorrhage, extra-axial fluid collection, or midline shift. There is slight small vessel disease in the centra semiovale bilaterally. Elsewhere gray-white compartments appear normal. No acute infarct evident. The bony calvarium  appears intact. The mastoid air cells on the right are clear. There is opacification of multiple mastoid air cells on the left with an air-fluid level in a posterior mastoid air cell on the left.  IMPRESSION: Age related volume loss with mild periventricular small vessel disease. No intracranial mass, hemorrhage, or evidence of acute infarct. Mastoid disease on the left with an air-fluid level as well as opacification of multiple left-sided mastoid air cells.   Electronically Signed   By: Lowella Grip III M.D.   On: 05/19/2015 14:56     ASSESSMENT AND PLAN:   74 year old male with past medical history of diabetes, CHF, coronary disease, history of previous GI bleed, history of AV malformations, diverticulosis, gastric polyps, hypertension, abdominal aortic aneurysm repair, COPD, anxiety/depression, who presented to the hospital with weakness, shortness of breath and chest pain.  1 altered mental status/encephalopathy: This is due to hypercarbic respiratory failure.Patient's ABGs consistent with hypoxic hypercarbic respiratory failure. CT head on admission was negative for any acute neurologic process. Patient is currently on BiPAP which I will continue through today. His CO2 level is decreasing. He is no longer encephalopathic. He appears to be at his baseline.  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 Continue BiPAP. Patient has no wheezing on examination but may benefit from low-dose steroids which I will start today.  3 CHF-acute on chronic diastolic dysfunction. Patient had echocardiogram last month showing EF of 55-60%. Patient has been seen and evaluated by cardiology. Patient is no longer an CHF exacerbation. Patient has diuresed appropriately. Due to low blood pressure have stopped Lasix. Marland Kitchen  4 elevated troponin : This is due to above issues. This is not acute coronary syndrome. Cardiology did recommend further  workup such as stress test however patient adamantly refuses.   5. oral thrush: Patient will continue nystatin swish and swallow.  6 diabetes without complications: Continue Lantus, sliding scale insulin.  7 GERD-continue Protonix.  8 bipolar disorder-continue Depakote. Psychiatry is seeing the patient in consultation. 9 anxiety/depression-continue Celexa, Xanax  Awaiting physical therapy evaluation for placement. Case management is involved. Patient cannot work with physical therapy as he is on BiPAP.   All the records are reviewed and case discussed with Care Management/Social Worker. Management plans discussed with the patient and he is in agreement. CODE STATUS: Full  DVT Prophylaxis: Teds SCDs  TOTAL TIME TAKING CARE OF THIS PATIENT: 30 minutes.   POSSIBLE D/C IN 2-3 DAYS, DEPENDING ON CLINICAL CONDITION.   Niveah Boerner M.D on 05/21/2015 at 11:57 AM  Between 7am to 6pm - Pager - [phone]  After 6pm go to [email or web address] - password [email or web address] [facility]  [facility]  Office  [phone]  CC: Primary care physician; No primary care provider on file.

## 2015-05-22 LAB — CBC
HCT: 24.9 % — ABNORMAL LOW (ref 40.0–52.0)
HEMOGLOBIN: 7.6 g/dL — AB (ref 13.0–18.0)
MCH: 27.5 pg (ref 26.0–34.0)
MCHC: 30.6 g/dL — ABNORMAL LOW (ref 32.0–36.0)
MCV: 89.9 fL (ref 80.0–100.0)
Platelets: 157 10*3/uL (ref 150–440)
RBC: 2.76 MIL/uL — ABNORMAL LOW (ref 4.40–5.90)
RDW: 21.1 % — AB (ref 11.5–14.5)
WBC: 6.5 10*3/uL (ref 3.8–10.6)

## 2015-05-22 LAB — BLOOD GAS, ARTERIAL
ALLENS TEST (PASS/FAIL): POSITIVE — AB
Acid-Base Excess: 15.1 mmol/L — ABNORMAL HIGH (ref 0.0–3.0)
BICARBONATE: 43.4 meq/L — AB (ref 21.0–28.0)
DELIVERY SYSTEMS: POSITIVE
Expiratory PAP: 6
FIO2: 40
INSPIRATORY PAP: 16
O2 Saturation: 85.7 %
PCO2 ART: 70 mmHg — AB (ref 32.0–48.0)
PH ART: 7.4 (ref 7.350–7.450)
PO2 ART: 51 mmHg — AB (ref 83.0–108.0)
Patient temperature: 37

## 2015-05-22 LAB — URINE DRUG SCREEN, QUALITATIVE (ARMC ONLY)
Amphetamines, Ur Screen: NOT DETECTED
BARBITURATES, UR SCREEN: NOT DETECTED
BENZODIAZEPINE, UR SCRN: POSITIVE — AB
Cannabinoid 50 Ng, Ur ~~LOC~~: NOT DETECTED
Cocaine Metabolite,Ur ~~LOC~~: NOT DETECTED
MDMA (Ecstasy)Ur Screen: NOT DETECTED
Methadone Scn, Ur: NOT DETECTED
OPIATE, UR SCREEN: POSITIVE — AB
Phencyclidine (PCP) Ur S: NOT DETECTED
TRICYCLIC, UR SCREEN: NOT DETECTED

## 2015-05-22 LAB — GLUCOSE, CAPILLARY
GLUCOSE-CAPILLARY: 173 mg/dL — AB (ref 65–99)
GLUCOSE-CAPILLARY: 155 mg/dL — AB (ref 65–99)
GLUCOSE-CAPILLARY: 426 mg/dL — AB (ref 65–99)
Glucose-Capillary: 249 mg/dL — ABNORMAL HIGH (ref 65–99)

## 2015-05-22 LAB — BASIC METABOLIC PANEL
Anion gap: 8 (ref 5–15)
BUN: 15 mg/dL (ref 6–20)
CALCIUM: 7.2 mg/dL — AB (ref 8.9–10.3)
CO2: 34 mmol/L — AB (ref 22–32)
Chloride: 99 mmol/L — ABNORMAL LOW (ref 101–111)
Creatinine, Ser: 1.39 mg/dL — ABNORMAL HIGH (ref 0.61–1.24)
GFR calc Af Amer: 56 mL/min — ABNORMAL LOW (ref >60)
GFR, EST NON AFRICAN AMERICAN: 49 mL/min — AB (ref >60)
Glucose, Bld: 281 mg/dL — ABNORMAL HIGH (ref 65–99)
Potassium: 4.5 mmol/L (ref 3.5–5.1)
Sodium: 141 mmol/L (ref 135–145)

## 2015-05-22 MED ORDER — LEVOFLOXACIN 750 MG PO TABS
750.0000 mg | ORAL_TABLET | Freq: Every day | ORAL | Status: DC
  Administered 2015-05-22 – 2015-05-23 (×2): 750 mg via ORAL
  Filled 2015-05-22 (×2): qty 1

## 2015-05-22 MED ORDER — GUAIFENESIN 100 MG/5ML PO SYRP
200.0000 mg | ORAL_SOLUTION | ORAL | Status: DC | PRN
  Administered 2015-05-22: 200 mg via ORAL
  Filled 2015-05-22 (×3): qty 10

## 2015-05-22 NOTE — Evaluation (Signed)
Physical Therapy Evaluation Patient Details Name: [name] MRN: [id number] DOB: 1940/12/30 Today's Date: 05/22/2015   History of Present Illness  Patient is a 74 y/o male that presents with AMS secondary to hypoxic hyercarbic respiratory failure. He was admitted with malena and shortness of breath. Patient with complex past medical history including cardiopulmonary conditions and bipolar disorder.   Clinical Impression  Patient is a 74 y/o male that has presented with multiple admissions over the last several months. Patient displays labored breathing throughout ambulation today and fatigues quickly, and is unable to ambulate safe household distances. Patient also requires heavy sit to stand assistance, and appears to have made significant strength declines since previous admission. Patient would benefit from short term rehabilitation to increase his safety and independence with mobility. Skilled acute PT services are indicated to address the above deficits.     Follow Up Recommendations SNF    Equipment Recommendations       Recommendations for Other Services       Precautions / Restrictions Precautions Precautions: Fall Restrictions Weight Bearing Restrictions: No      Mobility  Bed Mobility Overal bed mobility: Modified Independent             General bed mobility comments: Patient has HOB elevated and uses hand rails to bring his legs off the side.   Transfers Overall transfer level: Needs assistance Equipment used: Rolling walker (2 wheeled) Transfers: Sit to/from Stand Sit to Stand: Mod assist         General transfer comment: Patient initially required mod A x 1 to stand with RW and loss of balance posteriorly. Patient sat after using urinal and required max A x1 to come to standing.   Ambulation/Gait Ambulation/Gait assistance: Min guard Ambulation Distance (Feet): 25 Feet Assistive device: Rolling walker (2 wheeled)     Gait velocity  interpretation: Below normal speed for age/gender General Gait Details: Patient with very minimal step length and externally rotated LEs with no loss of balance, but fatigued quickly on 5L of O2 and requested to return to bedside chair after reaching the doorway.   Stairs            Wheelchair Mobility    Modified Rankin (Stroke Patients Only)       Balance Overall balance assessment: Needs assistance Sitting-balance support: Feet supported Sitting balance-Leahy Scale: Good Sitting balance - Comments: No balance deficits noted.    Standing balance support: Bilateral upper extremity supported Standing balance-Leahy Scale: Fair Standing balance comment: Patient with poor balance requiring heavy assistance for sit to stand transfer, however once in standing he is able to use hand held urinal with no loss of balance and ambulates with RW with no loss of balance.                              Pertinent Vitals/Pain Pain Assessment:  (Patient does not complain of any pain during the session.)    Home Living Family/patient expects to be discharged to:: Private residence Living Arrangements: Non-relatives/Friends Available Help at Discharge: Family Type of Home: House Home Access: Stairs to enter Entrance Stairs-Rails: Left Entrance Stairs-Number of Steps: 2 Home Layout: One [place]: [place] - 2 wheels;Shower seat      Prior Function Level of Independence: Independent with assistive device(s)         Comments: He apparently has been using a RW though this has only been for limited mobility.  Hand Dominance   Dominant Hand: Right    Extremity/Trunk Assessment   Upper Extremity Assessment: Overall WFL for tasks assessed           Lower Extremity Assessment: Overall WFL for tasks assessed      Cervical / Trunk Assessment: Normal  Communication   Communication: HOH (Short term memory deficits and forgetful. )  Cognition  Arousal/Alertness: Awake/alert Behavior During Therapy: WFL for tasks assessed/performed Overall Cognitive Status: History of cognitive impairments - at baseline       Memory: Decreased short-term memory (Brings up same conversation topics multiple times throughout the session.)              General Comments      Exercises        Assessment/Plan    PT Assessment Patient needs continued PT services  PT Diagnosis Difficulty walking;Generalized weakness   PT Problem List Decreased strength;Decreased range of motion;Decreased activity tolerance;Decreased balance;Decreased mobility;Decreased coordination;Cardiopulmonary status limiting activity;Obesity  PT Treatment Interventions DME instruction;Gait training;Stair training;Functional mobility training;Therapeutic activities;Therapeutic exercise;Balance training;Neuromuscular re-education;Patient/family education   PT Goals (Current goals can be found in the Care Plan section) Acute Rehab PT Goals Patient Stated Goal: To improve his walking distance.  PT Goal Formulation: With patient Time For Goal Achievement: 06/05/15 Potential to Achieve Goals: Good    Frequency Min 2X/week   Barriers to discharge   Recurrent admissions, weakness, multiple steps, and no assistance with standing.     Co-evaluation               End of Session Equipment Utilized During Treatment: Gait belt;Oxygen Activity Tolerance: Patient limited by fatigue;Patient tolerated treatment well Patient left: in chair;with call bell/phone within reach;with chair alarm set Nurse Communication: Mobility status (RN notified no alarm unit present but pad in place.)         Time: 7939-0300 PT Time Calculation (min) (ACUTE ONLY): 27 min   Charges:   PT Evaluation $Initial PT Evaluation Tier I: 1 Procedure     PT G Codes:        Royce Macadamia 05/22/2015, 1:42 PM

## 2015-05-22 NOTE — Care Management (Signed)
Physical therapy is recommending skilled nursing.  This would be the best option for this patient but it is thought patient will refuse.  Discussed the need to have psych assess patient's capacity make decisions regarding his care.

## 2015-05-22 NOTE — Progress Notes (Signed)
Pt refusing to wear Bi pap tonight. On 5L nasal canula, oxygen saturation @ 95%

## 2015-05-22 NOTE — Progress Notes (Signed)
Notified Dr. Fay Records of hemoglobin of 7.6 No new orders received. Follow with morning doctors per Dr. Fay Records.

## 2015-05-22 NOTE — Progress Notes (Signed)
Per Md Mody, hold metoprolol this morning

## 2015-05-22 NOTE — Progress Notes (Signed)
   05/22/15 1900  Clinical Encounter Type  Visited With Patient  Visit Type Spiritual support  Referral From Nurse  Consult/Referral To Chaplain  Spiritual Encounters  Spiritual Needs Emotional  Paged by nurse to unit.  Patient wanted to speak to a chaplain.  Provided pastoral care, compassion and active listening.  [place] [phone]

## 2015-05-22 NOTE — Progress Notes (Signed)
Inpatient Diabetes Program Recommendations  AACE/ADA: New Consensus Statement on Inpatient Glycemic Control (2013)  Target Ranges:  Prepandial:   less than 140 mg/dL      Peak postprandial:   less than 180 mg/dL (1-2 hours)      Critically ill patients:  140 - 180 mg/dL     Results for [name], [name] (MRN [id number]) as of 05/22/2015 09:22  Ref. Range 05/21/2015 07:32 05/21/2015 12:03 05/21/2015 16:15 05/21/2015 19:53 05/22/2015 07:30  Glucose-Capillary Latest Ref Range: 65-99 mg/dL 109 (H) 110 (H) 195 (H) 242 (H) 173 (H)    Reason for assessment: elevated CBG  Diabetes history: Type 2 Outpatient Diabetes medications: Lantus 17 units bid, Novolog 10 units tid with meal Current orders for Inpatient glycemic control: Lantus 17 units bid, Novolog 0-9 units tid with meals, Novolog 0-5 units qhs.   Since the patient has started on steroids, consider increasing the Novolog correction insulin to Novolog resistant scale 0-20 units tid.   Gentry Fitz, RN, BA, MHA, CDE Diabetes Coordinator Inpatient Diabetes Program  [phone] (Team Pager) [phone] ([place]) 05/22/2015 9:23 AM

## 2015-05-22 NOTE — Progress Notes (Signed)
Order Robitussin per MD Mody for cough
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
## 2015-05-22 NOTE — Progress Notes (Signed)
[name] at [place] NAME: [name]    MR#:  [id number]  DATE OF BIRTH:  09/27/1941  SUBJECTIVE:  Patient has been off of BiPAP since yesterday. Patient refuses to go rehabilitation facility. He says don't ever mentioned that were ordered again. He has no other issues. He is not complaining of dryness of breath or chest pain. REVIEW OF SYSTEMS:    Review of Systems  Constitutional: Negative for fever, chills and malaise/fatigue.  HENT: Negative for sore throat.   Eyes: Negative for blurred vision.  Respiratory: Positive for cough and sputum production. Negative for hemoptysis, shortness of breath and wheezing.   Cardiovascular: Negative for chest pain, palpitations and leg swelling.  Gastrointestinal: Negative for nausea, vomiting, abdominal pain, diarrhea and blood in stool.  Genitourinary: Negative for dysuria.  Musculoskeletal: Negative for back pain.  Neurological: Negative for dizziness, tremors, sensory change, speech change, focal weakness, seizures and headaches.  Endo/Heme/Allergies: Does not bruise/bleed easily.  Marland Kitchen   He is tolerating his diet   DRUG ALLERGIES:   Allergies  Allergen Reactions  . Penicillins Anaphylaxis and Hives  . Demerol [Meperidine] Other (See Comments)    Reaction:  Hallucinations    . Dilaudid [Hydromorphone Hcl] Other (See Comments)    Reaction:  Hallucinations     VITALS:  Blood pressure 107/72, pulse 78, temperature 97.7 F (36.5 C), temperature source Oral, resp. rate 22, height 6\' 2"  (1.88 m), weight 121.246 kg (267 lb 4.8 oz), SpO2 92 %.  PHYSICAL EXAMINATION:   Physical Exam  Constitutional: He is oriented to person, place, and time and well-developed, well-nourished, and in no distress. No distress.  HENT:  Head: Normocephalic.  Eyes: No scleral icterus.  Neck: Normal range of motion. Neck supple. No JVD present. No tracheal deviation present.  Cardiovascular: Normal rate,  regular rhythm and normal heart sounds.  Exam reveals no gallop and no friction rub.   No murmur heard. Pulmonary/Chest: Effort normal and breath sounds normal. No respiratory distress. He has no wheezes. He has no rales. He exhibits no tenderness.  Abdominal: Soft. Bowel sounds are normal. He exhibits no distension and no mass. There is no tenderness. There is no rebound and no guarding.  Musculoskeletal: Normal range of motion. He exhibits no edema.  Neurological: He is alert and oriented to person, place, and time. No cranial nerve deficit. Coordination normal.  Skin: Skin is warm. No rash noted. No erythema.  Psychiatric: Affect normal.      LABORATORY PANEL:   CBC  Recent Labs Lab 05/22/15 0336  WBC 6.5  HGB 7.6*  HCT 24.9*  PLT 157   ------------------------------------------------------------------------------------------------------------------  Chemistries   Recent Labs Lab 05/19/15 0932  05/22/15 0336  NA 138  < > 141  K 4.0  < > 4.5  CL 95*  < > 99*  CO2 35*  < > 34*  GLUCOSE 176*  < > 281*  BUN 6  < > 15  CREATININE 0.82  < > 1.39*  CALCIUM 8.0*  < > 7.2*  AST 16  --   --   ALT 12*  --   --   ALKPHOS 93  --   --   BILITOT 0.4  --   --   < > = values in this interval not displayed. ------------------------------------------------------------------------------------------------------------------  Cardiac Enzymes  Recent Labs Lab 05/20/15 0350  TROPONINI 0.04*   ------------------------------------------------------------------------------------------------------------------  RADIOLOGY:  No results found.   ASSESSMENT AND PLAN:  74 year old male with past medical history of diabetes, CHF, coronary disease, history of previous GI bleed, history of AV malformations, diverticulosis, gastric polyps, hypertension, abdominal aortic aneurysm repair, COPD, anxiety/depression, who presented to the hospital with weakness, shortness of breath and chest  pain.  1 altered mental status/encephalopathy: This is due to hypercarbic respiratory failure.Patient's ABGs consistent with hypoxic hypercarbic respiratory failure. CT head on admission was negative for any acute neurologic process. He is no longer encephalopathic. He appears to be at his baseline.  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  Patient has no wheezing on examination. Steroids will be tapered quickly.   3 CHF-acute on chronic diastolic dysfunction. Patient had echocardiogram last month showing EF of 55-60%. Patient has been seen and evaluated by cardiology. Patient is no longer an CHF exacerbation. Patient has diuresed appropriately. Due to low blood pressure have stopped Lasix. Marland Kitchen  4 elevated troponin : This is due to above issues. This is not acute coronary syndrome. Cardiology did recommend further workup such as stress test however patient adamantly refuses.   5. oral thrush: Patient will continue nystatin swish and swallow.  6 diabetes without complications: Continue Lantus, sliding scale insulin.  7 GERD-continue Protonix.  8 bipolar disorder-continue Depakote. Psychiatry is seeing the patient in consultation. It does appear the patient is able to make his decisions. I will confirm this with Dr. Weber Cooks.  9 anxiety/depression-continue Celexa, Xanax  Physical therapy recommends skilled nursing facility, however patient is adamantly refusing this. Patient will be discharged tomorrow with home health care.   All the records are reviewed and case discussed with Care Management/Social Worker. Management plans discussed with the patient and he is in agreement. CODE STATUS: Full  DVT Prophylaxis: Teds SCDs  TOTAL TIME TAKING CARE OF THIS PATIENT: 25 minutes.   POSSIBLE D/C  tomorrow.  [name] M.D on 05/22/2015 at 11:49 AM  Between 7am to 6pm - Pager - [phone]  After 6pm go to  [email or web address] - password [email or web address]  [place] [facility]  Office  [phone]  CC: Primary care physician; No primary care provider on file.

## 2015-05-22 NOTE — Plan of Care (Signed)
Problem: Phase I Progression Outcomes Goal: Aspirin unless contraindicated Outcome: Completed/Met Date Met:  05/22/15 contraindicated

## 2015-05-23 ENCOUNTER — Inpatient Hospital Stay: Payer: Commercial Managed Care - HMO

## 2015-05-23 LAB — CBC
HCT: 26.2 % — ABNORMAL LOW (ref 40.0–52.0)
HEMOGLOBIN: 8.1 g/dL — AB (ref 13.0–18.0)
MCH: 27.6 pg (ref 26.0–34.0)
MCHC: 30.9 g/dL — AB (ref 32.0–36.0)
MCV: 89.3 fL (ref 80.0–100.0)
Platelets: 162 10*3/uL (ref 150–440)
RBC: 2.94 MIL/uL — ABNORMAL LOW (ref 4.40–5.90)
RDW: 19.9 % — AB (ref 11.5–14.5)
WBC: 5.2 10*3/uL (ref 3.8–10.6)

## 2015-05-23 LAB — BASIC METABOLIC PANEL
Anion gap: 7 (ref 5–15)
BUN: 17 mg/dL (ref 6–20)
CO2: 36 mmol/L — ABNORMAL HIGH (ref 22–32)
Calcium: 7.8 mg/dL — ABNORMAL LOW (ref 8.9–10.3)
Chloride: 99 mmol/L — ABNORMAL LOW (ref 101–111)
Creatinine, Ser: 1.2 mg/dL (ref 0.61–1.24)
GFR calc non Af Amer: 58 mL/min — ABNORMAL LOW (ref >60)
GLUCOSE: 214 mg/dL — AB (ref 65–99)
POTASSIUM: 4.5 mmol/L (ref 3.5–5.1)
Sodium: 142 mmol/L (ref 135–145)

## 2015-05-23 LAB — GLUCOSE, CAPILLARY
GLUCOSE-CAPILLARY: 301 mg/dL — AB (ref 65–99)
Glucose-Capillary: 209 mg/dL — ABNORMAL HIGH (ref 65–99)

## 2015-05-23 MED ORDER — LEVOFLOXACIN 750 MG PO TABS: 750.0000 mg | ORAL_TABLET | Freq: Every day | ORAL | Status: DC

## 2015-05-23 MED ORDER — NYSTATIN 100000 UNIT/ML MT SUSP: 5.0000 mL | Freq: Four times a day (QID) | OROMUCOSAL | Status: DC

## 2015-05-23 MED ORDER — METOPROLOL TARTRATE 25 MG PO TABS: 25.0000 mg | ORAL_TABLET | Freq: Two times a day (BID) | ORAL | Status: DC

## 2015-05-23 MED ORDER — PREDNISONE 10 MG (21) PO TBPK: 10.0000 mg | ORAL_TABLET | Freq: Every day | ORAL | Status: DC

## 2015-05-23 NOTE — Progress Notes (Signed)
Patient refused bipap during the night. Will contineu to monitor patient.

## 2015-05-23 NOTE — Progress Notes (Signed)
Pt is refusing Bipap. Pt wishes to remain on 6L [place]. Encouraged pt to wear Bipap but continues to refuse

## 2015-05-23 NOTE — Discharge Summary (Signed)
[place] at [place] NAME: [name]    MR#:  [id number]  DATE OF BIRTH:  21-Nov-1940  DATE OF ADMISSION:  05/19/2015 ADMITTING PHYSICIAN: Max Sane, MD  DATE OF DISCHARGE: 05/23/2015   PRIMARY CARE PHYSICIAN: No primary care provider on file.    ADMISSION DIAGNOSIS:  Tachycardia [R00.0] Ischemic chest pain [I20.9]  DISCHARGE DIAGNOSIS:  Principal Problem:   Acute respiratory distress Active Problems:   Unstable angina   Ischemic chest pain   Tachycardia   Bronchitis   Acute on chronic diastolic CHF (congestive heart failure)   Bipolar disorder, in partial remission, most recent episode mixed   SECONDARY DIAGNOSIS:   Past Medical History  Diagnosis Date  . Allergic rhinitis, cause unspecified   . Other and unspecified hyperlipidemia   . 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 Unspecified essential hypertension   . 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 Severe bullous disease of LUL. Prolonged intubation after surgeries due to COPD  . 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 Anemia     Recurrent microcytic, presumably GI   . Complication of anesthesia     trouble getting off ventilator  . Memory loss   . Diabetes   . CHF (congestive heart failure)   . CAD (coronary artery disease)   . GI bleed requiring more than 4 units of blood in 24 hours, ICU, or surgery     Hx bleeding gastric polyps, cecal & sigmoid AVMS s/p APC 03/30/14  . AVM (arteriovenous malformation) of colon with hemorrhage   . Multiple gastric polyps   . Diverticulosis   . Essential hypertension 08/18/2009    Qualifier: Diagnosis of  By: Doy Mince LPN, Megan    . Diastolic HF (heart failure) 03/27/2012  . Anxiety state 09/10/2013  . Depression 01/14/2013  . Vitamin D deficiency  08/10/2014  . Leucocytosis 12/04/2013  . Insomnia 08/10/2014  . Bipolar 1 disorder, mixed, moderate 04/16/2015    HOSPITAL COURSE:  74 year old male with past medical history of diabetes, CHF, coronary disease, history of previous GI bleed, history of AV malformations, diverticulosis, gastric polyps, hypertension, abdominal aortic aneurysm repair, COPD, anxiety/depression, who presented to the hospital with weakness, shortness of breath and chest pain.  1 altered mental status/encephalopathy: This was due to hypercarbic respiratory failure.Patient's ABG was consistent with hypoxic hypercarbic respiratory failure. CT head on admission was negative for any acute neurologic process. He is no longer encephalopathic. He appears to be at his baseline.  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 Patient has no wheezing on examination. He will be discharged on steroids taper.  3 CHF-acute on chronic diastolic dysfunction. Patient had echocardiogram last month showing EF of 55-60%. Patient has been seen and evaluated by cardiology. Patient is no longer an CHF exacerbation. Patient has diuresed appropriately.   4 elevated troponin : This is due to above issues. This is not acute coronary syndrome. Cardiology did recommend further workup such as stress test however patient adamantly refuses.   5. oral thrush: Patient will continue nystatin swish and swallow. He was also evaluated by speech. He does show minor signs of aspiration and they recommend aspiration precautions and to take his pills with applesauce. He was  given these instructions.   6 diabetes without complications: Continue Lantus, sliding scale insulin.  7 GERD-continue Protonix.  8 bipolar disorder-continue Depakote. Patient was seen by psychiatry. They do not recommend further psychiatric care in the behavioral health unit. He is able to make his own decisions as  per psychiatry.  9 anxiety/depression-continue Celexa, Xanax  Physical therapy recommends skilled nursing facility, however patient is adamantly refusing this. Patient will be discharged  with home health care.    DISCHARGE CONDITIONS AND DIET:  Patient's being discharged in stable condition on a heart healthy/diabetic diet  CONSULTS OBTAINED:  Treatment Team:  Max Sane, MD Minna Merritts, MD Gonzella Lex, MD  DRUG ALLERGIES:   Allergies  Allergen Reactions  . Penicillins Anaphylaxis and Hives  . Demerol [Meperidine] Other (See Comments)    Reaction:  Hallucinations    . Dilaudid [Hydromorphone Hcl] Other (See Comments)    Reaction:  Hallucinations     DISCHARGE MEDICATIONS:   Current Discharge Medication List    START taking these medications   Details  levofloxacin (LEVAQUIN) 750 MG tablet Take 1 tablet (750 mg total) by mouth daily. Qty: 5 tablet, Refills: 0    metoprolol tartrate (LOPRESSOR) 25 MG tablet Take 1 tablet (25 mg total) by mouth 2 (two) times daily. Qty: 60 tablet, Refills: 0    nystatin (MYCOSTATIN) 100000 UNIT/ML suspension Use as directed 5 mLs (500,000 Units total) in the mouth or throat 4 (four) times daily. Qty: 60 mL, Refills: 0    predniSONE (STERAPRED UNI-PAK 21 TAB) 10 MG (21) TBPK tablet Take 1 tablet (10 mg total) by mouth daily. Label  & dispense according to the schedule below: 60 mg PO x 2 days 50 mg PO x 2 days 40 mg PO x 2 days 30 mg PO x 2 days 20 mg PO x 2 days 10 mg PO x 2 days then stop Qty: 45 tablet, Refills: 0      CONTINUE these medications which have NOT CHANGED   Details  acetaminophen (TYLENOL) 325 MG tablet Take 2 tablets (650 mg total) by mouth every 6 (six) hours as needed for mild pain (or Fever >/= 101).    albuterol (PROVENTIL HFA;VENTOLIN HFA) 108 (90 BASE) MCG/ACT inhaler Inhale 2 puffs into the lungs every 6 (six) hours as needed for wheezing or shortness of breath. Qty: 1 Inhaler, Refills: 2     ALPRAZolam (XANAX) 0.5 MG tablet Take 1 tablet (0.5 mg total) by mouth 3 (three) times daily as needed for anxiety (for shortness of breath). Qty: 60 tablet, Refills: 0    atorvastatin (LIPITOR) 40 MG tablet Take 1 tablet (40 mg total) by mouth at bedtime. <please make appointment for refills> Qty: 30 tablet, Refills: 0    budesonide-formoterol (SYMBICORT) 160-4.5 MCG/ACT inhaler Inhale 2 puffs into the lungs 2 (two) times daily. Qty: 1 Inhaler, Refills: 2    citalopram (CELEXA) 40 MG tablet Take 1 tablet (40 mg total) by mouth daily. Qty: 30 tablet, Refills: 0    divalproex (DEPAKOTE) 500 MG DR tablet Take 1 tablet (500 mg total) by mouth at bedtime. Qty: 30 tablet, Refills: 0    docusate sodium (COLACE) 100 MG capsule Take 1 capsule (100 mg total) by mouth 2 (two) times daily. Qty: 60 capsule, Refills: 0    ferrous sulfate 325 (65 FE) MG tablet Take 2 tablets (650 mg total) by mouth 3 (three) times daily with meals. Qty: 90 tablet, Refills: 0    folic acid (  FOLVITE) 1 MG tablet Take 2 tablets (2 mg total) by mouth every morning. Need appointment before anymore refills Qty: 60 tablet, Refills: 0    insulin aspart (NOVOLOG) 100 UNIT/ML injection Inject 10 Units into the skin 3 (three) times daily with meals. Qty: 10 mL, Refills: 11    insulin glargine (LANTUS) 100 UNIT/ML injection Inject 0.17 mLs (17 Units total) into the skin 2 (two) times daily. Qty: 10 mL, Refills: 11    pantoprazole (PROTONIX) 40 MG tablet Take 1 tablet (40 mg total) by mouth 2 (two) times daily. Qty: 60 tablet, Refills: 0    sucralfate (CARAFATE) 1 G tablet Take 1 tablet (1 g total) by mouth 4 (four) times daily -  with meals and at bedtime. Qty: 120 tablet, Refills: 0    tiotropium (SPIRIVA) 18 MCG inhalation capsule Place 1 capsule (18 mcg total) into inhaler and inhale daily. Qty: 30 capsule, Refills: 0    Vitamin D, Ergocalciferol, (DRISDOL) 50000 UNITS CAPS capsule Take 1 capsule (50,000 Units  total) by mouth every 7 (seven) days. Pt takes on Monday. Qty: 12 capsule, Refills: 0    zolpidem (AMBIEN) 5 MG tablet TAKE 1 TABLET BY MOUTH AT BEDTIME AS NEEDED FOR SLEEP Qty: 30 tablet, Refills: 0    traZODone (DESYREL) 50 MG tablet Take 1 tablet (50 mg total) by mouth 3 (three) times daily. Qty: 90 tablet, Refills: 0      STOP taking these medications     lisinopril (PRINIVIL,ZESTRIL) 5 MG tablet               Today   CHIEF COMPLAINT:  Patient is doing fine this morning. Patient wants to go home. Patient seems to be at his base). Patient denies chest pain, shortness of breath or respiratory issues.   VITAL SIGNS:  Blood pressure 116/67, pulse 94, temperature 98.3 F (36.8 C), temperature source Oral, resp. rate 19, height 6\' 2"  (1.88 m), weight 123.877 kg (273 lb 1.6 oz), SpO2 96 %.   REVIEW OF SYSTEMS:  Review of Systems  Constitutional: Negative for fever, chills and malaise/fatigue.  HENT: Negative for sore throat.   Eyes: Negative for blurred vision.  Respiratory: Negative for cough, hemoptysis, shortness of breath and wheezing.   Cardiovascular: Negative for chest pain, palpitations and leg swelling.  Gastrointestinal: Negative for nausea, vomiting, abdominal pain, diarrhea and blood in stool.  Genitourinary: Negative for dysuria.  Musculoskeletal: Negative for back pain.  Neurological: Negative for dizziness, tremors and headaches.  Endo/Heme/Allergies: Does not bruise/bleed easily.  Psychiatric/Behavioral: Negative for hallucinations. The patient is nervous/anxious.      PHYSICAL EXAMINATION:  GENERAL:  74 y.o.-year-old patient lying in the bed with no acute distress.  NECK:  Supple, no jugular venous distention. No thyroid enlargement, no tenderness.  LUNGS: Normal breath sounds bilaterally, no wheezing, rales,rhonchi  No use of accessory muscles of respiration.  CARDIOVASCULAR: S1, S2 normal. No murmurs, rubs, or gallops.  ABDOMEN: Soft,  non-tender, non-distended. Bowel sounds present. No organomegaly or mass.  EXTREMITIES: No pedal edema, cyanosis, or clubbing.  PSYCHIATRIC: The patient is alert and oriented x 3.  SKIN: No obvious rash, lesion, or ulcer.   DATA REVIEW:   CBC  Recent Labs Lab 05/23/15 0832  WBC 5.2  HGB 8.1*  HCT 26.2*  PLT 162    Chemistries   Recent Labs Lab 05/19/15 0932  05/23/15 0832  NA 138  < > 142  K 4.0  < > 4.5  CL 95*  < >  99*  CO2 35*  < > 36*  GLUCOSE 176*  < > 214*  BUN 6  < > 17  CREATININE 0.82  < > 1.20  CALCIUM 8.0*  < > 7.8*  AST 16  --   --   ALT 12*  --   --   ALKPHOS 93  --   --   BILITOT 0.4  --   --   < > = values in this interval not displayed.  Cardiac Enzymes  Recent Labs Lab 05/19/15 1533 05/19/15 2057 05/20/15 0350  TROPONINI 0.04* 0.04* 0.04*    Microbiology Results  @MICRORSLT48 @  RADIOLOGY:  Dg Chest 1 View  05/23/2015   CLINICAL DATA:  Hypoxia  EXAM: CHEST  1 VIEW  COMPARISON:  05/19/2015  FINDINGS: Enlarged cardiac silhouette stable. Stable left lower lobe consolidation. Vascular pattern normal. Bullous change left apex stable. Mild central bronchitic change stable. Status post median sternotomy/ CABG.  IMPRESSION: Continued retrocardiac opacification unchanged from prior study. No significant change when compared to 05/19/2015.   Electronically Signed   By: Skipper Cliche M.D.   On: 05/23/2015 08:52      Management plans discussed with the patient and hes in agreement. Stable for discharge home with home health care  Patient should follow up with PCP in one week  CODE STATUS:     Code Status Orders        Start     Ordered   05/19/15 1500  Full code   Continuous     05/19/15 1459      TOTAL TIME TAKING CARE OF THIS PATIENT: 35 minutes.    Ravinder Hofland M.D on 05/23/2015 at 11:20 AM  Between 7am to 6pm - Pager - [phone] After 6pm go to [email or web address] - password [email or web address]  [place] [facility]  Office   [phone]  CC: Primary care physician; No primary care provider on file.

## 2015-05-23 NOTE — Care Management Note (Signed)
Case Management Note  Patient Details  Name: LANIER MILLON MRN: [id number] Date of Birth: 1940/10/25  Subjective/Objective:       Referral faxed and called to [facility] requesting home health PT and RN. Discussed with [name] at [facility].per frequent readmissions.             Action/Plan:   Expected Discharge Date:                  Expected Discharge Plan:     In-House Referral:     Discharge planning Services     Post Acute Care Choice:    Choice offered to:     DME Arranged:    DME Agency:     HH Arranged:    [place] Agency:     Status of Service:     Medicare Important Message Given:  Yes-second notification given Date Medicare IM Given:    Medicare IM give by:    Date Additional Medicare IM Given:    Additional Medicare Important Message give by:     If discussed at [place] of Stay Meetings, dates discussed:    Additional Comments:  Saloma Cadena A, RN 05/23/2015, 10:47 AM

## 2015-05-23 NOTE — Progress Notes (Signed)
MD Mody aware pt will not be seen by Physical Therapy before DC, and also this patient is on 4 liters chronic and will not be weaned to room air
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
## 2015-05-23 NOTE — Progress Notes (Signed)
Resume Home Health with [facility]. Home Health PT and RN. V.O. Dr Jennell Corner, RN, BSN, CM 05/23/15 @ 11am

## 2015-05-25 ENCOUNTER — Other Ambulatory Visit: Payer: Self-pay | Admitting: *Deleted

## 2015-05-25 NOTE — Patient Outreach (Signed)
Transition of care call: Spoke with pt, HIPPA verified.  Discussed with pt reason for call, request to f/u post hosptial discharge  with [facility] services.  Pt gave the phone to [place] from [place] to which nurse states pt does not understand reason for call, nurse currently in the middle of assessment.  This RN CM states she will f/u with pt tomorrow.     Plan to call pt again tomorrow, complete transition of care.   Zara Chess.   [place] [facility]  [phone]

## 2015-05-26 ENCOUNTER — Other Ambulatory Visit: Payer: Self-pay | Admitting: *Deleted

## 2015-05-26 DIAGNOSIS — J9601 Acute respiratory failure with hypoxia: Secondary | ICD-10-CM

## 2015-05-26 NOTE — Patient Outreach (Signed)
Transition of care call (week 1):  Called pt's home and  person answering states I am  on the phone now, try back later.  Plan to call pt back later today as was unable to complete transition of care yesterday [facility] RN doing a home assessment).    Zara Chess.   [place] [facility]  [phone]

## 2015-05-26 NOTE — Patient Outreach (Signed)
Transition of care:  Spouse [name] answered the phone,states pt is not feeling good.   Permission granted from pt (HIPPA verified) to speak to spouse. RN CM discussed with spouse [facility] services, plan to f/u with weekly phone calls 30 days post discharge.  Spouse states not feeling well, feels been overmedicated.  Spouse states pt is too weak to walk down the hall, stomach bothering him, swelling in feet/cold.   Spouse states 911 was called 5 times in the last 2 weeks,  trying to keep pt at home.  Spouse states pt was discharged home on insulin- suppose to give him  4 shots a day.  Spouse states she has never given insulin before, talked to [facility] RN yesterday -  to continue to give pt  Metformin 500 mg daily- want to talk to MD first.   Spouse states pt is to see Dr. Ouida Sills (new Primary Care MD) tomorrow.  Spouse reports HH RN can provide services once set up with new Primary Care MD..  Spouse states pt's blood sugar this am was 195- just ate peaches 1/2 hour earlier.  Spouse reports  pt did not take all of his morning medications (stomach bothering him), did take antibiotic, Prednisone, inhalers.  RN CM discussed with spouse doing a home visit  8/11 as well as f/u telephonically on 8/16 to which spouse agreed.     Plan to f/u with pt again on 8/11- home visit.    Zara Chess.   [place] [facility]  [phone]

## 2015-05-28 ENCOUNTER — Other Ambulatory Visit: Payer: Self-pay | Admitting: *Deleted

## 2015-05-28 ENCOUNTER — Encounter: Payer: Self-pay | Admitting: *Deleted

## 2015-05-28 VITALS — BP 120/70 | HR 100 | Resp 20 | Ht 74.0 in | Wt 264.0 lb

## 2015-05-28 DIAGNOSIS — J9601 Acute respiratory failure with hypoxia: Secondary | ICD-10-CM

## 2015-05-29 ENCOUNTER — Encounter: Payer: Self-pay | Admitting: *Deleted

## 2015-05-29 NOTE — Patient Outreach (Signed)
[facility]) Care Management   05/29/2015  [name] 02-11-1941 [id number]  [name] is an 74 y.o. male   Acute home visit:  Subjective:  Pt reports he f/u with Dr. Ouida Sills, MD gave him a rx for Hydrocodone- chronic back Pain.  Pt states he took his medications today.  Spouse states received a call from MD office today,  Reported sugar high, instructed to increase Lantus insulin to 20 units daily, pt not using Novolog  Insulin now, still taking Metformin.  Spouse states she was shown  how to give pt insulin, no problems.  Spouse states she did not check pt's sugar today.  Pt states he fell yesterday, lost balance, affected Right arm/lower extremity, no pain today.  Pt states he feels better today, took Benadryl for his  Allergies.   Objective:  Had spouse check pt's sugar during home visit- result 389 (recently had peanut butter/                     Mayonnaise sandwhich, 3 glasses of sweet tea).  Filed Vitals:   05/28/15 1503  BP: 120/70  Pulse: 100  Resp: 20    ROS  Physical Exam  Constitutional: He is oriented to person, place, and time. He appears well-developed and well-nourished.  Cardiovascular: Normal rate and regular rhythm.   Respiratory:  Slight diminished sounds in all lobes  posteriorly  GI: Soft.  Musculoskeletal: He exhibits edema.  Trace edema left ankle, top of foot   Neurological: He is alert and oriented to person, place, and time.  Skin: Skin is warm and dry.  Bruising noted in right arm- pt states from IV's in hospital.   Psychiatric: He has a normal mood and affect. His behavior is normal. Judgment and thought content normal.    Current Medications:   Current Outpatient Prescriptions  Medication Sig Dispense Refill  . albuterol (PROVENTIL HFA;VENTOLIN HFA) 108 (90 BASE) MCG/ACT inhaler Inhale 2 puffs into the lungs every 6 (six) hours as needed for wheezing or shortness of breath. 1 Inhaler 2  . ALPRAZolam (XANAX) 0.5  MG tablet Take 1 tablet (0.5 mg total) by mouth 3 (three) times daily as needed for anxiety (for shortness of breath). 60 tablet 0  . atorvastatin (LIPITOR) 40 MG tablet Take 1 tablet (40 mg total) by mouth at bedtime. <please make appointment for refills> 30 tablet 0  . budesonide-formoterol (SYMBICORT) 160-4.5 MCG/ACT inhaler Inhale 2 puffs into the lungs 2 (two) times daily. 1 Inhaler 2  . citalopram (CELEXA) 40 MG tablet Take 1 tablet (40 mg total) by mouth daily. 30 tablet 0  . divalproex (DEPAKOTE) 500 MG DR tablet Take 1 tablet (500 mg total) by mouth at bedtime. 30 tablet 0  . docusate sodium (COLACE) 100 MG capsule Take 1 capsule (100 mg total) by mouth 2 (two) times daily. 60 capsule 0  . ferrous sulfate 325 (65 FE) MG tablet Take 2 tablets (650 mg total) by mouth 3 (three) times daily with meals. 90 tablet 0  . folic acid (FOLVITE) 1 MG tablet Take 2 tablets (2 mg total) by mouth every morning. Need appointment before anymore refills 60 tablet 0  . HYDROcodone-acetaminophen (NORCO/VICODIN) 5-325 MG per tablet Take 1 tablet by mouth every 6 (six) hours as needed for moderate pain.    Marland Kitchen insulin glargine (LANTUS) 100 UNIT/ML injection Inject 0.17 mLs (17 Units total) into the skin 2 (two) times daily. 10 mL 11  . metFORMIN (  GLUCOPHAGE) 500 MG tablet Take 500 mg by mouth daily.    . metoprolol tartrate (LOPRESSOR) 25 MG tablet Take 1 tablet (25 mg total) by mouth 2 (two) times daily. 60 tablet 0  . pantoprazole (PROTONIX) 40 MG tablet Take 1 tablet (40 mg total) by mouth 2 (two) times daily. 60 tablet 0  . potassium chloride (K-DUR,KLOR-CON) 10 MEQ tablet Take 10 mEq by mouth daily.    . predniSONE (STERAPRED UNI-PAK 21 TAB) 10 MG (21) TBPK tablet Take 1 tablet (10 mg total) by mouth daily. Label  & dispense according to the schedule below: 60 mg PO x 2 days 50 mg PO x 2 days 40 mg PO x 2 days 30 mg PO x 2 days 20 mg PO x 2 days 10 mg PO x 2 days then stop 45 tablet 0  . sucralfate  (CARAFATE) 1 G tablet Take 1 tablet (1 g total) by mouth 4 (four) times daily -  with meals and at bedtime. 120 tablet 0  . tiotropium (SPIRIVA) 18 MCG inhalation capsule Place 1 capsule (18 mcg total) into inhaler and inhale daily. 30 capsule 0  . torsemide (DEMADEX) 20 MG tablet Take 20 mg by mouth daily.    . traZODone (DESYREL) 50 MG tablet Take 1 tablet (50 mg total) by mouth 3 (three) times daily. 90 tablet 0  . Vitamin D, Ergocalciferol, (DRISDOL) 50000 UNITS CAPS capsule Take 1 capsule (50,000 Units total) by mouth every 7 (seven) days. Pt takes on Monday. 12 capsule 0  . zolpidem (AMBIEN) 5 MG tablet TAKE 1 TABLET BY MOUTH AT BEDTIME AS NEEDED FOR SLEEP 30 tablet 0  . acetaminophen (TYLENOL) 325 MG tablet Take 2 tablets (650 mg total) by mouth every 6 (six) hours as needed for mild pain (or Fever >/= 101). (Patient not taking: Reported on 05/26/2015)    . insulin aspart (NOVOLOG) 100 UNIT/ML injection Inject 10 Units into the skin 3 (three) times daily with meals. (Patient not taking: Reported on 05/26/2015) 10 mL 11  . levofloxacin (LEVAQUIN) 750 MG tablet Take 1 tablet (750 mg total) by mouth daily. (Patient not taking: Reported on 05/28/2015) 5 tablet 0  . nystatin (MYCOSTATIN) 100000 UNIT/ML suspension Use as directed 5 mLs (500,000 Units total) in the mouth or throat 4 (four) times daily. (Patient not taking: Reported on 05/26/2015) 60 mL 0   No current facility-administered medications for this visit.    Functional Status:   In your present state of health, do you have any difficulty performing the following activities: 05/28/2015 05/28/2015  Hearing? - N  Vision? (No Data) Y  Difficulty concentrating or making decisions? - Y  Walking or climbing stairs? - Y  Dressing or bathing? - Y  Doing errands, shopping? - Y  Preparing Food and eating ? - Y  Using the Toilet? - N  In the past six months, have you accidently leaked urine? - N  Do you have problems with loss of bowel control? - N   Managing your Medications? - N  Managing your Finances? - Y  Housekeeping or managing your Housekeeping? - N    Fall/Depression Screening:    PHQ 2/9 Scores 05/28/2015 07/11/2014  PHQ - 2 Score 2 1  PHQ- 9 Score 2 -    Assessment:  Ongoing transition of care-                          COPD:   lungs clear  anteriorly, slightly diminished in all fields posteriorly.  O2 sat after                          Ambulating short distance to bathroom 84%, O2 off.  Rechecked with O2 4 LNC- 94%.                            Reports took medications this morning (spouse prepares pill planner)                         DM:  Blood sugar during home visit 389 (recently ate, plus had 3 glasses of sweet tea.                                  Need for spouse to check sugar more often (twice a day), compliance with diabetic                                  Diet + currently taking Prednisone taper.                          Plan:      Pt to keep f/u appointment with Dr. Ouida Sills.                Pt to switch from sweet tea to unsweetened, add Splenda                Spouse to check pt's blood sugars twice a day/record in [facility] calendar provided/bring                   To next MD appointment                RN CM to inform Dr. Ouida Sills of [name] involvement- sending  letter and  encounter.                 Discussed with pt plan to have coworkers covering for this RN CM (weeks of 8/15, 8/22) to  provide                     Weekly phones as part of ongoing transition of care.     [facility] CM Care Plan Problem One        Patient Outreach from 05/28/2015 in [place] Problem One  Potential for readmission related to recent hospitalization for acute respiratory disease (7 admissions in 6 months)   Care Plan for Problem One  Active   [facility] Long Term Goal (31-90 days)  Pt would not readmit in the next 31 days    [facility] Long Term Goal Start Date  05/26/15   Interventions for Problem One Long Term Goal  Discussed  with spouse importance of pt f/u with MD post discharge, compliance with meds.     [facility] CM Short Term Goal #1 (0-30 days)  Pt would keep appointment with Dr. Ouida Sills - pt scheduled to see MD tomorrow 8/10.    [facility] CM Short Term Goal #1 Start Date  05/26/15   [facility] CM Short Term Goal #1 Met Date  05/28/15   Interventions for Short Term Goal #1  Reinforced with spouse importance of  f/u with MD post discharge, to  bring all meds, discharge papers to appointment   [facility] CM Short Term Goal #2 (0-30 days)  Pt would be compliant with medications as ordered in the next 7 days    [facility] CM Short Term Goal #2 Start Date  05/26/15   [facility] CM Short Term Goal #2 Met Date  05/28/15   Interventions for Short Term Goal #2  Reviewed pt's  discharge meds with spouse- discussed importance of compliance,need to  talk to MD about insulin ordered.     [place] Problem Two        Patient Outreach from 05/28/2015 in [place] Problem Two  elevated blood sugars    Care Plan for Problem Two  Active   [facility] CM Short Term Goal #1 (0-30 days)  pt blood sugars would be under 200 within the next 30 days    [facility] CM Short Term Goal #1 Start Date  05/28/15   Interventions for Short Term Goal #2   Discussed with pt/spouse importance of checking blood sugars often, avoid sweet tea       Zara Chess.   [place] Care Management  [phone]

## 2015-06-02 ENCOUNTER — Other Ambulatory Visit: Payer: Self-pay | Admitting: *Deleted

## 2015-06-02 ENCOUNTER — Ambulatory Visit: Payer: Self-pay | Admitting: *Deleted

## 2015-06-02 NOTE — Patient Outreach (Signed)
Transition of care call (week 2):  Received a return phone call from spouse [name] (on [facility] consent form) to voice message left earlier.  Spouse state pt got back from seeing  NP at Dr. Kenney Houseman (GI MD)office, told pt's hemoglobin up to a 9.  Spouse states pt to be set up to have recheck of polyps in stomach.  Spouse states pt's blood sugars better, today 155 am, checking pt's sugars twice a day- ranges 228-486 (highest 8/12).   Spouse states HH PT came today but pt was not home, scheduled to come back tomorrow.   Spouse states pt's breathing is all right, pt looks swollen (monitoring his salt intake).   Discussed with spouse plan for coworker RN covering for this RN CM to do f/u call next week (part of ongoing transition of care).     Zara Chess.   [place] [facility]  [phone]

## 2015-06-02 NOTE — Patient Outreach (Signed)
Attempt made to contact pt as part of ongoing transition of care/scheduled f/u phone call (week 2 post discharge).   HIPPA compliant voice message left with contact number.   If no response to call today, will relay to  RN coworker covering for this RN CM to call again next week.      Zara Chess.   [place] Care Management  [phone]

## 2015-06-03 ENCOUNTER — Encounter: Payer: Self-pay | Admitting: *Deleted

## 2015-06-03 NOTE — Patient Outreach (Signed)
[name]) Care Management  06/03/2015  [name] September 13, 1941 [id number]   Documentation:  Late entry on acute visit done 8/12.   This RN CM went  to pt's home after not being able to contact via phone. Pt reports phone number changed back to old number- [phone] which RN CM will update.    Spouse states pt's blood sugar this am was 279, received a call from MD office today to  increase pt's Lantus insulin to 20 units daily which spouse did give.  Spouse reports pt ate 2 hours ago (pimento cheese, banana,pickle sandwich) did not recheck his sugar yet.  RN CM requested spouse recheck sugar during visit, result was 486.   RN CM called MD office, closed- informed of walk in clinic for the weekend to which was relayed to spouse.  Spouse states she will not take pt to walk in clinic, go to ED if needed.   Spouse later informed RN CM pt had 3 sandwiches today, believe he could have had ice cream, had ice cream with chocolate syrup last night.  Spouse also reports pt had a pack of graham crackers, likes to eat.   RN CM discussed with  spouse to recheck pt's blood sugar,then continue to check twice a day- if still elevated, f/u at ED over weekend, call MD 8/15 to which spouse voiced understanding.    Plan to f/u telephonically 8/16 as part of ongoing transition of care.    Zara Chess.   [place] [facility] Management  [phone]

## 2015-06-09 ENCOUNTER — Other Ambulatory Visit: Payer: Self-pay | Admitting: *Deleted

## 2015-06-09 NOTE — Patient Outreach (Signed)
Transition of care #3 - Pt was discharged from [facility] on 05/23/15. He is a very complex patient. He reported to me that he is more SOB and has some edema in his feet. He also reported that he initially weighed 264 when he came home and today he weighs 278. He reports his glucose was 302 this morning. He wasn't sure about his MD appt so I called and found that he is supposed to establish with a new primary care provider on Friday at 11:45 am (Dr. Ouida Sills, at the [facility]). I told them my concern about his weight gain and the fact that he is not on any diuretic medication or beta blocker. His ACE was discontinued.  He is getting home health but doesn't know the agency. I found that it is [facility] and I called them and found that his nurse is to come out tomorrow. I also reported my concerns about his weight gain.  I called the pt back to report these things. He became very impatient with me and decided to end the phone call. I did tell him that Kalman Shan would be calling him next week. This pt is very high risk for readmission.  Deloria Lair [facility] [phone]

## 2015-06-10 ENCOUNTER — Encounter: Payer: Self-pay | Admitting: *Deleted

## 2015-06-10 NOTE — Patient Outreach (Signed)
[facility]) Care Management  06/10/2015  [name] 14-Nov-1940 [id number]   I received a phone message from Dr. Tonette Bihari office reporting that they received my message that the pt has been ordered a diuretic, prednisone and antibiotics.  I also received a phone call from [place], LPN, from [facility] home health, who saw the pt today. She reports the pt is very difficult and tries to pick a fight each time she comes to visit. He is disrespectful to her. Today, she was not even able to do her complete assessment. Pt's wife reports he is noncompliant with his diet. Of note, she is diabetic and does all the cooking. Ms. Lucita Lora reports today the pt is somewhat SOB and does have edema. She says the PT is to visit Thursday and Friday this week.

## 2015-06-17 ENCOUNTER — Other Ambulatory Visit: Payer: Self-pay | Admitting: *Deleted

## 2015-06-17 NOTE — Patient Outreach (Signed)
Transition of care call (week 4):  Spoke with pt, reports blood sugar not good today- 520 something this morning, just had coffee and Splenda.  Pt states had a craving for sweet last night, had sugar free ice cream and home made blueberry pie, might be reason sugar was high this morning.  Pt states yesterday sugar was 329. Pt reports spouse either he or spouse check blood sugar twice a day, spouse records.  Pt states spouse gives him short term insulin during the day, long term at night.  Pt states has swelling in feet/ankles/calves.  RN CM inquired of pt if picked up new meds (diurectic,Prednisone, antibiotic) Dr. Ouida Sills recently ordered to which pt said medications were picked up, takes all the medications spouse puts in pill box.  Pt states scheduled to f/u with Dr. Ouida Sills in 2 weeks, can go earlier if needed. Pt thinks  [place] RN coming out tomorrow.  Pt  Reports taking 3 Hydrocodone a day, MD gave him a rx for Tylenol- will  fill if needed.  Pt reports  not weighed, compared to MD's scale is  10 lbs more on his.  Pt weighed during phone call, pt reports weight is 282 lbs, which means he is 272 lbs.  RN CM discussed monitoring intake of sweets to which pt states he has been good, eating smaller portions, drinking water, just had a craving yesterday for sweet.  RN CM discussed with pt plan to f/u again telephonically, scheduled call for  9/7 (final transition of care call).      Zara Chess.   [facility]  [phone]

## 2015-06-24 ENCOUNTER — Other Ambulatory Visit: Payer: Self-pay | Admitting: *Deleted

## 2015-06-24 NOTE — Patient Outreach (Signed)
Transition of care (final call): Spoke with spouse Virginia(on [facility] consent form), reports pt not feeling well today, can't make it to MD appointment with Dr. Ouida Sills.  Spouse states called MD office, cancelled appointment, did not reschedule, will call when pt able to go in.  Spouse states pt's blood sugar is improved, 214 today, yesterday was 500- got into some stuff. Spouse states pt is to take 10 units of fast acting insulin (Novolog), gave him 13 units and sugar came down from 500 to 377.  Spouse states another nurse called today, asked her to check pt's blood sugar because pt was not feeling well, result was 214. Spouse reports on pt's sugars- occassional 200's, 300,400. Spouse states pt is retaining fluid, pt  feels like he is loosing blood which is reason why he should have gone in to see MD today.  Spouse states HH RN from [employer] came out yesterday to which RN CM suggested to call her, let her know about pt's concern for loosing blood, nurse can obtain order from MD to draw labs.  Pt states found number for nurse Vaughan Basta, will call her.  Discussed with spouse plan to f/u with pt again - home visit scheduled for 9/13. Note- transition of care completed today, RN CM to  provide community case management (home visit 9/13), assist with self management of Diabetes.

## 2015-06-29 ENCOUNTER — Other Ambulatory Visit: Payer: Self-pay | Admitting: *Deleted

## 2015-06-29 NOTE — Patient Outreach (Signed)
Received a call from Lelon Frohlich [facility] RN CM), states will be closing pt's case in the next couple of days, was following pt for transition of care.  Lelon Frohlich states last call was on 8/25, spoke with spouse.   Ann relayed to this RN CM was told by another RN CM that  Dr. Inda Merlin is to come out and see pt tomorrow at 1pm which is the same time RN CM has scheduled home visit.     Home visit scheduled for 9/13.     Zara Chess.   [facility]  [phone]

## 2015-06-30 ENCOUNTER — Inpatient Hospital Stay: Payer: Commercial Managed Care - HMO

## 2015-06-30 ENCOUNTER — Inpatient Hospital Stay
Admission: EM | Admit: 2015-06-30 | Discharge: 2015-07-03 | DRG: 292 | Disposition: A | Discharge status: Home / Self Care | Payer: Commercial Managed Care - HMO | Attending: Internal Medicine | Admitting: Internal Medicine

## 2015-06-30 ENCOUNTER — Emergency Department: Payer: Commercial Managed Care - HMO

## 2015-06-30 ENCOUNTER — Ambulatory Visit: Payer: Self-pay | Admitting: *Deleted

## 2015-06-30 DIAGNOSIS — Z87891 Personal history of nicotine dependence: Secondary | ICD-10-CM

## 2015-06-30 DIAGNOSIS — J449 Chronic obstructive pulmonary disease, unspecified: Secondary | ICD-10-CM | POA: Diagnosis present

## 2015-06-30 DIAGNOSIS — I2609 Other pulmonary embolism with acute cor pulmonale: Secondary | ICD-10-CM

## 2015-06-30 DIAGNOSIS — J9610 Chronic respiratory failure, unspecified whether with hypoxia or hypercapnia: Secondary | ICD-10-CM | POA: Diagnosis present

## 2015-06-30 DIAGNOSIS — Z79899 Other long term (current) drug therapy: Secondary | ICD-10-CM | POA: Diagnosis not present

## 2015-06-30 DIAGNOSIS — Z888 Allergy status to other drugs, medicaments and biological substances status: Secondary | ICD-10-CM

## 2015-06-30 DIAGNOSIS — Z880 Allergy status to penicillin: Secondary | ICD-10-CM

## 2015-06-30 DIAGNOSIS — I2510 Atherosclerotic heart disease of native coronary artery without angina pectoris: Secondary | ICD-10-CM | POA: Diagnosis present

## 2015-06-30 DIAGNOSIS — I10 Essential (primary) hypertension: Secondary | ICD-10-CM | POA: Diagnosis present

## 2015-06-30 DIAGNOSIS — R0602 Shortness of breath: Secondary | ICD-10-CM | POA: Diagnosis not present

## 2015-06-30 DIAGNOSIS — Z6835 Body mass index (BMI) 35.0-35.9, adult: Secondary | ICD-10-CM | POA: Diagnosis not present

## 2015-06-30 DIAGNOSIS — F314 Bipolar disorder, current episode depressed, severe, without psychotic features: Secondary | ICD-10-CM | POA: Diagnosis present

## 2015-06-30 DIAGNOSIS — I5033 Acute on chronic diastolic (congestive) heart failure: Secondary | ICD-10-CM | POA: Diagnosis present

## 2015-06-30 DIAGNOSIS — Z885 Allergy status to narcotic agent status: Secondary | ICD-10-CM

## 2015-06-30 DIAGNOSIS — Z794 Long term (current) use of insulin: Secondary | ICD-10-CM | POA: Diagnosis not present

## 2015-06-30 DIAGNOSIS — R0603 Acute respiratory distress: Secondary | ICD-10-CM | POA: Diagnosis present

## 2015-06-30 DIAGNOSIS — I252 Old myocardial infarction: Secondary | ICD-10-CM

## 2015-06-30 DIAGNOSIS — M545 Low back pain, unspecified: Secondary | ICD-10-CM

## 2015-06-30 DIAGNOSIS — R000 Tachycardia, unspecified: Secondary | ICD-10-CM | POA: Diagnosis present

## 2015-06-30 DIAGNOSIS — J80 Acute respiratory distress syndrome: Secondary | ICD-10-CM

## 2015-06-30 DIAGNOSIS — Z95828 Presence of other vascular implants and grafts: Secondary | ICD-10-CM

## 2015-06-30 DIAGNOSIS — K5520 Angiodysplasia of colon without hemorrhage: Secondary | ICD-10-CM

## 2015-06-30 DIAGNOSIS — E119 Type 2 diabetes mellitus without complications: Secondary | ICD-10-CM | POA: Diagnosis present

## 2015-06-30 DIAGNOSIS — Z951 Presence of aortocoronary bypass graft: Secondary | ICD-10-CM

## 2015-06-30 DIAGNOSIS — G8929 Other chronic pain: Secondary | ICD-10-CM | POA: Diagnosis present

## 2015-06-30 DIAGNOSIS — Q2733 Arteriovenous malformation of digestive system vessel: Secondary | ICD-10-CM

## 2015-06-30 DIAGNOSIS — F332 Major depressive disorder, recurrent severe without psychotic features: Secondary | ICD-10-CM | POA: Diagnosis present

## 2015-06-30 DIAGNOSIS — I714 Abdominal aortic aneurysm, without rupture, unspecified: Secondary | ICD-10-CM | POA: Diagnosis present

## 2015-06-30 DIAGNOSIS — R609 Edema, unspecified: Secondary | ICD-10-CM | POA: Diagnosis not present

## 2015-06-30 DIAGNOSIS — F3162 Bipolar disorder, current episode mixed, moderate: Secondary | ICD-10-CM

## 2015-06-30 DIAGNOSIS — Z9981 Dependence on supplemental oxygen: Secondary | ICD-10-CM

## 2015-06-30 DIAGNOSIS — E785 Hyperlipidemia, unspecified: Secondary | ICD-10-CM | POA: Diagnosis present

## 2015-06-30 DIAGNOSIS — J441 Chronic obstructive pulmonary disease with (acute) exacerbation: Secondary | ICD-10-CM | POA: Diagnosis not present

## 2015-06-30 HISTORY — DX: Morbid (severe) obesity due to excess calories: E66.01

## 2015-06-30 HISTORY — DX: Hyperlipidemia, unspecified: E78.5

## 2015-06-30 HISTORY — DX: Chronic diastolic (congestive) heart failure: I50.32

## 2015-06-30 HISTORY — DX: Type 2 diabetes mellitus without complications: E11.9

## 2015-06-30 LAB — COMPREHENSIVE METABOLIC PANEL
ALT: 9 U/L — AB (ref 17–63)
AST: 36 U/L (ref 15–41)
Albumin: 3 g/dL — ABNORMAL LOW (ref 3.5–5.0)
Alkaline Phosphatase: 88 U/L (ref 38–126)
Anion gap: 10 (ref 5–15)
BILIRUBIN TOTAL: 0.9 mg/dL (ref 0.3–1.2)
BUN: 11 mg/dL (ref 6–20)
CHLORIDE: 88 mmol/L — AB (ref 101–111)
CO2: 39 mmol/L — ABNORMAL HIGH (ref 22–32)
CREATININE: 1.28 mg/dL — AB (ref 0.61–1.24)
Calcium: 8.6 mg/dL — ABNORMAL LOW (ref 8.9–10.3)
GFR, EST NON AFRICAN AMERICAN: 54 mL/min — AB (ref >60)
Glucose, Bld: 302 mg/dL — ABNORMAL HIGH (ref 65–99)
Potassium: 4.8 mmol/L (ref 3.5–5.1)
Sodium: 137 mmol/L (ref 135–145)
TOTAL PROTEIN: 6 g/dL — AB (ref 6.5–8.1)

## 2015-06-30 LAB — CBC
HCT: 34.3 % — ABNORMAL LOW (ref 40.0–52.0)
Hemoglobin: 11 g/dL — ABNORMAL LOW (ref 13.0–18.0)
MCH: 29.1 pg (ref 26.0–34.0)
MCHC: 32 g/dL (ref 32.0–36.0)
MCV: 90.9 fL (ref 80.0–100.0)
PLATELETS: 162 10*3/uL (ref 150–440)
RBC: 3.77 MIL/uL — AB (ref 4.40–5.90)
RDW: 20.4 % — ABNORMAL HIGH (ref 11.5–14.5)
WBC: 5.8 10*3/uL (ref 3.8–10.6)

## 2015-06-30 LAB — URINALYSIS COMPLETE WITH MICROSCOPIC (ARMC ONLY)
BACTERIA UA: NONE SEEN
BILIRUBIN URINE: NEGATIVE
GLUCOSE, UA: NEGATIVE mg/dL
Hgb urine dipstick: NEGATIVE
Ketones, ur: NEGATIVE mg/dL
Leukocytes, UA: NEGATIVE
Nitrite: NEGATIVE
Protein, ur: NEGATIVE mg/dL
RBC / HPF: NONE SEEN RBC/hpf (ref 0–5)
Specific Gravity, Urine: 1.008 (ref 1.005–1.030)
pH: 9 — ABNORMAL HIGH (ref 5.0–8.0)

## 2015-06-30 LAB — GLUCOSE, CAPILLARY
GLUCOSE-CAPILLARY: 208 mg/dL — AB (ref 65–99)
GLUCOSE-CAPILLARY: 206 mg/dL — AB (ref 65–99)
Glucose-Capillary: 282 mg/dL — ABNORMAL HIGH (ref 65–99)

## 2015-06-30 LAB — TROPONIN I
TROPONIN I: 0.03 ng/mL (ref <0.031)
Troponin I: 0.03 ng/mL (ref <0.031)

## 2015-06-30 LAB — HEMOGLOBIN A1C: HEMOGLOBIN A1C: 7.2 % — AB (ref 4.0–6.0)

## 2015-06-30 LAB — TSH: TSH: 4.126 u[IU]/mL (ref 0.350–4.500)

## 2015-06-30 LAB — PROTIME-INR
INR: 1.03
PROTHROMBIN TIME: 13.7 s (ref 11.4–15.0)

## 2015-06-30 LAB — APTT: APTT: 29 s (ref 24–36)

## 2015-06-30 MED ORDER — FUROSEMIDE 10 MG/ML IJ SOLN: 40.0000 mg | Freq: Two times a day (BID) | INTRAMUSCULAR | Status: DC

## 2015-06-30 MED ORDER — ENOXAPARIN SODIUM 40 MG/0.4ML ~~LOC~~ SOLN
40.0000 mg | SUBCUTANEOUS | Status: DC
  Administered 2015-07-01 – 2015-07-02 (×2): 40 mg via SUBCUTANEOUS
  Filled 2015-06-30 (×2): qty 0.4

## 2015-06-30 MED ORDER — INSULIN ASPART 100 UNIT/ML ~~LOC~~ SOLN
0.0000 [IU] | Freq: Every day | SUBCUTANEOUS | Status: DC
  Administered 2015-06-30: 2 [IU] via SUBCUTANEOUS
  Administered 2015-07-01: 3 [IU] via SUBCUTANEOUS
  Filled 2015-06-30: qty 3
  Filled 2015-06-30: qty 2

## 2015-06-30 MED ORDER — HYDROCODONE-ACETAMINOPHEN 5-325 MG PO TABS
1.0000 | ORAL_TABLET | Freq: Four times a day (QID) | ORAL | Status: DC | PRN
  Administered 2015-06-30 – 2015-07-03 (×6): 1 via ORAL
  Filled 2015-06-30 (×6): qty 1

## 2015-06-30 MED ORDER — ZOLPIDEM TARTRATE 5 MG PO TABS
5.0000 mg | ORAL_TABLET | Freq: Every evening | ORAL | Status: DC | PRN
  Administered 2015-06-30 – 2015-07-02 (×3): 5 mg via ORAL
  Filled 2015-06-30 (×3): qty 1

## 2015-06-30 MED ORDER — ONDANSETRON HCL 4 MG/2ML IJ SOLN: 4.0000 mg | Freq: Four times a day (QID) | INTRAMUSCULAR | Status: DC | PRN

## 2015-06-30 MED ORDER — ATORVASTATIN CALCIUM 20 MG PO TABS
40.0000 mg | ORAL_TABLET | Freq: Every day | ORAL | Status: DC
  Administered 2015-06-30 – 2015-07-02 (×3): 40 mg via ORAL
  Filled 2015-06-30 (×3): qty 2

## 2015-06-30 MED ORDER — BISACODYL 10 MG RE SUPP: 10.0000 mg | Freq: Every day | RECTAL | Status: DC | PRN

## 2015-06-30 MED ORDER — TIOTROPIUM BROMIDE MONOHYDRATE 18 MCG IN CAPS
18.0000 ug | ORAL_CAPSULE | Freq: Every day | RESPIRATORY_TRACT | Status: DC
  Administered 2015-06-30 – 2015-07-03 (×4): 18 ug via RESPIRATORY_TRACT
  Filled 2015-06-30: qty 5

## 2015-06-30 MED ORDER — INSULIN GLARGINE 100 UNIT/ML ~~LOC~~ SOLN
17.0000 [IU] | Freq: Two times a day (BID) | SUBCUTANEOUS | Status: DC
  Administered 2015-06-30 – 2015-07-01 (×3): 17 [IU] via SUBCUTANEOUS
  Filled 2015-06-30 (×6): qty 0.17

## 2015-06-30 MED ORDER — IPRATROPIUM-ALBUTEROL 0.5-2.5 (3) MG/3ML IN SOLN
3.0000 mL | Freq: Once | RESPIRATORY_TRACT | Status: AC
  Administered 2015-06-30: 3 mL via RESPIRATORY_TRACT
  Filled 2015-06-30: qty 3

## 2015-06-30 MED ORDER — ALPRAZOLAM 0.5 MG PO TABS
0.5000 mg | ORAL_TABLET | Freq: Three times a day (TID) | ORAL | Status: DC | PRN
  Administered 2015-06-30 – 2015-07-03 (×6): 0.5 mg via ORAL
  Filled 2015-06-30 (×6): qty 1

## 2015-06-30 MED ORDER — FERROUS SULFATE 325 (65 FE) MG PO TABS
650.0000 mg | ORAL_TABLET | Freq: Three times a day (TID) | ORAL | Status: DC
  Administered 2015-06-30 – 2015-07-03 (×8): 650 mg via ORAL
  Filled 2015-06-30 (×8): qty 2

## 2015-06-30 MED ORDER — VITAMIN D (ERGOCALCIFEROL) 1.25 MG (50000 UNIT) PO CAPS
50000.0000 [IU] | ORAL_CAPSULE | ORAL | Status: DC
  Administered 2015-06-30: 50000 [IU] via ORAL
  Filled 2015-06-30 (×2): qty 1

## 2015-06-30 MED ORDER — ALBUTEROL SULFATE (2.5 MG/3ML) 0.083% IN NEBU: 2.5000 mg | INHALATION_SOLUTION | Freq: Four times a day (QID) | RESPIRATORY_TRACT | Status: DC | PRN

## 2015-06-30 MED ORDER — DIVALPROEX SODIUM 500 MG PO DR TAB
500.0000 mg | DELAYED_RELEASE_TABLET | Freq: Every day | ORAL | Status: DC
  Administered 2015-06-30 – 2015-07-02 (×3): 500 mg via ORAL
  Filled 2015-06-30 (×5): qty 1

## 2015-06-30 MED ORDER — FUROSEMIDE 10 MG/ML IJ SOLN
80.0000 mg | Freq: Two times a day (BID) | INTRAMUSCULAR | Status: DC
  Administered 2015-06-30 – 2015-07-03 (×6): 80 mg via INTRAVENOUS
  Filled 2015-06-30 (×6): qty 8

## 2015-06-30 MED ORDER — CITALOPRAM HYDROBROMIDE 20 MG PO TABS
40.0000 mg | ORAL_TABLET | Freq: Every day | ORAL | Status: DC
  Administered 2015-06-30 – 2015-07-03 (×4): 40 mg via ORAL
  Filled 2015-06-30 (×4): qty 2

## 2015-06-30 MED ORDER — METOPROLOL TARTRATE 25 MG PO TABS
25.0000 mg | ORAL_TABLET | Freq: Two times a day (BID) | ORAL | Status: DC
  Administered 2015-06-30 – 2015-07-03 (×7): 25 mg via ORAL
  Filled 2015-06-30 (×7): qty 1

## 2015-06-30 MED ORDER — SUCRALFATE 1 G PO TABS
1.0000 g | ORAL_TABLET | Freq: Three times a day (TID) | ORAL | Status: DC
  Administered 2015-06-30 – 2015-07-03 (×11): 1 g via ORAL
  Filled 2015-06-30 (×11): qty 1

## 2015-06-30 MED ORDER — BUDESONIDE-FORMOTEROL FUMARATE 160-4.5 MCG/ACT IN AERO
2.0000 | INHALATION_SPRAY | Freq: Two times a day (BID) | RESPIRATORY_TRACT | Status: DC
  Administered 2015-06-30 – 2015-07-03 (×7): 2 via RESPIRATORY_TRACT
  Filled 2015-06-30: qty 6

## 2015-06-30 MED ORDER — FUROSEMIDE 10 MG/ML IJ SOLN
40.0000 mg | Freq: Once | INTRAMUSCULAR | Status: AC
  Administered 2015-06-30: 40 mg via INTRAVENOUS
  Filled 2015-06-30: qty 4

## 2015-06-30 MED ORDER — PANTOPRAZOLE SODIUM 40 MG PO TBEC
40.0000 mg | DELAYED_RELEASE_TABLET | Freq: Two times a day (BID) | ORAL | Status: DC
  Administered 2015-06-30 – 2015-07-03 (×7): 40 mg via ORAL
  Filled 2015-06-30 (×7): qty 1

## 2015-06-30 MED ORDER — INSULIN ASPART 100 UNIT/ML ~~LOC~~ SOLN
0.0000 [IU] | Freq: Three times a day (TID) | SUBCUTANEOUS | Status: DC
  Administered 2015-06-30: 11 [IU] via SUBCUTANEOUS
  Administered 2015-07-01: 7 [IU] via SUBCUTANEOUS
  Administered 2015-07-01: 11 [IU] via SUBCUTANEOUS
  Administered 2015-07-01 – 2015-07-02 (×4): 7 [IU] via SUBCUTANEOUS
  Administered 2015-07-03: 4 [IU] via SUBCUTANEOUS
  Filled 2015-06-30 (×3): qty 7
  Filled 2015-06-30: qty 4
  Filled 2015-06-30: qty 7
  Filled 2015-06-30 (×2): qty 11
  Filled 2015-06-30: qty 7

## 2015-06-30 MED ORDER — ALUM & MAG HYDROXIDE-SIMETH 200-200-20 MG/5ML PO SUSP
30.0000 mL | Freq: Four times a day (QID) | ORAL | Status: DC | PRN
  Filled 2015-06-30: qty 30

## 2015-06-30 MED ORDER — ONDANSETRON HCL 4 MG PO TABS: 4.0000 mg | ORAL_TABLET | Freq: Four times a day (QID) | ORAL | Status: DC | PRN

## 2015-06-30 MED ORDER — IOHEXOL 350 MG/ML SOLN
100.0000 mL | Freq: Once | INTRAVENOUS | Status: AC | PRN
  Administered 2015-07-01: 100 mL via INTRAVENOUS

## 2015-06-30 MED ORDER — ACETAMINOPHEN 325 MG PO TABS: 650.0000 mg | ORAL_TABLET | Freq: Four times a day (QID) | ORAL | Status: DC | PRN

## 2015-06-30 MED ORDER — SODIUM CHLORIDE 0.9 % IJ SOLN
3.0000 mL | Freq: Two times a day (BID) | INTRAMUSCULAR | Status: DC
  Administered 2015-06-30 – 2015-07-02 (×6): 3 mL via INTRAVENOUS

## 2015-06-30 MED ORDER — ALBUTEROL SULFATE HFA 108 (90 BASE) MCG/ACT IN AERS: 2.0000 | INHALATION_SPRAY | Freq: Four times a day (QID) | RESPIRATORY_TRACT | Status: DC | PRN

## 2015-06-30 MED ORDER — INSULIN ASPART 100 UNIT/ML ~~LOC~~ SOLN
10.0000 [IU] | Freq: Three times a day (TID) | SUBCUTANEOUS | Status: DC
  Administered 2015-06-30 – 2015-07-03 (×8): 10 [IU] via SUBCUTANEOUS
  Filled 2015-06-30 (×8): qty 10

## 2015-06-30 MED ORDER — DOCUSATE SODIUM 100 MG PO CAPS
100.0000 mg | ORAL_CAPSULE | Freq: Two times a day (BID) | ORAL | Status: DC
  Administered 2015-06-30 – 2015-07-03 (×7): 100 mg via ORAL
  Filled 2015-06-30 (×7): qty 1

## 2015-06-30 NOTE — Consult Note (Signed)
[facility] Consultation     Date: 06/30/2015,   MRN# [id number] [name] August 29, 1941 Code Status:     Code Status Orders        Start     Ordered   06/30/15 1240  Full code   Continuous     06/30/15 1240     Hosp day:@LENGTHOFSTAYDAYS @ Referring MD: @ATDPROV @     PCP:      AdmissionWeight: 272 lb (123.378 kg)                 CurrentWeight: 272 lb 14.4 oz (123.787 kg) [name] is a 74 y.o. old male seen in consultation for lower ext swelling  CC/HPI  CC: Lower ext swelling  HPI: 74 yo white male with multiple medical issues admitted for lower ext swelling r/o PE from PMD office.  Patient has chronic SOB on chronic oxygen therapy seen br Dr. Sherrin Daisy in the past.  Previous CT scan reveal bullous emphysema Patient does not smoke anymore, he does not have any acute resp issues at this time Patient resting and breathing comfortably, patient has no signs or symtpoms of infection.   CC: lower ext swelling cc        MEDICATIONS    Home Medication:  No current outpatient prescriptions on file.  Current Medication:   Current facility-administered medications:  .  acetaminophen (TYLENOL) tablet 650 mg, 650 mg, Oral, Q6H PRN, Sital Mody, MD .  albuterol (PROVENTIL) (2.5 MG/3ML) 0.083% nebulizer solution 2.5 mg, 2.5 mg, Nebulization, Q6H PRN, Bettey Costa, MD .  ALPRAZolam Duanne Moron) tablet 0.5 mg, 0.5 mg, Oral, TID PRN, Bettey Costa, MD .  alum & mag hydroxide-simeth (MAALOX/MYLANTA) 200-200-20 MG/5ML suspension 30 mL, 30 mL, Oral, Q6H PRN, Bettey Costa, MD .  atorvastatin (LIPITOR) tablet 40 mg, 40 mg, Oral, QHS, Sital Mody, MD .  bisacodyl (DULCOLAX) suppository 10 mg, 10 mg, Rectal, Daily PRN, Bettey Costa, MD .  budesonide-formoterol (SYMBICORT) 160-4.5 MCG/ACT inhaler 2 puff, 2 puff, Inhalation, BID, Bettey Costa, MD, 2 puff at 06/30/15 1359 .  citalopram (CELEXA) tablet 40 mg, 40 mg, Oral, Daily, Bettey Costa, MD, 40 mg at 06/30/15 1358 .   divalproex (DEPAKOTE) DR tablet 500 mg, 500 mg, Oral, QHS, Sital Mody, MD .  docusate sodium (COLACE) capsule 100 mg, 100 mg, Oral, BID, Bettey Costa, MD, 100 mg at 06/30/15 1358 .  enoxaparin (LOVENOX) injection 40 mg, 40 mg, Subcutaneous, Q24H, Sital Mody, MD, 40 mg at 06/30/15 1245 .  ferrous sulfate tablet 650 mg, 650 mg, Oral, TID WC, Sital Mody, MD .  furosemide (LASIX) injection 40 mg, 40 mg, Intravenous, BID, Sital Mody, MD .  HYDROcodone-acetaminophen (NORCO/VICODIN) 5-325 MG per tablet 1 tablet, 1 tablet, Oral, Q6H PRN, Bettey Costa, MD, 1 tablet at 06/30/15 1358 .  insulin aspart (novoLOG) injection 0-20 Units, 0-20 Units, Subcutaneous, TID WC, Sital Mody, MD .  insulin aspart (novoLOG) injection 0-5 Units, 0-5 Units, Subcutaneous, QHS, Sital Mody, MD .  insulin aspart (novoLOG) injection 10 Units, 10 Units, Subcutaneous, TID WC, Sital Mody, MD .  insulin glargine (LANTUS) injection 17 Units, 17 Units, Subcutaneous, BID, Bettey Costa, MD, 17 Units at 06/30/15 1425 .  metoprolol tartrate (LOPRESSOR) tablet 25 mg, 25 mg, Oral, BID, Bettey Costa, MD, 25 mg at 06/30/15 1358 .  ondansetron (ZOFRAN) tablet 4 mg, 4 mg, Oral, Q6H PRN **OR** ondansetron (ZOFRAN) injection 4 mg, 4 mg, Intravenous, Q6H PRN, Bettey Costa, MD .  pantoprazole (PROTONIX) EC tablet 40  mg, 40 mg, Oral, BID, Bettey Costa, MD, 40 mg at 06/30/15 1358 .  sodium chloride 0.9 % injection 3 mL, 3 mL, Intravenous, Q12H, Sital Mody, MD .  sucralfate (CARAFATE) tablet 1 g, 1 g, Oral, TID WC & HS, Sital Mody, MD .  tiotropium (SPIRIVA) inhalation capsule 18 mcg, 18 mcg, Inhalation, Daily, Bettey Costa, MD, 18 mcg at 06/30/15 1359 .  Vitamin D (Ergocalciferol) (DRISDOL) capsule 50,000 Units, 50,000 Units, Oral, Q7 days, Bettey Costa, MD     ALLERGIES   Penicillins; Demerol; and Dilaudid     REVIEW OF SYSTEMS   Review of Systems  Constitutional: Negative for fever, chills, weight loss, malaise/fatigue and diaphoresis.  HENT: Negative  for congestion and hearing loss.   Eyes: Negative for blurred vision and double vision.  Respiratory: Positive for shortness of breath. Negative for cough and wheezing.   Cardiovascular: Negative for chest pain, palpitations and orthopnea.  Gastrointestinal: Negative for heartburn, nausea, vomiting, abdominal pain, diarrhea, constipation and blood in stool.  Genitourinary: Negative for dysuria and urgency.  Musculoskeletal: Negative for myalgias, back pain and neck pain.  Skin: Negative for rash.  Neurological: Negative for dizziness, weakness and headaches.  Endo/Heme/Allergies: Does not bruise/bleed easily.  Psychiatric/Behavioral: The patient is not nervous/anxious.   All other systems reviewed and are negative.    VS: BP 120/62 mmHg  Pulse 110  Temp(Src) 98 F (36.7 C) (Oral)  Resp 20  Ht 6\' 2"  (1.88 m)  Wt 272 lb 14.4 oz (123.787 kg)  BMI 35.02 kg/m2  SpO2 95%     PHYSICAL EXAM   Physical Exam  Constitutional: He is oriented to person, place, and time. He appears well-developed and well-nourished. No distress.  HENT:  Head: Normocephalic and atraumatic.  Mouth/Throat: No oropharyngeal exudate.  Eyes: EOM are normal. Pupils are equal, round, and reactive to light. No scleral icterus.  Neck: Normal range of motion. Neck supple.  Cardiovascular: Normal rate, regular rhythm and normal heart sounds.   No murmur heard. Pulmonary/Chest: No stridor. No respiratory distress. He has no wheezes. He has rales.  Abdominal: Soft. Bowel sounds are normal. He exhibits no distension. There is no tenderness. There is no rebound.  Musculoskeletal: Normal range of motion. He exhibits edema.  Neurological: He is alert and oriented to person, place, and time. He displays normal reflexes. Coordination normal.  Skin: Skin is warm. He is not diaphoretic.  Psychiatric: He has a normal mood and affect.        LABS    Recent Labs     06/30/15  0629  HGB  11.0*  HCT  34.3*  MCV  90.9   WBC  5.8  BUN  11  CREATININE  1.28*  GLUCOSE  302*  CALCIUM  8.6*  INR  1.03  ,    No results for input(s): PH in the last 72 hours.  Invalid input(s): PCO2, PO2, BASEEXCESS, BASEDEFICITE, TFT    CULTURE RESULTS   No results found for this or any previous visit (from the past 240 hour(s)).        IMAGING    Dg Chest Portable 1 View  06/30/2015   CLINICAL DATA:  Weakness and cough for 10 days  EXAM: PORTABLE CHEST - 1 VIEW  COMPARISON:  May 23, 2015  FINDINGS: There is persistent consolidation in the left lower lobe region. There is chronic central peribronchial thickening. No new opacity seen. Heart is upper normal in size with pulmonary vascularity within normal limits. No adenopathy.  IMPRESSION: Persistent left lower lobe consolidation. The chronicity of this finding may warrant correlation with chest CT or bronchoscopy to further assess the left lower lobe region. No new opacity. Evidence a degree of underlying chronic bronchitis.   Electronically Signed   By: Lowella Grip III M.D.   On: 06/30/2015 08:20        ASSESSMENT/PLAN   74 yo obese white male with findings c/w cor pulmonale RTsided diastolic heart failure in the setting of severe bullous emphysema PE less likley and even if patient were to have PE,  patient is at high risk for bleeding.   The Left lower lobe opacity likely related to effusion and atalectasis  Recommend [place] lower ext assess for DVT, if + recommend IVC filter  1.oxygen as needed 2.continue COPD therapy 3.US lower dopplers 4.lasix as tolerated 5.encourage ambulation    I have personally obtained a history, examined the patient, evaluated laboratory and independently reviewed imaging results, formulated the assessment and plan and placed orders.  The Patient requires high complexity decision making for assessment and support, frequent evaluation and titration of therapies, application of advanced monitoring technologies and  extensive interpretation of multiple databases. Time Spent with Patient 45 mins  Patient is satisfied with Plan of action and management.   [name], M.D.  [facility]  Medical Director [place] Director [facility] Cardio-Pulmonary Department

## 2015-06-30 NOTE — Care Management (Signed)
Was being followed by [facility].  Agency found issues with med administration.  Wife was getting meds- from all over the house and giving them to patient.  She may have given him more Torsemide than ordered.  [place] is now setting medication schedule for patient that will prevent errors.

## 2015-06-30 NOTE — ED Notes (Signed)
Pt alert and oriented, watching t.v and resting. ABCs intact.

## 2015-06-30 NOTE — ED Notes (Signed)
Patient transported to CT 
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
## 2015-06-30 NOTE — ED Notes (Addendum)
Pt alert and oriented. ABCs intact. No acute distress noted. MD at bedside.
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
## 2015-06-30 NOTE — Progress Notes (Signed)
Pt requesting his home Ambien dose, it did not get put in when he was admitted, MD paged, Dr. Lavetta Nielsen to put in orders for home dose Ambien 5mg . No other concerns/orders. Will continue to monitor. Conley Simmonds, RN

## 2015-06-30 NOTE — ED Notes (Signed)
CT angio called, attempted to obtain 20G AC PIV, unable.  Asked [place] to attempt with [place].

## 2015-06-30 NOTE — ED Notes (Signed)
Pt to triage via w/c, O2 in place; reports generalized swelling x 4 days despite fluid med, SOB, lower leg pain, prod cough clear sputum

## 2015-06-30 NOTE — Progress Notes (Signed)
PT Hold Note  Patient Details Name: GINO GARRABRANT MRN: [id number] DOB: 09/10/1941   Cancelled Treatment:    Reason Eval/Treat Not Completed: Medical issues which prohibited therapy. Order received and chart reviewed. CT angio pending to rule out acute PE. Pt with tachycardia and SOB upon admission. Will hold evaluation until medically cleared.   Lyndel Safe Huprich PT, DPT   Huprich,Jason 06/30/2015, 2:38 PM

## 2015-06-30 NOTE — ED Notes (Signed)
[facility] on 2a, after speaking with Dr Lisbeth Renshaw regarding IV access.  Dr Lisbeth Renshaw placed order for a PICC line, and approved for pt to go to 2a.

## 2015-07-01 ENCOUNTER — Inpatient Hospital Stay: Payer: Commercial Managed Care - HMO

## 2015-07-01 ENCOUNTER — Encounter: Payer: Self-pay | Admitting: Radiology

## 2015-07-01 DIAGNOSIS — R609 Edema, unspecified: Secondary | ICD-10-CM | POA: Insufficient documentation

## 2015-07-01 DIAGNOSIS — R0602 Shortness of breath: Secondary | ICD-10-CM | POA: Insufficient documentation

## 2015-07-01 DIAGNOSIS — E785 Hyperlipidemia, unspecified: Secondary | ICD-10-CM

## 2015-07-01 LAB — COMPREHENSIVE METABOLIC PANEL
ALBUMIN: 2.9 g/dL — AB (ref 3.5–5.0)
ALT: 8 U/L — ABNORMAL LOW (ref 17–63)
ANION GAP: 8 (ref 5–15)
AST: 18 U/L (ref 15–41)
Alkaline Phosphatase: 80 U/L (ref 38–126)
BUN: 12 mg/dL (ref 6–20)
CO2: 39 mmol/L — AB (ref 22–32)
Calcium: 8.7 mg/dL — ABNORMAL LOW (ref 8.9–10.3)
Chloride: 90 mmol/L — ABNORMAL LOW (ref 101–111)
Creatinine, Ser: 1.15 mg/dL (ref 0.61–1.24)
GFR calc Af Amer: >60 mL/min (ref >60)
GFR calc non Af Amer: >60 mL/min (ref >60)
GLUCOSE: 253 mg/dL — AB (ref 65–99)
POTASSIUM: 3.8 mmol/L (ref 3.5–5.1)
SODIUM: 137 mmol/L (ref 135–145)
Total Bilirubin: 0.2 mg/dL — ABNORMAL LOW (ref 0.3–1.2)
Total Protein: 5.8 g/dL — ABNORMAL LOW (ref 6.5–8.1)

## 2015-07-01 LAB — CBC
HEMATOCRIT: 34.3 % — AB (ref 40.0–52.0)
HEMOGLOBIN: 11 g/dL — AB (ref 13.0–18.0)
MCH: 29.1 pg (ref 26.0–34.0)
MCHC: 32.2 g/dL (ref 32.0–36.0)
MCV: 90.6 fL (ref 80.0–100.0)
Platelets: 155 10*3/uL (ref 150–440)
RBC: 3.79 MIL/uL — ABNORMAL LOW (ref 4.40–5.90)
RDW: 20.1 % — ABNORMAL HIGH (ref 11.5–14.5)
WBC: 5.5 10*3/uL (ref 3.8–10.6)

## 2015-07-01 LAB — PROTIME-INR
INR: 1.04
Prothrombin Time: 13.8 seconds (ref 11.4–15.0)

## 2015-07-01 LAB — GLUCOSE, CAPILLARY
Glucose-Capillary: 232 mg/dL — ABNORMAL HIGH (ref 65–99)
Glucose-Capillary: 212 mg/dL — ABNORMAL HIGH (ref 65–99)
Glucose-Capillary: 261 mg/dL — ABNORMAL HIGH (ref 65–99)
Glucose-Capillary: 257 mg/dL — ABNORMAL HIGH (ref 65–99)

## 2015-07-01 LAB — TROPONIN I: Troponin I: <0.03 ng/mL (ref <0.031)

## 2015-07-01 MED ORDER — SODIUM CHLORIDE 0.9 % IJ SOLN
10.0000 mL | INTRAMUSCULAR | Status: DC | PRN
  Administered 2015-07-02: 10 mL
  Filled 2015-07-01: qty 40

## 2015-07-01 MED ORDER — INSULIN GLARGINE 100 UNIT/ML ~~LOC~~ SOLN
19.0000 [IU] | Freq: Two times a day (BID) | SUBCUTANEOUS | Status: DC
  Administered 2015-07-01 – 2015-07-02 (×2): 19 [IU] via SUBCUTANEOUS
  Filled 2015-07-01 (×4): qty 0.19

## 2015-07-01 MED ORDER — IOHEXOL 350 MG/ML SOLN: 100.0000 mL | Freq: Once | INTRAVENOUS | Status: DC | PRN

## 2015-07-01 MED ORDER — SODIUM CHLORIDE 0.9 % IJ SOLN
10.0000 mL | Freq: Two times a day (BID) | INTRAMUSCULAR | Status: DC
  Administered 2015-07-01 – 2015-07-02 (×4): 10 mL
  Administered 2015-07-02: 13 mL
  Administered 2015-07-03: 10 mL

## 2015-07-01 NOTE — Care Management (Signed)
Spoke with Amy White from [facility].  Informed her of [facility] reported that there is most likely medication   administration issues present and patient's pharmacist is now going to package meds that will encourage compliance.  Not sure of the specifics of this intervention.  [facility] is following patient.

## 2015-07-01 NOTE — Progress Notes (Signed)
[facility] [place] NAME: [name]    MR#:  [id number]  DATE OF BIRTH:  Jan 07, 1941  SUBJECTIVE:  CHIEF COMPLAINT:   Chief Complaint  Patient presents with  . Leg Swelling  . Shortness of Breath   In and shortness of breath is better but has pain all over the body.  REVIEW OF SYSTEMS:  CONSTITUTIONAL: No fever, generalized weakness.  EYES: No blurred or double vision.  EARS, NOSE, AND THROAT: No tinnitus or ear pain.  RESPIRATORY: has cough, shortness of breath, no wheezing or hemoptysis.  CARDIOVASCULAR: No chest pain, orthopnea, edema.  GASTROINTESTINAL: No nausea, vomiting, diarrhea or abdominal pain.  GENITOURINARY: No dysuria, hematuria.  ENDOCRINE: No polyuria, nocturia,  HEMATOLOGY: No anemia, easy bruising or bleeding SKIN: No rash or lesion. MUSCULOSKELETAL: No joint pain or arthritis.   body pain.  NEUROLOGIC: No tingling, numbness, weakness.  PSYCHIATRY: No anxiety or depression.   DRUG ALLERGIES:   Allergies  Allergen Reactions  . Penicillins Anaphylaxis and Hives  . Demerol [Meperidine] Other (See Comments)    Reaction:  Hallucinations    . Dilaudid [Hydromorphone Hcl] Other (See Comments)    Reaction:  Hallucinations     VITALS:  Blood pressure 121/73, pulse 39, temperature 97.5 F (36.4 C), temperature source Oral, resp. rate 19, height 6\' 2"  (1.88 m), weight 123.787 kg (272 lb 14.4 oz), SpO2 95 %.  PHYSICAL EXAMINATION:  GENERAL:  74 y.o.-year-old patient lying in the bed with no acute distress.  obese.  EYES: Pupils equal, round, reactive to light and accommodation. No scleral icterus. Extraocular muscles intact.  HEENT: Head atraumatic, normocephalic. Oropharynx and nasopharynx clear.  NECK:  Supple, no jugular venous distention. No thyroid enlargement, no tenderness.  LUNGS: decreased  breath sounds bilaterally, no wheezing, rales,rhonchi or crepitation. No use of accessory muscles of  respiration.  CARDIOVASCULAR: S1, S2 normal. No murmurs, rubs, or gallops.  ABDOMEN: Soft, nontender, nondistended. Bowel sounds present. No organomegaly or mass.  EXTREMITIES:bilateral lower extremity edema 1+ ,  no cyanosis, or clubbing.  NEUROLOGIC: Cranial nerves II through XII are intact. Muscle strength 5/5 in all extremities. Sensation intact. Gait not checked.  PSYCHIATRIC: The patient is alert and oriented x 3.  SKIN: No obvious rash, lesion, or ulcer.    LABORATORY PANEL:   CBC  Recent Labs Lab 07/01/15 0509  WBC 5.5  HGB 11.0*  HCT 34.3*  PLT 155   ------------------------------------------------------------------------------------------------------------------  Chemistries   Recent Labs Lab 07/01/15 0509  NA 137  K 3.8  CL 90*  CO2 39*  GLUCOSE 253*  BUN 12  CREATININE 1.15  CALCIUM 8.7*  AST 18  ALT 8*  ALKPHOS 80  BILITOT 0.2*   ------------------------------------------------------------------------------------------------------------------  Cardiac Enzymes  Recent Labs Lab 07/01/15 0145  TROPONINI <0.03   ------------------------------------------------------------------------------------------------------------------  RADIOLOGY:  Dg Chest 1 View  06/30/2015   CLINICAL DATA:  Bedside PICC placement.  EXAM: Portable CHEST  1 VIEW 1738 hr:  COMPARISON:  Portable chest x-ray earlier same date 0719 hr and earlier.  FINDINGS: Right arm PICC tip projects over the lower SVC. Prior sternotomy. Cardiac silhouette mildly to moderately enlarged. Emphysematous changes in the upper lobes with a large bleb in the left apex. Dense left lower lobe consolidation, unchanged. No new pulmonary parenchymal abnormalities.  IMPRESSION: 1. Right arm PICC tip projects over the lower SVC. 2. Stable dense left lower lobe atelectasis and/or pneumonia. No new abnormalities since earlier today.   Electronically  Signed   By: Evangeline Dakin M.D.   On: 06/30/2015 18:02   Ct  Angio Chest Pe W/cm &/or Wo Cm  07/01/2015   CLINICAL DATA:  COPD. Increasing lower extremity edema and shortness of breath for past 4 days. Sinus tachycardia.  EXAM: CT ANGIOGRAPHY CHEST WITH CONTRAST  TECHNIQUE: Multidetector CT imaging of the chest was performed using the standard protocol during bolus administration of intravenous contrast. Multiplanar CT image reconstructions and MIPs were obtained to evaluate the vascular anatomy.  CONTRAST:  138mL OMNIPAQUE IOHEXOL 350 MG/ML SOLN  COMPARISON:  Chest CT 05/03/2015  FINDINGS: Bullous emphysematous changes noted in the lungs, left worse than right. Airspace opacity and clustered nodular densities noted in the left upper lobe, new since prior study, most compatible with area of pneumonia. There is a large hiatal hernia containing the proximal half of the stomach and peritoneal fat. This causes compressive atelectasis in both lower lobes. No pleural effusions.  Heart is normal size.  Prior CABG.  No evidence of aortic aneurysm.  No mediastinal, hilar, or axillary adenopathy. Chest wall soft tissues are unremarkable.  No filling defects in the pulmonary arteries to suggest pulmonary emboli.  Imaging into the upper abdomen shows no acute findings. No acute bony abnormality or focal bone lesion.  Review of the MIP images confirms the above findings.  IMPRESSION: Bullous emphysema.  New airspace opacity and nodular clustered densities in the left upper lobe most compatible with pneumonia.  No evidence of pulmonary embolus.  Large hiatal hernia.  Prior CABG.   Electronically Signed   By: Rolm Baptise M.D.   On: 07/01/2015 10:48   US Venous Img Lower Bilateral  07/01/2015   CLINICAL DATA:  74 year old male with 6 day history of bilateral lower extremity swelling.  EXAM: BILATERAL LOWER EXTREMITY VENOUS DOPPLER ULTRASOUND  TECHNIQUE: Gray-scale sonography with graded compression, as well as color Doppler and duplex ultrasound were performed to evaluate the lower  extremity deep venous systems from the level of the common femoral vein and including the common femoral, femoral, profunda femoral, popliteal and calf veins including the posterior tibial, peroneal and gastrocnemius veins when visible. The superficial great saphenous vein was also interrogated. Spectral Doppler was utilized to evaluate flow at rest and with distal augmentation maneuvers in the common femoral, femoral and popliteal veins.  COMPARISON:  None.  FINDINGS: RIGHT LOWER EXTREMITY  Common Femoral Vein: No evidence of thrombus. Normal compressibility, respiratory phasicity and response to augmentation.  Saphenofemoral Junction: No evidence of thrombus. Normal compressibility and flow on color Doppler imaging.  Profunda Femoral Vein: No evidence of thrombus. Normal compressibility and flow on color Doppler imaging.  Femoral Vein: No evidence of thrombus. Normal compressibility, respiratory phasicity and response to augmentation.  Popliteal Vein: No evidence of thrombus. Normal compressibility, respiratory phasicity and response to augmentation.  Calf Veins: No evidence of thrombus. Normal compressibility and flow on color Doppler imaging.  Superficial Great Saphenous Vein: No evidence of thrombus. Normal compressibility and flow on color Doppler imaging.  Venous Reflux:  None.  Other Findings:  Superficial subcutaneous edema throughout the calf.  LEFT LOWER EXTREMITY  Common Femoral Vein: No evidence of thrombus. Normal compressibility, respiratory phasicity and response to augmentation.  Saphenofemoral Junction: No evidence of thrombus. Normal compressibility and flow on color Doppler imaging.  Profunda Femoral Vein: No evidence of thrombus. Normal compressibility and flow on color Doppler imaging.  Femoral Vein: No evidence of thrombus. Normal compressibility, respiratory phasicity and response to augmentation.  Popliteal  Vein: No evidence of thrombus. Normal compressibility, respiratory phasicity and  response to augmentation.  Calf Veins: No evidence of thrombus. Normal compressibility and flow on color Doppler imaging.  Superficial Great Saphenous Vein: No evidence of thrombus. Normal compressibility and flow on color Doppler imaging.  Venous Reflux:  None.  Other Findings:  Superficial subcutaneous edema throughout the calf.  IMPRESSION: No evidence of deep venous thrombosis.   Electronically Signed   By: Jacqulynn Cadet M.D.   On: 07/01/2015 12:12   Dg Chest Portable 1 View  06/30/2015   CLINICAL DATA:  Weakness and cough for 10 days  EXAM: PORTABLE CHEST - 1 VIEW  COMPARISON:  May 23, 2015  FINDINGS: There is persistent consolidation in the left lower lobe region. There is chronic central peribronchial thickening. No new opacity seen. Heart is upper normal in size with pulmonary vascularity within normal limits. No adenopathy.  IMPRESSION: Persistent left lower lobe consolidation. The chronicity of this finding may warrant correlation with chest CT or bronchoscopy to further assess the left lower lobe region. No new opacity. Evidence a degree of underlying chronic bronchitis.   Electronically Signed   By: Lowella Grip III M.D.   On: 06/30/2015 08:20    EKG:   Orders placed or performed during the hospital encounter of 06/30/15  . EKG 12-Lead  . EKG 12-Lead    ASSESSMENT AND PLAN:   1. Acute on chronic diastolic heart failure.  increased Lasix to 80 mg iv bid per cardiologist. venous duplex didn't show any DVT.   2. Abnormal chest x-ray: Chest x-ray shows persistent left lower lobe consolidation. Patient also presented with sinus tachycardia. CT angio of chest is negative for PE but has Bullous emphysema. New airspace opacity and nodular clustered densities in the left upper lobe most compatible with pneumonia   but clinically, the patient has no fever or chills, no leukocytosis, no evidence of pneumonia.   3. Sinus tachycardipossible due to 1. Improved.  4. Diabetes  insulin-dependent: Continue Sliding scale, increase Lantus to 19 units subcutaneous twice a day.  5. 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 Continue Symbicort and Spiriva, continue oxygen by nasal cannula 4 L.   6. 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  7. History of AVM of colon/GI bleed: Hemoglobin is stable. Continue PPI and Carafate. Continue ferrous sulfate  8. All over body pain:  pain control and follow-up pain management physician as outpatient.     All the records are reviewed and case discussed with Care Management/Social Workerr. Management plans discussed with the patient, family and they are in agreement. Greater than 50% of time spent in coordination of care.   CODE STATUS: Full code  TOTAL TIME TAKING CARE OF THIS PATIENT: 43 minutes.   POSSIBLE D/C IN 3 DAYS, DEPENDING ON CLINICAL CONDITION.   Demetrios Loll M.D on 07/01/2015 at 3:48 PM  Between 7am to 6pm - Pager - [phone]  After 6pm go to [email or web address] - password [email or web address] [facility]  Office  [phone]  CC: Primary care physician; Kennon Portela, MD

## 2015-07-01 NOTE — Progress Notes (Addendum)
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  The Left lower lobe opacity likely related to effusion and atalectasis  Acute respiratory secondary to volume overload.  -Continue diuresis.   Chronic respiratory failure due to COPD, deconditioning.  Continue inhaled medications, the patient is on 4L oxygen chronically and he is at that level now.   COPD, not in exacerbation.  -Continue COPD symbicort, spiriva.   Bleb -Large bleb in LUL, likely related to emphysema.   Restrictive lung mechanics due to obesity, large hiatal hernia.  -Weight loss would be beneficial.  -Increase activity level.  -Will check am ABG to look for evidence of hypercapnia.   Large Hiatal Hernia/Intrathoracic stomach.  -He might benefit from fundoplication at some point in the future, but would not appear to be a good candidate at this time due to comorbidites.   He appears stable for discharge from respiratory standpoint, on home level of oxygen.   Date: 07/01/2015  MRN# [id number] [name] 05-Oct-1941   [name] is a 74 y.o. old male seen in follow up for chief complaint of  Chief Complaint  Patient presents with  . Leg Swelling  . Shortness of Breath     HPI:   He appears to be doing well today and in good spirits. He tells me that his leg swelling has gone down considerably.     Pulmonary Functions Testing Results:  No results found for: FEV1, FVC, FEV1FVC, TLC, DLCO   Medication:   No current outpatient prescriptions on file.  Inpatient medications reviewed.   Allergies:  Penicillins; Demerol; and Dilaudid  Review of Systems: Gen:  Denies  fever, sweats. HEENT: Denies blurred vision. Cvc:  No dizziness, chest pain or heaviness Resp:   Denies cough or sputum porduction. Gi: Denies swallowing difficulty, stomach pain.    Gu:  Denies bladder incontinence, burning urine Ext:   No Joint pain, stiffness. Skin: No skin rash, easy bruising. Endoc:  No polyuria, polydipsia. Psych: No depression, insomnia. Other:  All other systems were reviewed and found to be negative other than what is mentioned in the HPI.   Physical Examination:   VS: BP 121/73 mmHg  Pulse 39  Temp(Src) 97.5 F (36.4 C) (Oral)  Resp 19  Ht 6\' 2"  (1.88 m)  Wt 272 lb 14.4 oz (123.787 kg)  BMI 35.02 kg/m2  SpO2 95%  General Appearance: No distress  Neuro:without focal findings,  speech normal,  HEENT: PERRLA, EOM intact. Pulmonary: normal breath sounds, No wheezing.   CardiovascularNormal S1,S2.  No m/r/g.   Abdomen: Benign, Soft, non-tender. Renal:  No costovertebral tenderness  GU:  Not performed at this time. Endoc: No evident thyromegaly, no signs of acromegaly. Skin:   warm, no rash. Extremities: normal, no cyanosis, clubbing.   LABORATORY PANEL:   CBC  Recent Labs Lab 07/01/15 0509  WBC 5.5  HGB 11.0*  HCT 34.3*  PLT 155   ------------------------------------------------------------------------------------------------------------------  Chemistries   Recent Labs Lab 07/01/15 0509  NA 137  K 3.8  CL 90*  CO2 39*  GLUCOSE 253*  BUN 12  CREATININE 1.15  CALCIUM 8.7*  AST 18  ALT 8*  ALKPHOS 80  BILITOT 0.2*   ------------------------------------------------------------------------------------------------------------------  Cardiac Enzymes  Recent Labs Lab 07/01/15 0145  TROPONINI <0.03   ------------------------------------------------------------  RADIOLOGY:    Results for orders  placed during the hospital encounter of 10/15/13  DG Chest 2 View (if patient has fever and/or COPD)   Narrative CLINICAL DATA:  Shortness of Breath  EXAM: CHEST  2 VIEW  COMPARISON:  09/07/2013  FINDINGS: Cardiomediastinal silhouette is stable. Status post CABG. Hyperinflation again noted. Moderate size  hiatal hernia. Mild left basilar atelectasis or scarring. No acute infiltrate or pulmonary edema. Stable bullous changes left apex.  IMPRESSION: Hyperinflation again noted. Moderate size hiatal hernia. Mild left basilar atelectasis or scarring. No acute infiltrate or pulmonary edema. Stable bullous changes left apex.   Electronically Signed   By: Lahoma Crocker M.D.   On: 10/15/2013 14:57    ------------------------------------------------------------------------------------------------------------------  Thank  you for allowing [facility] to assist in the care of your patient. Our recommendations are noted above.  Please contact us if we can be of further service.   Marda Stalker, MD.  [facility] Office Number: [phone]  Patricia Pesa, M.D.  Vilinda Boehringer, M.D.  Merton Border, M.D

## 2015-07-01 NOTE — Progress Notes (Signed)
Patient Name: [name] Date of Encounter: 07/01/2015   Principal Problem:   Acute on chronic diastolic CHF (congestive heart failure) Active Problems:   Acute respiratory distress   Essential hypertension   Diabetes mellitus type II, controlled   COPD (chronic obstructive pulmonary disease)   CAD (coronary artery disease)   AVM (arteriovenous malformation) of colon   Supplemental oxygen dependent   Bipolar 1 disorder, mixed, moderate   Edema   Hyperlipidemia   AAA (abdominal aortic aneurysm)   Major depressive disorder, recurrent, severe without psychotic features    SUBJECTIVE  Breathing much better.  Less edema than yesterday.  No c/p.  CURRENT MEDS . atorvastatin  40 mg Oral QHS  . budesonide-formoterol  2 puff Inhalation BID  . citalopram  40 mg Oral Daily  . divalproex  500 mg Oral QHS  . docusate sodium  100 mg Oral BID  . enoxaparin (LOVENOX) injection  40 mg Subcutaneous Q24H  . ferrous sulfate  650 mg Oral TID WC  . furosemide  80 mg Intravenous BID  . insulin aspart  0-20 Units Subcutaneous TID WC  . insulin aspart  0-5 Units Subcutaneous QHS  . insulin aspart  10 Units Subcutaneous TID WC  . insulin glargine  17 Units Subcutaneous BID  . metoprolol tartrate  25 mg Oral BID  . pantoprazole  40 mg Oral BID  . sodium chloride  10-40 mL Intracatheter Q12H  . sodium chloride  3 mL Intravenous Q12H  . sucralfate  1 g Oral TID WC & HS  . tiotropium  18 mcg Inhalation Daily  . Vitamin D (Ergocalciferol)  50,000 Units Oral Q7 days    OBJECTIVE  Filed Vitals:   06/30/15 1100 06/30/15 1242 06/30/15 1950 07/01/15 0528  BP: 116/71 120/62 119/60 124/66  Pulse: 113 110 79 89  Temp:  98 F (36.7 C) 98.2 F (36.8 C) 97.5 F (36.4 C)  TempSrc:  Oral Oral Oral  Resp: 19 20 20 18   Height:      Weight:  272 lb 14.4 oz (123.787 kg)    SpO2: 93% 95% 94% 97%    Intake/Output Summary (Last 24 hours) at 07/01/15 0837 Last data filed at 07/01/15 0816  Gross  per 24 hour  Intake    263 ml  Output   2925 ml  Net  -2662 ml   Filed Weights   06/30/15 0558 06/30/15 1242  Weight: 272 lb (123.378 kg) 272 lb 14.4 oz (123.787 kg)    PHYSICAL EXAM  General: Pleasant, NAD. Able to lie flat. Neuro: Alert and oriented X 3. Moves all extremities spontaneously. Psych: Normal affect. HEENT:  Normal  Neck: Supple without bruits.  Obese, difficult to gauge JVP. Lungs:  Resp regular and unlabored, diminished breath sounds @ bases. Heart: RRR, distant, no s3, s4, or murmurs. Abdomen: Softer than yesterday, non-tender, non-distended, BS + x 4.  Extremities: No clubbing, cyanosis.  1+ bilat LE edema to ~ 2 inches below knee - improved since yesterday. DP/PT/Radials 2+ and equal bilaterally.  Accessory Clinical Findings  CBC  Recent Labs  06/30/15 0629 07/01/15 0509  WBC 5.8 5.5  HGB 11.0* 11.0*  HCT 34.3* 34.3*  MCV 90.9 90.6  PLT 162 161   Basic Metabolic Panel  Recent Labs  06/30/15 0629 07/01/15 0509  NA 137 137  K 4.8 3.8  CL 88* 90*  CO2 39* 39*  GLUCOSE 302* 253*  BUN 11 12  CREATININE 1.28* 1.15  CALCIUM 8.6*  8.7*   Liver Function Tests  Recent Labs  06/30/15 0629 07/01/15 0509  AST 36 18  ALT 9* 8*  ALKPHOS 88 80  BILITOT 0.9 0.2*  PROT 6.0* 5.8*  ALBUMIN 3.0* 2.9*   Cardiac Enzymes  Recent Labs  06/30/15 0629 06/30/15 1726 07/01/15 0145  TROPONINI 0.03 0.03 <0.03   Hemoglobin A1C  Recent Labs  06/30/15 0629  HGBA1C 7.2*   Thyroid Function Tests  Recent Labs  06/30/15 0629  TSH 4.126    TELE  RSR, intermittent wandering atrial pacemaker.  Radiology/Studies  Dg Chest 1 View  06/30/2015   CLINICAL DATA:  Bedside PICC placement.  EXAM: Portable CHEST  1 VIEW 1738 hr:  COMPARISON:  Portable chest x-ray earlier same date 0719 hr and earlier.  FINDINGS: Right arm PICC tip projects over the lower SVC. Prior sternotomy. Cardiac silhouette mildly to moderately enlarged. Emphysematous changes in the  upper lobes with a large bleb in the left apex. Dense left lower lobe consolidation, unchanged. No new pulmonary parenchymal abnormalities.  IMPRESSION: 1. Right arm PICC tip projects over the lower SVC. 2. Stable dense left lower lobe atelectasis and/or pneumonia. No new abnormalities since earlier today.   Electronically Signed   By: Evangeline Dakin M.D.   On: 06/30/2015 18:02   Dg Chest Portable 1 View  06/30/2015   CLINICAL DATA:  Weakness and cough for 10 days  EXAM: PORTABLE CHEST - 1 VIEW  COMPARISON:  May 23, 2015  FINDINGS: There is persistent consolidation in the left lower lobe region. There is chronic central peribronchial thickening. No new opacity seen. Heart is upper normal in size with pulmonary vascularity within normal limits. No adenopathy.  IMPRESSION: Persistent left lower lobe consolidation. The chronicity of this finding may warrant correlation with chest CT or bronchoscopy to further assess the left lower lobe region. No new opacity. Evidence a degree of underlying chronic bronchitis.   Electronically Signed   By: Lowella Grip III M.D.   On: 06/30/2015 08:20    ASSESSMENT AND PLAN  1.  Acute on chronic diastolic CHF: Feeling much better this AM.  Breathing and edema improving.  No c/p.  Minus 2.6 L so far.  No weight this AM.  BUN/Creat stable.  Cont aggressive IV lasix today.  BP stable.  HR now in 80's.  Awaiting LE u/s this AM.  2.  CAD:  No chest pain.  Trop neg x 3.  No plans for further ischemic eval.  Cont bb, statin.  No ASA 2/2 chronic GI blood loss.  3.  COPD:  Pulm following.  No active wheezing.  4.  Essential HTN:  Stable.    5.  HL: On statin.  LDL 61 in August.  6.  Type II DM:  Per IM.  7.  Chronic GI blood loss/AVM's:  H/H stable.   Signed, Murray Hodgkins NP

## 2015-07-01 NOTE — Progress Notes (Signed)
PT Attempt Note  Patient Details Name: [name] MRN: [id number] DOB: November 03, 1940   Cancelled Treatment:    Reason Eval/Treat Not Completed: Patient at procedure or test/unavailable Chart reviewed and RN consulted earlier in morning. CT angio negative for PE. Pt awaiting LE doppler to rule out DVT. Checked back with RN at 11:00. Pt is out of room for testing. Will continue to follow and attempt evaluation when pt is available.  Lyndel Safe Dalilah Curlin PT, DPT   Henry Utsey 07/01/2015, 11:58 AM

## 2015-07-01 NOTE — Progress Notes (Signed)
Inpatient Diabetes Program Recommendations  AACE/ADA: New Consensus Statement on Inpatient Glycemic Control (2015)  Target Ranges:  Prepandial:   less than 140 mg/dL      Peak postprandial:   less than 180 mg/dL (1-2 hours)      Critically ill patients:  140 - 180 mg/dL   Results for JOVIAN, LEMBCKE (MRN [id number]) as of 07/01/2015 10:02  Ref. Range 06/30/2015 12:50 06/30/2015 16:24 06/30/2015 19:51 07/01/2015 07:26  Glucose-Capillary Latest Ref Range: 65-99 mg/dL 208 (H) 282 (H) 206 (H) 232 (H)   Review of Glycemic Control  Diabetes history: DM2 Outpatient Diabetes medications: Lantus 17 units BID, Novolog 10 units TID with meals Current orders for Inpatient glycemic control: Lantus 17 units BID, Novolog 10 units TID with meals, Novolog 0-20 units TID with meals, Novolog 0-5 units HS  Inpatient Diabetes Program Recommendations: Insulin - Basal: Please consider increasing Lantus to 19 units BID.  Thanks, Barnie Alderman, RN, MSN, CCRN, CDE Diabetes Coordinator Inpatient Diabetes Program [phone] (Team Pager from [place] to [place]) [phone] ([facility] office) [phone] [facility] office) [phone] [facility] office)

## 2015-07-02 DIAGNOSIS — E119 Type 2 diabetes mellitus without complications: Secondary | ICD-10-CM

## 2015-07-02 DIAGNOSIS — M545 Low back pain, unspecified: Secondary | ICD-10-CM

## 2015-07-02 DIAGNOSIS — J439 Emphysema, unspecified: Secondary | ICD-10-CM

## 2015-07-02 DIAGNOSIS — I714 Abdominal aortic aneurysm, without rupture: Secondary | ICD-10-CM

## 2015-07-02 LAB — BLOOD GAS, ARTERIAL
ACID-BASE EXCESS: 16.5 mmol/L — AB (ref 0.0–3.0)
Allens test (pass/fail): POSITIVE — AB
BICARBONATE: 45.2 meq/L — AB (ref 21.0–28.0)
FIO2: 0.36
O2 SAT: 94.3 %
PCO2 ART: 73 mmHg — AB (ref 32.0–48.0)
PO2 ART: 72 mmHg — AB (ref 83.0–108.0)
Patient temperature: 37
pH, Arterial: 7.4 (ref 7.350–7.450)

## 2015-07-02 LAB — GLUCOSE, CAPILLARY
GLUCOSE-CAPILLARY: 244 mg/dL — AB (ref 65–99)
Glucose-Capillary: 225 mg/dL — ABNORMAL HIGH (ref 65–99)
Glucose-Capillary: 212 mg/dL — ABNORMAL HIGH (ref 65–99)
Glucose-Capillary: 158 mg/dL — ABNORMAL HIGH (ref 65–99)

## 2015-07-02 LAB — BASIC METABOLIC PANEL
ANION GAP: 9 (ref 5–15)
BUN: 11 mg/dL (ref 6–20)
CALCIUM: 9 mg/dL (ref 8.9–10.3)
CHLORIDE: 89 mmol/L — AB (ref 101–111)
CO2: 40 mmol/L — AB (ref 22–32)
Creatinine, Ser: 1.23 mg/dL (ref 0.61–1.24)
GFR, EST NON AFRICAN AMERICAN: 56 mL/min — AB (ref >60)
Glucose, Bld: 231 mg/dL — ABNORMAL HIGH (ref 65–99)
POTASSIUM: 4.3 mmol/L (ref 3.5–5.1)
SODIUM: 138 mmol/L (ref 135–145)

## 2015-07-02 LAB — MAGNESIUM: Magnesium: 1.9 mg/dL (ref 1.7–2.4)

## 2015-07-02 MED ORDER — INSULIN GLARGINE 100 UNIT/ML ~~LOC~~ SOLN
21.0000 [IU] | Freq: Two times a day (BID) | SUBCUTANEOUS | Status: DC
  Administered 2015-07-02 – 2015-07-03 (×2): 21 [IU] via SUBCUTANEOUS
  Filled 2015-07-02 (×4): qty 0.21

## 2015-07-02 MED ORDER — LISINOPRIL 5 MG PO TABS
5.0000 mg | ORAL_TABLET | Freq: Every day | ORAL | Status: DC
  Administered 2015-07-03: 5 mg via ORAL
  Filled 2015-07-02: qty 1

## 2015-07-02 NOTE — Progress Notes (Signed)
Patient was made a new patient appointment at the outpatient Heart Failure Clinic on July 16, 2015 at 9:00am. Thank you.

## 2015-07-02 NOTE — Care Management Important Message (Signed)
Important Message  Patient Details  Name: [name] MRN: [id number] Date of Birth: [birth year]   Medicare Important Message Given:  Yes-second notification given    Katrina Stack, RN 07/02/2015, 9:54 AM

## 2015-07-02 NOTE — Progress Notes (Addendum)
[facility] [place] NAME: [name]    MR#:  [id number]  DATE OF BIRTH:  [birth year]  SUBJECTIVE:  CHIEF COMPLAINT:   Chief Complaint  Patient presents with  . Leg Swelling  . Shortness of Breath   better shortness of breath, all oxygen by nasal cannular 4 L. Complains of lower back pain and wants MRI.  REVIEW OF SYSTEMS:  CONSTITUTIONAL: No fever, generalized weakness.  EYES: No blurred or double vision.  EARS, NOSE, AND THROAT: No tinnitus or ear pain.  RESPIRATORY: has cough, shortness of breath, no wheezing or hemoptysis.  CARDIOVASCULAR: No chest pain, orthopnea, bilateral leg edema.  GASTROINTESTINAL: No nausea, vomiting, diarrhea or abdominal pain.  GENITOURINARY: No dysuria, hematuria.  ENDOCRINE: No polyuria, nocturia,  HEMATOLOGY: No anemia, easy bruising or bleeding SKIN: No rash or lesion. MUSCULOSKELETAL: No joint pain or arthritis.   body pain.  NEUROLOGIC: No tingling, numbness, weakness.  PSYCHIATRY: No anxiety or depression.   DRUG ALLERGIES:   Allergies  Allergen Reactions  . Penicillins Anaphylaxis and Hives  . Demerol [Meperidine] Other (See Comments)    Reaction:  Hallucinations    . Dilaudid [Hydromorphone Hcl] Other (See Comments)    Reaction:  Hallucinations     VITALS:  Blood pressure 118/67, pulse 76, temperature 97.8 F (36.6 C), temperature source Oral, resp. rate 16, height 6\' 2"  (1.88 m), weight 124.694 kg (274 lb 14.4 oz), SpO2 93 %.  PHYSICAL EXAMINATION:  GENERAL:  74 y.o.-year-old patient lying in the bed with no acute distress.  obese.  EYES: Pupils equal, round, reactive to light and accommodation. No scleral icterus. Extraocular muscles intact.  HEENT: Head atraumatic, normocephalic. Oropharynx and nasopharynx clear.  NECK:  Supple, no jugular venous distention. No thyroid enlargement, no tenderness.  LUNGS: decreased  breath sounds bilaterally, no wheezing, rales,rhonchi or  crepitation. No use of accessory muscles of respiration.  CARDIOVASCULAR: S1, S2 normal. No murmurs, rubs, or gallops.  ABDOMEN: Soft, nontender, nondistended. Bowel sounds present. No organomegaly or mass.  EXTREMITIES:bilateral lower extremity edema 1+ ,  no cyanosis, or clubbing.  NEUROLOGIC: Cranial nerves II through XII are intact. Muscle strength 5/5 in all extremities. Sensation intact. Gait not checked.  PSYCHIATRIC: The patient is alert and oriented x 3.  SKIN: No obvious rash, lesion, or ulcer.    LABORATORY PANEL:   CBC  Recent Labs Lab 07/01/15 0509  WBC 5.5  HGB 11.0*  HCT 34.3*  PLT 155   ------------------------------------------------------------------------------------------------------------------  Chemistries   Recent Labs Lab 07/01/15 0509 07/02/15 0510  NA 137 138  K 3.8 4.3  CL 90* 89*  CO2 39* 40*  GLUCOSE 253* 231*  BUN 12 11  CREATININE 1.15 1.23  CALCIUM 8.7* 9.0  MG  --  1.9  AST 18  --   ALT 8*  --   ALKPHOS 80  --   BILITOT 0.2*  --    ------------------------------------------------------------------------------------------------------------------  Cardiac Enzymes  Recent Labs Lab 07/01/15 0145  TROPONINI <0.03   ------------------------------------------------------------------------------------------------------------------  RADIOLOGY:  Dg Chest 1 View  06/30/2015   CLINICAL DATA:  Bedside PICC placement.  EXAM: Portable CHEST  1 VIEW 1738 hr:  COMPARISON:  Portable chest x-ray earlier same date 0719 hr and earlier.  FINDINGS: Right arm PICC tip projects over the lower SVC. Prior sternotomy. Cardiac silhouette mildly to moderately enlarged. Emphysematous changes in the upper lobes with a large bleb in the left apex. Dense left lower lobe consolidation, unchanged.  No new pulmonary parenchymal abnormalities.  IMPRESSION: 1. Right arm PICC tip projects over the lower SVC. 2. Stable dense left lower lobe atelectasis and/or pneumonia.  No new abnormalities since earlier today.   Electronically Signed   By: Evangeline Dakin M.D.   On: 06/30/2015 18:02   Ct Angio Chest Pe W/cm &/or Wo Cm  07/01/2015   CLINICAL DATA:  COPD. Increasing lower extremity edema and shortness of breath for past 4 days. Sinus tachycardia.  EXAM: CT ANGIOGRAPHY CHEST WITH CONTRAST  TECHNIQUE: Multidetector CT imaging of the chest was performed using the standard protocol during bolus administration of intravenous contrast. Multiplanar CT image reconstructions and MIPs were obtained to evaluate the vascular anatomy.  CONTRAST:  136mL OMNIPAQUE IOHEXOL 350 MG/ML SOLN  COMPARISON:  Chest CT 05/03/2015  FINDINGS: Bullous emphysematous changes noted in the lungs, left worse than right. Airspace opacity and clustered nodular densities noted in the left upper lobe, new since prior study, most compatible with area of pneumonia. There is a large hiatal hernia containing the proximal half of the stomach and peritoneal fat. This causes compressive atelectasis in both lower lobes. No pleural effusions.  Heart is normal size.  Prior CABG.  No evidence of aortic aneurysm.  No mediastinal, hilar, or axillary adenopathy. Chest wall soft tissues are unremarkable.  No filling defects in the pulmonary arteries to suggest pulmonary emboli.  Imaging into the upper abdomen shows no acute findings. No acute bony abnormality or focal bone lesion.  Review of the MIP images confirms the above findings.  IMPRESSION: Bullous emphysema.  New airspace opacity and nodular clustered densities in the left upper lobe most compatible with pneumonia.  No evidence of pulmonary embolus.  Large hiatal hernia.  Prior CABG.   Electronically Signed   By: Rolm Baptise M.D.   On: 07/01/2015 10:48   US Venous Img Lower Bilateral  07/01/2015   CLINICAL DATA:  74 year old male with 6 day history of bilateral lower extremity swelling.  EXAM: BILATERAL LOWER EXTREMITY VENOUS DOPPLER ULTRASOUND  TECHNIQUE: Gray-scale  sonography with graded compression, as well as color Doppler and duplex ultrasound were performed to evaluate the lower extremity deep venous systems from the level of the common femoral vein and including the common femoral, femoral, profunda femoral, popliteal and calf veins including the posterior tibial, peroneal and gastrocnemius veins when visible. The superficial great saphenous vein was also interrogated. Spectral Doppler was utilized to evaluate flow at rest and with distal augmentation maneuvers in the common femoral, femoral and popliteal veins.  COMPARISON:  None.  FINDINGS: RIGHT LOWER EXTREMITY  Common Femoral Vein: No evidence of thrombus. Normal compressibility, respiratory phasicity and response to augmentation.  Saphenofemoral Junction: No evidence of thrombus. Normal compressibility and flow on color Doppler imaging.  Profunda Femoral Vein: No evidence of thrombus. Normal compressibility and flow on color Doppler imaging.  Femoral Vein: No evidence of thrombus. Normal compressibility, respiratory phasicity and response to augmentation.  Popliteal Vein: No evidence of thrombus. Normal compressibility, respiratory phasicity and response to augmentation.  Calf Veins: No evidence of thrombus. Normal compressibility and flow on color Doppler imaging.  Superficial Great Saphenous Vein: No evidence of thrombus. Normal compressibility and flow on color Doppler imaging.  Venous Reflux:  None.  Other Findings:  Superficial subcutaneous edema throughout the calf.  LEFT LOWER EXTREMITY  Common Femoral Vein: No evidence of thrombus. Normal compressibility, respiratory phasicity and response to augmentation.  Saphenofemoral Junction: No evidence of thrombus. Normal compressibility and flow on color  Doppler imaging.  Profunda Femoral Vein: No evidence of thrombus. Normal compressibility and flow on color Doppler imaging.  Femoral Vein: No evidence of thrombus. Normal compressibility, respiratory phasicity and  response to augmentation.  Popliteal Vein: No evidence of thrombus. Normal compressibility, respiratory phasicity and response to augmentation.  Calf Veins: No evidence of thrombus. Normal compressibility and flow on color Doppler imaging.  Superficial Great Saphenous Vein: No evidence of thrombus. Normal compressibility and flow on color Doppler imaging.  Venous Reflux:  None.  Other Findings:  Superficial subcutaneous edema throughout the calf.  IMPRESSION: No evidence of deep venous thrombosis.   Electronically Signed   By: Jacqulynn Cadet M.D.   On: 07/01/2015 12:12    EKG:   Orders placed or performed during the hospital encounter of 06/30/15  . EKG 12-Lead  . EKG 12-Lead    ASSESSMENT AND PLAN:   1. Acute on chronic diastolic heart failure. LV EF: 55% -  60% increased Lasix to 80 mg iv bid per cardiologist. venous duplex didn't show any DVT.   2. Abnormal chest x-ray: Chest x-ray shows persistent left lower lobe consolidation. Patient also presented with sinus tachycardia. CT angio of chest is negative for PE but has Bullous emphysema. New airspace opacity and nodular clustered densities in the left upper lobe most compatible with pneumonia   but clinically, the patient has no fever or chills, no leukocytosis, no evidence of pneumonia.   3. Sinus tachycardipossible due to 1. Improved.  4. Diabetes insulin-dependent: Continue Sliding scale, increase Lantus to 21 units subcutaneous twice a day, continue NovoLog 10 units subcutaneous 3 times a day before meals.  5. 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 Continue Symbicort and Spiriva, continue oxygen by nasal cannula 4 L.   6. 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  7. History of AVM of colon/GI bleed: Hemoglobin is stable. Continue PPI and Carafate. Continue ferrous sulfate  8. All over  body pain:  pain control and follow-up pain management physician as outpatient. Chronic back pain. The patient wants lumbar MRI the patient has contraindication for MRI according to previous records.   All the records are reviewed and case discussed with Care Management/Social Workerr. Management plans discussed with the patient, family and they are in agreement. Greater than 50% of time spent in coordination of care.  I discussed with the physical therapist. The patient needs home health and PT.  CODE STATUS: Full code  TOTAL TIME TAKING CARE OF THIS PATIENT: 42 minutes.   POSSIBLE D/C IN 3 DAYS, DEPENDING ON CLINICAL CONDITION.   Demetrios Loll M.D on 07/02/2015 at 2:59 PM  Between 7am to 6pm - Pager - [phone]  After 6pm go to [email or web address] - password [email or web address] [facility]  Office  [phone]  CC: Primary care physician; Kennon Portela, MD

## 2015-07-02 NOTE — Progress Notes (Signed)
Patient Name: [name] Date of Encounter: 07/02/2015     Principal Problem:   Acute on chronic diastolic CHF (congestive heart failure) Active Problems:   Acute respiratory distress   Essential hypertension   Diabetes mellitus type II, controlled   COPD (chronic obstructive pulmonary disease)   CAD (coronary artery disease)   AVM (arteriovenous malformation) of colon   Supplemental oxygen dependent   Bipolar 1 disorder, mixed, moderate   Edema   Low back pain   Hyperlipidemia   AAA (abdominal aortic aneurysm)   Major depressive disorder, recurrent, severe without psychotic features   Shortness of breath   Swelling    SUBJECTIVE  Breathing and edema continue to improve.  Biggest concern is low back pain, which is chronic.  Apparently told in past that he will need MRI and he would like this done while he is here.  CURRENT MEDS . atorvastatin  40 mg Oral QHS  . budesonide-formoterol  2 puff Inhalation BID  . citalopram  40 mg Oral Daily  . divalproex  500 mg Oral QHS  . docusate sodium  100 mg Oral BID  . enoxaparin (LOVENOX) injection  40 mg Subcutaneous Q24H  . ferrous sulfate  650 mg Oral TID WC  . furosemide  80 mg Intravenous BID  . insulin aspart  0-20 Units Subcutaneous TID WC  . insulin aspart  0-5 Units Subcutaneous QHS  . insulin aspart  10 Units Subcutaneous TID WC  . insulin glargine  19 Units Subcutaneous BID  . metoprolol tartrate  25 mg Oral BID  . pantoprazole  40 mg Oral BID  . sodium chloride  10-40 mL Intracatheter Q12H  . sodium chloride  3 mL Intravenous Q12H  . sucralfate  1 g Oral TID WC & HS  . tiotropium  18 mcg Inhalation Daily  . Vitamin D (Ergocalciferol)  50,000 Units Oral Q7 days    OBJECTIVE  Filed Vitals:   07/01/15 0528 07/01/15 1156 07/01/15 2039 07/02/15 0459  BP: 124/66 121/73 120/63 127/61  Pulse: 89 39 81 83  Temp: 97.5 F (36.4 C)  98.3 F (36.8 C)   TempSrc: Oral     Resp: 18 19 20 20   Height:      Weight:       SpO2: 97% 95% 95% 98%    Intake/Output Summary (Last 24 hours) at 07/02/15 0849 Last data filed at 07/02/15 0700  Gross per 24 hour  Intake      0 ml  Output    600 ml  Net   -600 ml   Filed Weights   06/30/15 0558 06/30/15 1242  Weight: 272 lb (123.378 kg) 272 lb 14.4 oz (123.787 kg)    PHYSICAL EXAM  General: Pleasant, NAD. Neuro: Alert and oriented X 3. Moves all extremities spontaneously. Psych: Normal affect. HEENT:  Normal  Neck: Supple without bruits.  Difficult to assess JVP 2/2 girth. Lungs:  Resp regular and unlabored, diminished breath sounds @ bases - overall improved. Heart: RRR no s3, s4, or murmurs. Abdomen: Semi-firm - feels fuller than it did yesterday, non-tender, BS + x 4.  Extremities: No clubbing, cyanosis.  1-2+ bilat LE edema. DP/PT/Radials 2+ and equal bilaterally.  Accessory Clinical Findings  CBC  Recent Labs  06/30/15 0629 07/01/15 0509  WBC 5.8 5.5  HGB 11.0* 11.0*  HCT 34.3* 34.3*  MCV 90.9 90.6  PLT 162 532   Basic Metabolic Panel  Recent Labs  07/01/15 0509 07/02/15 0510  NA  137 138  K 3.8 4.3  CL 90* 89*  CO2 39* 40*  GLUCOSE 253* 231*  BUN 12 11  CREATININE 1.15 1.23  CALCIUM 8.7* 9.0  MG  --  1.9   Liver Function Tests  Recent Labs  06/30/15 0629 07/01/15 0509  AST 36 18  ALT 9* 8*  ALKPHOS 88 80  BILITOT 0.9 0.2*  PROT 6.0* 5.8*  ALBUMIN 3.0* 2.9*   Cardiac Enzymes  Recent Labs  06/30/15 0629 06/30/15 1726 07/01/15 0145  TROPONINI 0.03 0.03 <0.03   Hemoglobin A1C  Recent Labs  06/30/15 0629  HGBA1C 7.2*   Thyroid Function Tests  Recent Labs  06/30/15 0629  TSH 4.126    TELE  RSR, PAC's - 80's to 90's.  Radiology/Studies  Ct Angio Chest Pe W/cm &/or Wo Cm  07/01/2015   CLINICAL DATA:  COPD. Increasing lower extremity edema and shortness of breath for past 4 days. Sinus tachycardia.  EXAM: CT ANGIOGRAPHY CHEST WITH CONTRAST   IMPRESSION: Bullous emphysema.  New airspace opacity  and nodular clustered densities in the left upper lobe most compatible with pneumonia.  No evidence of pulmonary embolus.  Large hiatal hernia.  Prior CABG.   Electronically Signed   By: Rolm Baptise M.D.   On: 07/01/2015 10:48   US Venous Img Lower Bilateral  07/01/2015   CLINICAL DATA:  74 year old male with 6 day history of bilateral lower extremity swelling.  EXAM: BILATERAL LOWER EXTREMITY VENOUS DOPPLER ULTRASOUND   IMPRESSION: No evidence of deep venous thrombosis.   Electronically Signed   By: Jacqulynn Cadet M.D.   On: 07/01/2015 12:12   Dg Chest Portable 1 View  06/30/2015   CLINICAL DATA:  Weakness and cough for 10 days  EXAM: PORTABLE CHEST - 1 VIEW   IMPRESSION: Persistent left lower lobe consolidation. The chronicity of this finding may warrant correlation with chest CT or bronchoscopy to further assess the left lower lobe region. No new opacity. Evidence a degree of underlying chronic bronchitis.   Electronically Signed   By: Lowella Grip III M.D.   On: 06/30/2015 08:20    ASSESSMENT AND PLAN  1. Acute on chronic diastolic CHF: Feeling much better this AM. Breathing improving.  Persistent bilat LEE and abd fullness. No c/p. Minus 3.2 L so far. No weight this AM (again). Creat and bicarb beginning to bump. Cont aggressive IV lasix today. Add TEDS.  BP/HR stable. He needs to be weighed so that we can gauge how close we are to his dry weight.  2. CAD: No chest pain. Trop neg x 3. No plans for further ischemic eval. Cont bb, statin. No ASA 2/2 chronic GI blood loss.  3. COPD/LLL Consolidation: Pulm following. Afebrile.  Nl WBC.  No active wheezing. No abx per IM.  4. Essential HTN: Stable.   5. HL: On statin. LDL 61 in August.  6. Type II DM: Per IM.  7. Chronic GI blood loss/AVM's: H/H stable.   8.  Chronic low back pain:  This is his biggest concern this AM.  He has been receiving vicodin, which has helped, but says that he is scared of becoming  addicted to narcotics.  He has apparently been told in the past that he will need an MRI to define the underlying issue.  He would like this performed while he is an inpt.  Defer to IM.  Signed, Murray Hodgkins NP    Attending Note Patient seen and examined, agree with detailed note  above,  Patient presentation and plan discussed on rounds.   Continued leg edema, slowly improving with diuresis Still pitting edema up to the thighs, ABD swelling. Still with back discomfort and would like workup, MRI. Improved SOB. Has not ambulated far secondary to back pain.  --Suspect he will need several more days of diuresis given continued pitting edema Renal function stable so far. Would monitor daily.  --Will start low dose ACE inh  Signed: Esmond Plants  M.D., Ph.D.

## 2015-07-02 NOTE — Evaluation (Signed)
Physical Therapy Evaluation Patient Details Name: [name] MRN: [id number] DOB: 24-Dec-1940 Today's Date: 07/02/2015   History of Present Illness  74 yo male with onset of SOB and severe LE edema, now exacerbation of CHF.  PMHx:  COPD, CHF, AAA, MI, CABG, bipolar disorder  Clinical Impression  Pt was able to walk more comfortably this stay, and is in fact looking much more stable.  Pt is approprate for dc home with HHPT, and is going to have continual assistance.      Follow Up Recommendations Home health PT    Equipment Recommendations  None recommended by PT    Recommendations for Other Services       Precautions / Restrictions Precautions Precautions: Fall Precaution Comments: oxygen 4L Restrictions Weight Bearing Restrictions: No      Mobility  Bed Mobility Overal bed mobility: Modified Independent                Transfers Overall transfer level: Modified independent Equipment used: Rolling walker (2 wheeled)             General transfer comment: safe with RW  Ambulation/Gait Ambulation/Gait assistance: Min guard Ambulation Distance (Feet): 150 Feet Assistive device: Rolling walker (2 wheeled) Gait Pattern/deviations: Step-through pattern;Decreased stride length;Trunk flexed;Wide base of support Gait velocity: reduced Gait velocity interpretation: Below normal speed for age/gender General Gait Details: assistance with O2 tank and for safety due to setting limits for pt  Stairs            Wheelchair Mobility    Modified Rankin (Stroke Patients Only)       Balance Overall balance assessment: Modified Independent                                           Pertinent Vitals/Pain Pain Assessment: No/denies pain    Home Living Family/patient expects to be discharged to:: Private residence Living Arrangements: Spouse/significant other Available Help at Discharge: Family Type of Home: House Home Access: Stairs to  enter Entrance Stairs-Rails: Left Entrance Stairs-Number of Steps: 2 Home Layout: One level Home Equipment: Walker - 2 wheels;Shower seat      Prior Function Level of Independence: Independent with assistive device(s)         Comments: RW as needed     Hand Dominance   Dominant Hand: Right    Extremity/Trunk Assessment   Upper Extremity Assessment: Overall WFL for tasks assessed           Lower Extremity Assessment: Generalized weakness      Cervical / Trunk Assessment: Normal  Communication   Communication: HOH  Cognition Arousal/Alertness: Awake/alert Behavior During Therapy: WFL for tasks assessed/performed Overall Cognitive Status: Within Functional Limits for tasks assessed                      General Comments General comments (skin integrity, edema, etc.): Pt is wanting to get OOB alone with extended O2 line and discussed with nursing as he is needing O2.  Nurse declined the extension    Exercises        Assessment/Plan    PT Assessment Patient needs continued PT services  PT Diagnosis Generalized weakness   PT Problem List Decreased strength;Decreased range of motion;Decreased activity tolerance;Decreased balance;Decreased mobility;Decreased coordination;Cardiopulmonary status limiting activity;Obesity  PT Treatment Interventions DME instruction;Gait training;Stair training;Functional mobility training;Therapeutic activities;Therapeutic exercise;Balance training;Neuromuscular re-education;Patient/family education  PT Goals (Current goals can be found in the Care Plan section) Acute Rehab PT Goals Patient Stated Goal: to get home when able PT Goal Formulation: With patient Time For Goal Achievement: 07/16/15 Potential to Achieve Goals: Good    Frequency Min 2X/week   Barriers to discharge   none due to wife being home    Co-evaluation               End of Session Equipment Utilized During Treatment: Gait  belt;Oxygen Activity Tolerance: Patient tolerated treatment well Patient left: in bed;with call bell/phone within reach;Other (comment) (refused to be up in chair) Nurse Communication: Mobility status         Time: 6160-7371 PT Time Calculation (min) (ACUTE ONLY): 32 min   Charges:   PT Evaluation $Initial PT Evaluation Tier I: 1 Procedure PT Treatments $Gait Training: 8-22 mins   PT G Codes:        [name] 2015/07/07, 12:08 PM   [name], PT MS Acute Rehab Dept. Number: [phone] [phone] and [place] [phone]

## 2015-07-02 NOTE — Care Management (Signed)
Patient adamantly declines any discussion regarding skilled nursing placement.  He prefers to discharge home and have his home health services resumes.  Will discuss the possibility of daily nursing visits with home health agency for at least the first 5-7 days before decreasing visit frequency

## 2015-07-02 NOTE — Progress Notes (Signed)
Inpatient Diabetes Program Recommendations  AACE/ADA: New Consensus Statement on Inpatient Glycemic Control (2015)  Target Ranges:  Prepandial:   less than 140 mg/dL      Peak postprandial:   less than 180 mg/dL (1-2 hours)      Critically ill patients:  140 - 180 mg/dL   Review of Glycemic Control  Diabetes history: DM2 Outpatient Diabetes medications: Lantus 17 units BID, Novolog 10 units TID with meals Current orders for Inpatient glycemic control: Lantus 19 units BID, Novolog 10 units TID with meals, Novolog 0-20 units TID with meals, Novolog 0-5 units HS  Inpatient Diabetes Program Recommendations: Insulin - Basal: Please consider increasing Lantus to 22 units BID. Insulin - Meal Coverage: Please consider increasing Novolog meal coverage to Novolog 14 units TID with meals.  Thanks, Barnie Alderman, RN, MSN, CCRN, CDE Diabetes Coordinator Inpatient Diabetes Program [phone] (Team Pager from [place] to [place]) [phone] ([place] office) [phone] [facility] office) [phone] [facility] office)

## 2015-07-02 NOTE — Progress Notes (Signed)
Initial Nutrition Assessment   INTERVENTION:   Meals and Snacks: Cater to patient preferences Education: Will attempt to review/educate  Low Sodium Nutrition therapy and diabetic nutrition therapy as able.   NUTRITION DIAGNOSIS:   No nutrition diagnosis at this time.  GOAL:   Patient will meet greater than or equal to 90% of their needs  MONITOR:    (Energy Intake, Anthropometrics, Glucose Profile)  REASON FOR ASSESSMENT:   Diagnosis    ASSESSMENT:   Pt admitted with acute on chronic CHF with lower extremity swelling. Pt unavailable on visit.  Past Medical History  Diagnosis Date  . Allergic rhinitis, cause unspecified   . Hyperlipidemia   . CAD (coronary artery disease)     a. 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  . Essential hypertension   . COPD (chronic obstructive pulmonary disease)     a. GOLD stage IV, started home O2. Severe bullous disease of LUL. Prolonged intubation after surgeries due to COPD.  Marland Kitchen AAA (abdominal aortic aneurysm)     a. 12/2008 s/p 7cm, endovascular repair with coiling right hypogastric artery   . Recurrent Microcytic Anemia     a. presumed chronic GI blood loss.  . Memory loss   . Type II diabetes mellitus   . Chronic diastolic CHF (congestive heart failure)     a. 04/2015 Echo: EF 55-60%, no rwma, Gr 1 DD, mild AI.  Marland Kitchen GI bleed requiring more than 4 units of blood in 24 hours, ICU, or surgery     a. Hx bleeding gastric polyps, cecal & sigmoid AVMS s/p APC 03/30/14  . AVM (arteriovenous malformation) of colon with hemorrhage   . Multiple gastric polyps   . Diverticulosis   . Essential hypertension 08/18/2009    Qualifier: Diagnosis of  By: Doy Mince LPN, Megan    . Anxiety state 09/10/2013  . Depression 01/14/2013  . Vitamin D deficiency 08/10/2014  . Leucocytosis 12/04/2013  . Insomnia 08/10/2014  . Bipolar 1 disorder, mixed, moderate 04/16/2015  . Morbid obesity     Diet Order:  Diet heart healthy/carb  modified Room service appropriate?: Yes; Fluid consistency:: Thin    Current Nutrition: Recorded po intake 67% of meals on average since admission.   Food/Nutrition-Related History: Per MST no decrease in appetite PTA.   Medications: Lasix, Novolog, Lantus, Protonix, Carafate, vitamin D  Electrolyte/Renal Profile and Glucose Profile:   Recent Labs Lab 06/30/15 0629 07/01/15 0509 07/02/15 0510  NA 137 137 138  K 4.8 3.8 4.3  CL 88* 90* 89*  CO2 39* 39* 40*  BUN 11 12 11   CREATININE 1.28* 1.15 1.23  CALCIUM 8.6* 8.7* 9.0  MG  --   --  1.9  GLUCOSE 302* 253* 231*   Protein Profile:  Recent Labs Lab 06/30/15 0629 07/01/15 0509  ALBUMIN 3.0* 2.9*    Gastrointestinal Profile: Last BM:  06/30/2015   Weight Change: Pt with weight gain per CHL   Skin:  Reviewed, no issues  Height:   Ht Readings from Last 1 Encounters:  06/30/15 6\' 2"  (1.88 m)    Weight:   Wt Readings from Last 1 Encounters:  07/02/15 274 lb 14.4 oz (124.694 kg)   Wt Readings from Last 10 Encounters:  07/02/15 274 lb 14.4 oz (124.694 kg)  05/28/15 264 lb (119.75 kg)  05/23/15 273 lb 1.6 oz (123.877 kg)  05/11/15 262 lb (118.842 kg)  05/04/15 266 lb 1.6 oz (120.702 kg)  04/27/15 260 lb 3.2 oz (  118.026 kg)  04/17/15 258 lb (117.028 kg)  04/10/15 258 lb 12.8 oz (117.391 kg)  03/23/15 262 lb (118.842 kg)  03/09/15 262 lb (118.842 kg)    BMI:  Body mass index is 35.28 kg/(m^2).   EDUCATION NEEDS:   Education needs no appropriate at this time   [place], [place], LDN Pager [phone]

## 2015-07-03 ENCOUNTER — Telehealth: Payer: Self-pay

## 2015-07-03 LAB — BASIC METABOLIC PANEL
ANION GAP: 6 (ref 5–15)
BUN: 13 mg/dL (ref 6–20)
CHLORIDE: 91 mmol/L — AB (ref 101–111)
CO2: 42 mmol/L — ABNORMAL HIGH (ref 22–32)
Calcium: 8.4 mg/dL — ABNORMAL LOW (ref 8.9–10.3)
Creatinine, Ser: 1.12 mg/dL (ref 0.61–1.24)
GFR calc Af Amer: >60 mL/min (ref >60)
Glucose, Bld: 221 mg/dL — ABNORMAL HIGH (ref 65–99)
POTASSIUM: 3.7 mmol/L (ref 3.5–5.1)
SODIUM: 139 mmol/L (ref 135–145)

## 2015-07-03 LAB — GLUCOSE, CAPILLARY
GLUCOSE-CAPILLARY: 176 mg/dL — AB (ref 65–99)
Glucose-Capillary: 181 mg/dL — ABNORMAL HIGH (ref 65–99)

## 2015-07-03 MED ORDER — FUROSEMIDE 40 MG PO TABS: 40.0000 mg | ORAL_TABLET | Freq: Two times a day (BID) | ORAL | Status: DC

## 2015-07-03 MED ORDER — LISINOPRIL 5 MG PO TABS: 5.0000 mg | ORAL_TABLET | Freq: Every day | ORAL | Status: DC

## 2015-07-03 MED ORDER — FUROSEMIDE 40 MG PO TABS: 80.0000 mg | ORAL_TABLET | Freq: Two times a day (BID) | ORAL | Status: DC

## 2015-07-03 MED ORDER — INSULIN GLARGINE 100 UNIT/ML ~~LOC~~ SOLN: 20.0000 [IU] | Freq: Two times a day (BID) | SUBCUTANEOUS | Status: DC

## 2015-07-03 NOTE — Discharge Summary (Signed)
[facility] NAME: [name]    MR#:  [id number]  DATE OF BIRTH:  02-23-1941  DATE OF ADMISSION:  06/30/2015 ADMITTING PHYSICIAN: Bettey Costa, MD  DATE OF DISCHARGE: 07/03/2015 12:25 PM  PRIMARY CARE PHYSICIAN: Kennon Portela, MD    ADMISSION DIAGNOSIS:  Shortness of breath [R06.02] Tachycardia [R00.0]   DISCHARGE DIAGNOSIS:   Acute on chronic diastolic CHF ejection fraction 55-60%. SECONDARY DIAGNOSIS:   Past Medical History  Diagnosis Date  . Allergic rhinitis, cause unspecified   . Hyperlipidemia   . CAD (coronary artery disease)     a. 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  . Essential hypertension   . COPD (chronic obstructive pulmonary disease)     a. GOLD stage IV, started home O2. Severe bullous disease of LUL. Prolonged intubation after surgeries due to COPD.  Marland Kitchen AAA (abdominal aortic aneurysm)     a. 12/2008 s/p 7cm, endovascular repair with coiling right hypogastric artery   . Recurrent Microcytic Anemia     a. presumed chronic GI blood loss.  . Memory loss   . Type II diabetes mellitus   . Chronic diastolic CHF (congestive heart failure)     a. 04/2015 Echo: EF 55-60%, no rwma, Gr 1 DD, mild AI.  Marland Kitchen GI bleed requiring more than 4 units of blood in 24 hours, ICU, or surgery     a. Hx bleeding gastric polyps, cecal & sigmoid AVMS s/p APC 03/30/14  . AVM (arteriovenous malformation) of colon with hemorrhage   . Multiple gastric polyps   . Diverticulosis   . Essential hypertension 08/18/2009    Qualifier: Diagnosis of  By: Doy Mince LPN, Megan    . Anxiety state 09/10/2013  . Depression 01/14/2013  . Vitamin D deficiency 08/10/2014  . Leucocytosis 12/04/2013  . Insomnia 08/10/2014  . Bipolar 1 disorder, mixed, moderate 04/16/2015  . Morbid obesity     HOSPITAL COURSE:   1. Acute on chronic diastolic heart failure. LV EF: 55% -  60% After admission, the patient was  treated with Lasix which was increased to 80 mg iv bid per cardiologist. venous duplex didn't show any DVT. The patient the symptoms has much improved. Leg edema decreased to 1+. According to Dr. Fletcher Anon, Lasix will be changed to 40 mg by mouth twice a day after discharge.  2. Abnormal chest x-ray: Chest x-ray shows persistent left lower lobe consolidation. Patient also presented with sinus tachycardia. CT angio of chest is negative for PE but has Bullous emphysema. New airspace opacity and nodular clustered densities in the left upper lobe most compatible with pneumonia  but clinically, the patient has no fever or chills, no leukocytosis, no evidence of pneumonia.   3. Sinus tachycardipossible due to 1. Improved.  4. Diabetes insulin-dependent: Continue Sliding scale, increase Lantus to 21 units subcutaneous twice a day, continue NovoLog 10 units subcutaneous 3 times a day before meals.  5. 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 He has been treated with Symbicort and Spiriva, continue oxygen by nasal cannula 4 L.   6. 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  7. History of AVM of colon/GI bleed: Hemoglobin is stable. Continue PPI and Carafate. Continue ferrous sulfate  Per PT evaluation, the patient needs home health and PT.  DISCHARGE CONDITIONS:   Stable,  the patient was discharged to home with home health and PT today.  CONSULTS OBTAINED:  Treatment Team:  Minna Merritts, MD  DRUG ALLERGIES:   Allergies  Allergen Reactions  . Penicillins Anaphylaxis and Hives  . Demerol [Meperidine] Other (See Comments)    Reaction:  Hallucinations    . Dilaudid [Hydromorphone Hcl] Other (See Comments)    Reaction:  Hallucinations     DISCHARGE MEDICATIONS:   Discharge Medication List as of 07/03/2015  2:24 PM    START taking these medications    Details  lisinopril (PRINIVIL,ZESTRIL) 5 MG tablet Take 1 tablet (5 mg total) by mouth daily., Starting 07/03/2015, Until Discontinued, Print      CONTINUE these medications which have CHANGED   Details  furosemide (LASIX) 40 MG tablet Take 1 tablet (40 mg total) by mouth 2 (two) times daily., Starting 07/03/2015, Until Discontinued, Print    insulin glargine (LANTUS) 100 UNIT/ML injection Inject 0.2 mLs (20 Units total) into the skin 2 (two) times daily., Starting 07/03/2015, Until Discontinued, Normal      CONTINUE these medications which have NOT CHANGED   Details  acetaminophen (TYLENOL) 325 MG tablet Take 2 tablets (650 mg total) by mouth every 6 (six) hours as needed for mild pain (or Fever >/= 101)., Starting 04/27/2015, Until Discontinued, OTC    albuterol (PROVENTIL HFA;VENTOLIN HFA) 108 (90 BASE) MCG/ACT inhaler Inhale 2 puffs into the lungs every 6 (six) hours as needed for wheezing or shortness of breath., Starting 05/13/2015, Until Discontinued, Print    ALPRAZolam (XANAX) 0.5 MG tablet Take 1 tablet (0.5 mg total) by mouth 3 (three) times daily as needed for anxiety (for shortness of breath)., Starting 05/13/2015, Until Discontinued, Print    atorvastatin (LIPITOR) 40 MG tablet Take 1 tablet (40 mg total) by mouth at bedtime. <please make appointment for refills>, Starting 05/13/2015, Until Discontinued, Normal    budesonide-formoterol (SYMBICORT) 160-4.5 MCG/ACT inhaler Inhale 2 puffs into the lungs 2 (two) times daily., Starting 05/13/2015, Until Discontinued, Normal    citalopram (CELEXA) 40 MG tablet Take 1 tablet (40 mg total) by mouth daily., Starting 05/13/2015, Until Discontinued, Normal    divalproex (DEPAKOTE) 500 MG DR tablet Take 1 tablet (500 mg total) by mouth at bedtime., Starting 05/13/2015, Until Discontinued, Print    docusate sodium (COLACE) 100 MG capsule Take 1 capsule (100 mg total) by mouth 2 (two) times daily., Starting 05/13/2015, Until Discontinued, Normal     ferrous sulfate 325 (65 FE) MG tablet Take 2 tablets (650 mg total) by mouth 3 (three) times daily with meals., Starting 05/13/2015, Until Discontinued, Normal    HYDROcodone-acetaminophen (NORCO/VICODIN) 5-325 MG per tablet Take 1 tablet by mouth every 6 (six) hours as needed for moderate pain., Until Discontinued, Historical Med    insulin aspart (NOVOLOG) 100 UNIT/ML injection Inject 10 Units into the skin 3 (three) times daily with meals., Starting 05/13/2015, Until Discontinued, Normal    metoprolol tartrate (LOPRESSOR) 25 MG tablet Take 1 tablet (25 mg total) by mouth 2 (two) times daily., Starting 05/23/2015, Until Discontinued, Normal    pantoprazole (PROTONIX) 40 MG tablet Take 1 tablet (40 mg total) by mouth 2 (two) times daily., Starting 05/13/2015, Until Discontinued, Normal    potassium chloride (K-DUR,KLOR-CON) 10 MEQ tablet Take 10 mEq by mouth daily., Until Discontinued, Historical Med    sucralfate (CARAFATE) 1 G tablet Take 1 tablet (1 g total) by mouth 4 (four) times daily -  with meals and at bedtime., Starting 05/13/2015, Until  Discontinued, Normal    tiotropium (SPIRIVA) 18 MCG inhalation capsule Place 1 capsule (18 mcg total) into inhaler and inhale daily., Starting 05/13/2015, Until Discontinued, Normal    Vitamin D, Ergocalciferol, (DRISDOL) 50000 UNITS CAPS capsule Take 1 capsule (50,000 Units total) by mouth every 7 (seven) days. Pt takes on Monday., Starting 05/13/2015, Until Discontinued, Normal    zolpidem (AMBIEN) 5 MG tablet TAKE 1 TABLET BY MOUTH AT BEDTIME AS NEEDED FOR SLEEP, Print      STOP taking these medications     torsemide (DEMADEX) 20 MG tablet          DISCHARGE INSTRUCTIONS:    If you experience worsening of your admission symptoms, develop shortness of breath, life threatening emergency, suicidal or homicidal thoughts you must seek medical attention immediately by calling 911 or calling your MD immediately  if symptoms less severe.  You Must  read complete instructions/literature along with all the possible adverse reactions/side effects for all the Medicines you take and that have been prescribed to you. Take any new Medicines after you have completely understood and accept all the possible adverse reactions/side effects.   Please note  You were cared for by a hospitalist during your hospital stay. If you have any questions about your discharge medications or the care you received while you were in the hospital after you are discharged, you can call the unit and asked to speak with the hospitalist on call if the hospitalist that took care of you is not available. Once you are discharged, your primary care physician will handle any further medical issues. Please note that NO REFILLS for any discharge medications will be authorized once you are discharged, as it is imperative that you return to your primary care physician (or establish a relationship with a primary care physician if you do not have one) for your aftercare needs so that they can reassess your need for medications and monitor your lab values.    Today   SUBJECTIVE   The patient felt much better. On oxygen by nasal cannular 4 L.   VITAL SIGNS:  Blood pressure 107/60, pulse 86, temperature 97.4 F (36.3 C), temperature source Oral, resp. rate 20, height 6\' 2"  (1.88 m), weight 122.97 kg (271 lb 1.6 oz), SpO2 93 %.  I/O:   Intake/Output Summary (Last 24 hours) at 07/03/15 1659 Last data filed at 07/03/15 1100  Gross per 24 hour  Intake    240 ml  Output   2725 ml  Net  -2485 ml    PHYSICAL EXAMINATION:  GENERAL:  74 y.o.-year-old patient lying in the bed with no acute distress.  EYES: Pupils equal, round, reactive to light and accommodation. No scleral icterus. Extraocular muscles intact.  HEENT: Head atraumatic, normocephalic. Oropharynx and nasopharynx clear.  NECK:  Supple, no jugular venous distention. No thyroid enlargement, no tenderness.  LUNGS: Very  diminished breath sounds bilaterally, no wheezing, rales,rhonchi or crepitation. No use of accessory muscles of respiration.  CARDIOVASCULAR: S1, S2 normal. No murmurs, rubs, or gallops.  ABDOMEN: Soft, non-tender, non-distended. Bowel sounds present. No organomegaly or mass.  EXTREMITIES: Bilateral lower extremity edema 1+, no cyanosis, or clubbing.  NEUROLOGIC: Cranial nerves II through XII are intact. Muscle strength 5/5 in all extremities. Sensation intact. Gait not checked.  PSYCHIATRIC: The patient is alert and oriented x 3.  SKIN: No obvious rash, lesion, or ulcer.   DATA REVIEW:   CBC  Recent Labs Lab 07/01/15 0509  WBC 5.5  HGB 11.0*  HCT 34.3*  PLT 155    Chemistries   Recent Labs Lab 07/01/15 0509 07/02/15 0510 07/03/15 0500  NA 137 138 139  K 3.8 4.3 3.7  CL 90* 89* 91*  CO2 39* 40* 42*  GLUCOSE 253* 231* 221*  BUN 12 11 13   CREATININE 1.15 1.23 1.12  CALCIUM 8.7* 9.0 8.4*  MG  --  1.9  --   AST 18  --   --   ALT 8*  --   --   ALKPHOS 80  --   --   BILITOT 0.2*  --   --     Cardiac Enzymes  Recent Labs Lab 07/01/15 0145  TROPONINI <0.03    Microbiology Results  Results for orders placed or performed during the hospital encounter of 05/06/15  MRSA PCR Screening     Status: None   Collection Time: 05/06/15  7:29 PM  Result Value Ref Range Status   MRSA by PCR NEGATIVE NEGATIVE Final    Comment:        The GeneXpert MRSA Assay (FDA approved for NASAL specimens only), is one component of a comprehensive MRSA colonization surveillance program. It is not intended to diagnose MRSA infection nor to guide or monitor treatment for MRSA infections.     RADIOLOGY:  No results found.     I discussed with the Dr. Fletcher Anon, cardiologist Management plans discussed with the patient, family and they are in agreement.  CODE STATUS:   TOTAL TIME TAKING CARE OF THIS PATIENT: 37 minutes.    Demetrios Loll M.D on 07/03/2015 at 4:59 PM  Between 7am to  6pm - Pager - [phone]  After 6pm go to [email or web address] - password [email or web address] [facility]  Office  [phone]  CC: Primary care physician; Kennon Portela, MD

## 2015-07-03 NOTE — Discharge Instructions (Signed)
[facility] appointment on July 16, 2015 at 9:00am with Darylene Price, [place]. Please call [phone] to reschedule.   Low sodium, low fat and ADA diet. Activity as tolerated. Continue O2 [place] 4L. HHPT.

## 2015-07-03 NOTE — Care Management (Signed)
Patient is for discharge home today and verbalizes understanding of need for his home 02 for transport home.  [facility] has been notified and patient will be seen 9/17.  Requested some short term daily visits.  Informed this could be provided

## 2015-07-03 NOTE — Telephone Encounter (Signed)
-----   Message from Arie Sabina sent at 07/03/2015  8:53 AM EDT ----- Regarding: tcm/ph R. Dunn  07/07/15 @ 2:00

## 2015-07-03 NOTE — Progress Notes (Signed)
Inpatient Diabetes Program Recommendations  AACE/ADA: New Consensus Statement on Inpatient Glycemic Control (2015)  Target Ranges:  Prepandial:   less than 140 mg/dL      Peak postprandial:   less than 180 mg/dL (1-2 hours)      Critically ill patients:  140 - 180 mg/dL   Results for [name], [name] (MRN [id number]) as of 07/03/2015 10:27  Ref. Range 07/02/2015 07:39 07/02/2015 11:36 07/02/2015 16:08 07/02/2015 19:23 07/03/2015 07:25  Glucose-Capillary Latest Ref Range: 65-99 mg/dL 225 (H) 212 (H) 244 (H) 158 (H) 181 (H)   Review of Glycemic Control  Diabetes history: DM2 Outpatient Diabetes medications: Lantus 17 units BID, Novolog 10 units TID with meals Current orders for Inpatient glycemic control: Lantus 21 units BID, Novolog 10 units TID with meals, Novolog 0-20 units TID with meals, Novolog 0-5 units HS  Inpatient Diabetes Program Recommendations: Insulin - Meal Coverage: Post prandial glucose continues to be elevated despite receiving Novolog 10 units with meals. Patient received a total of Novolog 51 units on 07/02/15 (30 units for meal coverage and 21 units for correction). Please consider increasing Novolog meal coverage to Novolog 15 units TID with meals.  Thanks, Barnie Alderman, RN, MSN, CCRN, CDE Diabetes Coordinator Inpatient Diabetes Program [phone] (Team Pager from [place] to [place]) [phone] ([facility] office) [phone] [facility] office) [phone] [facility] office)

## 2015-07-03 NOTE — Progress Notes (Signed)
Discharge instructions along with home medication list and follow up gone over with patient. Printed rx given to patient. Patient verbalized that he understood instruction. Telemetry removed. Patient placed in supine position. Dressing to right upper arm picc removed. Sterile technique used, picc removed cath cut at 52, vasoline gauze 2x2 gauze and tegaderm applied to site. Pressure held for 13mins. No bleeding noted. Patient instructed to lay supine for 30 mins. [place]

## 2015-07-03 NOTE — Telephone Encounter (Signed)
Patient contacted regarding discharge from [facility] on 07/03/15.  Patient understands to follow up with Christell Faith, PA on 07/07/15 at 2:00 at [facility]. Patient understands discharge instructions? yes Patient understands medications and regiment? yes Patient understands to bring all medications to this visit? yes

## 2015-07-07 ENCOUNTER — Encounter: Payer: Self-pay | Admitting: Physician Assistant

## 2015-07-07 ENCOUNTER — Encounter: Payer: Self-pay | Admitting: *Deleted

## 2015-07-07 ENCOUNTER — Other Ambulatory Visit: Payer: Self-pay | Admitting: *Deleted

## 2015-07-07 NOTE — Patient Outreach (Signed)
Attempt made to contact pt as part of transition of care, discharged 9/16.    Pt's phone kept ringing, unable to leave a voice message.  Will try again later today.     Zara Chess.   [facility]  [phone]

## 2015-07-07 NOTE — Patient Outreach (Signed)
Transition of care call (week 1, discharged 9/16).  Spoke with pt who states was in the hospital because legs/calves were swelling, still has some swelling,legs hurt.  Pt states was discharged with no pain,sleeping medication.  Pt states he called MD, was able to get medication.  Pt states he has a f/u appointment with Dr. Ouida Sills, written down, does not know the date.  Pt states he is working with a Dr. Aris Lot who will come out into the home/treat him.   RN CM discussed with pt insurance coverage if see two different Primary Care doctors to which pt reports he is going to call [employer] and find out.  Pt inquired if Dr. Aris Lot is part of [facility] to which RN CM said she would find out.    Pt states he did not want to talk to RN CM any more until he f/u with [employer].    As discussed, pt agreed to have RN CM f/u again tomorrow.     Zara Chess.   [place] [facility]  [phone]

## 2015-07-09 ENCOUNTER — Other Ambulatory Visit: Payer: Self-pay | Admitting: *Deleted

## 2015-07-09 NOTE — Patient Outreach (Signed)
Attempt made to contact pt, complete Transition of care call from 9/20.  Pt's phone kept ringing, unable to leave voice message.   Will try again.   Note:  This RN CM did research on Dr. Aris Lot (pt spelled out name on 9/20 phone call) found not a [facility] provider, to relay this to pt.        [name].   [place] [facility]  [phone]

## 2015-07-10 ENCOUNTER — Other Ambulatory Visit: Payer: Self-pay | Admitting: *Deleted

## 2015-07-10 NOTE — Patient Outreach (Signed)
Second attempt made to contact pt, complete transition of care call started 9/20.  Again phone kept ringing, unable to leave a voice message.  To try again.      Zara Chess.   [place] [facility]  [phone]

## 2015-07-14 ENCOUNTER — Other Ambulatory Visit: Payer: Self-pay | Admitting: *Deleted

## 2015-07-14 NOTE — Patient Outreach (Signed)
Follow up phone call:  Called pt back as requested, found Dr. Alvester Chou NP is part of [facility], looked again on [facility] provider list- Dr. Aris Lot not on the list.   Informed pt he is the one to choose which doctor he sees, but will have to discharge him from [facility] (RN CM services) if do not f/u with a [facility] provider.   Pt states he wants to talk to his spouse about this decision of whether to see Dr. Ouida Sills or Dr. Aris Lot, requested RN CM to call him back again in 3-4 days.      RN CM to call pt back 9/30 as requested.    Zara Chess.   [place] [facility]  [phone]

## 2015-07-14 NOTE — Patient Outreach (Addendum)
Transition of care call (week 2):  Pt states fell 3-4 days ago, torn ligament in right knee/ankle/hip/ankle.  Pt states did not f/u with MD, taking Hydrocodone/applying Bengay lotion- helping.  Pt states still hurting, nurse [name] came yesterday, assessed area, to come again tomorrow.  Pt states spouse does not check his blood sugar, give insulin any more, not being bathed.  Pt states spouse is still filling pill box, getting his medications.  Pt states did not f/u with Dr. Ouida Sills since hospital discharge, wants to use Dr. Aris Lot- will come to the home.   RN CM discussed with pt research showed Dr. Aris Lot is not a [facility] provider.  Pt states nurse [name] put him in touch with Dr. Aris Lot, Dr. Aris Lot takes  [facility]- requested RN CM call Dr. Aris Lot, shares the same building as [place] and then him back.      [name].   [place] [facility] Management  [phone]

## 2015-07-15 ENCOUNTER — Telehealth: Payer: Self-pay | Admitting: Family

## 2015-07-15 NOTE — Telephone Encounter (Signed)
Returned message left regarding patient's appointment at the [facility] on 07/16/15. Patient wanted to cancel as he felt like he had all the information he needed and he did not want to reschedule at this time. He did request that information be mailed to him in case he changed his mind.

## 2015-07-16 ENCOUNTER — Ambulatory Visit: Payer: Self-pay | Admitting: Family

## 2015-07-24 ENCOUNTER — Encounter: Payer: Self-pay | Admitting: *Deleted

## 2015-07-24 ENCOUNTER — Other Ambulatory Visit: Payer: Self-pay | Admitting: *Deleted

## 2015-07-24 NOTE — Patient Outreach (Signed)
[facility]) Care Management  07/24/2015  [name] [birth year] [id number]  Ongoing Transition of care follow up Covering for [place] attempted outreach call today however unsuccessful. RN able to leave a HIPAA approved voice message requesting a call back to number provided via [facility] case manager. WIll update Kathie Rhodes accordingly concerning today's attempt.  Raina Mina, RN Care Management Coordinator [facility] Main Office [phone]

## 2015-07-24 NOTE — Progress Notes (Signed)
This encounter was created in error - please disregard.

## 2015-07-27 ENCOUNTER — Inpatient Hospital Stay
Admission: EM | Admit: 2015-07-27 | Discharge: 2015-07-29 | DRG: 378 | Disposition: A | Discharge status: Home / Self Care | Payer: Medicare HMO | Attending: Specialist | Admitting: Specialist

## 2015-07-27 DIAGNOSIS — K317 Polyp of stomach and duodenum: Secondary | ICD-10-CM | POA: Diagnosis present

## 2015-07-27 DIAGNOSIS — Z794 Long term (current) use of insulin: Secondary | ICD-10-CM

## 2015-07-27 DIAGNOSIS — Z825 Family history of asthma and other chronic lower respiratory diseases: Secondary | ICD-10-CM

## 2015-07-27 DIAGNOSIS — Z888 Allergy status to other drugs, medicaments and biological substances status: Secondary | ICD-10-CM

## 2015-07-27 DIAGNOSIS — Z833 Family history of diabetes mellitus: Secondary | ICD-10-CM | POA: Diagnosis not present

## 2015-07-27 DIAGNOSIS — I252 Old myocardial infarction: Secondary | ICD-10-CM

## 2015-07-27 DIAGNOSIS — D124 Benign neoplasm of descending colon: Secondary | ICD-10-CM | POA: Diagnosis present

## 2015-07-27 DIAGNOSIS — Z9981 Dependence on supplemental oxygen: Secondary | ICD-10-CM

## 2015-07-27 DIAGNOSIS — Z79899 Other long term (current) drug therapy: Secondary | ICD-10-CM | POA: Diagnosis not present

## 2015-07-27 DIAGNOSIS — Z87891 Personal history of nicotine dependence: Secondary | ICD-10-CM | POA: Diagnosis not present

## 2015-07-27 DIAGNOSIS — J449 Chronic obstructive pulmonary disease, unspecified: Secondary | ICD-10-CM | POA: Diagnosis present

## 2015-07-27 DIAGNOSIS — K922 Gastrointestinal hemorrhage, unspecified: Secondary | ICD-10-CM | POA: Diagnosis present

## 2015-07-27 DIAGNOSIS — K5750 Diverticulosis of both small and large intestine without perforation or abscess without bleeding: Secondary | ICD-10-CM | POA: Diagnosis present

## 2015-07-27 DIAGNOSIS — Z8249 Family history of ischemic heart disease and other diseases of the circulatory system: Secondary | ICD-10-CM | POA: Diagnosis not present

## 2015-07-27 DIAGNOSIS — E785 Hyperlipidemia, unspecified: Secondary | ICD-10-CM | POA: Diagnosis present

## 2015-07-27 DIAGNOSIS — Z9049 Acquired absence of other specified parts of digestive tract: Secondary | ICD-10-CM

## 2015-07-27 DIAGNOSIS — K219 Gastro-esophageal reflux disease without esophagitis: Secondary | ICD-10-CM | POA: Diagnosis present

## 2015-07-27 DIAGNOSIS — Q2733 Arteriovenous malformation of digestive system vessel: Secondary | ICD-10-CM

## 2015-07-27 DIAGNOSIS — Z8371 Family history of colonic polyps: Secondary | ICD-10-CM

## 2015-07-27 DIAGNOSIS — M1990 Unspecified osteoarthritis, unspecified site: Secondary | ICD-10-CM | POA: Diagnosis present

## 2015-07-27 DIAGNOSIS — I5032 Chronic diastolic (congestive) heart failure: Secondary | ICD-10-CM | POA: Diagnosis present

## 2015-07-27 DIAGNOSIS — D175 Benign lipomatous neoplasm of intra-abdominal organs: Secondary | ICD-10-CM | POA: Diagnosis present

## 2015-07-27 DIAGNOSIS — Z8679 Personal history of other diseases of the circulatory system: Secondary | ICD-10-CM | POA: Diagnosis not present

## 2015-07-27 DIAGNOSIS — D62 Acute posthemorrhagic anemia: Secondary | ICD-10-CM | POA: Diagnosis present

## 2015-07-27 DIAGNOSIS — K449 Diaphragmatic hernia without obstruction or gangrene: Secondary | ICD-10-CM | POA: Diagnosis present

## 2015-07-27 DIAGNOSIS — E876 Hypokalemia: Secondary | ICD-10-CM | POA: Diagnosis present

## 2015-07-27 DIAGNOSIS — I110 Hypertensive heart disease with heart failure: Secondary | ICD-10-CM | POA: Diagnosis present

## 2015-07-27 DIAGNOSIS — D500 Iron deficiency anemia secondary to blood loss (chronic): Secondary | ICD-10-CM | POA: Diagnosis present

## 2015-07-27 DIAGNOSIS — Z880 Allergy status to penicillin: Secondary | ICD-10-CM

## 2015-07-27 DIAGNOSIS — K921 Melena: Principal | ICD-10-CM | POA: Diagnosis present

## 2015-07-27 DIAGNOSIS — E119 Type 2 diabetes mellitus without complications: Secondary | ICD-10-CM | POA: Diagnosis present

## 2015-07-27 DIAGNOSIS — Z951 Presence of aortocoronary bypass graft: Secondary | ICD-10-CM

## 2015-07-27 DIAGNOSIS — D120 Benign neoplasm of cecum: Secondary | ICD-10-CM | POA: Diagnosis present

## 2015-07-27 DIAGNOSIS — I2510 Atherosclerotic heart disease of native coronary artery without angina pectoris: Secondary | ICD-10-CM | POA: Diagnosis present

## 2015-07-27 LAB — CBC
HEMATOCRIT: 24.9 % — AB (ref 40.0–52.0)
HEMOGLOBIN: 8.4 g/dL — AB (ref 13.0–18.0)
MCH: 30.5 pg (ref 26.0–34.0)
MCHC: 33.5 g/dL (ref 32.0–36.0)
MCV: 91.1 fL (ref 80.0–100.0)
Platelets: 184 10*3/uL (ref 150–440)
RBC: 2.74 MIL/uL — ABNORMAL LOW (ref 4.40–5.90)
RDW: 16 % — ABNORMAL HIGH (ref 11.5–14.5)
WBC: 9.2 10*3/uL (ref 3.8–10.6)

## 2015-07-27 LAB — COMPREHENSIVE METABOLIC PANEL
ALK PHOS: 82 U/L (ref 38–126)
ALT: 8 U/L — ABNORMAL LOW (ref 17–63)
ANION GAP: 10 (ref 5–15)
AST: 16 U/L (ref 15–41)
Albumin: 3.1 g/dL — ABNORMAL LOW (ref 3.5–5.0)
BILIRUBIN TOTAL: 0.2 mg/dL — AB (ref 0.3–1.2)
BUN: 29 mg/dL — ABNORMAL HIGH (ref 6–20)
CALCIUM: 8.4 mg/dL — AB (ref 8.9–10.3)
CO2: 34 mmol/L — ABNORMAL HIGH (ref 22–32)
Chloride: 94 mmol/L — ABNORMAL LOW (ref 101–111)
Creatinine, Ser: 1.18 mg/dL (ref 0.61–1.24)
GFR, EST NON AFRICAN AMERICAN: 59 mL/min — AB (ref >60)
Glucose, Bld: 213 mg/dL — ABNORMAL HIGH (ref 65–99)
Potassium: 3 mmol/L — ABNORMAL LOW (ref 3.5–5.1)
SODIUM: 138 mmol/L (ref 135–145)
TOTAL PROTEIN: 6 g/dL — AB (ref 6.5–8.1)

## 2015-07-27 LAB — TYPE AND SCREEN
ABO/RH(D): B NEG
ANTIBODY SCREEN: NEGATIVE

## 2015-07-27 LAB — TROPONIN I

## 2015-07-27 MED ORDER — ACETAMINOPHEN 325 MG PO TABS: 650.0000 mg | ORAL_TABLET | Freq: Four times a day (QID) | ORAL | Status: DC | PRN

## 2015-07-27 MED ORDER — ONDANSETRON HCL 4 MG/2ML IJ SOLN: 4.0000 mg | Freq: Four times a day (QID) | INTRAMUSCULAR | Status: DC | PRN

## 2015-07-27 MED ORDER — PANTOPRAZOLE SODIUM 40 MG IV SOLR
40.0000 mg | Freq: Two times a day (BID) | INTRAVENOUS | Status: DC
  Administered 2015-07-28 (×2): 40 mg via INTRAVENOUS
  Filled 2015-07-27 (×2): qty 40

## 2015-07-27 MED ORDER — PANTOPRAZOLE SODIUM 40 MG PO TBEC: 40.0000 mg | DELAYED_RELEASE_TABLET | Freq: Two times a day (BID) | ORAL | Status: DC

## 2015-07-27 MED ORDER — INSULIN GLARGINE 100 UNIT/ML ~~LOC~~ SOLN
10.0000 [IU] | Freq: Two times a day (BID) | SUBCUTANEOUS | Status: DC
  Administered 2015-07-28 (×2): 10 [IU] via SUBCUTANEOUS
  Filled 2015-07-27 (×5): qty 0.1

## 2015-07-27 MED ORDER — SODIUM CHLORIDE 0.9 % IV BOLUS (SEPSIS)
500.0000 mL | Freq: Once | INTRAVENOUS | Status: AC
  Administered 2015-07-27: 500 mL via INTRAVENOUS

## 2015-07-27 MED ORDER — ALPRAZOLAM 0.5 MG PO TABS
0.5000 mg | ORAL_TABLET | Freq: Three times a day (TID) | ORAL | Status: DC | PRN
  Administered 2015-07-28 – 2015-07-29 (×4): 0.5 mg via ORAL
  Filled 2015-07-27 (×4): qty 1

## 2015-07-27 MED ORDER — PANTOPRAZOLE SODIUM 40 MG IV SOLR
40.0000 mg | Freq: Once | INTRAVENOUS | Status: AC
  Administered 2015-07-27: 40 mg via INTRAVENOUS
  Filled 2015-07-27: qty 40

## 2015-07-27 MED ORDER — MORPHINE SULFATE (PF) 2 MG/ML IV SOLN: 2.0000 mg | INTRAVENOUS | Status: DC | PRN

## 2015-07-27 MED ORDER — CITALOPRAM HYDROBROMIDE 20 MG PO TABS
40.0000 mg | ORAL_TABLET | Freq: Every day | ORAL | Status: DC
  Administered 2015-07-28: 40 mg via ORAL
  Filled 2015-07-27 (×2): qty 2

## 2015-07-27 MED ORDER — SODIUM CHLORIDE 0.9 % IV SOLN
INTRAVENOUS | Status: DC
  Administered 2015-07-27: via INTRAVENOUS

## 2015-07-27 MED ORDER — VITAMIN D (ERGOCALCIFEROL) 1.25 MG (50000 UNIT) PO CAPS: 50000.0000 [IU] | ORAL_CAPSULE | ORAL | Status: DC

## 2015-07-27 MED ORDER — ATORVASTATIN CALCIUM 20 MG PO TABS
40.0000 mg | ORAL_TABLET | Freq: Every day | ORAL | Status: DC
  Administered 2015-07-28: 40 mg via ORAL
  Filled 2015-07-27: qty 2

## 2015-07-27 MED ORDER — INSULIN ASPART 100 UNIT/ML ~~LOC~~ SOLN: 0.0000 [IU] | Freq: Every day | SUBCUTANEOUS | Status: DC

## 2015-07-27 MED ORDER — FERROUS SULFATE 325 (65 FE) MG PO TABS
650.0000 mg | ORAL_TABLET | Freq: Three times a day (TID) | ORAL | Status: DC
  Administered 2015-07-28 – 2015-07-29 (×4): 650 mg via ORAL
  Filled 2015-07-27 (×5): qty 2

## 2015-07-27 MED ORDER — ACETAMINOPHEN 650 MG RE SUPP: 650.0000 mg | Freq: Four times a day (QID) | RECTAL | Status: DC | PRN

## 2015-07-27 MED ORDER — ALBUTEROL SULFATE (2.5 MG/3ML) 0.083% IN NEBU: 3.0000 mL | INHALATION_SOLUTION | Freq: Four times a day (QID) | RESPIRATORY_TRACT | Status: DC | PRN

## 2015-07-27 MED ORDER — SUCRALFATE 1 G PO TABS
1.0000 g | ORAL_TABLET | Freq: Three times a day (TID) | ORAL | Status: DC
  Administered 2015-07-28 – 2015-07-29 (×5): 1 g via ORAL
  Filled 2015-07-27 (×5): qty 1

## 2015-07-27 MED ORDER — INSULIN ASPART 100 UNIT/ML ~~LOC~~ SOLN
0.0000 [IU] | Freq: Three times a day (TID) | SUBCUTANEOUS | Status: DC
  Administered 2015-07-28: 3 [IU] via SUBCUTANEOUS
  Administered 2015-07-28: 2 [IU] via SUBCUTANEOUS
  Administered 2015-07-28: 1 [IU] via SUBCUTANEOUS
  Filled 2015-07-27: qty 3
  Filled 2015-07-27: qty 2
  Filled 2015-07-27: qty 1

## 2015-07-27 MED ORDER — LISINOPRIL 5 MG PO TABS
5.0000 mg | ORAL_TABLET | Freq: Every day | ORAL | Status: DC
  Administered 2015-07-28: 5 mg via ORAL
  Filled 2015-07-27: qty 1

## 2015-07-27 MED ORDER — ONDANSETRON HCL 4 MG PO TABS: 4.0000 mg | ORAL_TABLET | Freq: Four times a day (QID) | ORAL | Status: DC | PRN

## 2015-07-27 MED ORDER — ZOLPIDEM TARTRATE 5 MG PO TABS
5.0000 mg | ORAL_TABLET | Freq: Every evening | ORAL | Status: DC | PRN
  Administered 2015-07-28 (×2): 5 mg via ORAL
  Filled 2015-07-27 (×2): qty 1

## 2015-07-27 MED ORDER — METOPROLOL TARTRATE 25 MG PO TABS
25.0000 mg | ORAL_TABLET | Freq: Two times a day (BID) | ORAL | Status: DC
  Administered 2015-07-28 (×2): 25 mg via ORAL
  Filled 2015-07-27 (×2): qty 1

## 2015-07-27 MED ORDER — BUDESONIDE-FORMOTEROL FUMARATE 160-4.5 MCG/ACT IN AERO
2.0000 | INHALATION_SPRAY | Freq: Two times a day (BID) | RESPIRATORY_TRACT | Status: DC
  Administered 2015-07-28 (×2): 2 via RESPIRATORY_TRACT
  Filled 2015-07-27: qty 6

## 2015-07-27 MED ORDER — DIVALPROEX SODIUM 500 MG PO DR TAB
500.0000 mg | DELAYED_RELEASE_TABLET | Freq: Every day | ORAL | Status: DC
  Administered 2015-07-28: 500 mg via ORAL
  Filled 2015-07-27: qty 1

## 2015-07-27 MED ORDER — TIOTROPIUM BROMIDE MONOHYDRATE 18 MCG IN CAPS
18.0000 ug | ORAL_CAPSULE | Freq: Every day | RESPIRATORY_TRACT | Status: DC
  Administered 2015-07-28: 18 ug via RESPIRATORY_TRACT
  Filled 2015-07-27: qty 5

## 2015-07-27 MED ORDER — DOCUSATE SODIUM 100 MG PO CAPS
100.0000 mg | ORAL_CAPSULE | Freq: Two times a day (BID) | ORAL | Status: DC
  Administered 2015-07-28 (×2): 100 mg via ORAL
  Filled 2015-07-27 (×2): qty 1
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
## 2015-07-27 NOTE — ED Notes (Signed)
Pt brought to ED by EMS w/ c/o gi bleeding.  Pt had 2 witnessed BM at home.  EMS sts that BMs were dark and large.  Pt has hx of gi bleeds.  Pt pale.
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
## 2015-07-27 NOTE — Progress Notes (Signed)
   07/27/15 2100  Clinical Encounter Type  Visited With Patient  Visit Type Initial  Referral From Nurse  Spiritual Encounters  Spiritual Needs Prayer;Emotional  Stress Factors  Patient Stress Factors Family relationships;Health changes  Family Stress Factors Health changes  Chaplain Marcello Moores engaged patient. Patient appeared anxious and wordy in speech. Chaplain offered support and care. Patient appeared to be experience grief over loss of animals in family unit. Patient expressed challenges with wife. Chaplain will follow up.

## 2015-07-28 LAB — CBC
HCT: 22.6 % — ABNORMAL LOW (ref 40.0–52.0)
HCT: 23.8 % — ABNORMAL LOW (ref 40.0–52.0)
HEMATOCRIT: 22.9 % — AB (ref 40.0–52.0)
HEMATOCRIT: 22.6 % — AB (ref 40.0–52.0)
HEMOGLOBIN: 7.6 g/dL — AB (ref 13.0–18.0)
HEMOGLOBIN: 7.3 g/dL — AB (ref 13.0–18.0)
Hemoglobin: 7.4 g/dL — ABNORMAL LOW (ref 13.0–18.0)
Hemoglobin: 7.7 g/dL — ABNORMAL LOW (ref 13.0–18.0)
MCH: 29.3 pg (ref 26.0–34.0)
MCH: 29.5 pg (ref 26.0–34.0)
MCH: 30 pg (ref 26.0–34.0)
MCH: 30.1 pg (ref 26.0–34.0)
MCHC: 32.3 g/dL (ref 32.0–36.0)
MCHC: 32.5 g/dL (ref 32.0–36.0)
MCHC: 32.7 g/dL (ref 32.0–36.0)
MCHC: 33.3 g/dL (ref 32.0–36.0)
MCV: 90.2 fL (ref 80.0–100.0)
MCV: 90.7 fL (ref 80.0–100.0)
MCV: 90.7 fL (ref 80.0–100.0)
MCV: 92 fL (ref 80.0–100.0)
PLATELETS: 169 10*3/uL (ref 150–440)
Platelets: 166 10*3/uL (ref 150–440)
Platelets: 167 10*3/uL (ref 150–440)
Platelets: 146 10*3/uL — ABNORMAL LOW (ref 150–440)
RBC: 2.46 MIL/uL — ABNORMAL LOW (ref 4.40–5.90)
RBC: 2.5 MIL/uL — AB (ref 4.40–5.90)
RBC: 2.54 MIL/uL — ABNORMAL LOW (ref 4.40–5.90)
RBC: 2.62 MIL/uL — AB (ref 4.40–5.90)
RDW: 15.5 % — ABNORMAL HIGH (ref 11.5–14.5)
RDW: 15.8 % — ABNORMAL HIGH (ref 11.5–14.5)
RDW: 16.2 % — ABNORMAL HIGH (ref 11.5–14.5)
RDW: 16.3 % — AB (ref 11.5–14.5)
WBC: 5.3 10*3/uL (ref 3.8–10.6)
WBC: 5.6 10*3/uL (ref 3.8–10.6)
WBC: 6.1 10*3/uL (ref 3.8–10.6)
WBC: 7.6 10*3/uL (ref 3.8–10.6)

## 2015-07-28 LAB — GLUCOSE, CAPILLARY
GLUCOSE-CAPILLARY: 202 mg/dL — AB (ref 65–99)
GLUCOSE-CAPILLARY: 186 mg/dL — AB (ref 65–99)
Glucose-Capillary: 149 mg/dL — ABNORMAL HIGH (ref 65–99)
Glucose-Capillary: 158 mg/dL — ABNORMAL HIGH (ref 65–99)

## 2015-07-28 LAB — BASIC METABOLIC PANEL
Anion gap: 7 (ref 5–15)
BUN: 27 mg/dL — ABNORMAL HIGH (ref 6–20)
CALCIUM: 8.1 mg/dL — AB (ref 8.9–10.3)
CO2: 35 mmol/L — AB (ref 22–32)
CREATININE: 1.21 mg/dL (ref 0.61–1.24)
Chloride: 97 mmol/L — ABNORMAL LOW (ref 101–111)
GFR calc Af Amer: >60 mL/min (ref >60)
GFR calc non Af Amer: 58 mL/min — ABNORMAL LOW (ref >60)
GLUCOSE: 172 mg/dL — AB (ref 65–99)
Potassium: 3 mmol/L — ABNORMAL LOW (ref 3.5–5.1)
Sodium: 139 mmol/L (ref 135–145)

## 2015-07-28 LAB — MAGNESIUM: MAGNESIUM: 1.3 mg/dL — AB (ref 1.7–2.4)

## 2015-07-28 MED ORDER — HYDROCODONE-ACETAMINOPHEN 10-325 MG PO TABS
1.0000 | ORAL_TABLET | ORAL | Status: DC | PRN
  Administered 2015-07-28 (×4): 1 via ORAL
  Filled 2015-07-28 (×4): qty 1

## 2015-07-28 MED ORDER — SODIUM CHLORIDE 0.9 % IV SOLN
INTRAVENOUS | Status: DC
  Administered 2015-07-28: 16:00:00 via INTRAVENOUS
  Administered 2015-07-29: 1000 mL via INTRAVENOUS

## 2015-07-28 MED ORDER — PANTOPRAZOLE SODIUM 40 MG PO TBEC: 40.0000 mg | DELAYED_RELEASE_TABLET | Freq: Two times a day (BID) | ORAL | Status: DC

## 2015-07-28 MED ORDER — POTASSIUM CHLORIDE CRYS ER 20 MEQ PO TBCR
20.0000 meq | EXTENDED_RELEASE_TABLET | Freq: Two times a day (BID) | ORAL | Status: DC
  Administered 2015-07-28 (×2): 20 meq via ORAL
  Filled 2015-07-28 (×2): qty 1

## 2015-07-28 MED ORDER — MAGNESIUM SULFATE 2 GM/50ML IV SOLN
2.0000 g | Freq: Once | INTRAVENOUS | Status: AC
  Administered 2015-07-28: 2 g via INTRAVENOUS
  Filled 2015-07-28: qty 50

## 2015-07-28 NOTE — Consult Note (Signed)
Pt has COPD on 3-4 L [place] at home.  He has recurrent microcytic anemia and has dropped 2 units so far this admission.  He took asprin for musculoskeletal problems and had onset of black tarry stools. Hgb went from 9.8 to 7.6.  Discussed pro and con about doing an EGD and he is in favor of this.  Will do EGD tomorrow or have Dr [name] do it. He feels his lungs are in his usual shape and agrees with procedure.  See NP/PA note.
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
## 2015-07-28 NOTE — Progress Notes (Signed)
[name] NAME: [name]    MR#:  [id number]  DATE OF BIRTH:  17-Sep-1941  SUBJECTIVE:  CHIEF COMPLAINT:   Chief Complaint  Patient presents with  . GI Bleeding   Patient here with melanotic stools. He is on chronic iron supplements. Hemoglobin stable. No other acute complaints presently.  REVIEW OF SYSTEMS:    Review of Systems  Constitutional: Negative for fever and chills.  HENT: Negative for congestion and tinnitus.   Eyes: Negative for blurred vision and double vision.  Respiratory: Negative for cough, shortness of breath and wheezing.   Cardiovascular: Negative for chest pain, orthopnea and PND.  Gastrointestinal: Negative for nausea, vomiting, abdominal pain and diarrhea.  Genitourinary: Negative for dysuria and hematuria.  Neurological: Negative for dizziness, sensory change and focal weakness.  All other systems reviewed and are negative.   Nutrition: Clear liquid Tolerating Diet: Yes Tolerating PT: Ambulatory    DRUG ALLERGIES:   Allergies  Allergen Reactions  . Penicillins Anaphylaxis, Hives and Other (See Comments)    Pt is unable to answer additional questions about this medication.    . Demerol [Meperidine] Other (See Comments)    Reaction:  Hallucinations    . Dilaudid [Hydromorphone Hcl] Other (See Comments)    Reaction:  Hallucinations   . Morphine And Related Nausea Only and Other (See Comments)    Hallucinations, rash, shortness of breath    VITALS:  Blood pressure 124/69, pulse 114, temperature 97.4 F (36.3 C), temperature source Oral, resp. rate 20, height 6\' 2"  (1.88 m), weight 123.378 kg (272 lb), SpO2 100 %.  PHYSICAL EXAMINATION:   Physical Exam  GENERAL:  74 y.o.-year-old obese patient lying in the bed with no acute distress.  EYES: Pupils equal, round, reactive to light and accommodation. No scleral icterus. Extraocular muscles intact.  HEENT: Head atraumatic,  normocephalic. Oropharynx and nasopharynx clear.  NECK:  Supple, no jugular venous distention. No thyroid enlargement, no tenderness.  LUNGS: Normal breath sounds bilaterally, no wheezing, rales, rhonchi. No use of accessory muscles of respiration.  CARDIOVASCULAR: S1, S2 normal. No murmurs, rubs, or gallops.  ABDOMEN: Soft, nontender, nondistended. Bowel sounds present. No organomegaly or mass.  EXTREMITIES: No cyanosis, clubbing or edema b/l.    NEUROLOGIC: Cranial nerves II through XII are intact. No focal Motor or sensory deficits b/l.   PSYCHIATRIC: The patient is alert and oriented x 3. Good affect SKIN: No obvious rash, lesion, or ulcer.    LABORATORY PANEL:   CBC  Recent Labs Lab 07/28/15 0528  WBC 6.1  HGB 7.6*  HCT 22.9*  PLT 166   ------------------------------------------------------------------------------------------------------------------  Chemistries   Recent Labs Lab 07/27/15 1923 07/28/15 0012  NA 138 139  K 3.0* 3.0*  CL 94* 97*  CO2 34* 35*  GLUCOSE 213* 172*  BUN 29* 27*  CREATININE 1.18 1.21  CALCIUM 8.4* 8.1*  AST 16  --   ALT 8*  --   ALKPHOS 82  --   BILITOT 0.2*  --    ------------------------------------------------------------------------------------------------------------------  Cardiac Enzymes  Recent Labs Lab 07/27/15 1923  TROPONINI <0.03   ------------------------------------------------------------------------------------------------------------------  RADIOLOGY:  No results found.   ASSESSMENT AND PLAN:   74 year old male with past medical history of diabetes, hypertension, hyperlipidemia, COPD on chronic home O2, anxiety, history of previous GI bleed, history of gastric polyps and AV malformations, coronary disease, obesity, history of abdominal aortic aneurysm, who presented to the hospital due to melanotic stools.  #  1 GI bleed-this is a suspected upper GI bleed given the patient's melanotic stools. Patient says he  took about 4 aspirins to help with some osteoarthritic pain a few days back. He has had no further bleeding while in the hospital. He does take iron supplements which would cause his stool to be dark in color. -Hemoglobin currently stable. No need for acute transfusion -Await gastroenterology input. We'll place the patient on clear liquid diet.  #2 anemia-acute on chronic blood loss anemia. -Hemoglobin currently stable. No need for acute transfusion. We'll follow serial counts.  #3 COPD-no acute exacerbation continue Symbicort, Spiriva. -Patient on oxygen at home.  #4 hypokalemia-we'll start the patient oral potassium supplements. Check a magnesium level.  #5 GERD-continue Protonix.  #6 hypertension-continue lisinopril, metoprolol.  #7 type 2 diabetes without complication-continue Lantus, sliding scale insulin.  #8 anxiety/depression-continue Xanax, Celexa.  #9 history of bipolar disorder-continue Depakote.    All the records are reviewed and case discussed with Care Management/Social Workerr. Management plans discussed with the patient, family and they are in agreement.  CODE STATUS: Full  DVT Prophylaxis: Teds and SCD's.   TOTAL TIME TAKING CARE OF THIS PATIENT: 35 minutes.   POSSIBLE D/C IN 1-2 DAYS, DEPENDING ON CLINICAL CONDITION.  Greater than 50% of time spent in coordination of care discussion with the patient's wife and also nursing staff   Henreitta Leber M.D on 07/28/2015 at 11:38 AM  Between 7am to 6pm - Pager - [phone]  After 6pm go to [email or web address] - password [email or web address] [facility]  Office  [phone]  CC: Primary care physician; Kirk Ruths., MD

## 2015-07-28 NOTE — Care Management (Signed)
Spoke with patient for discharge planning patient is from home with spouse. Patient has rambling speech and stated that he is upset with his spouse because she pays more attention to her horses than she does him. He also stated that he is now unhappy with [facility] and that he is going to tell them "youre fired".  Unhappy that the NP Dr Aris Lot missed her appointment with him on a Sunday. Patient stated that he has someone at home that can come and get him. Patient is frequent admission. Following closely. Have contacted [facility] inform. Patient has had tremendous assistance from multiple agencies and continues to fail outpatient management.
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
## 2015-07-28 NOTE — Progress Notes (Signed)
Event took place at 0830.  Patient was being uncooperative with staff, cursing and threatening to leave AMA because he did not want to have a Air cabin crew. Charge nurse was present to talk to patient as well as Therapist, sports.  During the conversation he remained argumentative.  He has attempted to contact his wife but can not reach her as he states that she tends to her horses until about 10 or 1030. He has been reassigned to a different primary nurse and currently is agreeable to having nurse tech Betty sitting with him, until he can speak with his wife.

## 2015-07-28 NOTE — Progress Notes (Signed)
Spoke with Dr Lavetta Nielsen at [phone]. Patient refused most of medications ordered. Pt refused Accucheck.. Received permission to check glucose level from BMP. Pt refused all insulins.

## 2015-07-28 NOTE — Progress Notes (Signed)
Primary RN introduced self to pt at change of shift.  Pt stated that he only had six months to live. Primary RN asked Pt  that if he felt suicidal or had suicidal thoughts.  Pt  stated that he could not answer that question due to the fact that they send a psych Dr. to see him and put him in the psych ward. Primary RN paged  Dr. Verdell Carmine. While waiting for callback  pt CNA reported to nurse that pt stated that he "just wants to go to sleep and never wake up. Orders received from [facility] for a safety sitter at bedside along with psychologist consult.

## 2015-07-29 ENCOUNTER — Encounter
Admission: EM | Disposition: A | Discharge status: Home / Self Care | Payer: Self-pay | Source: Home / Self Care | Attending: Specialist

## 2015-07-29 ENCOUNTER — Inpatient Hospital Stay: Payer: Medicare HMO | Admitting: Anesthesiology

## 2015-07-29 ENCOUNTER — Encounter: Payer: Self-pay | Admitting: *Deleted

## 2015-07-29 ENCOUNTER — Other Ambulatory Visit: Payer: Self-pay | Admitting: *Deleted

## 2015-07-29 HISTORY — PX: ESOPHAGOGASTRODUODENOSCOPY (EGD) WITH PROPOFOL: SHX5813

## 2015-07-29 LAB — CBC
HEMATOCRIT: 22.5 % — AB (ref 40.0–52.0)
HEMOGLOBIN: 7.3 g/dL — AB (ref 13.0–18.0)
MCH: 29.8 pg (ref 26.0–34.0)
MCHC: 32.6 g/dL (ref 32.0–36.0)
MCV: 91.6 fL (ref 80.0–100.0)
Platelets: 159 10*3/uL (ref 150–440)
RBC: 2.46 MIL/uL — ABNORMAL LOW (ref 4.40–5.90)
RDW: 16.1 % — ABNORMAL HIGH (ref 11.5–14.5)
WBC: 5.4 10*3/uL (ref 3.8–10.6)

## 2015-07-29 LAB — HEMOGLOBIN: HEMOGLOBIN: 7.5 g/dL — AB (ref 13.0–18.0)

## 2015-07-29 LAB — POTASSIUM: Potassium: 3.2 mmol/L — ABNORMAL LOW (ref 3.5–5.1)

## 2015-07-29 LAB — GLUCOSE, CAPILLARY
Glucose-Capillary: 133 mg/dL — ABNORMAL HIGH (ref 65–99)
Glucose-Capillary: 139 mg/dL — ABNORMAL HIGH (ref 65–99)

## 2015-07-29 SURGERY — ESOPHAGOGASTRODUODENOSCOPY (EGD) WITH PROPOFOL
Anesthesia: General

## 2015-07-29 MED ORDER — BUTAMBEN-TETRACAINE-BENZOCAINE 2-2-14 % EX AERO
INHALATION_SPRAY | CUTANEOUS | Status: DC | PRN
  Administered 2015-07-29: 1 via TOPICAL

## 2015-07-29 MED ORDER — PROPOFOL 500 MG/50ML IV EMUL
INTRAVENOUS | Status: DC | PRN
  Administered 2015-07-29: 140 ug/kg/min via INTRAVENOUS

## 2015-07-29 MED ORDER — GLYCOPYRROLATE 0.2 MG/ML IJ SOLN
INTRAMUSCULAR | Status: DC | PRN
  Administered 2015-07-29: 0.2 mg via INTRAVENOUS

## 2015-07-29 MED ORDER — PROPOFOL 10 MG/ML IV BOLUS
INTRAVENOUS | Status: DC | PRN
  Administered 2015-07-29: 50 mg via INTRAVENOUS

## 2015-07-29 MED ORDER — LIDOCAINE HCL (CARDIAC) 20 MG/ML IV SOLN
INTRAVENOUS | Status: DC | PRN
  Administered 2015-07-29: 30 mg via INTRAVENOUS

## 2015-07-29 NOTE — Care Management Important Message (Signed)
Important Message  Patient Details  Name: [name] MRN: [id number] Date of Birth: 1941/09/17   Medicare Important Message Given:  Yes-second notification given    Alvie Heidelberg, RN 07/29/2015, 11:15 AM

## 2015-07-29 NOTE — Transfer of Care (Signed)
Immediate Anesthesia Transfer of Care Note  Patient: [name]  Procedure(s) Performed: Procedure(s): ESOPHAGOGASTRODUODENOSCOPY (EGD) WITH PROPOFOL (N/A)  Patient Location: PACU and Endoscopy Unit  Anesthesia Type:General  Level of Consciousness: sedated  Airway & Oxygen Therapy: Patient Spontanous Breathing and Patient connected to nasal cannula oxygen  Post-op Assessment: Report given to RN and Post -op Vital signs reviewed and stable  Post vital signs: Reviewed and stable  Last Vitals:  Filed Vitals:   07/29/15 0923  BP: 93/57  Pulse:   Temp: 35.7 C  Resp: 15    Complications: No apparent anesthesia complications

## 2015-07-29 NOTE — Care Management (Addendum)
Patient for discharge today per attending. Patient is open to [facility]. Contacted Sharmon Revere at [facility] to inform of pending discharge. Explained patient concerns with [name] and she agrees to address with patient.

## 2015-07-29 NOTE — Anesthesia Preprocedure Evaluation (Signed)
Anesthesia Evaluation  Patient identified by MRN, date of birth, ID band Patient awake    Reviewed: Allergy & Precautions, H&P , NPO status , Patient's Chart, lab work & pertinent test results, reviewed documented beta blocker date and time   Airway Mallampati: II  TM Distance: >3 FB Neck ROM: full    Dental no notable dental hx.    Pulmonary neg pulmonary ROS, COPD, former smoker,    Pulmonary exam normal breath sounds clear to auscultation       Cardiovascular Exercise Tolerance: Good hypertension, + CAD, + Peripheral Vascular Disease and +CHF  negative cardio ROS   Rhythm:regular Rate:Normal     Neuro/Psych PSYCHIATRIC DISORDERS Anxiety Depression Bipolar Disorder negative neurological ROS  negative psych ROS   GI/Hepatic negative GI ROS, Neg liver ROS,   Endo/Other  negative endocrine ROSdiabetes  Renal/GU negative Renal ROS  negative genitourinary   Musculoskeletal   Abdominal   Peds  Hematology negative hematology ROS (+) anemia ,   Anesthesia Other Findings   Reproductive/Obstetrics negative OB ROS                             Anesthesia Physical Anesthesia Plan  ASA: IV  Anesthesia Plan: General   Post-op Pain Management:    Induction:   Airway Management Planned:   Additional Equipment:   Intra-op Plan:   Post-operative Plan:   Informed Consent: I have reviewed the patients History and Physical, chart, labs and discussed the procedure including the risks, benefits and alternatives for the proposed anesthesia with the patient or authorized representative who has indicated his/her understanding and acceptance.   Dental Advisory Given  Plan Discussed with: CRNA  Anesthesia Plan Comments:         Anesthesia Quick Evaluation

## 2015-07-29 NOTE — Op Note (Signed)
[facility] Patient Name: [name] Procedure Date: 07/29/2015 8:57 AM MRN: [id number] Account #: [id number] Date of Birth: [birth year] Admit Type: Inpatient Age: 74 Room: [facility] ENDO ROOM 3 Gender: Male Note Status: Finalized Procedure:         Upper GI endoscopy Indications:       [facility] Providers:         Manya Silvas, MD Referring MD:      Ocie Cornfield. Ouida Sills, MD (Referring MD) Medicines:         Propofol per Anesthesia, Cetacaine spray Complications:     No immediate complications. Procedure:         Pre-Anesthesia Assessment:                    - After reviewing the risks and benefits, the patient was                     deemed in satisfactory condition to undergo the procedure.                    After obtaining informed consent, the endoscope was passed                     under direct vision. Throughout the procedure, the                     patient's blood pressure, pulse, and oxygen saturations                     were monitored continuously. The Endoscope was introduced                     through the mouth, and advanced to the second part of                     duodenum. The upper GI endoscopy was accomplished without                     difficulty. The patient tolerated the procedure well. Findings:      The examined esophagus was normal. GEJ 41cm.      A large hiatus hernia was present.      A few dispersed, small non-bleeding erosions were found in the gastric       body. There were stigmata of recent bleeding. No active bleeding seen.       Multiple small polypoid harmlless bumps seen in body of stomach.      A single medium-sized sessile polyp or enlarged with no bleeding was       found in the duodenal bulb. Biopsies were taken with a cold forceps for       histology. Impression:        - Normal esophagus.                    - Large hiatus hernia.                    - Recently bleeding erosive gastropathy.           - A single duodenal polyp. Biopsied. Recommendation:    - Await pathology results. Continue PPI and start carafate                     slurry. Full liquid diet ok. Manya Silvas, MD 07/29/2015 9:21:40  AM This report has been signed electronically. Number of Addenda: 0 Note Initiated On: 07/29/2015 8:57 AM      [facility]

## 2015-07-29 NOTE — Progress Notes (Signed)
   07/29/15 0930  Aldrete  Activity 2  Respiration 2  Circulation 2  Consciousness 2  Color 2  Aldrete Score 10     07/29/15 0930  Aldrete  Activity 2  Respiration 2  Circulation 2  Consciousness 2  Color 2  Aldrete Score 10

## 2015-07-29 NOTE — Anesthesia Procedure Notes (Signed)
Date/Time: 07/29/2015 8:56 AM Performed by: Doreen Salvage Pre-anesthesia Checklist: Patient identified, Emergency Drugs available, Suction available and Patient being monitored Patient Re-evaluated:Patient Re-evaluated prior to inductionOxygen Delivery Method: Nasal cannula Intubation Type: IV induction Dental Injury: Teeth and Oropharynx as per pre-operative assessment  Comments: Nasal cannula with etCO2 monitoring

## 2015-07-29 NOTE — Clinical Documentation Improvement (Addendum)
Hospitalist Gastroenterology  Based on the clinical findings below, please document any associated diagnoses/conditions the patient has or may have.   Chronic respiratory failure  Other  Clinically Undetermined  Supporting Information: Current documentation reflects patient on 4L oxygen at home.     Please exercise your independent, professional judgment when responding. A specific answer is not anticipated or expected. Please update your documentation within the medical record to reflect your response to this query.   Thank you, Mateo Flow, RN [phone] Clinical Documentation Specialist

## 2015-07-29 NOTE — Patient Outreach (Signed)
[facility]) Care Management  07/29/2015  DARY DILAURO [birth year] [id number] Covering Kathie Rhodes, RNCM  RN spoke with pt's spouse today and introduced [facility]. Wife indicated pt went on yesterday to have a procedure done and pt was suppose to come home however wife indicates pt was too weak and additional test were completed indicating "some polys were taken off and pt has an ulcer". Wife states some leaking of blood but "not much". States it was decided to keep the pt overnight (hospitlized) so pt was not discharged from [facility].  Note EPIC noted indications ED Admission [facility]ena and lower GI bleed(10/10)  and pt scheduled today (10/12) Esophagogastroduodenoscopy. Wife receptive to ongoing follow ups. Again pt currently inpt status for observation and further testing states pt's spouse.   Will report to to [facility]  for ongoing Transition of care needs.   Raina Mina, RN Care Management Coordinator [facility] Main Office [phone]

## 2015-07-29 NOTE — Progress Notes (Addendum)
Patient discharged to home as ordered.Patient is awaiting his family to bring tank from home and patient instructed to follow up with Dr. Ouida Sills and patient will make appointment for one week. IV discontinued site without S/S of infiltration or infection Patient is alert and oriented no acute distress noted.Patient alert and oriented VSS, ambulates without assistance. Dr. Verdell Carmine spoke to patient and stated that patient is well enough to go home today and there is no further care needed at this time. Patient wife at the bedside with patient home Oxygen tank.

## 2015-07-29 NOTE — Consult Note (Signed)
Pt had EGD today showing small gastric ulcer, gastritis, possible duodenal polyp versus inflammed fold.  Will start clear liquid and advance to full liquid.  Carafate and PPI for treatment.  Await biopsies

## 2015-07-29 NOTE — Discharge Summary (Signed)
[facility] NAME: [name]    MR#:  [id number]  DATE OF BIRTH:  23-Dec-1940  DATE OF ADMISSION:  07/27/2015 ADMITTING PHYSICIAN: Lytle Butte, MD  DATE OF DISCHARGE: 07/29/2015  3:27 PM  PRIMARY CARE PHYSICIAN: Kirk Ruths., MD    ADMISSION DIAGNOSIS:  Melena [K92.1] Lower GI bleed [K92.2]  DISCHARGE DIAGNOSIS:  Principal Problem:   Upper GI bleed   SECONDARY DIAGNOSIS:   Past Medical History  Diagnosis Date  . Allergic rhinitis, cause unspecified   . Hyperlipidemia   . CAD (coronary artery disease)     a. 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  . Essential hypertension   . COPD (chronic obstructive pulmonary disease) ([place])     a. GOLD stage IV, started home O2. Severe bullous disease of LUL. Prolonged intubation after surgeries due to COPD.  Marland Kitchen AAA (abdominal aortic aneurysm) ([place])     a. 12/2008 s/p 7cm, endovascular repair with coiling right hypogastric artery   . Recurrent Microcytic Anemia     a. presumed chronic GI blood loss.  . Memory loss   . Type II diabetes mellitus ([place])   . Chronic diastolic CHF (congestive heart failure) ([place])     a. 04/2015 Echo: EF 55-60%, no rwma, Gr 1 DD, mild AI.  Marland Kitchen GI bleed requiring more than 4 units of blood in 24 hours, ICU, or surgery     a. Hx bleeding gastric polyps, cecal & sigmoid AVMS s/p APC 03/30/14  . AVM (arteriovenous malformation) of colon with hemorrhage   . Multiple gastric polyps   . Diverticulosis   . Essential hypertension 08/18/2009    Qualifier: Diagnosis of  By: Doy Mince LPN, Megan    . Anxiety state 09/10/2013  . Depression 01/14/2013  . Vitamin D deficiency 08/10/2014  . Leucocytosis 12/04/2013  . Insomnia 08/10/2014  . Bipolar 1 disorder, mixed, moderate ([place]) 04/16/2015  . Morbid obesity [facility])     HOSPITAL COURSE:   74 year old male with past medical history of diabetes, hypertension, hyperlipidemia,  COPD on chronic home O2, anxiety, history of previous GI bleed, history of gastric polyps and AV malformations, coronary disease, obesity, history of abdominal aortic aneurysm, who presented to the hospital due to melanotic stools.  #1 GI bleed-patient was admitted to the hospital with the working diagnosis of an upper GI bleed. -A gastroenterology consult was obtained and patient underwent an upper GI endoscopy which showed hiatal hernia and recently bleeding erosive gastropathy but no evidence of acute bleeding.  -while in the hospital patient's hemoglobin remained stable. His diet was advanced from a liquid to a full liquid/soft diet which he tolerated. Patient presently is being discharged with close follow-up with his primary care physician as an outpatient. He is currently not on any anticoagulants.  #2 anemia-acute on chronic blood loss anemia. -Patient's hemoglobin remained stable and he did not require any acute transfusion and he will continue follow-up with his primary care physician as an outpatient. -he will continue his iron supplements  #3 COPD-no acute exacerbation and patient to continue Symbicort and Spiriva. -Patient on oxygen at home.  #4 hypokalemia-this was supplemented and has improved and patient's magnesium level was also low and was supplemented prior to discharge.  #5 GERD-patient will continue Protonix.  #6 hypertension-pt. Will continue lisinopril, metoprolol.  #7 type 2 diabetes without complication-patient will continue Lantus, sliding scale insulin.  #8 anxiety/depression-patient will continue Xanax, Celexa.  #9  history of bipolar disorder-patient will continue Depakote  Patient is being discharged with home health services which are to be resumed upon discharge  DISCHARGE CONDITIONS:   Stable  CONSULTS OBTAINED:  Treatment Team:  Lytle Butte, MD Manya Silvas, MD  DRUG ALLERGIES:   Allergies  Allergen Reactions  . Penicillins Anaphylaxis,  Hives and Other (See Comments)    Pt is unable to answer additional questions about this medication.    . Demerol [Meperidine] Other (See Comments)    Reaction:  Hallucinations    . Dilaudid [Hydromorphone Hcl] Other (See Comments)    Reaction:  Hallucinations   . Morphine And Related Nausea Only and Other (See Comments)    Hallucinations, rash, shortness of breath    DISCHARGE MEDICATIONS:   Discharge Medication List as of 07/29/2015 11:53 AM    CONTINUE these medications which have NOT CHANGED   Details  acetaminophen (TYLENOL) 325 MG tablet Take 650 mg by mouth every 6 (six) hours as needed for mild pain or fever., Until Discontinued, Historical Med    albuterol (PROVENTIL HFA;VENTOLIN HFA) 108 (90 BASE) MCG/ACT inhaler Inhale 2 puffs into the lungs every 6 (six) hours as needed for wheezing or shortness of breath., Starting 05/13/2015, Until Discontinued, Print    ALPRAZolam (XANAX) 0.5 MG tablet Take 1 tablet (0.5 mg total) by mouth 3 (three) times daily as needed for anxiety (for shortness of breath)., Starting 05/13/2015, Until Discontinued, Print    atorvastatin (LIPITOR) 40 MG tablet Take 40 mg by mouth at bedtime., Until Discontinued, Historical Med    budesonide-formoterol (SYMBICORT) 160-4.5 MCG/ACT inhaler Inhale 2 puffs into the lungs 2 (two) times daily., Starting 05/13/2015, Until Discontinued, Normal    citalopram (CELEXA) 40 MG tablet Take 40 mg by mouth daily., Until Discontinued, Historical Med    divalproex (DEPAKOTE) 500 MG DR tablet Take 1 tablet (500 mg total) by mouth at bedtime., Starting 05/13/2015, Until Discontinued, Print    docusate sodium (COLACE) 100 MG capsule Take 1 capsule (100 mg total) by mouth 2 (two) times daily., Starting 05/13/2015, Until Discontinued, Normal    ferrous sulfate 325 (65 FE) MG tablet Take 650 mg by mouth 3 (three) times daily with meals., Until Discontinued, Historical Med    furosemide (LASIX) 40 MG tablet Take 1 tablet (40 mg  total) by mouth 2 (two) times daily., Starting 07/03/2015, Until Discontinued, Print    HYDROcodone-acetaminophen (NORCO/VICODIN) 5-325 MG per tablet Take 1 tablet by mouth every 6 (six) hours as needed for moderate pain., Until Discontinued, Historical Med    insulin aspart (NOVOLOG) 100 UNIT/ML injection Inject 10 Units into the skin 3 (three) times daily with meals., Starting 05/13/2015, Until Discontinued, Normal    insulin glargine (LANTUS) 100 UNIT/ML injection Inject 0.2 mLs (20 Units total) into the skin 2 (two) times daily., Starting 07/03/2015, Until Discontinued, Normal    lisinopril (PRINIVIL,ZESTRIL) 5 MG tablet Take 1 tablet (5 mg total) by mouth daily., Starting 07/03/2015, Until Discontinued, Print    metoprolol tartrate (LOPRESSOR) 25 MG tablet Take 25 mg by mouth 2 (two) times daily., Until Discontinued, Historical Med    pantoprazole (PROTONIX) 40 MG tablet Take 1 tablet (40 mg total) by mouth 2 (two) times daily., Starting 05/13/2015, Until Discontinued, Normal    potassium chloride (K-DUR,KLOR-CON) 10 MEQ tablet Take 10 mEq by mouth daily., Until Discontinued, Historical Med    sucralfate (CARAFATE) 1 G tablet Take 1 g by mouth 4 (four) times daily -  with meals and  at bedtime., Until Discontinued, Historical Med    tiotropium (SPIRIVA) 18 MCG inhalation capsule Place 1 capsule (18 mcg total) into inhaler and inhale daily., Starting 05/13/2015, Until Discontinued, Normal    Vitamin D, Ergocalciferol, (DRISDOL) 50000 UNITS CAPS capsule Take 1 capsule (50,000 Units total) by mouth every 7 (seven) days. Pt takes on Monday., Starting 05/13/2015, Until Discontinued, Normal    zolpidem (AMBIEN) 5 MG tablet Take 5 mg by mouth at bedtime as needed for sleep., Until Discontinued, Historical Med         DISCHARGE INSTRUCTIONS:   DIET:  Cardiac diet  DISCHARGE CONDITION:  Stable  ACTIVITY:  Activity as tolerated  OXYGEN:  Home Oxygen: No.   Oxygen Delivery: 3 liters/min  via Patient connected to nasal cannula oxygen  DISCHARGE LOCATION:  Home with home health nursing and PT aide.   If you experience worsening of your admission symptoms, develop shortness of breath, life threatening emergency, suicidal or homicidal thoughts you must seek medical attention immediately by calling 911 or calling your MD immediately  if symptoms less severe.  You Must read complete instructions/literature along with all the possible adverse reactions/side effects for all the Medicines you take and that have been prescribed to you. Take any new Medicines after you have completely understood and accpet all the possible adverse reactions/side effects.   Please note  You were cared for by a hospitalist during your hospital stay. If you have any questions about your discharge medications or the care you received while you were in the hospital after you are discharged, you can call the unit and asked to speak with the hospitalist on call if the hospitalist that took care of you is not available. Once you are discharged, your primary care physician will handle any further medical issues. Please note that NO REFILLS for any discharge medications will be authorized once you are discharged, as it is imperative that you return to your primary care physician (or establish a relationship with a primary care physician if you do not have one) for your aftercare needs so that they can reassess your need for medications and monitor your lab values.     Today   No evidence of acute bleeding overnight. Hemoglobin stable. Status post endoscopy showing no evidence of acute bleeding but recently bleeding erosive gastropathy.  VITAL SIGNS:  Blood pressure 116/60, pulse 98, temperature 96.3 F (35.7 C), temperature source Tympanic, resp. rate 18, height 6\' 2"  (1.88 m), weight 123.378 kg (272 lb), SpO2 95 %.  I/O:   Intake/Output Summary (Last 24 hours) at 07/29/15 1617 Last data filed at 07/29/15  1150  Gross per 24 hour  Intake    760 ml  Output   1000 ml  Net   -240 ml    PHYSICAL EXAMINATION:  GENERAL:  74 y.o.-year-old patient lying in the bed with no acute distress.  EYES: Pupils equal, round, reactive to light and accommodation. No scleral icterus. Extraocular muscles intact.  HEENT: Head atraumatic, normocephalic. Oropharynx and nasopharynx clear.  NECK:  Supple, no jugular venous distention. No thyroid enlargement, no tenderness.  LUNGS: Prolonged inspiratory and expiratory phase. no wheezing, rales,rhonchi. No use of accessory muscles of respiration.  CARDIOVASCULAR: S1, S2 normal. No murmurs, rubs, or gallops.  ABDOMEN: Soft, non-tender, non-distended. Bowel sounds present. No organomegaly or mass.  EXTREMITIES: +1 pedal edema bilaterally, no cyanosis, or clubbing.  NEUROLOGIC: Cranial nerves II through XII are intact. No focal motor or sensory defecits b/l.  PSYCHIATRIC: The patient  is alert and oriented x 3. Good affect.  SKIN: No obvious rash, lesion, or ulcer.   DATA REVIEW:   CBC  Recent Labs Lab 07/28/15 2357 07/29/15 0649  WBC 5.4  --   HGB 7.3* 7.5*  HCT 22.5*  --   PLT 159  --     Chemistries   Recent Labs Lab 07/27/15 1923 07/28/15 0012 07/29/15 0659  NA 138 139  --   K 3.0* 3.0* 3.2*  CL 94* 97*  --   CO2 34* 35*  --   GLUCOSE 213* 172*  --   BUN 29* 27*  --   CREATININE 1.18 1.21  --   CALCIUM 8.4* 8.1*  --   MG  --  1.3*  --   AST 16  --   --   ALT 8*  --   --   ALKPHOS 82  --   --   BILITOT 0.2*  --   --     Cardiac Enzymes  Recent Labs Lab 07/27/15 1923  TROPONINI <0.03    Microbiology Results  Results for orders placed or performed during the hospital encounter of 05/06/15  MRSA PCR Screening     Status: None   Collection Time: 05/06/15  7:29 PM  Result Value Ref Range Status   MRSA by PCR NEGATIVE NEGATIVE Final    Comment:        The GeneXpert MRSA Assay (FDA approved for NASAL specimens only), is one  component of a comprehensive MRSA colonization surveillance program. It is not intended to diagnose MRSA infection nor to guide or monitor treatment for MRSA infections.     RADIOLOGY:  No results found.    Management plans discussed with the patient, family and they are in agreement.  CODE STATUS:     Code Status Orders        Start     Ordered   07/27/15 2040  Full code   Continuous     07/27/15 2040      TOTAL TIME TAKING CARE OF THIS PATIENT: 40 minutes.    Henreitta Leber M.D on 07/29/2015 at 4:17 PM  Between 7am to 6pm - Pager - [phone]  After 6pm go to [email or web address] - password [email or web address] [facility]  Office  [phone]  CC: Primary care physician; Kirk Ruths., MD

## 2015-07-29 NOTE — Discharge Instructions (Addendum)
°  DIET:  Cardiac diet  DISCHARGE CONDITION:  Stable  ACTIVITY:  Activity as tolerated  OXYGEN:  Home Oxygen: Yes.     Oxygen Delivery: 2 liters/min via Patient connected to nasal cannula oxygen  DISCHARGE LOCATION:  Home with Home health RN/PT.    If you experience worsening of your admission symptoms, develop shortness of breath, life threatening emergency, suicidal or homicidal thoughts you must seek medical attention immediately by calling 911 or calling your MD immediately  if symptoms less severe.  You Must read complete instructions/literature along with all the possible adverse reactions/side effects for all the Medicines you take and that have been prescribed to you. Take any new Medicines after you have completely understood and accpet all the possible adverse reactions/side effects.   Please note  You were cared for by a hospitalist during your hospital stay. If you have any questions about your discharge medications or the care you received while you were in the hospital after you are discharged, you can call the unit and asked to speak with the hospitalist on call if the hospitalist that took care of you is not available. Once you are discharged, your primary care physician will handle any further medical issues. Please note that NO REFILLS for any discharge medications will be authorized once you are discharged, as it is imperative that you return to your primary care physician (or establish a relationship with a primary care physician if you do not have one) for your aftercare needs so that they can reassess your need for medications and monitor your lab values.

## 2015-07-30 NOTE — Anesthesia Postprocedure Evaluation (Signed)
  Anesthesia Post-op Note  Patient: [name]  Procedure(s) Performed: Procedure(s): ESOPHAGOGASTRODUODENOSCOPY (EGD) WITH PROPOFOL (N/A)  Anesthesia type:General  Patient location: PACU  Post pain: Pain level controlled  Post assessment: Post-op Vital signs reviewed, Patient's Cardiovascular Status Stable, Respiratory Function Stable, Patent Airway and No signs of Nausea or vomiting  Post vital signs: Reviewed and stable  Last Vitals:  Filed Vitals:   07/29/15 1021  BP: 116/60  Pulse: 98  Temp:   Resp: 18    Level of consciousness: awake, alert  and patient cooperative  Complications: No apparent anesthesia complications

## 2015-07-31 ENCOUNTER — Encounter: Payer: Self-pay | Admitting: *Deleted

## 2015-07-31 ENCOUNTER — Other Ambulatory Visit: Payer: Self-pay | Admitting: *Deleted

## 2015-07-31 LAB — SURGICAL PATHOLOGY

## 2015-07-31 NOTE — Patient Outreach (Signed)
Transition of care call (discharged 10/12):  Pt reports feels lousy since hospital discharge, feels came home too early, not happy with the care he received in the hospital.  Pt states he is not going to f/u with Dr. Ouida Sills, to try out Dr. Aris Lot as a primary care MD.   Pt reports  Dr. Aris Lot will come into his home, made appointment to have Dr.  Aris Lot come next week.  Pt states nurses come by checking his vital signs, thinks it is from [place].   This RN CM discussed with pt with him not f/u with Dr. Ouida Sills [facility] MD)- will have to discharge him from [place] management.   Pt states if it does not work out with Dr. Aris Lot will go back to Dr. Ouida Sills.   Provided spouse with RN CM's contact number to call if pt decides to return to Dr. Ouida Sills, explaining to both pt and spouse in order for RN CM to continue to follow- pt needs to be seeing a [facility] MD.    Spouse states it is too difficult for pt to get out, needs in home to come in.   Spouse states pt has all of his medications, needs an increase in his Ambien, to talk to Dr. Aris Lot about this.    Plan to send [facility] CMA an in basket to close case.  Plan to inform Dr. Ouida Sills of case closure- send today's encounter as well as closure letter.

## 2015-08-02 IMAGING — CT CT CTA ABD/PEL W/CM AND/OR W/O CM
2 of 9 series · 15 of 46 positions shown, 17 images · IV contrast (omnipaque)
Comparison: Multiple postoperative CTA studies with the most recent
dated 05/19/2014.

CLINICAL DATA: Status Isaxan Yubou of abdominal aortic aneurysm on
01/04/2008. Original abdominal aortic aneurysm size was
approximately 7.1 cm. Abdominal pain, groin pain and blood in stool.

EXAM:
CTA ABDOMEN AND PELVIS WITHOUT CONTRAST
TECHNIQUE: Multidetector CT imaging of the abdomen and pelvis was performed
using the standard protocol during bolus administration of
intravenous contrast. Multiplanar reconstructed images and MIPs were
obtained and reviewed to evaluate the vascular anatomy.
CONTRAST:  100mL OMNIPAQUE IOHEXOL 350 MG/ML SOLN

[Series 5: dissection 2.0 i30f 1 · axial · 0.94mm/px · z∈[-506,-44]mm · 12 of 259 slices shown, 14 images]
[im 14/259  soft-tissue]
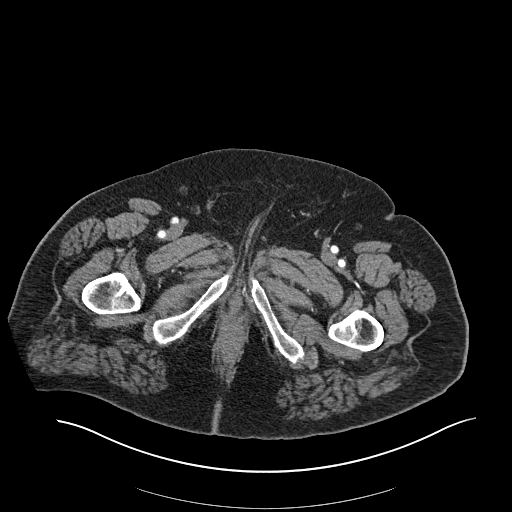
[im 14/259  bone]
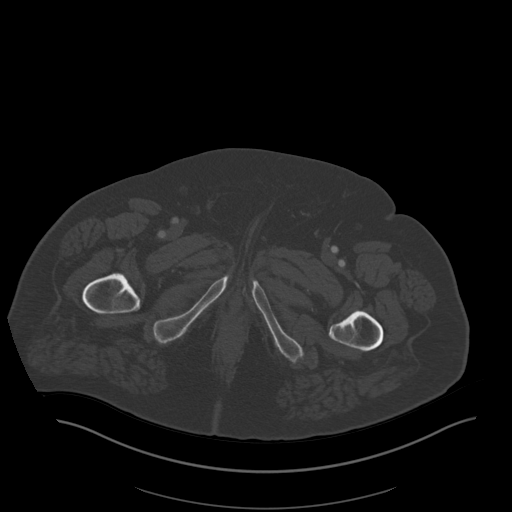
[im 41/259  soft-tissue]
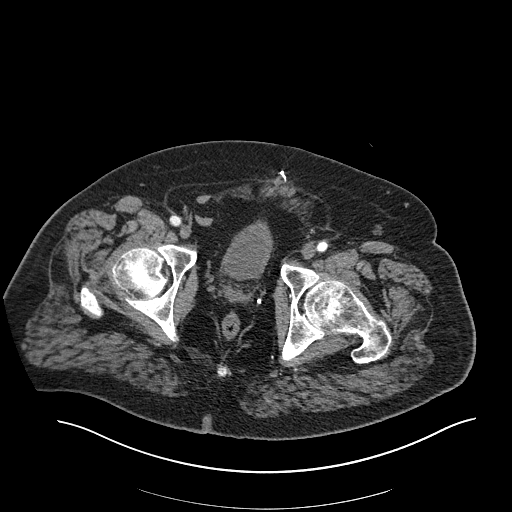
[im 55/259  soft-tissue]
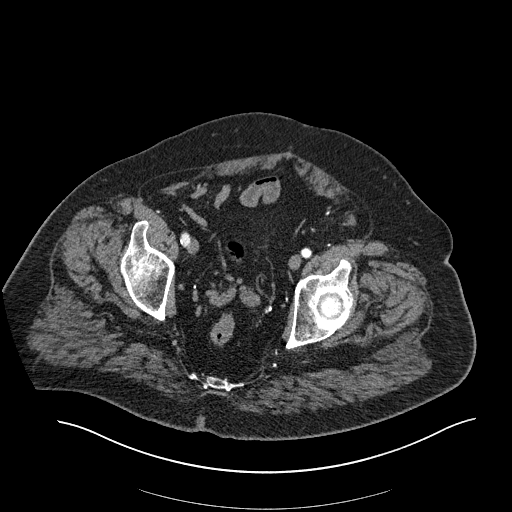
[im 82/259  soft-tissue]
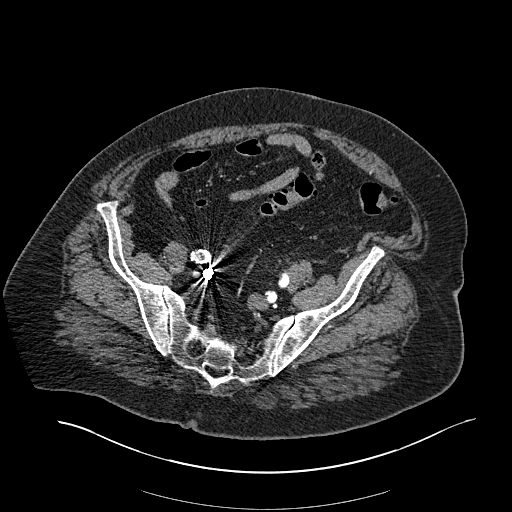
[im 96/259  soft-tissue]
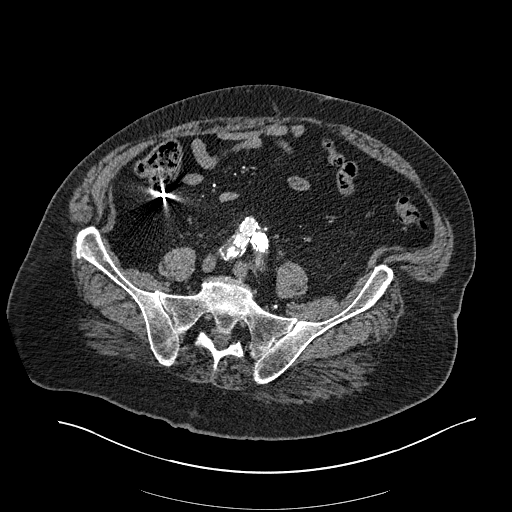
[im 123/259  soft-tissue]
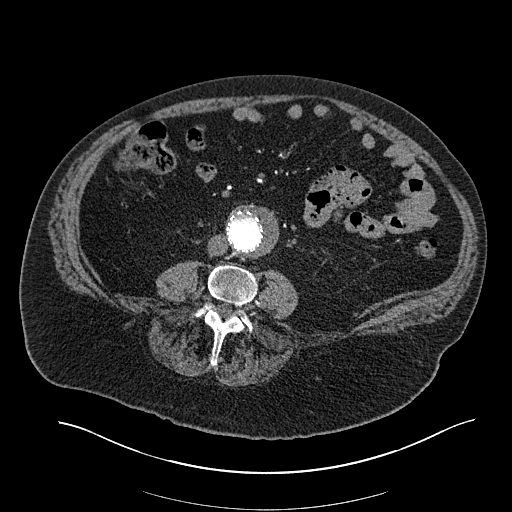
[im 136/259  soft-tissue]
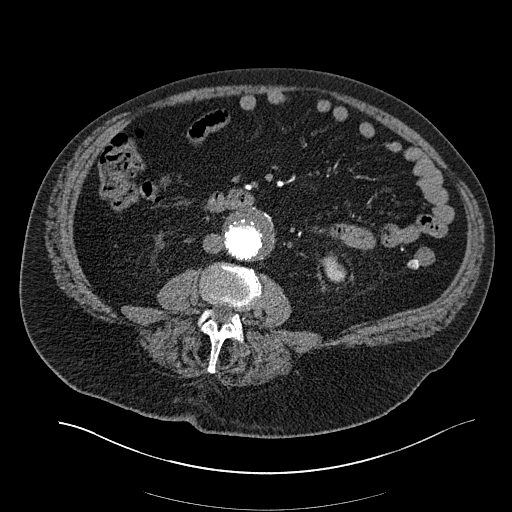
[im 163/259  soft-tissue]
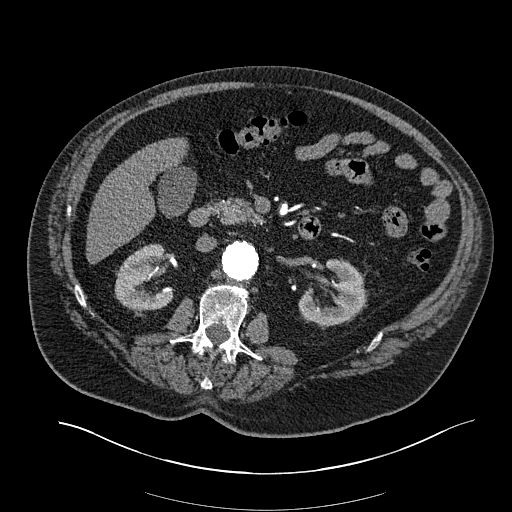
[im 177/259  soft-tissue]
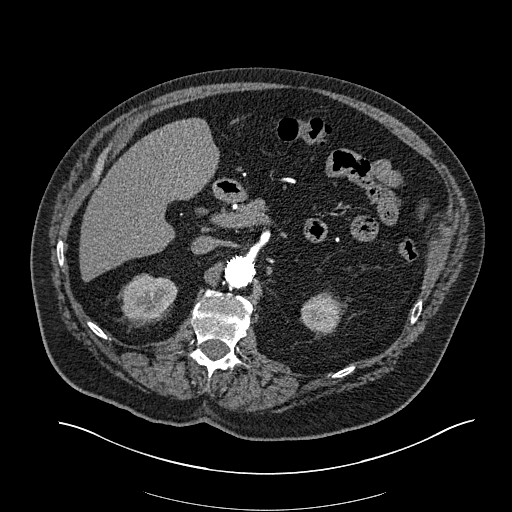
[im 177/259  bone]
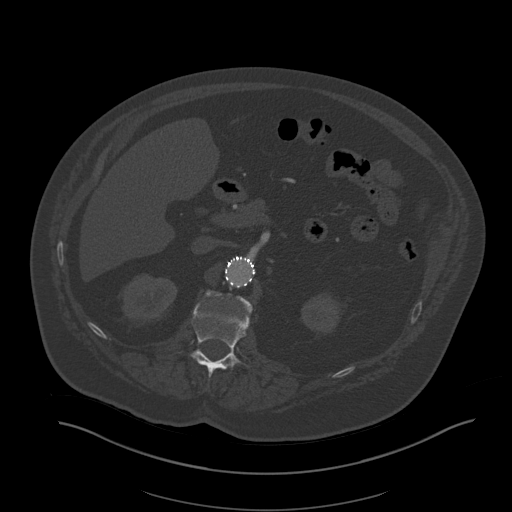
[im 204/259  soft-tissue]
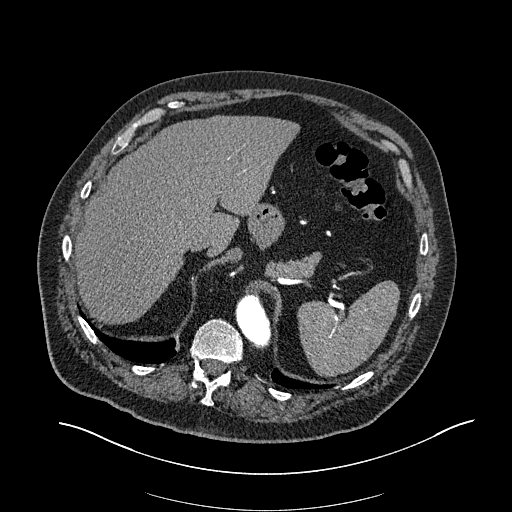
[im 218/259  soft-tissue]
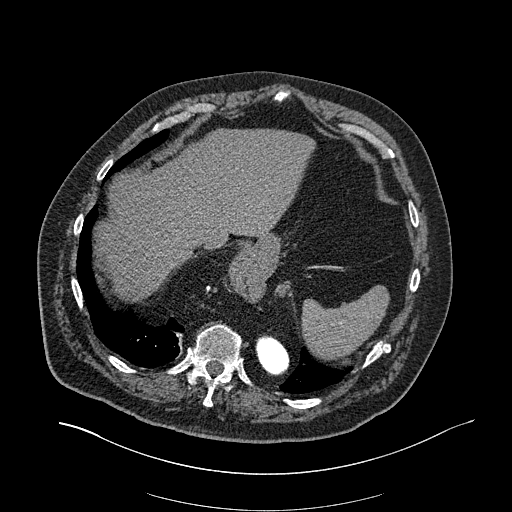
[im 245/259  soft-tissue]
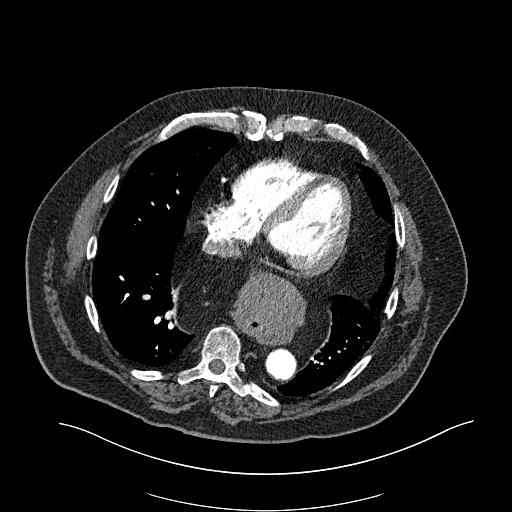

[Series 7: coronal mpr · coronal · 0.93mm/px · 3 of 164 slices shown]
[im 41/164  soft-tissue]
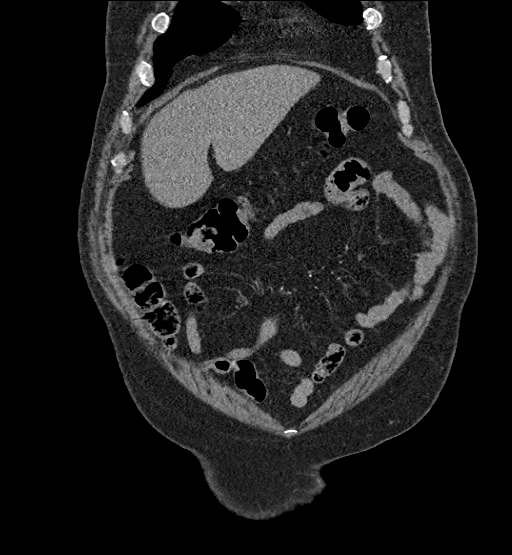
[im 82/164  soft-tissue]
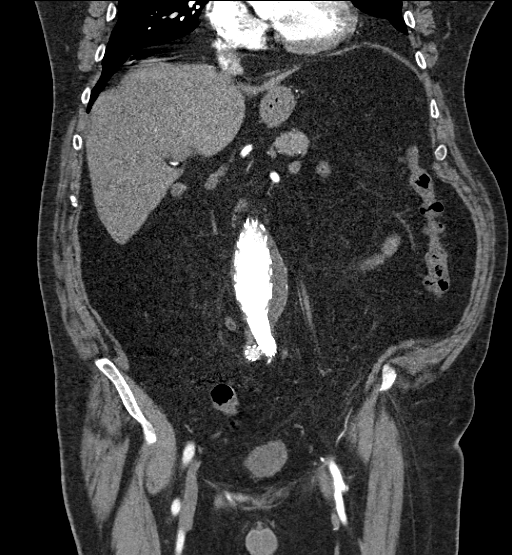
[im 123/164  soft-tissue]
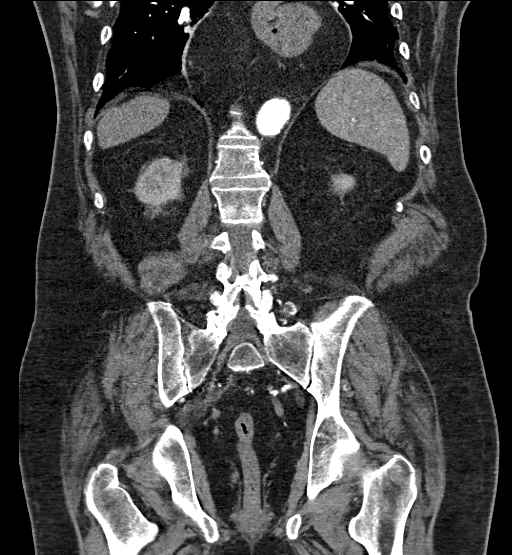

[15 of 46 positions shown; findings below may reference images not displayed]

FINDINGS: The aortic endograft shows stable patency and appearance with
extension of limbs into the left common iliac artery and right
external iliac artery. At the phase of imaging during arterial
phase, there is differential opacification with more flow present in
the left iliac limb compared to the right. The right does not appear
thrombosed and findings likely relate to differential flow and
differential contrast opacification due to relative anterior origin
of the right iliac limb and its longer overall length. Findings are
again noted related to prior coil embolization of the proximal right
internal iliac artery.

Stable mild narrowing of the proximal celiac axis likely relates to
median arcuate ligament compression based on appearance with
narrowing of approximately 50% in the lateral projection. The
superior mesenteric artery remains widely patent. Bilateral single
renal arteries show normal patency. The inferior mesenteric artery
is chronically occluded.

Thrombosed aneurysm sac surrounding the endograft shows stable size
and appearance with maximum dimensions of approximately 5.2 x
cm. There was significant delay in obtaining delayed imaging. No
obvious endoleak is identified. No evidence of retroperitoneal
hemorrhage.

Stable large hiatal hernia containing a large portion of the
stomach. The liver, gallbladder, pancreas, spleen, adrenal glands
and kidneys are unremarkable.

Bowel loops show no evidence of obstruction, ileus or obvious
lesion. No inflammation is identified. No abnormal fluid collections
are seen. There is a right inguinal hernia containing a short
segment of the distal ileum. No evidence of hernia incarceration or
strangulated bowel. No free air is identified.

No focal mass lesions or enlarged lymph nodes are seen. The bladder
is unremarkable. Stable lumbar spondylosis at L5-S1.

Review of the MIP images confirms the above findings.
IMPRESSION: 1. Stable patency of aortic endograft status Isaxan Yubou with
thrombosed aneurysm sac diameter measuring 5.4 cm in maximum
diameter. No evidence of endoleak or complications relating to the
endograft with some differential opacification noted of iliac limbs,
felt to most likely relate to flow and timing of contrast
opacification.
2. Stable patency of the celiac axis and superior mesenteric artery.
Some degree of narrowing of the proximal celiac axis up to roughly
50% appears to relate to median arcuate ligament compression.
3. No acute findings in the abdomen or pelvis.
4. Small right inguinal hernia contains a short segment of distal
ileum without evidence of incarcerated hernia or strangulated bowel.

## 2015-08-02 IMAGING — CT CT CTA ABD/PEL W/CM AND/OR W/O CM
2 of 4 series · 15 of 46 positions shown, 17 images · IV contrast (Omni 300)
Comparison: Multiple postoperative CTA studies with the most recent
dated 05/19/2014.

CLINICAL DATA: Status Isaxan Yubou of abdominal aortic aneurysm on
01/04/2008. Original abdominal aortic aneurysm size was
approximately 7.1 cm. Abdominal pain, groin pain and blood in stool.

EXAM:
CTA ABDOMEN AND PELVIS WITHOUT CONTRAST
TECHNIQUE: Multidetector CT imaging of the abdomen and pelvis was performed
using the standard protocol during bolus administration of
intravenous contrast. Multiplanar reconstructed images and MIPs were
obtained and reviewed to evaluate the vascular anatomy.
CONTRAST:  100mL OMNIPAQUE IOHEXOL 350 MG/ML SOLN

[Series 2: abd/ pelvis 5.0 i30f 1 · axial · 0.98mm/px · z∈[-506,-66]mm · 12 of 98 slices shown, 14 images]
[im 5/98  soft-tissue]
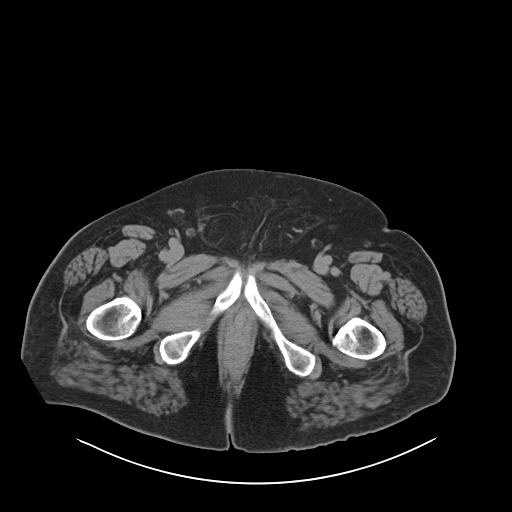
[im 5/98  bone]
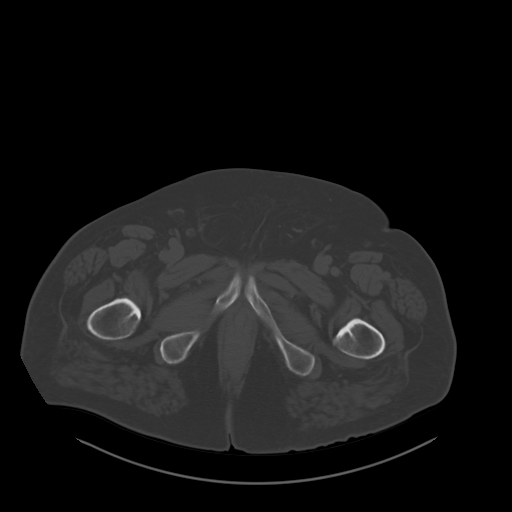
[im 13/98  soft-tissue]
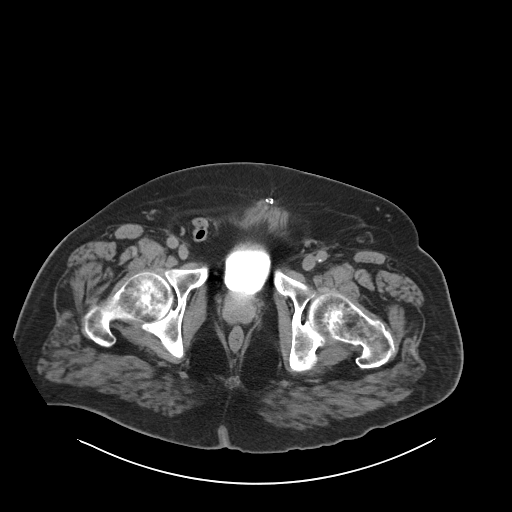
[im 22/98  soft-tissue]
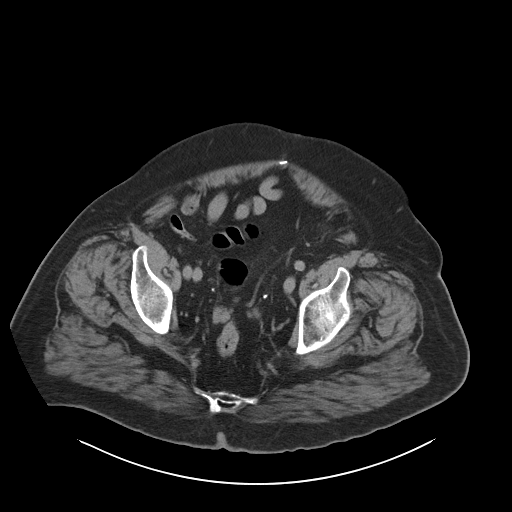
[im 30/98  soft-tissue]
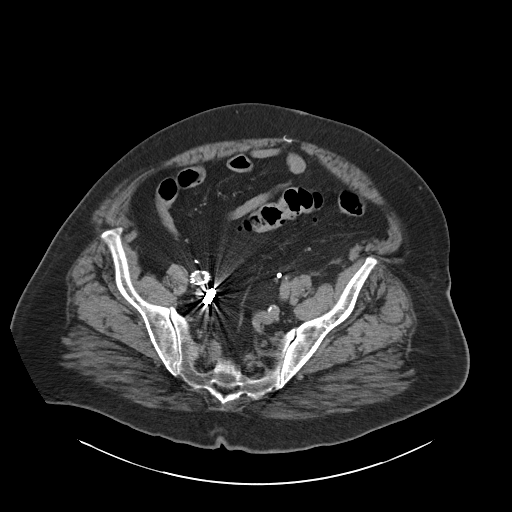
[im 38/98  soft-tissue]
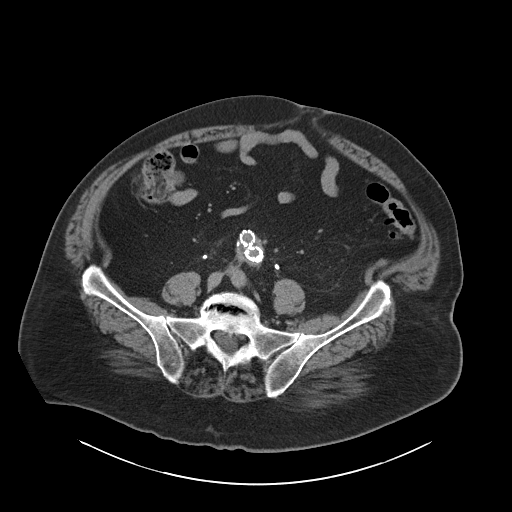
[im 47/98  soft-tissue]
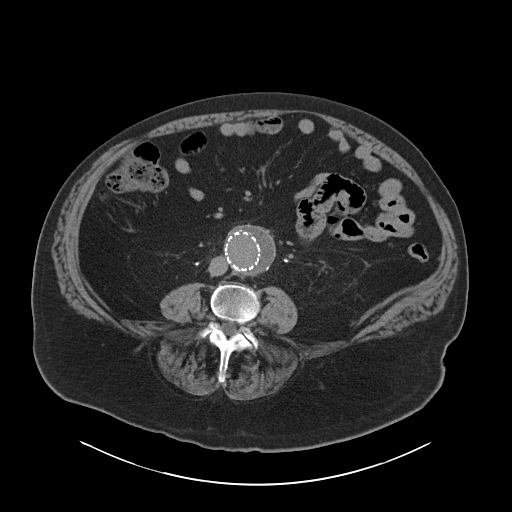
[im 51/98  soft-tissue]
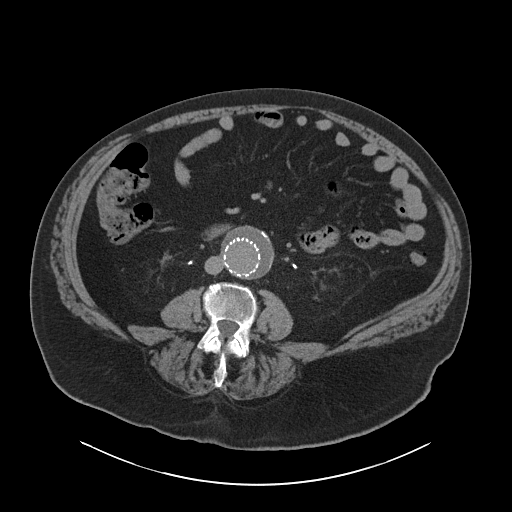
[im 60/98  soft-tissue]
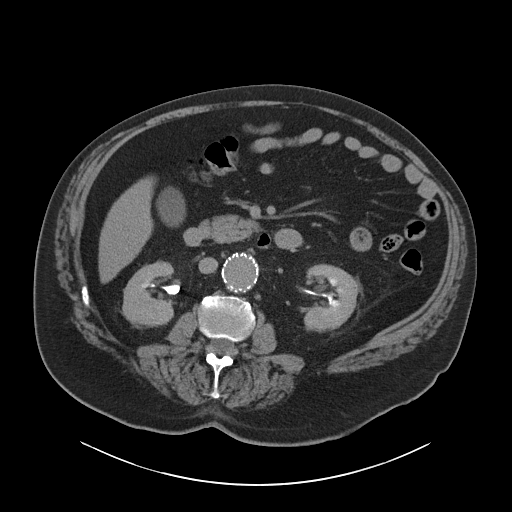
[im 68/98  soft-tissue]
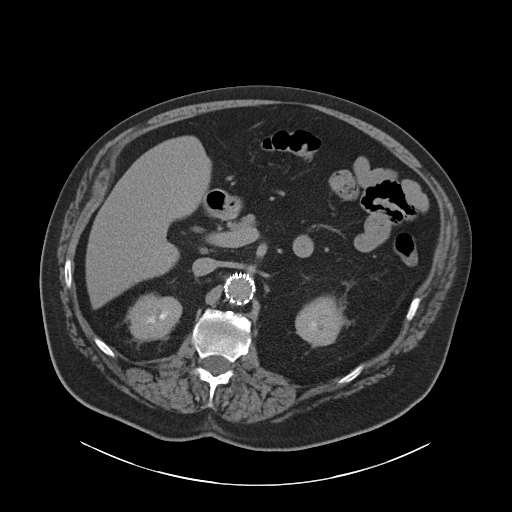
[im 68/98  bone]
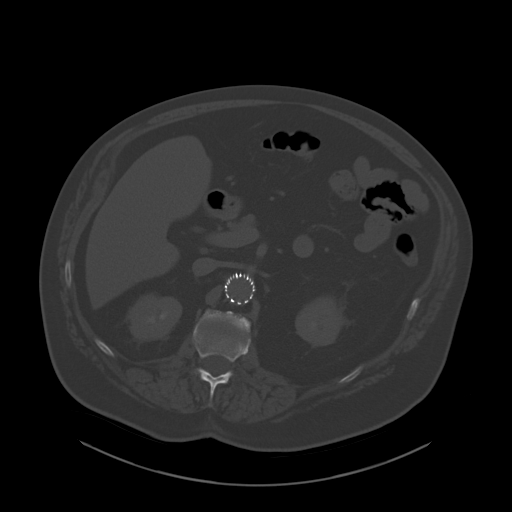
[im 76/98  soft-tissue]
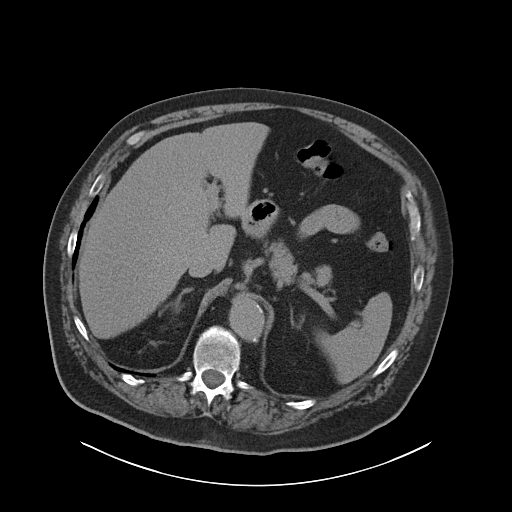
[im 85/98  soft-tissue]
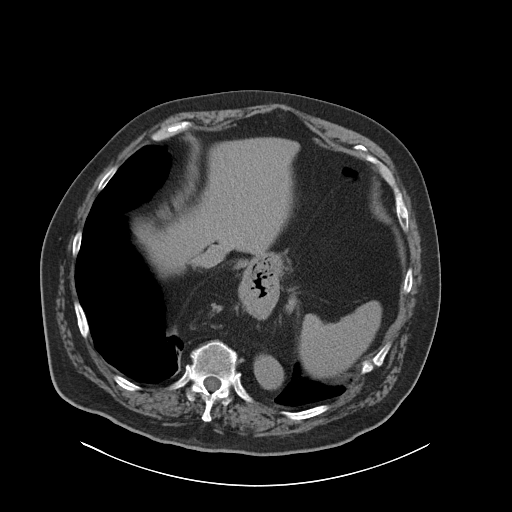
[im 93/98  soft-tissue]
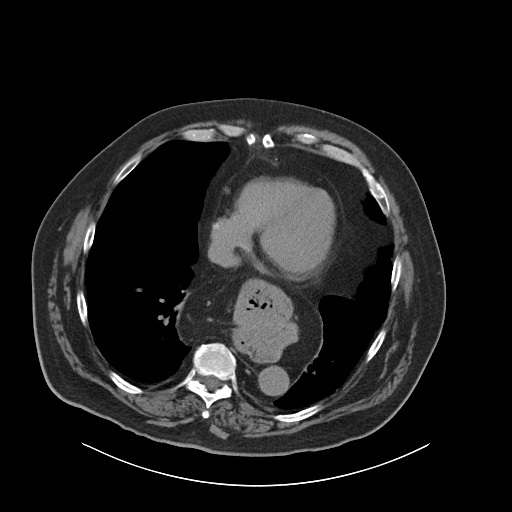

[Series 5: coronals · coronal · 0.80mm/px · 3 of 162 slices shown]
[im 54/162  soft-tissue]
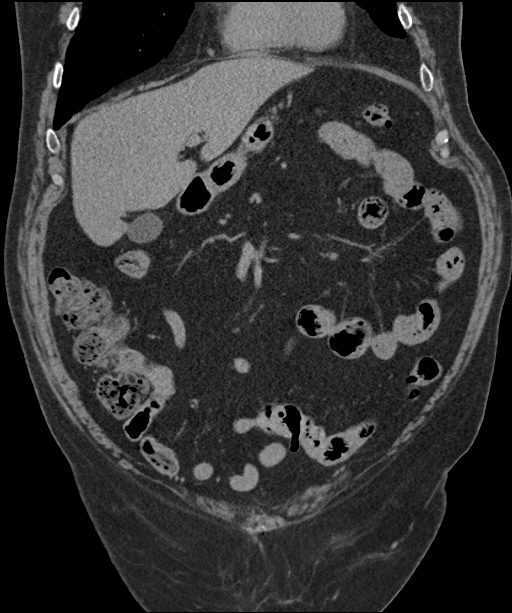
[im 72/162  soft-tissue]
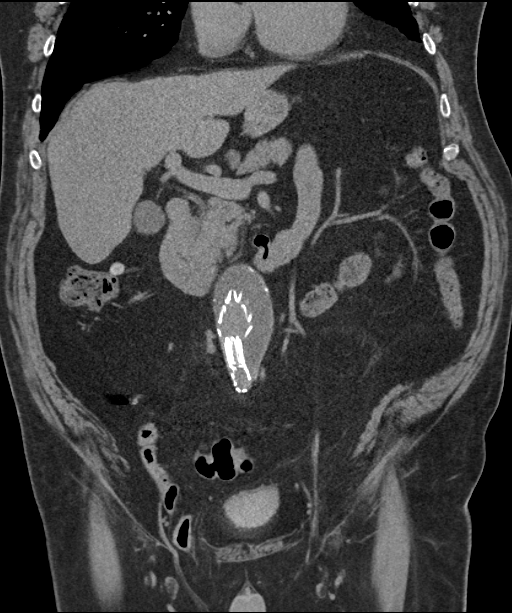
[im 90/162  soft-tissue]
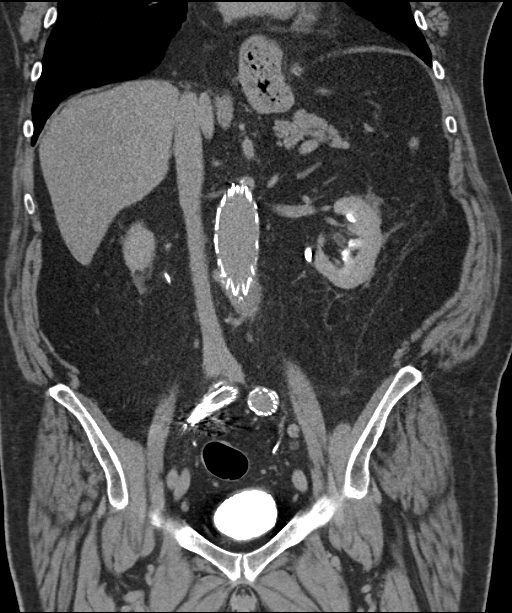

[15 of 46 positions shown; findings below may reference images not displayed]

FINDINGS: The aortic endograft shows stable patency and appearance with
extension of limbs into the left common iliac artery and right
external iliac artery. At the phase of imaging during arterial
phase, there is differential opacification with more flow present in
the left iliac limb compared to the right. The right does not appear
thrombosed and findings likely relate to differential flow and
differential contrast opacification due to relative anterior origin
of the right iliac limb and its longer overall length. Findings are
again noted related to prior coil embolization of the proximal right
internal iliac artery.

Stable mild narrowing of the proximal celiac axis likely relates to
median arcuate ligament compression based on appearance with
narrowing of approximately 50% in the lateral projection. The
superior mesenteric artery remains widely patent. Bilateral single
renal arteries show normal patency. The inferior mesenteric artery
is chronically occluded.

Thrombosed aneurysm sac surrounding the endograft shows stable size
and appearance with maximum dimensions of approximately 5.2 x
cm. There was significant delay in obtaining delayed imaging. No
obvious endoleak is identified. No evidence of retroperitoneal
hemorrhage.

Stable large hiatal hernia containing a large portion of the
stomach. The liver, gallbladder, pancreas, spleen, adrenal glands
and kidneys are unremarkable.

Bowel loops show no evidence of obstruction, ileus or obvious
lesion. No inflammation is identified. No abnormal fluid collections
are seen. There is a right inguinal hernia containing a short
segment of the distal ileum. No evidence of hernia incarceration or
strangulated bowel. No free air is identified.

No focal mass lesions or enlarged lymph nodes are seen. The bladder
is unremarkable. Stable lumbar spondylosis at L5-S1.

Review of the MIP images confirms the above findings.
IMPRESSION: 1. Stable patency of aortic endograft status Isaxan Yubou with
thrombosed aneurysm sac diameter measuring 5.4 cm in maximum
diameter. No evidence of endoleak or complications relating to the
endograft with some differential opacification noted of iliac limbs,
felt to most likely relate to flow and timing of contrast
opacification.
2. Stable patency of the celiac axis and superior mesenteric artery.
Some degree of narrowing of the proximal celiac axis up to roughly
50% appears to relate to median arcuate ligament compression.
3. No acute findings in the abdomen or pelvis.
4. Small right inguinal hernia contains a short segment of distal
ileum without evidence of incarcerated hernia or strangulated bowel.

## 2015-08-03 NOTE — Patient Outreach (Signed)
[facility]) Care Management  08/03/2015  JERMON CHALFANT 05-04-1941 [id number]   Notification from Kathie Rhodes, RN to close case due to patient is not longer eligible for [facility] services.  Thanks, Ronnell Freshwater. [place], [place] Assistant Phone: [phone] Fax: [phone]

## 2015-08-05 ENCOUNTER — Encounter: Payer: Self-pay | Admitting: Unknown Physician Specialty

## 2015-08-18 ENCOUNTER — Ambulatory Visit: Payer: Self-pay | Admitting: Cardiovascular Disease

## 2015-08-19 ENCOUNTER — Inpatient Hospital Stay
Admission: EM | Admit: 2015-08-19 | Discharge: 2015-08-22 | DRG: 378 | Disposition: A | Discharge status: Home Health | Payer: Commercial Managed Care - HMO | Attending: Internal Medicine | Admitting: Internal Medicine

## 2015-08-19 ENCOUNTER — Other Ambulatory Visit: Payer: Self-pay

## 2015-08-19 ENCOUNTER — Encounter: Payer: Self-pay | Admitting: Emergency Medicine

## 2015-08-19 DIAGNOSIS — Z6835 Body mass index (BMI) 35.0-35.9, adult: Secondary | ICD-10-CM

## 2015-08-19 DIAGNOSIS — K921 Melena: Secondary | ICD-10-CM | POA: Diagnosis not present

## 2015-08-19 DIAGNOSIS — Z7951 Long term (current) use of inhaled steroids: Secondary | ICD-10-CM

## 2015-08-19 DIAGNOSIS — Z87891 Personal history of nicotine dependence: Secondary | ICD-10-CM

## 2015-08-19 DIAGNOSIS — Z79899 Other long term (current) drug therapy: Secondary | ICD-10-CM

## 2015-08-19 DIAGNOSIS — J449 Chronic obstructive pulmonary disease, unspecified: Secondary | ICD-10-CM | POA: Diagnosis present

## 2015-08-19 DIAGNOSIS — E119 Type 2 diabetes mellitus without complications: Secondary | ICD-10-CM | POA: Diagnosis present

## 2015-08-19 DIAGNOSIS — J9610 Chronic respiratory failure, unspecified whether with hypoxia or hypercapnia: Secondary | ICD-10-CM | POA: Diagnosis present

## 2015-08-19 DIAGNOSIS — Z8249 Family history of ischemic heart disease and other diseases of the circulatory system: Secondary | ICD-10-CM

## 2015-08-19 DIAGNOSIS — D500 Iron deficiency anemia secondary to blood loss (chronic): Secondary | ICD-10-CM | POA: Diagnosis present

## 2015-08-19 DIAGNOSIS — I2510 Atherosclerotic heart disease of native coronary artery without angina pectoris: Secondary | ICD-10-CM | POA: Diagnosis present

## 2015-08-19 DIAGNOSIS — I5032 Chronic diastolic (congestive) heart failure: Secondary | ICD-10-CM | POA: Diagnosis present

## 2015-08-19 DIAGNOSIS — K922 Gastrointestinal hemorrhage, unspecified: Secondary | ICD-10-CM | POA: Diagnosis present

## 2015-08-19 DIAGNOSIS — F3160 Bipolar disorder, current episode mixed, unspecified: Secondary | ICD-10-CM | POA: Diagnosis present

## 2015-08-19 DIAGNOSIS — R0600 Dyspnea, unspecified: Secondary | ICD-10-CM

## 2015-08-19 DIAGNOSIS — K5520 Angiodysplasia of colon without hemorrhage: Secondary | ICD-10-CM | POA: Diagnosis present

## 2015-08-19 DIAGNOSIS — I252 Old myocardial infarction: Secondary | ICD-10-CM

## 2015-08-19 DIAGNOSIS — Z9981 Dependence on supplemental oxygen: Secondary | ICD-10-CM

## 2015-08-19 DIAGNOSIS — Z8711 Personal history of peptic ulcer disease: Secondary | ICD-10-CM

## 2015-08-19 DIAGNOSIS — Z825 Family history of asthma and other chronic lower respiratory diseases: Secondary | ICD-10-CM

## 2015-08-19 DIAGNOSIS — R0689 Other abnormalities of breathing: Secondary | ICD-10-CM | POA: Diagnosis present

## 2015-08-19 DIAGNOSIS — Z951 Presence of aortocoronary bypass graft: Secondary | ICD-10-CM

## 2015-08-19 DIAGNOSIS — J45909 Unspecified asthma, uncomplicated: Secondary | ICD-10-CM | POA: Diagnosis present

## 2015-08-19 DIAGNOSIS — E785 Hyperlipidemia, unspecified: Secondary | ICD-10-CM | POA: Diagnosis present

## 2015-08-19 DIAGNOSIS — I110 Hypertensive heart disease with heart failure: Secondary | ICD-10-CM | POA: Diagnosis present

## 2015-08-19 DIAGNOSIS — Z794 Long term (current) use of insulin: Secondary | ICD-10-CM

## 2015-08-19 DIAGNOSIS — Z833 Family history of diabetes mellitus: Secondary | ICD-10-CM

## 2015-08-19 MED ORDER — TIOTROPIUM BROMIDE MONOHYDRATE 18 MCG IN CAPS: 18.0000 ug | ORAL_CAPSULE | Freq: Every day | RESPIRATORY_TRACT | Status: DC

## 2015-08-19 NOTE — Telephone Encounter (Signed)
Spoke with pt whom was trying to rush off of phone. Informed we would send 1 refill on Spiriva but that he must make appointment before any further refills. Also informed that Dr. Lake Bells is no longer at the [facility] office. Pt states he will call back to make an appt with someone here at a later time.

## 2015-08-19 NOTE — ED Notes (Signed)
Pt presents to ED from home via [facility] c/o shortness of breath that started tonight while ambulating to bathroom. Pt has hx of COPD and asthma, pt used inhaler 30 min PTA. Pt also c/o GI bleeding, states was told by PCP to "get checked out" Pt states had dark tarry stool x 3 days. Per EMS pt is on 4L nasal cannula at all times, O2 reading 93%-94%, EMS increased oxygen to 6L via nasal cannula, oxygen saturation is currently 100%. Pt speaking in clear complete sentences, respirations even and unlabored, skin warm and dry.

## 2015-08-20 ENCOUNTER — Emergency Department: Payer: Commercial Managed Care - HMO

## 2015-08-20 ENCOUNTER — Encounter: Payer: Self-pay | Admitting: Internal Medicine

## 2015-08-20 DIAGNOSIS — K922 Gastrointestinal hemorrhage, unspecified: Secondary | ICD-10-CM | POA: Diagnosis present

## 2015-08-20 DIAGNOSIS — K5520 Angiodysplasia of colon without hemorrhage: Secondary | ICD-10-CM | POA: Diagnosis present

## 2015-08-20 DIAGNOSIS — Z825 Family history of asthma and other chronic lower respiratory diseases: Secondary | ICD-10-CM | POA: Diagnosis not present

## 2015-08-20 DIAGNOSIS — J449 Chronic obstructive pulmonary disease, unspecified: Secondary | ICD-10-CM | POA: Diagnosis present

## 2015-08-20 DIAGNOSIS — I252 Old myocardial infarction: Secondary | ICD-10-CM | POA: Diagnosis not present

## 2015-08-20 DIAGNOSIS — Z6835 Body mass index (BMI) 35.0-35.9, adult: Secondary | ICD-10-CM | POA: Diagnosis not present

## 2015-08-20 DIAGNOSIS — Z87891 Personal history of nicotine dependence: Secondary | ICD-10-CM | POA: Diagnosis not present

## 2015-08-20 DIAGNOSIS — R0689 Other abnormalities of breathing: Secondary | ICD-10-CM | POA: Diagnosis present

## 2015-08-20 DIAGNOSIS — F3160 Bipolar disorder, current episode mixed, unspecified: Secondary | ICD-10-CM | POA: Diagnosis present

## 2015-08-20 DIAGNOSIS — Z79899 Other long term (current) drug therapy: Secondary | ICD-10-CM | POA: Diagnosis not present

## 2015-08-20 DIAGNOSIS — E119 Type 2 diabetes mellitus without complications: Secondary | ICD-10-CM | POA: Diagnosis present

## 2015-08-20 DIAGNOSIS — J45909 Unspecified asthma, uncomplicated: Secondary | ICD-10-CM | POA: Diagnosis present

## 2015-08-20 DIAGNOSIS — J9610 Chronic respiratory failure, unspecified whether with hypoxia or hypercapnia: Secondary | ICD-10-CM | POA: Diagnosis present

## 2015-08-20 DIAGNOSIS — Z8711 Personal history of peptic ulcer disease: Secondary | ICD-10-CM | POA: Diagnosis not present

## 2015-08-20 DIAGNOSIS — D500 Iron deficiency anemia secondary to blood loss (chronic): Secondary | ICD-10-CM | POA: Diagnosis present

## 2015-08-20 DIAGNOSIS — Z8249 Family history of ischemic heart disease and other diseases of the circulatory system: Secondary | ICD-10-CM | POA: Diagnosis not present

## 2015-08-20 DIAGNOSIS — Z7951 Long term (current) use of inhaled steroids: Secondary | ICD-10-CM | POA: Diagnosis not present

## 2015-08-20 DIAGNOSIS — Z9981 Dependence on supplemental oxygen: Secondary | ICD-10-CM | POA: Diagnosis not present

## 2015-08-20 DIAGNOSIS — E785 Hyperlipidemia, unspecified: Secondary | ICD-10-CM | POA: Diagnosis present

## 2015-08-20 DIAGNOSIS — I110 Hypertensive heart disease with heart failure: Secondary | ICD-10-CM | POA: Diagnosis present

## 2015-08-20 DIAGNOSIS — Z951 Presence of aortocoronary bypass graft: Secondary | ICD-10-CM | POA: Diagnosis not present

## 2015-08-20 DIAGNOSIS — I5032 Chronic diastolic (congestive) heart failure: Secondary | ICD-10-CM | POA: Diagnosis present

## 2015-08-20 DIAGNOSIS — I2510 Atherosclerotic heart disease of native coronary artery without angina pectoris: Secondary | ICD-10-CM | POA: Diagnosis present

## 2015-08-20 DIAGNOSIS — Z833 Family history of diabetes mellitus: Secondary | ICD-10-CM | POA: Diagnosis not present

## 2015-08-20 DIAGNOSIS — K921 Melena: Secondary | ICD-10-CM | POA: Diagnosis present

## 2015-08-20 DIAGNOSIS — Z794 Long term (current) use of insulin: Secondary | ICD-10-CM | POA: Diagnosis not present

## 2015-08-20 LAB — COMPREHENSIVE METABOLIC PANEL
ALT: 9 U/L — ABNORMAL LOW (ref 17–63)
ANION GAP: 6 (ref 5–15)
AST: 17 U/L (ref 15–41)
Albumin: 3.2 g/dL — ABNORMAL LOW (ref 3.5–5.0)
Alkaline Phosphatase: 88 U/L (ref 38–126)
BUN: 21 mg/dL — ABNORMAL HIGH (ref 6–20)
CHLORIDE: 103 mmol/L (ref 101–111)
CO2: 30 mmol/L (ref 22–32)
Calcium: 8.7 mg/dL — ABNORMAL LOW (ref 8.9–10.3)
Creatinine, Ser: 1.25 mg/dL — ABNORMAL HIGH (ref 0.61–1.24)
GFR, EST NON AFRICAN AMERICAN: 55 mL/min — AB (ref >60)
Glucose, Bld: 284 mg/dL — ABNORMAL HIGH (ref 65–99)
POTASSIUM: 3.4 mmol/L — AB (ref 3.5–5.1)
Sodium: 139 mmol/L (ref 135–145)
Total Bilirubin: 0.4 mg/dL (ref 0.3–1.2)
Total Protein: 6 g/dL — ABNORMAL LOW (ref 6.5–8.1)

## 2015-08-20 LAB — GLUCOSE, CAPILLARY
Glucose-Capillary: 162 mg/dL — ABNORMAL HIGH (ref 65–99)
Glucose-Capillary: 224 mg/dL — ABNORMAL HIGH (ref 65–99)
Glucose-Capillary: 159 mg/dL — ABNORMAL HIGH (ref 65–99)
Glucose-Capillary: 140 mg/dL — ABNORMAL HIGH (ref 65–99)

## 2015-08-20 LAB — CBC
HEMATOCRIT: 21.9 % — AB (ref 40.0–52.0)
HEMATOCRIT: 25 % — AB (ref 40.0–52.0)
HEMATOCRIT: 25.9 % — AB (ref 40.0–52.0)
HEMOGLOBIN: 7.2 g/dL — AB (ref 13.0–18.0)
HEMOGLOBIN: 7.9 g/dL — AB (ref 13.0–18.0)
Hemoglobin: 8.5 g/dL — ABNORMAL LOW (ref 13.0–18.0)
MCH: 30 pg (ref 26.0–34.0)
MCH: 30.5 pg (ref 26.0–34.0)
MCH: 31 pg (ref 26.0–34.0)
MCHC: 31.5 g/dL — AB (ref 32.0–36.0)
MCHC: 32.8 g/dL (ref 32.0–36.0)
MCHC: 32.9 g/dL (ref 32.0–36.0)
MCV: 93.1 fL (ref 80.0–100.0)
MCV: 94.4 fL (ref 80.0–100.0)
MCV: 95.3 fL (ref 80.0–100.0)
Platelets: 177 10*3/uL (ref 150–440)
Platelets: 179 10*3/uL (ref 150–440)
Platelets: 217 10*3/uL (ref 150–440)
RBC: 2.32 MIL/uL — AB (ref 4.40–5.90)
RBC: 2.62 MIL/uL — ABNORMAL LOW (ref 4.40–5.90)
RBC: 2.78 MIL/uL — ABNORMAL LOW (ref 4.40–5.90)
RDW: 16.6 % — ABNORMAL HIGH (ref 11.5–14.5)
RDW: 17 % — ABNORMAL HIGH (ref 11.5–14.5)
RDW: 17.6 % — ABNORMAL HIGH (ref 11.5–14.5)
WBC: 5.3 10*3/uL (ref 3.8–10.6)
WBC: 5.6 10*3/uL (ref 3.8–10.6)
WBC: 7.6 10*3/uL (ref 3.8–10.6)

## 2015-08-20 LAB — PREPARE RBC (CROSSMATCH)

## 2015-08-20 LAB — PROTIME-INR
INR: 1.02
PROTHROMBIN TIME: 13.6 s (ref 11.4–15.0)

## 2015-08-20 LAB — OCCULT BLOOD X 1 CARD TO LAB, STOOL: FECAL OCCULT BLD: POSITIVE — AB

## 2015-08-20 LAB — TROPONIN I: TROPONIN I: 0.03 ng/mL (ref <0.031)

## 2015-08-20 MED ORDER — ONDANSETRON HCL 4 MG/2ML IJ SOLN
4.0000 mg | Freq: Four times a day (QID) | INTRAMUSCULAR | Status: DC | PRN
Start: 2015-08-20 — End: 2015-08-20

## 2015-08-20 MED ORDER — ONDANSETRON HCL 4 MG PO TABS: 4.0000 mg | ORAL_TABLET | Freq: Four times a day (QID) | ORAL | Status: DC | PRN

## 2015-08-20 MED ORDER — INFLUENZA VAC SPLIT QUAD 0.5 ML IM SUSY: 0.5000 mL | PREFILLED_SYRINGE | INTRAMUSCULAR | Status: DC

## 2015-08-20 MED ORDER — CITALOPRAM HYDROBROMIDE 20 MG PO TABS
40.0000 mg | ORAL_TABLET | Freq: Every day | ORAL | Status: DC
  Administered 2015-08-20 – 2015-08-22 (×3): 40 mg via ORAL
  Filled 2015-08-20 (×3): qty 2

## 2015-08-20 MED ORDER — ZOLPIDEM TARTRATE 5 MG PO TABS
5.0000 mg | ORAL_TABLET | Freq: Every evening | ORAL | Status: DC | PRN
  Administered 2015-08-20 – 2015-08-21 (×2): 5 mg via ORAL
  Filled 2015-08-20 (×2): qty 1

## 2015-08-20 MED ORDER — VITAMIN D (ERGOCALCIFEROL) 1.25 MG (50000 UNIT) PO CAPS
50000.0000 [IU] | ORAL_CAPSULE | ORAL | Status: DC
  Filled 2015-08-20: qty 1

## 2015-08-20 MED ORDER — ALPRAZOLAM 0.5 MG PO TABS
0.5000 mg | ORAL_TABLET | Freq: Three times a day (TID) | ORAL | Status: DC | PRN
  Administered 2015-08-20 – 2015-08-21 (×4): 0.5 mg via ORAL
  Filled 2015-08-20 (×4): qty 1

## 2015-08-20 MED ORDER — ACETAMINOPHEN 650 MG RE SUPP: 650.0000 mg | Freq: Four times a day (QID) | RECTAL | Status: DC | PRN

## 2015-08-20 MED ORDER — FUROSEMIDE 10 MG/ML IJ SOLN
20.0000 mg | Freq: Once | INTRAMUSCULAR | Status: AC
  Administered 2015-08-20: 20 mg via INTRAVENOUS
  Filled 2015-08-20: qty 2

## 2015-08-20 MED ORDER — INSULIN GLARGINE 100 UNIT/ML ~~LOC~~ SOLN
20.0000 [IU] | Freq: Two times a day (BID) | SUBCUTANEOUS | Status: DC
  Administered 2015-08-20 – 2015-08-22 (×5): 20 [IU] via SUBCUTANEOUS
  Filled 2015-08-20 (×6): qty 0.2

## 2015-08-20 MED ORDER — INSULIN ASPART 100 UNIT/ML ~~LOC~~ SOLN
0.0000 [IU] | Freq: Three times a day (TID) | SUBCUTANEOUS | Status: DC
  Administered 2015-08-20 (×3): 2 [IU] via SUBCUTANEOUS
  Administered 2015-08-21 (×2): 3 [IU] via SUBCUTANEOUS
  Administered 2015-08-21: 1 [IU] via SUBCUTANEOUS
  Administered 2015-08-22: 3 [IU] via SUBCUTANEOUS
  Filled 2015-08-20: qty 1
  Filled 2015-08-20: qty 3
  Filled 2015-08-20: qty 2
  Filled 2015-08-20: qty 3
  Filled 2015-08-20: qty 2
  Filled 2015-08-20: qty 3

## 2015-08-20 MED ORDER — DIVALPROEX SODIUM 500 MG PO DR TAB
500.0000 mg | DELAYED_RELEASE_TABLET | Freq: Every day | ORAL | Status: DC
  Administered 2015-08-20: 500 mg via ORAL
  Filled 2015-08-20 (×3): qty 1

## 2015-08-20 MED ORDER — INSULIN ASPART 100 UNIT/ML ~~LOC~~ SOLN: 0.0000 [IU] | Freq: Every day | SUBCUTANEOUS | Status: DC

## 2015-08-20 MED ORDER — TIOTROPIUM BROMIDE MONOHYDRATE 18 MCG IN CAPS
18.0000 ug | ORAL_CAPSULE | Freq: Every day | RESPIRATORY_TRACT | Status: DC
  Administered 2015-08-20 – 2015-08-22 (×3): 18 ug via RESPIRATORY_TRACT
  Filled 2015-08-20: qty 5

## 2015-08-20 MED ORDER — ALBUTEROL SULFATE (2.5 MG/3ML) 0.083% IN NEBU: 2.5000 mg | INHALATION_SOLUTION | RESPIRATORY_TRACT | Status: DC | PRN

## 2015-08-20 MED ORDER — BUDESONIDE-FORMOTEROL FUMARATE 160-4.5 MCG/ACT IN AERO
2.0000 | INHALATION_SPRAY | Freq: Two times a day (BID) | RESPIRATORY_TRACT | Status: DC
  Administered 2015-08-20 – 2015-08-22 (×4): 2 via RESPIRATORY_TRACT
  Filled 2015-08-20: qty 6

## 2015-08-20 MED ORDER — LISINOPRIL 5 MG PO TABS
5.0000 mg | ORAL_TABLET | Freq: Every day | ORAL | Status: DC
  Administered 2015-08-20 – 2015-08-22 (×3): 5 mg via ORAL
  Filled 2015-08-20 (×3): qty 1

## 2015-08-20 MED ORDER — FERROUS SULFATE 325 (65 FE) MG PO TABS
650.0000 mg | ORAL_TABLET | Freq: Three times a day (TID) | ORAL | Status: DC
  Administered 2015-08-20 – 2015-08-22 (×8): 650 mg via ORAL
  Filled 2015-08-20 (×8): qty 2

## 2015-08-20 MED ORDER — IPRATROPIUM-ALBUTEROL 0.5-2.5 (3) MG/3ML IN SOLN: 3.0000 mL | RESPIRATORY_TRACT | Status: DC | PRN

## 2015-08-20 MED ORDER — IPRATROPIUM-ALBUTEROL 0.5-2.5 (3) MG/3ML IN SOLN
RESPIRATORY_TRACT | Status: AC
  Administered 2015-08-20: 3 mL via RESPIRATORY_TRACT
  Filled 2015-08-20: qty 3

## 2015-08-20 MED ORDER — METOPROLOL TARTRATE 25 MG PO TABS
25.0000 mg | ORAL_TABLET | Freq: Two times a day (BID) | ORAL | Status: DC
  Administered 2015-08-20 – 2015-08-22 (×5): 25 mg via ORAL
  Filled 2015-08-20 (×5): qty 1

## 2015-08-20 MED ORDER — SODIUM CHLORIDE 0.9 % IV SOLN
Freq: Once | INTRAVENOUS | Status: AC
  Administered 2015-08-20: 14:00:00 via INTRAVENOUS

## 2015-08-20 MED ORDER — PANTOPRAZOLE SODIUM 40 MG IV SOLR
40.0000 mg | Freq: Two times a day (BID) | INTRAVENOUS | Status: DC
  Administered 2015-08-20 – 2015-08-22 (×5): 40 mg via INTRAVENOUS
  Filled 2015-08-20 (×5): qty 40

## 2015-08-20 MED ORDER — FUROSEMIDE 40 MG PO TABS
40.0000 mg | ORAL_TABLET | Freq: Two times a day (BID) | ORAL | Status: DC
  Administered 2015-08-20 – 2015-08-22 (×4): 40 mg via ORAL
  Filled 2015-08-20 (×4): qty 1

## 2015-08-20 MED ORDER — ZOLPIDEM TARTRATE 5 MG PO TABS
5.0000 mg | ORAL_TABLET | Freq: Every evening | ORAL | Status: DC | PRN
  Filled 2015-08-20: qty 1

## 2015-08-20 MED ORDER — SUCRALFATE 1 G PO TABS
1.0000 g | ORAL_TABLET | Freq: Three times a day (TID) | ORAL | Status: DC
  Administered 2015-08-20 – 2015-08-22 (×8): 1 g via ORAL
  Filled 2015-08-20 (×9): qty 1

## 2015-08-20 MED ORDER — IPRATROPIUM-ALBUTEROL 0.5-2.5 (3) MG/3ML IN SOLN
3.0000 mL | Freq: Once | RESPIRATORY_TRACT | Status: AC
  Administered 2015-08-20: 3 mL via RESPIRATORY_TRACT

## 2015-08-20 MED ORDER — HYDROCODONE-ACETAMINOPHEN 5-325 MG PO TABS
1.0000 | ORAL_TABLET | Freq: Four times a day (QID) | ORAL | Status: DC | PRN
  Administered 2015-08-20 – 2015-08-21 (×3): 1 via ORAL
  Filled 2015-08-20 (×3): qty 1

## 2015-08-20 MED ORDER — ONDANSETRON HCL 4 MG/2ML IJ SOLN: 4.0000 mg | Freq: Four times a day (QID) | INTRAMUSCULAR | Status: DC | PRN

## 2015-08-20 MED ORDER — ATORVASTATIN CALCIUM 20 MG PO TABS
40.0000 mg | ORAL_TABLET | Freq: Every day | ORAL | Status: DC
  Administered 2015-08-20 – 2015-08-21 (×2): 40 mg via ORAL
  Filled 2015-08-20 (×2): qty 2

## 2015-08-20 MED ORDER — ACETAMINOPHEN 325 MG PO TABS
650.0000 mg | ORAL_TABLET | Freq: Four times a day (QID) | ORAL | Status: DC | PRN
  Administered 2015-08-21: 650 mg via ORAL
  Filled 2015-08-20: qty 2

## 2015-08-20 NOTE — Progress Notes (Signed)
[facility] [place] NAME: [name]    MR#:  [id number]  DATE OF BIRTH:  15-Jul-1941  SUBJECTIVE:  Patient states that he has had dark-colored stools. He denies abdominal pain.  REVIEW OF SYSTEMS:    Review of Systems  Constitutional: Negative for fever, chills and malaise/fatigue.  HENT: Negative for sore throat.   Eyes: Negative for blurred vision.  Respiratory: Negative for cough, hemoptysis, shortness of breath and wheezing.   Cardiovascular: Negative for chest pain, palpitations and leg swelling.  Gastrointestinal: Positive for blood in stool and melena. Negative for nausea, vomiting, abdominal pain and diarrhea.  Genitourinary: Negative for dysuria.  Musculoskeletal: Negative for back pain.  Neurological: Negative for dizziness, tremors, sensory change, speech change and headaches.  Endo/Heme/Allergies: Does not bruise/bleed easily.    Tolerating Diet: Yes      DRUG ALLERGIES:   Allergies  Allergen Reactions  . Penicillins Anaphylaxis, Hives and Other (See Comments)    Pt is unable to answer additional questions about this medication.    . Demerol [Meperidine] Other (See Comments)    Reaction:  Hallucinations    . Dilaudid [Hydromorphone Hcl] Other (See Comments)    Reaction:  Hallucinations   . Morphine And Related Nausea Only and Other (See Comments)    Hallucinations, rash, shortness of breath    VITALS:  Blood pressure 109/42, pulse 65, temperature 97.8 F (36.6 C), temperature source Oral, resp. rate 20, height 6\' 2"  (1.88 m), weight 131.18 kg (289 lb 3.2 oz), SpO2 100 %.  PHYSICAL EXAMINATION:   Physical Exam  Constitutional: He is oriented to person, place, and time and well-developed, well-nourished, and in no distress. No distress.  HENT:  Head: Normocephalic.  Eyes: No scleral icterus.  Neck: Normal range of motion. Neck supple. No JVD present. No tracheal deviation present.   Cardiovascular: Normal rate, regular rhythm and normal heart sounds.  Exam reveals no gallop and no friction rub.   No murmur heard. Pulmonary/Chest: Effort normal and breath sounds normal. No respiratory distress. He has no wheezes. He has no rales. He exhibits no tenderness.  Abdominal: Soft. Bowel sounds are normal. He exhibits no distension and no mass. There is no tenderness. There is no rebound and no guarding.  Musculoskeletal: Normal range of motion. He exhibits no edema.  Neurological: He is alert and oriented to person, place, and time.  Skin: Skin is warm. No rash noted. No erythema.  Psychiatric: Affect and judgment normal.      LABORATORY PANEL:   CBC  Recent Labs Lab 08/20/15 0559  WBC 5.3  HGB 7.2*  HCT 21.9*  PLT 177   ------------------------------------------------------------------------------------------------------------------  Chemistries   Recent Labs Lab 08/20/15 0024  NA 139  K 3.4*  CL 103  CO2 30  GLUCOSE 284*  BUN 21*  CREATININE 1.25*  CALCIUM 8.7*  AST 17  ALT 9*  ALKPHOS 88  BILITOT 0.4   ------------------------------------------------------------------------------------------------------------------  Cardiac Enzymes  Recent Labs Lab 08/20/15 0024  TROPONINI 0.03   ------------------------------------------------------------------------------------------------------------------  RADIOLOGY:  Dg Chest Portable 1 View  08/20/2015  CLINICAL DATA:  Shortness of breath beginning last night. History of COPD and asthma. EXAM: PORTABLE CHEST 1 VIEW COMPARISON:  Chest radiograph June 30, 2015 FINDINGS: The cardiac silhouette is moderately enlarged, mediastinal silhouette is nonsuspicious, status post median sternotomy. Mild pulmonary vascular congestion. Elevated LEFT hemidiaphragm. No pleural effusion or focal consolidation. No pneumothorax. Large body habitus, osseous structure nonsuspicious. Interval removal of  RIGHT PICC. Large  partially air-filled hiatal hernia. IMPRESSION: Similar cardiomegaly, pulmonary vascular congestion. Large hiatal hernia. Electronically Signed   By: Elon Alas M.D.   On: 08/20/2015 00:57     ASSESSMENT AND PLAN:   74 year old male with a history of GI bleed who presents with upper GI bleed.  1. 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 He did take aspirin which may have precipitated this episode. Patient's currently on Protonix which I'll continue. GI consultation is still pending. Hemoglobin is at 7.2 this morning. I will transfuse 1 unit of PRBCs.   2. COPD: Patient does not appear to have acute exacerbation. Continue Spiriva and Symbicort. Continue 4 L of oxygen which is his baseline.  3. Hyperlipidemia: Continue Lipitor  4. Type 2 diabetes: Continue Lantus, sliding scale insulin and ADA diet.   5. Depression and anxiety: Continue Celexa and Xanax.   Management plans discussed with the patient and he is in agreement.  CODE STATUS: FULL  TOTAL TIME TAKING CARE OF THIS PATIENT: 30 minutes.     POSSIBLE D/C 1-2 days, DEPENDING ON CLINICAL CONDITION.   Tove Wideman M.D on 08/20/2015 at 12:06 PM  Between 7am to 6pm - Pager - [phone] After 6pm go to [email or web address] - password [email or web address] [facility]  Office  [phone]  CC: Primary care physician; Kirk Ruths., MD  Note: This dictation was prepared with Dragon dictation along with smaller phrase technology. Any transcriptional errors that result from this process are unintentional.

## 2015-08-20 NOTE — Consult Note (Signed)
  Pt seen and examined. Please see C. Celesta Aver' notes. Pt known to me from previous hospitalization. Pt with multiple endoscopies recently. Neg video capsule study. On last admission, Dr. [name] saw pt and did EGD, which showed erosive gastritis, prob from NSAIDS use. Now with melena, increasing SOB, nausea, vague abdominal pain, and weakness. Denies recent ASA/NSAIDS use.Stool heme positive. Pt is getting blood transfusion now. That may help with weakness and SOB. Continue to moniter hgb. Continue PPI BID for now. No plans for EGD unless pt shows active bleeding. thanks

## 2015-08-20 NOTE — Progress Notes (Signed)
Pt was thought importance of SCDs , but he is refusing to wear any
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
## 2015-08-21 LAB — CBC
HEMATOCRIT: 25.2 % — AB (ref 40.0–52.0)
HEMOGLOBIN: 8 g/dL — AB (ref 13.0–18.0)
MCH: 29.8 pg (ref 26.0–34.0)
MCHC: 31.8 g/dL — AB (ref 32.0–36.0)
MCV: 93.8 fL (ref 80.0–100.0)
PLATELETS: 183 10*3/uL (ref 150–440)
RBC: 2.69 MIL/uL — ABNORMAL LOW (ref 4.40–5.90)
RDW: 17.9 % — ABNORMAL HIGH (ref 11.5–14.5)
WBC: 4.9 10*3/uL (ref 3.8–10.6)

## 2015-08-21 LAB — BASIC METABOLIC PANEL
ANION GAP: 5 (ref 5–15)
BUN: 11 mg/dL (ref 6–20)
CALCIUM: 8.3 mg/dL — AB (ref 8.9–10.3)
CO2: 33 mmol/L — ABNORMAL HIGH (ref 22–32)
Chloride: 103 mmol/L (ref 101–111)
Creatinine, Ser: 1.05 mg/dL (ref 0.61–1.24)
GFR calc Af Amer: >60 mL/min (ref >60)
Glucose, Bld: 142 mg/dL — ABNORMAL HIGH (ref 65–99)
POTASSIUM: 3.4 mmol/L — AB (ref 3.5–5.1)
SODIUM: 141 mmol/L (ref 135–145)

## 2015-08-21 LAB — GLUCOSE, CAPILLARY
GLUCOSE-CAPILLARY: 134 mg/dL — AB (ref 65–99)
GLUCOSE-CAPILLARY: 209 mg/dL — AB (ref 65–99)
Glucose-Capillary: 212 mg/dL — ABNORMAL HIGH (ref 65–99)
Glucose-Capillary: 125 mg/dL — ABNORMAL HIGH (ref 65–99)

## 2015-08-21 LAB — TYPE AND SCREEN
ABO/RH(D): B NEG
ANTIBODY SCREEN: NEGATIVE
UNIT DIVISION: 0

## 2015-08-21 MED ORDER — POTASSIUM CHLORIDE 20 MEQ/15ML (10%) PO SOLN
40.0000 meq | Freq: Once | ORAL | Status: AC
  Administered 2015-08-21: 40 meq via ORAL
  Filled 2015-08-21: qty 30

## 2015-08-21 NOTE — Evaluation (Signed)
Physical Therapy Evaluation Patient Details Name: [name] MRN: [id number] DOB: 11-21-1940 Today's Date: 08/21/2015   History of Present Illness  Pt is a 74 y.o. male presenting to hospital with SOB and black tarry stools x3 days; pt admitted with acute repsiratory insufficiency and upper GI bleed and s/p 1 unit PRBC's.  PMH includes:  chronic home O2 4 L/min, COPD, chronic respiratory failure, chronic GI bleed, AAA.  Clinical Impression  Currently pt demonstrates impairments with strength, balance, pulmonary endurance, and limitations with functional mobility.  Prior to admission, pt was independent ambulating without AD in home and is on chronic 4 L/min O2 via nasal cannula.  Pt lives with his wife in 1 level home with 5 steps to enter with B railing.  Currently pt is CGA to min assist with transfers and ambulation with RW; limited distance d/t SOB.  Pt would benefit from skilled PT to address above noted impairments and functional limitations.  Recommend pt discharge to home with support of family and use of RW when medically appropriate.     Follow Up Recommendations Home health PT;Supervision for mobility/OOB    Equipment Recommendations       Recommendations for Other Services       Precautions / Restrictions Precautions Precautions: Fall Restrictions Weight Bearing Restrictions: No      Mobility  Bed Mobility Overal bed mobility: Modified Independent             General bed mobility comments: Supine to/from sit with HOB flat  Transfers Overall transfer level: Needs assistance Equipment used: Rolling walker (2 wheeled) Transfers: Sit to/from Stand Sit to Stand: Min guard;Min assist         General transfer comment: increased effort to stand but steady  Ambulation/Gait Ambulation/Gait assistance: Min guard;Min assist Ambulation Distance (Feet): 50 Feet (in room) Assistive device: Rolling walker (2 wheeled)   Gait velocity: decreased   General Gait  Details: pt with flexed posture and required vc's to stand upright and stand closer to RW; step through gait pattern  Stairs            Wheelchair Mobility    Modified Rankin (Stroke Patients Only)       Balance Overall balance assessment: Needs assistance Sitting-balance support: No upper extremity supported;Feet supported Sitting balance-Leahy Scale: Good     Standing balance support: Bilateral upper extremity supported (on RW) Standing balance-Leahy Scale: Good                               Pertinent Vitals/Pain Pain Assessment: 0-10 Pain Score: 3  Pain Location: R knee Pain Descriptors / Indicators: Sore (chronic) Pain Intervention(s): Limited activity within patient's tolerance;Monitored during session  Vitals stable and WFL throughout treatment session on 4 L/min via nasal cannula    Home Living Family/patient expects to be discharged to:: Private residence Living Arrangements: Spouse/significant other Available Help at Discharge: Family Type of Home: House Home Access: Stairs to enter Entrance Stairs-Rails: Psychiatric nurse of Steps: 5 Home Layout: One level Home Equipment: Environmental consultant - 2 wheels;Cane - single point;Bedside commode      Prior Function Level of Independence: Independent         Comments: Pt ambulated within home without AD; uses 4 L/min O2 chronically; pt reports a couple falls in past 6 months; takes sponge baths     Hand Dominance        Extremity/Trunk Assessment   Upper Extremity  Assessment: Generalized weakness           Lower Extremity Assessment: Generalized weakness         Communication   Communication: No difficulties  Cognition Arousal/Alertness: Awake/alert Behavior During Therapy: WFL for tasks assessed/performed Overall Cognitive Status: Within Functional Limits for tasks assessed                      General Comments   Pt agreeable to PT session and expressing his  concerns and goals of therapy.  Pacing/energy conservation and HEP of walking with RW with staff discussed with pt.    Exercises        Assessment/Plan    PT Assessment Patient needs continued PT services  PT Diagnosis Difficulty walking;Generalized weakness   PT Problem List Decreased strength;Decreased activity tolerance;Decreased balance;Decreased mobility;Cardiopulmonary status limiting activity  PT Treatment Interventions DME instruction;Gait training;Stair training;Functional mobility training;Therapeutic activities;Therapeutic exercise;Balance training;Neuromuscular re-education;Patient/family education   PT Goals (Current goals can be found in the Care Plan section) Acute Rehab PT Goals Patient Stated Goal: to go home and have the energy and strength to walk within the home and perform daily tasks as needed PT Goal Formulation: With patient Time For Goal Achievement: 09/04/15 Potential to Achieve Goals: Good    Frequency Min 2X/week   Barriers to discharge        Co-evaluation               End of Session Equipment Utilized During Treatment: Gait belt;Oxygen Activity Tolerance: Patient tolerated treatment well Patient left: in bed;with call bell/phone within reach;with bed alarm set;with nursing/sitter in room (B LE's elevated on 2 pillows) Nurse Communication: Mobility status (O2 sats with activity)         Time: 1449-1530 PT Time Calculation (min) (ACUTE ONLY): 41 min   Charges:   PT Evaluation $Initial PT Evaluation Tier I: 1 Procedure PT Treatments $Therapeutic Exercise: 8-22 mins   PT G CodesLeitha Bleak Aug 25, 2015, 3:52 PM Leitha Bleak, [place]

## 2015-08-21 NOTE — Progress Notes (Signed)
Patient took evening meds and wanted to know results from his blood work today.  Advised and he said that they drew more than one test today.  Called lab and they advised they only drew one blood work today and patient became very upset and aggressive and stated that was incorrect.  Patient started to ask for Xanax, informed patient that he could get it at 2100 and he got further upset.  He proceeded to agrue with me that he took it as needed at home and I explained policy to him and that it was prescribed as needed PRN three times a day which equals every 8 hours.  He would not accept this reasoning and said that he would refuse to take any other meds and see where that got [place].  Unable to give him his carafate.

## 2015-08-21 NOTE — Care Management (Signed)
Patient is open to [facility]

## 2015-08-21 NOTE — Progress Notes (Signed)
[place] [facility] [place] NAME: [name]    MR#:  [id number]  DATE OF BIRTH:  [birth year]  SUBJECTIVE:  Patient has not had a bowel movement since admission. Patient denies shortness of breath or wheezing. Patient is complaining of generalized weakness  REVIEW OF SYSTEMS:    Review of Systems  Constitutional: Negative for fever, chills and malaise/fatigue.  HENT: Negative for sore throat.   Eyes: Negative for blurred vision.  Respiratory: Negative for cough, hemoptysis, shortness of breath and wheezing.   Cardiovascular: Negative for chest pain, palpitations and leg swelling.  Gastrointestinal: Negative for nausea, vomiting, abdominal pain, diarrhea, blood in stool and melena.  Genitourinary: Negative for dysuria.  Musculoskeletal: Negative for back pain.  Neurological: Negative for dizziness, tremors, sensory change, speech change and headaches.  Endo/Heme/Allergies: Does not bruise/bleed easily.    Tolerating Diet: Yes      DRUG ALLERGIES:   Allergies  Allergen Reactions  . Penicillins Anaphylaxis, Hives and Other (See Comments)    Pt is unable to answer additional questions about this medication.    . Demerol [Meperidine] Other (See Comments)    Reaction:  Hallucinations    . Dilaudid [Hydromorphone Hcl] Other (See Comments)    Reaction:  Hallucinations   . Morphine And Related Nausea Only and Other (See Comments)    Hallucinations, rash, shortness of breath    VITALS:  Blood pressure 122/72, pulse 73, temperature 97.6 F (36.4 C), temperature source Axillary, resp. rate 18, height 6\' 2"  (1.88 m), weight 131.18 kg (289 lb 3.2 oz), SpO2 98 %.  PHYSICAL EXAMINATION:   Physical Exam  Constitutional: He is oriented to person, place, and time and well-developed, well-nourished, and in no distress. No distress.  HENT:  Head: Normocephalic.  Eyes: No scleral icterus.  Neck: Normal range of motion. Neck supple. No  JVD present. No tracheal deviation present.  Cardiovascular: Normal rate, regular rhythm and normal heart sounds.  Exam reveals no gallop and no friction rub.   No murmur heard. Pulmonary/Chest: Effort normal and breath sounds normal. No respiratory distress. He has no wheezes. He has no rales. He exhibits no tenderness.  Abdominal: Soft. Bowel sounds are normal. He exhibits no distension and no mass. There is no tenderness. There is no rebound and no guarding.  Musculoskeletal: Normal range of motion. He exhibits no edema.  Neurological: He is alert and oriented to person, place, and time.  Skin: Skin is warm. No rash noted. No erythema.  Psychiatric: Affect and judgment normal.      LABORATORY PANEL:   CBC  Recent Labs Lab 08/21/15 0604  WBC 4.9  HGB 8.0*  HCT 25.2*  PLT 183   ------------------------------------------------------------------------------------------------------------------  Chemistries   Recent Labs Lab 08/20/15 0024 08/21/15 0604  NA 139 141  K 3.4* 3.4*  CL 103 103  CO2 30 33*  GLUCOSE 284* 142*  BUN 21* 11  CREATININE 1.25* 1.05  CALCIUM 8.7* 8.3*  AST 17  --   ALT 9*  --   ALKPHOS 88  --   BILITOT 0.4  --    ------------------------------------------------------------------------------------------------------------------  Cardiac Enzymes  Recent Labs Lab 08/20/15 0024  TROPONINI 0.03   ------------------------------------------------------------------------------------------------------------------  RADIOLOGY:  Dg Chest Portable 1 View  08/20/2015  CLINICAL DATA:  Shortness of breath beginning last night. History of COPD and asthma. EXAM: PORTABLE CHEST 1 VIEW COMPARISON:  Chest radiograph June 30, 2015 FINDINGS: The cardiac silhouette is moderately enlarged, mediastinal silhouette is  nonsuspicious, status post median sternotomy. Mild pulmonary vascular congestion. Elevated LEFT hemidiaphragm. No pleural effusion or focal  consolidation. No pneumothorax. Large body habitus, osseous structure nonsuspicious. Interval removal of RIGHT PICC. Large partially air-filled hiatal hernia. IMPRESSION: Similar cardiomegaly, pulmonary vascular congestion. Large hiatal hernia. Electronically Signed   By: Elon Alas M.D.   On: 08/20/2015 00:57     ASSESSMENT AND PLAN:   74 year old male with a history of GI bleed who presents with upper GI bleed.  1. Upper GI bleed: He had recent EGD and has known AV malformations. He did take aspirin which may have precipitated this episode. Patient's currently on Protonix. Patient received 1 unit of PRBCs. His hemoglobin is at 8.0 today. GI is not recommending another EGD at this time unless he shows signs of active bleeding. CBC will be ordered for a.m.  2. COPD: Patient does not appear to have acute exacerbation. Continue Spiriva and Symbicort. Continue 4 L of oxygen which is his baseline.  3. Hyperlipidemia: Continue Lipitor  4. Type 2 diabetes: Continue Lantus, sliding scale insulin and ADA diet.   5. Depression and anxiety: Continue Celexa and Xanax.  PT consult ordered Management plans discussed with the patient and he is in agreement.  CODE STATUS: FULL  TOTAL TIME TAKING CARE OF THIS PATIENT: 22 minutes.     POSSIBLE D/C tomorrow, DEPENDING ON CLINICAL CONDITION.   Trynity Skousen M.D on 08/21/2015 at 12:26 PM  Between 7am to 6pm - Pager - [phone] After 6pm go to [email or web address] - password [email or web address] [facility]  Office  [phone]  CC: Primary care physician; Kirk Ruths., MD  Note: This dictation was prepared with Dragon dictation along with smaller phrase technology. Any transcriptional errors that result from this process are unintentional.

## 2015-08-21 NOTE — Consult Note (Signed)
  GI Inpatient Follow-up Note  Patient Identification: [name] is a 74 y.o. male  Subjective: Feels slightly better after blood transfusion yesterday. Less SOB. Hgb sl down. Requests physical therapy. Requests breathing treatment. Request more food.  Scheduled Inpatient Medications:  . atorvastatin  40 mg Oral QHS  . budesonide-formoterol  2 puff Inhalation BID  . citalopram  40 mg Oral Daily  . divalproex  500 mg Oral QHS  . ferrous sulfate  650 mg Oral TID WC  . furosemide  40 mg Oral BID  . Influenza vac split quadrivalent PF  0.5 mL Intramuscular Tomorrow-1000  . insulin aspart  0-5 Units Subcutaneous QHS  . insulin aspart  0-9 Units Subcutaneous TID WC  . insulin glargine  20 Units Subcutaneous BID  . lisinopril  5 mg Oral Daily  . metoprolol tartrate  25 mg Oral BID  . pantoprazole (PROTONIX) IV  40 mg Intravenous Q12H  . potassium chloride  40 mEq Oral Once  . sucralfate  1 g Oral TID WC & HS  . tiotropium  18 mcg Inhalation Daily  . Vitamin D (Ergocalciferol)  50,000 Units Oral Q7 days    Continuous Inpatient Infusions:     PRN Inpatient Medications:  acetaminophen **OR** acetaminophen, albuterol, ALPRAZolam, HYDROcodone-acetaminophen, ondansetron **OR** ondansetron (ZOFRAN) IV, zolpidem  Review of Systems: Constitutional: Weight is stable.  Eyes: No changes in vision. ENT: No oral lesions, sore throat.  GI: see HPI.  Heme/Lymph: No easy bruising.  CV: No chest pain.  GU: No hematuria.  Integumentary: No rashes.  Neuro: No headaches.  Psych: No depression/anxiety.  Endocrine: No heat/cold intolerance.  Allergic/Immunologic: No urticaria.  Resp: No cough, SOB.  Musculoskeletal: No joint swelling.    Physical Examination: BP 122/72 mmHg  Pulse 73  Temp(Src) 97.6 F (36.4 C) (Axillary)  Resp 18  Ht 6\' 2"  (1.88 m)  Wt 131.18 kg (289 lb 3.2 oz)  BMI 37.12 kg/m2  SpO2 98% Gen: NAD, alert and oriented x 4 HEENT: PEERLA, EOMI, Neck: supple, no JVD  or thyromegaly Chest: Decreased breath bilaterally, no wheezes, crackles, or other adventitious sounds CV: RRR, no m/g/c/r Abd: soft, NT, ND, +BS in all four quadrants; no HSM, guarding, ridigity, or rebound tenderness Ext: no edema, well perfused with 2+ pulses, Skin: no rash or lesions noted Lymph: no LAD  Data: Lab Results  Component Value Date   WBC 4.9 08/21/2015   HGB 8.0* 08/21/2015   HCT 25.2* 08/21/2015   MCV 93.8 08/21/2015   PLT 183 08/21/2015    Recent Labs Lab 08/20/15 0559 08/20/15 1817 08/21/15 0604  HGB 7.2* 8.5* 8.0*   Lab Results  Component Value Date   NA 141 08/21/2015   K 3.4* 08/21/2015   CL 103 08/21/2015   CO2 33* 08/21/2015   BUN 11 08/21/2015   CREATININE 1.05 08/21/2015   Lab Results  Component Value Date   ALT 9* 08/20/2015   AST 17 08/20/2015   ALKPHOS 88 08/20/2015   BILITOT 0.4 08/20/2015    Recent Labs Lab 08/20/15 0024  INR 1.02   Assessment/Plan: [name] is a 74 y.o. male with GI bleeding. No active bleeding right now.  Recommendations: Advance diet. Please order breathing treatment and PT. Will have Dr. Allen Norris check on pt over the weekend. Thanks.  Please call with questions or concerns.  Beauregard Jarrells, Lupita Dawn, MD

## 2015-08-21 NOTE — Progress Notes (Signed)
Pt is alert and oriented x 4, c/o r axilla pain improved with tylenol. Pt is up to bsc, bm throughout shift, hgb at 8.0, continues on 4 L oxygen, physical therapy consult entered. Dr. [name] consulted with patient and will not perform endo at this time, diet advanced to regular, denies n/v, tolerating diet.   Pt showed signs of aggression and reported that previous staff has made his so mad "that he could slap staff" , discussed with patient consequences of aggression and patient reports that he "did not mean it like that", pt became agitated throughout conversation and was administered xanax at patient request. Patient easily aggravated and intimidating at times.

## 2015-08-22 LAB — GLUCOSE, CAPILLARY
GLUCOSE-CAPILLARY: 181 mg/dL — AB (ref 65–99)
Glucose-Capillary: 151 mg/dL — ABNORMAL HIGH (ref 65–99)

## 2015-08-22 LAB — CBC
HEMATOCRIT: 27 % — AB (ref 40.0–52.0)
Hemoglobin: 8.7 g/dL — ABNORMAL LOW (ref 13.0–18.0)
MCH: 30.3 pg (ref 26.0–34.0)
MCHC: 32.1 g/dL (ref 32.0–36.0)
MCV: 94.3 fL (ref 80.0–100.0)
Platelets: 202 10*3/uL (ref 150–440)
RBC: 2.87 MIL/uL — AB (ref 4.40–5.90)
RDW: 17.8 % — ABNORMAL HIGH (ref 11.5–14.5)
WBC: 4.7 10*3/uL (ref 3.8–10.6)

## 2015-08-22 NOTE — Care Management Note (Signed)
Case Management Note  Patient Details  Name: JIGAR ZIELKE MRN: [id number] Date of Birth: 1941/06/01  Subjective/Objective:    Discharge information faxed and called to [name] at [facility] requesting resumption of care for RN, PT, Nurse Aid.                 Action/Plan:   Expected Discharge Date:                  Expected Discharge Plan:     In-House Referral:     Discharge planning Services     Post Acute Care Choice:    Choice offered to:     DME Arranged:    DME Agency:     HH Arranged:    [place] Agency:     Status of Service:     Medicare Important Message Given:    Date Medicare IM Given:    Medicare IM give by:    Date Additional Medicare IM Given:    Additional Medicare Important Message give by:     If discussed at [place] of Stay Meetings, dates discussed:    Additional Comments:  Kimberlynn Lumbra A, RN 08/22/2015, 10:28 AM

## 2015-08-22 NOTE — Discharge Summary (Signed)
[place] at [place] NAME: [name]    MR#:  [id number]  DATE OF BIRTH:  1941-05-25  DATE OF ADMISSION:  08/19/2015 ADMITTING PHYSICIAN: Lytle Butte, MD  DATE OF DISCHARGE: 08/22/2015   PRIMARY CARE PHYSICIAN: Kirk Ruths., MD    ADMISSION DIAGNOSIS:  sob  DISCHARGE DIAGNOSIS:  Active Problems:   Anemia   Respiratory insufficiency   GI bleed   Upper GI bleed   SECONDARY DIAGNOSIS:   Past Medical History  Diagnosis Date  . Allergic rhinitis, cause unspecified   . Hyperlipidemia   . CAD (coronary artery disease)     a. 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  . Essential hypertension   . COPD (chronic obstructive pulmonary disease) ([place])     a. GOLD stage IV, started home O2. Severe bullous disease of LUL. Prolonged intubation after surgeries due to COPD.  Marland Kitchen AAA (abdominal aortic aneurysm) ([place])     a. 12/2008 s/p 7cm, endovascular repair with coiling right hypogastric artery   . Recurrent Microcytic Anemia     a. presumed chronic GI blood loss.  . Memory loss   . Type II diabetes mellitus ([place])   . Chronic diastolic CHF (congestive heart failure) ([place])     a. 04/2015 Echo: EF 55-60%, no rwma, Gr 1 DD, mild AI.  Marland Kitchen GI bleed requiring more than 4 units of blood in 24 hours, ICU, or surgery     a. Hx bleeding gastric polyps, cecal & sigmoid AVMS s/p APC 03/30/14  . AVM (arteriovenous malformation) of colon with hemorrhage   . Multiple gastric polyps   . Diverticulosis   . Essential hypertension 08/18/2009    Qualifier: Diagnosis of  By: Doy Mince LPN, Megan    . Anxiety state 09/10/2013  . Depression 01/14/2013  . Vitamin D deficiency 08/10/2014  . Leucocytosis 12/04/2013  . Insomnia 08/10/2014  . Bipolar 1 disorder, mixed, moderate ([place]) 04/16/2015  . Morbid obesity [facility])     HOSPITAL COURSE:  74 year old male with a history of GI bleed who presents with upper GI bleed.  1.  Upper GI bleed: He had recent EGD and has known AV malformations. Patient received 1 unit of PRBCs. His hemoglobin is at 8.7 today. GI is not recommending another EGD at this time unless he shows signs of active bleeding.  2. COPD: Patient does not appear to have acute exacerbation. Continue Spiriva and Symbicort. Continue 4 L of oxygen which is his baseline.  3. Hyperlipidemia: Continue Lipitor  4. Type 2 diabetes: Continue Lantus, sliding scale insulin and ADA diet.   5. Depression and anxiety: Continue Celexa and Xanax   DISCHARGE CONDITIONS AND DIET:  Home  Stable condition on a diabetic diet  CONSULTS OBTAINED:  Treatment Team:  Lytle Butte, MD [name], MD  DRUG ALLERGIES:   Allergies  Allergen Reactions  . Penicillins Anaphylaxis, Hives and Other (See Comments)    Pt is unable to answer additional questions about this medication.    . Demerol [Meperidine] Other (See Comments)    Reaction:  Hallucinations    . Dilaudid [Hydromorphone Hcl] Other (See Comments)    Reaction:  Hallucinations   . Morphine And Related Nausea Only and Other (See Comments)    Hallucinations, rash, shortness of breath    DISCHARGE MEDICATIONS:   Current Discharge Medication List    CONTINUE these medications which have NOT CHANGED   Details  acetaminophen (TYLENOL)  325 MG tablet Take 650 mg by mouth every 6 (six) hours as needed for mild pain or fever.    albuterol (PROVENTIL HFA;VENTOLIN HFA) 108 (90 BASE) MCG/ACT inhaler Inhale 2 puffs into the lungs every 6 (six) hours as needed for wheezing or shortness of breath. Qty: 1 Inhaler, Refills: 2    ALPRAZolam (XANAX) 0.5 MG tablet Take 1 tablet (0.5 mg total) by mouth 3 (three) times daily as needed for anxiety (for shortness of breath). Qty: 60 tablet, Refills: 0    atorvastatin (LIPITOR) 40 MG tablet Take 40 mg by mouth at bedtime.    budesonide-formoterol (SYMBICORT) 160-4.5 MCG/ACT inhaler Inhale 2 puffs into the lungs 2 (two)  times daily. Qty: 1 Inhaler, Refills: 2    citalopram (CELEXA) 40 MG tablet Take 40 mg by mouth daily.    divalproex (DEPAKOTE) 500 MG DR tablet Take 1 tablet (500 mg total) by mouth at bedtime. Qty: 30 tablet, Refills: 0    docusate sodium (COLACE) 100 MG capsule Take 1 capsule (100 mg total) by mouth 2 (two) times daily. Qty: 60 capsule, Refills: 0    ferrous sulfate 325 (65 FE) MG tablet Take 650 mg by mouth 3 (three) times daily with meals.    furosemide (LASIX) 40 MG tablet Take 1 tablet (40 mg total) by mouth 2 (two) times daily. Qty: 60 tablet, Refills: 0    HYDROcodone-acetaminophen (NORCO/VICODIN) 5-325 MG per tablet Take 1 tablet by mouth every 6 (six) hours as needed for moderate pain.    insulin aspart (NOVOLOG) 100 UNIT/ML injection Inject 10 Units into the skin 3 (three) times daily with meals. Qty: 10 mL, Refills: 11    insulin glargine (LANTUS) 100 UNIT/ML injection Inject 0.2 mLs (20 Units total) into the skin 2 (two) times daily. Qty: 10 mL, Refills: 11    lisinopril (PRINIVIL,ZESTRIL) 5 MG tablet Take 1 tablet (5 mg total) by mouth daily. Qty: 30 tablet, Refills: 0    metoprolol tartrate (LOPRESSOR) 25 MG tablet Take 25 mg by mouth 2 (two) times daily.    pantoprazole (PROTONIX) 40 MG tablet Take 1 tablet (40 mg total) by mouth 2 (two) times daily. Qty: 60 tablet, Refills: 0    potassium chloride (K-DUR,KLOR-CON) 10 MEQ tablet Take 10 mEq by mouth daily.    sucralfate (CARAFATE) 1 G tablet Take 1 g by mouth 4 (four) times daily -  with meals and at bedtime.    tiotropium (SPIRIVA) 18 MCG inhalation capsule Place 1 capsule (18 mcg total) into inhaler and inhale daily. Qty: 30 capsule, Refills: 0    Vitamin D, Ergocalciferol, (DRISDOL) 50000 UNITS CAPS capsule Take 1 capsule (50,000 Units total) by mouth every 7 (seven) days. Pt takes on Monday. Qty: 12 capsule, Refills: 0    zolpidem (AMBIEN) 5 MG tablet Take 5 mg by mouth at bedtime as needed for sleep.               Today   CHIEF COMPLAINT:  Patient is doing well this time. Patient hasn't complaints.   VITAL SIGNS:  Blood pressure 114/71, pulse 88, temperature 97.7 F (36.5 C), temperature source Oral, resp. rate 18, height 6\' 2"  (1.88 m), weight 131.18 kg (289 lb 3.2 oz), SpO2 98 %.   REVIEW OF SYSTEMS:  Review of Systems  Constitutional: Negative for fever, chills and malaise/fatigue.  HENT: Negative for sore throat.   Eyes: Negative for blurred vision.  Respiratory: Negative for cough, hemoptysis, shortness of breath and wheezing.  Cardiovascular: Negative for chest pain, palpitations and leg swelling.  Gastrointestinal: Negative for nausea, vomiting, abdominal pain, diarrhea and blood in stool.  Genitourinary: Negative for dysuria.  Musculoskeletal: Negative for back pain.  Neurological: Negative for dizziness, tremors and headaches.  Endo/Heme/Allergies: Does not bruise/bleed easily.     PHYSICAL EXAMINATION:  GENERAL:  74 y.o.-year-old patient lying in the bed with no acute distress.  NECK:  Supple, no jugular venous distention. No thyroid enlargement, no tenderness.  LUNGS: Normal breath sounds bilaterally, no wheezing, rales,rhonchi  No use of accessory muscles of respiration.  CARDIOVASCULAR: S1, S2 normal. No murmurs, rubs, or gallops.  ABDOMEN: Soft, non-tender, non-distended. Bowel sounds present. No organomegaly or mass.  EXTREMITIES: No pedal edema, cyanosis, or clubbing.  PSYCHIATRIC: The patient is alert and oriented x 3.  SKIN: No obvious rash, lesion, or ulcer.   DATA REVIEW:   CBC  Recent Labs Lab 08/22/15 0346  WBC 4.7  HGB 8.7*  HCT 27.0*  PLT 202    Chemistries   Recent Labs Lab 08/20/15 0024 08/21/15 0604  NA 139 141  K 3.4* 3.4*  CL 103 103  CO2 30 33*  GLUCOSE 284* 142*  BUN 21* 11  CREATININE 1.25* 1.05  CALCIUM 8.7* 8.3*  AST 17  --   ALT 9*  --   ALKPHOS 88  --   BILITOT 0.4  --     Cardiac Enzymes  Recent  Labs Lab 08/20/15 0024  TROPONINI 0.03    Microbiology Results  @[email or web address] @  RADIOLOGY:  No results found.    Management plans discussed with the patient and he is in agreement. Stable for discharge home  Patient should follow up with PCP in one week  CODE STATUS:     Code Status Orders        Start     Ordered   08/20/15 0442  Full code   Continuous     08/20/15 0441      TOTAL TIME TAKING CARE OF THIS PATIENT: 30 minutes.    Note: This dictation was prepared with Dragon dictation along with smaller phrase technology. Any transcriptional errors that result from this process are unintentional.  Inioluwa Boulay M.D on 08/22/2015 at 11:22 AM  Between 7am to 6pm - Pager - [phone] After 6pm go to [email or web address] - password [email or web address] [facility]  Office  [phone]  CC: Primary care physician; Kirk Ruths., MD

## 2015-09-17 ENCOUNTER — Encounter: Payer: Self-pay | Admitting: Pulmonary Disease

## 2015-09-17 ENCOUNTER — Ambulatory Visit (INDEPENDENT_AMBULATORY_CARE_PROVIDER_SITE_OTHER): Payer: Medicare HMO | Admitting: Pulmonary Disease

## 2015-09-17 VITALS — BP 118/62 | HR 106 | Ht 75.0 in | Wt 268.0 lb

## 2015-09-17 DIAGNOSIS — J439 Emphysema, unspecified: Secondary | ICD-10-CM

## 2015-09-17 DIAGNOSIS — Z23 Encounter for immunization: Secondary | ICD-10-CM

## 2015-09-17 NOTE — Progress Notes (Signed)
Subjective:    Patient ID: [name], male    DOB: [birth year], 74 y.o.   MRN: [id number]  Synopsis: Severe COPD per FEV1 44% in 2011.  Quit smoking in early 2011 after long smoking history.  Started on O2 in 2013 with chronic GI bleeding.  HPI Chief Complaint  Patient presents with  . Follow-up    1 year rov.  c/o DOE, occasional prod cough in a.m.   CAT score 34.   Karma has been struggling lately with his anemia and weakness. He has been getting more short of breath lately and has been getting weaker. He would like to have a physical therapist see him to help get stronger. He has a home health nurse coming out to the house lately. He says that his new PCP (whom he really likes) is going to get him to see a diabetic specialist podiatrist for his neuropathy. No flare ups of his COPD, no bronchitis, no pneumonia. He has had his flu shot. He is still taking Spiriva and Symbicort, no problems with them.  He takes them regularly.    Past Medical History  Diagnosis Date  . Allergic rhinitis, cause unspecified   . Hyperlipidemia   . CAD (coronary artery disease)     a. 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  . Essential hypertension   . COPD (chronic obstructive pulmonary disease) ([place])     a. GOLD stage IV, started home O2. Severe bullous disease of LUL. Prolonged intubation after surgeries due to COPD.  Marland Kitchen AAA (abdominal aortic aneurysm) ([place])     a. 12/2008 s/p 7cm, endovascular repair with coiling right hypogastric artery   . Recurrent Microcytic Anemia     a. presumed chronic GI blood loss.  . Memory loss   . Type II diabetes mellitus ([place])   . Chronic diastolic CHF (congestive heart failure) ([place])     a. 04/2015 Echo: EF 55-60%, no rwma, Gr 1 DD, mild AI.  Marland Kitchen GI bleed requiring more than 4 units of blood in 24 hours, ICU, or surgery     a. Hx bleeding gastric polyps, cecal & sigmoid AVMS s/p APC 03/30/14  . AVM (arteriovenous malformation) of  colon with hemorrhage   . Multiple gastric polyps   . Diverticulosis   . Essential hypertension 08/18/2009    Qualifier: Diagnosis of  By: Doy Mince LPN, Megan    . Anxiety state 09/10/2013  . Depression 01/14/2013  . Vitamin D deficiency 08/10/2014  . Leucocytosis 12/04/2013  . Insomnia 08/10/2014  . Bipolar 1 disorder, mixed, moderate ([place]) 04/16/2015  . Morbid obesity ([place])      Review of Systems  Constitutional: Negative for fever, chills, activity change, appetite change and unexpected weight change.  HENT: Negative for congestion, postnasal drip, rhinorrhea and sneezing.   Respiratory: Positive for shortness of breath. Negative for cough and choking.   Cardiovascular: Negative for chest pain and leg swelling.       Objective:   Physical Exam  Filed Vitals:   09/17/15 1437  BP: 118/62  Pulse: 106  Height: 6\' 3"  (1.905 m)  Weight: 268 lb (121.564 kg)  SpO2: 94%  O2 2 L  Gen: chronically ill appearing, no acute distress HEENT: NCAT, EOMi, OP clear, neck supple without masses PULM: No wheezing, CTA today CV: RRR, no mgr, no JVD AB: BS+, soft, nontender, no hsm Ext: warm, no edema, no clubbing, no cyanosis  2010 simple spirometry with FEV1 of 1.41L 44%  predicted clear obstruction and flow volume loop 2014 simple spirometry FEV1 1.3 L, clear obstruction     Assessment & Plan:   COPD (chronic obstructive pulmonary disease) ([place]) Despite severe COPD and severe deconditioning he has been relatively stable from a respiratory standpoint recently. He has not had any recent exacerbations. He remains compliant with his medications. His flu shot is up-to-date.  Plan: Pulmonary rehabilitation referral considering profound deconditioning and dyspnea Have nor vaccine today Continue Symbicort and Spiriva  Chronic hypoxemic respiratory failure (HCC) Continue 2 L of oxygen continuously  > 50% of time spent face to face in a 25 minute visit  Updated Medication List Outpatient  Encounter Prescriptions as of 09/17/2015  Medication Sig  . acetaminophen (TYLENOL) 325 MG tablet Take 650 mg by mouth every 6 (six) hours as needed for mild pain or fever.  Marland Kitchen albuterol (PROVENTIL HFA;VENTOLIN HFA) 108 (90 BASE) MCG/ACT inhaler Inhale 2 puffs into the lungs every 6 (six) hours as needed for wheezing or shortness of breath.  . ALPRAZolam (XANAX) 0.5 MG tablet Take 1 tablet (0.5 mg total) by mouth 3 (three) times daily as needed for anxiety (for shortness of breath). (Patient taking differently: Take 0.5 mg by mouth 3 (three) times daily as needed for anxiety (or for shortness of breath). )  . atorvastatin (LIPITOR) 40 MG tablet Take 40 mg by mouth at bedtime.  . budesonide-formoterol (SYMBICORT) 160-4.5 MCG/ACT inhaler Inhale 2 puffs into the lungs 2 (two) times daily.  . citalopram (CELEXA) 40 MG tablet Take 40 mg by mouth daily.  . divalproex (DEPAKOTE) 500 MG DR tablet Take 1 tablet (500 mg total) by mouth at bedtime.  . docusate sodium (COLACE) 100 MG capsule Take 1 capsule (100 mg total) by mouth 2 (two) times daily.  . ferrous sulfate 325 (65 FE) MG tablet Take 650 mg by mouth 3 (three) times daily with meals.  . furosemide (LASIX) 40 MG tablet Take 1 tablet (40 mg total) by mouth 2 (two) times daily.  Marland Kitchen HYDROcodone-acetaminophen (NORCO/VICODIN) 5-325 MG per tablet Take 1 tablet by mouth every 6 (six) hours as needed for moderate pain.  Marland Kitchen insulin aspart (NOVOLOG) 100 UNIT/ML injection Inject 10 Units into the skin 3 (three) times daily with meals.  . insulin glargine (LANTUS) 100 UNIT/ML injection Inject 0.2 mLs (20 Units total) into the skin 2 (two) times daily.  Marland Kitchen lisinopril (PRINIVIL,ZESTRIL) 5 MG tablet Take 1 tablet (5 mg total) by mouth daily.  . metoprolol tartrate (LOPRESSOR) 25 MG tablet Take 25 mg by mouth 2 (two) times daily.  . pantoprazole (PROTONIX) 40 MG tablet Take 1 tablet (40 mg total) by mouth 2 (two) times daily.  . potassium chloride (K-DUR,KLOR-CON) 10 MEQ  tablet Take 10 mEq by mouth daily.  . sucralfate (CARAFATE) 1 G tablet Take 1 g by mouth 4 (four) times daily -  with meals and at bedtime.  Marland Kitchen tiotropium (SPIRIVA) 18 MCG inhalation capsule Place 1 capsule (18 mcg total) into inhaler and inhale daily.  . Vitamin D, Ergocalciferol, (DRISDOL) 50000 UNITS CAPS capsule Take 1 capsule (50,000 Units total) by mouth every 7 (seven) days. Pt takes on Monday.  . zolpidem (AMBIEN) 5 MG tablet Take 5 mg by mouth at bedtime as needed for sleep.   No facility-administered encounter medications on file as of 09/17/2015.

## 2015-09-17 NOTE — Patient Instructions (Signed)
Keep taking your medications as you are doing Keep using the oxygen as you are doing We will refer you to pulmonary rehab in [facility] We will have our lung cancer screening nurse practioner contact you We will see you back in 6 months or sooner if needed

## 2015-09-17 NOTE — Assessment & Plan Note (Signed)
Continue 2 L of oxygen continuously. 

## 2015-09-17 NOTE — Assessment & Plan Note (Signed)
Despite severe COPD and severe deconditioning he has been relatively stable from a respiratory standpoint recently. He has not had any recent exacerbations. He remains compliant with his medications. His flu shot is up-to-date.  Plan: Pulmonary rehabilitation referral considering profound deconditioning and dyspnea Have nor vaccine today Continue Symbicort and Spiriva

## 2015-09-21 ENCOUNTER — Telehealth: Payer: Self-pay | Admitting: Pulmonary Disease

## 2015-09-21 NOTE — Telephone Encounter (Signed)
Spoke with pt, states that he wishes to attend a candlelight love feast at his church.  States this service is about 90 minutes long.  Pt states that he cannot wear his 02 into the church for this service d/t all of the candles being lit (church protocol).  At last ov pt was on 2lpm continuous 24/7.  Pt states that he can wear his 02 to church and leave his tank in the car during the service, and his wife get it for him before returning to the car.  Pt does not have a pulse oximeter at home to monitor sp02 readings.  Pt wants to know if he would be ok to attend this church service.    BQ please advise.  Thanks.

## 2015-09-22 NOTE — Telephone Encounter (Signed)
Pt's wife is aware that BQ is fine with him doing this. Nothing further was needed at this time.

## 2015-09-22 NOTE — Telephone Encounter (Signed)
Yes - that would be fine

## 2015-09-29 ENCOUNTER — Other Ambulatory Visit: Payer: Self-pay | Admitting: Pulmonary Disease

## 2015-10-02 ENCOUNTER — Encounter: Payer: Self-pay | Admitting: Acute Care

## 2015-10-06 ENCOUNTER — Encounter: Payer: Self-pay | Admitting: Sports Medicine

## 2015-10-06 ENCOUNTER — Ambulatory Visit (INDEPENDENT_AMBULATORY_CARE_PROVIDER_SITE_OTHER): Payer: Medicare HMO | Admitting: Sports Medicine

## 2015-10-06 DIAGNOSIS — E1142 Type 2 diabetes mellitus with diabetic polyneuropathy: Secondary | ICD-10-CM

## 2015-10-06 DIAGNOSIS — B351 Tinea unguium: Secondary | ICD-10-CM | POA: Diagnosis not present

## 2015-10-06 DIAGNOSIS — M79673 Pain in unspecified foot: Secondary | ICD-10-CM | POA: Diagnosis not present

## 2015-10-06 MED ORDER — GABAPENTIN 100 MG PO CAPS: 100.0000 mg | ORAL_CAPSULE | Freq: Three times a day (TID) | ORAL | Status: DC

## 2015-10-06 MED ORDER — LIDOCAINE 5 % EX OINT: 1.0000 "application " | TOPICAL_OINTMENT | CUTANEOUS | Status: DC | PRN

## 2015-10-06 NOTE — Progress Notes (Deleted)
   Subjective:    Patient ID: [name], male    DOB: [birth year], 74 y.o.   MRN: [id number]  HPI    Review of Systems  Constitutional: Positive for activity change, appetite change and fatigue.  Eyes: Positive for itching and visual disturbance.  Respiratory: Positive for cough, chest tightness and shortness of breath.   Cardiovascular: Positive for chest pain and leg swelling.       Calf pain when walking   Musculoskeletal: Positive for back pain, joint swelling, arthralgias and gait problem.  Skin: Positive for color change and rash.  Neurological: Positive for dizziness, weakness, light-headedness and numbness.  Psychiatric/Behavioral: Positive for behavioral problems and confusion.  All other systems reviewed and are negative.      Objective:   Physical Exam        Assessment & Plan:

## 2015-10-06 NOTE — Progress Notes (Signed)
Patient ID: RUFORD IMBURGIA, male   DOB: 30-Sep-1941, 74 y.o.   MRN: [id number] Subjective: [name] is a 74 y.o. male patient with history of type 2 diabetes who presents to office today complaining of long, painful nails  while ambulating in shoes; unable to trim and also severe tingling, burning, numbness that is more constant over the last 3 months. Patient states that the glucose reading this morning was 150mg /dl. Patient denies any new changes in medication or new problems. Patient denies any new cramping, numbness, burning or tingling in the legs.  Review of Systems  Constitutional: Positive for activity change, appetite change and fatigue.  Eyes: Positive for itching and visual disturbance.  Respiratory: Positive for cough, chest tightness and shortness of breath.   Cardiovascular: Positive for chest pain and leg swelling.       Calf pain when walking   Musculoskeletal: Positive for back pain, joint swelling, arthralgias and gait problem.  Skin: Positive for color change and rash.  Neurological: Positive for dizziness, weakness, light-headedness and numbness.  Psychiatric/Behavioral: Positive for behavioral problems and confusion.  All other systems reviewed and are negative.  Patient Active Problem List   Diagnosis Date Noted  . Respiratory insufficiency 08/20/2015  . GI bleed 08/20/2015  . Upper GI bleed 08/20/2015  . Low back pain 07/02/2015  . Bipolar disorder, in partial remission, most recent episode mixed ([place])   . Gastric AVM   . AVM (arteriovenous malformation) of duodenum, acquired with hemorrhage   . Bipolar I disorder, most recent episode mixed ([place]) 04/17/2015  . Bipolar 1 disorder, mixed, moderate ([place]) 04/16/2015  . Major depressive disorder, recurrent, severe without psychotic features ([place])   . Severe major depression without psychotic features ([place]) 04/14/2015  . Supplemental oxygen dependent 11/30/2014  . Iron deficiency anemia due to chronic blood loss  11/25/2014  . Vitamin D deficiency 08/10/2014  . Insomnia 08/10/2014  . Polypharmacy 08/10/2014  . Chronic pain syndrome 08/10/2014  . GI bleed requiring more than 4 units of blood in 24 hours, ICU, or surgery   . 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 Aftercare following surgery of the circulatory system, [place] 11/04/2013  . Multiple gastric polyps 09/10/2013  . Anxiety state 09/10/2013  . 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 Chronic hypoxemic respiratory failure ([place]) 05/21/2013  . Depression 01/14/2013  . Fatigue 11/06/2012  . Chronic GI bleeding 05/17/2012  . CAD (coronary artery disease) 04/17/2012  . Diastolic HF (heart failure) ([place]) 03/27/2012  . Anemia   . COPD (chronic obstructive pulmonary disease) ([place]) 09/13/2011  . CERUMEN IMPACTION, BILATERAL 11/11/2010  . Hyperlipidemia 08/18/2009  . Essential hypertension 08/18/2009  . ALLERGIC RHINITIS 08/18/2009  . AAA (abdominal aortic aneurysm) ([place]) 12/15/2008   Current Outpatient Prescriptions on File Prior to Visit  Medication Sig Dispense Refill  . acetaminophen (TYLENOL) 325 MG tablet Take 650 mg by mouth every 6 (six) hours as needed for mild pain or fever.    Marland Kitchen albuterol (PROVENTIL HFA;VENTOLIN HFA) 108 (90 BASE) MCG/ACT inhaler Inhale 2 puffs into the lungs every 6 (six) hours as needed for wheezing or shortness of breath. 1 Inhaler 2  . ALPRAZolam (XANAX) 0.5 MG tablet Take 1 tablet (0.5 mg total) by mouth 3 (three) times daily as needed for anxiety (for shortness of breath). (Patient taking differently: Take 0.5 mg by mouth 3 (three) times daily as needed for anxiety (or for shortness of breath). ) 60 tablet 0  .  atorvastatin (LIPITOR) 40 MG tablet Take 40 mg by mouth at bedtime.    . budesonide-formoterol (SYMBICORT) 160-4.5 MCG/ACT inhaler Inhale 2 puffs into the lungs 2 (two) times daily. 1 Inhaler 2  . citalopram (CELEXA) 40 MG tablet Take 40 mg by mouth  daily.    . ferrous sulfate 325 (65 FE) MG tablet Take 650 mg by mouth 3 (three) times daily with meals.    Marland Kitchen HYDROcodone-acetaminophen (NORCO/VICODIN) 5-325 MG per tablet Take 1 tablet by mouth every 6 (six) hours as needed for moderate pain.    Marland Kitchen insulin aspart (NOVOLOG) 100 UNIT/ML injection Inject 10 Units into the skin 3 (three) times daily with meals. 10 mL 11  . insulin glargine (LANTUS) 100 UNIT/ML injection Inject 0.2 mLs (20 Units total) into the skin 2 (two) times daily. 10 mL 11  . metoprolol tartrate (LOPRESSOR) 25 MG tablet Take 25 mg by mouth 2 (two) times daily.    . pantoprazole (PROTONIX) 40 MG tablet Take 1 tablet (40 mg total) by mouth 2 (two) times daily. 60 tablet 0  . SPIRIVA HANDIHALER 18 MCG inhalation capsule PLACE 1 CAPSULE (18 MCG TOTAL) INTO INHALER AND INHALE DAILY. 30 capsule 5  . sucralfate (CARAFATE) 1 G tablet Take 1 g by mouth 4 (four) times daily -  with meals and at bedtime.    Marland Kitchen zolpidem (AMBIEN) 5 MG tablet Take 5 mg by mouth at bedtime as needed for sleep.     No current facility-administered medications on file prior to visit.   Allergies  Allergen Reactions  . Penicillins Anaphylaxis, Hives and Other (See Comments)    Pt is unable to answer additional questions about this medication.    . Demerol [Meperidine] Other (See Comments)    Reaction:  Hallucinations    . Dilaudid [Hydromorphone Hcl] Other (See Comments)    Reaction:  Hallucinations   . Levofloxacin Other (See Comments)    unknown  . Morphine And Related Nausea Only and Other (See Comments)    Hallucinations, rash, shortness of breath     Objective: General: Patient is awake, alert, and oriented x 3 and in no acute distress.  Integument: Skin is warm, dry and supple bilateral. Nails are tender, mildly elongated, thickened and  dystrophic with subungual debris, consistent with onychomycosis, 1-5 bilateral. No signs of infection. No open lesions or preulcerative lesions present  bilateral. Remaining integument unremarkable.  Vasculature:  Dorsalis Pedis pulse 2/4 bilateral. Posterior Tibial pulse  1/4 bilateral.  Capillary fill time <3 sec 1-5 bilateral. Scant hair growth to the level of the digits. Temperature gradient within normal limits. No varicosities present bilateral. No edema present bilateral.   Neurology: The patient has absent  sensation measured with a 5.07/10g Semmes Weinstein Monofilament at all pedal sites bilateral . Vibratory sensation absent to malleolus bilateral with tuning fork. No Babinski sign present bilateral.   Musculoskeletal: No gross pedal deformities noted bilateral. Muscular strength 5/5 in all lower extremity muscular groups bilateral without pain or limitation on range of motion . No tenderness with calf compression bilateral.  Assessment and Plan: Problem List Items Addressed This Visit    None    Visit Diagnoses    Dermatophytosis of nail    -  Primary    Diabetic polyneuropathy associated with type 2 diabetes mellitus (HCC)        Relevant Medications    gabapentin (NEURONTIN) 100 MG capsule    lidocaine (XYLOCAINE) 5 % ointment       -  Examined patient. -Discussed and educated patient on diabetic foot care, especially with  regards to the vascular, neurological and musculoskeletal systems.  -Stressed the importance of good glycemic control and the detriment of not  controlling glucose levels in relation to the foot. -Strongly advised patient not to cut or peel off his nails -Mechanically debrided all nails 1-5 bilateral using sterile nail nipper and filed with dremel without incident  -Rx Lidocaine to use for pain relief to toes and Gabapentin 300mg  daily. If no relief will refer to neurology for assistance with neuropathic pain management -Answered all patient questions -Patient to return as needed or in 3 months for at risk foot care -Patient advised to call the office if any problems or questions arise in the  meantime.  Landis Martins, DPM

## 2015-10-06 NOTE — Patient Instructions (Signed)
Diabetes and Foot Care Diabetes may cause you to have problems because of poor blood supply (circulation) to your feet and legs. This may cause the skin on your feet to become thinner, break easier, and heal more slowly. Your skin may become dry, and the skin may peel and crack. You may also have nerve damage in your legs and feet causing decreased feeling in them. You may not notice minor injuries to your feet that could lead to infections or more serious problems. Taking care of your feet is one of the most important things you can do for yourself.  HOME CARE INSTRUCTIONS  Wear shoes at all times, even in the house. Do not go barefoot. Bare feet are easily injured.  Check your feet daily for blisters, cuts, and redness. If you cannot see the bottom of your feet, use a mirror or ask someone for help.  Wash your feet with warm water (do not use hot water) and mild soap. Then pat your feet and the areas between your toes until they are completely dry. Do not soak your feet as this can dry your skin.  Apply a moisturizing lotion or petroleum jelly (that does not contain alcohol and is unscented) to the skin on your feet and to dry, brittle toenails. Do not apply lotion between your toes.  Trim your toenails straight across. Do not dig under them or around the cuticle. File the edges of your nails with an emery board or nail file.  Do not cut corns or calluses or try to remove them with medicine.  Wear clean socks or stockings every day. Make sure they are not too tight. Do not wear knee-high stockings since they may decrease blood flow to your legs.  Wear shoes that fit properly and have enough cushioning. To break in new shoes, wear them for just a few hours a day. This prevents you from injuring your feet. Always look in your shoes before you put them on to be sure there are no objects inside.  Do not cross your legs. This may decrease the blood flow to your feet.  If you find a minor scrape,  cut, or break in the skin on your feet, keep it and the skin around it clean and dry. These areas may be cleansed with mild soap and water. Do not cleanse the area with peroxide, alcohol, or iodine.  When you remove an adhesive bandage, be sure not to damage the skin around it.  If you have a wound, look at it several times a day to make sure it is healing.  Do not use heating pads or hot water bottles. They may burn your skin. If you have lost feeling in your feet or legs, you may not know it is happening until it is too late.  Make sure your health care provider performs a complete foot exam at least annually or more often if you have foot problems. Report any cuts, sores, or bruises to your health care provider immediately. SEEK MEDICAL CARE IF:   You have an injury that is not healing.  You have cuts or breaks in the skin.  You have an ingrown nail.  You notice redness on your legs or feet.  You feel burning or tingling in your legs or feet.  You have pain or cramps in your legs and feet.  Your legs or feet are numb.  Your feet always feel cold. SEEK IMMEDIATE MEDICAL CARE IF:   There is increasing redness,   swelling, or pain in or around a wound.  There is a red line that goes up your leg.  Pus is coming from a wound.  You develop a fever or as directed by your health care provider.  You notice a bad smell coming from an ulcer or wound.   This information is not intended to replace advice given to you by your health care provider. Make sure you discuss any questions you have with your health care provider.   Document Released: 09/30/2000 Document Revised: 06/05/2013 Document Reviewed: 03/12/2013 Elsevier Interactive Patient Education 2016 [employer]  

## 2015-10-13 ENCOUNTER — Ambulatory Visit: Payer: Commercial Managed Care - HMO | Admitting: Gastroenterology

## 2015-10-18 DIAGNOSIS — J189 Pneumonia, unspecified organism: Secondary | ICD-10-CM

## 2015-10-18 HISTORY — DX: Pneumonia, unspecified organism: J18.9

## 2015-10-21 ENCOUNTER — Telehealth: Payer: Self-pay | Admitting: Pulmonary Disease

## 2015-10-21 MED ORDER — PREDNISONE 10 MG PO TABS: ORAL_TABLET | ORAL | Status: DC

## 2015-10-21 MED ORDER — DOXYCYCLINE MONOHYDRATE 100 MG PO CAPS: 100.0000 mg | ORAL_CAPSULE | Freq: Two times a day (BID) | ORAL | Status: DC

## 2015-10-21 NOTE — Telephone Encounter (Signed)
Spoke with the pt  He is c/o increased cough with clear sputum, wheezing, chest tightness and increased SOB for approx 1 wk  He states these symptoms have been esp worse over the past 48 hrs  He states has had "a little" fever  He has an upcoming appt 10/25/14 with Judson Roch for lung screening  He reports spouse has "the crud" and is currently being txed with doxy  No appts available today  Please advise, thanks! Allergies  Allergen Reactions  . Penicillins Anaphylaxis, Hives and Other (See Comments)    Pt is unable to answer additional questions about this medication.    . Demerol [Meperidine] Other (See Comments)    Reaction:  Hallucinations    . Dilaudid [Hydromorphone Hcl] Other (See Comments)    Reaction:  Hallucinations   . Levofloxacin Other (See Comments)    unknown  . Morphine And Related Nausea Only and Other (See Comments)    Hallucinations, rash, shortness of breath

## 2015-10-21 NOTE — Telephone Encounter (Signed)
Patient Returned call [phone]

## 2015-10-21 NOTE — Telephone Encounter (Signed)
Add prednisone to doxycycline: Take 40mg  po daily for 3 days, then take 30mg  po daily for 3 days, then take 20mg  po daily for two days, then take 10mg  po daily for 2 days  If no better, call

## 2015-10-21 NOTE — Telephone Encounter (Signed)
Left message for patient to call back  

## 2015-10-21 NOTE — Telephone Encounter (Signed)
Per BQ- call in doxy 100 mg bid x 7 days and pred taper  Spoke with spouse and notified her of this and rxs were sent

## 2015-10-22 ENCOUNTER — Encounter: Payer: Self-pay | Admitting: Acute Care

## 2015-10-25 ENCOUNTER — Inpatient Hospital Stay
Admission: EM | Admit: 2015-10-25 | Discharge: 2015-11-01 | DRG: 378 | Disposition: A | Discharge status: Home Health | Payer: Medicare HMO | Attending: Internal Medicine | Admitting: Internal Medicine

## 2015-10-25 ENCOUNTER — Encounter: Payer: Self-pay | Admitting: Emergency Medicine

## 2015-10-25 ENCOUNTER — Emergency Department: Payer: Medicare HMO

## 2015-10-25 DIAGNOSIS — R0602 Shortness of breath: Secondary | ICD-10-CM | POA: Diagnosis not present

## 2015-10-25 DIAGNOSIS — J441 Chronic obstructive pulmonary disease with (acute) exacerbation: Secondary | ICD-10-CM | POA: Diagnosis present

## 2015-10-25 DIAGNOSIS — Z833 Family history of diabetes mellitus: Secondary | ICD-10-CM

## 2015-10-25 DIAGNOSIS — Z794 Long term (current) use of insulin: Secondary | ICD-10-CM

## 2015-10-25 DIAGNOSIS — J9611 Chronic respiratory failure with hypoxia: Secondary | ICD-10-CM | POA: Diagnosis present

## 2015-10-25 DIAGNOSIS — F319 Bipolar disorder, unspecified: Secondary | ICD-10-CM | POA: Diagnosis present

## 2015-10-25 DIAGNOSIS — M545 Low back pain: Secondary | ICD-10-CM | POA: Diagnosis present

## 2015-10-25 DIAGNOSIS — D500 Iron deficiency anemia secondary to blood loss (chronic): Secondary | ICD-10-CM

## 2015-10-25 DIAGNOSIS — I2510 Atherosclerotic heart disease of native coronary artery without angina pectoris: Secondary | ICD-10-CM | POA: Diagnosis present

## 2015-10-25 DIAGNOSIS — K922 Gastrointestinal hemorrhage, unspecified: Secondary | ICD-10-CM | POA: Diagnosis present

## 2015-10-25 DIAGNOSIS — Z881 Allergy status to other antibiotic agents status: Secondary | ICD-10-CM

## 2015-10-25 DIAGNOSIS — Z885 Allergy status to narcotic agent status: Secondary | ICD-10-CM

## 2015-10-25 DIAGNOSIS — E876 Hypokalemia: Secondary | ICD-10-CM | POA: Diagnosis present

## 2015-10-25 DIAGNOSIS — Z8711 Personal history of peptic ulcer disease: Secondary | ICD-10-CM

## 2015-10-25 DIAGNOSIS — Z87891 Personal history of nicotine dependence: Secondary | ICD-10-CM

## 2015-10-25 DIAGNOSIS — J189 Pneumonia, unspecified organism: Secondary | ICD-10-CM

## 2015-10-25 DIAGNOSIS — Z6833 Body mass index (BMI) 33.0-33.9, adult: Secondary | ICD-10-CM

## 2015-10-25 DIAGNOSIS — I110 Hypertensive heart disease with heart failure: Secondary | ICD-10-CM | POA: Diagnosis present

## 2015-10-25 DIAGNOSIS — Z951 Presence of aortocoronary bypass graft: Secondary | ICD-10-CM

## 2015-10-25 DIAGNOSIS — Z8249 Family history of ischemic heart disease and other diseases of the circulatory system: Secondary | ICD-10-CM

## 2015-10-25 DIAGNOSIS — I252 Old myocardial infarction: Secondary | ICD-10-CM

## 2015-10-25 DIAGNOSIS — J439 Emphysema, unspecified: Secondary | ICD-10-CM

## 2015-10-25 DIAGNOSIS — I5032 Chronic diastolic (congestive) heart failure: Secondary | ICD-10-CM | POA: Diagnosis present

## 2015-10-25 DIAGNOSIS — Z9981 Dependence on supplemental oxygen: Secondary | ICD-10-CM

## 2015-10-25 DIAGNOSIS — Z825 Family history of asthma and other chronic lower respiratory diseases: Secondary | ICD-10-CM

## 2015-10-25 DIAGNOSIS — K449 Diaphragmatic hernia without obstruction or gangrene: Secondary | ICD-10-CM | POA: Diagnosis present

## 2015-10-25 DIAGNOSIS — G8929 Other chronic pain: Secondary | ICD-10-CM | POA: Diagnosis present

## 2015-10-25 DIAGNOSIS — E785 Hyperlipidemia, unspecified: Secondary | ICD-10-CM | POA: Diagnosis present

## 2015-10-25 DIAGNOSIS — Z79899 Other long term (current) drug therapy: Secondary | ICD-10-CM

## 2015-10-25 DIAGNOSIS — Z7951 Long term (current) use of inhaled steroids: Secondary | ICD-10-CM

## 2015-10-25 DIAGNOSIS — Z880 Allergy status to penicillin: Secondary | ICD-10-CM

## 2015-10-25 DIAGNOSIS — D62 Acute posthemorrhagic anemia: Secondary | ICD-10-CM | POA: Diagnosis present

## 2015-10-25 DIAGNOSIS — E669 Obesity, unspecified: Secondary | ICD-10-CM | POA: Diagnosis present

## 2015-10-25 DIAGNOSIS — E1142 Type 2 diabetes mellitus with diabetic polyneuropathy: Secondary | ICD-10-CM | POA: Diagnosis present

## 2015-10-25 LAB — PROTIME-INR
INR: 1.05
PROTHROMBIN TIME: 13.9 s (ref 11.4–15.0)

## 2015-10-25 NOTE — ED Notes (Signed)
Pt from home via ems with c/o SOB, pt states this has been going on for about 6 days pt states it has gotten to the point where he "couldn't take it any longer". Pt states when he gets this bad its normally because of low hemoglobin. Pt has chronic bleeding polyps that keeping coming back after EDG are done. Pt lives on [address] [place] at home.
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
## 2015-10-26 ENCOUNTER — Encounter: Payer: Medicare HMO | Admitting: Acute Care

## 2015-10-26 DIAGNOSIS — J9611 Chronic respiratory failure with hypoxia: Secondary | ICD-10-CM | POA: Diagnosis present

## 2015-10-26 DIAGNOSIS — Z825 Family history of asthma and other chronic lower respiratory diseases: Secondary | ICD-10-CM | POA: Diagnosis not present

## 2015-10-26 DIAGNOSIS — E1142 Type 2 diabetes mellitus with diabetic polyneuropathy: Secondary | ICD-10-CM | POA: Diagnosis present

## 2015-10-26 DIAGNOSIS — Z885 Allergy status to narcotic agent status: Secondary | ICD-10-CM | POA: Diagnosis not present

## 2015-10-26 DIAGNOSIS — Z7951 Long term (current) use of inhaled steroids: Secondary | ICD-10-CM | POA: Diagnosis not present

## 2015-10-26 DIAGNOSIS — E669 Obesity, unspecified: Secondary | ICD-10-CM | POA: Diagnosis present

## 2015-10-26 DIAGNOSIS — K922 Gastrointestinal hemorrhage, unspecified: Secondary | ICD-10-CM | POA: Diagnosis present

## 2015-10-26 DIAGNOSIS — I2510 Atherosclerotic heart disease of native coronary artery without angina pectoris: Secondary | ICD-10-CM | POA: Diagnosis present

## 2015-10-26 DIAGNOSIS — K449 Diaphragmatic hernia without obstruction or gangrene: Secondary | ICD-10-CM | POA: Diagnosis present

## 2015-10-26 DIAGNOSIS — Z794 Long term (current) use of insulin: Secondary | ICD-10-CM | POA: Diagnosis not present

## 2015-10-26 DIAGNOSIS — E876 Hypokalemia: Secondary | ICD-10-CM | POA: Diagnosis present

## 2015-10-26 DIAGNOSIS — F319 Bipolar disorder, unspecified: Secondary | ICD-10-CM | POA: Diagnosis present

## 2015-10-26 DIAGNOSIS — I5032 Chronic diastolic (congestive) heart failure: Secondary | ICD-10-CM | POA: Diagnosis present

## 2015-10-26 DIAGNOSIS — I110 Hypertensive heart disease with heart failure: Secondary | ICD-10-CM | POA: Diagnosis present

## 2015-10-26 DIAGNOSIS — Z881 Allergy status to other antibiotic agents status: Secondary | ICD-10-CM | POA: Diagnosis not present

## 2015-10-26 DIAGNOSIS — Z8711 Personal history of peptic ulcer disease: Secondary | ICD-10-CM | POA: Diagnosis not present

## 2015-10-26 DIAGNOSIS — Z8249 Family history of ischemic heart disease and other diseases of the circulatory system: Secondary | ICD-10-CM | POA: Diagnosis not present

## 2015-10-26 DIAGNOSIS — G8929 Other chronic pain: Secondary | ICD-10-CM | POA: Diagnosis present

## 2015-10-26 DIAGNOSIS — Z6833 Body mass index (BMI) 33.0-33.9, adult: Secondary | ICD-10-CM | POA: Diagnosis not present

## 2015-10-26 DIAGNOSIS — I252 Old myocardial infarction: Secondary | ICD-10-CM | POA: Diagnosis not present

## 2015-10-26 DIAGNOSIS — J441 Chronic obstructive pulmonary disease with (acute) exacerbation: Secondary | ICD-10-CM | POA: Diagnosis present

## 2015-10-26 DIAGNOSIS — Z79899 Other long term (current) drug therapy: Secondary | ICD-10-CM | POA: Diagnosis not present

## 2015-10-26 DIAGNOSIS — Z833 Family history of diabetes mellitus: Secondary | ICD-10-CM | POA: Diagnosis not present

## 2015-10-26 DIAGNOSIS — R0602 Shortness of breath: Secondary | ICD-10-CM | POA: Diagnosis present

## 2015-10-26 DIAGNOSIS — Z951 Presence of aortocoronary bypass graft: Secondary | ICD-10-CM | POA: Diagnosis not present

## 2015-10-26 DIAGNOSIS — Z87891 Personal history of nicotine dependence: Secondary | ICD-10-CM | POA: Diagnosis not present

## 2015-10-26 DIAGNOSIS — E785 Hyperlipidemia, unspecified: Secondary | ICD-10-CM | POA: Diagnosis present

## 2015-10-26 DIAGNOSIS — Z9981 Dependence on supplemental oxygen: Secondary | ICD-10-CM | POA: Diagnosis not present

## 2015-10-26 DIAGNOSIS — M545 Low back pain: Secondary | ICD-10-CM | POA: Diagnosis present

## 2015-10-26 DIAGNOSIS — Z880 Allergy status to penicillin: Secondary | ICD-10-CM | POA: Diagnosis not present

## 2015-10-26 DIAGNOSIS — D62 Acute posthemorrhagic anemia: Secondary | ICD-10-CM | POA: Diagnosis present

## 2015-10-26 LAB — BASIC METABOLIC PANEL
ANION GAP: 7 (ref 5–15)
BUN: 43 mg/dL — ABNORMAL HIGH (ref 6–20)
CALCIUM: 7.9 mg/dL — AB (ref 8.9–10.3)
CO2: 28 mmol/L (ref 22–32)
Chloride: 98 mmol/L — ABNORMAL LOW (ref 101–111)
Creatinine, Ser: 1.29 mg/dL — ABNORMAL HIGH (ref 0.61–1.24)
GFR, EST NON AFRICAN AMERICAN: 53 mL/min — AB (ref >60)
GLUCOSE: 202 mg/dL — AB (ref 65–99)
POTASSIUM: 2.8 mmol/L — AB (ref 3.5–5.1)
SODIUM: 133 mmol/L — AB (ref 135–145)

## 2015-10-26 LAB — CBC WITH DIFFERENTIAL/PLATELET
BASOS PCT: 0 %
Band Neutrophils: 2 %
Basophils Absolute: 0 10*3/uL (ref 0–0.1)
Blasts: 0 %
EOS PCT: 0 %
Eosinophils Absolute: 0 10*3/uL (ref 0–0.7)
HCT: 21.9 % — ABNORMAL LOW (ref 40.0–52.0)
Hemoglobin: 6.9 g/dL — ABNORMAL LOW (ref 13.0–18.0)
LYMPHS PCT: 10 %
Lymphs Abs: 1.3 10*3/uL (ref 1.0–3.6)
MCH: 29.1 pg (ref 26.0–34.0)
MCHC: 31.7 g/dL — AB (ref 32.0–36.0)
MCV: 91.8 fL (ref 80.0–100.0)
MONO ABS: 0.3 10*3/uL (ref 0.2–1.0)
Metamyelocytes Relative: 0 %
Monocytes Relative: 2 %
Myelocytes: 2 %
NEUTROS PCT: 84 %
NRBC: 2 /100{WBCs} — AB
Neutro Abs: 11.2 10*3/uL — ABNORMAL HIGH (ref 1.4–6.5)
OTHER: 0 %
PROMYELOCYTES ABS: 0 %
Platelets: 213 10*3/uL (ref 150–440)
RBC: 2.38 MIL/uL — ABNORMAL LOW (ref 4.40–5.90)
RDW: 14.3 % (ref 11.5–14.5)
WBC: 12.8 10*3/uL — ABNORMAL HIGH (ref 3.8–10.6)

## 2015-10-26 LAB — COMPREHENSIVE METABOLIC PANEL
ALBUMIN: 3.3 g/dL — AB (ref 3.5–5.0)
ALK PHOS: 79 U/L (ref 38–126)
ALT: 14 U/L — ABNORMAL LOW (ref 17–63)
AST: 16 U/L (ref 15–41)
Anion gap: 11 (ref 5–15)
BUN: 50 mg/dL — AB (ref 6–20)
CALCIUM: 8.6 mg/dL — AB (ref 8.9–10.3)
CO2: 28 mmol/L (ref 22–32)
Chloride: 100 mmol/L — ABNORMAL LOW (ref 101–111)
Creatinine, Ser: 1.42 mg/dL — ABNORMAL HIGH (ref 0.61–1.24)
GFR calc Af Amer: 55 mL/min — ABNORMAL LOW (ref >60)
GFR calc non Af Amer: 47 mL/min — ABNORMAL LOW (ref >60)
GLUCOSE: 369 mg/dL — AB (ref 65–99)
Potassium: 3.6 mmol/L (ref 3.5–5.1)
Sodium: 139 mmol/L (ref 135–145)
TOTAL PROTEIN: 6 g/dL — AB (ref 6.5–8.1)
Total Bilirubin: 0.5 mg/dL (ref 0.3–1.2)

## 2015-10-26 LAB — CBC
HEMATOCRIT: 22.2 % — AB (ref 40.0–52.0)
HEMOGLOBIN: 7.5 g/dL — AB (ref 13.0–18.0)
MCH: 30.1 pg (ref 26.0–34.0)
MCHC: 33.6 g/dL (ref 32.0–36.0)
MCV: 89.5 fL (ref 80.0–100.0)
Platelets: 174 10*3/uL (ref 150–440)
RBC: 2.48 MIL/uL — AB (ref 4.40–5.90)
RDW: 15.1 % — ABNORMAL HIGH (ref 11.5–14.5)
WBC: 10.3 10*3/uL (ref 3.8–10.6)

## 2015-10-26 LAB — GLUCOSE, CAPILLARY
GLUCOSE-CAPILLARY: 195 mg/dL — AB (ref 65–99)
GLUCOSE-CAPILLARY: 211 mg/dL — AB (ref 65–99)

## 2015-10-26 LAB — TROPONIN I: Troponin I: <0.03 ng/mL (ref <0.031)

## 2015-10-26 LAB — PREPARE RBC (CROSSMATCH)

## 2015-10-26 MED ORDER — ZOLPIDEM TARTRATE 5 MG PO TABS
5.0000 mg | ORAL_TABLET | Freq: Every evening | ORAL | Status: DC | PRN
  Administered 2015-10-26 – 2015-10-31 (×7): 5 mg via ORAL
  Filled 2015-10-26 (×8): qty 1

## 2015-10-26 MED ORDER — ALPRAZOLAM 0.5 MG PO TABS
0.5000 mg | ORAL_TABLET | Freq: Three times a day (TID) | ORAL | Status: DC | PRN
  Administered 2015-10-27 – 2015-10-31 (×11): 0.5 mg via ORAL
  Filled 2015-10-26 (×11): qty 1

## 2015-10-26 MED ORDER — PANTOPRAZOLE SODIUM 40 MG IV SOLR
8.0000 mg/h | INTRAVENOUS | Status: DC
  Administered 2015-10-26 – 2015-10-28 (×7): 8 mg/h via INTRAVENOUS
  Filled 2015-10-26 (×8): qty 80

## 2015-10-26 MED ORDER — CITALOPRAM HYDROBROMIDE 20 MG PO TABS
40.0000 mg | ORAL_TABLET | Freq: Every day | ORAL | Status: DC
  Administered 2015-10-26 – 2015-11-01 (×7): 40 mg via ORAL
  Filled 2015-10-26 (×7): qty 2

## 2015-10-26 MED ORDER — METOPROLOL TARTRATE 25 MG PO TABS
25.0000 mg | ORAL_TABLET | Freq: Two times a day (BID) | ORAL | Status: DC
  Administered 2015-10-26 – 2015-11-01 (×14): 25 mg via ORAL
  Filled 2015-10-26 (×15): qty 1

## 2015-10-26 MED ORDER — GABAPENTIN 100 MG PO CAPS
100.0000 mg | ORAL_CAPSULE | Freq: Three times a day (TID) | ORAL | Status: DC
  Administered 2015-10-26 – 2015-11-01 (×18): 100 mg via ORAL
  Filled 2015-10-26 (×18): qty 1

## 2015-10-26 MED ORDER — SUCRALFATE 1 G PO TABS
1.0000 g | ORAL_TABLET | Freq: Three times a day (TID) | ORAL | Status: DC
  Administered 2015-10-26 – 2015-11-01 (×21): 1 g via ORAL
  Filled 2015-10-26 (×21): qty 1

## 2015-10-26 MED ORDER — TIOTROPIUM BROMIDE MONOHYDRATE 18 MCG IN CAPS
18.0000 ug | ORAL_CAPSULE | Freq: Every day | RESPIRATORY_TRACT | Status: DC
  Administered 2015-10-26 – 2015-11-01 (×7): 18 ug via RESPIRATORY_TRACT
  Filled 2015-10-26 (×2): qty 5

## 2015-10-26 MED ORDER — SODIUM CHLORIDE 0.9 % IJ SOLN
3.0000 mL | Freq: Two times a day (BID) | INTRAMUSCULAR | Status: DC
  Administered 2015-10-26 – 2015-11-01 (×9): 3 mL via INTRAVENOUS

## 2015-10-26 MED ORDER — DOXYCYCLINE HYCLATE 100 MG PO TABS
100.0000 mg | ORAL_TABLET | Freq: Two times a day (BID) | ORAL | Status: DC
  Administered 2015-10-26 – 2015-11-01 (×13): 100 mg via ORAL
  Filled 2015-10-26 (×13): qty 1

## 2015-10-26 MED ORDER — ATORVASTATIN CALCIUM 20 MG PO TABS
40.0000 mg | ORAL_TABLET | Freq: Every day | ORAL | Status: DC
  Administered 2015-10-26 – 2015-10-31 (×6): 40 mg via ORAL
  Filled 2015-10-26 (×6): qty 2

## 2015-10-26 MED ORDER — INSULIN ASPART 100 UNIT/ML ~~LOC~~ SOLN
0.0000 [IU] | Freq: Three times a day (TID) | SUBCUTANEOUS | Status: DC
  Administered 2015-10-26: 17:00:00 3 [IU] via SUBCUTANEOUS
  Administered 2015-10-27 (×2): 2 [IU] via SUBCUTANEOUS
  Administered 2015-10-27 – 2015-10-28 (×2): 3 [IU] via SUBCUTANEOUS
  Administered 2015-10-28 (×2): 5 [IU] via SUBCUTANEOUS
  Administered 2015-10-29: 19:00:00 11 [IU] via SUBCUTANEOUS
  Administered 2015-10-29 (×2): 5 [IU] via SUBCUTANEOUS
  Administered 2015-10-30: 09:00:00 11 [IU] via SUBCUTANEOUS
  Administered 2015-10-30: 12:00:00 8 [IU] via SUBCUTANEOUS
  Filled 2015-10-26: qty 5
  Filled 2015-10-26: qty 2
  Filled 2015-10-26: qty 3
  Filled 2015-10-26: qty 8
  Filled 2015-10-26 (×2): qty 11
  Filled 2015-10-26: qty 5
  Filled 2015-10-26: qty 3
  Filled 2015-10-26: qty 2
  Filled 2015-10-26 (×2): qty 5
  Filled 2015-10-26: qty 3

## 2015-10-26 MED ORDER — ALBUTEROL SULFATE (2.5 MG/3ML) 0.083% IN NEBU
3.0000 mL | INHALATION_SOLUTION | Freq: Four times a day (QID) | RESPIRATORY_TRACT | Status: DC | PRN
  Administered 2015-10-29 – 2015-10-30 (×5): 3 mL via RESPIRATORY_TRACT
  Filled 2015-10-26 (×5): qty 3

## 2015-10-26 MED ORDER — ALPRAZOLAM 0.5 MG PO TABS
0.5000 mg | ORAL_TABLET | Freq: Three times a day (TID) | ORAL | Status: DC | PRN
  Administered 2015-10-26: 0.5 mg via ORAL
  Filled 2015-10-26: qty 1

## 2015-10-26 MED ORDER — HYDROCODONE-ACETAMINOPHEN 5-325 MG PO TABS
1.0000 | ORAL_TABLET | Freq: Four times a day (QID) | ORAL | Status: DC | PRN
  Administered 2015-10-26 – 2015-10-31 (×5): 1 via ORAL
  Filled 2015-10-26 (×5): qty 1

## 2015-10-26 MED ORDER — SODIUM CHLORIDE 0.9 % IV SOLN
INTRAVENOUS | Status: DC
  Administered 2015-10-26 – 2015-10-28 (×4): via INTRAVENOUS

## 2015-10-26 MED ORDER — INSULIN GLARGINE 100 UNIT/ML ~~LOC~~ SOLN
18.0000 [IU] | Freq: Every day | SUBCUTANEOUS | Status: DC
  Administered 2015-10-26 – 2015-10-29 (×4): 18 [IU] via SUBCUTANEOUS
  Filled 2015-10-26 (×5): qty 0.18

## 2015-10-26 MED ORDER — POTASSIUM CHLORIDE 10 MEQ/100ML IV SOLN
10.0000 meq | INTRAVENOUS | Status: AC
  Administered 2015-10-26 (×4): 10 meq via INTRAVENOUS
  Filled 2015-10-26 (×4): qty 100

## 2015-10-26 MED ORDER — BUDESONIDE-FORMOTEROL FUMARATE 160-4.5 MCG/ACT IN AERO
2.0000 | INHALATION_SPRAY | Freq: Two times a day (BID) | RESPIRATORY_TRACT | Status: DC
  Administered 2015-10-26 – 2015-11-01 (×13): 2 via RESPIRATORY_TRACT
  Filled 2015-10-26: qty 6

## 2015-10-26 MED ORDER — FERROUS SULFATE 325 (65 FE) MG PO TABS
650.0000 mg | ORAL_TABLET | Freq: Three times a day (TID) | ORAL | Status: DC
  Administered 2015-10-26 – 2015-11-01 (×18): 650 mg via ORAL
  Filled 2015-10-26 (×18): qty 2

## 2015-10-26 MED ORDER — SUCRALFATE 1 G PO TABS
1.0000 g | ORAL_TABLET | Freq: Three times a day (TID) | ORAL | Status: DC
  Administered 2015-10-26 (×3): 1 g via ORAL
  Filled 2015-10-26 (×3): qty 1

## 2015-10-26 MED ORDER — GUAIFENESIN 100 MG/5ML PO SOLN
10.0000 mL | Freq: Four times a day (QID) | ORAL | Status: DC | PRN
  Administered 2015-10-26 – 2015-10-31 (×10): 200 mg via ORAL
  Filled 2015-10-26 (×10): qty 10

## 2015-10-26 MED ORDER — SODIUM CHLORIDE 0.9 % IV SOLN
10.0000 mL/h | Freq: Once | INTRAVENOUS | Status: AC
  Administered 2015-10-26: 10 mL/h via INTRAVENOUS

## 2015-10-26 MED ORDER — ACETAMINOPHEN 325 MG PO TABS
650.0000 mg | ORAL_TABLET | Freq: Four times a day (QID) | ORAL | Status: DC | PRN
  Filled 2015-10-26: qty 2

## 2015-10-26 NOTE — ED Notes (Signed)
Blood bank called about pt admission possiblilty
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
## 2015-10-26 NOTE — Plan of Care (Addendum)
Problem: Safety: Goal: Ability to remain free from injury will improve Outcome: Progressing Patient high fall risk. Calls out with needs.  Problem: Pain Managment: Goal: General experience of comfort will improve Outcome: Progressing Patient c/o pain in right knee, refused medicines at this time.   Problem: Fluid Volume: Goal: Ability to maintain a balanced intake and output will improve Outcome: Progressing IV protonix gtt infusing.  Potassium IVPB given for potassium of 2.8. Patient receiving 2nd unit of pRBCs right now. Tolerating well.   IVF infusing. Xanax given PRN patient stated relief.
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
## 2015-10-26 NOTE — Progress Notes (Addendum)
[place] [facility] [place] NAME: [name]    MR#:  [id number]  DATE OF BIRTH:  May 10, 1941  SUBJECTIVE:  CHIEF COMPLAINT:   Chief Complaint  Patient presents with  . Shortness of Breath    REVIEW OF SYSTEMS:   ROS CONSTITUTIONAL:weaknes,SOB EYES: No blurred or double vision.  EARS, NOSE, AND THROAT: No tinnitus or ear pain.  RESPIRATORY: No cough, shortness of breath, wheezing or hemoptysis.  CARDIOVASCULAR: No chest pain, orthopnea, edema.  GASTROINTESTINAL: No nausea, vomiting, diarrhea or abdominal pain.  GENITOURINARY: No dysuria, hematuria.  ENDOCRINE: No polyuria, nocturia,  HEMATOLOGY: No anemia, easy bruising or bleeding SKIN: No rash or lesion. MUSCULOSKELETAL: No joint pain or arthritis.   NEUROLOGIC: No tingling, numbness, weakness.  PSYCHIATRY:anxiety/depression  DRUG ALLERGIES:   Allergies  Allergen Reactions  . Penicillins Anaphylaxis, Hives and Other (See Comments)    Pt is unable to answer additional questions about this medication.    . Demerol [Meperidine] Other (See Comments)    Reaction:  Hallucinations    . Dilaudid [Hydromorphone Hcl] Other (See Comments)    Reaction:  Hallucinations   . Levofloxacin Other (See Comments)    unknown  . Morphine And Related Nausea Only and Other (See Comments)    Hallucinations, rash, shortness of breath    VITALS:  Blood pressure 108/63, pulse 68, temperature 97.6 F (36.4 C), temperature source Oral, resp. rate 20, height 6\' 3"  (1.905 m), weight 120.203 kg (265 lb), SpO2 99 %.  PHYSICAL EXAMINATION: physical exam not done,  GENERAL:  75 y.o.-year-old patient lying in the bed with no acute distress.  EYES: Pupils equal, round, reactive to light and accommodation. No scleral icterus. Extraocular muscles intact.  HEENT: Head atraumatic, normocephalic. Oropharynx and nasopharynx clear.  NECK:  Supple, no jugular venous distention. No thyroid enlargement, no  tenderness.  LUNGS: Normal breath sounds bilaterally, no wheezing, rales,rhonchi or crepitation. No use of accessory muscles of respiration.  CARDIOVASCULAR: S1, S2 normal. No murmurs, rubs, or gallops.  ABDOMEN: Soft, nontender, nondistended. Bowel sounds present. No organomegaly or mass.  EXTREMITIES: No pedal edema, cyanosis, or clubbing.  NEUROLOGIC: Cranial nerves II through XII are intact. Muscle strength 5/5 in all extremities. Sensation intact. Gait not checked.  PSYCHIATRIC: The patient is alert and oriented x 3.  SKIN: No obvious rash, lesion, or ulcer.    LABORATORY PANEL:   CBC  Recent Labs Lab 10/26/15 0839  WBC 10.3  HGB 7.5*  HCT 22.2*  PLT 174   ------------------------------------------------------------------------------------------------------------------  Chemistries   Recent Labs Lab 10/25/15 2313 10/26/15 0839  NA 139 133*  K 3.6 2.8*  CL 100* 98*  CO2 28 28  GLUCOSE 369* 202*  BUN 50* 43*  CREATININE 1.42* 1.29*  CALCIUM 8.6* 7.9*  AST 16  --   ALT 14*  --   ALKPHOS 79  --   BILITOT 0.5  --    ------------------------------------------------------------------------------------------------------------------  Cardiac Enzymes  Recent Labs Lab 10/25/15 2313  TROPONINI <0.03   ------------------------------------------------------------------------------------------------------------------  RADIOLOGY:  Dg Chest Port 1 View  10/25/2015  CLINICAL DATA:  Progressive shortness of breath for 6 days. EXAM: PORTABLE CHEST 1 VIEW COMPARISON:  Most recent radiograph 08/20/2015.  Chest CT 07/01/2015 FINDINGS: Patient is post median sternotomy. Stable cardiomegaly. Retrocardiac hiatal hernia is again seen. Mild vascular congestion stable. Bibasilar opacities, favor atelectasis. Left upper lobe bulla is unchanged. No pneumothorax. No pulmonary edema. IMPRESSION: 1. Cardiomegaly and vascular congestion, unchanged. 2. Large hiatal  hernia.  Bibasilar  opacities, favor atelectasis. Electronically Signed   By: Jeb Levering M.D.   On: 10/25/2015 23:51    EKG:   Orders placed or performed during the hospital encounter of 10/25/15  . EKG 12-Lead  . EKG 12-Lead    ASSESSMENT AND PLAN:  Acute on chronic anemia, with Black stool,chronic GI bleed:on protonix drip,Pending GI consult.no abdominal pain.s/p one unit transfusion,hemoglobin  Up from  6.9 to 7.5 HHTN<CAD ;stable 3.h.o COPD;continue o2, H/oGI bleed before,had EGD,colonosocpy upper endoscopy;erosive gastropathy,duodenal pol;yp.in October.pt wants to see DR.Wohl or DR.Oh but both of them are not on call,DR.Gustavo Lah is informed.He says he Fired multiple doctors  Dr.Anderson,DR.Sainani,dr.Elliott  4.SOB;due to anemia,but he is able to talk full sentences, 5.HLP 6.DMII'restarted lantus,SSI  7.bipolar disorder;/anxiety;main problem is his behavior ,he was talking about all his doctors care,and kept on talking all negative things about doctors and did notlet me ask about present problem and did not even  Do physical exam.he says having trouble with accent ,so told risk management Mr.Vic to see what we can do.none my colleagues are willing to see him as they are also very busy with their own pts.  8.Hypokalemia; due to torsemide;that is on hold,replace k. ADDENDUM;pt apologized to me for his behavior earlier this am,and wanted me to see him.so I examined him,and agreed with Dr,Skulskie for EGD am,continue protonix drip,Physical exam as above. All the records are reviewed and case discussed with Care Management/Social Workerr. Management plans discussed with the patient, family and they are in agreement.  CODE STATUS: full  TOTAL TIME TAKING CARE OF THIS PATIENT: 30 minutes.   POSSIBLE D/C IN 1-2DAYS, DEPENDING ON CLINICAL CONDITION.   Epifanio Lesches M.D on 10/26/2015 at 1:47 PM  Between 7am to 6pm - Pager - [phone]  After 6pm go to [email or web address] - password [email or web address]  [facility]  Office  [phone]  CC: Primary care physician; [name], MD   Note: This dictation was prepared with Dragon dictation along with smaller phrase technology. Any transcriptional errors that result from this process are unintentional.

## 2015-10-26 NOTE — Progress Notes (Signed)
Inpatient Diabetes Program Recommendations  AACE/ADA: New Consensus Statement on Inpatient Glycemic Control (2015)  Target Ranges:  Prepandial:   less than 140 mg/dL      Peak postprandial:   less than 180 mg/dL (1-2 hours)      Critically ill patients:  140 - 180 mg/dL  Results for [name], [name] (MRN [id number]) as of 10/26/2015 10:08  Ref. Range 10/25/2015 23:13 10/26/2015 08:39  Glucose Latest Ref Range: 65-99 mg/dL 369 (H) 202 (H)   Review of Glycemic Control  Diabetes history: DM2 Outpatient Diabetes medications: Lantus 20 units BID, Novolog 10 units TID with meals Current orders for Inpatient glycemic control: NONE  Inpatient Diabetes Program Recommendations: Insulin - Basal: Please consider ordering Lantus 18 units QHS (based on 120 kg x 0.15 units). Correction (SSI): Please consider ordering Novolog moderate correction scale Q4H.  Thanks, Barnie Alderman, RN, MSN, CDE Diabetes Coordinator Inpatient Diabetes Program [phone] (Team Pager from [place] to [place]) [phone] ([facility] office) [phone] [facility] office) [phone] [facility] office)

## 2015-10-26 NOTE — Progress Notes (Signed)
Critical lab value, potassium 2.8. Notified Dr. Vianne Bulls. Also notified that patient has been on doxycycline at home BID. Okay to restart.

## 2015-10-26 NOTE — Progress Notes (Signed)
Patient was agitated, being verbally abusive to staff. Patient is refusing to enter room, yelling in the hallway disturbing the patients. Code 300 was activated. Notified physician that patient wanted to speak with him about bed assignment. Nursing supervisor and security are talking with patient to attempt him to reduce his voice and enter room. The spouse was called to try to diffuse the situation. The patient is yelling and verbally abusive to wife. Patient has been moved to the room. Patient is refusing to answer questions but making many requests.

## 2015-10-26 NOTE — ED Notes (Signed)
Admitting MD at bedside.

## 2015-10-27 ENCOUNTER — Encounter: Payer: Self-pay | Admitting: Anesthesiology

## 2015-10-27 ENCOUNTER — Inpatient Hospital Stay: Payer: Medicare HMO | Admitting: Anesthesiology

## 2015-10-27 ENCOUNTER — Encounter
Admission: EM | Disposition: A | Discharge status: Home Health | Payer: Self-pay | Source: Home / Self Care | Attending: Internal Medicine

## 2015-10-27 ENCOUNTER — Ambulatory Visit: Payer: Commercial Managed Care - HMO | Admitting: Gastroenterology

## 2015-10-27 HISTORY — PX: ESOPHAGOGASTRODUODENOSCOPY (EGD) WITH PROPOFOL: SHX5813

## 2015-10-27 LAB — CBC WITH DIFFERENTIAL/PLATELET
BAND NEUTROPHILS: 3 %
BASOS PCT: 0 %
Basophils Absolute: 0 10*3/uL (ref 0–0.1)
Blasts: 0 %
EOS ABS: 0.1 10*3/uL (ref 0–0.7)
Eosinophils Relative: 1 %
HCT: 27.5 % — ABNORMAL LOW (ref 40.0–52.0)
Hemoglobin: 9.1 g/dL — ABNORMAL LOW (ref 13.0–18.0)
LYMPHS PCT: 13 %
Lymphs Abs: 1.1 10*3/uL (ref 1.0–3.6)
MCH: 29.3 pg (ref 26.0–34.0)
MCHC: 33 g/dL (ref 32.0–36.0)
MCV: 88.7 fL (ref 80.0–100.0)
MONO ABS: 0.7 10*3/uL (ref 0.2–1.0)
MONOS PCT: 9 %
Metamyelocytes Relative: 3 %
Myelocytes: 2 %
NEUTROS ABS: 6.4 10*3/uL (ref 1.4–6.5)
Neutrophils Relative %: 69 %
OTHER: 0 %
Platelets: 185 10*3/uL (ref 150–440)
Promyelocytes Absolute: 0 %
RBC: 3.1 MIL/uL — ABNORMAL LOW (ref 4.40–5.90)
RDW: 15.6 % — AB (ref 11.5–14.5)
WBC: 8.3 10*3/uL (ref 3.8–10.6)
nRBC: 0 /100 WBC

## 2015-10-27 LAB — PROTIME-INR
INR: 1.01
PROTHROMBIN TIME: 13.5 s (ref 11.4–15.0)

## 2015-10-27 LAB — GLUCOSE, CAPILLARY
GLUCOSE-CAPILLARY: 138 mg/dL — AB (ref 65–99)
GLUCOSE-CAPILLARY: 182 mg/dL — AB (ref 65–99)
Glucose-Capillary: 159 mg/dL — ABNORMAL HIGH (ref 65–99)
Glucose-Capillary: 146 mg/dL — ABNORMAL HIGH (ref 65–99)

## 2015-10-27 LAB — POTASSIUM: Potassium: 3.3 mmol/L — ABNORMAL LOW (ref 3.5–5.1)

## 2015-10-27 SURGERY — ESOPHAGOGASTRODUODENOSCOPY (EGD) WITH PROPOFOL
Anesthesia: General

## 2015-10-27 MED ORDER — IPRATROPIUM-ALBUTEROL 0.5-2.5 (3) MG/3ML IN SOLN
RESPIRATORY_TRACT | Status: AC
Start: 2015-10-27 — End: 2015-10-27
  Administered 2015-10-27: 3 mL
  Filled 2015-10-27: qty 3

## 2015-10-27 MED ORDER — POTASSIUM CHLORIDE CRYS ER 20 MEQ PO TBCR
20.0000 meq | EXTENDED_RELEASE_TABLET | Freq: Once | ORAL | Status: AC
  Administered 2015-10-27: 20 meq via ORAL
  Filled 2015-10-27: qty 1

## 2015-10-27 MED ORDER — FENTANYL CITRATE (PF) 100 MCG/2ML IJ SOLN
INTRAMUSCULAR | Status: DC | PRN
  Administered 2015-10-27: 50 ug via INTRAVENOUS

## 2015-10-27 MED ORDER — MIDAZOLAM HCL 5 MG/5ML IJ SOLN
INTRAMUSCULAR | Status: DC | PRN
  Administered 2015-10-27: 1 mg via INTRAVENOUS

## 2015-10-27 MED ORDER — PREDNISONE 10 MG PO TABS
10.0000 mg | ORAL_TABLET | Freq: Every day | ORAL | Status: DC
  Administered 2015-10-28 – 2015-10-29 (×2): 10 mg via ORAL
  Filled 2015-10-27 (×3): qty 1

## 2015-10-27 MED ORDER — LIDOCAINE HCL (CARDIAC) 20 MG/ML IV SOLN
INTRAVENOUS | Status: DC | PRN
  Administered 2015-10-27: 30 mg via INTRAVENOUS

## 2015-10-27 MED ORDER — PROPOFOL 10 MG/ML IV BOLUS
INTRAVENOUS | Status: DC | PRN
  Administered 2015-10-27: 100 mg via INTRAVENOUS

## 2015-10-27 NOTE — Consult Note (Signed)
Patient brought to endoscopy dept for EGD.  On sedation, patient experianced a marked desaturation necessitating terminatin of the procedure.  I will plan for another attempt for the endoscopy, however due to patients comorbidities, anesthesia recommending intubated procedure.  Will arrange when clinically feasible.  Discussed wityh Dr Vianne Bulls.

## 2015-10-27 NOTE — Anesthesia Procedure Notes (Signed)
Date/Time: 10/27/2015 2:30 PM Performed by: Courtney Paris Pre-anesthesia Checklist: Suction available Patient Re-evaluated:Patient Re-evaluated prior to inductionPreoxygenation: Pre-oxygenation with 100% oxygen Intubation Type: IV induction LMA: LMA inserted LMA Size: 4.0 Grade View: Grade II Number of attempts: 1 Placement Confirmation: positive ETCO2,  CO2 detector and breath sounds checked- equal and bilateral Dental Injury: Teeth and Oropharynx as per pre-operative assessment

## 2015-10-27 NOTE — Progress Notes (Signed)
Pt left unit at this time  To go to endo.  Tele pads changed. ccu called

## 2015-10-27 NOTE — Progress Notes (Signed)
[place] [facility] [place] NAME: [name]    MR#:  [id number]  DATE OF BIRTH:  January 23, 1941  SUBJECTIVE: Admitted for anemia, black stools. Patient received 1 unit of transfusion. Getting EGD done today. Patient denies any complaints. On Protonix drip, IV fluids.   CHIEF COMPLAINT:   Chief Complaint  Patient presents with  . Shortness of Breath    REVIEW OF SYSTEMS:   ROS CONSTITUTIONAL:weaknes,SOB EYES: No blurred or double vision.  EARS, NOSE, AND THROAT: No tinnitus or ear pain.  RESPIRATORY: No cough, shortness of breath, wheezing or hemoptysis.  CARDIOVASCULAR: No chest pain, orthopnea, edema.  GASTROINTESTINAL: No nausea, vomiting, diarrhea or abdominal pain.  GENITOURINARY: No dysuria, hematuria.  ENDOCRINE: No polyuria, nocturia,  HEMATOLOGY: No anemia, easy bruising or bleeding SKIN: No rash or lesion. MUSCULOSKELETAL: No joint pain or arthritis.   NEUROLOGIC: No tingling, numbness, weakness.  PSYCHIATRY:anxiety/depression  DRUG ALLERGIES:   Allergies  Allergen Reactions  . Penicillins Anaphylaxis, Hives and Other (See Comments)    Pt is unable to answer additional questions about this medication.    . Demerol [Meperidine] Other (See Comments)    Reaction:  Hallucinations    . Dilaudid [Hydromorphone Hcl] Other (See Comments)    Reaction:  Hallucinations   . Levofloxacin Other (See Comments)    unknown  . Morphine And Related Nausea Only and Other (See Comments)    Hallucinations, rash, shortness of breath    VITALS:  Blood pressure 121/62, pulse 137, temperature 99.2 F (37.3 C), temperature source Tympanic, resp. rate 20, height 6\' 3"  (1.905 m), weight 120.203 kg (265 lb), SpO2 97 %.  PHYSICAL EXAMINATION: physical exam not done,  GENERAL:  75 y.o.-year-old patient lying in the bed with no acute distress.  EYES: Pupils equal, round, reactive to light and accommodation. No scleral icterus. Extraocular  muscles intact.  HEENT: Head atraumatic, normocephalic. Oropharynx and nasopharynx clear.  NECK:  Supple, no jugular venous distention. No thyroid enlargement, no tenderness.  LUNGS: Normal breath sounds bilaterally, no wheezing, rales,rhonchi or crepitation. No use of accessory muscles of respiration.  CARDIOVASCULAR: S1, S2 normal. No murmurs, rubs, or gallops.  ABDOMEN: Soft, nontender, nondistended. Bowel sounds present. No organomegaly or mass.  EXTREMITIES: No pedal edema, cyanosis, or clubbing.  NEUROLOGIC: Cranial nerves II through XII are intact. Muscle strength 5/5 in all extremities. Sensation intact. Gait not checked.  PSYCHIATRIC: The patient is alert and oriented x 3.  SKIN: No obvious rash, lesion, or ulcer.    LABORATORY PANEL:   CBC  Recent Labs Lab 10/27/15 0458  WBC 8.3  HGB 9.1*  HCT 27.5*  PLT 185   ------------------------------------------------------------------------------------------------------------------  Chemistries   Recent Labs Lab 10/25/15 2313 10/26/15 0839 10/27/15 1308  NA 139 133*  --   K 3.6 2.8* 3.3*  CL 100* 98*  --   CO2 28 28  --   GLUCOSE 369* 202*  --   BUN 50* 43*  --   CREATININE 1.42* 1.29*  --   CALCIUM 8.6* 7.9*  --   AST 16  --   --   ALT 14*  --   --   ALKPHOS 79  --   --   BILITOT 0.5  --   --    ------------------------------------------------------------------------------------------------------------------  Cardiac Enzymes  Recent Labs Lab 10/25/15 2313  TROPONINI <0.03   ------------------------------------------------------------------------------------------------------------------  RADIOLOGY:  Dg Chest Port 1 View  10/25/2015  CLINICAL DATA:  Progressive shortness of breath  for 6 days. EXAM: PORTABLE CHEST 1 VIEW COMPARISON:  Most recent radiograph 08/20/2015.  Chest CT 07/01/2015 FINDINGS: Patient is post median sternotomy. Stable cardiomegaly. Retrocardiac hiatal hernia is again seen. Mild vascular  congestion stable. Bibasilar opacities, favor atelectasis. Left upper lobe bulla is unchanged. No pneumothorax. No pulmonary edema. IMPRESSION: 1. Cardiomegaly and vascular congestion, unchanged. 2. Large hiatal hernia.  Bibasilar opacities, favor atelectasis. Electronically Signed   By: Jeb Levering M.D.   On: 10/25/2015 23:51    EKG:   Orders placed or performed during the hospital encounter of 10/25/15  . EKG 12-Lead  . EKG 12-Lead    ASSESSMENT AND PLAN:  Acute on chronic anemia, with Black stool,chronic GI bleed:on protonix drip,Pending GI consult.no abdominal pain.s/p one unit transfusion,hemoglobin improved from 6.9 to 9.1. Patient is going for EGD. 2.HTN<CAD ;stable 3.h.o COPD;continue o2, recently was given doxycycline, prednisone for bronchitis by  his pulmonologist. H/oGI bleed before,had EGD,colonosocpy upper endoscopy;erosive gastropathy,duodenal pol;yp.in October.for EGD today. 4.SOB;due to anemia,but he is able to talk full sentences, 5.HLP 6.DMII'restarted lantus,SSI  7.bipolar disorder;/anxiety; better today than yesterday. Continue home medications with  Xanax. Celexa.  8.Hypokalemia; due to torsemide; improving.  Likely discharge tomorrow. All the records are reviewed and case discussed with Care Management/Social Workerr. Management plans discussed with the patient, family and they are in agreement.  CODE STATUS: full  TOTAL TIME TAKING CARE OF THIS PATIENT: 30 minutes.   POSSIBLE D/C IN 1-2DAYS, DEPENDING ON CLINICAL CONDITION.   Epifanio Lesches M.D on 10/27/2015 at 2:11 PM  Between 7am to 6pm - Pager - [phone]  After 6pm go to [email or web address] - password [email or web address] [facility]  Office  [phone]  CC: Primary care physician; [name], MD   Note: This dictation was prepared with Dragon dictation along with smaller phrase technology. Any transcriptional errors that result from this process are  unintentional.

## 2015-10-27 NOTE — Progress Notes (Signed)
Dr Donnella Sham in to see pt and speak with him regarding  egd procedure for repeat tomorrow  Due to  Unable to perform today. pts sats have maintained well  This pm.pt became beligerant with md.

## 2015-10-27 NOTE — Anesthesia Postprocedure Evaluation (Signed)
Anesthesia Post Note  Patient: [name]  Procedure(s) Performed: Procedure(s) (LRB): ESOPHAGOGASTRODUODENOSCOPY (EGD) WITH PROPOFOL (N/A)  Patient location during evaluation: Endoscopy Anesthesia Type: General Level of consciousness: awake and alert Pain management: pain level controlled Vital Signs Assessment: post-procedure vital signs reviewed and stable Respiratory status: spontaneous breathing, nonlabored ventilation, respiratory function stable and patient connected to nasal cannula oxygen Cardiovascular status: blood pressure returned to baseline and stable Postop Assessment: no signs of nausea or vomiting Anesthetic complications: yes Anesthetic complication details: anesthesia complicationsComments: Patient had large desaturation requiring procedure to be aborted.  Desaturation thought to be 2/2 to airway obstruction. Saturation improved with 2 person ventilation, however, Difficult mask ventilation even with oral airway 2/2 habitus.  LMA placed. Saturation improved fully.  Asked GI doctor if he would like [place] to intubate the patient, he responded that he would like to abort the procedure after this incident.      Last Vitals:  Filed Vitals:   10/27/15 1520 10/27/15 1540  BP: 109/58 109/58  Pulse:    Temp:    Resp:      Last Pain:  Filed Vitals:   10/27/15 1542  PainSc: 0-No pain                 Precious Haws Piscitello

## 2015-10-27 NOTE — Anesthesia Preprocedure Evaluation (Addendum)
Anesthesia Evaluation  Patient identified by MRN, date of birth, ID band Patient awake    Reviewed: Allergy & Precautions, H&P , NPO status , Patient's Chart, lab work & pertinent test results  History of Anesthesia Complications Negative for: history of anesthetic complications  Airway Mallampati: III  TM Distance: >3 FB Neck ROM: limited    Dental  (+) Poor Dentition, Chipped, Missing, Edentulous Upper, Edentulous Lower   Pulmonary shortness of breath, COPD,  COPD inhaler and oxygen dependent, former smoker,    + rhonchi  + decreased breath sounds+ wheezing      Cardiovascular Exercise Tolerance: Poor hypertension, + CAD, + Peripheral Vascular Disease and +CHF  Normal cardiovascular exam Rhythm:regular Rate:Normal     Neuro/Psych PSYCHIATRIC DISORDERS Anxiety Depression Bipolar Disorder negative neurological ROS     GI/Hepatic negative GI ROS, Neg liver ROS,   Endo/Other  diabetes, Type 2  Renal/GU negative Renal ROS  negative genitourinary   Musculoskeletal   Abdominal   Peds  Hematology negative hematology ROS (+)   Anesthesia Other Findings Past Medical History:   Allergic rhinitis, cause unspecified                         Hyperlipidemia                                               CAD (coronary artery disease)                                  Comment:a. 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   Essential hypertension                                       COPD (chronic obstructive pulmonary disease) (*                Comment:a. GOLD stage IV, started home O2. Severe               bullous disease of LUL. Prolonged intubation               after surgeries due to COPD.   AAA (abdominal aortic aneurysm) ([place])                          Comment:a. 12/2008 s/p 7cm, endovascular repair with               coiling right hypogastric artery    Recurrent Microcytic Anemia                                     Comment:a. presumed chronic GI blood loss.   Memory loss                                                  Type II diabetes mellitus ([place])  Chronic diastolic CHF (congestive heart failur*                Comment:a. 04/2015 Echo: EF 55-60%, no rwma, Gr 1 DD,               mild AI.   GI bleed requiring more than 4 units of blood *                Comment:a. 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   AVM (arteriovenous malformation) of colon with*              Multiple gastric polyps                                      Diverticulosis                                               Essential hypertension                          08/18/2009      Comment:Qualifier: Diagnosis of  By: Doy Mince LPN,               Megan     Anxiety state                                   09/10/2013   Depression                                      01/14/2013    Vitamin D deficiency                            08/10/2014   Leucocytosis                                    12/04/2013    Insomnia                                        08/10/2014   Bipolar 1 disorder, mixed, moderate ([place])       04/16/2015    Morbid obesity ([place])                                        Past Surgical History:   CORONARY ARTERY BYPASS GRAFT                                  TONSILLECTOMY  ELBOW SURGERY                                                 APPENDECTOMY                                                  WRIST SURGERY                                                   Comment:For knife wound    stents in femoral artery                                      ESOPHAGOGASTRODUODENOSCOPY                       03/27/2012      Comment:Procedure: ESOPHAGOGASTRODUODENOSCOPY (EGD);                Surgeon: [name], MD;  Location: [facility]               ENDOSCOPY;  Service: Endoscopy;  Laterality:                N/A;   ESOPHAGOGASTRODUODENOSCOPY                       04/07/2012      Comment:Procedure: ESOPHAGOGASTRODUODENOSCOPY (EGD);                Surgeon: Juanita Craver, MD;  Location: [facility]               ENDOSCOPY;  Service: Endoscopy;  Laterality:               N/A;  Rm 1410   GIVENS CAPSULE STUDY                             04/10/2012      Comment:Procedure: GIVENS CAPSULE STUDY;  Surgeon:               Juanita Craver, MD;  Location: [facility] ENDOSCOPY;                Service: Endoscopy;  Laterality: N/A;   COLONOSCOPY                                      04/13/2012      Comment:Procedure: COLONOSCOPY;  Surgeon: [name], MD;  Location: [facility] ENDOSCOPY;  Service:               Endoscopy;  Laterality: N/A;   ESOPHAGOGASTRODUODENOSCOPY                       04/13/2012      Comment:Procedure: ESOPHAGOGASTRODUODENOSCOPY (EGD);  Surgeon: [name], MD;  Location: [facility]               ENDOSCOPY;  Service: Endoscopy;  Laterality:               N/A;   GIVENS CAPSULE STUDY                             05/19/2012       Comment:Procedure: GIVENS CAPSULE STUDY;  Surgeon:               [name], MD;  Location: [facility] ENDOSCOPY;                Service: Endoscopy;  Laterality: N/A;   ESOPHAGOGASTRODUODENOSCOPY                      N/A 12/06/2012      Comment:Procedure: ESOPHAGOGASTRODUODENOSCOPY (EGD);                Surgeon: [name], MD;  Location: [facility]               ENDOSCOPY;  Service: Endoscopy;  Laterality:               N/A;   ESOPHAGOGASTRODUODENOSCOPY                      N/A 08/21/2013      Comment:Procedure: ESOPHAGOGASTRODUODENOSCOPY (EGD);                Surgeon: [name], MD;  Location: [facility]               ENDOSCOPY;  Service: Endoscopy;  Laterality:               N/A;   ESOPHAGOGASTRODUODENOSCOPY                      N/A 09/09/2013     Comment:Procedure: ESOPHAGOGASTRODUODENOSCOPY (EGD);                Surgeon: [name], MD;  Location: [facility]                ENDOSCOPY;  Service: Endoscopy;  Laterality:               N/A;   ESOPHAGOGASTRODUODENOSCOPY                      N/A 09/27/2013     Comment:Hung-snare polypectomy of multiple bleeding               gastric polyp s/p APC   HOT HEMOSTASIS                                  N/A 09/27/2013     Comment:Procedure: HOT HEMOSTASIS (ARGON PLASMA               COAGULATION/BICAP);  Surgeon: [name],               MD;  Location: [facility] ENDOSCOPY;  Service:               Endoscopy;  Laterality: N/A;   GIVENS CAPSULE STUDY                            N/A  12/04/2013      Comment:Procedure: GIVENS CAPSULE STUDY;  Surgeon:               [name], MD;  Location: [facility] ENDOSCOPY;                Service: Endoscopy;  Laterality: N/A;   COLONOSCOPY                                     N/A 12/07/2013      Comment:Kaplan-sigmoid/cecal AVMS, sigoid               diverticulosis   COLONOSCOPY                                     N/A 03/20/2014       Comment:Hung-cecal AVMs s/p APC   ESOPHAGOGASTRODUODENOSCOPY (EGD) WITH PROPOFOL  N/A 04/22/2015       Comment:Procedure: ESOPHAGOGASTRODUODENOSCOPY (EGD)               WITH PROPOFOL;  Surgeon: [name], MD;                Location: [facility] ENDOSCOPY;  Service: Endoscopy;               Laterality: N/A;   ESOPHAGOGASTRODUODENOSCOPY                      N/A 05/07/2015      Comment:Procedure: ESOPHAGOGASTRODUODENOSCOPY (EGD);                Surgeon: Hulen Luster, MD;  Location[facility]               ENDOSCOPY;  Service: Endoscopy;  Laterality:               N/A;   COLONOSCOPY WITH PROPOFOL                       Left 05/11/2015      Comment:Procedure: COLONOSCOPY WITH PROPOFOL;  Surgeon:              Hulen Luster, MD;  Location: [facility] ENDOSCOPY;                Service: Endoscopy;  Laterality: Left;   ESOPHAGOGASTRODUODENOSCOPY (EGD) WITH PROPOFOL  N/A 07/29/2015     Comment:Procedure: ESOPHAGOGASTRODUODENOSCOPY (EGD)               WITH PROPOFOL;  Surgeon: Manya Silvas, MD;                [facility] END[facility]COPY;  Service: Endoscopy;               Laterality: N/A;  BMI    Body Mass Index   33.12 kg/m 2    Patient is NPO appropriate and reports no nausea or vomiting today.     Reproductive/Obstetrics negative OB ROS                            Anesthesia Physical Anesthesia Plan  ASA: IV  Anesthesia Plan: General   Post-op Pain Management:    Induction:   Airway Management Planned:   Additional Equipment:   Intra-op Plan:   Post-operative Plan:   Informed Consent: I have reviewed the  patients History and Physical, chart, labs and discussed the procedure including the risks, benefits and alternatives for the proposed anesthesia with the patient or authorized representative who has indicated his/her understanding and acceptance.   Dental Advisory Given  Plan Discussed with: Anesthesiologist, CRNA and Surgeon  Anesthesia Plan Comments: (Patient informed that they are higher risk for complications from anesthesia during this procedure due to their medical history.  Patient voiced understanding. )        Anesthesia Quick Evaluation

## 2015-10-27 NOTE — Plan of Care (Signed)
Problem: Safety: Goal: Ability to remain free from injury will improve Outcome: Progressing On high fall precaution per policy. Assist to [facility].  Problem: Pain Managment: Goal: General experience of comfort will improve Outcome: Progressing PRN pain meds given for pain with improvement.

## 2015-10-27 NOTE — Plan of Care (Signed)
Problem: Education: Goal: Knowledge of [facility] Education information/materials will improve Outcome: Progressing Discussed  Diet with pt  And  egd plans   Problem: Safety: Goal: Ability to remain free from injury will improve Outcome: Progressing Free from injury. Pt calls for assist if needing to be  Up beside bed  Problem: Health Behavior/Discharge Planning: Goal: Ability to manage health-related needs will improve Outcome: Not Progressing Pt having mood swings today.  Rants and raves at intevals and at times will  Be appropriate.  Problem: Pain Managment: Goal: General experience of comfort will improve Outcome: Progressing Prn x1 this pm  Prn for anxiousness.   Problem: Fluid Volume: Goal: Ability to maintain a balanced intake and output will improve Outcome: Progressing ivfs cont  And  protonix drip cont.

## 2015-10-27 NOTE — Brief Op Note (Signed)
Sats to 40s when given sedation for EGD. LMA inserted by anesthesiology. Procedure aborted. See anesthesia notes.

## 2015-10-27 NOTE — Transfer of Care (Signed)
Immediate Anesthesia Transfer of Care Note  Patient: [name]  Procedure(s) Performed: Procedure(s) with comments: ESOPHAGOGASTRODUODENOSCOPY (EGD) WITH PROPOFOL (N/A) - Multiple systemic health issues will need anesthesia assistance.  Patient Location: PACU  Anesthesia Type:General  Level of Consciousness: awake, oriented and patient cooperative  Airway & Oxygen Therapy: Patient Spontanous Breathing and Patient connected to nasal cannula oxygen  Post-op Assessment: Report given to RN and Post -op Vital signs reviewed and stable  Post vital signs: Reviewed and stable  Last Vitals:  Filed Vitals:   10/27/15 1505 10/27/15 1506  BP: 113/55 135/55  Pulse: 94 9  Temp: 36.2 C 36.2 C  Resp: 21     Complications: No apparent anesthesia complications

## 2015-10-27 NOTE — H&P (Signed)
Subjective: Patient seen for heme positive stool, black stools and anemia. Patient denies any nausea or vomiting overnight. There's been no abdominal pain. He's had no bowel movement overnight. Overall appears comfortable.  Objective: Vital signs in last 24 hours: Temp:  [97.5 F (36.4 C)-99.2 F (37.3 C)] 99.2 F (37.3 C) (01/10 1333) Pulse Rate:  [58-137] 137 (01/10 1333) Resp:  [18-20] 20 (01/10 1333) BP: (106-127)/(56-76) 121/62 mmHg (01/10 1333) SpO2:  [97 %-100 %] 97 % (01/10 1333) Blood pressure 121/62, pulse 137, temperature 99.2 F (37.3 C), temperature source Tympanic, resp. rate 20, height 6\' 3"  (1.905 m), weight 120.203 kg (265 lb), SpO2 97 %.   Intake/Output from previous day: 01/09 0701 - 01/10 0700 In: 2032 [P.O.:1240; Blood:792] Out: [id number] [Urine:3250]  Intake/Output this shift: Total I/O In: -  Out: 875 [Urine:875]   General appearance:  [name] no distress Resp:  Coarse rhonchi and wheezing right greater than left Cardio:  Regular rate and rhythm GI:  Protuberant soft, bowel sounds are positive normoactive, nontender. Nondistended. Extremities:  No clubbing cyanosis or edema.   Lab Results: Results for orders placed or performed during the hospital encounter of 10/25/15 (from the past 24 hour(s))  Glucose, capillary     Status: Abnormal   Collection Time: 10/26/15  4:40 PM  Result Value Ref Range   Glucose-Capillary 195 (H) 65 - 99 mg/dL  Glucose, capillary     Status: Abnormal   Collection Time: 10/26/15  9:33 PM  Result Value Ref Range   Glucose-Capillary 211 (H) 65 - 99 mg/dL  CBC with Differential/Platelet     Status: Abnormal   Collection Time: 10/27/15  4:58 AM  Result Value Ref Range   WBC 8.3 3.8 - 10.6 K/uL   RBC 3.10 (L) 4.40 - 5.90 MIL/uL   Hemoglobin 9.1 (L) 13.0 - 18.0 g/dL   HCT 27.5 (L) 40.0 - 52.0 %   MCV 88.7 80.0 - 100.0 fL   MCH 29.3 26.0 - 34.0 pg   MCHC 33.0 32.0 - 36.0 g/dL   RDW 15.6 (H) 11.5 - 14.5 %   Platelets 185 150 - 440 K/uL   Neutrophils Relative % 69 %   Lymphocytes Relative 13 %   Monocytes Relative 9 %   Eosinophils Relative 1 %   Basophils Relative 0 %   Band Neutrophils 3 %   Metamyelocytes Relative 3 %   Myelocytes 2 %   Promyelocytes Absolute 0 %   Blasts 0 %   nRBC 0 0 /100 WBC   Other 0 %   Neutro Abs 6.4 1.4 - 6.5 K/uL   Lymphs Abs 1.1 1.0 - 3.6 K/uL   Monocytes Absolute 0.7 0.2 - 1.0 K/uL   Eosinophils Absolute 0.1 0 - 0.7 K/uL   Basophils Absolute 0.0 0 - 0.1 K/uL   RBC Morphology POLYCHROMASIA PRESENT   Protime-INR     Status: None   Collection Time: 10/27/15  4:58 AM  Result Value Ref Range   Prothrombin Time 13.5 11.4 - 15.0 seconds   INR 1.01   Glucose, capillary     Status: Abnormal   Collection Time: 10/27/15  7:34 AM  Result Value Ref Range   Glucose-Capillary 159 (H) 65 - 99 mg/dL  Glucose, capillary     Status: Abnormal   Collection Time: 10/27/15 11:16 AM  Result Value Ref Range   Glucose-Capillary 146 (H) 65 - 99 mg/dL  Potassium     Status: Abnormal   Collection Time: 10/27/15  1:08 PM  Result Value Ref Range   Potassium 3.3 (L) 3.5 - 5.1 mmol/L      Recent Labs  10/25/15 2313 10/26/15 0839 10/27/15 0458  WBC 12.8* 10.3 8.3  HGB 6.9* 7.5* 9.1*  HCT 21.9* 22.2* 27.5*  PLT 213 174 185   BMET  Recent Labs  10/25/15 2313 10/26/15 0839 10/27/15 1308  NA 139 133*  --   K 3.6 2.8* 3.3*  CL 100* 98*  --   CO2 28 28  --   GLUCOSE 369* 202*  --   BUN 50* 43*  --   CREATININE 1.42* 1.29*  --   CALCIUM 8.6* 7.9*  --    LFT  Recent Labs  10/25/15 2313  PROT 6.0*  ALBUMIN 3.3*  AST 16  ALT 14*  ALKPHOS 79  BILITOT 0.5   PT/INR  Recent Labs  10/25/15 2313 10/27/15 0458  LABPROT 13.9 13.5  INR 1.05 1.01   Hepatitis Panel No results for input(s): HEPBSAG, HCVAB, HEPAIGM, HEPBIGM in the last 72 hours. C-Diff No results for input(s): CDIFFTOX in the last 72 hours. No results for input(s): CDIFFPCR in the last 72  hours.   Studies/Results: Dg Chest Port 1 View  10/25/2015  CLINICAL DATA:  Progressive shortness of breath for 6 days. EXAM: PORTABLE CHEST 1 VIEW COMPARISON:  Most recent radiograph 08/20/2015.  Chest CT 07/01/2015 FINDINGS: Patient is post median sternotomy. Stable cardiomegaly. Retrocardiac hiatal hernia is again seen. Mild vascular congestion stable. Bibasilar opacities, favor atelectasis. Left upper lobe bulla is unchanged. No pneumothorax. No pulmonary edema. IMPRESSION: 1. Cardiomegaly and vascular congestion, unchanged. 2. Large hiatal hernia.  Bibasilar opacities, favor atelectasis. Electronically Signed   By: Jeb Levering M.D.   On: 10/25/2015 23:51    Scheduled Inpatient Medications:   . [MAR Hold] atorvastatin  40 mg Oral QHS  . [MAR Hold] budesonide-formoterol  2 puff Inhalation BID  . [MAR Hold] citalopram  40 mg Oral Daily  . [MAR Hold] doxycycline  100 mg Oral BID  . [MAR Hold] ferrous sulfate  650 mg Oral TID WC  . [MAR Hold] gabapentin  100 mg Oral TID  . [MAR Hold] insulin aspart  0-15 Units Subcutaneous TID WC  . [MAR Hold] insulin glargine  18 Units Subcutaneous QHS  . [MAR Hold] metoprolol tartrate  25 mg Oral BID  . [MAR Hold] sodium chloride  3 mL Intravenous Q12H  . [MAR Hold] sucralfate  1 g Oral TID WC & HS  . [MAR Hold] tiotropium  18 mcg Inhalation Daily    Continuous Inpatient Infusions:   . sodium chloride 75 mL/hr at 10/27/15 0846  . pantoprozole (PROTONIX) infusion 8 mg/hr (10/27/15 0846)    PRN Inpatient Medications:  [MAR Hold] acetaminophen, [MAR Hold] albuterol, [MAR Hold] ALPRAZolam, [MAR Hold] guaiFENesin, [MAR Hold] HYDROcodone-acetaminophen, [MAR Hold] zolpidem  Miscellaneous:   Assessment:  1. Recurrent anemia with GI blood loss. Black stools or past couple days in the setting of use of NSAIDs. His history of AVMs. Hemodynamically stable.  Plan:  1. EGD today. I have discussed the risks benefits and complications of procedures to  include not limited to bleeding, infection, perforation and the risk of sedation and the patient wishes to proceed. Continue current medications. Further recommendations follow.  Lollie Sails MD 10/27/2015, 2:01 PM

## 2015-10-28 ENCOUNTER — Encounter
Admission: EM | Disposition: A | Discharge status: Home Health | Payer: Self-pay | Source: Home / Self Care | Attending: Internal Medicine

## 2015-10-28 ENCOUNTER — Encounter: Payer: Self-pay | Admitting: Anesthesiology

## 2015-10-28 LAB — CBC WITH DIFFERENTIAL/PLATELET
BAND NEUTROPHILS: 2 %
BASOS PCT: 0 %
BLASTS: 0 %
Basophils Absolute: 0 10*3/uL (ref 0–0.1)
EOS ABS: 0.1 10*3/uL (ref 0–0.7)
EOS PCT: 2 %
HCT: 24.2 % — ABNORMAL LOW (ref 40.0–52.0)
Hemoglobin: 7.7 g/dL — ABNORMAL LOW (ref 13.0–18.0)
LYMPHS ABS: 1.3 10*3/uL (ref 1.0–3.6)
LYMPHS PCT: 18 %
MCH: 28.6 pg (ref 26.0–34.0)
MCHC: 31.9 g/dL — AB (ref 32.0–36.0)
MCV: 89.5 fL (ref 80.0–100.0)
MONO ABS: 0.4 10*3/uL (ref 0.2–1.0)
MONOS PCT: 5 %
Metamyelocytes Relative: 1 %
Myelocytes: 0 %
NEUTROS ABS: 5.2 10*3/uL (ref 1.4–6.5)
Neutrophils Relative %: 72 %
OTHER: 0 %
Platelets: 164 10*3/uL (ref 150–440)
Promyelocytes Absolute: 0 %
RBC: 2.7 MIL/uL — ABNORMAL LOW (ref 4.40–5.90)
RDW: 15.5 % — AB (ref 11.5–14.5)
WBC: 7 10*3/uL (ref 3.8–10.6)
nRBC: 0 /100 WBC

## 2015-10-28 LAB — GLUCOSE, CAPILLARY
GLUCOSE-CAPILLARY: 250 mg/dL — AB (ref 65–99)
GLUCOSE-CAPILLARY: 211 mg/dL — AB (ref 65–99)
Glucose-Capillary: 159 mg/dL — ABNORMAL HIGH (ref 65–99)
Glucose-Capillary: 168 mg/dL — ABNORMAL HIGH (ref 65–99)

## 2015-10-28 LAB — PREPARE RBC (CROSSMATCH)

## 2015-10-28 SURGERY — ESOPHAGOGASTRODUODENOSCOPY (EGD) WITH PROPOFOL
Anesthesia: General

## 2015-10-28 MED ORDER — MENTHOL 3 MG MT LOZG
1.0000 | LOZENGE | OROMUCOSAL | Status: DC | PRN
  Administered 2015-10-28 (×2): 3 mg via ORAL
  Filled 2015-10-28 (×2): qty 9

## 2015-10-28 MED ORDER — ACETAMINOPHEN 325 MG PO TABS
650.0000 mg | ORAL_TABLET | Freq: Once | ORAL | Status: AC
  Administered 2015-10-28: 650 mg via ORAL
  Filled 2015-10-28: qty 2

## 2015-10-28 MED ORDER — SODIUM CHLORIDE 0.9 % IV SOLN
Freq: Once | INTRAVENOUS | Status: AC
  Administered 2015-10-28: 13:00:00 via INTRAVENOUS

## 2015-10-28 MED ORDER — FUROSEMIDE 10 MG/ML IJ SOLN
40.0000 mg | Freq: Once | INTRAMUSCULAR | Status: AC
  Administered 2015-10-28: 40 mg via INTRAVENOUS
  Filled 2015-10-28: qty 4

## 2015-10-28 MED ORDER — CEPASTAT 14.5 MG MT LOZG: 1.0000 | LOZENGE | OROMUCOSAL | Status: DC | PRN

## 2015-10-28 MED ORDER — PANTOPRAZOLE SODIUM 40 MG IV SOLR
40.0000 mg | Freq: Two times a day (BID) | INTRAVENOUS | Status: DC
  Administered 2015-10-29 – 2015-11-01 (×7): 40 mg via INTRAVENOUS
  Filled 2015-10-28 (×8): qty 40

## 2015-10-28 NOTE — Care Management (Signed)
Admitted to this facility with the diagnosis of dyspnea. Wife is [name] [phone]). Chronic home oxygen thru [place] x 2 years. Receiving serves thru [employer] (RN,PR, Aide). States he last seen Dr. Magdalene Molly at [facility] about 2 months ago. F[place].  Shelbie Ammons RN MSN CCM Care Management [phone]

## 2015-10-28 NOTE — Progress Notes (Signed)
Patient ID: [name], male   DOB: 1941/07/19, 75 y.o.   MRN: [id number] [facility] PROGRESS NOTE  [name] [id number] DOB: 11/25/1940 DOA: 10/25/2015 PCP: [name], MD  HPI/Subjective: Patient upset that he is hearing for me that the procedure was canceled today and not from the anesthesiologist to cancel the procedure. He states once the purpose of giving the blood if he can stop the bleeding. Patient with lots of complaints about the food being horrible here. I know this patient from previous hospitalizations and this is his personality of being difficult. He does like me from a previous hospitalization.  Objective: Filed Vitals:   10/28/15 0518 10/28/15 0834  BP: 122/55 121/69  Pulse: 64 79  Temp: 97.9 F (36.6 C)   Resp: 20 20    Filed Weights   10/25/15 2203  Weight: 120.203 kg (265 lb)    ROS: Review of Systems  Constitutional: Negative for fever and chills.  Eyes: Negative for blurred vision.  Respiratory: Positive for cough and shortness of breath.   Cardiovascular: Negative for chest pain.  Gastrointestinal: Positive for melena. Negative for nausea, vomiting, abdominal pain, diarrhea and constipation.  Genitourinary: Negative for dysuria.  Musculoskeletal: Negative for joint pain.  Neurological: Negative for dizziness and headaches.   Exam: Physical Exam  Constitutional: He is oriented to person, place, and time.  HENT:  Right Ear: Tympanic membrane normal.  Left Ear: Tympanic membrane normal.  Nose: No mucosal edema.  Mouth/Throat: No oropharyngeal exudate or posterior oropharyngeal edema.  Eyes: Conjunctivae, EOM and lids are normal. Pupils are equal, round, and reactive to light.  Neck: Neck supple. No JVD present. Carotid bruit is not present. No tracheal deviation and no edema present. No thyroid mass and no thyromegaly present.  Cardiovascular: Regular rhythm, S1 normal, S2 normal and normal heart sounds.  Exam  reveals no gallop.   No murmur heard. Pulses:      Carotid pulses are 2+ on the right side, and 2+ on the left side.      Dorsalis pedis pulses are 2+ on the right side, and 2+ on the left side.  Respiratory: No accessory muscle usage. No respiratory distress. He has decreased breath sounds in the right middle field, the right lower field, the left middle field and the left lower field. He has wheezes in the right lower field. He has no rhonchi. He has no rales.  GI: Soft. Bowel sounds are normal. He exhibits no distension. There is no hepatosplenomegaly. There is no tenderness. There is no CVA tenderness.  Musculoskeletal:       Right ankle: He exhibits swelling.       Left ankle: He exhibits swelling.  Lymphadenopathy:    He has no cervical adenopathy.    He has no axillary adenopathy.  Neurological: He is alert and oriented to person, place, and time. He has normal strength. No cranial nerve deficit or sensory deficit.  Reflex Scores:      Patellar reflexes are 2+ on the right side and 2+ on the left side. Skin: Skin is warm. No rash noted. Nails show no clubbing.  Psychiatric: He has a normal mood and affect.    Data Reviewed: Basic Metabolic Panel:  Recent Labs Lab 10/25/15 2313 10/26/15 0839 10/27/15 1308  NA 139 133*  --   K 3.6 2.8* 3.3*  CL 100* 98*  --   CO2 28 28  --   GLUCOSE 369* 202*  --  BUN 50* 43*  --   CREATININE 1.42* 1.29*  --   CALCIUM 8.6* 7.9*  --    Liver Function Tests:  Recent Labs Lab 10/25/15 2313  AST 16  ALT 14*  ALKPHOS 79  BILITOT 0.5  PROT 6.0*  ALBUMIN 3.3*  CBC:  Recent Labs Lab 10/25/15 2313 10/26/15 0839 10/27/15 0458 10/28/15 0846  WBC 12.8* 10.3 8.3 7.0  NEUTROABS 11.2*  --  6.4 5.2  HGB 6.9* 7.5* 9.1* 7.7*  HCT 21.9* 22.2* 27.5* 24.2*  MCV 91.8 89.5 88.7 89.5  PLT 213 174 185 164   Cardiac Enzymes:  Recent Labs Lab 10/25/15 2313  TROPONINI <0.03   BNP (last 3 results)  Recent Labs  05/06/15 1354  BNP  181.0*    CBG:  Recent Labs Lab 10/27/15 1116 10/27/15 1644 10/27/15 2130 10/28/15 0727 10/28/15 1140  GLUCAP 146* 138* 182* 159* 250*    Scheduled Meds: . sodium chloride   Intravenous Once  . acetaminophen  650 mg Oral Once  . atorvastatin  40 mg Oral QHS  . budesonide-formoterol  2 puff Inhalation BID  . citalopram  40 mg Oral Daily  . doxycycline  100 mg Oral BID  . ferrous sulfate  650 mg Oral TID WC  . furosemide  40 mg Intravenous Once  . gabapentin  100 mg Oral TID  . insulin aspart  0-15 Units Subcutaneous TID WC  . insulin glargine  18 Units Subcutaneous QHS  . metoprolol tartrate  25 mg Oral BID  . predniSONE  10 mg Oral Q breakfast  . sodium chloride  3 mL Intravenous Q12H  . sucralfate  1 g Oral TID WC & HS  . tiotropium  18 mcg Inhalation Daily   Continuous Infusions: . pantoprozole (PROTONIX) infusion 8 mg/hr (10/28/15 0827)    Assessment/Plan:  1. Acute on chronic blood loss anemia. The patient has had numerous episodes of bleeding in the past and had numerous procedures. EGD could not be completed last night secondary to desaturations. Anesthesia canceled procedure today. The patient is willing to be intubated in order to complete the procedure. Since hemoglobin dropped down to 7.7 I will transfuse another unit of blood. I will DC IV fluids and put on full liquid diet. Check hemoglobin again tomorrow. I spoke with Dr. Gustavo Lah gastroenterology and he will speak with anesthesia and potentially set up procedure for Friday. 2. Chronic respiratory failure with COPD. Finishing up prednisone taper and doxycycline for COPD exacerbation. Patient willing to be intubated for endoscopy. I will do stress dose steroids prior to procedure when scheduled. 3. Type 2 diabetes mellitus- sliding scale and Lantus insulin 4. Hyperlipidemia unspecified continue atorvastatin 5. Diabetic neuropathy on gabapentin  Code Status:     Code Status Orders        Start      Ordered   10/26/15 0315  Full code   Continuous     10/26/15 0314    Code Status History    Date Active Date Inactive Code Status Order ID Comments User Context   08/20/2015  4:41 AM 08/22/2015  4:49 PM Full Code [id number]  Lytle Butte, MD ED   08/20/2015  2:54 AM 08/20/2015  4:41 AM Full Code [id number]  Lytle Butte, MD ED   07/27/2015  8:40 PM 07/29/2015  6:29 PM Full Code [id number]  Lytle Butte, MD ED   06/30/2015 12:40 PM 07/03/2015  3:26 PM Full Code [id number]  Bettey Costa, MD Inpatient  05/19/2015  2:59 PM 05/23/2015  4:21 PM Full Code [id number]  Max Sane, MD Inpatient   05/06/2015  5:14 PM 05/13/2015  5:06 PM Full Code [id number]  Epifanio Lesches, MD ED   05/03/2015 12:29 PM 05/04/2015  7:20 PM Full Code [id number]  Idelle Crouch, MD Inpatient   04/22/2015  4:50 AM 04/27/2015  7:35 PM Full Code [id number]  Harrie Foreman, MD Inpatient   04/17/2015  7:16 PM 04/19/2015  5:16 PM Full Code [id number]  Clovis Fredrickson, MD Inpatient   04/10/2015  9:12 AM 04/17/2015  7:08 PM Full Code [id number]  Demetrios Loll, MD Inpatient   03/09/2015 12:18 PM 03/12/2015  6:45 PM Full Code [id number]  Henreitta Leber, MD Inpatient   08/24/2014  9:41 PM 08/25/2014  5:20 PM Full Code [id number]  Ivor Costa, MD ED   04/02/2014  8:11 PM 04/04/2014  5:10 PM Full Code [id number]  Velvet Bathe, MD Inpatient   03/18/2014  3:44 PM 03/22/2014  2:06 PM Full Code [id number]  Verlee Monte, MD Inpatient   12/04/2013  6:13 AM 12/10/2013  7:54 PM Full Code [id number]  Rise Patience, MD Inpatient   10/15/2013  8:11 PM 10/18/2013  6:40 PM Full Code [id number]  Fuller Plan, MD Inpatient   09/08/2013 12:43 AM 09/10/2013  3:30 PM Full Code [id number]  Toy Baker, MD Inpatient   08/19/2013  5:48 PM 08/22/2013  3:51 PM Full Code [id number]  Orson Eva, MD ED   05/15/2013  1:26 AM 05/17/2013  4:59 PM Full Code [id number]  Theodis Blaze, MD Inpatient   12/04/2012  5:23 PM 12/08/2012  4:56 PM Full Code [id number]  Janece Canterbury, MD Inpatient   05/17/2012 12:56  PM 05/21/2012  6:09 PM Full Code [id number]  Campbell Lerner, RN ED   04/06/2012  7:35 PM 04/14/2012  5:23 PM Full Code [id number]  Marylou Mccoy, RN Inpatient   03/27/2012  4:44 AM 03/28/2012  5:56 PM Full Code [id number]  Theotis Barrio, RN Inpatient    Advance Directive Documentation        Most Recent Value   Type of Advance Directive  Healthcare Power of Attorney   Pre-existing out of facility DNR order (yellow form or pink MOST form)     "MOST" Form in Place?       Disposition Plan: To be determined  Consultants:  gastroenterolgy  Time spent: 30 minutes  Loletha Grayer  [facility]

## 2015-10-28 NOTE — Care Management Important Message (Signed)
Important Message  Patient Details  Name: [name] MRN: [id number] Date of Birth: 1941/06/26   Medicare Important Message Given:  Yes    Juliann Pulse A Minoru Chap 10/28/2015, 11:38 AM

## 2015-10-28 NOTE — Plan of Care (Signed)
Problem: Education: Goal: Knowledge of [facility] Education information/materials will improve Outcome: Progressing Pt is alert and oriented, c/o cough. Robitussin given, cough drops ordered. Receiving Protonix drip and NS at 75 ml/hr. No BM at this time.  Problem: Safety: Goal: Ability to remain free from injury will improve Outcome: Progressing Pt is a high fall risk, encouraged to call for assistance when needed.  Problem: Pain Managment: Goal: General experience of comfort will improve Outcome: Progressing Pt c/o chronic back pain, refuses pain medication at this time. Requests Ambien for sleep.

## 2015-10-28 NOTE — Consult Note (Signed)
Subjective: Patient seen for anemia, history of GI blood loss. Some drop of hemoglobin noted this morning. He is been no nausea or emesis. He tolerated a soft lunch. She reports of black stool however recorded in chart as brown. Denies abdominal pain. Patient is much less confrontational today.  Objective: Vital signs in last 24 hours: Temp:  [97.6 F (36.4 C)-98.4 F (36.9 C)] 97.7 F (36.5 C) (01/11 1607) Pulse Rate:  [64-99] 66 (01/11 1607) Resp:  [20] 20 (01/11 1607) BP: (119-129)/(55-73) 125/70 mmHg (01/11 1607) SpO2:  [97 %-100 %] 99 % (01/11 1607) Blood pressure 125/70, pulse 66, temperature 97.7 F (36.5 C), temperature source Oral, resp. rate 20, height 6\' 3"  (1.905 m), weight 120.203 kg (265 lb), SpO2 99 %.   Intake/Output from previous day: 01/10 0701 - 01/11 0700 In: -  Out: 2375 [Urine:2375]  Intake/Output this shift: Total I/O In: 24 [I.V.:24] Out: 800 [Urine:800]   General appearance:  [name] no distress Resp:  Bilaterally clear with diminished breath sounds Cardio:  Regular rate and rhythm GI:  Protuberant soft, nontender bowel sounds are positive Extremities:  No clubbing cyanosis or edema   Lab Results: Results for orders placed or performed during the hospital encounter of 10/25/15 (from the past 24 hour(s))  Glucose, capillary     Status: Abnormal   Collection Time: 10/27/15  4:44 PM  Result Value Ref Range   Glucose-Capillary 138 (H) 65 - 99 mg/dL  Glucose, capillary     Status: Abnormal   Collection Time: 10/27/15  9:30 PM  Result Value Ref Range   Glucose-Capillary 182 (H) 65 - 99 mg/dL   Comment 1 Notify RN   Glucose, capillary     Status: Abnormal   Collection Time: 10/28/15  7:27 AM  Result Value Ref Range   Glucose-Capillary 159 (H) 65 - 99 mg/dL   Comment 1 Notify RN   CBC with Differential/Platelet     Status: Abnormal   Collection Time: 10/28/15  8:46 AM  Result Value Ref Range   WBC 7.0 3.8 - 10.6 K/uL   RBC 2.70 (L)  4.40 - 5.90 MIL/uL   Hemoglobin 7.7 (L) 13.0 - 18.0 g/dL   HCT 24.2 (L) 40.0 - 52.0 %   MCV 89.5 80.0 - 100.0 fL   MCH 28.6 26.0 - 34.0 pg   MCHC 31.9 (L) 32.0 - 36.0 g/dL   RDW 15.5 (H) 11.5 - 14.5 %   Platelets 164 150 - 440 K/uL   Neutrophils Relative % 72 %   Lymphocytes Relative 18 %   Monocytes Relative 5 %   Eosinophils Relative 2 %   Basophils Relative 0 %   Band Neutrophils 2 %   Metamyelocytes Relative 1 %   Myelocytes 0 %   Promyelocytes Absolute 0 %   Blasts 0 %   nRBC 0 0 /100 WBC   Other 0 %   Neutro Abs 5.2 1.4 - 6.5 K/uL   Lymphs Abs 1.3 1.0 - 3.6 K/uL   Monocytes Absolute 0.4 0.2 - 1.0 K/uL   Eosinophils Absolute 0.1 0 - 0.7 K/uL   Basophils Absolute 0.0 0 - 0.1 K/uL  Glucose, capillary     Status: Abnormal   Collection Time: 10/28/15 11:40 AM  Result Value Ref Range   Glucose-Capillary 250 (H) 65 - 99 mg/dL  Glucose, capillary     Status: Abnormal   Collection Time: 10/28/15  4:24 PM  Result Value Ref Range   Glucose-Capillary 211 (H)  65 - 99 mg/dL   Comment 1 Notify RN       Recent Labs  10/26/15 0839 10/27/15 0458 10/28/15 0846  WBC 10.3 8.3 7.0  HGB 7.5* 9.1* 7.7*  HCT 22.2* 27.5* 24.2*  PLT 174 185 164   BMET  Recent Labs  10/25/15 2313 10/26/15 0839 10/27/15 1308  NA 139 133*  --   K 3.6 2.8* 3.3*  CL 100* 98*  --   CO2 28 28  --   GLUCOSE 369* 202*  --   BUN 50* 43*  --   CREATININE 1.42* 1.29*  --   CALCIUM 8.6* 7.9*  --    LFT  Recent Labs  10/25/15 2313  PROT 6.0*  ALBUMIN 3.3*  AST 16  ALT 14*  ALKPHOS 79  BILITOT 0.5   PT/INR  Recent Labs  10/25/15 2313 10/27/15 0458  LABPROT 13.9 13.5  INR 1.05 1.01   Hepatitis Panel No results for input(s): HEPBSAG, HCVAB, HEPAIGM, HEPBIGM in the last 72 hours. C-Diff No results for input(s): CDIFFTOX in the last 72 hours. No results for input(s): CDIFFPCR in the last 72 hours.   Studies/Results: No results found.  Scheduled Inpatient Medications:   .  atorvastatin  40 mg Oral QHS  . budesonide-formoterol  2 puff Inhalation BID  . citalopram  40 mg Oral Daily  . doxycycline  100 mg Oral BID  . ferrous sulfate  650 mg Oral TID WC  . gabapentin  100 mg Oral TID  . insulin aspart  0-15 Units Subcutaneous TID WC  . insulin glargine  18 Units Subcutaneous QHS  . metoprolol tartrate  25 mg Oral BID  . [START ON 10/29/2015] pantoprazole (PROTONIX) IV  40 mg Intravenous Q12H  . predniSONE  10 mg Oral Q breakfast  . sodium chloride  3 mL Intravenous Q12H  . sucralfate  1 g Oral TID WC & HS  . tiotropium  18 mcg Inhalation Daily    Continuous Inpatient Infusions:   . pantoprozole (PROTONIX) infusion 8 mg/hr (10/28/15 0827)    PRN Inpatient Medications:  acetaminophen, albuterol, ALPRAZolam, guaiFENesin, HYDROcodone-acetaminophen, menthol-cetylpyridinium, zolpidem  Miscellaneous:   Assessment:  1. Occult positive stool, possible melena with black stools in the setting of history of chronic anemia/iron deficiency. Patient with history several years ago of AVMs that were treated. Most recent EGD and colonoscopy showed no evidence of recurrent lesions. It is possible that he has these in the small intestine. Also of note is his large hiatal hernia which could cause GI blood loss from intermittent [place] erosions. He is hemodynamically stable. Patient with marked desaturation event on sedation yesterday for EGD. Procedure held for today noted to have some poor saturations.  Plan:  1. We will arrange for EGD with probable intubation. This procedure will be done Friday. Patient is quite insistent that he have this procedure. He understands that he is high risk. I have reiterated that he should avoid aspirin or NSAID products at all times. Agree with current management of proton pump inhibitor and change to IV twice a day tomorrow. Case discussed with Dr. Wyline Beady MD 10/28/2015, 4:37 PM

## 2015-10-28 NOTE — Plan of Care (Signed)
Problem: Education: Goal: Knowledge of [facility] Education information/materials will improve Outcome: Progressing Pt is alert to self, responds to painful stimuli. Pt reoriented and repositioned.  Continues to receive 0.45% NS at 50 ml/hr. NPO until speech evaluation. Pt is calm, cooperative. Answers some questions, mostly quiet and does not engage in conversation.  Problem: Health Behavior/Discharge Planning: Goal: Ability to manage health-related needs will improve Outcome: Progressing High Fall Risk, hourly rounding. Pt has a foley catheter, incontinent of stool.

## 2015-10-29 ENCOUNTER — Encounter: Payer: Self-pay | Admitting: Gastroenterology

## 2015-10-29 LAB — GLUCOSE, CAPILLARY
GLUCOSE-CAPILLARY: 215 mg/dL — AB (ref 65–99)
GLUCOSE-CAPILLARY: 329 mg/dL — AB (ref 65–99)
Glucose-Capillary: 226 mg/dL — ABNORMAL HIGH (ref 65–99)
Glucose-Capillary: 403 mg/dL — ABNORMAL HIGH (ref 65–99)
Glucose-Capillary: 454 mg/dL — ABNORMAL HIGH (ref 65–99)

## 2015-10-29 LAB — HEMOGLOBIN: HEMOGLOBIN: 8.9 g/dL — AB (ref 13.0–18.0)

## 2015-10-29 MED ORDER — METHYLPREDNISOLONE SODIUM SUCC 125 MG IJ SOLR
60.0000 mg | Freq: Three times a day (TID) | INTRAMUSCULAR | Status: AC
  Administered 2015-10-29 – 2015-10-30 (×3): 60 mg via INTRAVENOUS
  Filled 2015-10-29 (×3): qty 2

## 2015-10-29 MED ORDER — IRON SUCROSE 20 MG/ML IV SOLN
400.0000 mg | Freq: Once | INTRAVENOUS | Status: AC
  Administered 2015-10-29: 14:00:00 400 mg via INTRAVENOUS
  Filled 2015-10-29: qty 20

## 2015-10-29 MED ORDER — POTASSIUM CHLORIDE CRYS ER 20 MEQ PO TBCR
40.0000 meq | EXTENDED_RELEASE_TABLET | Freq: Once | ORAL | Status: AC
  Administered 2015-10-29: 40 meq via ORAL
  Filled 2015-10-29: qty 2

## 2015-10-29 MED ORDER — FUROSEMIDE 10 MG/ML IJ SOLN
20.0000 mg | Freq: Once | INTRAMUSCULAR | Status: AC
  Administered 2015-10-29: 20 mg via INTRAVENOUS
  Filled 2015-10-29: qty 2

## 2015-10-29 NOTE — Plan of Care (Signed)
Problem: Education: Goal: Knowledge of [facility] Education information/materials will improve Outcome: Progressing Pt oriented to unit and knows how to contact RN via phone.  Problem: Safety: Goal: Ability to remain free from injury will improve Outcome: Progressing Bed alarm in use.  Pt calling before getting up.  Pt remains safe from injury this shift.  Problem: Health Behavior/Discharge Planning: Goal: Ability to manage health-related needs will improve Outcome: Progressing Discussed proper eating and DM.  Pt able to verbalize proper diet ie no fried foods, decrease soft drinks.  Pt reports he has stopped smoking.  Problem: Pain Managment: Goal: General experience of comfort will improve Outcome: Progressing Pt complains of cough and sore throat.  Provided warm drinks, cough drops and cough medication with some relief.  Problem: Fluid Volume: Goal: Ability to maintain a balanced intake and output will improve Outcome: Progressing Tolerating food and fluid without difficulty

## 2015-10-29 NOTE — Plan of Care (Signed)
Problem: Education: Goal: Knowledge of [facility] Education information/materials will improve Outcome: Progressing UNDERSTANDS INFO GIVEN  Problem: Safety: Goal: Ability to remain free from injury will improve Outcome: Progressing Free from injury  Problem: Health Behavior/Discharge Planning: Goal: Ability to manage health-related needs will improve Outcome: Progressing Calmer xanax  X 2  Problem: Pain Managment: Goal: General experience of comfort will improve Outcome: Progressing No pain med required  Problem: Fluid Volume: Goal: Ability to maintain a balanced intake and output will improve Outcome: Progressing tol liquids well

## 2015-10-29 NOTE — Progress Notes (Signed)
Patient ID: [name], male   DOB: 1941-07-13, 75 y.o.   MRN: [id number] [facility] PROGRESS NOTE  [name] [id number] DOB: 04/02/1941 DOA: 10/25/2015 PCP: [name], MD  HPI/Subjective: Patient is confident in [place] doing the right thing for him. He states that he is a little short of breath. He takes some nebulizers and he feels better. No bowel movement as of earlier.  Objective: Filed Vitals:   10/29/15 0522 10/29/15 0844  BP: 126/71 137/81  Pulse: 90 78  Temp: 97.9 F (36.6 C) 98.3 F (36.8 C)  Resp: 22 20    Filed Weights   10/25/15 2203  Weight: 120.203 kg (265 lb)    ROS: Review of Systems  Constitutional: Negative for fever and chills.  Eyes: Negative for blurred vision.  Respiratory: Positive for cough and shortness of breath.   Cardiovascular: Negative for chest pain.  Gastrointestinal: Negative for nausea, vomiting, abdominal pain, diarrhea, constipation and melena.  Genitourinary: Negative for dysuria.  Musculoskeletal: Negative for joint pain.  Neurological: Negative for dizziness and headaches.   Exam: Physical Exam  Constitutional: He is oriented to person, place, and time.  HENT:  Right Ear: Tympanic membrane normal.  Left Ear: Tympanic membrane normal.  Nose: No mucosal edema.  Mouth/Throat: No oropharyngeal exudate or posterior oropharyngeal edema.  Eyes: Conjunctivae, EOM and lids are normal. Pupils are equal, round, and reactive to light.  Neck: Neck supple. No JVD present. Carotid bruit is not present. No tracheal deviation and no edema present. No thyroid mass and no thyromegaly present.  Cardiovascular: Regular rhythm, S1 normal, S2 normal and normal heart sounds.  Exam reveals no gallop.   No murmur heard. Pulses:      Carotid pulses are 2+ on the right side, and 2+ on the left side.      Dorsalis pedis pulses are 2+ on the right side, and 2+ on the left side.  Respiratory: No accessory muscle usage.  No respiratory distress. He has decreased breath sounds in the right middle field, the right lower field, the left middle field and the left lower field. He has wheezes in the right lower field. He has no rhonchi. He has no rales.  GI: Soft. Bowel sounds are normal. He exhibits no distension. There is no hepatosplenomegaly. There is no tenderness. There is no CVA tenderness.  Musculoskeletal:       Right ankle: He exhibits swelling.       Left ankle: He exhibits swelling.  Lymphadenopathy:    He has no cervical adenopathy.    He has no axillary adenopathy.  Neurological: He is alert and oriented to person, place, and time. He has normal strength. No cranial nerve deficit or sensory deficit.  Reflex Scores:      Patellar reflexes are 2+ on the right side and 2+ on the left side. Skin: Skin is warm. No rash noted. Nails show no clubbing.  Psychiatric: He has a normal mood and affect.    Data Reviewed: Basic Metabolic Panel:  Recent Labs Lab 10/25/15 2313 10/26/15 0839 10/27/15 1308  NA 139 133*  --   K 3.6 2.8* 3.3*  CL 100* 98*  --   CO2 28 28  --   GLUCOSE 369* 202*  --   BUN 50* 43*  --   CREATININE 1.42* 1.29*  --   CALCIUM 8.6* 7.9*  --    Liver Function Tests:  Recent Labs Lab 10/25/15 2313  AST 16  ALT  14*  ALKPHOS 79  BILITOT 0.5  PROT 6.0*  ALBUMIN 3.3*  CBC:  Recent Labs Lab 10/25/15 2313 10/26/15 0839 10/27/15 0458 10/28/15 0846 10/29/15 0749  WBC 12.8* 10.3 8.3 7.0  --   NEUTROABS 11.2*  --  6.4 5.2  --   HGB 6.9* 7.5* 9.1* 7.7* 8.9*  HCT 21.9* 22.2* 27.5* 24.2*  --   MCV 91.8 89.5 88.7 89.5  --   PLT 213 174 185 164  --    Cardiac Enzymes:  Recent Labs Lab 10/25/15 2313  TROPONINI <0.03   BNP (last 3 results)  Recent Labs  05/06/15 1354  BNP 181.0*    CBG:  Recent Labs Lab 10/28/15 1140 10/28/15 1624 10/28/15 2134 10/29/15 0905 10/29/15 1122  GLUCAP 250* 211* 168* 215* 226*    Scheduled Meds: . atorvastatin  40 mg Oral  QHS  . budesonide-formoterol  2 puff Inhalation BID  . citalopram  40 mg Oral Daily  . doxycycline  100 mg Oral BID  . ferrous sulfate  650 mg Oral TID WC  . furosemide  20 mg Intravenous Once  . gabapentin  100 mg Oral TID  . insulin aspart  0-15 Units Subcutaneous TID WC  . insulin glargine  18 Units Subcutaneous QHS  . iron sucrose  400 mg Intravenous Once  . methylPREDNISolone (SOLU-MEDROL) injection  60 mg Intravenous Q8H  . metoprolol tartrate  25 mg Oral BID  . pantoprazole (PROTONIX) IV  40 mg Intravenous Q12H  . potassium chloride  40 mEq Oral Once  . sodium chloride  3 mL Intravenous Q12H  . sucralfate  1 g Oral TID WC & HS  . tiotropium  18 mcg Inhalation Daily    Assessment/Plan:  1. Acute on chronic blood loss anemia. The patient has had numerous episodes of bleeding in the past and had numerous procedures. EGD Friday with anesthesia intubating the patient. Hemoglobin up today 8.9. I will give IV vendor for today. 2. Chronic respiratory failure with COPD. stress dose steroids today and prior to procedure and doxycycline for COPD exacerbation. Patient willing to be intubated for endoscopy.  3. Type 2 diabetes mellitus- sliding scale and Lantus insulin. Sugars will be higher on IV steroids. 4. Hyperlipidemia unspecified continue atorvastatin 5. Diabetic neuropathy on gabapentin 6. Give 1 dose of IV Lasix today with a blood transfusion yesterday. 7. Hyperlipidemia unspecified continue atorvastatin 8. Diabetic neuropathy on gabapentin 9. Hypokalemia replace potassium with IV Lasix today.  Code Status:     Code Status Orders        Start     Ordered   10/26/15 0315  Full code   Continuous     10/26/15 0314    Code Status History    Date Active Date Inactive Code Status Order ID Comments User Context   08/20/2015  4:41 AM 08/22/2015  4:49 PM Full Code [id number]  Lytle Butte, MD ED   08/20/2015  2:54 AM 08/20/2015  4:41 AM Full Code [id number]  Lytle Butte, MD ED    07/27/2015  8:40 PM 07/29/2015  6:29 PM Full Code [id number]  Lytle Butte, MD ED   06/30/2015 12:40 PM 07/03/2015  3:26 PM Full Code [id number]  Bettey Costa, MD Inpatient   05/19/2015  2:59 PM 05/23/2015  4:21 PM Full Code [id number]  Max Sane, MD Inpatient   05/06/2015  5:14 PM 05/13/2015  5:06 PM Full Code [id number]  Epifanio Lesches, MD ED   05/03/2015 12:29 PM 05/04/2015  7:20 PM Full Code [id number]  Idelle Crouch, MD Inpatient   04/22/2015  4:50 AM 04/27/2015  7:35 PM Full Code [id number]  Harrie Foreman, MD Inpatient   04/17/2015  7:16 PM 04/19/2015  5:16 PM Full Code [id number]  Clovis Fredrickson, MD Inpatient   04/10/2015  9:12 AM 04/17/2015  7:08 PM Full Code [id number]  Demetrios Loll, MD Inpatient   03/09/2015 12:18 PM 03/12/2015  6:45 PM Full Code [id number]  Henreitta Leber, MD Inpatient   08/24/2014  9:41 PM 08/25/2014  5:20 PM Full Code [id number]  Ivor Costa, MD ED   04/02/2014  8:11 PM 04/04/2014  5:10 PM Full Code [id number]  Velvet Bathe, MD Inpatient   03/18/2014  3:44 PM 03/22/2014  2:06 PM Full Code [id number]  Verlee Monte, MD Inpatient   12/04/2013  6:13 AM 12/10/2013  7:54 PM Full Code [id number]  Rise Patience, MD Inpatient   10/15/2013  8:11 PM 10/18/2013  6:40 PM Full Code [id number]  Fuller Plan, MD Inpatient   09/08/2013 12:43 AM 09/10/2013  3:30 PM Full Code [id number]  Toy Baker, MD Inpatient   08/19/2013  5:48 PM 08/22/2013  3:51 PM Full Code [id number]  Orson Eva, MD ED   05/15/2013  1:26 AM 05/17/2013  4:59 PM Full Code [id number]  Theodis Blaze, MD Inpatient   12/04/2012  5:23 PM 12/08/2012  4:56 PM Full Code [id number]  Janece Canterbury, MD Inpatient   05/17/2012 12:56 PM 05/21/2012  6:09 PM Full Code [id number]  Campbell Lerner, RN ED   04/06/2012  7:35 PM 04/14/2012  5:23 PM Full Code [id number]  Marylou Mccoy, RN Inpatient   03/27/2012  4:44 AM 03/28/2012  5:56 PM Full Code [id number]  Theotis Barrio, RN Inpatient    Advance Directive Documentation        Most Recent Value   Type of Advance  Directive  Healthcare Power of Attorney   Pre-existing out of facility DNR order (yellow form or pink MOST form)     "MOST" Form in Place?       Disposition Plan: To be determined  Consultants:  gastroenterolgy  Time spent: 20 minutes  Loletha Grayer  [facility]

## 2015-10-29 NOTE — Consult Note (Signed)
Subjective: Patient seen for  Acute on chronic anemia and heme positive stool.  Hemodynamically stable, appropriate response to tfx.  Tolerating po.  No bm since yesterday noon.  He feels his breathing is a little better.   Objective: Vital signs in last 24 hours: Temp:  [97.7 F (36.5 C)-98.3 F (36.8 C)] 98.3 F (36.8 C) (01/12 0844) Pulse Rate:  [66-99] 78 (01/12 0844) Resp:  [20-22] 20 (01/12 0844) BP: (118-137)/(68-81) 137/81 mmHg (01/12 0844) SpO2:  [95 %-99 %] 96 % (01/12 1002) Blood pressure 137/81, pulse 78, temperature 98.3 F (36.8 C), temperature source Oral, resp. rate 20, height 6\' 3"  (1.905 m), weight 120.203 kg (265 lb), SpO2 96 %.   Intake/Output from previous day: 01/11 0701 - 01/12 0700 In: 596 [P.O.:240; I.V.:24; Blood:332] Out: 2150 [Urine:2150]  Intake/Output this shift: Total I/O In: 240 [P.O.:240] Out: 450 [Urine:450]   General appearance:  Elderly nad Resp:  Coarse wheeze bilateral Cardio:  rrr GI:  Protuberant nontender, bs positive  Extremities:  No cce   Lab Results: Results for orders placed or performed during the hospital encounter of 10/25/15 (from the past 24 hour(s))  Glucose, capillary     Status: Abnormal   Collection Time: 10/28/15  4:24 PM  Result Value Ref Range   Glucose-Capillary 211 (H) 65 - 99 mg/dL   Comment 1 Notify RN   Glucose, capillary     Status: Abnormal   Collection Time: 10/28/15  9:34 PM  Result Value Ref Range   Glucose-Capillary 168 (H) 65 - 99 mg/dL  Hemoglobin     Status: Abnormal   Collection Time: 10/29/15  7:49 AM  Result Value Ref Range   Hemoglobin 8.9 (L) 13.0 - 18.0 g/dL  Glucose, capillary     Status: Abnormal   Collection Time: 10/29/15  9:05 AM  Result Value Ref Range   Glucose-Capillary 215 (H) 65 - 99 mg/dL   Comment 1 Notify RN   Glucose, capillary     Status: Abnormal   Collection Time: 10/29/15 11:22 AM  Result Value Ref Range   Glucose-Capillary 226 (H) 65 - 99 mg/dL   Comment 1 Notify  RN       Recent Labs  10/27/15 0458 10/28/15 0846 10/29/15 0749  WBC 8.3 7.0  --   HGB 9.1* 7.7* 8.9*  HCT 27.5* 24.2*  --   PLT 185 164  --    BMET  Recent Labs  10/27/15 1308  K 3.3*   LFT No results for input(s): PROT, ALBUMIN, AST, ALT, ALKPHOS, BILITOT, BILIDIR, IBILI in the last 72 hours. PT/INR  Recent Labs  10/27/15 0458  LABPROT 13.5  INR 1.01   Hepatitis Panel No results for input(s): HEPBSAG, HCVAB, HEPAIGM, HEPBIGM in the last 72 hours. C-Diff No results for input(s): CDIFFTOX in the last 72 hours. No results for input(s): CDIFFPCR in the last 72 hours.   Studies/Results: No results found.  Scheduled Inpatient Medications:   . atorvastatin  40 mg Oral QHS  . budesonide-formoterol  2 puff Inhalation BID  . citalopram  40 mg Oral Daily  . doxycycline  100 mg Oral BID  . ferrous sulfate  650 mg Oral TID WC  . furosemide  20 mg Intravenous Once  . gabapentin  100 mg Oral TID  . insulin aspart  0-15 Units Subcutaneous TID WC  . insulin glargine  18 Units Subcutaneous QHS  . iron sucrose  400 mg Intravenous Once  . methylPREDNISolone (SOLU-MEDROL) injection  60 mg  Intravenous Q8H  . metoprolol tartrate  25 mg Oral BID  . pantoprazole (PROTONIX) IV  40 mg Intravenous Q12H  . potassium chloride  40 mEq Oral Once  . sodium chloride  3 mL Intravenous Q12H  . sucralfate  1 g Oral TID WC & HS  . tiotropium  18 mcg Inhalation Daily    Continuous Inpatient Infusions:     PRN Inpatient Medications:  acetaminophen, albuterol, ALPRAZolam, guaiFENesin, HYDROcodone-acetaminophen, menthol-cetylpyridinium, zolpidem  Miscellaneous:   Assessment:  1) acute on chronic gi blood loss anemia.  Hemodynamically stable . No evicdence of acute bleeding ongoing.   Plan:  1) for egd tomorrow with anesthesia assistance. Will d/w anesthesia again before case. Agree with optimizing respiratory status per IM.  Continue current.   Lollie Sails MD 10/29/2015,  1:42 PM

## 2015-10-30 ENCOUNTER — Encounter: Payer: Self-pay | Admitting: Anesthesiology

## 2015-10-30 ENCOUNTER — Encounter
Admission: EM | Disposition: A | Discharge status: Home Health | Payer: Self-pay | Source: Home / Self Care | Attending: Internal Medicine

## 2015-10-30 ENCOUNTER — Inpatient Hospital Stay: Payer: Medicare HMO

## 2015-10-30 HISTORY — PX: ESOPHAGOGASTRODUODENOSCOPY (EGD) WITH PROPOFOL: SHX5813

## 2015-10-30 LAB — TYPE AND SCREEN
ABO/RH(D): B NEG
Antibody Screen: NEGATIVE
UNIT DIVISION: 0
UNIT DIVISION: 0
Unit division: 0
Unit division: 0

## 2015-10-30 LAB — GLUCOSE, CAPILLARY
GLUCOSE-CAPILLARY: 275 mg/dL — AB (ref 65–99)
GLUCOSE-CAPILLARY: 210 mg/dL — AB (ref 65–99)
GLUCOSE-CAPILLARY: 340 mg/dL — AB (ref 65–99)
Glucose-Capillary: 406 mg/dL — ABNORMAL HIGH (ref 65–99)
Glucose-Capillary: 330 mg/dL — ABNORMAL HIGH (ref 65–99)

## 2015-10-30 LAB — POTASSIUM: POTASSIUM: 4 mmol/L (ref 3.5–5.1)

## 2015-10-30 LAB — HEMOGLOBIN: HEMOGLOBIN: 9.6 g/dL — AB (ref 13.0–18.0)

## 2015-10-30 SURGERY — ESOPHAGOGASTRODUODENOSCOPY (EGD) WITH PROPOFOL
Anesthesia: General

## 2015-10-30 MED ORDER — INSULIN ASPART 100 UNIT/ML ~~LOC~~ SOLN
0.0000 [IU] | Freq: Three times a day (TID) | SUBCUTANEOUS | Status: DC
  Administered 2015-10-30 – 2015-10-31 (×3): 5 [IU] via SUBCUTANEOUS
  Administered 2015-10-31: 3 [IU] via SUBCUTANEOUS
  Administered 2015-11-01: 08:00:00 5 [IU] via SUBCUTANEOUS
  Administered 2015-11-01: 15 [IU] via SUBCUTANEOUS
  Filled 2015-10-30: qty 3
  Filled 2015-10-30: qty 5
  Filled 2015-10-30: qty 10
  Filled 2015-10-30 (×3): qty 5

## 2015-10-30 MED ORDER — PREDNISOLONE 5 MG PO TABS
30.0000 mg | ORAL_TABLET | Freq: Every day | ORAL | Status: DC
  Filled 2015-10-30: qty 6

## 2015-10-30 MED ORDER — SODIUM CHLORIDE 0.9 % IV SOLN
400.0000 mg | Freq: Once | INTRAVENOUS | Status: AC
  Administered 2015-10-31: 09:00:00 400 mg via INTRAVENOUS
  Filled 2015-10-30: qty 20

## 2015-10-30 MED ORDER — INSULIN GLARGINE 100 UNIT/ML ~~LOC~~ SOLN
25.0000 [IU] | Freq: Every day | SUBCUTANEOUS | Status: DC
  Administered 2015-10-30 – 2015-10-31 (×2): 25 [IU] via SUBCUTANEOUS
  Filled 2015-10-30 (×4): qty 0.25

## 2015-10-30 MED ORDER — INSULIN ASPART 100 UNIT/ML ~~LOC~~ SOLN
0.0000 [IU] | Freq: Every day | SUBCUTANEOUS | Status: DC
  Administered 2015-10-30: 4 [IU] via SUBCUTANEOUS
  Administered 2015-10-31: 5 [IU] via SUBCUTANEOUS
  Filled 2015-10-30: qty 5
  Filled 2015-10-30: qty 4

## 2015-10-30 NOTE — Plan of Care (Signed)
Problem: Fluid Volume: Goal: Ability to maintain a balanced intake and output will improve Outcome: Progressing Pt reports his MD told him he could take another xanax right after the first one and did not understand why one could not be given less than 4 hours after the last dose. Pt reports pain but refused pain meds. Pt has been resting comfortably during shift. Pt keep repeating he is going to die from the EGD he has this am and he would like to get a spite and vanilla ice cream. When an attempt was made to grant pt wishes, he refuse the ice-cream and lemon lime because the food did not come at the time he expected. Pt blood sugar was 454 and recheck was 406. Dr. Lavetta Nielsen is aware. No new orders were received. No other signs of distress noted. Will continue to monitor.

## 2015-10-30 NOTE — Progress Notes (Addendum)
Inpatient Diabetes Program Recommendations  AACE/ADA: New Consensus Statement on Inpatient Glycemic Control (2015)  Target Ranges:  Prepandial:   less than 140 mg/dL      Peak postprandial:   less than 180 mg/dL (1-2 hours)      Critically ill patients:  140 - 180 mg/dL   Review of Glycemic Control  Results for JEVONTE, SUNG (MRN [id number]) as of 10/30/2015 13:19  Ref. Range 10/29/2015 09:05 10/29/2015 11:22 10/29/2015 16:37 10/29/2015 22:06 10/29/2015 22:08 10/30/2015 01:33 10/30/2015 08:07 10/30/2015 11:30  Glucose-Capillary Latest Ref Range: 65-99 mg/dL 215 (H) 226 (H) 329 (H) 403 (H) 454 (H) 406 (H) 330 (H) 275 (H)   Diabetes history: Type 2 Outpatient Diabetes medications: Novolog 10 units tid, Lantus 20 units bid Current orders for Inpatient glycemic control: Lantus 18 units qday, Novolog 0-15 units tid  Inpatient Diabetes Program Recommendations: Please change diet to carb modified/ soft diet  Please consider increasing Lantus to 25 units qday (fasting blood sugars elevated).  Please consider adding Novolog correction 0-5 units qhs.   Patient takes Novolog insulin 3 times per day at home- when he is eating a full diet, consider adding mealtime Novolog 5 units tid and continue Novolog correction as ordered.   Gentry Fitz, RN, BA, MHA, CDE Diabetes Coordinator Inpatient Diabetes Program  [phone] (Team Pager) [phone] ([place]) 10/30/2015 1:25 PM

## 2015-10-30 NOTE — Plan of Care (Signed)
Problem: Health Behavior/Discharge Planning: Goal: Ability to manage health-related needs will improve Outcome: Not Progressing Periods of belergerance / cursing alt with calmness / cooperation / pleasant. Chaplin saw today

## 2015-10-30 NOTE — Progress Notes (Signed)
   10/30/15 1000  Clinical Encounter Type  Visited With Patient  Visit Type Spiritual support  Referral From Nurse  Consult/Referral To Chaplain  Spiritual Encounters  Spiritual Needs Emotional  Stress Factors  Patient Stress Factors Family relationships;Health changes  Met w/patient to provide compassionate presence & pastoral care. Will follow-up after procedure per pt. request.  Chap. Lizza Huffaker [place]

## 2015-10-30 NOTE — Consult Note (Addendum)
I have seen Mr Pund several times over the course of the day and discussed him with anesthesiology on several occasions. Mutliple factors place him at  very high risk for for sedated procedure at this time-recent copd exacerbation, failure at attempted propafol anesthesia requiring LMA ventilation,  Severe baseline emphysema.  Risk for possible need of prolonged mechanical ventilation is substantial should he not wean appropriately on procedure.   In terms of his problem with anemia, patient will need to have a specific management for his issue of chronic GI blood loss as follows: 1) bid ppi use 2) CARAFATE1 g 4 times a day.  3) recommend IV iron treatment in regards to the IDA, as he may not be absorbing po iron adequately to keep up with losses.  4) would consider a week of respiratory treatments for COPD exacerbation to attempt to optimized status for repeat attempt of sedation.  5) If patient is d/c over the weekend, recommend GI o/p fu with me on Thursday to recheck  Hemogram. 6) ABSOLUTELY NO NSAIDS.    I feel he will become increasingly at marked risk with time and management will need to be proactive in terms of serial outpatient hemograms with tfx as needed rather than patient waiting until symptomatic anemia develops and presents to ER in severe difficulty. This can be done but will require cooperation of the patient to a high degree.    I have discussed the above with Mr Kusner and he is in agreement.   Discussed with Dr Earleen Newport. Dr [name] covering the  Weekend.

## 2015-10-30 NOTE — Care Management Important Message (Signed)
Important Message  Patient Details  Name: [name] MRN: [id number] Date of Birth: 25-Apr-1941   Medicare Important Message Given:  Yes    Juliann Pulse A Annaliza Zia 10/30/2015, 9:29 AM

## 2015-10-30 NOTE — Progress Notes (Signed)
   10/30/15 1900  Clinical Encounter Type  Visited With Patient  Visit Type Follow-up;Spiritual support  Consult/Referral To Chaplain  Spiritual Encounters  Spiritual Needs Emotional  Stress Factors  Patient Stress Factors Health changes  Follow up visit w/patient. Provided pastoral support and comfort. Chap. Bennett Ram G. Aber[place]

## 2015-10-30 NOTE — Progress Notes (Signed)
Patient ID: [name], male   DOB: [birth year], 75 y.o.   MRN: [id number] [name] PROGRESS NOTE  [name] [id number] DOB: 10-19-1940 DOA: 10/25/2015 PCP: [name], MD  HPI/Subjective: Patient seen in conjunction with Dr. Gustavo Lah earlier today. He stated he had a confrontation with his wife and he is unsure whether he can go home. I spoke with his wife and of course he can go home. His wife states that he was asked he to her today and she left. Speaking with his wife with his permission, I mentioned that the key to this is following up to check his blood counts. She stated that he is difficult to get to appointments.  Objective: Filed Vitals:   10/30/15 1003 10/30/15 1358  BP: 118/52 132/74  Pulse: 68 61  Temp: 98.3 F (36.8 C) 98.4 F (36.9 C)  Resp: 20     Filed Weights   10/25/15 2203  Weight: 120.203 kg (265 lb)    ROS: Review of Systems  Constitutional: Negative for fever and chills.  Eyes: Negative for blurred vision.  Respiratory: Positive for cough and shortness of breath.   Cardiovascular: Negative for chest pain.  Gastrointestinal: Negative for nausea, vomiting, abdominal pain, diarrhea, constipation and melena.  Genitourinary: Negative for dysuria.  Musculoskeletal: Negative for joint pain.  Neurological: Negative for dizziness and headaches.   Exam: Physical Exam  Constitutional: He is oriented to person, place, and time.  HENT:  Right Ear: Tympanic membrane normal.  Left Ear: Tympanic membrane normal.  Nose: No mucosal edema.  Mouth/Throat: No oropharyngeal exudate or posterior oropharyngeal edema.  Eyes: Conjunctivae, EOM and lids are normal. Pupils are equal, round, and reactive to light.  Neck: Neck supple. No JVD present. Carotid bruit is not present. No tracheal deviation and no edema present. No thyroid mass and no thyromegaly present.  Cardiovascular: Regular rhythm, S1 normal, S2 normal and normal heart  sounds.  Exam reveals no gallop.   No murmur heard. Pulses:      Carotid pulses are 2+ on the right side, and 2+ on the left side.      Dorsalis pedis pulses are 2+ on the right side, and 2+ on the left side.  Respiratory: No accessory muscle usage. No respiratory distress. He has decreased breath sounds in the right middle field, the right lower field, the left middle field and the left lower field. He has wheezes in the right lower field. He has no rhonchi. He has no rales.  GI: Soft. Bowel sounds are normal. He exhibits no distension. There is no hepatosplenomegaly. There is no tenderness. There is no CVA tenderness.  Musculoskeletal:       Right ankle: He exhibits swelling.       Left ankle: He exhibits swelling.  Lymphadenopathy:    He has no cervical adenopathy.    He has no axillary adenopathy.  Neurological: He is alert and oriented to person, place, and time. He has normal strength. No cranial nerve deficit or sensory deficit.  Reflex Scores:      Patellar reflexes are 2+ on the right side and 2+ on the left side. Skin: Skin is warm. No rash noted. Nails show no clubbing.  Psychiatric: He has a normal mood and affect.    Data Reviewed: Basic Metabolic Panel:  Recent Labs Lab 10/25/15 2313 10/26/15 0839 10/27/15 1308 10/30/15 0454  NA 139 133*  --   --   K 3.6 2.8* 3.3* 4.0  CL  100* 98*  --   --   CO2 28 28  --   --   GLUCOSE 369* 202*  --   --   BUN 50* 43*  --   --   CREATININE 1.42* 1.29*  --   --   CALCIUM 8.6* 7.9*  --   --    Liver Function Tests:  Recent Labs Lab 10/25/15 2313  AST 16  ALT 14*  ALKPHOS 79  BILITOT 0.5  PROT 6.0*  ALBUMIN 3.3*  CBC:  Recent Labs Lab 10/25/15 2313 10/26/15 0839 10/27/15 0458 10/28/15 0846 10/29/15 0749 10/30/15 0454  WBC 12.8* 10.3 8.3 7.0  --   --   NEUTROABS 11.2*  --  6.4 5.2  --   --   HGB 6.9* 7.5* 9.1* 7.7* 8.9* 9.6*  HCT 21.9* 22.2* 27.5* 24.2*  --   --   MCV 91.8 89.5 88.7 89.5  --   --   PLT 213  174 185 164  --   --    Cardiac Enzymes:  Recent Labs Lab 10/25/15 2313  TROPONINI <0.03   BNP (last 3 results)  Recent Labs  05/06/15 1354  BNP 181.0*    CBG:  Recent Labs Lab 10/29/15 2206 10/29/15 2208 10/30/15 0133 10/30/15 0807 10/30/15 1130  GLUCAP 403* 454* 406* 330* 275*    Scheduled Meds: . atorvastatin  40 mg Oral QHS  . budesonide-formoterol  2 puff Inhalation BID  . citalopram  40 mg Oral Daily  . doxycycline  100 mg Oral BID  . ferrous sulfate  650 mg Oral TID WC  . gabapentin  100 mg Oral TID  . insulin aspart  0-15 Units Subcutaneous TID WC  . insulin aspart  0-5 Units Subcutaneous QHS  . insulin glargine  25 Units Subcutaneous QHS  . metoprolol tartrate  25 mg Oral BID  . pantoprazole (PROTONIX) IV  40 mg Intravenous Q12H  . [START ON 10/31/2015] prednisoLONE  30 mg Oral Daily  . sodium chloride  3 mL Intravenous Q12H  . sucralfate  1 g Oral TID WC & HS  . tiotropium  18 mcg Inhalation Daily    Assessment/Plan:  1. Acute on chronic blood loss anemia. The patient has had numerous episodes of bleeding in the past and had numerous procedures. GI and anesthesia have canceled procedure. Hemoglobin stable today at 9.6. I will give another dose of IV vendor for tomorrow morning. 2. Chronic respiratory failure with COPD. switch back to prednisone taper and continue doxycycline.  3. Type 2 diabetes mellitus- sliding scale and Lantus insulin with increased dose 4. Hyperlipidemia unspecified continue atorvastatin 5. Diabetic neuropathy on gabapentin 6. Hyperlipidemia unspecified continue atorvastatin 7. Diabetic neuropathy on gabapentin 8. Hypokalemia replaced  Code Status:     Code Status Orders        Start     Ordered   10/26/15 0315  Full code   Continuous     10/26/15 0314    Code Status History    Date Active Date Inactive Code Status Order ID Comments User Context   08/20/2015  4:41 AM 08/22/2015  4:49 PM Full Code [id number]  Lytle Butte, MD ED   08/20/2015  2:54 AM 08/20/2015  4:41 AM Full Code [id number]  Lytle Butte, MD ED   07/27/2015  8:40 PM 07/29/2015  6:29 PM Full Code [id number]  Lytle Butte, MD ED   06/30/2015 12:40 PM 07/03/2015  3:26 PM Full Code [id number]  Sital  Benjie Karvonen, MD Inpatient   05/19/2015  2:59 PM 05/23/2015  4:21 PM Full Code [id number]  Max Sane, MD Inpatient   05/06/2015  5:14 PM 05/13/2015  5:06 PM Full Code [id number]  Epifanio Lesches, MD ED   05/03/2015 12:29 PM 05/04/2015  7:20 PM Full Code [id number]  Idelle Crouch, MD Inpatient   04/22/2015  4:50 AM 04/27/2015  7:35 PM Full Code [id number]  Harrie Foreman, MD Inpatient   04/17/2015  7:16 PM 04/19/2015  5:16 PM Full Code [id number]  Clovis Fredrickson, MD Inpatient   04/10/2015  9:12 AM 04/17/2015  7:08 PM Full Code [id number]  Demetrios Loll, MD Inpatient   03/09/2015 12:18 PM 03/12/2015  6:45 PM Full Code [id number]  Henreitta Leber, MD Inpatient   08/24/2014  9:41 PM 08/25/2014  5:20 PM Full Code [id number]  Ivor Costa, MD ED   04/02/2014  8:11 PM 04/04/2014  5:10 PM Full Code [id number]  Velvet Bathe, MD Inpatient   03/18/2014  3:44 PM 03/22/2014  2:06 PM Full Code [id number]  Verlee Monte, MD Inpatient   12/04/2013  6:13 AM 12/10/2013  7:54 PM Full Code [id number]  Rise Patience, MD Inpatient   10/15/2013  8:11 PM 10/18/2013  6:40 PM Full Code [id number]  Fuller Plan, MD Inpatient   09/08/2013 12:43 AM 09/10/2013  3:30 PM Full Code [id number]  Toy Baker, MD Inpatient   08/19/2013  5:48 PM 08/22/2013  3:51 PM Full Code [id number]  Orson Eva, MD ED   05/15/2013  1:26 AM 05/17/2013  4:59 PM Full Code [id number]  Theodis Blaze, MD Inpatient   12/04/2012  5:23 PM 12/08/2012  4:56 PM Full Code [id number]  Janece Canterbury, MD Inpatient   05/17/2012 12:56 PM 05/21/2012  6:09 PM Full Code [id number]  Campbell Lerner, RN ED   04/06/2012  7:35 PM 04/14/2012  5:23 PM Full Code [id number]  Marylou Mccoy, RN Inpatient   03/27/2012  4:44 AM 03/28/2012  5:56 PM Full Code [id number]  Theotis Barrio, RN Inpatient    Advance Directive Documentation        Most Recent Value   Type of Advance Directive  Healthcare Power of Attorney   Pre-existing out of facility DNR order (yellow form or pink MOST form)     "MOST" Form in Place?       Disposition Plan: Potentially home over the weekend. Case discussed with his wife on the phone with the patient's permission. Numerous conversations with Dr. Gustavo Lah gastroenterology today.  Consultants:  gastroenterolgy  Time spent: 20 minutes  Loletha Grayer  [facility]

## 2015-10-30 NOTE — Progress Notes (Signed)
Initial Nutrition Assessment   INTERVENTION:   Meals and Snacks: Cater to patient preferences on Low Residue, Carb modified diet order Medical Food Supplement Therapy: will recommend on follow if intake poor or diet order downgraded    NUTRITION DIAGNOSIS:   Inadequate oral intake related to acute illness as evidenced by  (pt diet order advanced from liquids day 3 of admission).  GOAL:   Patient will meet greater than or equal to 90% of their needs  MONITOR:    (Energy Intake, Digestive System, Anthropometrics, Anemia Profile)  REASON FOR ASSESSMENT:   LOS    ASSESSMENT:   Pt admitted with GI bleed and black stools; gastronenterology following. Unable to speak with pt times 2 today, as pt unavailable with other disciplines.  Past Medical History  Diagnosis Date  . Allergic rhinitis, cause unspecified   . Hyperlipidemia   . CAD (coronary artery disease)     a. 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  . Essential hypertension   . COPD (chronic obstructive pulmonary disease) ([place])     a. GOLD stage IV, started home O2. Severe bullous disease of LUL. Prolonged intubation after surgeries due to COPD.  Marland Kitchen AAA (abdominal aortic aneurysm) ([place])     a. 12/2008 s/p 7cm, endovascular repair with coiling right hypogastric artery   . Recurrent Microcytic Anemia     a. presumed chronic GI blood loss.  . Memory loss   . Type II diabetes mellitus ([place])   . Chronic diastolic CHF (congestive heart failure) ([place])     a. 04/2015 Echo: EF 55-60%, no rwma, Gr 1 DD, mild AI.  Marland Kitchen GI bleed requiring more than 4 units of blood in 24 hours, ICU, or surgery     a. Hx bleeding gastric polyps, cecal & sigmoid AVMS s/p APC 03/30/14  . AVM (arteriovenous malformation) of colon with hemorrhage   . Multiple gastric polyps   . Diverticulosis   . Essential hypertension 08/18/2009    Qualifier: Diagnosis of  By: Doy Mince LPN, Megan    . Anxiety state 09/10/2013  . Depression  01/14/2013  . Vitamin D deficiency 08/10/2014  . Leucocytosis 12/04/2013  . Insomnia 08/10/2014  . Bipolar 1 disorder, mixed, moderate ([place]) 04/16/2015  . Morbid obesity ([place])      Diet Order:  DIET SOFT Room service appropriate?: Yes; Fluid consistency:: Thin    Current Nutrition: Per chart, pt eating 100% of last 4 meals on soft diet order and tolerating well. Pt's diet order advanced 1/11 (a day and half ago).  Food/Nutrition-Related History: Per MST no decrease in appetite PTA.   Scheduled Medications:  . atorvastatin  40 mg Oral QHS  . budesonide-formoterol  2 puff Inhalation BID  . citalopram  40 mg Oral Daily  . doxycycline  100 mg Oral BID  . ferrous sulfate  650 mg Oral TID WC  . gabapentin  100 mg Oral TID  . insulin aspart  0-15 Units Subcutaneous TID WC  . insulin glargine  18 Units Subcutaneous QHS  . metoprolol tartrate  25 mg Oral BID  . pantoprazole (PROTONIX) IV  40 mg Intravenous Q12H  . sodium chloride  3 mL Intravenous Q12H  . sucralfate  1 g Oral TID WC & HS  . tiotropium  18 mcg Inhalation Daily     Electrolyte/Renal Profile and Glucose Profile:   Recent Labs Lab 10/25/15 2313 10/26/15 0839 10/27/15 1308 10/30/15 0454  NA 139 133*  --   --  K 3.6 2.8* 3.3* 4.0  CL 100* 98*  --   --   CO2 28 28  --   --   BUN 50* 43*  --   --   CREATININE 1.42* 1.29*  --   --   CALCIUM 8.6* 7.9*  --   --   GLUCOSE 369* 202*  --   --    Protein Profile:   Recent Labs Lab 10/25/15 2313  ALBUMIN 3.3*    Gastrointestinal Profile:  Last BM: 10/30/2015 large black stool per documentation   Weight Change: Per [facility] weight encounters weight has been relatively stable   Height:   Ht Readings from Last 1 Encounters:  10/25/15 6\' 3"  (1.905 m)    Weight:   Wt Readings from Last 1 Encounters:  10/25/15 265 lb (120.203 kg)   Wt Readings from Last 10 Encounters:  10/25/15 265 lb (120.203 kg)  09/17/15 268 lb (121.564 kg)  08/20/15 289 lb 3.2 oz (131.18  kg)  07/27/15 272 lb (123.378 kg)  07/03/15 271 lb 1.6 oz (122.97 kg)  05/28/15 264 lb (119.75 kg)  05/23/15 273 lb 1.6 oz (123.877 kg)  05/11/15 262 lb (118.842 kg)  05/04/15 266 lb 1.6 oz (120.702 kg)  04/27/15 260 lb 3.2 oz (118.026 kg)    BMI:  Body mass index is 33.12 kg/(m^2).   EDUCATION NEEDS:   No education needs identified at this time   [place], RD, LDN Pager [phone] Weekend/On-Call Pager [phone]

## 2015-10-30 NOTE — Consult Note (Addendum)
Subjective: Patient seen for anemia and GI blood loss. Patient is been stable overnight. He has no nausea or vomiting. He did have a bowel movement was described as being black late last night. He has been hemodynamically stable. He is quite upset currently as he just had a argument with his wife.  Objective: Vital signs in last 24 hours: Temp:  [97.6 F (36.4 C)-98.3 F (36.8 C)] 98.3 F (36.8 C) (01/13 1003) Pulse Rate:  [68-102] 68 (01/13 1003) Resp:  [18-22] 20 (01/13 1003) BP: (118-172)/(52-91) 118/52 mmHg (01/13 1003) SpO2:  [96 %-99 %] 97 % (01/13 1041) Blood pressure 118/52, pulse 68, temperature 98.3 F (36.8 C), temperature source Oral, resp. rate 20, height 6\' 3"  (1.905 m), weight 120.203 kg (265 lb), SpO2 97 %.   Intake/Output from previous day: 01/12 0701 - 01/13 0700 In: 360 [P.O.:360] Out: 3050 [Urine:3050]  Intake/Output this shift:     General appearance:  Elderly-appearing 75 year old male no acute distress Resp:  Coarse rhonchi left base clear otherwise. Cardio:  Regular rate and rhythm without rub or gallop GI:  Soft nontender nondistended bowel sounds positive normoactive Extremities:  No clubbing cyanosis or edema   Lab Results: Results for orders placed or performed during the hospital encounter of 10/25/15 (from the past 24 hour(s))  Glucose, capillary     Status: Abnormal   Collection Time: 10/29/15  4:37 PM  Result Value Ref Range   Glucose-Capillary 329 (H) 65 - 99 mg/dL   Comment 1 Notify RN   Glucose, capillary     Status: Abnormal   Collection Time: 10/29/15 10:06 PM  Result Value Ref Range   Glucose-Capillary 403 (H) 65 - 99 mg/dL  Glucose, capillary     Status: Abnormal   Collection Time: 10/29/15 10:08 PM  Result Value Ref Range   Glucose-Capillary 454 (H) 65 - 99 mg/dL  Glucose, capillary     Status: Abnormal   Collection Time: 10/30/15  1:33 AM  Result Value Ref Range   Glucose-Capillary 406 (H) 65 - 99 mg/dL  Hemoglobin      Status: Abnormal   Collection Time: 10/30/15  4:54 AM  Result Value Ref Range   Hemoglobin 9.6 (L) 13.0 - 18.0 g/dL  Potassium     Status: None   Collection Time: 10/30/15  4:54 AM  Result Value Ref Range   Potassium 4.0 3.5 - 5.1 mmol/L  Glucose, capillary     Status: Abnormal   Collection Time: 10/30/15  8:07 AM  Result Value Ref Range   Glucose-Capillary 330 (H) 65 - 99 mg/dL  Glucose, capillary     Status: Abnormal   Collection Time: 10/30/15 11:30 AM  Result Value Ref Range   Glucose-Capillary 275 (H) 65 - 99 mg/dL   Comment 1 Notify RN       Recent Labs  10/28/15 0846 10/29/15 0749 10/30/15 0454  WBC 7.0  --   --   HGB 7.7* 8.9* 9.6*  HCT 24.2*  --   --   PLT 164  --   --    BMET  Recent Labs  10/30/15 0454  K 4.0   LFT No results for input(s): PROT, ALBUMIN, AST, ALT, ALKPHOS, BILITOT, BILIDIR, IBILI in the last 72 hours. PT/INR No results for input(s): LABPROT, INR in the last 72 hours. Hepatitis Panel No results for input(s): HEPBSAG, HCVAB, HEPAIGM, HEPBIGM in the last 72 hours. C-Diff No results for input(s): CDIFFTOX in the last 72 hours. No results for input(s): CDIFFPCR  in the last 72 hours.   Studies/Results: No results found.  Scheduled Inpatient Medications:   . atorvastatin  40 mg Oral QHS  . budesonide-formoterol  2 puff Inhalation BID  . citalopram  40 mg Oral Daily  . doxycycline  100 mg Oral BID  . ferrous sulfate  650 mg Oral TID WC  . gabapentin  100 mg Oral TID  . insulin aspart  0-15 Units Subcutaneous TID WC  . insulin glargine  18 Units Subcutaneous QHS  . metoprolol tartrate  25 mg Oral BID  . pantoprazole (PROTONIX) IV  40 mg Intravenous Q12H  . sodium chloride  3 mL Intravenous Q12H  . sucralfate  1 g Oral TID WC & HS  . tiotropium  18 mcg Inhalation Daily    Continuous Inpatient Infusions:     PRN Inpatient Medications:  acetaminophen, albuterol, ALPRAZolam, guaiFENesin, HYDROcodone-acetaminophen,  menthol-cetylpyridinium, zolpidem  Miscellaneous:   Assessment:  1. Blood loss, acute on chronic. Patient with history of erosive gastritis gastric ulcers duodenal AVMs gastric polyps and colonic AVMs as well. He has had several black bowel movements since he has come in the hospital. This is likely very slow bleed however a bleed that has not been stopping. An able get his hemoglobin to improve transfusion.  Plan:  1. I have discussed this case with anesthesiology as well as internal medicine. Feel he is at high risk for sedated procedure however could lead to clear emergent. He is been hemodynamically stable and this is not an emergency procedure he is obviously having some blood loss and has had disease in the past that could account for the same problem. I have asked for stat chest films for comparison with previous. If they are improved May be able to proceed. Had a long discussion concerning all of this with patient as well as internal medicine.   I have discussed the risks benefits and complications of procedures to include not limited to bleeding, infection, perforation and the risk of sedation and the patient wishes to proceed.  Lollie Sails MD 10/30/2015, 1:25 PM  Trace heme positive on DRE.

## 2015-10-30 NOTE — Anesthesia Preprocedure Evaluation (Deleted)
Anesthesia Evaluation    Airway        Dental   Pulmonary former smoker,           Cardiovascular hypertension,      Neuro/Psych    GI/Hepatic   Endo/Other  diabetes  Renal/GU      Musculoskeletal   Abdominal   Peds  Hematology   Anesthesia Other Findings   Reproductive/Obstetrics                             Anesthesia Physical Anesthesia Plan Anesthesia Quick Evaluation  

## 2015-10-31 LAB — GLUCOSE, CAPILLARY
GLUCOSE-CAPILLARY: 183 mg/dL — AB (ref 65–99)
GLUCOSE-CAPILLARY: 363 mg/dL — AB (ref 65–99)
Glucose-Capillary: 238 mg/dL — ABNORMAL HIGH (ref 65–99)

## 2015-10-31 LAB — HEMOGLOBIN: HEMOGLOBIN: 9.7 g/dL — AB (ref 13.0–18.0)

## 2015-10-31 MED ORDER — PREDNISOLONE 5 MG PO TABS
40.0000 mg | ORAL_TABLET | Freq: Every day | ORAL | Status: DC
  Filled 2015-10-31: qty 8

## 2015-10-31 MED ORDER — PREDNISONE 20 MG PO TABS
40.0000 mg | ORAL_TABLET | Freq: Every day | ORAL | Status: DC
  Administered 2015-10-31 – 2015-11-01 (×2): 40 mg via ORAL
  Filled 2015-10-31 (×2): qty 2

## 2015-10-31 NOTE — Progress Notes (Signed)
PT Cancellation Note  Patient Details Name: [name] MRN: [id number] DOB: 31-Jan-1941   Cancelled Treatment:    Reason Eval/Treat Not Completed: Other (comment) (difficulty breathing) Pt reports that he was feeling ready to do some walking earlier, but that his breathing is too poor now and that he would like to wait until tomorrow to get up and walk.    Wayne Both, PT, DPT [phone]   Kreg Shropshire 10/31/2015, 4:50 PM

## 2015-10-31 NOTE — Consult Note (Signed)
Patient doing well, Hgb 9.7 is stable. VSS afebrile, chest clear, heart RRR, abd obese but not tender.  He has pressure of speech and fluctuating mood but at moment stable and appreciative of his care here.  May go home soon.

## 2015-10-31 NOTE — Progress Notes (Signed)
Patient ID: SHIRON SPRINGER, male   DOB: [birth year], 75 y.o.   MRN: [id number] [facility] PROGRESS NOTE  THUY ISAIAH [id number] DOB: 01/31/1941 DOA: 10/25/2015 PCP: [name], MD  HPI/Subjective: Patient is now very appreciative of everything that the physicians have done for him. He still complains of shortness of breath and wheeze and cough. No abdominal pain or blood in the stool that he sees  Objective: Filed Vitals:   10/31/15 0504 10/31/15 0855  BP: 123/70 129/72  Pulse: 92 93  Temp: 97.9 F (36.6 C)   Resp: 17     Filed Weights   10/25/15 2203  Weight: 120.203 kg (265 lb)    ROS: Review of Systems  Constitutional: Negative for fever and chills.  Eyes: Negative for blurred vision.  Respiratory: Positive for cough, shortness of breath and wheezing.   Cardiovascular: Negative for chest pain.  Gastrointestinal: Negative for nausea, vomiting, abdominal pain, diarrhea, constipation and melena.  Genitourinary: Negative for dysuria.  Musculoskeletal: Negative for joint pain.  Neurological: Negative for dizziness and headaches.   Exam: Physical Exam  Constitutional: He is oriented to person, place, and time.  HENT:  Right Ear: Tympanic membrane normal.  Left Ear: Tympanic membrane normal.  Nose: No mucosal edema.  Mouth/Throat: No oropharyngeal exudate or posterior oropharyngeal edema.  Eyes: Conjunctivae, EOM and lids are normal. Pupils are equal, round, and reactive to light.  Neck: Neck supple. No JVD present. Carotid bruit is not present. No tracheal deviation and no edema present. No thyroid mass and no thyromegaly present.  Cardiovascular: Regular rhythm, S1 normal, S2 normal and normal heart sounds.  Exam reveals no gallop.   No murmur heard. Pulses:      Carotid pulses are 2+ on the right side, and 2+ on the left side.      Dorsalis pedis pulses are 2+ on the right side, and 2+ on the left side.  Respiratory: No accessory  muscle usage. No respiratory distress. He has decreased breath sounds in the right middle field, the right lower field, the left middle field and the left lower field. He has wheezes in the right lower field and the left lower field. He has rhonchi in the right lower field. He has no rales.  GI: Soft. Bowel sounds are normal. He exhibits no distension. There is no hepatosplenomegaly. There is no tenderness. There is no CVA tenderness.  Musculoskeletal:       Right ankle: He exhibits swelling.       Left ankle: He exhibits swelling.  Lymphadenopathy:    He has no cervical adenopathy.    He has no axillary adenopathy.  Neurological: He is alert and oriented to person, place, and time. He has normal strength. No cranial nerve deficit or sensory deficit.  Reflex Scores:      Patellar reflexes are 2+ on the right side and 2+ on the left side. Skin: Skin is warm. No rash noted. Nails show no clubbing.  Psychiatric: He has a normal mood and affect.    Data Reviewed: Basic Metabolic Panel:  Recent Labs Lab 10/25/15 2313 10/26/15 0839 10/27/15 1308 10/30/15 0454  NA 139 133*  --   --   K 3.6 2.8* 3.3* 4.0  CL 100* 98*  --   --   CO2 28 28  --   --   GLUCOSE 369* 202*  --   --   BUN 50* 43*  --   --   CREATININE 1.42* 1.29*  --   --  CALCIUM 8.6* 7.9*  --   --    Liver Function Tests:  Recent Labs Lab 10/25/15 2313  AST 16  ALT 14*  ALKPHOS 79  BILITOT 0.5  PROT 6.0*  ALBUMIN 3.3*  CBC:  Recent Labs Lab 10/25/15 2313 10/26/15 0839 10/27/15 0458 10/28/15 0846 10/29/15 0749 10/30/15 0454 10/31/15 0611  WBC 12.8* 10.3 8.3 7.0  --   --   --   NEUTROABS 11.2*  --  6.4 5.2  --   --   --   HGB 6.9* 7.5* 9.1* 7.7* 8.9* 9.6* 9.7*  HCT 21.9* 22.2* 27.5* 24.2*  --   --   --   MCV 91.8 89.5 88.7 89.5  --   --   --   PLT 213 174 185 164  --   --   --    Cardiac Enzymes:  Recent Labs Lab 10/25/15 2313  TROPONINI <0.03   BNP (last 3 results)  Recent Labs   05/06/15 1354  BNP 181.0*    CBG:  Recent Labs Lab 10/30/15 1130 10/30/15 1650 10/30/15 2118 10/31/15 0803 10/31/15 1227  GLUCAP 275* 210* 340* 183* 238*    Scheduled Meds: . atorvastatin  40 mg Oral QHS  . budesonide-formoterol  2 puff Inhalation BID  . citalopram  40 mg Oral Daily  . doxycycline  100 mg Oral BID  . ferrous sulfate  650 mg Oral TID WC  . gabapentin  100 mg Oral TID  . insulin aspart  0-15 Units Subcutaneous TID WC  . insulin aspart  0-5 Units Subcutaneous QHS  . insulin glargine  25 Units Subcutaneous QHS  . metoprolol tartrate  25 mg Oral BID  . pantoprazole (PROTONIX) IV  40 mg Intravenous Q12H  . predniSONE  40 mg Oral Daily  . sodium chloride  3 mL Intravenous Q12H  . sucralfate  1 g Oral TID WC & HS  . tiotropium  18 mcg Inhalation Daily    Assessment/Plan:  1. Acute on chronic blood loss anemia. The patient has had numerous episodes of bleeding in the past and had numerous procedures. I gave another dose of Venofer today. I think the key here is going to be follow-up and checking up counts as outpatient and IV iron as needed. Dr. Gustavo Lah will follow him up on Thursday. The patient would like to check one more hemoglobin tomorrow and then potential discharge tomorrow. 2. Chronic respiratory failure with COPD. continue prednisone taper and continue doxycycline.  3. Type 2 diabetes mellitus- sliding scale and Lantus insulin with increased dose 4. Hyperlipidemia unspecified continue atorvastatin 5. Diabetic neuropathy on gabapentin 6. Hyperlipidemia unspecified continue atorvastatin 7. Diabetic neuropathy on gabapentin 8. Hypokalemia replaced  Code Status:     Code Status Orders        Start     Ordered   10/26/15 0315  Full code   Continuous     10/26/15 0314      Advance Directive Documentation        Most Recent Value   Type of Advance Directive  Healthcare Power of Attorney   Pre-existing out of facility DNR order (yellow form or  pink MOST form)     "MOST" Form in Place?       Disposition Plan: Potentially home tomorrow.  Consultants:  gastroenterolgy  Time spent: 20 minutes  Loletha Grayer  [facility]

## 2015-11-01 LAB — HEMOGLOBIN: HEMOGLOBIN: 9.9 g/dL — AB (ref 13.0–18.0)

## 2015-11-01 LAB — GLUCOSE, CAPILLARY
GLUCOSE-CAPILLARY: 226 mg/dL — AB (ref 65–99)
GLUCOSE-CAPILLARY: 368 mg/dL — AB (ref 65–99)

## 2015-11-01 MED ORDER — INSULIN GLARGINE 100 UNIT/ML ~~LOC~~ SOLN: 25.0000 [IU] | Freq: Two times a day (BID) | SUBCUTANEOUS | Status: DC

## 2015-11-01 MED ORDER — PREDNISONE 5 MG PO TABS: ORAL_TABLET | ORAL | Status: DC

## 2015-11-01 MED ORDER — HYDROCODONE-ACETAMINOPHEN 5-325 MG PO TABS: 1.0000 | ORAL_TABLET | Freq: Four times a day (QID) | ORAL | Status: DC | PRN

## 2015-11-01 MED ORDER — ZOLPIDEM TARTRATE 5 MG PO TABS: 5.0000 mg | ORAL_TABLET | Freq: Every evening | ORAL | Status: DC | PRN

## 2015-11-01 MED ORDER — PREDNISONE 20 MG PO TABS: 30.0000 mg | ORAL_TABLET | Freq: Every day | ORAL | Status: DC

## 2015-11-01 NOTE — Discharge Instructions (Signed)
Anemia, Nonspecific Anemia is a condition in which the concentration of red blood cells or hemoglobin in the blood is below normal. Hemoglobin is a substance in red blood cells that carries oxygen to the tissues of the body. Anemia results in not enough oxygen reaching these tissues.  CAUSES  Common causes of anemia include:   Excessive bleeding. Bleeding may be internal or external. This includes excessive bleeding from periods (in women) or from the intestine.   Poor nutrition.   Chronic kidney, thyroid, and liver disease.  Bone marrow disorders that decrease red blood cell production.  Cancer and treatments for cancer.  HIV, AIDS, and their treatments.  Spleen problems that increase red blood cell destruction.  Blood disorders.  Excess destruction of red blood cells due to infection, medicines, and autoimmune disorders. SIGNS AND SYMPTOMS   Minor weakness.   Dizziness.   Headache.  Palpitations.   Shortness of breath, especially with exercise.   Paleness.  Cold sensitivity.  Indigestion.  Nausea.  Difficulty sleeping.  Difficulty concentrating. Symptoms may occur suddenly or they may develop slowly.  DIAGNOSIS  Additional blood tests are often needed. These help your health care provider determine the best treatment. Your health care provider will check your stool for blood and look for other causes of blood loss.  TREATMENT  Treatment varies depending on the cause of the anemia. Treatment can include:   Supplements of iron, vitamin 123456, or folic acid.   Hormone medicines.   A blood transfusion. This may be needed if blood loss is severe.   Hospitalization. This may be needed if there is significant continual blood loss.   Dietary changes.  Spleen removal. HOME CARE INSTRUCTIONS Keep all follow-up appointments. It often takes many weeks to correct anemia, and having your health care provider check on your condition and your response to  treatment is very important. SEEK IMMEDIATE MEDICAL CARE IF:   You develop extreme weakness, shortness of breath, or chest pain.   You become dizzy or have trouble concentrating.  You develop heavy vaginal bleeding.   You develop a rash.   You have bloody or black, tarry stools.   You faint.   You vomit up blood.   You vomit repeatedly.   You have abdominal pain.  You have a fever or persistent symptoms for more than 2-3 days.   You have a fever and your symptoms suddenly get worse.   You are dehydrated.  MAKE SURE YOU:  Understand these instructions.  Will watch your condition.  Will get help right away if you are not doing well or get worse.   This information is not intended to replace advice given to you by your health care provider. Make sure you discuss any questions you have with your health care provider.   Document Released: 11/10/2004 Document Revised: 06/05/2013 Document Reviewed: 03/29/2013 Elsevier Interactive Patient Education 2016 [place] not take aspirin, anti-inflammatories including advil, motrin, alleve, ibuprofen, bc powder.

## 2015-11-01 NOTE — Discharge Summary (Signed)
[facility] NAME: [name]    MR#:  [id number]  DATE OF BIRTH:  Mar 30, 1941  DATE OF ADMISSION:  10/25/2015 ADMITTING PHYSICIAN: Saundra Shelling, MD  DATE OF DISCHARGE: 11/01/2015  PRIMARY CARE PHYSICIAN: [name], MD    ADMISSION DIAGNOSIS:  Shortness of breath [R06.02] Iron deficiency anemia due to chronic blood loss [D50.0] Upper GI bleed [K92.2] Supplemental oxygen dependent [Z99.81] Pulmonary emphysema, unspecified emphysema type (HCC) [J43.9]  DISCHARGE DIAGNOSIS:  Principal Problem:   Dyspnea Active Problems:   Anemia   GI bleed   SECONDARY DIAGNOSIS:   Past Medical History  Diagnosis Date  . Allergic rhinitis, cause unspecified   . Hyperlipidemia   . CAD (coronary artery disease)     a. 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  . Essential hypertension   . COPD (chronic obstructive pulmonary disease) ([place])     a. GOLD stage IV, started home O2. Severe bullous disease of LUL. Prolonged intubation after surgeries due to COPD.  Marland Kitchen AAA (abdominal aortic aneurysm) ([place])     a. 12/2008 s/p 7cm, endovascular repair with coiling right hypogastric artery   . Recurrent Microcytic Anemia     a. presumed chronic GI blood loss.  . Memory loss   . Type II diabetes mellitus ([place])   . Chronic diastolic CHF (congestive heart failure) ([place])     a. 04/2015 Echo: EF 55-60%, no rwma, Gr 1 DD, mild AI.  Marland Kitchen GI bleed requiring more than 4 units of blood in 24 hours, ICU, or surgery     a. Hx bleeding gastric polyps, cecal & sigmoid AVMS s/p APC 03/30/14  . AVM (arteriovenous malformation) of colon with hemorrhage   . Multiple gastric polyps   . Diverticulosis   . Essential hypertension 08/18/2009    Qualifier: Diagnosis of  By: Doy Mince LPN, Megan    . Anxiety state 09/10/2013  . Depression 01/14/2013  . Vitamin D deficiency 08/10/2014  . Leucocytosis 12/04/2013  . Insomnia  08/10/2014  . Bipolar 1 disorder, mixed, moderate ([place]) 04/16/2015  . Morbid obesity ([place])     HOSPITAL COURSE:   1. Acute on chronic blood loss anemia. The patient has had numerous episodes of bleeding in the past and had numerous procedures in the past. He was felt to be too high risk for endoscopy at this time by Dr. Gustavo Lah and anesthesia. I gave 2 doses of IV Venofer here. Hemoglobin upon discharge is 9.9. In the past and also on this occasion I explained to the patient that he should follow-up at the cancer center with Dr. Grayland Ormond. The goal is to try to keep up with what he is losing. Continue iron supplementation. Avoid aspirin and NSAIDs including Advil Motrin Aleve and ibuprofen and Goody powder. 2 chronic respiratory failure, COPD. Patient was given stress dose steroids prior to what we thought was going to the procedure. And will be given a prednisone taper he'll finish out his doxycycline. 3. Type 2 diabetes mellitus sliding scale insulin and Lantus increased dose to 25 units 4. Hyperlipidemia unspecified on atorvastatin 5. Diabetic neuropathy on gabapentin 6. Hypokalemia replaced   DISCHARGE CONDITIONS:   Fair. High risk for readmissions with chronic GI bleeding  CONSULTS OBTAINED:  Treatment Team:  Lollie Sails, MD  DRUG ALLERGIES:   Allergies  Allergen Reactions  . Penicillins Anaphylaxis, Hives and Other (See Comments)    Pt is unable to answer additional  questions about this medication.    . Demerol [Meperidine] Other (See Comments)    Reaction:  Hallucinations    . Dilaudid [Hydromorphone Hcl] Other (See Comments)    Reaction:  Hallucinations   . Levofloxacin Other (See Comments)    unknown  . Morphine And Related Nausea Only and Other (See Comments)    Hallucinations, rash, shortness of breath    DISCHARGE MEDICATIONS:   Current Discharge Medication List    CONTINUE these medications which have CHANGED   Details  HYDROcodone-acetaminophen  (NORCO/VICODIN) 5-325 MG tablet Take 1 tablet by mouth every 6 (six) hours as needed for moderate pain. Qty: 30 tablet, Refills: 0    insulin glargine (LANTUS) 100 UNIT/ML injection Inject 0.25 mLs (25 Units total) into the skin 2 (two) times daily. Qty: 10 mL, Refills: 0    predniSONE (DELTASONE) 5 MG tablet 4 tabs orally day one; 3 tabs orally day2;  2 tabs orally day3,4; one tab day5,6 then stop Qty: 13 tablet, Refills: 0    zolpidem (AMBIEN) 5 MG tablet Take 1 tablet (5 mg total) by mouth at bedtime as needed for sleep. Qty: 30 tablet, Refills: 0      CONTINUE these medications which have NOT CHANGED   Details  acetaminophen (TYLENOL) 325 MG tablet Take 650 mg by mouth every 6 (six) hours as needed for mild pain or fever.    albuterol (PROVENTIL HFA;VENTOLIN HFA) 108 (90 BASE) MCG/ACT inhaler Inhale 2 puffs into the lungs every 6 (six) hours as needed for wheezing or shortness of breath. Qty: 1 Inhaler, Refills: 2    ALPRAZolam (XANAX) 0.5 MG tablet Take 1 tablet (0.5 mg total) by mouth 3 (three) times daily as needed for anxiety (for shortness of breath). Qty: 60 tablet, Refills: 0    atorvastatin (LIPITOR) 40 MG tablet Take 40 mg by mouth at bedtime.    budesonide-formoterol (SYMBICORT) 160-4.5 MCG/ACT inhaler Inhale 2 puffs into the lungs 2 (two) times daily. Qty: 1 Inhaler, Refills: 2    cholecalciferol (VITAMIN D) 1000 UNITS tablet Take 1,000 Units by mouth daily.    citalopram (CELEXA) 40 MG tablet Take 40 mg by mouth daily.    doxycycline (MONODOX) 100 MG capsule Take 1 capsule (100 mg total) by mouth 2 (two) times daily. Qty: 14 capsule, Refills: 0    ferrous sulfate 325 (65 FE) MG tablet Take 650 mg by mouth 3 (three) times daily with meals.    gabapentin (NEURONTIN) 100 MG capsule Take 1 capsule (100 mg total) by mouth 3 (three) times daily. Qty: 90 capsule, Refills: 3   Associated Diagnoses: Diabetic polyneuropathy associated with type 2 diabetes mellitus (HCC)     insulin aspart (NOVOLOG) 100 UNIT/ML injection Inject 10 Units into the skin 3 (three) times daily with meals. Qty: 10 mL, Refills: 11    lidocaine (XYLOCAINE) 5 % ointment Apply 1 application topically as needed. Qty: 35.44 g, Refills: 0   Associated Diagnoses: Diabetic polyneuropathy associated with type 2 diabetes mellitus (HCC)    metoprolol tartrate (LOPRESSOR) 25 MG tablet Take 25 mg by mouth 2 (two) times daily.    pantoprazole (PROTONIX) 40 MG tablet Take 1 tablet (40 mg total) by mouth 2 (two) times daily. Qty: 60 tablet, Refills: 0    SPIRIVA HANDIHALER 18 MCG inhalation capsule PLACE 1 CAPSULE (18 MCG TOTAL) INTO INHALER AND INHALE DAILY. Qty: 30 capsule, Refills: 5    sucralfate (CARAFATE) 1 G tablet Take 1 g by mouth 4 (four) times daily -  with meals and at bedtime.         DISCHARGE INSTRUCTIONS:   Follow-up Dr. Gustavo Lah on Thursday Follow-up with Dr. Grayland Ormond at the cancer center 1-2 weeks  If you experience worsening of your admission symptoms, develop shortness of breath, life threatening emergency, suicidal or homicidal thoughts you must seek medical attention immediately by calling 911 or calling your MD immediately  if symptoms less severe.  You Must read complete instructions/literature along with all the possible adverse reactions/side effects for all the Medicines you take and that have been prescribed to you. Take any new Medicines after you have completely understood and accept all the possible adverse reactions/side effects.   Please note  You were cared for by a hospitalist during your hospital stay. If you have any questions about your discharge medications or the care you received while you were in the hospital after you are discharged, you can call the unit and asked to speak with the hospitalist on call if the hospitalist that took care of you is not available. Once you are discharged, your primary care physician will handle any further medical  issues. Please note that NO REFILLS for any discharge medications will be authorized once you are discharged, as it is imperative that you return to your primary care physician (or establish a relationship with a primary care physician if you do not have one) for your aftercare needs so that they can reassess your need for medications and monitor your lab values.    Today   CHIEF COMPLAINT:   Chief Complaint  Patient presents with  . Shortness of Breath    HISTORY OF PRESENT ILLNESS:  [name]  is a 75 y.o. male with a known history of chronic bleeding. He came in with shortness of breath and found to be anemic.   VITAL SIGNS:  Blood pressure 113/70, pulse 93, temperature 97.9 F (36.6 C), temperature source Oral, resp. rate 18, height 6\' 3"  (1.905 m), weight 120.203 kg (265 lb), SpO2 98 %.    PHYSICAL EXAMINATION:  GENERAL:  75 y.o.-year-old patient lying in the bed with no acute distress.  EYES: Pupils equal, round, reactive to light and accommodation. No scleral icterus. Extraocular muscles intact.  HEENT: Head atraumatic, normocephalic. Oropharynx and nasopharynx clear.  NECK:  Supple, no jugular venous distention. No thyroid enlargement, no tenderness.  LUNGS: Decreased breath sounds bilaterally, no wheezing, rales,rhonchi or crepitation. No use of accessory muscles of respiration.  CARDIOVASCULAR: S1, S2 normal. No murmurs, rubs, or gallops.  ABDOMEN: Soft, non-tender, non-distended. Bowel sounds present. No organomegaly or mass.  EXTREMITIES: No pedal edema, cyanosis, or clubbing.  NEUROLOGIC: Cranial nerves II through XII are intact. Muscle strength 5/5 in all extremities. Sensation intact. Gait not checked.  PSYCHIATRIC: The patient is alert and oriented x 3.  SKIN: No obvious rash, lesion, or ulcer.   DATA REVIEW:   CBC  Recent Labs Lab 10/28/15 0846  11/01/15 0511  WBC 7.0  --   --   HGB 7.7*  < > 9.9*  HCT 24.2*  --   --   PLT 164  --   --   < > =  values in this interval not displayed.  Chemistries   Recent Labs Lab 10/25/15 2313 10/26/15 0839  10/30/15 0454  NA 139 133*  --   --   K 3.6 2.8*  < > 4.0  CL 100* 98*  --   --   CO2 28 28  --   --  GLUCOSE 369* 202*  --   --   BUN 50* 43*  --   --   CREATININE 1.42* 1.29*  --   --   CALCIUM 8.6* 7.9*  --   --   AST 16  --   --   --   ALT 14*  --   --   --   ALKPHOS 79  --   --   --   BILITOT 0.5  --   --   --   < > = values in this interval not displayed.  Cardiac Enzymes  Recent Labs Lab 10/25/15 2313  TROPONINI <0.03    Microbiology Results  Results for orders placed or performed during the hospital encounter of 05/06/15  MRSA PCR Screening     Status: None   Collection Time: 05/06/15  7:29 PM  Result Value Ref Range Status   MRSA by PCR NEGATIVE NEGATIVE Final    Comment:        The GeneXpert MRSA Assay (FDA approved for NASAL specimens only), is one component of a comprehensive MRSA colonization surveillance program. It is not intended to diagnose MRSA infection nor to guide or monitor treatment for MRSA infections.     Management plans discussed with the patient, family and they are in agreement.  CODE STATUS:     Code Status Orders        Start     Ordered   10/26/15 0315  Full code   Continuous     10/26/15 0314    Code Status History    Date Active Date Inactive Code Status Order ID Comments User Context   08/20/2015  4:41 AM 08/22/2015  4:49 PM Full Code [id number]  Lytle Butte, MD ED   08/20/2015  2:54 AM 08/20/2015  4:41 AM Full Code [id number]  Lytle Butte, MD ED   07/27/2015  8:40 PM 07/29/2015  6:29 PM Full Code [id number]  Lytle Butte, MD ED   06/30/2015 12:40 PM 07/03/2015  3:26 PM Full Code [id number]  Bettey Costa, MD Inpatient   05/19/2015  2:59 PM 05/23/2015  4:21 PM Full Code [id number]  Max Sane, MD Inpatient   05/06/2015  5:14 PM 05/13/2015  5:06 PM Full Code [id number]  Epifanio Lesches, MD ED   05/03/2015 12:29 PM 05/04/2015   7:20 PM Full Code [id number]  Idelle Crouch, MD Inpatient   04/22/2015  4:50 AM 04/27/2015  7:35 PM Full Code [id number]  Harrie Foreman, MD Inpatient   04/17/2015  7:16 PM 04/19/2015  5:16 PM Full Code [id number]  Clovis Fredrickson, MD Inpatient   04/10/2015  9:12 AM 04/17/2015  7:08 PM Full Code [id number]  Demetrios Loll, MD Inpatient   03/09/2015 12:18 PM 03/12/2015  6:45 PM Full Code [id number]  Henreitta Leber, MD Inpatient   08/24/2014  9:41 PM 08/25/2014  5:20 PM Full Code [id number]  Ivor Costa, MD ED   04/02/2014  8:11 PM 04/04/2014  5:10 PM Full Code [id number]  Velvet Bathe, MD Inpatient   03/18/2014  3:44 PM 03/22/2014  2:06 PM Full Code [id number]  Verlee Monte, MD Inpatient   12/04/2013  6:13 AM 12/10/2013  7:54 PM Full Code [id number]  Rise Patience, MD Inpatient   10/15/2013  8:11 PM 10/18/2013  6:40 PM Full Code [id number]  Fuller Plan, MD Inpatient   09/08/2013 12:43 AM 09/10/2013  3:30 PM Full Code [id number]  Toy Baker, MD Inpatient   08/19/2013  5:48 PM  08/22/2013  3:51 PM Full Code [id number]  Orson Eva, MD ED   05/15/2013  1:26 AM 05/17/2013  4:59 PM Full Code [id number]  Theodis Blaze, MD Inpatient   12/04/2012  5:23 PM 12/08/2012  4:56 PM Full Code [id number]  Janece Canterbury, MD Inpatient   05/17/2012 12:56 PM 05/21/2012  6:09 PM Full Code [id number]  Campbell Lerner, RN ED   04/06/2012  7:35 PM 04/14/2012  5:23 PM Full Code [id number]  Marylou Mccoy, RN Inpatient   03/27/2012  4:44 AM 03/28/2012  5:56 PM Full Code [id number]  Theotis Barrio, RN Inpatient    Advance Directive Documentation        Most Recent Value   Type of Advance Directive  Healthcare Power of Attorney   Pre-existing out of facility DNR order (yellow form or pink MOST form)     "MOST" Form in Place?        TOTAL TIME TAKING CARE OF THIS PATIENT: 35 minutes.    Loletha Grayer M.D on 11/01/2015 at 3:08 PM  Between 7am to 6pm - Pager - [phone]  After 6pm go to [email or web address] - password [email or web address]  [facility]  Office  [phone]  CC: Primary care physician; [name], MD

## 2015-11-01 NOTE — Progress Notes (Signed)
Pt's ride present for discharge; pt connected to portable O2 at [address] [place]; pt discharged via wheelchair to the visitor's entrance

## 2015-11-01 NOTE — Progress Notes (Signed)
MD order received in [facility] to discharge pt home today; verbally reviewed AVS with pt including medications/gave Rxs for Ambien, Prednisone, Lantus and Norco; follow up appointment/pt to call Dr Gustavo Lah on 11/02/15 to set up appointment for Thursday, 11/05/15; no further questions voiced at this time; pt's discharge pending arrival of his spouse with his portable O2 and a ride home

## 2015-11-01 NOTE — Care Management Note (Signed)
Case Management Note  Patient Details  Name: [name] MRN: [id number] Date of Birth: 1940/11/17  Subjective/Objective:    Received a call from [facility] that [name] is an open client of theirs. Requested resumption of care orders for RN and new order for PT. Discussed with Dr Leslye Peer who ordered home health. A referral and continuation of care orders were faxed to [facility].                 Action/Plan:   Expected Discharge Date:                  Expected Discharge Plan:     In-House Referral:     Discharge planning Services     Post Acute Care Choice:    Choice offered to:     DME Arranged:    DME Agency:     HH Arranged:    [place] Agency:     Status of Service:     Medicare Important Message Given:  Yes Date Medicare IM Given:    Medicare IM give by:    Date Additional Medicare IM Given:    Additional Medicare Important Message give by:     If discussed at [place] of Stay Meetings, dates discussed:    Additional Comments:  Hazell Siwik A, RN 11/01/2015, 3:53 PM

## 2015-11-01 NOTE — Care Management Note (Signed)
Case Management Note  Patient Details  Name: [name] MRN: [id number] Date of Birth: 04/09/1941  Subjective/Objective:     Spoke briefly with [name] who gave one word responses to my questions. He is resigned to going home today. He is on chronic oxygen and wife will need to bring his portable oxygen tank to the hospital for [name] to go home with later today.              Action/Plan:   Expected Discharge Date:                  Expected Discharge Plan:     In-House Referral:     Discharge planning Services     Post Acute Care Choice:    Choice offered to:     DME Arranged:    DME Agency:     HH Arranged:    [place] Agency:     Status of Service:     Medicare Important Message Given:  Yes Date Medicare IM Given:    Medicare IM give by:    Date Additional Medicare IM Given:    Additional Medicare Important Message give by:     If discussed at [place] of Stay Meetings, dates discussed:    Additional Comments:  Helmi Hechavarria A, RN 11/01/2015, 11:56 AM

## 2015-11-01 NOTE — Progress Notes (Signed)
PT Cancellation Note  Patient Details Name: [name] MRN: [id number] DOB: January 26, 1941   Cancelled Treatment:    Reason Eval/Treat Not Completed: Patient declined, no reason specified (Pt reports that he does not feel like taking any exercise)  Pt sitting at EOB on O2, reports he does not feeling like doing any PT right now.  He was clear that he did not want to go to rehab on d/c, but was inconclusive as to whether he would like to have HHPT.  It appeared he would be interested if he could have someone who did not ask him to do the kinds of exercises that he doesn't like.  Difficult to ask questions and get straight answers out of this patient.    Kreg Shropshire 11/01/2015, 11:59 AM

## 2015-11-03 ENCOUNTER — Ambulatory Visit: Payer: Commercial Managed Care - HMO | Admitting: Gastroenterology

## 2015-11-03 LAB — GLUCOSE, CAPILLARY: GLUCOSE-CAPILLARY: 238 mg/dL — AB (ref 65–99)

## 2015-11-04 ENCOUNTER — Encounter: Payer: Self-pay | Admitting: Gastroenterology

## 2015-11-12 IMAGING — CR DG CHEST 1V PORT
1 series · 1 of 1 positions shown · non-contrast
Comparison: Chest radiograph performed 12/04/2013

CLINICAL DATA: Acute onset of cough and difficulty breathing.
Initial encounter.

EXAM:
PORTABLE CHEST - 1 VIEW

[portable]
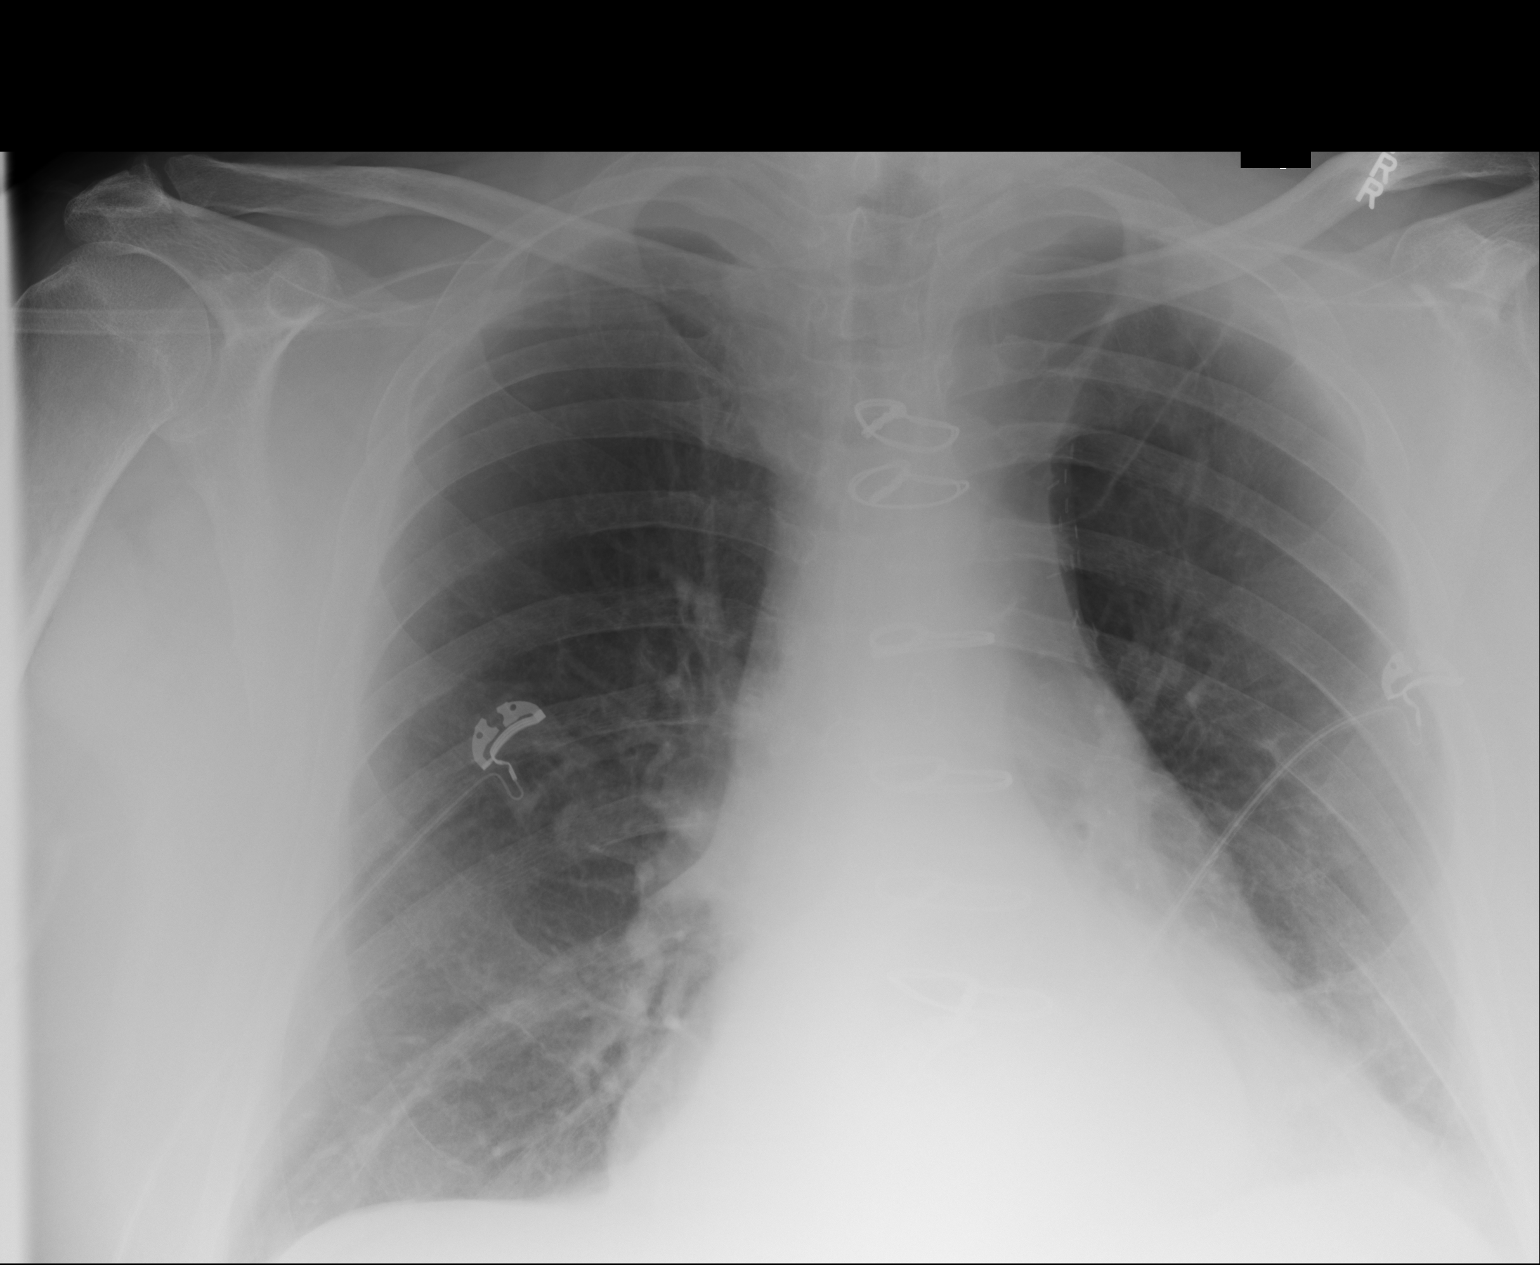

[1 of 1 positions shown; findings below may reference images not displayed]

FINDINGS: The lungs are well-aerated. Mild left basilar opacity likely
reflects atelectasis. There is no evidence of pleural effusion or
pneumothorax.

The cardiomediastinal silhouette is mildly enlarged. The patient is
status post median sternotomy. No acute osseous abnormalities are
seen.
IMPRESSION: Mild left basilar opacity likely reflects atelectasis. Mild
cardiomegaly.

## 2015-11-18 ENCOUNTER — Telehealth: Payer: Self-pay | Admitting: Pulmonary Disease

## 2015-11-18 MED ORDER — UMECLIDINIUM BROMIDE 62.5 MCG/INH IN AEPB: 1.0000 | INHALATION_SPRAY | Freq: Every day | RESPIRATORY_TRACT | Status: DC

## 2015-11-18 NOTE — Telephone Encounter (Signed)
Patient advised of Dr. Anastasia Pall recommendations. Patient given sample of Incruse and advised to call back if he likes the Incruse so we can send in Rx for it. Nothing further needed.

## 2015-11-18 NOTE — Telephone Encounter (Signed)
Spoke with pt and advised that we dont have samples of Spiriva handihaler at this time.  Pt states that he doesn't know if Spiriva is helping that much and doesn't know if it is worth the cost.   Please advise.

## 2015-11-18 NOTE — Telephone Encounter (Signed)
Do we have incruise samples? If yes then try 1 puff daily

## 2015-11-24 IMAGING — CR DG CHEST 1V PORT
1 series · 2 of 2 positions shown · non-contrast
Comparison: Chest radiograph performed 04/10/2015

CLINICAL DATA: Acute onset of bilateral chest pain and generalized
abdominal pain. Shortness of breath. Initial encounter.

EXAM:
PORTABLE CHEST - 1 VIEW

[Series 1: ap · 0.17mm/px · 2 of 2 slices shown]
[im 1/2]
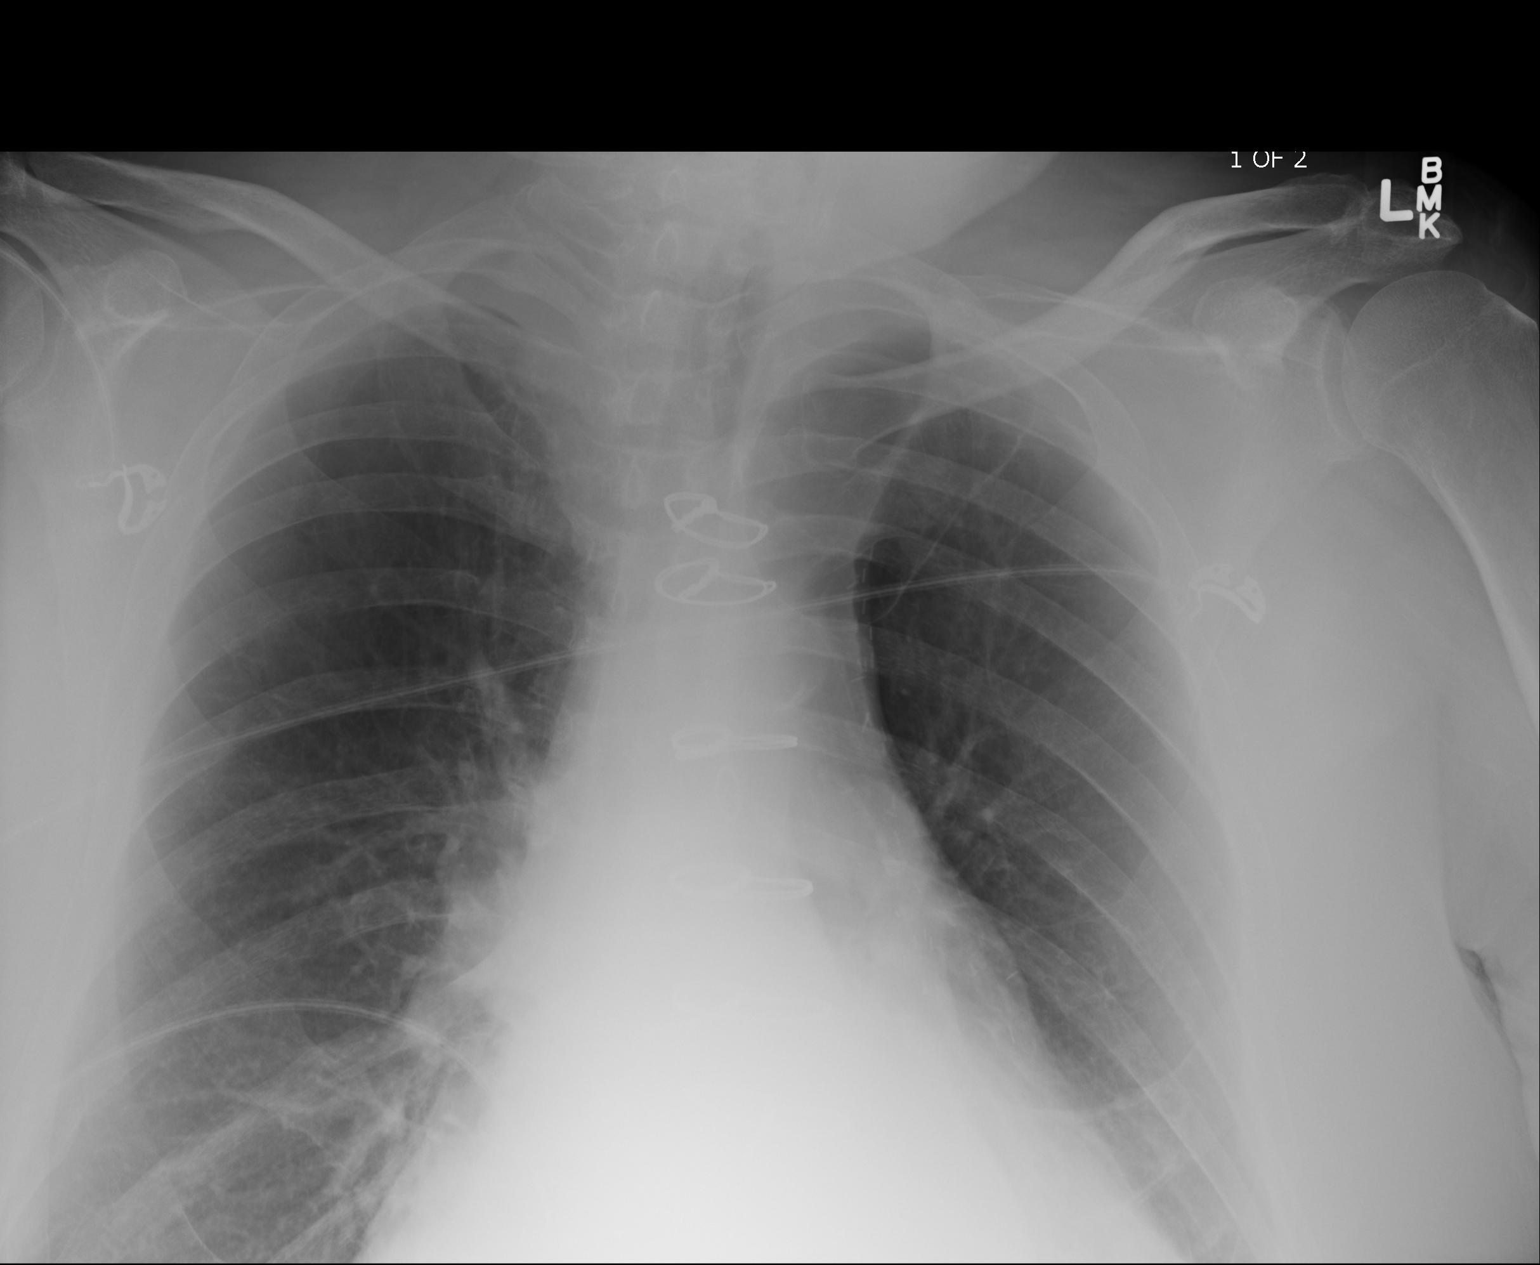
[im 2/2]
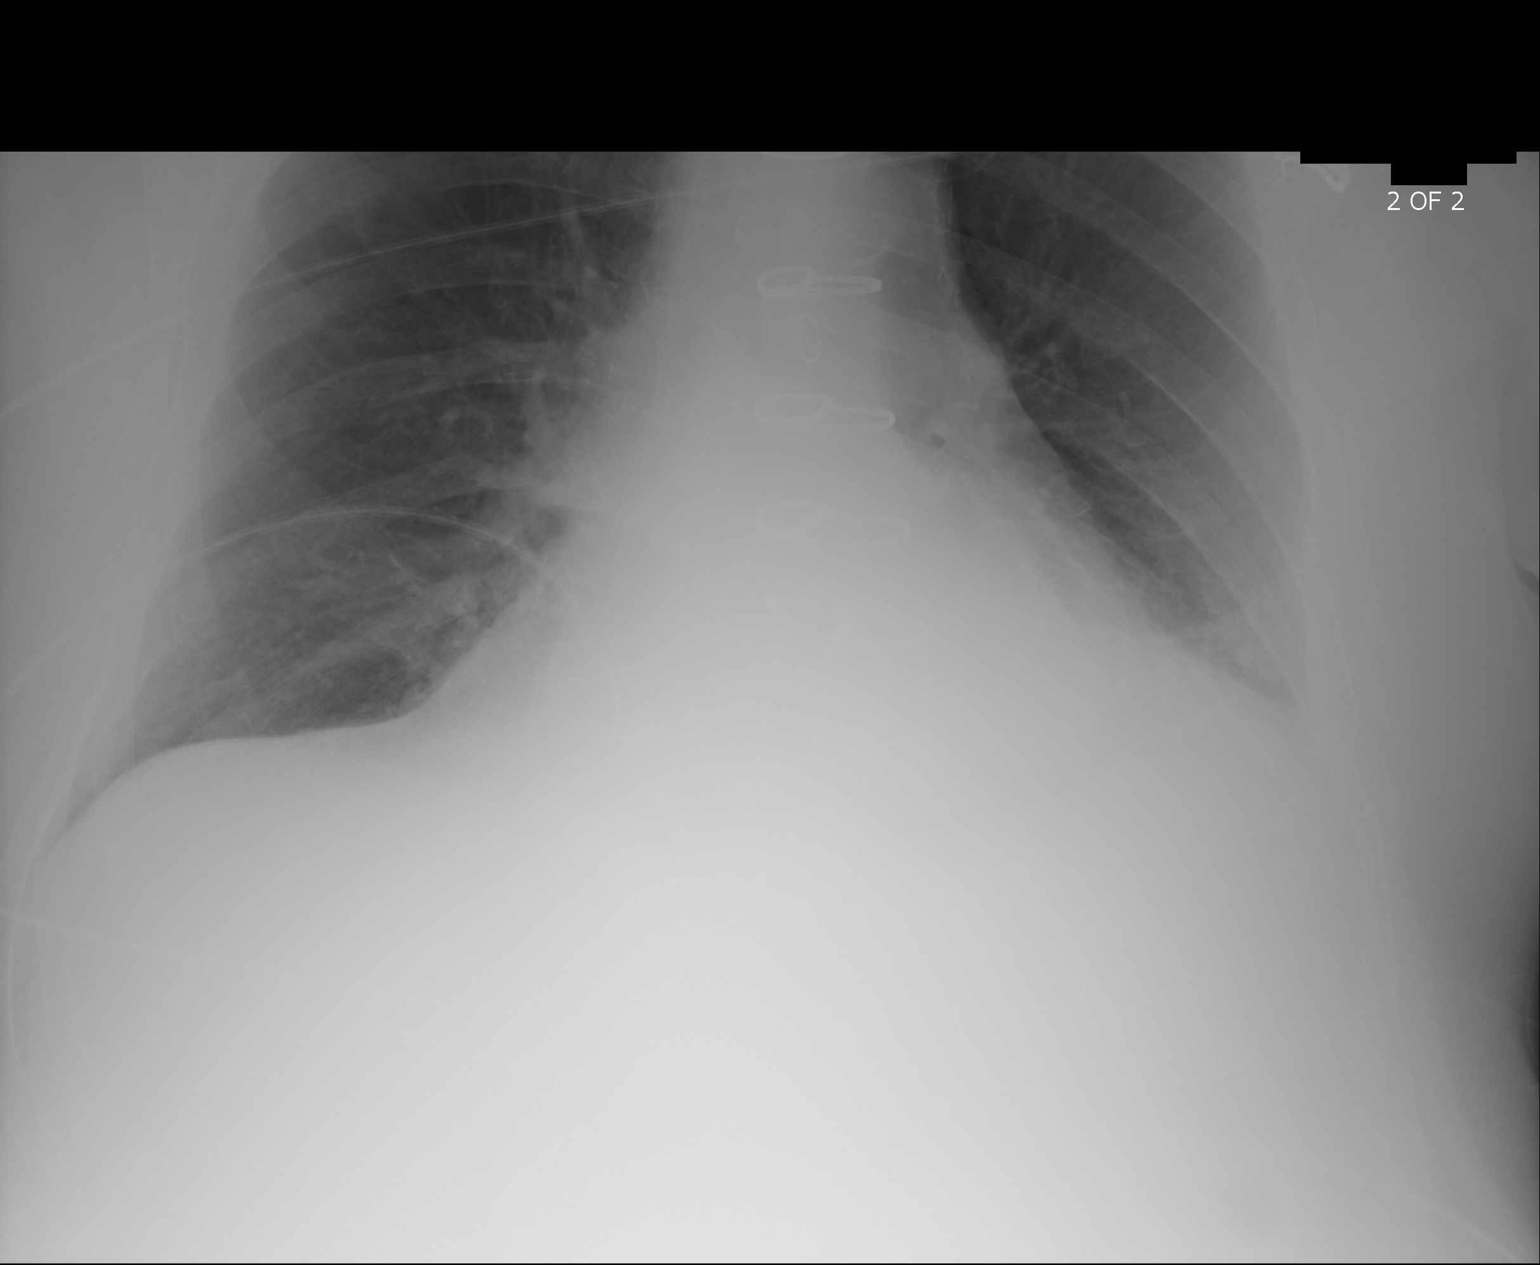

[2 of 2 positions shown; findings below may reference images not displayed]

FINDINGS: The lungs are well-aerated. A small left pleural effusion is noted.
Pulmonary vascularity is at the upper limits of normal. Left basilar
opacity likely reflects atelectasis. There is no evidence of
pneumothorax.

The cardiomediastinal silhouette is mildly enlarged. The patient is
status post median sternotomy. No acute osseous abnormalities are
seen.
IMPRESSION: Small left pleural effusion noted. Mild cardiomegaly. Persistent
mild left basilar airspace opacity likely reflects atelectasis.

## 2015-11-24 IMAGING — CT CT ABD-PELV W/ CM
2 of 6 series · 17 of 46 positions shown, 19 images · IV contrast (omnipaque)
Comparison: 12/29/2014

CLINICAL DATA: Generalized abdominal pain and melena.

EXAM:
CT ABDOMEN AND PELVIS WITH CONTRAST
TECHNIQUE: Multidetector CT imaging of the abdomen and pelvis was performed
using the standard protocol following bolus administration of
intravenous contrast.
CONTRAST:  125mL OMNIPAQUE IOHEXOL 300 MG/ML  SOLN

[Series 2: routine abd pel with · axial · 0.98mm/px · z∈[-595,-115]mm · 14 of 109 slices shown, 16 images]
[im 7/109  soft-tissue]
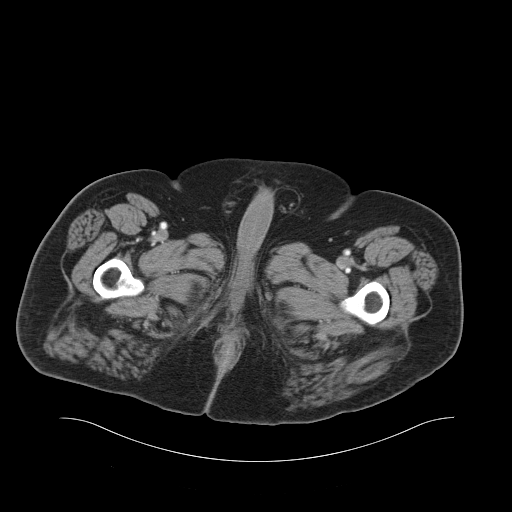
[im 7/109  bone]
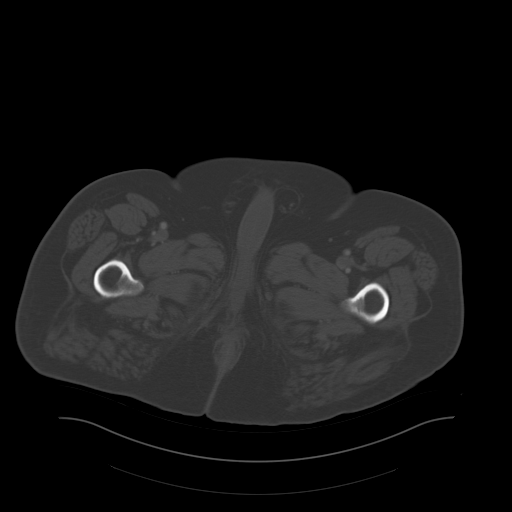
[im 13/109  soft-tissue]
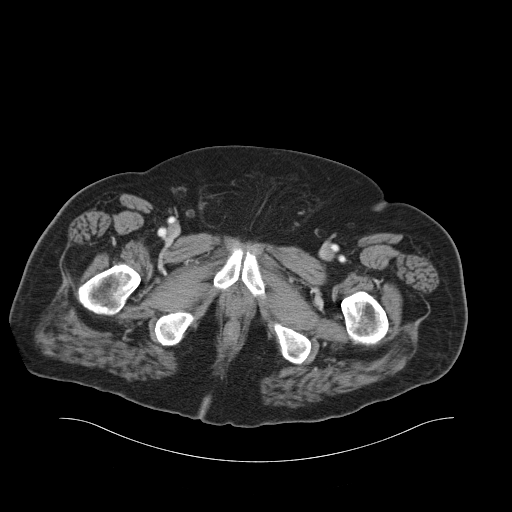
[im 25/109  soft-tissue]
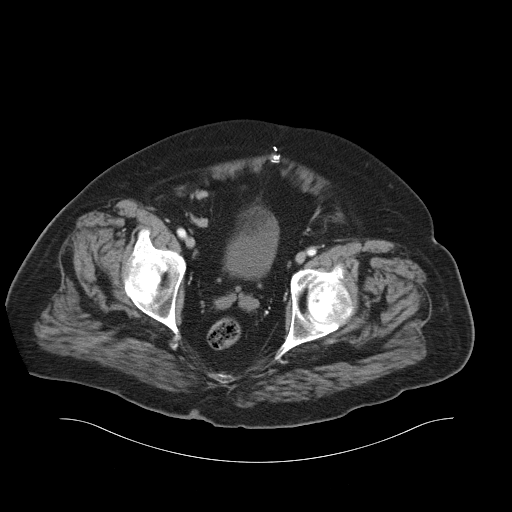
[im 31/109  soft-tissue]
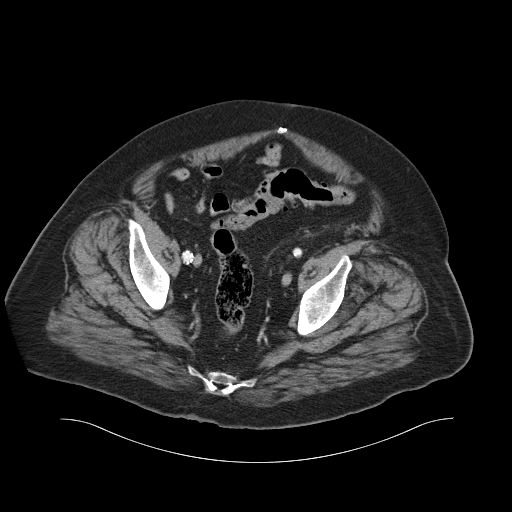
[im 37/109  soft-tissue]
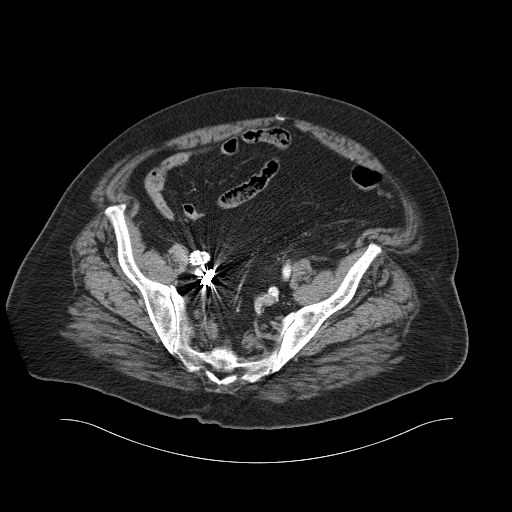
[im 43/109  soft-tissue]
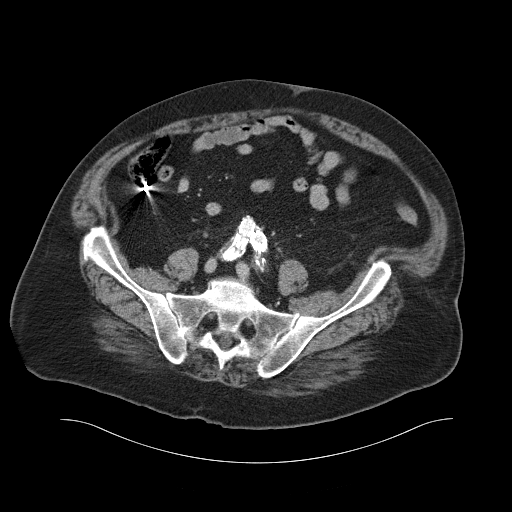
[im 49/109  soft-tissue]
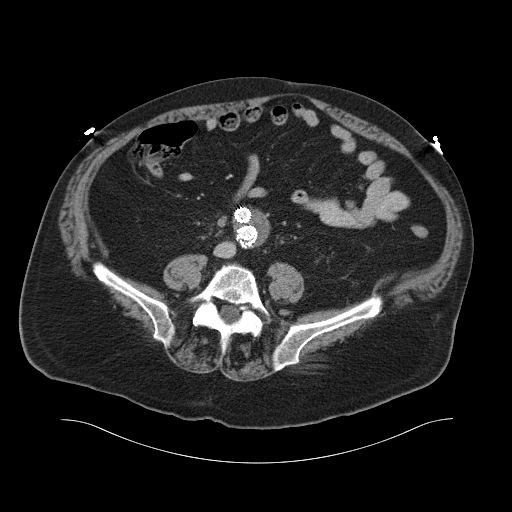
[im 61/109  soft-tissue]
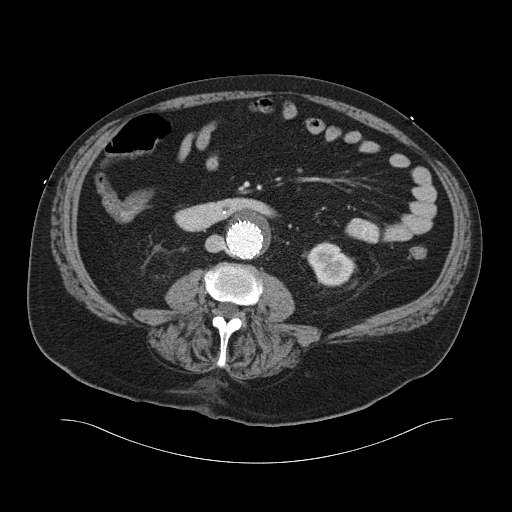
[im 67/109  soft-tissue]
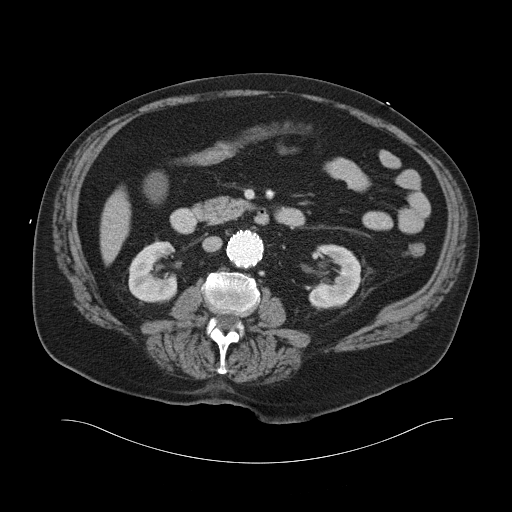
[im 67/109  bone]
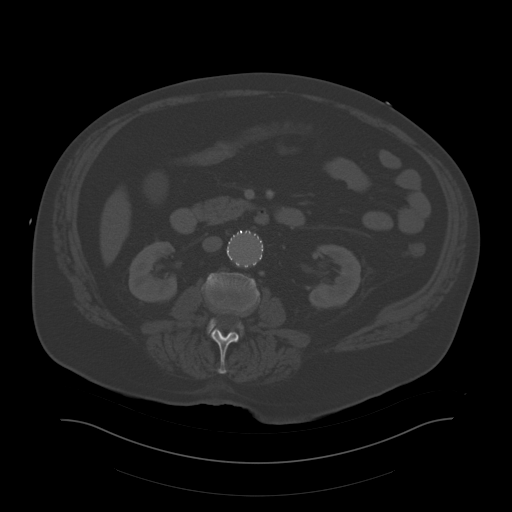
[im 73/109  soft-tissue]
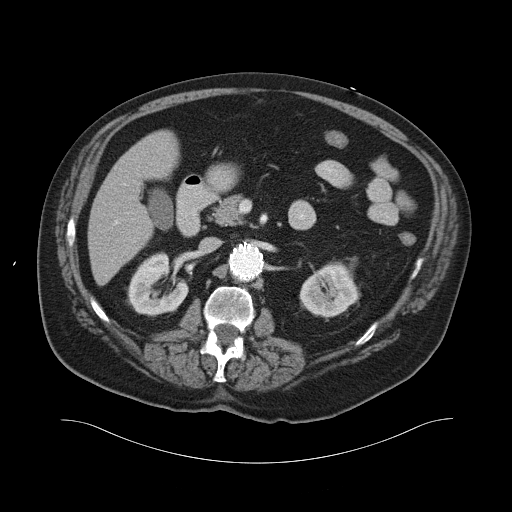
[im 79/109  soft-tissue]
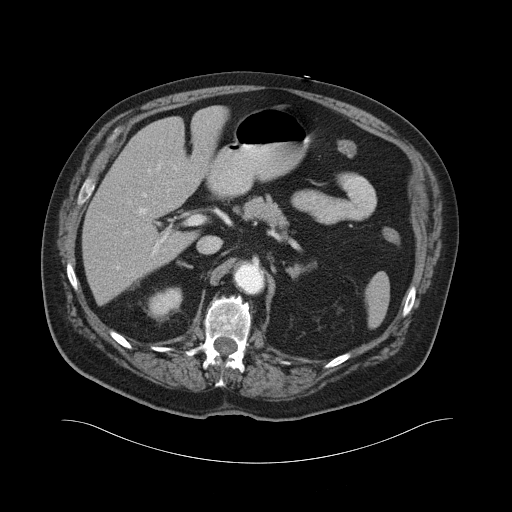
[im 85/109  soft-tissue]
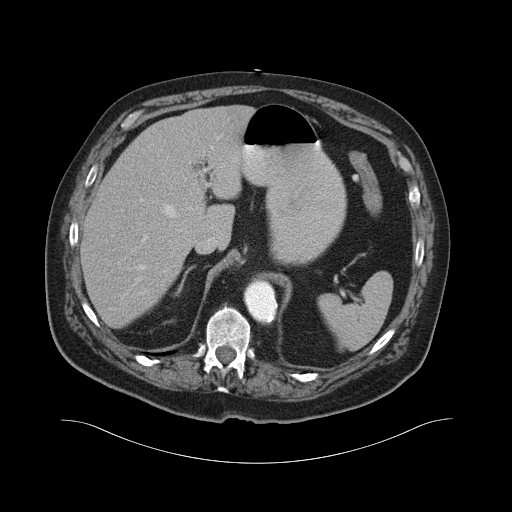
[im 97/109  soft-tissue]
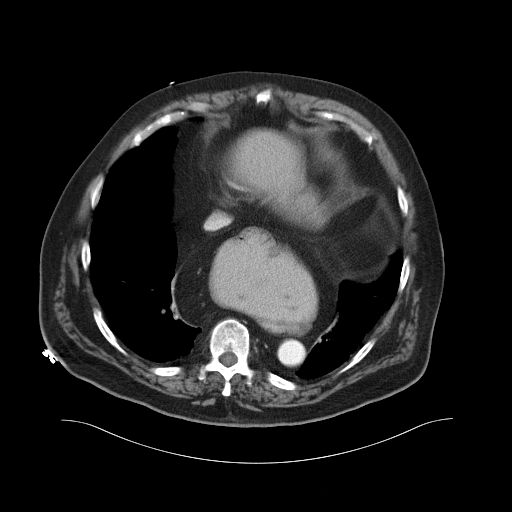
[im 103/109  soft-tissue]
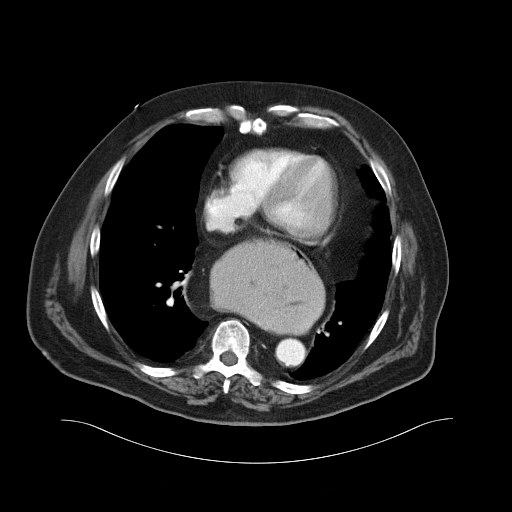

[Series 11: cor routine abd pel with · coronal · 0.82mm/px · 3 of 150 slices shown]
[im 50/150  soft-tissue]
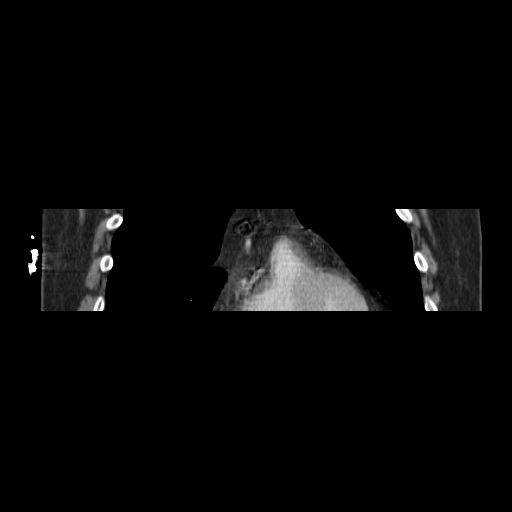
[im 67/150  soft-tissue]
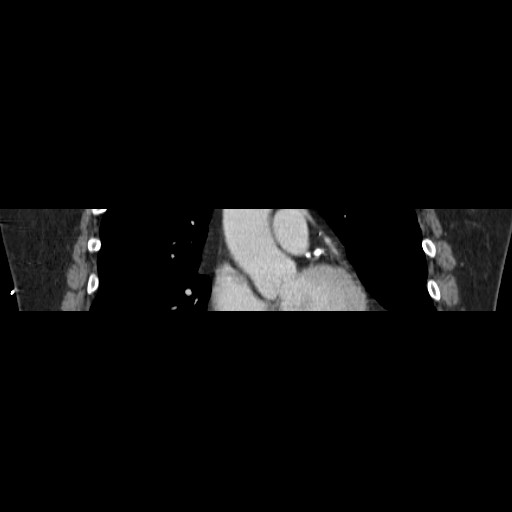
[im 83/150  soft-tissue]
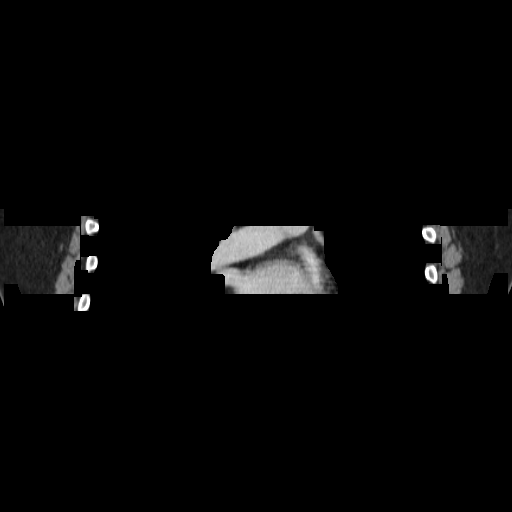

[17 of 46 positions shown; findings below may reference images not displayed]

FINDINGS: BODY WALL: Fatty bilateral inguinal hernias with early herniation of
small bowel on the right.

LOWER CHEST: Large sliding hiatal hernia without obstruction or
inflammatory change.

Mild atelectasis at the left base.

Coronary atherosclerosis.

ABDOMEN/PELVIS:

Liver: No focal abnormality.

Biliary: No evidence of biliary obstruction or stone.

Pancreas: Unremarkable.

Spleen: Unremarkable.

Adrenals: Unremarkable.

Kidneys and ureters: No hydronephrosis or stone. Stable scarring to
the lower pole right kidney, likely from covering of a lower pole
accessory renal artery.

Bladder: Unremarkable.

Reproductive: No pathologic findings.

Bowel: Appendectomy. There is colonic diverticulosis, present mainly
distally. Large sliding hiatal hernia. No inflammatory bowel wall
thickening. A small distal duodenum diverticulum is noted,
uncomplicated. No evidence of peptic ulcer disease, aortoenteric
fistula or other explanation for melena.

Retroperitoneum: No mass or adenopathy.

Peritoneum: No ascites or pneumoperitoneum.

Vascular: Aorto bi-iliac stenting for treatment of an abdominal
aortic aneurysm. The aneurysm graft is thrombosed. No evidence of
major vessel stenosis or occlusion. Small volume gas within the
right common femoral vein is presumably from vascular access. There
is been right hypogastric artery embolization.

OSSEOUS: No acute abnormalities.
IMPRESSION: 1. No acute finding or explanation for melena.
2. Incidental findings are stable from prior study and noted above.

## 2015-11-25 ENCOUNTER — Ambulatory Visit: Payer: Self-pay | Admitting: Pain Medicine

## 2015-11-25 IMAGING — CT CT CHEST HIGH RESOLUTION W/O CM
2 of 5 series · 15 of 33 positions shown, 18 images · non-contrast
Comparison: Chest CT 05/15/2013.

CLINICAL DATA: 74-year-old male with history of cough producing
blackish phlegm. History of GI bleeding.

EXAM:
CT CHEST WITHOUT CONTRAST
TECHNIQUE: Multidetector CT imaging of the chest was performed following the
standard protocol without intravenous contrast. High resolution
imaging of the lungs, as well as inspiratory and expiratory imaging,
was performed.

[Series 2: routine chest wo · axial · 0.82mm/px · z∈[-138,+22]mm · 6 of 65 slices shown]
[im 9/65  lung]
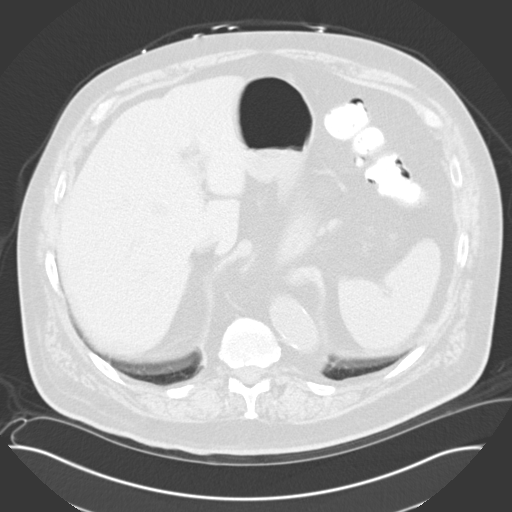
[im 17/65  lung]
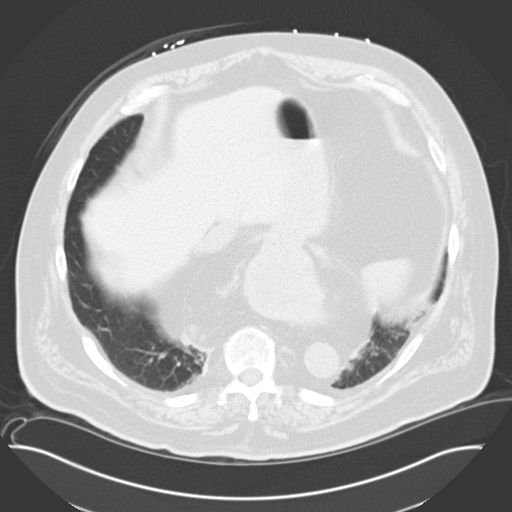
[im 25/65  lung]
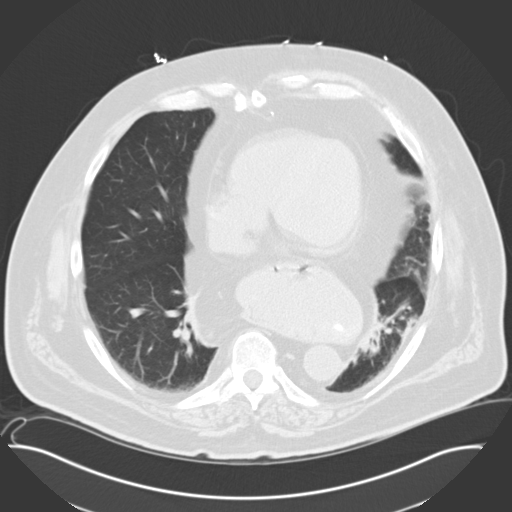
[im 32/65  lung]
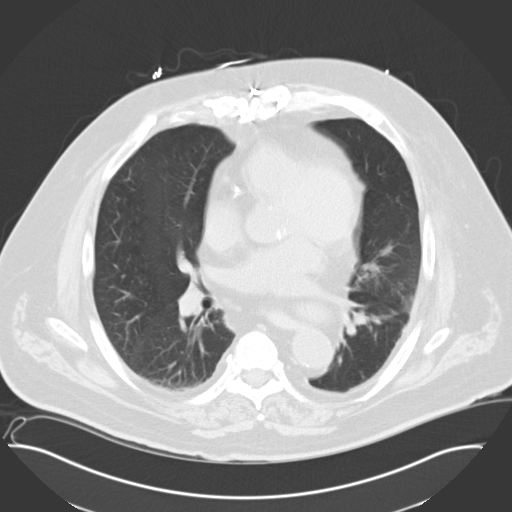
[im 33/65  lung]
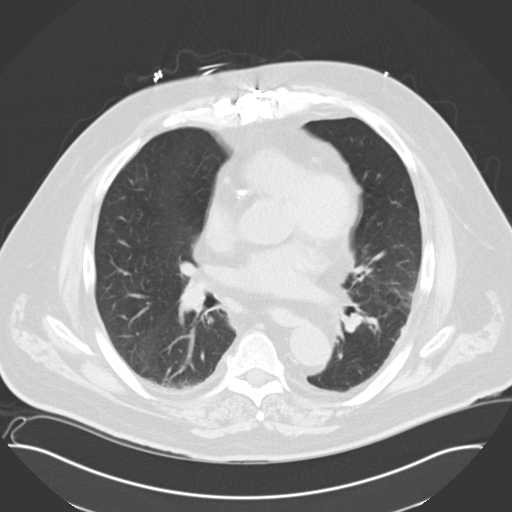
[im 41/65  lung]
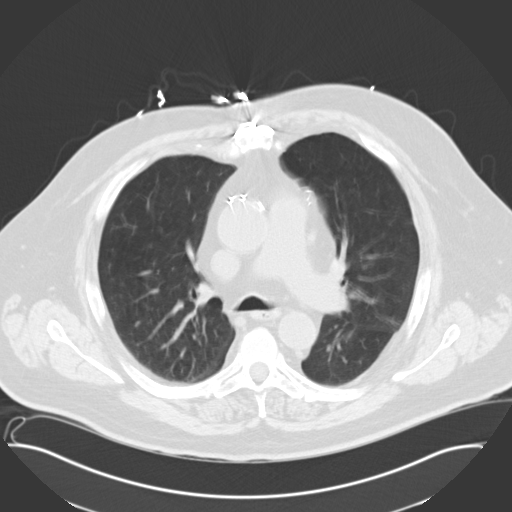

[Series 3: lung · axial · 0.82mm/px · z∈[-138,+108]mm · 9 of 64 slices shown, 12 images]
[im 8/64  mediastinal]
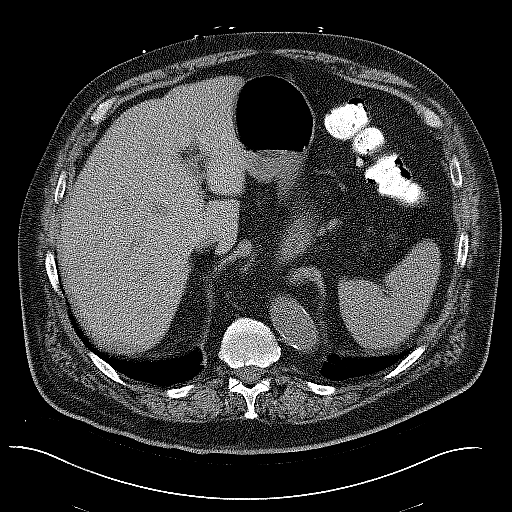
[im 8/64  lung]
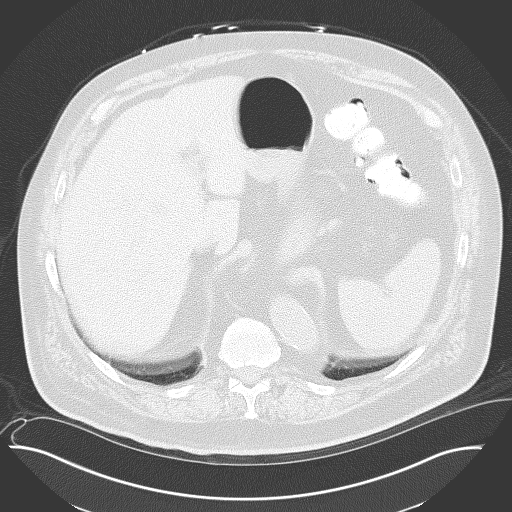
[im 15/64  lung]
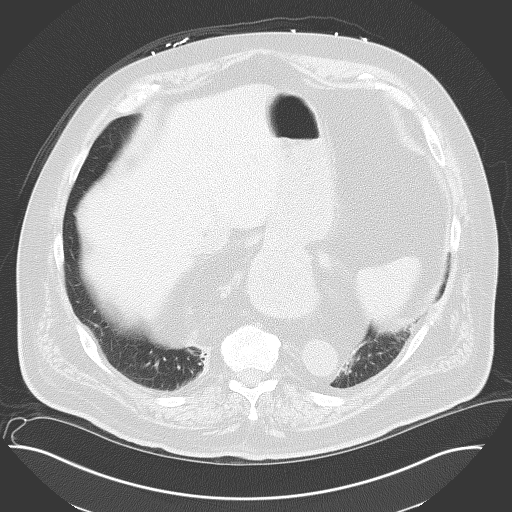
[im 22/64  lung]
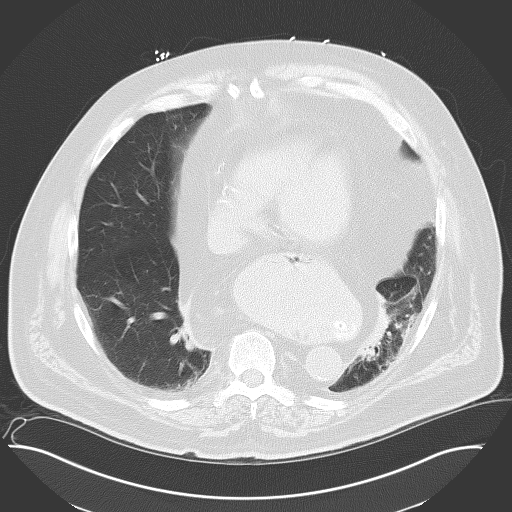
[im 29/64  lung]
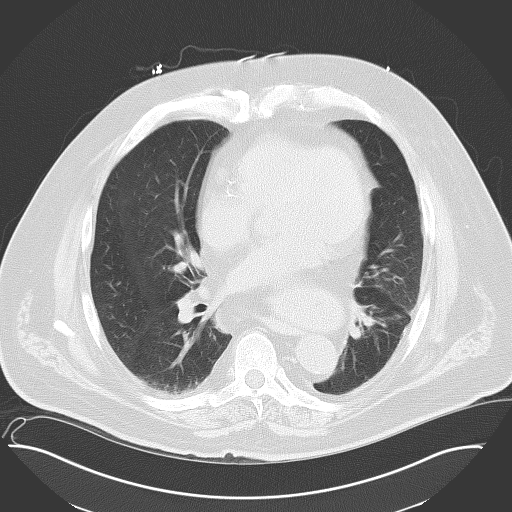
[im 31/64  mediastinal]
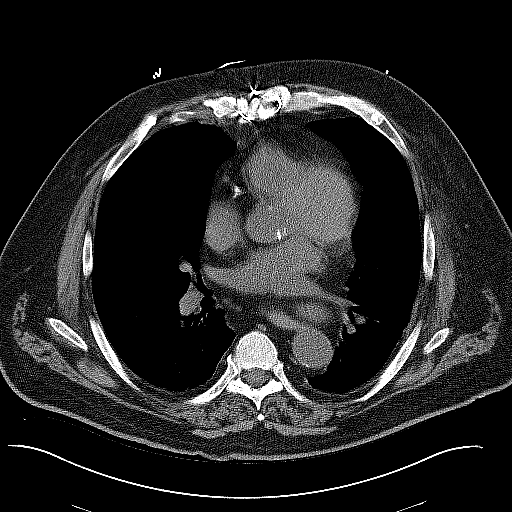
[im 31/64  lung]
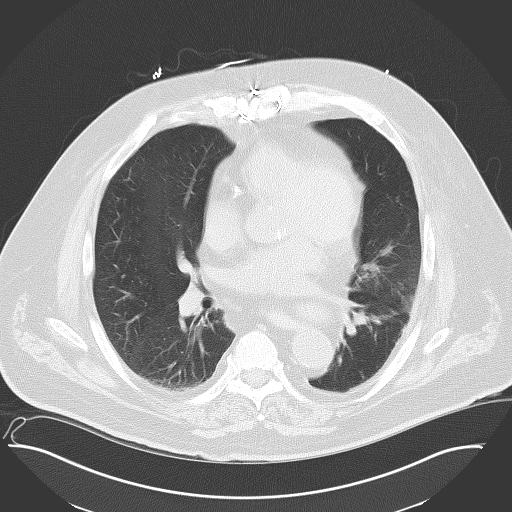
[im 36/64  lung]
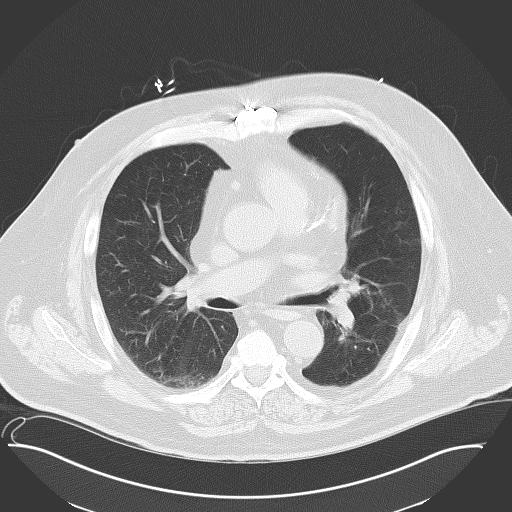
[im 43/64  lung]
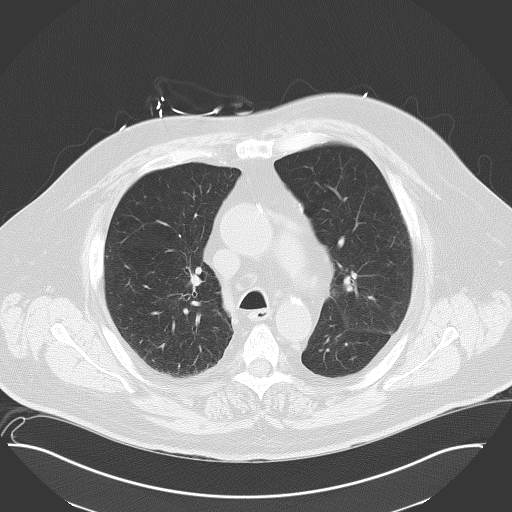
[im 50/64  lung]
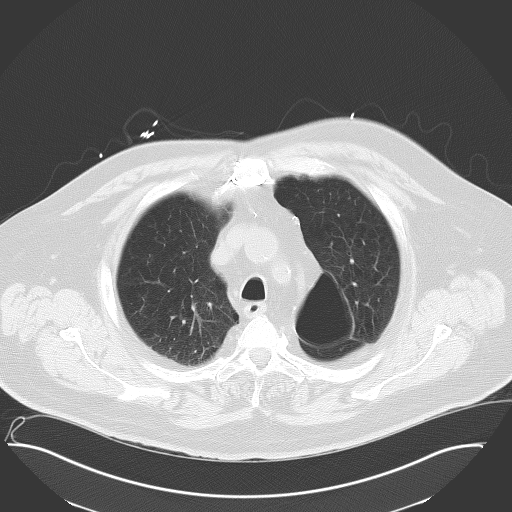
[im 57/64  mediastinal]
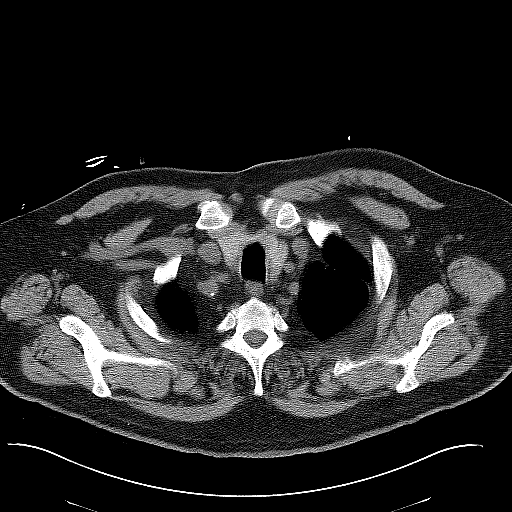
[im 57/64  lung]
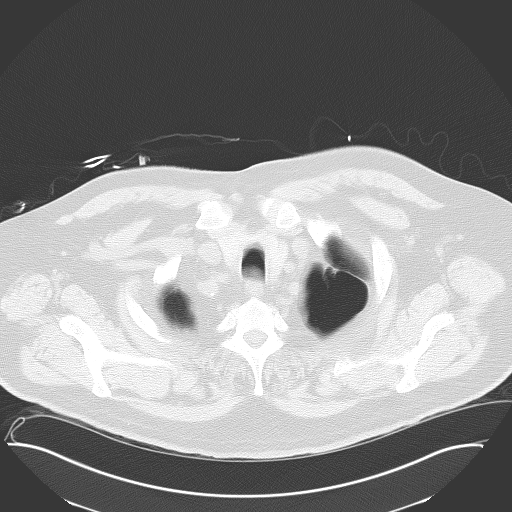

[15 of 33 positions shown; findings below may reference images not displayed]

FINDINGS: Mediastinum/Lymph Nodes: Heart size is normal. There is no
significant pericardial fluid, thickening or pericardial
calcification. There is atherosclerosis of the thoracic aorta, the
great vessels of the mediastinum and the coronary arteries,
including calcified atherosclerotic plaque in the left main, left
anterior descending and right coronary arteries. Status post median
sternotomy for CABG, including [REDACTED] to the LAD. No pathologically
enlarged mediastinal or hilar lymph nodes. Please note that accurate
exclusion of hilar adenopathy is limited on noncontrast CT scans.
Large hiatal hernia. No axillary lymphadenopathy.

Lungs/Pleura: There is some focal architectural distortion, what
appears to be chronic volume loss, and some mild peribronchovascular
ground-glass attenuation in the left lower lobe, which is favored to
reflect a combination of chronic scarring and atelectasis, as well
as some peribronchovascular inflammation and mild fibrosis,
potentially related to chronic recurrent aspiration in the setting
of this large hiatal hernia. High-resolution images otherwise
demonstrate no significant additional areas of ground-glass
attenuation, subpleural reticulation, parenchymal banding, traction
bronchiectasis or frank honeycombing in other portions of the lungs
to suggest a background of interstitial lung disease. Inspiratory
and expiratory imaging is unremarkable. Mild diffuse bronchial wall
thickening with generally mild centrilobular and paraseptal
emphysema. There is a large bulla in the apex of the left upper
lobe. No acute consolidative airspace disease. No pleural effusions.
No suspicious appearing pulmonary nodules or masses.

Upper Abdomen: Unremarkable.

Musculoskeletal/Soft Tissues: Median sternotomy wires. There are no
aggressive appearing lytic or blastic lesions noted in the
visualized portions of the skeleton.
IMPRESSION: 1. There are no findings to strongly suggest interstitial lung
disease on today's examination.
2. There is a spectrum of findings in the left lower lobe,
suggestive of some chronic scarring, atelectasis and likely mild
fibrosis related to recurrent aspiration or prior infection, as
discussed above.
3. Large hiatal hernia.
4. Mild diffuse bronchial wall thickening with mild centrilobular
and paraseptal emphysema, as well as a large left apical bulla.
5. Atherosclerosis, including left main and 3 vessel coronary artery
disease. Status post median sternotomy for CABG, including [REDACTED] to
the LAD.

## 2015-12-05 IMAGING — CT CT ANGIO CHEST
1 of 2 series · 18 of 30 positions shown · IV contrast (APPLIED)
Comparison: 04/23/2015

CLINICAL DATA: Increase shortness of breath of the last 2 days.
Dizziness. History of CABG, COPD, AAA repair, diabetes, CHF, CAD,
AVM.

EXAM:
CT ANGIOGRAPHY CHEST WITH CONTRAST
TECHNIQUE: Multidetector CT imaging of the chest was performed using the
standard protocol during bolus administration of intravenous
contrast. Multiplanar CT image reconstructions and MIPs were
obtained to evaluate the vascular anatomy.
CONTRAST:  86mL OMNIPAQUE IOHEXOL 350 MG/ML SOLN

[Series 5: pe 1.0 thins · axial · 0.76mm/px · z∈[-809,-517]mm · 18 of 330 slices shown]
[im 19/330  lung]
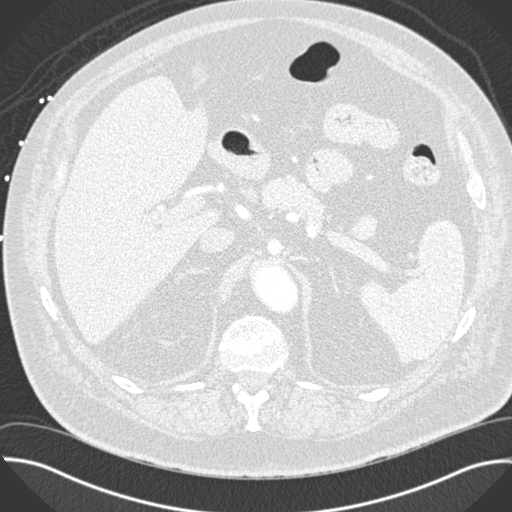
[im 37/330  mediastinal]
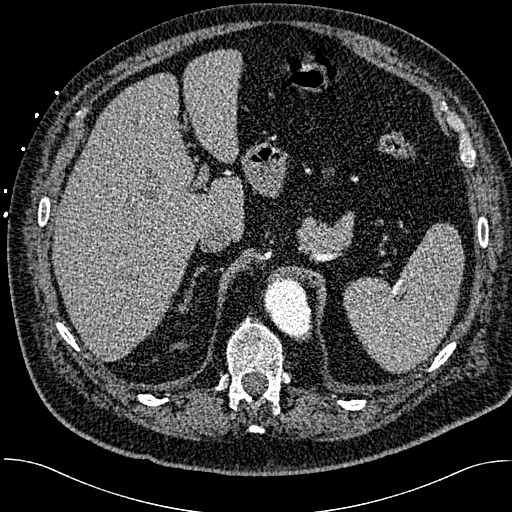
[im 55/330  lung]
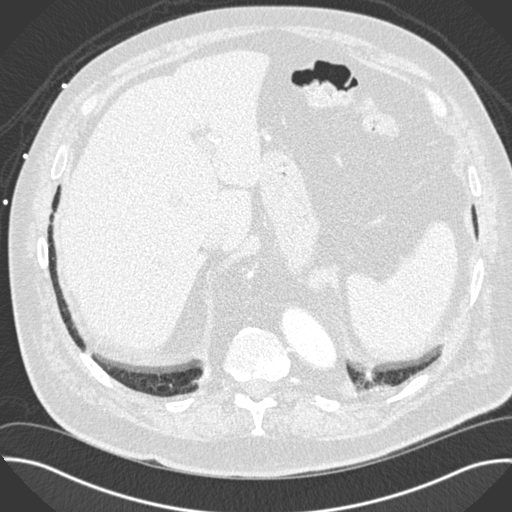
[im 74/330  mediastinal]
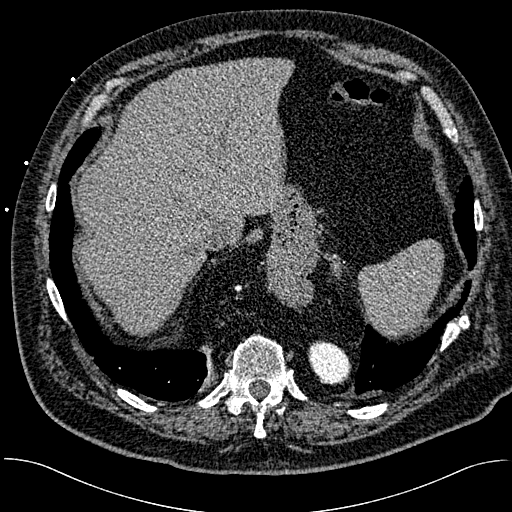
[im 92/330  lung]
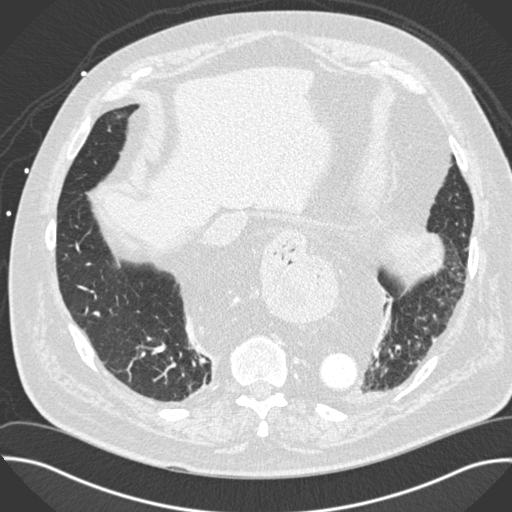
[im 110/330  mediastinal]
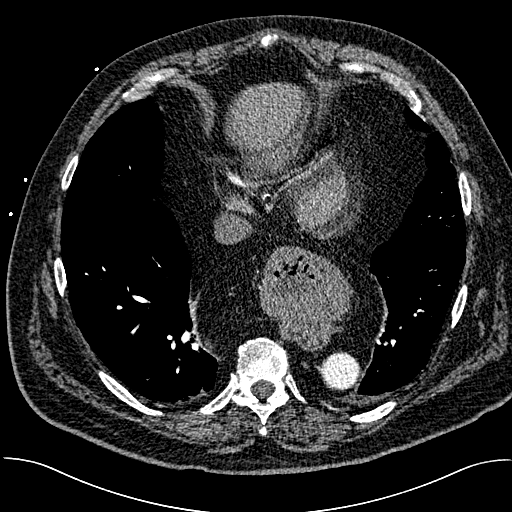
[im 128/330  lung]
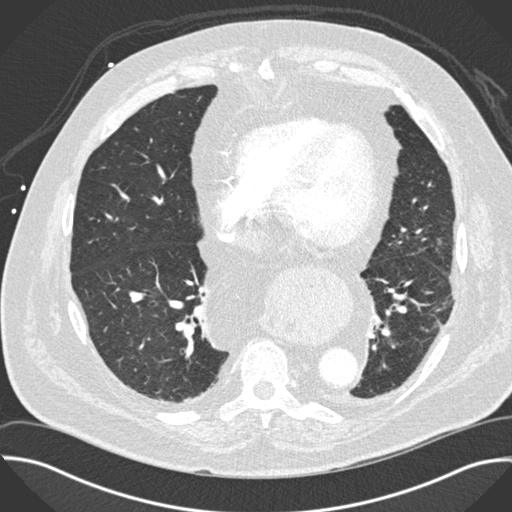
[im 147/330  mediastinal]
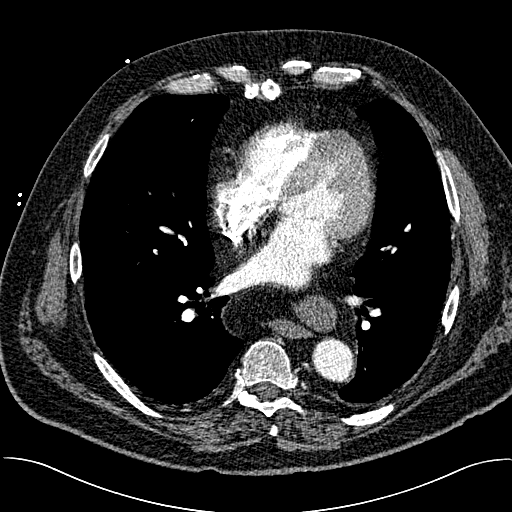
[im 156/330  lung]
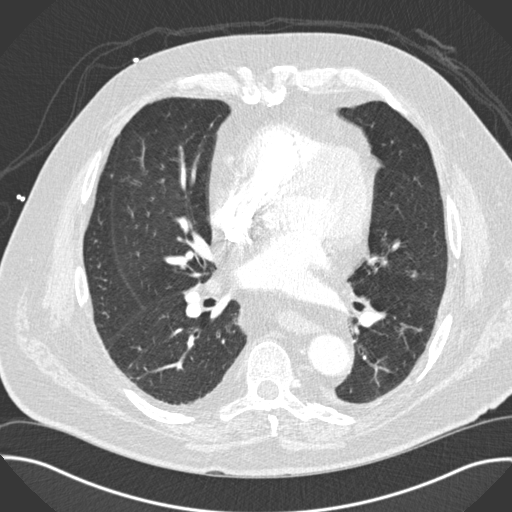
[im 165/330  mediastinal]
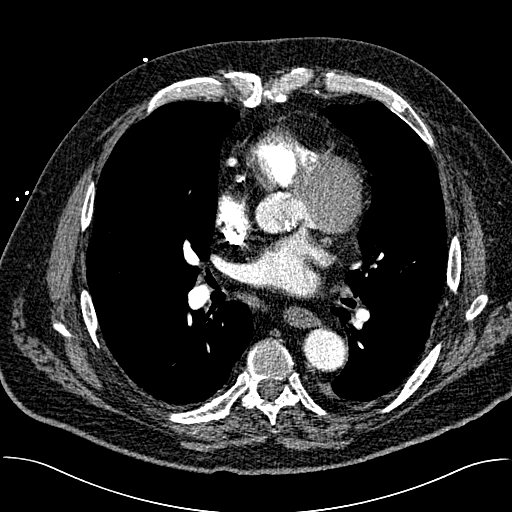
[im 183/330  lung]
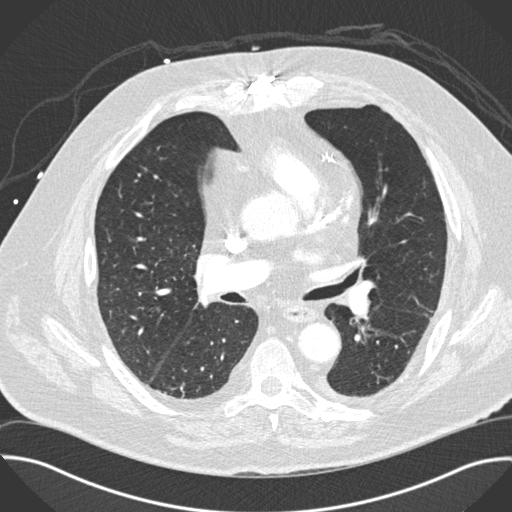
[im 202/330  mediastinal]
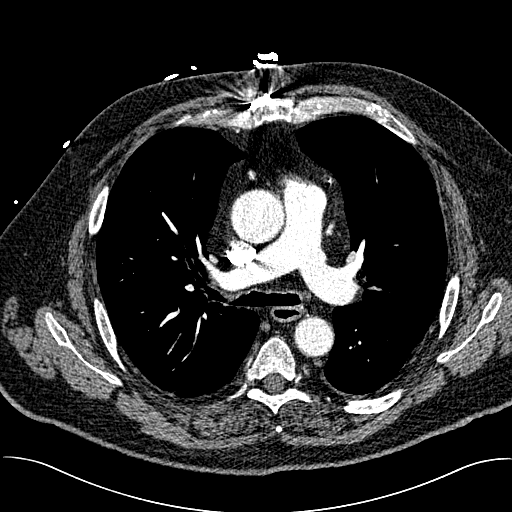
[im 220/330  lung]
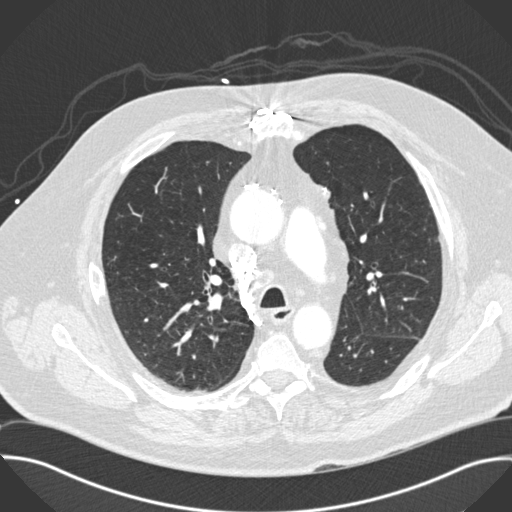
[im 238/330  mediastinal]
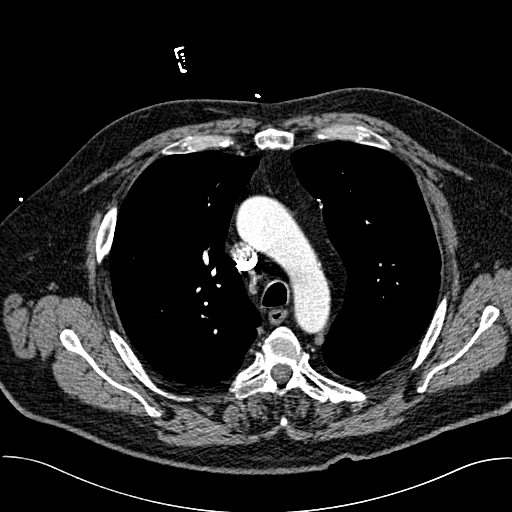
[im 256/330  lung]
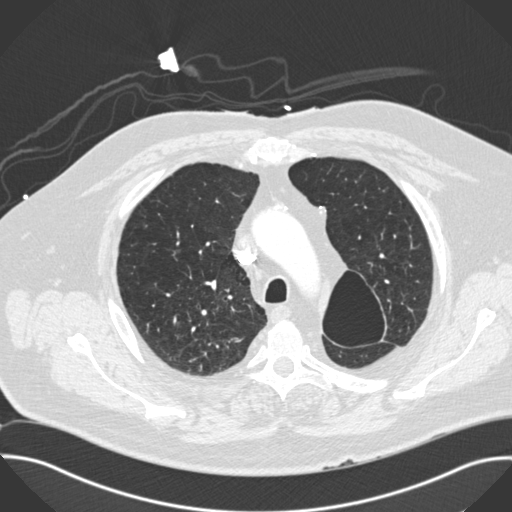
[im 275/330  mediastinal]
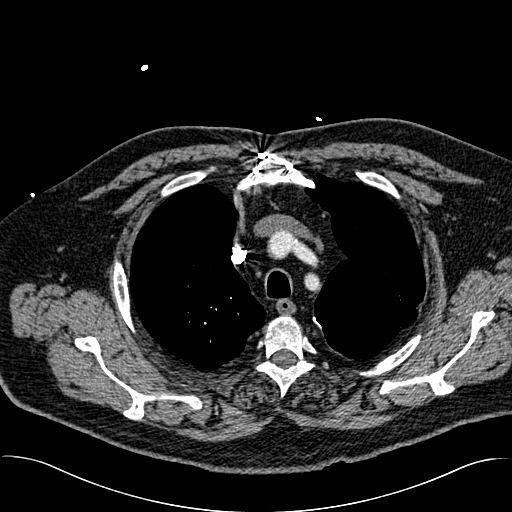
[im 293/330  lung]
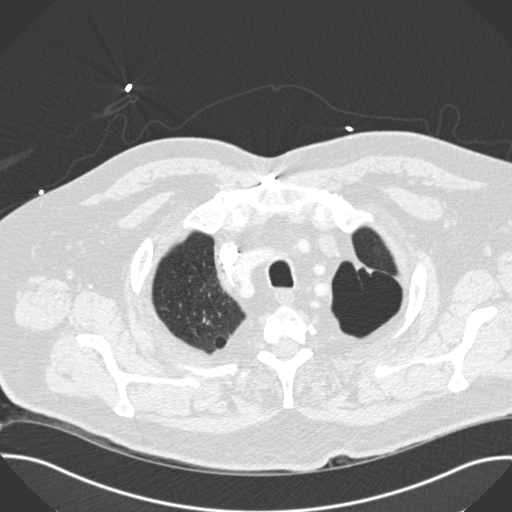
[im 311/330  mediastinal]
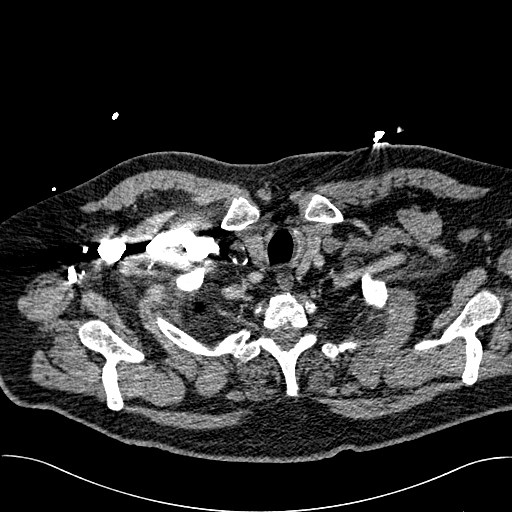

[18 of 30 positions shown; findings below may reference images not displayed]

FINDINGS: Heart: Heart is mildly enlarged. No pericardial effusion.
Significant coronary artery disease. Previous median sternotomy and
CABG.

Vascular structures: Pulmonary arteries are well opacified. There is
no evidence for acute pulmonary embolus. There is atherosclerotic
calcification of the thoracic arch not associated with aneurysm or
dissection.

Mediastinum/thyroid: The visualized portion of the thyroid gland has
a normal appearance. Small mediastinal lymph nodes are noted,
nonspecific in appearance. Large hiatal hernia is present.

Lungs/Airways: Emphysematous changes are present. Large bulla in the
left lung apex again noted. No focal consolidations or pleural
effusions. There is mild bronchiectasis in the left lung base. No
suspicious pulmonary nodules.

Upper abdomen: Large hiatal hernia.  Colonic diverticulosis.

Chest wall/osseous structures: Status post median sternotomy. No
suspicious lytic or blastic lesions are identified. Chronic left rib
fracture.

Review of the MIP images confirms the above findings.
IMPRESSION: 1. Technically adequate exam showing no pulmonary embolus.
2. Cardiomegaly and significant coronary artery disease.
3. Emphysematous changes.  Bronchiectasis left lung base.
4. Large hiatal hernia.
5. Colonic diverticulosis.

## 2015-12-05 IMAGING — CR DG CHEST 1V PORT
1 series · 1 of 1 positions shown · non-contrast
Comparison: Chest radiograph April 22, 2015 and, CT chest April 23, 2015

CLINICAL DATA: Worsening shortness of breath. History of COPD,
chest pain, CHF, diabetes, hypertension.

EXAM:
PORTABLE CHEST - 1 VIEW

[portable]
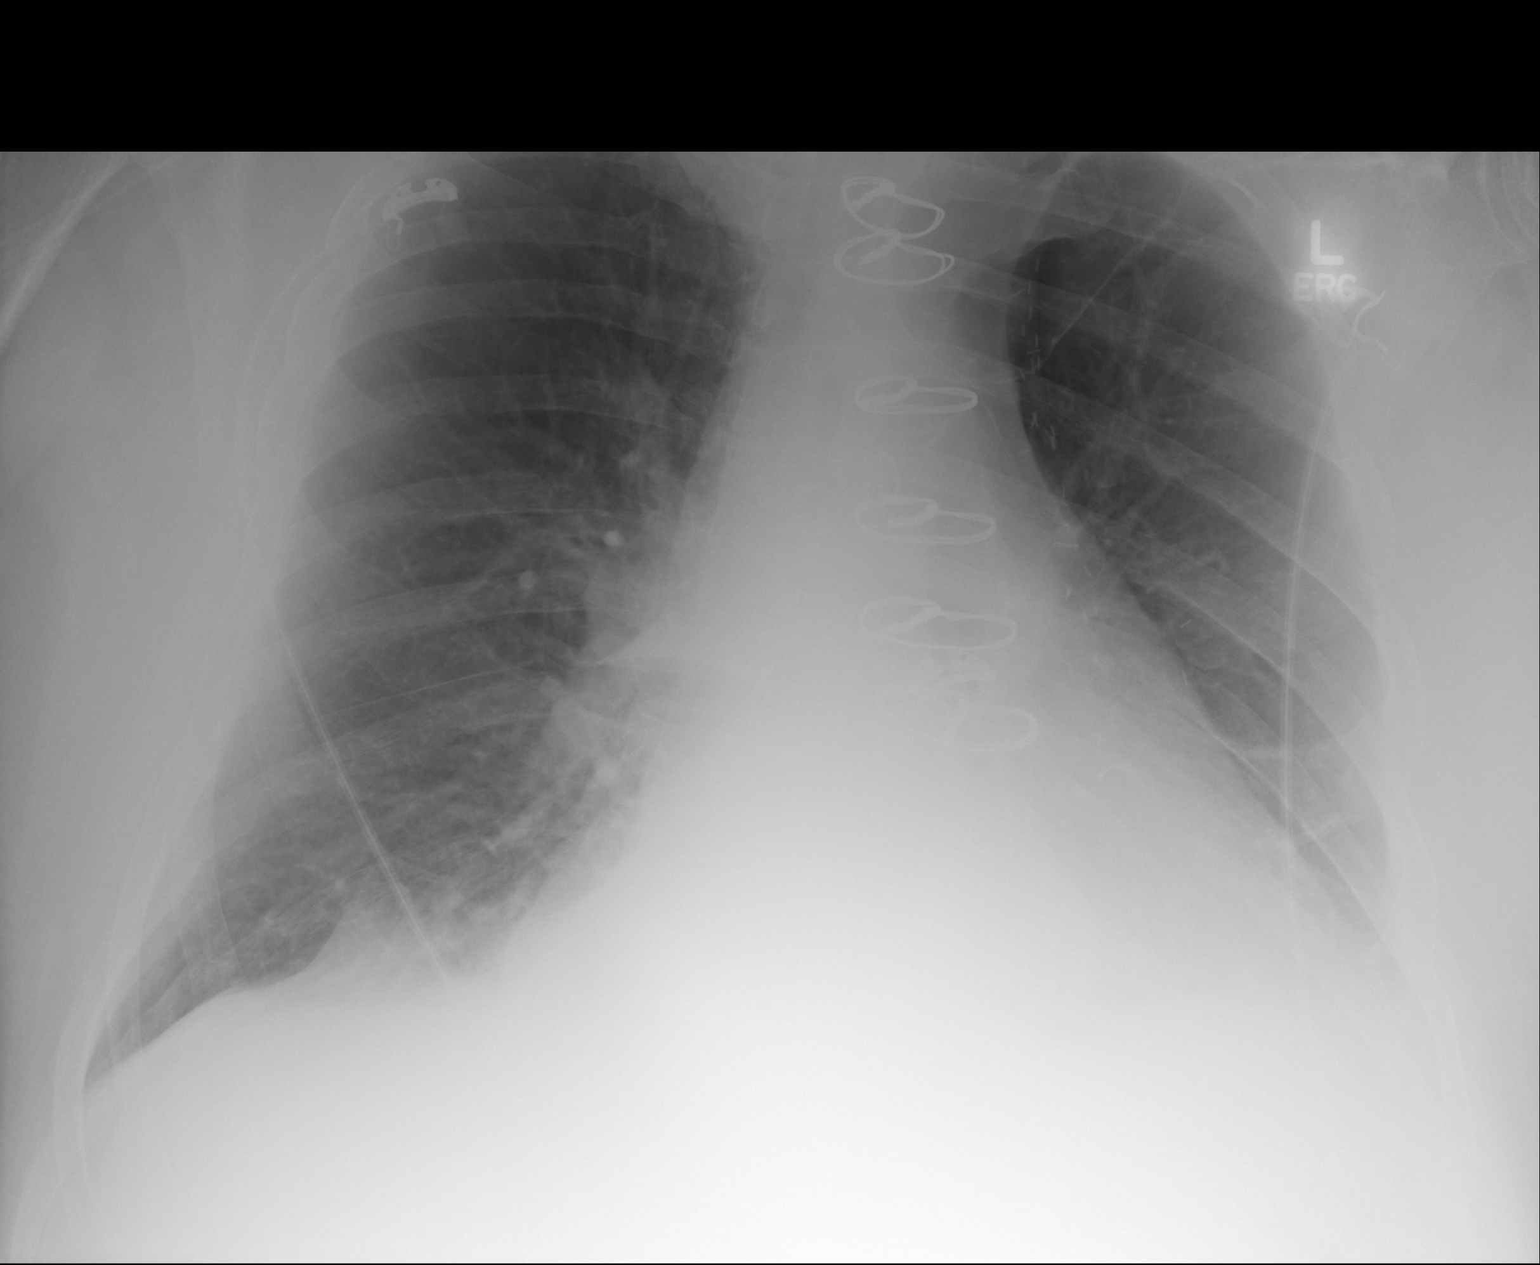

[1 of 1 positions shown; findings below may reference images not displayed]

FINDINGS: The cardiac silhouette appears moderately enlarged, status post
median sternotomy. Increased lung volumes, consistent with history
of COPD without pleural effusion or focal consolidation. Stable LEFT
lung base atelectasis. Apical pleural thickening. No pneumothorax.
Soft tissue planes and included osseous structure nonsuspicious.
IMPRESSION: Stable cardiomegaly and COPD.  Stable LEFT lung base atelectasis.

## 2015-12-08 IMAGING — CR DG CHEST 1V PORT
1 series · 1 of 1 positions shown · non-contrast
Comparison: Chest radiograph and chest CT May 02, 2014

CLINICAL DATA: Shortness of Breath

EXAM:
PORTABLE CHEST - 1 VIEW

[portable]
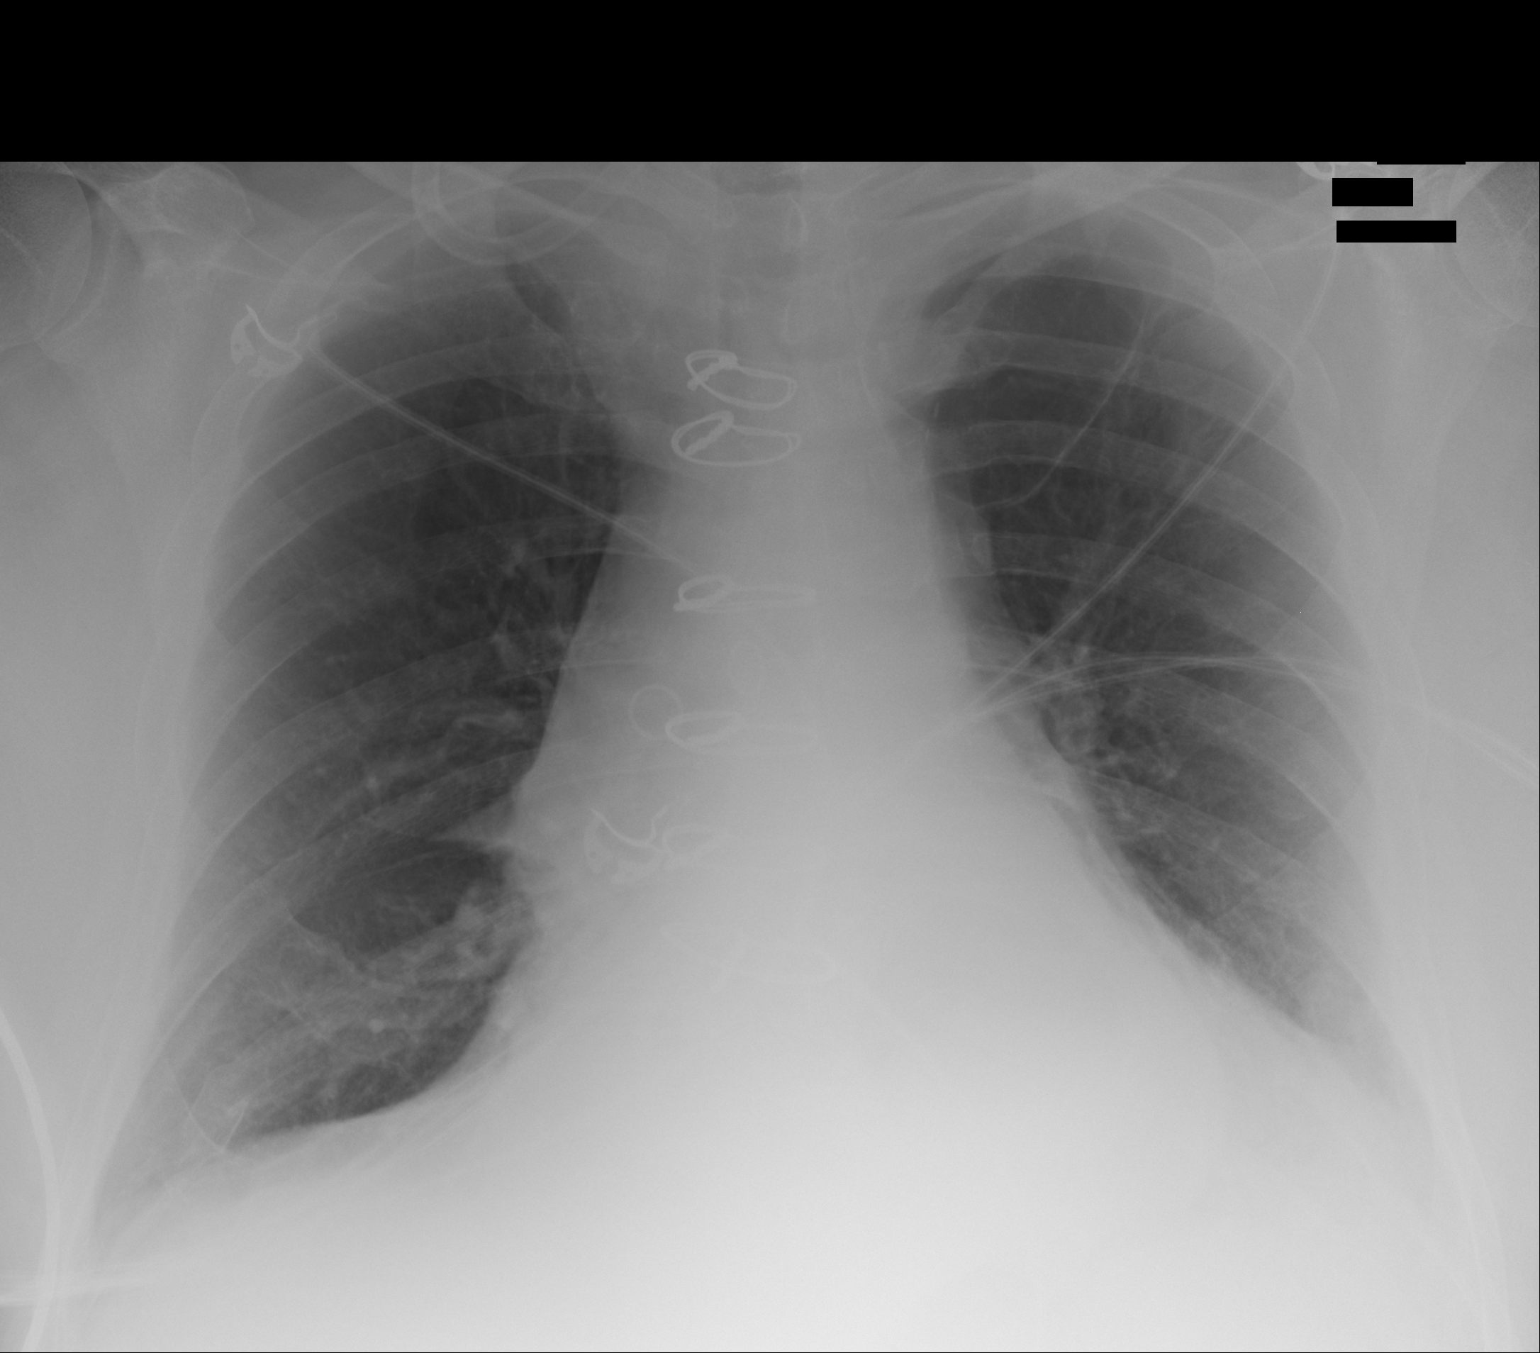

[1 of 1 positions shown; findings below may reference images not displayed]

FINDINGS: There is a stable large bulla in the left upper lobe. There is
flattening of the hemidiaphragms. There is no frank edema or
consolidation. The heart is mildly enlarged with pulmonary
vascularity within normal limits. There is a sizable hiatal type
hernia. Patient is status post coronary artery bypass grafting. No
adenopathy.
IMPRESSION: Underlying bullous emphysematous change. No edema or consolidation.
Stable cardiac prominence. Sizable hiatal type hernia.

## 2015-12-10 IMAGING — CR DG CHEST 1V PORT
1 series · 1 of 1 positions shown · non-contrast
Comparison: 05/06/2015

CLINICAL DATA: PICC line placement.

EXAM:
PORTABLE CHEST - 1 VIEW

[ap]
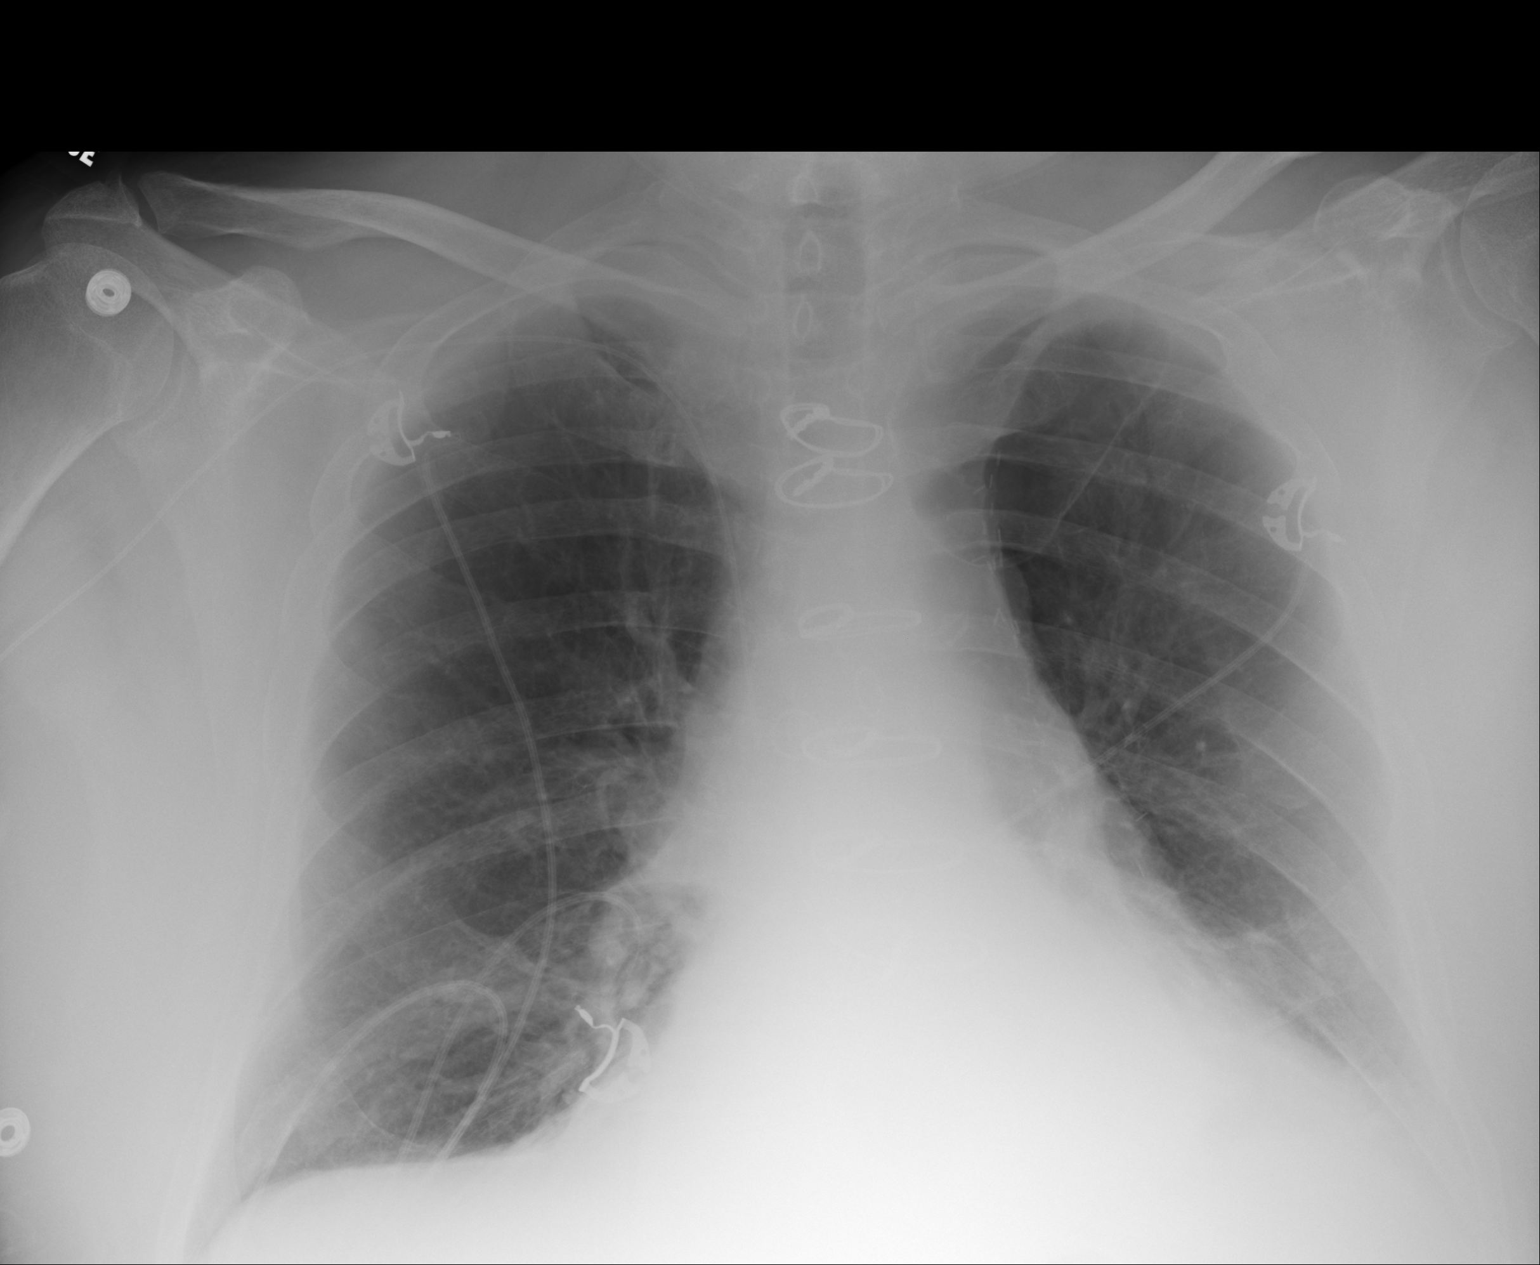

[1 of 1 positions shown; findings below may reference images not displayed]

FINDINGS: Sequelae of prior CABG are again identified. Right PICC has been
placed and terminates over the lower SVC. Cardiomediastinal
silhouette is unchanged, with increased density in the lower
mediastinum consistent with known hiatal hernia. Bullous change is
again seen in the left lung apex. Mild left basilar opacity remains,
likely atelectasis or scarring. There is no evidence of pulmonary
edema, sizable pleural effusion, or pneumothorax. No acute osseous
abnormality is seen.
IMPRESSION: 1. Right PICC terminates over the lower SVC.
2. No evidence of acute cardiopulmonary process.

## 2015-12-21 IMAGING — CR DG CHEST 1V PORT
1 series · 1 of 1 positions shown · non-contrast
Comparison: 05/08/2015 chest x-ray.  05/03/2015 chest CT.

CLINICAL DATA: 74-year-old male with chest pain and shortness of
breath. Subsequent encounter.

EXAM:
PORTABLE CHEST - 1 VIEW

[ap]
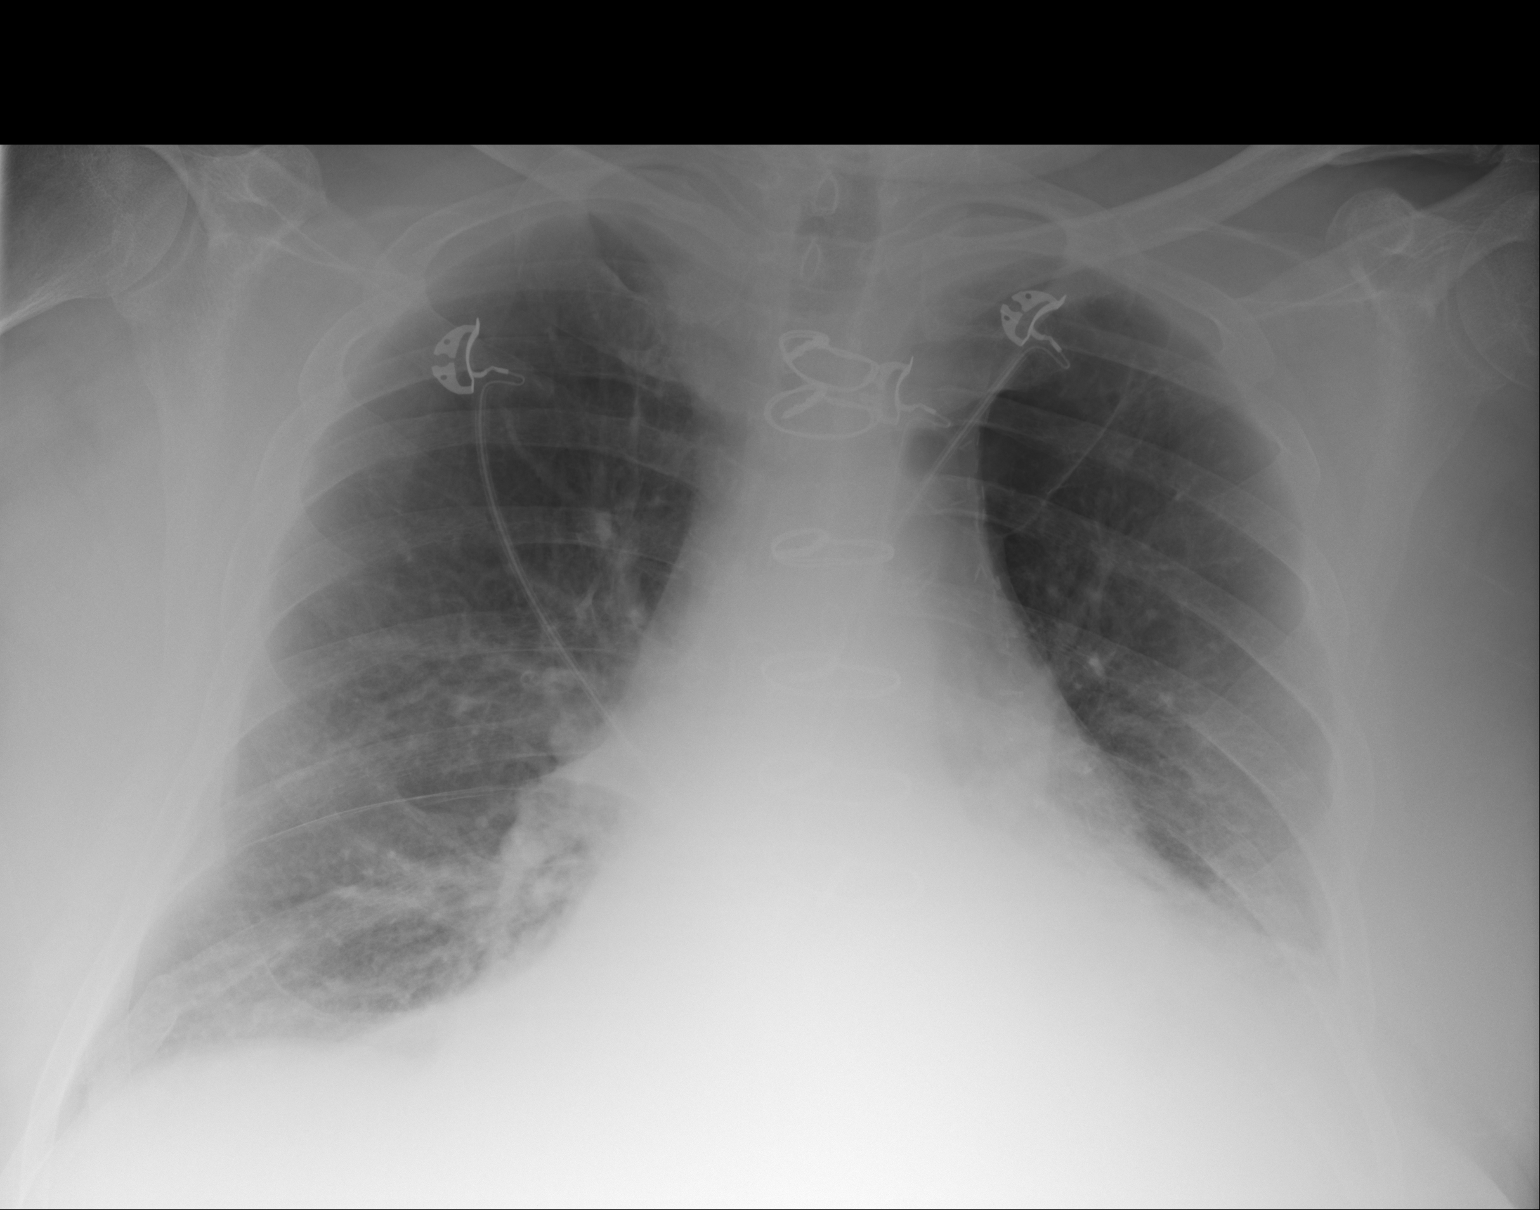

[1 of 1 positions shown; findings below may reference images not displayed]

FINDINGS: Right PICC line has been removed.

Post median sternotomy with cardiomegaly.  Hiatal hernia.

Limited evaluation lung bases particularly on the left with similar
appearance to prior plain film exam.

Central pulmonary vascular prominence without pulmonary edema.

No gross pneumothorax.
IMPRESSION: Post median sternotomy with cardiomegaly.

Hiatal hernia.

Limited evaluation lung bases particularly on the left with similar
appearance to prior plain film exam.

Central pulmonary vascular prominence without pulmonary edema.

## 2015-12-21 IMAGING — CT CT HEAD W/O CM
1 of 3 series · 15 of 30 positions shown, 19 images · non-contrast
Comparison: None.

CLINICAL DATA: Altered mental status

EXAM:
CT HEAD WITHOUT CONTRAST
TECHNIQUE: Contiguous axial images were obtained from the base of the skull
through the vertex without intravenous contrast.

[Series 2: head wo · axial · 0.45mm/px · z∈[-166,-22]mm · 15 of 36 slices shown, 19 images]
[im 2/36  brain]
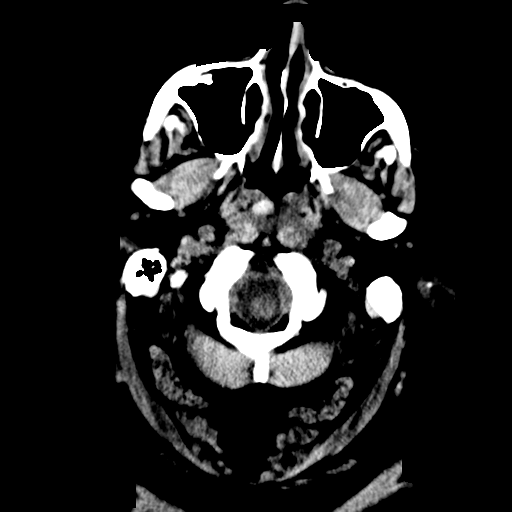
[im 2/36  bone]
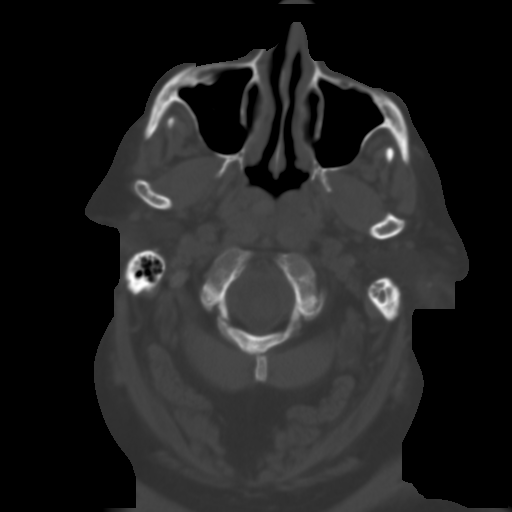
[im 4/36  brain]
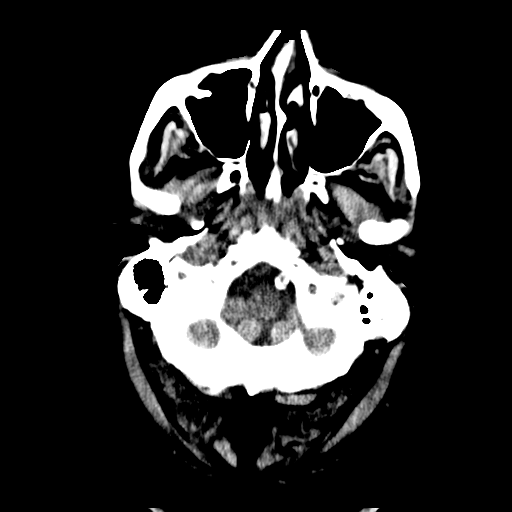
[im 8/36  brain]
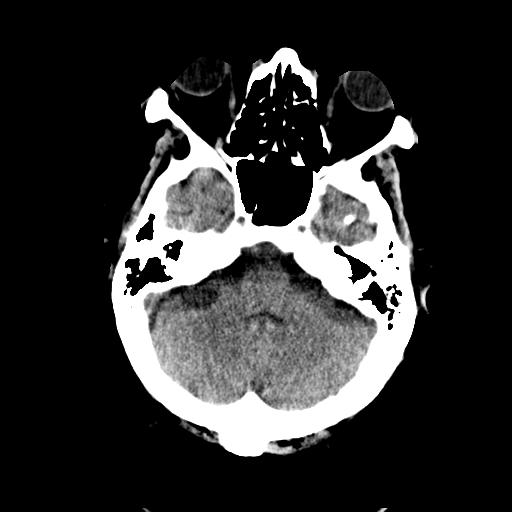
[im 10/36  brain]
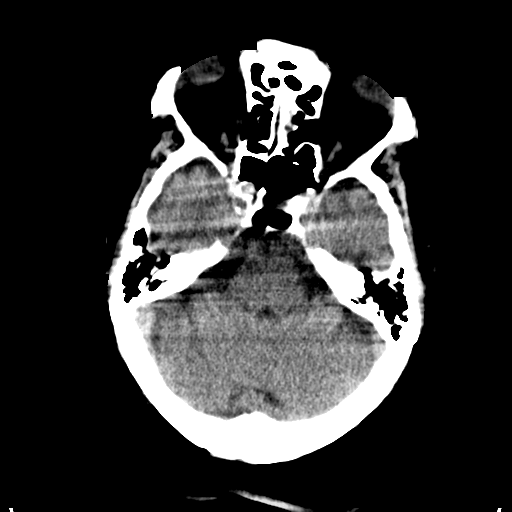
[im 12/36  brain]
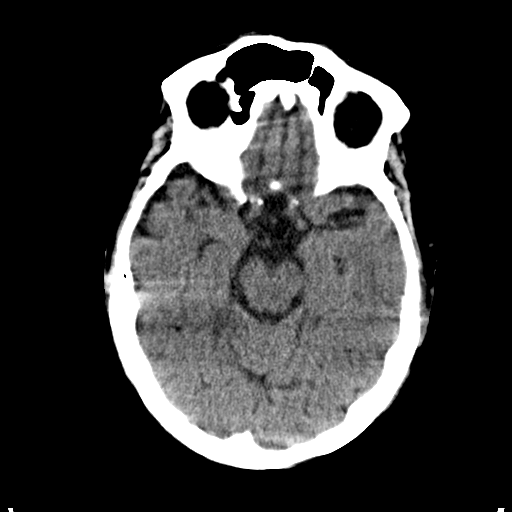
[im 12/36  bone]
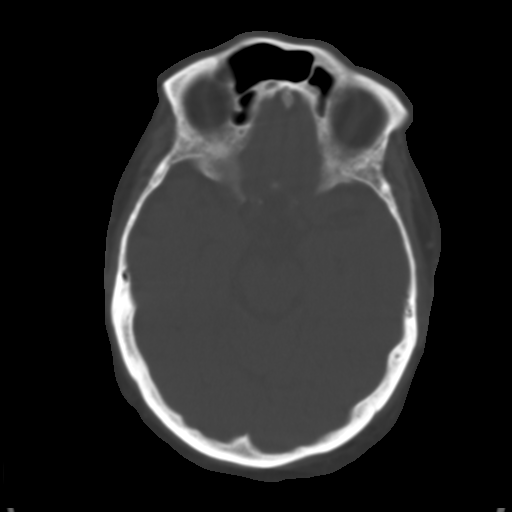
[im 13/36  brain]
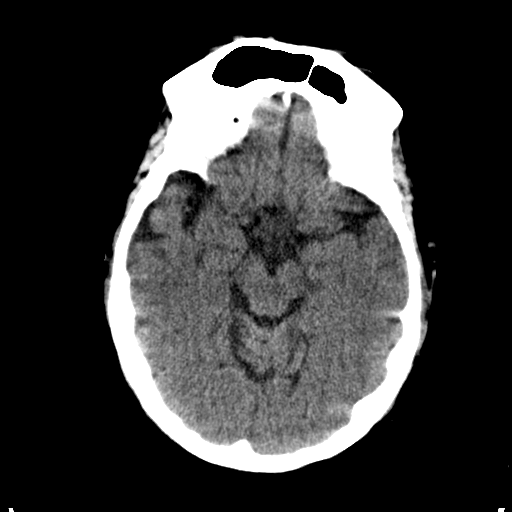
[im 15/36  brain]
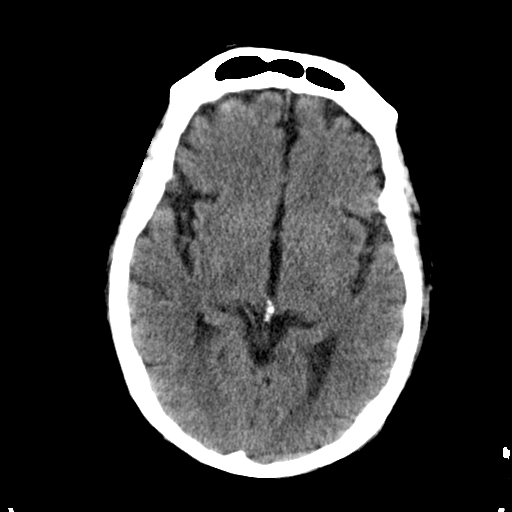
[im 19/36  brain]
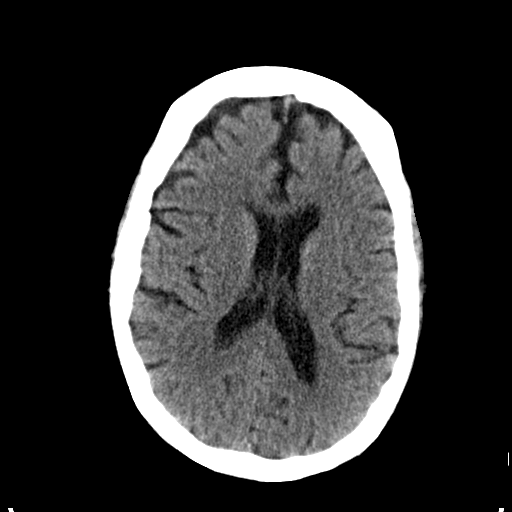
[im 21/36  brain]
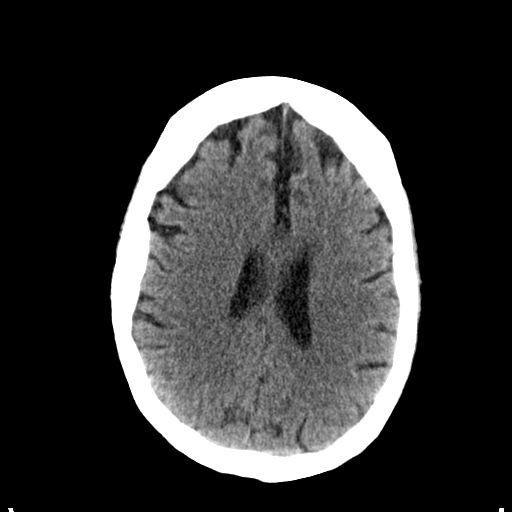
[im 21/36  bone]
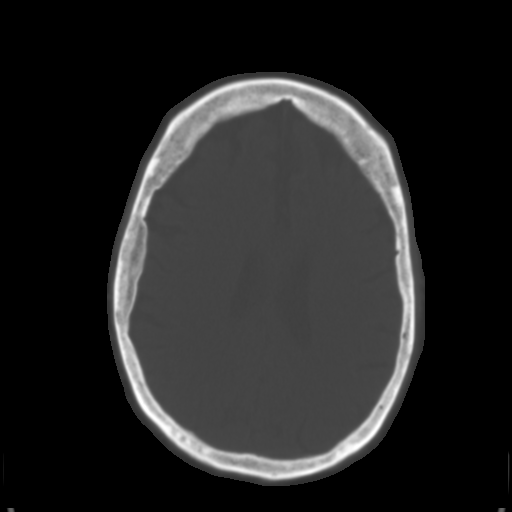
[im 23/36  brain]
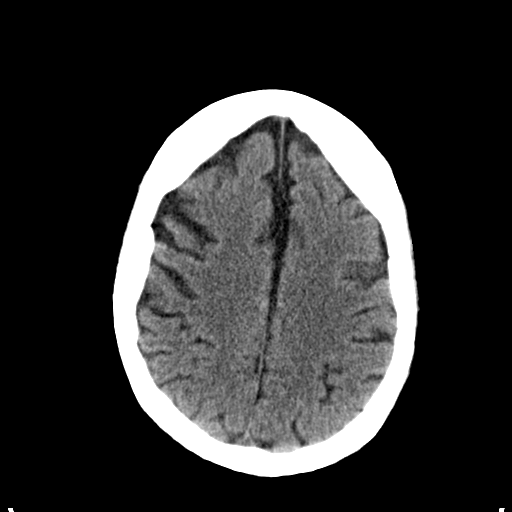
[im 24/36  brain]
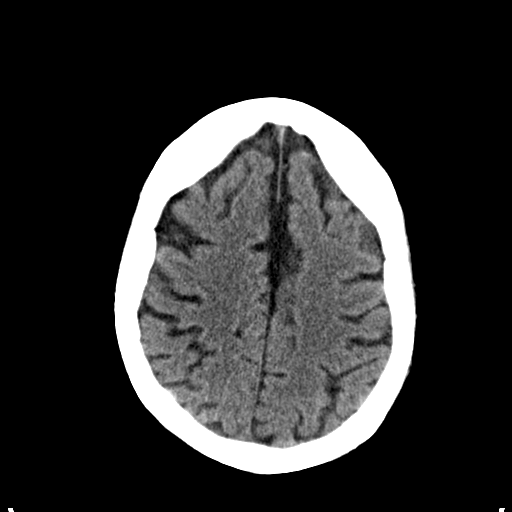
[im 26/36  brain]
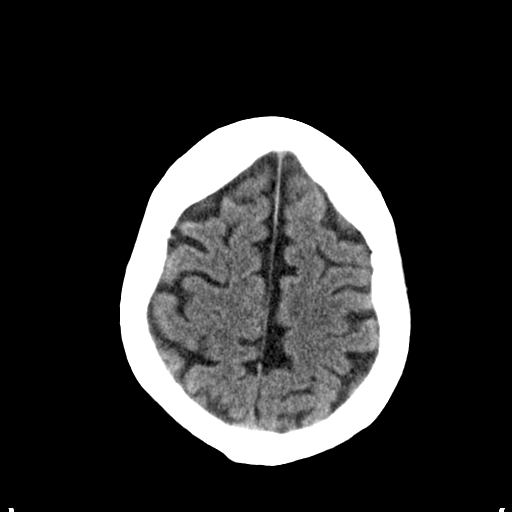
[im 30/36  brain]
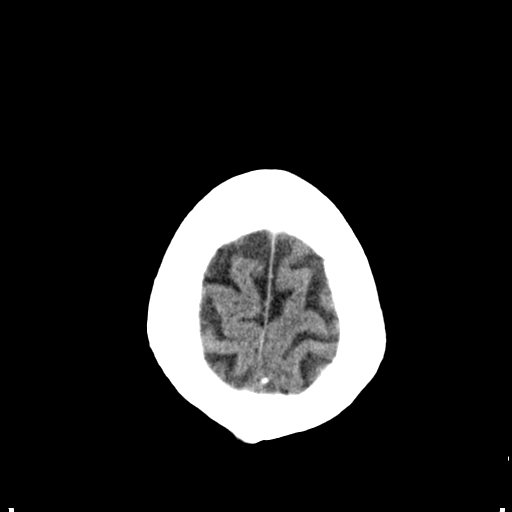
[im 30/36  bone]
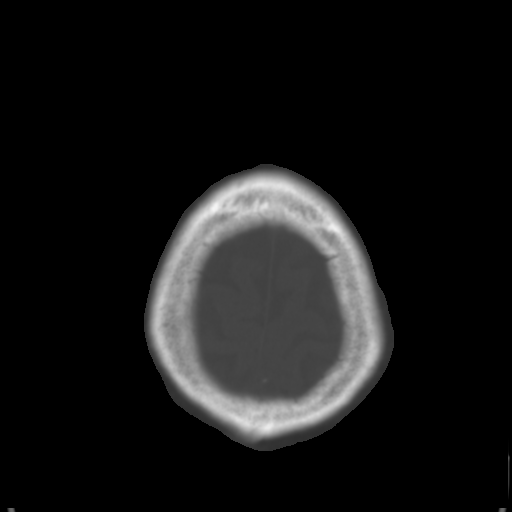
[im 32/36  brain]
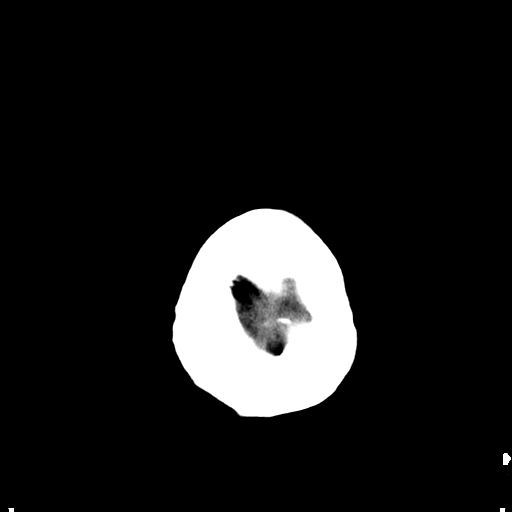
[im 34/36  brain]
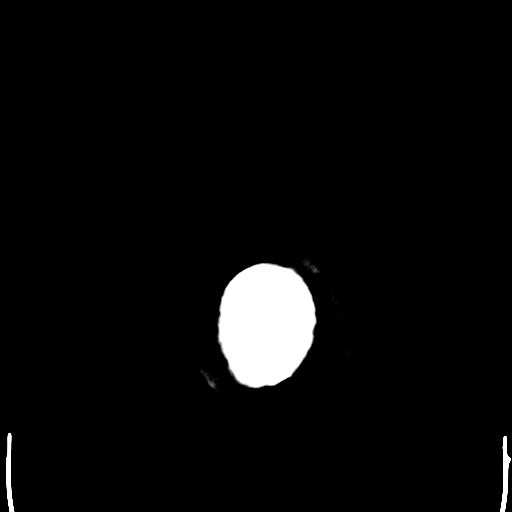

[15 of 30 positions shown; findings below may reference images not displayed]

FINDINGS: There is age related volume loss. There is no intracranial mass,
hemorrhage, extra-axial fluid collection, or midline shift. There is
slight small vessel disease in the centra semiovale bilaterally.
Elsewhere gray-white compartments appear normal. No acute infarct
evident. The bony calvarium appears intact. The mastoid air cells on
the right are clear. There is opacification of multiple mastoid air
cells on the left with an air-fluid level in a posterior mastoid air
cell on the left.
IMPRESSION: Age related volume loss with mild periventricular small vessel
disease. No intracranial mass, hemorrhage, or evidence of acute
infarct. Mastoid disease on the left with an air-fluid level as well
as opacification of multiple left-sided mastoid air cells.

## 2015-12-25 IMAGING — CR DG CHEST 1V
1 series · 1 of 1 positions shown · non-contrast
Comparison: 05/19/2015

CLINICAL DATA: Hypoxia

EXAM:
CHEST  1 VIEW

[ap]
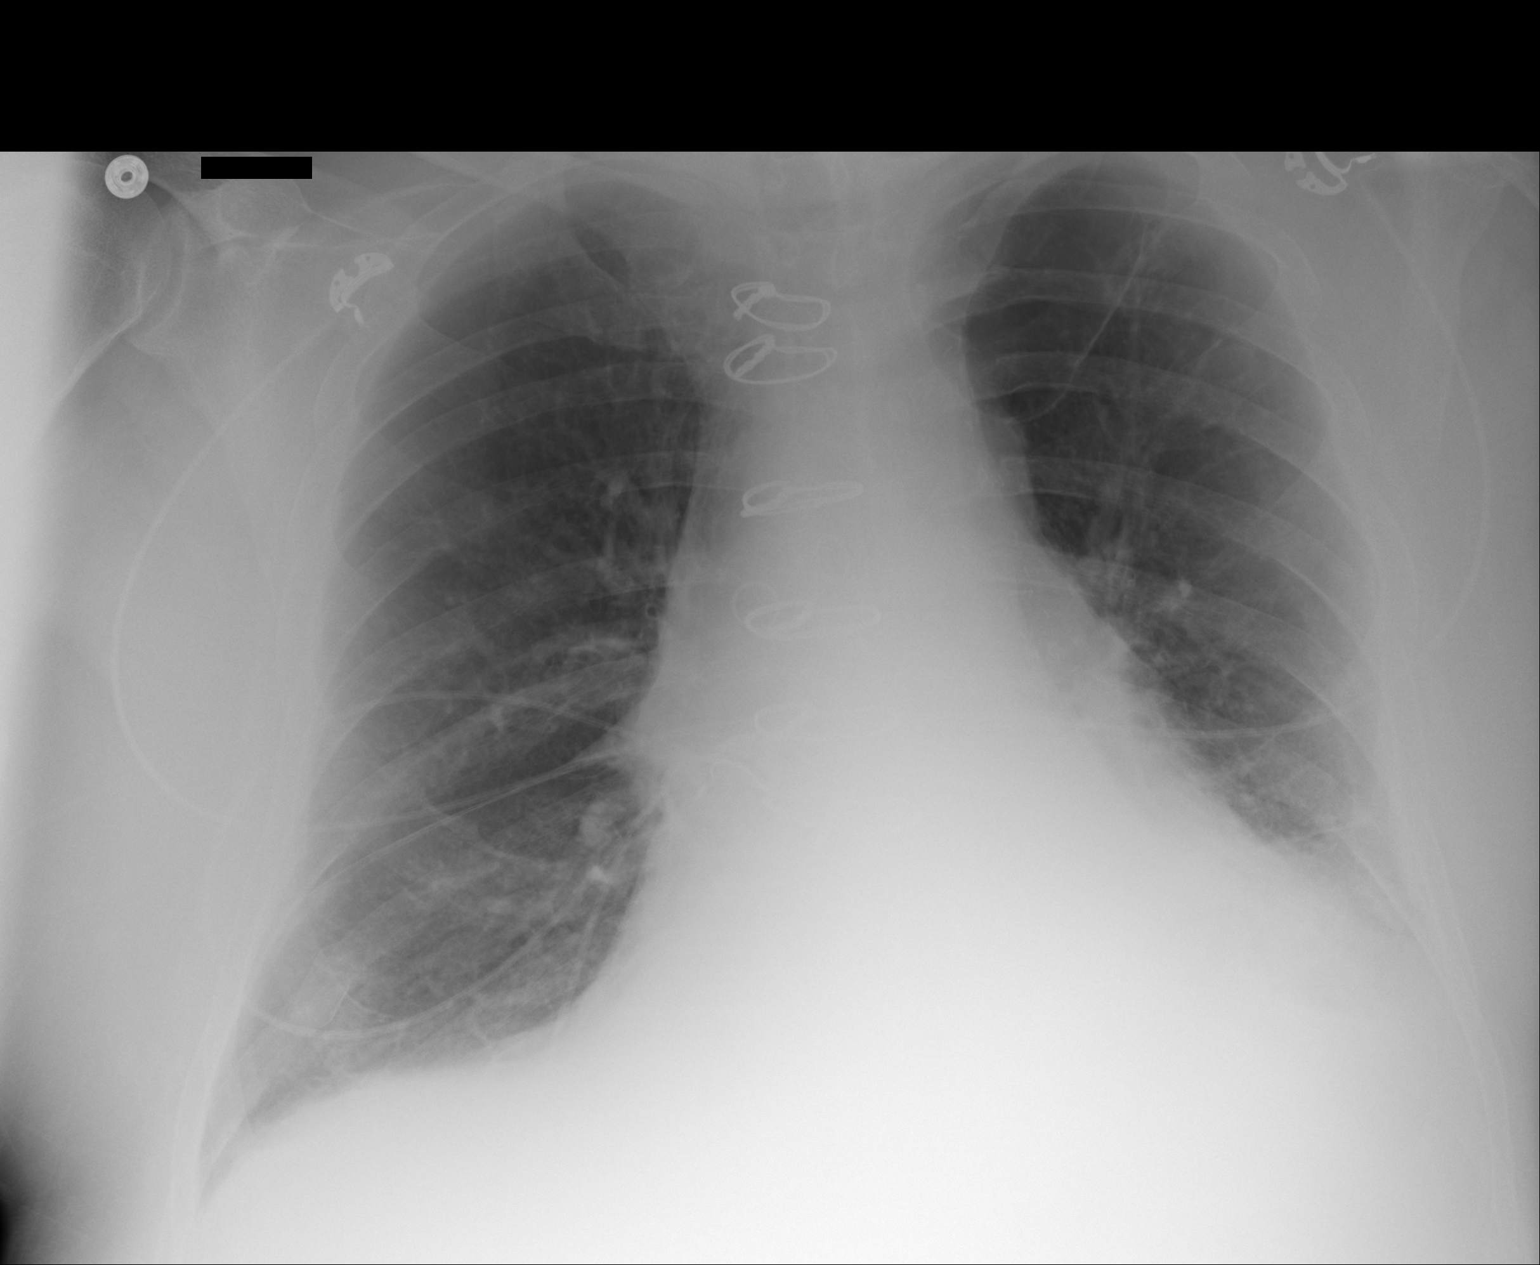

[1 of 1 positions shown; findings below may reference images not displayed]

FINDINGS: Enlarged cardiac silhouette stable. Stable left lower lobe
consolidation. Vascular pattern normal. Bullous change left apex
stable. Mild central bronchitic change stable. Status post median
sternotomy/ CABG.
IMPRESSION: Continued retrocardiac opacification unchanged from prior study. No
significant change when compared to 05/19/2015.

## 2015-12-30 ENCOUNTER — Encounter: Payer: Self-pay | Admitting: Surgery

## 2016-01-01 ENCOUNTER — Telehealth: Payer: Self-pay | Admitting: Pulmonary Disease

## 2016-01-01 ENCOUNTER — Other Ambulatory Visit: Payer: Self-pay | Admitting: *Deleted

## 2016-01-01 DIAGNOSIS — Z01812 Encounter for preprocedural laboratory examination: Secondary | ICD-10-CM

## 2016-01-01 NOTE — Telephone Encounter (Signed)
lmomtcb x1 for pt 

## 2016-01-04 ENCOUNTER — Ambulatory Visit: Payer: Commercial Managed Care - HMO | Admitting: Surgery

## 2016-01-04 ENCOUNTER — Inpatient Hospital Stay: Admission: RE | Admit: 2016-01-04 | Payer: Commercial Managed Care - HMO | Source: Ambulatory Visit

## 2016-01-04 NOTE — Telephone Encounter (Signed)
LMTCB x2  

## 2016-01-05 ENCOUNTER — Ambulatory Visit: Payer: Medicare HMO | Admitting: Sports Medicine

## 2016-01-05 NOTE — Telephone Encounter (Signed)
Left message for patient to call back  

## 2016-01-06 NOTE — Telephone Encounter (Signed)
Attempted to contact pt and got voicemail immediately. Left message asking pt to return the call and leave a new message. Per triage protocol this message will be closed as we have attempted to contact pt 4 times.

## 2016-01-15 ENCOUNTER — Ambulatory Visit: Payer: Medicare HMO | Admitting: Sports Medicine

## 2016-02-01 IMAGING — CR DG CHEST 1V PORT
1 series · 1 of 1 positions shown · non-contrast
Comparison: May 23, 2015

CLINICAL DATA: Weakness and cough for 10 days

EXAM:
PORTABLE CHEST - 1 VIEW

[ap]
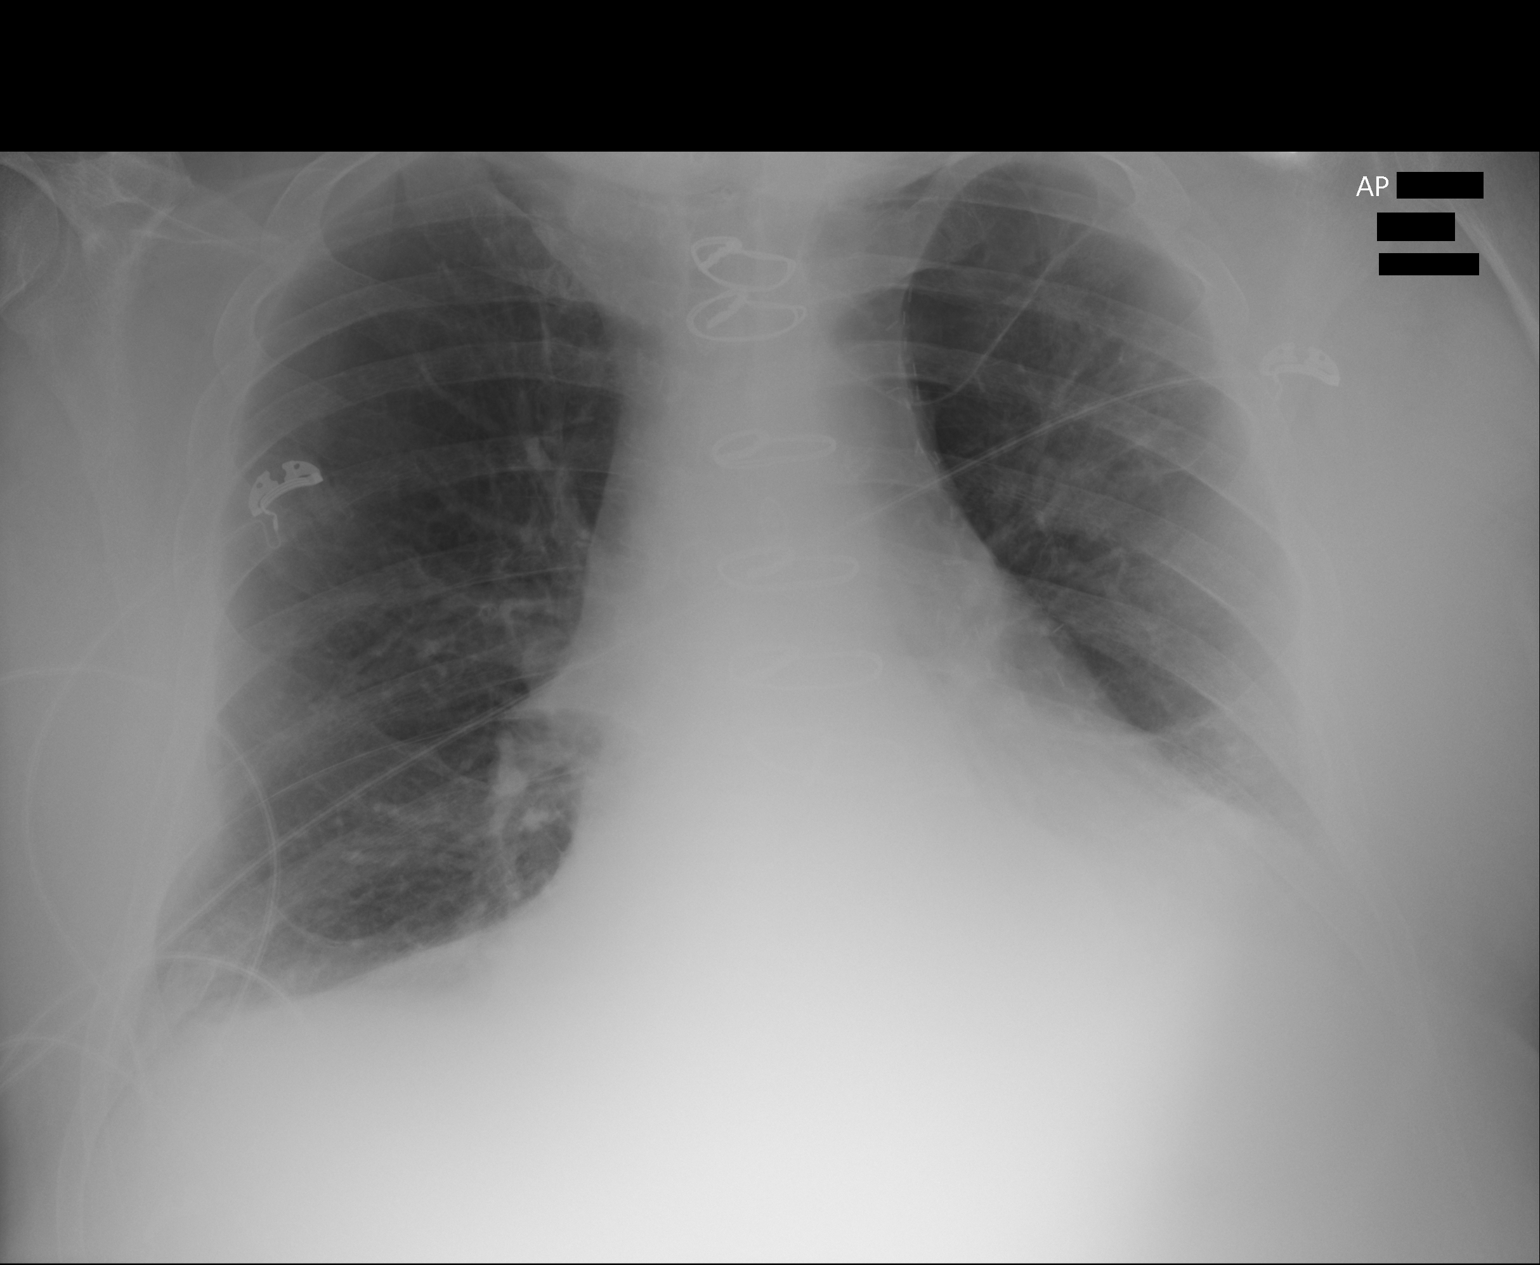

[1 of 1 positions shown; findings below may reference images not displayed]

FINDINGS: There is persistent consolidation in the left lower lobe region.
There is chronic central peribronchial thickening. No new opacity
seen. Heart is upper normal in size with pulmonary vascularity
within normal limits. No adenopathy.
IMPRESSION: Persistent left lower lobe consolidation. The chronicity of this
finding may warrant correlation with chest CT or bronchoscopy to
further assess the left lower lobe region. No new opacity. Evidence
a degree of underlying chronic bronchitis.

## 2016-02-01 IMAGING — CR DG CHEST 1V
1 series · 1 of 1 positions shown · non-contrast
Comparison: Portable chest x-ray earlier same date 7652 hr and
earlier.

CLINICAL DATA: Bedside PICC placement.

EXAM:
Portable CHEST  1 VIEW 8443 hr:

[ap]
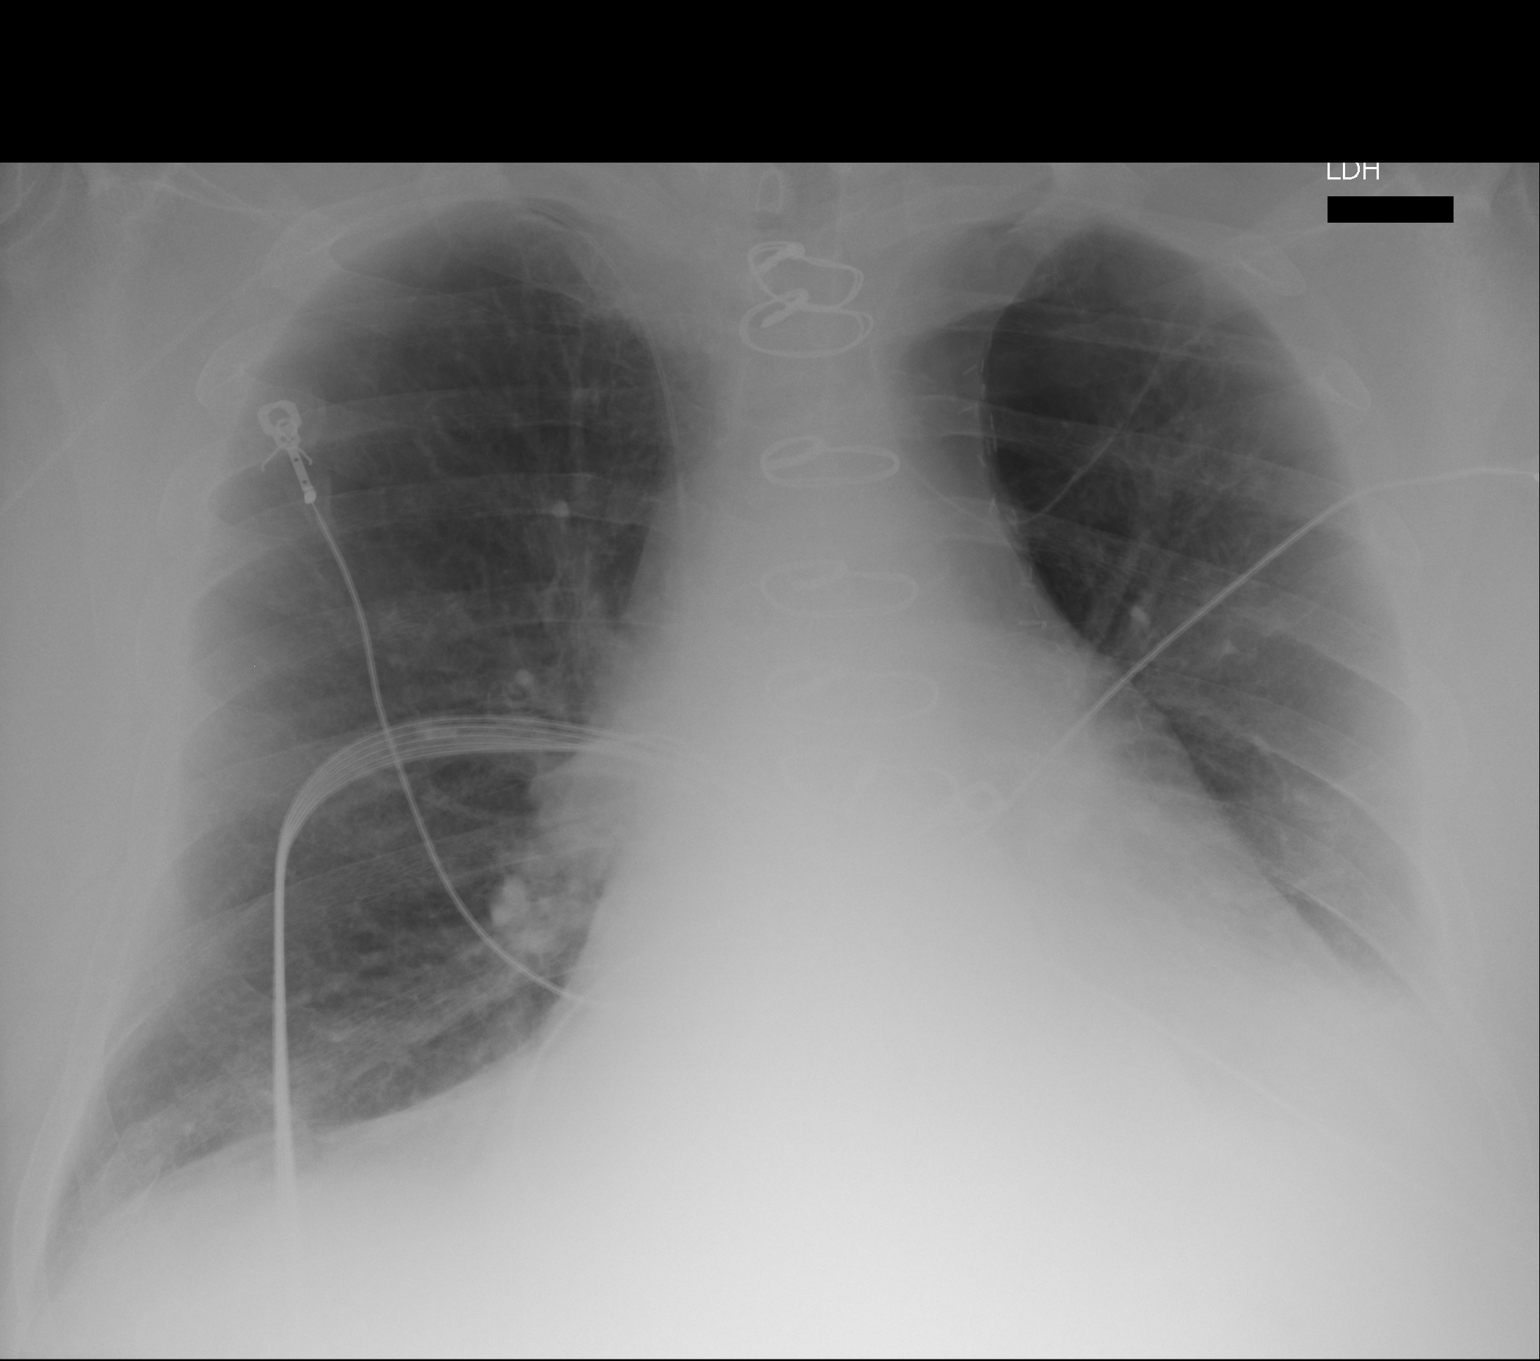

[1 of 1 positions shown; findings below may reference images not displayed]

FINDINGS: Right arm PICC tip projects over the lower SVC. Prior sternotomy.
Cardiac silhouette mildly to moderately enlarged. Emphysematous
changes in the upper lobes with a large bleb in the left apex. Dense
left lower lobe consolidation, unchanged. No new pulmonary
parenchymal abnormalities.
IMPRESSION: 1. Right arm PICC tip projects over the lower SVC.
2. Stable dense left lower lobe atelectasis and/or pneumonia. No new
abnormalities since earlier today.

## 2016-02-10 ENCOUNTER — Emergency Department: Payer: Medicare HMO

## 2016-02-10 ENCOUNTER — Inpatient Hospital Stay
Admission: EM | Admit: 2016-02-10 | Discharge: 2016-02-12 | DRG: 378 | Disposition: A | Discharge status: Home / Self Care | Payer: Medicare HMO | Attending: Specialist | Admitting: Specialist

## 2016-02-10 ENCOUNTER — Encounter: Payer: Self-pay | Admitting: *Deleted

## 2016-02-10 DIAGNOSIS — Z885 Allergy status to narcotic agent status: Secondary | ICD-10-CM | POA: Diagnosis not present

## 2016-02-10 DIAGNOSIS — Z9889 Other specified postprocedural states: Secondary | ICD-10-CM | POA: Diagnosis not present

## 2016-02-10 DIAGNOSIS — I2510 Atherosclerotic heart disease of native coronary artery without angina pectoris: Secondary | ICD-10-CM | POA: Diagnosis present

## 2016-02-10 DIAGNOSIS — Z87891 Personal history of nicotine dependence: Secondary | ICD-10-CM | POA: Diagnosis not present

## 2016-02-10 DIAGNOSIS — Z86010 Personal history of colonic polyps: Secondary | ICD-10-CM

## 2016-02-10 DIAGNOSIS — Z8249 Family history of ischemic heart disease and other diseases of the circulatory system: Secondary | ICD-10-CM | POA: Diagnosis not present

## 2016-02-10 DIAGNOSIS — J9610 Chronic respiratory failure, unspecified whether with hypoxia or hypercapnia: Secondary | ICD-10-CM | POA: Diagnosis present

## 2016-02-10 DIAGNOSIS — Z888 Allergy status to other drugs, medicaments and biological substances status: Secondary | ICD-10-CM

## 2016-02-10 DIAGNOSIS — Z833 Family history of diabetes mellitus: Secondary | ICD-10-CM

## 2016-02-10 DIAGNOSIS — E785 Hyperlipidemia, unspecified: Secondary | ICD-10-CM | POA: Diagnosis present

## 2016-02-10 DIAGNOSIS — K921 Melena: Secondary | ICD-10-CM | POA: Diagnosis present

## 2016-02-10 DIAGNOSIS — Z794 Long term (current) use of insulin: Secondary | ICD-10-CM | POA: Diagnosis not present

## 2016-02-10 DIAGNOSIS — Z6839 Body mass index (BMI) 39.0-39.9, adult: Secondary | ICD-10-CM | POA: Diagnosis not present

## 2016-02-10 DIAGNOSIS — Z79899 Other long term (current) drug therapy: Secondary | ICD-10-CM

## 2016-02-10 DIAGNOSIS — E875 Hyperkalemia: Secondary | ICD-10-CM | POA: Diagnosis present

## 2016-02-10 DIAGNOSIS — Z825 Family history of asthma and other chronic lower respiratory diseases: Secondary | ICD-10-CM

## 2016-02-10 DIAGNOSIS — Z66 Do not resuscitate: Secondary | ICD-10-CM | POA: Diagnosis present

## 2016-02-10 DIAGNOSIS — D649 Anemia, unspecified: Secondary | ICD-10-CM | POA: Diagnosis present

## 2016-02-10 DIAGNOSIS — I252 Old myocardial infarction: Secondary | ICD-10-CM | POA: Diagnosis not present

## 2016-02-10 DIAGNOSIS — Q2733 Arteriovenous malformation of digestive system vessel: Secondary | ICD-10-CM | POA: Diagnosis not present

## 2016-02-10 DIAGNOSIS — I110 Hypertensive heart disease with heart failure: Secondary | ICD-10-CM | POA: Diagnosis present

## 2016-02-10 DIAGNOSIS — I739 Peripheral vascular disease, unspecified: Secondary | ICD-10-CM | POA: Diagnosis present

## 2016-02-10 DIAGNOSIS — Z8679 Personal history of other diseases of the circulatory system: Secondary | ICD-10-CM

## 2016-02-10 DIAGNOSIS — Z951 Presence of aortocoronary bypass graft: Secondary | ICD-10-CM

## 2016-02-10 DIAGNOSIS — E119 Type 2 diabetes mellitus without complications: Secondary | ICD-10-CM

## 2016-02-10 DIAGNOSIS — Z9049 Acquired absence of other specified parts of digestive tract: Secondary | ICD-10-CM

## 2016-02-10 DIAGNOSIS — J449 Chronic obstructive pulmonary disease, unspecified: Secondary | ICD-10-CM | POA: Diagnosis present

## 2016-02-10 DIAGNOSIS — I5032 Chronic diastolic (congestive) heart failure: Secondary | ICD-10-CM | POA: Diagnosis present

## 2016-02-10 DIAGNOSIS — G4700 Insomnia, unspecified: Secondary | ICD-10-CM | POA: Diagnosis present

## 2016-02-10 DIAGNOSIS — K922 Gastrointestinal hemorrhage, unspecified: Secondary | ICD-10-CM

## 2016-02-10 DIAGNOSIS — Z880 Allergy status to penicillin: Secondary | ICD-10-CM | POA: Diagnosis not present

## 2016-02-10 LAB — COMPREHENSIVE METABOLIC PANEL
ALBUMIN: 3.3 g/dL — AB (ref 3.5–5.0)
ALK PHOS: 87 U/L (ref 38–126)
ALT: 13 U/L — ABNORMAL LOW (ref 17–63)
AST: 15 U/L (ref 15–41)
Anion gap: 8 (ref 5–15)
BILIRUBIN TOTAL: 0.5 mg/dL (ref 0.3–1.2)
BUN: 33 mg/dL — AB (ref 6–20)
CALCIUM: 8.6 mg/dL — AB (ref 8.9–10.3)
CO2: 29 mmol/L (ref 22–32)
CREATININE: 1.29 mg/dL — AB (ref 0.61–1.24)
Chloride: 101 mmol/L (ref 101–111)
GFR calc Af Amer: >60 mL/min (ref >60)
GFR, EST NON AFRICAN AMERICAN: 53 mL/min — AB (ref >60)
GLUCOSE: 307 mg/dL — AB (ref 65–99)
POTASSIUM: 5.3 mmol/L — AB (ref 3.5–5.1)
Sodium: 138 mmol/L (ref 135–145)
TOTAL PROTEIN: 6.5 g/dL (ref 6.5–8.1)

## 2016-02-10 LAB — CBC
HEMATOCRIT: 26.5 % — AB (ref 40.0–52.0)
HEMOGLOBIN: 8.4 g/dL — AB (ref 13.0–18.0)
MCH: 28.7 pg (ref 26.0–34.0)
MCHC: 31.6 g/dL — AB (ref 32.0–36.0)
MCV: 90.9 fL (ref 80.0–100.0)
Platelets: 271 10*3/uL (ref 150–440)
RBC: 2.91 MIL/uL — ABNORMAL LOW (ref 4.40–5.90)
RDW: 15.9 % — AB (ref 11.5–14.5)
WBC: 11.2 10*3/uL — AB (ref 3.8–10.6)

## 2016-02-10 LAB — PREPARE RBC (CROSSMATCH)

## 2016-02-10 LAB — URINALYSIS COMPLETE WITH MICROSCOPIC (ARMC ONLY)
BACTERIA UA: NONE SEEN
Bilirubin Urine: NEGATIVE
Glucose, UA: 50 mg/dL — AB
HGB URINE DIPSTICK: NEGATIVE
LEUKOCYTES UA: NEGATIVE
NITRITE: NEGATIVE
PH: 7 (ref 5.0–8.0)
Protein, ur: NEGATIVE mg/dL
SQUAMOUS EPITHELIAL / LPF: NONE SEEN
Specific Gravity, Urine: 1.016 (ref 1.005–1.030)

## 2016-02-10 LAB — GLUCOSE, CAPILLARY
GLUCOSE-CAPILLARY: 225 mg/dL — AB (ref 65–99)
Glucose-Capillary: 240 mg/dL — ABNORMAL HIGH (ref 65–99)

## 2016-02-10 LAB — TROPONIN I

## 2016-02-10 LAB — LIPASE, BLOOD: Lipase: 36 U/L (ref 11–51)

## 2016-02-10 MED ORDER — ALBUTEROL SULFATE (2.5 MG/3ML) 0.083% IN NEBU: 2.5000 mg | INHALATION_SOLUTION | Freq: Four times a day (QID) | RESPIRATORY_TRACT | Status: DC | PRN

## 2016-02-10 MED ORDER — ACETAMINOPHEN 650 MG RE SUPP
650.0000 mg | Freq: Four times a day (QID) | RECTAL | Status: DC | PRN
Start: 2016-02-10 — End: 2016-02-12

## 2016-02-10 MED ORDER — TIOTROPIUM BROMIDE MONOHYDRATE 18 MCG IN CAPS
18.0000 ug | ORAL_CAPSULE | Freq: Every day | RESPIRATORY_TRACT | Status: DC
  Administered 2016-02-11 – 2016-02-12 (×2): 18 ug via RESPIRATORY_TRACT
  Filled 2016-02-10: qty 5

## 2016-02-10 MED ORDER — SODIUM CHLORIDE 0.9 % IV SOLN
80.0000 mg | Freq: Once | INTRAVENOUS | Status: AC
  Administered 2016-02-10: 80 mg via INTRAVENOUS
  Filled 2016-02-10: qty 80

## 2016-02-10 MED ORDER — MOMETASONE FURO-FORMOTEROL FUM 200-5 MCG/ACT IN AERO
2.0000 | INHALATION_SPRAY | Freq: Two times a day (BID) | RESPIRATORY_TRACT | Status: DC
  Administered 2016-02-10 – 2016-02-12 (×4): 2 via RESPIRATORY_TRACT
  Filled 2016-02-10: qty 8.8

## 2016-02-10 MED ORDER — VITAMIN D 1000 UNITS PO TABS
1000.0000 [IU] | ORAL_TABLET | Freq: Every day | ORAL | Status: DC
  Administered 2016-02-10 – 2016-02-12 (×3): 1000 [IU] via ORAL
  Filled 2016-02-10 (×3): qty 1

## 2016-02-10 MED ORDER — DIPHENHYDRAMINE HCL 25 MG PO CAPS
25.0000 mg | ORAL_CAPSULE | Freq: Once | ORAL | Status: AC
  Administered 2016-02-10: 25 mg via ORAL
  Filled 2016-02-10: qty 1

## 2016-02-10 MED ORDER — HYDROCODONE-ACETAMINOPHEN 5-325 MG PO TABS
1.0000 | ORAL_TABLET | Freq: Four times a day (QID) | ORAL | Status: DC | PRN
  Administered 2016-02-12 (×2): 1 via ORAL
  Filled 2016-02-10 (×3): qty 1

## 2016-02-10 MED ORDER — LIDOCAINE 5 % EX OINT: 1.0000 "application " | TOPICAL_OINTMENT | CUTANEOUS | Status: DC | PRN

## 2016-02-10 MED ORDER — GABAPENTIN 100 MG PO CAPS
100.0000 mg | ORAL_CAPSULE | Freq: Three times a day (TID) | ORAL | Status: DC
  Administered 2016-02-10 – 2016-02-12 (×5): 100 mg via ORAL
  Filled 2016-02-10 (×6): qty 1

## 2016-02-10 MED ORDER — SODIUM CHLORIDE 0.9 % IV SOLN
Freq: Once | INTRAVENOUS | Status: AC
  Administered 2016-02-10: 16:00:00 via INTRAVENOUS

## 2016-02-10 MED ORDER — SODIUM CHLORIDE 0.9 % IV BOLUS (SEPSIS)
1000.0000 mL | Freq: Once | INTRAVENOUS | Status: AC
  Administered 2016-02-10: 1000 mL via INTRAVENOUS

## 2016-02-10 MED ORDER — ONDANSETRON 8 MG PO TBDP
8.0000 mg | ORAL_TABLET | Freq: Once | ORAL | Status: AC
  Administered 2016-02-11: 8 mg via ORAL
  Filled 2016-02-10: qty 1

## 2016-02-10 MED ORDER — ACETAMINOPHEN 325 MG PO TABS
650.0000 mg | ORAL_TABLET | Freq: Once | ORAL | Status: AC
  Administered 2016-02-10: 650 mg via ORAL
  Filled 2016-02-10: qty 2

## 2016-02-10 MED ORDER — ALPRAZOLAM 0.5 MG PO TABS
0.5000 mg | ORAL_TABLET | Freq: Three times a day (TID) | ORAL | Status: DC | PRN
  Administered 2016-02-10 – 2016-02-12 (×3): 0.5 mg via ORAL
  Filled 2016-02-10 (×3): qty 1

## 2016-02-10 MED ORDER — LISINOPRIL 5 MG PO TABS
2.5000 mg | ORAL_TABLET | Freq: Every day | ORAL | Status: DC
  Administered 2016-02-10 – 2016-02-12 (×3): 2.5 mg via ORAL
  Filled 2016-02-10 (×4): qty 1

## 2016-02-10 MED ORDER — METOPROLOL TARTRATE 25 MG PO TABS
25.0000 mg | ORAL_TABLET | Freq: Two times a day (BID) | ORAL | Status: DC
  Administered 2016-02-10 – 2016-02-12 (×3): 25 mg via ORAL
  Filled 2016-02-10 (×4): qty 1

## 2016-02-10 MED ORDER — TRAMADOL-ACETAMINOPHEN 37.5-325 MG PO TABS
1.0000 | ORAL_TABLET | Freq: Two times a day (BID) | ORAL | Status: DC | PRN
  Administered 2016-02-10: 1 via ORAL
  Filled 2016-02-10: qty 1

## 2016-02-10 MED ORDER — SODIUM CHLORIDE 0.9 % IV SOLN
400.0000 mg | Freq: Once | INTRAVENOUS | Status: AC
  Administered 2016-02-10: 400 mg via INTRAVENOUS
  Filled 2016-02-10: qty 20

## 2016-02-10 MED ORDER — ATORVASTATIN CALCIUM 20 MG PO TABS
40.0000 mg | ORAL_TABLET | Freq: Every day | ORAL | Status: DC
Start: 2016-02-10 — End: 2016-02-12
  Administered 2016-02-10 – 2016-02-11 (×2): 40 mg via ORAL
  Filled 2016-02-10 (×2): qty 2

## 2016-02-10 MED ORDER — CITALOPRAM HYDROBROMIDE 20 MG PO TABS
40.0000 mg | ORAL_TABLET | Freq: Every day | ORAL | Status: DC
  Administered 2016-02-10 – 2016-02-12 (×3): 40 mg via ORAL
  Filled 2016-02-10 (×3): qty 2

## 2016-02-10 MED ORDER — TORSEMIDE 20 MG PO TABS
40.0000 mg | ORAL_TABLET | Freq: Every day | ORAL | Status: DC
  Administered 2016-02-10 – 2016-02-12 (×3): 40 mg via ORAL
  Filled 2016-02-10 (×3): qty 2

## 2016-02-10 MED ORDER — SODIUM CHLORIDE 0.9 % IV SOLN
8.0000 mg/h | INTRAVENOUS | Status: DC
  Administered 2016-02-10 – 2016-02-11 (×2): 8 mg/h via INTRAVENOUS
  Filled 2016-02-10 (×3): qty 80

## 2016-02-10 MED ORDER — ZOLPIDEM TARTRATE 5 MG PO TABS
5.0000 mg | ORAL_TABLET | Freq: Every evening | ORAL | Status: DC | PRN
  Administered 2016-02-10 – 2016-02-11 (×2): 5 mg via ORAL
  Filled 2016-02-10 (×2): qty 1

## 2016-02-10 MED ORDER — INSULIN ASPART 100 UNIT/ML ~~LOC~~ SOLN
0.0000 [IU] | Freq: Every day | SUBCUTANEOUS | Status: DC
  Administered 2016-02-10: 2 [IU] via SUBCUTANEOUS
  Filled 2016-02-10: qty 2

## 2016-02-10 MED ORDER — INSULIN GLARGINE 100 UNIT/ML ~~LOC~~ SOLN
20.0000 [IU] | Freq: Two times a day (BID) | SUBCUTANEOUS | Status: DC
  Administered 2016-02-10 – 2016-02-12 (×4): 20 [IU] via SUBCUTANEOUS
  Filled 2016-02-10 (×7): qty 0.2

## 2016-02-10 MED ORDER — SUCRALFATE 1 G PO TABS
1.0000 g | ORAL_TABLET | Freq: Three times a day (TID) | ORAL | Status: DC
  Administered 2016-02-10 – 2016-02-12 (×6): 1 g via ORAL
  Filled 2016-02-10 (×7): qty 1

## 2016-02-10 MED ORDER — UMECLIDINIUM BROMIDE 62.5 MCG/INH IN AEPB: 1.0000 | INHALATION_SPRAY | Freq: Every day | RESPIRATORY_TRACT | Status: DC

## 2016-02-10 MED ORDER — SODIUM CHLORIDE 0.9 % IV SOLN
INTRAVENOUS | Status: DC
  Administered 2016-02-10 – 2016-02-11 (×3): via INTRAVENOUS

## 2016-02-10 MED ORDER — METFORMIN HCL 500 MG PO TABS
500.0000 mg | ORAL_TABLET | Freq: Two times a day (BID) | ORAL | Status: DC
Start: 2016-02-10 — End: 2016-02-11
  Administered 2016-02-10 – 2016-02-11 (×2): 500 mg via ORAL
  Filled 2016-02-10 (×2): qty 1

## 2016-02-10 MED ORDER — INSULIN ASPART 100 UNIT/ML ~~LOC~~ SOLN
0.0000 [IU] | Freq: Three times a day (TID) | SUBCUTANEOUS | Status: DC
  Administered 2016-02-10: 3 [IU] via SUBCUTANEOUS
  Administered 2016-02-11 (×2): 2 [IU] via SUBCUTANEOUS
  Filled 2016-02-10 (×2): qty 2
  Filled 2016-02-10: qty 3

## 2016-02-10 MED ORDER — ACETAMINOPHEN 325 MG PO TABS: 650.0000 mg | ORAL_TABLET | Freq: Four times a day (QID) | ORAL | Status: DC | PRN
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
## 2016-02-10 NOTE — ED Notes (Signed)
One attempt made by a medic to obtain 2nd IV site which was unsuccessful.

## 2016-02-10 NOTE — ED Notes (Signed)
Pharmacy called for Protonix infusion and Lantus SQ injection and they will be sent to the floor.

## 2016-02-10 NOTE — ED Notes (Signed)
Two unsuccessful IV attempts via ultrasound by Noe Gens. RN.  Patient was very vocal and rude to staff during procedure. Dr. Kerman Passey is aware.

## 2016-02-10 NOTE — ED Notes (Signed)
Per EMS report, patient c/o LUQ abdominal pain that began at midnight last night. Patient also c/o black stools today also. Patient also c/o increased shortness of breath for the last two hours which is why he called the EMS. Patient is very verbal upon arrival, irritable and demanding. Patient has a BM upon arrival that was dark. Patient is normally on 5L O2 via .

## 2016-02-10 NOTE — ED Notes (Signed)
Patient refused Zofran at this time, states nausea has resolved.

## 2016-02-11 LAB — GLUCOSE, CAPILLARY
GLUCOSE-CAPILLARY: 242 mg/dL — AB (ref 65–99)
Glucose-Capillary: 176 mg/dL — ABNORMAL HIGH (ref 65–99)
Glucose-Capillary: 189 mg/dL — ABNORMAL HIGH (ref 65–99)
Glucose-Capillary: 188 mg/dL — ABNORMAL HIGH (ref 65–99)

## 2016-02-11 LAB — BASIC METABOLIC PANEL
Anion gap: 10 (ref 5–15)
BUN: 32 mg/dL — AB (ref 6–20)
CO2: 26 mmol/L (ref 22–32)
Calcium: 8.3 mg/dL — ABNORMAL LOW (ref 8.9–10.3)
Chloride: 106 mmol/L (ref 101–111)
Creatinine, Ser: 1.31 mg/dL — ABNORMAL HIGH (ref 0.61–1.24)
GFR calc Af Amer: >60 mL/min (ref >60)
GFR, EST NON AFRICAN AMERICAN: 52 mL/min — AB (ref >60)
GLUCOSE: 137 mg/dL — AB (ref 65–99)
Potassium: 4.1 mmol/L (ref 3.5–5.1)
Sodium: 142 mmol/L (ref 135–145)

## 2016-02-11 LAB — CBC
HEMATOCRIT: 27.4 % — AB (ref 40.0–52.0)
Hemoglobin: 9 g/dL — ABNORMAL LOW (ref 13.0–18.0)
MCH: 30.1 pg (ref 26.0–34.0)
MCHC: 32.8 g/dL (ref 32.0–36.0)
MCV: 91.9 fL (ref 80.0–100.0)
Platelets: 232 10*3/uL (ref 150–440)
RBC: 2.98 MIL/uL — ABNORMAL LOW (ref 4.40–5.90)
RDW: 16.1 % — AB (ref 11.5–14.5)
WBC: 7.9 10*3/uL (ref 3.8–10.6)

## 2016-02-11 MED ORDER — INSULIN ASPART 100 UNIT/ML ~~LOC~~ SOLN
0.0000 [IU] | Freq: Every day | SUBCUTANEOUS | Status: DC
  Administered 2016-02-11: 2 [IU] via SUBCUTANEOUS
  Filled 2016-02-11: qty 2

## 2016-02-11 MED ORDER — PANTOPRAZOLE SODIUM 40 MG IV SOLR
40.0000 mg | Freq: Two times a day (BID) | INTRAVENOUS | Status: DC
  Administered 2016-02-11 – 2016-02-12 (×3): 40 mg via INTRAVENOUS
  Filled 2016-02-11 (×3): qty 40

## 2016-02-11 MED ORDER — INSULIN ASPART 100 UNIT/ML ~~LOC~~ SOLN
0.0000 [IU] | Freq: Three times a day (TID) | SUBCUTANEOUS | Status: DC
  Administered 2016-02-11 – 2016-02-12 (×2): 3 [IU] via SUBCUTANEOUS
  Administered 2016-02-12: 5 [IU] via SUBCUTANEOUS
  Filled 2016-02-11: qty 3
  Filled 2016-02-11: qty 5
  Filled 2016-02-11: qty 3
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
## 2016-02-11 NOTE — Progress Notes (Addendum)
[place] [facility] [place] NAME: [name]    MR#:  [id number]  DATE OF BIRTH:  03/06/1941  SUBJECTIVE:   Patient here due to shortness of breath and also having melanotic stools with symptomatic anemia. Shortness of breath much improved, no further melanotic stools today. Patient is on chronic iron.  REVIEW OF SYSTEMS:    Review of Systems  Constitutional: Negative for fever and chills.  HENT: Negative for congestion and tinnitus.   Eyes: Negative for blurred vision and double vision.  Respiratory: Positive for shortness of breath (chronic). Negative for cough and wheezing.   Cardiovascular: Negative for chest pain, orthopnea and PND.  Gastrointestinal: Positive for melena. Negative for nausea, vomiting, abdominal pain and diarrhea.  Genitourinary: Negative for dysuria and hematuria.  Neurological: Negative for dizziness, sensory change and focal weakness.  All other systems reviewed and are negative.   Nutrition: Clear liquid Tolerating Diet: Yes Tolerating PT: Ambulatory   DRUG ALLERGIES:   Allergies  Allergen Reactions  . Morphine And Related Shortness Of Breath, Nausea And Vomiting, Rash and Other (See Comments)    Reaction:  Hallucinations   . Penicillins Anaphylaxis, Hives and Other (See Comments)    Has patient had a PCN reaction causing immediate rash, facial/tongue/throat swelling, SOB or lightheadedness with hypotension: Yes Has patient had a PCN reaction causing severe rash involving mucus membranes or skin necrosis: No Has patient had a PCN reaction that required hospitalization No Has patient had a PCN reaction occurring within the last 10 years: No If all of the above answers are "NO", then may proceed with Cephalosporin use.  . Demerol [Meperidine] Other (See Comments)    Reaction:  Hallucinations    . Dilaudid [Hydromorphone Hcl] Other (See Comments)    Reaction:  Hallucinations   . Levofloxacin Other (See  Comments)    Reaction:  Unknown     VITALS:  Blood pressure 101/70, pulse 88, temperature 98.2 F (36.8 C), temperature source Oral, resp. rate 15, height 5\' 8"  (1.727 m), weight 117.482 kg (259 lb), SpO2 97 %.  PHYSICAL EXAMINATION:   Physical Exam  GENERAL:  75 y.o.-year-old obese patient lying in the bed with no acute distress.  EYES: Pupils equal, round, reactive to light and accommodation. No scleral icterus. Extraocular muscles intact.  HEENT: Head atraumatic, normocephalic. Oropharynx and nasopharynx clear.  NECK:  Supple, no jugular venous distention. No thyroid enlargement, no tenderness.  LUNGS: Prolonged inspiratory and expiratory phase, no wheezing, rales, rhonchi. No use of accessory muscles of respiration.  CARDIOVASCULAR: S1, S2 normal. No murmurs, rubs, or gallops.  ABDOMEN: Soft, nontender, nondistended. Bowel sounds present. No organomegaly or mass.  EXTREMITIES: No cyanosis, clubbing or edema b/l.    NEUROLOGIC: Cranial nerves II through XII are intact. No focal Motor or sensory deficits b/l.   PSYCHIATRIC: The patient is alert and oriented x 3.  SKIN: No obvious rash, lesion, or ulcer.    LABORATORY PANEL:   CBC  Recent Labs Lab 02/11/16 0514  WBC 7.9  HGB 9.0*  HCT 27.4*  PLT 232   ------------------------------------------------------------------------------------------------------------------  Chemistries   Recent Labs Lab 02/10/16 1303 02/11/16 0358  NA 138 142  K 5.3* 4.1  CL 101 106  CO2 29 26  GLUCOSE 307* 137*  BUN 33* 32*  CREATININE 1.29* 1.31*  CALCIUM 8.6* 8.3*  AST 15  --   ALT 13*  --   ALKPHOS 87  --   BILITOT 0.5  --    ------------------------------------------------------------------------------------------------------------------  Cardiac Enzymes  Recent Labs Lab 02/10/16 1303  TROPONINI <0.03    ------------------------------------------------------------------------------------------------------------------  RADIOLOGY:  Dg Chest Portable 1 View  02/10/2016  CLINICAL DATA:  Shortness of breath. Left upper quadrant abdominal pain. EXAM: PORTABLE CHEST 1 VIEW COMPARISON:  10/30/2015 and 10/25/2015 and CT scan of the chest dated 07/01/2015 FINDINGS: Heart size and pulmonary vascularity are normal. No infiltrates or effusions. Hyperlucency in the upper lobes consistent with emphysema. Prominent bleb in the left lung apex, unchanged. Flattening of the diaphragm also consistent with emphysema. Large chronic hiatal hernia. IMPRESSION: Emphysema.  No acute abnormality. Electronically Signed   By: Lorriane Shire M.D.   On: 02/10/2016 12:34     ASSESSMENT AND PLAN:   75 year old male with past medical history of coronary artery disease, COPD, history of aortic aneurysm, chronic anemia, chronic diastolic CHF, GI bleed with AV malformations, gastric polyps, diverticulosis, anxiety/depression, bipolar disorder, morbid obesity who presented to the hospital with shortness of breath and also dark stools.  1. Acute on chronic upper GI bleed-this is the cause of patient's melanotic stools. Patient has had a history of AV malformations and gastric polyps. -Given his chronic respiratory failure he is at high risk for endoscopy. He has been transfused 1 unit and hemoglobin has improved. He accutely has no bleeding presently. -Await further gastroenterology input DC Protonix drip, continue Protonix twice daily. -Continue clear liquid diet for now.  2. Symptomatic anemia-she has been transfused 1 unit of packed red blood cells and hemoglobin is improved. -Follow serial hemoglobin. Given 1 dose of IV Iron. Continue iron supplements.  3. Diabetes type 2 without complication-continue Lantus, NovoLog sliding scale. Blood sugar stable.  4. COPD-patient has end-stage COPD and is on 4-5 L of oxygen  chronically. -No acute exacerbation. Continue Dulera, Spiriva  5. Anxiety/depression-continue Xanax, Celexa.  6. Hyperlipidemia-continue atorvastatin.  7. Diabetic neuropathy-continue gabapentin.  8. History of diastolic CHF-clinically patient is not in congestive heart failure. -Continue Demadex, metoprolol, lisinopril.   All the records are reviewed and case discussed with Care Management/Social Workerr. Management plans discussed with the patient, family and they are in agreement.  CODE STATUS: DNR  DVT Prophylaxis: Teds and SCDs  TOTAL TIME TAKING CARE OF THIS PATIENT: 30 minutes.   POSSIBLE D/C IN 1-2 DAYS, DEPENDING ON CLINICAL CONDITION.   Henreitta Leber M.D on 02/11/2016 at 1:55 PM  Between 7am to 6pm - Pager - [phone]  After 6pm go to [email or web address] - password [email or web address]  Office  [phone]  CC: Primary care physician; [name], MD

## 2016-02-11 NOTE — Consult Note (Signed)
Pt well known to me, previous EGD in 07/2015.  He has recurrent anemia, iron def, likely from chronic slow bleeding.  He is on 5 L of nasal cannula oxygen and this makes him a very high risk for any sedated procedure and I would not do one unless it was a acutely life threatening situation such as active severe bleeding.  Agree with iron and parenteral iron to keep  His blood count up.  He has follow up appt with Dr. Allen Norris in June.  I have started full liquid diet and would slowly advance this as out patient.

## 2016-02-11 NOTE — Progress Notes (Signed)
Inpatient Diabetes Program Recommendations  AACE/ADA: New Consensus Statement on Inpatient Glycemic Control (2015)  Target Ranges:  Prepandial:   less than 140 mg/dL      Peak postprandial:   less than 180 mg/dL (1-2 hours)      Critically ill patients:  140 - 180 mg/dL   Review of Glycemic Control  Results for NICHOLES, HUML (MRN [id number]) as of 02/11/2016 10:54  Ref. Range 11/01/2015 07:37 11/01/2015 11:36 02/10/2016 17:19 02/10/2016 21:21 02/11/2016 07:59  Glucose-Capillary Latest Ref Range: 65-99 mg/dL 226 (H) 368 (H) 225 (H) 240 (H) 176 (H)    Diabetes history: Type 2 Outpatient Diabetes medications: Lantus 25 units bid, Novolog 10 units tid  Current orders for Inpatient glycemic control: Novolog 0-5 units qhs, Novolog 0-9 units tid, Metformin 500mg  bid, Lantus 20 units bid  Inpatient Diabetes Program Recommendations:    Consider increasing Novolog correction to 0-15 units tid.  Do you want to keep him on Metformin while he's experiencing GI distress?  Gentry Fitz, RN, BA, MHA, CDE Diabetes Coordinator Inpatient Diabetes Program  [phone] (Team Pager) [phone] ([place]) 02/11/2016 10:59 AM

## 2016-02-12 LAB — CBC
HEMATOCRIT: 23.7 % — AB (ref 40.0–52.0)
HEMOGLOBIN: 7.8 g/dL — AB (ref 13.0–18.0)
MCH: 29.9 pg (ref 26.0–34.0)
MCHC: 32.8 g/dL (ref 32.0–36.0)
MCV: 91.3 fL (ref 80.0–100.0)
Platelets: 207 10*3/uL (ref 150–440)
RBC: 2.6 MIL/uL — AB (ref 4.40–5.90)
RDW: 15.9 % — ABNORMAL HIGH (ref 11.5–14.5)
WBC: 7.8 10*3/uL (ref 3.8–10.6)

## 2016-02-12 LAB — GLUCOSE, CAPILLARY
GLUCOSE-CAPILLARY: 184 mg/dL — AB (ref 65–99)
GLUCOSE-CAPILLARY: 212 mg/dL — AB (ref 65–99)

## 2016-02-12 LAB — HEMOGLOBIN: Hemoglobin: 9.2 g/dL — ABNORMAL LOW (ref 13.0–18.0)

## 2016-02-12 LAB — PREPARE RBC (CROSSMATCH)

## 2016-02-12 MED ORDER — SODIUM CHLORIDE 0.9 % IV SOLN
Freq: Once | INTRAVENOUS | Status: AC
  Administered 2016-02-12: 10:00:00 via INTRAVENOUS

## 2016-02-12 NOTE — Progress Notes (Signed)
Inpatient Diabetes Program Recommendations  AACE/ADA: New Consensus Statement on Inpatient Glycemic Control (2015)  Target Ranges:  Prepandial:   less than 140 mg/dL      Peak postprandial:   less than 180 mg/dL (1-2 hours)      Critically ill patients:  140 - 180 mg/dL  Results for GARV, MARITATO (MRN [id number]) as of 02/12/2016 07:50  Ref. Range 02/11/2016 07:59 02/11/2016 11:55 02/11/2016 17:32 02/11/2016 20:55  Glucose-Capillary Latest Ref Range: 65-99 mg/dL 176 (H) 189 (H) 188 (H) 242 (H)   Review of Glycemic Control  Diabetes history: DM2 Outpatient Diabetes medications: Lantus 20 units BID, Novolog 10 units TID with meals Current orders for Inpatient glycemic control: Lantus 20 units BID, Novolog 0-15 units TID with meals, Novolog 0-5 units QHS  Inpatient Diabetes Program Recommendations: Insulin - Meal Coverage: When diet is advanced, please consider ordering Novolog 3 units TID with meals for meal coverage (in addition to Novolog correction scale).  Thanks, Barnie Alderman, RN, MSN, CDE Diabetes Coordinator Inpatient Diabetes Program [phone] (Team Pager from [place] to [place]) [phone] ([place] office) [phone] [facility] office) [phone] [facility] office)

## 2016-02-12 NOTE — Care Management Important Message (Signed)
Important Message  Patient Details  Name: [name] MRN: [id number] Date of Birth: [birth year]   Medicare Important Message Given:  Yes reviewed with patient.  Patient balled up IM form and threw the form away.     Beverly Sessions, RN 02/12/2016, 11:21 AM

## 2016-02-12 NOTE — Care Management (Signed)
Patient admitted for GI bleed.  Patient lives at home with his wife.  Patient has chronic O2 with [facility], wife to bring portable O2 tank at time of discharge.  Patient is open with [facility] home health for RN, I have notified [name] with [facility] of admission. I discussed with patient to option to add additional services in the home patient states "i only want nurse Rose coming out, all of those other people are idiots".  Patient expressed concerns of being discharged today.  MD aware.  I have reviewed Medicare IM with patient and appeal process.  Department Director Suezanne Jacquet at bedside for this discussion.  Patient crumpled the IM paper and tossed it to the side.  Patient will need resumption of [facility] RN order at time of discharge.

## 2016-02-12 NOTE — Discharge Summary (Signed)
[facility] NAME: Christifer Etherton    MR#:  [id number]  DATE OF BIRTH:  1941-03-12  DATE OF ADMISSION:  02/10/2016 ADMITTING PHYSICIAN: Loletha Grayer, MD  DATE OF DISCHARGE: 02/12/2016  PRIMARY CARE PHYSICIAN: [name], MD    ADMISSION DIAGNOSIS:  Anemia, unspecified anemia type [D64.9] Gastrointestinal hemorrhage, unspecified gastritis, unspecified gastrointestinal hemorrhage type [K92.2]  DISCHARGE DIAGNOSIS:  Active Problems:   Upper GI bleed   SECONDARY DIAGNOSIS:   Past Medical History  Diagnosis Date  . Allergic rhinitis, cause unspecified   . Hyperlipidemia   . CAD (coronary artery disease)     a. 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  . Essential hypertension   . COPD (chronic obstructive pulmonary disease) ([place])     a. GOLD stage IV, started home O2. Severe bullous disease of LUL. Prolonged intubation after surgeries due to COPD.  Marland Kitchen AAA (abdominal aortic aneurysm) ([place])     a. 12/2008 s/p 7cm, endovascular repair with coiling right hypogastric artery   . Recurrent Microcytic Anemia     a. presumed chronic GI blood loss.  . Memory loss   . Type II diabetes mellitus ([place])   . Chronic diastolic CHF (congestive heart failure) ([place])     a. 04/2015 Echo: EF 55-60%, no rwma, Gr 1 DD, mild AI.  Marland Kitchen GI bleed requiring more than 4 units of blood in 24 hours, ICU, or surgery     a. Hx bleeding gastric polyps, cecal & sigmoid AVMS s/p APC 03/30/14  . AVM (arteriovenous malformation) of colon with hemorrhage   . Multiple gastric polyps   . Diverticulosis   . Essential hypertension 08/18/2009    Qualifier: Diagnosis of  By: Doy Mince LPN, Megan    . Anxiety state 09/10/2013  . Depression 01/14/2013  . Vitamin D deficiency 08/10/2014  . Leucocytosis 12/04/2013  . Insomnia 08/10/2014  . Bipolar 1 disorder, mixed, moderate ([place]) 04/16/2015  . Morbid obesity [facility])     HOSPITAL COURSE:    75 year old male with past medical history of coronary artery disease, COPD, history of aortic aneurysm, chronic anemia, chronic diastolic CHF, GI bleed with AV malformations, gastric polyps, diverticulosis, anxiety/depression, bipolar disorder, morbid obesity who presented to the hospital with shortness of breath and also dark stools.  1. Acute on chronic upper GI bleed-this was the cause of patient's melanotic stools. Patient has had a history of AV malformations and gastric polyps. -Given his chronic respiratory failure he is at high risk for endoscopy. He has been transfused 2 units and hemoglobin has improved and currently 9.2 upon discharge. He accutely has no bleeding presently. -Patient was seen by gastroenterology and the plan for no acute intervention. They recommend continuing PPI, Carafate as outpatient.  2. Symptomatic anemia-he has been transfused 2 units of packed red blood cells and hemoglobin has improved and is currently stable.  -Patient will continue his iron supplements as an outpatient. He was given 1 dose of IV iron while in the hospital.  3. Diabetes type 2 without complication- he will continue Lantus, NovoLog  With meals as outpatient.   4. COPD-patient has end-stage COPD and is on 4-5 L of oxygen chronically. -he had No acute exacerbation. He will continue his inhalers as mentioned below.   5. Anxiety/depression- he will continue Xanax, Celexa.  6. Hyperlipidemia- he will continue atorvastatin.  7. Diabetic neuropathy- he will continue gabapentin.  8. History of diastolic CHF-clinically patient was  not in congestive heart failure while in the hospital.  - he will Continue Demadex, metoprolol, lisinopril.  DISCHARGE CONDITIONS:   Stable  CONSULTS OBTAINED:  Treatment Team:  Manya Silvas, MD  DRUG ALLERGIES:   Allergies  Allergen Reactions  . Morphine And Related Shortness Of Breath, Nausea And Vomiting, Rash and Other (See Comments)    Reaction:   Hallucinations   . Penicillins Anaphylaxis, Hives and Other (See Comments)    Has patient had a PCN reaction causing immediate rash, facial/tongue/throat swelling, SOB or lightheadedness with hypotension: Yes Has patient had a PCN reaction causing severe rash involving mucus membranes or skin necrosis: No Has patient had a PCN reaction that required hospitalization No Has patient had a PCN reaction occurring within the last 10 years: No If all of the above answers are "NO", then may proceed with Cephalosporin use.  . Demerol [Meperidine] Other (See Comments)    Reaction:  Hallucinations    . Dilaudid [Hydromorphone Hcl] Other (See Comments)    Reaction:  Hallucinations   . Levofloxacin Other (See Comments)    Reaction:  Unknown     DISCHARGE MEDICATIONS:   Current Discharge Medication List    CONTINUE these medications which have NOT CHANGED   Details  acetaminophen (TYLENOL) 325 MG tablet Take 650 mg by mouth every 6 (six) hours as needed for mild pain, fever or headache.     albuterol (PROVENTIL HFA;VENTOLIN HFA) 108 (90 BASE) MCG/ACT inhaler Inhale 2 puffs into the lungs every 6 (six) hours as needed for wheezing or shortness of breath. Qty: 1 Inhaler, Refills: 2    ALPRAZolam (XANAX) 0.25 MG tablet Take 0.5 mg by mouth 3 (three) times daily as needed for anxiety or sleep.    atorvastatin (LIPITOR) 40 MG tablet Take 40 mg by mouth at bedtime.    budesonide-formoterol (SYMBICORT) 160-4.5 MCG/ACT inhaler Inhale 2 puffs into the lungs 2 (two) times daily. Qty: 1 Inhaler, Refills: 2    cholecalciferol (VITAMIN D) 1000 UNITS tablet Take 1,000 Units by mouth daily.    citalopram (CELEXA) 40 MG tablet Take 40 mg by mouth daily.    ferrous sulfate 325 (65 FE) MG tablet Take 325 mg by mouth 3 (three) times daily with meals.     gabapentin (NEURONTIN) 100 MG capsule Take 1 capsule (100 mg total) by mouth 3 (three) times daily. Qty: 90 capsule, Refills: 3   Associated Diagnoses:  Diabetic polyneuropathy associated with type 2 diabetes mellitus (HCC)    HYDROcodone-acetaminophen (NORCO/VICODIN) 5-325 MG tablet Take 1 tablet by mouth every 6 (six) hours as needed for moderate pain. Qty: 30 tablet, Refills: 0    insulin aspart (NOVOLOG) 100 UNIT/ML injection Inject 10 Units into the skin 3 (three) times daily with meals. Qty: 10 mL, Refills: 11    insulin glargine (LANTUS) 100 UNIT/ML injection Inject 20 Units into the skin 2 (two) times daily.    lidocaine (XYLOCAINE) 5 % ointment Apply 1 application topically as needed for mild pain.    lisinopril (PRINIVIL,ZESTRIL) 2.5 MG tablet Take 2.5 mg by mouth daily.    metFORMIN (GLUCOPHAGE) 500 MG tablet Take 500 mg by mouth 2 (two) times daily with a meal.    metoprolol tartrate (LOPRESSOR) 25 MG tablet Take 25 mg by mouth 2 (two) times daily.    pantoprazole (PROTONIX) 40 MG tablet Take 40 mg by mouth daily.    sucralfate (CARAFATE) 1 G tablet Take 1 g by mouth 4 (four) times daily -  with meals and at bedtime.    tiotropium (SPIRIVA) 18 MCG inhalation capsule Place 18 mcg into inhaler and inhale daily.    torsemide (DEMADEX) 20 MG tablet Take 40 mg by mouth daily.    traMADol-acetaminophen (ULTRACET) 37.5-325 MG tablet Take 1 tablet by mouth 2 (two) times daily as needed for moderate pain.    Umeclidinium Bromide (INCRUSE ELLIPTA) 62.5 MCG/INH AEPB Inhale 1 puff into the lungs daily. Qty: 1 each, Refills: 0    zolpidem (AMBIEN) 5 MG tablet Take 1 tablet (5 mg total) by mouth at bedtime as needed for sleep. Qty: 30 tablet, Refills: 0         DISCHARGE INSTRUCTIONS:   DIET:  Cardiac diet and Diabetic diet  DISCHARGE CONDITION:  Stable  ACTIVITY:  Activity as tolerated  OXYGEN:  Home Oxygen: Yes.     Oxygen Delivery: 4 liters/min via Patient connected to nasal cannula oxygen  DISCHARGE LOCATION:  home   If you experience worsening of your admission symptoms, develop shortness of breath, life  threatening emergency, suicidal or homicidal thoughts you must seek medical attention immediately by calling 911 or calling your MD immediately  if symptoms less severe.  You Must read complete instructions/literature along with all the possible adverse reactions/side effects for all the Medicines you take and that have been prescribed to you. Take any new Medicines after you have completely understood and accpet all the possible adverse reactions/side effects.   Please note  You were cared for by a hospitalist during your hospital stay. If you have any questions about your discharge medications or the care you received while you were in the hospital after you are discharged, you can call the unit and asked to speak with the hospitalist on call if the hospitalist that took care of you is not available. Once you are discharged, your primary care physician will handle any further medical issues. Please note that NO REFILLS for any discharge medications will be authorized once you are discharged, as it is imperative that you return to your primary care physician (or establish a relationship with a primary care physician if you do not have one) for your aftercare needs so that they can reassess your need for medications and monitor your lab values.     Today   Complaining of some weakness. No chest pain. Chronic shortness of breath. No abdominal pain, nausea, vomiting. Has 1 melanotic stool since yesterday.  VITAL SIGNS:  Blood pressure 109/55, pulse 77, temperature 97.7 F (36.5 C), temperature source Oral, resp. rate 16, height 5\' 8"  (1.727 m), weight 117.482 kg (259 lb), SpO2 99 %.  I/O:   Intake/Output Summary (Last 24 hours) at 02/12/16 1626 Last data filed at 02/12/16 1340  Gross per 24 hour  Intake   2200 ml  Output    850 ml  Net   1350 ml    PHYSICAL EXAMINATION:  GENERAL:  75 y.o.-year-old obese patient lying in the bed with no acute distress.  EYES: Pupils equal, round, reactive  to light and accommodation. No scleral icterus. Extraocular muscles intact.  HEENT: Head atraumatic, normocephalic. Oropharynx and nasopharynx clear.  NECK:  Supple, no jugular venous distention. No thyroid enlargement, no tenderness.  LUNGS: Prolonged inspiratory and expiratory phase, no wheezing, rales,rhonchi. No use of accessory muscles of respiration.  CARDIOVASCULAR: S1, S2 normal. No murmurs, rubs, or gallops.  ABDOMEN: Soft, non-tender, non-distended. Bowel sounds present. No organomegaly or mass.  EXTREMITIES: No pedal edema, cyanosis, or clubbing.  NEUROLOGIC:  Cranial nerves II through XII are intact. No focal motor or sensory defecits b/l.  PSYCHIATRIC: The patient is alert and oriented x 3. Good affect.  SKIN: No obvious rash, lesion, or ulcer.   DATA REVIEW:   CBC  Recent Labs Lab 02/12/16 0439 02/12/16 1446  WBC 7.8  --   HGB 7.8* 9.2*  HCT 23.7*  --   PLT 207  --     Chemistries   Recent Labs Lab 02/10/16 1303 02/11/16 0358  NA 138 142  K 5.3* 4.1  CL 101 106  CO2 29 26  GLUCOSE 307* 137*  BUN 33* 32*  CREATININE 1.29* 1.31*  CALCIUM 8.6* 8.3*  AST 15  --   ALT 13*  --   ALKPHOS 87  --   BILITOT 0.5  --     Cardiac Enzymes  Recent Labs Lab 02/10/16 1303  TROPONINI <0.03    Microbiology Results  Results for orders placed or performed during the hospital encounter of 05/06/15  MRSA PCR Screening     Status: None   Collection Time: 05/06/15  7:29 PM  Result Value Ref Range Status   MRSA by PCR NEGATIVE NEGATIVE Final    Comment:        The GeneXpert MRSA Assay (FDA approved for NASAL specimens only), is one component of a comprehensive MRSA colonization surveillance program. It is not intended to diagnose MRSA infection nor to guide or monitor treatment for MRSA infections.     RADIOLOGY:  No results found.    Management plans discussed with the patient, family and they are in agreement.  CODE STATUS:     Code Status Orders         Start     Ordered   02/10/16 1528  Do not attempt resuscitation (DNR)   Continuous    Question Answer Comment  In the event of cardiac or respiratory ARREST Do not call a "code blue"   In the event of cardiac or respiratory ARREST Do not perform Intubation, CPR, defibrillation or ACLS   In the event of cardiac or respiratory ARREST Use medication by any route, position, wound care, and other measures to relive pain and suffering. May use oxygen, suction and manual treatment of airway obstruction as needed for comfort.   Comments nurse may pronounce      02/10/16 1528    Code Status History    Date Active Date Inactive Code Status Order ID Comments User Context   10/26/2015  3:14 AM 11/01/2015  8:05 PM Full Code [id number]  Saundra Shelling, MD Inpatient   08/20/2015  4:41 AM 08/22/2015  4:49 PM Full Code [id number]  Lytle Butte, MD ED   08/20/2015  2:54 AM 08/20/2015  4:41 AM Full Code [id number]  Lytle Butte, MD ED   07/27/2015  8:40 PM 07/29/2015  6:29 PM Full Code [id number]  Lytle Butte, MD ED   06/30/2015 12:40 PM 07/03/2015  3:26 PM Full Code [id number]  Bettey Costa, MD Inpatient   05/19/2015  2:59 PM 05/23/2015  4:21 PM Full Code [id number]  Max Sane, MD Inpatient   05/06/2015  5:14 PM 05/13/2015  5:06 PM Full Code [id number]  Epifanio Lesches, MD ED   05/03/2015 12:29 PM 05/04/2015  7:20 PM Full Code [id number]  Idelle Crouch, MD Inpatient   04/22/2015  4:50 AM 04/27/2015  7:35 PM Full Code [id number]  Harrie Foreman, MD Inpatient   04/17/2015  7:16 PM 04/19/2015  5:16 PM  Full Code [id number]  Clovis Fredrickson, MD Inpatient   04/10/2015  9:12 AM 04/17/2015  7:08 PM Full Code [id number]  Demetrios Loll, MD Inpatient   03/09/2015 12:18 PM 03/12/2015  6:45 PM Full Code [id number]  Henreitta Leber, MD Inpatient   08/24/2014  9:41 PM 08/25/2014  5:20 PM Full Code [id number]  Ivor Costa, MD ED   04/02/2014  8:11 PM 04/04/2014  5:10 PM Full Code [id number]  Velvet Bathe, MD Inpatient   03/18/2014  3:44 PM 03/22/2014   2:06 PM Full Code [id number]  Verlee Monte, MD Inpatient   12/04/2013  6:13 AM 12/10/2013  7:54 PM Full Code [id number]  Rise Patience, MD Inpatient   10/15/2013  8:11 PM 10/18/2013  6:40 PM Full Code [id number]  Fuller Plan, MD Inpatient   09/08/2013 12:43 AM 09/10/2013  3:30 PM Full Code [id number]  Toy Baker, MD Inpatient   08/19/2013  5:48 PM 08/22/2013  3:51 PM Full Code [id number]  Orson Eva, MD ED   05/15/2013  1:26 AM 05/17/2013  4:59 PM Full Code [id number]  Theodis Blaze, MD Inpatient   12/04/2012  5:23 PM 12/08/2012  4:56 PM Full Code [id number]  Janece Canterbury, MD Inpatient   05/17/2012 12:56 PM 05/21/2012  6:09 PM Full Code [id number]  Campbell Lerner, RN ED   04/06/2012  7:35 PM 04/14/2012  5:23 PM Full Code [id number]  Marylou Mccoy, RN Inpatient   03/27/2012  4:44 AM 03/28/2012  5:56 PM Full Code [id number]  Theotis Barrio, RN Inpatient      TOTAL TIME TAKING CARE OF THIS PATIENT: 40 minutes.    Henreitta Leber M.D on 02/12/2016 at 4:26 PM  Between 7am to 6pm - Pager - [phone]  After 6pm go to [email or web address] - password [email or web address] [facility]  Office  [phone]  CC: Primary care physician; [name], MD

## 2016-02-12 NOTE — Progress Notes (Signed)
Patient discharged home as ordered. Patient given follow up appointment with Dr. Lesle Chris as ordered. IV discontinued to right hand site clean dry and intact. Patient called his wife to go home

## 2016-02-12 NOTE — Care Management Note (Signed)
Case Management Note  Patient Details  Name: [name] MRN: [id number] Date of Birth: 1941-10-17  Subjective/Objective:      A referral for home health RN and PT was faxed and called to [facility]. Mr [name] is scheduled for discharge home later today.               Action/Plan:   Expected Discharge Date:                  Expected Discharge Plan:     In-House Referral:     Discharge planning Services     Post Acute Care Choice:    Choice offered to:     DME Arranged:    DME Agency:     HH Arranged:    [place] Agency:     Status of Service:     Medicare Important Message Given:  Yes Date Medicare IM Given:    Medicare IM give by:    Date Additional Medicare IM Given:    Additional Medicare Important Message give by:     If discussed at [place] of Stay Meetings, dates discussed:    Additional Comments:  Rechy Bost A, RN 02/12/2016, 3:23 PM

## 2016-02-13 LAB — TYPE AND SCREEN
ABO/RH(D): B NEG
ANTIBODY SCREEN: NEGATIVE
UNIT DIVISION: 0
UNIT DIVISION: 0

## 2016-03-07 ENCOUNTER — Inpatient Hospital Stay: Payer: Medicare HMO | Admitting: Oncology

## 2016-03-09 ENCOUNTER — Telehealth: Payer: Self-pay | Admitting: Pulmonary Disease

## 2016-03-09 NOTE — Telephone Encounter (Signed)
Patient's number is [phone] (he gave me a different number after I routed the msg).  He is also stating PCP will not give him Xanax any longer and is wanting to see if we will fill this for him.

## 2016-03-09 NOTE — Telephone Encounter (Signed)
atc pt X2, line rang 1 time then went to dead silence.  Wcb.

## 2016-03-10 NOTE — Telephone Encounter (Signed)
Attempted to contact pt. Line is busy. Will try back.

## 2016-03-11 MED ORDER — BUDESONIDE-FORMOTEROL FUMARATE 160-4.5 MCG/ACT IN AERO: 2.0000 | INHALATION_SPRAY | Freq: Two times a day (BID) | RESPIRATORY_TRACT | Status: DC

## 2016-03-11 NOTE — Telephone Encounter (Signed)
Pt scheduled to see BQ on Tuesday. Pt states he has enough Xanax to last him until then. Nothing further needed.

## 2016-03-11 NOTE — Telephone Encounter (Signed)
OK to refill Xanax 0.5mg  po daily prn anxiety, dispense #30 refill 0 This should be enough to help him find another PCP who will fill this medicine for him Let him know that we can't keep refilling it however.

## 2016-03-11 NOTE — Telephone Encounter (Signed)
Spoke with pt. He is needing a refill sent in for Symbicort. This has been taken care of. He is also wanting to know if BQ will refill Xanax for him, his PCP will no longer refill. Pt is requesting to speak with BQ.  BQ - please advise. Thanks.

## 2016-03-11 NOTE — Telephone Encounter (Signed)
lmtcb for pt.  

## 2016-03-15 ENCOUNTER — Ambulatory Visit: Payer: Medicare HMO | Admitting: Pulmonary Disease

## 2016-03-22 ENCOUNTER — Ambulatory Visit: Payer: Commercial Managed Care - HMO | Admitting: Gastroenterology

## 2016-03-22 ENCOUNTER — Other Ambulatory Visit: Payer: Self-pay

## 2016-03-23 IMAGING — CR DG CHEST 1V PORT
1 series · 1 of 1 positions shown · non-contrast
Comparison: Chest radiograph June 30, 2015

CLINICAL DATA: Shortness of breath beginning last night. History of
COPD and asthma.

EXAM:
PORTABLE CHEST 1 VIEW

[portable]
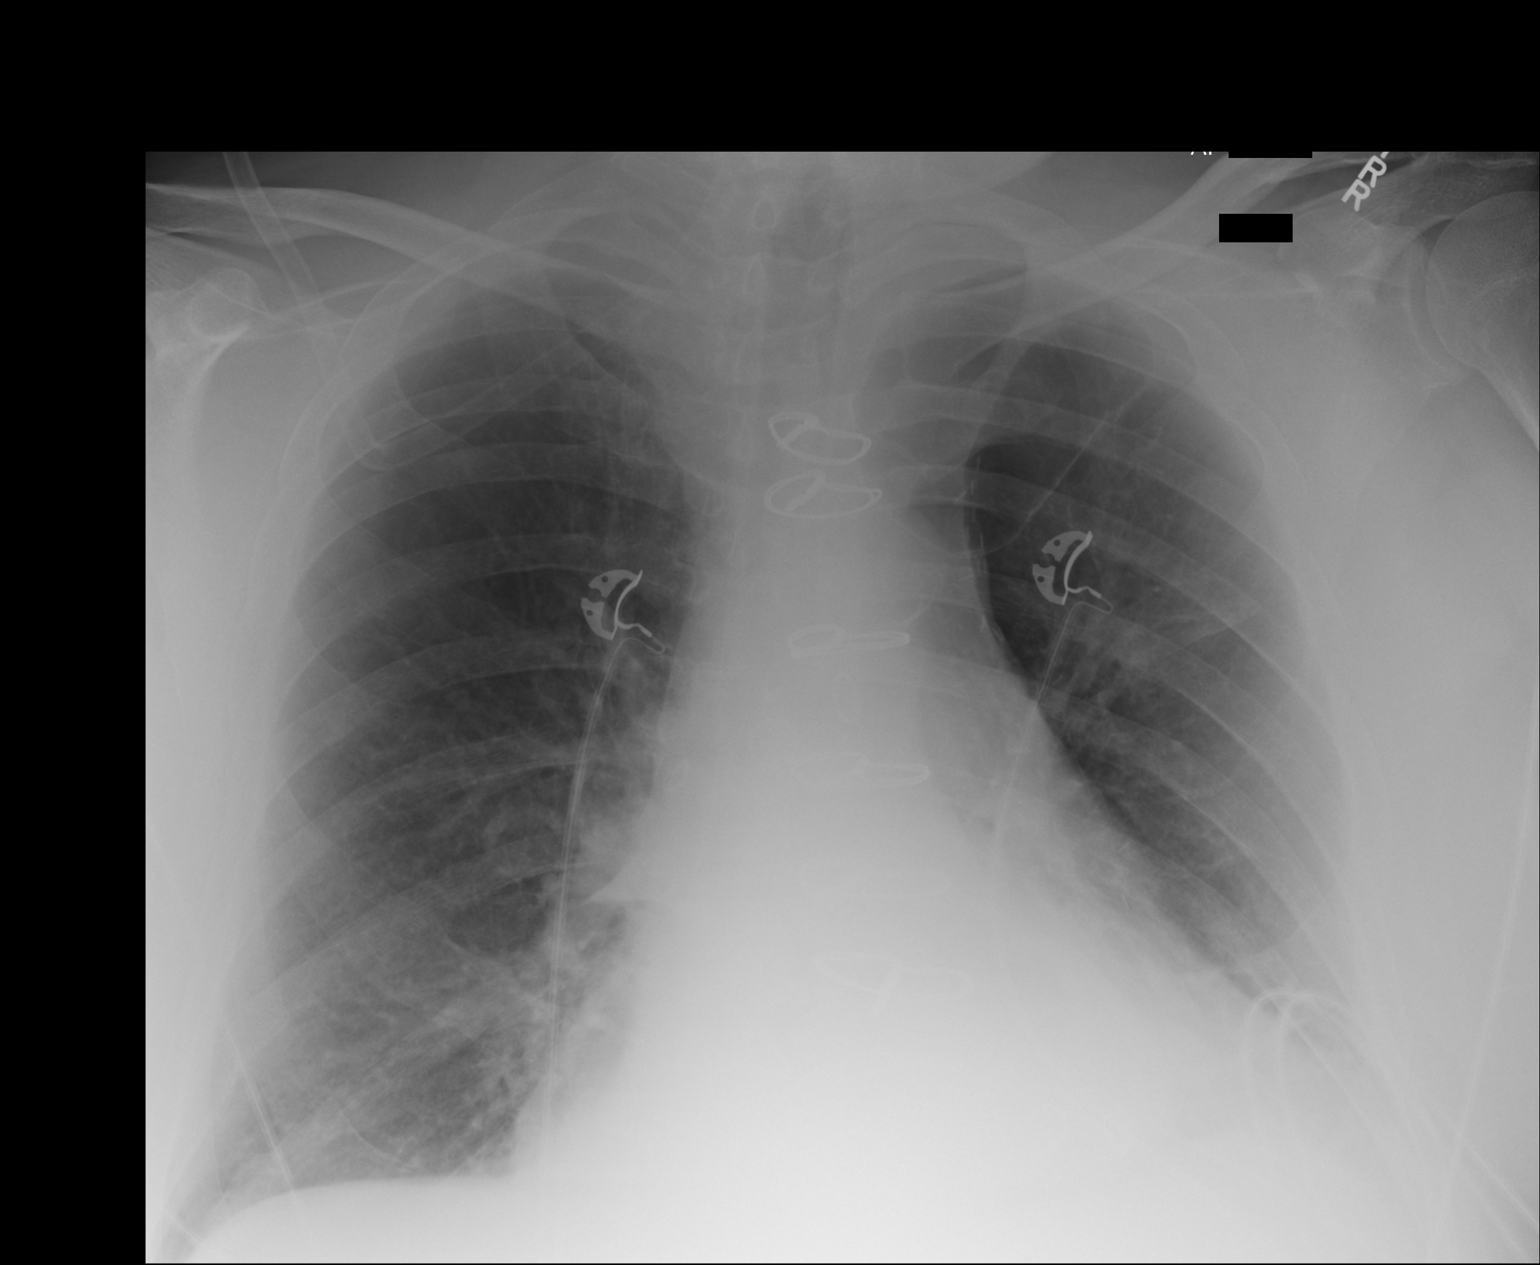

[1 of 1 positions shown; findings below may reference images not displayed]

FINDINGS: The cardiac silhouette is moderately enlarged, mediastinal
silhouette is nonsuspicious, status post median sternotomy. Mild
pulmonary vascular congestion. Elevated LEFT hemidiaphragm. No
pleural effusion or focal consolidation. No pneumothorax. Large body
habitus, osseous structure nonsuspicious. Interval removal of RIGHT
PICC. Large partially air-filled hiatal hernia.
IMPRESSION: Similar cardiomegaly, pulmonary vascular congestion.

Large hiatal hernia.

## 2016-04-03 ENCOUNTER — Other Ambulatory Visit: Payer: Self-pay | Admitting: Sports Medicine

## 2016-04-04 ENCOUNTER — Telehealth: Payer: Self-pay | Admitting: *Deleted

## 2016-04-04 NOTE — Telephone Encounter (Signed)
Refilled Gabapentin for 3 more months.  Left message instructing pt to make an appt prior to future refills.

## 2016-04-04 NOTE — Telephone Encounter (Signed)
Pt needs an appt prior to refills. 

## 2016-04-21 ENCOUNTER — Telehealth: Payer: Self-pay | Admitting: Pulmonary Disease

## 2016-04-21 NOTE — Telephone Encounter (Signed)
Can't do it, see last telephone note about this in May.  See if we can help him find another PCP

## 2016-04-21 NOTE — Telephone Encounter (Signed)
Spoke with pt. He is requesting BQ to refill his Xanax. States that his PCP is giving him a hard time about this refill. He told his PCP, "that's fine, I have other doctors who will give it to me." Current prescription seems to be for Xanax 0.25mg  -  2 tabs TID prn. States that Xanax helps with his breathing and helps him sleep when he takes this with Ambien.  BQ - please advise on refill. Thanks.

## 2016-04-21 NOTE — Telephone Encounter (Signed)
Called spoke with pt. Reviewed BQ's recs. He voiced understanding and had no further questions.

## 2016-05-05 ENCOUNTER — Telehealth: Payer: Self-pay | Admitting: Sports Medicine

## 2016-05-05 NOTE — Telephone Encounter (Signed)
FYI   Pt called stating he is returning a call that his phone was not working properly. He stated he did not understand why he needed an appt for the medication refill an to have his 2 great toenails trimmed because it was 45.00. He then stated that the medication Dr Cannon Kettle gave him was not working and he talked to his pcp and they told him to take 2 of them and he says that seems to be working for him. I did get pt scheduled to follow up on 8.18.17 in [place] with Dr Cannon Kettle.

## 2016-05-05 NOTE — Telephone Encounter (Signed)
I told pt if the PCP had changed his medication, the PCP needed to monitor the refills until he was seen by Dr. Cannon Kettle.  Pt states understanding and he says he has enough to get him to the 05/2016 appt.

## 2016-05-18 ENCOUNTER — Telehealth: Payer: Self-pay | Admitting: *Deleted

## 2016-05-18 NOTE — Telephone Encounter (Signed)
Request for correction of pt's gabapentin.  Dr. Cannon Kettle ordered pt to take Gabapentin 100mg  tid. Pt's PCP changed to Gabapentin 100mg  2 tablets tid.  I faxed note instructing PCP to handle pt's gabapentin refills until pt was seen by Dr. Cannon Kettle.

## 2016-05-27 ENCOUNTER — Telehealth: Payer: Self-pay | Admitting: Pulmonary Disease

## 2016-05-27 DIAGNOSIS — J439 Emphysema, unspecified: Secondary | ICD-10-CM

## 2016-05-27 NOTE — Telephone Encounter (Signed)
Called and spoke with pt and he stated that he will need an order sent in to cont the home health nurse.  She is going to contact our office as well.  BQ please advise if ok to place this order.

## 2016-05-28 IMAGING — CR DG CHEST 1V PORT
1 series · 1 of 1 positions shown · non-contrast
Comparison: Most recent radiograph 08/20/2015.  Chest CT 07/01/2015

CLINICAL DATA: Progressive shortness of breath for 6 days.

EXAM:
PORTABLE CHEST 1 VIEW

[portable]
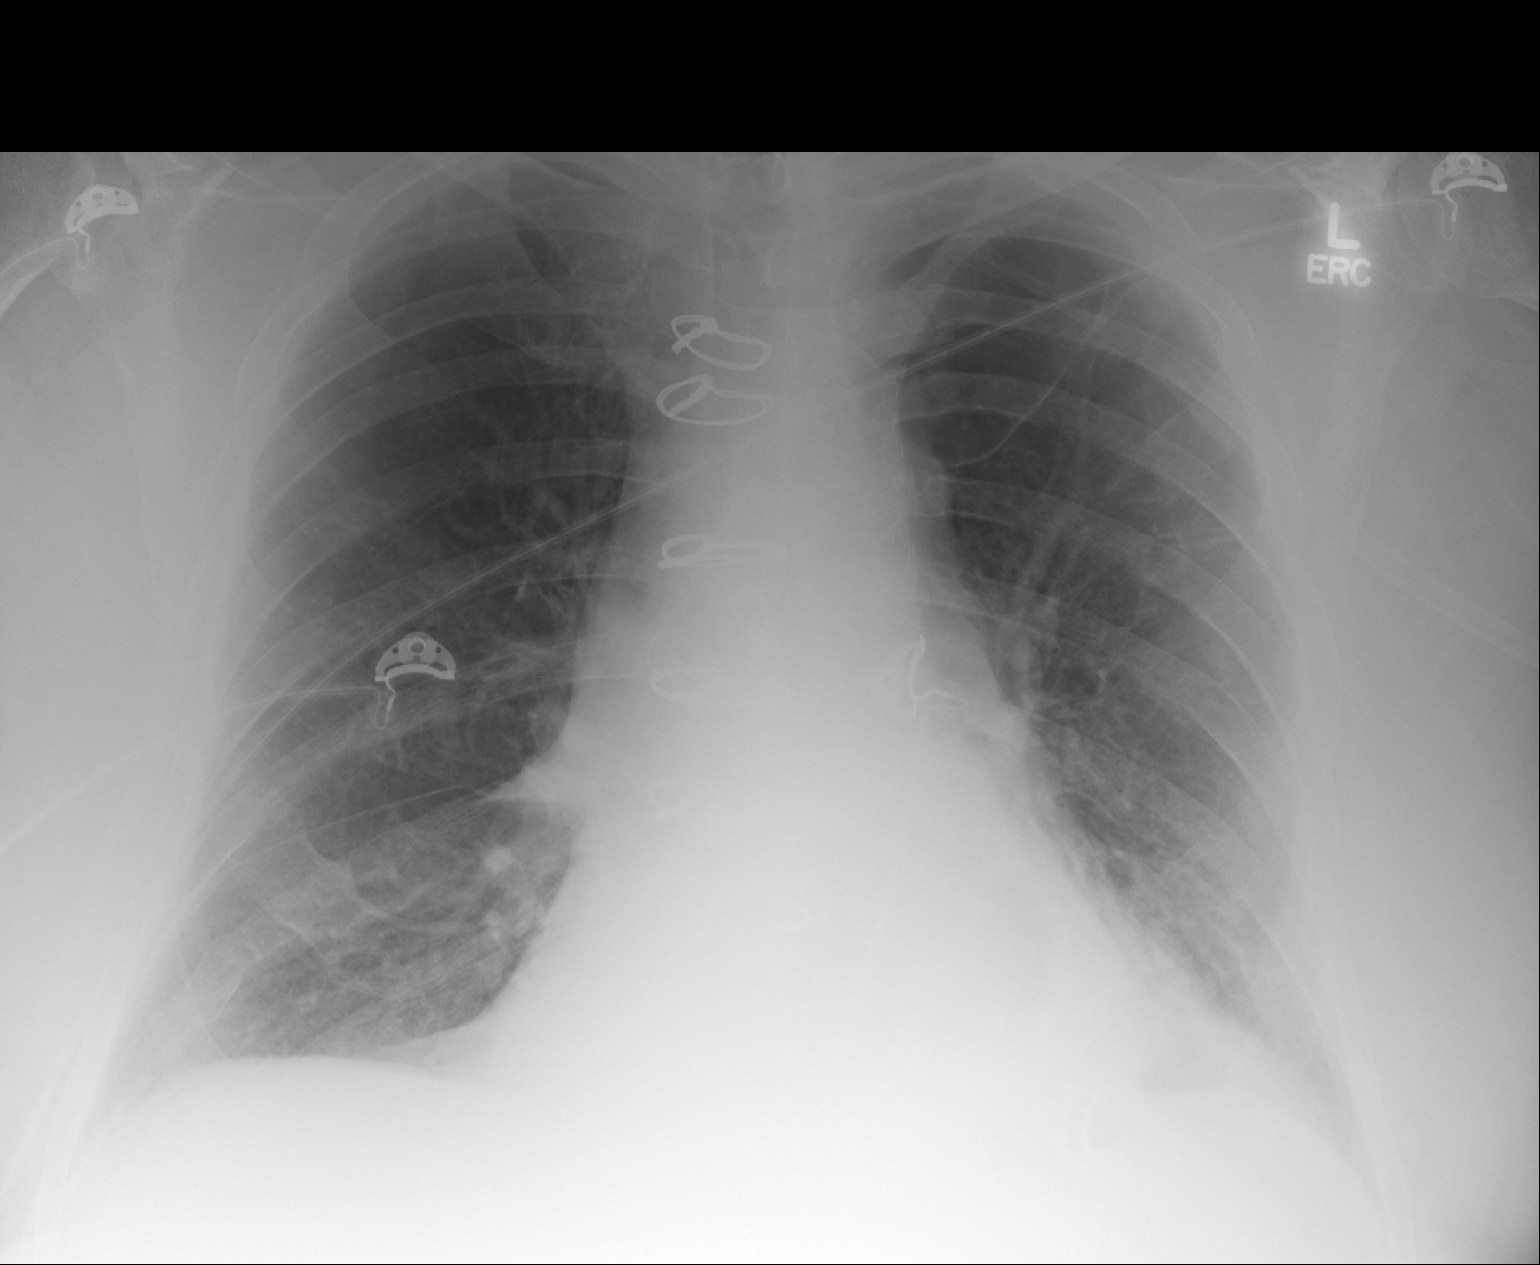

[1 of 1 positions shown; findings below may reference images not displayed]

FINDINGS: Patient is post median sternotomy. Stable cardiomegaly. Retrocardiac
hiatal hernia is again seen. Mild vascular congestion stable.
Bibasilar opacities, favor atelectasis. Left upper lobe bulla is
unchanged. No pneumothorax. No pulmonary edema.
IMPRESSION: 1. Cardiomegaly and vascular congestion, unchanged.
2. Large hiatal hernia.  Bibasilar opacities, favor atelectasis.

## 2016-05-31 NOTE — Telephone Encounter (Signed)
BQ please advise. thanks 

## 2016-06-02 IMAGING — CR DG CHEST 2V
2 series · 2 of 2 positions shown · non-contrast
Comparison: 10/25/2015

CLINICAL DATA: Short of breath. Cough. Patient believes he has
pneumonia. History of COPD and coronary artery disease.

EXAM:
CHEST  2 VIEW

[chest lat]
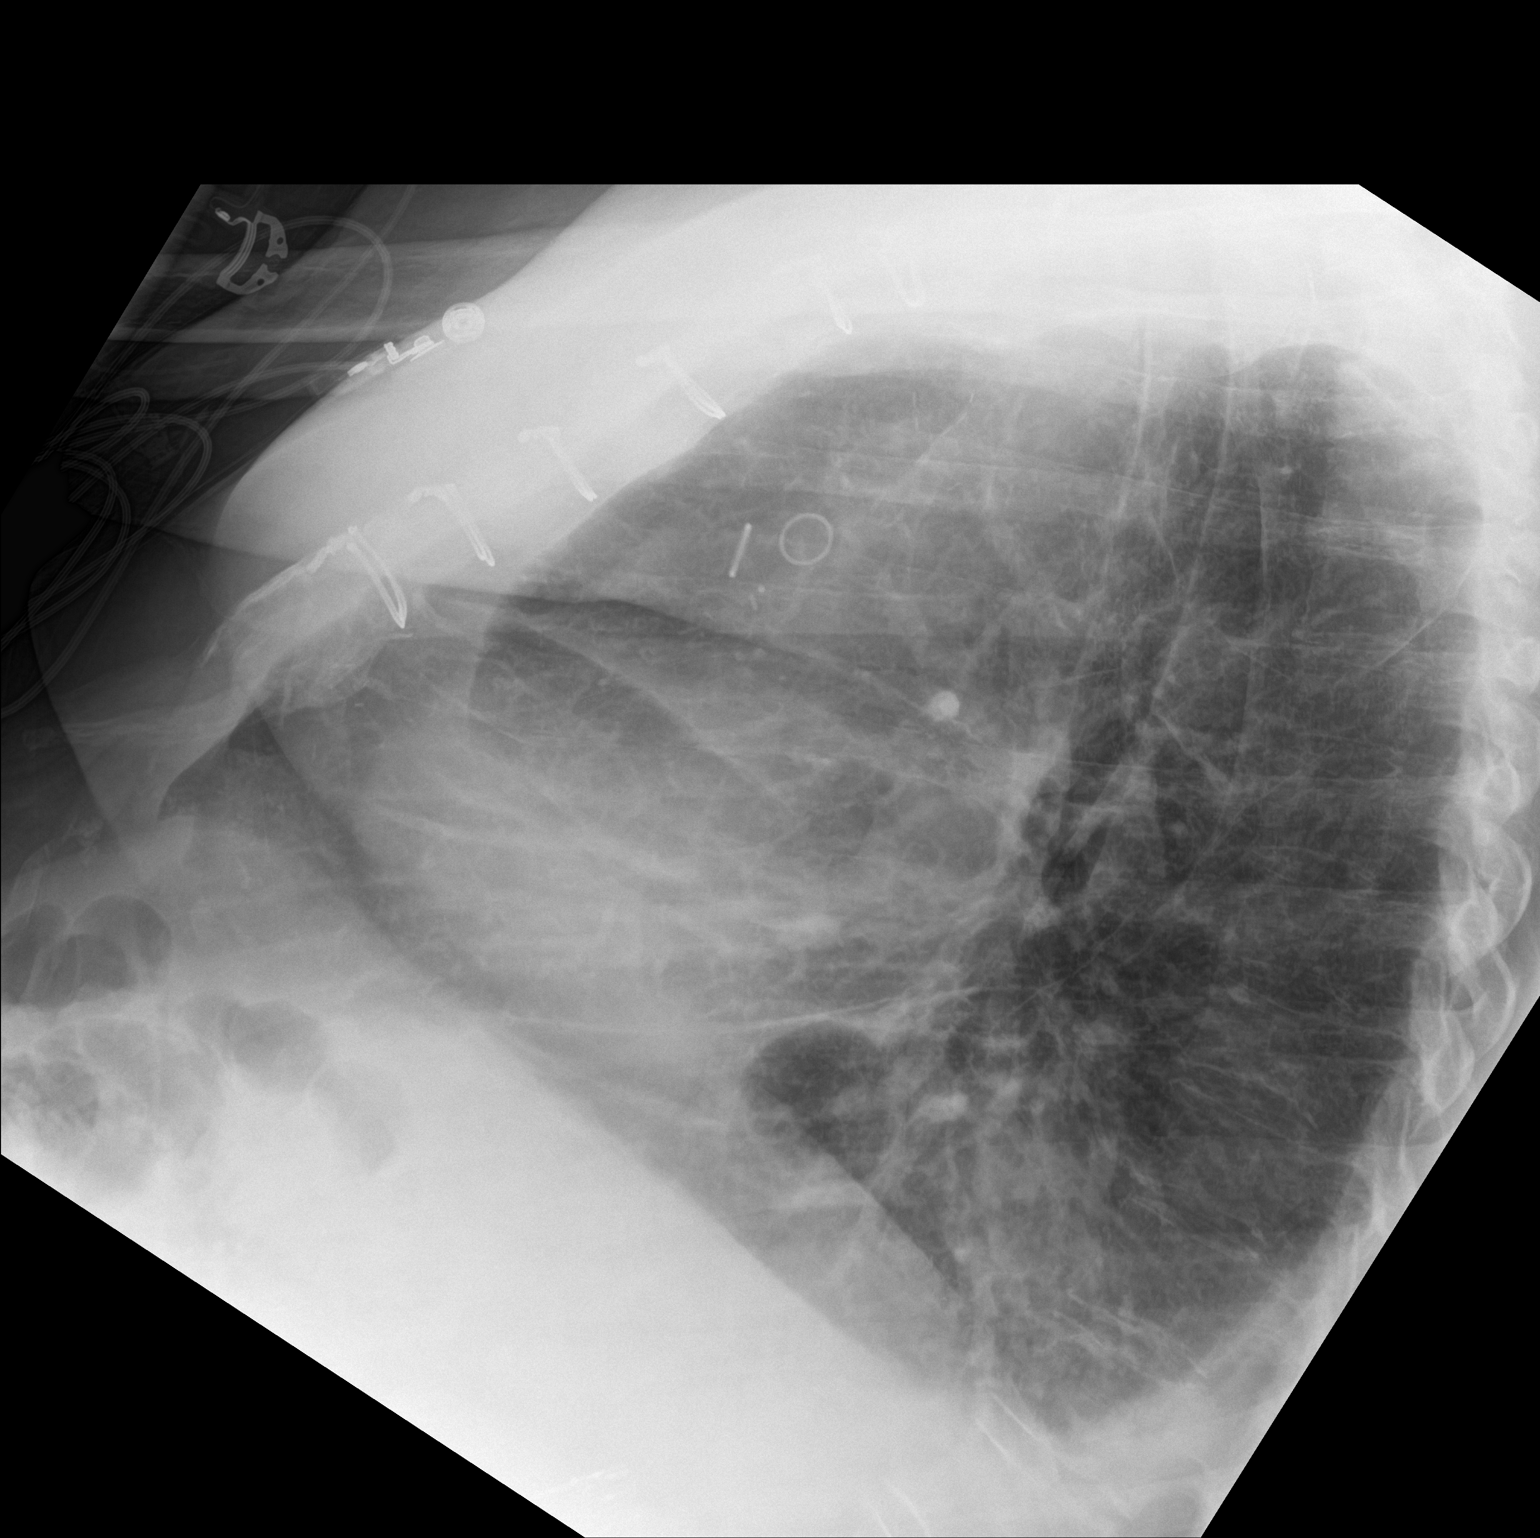

[chest ap]
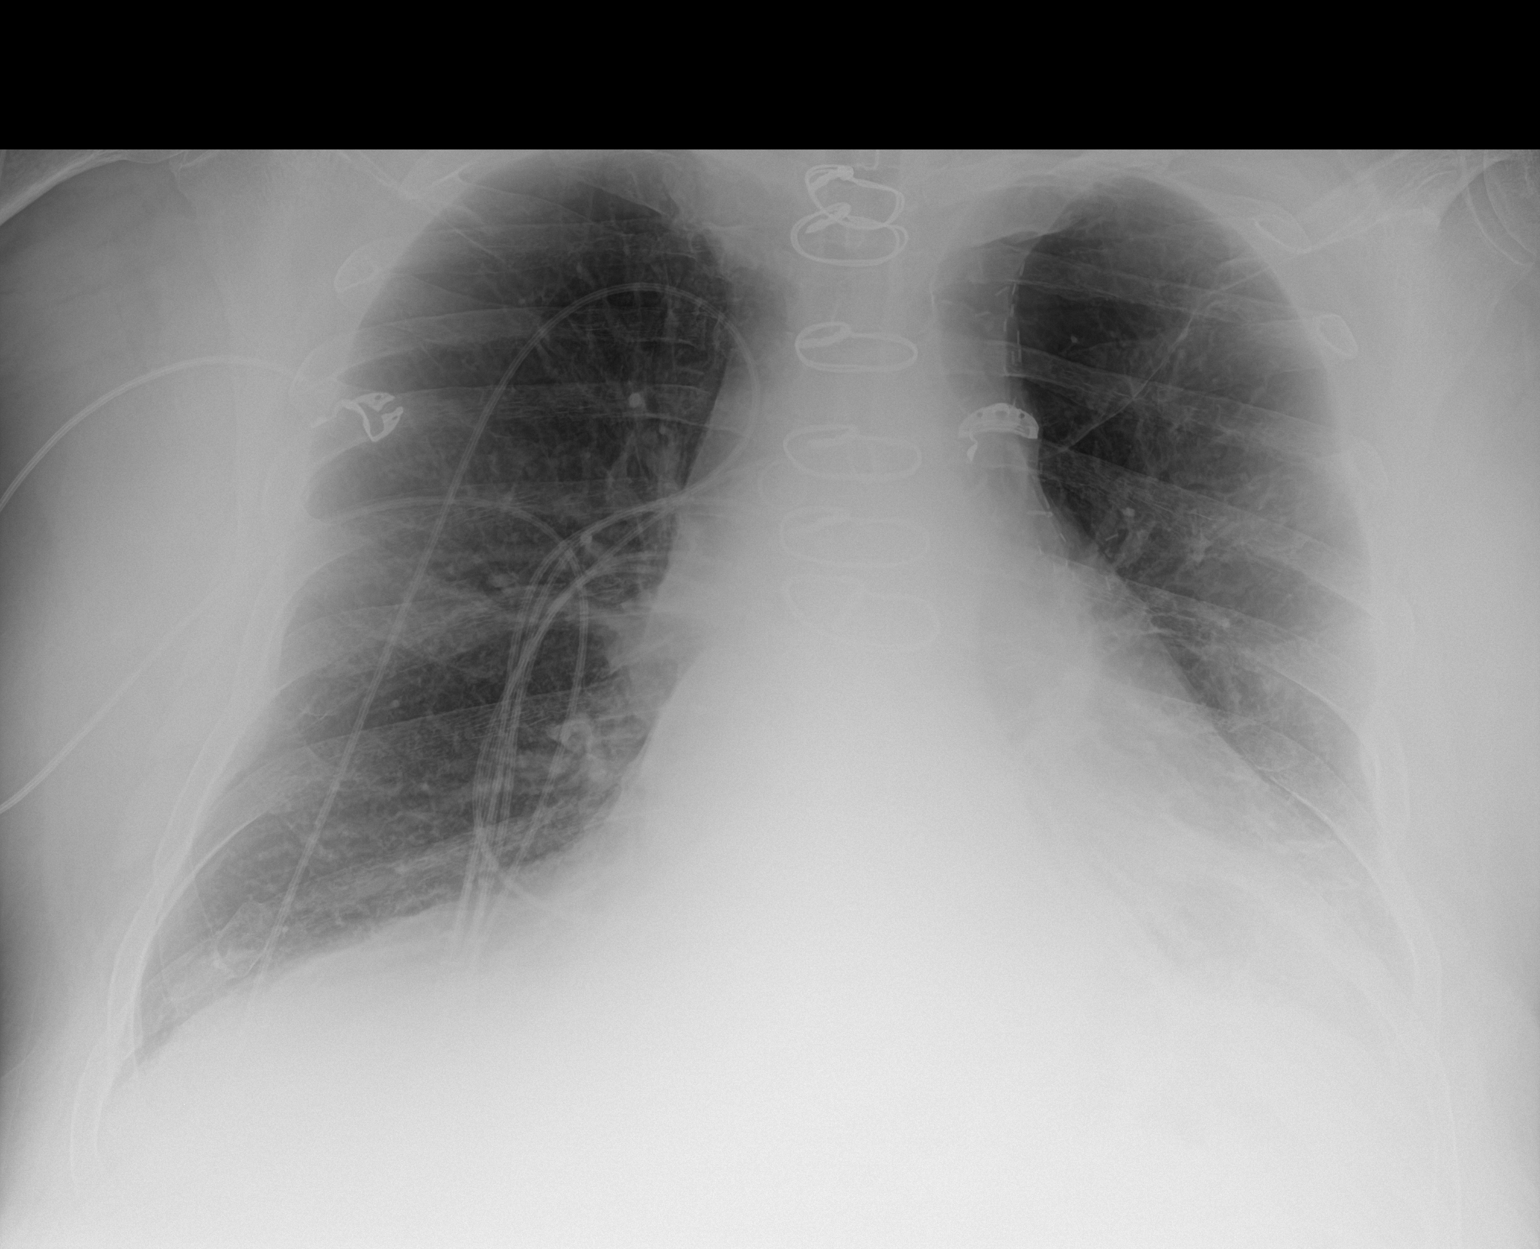

[2 of 2 positions shown; findings below may reference images not displayed]

FINDINGS: Changes from previous CABG surgery are stable. Cardiac silhouette is
mildly enlarged. No mediastinal or hilar masses or evidence of
adenopathy.

Moderate hiatal hernia. Additional lung base opacity most evident in
the left lower lobe, is similar to prior studies likely due to a
combination of atelectasis and chronic bronchitic change. However,
pneumonia is possible. Remainder of the lungs is clear.
Emphysematous bullae noted in the left apex.

Skeletal structures are demineralized but grossly intact.
IMPRESSION: 1. Left lower lobe opacity most likely combination of atelectasis
and chronic bronchitic change. Pneumonia is possible. This
appearance is similar to prior studies, arguing against pneumonia.
2. Moderate hiatal hernia. Stable changes from previous CABG
surgery. Mild cardiomegaly.

## 2016-06-03 ENCOUNTER — Ambulatory Visit: Payer: Medicare HMO | Admitting: Sports Medicine

## 2016-06-06 NOTE — Telephone Encounter (Signed)
lmomtcb for pt to find out what the home health is for and can he get this from his PCP.  Will await call from pt.

## 2016-06-06 NOTE — Telephone Encounter (Signed)
For what condition? Does he have a PCP?

## 2016-06-07 NOTE — Telephone Encounter (Signed)
Spoke with BQ regarding the complexity of HH orders, per BQ we can refer for [facility] with copd dx and request ear lavage.  This order has been placed.  lmtcb X1 for pt- need to get records from most recent PCP.

## 2016-06-07 NOTE — Telephone Encounter (Signed)
lmomtcb x 2  

## 2016-06-07 NOTE — Telephone Encounter (Signed)
I'm happy to help him but I need to see the records from his PCP so I know what the reason for referral.  Remind him he needs a new PCP.

## 2016-06-07 NOTE — Telephone Encounter (Signed)
Pt returning call.[name] ° °

## 2016-06-07 NOTE — Telephone Encounter (Signed)
Spoke with pt and he states that he has "fired" his current PCP because "she was very rude to him". He is currently trying to find a new PCP. He needs an order to continue his home health nurse through [employer]. Original order was from his previous PCP. Spoke with Levada Dy @ [facility] and she states that pt has been receiving home health nursing off and on for several years. Current order expires 06/12/16. Pt is also requesting order for bilateral ear lavage from home health nurse.   BQ - Please advise if ok to continue home health care and order ear lavage. Thanks!

## 2016-06-09 ENCOUNTER — Telehealth: Payer: Self-pay | Admitting: Pulmonary Disease

## 2016-06-09 DIAGNOSIS — F329 Major depressive disorder, single episode, unspecified: Secondary | ICD-10-CM

## 2016-06-09 DIAGNOSIS — F32A Depression, unspecified: Secondary | ICD-10-CM

## 2016-06-09 DIAGNOSIS — I10 Essential (primary) hypertension: Secondary | ICD-10-CM

## 2016-06-09 DIAGNOSIS — E785 Hyperlipidemia, unspecified: Secondary | ICD-10-CM

## 2016-06-09 DIAGNOSIS — F411 Generalized anxiety disorder: Secondary | ICD-10-CM

## 2016-06-09 NOTE — Telephone Encounter (Signed)
Called and spoke with pt and he will have his wife call his recent PCP and have them fax over his medical records to BQ.

## 2016-06-09 NOTE — Telephone Encounter (Signed)
Called spoke with pt. He reports his PCP is angry with him and has blocked his call.  He wants [place] to call there office and request the records since we are wanting these. Called PCP in chart [phone]. Was advised to fax request on our letter requesting records to [phone].  I have faxed over a request. Will forward to [place] to document once received.

## 2016-06-09 NOTE — Telephone Encounter (Signed)
Pt calling stating that he had give the wrong #, the correct # is [phone].Hillery Hunter

## 2016-06-10 NOTE — Telephone Encounter (Signed)
lmtcb for pt.  

## 2016-06-10 NOTE — Telephone Encounter (Signed)
Pt calling stating that his nurse is coming by to drop of some papers for him to get his med rec and says that he left of his ss# which is [id number].[name]

## 2016-06-14 NOTE — Telephone Encounter (Signed)
Attempted to call pt. Line was busy x2. Will call back.

## 2016-06-16 NOTE — Telephone Encounter (Signed)
Spoke with pt. He is aware of BQ's recommendation. Referral has been placed. Nothing further was needed.

## 2016-06-16 NOTE — Telephone Encounter (Signed)
[facility]

## 2016-06-16 NOTE — Telephone Encounter (Signed)
Spoke with pt. States that he would like BQ to advise him on what primary care physicians he recommends. Pt can't be referred to any primary care within [facility], he has been discharged from one office already.  BQ - please advise.  Thanks.

## 2016-06-30 ENCOUNTER — Telehealth: Payer: Self-pay | Admitting: Pulmonary Disease

## 2016-06-30 NOTE — Telephone Encounter (Signed)
Samples have been left at the front for pick up. lmtcb x1 for pt.

## 2016-07-01 NOTE — Telephone Encounter (Signed)
Patient returned call.  He was advised samples were left up front for pick up.  He states he will send his wife to get these.

## 2016-07-01 NOTE — Telephone Encounter (Signed)
LMOM TCB x2

## 2016-07-24 ENCOUNTER — Inpatient Hospital Stay
Admission: EM | Admit: 2016-07-24 | Discharge: 2016-07-27 | DRG: 871 | Disposition: A | Discharge status: Home Health | Payer: Medicare HMO | Attending: Internal Medicine | Admitting: Internal Medicine

## 2016-07-24 ENCOUNTER — Emergency Department: Payer: Medicare HMO

## 2016-07-24 DIAGNOSIS — J440 Chronic obstructive pulmonary disease with acute lower respiratory infection: Secondary | ICD-10-CM | POA: Diagnosis present

## 2016-07-24 DIAGNOSIS — E1122 Type 2 diabetes mellitus with diabetic chronic kidney disease: Secondary | ICD-10-CM | POA: Diagnosis present

## 2016-07-24 DIAGNOSIS — R710 Precipitous drop in hematocrit: Secondary | ICD-10-CM | POA: Diagnosis present

## 2016-07-24 DIAGNOSIS — Z880 Allergy status to penicillin: Secondary | ICD-10-CM

## 2016-07-24 DIAGNOSIS — K921 Melena: Secondary | ICD-10-CM | POA: Diagnosis present

## 2016-07-24 DIAGNOSIS — R2689 Other abnormalities of gait and mobility: Secondary | ICD-10-CM

## 2016-07-24 DIAGNOSIS — N189 Chronic kidney disease, unspecified: Secondary | ICD-10-CM | POA: Diagnosis present

## 2016-07-24 DIAGNOSIS — K922 Gastrointestinal hemorrhage, unspecified: Secondary | ICD-10-CM | POA: Diagnosis not present

## 2016-07-24 DIAGNOSIS — R1013 Epigastric pain: Secondary | ICD-10-CM | POA: Diagnosis not present

## 2016-07-24 DIAGNOSIS — A419 Sepsis, unspecified organism: Principal | ICD-10-CM | POA: Diagnosis present

## 2016-07-24 DIAGNOSIS — Z951 Presence of aortocoronary bypass graft: Secondary | ICD-10-CM

## 2016-07-24 DIAGNOSIS — J189 Pneumonia, unspecified organism: Secondary | ICD-10-CM | POA: Diagnosis present

## 2016-07-24 DIAGNOSIS — E872 Acidosis: Secondary | ICD-10-CM | POA: Diagnosis present

## 2016-07-24 DIAGNOSIS — I5032 Chronic diastolic (congestive) heart failure: Secondary | ICD-10-CM | POA: Diagnosis present

## 2016-07-24 DIAGNOSIS — Z66 Do not resuscitate: Secondary | ICD-10-CM | POA: Diagnosis present

## 2016-07-24 DIAGNOSIS — R262 Difficulty in walking, not elsewhere classified: Secondary | ICD-10-CM

## 2016-07-24 DIAGNOSIS — Z79899 Other long term (current) drug therapy: Secondary | ICD-10-CM

## 2016-07-24 DIAGNOSIS — I959 Hypotension, unspecified: Secondary | ICD-10-CM | POA: Diagnosis present

## 2016-07-24 DIAGNOSIS — Z6834 Body mass index (BMI) 34.0-34.9, adult: Secondary | ICD-10-CM

## 2016-07-24 DIAGNOSIS — Z87891 Personal history of nicotine dependence: Secondary | ICD-10-CM

## 2016-07-24 DIAGNOSIS — R0902 Hypoxemia: Secondary | ICD-10-CM | POA: Diagnosis present

## 2016-07-24 DIAGNOSIS — D72829 Elevated white blood cell count, unspecified: Secondary | ICD-10-CM

## 2016-07-24 DIAGNOSIS — R6520 Severe sepsis without septic shock: Secondary | ICD-10-CM | POA: Diagnosis present

## 2016-07-24 DIAGNOSIS — F319 Bipolar disorder, unspecified: Secondary | ICD-10-CM | POA: Diagnosis present

## 2016-07-24 DIAGNOSIS — R509 Fever, unspecified: Secondary | ICD-10-CM

## 2016-07-24 DIAGNOSIS — Z23 Encounter for immunization: Secondary | ICD-10-CM

## 2016-07-24 DIAGNOSIS — I252 Old myocardial infarction: Secondary | ICD-10-CM

## 2016-07-24 DIAGNOSIS — N179 Acute kidney failure, unspecified: Secondary | ICD-10-CM | POA: Diagnosis present

## 2016-07-24 DIAGNOSIS — D500 Iron deficiency anemia secondary to blood loss (chronic): Secondary | ICD-10-CM | POA: Diagnosis present

## 2016-07-24 DIAGNOSIS — R531 Weakness: Secondary | ICD-10-CM

## 2016-07-24 DIAGNOSIS — Z794 Long term (current) use of insulin: Secondary | ICD-10-CM

## 2016-07-24 DIAGNOSIS — I130 Hypertensive heart and chronic kidney disease with heart failure and stage 1 through stage 4 chronic kidney disease, or unspecified chronic kidney disease: Secondary | ICD-10-CM | POA: Diagnosis present

## 2016-07-24 DIAGNOSIS — I2510 Atherosclerotic heart disease of native coronary artery without angina pectoris: Secondary | ICD-10-CM | POA: Diagnosis present

## 2016-07-24 DIAGNOSIS — Z8249 Family history of ischemic heart disease and other diseases of the circulatory system: Secondary | ICD-10-CM

## 2016-07-24 DIAGNOSIS — E785 Hyperlipidemia, unspecified: Secondary | ICD-10-CM | POA: Diagnosis present

## 2016-07-24 DIAGNOSIS — Z825 Family history of asthma and other chronic lower respiratory diseases: Secondary | ICD-10-CM

## 2016-07-24 DIAGNOSIS — Z9981 Dependence on supplemental oxygen: Secondary | ICD-10-CM

## 2016-07-24 DIAGNOSIS — Z833 Family history of diabetes mellitus: Secondary | ICD-10-CM

## 2016-07-24 DIAGNOSIS — Z7951 Long term (current) use of inhaled steroids: Secondary | ICD-10-CM

## 2016-07-24 LAB — URINALYSIS COMPLETE WITH MICROSCOPIC (ARMC ONLY)
BACTERIA UA: NONE SEEN
Bilirubin Urine: NEGATIVE
Glucose, UA: 50 mg/dL — AB
Hgb urine dipstick: NEGATIVE
Ketones, ur: NEGATIVE mg/dL
Leukocytes, UA: NEGATIVE
Nitrite: NEGATIVE
PROTEIN: NEGATIVE mg/dL
RBC / HPF: NONE SEEN RBC/hpf (ref 0–5)
SQUAMOUS EPITHELIAL / LPF: NONE SEEN
Specific Gravity, Urine: 1.01 (ref 1.005–1.030)
WBC UA: NONE SEEN WBC/hpf (ref 0–5)
pH: 5 (ref 5.0–8.0)

## 2016-07-24 LAB — COMPREHENSIVE METABOLIC PANEL
ALBUMIN: 3.7 g/dL (ref 3.5–5.0)
ALK PHOS: 130 U/L — AB (ref 38–126)
ALT: 18 U/L (ref 17–63)
AST: 30 U/L (ref 15–41)
Anion gap: 8 (ref 5–15)
BILIRUBIN TOTAL: 0.6 mg/dL (ref 0.3–1.2)
BUN: 26 mg/dL — AB (ref 6–20)
CO2: 32 mmol/L (ref 22–32)
CREATININE: 1.37 mg/dL — AB (ref 0.61–1.24)
Calcium: 7.9 mg/dL — ABNORMAL LOW (ref 8.9–10.3)
Chloride: 94 mmol/L — ABNORMAL LOW (ref 101–111)
GFR calc Af Amer: 57 mL/min — ABNORMAL LOW (ref >60)
GFR, EST NON AFRICAN AMERICAN: 49 mL/min — AB (ref >60)
GLUCOSE: 325 mg/dL — AB (ref 65–99)
POTASSIUM: 4.2 mmol/L (ref 3.5–5.1)
Sodium: 134 mmol/L — ABNORMAL LOW (ref 135–145)
TOTAL PROTEIN: 7.1 g/dL (ref 6.5–8.1)

## 2016-07-24 LAB — BLOOD CULTURE ID PANEL (REFLEXED)
ACINETOBACTER BAUMANNII: NOT DETECTED
CANDIDA ALBICANS: NOT DETECTED
CANDIDA KRUSEI: NOT DETECTED
CANDIDA PARAPSILOSIS: NOT DETECTED
CANDIDA TROPICALIS: NOT DETECTED
Candida glabrata: NOT DETECTED
ENTEROBACTERIACEAE SPECIES: NOT DETECTED
ENTEROCOCCUS SPECIES: NOT DETECTED
Enterobacter cloacae complex: NOT DETECTED
Escherichia coli: NOT DETECTED
HAEMOPHILUS INFLUENZAE: NOT DETECTED
KLEBSIELLA PNEUMONIAE: NOT DETECTED
Klebsiella oxytoca: NOT DETECTED
Listeria monocytogenes: NOT DETECTED
NEISSERIA MENINGITIDIS: NOT DETECTED
Proteus species: NOT DETECTED
Pseudomonas aeruginosa: NOT DETECTED
STREPTOCOCCUS PYOGENES: NOT DETECTED
STREPTOCOCCUS SPECIES: DETECTED — AB
Serratia marcescens: NOT DETECTED
Staphylococcus aureus (BCID): NOT DETECTED
Staphylococcus species: NOT DETECTED
Streptococcus agalactiae: NOT DETECTED
Streptococcus pneumoniae: NOT DETECTED

## 2016-07-24 LAB — GLUCOSE, CAPILLARY
Glucose-Capillary: 329 mg/dL — ABNORMAL HIGH (ref 65–99)
Glucose-Capillary: 278 mg/dL — ABNORMAL HIGH (ref 65–99)
Glucose-Capillary: 260 mg/dL — ABNORMAL HIGH (ref 65–99)
Glucose-Capillary: 230 mg/dL — ABNORMAL HIGH (ref 65–99)
Glucose-Capillary: 260 mg/dL — ABNORMAL HIGH (ref 65–99)

## 2016-07-24 LAB — PROTIME-INR
INR: 1.09
Prothrombin Time: 14.1 seconds (ref 11.4–15.2)

## 2016-07-24 LAB — TROPONIN I: Troponin I: <0.03 ng/mL (ref <0.03)

## 2016-07-24 LAB — CBC WITH DIFFERENTIAL/PLATELET
BASOS ABS: 0.1 10*3/uL (ref 0–0.1)
Basophils Relative: 1 %
EOS ABS: 0.2 10*3/uL (ref 0–0.7)
EOS PCT: 1 %
HCT: 29.9 % — ABNORMAL LOW (ref 40.0–52.0)
Hemoglobin: 9.5 g/dL — ABNORMAL LOW (ref 13.0–18.0)
LYMPHS ABS: 0.8 10*3/uL — AB (ref 1.0–3.6)
LYMPHS PCT: 4 %
MCH: 30.7 pg (ref 26.0–34.0)
MCHC: 31.9 g/dL — ABNORMAL LOW (ref 32.0–36.0)
MCV: 96.2 fL (ref 80.0–100.0)
MONO ABS: 0.8 10*3/uL (ref 0.2–1.0)
Monocytes Relative: 4 %
Neutro Abs: 16.8 10*3/uL — ABNORMAL HIGH (ref 1.4–6.5)
Neutrophils Relative %: 90 %
PLATELETS: 250 10*3/uL (ref 150–440)
RBC: 3.11 MIL/uL — ABNORMAL LOW (ref 4.40–5.90)
RDW: 17.6 % — AB (ref 11.5–14.5)
WBC: 18.6 10*3/uL — AB (ref 3.8–10.6)

## 2016-07-24 LAB — TSH: TSH: 3.365 u[IU]/mL (ref 0.350–4.500)

## 2016-07-24 LAB — LIPASE, BLOOD: LIPASE: 44 U/L (ref 11–51)

## 2016-07-24 LAB — LACTIC ACID, PLASMA
Lactic Acid, Venous: 3.3 mmol/L (ref 0.5–1.9)
Lactic Acid, Venous: 2.2 mmol/L (ref 0.5–1.9)

## 2016-07-24 MED ORDER — TORSEMIDE 20 MG PO TABS
40.0000 mg | ORAL_TABLET | Freq: Every day | ORAL | Status: DC
  Administered 2016-07-24 – 2016-07-27 (×4): 40 mg via ORAL
  Filled 2016-07-24 (×4): qty 2

## 2016-07-24 MED ORDER — GABAPENTIN 300 MG PO CAPS
300.0000 mg | ORAL_CAPSULE | Freq: Three times a day (TID) | ORAL | Status: DC
  Administered 2016-07-24 – 2016-07-27 (×10): 300 mg via ORAL
  Filled 2016-07-24 (×11): qty 1

## 2016-07-24 MED ORDER — ATORVASTATIN CALCIUM 20 MG PO TABS
40.0000 mg | ORAL_TABLET | Freq: Every day | ORAL | Status: DC
  Administered 2016-07-24 – 2016-07-26 (×3): 40 mg via ORAL
  Filled 2016-07-24 (×3): qty 2

## 2016-07-24 MED ORDER — UMECLIDINIUM BROMIDE 62.5 MCG/INH IN AEPB: 1.0000 | INHALATION_SPRAY | Freq: Every day | RESPIRATORY_TRACT | Status: DC

## 2016-07-24 MED ORDER — LISINOPRIL 5 MG PO TABS
2.5000 mg | ORAL_TABLET | Freq: Every day | ORAL | Status: DC
  Filled 2016-07-24: qty 1

## 2016-07-24 MED ORDER — ALPRAZOLAM 0.5 MG PO TABS
0.5000 mg | ORAL_TABLET | Freq: Three times a day (TID) | ORAL | Status: DC | PRN
  Administered 2016-07-24 – 2016-07-26 (×4): 0.5 mg via ORAL
  Filled 2016-07-24 (×4): qty 1

## 2016-07-24 MED ORDER — SODIUM CHLORIDE 0.9 % IV SOLN
8.0000 mg/h | INTRAVENOUS | Status: DC
  Administered 2016-07-24 – 2016-07-26 (×5): 8 mg/h via INTRAVENOUS
  Filled 2016-07-24 (×6): qty 80

## 2016-07-24 MED ORDER — SUCRALFATE 1 G PO TABS
1.0000 g | ORAL_TABLET | Freq: Three times a day (TID) | ORAL | Status: DC
  Administered 2016-07-24 – 2016-07-27 (×13): 1 g via ORAL
  Filled 2016-07-24 (×13): qty 1

## 2016-07-24 MED ORDER — VITAMIN D 1000 UNITS PO TABS
1000.0000 [IU] | ORAL_TABLET | Freq: Every day | ORAL | Status: DC
  Administered 2016-07-24 – 2016-07-27 (×4): 1000 [IU] via ORAL
  Filled 2016-07-24 (×4): qty 1

## 2016-07-24 MED ORDER — ORAL CARE MOUTH RINSE
15.0000 mL | Freq: Two times a day (BID) | OROMUCOSAL | Status: DC
  Administered 2016-07-24 – 2016-07-26 (×6): 15 mL via OROMUCOSAL

## 2016-07-24 MED ORDER — INFLUENZA VAC SPLIT QUAD 0.5 ML IM SUSY
0.5000 mL | PREFILLED_SYRINGE | INTRAMUSCULAR | Status: AC
  Administered 2016-07-25: 0.5 mL via INTRAMUSCULAR
  Filled 2016-07-24: qty 0.5

## 2016-07-24 MED ORDER — VANCOMYCIN HCL IN DEXTROSE 1-5 GM/200ML-% IV SOLN
1000.0000 mg | Freq: Once | INTRAVENOUS | Status: AC
  Administered 2016-07-24: 1000 mg via INTRAVENOUS
  Filled 2016-07-24: qty 200

## 2016-07-24 MED ORDER — ALBUTEROL SULFATE (2.5 MG/3ML) 0.083% IN NEBU
2.5000 mg | INHALATION_SOLUTION | RESPIRATORY_TRACT | Status: DC | PRN
  Administered 2016-07-26 (×2): 2.5 mg via RESPIRATORY_TRACT
  Filled 2016-07-24 (×2): qty 3

## 2016-07-24 MED ORDER — INSULIN GLARGINE 100 UNIT/ML ~~LOC~~ SOLN
14.0000 [IU] | Freq: Every day | SUBCUTANEOUS | Status: DC
  Administered 2016-07-24 – 2016-07-25 (×2): 14 [IU] via SUBCUTANEOUS
  Filled 2016-07-24 (×3): qty 0.14

## 2016-07-24 MED ORDER — INSULIN ASPART 100 UNIT/ML ~~LOC~~ SOLN
0.0000 [IU] | Freq: Three times a day (TID) | SUBCUTANEOUS | Status: DC
  Administered 2016-07-24: 5 [IU] via SUBCUTANEOUS
  Administered 2016-07-24: 8 [IU] via SUBCUTANEOUS
  Administered 2016-07-25: 3 [IU] via SUBCUTANEOUS
  Administered 2016-07-25: 5 [IU] via SUBCUTANEOUS
  Administered 2016-07-25: 15 [IU] via SUBCUTANEOUS
  Administered 2016-07-26: 8 [IU] via SUBCUTANEOUS
  Administered 2016-07-26: 15 [IU] via SUBCUTANEOUS
  Filled 2016-07-24: qty 15
  Filled 2016-07-24: qty 3
  Filled 2016-07-24: qty 15
  Filled 2016-07-24: qty 5
  Filled 2016-07-24: qty 15
  Filled 2016-07-24: qty 5
  Filled 2016-07-24 (×2): qty 8

## 2016-07-24 MED ORDER — ONDANSETRON HCL 4 MG PO TABS: 4.0000 mg | ORAL_TABLET | Freq: Four times a day (QID) | ORAL | Status: DC | PRN

## 2016-07-24 MED ORDER — SODIUM CHLORIDE 0.9% FLUSH
3.0000 mL | Freq: Two times a day (BID) | INTRAVENOUS | Status: DC
  Administered 2016-07-25 – 2016-07-27 (×4): 3 mL via INTRAVENOUS

## 2016-07-24 MED ORDER — SODIUM CHLORIDE 0.9 % IV BOLUS (SEPSIS)
1000.0000 mL | Freq: Once | INTRAVENOUS | Status: AC
  Administered 2016-07-24: 1000 mL via INTRAVENOUS

## 2016-07-24 MED ORDER — TRAMADOL-ACETAMINOPHEN 37.5-325 MG PO TABS
1.0000 | ORAL_TABLET | Freq: Two times a day (BID) | ORAL | Status: DC | PRN
  Administered 2016-07-24 – 2016-07-26 (×4): 1 via ORAL
  Filled 2016-07-24 (×4): qty 1

## 2016-07-24 MED ORDER — FERROUS SULFATE 325 (65 FE) MG PO TABS
325.0000 mg | ORAL_TABLET | Freq: Three times a day (TID) | ORAL | Status: DC
  Administered 2016-07-24 – 2016-07-27 (×10): 325 mg via ORAL
  Filled 2016-07-24 (×10): qty 1

## 2016-07-24 MED ORDER — ACETAMINOPHEN 500 MG PO TABS
1000.0000 mg | ORAL_TABLET | Freq: Once | ORAL | Status: AC
  Administered 2016-07-24: 1000 mg via ORAL

## 2016-07-24 MED ORDER — SODIUM CHLORIDE 0.9 % IV SOLN
80.0000 mg | Freq: Once | INTRAVENOUS | Status: AC
  Administered 2016-07-24: 80 mg via INTRAVENOUS
  Filled 2016-07-24: qty 80

## 2016-07-24 MED ORDER — ONDANSETRON HCL 4 MG/2ML IJ SOLN: 4.0000 mg | Freq: Four times a day (QID) | INTRAMUSCULAR | Status: DC | PRN

## 2016-07-24 MED ORDER — CITALOPRAM HYDROBROMIDE 20 MG PO TABS
40.0000 mg | ORAL_TABLET | Freq: Every day | ORAL | Status: DC
  Administered 2016-07-24 – 2016-07-27 (×4): 40 mg via ORAL
  Filled 2016-07-24 (×4): qty 2

## 2016-07-24 MED ORDER — ACETAMINOPHEN 650 MG RE SUPP: 650.0000 mg | Freq: Four times a day (QID) | RECTAL | Status: DC | PRN

## 2016-07-24 MED ORDER — ACETAMINOPHEN 500 MG PO TABS
ORAL_TABLET | ORAL | Status: AC
  Filled 2016-07-24: qty 2

## 2016-07-24 MED ORDER — DOCUSATE SODIUM 100 MG PO CAPS
100.0000 mg | ORAL_CAPSULE | Freq: Two times a day (BID) | ORAL | Status: DC
  Administered 2016-07-24 – 2016-07-27 (×7): 100 mg via ORAL
  Filled 2016-07-24 (×7): qty 1

## 2016-07-24 MED ORDER — SODIUM CHLORIDE 0.9 % IV SOLN
INTRAVENOUS | Status: DC
  Administered 2016-07-24 – 2016-07-25 (×4): via INTRAVENOUS

## 2016-07-24 MED ORDER — MOMETASONE FURO-FORMOTEROL FUM 200-5 MCG/ACT IN AERO
2.0000 | INHALATION_SPRAY | Freq: Two times a day (BID) | RESPIRATORY_TRACT | Status: DC
  Administered 2016-07-24 – 2016-07-27 (×7): 2 via RESPIRATORY_TRACT
  Filled 2016-07-24: qty 8.8

## 2016-07-24 MED ORDER — METOPROLOL TARTRATE 25 MG PO TABS
25.0000 mg | ORAL_TABLET | Freq: Two times a day (BID) | ORAL | Status: DC
  Filled 2016-07-24: qty 1

## 2016-07-24 MED ORDER — DEXTROSE 5 % IV SOLN
2.0000 g | Freq: Three times a day (TID) | INTRAVENOUS | Status: DC
  Administered 2016-07-24 – 2016-07-26 (×7): 2 g via INTRAVENOUS
  Filled 2016-07-24 (×9): qty 2

## 2016-07-24 MED ORDER — VANCOMYCIN HCL 10 G IV SOLR
1250.0000 mg | Freq: Two times a day (BID) | INTRAVENOUS | Status: DC
  Administered 2016-07-24 – 2016-07-26 (×4): 1250 mg via INTRAVENOUS
  Filled 2016-07-24 (×6): qty 1250

## 2016-07-24 MED ORDER — ZOLPIDEM TARTRATE 5 MG PO TABS
5.0000 mg | ORAL_TABLET | Freq: Every evening | ORAL | Status: DC | PRN
  Administered 2016-07-24 – 2016-07-26 (×3): 5 mg via ORAL
  Filled 2016-07-24 (×3): qty 1

## 2016-07-24 MED ORDER — HEPARIN SODIUM (PORCINE) 5000 UNIT/ML IJ SOLN: 5000.0000 [IU] | Freq: Three times a day (TID) | INTRAMUSCULAR | Status: DC

## 2016-07-24 MED ORDER — ACETAMINOPHEN 325 MG PO TABS
650.0000 mg | ORAL_TABLET | Freq: Four times a day (QID) | ORAL | Status: DC | PRN
Start: 2016-07-24 — End: 2016-07-27
  Administered 2016-07-24 – 2016-07-25 (×2): 650 mg via ORAL
  Filled 2016-07-24 (×2): qty 2

## 2016-07-24 MED ORDER — SODIUM CHLORIDE 0.9 % IV BOLUS (SEPSIS): 1000.0000 mL | Freq: Once | INTRAVENOUS | Status: DC

## 2016-07-24 NOTE — Progress Notes (Signed)
Pharmacy Antibiotic Note  ANTOIN [name] is a 75 y.o. male admitted on 07/24/2016 with sepsis.  Pharmacy has been consulted for vancomycin and aztreonam dosing.  Plan: DW 97kg  Vd 68L kei 0.058 hr-1  T1/2 12 hours Vancomycin 1250 mg q 12 hours ordered. Not candidate for stacked dosing. Level before 5th dose. Goal trough 15-20.  Aztreonam 2 grams q 8 hours ordered.  Height: 6\' 2"  (188 cm) Weight: 262 lb (118.8 kg) IBW/kg (Calculated) : 82.2  Temp (24hrs), Avg:101 F (38.3 C), Min:98.7 F (37.1 C), Max:103.1 F (39.5 C)   Recent Labs Lab 07/24/16 0448 07/24/16 0532  WBC 18.6*  --   CREATININE 1.37*  --   LATICACIDVEN  --  3.3*    Estimated Creatinine Clearance: 64.8 mL/min (by C-G formula based on SCr of 1.37 mg/dL (H)).    Allergies  Allergen Reactions  . Morphine And Related Shortness Of Breath, Nausea And Vomiting, Rash and Other (See Comments)    Reaction:  Hallucinations   . Penicillins Anaphylaxis, Hives and Other (See Comments)    Has patient had a PCN reaction causing immediate rash, facial/tongue/throat swelling, SOB or lightheadedness with hypotension: Yes Has patient had a PCN reaction causing severe rash involving mucus membranes or skin necrosis: No Has patient had a PCN reaction that required hospitalization No Has patient had a PCN reaction occurring within the last 10 years: No If all of the above answers are "NO", then may proceed with Cephalosporin use.  . Demerol [Meperidine] Other (See Comments)    Reaction:  Hallucinations    . Dilaudid [Hydromorphone Hcl] Other (See Comments)    Reaction:  Hallucinations   . Levofloxacin Other (See Comments)    Reaction:  Unknown     Antimicrobials this admission: vancomycin  >>  aztreonam  >>   Dose adjustments this admission:   Microbiology results: 10/8 BCx: pending 10/8 UCx: pending     10/8 UA: (-)  10/8 CXR: atelectasis vs. Infection  Thank you for allowing pharmacy to be a part of this  patient's care.  [name] S 07/24/2016 7:19 AM

## 2016-07-24 NOTE — Care Management Important Message (Signed)
Important Message  Patient Details  Name: [name] MRN: [id number] Date of Birth: 30-May-1941   Medicare Important Message Given:  Yes    Adynn Caseres A, RN 07/24/2016, 2:43 PM

## 2016-07-24 NOTE — Progress Notes (Signed)
[facility] [place] NAME: [name]    MR#:  [id number]  DATE OF BIRTH:  09-Nov-1940  SUBJECTIVE:  CHIEF COMPLAINT:   Chief Complaint  Patient presents with  . Weakness  . Abdominal Pain     Came with Pneumonia and hypoxia, also have dark stool. BP running in lower side.  REVIEW OF SYSTEMS:  CONSTITUTIONAL: No fever, fatigue or weakness.  EYES: No blurred or double vision.  EARS, NOSE, AND THROAT: No tinnitus or ear pain.  RESPIRATORY: No cough, shortness of breath, wheezing or hemoptysis.  CARDIOVASCULAR: No chest pain, orthopnea, edema.  GASTROINTESTINAL: No nausea, vomiting, diarrhea or abdominal pain.  GENITOURINARY: No dysuria, hematuria.  ENDOCRINE: No polyuria, nocturia,  HEMATOLOGY: No anemia, easy bruising or bleeding SKIN: No rash or lesion. MUSCULOSKELETAL: No joint pain or arthritis.   NEUROLOGIC: No tingling, numbness, weakness.  PSYCHIATRY: No anxiety or depression.   ROS  DRUG ALLERGIES:   Allergies  Allergen Reactions  . Morphine And Related Shortness Of Breath, Nausea And Vomiting, Rash and Other (See Comments)    Reaction:  Hallucinations   . Penicillins Anaphylaxis, Hives and Other (See Comments)    Has patient had a PCN reaction causing immediate rash, facial/tongue/throat swelling, SOB or lightheadedness with hypotension: Yes Has patient had a PCN reaction causing severe rash involving mucus membranes or skin necrosis: No Has patient had a PCN reaction that required hospitalization No Has patient had a PCN reaction occurring within the last 10 years: No If all of the above answers are "NO", then may proceed with Cephalosporin use.  . Demerol [Meperidine] Other (See Comments)    Reaction:  Hallucinations    . Dilaudid [Hydromorphone Hcl] Other (See Comments)    Reaction:  Hallucinations   . Levofloxacin Other (See Comments)    Reaction:  Unknown     VITALS:  Blood pressure (!) 97/53, pulse (!) 105,  temperature 98 F (36.7 C), temperature source Oral, resp. rate 20, height 6\' 2"  (1.88 m), weight 121.1 kg (267 lb), SpO2 99 %.  PHYSICAL EXAMINATION:   Constitutional: He is oriented to person, place, and time. He appears well-developed and well-nourished. No distress.  HENT:  Head: Normocephalic and atraumatic.  Mouth/Throat: Oropharynx is clear and moist.  Eyes: Conjunctivae and EOM are normal. Pupils are equal, round, and reactive to light. No scleral icterus.  Neck: Normal range of motion. Neck supple. No JVD present. No tracheal deviation present. No thyromegaly present.  Cardiovascular: Normal rate, regular rhythm and normal heart sounds.  Exam reveals no gallop and no friction rub.   No murmur heard. Respiratory: Effort normal and breath sounds normal. No respiratory distress.  GI: Soft. Bowel sounds are normal. He exhibits no distension and no mass. There is tenderness. There is no rebound and no guarding.  Genitourinary:  Genitourinary Comments: Deferred  Musculoskeletal: Normal range of motion. He exhibits no edema.  Lymphadenopathy:    He has no cervical adenopathy.  Neurological: He is alert and oriented to person, place, and time. No cranial nerve deficit.  Skin: Skin is warm and dry. No rash noted. No erythema.  Psychiatric: He has a normal mood and affect. His behavior is normal. Judgment and thought content normal.   Physical Exam LABORATORY PANEL:   CBC  Recent Labs Lab 07/24/16 0448  WBC 18.6*  HGB 9.5*  HCT 29.9*  PLT 250   ------------------------------------------------------------------------------------------------------------------  Chemistries   Recent Labs Lab 07/24/16 0448  NA 134*  K 4.2  CL 94*  CO2 32  GLUCOSE 325*  BUN 26*  CREATININE 1.37*  CALCIUM 7.9*  AST 30  ALT 18  ALKPHOS 130*  BILITOT 0.6   ------------------------------------------------------------------------------------------------------------------  Cardiac  Enzymes  Recent Labs Lab 07/24/16 0448  TROPONINI <0.03   ------------------------------------------------------------------------------------------------------------------  RADIOLOGY:  Dg Chest Port 1 View  Result Date: 07/24/2016 CLINICAL DATA:  Acute onset of generalized weakness and abdominal pain. Nausea. Initial encounter. EXAM: PORTABLE CHEST 1 VIEW COMPARISON:  Chest radiograph performed 02/10/2016 FINDINGS: The lungs are well-aerated. Mild vascular congestion is noted. Retrocardiac opacity may reflect atelectasis or possibly mild infection. There is no evidence of pleural effusion or pneumothorax. The cardiomediastinal silhouette is enlarged. The patient is status post median sternotomy, with evidence of prior CABG. No acute osseous abnormalities are seen. IMPRESSION: Mild vascular congestion and cardiomegaly noted. Retrocardiac opacity may reflect atelectasis or possibly mild infection. Electronically Signed   By: Garald Balding M.D.   On: 07/24/2016 06:13    ASSESSMENT AND PLAN:   Active Problems:   Sepsis ([place])   1. Sepsis: The patient meets criteria via fever and leukocytosis. He has elevated lactic acid as well. Fluid resuscitation   hemodynamically stable. Blood cultures have been obtained and he has received vancomycin. Due to anaphylactic penicillin allergy we will add aztreonam.  Follow blood cultures for growth and sensitivities. 2. Pneumonia: Community-acquired; antibiotics as above. Source may be pneumonia as retrocardiac opacity seen on chest x-ray. 3. Melena: slow GI bleed. 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 I have consulted gastroenterology. Continue IV proton pump inhibitor therapy. 4. Acute on chronic kidney injury: Hydrate with intravenous fluid. Avoid nephrotoxic agents. 5. CHF: Diastolic; chronic. Hold diuretics as septic. 6. COPD: Continue inhalers per home regimen. Currently on 5 L of oxygen via nasal cannula which is slightly  greater than his home oxygen use. 7. Diabetes mellitus type 2: Hold metformin due to acute kidney injury. Continue basal insulin at reduced dose for hospital diet. Sliding scale insulin as well. 8. Hypertension: Controlled; hold metoprolol and lisinopril due to hypotension 9. Depression: Continue Celexa 10. Hyperlipidemia: Continue Lipitor 11. DVT prophylaxis: SCDs 12. GI prophylaxis: As above     All the records are reviewed and case discussed with Care Management/Social Workerr. Management plans discussed with the patient, family and they are in agreement.  CODE STATUS: DNR  TOTAL TIME TAKING CARE OF THIS PATIENT: 35 minutes.   May need PT  POSSIBLE D/C IN 1-2 DAYS, DEPENDING ON CLINICAL CONDITION.   Vaughan Basta M.D on 07/24/2016   Between 7am to 6pm - Pager - [phone]  After 6pm go to [email or web address] - password [email or web address] [facility]  Office  [phone]  CC: Primary care physician; [name], MD  Note: This dictation was prepared with Dragon dictation along with smaller phrase technology. Any transcriptional errors that result from this process are unintentional.

## 2016-07-24 NOTE — ED Triage Notes (Signed)
Per EMS: Pt c/o weakness, abdominal pain X 5 days. Pt c/o black stools. Pt initially denies N/V/D, however began to c/o nausea in route to ED. Pt has hx of COPD, GI bleed, diabetes, hypertension. Pt normally on 5L for COPD. Pt's CBG 376 by EMS
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
## 2016-07-24 NOTE — Care Management (Addendum)
Patient well known to CM.  There is a consult present regarding fact that patient does not have a PCP.  Patient has had a PCP, but has a history of practices declining to continue to see him due to his behaviors.  He does have chronic home 02.  This CM has been involved having [place] patient's medications in a way that would encourage compliance.  not sure if this had continued.  Patient ws last followed by [facility] home health. Not sure if he is currently open to the agency.  GI consult is pending for possible melena.  Patient does have history of significant gi bleeds.  There is no GI coverage for 10.8.2017
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
## 2016-07-25 DIAGNOSIS — K922 Gastrointestinal hemorrhage, unspecified: Secondary | ICD-10-CM

## 2016-07-25 DIAGNOSIS — R1013 Epigastric pain: Secondary | ICD-10-CM

## 2016-07-25 LAB — HEMOGLOBIN A1C
Hgb A1c MFr Bld: 7 % — ABNORMAL HIGH (ref 4.8–5.6)
Mean Plasma Glucose: 154 mg/dL

## 2016-07-25 LAB — GLUCOSE, CAPILLARY
Glucose-Capillary: 176 mg/dL — ABNORMAL HIGH (ref 65–99)
Glucose-Capillary: 244 mg/dL — ABNORMAL HIGH (ref 65–99)
Glucose-Capillary: 368 mg/dL — ABNORMAL HIGH (ref 65–99)
Glucose-Capillary: 317 mg/dL — ABNORMAL HIGH (ref 65–99)

## 2016-07-25 LAB — CBC
HEMATOCRIT: 21.8 % — AB (ref 40.0–52.0)
HEMOGLOBIN: 7.2 g/dL — AB (ref 13.0–18.0)
MCH: 31.9 pg (ref 26.0–34.0)
MCHC: 32.9 g/dL (ref 32.0–36.0)
MCV: 96.7 fL (ref 80.0–100.0)
Platelets: 154 10*3/uL (ref 150–440)
RBC: 2.25 MIL/uL — AB (ref 4.40–5.90)
RDW: 17.5 % — AB (ref 11.5–14.5)
WBC: 8.7 10*3/uL (ref 3.8–10.6)

## 2016-07-25 LAB — URINE CULTURE: Culture: NO GROWTH

## 2016-07-25 LAB — IRON AND TIBC
Iron: 17 ug/dL — ABNORMAL LOW (ref 45–182)
Saturation Ratios: 6 % — ABNORMAL LOW (ref 17.9–39.5)
TIBC: 296 ug/dL (ref 250–450)
UIBC: 279 ug/dL

## 2016-07-25 LAB — PREPARE RBC (CROSSMATCH)

## 2016-07-25 LAB — FERRITIN: Ferritin: 73 ng/mL (ref 24–336)

## 2016-07-25 MED ORDER — SODIUM CHLORIDE 0.9 % IV SOLN
Freq: Once | INTRAVENOUS | Status: AC
  Administered 2016-07-25: 15:00:00 via INTRAVENOUS
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
## 2016-07-25 NOTE — Progress Notes (Signed)
Inpatient Diabetes Program Recommendations  AACE/ADA: New Consensus Statement on Inpatient Glycemic Control (2015)  Target Ranges:  Prepandial:   less than 140 mg/dL      Peak postprandial:   less than 180 mg/dL (1-2 hours)      Critically ill patients:  140 - 180 mg/dL   Results for KAYON, BRONER (MRN [id number]) as of 07/25/2016 13:06  Ref. Range 07/24/2016 08:10 07/24/2016 11:39 07/24/2016 16:48 07/24/2016 21:17  Glucose-Capillary Latest Ref Range: 65 - 99 mg/dL 278 (H) 260 (H) 230 (H) 260 (H)   Results for NAYSHAWN, BUMBALOUGH (MRN [id number]) as of 07/25/2016 13:06  Ref. Range 07/25/2016 07:28 07/25/2016 11:28  Glucose-Capillary Latest Ref Range: 65 - 99 mg/dL 176 (H) 244 (H)    Admit Sepsis/ [place].   History: DM, CHF, COPD  Home DM Meds: Lantus 20 units BID        Novolog 10 units TIDWC        Metformin 500 mg BID  Current Orders: Lantus 14 units QHS       Novolog Moderate Correction Scale/ SSI (0-15 units) TID AC       MD- Please consider the following in-hospital insulin adjustments:  1. Increase Lantus to 20 units QHS (50% home dose)  2. Start low dose Novolog Meal Coverage: Novolog 3 units tid with meals (hold if pt eats <50% of meal)     --Will follow patient during hospitalization--  Wyn Quaker RN, MSN, CDE Diabetes Coordinator Inpatient Glycemic Control Team Team Pager: [phone] (8a-5p)

## 2016-07-25 NOTE — Progress Notes (Signed)
[facility] NAME: [name]    MRN#:  [id number]  DATE OF BIRTH:  12/16/1940  SUBJECTIVE:  Hospital Day: 1 day [name] is a 75 y.o. male presenting with Weakness and Abdominal Pain .   Overnight events: No acute overnight events Interval Events: States breathing has improved, only 1 bowel movement since admission  REVIEW OF SYSTEMS:  CONSTITUTIONAL: No fever, fatigue or weakness.  EYES: No blurred or double vision.  EARS, NOSE, AND THROAT: No tinnitus or ear pain.  RESPIRATORY: No cough, shortness of breath, wheezing or hemoptysis.  CARDIOVASCULAR: No chest pain, orthopnea, edema.  GASTROINTESTINAL: No nausea, vomiting, diarrhea or abdominal pain.  GENITOURINARY: No dysuria, hematuria.  ENDOCRINE: No polyuria, nocturia,  HEMATOLOGY: No anemia, easy bruising or bleeding SKIN: No rash or lesion. MUSCULOSKELETAL: No joint pain or arthritis.   NEUROLOGIC: No tingling, numbness, weakness.  PSYCHIATRY: No anxiety or depression.   DRUG ALLERGIES:   Allergies  Allergen Reactions  . Morphine And Related Shortness Of Breath, Nausea And Vomiting, Rash and Other (See Comments)    Reaction:  Hallucinations   . Penicillins Anaphylaxis, Hives and Other (See Comments)    Has patient had a PCN reaction causing immediate rash, facial/tongue/throat swelling, SOB or lightheadedness with hypotension: Yes Has patient had a PCN reaction causing severe rash involving mucus membranes or skin necrosis: No Has patient had a PCN reaction that required hospitalization No Has patient had a PCN reaction occurring within the last 10 years: No If all of the above answers are "NO", then may proceed with Cephalosporin use.  . Demerol [Meperidine] Other (See Comments)    Reaction:  Hallucinations    . Dilaudid [Hydromorphone Hcl] Other (See Comments)    Reaction:  Hallucinations   . Levofloxacin Other (See Comments)    Reaction:   Unknown     VITALS:  Blood pressure 109/60, pulse 91, temperature 97.7 F (36.5 C), temperature source Oral, resp. rate 20, height 6\' 2"  (1.88 m), weight 126.3 kg (278 lb 8 oz), SpO2 100 %.  PHYSICAL EXAMINATION:  VITAL SIGNS: Vitals:   07/24/16 2026 07/25/16 0458  BP: (!) 103/48 109/60  Pulse: 92 91  Resp: 20 20  Temp: 98.1 F (36.7 C) 97.7 F (36.5 C)   GENERAL:75 y.o.male currently in no acute distress.  HEAD: Normocephalic, atraumatic.  EYES: Pupils equal, round, reactive to light. Extraocular muscles intact. No scleral icterus.  MOUTH: Moist mucosal membrane. Dentition intact. No abscess noted.  EAR, NOSE, THROAT: Clear without exudates. No external lesions.  NECK: Supple. No thyromegaly. No nodules. No JVD.  PULMONARY: Diminished breath sounds in all lung fields without wheeze with scant coarse rhonci. No use of accessory muscles, Good respiratory effort. good air entry bilaterally CHEST: Nontender to palpation.  CARDIOVASCULAR: S1 and S2. Regular rate and rhythm. No murmurs, rubs, or gallops. No edema. Pedal pulses 2+ bilaterally.  GASTROINTESTINAL: Soft, nontender, nondistended. No masses. Positive bowel sounds. No hepatosplenomegaly.  MUSCULOSKELETAL: No swelling, clubbing, or edema. Range of motion full in all extremities.  NEUROLOGIC: Cranial nerves II through XII are intact. No gross focal neurological deficits. Sensation intact. Reflexes intact.  SKIN: No ulceration, lesions, rashes, or cyanosis. Skin warm and dry. Turgor intact.  PSYCHIATRIC: Mood, affect within normal limits. The patient is awake, alert and oriented x 3. Insight, judgment intact.      LABORATORY PANEL:   CBC  Recent Labs Lab 07/25/16 0819  WBC 8.7  HGB  7.2*  HCT 21.8*  PLT 154   ------------------------------------------------------------------------------------------------------------------  Chemistries   Recent Labs Lab 07/24/16 0448  NA 134*  K 4.2  CL 94*  CO2 32  GLUCOSE  325*  BUN 26*  CREATININE 1.37*  CALCIUM 7.9*  AST 30  ALT 18  ALKPHOS 130*  BILITOT 0.6   ------------------------------------------------------------------------------------------------------------------  Cardiac Enzymes  Recent Labs Lab 07/24/16 0448  TROPONINI <0.03   ------------------------------------------------------------------------------------------------------------------  RADIOLOGY:  Dg Chest Port 1 View  Result Date: 07/24/2016 CLINICAL DATA:  Acute onset of generalized weakness and abdominal pain. Nausea. Initial encounter. EXAM: PORTABLE CHEST 1 VIEW COMPARISON:  Chest radiograph performed 02/10/2016 FINDINGS: The lungs are well-aerated. Mild vascular congestion is noted. Retrocardiac opacity may reflect atelectasis or possibly mild infection. There is no evidence of pleural effusion or pneumothorax. The cardiomediastinal silhouette is enlarged. The patient is status post median sternotomy, with evidence of prior CABG. No acute osseous abnormalities are seen. IMPRESSION: Mild vascular congestion and cardiomegaly noted. Retrocardiac opacity may reflect atelectasis or possibly mild infection. Electronically Signed   By: Garald Balding M.D.   On: 07/24/2016 06:13    EKG:   Orders placed or performed during the hospital encounter of 07/24/16  . ED EKG 12-Lead  . ED EKG 12-Lead  . EKG 12-Lead  . EKG 12-Lead    ASSESSMENT AND PLAN:   [name] is a 75 y.o. male presenting with Weakness and Abdominal Pain . Admitted 07/24/2016 : Day #: 1 day 1. Sepsis present on admission secondary to community-acquired pneumonia: Continue antibiotics day 2/5, we will need to ask Levaquin allergy continue oxygen baseline 5 L nasal cannula, saturations greater than 88%  2. Acute on chronic anemia: Drop in hemoglobin dilutional, regardless known history AVM malformation continue Protonix Decrease to twice a day dosing she had consult pending patient does not want any luminal  evaluations for today's discussion can start diet, will transfuse 1 unit packed red blood cells, check iron studies prior to transfusion  3. Acute on chronic kidney injury: Resolved 4. Essential hypertension: Medication currently held follow blood pressure  5. Hyperlipidemia unspecified: Statin therapy    All the records are reviewed and case discussed with Care Management/Social Workerr. Management plans discussed with the patient, family and they are in agreement.  CODE STATUS: dnr TOTAL TIME TAKING CARE OF THIS PATIENT: 33 minutes.   POSSIBLE D/C IN 1-2DAYS, DEPENDING ON CLINICAL CONDITION.   [name],  Karenann Cai.D on 07/25/2016 at 12:41 PM  Between 7am to 6pm - Pager - [phone]  After 6pm: House Pager: - [facility]  Office  [phone]  CC: Primary care physician; [name], MD

## 2016-07-25 NOTE — Progress Notes (Signed)
Patient redirected today because he often speak inappropriately about his private parts. Patient asked to not speak to me or staff inappropriately. About his private parts. Patient started on blood transfusion and report given to [facility]. Patient alert and oriented. No acute distress noted.

## 2016-07-26 LAB — CBC
HCT: 26.7 % — ABNORMAL LOW (ref 40.0–52.0)
Hemoglobin: 8.9 g/dL — ABNORMAL LOW (ref 13.0–18.0)
MCH: 32.1 pg (ref 26.0–34.0)
MCHC: 33.3 g/dL (ref 32.0–36.0)
MCV: 96.5 fL (ref 80.0–100.0)
PLATELETS: 164 10*3/uL (ref 150–440)
RBC: 2.77 MIL/uL — ABNORMAL LOW (ref 4.40–5.90)
RDW: 17.4 % — AB (ref 11.5–14.5)
WBC: 6.6 10*3/uL (ref 3.8–10.6)

## 2016-07-26 LAB — TYPE AND SCREEN
ABO/RH(D): B NEG
ANTIBODY SCREEN: NEGATIVE
UNIT DIVISION: 0

## 2016-07-26 LAB — GLUCOSE, CAPILLARY
Glucose-Capillary: 296 mg/dL — ABNORMAL HIGH (ref 65–99)
Glucose-Capillary: 360 mg/dL — ABNORMAL HIGH (ref 65–99)
Glucose-Capillary: 398 mg/dL — ABNORMAL HIGH (ref 65–99)
Glucose-Capillary: 344 mg/dL — ABNORMAL HIGH (ref 65–99)

## 2016-07-26 LAB — VANCOMYCIN, TROUGH: Vancomycin Tr: 17 ug/mL (ref 15–20)

## 2016-07-26 IMAGING — US US EXTREM LOW VENOUS BILAT
2 series · 13 of 24 positions shown · non-contrast
Comparison: None.

CLINICAL DATA: 74-year-old male with 6 day history of bilateral
lower extremity swelling.



[Series 1: us extrem low venous bilat · 0.11mm/px · 11 of 195 slices shown (1 of 2)]
[im 1/195]
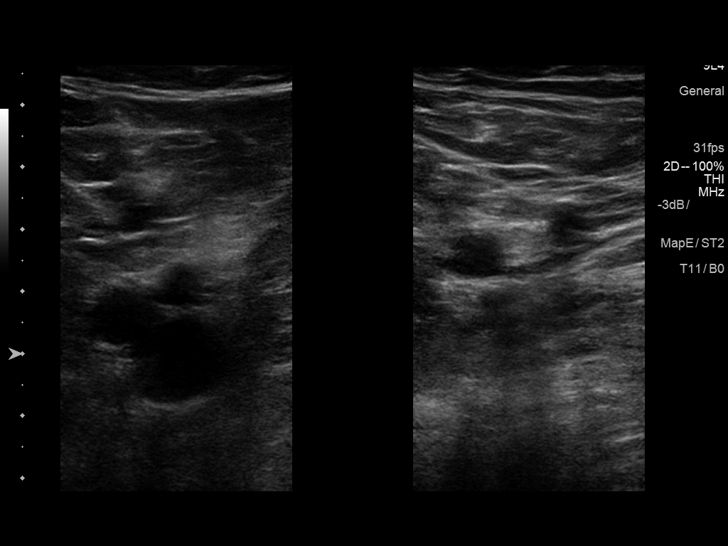
[im 20/195]
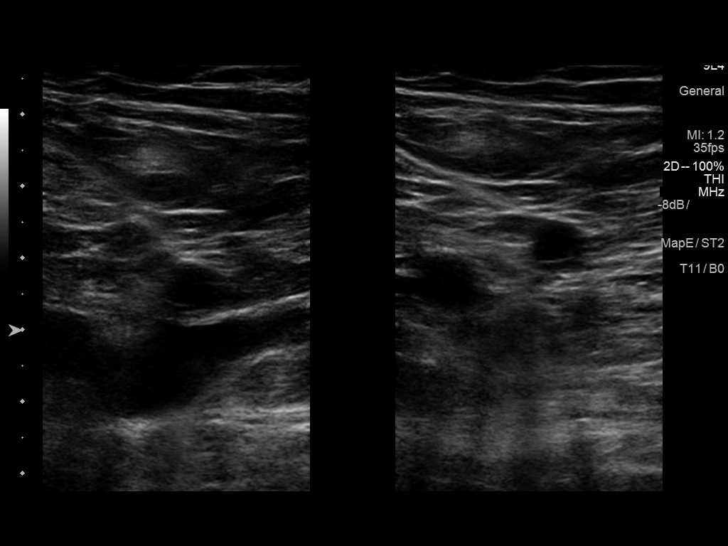
[im 39/195]
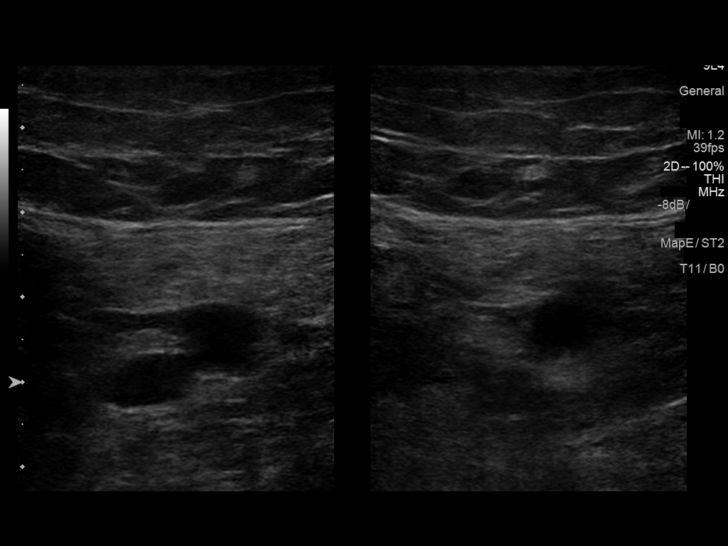
[im 59/195]
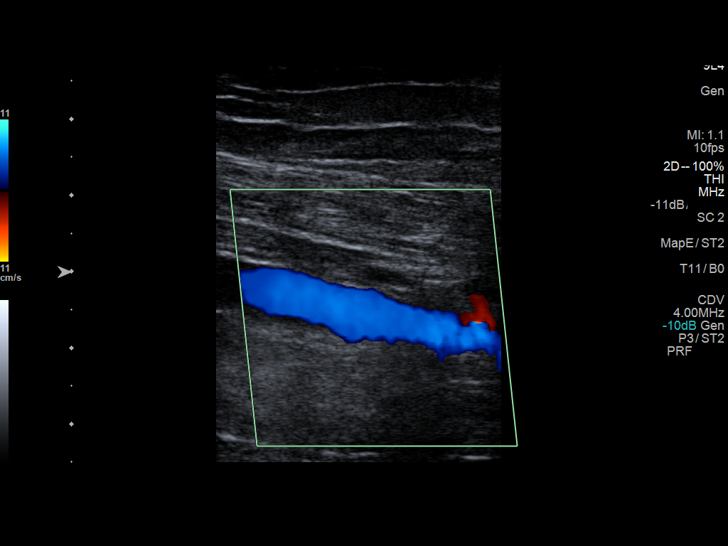
[im 78/195]
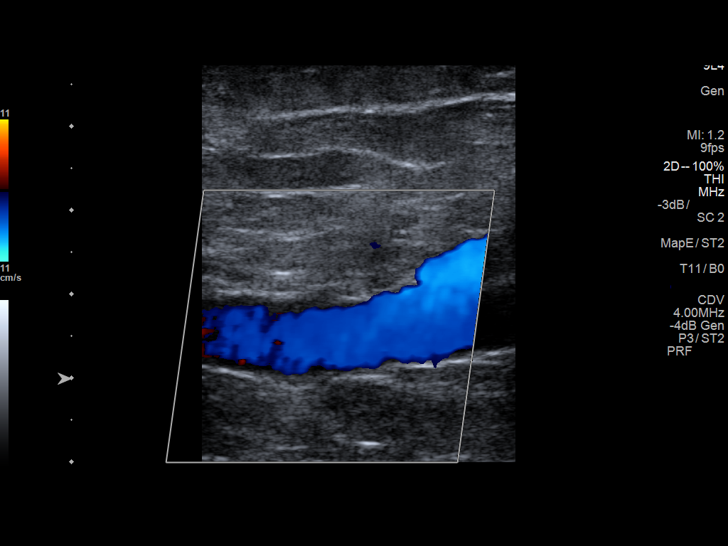
[im 98/195]
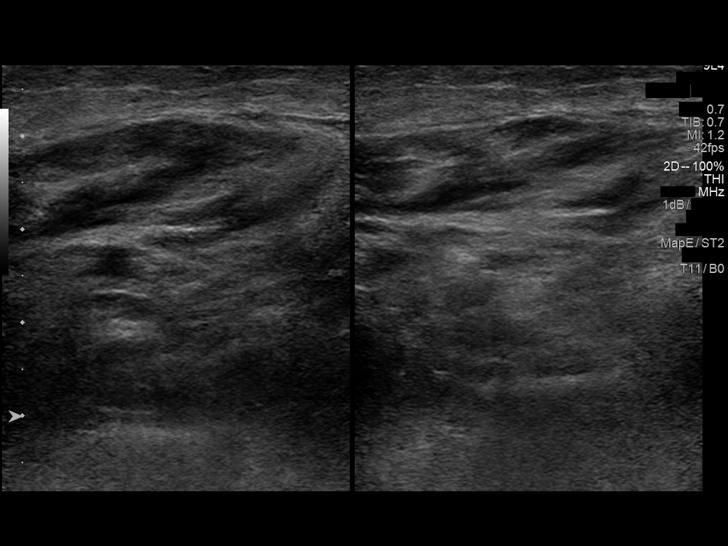
[im 117/195]
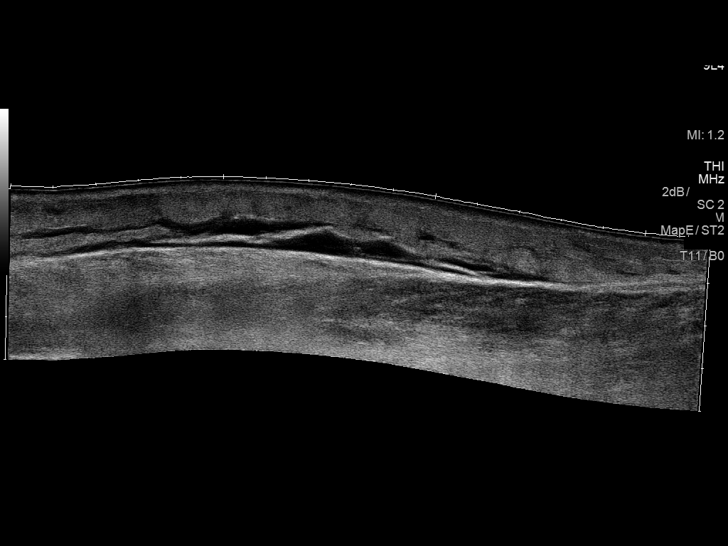
[im 127/195]
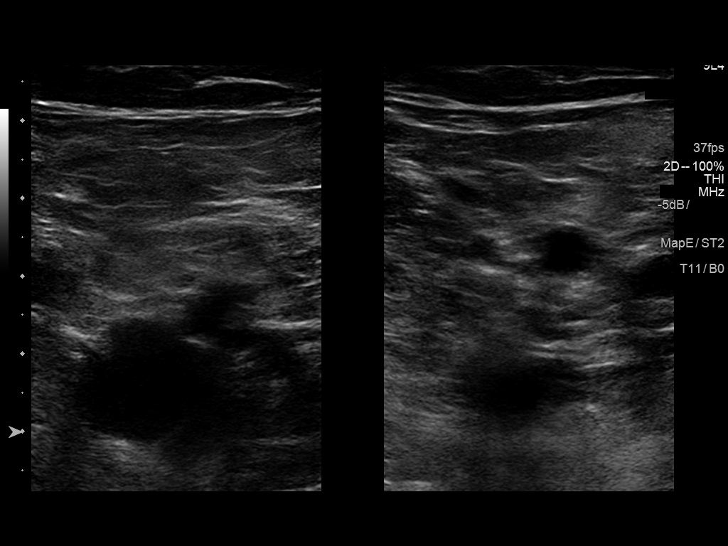
[im 146/195]
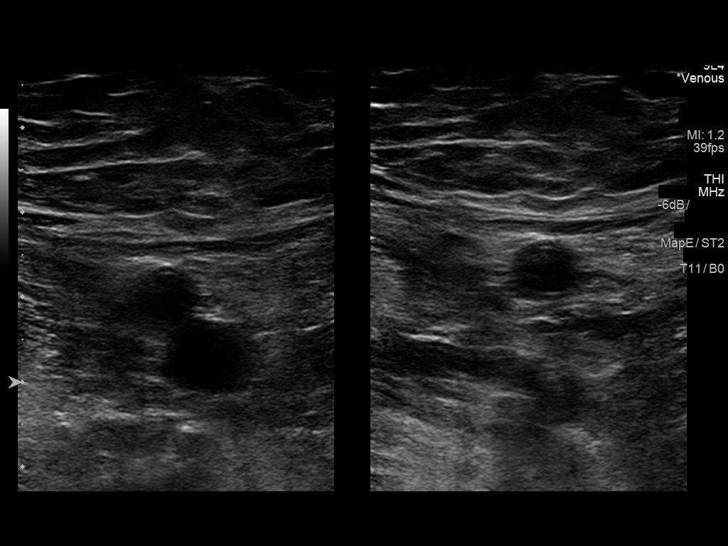
[im 165/195]
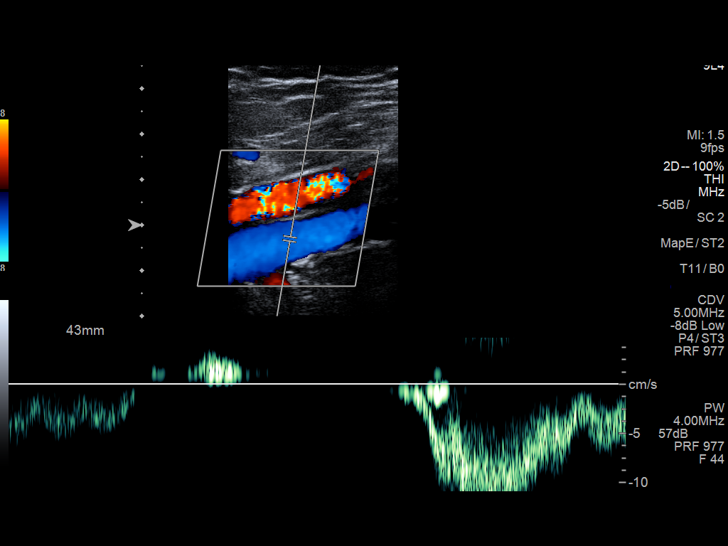
[im 185/195]
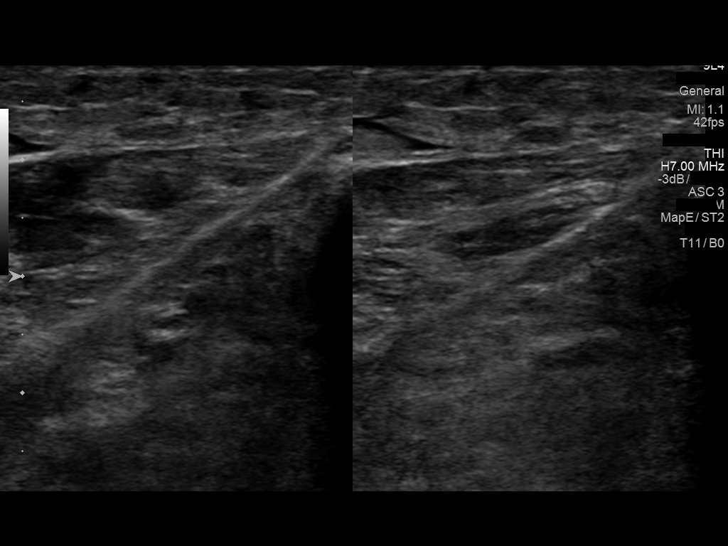

[Series 2: us extrem low venous bilat · 0.08mm/px · 2 of 25 slices shown (2 of 2)]
[im 1/25]
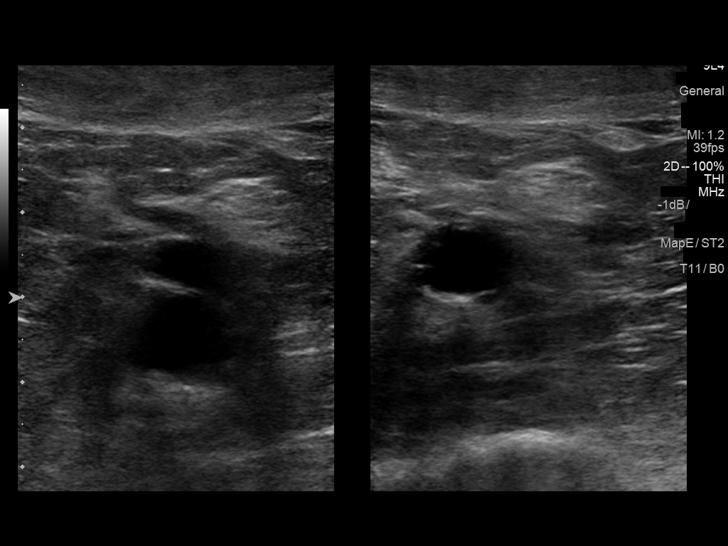
[im 25/25]
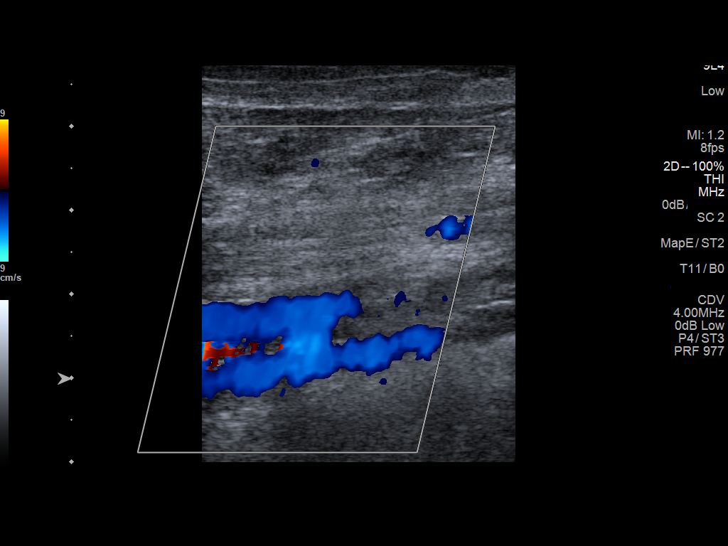

[13 of 24 positions shown; findings below may reference images not displayed]

FINDINGS: RIGHT LOWER EXTREMITY

Common Femoral Vein: No evidence of thrombus. Normal
compressibility, respiratory phasicity and response to augmentation.

Saphenofemoral Junction: No evidence of thrombus. Normal
compressibility and flow on color Doppler imaging.

Profunda Femoral Vein: No evidence of thrombus. Normal
compressibility and flow on color Doppler imaging.

Femoral Vein: No evidence of thrombus. Normal compressibility,
respiratory phasicity and response to augmentation.

Popliteal Vein: No evidence of thrombus. Normal compressibility,
respiratory phasicity and response to augmentation.

Calf Veins: No evidence of thrombus. Normal compressibility and flow
on color Doppler imaging.

Superficial Great Saphenous Vein: No evidence of thrombus. Normal
compressibility and flow on color Doppler imaging.

Venous Reflux:  None.

Other Findings:  Superficial subcutaneous edema throughout the calf.

LEFT LOWER EXTREMITY

Common Femoral Vein: No evidence of thrombus. Normal
compressibility, respiratory phasicity and response to augmentation.

Saphenofemoral Junction: No evidence of thrombus. Normal
compressibility and flow on color Doppler imaging.

Profunda Femoral Vein: No evidence of thrombus. Normal
compressibility and flow on color Doppler imaging.

Femoral Vein: No evidence of thrombus. Normal compressibility,
respiratory phasicity and response to augmentation.

Popliteal Vein: No evidence of thrombus. Normal compressibility,
respiratory phasicity and response to augmentation.

Calf Veins: No evidence of thrombus. Normal compressibility and flow
on color Doppler imaging.

Superficial Great Saphenous Vein: No evidence of thrombus. Normal
compressibility and flow on color Doppler imaging.

Venous Reflux:  None.

Other Findings:  Superficial subcutaneous edema throughout the calf.
IMPRESSION: No evidence of deep venous thrombosis.

## 2016-07-26 MED ORDER — INSULIN ASPART 100 UNIT/ML ~~LOC~~ SOLN
0.0000 [IU] | Freq: Three times a day (TID) | SUBCUTANEOUS | Status: DC
  Administered 2016-07-26 – 2016-07-27 (×2): 11 [IU] via SUBCUTANEOUS
  Administered 2016-07-27: 15 [IU] via SUBCUTANEOUS
  Filled 2016-07-26: qty 11
  Filled 2016-07-26: qty 15
  Filled 2016-07-26: qty 11

## 2016-07-26 MED ORDER — DOXYCYCLINE HYCLATE 100 MG PO TABS
100.0000 mg | ORAL_TABLET | Freq: Two times a day (BID) | ORAL | Status: DC
  Administered 2016-07-26 – 2016-07-27 (×3): 100 mg via ORAL
  Filled 2016-07-26 (×3): qty 1

## 2016-07-26 MED ORDER — CARBAMIDE PEROXIDE 6.5 % OT SOLN
5.0000 [drp] | Freq: Two times a day (BID) | OTIC | Status: DC
  Administered 2016-07-26 – 2016-07-27 (×3): 5 [drp] via OTIC
  Filled 2016-07-26: qty 15

## 2016-07-26 MED ORDER — PANTOPRAZOLE SODIUM 40 MG PO TBEC
40.0000 mg | DELAYED_RELEASE_TABLET | Freq: Two times a day (BID) | ORAL | Status: DC
  Administered 2016-07-26 – 2016-07-27 (×3): 40 mg via ORAL
  Filled 2016-07-26 (×3): qty 1

## 2016-07-26 MED ORDER — INSULIN GLARGINE 100 UNIT/ML ~~LOC~~ SOLN
20.0000 [IU] | Freq: Every day | SUBCUTANEOUS | Status: DC
  Administered 2016-07-26: 20 [IU] via SUBCUTANEOUS
  Filled 2016-07-26 (×2): qty 0.2

## 2016-07-26 MED ORDER — SODIUM CHLORIDE 0.9 % IV SOLN
25.0000 mg | Freq: Once | INTRAVENOUS | Status: AC
  Administered 2016-07-26: 25 mg via INTRAVENOUS
  Filled 2016-07-26: qty 0.5

## 2016-07-26 MED ORDER — INSULIN ASPART 100 UNIT/ML ~~LOC~~ SOLN
5.0000 [IU] | Freq: Three times a day (TID) | SUBCUTANEOUS | Status: DC
Start: 2016-07-26 — End: 2016-07-27
  Administered 2016-07-26 – 2016-07-27 (×3): 5 [IU] via SUBCUTANEOUS
  Filled 2016-07-26 (×3): qty 5

## 2016-07-26 MED ORDER — IRON DEXTRAN 50 MG/ML IJ SOLN
1000.0000 mg | Freq: Once | INTRAMUSCULAR | Status: AC
  Administered 2016-07-26: 1000 mg via INTRAVENOUS
  Filled 2016-07-26: qty 20

## 2016-07-26 NOTE — Progress Notes (Signed)
SVN given by RT for c/o wheezing/SOB; refused to be pulled up in bed to assist with respiratory improvement; congested, non-productive cough. Barbaraann Faster, RN [phone] AM10/07/2016

## 2016-07-26 NOTE — Care Management (Signed)
Patient admitted from home with Sepsis present on admission secondary to community-acquired pneumonia.  Patient is on [facility] home O2 from [facility].  Patient has portable tanks at home, and will need to bring one for discharge.  Patient lives at home with his wife. PCP TEJAN-SIE.  Patient was recently discharged from [facility] home health services. [name] from [facility] is aware of admission, and states they are able to accept the patient back should services be indicated at discharge.  RNCM following for discharge planning.

## 2016-07-26 NOTE — Progress Notes (Addendum)
Inpatient Diabetes Program Recommendations  AACE/ADA: New Consensus Statement on Inpatient Glycemic Control (2015)  Target Ranges:  Prepandial:   less than 140 mg/dL      Peak postprandial:   less than 180 mg/dL (1-2 hours)      Critically ill patients:  140 - 180 mg/dL   Results for TRACY, PASLEY (MRN [id number]) as of 07/26/2016 09:05  Ref. Range 07/25/2016 07:28 07/25/2016 11:28 07/25/2016 16:28 07/25/2016 21:24  Glucose-Capillary Latest Ref Range: 65 - 99 mg/dL 176 (H) 244 (H) 368 (H) 317 (H)   Results for MIKA, RAJARAM (MRN [id number]) as of 07/26/2016 09:05  Ref. Range 07/26/2016 07:25  Glucose-Capillary Latest Ref Range: 65 - 99 mg/dL 296 (H)    Home DM Meds: Lantus 20 units BID                              Novolog 10 units TIDWC                              Metformin 500 mg BID  Current Orders: Lantus 20 units QHS                             Novolog Moderate Correction Scale/ SSI (0-15 units) TID AC      Note Lantus increased to 20 units QHS for tonight  MD- Please also consider the following in-hospital insulin adjustments:  Start low dose Novolog Meal Coverage: Novolog 5 units tid with meals (hold if pt eats <50% of meal)     --Will follow patient during hospitalization--  Wyn Quaker RN, MSN, CDE Diabetes Coordinator Inpatient Glycemic Control Team Team Pager: [phone] (8a-5p)

## 2016-07-26 NOTE — Progress Notes (Signed)
[facility] [place] NAME: [name]    MRN#:  [id number]  DATE OF BIRTH:  [birth year]  SUBJECTIVE:  Hospital Day: 2 days [name] is a 75 y.o. male presenting with Weakness and Abdominal Pain .   Overnight events: No acute overnight events Interval Events: States breathing has improved  REVIEW OF SYSTEMS:  CONSTITUTIONAL: No fever, fatigue or weakness.  EYES: No blurred or double vision.  EARS, NOSE, AND THROAT: No tinnitus or ear pain.  RESPIRATORY: No cough, shortness of breath, wheezing or hemoptysis.  CARDIOVASCULAR: No chest pain, orthopnea, edema.  GASTROINTESTINAL: No nausea, vomiting, diarrhea or abdominal pain.  GENITOURINARY: No dysuria, hematuria.  ENDOCRINE: No polyuria, nocturia,  HEMATOLOGY: No anemia, easy bruising or bleeding SKIN: No rash or lesion. MUSCULOSKELETAL: No joint pain or arthritis.   NEUROLOGIC: No tingling, numbness, weakness.  PSYCHIATRY: No anxiety or depression.   DRUG ALLERGIES:   Allergies  Allergen Reactions  . Morphine And Related Shortness Of Breath, Nausea And Vomiting, Rash and Other (See Comments)    Reaction:  Hallucinations   . Penicillins Anaphylaxis, Hives and Other (See Comments)    Has patient had a PCN reaction causing immediate rash, facial/tongue/throat swelling, SOB or lightheadedness with hypotension: Yes Has patient had a PCN reaction causing severe rash involving mucus membranes or skin necrosis: No Has patient had a PCN reaction that required hospitalization No Has patient had a PCN reaction occurring within the last 10 years: No If all of the above answers are "NO", then may proceed with Cephalosporin use.  . Demerol [Meperidine] Other (See Comments)    Reaction:  Hallucinations    . Dilaudid [Hydromorphone Hcl] Other (See Comments)    Reaction:  Hallucinations   . Levofloxacin Other (See Comments)    Reaction:  Unknown     VITALS:  Blood pressure (!)  148/87, pulse 100, temperature 97.5 F (36.4 C), temperature source Oral, resp. rate 20, height 6\' 2"  (1.88 m), weight 128.4 kg (283 lb), SpO2 93 %.  PHYSICAL EXAMINATION:  VITAL SIGNS: Vitals:   07/26/16 0458 07/26/16 1146  BP: 131/60 (!) 148/87  Pulse: 91 100  Resp: 20   Temp: 97.5 F (36.4 C)    GENERAL:75 y.o.male currently in no acute distress.  HEAD: Normocephalic, atraumatic.  EYES: Pupils equal, round, reactive to light. Extraocular muscles intact. No scleral icterus.  MOUTH: Moist mucosal membrane. Dentition intact. No abscess noted.  EAR, NOSE, THROAT: Clear without exudates. No external lesions.  NECK: Supple. No thyromegaly. No nodules. No JVD.  PULMONARY: Diminished breath sounds in all lung fields without wheeze with scant coarse rhonci. No use of accessory muscles, Good respiratory effort. good air entry bilaterally CHEST: Nontender to palpation.  CARDIOVASCULAR: S1 and S2. Regular rate and rhythm. No murmurs, rubs, or gallops. No edema. Pedal pulses 2+ bilaterally.  GASTROINTESTINAL: Soft, nontender, nondistended. No masses. Positive bowel sounds. No hepatosplenomegaly.  MUSCULOSKELETAL: No swelling, clubbing, or edema. Range of motion full in all extremities.  NEUROLOGIC: Cranial nerves II through XII are intact. No gross focal neurological deficits. Sensation intact. Reflexes intact.  SKIN: No ulceration, lesions, rashes, or cyanosis. Skin warm and dry. Turgor intact.  PSYCHIATRIC: Mood, affect within normal limits. The patient is awake, alert and oriented x 3. Insight, judgment intact.      LABORATORY PANEL:   CBC  Recent Labs Lab 07/26/16 0853  WBC 6.6  HGB 8.9*  HCT 26.7*  PLT 164   ------------------------------------------------------------------------------------------------------------------  Chemistries   Recent Labs Lab 07/24/16 0448  NA 134*  K 4.2  CL 94*  CO2 32  GLUCOSE 325*  BUN 26*  CREATININE 1.37*  CALCIUM 7.9*  AST 30  ALT  18  ALKPHOS 130*  BILITOT 0.6   ------------------------------------------------------------------------------------------------------------------  Cardiac Enzymes  Recent Labs Lab 07/24/16 0448  TROPONINI <0.03   ------------------------------------------------------------------------------------------------------------------  RADIOLOGY:  No results found.  EKG:   Orders placed or performed during the hospital encounter of 07/24/16  . ED EKG 12-Lead  . ED EKG 12-Lead  . EKG 12-Lead  . EKG 12-Lead    ASSESSMENT AND PLAN:   [name] is a 75 y.o. male presenting with Weakness and Abdominal Pain . Admitted 07/24/2016 : Day #: 2 days 1. Sepsis present on admission secondary to community-acquired pneumonia: Continue antibiotics day 3/5, Okay for downgrade antibiotics-doxycycline, continue oxygen baseline 5 L nasal cannula, saturations greater than 88%  2. Acute on chronic anemia: GI input appreciated, oral PPI, iron deficiency anemia iron level of 17 IV iron infusion  3. Acute on chronic kidney injury: Resolved 4. Essential hypertension: Medication currently held follow blood pressure  5. Hyperlipidemia unspecified: Statin therapy    All the records are reviewed and case discussed with Care Management/Social Workerr. Management plans discussed with the patient, family and they are in agreement.  CODE STATUS: dnr TOTAL TIME TAKING CARE OF THIS PATIENT: 33 minutes.   POSSIBLE D/C IN 1-2DAYS, DEPENDING ON CLINICAL CONDITION.   Hower,  Karenann Cai.D on 07/26/2016 at 2:10 PM  Between 7am to 6pm - Pager - [phone]  After 6pm: House Pager: - [facility]  Office  [phone]  CC: Primary care physician; [name], MD

## 2016-07-26 NOTE — Progress Notes (Signed)
Pharmacy Antibiotic Note  [name] is a 75 y.o. male admitted on 07/24/2016 with sepsis.  Pharmacy has been consulted for vancomycin and aztreonam dosing.  Plan: Vancomycin trough=17 Continue current dose of vancomycin 1250mg  q 12 hours  Height: 6\' 2"  (188 cm) Weight: 278 lb 8 oz (126.3 kg) IBW/kg (Calculated) : 82.2  Temp (24hrs), Avg:98 F (36.7 C), Min:97.5 F (36.4 C), Max:98.2 F (36.8 C)   Recent Labs Lab 07/24/16 0448 07/24/16 0532 07/24/16 0854 07/25/16 0819 07/26/16 0039  WBC 18.6*  --   --  8.7  --   CREATININE 1.37*  --   --   --   --   LATICACIDVEN  --  3.3* 2.2*  --   --   VANCOTROUGH  --   --   --   --  17    Estimated Creatinine Clearance: 66.8 mL/min (by C-G formula based on SCr of 1.37 mg/dL (H)).    Allergies  Allergen Reactions  . Morphine And Related Shortness Of Breath, Nausea And Vomiting, Rash and Other (See Comments)    Reaction:  Hallucinations   . Penicillins Anaphylaxis, Hives and Other (See Comments)    Has patient had a PCN reaction causing immediate rash, facial/tongue/throat swelling, SOB or lightheadedness with hypotension: Yes Has patient had a PCN reaction causing severe rash involving mucus membranes or skin necrosis: No Has patient had a PCN reaction that required hospitalization No Has patient had a PCN reaction occurring within the last 10 years: No If all of the above answers are "NO", then may proceed with Cephalosporin use.  . Demerol [Meperidine] Other (See Comments)    Reaction:  Hallucinations    . Dilaudid [Hydromorphone Hcl] Other (See Comments)    Reaction:  Hallucinations   . Levofloxacin Other (See Comments)    Reaction:  Unknown     Antimicrobials this admission: vancomycin  >>  aztreonam  >>   Dose adjustments this admission:   Microbiology results: 10/8 BCx: staph species 1/2 sets 10/8 UCx: NG    10/8 UA: (-)  10/8 CXR: atelectasis vs. Infection  Thank you for allowing pharmacy to be a part of  this patient's care.  [name] 07/26/2016 1:47 AM

## 2016-07-26 NOTE — Evaluation (Signed)
Physical Therapy Evaluation Patient Details Name: [name] MRN: [id number] DOB: December 29, 1940 Today's Date: 07/26/2016   History of Present Illness  Pt. is a 75 y.o. male presenting with abdominal pain, black stools, and SOB admitted for sepsis secondary to pneumonia. Pt. has Hx: COPD, GI bleed, DM, HTN, uses 5L O2 at baseline   Clinical Impression  Pt. Supine in bed upon arrival, alert and oriented. Pt. Demonstrates grossly 5/5 strength throughout with isolated testing however, demonstrates decreased functional strength and endurance throughout session. Pt. Able to perform bed mobility mod I, requires min A for sit<>stand transfers with use of RW pt. Requires multiple attempts to fully transition to standing. Pt. Able to ambulate 154ft. Min A with use of RW and chair follow demonstrating slow cadence and lateral sway, able to perform step through gait pattern and no evidence of buckling. Pt. States "i feel wobbly" Unsafe to attempt ambulation without AD (pt's baseline level ) or further mobility during today's session. Pt. Fatigued following basic mobility activities, vitals monitored during session: prior to activity HR 108 spO2 98 on 5L O2, following ambulation HR 116, spO2 93% on 5LO2. Would benefit from skilled PT to address above deficits and promote optimal return to PLOF Recommend SNF placement upon d/c to follow up with further skilled PT needs.    Follow Up Recommendations SNF    Equipment Recommendations       Recommendations for Other Services       Precautions / Restrictions Precautions Precautions: Fall Restrictions Weight Bearing Restrictions: No      Mobility  Bed Mobility Overal bed mobility: Modified Independent             General bed mobility comments: Pt. able to perform LE and trunk movements with assist of BUE from bedrails, HOB elevated   Transfers Overall transfer level: Needs assistance Equipment used: Rolling walker (2 wheeled) Transfers: Sit  to/from Stand Sit to Stand: Min assist         General transfer comment: Pt. requires verbal cues to perform sit<>stand without pulling up on RW with B UE's, pt. required multiple attempts in order to achieve full standing.   Ambulation/Gait Ambulation/Gait assistance: Min assist Ambulation Distance (Feet): 120 Feet Assistive device: Rolling walker (2 wheeled)       General Gait Details: pt. demonstrates slowed cadence, increased lateral sway and reliance on RW for UE support. Pt. demonstrates even step length and step through pattern, no evidence of buckling. unsafe to attempt without AD.   Stairs Stairs:  (unsafe to attempt )          Wheelchair Mobility    Modified Rankin (Stroke Patients Only)       Balance Overall balance assessment: Needs assistance Sitting-balance support: Feet supported;Bilateral upper extremity supported Sitting balance-Leahy Scale: Good     Standing balance support: Bilateral upper extremity supported Standing balance-Leahy Scale: Fair Standing balance comment: Pt. able to perform static standing without B UE support with CGA, requires BUE support for standing dynamic activyt                             Pertinent Vitals/Pain Pain Assessment: No/denies pain    Home Living Family/patient expects to be discharged to:: Private residence Living Arrangements: Spouse/significant other Available Help at Discharge: Family Type of Home: House Home Access: Stairs to enter Entrance Stairs-Rails: Can reach both Entrance Stairs-Number of Steps: 6 Home Layout: One level Home Equipment: Environmental consultant - 2  wheels;Cane - single point;Bedside commode Additional Comments: Pt. reports he does not use AD at baseline, does use BSC at baseline     Prior Function Level of Independence: Independent         Comments: Pt. reports he takes sponge baths, wife prepares meals, reports falling 2-3x in the past couple months.      Hand Dominance         Extremity/Trunk Assessment   Upper Extremity Assessment: Overall WFL for tasks assessed (B UE grossly 5/5 strength, sensation/coordination intact)           Lower Extremity Assessment: Generalized weakness (Pt. tests symmetrically and grossly 5/5 for B LE although demonstrates decreased functional LE strength requiring multiple attempts to stand from both EOB and chair)         Communication   Communication: No difficulties  Cognition Arousal/Alertness: Awake/alert Behavior During Therapy: WFL for tasks assessed/performed;Impulsive Overall Cognitive Status: Within Functional Limits for tasks assessed                      General Comments      Exercises Other Exercises Other Exercises: Pt. requested to use the urinal x2 during session, pt. performed sit<>stand from EOB and recliner chair with min A, requiring multiple attempts to achieve full standing with use of UE pushing from seating surface. pt. able to demonstrate static standing balance without UE support CGA.    Assessment/Plan    PT Assessment Patient needs continued PT services  PT Problem List            PT Treatment Interventions Gait training;Functional mobility training;Therapeutic activities;DME instruction;Stair training;Therapeutic exercise;Neuromuscular re-education;Balance training;Patient/family education    PT Goals (Current goals can be found in the Care Plan section)  Acute Rehab PT Goals Patient Stated Goal: Pt. would like to return home PT Goal Formulation: With patient Time For Goal Achievement: 08/09/16 Potential to Achieve Goals: Fair    Frequency Min 2X/week   Barriers to discharge Inaccessible home environment      Co-evaluation               End of Session Equipment Utilized During Treatment: Gait belt;Oxygen Activity Tolerance: Patient tolerated treatment well;Patient limited by fatigue (Pt. reports mobility during session is far more than he has done in  weeks) Patient left: in chair;with call bell/phone within reach;with chair alarm set Nurse Communication: Mobility status         Time: [id number] PT Time Calculation (min) (ACUTE ONLY): 36 min   Charges:         PT G Codes:        Melanie Crazier, SPT  07/26/16,4:03 PM

## 2016-07-27 LAB — CULTURE, BLOOD (ROUTINE X 2)

## 2016-07-27 LAB — CBC
HCT: 25.9 % — ABNORMAL LOW (ref 40.0–52.0)
Hemoglobin: 8.6 g/dL — ABNORMAL LOW (ref 13.0–18.0)
MCH: 31.9 pg (ref 26.0–34.0)
MCHC: 33.1 g/dL (ref 32.0–36.0)
MCV: 96.4 fL (ref 80.0–100.0)
PLATELETS: 181 10*3/uL (ref 150–440)
RBC: 2.68 MIL/uL — ABNORMAL LOW (ref 4.40–5.90)
RDW: 17.4 % — ABNORMAL HIGH (ref 11.5–14.5)
WBC: 5.3 10*3/uL (ref 3.8–10.6)

## 2016-07-27 LAB — GLUCOSE, CAPILLARY
Glucose-Capillary: 319 mg/dL — ABNORMAL HIGH (ref 65–99)
Glucose-Capillary: 383 mg/dL — ABNORMAL HIGH (ref 65–99)

## 2016-07-27 MED ORDER — DOXYCYCLINE MONOHYDRATE 100 MG PO TABS: 100.0000 mg | ORAL_TABLET | Freq: Two times a day (BID) | ORAL | 0 refills | Status: AC

## 2016-07-27 MED ORDER — ZOLPIDEM TARTRATE 5 MG PO TABS: 5.0000 mg | ORAL_TABLET | Freq: Every evening | ORAL | 0 refills | Status: DC | PRN

## 2016-07-27 MED ORDER — CARBAMIDE PEROXIDE 6.5 % OT SOLN: 5.0000 [drp] | Freq: Two times a day (BID) | OTIC | 0 refills | Status: DC

## 2016-07-27 NOTE — Progress Notes (Signed)
Physical Therapy Treatment Patient Details Name: [name] MRN: [id number] DOB: [birth year] Today's Date: 07/27/2016    History of Present Illness Pt. is a 75 y.o. male presenting with abdominal pain, black stools, and SOB admitted for sepsis secondary to pneumonia. Pt. has Hx: COPD, GI bleed, DM, HTN, uses 5L O2 at baseline     PT Comments    Pt. Supine in bed upon arrival, motivated to participate in activity. Pt. Demonstrated improved independence and progression of mobility during today's session. He was able to perform bed mobility mod I, and multiple sit<>stand transfers CGA with use of RW demonstrating ability to achieve standing on first attempt at transfer. Pt. Able to ambulate 228ft. CGA with use of RW demonstrating slowed cadence but even step length/step through pattern, he required 2 standing rest breaks for SOB, able to recover quickly. He was also able to participate in multiple LE strengthening exercises. SpO2 monitored throughout 98% resting on 5L nasal cannula, decreased to 88% with activity/ambulation on 5L nasal cannula but recovered quickly with rest. Would benefit from skilled PT to address above deficits and promote optimal return to PLOF Continue to recommend SNF placement upon d/c to follow up with addition skilled PT needs.   Follow Up Recommendations  SNF     Equipment Recommendations       Recommendations for Other Services       Precautions / Restrictions Precautions Precautions: Fall Restrictions Weight Bearing Restrictions: No    Mobility  Bed Mobility Overal bed mobility: Modified Independent             General bed mobility comments: Pt. able to perform LE and trunk movements with assist of BUE from bedrails, HOB elevated   Transfers Overall transfer level: Needs assistance Equipment used: Rolling walker (2 wheeled) Transfers: Sit to/from Stand Sit to Stand: Min guard         General transfer comment: Pt. demonstrates improved  LE functional strength ability to stand on first attempt with all sit<>stand transfers today.   Ambulation/Gait Ambulation/Gait assistance: Min guard Ambulation Distance (Feet): 220 Feet Assistive device: Rolling walker (2 wheeled)       General Gait Details: Pt. demonstrates slowed cadence, even step length/step through pattern, no evidence of buckling, requires standing rest breaks x2 for SOB   Stairs            Wheelchair Mobility    Modified Rankin (Stroke Patients Only)       Balance Overall balance assessment: Needs assistance Sitting-balance support: Feet supported Sitting balance-Leahy Scale: Good     Standing balance support: Bilateral upper extremity supported Standing balance-Leahy Scale: Good Standing balance comment: Requires B UE support for mobility in standing                    Cognition Arousal/Alertness: Awake/alert Behavior During Therapy: WFL for tasks assessed/performed;Impulsive Overall Cognitive Status: Within Functional Limits for tasks assessed                      Exercises Other Exercises Other Exercises: seated LE exercises LAQ Bx20, ankle pumpsx10B, marching x15B. standing mini squats x10 with BUE support from RW supervision A    General Comments        Pertinent Vitals/Pain Pain Assessment: No/denies pain    Home Living                      Prior Function  PT Goals (current goals can now be found in the care plan section) Acute Rehab PT Goals Patient Stated Goal: Pt. would like to return home PT Goal Formulation: With patient Time For Goal Achievement: 08/09/16 Potential to Achieve Goals: Fair Progress towards PT goals: Progressing toward goals    Frequency    Min 2X/week      PT Plan Current plan remains appropriate    Co-evaluation             End of Session Equipment Utilized During Treatment: Gait belt;Oxygen Activity Tolerance: Patient tolerated treatment well  (Pt. requires intermittent breaks for SOB, able to recover quickly. Activity performed today is much more than his previous baseline) Patient left: in chair;with call bell/phone within reach;with chair alarm set     Time: 0929-1001 PT Time Calculation (min) (ACUTE ONLY): 32 min  Charges:                       G Codes:      Melanie Crazier, SPT  August 11, 2016,12:09 PM

## 2016-07-27 NOTE — Discharge Summary (Signed)
[name] at [place] NAME: Howard [name]    MR#:  [id number]  DATE OF BIRTH:  October 16, 1941  DATE OF ADMISSION:  07/24/2016 ADMITTING PHYSICIAN: Harrie Foreman, MD  DATE OF DISCHARGE: 07/27/16  PRIMARY CARE PHYSICIAN: [name], MD    ADMISSION DIAGNOSIS:  Epigastric pain [R10.13] Upper GI bleed [K92.2] Generalized weakness [R53.1] Sepsis, due to unspecified organism ([place]) [A41.9] Fever, unspecified fever cause [R50.9] Leukocytosis, unspecified type [D72.829]  DISCHARGE DIAGNOSIS:  Sepsis present on admission - resolved Community acquired pneumonia Iron def anemia   SECONDARY DIAGNOSIS:   Past Medical History:  Diagnosis Date  . AAA (abdominal aortic aneurysm) ([place])    a. 12/2008 s/p 7cm, endovascular repair with coiling right hypogastric artery   . Allergic rhinitis, cause unspecified   . Anxiety state 09/10/2013  . AVM (arteriovenous malformation) of colon with hemorrhage   . Bipolar 1 disorder, mixed, moderate ([place]) 04/16/2015  . CAD (coronary artery disease)    a. 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  . Chronic diastolic CHF (congestive heart failure) ([place])    a. 04/2015 Echo: EF 55-60%, no rwma, Gr 1 DD, mild AI.  Marland Kitchen COPD (chronic obstructive pulmonary disease) ([place])    a. GOLD stage IV, started home O2. Severe bullous disease of LUL. Prolonged intubation after surgeries due to COPD.  Marland Kitchen Depression 01/14/2013  . Diverticulosis   . Essential hypertension   . Essential hypertension 08/18/2009   Qualifier: Diagnosis of  By: Howard Mince LPN, Megan    . GI bleed requiring more than 4 units of blood in 24 hours, ICU, or surgery    a. Hx bleeding gastric polyps, cecal & sigmoid AVMS s/p APC 03/30/14  . Hyperlipidemia   . Insomnia 08/10/2014  . Leucocytosis 12/04/2013  . Memory loss   . Morbid obesity ([place])   . Multiple gastric polyps   . Recurrent Microcytic Anemia    a. presumed  chronic GI blood loss.  . Type II diabetes mellitus ([place])   . Vitamin D deficiency 08/10/2014    HOSPITAL COURSE:  Howard [name]  is a 75 y.o. male admitted 07/24/2016 with chief complaint Weakness and Abdominal Pain . Please see H&P performed by Harrie Foreman, MD for further information. Patient presented with the above symptoms including shortness of breath and having melena. Found to have evidence of pneumonia, meeting septic criteria on admission. As well as anemic with low iron levels. Started on protonix with resolution of melena, evaluated by GI - no luminal study perfromed as this is a chronic issue and risk outweigh benefit. He did receive 1 unit PRBC transfusion as well as IV iron loading. For the pneumonia, breathing has improved back to baseline.  DISCHARGE CONDITIONS:   stable  CONSULTS OBTAINED:  Treatment Team:  [name], MD  DRUG ALLERGIES:   Allergies  Allergen Reactions  . Morphine And Related Shortness Of Breath, Nausea And Vomiting, Rash and Other (See Comments)    Reaction:  Hallucinations   . Penicillins Anaphylaxis, Hives and Other (See Comments)    Has patient had a PCN reaction causing immediate rash, facial/tongue/throat swelling, SOB or lightheadedness with hypotension: Yes Has patient had a PCN reaction causing severe rash involving mucus membranes or skin necrosis: No Has patient had a PCN reaction that required hospitalization No Has patient had a PCN reaction occurring within the last 10 years: No If all of the above answers are "NO", then  may proceed with Cephalosporin use.  . Demerol [Meperidine] Other (See Comments)    Reaction:  Hallucinations    . Dilaudid [Hydromorphone Hcl] Other (See Comments)    Reaction:  Hallucinations   . Levofloxacin Other (See Comments)    Reaction:  Unknown     DISCHARGE MEDICATIONS:   Current Discharge Medication List    START taking these medications   Details  carbamide peroxide (DEBROX) 6.5 % otic  solution Place 5 drops into both ears 2 (two) times daily. Qty: 15 mL, Refills: 0    doxycycline (ADOXA) 100 MG tablet Take 1 tablet (100 mg total) by mouth 2 (two) times daily. Qty: 8 tablet, Refills: 0      CONTINUE these medications which have NOT CHANGED   Details  acetaminophen (TYLENOL) 325 MG tablet Take 650 mg by mouth every 6 (six) hours as needed for mild pain, fever or headache.     albuterol (PROVENTIL HFA;VENTOLIN HFA) 108 (90 BASE) MCG/ACT inhaler Inhale 2 puffs into the lungs every 6 (six) hours as needed for wheezing or shortness of breath. Qty: 1 Inhaler, Refills: 2    ALPRAZolam (XANAX) 0.25 MG tablet Take 0.5 mg by mouth 3 (three) times daily as needed for anxiety or sleep.    atorvastatin (LIPITOR) 40 MG tablet Take 40 mg by mouth at bedtime.    budesonide-formoterol (SYMBICORT) 160-4.5 MCG/ACT inhaler Inhale 2 puffs into the lungs 2 (two) times daily. Qty: 1 Inhaler, Refills: 5    cholecalciferol (VITAMIN D) 1000 UNITS tablet Take 1,000 Units by mouth daily.    citalopram (CELEXA) 40 MG tablet Take 40 mg by mouth daily.    ferrous sulfate 325 (65 FE) MG tablet Take 325 mg by mouth 3 (three) times daily with meals.     gabapentin (NEURONTIN) 300 MG capsule Take 300 mg by mouth 3 (three) times daily.    insulin aspart (NOVOLOG) 100 UNIT/ML injection Inject 10 Units into the skin 3 (three) times daily before meals.    insulin glargine (LANTUS) 100 UNIT/ML injection Inject 20 Units into the skin 2 (two) times daily.    lisinopril (PRINIVIL,ZESTRIL) 2.5 MG tablet Take 2.5 mg by mouth daily.    metFORMIN (GLUCOPHAGE) 500 MG tablet Take 500 mg by mouth 2 (two) times daily with a meal.    metoprolol tartrate (LOPRESSOR) 25 MG tablet Take 25 mg by mouth 2 (two) times daily.    pantoprazole (PROTONIX) 40 MG tablet Take 40 mg by mouth daily.    tiotropium (SPIRIVA) 18 MCG inhalation capsule Place 18 mcg into inhaler and inhale daily.    torsemide (DEMADEX) 20 MG  tablet Take 40 mg by mouth daily.    traMADol-acetaminophen (ULTRACET) 37.5-325 MG tablet Take 1 tablet by mouth 2 (two) times daily as needed for moderate pain.    zolpidem (AMBIEN) 5 MG tablet Take 1 tablet (5 mg total) by mouth at bedtime as needed for sleep. Qty: 30 tablet, Refills: 0    lidocaine (XYLOCAINE) 5 % ointment Apply 1 application topically as needed for mild pain.    sucralfate (CARAFATE) 1 G tablet Take 1 g by mouth 4 (four) times daily -  with meals and at bedtime.    Umeclidinium Bromide (INCRUSE ELLIPTA) 62.5 MCG/INH AEPB Inhale 1 puff into the lungs daily. Qty: 1 each, Refills: 0      STOP taking these medications     HYDROcodone-acetaminophen (NORCO/VICODIN) 5-325 MG tablet          DISCHARGE INSTRUCTIONS:  DIET:  Diabetic diet  DISCHARGE CONDITION:  Stable  ACTIVITY:  Activity as tolerated  OXYGEN:  Home Oxygen: Yes.     Oxygen Delivery: 5 liters/min via Patient connected to nasal cannula oxygen  DISCHARGE LOCATION:  home   If you experience worsening of your admission symptoms, develop shortness of breath, life threatening emergency, suicidal or homicidal thoughts you must seek medical attention immediately by calling 911 or calling your MD immediately  if symptoms less severe.  You Must read complete instructions/literature along with all the possible adverse reactions/side effects for all the Medicines you take and that have been prescribed to you. Take any new Medicines after you have completely understood and accpet all the possible adverse reactions/side effects.   Please note  You were cared for by a hospitalist during your hospital stay. If you have any questions about your discharge medications or the care you received while you were in the hospital after you are discharged, you can call the unit and asked to speak with the hospitalist on call if the hospitalist that took care of you is not available. Once you are discharged, your  primary care physician will handle any further medical issues. Please note that NO REFILLS for any discharge medications will be authorized once you are discharged, as it is imperative that you return to your primary care physician (or establish a relationship with a primary care physician if you do not have one) for your aftercare needs so that they can reassess your need for medications and monitor your lab values.    On the day of Discharge:   VITAL SIGNS:  Blood pressure 137/76, pulse (!) 101, temperature 97.4 F (36.3 C), temperature source Oral, resp. rate 20, height 6\' 2"  (1.88 m), weight 128.4 kg (283 lb), SpO2 98 %.  I/O:   Intake/Output Summary (Last 24 hours) at 07/27/16 1014 Last data filed at 07/27/16 0556  Gross per 24 hour  Intake              600 ml  Output             2625 ml  Net            -2025 ml    PHYSICAL EXAMINATION:  GENERAL:  75 y.o.-year-old patient lying in the bed with no acute distress.  EYES: Pupils equal, round, reactive to light and accommodation. No scleral icterus. Extraocular muscles intact.  HEENT: Head atraumatic, normocephalic. Oropharynx and nasopharynx clear.  NECK:  Supple, no jugular venous distention. No thyroid enlargement, no tenderness.  LUNGS: Normal breath sounds bilaterally, no wheezing, rales, scant coarse rhonchi. No use of accessory muscles of respiration.  CARDIOVASCULAR: S1, S2 normal. No murmurs, rubs, or gallops.  ABDOMEN: Soft, non-tender, non-distended. Bowel sounds present. No organomegaly or mass.  EXTREMITIES: No pedal edema, cyanosis, or clubbing.  NEUROLOGIC: Cranial nerves II through XII are intact. Muscle strength 5/5 in all extremities. Sensation intact. Gait not checked.  PSYCHIATRIC: The patient is alert and oriented x 3.  SKIN: No obvious rash, lesion, or ulcer.   DATA REVIEW:   CBC  Recent Labs Lab 07/27/16 0758  WBC 5.3  HGB 8.6*  HCT 25.9*  PLT 181    Chemistries   Recent Labs Lab  07/24/16 0448  NA 134*  K 4.2  CL 94*  CO2 32  GLUCOSE 325*  BUN 26*  CREATININE 1.37*  CALCIUM 7.9*  AST 30  ALT 18  ALKPHOS 130*  BILITOT 0.6  Cardiac Enzymes  Recent Labs Lab 07/24/16 0448  TROPONINI <0.03    Microbiology Results  Results for orders placed or performed during the hospital encounter of 07/24/16  Blood Culture (routine x 2)     Status: Abnormal   Collection Time: 07/24/16  5:30 AM  Result Value Ref Range Status   Specimen Description BLOOD RIGHT HAND  Final   Special Requests BOTTLES DRAWN AEROBIC AND ANAEROBIC 10CC  Final   Culture  Setup Time   Final    Organism ID to follow IN BOTH AEROBIC AND ANAEROBIC BOTTLES GRAM POSITIVE COCCI CRITICAL RESULT CALLED TO, READ BACK BY AND VERIFIED WITH: MATT MCBANE AT 2355 ON 07/24/16 RWW CONFIRMED BY [facility]    Culture (A)  Final    VIRIDANS STREPTOCOCCUS THE SIGNIFICANCE OF ISOLATING THIS ORGANISM FROM A SINGLE SET OF BLOOD CULTURES WHEN MULTIPLE SETS ARE DRAWN IS UNCERTAIN. PLEASE NOTIFY THE MICROBIOLOGY DEPARTMENT WITHIN ONE WEEK IF SPECIATION AND SENSITIVITIES ARE REQUIRED. Performed at [facility]    Report Status 07/27/2016 FINAL  Final  Blood Culture ID Panel (Reflexed)     Status: Abnormal   Collection Time: 07/24/16  5:30 AM  Result Value Ref Range Status   Enterococcus species NOT DETECTED NOT DETECTED Final   Listeria monocytogenes NOT DETECTED NOT DETECTED Final   Staphylococcus species NOT DETECTED NOT DETECTED Final   Staphylococcus aureus NOT DETECTED NOT DETECTED Final   Streptococcus species DETECTED (A) NOT DETECTED Final    Comment: CRITICAL RESULT CALLED TO, READ BACK BY AND VERIFIED WITH: MATT MCBANE AT 2355 ON 07/24/16 RWW    Streptococcus agalactiae NOT DETECTED NOT DETECTED Final   Streptococcus pneumoniae NOT DETECTED NOT DETECTED Final   Streptococcus pyogenes NOT DETECTED NOT DETECTED Final   Acinetobacter baumannii NOT DETECTED NOT DETECTED Final   Enterobacteriaceae  species NOT DETECTED NOT DETECTED Final   Enterobacter cloacae complex NOT DETECTED NOT DETECTED Final   Escherichia coli NOT DETECTED NOT DETECTED Final   Klebsiella oxytoca NOT DETECTED NOT DETECTED Final   Klebsiella pneumoniae NOT DETECTED NOT DETECTED Final   Proteus species NOT DETECTED NOT DETECTED Final   Serratia marcescens NOT DETECTED NOT DETECTED Final   Haemophilus influenzae NOT DETECTED NOT DETECTED Final   Neisseria meningitidis NOT DETECTED NOT DETECTED Final   Pseudomonas aeruginosa NOT DETECTED NOT DETECTED Final   Candida albicans NOT DETECTED NOT DETECTED Final   Candida glabrata NOT DETECTED NOT DETECTED Final   Candida krusei NOT DETECTED NOT DETECTED Final   Candida parapsilosis NOT DETECTED NOT DETECTED Final   Candida tropicalis NOT DETECTED NOT DETECTED Final  Blood Culture (routine x 2)     Status: None (Preliminary result)   Collection Time: 07/24/16  5:35 AM  Result Value Ref Range Status   Specimen Description BLOOD LEFT FOREARM  Final   Special Requests BOTTLES DRAWN AEROBIC AND ANAEROBIC 5CC  Final   Culture NO GROWTH 3 DAYS  Final   Report Status PENDING  Incomplete  Urine culture     Status: None   Collection Time: 07/24/16  6:27 AM  Result Value Ref Range Status   Specimen Description URINE, RANDOM  Final   Special Requests NONE  Final   Culture NO GROWTH Performed at [facility]   Final   Report Status 07/25/2016 FINAL  Final    RADIOLOGY:  No results found.   Management plans discussed with the patient, family and they are in agreement.  CODE STATUS:  Code Status Orders        Start     Ordered   07/24/16 0806  Do not attempt resuscitation (DNR)  Continuous    Question Answer Comment  In the event of cardiac or respiratory ARREST Do not call a "code blue"   In the event of cardiac or respiratory ARREST Do not perform Intubation, CPR, defibrillation or ACLS   In the event of cardiac or respiratory ARREST Use medication  by any route, position, wound care, and other measures to relive pain and suffering. May use oxygen, suction and manual treatment of airway obstruction as needed for comfort.      07/24/16 0806    Code Status History    Date Active Date Inactive Code Status Order ID Comments User Context   02/10/2016  3:28 PM 02/12/2016  8:09 PM DNR [id number]  Loletha Grayer, MD ED   10/26/2015  3:14 AM 11/01/2015  8:05 PM Full Code [id number]  Saundra Shelling, MD Inpatient   08/20/2015  4:41 AM 08/22/2015  4:49 PM Full Code [id number]  Lytle Butte, MD ED   08/20/2015  2:54 AM 08/20/2015  4:41 AM Full Code [id number]  Lytle Butte, MD ED   07/27/2015  8:40 PM 07/29/2015  6:29 PM Full Code [id number]  Lytle Butte, MD ED   06/30/2015 12:40 PM 07/03/2015  3:26 PM Full Code [id number]  Bettey Costa, MD Inpatient   05/19/2015  2:59 PM 05/23/2015  4:21 PM Full Code [id number]  Max Sane, MD Inpatient   05/06/2015  5:14 PM 05/13/2015  5:06 PM Full Code [id number]  Epifanio Lesches, MD ED   05/03/2015 12:29 PM 05/04/2015  7:20 PM Full Code [id number]  Idelle Crouch, MD Inpatient   04/22/2015  4:50 AM 04/27/2015  7:35 PM Full Code [id number]  Harrie Foreman, MD Inpatient   04/17/2015  7:16 PM 04/19/2015  5:16 PM Full Code [id number]  Clovis Fredrickson, MD Inpatient   04/10/2015  9:12 AM 04/17/2015  7:08 PM Full Code [id number]  Demetrios Loll, MD Inpatient   03/09/2015 12:18 PM 03/12/2015  6:45 PM Full Code [id number]  Henreitta Leber, MD Inpatient   08/24/2014  9:41 PM 08/25/2014  5:20 PM Full Code [id number]  Ivor Costa, MD ED   04/02/2014  8:11 PM 04/04/2014  5:10 PM Full Code [id number]  Velvet Bathe, MD Inpatient   03/18/2014  3:44 PM 03/22/2014  2:06 PM Full Code [id number]  Verlee Monte, MD Inpatient   12/04/2013  6:13 AM 12/10/2013  7:54 PM Full Code [id number]  Rise Patience, MD Inpatient   10/15/2013  8:11 PM 10/18/2013  6:40 PM Full Code [id number]  Fuller Plan, MD Inpatient   09/08/2013 12:43 AM 09/10/2013  3:30 PM Full Code [id number]  Toy Baker, MD Inpatient   08/19/2013  5:48 PM 08/22/2013  3:51 PM Full Code [id number]  Orson Eva, MD ED   05/15/2013  1:26 AM 05/17/2013  4:59 PM Full Code [id number]  Theodis Blaze, MD Inpatient   12/04/2012  5:23 PM 12/08/2012  4:56 PM Full Code [id number]  Janece Canterbury, MD Inpatient   05/17/2012 12:56 PM 05/21/2012  6:09 PM Full Code [id number]  Campbell Lerner, RN ED   04/06/2012  7:35 PM 04/14/2012  5:23 PM Full Code [id number]  Marylou Mccoy, RN Inpatient   03/27/2012  4:44 AM 03/28/2012  5:56 PM Full Code [id number]  Theotis Barrio, RN Inpatient    Advance Directive Documentation  Flowsheet Row Most Recent Value  Type of Advance Directive  Healthcare Power of Attorney  Pre-existing out of facility DNR order (yellow form or pink MOST form)  No data  "MOST" Form in Place?  No data      TOTAL TIME TAKING CARE OF THIS PATIENT: 33 minutes.    Symphanie Cederberg,  Karenann Cai.D on 07/27/2016 at 10:14 AM  Between 7am to 6pm - Pager - [phone]  After 6pm go to [email or web address] - Proofreader  [facility]  Office  [phone]  CC: Primary care physician; [name], MD

## 2016-07-27 NOTE — Care Management Important Message (Signed)
Important Message  Patient Details  Name: [name] MRN: [id number] Date of Birth: [birth year]   Medicare Important Message Given:  Yes    Beverly Sessions, RN 07/27/2016, 11:02 AM

## 2016-07-27 NOTE — Care Management (Signed)
Patient to discharge home today.  Home health order placed.  [name] from [place] notified of discharge.  I have left a message for patient's wife at his request.  Awaiting return call.  She will need to bring his portable tank for discharge.

## 2016-07-27 NOTE — Progress Notes (Signed)
07/27/2016  2:45 PM  [name] to be D/C'd Home per MD order.  Discussed prescriptions and follow up appointments with the patient. Prescriptions given to patient, medication list explained in detail. Pt verbalized understanding.    Medication List    STOP taking these medications   HYDROcodone-acetaminophen 5-325 MG tablet Commonly known as:  NORCO/VICODIN     TAKE these medications   acetaminophen 325 MG tablet Commonly known as:  TYLENOL Take 650 mg by mouth every 6 (six) hours as needed for mild pain, fever or headache.   albuterol 108 (90 Base) MCG/ACT inhaler Commonly known as:  PROVENTIL HFA;VENTOLIN HFA Inhale 2 puffs into the lungs every 6 (six) hours as needed for wheezing or shortness of breath.   ALPRAZolam 0.25 MG tablet Commonly known as:  XANAX Take 0.5 mg by mouth 3 (three) times daily as needed for anxiety or sleep.   atorvastatin 40 MG tablet Commonly known as:  LIPITOR Take 40 mg by mouth at bedtime.   budesonide-formoterol 160-4.5 MCG/ACT inhaler Commonly known as:  SYMBICORT Inhale 2 puffs into the lungs 2 (two) times daily.   carbamide peroxide 6.5 % otic solution Commonly known as:  DEBROX Place 5 drops into both ears 2 (two) times daily.   cholecalciferol 1000 units tablet Commonly known as:  VITAMIN D Take 1,000 Units by mouth daily.   citalopram 40 MG tablet Commonly known as:  CELEXA Take 40 mg by mouth daily.   doxycycline 100 MG tablet Commonly known as:  ADOXA Take 1 tablet (100 mg total) by mouth 2 (two) times daily.   ferrous sulfate 325 (65 FE) MG tablet Take 325 mg by mouth 3 (three) times daily with meals.   gabapentin 300 MG capsule Commonly known as:  NEURONTIN Take 300 mg by mouth 3 (three) times daily.   insulin aspart 100 UNIT/ML injection Commonly known as:  novoLOG Inject 10 Units into the skin 3 (three) times daily before meals.   insulin glargine 100 UNIT/ML injection Commonly known as:  LANTUS Inject  20 Units into the skin 2 (two) times daily.   lidocaine 5 % ointment Commonly known as:  XYLOCAINE Apply 1 application topically as needed for mild pain.   lisinopril 2.5 MG tablet Commonly known as:  PRINIVIL,ZESTRIL Take 2.5 mg by mouth daily.   metFORMIN 500 MG tablet Commonly known as:  GLUCOPHAGE Take 500 mg by mouth 2 (two) times daily with a meal.   metoprolol tartrate 25 MG tablet Commonly known as:  LOPRESSOR Take 25 mg by mouth 2 (two) times daily.   pantoprazole 40 MG tablet Commonly known as:  PROTONIX Take 40 mg by mouth daily.   sucralfate 1 g tablet Commonly known as:  CARAFATE Take 1 g by mouth 4 (four) times daily -  with meals and at bedtime.   tiotropium 18 MCG inhalation capsule Commonly known as:  SPIRIVA Place 18 mcg into inhaler and inhale daily.   torsemide 20 MG tablet Commonly known as:  DEMADEX Take 40 mg by mouth daily.   traMADol-acetaminophen 37.5-325 MG tablet Commonly known as:  ULTRACET Take 1 tablet by mouth 2 (two) times daily as needed for moderate pain.   umeclidinium bromide 62.5 MCG/INH Aepb Commonly known as:  INCRUSE ELLIPTA Inhale 1 puff into the lungs daily.   zolpidem 5 MG tablet Commonly known as:  AMBIEN Take 1 tablet (5 mg total) by mouth at bedtime as needed for sleep. What changed:  Another medication with the  same name was added. Make sure you understand how and when to take each.   zolpidem 5 MG tablet Commonly known as:  AMBIEN Take 1 tablet (5 mg total) by mouth at bedtime as needed for sleep. What changed:  You were already taking a medication with the same name, and this prescription was added. Make sure you understand how and when to take each.       Vitals:   07/26/16 2051 07/27/16 0443  BP: (!) 123/47 137/76  Pulse: 92 (!) 101  Resp: 16 20  Temp: 98 F (36.7 C) 97.4 F (36.3 C)    Skin clean, dry and intact without evidence of skin break down, no evidence of skin tears noted. IV catheter  discontinued intact. Site without signs and symptoms of complications. Dressing and pressure applied. Pt denies pain at this time. No complaints noted.  An After Visit Summary was printed and given to the patient. Patient escorted via [place], and D/C home via private auto.  [name]

## 2016-07-27 NOTE — Clinical Social Work Note (Signed)
CSW noted consult for STR as recommended by PT. Please refer to RN CM documentation. Patient has declined STR and PT is aware as well. Shela Leff MSW,LCSW [phone]

## 2016-07-29 LAB — CULTURE, BLOOD (ROUTINE X 2): CULTURE: NO GROWTH

## 2016-08-02 ENCOUNTER — Telehealth: Payer: Self-pay | Admitting: Pulmonary Disease

## 2016-08-02 ENCOUNTER — Ambulatory Visit (INDEPENDENT_AMBULATORY_CARE_PROVIDER_SITE_OTHER): Payer: Commercial Managed Care - HMO | Admitting: Gastroenterology

## 2016-08-02 ENCOUNTER — Other Ambulatory Visit: Payer: Self-pay

## 2016-08-02 ENCOUNTER — Encounter: Payer: Self-pay | Admitting: Gastroenterology

## 2016-08-02 VITALS — BP 112/67 | HR 83 | Temp 98.1°F | Ht 74.5 in | Wt 267.0 lb

## 2016-08-02 DIAGNOSIS — D62 Acute posthemorrhagic anemia: Secondary | ICD-10-CM | POA: Diagnosis not present

## 2016-08-02 DIAGNOSIS — D509 Iron deficiency anemia, unspecified: Secondary | ICD-10-CM

## 2016-08-02 MED ORDER — DOXYCYCLINE HYCLATE 100 MG PO TABS: 100.0000 mg | ORAL_TABLET | Freq: Two times a day (BID) | ORAL | 0 refills | Status: DC

## 2016-08-02 MED ORDER — TORSEMIDE 20 MG PO TABS: 20.0000 mg | ORAL_TABLET | Freq: Two times a day (BID) | ORAL | 1 refills | Status: DC

## 2016-08-02 MED ORDER — INSULIN GLARGINE 100 UNIT/ML ~~LOC~~ SOLN: 20.0000 [IU] | Freq: Two times a day (BID) | SUBCUTANEOUS | 1 refills | Status: DC

## 2016-08-02 NOTE — Telephone Encounter (Signed)
Per SN: Doxy 100mg  #20, take 1 po BID Be sure follow up is scheduled with BQ in 2-3 wks.

## 2016-08-02 NOTE — Telephone Encounter (Signed)
LM for pt x 1  

## 2016-08-02 NOTE — Progress Notes (Signed)
Primary Care Physician: [name], MD  Primary Gastroenterologist:  Dr. [name]  Chief Complaint  Patient presents with  . Hospitalization Follow-up  . Anemia    HPI: [name] is a 75 y.o. male here For follow-up after being discharged from the hospital. The patient has a history of being discharged from multiple medical practices and multiple gastroenterology practices. The patient has a history of anemia. The patient was recently in the hospital for pneumonia and now comes for follow-up. The patient states he is still weak but has not had any rectal bleeding or black stools. He reports that he is out of multiple medications and has not been able to get in with her primary care provider.  Current Outpatient Prescriptions  Medication Sig Dispense Refill  . acetaminophen (TYLENOL) 325 MG tablet Take 650 mg by mouth every 6 (six) hours as needed for mild pain, fever or headache.     . albuterol (PROVENTIL HFA;VENTOLIN HFA) 108 (90 BASE) MCG/ACT inhaler Inhale 2 puffs into the lungs every 6 (six) hours as needed for wheezing or shortness of breath. 1 Inhaler 2  . ALPRAZolam (XANAX) 0.25 MG tablet Take 0.5 mg by mouth 3 (three) times daily as needed for anxiety or sleep.    Marland Kitchen atorvastatin (LIPITOR) 40 MG tablet Take 40 mg by mouth at bedtime.    . budesonide-formoterol (SYMBICORT) 160-4.5 MCG/ACT inhaler Inhale 2 puffs into the lungs 2 (two) times daily. 1 Inhaler 5  . carbamide peroxide (DEBROX) 6.5 % otic solution Place 5 drops into both ears 2 (two) times daily. 15 mL 0  . cholecalciferol (VITAMIN D) 1000 UNITS tablet Take 1,000 Units by mouth daily.    . citalopram (CELEXA) 40 MG tablet Take 40 mg by mouth daily.    Marland Kitchen doxycycline (VIBRA-TABS) 100 MG tablet Take 1 tablet (100 mg total) by mouth 2 (two) times daily. 20 tablet 0  . ferrous sulfate 325 (65 FE) MG tablet Take 325 mg by mouth 3 (three) times daily with meals.     . gabapentin (NEURONTIN) 300 MG capsule  Take 300 mg by mouth 3 (three) times daily.    . insulin aspart (NOVOLOG) 100 UNIT/ML injection Inject 10 Units into the skin 3 (three) times daily before meals.    . insulin glargine (LANTUS) 100 UNIT/ML injection Inject 20 Units into the skin 2 (two) times daily.    Marland Kitchen lidocaine (XYLOCAINE) 5 % ointment Apply 1 application topically as needed for mild pain.    Marland Kitchen lisinopril (PRINIVIL,ZESTRIL) 2.5 MG tablet Take 2.5 mg by mouth daily.    . metFORMIN (GLUCOPHAGE) 500 MG tablet Take 500 mg by mouth 2 (two) times daily with a meal.    . metoprolol tartrate (LOPRESSOR) 25 MG tablet Take 25 mg by mouth 2 (two) times daily.    . pantoprazole (PROTONIX) 40 MG tablet Take 40 mg by mouth daily.    . sucralfate (CARAFATE) 1 G tablet Take 1 g by mouth 4 (four) times daily -  with meals and at bedtime.    Marland Kitchen tiotropium (SPIRIVA) 18 MCG inhalation capsule Place 18 mcg into inhaler and inhale daily.    Marland Kitchen torsemide (DEMADEX) 20 MG tablet Take 40 mg by mouth daily.    . traMADol-acetaminophen (ULTRACET) 37.5-325 MG tablet Take 1 tablet by mouth 2 (two) times daily as needed for moderate pain.    Marland Kitchen Umeclidinium Bromide (INCRUSE ELLIPTA) 62.5 MCG/INH AEPB Inhale 1 puff into the lungs daily. 1 each  0  . zolpidem (AMBIEN) 5 MG tablet Take 1 tablet (5 mg total) by mouth at bedtime as needed for sleep. 30 tablet 0  . zolpidem (AMBIEN) 5 MG tablet Take 1 tablet (5 mg total) by mouth at bedtime as needed for sleep. 30 tablet 0   No current facility-administered medications for this visit.     Allergies as of 08/02/2016 - Review Complete 08/02/2016  Allergen Reaction Noted  . Morphine and related Shortness Of Breath, Nausea And Vomiting, Rash, and Other (See Comments) 04/06/2012  . Penicillins Anaphylaxis, Hives, and Other (See Comments)   . Demerol [meperidine] Other (See Comments) 04/06/2012  . Dilaudid [hydromorphone hcl] Other (See Comments) 12/04/2013  . Levofloxacin Other (See Comments) 10/06/2015     ROS:  General: Negative for anorexia, weight loss, fever, chills, fatigue, weakness. ENT: Negative for hoarseness, difficulty swallowing , nasal congestion. CV: Negative for chest pain, angina, palpitations, dyspnea on exertion, peripheral edema.  Respiratory: Negative for dyspnea at rest, dyspnea on exertion, cough, sputum, wheezing.  GI: See history of present illness. GU:  Negative for dysuria, hematuria, urinary incontinence, urinary frequency, nocturnal urination.  Endo: Negative for unusual weight change.    Physical Examination:   BP 112/67   Pulse 83   Temp 98.1 F (36.7 C) (Oral)   Ht 6' 2.5" (1.892 m)   Wt 267 lb (121.1 kg)   BMI 33.82 kg/m   General: Well-nourished, well-developed in no acute distress.  Eyes: No icterus. Conjunctivae pink. Neuro: Alert and oriented x 3.  Grossly intact. Skin: Warm and dry, no jaundice.   Psych: Alert and cooperative, normal mood and affect.  Labs:    Imaging Studies: Dg Chest Port 1 View  Result Date: 07/24/2016 CLINICAL DATA:  Acute onset of generalized weakness and abdominal pain. Nausea. Initial encounter. EXAM: PORTABLE CHEST 1 VIEW COMPARISON:  Chest radiograph performed 02/10/2016 FINDINGS: The lungs are well-aerated. Mild vascular congestion is noted. Retrocardiac opacity may reflect atelectasis or possibly mild infection. There is no evidence of pleural effusion or pneumothorax. The cardiomediastinal silhouette is enlarged. The patient is status post median sternotomy, with evidence of prior CABG. No acute osseous abnormalities are seen. IMPRESSION: Mild vascular congestion and cardiomegaly noted. Retrocardiac opacity may reflect atelectasis or possibly mild infection. Electronically Signed   By: Garald Balding M.D.   On: 07/24/2016 06:13    Assessment and Plan:   [name] is a 75 y.o. y/o male with multiple medical problems who is unable to get a visit with a primary care physician. The patient will have his  insulin and diuretic refilled for him by me for one month's time. The patient has been encouraged to find a PCP. The patient will also have his labs checked in one week to see if his hemoglobin is stable. The patient has been explained the plan and agrees with it.   Note: This dictation was prepared with Dragon dictation along with smaller phrase technology. Any transcriptional errors that result from this process are unintentional.

## 2016-08-02 NOTE — Telephone Encounter (Signed)
Spoke with patient. Doxy sent to pharmacy.  Pt requested that I just make him an appointment for any day M-F between the hours of 11am and 4pm and mail him the appt reminder. Pt will check with wife to see if this appt will work. Appt scheduled for first available with BQ on 08/30/16 at 3pm -- printed and mailed to the patient.  Nothing further needed.

## 2016-08-02 NOTE — Telephone Encounter (Signed)
Spoke with pt. He was diagnosed with PNA in the hospital. They gave him doxy. He is not feeling any better and feels that he needs another round of doxy. He states that BQ has had to do this for him in the past.  Allergies  Allergen Reactions  . Morphine And Related Shortness Of Breath, Nausea And Vomiting, Rash and Other (See Comments)    Reaction:  Hallucinations   . Penicillins Anaphylaxis, Hives and Other (See Comments)    Has patient had a PCN reaction causing immediate rash, facial/tongue/throat swelling, SOB or lightheadedness with hypotension: Yes Has patient had a PCN reaction causing severe rash involving mucus membranes or skin necrosis: No Has patient had a PCN reaction that required hospitalization No Has patient had a PCN reaction occurring within the last 10 years: No If all of the above answers are "NO", then may proceed with Cephalosporin use.  . Demerol [Meperidine] Other (See Comments)    Reaction:  Hallucinations    . Dilaudid [Hydromorphone Hcl] Other (See Comments)    Reaction:  Hallucinations   . Levofloxacin Other (See Comments)    Reaction:  Unknown     SN - please advise. Thanks.

## 2016-08-02 NOTE — Telephone Encounter (Signed)
Patient called back - pr  °

## 2016-08-05 ENCOUNTER — Telehealth: Payer: Self-pay | Admitting: Pulmonary Disease

## 2016-08-05 NOTE — Telephone Encounter (Signed)
BQ- please advise if you are okay with [place] okaying PT 2x a week for 6 weeks Thanks!

## 2016-08-08 NOTE — Telephone Encounter (Signed)
BQ please advise. thanks 

## 2016-08-08 NOTE — Telephone Encounter (Signed)
Yes, OK by me 

## 2016-08-08 NOTE — Telephone Encounter (Signed)
Spoke with Mickel Baas. She is aware of BQ's response. Nothing further was needed.

## 2016-08-30 ENCOUNTER — Ambulatory Visit: Payer: Medicare HMO | Admitting: Pulmonary Disease

## 2016-09-02 ENCOUNTER — Telehealth: Payer: Self-pay | Admitting: Pulmonary Disease

## 2016-09-02 NOTE — Telephone Encounter (Signed)
LM for Mickel Baas with [employer] . Will await call back.

## 2016-09-02 NOTE — Telephone Encounter (Signed)
Mickel Baas called back and she was given the VO to cont the PT 2 times per week x 3 weeks.  Will forward to BQ to make him aware.

## 2016-09-05 NOTE — Telephone Encounter (Signed)
Not sure what they need here. I signed paperwork on this last week. Has this not been faxed back?

## 2016-09-13 IMAGING — DX DG CHEST 1V PORT
1 series · 2 of 2 positions shown · non-contrast
Comparison: 10/30/2015 and 10/25/2015 and CT scan of the chest
dated 07/01/2015

CLINICAL DATA: Shortness of breath. Left upper quadrant abdominal
pain.

EXAM:
PORTABLE CHEST 1 VIEW

[Series 1: chest ap · 0.14mm/px · 2 of 2 slices shown]
[im 1/2]
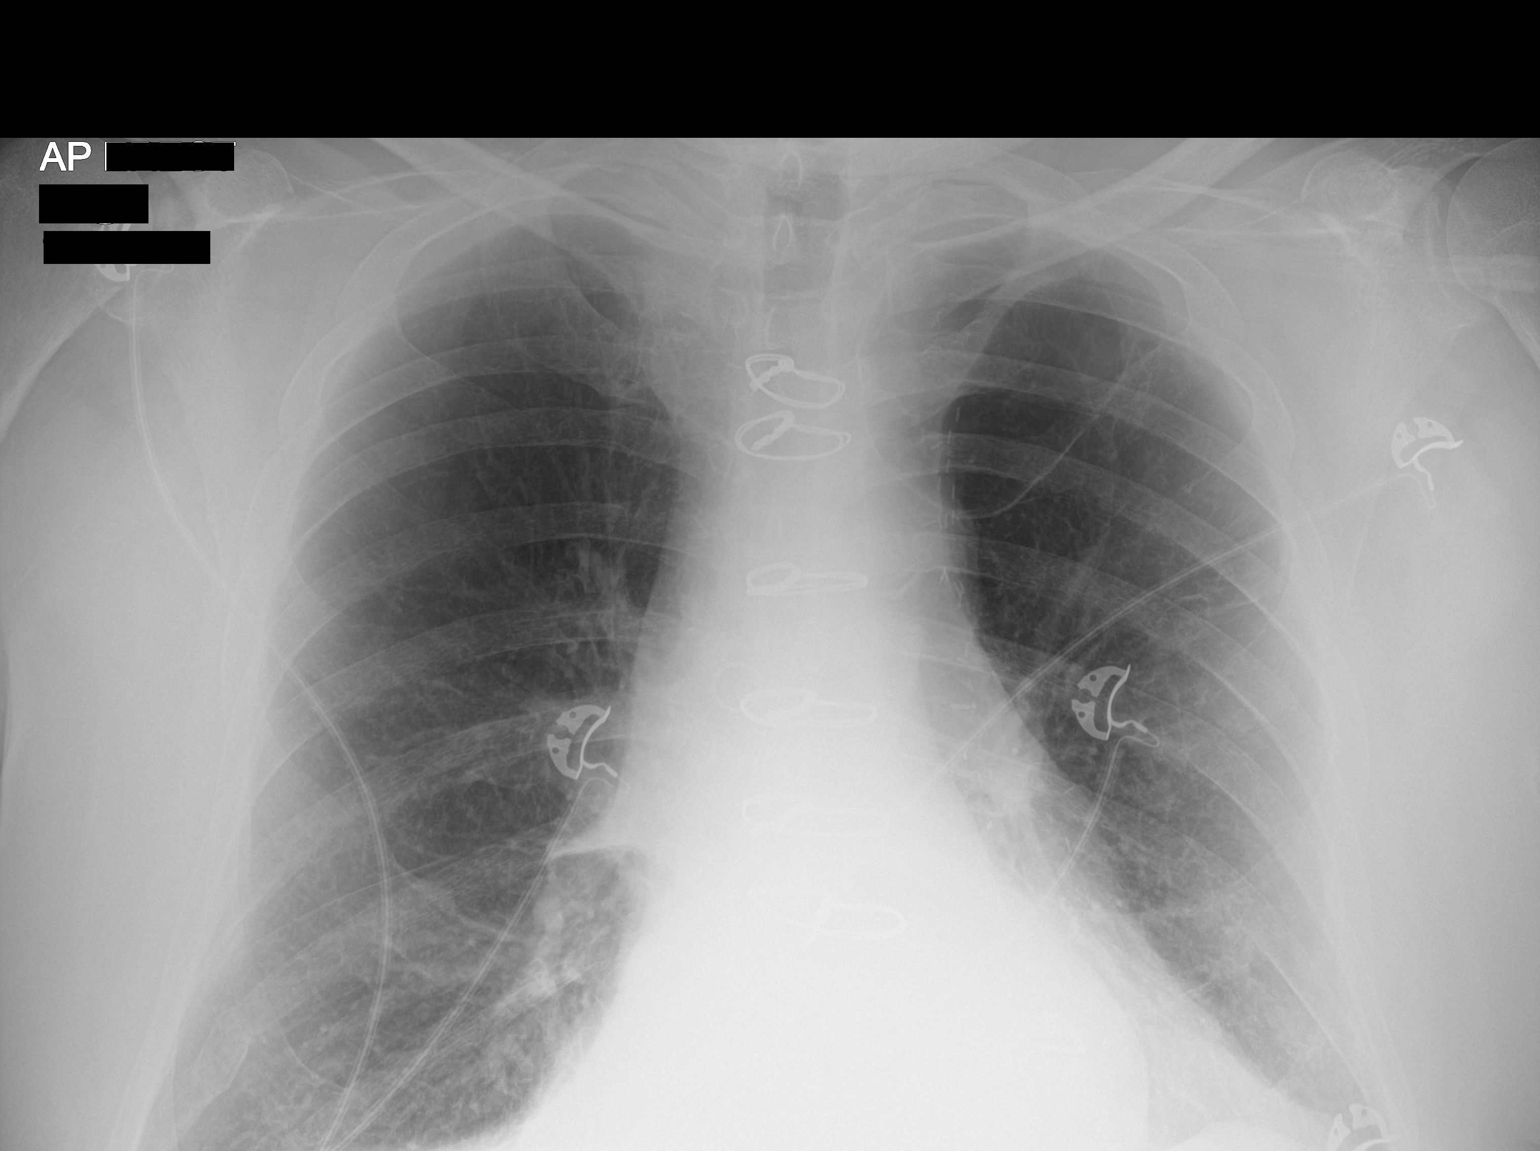
[im 2/2]
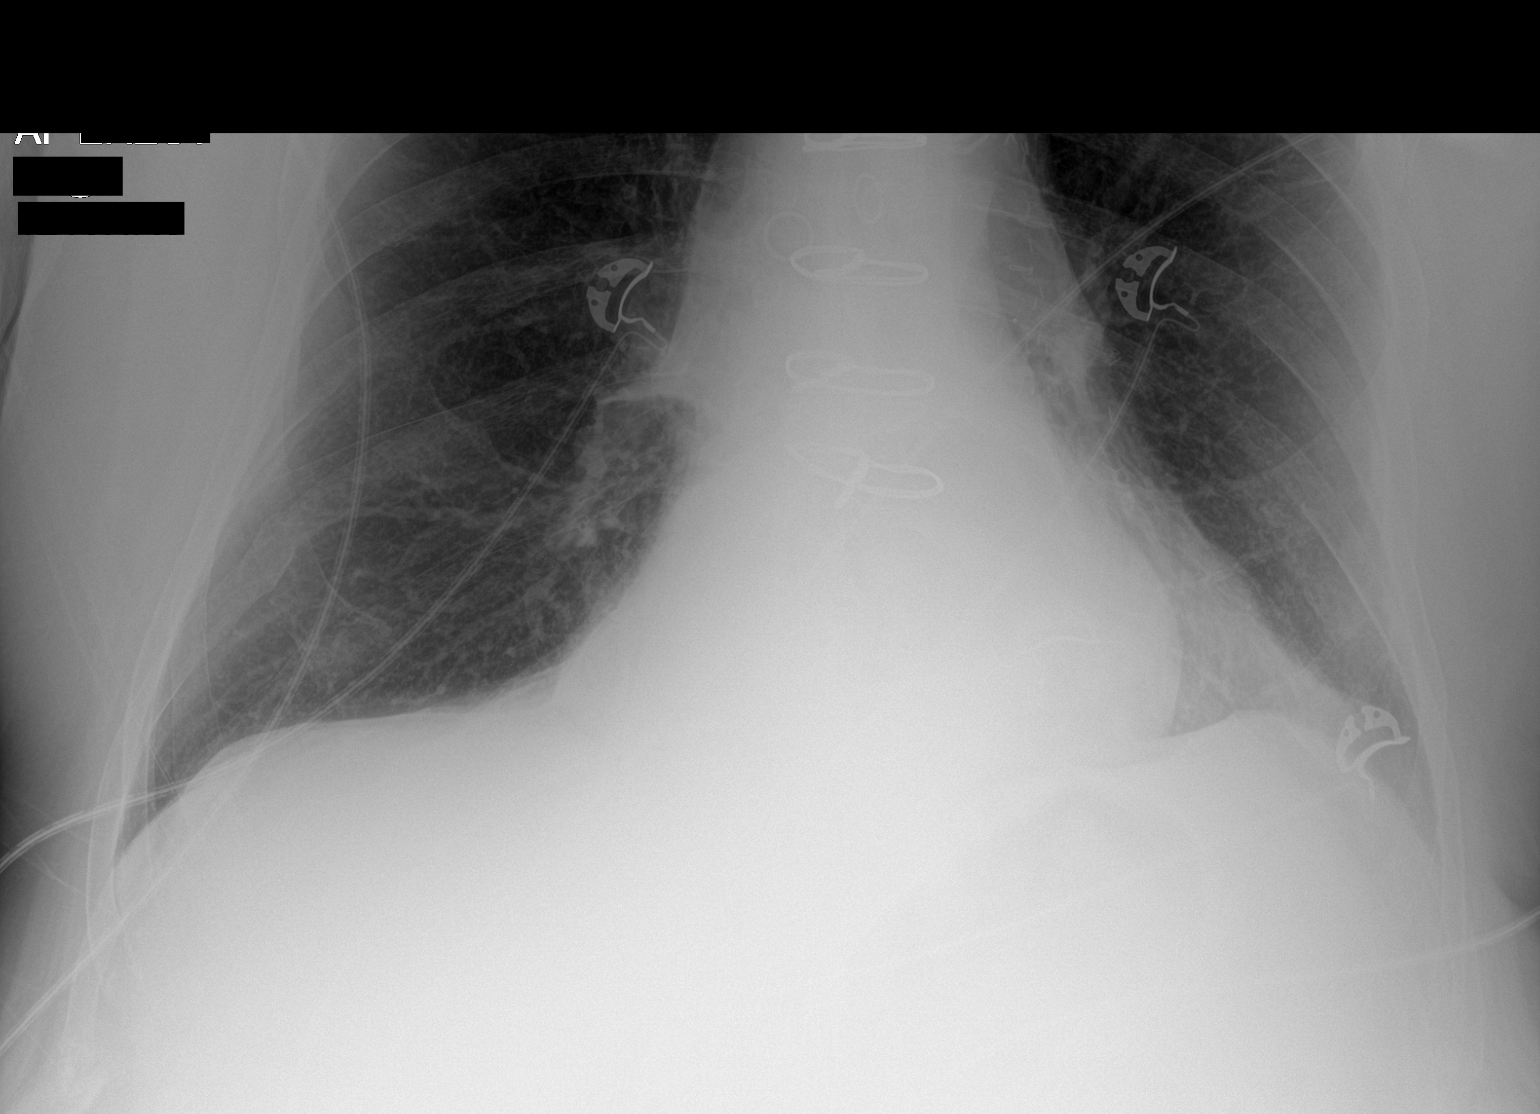

[2 of 2 positions shown; findings below may reference images not displayed]

FINDINGS: Heart size and pulmonary vascularity are normal. No infiltrates or
effusions. Hyperlucency in the upper lobes consistent with
emphysema. Prominent bleb in the left lung apex, unchanged.
Flattening of the diaphragm also consistent with emphysema. Large
chronic hiatal hernia.
IMPRESSION: Emphysema.  No acute abnormality.

## 2016-09-20 ENCOUNTER — Telehealth: Payer: Self-pay | Admitting: Pulmonary Disease

## 2016-09-20 NOTE — Telephone Encounter (Signed)
I see no documentation where anyone from our office has called this patient recently.  lmtcb X1 for patient.

## 2016-09-22 ENCOUNTER — Ambulatory Visit: Payer: Medicare HMO | Admitting: Pulmonary Disease

## 2016-09-22 NOTE — Telephone Encounter (Signed)
lmomtcb x 2  

## 2016-09-23 NOTE — Telephone Encounter (Signed)
Unable to contact the pt about this message.  Will sign off per protocol.

## 2016-09-28 ENCOUNTER — Telehealth: Payer: Self-pay | Admitting: Pulmonary Disease

## 2016-09-28 NOTE — Telephone Encounter (Signed)
Spoke with laura, who states pt would like to continue with physical therapy two times a week for four more weeks. Mickel Baas states pt is doing really well.   BQ please advise. Thanks.

## 2016-09-29 NOTE — Telephone Encounter (Signed)
No He didn't show up to his appointment He needs to come see [place] if he wants [place] to continue all these home health and PT orders

## 2016-09-29 NOTE — Telephone Encounter (Signed)
Called and spoke with [name] and she is aware of BQ recs.  She is aware that the pt cancelled in appts with BQ in November and December.  He does have an appt scheduled in Jan, but pt will have to keep this appt in order for BQ to ok the therapy.

## 2016-10-05 ENCOUNTER — Other Ambulatory Visit: Payer: Self-pay | Admitting: Gastroenterology

## 2016-10-09 ENCOUNTER — Encounter: Payer: Self-pay | Admitting: Urgent Care

## 2016-10-09 ENCOUNTER — Inpatient Hospital Stay
Admission: EM | Admit: 2016-10-09 | Discharge: 2016-10-12 | DRG: 377 | Disposition: A | Discharge status: Home Health | Payer: Medicare HMO | Attending: Internal Medicine | Admitting: Internal Medicine

## 2016-10-09 ENCOUNTER — Emergency Department: Payer: Medicare HMO

## 2016-10-09 DIAGNOSIS — Z794 Long term (current) use of insulin: Secondary | ICD-10-CM

## 2016-10-09 DIAGNOSIS — Z951 Presence of aortocoronary bypass graft: Secondary | ICD-10-CM | POA: Diagnosis not present

## 2016-10-09 DIAGNOSIS — I110 Hypertensive heart disease with heart failure: Secondary | ICD-10-CM | POA: Diagnosis present

## 2016-10-09 DIAGNOSIS — Z87891 Personal history of nicotine dependence: Secondary | ICD-10-CM | POA: Diagnosis not present

## 2016-10-09 DIAGNOSIS — I5032 Chronic diastolic (congestive) heart failure: Secondary | ICD-10-CM | POA: Diagnosis present

## 2016-10-09 DIAGNOSIS — Z880 Allergy status to penicillin: Secondary | ICD-10-CM

## 2016-10-09 DIAGNOSIS — Z9119 Patient's noncompliance with other medical treatment and regimen: Secondary | ICD-10-CM

## 2016-10-09 DIAGNOSIS — J440 Chronic obstructive pulmonary disease with acute lower respiratory infection: Secondary | ICD-10-CM | POA: Diagnosis present

## 2016-10-09 DIAGNOSIS — E1165 Type 2 diabetes mellitus with hyperglycemia: Secondary | ICD-10-CM | POA: Diagnosis present

## 2016-10-09 DIAGNOSIS — Z79899 Other long term (current) drug therapy: Secondary | ICD-10-CM

## 2016-10-09 DIAGNOSIS — F411 Generalized anxiety disorder: Secondary | ICD-10-CM | POA: Diagnosis present

## 2016-10-09 DIAGNOSIS — J189 Pneumonia, unspecified organism: Secondary | ICD-10-CM | POA: Diagnosis present

## 2016-10-09 DIAGNOSIS — F3160 Bipolar disorder, current episode mixed, unspecified: Secondary | ICD-10-CM | POA: Diagnosis present

## 2016-10-09 DIAGNOSIS — Z9981 Dependence on supplemental oxygen: Secondary | ICD-10-CM | POA: Diagnosis not present

## 2016-10-09 DIAGNOSIS — Z7951 Long term (current) use of inhaled steroids: Secondary | ICD-10-CM

## 2016-10-09 DIAGNOSIS — D500 Iron deficiency anemia secondary to blood loss (chronic): Secondary | ICD-10-CM | POA: Diagnosis present

## 2016-10-09 DIAGNOSIS — Z885 Allergy status to narcotic agent status: Secondary | ICD-10-CM | POA: Diagnosis not present

## 2016-10-09 DIAGNOSIS — Z9181 History of falling: Secondary | ICD-10-CM

## 2016-10-09 DIAGNOSIS — Z6834 Body mass index (BMI) 34.0-34.9, adult: Secondary | ICD-10-CM

## 2016-10-09 DIAGNOSIS — Z888 Allergy status to other drugs, medicaments and biological substances status: Secondary | ICD-10-CM | POA: Diagnosis not present

## 2016-10-09 DIAGNOSIS — J9610 Chronic respiratory failure, unspecified whether with hypoxia or hypercapnia: Secondary | ICD-10-CM | POA: Diagnosis present

## 2016-10-09 DIAGNOSIS — M6281 Muscle weakness (generalized): Secondary | ICD-10-CM

## 2016-10-09 DIAGNOSIS — R0602 Shortness of breath: Secondary | ICD-10-CM

## 2016-10-09 DIAGNOSIS — Y95 Nosocomial condition: Secondary | ICD-10-CM | POA: Diagnosis present

## 2016-10-09 DIAGNOSIS — I2510 Atherosclerotic heart disease of native coronary artery without angina pectoris: Secondary | ICD-10-CM | POA: Diagnosis present

## 2016-10-09 DIAGNOSIS — E785 Hyperlipidemia, unspecified: Secondary | ICD-10-CM | POA: Diagnosis present

## 2016-10-09 DIAGNOSIS — R531 Weakness: Secondary | ICD-10-CM | POA: Diagnosis present

## 2016-10-09 DIAGNOSIS — K2951 Unspecified chronic gastritis with bleeding: Secondary | ICD-10-CM | POA: Diagnosis present

## 2016-10-09 DIAGNOSIS — R2681 Unsteadiness on feet: Secondary | ICD-10-CM

## 2016-10-09 LAB — BASIC METABOLIC PANEL
ANION GAP: 8 (ref 5–15)
BUN: 40 mg/dL — AB (ref 6–20)
CO2: 24 mmol/L (ref 22–32)
Calcium: 9.3 mg/dL (ref 8.9–10.3)
Chloride: 103 mmol/L (ref 101–111)
Creatinine, Ser: 1.1 mg/dL (ref 0.61–1.24)
GFR calc Af Amer: >60 mL/min (ref >60)
GLUCOSE: 285 mg/dL — AB (ref 65–99)
POTASSIUM: 5.1 mmol/L (ref 3.5–5.1)
Sodium: 135 mmol/L (ref 135–145)

## 2016-10-09 LAB — PROCALCITONIN: Procalcitonin: 0.11 ng/mL

## 2016-10-09 LAB — BLOOD GAS, VENOUS
ACID-BASE EXCESS: 3.1 mmol/L — AB (ref 0.0–2.0)
BICARBONATE: 30.1 mmol/L — AB (ref 20.0–28.0)
PATIENT TEMPERATURE: 37
pCO2, Ven: 57 mmHg (ref 44.0–60.0)
pH, Ven: 7.33 (ref 7.250–7.430)
pO2, Ven: <31 mmHg — CL (ref 32.0–45.0)

## 2016-10-09 LAB — IRON AND TIBC
IRON: 24 ug/dL — AB (ref 45–182)
SATURATION RATIOS: 7 % — AB (ref 17.9–39.5)
TIBC: 332 ug/dL (ref 250–450)
UIBC: 308 ug/dL

## 2016-10-09 LAB — CBC
HEMATOCRIT: 26.3 % — AB (ref 40.0–52.0)
HEMOGLOBIN: 8.6 g/dL — AB (ref 13.0–18.0)
MCH: 29.2 pg (ref 26.0–34.0)
MCHC: 32.7 g/dL (ref 32.0–36.0)
MCV: 89.3 fL (ref 80.0–100.0)
Platelets: 255 10*3/uL (ref 150–440)
RBC: 2.95 MIL/uL — ABNORMAL LOW (ref 4.40–5.90)
RDW: 15.1 % — AB (ref 11.5–14.5)
WBC: 11.4 10*3/uL — ABNORMAL HIGH (ref 3.8–10.6)

## 2016-10-09 LAB — TROPONIN I: Troponin I: <0.03 ng/mL (ref <0.03)

## 2016-10-09 LAB — GLUCOSE, CAPILLARY
GLUCOSE-CAPILLARY: 267 mg/dL — AB (ref 65–99)
GLUCOSE-CAPILLARY: 291 mg/dL — AB (ref 65–99)
Glucose-Capillary: 224 mg/dL — ABNORMAL HIGH (ref 65–99)

## 2016-10-09 LAB — BRAIN NATRIURETIC PEPTIDE: B NATRIURETIC PEPTIDE 5: 58 pg/mL (ref 0.0–100.0)

## 2016-10-09 LAB — LACTIC ACID, PLASMA
LACTIC ACID, VENOUS: 2.3 mmol/L — AB (ref 0.5–1.9)
Lactic Acid, Venous: 2.5 mmol/L (ref 0.5–1.9)

## 2016-10-09 LAB — RETICULOCYTES
RBC.: 2.4 MIL/uL — ABNORMAL LOW (ref 4.40–5.90)
RETIC COUNT ABSOLUTE: 139.2 10*3/uL (ref 19.0–183.0)
Retic Ct Pct: 5.8 % — ABNORMAL HIGH (ref 0.4–3.1)

## 2016-10-09 LAB — MRSA PCR SCREENING: MRSA by PCR: NEGATIVE

## 2016-10-09 LAB — FERRITIN: FERRITIN: 16 ng/mL — AB (ref 24–336)

## 2016-10-09 MED ORDER — DEXTROSE 5 % IV SOLN
2.0000 g | Freq: Three times a day (TID) | INTRAVENOUS | Status: DC
  Administered 2016-10-09 – 2016-10-10 (×2): 2 g via INTRAVENOUS
  Filled 2016-10-09 (×5): qty 2

## 2016-10-09 MED ORDER — ZOLPIDEM TARTRATE 5 MG PO TABS
5.0000 mg | ORAL_TABLET | Freq: Every evening | ORAL | Status: DC | PRN
  Administered 2016-10-09 – 2016-10-11 (×3): 5 mg via ORAL
  Filled 2016-10-09 (×3): qty 1

## 2016-10-09 MED ORDER — METOPROLOL TARTRATE 25 MG PO TABS
25.0000 mg | ORAL_TABLET | Freq: Two times a day (BID) | ORAL | Status: DC
  Administered 2016-10-09 – 2016-10-10 (×3): 25 mg via ORAL
  Filled 2016-10-09 (×3): qty 1

## 2016-10-09 MED ORDER — FERROUS SULFATE 325 (65 FE) MG PO TABS
325.0000 mg | ORAL_TABLET | Freq: Three times a day (TID) | ORAL | Status: DC
  Administered 2016-10-09 – 2016-10-12 (×9): 325 mg via ORAL
  Filled 2016-10-09 (×9): qty 1

## 2016-10-09 MED ORDER — IPRATROPIUM-ALBUTEROL 0.5-2.5 (3) MG/3ML IN SOLN: 3.0000 mL | RESPIRATORY_TRACT | Status: DC | PRN

## 2016-10-09 MED ORDER — IPRATROPIUM-ALBUTEROL 0.5-2.5 (3) MG/3ML IN SOLN
3.0000 mL | Freq: Once | RESPIRATORY_TRACT | Status: AC
  Administered 2016-10-09: 3 mL via RESPIRATORY_TRACT
  Filled 2016-10-09: qty 3

## 2016-10-09 MED ORDER — ATORVASTATIN CALCIUM 20 MG PO TABS
40.0000 mg | ORAL_TABLET | Freq: Every day | ORAL | Status: DC
  Administered 2016-10-09 – 2016-10-11 (×3): 40 mg via ORAL
  Filled 2016-10-09 (×3): qty 2

## 2016-10-09 MED ORDER — VANCOMYCIN HCL IN DEXTROSE 1-5 GM/200ML-% IV SOLN
1000.0000 mg | Freq: Once | INTRAVENOUS | Status: AC
  Administered 2016-10-09: 1000 mg via INTRAVENOUS
  Filled 2016-10-09: qty 200

## 2016-10-09 MED ORDER — TRAMADOL HCL 50 MG PO TABS
50.0000 mg | ORAL_TABLET | Freq: Once | ORAL | Status: AC
  Administered 2016-10-09: 50 mg via ORAL
  Filled 2016-10-09: qty 1

## 2016-10-09 MED ORDER — SODIUM CHLORIDE 0.9 % IV BOLUS (SEPSIS)
1000.0000 mL | Freq: Once | INTRAVENOUS | Status: AC
  Administered 2016-10-09: 1000 mL via INTRAVENOUS

## 2016-10-09 MED ORDER — INSULIN ASPART 100 UNIT/ML ~~LOC~~ SOLN
0.0000 [IU] | Freq: Three times a day (TID) | SUBCUTANEOUS | Status: DC
  Administered 2016-10-09: 5 [IU] via SUBCUTANEOUS
  Administered 2016-10-10 (×3): 3 [IU] via SUBCUTANEOUS
  Administered 2016-10-11 (×3): 5 [IU] via SUBCUTANEOUS
  Administered 2016-10-12: 2 [IU] via SUBCUTANEOUS
  Administered 2016-10-12: 7 [IU] via SUBCUTANEOUS
  Filled 2016-10-09: qty 2
  Filled 2016-10-09: qty 3
  Filled 2016-10-09 (×3): qty 5
  Filled 2016-10-09: qty 3
  Filled 2016-10-09: qty 7
  Filled 2016-10-09: qty 3
  Filled 2016-10-09: qty 5

## 2016-10-09 MED ORDER — ACETAMINOPHEN 650 MG RE SUPP: 650.0000 mg | Freq: Four times a day (QID) | RECTAL | Status: DC | PRN

## 2016-10-09 MED ORDER — VITAMIN D 1000 UNITS PO TABS
1000.0000 [IU] | ORAL_TABLET | Freq: Every day | ORAL | Status: DC
  Administered 2016-10-09 – 2016-10-12 (×5): 1000 [IU] via ORAL
  Filled 2016-10-09 (×4): qty 1

## 2016-10-09 MED ORDER — TRAMADOL-ACETAMINOPHEN 37.5-325 MG PO TABS
1.0000 | ORAL_TABLET | Freq: Two times a day (BID) | ORAL | Status: DC | PRN
  Administered 2016-10-09 – 2016-10-12 (×5): 1 via ORAL
  Filled 2016-10-09 (×5): qty 1

## 2016-10-09 MED ORDER — AZTREONAM 2 G IJ SOLR
2.0000 g | Freq: Three times a day (TID) | INTRAMUSCULAR | Status: DC
  Filled 2016-10-09: qty 2

## 2016-10-09 MED ORDER — PANTOPRAZOLE SODIUM 40 MG PO TBEC
40.0000 mg | DELAYED_RELEASE_TABLET | Freq: Every day | ORAL | Status: DC
  Administered 2016-10-10 – 2016-10-12 (×3): 40 mg via ORAL
  Filled 2016-10-09 (×4): qty 1

## 2016-10-09 MED ORDER — VANCOMYCIN HCL 10 G IV SOLR
1250.0000 mg | Freq: Two times a day (BID) | INTRAVENOUS | Status: DC
  Administered 2016-10-10: 1250 mg via INTRAVENOUS
  Filled 2016-10-09 (×3): qty 1250

## 2016-10-09 MED ORDER — ACETAMINOPHEN 325 MG PO TABS
650.0000 mg | ORAL_TABLET | Freq: Four times a day (QID) | ORAL | Status: DC | PRN
  Administered 2016-10-11: 650 mg via ORAL
  Filled 2016-10-09: qty 2

## 2016-10-09 MED ORDER — OXYCODONE HCL 5 MG PO TABS
5.0000 mg | ORAL_TABLET | ORAL | Status: DC | PRN
  Filled 2016-10-09: qty 1

## 2016-10-09 MED ORDER — LISINOPRIL 5 MG PO TABS
2.5000 mg | ORAL_TABLET | Freq: Every day | ORAL | Status: DC
  Administered 2016-10-09 – 2016-10-12 (×4): 2.5 mg via ORAL
  Filled 2016-10-09 (×4): qty 1

## 2016-10-09 MED ORDER — MOMETASONE FURO-FORMOTEROL FUM 200-5 MCG/ACT IN AERO
2.0000 | INHALATION_SPRAY | Freq: Two times a day (BID) | RESPIRATORY_TRACT | Status: DC
  Administered 2016-10-09 – 2016-10-12 (×6): 2 via RESPIRATORY_TRACT
  Filled 2016-10-09: qty 8.8

## 2016-10-09 MED ORDER — UMECLIDINIUM BROMIDE 62.5 MCG/INH IN AEPB: 1.0000 | INHALATION_SPRAY | Freq: Every day | RESPIRATORY_TRACT | Status: DC

## 2016-10-09 MED ORDER — TIOTROPIUM BROMIDE MONOHYDRATE 18 MCG IN CAPS
18.0000 ug | ORAL_CAPSULE | Freq: Every day | RESPIRATORY_TRACT | Status: DC
  Administered 2016-10-09 – 2016-10-12 (×4): 18 ug via RESPIRATORY_TRACT
  Filled 2016-10-09: qty 5

## 2016-10-09 MED ORDER — CITALOPRAM HYDROBROMIDE 20 MG PO TABS
40.0000 mg | ORAL_TABLET | Freq: Every day | ORAL | Status: DC
  Administered 2016-10-09 – 2016-10-12 (×4): 40 mg via ORAL
  Filled 2016-10-09 (×4): qty 2

## 2016-10-09 MED ORDER — INSULIN ASPART 100 UNIT/ML ~~LOC~~ SOLN
0.0000 [IU] | Freq: Every day | SUBCUTANEOUS | Status: DC
  Administered 2016-10-09: 3 [IU] via SUBCUTANEOUS
  Administered 2016-10-10: 2 [IU] via SUBCUTANEOUS
  Administered 2016-10-11: 3 [IU] via SUBCUTANEOUS
  Filled 2016-10-09 (×2): qty 3
  Filled 2016-10-09: qty 2

## 2016-10-09 MED ORDER — ALPRAZOLAM 0.5 MG PO TABS
0.5000 mg | ORAL_TABLET | Freq: Three times a day (TID) | ORAL | Status: DC | PRN
  Administered 2016-10-11 – 2016-10-12 (×2): 0.5 mg via ORAL
  Filled 2016-10-09 (×2): qty 1

## 2016-10-09 MED ORDER — SODIUM CHLORIDE 0.9% FLUSH
3.0000 mL | Freq: Two times a day (BID) | INTRAVENOUS | Status: DC
  Administered 2016-10-09 – 2016-10-12 (×6): 3 mL via INTRAVENOUS

## 2016-10-09 MED ORDER — SUCRALFATE 1 G PO TABS
1.0000 g | ORAL_TABLET | Freq: Three times a day (TID) | ORAL | Status: DC
  Administered 2016-10-09 – 2016-10-12 (×12): 1 g via ORAL
  Filled 2016-10-09 (×14): qty 1

## 2016-10-09 MED ORDER — GABAPENTIN 300 MG PO CAPS
300.0000 mg | ORAL_CAPSULE | Freq: Three times a day (TID) | ORAL | Status: DC
  Administered 2016-10-09 – 2016-10-12 (×9): 300 mg via ORAL
  Filled 2016-10-09 (×9): qty 1

## 2016-10-09 MED ORDER — AZTREONAM 2 G IJ SOLR
2.0000 g | Freq: Once | INTRAMUSCULAR | Status: AC
  Administered 2016-10-09: 2 g via INTRAVENOUS
  Filled 2016-10-09: qty 2

## 2016-10-09 MED ORDER — INSULIN GLARGINE 100 UNIT/ML ~~LOC~~ SOLN
20.0000 [IU] | Freq: Two times a day (BID) | SUBCUTANEOUS | Status: DC
  Administered 2016-10-09 – 2016-10-12 (×6): 20 [IU] via SUBCUTANEOUS
  Filled 2016-10-09 (×8): qty 0.2

## 2016-10-09 NOTE — ED Triage Notes (Addendum)
Patient presents to the ED via [facility]. Patient with complaints of weakness, SOB, and GI bleeding. Patient states, "I should have been here 2 weeks ago, but I thought I could fight it". Patient with chronic GI bleeding and "lung problems"; uses 5L/Arabi ATC at home. Patient states, "Your GI doctors dont know what to do with me. They tell me they don't know what it is, and that they do not know how to treat it". Patient exertionally dyspneic; very talkative upon arrival; no increased WOB noted. Patient also HYPERglycemic; CBG with EMS was 370.

## 2016-10-09 NOTE — ED Notes (Signed)
This RN to bedside at this time. Pt requesting a urinal, 150cc's urine output at this time. This RN introduced self to patient. Pt noted to be resting in bed at this time, remains on 5L O2 via [place] at this time. Will continue to monitor for further patient needs. Pt is alert and oriented, noted to be very talkative at this time.
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
## 2016-10-09 NOTE — ED Notes (Signed)
This RN to bedside, c/o being cold. Pt's heat turned on in room. Pt is alert and oriented at this time. Pt resting in bed. Will continue to monitor for further patient needs.

## 2016-10-09 NOTE — ED Provider Notes (Signed)
Peripheral IV insertion by physician Indication: Multiple failed attempts by nursing staff, need for IV access and/or blood samples for workup Performed by me at 9:10am under continuous real-time ultrasound visualization Area cleaned with chlorhexidine. 20-gauge IV successfully placed in the right antecubital fossa. 1 attempt, no complications, EBL 0.    ----------------------------------------- 10:57 AM on 10/09/2016 -----------------------------------------  Persistent tachycardia. Elevated lactate of 2.3. Chest x-ray consistent with left lower lobe pneumonia. Antibiotics started, case discussed with hospitalist for admission. Chronic anemia appears to be unchanged at this time.     Carrie Mew, MD 10/09/16 1057

## 2016-10-09 NOTE — ED Notes (Signed)
This RN and Dr. [name] at bedside for MD to perform [place] IV. While at bedside pt began speaking in an asian accent regarding phlebotomists and drawing his blood. Pt also states that this RN's voice is offensively loud while this RN and MD at bedside. Pt is noted to be alert and oriented. This RN apologized for volume of voice and lowered her voice. Pt states post [place] IV that this RN is "so sweet". This RN repositioned patient for comfort. Will continue to monitor for further patient needs.

## 2016-10-09 NOTE — ED Notes (Signed)
Report given to Leslie, RN.

## 2016-10-09 NOTE — Progress Notes (Signed)
   [place] [facility] Day: 0 days Bastion Giere is a 75 y.o. male presenting with Weakness; Shortness of Breath; GI Bleeding; and Hyperglycemia .   Advance care planning discussed with patient  without additional Family at bedside. All questions in regards to overall condition and expected prognosis answered. The decision was made to change current code status  CODE STATUS: full Time spent: 18 minutes   "I want to be resuscitated, but not live on machines forever"

## 2016-10-09 NOTE — ED Notes (Signed)
Guaiac performed for fecal occult blood; results POSITIVE.

## 2016-10-09 NOTE — ED Notes (Signed)
This RN to bedside at this time. Pt turned to his side for comfort. Pt is alert and oriented at this time. This RN explained that admitting physician would be in room soon. Pt states understanding.

## 2016-10-09 NOTE — Progress Notes (Addendum)
Pharmacy Antibiotic Note  [name] is a 75 y.o. male admitted on 10/09/2016 with pneumonia.  Pharmacy has been consulted for Aztreonam and vancomycin dosing.  Plan: Vancomycin 1000mg  IV x1 given in the ED. Aztreonam 2g IV x1 given in the ED.  Vancomycin 1250 mg IV every 12 hours.  Goal trough 15-20 mcg/mL. Aztreonam 2g Q8h  Vancomycin trough before the fifth dose. If MRSA negative then ask to D/C vancomycin.   Vancomycin kinetics:   CrCl=67.7 Ke=0.061 Vd=66.9 T1/2=11.36  Stacked dose=6 hours  Weight: 270 lb (122.5 kg)  Adjusted body weight:95.62kg  Temp (24hrs), Avg:98.5 F (36.9 C), Min:98.3 F (36.8 C), Max:98.6 F (37 C)   Recent Labs Lab 10/09/16 0625 10/09/16 0841 10/09/16 1208  WBC 11.4*  --   --   CREATININE 1.10  --   --   LATICACIDVEN  --  2.3* 2.5*    Estimated Creatinine Clearance: 82.5 mL/min (by C-G formula based on SCr of 1.1 mg/dL).    Allergies  Allergen Reactions  . Morphine And Related Shortness Of Breath, Nausea And Vomiting, Rash and Other (See Comments)    Reaction:  Hallucinations   . Penicillins Anaphylaxis, Hives and Other (See Comments)    Has patient had a PCN reaction causing immediate rash, facial/tongue/throat swelling, SOB or lightheadedness with hypotension: Yes Has patient had a PCN reaction causing severe rash involving mucus membranes or skin necrosis: No Has patient had a PCN reaction that required hospitalization No Has patient had a PCN reaction occurring within the last 10 years: No If all of the above answers are "NO", then may proceed with Cephalosporin use.  . Demerol [Meperidine] Other (See Comments)    Reaction:  Hallucinations    . Dilaudid [Hydromorphone Hcl] Other (See Comments)    Reaction:  Hallucinations   . Levofloxacin Other (See Comments)    Reaction:  Unknown     Antimicrobials this admission: Vancomycin 12/24 >>  Aztreonam 12/24 >>    Microbiology results: 12/24 BCx: in process 12/24 MRSA PCR:  recommend   Thank you for allowing pharmacy to be a part of this patient's care.  Loree Fee, PharmD 10/09/2016 1:54 PM

## 2016-10-09 NOTE — ED Notes (Signed)
Pt resting in bed at this time. Eyes noted to be closed. Respirations even and unlabored, pt remains on 5L O2 via . Will continue to monitor for further patient needs.

## 2016-10-09 NOTE — Progress Notes (Signed)
Lab reports critical value: Lactic acid 2.5.

## 2016-10-09 NOTE — ED Notes (Signed)
This RN attempted x 2 to start peripheral IV.

## 2016-10-09 NOTE — ED Notes (Signed)
Radiology to bedside for [facility].

## 2016-10-09 NOTE — Care Management Note (Addendum)
Case Management Note  Patient Details  Name: [name] MRN: [id number] Date of Birth: 1941/07/11  Subjective/Objective:       [name] was admitted from home with a diagnosis of Pneumonia, and chronic rectal bleeding. He is currently an open client of [facility] and is receiving RN and PT home health services. [name]'s chronic 5L N/C is provided by [facility]. Case Management will follow for discharge planning.               Action/Plan:   Expected Discharge Date:                  Expected Discharge Plan:     In-House Referral:     Discharge planning Services     Post Acute Care Choice:    Choice offered to:     DME Arranged:    DME Agency:     HH Arranged:    HH Agency:     Status of Service:     If discussed at [employer] of Stay Meetings, dates discussed:    Additional Comments:  Georjean Toya A, RN 10/09/2016, 2:11 PM
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
## 2016-10-09 NOTE — Progress Notes (Signed)
Held Lantus scheduled for 2pm, will resume at 10pm this evening. Coverage with [employer] for BG 267. Pt has not been taking meds regularly at home, does not feel comfortable taking doses close together. Paged MD to notify.

## 2016-10-09 NOTE — Progress Notes (Signed)
Tele called with report of Sinus Tach 110-130, with bundle branch block. Paged MD. Pt sleeping. Will continue to monitor.

## 2016-10-10 LAB — CBC
HCT: 21.4 % — ABNORMAL LOW (ref 40.0–52.0)
Hemoglobin: 7.1 g/dL — ABNORMAL LOW (ref 13.0–18.0)
MCH: 29.9 pg (ref 26.0–34.0)
MCHC: 33.2 g/dL (ref 32.0–36.0)
MCV: 90.2 fL (ref 80.0–100.0)
PLATELETS: 204 10*3/uL (ref 150–440)
RBC: 2.37 MIL/uL — ABNORMAL LOW (ref 4.40–5.90)
RDW: 15.5 % — AB (ref 11.5–14.5)
WBC: 9.4 10*3/uL (ref 3.8–10.6)

## 2016-10-10 LAB — LACTIC ACID, PLASMA: LACTIC ACID, VENOUS: 2 mmol/L — AB (ref 0.5–1.9)

## 2016-10-10 LAB — BASIC METABOLIC PANEL
ANION GAP: 5 (ref 5–15)
BUN: 31 mg/dL — ABNORMAL HIGH (ref 6–20)
CALCIUM: 8.1 mg/dL — AB (ref 8.9–10.3)
CO2: 26 mmol/L (ref 22–32)
Chloride: 103 mmol/L (ref 101–111)
Creatinine, Ser: 1.23 mg/dL (ref 0.61–1.24)
GFR calc Af Amer: >60 mL/min (ref >60)
GFR, EST NON AFRICAN AMERICAN: 56 mL/min — AB (ref >60)
GLUCOSE: 306 mg/dL — AB (ref 65–99)
Potassium: 4.3 mmol/L (ref 3.5–5.1)
SODIUM: 134 mmol/L — AB (ref 135–145)

## 2016-10-10 LAB — GLUCOSE, CAPILLARY
GLUCOSE-CAPILLARY: 225 mg/dL — AB (ref 65–99)
Glucose-Capillary: 239 mg/dL — ABNORMAL HIGH (ref 65–99)
Glucose-Capillary: 223 mg/dL — ABNORMAL HIGH (ref 65–99)
Glucose-Capillary: 236 mg/dL — ABNORMAL HIGH (ref 65–99)

## 2016-10-10 LAB — PREPARE RBC (CROSSMATCH)

## 2016-10-10 MED ORDER — SODIUM CHLORIDE 0.9 % IV SOLN
Freq: Once | INTRAVENOUS | Status: AC
  Administered 2016-10-10: 12:00:00 via INTRAVENOUS

## 2016-10-10 NOTE — Progress Notes (Addendum)
Patient requesting to speak to me. This RN went into room and patient state that he was "forced" to get OOB by himself, because nursing did not answer his call light fast enough. Explained to patient that nurse call lights are answered as fast as staff can get to them, but some call lights need more than one nurse. He only uses the call light when he "really needs something", but I explained that he has been uses the call light all day to adjust room temp, close/open door, moving table, and many other tasks. Strong recommended that patient not get OOB without staff even if it means being incont in the bed. Bed alarm on for safety.

## 2016-10-10 NOTE — Progress Notes (Signed)
Dr. Estanislado Pandy notified about need of third lactic acid lab . New orders given.

## 2016-10-10 NOTE — Progress Notes (Signed)
Patient not compliant with calling to walk to BR, has been consistent with calling for opening/closing door, adjusting rm temp, calling out for medication. Education given of safety concern, had just given medication at which time he stated he was tired. education not received well, stated he will do as he pleases. Very agitated

## 2016-10-10 NOTE — Progress Notes (Signed)
[facility] [place] NAME: [name]    MR#:  [id number]  DATE OF BIRTH:  [birth year]  SUBJECTIVE:   Came in with increasing sob and weakness. Found to have anemia REVIEW OF SYSTEMS:   Review of Systems  Constitutional: Negative for chills, fever and weight loss.  HENT: Negative for ear discharge, ear pain and nosebleeds.   Eyes: Negative for blurred vision, pain and discharge.  Respiratory: Negative for sputum production, shortness of breath, wheezing and stridor.   Cardiovascular: Negative for chest pain, palpitations, orthopnea and PND.  Gastrointestinal: Negative for abdominal pain, diarrhea, nausea and vomiting.  Genitourinary: Negative for frequency and urgency.  Musculoskeletal: Negative for back pain and joint pain.  Neurological: Negative for sensory change, speech change, focal weakness and weakness.  Psychiatric/Behavioral: Negative for depression and hallucinations. The patient is not nervous/anxious.    Tolerating Diet:yes Tolerating PT: pending  DRUG ALLERGIES:   Allergies  Allergen Reactions  . Morphine And Related Shortness Of Breath, Nausea And Vomiting, Rash and Other (See Comments)    Reaction:  Hallucinations   . Penicillins Anaphylaxis, Hives and Other (See Comments)    Has patient had a PCN reaction causing immediate rash, facial/tongue/throat swelling, SOB or lightheadedness with hypotension: Yes Has patient had a PCN reaction causing severe rash involving mucus membranes or skin necrosis: No Has patient had a PCN reaction that required hospitalization No Has patient had a PCN reaction occurring within the last 10 years: No If all of the above answers are "NO", then may proceed with Cephalosporin use.  . Demerol [Meperidine] Other (See Comments)    Reaction:  Hallucinations    . Dilaudid [Hydromorphone Hcl] Other (See Comments)    Reaction:  Hallucinations   . Levofloxacin Other (See Comments)     Reaction:  Unknown     VITALS:  Blood pressure (!) 102/53, pulse 91, temperature 98.5 F (36.9 C), temperature source Oral, resp. rate 18, weight 122.5 kg (270 lb), SpO2 98 %.  PHYSICAL EXAMINATION:   Physical Exam  GENERAL:  75 y.o.-year-old patient lying in the bed with no acute distress. obese EYES: Pupils equal, round, reactive to light and accommodation. No scleral icterus. Extraocular muscles intact.  HEENT: Head atraumatic, normocephalic. Oropharynx and nasopharynx clear.  NECK:  Supple, no jugular venous distention. No thyroid enlargement, no tenderness.  LUNGS: Normal breath sounds bilaterally, no wheezing, rales, rhonchi. No use of accessory muscles of respiration.  CARDIOVASCULAR: S1, S2 normal. No murmurs, rubs, or gallops.  ABDOMEN: Soft, nontender, nondistended. Bowel sounds present. No organomegaly or mass.  EXTREMITIES: No cyanosis, clubbing or edema b/l.    NEUROLOGIC: Cranial nerves II through XII are intact. No focal Motor or sensory deficits b/l.   PSYCHIATRIC:  patient is alert and oriented x 3.  SKIN: No obvious rash, lesion, or ulcer.   LABORATORY PANEL:  CBC  Recent Labs Lab 10/10/16 0329  WBC 9.4  HGB 7.1*  HCT 21.4*  PLT 204    Chemistries   Recent Labs Lab 10/10/16 0329  NA 134*  K 4.3  CL 103  CO2 26  GLUCOSE 306*  BUN 31*  CREATININE 1.23  CALCIUM 8.1*   Cardiac Enzymes  Recent Labs Lab 10/09/16 0625  TROPONINI <0.03   RADIOLOGY:  Dg Chest Portable 1 View  Result Date: 10/09/2016 CLINICAL DATA:  Weakness, shortness of breath, and GI bleeding for 2 weeks, history COPD, abdominal aortic aneurysm, coronary artery disease, hypertension, type II  diabetes mellitus, former smoker EXAM: PORTABLE CHEST 1 VIEW COMPARISON:  Portable exam [id number] hours compared to 07/24/2016 FINDINGS: Upper normal heart size post CABG. Atherosclerotic calcification aorta. Hiatal hernia, noted on prior CT from 2016. Mediastinal contours and pulmonary  vascularity otherwise normal. Stable pleural thickening at LEFT apex. Underlying emphysematous changes with chronic atelectasis versus consolidation in LEFT lower lobe. Linear scarring LEFT upper lobe. No definite pleural effusion or pneumothorax. Bones demineralized. IMPRESSION: Changes of COPD with LEFT upper lobe scarring and chronic atelectasis versus consolidation in LEFT lower lobe. Hiatal hernia. Post CABG. Electronically Signed   By: Lavonia Dana M.D.   On: 10/09/2016 07:25   ASSESSMENT AND PLAN:   74 year old Caucasian gentleman history of COPD 5 L oxygen baseline, chronic GI bleed presenting with dyspnea on exertion, weakness  1. Increasing sob and weakness due to acute on chornic anemia -no s/o infection or pneumonia -d/c IV abxs  2. Chronic GI bleed: Hemoglobin is 7.1  -will check iron levels given low MCV, high RDW patient will likely require iron supplementation again -transfuse 1 unit today Was 8.9---7.1 -cont po iron pills -pt will benefit from Hematology f/u  3. Hyperlipidemia unspecified on statin therapy  4. Type 2 diabetes insulin requiring: Hold oral agents continue sliding so, for treatment of basal insulin  5. Essential tension: Lisinopril, Lopressor  PT to see Case discussed with Care Management/Social Worker. Management plans discussed with the patient, family and they are in agreement.  CODE STATUS:full DVT Prophylaxis: scd TOTAL TIME TAKING CARE OF THIS PATIENT: 30 minutes.  >50% time spent on counselling and coordination of care  POSSIBLE D/C IN 1 DAYS, DEPENDING ON CLINICAL CONDITION.  Note: This dictation was prepared with Dragon dictation along with smaller phrase technology. Any transcriptional errors that result from this process are unintentional.  Davidson Palmieri M.D on 10/10/2016 at 1:53 PM  Between 7am to 6pm - Pager - [phone]  After 6pm go to [email or web address] - password [email or web address] [facility]  Office   [phone]  CC: Primary care physician; [name], MD

## 2016-10-11 ENCOUNTER — Encounter: Payer: Self-pay | Admitting: *Deleted

## 2016-10-11 LAB — CBC
HCT: 22.4 % — ABNORMAL LOW (ref 40.0–52.0)
Hemoglobin: 7.5 g/dL — ABNORMAL LOW (ref 13.0–18.0)
MCH: 29.8 pg (ref 26.0–34.0)
MCHC: 33.4 g/dL (ref 32.0–36.0)
MCV: 89.2 fL (ref 80.0–100.0)
PLATELETS: 167 10*3/uL (ref 150–440)
RBC: 2.51 MIL/uL — AB (ref 4.40–5.90)
RDW: 15.3 % — AB (ref 11.5–14.5)
WBC: 7.3 10*3/uL (ref 3.8–10.6)

## 2016-10-11 LAB — GLUCOSE, CAPILLARY
GLUCOSE-CAPILLARY: 283 mg/dL — AB (ref 65–99)
GLUCOSE-CAPILLARY: 253 mg/dL — AB (ref 65–99)
Glucose-Capillary: 270 mg/dL — ABNORMAL HIGH (ref 65–99)
Glucose-Capillary: 253 mg/dL — ABNORMAL HIGH (ref 65–99)

## 2016-10-11 LAB — PREPARE RBC (CROSSMATCH)

## 2016-10-11 LAB — HEMOGLOBIN AND HEMATOCRIT, BLOOD
HCT: 27.8 % — ABNORMAL LOW (ref 40.0–52.0)
Hemoglobin: 9.3 g/dL — ABNORMAL LOW (ref 13.0–18.0)

## 2016-10-11 LAB — PROCALCITONIN: PROCALCITONIN: 0.2 ng/mL

## 2016-10-11 MED ORDER — INSULIN ASPART 100 UNIT/ML ~~LOC~~ SOLN
5.0000 [IU] | Freq: Three times a day (TID) | SUBCUTANEOUS | Status: DC
  Administered 2016-10-11 – 2016-10-12 (×3): 5 [IU] via SUBCUTANEOUS
  Filled 2016-10-11: qty 5

## 2016-10-11 MED ORDER — METOPROLOL TARTRATE 25 MG PO TABS
12.5000 mg | ORAL_TABLET | Freq: Two times a day (BID) | ORAL | Status: DC
  Administered 2016-10-11 – 2016-10-12 (×3): 12.5 mg via ORAL
  Filled 2016-10-11 (×3): qty 1

## 2016-10-11 MED ORDER — SODIUM CHLORIDE 0.9 % IV SOLN
Freq: Once | INTRAVENOUS | Status: AC
  Administered 2016-10-11: 11:00:00 via INTRAVENOUS

## 2016-10-11 MED ORDER — SODIUM CHLORIDE 0.9 % IV SOLN
150.0000 mg | Freq: Once | INTRAVENOUS | Status: AC
  Administered 2016-10-11: 150 mg via INTRAVENOUS
  Filled 2016-10-11: qty 7.5

## 2016-10-11 NOTE — Care Management Note (Addendum)
Case Management Note  Patient Details  Name: [name] MRN: [id number] Date of Birth: [birth year]  Subjective/Objective:   discharging today or in the am depending on HGB level.   Action/Plan: [facility] notified of discharge and resumption of care need. Dr. Posey Pronto willing to sign home health orders.  Patient updated.         Expected Discharge Date:                 Expected Discharge Plan:  [place]  In-House Referral:     Discharge planning Services  CM Consult  Post Acute Care Choice:  Resumption of Svcs/PTA Provider, Home Health Choice offered to:  Patient  DME Arranged:    DME Agency:     HH Arranged:  RN, PT HH Agency:  [facility]  Status of Service:  Completed, signed off  If discussed at [place] of Stay Meetings, dates discussed:    Additional Comments:  Jolly Mango, RN 10/11/2016, 2:17 PM

## 2016-10-11 NOTE — Progress Notes (Signed)
Nutrition Brief Note  Patient identified on the Malnutrition Screening Tool (MST) Report  Wt Readings from Last 15 Encounters:  10/09/16 270 lb (122.5 kg)  08/02/16 267 lb (121.1 kg)  07/26/16 283 lb (128.4 kg)  02/10/16 259 lb (117.5 kg)  10/25/15 265 lb (120.2 kg)  09/17/15 268 lb (121.6 kg)  08/20/15 289 lb 3.2 oz (131.2 kg)  07/27/15 272 lb (123.4 kg)  07/03/15 271 lb 1.6 oz (123 kg)  05/28/15 264 lb (119.7 kg)  05/23/15 273 lb 1.6 oz (123.9 kg)  05/11/15 262 lb (118.8 kg)  05/04/15 266 lb 1.6 oz (120.7 kg)  04/27/15 260 lb 3.2 oz (118 kg)  04/10/15 258 lb 12.8 oz (117.4 kg)   Body mass index is 34.2 kg/m. Patient meets criteria for obesity based on current BMI.   Current diet order is Heart Healthy, patient is consuming approximately 100% of meals at this time. Stable wts per chart. Labs and medications reviewed.   No nutrition interventions warranted at this time. If nutrition issues arise, please consult RD.   Koleen Distance, RD, LDN Pager #- [phone]

## 2016-10-11 NOTE — Evaluation (Signed)
Physical Therapy Evaluation Patient Details Name: [name] MRN: [id number] DOB: [birth year] Today's Date: 10/11/2016   History of Present Illness  Pt is a 75 y/o M who presented with SOB and weakness.  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    Clinical Impression  Pt admitted with above diagnosis. Pt currently with functional limitations due to the deficits listed below (see PT Problem List). Mr. Birchard presents from home generally deconditioned and only ambulating limited distances in his home.  He fatigues quickly while ambulating in room during today's session and additionally limited by onset of Bil hip pain which is his baseline.  SpO2 remained at or above 98% on 4L O2 during today's session, pt on 5L O2 at home.  Pt will benefit from skilled PT to increase their independence and safety with mobility to allow discharge to the venue listed below.      Follow Up Recommendations Home health PT    Equipment Recommendations  None recommended by PT    Recommendations for Other Services       Precautions / Restrictions Precautions Precautions: Fall;Other (comment) Precaution Comments: 5L O2 at baseline Restrictions Weight Bearing Restrictions: No      Mobility  Bed Mobility Overal bed mobility: Modified Independent             General bed mobility comments: Increased time and effort  Transfers Overall transfer level: Needs assistance Equipment used: None Transfers: Sit to/from Stand Sit to Stand: Min guard         General transfer comment: Min guard as pt demonstrates mild instability and reports dizziness upon standing which clears within 30 seconds.    Ambulation/Gait Ambulation/Gait assistance: Min guard Ambulation Distance (Feet): 50 Feet Assistive device: None Gait Pattern/deviations: Step-through pattern;Decreased stride  length;Staggering left;Staggering right Gait velocity: decreased Gait velocity interpretation: Below normal speed for age/gender General Gait Details: Pt with slight stagger as he maneuvers around objects in room and reports Bil hip pain toward end of ambulation (this is his baseline).  No LOB but unsteady.  SpO2 remains at or above 98% on 4L O2 throughout session.  Stairs            Wheelchair Mobility    Modified Rankin (Stroke Patients Only)       Balance Overall balance assessment: Needs assistance Sitting-balance support: No upper extremity supported;Feet supported Sitting balance-Leahy Scale: Good     Standing balance support: No upper extremity supported;During functional activity Standing balance-Leahy Scale: Fair                               Pertinent Vitals/Pain Pain Assessment: 0-10 Pain Score: 5  Pain Location: Bil feet due to neuropathy, Bil hips after ambulating Pain Descriptors / Indicators: Aching Pain Intervention(s): Limited activity within patient's tolerance;Monitored during session;Repositioned    Home Living Family/patient expects to be discharged to:: Private residence Living Arrangements: Spouse/significant other Available Help at Discharge: Family;Available 24 hours/day Type of Home: House Home Access: Stairs to enter Entrance Stairs-Rails: Chemical engineer of Steps: 5 Home Layout: One level Home Equipment: Environmental consultant - 2 wheels;Bedside commode;Cane - quad;Shower seat - built in      Prior Function Level of Independence: Independent         Comments: Pt denies any falls in the past 6 months.  He takes sponge baths.  He does not use AD to ambulate but is a limited household ambulator. He last received HHPT ~3 wks ago and since his strength and ambulatory endurance has declined.  Uses 5L O2 at home.     Hand Dominance   Dominant Hand: Right    Extremity/Trunk Assessment   Upper Extremity Assessment Upper  Extremity Assessment: Overall WFL for tasks assessed    Lower Extremity Assessment Lower Extremity Assessment: Overall WFL for tasks assessed       Communication   Communication: HOH  Cognition Arousal/Alertness: Awake/alert Behavior During Therapy: Flat affect Overall Cognitive Status: Within Functional Limits for tasks assessed                      General Comments General comments (skin integrity, edema, etc.): BP at start of session with pt supine 113/65.    Exercises General Exercises - Upper Extremity Shoulder Flexion: AROM;Both;10 reps;Seated General Exercises - Lower Extremity Ankle Circles/Pumps: AROM;Both;10 reps;Seated Quad Sets: Strengthening;Both;10 reps;Seated Long Arc Quad: Both;10 reps;Seated;Strengthening   Assessment/Plan    PT Assessment Patient needs continued PT services  PT Problem List Decreased activity tolerance;Decreased balance;Decreased knowledge of use of DME;Decreased safety awareness;Cardiopulmonary status limiting activity;Pain          PT Treatment Interventions DME instruction;Gait training;Stair training;Functional mobility training;Therapeutic activities;Therapeutic exercise;Balance training;Neuromuscular re-education;Patient/family education;Modalities    PT Goals (Current goals can be found in the Care Plan section)  Acute Rehab PT Goals Patient Stated Goal: to go home PT Goal Formulation: With patient Time For Goal Achievement: 10/25/16 Potential to Achieve Goals: Good    Frequency Min 2X/week   Barriers to discharge        Co-evaluation               End of Session Equipment Utilized During Treatment: Gait belt;Oxygen Activity Tolerance: Patient limited by fatigue;Patient limited by pain Patient left: in chair;with call bell/phone within reach;with chair alarm set Nurse Communication: Mobility status;Other (comment) (SpO2)         Time: [id number] PT Time Calculation (min) (ACUTE ONLY): 27  min   Charges:   PT Evaluation $PT Eval Low Complexity: 1 Procedure PT Treatments $Therapeutic Exercise: 8-22 mins   PT G Codes:        Collie Siad PT, DPT 10/11/2016, 2:06 PM

## 2016-10-11 NOTE — Progress Notes (Signed)
Inpatient Diabetes Program Recommendations  AACE/ADA: New Consensus Statement on Inpatient Glycemic Control (2015)  Target Ranges:  Prepandial:   less than 140 mg/dL      Peak postprandial:   less than 180 mg/dL (1-2 hours)      Critically ill patients:  140 - 180 mg/dL   Lab Results  Component Value Date   GLUCAP 253 (H) 10/11/2016   HGBA1C 7.0 (H) 07/24/2016   Results for KINGSLEE, ZALES (MRN [id number]) as of 10/11/2016 13:39  Ref. Range 10/10/2016 16:40 10/10/2016 21:05 10/11/2016 07:18 10/11/2016 11:15  Glucose-Capillary Latest Ref Range: 65 - 99 mg/dL 223 (H) 236 (H) 283 (H) 253 (H)   Review of Glycemic Control  Diabetes history:     DM, Obesity, BUN=31, Creatinine=1.23  Outpatient Diabetes medications:     Lantus 20 units BID, Novolog 10 units TIDAC meal coverage, Metformin 500 mg BID  Current orders for Inpatient glycemic control:     Lantus 20 units BID, Novolog 0-9 units TIDAC and 0-5 units QHS  Inpatient Diabetes Program Recommendations:    No CBG's below 223 this admission.  Patient now eating > 50% of meals consistently.      Please consider Novolog 5 units Meal coverage TIDAC if patient eats > 50% of meal.  Thank you,  Windy Carina, RN, MSN Diabetes Coordinator Inpatient Diabetes Program [phone] (Team Pager)

## 2016-10-11 NOTE — Progress Notes (Signed)
Pt did not sleep well tonight. Calls often for sugary food and drinks. Pt educated on diet. Refuses to adhere to diet.

## 2016-10-11 NOTE — Progress Notes (Signed)
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 AAA (abdominal aortic aneurysm) ([place])    a. 12/2008 s/p 7cm, endovascular repair with coiling right hypogastric artery   . Allergic rhinitis, cause unspecified   . Anxiety state 09/10/2013  . AVM (arteriovenous malformation) of colon with hemorrhage   . Bipolar 1 disorder, mixed, moderate ([place]) 04/16/2015  . CAD (coronary artery disease)    a. 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  . Chronic diastolic CHF (congestive heart failure) ([place])    a. 04/2015 Echo: EF 55-60%, no rwma, Gr 1 DD, mild AI.  Marland Kitchen COPD (chronic obstructive pulmonary disease) ([place])    a. GOLD stage IV, started home O2. Severe bullous disease of LUL. Prolonged intubation after surgeries due to COPD.  Marland Kitchen Depression 01/14/2013  . Diverticulosis   . Essential hypertension   . Essential hypertension 08/18/2009   Qualifier: Diagnosis of  By: Doy Mince LPN, Megan    . GI bleed requiring more than 4 units of blood in 24 hours, ICU, or surgery    a. Hx bleeding gastric polyps, cecal & sigmoid AVMS s/p APC 03/30/14  . Hyperlipidemia   . Insomnia 08/10/2014  . Leucocytosis 12/04/2013  . Memory loss   . Morbid obesity ([place])   . Multiple gastric polyps   . Recurrent Microcytic Anemia    a. presumed chronic GI blood loss.  . Type II  diabetes mellitus ([place])   . Vitamin D deficiency 08/10/2014    HOSPITAL COURSE:  75 year old Caucasian gentleman history of COPD 5 L oxygen baseline, chronic GI bleed presenting with dyspnea on exertion, weakness  1. Increasing sob and weakness due to acute on chronic anemia -no s/o infection or pneumonia -d/c IV abxs  2. Chronic GI bleed: Hemoglobin is 7.1  -will check iron levels given low MCV, high RDW patient will likely require iron supplementation again -transfuse 1 unit today Was 74.9---7.1--1 unit Bt--7.5--2nd unit today -cont po iron pills -pt will benefit from Hematology f/u -will give one dose of IV iron today -no BRBPR reported  3. Hyperlipidemia unspecified on statin therapy  4. Type 2 diabetes insulin requiring: Hold oral agents continue sliding so, for treatment of basal insulin  5. Essential tension: Lisinopril, Lopressor -BP soft today---decreased bb to 12.5 bid  PT recommends HHPT Cm for d/c planning Will d/c to home if hgb shows improvement CONSULTS OBTAINED:    DRUG ALLERGIES:   Allergies  Allergen Reactions  . Morphine And Related Shortness Of Breath, Nausea And Vomiting, Rash and Other (See Comments)    Reaction:  Hallucinations   . Penicillins Anaphylaxis, Hives and Other (See Comments)    Has patient had a PCN reaction causing immediate rash, facial/tongue/throat swelling, SOB or lightheadedness with hypotension: Yes Has patient had a PCN reaction causing severe rash involving mucus membranes or skin necrosis: No Has patient had a PCN reaction that  required hospitalization No Has patient had a PCN reaction occurring within the last 10 years: No If all of the above answers are "NO", then may proceed with Cephalosporin use.  . Demerol [Meperidine] Other (See Comments)    Reaction:  Hallucinations    . Dilaudid [Hydromorphone Hcl] Other (See Comments)    Reaction:  Hallucinations   . Levofloxacin Other (See Comments)    Reaction:  Unknown      DISCHARGE MEDICATIONS:   Current Discharge Medication List    CONTINUE these medications which have NOT CHANGED   Details  acetaminophen (TYLENOL) 325 MG tablet Take 650 mg by mouth every 6 (six) hours as needed for mild pain, fever or headache.     albuterol (PROVENTIL HFA;VENTOLIN HFA) 108 (90 BASE) MCG/ACT inhaler Inhale 2 puffs into the lungs every 6 (six) hours as needed for wheezing or shortness of breath. Qty: 1 Inhaler, Refills: 2    ALPRAZolam (XANAX) 0.25 MG tablet Take 0.5 mg by mouth 3 (three) times daily as needed for anxiety or sleep.    atorvastatin (LIPITOR) 40 MG tablet Take 40 mg by mouth at bedtime.    budesonide-formoterol (SYMBICORT) 160-4.5 MCG/ACT inhaler Inhale 2 puffs into the lungs 2 (two) times daily. Qty: 1 Inhaler, Refills: 5    carbamide peroxide (DEBROX) 6.5 % otic solution Place 5 drops into both ears 2 (two) times daily. Qty: 15 mL, Refills: 0    cholecalciferol (VITAMIN D) 1000 UNITS tablet Take 1,000 Units by mouth daily.    citalopram (CELEXA) 40 MG tablet Take 40 mg by mouth daily.    ferrous sulfate 325 (65 FE) MG tablet Take 325 mg by mouth 3 (three) times daily with meals.     gabapentin (NEURONTIN) 300 MG capsule Take 300 mg by mouth 3 (three) times daily.    insulin aspart (NOVOLOG) 100 UNIT/ML injection Inject 10 Units into the skin 3 (three) times daily before meals.    insulin glargine (LANTUS) 100 UNIT/ML injection Inject 0.2 mLs (20 Units total) into the skin 2 (two) times daily. Qty: 10 mL, Refills: 1    lidocaine (XYLOCAINE) 5 % ointment Apply 1 application topically as needed for mild pain.    lisinopril (PRINIVIL,ZESTRIL) 2.5 MG tablet Take 2.5 mg by mouth daily.    metFORMIN (GLUCOPHAGE) 500 MG tablet Take 500 mg by mouth 2 (two) times daily with a meal.    metoprolol tartrate (LOPRESSOR) 25 MG tablet Take 25 mg by mouth 2 (two) times daily.    pantoprazole (PROTONIX) 40 MG tablet Take 40 mg by mouth daily.     sucralfate (CARAFATE) 1 G tablet Take 1 g by mouth 4 (four) times daily -  with meals and at bedtime.    tiotropium (SPIRIVA) 18 MCG inhalation capsule Place 18 mcg into inhaler and inhale daily.    torsemide (DEMADEX) 20 MG tablet Take 1 tablet (20 mg total) by mouth 2 (two) times daily. Qty: 60 tablet, Refills: 1    traMADol-acetaminophen (ULTRACET) 37.5-325 MG tablet Take 1 tablet by mouth 2 (two) times daily as needed for moderate pain.    zolpidem (AMBIEN) 5 MG tablet Take 1 tablet (5 mg total) by mouth at bedtime as needed for sleep. Qty: 30 tablet, Refills: 0    Umeclidinium Bromide (INCRUSE ELLIPTA) 62.5 MCG/INH AEPB Inhale 1 puff into the lungs daily. Qty: 1 each, Refills: 0        If you experience worsening of your admission symptoms, develop shortness of breath, life  threatening emergency, suicidal or homicidal thoughts you must seek medical attention immediately by calling 911 or calling your MD immediately  if symptoms less severe.  You Must read complete instructions/literature along with all the possible adverse reactions/side effects for all the Medicines you take and that have been prescribed to you. Take any new Medicines after you have completely understood and accept all the possible adverse reactions/side effects.   Please note  You were cared for by a hospitalist during your hospital stay. If you have any questions about your discharge medications or the care you received while you were in the hospital after you are discharged, you can call the unit and asked to speak with the hospitalist on call if the hospitalist that took care of you is not available. Once you are discharged, your primary care physician will handle any further medical issues. Please note that NO REFILLS for any discharge medications will be authorized once you are discharged, as it is imperative that you return to your primary care physician (or establish a relationship with a primary care  physician if you do not have one) for your aftercare needs so that they can reassess your need for medications and monitor your lab values. Today   SUBJECTIVE   Doing ok C/o chronic sob weakness  VITAL SIGNS:  Blood pressure 99/62, pulse 80, temperature 98.8 F (37.1 C), temperature source Oral, resp. rate 20, weight 122.5 kg (270 lb), SpO2 98 %.  I/O:   Intake/Output Summary (Last 24 hours) at 10/11/16 1416 Last data filed at 10/11/16 1230  Gross per 24 hour  Intake              545 ml  Output              751 ml  Net             -206 ml    PHYSICAL EXAMINATION:  GENERAL:  75 y.o.-year-old patient lying in the bed with no acute distress. Obese. Appears ill chronically EYES: Pupils equal, round, reactive to light and accommodation. No scleral icterus. Extraocular muscles intact.  HEENT: Head atraumatic, normocephalic. Oropharynx and nasopharynx clear. [place] oxygen NECK:  Supple, no jugular venous distention. No thyroid enlargement, no tenderness.  LUNGS: distant breath sounds bilaterally, no wheezing, rales,rhonchi or crepitation. No use of accessory muscles of respiration.  CARDIOVASCULAR: S1, S2 normal. No murmurs, rubs, or gallops.  ABDOMEN: Soft, non-tender, non-distended. Bowel sounds present. No organomegaly or mass.  EXTREMITIES: No pedal edema, cyanosis, or clubbing.  NEUROLOGIC: Cranial nerves II through XII are intact. Muscle strength 5/5 in all extremities. Sensation intact. Gait not checked.  PSYCHIATRIC: The patient is alert and oriented x 3.  SKIN: No obvious rash, lesion, or ulcer.   DATA REVIEW:   CBC   Recent Labs Lab 10/11/16 0509  WBC 7.3  HGB 7.5*  HCT 22.4*  PLT 167    Chemistries   Recent Labs Lab 10/10/16 0329  NA 134*  K 4.3  CL 103  CO2 26  GLUCOSE 306*  BUN 31*  CREATININE 1.23  CALCIUM 8.1*    Microbiology Results   Recent Results (from the past 240 hour(s))  Blood culture (routine x 2)     Status: None (Preliminary result)    Collection Time: 10/09/16  9:09 AM  Result Value Ref Range Status   Specimen Description BLOOD LEFT ARM  Final   Special Requests   Final    BOTTLES DRAWN AEROBIC AND ANAEROBIC AER 11  ML ANA 10 ML   Culture NO GROWTH 2 DAYS  Final   Report Status PENDING  Incomplete  Blood culture (routine x 2)     Status: None (Preliminary result)   Collection Time: 10/09/16  9:09 AM  Result Value Ref Range Status   Specimen Description BLOOD RIGHT ARM  Final   Special Requests   Final    BOTTLES DRAWN AEROBIC AND ANAEROBIC AER 11 ML ANA 9 ML   Culture NO GROWTH 2 DAYS  Final   Report Status PENDING  Incomplete  MRSA PCR Screening     Status: None   Collection Time: 10/09/16 12:31 PM  Result Value Ref Range Status   MRSA by PCR NEGATIVE NEGATIVE Final    Comment:        The GeneXpert MRSA Assay (FDA approved for NASAL specimens only), is one component of a comprehensive MRSA colonization surveillance program. It is not intended to diagnose MRSA infection nor to guide or monitor treatment for MRSA infections.     RADIOLOGY:  No results found.   Management plans discussed with the patient, family and they are in agreement.  CODE STATUS:     Code Status Orders        Start     Ordered   10/09/16 1153  Full code  Continuous     10/09/16 1152    Code Status History    Date Active Date Inactive Code Status Order ID Comments User Context   10/09/2016 11:05 AM 10/09/2016 11:52 AM DNR [id number]  Lytle Butte, MD ED   07/24/2016  8:06 AM 07/27/2016  5:59 PM DNR [id number]  Harrie Foreman, MD Inpatient   02/10/2016  3:28 PM 02/12/2016  8:09 PM DNR [id number]  Loletha Grayer, MD ED   10/26/2015  3:14 AM 11/01/2015  8:05 PM Full Code [id number]  Saundra Shelling, MD Inpatient   08/20/2015  4:41 AM 08/22/2015  4:49 PM Full Code [id number]  Lytle Butte, MD ED   08/20/2015  2:54 AM 08/20/2015  4:41 AM Full Code [id number]  Lytle Butte, MD ED   07/27/2015  8:40 PM 07/29/2015  6:29 PM Full Code  [id number]  Lytle Butte, MD ED   06/30/2015 12:40 PM 07/03/2015  3:26 PM Full Code [id number]  Bettey Costa, MD Inpatient   05/19/2015  2:59 PM 05/23/2015  4:21 PM Full Code [id number]  Max Sane, MD Inpatient   05/06/2015  5:14 PM 05/13/2015  5:06 PM Full Code [id number]  Epifanio Lesches, MD ED   05/03/2015 12:29 PM 05/04/2015  7:20 PM Full Code [id number]  Idelle Crouch, MD Inpatient   04/22/2015  4:50 AM 04/27/2015  7:35 PM Full Code [id number]  Harrie Foreman, MD Inpatient   04/17/2015  7:16 PM 04/19/2015  5:16 PM Full Code [id number]  Clovis Fredrickson, MD Inpatient   04/10/2015  9:12 AM 04/17/2015  7:08 PM Full Code [id number]  Demetrios Loll, MD Inpatient   03/09/2015 12:18 PM 03/12/2015  6:45 PM Full Code [id number]  Henreitta Leber, MD Inpatient   08/24/2014  9:41 PM 08/25/2014  5:20 PM Full Code [id number]  Ivor Costa, MD ED   04/02/2014  8:11 PM 04/04/2014  5:10 PM Full Code [id number]  Velvet Bathe, MD Inpatient   03/18/2014  3:44 PM 03/22/2014  2:06 PM Full Code [id number]  Verlee Monte, MD Inpatient   12/04/2013  6:13 AM 12/10/2013  7:54 PM Full Code [id number]  Rise Patience, MD Inpatient  10/15/2013  8:11 PM 10/18/2013  6:40 PM Full Code [id number]  Fuller Plan, MD Inpatient   09/08/2013 12:43 AM 09/10/2013  3:30 PM Full Code [id number]  Toy Baker, MD Inpatient   08/19/2013  5:48 PM 08/22/2013  3:51 PM Full Code [id number]  Orson Eva, MD ED   05/15/2013  1:26 AM 05/17/2013  4:59 PM Full Code [id number]  Theodis Blaze, MD Inpatient   12/04/2012  5:23 PM 12/08/2012  4:56 PM Full Code [id number]  Janece Canterbury, MD Inpatient   05/17/2012 12:56 PM 05/21/2012  6:09 PM Full Code [id number]  Campbell Lerner, RN ED   04/06/2012  7:35 PM 04/14/2012  5:23 PM Full Code [id number]  Marylou Mccoy, RN Inpatient   03/27/2012  4:44 AM 03/28/2012  5:56 PM Full Code [id number]  Theotis Barrio, RN Inpatient      TOTAL TIME TAKING CARE OF THIS PATIENT: 40 minutes.    Khaleesi Gruel M.D on 10/11/2016 at 2:16 PM  Between 7am to 6pm -  Pager - [phone] After 6pm go to [email or web address] - password [email or web address] [facility]  Office  [phone]  CC: Primary care physician; [name], MD

## 2016-10-11 NOTE — Progress Notes (Signed)
[facility] [place] NAME: [name]    MR#:  [id number]  DATE OF BIRTH:  [birth year]  SUBJECTIVE:   Came in with increasing sob and weakness. Found to have anemia Pt noted to be noncompliant with nursing staff regarding safety issue and trying to get up w/o assistance. He is at fall risk. No active bleeding. Chronic sob. sats 100% on 5liters  REVIEW OF SYSTEMS:   Review of Systems  Constitutional: Negative for chills, fever and weight loss.  HENT: Negative for ear discharge, ear pain and nosebleeds.   Eyes: Negative for blurred vision, pain and discharge.  Respiratory: Negative for sputum production, shortness of breath, wheezing and stridor.   Cardiovascular: Negative for chest pain, palpitations, orthopnea and PND.  Gastrointestinal: Negative for abdominal pain, diarrhea, nausea and vomiting.  Genitourinary: Negative for frequency and urgency.  Musculoskeletal: Negative for back pain and joint pain.  Neurological: Negative for sensory change, speech change, focal weakness and weakness.  Psychiatric/Behavioral: Negative for depression and hallucinations. The patient is not nervous/anxious.    Tolerating Diet:yes Tolerating PT: pending  DRUG ALLERGIES:   Allergies  Allergen Reactions  . Morphine And Related Shortness Of Breath, Nausea And Vomiting, Rash and Other (See Comments)    Reaction:  Hallucinations   . Penicillins Anaphylaxis, Hives and Other (See Comments)    Has patient had a PCN reaction causing immediate rash, facial/tongue/throat swelling, SOB or lightheadedness with hypotension: Yes Has patient had a PCN reaction causing severe rash involving mucus membranes or skin necrosis: No Has patient had a PCN reaction that required hospitalization No Has patient had a PCN reaction occurring within the last 10 years: No If all of the above answers are "NO", then may proceed with Cephalosporin use.  . Demerol  [Meperidine] Other (See Comments)    Reaction:  Hallucinations    . Dilaudid [Hydromorphone Hcl] Other (See Comments)    Reaction:  Hallucinations   . Levofloxacin Other (See Comments)    Reaction:  Unknown     VITALS:  Blood pressure (!) 96/50, pulse 69, temperature 98 F (36.7 C), temperature source Oral, resp. rate 18, weight 122.5 kg (270 lb), SpO2 100 %.  PHYSICAL EXAMINATION:   Physical Exam  GENERAL:  75 y.o.-year-old patient lying in the bed with no acute distress. obese EYES: Pupils equal, round, reactive to light and accommodation. No scleral icterus. Extraocular muscles intact.  HEENT: Head atraumatic, normocephalic. Oropharynx and nasopharynx clear.  NECK:  Supple, no jugular venous distention. No thyroid enlargement, no tenderness.  LUNGS: distant breath sounds bilaterally, no wheezing, rales, rhonchi. No use of accessory muscles of respiration.  CARDIOVASCULAR: S1, S2 normal. No murmurs, rubs, or gallops.  ABDOMEN: Soft, nontender, nondistended. Bowel sounds present. No organomegaly or mass.  EXTREMITIES: No cyanosis, clubbing or edema b/l.    NEUROLOGIC: Cranial nerves II through XII are intact. No focal Motor or sensory deficits b/l.   PSYCHIATRIC:  patient is alert and oriented x 3.  SKIN: No obvious rash, lesion, or ulcer.   LABORATORY PANEL:  CBC  Recent Labs Lab 10/11/16 0509  WBC 7.3  HGB 7.5*  HCT 22.4*  PLT 167    Chemistries   Recent Labs Lab 10/10/16 0329  NA 134*  K 4.3  CL 103  CO2 26  GLUCOSE 306*  BUN 31*  CREATININE 1.23  CALCIUM 8.1*   Cardiac Enzymes  Recent Labs Lab 10/09/16 0625  TROPONINI <0.03   RADIOLOGY:  No results found. ASSESSMENT AND PLAN:   75 year old Caucasian gentleman history of COPD 5 L oxygen baseline, chronic GI bleed presenting with dyspnea on exertion, weakness  1. Increasing sob and weakness due to acute on chornic anemia -no s/o infection or pneumonia -d/c IV abxs  2. Chronic GI bleed:  Hemoglobin is 7.1  -will check iron levels given low MCV, high RDW patient will likely require iron supplementation again -transfuse 1 unit today Was 8.9---7.1--1 unit Bt--7.5--2nd unit today -cont po iron pills -pt will benefit from Hematology f/u -will give one dose of IV iron today -no BRBPR reported  3. Hyperlipidemia unspecified on statin therapy  4. Type 2 diabetes insulin requiring: Hold oral agents continue sliding so, for treatment of basal insulin  5. Essential tension: Lisinopril, Lopressor -BP soft today---decreased bb to 12.5 bid  PT to see today. Cm for d/c planning  According to the records pt has been either fired by [employer] and is not able to maintain long term relationship with MD's /PCp's and having issues with f/u.  D/w with him plan about transfusing 2nd unit of blood and if remains stable for d/c today. He was irritable and wants to stay till he feels better. Explained to him he has other chronic issues which are stable and will arrange hhPt to help with his PT. Also discussed whether he would consider rehab if appropriate and he declined.  Case discussed with Care Management/Social Worker. Management plans discussed with the patient, family and they are in agreement.  CODE STATUS:full DVT Prophylaxis: scd TOTAL TIME TAKING CARE OF THIS PATIENT: 30 minutes.  >50% time spent on counselling and coordination of care  POSSIBLE D/C IN 1 DAYS, DEPENDING ON CLINICAL CONDITION.  Note: This dictation was prepared with Dragon dictation along with smaller phrase technology. Any transcriptional errors that result from this process are unintentional.  Leily Capek M.D on 10/11/2016 at 7:29 AM  Between 7am to 6pm - Pager - [phone]  After 6pm go to [email or web address] - password [email or web address] [facility]  Office  [phone]  CC: Primary care physician; [name], MD

## 2016-10-12 LAB — GLUCOSE, CAPILLARY
Glucose-Capillary: 191 mg/dL — ABNORMAL HIGH (ref 65–99)
Glucose-Capillary: 301 mg/dL — ABNORMAL HIGH (ref 65–99)

## 2016-10-12 LAB — TYPE AND SCREEN
ABO/RH(D): B NEG
ANTIBODY SCREEN: NEGATIVE
UNIT DIVISION: 0
Unit division: 0

## 2016-10-12 NOTE — Progress Notes (Signed)
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 AAA (abdominal aortic aneurysm) ([place])    a. 12/2008 s/p 7cm, endovascular repair with coiling right hypogastric artery   . Allergic rhinitis, cause unspecified   . Anxiety state 09/10/2013  . AVM (arteriovenous malformation) of colon with hemorrhage   . Bipolar 1 disorder, mixed, moderate ([place]) 04/16/2015  . CAD (coronary artery disease)    a. 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  . Chronic diastolic CHF (congestive heart failure) ([place])    a. 04/2015 Echo: EF 55-60%, no rwma, Gr 1 DD, mild AI.  Marland Kitchen COPD (chronic obstructive pulmonary disease) ([place])    a. GOLD stage IV, started home O2. Severe bullous disease of LUL. Prolonged intubation after surgeries due to COPD.  Marland Kitchen Depression 01/14/2013  . Diverticulosis   . Essential hypertension   . Essential hypertension 08/18/2009   Qualifier: Diagnosis of  By: Doy Mince LPN, Megan    . GI bleed requiring more than 4 units of blood in 24 hours, ICU, or surgery    a. Hx bleeding gastric polyps, cecal & sigmoid AVMS s/p APC 03/30/14  . Hyperlipidemia   . Insomnia 08/10/2014  . Leucocytosis 12/04/2013  . Memory loss   . Morbid obesity ([place])   . Multiple gastric polyps   . Recurrent Microcytic Anemia    a. presumed chronic GI blood loss.  . Type II  diabetes mellitus ([place])   . Vitamin D deficiency 08/10/2014    HOSPITAL COURSE:  75 year old Caucasian gentleman history of COPD 5 L oxygen baseline, chronic GI bleed presenting with dyspnea on exertion, weakness  1. Increasing sob and weakness due to acute on chronic anemia -no s/o infection or pneumonia -d/c IV abxs  2. Chronic GI bleed: Hemoglobin is 7.1  -will check iron levels given low MCV, high RDW patient will likely require iron supplementation again -transfuse 1 unit today Was 8.9---7.1--1 unit Bt--7.5--2nd unit today--9.3 -cont po iron pills -pt will benefit from Hematology f/u -will get appt with dr Grayland Ormond. Pt informed -will give one dose of IV iron 10/11/16 -no BRBPR reported  3. Hyperlipidemia unspecified on statin therapy  4. Type 2 diabetes insulin requiring: Hold oral agents continue sliding so, for treatment of basal insulin  5. Essential tension: Lisinopril, Lopressor -BP soft today---decreased bb to 12.5 bid  PT recommends HHPT Cm for d/c planning Will d/c to home today. Pt agreeable CONSULTS OBTAINED:    DRUG ALLERGIES:   Allergies  Allergen Reactions  . Morphine And Related Shortness Of Breath, Nausea And Vomiting, Rash and Other (See Comments)    Reaction:  Hallucinations   . Penicillins Anaphylaxis, Hives and Other (See Comments)    Has patient had a PCN reaction causing immediate rash, facial/tongue/throat swelling, SOB or lightheadedness with hypotension: Yes Has patient had a PCN reaction causing severe rash involving mucus membranes or skin necrosis: No  Has patient had a PCN reaction that required hospitalization No Has patient had a PCN reaction occurring within the last 10 years: No If all of the above answers are "NO", then may proceed with Cephalosporin use.  . Demerol [Meperidine] Other (See Comments)    Reaction:  Hallucinations    . Dilaudid [Hydromorphone Hcl] Other (See Comments)    Reaction:  Hallucinations   .  Levofloxacin Other (See Comments)    Reaction:  Unknown     DISCHARGE MEDICATIONS:   Current Discharge Medication List    CONTINUE these medications which have NOT CHANGED   Details  acetaminophen (TYLENOL) 325 MG tablet Take 650 mg by mouth every 6 (six) hours as needed for mild pain, fever or headache.     albuterol (PROVENTIL HFA;VENTOLIN HFA) 108 (90 BASE) MCG/ACT inhaler Inhale 2 puffs into the lungs every 6 (six) hours as needed for wheezing or shortness of breath. Qty: 1 Inhaler, Refills: 2    ALPRAZolam (XANAX) 0.25 MG tablet Take 0.5 mg by mouth 3 (three) times daily as needed for anxiety or sleep.    atorvastatin (LIPITOR) 40 MG tablet Take 40 mg by mouth at bedtime.    budesonide-formoterol (SYMBICORT) 160-4.5 MCG/ACT inhaler Inhale 2 puffs into the lungs 2 (two) times daily. Qty: 1 Inhaler, Refills: 5    carbamide peroxide (DEBROX) 6.5 % otic solution Place 5 drops into both ears 2 (two) times daily. Qty: 15 mL, Refills: 0    cholecalciferol (VITAMIN D) 1000 UNITS tablet Take 1,000 Units by mouth daily.    citalopram (CELEXA) 40 MG tablet Take 40 mg by mouth daily.    ferrous sulfate 325 (65 FE) MG tablet Take 325 mg by mouth 3 (three) times daily with meals.     gabapentin (NEURONTIN) 300 MG capsule Take 300 mg by mouth 3 (three) times daily.    insulin aspart (NOVOLOG) 100 UNIT/ML injection Inject 10 Units into the skin 3 (three) times daily before meals.    insulin glargine (LANTUS) 100 UNIT/ML injection Inject 0.2 mLs (20 Units total) into the skin 2 (two) times daily. Qty: 10 mL, Refills: 1    lidocaine (XYLOCAINE) 5 % ointment Apply 1 application topically as needed for mild pain.    lisinopril (PRINIVIL,ZESTRIL) 2.5 MG tablet Take 2.5 mg by mouth daily.    metFORMIN (GLUCOPHAGE) 500 MG tablet Take 500 mg by mouth 2 (two) times daily with a meal.    metoprolol tartrate (LOPRESSOR) 25 MG tablet Take 25 mg by mouth 2 (two) times daily.    pantoprazole  (PROTONIX) 40 MG tablet Take 40 mg by mouth daily.    sucralfate (CARAFATE) 1 G tablet Take 1 g by mouth 4 (four) times daily -  with meals and at bedtime.    tiotropium (SPIRIVA) 18 MCG inhalation capsule Place 18 mcg into inhaler and inhale daily.    torsemide (DEMADEX) 20 MG tablet Take 1 tablet (20 mg total) by mouth 2 (two) times daily. Qty: 60 tablet, Refills: 1    traMADol-acetaminophen (ULTRACET) 37.5-325 MG tablet Take 1 tablet by mouth 2 (two) times daily as needed for moderate pain.    zolpidem (AMBIEN) 5 MG tablet Take 1 tablet (5 mg total) by mouth at bedtime as needed for sleep. Qty: 30 tablet, Refills: 0    Umeclidinium Bromide (INCRUSE ELLIPTA) 62.5 MCG/INH AEPB Inhale 1 puff into the lungs daily. Qty: 1 each, Refills: 0        If you experience worsening of your  admission symptoms, develop shortness of breath, life threatening emergency, suicidal or homicidal thoughts you must seek medical attention immediately by calling 911 or calling your MD immediately  if symptoms less severe.  You Must read complete instructions/literature along with all the possible adverse reactions/side effects for all the Medicines you take and that have been prescribed to you. Take any new Medicines after you have completely understood and accept all the possible adverse reactions/side effects.   Please note  You were cared for by a hospitalist during your hospital stay. If you have any questions about your discharge medications or the care you received while you were in the hospital after you are discharged, you can call the unit and asked to speak with the hospitalist on call if the hospitalist that took care of you is not available. Once you are discharged, your primary care physician will handle any further medical issues. Please note that NO REFILLS for any discharge medications will be authorized once you are discharged, as it is imperative that you return to your primary care physician (or  establish a relationship with a primary care physician if you do not have one) for your aftercare needs so that they can reassess your need for medications and monitor your lab values. Today   SUBJECTIVE   Doing ok C/o chronic sob weakness  VITAL SIGNS:  Blood pressure (!) 123/56, pulse (!) 101, temperature 98.6 F (37 C), temperature source Oral, resp. rate 19, weight 122.5 kg (270 lb), SpO2 99 %.  I/O:    Intake/Output Summary (Last 24 hours) at 10/12/16 0948 Last data filed at 10/12/16 0800  Gross per 24 hour  Intake             1025 ml  Output             2500 ml  Net            -1475 ml    PHYSICAL EXAMINATION:  GENERAL:  75 y.o.-year-old patient lying in the bed with no acute distress. Obese. Appears ill chronically EYES: Pupils equal, round, reactive to light and accommodation. No scleral icterus. Extraocular muscles intact.  HEENT: Head atraumatic, normocephalic. Oropharynx and nasopharynx clear. [place] oxygen NECK:  Supple, no jugular venous distention. No thyroid enlargement, no tenderness.  LUNGS: distant breath sounds bilaterally, no wheezing, rales,rhonchi or crepitation. No use of accessory muscles of respiration.  CARDIOVASCULAR: S1, S2 normal. No murmurs, rubs, or gallops.  ABDOMEN: Soft, non-tender, non-distended. Bowel sounds present. No organomegaly or mass.  EXTREMITIES: No pedal edema, cyanosis, or clubbing.  NEUROLOGIC: Cranial nerves II through XII are intact. Muscle strength 5/5 in all extremities. Sensation intact. Gait not checked.  PSYCHIATRIC: The patient is alert and oriented x 3.  SKIN: No obvious rash, lesion, or ulcer.   DATA REVIEW:   CBC   Recent Labs Lab 10/11/16 0509 10/11/16 1448  WBC 7.3  --   HGB 7.5* 9.3*  HCT 22.4* 27.8*  PLT 167  --     Chemistries   Recent Labs Lab 10/10/16 0329  NA 134*  K 4.3  CL 103  CO2 26  GLUCOSE 306*  BUN 31*  CREATININE 1.23  CALCIUM 8.1*    Microbiology Results   Recent Results (from  the past 240 hour(s))  Blood culture (routine x 2)     Status: None (Preliminary result)   Collection Time: 10/09/16  9:09 AM  Result Value Ref Range Status   Specimen Description BLOOD LEFT ARM  Final  Special Requests   Final    BOTTLES DRAWN AEROBIC AND ANAEROBIC AER 11 ML ANA 10 ML   Culture NO GROWTH 3 DAYS  Final   Report Status PENDING  Incomplete  Blood culture (routine x 2)     Status: None (Preliminary result)   Collection Time: 10/09/16  9:09 AM  Result Value Ref Range Status   Specimen Description BLOOD RIGHT ARM  Final   Special Requests   Final    BOTTLES DRAWN AEROBIC AND ANAEROBIC AER 11 ML ANA 9 ML   Culture NO GROWTH 3 DAYS  Final   Report Status PENDING  Incomplete  MRSA PCR Screening     Status: None   Collection Time: 10/09/16 12:31 PM  Result Value Ref Range Status   MRSA by PCR NEGATIVE NEGATIVE Final    Comment:        The GeneXpert MRSA Assay (FDA approved for NASAL specimens only), is one component of a comprehensive MRSA colonization surveillance program. It is not intended to diagnose MRSA infection nor to guide or monitor treatment for MRSA infections.     RADIOLOGY:  No results found.   Management plans discussed with the patient, family and they are in agreement.  CODE STATUS:     Code Status Orders        Start     Ordered   10/09/16 1153  Full code  Continuous     10/09/16 1152    Code Status History    Date Active Date Inactive Code Status Order ID Comments User Context   10/09/2016 11:05 AM 10/09/2016 11:52 AM DNR [id number]  Lytle Butte, MD ED   07/24/2016  8:06 AM 07/27/2016  5:59 PM DNR [id number]  Harrie Foreman, MD Inpatient   02/10/2016  3:28 PM 02/12/2016  8:09 PM DNR [id number]  Loletha Grayer, MD ED   10/26/2015  3:14 AM 11/01/2015  8:05 PM Full Code [id number]  Saundra Shelling, MD Inpatient   08/20/2015  4:41 AM 08/22/2015  4:49 PM Full Code [id number]  Lytle Butte, MD ED   08/20/2015  2:54 AM 08/20/2015  4:41 AM Full Code  [id number]  Lytle Butte, MD ED   07/27/2015  8:40 PM 07/29/2015  6:29 PM Full Code [id number]  Lytle Butte, MD ED   06/30/2015 12:40 PM 07/03/2015  3:26 PM Full Code [id number]  Bettey Costa, MD Inpatient   05/19/2015  2:59 PM 05/23/2015  4:21 PM Full Code [id number]  Max Sane, MD Inpatient   05/06/2015  5:14 PM 05/13/2015  5:06 PM Full Code [id number]  Epifanio Lesches, MD ED   05/03/2015 12:29 PM 05/04/2015  7:20 PM Full Code [id number]  Idelle Crouch, MD Inpatient   04/22/2015  4:50 AM 04/27/2015  7:35 PM Full Code [id number]  Harrie Foreman, MD Inpatient   04/17/2015  7:16 PM 04/19/2015  5:16 PM Full Code [id number]  Clovis Fredrickson, MD Inpatient   04/10/2015  9:12 AM 04/17/2015  7:08 PM Full Code [id number]  Demetrios Loll, MD Inpatient   03/09/2015 12:18 PM 03/12/2015  6:45 PM Full Code [id number]  Henreitta Leber, MD Inpatient   08/24/2014  9:41 PM 08/25/2014  5:20 PM Full Code [id number]  Ivor Costa, MD ED   04/02/2014  8:11 PM 04/04/2014  5:10 PM Full Code [id number]  Velvet Bathe, MD Inpatient   03/18/2014  3:44 PM 03/22/2014  2:06 PM Full Code [id number]  Verlee Monte, MD Inpatient   12/04/2013  6:13  AM 12/10/2013  7:54 PM Full Code [id number]  Rise Patience, MD Inpatient   10/15/2013  8:11 PM 10/18/2013  6:40 PM Full Code [id number]  Fuller Plan, MD Inpatient   09/08/2013 12:43 AM 09/10/2013  3:30 PM Full Code [id number]  Toy Baker, MD Inpatient   08/19/2013  5:48 PM 08/22/2013  3:51 PM Full Code [id number]  Orson Eva, MD ED   05/15/2013  1:26 AM 05/17/2013  4:59 PM Full Code [id number]  Theodis Blaze, MD Inpatient   12/04/2012  5:23 PM 12/08/2012  4:56 PM Full Code [id number]  Janece Canterbury, MD Inpatient   05/17/2012 12:56 PM 05/21/2012  6:09 PM Full Code [id number]  Campbell Lerner, RN ED   04/06/2012  7:35 PM 04/14/2012  5:23 PM Full Code [id number]  Marylou Mccoy, RN Inpatient   03/27/2012  4:44 AM 03/28/2012  5:56 PM Full Code [id number]  Theotis Barrio, RN Inpatient      TOTAL TIME TAKING CARE OF THIS PATIENT:  40 minutes.    Jaycelynn Knickerbocker M.D on 10/12/2016 at 9:48 AM  Between 7am to 6pm - Pager - [phone] After 6pm go to [email or web address] - password [email or web address] [facility]  Office  [phone]  CC: Primary care physician; [name], MD

## 2016-10-12 NOTE — Progress Notes (Addendum)
Inpatient Diabetes Program Recommendations  AACE/ADA: New Consensus Statement on Inpatient Glycemic Control (2015)  Target Ranges:  Prepandial:   less than 140 mg/dL      Peak postprandial:   less than 180 mg/dL (1-2 hours)      Critically ill patients:  140 - 180 mg/dL   Lab Results  Component Value Date   GLUCAP 191 (H) 10/12/2016   HGBA1C 7.0 (H) 07/24/2016   Results for JERYN, GALLIGHER (MRN [id number]) as of 10/12/2016 09:02  Ref. Range 10/11/2016 07:18 10/11/2016 11:15 10/11/2016 16:41 10/11/2016 21:08 10/12/2016 07:32  Glucose-Capillary Latest Ref Range: 65 - 99 mg/dL 283 (H) 253 (H) 270 (H) 253 (H) 191 (H)   Review of Glycemic Control  Diabetes history:     DM, Obesity, BUN=31, Creatinine=1.23  Outpatient Diabetes medications:     Lantus 20 units BID, Novolog 10 units TIDAC meal coverage, Metformin 500 mg BID  Current orders for Inpatient glycemic control:     Lantus 20 units BID,     Novolog 0-9 units TIDAC and 0-5 units QHS,   Novolog 5 units meal coverage TIDAC  Inpatient Diabetes Program Recommendations:    FBG this am of 191, which is an improvement.  Patient eating > 50% of meals consistently.      Please consider increasing from current (Novolog 5 units TIDAC) meal coverage to Novolog 8 units meal coverage TIDAC if patient eats > 50% of meal.  Thank you,  Windy Carina, RN, MSN Diabetes Coordinator Inpatient Diabetes Program [phone] (Team Pager)

## 2016-10-12 NOTE — Progress Notes (Signed)
Patient alert and oriented, vss, no complaints of pain.  Removed PIV.  No questions about discharge.  Waiting on his wife to pick him up and will be escorted out of hospital via wheelchair by volunteers.

## 2016-10-12 NOTE — Progress Notes (Signed)
The Nurse asked [facility] to visit Pt. [place] visited with Pt, who was watching Tv. Pt talked with Hundred about evil in the world, his [place] experience in [place], raising horses, life in general, and health struggles, and disappointment his church minister for not visiting him while in hospital. [facility] provided a Estate manager/land agent and ministry of presence. Pt declined the [facility]'s offer for prayer.     10/12/16 1300  Clinical Encounter Type  Visited With Patient  Visit Type Initial;Spiritual support  Referral From Nurse  Consult/Referral To Chaplain  Spiritual Encounters  Spiritual Needs Prayer

## 2016-10-12 NOTE — Care Management Important Message (Signed)
Important Message  Patient Details  Name: MICKEY NELAN MRN: [id number] Date of Birth: 07/16/1941   Medicare Important Message Given:  Yes    Jolly Mango, RN 10/12/2016, 11:01 AM

## 2016-10-14 LAB — CULTURE, BLOOD (ROUTINE X 2)
CULTURE: NO GROWTH
CULTURE: NO GROWTH

## 2016-10-20 ENCOUNTER — Inpatient Hospital Stay
Admission: EM | Admit: 2016-10-20 | Discharge: 2016-10-25 | DRG: 378 | Disposition: A | Discharge status: Home / Self Care | Payer: Medicare HMO | Attending: Internal Medicine | Admitting: Internal Medicine

## 2016-10-20 ENCOUNTER — Encounter: Payer: Self-pay | Admitting: Emergency Medicine

## 2016-10-20 ENCOUNTER — Emergency Department: Payer: Medicare HMO

## 2016-10-20 DIAGNOSIS — D62 Acute posthemorrhagic anemia: Secondary | ICD-10-CM | POA: Diagnosis present

## 2016-10-20 DIAGNOSIS — Z951 Presence of aortocoronary bypass graft: Secondary | ICD-10-CM

## 2016-10-20 DIAGNOSIS — K922 Gastrointestinal hemorrhage, unspecified: Principal | ICD-10-CM | POA: Diagnosis present

## 2016-10-20 DIAGNOSIS — K5520 Angiodysplasia of colon without hemorrhage: Secondary | ICD-10-CM | POA: Diagnosis present

## 2016-10-20 DIAGNOSIS — Z794 Long term (current) use of insulin: Secondary | ICD-10-CM

## 2016-10-20 DIAGNOSIS — Z79899 Other long term (current) drug therapy: Secondary | ICD-10-CM

## 2016-10-20 DIAGNOSIS — E785 Hyperlipidemia, unspecified: Secondary | ICD-10-CM | POA: Diagnosis present

## 2016-10-20 DIAGNOSIS — I252 Old myocardial infarction: Secondary | ICD-10-CM

## 2016-10-20 DIAGNOSIS — N189 Chronic kidney disease, unspecified: Secondary | ICD-10-CM | POA: Diagnosis present

## 2016-10-20 DIAGNOSIS — K219 Gastro-esophageal reflux disease without esophagitis: Secondary | ICD-10-CM | POA: Diagnosis present

## 2016-10-20 DIAGNOSIS — D500 Iron deficiency anemia secondary to blood loss (chronic): Secondary | ICD-10-CM | POA: Diagnosis present

## 2016-10-20 DIAGNOSIS — I2510 Atherosclerotic heart disease of native coronary artery without angina pectoris: Secondary | ICD-10-CM | POA: Diagnosis present

## 2016-10-20 DIAGNOSIS — K5790 Diverticulosis of intestine, part unspecified, without perforation or abscess without bleeding: Secondary | ICD-10-CM | POA: Diagnosis present

## 2016-10-20 DIAGNOSIS — R0602 Shortness of breath: Secondary | ICD-10-CM

## 2016-10-20 DIAGNOSIS — I714 Abdominal aortic aneurysm, without rupture: Secondary | ICD-10-CM | POA: Diagnosis present

## 2016-10-20 DIAGNOSIS — Z66 Do not resuscitate: Secondary | ICD-10-CM | POA: Diagnosis present

## 2016-10-20 DIAGNOSIS — I130 Hypertensive heart and chronic kidney disease with heart failure and stage 1 through stage 4 chronic kidney disease, or unspecified chronic kidney disease: Secondary | ICD-10-CM | POA: Diagnosis present

## 2016-10-20 DIAGNOSIS — J9611 Chronic respiratory failure with hypoxia: Secondary | ICD-10-CM | POA: Diagnosis present

## 2016-10-20 DIAGNOSIS — I4510 Unspecified right bundle-branch block: Secondary | ICD-10-CM | POA: Diagnosis present

## 2016-10-20 DIAGNOSIS — I5032 Chronic diastolic (congestive) heart failure: Secondary | ICD-10-CM | POA: Diagnosis present

## 2016-10-20 DIAGNOSIS — K449 Diaphragmatic hernia without obstruction or gangrene: Secondary | ICD-10-CM | POA: Diagnosis present

## 2016-10-20 DIAGNOSIS — Z8249 Family history of ischemic heart disease and other diseases of the circulatory system: Secondary | ICD-10-CM

## 2016-10-20 DIAGNOSIS — E1140 Type 2 diabetes mellitus with diabetic neuropathy, unspecified: Secondary | ICD-10-CM | POA: Diagnosis present

## 2016-10-20 DIAGNOSIS — Z9981 Dependence on supplemental oxygen: Secondary | ICD-10-CM

## 2016-10-20 DIAGNOSIS — Z87891 Personal history of nicotine dependence: Secondary | ICD-10-CM

## 2016-10-20 DIAGNOSIS — N179 Acute kidney failure, unspecified: Secondary | ICD-10-CM | POA: Diagnosis present

## 2016-10-20 DIAGNOSIS — G8929 Other chronic pain: Secondary | ICD-10-CM | POA: Diagnosis present

## 2016-10-20 DIAGNOSIS — R531 Weakness: Secondary | ICD-10-CM | POA: Diagnosis present

## 2016-10-20 DIAGNOSIS — Z880 Allergy status to penicillin: Secondary | ICD-10-CM

## 2016-10-20 DIAGNOSIS — F411 Generalized anxiety disorder: Secondary | ICD-10-CM | POA: Diagnosis present

## 2016-10-20 DIAGNOSIS — Z888 Allergy status to other drugs, medicaments and biological substances status: Secondary | ICD-10-CM

## 2016-10-20 DIAGNOSIS — K317 Polyp of stomach and duodenum: Secondary | ICD-10-CM | POA: Diagnosis present

## 2016-10-20 DIAGNOSIS — Z885 Allergy status to narcotic agent status: Secondary | ICD-10-CM

## 2016-10-20 DIAGNOSIS — I959 Hypotension, unspecified: Secondary | ICD-10-CM | POA: Diagnosis present

## 2016-10-20 DIAGNOSIS — D649 Anemia, unspecified: Secondary | ICD-10-CM | POA: Diagnosis present

## 2016-10-20 DIAGNOSIS — E1122 Type 2 diabetes mellitus with diabetic chronic kidney disease: Secondary | ICD-10-CM | POA: Diagnosis present

## 2016-10-20 DIAGNOSIS — Z6833 Body mass index (BMI) 33.0-33.9, adult: Secondary | ICD-10-CM

## 2016-10-20 DIAGNOSIS — F3160 Bipolar disorder, current episode mixed, unspecified: Secondary | ICD-10-CM | POA: Diagnosis present

## 2016-10-20 DIAGNOSIS — E559 Vitamin D deficiency, unspecified: Secondary | ICD-10-CM | POA: Diagnosis present

## 2016-10-20 DIAGNOSIS — J449 Chronic obstructive pulmonary disease, unspecified: Secondary | ICD-10-CM | POA: Diagnosis present

## 2016-10-20 DIAGNOSIS — F329 Major depressive disorder, single episode, unspecified: Secondary | ICD-10-CM | POA: Diagnosis present

## 2016-10-20 DIAGNOSIS — G894 Chronic pain syndrome: Secondary | ICD-10-CM | POA: Diagnosis present

## 2016-10-20 HISTORY — DX: Gastro-esophageal reflux disease without esophagitis: K21.9

## 2016-10-20 LAB — CBC WITH DIFFERENTIAL/PLATELET
Basophils Absolute: 0.1 10*3/uL (ref 0–0.1)
Basophils Relative: 1 %
EOS ABS: 0.1 10*3/uL (ref 0–0.7)
Eosinophils Relative: 1 %
HEMATOCRIT: 21.3 % — AB (ref 40.0–52.0)
HEMOGLOBIN: 6.7 g/dL — AB (ref 13.0–18.0)
LYMPHS ABS: 1.3 10*3/uL (ref 1.0–3.6)
Lymphocytes Relative: 13 %
MCH: 28.9 pg (ref 26.0–34.0)
MCHC: 31.4 g/dL — ABNORMAL LOW (ref 32.0–36.0)
MCV: 91.9 fL (ref 80.0–100.0)
Monocytes Absolute: 0.7 10*3/uL (ref 0.2–1.0)
Monocytes Relative: 7 %
NEUTROS ABS: 8.2 10*3/uL — AB (ref 1.4–6.5)
NEUTROS PCT: 78 %
Platelets: 255 10*3/uL (ref 150–440)
RBC: 2.31 MIL/uL — ABNORMAL LOW (ref 4.40–5.90)
RDW: 17.1 % — ABNORMAL HIGH (ref 11.5–14.5)
WBC: 10.4 10*3/uL (ref 3.8–10.6)

## 2016-10-20 LAB — TROPONIN I: Troponin I: 0.03 ng/mL (ref <0.03)

## 2016-10-20 LAB — COMPREHENSIVE METABOLIC PANEL
ALK PHOS: 90 U/L (ref 38–126)
ALT: 12 U/L — ABNORMAL LOW (ref 17–63)
AST: 18 U/L (ref 15–41)
Albumin: 3 g/dL — ABNORMAL LOW (ref 3.5–5.0)
Anion gap: 8 (ref 5–15)
BILIRUBIN TOTAL: 0.7 mg/dL (ref 0.3–1.2)
BUN: 39 mg/dL — ABNORMAL HIGH (ref 6–20)
CALCIUM: 8.1 mg/dL — AB (ref 8.9–10.3)
CO2: 33 mmol/L — AB (ref 22–32)
CREATININE: 1.49 mg/dL — AB (ref 0.61–1.24)
Chloride: 98 mmol/L — ABNORMAL LOW (ref 101–111)
GFR calc Af Amer: 52 mL/min — ABNORMAL LOW (ref >60)
GFR calc non Af Amer: 44 mL/min — ABNORMAL LOW (ref >60)
Glucose, Bld: 249 mg/dL — ABNORMAL HIGH (ref 65–99)
Potassium: 4.5 mmol/L (ref 3.5–5.1)
SODIUM: 139 mmol/L (ref 135–145)
Total Protein: 5.8 g/dL — ABNORMAL LOW (ref 6.5–8.1)

## 2016-10-20 LAB — PREPARE RBC (CROSSMATCH)

## 2016-10-20 LAB — BRAIN NATRIURETIC PEPTIDE: B Natriuretic Peptide: 131 pg/mL — ABNORMAL HIGH (ref 0.0–100.0)

## 2016-10-20 MED ORDER — PANTOPRAZOLE SODIUM 40 MG IV SOLR
40.0000 mg | Freq: Two times a day (BID) | INTRAVENOUS | Status: DC
  Administered 2016-10-24: 10:00:00 40 mg via INTRAVENOUS
  Filled 2016-10-20: qty 40

## 2016-10-20 MED ORDER — SODIUM CHLORIDE 0.9 % IV SOLN
8.0000 mg/h | INTRAVENOUS | Status: AC
  Administered 2016-10-20 – 2016-10-23 (×7): 8 mg/h via INTRAVENOUS
  Filled 2016-10-20 (×8): qty 80

## 2016-10-20 MED ORDER — LORAZEPAM 2 MG/ML IJ SOLN
1.0000 mg | Freq: Once | INTRAMUSCULAR | Status: AC
  Administered 2016-10-20: 1 mg via INTRAVENOUS
  Filled 2016-10-20: qty 1

## 2016-10-20 MED ORDER — SODIUM CHLORIDE 0.9 % IV SOLN
Freq: Once | INTRAVENOUS | Status: AC
  Administered 2016-10-20: 23:00:00 via INTRAVENOUS

## 2016-10-20 MED ORDER — IPRATROPIUM-ALBUTEROL 0.5-2.5 (3) MG/3ML IN SOLN
3.0000 mL | Freq: Once | RESPIRATORY_TRACT | Status: AC
  Administered 2016-10-20: 3 mL via RESPIRATORY_TRACT
  Filled 2016-10-20: qty 3

## 2016-10-20 MED ORDER — SODIUM CHLORIDE 0.9 % IV SOLN
80.0000 mg | Freq: Once | INTRAVENOUS | Status: AC
  Administered 2016-10-20: 23:00:00 80 mg via INTRAVENOUS
  Filled 2016-10-20: qty 80

## 2016-10-20 NOTE — ED Notes (Signed)
Attempted 2 PIV's to patient's left arm, attempts unsuccessful.

## 2016-10-20 NOTE — ED Triage Notes (Signed)
Arrives via [facility] with C/O SOB.  Patient states he was hospitalized over christmas and just "hasn't felt well since discharge".  Patient wears 6L home oxygen.  Albuterol given by EMS.
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
## 2016-10-20 NOTE — ED Notes (Signed)
Patient assisted to use toilet. Upon sitting up to edge of bed, patient became very anxious and reports "I can't catch my breath". Patient requested non-rebreather to be applied while up to commode. Patient able to turn and sit on toilet with one person assist. Patient O2 remained above 90% while up. Patient returned to bed without difficulty. Patient placed back on 6L [place] per request. O2 is currently 98%.

## 2016-10-21 DIAGNOSIS — D500 Iron deficiency anemia secondary to blood loss (chronic): Secondary | ICD-10-CM | POA: Diagnosis present

## 2016-10-21 DIAGNOSIS — J449 Chronic obstructive pulmonary disease, unspecified: Secondary | ICD-10-CM | POA: Diagnosis present

## 2016-10-21 DIAGNOSIS — N179 Acute kidney failure, unspecified: Secondary | ICD-10-CM | POA: Diagnosis present

## 2016-10-21 DIAGNOSIS — N189 Chronic kidney disease, unspecified: Secondary | ICD-10-CM | POA: Diagnosis present

## 2016-10-21 DIAGNOSIS — K219 Gastro-esophageal reflux disease without esophagitis: Secondary | ICD-10-CM | POA: Diagnosis present

## 2016-10-21 DIAGNOSIS — F3160 Bipolar disorder, current episode mixed, unspecified: Secondary | ICD-10-CM | POA: Diagnosis present

## 2016-10-21 DIAGNOSIS — D62 Acute posthemorrhagic anemia: Secondary | ICD-10-CM | POA: Diagnosis not present

## 2016-10-21 DIAGNOSIS — K5520 Angiodysplasia of colon without hemorrhage: Secondary | ICD-10-CM | POA: Diagnosis present

## 2016-10-21 DIAGNOSIS — K449 Diaphragmatic hernia without obstruction or gangrene: Secondary | ICD-10-CM | POA: Diagnosis not present

## 2016-10-21 DIAGNOSIS — J9611 Chronic respiratory failure with hypoxia: Secondary | ICD-10-CM | POA: Diagnosis present

## 2016-10-21 DIAGNOSIS — E559 Vitamin D deficiency, unspecified: Secondary | ICD-10-CM | POA: Diagnosis present

## 2016-10-21 DIAGNOSIS — I2510 Atherosclerotic heart disease of native coronary artery without angina pectoris: Secondary | ICD-10-CM | POA: Diagnosis present

## 2016-10-21 DIAGNOSIS — E785 Hyperlipidemia, unspecified: Secondary | ICD-10-CM | POA: Diagnosis present

## 2016-10-21 DIAGNOSIS — K317 Polyp of stomach and duodenum: Secondary | ICD-10-CM | POA: Diagnosis not present

## 2016-10-21 DIAGNOSIS — I959 Hypotension, unspecified: Secondary | ICD-10-CM | POA: Diagnosis present

## 2016-10-21 DIAGNOSIS — I5032 Chronic diastolic (congestive) heart failure: Secondary | ICD-10-CM | POA: Diagnosis present

## 2016-10-21 DIAGNOSIS — D649 Anemia, unspecified: Secondary | ICD-10-CM | POA: Diagnosis present

## 2016-10-21 DIAGNOSIS — I130 Hypertensive heart and chronic kidney disease with heart failure and stage 1 through stage 4 chronic kidney disease, or unspecified chronic kidney disease: Secondary | ICD-10-CM | POA: Diagnosis present

## 2016-10-21 DIAGNOSIS — I4510 Unspecified right bundle-branch block: Secondary | ICD-10-CM | POA: Diagnosis present

## 2016-10-21 DIAGNOSIS — R531 Weakness: Secondary | ICD-10-CM | POA: Diagnosis present

## 2016-10-21 DIAGNOSIS — E1140 Type 2 diabetes mellitus with diabetic neuropathy, unspecified: Secondary | ICD-10-CM | POA: Diagnosis present

## 2016-10-21 DIAGNOSIS — G8929 Other chronic pain: Secondary | ICD-10-CM | POA: Diagnosis present

## 2016-10-21 DIAGNOSIS — K922 Gastrointestinal hemorrhage, unspecified: Secondary | ICD-10-CM | POA: Diagnosis present

## 2016-10-21 DIAGNOSIS — Z66 Do not resuscitate: Secondary | ICD-10-CM | POA: Diagnosis present

## 2016-10-21 DIAGNOSIS — I714 Abdominal aortic aneurysm, without rupture: Secondary | ICD-10-CM | POA: Diagnosis present

## 2016-10-21 DIAGNOSIS — E1122 Type 2 diabetes mellitus with diabetic chronic kidney disease: Secondary | ICD-10-CM | POA: Diagnosis present

## 2016-10-21 LAB — IRON AND TIBC
Iron: 177 ug/dL (ref 45–182)
SATURATION RATIOS: 46 % — AB (ref 17.9–39.5)
TIBC: 388 ug/dL (ref 250–450)
UIBC: 211 ug/dL

## 2016-10-21 LAB — GLUCOSE, CAPILLARY
GLUCOSE-CAPILLARY: 237 mg/dL — AB (ref 65–99)
Glucose-Capillary: 144 mg/dL — ABNORMAL HIGH (ref 65–99)
Glucose-Capillary: 167 mg/dL — ABNORMAL HIGH (ref 65–99)
Glucose-Capillary: 202 mg/dL — ABNORMAL HIGH (ref 65–99)
Glucose-Capillary: 227 mg/dL — ABNORMAL HIGH (ref 65–99)
Glucose-Capillary: 298 mg/dL — ABNORMAL HIGH (ref 65–99)

## 2016-10-21 LAB — FERRITIN: FERRITIN: 43 ng/mL (ref 24–336)

## 2016-10-21 LAB — PREPARE RBC (CROSSMATCH)

## 2016-10-21 LAB — HEMOGLOBIN AND HEMATOCRIT, BLOOD
HCT: 24.5 % — ABNORMAL LOW (ref 40.0–52.0)
Hemoglobin: 8.1 g/dL — ABNORMAL LOW (ref 13.0–18.0)

## 2016-10-21 MED ORDER — ORAL CARE MOUTH RINSE
15.0000 mL | Freq: Two times a day (BID) | OROMUCOSAL | Status: DC
  Administered 2016-10-21 – 2016-10-25 (×8): 15 mL via OROMUCOSAL

## 2016-10-21 MED ORDER — CARBAMIDE PEROXIDE 6.5 % OT SOLN: 5.0000 [drp] | Freq: Two times a day (BID) | OTIC | Status: DC | PRN

## 2016-10-21 MED ORDER — LISINOPRIL 5 MG PO TABS
2.5000 mg | ORAL_TABLET | Freq: Every day | ORAL | Status: DC
  Administered 2016-10-22 – 2016-10-25 (×3): 2.5 mg via ORAL
  Filled 2016-10-21 (×3): qty 1

## 2016-10-21 MED ORDER — MOMETASONE FURO-FORMOTEROL FUM 200-5 MCG/ACT IN AERO
2.0000 | INHALATION_SPRAY | Freq: Two times a day (BID) | RESPIRATORY_TRACT | Status: DC
  Administered 2016-10-21 – 2016-10-25 (×9): 2 via RESPIRATORY_TRACT
  Filled 2016-10-21: qty 8.8

## 2016-10-21 MED ORDER — SODIUM CHLORIDE 0.9 % IV SOLN
INTRAVENOUS | Status: DC
  Administered 2016-10-21 – 2016-10-24 (×4): via INTRAVENOUS

## 2016-10-21 MED ORDER — VITAMIN D 1000 UNITS PO TABS
1000.0000 [IU] | ORAL_TABLET | Freq: Every day | ORAL | Status: DC
  Administered 2016-10-21 – 2016-10-25 (×4): 1000 [IU] via ORAL
  Filled 2016-10-21 (×5): qty 1

## 2016-10-21 MED ORDER — TRAMADOL-ACETAMINOPHEN 37.5-325 MG PO TABS
1.0000 | ORAL_TABLET | Freq: Two times a day (BID) | ORAL | Status: DC | PRN
  Administered 2016-10-21: 2 via ORAL
  Administered 2016-10-22: 1 via ORAL
  Administered 2016-10-22 (×2): 2 via ORAL
  Administered 2016-10-23: 18:00:00 1 via ORAL
  Administered 2016-10-24: 2 via ORAL
  Filled 2016-10-21 (×2): qty 2
  Filled 2016-10-21 (×2): qty 1
  Filled 2016-10-21 (×2): qty 2

## 2016-10-21 MED ORDER — ATORVASTATIN CALCIUM 20 MG PO TABS
40.0000 mg | ORAL_TABLET | Freq: Every day | ORAL | Status: DC
  Administered 2016-10-21 – 2016-10-24 (×4): 40 mg via ORAL
  Filled 2016-10-21 (×4): qty 2

## 2016-10-21 MED ORDER — SODIUM CHLORIDE 0.9% FLUSH
3.0000 mL | Freq: Two times a day (BID) | INTRAVENOUS | Status: DC
  Administered 2016-10-21 – 2016-10-25 (×7): 3 mL via INTRAVENOUS

## 2016-10-21 MED ORDER — TIOTROPIUM BROMIDE MONOHYDRATE 18 MCG IN CAPS
18.0000 ug | ORAL_CAPSULE | Freq: Every day | RESPIRATORY_TRACT | Status: DC
  Administered 2016-10-21 – 2016-10-25 (×5): 18 ug via RESPIRATORY_TRACT
  Filled 2016-10-21: qty 5

## 2016-10-21 MED ORDER — ZOLPIDEM TARTRATE 5 MG PO TABS
5.0000 mg | ORAL_TABLET | Freq: Every evening | ORAL | Status: DC | PRN
  Administered 2016-10-21 – 2016-10-24 (×4): 5 mg via ORAL
  Filled 2016-10-21 (×4): qty 1

## 2016-10-21 MED ORDER — ALPRAZOLAM 0.5 MG PO TABS
0.5000 mg | ORAL_TABLET | Freq: Three times a day (TID) | ORAL | Status: DC | PRN
  Administered 2016-10-22 – 2016-10-24 (×4): 0.5 mg via ORAL
  Filled 2016-10-21 (×5): qty 1

## 2016-10-21 MED ORDER — SUCRALFATE 1 G PO TABS
1.0000 g | ORAL_TABLET | Freq: Three times a day (TID) | ORAL | Status: DC
  Administered 2016-10-21 – 2016-10-25 (×15): 1 g via ORAL
  Filled 2016-10-21 (×16): qty 1

## 2016-10-21 MED ORDER — ONDANSETRON HCL 4 MG/2ML IJ SOLN: 4.0000 mg | Freq: Four times a day (QID) | INTRAMUSCULAR | Status: DC | PRN

## 2016-10-21 MED ORDER — INSULIN ASPART 100 UNIT/ML ~~LOC~~ SOLN
0.0000 [IU] | SUBCUTANEOUS | Status: DC
  Administered 2016-10-21: 4 [IU] via SUBCUTANEOUS
  Administered 2016-10-21: 3 [IU] via SUBCUTANEOUS
  Administered 2016-10-21 – 2016-10-22 (×4): 7 [IU] via SUBCUTANEOUS
  Administered 2016-10-22: 4 [IU] via SUBCUTANEOUS
  Administered 2016-10-22: 7 [IU] via SUBCUTANEOUS
  Administered 2016-10-22: 21:00:00 4 [IU] via SUBCUTANEOUS
  Administered 2016-10-22: 7 [IU] via SUBCUTANEOUS
  Administered 2016-10-22: 15 [IU] via SUBCUTANEOUS
  Administered 2016-10-23: 21:00:00 7 [IU] via SUBCUTANEOUS
  Administered 2016-10-23 (×2): 11 [IU] via SUBCUTANEOUS
  Administered 2016-10-23: 17:00:00 4 [IU] via SUBCUTANEOUS
  Administered 2016-10-23: 13:00:00 3 [IU] via SUBCUTANEOUS
  Administered 2016-10-23: 7 [IU] via SUBCUTANEOUS
  Administered 2016-10-24: 17:00:00 4 [IU] via SUBCUTANEOUS
  Administered 2016-10-24: 3 [IU] via SUBCUTANEOUS
  Administered 2016-10-24: 4 [IU] via SUBCUTANEOUS
  Administered 2016-10-24: 3 [IU] via SUBCUTANEOUS
  Administered 2016-10-24: 7 [IU] via SUBCUTANEOUS
  Administered 2016-10-24: 08:00:00 3 [IU] via SUBCUTANEOUS
  Administered 2016-10-25: 7 [IU] via SUBCUTANEOUS
  Administered 2016-10-25: 4 [IU] via SUBCUTANEOUS
  Administered 2016-10-25: 7 [IU] via SUBCUTANEOUS
  Filled 2016-10-21 (×2): qty 3
  Filled 2016-10-21 (×2): qty 7
  Filled 2016-10-21: qty 4
  Filled 2016-10-21: qty 11
  Filled 2016-10-21: qty 4
  Filled 2016-10-21: qty 7
  Filled 2016-10-21 (×2): qty 4
  Filled 2016-10-21: qty 11
  Filled 2016-10-21: qty 7
  Filled 2016-10-21: qty 11
  Filled 2016-10-21: qty 4
  Filled 2016-10-21: qty 7
  Filled 2016-10-21: qty 3
  Filled 2016-10-21 (×2): qty 7
  Filled 2016-10-21: qty 3
  Filled 2016-10-21: qty 7
  Filled 2016-10-21 (×2): qty 4
  Filled 2016-10-21: qty 7
  Filled 2016-10-21: qty 3
  Filled 2016-10-21 (×2): qty 7

## 2016-10-21 MED ORDER — CITALOPRAM HYDROBROMIDE 20 MG PO TABS
40.0000 mg | ORAL_TABLET | Freq: Every day | ORAL | Status: DC
  Administered 2016-10-21 – 2016-10-25 (×5): 40 mg via ORAL
  Filled 2016-10-21 (×6): qty 2

## 2016-10-21 MED ORDER — INSULIN GLARGINE 100 UNIT/ML ~~LOC~~ SOLN
20.0000 [IU] | Freq: Two times a day (BID) | SUBCUTANEOUS | Status: DC
  Administered 2016-10-21 – 2016-10-25 (×9): 20 [IU] via SUBCUTANEOUS
  Filled 2016-10-21 (×10): qty 0.2

## 2016-10-21 MED ORDER — METOPROLOL TARTRATE 25 MG PO TABS
25.0000 mg | ORAL_TABLET | Freq: Two times a day (BID) | ORAL | Status: DC
  Administered 2016-10-21 – 2016-10-25 (×7): 25 mg via ORAL
  Filled 2016-10-21 (×7): qty 1

## 2016-10-21 MED ORDER — ALBUTEROL SULFATE (2.5 MG/3ML) 0.083% IN NEBU: 2.5000 mg | INHALATION_SOLUTION | Freq: Four times a day (QID) | RESPIRATORY_TRACT | Status: DC | PRN

## 2016-10-21 MED ORDER — TORSEMIDE 20 MG PO TABS
20.0000 mg | ORAL_TABLET | Freq: Two times a day (BID) | ORAL | Status: DC
  Administered 2016-10-21 – 2016-10-25 (×7): 20 mg via ORAL
  Filled 2016-10-21 (×8): qty 1

## 2016-10-21 MED ORDER — FERROUS SULFATE 325 (65 FE) MG PO TABS
325.0000 mg | ORAL_TABLET | Freq: Three times a day (TID) | ORAL | Status: DC
  Administered 2016-10-21 – 2016-10-25 (×11): 325 mg via ORAL
  Filled 2016-10-21 (×12): qty 1

## 2016-10-21 MED ORDER — GABAPENTIN 300 MG PO CAPS
300.0000 mg | ORAL_CAPSULE | Freq: Three times a day (TID) | ORAL | Status: DC
  Administered 2016-10-21 – 2016-10-25 (×11): 300 mg via ORAL
  Filled 2016-10-21 (×12): qty 1

## 2016-10-21 MED ORDER — ACETAMINOPHEN 325 MG PO TABS
650.0000 mg | ORAL_TABLET | Freq: Four times a day (QID) | ORAL | Status: DC | PRN
  Administered 2016-10-22 – 2016-10-23 (×2): 650 mg via ORAL
  Filled 2016-10-21 (×2): qty 2

## 2016-10-21 MED ORDER — ONDANSETRON HCL 4 MG PO TABS: 4.0000 mg | ORAL_TABLET | Freq: Four times a day (QID) | ORAL | Status: DC | PRN
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
## 2016-10-21 NOTE — Progress Notes (Signed)
Nutrition Brief Note  Patient identified on the Malnutrition Screening Tool (MST) Report  Wt Readings from Last 15 Encounters:  10/21/16 258 lb 2.5 oz (117.1 kg)  10/09/16 270 lb (122.5 kg)  08/02/16 267 lb (121.1 kg)  07/26/16 283 lb (128.4 kg)  02/10/16 259 lb (117.5 kg)  10/25/15 265 lb (120.2 kg)  09/17/15 268 lb (121.6 kg)  08/20/15 289 lb 3.2 oz (131.2 kg)  07/27/15 272 lb (123.4 kg)  07/03/15 271 lb 1.6 oz (123 kg)  05/28/15 264 lb (119.7 kg)  05/23/15 273 lb 1.6 oz (123.9 kg)  05/11/15 262 lb (118.8 kg)  05/04/15 266 lb 1.6 oz (120.7 kg)  04/27/15 260 lb 3.2 oz (118 kg)    Body mass index is 33.15 kg/m. Patient meets criteria for obesity unspecified based on current BMI. Weight loss trend not significant  Current diet order is Heart Healthy, good appetite, tolerating meals. Labs and medications reviewed.   No nutrition interventions warranted at this time. If nutrition issues arise, please consult RD.   Kerman Passey [place], [place], LDN [phone] Pager  [phone] Weekend/On-Call Pager

## 2016-10-21 NOTE — Clinical Social Work Note (Signed)
CSW received consult for self neglect. Patient is well known to this CSW and has had frequent hospital admissions. Patient resides with his wife. Patient has home health, through [facility], coming in to the home. Please re-consult CSW if PT recommends a higher level of care for patient or if further needs arise. Shela Leff MSW,LCSW [phone]

## 2016-10-21 NOTE — Care Management (Addendum)
Patient recently discharged home with [facility] home health assigned. I have left message for Santiago Glad with [facility] to check status. COPD Gold patient. RNCM will follow up with patient. Patient is open to [facility]- they are making sure what services. Message left for patient's wife.
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
## 2016-10-21 NOTE — Progress Notes (Signed)
PT Cancellation Note  Patient Details Name: [name] MRN: [id number] DOB: 1941/07/21   Cancelled Treatment:    Reason Eval/Treat Not Completed: Patient declined, no reason specified; Pt refused PT eval secondary to fatigue and low HgB, "I know my body and I can't do anything right now".  Will attempt PT eval at another date as appropriate.   Linus Salmons PT, DPT 10/21/16, 12:45 PM

## 2016-10-21 NOTE — Discharge Summary (Signed)
[facility] NAME: [name]    MR#:  [id number]  DATE OF BIRTH:  [birth year]  DATE OF ADMISSION:  10/09/2016  ADMITTING PHYSICIAN: Lytle Butte, MD  DATE OF DISCHARGE: 10/11/16  PRIMARY CARE PHYSICIAN: [name], MD    ADMISSION DIAGNOSIS:  Shortness of breath [R06.02] Gastrointestinal hemorrhage associated with chronic gastritis [K29.51]  DISCHARGE DIAGNOSIS:  Symptomatic anemia with history of chronic GI bleed secondary to chronic AVM status post 2 unit blood transfusion -Chronic respiratory failure on chronic home oxygen  SECONDARY DIAGNOSIS:       Past Medical History:  Diagnosis Date  . AAA (abdominal aortic aneurysm) ([place])    a. 12/2008 s/p 7cm, endovascular repair with coiling right hypogastric artery   . Allergic rhinitis, cause unspecified   . Anxiety state 09/10/2013  . AVM (arteriovenous malformation) of colon with hemorrhage   . Bipolar 1 disorder, mixed, moderate ([place]) 04/16/2015  . CAD (coronary artery disease)    a. 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  . Chronic diastolic CHF (congestive heart failure) ([place])    a. 04/2015 Echo: EF 55-60%, no rwma, Gr 1 DD, mild AI.  Marland Kitchen COPD (chronic obstructive pulmonary disease) ([place])    a. GOLD stage IV, started home O2. Severe bullous disease of LUL. Prolonged intubation after surgeries due to COPD.  Marland Kitchen Depression 01/14/2013  . Diverticulosis   . Essential hypertension   . Essential hypertension 08/18/2009   Qualifier: Diagnosis of  By: Doy Mince LPN, Megan    . GI bleed requiring more than 4 units of blood in 24 hours, ICU, or surgery    a. Hx bleeding gastric polyps, cecal & sigmoid AVMS s/p APC 03/30/14  . Hyperlipidemia   . Insomnia 08/10/2014  . Leucocytosis 12/04/2013  . Memory loss   . Morbid obesity ([place])   . Multiple gastric polyps   . Recurrent Microcytic Anemia    a.  presumed chronic GI blood loss.  . Type II diabetes mellitus ([place])   . Vitamin D deficiency 08/10/2014    HOSPITAL COURSE:  76 year old Caucasian gentleman history of COPD 5 L oxygen baseline, chronic GI bleed presenting with dyspnea on exertion, weakness  1. Increasing sob and weakness due to acute on chronic anemia -no s/o infection or pneumonia -d/c IV abxs  2. Chronic GI bleed: Hemoglobin is 7.1  -will check iron levels given low MCV, high RDW patient will likely require iron supplementation again -transfuse 1 unit today Was 8.9---7.1--1 unit Bt--7.5--2nd unit today--9.3 -cont po iron pills -pt will benefit from Hematology f/u -will get appt with dr Grayland Ormond. Pt informed -will give one dose of IV iron 10/11/16 -no BRBPR reported  3. Hyperlipidemia unspecified on statin therapy  4. Type 2 diabetes insulin requiring: Hold oral agents continue sliding so, for treatment of basal insulin  5. Essential tension: Lisinopril, Lopressor -BP soft today---decreased bb to 12.5 bid  PT recommends HHPT Cm for d/c planning Will d/c to home today. Pt agreeable CONSULTS OBTAINED:    DRUG ALLERGIES:        Allergies  Allergen Reactions  . Morphine And Related Shortness Of Breath, Nausea And Vomiting, Rash and Other (See Comments)    Reaction:  Hallucinations   . Penicillins Anaphylaxis, Hives and Other (See Comments)    Has patient had a PCN reaction causing immediate rash, facial/tongue/throat swelling, SOB or lightheadedness with hypotension: Yes Has patient had a PCN reaction  causing severe rash involving mucus membranes or skin necrosis: No Has patient had a PCN reaction that required hospitalization No Has patient had a PCN reaction occurring within the last 10 years: No If all of the above answers are "NO", then may proceed with Cephalosporin use.  . Demerol [Meperidine] Other (See Comments)    Reaction:  Hallucinations    . Dilaudid [Hydromorphone Hcl] Other  (See Comments)    Reaction:  Hallucinations   . Levofloxacin Other (See Comments)    Reaction:  Unknown     DISCHARGE MEDICATIONS:      Current Discharge Medication List       CONTINUE these medications which have NOT CHANGED   Details  acetaminophen (TYLENOL) 325 MG tablet Take 650 mg by mouth every 6 (six) hours as needed for mild pain, fever or headache.     albuterol (PROVENTIL HFA;VENTOLIN HFA) 108 (90 BASE) MCG/ACT inhaler Inhale 2 puffs into the lungs every 6 (six) hours as needed for wheezing or shortness of breath. Qty: 1 Inhaler, Refills: 2    ALPRAZolam (XANAX) 0.25 MG tablet Take 0.5 mg by mouth 3 (three) times daily as needed for anxiety or sleep.    atorvastatin (LIPITOR) 40 MG tablet Take 40 mg by mouth at bedtime.    budesonide-formoterol (SYMBICORT) 160-4.5 MCG/ACT inhaler Inhale 2 puffs into the lungs 2 (two) times daily. Qty: 1 Inhaler, Refills: 5    carbamide peroxide (DEBROX) 6.5 % otic solution Place 5 drops into both ears 2 (two) times daily. Qty: 15 mL, Refills: 0    cholecalciferol (VITAMIN D) 1000 UNITS tablet Take 1,000 Units by mouth daily.    citalopram (CELEXA) 40 MG tablet Take 40 mg by mouth daily.    ferrous sulfate 325 (65 FE) MG tablet Take 325 mg by mouth 3 (three) times daily with meals.     gabapentin (NEURONTIN) 300 MG capsule Take 300 mg by mouth 3 (three) times daily.    insulin aspart (NOVOLOG) 100 UNIT/ML injection Inject 10 Units into the skin 3 (three) times daily before meals.    insulin glargine (LANTUS) 100 UNIT/ML injection Inject 0.2 mLs (20 Units total) into the skin 2 (two) times daily. Qty: 10 mL, Refills: 1    lidocaine (XYLOCAINE) 5 % ointment Apply 1 application topically as needed for mild pain.    lisinopril (PRINIVIL,ZESTRIL) 2.5 MG tablet Take 2.5 mg by mouth daily.    metFORMIN (GLUCOPHAGE) 500 MG tablet Take 500 mg by mouth 2 (two) times daily with a meal.    metoprolol tartrate  (LOPRESSOR) 25 MG tablet Take 25 mg by mouth 2 (two) times daily.    pantoprazole (PROTONIX) 40 MG tablet Take 40 mg by mouth daily.    sucralfate (CARAFATE) 1 G tablet Take 1 g by mouth 4 (four) times daily -  with meals and at bedtime.    tiotropium (SPIRIVA) 18 MCG inhalation capsule Place 18 mcg into inhaler and inhale daily.    torsemide (DEMADEX) 20 MG tablet Take 1 tablet (20 mg total) by mouth 2 (two) times daily. Qty: 60 tablet, Refills: 1    traMADol-acetaminophen (ULTRACET) 37.5-325 MG tablet Take 1 tablet by mouth 2 (two) times daily as needed for moderate pain.    zolpidem (AMBIEN) 5 MG tablet Take 1 tablet (5 mg total) by mouth at bedtime as needed for sleep. Qty: 30 tablet, Refills: 0    Umeclidinium Bromide (INCRUSE ELLIPTA) 62.5 MCG/INH AEPB Inhale 1 puff into the lungs daily. Qty: 1  each, Refills: 0

## 2016-10-21 NOTE — Progress Notes (Signed)
[place] at [facility]                                                                                                                                                                                  Patient Demographics   [name], is a 76 y.o. male, DOB - 01-Sep-1941, [id number]  Admit date - 10/20/2016   Admitting Physician Harvie Bridge, DO  Outpatient Primary MD for the patient is ROBERTS, Sharol Given, MD   LOS - 0  Subjective: Patient with multiple admissions for similar type of present agitation presents back with dark tarry stools as well as worsening shortness of breath patient keeps insisting that his iron level may be low. Also states that he needs to get transfused up to hemoglobin of 10.,     Review of Systems:   CONSTITUTIONAL: No documented fever. No fatigue, weakness. No weight gain, no weight loss.  EYES: No blurry or double vision.  ENT: No tinnitus. No postnasal drip. No redness of the oropharynx.  RESPIRATORY: No cough, no wheeze, no hemoptysis. Chronic shortness of breath  CARDIOVASCULAR: No chest pain. No orthopnea. No palpitations. No syncope.  GASTROINTESTINAL: No nausea, no vomiting or diarrhea. No abdominal pain. No melena or hematochezia.  GENITOURINARY: No dysuria or hematuria.  ENDOCRINE: No polyuria or nocturia. No heat or cold intolerance.  HEMATOLOGY: No anemia. No bruising. No bleeding.  INTEGUMENTARY: No rashes. No lesions.  MUSCULOSKELETAL: No arthritis. No swelling. No gout.  NEUROLOGIC: No numbness, tingling, or ataxia. No seizure-type activity.  PSYCHIATRIC: No anxiety. No insomnia. No ADD.    Vitals:   Vitals:   10/21/16 1110 10/21/16 1200 10/21/16 1300 10/21/16 1400  BP: 126/79 130/74 104/66 (!) 112/59  Pulse: 94 93 90 (!) 103  Resp: 15 18 18 16   Temp:  98.1 F (36.7 C)    TempSrc:  Oral    SpO2: 100% 100% 99% 100%  Weight:      Height:        Wt Readings from Last 3 Encounters:  10/21/16  258 lb 2.5 oz (117.1 kg)  10/09/16 270 lb (122.5 kg)  08/02/16 267 lb (121.1 kg)     Intake/Output Summary (Last 24 hours) at 10/21/16 1527 Last data filed at 10/21/16 1300  Gross per 24 hour  Intake          1165.42 ml  Output             1125 ml  Net            40.42 ml    Physical Exam:   GENERAL: Pleasant-appearing in no apparent distress.  HEAD, EYES, EARS, NOSE AND THROAT:  Atraumatic, normocephalic. Extraocular muscles are intact. Pupils equal and reactive to light. Sclerae anicteric. No conjunctival injection. No oro-pharyngeal erythema.  NECK: Supple. There is no jugular venous distention. No bruits, no lymphadenopathy, no thyromegaly.  HEART: Regular rate and rhythm,. No murmurs, no rubs, no clicks.  LUNGSDecreased breath sounds bilaterally . No rales or rhonchi. No wheezes.  ABDOMEN: Soft, flat, nontender, nondistended. Has good bowel sounds. No hepatosplenomegaly appreciated.  EXTREMITIES: No evidence of any cyanosis, clubbing, or peripheral edema.  +2 pedal and radial pulses bilaterally.  NEUROLOGIC: The patient is alert, awake, and oriented x3 with no focal motor or sensory deficits appreciated bilaterally.  SKIN: Moist and warm with no rashes appreciated.  Psych: Not anxious, depressed LN: No inguinal LN enlargement    Antibiotics   Anti-infectives    None      Medications   Scheduled Meds: . atorvastatin  40 mg Oral QHS  . cholecalciferol  1,000 Units Oral Daily  . citalopram  40 mg Oral Daily  . ferrous sulfate  325 mg Oral TID WC  . gabapentin  300 mg Oral TID  . insulin aspart  0-20 Units Subcutaneous Q4H  . insulin glargine  20 Units Subcutaneous BID  . lisinopril  2.5 mg Oral Daily  . mouth rinse  15 mL Mouth Rinse BID  . metoprolol tartrate  25 mg Oral BID  . mometasone-formoterol  2 puff Inhalation BID  . [START ON 10/24/2016] pantoprazole  40 mg Intravenous Q12H  . sodium chloride flush  3 mL Intravenous Q12H  . sucralfate  1 g Oral TID WC & HS   . tiotropium  18 mcg Inhalation Daily  . torsemide  20 mg Oral BID   Continuous Infusions: . sodium chloride 30 mL/hr at 10/21/16 0447  . pantoprozole (PROTONIX) infusion 8 mg/hr (10/21/16 0954)   PRN Meds:.acetaminophen, albuterol, ALPRAZolam, carbamide peroxide, ondansetron **OR** ondansetron (ZOFRAN) IV, traMADol-acetaminophen   Data Review:   Micro Results No results found for this or any previous visit (from the past 240 hour(s)).  Radiology Reports Dg Chest Portable 1 View  Result Date: 10/20/2016 CLINICAL DATA:  Shortness of breath, chest pain. EXAM: PORTABLE CHEST 1 VIEW COMPARISON:  Radiographs of October 09, 2016. FINDINGS: Stable cardiomegaly. Status post coronary artery bypass graft. No pneumothorax is noted. Right lung is clear. Stable left basilar opacity is noted concerning for atelectasis or infiltrate with possible associated pleural effusion. Bony thorax is unremarkable. IMPRESSION: Stable left basilar opacity as described above. Electronically Signed   By: Marijo Conception, M.D.   On: 10/20/2016 20:00   Dg Chest Portable 1 View  Result Date: 10/09/2016 CLINICAL DATA:  Weakness, shortness of breath, and GI bleeding for 2 weeks, history COPD, abdominal aortic aneurysm, coronary artery disease, hypertension, type II diabetes mellitus, former smoker EXAM: PORTABLE CHEST 1 VIEW COMPARISON:  Portable exam 0637 hours compared to 07/24/2016 FINDINGS: Upper normal heart size post CABG. Atherosclerotic calcification aorta. Hiatal hernia, noted on prior CT from 2016. Mediastinal contours and pulmonary vascularity otherwise normal. Stable pleural thickening at LEFT apex. Underlying emphysematous changes with chronic atelectasis versus consolidation in LEFT lower lobe. Linear scarring LEFT upper lobe. No definite pleural effusion or pneumothorax. Bones demineralized. IMPRESSION: Changes of COPD with LEFT upper lobe scarring and chronic atelectasis versus consolidation in LEFT lower  lobe. Hiatal hernia. Post CABG. Electronically Signed   By: Lavonia Dana M.D.   On: 10/09/2016 07:25     CBC  Recent Labs Lab 10/20/16 2142 10/21/16 0744  WBC 10.4  --   HGB 6.7* 8.1*  HCT 21.3* 24.5*  PLT 255  --   MCV 91.9  --   MCH 28.9  --   MCHC 31.4*  --   RDW 17.1*  --   LYMPHSABS 1.3  --   MONOABS 0.7  --   EOSABS 0.1  --   BASOSABS 0.1  --     Chemistries   Recent Labs Lab 10/20/16 2142  NA 139  K 4.5  CL 98*  CO2 33*  GLUCOSE 249*  BUN 39*  CREATININE 1.49*  CALCIUM 8.1*  AST 18  ALT 12*  ALKPHOS 90  BILITOT 0.7   ------------------------------------------------------------------------------------------------------------------ estimated creatinine clearance is 59.2 mL/min (by C-G formula based on SCr of 1.49 mg/dL (H)). ------------------------------------------------------------------------------------------------------------------ No results for input(s): HGBA1C in the last 72 hours. ------------------------------------------------------------------------------------------------------------------ No results for input(s): CHOL, HDL, LDLCALC, TRIG, CHOLHDL, LDLDIRECT in the last 72 hours. ------------------------------------------------------------------------------------------------------------------ No results for input(s): TSH, T4TOTAL, T3FREE, THYROIDAB in the last 72 hours.  Invalid input(s): FREET3 ------------------------------------------------------------------------------------------------------------------ No results for input(s): VITAMINB12, FOLATE, FERRITIN, TIBC, IRON, RETICCTPCT in the last 72 hours.  Coagulation profile No results for input(s): INR, PROTIME in the last 168 hours.  No results for input(s): DDIMER in the last 72 hours.  Cardiac Enzymes  Recent Labs Lab 10/20/16 2142  TROPONINI 0.03*   ------------------------------------------------------------------------------------------------------------------ Invalid  input(s): POCBNP    Assessment & Plan   This is a 76 y.o. male with a history of CAD, end stage COPD on 6L  O2 via [place] around the clock, diabetes, history of AVM, chronic diastolic CHF, hypertension, hyperlipidemia now being admitted with:  1. Symptomatic anemia secondary to upper GI bleed, h/o AVM, iron deficiency Patient transfused hemoglobin is currently stable  2. AKI on CKD, mild currently stable 3. History of home O2 dependent COPD-continue O2, nebulizers, Symbicort, Spiriva 4. History of hyperlipidemia-continue Lipitor 5. History of diabetes-continue Lantus and regular insulin sliding scale coverage every 4 hours.  metformin being held 6. History of hypertension, CHF-continue lisinopril, torsemide, metoprolol 7. History of depression-continue Celexa 8. History of chronic pain-continue Ultracet, Neurontin     Code Status Orders        Start     Ordered   10/21/16 0415  Full code  Continuous     10/21/16 0414    Code Status History    Date Active Date Inactive Code Status Order ID Comments User Context   10/09/2016 11:52 AM 10/12/2016  4:49 PM Full Code [id number]  Lytle Butte, MD ED   10/09/2016 11:05 AM 10/09/2016 11:52 AM DNR [id number]  Lytle Butte, MD ED   07/24/2016  8:06 AM 07/27/2016  5:59 PM DNR [id number]  Harrie Foreman, MD Inpatient   02/10/2016  3:28 PM 02/12/2016  8:09 PM DNR [id number]  Loletha Grayer, MD ED   10/26/2015  3:14 AM 11/01/2015  8:05 PM Full Code [id number]  Saundra Shelling, MD Inpatient   08/20/2015  4:41 AM 08/22/2015  4:49 PM Full Code [id number]  Lytle Butte, MD ED   08/20/2015  2:54 AM 08/20/2015  4:41 AM Full Code [id number]  Lytle Butte, MD ED   07/27/2015  8:40 PM 07/29/2015  6:29 PM Full Code [id number]  Lytle Butte, MD ED   06/30/2015 12:40 PM 07/03/2015  3:26 PM Full Code [id number]  Bettey Costa, MD Inpatient   05/19/2015  2:59 PM 05/23/2015  4:21 PM Full Code [id number]  Max Sane, MD Inpatient   05/06/2015  5:14 PM 05/13/2015  5:06 PM Full Code  [id number]  Epifanio Lesches, MD ED   05/03/2015 12:29 PM 05/04/2015  7:20 PM Full Code [id number]  Idelle Crouch, MD Inpatient   04/22/2015  4:50 AM 04/27/2015  7:35 PM Full Code [id number]  Harrie Foreman, MD Inpatient   04/17/2015  7:16 PM 04/19/2015  5:16 PM Full Code [id number]  Clovis Fredrickson, MD Inpatient   04/10/2015  9:12 AM 04/17/2015  7:08 PM Full Code [id number]  Demetrios Loll, MD Inpatient   03/09/2015 12:18 PM 03/12/2015  6:45 PM Full Code [id number]  Henreitta Leber, MD Inpatient   08/24/2014  9:41 PM 08/25/2014  5:20 PM Full Code [id number]  Ivor Costa, MD ED   04/02/2014  8:11 PM 04/04/2014  5:10 PM Full Code [id number]  Velvet Bathe, MD Inpatient   03/18/2014  3:44 PM 03/22/2014  2:06 PM Full Code [id number]  Verlee Monte, MD Inpatient   12/04/2013  6:13 AM 12/10/2013  7:54 PM Full Code [id number]  Rise Patience, MD Inpatient   10/15/2013  8:11 PM 10/18/2013  6:40 PM Full Code [id number]  Fuller Plan, MD Inpatient   09/08/2013 12:43 AM 09/10/2013  3:30 PM Full Code [id number]  Toy Baker, MD Inpatient   08/19/2013  5:48 PM 08/22/2013  3:51 PM Full Code [id number]  Orson Eva, MD ED   05/15/2013  1:26 AM 05/17/2013  4:59 PM Full Code [id number]  Theodis Blaze, MD Inpatient   12/04/2012  5:23 PM 12/08/2012  4:56 PM Full Code [id number]  Janece Canterbury, MD Inpatient   05/17/2012 12:56 PM 05/21/2012  6:09 PM Full Code [id number]  Campbell Lerner, RN ED   04/06/2012  7:35 PM 04/14/2012  5:23 PM Full Code [id number]  Marylou Mccoy, RN Inpatient   03/27/2012  4:44 AM 03/28/2012  5:56 PM Full Code [id number]  Theotis Barrio, RN Inpatient           Consults  gi   DVT Prophylaxis  scd's  Lab Results  Component Value Date   PLT 255 10/20/2016     Time Spent in minutes   77min  Greater than 50% of time spent in care coordination and counseling patient regarding the condition and plan of care.   Dustin Flock M.D on 10/21/2016 at 3:27 PM  Between 7am to 6pm - Pager - [phone]  After 6pm go to  [email or web address] - password [email or web address] [facility]   Office  [phone]

## 2016-10-22 ENCOUNTER — Encounter: Payer: Self-pay | Admitting: Anesthesiology

## 2016-10-22 LAB — BASIC METABOLIC PANEL
Anion gap: 4 — ABNORMAL LOW (ref 5–15)
BUN: 27 mg/dL — ABNORMAL HIGH (ref 6–20)
CALCIUM: 7.7 mg/dL — AB (ref 8.9–10.3)
CHLORIDE: 101 mmol/L (ref 101–111)
CO2: 34 mmol/L — ABNORMAL HIGH (ref 22–32)
CREATININE: 1.37 mg/dL — AB (ref 0.61–1.24)
GFR, EST AFRICAN AMERICAN: 57 mL/min — AB (ref >60)
GFR, EST NON AFRICAN AMERICAN: 49 mL/min — AB (ref >60)
Glucose, Bld: 238 mg/dL — ABNORMAL HIGH (ref 65–99)
Potassium: 4.1 mmol/L (ref 3.5–5.1)
SODIUM: 139 mmol/L (ref 135–145)

## 2016-10-22 LAB — GLUCOSE, CAPILLARY
GLUCOSE-CAPILLARY: 214 mg/dL — AB (ref 65–99)
GLUCOSE-CAPILLARY: 301 mg/dL — AB (ref 65–99)
Glucose-Capillary: 192 mg/dL — ABNORMAL HIGH (ref 65–99)
Glucose-Capillary: 198 mg/dL — ABNORMAL HIGH (ref 65–99)
Glucose-Capillary: 207 mg/dL — ABNORMAL HIGH (ref 65–99)
Glucose-Capillary: 246 mg/dL — ABNORMAL HIGH (ref 65–99)
Glucose-Capillary: 279 mg/dL — ABNORMAL HIGH (ref 65–99)

## 2016-10-22 LAB — CBC
HCT: 24.2 % — ABNORMAL LOW (ref 40.0–52.0)
HEMOGLOBIN: 8 g/dL — AB (ref 13.0–18.0)
MCH: 29.3 pg (ref 26.0–34.0)
MCHC: 33.1 g/dL (ref 32.0–36.0)
MCV: 88.3 fL (ref 80.0–100.0)
PLATELETS: 189 10*3/uL (ref 150–440)
RBC: 2.74 MIL/uL — ABNORMAL LOW (ref 4.40–5.90)
RDW: 16.9 % — ABNORMAL HIGH (ref 11.5–14.5)
WBC: 6.7 10*3/uL (ref 3.8–10.6)
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
## 2016-10-22 NOTE — Progress Notes (Signed)
PT Cancellation Note  Patient Details Name: [name] MRN: [id number] DOB: 08/26/1941   Cancelled Treatment:    Reason Eval/Treat Not Completed: Patient declined, no reason specified (States he is just too tired, will try later as time allows)   [name] 10/22/2016, 10:34 AM   [name], PT MS Acute Rehab Dept. Number: [place] and [place]
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
## 2016-10-22 NOTE — Progress Notes (Signed)
[place] at [facility]                                                                                                                                                                                  Patient Demographics   [name], is a 76 y.o. male, DOB - 04-24-1941, [id number]  Admit date - 10/20/2016   Admitting Physician Harvie Bridge, DO  Outpatient Primary MD for the patient is ROBERTS, Sharol Given, MD   LOS - 1  Subjective: Pt denies any complaints.    Review of Systems:   CONSTITUTIONAL: No documented fever. No fatigue, weakness. No weight gain, no weight loss.  EYES: No blurry or double vision.  ENT: No tinnitus. No postnasal drip. No redness of the oropharynx.  RESPIRATORY: No cough, no wheeze, no hemoptysis. Chronic shortness of breath  CARDIOVASCULAR: No chest pain. No orthopnea. No palpitations. No syncope.  GASTROINTESTINAL: No nausea, no vomiting or diarrhea. No abdominal pain. No melena or hematochezia.  GENITOURINARY: No dysuria or hematuria.  ENDOCRINE: No polyuria or nocturia. No heat or cold intolerance.  HEMATOLOGY: No anemia. No bruising. No bleeding.  INTEGUMENTARY: No rashes. No lesions.  MUSCULOSKELETAL: No arthritis. No swelling. No gout.  NEUROLOGIC: No numbness, tingling, or ataxia. No seizure-type activity.  PSYCHIATRIC: No anxiety. No insomnia. No ADD.    Vitals:   Vitals:   10/21/16 1400 10/21/16 2007 10/22/16 0519 10/22/16 1342  BP: (!) 112/59 (!) 108/59 (!) 101/59 (!) 105/52  Pulse: (!) 103 94 71 82  Resp: 16 18 19    Temp:  97.6 F (36.4 C) 97.7 F (36.5 C) 97.9 F (36.6 C)  TempSrc:  Oral Oral Oral  SpO2: 100% 100% 98% 100%  Weight:      Height:        Wt Readings from Last 3 Encounters:  10/21/16 117.1 kg (258 lb 2.5 oz)  10/09/16 122.5 kg (270 lb)  08/02/16 121.1 kg (267 lb)     Intake/Output Summary (Last 24 hours) at 10/22/16 1723 Last data filed at 10/22/16 1700  Gross per 24  hour  Intake          2278.58 ml  Output             2050 ml  Net           228.58 ml    Physical Exam:   GENERAL: Pleasant-appearing in no apparent distress.  HEAD, EYES, EARS, NOSE AND THROAT: Atraumatic, normocephalic. Extraocular muscles are intact. Pupils equal and reactive to light. Sclerae anicteric. No conjunctival injection. No oro-pharyngeal erythema.  NECK: Supple. There is no jugular venous distention. No bruits, no lymphadenopathy, no thyromegaly.  HEART: Regular rate and rhythm,. No murmurs, no rubs, no clicks.  LUNGSDecreased breath sounds bilaterally . No rales or rhonchi. No wheezes.  ABDOMEN: Soft, flat, nontender, nondistended. Has good bowel sounds. No hepatosplenomegaly appreciated.  EXTREMITIES: No evidence of any cyanosis, clubbing, or peripheral edema.  +2 pedal and radial pulses bilaterally.  NEUROLOGIC: The patient is alert, awake, and oriented x3 with no focal motor or sensory deficits appreciated bilaterally.  SKIN: Moist and warm with no rashes appreciated.  Psych: Not anxious, depressed LN: No inguinal LN enlargement    Antibiotics   Anti-infectives    None      Medications   Scheduled Meds: . atorvastatin  40 mg Oral QHS  . cholecalciferol  1,000 Units Oral Daily  . citalopram  40 mg Oral Daily  . ferrous sulfate  325 mg Oral TID WC  . gabapentin  300 mg Oral TID  . insulin aspart  0-20 Units Subcutaneous Q4H  . insulin glargine  20 Units Subcutaneous BID  . lisinopril  2.5 mg Oral Daily  . mouth rinse  15 mL Mouth Rinse BID  . metoprolol tartrate  25 mg Oral BID  . mometasone-formoterol  2 puff Inhalation BID  . [START ON 10/24/2016] pantoprazole  40 mg Intravenous Q12H  . sodium chloride flush  3 mL Intravenous Q12H  . sucralfate  1 g Oral TID WC & HS  . tiotropium  18 mcg Inhalation Daily  . torsemide  20 mg Oral BID   Continuous Infusions: . sodium chloride 30 mL/hr at 10/22/16 0511  . pantoprozole (PROTONIX) infusion 8 mg/hr  (10/22/16 1526)   PRN Meds:.acetaminophen, albuterol, ALPRAZolam, carbamide peroxide, ondansetron **OR** ondansetron (ZOFRAN) IV, traMADol-acetaminophen, zolpidem   Data Review:   Micro Results No results found for this or any previous visit (from the past 240 hour(s)).  Radiology Reports Dg Chest Portable 1 View  Result Date: 10/20/2016 CLINICAL DATA:  Shortness of breath, chest pain. EXAM: PORTABLE CHEST 1 VIEW COMPARISON:  Radiographs of October 09, 2016. FINDINGS: Stable cardiomegaly. Status post coronary artery bypass graft. No pneumothorax is noted. Right lung is clear. Stable left basilar opacity is noted concerning for atelectasis or infiltrate with possible associated pleural effusion. Bony thorax is unremarkable. IMPRESSION: Stable left basilar opacity as described above. Electronically Signed   By: Marijo Conception, M.D.   On: 10/20/2016 20:00   Dg Chest Portable 1 View  Result Date: 10/09/2016 CLINICAL DATA:  Weakness, shortness of breath, and GI bleeding for 2 weeks, history COPD, abdominal aortic aneurysm, coronary artery disease, hypertension, type II diabetes mellitus, former smoker EXAM: PORTABLE CHEST 1 VIEW COMPARISON:  Portable exam 0637 hours compared to 07/24/2016 FINDINGS: Upper normal heart size post CABG. Atherosclerotic calcification aorta. Hiatal hernia, noted on prior CT from 2016. Mediastinal contours and pulmonary vascularity otherwise normal. Stable pleural thickening at LEFT apex. Underlying emphysematous changes with chronic atelectasis versus consolidation in LEFT lower lobe. Linear scarring LEFT upper lobe. No definite pleural effusion or pneumothorax. Bones demineralized. IMPRESSION: Changes of COPD with LEFT upper lobe scarring and chronic atelectasis versus consolidation in LEFT lower lobe. Hiatal hernia. Post CABG. Electronically Signed   By: Lavonia Dana M.D.   On: 10/09/2016 07:25     CBC  Recent Labs Lab 10/20/16 2142 10/21/16 0744 10/22/16 0431   WBC 10.4  --  6.7  HGB 6.7* 8.1* 8.0*  HCT 21.3* 24.5* 24.2*  PLT 255  --  189  MCV 91.9  --  88.3  MCH  28.9  --  29.3  MCHC 31.4*  --  33.1  RDW 17.1*  --  16.9*  LYMPHSABS 1.3  --   --   MONOABS 0.7  --   --   EOSABS 0.1  --   --   BASOSABS 0.1  --   --     Chemistries   Recent Labs Lab 10/20/16 2142 10/22/16 0431  NA 139 139  K 4.5 4.1  CL 98* 101  CO2 33* 34*  GLUCOSE 249* 238*  BUN 39* 27*  CREATININE 1.49* 1.37*  CALCIUM 8.1* 7.7*  AST 18  --   ALT 12*  --   ALKPHOS 90  --   BILITOT 0.7  --    ------------------------------------------------------------------------------------------------------------------ estimated creatinine clearance is 64.4 mL/min (by C-G formula based on SCr of 1.37 mg/dL (H)). ------------------------------------------------------------------------------------------------------------------ No results for input(s): HGBA1C in the last 72 hours. ------------------------------------------------------------------------------------------------------------------ No results for input(s): CHOL, HDL, LDLCALC, TRIG, CHOLHDL, LDLDIRECT in the last 72 hours. ------------------------------------------------------------------------------------------------------------------ No results for input(s): TSH, T4TOTAL, T3FREE, THYROIDAB in the last 72 hours.  Invalid input(s): FREET3 ------------------------------------------------------------------------------------------------------------------  Recent Labs  10/21/16 1628  FERRITIN 43  TIBC 388  IRON 177    Coagulation profile No results for input(s): INR, PROTIME in the last 168 hours.  No results for input(s): DDIMER in the last 72 hours.  Cardiac Enzymes  Recent Labs Lab 10/20/16 2142  TROPONINI 0.03*   ------------------------------------------------------------------------------------------------------------------ Invalid input(s): POCBNP    Assessment & Plan   This is a 76 y.o.  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 Symptomatic anemia secondary to upper GI bleed, h/o AVM, iron deficiency,hb  Improved from 6.7 o 8.1 Patient transfused  2 units transfusions.hemoglobin is currently stable .seen by GI.Given his cardiopulmonary status, we have to be careful in putting him through GI procedures. GI to discuss with anesthesia before proceeding with EGD/push enteroscopy. If  2. AKI on CKD, mild currently stable 3. History of home O2 dependent COPD-continue O2, nebulizers, Symbicort, Spiriva 4. History of hyperlipidemia-continue Lipitor 5. History of diabetes-continue Lantus and regular insulin sliding scale coverage every 4 hours.  metformin being held 6. History of hypertension, CHF-continue lisinopril, torsemide, metoprolol 7. History of depression-continue Celexa 8. History of chronic pain-continue Ultracet, Neurontin     Code Status Orders        Start     Ordered   10/21/16 0415  Full code  Continuous     10/21/16 0414    Code Status History    Date Active Date Inactive Code Status Order ID Comments User Context   10/09/2016 11:52 AM 10/12/2016  4:49 PM Full Code [id number]  Lytle Butte, MD ED   10/09/2016 11:05 AM 10/09/2016 11:52 AM DNR [id number]  Lytle Butte, MD ED   07/24/2016  8:06 AM 07/27/2016  5:59 PM DNR [id number]  Harrie Foreman, MD Inpatient   02/10/2016  3:28 PM 02/12/2016  8:09 PM DNR [id number]  Loletha Grayer, MD ED   10/26/2015  3:14 AM 11/01/2015  8:05 PM Full Code [id number]  Saundra Shelling, MD Inpatient   08/20/2015  4:41 AM 08/22/2015  4:49 PM Full Code [id number]  Lytle Butte, MD ED   08/20/2015  2:54 AM 08/20/2015  4:41 AM Full Code [id number]  Lytle Butte, MD ED   07/27/2015  8:40 PM 07/29/2015  6:29 PM Full Code [id number]  Lytle Butte, MD ED   06/30/2015 12:40 PM 07/03/2015  3:26 PM Full Code  [id number]  Bettey Costa, MD Inpatient   05/19/2015  2:59 PM 05/23/2015  4:21 PM Full Code [id number]  Max Sane, MD Inpatient   05/06/2015  5:14 PM 05/13/2015  5:06 PM Full Code [id number]  Epifanio Lesches, MD ED   05/03/2015 12:29 PM 05/04/2015  7:20 PM Full Code [id number]  Idelle Crouch, MD Inpatient   04/22/2015  4:50 AM 04/27/2015  7:35 PM Full Code [id number]  Harrie Foreman, MD Inpatient   04/17/2015  7:16 PM 04/19/2015  5:16 PM Full Code [id number]  Clovis Fredrickson, MD Inpatient   04/10/2015  9:12 AM 04/17/2015  7:08 PM Full Code [id number]  Demetrios Loll, MD Inpatient   03/09/2015 12:18 PM 03/12/2015  6:45 PM Full Code [id number]  Henreitta Leber, MD Inpatient   08/24/2014  9:41 PM 08/25/2014  5:20 PM Full Code [id number]  Ivor Costa, MD ED   04/02/2014  8:11 PM 04/04/2014  5:10 PM Full Code [id number]  Velvet Bathe, MD Inpatient   03/18/2014  3:44 PM 03/22/2014  2:06 PM Full Code [id number]  Verlee Monte, MD Inpatient   12/04/2013  6:13 AM 12/10/2013  7:54 PM Full Code [id number]  Rise Patience, MD Inpatient   10/15/2013  8:11 PM 10/18/2013  6:40 PM Full Code [id number]  Fuller Plan, MD Inpatient   09/08/2013 12:43 AM 09/10/2013  3:30 PM Full Code [id number]  Toy Baker, MD Inpatient   08/19/2013  5:48 PM 08/22/2013  3:51 PM Full Code [id number]  Orson Eva, MD ED   05/15/2013  1:26 AM 05/17/2013  4:59 PM Full Code [id number]  Theodis Blaze, MD Inpatient   12/04/2012  5:23 PM 12/08/2012  4:56 PM Full Code [id number]  Janece Canterbury, MD Inpatient   05/17/2012 12:56 PM 05/21/2012  6:09 PM Full Code [id number]  Campbell Lerner, RN ED   04/06/2012  7:35 PM 04/14/2012  5:23 PM Full Code [id number]  Marylou Mccoy, RN Inpatient   03/27/2012  4:44 AM 03/28/2012  5:56 PM Full Code [id number]  Theotis Barrio, RN Inpatient           Consults  gi   DVT Prophylaxis  scd's  Lab Results  Component Value Date   PLT 189 10/22/2016     Time Spent in minutes   41min  Greater than 50% of time spent in care coordination and  counseling patient regarding the condition and plan of care.   Epifanio Lesches M.D on 10/22/2016 at 5:23 PM  Between 7am to 6pm - Pager - [phone]  After 6pm go to [email or web address] - password [email or web address] [facility]   Office  [phone]

## 2016-10-23 ENCOUNTER — Encounter
Admission: EM | Disposition: A | Discharge status: Home / Self Care | Payer: Self-pay | Source: Home / Self Care | Attending: Internal Medicine

## 2016-10-23 LAB — CBC
HCT: 26.1 % — ABNORMAL LOW (ref 40.0–52.0)
Hemoglobin: 8.5 g/dL — ABNORMAL LOW (ref 13.0–18.0)
MCH: 29.4 pg (ref 26.0–34.0)
MCHC: 32.8 g/dL (ref 32.0–36.0)
MCV: 89.6 fL (ref 80.0–100.0)
PLATELETS: 219 10*3/uL (ref 150–440)
RBC: 2.91 MIL/uL — ABNORMAL LOW (ref 4.40–5.90)
RDW: 16.4 % — AB (ref 11.5–14.5)
WBC: 7.9 10*3/uL (ref 3.8–10.6)

## 2016-10-23 LAB — GLUCOSE, CAPILLARY
GLUCOSE-CAPILLARY: 225 mg/dL — AB (ref 65–99)
Glucose-Capillary: 270 mg/dL — ABNORMAL HIGH (ref 65–99)
Glucose-Capillary: 242 mg/dL — ABNORMAL HIGH (ref 65–99)
Glucose-Capillary: 130 mg/dL — ABNORMAL HIGH (ref 65–99)
Glucose-Capillary: 192 mg/dL — ABNORMAL HIGH (ref 65–99)

## 2016-10-23 SURGERY — ESOPHAGOGASTRODUODENOSCOPY (EGD) WITH PROPOFOL
Anesthesia: General

## 2016-10-23 NOTE — Consult Note (Signed)
  [name], MD [address] [place], [place] [address] Phone: [phone] Fax : [phone]   Subjective: Denies any complaints. Thought he is going to have EGD today and was kept NPO. Hb stable   Objective: Vital signs in last 24 hours: Vitals:   10/22/16 1342 10/22/16 2006 10/23/16 0504 10/23/16 0726  BP: (!) 105/52 108/61 103/63 101/61  Pulse: 82 92 79 74  Resp:  16  17  Temp: 97.9 F (36.6 C)  97.4 F (36.3 C) 97.7 F (36.5 C)  TempSrc: Oral  Oral Oral  SpO2: 100% 100% 100% 98%  Weight:      Height:       Weight change:   Intake/Output Summary (Last 24 hours) at 10/23/16 1507 Last data filed at 10/23/16 1305  Gross per 24 hour  Intake             2745 ml  Output             1350 ml  Net             1395 ml     Exam: Heart:: Regular rate and rhythm or S1S2 present Lungs: clear to auscultation Abdomen: soft, nontender, normal bowel sounds   Lab Results: @LABTEST2 @ Micro Results: No results found for this or any previous visit (from the past 240 hour(s)). Studies/Results: No results found. Medications: I have reviewed the patient's current medications. Scheduled Meds: . atorvastatin  40 mg Oral QHS  . cholecalciferol  1,000 Units Oral Daily  . citalopram  40 mg Oral Daily  . ferrous sulfate  325 mg Oral TID WC  . gabapentin  300 mg Oral TID  . insulin aspart  0-20 Units Subcutaneous Q4H  . insulin glargine  20 Units Subcutaneous BID  . lisinopril  2.5 mg Oral Daily  . mouth rinse  15 mL Mouth Rinse BID  . metoprolol tartrate  25 mg Oral BID  . mometasone-formoterol  2 puff Inhalation BID  . [START ON 10/24/2016] pantoprazole  40 mg Intravenous Q12H  . sodium chloride flush  3 mL Intravenous Q12H  . sucralfate  1 g Oral TID WC & HS  . tiotropium  18 mcg Inhalation Daily  . torsemide  20 mg Oral BID   Continuous Infusions: . sodium chloride 30 mL/hr at 10/23/16 0538  . pantoprozole (PROTONIX) infusion 8 mg/hr (10/23/16 1305)    PRN Meds:.acetaminophen, albuterol, ALPRAZolam, carbamide peroxide, ondansetron **OR** ondansetron (ZOFRAN) IV, traMADol-acetaminophen, zolpidem   Assessment/Plan: Active Problems:   Symptomatic anemia  [name] is a 76 y.o. 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 There is no evidence of active GI bleed. Given his cardiopulmonary status, we have to be careful in putting him through GI procedures. He is evaluated by anesthesia yesterday. We will plan for EGD/puch enteroscopy tomorrow. If we see evidence of AVMs this time, it may be worth trying subQ octreotide to prevent rebleeding from AVMs. He will need to follow with GI as outpt regularly to monitor his Hb and PRN blood transfusions. His last ferritin during this admission was 43.   We will continue to follow him with you   LOS: 2 days   Rohini Vanga 10/23/2016, 3:07 PM

## 2016-10-23 NOTE — Progress Notes (Signed)
Pt angry and belligerent to this RN after being told that he would not have his EGD today, it will be tomorrow.  All am medications prepared, pt refusing to take mediations after opening all the packages.  Pt cussing, redirected.

## 2016-10-23 NOTE — Progress Notes (Signed)
[place] at [facility]                                                                                                                                                                                  Patient Demographics   [name], is a 76 y.o. male, DOB - 05/31/1941, [id number]  Admit date - 10/20/2016   Admitting Physician Harvie Bridge, DO  Outpatient Primary MD for the patient is ROBERTS, Sharol Given, MD   LOS - 2  Subjective: Patient very angry and using bad language about doctors and registered nurses because he is not started on diet. Now and then just found out that EGD was canceled and started on clear liquids. Refused all the medications including insulin this morning. Explained to him that hemoglobin 8.5 Gastroenterology  doctor will be there to talk to him and we're going to start him on the diet. Patient does not want clear liquids and wants  on regular diet today . if patient needs EGD gastroenterologist can order nothing by mouth after midnight. Patient was seen by me yesterday and today. But he does not remember seeing me yesterday. And he also does not number seeing gastroenterologist yesterday.    Review of Systems:   CONSTITUTIONAL: No documented fever. No fatigue, weakness. No weight gain, no weight loss.  EYES: No blurry or double vision.  ENT: No tinnitus. No postnasal drip. No redness of the oropharynx.  RESPIRATORY: No cough, no wheeze, no hemoptysis. Chronic shortness of breath  CARDIOVASCULAR: No chest pain. No orthopnea. No palpitations. No syncope.  GASTROINTESTINAL: No nausea, no vomiting or diarrhea. No abdominal pain. No melena or hematochezia. Complains of left upper quadrant abdominal pain. GENITOURINARY: No dysuria or hematuria.  ENDOCRINE: No polyuria or nocturia. No heat or cold intolerance.  HEMATOLOGY: No anemia. No bruising. No bleeding.  INTEGUMENTARY: No rashes. No lesions.  MUSCULOSKELETAL: No arthritis.  No swelling. No gout.  NEUROLOGIC: No numbness, tingling, or ataxia. No seizure-type activity.  PSYCHIATRIC: No anxiety. No insomnia. No ADD.    Vitals:   Vitals:   10/22/16 1342 10/22/16 2006 10/23/16 0504 10/23/16 0726  BP: (!) 105/52 108/61 103/63 101/61  Pulse: 82 92 79 74  Resp:  16  17  Temp: 97.9 F (36.6 C)  97.4 F (36.3 C) 97.7 F (36.5 C)  TempSrc: Oral  Oral Oral  SpO2: 100% 100% 100% 98%  Weight:      Height:        Wt Readings from Last 3 Encounters:  10/21/16 117.1 kg (258 lb 2.5 oz)  10/09/16 122.5 kg (270 lb)  08/02/16 121.1 kg (267 lb)     Intake/Output  Summary (Last 24 hours) at 10/23/16 1240 Last data filed at 10/23/16 0729  Gross per 24 hour  Intake             2557 ml  Output             1750 ml  Net              807 ml    Physical Exam:   GENERAL: Pleasant-appearing in no apparent distress.  HEAD, EYES, EARS, NOSE AND THROAT: Atraumatic, normocephalic. Extraocular muscles are intact. Pupils equal and reactive to light. Sclerae anicteric. No conjunctival injection. No oro-pharyngeal erythema.  NECK: Supple. There is no jugular venous distention. No bruits, no lymphadenopathy, no thyromegaly.  HEART: Regular rate and rhythm,. No murmurs, no rubs, no clicks.  LUNGSDecreased breath sounds bilaterally . No rales or rhonchi. No wheezes.  ABDOMEN: Soft, flat, nontender, nondistended. Has good bowel sounds. No hepatosplenomegaly appreciated.  EXTREMITIES: No evidence of any cyanosis, clubbing, or peripheral edema.  +2 pedal and radial pulses bilaterally.  NEUROLOGIC: The patient is alert, awake, and oriented x3 with no focal motor or sensory deficits appreciated bilaterally.  SKIN: Moist and warm with no rashes appreciated.  Psych: Not anxious, depressed LN: No inguinal LN enlargement    Antibiotics   Anti-infectives    None      Medications   Scheduled Meds: . atorvastatin  40 mg Oral QHS  . cholecalciferol  1,000 Units Oral Daily  .  citalopram  40 mg Oral Daily  . ferrous sulfate  325 mg Oral TID WC  . gabapentin  300 mg Oral TID  . insulin aspart  0-20 Units Subcutaneous Q4H  . insulin glargine  20 Units Subcutaneous BID  . lisinopril  2.5 mg Oral Daily  . mouth rinse  15 mL Mouth Rinse BID  . metoprolol tartrate  25 mg Oral BID  . mometasone-formoterol  2 puff Inhalation BID  . [START ON 10/24/2016] pantoprazole  40 mg Intravenous Q12H  . sodium chloride flush  3 mL Intravenous Q12H  . sucralfate  1 g Oral TID WC & HS  . tiotropium  18 mcg Inhalation Daily  . torsemide  20 mg Oral BID   Continuous Infusions: . sodium chloride 30 mL/hr at 10/23/16 0538  . pantoprozole (PROTONIX) infusion 8 mg/hr (10/23/16 0148)   PRN Meds:.acetaminophen, albuterol, ALPRAZolam, carbamide peroxide, ondansetron **OR** ondansetron (ZOFRAN) IV, traMADol-acetaminophen, zolpidem   Data Review:   Micro Results No results found for this or any previous visit (from the past 240 hour(s)).  Radiology Reports Dg Chest Portable 1 View  Result Date: 10/20/2016 CLINICAL DATA:  Shortness of breath, chest pain. EXAM: PORTABLE CHEST 1 VIEW COMPARISON:  Radiographs of October 09, 2016. FINDINGS: Stable cardiomegaly. Status post coronary artery bypass graft. No pneumothorax is noted. Right lung is clear. Stable left basilar opacity is noted concerning for atelectasis or infiltrate with possible associated pleural effusion. Bony thorax is unremarkable. IMPRESSION: Stable left basilar opacity as described above. Electronically Signed   By: Marijo Conception, M.D.   On: 10/20/2016 20:00   Dg Chest Portable 1 View  Result Date: 10/09/2016 CLINICAL DATA:  Weakness, shortness of breath, and GI bleeding for 2 weeks, history COPD, abdominal aortic aneurysm, coronary artery disease, hypertension, type II diabetes mellitus, former smoker EXAM: PORTABLE CHEST 1 VIEW COMPARISON:  Portable exam 0637 hours compared to 07/24/2016 FINDINGS: Upper normal heart size  post CABG. Atherosclerotic calcification aorta. Hiatal hernia, noted on prior CT from  2016. Mediastinal contours and pulmonary vascularity otherwise normal. Stable pleural thickening at LEFT apex. Underlying emphysematous changes with chronic atelectasis versus consolidation in LEFT lower lobe. Linear scarring LEFT upper lobe. No definite pleural effusion or pneumothorax. Bones demineralized. IMPRESSION: Changes of COPD with LEFT upper lobe scarring and chronic atelectasis versus consolidation in LEFT lower lobe. Hiatal hernia. Post CABG. Electronically Signed   By: Lavonia Dana M.D.   On: 10/09/2016 07:25     CBC  Recent Labs Lab 10/20/16 2142 10/21/16 0744 10/22/16 0431 10/23/16 0823  WBC 10.4  --  6.7 7.9  HGB 6.7* 8.1* 8.0* 8.5*  HCT 21.3* 24.5* 24.2* 26.1*  PLT 255  --  189 219  MCV 91.9  --  88.3 89.6  MCH 28.9  --  29.3 29.4  MCHC 31.4*  --  33.1 32.8  RDW 17.1*  --  16.9* 16.4*  LYMPHSABS 1.3  --   --   --   MONOABS 0.7  --   --   --   EOSABS 0.1  --   --   --   BASOSABS 0.1  --   --   --     Chemistries   Recent Labs Lab 10/20/16 2142 10/22/16 0431  NA 139 139  K 4.5 4.1  CL 98* 101  CO2 33* 34*  GLUCOSE 249* 238*  BUN 39* 27*  CREATININE 1.49* 1.37*  CALCIUM 8.1* 7.7*  AST 18  --   ALT 12*  --   ALKPHOS 90  --   BILITOT 0.7  --    ------------------------------------------------------------------------------------------------------------------ estimated creatinine clearance is 64.4 mL/min (by C-G formula based on SCr of 1.37 mg/dL (H)). ------------------------------------------------------------------------------------------------------------------ No results for input(s): HGBA1C in the last 72 hours. ------------------------------------------------------------------------------------------------------------------ No results for input(s): CHOL, HDL, LDLCALC, TRIG, CHOLHDL, LDLDIRECT in the last 72  hours. ------------------------------------------------------------------------------------------------------------------ No results for input(s): TSH, T4TOTAL, T3FREE, THYROIDAB in the last 72 hours.  Invalid input(s): FREET3 ------------------------------------------------------------------------------------------------------------------  Recent Labs  10/21/16 1628  FERRITIN 43  TIBC 388  IRON 177    Coagulation profile No results for input(s): INR, PROTIME in the last 168 hours.  No results for input(s): DDIMER in the last 72 hours.  Cardiac Enzymes  Recent Labs Lab 10/20/16 2142  TROPONINI 0.03*   ------------------------------------------------------------------------------------------------------------------ Invalid input(s): POCBNP    Assessment & Plan   This is a 76 y.o. male with a history of CAD, end stage COPD on 6L  O2 via [place] around the clock, diabetes, history of AVM, chronic diastolic CHF, hypertension, hyperlipidemia now being admitted with:  1. Symptomatic anemia secondary to upper GI bleed, h/o AVM, iron deficiency,hb  Improved from 6.7 8.5. No indication for further transfusion. On Protonix drip. GI  Cancelled  EGD procedure today and patient very angry about that. Gastroenterologist.  appreciate gastroenterology talking to the patient patient in person to decide if patient needs EGD  Or not ,so that we are on the same page. Patient transfused  2 units transfusions.hemoglobin is currently stable .  2. AKI on CKD, mild currently stable.  3. History of home O2 dependent COPD-continue O2, nebulizers, Symbicort, Spiriva.  has no wheezing today.  4. History of hyperlipidemia-continue Lipitor  5. History of diabetes-continue Lantus and regular insulin sliding scale coverage every 4 hours.  metformin being held  6. History of hypertension, CHF-continue lisinopril, torsemide, metoprolol  7. History of depression-continue Celexa  8. History of chronic  pain-continue Ultracet, Neurontin     Code Status Orders  Start     Ordered   10/21/16 0415  Full code  Continuous     10/21/16 0414    Code Status History    Date Active Date Inactive Code Status Order ID Comments User Context   10/09/2016 11:52 AM 10/12/2016  4:49 PM Full Code [id number]  Lytle Butte, MD ED   10/09/2016 11:05 AM 10/09/2016 11:52 AM DNR [id number]  Lytle Butte, MD ED   07/24/2016  8:06 AM 07/27/2016  5:59 PM DNR [id number]  Harrie Foreman, MD Inpatient   02/10/2016  3:28 PM 02/12/2016  8:09 PM DNR [id number]  Loletha Grayer, MD ED   10/26/2015  3:14 AM 11/01/2015  8:05 PM Full Code [id number]  Saundra Shelling, MD Inpatient   08/20/2015  4:41 AM 08/22/2015  4:49 PM Full Code [id number]  Lytle Butte, MD ED   08/20/2015  2:54 AM 08/20/2015  4:41 AM Full Code [id number]  Lytle Butte, MD ED   07/27/2015  8:40 PM 07/29/2015  6:29 PM Full Code [id number]  Lytle Butte, MD ED   06/30/2015 12:40 PM 07/03/2015  3:26 PM Full Code [id number]  Bettey Costa, MD Inpatient   05/19/2015  2:59 PM 05/23/2015  4:21 PM Full Code [id number]  Max Sane, MD Inpatient   05/06/2015  5:14 PM 05/13/2015  5:06 PM Full Code [id number]  Epifanio Lesches, MD ED   05/03/2015 12:29 PM 05/04/2015  7:20 PM Full Code [id number]  Idelle Crouch, MD Inpatient   04/22/2015  4:50 AM 04/27/2015  7:35 PM Full Code [id number]  Harrie Foreman, MD Inpatient   04/17/2015  7:16 PM 04/19/2015  5:16 PM Full Code [id number]  Clovis Fredrickson, MD Inpatient   04/10/2015  9:12 AM 04/17/2015  7:08 PM Full Code [id number]  Demetrios Loll, MD Inpatient   03/09/2015 12:18 PM 03/12/2015  6:45 PM Full Code [id number]  Henreitta Leber, MD Inpatient   08/24/2014  9:41 PM 08/25/2014  5:20 PM Full Code [id number]  Ivor Costa, MD ED   04/02/2014  8:11 PM 04/04/2014  5:10 PM Full Code [id number]  Velvet Bathe, MD Inpatient   03/18/2014  3:44 PM 03/22/2014  2:06 PM Full Code [id number]  Verlee Monte, MD Inpatient   12/04/2013  6:13 AM 12/10/2013  7:54 PM Full Code  [id number]  Rise Patience, MD Inpatient   10/15/2013  8:11 PM 10/18/2013  6:40 PM Full Code [id number]  Fuller Plan, MD Inpatient   09/08/2013 12:43 AM 09/10/2013  3:30 PM Full Code [id number]  Toy Baker, MD Inpatient   08/19/2013  5:48 PM 08/22/2013  3:51 PM Full Code [id number]  Orson Eva, MD ED   05/15/2013  1:26 AM 05/17/2013  4:59 PM Full Code [id number]  Theodis Blaze, MD Inpatient   12/04/2012  5:23 PM 12/08/2012  4:56 PM Full Code [id number]  Janece Canterbury, MD Inpatient   05/17/2012 12:56 PM 05/21/2012  6:09 PM Full Code [id number]  [place], RN ED   04/06/2012  7:35 PM 04/14/2012  5:23 PM Full Code [id number]  Marylou Mccoy, RN Inpatient   03/27/2012  4:44 AM 03/28/2012  5:56 PM Full Code [id number]  Theotis Barrio, RN Inpatient           Consults  gi   DVT Prophylaxis  scd's  Lab Results  Component Value Date   PLT 219 10/23/2016     Time Spent in minutes   29min  Greater than 50% of time spent  in care coordination and counseling patient regarding the condition and plan of care.   Epifanio Lesches M.D on 10/23/2016 at 12:40 PM  Between 7am to 6pm - Pager - [phone]  After 6pm go to [email or web address] - password [email or web address] [facility]   Office  [phone]

## 2016-10-23 NOTE — Progress Notes (Signed)
Spoke with Dr. Marius Ditch, will not be doing EGD today, pt may have clear liquids, NPO after midnight.

## 2016-10-23 NOTE — Progress Notes (Signed)
PT Cancellation Note  Patient Details Name: [name] MRN: [id number] DOB: 09-16-1941   Cancelled Treatment:    Reason Eval/Treat Not Completed: Patient declined, no reason specified. Pt has EGD tomorrow so would be better to see him after the procedure.   [name] 10/23/2016, 12:53 PM   [name], PT MS Acute Rehab Dept. Number: [place] [facility] [place]

## 2016-10-24 ENCOUNTER — Inpatient Hospital Stay: Payer: Medicare HMO | Admitting: Certified Registered"

## 2016-10-24 ENCOUNTER — Encounter
Admission: EM | Disposition: A | Discharge status: Home / Self Care | Payer: Self-pay | Source: Home / Self Care | Attending: Internal Medicine

## 2016-10-24 DIAGNOSIS — K317 Polyp of stomach and duodenum: Secondary | ICD-10-CM

## 2016-10-24 DIAGNOSIS — K449 Diaphragmatic hernia without obstruction or gangrene: Secondary | ICD-10-CM

## 2016-10-24 DIAGNOSIS — D62 Acute posthemorrhagic anemia: Secondary | ICD-10-CM

## 2016-10-24 HISTORY — PX: ESOPHAGOGASTRODUODENOSCOPY (EGD) WITH PROPOFOL: SHX5813

## 2016-10-24 LAB — TYPE AND SCREEN
ABO/RH(D): B NEG
Antibody Screen: NEGATIVE
UNIT DIVISION: 0
UNIT DIVISION: 0
Unit division: 0
Unit division: 0

## 2016-10-24 LAB — GLUCOSE, CAPILLARY
GLUCOSE-CAPILLARY: 178 mg/dL — AB (ref 65–99)
GLUCOSE-CAPILLARY: 201 mg/dL — AB (ref 65–99)
Glucose-Capillary: 139 mg/dL — ABNORMAL HIGH (ref 65–99)
Glucose-Capillary: 143 mg/dL — ABNORMAL HIGH (ref 65–99)
Glucose-Capillary: 145 mg/dL — ABNORMAL HIGH (ref 65–99)
Glucose-Capillary: 145 mg/dL — ABNORMAL HIGH (ref 65–99)
Glucose-Capillary: 199 mg/dL — ABNORMAL HIGH (ref 65–99)
Glucose-Capillary: 235 mg/dL — ABNORMAL HIGH (ref 65–99)

## 2016-10-24 SURGERY — ESOPHAGOGASTRODUODENOSCOPY (EGD) WITH PROPOFOL
Anesthesia: General

## 2016-10-24 MED ORDER — LIDOCAINE 2% (20 MG/ML) 5 ML SYRINGE
INTRAMUSCULAR | Status: DC | PRN
  Administered 2016-10-24: 50 mg via INTRAVENOUS

## 2016-10-24 MED ORDER — LORAZEPAM 2 MG/ML IJ SOLN
1.0000 mg | Freq: Once | INTRAMUSCULAR | Status: AC
Start: 2016-10-24 — End: 2016-10-24
  Administered 2016-10-24: 09:00:00 1 mg via INTRAVENOUS
  Filled 2016-10-24: qty 1

## 2016-10-24 MED ORDER — SODIUM CHLORIDE 0.9 % IV SOLN: INTRAVENOUS | Status: DC

## 2016-10-24 MED ORDER — PROPOFOL 10 MG/ML IV BOLUS
INTRAVENOUS | Status: DC | PRN
  Administered 2016-10-24: 50 mg via INTRAVENOUS

## 2016-10-24 MED ORDER — GI COCKTAIL ~~LOC~~
30.0000 mL | Freq: Once | ORAL | Status: DC
  Filled 2016-10-24: qty 30

## 2016-10-24 MED ORDER — LIDOCAINE 2% (20 MG/ML) 5 ML SYRINGE
INTRAMUSCULAR | Status: AC
  Filled 2016-10-24: qty 5

## 2016-10-24 MED ORDER — KETOROLAC TROMETHAMINE 15 MG/ML IJ SOLN
15.0000 mg | Freq: Once | INTRAMUSCULAR | Status: DC
  Filled 2016-10-24: qty 1

## 2016-10-24 MED ORDER — GLYCOPYRROLATE 0.2 MG/ML IJ SOLN
INTRAMUSCULAR | Status: DC | PRN
  Administered 2016-10-24: 0.2 mg via INTRAVENOUS

## 2016-10-24 MED ORDER — GLYCOPYRROLATE 0.2 MG/ML IJ SOLN
INTRAMUSCULAR | Status: AC
  Filled 2016-10-24: qty 1

## 2016-10-24 MED ORDER — PROPOFOL 500 MG/50ML IV EMUL
INTRAVENOUS | Status: DC | PRN
  Administered 2016-10-24: 100 ug/kg/min via INTRAVENOUS

## 2016-10-24 NOTE — Transfer of Care (Signed)
Immediate Anesthesia Transfer of Care Note  Patient: [name]  Procedure(s) Performed: Procedure(s): ESOPHAGOGASTRODUODENOSCOPY (EGD) WITH PROPOFOL (N/A)  Patient Location: Endoscopy Unit  Anesthesia Type:General  Level of Consciousness: awake  Airway & Oxygen Therapy: Patient Spontanous Breathing and Patient connected to nasal cannula oxygen  Post-op Assessment: Report given to RN and Post -op Vital signs reviewed and stable  Post vital signs: Reviewed  Last Vitals:  Vitals:   10/24/16 1012 10/24/16 1048  BP: 118/66 108/90  Pulse: 74 84  Resp: 20 18  Temp: 37.1 C 36.2 C    Last Pain:  Vitals:   10/24/16 1012  TempSrc: Tympanic  PainSc:          Complications: No apparent anesthesia complications

## 2016-10-24 NOTE — Anesthesia Preprocedure Evaluation (Signed)
Anesthesia Evaluation  Patient identified by MRN, date of birth, ID band Patient awake    Reviewed: Allergy & Precautions, NPO status , Patient's Chart, lab work & pertinent test results, reviewed documented beta blocker date and time   History of Anesthesia Complications Negative for: history of anesthetic complications  Airway Mallampati: III  TM Distance: >3 FB Neck ROM: Full    Dental  (+) Edentulous Upper, Edentulous Lower   Pulmonary shortness of breath, neg sleep apnea, COPD,  COPD inhaler and oxygen dependent, former smoker,    breath sounds clear to auscultation- rhonchi (-) wheezing      Cardiovascular Exercise Tolerance: Poor hypertension, Pt. on medications and Pt. on home beta blockers + angina + CAD, + CABG, + Peripheral Vascular Disease (AAA s/p repair) and +CHF (preserved EF)   Rhythm:Regular Rate:Normal - Systolic murmurs and - Diastolic murmurs Echo [id number]: - Left ventricle: The cavity size was normal. There was mild   concentric hypertrophy. Systolic function was normal. The   estimated ejection fraction was in the range of 55% to 60%. Wall   motion was normal; there were no regional wall motion   abnormalities. Doppler parameters are consistent with abnormal   left ventricular relaxation (grade 1 diastolic dysfunction). - Aortic valve: There was mild regurgitation.    Neuro/Psych Anxiety Depression Bipolar Disorder Memory loss    GI/Hepatic Neg liver ROS, GERD  ,Active GI bleed, hx of AVMs   Endo/Other  diabetes, Insulin Dependent, Oral Hypoglycemic Agents  Renal/GU negative Renal ROS     Musculoskeletal   Abdominal (+) + obese,   Peds  Hematology  (+) anemia ,   Anesthesia Other Findings Past Medical History: No date: AAA (abdominal aortic aneurysm) ([place])     Comment: a. 12/2008 s/p 7cm, endovascular repair with               coiling right hypogastric artery  No date: Allergic rhinitis,  cause unspecified 09/10/2013: Anxiety state No date: AVM (arteriovenous malformation) of colon with* 04/16/2015: Bipolar 1 disorder, mixed, moderate (HCC) No date: CAD (coronary artery disease)     Comment: a. 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 No date: Chronic diastolic CHF (congestive heart failur*     Comment: a. 04/2015 Echo: EF 55-60%, no rwma, Gr 1 DD,               mild AI. No date: COPD (chronic obstructive pulmonary disease) (*     Comment: a. GOLD stage IV, started home O2. Severe               bullous disease of LUL. Prolonged intubation               after surgeries due to COPD. 01/14/2013: Depression No date: Diverticulosis No date: Essential hypertension 08/18/2009: Essential hypertension     Comment: Qualifier: Diagnosis of  By: Doy Mince LPN,               Megan   No date: GERD (gastroesophageal reflux disease) No date: GI bleed requiring more than 4 units of blood *     Comment: a. Hx bleeding gastric polyps, cecal & sigmoid              AVMS s/p APC 03/30/14 No date: Hyperlipidemia 08/10/2014: Insomnia 12/04/2013: Leucocytosis No date: Memory loss No date: Morbid obesity ([place]) No date: Multiple gastric polyps No date:  Recurrent Microcytic Anemia     Comment: a. presumed chronic GI blood loss. No date: Type II diabetes mellitus ([place]) 08/10/2014: Vitamin D deficiency   Reproductive/Obstetrics                             Anesthesia Physical Anesthesia Plan  ASA: IV  Anesthesia Plan: General   Post-op Pain Management:    Induction: Intravenous  Airway Management Planned: Natural Airway  Additional Equipment:   Intra-op Plan:   Post-operative Plan:   Informed Consent: I have reviewed the patients History and Physical, chart, labs and discussed the procedure including the risks, benefits and alternatives for the proposed anesthesia with the patient or authorized representative who has  indicated his/her understanding and acceptance.   Dental advisory given  Plan Discussed with: CRNA and Anesthesiologist  Anesthesia Plan Comments:         Lab Results  Component Value Date   WBC 7.9 10/23/2016   HGB 8.5 (L) 10/23/2016   HCT 26.1 (L) 10/23/2016   MCV 89.6 10/23/2016   PLT 219 10/23/2016    Anesthesia Quick Evaluation

## 2016-10-24 NOTE — Progress Notes (Signed)
Push enteroscopy:  No evidence of bleed/AVM's or ulcers  1. Large hiatal hernia 2. Duodenal polyp non bleeding    Plan : 1. Advance diet  2. If has no overt bleeding and Hb stable can go home from GI point of view 3. OP close follow up to monitor CBC 4. F/u with [facility] GI Dr Tiffany Kocher or Dr Gustavo Lah- discuss if duodenal polyp needs any further evaluation,small bowel enteroscopy at [facility] and consideration of empiric sandostatin injections for AVM's,  Periodic iv iron , \ 5. No NSAID's   I will sign off.  Please call me if any further GI concerns or questions.  We would like to thank you for the opportunity to participate in the care of [name].    Dr Jonathon Bellows  Gastroenterology/Hepatology Pager: [phone]

## 2016-10-24 NOTE — Progress Notes (Signed)
PT Cancellation Note  Patient Details Name: [name] MRN: [id number] DOB: [birth year]   Cancelled Treatment:    Reason Eval/Treat Not Completed: Patient declined, no reason specified. Chart reviewed. Attempted to see patient. Pt continues to refuse to work with physical therapy for now the fourth day in a row. Today pt reports that he just had his procedure, he is too weak, and he doesn't feel like he can participate with therapist. Pt provides a variety of excuses however does state that he has been up to use the restroom as well as sit on EOB since his procedure. Pt has refused to work with PT for four days in a row. Current order will be completed due to refusal by patient to participate. Please enter new order if PT evaluation is required and pt is agreeable to participate.   Lyndel Safe Huprich PT, DPT   Huprich,Jason 10/24/2016, 1:44 PM

## 2016-10-24 NOTE — Progress Notes (Signed)
[place] [facility]                                                                                                                                                                                  Patient Demographics   [name], is a 76 y.o. 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    Review of Systems:   CONSTITUTIONAL: No documented fever. No fatigue, weakness. No weight gain, no weight loss.  EYES: No blurry or double vision.  ENT: No tinnitus. No postnasal drip. No redness of the oropharynx.  RESPIRATORY: No cough, no wheeze, no hemoptysis. Chronic shortness of breath  CARDIOVASCULAR: No chest pain. No orthopnea. No palpitations. No syncope.  GASTROINTESTINAL: No nausea, no vomiting or diarrhea. No abdominal pain. No melena or hematochezia. Complains of left upper quadrant abdominal pain. GENITOURINARY: No dysuria or hematuria.  ENDOCRINE: No polyuria or nocturia. No heat or cold intolerance.  HEMATOLOGY: No anemia. No bruising. No bleeding.  INTEGUMENTARY: No rashes. No lesions.  MUSCULOSKELETAL: No arthritis. No swelling. No gout.  NEUROLOGIC: No numbness, tingling, or ataxia. No seizure-type activity.  PSYCHIATRIC: No anxiety. No insomnia. No ADD.    Vitals:   Vitals:   10/24/16 1050 10/24/16 1100 10/24/16 1110 10/24/16 1146  BP: 108/90 104/61 111/63 112/66  Pulse: 81 82 81 78  Resp: 18 17 20 18   Temp:    97.8 F (36.6 C)  TempSrc:      SpO2: 99% 98% 100% 98%  Weight:      Height:        Wt Readings from Last 3 Encounters:  10/24/16 117 kg (258 lb)  10/09/16 122.5 kg (270 lb)  08/02/16 121.1 kg  (267 lb)     Intake/Output Summary (Last 24 hours) at 10/24/16 1637 Last data filed at 10/24/16 1345  Gross per 24 hour  Intake          1474.75 ml  Output             2620 ml  Net         -1145.25 ml    Physical Exam:   GENERAL: Pleasant-appearing in no apparent distress.  HEAD, EYES, EARS, NOSE AND THROAT: Atraumatic, normocephalic. Extraocular muscles are intact. Pupils equal and reactive to light. Sclerae anicteric. No conjunctival injection. No  oro-pharyngeal erythema.  NECK: Supple. There is no jugular venous distention. No bruits, no lymphadenopathy, no thyromegaly.  HEART: Regular rate and rhythm,. No murmurs, no rubs, no clicks.  LUNGSDecreased breath sounds bilaterally . No rales or rhonchi. No wheezes.  ABDOMEN: Soft, flat, nontender, nondistended. Has good bowel sounds. No hepatosplenomegaly appreciated.  EXTREMITIES: No evidence of any cyanosis, clubbing, or peripheral edema.  +2 pedal and radial pulses bilaterally.  NEUROLOGIC: The patient is alert, awake, and oriented x3 with no focal motor or sensory deficits appreciated bilaterally.  SKIN: Moist and warm with no rashes appreciated.  Psych: Not anxious, depressed LN: No inguinal LN enlargement    Antibiotics   Anti-infectives    None      Medications   Scheduled Meds: . atorvastatin  40 mg Oral QHS  . cholecalciferol  1,000 Units Oral Daily  . citalopram  40 mg Oral Daily  . ferrous sulfate  325 mg Oral TID WC  . gabapentin  300 mg Oral TID  . gi cocktail  30 mL Oral Once  . insulin aspart  0-20 Units Subcutaneous Q4H  . insulin glargine  20 Units Subcutaneous BID  . ketorolac  15 mg Intravenous Once  . lisinopril  2.5 mg Oral Daily  . mouth rinse  15 mL Mouth Rinse BID  . metoprolol tartrate  25 mg Oral BID  . mometasone-formoterol  2 puff Inhalation BID  . pantoprazole  40 mg Intravenous Q12H  . sodium chloride flush  3 mL Intravenous Q12H  . sucralfate  1 g Oral TID WC & HS  . tiotropium  18 mcg  Inhalation Daily  . torsemide  20 mg Oral BID   Continuous Infusions: . sodium chloride 70 mL/hr at 10/24/16 1150   PRN Meds:.acetaminophen, albuterol, ALPRAZolam, carbamide peroxide, ondansetron **OR** ondansetron (ZOFRAN) IV, traMADol-acetaminophen, zolpidem   Data Review:   Micro Results No results found for this or any previous visit (from the past 240 hour(s)).  Radiology Reports Dg Chest Portable 1 View  Result Date: 10/20/2016 CLINICAL DATA:  Shortness of breath, chest pain. EXAM: PORTABLE CHEST 1 VIEW COMPARISON:  Radiographs of October 09, 2016. FINDINGS: Stable cardiomegaly. Status post coronary artery bypass graft. No pneumothorax is noted. Right lung is clear. Stable left basilar opacity is noted concerning for atelectasis or infiltrate with possible associated pleural effusion. Bony thorax is unremarkable. IMPRESSION: Stable left basilar opacity as described above. Electronically Signed   By: Marijo Conception, M.D.   On: 10/20/2016 20:00   Dg Chest Portable 1 View  Result Date: 10/09/2016 CLINICAL DATA:  Weakness, shortness of breath, and GI bleeding for 2 weeks, history COPD, abdominal aortic aneurysm, coronary artery disease, hypertension, type II diabetes mellitus, former smoker EXAM: PORTABLE CHEST 1 VIEW COMPARISON:  Portable exam 0637 hours compared to 07/24/2016 FINDINGS: Upper normal heart size post CABG. Atherosclerotic calcification aorta. Hiatal hernia, noted on prior CT from 2016. Mediastinal contours and pulmonary vascularity otherwise normal. Stable pleural thickening at LEFT apex. Underlying emphysematous changes with chronic atelectasis versus consolidation in LEFT lower lobe. Linear scarring LEFT upper lobe. No definite pleural effusion or pneumothorax. Bones demineralized. IMPRESSION: Changes of COPD with LEFT upper lobe scarring and chronic atelectasis versus consolidation in LEFT lower lobe. Hiatal hernia. Post CABG. Electronically Signed   By: Lavonia Dana M.D.    On: 10/09/2016 07:25     CBC  Recent Labs Lab 10/20/16 2142 10/21/16 0744 10/22/16 0431 10/23/16 0823  WBC 10.4  --  6.7 7.9  HGB 6.7* 8.1* 8.0* 8.5*  HCT 21.3* 24.5* 24.2* 26.1*  PLT 255  --  189 219  MCV 91.9  --  88.3 89.6  MCH 28.9  --  29.3 29.4  MCHC 31.4*  --  33.1 32.8  RDW 17.1*  --  16.9* 16.4*  LYMPHSABS 1.3  --   --   --   MONOABS 0.7  --   --   --   EOSABS 0.1  --   --   --   BASOSABS 0.1  --   --   --     Chemistries   Recent Labs Lab 10/20/16 2142 10/22/16 0431  NA 139 139  K 4.5 4.1  CL 98* 101  CO2 33* 34*  GLUCOSE 249* 238*  BUN 39* 27*  CREATININE 1.49* 1.37*  CALCIUM 8.1* 7.7*  AST 18  --   ALT 12*  --   ALKPHOS 90  --   BILITOT 0.7  --    ------------------------------------------------------------------------------------------------------------------ estimated creatinine clearance is 64.3 mL/min (by C-G formula based on SCr of 1.37 mg/dL (H)). ------------------------------------------------------------------------------------------------------------------ No results for input(s): HGBA1C in the last 72 hours. ------------------------------------------------------------------------------------------------------------------ No results for input(s): CHOL, HDL, LDLCALC, TRIG, CHOLHDL, LDLDIRECT in the last 72 hours. ------------------------------------------------------------------------------------------------------------------ No results for input(s): TSH, T4TOTAL, T3FREE, THYROIDAB in the last 72 hours.  Invalid input(s): FREET3 ------------------------------------------------------------------------------------------------------------------ No results for input(s): VITAMINB12, FOLATE, FERRITIN, TIBC, IRON, RETICCTPCT in the last 72 hours.  Coagulation profile No results for input(s): INR, PROTIME in the last 168 hours.  No results for input(s): DDIMER in the last 72 hours.  Cardiac Enzymes  Recent Labs Lab 10/20/16 2142   TROPONINI 0.03*   ------------------------------------------------------------------------------------------------------------------ Invalid input(s): POCBNP    Assessment & Plan   This is a 75 y.o. 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 Symptomatic anemia secondary to upper GI bleed, h/o AVM, iron deficiency,hb  Improved from 6.7 to  8.5. No indication for further transfusion. S/p EGD showing large hiatal herniia,which can cause iron malabsorption.polyps in stomach are sessile with no evidence of bleeding, . Patient transfused  2 units transfusions.hemoglobin is currently stable .plan is to check hemoglobin in am if around 8 discharge him home.same d/w patient.  2. AKI on CKD, mild currently stable.  3. History of home O2 dependent COPD-continue O2,on 5 litres at  Home., nebulizers, Symbicort, Spiriva.  has no wheezing today.  4. History of hyperlipidemia-continue Lipitor  5. History of diabetes-continue Lantus and regular insulin sliding scale coverage every 4 hours.  metformin being held  6. History of hypertension, CHF-continue lisinopril, torsemide, metoprolol  7. History of depression-continue Celexa  8. History of chronic pain-continue Ultracet, Neurontin,  Likely d/c am.d/w patient,     Code Status Orders        Start     Ordered   10/21/16 0415  Full code  Continuous     10/21/16 0414    Code Status History    Date Active Date Inactive Code Status Order ID Comments User Context   10/09/2016 11:52 AM 10/12/2016  4:49 PM Full Code [id number]  Lytle Butte, MD ED   10/09/2016 11:05 AM 10/09/2016 11:52 AM DNR [id number]  Lytle Butte, MD ED   07/24/2016  8:06 AM 07/27/2016  5:59 PM DNR [id number]  Harrie Foreman, MD Inpatient   02/10/2016  3:28  PM 02/12/2016  8:09 PM DNR [id number]  Loletha Grayer, MD ED   10/26/2015   3:14 AM 11/01/2015  8:05 PM Full Code [id number]  Saundra Shelling, MD Inpatient   08/20/2015  4:41 AM 08/22/2015  4:49 PM Full Code [id number]  Lytle Butte, MD ED   08/20/2015  2:54 AM 08/20/2015  4:41 AM Full Code [id number]  Lytle Butte, MD ED   07/27/2015  8:40 PM 07/29/2015  6:29 PM Full Code [id number]  Lytle Butte, MD ED   06/30/2015 12:40 PM 07/03/2015  3:26 PM Full Code [id number]  Bettey Costa, MD Inpatient   05/19/2015  2:59 PM 05/23/2015  4:21 PM Full Code [id number]  Max Sane, MD Inpatient   05/06/2015  5:14 PM 05/13/2015  5:06 PM Full Code [id number]  Epifanio Lesches, MD ED   05/03/2015 12:29 PM 05/04/2015  7:20 PM Full Code [id number]  Idelle Crouch, MD Inpatient   04/22/2015  4:50 AM 04/27/2015  7:35 PM Full Code [id number]  Harrie Foreman, MD Inpatient   04/17/2015  7:16 PM 04/19/2015  5:16 PM Full Code [id number]  Clovis Fredrickson, MD Inpatient   04/10/2015  9:12 AM 04/17/2015  7:08 PM Full Code [id number]  Demetrios Loll, MD Inpatient   03/09/2015 12:18 PM 03/12/2015  6:45 PM Full Code [id number]  Henreitta Leber, MD Inpatient   08/24/2014  9:41 PM 08/25/2014  5:20 PM Full Code [id number]  Ivor Costa, MD ED   04/02/2014  8:11 PM 04/04/2014  5:10 PM Full Code [id number]  Velvet Bathe, MD Inpatient   03/18/2014  3:44 PM 03/22/2014  2:06 PM Full Code [id number]  Verlee Monte, MD Inpatient   12/04/2013  6:13 AM 12/10/2013  7:54 PM Full Code [id number]  Rise Patience, MD Inpatient   10/15/2013  8:11 PM 10/18/2013  6:40 PM Full Code [id number]  Fuller Plan, MD Inpatient   09/08/2013 12:43 AM 09/10/2013  3:30 PM Full Code [id number]  Toy Baker, MD Inpatient   08/19/2013  5:48 PM 08/22/2013  3:51 PM Full Code [id number]  Orson Eva, MD ED   05/15/2013  1:26 AM 05/17/2013  4:59 PM Full Code [id number]  Theodis Blaze, MD Inpatient   12/04/2012  5:23 PM 12/08/2012  4:56 PM Full Code [id number]  Janece Canterbury, MD Inpatient   05/17/2012 12:56 PM 05/21/2012  6:09 PM Full Code [id number]  Campbell Lerner, RN ED   04/06/2012  7:35 PM  04/14/2012  5:23 PM Full Code [id number]  Marylou Mccoy, RN Inpatient   03/27/2012  4:44 AM 03/28/2012  5:56 PM Full Code [id number]  Theotis Barrio, RN Inpatient           Consults  gi   DVT Prophylaxis  scd's  Lab Results  Component Value Date   PLT 219 10/23/2016     Time Spent in minutes   3min  Greater than 50% of time spent in care coordination and counseling patient regarding the condition and plan of care.   Epifanio Lesches M.D on 10/24/2016 at 4:37 PM  Between 7am to 6pm - Pager - [phone]  After 6pm go to [email or web address] - password [email or web address] [facility]   Office  [phone]

## 2016-10-24 NOTE — Progress Notes (Signed)
In to give patient GI cocktail ordered by on call MD. Patient up to have BM, states that his abd pain has improved and no longer feels that he needs anything for his abd. Pt states that abd pain has resolved, GI cocktail not given, pt refused.

## 2016-10-24 NOTE — Plan of Care (Signed)
Problem: Physical Regulation: Goal: Ability to maintain clinical measurements within normal limits will improve Outcome: Progressing No signs of bleeding. Some upper abdominal tenderness. Denies pain/n/v.  Problem: Fluid Volume: Goal: Ability to maintain a balanced intake and output will improve Outcome: Progressing S/P EGD. Tolerates cardiac diet.   Problem: Nutrition: Goal: Adequate nutrition will be maintained Outcome: Progressing As above

## 2016-10-24 NOTE — Care Management Important Message (Signed)
Important Message  Patient Details  Name: FREAD ACKROYD MRN: [id number] Date of Birth: 05/19/1941   Medicare Important Message Given:  Yes    Shelbie Ammons, RN 10/24/2016, 9:02 AM

## 2016-10-24 NOTE — Op Note (Signed)
[facility]astroenterology Patient Name: [name] Procedure Date: 10/24/2016 10:30 AM MRN: [id number] Account #: [id number] Date of Birth: [birth year] Admit Type: Inpatient Age: 76 Room: [facility] ENDO ROOM 4 Gender: Male Note Status: Finalized Procedure:            Upper GI endoscopy Indications:          Iron deficiency anemia secondary to chronic blood loss Providers:            Jonathon Bellows MD, MD Referring MD:         [name] (Referring MD) Medicines:            Monitored Anesthesia Care Complications:        No immediate complications. Procedure:            Pre-Anesthesia Assessment:                       - Prior to the procedure, a History and Physical was                        performed, and patient medications, allergies and                        sensitivities were reviewed. The patient's tolerance of                        previous anesthesia was reviewed.                       - The risks and benefits of the procedure and the                        sedation options and risks were discussed with the                        patient. All questions were answered and informed                        consent was obtained.                       - The risks and benefits of the procedure and the                        sedation options and risks were discussed with the                        patient. All questions were answered and informed                        consent was obtained.                       - ASA Grade Assessment: IV - A patient with severe                        systemic disease that is a constant threat to life.                       After obtaining informed consent, the endoscope was  passed under direct vision. Throughout the procedure,                        the patient's blood pressure, pulse, and oxygen                        saturations were monitored continuously. The                        Colonoscope was  introduced through the mouth, and                        advanced to the jejunum. The upper GI endoscopy was                        accomplished with ease. The patient tolerated the                        procedure well. Findings:      The esophagus was normal.      A large15 cm hiatal hernia was present.      Multiple 5 to 8 mm semi-sessile polyps with no bleeding and no stigmata       of recent bleeding were found in the stomach.      A single 15 mm semi-sessile polyp with no bleeding was found in the       third portion of the duodenum. no attempt at polypectomy was made due to       size and location      The examined jejunum was normal. Impression:           - Normal esophagus.                       - Large15 cm hiatal hernia.                       - Multiple gastric polyps.                       - A single duodenal polyp.                       - Normal examined jejunum.                       - No specimens collected. Recommendation:       - Return patient to hospital ward for ongoing care.                       - Advance diet as tolerated today. Procedure Code(s):    --- Professional ---                       [phone], Esophagogastroduodenoscopy, flexible, transoral;                        diagnostic, including collection of specimen(s) by                        brushing or washing, when performed (separate procedure) Diagnosis Code(s):    --- Professional ---  K44.9, Diaphragmatic hernia without obstruction or                        gangrene                       K31.7, Polyp of stomach and duodenum                       D50.0, Iron deficiency anemia secondary to blood loss                        (chronic) CPT copyright 2016 American Medical Association. All rights reserved. The codes documented in this report are preliminary and upon coder review may  be revised to meet current compliance requirements. Jonathon Bellows, MD Jonathon Bellows MD, MD 10/24/2016 10:49:12  AM This report has been signed electronically. Number of Addenda: 0 Note Initiated On: 10/24/2016 10:30 AM      [facility]

## 2016-10-24 NOTE — Anesthesia Postprocedure Evaluation (Signed)
Anesthesia Post Note  Patient: [name]  Procedure(s) Performed: Procedure(s) (LRB): ESOPHAGOGASTRODUODENOSCOPY (EGD) WITH PROPOFOL (N/A)  Patient location during evaluation: Endoscopy Anesthesia Type: General Level of consciousness: awake and alert and oriented Pain management: pain level controlled Vital Signs Assessment: post-procedure vital signs reviewed and stable Respiratory status: spontaneous breathing, nonlabored ventilation and respiratory function stable Cardiovascular status: blood pressure returned to baseline and stable Postop Assessment: no signs of nausea or vomiting Anesthetic complications: no     Last Vitals:  Vitals:   10/24/16 1100 10/24/16 1110  BP: 104/61 111/63  Pulse: 82 81  Resp: 17 20  Temp:      Last Pain:  Vitals:   10/24/16 1012  TempSrc: Tympanic  PainSc:                  [name]

## 2016-10-24 NOTE — Progress Notes (Signed)
Inpatient Diabetes Program Recommendations  AACE/ADA: New Consensus Statement on Inpatient Glycemic Control (2015)  Target Ranges:  Prepandial:   less than 140 mg/dL      Peak postprandial:   less than 180 mg/dL (1-2 hours)      Critically ill patients:  140 - 180 mg/dL  Results for [name], [name] (MRN [id number]) as of 10/24/2016 09:29  Ref. Range 10/23/2016 07:39 10/23/2016 12:35 10/23/2016 17:13 10/23/2016 19:52 10/24/2016 00:34 10/24/2016 04:02 10/24/2016 07:34  Glucose-Capillary Latest Ref Range: 65 - 99 mg/dL 242 (H) 130 (H) 192 (H) 225 (H) 235 (H) 143 (H) 139 (H)    Review of Glycemic Control  Diabetes history: DM2 Outpatient Diabetes medications: Lantus 20 units BID, Novolog 10 units TID with meals, Metformin 500 mg BID Current orders for Inpatient glycemic control: Lantus 20 units BID, Novolog 0-20 units Q4H  Inpatient Diabetes Program Recommendations: Insulin - Basal: Please consider increasing Lantus to 23 units BID. Insulin - Meal Coverage: Once diet is resumed, please consider ordering Novolog 3 units TID with meals for meal coverage if patient eats at least 50% of meal.  Thanks, Barnie Alderman, RN, MSN, CDE Diabetes Coordinator Inpatient Diabetes Program [phone] (Team Pager from 8am to 5pm)

## 2016-10-24 NOTE — Progress Notes (Signed)
In to give pt IV pain medication that pt requested (d/t NPO for EGD today) Pt req that the on call MD be paged  and pt is now refusing this  Medication (Toradol IV 15mg  once). Pt states that he will "just live with the pain". Pt is cursing and refusing to cooperate. Clarified with the patient that pain medication has been ordered and that he is refusing this medication that he did request that I call the on call MD to try and obtain. Pt refused to speak anymore at this point. Instructed pt to call if he needed assistance or decided that he would take the IV pain medication.

## 2016-10-24 NOTE — Care Management (Signed)
NPO Scheduled for EGD today. Refused to work with physical therapy yesterday.  Shelbie Ammons RN MSN CCM Care Management
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
## 2016-10-25 ENCOUNTER — Encounter: Payer: Self-pay | Admitting: Gastroenterology

## 2016-10-25 LAB — GLUCOSE, CAPILLARY
GLUCOSE-CAPILLARY: 198 mg/dL — AB (ref 65–99)
GLUCOSE-CAPILLARY: 224 mg/dL — AB (ref 65–99)

## 2016-10-25 LAB — CBC
HCT: 28.8 % — ABNORMAL LOW (ref 40.0–52.0)
Hemoglobin: 9.3 g/dL — ABNORMAL LOW (ref 13.0–18.0)
MCH: 29.6 pg (ref 26.0–34.0)
MCHC: 32.2 g/dL (ref 32.0–36.0)
MCV: 91.7 fL (ref 80.0–100.0)
Platelets: 255 10*3/uL (ref 150–440)
RBC: 3.14 MIL/uL — ABNORMAL LOW (ref 4.40–5.90)
RDW: 18.2 % — AB (ref 11.5–14.5)
WBC: 7.3 10*3/uL (ref 3.8–10.6)
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
## 2016-11-07 ENCOUNTER — Encounter: Payer: Self-pay | Admitting: Emergency Medicine

## 2016-11-07 ENCOUNTER — Inpatient Hospital Stay: Payer: Medicare HMO | Admitting: Oncology

## 2016-11-07 ENCOUNTER — Observation Stay
Admission: EM | Admit: 2016-11-07 | Discharge: 2016-11-09 | Disposition: A | Discharge status: Home / Self Care | Payer: Medicare HMO | Attending: Internal Medicine | Admitting: Internal Medicine

## 2016-11-07 ENCOUNTER — Emergency Department: Payer: Medicare HMO

## 2016-11-07 DIAGNOSIS — G894 Chronic pain syndrome: Secondary | ICD-10-CM | POA: Insufficient documentation

## 2016-11-07 DIAGNOSIS — K31819 Angiodysplasia of stomach and duodenum without bleeding: Secondary | ICD-10-CM | POA: Diagnosis not present

## 2016-11-07 DIAGNOSIS — I2510 Atherosclerotic heart disease of native coronary artery without angina pectoris: Secondary | ICD-10-CM | POA: Diagnosis not present

## 2016-11-07 DIAGNOSIS — K219 Gastro-esophageal reflux disease without esophagitis: Secondary | ICD-10-CM | POA: Insufficient documentation

## 2016-11-07 DIAGNOSIS — G4700 Insomnia, unspecified: Secondary | ICD-10-CM | POA: Diagnosis not present

## 2016-11-07 DIAGNOSIS — I110 Hypertensive heart disease with heart failure: Secondary | ICD-10-CM | POA: Diagnosis not present

## 2016-11-07 DIAGNOSIS — Z87891 Personal history of nicotine dependence: Secondary | ICD-10-CM | POA: Insufficient documentation

## 2016-11-07 DIAGNOSIS — K922 Gastrointestinal hemorrhage, unspecified: Secondary | ICD-10-CM

## 2016-11-07 DIAGNOSIS — I471 Supraventricular tachycardia: Secondary | ICD-10-CM | POA: Insufficient documentation

## 2016-11-07 DIAGNOSIS — K317 Polyp of stomach and duodenum: Secondary | ICD-10-CM | POA: Diagnosis not present

## 2016-11-07 DIAGNOSIS — K5710 Diverticulosis of small intestine without perforation or abscess without bleeding: Secondary | ICD-10-CM | POA: Diagnosis not present

## 2016-11-07 DIAGNOSIS — F332 Major depressive disorder, recurrent severe without psychotic features: Secondary | ICD-10-CM | POA: Insufficient documentation

## 2016-11-07 DIAGNOSIS — F3160 Bipolar disorder, current episode mixed, unspecified: Secondary | ICD-10-CM | POA: Insufficient documentation

## 2016-11-07 DIAGNOSIS — Z9981 Dependence on supplemental oxygen: Secondary | ICD-10-CM | POA: Insufficient documentation

## 2016-11-07 DIAGNOSIS — R0602 Shortness of breath: Secondary | ICD-10-CM

## 2016-11-07 DIAGNOSIS — Z888 Allergy status to other drugs, medicaments and biological substances status: Secondary | ICD-10-CM | POA: Insufficient documentation

## 2016-11-07 DIAGNOSIS — K449 Diaphragmatic hernia without obstruction or gangrene: Secondary | ICD-10-CM | POA: Insufficient documentation

## 2016-11-07 DIAGNOSIS — R413 Other amnesia: Secondary | ICD-10-CM | POA: Diagnosis not present

## 2016-11-07 DIAGNOSIS — E559 Vitamin D deficiency, unspecified: Secondary | ICD-10-CM | POA: Insufficient documentation

## 2016-11-07 DIAGNOSIS — J449 Chronic obstructive pulmonary disease, unspecified: Secondary | ICD-10-CM | POA: Diagnosis not present

## 2016-11-07 DIAGNOSIS — Z8249 Family history of ischemic heart disease and other diseases of the circulatory system: Secondary | ICD-10-CM | POA: Insufficient documentation

## 2016-11-07 DIAGNOSIS — E1140 Type 2 diabetes mellitus with diabetic neuropathy, unspecified: Secondary | ICD-10-CM | POA: Diagnosis not present

## 2016-11-07 DIAGNOSIS — I739 Peripheral vascular disease, unspecified: Secondary | ICD-10-CM | POA: Insufficient documentation

## 2016-11-07 DIAGNOSIS — I252 Old myocardial infarction: Secondary | ICD-10-CM | POA: Insufficient documentation

## 2016-11-07 DIAGNOSIS — Z8679 Personal history of other diseases of the circulatory system: Secondary | ICD-10-CM | POA: Diagnosis not present

## 2016-11-07 DIAGNOSIS — R531 Weakness: Secondary | ICD-10-CM | POA: Diagnosis present

## 2016-11-07 DIAGNOSIS — I5032 Chronic diastolic (congestive) heart failure: Secondary | ICD-10-CM | POA: Insufficient documentation

## 2016-11-07 DIAGNOSIS — Z825 Family history of asthma and other chronic lower respiratory diseases: Secondary | ICD-10-CM | POA: Insufficient documentation

## 2016-11-07 DIAGNOSIS — F411 Generalized anxiety disorder: Secondary | ICD-10-CM | POA: Diagnosis not present

## 2016-11-07 DIAGNOSIS — Z833 Family history of diabetes mellitus: Secondary | ICD-10-CM | POA: Insufficient documentation

## 2016-11-07 DIAGNOSIS — I4510 Unspecified right bundle-branch block: Secondary | ICD-10-CM | POA: Insufficient documentation

## 2016-11-07 DIAGNOSIS — Z951 Presence of aortocoronary bypass graft: Secondary | ICD-10-CM | POA: Insufficient documentation

## 2016-11-07 DIAGNOSIS — Z880 Allergy status to penicillin: Secondary | ICD-10-CM | POA: Insufficient documentation

## 2016-11-07 DIAGNOSIS — D649 Anemia, unspecified: Secondary | ICD-10-CM

## 2016-11-07 DIAGNOSIS — K31811 Angiodysplasia of stomach and duodenum with bleeding: Secondary | ICD-10-CM | POA: Diagnosis not present

## 2016-11-07 DIAGNOSIS — Z794 Long term (current) use of insulin: Secondary | ICD-10-CM | POA: Insufficient documentation

## 2016-11-07 DIAGNOSIS — J189 Pneumonia, unspecified organism: Secondary | ICD-10-CM | POA: Insufficient documentation

## 2016-11-07 DIAGNOSIS — D500 Iron deficiency anemia secondary to blood loss (chronic): Principal | ICD-10-CM | POA: Insufficient documentation

## 2016-11-07 DIAGNOSIS — Z885 Allergy status to narcotic agent status: Secondary | ICD-10-CM | POA: Insufficient documentation

## 2016-11-07 DIAGNOSIS — Z79899 Other long term (current) drug therapy: Secondary | ICD-10-CM | POA: Insufficient documentation

## 2016-11-07 DIAGNOSIS — H6123 Impacted cerumen, bilateral: Secondary | ICD-10-CM | POA: Insufficient documentation

## 2016-11-07 DIAGNOSIS — Z820 Family history of epilepsy and other diseases of the nervous system: Secondary | ICD-10-CM | POA: Insufficient documentation

## 2016-11-07 DIAGNOSIS — Z6833 Body mass index (BMI) 33.0-33.9, adult: Secondary | ICD-10-CM | POA: Insufficient documentation

## 2016-11-07 DIAGNOSIS — E785 Hyperlipidemia, unspecified: Secondary | ICD-10-CM | POA: Insufficient documentation

## 2016-11-07 LAB — BASIC METABOLIC PANEL
Anion gap: 8 (ref 5–15)
BUN: 25 mg/dL — ABNORMAL HIGH (ref 6–20)
CALCIUM: 8.2 mg/dL — AB (ref 8.9–10.3)
CO2: 26 mmol/L (ref 22–32)
CREATININE: 1.15 mg/dL (ref 0.61–1.24)
Chloride: 103 mmol/L (ref 101–111)
GFR calc Af Amer: >60 mL/min (ref >60)
GLUCOSE: 194 mg/dL — AB (ref 65–99)
Potassium: 4.3 mmol/L (ref 3.5–5.1)
Sodium: 137 mmol/L (ref 135–145)

## 2016-11-07 LAB — CBC WITH DIFFERENTIAL/PLATELET
BAND NEUTROPHILS: 4 %
Basophils Absolute: 0 10*3/uL (ref 0–0.1)
Basophils Absolute: 0 10*3/uL (ref 0–0.1)
Basophils Relative: 0 %
Basophils Relative: 1 %
Blasts: 0 %
EOS ABS: 0 10*3/uL (ref 0–0.7)
Eosinophils Absolute: 0.1 10*3/uL (ref 0–0.7)
Eosinophils Relative: 0 %
Eosinophils Relative: 1 %
HEMATOCRIT: 21.9 % — AB (ref 40.0–52.0)
HEMATOCRIT: 22.5 % — AB (ref 40.0–52.0)
HEMOGLOBIN: 7.5 g/dL — AB (ref 13.0–18.0)
Hemoglobin: 7.4 g/dL — ABNORMAL LOW (ref 13.0–18.0)
LYMPHS ABS: 0.9 10*3/uL — AB (ref 1.0–3.6)
LYMPHS PCT: 14 %
Lymphocytes Relative: 20 %
Lymphs Abs: 2.1 10*3/uL (ref 1.0–3.6)
MCH: 29.5 pg (ref 26.0–34.0)
MCH: 29.9 pg (ref 26.0–34.0)
MCHC: 33.7 g/dL (ref 32.0–36.0)
MCHC: 33.3 g/dL (ref 32.0–36.0)
MCV: 88.9 fL (ref 80.0–100.0)
MCV: 88.6 fL (ref 80.0–100.0)
METAMYELOCYTES PCT: 2 %
MONO ABS: 0 10*3/uL — AB (ref 0.2–1.0)
MONOS PCT: 0 %
Monocytes Absolute: 0.6 10*3/uL (ref 0.2–1.0)
Monocytes Relative: 9 %
Myelocytes: 0 %
NEUTROS PCT: 75 %
NRBC: 0 /100{WBCs}
Neutro Abs: 8.4 10*3/uL — ABNORMAL HIGH (ref 1.4–6.5)
Neutro Abs: 5.2 10*3/uL (ref 1.4–6.5)
Neutrophils Relative %: 74 %
Other: 0 %
Platelets: 260 10*3/uL (ref 150–440)
Platelets: 242 10*3/uL (ref 150–440)
Promyelocytes Absolute: 0 %
RBC: 2.46 MIL/uL — ABNORMAL LOW (ref 4.40–5.90)
RBC: 2.53 MIL/uL — AB (ref 4.40–5.90)
RDW: 17 % — ABNORMAL HIGH (ref 11.5–14.5)
RDW: 16.4 % — ABNORMAL HIGH (ref 11.5–14.5)
WBC: 10.5 10*3/uL (ref 3.8–10.6)
WBC: 6.8 10*3/uL (ref 3.8–10.6)

## 2016-11-07 LAB — COMPREHENSIVE METABOLIC PANEL
ALBUMIN: 3.5 g/dL (ref 3.5–5.0)
ALK PHOS: 106 U/L (ref 38–126)
ALT: 13 U/L — AB (ref 17–63)
AST: 23 U/L (ref 15–41)
Anion gap: 10 (ref 5–15)
BILIRUBIN TOTAL: 0.3 mg/dL (ref 0.3–1.2)
BUN: 27 mg/dL — AB (ref 6–20)
CO2: 25 mmol/L (ref 22–32)
CREATININE: 1.25 mg/dL — AB (ref 0.61–1.24)
Calcium: 8.8 mg/dL — ABNORMAL LOW (ref 8.9–10.3)
Chloride: 103 mmol/L (ref 101–111)
GFR calc Af Amer: >60 mL/min (ref >60)
GFR calc non Af Amer: 55 mL/min — ABNORMAL LOW (ref >60)
GLUCOSE: 274 mg/dL — AB (ref 65–99)
Potassium: 4.7 mmol/L (ref 3.5–5.1)
SODIUM: 138 mmol/L (ref 135–145)
Total Protein: 6.7 g/dL (ref 6.5–8.1)

## 2016-11-07 LAB — TROPONIN I: Troponin I: <0.03 ng/mL (ref <0.03)

## 2016-11-07 LAB — PROTIME-INR
INR: 0.92
Prothrombin Time: 12.3 seconds (ref 11.4–15.2)

## 2016-11-07 LAB — GLUCOSE, CAPILLARY
Glucose-Capillary: 220 mg/dL — ABNORMAL HIGH (ref 65–99)
Glucose-Capillary: 272 mg/dL — ABNORMAL HIGH (ref 65–99)
Glucose-Capillary: 231 mg/dL — ABNORMAL HIGH (ref 65–99)

## 2016-11-07 LAB — PREPARE RBC (CROSSMATCH)

## 2016-11-07 MED ORDER — ACETAMINOPHEN 325 MG PO TABS
650.0000 mg | ORAL_TABLET | Freq: Four times a day (QID) | ORAL | Status: DC | PRN
  Filled 2016-11-07: qty 2

## 2016-11-07 MED ORDER — PANTOPRAZOLE SODIUM 40 MG PO TBEC
40.0000 mg | DELAYED_RELEASE_TABLET | Freq: Every day | ORAL | Status: DC
  Administered 2016-11-07: 40 mg via ORAL
  Filled 2016-11-07 (×2): qty 1

## 2016-11-07 MED ORDER — INSULIN ASPART 100 UNIT/ML ~~LOC~~ SOLN: 10.0000 [IU] | Freq: Three times a day (TID) | SUBCUTANEOUS | Status: DC

## 2016-11-07 MED ORDER — SODIUM CHLORIDE 0.9 % IV SOLN
400.0000 mg | Freq: Once | INTRAVENOUS | Status: AC
  Administered 2016-11-07: 400 mg via INTRAVENOUS
  Filled 2016-11-07: qty 20

## 2016-11-07 MED ORDER — TRAMADOL-ACETAMINOPHEN 37.5-325 MG PO TABS
1.0000 | ORAL_TABLET | Freq: Two times a day (BID) | ORAL | Status: DC | PRN
  Administered 2016-11-08: 1 via ORAL
  Filled 2016-11-07 (×3): qty 1

## 2016-11-07 MED ORDER — CITALOPRAM HYDROBROMIDE 20 MG PO TABS
40.0000 mg | ORAL_TABLET | Freq: Every day | ORAL | Status: DC
  Administered 2016-11-07 – 2016-11-09 (×2): 40 mg via ORAL
  Filled 2016-11-07 (×2): qty 2

## 2016-11-07 MED ORDER — FENTANYL CITRATE (PF) 100 MCG/2ML IJ SOLN
INTRAMUSCULAR | Status: AC
  Administered 2016-11-07: 50 ug via INTRAVENOUS
  Filled 2016-11-07: qty 2

## 2016-11-07 MED ORDER — ONDANSETRON HCL 4 MG/2ML IJ SOLN: 4.0000 mg | Freq: Four times a day (QID) | INTRAMUSCULAR | Status: DC | PRN

## 2016-11-07 MED ORDER — FENTANYL CITRATE (PF) 100 MCG/2ML IJ SOLN
50.0000 ug | Freq: Once | INTRAMUSCULAR | Status: AC
  Administered 2016-11-07: 50 ug via INTRAVENOUS

## 2016-11-07 MED ORDER — FERROUS SULFATE 325 (65 FE) MG PO TABS
325.0000 mg | ORAL_TABLET | Freq: Three times a day (TID) | ORAL | Status: DC
  Administered 2016-11-07 – 2016-11-09 (×5): 325 mg via ORAL
  Filled 2016-11-07 (×5): qty 1

## 2016-11-07 MED ORDER — PANTOPRAZOLE SODIUM 40 MG IV SOLR
40.0000 mg | Freq: Two times a day (BID) | INTRAVENOUS | Status: DC
  Administered 2016-11-07: 40 mg via INTRAVENOUS

## 2016-11-07 MED ORDER — SODIUM CHLORIDE 0.9% FLUSH: 3.0000 mL | INTRAVENOUS | Status: DC | PRN

## 2016-11-07 MED ORDER — VITAMIN D 1000 UNITS PO TABS
1000.0000 [IU] | ORAL_TABLET | Freq: Every day | ORAL | Status: DC
  Administered 2016-11-07 – 2016-11-09 (×2): 1000 [IU] via ORAL
  Filled 2016-11-07 (×2): qty 1

## 2016-11-07 MED ORDER — SODIUM CHLORIDE 0.9 % IV BOLUS (SEPSIS)
1000.0000 mL | Freq: Once | INTRAVENOUS | Status: AC
  Administered 2016-11-07: 1000 mL via INTRAVENOUS

## 2016-11-07 MED ORDER — INSULIN GLARGINE 100 UNIT/ML ~~LOC~~ SOLN
20.0000 [IU] | Freq: Two times a day (BID) | SUBCUTANEOUS | Status: DC
  Administered 2016-11-07 – 2016-11-09 (×4): 20 [IU] via SUBCUTANEOUS
  Filled 2016-11-07 (×6): qty 0.2

## 2016-11-07 MED ORDER — ZOLPIDEM TARTRATE 5 MG PO TABS
5.0000 mg | ORAL_TABLET | Freq: Every evening | ORAL | Status: DC | PRN
  Administered 2016-11-08 (×2): 5 mg via ORAL
  Filled 2016-11-07 (×3): qty 1

## 2016-11-07 MED ORDER — CHLORHEXIDINE GLUCONATE 0.12 % MT SOLN
15.0000 mL | Freq: Two times a day (BID) | OROMUCOSAL | Status: DC
  Administered 2016-11-07: 15 mL via OROMUCOSAL
  Filled 2016-11-07 (×2): qty 15

## 2016-11-07 MED ORDER — ORAL CARE MOUTH RINSE
15.0000 mL | Freq: Two times a day (BID) | OROMUCOSAL | Status: DC
  Administered 2016-11-08: 15 mL via OROMUCOSAL

## 2016-11-07 MED ORDER — ALBUTEROL SULFATE HFA 108 (90 BASE) MCG/ACT IN AERS: 2.0000 | INHALATION_SPRAY | Freq: Four times a day (QID) | RESPIRATORY_TRACT | Status: DC | PRN

## 2016-11-07 MED ORDER — SODIUM CHLORIDE 0.9 % IV SOLN: 250.0000 mL | INTRAVENOUS | Status: DC | PRN

## 2016-11-07 MED ORDER — SODIUM CHLORIDE 0.9 % IV SOLN
8.0000 mg/h | INTRAVENOUS | Status: DC
  Administered 2016-11-07 – 2016-11-09 (×4): 8 mg/h via INTRAVENOUS
  Filled 2016-11-07 (×5): qty 80
  Filled 2016-11-07: qty 40

## 2016-11-07 MED ORDER — SUCRALFATE 1 G PO TABS
1.0000 g | ORAL_TABLET | Freq: Three times a day (TID) | ORAL | Status: DC
  Administered 2016-11-07 – 2016-11-09 (×7): 1 g via ORAL
  Filled 2016-11-07 (×7): qty 1

## 2016-11-07 MED ORDER — TORSEMIDE 20 MG PO TABS
20.0000 mg | ORAL_TABLET | Freq: Two times a day (BID) | ORAL | Status: DC
  Administered 2016-11-07 – 2016-11-09 (×3): 20 mg via ORAL
  Filled 2016-11-07 (×3): qty 1

## 2016-11-07 MED ORDER — SODIUM CHLORIDE 0.9 % IV SOLN
10.0000 mL/h | Freq: Once | INTRAVENOUS | Status: AC
  Administered 2016-11-07: 10 mL/h via INTRAVENOUS

## 2016-11-07 MED ORDER — GABAPENTIN 300 MG PO CAPS
300.0000 mg | ORAL_CAPSULE | Freq: Three times a day (TID) | ORAL | Status: DC
  Administered 2016-11-07 – 2016-11-09 (×6): 300 mg via ORAL
  Filled 2016-11-07 (×6): qty 1

## 2016-11-07 MED ORDER — METFORMIN HCL 500 MG PO TABS: 500.0000 mg | ORAL_TABLET | Freq: Two times a day (BID) | ORAL | Status: DC

## 2016-11-07 MED ORDER — METOPROLOL TARTRATE 25 MG PO TABS
25.0000 mg | ORAL_TABLET | Freq: Two times a day (BID) | ORAL | Status: DC
  Administered 2016-11-07 – 2016-11-09 (×3): 25 mg via ORAL
  Filled 2016-11-07 (×4): qty 1

## 2016-11-07 MED ORDER — INSULIN ASPART 100 UNIT/ML ~~LOC~~ SOLN
0.0000 [IU] | Freq: Every day | SUBCUTANEOUS | Status: DC
  Administered 2016-11-07 – 2016-11-08 (×2): 2 [IU] via SUBCUTANEOUS
  Filled 2016-11-07 (×2): qty 2

## 2016-11-07 MED ORDER — TIOTROPIUM BROMIDE MONOHYDRATE 18 MCG IN CAPS
18.0000 ug | ORAL_CAPSULE | Freq: Every morning | RESPIRATORY_TRACT | Status: DC
  Administered 2016-11-07 – 2016-11-09 (×2): 18 ug via RESPIRATORY_TRACT
  Filled 2016-11-07: qty 5

## 2016-11-07 MED ORDER — SODIUM CHLORIDE 0.9% FLUSH
3.0000 mL | Freq: Two times a day (BID) | INTRAVENOUS | Status: DC
  Administered 2016-11-07 (×2): 3 mL via INTRAVENOUS

## 2016-11-07 MED ORDER — ATORVASTATIN CALCIUM 20 MG PO TABS
40.0000 mg | ORAL_TABLET | Freq: Every day | ORAL | Status: DC
  Administered 2016-11-07 – 2016-11-08 (×2): 40 mg via ORAL
  Filled 2016-11-07 (×2): qty 2

## 2016-11-07 MED ORDER — ONDANSETRON HCL 4 MG PO TABS: 4.0000 mg | ORAL_TABLET | Freq: Four times a day (QID) | ORAL | Status: DC | PRN

## 2016-11-07 MED ORDER — LISINOPRIL 5 MG PO TABS
2.5000 mg | ORAL_TABLET | Freq: Every day | ORAL | Status: DC
  Administered 2016-11-07 – 2016-11-09 (×2): 2.5 mg via ORAL
  Filled 2016-11-07 (×2): qty 1

## 2016-11-07 MED ORDER — FUROSEMIDE 10 MG/ML IJ SOLN
40.0000 mg | Freq: Once | INTRAMUSCULAR | Status: AC
  Administered 2016-11-07: 40 mg via INTRAVENOUS
  Filled 2016-11-07: qty 4

## 2016-11-07 MED ORDER — INSULIN ASPART 100 UNIT/ML ~~LOC~~ SOLN
0.0000 [IU] | Freq: Three times a day (TID) | SUBCUTANEOUS | Status: DC
  Administered 2016-11-07: 3 [IU] via SUBCUTANEOUS
  Administered 2016-11-07: 5 [IU] via SUBCUTANEOUS
  Administered 2016-11-08: 3 [IU] via SUBCUTANEOUS
  Administered 2016-11-08 – 2016-11-09 (×2): 2 [IU] via SUBCUTANEOUS
  Filled 2016-11-07: qty 3
  Filled 2016-11-07: qty 5
  Filled 2016-11-07: qty 3
  Filled 2016-11-07 (×2): qty 2

## 2016-11-07 MED ORDER — SENNOSIDES-DOCUSATE SODIUM 8.6-50 MG PO TABS: 1.0000 | ORAL_TABLET | Freq: Every evening | ORAL | Status: DC | PRN

## 2016-11-07 MED ORDER — ALPRAZOLAM 0.5 MG PO TABS
0.5000 mg | ORAL_TABLET | Freq: Three times a day (TID) | ORAL | Status: DC | PRN
  Administered 2016-11-08: 0.5 mg via ORAL
  Filled 2016-11-07: qty 1

## 2016-11-07 MED ORDER — SODIUM CHLORIDE 0.9% FLUSH
3.0000 mL | Freq: Two times a day (BID) | INTRAVENOUS | Status: DC
  Administered 2016-11-07: 3 mL via INTRAVENOUS

## 2016-11-07 MED ORDER — ALBUTEROL SULFATE (2.5 MG/3ML) 0.083% IN NEBU: 2.5000 mg | INHALATION_SOLUTION | Freq: Four times a day (QID) | RESPIRATORY_TRACT | Status: DC | PRN

## 2016-11-07 MED ORDER — MOMETASONE FURO-FORMOTEROL FUM 200-5 MCG/ACT IN AERO
2.0000 | INHALATION_SPRAY | Freq: Two times a day (BID) | RESPIRATORY_TRACT | Status: DC
  Administered 2016-11-07 – 2016-11-09 (×4): 2 via RESPIRATORY_TRACT
  Filled 2016-11-07: qty 8.8
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
## 2016-11-07 NOTE — ED Notes (Signed)
Pt reports "lots of blood" to Network engineer, when this nurse entered room, other nurse and tech present, pt had rolled over in bed, disconnecting pigtail from IV hub through which blood transfusion was running.  Transfusion paused, pigtail reconnected and flushed to check for placement, blood restarted.  Pt's clothes and sheets changed, pt cleaned with wipes.

## 2016-11-07 NOTE — ED Notes (Signed)
Per floor, unable to take patient at this time since they do not have enough telemetry monitors.  States they have spoken with hospitalist about problem in order to obtain more monitors.  Floor to call ED back when a monitor is available.

## 2016-11-07 NOTE — Progress Notes (Signed)
Patient ID: [name], male   DOB: 03/28/1941, 76 y.o.   MRN: [id number]  [facility] PROGRESS NOTE  [name] [id number] DOB: 1940-11-01 DOA: 11/07/2016 PCP: Myriam Jacobson, MD  HPI/Subjective: Patient knows me from previous admissions. He states he's been having black stools. He was recently in the hospital and had an endoscopy which was negative.  Objective: Vitals:   11/07/16 0944 11/07/16 1517  BP: 113/62 (!) 91/38  Pulse: 93 85  Resp: 20   Temp: 97.6 F (36.4 C) 98 F (36.7 C)    Filed Weights   11/07/16 0017  Weight: 117 kg (258 lb)    ROS: Review of Systems  Constitutional: Negative for chills and fever.  Eyes: Negative for blurred vision.  Respiratory: Positive for shortness of breath. Negative for cough.   Cardiovascular: Negative for chest pain.  Gastrointestinal: Positive for melena. Negative for abdominal pain, constipation, diarrhea, nausea and vomiting.  Genitourinary: Negative for dysuria.  Musculoskeletal: Negative for joint pain.  Neurological: Negative for dizziness and headaches.   Exam: Physical Exam  Constitutional: He is oriented to person, place, and time.  HENT:  Nose: No mucosal edema.  Mouth/Throat: No oropharyngeal exudate or posterior oropharyngeal edema.  Eyes: Conjunctivae, EOM and lids are normal. Pupils are equal, round, and reactive to light.  Neck: No JVD present. Carotid bruit is not present. No edema present. No thyroid mass and no thyromegaly present.  Cardiovascular: S1 normal and S2 normal.  Exam reveals no gallop.   No murmur heard. Pulses:      Dorsalis pedis pulses are 2+ on the right side, and 2+ on the left side.  Respiratory: No respiratory distress. He has decreased breath sounds in the right lower field and the left lower field. He has no wheezes. He has no rhonchi. He has no rales.  GI: Soft. Bowel sounds are normal. There is no tenderness.  Musculoskeletal:       Right ankle: He exhibits  swelling.       Left ankle: He exhibits swelling.  Lymphadenopathy:    He has no cervical adenopathy.  Neurological: He is alert and oriented to person, place, and time. No cranial nerve deficit.  Skin: Skin is warm. No rash noted. Nails show no clubbing.  Psychiatric: He has a normal mood and affect.      Data Reviewed: Basic Metabolic Panel:  Recent Labs Lab 11/07/16 0033 11/07/16 1124  NA 138 137  K 4.7 4.3  CL 103 103  CO2 25 26  GLUCOSE 274* 194*  BUN 27* 25*  CREATININE 1.25* 1.15  CALCIUM 8.8* 8.2*   Liver Function Tests:  Recent Labs Lab 11/07/16 0033  AST 23  ALT 13*  ALKPHOS 106  BILITOT 0.3  PROT 6.7  ALBUMIN 3.5   CBC:  Recent Labs Lab 11/07/16 0033 11/07/16 1302  WBC 10.5 6.8  NEUTROABS 8.4* 5.2  HGB 7.4* 7.5*  HCT 21.9* 22.5*  MCV 88.9 88.6  PLT 260 242   Cardiac Enzymes:  Recent Labs Lab 11/07/16 0033  TROPONINI <0.03   BNP (last 3 results)  Recent Labs  10/09/16 0625 10/20/16 2142  BNP 58.0 131.0*     CBG:  Recent Labs Lab 11/07/16 1129  GLUCAP 220*      Studies: Dg Chest Port 1 View  Result Date: 11/07/2016 CLINICAL DATA:  76 year old male with shortness of breath. EXAM: PORTABLE CHEST 1 VIEW COMPARISON:  Chest radiograph dated 10/20/2016 and chest CT dated 07/01/2015 FINDINGS: Stable  left basilar density, likely atelectatic changes/ scarring. No new consolidative changes. There is no pleural effusion or pneumothorax. There is emphysematous changes of the lungs with postsurgical changes versus bulla in the left upper lobe. The cardiac silhouette is within normal limits. Median sternotomy wires and mediastinal and CABG surgical clips noted. Retrocardiac density corresponds to the hiatal hernia seen on the CT. Old healed right posterior rib fractures noted. No acute osseous pathology. IMPRESSION: No acute cardiopulmonary process. Electronically Signed   By: Anner Crete M.D.   On: 11/07/2016 01:38    Scheduled  Meds: . atorvastatin  40 mg Oral QHS  . cholecalciferol  1,000 Units Oral Daily  . citalopram  40 mg Oral Daily  . ferrous sulfate  325 mg Oral TID WC  . gabapentin  300 mg Oral TID  . insulin aspart  0-5 Units Subcutaneous QHS  . insulin aspart  0-9 Units Subcutaneous TID WC  . insulin glargine  20 Units Subcutaneous BID  . iron sucrose  400 mg Intravenous Once  . lisinopril  2.5 mg Oral Daily  . metFORMIN  500 mg Oral BID WC  . metoprolol tartrate  25 mg Oral BID  . mometasone-formoterol  2 puff Inhalation BID  . pantoprazole  40 mg Oral Daily  . [START ON 11/10/2016] pantoprazole  40 mg Intravenous Q12H  . sodium chloride flush  3 mL Intravenous Q12H  . sodium chloride flush  3 mL Intravenous Q12H  . sucralfate  1 g Oral TID WC & HS  . tiotropium  18 mcg Inhalation q morning - 10a  . torsemide  20 mg Oral BID   Continuous Infusions: . pantoprozole (PROTONIX) infusion 8 mg/hr (11/07/16 0110)    Assessment/Plan:  1. Acute on chronic blood loss anemia. Patient already received 1 unit of packed red blood cells and will receive another unit. I will also give IV Venofer. Patient will be set up for endoscopy tomorrow. Patient is on Protonix drip and Carafate. 2. 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 Continue usual nebulizers and inhalers 3. Hyperlipidemia unspecified on atorvastatin 4. Diabetes mellitus. Hold metformin and continue sliding scale 5. Depression on Celexa  6. Diabetic neuropathy on Neurontin  Code Status:     Code Status Orders        Start     Ordered   11/07/16 1020  Full code  Continuous     11/07/16 1020    Code Status History    Date Active Date Inactive Code Status Order ID Comments User Context   10/21/2016  4:14 AM 10/25/2016  3:03 PM Full Code [id number]  Howard Bridge, DO Inpatient   10/09/2016 11:52 AM 10/12/2016  4:49 PM Full Code [id number]  Howard Butte, MD ED   10/09/2016 11:05 AM 10/09/2016 11:52 AM DNR [id number]  Howard Butte, MD ED    07/24/2016  8:06 AM 07/27/2016  5:59 PM DNR [id number]  Howard Foreman, MD Inpatient   02/10/2016  3:28 PM 02/12/2016  8:09 PM DNR [id number]  Howard Grayer, MD ED   10/26/2015  3:14 AM 11/01/2015  8:05 PM Full Code [id number]  Howard Shelling, MD Inpatient   08/20/2015  4:41 AM 08/22/2015  4:49 PM Full Code [id number]  Howard Butte, MD ED   08/20/2015  2:54 AM 08/20/2015  4:41 AM Full Code [id number]  Howard Butte, MD ED   07/27/2015  8:40 PM 07/29/2015  6:29 PM Full Code [id number]  Howard Butte, MD ED  06/30/2015 12:40 PM 07/03/2015  3:26 PM Full Code [id number]  Bettey Costa, MD Inpatient   05/19/2015  2:59 PM 05/23/2015  4:21 PM Full Code [id number]  Max Sane, MD Inpatient   05/06/2015  5:14 PM 05/13/2015  5:06 PM Full Code [id number]  Epifanio Lesches, MD ED   05/03/2015 12:29 PM 05/04/2015  7:20 PM Full Code [id number]  Idelle Crouch, MD Inpatient   04/22/2015  4:50 AM 04/27/2015  7:35 PM Full Code [id number]  Howard Foreman, MD Inpatient   04/17/2015  7:16 PM 04/19/2015  5:16 PM Full Code [id number]  Clovis Fredrickson, MD Inpatient   04/10/2015  9:12 AM 04/17/2015  7:08 PM Full Code [id number]  Demetrios Loll, MD Inpatient   03/09/2015 12:18 PM 03/12/2015  6:45 PM Full Code [id number]  Henreitta Leber, MD Inpatient   08/24/2014  9:41 PM 08/25/2014  5:20 PM Full Code [id number]  Ivor Costa, MD ED   04/02/2014  8:11 PM 04/04/2014  5:10 PM Full Code [id number]  Velvet Bathe, MD Inpatient   03/18/2014  3:44 PM 03/22/2014  2:06 PM Full Code [id number]  Verlee Monte, MD Inpatient   12/04/2013  6:13 AM 12/10/2013  7:54 PM Full Code [id number]  Rise Patience, MD Inpatient   10/15/2013  8:11 PM 10/18/2013  6:40 PM Full Code [id number]  Fuller Plan, MD Inpatient   09/08/2013 12:43 AM 09/10/2013  3:30 PM Full Code [id number]  Toy Baker, MD Inpatient   08/19/2013  5:48 PM 08/22/2013  3:51 PM Full Code [id number]  Orson Eva, MD ED   05/15/2013  1:26 AM 05/17/2013  4:59 PM Full Code [id number]  Theodis Blaze, MD Inpatient   12/04/2012  5:23  PM 12/08/2012  4:56 PM Full Code [id number]  Janece Canterbury, MD Inpatient   05/17/2012 12:56 PM 05/21/2012  6:09 PM Full Code [id number]  Campbell Lerner, RN ED   04/06/2012  7:35 PM 04/14/2012  5:23 PM Full Code [id number]  Marylou Mccoy, RN Inpatient   03/27/2012  4:44 AM 03/28/2012  5:56 PM Full Code [id number]  Theotis Barrio, RN Inpatient     Disposition Plan: To be determined based on hemoglobin and his status  Consultants:  Gastroenterology  Time spent: 35 minutes  [place], [place]
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
## 2016-11-07 NOTE — ED Notes (Signed)
Per Dr. Estanislado Pandy, wait to transfuse blood until pt on floor so that pt can receive type specific blood

## 2016-11-07 NOTE — ED Triage Notes (Signed)
Pt coming from home via EMS for increased in SOB, weakness, tarry stools. Pt states he was just here about a week ago, can't remember but believes he got blood last time he was here. States he is always in pain. And always on 6L [place] at home and bumped himself up to 7L.
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
## 2016-11-07 NOTE — Progress Notes (Signed)
Inpatient Diabetes Program Recommendations  AACE/ADA: New Consensus Statement on Inpatient Glycemic Control (2015)  Target Ranges:  Prepandial:   less than 140 mg/dL      Peak postprandial:   less than 180 mg/dL (1-2 hours)      Critically ill patients:  140 - 180 mg/dL   Results for LUAN, LEJA (MRN [id number]) as of 11/07/2016 09:44  Ref. Range 11/07/2016 00:33  Glucose Latest Ref Range: 65 - 99 mg/dL 274 (H)   Review of Glycemic Control  Diabetes history: DM2 Outpatient Diabetes medications: Lantus 20 units BID, Novolog 10 units TID with meals Current orders for Inpatient glycemic control:  Lantus 20 units BID, Novolog 10 units TID with meals (both signed and held orders; not yet released)  Inpatient Diabetes Program Recommendations: Correction (SSI): Please consider ordering Novolog moderate correction scale Q4H. Insulin - Meal Coverage: Noted held order for Novolog 10 units with meals. Please consider discontinuing ordered meal coverage and may want to consider ordering meal coverage once diet is resumed and patient is eating at least 50% of meals.  Thanks, Barnie Alderman, RN, MSN, CDE Diabetes Coordinator Inpatient Diabetes Program [phone] (Team Pager from 8am to 5pm)
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
## 2016-11-08 ENCOUNTER — Encounter
Admission: EM | Disposition: A | Discharge status: Home / Self Care | Payer: Self-pay | Source: Home / Self Care | Attending: Internal Medicine

## 2016-11-08 ENCOUNTER — Observation Stay: Payer: Medicare HMO | Admitting: Certified Registered"

## 2016-11-08 ENCOUNTER — Encounter: Payer: Self-pay | Admitting: *Deleted

## 2016-11-08 DIAGNOSIS — K31819 Angiodysplasia of stomach and duodenum without bleeding: Secondary | ICD-10-CM | POA: Diagnosis not present

## 2016-11-08 DIAGNOSIS — D500 Iron deficiency anemia secondary to blood loss (chronic): Secondary | ICD-10-CM | POA: Diagnosis not present

## 2016-11-08 HISTORY — PX: ESOPHAGOGASTRODUODENOSCOPY (EGD) WITH PROPOFOL: SHX5813

## 2016-11-08 LAB — GLUCOSE, CAPILLARY
GLUCOSE-CAPILLARY: 232 mg/dL — AB (ref 65–99)
Glucose-Capillary: 165 mg/dL — ABNORMAL HIGH (ref 65–99)
Glucose-Capillary: 255 mg/dL — ABNORMAL HIGH (ref 65–99)
Glucose-Capillary: 192 mg/dL — ABNORMAL HIGH (ref 65–99)
Glucose-Capillary: 205 mg/dL — ABNORMAL HIGH (ref 65–99)

## 2016-11-08 LAB — PREPARE RBC (CROSSMATCH)

## 2016-11-08 LAB — HEMOGLOBIN: Hemoglobin: 8.6 g/dL — ABNORMAL LOW (ref 13.0–18.0)

## 2016-11-08 SURGERY — ESOPHAGOGASTRODUODENOSCOPY (EGD) WITH PROPOFOL
Anesthesia: General

## 2016-11-08 MED ORDER — TRAMADOL-ACETAMINOPHEN 37.5-325 MG PO TABS
1.0000 | ORAL_TABLET | Freq: Two times a day (BID) | ORAL | Status: DC | PRN
  Administered 2016-11-09: 1 via ORAL
  Filled 2016-11-08: qty 1

## 2016-11-08 MED ORDER — LIDOCAINE HCL (PF) 2 % IJ SOLN
INTRAMUSCULAR | Status: AC
  Filled 2016-11-08: qty 2

## 2016-11-08 MED ORDER — SODIUM CHLORIDE 0.9 % IV SOLN: Freq: Once | INTRAVENOUS | Status: DC

## 2016-11-08 MED ORDER — PROPOFOL 500 MG/50ML IV EMUL
INTRAVENOUS | Status: DC | PRN
  Administered 2016-11-08: 100 ug/kg/min via INTRAVENOUS

## 2016-11-08 MED ORDER — FENTANYL CITRATE (PF) 100 MCG/2ML IJ SOLN
INTRAMUSCULAR | Status: AC
  Filled 2016-11-08: qty 2

## 2016-11-08 MED ORDER — GLYCOPYRROLATE 0.2 MG/ML IJ SOLN
INTRAMUSCULAR | Status: AC
  Filled 2016-11-08: qty 1

## 2016-11-08 MED ORDER — GLYCOPYRROLATE 0.2 MG/ML IJ SOLN
INTRAMUSCULAR | Status: DC | PRN
  Administered 2016-11-08: 0.2 mg via INTRAVENOUS

## 2016-11-08 MED ORDER — PROPOFOL 10 MG/ML IV BOLUS
INTRAVENOUS | Status: DC | PRN
  Administered 2016-11-08: 50 mg via INTRAVENOUS

## 2016-11-08 MED ORDER — LIDOCAINE HCL (PF) 2 % IJ SOLN
INTRAMUSCULAR | Status: DC | PRN
  Administered 2016-11-08: 80 mg via INTRADERMAL

## 2016-11-08 MED ORDER — FENTANYL CITRATE (PF) 100 MCG/2ML IJ SOLN
INTRAMUSCULAR | Status: DC | PRN
  Administered 2016-11-08: 50 ug via INTRAVENOUS

## 2016-11-08 MED ORDER — SODIUM CHLORIDE 0.9 % IV SOLN
400.0000 mg | Freq: Once | INTRAVENOUS | Status: AC
  Administered 2016-11-09: 400 mg via INTRAVENOUS
  Filled 2016-11-08: qty 20

## 2016-11-08 MED ORDER — FUROSEMIDE 10 MG/ML IJ SOLN
40.0000 mg | Freq: Once | INTRAMUSCULAR | Status: AC
  Administered 2016-11-08: 40 mg via INTRAVENOUS
  Filled 2016-11-08: qty 4

## 2016-11-08 MED ORDER — SODIUM CHLORIDE 0.9 % IV SOLN
INTRAVENOUS | Status: DC
  Administered 2016-11-08: 09:00:00 via INTRAVENOUS

## 2016-11-08 MED ORDER — GABAPENTIN 300 MG PO CAPS
300.0000 mg | ORAL_CAPSULE | Freq: Once | ORAL | Status: AC
  Administered 2016-11-08: 300 mg via ORAL
  Filled 2016-11-08: qty 1

## 2016-11-08 NOTE — Plan of Care (Signed)
Problem: Physical Regulation: Goal: Will remain free from infection Outcome: Progressing Reinforced education on the importance of hand washing in preventing infection

## 2016-11-08 NOTE — Transfer of Care (Signed)
Immediate Anesthesia Transfer of Care Note  Patient: [name]  Procedure(s) Performed: Procedure(s): ESOPHAGOGASTRODUODENOSCOPY (EGD) WITH PROPOFOL (N/A)  Patient Location: PACU  Anesthesia Type:General  Level of Consciousness: awake  Airway & Oxygen Therapy: Patient Spontanous Breathing and Patient connected to nasal cannula oxygen  Post-op Assessment: Report given to RN and Post -op Vital signs reviewed and stable  Post vital signs: Reviewed and stable  Last Vitals:  Vitals:   11/08/16 0836 11/08/16 0924  BP: 114/65 (!) 99/47  Pulse: 73 97  Resp: 20 18  Temp: 36.4 C     Last Pain:  Vitals:   11/08/16 0836  TempSrc: Tympanic  PainSc:       Patients Stated Pain Goal: 0 ([id number])  Complications: No apparent anesthesia complications

## 2016-11-08 NOTE — Anesthesia Postprocedure Evaluation (Signed)
Anesthesia Post Note  Patient: [name]  Procedure(s) Performed: Procedure(s) (LRB): ESOPHAGOGASTRODUODENOSCOPY (EGD) WITH PROPOFOL (N/A)  Patient location during evaluation: Endoscopy Anesthesia Type: General Level of consciousness: awake and alert and oriented Pain management: pain level controlled Vital Signs Assessment: post-procedure vital signs reviewed and stable Respiratory status: spontaneous breathing, nonlabored ventilation and respiratory function stable Cardiovascular status: blood pressure returned to baseline and stable Postop Assessment: no signs of nausea or vomiting Anesthetic complications: no     Last Vitals:  Vitals:   11/08/16 0836 11/08/16 0924  BP: 114/65 (!) 99/47  Pulse: 73 97  Resp: 20 18  Temp: 36.4 C     Last Pain:  Vitals:   11/08/16 0836  TempSrc: Tympanic  PainSc:                  [name]

## 2016-11-08 NOTE — Care Management Obs Status (Signed)
[place] NOTIFICATION   Patient Details  Name: [name] MRN: [id number] Date of Birth: 09/14/1941   Medicare Observation Status Notification Given:  Yes    Beverly Sessions, RN 11/08/2016, 5:16 PM

## 2016-11-08 NOTE — Progress Notes (Signed)
PT Cancellation Note  Patient Details Name: [name] MRN: [id number] DOB: 1941/09/13   Cancelled Treatment:    Reason Eval/Treat Not Completed:  (pt declined) Pt states that he has not been able to sleep and is unwilling to try walking with PT until he gets some rest.  States he may be willing to try tomorrow, but adamantly refuses to do anything today.   Kreg Shropshire , DPT 11/08/2016, 2:32 PM

## 2016-11-08 NOTE — Anesthesia Preprocedure Evaluation (Signed)
Anesthesia Evaluation  Patient identified by MRN, date of birth, ID band Patient awake    Reviewed: Allergy & Precautions, NPO status , Patient's Chart, lab work & pertinent test results  History of Anesthesia Complications Negative for: history of anesthetic complications  Airway Mallampati: II  TM Distance: >3 FB Neck ROM: Full    Dental  (+) Edentulous Upper, Edentulous Lower   Pulmonary shortness of breath and at rest, neg sleep apnea, COPD,  COPD inhaler and oxygen dependent, former smoker,    breath sounds clear to auscultation- rhonchi (-) wheezing      Cardiovascular hypertension, Pt. on medications + CAD, + CABG, + Peripheral Vascular Disease and +CHF   Rhythm:Regular Rate:Normal - Systolic murmurs and - Diastolic murmurs Echo [id number]: - Left ventricle: The cavity size was normal. There was mild   concentric hypertrophy. Systolic function was normal. The   estimated ejection fraction was in the range of 55% to 60%. Wall   motion was normal; there were no regional wall motion   abnormalities. Doppler parameters are consistent with abnormal   left ventricular relaxation (grade 1 diastolic dysfunction). - Aortic valve: There was mild regurgitation.   Neuro/Psych Anxiety Depression Bipolar Disorder negative neurological ROS     GI/Hepatic Neg liver ROS, GERD  ,  Endo/Other  diabetes, Insulin Dependent  Renal/GU negative Renal ROS     Musculoskeletal negative musculoskeletal ROS (+)   Abdominal (+) + obese,   Peds  Hematology  (+) anemia ,   Anesthesia Other Findings Past Medical History: No date: AAA (abdominal aortic aneurysm) ([place])     Comment: a. 12/2008 s/p 7cm, endovascular repair with               coiling right hypogastric artery  No date: Allergic rhinitis, cause unspecified 09/10/2013: Anxiety state No date: AVM (arteriovenous malformation) of colon with* 04/16/2015: Bipolar 1 disorder, mixed,  moderate (HCC) No date: Blood transfusion without reported diagnosis No date: CAD (coronary artery disease)     Comment: a. 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 No date: Chronic diastolic CHF (congestive heart failur*     Comment: a. 04/2015 Echo: EF 55-60%, no rwma, Gr 1 DD,               mild AI. No date: COPD (chronic obstructive pulmonary disease) (*     Comment: a. GOLD stage IV, started home O2. Severe               bullous disease of LUL. Prolonged intubation               after surgeries due to COPD. 01/14/2013: Depression No date: Diverticulosis No date: Essential hypertension 08/18/2009: Essential hypertension     Comment: Qualifier: Diagnosis of  By: Doy Mince LPN,               Megan   No date: GERD (gastroesophageal reflux disease) No date: GI bleed requiring more than 4 units of blood *     Comment: a. 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 presumed chronic GI blood loss. No date: Type II  diabetes mellitus ([place]) 08/10/2014: Vitamin D deficiency   Reproductive/Obstetrics                             Anesthesia Physical Anesthesia Plan  ASA: IV  Anesthesia Plan: General   Post-op Pain Management:    Induction: Intravenous  Airway Management Planned: Natural Airway  Additional Equipment:   Intra-op Plan:   Post-operative Plan:   Informed Consent: I have reviewed the patients History and Physical, chart, labs and discussed the procedure including the risks, benefits and alternatives for the proposed anesthesia with the patient or authorized representative who has indicated his/her understanding and acceptance.   Dental advisory given  Plan  Discussed with: CRNA and Anesthesiologist  Anesthesia Plan Comments:         Anesthesia Quick Evaluation

## 2016-11-08 NOTE — Progress Notes (Signed)
Patient ID: [name], male   DOB: August 21, 1941, 76 y.o.   MRN: [id number]  [facility] PROGRESS NOTE  [name] [id number] DOB: Dec 30, 1940 DOA: 11/07/2016 PCP: Myriam Jacobson, MD  HPI/Subjective: Patient states he only feels good when his hemoglobin is up high. Has shortness of breath. Still seeing some black stool.  Objective: Vitals:   11/08/16 1015 11/08/16 1544  BP: 125/60 118/65  Pulse: 90 (!) 102  Resp:    Temp:  97.3 F (36.3 C)    Filed Weights   11/07/16 0017 11/08/16 0836  Weight: 117 kg (258 lb) 117 kg (258 lb)    ROS: Review of Systems  Constitutional: Negative for chills and fever.  Eyes: Negative for blurred vision.  Respiratory: Positive for shortness of breath. Negative for cough.   Cardiovascular: Negative for chest pain.  Gastrointestinal: Positive for melena. Negative for abdominal pain, constipation, diarrhea, nausea and vomiting.  Genitourinary: Negative for dysuria.  Musculoskeletal: Negative for joint pain.  Neurological: Negative for dizziness and headaches.   Exam: Physical Exam  Constitutional: He is oriented to person, place, and time.  HENT:  Nose: No mucosal edema.  Mouth/Throat: No oropharyngeal exudate or posterior oropharyngeal edema.  Eyes: Conjunctivae, EOM and lids are normal. Pupils are equal, round, and reactive to light.  Neck: No JVD present. Carotid bruit is not present. No edema present. No thyroid mass and no thyromegaly present.  Cardiovascular: S1 normal and S2 normal.  Exam reveals no gallop.   No murmur heard. Pulses:      Dorsalis pedis pulses are 2+ on the right side, and 2+ on the left side.  Respiratory: No respiratory distress. He has decreased breath sounds in the right lower field and the left lower field. He has no wheezes. He has no rhonchi. He has no rales.  GI: Soft. Bowel sounds are normal. There is no tenderness.  Musculoskeletal:       Right ankle: He exhibits swelling.       Left  ankle: He exhibits swelling.  Lymphadenopathy:    He has no cervical adenopathy.  Neurological: He is alert and oriented to person, place, and time. No cranial nerve deficit.  Skin: Skin is warm. No rash noted. Nails show no clubbing.  Psychiatric: He has a normal mood and affect.      Data Reviewed: Basic Metabolic Panel:  Recent Labs Lab 11/07/16 0033 11/07/16 1124  NA 138 137  K 4.7 4.3  CL 103 103  CO2 25 26  GLUCOSE 274* 194*  BUN 27* 25*  CREATININE 1.25* 1.15  CALCIUM 8.8* 8.2*   Liver Function Tests:  Recent Labs Lab 11/07/16 0033  AST 23  ALT 13*  ALKPHOS 106  BILITOT 0.3  PROT 6.7  ALBUMIN 3.5   CBC:  Recent Labs Lab 11/07/16 0033 11/07/16 1302 11/08/16 0512  WBC 10.5 6.8  --   NEUTROABS 8.4* 5.2  --   HGB 7.4* 7.5* 8.6*  HCT 21.9* 22.5*  --   MCV 88.9 88.6  --   PLT 260 242  --    Cardiac Enzymes:  Recent Labs Lab 11/07/16 0033  TROPONINI <0.03   BNP (last 3 results)  Recent Labs  10/09/16 0625 10/20/16 2142  BNP 58.0 131.0*     CBG:  Recent Labs Lab 11/07/16 1129 11/07/16 1615 11/07/16 2140 11/08/16 0728 11/08/16 1145  GLUCAP 220* 272* 231* 165* 232*      Studies: Dg Chest Port 1 View  Result Date:  11/07/2016 CLINICAL DATA:  76 year old male with shortness of breath. EXAM: PORTABLE CHEST 1 VIEW COMPARISON:  Chest radiograph dated 10/20/2016 and chest CT dated 07/01/2015 FINDINGS: Stable left basilar density, likely atelectatic changes/ scarring. No new consolidative changes. There is no pleural effusion or pneumothorax. There is emphysematous changes of the lungs with postsurgical changes versus bulla in the left upper lobe. The cardiac silhouette is within normal limits. Median sternotomy wires and mediastinal and CABG surgical clips noted. Retrocardiac density corresponds to the hiatal hernia seen on the CT. Old healed right posterior rib fractures noted. No acute osseous pathology. IMPRESSION: No acute  cardiopulmonary process. Electronically Signed   By: Anner Crete M.D.   On: 11/07/2016 01:38    Scheduled Meds: . sodium chloride   Intravenous Once  . atorvastatin  40 mg Oral QHS  . chlorhexidine  15 mL Mouth Rinse BID  . cholecalciferol  1,000 Units Oral Daily  . citalopram  40 mg Oral Daily  . ferrous sulfate  325 mg Oral TID WC  . gabapentin  300 mg Oral TID  . insulin aspart  0-5 Units Subcutaneous QHS  . insulin aspart  0-9 Units Subcutaneous TID WC  . insulin glargine  20 Units Subcutaneous BID  . [START ON 11/09/2016] iron sucrose  400 mg Intravenous Once  . lisinopril  2.5 mg Oral Daily  . mouth rinse  15 mL Mouth Rinse q12n4p  . metoprolol tartrate  25 mg Oral BID  . mometasone-formoterol  2 puff Inhalation BID  . [START ON 11/10/2016] pantoprazole  40 mg Intravenous Q12H  . sodium chloride flush  3 mL Intravenous Q12H  . sodium chloride flush  3 mL Intravenous Q12H  . sucralfate  1 g Oral TID WC & HS  . tiotropium  18 mcg Inhalation q morning - 10a  . torsemide  20 mg Oral BID   Continuous Infusions: . pantoprozole (PROTONIX) infusion 8 mg/hr (11/08/16 0557)    Assessment/Plan:  1. Acute on chronic blood loss anemia. Patient received 2 units of packed red blood cells yesterday along with IV iron. I will give another unit of packed red blood cells today and another dose of IV iron tomorrow morning. Patient is on Protonix drip and Carafate. Check hemoglobin tomorrow morning. Lasix with blood. Endoscopy today showed angiectasias which were treated. 2. 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 Continue usual nebulizers and inhalers 3. Hyperlipidemia unspecified on atorvastatin 4. Diabetes mellitus. Hold metformin and continue sliding scale and Lantus. 5. Depression on Celexa  6. Diabetic neuropathy on Neurontin 7. Weakness. Get physical therapy evaluation  Code Status:     Code Status Orders        Start     Ordered   11/07/16 1020  Full code  Continuous      11/07/16 1020    Code Status History    Date Active Date Inactive Code Status Order ID Comments User Context   10/21/2016  4:14 AM 10/25/2016  3:03 PM Full Code [id number]  Harvie Bridge, DO Inpatient   10/09/2016 11:52 AM 10/12/2016  4:49 PM Full Code [id number]  Lytle Butte, MD ED   10/09/2016 11:05 AM 10/09/2016 11:52 AM DNR [id number]  Lytle Butte, MD ED   07/24/2016  8:06 AM 07/27/2016  5:59 PM DNR [id number]  Harrie Foreman, MD Inpatient   02/10/2016  3:28 PM 02/12/2016  8:09 PM DNR [id number]  Loletha Grayer, MD ED   10/26/2015  3:14 AM 11/01/2015  8:05  PM Full Code [id number]  Saundra Shelling, MD Inpatient   08/20/2015  4:41 AM 08/22/2015  4:49 PM Full Code [id number]  Lytle Butte, MD ED   08/20/2015  2:54 AM 08/20/2015  4:41 AM Full Code [id number]  Lytle Butte, MD ED   07/27/2015  8:40 PM 07/29/2015  6:29 PM Full Code [id number]  Lytle Butte, MD ED   06/30/2015 12:40 PM 07/03/2015  3:26 PM Full Code [id number]  Bettey Costa, MD Inpatient   05/19/2015  2:59 PM 05/23/2015  4:21 PM Full Code [id number]  Max Sane, MD Inpatient   05/06/2015  5:14 PM 05/13/2015  5:06 PM Full Code [id number]  Epifanio Lesches, MD ED   05/03/2015 12:29 PM 05/04/2015  7:20 PM Full Code [id number]  Idelle Crouch, MD Inpatient   04/22/2015  4:50 AM 04/27/2015  7:35 PM Full Code [id number]  Harrie Foreman, MD Inpatient   04/17/2015  7:16 PM 04/19/2015  5:16 PM Full Code [id number]  Clovis Fredrickson, MD Inpatient   04/10/2015  9:12 AM 04/17/2015  7:08 PM Full Code [id number]  Demetrios Loll, MD Inpatient   03/09/2015 12:18 PM 03/12/2015  6:45 PM Full Code [id number]  Henreitta Leber, MD Inpatient   08/24/2014  9:41 PM 08/25/2014  5:20 PM Full Code [id number]  Ivor Costa, MD ED   04/02/2014  8:11 PM 04/04/2014  5:10 PM Full Code [id number]  Velvet Bathe, MD Inpatient   03/18/2014  3:44 PM 03/22/2014  2:06 PM Full Code [id number]  Verlee Monte, MD Inpatient   12/04/2013  6:13 AM 12/10/2013  7:54 PM Full Code [id number]  Rise Patience, MD Inpatient    10/15/2013  8:11 PM 10/18/2013  6:40 PM Full Code [id number]  Fuller Plan, MD Inpatient   09/08/2013 12:43 AM 09/10/2013  3:30 PM Full Code [id number]  Toy Baker, MD Inpatient   08/19/2013  5:48 PM 08/22/2013  3:51 PM Full Code [id number]  Orson Eva, MD ED   05/15/2013  1:26 AM 05/17/2013  4:59 PM Full Code [id number]  Theodis Blaze, MD Inpatient   12/04/2012  5:23 PM 12/08/2012  4:56 PM Full Code [id number]  Janece Canterbury, MD Inpatient   05/17/2012 12:56 PM 05/21/2012  6:09 PM Full Code [id number]  Campbell Lerner, RN ED   04/06/2012  7:35 PM 04/14/2012  5:23 PM Full Code [id number]  Marylou Mccoy, RN Inpatient   03/27/2012  4:44 AM 03/28/2012  5:56 PM Full Code [id number]  Theotis Barrio, RN Inpatient     Disposition Plan: To be determined based on hemoglobin and his status  Consultants:  Gastroenterology  Time spent: 25 minutes  [place], [place]

## 2016-11-08 NOTE — Progress Notes (Signed)
Inpatient Diabetes Program Recommendations  AACE/ADA: New Consensus Statement on Inpatient Glycemic Control (2015)  Target Ranges:  Prepandial:   less than 140 mg/dL      Peak postprandial:   less than 180 mg/dL (1-2 hours)      Critically ill patients:  140 - 180 mg/dL   Lab Results  Component Value Date   GLUCAP 165 (H) 11/08/2016   HGBA1C 7.0 (H) 07/24/2016    Review of Glycemic Control  Results for JAIDAN, FAGOT (MRN [id number]) as of 11/08/2016 11:20  Ref. Range 11/07/2016 11:29 11/07/2016 16:15 11/07/2016 21:40 11/08/2016 07:28  Glucose-Capillary Latest Ref Range: 65 - 99 mg/dL 220 (H) 272 (H) 231 (H) 165 (H)    Diabetes history: DM2 Outpatient Diabetes medications: Lantus 20 units BID, Novolog 10 units TID with meals  Current orders for Inpatient glycemic control:  Lantus 20 units BID, Novolog 0-9 units tid, Novolog 0-5 units qhs.   Inpatient Diabetes Program Recommendations: Please consider changing diet to heart healthy/carb modified.  Gentry Fitz, RN, BA, MHA, CDE Diabetes Coordinator Inpatient Diabetes Program  [phone] (Team Pager) [phone] ([place]) 11/08/2016 11:22 AM

## 2016-11-08 NOTE — Op Note (Signed)
[facility] Gastroenterology Patient Name: [name] Procedure Date: 11/08/2016 9:05 AM MRN: [id number] Account #: [id number] Date of Birth: 11-Sep-1941 Admit Type: Inpatient Age: 76 Room: [facility] ENDO ROOM 4 Gender: Male Note Status: Finalized Procedure:            Upper GI endoscopy Indications:          Iron deficiency anemia Providers:            [name] MD, MD Referring MD:         [name] (Referring MD) Medicines:            Propofol per Anesthesia Complications:        No immediate complications. Procedure:            Pre-Anesthesia Assessment:                       - Prior to the procedure, a History and Physical was                        performed, and patient medications and allergies were                        reviewed. The patient's tolerance of previous                        anesthesia was also reviewed. The risks and benefits of                        the procedure and the sedation options and risks were                        discussed with the patient. All questions were                        answered, and informed consent was obtained. Prior                        Anticoagulants: The patient has taken no previous                        anticoagulant or antiplatelet agents. ASA Grade                        Assessment: III - A patient with severe systemic                        disease. After reviewing the risks and benefits, the                        patient was deemed in satisfactory condition to undergo                        the procedure.                       After obtaining informed consent, the endoscope was                        passed under direct vision. Throughout the procedure,  the patient's blood pressure, pulse, and oxygen                        saturations were monitored continuously. The Endoscope                        was introduced through the mouth, and advanced to the       second part of duodenum. The upper GI endoscopy was                        accomplished without difficulty. The patient tolerated                        the procedure well. Findings:      The examined esophagus was normal.      A few no bleeding angioectasias were found at the gastroesophageal       junction, in the gastric body and in the gastric antrum. Coagulation for       hemostasis using argon beam at 2 liters/minute and 30 watts was       successful.      A single 10 mm sessile polyp with no bleeding was found in the second       portion of the duodenum.      A non-bleeding diverticulum was found in the second portion of the       duodenum. Impression:           - Normal esophagus.                       - A few non-bleeding angioectasias in the stomach.                        Treated with argon beam coagulation.                       - A single duodenal polyp.                       - Non-bleeding duodenal diverticulum.                       - No specimens collected. Recommendation:       - Return patient to hospital ward for ongoing care. Procedure Code(s):    --- Professional ---                       [id number], Esophagogastroduodenoscopy, flexible, transoral;                        with control of bleeding, any method Diagnosis Code(s):    --- Professional ---                       D50.9, Iron deficiency anemia, unspecified                       K31.819, Angiodysplasia of stomach and duodenum without                        bleeding CPT copyright 2016 [employer] Association. All rights reserved. The codes documented in this report are preliminary and upon coder review may  be  revised to meet current compliance requirements. [name] MD, MD 11/08/2016 9:25:27 AM This report has been signed electronically. Number of Addenda: 0 Note Initiated On: 11/08/2016 9:05 AM      [facility]

## 2016-11-08 NOTE — Anesthesia Post-op Follow-up Note (Cosign Needed)
Anesthesia QCDR form completed.        

## 2016-11-08 NOTE — Anesthesia Procedure Notes (Signed)
Performed by: Wynetta Seith Pre-anesthesia Checklist: Patient identified, Emergency Drugs available, Suction available, Patient being monitored and Timeout performed Patient Re-evaluated:Patient Re-evaluated prior to inductionOxygen Delivery Method: Nasal cannula Preoxygenation: Pre-oxygenation with 100% oxygen Intubation Type: IV induction       

## 2016-11-09 ENCOUNTER — Encounter: Payer: Self-pay | Admitting: Student

## 2016-11-09 DIAGNOSIS — R531 Weakness: Secondary | ICD-10-CM

## 2016-11-09 LAB — TYPE AND SCREEN
BLOOD PRODUCT EXPIRATION DATE: 201802062359
Blood Product Expiration Date: 201802012359
Blood Product Expiration Date: 201802222359
ISSUE DATE / TIME: 201801220452
ISSUE DATE / TIME: 201801221526
ISSUE DATE / TIME: 201801231557
UNIT TYPE AND RH: 1700
UNIT TYPE AND RH: 1700
Unit Type and Rh: 9500

## 2016-11-09 LAB — GLUCOSE, CAPILLARY: GLUCOSE-CAPILLARY: 189 mg/dL — AB (ref 65–99)

## 2016-11-09 LAB — HEMOGLOBIN: HEMOGLOBIN: 10.3 g/dL — AB (ref 13.0–18.0)

## 2016-11-09 MED ORDER — ZOLPIDEM TARTRATE 5 MG PO TABS: 5.0000 mg | ORAL_TABLET | Freq: Every evening | ORAL | 0 refills | Status: DC | PRN

## 2016-11-09 MED ORDER — PANTOPRAZOLE SODIUM 40 MG PO TBEC: 40.0000 mg | DELAYED_RELEASE_TABLET | Freq: Two times a day (BID) | ORAL | 6 refills | Status: DC

## 2016-11-09 MED ORDER — TRAMADOL-ACETAMINOPHEN 37.5-325 MG PO TABS: 1.0000 | ORAL_TABLET | Freq: Two times a day (BID) | ORAL | 0 refills | Status: DC | PRN

## 2016-11-09 NOTE — Care Management (Signed)
Patient admitted with chronic anemia.  Patient lives at home with wife.  Chronic O2.  Open with [facility] home [facility].  [name] with [facility] aware of admission.  Patient to discharge today. Wife to bring portable tank.  Home health orders placed.  [name] notified of discharge.  RNCM signing off.

## 2016-11-09 NOTE — Discharge Summary (Signed)
[facility] NAME: [name]    MR#:  [id number]  DATE OF BIRTH:  [birth year]  DATE OF ADMISSION:  11/07/2016 ADMITTING PHYSICIAN: Saundra Shelling, MD  DATE OF DISCHARGE: 11/09/2016  3:00 PM  PRIMARY CARE PHYSICIAN: Myriam Jacobson, MD     ADMISSION DIAGNOSIS:  Shortness of breath [R06.02] Generalized weakness [R53.1] Gastrointestinal hemorrhage, unspecified gastrointestinal hemorrhage type [K92.2] Anemia, unspecified type [D64.9]  DISCHARGE DIAGNOSIS:  Principal Problem:   Acute posthemorrhagic anemia Active Problems:   Anemia   GIB (gastrointestinal bleeding)   History of esophagogastroduodenoscopy (EGD)   Generalized weakness   SECONDARY DIAGNOSIS:   Past Medical History:  Diagnosis Date  . AAA (abdominal aortic aneurysm) ([place])    a. 12/2008 s/p 7cm, endovascular repair with coiling right hypogastric artery   . Allergic rhinitis, cause unspecified   . Anxiety state 09/10/2013  . AVM (arteriovenous malformation) of colon with hemorrhage   . Bipolar 1 disorder, mixed, moderate ([place]) 04/16/2015  . Blood transfusion without reported diagnosis   . CAD (coronary artery disease)    a. 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  . Chronic diastolic CHF (congestive heart failure) ([place])    a. 04/2015 Echo: EF 55-60%, no rwma, Gr 1 DD, mild AI.  Marland Kitchen COPD (chronic obstructive pulmonary disease) ([place])    a. GOLD stage IV, started home O2. Severe bullous disease of LUL. Prolonged intubation after surgeries due to COPD.  Marland Kitchen Depression 01/14/2013  . Diverticulosis   . Essential hypertension   . Essential hypertension 08/18/2009   Qualifier: Diagnosis of  By: Doy Mince LPN, Megan    . GERD (gastroesophageal reflux disease)   . GI bleed requiring more than 4 units of blood in 24 hours, ICU, or surgery    a. Hx bleeding gastric polyps, cecal & sigmoid AVMS s/p APC 03/30/14  . Hyperlipidemia   .  Insomnia 08/10/2014  . Leucocytosis 12/04/2013  . Memory loss   . Morbid obesity ([place])   . Multiple gastric polyps   . Recurrent Microcytic Anemia    a. presumed chronic GI blood loss.  . Type II diabetes mellitus ([place])   . Vitamin D deficiency 08/10/2014    .pro HOSPITAL COURSE:   Patient is a 76 year old Caucasian male with past medical history significant for history of upper gastrointestinal bleed, hiatal hernia, gastric polyps, bipolar disorder, anemia, chronic diastolic CHF, coronary artery disease, emphysema, hypertension, hyperlipidemia, who presented to the hospital with complaints of fatigue and weakness. Apparently patient was in the hospital for GI bleed workup, he was discharged home, however, the next day he developed black stools and presented back to the hospital. Patient's hemoglobin level was found to be 7.4, and 2 units of packed red blood cells were transfused. Initially on arrival to the hospital. He was hypoxic, tachycardic with heart rate of 126, tachypneic, which improved after resuscitation of volume and transfusion with packed red blood cells. The patient was admitted and was consulted by gastroenterologist, Dr. Allen Norris, underwent EGD on 11/08/2016, revealing normal esophagus, few nonbleeding angiectasias in the stomach, treated with argon beam coagulation, single duodenal polyp, nonbleeding duodenal diverticulum, no specimens were collected. Patient was followed closely. Postprocedure, he did not rebleed, however, required blood transfusion as well as iron intravenously. With this conservative therapy. Patient's condition improved, his hemoglobin remained stable, and he was felt to be stable to be discharged home. Discussion by problem: 1. Acute on chronic blood loss  anemia. Patient received 3 units of packed red blood cells during this admission along with IV iron. A shin had normal bowel movement today, no black stool, hemoglobin level remains stable. The patient is to  continue Protonix twice a day and Carafate. Check hemoglobin tomorrow as outpatient. Endoscopy showed angiectasias which were treated. No more bleeding noted, this was discussed this patient and explained to him that although unlikely, gastrointestinal bleed can recur, and he will need to come back to the hospital for further treatment, he voiced understanding 2. 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 Continue usual nebulizers and inhalers 3. Hyperlipidemia unspecified on atorvastatin 4. Diabetes mellitus. Resume metformin and  Lantus. 5. Depression on Celexa  6. Diabetic neuropathy on Neurontin 7. Weakness. The patient refused rehabilitation placement, resume home health services DISCHARGE CONDITIONS:   Stable  CONSULTS OBTAINED:  Treatment Team:  [name], MD  DRUG ALLERGIES:   Allergies  Allergen Reactions  . Morphine And Related Shortness Of Breath, Nausea And Vomiting, Rash and Other (See Comments)    Reaction:  Hallucinations   . Penicillins Anaphylaxis, Hives and Other (See Comments)    Has patient had a PCN reaction causing immediate rash, facial/tongue/throat swelling, SOB or lightheadedness with hypotension: Yes Has patient had a PCN reaction causing severe rash involving mucus membranes or skin necrosis: No Has patient had a PCN reaction that required hospitalization No Has patient had a PCN reaction occurring within the last 10 years: No If all of the above answers are "NO", then may proceed with Cephalosporin use.  . Demerol [Meperidine] Other (See Comments)    Reaction:  Hallucinations    . Dilaudid [Hydromorphone Hcl] Other (See Comments)    Reaction:  Hallucinations   . Levofloxacin Other (See Comments)    Reaction:  Unknown     DISCHARGE MEDICATIONS:   Discharge Medication List as of 11/09/2016 11:00 AM    CONTINUE these medications which have CHANGED   Details  pantoprazole (PROTONIX) 40 MG tablet Take 1 tablet (40 mg total) by mouth 2 (two) times daily.,  Starting Wed 11/09/2016, Normal      CONTINUE these medications which have NOT CHANGED   Details  acetaminophen (TYLENOL) 325 MG tablet Take 650 mg by mouth every 6 (six) hours as needed for mild pain, fever or headache. , Historical Med    albuterol (PROVENTIL HFA;VENTOLIN HFA) 108 (90 BASE) MCG/ACT inhaler Inhale 2 puffs into the lungs every 6 (six) hours as needed for wheezing or shortness of breath., Starting Wed 05/13/2015, Print    ALPRAZolam (XANAX) 0.25 MG tablet Take 0.5 mg by mouth 3 (three) times daily as needed for anxiety or sleep., Historical Med    atorvastatin (LIPITOR) 40 MG tablet Take 40 mg by mouth at bedtime., Historical Med    budesonide-formoterol (SYMBICORT) 160-4.5 MCG/ACT inhaler Inhale 2 puffs into the lungs 2 (two) times daily., Starting Fri 03/11/2016, Normal    carbamide peroxide (DEBROX) 6.5 % otic solution Place 5 drops into both ears 2 (two) times daily., Starting Wed 07/27/2016, Normal    cholecalciferol (VITAMIN D) 1000 UNITS tablet Take 1,000 Units by mouth daily., Historical Med    citalopram (CELEXA) 40 MG tablet Take 40 mg by mouth daily., Historical Med    ferrous sulfate 325 (65 FE) MG tablet Take 325 mg by mouth 3 (three) times daily with meals. , Historical Med    gabapentin (NEURONTIN) 300 MG capsule Take 300 mg by mouth 3 (three) times daily., Historical Med  insulin aspart (NOVOLOG) 100 UNIT/ML injection Inject 10 Units into the skin 3 (three) times daily before meals., Historical Med    insulin glargine (LANTUS) 100 UNIT/ML injection Inject 0.2 mLs (20 Units total) into the skin 2 (two) times daily., Starting Tue 08/02/2016, Normal    lisinopril (PRINIVIL,ZESTRIL) 2.5 MG tablet Take 2.5 mg by mouth daily., Historical Med    metFORMIN (GLUCOPHAGE) 500 MG tablet Take 500 mg by mouth 2 (two) times daily with a meal., Historical Med    metoprolol tartrate (LOPRESSOR) 25 MG tablet Take 25 mg by mouth 2 (two) times daily., Historical Med     sucralfate (CARAFATE) 1 G tablet Take 1 g by mouth 4 (four) times daily -  with meals and at bedtime., Historical Med    tiotropium (SPIRIVA) 18 MCG inhalation capsule Place 18 mcg into inhaler and inhale daily., Historical Med    torsemide (DEMADEX) 20 MG tablet Take 1 tablet (20 mg total) by mouth 2 (two) times daily., Starting Tue 08/02/2016, Normal    traMADol-acetaminophen (ULTRACET) 37.5-325 MG tablet Take 1 tablet by mouth 2 (two) times daily as needed for moderate pain., Historical Med    zolpidem (AMBIEN) 5 MG tablet Take 1 tablet (5 mg total) by mouth at bedtime as needed for sleep., Starting Wed 07/27/2016, Print         DISCHARGE INSTRUCTIONS:    The patient is to follow-up with primary care physician within one week after discharge, primary gastroenterologist  If you experience worsening of your admission symptoms, develop shortness of breath, life threatening emergency, suicidal or homicidal thoughts you must seek medical attention immediately by calling 911 or calling your MD immediately  if symptoms less severe.  You Must read complete instructions/literature along with all the possible adverse reactions/side effects for all the Medicines you take and that have been prescribed to you. Take any new Medicines after you have completely understood and accept all the possible adverse reactions/side effects.   Please note  You were cared for by a hospitalist during your hospital stay. If you have any questions about your discharge medications or the care you received while you were in the hospital after you are discharged, you can call the unit and asked to speak with the hospitalist on call if the hospitalist that took care of you is not available. Once you are discharged, your primary care physician will handle any further medical issues. Please note that NO REFILLS for any discharge medications will be authorized once you are discharged, as it is imperative that you return to  your primary care physician (or establish a relationship with a primary care physician if you do not have one) for your aftercare needs so that they can reassess your need for medications and monitor your lab values.    Today   CHIEF COMPLAINT:   Chief Complaint  Patient presents with  . Weakness  . Shortness of Breath  . GI Bleeding    HISTORY OF PRESENT ILLNESS:  [name]  is a 76 y.o. male with a known history of upper gastrointestinal bleed, hiatal hernia, gastric polyps, bipolar disorder, anemia, chronic diastolic CHF, coronary artery disease, emphysema, hypertension, hyperlipidemia, who presented to the hospital with complaints of fatigue and weakness. Apparently patient was in the hospital for GI bleed workup, he was discharged home, however, the next day he developed black stools and presented back to the hospital. Patient's hemoglobin level was found to be 7.4, and 2 units of packed red blood cells  were transfused. Initially on arrival to the hospital. He was hypoxic, tachycardic with heart rate of 126, tachypneic, which improved after resuscitation of volume and transfusion with packed red blood cells. The patient was admitted and was consulted by gastroenterologist, Dr. Allen Norris, underwent EGD on 11/08/2016, revealing normal esophagus, few nonbleeding angiectasias in the stomach, treated with argon beam coagulation, single duodenal polyp, nonbleeding duodenal diverticulum, no specimens were collected. Patient was followed closely. Postprocedure, he did not rebleed, however, required blood transfusion as well as iron intravenously. With this conservative therapy. Patient's condition improved, his hemoglobin remained stable, and he was felt to be stable to be discharged home. Discussion by problem: 8. Acute on chronic blood loss anemia. Patient received 3 units of packed red blood cells during this admission along with IV iron. A shin had normal bowel movement today, no black stool,  hemoglobin level remains stable. The patient is to continue Protonix twice a day and Carafate. Check hemoglobin tomorrow as outpatient. Endoscopy showed angiectasias which were treated. No more bleeding noted, this was discussed this patient and explained to him that although unlikely, gastrointestinal bleed can recur, and he will need to come back to the hospital for further treatment, he voiced understanding 9. 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 Continue usual nebulizers and inhalers 10. Hyperlipidemia unspecified on atorvastatin 11. Diabetes mellitus. Resume metformin and  Lantus. 12. Depression on Celexa  13. Diabetic neuropathy on Neurontin 14. Weakness. The patient refused rehabilitation placement, resume home health services    VITAL SIGNS:  Blood pressure (!) 102/52, pulse 91, temperature 98 F (36.7 C), temperature source Oral, resp. rate 18, height 6\' 2"  (1.88 m), weight 117 kg (258 lb), SpO2 100 %.  I/O:   Intake/Output Summary (Last 24 hours) at 11/09/16 1531 Last data filed at 11/09/16 1410  Gross per 24 hour  Intake             1058 ml  Output             2775 ml  Net            -1717 ml    PHYSICAL EXAMINATION:  GENERAL:  76 y.o.-year-old patient lying in the bed with no acute distress.  EYES: Pupils equal, round, reactive to light and accommodation. No scleral icterus. Extraocular muscles intact.  HEENT: Head atraumatic, normocephalic. Oropharynx and nasopharynx clear.  NECK:  Supple, no jugular venous distention. No thyroid enlargement, no tenderness.  LUNGS: Normal breath sounds bilaterally, no wheezing, rales,rhonchi or crepitation. No use of accessory muscles of respiration.  CARDIOVASCULAR: S1, S2 normal. No murmurs, rubs, or gallops.  ABDOMEN: Soft, non-tender, non-distended. Bowel sounds present. No organomegaly or mass.  EXTREMITIES: No pedal edema, cyanosis, or clubbing.  NEUROLOGIC: Cranial nerves II through XII are intact. Muscle strength 5/5 in all  extremities. Sensation intact. Gait not checked.  PSYCHIATRIC: The patient is alert and oriented x 3.  SKIN: No obvious rash, lesion, or ulcer.   DATA REVIEW:   CBC  Recent Labs Lab 11/07/16 1302  11/09/16 0432  WBC 6.8  --   --   HGB 7.5*  < > 10.3*  HCT 22.5*  --   --   PLT 242  --   --   < > = values in this interval not displayed.  Chemistries   Recent Labs Lab 11/07/16 0033 11/07/16 1124  NA 138 137  K 4.7 4.3  CL 103 103  CO2 25 26  GLUCOSE 274* 194*  BUN  27* 25*  CREATININE 1.25* 1.15  CALCIUM 8.8* 8.2*  AST 23  --   ALT 13*  --   ALKPHOS 106  --   BILITOT 0.3  --     Cardiac Enzymes  Recent Labs Lab 11/07/16 0033  TROPONINI <0.03    Microbiology Results  Results for orders placed or performed during the hospital encounter of 10/09/16  Blood culture (routine x 2)     Status: None   Collection Time: 10/09/16  9:09 AM  Result Value Ref Range Status   Specimen Description BLOOD LEFT ARM  Final   Special Requests   Final    BOTTLES DRAWN AEROBIC AND ANAEROBIC AER 11 ML ANA 10 ML   Culture NO GROWTH 5 DAYS  Final   Report Status 10/14/2016 FINAL  Final  Blood culture (routine x 2)     Status: None   Collection Time: 10/09/16  9:09 AM  Result Value Ref Range Status   Specimen Description BLOOD RIGHT ARM  Final   Special Requests   Final    BOTTLES DRAWN AEROBIC AND ANAEROBIC AER 11 ML ANA 9 ML   Culture NO GROWTH 5 DAYS  Final   Report Status 10/14/2016 FINAL  Final  MRSA PCR Screening     Status: None   Collection Time: 10/09/16 12:31 PM  Result Value Ref Range Status   MRSA by PCR NEGATIVE NEGATIVE Final    Comment:        The GeneXpert MRSA Assay (FDA approved for NASAL specimens only), is one component of a comprehensive MRSA colonization surveillance program. It is not intended to diagnose MRSA infection nor to guide or monitor treatment for MRSA infections.     RADIOLOGY:  No results found.  EKG:   Orders placed or performed  during the hospital encounter of 11/07/16  . EKG 12-Lead  . EKG 12-Lead      Management plans discussed with the patient, family and they are in agreement.  CODE STATUS:     Code Status Orders        Start     Ordered   11/07/16 1020  Full code  Continuous     11/07/16 1020    Code Status History    Date Active Date Inactive Code Status Order ID Comments User Context   10/21/2016  4:14 AM 10/25/2016  3:03 PM Full Code [id number]  Harvie Bridge, DO Inpatient   10/09/2016 11:52 AM 10/12/2016  4:49 PM Full Code [id number]  Lytle Butte, MD ED   10/09/2016 11:05 AM 10/09/2016 11:52 AM DNR [id number]  Lytle Butte, MD ED   07/24/2016  8:06 AM 07/27/2016  5:59 PM DNR [id number]  Harrie Foreman, MD Inpatient   02/10/2016  3:28 PM 02/12/2016  8:09 PM DNR [id number]  Loletha Grayer, MD ED   10/26/2015  3:14 AM 11/01/2015  8:05 PM Full Code [id number]  Saundra Shelling, MD Inpatient   08/20/2015  4:41 AM 08/22/2015  4:49 PM Full Code [id number]  Lytle Butte, MD ED   08/20/2015  2:54 AM 08/20/2015  4:41 AM Full Code [id number]  Lytle Butte, MD ED   07/27/2015  8:40 PM 07/29/2015  6:29 PM Full Code [id number]  Lytle Butte, MD ED   06/30/2015 12:40 PM 07/03/2015  3:26 PM Full Code [id number]  Bettey Costa, MD Inpatient   05/19/2015  2:59 PM 05/23/2015  4:21 PM Full Code [id number]  Max Sane, MD Inpatient   05/06/2015  5:14  PM 05/13/2015  5:06 PM Full Code [id number]  Epifanio Lesches, MD ED   05/03/2015 12:29 PM 05/04/2015  7:20 PM Full Code [id number]  Idelle Crouch, MD Inpatient   04/22/2015  4:50 AM 04/27/2015  7:35 PM Full Code [id number]  Harrie Foreman, MD Inpatient   04/17/2015  7:16 PM 04/19/2015  5:16 PM Full Code [id number]  Clovis Fredrickson, MD Inpatient   04/10/2015  9:12 AM 04/17/2015  7:08 PM Full Code [id number]  Demetrios Loll, MD Inpatient   03/09/2015 12:18 PM 03/12/2015  6:45 PM Full Code [id number]  Henreitta Leber, MD Inpatient   08/24/2014  9:41 PM 08/25/2014  5:20 PM Full Code [id number]  Ivor Costa, MD  ED   04/02/2014  8:11 PM 04/04/2014  5:10 PM Full Code [id number]  Velvet Bathe, MD Inpatient   03/18/2014  3:44 PM 03/22/2014  2:06 PM Full Code [id number]  Verlee Monte, MD Inpatient   12/04/2013  6:13 AM 12/10/2013  7:54 PM Full Code [id number]  Rise Patience, MD Inpatient   10/15/2013  8:11 PM 10/18/2013  6:40 PM Full Code [id number]  Fuller Plan, MD Inpatient   09/08/2013 12:43 AM 09/10/2013  3:30 PM Full Code [id number]  Toy Baker, MD Inpatient   08/19/2013  5:48 PM 08/22/2013  3:51 PM Full Code [id number]  Orson Eva, MD ED   05/15/2013  1:26 AM 05/17/2013  4:59 PM Full Code [id number]  Theodis Blaze, MD Inpatient   12/04/2012  5:23 PM 12/08/2012  4:56 PM Full Code [id number]  Janece Canterbury, MD Inpatient   05/17/2012 12:56 PM 05/21/2012  6:09 PM Full Code [id number]  Campbell Lerner, RN ED   04/06/2012  7:35 PM 04/14/2012  5:23 PM Full Code [id number]  Marylou Mccoy, RN Inpatient   03/27/2012  4:44 AM 03/28/2012  5:56 PM Full Code [id number]  Theotis Barrio, RN Inpatient      TOTAL TIME TAKING CARE OF THIS PATIENT: 40 minutes.    Theodoro Grist M.D on 11/09/2016 at 3:31 PM  Between 7am to 6pm - Pager - [phone]  After 6pm go to [email or web address] - password [email or web address] [facility]  Office  [phone]  CC: Primary care physician; Myriam Jacobson, MD

## 2016-11-09 NOTE — Discharge Instructions (Signed)
Gastrointestinal Bleeding °Introduction °Gastrointestinal bleeding is bleeding somewhere along the path food travels through the body (digestive tract). This path is anywhere between the mouth and the opening of the butt (anus). You may have blood in your poop (stools) or have black poop. If you throw up (vomit), there may be blood in it. °This condition can be mild, serious, or even life-threatening. If you have a lot of bleeding, you may need to stay in the hospital. °Follow these instructions at home: °· Take over-the-counter and prescription medicines only as told by your doctor. °· Eat foods that have a lot of fiber in them. These foods include whole grains, fruits, and vegetables. You can also try eating 1-3 prunes each day. °· Drink enough fluid to keep your pee (urine) clear or pale yellow. °· Keep all follow-up visits as told by your doctor. This is important. °Contact a doctor if: °· Your symptoms do not get better. °Get help right away if: °· Your bleeding gets worse. °· You feel dizzy or you pass out (faint). °· You feel weak. °· You have very bad cramps in your back or belly (abdomen). °· You pass large clumps of blood (clots) in your poop. °· Your symptoms are getting worse. °This information is not intended to replace advice given to you by your health care provider. Make sure you discuss any questions you have with your health care provider. °Document Released: 07/12/2008 Document Revised: 03/10/2016 Document Reviewed: 03/23/2015 °© 2017 Elsevier ° °

## 2016-11-09 NOTE — Progress Notes (Signed)
Pt to be discharged per MD order. IV removed. Instructions reviewed with pt and family, all questions answered. Will take pt out in wheelchair when ride arrives. Pt requesting scripts for Ambien and tramadol since he cannot see his primary for another week, MD to write these scripts.

## 2016-11-10 ENCOUNTER — Encounter: Payer: Self-pay | Admitting: Gastroenterology

## 2016-11-15 ENCOUNTER — Other Ambulatory Visit: Payer: Self-pay | Admitting: Gastroenterology

## 2016-11-16 ENCOUNTER — Ambulatory Visit (INDEPENDENT_AMBULATORY_CARE_PROVIDER_SITE_OTHER): Payer: Medicare HMO | Admitting: Pulmonary Disease

## 2016-11-16 ENCOUNTER — Encounter: Payer: Self-pay | Admitting: Pulmonary Disease

## 2016-11-16 ENCOUNTER — Telehealth: Payer: Self-pay | Admitting: Pulmonary Disease

## 2016-11-16 VITALS — BP 132/66 | HR 81 | Ht 75.0 in | Wt 255.0 lb

## 2016-11-16 DIAGNOSIS — J9611 Chronic respiratory failure with hypoxia: Secondary | ICD-10-CM | POA: Diagnosis not present

## 2016-11-16 DIAGNOSIS — J439 Emphysema, unspecified: Secondary | ICD-10-CM

## 2016-11-16 MED ORDER — MOMETASONE FURO-FORMOTEROL FUM 200-5 MCG/ACT IN AERO: 2.0000 | INHALATION_SPRAY | Freq: Two times a day (BID) | RESPIRATORY_TRACT | 0 refills | Status: DC

## 2016-11-16 NOTE — Patient Instructions (Signed)
For your COPD: Keep taking Symbicort as you are doing  For your chronic respiratory failure with hypoxemia: I'm concerned that she need more oxygen so I'm going to order a lung function test to evaluate this  We will see you back in 2 months or sooner if needed

## 2016-11-16 NOTE — Telephone Encounter (Signed)
pcc   Did anyone try to call pt. About his PFT

## 2016-11-16 NOTE — Assessment & Plan Note (Signed)
I'm concerned that [name] now needs more oxygen than he did before. This may be just the natural history of COPD, but given the crackles on exam I'm concerned he may have a concomitant fibrotic process.  In general his COPD has been stable. He is not smoking.  Plan: Continue Symbicort Okay to substitute Symbicort with [facility] which she has samples of at home Check pulmonary function test Follow-up with me in 2 months or sooner if needed

## 2016-11-16 NOTE — Assessment & Plan Note (Signed)
He will continue using 5-7 L of oxygen per minute. However, as detailed above am concerned about his worsening oxygenation. His chest x-ray recently did not show fibrotic change but his exam is worrisome for that.  Plan: May need High-resolution CT scan of the chest Pulmonary function test

## 2016-11-16 NOTE — Progress Notes (Signed)
Subjective:    Patient ID: [name], male    DOB: 1941/09/08, 76 y.o.   MRN: [id number]  Synopsis: Severe COPD per FEV1 44% in 2011.  Quit smoking in early 2011 after long smoking history.  Started on O2 in 2013 with chronic GI bleeding.  HPI Chief Complaint  Patient presents with  . Follow-up    pt hospitalized 2X since christmas.  pt states he is doing well, notes sob with exertion.    Keeven continues to struggle from GI bleeding.  He has been hosptialized again as recent as January 2018.  He had another endoscopy which he thinks showed another polyp, this was performed by Dr. Allen Norris.  He is receiving blood transfusions through Dr. Gary Fleet office at [facility].  He says that his breathing is OK as long as he is not bleeding.  He says that he has been using 5-7 Lpm, mostly 7 LPM.    He has a rare cough, doesn't produce much mucus.    He wheezes rarely, only in the mornings.   He still takes symbicort 160/4.5 2 puffs bid.  He is not having any problems with that right now.  He also has Dulera given to him by the hospital when he was there.    He is planning to see Dr. [name] as a new PCP today at 1:45.  Past Medical History:  Diagnosis Date  . AAA (abdominal aortic aneurysm) ([place])    a. 12/2008 s/p 7cm, endovascular repair with coiling right hypogastric artery   . Allergic rhinitis, cause unspecified   . Anxiety state 09/10/2013  . AVM (arteriovenous malformation) of colon with hemorrhage   . Bipolar 1 disorder, mixed, moderate ([place]) 04/16/2015  . Blood transfusion without reported diagnosis   . CAD (coronary artery disease)    a. 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  . Chronic diastolic CHF (congestive heart failure) ([place])    a. 04/2015 Echo: EF 55-60%, no rwma, Gr 1 DD, mild AI.  Marland Kitchen COPD (chronic obstructive pulmonary disease) ([place])    a. GOLD stage IV, started home O2. Severe bullous disease of LUL. Prolonged intubation after surgeries due  to COPD.  Marland Kitchen Depression 01/14/2013  . Diverticulosis   . Essential hypertension   . Essential hypertension 08/18/2009   Qualifier: Diagnosis of  By: Doy Mince LPN, Megan    . GERD (gastroesophageal reflux disease)   . GI bleed requiring more than 4 units of blood in 24 hours, ICU, or surgery    a. Hx bleeding gastric polyps, cecal & sigmoid AVMS s/p APC 03/30/14  . Hyperlipidemia   . Insomnia 08/10/2014  . Leucocytosis 12/04/2013  . Memory loss   . Morbid obesity ([place])   . Multiple gastric polyps   . Recurrent Microcytic Anemia    a. presumed chronic GI blood loss.  . Type II diabetes mellitus ([place])   . Vitamin D deficiency 08/10/2014     Review of Systems  Constitutional: Negative for activity change, appetite change, chills, fever and unexpected weight change.  HENT: Negative for congestion, postnasal drip, rhinorrhea and sneezing.   Respiratory: Positive for shortness of breath. Negative for cough and choking.   Cardiovascular: Negative for chest pain and leg swelling.       Objective:   Physical Exam  Vitals:   11/16/16 1213  BP: 132/66  BP Location: Right Arm  Cuff Size: Normal  Pulse: 81  SpO2: 92%  Weight: 255 lb (115.7 kg)  Height: 6\' 3"  (1.905 m)  O2 2 L  Gen: chronically ill appearing HENT: OP clear, TM's clear, neck supple PULM: Crackles bases, normal percussion CV: RRR, no mgr, trace edema GI: BS+, soft, nontender Derm: no cyanosis or rash Psyche: normal mood and affect   PFT: 2010 simple spirometry with FEV1 of 1.41L 44% predicted clear obstruction and flow volume loop 2014 simple spirometry FEV1 1.3 L, clear obstruction  Imaging: 10/2016 CXR Images personally reviewed > emphysema, no interstitial changes     Assessment & Plan:   COPD (chronic obstructive pulmonary disease) ([place]) I'm concerned that [name] now needs more oxygen than he did before. This may be just the natural history of COPD, but given the crackles on exam I'm concerned he may have a  concomitant fibrotic process.  In general his COPD has been stable. He is not smoking.  Plan: Continue Symbicort Okay to substitute Symbicort with [facility] which she has samples of at home Check pulmonary function test Follow-up with me in 2 months or sooner if needed  Chronic hypoxemic respiratory failure ([place]) He will continue using 5-7 L of oxygen per minute. However, as detailed above am concerned about his worsening oxygenation. His chest x-ray recently did not show fibrotic change but his exam is worrisome for that.  Plan: May need High-resolution CT scan of the chest Pulmonary function test   Updated Medication List Outpatient Encounter Prescriptions as of 11/16/2016  Medication Sig Dispense Refill  . acetaminophen (TYLENOL) 325 MG tablet Take 650 mg by mouth every 6 (six) hours as needed for mild pain, fever or headache.     . albuterol (PROVENTIL HFA;VENTOLIN HFA) 108 (90 BASE) MCG/ACT inhaler Inhale 2 puffs into the lungs every 6 (six) hours as needed for wheezing or shortness of breath. 1 Inhaler 2  . ALPRAZolam (XANAX) 0.25 MG tablet Take 0.5 mg by mouth 3 (three) times daily as needed for anxiety or sleep.    Marland Kitchen atorvastatin (LIPITOR) 40 MG tablet Take 40 mg by mouth at bedtime.    . carbamide peroxide (DEBROX) 6.5 % otic solution Place 5 drops into both ears 2 (two) times daily. 15 mL 0  . cholecalciferol (VITAMIN D) 1000 UNITS tablet Take 1,000 Units by mouth daily.    . citalopram (CELEXA) 40 MG tablet Take 40 mg by mouth daily.    . ferrous sulfate 325 (65 FE) MG tablet Take 325 mg by mouth 3 (three) times daily with meals.     . gabapentin (NEURONTIN) 300 MG capsule Take 300 mg by mouth 3 (three) times daily.    . insulin aspart (NOVOLOG) 100 UNIT/ML injection Inject 10 Units into the skin 3 (three) times daily before meals.    . insulin glargine (LANTUS) 100 UNIT/ML injection Inject 0.2 mLs (20 Units total) into the skin 2 (two) times daily. 10 mL 1  . lisinopril  (PRINIVIL,ZESTRIL) 2.5 MG tablet Take 2.5 mg by mouth daily.    . metFORMIN (GLUCOPHAGE) 500 MG tablet Take 500 mg by mouth 2 (two) times daily with a meal.    . metoprolol tartrate (LOPRESSOR) 25 MG tablet Take 25 mg by mouth 2 (two) times daily.    . mometasone-formoterol (DULERA) 200-5 MCG/ACT AERO Inhale 2 puffs into the lungs 2 (two) times daily.    . pantoprazole (PROTONIX) 40 MG tablet Take 1 tablet (40 mg total) by mouth 2 (two) times daily. 60 tablet 6  . sucralfate (CARAFATE) 1 G tablet Take 1 g by mouth 4 (four)  times daily -  with meals and at bedtime.    . torsemide (DEMADEX) 20 MG tablet Take 1 tablet (20 mg total) by mouth 2 (two) times daily. 60 tablet 1  . traMADol-acetaminophen (ULTRACET) 37.5-325 MG tablet Take 1 tablet by mouth 2 (two) times daily as needed for moderate pain. 14 tablet 0  . zolpidem (AMBIEN) 5 MG tablet Take 1 tablet (5 mg total) by mouth at bedtime as needed for sleep. 14 tablet 0  . [DISCONTINUED] budesonide-formoterol (SYMBICORT) 160-4.5 MCG/ACT inhaler Inhale 2 puffs into the lungs 2 (two) times daily. (Patient not taking: Reported on 11/16/2016) 1 Inhaler 5  . [DISCONTINUED] tiotropium (SPIRIVA) 18 MCG inhalation capsule Place 18 mcg into inhaler and inhale daily.     No facility-administered encounter medications on file as of 11/16/2016.

## 2016-11-17 NOTE — Telephone Encounter (Signed)
Spoke with pt. He is needing to be scheduled for a PFT. This has been scheduled on 12/12/16 at 4pm at our office. Pt wanted PFT scheduled at our office only. He made mention that BQ wanted him to have a CT done as well. This was not ordered but was mentioned in BQ's note that we may have the pt complete this.  BQ - please advise if the pt needs to be CT now or will we be holding off on this for now. Thanks.

## 2016-11-21 NOTE — Telephone Encounter (Signed)
PFT first then will order CT if necessary based on PFT results OK to do in [place] if he desires
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
## 2016-11-22 NOTE — Telephone Encounter (Signed)
Spoke with pt and advised that per Dr Lake Bells we will wait to get PFT results before we decide to order CT.  Pt verbalized understanding.  Nothing further needed.

## 2016-11-23 ENCOUNTER — Inpatient Hospital Stay: Payer: Medicare HMO | Admitting: Oncology

## 2016-11-24 ENCOUNTER — Telehealth: Payer: Self-pay | Admitting: Pulmonary Disease

## 2016-11-24 NOTE — Telephone Encounter (Signed)
Spoke with pt. States that his wife has stopped helping him with his healthcare. She is refusing to provide him transportation to his appointments anymore. He has an upcoming appointment for a PFT. States he will try to find someone to bring him to this appointment. Nothing further was needed.

## 2016-12-02 ENCOUNTER — Telehealth: Payer: Self-pay | Admitting: Pulmonary Disease

## 2016-12-02 NOTE — Telephone Encounter (Signed)
Called and spoke to Dominica with [facility]. Lorriane Shire states she is needing a VO for pt's insulin, and this has not be filled by B[facility] before. Advised her that the pt's PCP or endocrinologist should be managing this. Lorriane Shire verbalized understanding and also questioned if BQ would give VO for social work to help find pt's insulin cheaper, also advised Lorriane Shire that this too should be deferred to primary care. Lorriane Shire verbalized understanding and denies any further questions or concerns at this time.

## 2016-12-08 ENCOUNTER — Telehealth: Payer: Self-pay | Admitting: Pulmonary Disease

## 2016-12-08 NOTE — Telephone Encounter (Signed)
Pt called and just wanted to inform her he is aware that the home nurse contacted the wrong office when they called for his insulin. I informed the pt we figured that, that was the case. He had no further questions. Nothing further is needed at this time.

## 2016-12-12 ENCOUNTER — Ambulatory Visit (INDEPENDENT_AMBULATORY_CARE_PROVIDER_SITE_OTHER): Payer: Medicare HMO | Admitting: Pulmonary Disease

## 2016-12-12 DIAGNOSIS — J9611 Chronic respiratory failure with hypoxia: Secondary | ICD-10-CM | POA: Diagnosis not present

## 2016-12-12 LAB — PULMONARY FUNCTION TEST
DL/VA % pred: 50 %
DL/VA: 2.36 ml/min/mmHg/L
DLCO COR % PRED: 30 %
DLCO UNC: 9.04 ml/min/mmHg
DLCO cor: 10.6 ml/min/mmHg
DLCO unc % pred: 25 %
FEF 25-75 PRE: 0.61 L/s
FEF2575-%PRED-PRE: 25 %
FEV1-%Pred-Pre: 45 %
FEV1-PRE: 1.49 L
FEV1FVC-%Pred-Pre: 73 %
FEV6-%PRED-PRE: 61 %
FEV6-Pre: 2.65 L
FEV6FVC-%Pred-Pre: 101 %
FVC-%PRED-PRE: 60 %
FVC-PRE: 2.78 L
PRE FEV1/FVC RATIO: 54 %
Pre FEV6/FVC Ratio: 95 %

## 2016-12-15 ENCOUNTER — Inpatient Hospital Stay: Payer: Medicare HMO | Admitting: Oncology

## 2017-01-02 ENCOUNTER — Other Ambulatory Visit: Payer: Self-pay

## 2017-01-06 ENCOUNTER — Telehealth: Payer: Self-pay | Admitting: Pulmonary Disease

## 2017-01-06 DIAGNOSIS — J9611 Chronic respiratory failure with hypoxia: Secondary | ICD-10-CM

## 2017-01-06 NOTE — Telephone Encounter (Signed)
Called and spoke with pt and he is aware that we will get this message over to BQ to review the PFT and see what is the next step.  BQ please advise. thanks
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
## 2017-01-09 ENCOUNTER — Inpatient Hospital Stay: Payer: Medicare HMO | Admitting: Oncology

## 2017-01-09 ENCOUNTER — Telehealth: Payer: Self-pay

## 2017-01-09 NOTE — Telephone Encounter (Signed)
BQ please advise. thanks 

## 2017-01-09 NOTE — Telephone Encounter (Signed)
Spoke with pt, aware of results/recs.  hrct ordered.  Nothing further needed.
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
## 2017-01-09 NOTE — Telephone Encounter (Signed)
I'd like for him to have a HRCT to evaluate for ILD.  Dx: chronic respiratory failure with hypoxemia. Let him know this PFT didn't show worsening COPD, so given the extra oxygen need lately I want to rule out interstitial lung disease. CAn be done at [facility], must be read by either Drs Weber Cooks, [employer]

## 2017-01-17 ENCOUNTER — Inpatient Hospital Stay: Admission: RE | Admit: 2017-01-17 | Payer: Medicare HMO | Source: Ambulatory Visit

## 2017-01-24 ENCOUNTER — Ambulatory Visit: Payer: Medicare HMO | Admitting: Pulmonary Disease

## 2017-02-22 ENCOUNTER — Inpatient Hospital Stay
Admission: EM | Admit: 2017-02-22 | Discharge: 2017-02-24 | DRG: 872 | Disposition: A | Discharge status: Home Health | Payer: Medicare HMO | Attending: Internal Medicine | Admitting: Internal Medicine

## 2017-02-22 ENCOUNTER — Encounter: Payer: Self-pay | Admitting: Emergency Medicine

## 2017-02-22 ENCOUNTER — Emergency Department: Payer: Medicare HMO

## 2017-02-22 DIAGNOSIS — Z79899 Other long term (current) drug therapy: Secondary | ICD-10-CM | POA: Diagnosis not present

## 2017-02-22 DIAGNOSIS — Z7951 Long term (current) use of inhaled steroids: Secondary | ICD-10-CM | POA: Diagnosis not present

## 2017-02-22 DIAGNOSIS — E871 Hypo-osmolality and hyponatremia: Secondary | ICD-10-CM | POA: Diagnosis present

## 2017-02-22 DIAGNOSIS — F3162 Bipolar disorder, current episode mixed, moderate: Secondary | ICD-10-CM | POA: Diagnosis present

## 2017-02-22 DIAGNOSIS — J449 Chronic obstructive pulmonary disease, unspecified: Secondary | ICD-10-CM | POA: Diagnosis present

## 2017-02-22 DIAGNOSIS — I5032 Chronic diastolic (congestive) heart failure: Secondary | ICD-10-CM | POA: Diagnosis present

## 2017-02-22 DIAGNOSIS — R197 Diarrhea, unspecified: Secondary | ICD-10-CM

## 2017-02-22 DIAGNOSIS — Z880 Allergy status to penicillin: Secondary | ICD-10-CM | POA: Diagnosis not present

## 2017-02-22 DIAGNOSIS — R000 Tachycardia, unspecified: Secondary | ICD-10-CM

## 2017-02-22 DIAGNOSIS — E872 Acidosis: Secondary | ICD-10-CM | POA: Diagnosis present

## 2017-02-22 DIAGNOSIS — Z885 Allergy status to narcotic agent status: Secondary | ICD-10-CM | POA: Diagnosis not present

## 2017-02-22 DIAGNOSIS — Z9981 Dependence on supplemental oxygen: Secondary | ICD-10-CM | POA: Diagnosis not present

## 2017-02-22 DIAGNOSIS — A419 Sepsis, unspecified organism: Secondary | ICD-10-CM | POA: Diagnosis not present

## 2017-02-22 DIAGNOSIS — K922 Gastrointestinal hemorrhage, unspecified: Secondary | ICD-10-CM

## 2017-02-22 DIAGNOSIS — Z87891 Personal history of nicotine dependence: Secondary | ICD-10-CM | POA: Diagnosis not present

## 2017-02-22 DIAGNOSIS — Z8249 Family history of ischemic heart disease and other diseases of the circulatory system: Secondary | ICD-10-CM

## 2017-02-22 DIAGNOSIS — N179 Acute kidney failure, unspecified: Secondary | ICD-10-CM | POA: Diagnosis present

## 2017-02-22 DIAGNOSIS — Z794 Long term (current) use of insulin: Secondary | ICD-10-CM

## 2017-02-22 DIAGNOSIS — Z825 Family history of asthma and other chronic lower respiratory diseases: Secondary | ICD-10-CM

## 2017-02-22 DIAGNOSIS — Z5320 Procedure and treatment not carried out because of patient's decision for unspecified reasons: Secondary | ICD-10-CM | POA: Diagnosis present

## 2017-02-22 DIAGNOSIS — I2510 Atherosclerotic heart disease of native coronary artery without angina pectoris: Secondary | ICD-10-CM | POA: Diagnosis present

## 2017-02-22 DIAGNOSIS — I110 Hypertensive heart disease with heart failure: Secondary | ICD-10-CM | POA: Diagnosis present

## 2017-02-22 DIAGNOSIS — D500 Iron deficiency anemia secondary to blood loss (chronic): Secondary | ICD-10-CM | POA: Diagnosis present

## 2017-02-22 DIAGNOSIS — A084 Viral intestinal infection, unspecified: Secondary | ICD-10-CM | POA: Diagnosis present

## 2017-02-22 DIAGNOSIS — K219 Gastro-esophageal reflux disease without esophagitis: Secondary | ICD-10-CM | POA: Diagnosis present

## 2017-02-22 DIAGNOSIS — Z881 Allergy status to other antibiotic agents status: Secondary | ICD-10-CM

## 2017-02-22 DIAGNOSIS — D638 Anemia in other chronic diseases classified elsewhere: Secondary | ICD-10-CM | POA: Diagnosis present

## 2017-02-22 DIAGNOSIS — Z833 Family history of diabetes mellitus: Secondary | ICD-10-CM

## 2017-02-22 DIAGNOSIS — F419 Anxiety disorder, unspecified: Secondary | ICD-10-CM | POA: Diagnosis present

## 2017-02-22 DIAGNOSIS — Z951 Presence of aortocoronary bypass graft: Secondary | ICD-10-CM

## 2017-02-22 LAB — COMPREHENSIVE METABOLIC PANEL
ALK PHOS: 133 U/L — AB (ref 38–126)
ALT: 14 U/L — ABNORMAL LOW (ref 17–63)
ANION GAP: 10 (ref 5–15)
AST: 28 U/L (ref 15–41)
Albumin: 3.7 g/dL (ref 3.5–5.0)
BILIRUBIN TOTAL: 0.5 mg/dL (ref 0.3–1.2)
BUN: 28 mg/dL — ABNORMAL HIGH (ref 6–20)
CALCIUM: 8.2 mg/dL — AB (ref 8.9–10.3)
CO2: 28 mmol/L (ref 22–32)
Chloride: 94 mmol/L — ABNORMAL LOW (ref 101–111)
Creatinine, Ser: 1.34 mg/dL — ABNORMAL HIGH (ref 0.61–1.24)
GFR, EST AFRICAN AMERICAN: 58 mL/min — AB (ref >60)
GFR, EST NON AFRICAN AMERICAN: 50 mL/min — AB (ref >60)
GLUCOSE: 247 mg/dL — AB (ref 65–99)
Potassium: 4.2 mmol/L (ref 3.5–5.1)
Sodium: 132 mmol/L — ABNORMAL LOW (ref 135–145)
TOTAL PROTEIN: 7.3 g/dL (ref 6.5–8.1)

## 2017-02-22 LAB — TROPONIN I

## 2017-02-22 LAB — CBC
HCT: 28.4 % — ABNORMAL LOW (ref 40.0–52.0)
HEMOGLOBIN: 9.2 g/dL — AB (ref 13.0–18.0)
MCH: 26.9 pg (ref 26.0–34.0)
MCHC: 32.4 g/dL (ref 32.0–36.0)
MCV: 82.9 fL (ref 80.0–100.0)
Platelets: 176 10*3/uL (ref 150–440)
RBC: 3.42 MIL/uL — AB (ref 4.40–5.90)
RDW: 17.9 % — ABNORMAL HIGH (ref 11.5–14.5)
WBC: 11.4 10*3/uL — AB (ref 3.8–10.6)

## 2017-02-22 LAB — LIPASE, BLOOD: Lipase: 25 U/L (ref 11–51)

## 2017-02-22 LAB — GLUCOSE, CAPILLARY
GLUCOSE-CAPILLARY: 202 mg/dL — AB (ref 65–99)
GLUCOSE-CAPILLARY: 213 mg/dL — AB (ref 65–99)

## 2017-02-22 LAB — PREPARE RBC (CROSSMATCH)

## 2017-02-22 MED ORDER — CITALOPRAM HYDROBROMIDE 20 MG PO TABS
40.0000 mg | ORAL_TABLET | Freq: Every day | ORAL | Status: DC
  Administered 2017-02-22 – 2017-02-24 (×3): 40 mg via ORAL
  Filled 2017-02-22 (×3): qty 2

## 2017-02-22 MED ORDER — TIOTROPIUM BROMIDE MONOHYDRATE 18 MCG IN CAPS
18.0000 ug | ORAL_CAPSULE | Freq: Every day | RESPIRATORY_TRACT | Status: DC
  Administered 2017-02-22 – 2017-02-24 (×2): 18 ug via RESPIRATORY_TRACT
  Filled 2017-02-22 (×2): qty 5

## 2017-02-22 MED ORDER — TRAMADOL HCL 50 MG PO TABS
50.0000 mg | ORAL_TABLET | Freq: Four times a day (QID) | ORAL | Status: DC | PRN
  Administered 2017-02-23: 50 mg via ORAL
  Filled 2017-02-22: qty 1

## 2017-02-22 MED ORDER — SODIUM CHLORIDE 0.9 % IV BOLUS (SEPSIS)
1000.0000 mL | Freq: Once | INTRAVENOUS | Status: AC
  Administered 2017-02-22: 1000 mL via INTRAVENOUS

## 2017-02-22 MED ORDER — METOPROLOL SUCCINATE ER 50 MG PO TB24
25.0000 mg | ORAL_TABLET | Freq: Every day | ORAL | Status: DC
  Administered 2017-02-22 – 2017-02-23 (×2): 25 mg via ORAL
  Filled 2017-02-22 (×2): qty 1

## 2017-02-22 MED ORDER — SODIUM CHLORIDE 0.9 % IV SOLN
INTRAVENOUS | Status: DC
  Administered 2017-02-22 – 2017-02-23 (×3): via INTRAVENOUS

## 2017-02-22 MED ORDER — METFORMIN HCL 500 MG PO TABS
500.0000 mg | ORAL_TABLET | Freq: Two times a day (BID) | ORAL | Status: DC
  Administered 2017-02-22 – 2017-02-23 (×2): 500 mg via ORAL
  Filled 2017-02-22 (×2): qty 1

## 2017-02-22 MED ORDER — ZOLPIDEM TARTRATE 5 MG PO TABS
5.0000 mg | ORAL_TABLET | Freq: Every evening | ORAL | Status: DC | PRN
  Administered 2017-02-23 (×2): 5 mg via ORAL
  Filled 2017-02-22 (×2): qty 1

## 2017-02-22 MED ORDER — INSULIN GLARGINE 100 UNIT/ML ~~LOC~~ SOLN
17.0000 [IU] | Freq: Two times a day (BID) | SUBCUTANEOUS | Status: DC
  Administered 2017-02-22 – 2017-02-23 (×2): 17 [IU] via SUBCUTANEOUS
  Filled 2017-02-22 (×3): qty 0.17

## 2017-02-22 MED ORDER — PANTOPRAZOLE SODIUM 40 MG PO TBEC
40.0000 mg | DELAYED_RELEASE_TABLET | Freq: Two times a day (BID) | ORAL | Status: DC
  Administered 2017-02-22 – 2017-02-24 (×4): 40 mg via ORAL
  Filled 2017-02-22 (×4): qty 1

## 2017-02-22 MED ORDER — ONDANSETRON HCL 4 MG/2ML IJ SOLN: 4.0000 mg | Freq: Four times a day (QID) | INTRAMUSCULAR | Status: DC | PRN

## 2017-02-22 MED ORDER — GABAPENTIN 300 MG PO CAPS
300.0000 mg | ORAL_CAPSULE | Freq: Three times a day (TID) | ORAL | Status: DC
  Administered 2017-02-22 – 2017-02-24 (×6): 300 mg via ORAL
  Filled 2017-02-22 (×6): qty 1

## 2017-02-22 MED ORDER — ALPRAZOLAM 0.5 MG PO TABS
0.5000 mg | ORAL_TABLET | Freq: Three times a day (TID) | ORAL | Status: DC | PRN
  Administered 2017-02-22 – 2017-02-23 (×2): 0.5 mg via ORAL
  Filled 2017-02-22 (×2): qty 1

## 2017-02-22 MED ORDER — ATORVASTATIN CALCIUM 20 MG PO TABS
40.0000 mg | ORAL_TABLET | Freq: Every day | ORAL | Status: DC
  Administered 2017-02-22 – 2017-02-23 (×2): 40 mg via ORAL
  Filled 2017-02-22 (×2): qty 2

## 2017-02-22 MED ORDER — VITAMIN D 1000 UNITS PO TABS
1000.0000 [IU] | ORAL_TABLET | Freq: Every day | ORAL | Status: DC
  Administered 2017-02-22 – 2017-02-24 (×3): 1000 [IU] via ORAL
  Filled 2017-02-22 (×3): qty 1

## 2017-02-22 MED ORDER — MOMETASONE FURO-FORMOTEROL FUM 200-5 MCG/ACT IN AERO
2.0000 | INHALATION_SPRAY | Freq: Two times a day (BID) | RESPIRATORY_TRACT | Status: DC
  Administered 2017-02-22 – 2017-02-24 (×4): 2 via RESPIRATORY_TRACT
  Filled 2017-02-22: qty 8.8

## 2017-02-22 MED ORDER — FERROUS SULFATE 325 (65 FE) MG PO TABS
325.0000 mg | ORAL_TABLET | Freq: Three times a day (TID) | ORAL | Status: DC
  Administered 2017-02-22 – 2017-02-24 (×6): 325 mg via ORAL
  Filled 2017-02-22 (×6): qty 1

## 2017-02-22 MED ORDER — SUCRALFATE 1 G PO TABS
1.0000 g | ORAL_TABLET | Freq: Three times a day (TID) | ORAL | Status: DC
  Administered 2017-02-22 – 2017-02-24 (×7): 1 g via ORAL
  Filled 2017-02-22 (×8): qty 1

## 2017-02-22 MED ORDER — ACETAMINOPHEN 325 MG PO TABS
650.0000 mg | ORAL_TABLET | Freq: Four times a day (QID) | ORAL | Status: DC | PRN
  Administered 2017-02-23: 650 mg via ORAL
  Filled 2017-02-22: qty 2

## 2017-02-22 MED ORDER — SODIUM CHLORIDE 0.9 % IV SOLN: 10.0000 mL/h | Freq: Once | INTRAVENOUS | Status: DC

## 2017-02-22 MED ORDER — WITCH HAZEL-GLYCERIN EX PADS
MEDICATED_PAD | CUTANEOUS | Status: DC | PRN
  Administered 2017-02-22: via TOPICAL
  Filled 2017-02-22: qty 100

## 2017-02-22 MED ORDER — TRAZODONE HCL 50 MG PO TABS
50.0000 mg | ORAL_TABLET | Freq: Every day | ORAL | Status: DC
  Administered 2017-02-22 – 2017-02-23 (×2): 50 mg via ORAL
  Filled 2017-02-22 (×2): qty 1

## 2017-02-22 MED ORDER — ONDANSETRON HCL 4 MG PO TABS: 4.0000 mg | ORAL_TABLET | Freq: Four times a day (QID) | ORAL | Status: DC | PRN

## 2017-02-22 MED ORDER — ALBUTEROL SULFATE (2.5 MG/3ML) 0.083% IN NEBU: 2.5000 mg | INHALATION_SOLUTION | Freq: Four times a day (QID) | RESPIRATORY_TRACT | Status: DC | PRN

## 2017-02-22 MED ORDER — ACETAMINOPHEN 650 MG RE SUPP: 650.0000 mg | Freq: Four times a day (QID) | RECTAL | Status: DC | PRN

## 2017-02-22 MED ORDER — ACETAMINOPHEN 325 MG PO TABS: 650.0000 mg | ORAL_TABLET | Freq: Four times a day (QID) | ORAL | Status: DC | PRN

## 2017-02-22 NOTE — ED Notes (Signed)
Patient given ice chips per MD 

## 2017-02-22 NOTE — ED Notes (Signed)
Spoke with patient's wife to give update and advise of admission. [name] [phone]
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
## 2017-02-22 NOTE — ED Triage Notes (Signed)
Patient from home via [facility]. Reports diarrhea and increased weakness x2 days. Patient reports family has had diarrhea for the past week. Denies fever at home. Patient denies N/V. Patient reports generalized pain. A&O x4 upon arrival.
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
## 2017-02-23 LAB — C DIFFICILE QUICK SCREEN W PCR REFLEX
C DIFFICILE (CDIFF) TOXIN: NEGATIVE
C DIFFICLE (CDIFF) ANTIGEN: NEGATIVE
C Diff interpretation: NOT DETECTED

## 2017-02-23 LAB — GASTROINTESTINAL PANEL BY PCR, STOOL (REPLACES STOOL CULTURE)
ADENOVIRUS F40/41: NOT DETECTED
Astrovirus: NOT DETECTED
CAMPYLOBACTER SPECIES: NOT DETECTED
Cryptosporidium: NOT DETECTED
Cyclospora cayetanensis: NOT DETECTED
ENTEROAGGREGATIVE E COLI (EAEC): NOT DETECTED
ENTEROPATHOGENIC E COLI (EPEC): NOT DETECTED
Entamoeba histolytica: NOT DETECTED
Enterotoxigenic E coli (ETEC): NOT DETECTED
GIARDIA LAMBLIA: NOT DETECTED
NOROVIRUS GI/GII: NOT DETECTED
Plesimonas shigelloides: NOT DETECTED
ROTAVIRUS A: DETECTED — AB
SALMONELLA SPECIES: NOT DETECTED
SHIGELLA/ENTEROINVASIVE E COLI (EIEC): NOT DETECTED
Sapovirus (I, II, IV, and V): NOT DETECTED
Shiga like toxin producing E coli (STEC): NOT DETECTED
Vibrio cholerae: NOT DETECTED
Vibrio species: NOT DETECTED
Yersinia enterocolitica: NOT DETECTED

## 2017-02-23 LAB — LACTIC ACID, PLASMA
Lactic Acid, Venous: 2 mmol/L (ref 0.5–1.9)
Lactic Acid, Venous: 2.1 mmol/L (ref 0.5–1.9)

## 2017-02-23 LAB — CBC
HCT: 29.1 % — ABNORMAL LOW (ref 40.0–52.0)
HEMOGLOBIN: 9.4 g/dL — AB (ref 13.0–18.0)
MCH: 26.9 pg (ref 26.0–34.0)
MCHC: 32.5 g/dL (ref 32.0–36.0)
MCV: 82.7 fL (ref 80.0–100.0)
PLATELETS: 163 10*3/uL (ref 150–440)
RBC: 3.52 MIL/uL — AB (ref 4.40–5.90)
RDW: 17.9 % — ABNORMAL HIGH (ref 11.5–14.5)
WBC: 8.6 10*3/uL (ref 3.8–10.6)

## 2017-02-23 LAB — URINALYSIS, COMPLETE (UACMP) WITH MICROSCOPIC
Bacteria, UA: NONE SEEN
Bilirubin Urine: NEGATIVE
GLUCOSE, UA: NEGATIVE mg/dL
Hgb urine dipstick: NEGATIVE
Ketones, ur: NEGATIVE mg/dL
Leukocytes, UA: NEGATIVE
Nitrite: NEGATIVE
PH: 5 (ref 5.0–8.0)
Protein, ur: 30 mg/dL — AB
SPECIFIC GRAVITY, URINE: 1.017 (ref 1.005–1.030)

## 2017-02-23 LAB — BASIC METABOLIC PANEL
ANION GAP: 10 (ref 5–15)
BUN: 24 mg/dL — ABNORMAL HIGH (ref 6–20)
CALCIUM: 8.5 mg/dL — AB (ref 8.9–10.3)
CHLORIDE: 98 mmol/L — AB (ref 101–111)
CO2: 27 mmol/L (ref 22–32)
Creatinine, Ser: 1.29 mg/dL — ABNORMAL HIGH (ref 0.61–1.24)
GFR calc non Af Amer: 53 mL/min — ABNORMAL LOW (ref >60)
Glucose, Bld: 195 mg/dL — ABNORMAL HIGH (ref 65–99)
Potassium: 3.9 mmol/L (ref 3.5–5.1)
SODIUM: 135 mmol/L (ref 135–145)

## 2017-02-23 LAB — PROTIME-INR
INR: 1.16
PROTHROMBIN TIME: 14.9 s (ref 11.4–15.2)

## 2017-02-23 LAB — PROCALCITONIN: Procalcitonin: <0.1 ng/mL

## 2017-02-23 LAB — APTT: APTT: 25 s (ref 24–36)

## 2017-02-23 LAB — GLUCOSE, CAPILLARY
GLUCOSE-CAPILLARY: 209 mg/dL — AB (ref 65–99)
GLUCOSE-CAPILLARY: 171 mg/dL — AB (ref 65–99)
GLUCOSE-CAPILLARY: 148 mg/dL — AB (ref 65–99)

## 2017-02-23 MED ORDER — VANCOMYCIN HCL IN DEXTROSE 1-5 GM/200ML-% IV SOLN
1000.0000 mg | Freq: Once | INTRAVENOUS | Status: AC
  Administered 2017-02-23: 1000 mg via INTRAVENOUS
  Filled 2017-02-23: qty 200

## 2017-02-23 MED ORDER — DEXTROSE 5 % IV SOLN
2.0000 g | Freq: Three times a day (TID) | INTRAVENOUS | Status: DC
  Filled 2017-02-23: qty 2

## 2017-02-23 MED ORDER — METOPROLOL TARTRATE 25 MG PO TABS
25.0000 mg | ORAL_TABLET | Freq: Two times a day (BID) | ORAL | Status: DC
  Administered 2017-02-23 – 2017-02-24 (×2): 25 mg via ORAL
  Filled 2017-02-23 (×2): qty 1

## 2017-02-23 MED ORDER — INSULIN GLARGINE 100 UNIT/ML ~~LOC~~ SOLN
20.0000 [IU] | Freq: Two times a day (BID) | SUBCUTANEOUS | Status: DC
  Administered 2017-02-23 – 2017-02-24 (×2): 20 [IU] via SUBCUTANEOUS
  Filled 2017-02-23 (×3): qty 0.2

## 2017-02-23 MED ORDER — INSULIN ASPART 100 UNIT/ML ~~LOC~~ SOLN: 0.0000 [IU] | Freq: Every day | SUBCUTANEOUS | Status: DC

## 2017-02-23 MED ORDER — TRAMADOL HCL 50 MG PO TABS: 50.0000 mg | ORAL_TABLET | Freq: Four times a day (QID) | ORAL | Status: DC | PRN

## 2017-02-23 MED ORDER — SODIUM CHLORIDE 0.9 % IV SOLN
1250.0000 mg | Freq: Two times a day (BID) | INTRAVENOUS | Status: DC
  Filled 2017-02-23: qty 1250

## 2017-02-23 MED ORDER — INSULIN ASPART 100 UNIT/ML ~~LOC~~ SOLN
0.0000 [IU] | Freq: Three times a day (TID) | SUBCUTANEOUS | Status: DC
  Administered 2017-02-23: 2 [IU] via SUBCUTANEOUS
  Administered 2017-02-23 – 2017-02-24 (×2): 3 [IU] via SUBCUTANEOUS
  Administered 2017-02-24: 2 [IU] via SUBCUTANEOUS
  Filled 2017-02-23 (×2): qty 2
  Filled 2017-02-23 (×2): qty 3

## 2017-02-23 MED ORDER — DIPHENOXYLATE-ATROPINE 2.5-0.025 MG PO TABS
1.0000 | ORAL_TABLET | Freq: Four times a day (QID) | ORAL | Status: DC | PRN
  Administered 2017-02-23: 1 via ORAL
  Filled 2017-02-23: qty 1

## 2017-02-23 MED ORDER — DEXTROSE 5 % IV SOLN
2.0000 g | Freq: Once | INTRAVENOUS | Status: AC
  Administered 2017-02-23: 2 g via INTRAVENOUS
  Filled 2017-02-23: qty 2

## 2017-02-23 NOTE — Progress Notes (Signed)
Notified MD of pt takes Ambien at nighttime. Pt also wanted medication for diarrhea. Orders placed. Will continue to monitor and assess.

## 2017-02-23 NOTE — Progress Notes (Signed)
No longer "seeing things"

## 2017-02-23 NOTE — Progress Notes (Signed)
Inpatient Diabetes Program Recommendations  AACE/ADA: New Consensus Statement on Inpatient Glycemic Control (2015)  Target Ranges:  Prepandial:   less than 140 mg/dL      Peak postprandial:   less than 180 mg/dL (1-2 hours)      Critically ill patients:  140 - 180 mg/dL   Lab Results  Component Value Date   GLUCAP 209 (H) 02/23/2017   HGBA1C 7.0 (H) 07/24/2016    Review of Glycemic Control  Results for CHANCELLOR, VANDERLOOP (MRN [id number]) as of 02/23/2017 12:27  Ref. Range 02/22/2017 16:17 02/22/2017 21:05 02/23/2017 11:56  Glucose-Capillary Latest Ref Range: 65 - 99 mg/dL 202 (H) 213 (H) 209 (H)    Diabetes history: Type 2 Outpatient Diabetes medications:  Novolog 10 units tid, Lantus 17 units bid, Metformin 500mg  bid  Current orders for Inpatient glycemic control:  Lantus 17 units bid, Novolog 0-9 units tid, Novolog 0-5 units qhs  Inpatient Diabetes Program Recommendations:  Per ADA recommendations "consider performing an A1C on all patients with diabetes or hyperglycemia admitted to the hospital if not performed in the prior 3 months".  Gentry Fitz, RN, BA, MHA, CDE Diabetes Coordinator Inpatient Diabetes Program  [phone] (Team Pager) [phone] ([place]) 02/23/2017 12:39 PM

## 2017-02-23 NOTE — Progress Notes (Signed)
Oral temp 103. Tylenol given. HR 129,  RR 30.  States he has chest pain of  9 or 10 out of 10.   Reports he has this pain "all the time".  After several explanations of what a pain goal is, he states his pain goal is 10 or 11.  ' I feel best when it is 11".  I suspect this response is secondary to the fever.  Dr. Bridgett Larsson notified. Blood cultures ordered.

## 2017-02-23 NOTE — Progress Notes (Signed)
CRITICAL VALUE ALERT  Critical value received:  Lactic acid = 2.0  Date of notification: 5/10  Time of notification:  1203  Critical value read back:yes  Nurse who received alert:  Arletta Bale MD notified (1st page):  Dr. Bridgett Larsson  Time of first page:  12:04 MD notified (2nd page):  Time of second page:  Responding MD:  Dr. Bridgett Larsson  Time MD responded:  1210

## 2017-02-23 NOTE — Progress Notes (Signed)
Patient states he is seeing "things", cartoon characters and stick people on the ceiling.  He is aware that they aren't really there.  Discussed with Dr. Bridgett Larsson.  Likely secondary to sepsis and elevated lactic acid.  Will monitor

## 2017-02-23 NOTE — Progress Notes (Signed)
Pt has had multiple complaints this evening. At times uncooperative with staff,and using vulgar language towards staff.

## 2017-02-23 NOTE — Progress Notes (Signed)
PT Cancellation Note  Patient Details Name: [name] MRN: [id number] DOB: 12/23/1940   Cancelled Treatment:    Reason Eval/Treat Not Completed: Patient not medically ready.  PT consult received.  Pt's chart reviewed.  Discussed pt with pt's primary nurse who recommended holding PT today d/t pt's current medical condition.  Will re-attempt PT eval tomorrow as medically appropriate.  Leitha Bleak, PT 02/23/17, 2:35 PM [phone]

## 2017-02-23 NOTE — Progress Notes (Addendum)
Pharmacy Antibiotic Note  [name] is a 76 y.o. male admitted on 02/22/2017 with Acute renal failure and acute gastroenteritis. Pharmacy now consulted for Aztreonam and Vancomycin dosing for sepsis due to patient having fever and tachycardia.    Gastrointestinal Panel by PCR , Stool [[id number]] (Abnormal) Rotavirus A DETECTED (A)    Plan: Ke: 0.059   t1/2: 11.75   Vd: 70   Patient received vancomycin 1gm Iv x 1 dose. Will start patient on vancomycin 1250mg  IV every 12 hours with 6 hour stack dosing. Trough ordered prior to 5th dose. Calculated trough at Css is 17.  Will start patient on Aztreonam 2gm IV every 8 hours.   RECOMMENDATION: recommend discontinuing antibiotics due to patient being positive for Rotavirus and  procalcitonin <0.10.    Height: 6\' 2"  (188 cm) Weight: 256 lb (116.1 kg) IBW/kg (Calculated) : 82.2  Temp (24hrs), Avg:99.8 F (37.7 C), Min:98.3 F (36.8 C), Max:103 F (39.4 C)   Recent Labs Lab 02/22/17 1216 02/23/17 0827 02/23/17 1116  WBC 11.4* 8.6  --   CREATININE 1.34* 1.29*  --   LATICACIDVEN  --   --  2.0*    Estimated Creatinine Clearance: 67 mL/min (A) (by C-G formula based on SCr of 1.29 mg/dL (H)).    Allergies  Allergen Reactions  . Morphine And Related Shortness Of Breath, Nausea And Vomiting, Rash and Other (See Comments)    Reaction:  Hallucinations   . Penicillins Anaphylaxis, Hives and Other (See Comments)    Has patient had a PCN reaction causing immediate rash, facial/tongue/throat swelling, SOB or lightheadedness with hypotension: Yes Has patient had a PCN reaction causing severe rash involving mucus membranes or skin necrosis: No Has patient had a PCN reaction that required hospitalization No Has patient had a PCN reaction occurring within the last 10 years: No If all of the above answers are "NO", then may proceed with Cephalosporin use.  . Demerol [Meperidine] Other (See Comments)    Reaction:  Hallucinations    .  Dilaudid [Hydromorphone Hcl] Other (See Comments)    Reaction:  Hallucinations   . Levofloxacin Other (See Comments)    Reaction:  Unknown     Antimicrobials this admission: 5/10 aztreonam >>  5/10 vancomycin >>  Dose adjustments this admission:  Microbiology results: 5/10  BCx: sent 5/9 C. Diff neg   Gastrointestinal Panel by PCR , Stool [[id number]] (Abnormal) Collected: 02/22/17 2352  Order Status: Completed Specimen: Stool Updated: 02/23/17 0224   Campylobacter species NOT DETECTED   Plesimonas shigelloides NOT DETECTED   Salmonella species NOT DETECTED   Yersinia enterocolitica NOT DETECTED   Vibrio species NOT DETECTED   Vibrio cholerae NOT DETECTED   Enteroaggregative E coli (EAEC) NOT DETECTED   Enteropathogenic E coli (EPEC) NOT DETECTED   Enterotoxigenic E coli (ETEC) NOT DETECTED   Shiga like toxin producing E coli (STEC) NOT DETECTED   Shigella/Enteroinvasive E coli (EIEC) NOT DETECTED   Cryptosporidium NOT DETECTED   Cyclospora cayetanensis NOT DETECTED   Entamoeba histolytica NOT DETECTED   Giardia lamblia NOT DETECTED   Adenovirus F40/41 NOT DETECTED   Astrovirus NOT DETECTED   Norovirus GI/GII NOT DETECTED   Rotavirus A DETECTED (A)   Sapovirus (I, II, IV, and V) NOT DETECTED     Thank you for allowing pharmacy to be a part of this patient's care.  Pernell Dupre, PharmD, BCPS Clinical Pharmacist 02/23/2017 1:37 PM

## 2017-02-23 NOTE — Progress Notes (Signed)
Initial Nutrition Assessment  DOCUMENTATION CODES:   Obesity unspecified  INTERVENTION:   Magic cup TID with meals, each supplement provides 290 kcal and 9 grams of protein  NUTRITION DIAGNOSIS:   Inadequate oral intake related to acute illness, gastroenteritis as evidenced by per patient/family report.  GOAL:   Patient will meet greater than or equal to 90% of their needs  MONITOR:   PO intake, Supplement acceptance, Labs, Weight trends  REASON FOR ASSESSMENT:   Malnutrition Screening Tool    ASSESSMENT:   76 y.o. 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   Met with pt in room today. Pt seems slightly confused today. Pt reports poor appetite and oral intake for four days pta. Pt currently on a full liquid diet. Pt had not touched his breakfast tray at time of RD visit but lab was present trying to draw blood samples. Per chart, pt has lost 14lbs(5%) in 5 months. This is not significant. Pt reports continued diarrhea today. Pt does not like supplements; RD will order Magic Cups. Continue to encourage intake of meals.   Medications reviewed and include: Vit D, celexa, ferrous sulfate, insulin, metformin, protonix, sucralfate, tramadol  Labs reviewed: Cl 98(L), BUN 24(H), creat 1.29(H), Ca 8.5(L) Hgb 9.4(L), Hct 29.1(L) cbgs- 247, 195 x 24hrs AIC 7.0(H) 07/2016  Nutrition-Focused physical exam completed. Findings are no fat depletion, no muscle depletion, and no edema.   Diet Order:  Diet full liquid Room service appropriate? Yes; Fluid consistency: Thin  Skin:  Reviewed, no issues  Last BM:  5/10- diarrhea  Height:   Ht Readings from Last 1 Encounters:  02/22/17 6' 2"  (1.88 m)    Weight:   Wt Readings from Last 1 Encounters:  02/22/17  256 lb (116.1 kg)    Ideal Body Weight:  86.3 kg  BMI:  Body mass index is 32.87 kg/m.  Estimated Nutritional Needs:   Kcal:  2300-2700kcal/day   Protein:  93-128g/day   Fluid:  >2L/day   EDUCATION NEEDS:   No education needs identified at this time  Koleen Distance MS, RD, LDN Pager #- [phone]

## 2017-02-23 NOTE — Progress Notes (Addendum)
[facility] [place] NAME: Salathiel Ferrara    MR#:  [id number]  DATE OF BIRTH:  May 29, 1941  SUBJECTIVE:  CHIEF COMPLAINT:   Chief Complaint  Patient presents with  . Diarrhea  . Weakness   Fever 103 this morning. Still has diarrhea and generalized weakness. REVIEW OF SYSTEMS:  Review of Systems  Constitutional: Positive for chills, fever and malaise/fatigue.  HENT: Negative for congestion and sore throat.   Eyes: Negative for blurred vision and double vision.  Respiratory: Negative for cough, shortness of breath, wheezing and stridor.   Cardiovascular: Negative for chest pain and leg swelling.  Gastrointestinal: Positive for diarrhea and nausea. Negative for abdominal pain, blood in stool, melena and vomiting.  Genitourinary: Negative for dysuria and hematuria.  Musculoskeletal: Negative for back pain.  Skin: Negative for itching and rash.  Neurological: Positive for weakness. Negative for dizziness, focal weakness and loss of consciousness.  Psychiatric/Behavioral: Negative for depression. The patient is not nervous/anxious.     DRUG ALLERGIES:   Allergies  Allergen Reactions  . Morphine And Related Shortness Of Breath, Nausea And Vomiting, Rash and Other (See Comments)    Reaction:  Hallucinations   . Penicillins Anaphylaxis, Hives and Other (See Comments)    Has patient had a PCN reaction causing immediate rash, facial/tongue/throat swelling, SOB or lightheadedness with hypotension: Yes Has patient had a PCN reaction causing severe rash involving mucus membranes or skin necrosis: No Has patient had a PCN reaction that required hospitalization No Has patient had a PCN reaction occurring within the last 10 years: No If all of the above answers are "NO", then may proceed with Cephalosporin use.  . Demerol [Meperidine] Other (See Comments)    Reaction:  Hallucinations    . Dilaudid [Hydromorphone Hcl] Other (See Comments)   Reaction:  Hallucinations   . Levofloxacin Other (See Comments)    Reaction:  Unknown    VITALS:  Blood pressure 119/76, pulse (!) 126, temperature 98.3 F (36.8 C), resp. rate 20, height 6\' 2"  (1.88 m), weight 256 lb (116.1 kg), SpO2 92 %. PHYSICAL EXAMINATION:  Physical Exam  Constitutional: He is oriented to person, place, and time and well-developed, well-nourished, and in no distress.  Obese  HENT:  Head: Normocephalic.  Mouth/Throat: Oropharynx is clear and moist.  Eyes: Conjunctivae and EOM are normal. No scleral icterus.  Neck: Normal range of motion. Neck supple. No JVD present. No tracheal deviation present.  Cardiovascular: Normal rate, regular rhythm and normal heart sounds.  Exam reveals no gallop.   No murmur heard. Pulmonary/Chest: Effort normal and breath sounds normal. No respiratory distress. He has no wheezes. He has no rales.  Abdominal: Soft. Bowel sounds are normal. He exhibits no distension. There is no tenderness. There is no rebound.  Musculoskeletal: Normal range of motion. He exhibits no edema or tenderness.  Neurological: He is alert and oriented to person, place, and time. No cranial nerve deficit.  Skin: No rash noted. No erythema.  Psychiatric: Affect normal.   LABORATORY PANEL:  Male CBC  Recent Labs Lab 02/23/17 0827  WBC 8.6  HGB 9.4*  HCT 29.1*  PLT 163   ------------------------------------------------------------------------------------------------------------------ Chemistries   Recent Labs Lab 02/22/17 1216 02/23/17 0827  NA 132* 135  K 4.2 3.9  CL 94* 98*  CO2 28 27  GLUCOSE 247* 195*  BUN 28* 24*  CREATININE 1.34* 1.29*  CALCIUM 8.2* 8.5*  AST 28  --   ALT 14*  --  ALKPHOS 133*  --   BILITOT 0.5  --    RADIOLOGY:  No results found. ASSESSMENT AND PLAN:   Dave Mergen  is a 76 y.o. male with a known history ofChronic anemia secondary to GI bleed from AVMs with multiple blood transfusions in the past, CAD, chronic  anxiety, bipolar disorder comes to the emergency room with complaints of increasing diarrhea for 2-3 days along with generalized weakness. Patient Costella Hatcher folks in his house including his wife and roommate are having similar symptoms.  1. Acute viral gastroenteritis,  -Full liquid diet -Follow up C. difficile and GI PCR panel: Rotavirus positive. -IV fluids for hydration  * Sepsis due to above. Started sepsis protocol. He was given vancomycin and I thanked him this morning. Pro-calcitonin is 0.1, GI panel show rotavirus. Discontinue antibiotics. Follow-up blood culture.  * Lactic acidosis. Continue treatment as above, follow-up lactic acid.  2. Acute renal failure/acute hyponatremia -Baseline creatinine 1.1 -Creatinine today 1.34 Improving with IV fluids Avoid nephrotoxins  Hyponatremia. Improved.  3. Chronic anemia secondary to multiple GI bleed from AVMs. Patient has multiple admissions in the past for blood transfusions -His hemoglobin today's 9.2 -Transfuse as needed -He is not actively bleeding, hemoglobin is stable.  4. Hypertension continue home meds  5. COPD on chronic home oxygen, continuous -Patient is stable continue when necessary nebs treatment and inhalers  6. Physical therapy -Patient is not active at home.  Diabetes. Continue Lantus and sliding scale.  All the records are reviewed and case discussed with Care Management/Social Worker. Management plans discussed with the patient, family and they are in agreement.  CODE STATUS: Full Code  TOTAL TIME TAKING CARE OF THIS PATIENT: 43 minutes.   More than 50% of the time was spent in counseling/coordination of care: YES  POSSIBLE D/C IN 2-3 DAYS, DEPENDING ON CLINICAL CONDITION.   Demetrios Loll M.D on 02/23/2017 at 2:38 PM  Between 7am to 6pm - Pager - [phone]  After 6pm go to [email or web address] - Proofreader  [facility]  Office  [phone]  CC: Primary care  physician; [name], MD  Note: This dictation was prepared with Dragon dictation along with smaller phrase technology. Any transcriptional errors that result from this process are unintentional.

## 2017-02-24 LAB — HEMOGLOBIN A1C
HEMOGLOBIN A1C: 7.8 % — AB (ref 4.8–5.6)
MEAN PLASMA GLUCOSE: 177 mg/dL

## 2017-02-24 LAB — LACTIC ACID, PLASMA: LACTIC ACID, VENOUS: 1.1 mmol/L (ref 0.5–1.9)

## 2017-02-24 LAB — GLUCOSE, CAPILLARY
GLUCOSE-CAPILLARY: 154 mg/dL — AB (ref 65–99)
Glucose-Capillary: 224 mg/dL — ABNORMAL HIGH (ref 65–99)

## 2017-02-24 NOTE — Care Management (Signed)
Wife is present at bedside.  Discussed home health and patient is in agreement with nurse only and agency has to be [facility].  Obtained order and referral called.

## 2017-02-24 NOTE — Care Management Important Message (Signed)
Important Message  Patient Details  Name: [name] MRN: [id number] Date of Birth: 1941/01/01   Medicare Important Message Given:  Yes Signed IM notice given    Katrina Stack, RN 02/24/2017, 8:18 AM

## 2017-02-24 NOTE — Progress Notes (Addendum)
Pt reports that he is "feeling weak" and doesn't feel like he is ready for discharge. Wife was updated on situation. MD CHen was paged with wife number to talk to her. Pt reports stools but nurse aide reports pt is pushing to have BM. PT at bedside working with pt. No further orders at this time.

## 2017-02-24 NOTE — Progress Notes (Signed)
NT Gerald Stabs and Foreman were questioned about how many stools have occurred on this shift. They reported 3 stools.

## 2017-02-24 NOTE — Evaluation (Signed)
Physical Therapy Evaluation Patient Details Name: [name] MRN: [id number] DOB: 1941-01-24 Today's Date: 02/24/2017   History of Present Illness  Pt is a 76 y.o. male who presented to the ED with diarrhea and weakness and was admitted for acute renal failure. Pt has PMH of AAA, anxiety, arteriovenous malformation of colon with hemorrhage, Bipolar I disorder, CAD, chronic diastolic CHF, COPD, depression, diverticulosis, HTN, HLD, DMII, and low back pain.  Clinical Impression  Pt was in bed watching TV upon entry. Pt stated that he was too weak to work with physical therapy. When informed that PT was there to help him improve his strength, pt was agreeable to PT evaluation. When speaking with pt initially the pt became very agitated and used threatening language suggesting that he was "comfortable with killing." Pt responded with less agitation as long as he felt like he was being heard. Pt was uncooperative when asked about his home, stating that he "lives in a cave with bats with godzilla for a wife." Pt reported that he was too weak to cut an omlette and bring the fork to his mouth during breakfast and expressed concern about UE weakness. Pt cooperated with an UE strength assessment and tested 5/5 strength for shoulder flexion and elbow flexion/extension and demonstrated full ROM when asked to raise his arms overhead. Pt agreed to bed level exercises and required min-mod assistance with SLR x10 to each side, however when pt's socks were donned the pt lifted his L LE off the bed independently and held it for ~10 seconds, indicating that the patient was not being cooperative with the previous exercises. The pt required mod assist with rolling in bed and min assist with sitting EOB. The pt refused to stand during session and stated that "I am not unconfortable with killing" and proceded to stare down the physical therapist in response to the therapist bringing over a gait belt, then proceded to inform  therapists that he was a Mudlogger (primary nurse and Therapist, sports notified of pts behavior during session). The pt required min assist to get back in bed and to scoot up in bed. Time for PT evaluation was 64 minutes with limited patient history provided and limited functional mobility assessed d/t pt's level of participation and agitation. Pt is not appropriate for skilled PT during admission and recommend no PT follow up upon d/c d/t pt being unwilling to appropriately participate and self-limiting with therapeutic activities.      Follow Up Recommendations No PT follow up    Equipment Recommendations  None recommended by PT    Recommendations for Other Services       Precautions / Restrictions Precautions Precautions: Fall Restrictions Weight Bearing Restrictions: No      Mobility  Bed Mobility Overal bed mobility: Needs Assistance Bed Mobility: Rolling Rolling: Mod assist         General bed mobility comments: Pt rolled to each side with mod assist to allow nursing to clean him and change his brief   Transfers                 General transfer comment: Not attempted d/t pt refusal to stand  Ambulation/Gait                Stairs            Wheelchair Mobility    Modified Rankin (Stroke Patients Only)       Balance Overall balance assessment: Needs assistance Sitting-balance support: Bilateral upper extremity  supported;Feet supported Sitting balance-Leahy Scale: Good Sitting balance - Comments: Pt sat EOB for >10 min with CGA for safety                                     Pertinent Vitals/Pain Pain Assessment: No/denies pain (Pt did report intermittent chest pain for the past two hours (RN notified))    Home Living Family/patient expects to be discharged to:: Private residence Living Arrangements: Spouse/significant other;Non-relatives/Friends Available Help at Discharge: Family Type of Home: House Home Access: Stairs to  enter Entrance Stairs-Rails: Chemical engineer of Steps: 5     Additional Comments: Pt reports he walks at least 60 ft within the home at baseline, but usually only walks once a day. Pt reported that he "lives in a cave with bats with godzilla for a wife" and mentioned two friends that may also live with him     Prior Function           Comments: He takes sponge baths.  He does not use AD to ambulate but is a limited household ambulator. Uses 5L O2 at home. (Per prior PT note)     Hand Dominance        Extremity/Trunk Assessment   Upper Extremity Assessment Upper Extremity Assessment: Overall WFL for tasks assessed (5/5 B UE strength with shoulder flexion and elbow flexion/extension)    Lower Extremity Assessment Lower Extremity Assessment: Overall WFL for tasks assessed;Generalized weakness (B LE grossly at least 3/5 strength , not formally assessed d/t pt agitation; L LE sensation deficits from ankle to toes noted, pt reports this is at baseline)    Cervical / Trunk Assessment Cervical / Trunk Assessment: Normal  Communication   Communication: No difficulties  Cognition Arousal/Alertness: Awake/alert Behavior During Therapy: Agitated Overall Cognitive Status: Difficult to assess (d/t agitation)                                 General Comments:      General Comments      Exercises General Exercises - Lower Extremity Straight Leg Raises: AAROM;Strengthening;Both;10 reps;Supine   Assessment/Plan    PT Assessment Patent does not need any further PT services  PT Problem List         PT Treatment Interventions      PT Goals (Current goals can be found in the Care Plan section)  Acute Rehab PT Goals Patient Stated Goal: pt did not state any goals at this time    Frequency     Barriers to discharge        Co-evaluation               AM-PAC PT "6 Clicks" Daily Activity  Outcome Measure Difficulty turning over in bed  (including adjusting bedclothes, sheets and blankets)?: Total Difficulty moving from lying on back to sitting on the side of the bed? : Total Difficulty sitting down on and standing up from a chair with arms (e.g., wheelchair, bedside commode, etc,.)?: Total Help needed moving to and from a bed to chair (including a wheelchair)?: Total Help needed walking in hospital room?: Total Help needed climbing 3-5 steps with a railing? : Total 6 Click Score: 6    End of Session Equipment Utilized During Treatment: Oxygen (4L O2) Activity Tolerance: Treatment limited secondary to agitation Patient left: in bed;with call bell/phone within  reach;with bed alarm set Nurse Communication: Mobility status (pt behavior and refusal to participate in standing during session, pt c/o intermittent chest pain, pt requesting extra pillows)      Time: 9480-1655 PT Time Calculation (min) (ACUTE ONLY): 64 min   Charges:         PT G Codes:         Gilverto Dileonardo, SPT 02/24/2017, 1:57 PM

## 2017-02-24 NOTE — Progress Notes (Signed)
Wife updated on plan of care

## 2017-02-24 NOTE — Progress Notes (Signed)
Pt declined home health list and requested it not be given to wife. Pt then threw list across the room. Pt stated that he had 8 bowel movements, however staff reports 3 semi formed stools. Evette was made aware of patient angry with RN and staff, Evette met with patient. Pt stated he was too weak to go home and requested to be feed. Pt was able to fed self. Pt then threatened PT staff and declined to work once he was sitting on the side of the bed. Pt requested diet to be advanced MD Bridgett Larsson was made aware and advanced diet, pt tolerated it well. Wife was made aware of situation and brought personal oxygen. Discharge instructions reviewed, iv and tele removed. Pt walked to wheelchair. Pt has no further concerns at this time.

## 2017-02-24 NOTE — Care Management (Signed)
There is a consult for CM assessment for home health needs.  Patient very gruffly instructs CM to "get out of my room. I do not want to talk to you or hear anything you have got to say." have made two attempts.   Patient does have chronic home oxygen. Will try again when patient is more receptive.

## 2017-02-24 NOTE — Clinical Social Work Note (Addendum)
CSW received consult that patient's wife wanted a Education officer, museum to speak with her alone.  CSW attempted to contact her and left message on voice mail awaiting call back.  CSW left information for patient's wife in regards to ALFs, [facility], and SNFs attached to patient's discharge paperwork.  CSW signing off, please reconsult if other social work needs arise.  Jones Broom. [place], MSW, [address]  02/24/2017 1:12 PM

## 2017-02-24 NOTE — Discharge Summary (Addendum)
[facility] NAME: [name]    MR#:  [id number]  DATE OF BIRTH:  1940-11-04  DATE OF ADMISSION:  02/22/2017   ADMITTING PHYSICIAN: Fritzi Mandes, MD  DATE OF DISCHARGE: 02/24/2017 PRIMARY CARE PHYSICIAN: [name], MD   ADMISSION DIAGNOSIS:  Tachycardia [R00.0] Lower GI bleed [K92.2] Diarrhea, unspecified type [R19.7] DISCHARGE DIAGNOSIS:  Active Problems:   Acute renal failure (ARF) (HCC)  Acute viral gastroenteritis Sepsis Acute renal failure/acute hyponatremia SECONDARY DIAGNOSIS:   Past Medical History:  Diagnosis Date  . AAA (abdominal aortic aneurysm) ([place])    a. 12/2008 s/p 7cm, endovascular repair with coiling right hypogastric artery   . Allergic rhinitis, cause unspecified   . Anxiety state 09/10/2013  . AVM (arteriovenous malformation) of colon with hemorrhage   . Bipolar 1 disorder, mixed, moderate ([place]) 04/16/2015  . Blood transfusion without reported diagnosis   . CAD (coronary artery disease)    a. 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  . Chronic diastolic CHF (congestive heart failure) ([place])    a. 04/2015 Echo: EF 55-60%, no rwma, Gr 1 DD, mild AI.  Marland Kitchen COPD (chronic obstructive pulmonary disease) ([place])    a. GOLD stage IV, started home O2. Severe bullous disease of LUL. Prolonged intubation after surgeries due to COPD.  Marland Kitchen Depression 01/14/2013  . Diverticulosis   . Essential hypertension   . Essential hypertension 08/18/2009   Qualifier: Diagnosis of  By: Doy Mince LPN, Megan    . GERD (gastroesophageal reflux disease)   . GI bleed requiring more than 4 units of blood in 24 hours, ICU, or surgery    a. Hx bleeding gastric polyps, cecal & sigmoid AVMS s/p APC 03/30/14  . Hyperlipidemia   . Insomnia 08/10/2014  . Leucocytosis 12/04/2013  . Memory loss   . Morbid obesity ([place])   . Multiple gastric polyps   . Recurrent Microcytic Anemia    a. presumed chronic GI blood  loss.  . Type II diabetes mellitus ([place])   . Vitamin D deficiency 08/10/2014   HOSPITAL COURSE:   [name] a 76 y.o. malewith a known history ofChronic anemia secondary to GI bleed from AVMs with multiple blood transfusions in the past, CAD, chronic anxiety, bipolar disorder comes to the emergency room with complaints of increasing diarrhea for 2-3 days along with generalized weakness. Patient [name] folks in his house including his wife and roommate are having similar symptoms.  1. Acute viral gastroenteritis, tolerated diet -Follow up C. difficile and GI PCR panel: Rotavirus positive. Treated with IV fluids for hydration  * Sepsis due to above. Started sepsis protocol. He was given vancomycin and I thanked him this morning. Pro-calcitonin is 0.1, GI panel show rotavirus. Discontinue antibiotics. Follow-up blood culture is negative so far.  * Lactic acidosis. Continue treatment as above, improved.  2. Acute renal failure/acute hyponatremia -Baseline creatinine 1.1 -Creatinine today 1.34 Improving with IV fluids Avoid nephrotoxins  Hyponatremia. Improved.  3. Chronic anemia secondary to multiple GI bleed from AVMs. Patient has multiple admissions in the past for blood transfusions -His hemoglobintoday's 9.2 -Transfuse as needed -He is not actively bleeding, hemoglobin is stable.  4. Hypertension continue home meds  5. COPD on chronic home oxygen,continuous -Patient is stable continue when necessary nebs treatment and inhalers  6. Physical therapy -Patient is not active at home.  Diabetes. Continue Lantus and sliding scale. The patient is not cooperative. He refused PT evaluation yesterday.  He complains of weakness, but when he got PT evaluation, he said he could not get up. He refused SNF placement. He doesn't want to go home, saying that his wife cannot help him at home. He said he had diarrhea 8 times today. But according to the nurse, he only had 2  episodes of loose stool. I called his wife twice, but nobody answered the phone. I discussed case Freight forwarder, the nurse and the nurse supervisor. DISCHARGE CONDITIONS:  Stable, discharge to home with [facility] today. CONSULTS OBTAINED:   DRUG ALLERGIES:   Allergies  Allergen Reactions  . Morphine And Related Shortness Of Breath, Nausea And Vomiting, Rash and Other (See Comments)    Reaction:  Hallucinations   . Penicillins Anaphylaxis, Hives and Other (See Comments)    Has patient had a PCN reaction causing immediate rash, facial/tongue/throat swelling, SOB or lightheadedness with hypotension: Yes Has patient had a PCN reaction causing severe rash involving mucus membranes or skin necrosis: No Has patient had a PCN reaction that required hospitalization No Has patient had a PCN reaction occurring within the last 10 years: No If all of the above answers are "NO", then may proceed with Cephalosporin use.  . Demerol [Meperidine] Other (See Comments)    Reaction:  Hallucinations    . Dilaudid [Hydromorphone Hcl] Other (See Comments)    Reaction:  Hallucinations   . Levofloxacin Other (See Comments)    Reaction:  Unknown    DISCHARGE MEDICATIONS:   Allergies as of 02/24/2017      Reactions   Morphine And Related Shortness Of Breath, Nausea And Vomiting, Rash, Other (See Comments)   Reaction:  Hallucinations    Penicillins Anaphylaxis, Hives, Other (See Comments)   Has patient had a PCN reaction causing immediate rash, facial/tongue/throat swelling, SOB or lightheadedness with hypotension: Yes Has patient had a PCN reaction causing severe rash involving mucus membranes or skin necrosis: No Has patient had a PCN reaction that required hospitalization No Has patient had a PCN reaction occurring within the last 10 years: No If all of the above answers are "NO", then may proceed with Cephalosporin use.   Demerol [meperidine] Other (See Comments)   Reaction:  Hallucinations     Dilaudid  [hydromorphone Hcl] Other (See Comments)   Reaction:  Hallucinations    Levofloxacin Other (See Comments)   Reaction:  Unknown       Medication List    STOP taking these medications   clobetasol ointment 0.05 % Commonly known as:  TEMOVATE   ibuprofen 800 MG tablet Commonly known as:  ADVIL,MOTRIN   iron polysaccharides 150 MG capsule Commonly known as:  NIFEREX     TAKE these medications   acetaminophen 325 MG tablet Commonly known as:  TYLENOL Take 650 mg by mouth every 6 (six) hours as needed for mild pain, fever or headache.   albuterol 108 (90 Base) MCG/ACT inhaler Commonly known as:  PROVENTIL HFA;VENTOLIN HFA Inhale 2 puffs into the lungs every 6 (six) hours as needed for wheezing or shortness of breath.   ALPRAZolam 0.25 MG tablet Commonly known as:  XANAX Take 0.5 mg by mouth 3 (three) times daily as needed for anxiety or sleep.   atorvastatin 40 MG tablet Commonly known as:  LIPITOR Take 40 mg by mouth at bedtime.   cholecalciferol 1000 units tablet Commonly known as:  VITAMIN D Take 1,000 Units by mouth daily.   citalopram 40 MG tablet Commonly known as:  CELEXA Take 40 mg by mouth  daily.   divalproex 500 MG DR tablet Commonly known as:  DEPAKOTE Take by mouth.   DULERA 200-5 MCG/ACT Aero Generic drug:  mometasone-formoterol Inhale 2 puffs into the lungs 2 (two) times daily.   ferrous sulfate 325 (65 FE) MG tablet Take 325 mg by mouth 3 (three) times daily with meals. What changed:  Another medication with the same name was removed. Continue taking this medication, and follow the directions you see here.   gabapentin 300 MG capsule Commonly known as:  NEURONTIN Take 300 mg by mouth 3 (three) times daily.   insulin aspart 100 UNIT/ML injection Commonly known as:  novoLOG Inject 10 Units into the skin 3 (three) times daily before meals.   insulin glargine 100 UNIT/ML injection Commonly known as:  LANTUS Inject 0.2 mLs (20 Units total) into  the skin 2 (two) times daily. What changed:  how much to take   lisinopril 2.5 MG tablet Commonly known as:  PRINIVIL,ZESTRIL Take 2.5 mg by mouth daily.   metFORMIN 500 MG tablet Commonly known as:  GLUCOPHAGE Take 500 mg by mouth 2 (two) times daily with a meal.   metoprolol succinate 25 MG 24 hr tablet Commonly known as:  TOPROL-XL Take by mouth.   metoprolol tartrate 25 MG tablet Commonly known as:  LOPRESSOR Take 25 mg by mouth 2 (two) times daily.   pantoprazole 40 MG tablet Commonly known as:  PROTONIX Take 1 tablet (40 mg total) by mouth 2 (two) times daily.   sucralfate 1 g tablet Commonly known as:  CARAFATE Take 1 g by mouth 4 (four) times daily -  with meals and at bedtime.   SYMBICORT 160-4.5 MCG/ACT inhaler Generic drug:  budesonide-formoterol Inhale into the lungs.   tiotropium 18 MCG inhalation capsule Commonly known as:  [place] into inhaler and inhale.   torsemide 20 MG tablet Commonly known as:  DEMADEX Take 1 tablet (20 mg total) by mouth 2 (two) times daily. What changed:  how much to take  when to take this   traMADol 50 MG tablet Commonly known as:  ULTRAM Take 50 mg by mouth 2 (two) times daily.   traZODone 50 MG tablet Commonly known as:  DESYREL Take by mouth.   zolpidem 5 MG tablet Commonly known as:  AMBIEN Take 1 tablet (5 mg total) by mouth at bedtime as needed for sleep.        DISCHARGE INSTRUCTIONS:  See AVS.  If you experience worsening of your admission symptoms, develop shortness of breath, life threatening emergency, suicidal or homicidal thoughts you must seek medical attention immediately by calling 911 or calling your MD immediately  if symptoms less severe.  You Must read complete instructions/literature along with all the possible adverse reactions/side effects for all the Medicines you take and that have been prescribed to you. Take any new Medicines after you have completely understood and accpet all  the possible adverse reactions/side effects.   Please note  You were cared for by a hospitalist during your hospital stay. If you have any questions about your discharge medications or the care you received while you were in the hospital after you are discharged, you can call the unit and asked to speak with the hospitalist on call if the hospitalist that took care of you is not available. Once you are discharged, your primary care physician will handle any further medical issues. Please note that NO REFILLS for any discharge medications will be authorized once you are discharged, as  it is imperative that you return to your primary care physician (or establish a relationship with a primary care physician if you do not have one) for your aftercare needs so that they can reassess your need for medications and monitor your lab values.    On the day of Discharge:  VITAL SIGNS:  Blood pressure 108/60, pulse 80, temperature 98.6 F (37 C), temperature source Oral, resp. rate 18, height 6\' 2"  (1.88 m), weight 256 lb (116.1 kg), SpO2 96 %. PHYSICAL EXAMINATION:  GENERAL:  76 y.o.-year-old patient lying in the bed with no acute distress. Obese. EYES: Pupils equal, round, reactive to light and accommodation. No scleral icterus. Extraocular muscles intact.  HEENT: Head atraumatic, normocephalic. Oropharynx and nasopharynx clear.  NECK:  Supple, no jugular venous distention. No thyroid enlargement, no tenderness.  LUNGS: Normal breath sounds bilaterally, no wheezing, rales,rhonchi or crepitation. No use of accessory muscles of respiration.  CARDIOVASCULAR: S1, S2 normal. No murmurs, rubs, or gallops.  ABDOMEN: Soft, non-tender, non-distended. Bowel sounds present. No organomegaly or mass.  EXTREMITIES: No pedal edema, cyanosis, or clubbing.  NEUROLOGIC: Cranial nerves II through XII are intact. Muscle strength 4/5 in all extremities. Sensation intact. Gait not checked.  PSYCHIATRIC: The patient is alert  and oriented x 3.  SKIN: No obvious rash, lesion, or ulcer.  DATA REVIEW:   CBC  Recent Labs Lab 02/23/17 0827  WBC 8.6  HGB 9.4*  HCT 29.1*  PLT 163    Chemistries   Recent Labs Lab 02/22/17 1216 02/23/17 0827  NA 132* 135  K 4.2 3.9  CL 94* 98*  CO2 28 27  GLUCOSE 247* 195*  BUN 28* 24*  CREATININE 1.34* 1.29*  CALCIUM 8.2* 8.5*  AST 28  --   ALT 14*  --   ALKPHOS 133*  --   BILITOT 0.5  --      Microbiology Results  Results for orders placed or performed during the hospital encounter of 02/22/17  Gastrointestinal Panel by PCR , Stool     Status: Abnormal   Collection Time: 02/22/17 11:52 PM  Result Value Ref Range Status   Campylobacter species NOT DETECTED NOT DETECTED Final   Plesimonas shigelloides NOT DETECTED NOT DETECTED Final   Salmonella species NOT DETECTED NOT DETECTED Final   Yersinia enterocolitica NOT DETECTED NOT DETECTED Final   Vibrio species NOT DETECTED NOT DETECTED Final   Vibrio cholerae NOT DETECTED NOT DETECTED Final   Enteroaggregative E coli (EAEC) NOT DETECTED NOT DETECTED Final   Enteropathogenic E coli (EPEC) NOT DETECTED NOT DETECTED Final   Enterotoxigenic E coli (ETEC) NOT DETECTED NOT DETECTED Final   Shiga like toxin producing E coli (STEC) NOT DETECTED NOT DETECTED Final   Shigella/Enteroinvasive E coli (EIEC) NOT DETECTED NOT DETECTED Final   Cryptosporidium NOT DETECTED NOT DETECTED Final   Cyclospora cayetanensis NOT DETECTED NOT DETECTED Final   Entamoeba histolytica NOT DETECTED NOT DETECTED Final   Giardia lamblia NOT DETECTED NOT DETECTED Final   Adenovirus F40/41 NOT DETECTED NOT DETECTED Final   Astrovirus NOT DETECTED NOT DETECTED Final   Norovirus GI/GII NOT DETECTED NOT DETECTED Final   Rotavirus A DETECTED (A) NOT DETECTED Final   Sapovirus (I, II, IV, and V) NOT DETECTED NOT DETECTED Final  C difficile quick scan w PCR reflex     Status: None   Collection Time: 02/22/17 11:52 PM  Result Value Ref Range  Status   C Diff antigen NEGATIVE NEGATIVE Final   C Diff toxin  NEGATIVE NEGATIVE Final   C Diff interpretation No C. difficile detected.  Final  CULTURE, BLOOD (ROUTINE X 2) w Reflex to ID Panel     Status: None (Preliminary result)   Collection Time: 02/23/17  9:37 AM  Result Value Ref Range Status   Specimen Description BLOOD LEFT WRIST  Final   Special Requests   Final    BOTTLES DRAWN AEROBIC AND ANAEROBIC Blood Culture adequate volume   Culture NO GROWTH < 24 HOURS  Final   Report Status PENDING  Incomplete  CULTURE, BLOOD (ROUTINE X 2) w Reflex to ID Panel     Status: None (Preliminary result)   Collection Time: 02/23/17 12:37 PM  Result Value Ref Range Status   Specimen Description BLOOD R THUMB  Final   Special Requests   Final    BOTTLES DRAWN AEROBIC AND ANAEROBIC Blood Culture results may not be optimal due to an inadequate volume of blood received in culture bottles   Culture NO GROWTH < 24 HOURS  Final   Report Status PENDING  Incomplete    RADIOLOGY:  No results found.   Management plans discussed with the patient, family and they are in agreement.  CODE STATUS: Full Code   TOTAL TIME TAKING CARE OF THIS PATIENT: 48 minutes.    [name] M.D on 02/24/2017 at 1:12 PM  Between 7am to 6pm - Pager - [phone]  After 6pm go to [email or web address] - Proofreader  [facility]  Office  [phone]  CC: Primary care physician; [name], MD   Note: This dictation was prepared with Dragon dictation along with smaller phrase technology. Any transcriptional errors that result from this process are unintentional.

## 2017-02-24 NOTE — Discharge Instructions (Signed)
Heart healthy and ADA diet. Fall precaution. Continue home O2 [place]. HHRN

## 2017-02-25 LAB — TYPE AND SCREEN
ABO/RH(D): B NEG
ANTIBODY SCREEN: NEGATIVE
UNIT DIVISION: 0
Unit division: 0

## 2017-02-25 LAB — BPAM RBC
BLOOD PRODUCT EXPIRATION DATE: 201805192359
Blood Product Expiration Date: 201805232359
UNIT TYPE AND RH: 1700
Unit Type and Rh: 1700

## 2017-02-25 IMAGING — DX DG CHEST 1V PORT
1 series · 1 of 1 positions shown · non-contrast
Comparison: Chest radiograph performed 02/10/2016

CLINICAL DATA: Acute onset of generalized weakness and abdominal
pain. Nausea. Initial encounter.

EXAM:
PORTABLE CHEST 1 VIEW

[chest ap]
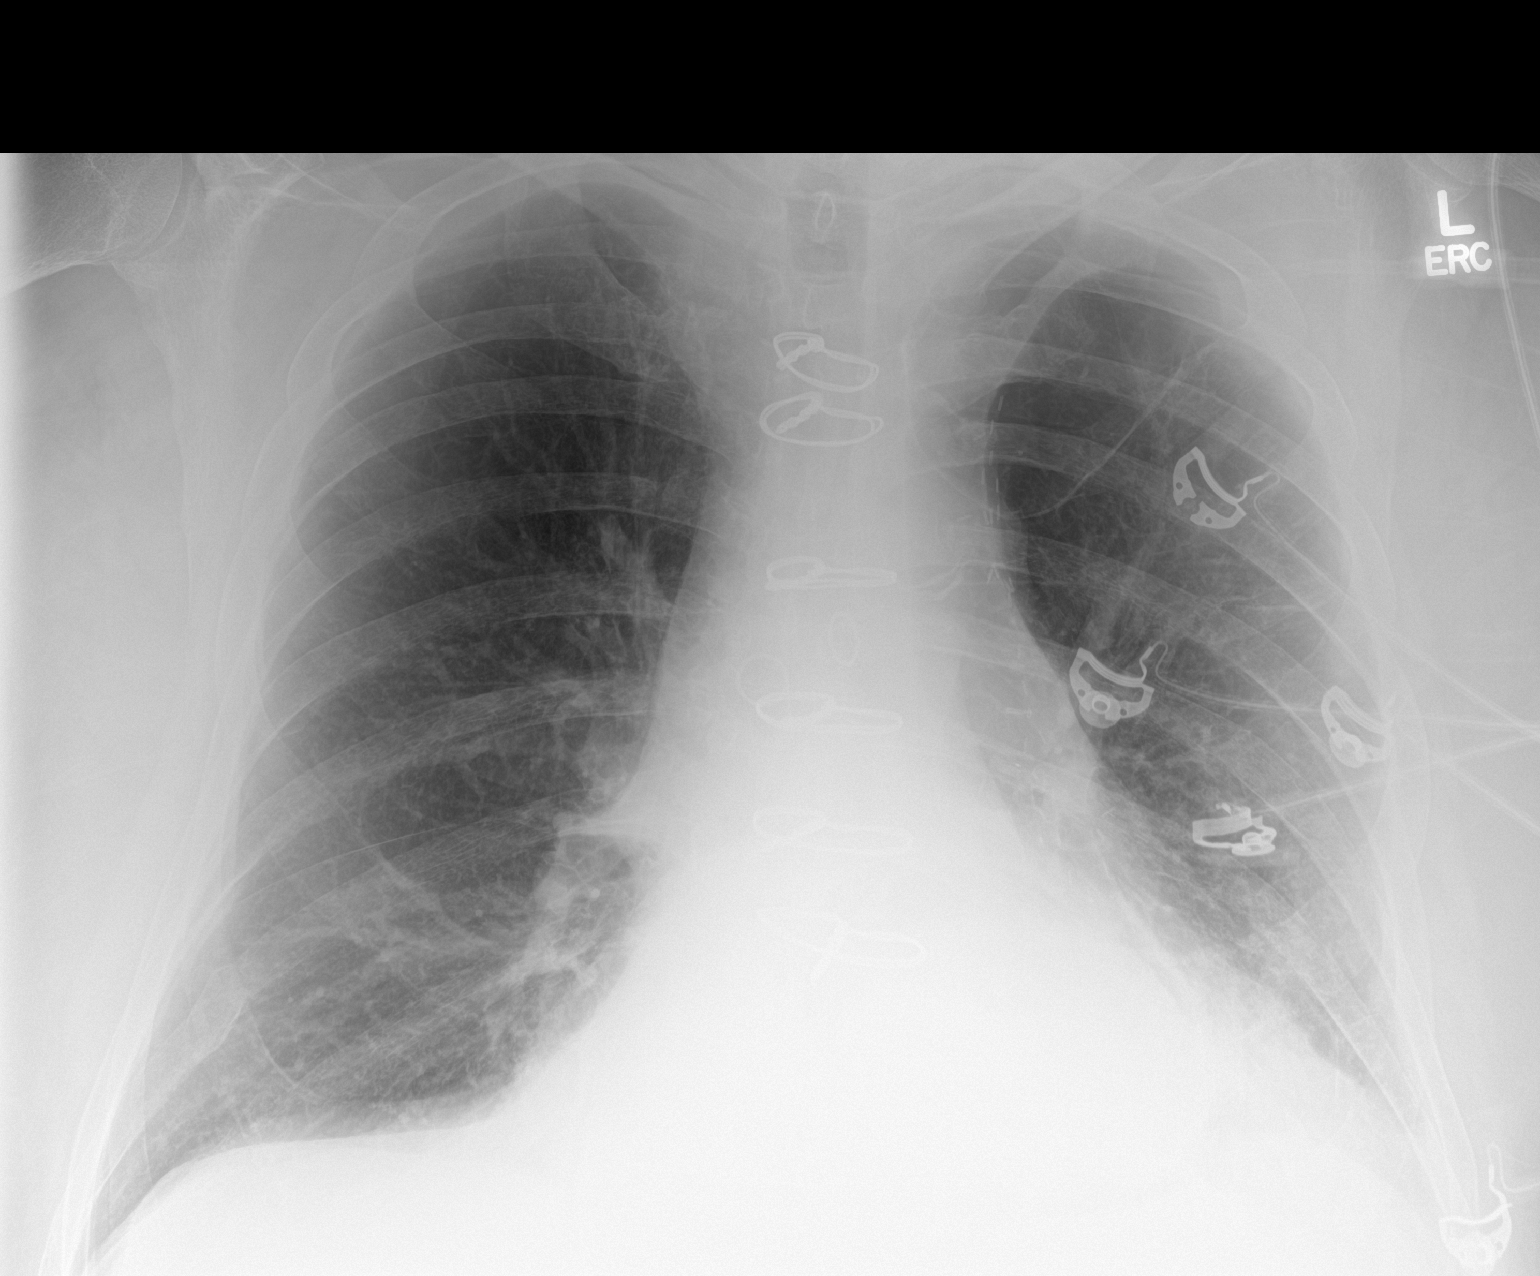

[1 of 1 positions shown; findings below may reference images not displayed]

FINDINGS: The lungs are well-aerated. Mild vascular congestion is noted.
Retrocardiac opacity may reflect atelectasis or possibly mild
infection. There is no evidence of pleural effusion or pneumothorax.

The cardiomediastinal silhouette is enlarged. The patient is status
post median sternotomy, with evidence of prior CABG. No acute
osseous abnormalities are seen.
IMPRESSION: Mild vascular congestion and cardiomegaly noted. Retrocardiac
opacity may reflect atelectasis or possibly mild infection.

## 2017-02-28 LAB — CULTURE, BLOOD (ROUTINE X 2)
CULTURE: NO GROWTH
Culture: NO GROWTH
Special Requests: ADEQUATE

## 2017-03-07 ENCOUNTER — Ambulatory Visit: Payer: Medicare HMO | Admitting: Pulmonary Disease

## 2017-03-30 ENCOUNTER — Encounter (HOSPITAL_COMMUNITY): Payer: Self-pay | Admitting: *Deleted

## 2017-03-30 ENCOUNTER — Observation Stay (HOSPITAL_COMMUNITY)
Admission: EM | Admit: 2017-03-30 | Discharge: 2017-03-31 | Disposition: A | Discharge status: Home Health | Payer: Medicare HMO | Attending: Internal Medicine | Admitting: Internal Medicine

## 2017-03-30 ENCOUNTER — Emergency Department (HOSPITAL_COMMUNITY): Payer: Medicare HMO

## 2017-03-30 DIAGNOSIS — R531 Weakness: Secondary | ICD-10-CM | POA: Diagnosis present

## 2017-03-30 DIAGNOSIS — F329 Major depressive disorder, single episode, unspecified: Secondary | ICD-10-CM | POA: Insufficient documentation

## 2017-03-30 DIAGNOSIS — I10 Essential (primary) hypertension: Secondary | ICD-10-CM | POA: Diagnosis present

## 2017-03-30 DIAGNOSIS — I5032 Chronic diastolic (congestive) heart failure: Secondary | ICD-10-CM | POA: Diagnosis not present

## 2017-03-30 DIAGNOSIS — Z6832 Body mass index (BMI) 32.0-32.9, adult: Secondary | ICD-10-CM | POA: Diagnosis not present

## 2017-03-30 DIAGNOSIS — Z880 Allergy status to penicillin: Secondary | ICD-10-CM | POA: Insufficient documentation

## 2017-03-30 DIAGNOSIS — E785 Hyperlipidemia, unspecified: Secondary | ICD-10-CM | POA: Diagnosis not present

## 2017-03-30 DIAGNOSIS — Z79899 Other long term (current) drug therapy: Secondary | ICD-10-CM | POA: Diagnosis not present

## 2017-03-30 DIAGNOSIS — E875 Hyperkalemia: Secondary | ICD-10-CM | POA: Diagnosis not present

## 2017-03-30 DIAGNOSIS — J439 Emphysema, unspecified: Secondary | ICD-10-CM | POA: Diagnosis not present

## 2017-03-30 DIAGNOSIS — G4700 Insomnia, unspecified: Secondary | ICD-10-CM | POA: Diagnosis not present

## 2017-03-30 DIAGNOSIS — J449 Chronic obstructive pulmonary disease, unspecified: Secondary | ICD-10-CM | POA: Diagnosis present

## 2017-03-30 DIAGNOSIS — J9611 Chronic respiratory failure with hypoxia: Secondary | ICD-10-CM | POA: Diagnosis not present

## 2017-03-30 DIAGNOSIS — I110 Hypertensive heart disease with heart failure: Secondary | ICD-10-CM | POA: Insufficient documentation

## 2017-03-30 DIAGNOSIS — I2510 Atherosclerotic heart disease of native coronary artery without angina pectoris: Secondary | ICD-10-CM | POA: Diagnosis present

## 2017-03-30 DIAGNOSIS — K922 Gastrointestinal hemorrhage, unspecified: Secondary | ICD-10-CM

## 2017-03-30 DIAGNOSIS — D649 Anemia, unspecified: Secondary | ICD-10-CM | POA: Diagnosis present

## 2017-03-30 DIAGNOSIS — E1140 Type 2 diabetes mellitus with diabetic neuropathy, unspecified: Secondary | ICD-10-CM | POA: Diagnosis not present

## 2017-03-30 DIAGNOSIS — Z9981 Dependence on supplemental oxygen: Secondary | ICD-10-CM | POA: Insufficient documentation

## 2017-03-30 DIAGNOSIS — F411 Generalized anxiety disorder: Secondary | ICD-10-CM | POA: Insufficient documentation

## 2017-03-30 DIAGNOSIS — Z794 Long term (current) use of insulin: Secondary | ICD-10-CM | POA: Diagnosis not present

## 2017-03-30 DIAGNOSIS — E559 Vitamin D deficiency, unspecified: Secondary | ICD-10-CM | POA: Diagnosis not present

## 2017-03-30 DIAGNOSIS — D500 Iron deficiency anemia secondary to blood loss (chronic): Secondary | ICD-10-CM | POA: Diagnosis not present

## 2017-03-30 DIAGNOSIS — K31819 Angiodysplasia of stomach and duodenum without bleeding: Secondary | ICD-10-CM

## 2017-03-30 DIAGNOSIS — K31811 Angiodysplasia of stomach and duodenum with bleeding: Principal | ICD-10-CM | POA: Diagnosis present

## 2017-03-30 DIAGNOSIS — Z881 Allergy status to other antibiotic agents status: Secondary | ICD-10-CM | POA: Diagnosis not present

## 2017-03-30 DIAGNOSIS — Z951 Presence of aortocoronary bypass graft: Secondary | ICD-10-CM | POA: Insufficient documentation

## 2017-03-30 DIAGNOSIS — Z7951 Long term (current) use of inhaled steroids: Secondary | ICD-10-CM | POA: Insufficient documentation

## 2017-03-30 DIAGNOSIS — Z885 Allergy status to narcotic agent status: Secondary | ICD-10-CM | POA: Insufficient documentation

## 2017-03-30 DIAGNOSIS — Z87891 Personal history of nicotine dependence: Secondary | ICD-10-CM | POA: Insufficient documentation

## 2017-03-30 DIAGNOSIS — K219 Gastro-esophageal reflux disease without esophagitis: Secondary | ICD-10-CM | POA: Diagnosis not present

## 2017-03-30 DIAGNOSIS — Z9119 Patient's noncompliance with other medical treatment and regimen: Secondary | ICD-10-CM | POA: Insufficient documentation

## 2017-03-30 LAB — BASIC METABOLIC PANEL
Anion gap: 7 (ref 5–15)
BUN: 16 mg/dL (ref 6–20)
CALCIUM: 8.6 mg/dL — AB (ref 8.9–10.3)
CO2: 30 mmol/L (ref 22–32)
CREATININE: 1.15 mg/dL (ref 0.61–1.24)
Chloride: 100 mmol/L — ABNORMAL LOW (ref 101–111)
GFR calc Af Amer: >60 mL/min (ref >60)
GLUCOSE: 191 mg/dL — AB (ref 65–99)
POTASSIUM: 4.9 mmol/L (ref 3.5–5.1)
Sodium: 137 mmol/L (ref 135–145)

## 2017-03-30 LAB — CBC WITH DIFFERENTIAL/PLATELET
Basophils Absolute: 0 10*3/uL (ref 0.0–0.1)
Basophils Relative: 0 %
EOS ABS: 0.2 10*3/uL (ref 0.0–0.7)
EOS PCT: 2 %
HCT: 21.2 % — ABNORMAL LOW (ref 39.0–52.0)
Hemoglobin: 6.2 g/dL — CL (ref 13.0–17.0)
LYMPHS ABS: 1.2 10*3/uL (ref 0.7–4.0)
LYMPHS PCT: 12 %
MCH: 24.4 pg — AB (ref 26.0–34.0)
MCHC: 29.2 g/dL — AB (ref 30.0–36.0)
MCV: 83.5 fL (ref 78.0–100.0)
MONO ABS: 0.7 10*3/uL (ref 0.1–1.0)
Monocytes Relative: 7 %
Neutro Abs: 7.6 10*3/uL (ref 1.7–7.7)
Neutrophils Relative %: 78 %
PLATELETS: 296 10*3/uL (ref 150–400)
RBC: 2.54 MIL/uL — AB (ref 4.22–5.81)
RDW: 17.3 % — AB (ref 11.5–15.5)
WBC: 9.7 10*3/uL (ref 4.0–10.5)

## 2017-03-30 LAB — COMPREHENSIVE METABOLIC PANEL
ALBUMIN: 3.3 g/dL — AB (ref 3.5–5.0)
ALT: 11 U/L — AB (ref 17–63)
AST: 14 U/L — AB (ref 15–41)
Alkaline Phosphatase: 130 U/L — ABNORMAL HIGH (ref 38–126)
Anion gap: 8 (ref 5–15)
BILIRUBIN TOTAL: 0.4 mg/dL (ref 0.3–1.2)
BUN: 17 mg/dL (ref 6–20)
CHLORIDE: 100 mmol/L — AB (ref 101–111)
CO2: 30 mmol/L (ref 22–32)
CREATININE: 1.14 mg/dL (ref 0.61–1.24)
Calcium: 8.5 mg/dL — ABNORMAL LOW (ref 8.9–10.3)
GFR calc Af Amer: >60 mL/min (ref >60)
GLUCOSE: 196 mg/dL — AB (ref 65–99)
POTASSIUM: 5.3 mmol/L — AB (ref 3.5–5.1)
Sodium: 138 mmol/L (ref 135–145)
Total Protein: 6.5 g/dL (ref 6.5–8.1)

## 2017-03-30 LAB — PREPARE RBC (CROSSMATCH)

## 2017-03-30 LAB — GLUCOSE, CAPILLARY: Glucose-Capillary: 230 mg/dL — ABNORMAL HIGH (ref 65–99)

## 2017-03-30 LAB — RETICULOCYTES
RBC.: 2.55 MIL/uL — ABNORMAL LOW (ref 4.22–5.81)
RETIC COUNT ABSOLUTE: 150.5 10*3/uL (ref 19.0–186.0)
Retic Ct Pct: 5.9 % — ABNORMAL HIGH (ref 0.4–3.1)

## 2017-03-30 LAB — PROTIME-INR
INR: 1.03
Prothrombin Time: 13.5 seconds (ref 11.4–15.2)

## 2017-03-30 LAB — I-STAT TROPONIN, ED: TROPONIN I, POC: 0 ng/mL (ref 0.00–0.08)

## 2017-03-30 MED ORDER — TORSEMIDE 20 MG PO TABS
40.0000 mg | ORAL_TABLET | Freq: Every day | ORAL | Status: DC
  Administered 2017-03-31: 40 mg via ORAL
  Filled 2017-03-30: qty 2

## 2017-03-30 MED ORDER — ALPRAZOLAM 0.5 MG PO TABS
0.5000 mg | ORAL_TABLET | Freq: Three times a day (TID) | ORAL | Status: DC | PRN
  Administered 2017-03-30: 0.5 mg via ORAL
  Filled 2017-03-30: qty 1

## 2017-03-30 MED ORDER — ONDANSETRON HCL 4 MG PO TABS: 4.0000 mg | ORAL_TABLET | Freq: Four times a day (QID) | ORAL | Status: DC | PRN

## 2017-03-30 MED ORDER — GABAPENTIN 300 MG PO CAPS
300.0000 mg | ORAL_CAPSULE | Freq: Three times a day (TID) | ORAL | Status: DC
  Administered 2017-03-30 – 2017-03-31 (×3): 300 mg via ORAL
  Filled 2017-03-30 (×3): qty 1

## 2017-03-30 MED ORDER — ALBUTEROL SULFATE (2.5 MG/3ML) 0.083% IN NEBU: 2.5000 mg | INHALATION_SOLUTION | RESPIRATORY_TRACT | Status: DC | PRN

## 2017-03-30 MED ORDER — FERROUS SULFATE 325 (65 FE) MG PO TABS: 325.0000 mg | ORAL_TABLET | Freq: Three times a day (TID) | ORAL | Status: DC

## 2017-03-30 MED ORDER — INSULIN ASPART 100 UNIT/ML ~~LOC~~ SOLN
0.0000 [IU] | Freq: Every day | SUBCUTANEOUS | Status: DC
  Administered 2017-03-30: 2 [IU] via SUBCUTANEOUS

## 2017-03-30 MED ORDER — FUROSEMIDE 10 MG/ML IJ SOLN
20.0000 mg | Freq: Once | INTRAMUSCULAR | Status: AC
  Administered 2017-03-30: 20 mg via INTRAVENOUS
  Filled 2017-03-30: qty 2

## 2017-03-30 MED ORDER — TIOTROPIUM BROMIDE MONOHYDRATE 18 MCG IN CAPS
18.0000 ug | ORAL_CAPSULE | Freq: Every day | RESPIRATORY_TRACT | Status: DC
  Administered 2017-03-31: 18 ug via RESPIRATORY_TRACT
  Filled 2017-03-30: qty 5

## 2017-03-30 MED ORDER — SODIUM CHLORIDE 0.9% FLUSH
3.0000 mL | Freq: Two times a day (BID) | INTRAVENOUS | Status: DC
  Administered 2017-03-30 – 2017-03-31 (×2): 3 mL via INTRAVENOUS

## 2017-03-30 MED ORDER — ATORVASTATIN CALCIUM 40 MG PO TABS
40.0000 mg | ORAL_TABLET | Freq: Every day | ORAL | Status: DC
  Administered 2017-03-30: 40 mg via ORAL
  Filled 2017-03-30 (×2): qty 1

## 2017-03-30 MED ORDER — MOMETASONE FURO-FORMOTEROL FUM 200-5 MCG/ACT IN AERO
2.0000 | INHALATION_SPRAY | Freq: Two times a day (BID) | RESPIRATORY_TRACT | Status: DC
  Administered 2017-03-31: 2 via RESPIRATORY_TRACT
  Filled 2017-03-30: qty 8.8

## 2017-03-30 MED ORDER — TRAMADOL HCL 50 MG PO TABS
50.0000 mg | ORAL_TABLET | Freq: Two times a day (BID) | ORAL | Status: DC
  Administered 2017-03-30 – 2017-03-31 (×2): 50 mg via ORAL
  Filled 2017-03-30 (×2): qty 1

## 2017-03-30 MED ORDER — INSULIN GLARGINE 100 UNIT/ML ~~LOC~~ SOLN
17.0000 [IU] | Freq: Two times a day (BID) | SUBCUTANEOUS | Status: DC
  Administered 2017-03-30 – 2017-03-31 (×2): 17 [IU] via SUBCUTANEOUS
  Filled 2017-03-30 (×3): qty 0.17

## 2017-03-30 MED ORDER — ACETAMINOPHEN 650 MG RE SUPP: 650.0000 mg | Freq: Four times a day (QID) | RECTAL | Status: DC | PRN

## 2017-03-30 MED ORDER — IPRATROPIUM-ALBUTEROL 0.5-2.5 (3) MG/3ML IN SOLN
3.0000 mL | Freq: Once | RESPIRATORY_TRACT | Status: AC
  Administered 2017-03-30: 3 mL via RESPIRATORY_TRACT
  Filled 2017-03-30: qty 3

## 2017-03-30 MED ORDER — METOPROLOL SUCCINATE ER 25 MG PO TB24
25.0000 mg | ORAL_TABLET | Freq: Every day | ORAL | Status: DC
  Administered 2017-03-31: 25 mg via ORAL
  Filled 2017-03-30: qty 1

## 2017-03-30 MED ORDER — ACETAMINOPHEN 325 MG PO TABS
650.0000 mg | ORAL_TABLET | Freq: Four times a day (QID) | ORAL | Status: DC | PRN
  Administered 2017-03-31: 650 mg via ORAL
  Filled 2017-03-30: qty 2

## 2017-03-30 MED ORDER — SODIUM CHLORIDE 0.9 % IV SOLN
80.0000 mg | Freq: Once | INTRAVENOUS | Status: AC
  Administered 2017-03-30: 80 mg via INTRAVENOUS
  Filled 2017-03-30: qty 80

## 2017-03-30 MED ORDER — CITALOPRAM HYDROBROMIDE 20 MG PO TABS
40.0000 mg | ORAL_TABLET | Freq: Every day | ORAL | Status: DC
  Administered 2017-03-31: 40 mg via ORAL
  Filled 2017-03-30: qty 2

## 2017-03-30 MED ORDER — ONDANSETRON HCL 4 MG/2ML IJ SOLN
4.0000 mg | Freq: Four times a day (QID) | INTRAMUSCULAR | Status: DC | PRN
  Filled 2017-03-30: qty 2

## 2017-03-30 MED ORDER — SUCRALFATE 1 G PO TABS
1.0000 g | ORAL_TABLET | Freq: Three times a day (TID) | ORAL | Status: DC
  Administered 2017-03-30 – 2017-03-31 (×3): 1 g via ORAL
  Filled 2017-03-30 (×3): qty 1

## 2017-03-30 MED ORDER — SODIUM CHLORIDE 0.9 % IV SOLN
10.0000 mL/h | Freq: Once | INTRAVENOUS | Status: AC
  Administered 2017-03-31: 10 mL/h via INTRAVENOUS

## 2017-03-30 MED ORDER — ZOLPIDEM TARTRATE 5 MG PO TABS
5.0000 mg | ORAL_TABLET | Freq: Every evening | ORAL | Status: DC | PRN
  Administered 2017-03-30: 5 mg via ORAL
  Filled 2017-03-30: qty 1

## 2017-03-30 MED ORDER — PANTOPRAZOLE SODIUM 40 MG PO TBEC
40.0000 mg | DELAYED_RELEASE_TABLET | Freq: Two times a day (BID) | ORAL | Status: DC
  Administered 2017-03-30 – 2017-03-31 (×2): 40 mg via ORAL
  Filled 2017-03-30 (×2): qty 1

## 2017-03-30 MED ORDER — INSULIN ASPART 100 UNIT/ML ~~LOC~~ SOLN
0.0000 [IU] | Freq: Three times a day (TID) | SUBCUTANEOUS | Status: DC
  Administered 2017-03-31: 5 [IU] via SUBCUTANEOUS
  Administered 2017-03-31: 8 [IU] via SUBCUTANEOUS

## 2017-03-30 MED ORDER — LISINOPRIL 5 MG PO TABS
2.5000 mg | ORAL_TABLET | Freq: Every day | ORAL | Status: DC
  Administered 2017-03-31: 2.5 mg via ORAL
  Filled 2017-03-30: qty 1

## 2017-03-30 NOTE — ED Triage Notes (Signed)
Per EMS, pt from home complains of generalized weakness over past 10 days. Pt has hx of GI bleed for the past 2 years.   Pt told EMS he primarily wanted to come to [facility] "for the food". EMS reports pt has been kicked out of EDs in the past for aggression towards staff. Pt states he

## 2017-03-30 NOTE — ED Notes (Signed)
Bed: DI26 Expected date:  Expected time:  Means of arrival:  Comments: 76 yo m general weakness

## 2017-03-30 NOTE — Progress Notes (Signed)
Pt arrived to unit. Pt is a&ox4. Vs stable.
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
## 2017-03-30 NOTE — ED Notes (Signed)
Date and time results received: 03/30/17 1500   Test: HEMAGLOBIN Critical Value: 6.2  Name of Provider Notified: EDP LONG  Orders Received? Or Actions Taken?: AWAITING NEW ORDERS
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
## 2017-03-30 NOTE — ED Notes (Signed)
Called respiratory for breathing treatment

## 2017-03-31 DIAGNOSIS — K922 Gastrointestinal hemorrhage, unspecified: Secondary | ICD-10-CM

## 2017-03-31 DIAGNOSIS — Q2733 Arteriovenous malformation of digestive system vessel: Secondary | ICD-10-CM

## 2017-03-31 DIAGNOSIS — D649 Anemia, unspecified: Secondary | ICD-10-CM | POA: Diagnosis not present

## 2017-03-31 DIAGNOSIS — I2510 Atherosclerotic heart disease of native coronary artery without angina pectoris: Secondary | ICD-10-CM

## 2017-03-31 DIAGNOSIS — J439 Emphysema, unspecified: Secondary | ICD-10-CM | POA: Diagnosis not present

## 2017-03-31 DIAGNOSIS — I10 Essential (primary) hypertension: Secondary | ICD-10-CM | POA: Diagnosis not present

## 2017-03-31 LAB — IRON AND TIBC
IRON: 12 ug/dL — AB (ref 45–182)
SATURATION RATIOS: 2 % — AB (ref 17.9–39.5)
TIBC: 483 ug/dL — AB (ref 250–450)
UIBC: 471 ug/dL

## 2017-03-31 LAB — COMPREHENSIVE METABOLIC PANEL
ALK PHOS: 129 U/L — AB (ref 38–126)
ALT: 11 U/L — AB (ref 17–63)
AST: 16 U/L (ref 15–41)
Albumin: 3.3 g/dL — ABNORMAL LOW (ref 3.5–5.0)
Anion gap: 9 (ref 5–15)
BILIRUBIN TOTAL: 0.7 mg/dL (ref 0.3–1.2)
BUN: 17 mg/dL (ref 6–20)
CALCIUM: 8.3 mg/dL — AB (ref 8.9–10.3)
CO2: 28 mmol/L (ref 22–32)
CREATININE: 1.22 mg/dL (ref 0.61–1.24)
Chloride: 97 mmol/L — ABNORMAL LOW (ref 101–111)
GFR calc non Af Amer: 56 mL/min — ABNORMAL LOW (ref >60)
GLUCOSE: 227 mg/dL — AB (ref 65–99)
Potassium: 4.6 mmol/L (ref 3.5–5.1)
SODIUM: 134 mmol/L — AB (ref 135–145)
TOTAL PROTEIN: 6.3 g/dL — AB (ref 6.5–8.1)

## 2017-03-31 LAB — VITAMIN B12: VITAMIN B 12: 241 pg/mL (ref 180–914)

## 2017-03-31 LAB — FOLATE: Folate: 10.7 ng/mL (ref >5.9)

## 2017-03-31 LAB — CBC
HEMATOCRIT: 26.3 % — AB (ref 39.0–52.0)
HEMOGLOBIN: 8 g/dL — AB (ref 13.0–17.0)
MCH: 25.7 pg — AB (ref 26.0–34.0)
MCHC: 30.4 g/dL (ref 30.0–36.0)
MCV: 84.6 fL (ref 78.0–100.0)
Platelets: 224 10*3/uL (ref 150–400)
RBC: 3.11 MIL/uL — ABNORMAL LOW (ref 4.22–5.81)
RDW: 16.4 % — ABNORMAL HIGH (ref 11.5–15.5)
WBC: 7 10*3/uL (ref 4.0–10.5)

## 2017-03-31 LAB — GLUCOSE, CAPILLARY
GLUCOSE-CAPILLARY: 276 mg/dL — AB (ref 65–99)
Glucose-Capillary: 242 mg/dL — ABNORMAL HIGH (ref 65–99)

## 2017-03-31 LAB — FERRITIN: Ferritin: 8 ng/mL — ABNORMAL LOW (ref 24–336)

## 2017-03-31 MED ORDER — VITAMIN B-12 1000 MCG PO TABS: 1000.0000 ug | ORAL_TABLET | Freq: Every day | ORAL | 0 refills | Status: DC

## 2017-03-31 MED ORDER — CYANOCOBALAMIN 1000 MCG/ML IJ SOLN
1000.0000 ug | Freq: Once | INTRAMUSCULAR | Status: AC
  Administered 2017-03-31: 1000 ug via INTRAMUSCULAR
  Filled 2017-03-31: qty 1

## 2017-03-31 MED ORDER — SODIUM CHLORIDE 0.9 % IV SOLN
510.0000 mg | Freq: Once | INTRAVENOUS | Status: AC
  Administered 2017-03-31: 510 mg via INTRAVENOUS
  Filled 2017-03-31: qty 17

## 2017-03-31 MED ORDER — FERROUS SULFATE 325 (65 FE) MG PO TABS: 325.0000 mg | ORAL_TABLET | Freq: Three times a day (TID) | ORAL | 1 refills | Status: DC

## 2017-03-31 NOTE — Progress Notes (Signed)
Nutrition Brief Note  Patient identified on the Malnutrition Screening Tool (MST) Report and was unsure about weight changes PTA.  Wt Readings from Last 15 Encounters:  03/30/17 257 lb 8 oz (116.8 kg)  02/22/17 256 lb (116.1 kg)  11/16/16 255 lb (115.7 kg)  11/08/16 258 lb (117 kg)  10/24/16 258 lb (117 kg)  10/09/16 270 lb (122.5 kg)  08/02/16 267 lb (121.1 kg)  07/26/16 283 lb (128.4 kg)  02/10/16 259 lb (117.5 kg)  10/25/15 265 lb (120.2 kg)  09/17/15 268 lb (121.6 kg)  08/20/15 289 lb 3.2 oz (131.2 kg)  07/27/15 272 lb (123.4 kg)  07/03/15 271 lb 1.6 oz (123 kg)  05/28/15 264 lb (119.7 kg)    Body mass index is 32.18 kg/m. Patient meets criteria for obesity based on current BMI. Weight has been stable x5 months. Skin WDL. Pt admitted for generalized weakness and found to have symptomatic anemia 2/2 chronic GI blood loss d/t AVMs.   Current diet order is Carb Modified and pt consumed 100% of breakfast this AM. Labs and medications reviewed. No nutrition interventions warranted at this time. If nutrition issues arise, please consult RD.     Howard Matin, MS, RD, LDN, [facility] Inpatient Clinical Dietitian Pager # [phone] After hours/weekend pager # [phone]

## 2017-03-31 NOTE — Discharge Summary (Signed)
Physician Discharge Summary  [name] HER:[id number] DOB: 08-May-1941 DOA: 03/30/2017  PCP: [name], MD  Admit date: 03/30/2017 Discharge date: 03/31/2017  Admitted From: home Disposition:  home  Recommendations for Outpatient Follow-up:  1. Follow up with PCP in 1-2 weeks 2. Follow up with GI as an outpatient  Home Health: PT, aide Equipment/Devices: none   Discharge Condition: stable CODE STATUS: Full code Diet recommendation: heart healthy  HPI: Per Dr. [place] Pink, [name] is a 76 y.o. 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 Patient has a long-standing history of anemia and has required multiple transfusions in the past. He used to see Dr. Benson Norway with gastroenterology in [place], but was fired from his practice due to disagreements. He then started seeing providers in [place]. He has been hospitalized many times at [facility]. His last upper endoscopy was in January. He also underwent push enteroscopy at that time, tthis had to be done under anesthesia due to patient's severe lung disease. Patient tells me that he has not noticed any black stools recently. No blood in the stool. He stopped taking his iron tablets a few months ago based on his doctor's recommendation. Denies any abdominal pain currently. No chest pain. No shortness of breath at rest. In the emergency department, patient was found to have a hemoglobin of 6.2. He was found have brown stool which is heme positive. He will be hospitalized for blood transfusion.   Hospital Course: Discharge Diagnoses:  Principal Problem:   Symptomatic anemia Active  Problems:   Essential hypertension   COPD (chronic obstructive pulmonary disease) (HCC)   CAD (coronary artery disease)   Chronic hypoxemic respiratory failure (HCC)   Iron deficiency anemia due to chronic blood loss   Gastric AVM   AVM (arteriovenous malformation) of duodenum, acquired with hemorrhage   Symptomatic anemia secondary to chronic GI blood loss -patient was admitted to the hospital with symptomatic anemia in the setting of acute on chronic anemia due to GI losses and iron deficiency. He was given 2u pRBC and GI was consulted. Gastroenterology did not recommend any inpatient procedures but outpatient follow up. He was found to have significant iron deficiency and was given a dose of IV iron. B12 was borderline and was given a B12 injection. Patient stopped his home iron supplements in the past by his PCP (per patient). Prescriptions for B12 and iron given to the patient on discharge. He did well after his hemoglobin improved, walked 100 ft with PT and felt at baseline. He was discharged home in stable condition.  GI bleeding secondary to AVMs -Patient has been followed by multiple GI groups in the area, Dr. Benson Norway as well as [place] groups.  He currently does not identify himself that he has a GI doctor. Consulted [facility], apparently he was fired from [facility] as well. Patient tells me he will look into being evaluated by [facility] [facility] group. Diabetes mellitus on insulin with diabetic neuropathy -continue home regimen  History of COPD on home oxygen/chronic hypoxic respiratory failure: -He is on 7 L oxygen at home, followed by Dr. [place] Bells, this appears stable at this time History of essential hypertension -Continue medications Chronic diastolic CHF  -  He is euvolemic.  Continue home medications.  Continue torsemide   Discharge Instructions   Allergies as of 03/31/2017      Reactions   Morphine And Related Shortness Of Breath, Nausea And Vomiting, Rash, Other (See Comments)    Reaction:  Hallucinations    Penicillins Anaphylaxis, Hives, Other (See Comments)   Has patient had a PCN reaction causing immediate rash, facial/tongue/throat swelling, SOB or lightheadedness with hypotension: Yes Has patient had a PCN reaction causing severe rash involving mucus membranes or skin necrosis: No Has patient had a PCN reaction that required hospitalization No Has patient had a PCN reaction occurring within the last 10 years: No If all of the above answers are "NO", then may proceed with Cephalosporin use.   Demerol [meperidine] Other (See Comments)   Reaction:  Hallucinations     Dilaudid [hydromorphone Hcl] Other (See Comments)   Reaction:  Hallucinations    Levofloxacin Other (See Comments)   Reaction:  Unknown       Medication List    TAKE these medications   acetaminophen 325 MG tablet Commonly known as:  TYLENOL Take 650 mg by mouth every 6 (six) hours as needed for mild pain, fever or headache.   albuterol 108 (90 Base) MCG/ACT inhaler Commonly known as:  PROVENTIL HFA;VENTOLIN HFA Inhale 2 puffs into the lungs every 6 (six) hours as needed for wheezing or shortness of breath.   ALPRAZolam 0.25 MG tablet Commonly known as:  XANAX Take 0.5 mg by mouth 3 (three) times daily as needed for anxiety or sleep.   atorvastatin 40 MG tablet Commonly known as:  LIPITOR Take 40 mg by mouth at bedtime.   cholecalciferol 1000 units tablet Commonly known as:  VITAMIN D Take 1,000 Units by mouth daily.   citalopram 40 MG tablet Commonly known as:  CELEXA Take 40 mg by mouth daily.   DULERA 200-5 MCG/ACT Aero Generic drug:  mometasone-formoterol Inhale 2 puffs into the lungs 2 (two) times daily.   ferrous sulfate 325 (65 FE) MG tablet Take 1 tablet (325 mg total) by mouth 3 (three) times daily with meals.   gabapentin 300 MG capsule Commonly known as:  NEURONTIN Take 300 mg by mouth 3 (three) times daily.   insulin aspart 100 UNIT/ML injection Commonly known  as:  novoLOG Inject 10 Units into the skin 3 (three) times daily before meals.   insulin glargine 100 UNIT/ML injection Commonly known as:  LANTUS Inject 0.2 mLs (20 Units total) into the skin 2 (two) times daily. What changed:  how much to take   lisinopril 2.5 MG tablet Commonly known as:  PRINIVIL,ZESTRIL Take 2.5 mg by mouth daily.   metFORMIN 500 MG tablet Commonly known as:  GLUCOPHAGE Take 500 mg by mouth 2 (two) times daily with a meal.   metoprolol succinate 25 MG 24 hr tablet Commonly known as:  TOPROL-XL Take 25 mg by mouth daily.   pantoprazole 40 MG tablet Commonly known as:  PROTONIX Take 1 tablet (40 mg total) by mouth 2 (two) times daily.   sucralfate 1 g tablet Commonly known as:  CARAFATE Take 1 g by mouth 4 (four) times daily -  with meals and at bedtime.   SYMBICORT 160-4.5 MCG/ACT inhaler Generic drug:  budesonide-formoterol Inhale into the lungs.   tiotropium 18 MCG inhalation capsule Commonly known as:  [place] into inhaler and inhale.   torsemide 20 MG tablet Commonly known as:  DEMADEX Take 1 tablet (20 mg total) by  mouth 2 (two) times daily. What changed:  how much to take  when to take this   traMADol 50 MG tablet Commonly known as:  ULTRAM Take 50 mg by mouth 2 (two) times daily.   traZODone 50 MG tablet Commonly known as:  DESYREL Take by mouth.   vitamin B-12 1000 MCG tablet Commonly known as:  CYANOCOBALAMIN Take 1 tablet (1,000 mcg total) by mouth daily.   zolpidem 5 MG tablet Commonly known as:  AMBIEN Take 1 tablet (5 mg total) by mouth at bedtime as needed for sleep.      Follow-up [facility]. Schedule an appointment as soon as possible for a visit in 2 week(s).   Specialty:  Gastroenterology Contact information: [place] [address] [phone]         Allergies  Allergen Reactions  . Morphine And Related Shortness Of Breath, Nausea  And Vomiting, Rash and Other (See Comments)    Reaction:  Hallucinations   . Penicillins Anaphylaxis, Hives and Other (See Comments)    Has patient had a PCN reaction causing immediate rash, facial/tongue/throat swelling, SOB or lightheadedness with hypotension: Yes Has patient had a PCN reaction causing severe rash involving mucus membranes or skin necrosis: No Has patient had a PCN reaction that required hospitalization No Has patient had a PCN reaction occurring within the last 10 years: No If all of the above answers are "NO", then may proceed with Cephalosporin use.  . Demerol [Meperidine] Other (See Comments)    Reaction:  Hallucinations    . Dilaudid [Hydromorphone Hcl] Other (See Comments)    Reaction:  Hallucinations   . Levofloxacin Other (See Comments)    Reaction:  Unknown     Consultations:  Gastroenterology   Procedures/Studies:  None   Dg Chest 2 View  Result Date: 03/30/2017 CLINICAL DATA:  Shortness of breath EXAM: CHEST  2 VIEW COMPARISON:  02/22/2017 FINDINGS: Previous median sternotomy and CABG procedure. Normal heart size. No pleural effusion or edema identified. No airspace consolidation identified. IMPRESSION: 1. No acute cardiopulmonary abnormalities. 2. Hiatal hernia. Electronically Signed   By: Kerby Moors M.D.   On: 03/30/2017 13:58      Subjective: - no chest pain, shortness of breath, no abdominal pain, nausea or vomiting.   Discharge Exam: Vitals:   03/31/17 0640 03/31/17 1403  BP: 129/73 122/64  Pulse: 91 94  Resp: 16 16  Temp: 98 F (36.7 C) 98.1 F (36.7 C)   Vitals:   03/31/17 0435 03/31/17 0640 03/31/17 0905 03/31/17 1403  BP: 134/67 129/73  122/64  Pulse: 100 91  94  Resp: 16 16  16   Temp: 98.6 F (37 C) 98 F (36.7 C)  98.1 F (36.7 C)  TempSrc: Oral Oral  Axillary  SpO2: 100% 99% 98% 97%  Weight:      Height:        General: Pt is alert, awake, not in acute distress Cardiovascular: RRR, S1/S2 +, no rubs, no  gallops Respiratory: CTA bilaterally, no wheezing, no rhonchi, overall decreased breath sounds  Abdominal: Soft, NT, ND, bowel sounds + Extremities: no edema, no cyanosis    The results of significant diagnostics from this hospitalization (including imaging, microbiology, ancillary and laboratory) are listed below for reference.     Microbiology: No results found for this or any previous visit (from the past 240 hour(s)).   Labs: BNP (last 3 results)  Recent Labs  10/09/16 0625 10/20/16 2142  BNP 58.0 159.4*   Basic Metabolic Panel:  Recent Labs Lab 03/30/17 1430 03/30/17 2028 03/31/17 0820  NA 138 137 134*  K 5.3* 4.9 4.6  CL 100* 100* 97*  CO2 30 30 28   GLUCOSE 196* 191* 227*  BUN 17 16 17   CREATININE 1.14 1.15 1.22  CALCIUM 8.5* 8.6* 8.3*   Liver Function Tests:  Recent Labs Lab 03/30/17 1430 03/31/17 0820  AST 14* 16  ALT 11* 11*  ALKPHOS 130* 129*  BILITOT 0.4 0.7  PROT 6.5 6.3*  ALBUMIN 3.3* 3.3*   No results for input(s): LIPASE, AMYLASE in the last 168 hours. No results for input(s): AMMONIA in the last 168 hours. CBC:  Recent Labs Lab 03/30/17 1430 03/31/17 0820  WBC 9.7 7.0  NEUTROABS 7.6  --   HGB 6.2* 8.0*  HCT 21.2* 26.3*  MCV 83.5 84.6  PLT 296 224   Cardiac Enzymes: No results for input(s): CKTOTAL, CKMB, CKMBINDEX, TROPONINI in the last 168 hours. BNP: Invalid input(s): POCBNP CBG:  Recent Labs Lab 03/30/17 2335 03/31/17 0741 03/31/17 1239  GLUCAP 230* 242* 276*   D-Dimer No results for input(s): DDIMER in the last 72 hours. Hgb A1c No results for input(s): HGBA1C in the last 72 hours. Lipid Profile No results for input(s): CHOL, HDL, LDLCALC, TRIG, CHOLHDL, LDLDIRECT in the last 72 hours. Thyroid function studies No results for input(s): TSH, T4TOTAL, T3FREE, THYROIDAB in the last 72 hours.  Invalid input(s): FREET3 Anemia work up  Recent Labs  03/30/17 2028  VITAMINB12 241  FOLATE 10.7  FERRITIN 8*   TIBC 483*  IRON 12*  RETICCTPCT 5.9*   Urinalysis    Component Value Date/Time   COLORURINE YELLOW (A) 02/23/2017 1230   APPEARANCEUR CLEAR (A) 02/23/2017 1230   LABSPEC 1.017 02/23/2017 1230   PHURINE 5.0 02/23/2017 1230   GLUCOSEU NEGATIVE 02/23/2017 1230   Omega 02/23/2017 [place] 02/23/2017 1230   BILIRUBINUR small 02/13/2015 1354   KETONESUR NEGATIVE 02/23/2017 1230   PROTEINUR 30 (A) 02/23/2017 1230   UROBILINOGEN 0.2 02/13/2015 1354   UROBILINOGEN 0.2 08/24/2014 1948   NITRITE NEGATIVE 02/23/2017 1230   LEUKOCYTESUR NEGATIVE 02/23/2017 1230   Sepsis Labs Invalid input(s): PROCALCITONIN,  WBC,  LACTICIDVEN Microbiology No results found for this or any previous visit (from the past 240 hour(s)).   Time coordinating discharge: 45 minutes  SIGNED:  Marzetta Board, MD  [facility] 03/31/2017, 2:31 PM Pager [phone]  If 7PM-7AM, please contact night-coverage [email or web address] Password [email or web address]

## 2017-03-31 NOTE — Care Management Obs Status (Signed)
[facility] NOTIFICATION   Patient Details  Name: [name] MRN: [id number] Date of Birth: 23-Nov-1940   Medicare Observation Status Notification Given:  Yes    MahabirJuliann Pulse, RN 03/31/2017, 2:54 PM
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
## 2017-03-31 NOTE — Progress Notes (Deleted)
PROGRESS NOTE  AMEET SANDY JGG:[id number] DOB: 04/16/1941 DOA: 03/30/2017 PCP: [name], MD   LOS: 0 days   Brief Narrative / Interim history:  SUFIAN RAVI is a 76 y.o. 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 Patient has a long-standing history of anemia and has required multiple transfusions in the past. He used to see Dr. Benson Norway with gastroenterology in [place], but was fired from his practice due to disagreements. He then started seeing providers in [place]. He has been hospitalized many times at [facility]. His last upper endoscopy was in January. He also underwent push enteroscopy at that time, tthis had to be done under anesthesia due to patient's severe lung disease.  He did not see any blood in his stools currently  Assessment & Plan: Principal Problem:   Symptomatic anemia Active Problems:   Essential hypertension   COPD (chronic obstructive pulmonary disease) (HCC)   CAD (coronary artery disease)   Chronic hypoxemic respiratory failure (HCC)   Iron deficiency anemia due to chronic blood loss   Gastric AVM   AVM (arteriovenous malformation) of duodenum, acquired with hemorrhage   Symptomatic anemia secondary to chronic GI blood loss -Patient with significant iron deficiency, he was taken off of his iron supplementation by his PCP apparently because -per patient-was damaging his kidneys.  -We will give IV iron today -His B12 is also borderline low, will give IM B12 -He was given 2 units of packed red blood cells which improved his hemoglobin from 6.2-8.0,  GI bleeding secondary to AVMs -Patient has been followed by  multiple GI groups in the area, Dr. Benson Norway as well as [place] groups.  He currently does not identify himself that he has a GI doctor -Occupational hygienist gastroenterology, appreciate him  Diabetes mellitus on insulin with diabetic neuropathy -Continue Lantus and sliding scale  History of COPD on home oxygen/chronic hypoxic respiratory failure: -He is on 7 L oxygen at home, followed by Dr. Lake Bells, this appears stable at this time  History of essential hypertension -Continue medications  Chronic diastolic CHF  -He is euvolemic.  Continue home medications.  Continue torsemide   DVT prophylaxis: SCD Code Status: Full code Family Communication: no family bedside Disposition Plan: home 1 day  Consultants:   GI  Procedures:   None   Antimicrobials:  None    Subjective: - no chest pain, shortness of breath, no abdominal pain, nausea or vomiting.   Objective: Vitals:   03/31/17 0419 03/31/17 0435 03/31/17 0640 03/31/17 0905  BP: 123/60 134/67 129/73   Pulse: 94 100 91   Resp: 18 16 16    Temp: 99 F (37.2 C) 98.6 F (37 C) 98 F (36.7 C)   TempSrc: Oral Oral Oral   SpO2: 100% 100% 99% 98%  Weight:      Height:        Intake/Output Summary (Last 24 hours) at 03/31/17 1147 Last data filed at 03/31/17 1049  Gross per 24 hour  Intake             1003 ml  Output              300 ml  Net  703 ml   Filed Weights   03/30/17 1907  Weight: 116.8 kg (257 lb 8 oz)    Examination:  Vitals:   03/31/17 0419 03/31/17 0435 03/31/17 0640 03/31/17 0905  BP: 123/60 134/67 129/73   Pulse: 94 100 91   Resp: 18 16 16    Temp: 99 F (37.2 C) 98.6 F (37 C) 98 F (36.7 C)   TempSrc: Oral Oral Oral   SpO2: 100% 100% 99% 98%  Weight:      Height:        Constitutional: NAD Eyes: PERRL, lids and conjunctivae normal Respiratory: clear to auscultation bilaterally, no wheezing, no crackles. Normal respiratory effort. No accessory muscle use.  Overall decreased breath  sounds Cardiovascular: Regular rate and rhythm, no murmurs / rubs / gallops. No LE edema. 2+ pedal pulses.   Abdomen: no tenderness. Bowel sounds positive.  Skin: no rashes, lesions, ulcers. No induration Neurologic: CN 2-12 grossly intact. Strength 5/5 in all 4.  Psychiatric: Normal judgment and insight. Alert and oriented x 3. Normal mood.    Data Reviewed: I have independently reviewed following labs and imaging studies   CBC:  Recent Labs Lab 03/30/17 1430  WBC 9.7  NEUTROABS 7.6  HGB 6.2*  HCT 21.2*  MCV 83.5  PLT 782   Basic Metabolic Panel:  Recent Labs Lab 03/30/17 1430 03/30/17 2028 03/31/17 0820  NA 138 137 134*  K 5.3* 4.9 4.6  CL 100* 100* 97*  CO2 30 30 28   GLUCOSE 196* 191* 227*  BUN 17 16 17   CREATININE 1.14 1.15 1.22  CALCIUM 8.5* 8.6* 8.3*   GFR: Estimated Creatinine Clearance: 72.1 mL/min (by C-G formula based on SCr of 1.22 mg/dL). Liver Function Tests:  Recent Labs Lab 03/30/17 1430 03/31/17 0820  AST 14* 16  ALT 11* 11*  ALKPHOS 130* 129*  BILITOT 0.4 0.7  PROT 6.5 6.3*  ALBUMIN 3.3* 3.3*   No results for input(s): LIPASE, AMYLASE in the last 168 hours. No results for input(s): AMMONIA in the last 168 hours. Coagulation Profile:  Recent Labs Lab 03/30/17 1552  INR 1.03   Cardiac Enzymes: No results for input(s): CKTOTAL, CKMB, CKMBINDEX, TROPONINI in the last 168 hours. BNP (last 3 results) No results for input(s): PROBNP in the last 8760 hours. HbA1C: No results for input(s): HGBA1C in the last 72 hours. CBG:  Recent Labs Lab 03/30/17 2335 03/31/17 0741  GLUCAP 230* 242*   Lipid Profile: No results for input(s): CHOL, HDL, LDLCALC, TRIG, CHOLHDL, LDLDIRECT in the last 72 hours. Thyroid Function Tests: No results for input(s): TSH, T4TOTAL, FREET4, T3FREE, THYROIDAB in the last 72 hours. Anemia Panel:  Recent Labs  03/30/17 2028  VITAMINB12 241  FOLATE 10.7  FERRITIN 8*  TIBC 483*  IRON 12*  RETICCTPCT 5.9*    Urine analysis:    Component Value Date/Time   COLORURINE YELLOW (A) 02/23/2017 1230   APPEARANCEUR CLEAR (A) 02/23/2017 1230   LABSPEC 1.017 02/23/2017 1230   PHURINE 5.0 02/23/2017 1230   GLUCOSEU NEGATIVE 02/23/2017 1230   HGBUR NEGATIVE 02/23/2017 [place] 02/23/2017 1230   BILIRUBINUR small 02/13/2015 1354   KETONESUR NEGATIVE 02/23/2017 1230   PROTEINUR 30 (A) 02/23/2017 1230   UROBILINOGEN 0.2 02/13/2015 1354   UROBILINOGEN 0.2 08/24/2014 1948   NITRITE NEGATIVE 02/23/2017 1230   LEUKOCYTESUR NEGATIVE 02/23/2017 1230   Sepsis Labs: Invalid input(s): PROCALCITONIN, LACTICIDVEN  No results found for this or any previous visit (from the past 240 hour(s)).  Radiology Studies: Dg Chest 2 View  Result Date: 03/30/2017 CLINICAL DATA:  Shortness of breath EXAM: CHEST  2 VIEW COMPARISON:  02/22/2017 FINDINGS: Previous median sternotomy and CABG procedure. Normal heart size. No pleural effusion or edema identified. No airspace consolidation identified. IMPRESSION: 1. No acute cardiopulmonary abnormalities. 2. Hiatal hernia. Electronically Signed   By: Kerby Moors M.D.   On: 03/30/2017 13:58     Scheduled Meds: . atorvastatin  40 mg Oral QHS  . citalopram  40 mg Oral Daily  . gabapentin  300 mg Oral TID  . insulin aspart  0-15 Units Subcutaneous TID WC  . insulin aspart  0-5 Units Subcutaneous QHS  . insulin glargine  17 Units Subcutaneous BID  . lisinopril  2.5 mg Oral Daily  . metoprolol succinate  25 mg Oral Daily  . mometasone-formoterol  2 puff Inhalation BID  . pantoprazole  40 mg Oral BID  . sodium chloride flush  3 mL Intravenous Q12H  . sucralfate  1 g Oral TID WC & HS  . tiotropium  18 mcg Inhalation Daily  . torsemide  40 mg Oral Daily  . traMADol  50 mg Oral BID   Continuous Infusions:    Marzetta Board, MD, PhD [facility] Pager [phone]  If 7PM-7AM, please contact night-coverage [email or web address] Password  [facility] 03/31/2017, 11:47 AM

## 2017-03-31 NOTE — Discharge Instructions (Signed)
Follow with [name], MD in 5-7 days  Please get a complete blood count and chemistry panel checked by your Primary MD at your next visit, and again as instructed by your Primary MD. Please get your medications reviewed and adjusted by your Primary MD.  Please request your Primary MD to go over all Hospital Tests and Procedure/Radiological results at the follow up, please get all Hospital records sent to your Prim MD by signing hospital release before you go home.  If you had Pneumonia of Lung problems at the Hospital: Please get a 2 view Chest X ray done in 6-8 weeks after hospital discharge or sooner if instructed by your Primary MD.  If you have Congestive Heart Failure: Please call your Cardiologist or Primary MD anytime you have any of the following symptoms:  1) 3 pound weight gain in 24 hours or 5 pounds in 1 week  2) shortness of breath, with or without a dry hacking cough  3) swelling in the hands, feet or stomach  4) if you have to sleep on extra pillows at night in order to breathe  Follow cardiac low salt diet and 1.5 lit/day fluid restriction.  If you have diabetes Accuchecks 4 times/day, Once in AM empty stomach and then before each meal. Log in all results and show them to your primary doctor at your next visit. If any glucose reading is under 80 or above 300 call your primary MD immediately.  If you have Seizure/Convulsions/Epilepsy: Please do not drive, operate heavy machinery, participate in activities at heights or participate in high speed sports until you have seen by Primary MD or a Neurologist and advised to do so again.  If you had Gastrointestinal Bleeding: Please ask your Primary MD to check a complete blood count within one week of discharge or at your next visit. Your endoscopic/colonoscopic biopsies that are pending at the time of discharge, will also need to followed by your Primary MD.  Get Medicines reviewed and adjusted. Please take all your  medications with you for your next visit with your Primary MD  Please request your Primary MD to go over all hospital tests and procedure/radiological results at the follow up, please ask your Primary MD to get all Hospital records sent to his/her office.  If you experience worsening of your admission symptoms, develop shortness of breath, life threatening emergency, suicidal or homicidal thoughts you must seek medical attention immediately by calling 911 or calling your MD immediately  if symptoms less severe.  You must read complete instructions/literature along with all the possible adverse reactions/side effects for all the Medicines you take and that have been prescribed to you. Take any new Medicines after you have completely understood and accpet all the possible adverse reactions/side effects.   Do not drive or operate heavy machinery when taking Pain medications.   Do not take more than prescribed Pain, Sleep and Anxiety Medications  Special Instructions: If you have smoked or chewed Tobacco  in the last 2 yrs please stop smoking, stop any regular Alcohol  and or any Recreational drug use.  Wear Seat belts while driving.  Please note You were cared for by a hospitalist during your hospital stay. If you have any questions about your discharge medications or the care you received while you were in the hospital after you are discharged, you can call the unit and asked to speak with the hospitalist on call if the hospitalist that took care of you is not available. Once  you are discharged, your primary care physician will handle any further medical issues. Please note that NO REFILLS for any discharge medications will be authorized once you are discharged, as it is imperative that you return to your primary care physician (or establish a relationship with a primary care physician if you do not have one) for your aftercare needs so that they can reassess your need for medications and monitor your  lab values.  You can reach the hospitalist office at phone [phone] or fax [phone]   If you do not have a primary care physician, you can call [phone] for a physician referral.  Activity: As tolerated with Full fall precautions use walker/cane & assistance as needed  Diet: regular  Disposition Home

## 2017-03-31 NOTE — Care Management Note (Signed)
Case Management Note  Patient Details  Name: DUB MACLELLAN MRN: [id number] Date of Birth: 14-Oct-1941  Subjective/Objective: Patient active w/Home 02, has travel tank-[facility] they provide the respiratory care-rep Maudie Mercury aware of d/c. [facility] chosen by patient for [employer] aware of d/c & HHC orders. No further CM needs.                  Action/Plan:d/c home w/HHC   Expected Discharge Date:  03/31/17               Expected Discharge Plan:  [place]  In-House Referral:     Discharge planning Services  CM Consult  Post Acute Care Choice:  Durable Medical Equipment (home [facility]) Choice offered to:  Patient  DME Arranged:    DME Agency:     HH Arranged:  RN, PT, Nurse's Aide, Respirator Therapy (Respiratory care is provided by [facility] since they provide the home 02) [name]:  [place]  Status of Service:  Completed, signed off  If discussed at [place] of Stay Meetings, dates discussed:    Additional Comments:  Dessa Phi, RN 03/31/2017, 2:51 PM

## 2017-03-31 NOTE — Evaluation (Signed)
Physical Therapy Evaluation Patient Details Name: [name] MRN: [id number] DOB: Jan 14, 1941 Today's Date: 03/31/2017   History of Present Illness  76 y.o. 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 Hgb 6.2.   Clinical Impression  Pt admitted with above diagnosis. Pt currently with functional limitations due to the deficits listed below (see PT Problem List). Pt ambulated 100' with RW, SaO2 92% on 8L O2 with high flow cannula. Distance limited by SOB/fatigue.  Pt will benefit from skilled PT to increase their independence and safety with mobility to allow discharge to the venue listed below.       Follow Up Recommendations Home health PT;Supervision for mobility/OOB    Equipment Recommendations  None recommended by PT    Recommendations for Other Services       Precautions / Restrictions Precautions Precautions: Fall Precaution Comments: 8 falls in past 1 year Restrictions Weight Bearing Restrictions: No      Mobility  Bed Mobility Overal bed mobility: Modified Independent             General bed mobility comments: with rail  Transfers Overall transfer level: Needs assistance Equipment used: Rolling walker (2 wheeled) Transfers: Sit to/from Stand Sit to Stand: Min assist         General transfer comment: min A to rise  Ambulation/Gait Ambulation/Gait assistance: Supervision Ambulation Distance (Feet): 100 Feet Assistive device: Rolling walker (2 wheeled) Gait Pattern/deviations: Step-through pattern;Trunk flexed     General Gait Details: 92% on 8L O2 with HFNC, distance limited by fatigue, 2/4 dyspnea  Stairs             Wheelchair Mobility    Modified Rankin (Stroke Patients Only)       Balance Overall balance assessment: History of Falls;Needs assistance   Sitting balance-Leahy Scale: Good       Standing balance-Leahy Scale: Fair                               Pertinent Vitals/Pain Pain Assessment: No/denies pain    Home Living Family/patient expects to be discharged to:: Private residence Living Arrangements: Spouse/significant other Available Help at Discharge: Family Type of Home: House Home Access: Stairs to enter Entrance Stairs-Rails: Psychiatric nurse of Steps: 5 Home Layout: One level Home Equipment: Environmental consultant - 2 wheels;Bedside commode;Cane - quad;Shower seat - built in      Prior Function Level of Independence: Needs assistance   Gait / Transfers Assistance Needed: very short distances at home, no AD, limited by SOB     Comments: He takes sponge baths.  He does not use AD to ambulate but is a limited household ambulator. Uses 7L O2 at home.     Hand Dominance   Dominant Hand: Right    Extremity/Trunk Assessment   Upper Extremity Assessment Upper Extremity Assessment: Overall WFL for tasks assessed    Lower Extremity Assessment Lower Extremity Assessment: Overall WFL for tasks assessed (knee ext 4/5 B)    Cervical / Trunk Assessment Cervical / Trunk Assessment: Normal  Communication   Communication: No difficulties  Cognition Arousal/Alertness: Awake/alert Behavior During Therapy: WFL for tasks assessed/performed Overall Cognitive Status:  Within Functional Limits for tasks assessed                                        General Comments      Exercises     Assessment/Plan    PT Assessment Patient needs continued PT services  PT Problem List Decreased activity tolerance;Cardiopulmonary status limiting activity;Decreased balance;Decreased mobility       PT Treatment Interventions Gait training;DME  instruction;Functional mobility training;Balance training;Therapeutic exercise;Patient/family education    PT Goals (Current goals can be found in the Care Plan section)  Acute Rehab PT Goals Patient Stated Goal: to be able to walk farther PT Goal Formulation: With patient Time For Goal Achievement: 04/14/17 Potential to Achieve Goals: Good    Frequency Min 3X/week   Barriers to discharge        Co-evaluation               AM-PAC PT "6 Clicks" Daily Activity  Outcome Measure Difficulty turning over in bed (including adjusting bedclothes, sheets and blankets)?: None Difficulty moving from lying on back to sitting on the side of the bed? : A Little Difficulty sitting down on and standing up from a chair with arms (e.g., wheelchair, bedside commode, etc,.)?: A Little Help needed moving to and from a bed to chair (including a wheelchair)?: A Little Help needed walking in hospital room?: A Little Help needed climbing 3-5 steps with a railing? : A Lot 6 Click Score: 18    End of Session Equipment Utilized During Treatment: Gait belt;Oxygen Activity Tolerance: Patient limited by fatigue Patient left: in chair;with call bell/phone within reach Nurse Communication: Mobility status PT Visit Diagnosis: Repeated falls (R29.6);Difficulty in walking, not elsewhere classified (R26.2)    Time: 7628-3151 PT Time Calculation (min) (ACUTE ONLY): 29 min   Charges:   PT Evaluation $PT Eval Moderate Complexity: 1 Procedure PT Treatments $Gait Training: 8-22 mins   PT G Codes:         Philomena Doheny 03/31/2017, 1:58 PM [phone]

## 2017-04-03 ENCOUNTER — Encounter (HOSPITAL_COMMUNITY): Payer: Self-pay | Admitting: Emergency Medicine

## 2017-04-03 ENCOUNTER — Emergency Department (HOSPITAL_COMMUNITY): Payer: Medicare HMO

## 2017-04-03 ENCOUNTER — Inpatient Hospital Stay (HOSPITAL_COMMUNITY)
Admission: EM | Admit: 2017-04-03 | Discharge: 2017-04-10 | DRG: 378 | Disposition: A | Discharge status: Home Health | Payer: Medicare HMO | Attending: Internal Medicine | Admitting: Internal Medicine

## 2017-04-03 ENCOUNTER — Observation Stay (HOSPITAL_COMMUNITY): Payer: Medicare HMO

## 2017-04-03 DIAGNOSIS — F411 Generalized anxiety disorder: Secondary | ICD-10-CM | POA: Diagnosis present

## 2017-04-03 DIAGNOSIS — K31811 Angiodysplasia of stomach and duodenum with bleeding: Secondary | ICD-10-CM | POA: Diagnosis present

## 2017-04-03 DIAGNOSIS — R079 Chest pain, unspecified: Secondary | ICD-10-CM | POA: Diagnosis present

## 2017-04-03 DIAGNOSIS — E876 Hypokalemia: Secondary | ICD-10-CM | POA: Diagnosis present

## 2017-04-03 DIAGNOSIS — Z9189 Other specified personal risk factors, not elsewhere classified: Secondary | ICD-10-CM

## 2017-04-03 DIAGNOSIS — K921 Melena: Secondary | ICD-10-CM

## 2017-04-03 DIAGNOSIS — G629 Polyneuropathy, unspecified: Secondary | ICD-10-CM | POA: Diagnosis present

## 2017-04-03 DIAGNOSIS — Z881 Allergy status to other antibiotic agents status: Secondary | ICD-10-CM

## 2017-04-03 DIAGNOSIS — Z7951 Long term (current) use of inhaled steroids: Secondary | ICD-10-CM

## 2017-04-03 DIAGNOSIS — Q2733 Arteriovenous malformation of digestive system vessel: Secondary | ICD-10-CM | POA: Diagnosis not present

## 2017-04-03 DIAGNOSIS — I5032 Chronic diastolic (congestive) heart failure: Secondary | ICD-10-CM | POA: Diagnosis present

## 2017-04-03 DIAGNOSIS — F3162 Bipolar disorder, current episode mixed, moderate: Secondary | ICD-10-CM | POA: Diagnosis present

## 2017-04-03 DIAGNOSIS — D62 Acute posthemorrhagic anemia: Secondary | ICD-10-CM | POA: Diagnosis present

## 2017-04-03 DIAGNOSIS — Z880 Allergy status to penicillin: Secondary | ICD-10-CM

## 2017-04-03 DIAGNOSIS — Z8711 Personal history of peptic ulcer disease: Secondary | ICD-10-CM

## 2017-04-03 DIAGNOSIS — I130 Hypertensive heart and chronic kidney disease with heart failure and stage 1 through stage 4 chronic kidney disease, or unspecified chronic kidney disease: Secondary | ICD-10-CM | POA: Diagnosis present

## 2017-04-03 DIAGNOSIS — E785 Hyperlipidemia, unspecified: Secondary | ICD-10-CM | POA: Diagnosis present

## 2017-04-03 DIAGNOSIS — Z794 Long term (current) use of insulin: Secondary | ICD-10-CM

## 2017-04-03 DIAGNOSIS — Z87891 Personal history of nicotine dependence: Secondary | ICD-10-CM

## 2017-04-03 DIAGNOSIS — D125 Benign neoplasm of sigmoid colon: Secondary | ICD-10-CM

## 2017-04-03 DIAGNOSIS — I714 Abdominal aortic aneurysm, without rupture: Secondary | ICD-10-CM | POA: Diagnosis present

## 2017-04-03 DIAGNOSIS — I10 Essential (primary) hypertension: Secondary | ICD-10-CM | POA: Diagnosis present

## 2017-04-03 DIAGNOSIS — N183 Chronic kidney disease, stage 3 (moderate): Secondary | ICD-10-CM | POA: Diagnosis present

## 2017-04-03 DIAGNOSIS — E1122 Type 2 diabetes mellitus with diabetic chronic kidney disease: Secondary | ICD-10-CM | POA: Diagnosis present

## 2017-04-03 DIAGNOSIS — K922 Gastrointestinal hemorrhage, unspecified: Secondary | ICD-10-CM | POA: Diagnosis present

## 2017-04-03 DIAGNOSIS — I252 Old myocardial infarction: Secondary | ICD-10-CM

## 2017-04-03 DIAGNOSIS — K31819 Angiodysplasia of stomach and duodenum without bleeding: Secondary | ICD-10-CM

## 2017-04-03 DIAGNOSIS — Z9119 Patient's noncompliance with other medical treatment and regimen: Secondary | ICD-10-CM

## 2017-04-03 DIAGNOSIS — K5750 Diverticulosis of both small and large intestine without perforation or abscess without bleeding: Secondary | ICD-10-CM | POA: Diagnosis present

## 2017-04-03 DIAGNOSIS — J439 Emphysema, unspecified: Secondary | ICD-10-CM | POA: Diagnosis not present

## 2017-04-03 DIAGNOSIS — J9611 Chronic respiratory failure with hypoxia: Secondary | ICD-10-CM | POA: Diagnosis not present

## 2017-04-03 DIAGNOSIS — J449 Chronic obstructive pulmonary disease, unspecified: Secondary | ICD-10-CM | POA: Diagnosis present

## 2017-04-03 DIAGNOSIS — D500 Iron deficiency anemia secondary to blood loss (chronic): Secondary | ICD-10-CM | POA: Diagnosis present

## 2017-04-03 DIAGNOSIS — E875 Hyperkalemia: Secondary | ICD-10-CM | POA: Diagnosis present

## 2017-04-03 DIAGNOSIS — K449 Diaphragmatic hernia without obstruction or gangrene: Secondary | ICD-10-CM | POA: Diagnosis present

## 2017-04-03 DIAGNOSIS — Z6832 Body mass index (BMI) 32.0-32.9, adult: Secondary | ICD-10-CM

## 2017-04-03 DIAGNOSIS — Z885 Allergy status to narcotic agent status: Secondary | ICD-10-CM

## 2017-04-03 DIAGNOSIS — G8929 Other chronic pain: Secondary | ICD-10-CM | POA: Diagnosis present

## 2017-04-03 DIAGNOSIS — Z79891 Long term (current) use of opiate analgesic: Secondary | ICD-10-CM

## 2017-04-03 DIAGNOSIS — D124 Benign neoplasm of descending colon: Secondary | ICD-10-CM

## 2017-04-03 DIAGNOSIS — D649 Anemia, unspecified: Secondary | ICD-10-CM | POA: Diagnosis not present

## 2017-04-03 DIAGNOSIS — K5521 Angiodysplasia of colon with hemorrhage: Secondary | ICD-10-CM

## 2017-04-03 DIAGNOSIS — Z951 Presence of aortocoronary bypass graft: Secondary | ICD-10-CM

## 2017-04-03 DIAGNOSIS — K210 Gastro-esophageal reflux disease with esophagitis: Secondary | ICD-10-CM | POA: Diagnosis present

## 2017-04-03 DIAGNOSIS — G4700 Insomnia, unspecified: Secondary | ICD-10-CM | POA: Diagnosis present

## 2017-04-03 DIAGNOSIS — K5520 Angiodysplasia of colon without hemorrhage: Secondary | ICD-10-CM | POA: Diagnosis present

## 2017-04-03 DIAGNOSIS — E1165 Type 2 diabetes mellitus with hyperglycemia: Secondary | ICD-10-CM | POA: Diagnosis present

## 2017-04-03 DIAGNOSIS — R1032 Left lower quadrant pain: Secondary | ICD-10-CM

## 2017-04-03 DIAGNOSIS — Z79899 Other long term (current) drug therapy: Secondary | ICD-10-CM

## 2017-04-03 DIAGNOSIS — Z9981 Dependence on supplemental oxygen: Secondary | ICD-10-CM

## 2017-04-03 DIAGNOSIS — K317 Polyp of stomach and duodenum: Secondary | ICD-10-CM | POA: Diagnosis present

## 2017-04-03 DIAGNOSIS — I2510 Atherosclerotic heart disease of native coronary artery without angina pectoris: Secondary | ICD-10-CM | POA: Diagnosis present

## 2017-04-03 DIAGNOSIS — R000 Tachycardia, unspecified: Secondary | ICD-10-CM | POA: Diagnosis present

## 2017-04-03 DIAGNOSIS — R413 Other amnesia: Secondary | ICD-10-CM | POA: Diagnosis present

## 2017-04-03 HISTORY — DX: Acute myocardial infarction, unspecified: I21.9

## 2017-04-03 HISTORY — DX: Dependence on supplemental oxygen: Z99.81

## 2017-04-03 HISTORY — DX: Duodenal ulcer, unspecified as acute or chronic, without hemorrhage or perforation: K26.9

## 2017-04-03 HISTORY — DX: Pneumonia, unspecified organism: J18.9

## 2017-04-03 HISTORY — DX: Emphysema, unspecified: J43.9

## 2017-04-03 HISTORY — DX: Personal history of other medical treatment: Z92.89

## 2017-04-03 LAB — COMPREHENSIVE METABOLIC PANEL
ALBUMIN: 3.2 g/dL — AB (ref 3.5–5.0)
ALT: 10 U/L — ABNORMAL LOW (ref 17–63)
AST: 13 U/L — AB (ref 15–41)
Alkaline Phosphatase: 116 U/L (ref 38–126)
Anion gap: 9 (ref 5–15)
BILIRUBIN TOTAL: 0.6 mg/dL (ref 0.3–1.2)
BUN: 45 mg/dL — AB (ref 6–20)
CHLORIDE: 97 mmol/L — AB (ref 101–111)
CO2: 31 mmol/L (ref 22–32)
Calcium: 8.8 mg/dL — ABNORMAL LOW (ref 8.9–10.3)
Creatinine, Ser: 1.33 mg/dL — ABNORMAL HIGH (ref 0.61–1.24)
GFR calc Af Amer: 59 mL/min — ABNORMAL LOW (ref >60)
GFR calc non Af Amer: 51 mL/min — ABNORMAL LOW (ref >60)
GLUCOSE: 261 mg/dL — AB (ref 65–99)
POTASSIUM: 4.9 mmol/L (ref 3.5–5.1)
SODIUM: 137 mmol/L (ref 135–145)
Total Protein: 6.4 g/dL — ABNORMAL LOW (ref 6.5–8.1)

## 2017-04-03 LAB — GLUCOSE, CAPILLARY
Glucose-Capillary: 542 mg/dL (ref 65–99)
Glucose-Capillary: 458 mg/dL — ABNORMAL HIGH (ref 65–99)

## 2017-04-03 LAB — TYPE AND SCREEN
ABO/RH(D): B NEG
ANTIBODY SCREEN: NEGATIVE
UNIT DIVISION: 0
Unit division: 0

## 2017-04-03 LAB — GLUCOSE, RANDOM: GLUCOSE: 517 mg/dL — AB (ref 65–99)

## 2017-04-03 LAB — BPAM RBC
BLOOD PRODUCT EXPIRATION DATE: 201806292359
Blood Product Expiration Date: 201807012359
ISSUE DATE / TIME: 201806150033
ISSUE DATE / TIME: 201806150406
UNIT TYPE AND RH: 1700
Unit Type and Rh: 1700

## 2017-04-03 LAB — CBC WITH DIFFERENTIAL/PLATELET
BASOS PCT: 0 %
Basophils Absolute: 0 10*3/uL (ref 0.0–0.1)
EOS PCT: 1 %
Eosinophils Absolute: 0.2 10*3/uL (ref 0.0–0.7)
HEMATOCRIT: 27.7 % — AB (ref 39.0–52.0)
HEMOGLOBIN: 8 g/dL — AB (ref 13.0–17.0)
LYMPHS ABS: 2.4 10*3/uL (ref 0.7–4.0)
Lymphocytes Relative: 14 %
MCH: 25.4 pg — AB (ref 26.0–34.0)
MCHC: 28.9 g/dL — AB (ref 30.0–36.0)
MCV: 87.9 fL (ref 78.0–100.0)
MONOS PCT: 7 %
Monocytes Absolute: 1.2 10*3/uL — ABNORMAL HIGH (ref 0.1–1.0)
NEUTROS ABS: 13.4 10*3/uL — AB (ref 1.7–7.7)
Neutrophils Relative %: 78 %
Platelets: 295 10*3/uL (ref 150–400)
RBC: 3.15 MIL/uL — ABNORMAL LOW (ref 4.22–5.81)
RDW: 17.4 % — ABNORMAL HIGH (ref 11.5–15.5)
WBC: 17.2 10*3/uL — ABNORMAL HIGH (ref 4.0–10.5)

## 2017-04-03 LAB — TROPONIN I: Troponin I: <0.03 ng/mL (ref <0.03)

## 2017-04-03 LAB — HEMOGLOBIN AND HEMATOCRIT, BLOOD
HEMATOCRIT: 31 % — AB (ref 39.0–52.0)
Hemoglobin: 9.3 g/dL — ABNORMAL LOW (ref 13.0–17.0)

## 2017-04-03 LAB — PREPARE RBC (CROSSMATCH)

## 2017-04-03 LAB — BRAIN NATRIURETIC PEPTIDE: B Natriuretic Peptide: 84.2 pg/mL (ref 0.0–100.0)

## 2017-04-03 LAB — POC OCCULT BLOOD, ED: Fecal Occult Bld: POSITIVE — AB

## 2017-04-03 MED ORDER — ONDANSETRON HCL 4 MG/2ML IJ SOLN: 4.0000 mg | Freq: Four times a day (QID) | INTRAMUSCULAR | Status: DC | PRN

## 2017-04-03 MED ORDER — SODIUM CHLORIDE 0.9 % IV SOLN: Freq: Once | INTRAVENOUS | Status: DC

## 2017-04-03 MED ORDER — LISINOPRIL 5 MG PO TABS
2.5000 mg | ORAL_TABLET | Freq: Every day | ORAL | Status: DC
  Administered 2017-04-04 – 2017-04-05 (×2): 2.5 mg via ORAL
  Filled 2017-04-03 (×5): qty 1

## 2017-04-03 MED ORDER — METOPROLOL TARTRATE 25 MG PO TABS
25.0000 mg | ORAL_TABLET | Freq: Two times a day (BID) | ORAL | Status: DC
  Administered 2017-04-03 – 2017-04-05 (×3): 25 mg via ORAL
  Filled 2017-04-03 (×6): qty 1

## 2017-04-03 MED ORDER — ENSURE ENLIVE PO LIQD
237.0000 mL | Freq: Two times a day (BID) | ORAL | Status: DC
  Administered 2017-04-05: 237 mL via ORAL

## 2017-04-03 MED ORDER — INSULIN ASPART 100 UNIT/ML ~~LOC~~ SOLN: 0.0000 [IU] | Freq: Every day | SUBCUTANEOUS | Status: DC

## 2017-04-03 MED ORDER — METHYLPREDNISOLONE SODIUM SUCC 125 MG IJ SOLR
125.0000 mg | Freq: Once | INTRAMUSCULAR | Status: AC
  Administered 2017-04-03: 125 mg via INTRAVENOUS
  Filled 2017-04-03: qty 2

## 2017-04-03 MED ORDER — PANTOPRAZOLE SODIUM 40 MG IV SOLR
40.0000 mg | Freq: Two times a day (BID) | INTRAVENOUS | Status: DC
  Administered 2017-04-03 – 2017-04-05 (×4): 40 mg via INTRAVENOUS
  Filled 2017-04-03 (×4): qty 40

## 2017-04-03 MED ORDER — GABAPENTIN 300 MG PO CAPS
300.0000 mg | ORAL_CAPSULE | Freq: Three times a day (TID) | ORAL | Status: DC
  Administered 2017-04-03 – 2017-04-10 (×19): 300 mg via ORAL
  Filled 2017-04-03 (×20): qty 1

## 2017-04-03 MED ORDER — TRAMADOL HCL 50 MG PO TABS
50.0000 mg | ORAL_TABLET | Freq: Two times a day (BID) | ORAL | Status: DC | PRN
  Administered 2017-04-03 – 2017-04-05 (×2): 100 mg via ORAL
  Administered 2017-04-07 – 2017-04-08 (×2): 50 mg via ORAL
  Administered 2017-04-08 – 2017-04-10 (×3): 100 mg via ORAL
  Filled 2017-04-03: qty 2
  Filled 2017-04-03: qty 1
  Filled 2017-04-03 (×3): qty 2
  Filled 2017-04-03: qty 1
  Filled 2017-04-03: qty 2

## 2017-04-03 MED ORDER — SODIUM CHLORIDE 0.9 % IV SOLN
INTRAVENOUS | Status: DC
  Administered 2017-04-03: 1000 mL via INTRAVENOUS

## 2017-04-03 MED ORDER — ACETAMINOPHEN 650 MG RE SUPP: 650.0000 mg | Freq: Four times a day (QID) | RECTAL | Status: DC | PRN

## 2017-04-03 MED ORDER — ALPRAZOLAM 0.5 MG PO TABS
0.5000 mg | ORAL_TABLET | Freq: Three times a day (TID) | ORAL | Status: DC | PRN
  Administered 2017-04-06 – 2017-04-08 (×4): 0.5 mg via ORAL
  Filled 2017-04-03 (×4): qty 1

## 2017-04-03 MED ORDER — TRAZODONE HCL 50 MG PO TABS
50.0000 mg | ORAL_TABLET | Freq: Every evening | ORAL | Status: DC | PRN
  Administered 2017-04-05 – 2017-04-06 (×2): 50 mg via ORAL
  Filled 2017-04-03 (×2): qty 1

## 2017-04-03 MED ORDER — ONDANSETRON HCL 4 MG/2ML IJ SOLN
4.0000 mg | Freq: Once | INTRAMUSCULAR | Status: AC
  Administered 2017-04-03: 4 mg via INTRAVENOUS
  Filled 2017-04-03: qty 2

## 2017-04-03 MED ORDER — INSULIN GLARGINE 100 UNIT/ML ~~LOC~~ SOLN
17.0000 [IU] | Freq: Two times a day (BID) | SUBCUTANEOUS | Status: DC
  Administered 2017-04-03: 17 [IU] via SUBCUTANEOUS
  Filled 2017-04-03 (×3): qty 0.17

## 2017-04-03 MED ORDER — ZOLPIDEM TARTRATE 5 MG PO TABS
5.0000 mg | ORAL_TABLET | Freq: Every evening | ORAL | Status: DC | PRN
  Administered 2017-04-04 – 2017-04-09 (×4): 5 mg via ORAL
  Filled 2017-04-03 (×4): qty 1

## 2017-04-03 MED ORDER — ALBUTEROL SULFATE (2.5 MG/3ML) 0.083% IN NEBU
5.0000 mg | INHALATION_SOLUTION | Freq: Once | RESPIRATORY_TRACT | Status: AC
  Administered 2017-04-03: 5 mg via RESPIRATORY_TRACT
  Filled 2017-04-03: qty 6

## 2017-04-03 MED ORDER — TIOTROPIUM BROMIDE MONOHYDRATE 18 MCG IN CAPS: 18.0000 ug | ORAL_CAPSULE | Freq: Every day | RESPIRATORY_TRACT | Status: DC | PRN

## 2017-04-03 MED ORDER — ATORVASTATIN CALCIUM 40 MG PO TABS
40.0000 mg | ORAL_TABLET | Freq: Every day | ORAL | Status: DC
  Administered 2017-04-03 – 2017-04-09 (×7): 40 mg via ORAL
  Filled 2017-04-03 (×6): qty 1
  Filled 2017-04-03: qty 4

## 2017-04-03 MED ORDER — INSULIN ASPART 100 UNIT/ML ~~LOC~~ SOLN
8.0000 [IU] | Freq: Once | SUBCUTANEOUS | Status: AC
  Administered 2017-04-03: 8 [IU] via SUBCUTANEOUS

## 2017-04-03 MED ORDER — CITALOPRAM HYDROBROMIDE 40 MG PO TABS
40.0000 mg | ORAL_TABLET | Freq: Every day | ORAL | Status: DC
  Administered 2017-04-04 – 2017-04-10 (×7): 40 mg via ORAL
  Filled 2017-04-03 (×2): qty 1
  Filled 2017-04-03: qty 4
  Filled 2017-04-03: qty 1
  Filled 2017-04-03: qty 4
  Filled 2017-04-03: qty 1
  Filled 2017-04-03: qty 4
  Filled 2017-04-03: qty 1

## 2017-04-03 MED ORDER — ACETAMINOPHEN 325 MG PO TABS
650.0000 mg | ORAL_TABLET | Freq: Four times a day (QID) | ORAL | Status: DC | PRN
  Administered 2017-04-04 – 2017-04-10 (×3): 650 mg via ORAL
  Filled 2017-04-03 (×4): qty 2

## 2017-04-03 MED ORDER — INSULIN ASPART 100 UNIT/ML ~~LOC~~ SOLN: 0.0000 [IU] | Freq: Three times a day (TID) | SUBCUTANEOUS | Status: DC

## 2017-04-03 MED ORDER — TORSEMIDE 20 MG PO TABS
40.0000 mg | ORAL_TABLET | Freq: Every day | ORAL | Status: DC
  Administered 2017-04-04 – 2017-04-05 (×2): 40 mg via ORAL
  Filled 2017-04-03 (×2): qty 2

## 2017-04-03 MED ORDER — HYDRALAZINE HCL 20 MG/ML IJ SOLN: 5.0000 mg | INTRAMUSCULAR | Status: DC | PRN

## 2017-04-03 MED ORDER — IPRATROPIUM-ALBUTEROL 0.5-2.5 (3) MG/3ML IN SOLN: 3.0000 mL | RESPIRATORY_TRACT | Status: DC | PRN

## 2017-04-03 MED ORDER — MOMETASONE FURO-FORMOTEROL FUM 200-5 MCG/ACT IN AERO
2.0000 | INHALATION_SPRAY | Freq: Two times a day (BID) | RESPIRATORY_TRACT | Status: DC
  Administered 2017-04-03 – 2017-04-10 (×13): 2 via RESPIRATORY_TRACT
  Filled 2017-04-03 (×2): qty 8.8

## 2017-04-03 MED ORDER — ONDANSETRON HCL 4 MG PO TABS: 4.0000 mg | ORAL_TABLET | Freq: Four times a day (QID) | ORAL | Status: DC | PRN
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
## 2017-04-03 NOTE — Progress Notes (Signed)
Received patient at bedside report from [facility]. Reportedly pt arrived to the floor with 2 unit of blood hanging.  Blood finished, went to file green blood slip and discovered the consent was not in the chart. Called ED. RN reports he sent it but made a copy and will tube it. Awaiting.

## 2017-04-03 NOTE — Progress Notes (Signed)
Gave patient his medications. Hung normal saline at 50cc. Patient appears to  Be irritated over any thing, becomes agitated easily and is quite demanding

## 2017-04-03 NOTE — ED Triage Notes (Signed)
Patient from home with shortness of breath and generalized weakness since Thursday.  States he received 2 units of blood at [facility] due to anemia from a rectal bleed that started "two or three years ago".  Patient alert and oriented, speaking in complete sentences.  Denies pain. Patient refused all treatment from EMS other than CPaP and 5mg  albuterol en route.  Patient refuses transition to BiPaP on arrival to ED, states he did not want to wear mask.  Patient currently wearing 4L [place], O2 saturation 98%.  States he wears 7L O2 [place] at home.
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
## 2017-04-03 NOTE — Progress Notes (Addendum)
RT assessed the Pt and tried to get the Pt to wear a BIPAP. The Pt refused BIPAP and RT placed the Pt on 5L [place], SATS 100%. The Pt stated that he wears 7L [place] at home. RT asked the Pt if he had COPD and he stated yeas and emphysema. RT left the Pt on 5L . Pt is a CO2 retainer. Last ABG on 07/02/15. RT will continue to monitor.

## 2017-04-03 NOTE — ED Notes (Signed)
Patient denies pain and is resting comfortably.  

## 2017-04-03 NOTE — Progress Notes (Signed)
Patients CBG 542. Will page MD  [phone] [place], [place] CBG 542. Will order stat lab as per protocol. Will wait for further orders.
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
## 2017-04-03 NOTE — Progress Notes (Signed)
Spoke to Dr Lara Mulch. He ordered 8 Units of novolog and to give Lantus right now. Called pharmacy and  Requested lantus ASAP.

## 2017-04-04 DIAGNOSIS — Q2733 Arteriovenous malformation of digestive system vessel: Secondary | ICD-10-CM | POA: Diagnosis not present

## 2017-04-04 DIAGNOSIS — J439 Emphysema, unspecified: Secondary | ICD-10-CM | POA: Diagnosis not present

## 2017-04-04 DIAGNOSIS — D649 Anemia, unspecified: Secondary | ICD-10-CM | POA: Diagnosis not present

## 2017-04-04 DIAGNOSIS — K922 Gastrointestinal hemorrhage, unspecified: Secondary | ICD-10-CM | POA: Diagnosis not present

## 2017-04-04 DIAGNOSIS — R195 Other fecal abnormalities: Secondary | ICD-10-CM | POA: Diagnosis not present

## 2017-04-04 DIAGNOSIS — K31811 Angiodysplasia of stomach and duodenum with bleeding: Secondary | ICD-10-CM | POA: Diagnosis not present

## 2017-04-04 DIAGNOSIS — I10 Essential (primary) hypertension: Secondary | ICD-10-CM | POA: Diagnosis not present

## 2017-04-04 DIAGNOSIS — K921 Melena: Secondary | ICD-10-CM | POA: Diagnosis not present

## 2017-04-04 DIAGNOSIS — J9611 Chronic respiratory failure with hypoxia: Secondary | ICD-10-CM | POA: Diagnosis not present

## 2017-04-04 LAB — GLUCOSE, CAPILLARY
GLUCOSE-CAPILLARY: 304 mg/dL — AB (ref 65–99)
GLUCOSE-CAPILLARY: 373 mg/dL — AB (ref 65–99)
GLUCOSE-CAPILLARY: 449 mg/dL — AB (ref 65–99)
Glucose-Capillary: 322 mg/dL — ABNORMAL HIGH (ref 65–99)
Glucose-Capillary: 286 mg/dL — ABNORMAL HIGH (ref 65–99)

## 2017-04-04 LAB — BASIC METABOLIC PANEL
ANION GAP: 10 (ref 5–15)
BUN: 49 mg/dL — ABNORMAL HIGH (ref 6–20)
CO2: 24 mmol/L (ref 22–32)
Calcium: 8.7 mg/dL — ABNORMAL LOW (ref 8.9–10.3)
Chloride: 98 mmol/L — ABNORMAL LOW (ref 101–111)
Creatinine, Ser: 1.34 mg/dL — ABNORMAL HIGH (ref 0.61–1.24)
GFR, EST AFRICAN AMERICAN: 58 mL/min — AB (ref >60)
GFR, EST NON AFRICAN AMERICAN: 50 mL/min — AB (ref >60)
GLUCOSE: 315 mg/dL — AB (ref 65–99)
POTASSIUM: 5.4 mmol/L — AB (ref 3.5–5.1)
SODIUM: 132 mmol/L — AB (ref 135–145)

## 2017-04-04 LAB — CBC
HEMATOCRIT: 27.4 % — AB (ref 39.0–52.0)
Hemoglobin: 8.2 g/dL — ABNORMAL LOW (ref 13.0–17.0)
MCH: 25.8 pg — ABNORMAL LOW (ref 26.0–34.0)
MCHC: 29.9 g/dL — ABNORMAL LOW (ref 30.0–36.0)
MCV: 86.2 fL (ref 78.0–100.0)
Platelets: 223 10*3/uL (ref 150–400)
RBC: 3.18 MIL/uL — ABNORMAL LOW (ref 4.22–5.81)
RDW: 19.6 % — AB (ref 11.5–15.5)
WBC: 16.8 10*3/uL — AB (ref 4.0–10.5)

## 2017-04-04 LAB — PREPARE RBC (CROSSMATCH)

## 2017-04-04 MED ORDER — INSULIN GLARGINE 100 UNIT/ML ~~LOC~~ SOLN: 24.0000 [IU] | Freq: Two times a day (BID) | SUBCUTANEOUS | Status: DC

## 2017-04-04 MED ORDER — INSULIN GLARGINE 100 UNIT/ML ~~LOC~~ SOLN
20.0000 [IU] | Freq: Two times a day (BID) | SUBCUTANEOUS | Status: DC
  Filled 2017-04-04: qty 0.2

## 2017-04-04 MED ORDER — SODIUM POLYSTYRENE SULFONATE 15 GM/60ML PO SUSP
30.0000 g | Freq: Once | ORAL | Status: AC
  Administered 2017-04-04: 30 g via ORAL
  Filled 2017-04-04: qty 120

## 2017-04-04 MED ORDER — SODIUM CHLORIDE 0.9 % IV SOLN
Freq: Once | INTRAVENOUS | Status: AC
  Administered 2017-04-05: via INTRAVENOUS

## 2017-04-04 MED ORDER — INSULIN ASPART 100 UNIT/ML ~~LOC~~ SOLN
0.0000 [IU] | Freq: Three times a day (TID) | SUBCUTANEOUS | Status: DC
  Administered 2017-04-04: 8 [IU] via SUBCUTANEOUS
  Administered 2017-04-05 (×2): 3 [IU] via SUBCUTANEOUS
  Administered 2017-04-05: 5 [IU] via SUBCUTANEOUS
  Administered 2017-04-06: 3 [IU] via SUBCUTANEOUS
  Administered 2017-04-06: 8 [IU] via SUBCUTANEOUS
  Administered 2017-04-07: 3 [IU] via SUBCUTANEOUS
  Administered 2017-04-07 – 2017-04-08 (×2): 2 [IU] via SUBCUTANEOUS
  Administered 2017-04-08: 3 [IU] via SUBCUTANEOUS
  Administered 2017-04-08 – 2017-04-09 (×2): 5 [IU] via SUBCUTANEOUS
  Administered 2017-04-10: 8 [IU] via SUBCUTANEOUS
  Administered 2017-04-10: 3 [IU] via SUBCUTANEOUS

## 2017-04-04 MED ORDER — TRAMADOL HCL 50 MG PO TABS
50.0000 mg | ORAL_TABLET | Freq: Once | ORAL | Status: AC
  Administered 2017-04-04: 50 mg via ORAL
  Filled 2017-04-04: qty 1

## 2017-04-04 MED ORDER — INSULIN ASPART 100 UNIT/ML ~~LOC~~ SOLN
0.0000 [IU] | Freq: Every day | SUBCUTANEOUS | Status: DC
  Administered 2017-04-04: 4 [IU] via SUBCUTANEOUS
  Administered 2017-04-05: 2 [IU] via SUBCUTANEOUS
  Administered 2017-04-06: 3 [IU] via SUBCUTANEOUS
  Administered 2017-04-07: 2 [IU] via SUBCUTANEOUS
  Administered 2017-04-09: 3 [IU] via SUBCUTANEOUS

## 2017-04-04 MED ORDER — SODIUM CHLORIDE 0.9 % IV SOLN
510.0000 mg | Freq: Once | INTRAVENOUS | Status: AC
  Administered 2017-04-04: 510 mg via INTRAVENOUS
  Filled 2017-04-04: qty 17

## 2017-04-04 MED ORDER — INSULIN ASPART 100 UNIT/ML ~~LOC~~ SOLN: 0.0000 [IU] | SUBCUTANEOUS | Status: DC

## 2017-04-04 MED ORDER — INSULIN GLARGINE 100 UNIT/ML ~~LOC~~ SOLN
24.0000 [IU] | Freq: Two times a day (BID) | SUBCUTANEOUS | Status: DC
  Filled 2017-04-04: qty 0.24

## 2017-04-04 MED ORDER — INSULIN ASPART 100 UNIT/ML ~~LOC~~ SOLN
0.0000 [IU] | SUBCUTANEOUS | Status: DC
  Administered 2017-04-04: 9 [IU] via SUBCUTANEOUS
  Administered 2017-04-04: 7 [IU] via SUBCUTANEOUS

## 2017-04-04 MED ORDER — INSULIN GLARGINE 100 UNIT/ML ~~LOC~~ SOLN
24.0000 [IU] | Freq: Two times a day (BID) | SUBCUTANEOUS | Status: DC
  Administered 2017-04-04 (×2): 24 [IU] via SUBCUTANEOUS
  Filled 2017-04-04 (×3): qty 0.24

## 2017-04-04 NOTE — Progress Notes (Signed)
Sent the following message to Dr Lara Mulch:  3E74 [name]: Per your request, Rechecked CBG: 448. He is now NPO. Thank you.

## 2017-04-04 NOTE — Progress Notes (Signed)
Nutrition Brief Note  Patient identified on the Malnutrition Screening Tool (MST) Report  Wt Readings from Last 15 Encounters:  04/03/17 260 lb 3.2 oz (118 kg)  03/30/17 257 lb 8 oz (116.8 kg)  02/22/17 256 lb (116.1 kg)  11/16/16 255 lb (115.7 kg)  11/08/16 258 lb (117 kg)  10/24/16 258 lb (117 kg)  10/09/16 270 lb (122.5 kg)  08/02/16 267 lb (121.1 kg)  07/26/16 283 lb (128.4 kg)  02/10/16 259 lb (117.5 kg)  10/25/15 265 lb (120.2 kg)  09/17/15 268 lb (121.6 kg)  08/20/15 289 lb 3.2 oz (131.2 kg)  07/27/15 272 lb (123.4 kg)  07/03/15 271 lb 1.6 oz (123 kg)   [name] is a 76 y.o. male with medical history significant of diabetes, insomnia, hyperlipidemia, AAA, recurrent GI bleeds with history of AVM and duodenal ulceration, bipolar, CAD/MI/CABG, COPD, depression, hypertension, GERD. Patient presenting with recurrent GI bleed and associated symptoms.   Body mass index is 32.52 kg/m. Patient meets criteria for obesity, class I based on current BMI.   Current diet order is Carb Modified, patient is consuming approximately 75% of meals at this time. Labs and medications reviewed.   No nutrition interventions warranted at this time. If nutrition issues arise, please consult RD.   Keymiah Lyles A. Jimmye Norman, RD, LDN, CDE Pager: [phone] After hours Pager: [phone]

## 2017-04-04 NOTE — Progress Notes (Signed)
Patient complaining of 7/10 pain in neck and shoulder. Offered heating pack, which he say  5W 02 [name], [name] is agitated.Reports pain is 7/10 and he needs another tramadol. Refused heat pack. Thank you.s "won't help" He says "I take another tramadol, and it works. Will page Dr Lara Mulch.Returned page. Ordered 50 mg tramadol as a one time dose.

## 2017-04-04 NOTE — Progress Notes (Addendum)
PROGRESS NOTE                                                                                                                                                                                                             Patient Demographics:    [name], is a 76 y.o. male, DOB - 06/04/1941, HLK:[id number]  Admit date - 04/03/2017   Admitting Physician Waldemar Dickens, MD  Outpatient Primary MD for the patient is [name], MD  LOS - 0   Chief Complaint  Patient presents with  . Shortness of Breath       Brief Narrative   76 y.o. male with medical history significant of diabetes, insomnia, hyperlipidemia, AAA, recurrent GI bleeds with history of AVM and duodenal ulceration, bipolar, CAD/MI/CABG, COPD, depression, hypertension, GERD. Patient presenting with recurrent GI bleed   Subjective:    [name] today Reports normal colored bowel movement overnight, denies any nausea, vomiting, blood in stool .   Assessment  & Plan :    Active Problems:   Hyperlipidemia   Essential hypertension   COPD (chronic obstructive pulmonary disease) (HCC)   Chronic GI bleeding   Chronic hypoxemic respiratory failure (HCC)   Insomnia   Gastric AVM   AVM (arteriovenous malformation) of duodenum, acquired with hemorrhage   Symptomatic anemia   GIB (gastrointestinal bleeding)     GI bleed - Patient with known history of recurrent GI bleeds in the past secondary to known GI tract AVM mainly gastric and duodenal. - Presents with hemoglobin of 8, transfused 1 unit PRBC, hemoglobin improved day to 9.3.Continue to monitor H&H closely, will repeat H&H today, repeat in 24 hours to ensure stable hemoglobin after transfusion - Seen by GI, at this point endoscopic procedure is not indicated, unless he has overt GI bleed as he is high risk for sedation related complications, continue with supportive care, transfuse as needed. - Continue with PPI - GI  signed off.  Symptomatic anemia:  - Iron deficiency anemia secondary to acute on chronic blood loss . - Patient received 2 units PRBC, hemoglobin is 9.3. - Workup significant for iron deficiency anemia with normal iron and ferritin level, will give IV for him today. - Check CBC in a.m.Marland Kitchen  Hypokalemia - Patient on telemetry, will give 30 g of Kayexalate  COPD:  - Advanced. On  7L Norco at baseline. Followed by Dr. Lamonte Sakai of pulmonlogy. Treated w/ solumedrol and nebulizers upon arrival due to concerns of COPD exacerbation.  - continue home O2 of 4-7LNC - No further steroids - continue Dulera, Spiriva  CKD:  - baseline Cf 1.15. Currently 1.3.    Abd pain:  - likely from GI bleed. Doubt infectious process but cannot entirely exclude. WBC 17.2. AFVSS.  - KUB - Treatment as above.   Chest pain:  -Troponin negative on admission, currently denies any chest pain  HTN: - continue metoprolol, lisinopril  DM: - Poorly controlled, will increase Lantus from 17-24 units twice a day, continue with insulin sliding scale, continue to hold metformin    CHF: - continue torsemide  Depression: - continue xanax, Celexa  HLD: - continue lipitor  Peripheral neuropathy - continue neurontin  Insomnia: - continue trazodone, Ambien  Chronic pain: - continue tramadol    Code Status : Full  Family Communication  : None at bedside  Disposition Plan  : home when stable  Consults  :  GI  Procedures  : None  DVT Prophylaxis  :  SCDs   Lab Results  Component Value Date   PLT 295 04/03/2017    Antibiotics  :   Anti-infectives    None        Objective:   Vitals:   04/03/17 2332 04/04/17 0616 04/04/17 0821 04/04/17 0857  BP:  (!) 133/53  112/61  Pulse: (!) 101 86  78  Resp: 18 18    Temp:  97.8 F (36.6 C)    TempSrc:  Oral    SpO2: 95% 99% 98%   Weight:      Height:        Wt Readings from Last 3 Encounters:  04/03/17 118 kg (260 lb 3.2 oz)  03/30/17  116.8 kg (257 lb 8 oz)  02/22/17 116.1 kg (256 lb)     Intake/Output Summary (Last 24 hours) at 04/04/17 1232 Last data filed at 04/04/17 0600  Gross per 24 hour  Intake             1431 ml  Output              425 ml  Net             1006 ml     Physical Exam  Awake Alert, Oriented X 3,  Symmetrical Chest wall movement, Good air movement bilaterally, CTAB RRR,No Gallops,Rubs or new Murmurs, No Parasternal Heave +ve B.Sounds, Abd Soft, No tenderness,  No rebound - guarding or rigidity. No Cyanosis, Clubbing or edema, No new Rash or bruise     Data Review:    CBC  Recent Labs Lab 03/30/17 1430 03/31/17 0820 04/03/17 1201 04/03/17 2221  WBC 9.7 7.0 17.2*  --   HGB 6.2* 8.0* 8.0* 9.3*  HCT 21.2* 26.3* 27.7* 31.0*  PLT 296 224 295  --   MCV 83.5 84.6 87.9  --   MCH 24.4* 25.7* 25.4*  --   MCHC 29.2* 30.4 28.9*  --   RDW 17.3* 16.4* 17.4*  --   LYMPHSABS 1.2  --  2.4  --   MONOABS 0.7  --  1.2*  --   EOSABS 0.2  --  0.2  --   BASOSABS 0.0  --  0.0  --     Chemistries   Recent Labs Lab 03/30/17 1430 03/30/17 2028 03/31/17 0820 04/03/17 1201 04/03/17 2221 04/04/17 0736  NA 138 137 134*  137  --  132*  K 5.3* 4.9 4.6 4.9  --  5.4*  CL 100* 100* 97* 97*  --  98*  CO2 30 30 28 31   --  24  GLUCOSE 196* 191* 227* 261* 517* 315*  BUN 17 16 17  45*  --  49*  CREATININE 1.14 1.15 1.22 1.33*  --  1.34*  CALCIUM 8.5* 8.6* 8.3* 8.8*  --  8.7*  AST 14*  --  16 13*  --   --   ALT 11*  --  11* 10*  --   --   ALKPHOS 130*  --  129* 116  --   --   BILITOT 0.4  --  0.7 0.6  --   --    ------------------------------------------------------------------------------------------------------------------ No results for input(s): CHOL, HDL, LDLCALC, TRIG, CHOLHDL, LDLDIRECT in the last 72 hours.  Lab Results  Component Value Date   HGBA1C 7.8 (H) 02/23/2017    ------------------------------------------------------------------------------------------------------------------ No results for input(s): TSH, T4TOTAL, T3FREE, THYROIDAB in the last 72 hours.  Invalid input(s): FREET3 ------------------------------------------------------------------------------------------------------------------ No results for input(s): VITAMINB12, FOLATE, FERRITIN, TIBC, IRON, RETICCTPCT in the last 72 hours.  Coagulation profile  Recent Labs Lab 03/30/17 1552  INR 1.03    No results for input(s): DDIMER in the last 72 hours.  Cardiac Enzymes  Recent Labs Lab 04/03/17 1201  TROPONINI <0.03   ------------------------------------------------------------------------------------------------------------------    Component Value Date/Time   BNP 84.2 04/03/2017 1201    Inpatient Medications  Scheduled Meds: . atorvastatin  40 mg Oral QHS  . citalopram  40 mg Oral Daily  . feeding supplement (ENSURE ENLIVE)  237 mL Oral BID BM  . gabapentin  300 mg Oral TID  . insulin aspart  0-15 Units Subcutaneous TID WC  . insulin aspart  0-5 Units Subcutaneous QHS  . insulin glargine  24 Units Subcutaneous BID  . lisinopril  2.5 mg Oral Daily  . metoprolol tartrate  25 mg Oral BID  . mometasone-formoterol  2 puff Inhalation BID  . pantoprazole (PROTONIX) IV  40 mg Intravenous Q12H  . sodium polystyrene  30 g Oral Once  . torsemide  40 mg Oral Daily   Continuous Infusions: . sodium chloride    . sodium chloride 1,000 mL (04/03/17 2139)  . ferumoxytol     PRN Meds:.acetaminophen **OR** acetaminophen, ALPRAZolam, hydrALAZINE, ipratropium-albuterol, ondansetron **OR** ondansetron (ZOFRAN) IV, tiotropium, traMADol, traZODone, zolpidem  Micro Results No results found for this or any previous visit (from the past 240 hour(s)).  Radiology Reports Dg Chest 2 View  Result Date: 04/03/2017 CLINICAL DATA:  Shortness of breath. EXAM: CHEST  2 VIEW COMPARISON:   Radiographs of March 30, 2017. FINDINGS: The heart size and mediastinal contours are within normal limits. Both lungs are clear. Status post coronary artery bypass graft. No pneumothorax or pleural effusion is noted. Large hiatal hernia is noted. The visualized skeletal structures are unremarkable. IMPRESSION: No active cardiopulmonary disease.  Large hiatal hernia. Electronically Signed   By: Marijo Conception, M.D.   On: 04/03/2017 11:11   Dg Chest 2 View  Result Date: 03/30/2017 CLINICAL DATA:  Shortness of breath EXAM: CHEST  2 VIEW COMPARISON:  02/22/2017 FINDINGS: Previous median sternotomy and CABG procedure. Normal heart size. No pleural effusion or edema identified. No airspace consolidation identified. IMPRESSION: 1. No acute cardiopulmonary abnormalities. 2. Hiatal hernia. Electronically Signed   By: Kerby Moors M.D.   On: 03/30/2017 13:58   Dg Abd 1 View  Result Date: 04/03/2017 CLINICAL  DATA:  Intermittent left lower quadrant abdominal pain. EXAM: ABDOMEN - 1 VIEW COMPARISON:  CT abdomen pelvis 04/22/2015. CT abdomen and pelvis 12/29/2014, 05/19/2014 and earlier. FINDINGS: Bowel gas pattern unremarkable without evidence of obstruction or significant ileus. Moderate stool burden in the colon. No visible opaque urinary tract calculi. Prior aorto iliac stent grafting and coil embolization of the right internal iliac artery. Surgical suture material in the pelvis. IMPRESSION: No acute abdominal abnormality. Electronically Signed   By: Evangeline Dakin M.D.   On: 04/03/2017 18:13     Kadence Mikkelson M.D on 04/04/2017 at 12:32 PM  Between 7am to 7pm - Pager - [phone]  After 7pm go to [email or web address] - password [facility]  [facility] -  Office  [phone]

## 2017-04-04 NOTE — Progress Notes (Signed)
PT Cancellation Note  Patient Details Name: [name] MRN: [id number] DOB: [birth year]   Cancelled Treatment:    Reason Eval/Treat Not Completed: Other (comment) Pt refusing secondary to concerns with mobilizing while receiving iron. Educated that we could still mobilize with iron, however, pt adamantly refusing and becoming irritable. Asked if PT could come back later, and educated that I would try, but could not guarantee given current schedule. Pt very irritable stating "Well you just go see all the other patients." Will check back as schedule allows.   Leighton Ruff, PT, DPT  Acute Rehabilitation Services  Pager: [phone]    Rudean Hitt 04/04/2017, 2:02 PM

## 2017-04-04 NOTE — Care Management Obs Status (Signed)
[place] NOTIFICATION   Patient Details  Name: KWAKU MOSTAFA MRN: [id number] Date of Birth: May 05, 1941   Medicare Observation Status Notification Given:  Yes    Carles Collet, RN 04/04/2017, 1:19 PM

## 2017-04-05 DIAGNOSIS — K922 Gastrointestinal hemorrhage, unspecified: Secondary | ICD-10-CM | POA: Diagnosis present

## 2017-04-05 DIAGNOSIS — D500 Iron deficiency anemia secondary to blood loss (chronic): Secondary | ICD-10-CM | POA: Diagnosis not present

## 2017-04-05 DIAGNOSIS — K3189 Other diseases of stomach and duodenum: Secondary | ICD-10-CM | POA: Diagnosis not present

## 2017-04-05 DIAGNOSIS — E785 Hyperlipidemia, unspecified: Secondary | ICD-10-CM | POA: Diagnosis present

## 2017-04-05 DIAGNOSIS — D62 Acute posthemorrhagic anemia: Secondary | ICD-10-CM | POA: Diagnosis not present

## 2017-04-05 DIAGNOSIS — J9611 Chronic respiratory failure with hypoxia: Secondary | ICD-10-CM | POA: Diagnosis not present

## 2017-04-05 DIAGNOSIS — I252 Old myocardial infarction: Secondary | ICD-10-CM | POA: Diagnosis not present

## 2017-04-05 DIAGNOSIS — E1122 Type 2 diabetes mellitus with diabetic chronic kidney disease: Secondary | ICD-10-CM | POA: Diagnosis present

## 2017-04-05 DIAGNOSIS — K31811 Angiodysplasia of stomach and duodenum with bleeding: Secondary | ICD-10-CM | POA: Diagnosis present

## 2017-04-05 DIAGNOSIS — Z9981 Dependence on supplemental oxygen: Secondary | ICD-10-CM | POA: Diagnosis not present

## 2017-04-05 DIAGNOSIS — I5032 Chronic diastolic (congestive) heart failure: Secondary | ICD-10-CM | POA: Diagnosis present

## 2017-04-05 DIAGNOSIS — I130 Hypertensive heart and chronic kidney disease with heart failure and stage 1 through stage 4 chronic kidney disease, or unspecified chronic kidney disease: Secondary | ICD-10-CM | POA: Diagnosis present

## 2017-04-05 DIAGNOSIS — E1165 Type 2 diabetes mellitus with hyperglycemia: Secondary | ICD-10-CM | POA: Diagnosis present

## 2017-04-05 DIAGNOSIS — K5520 Angiodysplasia of colon without hemorrhage: Secondary | ICD-10-CM | POA: Diagnosis present

## 2017-04-05 DIAGNOSIS — G629 Polyneuropathy, unspecified: Secondary | ICD-10-CM | POA: Diagnosis present

## 2017-04-05 DIAGNOSIS — E875 Hyperkalemia: Secondary | ICD-10-CM | POA: Diagnosis present

## 2017-04-05 DIAGNOSIS — K317 Polyp of stomach and duodenum: Secondary | ICD-10-CM | POA: Diagnosis present

## 2017-04-05 DIAGNOSIS — J439 Emphysema, unspecified: Secondary | ICD-10-CM | POA: Diagnosis not present

## 2017-04-05 DIAGNOSIS — G4700 Insomnia, unspecified: Secondary | ICD-10-CM | POA: Diagnosis present

## 2017-04-05 DIAGNOSIS — I10 Essential (primary) hypertension: Secondary | ICD-10-CM | POA: Diagnosis not present

## 2017-04-05 DIAGNOSIS — K921 Melena: Secondary | ICD-10-CM | POA: Diagnosis not present

## 2017-04-05 DIAGNOSIS — D124 Benign neoplasm of descending colon: Secondary | ICD-10-CM | POA: Diagnosis not present

## 2017-04-05 DIAGNOSIS — K210 Gastro-esophageal reflux disease with esophagitis: Secondary | ICD-10-CM | POA: Diagnosis present

## 2017-04-05 DIAGNOSIS — D125 Benign neoplasm of sigmoid colon: Secondary | ICD-10-CM | POA: Diagnosis not present

## 2017-04-05 DIAGNOSIS — I2510 Atherosclerotic heart disease of native coronary artery without angina pectoris: Secondary | ICD-10-CM | POA: Diagnosis present

## 2017-04-05 DIAGNOSIS — F3162 Bipolar disorder, current episode mixed, moderate: Secondary | ICD-10-CM | POA: Diagnosis present

## 2017-04-05 DIAGNOSIS — R000 Tachycardia, unspecified: Secondary | ICD-10-CM | POA: Diagnosis present

## 2017-04-05 DIAGNOSIS — N183 Chronic kidney disease, stage 3 (moderate): Secondary | ICD-10-CM | POA: Diagnosis present

## 2017-04-05 DIAGNOSIS — D649 Anemia, unspecified: Secondary | ICD-10-CM | POA: Diagnosis present

## 2017-04-05 DIAGNOSIS — Q2733 Arteriovenous malformation of digestive system vessel: Secondary | ICD-10-CM | POA: Diagnosis not present

## 2017-04-05 LAB — TYPE AND SCREEN
ABO/RH(D): B NEG
Antibody Screen: NEGATIVE
UNIT DIVISION: 0
UNIT DIVISION: 0
Unit division: 0

## 2017-04-05 LAB — BASIC METABOLIC PANEL
ANION GAP: 11 (ref 5–15)
BUN: 47 mg/dL — ABNORMAL HIGH (ref 6–20)
CO2: 26 mmol/L (ref 22–32)
Calcium: 8.2 mg/dL — ABNORMAL LOW (ref 8.9–10.3)
Chloride: 97 mmol/L — ABNORMAL LOW (ref 101–111)
Creatinine, Ser: 1.44 mg/dL — ABNORMAL HIGH (ref 0.61–1.24)
GFR calc non Af Amer: 46 mL/min — ABNORMAL LOW (ref >60)
GFR, EST AFRICAN AMERICAN: 53 mL/min — AB (ref >60)
Glucose, Bld: 241 mg/dL — ABNORMAL HIGH (ref 65–99)
Potassium: 3.9 mmol/L (ref 3.5–5.1)
Sodium: 134 mmol/L — ABNORMAL LOW (ref 135–145)

## 2017-04-05 LAB — BPAM RBC
BLOOD PRODUCT EXPIRATION DATE: 201806282359
Blood Product Expiration Date: 201806262359
Blood Product Expiration Date: 201806282359
ISSUE DATE / TIME: 201806181540
ISSUE DATE / TIME: 201806192337
ISSUE DATE / TIME: 201806181802
UNIT TYPE AND RH: 1700
UNIT TYPE AND RH: 1700
Unit Type and Rh: 1700

## 2017-04-05 LAB — HEMOGLOBIN AND HEMATOCRIT, BLOOD
HCT: 32.1 % — ABNORMAL LOW (ref 39.0–52.0)
HEMOGLOBIN: 9.8 g/dL — AB (ref 13.0–17.0)

## 2017-04-05 LAB — GLUCOSE, CAPILLARY
GLUCOSE-CAPILLARY: 206 mg/dL — AB (ref 65–99)
GLUCOSE-CAPILLARY: 182 mg/dL — AB (ref 65–99)
GLUCOSE-CAPILLARY: 235 mg/dL — AB (ref 65–99)
Glucose-Capillary: 186 mg/dL — ABNORMAL HIGH (ref 65–99)

## 2017-04-05 LAB — CBC
HEMATOCRIT: 29 % — AB (ref 39.0–52.0)
HEMOGLOBIN: 8.9 g/dL — AB (ref 13.0–17.0)
MCH: 27 pg (ref 26.0–34.0)
MCHC: 30.7 g/dL (ref 30.0–36.0)
MCV: 87.9 fL (ref 78.0–100.0)
Platelets: 175 10*3/uL (ref 150–400)
RBC: 3.3 MIL/uL — ABNORMAL LOW (ref 4.22–5.81)
RDW: 19.3 % — AB (ref 11.5–15.5)
WBC: 10.7 10*3/uL — AB (ref 4.0–10.5)

## 2017-04-05 MED ORDER — INSULIN GLARGINE 100 UNIT/ML ~~LOC~~ SOLN
28.0000 [IU] | Freq: Two times a day (BID) | SUBCUTANEOUS | Status: DC
  Administered 2017-04-05 – 2017-04-10 (×10): 28 [IU] via SUBCUTANEOUS
  Filled 2017-04-05 (×12): qty 0.28

## 2017-04-05 MED ORDER — PANTOPRAZOLE SODIUM 40 MG PO TBEC
40.0000 mg | DELAYED_RELEASE_TABLET | Freq: Two times a day (BID) | ORAL | Status: DC
  Administered 2017-04-05 – 2017-04-10 (×10): 40 mg via ORAL
  Filled 2017-04-05 (×10): qty 1

## 2017-04-05 MED ORDER — INSULIN ASPART 100 UNIT/ML ~~LOC~~ SOLN
5.0000 [IU] | Freq: Three times a day (TID) | SUBCUTANEOUS | Status: DC
  Administered 2017-04-05 – 2017-04-10 (×13): 5 [IU] via SUBCUTANEOUS

## 2017-04-05 MED ORDER — GLUCERNA SHAKE PO LIQD
237.0000 mL | Freq: Three times a day (TID) | ORAL | Status: DC
  Administered 2017-04-05: 237 mL via ORAL

## 2017-04-05 NOTE — Evaluation (Addendum)
Physical Therapy Evaluation Patient Details Name: [name] MRN: [id number] DOB: Dec 22, 1940 Today's Date: 04/05/2017   History of Present Illness  Pt is a 76 y/o male admitted secondary to GI bleed. 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  Clinical Impression  Pt presented supine in bed with HOB elevated, awake and willing to participate in therapy session. Prior to admission, pt reported that he could ambulate very short distances without an AD but would fatigue very quickly. Pt stated that he could dress himself and required assistance with sponge bathing. Pt tolerated transfers and ambulated a short distance with use of RW. Pt was on 4L of supplemental O2 with SPO2 maintaining >94% throughout. Pt would continue to benefit from skilled physical therapy services at this time while admitted and after d/c to address the below listed limitations in order to improve overall safety and independence with functional mobility.     Follow Up Recommendations Home health PT;Supervision for mobility/OOB    Equipment Recommendations  None recommended by PT    Recommendations for Other Services       Precautions / Restrictions Precautions Precautions: Fall Restrictions Weight Bearing Restrictions: No      Mobility  Bed Mobility Overal bed mobility: Modified Independent             General bed mobility comments: with use of bed rail  Transfers Overall transfer level: Needs assistance Equipment used: Rolling walker (2 wheeled) Transfers: Sit to/from Stand Sit to Stand: Min guard         General transfer comment: increased time, good technique, min guard for safety  Ambulation/Gait Ambulation/Gait assistance: Min guard Ambulation Distance (Feet): 5 Feet Assistive device: Rolling walker (2 wheeled) Gait Pattern/deviations: Decreased step length - right;Decreased step length - left;Decreased  stride length;Step-through pattern Gait velocity: decreased Gait velocity interpretation: Below normal speed for age/gender General Gait Details: pt on 4L of supplemental O2 with SPO2 maintaining >94% throughout  Stairs            Wheelchair Mobility    Modified Rankin (Stroke Patients Only)       Balance Overall balance assessment: History of Falls;Needs assistance Sitting-balance support: Feet supported Sitting balance-Leahy Scale: Good     Standing balance support: During functional activity;Bilateral upper extremity supported Standing balance-Leahy Scale: Poor Standing balance comment: pt reliant on bilateral UEs on RW                             Pertinent Vitals/Pain Pain Assessment: No/denies pain    Home Living Family/patient expects to be discharged to:: Private residence Living Arrangements: Spouse/significant other Available Help at Discharge: Family Type of Home: House Home Access: Stairs to enter Entrance Stairs-Rails: Psychiatric nurse of Steps: 5 Home Layout: One level Home Equipment: Environmental consultant - 2 wheels;Bedside commode;Cane - quad;Shower seat - built in;Hospital bed      Prior Function Level of Independence: Needs assistance   Gait / Transfers Assistance Needed: pt was ambulating short distances within his home without use of an AD  ADL's / Homemaking Assistance Needed: Pt reported that he can dress himself but requires assistance with sponge bathing        Hand Dominance        Extremity/Trunk Assessment   Upper Extremity Assessment Upper Extremity Assessment: Overall WFL for tasks assessed    Lower Extremity Assessment Lower Extremity Assessment:  Overall WFL for tasks assessed       Communication   Communication: No difficulties  Cognition Arousal/Alertness: Awake/alert Behavior During Therapy: WFL for tasks assessed/performed Overall Cognitive Status: Within Functional Limits for tasks assessed                                         General Comments      Exercises     Assessment/Plan    PT Assessment Patient needs continued PT services  PT Problem List Decreased activity tolerance;Cardiopulmonary status limiting activity;Decreased balance;Decreased mobility;Decreased coordination       PT Treatment Interventions Gait training;DME instruction;Functional mobility training;Balance training;Therapeutic exercise;Patient/family education;Stair training;Therapeutic activities;Neuromuscular re-education    PT Goals (Current goals can be found in the Care Plan section)  Acute Rehab PT Goals Patient Stated Goal: to be able to walk the length of his driveway and to be able to go out to dinner with his wife PT Goal Formulation: With patient Time For Goal Achievement: 04/19/17 Potential to Achieve Goals: Good    Frequency Min 3X/week   Barriers to discharge        Co-evaluation               AM-PAC PT "6 Clicks" Daily Activity  Outcome Measure Difficulty turning over in bed (including adjusting bedclothes, sheets and blankets)?: None Difficulty moving from lying on back to sitting on the side of the bed? : None Difficulty sitting down on and standing up from a chair with arms (e.g., wheelchair, bedside commode, etc,.)?: Total Help needed moving to and from a bed to chair (including a wheelchair)?: A Little Help needed walking in hospital room?: A Little Help needed climbing 3-5 steps with a railing? : A Little 6 Click Score: 18    End of Session Equipment Utilized During Treatment: Gait belt;Oxygen Activity Tolerance: Patient tolerated treatment well Patient left: in chair;with call bell/phone within reach;with chair alarm set Nurse Communication: Mobility status PT Visit Diagnosis: Repeated falls (R29.6);Other abnormalities of gait and mobility (R26.89)    Time: 9201-0071 PT Time Calculation (min) (ACUTE ONLY): 26 min   Charges:   PT  Evaluation $PT Eval Moderate Complexity: 1 Procedure PT Treatments $Therapeutic Activity: 8-22 mins   PT G Codes:        [place], PT, DPT [phone]   [place] 04/05/2017, 2:33 PM

## 2017-04-05 NOTE — Progress Notes (Signed)
Pt was upset this morning about not being able to find his tv guide. RN went in to assist him- pt starting arguing with nurse stating that he needed his tv guide to watch the news. RN tried to tell patient that she understood and would look for it but pt became upset. Outside of the room, phlebotomist stated that pt was rude to her and would not corroborate at first as well. Another RN tried speaking to pt about the tv guide- pt settled a bit but was still frustrated. Pt is now calm and back asleep- will continue to monitor

## 2017-04-05 NOTE — Progress Notes (Signed)
PROGRESS NOTE                                                                                                                                                                                                             Patient Demographics:    [name], is a 76 y.o. male, DOB - [birth year], RDE:[id number]  Admit date - 04/03/2017   Admitting Physician Waldemar Dickens, MD  Outpatient Primary MD for the patient is [name], MD  LOS - 0   Chief Complaint  Patient presents with  . Shortness of Breath       Brief Narrative   76 y.o. male with medical history significant of diabetes, insomnia, hyperlipidemia, AAA, recurrent GI bleeds with history of AVM and duodenal ulceration, bipolar, CAD/MI/CABG, COPD, depression, hypertension, GERD. Patient presenting with recurrent GI bleed   Subjective:    [name] had one episode significant melena yesterday, report some generalized weakness, but denies nausea, vomiting or abdominal pain .   Assessment  & Plan :    Active Problems:   Hyperlipidemia   Essential hypertension   COPD (chronic obstructive pulmonary disease) (HCC)   Chronic GI bleeding   Chronic hypoxemic respiratory failure (HCC)   Insomnia   Gastric AVM   AVM (arteriovenous malformation) of duodenum, acquired with hemorrhage   Symptomatic anemia   GIB (gastrointestinal bleeding)     GI bleed - Patient with known history of recurrent GI bleeds in the past secondary to known GI tract AVM mainly gastric and duodenal. - Presents with hemoglobin of 8, transfused 1 unit PRBC, hemoglobin improved day to 9.3.Patient had one episode of melena yesterday afternoon, with drop in his hemoglobin to 8.2, he was transfused 1 unit PRBC 6/19, hemoglobin has improved to 8.9 this a.m. Marland Kitchen - Seen by GI, patient currently with evidence of active GI bleed, requested GI to evaluate if there is any further workup indicated at this point, we'll  continue to monitor H&H closely and transfuse as needed, continue with PPI   Symptomatic anemia:  - Iron deficiency anemia secondary to acute on chronic blood loss . - Patient received 2 units PRBC 6/18, and 1 unit PRBC on 6/19, hemoglobin is 8.9 today. - Workup significant for iron deficiency anemia withlow iron and ferritin level, received IV ferahem 6/19    Hyperkalemia -resolved after  giving Kayexalate   COPD:  - Advanced. On 4-7L [place] at baseline. Followed by Dr. Lamonte Sakai of pulmonlogy. Treated w/ solumedrol and nebulizers upon arrival due to concerns of COPD exacerbation.  - continue home O2 of 4-7LNC - No further steroids - continue Dulera, Spiriva  CKD:  - baseline Cf 1.15. Currently 1.3.    Abd pain:  - likely from GI bleed. Doubt infectious process but cannot entirely exclude. WBC 17.2. AFVSS.  - KUB - Treatment as above.   Chest pain:  -Troponin negative on admission, currently denies any chest pain  HTN: - continue metoprolol, lisinopril  DM: - Poorly controlled,  will increase Lantus from 24-28 units twice a day, will add 5 units NovoLog before meals, continue with insulin sliding scale . - Continue to hold metformin   CHF: -Discontinue torsemide scanning elevation of creatinine  Depression: - continue xanax, Celexa  HLD: - continue lipitor  Peripheral neuropathy - continue neurontin  Insomnia: - continue trazodone, Ambien  Chronic pain: - continue tramadol    Code Status : Full  Family Communication  : None at bedside  Disposition Plan  : home when stable  Consults  :  GI  Procedures  : None  DVT Prophylaxis  :  SCDs   Lab Results  Component Value Date   PLT 175 04/05/2017    Antibiotics  :   Anti-infectives    None        Objective:   Vitals:   04/05/17 0211 04/05/17 0608 04/05/17 0645 04/05/17 0836  BP: 106/61 (!) 113/53    Pulse: 83 (!) 40 82   Resp: 16 18    Temp: 97.6 F (36.4 C) 97.7 F (36.5 C)      TempSrc: Oral     SpO2: 99% 93%  94%  Weight:      Height:        Wt Readings from Last 3 Encounters:  04/03/17 118 kg (260 lb 3.2 oz)  03/30/17 116.8 kg (257 lb 8 oz)  02/22/17 116.1 kg (256 lb)     Intake/Output Summary (Last 24 hours) at 04/05/17 1218 Last data filed at 04/05/17 5366  Gross per 24 hour  Intake              580 ml  Output             2126 ml  Net            -1546 ml     Physical Exam  Awake Alert, Oriented X 3,  Symmetrical Chest wall movement,Good air entry bilaterally, no wheezing, no use of accessory muscles  RRR,No Gallops,Rubs or new Murmurs, No Parasternal Heave Abdomen soft, nontender, nondistended, bowel sounds present  No Cyanosis, Clubbing or edema, No new Rash or bruise     Data Review:    CBC  Recent Labs Lab 03/30/17 1430 03/31/17 0820 04/03/17 1201 04/03/17 2221 04/04/17 1926 04/05/17 0537  WBC 9.7 7.0 17.2*  --  16.8* 10.7*  HGB 6.2* 8.0* 8.0* 9.3* 8.2* 8.9*  HCT 21.2* 26.3* 27.7* 31.0* 27.4* 29.0*  PLT 296 224 295  --  223 175  MCV 83.5 84.6 87.9  --  86.2 87.9  MCH 24.4* 25.7* 25.4*  --  25.8* 27.0  MCHC 29.2* 30.4 28.9*  --  29.9* 30.7  RDW 17.3* 16.4* 17.4*  --  19.6* 19.3*  LYMPHSABS 1.2  --  2.4  --   --   --   MONOABS 0.7  --  1.2*  --   --   --   EOSABS 0.2  --  0.2  --   --   --   BASOSABS 0.0  --  0.0  --   --   --     Chemistries   Recent Labs Lab 03/30/17 1430 03/30/17 2028 03/31/17 0820 04/03/17 1201 04/03/17 2221 04/04/17 0736 04/05/17 0537  NA 138 137 134* 137  --  132* 134*  K 5.3* 4.9 4.6 4.9  --  5.4* 3.9  CL 100* 100* 97* 97*  --  98* 97*  CO2 30 30 28 31   --  24 26  GLUCOSE 196* 191* 227* 261* 517* 315* 241*  BUN 17 16 17  45*  --  49* 47*  CREATININE 1.14 1.15 1.22 1.33*  --  1.34* 1.44*  CALCIUM 8.5* 8.6* 8.3* 8.8*  --  8.7* 8.2*  AST 14*  --  16 13*  --   --   --   ALT 11*  --  11* 10*  --   --   --   ALKPHOS 130*  --  129* 116  --   --   --   BILITOT 0.4  --  0.7 0.6  --   --    --    ------------------------------------------------------------------------------------------------------------------ No results for input(s): CHOL, HDL, LDLCALC, TRIG, CHOLHDL, LDLDIRECT in the last 72 hours.  Lab Results  Component Value Date   HGBA1C 7.8 (H) 02/23/2017   ------------------------------------------------------------------------------------------------------------------ No results for input(s): TSH, T4TOTAL, T3FREE, THYROIDAB in the last 72 hours.  Invalid input(s): FREET3 ------------------------------------------------------------------------------------------------------------------ No results for input(s): VITAMINB12, FOLATE, FERRITIN, TIBC, IRON, RETICCTPCT in the last 72 hours.  Coagulation profile  Recent Labs Lab 03/30/17 1552  INR 1.03    No results for input(s): DDIMER in the last 72 hours.  Cardiac Enzymes  Recent Labs Lab 04/03/17 1201  TROPONINI <0.03   ------------------------------------------------------------------------------------------------------------------    Component Value Date/Time   BNP 84.2 04/03/2017 1201    Inpatient Medications  Scheduled Meds: . atorvastatin  40 mg Oral QHS  . citalopram  40 mg Oral Daily  . feeding supplement (ENSURE ENLIVE)  237 mL Oral BID BM  . gabapentin  300 mg Oral TID  . insulin aspart  0-15 Units Subcutaneous TID WC  . insulin aspart  0-5 Units Subcutaneous QHS  . insulin aspart  5 Units Subcutaneous TID WC  . insulin glargine  28 Units Subcutaneous BID  . lisinopril  2.5 mg Oral Daily  . metoprolol tartrate  25 mg Oral BID  . mometasone-formoterol  2 puff Inhalation BID  . pantoprazole (PROTONIX) IV  40 mg Intravenous Q12H  . torsemide  40 mg Oral Daily   Continuous Infusions: . sodium chloride     PRN Meds:.acetaminophen **OR** acetaminophen, ALPRAZolam, hydrALAZINE, ipratropium-albuterol, ondansetron **OR** ondansetron (ZOFRAN) IV, tiotropium, traMADol, traZODone,  zolpidem  Micro Results No results found for this or any previous visit (from the past 240 hour(s)).  Radiology Reports Dg Chest 2 View  Result Date: 04/03/2017 CLINICAL DATA:  Shortness of breath. EXAM: CHEST  2 VIEW COMPARISON:  Radiographs of March 30, 2017. FINDINGS: The heart size and mediastinal contours are within normal limits. Both lungs are clear. Status post coronary artery bypass graft. No pneumothorax or pleural effusion is noted. Large hiatal hernia is noted. The visualized skeletal structures are unremarkable. IMPRESSION: No active cardiopulmonary disease.  Large hiatal hernia. Electronically Signed   By: Marijo Conception, M.D.   On: 04/03/2017 11:11  Dg Chest 2 View  Result Date: 03/30/2017 CLINICAL DATA:  Shortness of breath EXAM: CHEST  2 VIEW COMPARISON:  02/22/2017 FINDINGS: Previous median sternotomy and CABG procedure. Normal heart size. No pleural effusion or edema identified. No airspace consolidation identified. IMPRESSION: 1. No acute cardiopulmonary abnormalities. 2. Hiatal hernia. Electronically Signed   By: Kerby Moors M.D.   On: 03/30/2017 13:58   Dg Abd 1 View  Result Date: 04/03/2017 CLINICAL DATA:  Intermittent left lower quadrant abdominal pain. EXAM: ABDOMEN - 1 VIEW COMPARISON:  CT abdomen pelvis 04/22/2015. CT abdomen and pelvis 12/29/2014, 05/19/2014 and earlier. FINDINGS: Bowel gas pattern unremarkable without evidence of obstruction or significant ileus. Moderate stool burden in the colon. No visible opaque urinary tract calculi. Prior aorto iliac stent grafting and coil embolization of the right internal iliac artery. Surgical suture material in the pelvis. IMPRESSION: No acute abdominal abnormality. Electronically Signed   By: Evangeline Dakin M.D.   On: 04/03/2017 18:13     Dimitrius Steedman M.D on 04/05/2017 at 12:18 PM  Between 7am to 7pm - Pager - [phone]  After 7pm go to [email or web address] - password [facility]  [facility] -  Office   [phone]

## 2017-04-05 NOTE — Progress Notes (Signed)
Daily Rounding Note  04/05/2017, 12:33 PM  LOS: 0 days   SUBJECTIVE:   Chief complaint: melenic stools last night. Overall breathing is better. Hgb has not increased despite PRBCs x 3  OBJECTIVE:         Vital signs in last 24 hours:    Temp:  [97.5 F (36.4 C)-98 F (36.7 C)] 97.7 F (36.5 C) (06/20 0300) Pulse Rate:  [40-83] 82 (06/20 0645) Resp:  [15-18] 18 (06/20 0608) BP: (105-115)/(44-63) 113/53 (06/20 0608) SpO2:  [93 %-100 %] 94 % (06/20 0836) Last BM Date: 04/04/17 Filed Weights   04/03/17 2031  Weight: 118 kg (260 lb 3.2 oz)   General: Still looks chronically ill but overall improved breathing.   Heart: Rate in the eighties, subtle irregularity. Chest: No labored breathing. Diminished breath sounds. No cough Abdomen: Obese. Nontender. Active bowel sounds.  Extremities: No CCE. Neuro/Psych:  Alert. Oriented times 3. Moves all 4 limbs.  Intake/Output from previous day: 06/19 0701 - 06/20 0700 In: 580 [P.O.:240; Blood:340] Out: 1876 [Urine:1876]  Intake/Output this shift: Total I/O In: -  Out: 250 [Urine:250]  Lab Results:  Recent Labs  04/03/17 1201 04/03/17 2221 04/04/17 1926 04/05/17 0537  WBC 17.2*  --  16.8* 10.7*  HGB 8.0* 9.3* 8.2* 8.9*  HCT 27.7* 31.0* 27.4* 29.0*  PLT 295  --  223 175   BMET  Recent Labs  04/03/17 1201 04/03/17 2221 04/04/17 0736 04/05/17 0537  NA 137  --  132* 134*  K 4.9  --  5.4* 3.9  CL 97*  --  98* 97*  CO2 31  --  24 26  GLUCOSE 261* 517* 315* 241*  BUN 45*  --  49* 47*  CREATININE 1.33*  --  1.34* 1.44*  CALCIUM 8.8*  --  8.7* 8.2*   LFT  Recent Labs  04/03/17 1201  PROT 6.4*  ALBUMIN 3.2*  AST 13*  ALT 10*  ALKPHOS 116  BILITOT 0.6     Studies/Results: Dg Abd 1 View  Result Date: 04/03/2017 CLINICAL DATA:  Intermittent left lower quadrant abdominal pain. EXAM: ABDOMEN - 1 VIEW COMPARISON:  CT abdomen pelvis 04/22/2015. CT  abdomen and pelvis 12/29/2014, 05/19/2014 and earlier. FINDINGS: Bowel gas pattern unremarkable without evidence of obstruction or significant ileus. Moderate stool burden in the colon. No visible opaque urinary tract calculi. Prior aorto iliac stent grafting and coil embolization of the right internal iliac artery. Surgical suture material in the pelvis. IMPRESSION: No acute abdominal abnormality. Electronically Signed   By: Evangeline Dakin M.D.   On: 04/03/2017 18:13   Scheduled Meds: . atorvastatin  40 mg Oral QHS  . citalopram  40 mg Oral Daily  . feeding supplement (ENSURE ENLIVE)  237 mL Oral BID BM  . gabapentin  300 mg Oral TID  . insulin aspart  0-15 Units Subcutaneous TID WC  . insulin aspart  0-5 Units Subcutaneous QHS  . insulin aspart  5 Units Subcutaneous TID WC  . insulin glargine  28 Units Subcutaneous BID  . lisinopril  2.5 mg Oral Daily  . metoprolol tartrate  25 mg Oral BID  . mometasone-formoterol  2 puff Inhalation BID  . pantoprazole (PROTONIX) IV  40 mg Intravenous Q12H   Continuous Infusions: . sodium chloride     PRN Meds:.acetaminophen **OR** acetaminophen, ALPRAZolam, hydrALAZINE, ipratropium-albuterol, ondansetron **OR** ondansetron (ZOFRAN) IV, tiotropium, traMADol, traZODone, zolpidem   ASSESMENT:   *  Acute on chronic  anemia in pt s/p inummerable endoscopic evals.  Findings of recurrent gastric and duodenal polyps and AVMs.  Also hx of colonic AVMs.   Now with melenic stools.  On BID, IV Protonix.    *  COPD on chronic 7 liters of [place] oxygen.  Baseline status is poor.    *  Poorly controlled IDDM.    *  anxiety    PLAN   *  Set up EGD tomorrow At 9 AM with moderate sedation.    *  Given that he is likely bleeding from AVMs and not from ulcer disease, and going to switch over to oral Protonix   [name]  04/05/2017, 12:33 PM Pager: [phone]     Attending physician's note   I have taken an interval history, reviewed the chart and  examined the patient. I agree with the Advanced Practitioner's note, impression and recommendations.   Melena and no increase in Hb despite 3 U PRBCs suggesting on ongoing, slow UGI bleed.   ABL and iron deficiency anemia.   Proceed with IV Fe infusion today. Trend CBC.   Transfuse to keep Hb > 8.  EGD tomorrow. He is at a higher risk of procedure and sedation related complications due to his severe COPD. The risks (including bleeding, perforation, infection, missed lesions, medication reactions and possible hospitalization or surgery if complications occur), benefits, and alternatives to endoscopy with possible biopsy and possible dilation were discussed with the patient and they consent to proceed. He understands he is at an increased risk and consents to proceed.    Howard Edward, MD [facility] [phone] Mon-Fri 8a-5p [phone] after 5p, weekends, holidays

## 2017-04-05 NOTE — Progress Notes (Addendum)
Inpatient Diabetes Program Recommendations  AACE/ADA: New Consensus Statement on Inpatient Glycemic Control (2015)  Target Ranges:  Prepandial:   less than 140 mg/dL      Peak postprandial:   less than 180 mg/dL (1-2 hours)      Critically ill patients:  140 - 180 mg/dL   Results for [name], [name] (MRN [id number]) as of 04/05/2017 07:37  Ref. Range 04/04/2017 00:38 04/04/2017 08:08 04/04/2017 12:26 04/04/2017 17:18 04/04/2017 20:44  Glucose-Capillary Latest Ref Range: 65 - 99 mg/dL 449 (H) 322 (H) 373 (H) 286 (H) 304 (H)    Home DM Meds: Lantus 17 units BID       Novolog 10 units TID       Metformin 500 mg BID  Current Insulin Orders: Lantus 28 units BID      Novolog Moderate Correction Scale/ SSI (0-15 units) TID AC + HS       MD- Note Lantus increased to 28 units BID today.  Please also consider starting Novolog Meal Coverage for this patient:  Novolog 5 units TID with meals (hold if pt eats <50% of meal) (50% home dose)  Patient takes Novolog 10 units TID with meals at home      --Will follow patient during hospitalization--  Wyn Quaker RN, MSN, CDE Diabetes Coordinator Inpatient Glycemic Control Team Team Pager: [phone] (8a-5p)

## 2017-04-05 NOTE — Progress Notes (Signed)
Pt's blood completed. Pt is already due for a CBC in the morning- will not add another CBC for completion of blood.

## 2017-04-06 ENCOUNTER — Encounter (HOSPITAL_COMMUNITY)
Admission: EM | Disposition: A | Discharge status: Home Health | Payer: Self-pay | Source: Home / Self Care | Attending: Internal Medicine

## 2017-04-06 ENCOUNTER — Encounter (HOSPITAL_COMMUNITY): Payer: Self-pay | Admitting: *Deleted

## 2017-04-06 DIAGNOSIS — K3189 Other diseases of stomach and duodenum: Secondary | ICD-10-CM

## 2017-04-06 HISTORY — PX: ESOPHAGOGASTRODUODENOSCOPY: SHX5428

## 2017-04-06 LAB — CBC
HEMATOCRIT: 30.4 % — AB (ref 39.0–52.0)
HEMOGLOBIN: 9.2 g/dL — AB (ref 13.0–17.0)
MCH: 26.8 pg (ref 26.0–34.0)
MCHC: 30.3 g/dL (ref 30.0–36.0)
MCV: 88.6 fL (ref 78.0–100.0)
Platelets: 157 10*3/uL (ref 150–400)
RBC: 3.43 MIL/uL — ABNORMAL LOW (ref 4.22–5.81)
RDW: 19.7 % — ABNORMAL HIGH (ref 11.5–15.5)
WBC: 6.9 10*3/uL (ref 4.0–10.5)

## 2017-04-06 LAB — GLUCOSE, CAPILLARY
GLUCOSE-CAPILLARY: 164 mg/dL — AB (ref 65–99)
GLUCOSE-CAPILLARY: 258 mg/dL — AB (ref 65–99)
GLUCOSE-CAPILLARY: 185 mg/dL — AB (ref 65–99)
GLUCOSE-CAPILLARY: 261 mg/dL — AB (ref 65–99)
Glucose-Capillary: 177 mg/dL — ABNORMAL HIGH (ref 65–99)

## 2017-04-06 LAB — BASIC METABOLIC PANEL
ANION GAP: 10 (ref 5–15)
BUN: 38 mg/dL — ABNORMAL HIGH (ref 6–20)
CHLORIDE: 98 mmol/L — AB (ref 101–111)
CO2: 30 mmol/L (ref 22–32)
Calcium: 8.3 mg/dL — ABNORMAL LOW (ref 8.9–10.3)
Creatinine, Ser: 1.53 mg/dL — ABNORMAL HIGH (ref 0.61–1.24)
GFR calc Af Amer: 50 mL/min — ABNORMAL LOW (ref >60)
GFR calc non Af Amer: 43 mL/min — ABNORMAL LOW (ref >60)
Glucose, Bld: 172 mg/dL — ABNORMAL HIGH (ref 65–99)
Potassium: 3.7 mmol/L (ref 3.5–5.1)
Sodium: 138 mmol/L (ref 135–145)

## 2017-04-06 SURGERY — EGD (ESOPHAGOGASTRODUODENOSCOPY)
Anesthesia: Moderate Sedation

## 2017-04-06 MED ORDER — SODIUM CHLORIDE 0.9 % IV SOLN
INTRAVENOUS | Status: DC
  Administered 2017-04-06 – 2017-04-09 (×4): via INTRAVENOUS

## 2017-04-06 MED ORDER — GLUCERNA SHAKE PO LIQD
237.0000 mL | Freq: Three times a day (TID) | ORAL | Status: DC
  Administered 2017-04-07 – 2017-04-10 (×6): 237 mL via ORAL

## 2017-04-06 MED ORDER — MIDAZOLAM HCL 10 MG/2ML IJ SOLN
INTRAMUSCULAR | Status: DC | PRN
Start: 2017-04-06 — End: 2017-04-06
  Administered 2017-04-06: 1 mg via INTRAVENOUS
  Administered 2017-04-06: 2 mg via INTRAVENOUS
  Administered 2017-04-06: 1 mg via INTRAVENOUS

## 2017-04-06 MED ORDER — FENTANYL CITRATE (PF) 100 MCG/2ML IJ SOLN
INTRAMUSCULAR | Status: AC
  Filled 2017-04-06: qty 2

## 2017-04-06 MED ORDER — DIPHENHYDRAMINE HCL 50 MG/ML IJ SOLN
INTRAMUSCULAR | Status: AC
  Filled 2017-04-06: qty 1

## 2017-04-06 MED ORDER — FENTANYL CITRATE (PF) 100 MCG/2ML IJ SOLN
INTRAMUSCULAR | Status: DC | PRN
Start: 2017-04-06 — End: 2017-04-06
  Administered 2017-04-06 (×2): 25 ug via INTRAVENOUS

## 2017-04-06 MED ORDER — SODIUM CHLORIDE 0.9 % IV SOLN
INTRAVENOUS | Status: DC
  Administered 2017-04-06: 500 mL via INTRAVENOUS

## 2017-04-06 MED ORDER — MIDAZOLAM HCL 5 MG/ML IJ SOLN
INTRAMUSCULAR | Status: AC
  Filled 2017-04-06: qty 2

## 2017-04-06 MED ORDER — METOCLOPRAMIDE HCL 5 MG/ML IJ SOLN
20.0000 mg | Freq: Once | INTRAVENOUS | Status: AC
  Administered 2017-04-06: 20 mg via INTRAVENOUS
  Filled 2017-04-06 (×2): qty 4

## 2017-04-06 MED ORDER — POLYETHYLENE GLYCOL 3350 17 GM/SCOOP PO POWD
1.0000 | Freq: Once | ORAL | Status: AC
  Administered 2017-04-06: 255 g via ORAL
  Filled 2017-04-06 (×2): qty 255

## 2017-04-06 MED ORDER — BUTAMBEN-TETRACAINE-BENZOCAINE 2-2-14 % EX AERO
INHALATION_SPRAY | CUTANEOUS | Status: DC | PRN
  Administered 2017-04-06: 1 via TOPICAL

## 2017-04-06 NOTE — Op Note (Signed)
[facility] Patient Name: [name] Procedure Date : 04/06/2017 MRN: [id number] Attending MD: Ladene Artist , MD Date of Birth: September 05, 1941 CSN: [id number] Age: 76 Admit Type: Inpatient Procedure:                Upper GI endoscopy Indications:              Iron deficiency anemia secondary to chronic blood                            loss, Melena Providers:                Pricilla Riffle. Fuller Plan, MD, Cleda Daub, RN, Alan Mulder, Technician Referring MD:              Medicines:                Fentanyl 50 micrograms IV, Midazolam 4 mg IV Complications:            No immediate complications. Estimated Blood Loss:     Estimated blood loss was minimal. Procedure:                Pre-Anesthesia Assessment:                           - Prior to the procedure, a History and Physical                            was performed, and patient medications and                            allergies were reviewed. The patient's tolerance of                            previous anesthesia was also reviewed. The risks                            and benefits of the procedure and the sedation                            options and risks were discussed with the patient.                            All questions were answered, and informed consent                            was obtained. Prior Anticoagulants: The patient has                            taken no previous anticoagulant or antiplatelet                            agents. ASA Grade Assessment: III - A patient with  severe systemic disease. After reviewing the risks                            and benefits, the patient was deemed in                            satisfactory condition to undergo the procedure.                           After obtaining informed consent, the endoscope was                            passed under direct vision. Throughout the   procedure, the patient's blood pressure, pulse, and                            oxygen saturations were monitored continuously. The                            Endosonoscope was introduced through the mouth, and                            advanced to the fourth part of duodenum. The upper                            GI endoscopy was accomplished without difficulty.                            The patient tolerated the procedure well. Scope In: Scope Out: Findings:      The Z-line was variable and was found 40 cm from the incisors.      The exam of the esophagus was otherwise normal.      A large hiatal hernia was found.      A few localized, small non-bleeding linear erosions and patchy erythema       were found in the stomach associated with the hiatal hernia. There were       no stigmata of recent bleeding.      The exam of the stomach was otherwise normal.      A single 2 mm angiodysplastic lesion without bleeding was found in the       second portion of the duodenum. Fulguration to ablate the lesion by       argon plasma was successful. Estimated blood loss was minimal.      A small non-bleeding diverticulum was found in the third portion of the       duodenum.      A single 7 mm submucosal nodule with a localized distribution was found       in the third portion of the duodenum.      The exam of the duodenum was otherwise normal. Impression:               - Z-line variable, 40 cm from the incisors.                           - Large hiatal hernia.                           -  Mild non-bleeding erosive gastropathy associated                            with the hiatal hernia.                           - A single small non-bleeding angiodysplastic                            lesion in the duodenum. Treated with argon plasma                            coagulation (APC).                           - Non-bleeding duodenal diverticulum.                           - Submucosal nodule found in the  duodenum.                           - No specimens collected. Moderate Sedation:      Moderate (conscious) sedation was administered by the endoscopy nurse       and supervised by the endoscopist. The following parameters were       monitored: oxygen saturation, heart rate, blood pressure, respiratory       rate, EKG, adequacy of pulmonary ventilation, and response to care.       Total physician intraservice time was 20 minutes. Recommendation:           - Return patient to hospital ward for ongoing care.                           - To visualize the small bowel, perform video                            capsule endoscopy tomorrow.                           - Clear liquid diet today.                           - No aspirin, ibuprofen, naproxen, or other                            non-steroidal anti-inflammatory drugs for 2 weeks. Procedure Code(s):        --- Professional ---                           [phone], Esophagogastroduodenoscopy, flexible,                            transoral; with ablation of tumor(s), polyp(s), or                            other lesion(s) (includes pre- and post-dilation  and guide wire passage, when performed)                           99152, Moderate sedation services provided by the                            same physician or other qualified health care                            professional performing the diagnostic or                            therapeutic service that the sedation supports,                            requiring the presence of an independent trained                            observer to assist in the monitoring of the                            patient's level of consciousness and physiological                            status; initial 15 minutes of intraservice time,                            patient age 38 years or older Diagnosis Code(s):        --- Professional ---                           K22.8, Other  specified diseases of esophagus                           K44.9, Diaphragmatic hernia without obstruction or                            gangrene                           K31.89, Other diseases of stomach and duodenum                           K31.819, Angiodysplasia of stomach and duodenum                            without bleeding                           D50.0, Iron deficiency anemia secondary to blood                            loss (chronic)                           K92.1, Melena (includes Hematochezia)  K57.10, Diverticulosis of small intestine without                            perforation or abscess without bleeding CPT copyright 2016 [employer]. All rights reserved. The codes documented in this report are preliminary and upon coder review may  be revised to meet current compliance requirements. Ladene Artist, MD 04/06/2017 9:51:23 AM This report has been signed electronically. Number of Addenda: 0

## 2017-04-06 NOTE — Progress Notes (Signed)
PROGRESS NOTE                                                                                                                                                                                                             Patient Demographics:    [name], is a 76 y.o. male, DOB - Feb 26, 1941, GEZ:[id number]  Admit date - 04/03/2017   Admitting Physician Waldemar Dickens, MD  Outpatient Primary MD for the patient is [name], MD  LOS - 1   Chief Complaint  Patient presents with  . Shortness of Breath       Brief Narrative   76 y.o. male with medical history significant of diabetes, insomnia, hyperlipidemia, AAA, recurrent GI bleeds with history of AVM and duodenal ulceration, bipolar, CAD/MI/CABG, COPD, depression, hypertension, GERD. Patient presenting with recurrent GI bleed, Went for endoscopy 6/21, significant for nonbleeding erosive gastropathy, and single small nonbleeding angiodysplastic lesion.  Z-line variable, 40 cm from the incisors. - Large hiatal hernia. - Mild non-bleeding erosive gastropathy associated with the hiatal hernia. 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 Treated with argon plasma coagulation (APC). - Non-bleeding duodenal diverticulum. - Submucosal nodule found in the duodenum. - No specimens collected.  Subjective:    [name] had 4 similar episodes of melena yesterday, report generalized weakness, otherwise no nausea, no vomiting or abdominal pain.   Assessment  & Plan :    Active Problems:   Hyperlipidemia   Essential hypertension   COPD (chronic obstructive pulmonary disease) (HCC)   Chronic GI bleeding   Chronic hypoxemic respiratory failure (HCC)   Insomnia   Gastric AVM   AVM (arteriovenous malformation) of duodenum, acquired with hemorrhage   Symptomatic anemia   GIB (gastrointestinal bleeding)   GI bleed     GI bleed - Patient with known history of  recurrent GI bleeds in the past secondary to known GI tract AVM mainly gastric and duodenal. - Presents with hemoglobin of 8, So far required total of 3 units PRBC transfusion, without significant increase of his hemoglobin, appears to be intermittent GI bleed, as initially with normal color stool, but with melena for last 2 days . 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 - Plan for V/Q capsule endoscopy tomorrow.  Symptomatic acute blood loss/iron deficiency anemia:  - Iron deficiency anemia secondary to acute on chronic blood loss . Transfuse total of 3 units PRBC during hospital stay - Workup significant for iron deficiency anemia withlow iron and ferritin level, received IV ferahem 6/19    Hyperkalemia -resolved after giving Kayexalate   COPD:  - Advanced. On 4-7L [place] at baseline. Followed by Dr. Lamonte Sakai of pulmonlogy. Treated w/ solumedrol and nebulizers upon arrival due to concerns of COPD exacerbation.  - continue home O2 of 4-7LNC - No further steroids - continue Dulera, Spiriva  CKD:  - baseline Cf 1.15. Currently 1.3.    Abd pain:  - likely from GI bleed. Doubt infectious process but cannot entirely exclude. WBC 17.2. AFVSS.  - KUB with no acute abnormalities  Chest pain:  -Troponin negative on admission, currently denies any chest pain  HTN: - continue metoprolol, lisinopril  DM: - Controlled after increasing Lantus to 28 units twice a day, and starting 5 units NovoLog before meals, continue with insulin sliding scale . - Continue to hold metformin   CHF: - Holding torsemide given elevation of creatinine . - Patient with slight bump in creatinine today at 1.55, so will stop lisinopril as well, his blood pressure overall is soft, so will give gentle hydration normal saline 50 mL per hour total of 5  hours.  Depression: - continue xanax, Celexa  HLD: - continue lipitor  Peripheral neuropathy - continue neurontin  Insomnia: - continue trazodone, Ambien  Chronic pain: - continue tramadol    Code Status : Full  Family Communication  : None at bedside  Disposition Plan  : home when stable  Consults  :  GI  Procedures  : None  DVT Prophylaxis  :  SCDs   Lab Results  Component Value Date   PLT 157 04/06/2017    Antibiotics  :   Anti-infectives    None        Objective:   Vitals:   04/06/17 1005 04/06/17 1026 04/06/17 1059 04/06/17 1119  BP: (!) 104/55 (!) 104/55  115/63  Pulse: 81 75  82  Resp: 13  16   Temp:  97.6 F (36.4 C)    TempSrc:      SpO2: 96% 100%  96%  Weight:      Height:        Wt Readings from Last 3 Encounters:  04/03/17 118 kg (260 lb 3.2 oz)  03/30/17 116.8 kg (257 lb 8 oz)  02/22/17 116.1 kg (256 lb)     Intake/Output Summary (Last 24 hours) at 04/06/17 1257 Last data filed at 04/06/17 1250  Gross per 24 hour  Intake              820 ml  Output             3425 ml  Net            -2605 ml     Physical Exam  Awake Alert, Oriented X 3,  Good air entry bilaterally, clear to auscultation, no wheezing, no use of accessory muscle Regular rate and rhythm, no rubs murmurs gallops Abdomen soft, nontender, nondistended, bowel sounds present No Cyanosis, Clubbing or edema, No new Rash or bruise     Data Review:    CBC  Recent Labs Lab 03/30/17 1430 03/31/17 0820 04/03/17 1201 04/03/17 2221 04/04/17 1926 04/05/17 0537 04/05/17 1442 04/06/17 0639  WBC 9.7 7.0  17.2*  --  16.8* 10.7*  --  6.9  HGB 6.2* 8.0* 8.0* 9.3* 8.2* 8.9* 9.8* 9.2*  HCT 21.2* 26.3* 27.7* 31.0* 27.4* 29.0* 32.1* 30.4*  PLT 296 224 295  --  223 175  --  157  MCV 83.5 84.6 87.9  --  86.2 87.9  --  88.6  MCH 24.4* 25.7* 25.4*  --  25.8* 27.0  --  26.8  MCHC 29.2* 30.4 28.9*  --  29.9* 30.7  --  30.3  RDW 17.3* 16.4* 17.4*  --  19.6* 19.3*   --  19.7*  LYMPHSABS 1.2  --  2.4  --   --   --   --   --   MONOABS 0.7  --  1.2*  --   --   --   --   --   EOSABS 0.2  --  0.2  --   --   --   --   --   BASOSABS 0.0  --  0.0  --   --   --   --   --     Chemistries   Recent Labs Lab 03/30/17 1430  03/31/17 0820 04/03/17 1201 04/03/17 2221 04/04/17 0736 04/05/17 0537 04/06/17 0639  NA 138  < > 134* 137  --  132* 134* 138  K 5.3*  < > 4.6 4.9  --  5.4* 3.9 3.7  CL 100*  < > 97* 97*  --  98* 97* 98*  CO2 30  < > 28 31  --  24 26 30   GLUCOSE 196*  < > 227* 261* 517* 315* 241* 172*  BUN 17  < > 17 45*  --  49* 47* 38*  CREATININE 1.14  < > 1.22 1.33*  --  1.34* 1.44* 1.53*  CALCIUM 8.5*  < > 8.3* 8.8*  --  8.7* 8.2* 8.3*  AST 14*  --  16 13*  --   --   --   --   ALT 11*  --  11* 10*  --   --   --   --   ALKPHOS 130*  --  129* 116  --   --   --   --   BILITOT 0.4  --  0.7 0.6  --   --   --   --   < > = values in this interval not displayed. ------------------------------------------------------------------------------------------------------------------ No results for input(s): CHOL, HDL, LDLCALC, TRIG, CHOLHDL, LDLDIRECT in the last 72 hours.  Lab Results  Component Value Date   HGBA1C 7.8 (H) 02/23/2017   ------------------------------------------------------------------------------------------------------------------ No results for input(s): TSH, T4TOTAL, T3FREE, THYROIDAB in the last 72 hours.  Invalid input(s): FREET3 ------------------------------------------------------------------------------------------------------------------ No results for input(s): VITAMINB12, FOLATE, FERRITIN, TIBC, IRON, RETICCTPCT in the last 72 hours.  Coagulation profile  Recent Labs Lab 03/30/17 1552  INR 1.03    No results for input(s): DDIMER in the last 72 hours.  Cardiac Enzymes  Recent Labs Lab 04/03/17 1201  TROPONINI <0.03    ------------------------------------------------------------------------------------------------------------------    Component Value Date/Time   BNP 84.2 04/03/2017 1201    Inpatient Medications  Scheduled Meds: . atorvastatin  40 mg Oral QHS  . citalopram  40 mg Oral Daily  . [START ON 04/07/2017] feeding supplement (GLUCERNA SHAKE)  237 mL Oral TID BM  . gabapentin  300 mg Oral TID  . insulin aspart  0-15 Units Subcutaneous TID WC  . insulin aspart  0-5 Units Subcutaneous QHS  . insulin aspart  5 Units Subcutaneous TID WC  . insulin glargine  28 Units Subcutaneous BID  . mometasone-formoterol  2 puff Inhalation BID  . pantoprazole  40 mg Oral BID AC  . polyethylene glycol powder  1 Container Oral Once   Continuous Infusions: . sodium chloride    . sodium chloride    . metoCLOPramide (REGLAN) injection     PRN Meds:.acetaminophen **OR** acetaminophen, ALPRAZolam, hydrALAZINE, ipratropium-albuterol, ondansetron **OR** ondansetron (ZOFRAN) IV, tiotropium, traMADol, traZODone, zolpidem  Micro Results No results found for this or any previous visit (from the past 240 hour(s)).  Radiology Reports Dg Chest 2 View  Result Date: 04/03/2017 CLINICAL DATA:  Shortness of breath. EXAM: CHEST  2 VIEW COMPARISON:  Radiographs of March 30, 2017. FINDINGS: The heart size and mediastinal contours are within normal limits. Both lungs are clear. Status post coronary artery bypass graft. No pneumothorax or pleural effusion is noted. Large hiatal hernia is noted. The visualized skeletal structures are unremarkable. IMPRESSION: No active cardiopulmonary disease.  Large hiatal hernia. Electronically Signed   By: Marijo Conception, M.D.   On: 04/03/2017 11:11   Dg Chest 2 View  Result Date: 03/30/2017 CLINICAL DATA:  Shortness of breath EXAM: CHEST  2 VIEW COMPARISON:  02/22/2017 FINDINGS: Previous median sternotomy and CABG procedure. Normal heart size. No pleural effusion or edema identified. No  airspace consolidation identified. IMPRESSION: 1. No acute cardiopulmonary abnormalities. 2. Hiatal hernia. Electronically Signed   By: Kerby Moors M.D.   On: 03/30/2017 13:58   Dg Abd 1 View  Result Date: 04/03/2017 CLINICAL DATA:  Intermittent left lower quadrant abdominal pain. EXAM: ABDOMEN - 1 VIEW COMPARISON:  CT abdomen pelvis 04/22/2015. CT abdomen and pelvis 12/29/2014, 05/19/2014 and earlier. FINDINGS: Bowel gas pattern unremarkable without evidence of obstruction or significant ileus. Moderate stool burden in the colon. No visible opaque urinary tract calculi. Prior aorto iliac stent grafting and coil embolization of the right internal iliac artery. Surgical suture material in the pelvis. IMPRESSION: No acute abdominal abnormality. Electronically Signed   By: Evangeline Dakin M.D.   On: 04/03/2017 18:13     Khyli Swaim M.D on 04/06/2017 at 12:57 PM  Between 7am to 7pm - Pager - [phone]  After 7pm go to [email or web address] - password [facility]  [facility] -  Office  [phone]

## 2017-04-06 NOTE — H&P (View-Only) (Signed)
Daily Rounding Note  04/05/2017, 12:33 PM  LOS: 0 days   SUBJECTIVE:   Chief complaint: melenic stools last night. Overall breathing is better. Hgb has not increased despite PRBCs x 3  OBJECTIVE:         Vital signs in last 24 hours:    Temp:  [97.5 F (36.4 C)-98 F (36.7 C)] 97.7 F (36.5 C) (06/20 2778) Pulse Rate:  [40-83] 82 (06/20 0645) Resp:  [15-18] 18 (06/20 0608) BP: (105-115)/(44-63) 113/53 (06/20 0608) SpO2:  [93 %-100 %] 94 % (06/20 0836) Last BM Date: 04/04/17 Filed Weights   04/03/17 2031  Weight: 118 kg (260 lb 3.2 oz)   General: Still looks chronically ill but overall improved breathing.   Heart: Rate in the eighties, subtle irregularity. Chest: No labored breathing. Diminished breath sounds. No cough Abdomen: Obese. Nontender. Active bowel sounds.  Extremities: No CCE. Neuro/Psych:  Alert. Oriented times 3. Moves all 4 limbs.  Intake/Output from previous day: 06/19 0701 - 06/20 0700 In: 580 [P.O.:240; Blood:340] Out: 1876 [Urine:1876]  Intake/Output this shift: Total I/O In: -  Out: 250 [Urine:250]  Lab Results:  Recent Labs  04/03/17 1201 04/03/17 2221 04/04/17 1926 04/05/17 0537  WBC 17.2*  --  16.8* 10.7*  HGB 8.0* 9.3* 8.2* 8.9*  HCT 27.7* 31.0* 27.4* 29.0*  PLT 295  --  223 175   BMET  Recent Labs  04/03/17 1201 04/03/17 2221 04/04/17 0736 04/05/17 0537  NA 137  --  132* 134*  K 4.9  --  5.4* 3.9  CL 97*  --  98* 97*  CO2 31  --  24 26  GLUCOSE 261* 517* 315* 241*  BUN 45*  --  49* 47*  CREATININE 1.33*  --  1.34* 1.44*  CALCIUM 8.8*  --  8.7* 8.2*   LFT  Recent Labs  04/03/17 1201  PROT 6.4*  ALBUMIN 3.2*  AST 13*  ALT 10*  ALKPHOS 116  BILITOT 0.6     Studies/Results: Dg Abd 1 View  Result Date: 04/03/2017 CLINICAL DATA:  Intermittent left lower quadrant abdominal pain. EXAM: ABDOMEN - 1 VIEW COMPARISON:  CT abdomen pelvis 04/22/2015. CT  abdomen and pelvis 12/29/2014, 05/19/2014 and earlier. FINDINGS: Bowel gas pattern unremarkable without evidence of obstruction or significant ileus. Moderate stool burden in the colon. No visible opaque urinary tract calculi. Prior aorto iliac stent grafting and coil embolization of the right internal iliac artery. Surgical suture material in the pelvis. IMPRESSION: No acute abdominal abnormality. Electronically Signed   By: Evangeline Dakin M.D.   On: 04/03/2017 18:13   Scheduled Meds: . atorvastatin  40 mg Oral QHS  . citalopram  40 mg Oral Daily  . feeding supplement (ENSURE ENLIVE)  237 mL Oral BID BM  . gabapentin  300 mg Oral TID  . insulin aspart  0-15 Units Subcutaneous TID WC  . insulin aspart  0-5 Units Subcutaneous QHS  . insulin aspart  5 Units Subcutaneous TID WC  . insulin glargine  28 Units Subcutaneous BID  . lisinopril  2.5 mg Oral Daily  . metoprolol tartrate  25 mg Oral BID  . mometasone-formoterol  2 puff Inhalation BID  . pantoprazole (PROTONIX) IV  40 mg Intravenous Q12H   Continuous Infusions: . sodium chloride     PRN Meds:.acetaminophen **OR** acetaminophen, ALPRAZolam, hydrALAZINE, ipratropium-albuterol, ondansetron **OR** ondansetron (ZOFRAN) IV, tiotropium, traMADol, traZODone, zolpidem   ASSESMENT:   *  Acute on chronic  anemia in pt s/p inummerable endoscopic evals.  Findings of recurrent gastric and duodenal polyps and AVMs.  Also hx of colonic AVMs.   Now with melenic stools.  On BID, IV Protonix.    *  COPD on chronic 7 liters of  oxygen.  Baseline status is poor.    *  Poorly controlled IDDM.    *  anxiety    PLAN   *  Set up EGD tomorrow At 9 AM with moderate sedation.    *  Given that he is likely bleeding from AVMs and not from ulcer disease, and going to switch over to oral Protonix   Azucena Freed  04/05/2017, 12:33 PM Pager: [phone]     Attending physician's note   I have taken an interval history, reviewed the chart and  examined the patient. I agree with the Advanced Practitioner's note, impression and recommendations.   Melena and no increase in Hb despite 3 U PRBCs suggesting on ongoing, slow UGI bleed.   ABL and iron deficiency anemia.   Proceed with IV Fe infusion today. Trend CBC.   Transfuse to keep Hb > 8.  EGD tomorrow. He is at a higher risk of procedure and sedation related complications due to his severe COPD. The risks (including bleeding, perforation, infection, missed lesions, medication reactions and possible hospitalization or surgery if complications occur), benefits, and alternatives to endoscopy with possible biopsy and possible dilation were discussed with the patient and they consent to proceed. He understands he is at an increased risk and consents to proceed.    Lucio Edward, MD [facility] [phone] Mon-Fri 8a-5p [phone] after 5p, weekends, holidays

## 2017-04-06 NOTE — Care Management Note (Signed)
Case Management Note  Patient Details  Name: [name] MRN: [id number] Date of Birth: [birth year]  Subjective/Objective:     Admitted with symptomatic anemia/ GI bleed. From home with wife. DME:  home oxygen [facility]). PTA active with [facility] for home health services.          [address] (Spouse) [employer] [phone]            PCP: Devra Dopp  Action/Plan:  Plan is to d/c to home when medically stable. CM to f/u with d/c needs.  Expected Discharge Date:                  Expected Discharge Plan:     In-House Referral:     Discharge planning Services     Post Acute Care Choice:  Resumption of Svcs/PTA Provider, Durable Medical Equipment, Home Health Choice offered to:   patient  DME Arranged:    DME Agency:     HH Arranged:   RN,PT,NA [place]:   [facility], referral made to [facility] @ [phone] or [phone] for resumption of home health services @ d/c.  Status of Service:     If discussed at Long Length of Stay Meetings, dates discussed:    Additional Comments:  Sharin Mons, RN 04/06/2017, 2:04 PM

## 2017-04-06 NOTE — Interval H&P Note (Signed)
History and Physical Interval Note:  04/06/2017 9:10 AM  [name]  has presented today for surgery, with the diagnosis of melena, anemia.  The various methods of treatment have been discussed with the patient and family. After consideration of risks, benefits and other options for treatment, the patient has consented to  Procedure(s): ESOPHAGOGASTRODUODENOSCOPY (EGD) (N/A) as a surgical intervention .  The patient's history has been reviewed, patient examined, no change in status, stable for surgery.  I have reviewed the patient's chart and labs.  Questions were answered to the patient's satisfaction.     Pricilla Riffle. Fuller Plan

## 2017-04-06 NOTE — Progress Notes (Signed)
Physical Therapy Treatment Patient Details Name: [name] MRN: [id number] DOB: 09-13-1941 Today's Date: 04/06/2017    History of Present Illness Pt is a 76 y/o male admitted secondary to GI bleed. 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    PT Comments    Pt seen for mobility progression. Pt tolerated ambulating a much further distance this session with use of RW and min guard for safety. Pt ambulated on 4L of supplemental O2 with SPO2 maintaining >95%. Pt would continue to benefit from skilled physical therapy services at this time while admitted and after d/c to address the below listed limitations in order to improve overall safety and independence with functional mobility.   Follow Up Recommendations  Home health PT;Supervision for mobility/OOB     Equipment Recommendations  None recommended by PT    Recommendations for Other Services       Precautions / Restrictions Precautions Precautions: Fall Restrictions Weight Bearing Restrictions: No    Mobility  Bed Mobility Overal bed mobility: Modified Independent             General bed mobility comments: with use of bed rail  Transfers Overall transfer level: Needs assistance Equipment used: Rolling walker (2 wheeled) Transfers: Sit to/from Stand Sit to Stand: Min guard         General transfer comment: increased time, good technique, min guard for safety  Ambulation/Gait Ambulation/Gait assistance: Min guard Ambulation Distance (Feet): 400 Feet Assistive device: Rolling walker (2 wheeled) Gait Pattern/deviations: Decreased step length - right;Decreased step length - left;Decreased stride length;Step-through pattern Gait velocity: decreased Gait velocity interpretation: Below normal speed for age/gender General Gait Details: pt with mild instability but no overt LOB or need for physical assistance, min guard for safety.  pt on 4L of supplemental O2 with SPO2 maintaining >95% throughout   Stairs            Wheelchair Mobility    Modified Rankin (Stroke Patients Only)       Balance Overall balance assessment: History of Falls;Needs assistance Sitting-balance support: Feet supported Sitting balance-Leahy Scale: Good     Standing balance support: During functional activity;Bilateral upper extremity supported Standing balance-Leahy Scale: Poor Standing balance comment: pt reliant on bilateral UEs on RW                            Cognition Arousal/Alertness: Awake/alert Behavior During Therapy: WFL for tasks assessed/performed Overall Cognitive Status: Within Functional Limits for tasks assessed                                        Exercises      General Comments        Pertinent Vitals/Pain Pain Assessment: No/denies pain    Home Living                      Prior Function            PT Goals (current goals can now be found in the care plan section) Acute Rehab PT Goals PT Goal Formulation: With patient Time For Goal Achievement: 04/19/17 Potential to Achieve Goals: Good Progress towards PT goals: Progressing toward goals    Frequency    Min 3X/week      PT Plan Current  plan remains appropriate    Co-evaluation              AM-PAC PT "6 Clicks" Daily Activity  Outcome Measure  Difficulty turning over in bed (including adjusting bedclothes, sheets and blankets)?: None Difficulty moving from lying on back to sitting on the side of the bed? : None Difficulty sitting down on and standing up from a chair with arms (e.g., wheelchair, bedside commode, etc,.)?: Total Help needed moving to and from a bed to chair (including a wheelchair)?: A Little Help needed walking in hospital room?: A Little Help needed climbing 3-5 steps with a railing? : A Little 6 Click Score: 18    End of Session Equipment Utilized During Treatment:  Gait belt;Oxygen (4L of O2) Activity Tolerance: Patient tolerated treatment well Patient left: in bed;with call bell/phone within reach;Other (comment) (IV team present) Nurse Communication: Mobility status PT Visit Diagnosis: Repeated falls (R29.6);Other abnormalities of gait and mobility (R26.89)     Time: 8864-8472 PT Time Calculation (min) (ACUTE ONLY): 28 min  Charges:  $Gait Training: 23-37 mins                    G Codes:       [place], [place], [place] 04/06/2017, 3:14 PM

## 2017-04-07 ENCOUNTER — Encounter (HOSPITAL_COMMUNITY)
Admission: EM | Disposition: A | Discharge status: Home Health | Payer: Self-pay | Source: Home / Self Care | Attending: Internal Medicine

## 2017-04-07 ENCOUNTER — Encounter (HOSPITAL_COMMUNITY): Payer: Self-pay | Admitting: Gastroenterology

## 2017-04-07 HISTORY — PX: GIVENS CAPSULE STUDY: SHX5432

## 2017-04-07 LAB — BASIC METABOLIC PANEL
ANION GAP: 7 (ref 5–15)
BUN: 18 mg/dL (ref 6–20)
CO2: 33 mmol/L — AB (ref 22–32)
Calcium: 8.4 mg/dL — ABNORMAL LOW (ref 8.9–10.3)
Chloride: 102 mmol/L (ref 101–111)
Creatinine, Ser: 1.17 mg/dL (ref 0.61–1.24)
GFR calc Af Amer: >60 mL/min (ref >60)
GFR calc non Af Amer: 59 mL/min — ABNORMAL LOW (ref >60)
GLUCOSE: 140 mg/dL — AB (ref 65–99)
POTASSIUM: 3.4 mmol/L — AB (ref 3.5–5.1)
Sodium: 142 mmol/L (ref 135–145)

## 2017-04-07 LAB — CBC
HEMATOCRIT: 30.8 % — AB (ref 39.0–52.0)
Hemoglobin: 9 g/dL — ABNORMAL LOW (ref 13.0–17.0)
MCH: 26.9 pg (ref 26.0–34.0)
MCHC: 29.2 g/dL — ABNORMAL LOW (ref 30.0–36.0)
MCV: 91.9 fL (ref 78.0–100.0)
Platelets: 149 10*3/uL — ABNORMAL LOW (ref 150–400)
RBC: 3.35 MIL/uL — ABNORMAL LOW (ref 4.22–5.81)
RDW: 20.1 % — ABNORMAL HIGH (ref 11.5–15.5)
WBC: 7.1 10*3/uL (ref 4.0–10.5)

## 2017-04-07 LAB — GLUCOSE, CAPILLARY
GLUCOSE-CAPILLARY: 174 mg/dL — AB (ref 65–99)
GLUCOSE-CAPILLARY: 118 mg/dL — AB (ref 65–99)
Glucose-Capillary: 139 mg/dL — ABNORMAL HIGH (ref 65–99)
Glucose-Capillary: 250 mg/dL — ABNORMAL HIGH (ref 65–99)

## 2017-04-07 SURGERY — IMAGING PROCEDURE, GI TRACT, INTRALUMINAL, VIA CAPSULE

## 2017-04-07 MED ORDER — POTASSIUM CHLORIDE CRYS ER 20 MEQ PO TBCR
40.0000 meq | EXTENDED_RELEASE_TABLET | Freq: Once | ORAL | Status: AC
  Administered 2017-04-07: 40 meq via ORAL
  Filled 2017-04-07: qty 2

## 2017-04-07 SURGICAL SUPPLY — 1 items: TOWEL COTTON PACK 4EA (MISCELLANEOUS) ×6 IMPLANT

## 2017-04-07 NOTE — Progress Notes (Signed)
Patient ingested givens capsule for small bowel study 04/07/2017 at [place] per Dr. Fuller Plan. Verbal and written instructions given to patient and primary RN. Patient must wear equipment until 04/07/2017 at [place]

## 2017-04-07 NOTE — Progress Notes (Signed)
Capsule endoscopy study in progress. Soonest CE reading would be 6/23, could be 6/24. Once results available will notify medical team and pt.    Problem list:  *  Acute on Chronic GI bleeding and associated GI blood loss anemia.  Innumerable endoscopic studies over many years.  S/p PRBC x 3, Hgb 8.2 >> 9.0.    EGD 6/21: Large HH. Mild non-bleeding erosive gastropathy associated with the hiatal hernia.  Small non-bleeding, duodenal angiodysplastic lesion, treated with APC.  Non-bleeding duodenal diverticulum.  Submucosal nodule in the duodenum. Capsule endoscopy dropped this morning, study will finish tonite.     Azucena Freed  PA-C

## 2017-04-07 NOTE — Progress Notes (Signed)
PROGRESS NOTE                                                                                                                                                                                                             Patient Demographics:    [name], is a 76 y.o. male, DOB - Nov 25, 1940, IHK:[id number]  Admit date - 04/03/2017   Admitting Physician Waldemar Dickens, MD  Outpatient Primary MD for the patient is [name], MD  LOS - 2   Chief Complaint  Patient presents with  . Shortness of Breath       Brief Narrative   76 y.o. male with medical history significant of diabetes, insomnia, hyperlipidemia, AAA, recurrent GI bleeds with history of AVM and duodenal ulceration, bipolar, CAD/MI/CABG, COPD, depression, hypertension, GERD. Patient presenting with recurrent GI bleed, Went for endoscopy 6/21, significant for nonbleeding erosive gastropathy, and single small nonbleeding angiodysplastic lesion.   Subjective:    [name] had Reports watery bowel movement overnight, but no melena, ports is feeling better,, no nausea, vomiting or abdominal pain.   Assessment  & Plan :    Active Problems:   Hyperlipidemia   Essential hypertension   COPD (chronic obstructive pulmonary disease) (HCC)   Chronic GI bleeding   Chronic hypoxemic respiratory failure (HCC)   Insomnia   Gastric AVM   AVM (arteriovenous malformation) of duodenum, acquired with hemorrhage   Symptomatic anemia   GIB (gastrointestinal bleeding)   GI bleed     GI bleed - Patient with known history of recurrent GI bleeds in the past secondary to known GI tract AVM mainly gastric and duodenal. - Presents with hemoglobin of 8, So far required total of 3 units PRBC transfusion, without significant increase of his hemoglobin, appears to be intermittent GI bleed, as initially with normal color stool, but with melena for last 2 days . 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 - Capsule endoscopy dropped this morning, study will finish tonight, will take 1-2 days for readings  Symptomatic acute blood loss/iron deficiency anemia:  - Iron deficiency anemia secondary to acute on chronic blood loss . - Transfused total of 3 units PRBC during hospital stay, hemoglobin 8 on admission, today is 9. -  Workup significant for iron deficiency anemia withlow iron and ferritin level, received IV ferahem 6/19    Hyperkalemia/hypokalemia -resolved after giving Kayexalate  - Replete as needed  COPD:  - Advanced. On 4-7L [place] at baseline. Followed by Dr. Lamonte Sakai of pulmonlogy. Treated w/ solumedrol and nebulizers upon arrival due to concerns of COPD exacerbation.  - continue home O2 of 4-7LNC - No further steroids - continue Dulera, Spiriva  History of present illness on CKD:  - baseline Cf 1.1, peaked at 1.5, improved with gentle hydration and holding torsemide  Abd pain:  - likely from GI bleed. Doubt infectious process but cannot entirely exclude. WBC 17.2. AFVSS.  - KUB with no acute abnormalities  Chest pain:  -Troponin negative on admission, currently denies any chest pain  HTN: - continue metoprolol, lisinopril  DM: - Controlled after increasing Lantus to 28 units twice a day, and starting 5 units NovoLog before meals, continue with insulin sliding scale . - Continue to hold metformin   CHF: - Holding torsemide given elevation of creatinine .  Depression: - continue xanax, Celexa  HLD: - continue lipitor  Peripheral neuropathy - continue neurontin  Insomnia: - continue trazodone, Ambien  Chronic pain: - continue tramadol    Code Status : Full  Family Communication  : None at bedside  Disposition Plan  : home when stable  Consults  :  GI  Procedures  : None  DVT Prophylaxis  :  SCDs     Lab Results  Component Value Date   PLT 149 (L) 04/07/2017    Antibiotics  :   Anti-infectives    None        Objective:   Vitals:   04/06/17 1927 04/06/17 2153 04/07/17 0534 04/07/17 0742  BP:  130/63 132/67   Pulse: 96 92 (!) 51   Resp: 18 18 19    Temp:  97.5 F (36.4 C) 99.1 F (37.3 C)   TempSrc:      SpO2: 98% 98% 97%   Weight:      Height:    6\' 3"  (1.905 m)    Wt Readings from Last 3 Encounters:  04/03/17 118 kg (260 lb 3.2 oz)  03/30/17 116.8 kg (257 lb 8 oz)  02/22/17 116.1 kg (256 lb)     Intake/Output Summary (Last 24 hours) at 04/07/17 1200 Last data filed at 04/07/17 0811  Gross per 24 hour  Intake             3329 ml  Output             3125 ml  Net              204 ml     Physical Exam  Awake Alert, Oriented X 3,  Good air entry bilaterally, clear to auscultation, no wheezing, no use of accessory muscle, Stable Regular rate and rhythm, no rubs murmurs gallops, stable Abdomen soft, nontender, nondistended, bowel sounds present, stable No Cyanosis, Clubbing or edema, No new Rash or bruise , stable    Data Review:    CBC  Recent Labs Lab 04/03/17 1201  04/04/17 1926 04/05/17 0537 04/05/17 1442 04/06/17 0639 04/07/17 0410  WBC 17.2*  --  16.8* 10.7*  --  6.9 7.1  HGB 8.0*  < > 8.2* 8.9* 9.8* 9.2* 9.0*  HCT 27.7*  < > 27.4* 29.0* 32.1* 30.4* 30.8*  PLT 295  --  223 175  --  157 149*  MCV 87.9  --  86.2  87.9  --  88.6 91.9  MCH 25.4*  --  25.8* 27.0  --  26.8 26.9  MCHC 28.9*  --  29.9* 30.7  --  30.3 29.2*  RDW 17.4*  --  19.6* 19.3*  --  19.7* 20.1*  LYMPHSABS 2.4  --   --   --   --   --   --   MONOABS 1.2*  --   --   --   --   --   --   EOSABS 0.2  --   --   --   --   --   --   BASOSABS 0.0  --   --   --   --   --   --   < > = values in this interval not displayed.  Chemistries   Recent Labs Lab 04/03/17 1201 04/03/17 2221 04/04/17 0736 04/05/17 0537 04/06/17 0639 04/07/17 0410  NA 137  --  132* 134* 138 142  K  4.9  --  5.4* 3.9 3.7 3.4*  CL 97*  --  98* 97* 98* 102  CO2 31  --  24 26 30  33*  GLUCOSE 261* 517* 315* 241* 172* 140*  BUN 45*  --  49* 47* 38* 18  CREATININE 1.33*  --  1.34* 1.44* 1.53* 1.17  CALCIUM 8.8*  --  8.7* 8.2* 8.3* 8.4*  AST 13*  --   --   --   --   --   ALT 10*  --   --   --   --   --   ALKPHOS 116  --   --   --   --   --   BILITOT 0.6  --   --   --   --   --    ------------------------------------------------------------------------------------------------------------------ No results for input(s): CHOL, HDL, LDLCALC, TRIG, CHOLHDL, LDLDIRECT in the last 72 hours.  Lab Results  Component Value Date   HGBA1C 7.8 (H) 02/23/2017   ------------------------------------------------------------------------------------------------------------------ No results for input(s): TSH, T4TOTAL, T3FREE, THYROIDAB in the last 72 hours.  Invalid input(s): FREET3 ------------------------------------------------------------------------------------------------------------------ No results for input(s): VITAMINB12, FOLATE, FERRITIN, TIBC, IRON, RETICCTPCT in the last 72 hours.  Coagulation profile No results for input(s): INR, PROTIME in the last 168 hours.  No results for input(s): DDIMER in the last 72 hours.  Cardiac Enzymes  Recent Labs Lab 04/03/17 1201  TROPONINI <0.03   ------------------------------------------------------------------------------------------------------------------    Component Value Date/Time   BNP 84.2 04/03/2017 1201    Inpatient Medications  Scheduled Meds: . atorvastatin  40 mg Oral QHS  . citalopram  40 mg Oral Daily  . feeding supplement (GLUCERNA SHAKE)  237 mL Oral TID BM  . gabapentin  300 mg Oral TID  . insulin aspart  0-15 Units Subcutaneous TID WC  . insulin aspart  0-5 Units Subcutaneous QHS  . insulin aspart  5 Units Subcutaneous TID WC  . insulin glargine  28 Units Subcutaneous BID  . mometasone-formoterol  2 puff Inhalation  BID  . pantoprazole  40 mg Oral BID AC   Continuous Infusions: . sodium chloride    . sodium chloride 50 mL/hr at 04/06/17 1421   PRN Meds:.acetaminophen **OR** acetaminophen, ALPRAZolam, hydrALAZINE, ipratropium-albuterol, ondansetron **OR** ondansetron (ZOFRAN) IV, tiotropium, traMADol, traZODone, zolpidem  Micro Results No results found for this or any previous visit (from the past 240 hour(s)).  Radiology Reports Dg Chest 2 View  Result Date: 04/03/2017 CLINICAL DATA:  Shortness of breath. EXAM: CHEST  2 VIEW COMPARISON:  Radiographs of March 30, 2017. FINDINGS: The heart size and mediastinal contours are within normal limits. Both lungs are clear. Status post coronary artery bypass graft. No pneumothorax or pleural effusion is noted. Large hiatal hernia is noted. The visualized skeletal structures are unremarkable. IMPRESSION: No active cardiopulmonary disease.  Large hiatal hernia. Electronically Signed   By: Marijo Conception, M.D.   On: 04/03/2017 11:11   Dg Chest 2 View  Result Date: 03/30/2017 CLINICAL DATA:  Shortness of breath EXAM: CHEST  2 VIEW COMPARISON:  02/22/2017 FINDINGS: Previous median sternotomy and CABG procedure. Normal heart size. No pleural effusion or edema identified. No airspace consolidation identified. IMPRESSION: 1. No acute cardiopulmonary abnormalities. 2. Hiatal hernia. Electronically Signed   By: Kerby Moors M.D.   On: 03/30/2017 13:58   Dg Abd 1 View  Result Date: 04/03/2017 CLINICAL DATA:  Intermittent left lower quadrant abdominal pain. EXAM: ABDOMEN - 1 VIEW COMPARISON:  CT abdomen pelvis 04/22/2015. CT abdomen and pelvis 12/29/2014, 05/19/2014 and earlier. FINDINGS: Bowel gas pattern unremarkable without evidence of obstruction or significant ileus. Moderate stool burden in the colon. No visible opaque urinary tract calculi. Prior aorto iliac stent grafting and coil embolization of the right internal iliac artery. Surgical suture material in the  pelvis. IMPRESSION: No acute abdominal abnormality. Electronically Signed   By: Evangeline Dakin M.D.   On: 04/03/2017 18:13     Alexander Aument M.D on 04/07/2017 at 12:00 PM  Between 7am to 7pm - Pager - [phone]  After 7pm go to [email or web address] - password [facility]  [facility] -  Office  [phone]

## 2017-04-08 DIAGNOSIS — K5521 Angiodysplasia of colon with hemorrhage: Secondary | ICD-10-CM

## 2017-04-08 LAB — BASIC METABOLIC PANEL
Anion gap: 10 (ref 5–15)
BUN: 14 mg/dL (ref 6–20)
CHLORIDE: 105 mmol/L (ref 101–111)
CO2: 25 mmol/L (ref 22–32)
CREATININE: 1.11 mg/dL (ref 0.61–1.24)
Calcium: 7.9 mg/dL — ABNORMAL LOW (ref 8.9–10.3)
GFR calc Af Amer: >60 mL/min (ref >60)
GFR calc non Af Amer: >60 mL/min (ref >60)
Glucose, Bld: 190 mg/dL — ABNORMAL HIGH (ref 65–99)
Potassium: 3.9 mmol/L (ref 3.5–5.1)
Sodium: 140 mmol/L (ref 135–145)

## 2017-04-08 LAB — GLUCOSE, CAPILLARY
Glucose-Capillary: 255 mg/dL — ABNORMAL HIGH (ref 65–99)
Glucose-Capillary: 136 mg/dL — ABNORMAL HIGH (ref 65–99)
Glucose-Capillary: 228 mg/dL — ABNORMAL HIGH (ref 65–99)
Glucose-Capillary: 160 mg/dL — ABNORMAL HIGH (ref 65–99)
Glucose-Capillary: 128 mg/dL — ABNORMAL HIGH (ref 65–99)

## 2017-04-08 LAB — CBC
HEMATOCRIT: 30.9 % — AB (ref 39.0–52.0)
HEMOGLOBIN: 9 g/dL — AB (ref 13.0–17.0)
MCH: 26.8 pg (ref 26.0–34.0)
MCHC: 29.1 g/dL — AB (ref 30.0–36.0)
MCV: 92 fL (ref 78.0–100.0)
Platelets: 146 10*3/uL — ABNORMAL LOW (ref 150–400)
RBC: 3.36 MIL/uL — ABNORMAL LOW (ref 4.22–5.81)
RDW: 20.6 % — ABNORMAL HIGH (ref 11.5–15.5)
WBC: 7.4 10*3/uL (ref 4.0–10.5)

## 2017-04-08 MED ORDER — PEG-KCL-NACL-NASULF-NA ASC-C 100 G PO SOLR
0.5000 | Freq: Once | ORAL | Status: AC
  Administered 2017-04-08: 100 g via ORAL
  Filled 2017-04-08: qty 1

## 2017-04-08 MED ORDER — PEG-KCL-NACL-NASULF-NA ASC-C 100 G PO SOLR: 1.0000 | Freq: Once | ORAL | Status: DC

## 2017-04-08 MED ORDER — TORSEMIDE 20 MG PO TABS
40.0000 mg | ORAL_TABLET | Freq: Every day | ORAL | Status: DC
  Administered 2017-04-08 – 2017-04-10 (×3): 40 mg via ORAL
  Filled 2017-04-08 (×3): qty 2

## 2017-04-08 MED ORDER — PEG-KCL-NACL-NASULF-NA ASC-C 100 G PO SOLR
0.5000 | Freq: Once | ORAL | Status: AC
  Administered 2017-04-08: 100 g via ORAL

## 2017-04-08 NOTE — Progress Notes (Addendum)
    Progress Note   Subjective   Brown bowel movement overnight. Capsule endoscopy read today and shows a few cecal/right colon AVMs.    Objective  Vital signs in last 24 hours: Temp:  [97.5 F (36.4 C)-98.3 F (36.8 C)] 97.5 F (36.4 C) (06/23 0525) Pulse Rate:  [55-109] 55 (06/23 0525) Resp:  [16-18] 18 (06/23 0525) BP: (123-139)/(66-89) 123/89 (06/23 0525) SpO2:  [95 %-99 %] 99 % (06/23 0525) Last BM Date: 04/07/17  General: Alert, well-developed, in NAD Heart:  Regular rate and rhythm; no murmurs Chest: Clear to ascultation bilaterally Abdomen:  Soft, nontender and nondistended. Normal bowel sounds, without guarding, and without rebound.   Extremities:  Without edema. Neurologic:  Alert and  oriented x4; grossly normal neurologically. Psych:  Alert and cooperative. Normal mood and affect.  Intake/Output from previous day: 06/22 0701 - 06/23 0700 In: 2406.7 [P.O.:1840; I.V.:566.7] Out: 1400 [Urine:1400] Intake/Output this shift: Total I/O In: -  Out: 200 [Urine:200]  Lab Results:  Recent Labs  04/06/17 0639 04/07/17 0410 04/08/17 0416  WBC 6.9 7.1 7.4  HGB 9.2* 9.0* 9.0*  HCT 30.4* 30.8* 30.9*  PLT 157 149* 146*   BMET  Recent Labs  04/06/17 0639 04/07/17 0410 04/08/17 0416  NA 138 142 140  K 3.7 3.4* 3.9  CL 98* 102 105  CO2 30 33* 25  GLUCOSE 172* 140* 190*  BUN 38* 18 14  CREATININE 1.53* 1.17 1.11  CALCIUM 8.3* 8.4* 7.9*      Assessment & Plan   1. ABL anemia and chronic iron deficiency anemia due to chronic intermittent GI blood loss. Capsule endoscopy shows a few cecal/right colon AVMs. EGD showed small Cameron erosions and a small duodenal AVM which was ablated. Pt received IV Feraheme and 3 U PRBCs this admission. Hb now stable at 9. Schedule colonoscopy for tomorrow. The risks (including bleeding, perforation, infection, missed lesions, medication reactions and possible hospitalization or surgery if complications occur), benefits, and  alternatives to colonoscopy with possible biopsy and possible polypectomy were discussed with the patient and they consent to proceed. Given his severe COPD/O2 needs he is very high risk for sedation related complications.   Active Problems:   Hyperlipidemia   Essential hypertension   COPD (chronic obstructive pulmonary disease) (HCC)   Chronic GI bleeding   Chronic hypoxemic respiratory failure (HCC)   Insomnia   Gastric AVM   AVM (arteriovenous malformation) of duodenum, acquired with hemorrhage   Symptomatic anemia   GIB (gastrointestinal bleeding)   GI bleed    LOS: 3 days   Arnitra Sokoloski T. Fuller Plan MD 04/08/2017, 1:57 PM

## 2017-04-08 NOTE — Progress Notes (Signed)
PROGRESS NOTE                                                                                                                                                                                                             Patient Demographics:    [name], is a 76 y.o. male, DOB - [birth year], LMB:[id number]  Admit date - 04/03/2017   Admitting Physician Waldemar Dickens, MD  Outpatient Primary MD for the patient is [name], MD  LOS - 3   Chief Complaint  Patient presents with  . Shortness of Breath       Brief Narrative   76 y.o. male with medical history significant of diabetes, insomnia, hyperlipidemia, AAA, recurrent GI bleeds with history of AVM and duodenal ulceration, bipolar, CAD/MI/CABG, COPD, depression, hypertension, GERD. Patient presenting with recurrent GI bleed, Went for endoscopy 6/21, significant for nonbleeding erosive gastropathy, and single small nonbleeding angiodysplastic lesion.   Subjective:    [name] had Reports Normal colored bowel movement overnight, denies any chest pain, shortness of breath, no nausea, no vomiting, no abdominal pain    Assessment  & Plan :    Active Problems:   Hyperlipidemia   Essential hypertension   COPD (chronic obstructive pulmonary disease) (HCC)   Chronic GI bleeding   Chronic hypoxemic respiratory failure (HCC)   Insomnia   Gastric AVM   AVM (arteriovenous malformation) of duodenum, acquired with hemorrhage   Symptomatic anemia   GIB (gastrointestinal bleeding)   GI bleed     GI bleed - Patient with known history of recurrent GI bleeds in the past secondary to known GI tract AVM mainly gastric and duodenal. - Presents with hemoglobin of 8, So far required total of 3 units PRBC transfusion, Hemoglobin remained stable over the last 72 hours, he reports normal colored stools today . 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 - Capsule endoscopy performed yesterday, discussed with Dr. Eden Lathe, hopefully will have results by end of the day or tomorrow .  Symptomatic acute blood loss/iron deficiency anemia:  - Iron deficiency anemia secondary to acute on chronic blood loss . - Transfused total of 3 units PRBC during hospital stay, hemoglobin 8 on admission, Hgb remained stable at 9 today . -  Workup significant for iron deficiency anemia withlow iron and ferritin level, received IV ferahem 6/19    Hyperkalemia/hypokalemia -resolved after giving Kayexalate  - Replete as needed  COPD:  - Advanced. On 4-7L [place] at baseline. Followed by Dr. Lamonte Sakai of pulmonlogy. Treated w/ solumedrol and nebulizers upon arrival due to concerns of COPD exacerbation.  - continue home O2 of 4-7LNC - No further steroids - continue Dulera, Spiriva  History of present illness on CKD:  - baseline Cf 1.1, peaked at 1.5, improved with gentle hydration and holding torsemide, back to baseline at 1.1 today, so we'll resume on torsemide  Abd pain:  - likely from GI bleed. Doubt infectious process but cannot entirely exclude. WBC 17.2. AFVSS.  - KUB with no acute abnormalities  Chest pain:  -Troponin negative on admission, currently denies any chest pain  HTN: - continue metoprolol, lisinopril  DM: - Controlled after increasing Lantus to 28 units twice a day, and starting 5 units NovoLog before meals, continue with insulin sliding scale . - Continue to hold metformin   CHF: - We'll resume on torsemide today giving stable creatinine  Depression: - continue xanax, Celexa  HLD: - continue lipitor  Peripheral neuropathy - continue neurontin  Insomnia: - continue trazodone, Ambien  Chronic pain: - continue tramadol    Code Status : Full  Family Communication  : None at bedside  Disposition Plan  : home when  stable  Consults  :  GI  Procedures  : None  DVT Prophylaxis  :  SCDs   Lab Results  Component Value Date   PLT 146 (L) 04/08/2017    Antibiotics  :   Anti-infectives    None        Objective:   Vitals:   04/07/17 1531 04/07/17 2029 04/07/17 2147 04/08/17 0525  BP: 137/77  139/66 123/89  Pulse: 89 (!) 109 (!) 59 (!) 55  Resp: 16 18 18 18   Temp: 97.8 F (36.6 C)  98.3 F (36.8 C) 97.5 F (36.4 C)  TempSrc: Oral     SpO2: 95% 97% 97% 99%  Weight:      Height:        Wt Readings from Last 3 Encounters:  04/03/17 118 kg (260 lb 3.2 oz)  03/30/17 116.8 kg (257 lb 8 oz)  02/22/17 116.1 kg (256 lb)     Intake/Output Summary (Last 24 hours) at 04/08/17 1225 Last data filed at 04/08/17 0622  Gross per 24 hour  Intake          1966.67 ml  Output             1400 ml  Net           566.67 ml     Physical Exam  Awake alert oriented 3  Good air entry bilaterally, clear to auscultation, no use of accessory muscle  Regular rate and rhythm, no rubs murmurs gallops  Abdomen soft, nontender, nondistended, bowel sounds present  Ext  with no edema, clubbing or cyanosis     Data Review:    CBC  Recent Labs Lab 04/03/17 1201  04/04/17 1926 04/05/17 0537 04/05/17 1442 04/06/17 0639 04/07/17 0410 04/08/17 0416  WBC 17.2*  --  16.8* 10.7*  --  6.9 7.1 7.4  HGB 8.0*  < > 8.2* 8.9* 9.8* 9.2* 9.0* 9.0*  HCT 27.7*  < > 27.4* 29.0* 32.1* 30.4* 30.8* 30.9*  PLT 295  --  223 175  --  157 149* 146*  MCV 87.9  --  86.2 87.9  --  88.6 91.9 92.0  MCH 25.4*  --  25.8* 27.0  --  26.8 26.9 26.8  MCHC 28.9*  --  29.9* 30.7  --  30.3 29.2* 29.1*  RDW 17.4*  --  19.6* 19.3*  --  19.7* 20.1* 20.6*  LYMPHSABS 2.4  --   --   --   --   --   --   --   MONOABS 1.2*  --   --   --   --   --   --   --   EOSABS 0.2  --   --   --   --   --   --   --   BASOSABS 0.0  --   --   --   --   --   --   --   < > = values in this interval not displayed.  Chemistries   Recent Labs Lab  04/03/17 1201  04/04/17 0736 04/05/17 0537 04/06/17 0639 04/07/17 0410 04/08/17 0416  NA 137  --  132* 134* 138 142 140  K 4.9  --  5.4* 3.9 3.7 3.4* 3.9  CL 97*  --  98* 97* 98* 102 105  CO2 31  --  24 26 30  33* 25  GLUCOSE 261*  < > 315* 241* 172* 140* 190*  BUN 45*  --  49* 47* 38* 18 14  CREATININE 1.33*  --  1.34* 1.44* 1.53* 1.17 1.11  CALCIUM 8.8*  --  8.7* 8.2* 8.3* 8.4* 7.9*  AST 13*  --   --   --   --   --   --   ALT 10*  --   --   --   --   --   --   ALKPHOS 116  --   --   --   --   --   --   BILITOT 0.6  --   --   --   --   --   --   < > = values in this interval not displayed. ------------------------------------------------------------------------------------------------------------------ No results for input(s): CHOL, HDL, LDLCALC, TRIG, CHOLHDL, LDLDIRECT in the last 72 hours.  Lab Results  Component Value Date   HGBA1C 7.8 (H) 02/23/2017   ------------------------------------------------------------------------------------------------------------------ No results for input(s): TSH, T4TOTAL, T3FREE, THYROIDAB in the last 72 hours.  Invalid input(s): FREET3 ------------------------------------------------------------------------------------------------------------------ No results for input(s): VITAMINB12, FOLATE, FERRITIN, TIBC, IRON, RETICCTPCT in the last 72 hours.  Coagulation profile No results for input(s): INR, PROTIME in the last 168 hours.  No results for input(s): DDIMER in the last 72 hours.  Cardiac Enzymes  Recent Labs Lab 04/03/17 1201  TROPONINI <0.03   ------------------------------------------------------------------------------------------------------------------    Component Value Date/Time   BNP 84.2 04/03/2017 1201    Inpatient Medications  Scheduled Meds: . atorvastatin  40 mg Oral QHS  . citalopram  40 mg Oral Daily  . feeding supplement (GLUCERNA SHAKE)  237 mL Oral TID BM  . gabapentin  300 mg Oral TID  . insulin  aspart  0-15 Units Subcutaneous TID WC  . insulin aspart  0-5 Units Subcutaneous QHS  . insulin aspart  5 Units Subcutaneous TID WC  . insulin glargine  28 Units Subcutaneous BID  . mometasone-formoterol  2 puff Inhalation BID  . pantoprazole  40 mg Oral BID AC  . torsemide  40 mg Oral Daily   Continuous Infusions: . sodium chloride    . sodium  chloride 50 mL/hr at 04/07/17 1554   PRN Meds:.acetaminophen **OR** acetaminophen, ALPRAZolam, hydrALAZINE, ipratropium-albuterol, ondansetron **OR** ondansetron (ZOFRAN) IV, tiotropium, traMADol, traZODone, zolpidem  Micro Results No results found for this or any previous visit (from the past 240 hour(s)).  Radiology Reports Dg Chest 2 View  Result Date: 04/03/2017 CLINICAL DATA:  Shortness of breath. EXAM: CHEST  2 VIEW COMPARISON:  Radiographs of March 30, 2017. FINDINGS: The heart size and mediastinal contours are within normal limits. Both lungs are clear. Status post coronary artery bypass graft. No pneumothorax or pleural effusion is noted. Large hiatal hernia is noted. The visualized skeletal structures are unremarkable. IMPRESSION: No active cardiopulmonary disease.  Large hiatal hernia. Electronically Signed   By: Marijo Conception, M.D.   On: 04/03/2017 11:11   Dg Chest 2 View  Result Date: 03/30/2017 CLINICAL DATA:  Shortness of breath EXAM: CHEST  2 VIEW COMPARISON:  02/22/2017 FINDINGS: Previous median sternotomy and CABG procedure. Normal heart size. No pleural effusion or edema identified. No airspace consolidation identified. IMPRESSION: 1. No acute cardiopulmonary abnormalities. 2. Hiatal hernia. Electronically Signed   By: Kerby Moors M.D.   On: 03/30/2017 13:58   Dg Abd 1 View  Result Date: 04/03/2017 CLINICAL DATA:  Intermittent left lower quadrant abdominal pain. EXAM: ABDOMEN - 1 VIEW COMPARISON:  CT abdomen pelvis 04/22/2015. CT abdomen and pelvis 12/29/2014, 05/19/2014 and earlier. FINDINGS: Bowel gas pattern unremarkable  without evidence of obstruction or significant ileus. Moderate stool burden in the colon. No visible opaque urinary tract calculi. Prior aorto iliac stent grafting and coil embolization of the right internal iliac artery. Surgical suture material in the pelvis. IMPRESSION: No acute abdominal abnormality. Electronically Signed   By: Evangeline Dakin M.D.   On: 04/03/2017 18:13     Victoriano Campion M.D on 04/08/2017 at 12:25 PM  Between 7am to 7pm - Pager - [phone]  After 7pm go to [email or web address] - password [facility]  [facility] -  Office  [phone]

## 2017-04-09 ENCOUNTER — Encounter (HOSPITAL_COMMUNITY): Payer: Self-pay | Admitting: Gastroenterology

## 2017-04-09 ENCOUNTER — Encounter (HOSPITAL_COMMUNITY)
Admission: EM | Disposition: A | Discharge status: Home Health | Payer: Self-pay | Source: Home / Self Care | Attending: Internal Medicine

## 2017-04-09 DIAGNOSIS — D124 Benign neoplasm of descending colon: Secondary | ICD-10-CM

## 2017-04-09 DIAGNOSIS — D125 Benign neoplasm of sigmoid colon: Secondary | ICD-10-CM

## 2017-04-09 DIAGNOSIS — K921 Melena: Secondary | ICD-10-CM

## 2017-04-09 HISTORY — PX: HOT HEMOSTASIS: SHX5433

## 2017-04-09 HISTORY — PX: COLONOSCOPY: SHX5424

## 2017-04-09 LAB — BASIC METABOLIC PANEL
Anion gap: 12 (ref 5–15)
BUN: 8 mg/dL (ref 6–20)
CALCIUM: 8.2 mg/dL — AB (ref 8.9–10.3)
CO2: 22 mmol/L (ref 22–32)
CREATININE: 1.08 mg/dL (ref 0.61–1.24)
Chloride: 104 mmol/L (ref 101–111)
GFR calc Af Amer: >60 mL/min (ref >60)
GLUCOSE: 148 mg/dL — AB (ref 65–99)
Potassium: 4.1 mmol/L (ref 3.5–5.1)
Sodium: 138 mmol/L (ref 135–145)

## 2017-04-09 LAB — CBC
HCT: 32.7 % — ABNORMAL LOW (ref 39.0–52.0)
Hemoglobin: 9.7 g/dL — ABNORMAL LOW (ref 13.0–17.0)
MCH: 27.2 pg (ref 26.0–34.0)
MCHC: 29.7 g/dL — AB (ref 30.0–36.0)
MCV: 91.9 fL (ref 78.0–100.0)
Platelets: 148 10*3/uL — ABNORMAL LOW (ref 150–400)
RBC: 3.56 MIL/uL — AB (ref 4.22–5.81)
RDW: 21.5 % — ABNORMAL HIGH (ref 11.5–15.5)
WBC: 9.7 10*3/uL (ref 4.0–10.5)

## 2017-04-09 LAB — GLUCOSE, CAPILLARY
GLUCOSE-CAPILLARY: 109 mg/dL — AB (ref 65–99)
Glucose-Capillary: 206 mg/dL — ABNORMAL HIGH (ref 65–99)
Glucose-Capillary: 252 mg/dL — ABNORMAL HIGH (ref 65–99)

## 2017-04-09 SURGERY — EGD, WITH ARGON PLASMA COAGULATION
Anesthesia: Moderate Sedation

## 2017-04-09 MED ORDER — MIDAZOLAM HCL 10 MG/2ML IJ SOLN
INTRAMUSCULAR | Status: DC | PRN
  Administered 2017-04-09: 2 mg via INTRAVENOUS
  Administered 2017-04-09: 1 mg via INTRAVENOUS
  Administered 2017-04-09: 2 mg via INTRAVENOUS

## 2017-04-09 MED ORDER — MIDAZOLAM HCL 5 MG/ML IJ SOLN
INTRAMUSCULAR | Status: AC
  Filled 2017-04-09: qty 2

## 2017-04-09 MED ORDER — FENTANYL CITRATE (PF) 100 MCG/2ML IJ SOLN
INTRAMUSCULAR | Status: AC
  Filled 2017-04-09: qty 4

## 2017-04-09 MED ORDER — FENTANYL CITRATE (PF) 100 MCG/2ML IJ SOLN
INTRAMUSCULAR | Status: DC | PRN
  Administered 2017-04-09 (×2): 25 ug via INTRAVENOUS

## 2017-04-09 SURGICAL SUPPLY — 22 items

## 2017-04-09 NOTE — Progress Notes (Signed)
Patient rude, verbally using inappropriate language to RN. Charge RN notified and spoke with patient.

## 2017-04-09 NOTE — Progress Notes (Signed)
PROGRESS NOTE                                                                                                                                                                                                             Patient Demographics:    [name], is a 76 y.o. male, DOB - [birth year], WVP:[id number]  Admit date - 04/03/2017   Admitting Physician Waldemar Dickens, MD  Outpatient Primary MD for the patient is [name], MD  LOS - 4   Chief Complaint  Patient presents with  . Shortness of Breath       Brief Narrative   76 y.o. male with medical history significant of diabetes, insomnia, hyperlipidemia, AAA, recurrent GI bleeds with history of AVM and duodenal ulceration, bipolar, CAD/MI/CABG, COPD, depression, hypertension, GERD. Patient presenting with recurrent GI bleed, Went for endoscopy 6/21, significant for nonbleeding erosive gastropathy, and single small nonbleeding angiodysplastic lesion, went for colonoscopy 6/24, significant for polyps and AVM status post APC   Subjective:    [name] had Reports Loose clear bowel movements secondary to bowel prep, denies any abdominal pain, shortness of breath, palpitation or lightheadedness    Assessment  & Plan :    Active Problems:   Hyperlipidemia   Essential hypertension   COPD (chronic obstructive pulmonary disease) (HCC)   Chronic GI bleeding   Chronic hypoxemic respiratory failure (HCC)   Insomnia   Gastric AVM   AVM (arteriovenous malformation) of duodenum, acquired with hemorrhage   Symptomatic anemia   GIB (gastrointestinal bleeding)   GI bleed   AVM (arteriovenous malformation) of colon with hemorrhage   Melena   Benign neoplasm of sigmoid colon   Benign neoplasm of descending colon     GI bleed - Patient with known history of recurrent GI bleeds in the past secondary to known GI tract AVM mainly gastric and duodenal. - Presents with hemoglobin of 8, So far  required total of 3 units PRBC transfusion, Hemoglobin remained stable over the last 72 hours, he reports normal colored stools today . 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 - Capsule endoscopy shows a few cecal/right colon AVMs - Colonoscopy 6/24 showing polyps, mild diverticulosis with no evidence of active bleed, and 3  colonic angiodysplastic lesions were treated with argon plasma coagulation. - Very likely patient will continue to have recurrent chronic GI bleed. GI, this will be managed deeply with transfusions and IV iron  Symptomatic acute blood loss/iron deficiency anemia:  - Iron deficiency anemia secondary to acute on chronic blood loss . - Transfused total of 3 units PRBC during hospital stay, hemoglobin 8 on admission, Hgb remained stable at 9 today . - Workup significant for iron deficiency anemia withlow iron and ferritin level, received IV ferahem 6/19    Hyperkalemia/hypokalemia -resolved after giving Kayexalate  - Replete as needed  COPD:  - Advanced. On 4-7L [place] at baseline. Followed by Dr. Lamonte Sakai of pulmonlogy. Treated w/ solumedrol and nebulizers upon arrival due to concerns of COPD exacerbation.  - continue home O2 of 4-7LNC - No further steroids - continue Dulera, Spiriva  History of present illness on CKD:  - baseline Cf 1.1, peaked at 1.5, improved with gentle hydration and holding torsemide, recheck BMP in a.m. as resumed on torsemide   Abd pain:  - likely from GI bleed. Doubt infectious process but cannot entirely exclude. WBC 17.2. AFVSS.  - KUB with no acute abnormalities  Chest pain:  -Troponin negative on admission, currently denies any chest pain  HTN: - continue metoprolol, lisinopril  DM: - Controlled after increasing Lantus to 28 units twice a day, and starting 5 units NovoLog before meals, continue with  insulin sliding scale . - Continue to hold metformin   CHF: - Appears to be euvolemic, resumed on torsemide   Depression: - continue xanax, Celexa  HLD: - continue lipitor  Peripheral neuropathy - continue neurontin  Insomnia: - continue trazodone, Ambien  Chronic pain: - continue tramadol    Code Status : Full  Family Communication  : None at bedside  Disposition Plan  : home when stable  Consults  :  GI  Procedures  : EGD, capsule endoscopy, colonoscopy  DVT Prophylaxis  :  SCDs   Lab Results  Component Value Date   PLT 148 (L) 04/09/2017    Antibiotics  :   Anti-infectives    None        Objective:   Vitals:   04/09/17 1145 04/09/17 1150 04/09/17 1155 04/09/17 1205  BP: (!) 143/92 (!) 142/125 (!) 145/84 (!) 155/88  Pulse: 90 88 88 87  Resp: 17 18 17 15   Temp:      TempSrc:      SpO2: 99% 100% 98% 97%  Weight:      Height:        Wt Readings from Last 3 Encounters:  04/03/17 118 kg (260 lb 3.2 oz)  03/30/17 116.8 kg (257 lb 8 oz)  02/22/17 116.1 kg (256 lb)     Intake/Output Summary (Last 24 hours) at 04/09/17 1359 Last data filed at 04/08/17 2335  Gross per 24 hour  Intake              425 ml  Output              853 ml  Net             -428 ml     Physical Exam  Awake alert oriented 3, in no apparent distress Good entry bilaterally, no wheezing rales or rhonchi Regular rate and rhythm, no rubs, murmurs or gallops Abdomen soft, nontender, nondistended, bowel sounds present Extremities with no edema, clubbing or cyanosis Intact judgment and insight,    Data Review:  CBC  Recent Labs Lab 04/03/17 1201  04/05/17 0537 04/05/17 1442 04/06/17 0639 04/07/17 0410 04/08/17 0416 04/09/17 0354  WBC 17.2*  < > 10.7*  --  6.9 7.1 7.4 9.7  HGB 8.0*  < > 8.9* 9.8* 9.2* 9.0* 9.0* 9.7*  HCT 27.7*  < > 29.0* 32.1* 30.4* 30.8* 30.9* 32.7*  PLT 295  < > 175  --  157 149* 146* 148*  MCV 87.9  < > 87.9  --  88.6 91.9 92.0  91.9  MCH 25.4*  < > 27.0  --  26.8 26.9 26.8 27.2  MCHC 28.9*  < > 30.7  --  30.3 29.2* 29.1* 29.7*  RDW 17.4*  < > 19.3*  --  19.7* 20.1* 20.6* 21.5*  LYMPHSABS 2.4  --   --   --   --   --   --   --   MONOABS 1.2*  --   --   --   --   --   --   --   EOSABS 0.2  --   --   --   --   --   --   --   BASOSABS 0.0  --   --   --   --   --   --   --   < > = values in this interval not displayed.  Chemistries   Recent Labs Lab 04/03/17 1201  04/04/17 0736 04/05/17 0537 04/06/17 0639 04/07/17 0410 04/08/17 0416  NA 137  --  132* 134* 138 142 140  K 4.9  --  5.4* 3.9 3.7 3.4* 3.9  CL 97*  --  98* 97* 98* 102 105  CO2 31  --  24 26 30  33* 25  GLUCOSE 261*  < > 315* 241* 172* 140* 190*  BUN 45*  --  49* 47* 38* 18 14  CREATININE 1.33*  --  1.34* 1.44* 1.53* 1.17 1.11  CALCIUM 8.8*  --  8.7* 8.2* 8.3* 8.4* 7.9*  AST 13*  --   --   --   --   --   --   ALT 10*  --   --   --   --   --   --   ALKPHOS 116  --   --   --   --   --   --   BILITOT 0.6  --   --   --   --   --   --   < > = values in this interval not displayed. ------------------------------------------------------------------------------------------------------------------ No results for input(s): CHOL, HDL, LDLCALC, TRIG, CHOLHDL, LDLDIRECT in the last 72 hours.  Lab Results  Component Value Date   HGBA1C 7.8 (H) 02/23/2017   ------------------------------------------------------------------------------------------------------------------ No results for input(s): TSH, T4TOTAL, T3FREE, THYROIDAB in the last 72 hours.  Invalid input(s): FREET3 ------------------------------------------------------------------------------------------------------------------ No results for input(s): VITAMINB12, FOLATE, FERRITIN, TIBC, IRON, RETICCTPCT in the last 72 hours.  Coagulation profile No results for input(s): INR, PROTIME in the last 168 hours.  No results for input(s): DDIMER in the last 72 hours.  Cardiac Enzymes  Recent  Labs Lab 04/03/17 1201  TROPONINI <0.03   ------------------------------------------------------------------------------------------------------------------    Component Value Date/Time   BNP 84.2 04/03/2017 1201    Inpatient Medications  Scheduled Meds: . atorvastatin  40 mg Oral QHS  . citalopram  40 mg Oral Daily  . feeding supplement (GLUCERNA SHAKE)  237 mL Oral TID BM  . gabapentin  300 mg Oral TID  .  insulin aspart  0-15 Units Subcutaneous TID WC  . insulin aspart  0-5 Units Subcutaneous QHS  . insulin aspart  5 Units Subcutaneous TID WC  . insulin glargine  28 Units Subcutaneous BID  . mometasone-formoterol  2 puff Inhalation BID  . pantoprazole  40 mg Oral BID AC  . torsemide  40 mg Oral Daily   Continuous Infusions: . sodium chloride    . sodium chloride 50 mL/hr at 04/09/17 0606   PRN Meds:.acetaminophen **OR** acetaminophen, ALPRAZolam, hydrALAZINE, ipratropium-albuterol, ondansetron **OR** ondansetron (ZOFRAN) IV, tiotropium, traMADol, traZODone, zolpidem  Micro Results No results found for this or any previous visit (from the past 240 hour(s)).  Radiology Reports Dg Chest 2 View  Result Date: 04/03/2017 CLINICAL DATA:  Shortness of breath. EXAM: CHEST  2 VIEW COMPARISON:  Radiographs of March 30, 2017. FINDINGS: The heart size and mediastinal contours are within normal limits. Both lungs are clear. Status post coronary artery bypass graft. No pneumothorax or pleural effusion is noted. Large hiatal hernia is noted. The visualized skeletal structures are unremarkable. IMPRESSION: No active cardiopulmonary disease.  Large hiatal hernia. Electronically Signed   By: Marijo Conception, M.D.   On: 04/03/2017 11:11   Dg Chest 2 View  Result Date: 03/30/2017 CLINICAL DATA:  Shortness of breath EXAM: CHEST  2 VIEW COMPARISON:  02/22/2017 FINDINGS: Previous median sternotomy and CABG procedure. Normal heart size. No pleural effusion or edema identified. No airspace  consolidation identified. IMPRESSION: 1. No acute cardiopulmonary abnormalities. 2. Hiatal hernia. Electronically Signed   By: Kerby Moors M.D.   On: 03/30/2017 13:58   Dg Abd 1 View  Result Date: 04/03/2017 CLINICAL DATA:  Intermittent left lower quadrant abdominal pain. EXAM: ABDOMEN - 1 VIEW COMPARISON:  CT abdomen pelvis 04/22/2015. CT abdomen and pelvis 12/29/2014, 05/19/2014 and earlier. FINDINGS: Bowel gas pattern unremarkable without evidence of obstruction or significant ileus. Moderate stool burden in the colon. No visible opaque urinary tract calculi. Prior aorto iliac stent grafting and coil embolization of the right internal iliac artery. Surgical suture material in the pelvis. IMPRESSION: No acute abdominal abnormality. Electronically Signed   By: Evangeline Dakin M.D.   On: 04/03/2017 18:13     Snigdha Howser M.D on 04/09/2017 at 1:59 PM  Between 7am to 7pm - Pager - [phone]  After 7pm go to [email or web address] - password [email or web address]  [facility] -  Office  [phone]

## 2017-04-09 NOTE — H&P (View-Only) (Signed)
Capsule endoscopy study in progress. Soonest CE reading would be 6/23, could be 6/24. Once results available will notify medical team and pt.    Problem list:  *  Acute on Chronic GI bleeding and associated GI blood loss anemia.  Innumerable endoscopic studies over many years.  S/p PRBC x 3, Hgb 8.2 >> 9.0.    EGD 6/21: Large HH. Mild non-bleeding erosive gastropathy associated with the hiatal hernia.  Small non-bleeding, duodenal angiodysplastic lesion, treated with APC.  Non-bleeding duodenal diverticulum.  Submucosal nodule in the duodenum. Capsule endoscopy dropped this morning, study will finish tonite.     Azucena Freed  PA-C

## 2017-04-09 NOTE — Op Note (Addendum)
[facility] Patient Name: [name] Procedure Date : 04/09/2017 MRN: [id number] Attending MD: Ladene Artist , MD Date of Birth: [birth year] CSN: [id number] Age: 76 Admit Type: Inpatient Procedure:                Colonoscopy Indications:              Melena, Iron deficiency anemia, AVMs noted in colon                            on capsule endoscopy likely in cecum/ascending colon Providers:                Pricilla Riffle. Fuller Plan, MD, Kingsley Plan, RN, Nevin Bloodgood, Technician Referring MD:             [facility] Medicines:                Monitored Anesthesia Care, Fentanyl 50 micrograms                            IV, Midazolam 5 mg IV Complications:            No immediate complications. Estimated blood loss:                            None. Estimated Blood Loss:     Estimated blood loss: minimal. Procedure:                Pre-Anesthesia Assessment:                           - Prior to the procedure, a History and Physical                            was performed, and patient medications and                            allergies were reviewed. The patient's tolerance of                            previous anesthesia was also reviewed. The risks                            and benefits of the procedure and the sedation                            options and risks were discussed with the patient.                            All questions were answered, and informed consent                            was obtained. Prior Anticoagulants: The patient has  taken no previous anticoagulant or antiplatelet                            agents. ASA Grade Assessment: III - A patient with                            severe systemic disease. After reviewing the risks                            and benefits, the patient was deemed in                            satisfactory condition to undergo the procedure.       After obtaining informed consent, the colonoscope                            was passed under direct vision. Throughout the                            procedure, the patient's blood pressure, pulse, and                            oxygen saturations were monitored continuously. The                            EC-[id number] ([id number]) scope was introduced through                            the anus and advanced to the the cecum, identified                            by appendiceal orifice and ileocecal valve. The                            ileocecal valve, appendiceal orifice, and rectum                            were photographed. The quality of the bowel                            preparation was adequate after extensive lavage and                            suctioning. The colonoscopy was performed without                            difficulty. The patient tolerated the procedure                            well. Scope In: 11:21:47 AM Scope Out: 11:49:35 AM Scope Withdrawal Time: 0 hours 25 minutes 7 seconds  Total Procedure Duration: 0 hours 27 minutes 48 seconds  Findings:      The perianal and digital rectal examinations were normal.  A 8 mm polyp was found in the sigmoid colon. The polyp was sessile. The       polyp was removed with a hot snare. Resection and retrieval were       complete.      A 6 mm polyp was found in the descending colon. The polyp was sessile.       The polyp was removed with a cold snare. Resection and retrieval were       complete.      Three medium-sized localized angiodysplastic lesions with one bleeding       on contact were found in the cecum. The other two were not bleeding.       Fulguration to ablate the lesion to stop and to prevent bleeding by       argon plasma was successful.      Scattered small-mouthed diverticula were found in the sigmoid colon,       descending colon and transverse colon. There was no evidence of       diverticular  bleeding.      The exam was otherwise without abnormality on direct and retroflexion       views. Impression:               - One 8 mm polyp in the sigmoid colon, removed with                            a hot snare. Resected and retrieved.                           - One 6 mm polyp in the descending colon, removed                            with a cold snare. Resected and retrieved.                           - Three colonic angiodysplastic lesions in the                            cecum. Treated with argon plasma coagulation (APC).                           - Mild diverticulosis in the sigmoid colon, in the                            descending colon and in the transverse colon. There                            was no evidence of diverticular bleeding.                           - The examination was otherwise normal on direct                            and retroflexion views. Moderate Sedation:      Moderate (conscious) sedation was administered by the endoscopy nurse       and supervised by the endoscopist. The following parameters were  monitored: oxygen saturation, heart rate, blood pressure, respiratory       rate, EKG, adequacy of pulmonary ventilation, and response to care.       Total physician intraservice time was 37 minutes. Recommendation:           - Patient has a contact number available for                            emergencies. The signs and symptoms of potential                            delayed complications were discussed with the                            patient. Return to normal activities tomorrow.                            Written discharge instructions were provided to the                            patient.                           - Resume previous diet.                           - Continue present medications.                           - Await pathology results.                           - No aspirin, ibuprofen, naproxen, or other                             non-steroidal anti-inflammatory drugs for 2 weeks                            after polyp removal.                           - No repeat colonoscopy due to age.                           - If he is stable overnight OK for discharge                            tomorrow from GI standpoint.                           - Follow up with PCP for mgmt of iron deficiency                            anemia, iron replacement. Expected that he will  continue to have chronic GI blood loss requiring                            long term monitoring of anemia and long term iron                            replacement Procedure Code(s):        --- Professional ---                           [phone], 59, Colonoscopy, flexible; with control of                            bleeding, any method                           45385, Colonoscopy, flexible; with removal of                            tumor(s), polyp(s), or other lesion(s) by snare                            technique                           99152, Moderate sedation services provided by the                            same physician or other qualified health care                            professional performing the diagnostic or                            therapeutic service that the sedation supports,                            requiring the presence of an independent trained                            observer to assist in the monitoring of the                            patient's level of consciousness and physiological                            status; initial 15 minutes of intraservice time,                            patient age 80 years or older                           [phone], Moderate sedation services; each additional  15 minutes intraservice time Diagnosis Code(s):        --- Professional ---                           D12.5, Benign neoplasm of sigmoid colon                           D12.4,  Benign neoplasm of descending colon                           K55.20, Angiodysplasia of colon without hemorrhage                           K92.1, Melena (includes Hematochezia)                           D50.9, Iron deficiency anemia, unspecified                           K57.30, Diverticulosis of large intestine without                            perforation or abscess without bleeding CPT copyright 2016 [employer]. All rights reserved. The codes documented in this report are preliminary and upon coder review may  be revised to meet current compliance requirements. Ladene Artist, MD 04/09/2017 12:02:24 PM This report has been signed electronically. Number of Addenda: 0

## 2017-04-09 NOTE — Interval H&P Note (Signed)
History and Physical Interval Note:  04/09/2017 11:11 AM  [name]  has presented today for surgery, with the diagnosis of GI bleed, anemia  The various methods of treatment have been discussed with the patient and family. After consideration of risks, benefits and other options for treatment, the patient has consented to  Procedure(s): COLONOSCOPY WITH PROPOFOL (N/A) as a surgical intervention .  The patient's history has been reviewed, patient examined, no change in status, stable for surgery.  I have reviewed the patient's chart and labs.  Questions were answered to the patient's satisfaction.     Pricilla Riffle. Fuller Plan

## 2017-04-10 LAB — CBC
HCT: 32.2 % — ABNORMAL LOW (ref 39.0–52.0)
Hemoglobin: 9.5 g/dL — ABNORMAL LOW (ref 13.0–17.0)
MCH: 27.1 pg (ref 26.0–34.0)
MCHC: 29.5 g/dL — ABNORMAL LOW (ref 30.0–36.0)
MCV: 91.7 fL (ref 78.0–100.0)
PLATELETS: 138 10*3/uL — AB (ref 150–400)
RBC: 3.51 MIL/uL — AB (ref 4.22–5.81)
RDW: 21.5 % — ABNORMAL HIGH (ref 11.5–15.5)
WBC: 8.9 10*3/uL (ref 4.0–10.5)

## 2017-04-10 LAB — GLUCOSE, CAPILLARY
GLUCOSE-CAPILLARY: 198 mg/dL — AB (ref 65–99)
Glucose-Capillary: 281 mg/dL — ABNORMAL HIGH (ref 65–99)

## 2017-04-10 MED ORDER — ZOLPIDEM TARTRATE 5 MG PO TABS: 5.0000 mg | ORAL_TABLET | Freq: Every evening | ORAL | 0 refills | Status: DC | PRN

## 2017-04-10 MED ORDER — FERROUS SULFATE 325 (65 FE) MG PO TABS: 325.0000 mg | ORAL_TABLET | Freq: Three times a day (TID) | ORAL | 1 refills | Status: DC

## 2017-04-10 MED ORDER — VITAMIN B-12 1000 MCG PO TABS: 1000.0000 ug | ORAL_TABLET | Freq: Every day | ORAL | 1 refills | Status: DC

## 2017-04-10 MED ORDER — INSULIN GLARGINE 100 UNIT/ML ~~LOC~~ SOLN: 25.0000 [IU] | Freq: Two times a day (BID) | SUBCUTANEOUS | 1 refills | Status: DC

## 2017-04-10 NOTE — Progress Notes (Signed)
Entered on wrong patient
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
## 2017-04-10 NOTE — Care Management Important Message (Signed)
Important Message  Patient Details  Name: MAXI RODAS MRN: [id number] Date of Birth: 1940-12-03   Medicare Important Message Given:  Yes    Nathen May 04/10/2017, 11:16 AM

## 2017-04-10 NOTE — Discharge Instructions (Signed)
Follow with Primary MD [name], MD in 7 days   Get CBC, CMP, 2 view Chest X ray checked  by Primary MD next visit.    Activity: As tolerated with Full fall precautions use walker/cane & assistance as needed   Disposition Home    Diet: Heart Healthy , carbohydrate modified , with feeding assistance and aspiration precautions.  For Heart failure patients - Check your Weight same time everyday, if you gain over 2 pounds, or you develop in leg swelling, experience more shortness of breath or chest pain, call your Primary MD immediately. Follow Cardiac Low Salt Diet and 1.5 lit/day fluid restriction.   On your next visit with your primary care physician please Get Medicines reviewed and adjusted.   Please request your Prim.MD to go over all Hospital Tests and Procedure/Radiological results at the follow up, please get all Hospital records sent to your Prim MD by signing hospital release before you go home.   If you experience worsening of your admission symptoms, develop shortness of breath, life threatening emergency, suicidal or homicidal thoughts you must seek medical attention immediately by calling 911 or calling your MD immediately  if symptoms less severe.  You Must read complete instructions/literature along with all the possible adverse reactions/side effects for all the Medicines you take and that have been prescribed to you. Take any new Medicines after you have completely understood and accpet all the possible adverse reactions/side effects.   Do not drive, operating heavy machinery, perform activities at heights, swimming or participation in water activities or provide baby sitting services if your were admitted for syncope or siezures until you have seen by Primary MD or a Neurologist and advised to do so again.  Do not drive when taking Pain medications.    Do not take more than prescribed Pain, Sleep and Anxiety Medications  Special Instructions: If you have smoked  or chewed Tobacco  in the last 2 yrs please stop smoking, stop any regular Alcohol  and or any Recreational drug use.  Wear Seat belts while driving.   Please note  You were cared for by a hospitalist during your hospital stay. If you have any questions about your discharge medications or the care you received while you were in the hospital after you are discharged, you can call the unit and asked to speak with the hospitalist on call if the hospitalist that took care of you is not available. Once you are discharged, your primary care physician will handle any further medical issues. Please note that NO REFILLS for any discharge medications will be authorized once you are discharged, as it is imperative that you return to your primary care physician (or establish a relationship with a primary care physician if you do not have one) for your aftercare needs so that they can reassess your need for medications and monitor your lab values.

## 2017-04-10 NOTE — Progress Notes (Signed)
Physical Therapy Treatment Patient Details Name: [name] MRN: [id number] DOB: 12-20-1940 Today's Date: 04/10/2017    History of Present Illness Pt is a 76 y/o male admitted secondary to GI bleed. 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    PT Comments    Patient progressing towards PT goals, increased ambulation (with standing rest breaks) and performed stair negotiation supervision. Some instability noted. Activity performed on 4 liters [place].    Follow Up Recommendations  Home health PT;Supervision for mobility/OOB     Equipment Recommendations  None recommended by PT    Recommendations for Other Services       Precautions / Restrictions Precautions Precautions: Fall Restrictions Weight Bearing Restrictions: No    Mobility  Bed Mobility Overal bed mobility: Modified Independent             General bed mobility comments: with use of bed rail  Transfers Overall transfer level: Needs assistance Equipment used: Rolling walker (2 wheeled) Transfers: Sit to/from Stand Sit to Stand: Min guard         General transfer comment: increased time, good technique, min guard for safety  Ambulation/Gait Ambulation/Gait assistance: Min guard Ambulation Distance (Feet): 480 Feet Assistive device: Rolling walker (2 wheeled) Gait Pattern/deviations: Decreased step length - right;Decreased step length - left;Decreased stride length;Step-through pattern Gait velocity: decreased Gait velocity interpretation: Below normal speed for age/gender General Gait Details: 3 standing rest breaks, ambulated on 4 liters   Stairs Stairs: Yes   Stair Management: Two rails Number of Stairs: 3 General stair comments: no difficulty with stair performance  Wheelchair Mobility    Modified Rankin (Stroke Patients Only)       Balance Overall balance assessment: History of Falls;Needs  assistance Sitting-balance support: Feet supported Sitting balance-Leahy Scale: Good     Standing balance support: During functional activity;Bilateral upper extremity supported Standing balance-Leahy Scale: Poor Standing balance comment: Ues of RW for Bilateral UE support, able to stand without support for breif periods                            Cognition Arousal/Alertness: Awake/alert Behavior During Therapy: WFL for tasks assessed/performed Overall Cognitive Status: Within Functional Limits for tasks assessed                                        Exercises      General Comments General comments (skin integrity, edema, etc.): educated on energy conservation techniques      Pertinent Vitals/Pain Pain Assessment: Faces Faces Pain Scale: Hurts little more Pain Location: low back Pain Descriptors / Indicators: Aching Pain Intervention(s): Monitored during session    Home Living                      Prior Function            PT Goals (current goals can now be found in the care plan section) Acute Rehab PT Goals Patient Stated Goal: to be able to walk the length of his driveway and to be able to go out to dinner with his wife PT Goal Formulation: With patient Time For Goal Achievement: 04/19/17 Potential to Achieve Goals: Good Progress towards PT goals: Progressing toward goals    Frequency    Min 3X/week  PT Plan Current plan remains appropriate    Co-evaluation              AM-PAC PT "6 Clicks" Daily Activity  Outcome Measure  Difficulty turning over in bed (including adjusting bedclothes, sheets and blankets)?: None Difficulty moving from lying on back to sitting on the side of the bed? : None Difficulty sitting down on and standing up from a chair with arms (e.g., wheelchair, bedside commode, etc,.)?: Total Help needed moving to and from a bed to chair (including a wheelchair)?: A Little Help needed  walking in hospital room?: A Little Help needed climbing 3-5 steps with a railing? : A Little 6 Click Score: 18    End of Session Equipment Utilized During Treatment: Gait belt;Oxygen (4L of O2) Activity Tolerance: Patient tolerated treatment well Patient left: in bed;with call bell/phone within reach;Other (comment) (sitting EOB) Nurse Communication: Mobility status PT Visit Diagnosis: Repeated falls (R29.6);Other abnormalities of gait and mobility (R26.89)     Time: 9758-8325 PT Time Calculation (min) (ACUTE ONLY): 18 min  Charges:  $Gait Training: 8-22 mins                    G Codes:       Alben Deeds, PT DPT  [phone]    Duncan Dull 04/10/2017, 12:55 PM

## 2017-04-11 ENCOUNTER — Encounter: Payer: Self-pay | Admitting: Gastroenterology

## 2017-04-17 NOTE — Progress Notes (Signed)
G-Codes for PT evaluation    03/31/17 1356  PT Time Calculation  PT Start Time (ACUTE ONLY) 1256  PT Stop Time (ACUTE ONLY) 1325  PT Time Calculation (min) (ACUTE ONLY) 29 min  PT G-Codes **NOT FOR INPATIENT CLASS**  Functional Assessment Tool Used AM-PAC 6 Clicks Basic Mobility;Clinical judgement  Functional Limitation Mobility: Walking and moving around  Mobility: Walking and Moving Around Current Status (Z2248) CJ  Mobility: Walking and Moving Around Goal Status (G5003) CI  PT General Charges  $$ ACUTE PT VISIT 1 Procedure  PT Evaluation  $PT Eval Moderate Complexity 1 Procedure  PT Treatments  $Gait Training 8-22 mins   Carmelia Bake, PT, DPT 04/17/2017 Pager: [phone]

## 2017-05-08 ENCOUNTER — Inpatient Hospital Stay (HOSPITAL_COMMUNITY)
Admission: EM | Admit: 2017-05-08 | Discharge: 2017-05-11 | DRG: 378 | Disposition: A | Discharge status: Home / Self Care | Payer: Medicare Other | Attending: Family Medicine | Admitting: Family Medicine

## 2017-05-08 ENCOUNTER — Encounter (HOSPITAL_COMMUNITY): Payer: Self-pay | Admitting: Emergency Medicine

## 2017-05-08 ENCOUNTER — Emergency Department (HOSPITAL_COMMUNITY): Payer: Medicare Other

## 2017-05-08 DIAGNOSIS — E875 Hyperkalemia: Secondary | ICD-10-CM | POA: Diagnosis present

## 2017-05-08 DIAGNOSIS — I5032 Chronic diastolic (congestive) heart failure: Secondary | ICD-10-CM | POA: Diagnosis present

## 2017-05-08 DIAGNOSIS — K31811 Angiodysplasia of stomach and duodenum with bleeding: Secondary | ICD-10-CM | POA: Diagnosis present

## 2017-05-08 DIAGNOSIS — Z86010 Personal history of colonic polyps: Secondary | ICD-10-CM

## 2017-05-08 DIAGNOSIS — E119 Type 2 diabetes mellitus without complications: Secondary | ICD-10-CM

## 2017-05-08 DIAGNOSIS — K5520 Angiodysplasia of colon without hemorrhage: Secondary | ICD-10-CM

## 2017-05-08 DIAGNOSIS — J449 Chronic obstructive pulmonary disease, unspecified: Secondary | ICD-10-CM | POA: Diagnosis present

## 2017-05-08 DIAGNOSIS — K5521 Angiodysplasia of colon with hemorrhage: Secondary | ICD-10-CM | POA: Diagnosis present

## 2017-05-08 DIAGNOSIS — I2510 Atherosclerotic heart disease of native coronary artery without angina pectoris: Secondary | ICD-10-CM | POA: Diagnosis not present

## 2017-05-08 DIAGNOSIS — E1140 Type 2 diabetes mellitus with diabetic neuropathy, unspecified: Secondary | ICD-10-CM | POA: Diagnosis present

## 2017-05-08 DIAGNOSIS — Q2733 Arteriovenous malformation of digestive system vessel: Secondary | ICD-10-CM

## 2017-05-08 DIAGNOSIS — K219 Gastro-esophageal reflux disease without esophagitis: Secondary | ICD-10-CM | POA: Diagnosis present

## 2017-05-08 DIAGNOSIS — R0600 Dyspnea, unspecified: Secondary | ICD-10-CM

## 2017-05-08 DIAGNOSIS — Z885 Allergy status to narcotic agent status: Secondary | ICD-10-CM

## 2017-05-08 DIAGNOSIS — N179 Acute kidney failure, unspecified: Secondary | ICD-10-CM

## 2017-05-08 DIAGNOSIS — Z794 Long term (current) use of insulin: Secondary | ICD-10-CM

## 2017-05-08 DIAGNOSIS — E118 Type 2 diabetes mellitus with unspecified complications: Secondary | ICD-10-CM

## 2017-05-08 DIAGNOSIS — Z6832 Body mass index (BMI) 32.0-32.9, adult: Secondary | ICD-10-CM

## 2017-05-08 DIAGNOSIS — I130 Hypertensive heart and chronic kidney disease with heart failure and stage 1 through stage 4 chronic kidney disease, or unspecified chronic kidney disease: Secondary | ICD-10-CM | POA: Diagnosis present

## 2017-05-08 DIAGNOSIS — Z888 Allergy status to other drugs, medicaments and biological substances status: Secondary | ICD-10-CM

## 2017-05-08 DIAGNOSIS — E785 Hyperlipidemia, unspecified: Secondary | ICD-10-CM | POA: Diagnosis not present

## 2017-05-08 DIAGNOSIS — E872 Acidosis: Secondary | ICD-10-CM | POA: Diagnosis not present

## 2017-05-08 DIAGNOSIS — F3162 Bipolar disorder, current episode mixed, moderate: Secondary | ICD-10-CM | POA: Diagnosis present

## 2017-05-08 DIAGNOSIS — K922 Gastrointestinal hemorrhage, unspecified: Secondary | ICD-10-CM | POA: Diagnosis not present

## 2017-05-08 DIAGNOSIS — I959 Hypotension, unspecified: Secondary | ICD-10-CM | POA: Diagnosis present

## 2017-05-08 DIAGNOSIS — J439 Emphysema, unspecified: Secondary | ICD-10-CM

## 2017-05-08 DIAGNOSIS — N183 Chronic kidney disease, stage 3 (moderate): Secondary | ICD-10-CM | POA: Diagnosis not present

## 2017-05-08 DIAGNOSIS — I252 Old myocardial infarction: Secondary | ICD-10-CM

## 2017-05-08 DIAGNOSIS — E669 Obesity, unspecified: Secondary | ICD-10-CM | POA: Diagnosis not present

## 2017-05-08 DIAGNOSIS — E860 Dehydration: Secondary | ICD-10-CM | POA: Diagnosis present

## 2017-05-08 DIAGNOSIS — K5730 Diverticulosis of large intestine without perforation or abscess without bleeding: Secondary | ICD-10-CM | POA: Diagnosis present

## 2017-05-08 DIAGNOSIS — Z8249 Family history of ischemic heart disease and other diseases of the circulatory system: Secondary | ICD-10-CM

## 2017-05-08 DIAGNOSIS — Z881 Allergy status to other antibiotic agents status: Secondary | ICD-10-CM

## 2017-05-08 DIAGNOSIS — Z880 Allergy status to penicillin: Secondary | ICD-10-CM

## 2017-05-08 DIAGNOSIS — Z833 Family history of diabetes mellitus: Secondary | ICD-10-CM

## 2017-05-08 DIAGNOSIS — Z951 Presence of aortocoronary bypass graft: Secondary | ICD-10-CM

## 2017-05-08 DIAGNOSIS — E1121 Type 2 diabetes mellitus with diabetic nephropathy: Secondary | ICD-10-CM | POA: Diagnosis present

## 2017-05-08 DIAGNOSIS — K31819 Angiodysplasia of stomach and duodenum without bleeding: Secondary | ICD-10-CM

## 2017-05-08 DIAGNOSIS — K5710 Diverticulosis of small intestine without perforation or abscess without bleeding: Secondary | ICD-10-CM | POA: Diagnosis present

## 2017-05-08 DIAGNOSIS — G8929 Other chronic pain: Secondary | ICD-10-CM | POA: Diagnosis present

## 2017-05-08 DIAGNOSIS — E1122 Type 2 diabetes mellitus with diabetic chronic kidney disease: Secondary | ICD-10-CM | POA: Diagnosis present

## 2017-05-08 DIAGNOSIS — J9611 Chronic respiratory failure with hypoxia: Secondary | ICD-10-CM | POA: Diagnosis present

## 2017-05-08 DIAGNOSIS — D62 Acute posthemorrhagic anemia: Secondary | ICD-10-CM | POA: Diagnosis not present

## 2017-05-08 DIAGNOSIS — G4700 Insomnia, unspecified: Secondary | ICD-10-CM | POA: Diagnosis present

## 2017-05-08 DIAGNOSIS — Z9981 Dependence on supplemental oxygen: Secondary | ICD-10-CM

## 2017-05-08 DIAGNOSIS — K449 Diaphragmatic hernia without obstruction or gangrene: Secondary | ICD-10-CM | POA: Diagnosis present

## 2017-05-08 DIAGNOSIS — D649 Anemia, unspecified: Secondary | ICD-10-CM

## 2017-05-08 LAB — CBC WITH DIFFERENTIAL/PLATELET
BASOS PCT: 0 %
Basophils Absolute: 0 10*3/uL (ref 0.0–0.1)
Eosinophils Absolute: 0.2 10*3/uL (ref 0.0–0.7)
Eosinophils Relative: 1 %
HCT: 29.3 % — ABNORMAL LOW (ref 39.0–52.0)
HEMOGLOBIN: 8.7 g/dL — AB (ref 13.0–17.0)
Lymphocytes Relative: 15 %
Lymphs Abs: 2.1 10*3/uL (ref 0.7–4.0)
MCH: 28.5 pg (ref 26.0–34.0)
MCHC: 29.7 g/dL — AB (ref 30.0–36.0)
MCV: 96.1 fL (ref 78.0–100.0)
MONO ABS: 1.1 10*3/uL — AB (ref 0.1–1.0)
Monocytes Relative: 7 %
NEUTROS ABS: 11.1 10*3/uL — AB (ref 1.7–7.7)
Neutrophils Relative %: 77 %
Platelets: 227 10*3/uL (ref 150–400)
RBC: 3.05 MIL/uL — ABNORMAL LOW (ref 4.22–5.81)
RDW: 19.6 % — ABNORMAL HIGH (ref 11.5–15.5)
WBC: 14.5 10*3/uL — AB (ref 4.0–10.5)

## 2017-05-08 LAB — COMPREHENSIVE METABOLIC PANEL
ALK PHOS: 112 U/L (ref 38–126)
ALT: 12 U/L — AB (ref 17–63)
ANION GAP: 9 (ref 5–15)
AST: 17 U/L (ref 15–41)
Albumin: 3.1 g/dL — ABNORMAL LOW (ref 3.5–5.0)
BILIRUBIN TOTAL: 0.5 mg/dL (ref 0.3–1.2)
BUN: 36 mg/dL — ABNORMAL HIGH (ref 6–20)
CALCIUM: 8.7 mg/dL — AB (ref 8.9–10.3)
CO2: 32 mmol/L (ref 22–32)
CREATININE: 1.4 mg/dL — AB (ref 0.61–1.24)
Chloride: 96 mmol/L — ABNORMAL LOW (ref 101–111)
GFR, EST AFRICAN AMERICAN: 55 mL/min — AB (ref >60)
GFR, EST NON AFRICAN AMERICAN: 48 mL/min — AB (ref >60)
Glucose, Bld: 263 mg/dL — ABNORMAL HIGH (ref 65–99)
Potassium: 5.3 mmol/L — ABNORMAL HIGH (ref 3.5–5.1)
Sodium: 137 mmol/L (ref 135–145)
TOTAL PROTEIN: 5.7 g/dL — AB (ref 6.5–8.1)

## 2017-05-08 LAB — URINALYSIS, ROUTINE W REFLEX MICROSCOPIC
BILIRUBIN URINE: NEGATIVE
Glucose, UA: NEGATIVE mg/dL
HGB URINE DIPSTICK: NEGATIVE
KETONES UR: NEGATIVE mg/dL
Leukocytes, UA: NEGATIVE
NITRITE: NEGATIVE
PROTEIN: NEGATIVE mg/dL
Specific Gravity, Urine: 1.015 (ref 1.005–1.030)
pH: 5 (ref 5.0–8.0)

## 2017-05-08 LAB — CBC
HEMATOCRIT: 24.9 % — AB (ref 39.0–52.0)
HEMOGLOBIN: 7.6 g/dL — AB (ref 13.0–17.0)
MCH: 29 pg (ref 26.0–34.0)
MCHC: 30.5 g/dL (ref 30.0–36.0)
MCV: 95 fL (ref 78.0–100.0)
Platelets: 127 10*3/uL — ABNORMAL LOW (ref 150–400)
RBC: 2.62 MIL/uL — ABNORMAL LOW (ref 4.22–5.81)
RDW: 19.6 % — ABNORMAL HIGH (ref 11.5–15.5)
WBC: 8.4 10*3/uL (ref 4.0–10.5)

## 2017-05-08 LAB — LIPASE, BLOOD: LIPASE: 39 U/L (ref 11–51)

## 2017-05-08 LAB — LACTIC ACID, PLASMA
LACTIC ACID, VENOUS: 2.5 mmol/L — AB (ref 0.5–1.9)
Lactic Acid, Venous: 2.4 mmol/L (ref 0.5–1.9)
Lactic Acid, Venous: 2.2 mmol/L (ref 0.5–1.9)

## 2017-05-08 LAB — GLUCOSE, CAPILLARY
GLUCOSE-CAPILLARY: 194 mg/dL — AB (ref 65–99)
Glucose-Capillary: 205 mg/dL — ABNORMAL HIGH (ref 65–99)

## 2017-05-08 LAB — I-STAT TROPONIN, ED: TROPONIN I, POC: 0.02 ng/mL (ref 0.00–0.08)

## 2017-05-08 LAB — PREPARE RBC (CROSSMATCH)

## 2017-05-08 LAB — POC OCCULT BLOOD, ED: FECAL OCCULT BLD: POSITIVE — AB

## 2017-05-08 MED ORDER — TRAMADOL HCL 50 MG PO TABS
50.0000 mg | ORAL_TABLET | Freq: Two times a day (BID) | ORAL | Status: DC | PRN
  Administered 2017-05-11 (×2): 50 mg via ORAL
  Filled 2017-05-08 (×2): qty 1

## 2017-05-08 MED ORDER — BOOST / RESOURCE BREEZE PO LIQD
1.0000 | Freq: Three times a day (TID) | ORAL | Status: DC
  Administered 2017-05-08 – 2017-05-09 (×2): 1 via ORAL

## 2017-05-08 MED ORDER — INSULIN ASPART 100 UNIT/ML ~~LOC~~ SOLN: 0.0000 [IU] | Freq: Every day | SUBCUTANEOUS | Status: DC

## 2017-05-08 MED ORDER — INSULIN GLARGINE 100 UNIT/ML ~~LOC~~ SOLN
25.0000 [IU] | Freq: Two times a day (BID) | SUBCUTANEOUS | Status: DC
  Administered 2017-05-08 – 2017-05-11 (×6): 25 [IU] via SUBCUTANEOUS
  Filled 2017-05-08 (×7): qty 0.25

## 2017-05-08 MED ORDER — MOMETASONE FURO-FORMOTEROL FUM 200-5 MCG/ACT IN AERO
2.0000 | INHALATION_SPRAY | Freq: Two times a day (BID) | RESPIRATORY_TRACT | Status: DC
  Administered 2017-05-08 – 2017-05-11 (×6): 2 via RESPIRATORY_TRACT
  Filled 2017-05-08: qty 8.8

## 2017-05-08 MED ORDER — ALPRAZOLAM 0.5 MG PO TABS
0.5000 mg | ORAL_TABLET | Freq: Three times a day (TID) | ORAL | Status: DC | PRN
  Administered 2017-05-08 – 2017-05-10 (×4): 0.5 mg via ORAL
  Filled 2017-05-08 (×4): qty 1

## 2017-05-08 MED ORDER — IOPAMIDOL (ISOVUE-370) INJECTION 76%
INTRAVENOUS | Status: AC
  Administered 2017-05-08: 100 mL
  Filled 2017-05-08: qty 100

## 2017-05-08 MED ORDER — ACETAMINOPHEN 650 MG RE SUPP: 650.0000 mg | Freq: Four times a day (QID) | RECTAL | Status: DC | PRN

## 2017-05-08 MED ORDER — ACETAMINOPHEN 325 MG PO TABS: 650.0000 mg | ORAL_TABLET | Freq: Four times a day (QID) | ORAL | Status: DC | PRN

## 2017-05-08 MED ORDER — SODIUM CHLORIDE 0.9 % IV BOLUS (SEPSIS)
1000.0000 mL | Freq: Once | INTRAVENOUS | Status: AC
  Administered 2017-05-08: 1000 mL via INTRAVENOUS

## 2017-05-08 MED ORDER — CITALOPRAM HYDROBROMIDE 40 MG PO TABS
40.0000 mg | ORAL_TABLET | Freq: Every day | ORAL | Status: DC
  Administered 2017-05-08 – 2017-05-09 (×2): 40 mg via ORAL
  Filled 2017-05-08 (×2): qty 1

## 2017-05-08 MED ORDER — ONDANSETRON HCL 4 MG/2ML IJ SOLN: 4.0000 mg | Freq: Four times a day (QID) | INTRAMUSCULAR | Status: DC | PRN

## 2017-05-08 MED ORDER — DEXTROSE 5 % IV SOLN: 2.0000 g | Freq: Once | INTRAVENOUS | Status: DC

## 2017-05-08 MED ORDER — ATORVASTATIN CALCIUM 40 MG PO TABS
40.0000 mg | ORAL_TABLET | Freq: Every day | ORAL | Status: DC
  Administered 2017-05-08 – 2017-05-10 (×3): 40 mg via ORAL
  Filled 2017-05-08 (×3): qty 1

## 2017-05-08 MED ORDER — PANTOPRAZOLE SODIUM 40 MG IV SOLR
40.0000 mg | Freq: Two times a day (BID) | INTRAVENOUS | Status: DC
  Administered 2017-05-08 – 2017-05-11 (×6): 40 mg via INTRAVENOUS
  Filled 2017-05-08 (×7): qty 40

## 2017-05-08 MED ORDER — ALBUTEROL SULFATE (2.5 MG/3ML) 0.083% IN NEBU: 3.0000 mL | INHALATION_SOLUTION | Freq: Four times a day (QID) | RESPIRATORY_TRACT | Status: DC | PRN

## 2017-05-08 MED ORDER — SODIUM CHLORIDE 0.9 % IV SOLN
INTRAVENOUS | Status: DC
  Administered 2017-05-08: 15:00:00 via INTRAVENOUS
  Administered 2017-05-09: 1000 mL via INTRAVENOUS
  Administered 2017-05-10: 05:00:00 via INTRAVENOUS

## 2017-05-08 MED ORDER — TRAZODONE HCL 50 MG PO TABS
50.0000 mg | ORAL_TABLET | Freq: Every evening | ORAL | Status: DC | PRN
  Filled 2017-05-08: qty 1

## 2017-05-08 MED ORDER — IOPAMIDOL (ISOVUE-300) INJECTION 61%
INTRAVENOUS | Status: AC
  Filled 2017-05-08: qty 100

## 2017-05-08 MED ORDER — GABAPENTIN 300 MG PO CAPS
300.0000 mg | ORAL_CAPSULE | Freq: Three times a day (TID) | ORAL | Status: DC
  Administered 2017-05-08 – 2017-05-11 (×9): 300 mg via ORAL
  Filled 2017-05-08 (×9): qty 1

## 2017-05-08 MED ORDER — TIOTROPIUM BROMIDE MONOHYDRATE 18 MCG IN CAPS
18.0000 ug | ORAL_CAPSULE | Freq: Every day | RESPIRATORY_TRACT | Status: DC | PRN
  Filled 2017-05-08: qty 5

## 2017-05-08 MED ORDER — ONDANSETRON HCL 4 MG/2ML IJ SOLN
4.0000 mg | Freq: Once | INTRAMUSCULAR | Status: AC
  Administered 2017-05-08: 4 mg via INTRAVENOUS
  Filled 2017-05-08: qty 2

## 2017-05-08 MED ORDER — ONDANSETRON HCL 4 MG PO TABS: 4.0000 mg | ORAL_TABLET | Freq: Four times a day (QID) | ORAL | Status: DC | PRN

## 2017-05-08 MED ORDER — SODIUM CHLORIDE 0.9 % IV SOLN
Freq: Once | INTRAVENOUS | Status: AC
  Administered 2017-05-08: 17:00:00 via INTRAVENOUS

## 2017-05-08 MED ORDER — INSULIN ASPART 100 UNIT/ML ~~LOC~~ SOLN
0.0000 [IU] | Freq: Three times a day (TID) | SUBCUTANEOUS | Status: DC
  Administered 2017-05-08: 3 [IU] via SUBCUTANEOUS
  Administered 2017-05-09: 2 [IU] via SUBCUTANEOUS
  Administered 2017-05-09: 3 [IU] via SUBCUTANEOUS
  Administered 2017-05-09: 2 [IU] via SUBCUTANEOUS
  Administered 2017-05-10 (×2): 1 [IU] via SUBCUTANEOUS
  Administered 2017-05-11: 3 [IU] via SUBCUTANEOUS

## 2017-05-08 NOTE — H&P (Signed)
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 showing nonbleeding erosive gastropathy, angiodysplastic nonbleeding lesion in the duodenum; capsular study showing few cecal and right colonic AVMs; colonoscopy on 624 showing polyps, mild diverticulosis with no evidence of active bleeding and 3 colonic angiodysplastic lesions which were treated with argon plasma coagulation. Patient reporting dark non-tarry stools, fatigue and is FOBT positive in the ED. Of note pt is on iron supplementation but this is chronic and independent of subjective reports of dark stool. - GI consult if hemoglobin continues to trend downward during admission. - Protonix  Anemia: Hgb 8.5. Baseline 9.7. Likely from acute GI bleed. Previous admission requriing multiple round of infusions.  - CBC Q12  AKI: Cr 1.4. Baseline 1.0.  - IVF - BMTP in am  HyperK: 5.3 - IVF  Abd pain: CT ABD/Plv w/o acute abnormality. Viral gastro vs GI bleed. Zofran w/ improvement.  - IVF - Monitor - Lactic acid - KUB in am if still painful.   Hypotension: likely combination of hemodynamic  shifts from bleed and dehydration. 1L NS bolus in ED. No evidence of infectious process.  - Repeat 1L bolus - IVF - hold home BP meds.   DM: - continue lantus - SSI  COPD: chronic/advanced. Per review of Dr. Anastasia Pall last office visit note there is concern for possible advancing fibrotic component to his condition. O2 demand at baseline 7L - continue O2 - continue spiriva, dulera,   Chronic pain: - continue tramadol, neurontin  Insomnia: - contineu trazodone  CHF: Chronic diastolic failure with  grade 1 diastolic dysfunction noted on echo. Last EF 55%. No evidence of acute composition. - continue damedex when pressure improves.   Depression/ANxieyt: - cotnineu celexa, xanax  HLD; - continue Statin     DVT prophylaxis: SCD  Code Status: full  Family Communication: none  Disposition Plan: pending improvement in condition  Consults called: none  Admission status: observation     MERRELL, DAVID J MD [facility]  If 7PM-7AM, please contact night-coverage [email or web address] Password [email or web address]  05/08/2017, 1:49 PM

## 2017-05-08 NOTE — ED Notes (Signed)
Date and time results received: 05/08/17 2:08 PM  Test: Lactic Critical Value: 2.4  Name of Provider Notified: Marily Memos MD

## 2017-05-08 NOTE — ED Triage Notes (Signed)
Pt in from home via [facility] with c/o SOB and abdominal pain. Pt states generalized abd pain began 3 days ago, had dark stool yesterday, hx of GIB and was hospitalized 1 mo ago. SOB began last night, pt wears 7L at baseline, sats 98% on arrival. Pt denies vomitting, dizziness or cp. Alert, VSS, does not take thinners

## 2017-05-08 NOTE — Progress Notes (Signed)
CRITICAL VALUE ALERT  Critical Value: lactic acid 2.8 Date & Time Notied: 7/23 1801  Provider Notified: Dr. Marily Memos 1803 Orders Received/Actions taken:pt getting Blood MD okay with lactic

## 2017-05-08 NOTE — ED Notes (Signed)
Nurse starting ultrasound IV.
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
## 2017-05-08 NOTE — ED Provider Notes (Signed)
Patient was seen by Dr. Dina Rich this morning. Please see her note for full history and physical.  I was asked to follow-up on the patient following the CT scan. Patient was complaining of abdominal pain associated with his shortness of breath and rectal bleeding. CT scan of the chest although limited did not show any acute abnormalities. CT scan of the abdomen and pelvis was also without any acute abnormalities. She did complain of worsening shortness of breath and weakness. He has a history of COPD as well as chronic GI bleeding.  Patient's hemoglobin today is 8.7, down from 9.5. Patient is concerned because frequently his hemoglobin continues to drop and he requires transfusion. Patient does feel too weak to go home. I will consult the medical service for admission, serial hemoglobin   Dorie Rank, MD 05/08/17 1104

## 2017-05-09 DIAGNOSIS — K31811 Angiodysplasia of stomach and duodenum with bleeding: Secondary | ICD-10-CM | POA: Diagnosis not present

## 2017-05-09 DIAGNOSIS — K5521 Angiodysplasia of colon with hemorrhage: Secondary | ICD-10-CM | POA: Diagnosis present

## 2017-05-09 DIAGNOSIS — K5710 Diverticulosis of small intestine without perforation or abscess without bleeding: Secondary | ICD-10-CM | POA: Diagnosis not present

## 2017-05-09 DIAGNOSIS — K922 Gastrointestinal hemorrhage, unspecified: Secondary | ICD-10-CM | POA: Diagnosis not present

## 2017-05-09 DIAGNOSIS — J449 Chronic obstructive pulmonary disease, unspecified: Secondary | ICD-10-CM | POA: Diagnosis not present

## 2017-05-09 DIAGNOSIS — Q2733 Arteriovenous malformation of digestive system vessel: Secondary | ICD-10-CM | POA: Diagnosis not present

## 2017-05-09 DIAGNOSIS — I252 Old myocardial infarction: Secondary | ICD-10-CM | POA: Diagnosis not present

## 2017-05-09 DIAGNOSIS — K449 Diaphragmatic hernia without obstruction or gangrene: Secondary | ICD-10-CM | POA: Diagnosis not present

## 2017-05-09 DIAGNOSIS — Z6832 Body mass index (BMI) 32.0-32.9, adult: Secondary | ICD-10-CM | POA: Diagnosis not present

## 2017-05-09 DIAGNOSIS — R1084 Generalized abdominal pain: Secondary | ICD-10-CM

## 2017-05-09 DIAGNOSIS — E860 Dehydration: Secondary | ICD-10-CM | POA: Diagnosis not present

## 2017-05-09 DIAGNOSIS — D62 Acute posthemorrhagic anemia: Secondary | ICD-10-CM

## 2017-05-09 DIAGNOSIS — R710 Precipitous drop in hematocrit: Secondary | ICD-10-CM

## 2017-05-09 DIAGNOSIS — J9611 Chronic respiratory failure with hypoxia: Secondary | ICD-10-CM | POA: Diagnosis not present

## 2017-05-09 DIAGNOSIS — I2510 Atherosclerotic heart disease of native coronary artery without angina pectoris: Secondary | ICD-10-CM | POA: Diagnosis not present

## 2017-05-09 DIAGNOSIS — E785 Hyperlipidemia, unspecified: Secondary | ICD-10-CM | POA: Diagnosis not present

## 2017-05-09 DIAGNOSIS — E669 Obesity, unspecified: Secondary | ICD-10-CM | POA: Diagnosis not present

## 2017-05-09 DIAGNOSIS — F3162 Bipolar disorder, current episode mixed, moderate: Secondary | ICD-10-CM | POA: Diagnosis not present

## 2017-05-09 DIAGNOSIS — K3189 Other diseases of stomach and duodenum: Secondary | ICD-10-CM | POA: Diagnosis not present

## 2017-05-09 DIAGNOSIS — N179 Acute kidney failure, unspecified: Secondary | ICD-10-CM | POA: Diagnosis not present

## 2017-05-09 DIAGNOSIS — E875 Hyperkalemia: Secondary | ICD-10-CM | POA: Diagnosis not present

## 2017-05-09 DIAGNOSIS — K5730 Diverticulosis of large intestine without perforation or abscess without bleeding: Secondary | ICD-10-CM | POA: Diagnosis not present

## 2017-05-09 DIAGNOSIS — N183 Chronic kidney disease, stage 3 (moderate): Secondary | ICD-10-CM | POA: Diagnosis not present

## 2017-05-09 DIAGNOSIS — R195 Other fecal abnormalities: Secondary | ICD-10-CM

## 2017-05-09 DIAGNOSIS — I130 Hypertensive heart and chronic kidney disease with heart failure and stage 1 through stage 4 chronic kidney disease, or unspecified chronic kidney disease: Secondary | ICD-10-CM | POA: Diagnosis not present

## 2017-05-09 DIAGNOSIS — I5032 Chronic diastolic (congestive) heart failure: Secondary | ICD-10-CM | POA: Diagnosis not present

## 2017-05-09 DIAGNOSIS — E872 Acidosis: Secondary | ICD-10-CM | POA: Diagnosis not present

## 2017-05-09 LAB — BPAM RBC
BLOOD PRODUCT EXPIRATION DATE: 201808022359
Blood Product Expiration Date: 201807282359
ISSUE DATE / TIME: 201807231657
ISSUE DATE / TIME: 201807232338
Unit Type and Rh: 1700
Unit Type and Rh: 1700

## 2017-05-09 LAB — CBC
HEMATOCRIT: 29 % — AB (ref 39.0–52.0)
HEMOGLOBIN: 9 g/dL — AB (ref 13.0–17.0)
MCH: 28.6 pg (ref 26.0–34.0)
MCHC: 31 g/dL (ref 30.0–36.0)
MCV: 92.1 fL (ref 78.0–100.0)
Platelets: 145 10*3/uL — ABNORMAL LOW (ref 150–400)
RBC: 3.15 MIL/uL — AB (ref 4.22–5.81)
RDW: 21.1 % — ABNORMAL HIGH (ref 11.5–15.5)
WBC: 8.3 10*3/uL (ref 4.0–10.5)

## 2017-05-09 LAB — BASIC METABOLIC PANEL
Anion gap: 7 (ref 5–15)
BUN: 28 mg/dL — ABNORMAL HIGH (ref 6–20)
CHLORIDE: 100 mmol/L — AB (ref 101–111)
CO2: 30 mmol/L (ref 22–32)
CREATININE: 1.28 mg/dL — AB (ref 0.61–1.24)
Calcium: 8.4 mg/dL — ABNORMAL LOW (ref 8.9–10.3)
GFR calc non Af Amer: 53 mL/min — ABNORMAL LOW (ref >60)
Glucose, Bld: 149 mg/dL — ABNORMAL HIGH (ref 65–99)
POTASSIUM: 4.5 mmol/L (ref 3.5–5.1)
Sodium: 137 mmol/L (ref 135–145)

## 2017-05-09 LAB — TYPE AND SCREEN
ABO/RH(D): B NEG
Antibody Screen: NEGATIVE
UNIT DIVISION: 0
Unit division: 0

## 2017-05-09 LAB — GLUCOSE, CAPILLARY
GLUCOSE-CAPILLARY: 185 mg/dL — AB (ref 65–99)
GLUCOSE-CAPILLARY: 220 mg/dL — AB (ref 65–99)
GLUCOSE-CAPILLARY: 197 mg/dL — AB (ref 65–99)
Glucose-Capillary: 129 mg/dL — ABNORMAL HIGH (ref 65–99)

## 2017-05-09 LAB — LACTIC ACID, PLASMA: LACTIC ACID, VENOUS: 2.1 mmol/L — AB (ref 0.5–1.9)

## 2017-05-09 MED ORDER — CITALOPRAM HYDROBROMIDE 20 MG PO TABS
20.0000 mg | ORAL_TABLET | Freq: Every day | ORAL | Status: DC
  Administered 2017-05-11: 20 mg via ORAL
  Filled 2017-05-09: qty 1

## 2017-05-09 MED ORDER — SENNA 8.6 MG PO TABS
1.0000 | ORAL_TABLET | Freq: Every day | ORAL | Status: DC
  Administered 2017-05-11: 8.6 mg via ORAL
  Filled 2017-05-09 (×2): qty 1

## 2017-05-09 MED ORDER — SALINE SPRAY 0.65 % NA SOLN
1.0000 | NASAL | Status: DC | PRN
  Administered 2017-05-10 (×2): 1 via NASAL
  Filled 2017-05-09: qty 44

## 2017-05-09 MED ORDER — PEG-KCL-NACL-NASULF-NA ASC-C 100 G PO SOLR
0.5000 | Freq: Once | ORAL | Status: AC
  Administered 2017-05-10: 100 g via ORAL

## 2017-05-09 MED ORDER — PEG-KCL-NACL-NASULF-NA ASC-C 100 G PO SOLR: 1.0000 | Freq: Once | ORAL | Status: DC

## 2017-05-09 MED ORDER — PEG-KCL-NACL-NASULF-NA ASC-C 100 G PO SOLR
0.5000 | Freq: Once | ORAL | Status: AC
  Administered 2017-05-09: 100 g via ORAL
  Filled 2017-05-09: qty 1

## 2017-05-09 NOTE — Progress Notes (Signed)
CRITICAL VALUE ALERT  Critical Value:  Lactic acid 2.1 Date & Time Notied:  05/09/17  Provider Notified: Dr. Hal Hope  Orders Received/Actions taken: No new orders; continue to monitor patient.

## 2017-05-09 NOTE — Progress Notes (Signed)
Pt. is requesting a stool softner.  Text paged Dr. Charlies Silvers and informed.  Will continue to monitor and await for a return call or new orders. Alphonzo Lemmings, RN

## 2017-05-09 NOTE — Progress Notes (Signed)
Nutrition Brief Note  Patient identified on the Malnutrition Screening Tool (MST) Report  Wt Readings from Last 15 Encounters:  05/08/17 255 lb 3.2 oz (115.8 kg)  04/03/17 260 lb 3.2 oz (118 kg)  03/30/17 257 lb 8 oz (116.8 kg)  02/22/17 256 lb (116.1 kg)  11/16/16 255 lb (115.7 kg)  11/08/16 258 lb (117 kg)  10/24/16 258 lb (117 kg)  10/09/16 270 lb (122.5 kg)  08/02/16 267 lb (121.1 kg)  07/26/16 283 lb (128.4 kg)  02/10/16 259 lb (117.5 kg)  10/25/15 265 lb (120.2 kg)  09/17/15 268 lb (121.6 kg)  08/20/15 289 lb 3.2 oz (131.2 kg)  07/27/15 272 lb (123.4 kg)   [name] is a 76 y.o. male with medical history significant of diabetes, MI, insomnia, hyperlipidemia, GERD/GI bleed, hypertension, COPD, diverticulosis, AAA, colonic AVM, bipolar, CHF.  Pt reports good appetite PTA. He shares she eats 1-2 meals and several snacks throughout the days, such as chips and candy bars. He is tolerating clear liquid diet well, however, is frustrated that diet cannot be advanced. He did understand rationale for diet order.   Pt shares he has lost about 9# within the past month, however, this is not consistent with wt hx.   Nutrition-Focused physical exam completed. Findings are no fat depletion, no muscle depletion, and no edema.   Body mass index is 32.33 kg/m. Patient meets criteria for obesity, class I based on current BMI.   Current diet order is clear liquid, patient is consuming approximately n/a% of meals at this time. Labs and medications reviewed.   No nutrition interventions warranted at this time. If nutrition issues arise, please consult RD.   Jimena Wieczorek A. Jimmye Norman, RD, LDN, CDE Pager: [phone] After hours Pager: [phone]

## 2017-05-09 NOTE — Consult Note (Addendum)
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 contrast opacification of the pulmonary arteries. No evidence of central pulmonary embolism to the lobar level. Segmental and subsegmental pulmonary emboli cannot be evaluated. 5.2 cm infrarenal abdominal aortic aneurysm status post aorto bi-iliac stent, grossly unchanged. Sigmoid diverticulosis, without evidence of diverticulitis. Additional ancillary findings as above. Aortic Atherosclerosis (ICD10-I70.0) and Emphysema (ICD10-J43.9). Electronically Signed   By: Julian Hy M.D.   On: 05/08/2017 10:35   Dg Chest Portable 1 View  Result Date: 05/08/2017 CLINICAL DATA:  Shortness of breath EXAM: PORTABLE CHEST 1 VIEW COMPARISON:  Chest radiographs 04/03/2017 FINDINGS: Findings of prior CABG. There is cardiomegaly with bibasilar atelectasis. No pneumothorax or sizable pleural effusion. Hiatal hernia is less clearly visualized. No focal airspace consolidation or overt pulmonary edema. IMPRESSION: Cardiomegaly and bibasilar atelectasis. Electronically Signed   By: Ulyses Jarred M.D.   On: 05/08/2017 05:56     Tye Savoy,  NP-C @  05/09/2017, 12:02 PM  Pager number [phone]  I have reviewed the entire case in detail with the  above APP and discussed the plan in detail.  Therefore, I agree with the diagnoses recorded above. In addition,  I have personally interviewed and examined the patient and have personally reviewed any abdominal/pelvic CT scan images.  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 None seen on recent video capsule study. 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    I would like him to have both EGD and colonoscopy tomorrow.  He will not consent to both at once b/c he was once told by anesthesia that he should never do that. So we will start with the colonoscopy tomorrow and see what we find.    The benefits and risks of the planned procedure were described in detail with the patient or (when appropriate) their health care proxy.  Risks were outlined as including, but not limited to, bleeding, infection, perforation, adverse medication reaction leading to cardiac or pulmonary decompensation, or pancreatitis (if ERCP).  The limitation of incomplete mucosal visualization was also discussed.  No guarantees or warranties were given.  Patient at increased risk for cardiopulmonary complications of procedure due to medical comorbidities.   Prep orders to follow soon - nursing aware of need to get prep started by [facility] Pager [phone]  Mon-Fri 8a-5p [phone] after 5p, weekends, holidays

## 2017-05-09 NOTE — Progress Notes (Signed)
Patient ID: [name], male   DOB: 03/12/1941, 76 y.o.   MRN: [id number]  PROGRESS NOTE    [name]  ZHG:[id number] DOB: Sep 21, 1941 DOA: 05/08/2017  PCP: [name], MD   Brief Narrative:  76 year old male with medical history of diabetes, MI, dyslipidemia, GERD and GI bleed, hypertension, COPD, AAA, colonic AVM, underwent numerous upper endoscopies with ablation, bipolar disorder and CHF who presented to ED with shortness of breath and abdominal pain for past 3 days prior to the admission. Pt laso reported passing dark stools at home and having black stool 7/24. No acute findings on CXR. His baseline hgb was 9.5 and on this admission hgb is down to 7.6 and with 2 U pRBC hgb was 9.0. GI has seen pt in consultation.    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 - Pt has large hiatal hernia which can be associated with cameron lesion that can bleed - He had 2 gram drop in hgb and heme positive stools - Per GI, he may need repeat enteroscopy to look for active bleeding lesions - Continue protonix 40 mg BID - NPO post midnight per GI - Continue IV fluids for hydration while NPO  Generalized abdominal discomfort - No acute findings on non contrast CT scan - Lipase and LFT's normal - Possibly due to constipation - Added senna daily   Dyslipidemia associated with type 2 DM - Continue Lipitor  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 - Continue Lantus 25 units BID - Continue SSI - Continue gabapentin   COPD (chronic obstructive pulmonary disease) (HCC) - Stable respiratory status   Diastolic HF (heart failure) (HCC) - ECHO in 04/2015 showed preserved EF, grade 1 DD  CKD stage 3 - Cr 1.53 in 03/2017 - Cr on this admission within baseline range  Lactic acidosis - Due to metformin - Continue to monitor lactic acid  DVT prophylaxis: SCD's    Code Status: full code  Family Communication: no family at the bedside  Disposition Plan: home once cleared by GI   Consultants:   [facility]  Procedures:   None  Antimicrobials:   None    Subjective: No overnight events.   Objective: Vitals:   05/08/17 2339 05/09/17 0003 05/09/17 0232 05/09/17 0704  BP: (!) 106/53 107/60 117/64 (!) 111/49  Pulse: 80 81 76 73  Resp: 19 18 17 18   Temp: 98.1 F (36.7 C) 98.2 F (36.8 C) 98 F (36.7 C) 98.1 F (36.7 C)  TempSrc: Oral Oral Oral Oral  SpO2: 98% 99% 100% 99%  Weight:      Height:        Intake/Output Summary (Last 24 hours) at 05/09/17 1148 Last data filed at 05/09/17 0846  Gross per 24 hour  Intake          1693.83 ml  Output             1125 ml  Net           568.83 ml   Filed Weights   05/08/17 0521 05/08/17 1433  Weight: 118.8 kg (262 lb) 115.8 kg (255 lb 3.2 oz)    Examination:  General exam: Appears calm and comfortable  Respiratory system: Clear to auscultation. Respiratory effort normal. Cardiovascular system: S1 & S2 heard, Rate controlled  Gastrointestinal system: Abdomen is nondistended, soft and nontender. No organomegaly or masses felt. Normal bowel  sounds heard. Central nervous system: Alert and oriented. No focal neurological deficits. Extremities: Symmetric 5 x 5 power. Skin: No rashes, lesions or ulcers Psychiatry: Judgement and insight appear normal. Mood & affect appropriate.   Data Reviewed: I have personally reviewed following labs and imaging studies  CBC:  Recent Labs Lab 05/08/17 0559 05/08/17 1233 05/09/17 0504  WBC 14.5* 8.4 8.3  NEUTROABS 11.1*  --   --   HGB 8.7* 7.6* 9.0*  HCT 29.3* 24.9* 29.0*  MCV 96.1 95.0 92.1  PLT 227 127* 505*   Basic Metabolic Panel:  Recent Labs Lab 05/08/17 0559 05/09/17 0504  NA 137 137  K 5.3* 4.5  CL 96* 100*  CO2 32 30  GLUCOSE 263* 149*  BUN 36* 28*  CREATININE 1.40* 1.28*  CALCIUM 8.7* 8.4*   GFR: Estimated Creatinine  Clearance: 68 mL/min (A) (by C-G formula based on SCr of 1.28 mg/dL (H)). Liver Function Tests:  Recent Labs Lab 05/08/17 0559  AST 17  ALT 12*  ALKPHOS 112  BILITOT 0.5  PROT 5.7*  ALBUMIN 3.1*    Recent Labs Lab 05/08/17 0559  LIPASE 39   No results for input(s): AMMONIA in the last 168 hours. Coagulation Profile: No results for input(s): INR, PROTIME in the last 168 hours. Cardiac Enzymes: No results for input(s): CKTOTAL, CKMB, CKMBINDEX, TROPONINI in the last 168 hours. BNP (last 3 results) No results for input(s): PROBNP in the last 8760 hours. HbA1C: No results for input(s): HGBA1C in the last 72 hours. CBG:  Recent Labs Lab 05/08/17 1635 05/08/17 2054 05/09/17 0726  GLUCAP 205* 194* 185*   Lipid Profile: No results for input(s): CHOL, HDL, LDLCALC, TRIG, CHOLHDL, LDLDIRECT in the last 72 hours. Thyroid Function Tests: No results for input(s): TSH, T4TOTAL, FREET4, T3FREE, THYROIDAB in the last 72 hours. Anemia Panel: No results for input(s): VITAMINB12, FOLATE, FERRITIN, TIBC, IRON, RETICCTPCT in the last 72 hours. Urine analysis:    Component Value Date/Time   COLORURINE YELLOW 05/08/2017 [place] 05/08/2017 0614   LABSPEC 1.015 05/08/2017 0614   PHURINE 5.0 05/08/2017 0614   GLUCOSEU NEGATIVE 05/08/2017 0614   HGBUR NEGATIVE 05/08/2017 0614   BILIRUBINUR NEGATIVE 05/08/2017 0614   BILIRUBINUR small 02/13/2015 1354   KETONESUR NEGATIVE 05/08/2017 0614   PROTEINUR NEGATIVE 05/08/2017 0614   UROBILINOGEN 0.2 02/13/2015 1354   UROBILINOGEN 0.2 08/24/2014 1948   NITRITE NEGATIVE 05/08/2017 0614   LEUKOCYTESUR NEGATIVE 05/08/2017 0614   Sepsis Labs: @LABRCNTIP (procalcitonin:4,lacticidven:4)   )No results found for this or any previous visit (from the past 240 hour(s)).    Radiology Studies: Ct Angio Chest Pe W And/or Wo Contrast  Result Date: 05/08/2017 CLINICAL DATA:  Shortness of breath, abdominal pain, leukocytosis EXAM:  CT ANGIOGRAPHY CHEST CT ABDOMEN AND PELVIS WITH CONTRAST TECHNIQUE: Multidetector CT imaging of the chest was performed using the standard protocol during bolus administration of intravenous contrast. Multiplanar CT image reconstructions and MIPs were obtained to evaluate the vascular anatomy. Multidetector CT imaging of the abdomen and pelvis was performed using the standard protocol during bolus administration of intravenous contrast. CONTRAST:  100 mL Isovue 370 COMPARISON:  CTA chest dated 07/01/2015. CT abdomen/ pelvis dated 04/22/2015. FINDINGS: CTA CHEST FINDINGS Cardiovascular: Suboptimal contrast opacification of the pulmonary arteries due to limited rate of injection (affecting contrast bolus), secondary to poor IV access. No evidence of central pulmonary embolism to the lobar level. Segmental and subsegmental pulmonary emboli cannot be evaluated. No evidence of thoracic aortic aneurysm or  dissection. Atherosclerotic calcifications of the aortic arch. The heart is top-normal in size.  No pericardial effusion. Coronary atherosclerosis the LAD and right coronary artery. Postsurgical changes related to prior CABG. Mediastinum/Nodes: No suspicious mediastinal lymphadenopathy. Visualized thyroid is unremarkable. Lungs/Pleura: Bullous cavity in the left lung apex. Mild dependent atelectasis in the bilateral lower lobes. No suspicious pulmonary nodules. No focal consolidation. No pleural effusion or pneumothorax. Musculoskeletal: Thoracic spine is within normal limits. Median sternotomy. Review of the MIP images confirms the above findings. CT ABDOMEN and PELVIS FINDINGS Hepatobiliary: Liver is within normal limits. Gallbladder is within normal limits. No intrahepatic or extrahepatic ductal dilatation. Pancreas: Within normal limits. Spleen: Within normal limits. Adrenals/Urinary Tract: Adrenal glands within normal limits. 9 mm right lower pole renal cyst (series 14/ image 45). Segmental scarring/atrophy along  the right lower pole. Left kidney is within normal limits. No hydronephrosis. Bladder is within normal limits. Stomach/Bowel: Stomach is notable for a large hiatal hernia/inverted intrathoracic stomach. No evidence of bowel obstruction. Status post appendectomy. Sigmoid diverticulosis, without evidence of diverticulitis. No colonic wall thickening or mass is evident on CT. Vascular/Lymphatic: 5.2 x 5.2 cm infrarenal abdominal aortic aneurysm (series 14/image 55) with indwelling aorto bi-iliac stent, previously 5 1 x 5.1 cm. No suspicious abdominopelvic lymphadenopathy. Reproductive: Prostate is unremarkable. Other: No abdominopelvic ascites. Small fat containing right inguinal hernia. Musculoskeletal: Mild degenerative changes of the lumbar spine. Review of the MIP images confirms the above findings. IMPRESSION: Suboptimal contrast opacification of the pulmonary arteries. No evidence of central pulmonary embolism to the lobar level. Segmental and subsegmental pulmonary emboli cannot be evaluated. 5.2 cm infrarenal abdominal aortic aneurysm status post aorto bi-iliac stent, grossly unchanged. Sigmoid diverticulosis, without evidence of diverticulitis. Additional ancillary findings as above. Aortic Atherosclerosis (ICD10-I70.0) and Emphysema (ICD10-J43.9). Electronically Signed   By: Julian Hy M.D.   On: 05/08/2017 10:35   Ct Abdomen Pelvis W Contrast  Result Date: 05/08/2017 CLINICAL DATA:  Shortness of breath, abdominal pain, leukocytosis EXAM: CT ANGIOGRAPHY CHEST CT ABDOMEN AND PELVIS WITH CONTRAST TECHNIQUE: Multidetector CT imaging of the chest was performed using the standard protocol during bolus administration of intravenous contrast. Multiplanar CT image reconstructions and MIPs were obtained to evaluate the vascular anatomy. Multidetector CT imaging of the abdomen and pelvis was performed using the standard protocol during bolus administration of intravenous contrast. CONTRAST:  100 mL Isovue  370 COMPARISON:  CTA chest dated 07/01/2015. CT abdomen/ pelvis dated 04/22/2015. FINDINGS: CTA CHEST FINDINGS Cardiovascular: Suboptimal contrast opacification of the pulmonary arteries due to limited rate of injection (affecting contrast bolus), secondary to poor IV access. No evidence of central pulmonary embolism to the lobar level. Segmental and subsegmental pulmonary emboli cannot be evaluated. No evidence of thoracic aortic aneurysm or dissection. Atherosclerotic calcifications of the aortic arch. The heart is top-normal in size.  No pericardial effusion. Coronary atherosclerosis the LAD and right coronary artery. Postsurgical changes related to prior CABG. Mediastinum/Nodes: No suspicious mediastinal lymphadenopathy. Visualized thyroid is unremarkable. Lungs/Pleura: Bullous cavity in the left lung apex. Mild dependent atelectasis in the bilateral lower lobes. No suspicious pulmonary nodules. No focal consolidation. No pleural effusion or pneumothorax. Musculoskeletal: Thoracic spine is within normal limits. Median sternotomy. Review of the MIP images confirms the above findings. CT ABDOMEN and PELVIS FINDINGS Hepatobiliary: Liver is within normal limits. Gallbladder is within normal limits. No intrahepatic or extrahepatic ductal dilatation. Pancreas: Within normal limits. Spleen: Within normal limits. Adrenals/Urinary Tract: Adrenal glands within normal limits. 9 mm right lower pole renal cyst (series  14/ image 45). Segmental scarring/atrophy along the right lower pole. Left kidney is within normal limits. No hydronephrosis. Bladder is within normal limits. Stomach/Bowel: Stomach is notable for a large hiatal hernia/inverted intrathoracic stomach. No evidence of bowel obstruction. Status post appendectomy. Sigmoid diverticulosis, without evidence of diverticulitis. No colonic wall thickening or mass is evident on CT. Vascular/Lymphatic: 5.2 x 5.2 cm infrarenal abdominal aortic aneurysm (series 14/image 55)  with indwelling aorto bi-iliac stent, previously 5 1 x 5.1 cm. No suspicious abdominopelvic lymphadenopathy. Reproductive: Prostate is unremarkable. Other: No abdominopelvic ascites. Small fat containing right inguinal hernia. Musculoskeletal: Mild degenerative changes of the lumbar spine. Review of the MIP images confirms the above findings. IMPRESSION: Suboptimal contrast opacification of the pulmonary arteries. No evidence of central pulmonary embolism to the lobar level. Segmental and subsegmental pulmonary emboli cannot be evaluated. 5.2 cm infrarenal abdominal aortic aneurysm status post aorto bi-iliac stent, grossly unchanged. Sigmoid diverticulosis, without evidence of diverticulitis. Additional ancillary findings as above. Aortic Atherosclerosis (ICD10-I70.0) and Emphysema (ICD10-J43.9). Electronically Signed   By: Julian Hy M.D.   On: 05/08/2017 10:35   Dg Chest Portable 1 View  Result Date: 05/08/2017 CLINICAL DATA:  Shortness of breath EXAM: PORTABLE CHEST 1 VIEW COMPARISON:  Chest radiographs 04/03/2017 FINDINGS: Findings of prior CABG. There is cardiomegaly with bibasilar atelectasis. No pneumothorax or sizable pleural effusion. Hiatal hernia is less clearly visualized. No focal airspace consolidation or overt pulmonary edema. IMPRESSION: Cardiomegaly and bibasilar atelectasis. Electronically Signed   By: Ulyses Jarred M.D.   On: 05/08/2017 05:56      Scheduled Meds: . atorvastatin  40 mg Oral QHS  . [START ON 05/10/2017] citalopram  20 mg Oral Daily  . feeding supplement  1 Container Oral TID BM  . gabapentin  300 mg Oral TID  . insulin aspart  0-5 Units Subcutaneous QHS  . insulin aspart  0-9 Units Subcutaneous TID WC  . insulin glargine  25 Units Subcutaneous BID  . mometasone-formoterol  2 puff Inhalation BID  . pantoprazole (PROTONIX) IV  40 mg Intravenous Q12H   Continuous Infusions: . sodium chloride 1,000 mL (05/09/17 0844)     LOS: 0 days     Time spent: 25  minutes  Greater than 50% of the time spent on counseling and coordinating the care.   Leisa Lenz, MD [facility] Pager [phone]  If 7PM-7AM, please contact night-coverage [email or web address] Password [facility] 05/09/2017, 11:48 AM

## 2017-05-10 ENCOUNTER — Inpatient Hospital Stay (HOSPITAL_COMMUNITY): Payer: Medicare Other | Admitting: Anesthesiology

## 2017-05-10 ENCOUNTER — Encounter (HOSPITAL_COMMUNITY)
Admission: EM | Disposition: A | Discharge status: Home / Self Care | Payer: Self-pay | Source: Home / Self Care | Attending: Family Medicine

## 2017-05-10 ENCOUNTER — Encounter (HOSPITAL_COMMUNITY): Payer: Self-pay | Admitting: *Deleted

## 2017-05-10 DIAGNOSIS — K5521 Angiodysplasia of colon with hemorrhage: Secondary | ICD-10-CM | POA: Diagnosis not present

## 2017-05-10 DIAGNOSIS — K5730 Diverticulosis of large intestine without perforation or abscess without bleeding: Secondary | ICD-10-CM | POA: Diagnosis not present

## 2017-05-10 DIAGNOSIS — K5710 Diverticulosis of small intestine without perforation or abscess without bleeding: Secondary | ICD-10-CM | POA: Diagnosis not present

## 2017-05-10 DIAGNOSIS — D649 Anemia, unspecified: Secondary | ICD-10-CM | POA: Diagnosis not present

## 2017-05-10 DIAGNOSIS — J9611 Chronic respiratory failure with hypoxia: Secondary | ICD-10-CM | POA: Diagnosis not present

## 2017-05-10 DIAGNOSIS — K3189 Other diseases of stomach and duodenum: Secondary | ICD-10-CM | POA: Diagnosis not present

## 2017-05-10 DIAGNOSIS — K922 Gastrointestinal hemorrhage, unspecified: Secondary | ICD-10-CM | POA: Diagnosis not present

## 2017-05-10 DIAGNOSIS — K449 Diaphragmatic hernia without obstruction or gangrene: Secondary | ICD-10-CM

## 2017-05-10 DIAGNOSIS — N179 Acute kidney failure, unspecified: Secondary | ICD-10-CM | POA: Diagnosis not present

## 2017-05-10 DIAGNOSIS — E785 Hyperlipidemia, unspecified: Secondary | ICD-10-CM | POA: Diagnosis not present

## 2017-05-10 DIAGNOSIS — D62 Acute posthemorrhagic anemia: Secondary | ICD-10-CM | POA: Diagnosis not present

## 2017-05-10 DIAGNOSIS — I5032 Chronic diastolic (congestive) heart failure: Secondary | ICD-10-CM | POA: Diagnosis not present

## 2017-05-10 HISTORY — PX: COLONOSCOPY: SHX5424

## 2017-05-10 HISTORY — PX: ESOPHAGOGASTRODUODENOSCOPY: SHX5428

## 2017-05-10 LAB — CBC
HCT: 29 % — ABNORMAL LOW (ref 39.0–52.0)
Hemoglobin: 9 g/dL — ABNORMAL LOW (ref 13.0–17.0)
MCH: 29.3 pg (ref 26.0–34.0)
MCHC: 31 g/dL (ref 30.0–36.0)
MCV: 94.5 fL (ref 78.0–100.0)
PLATELETS: 148 10*3/uL — AB (ref 150–400)
RBC: 3.07 MIL/uL — ABNORMAL LOW (ref 4.22–5.81)
RDW: 21.3 % — ABNORMAL HIGH (ref 11.5–15.5)
WBC: 6.2 10*3/uL (ref 4.0–10.5)

## 2017-05-10 LAB — BASIC METABOLIC PANEL
Anion gap: 10 (ref 5–15)
BUN: 14 mg/dL (ref 6–20)
CO2: 28 mmol/L (ref 22–32)
CREATININE: 1.02 mg/dL (ref 0.61–1.24)
Calcium: 8.2 mg/dL — ABNORMAL LOW (ref 8.9–10.3)
Chloride: 103 mmol/L (ref 101–111)
GFR calc Af Amer: >60 mL/min (ref >60)
GLUCOSE: 134 mg/dL — AB (ref 65–99)
Potassium: 3.9 mmol/L (ref 3.5–5.1)
SODIUM: 141 mmol/L (ref 135–145)

## 2017-05-10 LAB — GLUCOSE, CAPILLARY
GLUCOSE-CAPILLARY: 139 mg/dL — AB (ref 65–99)
GLUCOSE-CAPILLARY: 130 mg/dL — AB (ref 65–99)
GLUCOSE-CAPILLARY: 178 mg/dL — AB (ref 65–99)
Glucose-Capillary: 136 mg/dL — ABNORMAL HIGH (ref 65–99)

## 2017-05-10 SURGERY — COLONOSCOPY
Anesthesia: Monitor Anesthesia Care

## 2017-05-10 MED ORDER — PROPOFOL 10 MG/ML IV BOLUS
INTRAVENOUS | Status: DC | PRN
  Administered 2017-05-10: 30 mg via INTRAVENOUS

## 2017-05-10 MED ORDER — PROPOFOL 500 MG/50ML IV EMUL
INTRAVENOUS | Status: DC | PRN
  Administered 2017-05-10: 100 ug/kg/min via INTRAVENOUS

## 2017-05-10 MED ORDER — LACTATED RINGERS IV SOLN
INTRAVENOUS | Status: DC | PRN
  Administered 2017-05-10: 16:00:00 via INTRAVENOUS

## 2017-05-10 MED ORDER — LACTATED RINGERS IV SOLN
INTRAVENOUS | Status: DC
  Administered 2017-05-10: 15:00:00 via INTRAVENOUS

## 2017-05-10 NOTE — Transfer of Care (Signed)
Immediate Anesthesia Transfer of Care Note  Patient: [name]  Procedure(s) Performed: Procedure(s): COLONOSCOPY (N/A)  Patient Location: Endoscopy Unit  Anesthesia Type:MAC  Level of Consciousness: awake, alert  and oriented  Airway & Oxygen Therapy: Patient Spontanous Breathing and Patient connected to nasal cannula oxygen  Post-op Assessment: Report given to RN and Post -op Vital signs reviewed and stable  Post vital signs: Reviewed and stable  Last Vitals:  Vitals:   05/10/17 1340 05/10/17 1500  BP: 117/60 133/74  Pulse: 81   Resp: 18 12  Temp: 36.4 C 36.6 C    Last Pain:  Vitals:   05/10/17 1500  TempSrc: Oral  PainSc:          Complications: No apparent anesthesia complications

## 2017-05-10 NOTE — Progress Notes (Signed)
PROGRESS NOTE    [name]  HDQ:[id number] DOB: [birth year] DOA: 05/08/2017 PCP: [name], MD   Brief Narrative: [name] is a 76 y.o. male with a history of diabetes, MI, insomnia, hyperlipidemia, GERD, GI bleed, hypertension, COPD, diverticulosis, AAA, colonic AVM, bipolar, CHF. Patient presents with abdominal pain and dyspnea was found to have fecal occult positive blood in addition to anemia. Unknown etiology for his abdominal pain. GI was consulted and is planning to perform colonoscopy/EGD today.   Assessment & Plan:   Active Problems:   Hyperlipidemia   COPD (chronic obstructive pulmonary disease) (HCC)   Diastolic HF (heart failure) (HCC)   Chronic GI bleeding   Chronic hypoxemic respiratory failure (HCC)   Acute blood loss anemia   AVM (arteriovenous malformation) of colon   Insomnia   Gastric AVM   AVM (arteriovenous malformation) of duodenum, acquired with hemorrhage   GI bleed   AKI (acute kidney injury) ([place])   Diabetes mellitus with complication (HCC)   GI bleeding Fecal occult blood testing positive. No hematochezia. Just melena. Received 2 units of PRBCs yesterday. Hemoglobin stable. -GI recommendations: colonoscopy today -Repeat CBC in a.m.  Blood loss anemia Management above.  Acute kidney injury Resolved with IV fluids  Hyperkalemia Improved overnight.  Abdominal pain CT abdomen pelvis without acute abnormality. Resolved today  Hypotension Resolved with IV fluids  Diabetes mellitus -Continue Lantus -Continue sliding-scale insulin  COPD -continue baseline O2 -continue inhalers  Chronic pain -continue Tramadol and Neurontin  Insomnia -continue Trazodone  Chronic diastolic heart failure Stable  Depression Anxiety -Continue Celexa -Continue Xanax  Hyperlipidemia -Continue statin   DVT prophylaxis: SCDs Code Status: Full code Family Communication: None at bedside Disposition Plan: Discharge likely 24  hours   Consultants:   [facility]  Procedures:   Colonoscopy/EGD  Antimicrobials:  None    Subjective: Patient has had black stools overnight.  Objective: Vitals:   05/09/17 2119 05/10/17 0524 05/10/17 0854 05/10/17 1340  BP: 140/84 118/65  117/60  Pulse: (!) 48 87  81  Resp: 18 18  18   Temp: 98.2 F (36.8 C) 97.9 F (36.6 C)  97.6 F (36.4 C)  TempSrc:      SpO2: 100% 94% 93% 98%  Weight:      Height:        Intake/Output Summary (Last 24 hours) at 05/10/17 1446 Last data filed at 05/10/17 0900  Gross per 24 hour  Intake          1098.34 ml  Output             1775 ml  Net          -676.66 ml   Filed Weights   05/08/17 0521 05/08/17 1433  Weight: 118.8 kg (262 lb) 115.8 kg (255 lb 3.2 oz)    Examination:  General exam: Appears calm and comfortable Respiratory system: Clear to auscultation. Respiratory effort normal. Cardiovascular system: S1 & S2 heard, RRR. No murmurs. Gastrointestinal system: Abdomen is distended, soft and nontender. Normal bowel sounds heard. Central nervous system: Alert and oriented. No focal neurological deficits. Extremities: No edema. No calf tenderness Skin: No cyanosis. No rashes Psychiatry: Judgement and insight appear normal. Mood & affect agitated and flat.     Data Reviewed: I have personally reviewed following labs and imaging studies  CBC:  Recent Labs Lab 05/08/17 0559 05/08/17 1233 05/09/17 0504 05/10/17 0503  WBC 14.5* 8.4 8.3 6.2  NEUTROABS 11.1*  --   --   --  HGB 8.7* 7.6* 9.0* 9.0*  HCT 29.3* 24.9* 29.0* 29.0*  MCV 96.1 95.0 92.1 94.5  PLT 227 127* 145* 277*   Basic Metabolic Panel:  Recent Labs Lab 05/08/17 0559 05/09/17 0504 05/10/17 0503  NA 137 137 141  K 5.3* 4.5 3.9  CL 96* 100* 103  CO2 32 30 28  GLUCOSE 263* 149* 134*  BUN 36* 28* 14  CREATININE 1.40* 1.28* 1.02  CALCIUM 8.7* 8.4* 8.2*   GFR: Estimated Creatinine Clearance: 85.3 mL/min (by C-G formula based on SCr of 1.02  mg/dL). Liver Function Tests:  Recent Labs Lab 05/08/17 0559  AST 17  ALT 12*  ALKPHOS 112  BILITOT 0.5  PROT 5.7*  ALBUMIN 3.1*    Recent Labs Lab 05/08/17 0559  LIPASE 39   No results for input(s): AMMONIA in the last 168 hours. Coagulation Profile: No results for input(s): INR, PROTIME in the last 168 hours. Cardiac Enzymes: No results for input(s): CKTOTAL, CKMB, CKMBINDEX, TROPONINI in the last 168 hours. BNP (last 3 results) No results for input(s): PROBNP in the last 8760 hours. HbA1C: No results for input(s): HGBA1C in the last 72 hours. CBG:  Recent Labs Lab 05/09/17 1232 05/09/17 1641 05/09/17 2118 05/10/17 0733 05/10/17 1155  GLUCAP 220* 197* 129* 139* 136*   Lipid Profile: No results for input(s): CHOL, HDL, LDLCALC, TRIG, CHOLHDL, LDLDIRECT in the last 72 hours. Thyroid Function Tests: No results for input(s): TSH, T4TOTAL, FREET4, T3FREE, THYROIDAB in the last 72 hours. Anemia Panel: No results for input(s): VITAMINB12, FOLATE, FERRITIN, TIBC, IRON, RETICCTPCT in the last 72 hours. Sepsis Labs:  Recent Labs Lab 05/08/17 1325 05/08/17 1427 05/08/17 1612 05/09/17 0504  LATICACIDVEN 2.4* 2.2* 2.5* 2.1*    No results found for this or any previous visit (from the past 240 hour(s)).       Radiology Studies: No results found.      Scheduled Meds: . atorvastatin  40 mg Oral QHS  . citalopram  20 mg Oral Daily  . gabapentin  300 mg Oral TID  . insulin aspart  0-5 Units Subcutaneous QHS  . insulin aspart  0-9 Units Subcutaneous TID WC  . insulin glargine  25 Units Subcutaneous BID  . mometasone-formoterol  2 puff Inhalation BID  . pantoprazole (PROTONIX) IV  40 mg Intravenous Q12H  . senna  1 tablet Oral Daily   Continuous Infusions: . sodium chloride 50 mL/hr at 05/10/17 0458     LOS: 1 day     Cordelia Poche, MD [facility] 05/10/2017, 2:46 PM Pager: [phone]  If 7PM-7AM, please contact  night-coverage [email or web address] Password [facility] 05/10/2017, 2:46 PM

## 2017-05-10 NOTE — Progress Notes (Signed)
I saw this patient in the pre-procedure area a second time along with Dr Josephine Igo of Anesthesiology.  [name] is now agreeable to an EGD in addition to a colonoscopy today.  The consent was adjusted to account for this and initialed by the patient and myself.  Endoscopy staff and CRNA all made aware.  [name]

## 2017-05-10 NOTE — Anesthesia Postprocedure Evaluation (Signed)
Anesthesia Post Note  Patient: [name]  Procedure(s) Performed: Procedure(s) (LRB): COLONOSCOPY (N/A) ESOPHAGOGASTRODUODENOSCOPY (EGD) (N/A)     Patient location during evaluation: PACU Anesthesia Type: MAC Level of consciousness: awake and alert and oriented Pain management: pain level controlled Vital Signs Assessment: post-procedure vital signs reviewed and stable Respiratory status: spontaneous breathing, nonlabored ventilation, respiratory function stable and patient connected to nasal cannula oxygen Cardiovascular status: stable and blood pressure returned to baseline Postop Assessment: no signs of nausea or vomiting Anesthetic complications: no    Last Vitals:  Vitals:   05/10/17 1730 05/10/17 1740  BP: 135/82 128/61  Pulse: 80 (!) 43  Resp: 16 18  Temp:      Last Pain:  Vitals:   05/10/17 1719  TempSrc: Oral  PainSc:                  [name] A.

## 2017-05-10 NOTE — Anesthesia Procedure Notes (Signed)
Procedure Name: MAC Date/Time: 05/10/2017 4:35 PM Performed by: Eligha Bridegroom Pre-anesthesia Checklist: Patient identified, Emergency Drugs available, Patient being monitored, Suction available and Timeout performed Patient Re-evaluated:Patient Re-evaluated prior to induction Oxygen Delivery Method: Nasal cannula Preoxygenation: Pre-oxygenation with 100% oxygen Induction Type: IV induction

## 2017-05-10 NOTE — Op Note (Signed)
[facility] Patient Name: [name] Procedure Date : 05/10/2017 MRN: [id number] Attending MD: Estill Cotta. Loletha Carrow , MD Date of Birth: 1941-01-19 CSN: [id number] Age: 76 Admit Type: Inpatient Procedure:                Colonoscopy Indications:              Melena, Acute post hemorrhagic anemia (recent right                            colon AVMs ablated in the setting of acute GI                            bleeding) Providers:                Estill Cotta. Loletha Carrow, MD, Lillie Fragmin, RN, Elspeth Cho Tech., Technician, Eligha Bridegroom CRNA, CRNA Referring MD:              Medicines:                Monitored Anesthesia Care Complications:            No immediate complications. Estimated Blood Loss:     Estimated blood loss: none. Estimated blood loss:                            none. Procedure:                Pre-Anesthesia Assessment:                           - Prior to the procedure, a History and Physical                            was performed, and patient medications and                            allergies were reviewed. The patient's tolerance of                            previous anesthesia was also reviewed. The risks                            and benefits of the procedure and the sedation                            options and risks were discussed with the patient.                            All questions were answered, and informed consent                            was obtained. Prior Anticoagulants: The patient has  taken no previous anticoagulant or antiplatelet                            agents. ASA Grade Assessment: III - A patient with                            severe systemic disease. After reviewing the risks                            and benefits, the patient was deemed in                            satisfactory condition to undergo the procedure.                           After obtaining informed  consent, the colonoscope                            was passed under direct vision. Throughout the                            procedure, the patient's blood pressure, pulse, and                            oxygen saturations were monitored continuously. The                            EC-[id number] ([id number]) scope was introduced through                            the anus and advanced to the the cecum, identified                            by appendiceal orifice and ileocecal valve. The                            colonoscopy was performed without difficulty. The                            patient tolerated the procedure well. The quality                            of the bowel preparation was fair. The ileocecal                            valve, appendiceal orifice, and rectum were                            photographed. The bowel preparation used was                            MoviPrep. Scope In: 4:43:19 PM Scope Out: 4:55:38 PM Scope Withdrawal Time: 0 hours 9 minutes 5 seconds  Total Procedure Duration: 0 hours 12  minutes 19 seconds  Findings:      The perianal and digital rectal examinations were normal.      Multiple diverticula were found in the left colon and right colon.      The exam was otherwise without abnormality on direct and retroflexion       views. Impression:               - Preparation of the colon was fair.                           - Diverticulosis in the left colon and in the right                            colon.                           - The examination was otherwise normal on direct                            and retroflexion views.                           - No specimens collected. Moderate Sedation:      MAC sedation used Recommendation:           - Patient has a contact number available for                            emergencies. The signs and symptoms of potential                            delayed complications were discussed with the                             patient. Return to normal activities tomorrow.                            Written discharge instructions were provided to the                            patient.                           - Resume previous diet.                           - Continue present medications.                           - No recommendation at this time regarding repeat                            colonoscopy due to no evidence of mucosal or other                            abnormalities on today's exam.                           -  See the other procedure note for documentation of                            additional recommendations. Procedure Code(s):        --- Professional ---                           [id number], Colonoscopy, flexible; diagnostic, including                            collection of specimen(s) by brushing or washing,                            when performed (separate procedure) Diagnosis Code(s):        --- Professional ---                           K92.1, Melena (includes Hematochezia)                           D62, Acute posthemorrhagic anemia                           K57.30, Diverticulosis of large intestine without                            perforation or abscess without bleeding CPT copyright 2016 [employer]. All rights reserved. The codes documented in this report are preliminary and upon coder review may  be revised to meet current compliance requirements. Rogue Rafalski L. Loletha Carrow, MD 05/10/2017 5:19:07 PM This report has been signed electronically. Number of Addenda: 0

## 2017-05-10 NOTE — Op Note (Signed)
[facility] Patient Name: [name] Procedure Date : 05/10/2017 MRN: [id number] Attending MD: Estill Cotta. Loletha Carrow , MD Date of Birth: February 11, 1941 CSN: [id number] Age: 76 Admit Type: Inpatient Procedure:                Upper GI endoscopy Indications:              Acute post hemorrhagic anemia, Melena Providers:                Mallie Mussel L. Loletha Carrow, MD, Lillie Fragmin, RN, Elspeth Cho Tech., Technician, Eligha Bridegroom CRNA, CRNA Referring MD:              Medicines:                Monitored Anesthesia Care Complications:            No immediate complications. Estimated Blood Loss:     Estimated blood loss: none. Procedure:                Pre-Anesthesia Assessment:                           - Prior to the procedure, a History and Physical                            was performed, and patient medications and                            allergies were reviewed. The patient's tolerance of                            previous anesthesia was also reviewed. The risks                            and benefits of the procedure and the sedation                            options and risks were discussed with the patient.                            All questions were answered, and informed consent                            was obtained. Prior Anticoagulants: The patient has                            taken no previous anticoagulant or antiplatelet                            agents. ASA Grade Assessment: III - A patient with                            severe systemic disease. After reviewing the risks  and benefits, the patient was deemed in                            satisfactory condition to undergo the procedure.                           After obtaining informed consent, the endoscope was                            passed under direct vision. Throughout the                            procedure, the patient's blood pressure, pulse, and                             oxygen saturations were monitored continuously.The                            upper GI endoscopy was accomplished without                            difficulty. The patient tolerated the procedure                            well. The [id number] ([id number]) scope was introduced                            through the mouth, and advanced to the second part                            of duodenum. Scope In: Scope Out: Findings:      A large hiatal hernia was present. About half the stomach is involved.       This has been seen previously.      The cardia and gastric fundus were normal on retroflexion.      The stomach was normal.      A small diverticulum was found in the second portion of the duodenum.       Previously-seen finding.      A single small submucosal nodule was found in the second portion of the       duodenum. Previously-seen finding.      The exam of the duodenum was otherwise normal. Impression:               - Large hiatal hernia.                           - Normal stomach.                           - Duodenal diverticulum.                           - Submucosal nodule found in the duodenum.                           - No specimens collected.  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 This may even have been the case                            for the episode last month and AVMs were just not                            visualized on SB capsule study. He will have more                            episodes in the future, and conservative therapy                            with observation and blood product transfusion                            seems the most prudent course given the difficulty                            locaintg culprit lesions and his significantly                            increased sedation risk due to COPD. Moderate Sedation:      MAC sedation  used Recommendation:           - Patient has a contact number available for                            emergencies. The signs and symptoms of potential                            delayed complications were discussed with the                            patient. Return to normal activities tomorrow.                            Written discharge instructions were provided to the                            patient.                           - Resume previous diet.                           - Continue present medications.                           - Resume previous diet.                           - Continue present medications. Procedure Code(s):        ---  Professional ---                           [phone], Esophagogastroduodenoscopy, flexible,                            transoral; diagnostic, including collection of                            specimen(s) by brushing or washing, when performed                            (separate procedure) Diagnosis Code(s):        --- Professional ---                           K31.89, Other diseases of stomach and duodenum                           D62, Acute posthemorrhagic anemia                           K92.1, Melena (includes Hematochezia)                           K57.10, Diverticulosis of small intestine without                            perforation or abscess without bleeding CPT copyright 2016 [employer]. All rights reserved. The codes documented in this report are preliminary and upon coder review may  be revised to meet current compliance requirements. Henry L. Loletha Carrow, MD 05/10/2017 5:27:19 PM This report has been signed electronically. Number of Addenda: 0

## 2017-05-10 NOTE — H&P (View-Only) (Signed)
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 Suboptimal contrast opacification of the pulmonary arteries. No evidence of central pulmonary embolism to the lobar level. Segmental and subsegmental pulmonary emboli cannot be evaluated. 5.2 cm infrarenal abdominal aortic aneurysm status post aorto bi-iliac stent, grossly unchanged. Sigmoid diverticulosis, without evidence of diverticulitis. Additional ancillary findings as above. Aortic Atherosclerosis (ICD10-I70.0) and Emphysema (ICD10-J43.9). Electronically Signed   By: Julian Hy M.D.   On: 05/08/2017 10:35   Dg Chest Portable 1 View  Result Date: 05/08/2017 CLINICAL DATA:  Shortness of breath EXAM: PORTABLE CHEST 1 VIEW COMPARISON:  Chest radiographs 04/03/2017 FINDINGS: Findings of prior CABG. There is cardiomegaly with bibasilar atelectasis. No pneumothorax or sizable pleural effusion. Hiatal hernia is less clearly visualized. No focal airspace consolidation or overt pulmonary edema. IMPRESSION: Cardiomegaly and bibasilar atelectasis. Electronically Signed   By: Ulyses Jarred M.D.   On: 05/08/2017 05:56     Tye Savoy,  NP-C @  05/09/2017, 12:02 PM  Pager number [phone]  I have reviewed the entire case in detail with the  above APP and discussed the plan in detail.  Therefore, I agree with the diagnoses recorded above. In addition,  I have personally interviewed and examined the patient and have personally reviewed any abdominal/pelvic CT scan images.  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 None seen on recent video capsule study. 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    I would like him to have both EGD and colonoscopy tomorrow.  He will not consent to both at once b/c he was once told by anesthesia that he should never do that. So we will start with the colonoscopy tomorrow and see what we find.    The benefits and risks of the planned procedure were described in detail with the patient or (when appropriate) their health care proxy.  Risks were outlined as including, but not limited to, bleeding, infection, perforation, adverse medication reaction leading to cardiac or pulmonary decompensation, or pancreatitis (if ERCP).  The limitation of incomplete mucosal visualization was also discussed.  No guarantees or warranties were given.  Patient at increased risk for cardiopulmonary complications of procedure due to medical comorbidities.   Prep orders to follow soon - nursing aware of need to get prep started by Conway III Pager [phone]  Mon-Fri 8a-5p [phone] after 5p, weekends, holidays

## 2017-05-10 NOTE — Interval H&P Note (Signed)
History and Physical Interval Note:  05/10/2017 3:47 PM  [name]  has presented today for surgery, with the diagnosis of anemia, GI bleed  The various methods of treatment have been discussed with the patient and family. After consideration of risks, benefits and other options for treatment, the patient has consented to  Procedure(s): COLONOSCOPY (N/A) as a surgical intervention .  The patient's history has been reviewed, patient examined, no change in status, stable for surgery.  I have reviewed the patient's chart and labs.  Questions were answered to the patient's satisfaction.     Nelida Meuse III

## 2017-05-10 NOTE — Anesthesia Preprocedure Evaluation (Signed)
Anesthesia Evaluation  Patient identified by MRN, date of birth, ID band Patient awake    Reviewed: Allergy & Precautions, NPO status , Patient's Chart, lab work & pertinent test results  History of Anesthesia Complications (+) history of anesthetic complications  Airway Mallampati: II  TM Distance: >3 FB Neck ROM: Full    Dental  (+) Edentulous Upper, Edentulous Lower   Pulmonary shortness of breath, with exertion, at rest, lying and Long-Term Oxygen Therapy, pneumonia, resolved, COPD,  COPD inhaler and oxygen dependent, former smoker,    Pulmonary exam normal breath sounds clear to auscultation       Cardiovascular hypertension, Pt. 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   Neuro/Psych PSYCHIATRIC DISORDERS Anxiety Depression Bipolar Disorder Peripheral neuropathy    GI/Hepatic Neg liver ROS, PUD, GERD  Medicated and Controlled,Hx/o GI AVM's Gi bleeding   Endo/Other  diabetes, Well Controlled, Type 2, Oral Hypoglycemic Agents, Insulin DependentObesity Hyperlipidemia  Renal/GU Renal InsufficiencyRenal disease  negative genitourinary   Musculoskeletal   Abdominal (+) + obese,   Peds  Hematology  (+) anemia ,   Anesthesia Other Findings   Reproductive/Obstetrics                             Anesthesia Physical Anesthesia Plan  ASA: IV  Anesthesia Plan: MAC   Post-op Pain Management:    Induction: Intravenous  PONV Risk Score and Plan: 1 and Propofol  Airway Management Planned: Natural Airway and Nasal Cannula  Additional Equipment:   Intra-op Plan:   Post-operative Plan:   Informed Consent: I have reviewed the patients History and Physical, chart, labs and discussed the procedure  including the risks, benefits and alternatives for the proposed anesthesia with the patient or authorized representative who has indicated his/her understanding and acceptance.     Plan Discussed with: CRNA, Anesthesiologist and Surgeon  Anesthesia Plan Comments:         Anesthesia Quick Evaluation

## 2017-05-11 ENCOUNTER — Encounter (HOSPITAL_COMMUNITY): Payer: Self-pay | Admitting: Gastroenterology

## 2017-05-11 DIAGNOSIS — K5521 Angiodysplasia of colon with hemorrhage: Secondary | ICD-10-CM | POA: Diagnosis not present

## 2017-05-11 DIAGNOSIS — N179 Acute kidney failure, unspecified: Secondary | ICD-10-CM | POA: Diagnosis not present

## 2017-05-11 DIAGNOSIS — R0600 Dyspnea, unspecified: Secondary | ICD-10-CM

## 2017-05-11 DIAGNOSIS — D62 Acute posthemorrhagic anemia: Secondary | ICD-10-CM | POA: Diagnosis not present

## 2017-05-11 DIAGNOSIS — Q2733 Arteriovenous malformation of digestive system vessel: Secondary | ICD-10-CM | POA: Diagnosis not present

## 2017-05-11 DIAGNOSIS — K449 Diaphragmatic hernia without obstruction or gangrene: Secondary | ICD-10-CM | POA: Diagnosis not present

## 2017-05-11 DIAGNOSIS — K922 Gastrointestinal hemorrhage, unspecified: Secondary | ICD-10-CM | POA: Diagnosis not present

## 2017-05-11 DIAGNOSIS — D649 Anemia, unspecified: Secondary | ICD-10-CM | POA: Diagnosis not present

## 2017-05-11 LAB — CBC
HEMATOCRIT: 27.9 % — AB (ref 39.0–52.0)
HEMOGLOBIN: 8.5 g/dL — AB (ref 13.0–17.0)
MCH: 28.8 pg (ref 26.0–34.0)
MCHC: 30.5 g/dL (ref 30.0–36.0)
MCV: 94.6 fL (ref 78.0–100.0)
Platelets: 148 10*3/uL — ABNORMAL LOW (ref 150–400)
RBC: 2.95 MIL/uL — ABNORMAL LOW (ref 4.22–5.81)
RDW: 20.5 % — ABNORMAL HIGH (ref 11.5–15.5)
WBC: 5.2 10*3/uL (ref 4.0–10.5)

## 2017-05-11 LAB — GLUCOSE, CAPILLARY
GLUCOSE-CAPILLARY: 230 mg/dL — AB (ref 65–99)
GLUCOSE-CAPILLARY: 229 mg/dL — AB (ref 65–99)

## 2017-05-11 MED ORDER — SODIUM CHLORIDE 0.9 % IV SOLN
510.0000 mg | Freq: Once | INTRAVENOUS | Status: AC
  Administered 2017-05-11: 510 mg via INTRAVENOUS
  Filled 2017-05-11: qty 17

## 2017-05-11 MED ORDER — CITALOPRAM HYDROBROMIDE 40 MG PO TABS: 20.0000 mg | ORAL_TABLET | Freq: Every day | ORAL | Status: DC

## 2017-05-11 MED ORDER — ONDANSETRON HCL 4 MG/2ML IJ SOLN: 4.0000 mg | Freq: Once | INTRAMUSCULAR | Status: DC | PRN

## 2017-05-11 MED ORDER — PANTOPRAZOLE SODIUM 40 MG PO TBEC: 40.0000 mg | DELAYED_RELEASE_TABLET | Freq: Two times a day (BID) | ORAL | Status: DC

## 2017-05-11 NOTE — Discharge Summary (Signed)
Physician Discharge Summary  [name] IWP:[id number] DOB: [birth year] DOA: 05/08/2017  PCP: [name], MD  Admit date: 05/08/2017 Discharge date: 05/11/2017  Admitted From: Home Disposition: Home  Recommendations for Outpatient Follow-up:  1. Follow up with PCP in 1 week 2. Follow-up with gastroenterology 3. Please obtain BMP/CBC in one week 4. Please follow up on the following pending results: None  Home Health: None Equipment/Devices: None  Discharge Condition: Stable CODE STATUS: Full code Diet recommendation: Heart healthy   Brief/Interim Summary:  Admission HPI written by Waldemar Dickens, MD   Chief Complaint: abd pain and sob  HPI: [name] is a 76 y.o. male with medical history significant of diabetes, MI, insomnia, hyperlipidemia, GERD/GI bleed, hypertension, COPD, diverticulosis, AAA, colonic AVM, bipolar, CHF.   3-4 day history of generalized abdominal pain. Comes and goes. Associated with 2 weeks of shortness of breath which became progressively worse over the last 1-2 days. States that stools are completely black but non-tarry. Abdominal discomfort improves with ginger ale and Imodium. Patient states that on the day of admission he was in his bathroom attempting to have a bowel movement when the pain became unbearable and he called EMS. Patient reports being stable on his home O2 of 7 L. Reports decreased oral intake over the last 2-3 days due to pain. Denies cough, fevers, chest pain, palpitations, dysuria, frequency, flank pain, neck stiffness, headache, focal neurological deficits.  Last bowel movement 2 days ago which was reportedly within normal consistency but black   ED Course: 1 L normal saline bolus given due to hypotension, Zofran given.    Hospital course:  GI bleeding Melena Fecal occult blood testing positive. No hematochezia. Just melena. Received 2 units of PRBCs. Hemoglobin stable.Colonoscopy and EGD significant for  diverticulosis with no active source of bleeding. Likely source of bleeding is in the location unreachable by endoscopy. Patient received an iron infusion prior to discharge. Follow-up with GI as an outpatient. Per GI, expect this will be a recurring theme and with best be handled with repeat transfusions as needed.  Blood loss anemia Management above.  Acute kidney injury Resolved with IV fluids  Hyperkalemia Patient given supplementation.  Abdominal pain CT abdomen pelvis without acute abnormality. Resolved.  Hypotension Resolved with IV fluids.  Diabetes mellitus Continued Lantus and patient given sliding scale while inpatient. Resume home regimen as an outpatient.  COPD Continued baseline O2 and inhalers.  Chronic pain Continued Tramadol and Neurontin  Insomnia Continued Trazodone  Chronic diastolic heart failure Stable  Depression Anxiety Continued Celexa and Xanax  Hyperlipidemia Continued statin  Discharge Diagnoses:  Active Problems:   Hyperlipidemia   COPD (chronic obstructive pulmonary disease) (HCC)   Diastolic HF (heart failure) (HCC)   Chronic GI bleeding   Chronic hypoxemic respiratory failure (HCC)   Acute blood loss anemia   AVM (arteriovenous malformation) of colon   Insomnia   Gastric AVM   AVM (arteriovenous malformation) of duodenum, acquired with hemorrhage   GI bleed   AKI (acute kidney injury) ([place])   Diabetes mellitus with complication [facility])    Discharge Instructions  Discharge Instructions    Call MD for:  difficulty breathing, headache or visual disturbances    Complete by:  As directed    Call MD for:  persistant nausea and vomiting    Complete by:  As directed    Increase activity slowly    Complete by:  As directed      Allergies as of 05/11/2017  Reactions   Morphine And Related Shortness Of Breath, Nausea And Vomiting, Rash, Other (See Comments)   Reaction:  Hallucinations    Penicillins Anaphylaxis,  Hives, Other (See Comments)   Has patient had a PCN reaction causing immediate rash, facial/tongue/throat swelling, SOB or lightheadedness with hypotension: Yes Has patient had a PCN reaction causing severe rash involving mucus membranes or skin necrosis: No Has patient had a PCN reaction that required hospitalization No Has patient had a PCN reaction occurring within the last 10 years: No If all of the above answers are "NO", then may proceed with Cephalosporin use.   Zolpidem Shortness Of Breath   Demerol [meperidine] Other (See Comments)   Reaction:  Hallucinations     Dilaudid [hydromorphone Hcl] Other (See Comments)   Reaction:  Hallucinations    Levofloxacin Other (See Comments)   Reaction:  Unknown       Medication List    STOP taking these medications   lisinopril 2.5 MG tablet Commonly known as:  PRINIVIL,ZESTRIL   metoprolol tartrate 25 MG tablet Commonly known as:  LOPRESSOR   zolpidem 5 MG tablet Commonly known as:  AMBIEN     TAKE these medications   acetaminophen 325 MG tablet Commonly known as:  TYLENOL Take 650 mg by mouth every 6 (six) hours as needed for mild pain, fever or headache.   albuterol 108 (90 Base) MCG/ACT inhaler Commonly known as:  PROVENTIL HFA;VENTOLIN HFA Inhale 2 puffs into the lungs every 6 (six) hours as needed for wheezing or shortness of breath.   ALPRAZolam 0.25 MG tablet Commonly known as:  XANAX Take 0.5 mg by mouth 3 (three) times daily as needed for anxiety or sleep.   atorvastatin 40 MG tablet Commonly known as:  LIPITOR Take 40 mg by mouth at bedtime.   cholecalciferol 1000 units tablet Commonly known as:  VITAMIN D Take 1,000 Units by mouth daily.   citalopram 40 MG tablet Commonly known as:  CELEXA Take 0.5 tablets (20 mg total) by mouth daily. What changed:  how much to take   ferrous sulfate 325 (65 FE) MG tablet Take 1 tablet (325 mg total) by mouth 3 (three) times daily with meals.   gabapentin 300 MG  capsule Commonly known as:  NEURONTIN Take 300 mg by mouth 3 (three) times daily.   insulin aspart 100 UNIT/ML injection Commonly known as:  novoLOG Inject 10 Units into the skin 3 (three) times daily with meals as needed for high blood sugar.   insulin glargine 100 UNIT/ML injection Commonly known as:  LANTUS Inject 0.25 mLs (25 Units total) into the skin 2 (two) times daily.   metFORMIN 500 MG tablet Commonly known as:  GLUCOPHAGE Take 500 mg by mouth 2 (two) times daily with a meal.   pantoprazole 40 MG tablet Commonly known as:  PROTONIX Take 1 tablet (40 mg total) by mouth 2 (two) times daily. What changed:  when to take this  reasons to take this   SYMBICORT 160-4.5 MCG/ACT inhaler Generic drug:  budesonide-formoterol Inhale 2 puffs into the lungs 2 (two) times daily.   tiotropium 18 MCG inhalation capsule Commonly known as:  SPIRIVA Place 18 mcg into inhaler and inhale daily as needed (shortness of breath).   torsemide 20 MG tablet Commonly known as:  DEMADEX Take 1 tablet (20 mg total) by mouth 2 (two) times daily. What changed:  how much to take  when to take this   traMADol 50 MG tablet Commonly known as:  ULTRAM Take 50-100 mg by mouth every 12 (twelve) hours as needed for moderate pain.   traZODone 50 MG tablet Commonly known as:  DESYREL Take 50 mg by mouth at bedtime as needed for sleep.   vitamin B-12 1000 MCG tablet Commonly known as:  CYANOCOBALAMIN Take 1 tablet (1,000 mcg total) by mouth daily.      Follow-up Information    [name], MD. Schedule an appointment as soon as possible for a visit in 1 week(s).   Specialty:  Internal Medicine Contact information: [place], [place] [address] [phone]        Doran Stabler, MD. Schedule an appointment as soon as possible for a visit in 4 week(s).   Specialty:  Gastroenterology Contact information: [address] [place]  [address] [phone]          Allergies  Allergen Reactions  . Morphine And Related Shortness Of Breath, Nausea And Vomiting, Rash and Other (See Comments)    Reaction:  Hallucinations   . Penicillins Anaphylaxis, Hives and Other (See Comments)    Has patient had a PCN reaction causing immediate rash, facial/tongue/throat swelling, SOB or lightheadedness with hypotension: Yes Has patient had a PCN reaction causing severe rash involving mucus membranes or skin necrosis: No Has patient had a PCN reaction that required hospitalization No Has patient had a PCN reaction occurring within the last 10 years: No If all of the above answers are "NO", then may proceed with Cephalosporin use.  Marland Kitchen Zolpidem Shortness Of Breath  . Demerol [Meperidine] Other (See Comments)    Reaction:  Hallucinations    . Dilaudid [Hydromorphone Hcl] Other (See Comments)    Reaction:  Hallucinations   . Levofloxacin Other (See Comments)    Reaction:  Unknown     Consultations:  Gastroenterology   Procedures/Studies: Ct Angio Chest Pe W And/or Wo Contrast  Result Date: 05/08/2017 CLINICAL DATA:  Shortness of breath, abdominal pain, leukocytosis EXAM: CT ANGIOGRAPHY CHEST CT ABDOMEN AND PELVIS WITH CONTRAST TECHNIQUE: Multidetector CT imaging of the chest was performed using the standard protocol during bolus administration of intravenous contrast. Multiplanar CT image reconstructions and MIPs were obtained to evaluate the vascular anatomy. Multidetector CT imaging of the abdomen and pelvis was performed using the standard protocol during bolus administration of intravenous contrast. CONTRAST:  100 mL Isovue 370 COMPARISON:  CTA chest dated 07/01/2015. CT abdomen/ pelvis dated 04/22/2015. FINDINGS: CTA CHEST FINDINGS Cardiovascular: Suboptimal contrast opacification of the pulmonary arteries due to limited rate of injection (affecting contrast bolus), secondary to poor IV access. No evidence of central pulmonary  embolism to the lobar level. Segmental and subsegmental pulmonary emboli cannot be evaluated. No evidence of thoracic aortic aneurysm or dissection. Atherosclerotic calcifications of the aortic arch. The heart is top-normal in size.  No pericardial effusion. Coronary atherosclerosis the LAD and right coronary artery. Postsurgical changes related to prior CABG. Mediastinum/Nodes: No suspicious mediastinal lymphadenopathy. Visualized thyroid is unremarkable. Lungs/Pleura: Bullous cavity in the left lung apex. Mild dependent atelectasis in the bilateral lower lobes. No suspicious pulmonary nodules. No focal consolidation. No pleural effusion or pneumothorax. Musculoskeletal: Thoracic spine is within normal limits. Median sternotomy. Review of the MIP images confirms the above findings. CT ABDOMEN and PELVIS FINDINGS Hepatobiliary: Liver is within normal limits. Gallbladder is within normal limits. No intrahepatic or extrahepatic ductal dilatation. Pancreas: Within normal limits. Spleen: Within normal limits. Adrenals/Urinary Tract: Adrenal glands within normal limits. 9 mm right lower pole renal  cyst (series 14/ image 45). Segmental scarring/atrophy along the right lower pole. Left kidney is within normal limits. No hydronephrosis. Bladder is within normal limits. Stomach/Bowel: Stomach is notable for a large hiatal hernia/inverted intrathoracic stomach. No evidence of bowel obstruction. Status post appendectomy. Sigmoid diverticulosis, without evidence of diverticulitis. No colonic wall thickening or mass is evident on CT. Vascular/Lymphatic: 5.2 x 5.2 cm infrarenal abdominal aortic aneurysm (series 14/image 55) with indwelling aorto bi-iliac stent, previously 5 1 x 5.1 cm. No suspicious abdominopelvic lymphadenopathy. Reproductive: Prostate is unremarkable. Other: No abdominopelvic ascites. Small fat containing right inguinal hernia. Musculoskeletal: Mild degenerative changes of the lumbar spine. Review of the MIP  images confirms the above findings. IMPRESSION: Suboptimal contrast opacification of the pulmonary arteries. No evidence of central pulmonary embolism to the lobar level. Segmental and subsegmental pulmonary emboli cannot be evaluated. 5.2 cm infrarenal abdominal aortic aneurysm status post aorto bi-iliac stent, grossly unchanged. Sigmoid diverticulosis, without evidence of diverticulitis. Additional ancillary findings as above. Aortic Atherosclerosis (ICD10-I70.0) and Emphysema (ICD10-J43.9). Electronically Signed   By: Julian Hy M.D.   On: 05/08/2017 10:35   Ct Abdomen Pelvis W Contrast  Result Date: 05/08/2017 CLINICAL DATA:  Shortness of breath, abdominal pain, leukocytosis EXAM: CT ANGIOGRAPHY CHEST CT ABDOMEN AND PELVIS WITH CONTRAST TECHNIQUE: Multidetector CT imaging of the chest was performed using the standard protocol during bolus administration of intravenous contrast. Multiplanar CT image reconstructions and MIPs were obtained to evaluate the vascular anatomy. Multidetector CT imaging of the abdomen and pelvis was performed using the standard protocol during bolus administration of intravenous contrast. CONTRAST:  100 mL Isovue 370 COMPARISON:  CTA chest dated 07/01/2015. CT abdomen/ pelvis dated 04/22/2015. FINDINGS: CTA CHEST FINDINGS Cardiovascular: Suboptimal contrast opacification of the pulmonary arteries due to limited rate of injection (affecting contrast bolus), secondary to poor IV access. No evidence of central pulmonary embolism to the lobar level. Segmental and subsegmental pulmonary emboli cannot be evaluated. No evidence of thoracic aortic aneurysm or dissection. Atherosclerotic calcifications of the aortic arch. The heart is top-normal in size.  No pericardial effusion. Coronary atherosclerosis the LAD and right coronary artery. Postsurgical changes related to prior CABG. Mediastinum/Nodes: No suspicious mediastinal lymphadenopathy. Visualized thyroid is unremarkable.  Lungs/Pleura: Bullous cavity in the left lung apex. Mild dependent atelectasis in the bilateral lower lobes. No suspicious pulmonary nodules. No focal consolidation. No pleural effusion or pneumothorax. Musculoskeletal: Thoracic spine is within normal limits. Median sternotomy. Review of the MIP images confirms the above findings. CT ABDOMEN and PELVIS FINDINGS Hepatobiliary: Liver is within normal limits. Gallbladder is within normal limits. No intrahepatic or extrahepatic ductal dilatation. Pancreas: Within normal limits. Spleen: Within normal limits. Adrenals/Urinary Tract: Adrenal glands within normal limits. 9 mm right lower pole renal cyst (series 14/ image 45). Segmental scarring/atrophy along the right lower pole. Left kidney is within normal limits. No hydronephrosis. Bladder is within normal limits. Stomach/Bowel: Stomach is notable for a large hiatal hernia/inverted intrathoracic stomach. No evidence of bowel obstruction. Status post appendectomy. Sigmoid diverticulosis, without evidence of diverticulitis. No colonic wall thickening or mass is evident on CT. Vascular/Lymphatic: 5.2 x 5.2 cm infrarenal abdominal aortic aneurysm (series 14/image 55) with indwelling aorto bi-iliac stent, previously 5 1 x 5.1 cm. No suspicious abdominopelvic lymphadenopathy. Reproductive: Prostate is unremarkable. Other: No abdominopelvic ascites. Small fat containing right inguinal hernia. Musculoskeletal: Mild degenerative changes of the lumbar spine. Review of the MIP images confirms the above findings. IMPRESSION: Suboptimal contrast opacification of the pulmonary arteries. No evidence of central pulmonary  embolism to the lobar level. Segmental and subsegmental pulmonary emboli cannot be evaluated. 5.2 cm infrarenal abdominal aortic aneurysm status post aorto bi-iliac stent, grossly unchanged. Sigmoid diverticulosis, without evidence of diverticulitis. Additional ancillary findings as above. Aortic Atherosclerosis  (ICD10-I70.0) and Emphysema (ICD10-J43.9). Electronically Signed   By: Julian Hy M.D.   On: 05/08/2017 10:35   Dg Chest Portable 1 View  Result Date: 05/08/2017 CLINICAL DATA:  Shortness of breath EXAM: PORTABLE CHEST 1 VIEW COMPARISON:  Chest radiographs 04/03/2017 FINDINGS: Findings of prior CABG. There is cardiomegaly with bibasilar atelectasis. No pneumothorax or sizable pleural effusion. Hiatal hernia is less clearly visualized. No focal airspace consolidation or overt pulmonary edema. IMPRESSION: Cardiomegaly and bibasilar atelectasis. Electronically Signed   By: Ulyses Jarred M.D.   On: 05/08/2017 05:56       Colonoscopy (05/10/17)  Impression:               - Preparation of the colon was fair.                           - Diverticulosis in the left colon and in the right                            colon.                           - The examination was otherwise normal on direct                            and retroflexion views.                           - No specimens collected.  Recommendation:           - Patient has a contact number available for                            emergencies. The signs and symptoms of potential                            delayed complications were discussed with the                            patient. Return to normal activities tomorrow.                            Written discharge instructions were provided to the                            patient.                           - Resume previous diet.                           - Continue present medications.                           - No recommendation at this time regarding repeat  colonoscopy due to no evidence of mucosal or other                            abnormalities on today's exam.  EGD (05/10/17)  Impression:               - Large hiatal hernia.                           - Normal stomach.                           - Duodenal diverticulum.                            - Submucosal nodule found in the duodenum.                           - No specimens collected.                           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 This may even have been the case                            for the episode last month and AVMs were just not                            visualized on SB capsule study. He will have more                            episodes in the future, and conservative therapy                            with observation and blood product transfusion                            seems the most prudent course given the difficulty                            locaintg culprit lesions and his significantly                            increased sedation risk due to COPD.  Recommendation:           - Patient has a contact number available for                            emergencies. The signs and symptoms of potential                            delayed complications were discussed with the  patient. Return to normal activities tomorrow.                            Written discharge instructions were provided to the                            patient.                           - Resume previous diet.                           - Continue present medications.                           - Resume previous diet.                           - Continue present medications.   Subjective: She reports no dark stools overnight.  Discharge Exam: Vitals:   05/10/17 2234 05/11/17 0646  BP: 122/61 (!) 104/56  Pulse: (!) 46 86  Resp: 16 16  Temp: 98.1 F (36.7 C) 97.7 F (36.5 C)   Vitals:   05/10/17 1813 05/10/17 1931 05/10/17 2234 05/11/17 0646  BP: (!) 120/58  122/61 (!) 104/56  Pulse: 85  (!) 46 86  Resp: 20  16 16   Temp: 98 F (36.7 C)  98.1 F (36.7 C) 97.7 F (36.5 C)  TempSrc: Oral  Oral   SpO2: 98% 97% 95% 100%  Weight:      Height:        General: Pt is  alert, awake, not in acute distress Cardiovascular: RRR, S1/S2 +, no rubs, no gallops Respiratory: CTA bilaterally, no wheezing, no rhonchi Abdominal: Soft, NT, Distended, bowel sounds + Extremities: no edema, no cyanosis    The results of significant diagnostics from this hospitalization (including imaging, microbiology, ancillary and laboratory) are listed below for reference.     Microbiology: No results found for this or any previous visit (from the past 240 hour(s)).   Labs: BNP (last 3 results)  Recent Labs  10/09/16 0625 10/20/16 2142 04/03/17 1201  BNP 58.0 131.0* 09.2   Basic Metabolic Panel:  Recent Labs Lab 05/08/17 0559 05/09/17 0504 05/10/17 0503  NA 137 137 141  K 5.3* 4.5 3.9  CL 96* 100* 103  CO2 32 30 28  GLUCOSE 263* 149* 134*  BUN 36* 28* 14  CREATININE 1.40* 1.28* 1.02  CALCIUM 8.7* 8.4* 8.2*   Liver Function Tests:  Recent Labs Lab 05/08/17 0559  AST 17  ALT 12*  ALKPHOS 112  BILITOT 0.5  PROT 5.7*  ALBUMIN 3.1*    Recent Labs Lab 05/08/17 0559  LIPASE 39   No results for input(s): AMMONIA in the last 168 hours. CBC:  Recent Labs Lab 05/08/17 0559 05/08/17 1233 05/09/17 0504 05/10/17 0503 05/11/17 0343  WBC 14.5* 8.4 8.3 6.2 5.2  NEUTROABS 11.1*  --   --   --   --   HGB 8.7* 7.6* 9.0* 9.0* 8.5*  HCT 29.3* 24.9* 29.0* 29.0* 27.9*  MCV 96.1 95.0 92.1 94.5 94.6  PLT 227 127* 145* 148* 148*   Cardiac Enzymes: No results for input(s): CKTOTAL, CKMB, CKMBINDEX, TROPONINI in the last 168 hours. BNP: Invalid input(s): POCBNP  CBG:  Recent Labs Lab 05/10/17 1155 05/10/17 1810 05/10/17 2232 05/11/17 0745 05/11/17 1203  GLUCAP 136* 130* 178* 230* 229*   D-Dimer No results for input(s): DDIMER in the last 72 hours. Hgb A1c No results for input(s): HGBA1C in the last 72 hours. Lipid Profile No results for input(s): CHOL, HDL, LDLCALC, TRIG, CHOLHDL, LDLDIRECT in the last 72 hours. Thyroid function studies No  results for input(s): TSH, T4TOTAL, T3FREE, THYROIDAB in the last 72 hours.  Invalid input(s): FREET3 Anemia work up No results for input(s): VITAMINB12, FOLATE, FERRITIN, TIBC, IRON, RETICCTPCT in the last 72 hours. Urinalysis    Component Value Date/Time   COLORURINE YELLOW 05/08/2017 0614   APPEARANCEUR CLEAR 05/08/2017 0614   LABSPEC 1.015 05/08/2017 0614   PHURINE 5.0 05/08/2017 0614   GLUCOSEU NEGATIVE 05/08/2017 0614   HGBUR NEGATIVE 05/08/2017 0614   BILIRUBINUR NEGATIVE 05/08/2017 0614   BILIRUBINUR small 02/13/2015 1354   KETONESUR NEGATIVE 05/08/2017 0614   PROTEINUR NEGATIVE 05/08/2017 0614   UROBILINOGEN 0.2 02/13/2015 1354   UROBILINOGEN 0.2 08/24/2014 1948   NITRITE NEGATIVE 05/08/2017 0614   LEUKOCYTESUR NEGATIVE 05/08/2017 0375    Time coordinating discharge: Over 30 minutes  SIGNED:   Cordelia Poche, MD [facility] 05/11/2017, 1:55 PM Pager [phone]  If 7PM-7AM, please contact night-coverage [email or web address] Password [email or web address]

## 2017-05-11 NOTE — Progress Notes (Signed)
Patient discharge teaching given, including activity, diet, follow-up appoints, and medications. Patient verbalized understanding of all discharge instructions. IV access was d/c'd. Vitals are stable. Skin is intact except as charted in most recent assessments. Pt to be escorted out by NT, to be driven home by family. 

## 2017-05-11 NOTE — Progress Notes (Signed)
     [facility] Progress Note  Chief Complaint:    GI bleed/melena  Subjective: No bleeding / no BMs Denies abd pain, CP or change in his chronic dyspnea.  Objective:  Vital signs in last 24 hours: Temp:  [97.6 F (36.4 C)-98.1 F (36.7 C)] 97.7 F (36.5 C) (07/26 0646) Pulse Rate:  [43-86] 86 (07/26 0646) Resp:  [12-23] 16 (07/26 0646) BP: (104-135)/(56-82) 104/56 (07/26 0646) SpO2:  [90 %-100 %] 100 % (07/26 0646) Weight:  [255 lb (115.7 kg)] 255 lb (115.7 kg) (07/25 1500) Last BM Date: 05/10/17 General:   Alert, white male in NAD EENT:  Normal hearing, non icteric sclera, conjunctive pink.  Heart:  Regular rate and rhythm; no murmurs. no lower extremity edema Pulm: Normal respiratory effort on 02 Abdomen:  Soft, nondistended, nontender.  Normal bowel sounds, no masses felt.  Neurologic:  Alert and  oriented x4;  grossly normal neurologically. Psych:  Pleasant, cooperative.  Normal mood and affect.   Intake/Output from previous day: 07/25 0701 - 07/26 0700 In: 1449.8 [P.O.:840; I.V.:609.8] Out: 1100 [Urine:1100] Intake/Output this shift: No intake/output data recorded.  Lab Results:  Recent Labs  05/09/17 0504 05/10/17 0503 05/11/17 0343  WBC 8.3 6.2 5.2  HGB 9.0* 9.0* 8.5*  HCT 29.0* 29.0* 27.9*  PLT 145* 148* 148*   BMET  Recent Labs  05/09/17 0504 05/10/17 0503  NA 137 141  K 4.5 3.9  CL 100* 103  CO2 30 28  GLUCOSE 149* 134*  BUN 28* 14  CREATININE 1.28* 1.02  CALCIUM 8.4* 8.2*   No results found.  ASSESSMENT / PLAN:   Acute on chronic anemia / GI bleed. 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 Colonoscopy yesterday was unremarkable. EGD remarkable for large hiatal hernia, no associated [place] lesions and no AVMs.  -Expect he will continue to have more episodes in the future.  Observation / supportive care with blood transfusions preferred given the difficultly of reaching the culprit lesions and his significantly  increased sedation risk due to COPD. -Hgb still low at 8.5 (post 2 units of blood on 7/24) but overall stable. He hasn't been up but at least lying in bed feels okay. Breathing at baseline -He could benefit from iron infusion prior to discharge.    Active Problems:   Hyperlipidemia   COPD (chronic obstructive pulmonary disease) (HCC)   Diastolic HF (heart failure) (HCC)   Chronic GI bleeding   Chronic hypoxemic respiratory failure (HCC)   Acute blood loss anemia   AVM (arteriovenous malformation) of colon   Insomnia   Gastric AVM   AVM (arteriovenous malformation) of duodenum, acquired with hemorrhage   GI bleed   AKI (acute kidney injury) ([place])   Diabetes mellitus with complication ([place])    LOS: 2 days   Tye Savoy ,NP 05/11/2017, 9:06 AM  Pager number [phone]  I have discussed the case with the PA, and that is the plan I formulated. I personally interviewed and examined the patient.  His bleeding has stopped, Hgb stable. Getting an IV iron treatment now.  See my procedure reports from yesterday - this will be an ongoing issue.  He can go home anytime from my perspective, pending [facility] approval.  Call as need arises.     Nelida Meuse III Pager [phone]  Mon-Fri 8a-5p [phone] after 5p, weekends, holidays

## 2017-05-11 NOTE — Progress Notes (Signed)
PT Cancellation Note  Patient Details Name: JACOBS GOLAB MRN: [id number] DOB: 1941-05-13   Cancelled Treatment:    Reason Eval/Treat Not Completed: PT screened, no needs identified, will sign off. Per RN, pt is independent and doesn't need PT evaluation. Please re-order if any new needs arise.    Scheryl Marten PT, DPT  [phone]  05/11/2017, 4:08 PM

## 2017-05-13 IMAGING — DX DG CHEST 1V PORT
1 series · 2 of 2 positions shown · non-contrast
Comparison: Portable exam 0524 hours compared to 07/24/2016

CLINICAL DATA: Weakness, shortness of breath, and GI bleeding for 2
weeks, history COPD, abdominal aortic aneurysm, coronary artery
disease, hypertension, type II diabetes mellitus, former smoker

EXAM:
PORTABLE CHEST 1 VIEW

[Series 1: chest ap · 0.14mm/px · 2 of 2 slices shown]
[im 1/2]
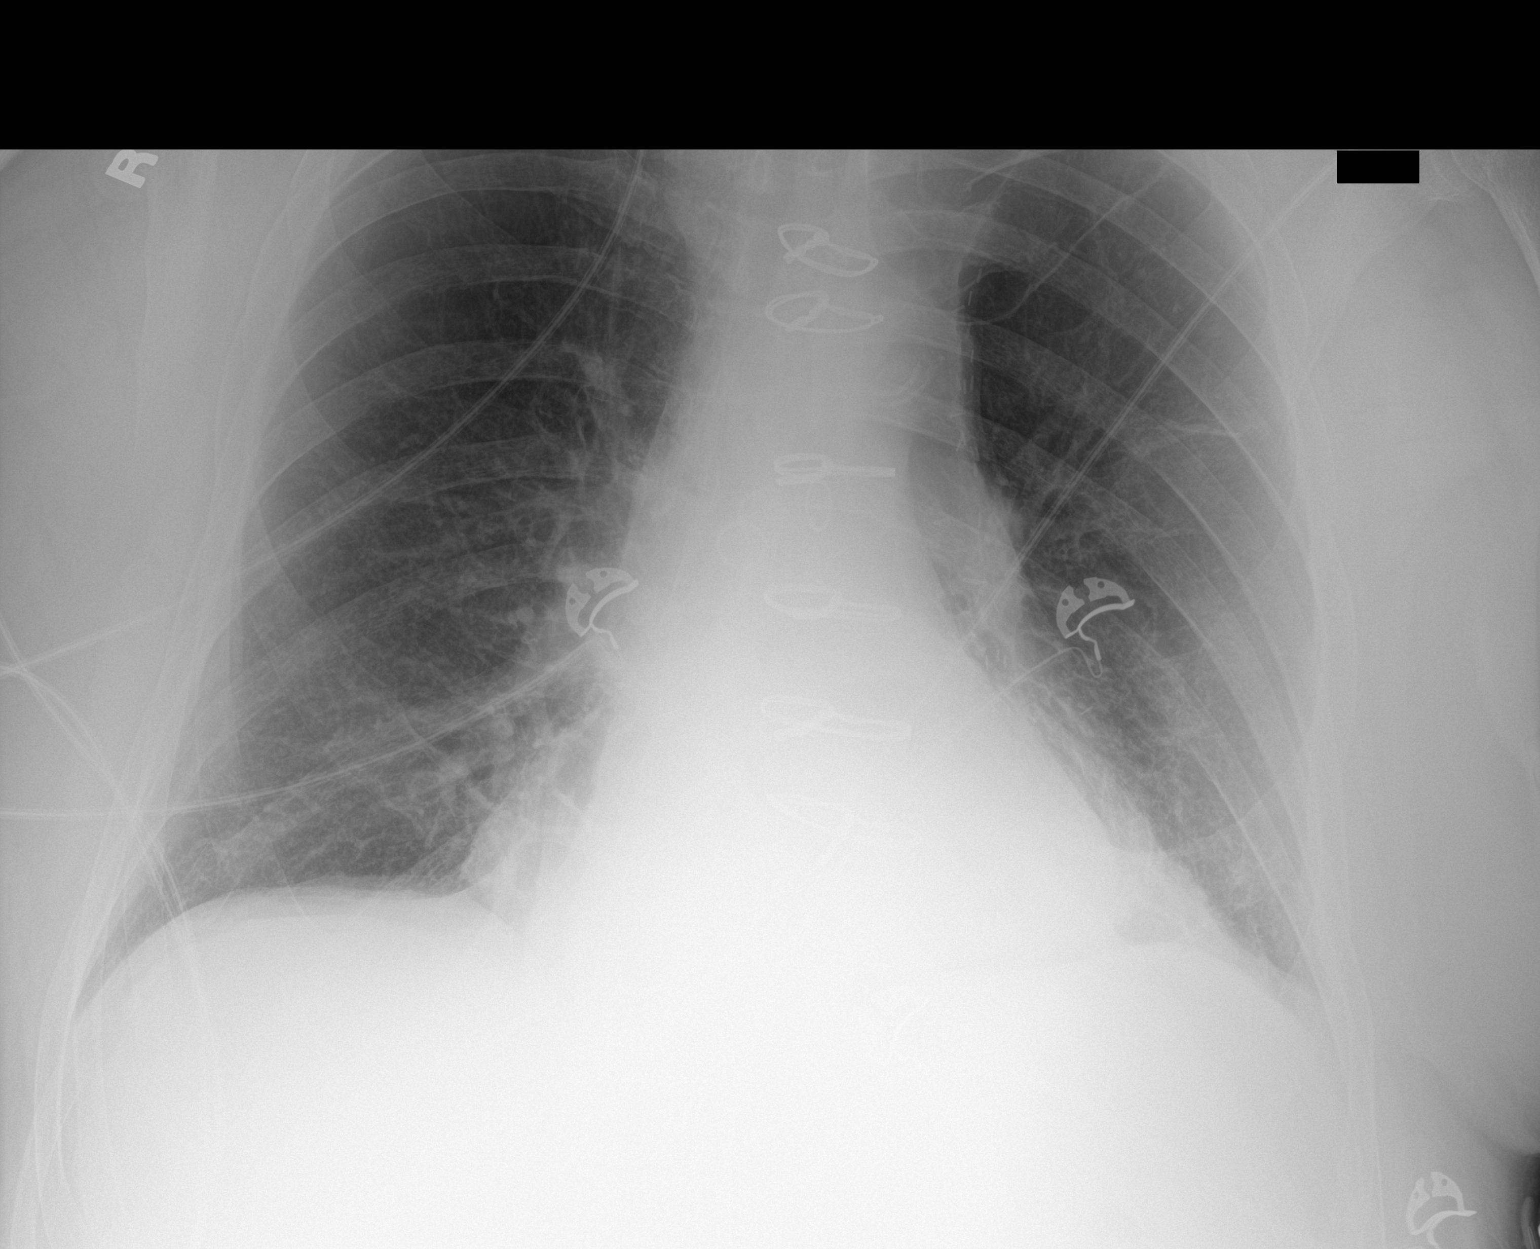
[im 2/2]
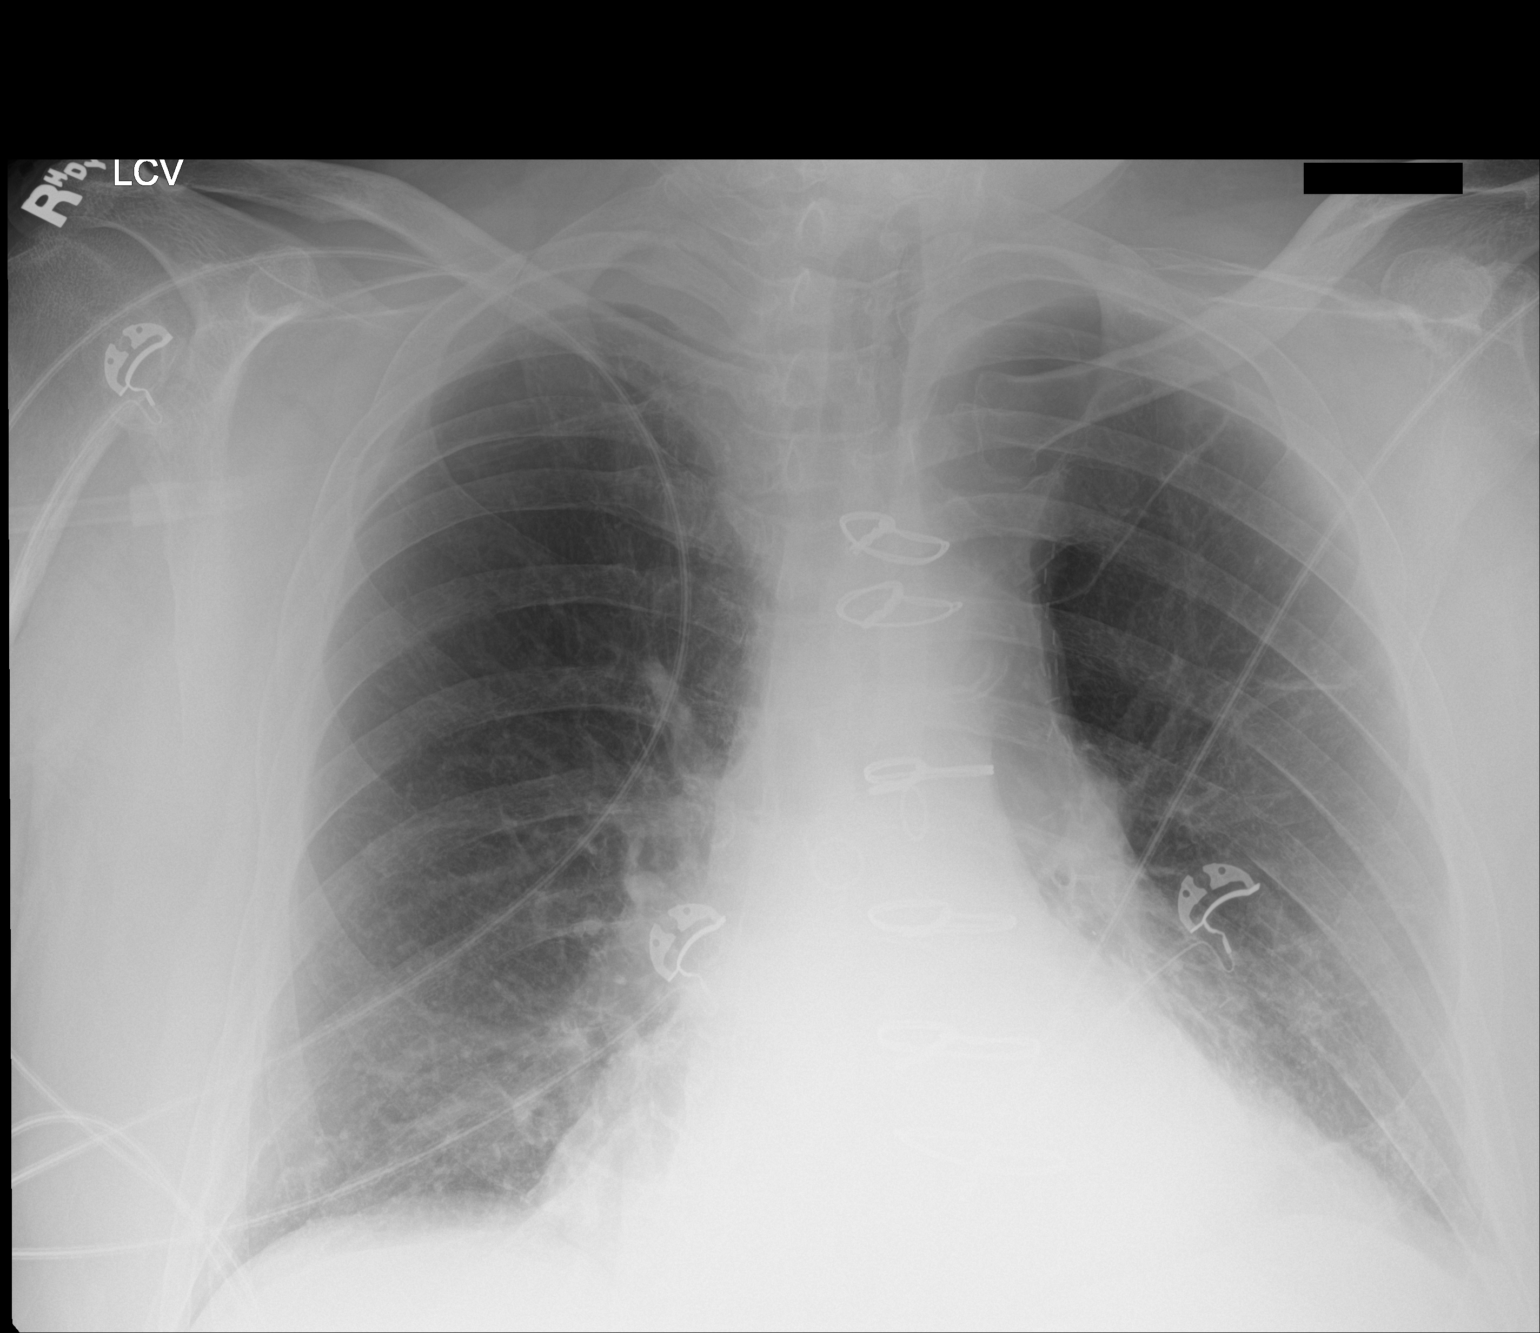

[2 of 2 positions shown; findings below may reference images not displayed]

FINDINGS: Upper normal heart size post CABG.

Atherosclerotic calcification aorta.

Hiatal hernia, noted on prior CT from 2402.

Mediastinal contours and pulmonary vascularity otherwise normal.

Stable pleural thickening at LEFT apex.

Underlying emphysematous changes with chronic atelectasis versus
consolidation in LEFT lower lobe.

Linear scarring LEFT upper lobe.

No definite pleural effusion or pneumothorax.

Bones demineralized.
IMPRESSION: Changes of COPD with LEFT upper lobe scarring and chronic
atelectasis versus consolidation in LEFT lower lobe.

Hiatal hernia.

Post CABG.

## 2017-05-17 ENCOUNTER — Telehealth: Payer: Self-pay | Admitting: Gastroenterology

## 2017-05-17 DIAGNOSIS — K922 Gastrointestinal hemorrhage, unspecified: Secondary | ICD-10-CM

## 2017-05-17 NOTE — Telephone Encounter (Signed)
He needs frequent CBC soon, ongoing CBCs and iron replacement mgmt all with his PCP.

## 2017-05-17 NOTE — Telephone Encounter (Signed)
I left a detailed message for the patient to come for labs at his convenience 

## 2017-05-17 NOTE — Telephone Encounter (Signed)
Dr,. Fuller Plan.  Does he need follow up labs from [place] or a follow up appt?

## 2017-05-17 NOTE — Addendum Note (Signed)
Addended by: Marlon Pel on: 05/17/2017 11:35 AM   Modules accepted: Orders

## 2017-05-24 IMAGING — DX DG CHEST 1V PORT
1 series · 1 of 1 positions shown · non-contrast
Comparison: Radiographs October 09, 2016.

CLINICAL DATA: Shortness of breath, chest pain.

EXAM:
PORTABLE CHEST 1 VIEW

[chest ap]
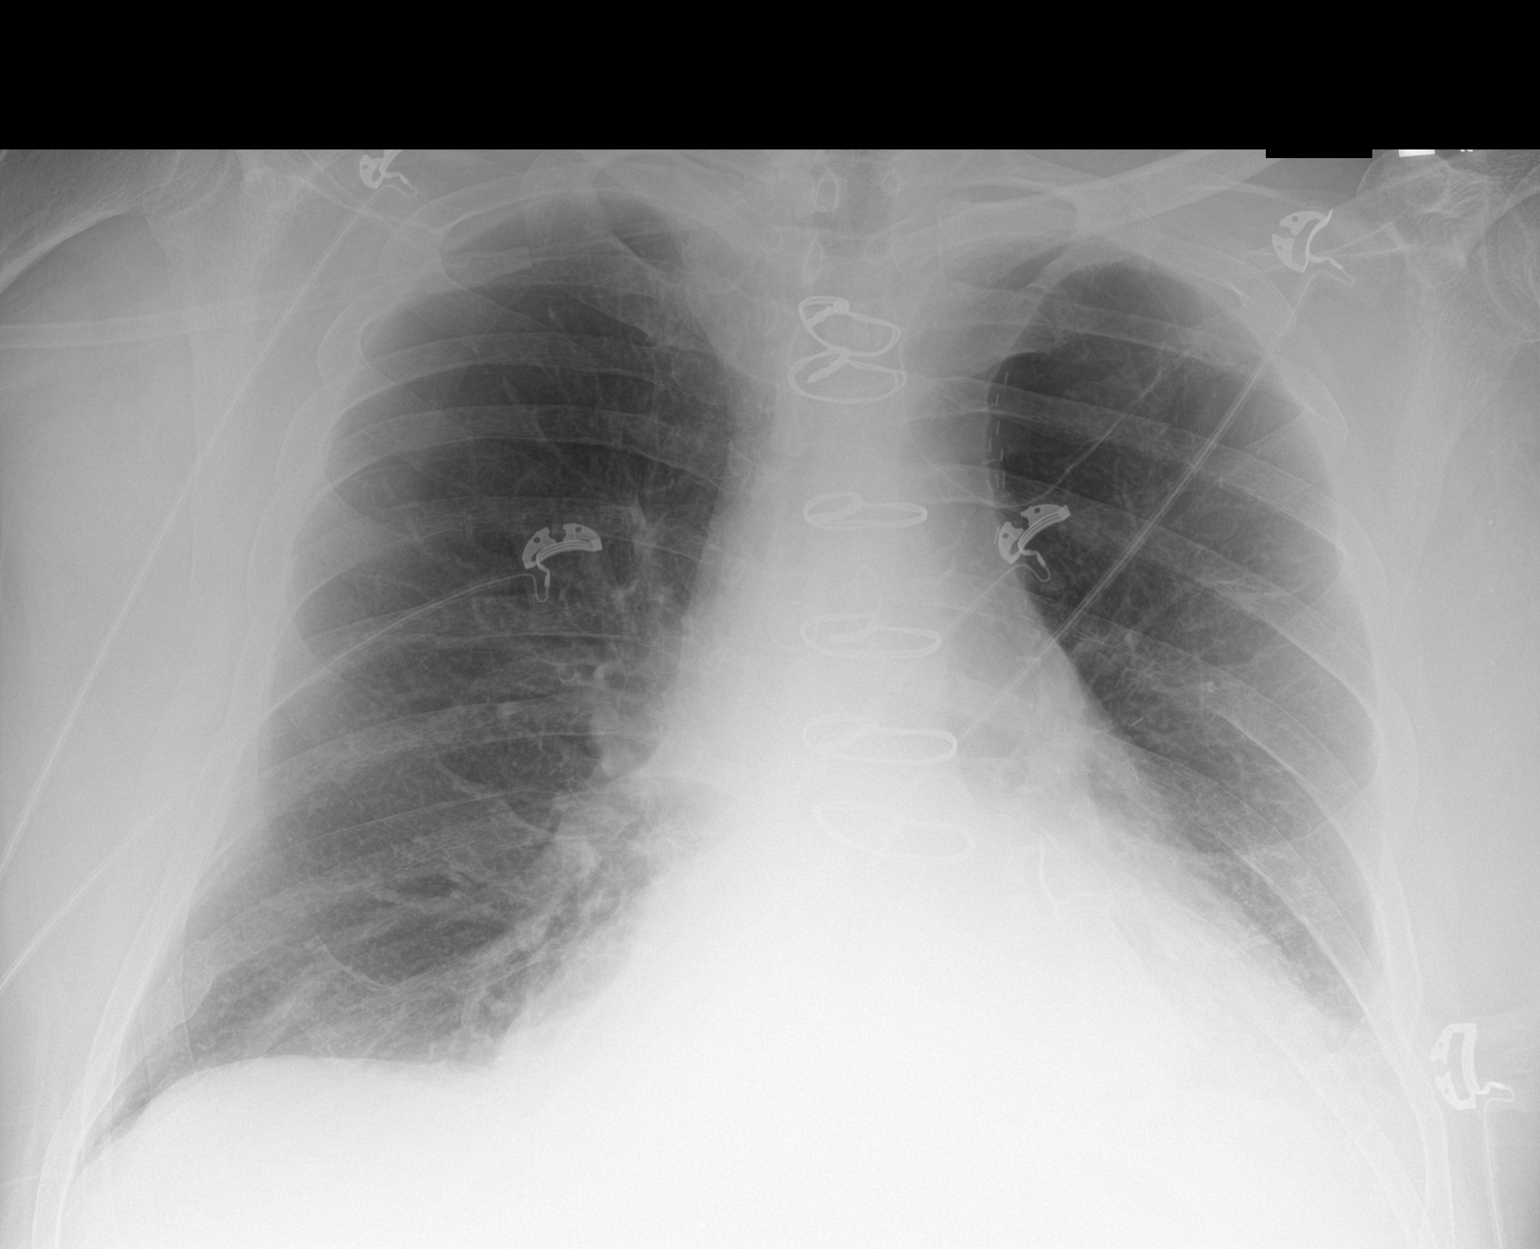

[1 of 1 positions shown; findings below may reference images not displayed]

FINDINGS: Stable cardiomegaly. Status post coronary artery bypass graft. No
pneumothorax is noted. Right lung is clear. Stable left basilar
opacity is noted concerning for atelectasis or infiltrate with
possible associated pleural effusion. Bony thorax is unremarkable.
IMPRESSION: Stable left basilar opacity as described above.

## 2017-06-11 IMAGING — DX DG CHEST 1V PORT
1 series · 1 of 1 positions shown · non-contrast
Comparison: Chest radiograph dated 10/20/2016 and chest CT dated
07/01/2015

CLINICAL DATA: 75-year-old male with shortness of breath.

EXAM:
PORTABLE CHEST 1 VIEW

[chest ap]
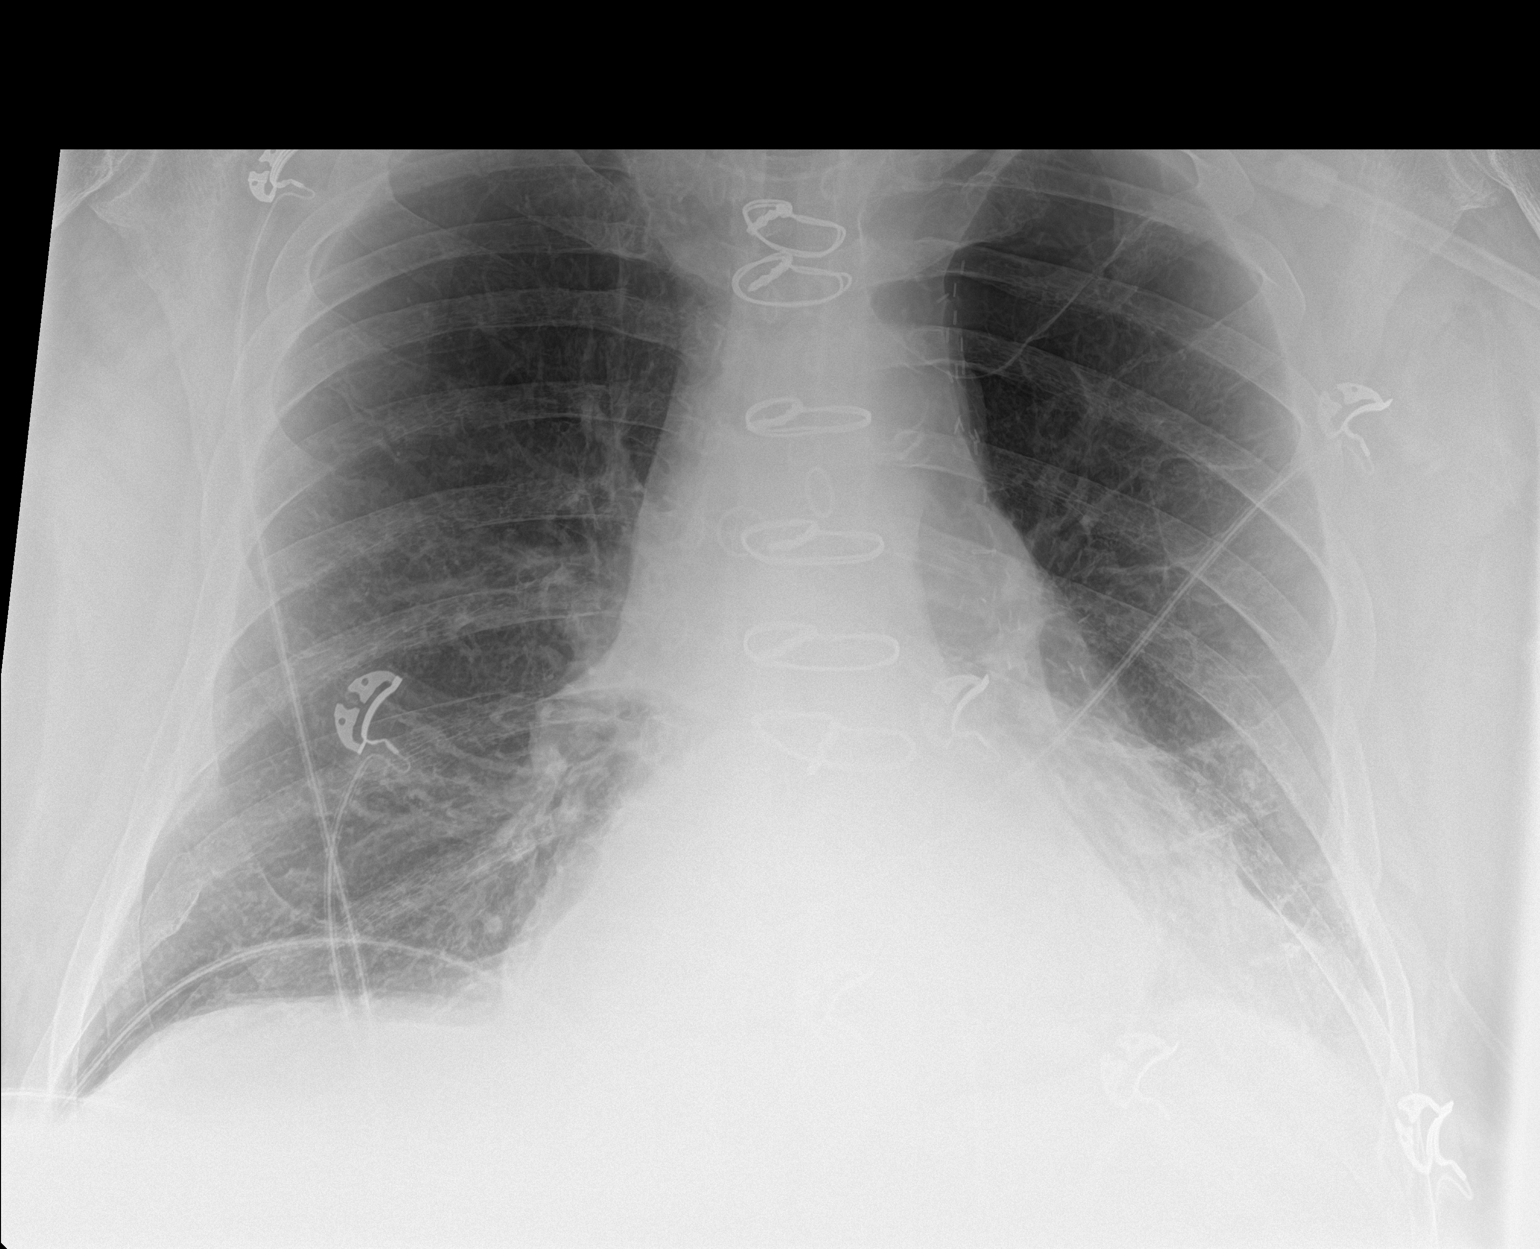

[1 of 1 positions shown; findings below may reference images not displayed]

FINDINGS: Stable left basilar density, likely atelectatic changes/ scarring.
No new consolidative changes. There is no pleural effusion or
pneumothorax. There is emphysematous changes of the lungs with
postsurgical changes versus bulla in the left upper lobe. The
cardiac silhouette is within normal limits. Median sternotomy wires
and mediastinal and CABG surgical clips noted. Retrocardiac density
corresponds to the hiatal hernia seen on the CT. Old healed right
posterior rib fractures noted. No acute osseous pathology.
IMPRESSION: No acute cardiopulmonary process.

## 2017-06-28 ENCOUNTER — Emergency Department (HOSPITAL_COMMUNITY): Payer: Medicare HMO

## 2017-06-28 ENCOUNTER — Observation Stay (HOSPITAL_BASED_OUTPATIENT_CLINIC_OR_DEPARTMENT_OTHER): Payer: Medicare HMO

## 2017-06-28 ENCOUNTER — Observation Stay (HOSPITAL_COMMUNITY)
Admission: EM | Admit: 2017-06-28 | Discharge: 2017-06-30 | Disposition: A | Discharge status: Home / Self Care | Payer: Medicare HMO | Attending: Internal Medicine | Admitting: Internal Medicine

## 2017-06-28 ENCOUNTER — Encounter (HOSPITAL_COMMUNITY): Payer: Self-pay | Admitting: Emergency Medicine

## 2017-06-28 ENCOUNTER — Observation Stay (HOSPITAL_COMMUNITY): Payer: Medicare HMO

## 2017-06-28 DIAGNOSIS — I509 Heart failure, unspecified: Secondary | ICD-10-CM

## 2017-06-28 DIAGNOSIS — J439 Emphysema, unspecified: Secondary | ICD-10-CM | POA: Insufficient documentation

## 2017-06-28 DIAGNOSIS — I5032 Chronic diastolic (congestive) heart failure: Secondary | ICD-10-CM | POA: Insufficient documentation

## 2017-06-28 DIAGNOSIS — K219 Gastro-esophageal reflux disease without esophagitis: Secondary | ICD-10-CM | POA: Insufficient documentation

## 2017-06-28 DIAGNOSIS — N189 Chronic kidney disease, unspecified: Secondary | ICD-10-CM

## 2017-06-28 DIAGNOSIS — I714 Abdominal aortic aneurysm, without rupture: Secondary | ICD-10-CM | POA: Diagnosis not present

## 2017-06-28 DIAGNOSIS — E119 Type 2 diabetes mellitus without complications: Secondary | ICD-10-CM | POA: Diagnosis present

## 2017-06-28 DIAGNOSIS — N182 Chronic kidney disease, stage 2 (mild): Secondary | ICD-10-CM | POA: Diagnosis not present

## 2017-06-28 DIAGNOSIS — D500 Iron deficiency anemia secondary to blood loss (chronic): Secondary | ICD-10-CM | POA: Diagnosis not present

## 2017-06-28 DIAGNOSIS — E1122 Type 2 diabetes mellitus with diabetic chronic kidney disease: Secondary | ICD-10-CM | POA: Insufficient documentation

## 2017-06-28 DIAGNOSIS — F3162 Bipolar disorder, current episode mixed, moderate: Secondary | ICD-10-CM | POA: Insufficient documentation

## 2017-06-28 DIAGNOSIS — Z794 Long term (current) use of insulin: Secondary | ICD-10-CM | POA: Insufficient documentation

## 2017-06-28 DIAGNOSIS — Z6830 Body mass index (BMI) 30.0-30.9, adult: Secondary | ICD-10-CM | POA: Insufficient documentation

## 2017-06-28 DIAGNOSIS — R000 Tachycardia, unspecified: Secondary | ICD-10-CM | POA: Diagnosis not present

## 2017-06-28 DIAGNOSIS — Z7951 Long term (current) use of inhaled steroids: Secondary | ICD-10-CM | POA: Diagnosis not present

## 2017-06-28 DIAGNOSIS — R1084 Generalized abdominal pain: Secondary | ICD-10-CM

## 2017-06-28 DIAGNOSIS — Z87891 Personal history of nicotine dependence: Secondary | ICD-10-CM | POA: Diagnosis not present

## 2017-06-28 DIAGNOSIS — K31819 Angiodysplasia of stomach and duodenum without bleeding: Secondary | ICD-10-CM

## 2017-06-28 DIAGNOSIS — R109 Unspecified abdominal pain: Principal | ICD-10-CM | POA: Insufficient documentation

## 2017-06-28 DIAGNOSIS — I10 Essential (primary) hypertension: Secondary | ICD-10-CM

## 2017-06-28 DIAGNOSIS — F411 Generalized anxiety disorder: Secondary | ICD-10-CM | POA: Insufficient documentation

## 2017-06-28 DIAGNOSIS — I2510 Atherosclerotic heart disease of native coronary artery without angina pectoris: Secondary | ICD-10-CM | POA: Diagnosis not present

## 2017-06-28 DIAGNOSIS — E785 Hyperlipidemia, unspecified: Secondary | ICD-10-CM | POA: Insufficient documentation

## 2017-06-28 DIAGNOSIS — D631 Anemia in chronic kidney disease: Secondary | ICD-10-CM

## 2017-06-28 DIAGNOSIS — Z9981 Dependence on supplemental oxygen: Secondary | ICD-10-CM | POA: Insufficient documentation

## 2017-06-28 DIAGNOSIS — J449 Chronic obstructive pulmonary disease, unspecified: Secondary | ICD-10-CM | POA: Diagnosis present

## 2017-06-28 DIAGNOSIS — Z79899 Other long term (current) drug therapy: Secondary | ICD-10-CM | POA: Insufficient documentation

## 2017-06-28 DIAGNOSIS — G4700 Insomnia, unspecified: Secondary | ICD-10-CM | POA: Insufficient documentation

## 2017-06-28 DIAGNOSIS — K922 Gastrointestinal hemorrhage, unspecified: Secondary | ICD-10-CM

## 2017-06-28 DIAGNOSIS — R197 Diarrhea, unspecified: Secondary | ICD-10-CM | POA: Diagnosis not present

## 2017-06-28 DIAGNOSIS — E1140 Type 2 diabetes mellitus with diabetic neuropathy, unspecified: Secondary | ICD-10-CM | POA: Insufficient documentation

## 2017-06-28 DIAGNOSIS — Z951 Presence of aortocoronary bypass graft: Secondary | ICD-10-CM | POA: Insufficient documentation

## 2017-06-28 DIAGNOSIS — N179 Acute kidney failure, unspecified: Secondary | ICD-10-CM | POA: Diagnosis not present

## 2017-06-28 DIAGNOSIS — E118 Type 2 diabetes mellitus with unspecified complications: Secondary | ICD-10-CM | POA: Diagnosis present

## 2017-06-28 DIAGNOSIS — I4510 Unspecified right bundle-branch block: Secondary | ICD-10-CM | POA: Insufficient documentation

## 2017-06-28 DIAGNOSIS — I252 Old myocardial infarction: Secondary | ICD-10-CM | POA: Diagnosis not present

## 2017-06-28 DIAGNOSIS — I130 Hypertensive heart and chronic kidney disease with heart failure and stage 1 through stage 4 chronic kidney disease, or unspecified chronic kidney disease: Secondary | ICD-10-CM | POA: Diagnosis not present

## 2017-06-28 DIAGNOSIS — E559 Vitamin D deficiency, unspecified: Secondary | ICD-10-CM | POA: Insufficient documentation

## 2017-06-28 DIAGNOSIS — R0789 Other chest pain: Secondary | ICD-10-CM | POA: Diagnosis not present

## 2017-06-28 DIAGNOSIS — I700 Atherosclerosis of aorta: Secondary | ICD-10-CM | POA: Insufficient documentation

## 2017-06-28 HISTORY — DX: Other specified anxiety disorders: F41.8

## 2017-06-28 LAB — FERRITIN: FERRITIN: 48 ng/mL (ref 24–336)

## 2017-06-28 LAB — CBC WITH DIFFERENTIAL/PLATELET
BAND NEUTROPHILS: 10 %
BASOS PCT: 0 %
Basophils Absolute: 0 10*3/uL (ref 0.0–0.1)
Basophils Absolute: 0 10*3/uL (ref 0.0–0.1)
Basophils Relative: 0 %
Blasts: 0 %
EOS ABS: 0.3 10*3/uL (ref 0.0–0.7)
EOS ABS: 0.2 10*3/uL (ref 0.0–0.7)
EOS PCT: 2 %
Eosinophils Relative: 2 %
HCT: 38.7 % — ABNORMAL LOW (ref 39.0–52.0)
HCT: 36.2 % — ABNORMAL LOW (ref 39.0–52.0)
HEMOGLOBIN: 11.3 g/dL — AB (ref 13.0–17.0)
Hemoglobin: 12.4 g/dL — ABNORMAL LOW (ref 13.0–17.0)
LYMPHS ABS: 1.7 10*3/uL (ref 0.7–4.0)
LYMPHS ABS: 1.1 10*3/uL (ref 0.7–4.0)
LYMPHS PCT: 11 %
Lymphocytes Relative: 8 %
MCH: 30 pg (ref 26.0–34.0)
MCH: 29.6 pg (ref 26.0–34.0)
MCHC: 32 g/dL (ref 30.0–36.0)
MCHC: 31.2 g/dL (ref 30.0–36.0)
MCV: 93.7 fL (ref 78.0–100.0)
MCV: 94.8 fL (ref 78.0–100.0)
MONO ABS: 0.3 10*3/uL (ref 0.1–1.0)
MONO ABS: 1.2 10*3/uL — AB (ref 0.1–1.0)
MONOS PCT: 2 %
Metamyelocytes Relative: 1 %
Monocytes Relative: 8 %
Myelocytes: 0 %
NEUTROS ABS: 13.1 10*3/uL — AB (ref 1.7–7.7)
NEUTROS PCT: 74 %
NRBC: 0 /100{WBCs}
Neutro Abs: 11.4 10*3/uL — ABNORMAL HIGH (ref 1.7–7.7)
Neutrophils Relative %: 82 %
OTHER: 0 %
PLATELETS: 201 10*3/uL (ref 150–400)
PROMYELOCYTES ABS: 0 %
Platelets: 259 10*3/uL (ref 150–400)
RBC: 4.13 MIL/uL — ABNORMAL LOW (ref 4.22–5.81)
RBC: 3.82 MIL/uL — ABNORMAL LOW (ref 4.22–5.81)
RDW: 14.2 % (ref 11.5–15.5)
RDW: 14.1 % (ref 11.5–15.5)
WBC: 15.4 10*3/uL — ABNORMAL HIGH (ref 4.0–10.5)
WBC: 13.9 10*3/uL — ABNORMAL HIGH (ref 4.0–10.5)

## 2017-06-28 LAB — ECHOCARDIOGRAM COMPLETE
Height: 74.5 in
WEIGHTICAEL: 4192 [oz_av]

## 2017-06-28 LAB — LIPID PANEL
CHOL/HDL RATIO: 4.5 ratio
CHOLESTEROL: 127 mg/dL (ref 0–200)
HDL: 28 mg/dL — ABNORMAL LOW (ref >40)
LDL Cholesterol: 67 mg/dL (ref 0–99)
Triglycerides: 160 mg/dL — ABNORMAL HIGH (ref <150)
VLDL: 32 mg/dL (ref 0–40)

## 2017-06-28 LAB — COMPREHENSIVE METABOLIC PANEL
ALT: 10 U/L — AB (ref 17–63)
ANION GAP: 10 (ref 5–15)
AST: 14 U/L — ABNORMAL LOW (ref 15–41)
Albumin: 3.5 g/dL (ref 3.5–5.0)
Alkaline Phosphatase: 140 U/L — ABNORMAL HIGH (ref 38–126)
BUN: 18 mg/dL (ref 6–20)
CHLORIDE: 99 mmol/L — AB (ref 101–111)
CO2: 27 mmol/L (ref 22–32)
CREATININE: 1.26 mg/dL — AB (ref 0.61–1.24)
Calcium: 8.7 mg/dL — ABNORMAL LOW (ref 8.9–10.3)
GFR, EST NON AFRICAN AMERICAN: 54 mL/min — AB (ref >60)
Glucose, Bld: 207 mg/dL — ABNORMAL HIGH (ref 65–99)
Potassium: 4.2 mmol/L (ref 3.5–5.1)
SODIUM: 136 mmol/L (ref 135–145)
TOTAL PROTEIN: 6.7 g/dL (ref 6.5–8.1)
Total Bilirubin: 0.3 mg/dL (ref 0.3–1.2)

## 2017-06-28 LAB — GLUCOSE, CAPILLARY
Glucose-Capillary: 181 mg/dL — ABNORMAL HIGH (ref 65–99)
Glucose-Capillary: 180 mg/dL — ABNORMAL HIGH (ref 65–99)

## 2017-06-28 LAB — IRON AND TIBC
Iron: 25 ug/dL — ABNORMAL LOW (ref 45–182)
SATURATION RATIOS: 7 % — AB (ref 17.9–39.5)
TIBC: 347 ug/dL (ref 250–450)
UIBC: 322 ug/dL

## 2017-06-28 LAB — C DIFFICILE QUICK SCREEN W PCR REFLEX
C DIFFICILE (CDIFF) INTERP: NOT DETECTED
C Diff antigen: NEGATIVE
C Diff toxin: NEGATIVE

## 2017-06-28 LAB — RETICULOCYTES
RBC.: 3.69 MIL/uL — AB (ref 4.22–5.81)
RETIC CT PCT: 3.7 % — AB (ref 0.4–3.1)
Retic Count, Absolute: 136.5 10*3/uL (ref 19.0–186.0)

## 2017-06-28 LAB — I-STAT CG4 LACTIC ACID, ED: LACTIC ACID, VENOUS: 2.3 mmol/L — AB (ref 0.5–1.9)

## 2017-06-28 LAB — PROTIME-INR
INR: 1
Prothrombin Time: 13.1 seconds (ref 11.4–15.2)

## 2017-06-28 LAB — CBC
HCT: 35.4 % — ABNORMAL LOW (ref 39.0–52.0)
Hemoglobin: 10.8 g/dL — ABNORMAL LOW (ref 13.0–17.0)
MCH: 28.9 pg (ref 26.0–34.0)
MCHC: 30.5 g/dL (ref 30.0–36.0)
MCV: 94.7 fL (ref 78.0–100.0)
PLATELETS: 189 10*3/uL (ref 150–400)
RBC: 3.74 MIL/uL — AB (ref 4.22–5.81)
RDW: 14.2 % (ref 11.5–15.5)
WBC: 12.7 10*3/uL — ABNORMAL HIGH (ref 4.0–10.5)

## 2017-06-28 LAB — TROPONIN I

## 2017-06-28 LAB — TYPE AND SCREEN
ABO/RH(D): B NEG
Antibody Screen: NEGATIVE

## 2017-06-28 LAB — I-STAT TROPONIN, ED: TROPONIN I, POC: 0.01 ng/mL (ref 0.00–0.08)

## 2017-06-28 LAB — CBG MONITORING, ED: Glucose-Capillary: 182 mg/dL — ABNORMAL HIGH (ref 65–99)

## 2017-06-28 LAB — BRAIN NATRIURETIC PEPTIDE: B NATRIURETIC PEPTIDE 5: 38 pg/mL (ref 0.0–100.0)

## 2017-06-28 LAB — VITAMIN B12: Vitamin B-12: 531 pg/mL (ref 180–914)

## 2017-06-28 LAB — HEMOGLOBIN A1C
HEMOGLOBIN A1C: 6 % — AB (ref 4.8–5.6)
MEAN PLASMA GLUCOSE: 125.5 mg/dL

## 2017-06-28 LAB — LACTIC ACID, PLASMA
LACTIC ACID, VENOUS: 1.1 mmol/L (ref 0.5–1.9)
LACTIC ACID, VENOUS: 1.4 mmol/L (ref 0.5–1.9)

## 2017-06-28 LAB — POC OCCULT BLOOD, ED: Fecal Occult Bld: POSITIVE — AB

## 2017-06-28 LAB — LIPASE, BLOOD: Lipase: 37 U/L (ref 11–51)

## 2017-06-28 LAB — FOLATE: Folate: 9.1 ng/mL (ref >5.9)

## 2017-06-28 MED ORDER — ACETAMINOPHEN 325 MG PO TABS
650.0000 mg | ORAL_TABLET | ORAL | Status: DC | PRN
  Administered 2017-06-28: 650 mg via ORAL
  Filled 2017-06-28 (×2): qty 2

## 2017-06-28 MED ORDER — ALPRAZOLAM 0.5 MG PO TABS
0.5000 mg | ORAL_TABLET | Freq: Three times a day (TID) | ORAL | Status: DC | PRN
  Administered 2017-06-28 – 2017-06-29 (×2): 0.5 mg via ORAL
  Filled 2017-06-28 (×2): qty 1

## 2017-06-28 MED ORDER — TIOTROPIUM BROMIDE MONOHYDRATE 18 MCG IN CAPS
18.0000 ug | ORAL_CAPSULE | Freq: Every day | RESPIRATORY_TRACT | Status: DC | PRN
  Filled 2017-06-28: qty 5

## 2017-06-28 MED ORDER — FERROUS SULFATE 325 (65 FE) MG PO TABS
325.0000 mg | ORAL_TABLET | Freq: Three times a day (TID) | ORAL | Status: DC
  Administered 2017-06-28 – 2017-06-30 (×6): 325 mg via ORAL
  Filled 2017-06-28 (×6): qty 1

## 2017-06-28 MED ORDER — ATORVASTATIN CALCIUM 40 MG PO TABS
40.0000 mg | ORAL_TABLET | Freq: Every day | ORAL | Status: DC
  Administered 2017-06-28 – 2017-06-29 (×2): 40 mg via ORAL
  Filled 2017-06-28 (×2): qty 1

## 2017-06-28 MED ORDER — VITAMIN B-12 1000 MCG PO TABS
1000.0000 ug | ORAL_TABLET | Freq: Every day | ORAL | Status: DC
  Administered 2017-06-29 – 2017-06-30 (×2): 1000 ug via ORAL
  Filled 2017-06-28 (×2): qty 1

## 2017-06-28 MED ORDER — FENTANYL CITRATE (PF) 100 MCG/2ML IJ SOLN
25.0000 ug | Freq: Once | INTRAMUSCULAR | Status: AC
  Administered 2017-06-28: 25 ug via INTRAVENOUS
  Filled 2017-06-28: qty 2

## 2017-06-28 MED ORDER — VITAMIN D 1000 UNITS PO TABS
1000.0000 [IU] | ORAL_TABLET | Freq: Every day | ORAL | Status: DC
Start: 2017-06-28 — End: 2017-06-30
  Administered 2017-06-29 – 2017-06-30 (×2): 1000 [IU] via ORAL
  Filled 2017-06-28 (×2): qty 1

## 2017-06-28 MED ORDER — SODIUM CHLORIDE 0.9 % IV SOLN
INTRAVENOUS | Status: DC
  Administered 2017-06-28: 18:00:00 via INTRAVENOUS

## 2017-06-28 MED ORDER — INSULIN GLARGINE 100 UNIT/ML ~~LOC~~ SOLN
12.0000 [IU] | Freq: Every day | SUBCUTANEOUS | Status: DC
  Administered 2017-06-28 – 2017-06-29 (×2): 12 [IU] via SUBCUTANEOUS
  Filled 2017-06-28 (×2): qty 0.12

## 2017-06-28 MED ORDER — SODIUM CHLORIDE 0.9 % IV BOLUS (SEPSIS)
1000.0000 mL | Freq: Once | INTRAVENOUS | Status: AC
  Administered 2017-06-28: 1000 mL via INTRAVENOUS

## 2017-06-28 MED ORDER — CITALOPRAM HYDROBROMIDE 20 MG PO TABS
20.0000 mg | ORAL_TABLET | Freq: Every day | ORAL | Status: DC
  Administered 2017-06-29 – 2017-06-30 (×2): 20 mg via ORAL
  Filled 2017-06-28 (×2): qty 1

## 2017-06-28 MED ORDER — PANTOPRAZOLE SODIUM 40 MG IV SOLR
40.0000 mg | Freq: Every day | INTRAVENOUS | Status: DC
  Administered 2017-06-28 – 2017-06-29 (×2): 40 mg via INTRAVENOUS
  Filled 2017-06-28 (×2): qty 40

## 2017-06-28 MED ORDER — ALBUTEROL SULFATE (2.5 MG/3ML) 0.083% IN NEBU: 3.0000 mL | INHALATION_SOLUTION | Freq: Four times a day (QID) | RESPIRATORY_TRACT | Status: DC | PRN

## 2017-06-28 MED ORDER — GABAPENTIN 300 MG PO CAPS
300.0000 mg | ORAL_CAPSULE | Freq: Three times a day (TID) | ORAL | Status: DC
  Administered 2017-06-28 – 2017-06-30 (×4): 300 mg via ORAL
  Filled 2017-06-28 (×4): qty 1

## 2017-06-28 MED ORDER — INSULIN GLARGINE 100 UNIT/ML ~~LOC~~ SOLN: 12.0000 [IU] | Freq: Every day | SUBCUTANEOUS | Status: DC

## 2017-06-28 MED ORDER — INSULIN ASPART 100 UNIT/ML ~~LOC~~ SOLN
0.0000 [IU] | Freq: Three times a day (TID) | SUBCUTANEOUS | Status: DC
  Administered 2017-06-28 – 2017-06-29 (×3): 2 [IU] via SUBCUTANEOUS
  Administered 2017-06-29: 5 [IU] via SUBCUTANEOUS
  Administered 2017-06-29: 3 [IU] via SUBCUTANEOUS
  Administered 2017-06-30: 5 [IU] via SUBCUTANEOUS
  Filled 2017-06-28: qty 1

## 2017-06-28 MED ORDER — SODIUM CHLORIDE 0.9 % IV BOLUS (SEPSIS)
500.0000 mL | Freq: Once | INTRAVENOUS | Status: AC
  Administered 2017-06-28: 500 mL via INTRAVENOUS

## 2017-06-28 MED ORDER — MOMETASONE FURO-FORMOTEROL FUM 200-5 MCG/ACT IN AERO
2.0000 | INHALATION_SPRAY | Freq: Two times a day (BID) | RESPIRATORY_TRACT | Status: DC
  Administered 2017-06-28 – 2017-06-30 (×4): 2 via RESPIRATORY_TRACT
  Filled 2017-06-28: qty 8.8

## 2017-06-28 MED ORDER — TORSEMIDE 20 MG PO TABS
20.0000 mg | ORAL_TABLET | Freq: Two times a day (BID) | ORAL | Status: DC
  Administered 2017-06-28 – 2017-06-30 (×4): 20 mg via ORAL
  Filled 2017-06-28 (×4): qty 1

## 2017-06-28 MED ORDER — TRAMADOL HCL 50 MG PO TABS
50.0000 mg | ORAL_TABLET | Freq: Four times a day (QID) | ORAL | Status: DC | PRN
  Administered 2017-06-30 (×2): 50 mg via ORAL
  Filled 2017-06-28: qty 1
  Filled 2017-06-28: qty 2

## 2017-06-28 MED ORDER — SUCRALFATE 1 GM/10ML PO SUSP
1.0000 g | Freq: Three times a day (TID) | ORAL | Status: DC
  Administered 2017-06-28 – 2017-06-29 (×3): 1 g via ORAL
  Filled 2017-06-28 (×3): qty 10

## 2017-06-28 MED ORDER — ONDANSETRON HCL 4 MG/2ML IJ SOLN
4.0000 mg | Freq: Four times a day (QID) | INTRAMUSCULAR | Status: DC | PRN
Start: 2017-06-28 — End: 2017-06-30

## 2017-06-28 MED ORDER — PANTOPRAZOLE SODIUM 40 MG PO TBEC: 40.0000 mg | DELAYED_RELEASE_TABLET | Freq: Every day | ORAL | Status: DC | PRN

## 2017-06-28 MED ORDER — GI COCKTAIL ~~LOC~~: 30.0000 mL | Freq: Four times a day (QID) | ORAL | Status: DC | PRN

## 2017-06-28 NOTE — Progress Notes (Signed)
  Echocardiogram 2D Echocardiogram has been performed.  [name] M 06/28/2017, 3:06 PM

## 2017-06-28 NOTE — ED Notes (Signed)
Pt upset wanting to sit on side of the bed. Explained to pt that due to his HR and his weakness it is important that he stays in the bed. Repositioned the patient with NT. Pt states no improvement to his comfort. Vitals stable at this time.

## 2017-06-28 NOTE — ED Provider Notes (Signed)
11:26 AM Assumed care from Dr. Betsey Holiday, please see their note for full history, physical and decision making until this point. In brief this is a 76 y.o. year old male who presented to the ED tonight with Abdominal Pain and Chest Pain     76 year old male here with concern for possible gastric intestinal hemorrhage. Pending CT scan, labs, pain control and disposition pending.  On my reexamination patient still with some [place] sniffily improved he still tachycardic in the 1 teens and still complaining of weakness. A repeat CBC and lactic acid showed a hemoglobin had dropped by about a point and persistently elevated white blood cell count. Lactic acid seems to have improved. He is complaining of diarrhea, gas and weakness worse is normal. Concern for possible C. Difficile so we will get a stool sample. Also with this chest pain intermittently weakness and a history of coronary artery disease and think the patient deserves observation to rule out ACS. Continue fluid hydration at this point.  Discussed with medicine, will observe for changes and further workup.   Labs, studies and imaging reviewed by myself and considered in medical decision making if ordered. Imaging interpreted by radiology.  Labs Reviewed  CBC WITH DIFFERENTIAL/PLATELET - Abnormal; Notable for the following:       Result Value   WBC 15.4 (*)    RBC 4.13 (*)    Hemoglobin 12.4 (*)    HCT 38.7 (*)    Neutro Abs 13.1 (*)    All other components within normal limits  COMPREHENSIVE METABOLIC PANEL - Abnormal; Notable for the following:    Chloride 99 (*)    Glucose, Bld 207 (*)    Creatinine, Ser 1.26 (*)    Calcium 8.7 (*)    AST 14 (*)    ALT 10 (*)    Alkaline Phosphatase 140 (*)    GFR calc non Af Amer 54 (*)    All other components within normal limits  CBC WITH DIFFERENTIAL/PLATELET - Abnormal; Notable for the following:    WBC 13.9 (*)    RBC 3.82 (*)    Hemoglobin 11.3 (*)    HCT 36.2 (*)    Neutro Abs 11.4  (*)    Monocytes Absolute 1.2 (*)    All other components within normal limits  I-STAT CG4 LACTIC ACID, ED - Abnormal; Notable for the following:    Lactic Acid, Venous 2.30 (*)    All other components within normal limits  POC OCCULT BLOOD, ED - Abnormal; Notable for the following:    Fecal Occult Bld POSITIVE (*)    All other components within normal limits  GASTROINTESTINAL PANEL BY PCR, STOOL (REPLACES STOOL CULTURE)  C DIFFICILE QUICK SCREEN W PCR REFLEX  BRAIN NATRIURETIC PEPTIDE  LIPASE, BLOOD  PROTIME-INR  LACTIC ACID, PLASMA  LACTIC ACID, PLASMA  I-STAT TROPONIN, ED  TYPE AND SCREEN    CT Abdomen Pelvis Wo Contrast  Final Result      No Follow-up on file.    Merrily Pew, MD 06/28/17 [phone]

## 2017-06-28 NOTE — ED Notes (Signed)
IV team bedside. 

## 2017-06-28 NOTE — ED Notes (Signed)
Admitting at bedside 

## 2017-06-28 NOTE — ED Notes (Signed)
MD at bedside. 
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
## 2017-06-28 NOTE — ED Notes (Signed)
Patient transported to X-ray 

## 2017-06-28 NOTE — ED Notes (Signed)
Pt given diet ginger ale per Brooke(RN)

## 2017-06-29 ENCOUNTER — Encounter (HOSPITAL_COMMUNITY): Payer: Self-pay | Admitting: Physician Assistant

## 2017-06-29 DIAGNOSIS — N179 Acute kidney failure, unspecified: Secondary | ICD-10-CM

## 2017-06-29 DIAGNOSIS — D649 Anemia, unspecified: Secondary | ICD-10-CM | POA: Diagnosis not present

## 2017-06-29 DIAGNOSIS — J439 Emphysema, unspecified: Secondary | ICD-10-CM

## 2017-06-29 DIAGNOSIS — D62 Acute posthemorrhagic anemia: Secondary | ICD-10-CM

## 2017-06-29 DIAGNOSIS — F3162 Bipolar disorder, current episode mixed, moderate: Secondary | ICD-10-CM

## 2017-06-29 DIAGNOSIS — K558 Other vascular disorders of intestine: Secondary | ICD-10-CM | POA: Diagnosis not present

## 2017-06-29 DIAGNOSIS — K921 Melena: Secondary | ICD-10-CM

## 2017-06-29 DIAGNOSIS — I130 Hypertensive heart and chronic kidney disease with heart failure and stage 1 through stage 4 chronic kidney disease, or unspecified chronic kidney disease: Secondary | ICD-10-CM | POA: Diagnosis not present

## 2017-06-29 DIAGNOSIS — R1084 Generalized abdominal pain: Secondary | ICD-10-CM | POA: Diagnosis not present

## 2017-06-29 DIAGNOSIS — E118 Type 2 diabetes mellitus with unspecified complications: Secondary | ICD-10-CM | POA: Diagnosis not present

## 2017-06-29 DIAGNOSIS — E785 Hyperlipidemia, unspecified: Secondary | ICD-10-CM | POA: Diagnosis not present

## 2017-06-29 DIAGNOSIS — I2510 Atherosclerotic heart disease of native coronary artery without angina pectoris: Secondary | ICD-10-CM

## 2017-06-29 DIAGNOSIS — R109 Unspecified abdominal pain: Secondary | ICD-10-CM | POA: Diagnosis not present

## 2017-06-29 DIAGNOSIS — D500 Iron deficiency anemia secondary to blood loss (chronic): Secondary | ICD-10-CM | POA: Diagnosis not present

## 2017-06-29 LAB — GASTROINTESTINAL PANEL BY PCR, STOOL (REPLACES STOOL CULTURE)
ASTROVIRUS: NOT DETECTED
Adenovirus F40/41: NOT DETECTED
CAMPYLOBACTER SPECIES: NOT DETECTED
Cryptosporidium: NOT DETECTED
Cyclospora cayetanensis: NOT DETECTED
ENTAMOEBA HISTOLYTICA: NOT DETECTED
ENTEROPATHOGENIC E COLI (EPEC): NOT DETECTED
ENTEROTOXIGENIC E COLI (ETEC): NOT DETECTED
Enteroaggregative E coli (EAEC): NOT DETECTED
Giardia lamblia: NOT DETECTED
NOROVIRUS GI/GII: NOT DETECTED
Plesimonas shigelloides: NOT DETECTED
Rotavirus A: NOT DETECTED
SAPOVIRUS (I, II, IV, AND V): NOT DETECTED
SHIGA LIKE TOXIN PRODUCING E COLI (STEC): NOT DETECTED
Salmonella species: NOT DETECTED
Shigella/Enteroinvasive E coli (EIEC): NOT DETECTED
VIBRIO CHOLERAE: NOT DETECTED
Vibrio species: NOT DETECTED
Yersinia enterocolitica: NOT DETECTED

## 2017-06-29 LAB — BASIC METABOLIC PANEL
ANION GAP: 11 (ref 5–15)
BUN: 16 mg/dL (ref 6–20)
CO2: 26 mmol/L (ref 22–32)
Calcium: 8.3 mg/dL — ABNORMAL LOW (ref 8.9–10.3)
Chloride: 99 mmol/L — ABNORMAL LOW (ref 101–111)
Creatinine, Ser: 1.25 mg/dL — ABNORMAL HIGH (ref 0.61–1.24)
GFR calc Af Amer: >60 mL/min (ref >60)
GFR calc non Af Amer: 55 mL/min — ABNORMAL LOW (ref >60)
GLUCOSE: 235 mg/dL — AB (ref 65–99)
POTASSIUM: 4 mmol/L (ref 3.5–5.1)
Sodium: 136 mmol/L (ref 135–145)

## 2017-06-29 LAB — CBC
HEMATOCRIT: 34.7 % — AB (ref 39.0–52.0)
Hemoglobin: 10.7 g/dL — ABNORMAL LOW (ref 13.0–17.0)
MCH: 29.2 pg (ref 26.0–34.0)
MCHC: 30.8 g/dL (ref 30.0–36.0)
MCV: 94.6 fL (ref 78.0–100.0)
Platelets: 163 10*3/uL (ref 150–400)
RBC: 3.67 MIL/uL — AB (ref 4.22–5.81)
RDW: 14.1 % (ref 11.5–15.5)
WBC: 8 10*3/uL (ref 4.0–10.5)

## 2017-06-29 LAB — GLUCOSE, CAPILLARY
GLUCOSE-CAPILLARY: 241 mg/dL — AB (ref 65–99)
GLUCOSE-CAPILLARY: 192 mg/dL — AB (ref 65–99)
Glucose-Capillary: 276 mg/dL — ABNORMAL HIGH (ref 65–99)
Glucose-Capillary: 178 mg/dL — ABNORMAL HIGH (ref 65–99)

## 2017-06-29 MED ORDER — PANTOPRAZOLE SODIUM 40 MG PO TBEC
40.0000 mg | DELAYED_RELEASE_TABLET | Freq: Every day | ORAL | Status: DC
  Administered 2017-06-30: 40 mg via ORAL
  Filled 2017-06-29: qty 1

## 2017-06-29 NOTE — Progress Notes (Signed)
Pt is irritable and verbally abusive towards staff this morning.  Pt is cursing at RN and MD, complaining about his care.  Pt wants to eat, however needs to be seen by GI before he can eat.  Pt does not seem to understand why he can't eat, even after explaining it to him.  Pt given morning medications, will continue to monitor.

## 2017-06-29 NOTE — Progress Notes (Addendum)
[facility]                                                                              Patient Demographics  [name], is a 76 y.o. male, DOB - 06-Jun-1941, XKG:[id number]  Admit date - 06/28/2017   Admitting Physician Waldemar Dickens, MD  Outpatient Primary MD for the patient is [name], MD  Outpatient specialists:   LOS - 0  days   Medical records reviewed and are as summarized below:    Chief Complaint  Patient presents with  . Abdominal Pain  . Chest Pain       Brief summary   Per admit note by Dr. Barbaraann Faster on 9/12 [name] is a 76 y.o. 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 Bobbe Medico with cauterization of  AVM, presenting with 3 day history of generalized abdominal pain, intermittent, slightly improved with consumption of ginger ale and Imodium, as well as dark stools, leak with. The patient reports 3-4 episodes of liquid diarrhea during this presentation. He reports being very weak, fatigued, and he is also reporting chest pain, exertional, without nausea, vomiting or diaphoresis or radiation to the arm. The patient denies any fever, chills, dysuria, frequency, flank pain.  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  Active Problems: Abdominal pain with diarrhea - GI pathogen panel in progress, C. difficile negative - Currently symptoms improving, started on diet  Atypical chest pain, known history of CAD - Troponins negative, EKG without evidence of ACS - 2-D echo showed EF of 6065% with grade 1 diastolic dysfunction    Hyperlipidemia - Continue Lipitor, LDL 67    Essential hypertension -BP currently stable     COPD (chronic obstructive pulmonary disease) (HCC) -Currently stable, continue Spiriva, dulera, albuterol as needed      Diastolic HF (heart failure) (HCC) - Currently compensated, euvolemic - 2-D echo showed EF of 60-65% with grade 1 diastolic dysfunction  - DC IVF, continue Demadex    Iron deficiency anemia due to chronic blood loss - Baseline hemoglobin around 9 - GI consulted, has history of multiple previous blood transfusions, currently H&H stable     Bipolar 1 disorder, mixed, moderate ([place]) with  depression, anxiety  - Continue Xanax  as needed, patient with rude behavior towards the RN staff, physician. Demanded food and explained that GI needs to see him as a consult prior to initiating diet at which patient got upset and rude.       AKI (acute kidney injury) ([place]) on chronic kidney stage II  - Baseline creatinine 1.1- 1.2, currently in Baseline     Diabetes mellitus with complication (HCC) - Continue sliding scale insulin, hemoglobin A1c 6.0 on 9/12    Code Status: full code  DVT Prophylaxis:   SCD's Family Communication: Discussed in detail with the patient, all imaging results, lab results explained to the patient  Disposition Plan: Likely tomorrow if H&H is stable  Time Spent in minutes   25  minutes  Procedures:  CT abdomen  Consultants  GI  Antimicrobials :      Medications  Scheduled Meds: . atorvastatin  40 mg Oral QHS  . cholecalciferol  1,000 Units Oral Daily  . citalopram  20 mg Oral Daily  . ferrous sulfate  325 mg Oral TID WC  . gabapentin  300 mg Oral TID  . insulin aspart  0-9 Units Subcutaneous TID WC  . insulin glargine  12 Units Subcutaneous QHS  . mometasone-formoterol  2 puff Inhalation BID  . pantoprazole  40 mg Oral Q0600  . torsemide  20 mg Oral BID  . vitamin B-12  1,000 mcg Oral Daily   Continuous Infusions: . sodium chloride 100 mL/hr at 06/28/17 1808   PRN Meds:.acetaminophen, albuterol, ALPRAZolam, gi cocktail, ondansetron (ZOFRAN) IV, tiotropium, traMADol   Antibiotics   Anti-infectives    None        Subjective:   [name] was seen and examined today. Abdominal pain resolved, no nausea or vomiting.  Patient denies dizziness, chest pain, shortness of breath, abdominal pain, N/V/D/C, new weakness, numbess, tingling. No acute events overnight.    Objective:   Vitals:   06/28/17 2014 06/29/17 0053 06/29/17 0504 06/29/17 0800  BP: 118/81 115/69 115/67 108/73  Pulse: (!) 102 98 (!) 110 83  Resp: 18 18 20 18   Temp: 98 F (36.7 C) 98.2 F (36.8 C) 98.2 F (36.8 C) 98 F (36.7 C)  TempSrc: Oral Oral Oral Oral  SpO2: 99% 97% 99% 94%  Weight:   116.8 kg (257 lb 6.4 oz)   Height:        Intake/Output Summary (Last 24 hours) at 06/29/17 1115 Last data filed at 06/29/17 0900  Gross per 24 hour  Intake             1372 ml  Output             1525 ml  Net             -153 ml     Wt Readings from Last 3 Encounters:  06/29/17 116.8 kg (257 lb 6.4 oz)  05/10/17 115.7 kg (255 lb)  04/03/17 118 kg (260 lb 3.2 oz)     Exam  General: Alert and oriented x 3, NAD  Eyes:   HEENT:  Atraumatic, normocephalic  Cardiovascular: S1 S2 auscultated, no rubs, murmurs or gallops. Regular rate and rhythm.  Respiratory: Clear to auscultation bilaterally, no wheezing, rales  or rhonchi  Gastrointestinal: Soft, nontender, nondistended, + bowel sounds  Ext: no pedal edema bilaterally  Neuro: AAOx3, Cr N's II- XII. Strength 5/5 upper and lower extremities bilaterally, speech clear  Musculoskeletal: No digital cyanosis, clubbing  Skin: No  rashes  Psych: upset and rude, alert and oriented x3    Data Reviewed:  I have personally reviewed following labs and imaging studies  Micro Results Recent Results (from the past 240 hour(s))  Culture, blood (Routine X 2) w Reflex to ID Panel     Status: None (Preliminary result)   Collection Time: 06/28/17  7:00 AM  Result Value Ref Range Status   Specimen Description BLOOD LEFT ANTECUBITAL  Final   Special Requests   Final    BOTTLES DRAWN AEROBIC AND ANAEROBIC Blood Culture adequate volume   Culture NO GROWTH < 24 HOURS  Final   Report Status PENDING  Incomplete  Culture, blood (Routine X 2) w Reflex to ID Panel     Status: None (Preliminary result)   Collection Time: 06/28/17  7:00 AM  Result Value Ref Range Status   Specimen Description BLOOD  Final   Special Requests   Final    BOTTLES DRAWN AEROBIC AND ANAEROBIC Blood Culture adequate volume   Culture NO GROWTH < 24 HOURS  Final   Report Status PENDING  Incomplete  C difficile quick scan w PCR reflex     Status: None   Collection Time: 06/28/17 11:11 AM  Result Value Ref Range Status   C Diff antigen NEGATIVE NEGATIVE Final   C Diff toxin NEGATIVE NEGATIVE Final   C Diff interpretation No C. difficile detected.  Final    Radiology Reports Ct Abdomen Pelvis Wo Contrast  Result Date: 06/28/2017 CLINICAL DATA:  Diffuse abdominal pain extending into the chest for 3 days. Dark colored stools. No abdominal aortic aneurysm. EXAM: CT ABDOMEN AND PELVIS WITHOUT CONTRAST TECHNIQUE: Multidetector CT imaging of the abdomen and pelvis was performed following the standard protocol without IV contrast. COMPARISON:  CT of the abdomen pelvis with contrast 05/08/2017  FINDINGS: Lower chest: Scarring or atelectasis at the lung bases is stable. Coronary artery calcifications are again noted. No significant pleural or pericardial effusion is present. A large hiatal hernia is again noted. Hepatobiliary: No focal liver abnormality is seen. No gallstones, gallbladder wall thickening, or biliary dilatation. Pancreas: Unremarkable. No pancreatic ductal dilatation or surrounding inflammatory changes. Spleen: Normal in size without focal abnormality. Adrenals/Urinary Tract: The adrenal glands are normal bilaterally. There is some stranding about both kidneys without obstruction or focal mass lesion. Ureters are within normal limits. There are no stones. The urinary bladder is within normal limits. Stomach/Bowel: A large hiatal hernia is present. At least 1/3 of the stomach is above the diaphragm. This is unchanged. The duodenum is normal. The small bowel is within normal limits. The appendix is surgically absent. The ascending and transverse colon are within normal limits. Diverticular changes are present in the descending and sigmoid colon without focal inflammation. The patient is status post laparotomy. Vascular/Lymphatic: Abdominal aortic stent graft is stable in position. There is no evidence for leak on this noncontrast study. No significant adenopathy is present. Reproductive: Prostate is unremarkable. Other: Fat herniates into the inguinal canals bilaterally. There is also fat at the umbilicus. No associated bowel herniation is present. There is no significant adhesions. Musculoskeletal: Facet degenerative changes are present in the lower lumbar spine. Endplate spurring on the left at L5-S1 contributes to moderate left foraminal stenosis. Vertebral body heights are maintained. No focal lytic or blastic lesions are present. Bony pelvis is within normal limits. The hips are unremarkable. IMPRESSION: 1. No acute or focal lesion to explain the patient's symptoms. 2. Sigmoid  diverticulosis without diverticulitis. 3. Stable  appearance of aortic stent graft. Aortic aneurysm NOS (ICD10-I71.9). The aneurysm is stable. 4. Degenerative changes of the lower lumbar spine. 5.  Aortic Atherosclerosis (ICD10-I70.0). 6. Stable large hiatal hernia. Electronically Signed   By: San Morelle M.D.   On: 06/28/2017 07:48   Dg Chest 1 View  Result Date: 06/28/2017 CLINICAL DATA:  CHF EXAM: CHEST 1 VIEW COMPARISON:  05/08/2017 FINDINGS: Lower mediastinal widening from a large hiatal hernia based on previous chest CT. Non obscured heart size is likely normal. Status post CABG. No evidence of edema, effusion, or pneumothorax. Borderline hyperinflation. Patient has history of COPD. Limited portable study, with EKG leads crossing the chest. Remote right rib fracture. IMPRESSION: 1. Limited portable study without acute finding. 2. Large hiatal hernia. Electronically Signed   By: Monte Fantasia M.D.   On: 06/28/2017 13:15    Lab Data:  CBC:  Recent Labs Lab 06/28/17 0610 06/28/17 1026 06/28/17 1745 06/29/17 0753  WBC 15.4* 13.9* 12.7* 8.0  NEUTROABS 13.1* 11.4*  --   --   HGB 12.4* 11.3* 10.8* 10.7*  HCT 38.7* 36.2* 35.4* 34.7*  MCV 93.7 94.8 94.7 94.6  PLT 201 259 189 408   Basic Metabolic Panel:  Recent Labs Lab 06/28/17 0610 06/29/17 0753  NA 136 136  K 4.2 4.0  CL 99* 99*  CO2 27 26  GLUCOSE 207* 235*  BUN 18 16  CREATININE 1.26* 1.25*  CALCIUM 8.7* 8.3*   GFR: Estimated Creatinine Clearance: 69.9 mL/min (A) (by C-G formula based on SCr of 1.25 mg/dL (H)). Liver Function Tests:  Recent Labs Lab 06/28/17 0610  AST 14*  ALT 10*  ALKPHOS 140*  BILITOT 0.3  PROT 6.7  ALBUMIN 3.5    Recent Labs Lab 06/28/17 0610  LIPASE 37   No results for input(s): AMMONIA in the last 168 hours. Coagulation Profile:  Recent Labs Lab 06/28/17 0610  INR 1.00   Cardiac Enzymes:  Recent Labs Lab 06/28/17 1419 06/28/17 1745 06/28/17 2038  TROPONINI  <0.03 <0.03 <0.03   BNP (last 3 results) No results for input(s): PROBNP in the last 8760 hours. HbA1C:  Recent Labs  06/28/17 1419  HGBA1C 6.0*   CBG:  Recent Labs Lab 06/28/17 1339 06/28/17 1810 06/28/17 2119 06/29/17 0736  GLUCAP 182* 181* 180* 276*   Lipid Profile:  Recent Labs  06/28/17 1419  CHOL 127  HDL 28*  LDLCALC 67  TRIG 160*  CHOLHDL 4.5   Thyroid Function Tests: No results for input(s): TSH, T4TOTAL, FREET4, T3FREE, THYROIDAB in the last 72 hours. Anemia Panel:  Recent Labs  06/28/17 1419  VITAMINB12 531  FOLATE 9.1  FERRITIN 48  TIBC 347  IRON 25*  RETICCTPCT 3.7*   Urine analysis:    Component Value Date/Time   COLORURINE YELLOW 05/08/2017 0614   APPEARANCEUR CLEAR 05/08/2017 0614   LABSPEC 1.015 05/08/2017 0614   PHURINE 5.0 05/08/2017 0614   GLUCOSEU NEGATIVE 05/08/2017 0614   HGBUR NEGATIVE 05/08/2017 0614   BILIRUBINUR NEGATIVE 05/08/2017 0614   BILIRUBINUR small 02/13/2015 1354   KETONESUR NEGATIVE 05/08/2017 0614   PROTEINUR NEGATIVE 05/08/2017 0614   UROBILINOGEN 0.2 02/13/2015 1354   UROBILINOGEN 0.2 08/24/2014 1948   NITRITE NEGATIVE 05/08/2017 0614   LEUKOCYTESUR NEGATIVE 05/08/2017 1448     Ripudeep Rai M.D. [facility] 06/29/2017, 11:15 AM  Pager: [phone] Between 7am to 7pm - call Pager - [phone]  After 7pm go to [email or web address] - password [email or web address]  Call night coverage person covering after 7pm

## 2017-06-29 NOTE — Consult Note (Signed)
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 CLINICAL DATA:  CHF EXAM: CHEST 1 VIEW COMPARISON:  05/08/2017 FINDINGS: Lower mediastinal widening from a large hiatal hernia based on previous chest CT. Non obscured heart size is likely normal. Status post CABG. No evidence of edema, effusion, or pneumothorax. Borderline hyperinflation. Patient has history of COPD. Limited portable study, with EKG leads crossing the chest. Remote right rib fracture. IMPRESSION: 1. Limited portable study without acute finding. 2. Large hiatal hernia. Electronically Signed   By: Monte Fantasia M.D.   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  Loose stools, diarrhea are C diff negative. GI path panel is processing. Innumerable previous endos/colons.    *  Abdominal pain, diffuse. Resolved. Noncontrast CT unrevealing.  Wonder if he had some hypoperfusion, slight  ischemic insult to the bowel?  ALP elevated but o/w normal LFTs and lipase.   *  Blood loss anemia, chronic.  Multiple previous blood transfusions.  Not in need of transfusion currently.  *  Advanced COPD, on home oxygen.  Morbid obesity   High risk for sedation.      PLAN:     *  Blood transfusion as needed.  No plans to repeat endoscopic studies as per Dr Loletha Carrow' plan outlined in 04/2017.  Advance to cart modified diet.  *  Resume oral Protonix in place of IV Protonix.  Stopped Carafate.  CBC in AM.     Azucena Freed  06/29/2017, 8:49 AM Pager: [phone]  I have reviewed the entire case in detail with the above APP and discussed the plan in detail.  Therefore, I agree with the diagnoses recorded above. In addition,  I have personally interviewed and examined the patient and have personally reviewed any abdominal/pelvic CT scan images.  My additional thoughts are as follows:   I'm familiar with this patient from his July hospitalization. He has a long complex history of recurrent GI bleeding, and I believe it is most likely from obscure small bowel AVMs. His hemoglobin has stabilized since yesterday, and I am hopeful that means that this bleeding will remain stopped. I have no current plans for endoscopic procedures on him, please see my previous endoscopic reports for findings and proposed management plan. The nature of his recent generalized abdominal pain is unclear, workup so far negative and he is feeling better. He does not currently need transfusion, but his hemoglobin should be monitored regularly. He can remain on a regular diet for now.  If his hemoglobin remains stable, we will most likely sign off to be called as needed.  Nelida Meuse III Pager [phone]  Mon-Fri 8a-5p [phone] after 5p, weekends, holidays

## 2017-06-29 NOTE — Care Management Note (Signed)
Case Management Note  Patient Details  Name: [name] MRN: [id number] Date of Birth: 05-12-1941  Subjective/Objective:  Chronic GIB                 Action/Plan: Patient lives at home with spouse; PCP is Dr [name], MD; has private insurance with [facility]; patient could benefit from [facility] services, was active with [facility] for [facility] services; CM call his PCP, talked to [facility] in the office, Dr Mancel Bale will not approve any [place] services until he is seen in the office.  Expected Discharge Date:  Possibly 07/03/2017              Expected Discharge Plan:  Home/Self Care  Discharge planning Services  CM Consult   Status of Service:  In process, will continue to follow  Sherrilyn Rist [phone] 06/29/2017, 11:42 AM

## 2017-06-29 NOTE — Consult Note (Signed)
   [facility] CM Inpatient Consult   06/29/2017  [name] [birth year] [id number]   Referral from nurse to see if patient qualifies for [place] [facility].  Met with the patient and tried to talk with him about his primary care provider.  He states, "well if you want to know about me you need to hear with I have going on and about my life for you to make an assessment of my needs. My wife will be having colon surgery soon and she is my transportation."  He endorses Dr. [name] is his primary care provider.  This physician is not a [facility] physician.  Patient states he has [facility] and needs more services with them but "my doctor will only sign off on the physical therapist."   Attempt to explain how resources in the community for private pay.  Patient states, "What the f*###, I can look all of this sh*^ up on Google?" Explained to the patient this writer will speak with inpatient RNCM regarding his needs. Sign off.  Patient's primary care provider in not in the [place] management network.  Natividad Brood, RN BSN [facility] Liaison  [phone] business mobile phone Toll free office [phone]

## 2017-06-30 DIAGNOSIS — N179 Acute kidney failure, unspecified: Secondary | ICD-10-CM | POA: Diagnosis not present

## 2017-06-30 DIAGNOSIS — R1084 Generalized abdominal pain: Secondary | ICD-10-CM | POA: Diagnosis not present

## 2017-06-30 DIAGNOSIS — D649 Anemia, unspecified: Secondary | ICD-10-CM

## 2017-06-30 DIAGNOSIS — I5032 Chronic diastolic (congestive) heart failure: Secondary | ICD-10-CM

## 2017-06-30 DIAGNOSIS — K921 Melena: Secondary | ICD-10-CM | POA: Diagnosis not present

## 2017-06-30 DIAGNOSIS — F3162 Bipolar disorder, current episode mixed, moderate: Secondary | ICD-10-CM | POA: Diagnosis not present

## 2017-06-30 DIAGNOSIS — E118 Type 2 diabetes mellitus with unspecified complications: Secondary | ICD-10-CM

## 2017-06-30 DIAGNOSIS — R109 Unspecified abdominal pain: Secondary | ICD-10-CM | POA: Diagnosis not present

## 2017-06-30 LAB — CBC
HEMATOCRIT: 32.2 % — AB (ref 39.0–52.0)
Hemoglobin: 10.1 g/dL — ABNORMAL LOW (ref 13.0–17.0)
MCH: 29.3 pg (ref 26.0–34.0)
MCHC: 31.4 g/dL (ref 30.0–36.0)
MCV: 93.3 fL (ref 78.0–100.0)
PLATELETS: 155 10*3/uL (ref 150–400)
RBC: 3.45 MIL/uL — ABNORMAL LOW (ref 4.22–5.81)
RDW: 14 % (ref 11.5–15.5)
WBC: 7.2 10*3/uL (ref 4.0–10.5)

## 2017-06-30 LAB — BASIC METABOLIC PANEL
Anion gap: 8 (ref 5–15)
BUN: 18 mg/dL (ref 6–20)
CO2: 30 mmol/L (ref 22–32)
Calcium: 8.2 mg/dL — ABNORMAL LOW (ref 8.9–10.3)
Chloride: 99 mmol/L — ABNORMAL LOW (ref 101–111)
Creatinine, Ser: 1.35 mg/dL — ABNORMAL HIGH (ref 0.61–1.24)
GFR calc Af Amer: 58 mL/min — ABNORMAL LOW (ref >60)
GFR, EST NON AFRICAN AMERICAN: 50 mL/min — AB (ref >60)
Glucose, Bld: 234 mg/dL — ABNORMAL HIGH (ref 65–99)
POTASSIUM: 4.3 mmol/L (ref 3.5–5.1)
SODIUM: 137 mmol/L (ref 135–145)

## 2017-06-30 LAB — GLUCOSE, CAPILLARY: GLUCOSE-CAPILLARY: 262 mg/dL — AB (ref 65–99)

## 2017-06-30 MED ORDER — PANTOPRAZOLE SODIUM 40 MG PO TBEC: 40.0000 mg | DELAYED_RELEASE_TABLET | Freq: Every day | ORAL | 3 refills | Status: DC

## 2017-06-30 NOTE — Progress Notes (Signed)
PT Cancellation Note  Patient Details Name: [name] MRN: [id number] DOB: [birth year]   Cancelled Treatment:    Reason Eval/Treat Not Completed: Other (comment) (Pt combative & refusing all medical treatment at this time.) Pt threatening to leave AMA. PT to follow up if still admitted this afternoon.  Kerryann Allaire B. Migdalia Dk PT, DPT Acute Rehabilitation  [phone] Pager [phone]     [place] 06/30/2017, 10:35 AM

## 2017-06-30 NOTE — Progress Notes (Signed)
Pt states he does not want to go over discharge paperwork. Pt states it "must be the MD time of the month" "the staff needs thicker skin, they run to the charge nurse"   Explained printed prescription for Protonix, pt states "Im not listening to her"  Informed pt he needs to follow up with his primary care. Pt states "well I haven't seen him in 3 months"  Asked if pt needs a list of primary care providers  Pt states "I've been fired by too many and I have fired the rest"  Awaiting pt ride for discharge  Informed pt his ride needs to bring his home oxygen    Pt IV discontinued, catheter intact and telemetry removed  Pt has all pt belongings

## 2017-06-30 NOTE — Progress Notes (Signed)
Pt states he wants to get dressed and leave, informed that there are no discharge orders at this time and the MD has been paged.

## 2017-06-30 NOTE — Discharge Summary (Signed)
Physician Discharge Summary   Patient ID: [name] MRN: [id number] DOB/AGE: [birth year] 75 y.o.  Admit date: 06/28/2017 Discharge date: 06/30/2017  Primary Care Physician:  [name], MD  Discharge Diagnoses:    . Abdominal pain resolved,unclear etiology possibly viral gastroenteritis . Iron deficiency anemia due to chronic blood loss . Hyperlipidemia . Essential hypertension . COPD (chronic obstructive pulmonary disease) ([place]) . Diastolic HF (heart failure) ([place]) . CAD (coronary artery disease) . Bipolar 1 disorder, mixed, moderate ([place]) . Diabetes mellitus with complication ([place]) . AKI (acute kidney injury) ([place]) . chronic GIB (gastrointestinal bleeding)   Consults: gastroenterology  Recommendations for Outpatient Follow-up:  1. Home health, PT OT, RN, home health aide arranged by the case management 2. Please repeat CBC every month, transfuse as needed for hemoglobin less than 8   DIET:  Carb modify diet    Allergies:   Allergies  Allergen Reactions  . Morphine And Related Shortness Of Breath, Nausea And Vomiting, Rash and Other (See Comments)    Reaction:  Hallucinations   . Penicillins Anaphylaxis, Hives and Other (See Comments)    Has patient had a PCN reaction causing immediate rash, facial/tongue/throat swelling, SOB or lightheadedness with hypotension: Yes Has patient had a PCN reaction causing severe rash involving mucus membranes or skin necrosis: No Has patient had a PCN reaction that required hospitalization No Has patient had a PCN reaction occurring within the last 10 years: No If all of the above answers are "NO", then may proceed with Cephalosporin use.  Marland Kitchen Zolpidem Shortness Of Breath  . Demerol [Meperidine] Other (See Comments)    Reaction:  Hallucinations    . Dilaudid [Hydromorphone Hcl] Other (See Comments)    Reaction:  Hallucinations   . Levofloxacin Other (See Comments)    Reaction:  Unknown      DISCHARGE  MEDICATIONS: Current Discharge Medication List    CONTINUE these medications which have CHANGED   Details  pantoprazole (PROTONIX) 40 MG tablet Take 1 tablet (40 mg total) by mouth daily. Qty: 30 tablet, Refills: 3      CONTINUE these medications which have NOT CHANGED   Details  albuterol (PROVENTIL HFA;VENTOLIN HFA) 108 (90 BASE) MCG/ACT inhaler Inhale 2 puffs into the lungs every 6 (six) hours as needed for wheezing or shortness of breath. Qty: 1 Inhaler, Refills: 2    ALPRAZolam (XANAX) 0.25 MG tablet Take 0.5 mg by mouth 3 (three) times daily as needed for anxiety or sleep.    atorvastatin (LIPITOR) 40 MG tablet Take 40 mg by mouth at bedtime.    budesonide-formoterol (SYMBICORT) 160-4.5 MCG/ACT inhaler Inhale 2 puffs into the lungs 2 (two) times daily.     cholecalciferol (VITAMIN D) 1000 UNITS tablet Take 1,000 Units by mouth daily.    citalopram (CELEXA) 40 MG tablet Take 0.5 tablets (20 mg total) by mouth daily.    ferrous sulfate 325 (65 FE) MG tablet Take 1 tablet (325 mg total) by mouth 3 (three) times daily with meals. Qty: 90 tablet, Refills: 1    gabapentin (NEURONTIN) 300 MG capsule Take 300 mg by mouth 3 (three) times daily.    insulin aspart (NOVOLOG) 100 UNIT/ML injection Inject 10 Units into the skin 3 (three) times daily with meals as needed for high blood sugar.     insulin glargine (LANTUS) 100 UNIT/ML injection Inject 0.25 mLs (25 Units total) into the skin 2 (two) times daily. Qty: 10 mL, Refills: 1    metFORMIN (GLUCOPHAGE) 500 MG tablet Take  500 mg by mouth 2 (two) times daily with a meal.    tiotropium (SPIRIVA) 18 MCG inhalation capsule Place 18 mcg into inhaler and inhale daily as needed (shortness of breath).     torsemide (DEMADEX) 20 MG tablet Take 1 tablet (20 mg total) by mouth 2 (two) times daily. Qty: 60 tablet, Refills: 1    traMADol (ULTRAM) 50 MG tablet Take 50-100 mg by mouth every 12 (twelve) hours as needed for moderate pain.   Refills: 5    traZODone (DESYREL) 50 MG tablet Take 50 mg by mouth at bedtime as needed for sleep.     vitamin B-12 (CYANOCOBALAMIN) 1000 MCG tablet Take 1 tablet (1,000 mcg total) by mouth daily. Qty: 30 tablet, Refills: 1    zolpidem (AMBIEN) 5 MG tablet Take 5 mg by mouth at bedtime. Refills: 5    acetaminophen (TYLENOL) 325 MG tablet Take 650 mg by mouth every 6 (six) hours as needed for mild pain, fever or headache.          Brief H and P: For complete details please refer to admission H and P, but in brief Per admit note by Dr. 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 Bobbe Medico with cauterization of AVM, presenting with 3 day history of generalized abdominal pain, intermittent, slightly improved with consumptionof ginger ale and Imodium, as well as dark stools, leak with. The patient reports 3-4 episodes of liquid diarrhea during this presentation. He reports being very weak, fatigued, and he is also reporting chest pain, exertional, without nausea, vomiting or diaphoresis or radiation to the arm. The patient denies any fever, chills, dysuria, frequency, flank pain.  Hospital Course:  GIB (gastrointestinal bleeding) Presented with abdominal pain, dark melanotic stools with extensive GI workup in the past and history of gastric AVMs, chronic blood loss anemia - Patient had extensive GI workup at the time as OP, including EGD and colonoscopy in [id number] remarkable for known erosive gastropathy, angioplastic non bleeding lesion in duodenum, and AVM s/p cauterizations  - FOBT positive, CT abdomen showed no acute or focal lesion, sigmoid diverticulosis - GI was  consulted, recommended no plans of repeating endoscopies, stop Carafate - Follow H&H, resume oral Protonix, blood transfusion as needed - hemoglobin remained stable 10.1 today, GI recommended DC home. Patient would benefit from outpatient CBC checked every month and transfuse as needed. Home health RN was arranged.   Abdominal pain with diarrhea: possibly due to viral gastroenteritis - GI pathogen panel negative, C. difficile negative - tolerating diarrhea, no abdominal pain  Atypical chest pain, known history of CAD - Troponins negative, EKG without evidence of ACS - 2-D echo showed EF of 6065% with grade 1 diastolic dysfunction    Hyperlipidemia - Continue Lipitor, LDL 67    Essential hypertension -BP currently stable     COPD (chronic obstructive pulmonary disease) (HCC) -Currently stable, continue Spiriva, dulera, albuterol as needed      Diastolic HF (heart failure) (HCC) - Currently compensated, euvolemic - 2-D echo showed EF of 60-65% with grade 1 diastolic dysfunction  - continue Demadex    Iron deficiency anemia due to chronic blood loss - Baseline  hemoglobin around 9 - GI consulted, has history of multiple previous blood transfusions, currently H&H stable     Bipolar 1 disorder, mixed, moderate (HCC) with depression, anxiety  - Continue Xanax  as needed, patient was seen to have consistent rude behavior towards the staff, RN and physician.     AKI (acute kidney injury) ([place]) on chronic kidney stage II  - Baseline creatinine 1.1- 1.2, currently in Baseline     Diabetes mellitus with complication (HCC) -  hemoglobin A1c 6.0 on 9/12 continue outpatient insulin regimen    Day of Discharge BP 119/73 (BP Location: Right Arm)   Pulse 93   Temp 98.2 F (36.8 C) (Oral)   Resp 18   Ht 6' 2.5" (1.892 m)   Wt 108.7 kg (239 lb 11.2 oz)   SpO2 97%   BMI 30.36 kg/m   Physical Exam: General: Alert and awake oriented x3 not in any acute distress. HEENT:  anicteric sclera, pupils reactive to light and accommodation CVS: S1-S2 clear no murmur rubs or gallops Chest: clear to auscultation bilaterally, no wheezing rales or rhonchi Abdomen: soft nontender, nondistended, normal bowel sounds Extremities: no cyanosis, clubbing or edema noted bilaterally Neuro: Cranial nerves II-XII intact, no focal neurological deficits   The results of significant diagnostics from this hospitalization (including imaging, microbiology, ancillary and laboratory) are listed below for reference.    LAB RESULTS: Basic Metabolic Panel:  Recent Labs Lab 06/29/17 0753 06/30/17 0539  NA 136 137  K 4.0 4.3  CL 99* 99*  CO2 26 30  GLUCOSE 235* 234*  BUN 16 18  CREATININE 1.25* 1.35*  CALCIUM 8.3* 8.2*   Liver Function Tests:  Recent Labs Lab 06/28/17 0610  AST 14*  ALT 10*  ALKPHOS 140*  BILITOT 0.3  PROT 6.7  ALBUMIN 3.5    Recent Labs Lab 06/28/17 0610  LIPASE 37   No results for input(s): AMMONIA in the last 168 hours. CBC:  Recent Labs Lab 06/28/17 1026  06/29/17 0753 06/30/17 0539  WBC 13.9*  < > 8.0 7.2  NEUTROABS 11.4*  --   --   --   HGB 11.3*  < > 10.7* 10.1*  HCT 36.2*  < > 34.7* 32.2*  MCV 94.8  < > 94.6 93.3  PLT 259  < > 163 155  < > = values in this interval not displayed. Cardiac Enzymes:  Recent Labs Lab 06/28/17 1745 06/28/17 2038  TROPONINI <0.03 <0.03   BNP: Invalid input(s): POCBNP CBG:  Recent Labs Lab 06/29/17 2024 06/30/17 0717  GLUCAP 192* 262*    Significant Diagnostic Studies:  Ct Abdomen Pelvis Wo Contrast  Result Date: 06/28/2017 CLINICAL DATA:  Diffuse abdominal pain extending into the chest for 3 days. Dark colored stools. No abdominal aortic aneurysm. EXAM: CT ABDOMEN AND PELVIS WITHOUT CONTRAST TECHNIQUE: Multidetector CT imaging of the abdomen and pelvis was performed following the standard protocol without IV contrast. COMPARISON:  CT of the abdomen pelvis with contrast 05/08/2017  FINDINGS: Lower chest: Scarring or atelectasis at the lung bases is stable. Coronary artery calcifications are again noted. No significant pleural or pericardial effusion is present. A large hiatal hernia is again noted. Hepatobiliary: No focal liver abnormality is seen. No gallstones, gallbladder wall thickening, or biliary dilatation. Pancreas: Unremarkable. No pancreatic ductal dilatation or surrounding inflammatory changes. Spleen: Normal in size without focal abnormality. Adrenals/Urinary Tract: The adrenal glands are normal bilaterally. There is some stranding about both kidneys without obstruction or focal mass lesion. Ureters  are within normal limits. There are no stones. The urinary bladder is within normal limits. Stomach/Bowel: A large hiatal hernia is present. At least 1/3 of the stomach is above the diaphragm. This is unchanged. The duodenum is normal. The small bowel is within normal limits. The appendix is surgically absent. The ascending and transverse colon are within normal limits. Diverticular changes are present in the descending and sigmoid colon without focal inflammation. The patient is status post laparotomy. Vascular/Lymphatic: Abdominal aortic stent graft is stable in position. There is no evidence for leak on this noncontrast study. No significant adenopathy is present. Reproductive: Prostate is unremarkable. Other: Fat herniates into the inguinal canals bilaterally. There is also fat at the umbilicus. No associated bowel herniation is present. There is no significant adhesions. Musculoskeletal: Facet degenerative changes are present in the lower lumbar spine. Endplate spurring on the left at L5-S1 contributes to moderate left foraminal stenosis. Vertebral body heights are maintained. No focal lytic or blastic lesions are present. Bony pelvis is within normal limits. The hips are unremarkable. IMPRESSION: 1. No acute or focal lesion to explain the patient's symptoms. 2. Sigmoid  diverticulosis without diverticulitis. 3. Stable appearance of aortic stent graft. Aortic aneurysm NOS (ICD10-I71.9). The aneurysm is stable. 4. Degenerative changes of the lower lumbar spine. 5.  Aortic Atherosclerosis (ICD10-I70.0). 6. Stable large hiatal hernia. Electronically Signed   By: San Morelle M.D.   On: 06/28/2017 07:48   Dg Chest 1 View  Result Date: 06/28/2017 CLINICAL DATA:  CHF EXAM: CHEST 1 VIEW COMPARISON:  05/08/2017 FINDINGS: Lower mediastinal widening from a large hiatal hernia based on previous chest CT. Non obscured heart size is likely normal. Status post CABG. No evidence of edema, effusion, or pneumothorax. Borderline hyperinflation. Patient has history of COPD. Limited portable study, with EKG leads crossing the chest. Remote right rib fracture. IMPRESSION: 1. Limited portable study without acute finding. 2. Large hiatal hernia. Electronically Signed   By: Monte Fantasia M.D.   On: 06/28/2017 13:15    2D ECHO:   Disposition and Follow-up: Discharge Instructions    Diet Carb Modified    Complete by:  As directed    Increase activity slowly    Complete by:  As directed        DISPOSITION: home    [place]    [name], MD. Schedule an appointment as soon as possible for a visit in 2 week(s).   Specialty:  Internal Medicine Contact information: [place], [place] [address] [phone]        Doran Stabler, MD. Schedule an appointment as soon as possible for a visit in 2 week(s).   Specialty:  Gastroenterology Contact information: [address] [place] [address] [phone]            Time spent on Discharge: 51mins   Signed:   Estill Cotta M.D. [facility] 06/30/2017, 10:35 AM Pager: [phone]

## 2017-06-30 NOTE — Progress Notes (Signed)
Pt tech states pt refusing blood sugar check and vital signs.

## 2017-06-30 NOTE — Progress Notes (Signed)
Pt wishes to ensure he has a prescription for Tramadol upon discharge.

## 2017-06-30 NOTE — Progress Notes (Signed)
Pt discharged via wheelchair with nurse tech on oxygen, pt states his ride has his portable oxygen. Pt has his printed AVS and printed prescription RN stated hope you feel better, pt states "I hope I die soon"  RN asked pt what he meant, pt stated "it is none of your business"  Pt states that hemoglobin is not being monitored closely when discharged, that MD does not know him and his history  Informed pt that home health RN can draw blood, pt states that is not good enough  Informed pt on signs and symptoms of GI bleed and when to call primary care or come to the ED

## 2017-06-30 NOTE — Progress Notes (Signed)
GI stated okay to discharge pt Paged MD Salome Arnt to inform

## 2017-06-30 NOTE — Progress Notes (Signed)
Pt continues to be very verbally abusive and disrespectful towards multiple members of the interdisciplinary team (RN, NT, phlebotomist this AM). This RN spoke with the patient and unsuccessfully encouraged mutual respect. Will continue to monitor.
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
## 2017-07-03 ENCOUNTER — Telehealth: Payer: Self-pay | Admitting: *Deleted

## 2017-07-03 LAB — CULTURE, BLOOD (ROUTINE X 2)
Culture: NO GROWTH
Culture: NO GROWTH
SPECIAL REQUESTS: ADEQUATE
Special Requests: ADEQUATE
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
## 2017-07-13 ENCOUNTER — Ambulatory Visit: Payer: Medicare HMO | Admitting: Physician Assistant

## 2017-08-16 ENCOUNTER — Telehealth: Payer: Self-pay | Admitting: Pulmonary Disease

## 2017-08-16 NOTE — Telephone Encounter (Signed)
Called pt and advised message from the provider. Pt understood and verbalized understanding. Nothing further is needed.    

## 2017-08-16 NOTE — Telephone Encounter (Signed)
Spoke with the pt  He is calling just as an FYI to let us know why he has not been in lately  He states that his spouse was dxed with colon cancer, and has had surgery and chemo to start soon  He is busy taking care of her, and he himself has also been hospitalized recently with GI issues  He wants Dr Lake Bells know that he is doing well as far as his breathing goes, and he very much appreciates the care that he provides  I thanked him for calling to let us know, and advised him once he is back to his normal routine to call for f/u if he can I advised that we are here if he needs anything and will let BQ know that he called today

## 2017-08-16 NOTE — Telephone Encounter (Signed)
Tell him I'm sorry to hear this and we are always happy to help as needed.

## 2017-09-26 IMAGING — CR DG CHEST 2V
2 series · 2 of 2 positions shown · non-contrast
Comparison: 11/07/2016

CLINICAL DATA: Chest pain.

EXAM:
CHEST  2 VIEW

[chest lat]
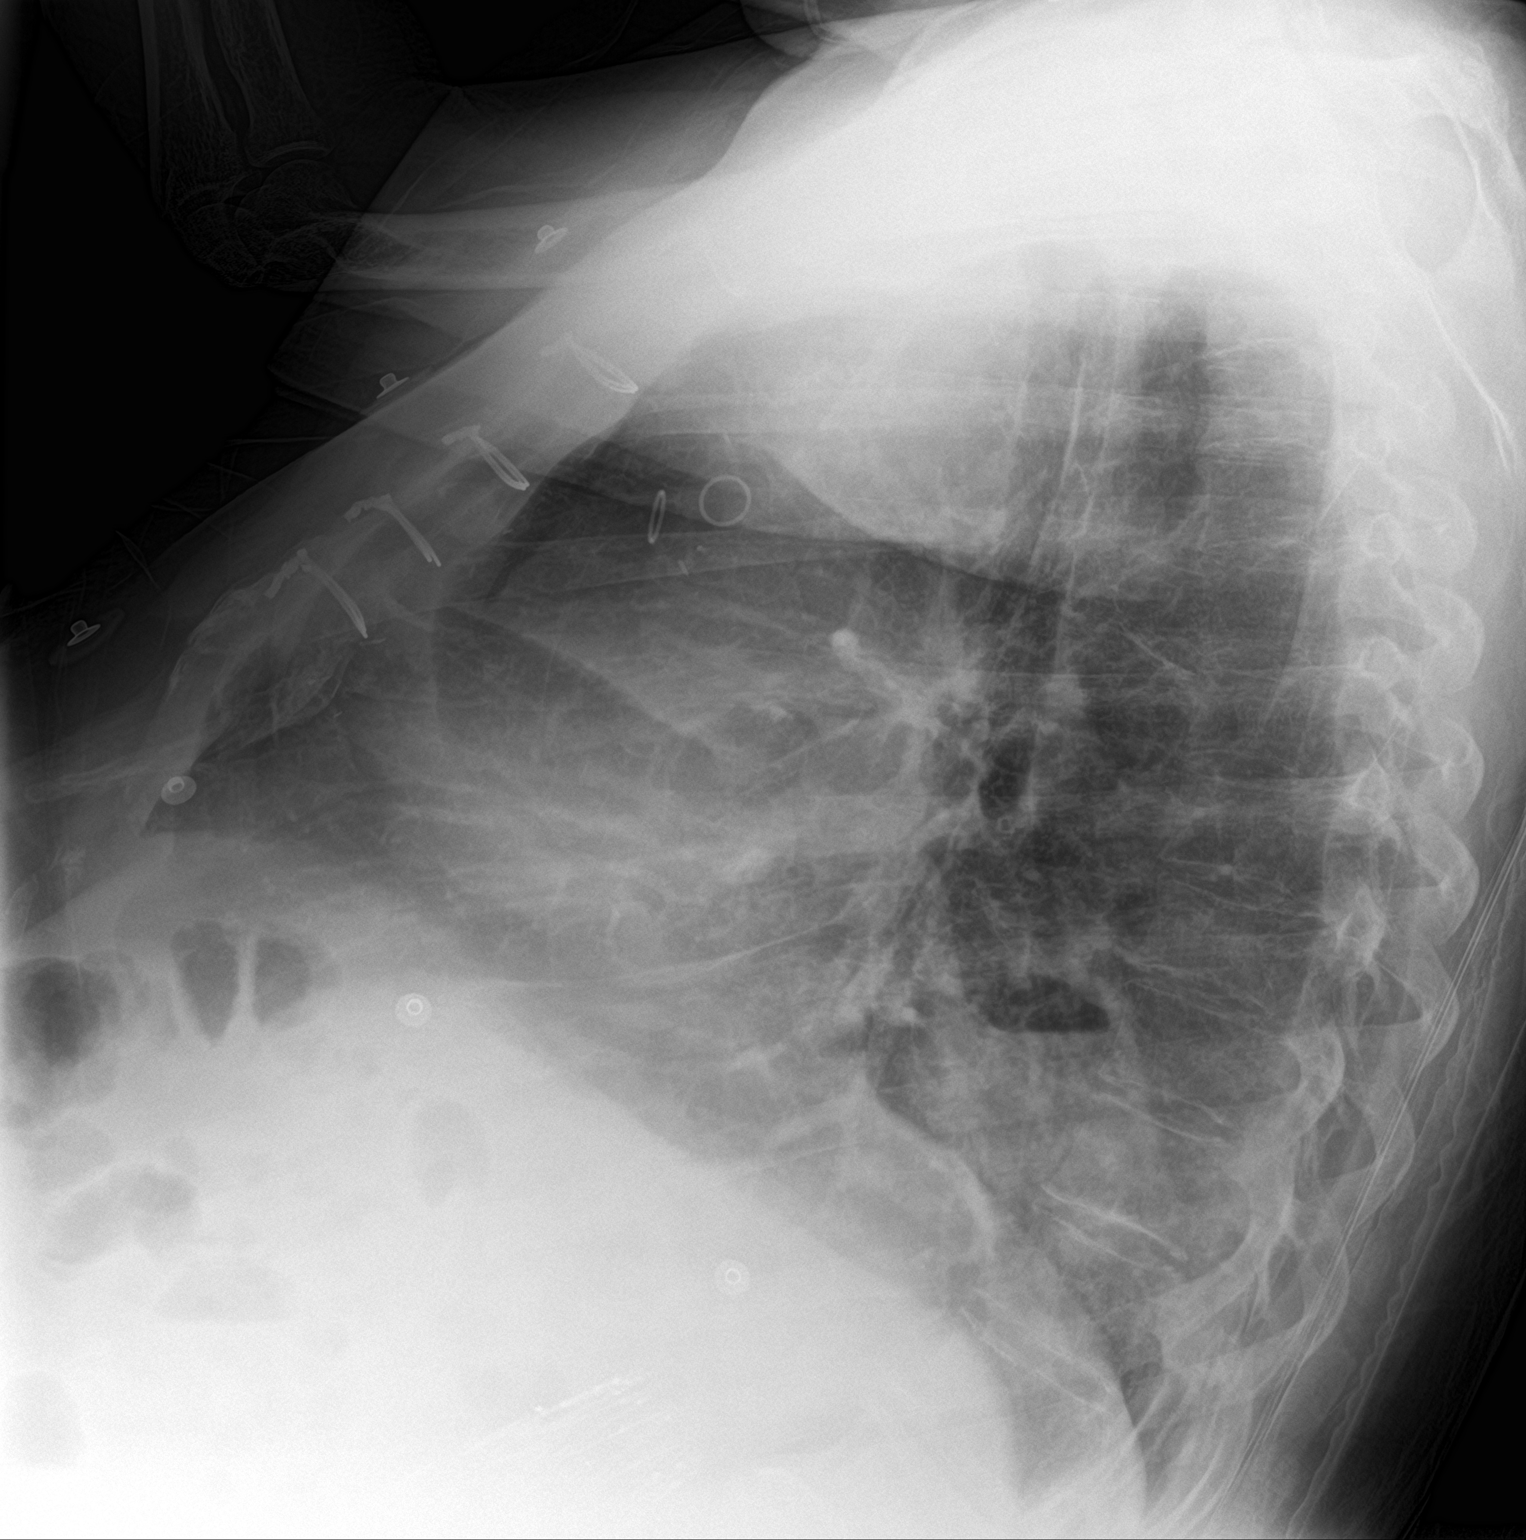

[chest ap]
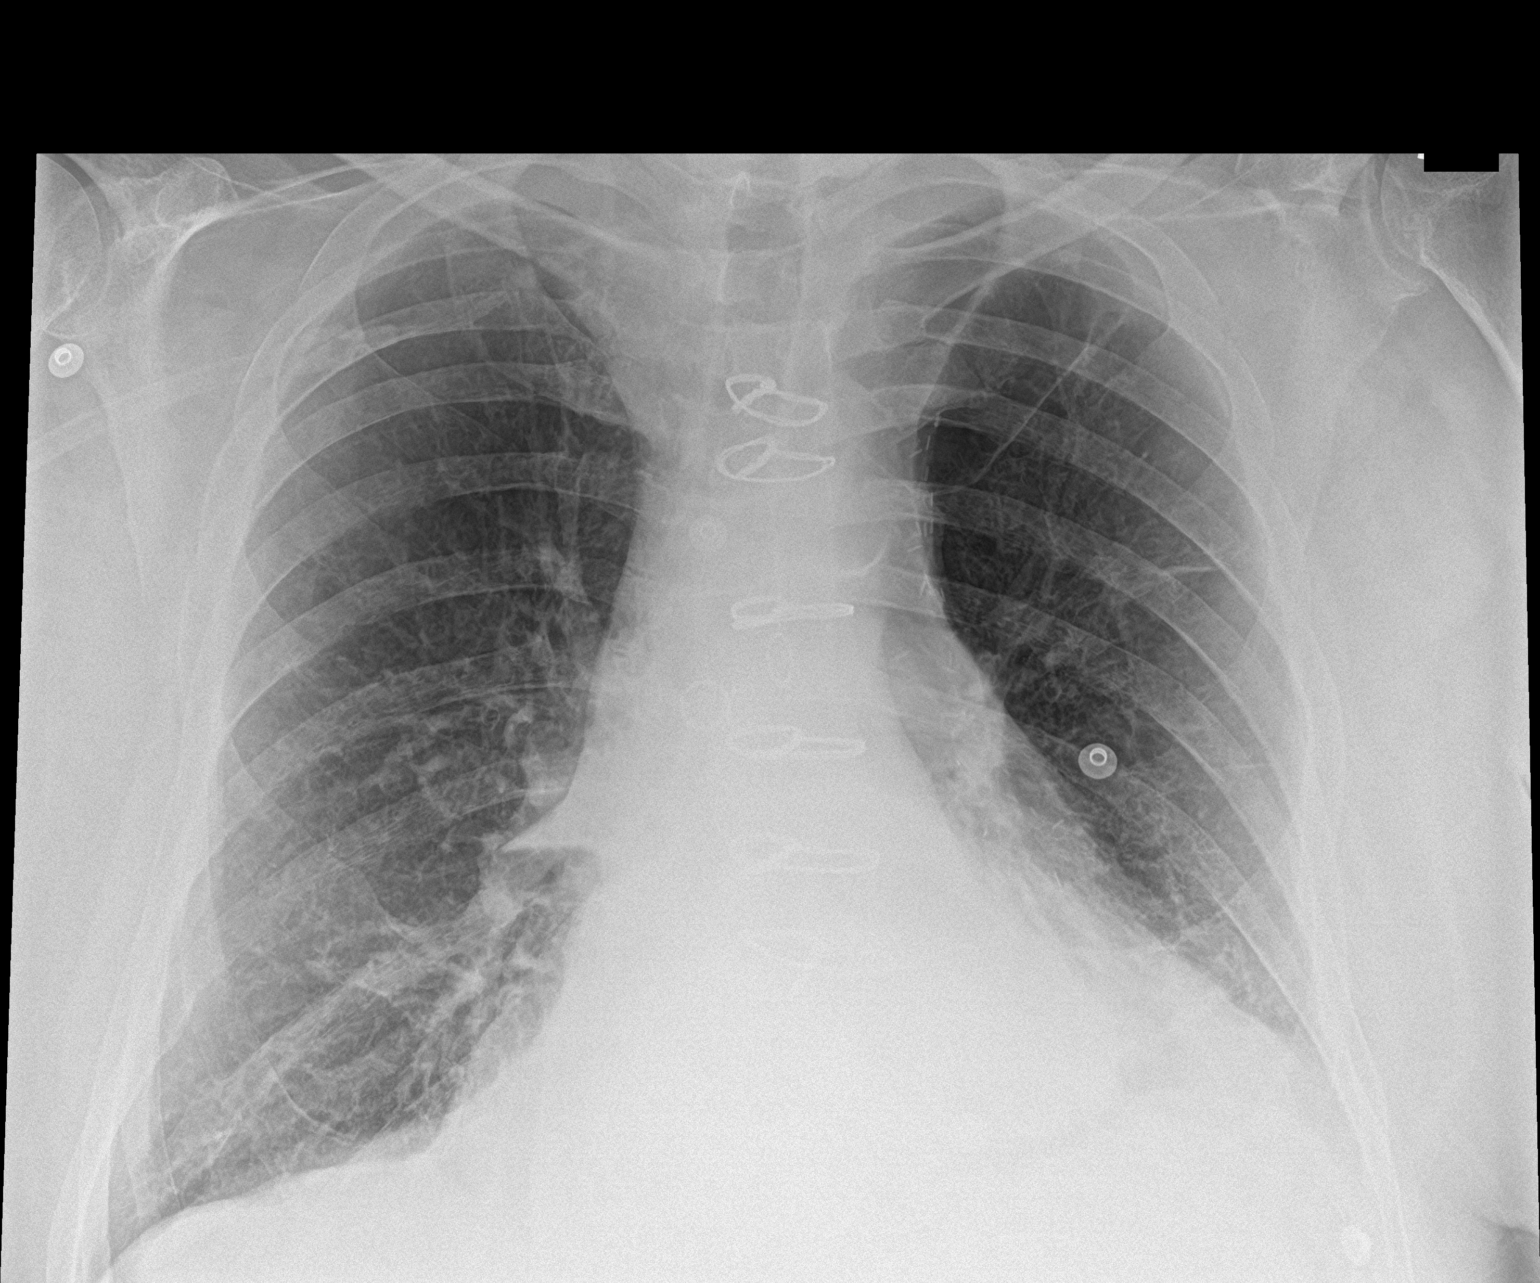

[2 of 2 positions shown; findings below may reference images not displayed]

FINDINGS: Previous median sternotomy and CABG procedure. Normal heart size. No
pleural effusion or edema. The large hiatal hernia identified.
Similar appearance of coarsened interstitial markings within both
lungs which is suggestive of emphysema.
IMPRESSION: 1. No acute cardiopulmonary abnormality.
2.  Emphysema ([id number]).
3. Hiatal hernia.

## 2017-11-01 IMAGING — CR DG CHEST 2V
3 series · 3 of 3 positions shown · non-contrast
Comparison: 02/22/2017

CLINICAL DATA: Shortness of breath

EXAM:
CHEST  2 VIEW

[w chest lat]
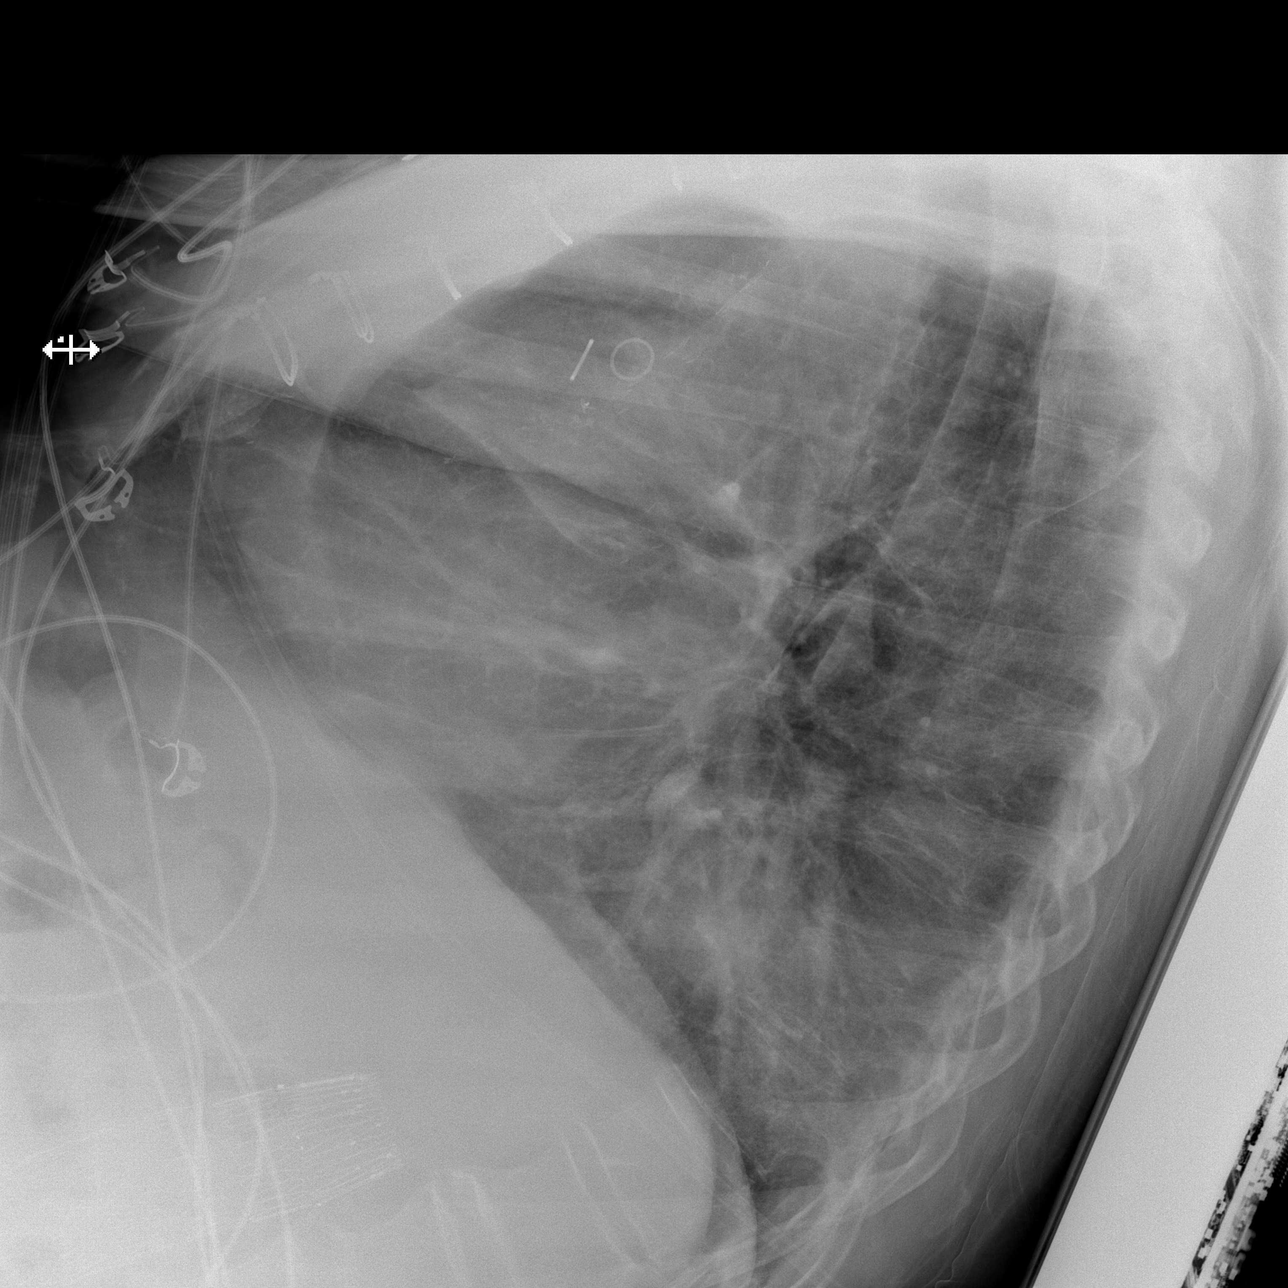

[x chest ap (1 of 2)]
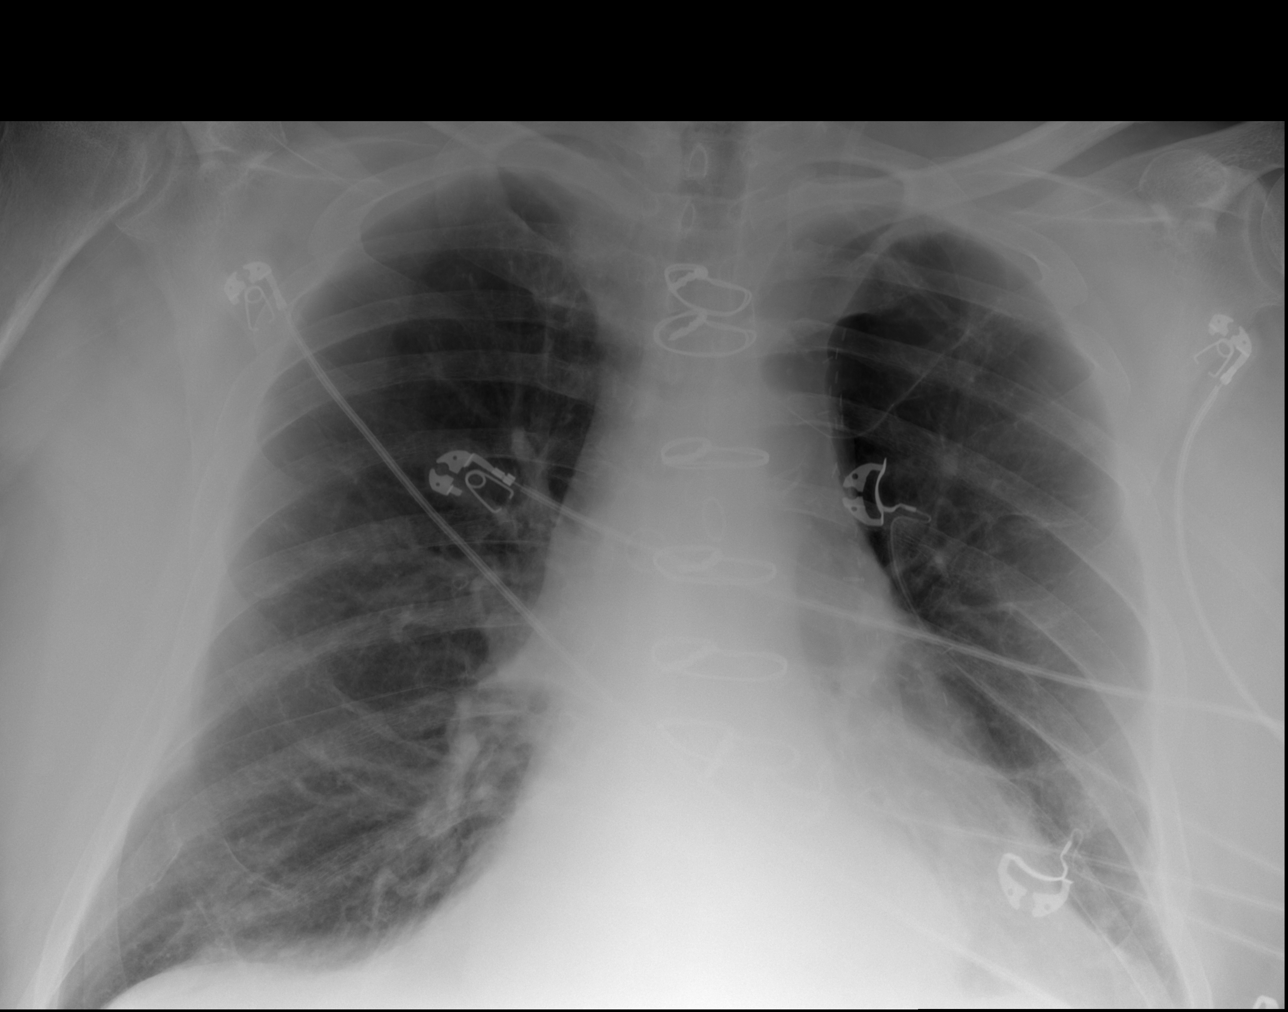

[x chest ap (2 of 2)]
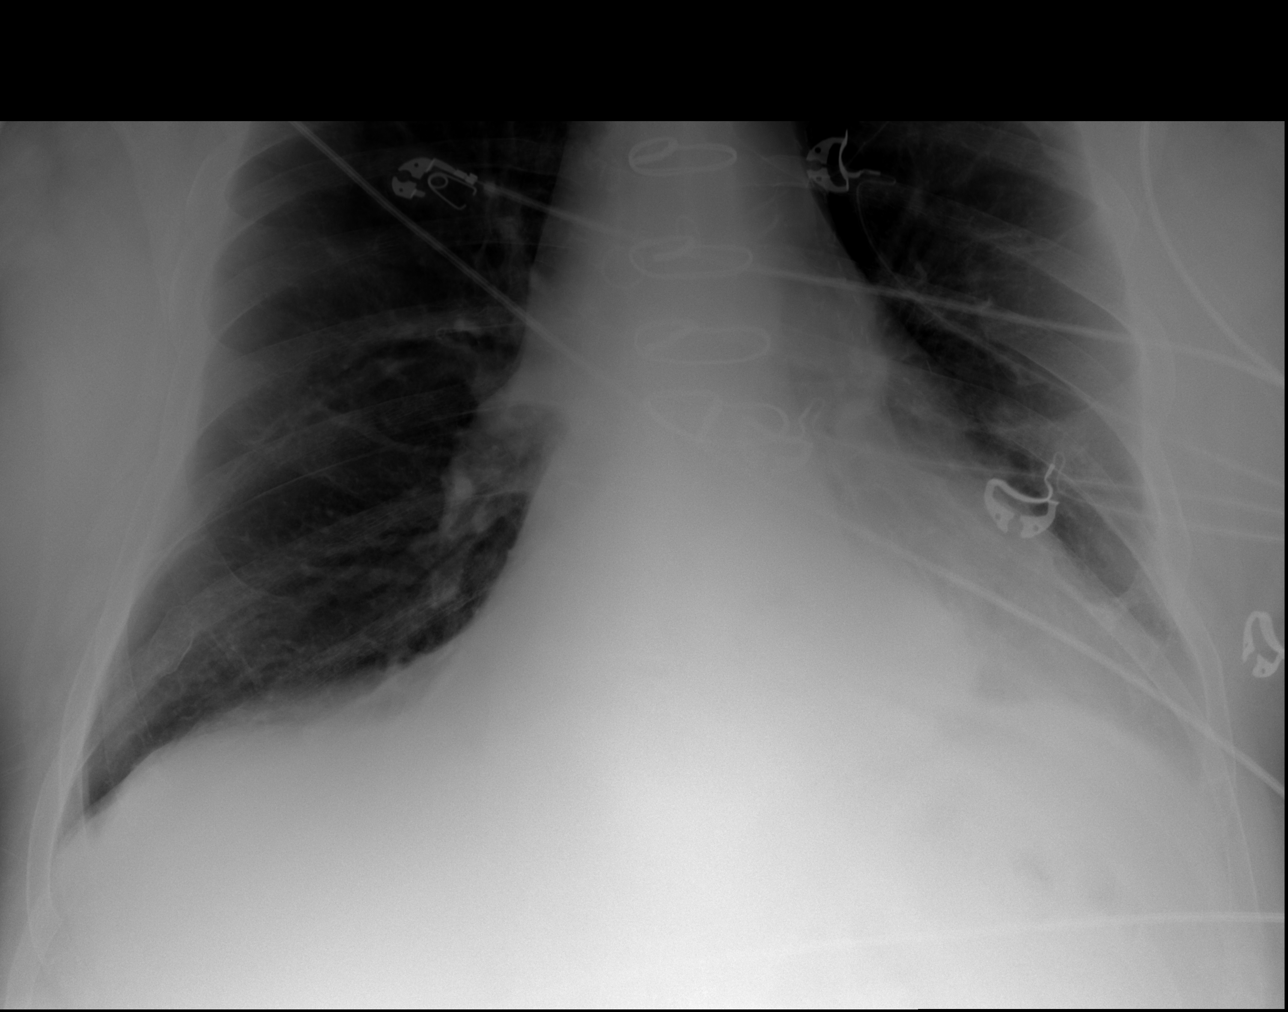

[3 of 3 positions shown; findings below may reference images not displayed]

FINDINGS: Previous median sternotomy and CABG procedure. Normal heart size. No
pleural effusion or edema identified. No airspace consolidation
identified.
IMPRESSION: 1. No acute cardiopulmonary abnormalities.
2. Hiatal hernia.

## 2017-11-05 IMAGING — DX DG ABDOMEN 1V
2 series · 2 of 2 positions shown · non-contrast
Comparison: CT abdomen pelvis 04/22/2015. CT abdomen and pelvis
12/29/2014, 05/19/2014 and earlier.

CLINICAL DATA: Intermittent left lower quadrant abdominal pain.

EXAM:
ABDOMEN - 1 VIEW

[abdomen kub (1 of 2)]
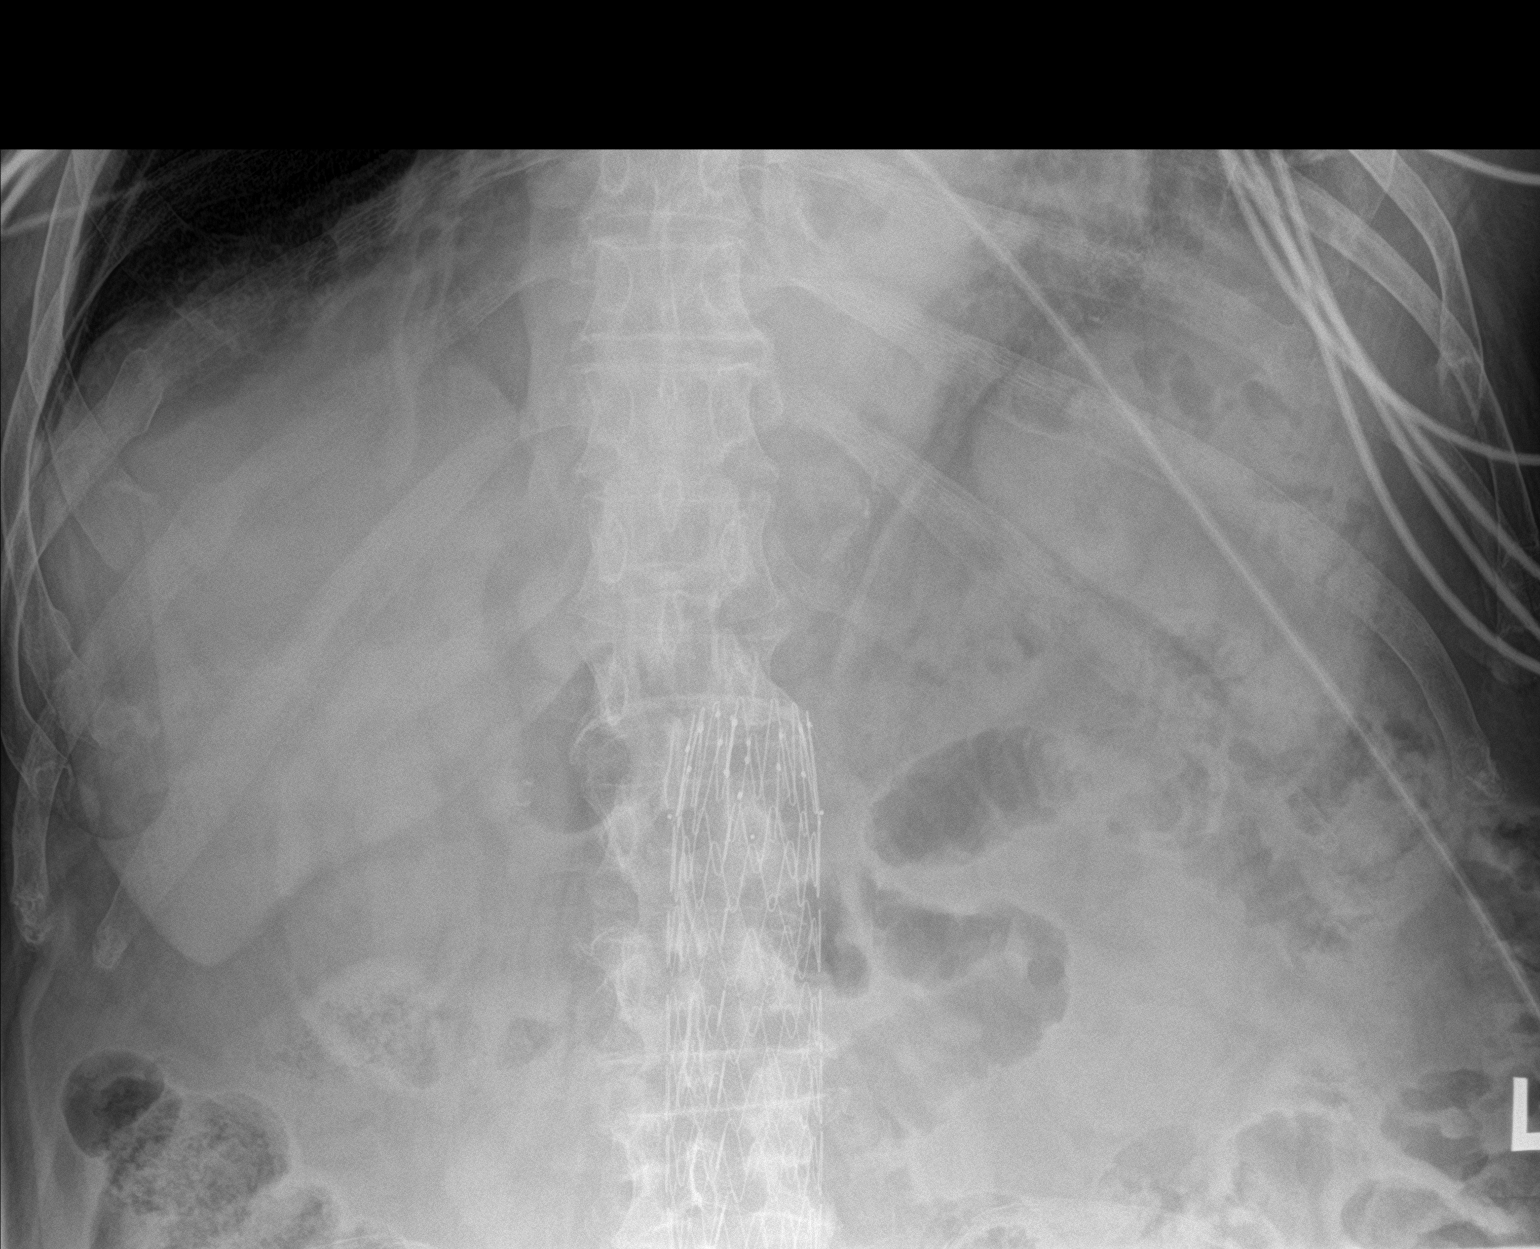

[abdomen kub (2 of 2)]
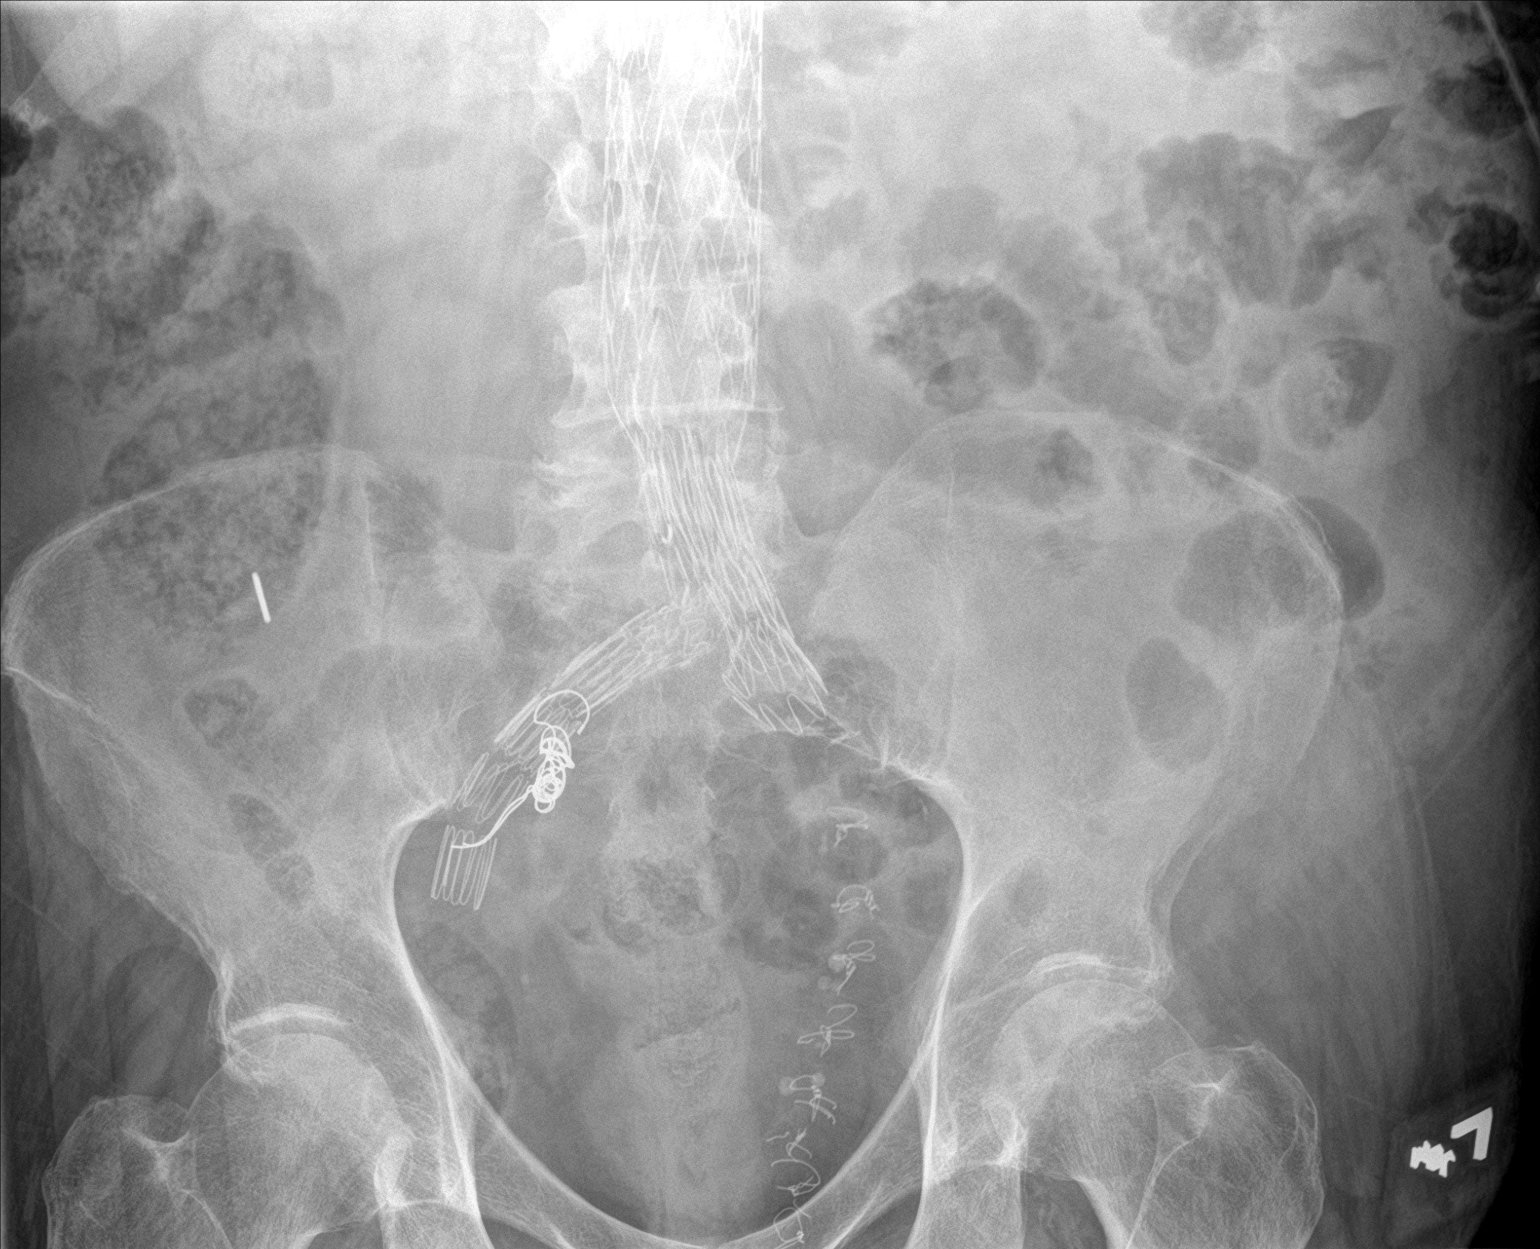

[2 of 2 positions shown; findings below may reference images not displayed]

FINDINGS: Bowel gas pattern unremarkable without evidence of obstruction or
significant ileus. Moderate stool burden in the colon. No visible
opaque urinary tract calculi. Prior aorto iliac stent grafting and
coil embolization of the right internal iliac artery. Surgical
suture material in the pelvis.
IMPRESSION: No acute abdominal abnormality.

## 2017-11-05 IMAGING — DX DG CHEST 2V
2 series · 2 of 2 positions shown · non-contrast
Comparison: Radiographs March 30, 2017.

CLINICAL DATA: Shortness of breath.

EXAM:
CHEST  2 VIEW

[chest lat]
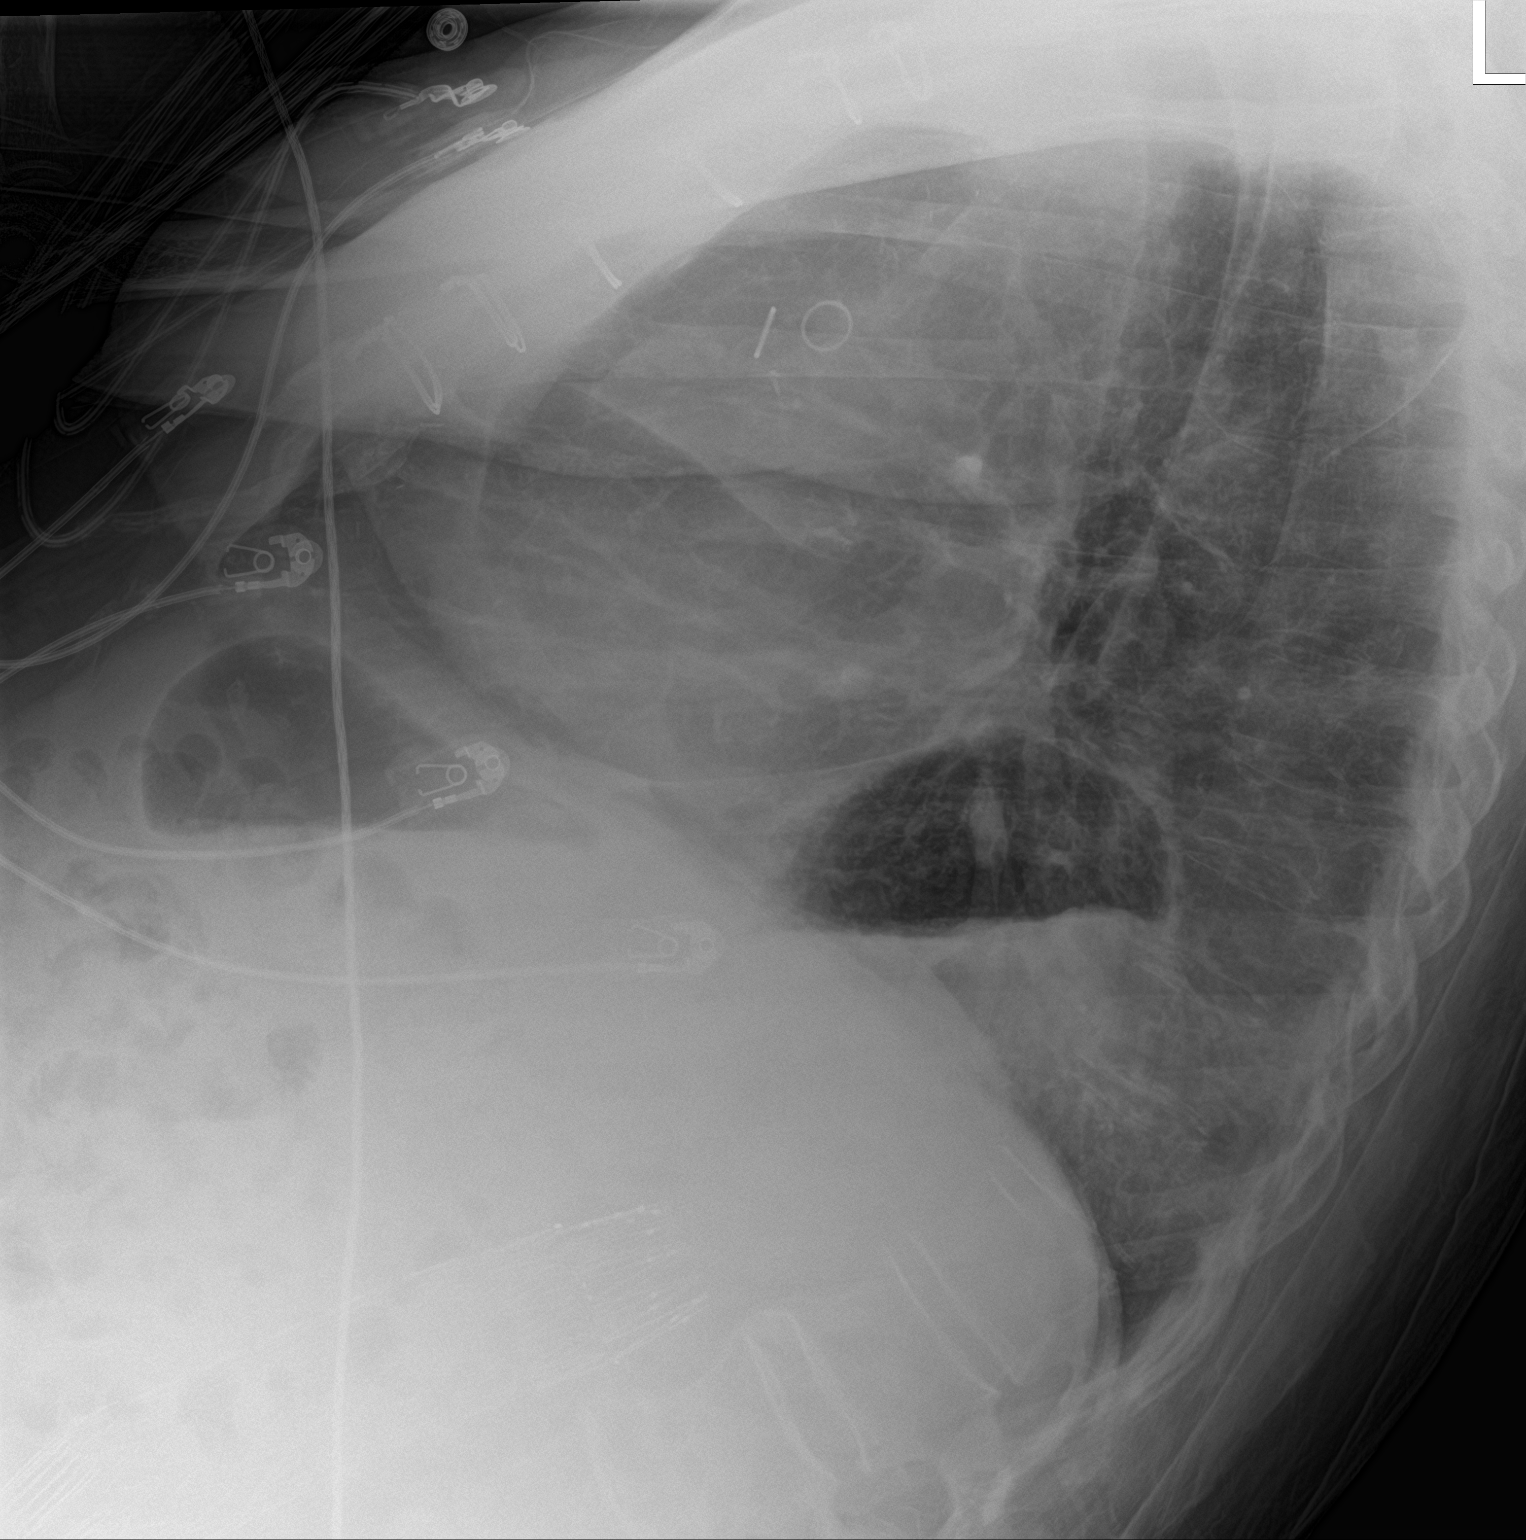

[chest ap]
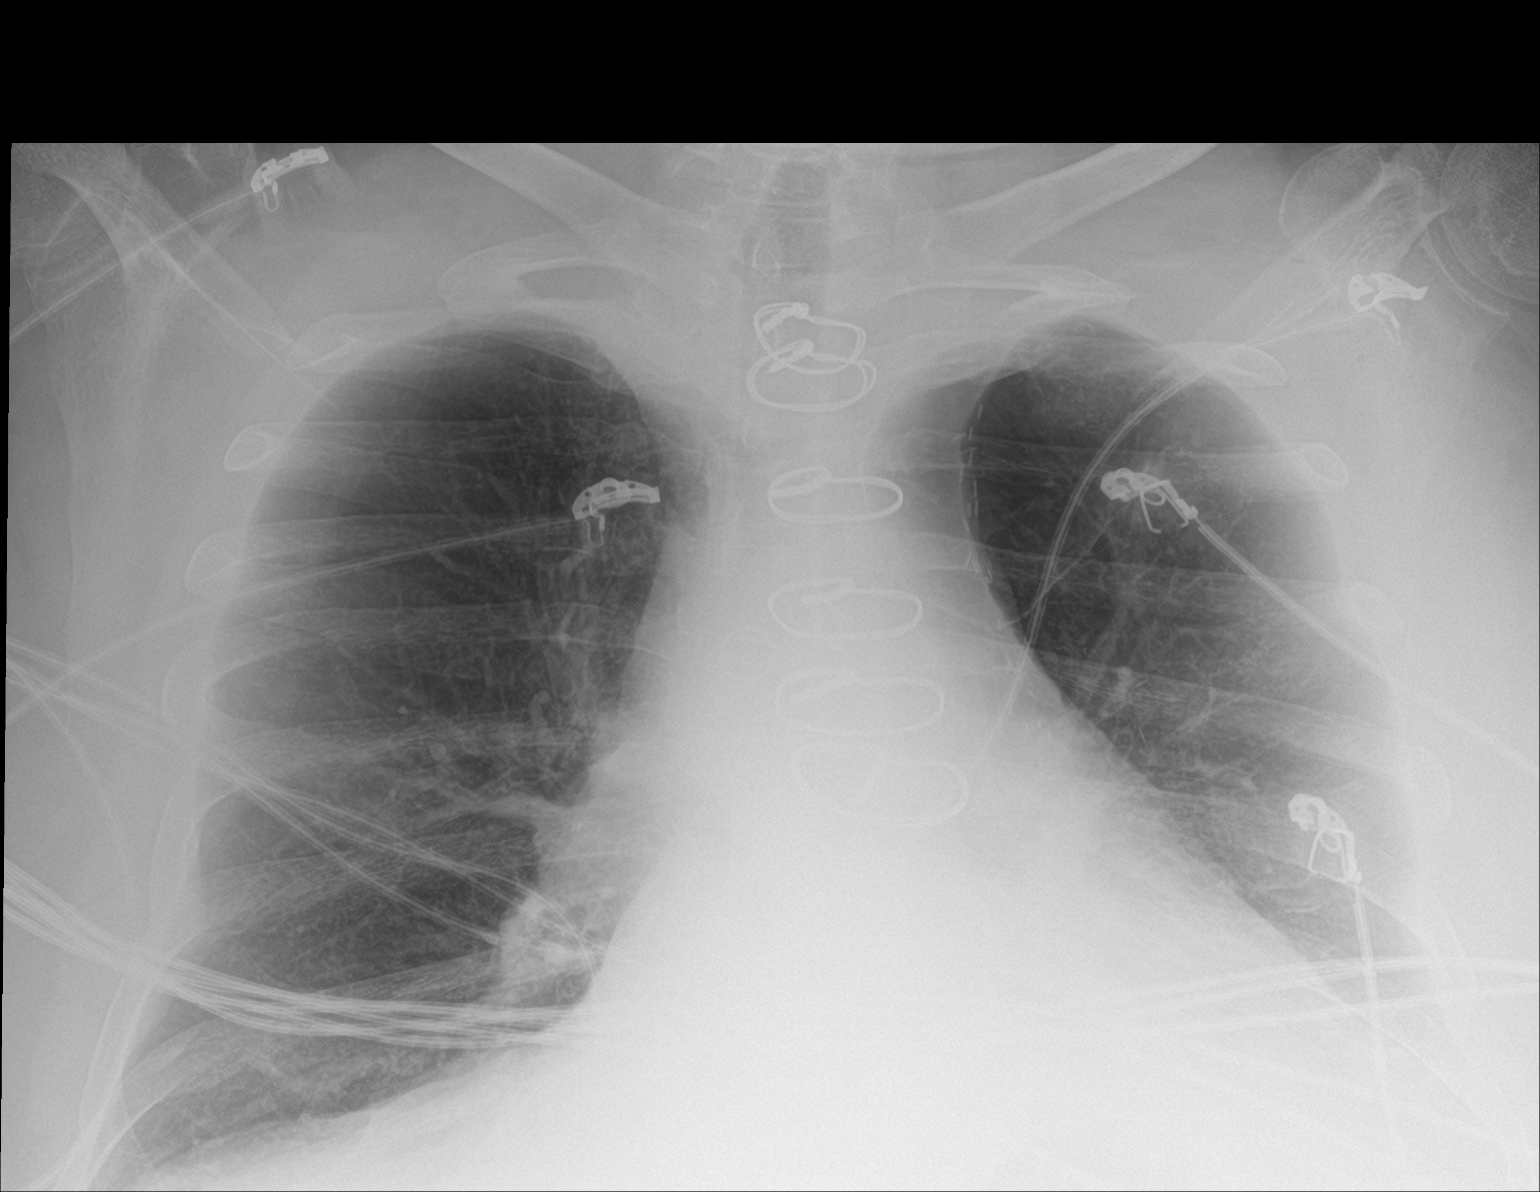

[2 of 2 positions shown; findings below may reference images not displayed]

FINDINGS: The heart size and mediastinal contours are within normal limits.
Both lungs are clear. Status post coronary artery bypass graft. No
pneumothorax or pleural effusion is noted. Large hiatal hernia is
noted. The visualized skeletal structures are unremarkable.
IMPRESSION: No active cardiopulmonary disease.  Large hiatal hernia.

## 2017-12-10 IMAGING — CT CT ABD-PELV W/ CM
3 of 11 series · 11 of 46 positions shown, 17 images · IV contrast (APPLIED)
Comparison: CTA chest dated 07/01/2015. CT abdomen/ pelvis dated
04/22/2015.

CLINICAL DATA: Shortness of breath, abdominal pain, leukocytosis

EXAM:
CT ANGIOGRAPHY CHEST
CT ABDOMEN AND PELVIS WITH CONTRAST
TECHNIQUE: Multidetector CT imaging of the chest was performed using the
standard protocol during bolus administration of intravenous
contrast. Multiplanar CT image reconstructions and MIPs were
obtained to evaluate the vascular anatomy. Multidetector CT imaging
of the abdomen and pelvis was performed using the standard protocol
during bolus administration of intravenous contrast.
CONTRAST:  100 mL Isovue 370

[Series 8: thins · axial · 0.87mm/px · z∈[+1190,+1366]mm · 6 of 283 slices shown]
[im 18/283  soft-tissue]
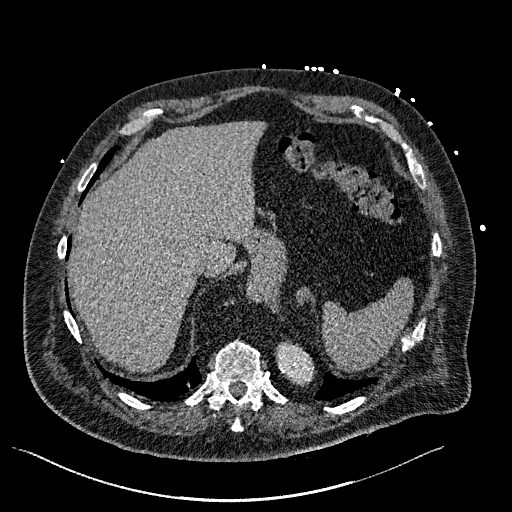
[im 53/283  soft-tissue]
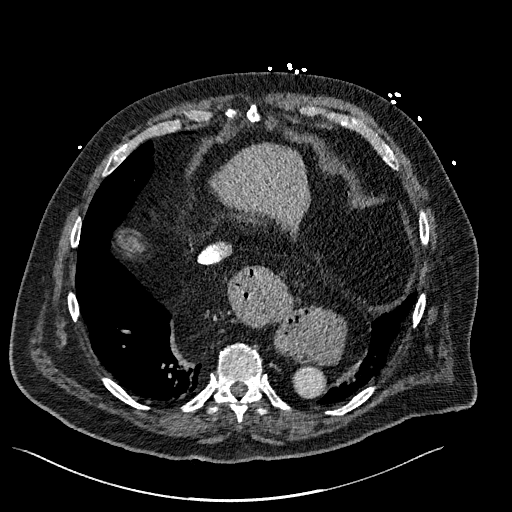
[im 89/283  soft-tissue]
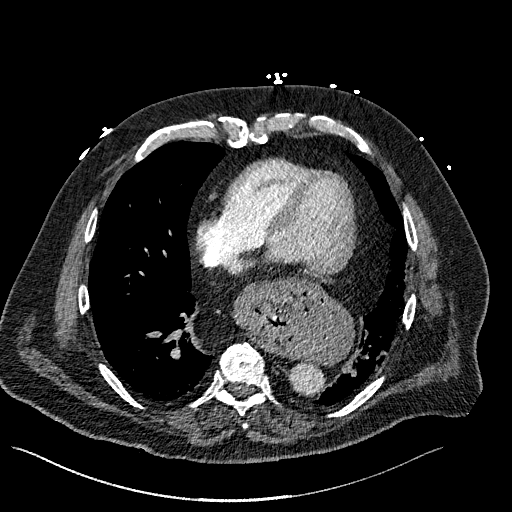
[im 124/283  soft-tissue]
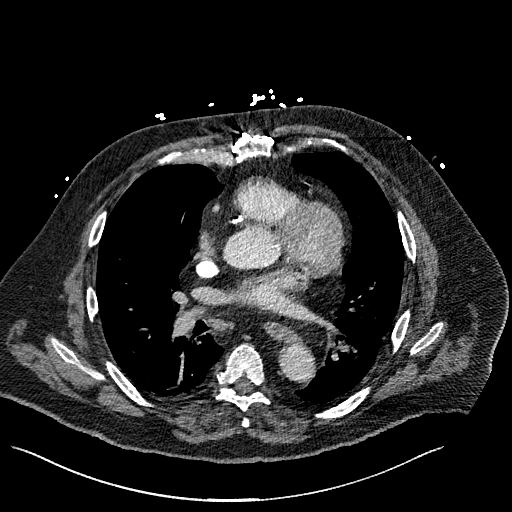
[im 159/283  soft-tissue]
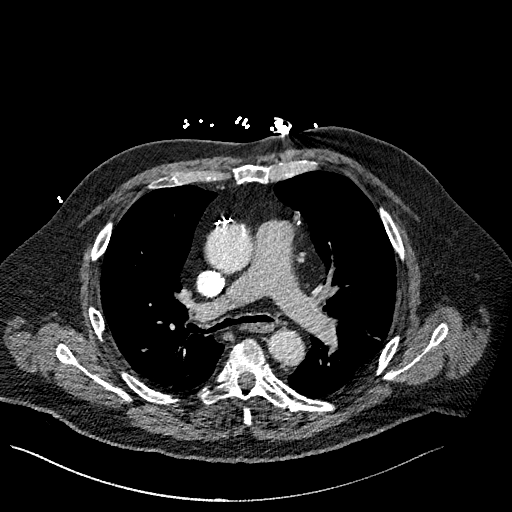
[im 194/283  soft-tissue]
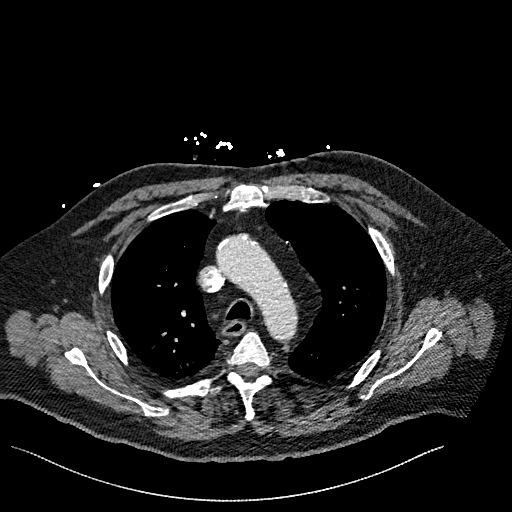

[Series 10: coronal mpr · coronal · 0.59mm/px · 1 of 101 slices shown, 2 images]
[im 51/101  soft-tissue]
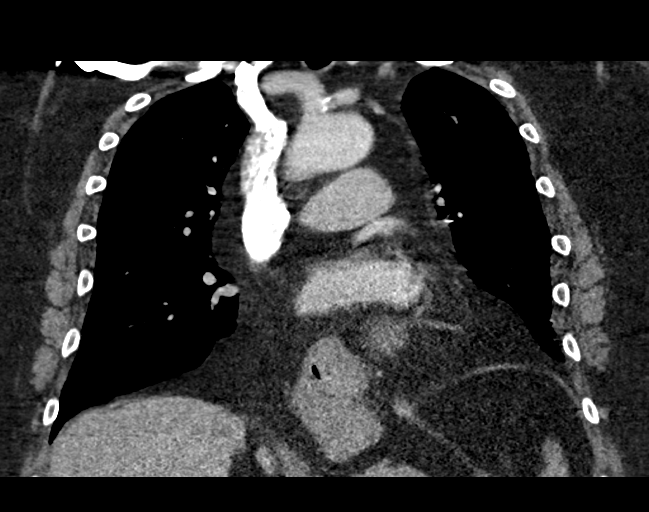
[im 51/101  bone]
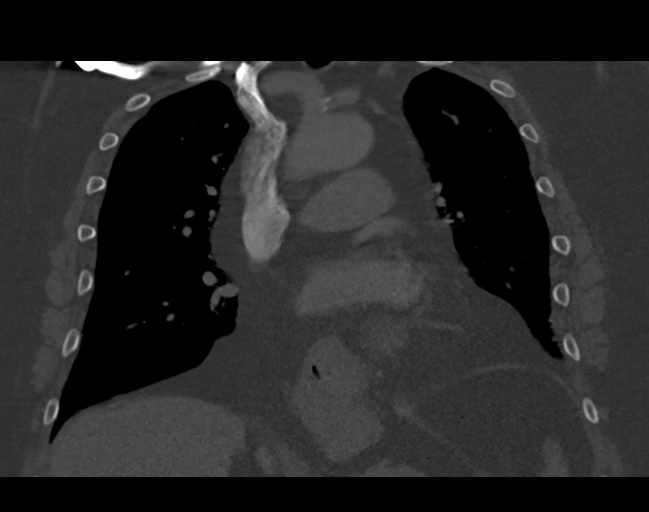

[Series 14: abd/ pelvis 5.0 i30f 1 · axial · 0.86mm/px · z∈[+862,+1176]mm · 4 of 105 slices shown, 9 images]
[im 21/105  soft-tissue]
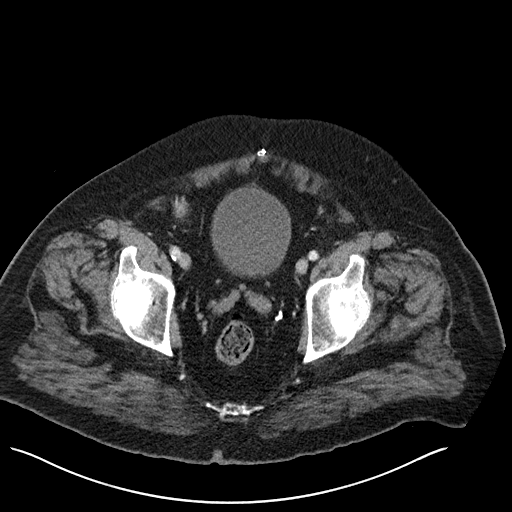
[im 21/105  lung]
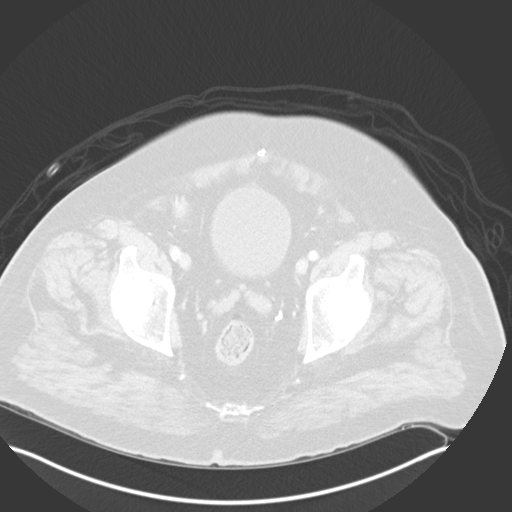
[im 21/105  bone]
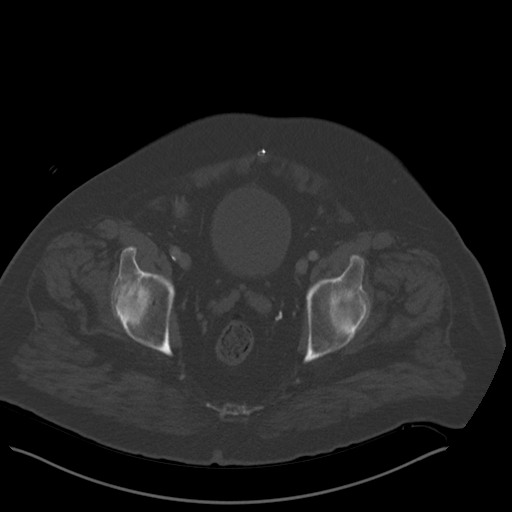
[im 42/105  soft-tissue]
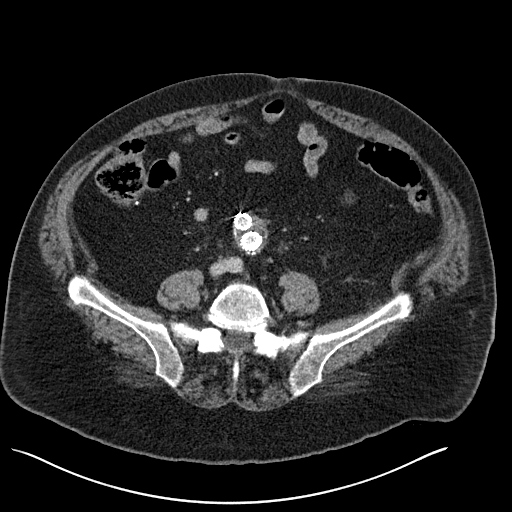
[im 42/105  lung]
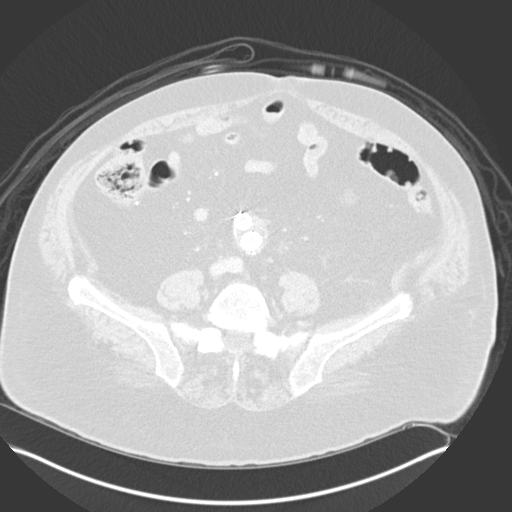
[im 63/105  soft-tissue]
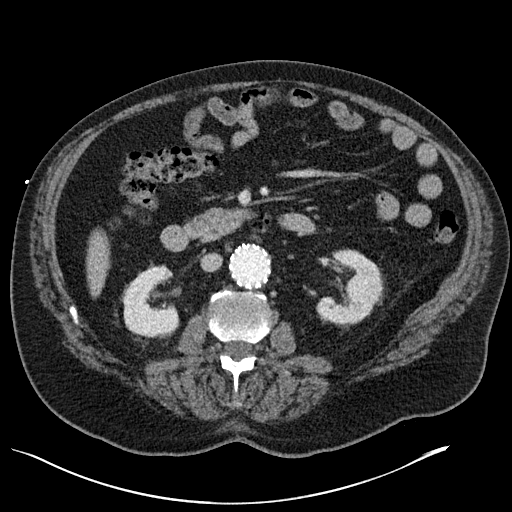
[im 63/105  lung]
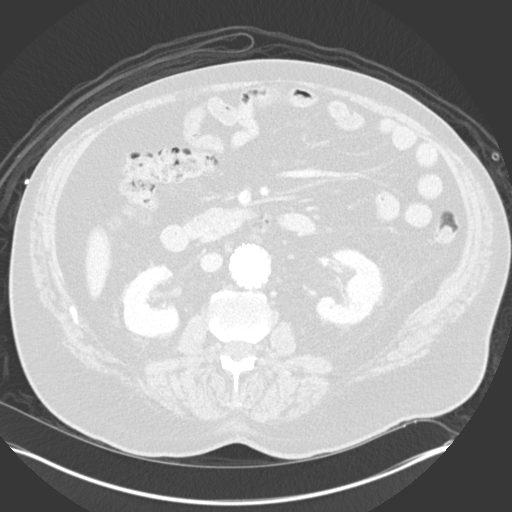
[im 84/105  soft-tissue]
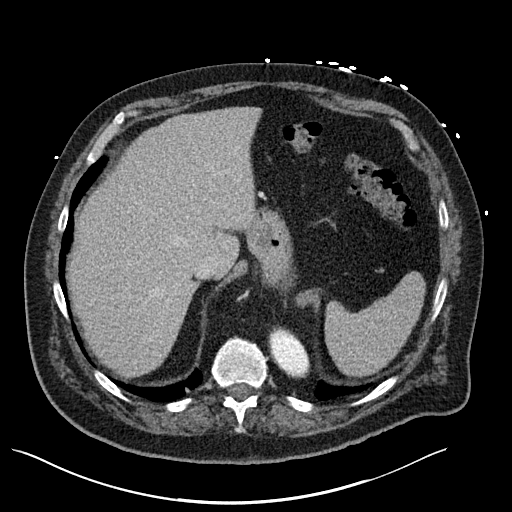
[im 84/105  lung]
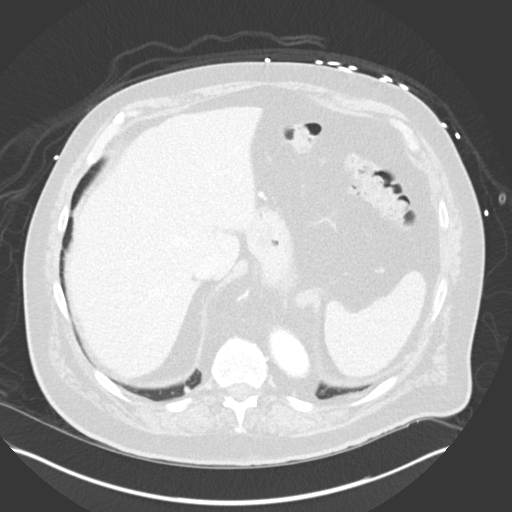

[11 of 46 positions shown; findings below may reference images not displayed]

FINDINGS: CTA CHEST FINDINGS

Cardiovascular: Suboptimal contrast opacification of the pulmonary
arteries due to limited rate of injection (affecting contrast
bolus), secondary to poor IV access. No evidence of central
pulmonary embolism to the lobar level. Segmental and subsegmental
pulmonary emboli cannot be evaluated.

No evidence of thoracic aortic aneurysm or dissection.
Atherosclerotic calcifications of the aortic arch.

The heart is top-normal in size.  No pericardial effusion.

Coronary atherosclerosis the LAD and right coronary artery.
Postsurgical changes related to prior CABG.

Mediastinum/Nodes: No suspicious mediastinal lymphadenopathy.

Visualized thyroid is unremarkable.

Lungs/Pleura: Bullous cavity in the left lung apex.

Mild dependent atelectasis in the bilateral lower lobes.

No suspicious pulmonary nodules.

No focal consolidation.

No pleural effusion or pneumothorax.

Musculoskeletal: Thoracic spine is within normal limits.

Median sternotomy.

Review of the MIP images confirms the above findings.

CT ABDOMEN and PELVIS FINDINGS

Hepatobiliary: Liver is within normal limits.

Gallbladder is within normal limits. No intrahepatic or extrahepatic
ductal dilatation.

Pancreas: Within normal limits.

Spleen: Within normal limits.

Adrenals/Urinary Tract: Adrenal glands within normal limits.

9 mm right lower pole renal cyst (series 14/ image 45). Segmental
scarring/atrophy along the right lower pole. Left kidney is within
normal limits. No hydronephrosis.

Bladder is within normal limits.

Stomach/Bowel: Stomach is notable for a large hiatal hernia/inverted
intrathoracic stomach.

No evidence of bowel obstruction.

Status post appendectomy.

Sigmoid diverticulosis, without evidence of diverticulitis.

No colonic wall thickening or mass is evident on CT.

Vascular/Lymphatic: 5.2 x 5.2 cm infrarenal abdominal aortic
aneurysm (series 14/image 55) with indwelling aorto bi-iliac stent,
previously 5 1 x 5.1 cm.

No suspicious abdominopelvic lymphadenopathy.

Reproductive: Prostate is unremarkable.

Other: No abdominopelvic ascites.

Small fat containing right inguinal hernia.

Musculoskeletal: Mild degenerative changes of the lumbar spine.

Review of the MIP images confirms the above findings.
IMPRESSION: Suboptimal contrast opacification of the pulmonary arteries. No
evidence of central pulmonary embolism to the lobar level. Segmental
and subsegmental pulmonary emboli cannot be evaluated.

5.2 cm infrarenal abdominal aortic aneurysm status post aorto
bi-iliac stent, grossly unchanged.

Sigmoid diverticulosis, without evidence of diverticulitis.

Additional ancillary findings as above.

Aortic Atherosclerosis ([id number]) and Emphysema ([id number].P).

## 2018-01-18 ENCOUNTER — Inpatient Hospital Stay
Admission: EM | Admit: 2018-01-18 | Discharge: 2018-01-20 | DRG: 378 | Disposition: A | Discharge status: Home / Self Care | Payer: Medicare Other | Attending: Specialist | Admitting: Specialist

## 2018-01-18 ENCOUNTER — Encounter: Payer: Self-pay | Admitting: Emergency Medicine

## 2018-01-18 ENCOUNTER — Other Ambulatory Visit: Payer: Self-pay

## 2018-01-18 DIAGNOSIS — K921 Melena: Secondary | ICD-10-CM | POA: Diagnosis not present

## 2018-01-18 DIAGNOSIS — Q2733 Arteriovenous malformation of digestive system vessel: Secondary | ICD-10-CM

## 2018-01-18 DIAGNOSIS — I10 Essential (primary) hypertension: Secondary | ICD-10-CM | POA: Diagnosis present

## 2018-01-18 DIAGNOSIS — I2510 Atherosclerotic heart disease of native coronary artery without angina pectoris: Secondary | ICD-10-CM | POA: Diagnosis not present

## 2018-01-18 DIAGNOSIS — Z794 Long term (current) use of insulin: Secondary | ICD-10-CM

## 2018-01-18 DIAGNOSIS — F419 Anxiety disorder, unspecified: Secondary | ICD-10-CM | POA: Diagnosis not present

## 2018-01-18 DIAGNOSIS — E1140 Type 2 diabetes mellitus with diabetic neuropathy, unspecified: Secondary | ICD-10-CM | POA: Diagnosis present

## 2018-01-18 DIAGNOSIS — D638 Anemia in other chronic diseases classified elsewhere: Secondary | ICD-10-CM | POA: Diagnosis present

## 2018-01-18 DIAGNOSIS — I252 Old myocardial infarction: Secondary | ICD-10-CM

## 2018-01-18 DIAGNOSIS — I5032 Chronic diastolic (congestive) heart failure: Secondary | ICD-10-CM | POA: Diagnosis present

## 2018-01-18 DIAGNOSIS — K219 Gastro-esophageal reflux disease without esophagitis: Secondary | ICD-10-CM | POA: Diagnosis not present

## 2018-01-18 DIAGNOSIS — J449 Chronic obstructive pulmonary disease, unspecified: Secondary | ICD-10-CM | POA: Diagnosis present

## 2018-01-18 DIAGNOSIS — Z9981 Dependence on supplemental oxygen: Secondary | ICD-10-CM | POA: Diagnosis not present

## 2018-01-18 DIAGNOSIS — F3162 Bipolar disorder, current episode mixed, moderate: Secondary | ICD-10-CM | POA: Diagnosis not present

## 2018-01-18 DIAGNOSIS — Z79899 Other long term (current) drug therapy: Secondary | ICD-10-CM

## 2018-01-18 DIAGNOSIS — Z951 Presence of aortocoronary bypass graft: Secondary | ICD-10-CM

## 2018-01-18 DIAGNOSIS — I110 Hypertensive heart disease with heart failure: Secondary | ICD-10-CM | POA: Diagnosis not present

## 2018-01-18 DIAGNOSIS — E119 Type 2 diabetes mellitus without complications: Secondary | ICD-10-CM | POA: Diagnosis present

## 2018-01-18 DIAGNOSIS — J9610 Chronic respiratory failure, unspecified whether with hypoxia or hypercapnia: Secondary | ICD-10-CM | POA: Diagnosis present

## 2018-01-18 DIAGNOSIS — E785 Hyperlipidemia, unspecified: Secondary | ICD-10-CM | POA: Diagnosis not present

## 2018-01-18 DIAGNOSIS — J439 Emphysema, unspecified: Secondary | ICD-10-CM | POA: Diagnosis present

## 2018-01-18 DIAGNOSIS — K922 Gastrointestinal hemorrhage, unspecified: Secondary | ICD-10-CM | POA: Diagnosis present

## 2018-01-18 DIAGNOSIS — Z87891 Personal history of nicotine dependence: Secondary | ICD-10-CM

## 2018-01-18 DIAGNOSIS — E118 Type 2 diabetes mellitus with unspecified complications: Secondary | ICD-10-CM | POA: Diagnosis present

## 2018-01-18 LAB — TROPONIN I: Troponin I: 0.03 ng/mL (ref <0.03)

## 2018-01-18 LAB — GLUCOSE, CAPILLARY: GLUCOSE-CAPILLARY: 249 mg/dL — AB (ref 65–99)

## 2018-01-18 LAB — COMPREHENSIVE METABOLIC PANEL
ALBUMIN: 3.5 g/dL (ref 3.5–5.0)
ALK PHOS: 110 U/L (ref 38–126)
ALT: 11 U/L — AB (ref 17–63)
AST: 20 U/L (ref 15–41)
Anion gap: 9 (ref 5–15)
BUN: 23 mg/dL — ABNORMAL HIGH (ref 6–20)
CHLORIDE: 104 mmol/L (ref 101–111)
CO2: 24 mmol/L (ref 22–32)
CREATININE: 0.97 mg/dL (ref 0.61–1.24)
Calcium: 8.5 mg/dL — ABNORMAL LOW (ref 8.9–10.3)
GFR calc Af Amer: >60 mL/min (ref >60)
GFR calc non Af Amer: >60 mL/min (ref >60)
GLUCOSE: 362 mg/dL — AB (ref 65–99)
Potassium: 4.8 mmol/L (ref 3.5–5.1)
SODIUM: 137 mmol/L (ref 135–145)
Total Bilirubin: 0.4 mg/dL (ref 0.3–1.2)
Total Protein: 6.5 g/dL (ref 6.5–8.1)

## 2018-01-18 LAB — CBC
HEMATOCRIT: 30.8 % — AB (ref 40.0–52.0)
Hemoglobin: 10.2 g/dL — ABNORMAL LOW (ref 13.0–18.0)
MCH: 31.6 pg (ref 26.0–34.0)
MCHC: 33.1 g/dL (ref 32.0–36.0)
MCV: 95.6 fL (ref 80.0–100.0)
PLATELETS: 195 10*3/uL (ref 150–440)
RBC: 3.22 MIL/uL — ABNORMAL LOW (ref 4.40–5.90)
RDW: 15.9 % — AB (ref 11.5–14.5)
WBC: 5.7 10*3/uL (ref 3.8–10.6)

## 2018-01-18 MED ORDER — GABAPENTIN 300 MG PO CAPS
300.0000 mg | ORAL_CAPSULE | Freq: Three times a day (TID) | ORAL | Status: DC
  Administered 2018-01-19: 300 mg via ORAL
  Filled 2018-01-18: qty 1

## 2018-01-18 MED ORDER — INSULIN ASPART 100 UNIT/ML ~~LOC~~ SOLN
0.0000 [IU] | Freq: Four times a day (QID) | SUBCUTANEOUS | Status: DC
  Administered 2018-01-18: 3 [IU] via SUBCUTANEOUS
  Administered 2018-01-19: 5 [IU] via SUBCUTANEOUS
  Administered 2018-01-19 (×2): 3 [IU] via SUBCUTANEOUS
  Administered 2018-01-20: 7 [IU] via SUBCUTANEOUS
  Administered 2018-01-20: 9 [IU] via SUBCUTANEOUS
  Administered 2018-01-20: 7 [IU] via SUBCUTANEOUS
  Filled 2018-01-18 (×7): qty 1

## 2018-01-18 MED ORDER — TIOTROPIUM BROMIDE MONOHYDRATE 18 MCG IN CAPS
18.0000 ug | ORAL_CAPSULE | Freq: Every day | RESPIRATORY_TRACT | Status: DC | PRN
  Filled 2018-01-18: qty 5

## 2018-01-18 MED ORDER — SODIUM CHLORIDE 0.9 % IV SOLN
INTRAVENOUS | Status: AC
  Administered 2018-01-18: 23:00:00 via INTRAVENOUS

## 2018-01-18 MED ORDER — ONDANSETRON HCL 4 MG PO TABS: 4.0000 mg | ORAL_TABLET | Freq: Four times a day (QID) | ORAL | Status: DC | PRN

## 2018-01-18 MED ORDER — TORSEMIDE 20 MG PO TABS
40.0000 mg | ORAL_TABLET | Freq: Every day | ORAL | Status: DC
  Administered 2018-01-19 – 2018-01-20 (×2): 40 mg via ORAL
  Filled 2018-01-18 (×2): qty 2

## 2018-01-18 MED ORDER — ATORVASTATIN CALCIUM 20 MG PO TABS
40.0000 mg | ORAL_TABLET | Freq: Every day | ORAL | Status: DC
  Administered 2018-01-18 – 2018-01-19 (×2): 40 mg via ORAL
  Filled 2018-01-18 (×2): qty 2

## 2018-01-18 MED ORDER — CITALOPRAM HYDROBROMIDE 20 MG PO TABS
20.0000 mg | ORAL_TABLET | Freq: Every day | ORAL | Status: DC
  Administered 2018-01-19 – 2018-01-20 (×2): 20 mg via ORAL
  Filled 2018-01-18 (×2): qty 1

## 2018-01-18 MED ORDER — ACETAMINOPHEN 650 MG RE SUPP: 650.0000 mg | Freq: Four times a day (QID) | RECTAL | Status: DC | PRN

## 2018-01-18 MED ORDER — ONDANSETRON HCL 4 MG/2ML IJ SOLN: 4.0000 mg | Freq: Four times a day (QID) | INTRAMUSCULAR | Status: DC | PRN

## 2018-01-18 MED ORDER — MOMETASONE FURO-FORMOTEROL FUM 200-5 MCG/ACT IN AERO
2.0000 | INHALATION_SPRAY | Freq: Two times a day (BID) | RESPIRATORY_TRACT | Status: DC
  Administered 2018-01-18 – 2018-01-20 (×4): 2 via RESPIRATORY_TRACT
  Filled 2018-01-18: qty 8.8

## 2018-01-18 MED ORDER — ACETAMINOPHEN 325 MG PO TABS
650.0000 mg | ORAL_TABLET | Freq: Four times a day (QID) | ORAL | Status: DC | PRN
  Administered 2018-01-18 – 2018-01-20 (×4): 650 mg via ORAL
  Filled 2018-01-18 (×4): qty 2

## 2018-01-18 MED ORDER — SODIUM CHLORIDE 0.9 % IV SOLN
80.0000 mg | Freq: Once | INTRAVENOUS | Status: AC
  Administered 2018-01-18: 80 mg via INTRAVENOUS
  Filled 2018-01-18: qty 80

## 2018-01-18 MED ORDER — GABAPENTIN 300 MG PO CAPS
300.0000 mg | ORAL_CAPSULE | Freq: Once | ORAL | Status: AC
  Administered 2018-01-18: 300 mg via ORAL

## 2018-01-18 MED ORDER — SODIUM CHLORIDE 0.9 % IV SOLN
8.0000 mg/h | INTRAVENOUS | Status: DC
  Administered 2018-01-19 – 2018-01-20 (×4): 8 mg/h via INTRAVENOUS
  Filled 2018-01-18 (×5): qty 80

## 2018-01-18 MED ORDER — SODIUM CHLORIDE 0.9 % IV BOLUS
1000.0000 mL | Freq: Once | INTRAVENOUS | Status: AC
  Administered 2018-01-18: 1000 mL via INTRAVENOUS

## 2018-01-18 MED ORDER — ALPRAZOLAM 0.5 MG PO TABS
0.5000 mg | ORAL_TABLET | Freq: Three times a day (TID) | ORAL | Status: DC | PRN
  Administered 2018-01-19 (×2): 0.5 mg via ORAL
  Filled 2018-01-18 (×2): qty 1

## 2018-01-18 MED ORDER — ZOLPIDEM TARTRATE 5 MG PO TABS
5.0000 mg | ORAL_TABLET | Freq: Every day | ORAL | Status: DC
  Administered 2018-01-18 – 2018-01-19 (×2): 5 mg via ORAL
  Filled 2018-01-18 (×2): qty 1

## 2018-01-18 MED ORDER — GABAPENTIN 600 MG PO TABS
ORAL_TABLET | ORAL | Status: AC
  Administered 2018-01-18: 300 mg
  Filled 2018-01-18: qty 1

## 2018-01-18 NOTE — ED Notes (Signed)
Kala RN, aware of bed assigned  

## 2018-01-18 NOTE — ED Notes (Signed)
Date and time results received: 01/18/18 2035 (use smartphrase ".now" to insert current time)  Test: Troponin Critical Value: 0.03  Name of Provider Notified: Dr.Padachowski  Orders Received? Or Actions Taken?:
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
## 2018-01-18 NOTE — ED Triage Notes (Signed)
Pt presents to ED via [facility] with c/o GI bleed. Per EMS pt had dark, tarry stool, with a syncopal episode after last BM. PT is 99% on 7L via [place] which is his baseline per patient. Pt is alert and oriented.
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
## 2018-01-19 DIAGNOSIS — K922 Gastrointestinal hemorrhage, unspecified: Secondary | ICD-10-CM | POA: Diagnosis not present

## 2018-01-19 DIAGNOSIS — K921 Melena: Secondary | ICD-10-CM | POA: Diagnosis not present

## 2018-01-19 LAB — CBC
HCT: 26.3 % — ABNORMAL LOW (ref 40.0–52.0)
Hemoglobin: 8.4 g/dL — ABNORMAL LOW (ref 13.0–18.0)
MCH: 30.6 pg (ref 26.0–34.0)
MCHC: 31.9 g/dL — AB (ref 32.0–36.0)
MCV: 96.2 fL (ref 80.0–100.0)
Platelets: 201 10*3/uL (ref 150–440)
RBC: 2.74 MIL/uL — ABNORMAL LOW (ref 4.40–5.90)
RDW: 16.2 % — AB (ref 11.5–14.5)
WBC: 5.2 10*3/uL (ref 3.8–10.6)

## 2018-01-19 LAB — GLUCOSE, CAPILLARY
GLUCOSE-CAPILLARY: 229 mg/dL — AB (ref 65–99)
GLUCOSE-CAPILLARY: 227 mg/dL — AB (ref 65–99)
GLUCOSE-CAPILLARY: 254 mg/dL — AB (ref 65–99)

## 2018-01-19 LAB — TROPONIN I: Troponin I: <0.03 ng/mL (ref <0.03)

## 2018-01-19 LAB — BASIC METABOLIC PANEL
ANION GAP: 6 (ref 5–15)
BUN: 19 mg/dL (ref 6–20)
CALCIUM: 8.2 mg/dL — AB (ref 8.9–10.3)
CO2: 26 mmol/L (ref 22–32)
CREATININE: 0.99 mg/dL (ref 0.61–1.24)
Chloride: 109 mmol/L (ref 101–111)
Glucose, Bld: 206 mg/dL — ABNORMAL HIGH (ref 65–99)
Potassium: 4.1 mmol/L (ref 3.5–5.1)
Sodium: 141 mmol/L (ref 135–145)

## 2018-01-19 LAB — HEMOGLOBIN
Hemoglobin: 8.3 g/dL — ABNORMAL LOW (ref 13.0–18.0)
Hemoglobin: 8.8 g/dL — ABNORMAL LOW (ref 13.0–18.0)

## 2018-01-19 MED ORDER — INSULIN GLARGINE 100 UNIT/ML ~~LOC~~ SOLN
10.0000 [IU] | Freq: Two times a day (BID) | SUBCUTANEOUS | Status: DC
  Administered 2018-01-19 – 2018-01-20 (×2): 10 [IU] via SUBCUTANEOUS
  Filled 2018-01-19 (×4): qty 0.1

## 2018-01-19 MED ORDER — ACETAMINOPHEN 325 MG PO TABS
650.0000 mg | ORAL_TABLET | Freq: Once | ORAL | Status: AC
  Administered 2018-01-19: 650 mg via ORAL
  Filled 2018-01-19: qty 2

## 2018-01-19 MED ORDER — GABAPENTIN 300 MG PO CAPS
300.0000 mg | ORAL_CAPSULE | Freq: Four times a day (QID) | ORAL | Status: DC
  Administered 2018-01-19 – 2018-01-20 (×5): 300 mg via ORAL
  Filled 2018-01-19 (×5): qty 1
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
## 2018-01-19 NOTE — Progress Notes (Signed)
Inpatient Diabetes Program Recommendations  AACE/ADA: New Consensus Statement on Inpatient Glycemic Control (2015)  Target Ranges:  Prepandial:   less than 140 mg/dL      Peak postprandial:   less than 180 mg/dL (1-2 hours)      Critically ill patients:  140 - 180 mg/dL   Results for DEQUANE, STRAHAN (MRN [id number]) as of 01/19/2018 08:34  Ref. Range 01/18/2018 23:37 01/19/2018 05:47  Glucose-Capillary Latest Ref Range: 65 - 99 mg/dL 249 (H) 229 (H)     Home DM Meds: Lantus 20 units BID        Novolog 10 units TID        Metformin 500 mg BID  Current Orders: Novolog Sensitive Correction Scale/ SSI (0-9 units) Q6 hours (started at [facility])       MD- Please consider the following in-hospital insulin adjustments:  1. Change Novolog SSI to Q4 hour coverage while NPO (currently only ordered Q6 hours)  2. Start Lantus 10 units BID (50% total home dose of basal insulin)     --Will follow patient during hospitalization--  Wyn Quaker RN, MSN, CDE Diabetes Coordinator Inpatient Glycemic Control Team Team Pager: [phone] (8a-5p)
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
## 2018-01-20 DIAGNOSIS — K921 Melena: Secondary | ICD-10-CM | POA: Diagnosis not present

## 2018-01-20 DIAGNOSIS — K922 Gastrointestinal hemorrhage, unspecified: Secondary | ICD-10-CM | POA: Diagnosis not present

## 2018-01-20 LAB — GLUCOSE, CAPILLARY
Glucose-Capillary: 316 mg/dL — ABNORMAL HIGH (ref 65–99)
Glucose-Capillary: 326 mg/dL — ABNORMAL HIGH (ref 65–99)
Glucose-Capillary: 337 mg/dL — ABNORMAL HIGH (ref 65–99)
Glucose-Capillary: 368 mg/dL — ABNORMAL HIGH (ref 65–99)

## 2018-01-20 LAB — HEMOGLOBIN
HEMOGLOBIN: 8.7 g/dL — AB (ref 13.0–18.0)
HEMOGLOBIN: 8.3 g/dL — AB (ref 13.0–18.0)
Hemoglobin: 11.5 g/dL — ABNORMAL LOW (ref 13.0–18.0)

## 2018-01-20 LAB — PREPARE RBC (CROSSMATCH)

## 2018-01-20 MED ORDER — GABAPENTIN 300 MG PO CAPS: 300.0000 mg | ORAL_CAPSULE | Freq: Three times a day (TID) | ORAL | 0 refills | Status: DC

## 2018-01-20 MED ORDER — ALPRAZOLAM 0.25 MG PO TABS: 0.2500 mg | ORAL_TABLET | Freq: Three times a day (TID) | ORAL | 0 refills | Status: DC | PRN

## 2018-01-20 MED ORDER — SODIUM CHLORIDE 0.9 % IV SOLN
Freq: Once | INTRAVENOUS | Status: AC
  Administered 2018-01-20: 13:00:00 via INTRAVENOUS

## 2018-01-20 NOTE — Progress Notes (Signed)
[name] to be D/C'd Home per MD order.  Discussed prescriptions and follow up appointments with the patient. Prescriptions given to patient, medication list explained in detail. Pt verbalized understanding.  Allergies as of 01/20/2018      Reactions   Morphine And Related Shortness Of Breath, Nausea And Vomiting, Rash, Other (See Comments)   Reaction:  Hallucinations    Penicillins Anaphylaxis, Hives, Other (See Comments)   Has patient had a PCN reaction causing immediate rash, facial/tongue/throat swelling, SOB or lightheadedness with hypotension: Yes Has patient had a PCN reaction causing severe rash involving mucus membranes or skin necrosis: No Has patient had a PCN reaction that required hospitalization No Has patient had a PCN reaction occurring within the last 10 years: No If all of the above answers are "NO", then may proceed with Cephalosporin use.   Zolpidem Shortness Of Breath   Demerol [meperidine] Other (See Comments)   Reaction:  Hallucinations     Dilaudid [hydromorphone Hcl] Other (See Comments)   Reaction:  Hallucinations    Levofloxacin Other (See Comments)   Reaction:  Unknown       Medication List    STOP taking these medications   torsemide 20 MG tablet Commonly known as:  DEMADEX   vitamin B-12 1000 MCG tablet Commonly known as:  CYANOCOBALAMIN     TAKE these medications   acetaminophen 325 MG tablet Commonly known as:  TYLENOL Take 650 mg by mouth every 6 (six) hours as needed for mild pain, fever or headache.   albuterol 108 (90 Base) MCG/ACT inhaler Commonly known as:  PROVENTIL HFA;VENTOLIN HFA Inhale 2 puffs into the lungs every 6 (six) hours as needed for wheezing or shortness of breath.   ALPRAZolam 0.25 MG tablet Commonly known as:  XANAX Take 1 tablet (0.25 mg total) by mouth 3 (three) times daily as needed for anxiety or sleep.   atorvastatin 40 MG tablet Commonly known as:  LIPITOR Take 40 mg by mouth at bedtime.    cholecalciferol 1000 units tablet Commonly known as:  VITAMIN D Take 1,000 Units by mouth daily.   citalopram 40 MG tablet Commonly known as:  CELEXA Take 0.5 tablets (20 mg total) by mouth daily. What changed:  how much to take   ferrous sulfate 325 (65 FE) MG tablet Take 1 tablet (325 mg total) by mouth 3 (three) times daily with meals.   folic acid 1 MG tablet Commonly known as:  FOLVITE Take 1 mg by mouth daily.   gabapentin 300 MG capsule Commonly known as:  NEURONTIN Take 1 capsule (300 mg total) by mouth 3 (three) times daily for 15 days.   insulin aspart 100 UNIT/ML injection Commonly known as:  novoLOG Inject 10 Units into the skin daily.   insulin glargine 100 UNIT/ML injection Commonly known as:  LANTUS Inject 0.25 mLs (25 Units total) into the skin 2 (two) times daily. What changed:  how much to take   OXYGEN Inhale 6 L into the lungs continuous.   pantoprazole 40 MG tablet Commonly known as:  PROTONIX Take 1 tablet (40 mg total) by mouth daily.   SYMBICORT 160-4.5 MCG/ACT inhaler Generic drug:  budesonide-formoterol Inhale 2 puffs into the lungs 2 (two) times daily.   tiotropium 18 MCG inhalation capsule Commonly known as:  SPIRIVA Place 18 mcg into inhaler and inhale daily as needed (shortness of breath).       Vitals:   01/20/18 1353 01/20/18 1618  BP: 105/60 (!) 99/59  Pulse: 99 100  Resp: 20 18  Temp: 98.4 F (36.9 C) 98.6 F (37 C)  SpO2: 100% 100%    Skin clean, dry and intact without evidence of skin break down, no evidence of skin tears noted. IV catheter discontinued intact. Site without signs and symptoms of complications. Dressing and pressure applied. Pt denies pain at this time. No complaints noted.  An After Visit Summary was printed and given to the patient. Patient escorted via [place], and D/C home via private auto.  Sharalyn Ink

## 2018-01-20 NOTE — Care Management Important Message (Signed)
Important Message  Patient Details  Name: [name] MRN: [id number] Date of Birth: [birth year]   Medicare Important Message Given:  N/A - LOS <3 / Initial given by admissions    Katrina Stack, RN 01/20/2018, 2:59 PM

## 2018-01-21 LAB — TYPE AND SCREEN
ABO/RH(D): B NEG
ANTIBODY SCREEN: NEGATIVE
Unit division: 0

## 2018-01-21 LAB — BPAM RBC
BLOOD PRODUCT EXPIRATION DATE: 201905012359
ISSUE DATE / TIME: 201904061329
Unit Type and Rh: 1700

## 2018-01-21 NOTE — Discharge Summary (Signed)
[facility] [place] NAME: [name]    MR#:  [id number]  DATE OF BIRTH:  [birth year]  DATE OF ADMISSION:  01/18/2018 ADMITTING PHYSICIAN: Lance Coon, MD  DATE OF DISCHARGE: 01/20/2018  7:05 PM  PRIMARY CARE PHYSICIAN: [name], MD    ADMISSION DIAGNOSIS:  Gastrointestinal hemorrhage, unspecified gastrointestinal hemorrhage type [K92.2]  DISCHARGE DIAGNOSIS:  Principal Problem:   GI bleed Active Problems:   Hyperlipidemia   Essential hypertension   COPD (chronic obstructive pulmonary disease) (HCC)   Chronic diastolic CHF (congestive heart failure) (HCC)   CAD (coronary artery disease)   Bipolar 1 disorder, mixed, moderate (HCC)   Congenital gastrointestinal vessel anomaly   Diabetes mellitus with complication ([place])   SECONDARY DIAGNOSIS:   Past Medical History:  Diagnosis Date  . AAA (abdominal aortic aneurysm) ([place])    a. 12/2008 s/p 7cm, endovascular repair with coiling right hypogastric artery   . Allergic rhinitis, cause unspecified   . AVM (arteriovenous malformation) of colon with hemorrhage   . Bipolar 1 disorder, mixed, moderate ([place]) 04/16/2015  . CAD (coronary artery disease)    a. 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  . Chronic diastolic CHF (congestive heart failure) ([place])    a. 04/2015 Echo: EF 55-60%, no rwma, Gr 1 DD, mild AI.  Marland Kitchen Complication of anesthesia    "if they sedate me for too long, they have to intubate me; then they can't get me to come out of it" (04/03/2017)  . COPD (chronic obstructive pulmonary disease) ([place])    a. GOLD stage IV, started home O2. Severe bullous disease of LUL. Prolonged intubation after surgeries due to COPD.  Marland Kitchen Depression with anxiety 01/14/2013  . Diverticulosis   . Duodenal ulcer   . Emphysema of lung ([name])   . Essential hypertension 08/18/2009   Qualifier: Diagnosis of  By: Doy Mince LPN, Megan    . GERD (gastroesophageal reflux disease)    . GI bleed requiring more than 4 units of blood in 24 hours, ICU, or surgery    a. Hx bleeding gastric polyps, cecal & sigmoid AVMS s/p APC 03/30/14  . History of blood transfusion    "many many many; related to blood loss; anemia"  . Hyperlipidemia   . Insomnia 08/10/2014  . Leucocytosis 12/04/2013  . Memory loss   . Morbid obesity ([place])   . Multiple gastric polyps   . Myocardial infarction ([place])    "I think I had a minor one when I had the OHS"  . On home oxygen therapy    "7 liters  w/oxigenator" (04/03/2017)  . Pneumonia 2017  . Recurrent Microcytic Anemia    a. presumed chronic GI blood loss.  . Type II diabetes mellitus ([place])   . Vitamin D deficiency 08/10/2014    HOSPITAL COURSE:   77 year old male with past medical history of recurrent GI bleed secondary to AV malformations, anemia of chronic disease, morbid obesity, chronic respiratory failure secondary to COPD/CHF, anxiety, bipolar disorder, chronic diastolic CHF who presented to the hospital due to weakness and shortness of breath and noted to have anemia.  1.  Symptomatic anemia-this was the cause of patient's weakness and shortness of breath.  Patient has a history of previous recurrent GI bleeds and therefore was observed overnight and had no further acute bleeding. -Patient was transfused 1 unit of packed red blood cells and hemoglobin has improved to 11 prior to discharge. -The case was discussed  with patient's gastroenterologist Dr. Allen Norris who agreed with this management and close follow-up with him as an outpatient next week.  2.  History of recurrent GI bleeds-patient presented with a low hemoglobin and also dark stools.  Patient stools although became normal brown in color shortly after admission.  Patient's hemoglobin was closer to 8 and he was given 1 unit of packed red blood cells given his acute symptoms.  Patient was transfused and hemoglobin is improved and patient is now being discharged home with follow-up with  his gastroenterologist as outpatient.  3.  Chronic respiratory failure secondary to COPD-patient will continue his oxygen supplementation, he will continue his inhalers as stated below.  4.  Anxiety-patient will continue his Xanax.  Next  5.  Neuropathy-patient will continue his gabapentin.  6.  Diabetes type 2 without complication-patient will continue his Lantus, NovoLog with meals.  DISCHARGE CONDITIONS:   Stable  CONSULTS OBTAINED:    DRUG ALLERGIES:   Allergies  Allergen Reactions  . Morphine And Related Shortness Of Breath, Nausea And Vomiting, Rash and Other (See Comments)    Reaction:  Hallucinations   . Penicillins Anaphylaxis, Hives and Other (See Comments)    Has patient had a PCN reaction causing immediate rash, facial/tongue/throat swelling, SOB or lightheadedness with hypotension: Yes Has patient had a PCN reaction causing severe rash involving mucus membranes or skin necrosis: No Has patient had a PCN reaction that required hospitalization No Has patient had a PCN reaction occurring within the last 10 years: No If all of the above answers are "NO", then may proceed with Cephalosporin use.  Marland Kitchen Zolpidem Shortness Of Breath  . Demerol [Meperidine] Other (See Comments)    Reaction:  Hallucinations    . Dilaudid [Hydromorphone Hcl] Other (See Comments)    Reaction:  Hallucinations   . Levofloxacin Other (See Comments)    Reaction:  Unknown     DISCHARGE MEDICATIONS:   Allergies as of 01/20/2018      Reactions   Morphine And Related Shortness Of Breath, Nausea And Vomiting, Rash, Other (See Comments)   Reaction:  Hallucinations    Penicillins Anaphylaxis, Hives, Other (See Comments)   Has patient had a PCN reaction causing immediate rash, facial/tongue/throat swelling, SOB or lightheadedness with hypotension: Yes Has patient had a PCN reaction causing severe rash involving mucus membranes or skin necrosis: No Has patient had a PCN reaction that required  hospitalization No Has patient had a PCN reaction occurring within the last 10 years: No If all of the above answers are "NO", then may proceed with Cephalosporin use.   Zolpidem Shortness Of Breath   Demerol [meperidine] Other (See Comments)   Reaction:  Hallucinations     Dilaudid [hydromorphone Hcl] Other (See Comments)   Reaction:  Hallucinations    Levofloxacin Other (See Comments)   Reaction:  Unknown       Medication List    STOP taking these medications   torsemide 20 MG tablet Commonly known as:  DEMADEX   vitamin B-12 1000 MCG tablet Commonly known as:  CYANOCOBALAMIN     TAKE these medications   acetaminophen 325 MG tablet Commonly known as:  TYLENOL Take 650 mg by mouth every 6 (six) hours as needed for mild pain, fever or headache.   albuterol 108 (90 Base) MCG/ACT inhaler Commonly known as:  PROVENTIL HFA;VENTOLIN HFA Inhale 2 puffs into the lungs every 6 (six) hours as needed for wheezing or shortness of breath.   ALPRAZolam 0.25 MG tablet Commonly  known as:  XANAX Take 1 tablet (0.25 mg total) by mouth 3 (three) times daily as needed for anxiety or sleep.   atorvastatin 40 MG tablet Commonly known as:  LIPITOR Take 40 mg by mouth at bedtime.   cholecalciferol 1000 units tablet Commonly known as:  VITAMIN D Take 1,000 Units by mouth daily.   citalopram 40 MG tablet Commonly known as:  CELEXA Take 0.5 tablets (20 mg total) by mouth daily. What changed:  how much to take   ferrous sulfate 325 (65 FE) MG tablet Take 1 tablet (325 mg total) by mouth 3 (three) times daily with meals.   folic acid 1 MG tablet Commonly known as:  FOLVITE Take 1 mg by mouth daily.   gabapentin 300 MG capsule Commonly known as:  NEURONTIN Take 1 capsule (300 mg total) by mouth 3 (three) times daily for 15 days.   insulin aspart 100 UNIT/ML injection Commonly known as:  novoLOG Inject 10 Units into the skin daily.   insulin glargine 100 UNIT/ML injection Commonly  known as:  LANTUS Inject 0.25 mLs (25 Units total) into the skin 2 (two) times daily. What changed:  how much to take   OXYGEN Inhale 6 L into the lungs continuous.   pantoprazole 40 MG tablet Commonly known as:  PROTONIX Take 1 tablet (40 mg total) by mouth daily.   SYMBICORT 160-4.5 MCG/ACT inhaler Generic drug:  budesonide-formoterol Inhale 2 puffs into the lungs 2 (two) times daily.   tiotropium 18 MCG inhalation capsule Commonly known as:  SPIRIVA Place 18 mcg into inhaler and inhale daily as needed (shortness of breath).         DISCHARGE INSTRUCTIONS:   DIET:  Cardiac diet and Diabetic diet  DISCHARGE CONDITION:  Stable  ACTIVITY:  Activity as tolerated  OXYGEN:  Home Oxygen: Yes.     Oxygen Delivery: 2 liters/min via Patient connected to nasal cannula oxygen  DISCHARGE LOCATION:  home   If you experience worsening of your admission symptoms, develop shortness of breath, life threatening emergency, suicidal or homicidal thoughts you must seek medical attention immediately by calling 911 or calling your MD immediately  if symptoms less severe.  You Must read complete instructions/literature along with all the possible adverse reactions/side effects for all the Medicines you take and that have been prescribed to you. Take any new Medicines after you have completely understood and accpet all the possible adverse reactions/side effects.   Please note  You were cared for by a hospitalist during your hospital stay. If you have any questions about your discharge medications or the care you received while you were in the hospital after you are discharged, you can call the unit and asked to speak with the hospitalist on call if the hospitalist that took care of you is not available. Once you are discharged, your primary care physician will handle any further medical issues. Please note that NO REFILLS for any discharge medications will be authorized once you are  discharged, as it is imperative that you return to your primary care physician (or establish a relationship with a primary care physician if you do not have one) for your aftercare needs so that they can reassess your need for medications and monitor your lab values.     Today   Patient denies any abdominal pain, stools are more formed and brown in color today.  Discussed with his gastroenterologist and will transfuse 1 unit and follow-up with him as an outpatient.  VITAL  SIGNS:  Blood pressure (!) 99/59, pulse 100, temperature 98.6 F (37 C), temperature source Oral, resp. rate 18, height 6\' 3"  (1.905 m), weight 116.3 kg (256 lb 6.4 oz), SpO2 100 %.  I/O:    Intake/Output Summary (Last 24 hours) at 01/21/2018 1550 Last data filed at 01/20/2018 1700 Gross per 24 hour  Intake 600 ml  Output -  Net 600 ml    PHYSICAL EXAMINATION:  GENERAL:  77 y.o.-year-old obese patient lying in the bed in no acute distress.  EYES: Pupils equal, round, reactive to light and accommodation. No scleral icterus. Extraocular muscles intact.  HEENT: Head atraumatic, normocephalic. Oropharynx and nasopharynx clear.  NECK:  Supple, no jugular venous distention. No thyroid enlargement, no tenderness.  LUNGS: Normal breath sounds bilaterally, no wheezing, rales,rhonchi. No use of accessory muscles of respiration.  CARDIOVASCULAR: S1, S2 normal. No murmurs, rubs, or gallops.  ABDOMEN: Soft, non-tender, non-distended. Bowel sounds present. No organomegaly or mass.  EXTREMITIES: No pedal edema, cyanosis, or clubbing.  NEUROLOGIC: Cranial nerves II through XII are intact. No focal motor or sensory defecits b/l.  PSYCHIATRIC: The patient is alert and oriented x 3.  SKIN: No obvious rash, lesion, or ulcer.   DATA REVIEW:   CBC Recent Labs  Lab 01/19/18 0914  01/20/18 1727  WBC 5.2  --   --   HGB 8.4*   < > 11.5*  HCT 26.3*  --   --   PLT 201  --   --    < > = values in this interval not displayed.     Chemistries  Recent Labs  Lab 01/18/18 1915 01/19/18 0502  NA 137 141  K 4.8 4.1  CL 104 109  CO2 24 26  GLUCOSE 362* 206*  BUN 23* 19  CREATININE 0.97 0.99  CALCIUM 8.5* 8.2*  AST 20  --   ALT 11*  --   ALKPHOS 110  --   BILITOT 0.4  --     Cardiac Enzymes Recent Labs  Lab 01/19/18 0951  TROPONINI <0.03      RADIOLOGY:  No results found.    Management plans discussed with the patient, family and they are in agreement.  CODE STATUS:  Code Status History    Date Active Date Inactive Code Status Order ID Comments User Context   01/18/2018 2225 01/20/2018 2210 Full Code [id number]  Lance Coon, MD Inpatient    TOTAL TIME TAKING CARE OF THIS PATIENT: 40 minutes.    Henreitta Leber M.D on 01/21/2018 at 3:50 PM  Between 7am to 6pm - Pager - [phone]  After 6pm go to [email or web address] - Proofreader  [facility]  Office  [phone]  CC: Primary care physician; [name], MD

## 2018-01-24 ENCOUNTER — Telehealth: Payer: Self-pay
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
## 2018-01-25 ENCOUNTER — Telehealth: Payer: Self-pay | Admitting: Pediatrics

## 2018-01-25 ENCOUNTER — Ambulatory Visit: Payer: Medicare HMO | Admitting: Gastroenterology

## 2018-01-25 NOTE — Telephone Encounter (Signed)
Patient answered No to the question are wounds healing well.  RNCM called patient to follow up.  Patient states that he has a scab area from where they tried to draw blood in the hospital.  Patient states "I have been applying ointment, and aloe to the site, but it doesn't look any better".  Patient denies any pain or redness.  Patient states that he has not followed up with his PCP "Because I haven't felt up to it".  RNCM offered to schedule a follow up appointment and patient declined.

## 2018-01-26 ENCOUNTER — Inpatient Hospital Stay: Payer: Medicare HMO | Admitting: Adult Health

## 2018-01-28 ENCOUNTER — Encounter: Payer: Self-pay | Admitting: Internal Medicine

## 2018-01-28 ENCOUNTER — Inpatient Hospital Stay
Admission: EM | Admit: 2018-01-28 | Discharge: 2018-01-30 | DRG: 811 | Disposition: A | Discharge status: Home / Self Care | Payer: Medicare Other | Attending: Internal Medicine | Admitting: Internal Medicine

## 2018-01-28 ENCOUNTER — Emergency Department: Payer: Medicare Other

## 2018-01-28 ENCOUNTER — Other Ambulatory Visit: Payer: Self-pay

## 2018-01-28 DIAGNOSIS — Q2730 Arteriovenous malformation, site unspecified: Secondary | ICD-10-CM | POA: Diagnosis not present

## 2018-01-28 DIAGNOSIS — I2510 Atherosclerotic heart disease of native coronary artery without angina pectoris: Secondary | ICD-10-CM | POA: Diagnosis not present

## 2018-01-28 DIAGNOSIS — I252 Old myocardial infarction: Secondary | ICD-10-CM | POA: Diagnosis not present

## 2018-01-28 DIAGNOSIS — Z951 Presence of aortocoronary bypass graft: Secondary | ICD-10-CM

## 2018-01-28 DIAGNOSIS — E871 Hypo-osmolality and hyponatremia: Secondary | ICD-10-CM | POA: Diagnosis present

## 2018-01-28 DIAGNOSIS — E669 Obesity, unspecified: Secondary | ICD-10-CM | POA: Diagnosis present

## 2018-01-28 DIAGNOSIS — Z825 Family history of asthma and other chronic lower respiratory diseases: Secondary | ICD-10-CM

## 2018-01-28 DIAGNOSIS — I5032 Chronic diastolic (congestive) heart failure: Secondary | ICD-10-CM | POA: Diagnosis not present

## 2018-01-28 DIAGNOSIS — E1165 Type 2 diabetes mellitus with hyperglycemia: Secondary | ICD-10-CM | POA: Diagnosis not present

## 2018-01-28 DIAGNOSIS — D62 Acute posthemorrhagic anemia: Principal | ICD-10-CM | POA: Diagnosis present

## 2018-01-28 DIAGNOSIS — I110 Hypertensive heart disease with heart failure: Secondary | ICD-10-CM | POA: Diagnosis not present

## 2018-01-28 DIAGNOSIS — Z794 Long term (current) use of insulin: Secondary | ICD-10-CM

## 2018-01-28 DIAGNOSIS — J9611 Chronic respiratory failure with hypoxia: Secondary | ICD-10-CM | POA: Diagnosis present

## 2018-01-28 DIAGNOSIS — E785 Hyperlipidemia, unspecified: Secondary | ICD-10-CM | POA: Diagnosis not present

## 2018-01-28 DIAGNOSIS — Z9981 Dependence on supplemental oxygen: Secondary | ICD-10-CM | POA: Diagnosis not present

## 2018-01-28 DIAGNOSIS — Z881 Allergy status to other antibiotic agents status: Secondary | ICD-10-CM

## 2018-01-28 DIAGNOSIS — Z885 Allergy status to narcotic agent status: Secondary | ICD-10-CM

## 2018-01-28 DIAGNOSIS — F418 Other specified anxiety disorders: Secondary | ICD-10-CM | POA: Diagnosis present

## 2018-01-28 DIAGNOSIS — Z87891 Personal history of nicotine dependence: Secondary | ICD-10-CM

## 2018-01-28 DIAGNOSIS — K5521 Angiodysplasia of colon with hemorrhage: Secondary | ICD-10-CM | POA: Diagnosis present

## 2018-01-28 DIAGNOSIS — J439 Emphysema, unspecified: Secondary | ICD-10-CM | POA: Diagnosis present

## 2018-01-28 DIAGNOSIS — D649 Anemia, unspecified: Secondary | ICD-10-CM

## 2018-01-28 DIAGNOSIS — Z8679 Personal history of other diseases of the circulatory system: Secondary | ICD-10-CM | POA: Diagnosis not present

## 2018-01-28 DIAGNOSIS — Z8249 Family history of ischemic heart disease and other diseases of the circulatory system: Secondary | ICD-10-CM

## 2018-01-28 DIAGNOSIS — E559 Vitamin D deficiency, unspecified: Secondary | ICD-10-CM | POA: Diagnosis present

## 2018-01-28 DIAGNOSIS — K5520 Angiodysplasia of colon without hemorrhage: Secondary | ICD-10-CM | POA: Diagnosis not present

## 2018-01-28 DIAGNOSIS — Z6832 Body mass index (BMI) 32.0-32.9, adult: Secondary | ICD-10-CM | POA: Diagnosis not present

## 2018-01-28 DIAGNOSIS — E1140 Type 2 diabetes mellitus with diabetic neuropathy, unspecified: Secondary | ICD-10-CM | POA: Diagnosis present

## 2018-01-28 DIAGNOSIS — K210 Gastro-esophageal reflux disease with esophagitis: Secondary | ICD-10-CM | POA: Diagnosis present

## 2018-01-28 DIAGNOSIS — E869 Volume depletion, unspecified: Secondary | ICD-10-CM | POA: Diagnosis present

## 2018-01-28 DIAGNOSIS — D500 Iron deficiency anemia secondary to blood loss (chronic): Secondary | ICD-10-CM | POA: Diagnosis present

## 2018-01-28 DIAGNOSIS — F3162 Bipolar disorder, current episode mixed, moderate: Secondary | ICD-10-CM | POA: Diagnosis not present

## 2018-01-28 DIAGNOSIS — Z880 Allergy status to penicillin: Secondary | ICD-10-CM

## 2018-01-28 DIAGNOSIS — Z833 Family history of diabetes mellitus: Secondary | ICD-10-CM

## 2018-01-28 DIAGNOSIS — Z888 Allergy status to other drugs, medicaments and biological substances status: Secondary | ICD-10-CM

## 2018-01-28 LAB — HEPATIC FUNCTION PANEL
ALK PHOS: 103 U/L (ref 38–126)
ALT: 12 U/L — AB (ref 17–63)
AST: 17 U/L (ref 15–41)
Albumin: 3.1 g/dL — ABNORMAL LOW (ref 3.5–5.0)
BILIRUBIN TOTAL: 0.4 mg/dL (ref 0.3–1.2)
Total Protein: 6 g/dL — ABNORMAL LOW (ref 6.5–8.1)

## 2018-01-28 LAB — CBC
HCT: 21.5 % — ABNORMAL LOW (ref 40.0–52.0)
Hemoglobin: 7.4 g/dL — ABNORMAL LOW (ref 13.0–18.0)
MCH: 32.4 pg (ref 26.0–34.0)
MCHC: 34.4 g/dL (ref 32.0–36.0)
MCV: 94.1 fL (ref 80.0–100.0)
PLATELETS: 207 10*3/uL (ref 150–440)
RBC: 2.28 MIL/uL — ABNORMAL LOW (ref 4.40–5.90)
RDW: 16.3 % — ABNORMAL HIGH (ref 11.5–14.5)
WBC: 7.1 10*3/uL (ref 3.8–10.6)

## 2018-01-28 LAB — GLUCOSE, CAPILLARY
GLUCOSE-CAPILLARY: 272 mg/dL — AB (ref 65–99)
GLUCOSE-CAPILLARY: 257 mg/dL — AB (ref 65–99)
Glucose-Capillary: 343 mg/dL — ABNORMAL HIGH (ref 65–99)
Glucose-Capillary: 393 mg/dL — ABNORMAL HIGH (ref 65–99)
Glucose-Capillary: 327 mg/dL — ABNORMAL HIGH (ref 65–99)

## 2018-01-28 LAB — RETICULOCYTES
RBC.: 2.64 MIL/uL — ABNORMAL LOW (ref 4.40–5.90)
Retic Count, Absolute: 261.4 10*3/uL — ABNORMAL HIGH (ref 19.0–183.0)
Retic Ct Pct: 9.9 % — ABNORMAL HIGH (ref 0.4–3.1)

## 2018-01-28 LAB — LACTATE DEHYDROGENASE: LDH: 151 U/L (ref 98–192)

## 2018-01-28 LAB — BASIC METABOLIC PANEL
Anion gap: 5 (ref 5–15)
BUN: 27 mg/dL — AB (ref 6–20)
CALCIUM: 8.7 mg/dL — AB (ref 8.9–10.3)
CHLORIDE: 95 mmol/L — AB (ref 101–111)
CO2: 33 mmol/L — ABNORMAL HIGH (ref 22–32)
CREATININE: 1.17 mg/dL (ref 0.61–1.24)
GFR calc non Af Amer: 59 mL/min — ABNORMAL LOW (ref >60)
Glucose, Bld: 421 mg/dL — ABNORMAL HIGH (ref 65–99)
Potassium: 4.9 mmol/L (ref 3.5–5.1)
SODIUM: 133 mmol/L — AB (ref 135–145)

## 2018-01-28 LAB — TROPONIN I

## 2018-01-28 LAB — BRAIN NATRIURETIC PEPTIDE: B NATRIURETIC PEPTIDE 5: 54 pg/mL (ref 0.0–100.0)

## 2018-01-28 LAB — PREPARE RBC (CROSSMATCH)

## 2018-01-28 MED ORDER — PANTOPRAZOLE SODIUM 40 MG IV SOLR
40.0000 mg | Freq: Two times a day (BID) | INTRAVENOUS | Status: DC
  Administered 2018-01-28 – 2018-01-29 (×3): 40 mg via INTRAVENOUS
  Filled 2018-01-28 (×3): qty 40

## 2018-01-28 MED ORDER — SENNOSIDES-DOCUSATE SODIUM 8.6-50 MG PO TABS: 1.0000 | ORAL_TABLET | Freq: Every evening | ORAL | Status: DC | PRN

## 2018-01-28 MED ORDER — SODIUM CHLORIDE 0.9 % IV SOLN: INTRAVENOUS | Status: DC

## 2018-01-28 MED ORDER — TIOTROPIUM BROMIDE MONOHYDRATE 18 MCG IN CAPS
18.0000 ug | ORAL_CAPSULE | Freq: Every day | RESPIRATORY_TRACT | Status: DC | PRN
  Administered 2018-01-28 – 2018-01-30 (×2): 18 ug via RESPIRATORY_TRACT
  Filled 2018-01-28: qty 5

## 2018-01-28 MED ORDER — ALBUTEROL SULFATE (2.5 MG/3ML) 0.083% IN NEBU
2.5000 mg | INHALATION_SOLUTION | Freq: Four times a day (QID) | RESPIRATORY_TRACT | Status: DC | PRN
Start: 2018-01-28 — End: 2018-01-30

## 2018-01-28 MED ORDER — TRAMADOL HCL 50 MG PO TABS
50.0000 mg | ORAL_TABLET | Freq: Four times a day (QID) | ORAL | Status: DC | PRN
  Administered 2018-01-28: 50 mg via ORAL
  Filled 2018-01-28: qty 1

## 2018-01-28 MED ORDER — CITALOPRAM HYDROBROMIDE 20 MG PO TABS
20.0000 mg | ORAL_TABLET | Freq: Every day | ORAL | Status: DC
  Administered 2018-01-28 – 2018-01-30 (×3): 20 mg via ORAL
  Filled 2018-01-28 (×3): qty 1

## 2018-01-28 MED ORDER — ONDANSETRON HCL 4 MG PO TABS: 4.0000 mg | ORAL_TABLET | Freq: Four times a day (QID) | ORAL | Status: DC | PRN

## 2018-01-28 MED ORDER — SODIUM CHLORIDE 0.9 % IV SOLN
400.0000 mg | Freq: Once | INTRAVENOUS | Status: AC
  Administered 2018-01-28: 400 mg via INTRAVENOUS
  Filled 2018-01-28: qty 20

## 2018-01-28 MED ORDER — GABAPENTIN 300 MG PO CAPS
300.0000 mg | ORAL_CAPSULE | Freq: Three times a day (TID) | ORAL | Status: DC
  Administered 2018-01-28 – 2018-01-30 (×8): 300 mg via ORAL
  Filled 2018-01-28 (×8): qty 1

## 2018-01-28 MED ORDER — ACETAMINOPHEN 500 MG PO TABS
1000.0000 mg | ORAL_TABLET | Freq: Once | ORAL | Status: AC
  Administered 2018-01-28: 1000 mg via ORAL
  Filled 2018-01-28: qty 2

## 2018-01-28 MED ORDER — SODIUM CHLORIDE 0.9 % IV SOLN: 10.0000 mL/h | Freq: Once | INTRAVENOUS | Status: DC

## 2018-01-28 MED ORDER — MOMETASONE FURO-FORMOTEROL FUM 200-5 MCG/ACT IN AERO
2.0000 | INHALATION_SPRAY | Freq: Two times a day (BID) | RESPIRATORY_TRACT | Status: DC
  Administered 2018-01-28 – 2018-01-30 (×5): 2 via RESPIRATORY_TRACT
  Filled 2018-01-28: qty 8.8

## 2018-01-28 MED ORDER — INSULIN ASPART 100 UNIT/ML ~~LOC~~ SOLN
10.0000 [IU] | Freq: Every day | SUBCUTANEOUS | Status: DC
  Administered 2018-01-28 – 2018-01-29 (×2): 10 [IU] via SUBCUTANEOUS
  Filled 2018-01-28 (×2): qty 1

## 2018-01-28 MED ORDER — FERROUS SULFATE 325 (65 FE) MG PO TABS
325.0000 mg | ORAL_TABLET | Freq: Three times a day (TID) | ORAL | Status: DC
  Administered 2018-01-28 – 2018-01-30 (×9): 325 mg via ORAL
  Filled 2018-01-28 (×9): qty 1

## 2018-01-28 MED ORDER — ALPRAZOLAM 0.25 MG PO TABS
0.2500 mg | ORAL_TABLET | Freq: Three times a day (TID) | ORAL | Status: DC | PRN
  Administered 2018-01-28 – 2018-01-30 (×4): 0.25 mg via ORAL
  Filled 2018-01-28 (×4): qty 1

## 2018-01-28 MED ORDER — INSULIN ASPART 100 UNIT/ML ~~LOC~~ SOLN
5.0000 [IU] | Freq: Once | SUBCUTANEOUS | Status: AC
Start: 2018-01-28 — End: 2018-01-28
  Administered 2018-01-28: 5 [IU] via SUBCUTANEOUS
  Filled 2018-01-28: qty 1

## 2018-01-28 MED ORDER — ATORVASTATIN CALCIUM 20 MG PO TABS
40.0000 mg | ORAL_TABLET | Freq: Every day | ORAL | Status: DC
  Administered 2018-01-28 – 2018-01-29 (×2): 40 mg via ORAL
  Filled 2018-01-28 (×2): qty 2

## 2018-01-28 MED ORDER — BISACODYL 5 MG PO TBEC: 5.0000 mg | DELAYED_RELEASE_TABLET | Freq: Every day | ORAL | Status: DC | PRN

## 2018-01-28 MED ORDER — INSULIN GLARGINE 100 UNIT/ML ~~LOC~~ SOLN
25.0000 [IU] | Freq: Two times a day (BID) | SUBCUTANEOUS | Status: DC
  Administered 2018-01-28 – 2018-01-29 (×4): 25 [IU] via SUBCUTANEOUS
  Filled 2018-01-28 (×5): qty 0.25

## 2018-01-28 MED ORDER — ONDANSETRON HCL 4 MG/2ML IJ SOLN: 4.0000 mg | Freq: Four times a day (QID) | INTRAMUSCULAR | Status: DC | PRN

## 2018-01-28 MED ORDER — FOLIC ACID 1 MG PO TABS
1.0000 mg | ORAL_TABLET | Freq: Every day | ORAL | Status: DC
  Administered 2018-01-28 – 2018-01-30 (×3): 1 mg via ORAL
  Filled 2018-01-28 (×3): qty 1

## 2018-01-28 MED ORDER — INSULIN ASPART 100 UNIT/ML ~~LOC~~ SOLN
0.0000 [IU] | Freq: Three times a day (TID) | SUBCUTANEOUS | Status: DC
  Administered 2018-01-28: 8 [IU] via SUBCUTANEOUS
  Administered 2018-01-28: 11 [IU] via SUBCUTANEOUS
  Administered 2018-01-28 – 2018-01-29 (×2): 15 [IU] via SUBCUTANEOUS
  Administered 2018-01-29: 09:00:00 8 [IU] via SUBCUTANEOUS
  Administered 2018-01-29 – 2018-01-30 (×3): 15 [IU] via SUBCUTANEOUS
  Administered 2018-01-30: 8 [IU] via SUBCUTANEOUS
  Filled 2018-01-28 (×10): qty 1

## 2018-01-28 MED ORDER — ACETAMINOPHEN 325 MG PO TABS
650.0000 mg | ORAL_TABLET | Freq: Four times a day (QID) | ORAL | Status: DC | PRN
  Administered 2018-01-28 – 2018-01-30 (×4): 650 mg via ORAL
  Filled 2018-01-28 (×4): qty 2

## 2018-01-28 MED ORDER — VITAMIN D 1000 UNITS PO TABS
1000.0000 [IU] | ORAL_TABLET | Freq: Every day | ORAL | Status: DC
  Administered 2018-01-28 – 2018-01-30 (×3): 1000 [IU] via ORAL
  Filled 2018-01-28 (×3): qty 1

## 2018-01-28 MED ORDER — FUROSEMIDE 10 MG/ML IJ SOLN
40.0000 mg | Freq: Once | INTRAMUSCULAR | Status: AC
  Administered 2018-01-28: 16:00:00 40 mg via INTRAVENOUS
  Filled 2018-01-28: qty 4

## 2018-01-28 MED ORDER — FENTANYL CITRATE (PF) 100 MCG/2ML IJ SOLN: 50.0000 ug | Freq: Once | INTRAMUSCULAR | Status: DC

## 2018-01-28 NOTE — Care Management Obs Status (Signed)
[facility] NOTIFICATION   Patient Details  Name: [name] MRN: [id number] Date of Birth: 05-06-1941   Medicare Observation Status Notification Given:  Yes    Shelbie Ammons, RN 01/28/2018, 11:13 AM

## 2018-01-28 NOTE — ED Notes (Signed)
Pt watching tv in no acute distress.  

## 2018-01-28 NOTE — ED Notes (Signed)
Pt apologized to this rn for "yelling at you, I know you were just trying to help me". Pt adjusted in bed multiple times for comfort.
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
## 2018-01-28 NOTE — Plan of Care (Signed)
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
## 2018-01-28 NOTE — Progress Notes (Signed)
Patient ID: [name], male   DOB: 10-29-1940, 77 y.o.   MRN: [id number]   ACP note  Patient present for conversation.  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    CODE STATUS discussed and patient is a full code. Patient with numerous hospitalizations for the same issue.  Gastroenterology consultation.  Transfuse blood.  Give IV Venofer.  Monitor serial hemoglobins.  Change from observation status to inpatient.  Time spent on ACP discussion 25 minutes  Dr. Loletha Grayer.

## 2018-01-28 NOTE — Care Management Note (Signed)
Case Management Note  Patient Details  Name: NAVI ERBER MRN: [id number] Date of Birth: 03-04-1941  Subjective/Objective:    Admitted to [facility] with the diagnosis of anemia under observation status. Lives with wife, [name] [phone]). Sees Dr. Mancel Bale in [place] as primary care physician. Home Health services are provided by [facility]. Chronic home oxygen per [facility]. Rolling walker and cane in the home.                 Action/Plan: Will continue to follow for discharge needs   Expected Discharge Date:                  Expected Discharge Plan:     In-House Referral:     Discharge planning Services     Post Acute Care Choice:    Choice offered to:     DME Arranged:    DME Agency:     HH Arranged:    HH Agency:     Status of Service:     If discussed at [employer] of Stay Meetings, dates discussed:    Additional Comments:  Shelbie Ammons, RN MSN CCM Care Management [phone] 01/28/2018, 11:06 AM

## 2018-01-28 NOTE — ED Triage Notes (Signed)
Pt with shob, pt was seen for same 2 weeks ago. Pt states was discharged last Saturday after admission for same. Pt states "I feel bad all over". Pt complains of right sided pain. md at bedside.

## 2018-01-28 NOTE — ED Notes (Signed)
Butch rn in to attempt iv insertion x2 without success. Pt yelling at rn in room, states "look at him, the best of the best, can't get it, you get an expert in here!". Pt will not allow this rn to speak, continues to yell at this rn when this rn attempting to explain iv insertion process to pt. Pt states "you just don't like me, want to treat me like a pin cushion". Explained again to pt that this rn is just "going to put the turniquet around your arm and take a look, i'm not going to poke you with a needle unless I feel and see something." pt states "now i'm getting mad, you say you're just going to look, but it's all a lie". Lea, rn in to attempt iv insertion.

## 2018-01-28 NOTE — ED Notes (Signed)
Pt readjusted in bed for comfort.  
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
## 2018-01-28 NOTE — ED Notes (Signed)
md in to attempt iv guided ultrasound.

## 2018-01-28 NOTE — ED Notes (Signed)
hospitalist in to see pt.

## 2018-01-29 ENCOUNTER — Other Ambulatory Visit: Payer: Self-pay

## 2018-01-29 DIAGNOSIS — D62 Acute posthemorrhagic anemia: Secondary | ICD-10-CM | POA: Diagnosis not present

## 2018-01-29 DIAGNOSIS — K5520 Angiodysplasia of colon without hemorrhage: Secondary | ICD-10-CM

## 2018-01-29 DIAGNOSIS — D649 Anemia, unspecified: Secondary | ICD-10-CM | POA: Diagnosis not present

## 2018-01-29 LAB — BASIC METABOLIC PANEL
ANION GAP: 7 (ref 5–15)
BUN: 22 mg/dL — ABNORMAL HIGH (ref 6–20)
CHLORIDE: 92 mmol/L — AB (ref 101–111)
CO2: 32 mmol/L (ref 22–32)
Calcium: 8.1 mg/dL — ABNORMAL LOW (ref 8.9–10.3)
Creatinine, Ser: 1.06 mg/dL (ref 0.61–1.24)
GFR calc Af Amer: >60 mL/min (ref >60)
GFR calc non Af Amer: >60 mL/min (ref >60)
GLUCOSE: 327 mg/dL — AB (ref 65–99)
POTASSIUM: 4 mmol/L (ref 3.5–5.1)
Sodium: 131 mmol/L — ABNORMAL LOW (ref 135–145)

## 2018-01-29 LAB — GLUCOSE, CAPILLARY
GLUCOSE-CAPILLARY: 278 mg/dL — AB (ref 65–99)
GLUCOSE-CAPILLARY: 393 mg/dL — AB (ref 65–99)
GLUCOSE-CAPILLARY: 361 mg/dL — AB (ref 65–99)
Glucose-Capillary: 345 mg/dL — ABNORMAL HIGH (ref 65–99)

## 2018-01-29 LAB — HEMOGLOBIN: Hemoglobin: 8.5 g/dL — ABNORMAL LOW (ref 13.0–18.0)

## 2018-01-29 LAB — PREPARE RBC (CROSSMATCH)

## 2018-01-29 LAB — HAPTOGLOBIN: Haptoglobin: 185 mg/dL (ref 34–200)

## 2018-01-29 MED ORDER — TEMAZEPAM 15 MG PO CAPS
15.0000 mg | ORAL_CAPSULE | Freq: Every evening | ORAL | Status: DC | PRN
  Administered 2018-01-29: 15 mg via ORAL
  Filled 2018-01-29: qty 1

## 2018-01-29 MED ORDER — ZOLPIDEM TARTRATE 5 MG PO TABS: 5.0000 mg | ORAL_TABLET | Freq: Every evening | ORAL | Status: DC | PRN

## 2018-01-29 MED ORDER — FUROSEMIDE 10 MG/ML IJ SOLN
40.0000 mg | Freq: Once | INTRAMUSCULAR | Status: AC
  Administered 2018-01-29: 40 mg via INTRAVENOUS
  Filled 2018-01-29: qty 4

## 2018-01-29 MED ORDER — SODIUM CHLORIDE 0.9 % IV SOLN
Freq: Once | INTRAVENOUS | Status: AC
  Administered 2018-01-29: 12:00:00 via INTRAVENOUS

## 2018-01-29 MED ORDER — INSULIN ASPART 100 UNIT/ML ~~LOC~~ SOLN
6.0000 [IU] | Freq: Three times a day (TID) | SUBCUTANEOUS | Status: DC
  Administered 2018-01-29 – 2018-01-30 (×4): 6 [IU] via SUBCUTANEOUS
  Filled 2018-01-29 (×4): qty 1

## 2018-01-29 MED ORDER — SODIUM CHLORIDE 0.9 % IV SOLN
400.0000 mg | Freq: Once | INTRAVENOUS | Status: AC
  Administered 2018-01-30: 400 mg via INTRAVENOUS
  Filled 2018-01-29: qty 20

## 2018-01-29 MED ORDER — PANTOPRAZOLE SODIUM 40 MG PO TBEC
40.0000 mg | DELAYED_RELEASE_TABLET | Freq: Two times a day (BID) | ORAL | Status: DC
  Administered 2018-01-29 – 2018-01-30 (×2): 40 mg via ORAL
  Filled 2018-01-29 (×2): qty 1

## 2018-01-29 NOTE — Progress Notes (Signed)
Patient ID: [name], male   DOB: 05-23-1941, 77 y.o.   MRN: [id number]  [facility] PROGRESS NOTE  [name] NWG:[id number] DOB: June 27, 1941 DOA: 01/28/2018 PCP: [name], MD  HPI/Subjective: Patient upset that no procedures were planned.  Advised him that he can eat this morning.  Hemoglobin did respond to blood transfusion yesterday.  Objective: Vitals:   01/29/18 1411 01/29/18 1435  BP: 130/84 115/78  Pulse: (!) 104 (!) 107  Resp: 20 20  Temp: 98.2 F (36.8 C) 98.4 F (36.9 C)  SpO2: 99% 99%    Filed Weights   01/28/18 0151 01/28/18 0642  Weight: 116.1 kg (256 lb) 117.3 kg (258 lb 9.6 oz)    ROS: Review of Systems  Constitutional: Negative for chills and fever.  Eyes: Negative for blurred vision.  Respiratory: Negative for cough and shortness of breath.   Cardiovascular: Negative for chest pain.  Gastrointestinal: Negative for abdominal pain, constipation, diarrhea, nausea and vomiting.  Genitourinary: Negative for dysuria.  Musculoskeletal: Positive for joint pain.  Neurological: Negative for dizziness and headaches.  Psychiatric/Behavioral: The patient has insomnia.    Exam: Physical Exam  Constitutional: He is oriented to person, place, and time.  HENT:  Nose: No mucosal edema.  Mouth/Throat: No oropharyngeal exudate or posterior oropharyngeal edema.  Eyes: Pupils are equal, round, and reactive to light. Conjunctivae, EOM and lids are normal.  Neck: No JVD present. Carotid bruit is not present. No edema present. No thyroid mass and no thyromegaly present.  Cardiovascular: S1 normal and S2 normal. Exam reveals no gallop.  No murmur heard. Pulses:      Dorsalis pedis pulses are 2+ on the right side, and 2+ on the left side.  Respiratory: No respiratory distress. He has no wheezes. He has no rhonchi. He has no rales.  GI: Soft. Bowel sounds are normal. There is no tenderness.  Musculoskeletal:       Right ankle: He exhibits no swelling.        Left ankle: He exhibits no swelling.  Lymphadenopathy:    He has no cervical adenopathy.  Neurological: He is alert and oriented to person, place, and time. No cranial nerve deficit.  Skin: Skin is warm. No rash noted. Nails show no clubbing.  Psychiatric: He has a normal mood and affect.      Data Reviewed: Basic Metabolic Panel: Recent Labs  Lab 01/28/18 0242 01/29/18 0353  NA 133* 131*  K 4.9 4.0  CL 95* 92*  CO2 33* 32  GLUCOSE 421* 327*  BUN 27* 22*  CREATININE 1.17 1.06  CALCIUM 8.7* 8.1*   Liver Function Tests: Recent Labs  Lab 01/28/18 0719  AST 17  ALT 12*  ALKPHOS 103  BILITOT 0.4  PROT 6.0*  ALBUMIN 3.1*   CBC: Recent Labs  Lab 01/28/18 0242 01/29/18 0353  WBC 7.1  --   HGB 7.4* 8.5*  HCT 21.5*  --   MCV 94.1  --   PLT 207  --    Cardiac Enzymes: Recent Labs  Lab 01/28/18 0242  TROPONINI <0.03   BNP (last 3 results) Recent Labs    04/03/17 1201 06/28/17 0610 01/28/18 0242  BNP 84.2 38.0 54.0    CBG: Recent Labs  Lab 01/28/18 1204 01/28/18 1716 01/28/18 2112 01/29/18 0816 01/29/18 1203  GLUCAP 327* 272* 257* 278* 393*     Studies: Dg Chest 1 View  Result Date: 01/28/2018 CLINICAL DATA:  Acute onset of shortness of breath. EXAM: CHEST  1 VIEW COMPARISON:  Radiograph 06/28/2017.  CT 04/18/2017 FINDINGS: Post median sternotomy. Unchanged heart size and mediastinal contours with large retrocardiac hiatal hernia. Mild hyperinflation appears chronic. Left apical bleb. Mild bibasilar atelectasis without confluent airspace disease. No pulmonary edema or large pleural effusion. No pneumothorax. Remote right rib fracture. IMPRESSION: 1. No acute findings. 2. Large hiatal hernia. Electronically Signed   By: Jeb Levering M.D.   On: 01/28/2018 02:26    Scheduled Meds: . atorvastatin  40 mg Oral QHS  . cholecalciferol  1,000 Units Oral Daily  . citalopram  20 mg Oral Daily  . ferrous sulfate  325 mg Oral TID WC  . folic acid   1 mg Oral Daily  . gabapentin  300 mg Oral TID  . insulin aspart  0-15 Units Subcutaneous TID WC  . insulin aspart  6 Units Subcutaneous TID WC  . insulin glargine  25 Units Subcutaneous BID  . mometasone-formoterol  2 puff Inhalation BID  . pantoprazole  40 mg Oral BID   Continuous Infusions:  Assessment/Plan:  1. Acute on chronic blood loss anemia and chronic GI bleed.  Symptomatic anemia.  With this patient continually losing blood and numerous transfusions I will give another unit of blood today and give another dose of IV iron tomorrow morning and reevaluate. 2. 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 Type 2 diabetes mellitus on glargine insulin and sliding scale 4. Hyperlipidemia unspecified on atorvastatin 5. Neuropathy on gabapentin 6. Depression on Celexa  Code Status:     Code Status Orders  (From admission, onward)        Start     Ordered   01/28/18 0642  Full code  Continuous     01/28/18 0641    Code Status History    Date Active Date Inactive Code Status Order ID Comments User Context   01/18/2018 2225 01/20/2018 2210 Full Code [id number]  Lance Coon, MD Inpatient   06/28/2017 1203 06/30/2017 1554 Full Code [id number]  Elease Hashimoto ED   05/08/2017 1341 05/11/2017 2127 Full Code [id number]  Waldemar Dickens, MD ED   04/03/2017 1715 04/10/2017 1745 Full Code [id number]  Waldemar Dickens, MD ED   03/30/2017 1927 03/31/2017 1928 Full Code [id number]  Bonnielee Haff, MD Inpatient   02/22/2017 1614 02/24/2017 1829 Full Code [id number]  Fritzi Mandes, MD Inpatient   11/07/2016 1020 11/09/2016 1825 Full Code [id number]  Saundra Shelling, MD Inpatient   10/21/2016 0414 10/25/2016 1503 Full Code [id number]  Harvie Bridge, DO Inpatient   10/09/2016 1152 10/12/2016 1649 Full Code [id number]  Lytle Butte, MD ED   10/09/2016 1105 10/09/2016 1152 DNR [id number]  Lavetta Nielsen, Aaron Mose, MD ED   07/24/2016 0806 07/27/2016 1759 DNR [id number]  Harrie Foreman, MD  Inpatient   02/10/2016 1528 02/12/2016 2009 DNR [id number]  Loletha Grayer, MD ED   10/26/2015 0314 11/01/2015 2005 Full Code [id number]  Saundra Shelling, MD Inpatient   08/20/2015 0441 08/22/2015 1649 Full Code [id number]  Hower, Aaron Mose, MD ED   08/20/2015 0254 08/20/2015 0441 Full Code [id number]  Lytle Butte, MD ED   07/27/2015 2040 07/29/2015 1829 Full Code [id number]  Hower, Aaron Mose, MD ED   06/30/2015 1240 07/03/2015 1526 Full Code [id number]  Bettey Costa, MD Inpatient   05/19/2015 1459 05/23/2015 1621 Full Code [id number]  Max Sane, MD Inpatient   05/06/2015 1714 05/13/2015 1706 Full Code [id number]  Epifanio Lesches, MD ED   05/03/2015 1229 05/04/2015 1920  Full Code [id number]  Idelle Crouch, MD Inpatient   04/22/2015 0450 04/27/2015 1935 Full Code [id number]  Harrie Foreman, MD Inpatient   04/17/2015 1916 04/19/2015 1716 Full Code [id number]  Clovis Fredrickson, MD Inpatient   04/10/2015 0912 04/17/2015 1908 Full Code [id number]  Demetrios Loll, MD Inpatient   03/09/2015 1218 03/12/2015 1845 Full Code [id number]  Henreitta Leber, MD Inpatient   08/24/2014 2141 08/25/2014 1720 Full Code [id number]  Ivor Costa, MD ED   04/02/2014 2011 04/04/2014 1710 Full Code [id number]  Velvet Bathe, MD Inpatient   03/18/2014 1544 03/22/2014 1406 Full Code [id number]  Verlee Monte, MD Inpatient   12/04/2013 0613 12/10/2013 1954 Full Code [id number]  Rise Patience, MD Inpatient   10/15/2013 2011 10/18/2013 1840 Full Code [id number]  Fuller Plan, MD Inpatient   09/08/2013 0043 09/10/2013 1530 Full Code [id number]  Toy Baker, MD Inpatient   08/19/2013 1748 08/22/2013 1551 Full Code [id number]  Orson Eva, MD ED   05/15/2013 0126 05/17/2013 1659 Full Code [id number]  Theodis Blaze, MD Inpatient   12/04/2012 1723 12/08/2012 1656 Full Code [id number]  Janece Canterbury, MD Inpatient   05/17/2012 1256 05/21/2012 1809 Full Code [id number]  Campbell Lerner, RN ED   04/06/2012 1935 04/14/2012 1723 Full Code [id number]  Marylou Mccoy, RN  Inpatient   03/27/2012 0444 03/28/2012 1756 Full Code [id number]  Theotis Barrio, RN Inpatient     Disposition Plan: Potentially home tomorrow  Consultants:  Gastroenterology  Time spent: 28 minutes  Brizeyda Holtmeyer [employer]

## 2018-01-29 NOTE — Progress Notes (Signed)
   Vonda Antigua, MD [address] [place], [place], [place], [address] [address], [place], [place], [place], [address] Phone: [phone]  Fax: [phone]   Subjective: No signs of active GI bleeding.  Patient tolerating oral diet without difficulty.  No abdominal pain.  No nausea vomiting.  No melena or hematochezia.   Objective: Exam: Vital signs in last 24 hours: Vitals:   01/28/18 1550 01/28/18 2040 01/29/18 0448 01/29/18 1411  BP: (!) 146/65 112/66 114/65 130/84  Pulse: 98 (!) 108 93 (!) 104  Resp: 16 20 18 20   Temp: 98 F (36.7 C) 98.3 F (36.8 C) (!) 97.5 F (36.4 C) 98.2 F (36.8 C)  TempSrc: Oral Oral Oral Oral  SpO2: 100% 97% 94% 99%  Weight:      Height:       Weight change:   Intake/Output Summary (Last 24 hours) at 01/29/2018 1438 Last data filed at 01/29/2018 1428 Gross per 24 hour  Intake 470 ml  Output 1625 ml  Net -1155 ml    General: No acute distress, AAO x3 Abd: Soft, NT/ND, No HSM Skin: Warm, no rashes Neck: Supple, Trachea midline   Lab Results: Lab Results  Component Value Date   WBC 7.1 01/28/2018   HGB 8.5 (L) 01/29/2018   HCT 21.5 (L) 01/28/2018   MCV 94.1 01/28/2018   PLT 207 01/28/2018   Micro Results: No results found for this or any previous visit (from the past 240 hour(s)). Studies/Results: Dg Chest 1 View  Result Date: 01/28/2018 CLINICAL DATA:  Acute onset of shortness of breath. EXAM: CHEST  1 VIEW COMPARISON:  Radiograph 06/28/2017.  CT 04/18/2017 FINDINGS: Post median sternotomy. Unchanged heart size and mediastinal contours with large retrocardiac hiatal hernia. Mild hyperinflation appears chronic. Left apical bleb. Mild bibasilar atelectasis without confluent airspace disease. No pulmonary edema or large pleural effusion. No pneumothorax. Remote right rib fracture. IMPRESSION: 1. No acute findings. 2. Large hiatal hernia. Electronically Signed   By: Jeb Levering M.D.   On: 01/28/2018 02:26    Medications:  Scheduled Meds: . atorvastatin  40 mg Oral QHS  . cholecalciferol  1,000 Units Oral Daily  . citalopram  20 mg Oral Daily  . ferrous sulfate  325 mg Oral TID WC  . folic acid  1 mg Oral Daily  . gabapentin  300 mg Oral TID  . insulin aspart  0-15 Units Subcutaneous TID WC  . insulin aspart  10 Units Subcutaneous Daily  . insulin glargine  25 Units Subcutaneous BID  . mometasone-formoterol  2 puff Inhalation BID  . pantoprazole (PROTONIX) IV  40 mg Intravenous Q12H   Continuous Infusions: PRN Meds:.acetaminophen, albuterol, ALPRAZolam, bisacodyl, ondansetron **OR** ondansetron (ZOFRAN) IV, senna-docusate, tiotropium, traMADol   Assessment: Active Problems:   Symptomatic anemia    Plan: Patient has chronic anemia, with no signs of active GI bleeding Please see consult note for details of multiple previous procedures, and history of AVMs Patient will benefit from IV iron transfusions as an inpatient and outpatient  he was given 1 dose of IV iron yesterday He should follow-up with Hematology as an outpatient to continue IV iron transfusions He should follow-up with his gastroenterologist and primary care doctor as well   LOS: 1 day   Vonda Antigua, MD 01/29/2018, 2:38 PM

## 2018-01-29 NOTE — Progress Notes (Signed)
Inpatient Diabetes Program Recommendations  AACE/ADA: New Consensus Statement on Inpatient Glycemic Control (2015)  Target Ranges:  Prepandial:   less than 140 mg/dL      Peak postprandial:   less than 180 mg/dL (1-2 hours)      Critically ill patients:  140 - 180 mg/dL   Lab Results  Component Value Date   GLUCAP 278 (H) 01/29/2018   HGBA1C 6.0 (H) 06/28/2017    Review of Glycemic ControlResults for CHALMERS, IDDINGS (MRN [id number]) as of 01/29/2018 09:55  Ref. Range 01/28/2018 10:10 01/28/2018 12:04 01/28/2018 17:16 01/28/2018 21:12 01/29/2018 08:16  Glucose-Capillary Latest Ref Range: 65 - 99 mg/dL 393 (H) 327 (H) 272 (H) 257 (H) 278 (H)   Diabetes history: Type 2 DM Outpatient Diabetes medications: Lantus 20 units bid, Novolog 10 units daily Current orders for Inpatient glycemic control:  Lantus 25 units bid, Novolog 10 units daily, Novolog moderate tid with meals  Inpatient Diabetes Program Recommendations:   Please consider d/c of Novolog 10 units daily.  Also consider adding Novolog 6 units tid with meals (hold if patient eats less than 50%).    Thanks,  Adah Perl, RN, BC-ADM Inpatient Diabetes Coordinator Pager [phone] (8a-5p)

## 2018-01-29 NOTE — Progress Notes (Addendum)
PT Cancellation Note  Patient Details Name: [name] MRN: [id number] DOB: [birth year]   Cancelled Treatment:    Reason Eval/Treat Not Completed: Fatigue/lethargy limiting ability to participate.  Awaiting a transfusion of one unit and will see later per pt request.   [name] 01/29/2018, 10:49 AM   [name], PT MS Acute Rehab Dept. Number: [place] [facility] [place]

## 2018-01-29 NOTE — Plan of Care (Signed)

## 2018-01-30 DIAGNOSIS — D649 Anemia, unspecified: Secondary | ICD-10-CM | POA: Diagnosis not present

## 2018-01-30 DIAGNOSIS — D62 Acute posthemorrhagic anemia: Secondary | ICD-10-CM | POA: Diagnosis not present

## 2018-01-30 LAB — TYPE AND SCREEN
ABO/RH(D): B NEG
Antibody Screen: NEGATIVE
UNIT DIVISION: 0
Unit division: 0

## 2018-01-30 LAB — GLUCOSE, CAPILLARY
GLUCOSE-CAPILLARY: 353 mg/dL — AB (ref 65–99)
GLUCOSE-CAPILLARY: 366 mg/dL — AB (ref 65–99)
Glucose-Capillary: 296 mg/dL — ABNORMAL HIGH (ref 65–99)

## 2018-01-30 LAB — BPAM RBC
BLOOD PRODUCT EXPIRATION DATE: 201905042359
Blood Product Expiration Date: 201905012359
ISSUE DATE / TIME: 201904151415
ISSUE DATE / TIME: 201904141210
UNIT TYPE AND RH: 1700
Unit Type and Rh: 1700

## 2018-01-30 LAB — HEMOGLOBIN: HEMOGLOBIN: 9.5 g/dL — AB (ref 13.0–18.0)

## 2018-01-30 IMAGING — CT CT ABD-PELV W/O CM
2 of 4 series · 15 of 46 positions shown, 17 images · non-contrast
Comparison: CT of the abdomen pelvis with contrast 05/08/2017

CLINICAL DATA: Diffuse abdominal pain extending into the chest for
3 days. Dark colored stools. No abdominal aortic aneurysm.

EXAM:
CT ABDOMEN AND PELVIS WITHOUT CONTRAST
TECHNIQUE: Multidetector CT imaging of the abdomen and pelvis was performed
following the standard protocol without IV contrast.

[Series 3: ap without · axial · non-contrast · 0.86mm/px · z∈[+888,+1343]mm · 12 of 103 slices shown, 14 images]
[im 6/103  soft-tissue]
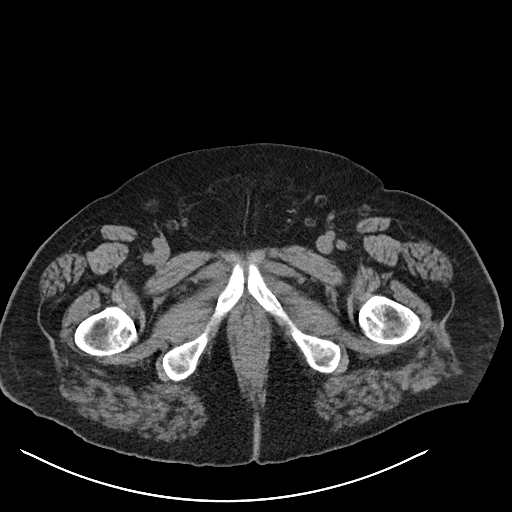
[im 6/103  bone]
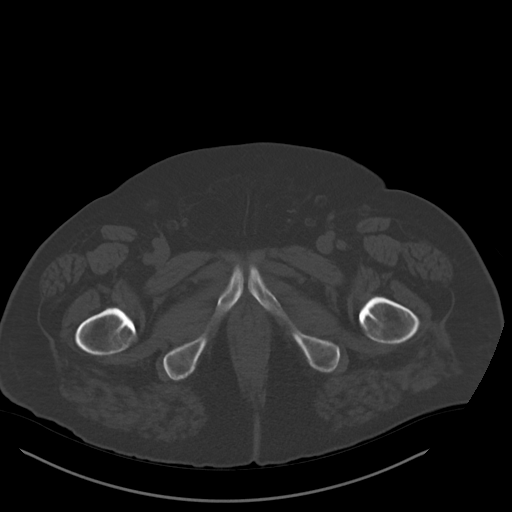
[im 17/103  soft-tissue]
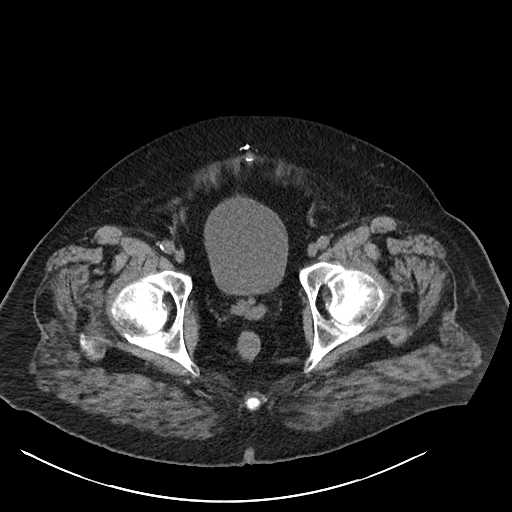
[im 22/103  soft-tissue]
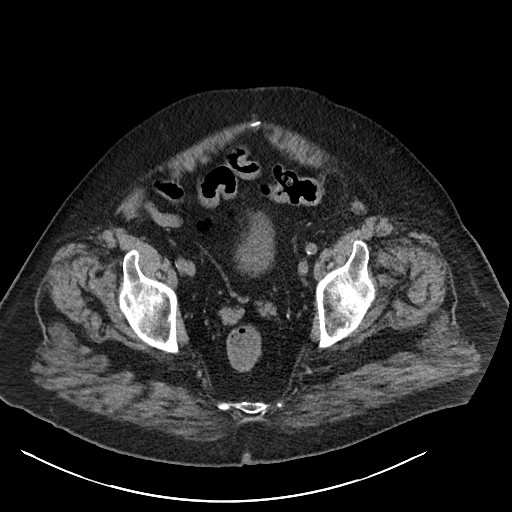
[im 33/103  soft-tissue]
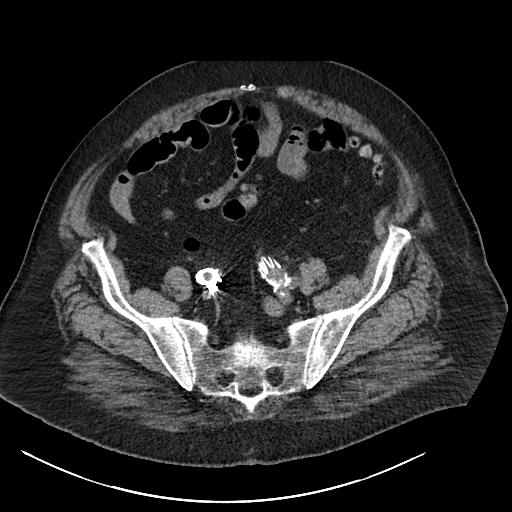
[im 38/103  soft-tissue]
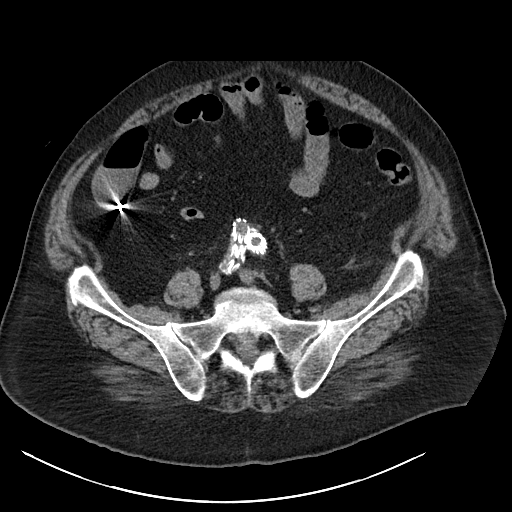
[im 49/103  soft-tissue]
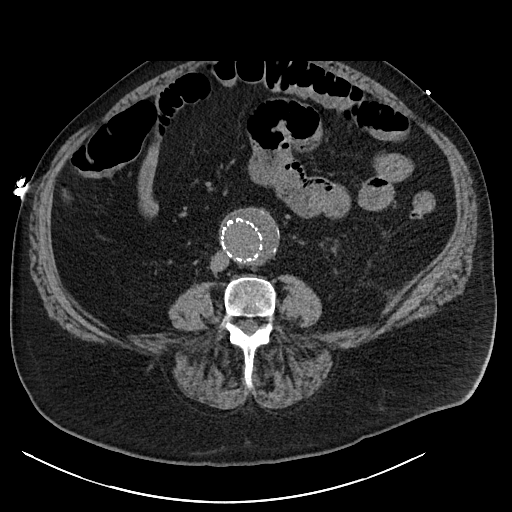
[im 54/103  soft-tissue]
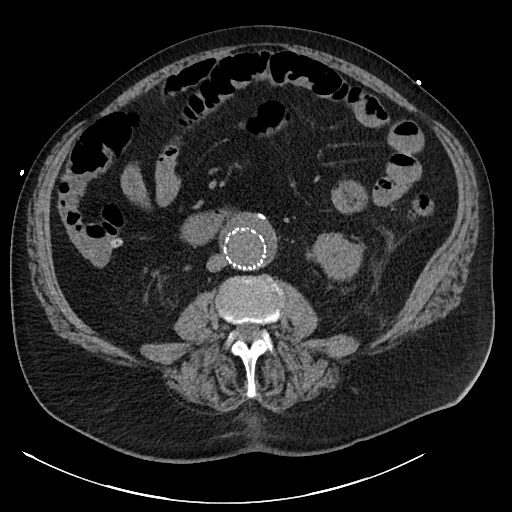
[im 65/103  soft-tissue]
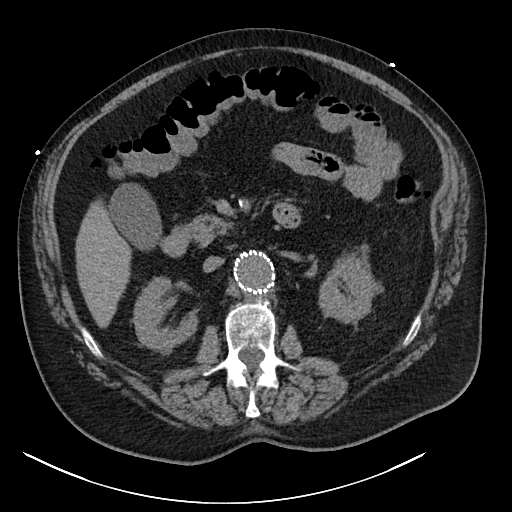
[im 70/103  soft-tissue]
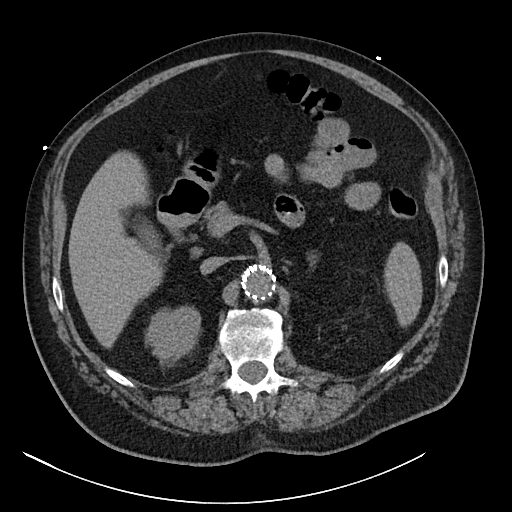
[im 70/103  bone]
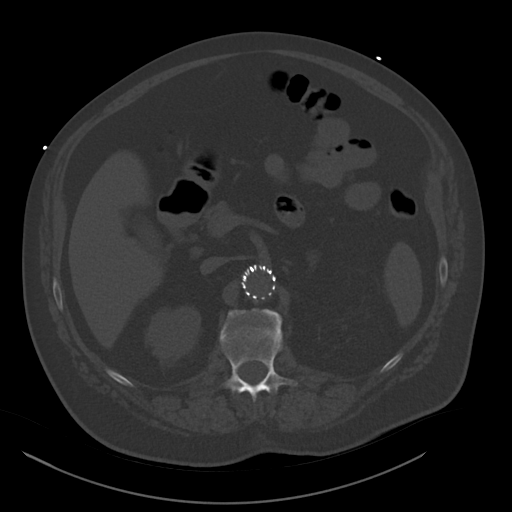
[im 81/103  soft-tissue]
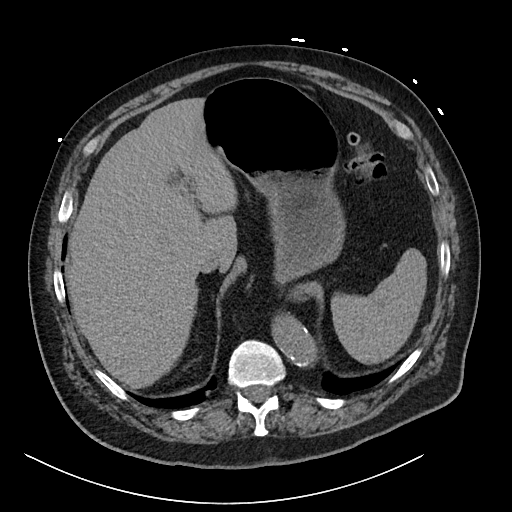
[im 86/103  soft-tissue]
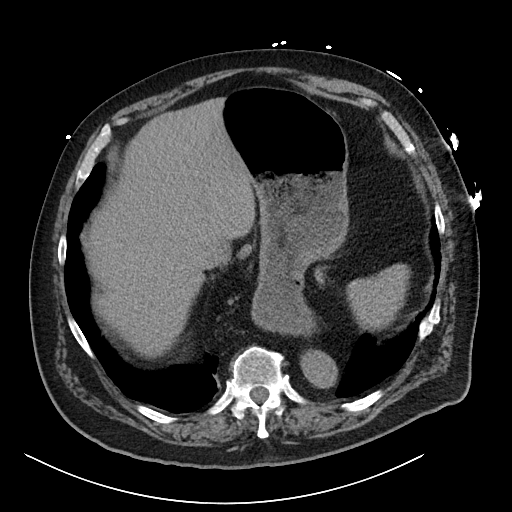
[im 97/103  soft-tissue]
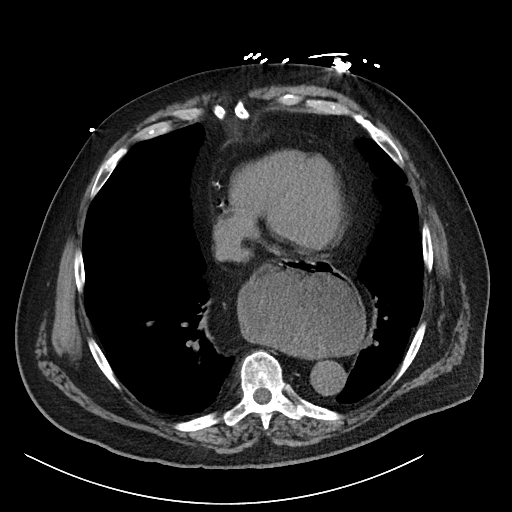

[Series 6: cor · coronal · 0.80mm/px · 3 of 127 slices shown]
[im 43/127  soft-tissue]
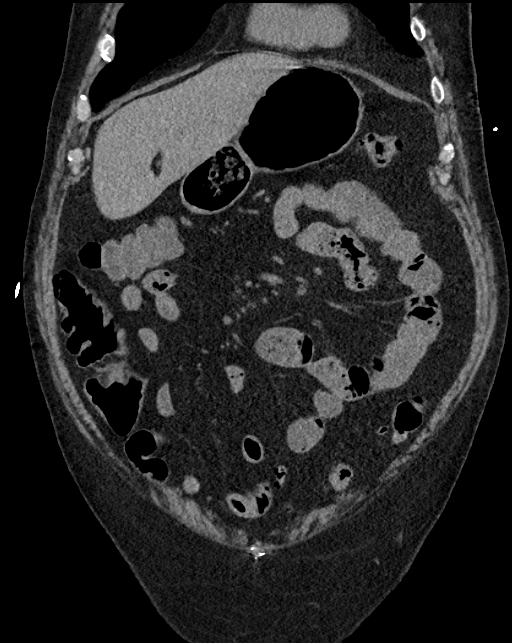
[im 57/127  soft-tissue]
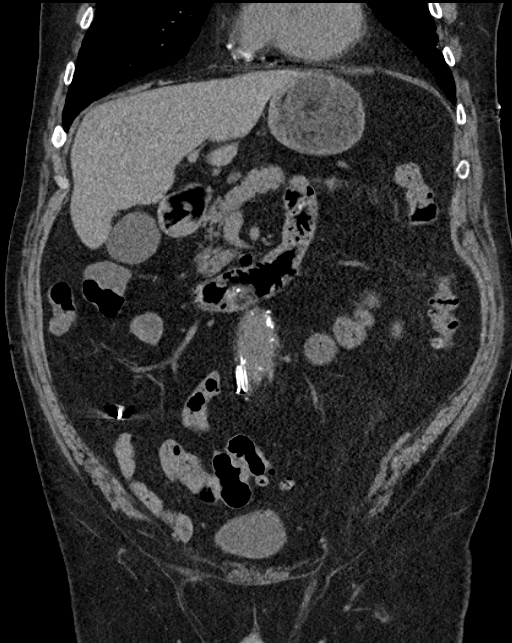
[im 71/127  soft-tissue]
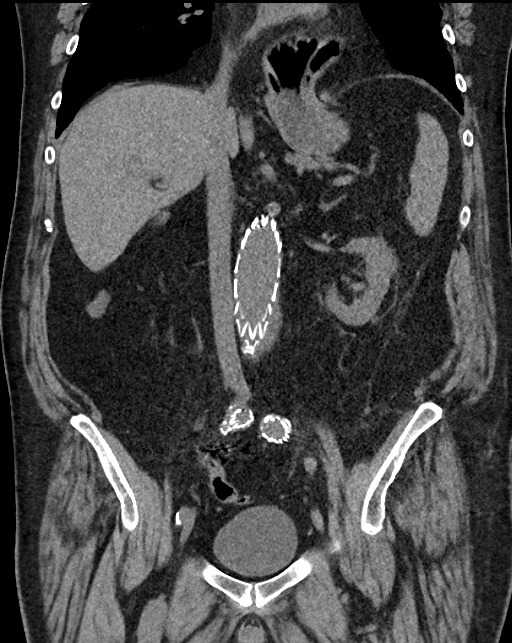

[15 of 46 positions shown; findings below may reference images not displayed]

FINDINGS: Lower chest: Scarring or atelectasis at the lung bases is stable.
Coronary artery calcifications are again noted. No significant
pleural or pericardial effusion is present. A large hiatal hernia is
again noted.

Hepatobiliary: No focal liver abnormality is seen. No gallstones,
gallbladder wall thickening, or biliary dilatation.

Pancreas: Unremarkable. No pancreatic ductal dilatation or
surrounding inflammatory changes.

Spleen: Normal in size without focal abnormality.

Adrenals/Urinary Tract: The adrenal glands are normal bilaterally.
There is some stranding about both kidneys without obstruction or
focal mass lesion. Ureters are within normal limits. There are no
stones. The urinary bladder is within normal limits.

Stomach/Bowel: A large hiatal hernia is present. At least [DATE] of the
stomach is above the diaphragm. This is unchanged. The duodenum is
normal. The small bowel is within normal limits.

The appendix is surgically absent. The ascending and transverse
colon are within normal limits. Diverticular changes are present in
the descending and sigmoid colon without focal inflammation. The
patient is status post laparotomy.

Vascular/Lymphatic: Abdominal aortic stent graft is stable in
position. There is no evidence for leak on this noncontrast study.
No significant adenopathy is present.

Reproductive: Prostate is unremarkable.

Other: Fat herniates into the inguinal canals bilaterally. There is
also fat at the umbilicus. No associated bowel herniation is
present. There is no significant adhesions.

Musculoskeletal: Facet degenerative changes are present in the lower
lumbar spine. Endplate spurring on the left at L5-S1 contributes to
moderate left foraminal stenosis. Vertebral body heights are
maintained. No focal lytic or blastic lesions are present. Bony
pelvis is within normal limits. The hips are unremarkable.
IMPRESSION: 1. No acute or focal lesion to explain the patient's symptoms.
2. Sigmoid diverticulosis without diverticulitis.
3. Stable appearance of aortic stent graft. Aortic aneurysm NOS
(32S7D-9T0.2). The aneurysm is stable.
4. Degenerative changes of the lower lumbar spine.
5.  Aortic Atherosclerosis (32S7D-ONZ.Z).
6. Stable large hiatal hernia.

## 2018-01-30 IMAGING — DX DG CHEST 1V
1 series · 1 of 1 positions shown · non-contrast
Comparison: 05/08/2017

CLINICAL DATA: CHF

EXAM:
CHEST 1 VIEW

[chest ap]
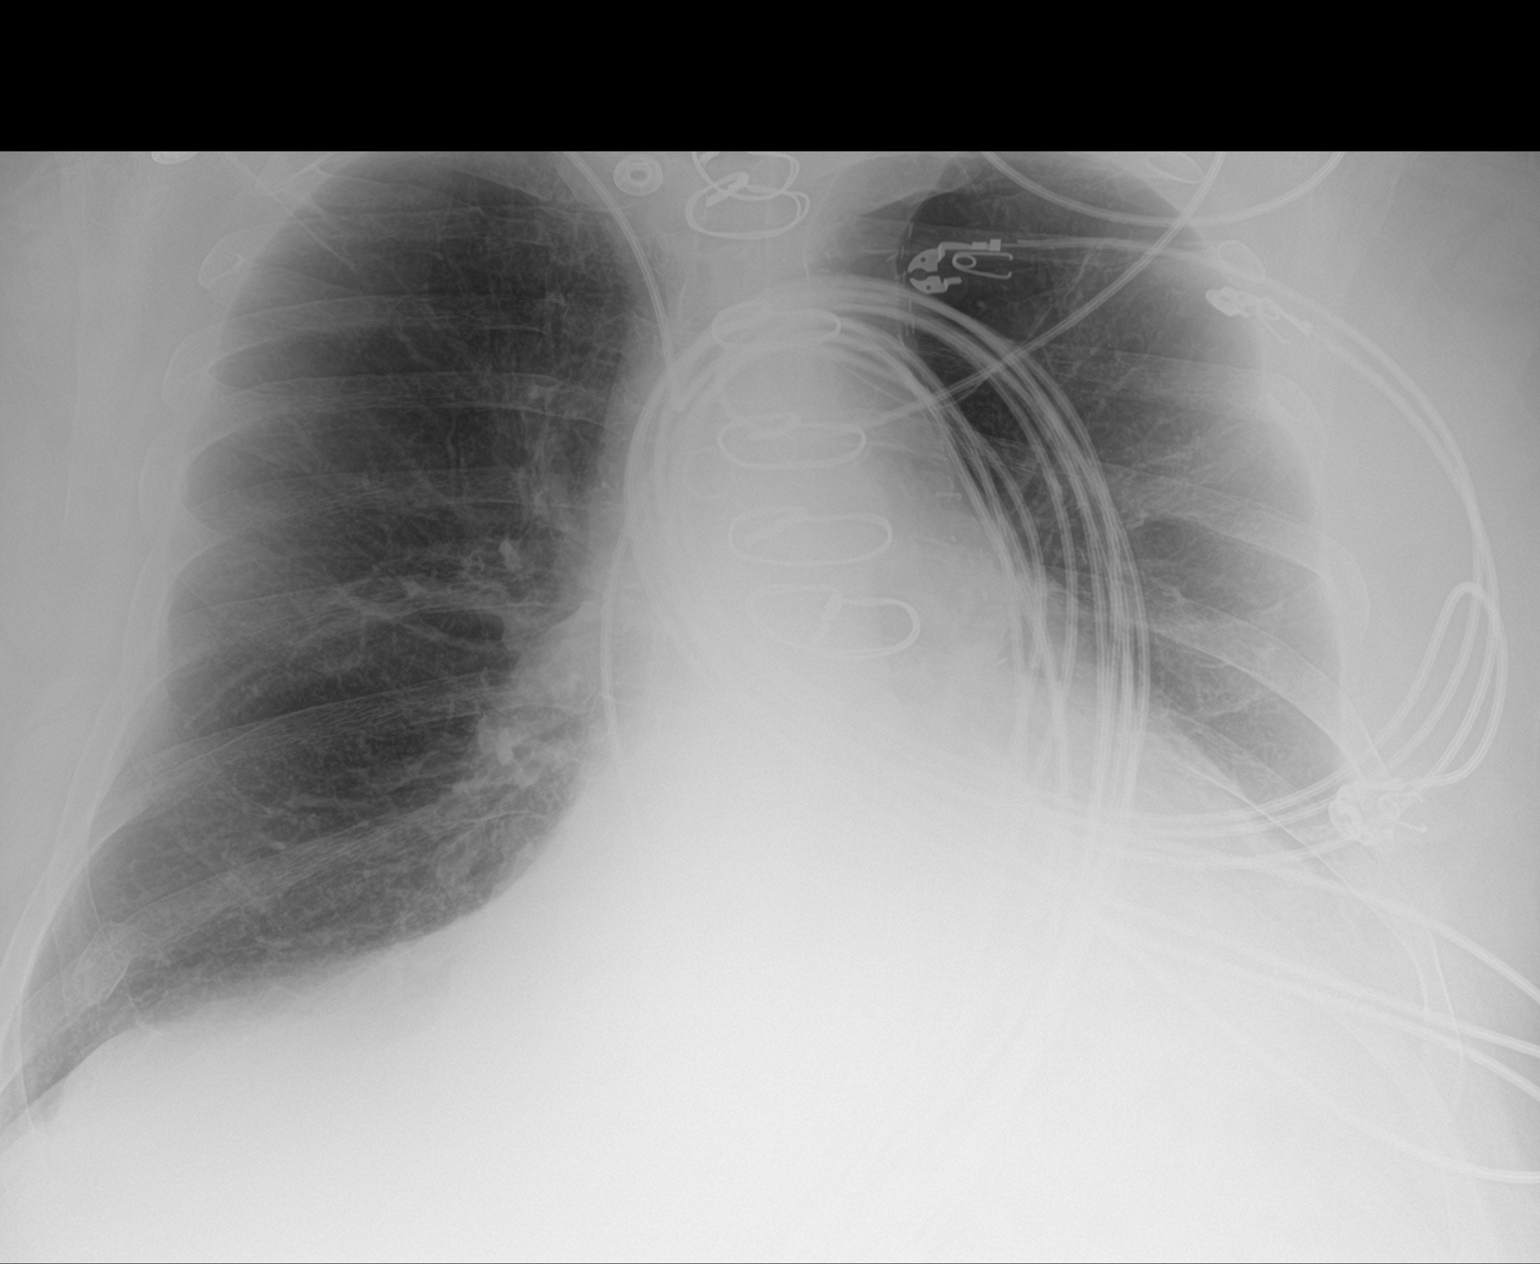

[1 of 1 positions shown; findings below may reference images not displayed]

FINDINGS: Lower mediastinal widening from a large hiatal hernia based on
previous chest CT. Non obscured heart size is likely normal. Status
post CABG. No evidence of edema, effusion, or pneumothorax.
Borderline hyperinflation. Patient has history of COPD.

Limited portable study, with EKG leads crossing the chest.

Remote right rib fracture.
IMPRESSION: 1. Limited portable study without acute finding.
2. Large hiatal hernia.

## 2018-01-30 MED ORDER — INSULIN GLARGINE 100 UNIT/ML ~~LOC~~ SOLN: 25.0000 [IU] | Freq: Two times a day (BID) | SUBCUTANEOUS | 1 refills | Status: DC

## 2018-01-30 MED ORDER — INSULIN ASPART 100 UNIT/ML ~~LOC~~ SOLN: 6.0000 [IU] | Freq: Three times a day (TID) | SUBCUTANEOUS | 11 refills | Status: DC

## 2018-01-30 MED ORDER — FERROUS SULFATE 325 (65 FE) MG PO TABS: 325.0000 mg | ORAL_TABLET | Freq: Three times a day (TID) | ORAL | 0 refills | Status: DC

## 2018-01-30 MED ORDER — INSULIN GLARGINE 100 UNIT/ML ~~LOC~~ SOLN
28.0000 [IU] | Freq: Two times a day (BID) | SUBCUTANEOUS | Status: DC
  Administered 2018-01-30: 28 [IU] via SUBCUTANEOUS
  Filled 2018-01-30 (×3): qty 0.28

## 2018-01-30 NOTE — Progress Notes (Signed)
PT Cancellation Note  Patient Details Name: [name] MRN: [id number] DOB: [birth year]   Cancelled Treatment:    Reason Eval/Treat Not Completed: Patient declined, no reason specified. Chart reviewed, Evaluation attempted. Pt on phone upon entry eating breakfast. PT introduces self to patient. Pt responds with a loud, immediate "forget about it!" PT asks some point for clarification, not having ever met patient before, confused by agitation. Pt continues to respond similarly, eventually saying "I'm eating breakfast, I'm on the phone with my wife. Get out of here." Patient appears and sounds somewhat agitated, very short with verbal responses, does not demonstrate body language consistent with a person who welcomes interaction at this time. Unclear if patient has any interest in working with patient prior to DC. DC orders noted. Will attempt again at later date/time as available.   9:17 AM, 01/30/18 Etta Grandchild, PT, DPT Physical Therapist - [facility]  [phone] ([place])      [name],[name] 01/30/2018, 9:12 AM

## 2018-01-30 NOTE — Progress Notes (Signed)
Patient ID: [name], male   DOB: 1940/10/28, 77 y.o.   MRN: [id number]  Patient very upset about his health. Patient feels weak but refused to work with physicial therapy  He refused me examining him today.  Upset that he did not get a procedure and that I am not giving him more blood on a hemoglobin of 9.5.  Needs follow up appointments with GI, Hematology and PCP.  Will discharge today.

## 2018-01-30 NOTE — Plan of Care (Signed)
  Problem: Education: Goal: Knowledge of General Education information will improve 01/30/2018 1534 by Rowe Robert, RN Outcome: Progressing 01/30/2018 1012 by Rowe Robert, RN Outcome: Progressing   Problem: Health Behavior/Discharge Planning: Goal: Ability to manage health-related needs will improve 01/30/2018 1534 by Rowe Robert, RN Outcome: Progressing 01/30/2018 1012 by Rowe Robert, RN Outcome: Progressing   Problem: Clinical Measurements: Goal: Ability to maintain clinical measurements within normal limits will improve 01/30/2018 1534 by Rowe Robert, RN Outcome: Progressing 01/30/2018 1012 by Rowe Robert, RN Outcome: Progressing Goal: Will remain free from infection 01/30/2018 1534 by Rowe Robert, RN Outcome: Progressing 01/30/2018 1012 by Rowe Robert, RN Outcome: Progressing Goal: Diagnostic test results will improve 01/30/2018 1534 by Rowe Robert, RN Outcome: Progressing 01/30/2018 1012 by Rowe Robert, RN Outcome: Progressing Goal: Respiratory complications will improve 01/30/2018 1534 by Rowe Robert, RN Outcome: Progressing 01/30/2018 1012 by Rowe Robert, RN Outcome: Progressing Goal: Cardiovascular complication will be avoided 01/30/2018 1534 by Rowe Robert, RN Outcome: Progressing 01/30/2018 1012 by Rowe Robert, RN Outcome: Progressing   Problem: Activity: Goal: Risk for activity intolerance will decrease 01/30/2018 1534 by Rowe Robert, RN Outcome: Progressing 01/30/2018 1012 by Rowe Robert, RN Outcome: Progressing   Problem: Nutrition: Goal: Adequate nutrition will be maintained 01/30/2018 1534 by Rowe Robert, RN Outcome: Progressing 01/30/2018 1012 by Rowe Robert, RN Outcome: Progressing   Problem: Coping: Goal: Level of anxiety will decrease 01/30/2018 1534 by Rowe Robert, RN Outcome: Progressing 01/30/2018 1012 by Rowe Robert, RN Outcome: Progressing   Problem: Elimination: Goal: Will not experience  complications related to bowel motility 01/30/2018 1534 by Rowe Robert, RN Outcome: Progressing 01/30/2018 1012 by Rowe Robert, RN Outcome: Progressing Goal: Will not experience complications related to urinary retention 01/30/2018 1534 by Rowe Robert, RN Outcome: Progressing 01/30/2018 1012 by Rowe Robert, RN Outcome: Progressing   Problem: Pain Managment: Goal: General experience of comfort will improve 01/30/2018 1534 by Rowe Robert, RN Outcome: Progressing 01/30/2018 1012 by Rowe Robert, RN Outcome: Progressing   Problem: Safety: Goal: Ability to remain free from injury will improve 01/30/2018 1534 by Rowe Robert, RN Outcome: Progressing 01/30/2018 1012 by Rowe Robert, RN Outcome: Progressing   Problem: Skin Integrity: Goal: Risk for impaired skin integrity will decrease 01/30/2018 1534 by Rowe Robert, RN Outcome: Progressing 01/30/2018 1012 by Rowe Robert, RN Outcome: Progressing

## 2018-01-30 NOTE — Plan of Care (Signed)

## 2018-01-30 NOTE — Discharge Summary (Signed)
[facility] NAME: [name]    MR#:  [id number]  DATE OF BIRTH:  [birth year]  DATE OF ADMISSION:  01/28/2018 ADMITTING PHYSICIAN: Amelia Jo, MD  DATE OF DISCHARGE: 01/30/2018  PRIMARY CARE PHYSICIAN: [name], MD    ADMISSION DIAGNOSIS:  Symptomatic anemia [D64.9]  DISCHARGE DIAGNOSIS:  Active Problems:   Symptomatic anemia   SECONDARY DIAGNOSIS:   Past Medical History:  Diagnosis Date  . AAA (abdominal aortic aneurysm) ([place])    a. 12/2008 s/p 7cm, endovascular repair with coiling right hypogastric artery   . Allergic rhinitis, cause unspecified   . AVM (arteriovenous malformation) of colon with hemorrhage   . Bipolar 1 disorder, mixed, moderate ([place]) 04/16/2015  . CAD (coronary artery disease)    a. 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  . Chronic diastolic CHF (congestive heart failure) ([place])    a. 04/2015 Echo: EF 55-60%, no rwma, Gr 1 DD, mild AI.  Marland Kitchen Complication of anesthesia    "if they sedate me for too long, they have to intubate me; then they can't get me to come out of it" (04/03/2017)  . COPD (chronic obstructive pulmonary disease) ([place])    a. GOLD stage IV, started home O2. Severe bullous disease of LUL. Prolonged intubation after surgeries due to COPD.  Marland Kitchen Depression with anxiety 01/14/2013  . Diverticulosis   . Duodenal ulcer   . Emphysema of lung ([place])   . Essential hypertension 08/18/2009   Qualifier: Diagnosis of  By: Doy Mince LPN, Megan    . GERD (gastroesophageal reflux disease)   . GI bleed requiring more than 4 units of blood in 24 hours, ICU, or surgery    a. Hx bleeding gastric polyps, cecal & sigmoid AVMS s/p APC 03/30/14  . History of blood transfusion    "many many many; related to blood loss; anemia"  . Hyperlipidemia   . Insomnia 08/10/2014  . Leucocytosis 12/04/2013  . Memory loss   . Morbid obesity ([place])   . Multiple gastric polyps   . Myocardial  infarction ([place])    "I think I had a minor one when I had the OHS"  . On home oxygen therapy    "7 liters [place] w/oxigenator" (04/03/2017)  . Pneumonia 2017  . Recurrent Microcytic Anemia    a. presumed chronic GI blood loss.  . Type II diabetes mellitus ([place])   . Vitamin D deficiency 08/10/2014    HOSPITAL COURSE:   1.  Acute on chronic blood loss anemia and chronic GI bleed secondary to AVMs.  Came in with symptomatic anemia.  I did give the patient 2 units of packed red blood cells and 2 iron infusions during the hospital course.  Hemoglobin upon disposition 9.5.  The patient was very upset that I did not give him more blood on a hemoglobin of 9.5.  I explained that is not indicated.  I advised that he needs to follow-up with the hematologist as outpatient where they can give him iron infusions as outpatient and can even transfuse blood in their clinic.  He would have to be an established patient in order to do so.  He then got very upset and refused for me to examine him.  The patient was upset that the gastroenterologist did not do any procedures on him there is hospital course.  Refer to gastroenterology and hematology as outpatient. 2.  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  Continue nebulizer treatments and  inhalers 3.  Type 2 diabetes mellitus on long-acting insulin and short acting insulin prior to meals. 4.  Hyperlipidemia unspecified on atorvastatin 5.  Neuropathy on gabapentin 6.  Depression on Celexa 7.  Weakness the patient refused to walk with physical therapy on 2 occasions in the hospital. 8.  Depression and bipolar disorder on Celexa  The patient's nurse for today Margot Ables was present during my entire interview today.  Case also discussed with care manager  DISCHARGE CONDITIONS:   Fair  CONSULTS OBTAINED:  Treatment Team:  Virgel Manifold, MD  DRUG ALLERGIES:   Allergies  Allergen Reactions  . Morphine And Related Shortness Of Breath, Nausea And Vomiting, Rash and Other  (See Comments)    Reaction:  Hallucinations   . Penicillins Anaphylaxis, Hives and Other (See Comments)    Has patient had a PCN reaction causing immediate rash, facial/tongue/throat swelling, SOB or lightheadedness with hypotension: Yes Has patient had a PCN reaction causing severe rash involving mucus membranes or skin necrosis: No Has patient had a PCN reaction that required hospitalization No Has patient had a PCN reaction occurring within the last 10 years: No If all of the above answers are "NO", then may proceed with Cephalosporin use.  Marland Kitchen Zolpidem Shortness Of Breath  . Demerol [Meperidine] Other (See Comments)    Reaction:  Hallucinations    . Dilaudid [Hydromorphone Hcl] Other (See Comments)    Reaction:  Hallucinations   . Levofloxacin Other (See Comments)    Reaction:  Unknown     DISCHARGE MEDICATIONS:   Allergies as of 01/30/2018      Reactions   Morphine And Related Shortness Of Breath, Nausea And Vomiting, Rash, Other (See Comments)   Reaction:  Hallucinations    Penicillins Anaphylaxis, Hives, Other (See Comments)   Has patient had a PCN reaction causing immediate rash, facial/tongue/throat swelling, SOB or lightheadedness with hypotension: Yes Has patient had a PCN reaction causing severe rash involving mucus membranes or skin necrosis: No Has patient had a PCN reaction that required hospitalization No Has patient had a PCN reaction occurring within the last 10 years: No If all of the above answers are "NO", then may proceed with Cephalosporin use.   Zolpidem Shortness Of Breath   Demerol [meperidine] Other (See Comments)   Reaction:  Hallucinations     Dilaudid [hydromorphone Hcl] Other (See Comments)   Reaction:  Hallucinations    Levofloxacin Other (See Comments)   Reaction:  Unknown       Medication List    TAKE these medications   acetaminophen 325 MG tablet Commonly known as:  TYLENOL Take 650 mg by mouth every 6 (six) hours as needed for mild pain,  fever or headache.   albuterol 108 (90 Base) MCG/ACT inhaler Commonly known as:  PROVENTIL HFA;VENTOLIN HFA Inhale 2 puffs into the lungs every 6 (six) hours as needed for wheezing or shortness of breath.   ALPRAZolam 0.25 MG tablet Commonly known as:  XANAX Take 1 tablet (0.25 mg total) by mouth 3 (three) times daily as needed for anxiety or sleep.   atorvastatin 40 MG tablet Commonly known as:  LIPITOR Take 40 mg by mouth at bedtime.   cholecalciferol 1000 units tablet Commonly known as:  VITAMIN D Take 1,000 Units by mouth daily.   citalopram 40 MG tablet Commonly known as:  CELEXA Take 0.5 tablets (20 mg total) by mouth daily. What changed:    when to take this  additional instructions   ferrous sulfate  325 (65 FE) MG tablet Take 1 tablet (325 mg total) by mouth 3 (three) times daily with meals.   folic acid 1 MG tablet Commonly known as:  FOLVITE Take 1 mg by mouth daily.   gabapentin 300 MG capsule Commonly known as:  NEURONTIN Take 1 capsule (300 mg total) by mouth 3 (three) times daily for 15 days.   insulin aspart 100 UNIT/ML injection Commonly known as:  novoLOG Inject 6 Units into the skin 3 (three) times daily with meals. What changed:    how much to take  when to take this   insulin glargine 100 UNIT/ML injection Commonly known as:  LANTUS Inject 0.25 mLs (25 Units total) into the skin 2 (two) times daily. What changed:  how much to take   OXYGEN Inhale 6 L into the lungs continuous.   pantoprazole 40 MG tablet Commonly known as:  PROTONIX Take 1 tablet (40 mg total) by mouth daily.   SYMBICORT 160-4.5 MCG/ACT inhaler Generic drug:  budesonide-formoterol Inhale 2 puffs into the lungs 2 (two) times daily.   tiotropium 18 MCG inhalation capsule Commonly known as:  SPIRIVA Place 18 mcg into inhaler and inhale daily as needed (shortness of breath).        DISCHARGE INSTRUCTIONS:   Follow-up PMD 6 days Follow-up hematology 1  week Follow-up gastroenterology 2 weeks  If you experience worsening of your admission symptoms, develop shortness of breath, life threatening emergency, suicidal or homicidal thoughts you must seek medical attention immediately by calling 911 or calling your MD immediately  if symptoms less severe.  You Must read complete instructions/literature along with all the possible adverse reactions/side effects for all the Medicines you take and that have been prescribed to you. Take any new Medicines after you have completely understood and accept all the possible adverse reactions/side effects.   Please note  You were cared for by a hospitalist during your hospital stay. If you have any questions about your discharge medications or the care you received while you were in the hospital after you are discharged, you can call the unit and asked to speak with the hospitalist on call if the hospitalist that took care of you is not available. Once you are discharged, your primary care physician will handle any further medical issues. Please note that NO REFILLS for any discharge medications will be authorized once you are discharged, as it is imperative that you return to your primary care physician (or establish a relationship with a primary care physician if you do not have one) for your aftercare needs so that they can reassess your need for medications and monitor your lab values.    Today   CHIEF COMPLAINT:   Chief Complaint  Patient presents with  . Shortness of Breath    HISTORY OF PRESENT ILLNESS:  [name]  is a 77 y.o. male  presented with shortness of breath and found to have a low hemoglobin   VITAL SIGNS:  Blood pressure 117/73, pulse (!) 101, temperature 98.2 F (36.8 C), temperature source Oral, resp. rate 17, height 6\' 3"  (1.905 m), weight 117.3 kg (258 lb 9.6 oz), SpO2 98 %.    PHYSICAL EXAMINATION:   Refused exam today  DATA REVIEW:   CBC Recent Labs  Lab  01/28/18 0242  01/30/18 0547  WBC 7.1  --   --   HGB 7.4*   < > 9.5*  HCT 21.5*  --   --   PLT 207  --   --    < > =  values in this interval not displayed.    Chemistries  Recent Labs  Lab 01/28/18 0719 01/29/18 0353  NA  --  131*  K  --  4.0  CL  --  92*  CO2  --  32  GLUCOSE  --  327*  BUN  --  22*  CREATININE  --  1.06  CALCIUM  --  8.1*  AST 17  --   ALT 12*  --   ALKPHOS 103  --   BILITOT 0.4  --     Cardiac Enzymes Recent Labs  Lab 01/28/18 0242  TROPONINI <0.03    Management plans discussed with the patient.  Patient was not happy that I did not transfuse him more blood on a hemoglobin of 9.5.  CODE STATUS:     Code Status Orders  (From admission, onward)        Start     Ordered   01/28/18 0642  Full code  Continuous     01/28/18 0641    Code Status History    Date Active Date Inactive Code Status Order ID Comments User Context   01/18/2018 2225 01/20/2018 2210 Full Code [id number]  Lance Coon, MD Inpatient   06/28/2017 1203 06/30/2017 1554 Full Code [id number]  Elease Hashimoto ED   05/08/2017 1341 05/11/2017 2127 Full Code [id number]  Waldemar Dickens, MD ED   04/03/2017 1715 04/10/2017 1745 Full Code [id number]  Waldemar Dickens, MD ED   03/30/2017 1927 03/31/2017 1928 Full Code [id number]  Bonnielee Haff, MD Inpatient   02/22/2017 1614 02/24/2017 1829 Full Code [id number]  Fritzi Mandes, MD Inpatient   11/07/2016 1020 11/09/2016 1825 Full Code [id number]  Saundra Shelling, MD Inpatient   10/21/2016 0414 10/25/2016 1503 Full Code [id number]  Harvie Bridge, DO Inpatient   10/09/2016 1152 10/12/2016 1649 Full Code [id number]  Lytle Butte, MD ED   10/09/2016 1105 10/09/2016 1152 DNR [id number]  Lavetta Nielsen, Aaron Mose, MD ED   07/24/2016 0806 07/27/2016 1759 DNR [id number]  Harrie Foreman, MD Inpatient   02/10/2016 1528 02/12/2016 2009 DNR [id number]  Loletha Grayer, MD ED   10/26/2015 0314 11/01/2015 2005 Full Code [id number]  Saundra Shelling, MD Inpatient   08/20/2015 0441  08/22/2015 1649 Full Code [id number]  Hower, Aaron Mose, MD ED   08/20/2015 0254 08/20/2015 0441 Full Code [id number]  Lavetta Nielsen, Aaron Mose, MD ED   07/27/2015 2040 07/29/2015 1829 Full Code [id number]  Hower, Aaron Mose, MD ED   06/30/2015 1240 07/03/2015 1526 Full Code [id number]  Bettey Costa, MD Inpatient   05/19/2015 1459 05/23/2015 1621 Full Code [id number]  Max Sane, MD Inpatient   05/06/2015 1714 05/13/2015 1706 Full Code [id number]  Epifanio Lesches, MD ED   05/03/2015 1229 05/04/2015 1920 Full Code [id number]  Idelle Crouch, MD Inpatient   04/22/2015 0450 04/27/2015 1935 Full Code [id number]  Harrie Foreman, MD Inpatient   04/17/2015 1916 04/19/2015 1716 Full Code [id number]  Clovis Fredrickson, MD Inpatient   04/10/2015 0912 04/17/2015 1908 Full Code [id number]  Demetrios Loll, MD Inpatient   03/09/2015 1218 03/12/2015 1845 Full Code [id number]  Henreitta Leber, MD Inpatient   08/24/2014 2141 08/25/2014 1720 Full Code [id number]  Ivor Costa, MD ED   04/02/2014 2011 04/04/2014 1710 Full Code [id number]  Velvet Bathe, MD Inpatient   03/18/2014 1544 03/22/2014 1406 Full Code [id number]  Verlee Monte, MD Inpatient   12/04/2013 0613 12/10/2013 1954 Full Code [id number]  Rise Patience, MD Inpatient  10/15/2013 2011 10/18/2013 1840 Full Code [id number]  Fuller Plan, MD Inpatient   09/08/2013 0043 09/10/2013 1530 Full Code [id number]  Toy Baker, MD Inpatient   08/19/2013 1748 08/22/2013 1551 Full Code [id number]  Orson Eva, MD ED   05/15/2013 0126 05/17/2013 1659 Full Code [id number]  Theodis Blaze, MD Inpatient   12/04/2012 1723 12/08/2012 1656 Full Code [id number]  Janece Canterbury, MD Inpatient   05/17/2012 1256 05/21/2012 1809 Full Code [id number]  Campbell Lerner, RN ED   04/06/2012 1935 04/14/2012 1723 Full Code [id number]  Marylou Mccoy, RN Inpatient   03/27/2012 0444 03/28/2012 1756 Full Code [id number]  Theotis Barrio, RN Inpatient      TOTAL TIME TAKING CARE OF THIS PATIENT: 31 minutes.    Loletha Grayer M.D on  01/30/2018 at 4:19 PM  Between 7am to 6pm - Pager - [phone]  After 6pm go to [email or web address] - password [email or web address] [facility]  [phone]  CC: Primary care physician; [name], MD

## 2018-02-05 ENCOUNTER — Ambulatory Visit: Payer: Medicare HMO | Admitting: Gastroenterology

## 2018-02-06 ENCOUNTER — Telehealth: Payer: Self-pay | Admitting: Licensed Clinical Social Worker

## 2018-02-06 NOTE — Telephone Encounter (Signed)
EMMI flagged patient for answering yes to feeling sad/hopeless/anxious/empty and yes to loss of interest in things. Clinical Education officer, museum (CSW) was able to reach patient via telephone. Patient sounded agitated and asked why CSW was calling. CSW explained the EMMI call back process and asked him if he was feeling depressed. Patient stated that he lost a lot of blood and is anemic. Patient reported that he is sick and has nothing to look forward to. CSW provided emotional support and offered counseling resources. Patient refused counseling services and reported that he does not need it. Patient reported that he is not having thoughts of hurting himself and would not do anything to himself. Patient reported that he has an appointment with his PCP tomorrow and his wife will transport him. Patient reported that his wife is his caregiver and provides transportation. Patient reported no other needs or concerns. No future call is needed.   [employer], LCSW [phone]

## 2018-02-08 ENCOUNTER — Inpatient Hospital Stay (HOSPITAL_COMMUNITY)
Admission: EM | Admit: 2018-02-08 | Discharge: 2018-02-11 | DRG: 378 | Disposition: A | Discharge status: Home Health | Payer: Medicare HMO | Attending: Internal Medicine | Admitting: Internal Medicine

## 2018-02-08 ENCOUNTER — Encounter (HOSPITAL_COMMUNITY): Payer: Self-pay

## 2018-02-08 ENCOUNTER — Other Ambulatory Visit: Payer: Self-pay

## 2018-02-08 ENCOUNTER — Emergency Department (HOSPITAL_COMMUNITY): Payer: Medicare HMO

## 2018-02-08 DIAGNOSIS — Z880 Allergy status to penicillin: Secondary | ICD-10-CM

## 2018-02-08 DIAGNOSIS — Z9981 Dependence on supplemental oxygen: Secondary | ICD-10-CM

## 2018-02-08 DIAGNOSIS — Z881 Allergy status to other antibiotic agents status: Secondary | ICD-10-CM

## 2018-02-08 DIAGNOSIS — K31811 Angiodysplasia of stomach and duodenum with bleeding: Principal | ICD-10-CM | POA: Diagnosis present

## 2018-02-08 DIAGNOSIS — J449 Chronic obstructive pulmonary disease, unspecified: Secondary | ICD-10-CM | POA: Diagnosis present

## 2018-02-08 DIAGNOSIS — E119 Type 2 diabetes mellitus without complications: Secondary | ICD-10-CM | POA: Diagnosis present

## 2018-02-08 DIAGNOSIS — Z794 Long term (current) use of insulin: Secondary | ICD-10-CM

## 2018-02-08 DIAGNOSIS — I5032 Chronic diastolic (congestive) heart failure: Secondary | ICD-10-CM | POA: Diagnosis present

## 2018-02-08 DIAGNOSIS — Z8249 Family history of ischemic heart disease and other diseases of the circulatory system: Secondary | ICD-10-CM

## 2018-02-08 DIAGNOSIS — Z951 Presence of aortocoronary bypass graft: Secondary | ICD-10-CM

## 2018-02-08 DIAGNOSIS — Z825 Family history of asthma and other chronic lower respiratory diseases: Secondary | ICD-10-CM

## 2018-02-08 DIAGNOSIS — K210 Gastro-esophageal reflux disease with esophagitis: Secondary | ICD-10-CM | POA: Diagnosis present

## 2018-02-08 DIAGNOSIS — K921 Melena: Secondary | ICD-10-CM

## 2018-02-08 DIAGNOSIS — Z888 Allergy status to other drugs, medicaments and biological substances status: Secondary | ICD-10-CM

## 2018-02-08 DIAGNOSIS — K31819 Angiodysplasia of stomach and duodenum without bleeding: Secondary | ICD-10-CM | POA: Diagnosis not present

## 2018-02-08 DIAGNOSIS — J439 Emphysema, unspecified: Secondary | ICD-10-CM | POA: Diagnosis present

## 2018-02-08 DIAGNOSIS — G4700 Insomnia, unspecified: Secondary | ICD-10-CM | POA: Diagnosis present

## 2018-02-08 DIAGNOSIS — D649 Anemia, unspecified: Secondary | ICD-10-CM | POA: Diagnosis not present

## 2018-02-08 DIAGNOSIS — F411 Generalized anxiety disorder: Secondary | ICD-10-CM | POA: Diagnosis present

## 2018-02-08 DIAGNOSIS — D500 Iron deficiency anemia secondary to blood loss (chronic): Secondary | ICD-10-CM | POA: Diagnosis present

## 2018-02-08 DIAGNOSIS — E118 Type 2 diabetes mellitus with unspecified complications: Secondary | ICD-10-CM | POA: Diagnosis present

## 2018-02-08 DIAGNOSIS — F418 Other specified anxiety disorders: Secondary | ICD-10-CM | POA: Diagnosis present

## 2018-02-08 DIAGNOSIS — K228 Other specified diseases of esophagus: Secondary | ICD-10-CM | POA: Diagnosis not present

## 2018-02-08 DIAGNOSIS — K5521 Angiodysplasia of colon with hemorrhage: Secondary | ICD-10-CM

## 2018-02-08 DIAGNOSIS — K449 Diaphragmatic hernia without obstruction or gangrene: Secondary | ICD-10-CM | POA: Diagnosis not present

## 2018-02-08 DIAGNOSIS — D62 Acute posthemorrhagic anemia: Secondary | ICD-10-CM | POA: Diagnosis present

## 2018-02-08 DIAGNOSIS — Z833 Family history of diabetes mellitus: Secondary | ICD-10-CM

## 2018-02-08 DIAGNOSIS — E872 Acidosis: Secondary | ICD-10-CM | POA: Diagnosis present

## 2018-02-08 DIAGNOSIS — Z87891 Personal history of nicotine dependence: Secondary | ICD-10-CM

## 2018-02-08 DIAGNOSIS — Z8679 Personal history of other diseases of the circulatory system: Secondary | ICD-10-CM

## 2018-02-08 DIAGNOSIS — R413 Other amnesia: Secondary | ICD-10-CM | POA: Diagnosis present

## 2018-02-08 DIAGNOSIS — K5710 Diverticulosis of small intestine without perforation or abscess without bleeding: Secondary | ICD-10-CM | POA: Diagnosis present

## 2018-02-08 DIAGNOSIS — K922 Gastrointestinal hemorrhage, unspecified: Secondary | ICD-10-CM | POA: Diagnosis present

## 2018-02-08 DIAGNOSIS — I2510 Atherosclerotic heart disease of native coronary artery without angina pectoris: Secondary | ICD-10-CM | POA: Diagnosis present

## 2018-02-08 DIAGNOSIS — F3162 Bipolar disorder, current episode mixed, moderate: Secondary | ICD-10-CM | POA: Diagnosis present

## 2018-02-08 DIAGNOSIS — E1165 Type 2 diabetes mellitus with hyperglycemia: Secondary | ICD-10-CM | POA: Diagnosis present

## 2018-02-08 DIAGNOSIS — Z885 Allergy status to narcotic agent status: Secondary | ICD-10-CM

## 2018-02-08 DIAGNOSIS — Z6832 Body mass index (BMI) 32.0-32.9, adult: Secondary | ICD-10-CM

## 2018-02-08 DIAGNOSIS — E785 Hyperlipidemia, unspecified: Secondary | ICD-10-CM | POA: Diagnosis present

## 2018-02-08 DIAGNOSIS — E559 Vitamin D deficiency, unspecified: Secondary | ICD-10-CM | POA: Diagnosis present

## 2018-02-08 DIAGNOSIS — I10 Essential (primary) hypertension: Secondary | ICD-10-CM | POA: Diagnosis present

## 2018-02-08 DIAGNOSIS — K3189 Other diseases of stomach and duodenum: Secondary | ICD-10-CM | POA: Diagnosis not present

## 2018-02-08 DIAGNOSIS — I252 Old myocardial infarction: Secondary | ICD-10-CM

## 2018-02-08 DIAGNOSIS — I110 Hypertensive heart disease with heart failure: Secondary | ICD-10-CM | POA: Diagnosis present

## 2018-02-08 DIAGNOSIS — F332 Major depressive disorder, recurrent severe without psychotic features: Secondary | ICD-10-CM | POA: Diagnosis present

## 2018-02-08 DIAGNOSIS — E875 Hyperkalemia: Secondary | ICD-10-CM | POA: Diagnosis present

## 2018-02-08 DIAGNOSIS — K253 Acute gastric ulcer without hemorrhage or perforation: Secondary | ICD-10-CM

## 2018-02-08 DIAGNOSIS — K259 Gastric ulcer, unspecified as acute or chronic, without hemorrhage or perforation: Secondary | ICD-10-CM | POA: Diagnosis not present

## 2018-02-08 LAB — CBC
HCT: 30.6 % — ABNORMAL LOW (ref 39.0–52.0)
HEMOGLOBIN: 9.7 g/dL — AB (ref 13.0–17.0)
MCH: 30.7 pg (ref 26.0–34.0)
MCHC: 31.7 g/dL (ref 30.0–36.0)
MCV: 96.8 fL (ref 78.0–100.0)
PLATELETS: 192 10*3/uL (ref 150–400)
RBC: 3.16 MIL/uL — ABNORMAL LOW (ref 4.22–5.81)
RDW: 19.7 % — AB (ref 11.5–15.5)
WBC: 7.9 10*3/uL (ref 4.0–10.5)

## 2018-02-08 LAB — BASIC METABOLIC PANEL
ANION GAP: 7 (ref 5–15)
BUN: 35 mg/dL — ABNORMAL HIGH (ref 6–20)
CHLORIDE: 94 mmol/L — AB (ref 101–111)
CO2: 29 mmol/L (ref 22–32)
Calcium: 8.5 mg/dL — ABNORMAL LOW (ref 8.9–10.3)
Creatinine, Ser: 1.08 mg/dL (ref 0.61–1.24)
GFR calc non Af Amer: >60 mL/min (ref >60)
GLUCOSE: 423 mg/dL — AB (ref 65–99)
POTASSIUM: 5.4 mmol/L — AB (ref 3.5–5.1)
Sodium: 130 mmol/L — ABNORMAL LOW (ref 135–145)

## 2018-02-08 LAB — GLUCOSE, CAPILLARY
GLUCOSE-CAPILLARY: 181 mg/dL — AB (ref 65–99)
Glucose-Capillary: 366 mg/dL — ABNORMAL HIGH (ref 65–99)
Glucose-Capillary: 297 mg/dL — ABNORMAL HIGH (ref 65–99)

## 2018-02-08 LAB — HEPATIC FUNCTION PANEL
ALBUMIN: 3 g/dL — AB (ref 3.5–5.0)
ALK PHOS: 95 U/L (ref 38–126)
ALT: 14 U/L — AB (ref 17–63)
AST: 13 U/L — AB (ref 15–41)
BILIRUBIN TOTAL: 0.6 mg/dL (ref 0.3–1.2)
Bilirubin, Direct: 0.1 mg/dL (ref 0.1–0.5)
Indirect Bilirubin: 0.5 mg/dL (ref 0.3–0.9)
TOTAL PROTEIN: 6 g/dL — AB (ref 6.5–8.1)

## 2018-02-08 LAB — CBC WITH DIFFERENTIAL/PLATELET
Basophils Absolute: 0 10*3/uL (ref 0.0–0.1)
Basophils Relative: 0 %
EOS ABS: 0.1 10*3/uL (ref 0.0–0.7)
EOS PCT: 1 %
HCT: 16.6 % — ABNORMAL LOW (ref 39.0–52.0)
Hemoglobin: 5.1 g/dL — CL (ref 13.0–17.0)
LYMPHS ABS: 1.1 10*3/uL (ref 0.7–4.0)
Lymphocytes Relative: 11 %
MCH: 31.1 pg (ref 26.0–34.0)
MCHC: 30.7 g/dL (ref 30.0–36.0)
MCV: 101.2 fL — ABNORMAL HIGH (ref 78.0–100.0)
MONOS PCT: 9 %
Monocytes Absolute: 0.9 10*3/uL (ref 0.1–1.0)
Neutro Abs: 7.8 10*3/uL (ref 1.7–7.7)
Neutrophils Relative %: 79 %
Platelets: 263 10*3/uL (ref 150–400)
RBC: 1.64 MIL/uL — ABNORMAL LOW (ref 4.22–5.81)
RDW: 19.5 % — ABNORMAL HIGH (ref 11.5–15.5)
WBC: 9.9 10*3/uL (ref 4.0–10.5)

## 2018-02-08 LAB — PREPARE RBC (CROSSMATCH)

## 2018-02-08 LAB — BRAIN NATRIURETIC PEPTIDE: B NATRIURETIC PEPTIDE 5: 54.2 pg/mL (ref 0.0–100.0)

## 2018-02-08 LAB — I-STAT TROPONIN, ED: Troponin i, poc: 0 ng/mL (ref 0.00–0.08)

## 2018-02-08 LAB — POC OCCULT BLOOD, ED: FECAL OCCULT BLD: POSITIVE — AB

## 2018-02-08 LAB — APTT: aPTT: 24 seconds (ref 24–36)

## 2018-02-08 LAB — CBG MONITORING, ED: GLUCOSE-CAPILLARY: 408 mg/dL — AB (ref 65–99)

## 2018-02-08 LAB — I-STAT CG4 LACTIC ACID, ED: Lactic Acid, Venous: 2.15 mmol/L (ref 0.5–1.9)

## 2018-02-08 LAB — PROTIME-INR
INR: 0.93
Prothrombin Time: 12.4 seconds (ref 11.4–15.2)

## 2018-02-08 MED ORDER — ALPRAZOLAM 0.25 MG PO TABS
0.2500 mg | ORAL_TABLET | Freq: Three times a day (TID) | ORAL | Status: DC | PRN
  Administered 2018-02-08 – 2018-02-10 (×8): 0.25 mg via ORAL
  Filled 2018-02-08 (×8): qty 1

## 2018-02-08 MED ORDER — MOMETASONE FURO-FORMOTEROL FUM 200-5 MCG/ACT IN AERO
2.0000 | INHALATION_SPRAY | Freq: Two times a day (BID) | RESPIRATORY_TRACT | Status: DC
  Administered 2018-02-08 – 2018-02-11 (×6): 2 via RESPIRATORY_TRACT
  Filled 2018-02-08: qty 8.8

## 2018-02-08 MED ORDER — SODIUM CHLORIDE 0.9% FLUSH: 3.0000 mL | INTRAVENOUS | Status: DC | PRN

## 2018-02-08 MED ORDER — SALINE SPRAY 0.65 % NA SOLN
1.0000 | NASAL | Status: DC | PRN
  Filled 2018-02-08: qty 44

## 2018-02-08 MED ORDER — SODIUM CHLORIDE 0.9% FLUSH
3.0000 mL | Freq: Two times a day (BID) | INTRAVENOUS | Status: DC
  Administered 2018-02-08 – 2018-02-11 (×5): 3 mL via INTRAVENOUS

## 2018-02-08 MED ORDER — SODIUM CHLORIDE 0.9 % IV SOLN: INTRAVENOUS | Status: DC

## 2018-02-08 MED ORDER — SODIUM CHLORIDE 0.9 % IV SOLN: 250.0000 mL | INTRAVENOUS | Status: DC | PRN

## 2018-02-08 MED ORDER — SODIUM CHLORIDE 0.9 % IV BOLUS
500.0000 mL | Freq: Once | INTRAVENOUS | Status: AC
  Administered 2018-02-08: 500 mL via INTRAVENOUS

## 2018-02-08 MED ORDER — ALBUTEROL SULFATE HFA 108 (90 BASE) MCG/ACT IN AERS: 2.0000 | INHALATION_SPRAY | Freq: Four times a day (QID) | RESPIRATORY_TRACT | Status: DC | PRN

## 2018-02-08 MED ORDER — GABAPENTIN 300 MG PO CAPS
300.0000 mg | ORAL_CAPSULE | Freq: Three times a day (TID) | ORAL | Status: DC
  Administered 2018-02-08 – 2018-02-11 (×9): 300 mg via ORAL
  Filled 2018-02-08 (×4): qty 1
  Filled 2018-02-08 (×2): qty 3
  Filled 2018-02-08 (×3): qty 1

## 2018-02-08 MED ORDER — SODIUM CHLORIDE 0.9 % IV SOLN
10.0000 mL/h | Freq: Once | INTRAVENOUS | Status: AC
  Administered 2018-02-08: 10 mL/h via INTRAVENOUS

## 2018-02-08 MED ORDER — PANTOPRAZOLE SODIUM 40 MG IV SOLR: 40.0000 mg | Freq: Two times a day (BID) | INTRAVENOUS | Status: DC

## 2018-02-08 MED ORDER — CITALOPRAM HYDROBROMIDE 20 MG PO TABS
20.0000 mg | ORAL_TABLET | Freq: Every day | ORAL | Status: DC
  Administered 2018-02-08 – 2018-02-11 (×4): 20 mg via ORAL
  Filled 2018-02-08 (×4): qty 1

## 2018-02-08 MED ORDER — INSULIN ASPART 100 UNIT/ML ~~LOC~~ SOLN
0.0000 [IU] | Freq: Every day | SUBCUTANEOUS | Status: DC
  Administered 2018-02-08 – 2018-02-10 (×3): 3 [IU] via SUBCUTANEOUS

## 2018-02-08 MED ORDER — IPRATROPIUM-ALBUTEROL 0.5-2.5 (3) MG/3ML IN SOLN
3.0000 mL | Freq: Once | RESPIRATORY_TRACT | Status: AC
  Administered 2018-02-08: 3 mL via RESPIRATORY_TRACT
  Filled 2018-02-08: qty 3

## 2018-02-08 MED ORDER — INSULIN ASPART 100 UNIT/ML ~~LOC~~ SOLN
0.0000 [IU] | Freq: Three times a day (TID) | SUBCUTANEOUS | Status: DC
  Administered 2018-02-08: 3 [IU] via SUBCUTANEOUS
  Administered 2018-02-08 (×2): 15 [IU] via SUBCUTANEOUS
  Administered 2018-02-09: 11 [IU] via SUBCUTANEOUS
  Administered 2018-02-10: 5 [IU] via SUBCUTANEOUS
  Administered 2018-02-10 – 2018-02-11 (×3): 11 [IU] via SUBCUTANEOUS
  Administered 2018-02-11: 8 [IU] via SUBCUTANEOUS
  Filled 2018-02-08: qty 1

## 2018-02-08 MED ORDER — PANTOPRAZOLE SODIUM 40 MG IV SOLR
80.0000 mg | Freq: Once | INTRAVENOUS | Status: AC
  Administered 2018-02-08: 80 mg via INTRAVENOUS
  Filled 2018-02-08: qty 80

## 2018-02-08 MED ORDER — ALBUTEROL SULFATE (2.5 MG/3ML) 0.083% IN NEBU: 2.5000 mg | INHALATION_SOLUTION | Freq: Four times a day (QID) | RESPIRATORY_TRACT | Status: DC | PRN

## 2018-02-08 MED ORDER — SODIUM CHLORIDE 0.9 % IV SOLN
8.0000 mg/h | INTRAVENOUS | Status: DC
  Administered 2018-02-08 (×2): 8 mg/h via INTRAVENOUS
  Filled 2018-02-08 (×4): qty 80

## 2018-02-08 MED ORDER — TIOTROPIUM BROMIDE MONOHYDRATE 18 MCG IN CAPS: 18.0000 ug | ORAL_CAPSULE | Freq: Every day | RESPIRATORY_TRACT | Status: DC | PRN

## 2018-02-08 NOTE — ED Notes (Signed)
Pt's lactic acid was 2.15. Notified RN and MD.

## 2018-02-08 NOTE — ED Notes (Signed)
Pt's demeanor has greatly improved throughout the night, and he apologized to this nurse for the comment he made when he arrived. Pt told this nurse "the care at this hospital is the best he has ever received"
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
## 2018-02-08 NOTE — ED Notes (Signed)
IV team attempted x2 to start IV. Pt became increasingly rude and disrespectful to staff. Refused to allow IV team member to attempt IV again. Pt currently demanding to have PICC line placed. Explained to pt that this is something he may discuss with the doctor.

## 2018-02-08 NOTE — ED Notes (Signed)
Bed: WA20 Expected date: 02/08/18 Expected time: 1:10 AM Means of arrival:  Comments: EMS- GI bleed

## 2018-02-08 NOTE — ED Provider Notes (Signed)
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  Was just discharged from [facility] hospital on 01/30/2018 for the same.  States bloody stool stopped for a couple of days and then restarted.  No bright red blood per rectum.  No vomiting.  No abdominal pain or distention.  Also states that he has been feeling more short of breath than baseline.  Has had some intermittent chest pain but none currently.  During last t admission patient received 2 units of packed red blood cells and 2 iron infusions.  He did not have a colonoscopy or endoscopy during last admission.  States that he is supposed to take iron tablets at home but is not.  Patient seems very agitated about his last admission.  He states that he was upset with the gastroenterologist and with the hospitalist which he does appear to be documented in the discharge summary.  He is demanding not to see certain gastroenterologist here at our facility and demanding to see his pulmonologist Dr. Lake Bells.  Patient reports "it would not be a good idea for that gastroenterologist to see me.  I am an [employer] and it would be dangerous for him".  ROS: See HPI Constitutional: no fever  Eyes: no drainage  ENT: no runny nose   Cardiovascular:   chest pain  Resp:  SOB  GI: no vomiting GU: no dysuria Integumentary: no rash  Allergy: no hives  Musculoskeletal: no leg swelling  Neurological: no slurred speech ROS otherwise negative  PAST MEDICAL HISTORY/PAST SURGICAL HISTORY:  Past Medical History:  Diagnosis Date  . AAA (abdominal aortic aneurysm) ([place])    a. 12/2008 s/p 7cm, endovascular repair with  coiling right hypogastric artery   . Allergic rhinitis, cause unspecified   . AVM (arteriovenous malformation) of colon with hemorrhage   . Bipolar 1 disorder, mixed, moderate ([place]) 04/16/2015  . CAD (coronary artery disease)    a. 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  . Chronic diastolic CHF (congestive heart failure) ([place])    a. 04/2015 Echo: EF 55-60%, no rwma, Gr 1 DD, mild AI.  Marland Kitchen Complication of anesthesia    "if they sedate me for too long, they have to intubate me; then they can't get me to come out of it" (04/03/2017)  . COPD (chronic obstructive pulmonary disease) ([place])    a. GOLD stage IV, started home O2. Severe bullous disease of LUL. Prolonged intubation after surgeries due to COPD.  Marland Kitchen Depression with anxiety 01/14/2013  . Diverticulosis   . Duodenal ulcer   . Emphysema of lung ([place])   . Essential hypertension 08/18/2009   Qualifier: Diagnosis of  By: Doy Mince LPN, Megan    . GERD (gastroesophageal reflux disease)   . GI bleed requiring more than 4 units of blood in 24 hours, ICU, or surgery    a. Hx bleeding gastric polyps, cecal & sigmoid AVMS s/p APC 03/30/14  . History of blood transfusion    "many many many; related to blood loss; anemia"  . Hyperlipidemia   . Insomnia 08/10/2014  . Leucocytosis 12/04/2013  . Memory loss   . Morbid obesity ([place])   . Multiple gastric polyps   .  Myocardial infarction ([place])    "I think I had a minor one when I had the OHS"  . On home oxygen therapy    "7 liters [place] w/oxigenator" (04/03/2017)  . Pneumonia 2017  . Recurrent Microcytic Anemia    a. presumed chronic GI blood loss.  . Type II diabetes mellitus ([place])   . Vitamin D deficiency 08/10/2014    MEDICATIONS:  Prior to Admission medications   Medication Sig Start Date End Date Taking? Authorizing Provider  acetaminophen (TYLENOL) 325 MG tablet Take 650 mg by mouth every 6 (six) hours as needed for mild pain, fever or headache.     [provider]  albuterol (PROVENTIL HFA;VENTOLIN HFA) 108 (90 BASE) MCG/ACT inhaler Inhale 2 puffs into the lungs every 6 (six) hours as needed for wheezing or shortness of breath. 05/13/15   Loletha Grayer, MD  ALPRAZolam Duanne Moron) 0.25 MG tablet Take 1 tablet (0.25 mg total) by mouth 3 (three) times daily as needed for anxiety or sleep. 01/20/18   Henreitta Leber, MD  atorvastatin (LIPITOR) 40 MG tablet Take 40 mg by mouth at bedtime.    [provider]  budesonide-formoterol (SYMBICORT) 160-4.5 MCG/ACT inhaler Inhale 2 puffs into the lungs 2 (two) times daily.     [provider]  cholecalciferol (VITAMIN D) 1000 UNITS tablet Take 1,000 Units by mouth daily.    [provider]  citalopram (CELEXA) 40 MG tablet Take 0.5 tablets (20 mg total) by mouth daily. Patient taking differently: Take 20 mg by mouth See admin instructions. Take 20 mg by mouth 2-4 times daily as needed for agitation. 05/12/17   Mariel Aloe, MD  ferrous sulfate 325 (65 FE) MG tablet Take 1 tablet (325 mg total) by mouth 3 (three) times daily with meals. 01/30/18   Loletha Grayer, MD  folic acid (FOLVITE) 1 MG tablet Take 1 mg by mouth daily.    [provider]  gabapentin (NEURONTIN) 300 MG capsule Take 1 capsule (300 mg total) by mouth 3 (three) times daily for 15 days. 01/20/18 02/04/18  Henreitta Leber, MD  insulin aspart (NOVOLOG) 100 UNIT/ML injection Inject 6 Units into the skin 3 (three) times daily with meals. 01/30/18   Loletha Grayer, MD  insulin glargine (LANTUS) 100 UNIT/ML injection Inject 0.25 mLs (25 Units total) into the skin 2 (two) times daily. 01/30/18   Loletha Grayer, MD  OXYGEN Inhale 6 L into the lungs continuous.    [provider]  pantoprazole (PROTONIX) 40 MG tablet Take 1 tablet (40 mg total) by mouth daily. 06/30/17   Rai, Vernelle Emerald, MD  tiotropium (SPIRIVA) 18 MCG inhalation capsule Place 18 mcg into inhaler and inhale daily as needed (shortness of breath).      [provider]    ALLERGIES:  Allergies  Allergen Reactions  . Morphine And Related Shortness Of Breath, Nausea And Vomiting, Rash and Other (See Comments)    Reaction:  Hallucinations   . Penicillins Anaphylaxis, Hives and Other (See Comments)    Has patient had a PCN reaction causing immediate rash, facial/tongue/throat swelling, SOB or lightheadedness with hypotension: Yes Has patient had a PCN reaction causing severe rash involving mucus membranes or skin necrosis: No Has patient had a PCN reaction that required hospitalization No Has patient had a PCN reaction occurring within the last 10 years: No If all of the above answers are "NO", then may proceed with Cephalosporin use.  Marland Kitchen Zolpidem Shortness Of Breath  . Demerol [  Meperidine] Other (See Comments)    Reaction:  Hallucinations    . Dilaudid [Hydromorphone Hcl] Other (See Comments)    Reaction:  Hallucinations   . Levofloxacin Other (See Comments)    Reaction:  Unknown     SOCIAL HISTORY:  Social History   Tobacco Use  . Smoking status: Former Smoker    Packs/day: 2.00    Years: 50.00    Pack years: 100.00    Types: Cigarettes    Last attempt to quit: 11/18/2008    Years since quitting: 9.2  . Smokeless tobacco: Never Used  Substance Use Topics  . Alcohol use: No    Alcohol/week: 0.0 oz    Comment: quit in ~ 2010    FAMILY HISTORY: Family History  Problem Relation Age of Onset  . Emphysema Mother   . Heart disease Mother   . ALS Father   . Diabetes Sister     EXAM: BP 106/64 (BP Location: Right Arm)   Pulse (!) 111   Temp 98.1 F (36.7 C) (Oral)   Resp 19   Ht 6\' 3"  (1.905 m)   Wt 116.1 kg (256 lb)   SpO2 100%   BMI 32.00 kg/m  CONSTITUTIONAL: Alert and oriented and responds appropriately to questions.  Patient is elderly, obese.  On chronic oxygen.  He is very agitated but easily redirectable. HEAD: Normocephalic EYES: Conjunctivae clear, pupils appear equal, EOMI ENT: normal nose; moist  mucous membranes NECK: Supple, no meningismus, no nuchal rigidity, no LAD  CARD: Regular and tachycardic; S1 and S2 appreciated; no murmurs, no clicks, no rubs, no gallops RESP: Normal chest excursion without splinting or tachypnea; breath sounds clear and equal bilaterally; no wheezes, no rhonchi, no rales, no hypoxia on his normal home oxygen or respiratory distress, speaking full sentences ABD/GI: Normal bowel sounds; non-distended; soft, non-tender, no rebound, no guarding, no peritoneal signs, no hepatosplenomegaly RECTAL:  Normal rectal tone, no gross blood; + melena, guaiac positive, no hemorrhoids appreciated, nontender rectal exam, no fecal impaction BACK:  The back appears normal and is non-tender to palpation, there is no CVA tenderness EXT: Normal ROM in all joints; non-tender to palpation; no edema; normal capillary refill; no cyanosis, no calf tenderness or swelling    SKIN: Normal color for age and race; warm; no rash NEURO: Moves all extremities equally PSYCH: Agitated but redirectable.  MEDICAL DECISION MAKING: Patient here with GI bleed.  He has guaiac positive on examination with melena.  Abdominal exam is benign.  Feels short of breath here but his lungs are clear.  Will give DuoNeb for symptomatic relief and obtain chest x-ray.  May be related to symptomatic anemia.  Hemoglobin pending.  Will type and screen patient.  I feel he will need admission.  ED PROGRESS: Patient's hemoglobin is 5.1.  Will transfuse 3 units of packed red blood cells.  Normal coags and normal platelets.  Not on blood thinners.  Chest x-ray shows changes consistent with COPD.  No signs of volume overload.  No pneumonia.   4:24 AM Discussed patient's case with hospitalist, Dr. Hal Hope.  I have recommended admission and patient (and family if present) agree with this plan. Admitting physician will place admission orders.   I reviewed all nursing notes, vitals, pertinent previous records, EKGs, lab and  urine results, imaging (as available).    EKG Interpretation  Date/Time:  Thursday February 08 2018 03:59:42 EDT Ventricular Rate:  110 PR Interval:    QRS Duration: 125 QT Interval:  349  QTC Calculation: 473 R Axis:   -16 Text Interpretation:  Sinus tachycardia Right bundle branch block No significant change since last tracing Confirmed by Joei Frangos, Cyril Mourning [phone]) on 02/08/2018 4:05:21 AM        CRITICAL CARE Performed by: Cyril Mourning Lonni Dirden   Total critical care time: 45 minutes  Critical care time was exclusive of separately billable procedures and treating other patients.  Critical care was necessary to treat or prevent imminent or life-threatening deterioration.  Critical care was time spent personally by me on the following activities: development of treatment plan with patient and/or surrogate as well as nursing, discussions with consultants, evaluation of patient's response to treatment, examination of patient, obtaining history from patient or surrogate, ordering and performing treatments and interventions, ordering and review of laboratory studies, ordering and review of radiographic studies, pulse oximetry and re-evaluation of patient's condition.    Laporche Martelle, Delice Bison, DO 02/08/18 [phone]

## 2018-02-08 NOTE — ED Triage Notes (Signed)
Per EMS: Pt arrived c/o of dark red "coffee ground" color stool for the past couple days. Reports he is unable to control his bowels starting today. Was seen at [facility] about a week ago for GI bleed. Hx of COPD, baseline 6L of O2. Reports feeling fatigued.

## 2018-02-08 NOTE — ED Notes (Signed)
Pt requested oral swabs for dry mouth and nurse tech went into the room with different options for him, but the patient declined saying "this isn't what I asked for." When registration went in the room after, pt stated that "the person on the phone needs an [place] because nobody is giving me what I want." This nurse and nurse tech went into the room with moist mouth swabs offering care again when the patient started to raise his voice at this nurse. Patient stated that this nurse was "ignorant, obnoxious, rude, and overtalking him." He also stated that "the only way we are going to get along would be to do exactly what he wanted." The mouth swabs were left with the patient.

## 2018-02-08 NOTE — ED Notes (Signed)
Date and time results received: 02/08/18 0346 (use smartphrase ".now" to insert current time)  Test: hgb Critical Value: 5.1  Name of Provider Notified: Dr. Leonides Schanz  Orders Received? Or Actions Taken?: MD notified. Pt will be receiving blood
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
## 2018-02-08 NOTE — ED Notes (Signed)
ED TO INPATIENT HANDOFF REPORT  Name/Age/Gender [name] 77 y.o. male  Code Status Code Status History    Date Active Date Inactive Code Status Order ID Comments User Context   01/28/2018 0641 01/30/2018 2132 Full Code [id number]  Arta Silence, MD Inpatient   01/18/2018 2225 01/20/2018 2210 Full Code [id number]  Lance Coon, MD Inpatient   06/28/2017 1203 06/30/2017 1554 Full Code [id number]  Rondel Jumbo, PA-C ED   05/08/2017 1341 05/11/2017 2127 Full Code [id number]  Waldemar Dickens, MD ED   04/03/2017 1715 04/10/2017 1745 Full Code [id number]  Waldemar Dickens, MD ED   03/30/2017 1927 03/31/2017 1928 Full Code [id number]  Bonnielee Haff, MD Inpatient   02/22/2017 1614 02/24/2017 1829 Full Code [id number]  Fritzi Mandes, MD Inpatient   11/07/2016 1020 11/09/2016 1825 Full Code [id number]  Saundra Shelling, MD Inpatient   10/21/2016 0414 10/25/2016 1503 Full Code [id number]  Harvie Bridge, DO Inpatient   10/09/2016 1152 10/12/2016 1649 Full Code [id number]  Lytle Butte, MD ED   10/09/2016 1105 10/09/2016 1152 DNR [id number]  Lavetta Nielsen, Aaron Mose, MD ED   07/24/2016 0806 07/27/2016 1759 DNR [id number]  Harrie Foreman, MD Inpatient   02/10/2016 1528 02/12/2016 2009 DNR [id number]  Loletha Grayer, MD ED   10/26/2015 0314 11/01/2015 2005 Full Code [id number]  Saundra Shelling, MD Inpatient   08/20/2015 0441 08/22/2015 1649 Full Code [id number]  Hower, Aaron Mose, MD ED   08/20/2015 0254 08/20/2015 0441 Full Code [id number]  Lavetta Nielsen, Aaron Mose, MD ED   07/27/2015 2040 07/29/2015 1829 Full Code [id number]  Hower, Aaron Mose, MD ED   06/30/2015 1240 07/03/2015 1526 Full Code [id number]  Bettey Costa, MD Inpatient   05/19/2015 1459 05/23/2015 1621 Full Code [id number]  Max Sane, MD Inpatient   05/06/2015 1714 05/13/2015 1706 Full Code [id number]  Epifanio Lesches, MD ED   05/03/2015 1229 05/04/2015 1920 Full Code [id number]  Idelle Crouch, MD Inpatient   04/22/2015 0450 04/27/2015 1935 Full Code [id number]  Harrie Foreman, MD  Inpatient   04/17/2015 1916 04/19/2015 1716 Full Code [id number]  Clovis Fredrickson, MD Inpatient   04/10/2015 0912 04/17/2015 1908 Full Code [id number]  Demetrios Loll, MD Inpatient   03/09/2015 1218 03/12/2015 1845 Full Code [id number]  Henreitta Leber, MD Inpatient   08/24/2014 2141 08/25/2014 1720 Full Code [id number]  Ivor Costa, MD ED   04/02/2014 2011 04/04/2014 1710 Full Code [id number]  Velvet Bathe, MD Inpatient   03/18/2014 1544 03/22/2014 1406 Full Code [id number]  Verlee Monte, MD Inpatient   12/04/2013 0613 12/10/2013 1954 Full Code [id number]  Rise Patience, MD Inpatient   10/15/2013 2011 10/18/2013 1840 Full Code [id number]  Fuller Plan, MD Inpatient   09/08/2013 0043 09/10/2013 1530 Full Code [id number]  Toy Baker, MD Inpatient   08/19/2013 1748 08/22/2013 1551 Full Code [id number]  Orson Eva, MD ED   05/15/2013 0126 05/17/2013 1659 Full Code [id number]  Theodis Blaze, MD Inpatient   12/04/2012 1723 12/08/2012 1656 Full Code [id number]  Janece Canterbury, MD Inpatient   05/17/2012 1256 05/21/2012 1809 Full Code [id number]  Campbell Lerner, RN ED   04/06/2012 1935 04/14/2012 1723 Full Code [id number]  Marylou Mccoy, RN Inpatient   03/27/2012 0444 03/28/2012 1756 Full Code [id number]  Theotis Barrio, RN Inpatient      Home/SNF/Other Home  Chief Complaint GI bleed  Level of Care/Admitting Diagnosis ED Disposition    ED Disposition Condition [facility] Area:  [place] [[id number]]  Level of Care: Telemetry [5]  Admit to tele based on following criteria: Monitor for Ischemic changes  Diagnosis: Acute GI bleeding [919166]  Admitting Physician: Rise Patience [id number]  Attending Physician: Rise Patience [phone]  PT Class (Do Not Modify): Observation [104]  PT Acc Code (Do Not Modify): Observation [10022]       Medical History Past Medical History:  Diagnosis Date  . AAA (abdominal aortic aneurysm) ([place])    a. 12/2008 s/p 7cm, endovascular  repair with coiling right hypogastric artery   . Allergic rhinitis, cause unspecified   . AVM (arteriovenous malformation) of colon with hemorrhage   . Bipolar 1 disorder, mixed, moderate ([place]) 04/16/2015  . CAD (coronary artery disease)    a. 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  . Chronic diastolic CHF (congestive heart failure) ([place])    a. 04/2015 Echo: EF 55-60%, no rwma, Gr 1 DD, mild AI.  Marland Kitchen Complication of anesthesia    "if they sedate me for too long, they have to intubate me; then they can't get me to come out of it" (04/03/2017)  . COPD (chronic obstructive pulmonary disease) ([place])    a. GOLD stage IV, started home O2. Severe bullous disease of LUL. Prolonged intubation after surgeries due to COPD.  Marland Kitchen Depression with anxiety 01/14/2013  . Diverticulosis   . Duodenal ulcer   . Emphysema of lung ([place])   . Essential hypertension 08/18/2009   Qualifier: Diagnosis of  By: Doy Mince LPN, Megan    . GERD (gastroesophageal reflux disease)   . GI bleed requiring more than 4 units of blood in 24 hours, ICU, or surgery    a. Hx bleeding gastric polyps, cecal & sigmoid AVMS s/p APC 03/30/14  . History of blood transfusion    "many many many; related to blood loss; anemia"  . Hyperlipidemia   . Insomnia 08/10/2014  . Leucocytosis 12/04/2013  . Memory loss   . Morbid obesity ([place])   . Multiple gastric polyps   . Myocardial infarction ([place])    "I think I had a minor one when I had the OHS"  . On home oxygen therapy    "7 liters [place] w/oxigenator" (04/03/2017)  . Pneumonia 2017  . Recurrent Microcytic Anemia    a. presumed chronic GI blood loss.  . Type II diabetes mellitus ([place])   . Vitamin D deficiency 08/10/2014    Allergies Allergies  Allergen Reactions  . Morphine And Related Shortness Of Breath, Nausea And Vomiting, Rash and Other (See Comments)    Reaction:  Hallucinations   . Penicillins Anaphylaxis, Hives and Other (See Comments)    Has patient had a  PCN reaction causing immediate rash, facial/tongue/throat swelling, SOB or lightheadedness with hypotension: Yes Has patient had a PCN reaction causing severe rash involving mucus membranes or skin necrosis: No Has patient had a PCN reaction that required hospitalization No Has patient had a PCN reaction occurring within the last 10 years: No If all of the above answers are "NO", then may proceed with Cephalosporin use.  Marland Kitchen Zolpidem Shortness Of Breath  . Demerol [Meperidine] Other (See Comments)    Reaction:  Hallucinations    . Dilaudid [Hydromorphone Hcl] Other (See Comments)    Reaction:  Hallucinations   . Levofloxacin Other (See Comments)    Reaction:  Unknown     IV Location/Drains/Wounds Patient Lines/Drains/Airways Status   Active Line/Drains/Airways    Name:  Placement date:   Placement time:   Site:   Days:   Peripheral IV 02/08/18 Left;Upper Arm   02/08/18    0320    Arm   less than 1          Labs/Imaging Results for orders placed or performed during the hospital encounter of 02/08/18 (from the past 48 hour(s))  POC occult blood, ED     Status: Abnormal   Collection Time: 02/08/18  3:04 AM  Result Value Ref Range   Fecal Occult Bld POSITIVE (A) NEGATIVE  CBC with Differential     Status: Abnormal   Collection Time: 02/08/18  3:23 AM  Result Value Ref Range   WBC 9.9 4.0 - 10.5 K/uL   RBC 1.64 (L) 4.22 - 5.81 MIL/uL   Hemoglobin 5.1 (LL) 13.0 - 17.0 g/dL    Comment: REPEATED TO VERIFY CRITICAL RESULT CALLED TO, READ BACK BY AND VERIFIED WITH: L COLES,RN _0  02/08/18 MKELLY    HCT 16.6 (L) 39.0 - 52.0 %   MCV 101.2 (H) 78.0 - 100.0 fL   MCH 31.1 26.0 - 34.0 pg   MCHC 30.7 30.0 - 36.0 g/dL   RDW 19.5 (H) 11.5 - 15.5 %   Platelets 263 150 - 400 K/uL   Neutrophils Relative % 79 %   Neutro Abs 7.8 1.7 - 7.7 K/uL   Lymphocytes Relative 11 %   Lymphs Abs 1.1 0.7 - 4.0 K/uL   Monocytes Relative 9 %   Monocytes Absolute 0.9 0.1 - 1.0 K/uL   Eosinophils  Relative 1 %   Eosinophils Absolute 0.1 0.0 - 0.7 K/uL   Basophils Relative 0 %   Basophils Absolute 0.0 0.0 - 0.1 K/uL    Comment: Performed at [facility], [place] [address] [place], [place] [address]  Basic metabolic panel     Status: Abnormal   Collection Time: 02/08/18  3:23 AM  Result Value Ref Range   Sodium 130 (L) 135 - 145 mmol/L   Potassium 5.4 (H) 3.5 - 5.1 mmol/L   Chloride 94 (L) 101 - 111 mmol/L   CO2 29 22 - 32 mmol/L   Glucose, Bld 423 (H) 65 - 99 mg/dL   BUN 35 (H) 6 - 20 mg/dL   Creatinine, Ser 1.08 0.61 - 1.24 mg/dL   Calcium 8.5 (L) 8.9 - 10.3 mg/dL   GFR calc non Af Amer >60 >60 mL/min   GFR calc Af Amer >60 >60 mL/min    Comment: (NOTE) The eGFR has been calculated using the CKD EPI equation. This calculation has not been validated in all clinical situations. eGFR's persistently <60 mL/min signify possible Chronic Kidney Disease.    Anion gap 7 5 - 15    Comment: Performed at [facility], [place] [address] [place], [place] [address]  Hepatic function panel     Status: Abnormal   Collection Time: 02/08/18  3:23 AM  Result Value Ref Range   Total Protein 6.0 (L) 6.5 - 8.1 g/dL   Albumin 3.0 (L) 3.5 - 5.0 g/dL   AST 13 (L) 15 - 41 U/L   ALT 14 (L) 17 - 63 U/L   Alkaline Phosphatase 95 38 - 126 U/L   Total Bilirubin 0.6 0.3 - 1.2 mg/dL   Bilirubin, Direct 0.1 0.1 - 0.5 mg/dL   Indirect Bilirubin 0.5 0.3 - 0.9 mg/dL    Comment: Performed at [facility], [place] [address] [place], [place] [address]  Protime-INR  Status: None   Collection Time: 02/08/18  3:23 AM  Result Value Ref Range   Prothrombin Time 12.4 11.4 - 15.2 seconds   INR 0.93     Comment: Performed at [facility], [place] [name], [place], [place] [address]  APTT     Status: None   Collection Time: 02/08/18  3:23 AM  Result Value Ref Range   aPTT 24 24 - 36 seconds    Comment: Performed at [facility], [place] [address] [place], [place] [address]  Type and screen     Status: None (Preliminary result)   Collection Time: 02/08/18  3:23 AM  Result Value Ref Range   ABO/RH(D) B NEG    Antibody Screen NEG    Sample Expiration 02/11/2018    Unit Number [id number]    Blood Component Type RED CELLS,LR    Unit division 00    Status of Unit ALLOCATED    Transfusion Status OK TO TRANSFUSE    Crossmatch Result Compatible    Unit Number [id number]    Blood Component Type RBC LR PHER2    Unit division 00    Status of Unit ISSUED    Transfusion Status OK TO TRANSFUSE    Crossmatch Result      Compatible Performed at [facility], [place] [address] [place], [place] [address]   Brain natriuretic peptide     Status: None   Collection Time: 02/08/18  3:23 AM  Result Value Ref Range   B Natriuretic Peptide 54.2 0.0 - 100.0 pg/mL    Comment: Performed at [facility], [place] [address] [place], [place] [address]  I-stat troponin, ED     Status: None   Collection Time: 02/08/18  3:31 AM  Result Value Ref Range   Troponin i, poc 0.00 0.00 - 0.08 ng/mL   Comment 3            Comment: Due to the release kinetics of cTnI, a negative result within the first hours of the onset of symptoms does not rule out myocardial infarction with certainty. If myocardial infarction is still suspected, repeat the test at appropriate intervals.   I-Stat CG4 Lactic Acid, ED     Status: Abnormal   Collection Time: 02/08/18  3:33 AM  Result Value Ref Range   Lactic Acid, Venous 2.15 (HH) 0.5 - 1.9 mmol/L  Prepare RBC     Status: None   Collection Time: 02/08/18  3:47 AM  Result Value Ref Range   Order Confirmation      ORDER PROCESSED BY BLOOD BANK Performed at [facility] [address], [place], [place] [address]    Dg Chest Portable 1 View  Result Date: 02/08/2018 CLINICAL DATA:  Dyspnea and COPD. EXAM: PORTABLE CHEST 1 VIEW COMPARISON:   01/28/2018 FINDINGS: Stable cardiomegaly. Large hiatal hernia is superimposed on the cardiac silhouette. The patient is status post CABG. There is aortic atherosclerosis without aneurysmal dilatation. Pulmonary hyperinflation consistent with COPD. No acute pulmonary consolidation, CHF, effusion or pneumothorax. The left costophrenic angle is excluded on this study. IMPRESSION: Emphysematous hyperinflation of the lungs, similar to prior. Stable cardiomegaly with large hiatal hernia. Aortic atherosclerosis without aneurysm. Electronically Signed   By: Ashley Royalty M.D.   On: 02/08/2018 03:41    Pending Labs Unresulted Labs (From admission, onward)   None      Vitals/Pain Today's Vitals   02/08/18 6579 02/08/18 0383 02/08/18 0615 02/08/18 3383  BP: 112/69 (!) 97/48 110/63 (!) 100/59  Pulse:  (!) 102 (!) 106 (!) 105  Resp: _0 (!) 23  Temp:      TempSrc:      SpO2:  100% 99% 99%  Weight:      Height:        Isolation Precautions No active isolations  Medications Medications  pantoprazole (PROTONIX) 80 mg in sodium chloride 0.9 % 250 mL (0.32 mg/mL) infusion (0 mg/hr Intravenous Paused 02/08/18 0542)  pantoprazole (PROTONIX) injection 40 mg (has no administration in time range)  ipratropium-albuterol (DUONEB) 0.5-2.5 (3) MG/3ML nebulizer solution 3 mL (3 mLs Nebulization Given 02/08/18 0340)  pantoprazole (PROTONIX) 80 mg in sodium chloride 0.9 % 100 mL IVPB (0 mg Intravenous Stopped 02/08/18 0425)  sodium chloride 0.9 % bolus 500 mL (0 mLs Intravenous Stopped 02/08/18 0450)  0.9 %  sodium chloride infusion (10 mL/hr Intravenous New Bag/Given 02/08/18 0542)    Mobility non-ambulatory

## 2018-02-09 ENCOUNTER — Observation Stay (HOSPITAL_COMMUNITY): Payer: Medicare HMO | Admitting: Certified Registered Nurse Anesthetist

## 2018-02-09 ENCOUNTER — Encounter (HOSPITAL_COMMUNITY)
Admission: EM | Disposition: A | Discharge status: Home Health | Payer: Self-pay | Source: Home / Self Care | Attending: Internal Medicine

## 2018-02-09 ENCOUNTER — Encounter (HOSPITAL_COMMUNITY): Payer: Self-pay | Admitting: *Deleted

## 2018-02-09 DIAGNOSIS — Z825 Family history of asthma and other chronic lower respiratory diseases: Secondary | ICD-10-CM | POA: Diagnosis not present

## 2018-02-09 DIAGNOSIS — J439 Emphysema, unspecified: Secondary | ICD-10-CM | POA: Diagnosis present

## 2018-02-09 DIAGNOSIS — K3189 Other diseases of stomach and duodenum: Secondary | ICD-10-CM

## 2018-02-09 DIAGNOSIS — K5710 Diverticulosis of small intestine without perforation or abscess without bleeding: Secondary | ICD-10-CM | POA: Diagnosis present

## 2018-02-09 DIAGNOSIS — E872 Acidosis: Secondary | ICD-10-CM | POA: Diagnosis present

## 2018-02-09 DIAGNOSIS — K228 Other specified diseases of esophagus: Secondary | ICD-10-CM | POA: Diagnosis not present

## 2018-02-09 DIAGNOSIS — F418 Other specified anxiety disorders: Secondary | ICD-10-CM | POA: Diagnosis present

## 2018-02-09 DIAGNOSIS — K921 Melena: Secondary | ICD-10-CM | POA: Diagnosis present

## 2018-02-09 DIAGNOSIS — R413 Other amnesia: Secondary | ICD-10-CM | POA: Diagnosis present

## 2018-02-09 DIAGNOSIS — E785 Hyperlipidemia, unspecified: Secondary | ICD-10-CM | POA: Diagnosis present

## 2018-02-09 DIAGNOSIS — K253 Acute gastric ulcer without hemorrhage or perforation: Secondary | ICD-10-CM | POA: Diagnosis not present

## 2018-02-09 DIAGNOSIS — I2510 Atherosclerotic heart disease of native coronary artery without angina pectoris: Secondary | ICD-10-CM | POA: Diagnosis present

## 2018-02-09 DIAGNOSIS — Z951 Presence of aortocoronary bypass graft: Secondary | ICD-10-CM | POA: Diagnosis not present

## 2018-02-09 DIAGNOSIS — G4700 Insomnia, unspecified: Secondary | ICD-10-CM | POA: Diagnosis present

## 2018-02-09 DIAGNOSIS — K259 Gastric ulcer, unspecified as acute or chronic, without hemorrhage or perforation: Secondary | ICD-10-CM | POA: Diagnosis not present

## 2018-02-09 DIAGNOSIS — Z9981 Dependence on supplemental oxygen: Secondary | ICD-10-CM | POA: Diagnosis not present

## 2018-02-09 DIAGNOSIS — K31819 Angiodysplasia of stomach and duodenum without bleeding: Secondary | ICD-10-CM | POA: Diagnosis not present

## 2018-02-09 DIAGNOSIS — K449 Diaphragmatic hernia without obstruction or gangrene: Secondary | ICD-10-CM

## 2018-02-09 DIAGNOSIS — D62 Acute posthemorrhagic anemia: Secondary | ICD-10-CM | POA: Diagnosis present

## 2018-02-09 DIAGNOSIS — Z8679 Personal history of other diseases of the circulatory system: Secondary | ICD-10-CM | POA: Diagnosis not present

## 2018-02-09 DIAGNOSIS — Z794 Long term (current) use of insulin: Secondary | ICD-10-CM | POA: Diagnosis not present

## 2018-02-09 DIAGNOSIS — K922 Gastrointestinal hemorrhage, unspecified: Secondary | ICD-10-CM | POA: Diagnosis not present

## 2018-02-09 DIAGNOSIS — Z6832 Body mass index (BMI) 32.0-32.9, adult: Secondary | ICD-10-CM | POA: Diagnosis not present

## 2018-02-09 DIAGNOSIS — Z881 Allergy status to other antibiotic agents status: Secondary | ICD-10-CM | POA: Diagnosis not present

## 2018-02-09 DIAGNOSIS — K210 Gastro-esophageal reflux disease with esophagitis: Secondary | ICD-10-CM | POA: Diagnosis present

## 2018-02-09 DIAGNOSIS — F3162 Bipolar disorder, current episode mixed, moderate: Secondary | ICD-10-CM | POA: Diagnosis present

## 2018-02-09 DIAGNOSIS — I252 Old myocardial infarction: Secondary | ICD-10-CM | POA: Diagnosis not present

## 2018-02-09 DIAGNOSIS — I110 Hypertensive heart disease with heart failure: Secondary | ICD-10-CM | POA: Diagnosis present

## 2018-02-09 DIAGNOSIS — K31811 Angiodysplasia of stomach and duodenum with bleeding: Secondary | ICD-10-CM | POA: Diagnosis present

## 2018-02-09 DIAGNOSIS — E559 Vitamin D deficiency, unspecified: Secondary | ICD-10-CM | POA: Diagnosis present

## 2018-02-09 DIAGNOSIS — I5032 Chronic diastolic (congestive) heart failure: Secondary | ICD-10-CM | POA: Diagnosis present

## 2018-02-09 HISTORY — PX: ENTEROSCOPY: SHX5533

## 2018-02-09 LAB — TYPE AND SCREEN
ABO/RH(D): B NEG
ANTIBODY SCREEN: NEGATIVE
Unit division: 0
Unit division: 0
Unit division: 0

## 2018-02-09 LAB — CBC
HCT: 32.4 % — ABNORMAL LOW (ref 39.0–52.0)
HEMATOCRIT: 35.1 % — AB (ref 39.0–52.0)
HEMATOCRIT: 30.7 % — AB (ref 39.0–52.0)
HEMOGLOBIN: 10.8 g/dL — AB (ref 13.0–17.0)
HEMOGLOBIN: 10.1 g/dL — AB (ref 13.0–17.0)
HEMOGLOBIN: 9.4 g/dL — AB (ref 13.0–17.0)
MCH: 30.3 pg (ref 26.0–34.0)
MCH: 30.7 pg (ref 26.0–34.0)
MCH: 30.3 pg (ref 26.0–34.0)
MCHC: 30.8 g/dL (ref 30.0–36.0)
MCHC: 31.2 g/dL (ref 30.0–36.0)
MCHC: 30.6 g/dL (ref 30.0–36.0)
MCV: 98.6 fL (ref 78.0–100.0)
MCV: 98.5 fL (ref 78.0–100.0)
MCV: 99 fL (ref 78.0–100.0)
Platelets: 201 10*3/uL (ref 150–400)
Platelets: 188 10*3/uL (ref 150–400)
Platelets: 186 10*3/uL (ref 150–400)
RBC: 3.56 MIL/uL — AB (ref 4.22–5.81)
RBC: 3.29 MIL/uL — AB (ref 4.22–5.81)
RBC: 3.1 MIL/uL — AB (ref 4.22–5.81)
RDW: 20.1 % — ABNORMAL HIGH (ref 11.5–15.5)
RDW: 19.7 % — ABNORMAL HIGH (ref 11.5–15.5)
RDW: 19.8 % — ABNORMAL HIGH (ref 11.5–15.5)
WBC: 6.6 10*3/uL (ref 4.0–10.5)
WBC: 5.8 10*3/uL (ref 4.0–10.5)
WBC: 6.8 10*3/uL (ref 4.0–10.5)

## 2018-02-09 LAB — BPAM RBC
Blood Product Expiration Date: 201905102359
Blood Product Expiration Date: 201905162359
Blood Product Expiration Date: 201905292359
ISSUE DATE / TIME: 201904250531
ISSUE DATE / TIME: 201904250848
ISSUE DATE / TIME: 201904251506
Unit Type and Rh: 1700
Unit Type and Rh: 1700
Unit Type and Rh: 1700

## 2018-02-09 LAB — GLUCOSE, CAPILLARY
GLUCOSE-CAPILLARY: 369 mg/dL — AB (ref 65–99)
GLUCOSE-CAPILLARY: 307 mg/dL — AB (ref 65–99)
GLUCOSE-CAPILLARY: 288 mg/dL — AB (ref 65–99)
GLUCOSE-CAPILLARY: 336 mg/dL — AB (ref 65–99)
GLUCOSE-CAPILLARY: 272 mg/dL — AB (ref 65–99)
Glucose-Capillary: 260 mg/dL — ABNORMAL HIGH (ref 65–99)
Glucose-Capillary: 346 mg/dL — ABNORMAL HIGH (ref 65–99)

## 2018-02-09 LAB — BASIC METABOLIC PANEL
Anion gap: 11 (ref 5–15)
BUN: 25 mg/dL — ABNORMAL HIGH (ref 6–20)
CALCIUM: 8.1 mg/dL — AB (ref 8.9–10.3)
CO2: 23 mmol/L (ref 22–32)
Chloride: 101 mmol/L (ref 101–111)
Creatinine, Ser: 1.25 mg/dL — ABNORMAL HIGH (ref 0.61–1.24)
GFR, EST NON AFRICAN AMERICAN: 54 mL/min — AB (ref >60)
Glucose, Bld: 363 mg/dL — ABNORMAL HIGH (ref 65–99)
Potassium: 5.9 mmol/L — ABNORMAL HIGH (ref 3.5–5.1)
SODIUM: 135 mmol/L (ref 135–145)

## 2018-02-09 LAB — POTASSIUM: POTASSIUM: 4.6 mmol/L (ref 3.5–5.1)

## 2018-02-09 SURGERY — ENTEROSCOPY
Anesthesia: Monitor Anesthesia Care

## 2018-02-09 MED ORDER — PROPOFOL 10 MG/ML IV BOLUS
INTRAVENOUS | Status: AC
  Filled 2018-02-09: qty 60

## 2018-02-09 MED ORDER — INSULIN ASPART 100 UNIT/ML IV SOLN
10.0000 [IU] | Freq: Once | INTRAVENOUS | Status: AC
  Administered 2018-02-09: 10 [IU] via INTRAVENOUS
  Filled 2018-02-09 (×2): qty 0.1

## 2018-02-09 MED ORDER — PANTOPRAZOLE SODIUM 40 MG IV SOLR
40.0000 mg | Freq: Two times a day (BID) | INTRAVENOUS | Status: DC
  Administered 2018-02-09: 40 mg via INTRAVENOUS
  Filled 2018-02-09: qty 40

## 2018-02-09 MED ORDER — LIDOCAINE 2% (20 MG/ML) 5 ML SYRINGE
INTRAMUSCULAR | Status: DC | PRN
  Administered 2018-02-09: 100 mg via INTRAVENOUS

## 2018-02-09 MED ORDER — CALCIUM GLUCONATE 10 % IV SOLN
1.0000 g | Freq: Once | INTRAVENOUS | Status: AC
  Administered 2018-02-09: 1 g via INTRAVENOUS
  Filled 2018-02-09: qty 10

## 2018-02-09 MED ORDER — SODIUM CHLORIDE 0.9 % IV SOLN: INTRAVENOUS | Status: DC

## 2018-02-09 MED ORDER — PANTOPRAZOLE SODIUM 40 MG PO TBEC
40.0000 mg | DELAYED_RELEASE_TABLET | Freq: Two times a day (BID) | ORAL | Status: DC
  Administered 2018-02-09 – 2018-02-11 (×4): 40 mg via ORAL
  Filled 2018-02-09 (×4): qty 1

## 2018-02-09 MED ORDER — ACETAMINOPHEN 325 MG PO TABS
650.0000 mg | ORAL_TABLET | Freq: Four times a day (QID) | ORAL | Status: DC | PRN
  Administered 2018-02-09 – 2018-02-10 (×2): 650 mg via ORAL
  Filled 2018-02-09 (×2): qty 2

## 2018-02-09 MED ORDER — DEXTROSE 50 % IV SOLN
1.0000 | Freq: Once | INTRAVENOUS | Status: AC
  Administered 2018-02-09: 50 mL via INTRAVENOUS
  Filled 2018-02-09: qty 50

## 2018-02-09 MED ORDER — SODIUM POLYSTYRENE SULFONATE 15 GM/60ML PO SUSP
15.0000 g | Freq: Once | ORAL | Status: DC
  Filled 2018-02-09: qty 60

## 2018-02-09 MED ORDER — PROPOFOL 10 MG/ML IV BOLUS
INTRAVENOUS | Status: DC | PRN
  Administered 2018-02-09 (×2): 30 mg via INTRAVENOUS
  Administered 2018-02-09: 20 mg via INTRAVENOUS
  Administered 2018-02-09 (×4): 30 mg via INTRAVENOUS

## 2018-02-09 SURGICAL SUPPLY — 15 items

## 2018-02-09 NOTE — Addendum Note (Signed)
Addendum  created 02/09/18 1400 by Mitzie Na, CRNA   Intraprocedure Flowsheets edited

## 2018-02-09 NOTE — Transfer of Care (Signed)
Immediate Anesthesia Transfer of Care Note  Patient: [name]  Procedure(s) Performed: ENTEROSCOPY (N/A )  Patient Location: PACU  Anesthesia Type:MAC  Level of Consciousness: awake, alert  and oriented  Airway & Oxygen Therapy: Patient Spontanous Breathing and Patient connected to nasal cannula oxygen  Post-op Assessment: Report given to RN and Post -op Vital signs reviewed and stable  Post vital signs: Reviewed and stable  Last Vitals:  Vitals Value Taken Time  BP 128/41 02/09/2018  1:22 PM  Temp 37.1 C 02/09/2018  1:22 PM  Pulse 113 02/09/2018  1:25 PM  Resp 18 02/09/2018  1:25 PM  SpO2 91 % 02/09/2018  1:25 PM  Vitals shown include unvalidated device data.  Last Pain:  Vitals:   02/09/18 1322  TempSrc: Oral  PainSc: 0-No pain         Complications: No apparent anesthesia complications

## 2018-02-09 NOTE — Addendum Note (Signed)
Addendum  created 02/09/18 1359 by Mitzie Na, CRNA   Attestation recorded in [place], [place] filed, Agricultural consultant edited

## 2018-02-09 NOTE — Anesthesia Postprocedure Evaluation (Signed)
Anesthesia Post Note  Patient: [name]  Procedure(s) Performed: ENTEROSCOPY (N/A )     Patient location during evaluation: PACU Anesthesia Type: MAC Level of consciousness: awake and alert and oriented Pain management: pain level controlled Vital Signs Assessment: post-procedure vital signs reviewed and stable Respiratory status: spontaneous breathing, nonlabored ventilation and respiratory function stable Cardiovascular status: stable and blood pressure returned to baseline Postop Assessment: no apparent nausea or vomiting Anesthetic complications: no    Last Vitals:  Vitals:   02/09/18 1322 02/09/18 1332  BP: (!) 128/41 (!) 118/48  Pulse: (!) 114 (!) 107  Resp: (!) 21 (!) 22  Temp: 37.1 C   SpO2: (!) 89% 97%    Last Pain:  Vitals:   02/09/18 1322  TempSrc: Oral  PainSc: 0-No pain                 Christina Gintz A.

## 2018-02-09 NOTE — Progress Notes (Signed)
Notified by AC [place] to reassess patient's transfer to tele due to MD Patels order. Communicated with bedside RN Miquel Dunn and MD Posey Pronto. EKG obtained and reviewed by MD. Will give dextrose 50 and 10 units of IV Insulin for potassium of 5.9. Will have bedside RN recheck CBG within 9mins after administration to see if patient needs subcutaneous insulin. Also will have another potassium redrawn after interventions to see if patient needs closer tele monitoring.

## 2018-02-09 NOTE — Anesthesia Preprocedure Evaluation (Signed)
Anesthesia Evaluation  Patient identified by MRN, date of birth, ID band Patient awake    Reviewed: Allergy & Precautions, NPO status , Patient's Chart, lab work & pertinent test results  History of Anesthesia Complications (+) history of anesthetic complications  Airway Mallampati: II  TM Distance: >3 FB Neck ROM: Full    Dental  (+) Edentulous Upper, Edentulous Lower   Pulmonary shortness of breath, with exertion, at rest, lying and Long-Term Oxygen Therapy, pneumonia, resolved, COPD,  COPD inhaler and oxygen dependent, former smoker,    Pulmonary exam normal breath sounds clear to auscultation       Cardiovascular hypertension, Pt. 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   Neuro/Psych PSYCHIATRIC DISORDERS Anxiety Depression Bipolar Disorder Peripheral neuropathy    GI/Hepatic Neg liver ROS, PUD, GERD  Medicated and Controlled,Hx/o GI AVM's Gi bleeding   Endo/Other  diabetes, Well Controlled, Type 2, Oral Hypoglycemic Agents, Insulin DependentObesity Hyperlipidemia  Renal/GU Renal InsufficiencyRenal disease  negative genitourinary   Musculoskeletal negative musculoskeletal ROS (+)   Abdominal (+) + obese,   Peds  Hematology  (+) anemia ,   Anesthesia Other Findings   Reproductive/Obstetrics                             Anesthesia Physical  Anesthesia Plan  ASA: IV  Anesthesia Plan: MAC   Post-op Pain Management:    Induction: Intravenous  PONV Risk Score and Plan: 1 and Propofol  Airway Management Planned: Natural Airway and Nasal Cannula  Additional Equipment:   Intra-op Plan:   Post-operative Plan:   Informed Consent: I have reviewed the patients History and Physical, chart, labs  and discussed the procedure including the risks, benefits and alternatives for the proposed anesthesia with the patient or authorized representative who has indicated his/her understanding and acceptance.     Plan Discussed with: CRNA, Anesthesiologist and Surgeon  Anesthesia Plan Comments:         Anesthesia Quick Evaluation

## 2018-02-09 NOTE — Progress Notes (Signed)
Paged MD, Dr.Patel placed Cardiac Monitoring Order. [facility] does not do cardiac monitoring patient will need to be transferred. Waiting for new orders.

## 2018-02-09 NOTE — Op Note (Signed)
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 Hilarie Fredrickson, MD, Elna Breslow, RN, Tinnie Gens,                            Technician Referring MD:             [facility] Medicines:                Monitored Anesthesia Care Complications:            No immediate complications. Estimated Blood Loss:     Estimated blood loss: none. Procedure:                Pre-Anesthesia Assessment:                           - Prior to the procedure, a History and Physical                            was performed, and patient medications and                            allergies were reviewed. The patient's tolerance of                            previous anesthesia was also reviewed. The risks                            and benefits of the procedure and the sedation                            options and risks were discussed with the patient.                            All questions were answered, and informed consent                            was obtained. Prior Anticoagulants: The patient has                            taken no previous anticoagulant or antiplatelet  agents. ASA Grade Assessment: III - A patient with                            severe systemic disease. After reviewing the risks                            and benefits, the patient was deemed in                            satisfactory condition to undergo the procedure.                           After obtaining informed consent, the endoscope was   passed under direct vision. Throughout the                            procedure, the patient's blood pressure, pulse, and                            oxygen saturations were monitored continuously. The                            EC-[id number] ([id number]) scope was introduced through                            the mouth and advanced to the proximal jejunum. The                            small bowel enteroscopy was accomplished without                            difficulty. The patient tolerated the procedure                            well. Scope In: Scope Out: Findings:      The Z-line was irregular and was found 37 cm from the incisors.      The exam of the esophagus was otherwise normal.      A large hiatal hernia with two [name] ulcers was found. Ulcers are not       actively bleeding at time of endoscopy. The proximal extent of the       gastric folds (end of tubular esophagus) was 37 cm from the incisors.       The hiatal narrowing was 44 cm from the incisors.      A single small no bleeding angioectasia was found in the cardia.       Fulguration to ablate the lesion to prevent bleeding by argon plasma at       1 liter/minute and 20 watts was successful.      A diverticulum was found in the second portion of the duodenum.      A single 10 mm submucosal nodule was found in the second portion of the       duodenum.      The exam of the duodenum was otherwise normal.      There was no evidence of significant pathology in the  examined portions       of the proximal jejunum. Impression:               - Esophagus unremarkable, Z-line is irregular, 37                            cm from the incisors.                           - Large hiatal hernia with two Cameron ulcers. Most                            likely source of recent melena.                           - A single non-bleeding angioectasia in the                            stomach. Treated with argon plasma coagulation                             (APC).                           - Duodenal diverticulum.                           - Submucosal duodenal nodule.                           - The examined portion of the jejunum was normal.                           - No specimens collected. Moderate Sedation:      N/A Recommendation:           - Return patient to hospital ward for ongoing care.                           - Resume previous diet.                           - Twice daily PPI.                           - Closely monitor Hgb                           - Replace iron as needed. Procedure Code(s):        --- Professional ---                           [id number], Small intestinal endoscopy, enteroscopy                            beyond second portion of duodenum, not including  ileum; with control of bleeding (eg, injection,                            bipolar cautery, unipolar cautery, laser, heater                            probe, stapler, plasma coagulator) Diagnosis Code(s):        --- Professional ---                           K22.8, Other specified diseases of esophagus                           K44.9, Diaphragmatic hernia without obstruction or                            gangrene                           K25.9, Gastric ulcer, unspecified as acute or                            chronic, without hemorrhage or perforation                           K31.819, Angiodysplasia of stomach and duodenum                            without bleeding                           K31.89, Other diseases of stomach and duodenum                           D62, Acute posthemorrhagic anemia                           K92.1, Melena (includes Hematochezia)                           K57.10, Diverticulosis of small intestine without                            perforation or abscess without bleeding CPT copyright 2017 [employer]. All rights reserved. The codes documented in this report are preliminary  and upon coder review may  be revised to meet current compliance requirements. Jerene Bears, MD 02/09/2018 1:26:13 PM This report has been signed electronically. Number of Addenda: 0

## 2018-02-09 NOTE — Progress Notes (Signed)
[facility] Progress Note  Patient: [name] [id number]:[id number]   PCP: [name], MD DOB: Aug 12, 1941   DOA: 02/08/2018   DOS: 02/09/2018   Date of Service: the patient was seen and examined on 02/09/2018  Subjective: Feeling better.  No abdominal pain.  No nausea no vomiting.  No active bleeding so far in the hospital.  Brief hospital course: Pt. with PMH of obesity, T2IDDM, HTN, HLD, CAD/MI (s/p stents + CABG x4), chronic diastolic CHF (EF 40-08% w/ Gr 1 dias dysfxn, trace AR as of 06/2017), AAA (s/p EVAR + R hypogastric art coiling), colonic AVMs, multiple transfusions, severe COPD (uses 7L [place] oxygenator @ home), GERD, vit D def, Anx/dep; admitted on 02/08/2018, presented with complaint of shortnes of breath, was found to have symptomatic anemia. Currently further plan is monitor H&H.  Assessment and Plan: Active Problems:   Acute blood loss anemia -  Patient to be transfused 3 units of PRBC.  H&H from 5.3-10.8. -  Monitor.    Acute GI bleeding - Most likely from AVM's - Consulted [facility], underwent push enteroscopy, which showed nonbleeding AVM which was cauterized with APC.  Monitor H&H for now.  Patient back on regular diet.  Continue PPI twice a day per GI.    Essential hypertension - no antihypertensive medications on MAR. Currently off antihypertensives and stable    COPD (chronic obstructive pulmonary disease) (HCC) - No wheezing, patient speaking in full sentences. Was short of breath but improving with transfusion. Pt requested Pulmonary consult but I do not see urgent reason to consult Pulmonologist as SOB is related to profound anemia which is being replaced    Chronic GI bleeding - Please see discussion above.    Anxiety state - continue xanax and celexa  Hyperkalemia Likely in the setting of blood transfusion. Received dextrose insulin as well as calcium gluconate. EKG unremarkable. Daily monitoring.    Bipolar 1 disorder, mixed, moderate  (HCC) - continue prior to admission medication regimen. Pt is demanding and tangential today. If continues may warrant psych consult    Major depressive disorder, recurrent, severe without psychotic features ([place]) - Celexa   AVM (arteriovenous malformation) of colon with hemorrhage    Diabetes mellitus with complication ([place]): hyperglycemia not well controlled - SSI, once able to eat place on diabetic diet.  Lactic acidosis - most likely due to limited oxygen carrying capacity from anemia.  Received IV fluids and IV bolus.  Resolved.    Diet: Cardiac diet DVT Prophylaxis: mechanical compression device  Advance goals of care discussion: full code  Family Communication: no family was present at bedside, at the time of interview.   Disposition:  Discharge to home.  Consultants: gastroenterology  Procedures: Push enteroscopy  Antibiotics: Anti-infectives (From admission, onward)   None       Objective: Physical Exam: Vitals:   02/09/18 1210 02/09/18 1322 02/09/18 1332 02/09/18 1340  BP: (!) 104/46 (!) 128/41 (!) 118/48 113/61  Pulse: 93 (!) 114 (!) 107 (!) 103  Resp: 13 (!) 21 (!) 22 (!) 21  Temp: 97.7 F (36.5 C) 98.7 F (37.1 C)    TempSrc: Oral Oral    SpO2: 98% (!) 89% 97% 91%  Weight:      Height:        Intake/Output Summary (Last 24 hours) at 02/09/2018 1802 Last data filed at 02/09/2018 1507 Gross per 24 hour  Intake 617.17 ml  Output 910 ml  Net -292.83 ml   [employer]  02/08/18 0139  Weight: 116.1 kg (256 lb)   General: Alert, Awake and Oriented to Time, Place and Person. Appear in mild distress, affect appropriate Eyes: PERRL, Conjunctiva normal ENT: Oral Mucosa clear moist. Neck: no JVD, no Abnormal Mass Or lumps Cardiovascular: S1 and S2 Present, no Murmur, Peripheral Pulses Present Respiratory: normal respiratory effort, Bilateral Air entry equal and Decreased, no use of accessory muscle, Clear to Auscultation, no Crackles, no  wheezes Abdomen: Bowel Sound present, Soft and no tenderness, no hernia Skin: no redness, no Rash, no induration Extremities: no Pedal edema, no calf tenderness Neurologic: Grossly no focal neuro deficit. Bilaterally Equal motor strength  Data Reviewed: CBC: Recent Labs  Lab 02/08/18 0323 02/08/18 2048 02/09/18 0536 02/09/18 0957  WBC 9.9 7.9 6.6 5.8  NEUTROABS 7.8  --   --   --   HGB 5.1* 9.7* 10.8* 10.1*  HCT 16.6* 30.6* 35.1* 32.4*  MCV 101.2* 96.8 98.6 98.5  PLT 263 192 201 161   Basic Metabolic Panel: Recent Labs  Lab 02/08/18 0323 02/09/18 0354 02/09/18 0957  NA 130* 135  --   K 5.4* 5.9* 4.6  CL 94* 101  --   CO2 29 23  --   GLUCOSE 423* 363*  --   BUN 35* 25*  --   CREATININE 1.08 1.25*  --   CALCIUM 8.5* 8.1*  --     Liver Function Tests: Recent Labs  Lab 02/08/18 0323  AST 13*  ALT 14*  ALKPHOS 95  BILITOT 0.6  PROT 6.0*  ALBUMIN 3.0*   No results for input(s): LIPASE, AMYLASE in the last 168 hours. No results for input(s): AMMONIA in the last 168 hours. Coagulation Profile: Recent Labs  Lab 02/08/18 0323  INR 0.93   Cardiac Enzymes: No results for input(s): CKTOTAL, CKMB, CKMBINDEX, TROPONINI in the last 168 hours. BNP (last 3 results) No results for input(s): PROBNP in the last 8760 hours. CBG: Recent Labs  Lab 02/09/18 0010 02/09/18 0422 02/09/18 0820 02/09/18 1050 02/09/18 1636  GLUCAP 346* 369* 307* 288* 336*   Studies: No results found.  Scheduled Meds: . citalopram  20 mg Oral Daily  . gabapentin  300 mg Oral TID  . insulin aspart  0-15 Units Subcutaneous TID WC  . insulin aspart  0-5 Units Subcutaneous QHS  . mometasone-formoterol  2 puff Inhalation BID  . pantoprazole  40 mg Oral BID AC  . sodium chloride flush  3 mL Intravenous Q12H   Continuous Infusions: . sodium chloride    . sodium chloride     PRN Meds: sodium chloride, albuterol, ALPRAZolam, sodium chloride, sodium chloride flush, tiotropium  Time spent:  35 minutes  Author: Berle Mull, MD [facility] Pager: [phone] 02/09/2018 6:02 PM  If 7PM-7AM, please contact night-coverage at [email or web address], password [facility]

## 2018-02-10 DIAGNOSIS — D62 Acute posthemorrhagic anemia: Secondary | ICD-10-CM

## 2018-02-10 DIAGNOSIS — K253 Acute gastric ulcer without hemorrhage or perforation: Secondary | ICD-10-CM

## 2018-02-10 LAB — CBC
HEMATOCRIT: 32.8 % — AB (ref 39.0–52.0)
HEMOGLOBIN: 10 g/dL — AB (ref 13.0–17.0)
MCH: 30.5 pg (ref 26.0–34.0)
MCHC: 30.5 g/dL (ref 30.0–36.0)
MCV: 100 fL (ref 78.0–100.0)
Platelets: 194 10*3/uL (ref 150–400)
RBC: 3.28 MIL/uL — ABNORMAL LOW (ref 4.22–5.81)
RDW: 19.5 % — AB (ref 11.5–15.5)
WBC: 10.4 10*3/uL (ref 4.0–10.5)

## 2018-02-10 LAB — GLUCOSE, CAPILLARY
GLUCOSE-CAPILLARY: 299 mg/dL — AB (ref 65–99)
GLUCOSE-CAPILLARY: 266 mg/dL — AB (ref 65–99)
Glucose-Capillary: 316 mg/dL — ABNORMAL HIGH (ref 65–99)
Glucose-Capillary: 331 mg/dL — ABNORMAL HIGH (ref 65–99)
Glucose-Capillary: 335 mg/dL — ABNORMAL HIGH (ref 65–99)
Glucose-Capillary: 246 mg/dL — ABNORMAL HIGH (ref 65–99)

## 2018-02-10 MED ORDER — SUCRALFATE 1 GM/10ML PO SUSP
1.0000 g | Freq: Three times a day (TID) | ORAL | Status: DC
  Administered 2018-02-10 – 2018-02-11 (×4): 1 g via ORAL
  Filled 2018-02-10 (×4): qty 10

## 2018-02-10 MED ORDER — FOLIC ACID 1 MG PO TABS
1.0000 mg | ORAL_TABLET | Freq: Every day | ORAL | Status: DC
  Administered 2018-02-10 – 2018-02-11 (×2): 1 mg via ORAL
  Filled 2018-02-10 (×2): qty 1

## 2018-02-10 MED ORDER — ATORVASTATIN CALCIUM 40 MG PO TABS
40.0000 mg | ORAL_TABLET | Freq: Every day | ORAL | Status: DC
  Administered 2018-02-10: 40 mg via ORAL
  Filled 2018-02-10: qty 1

## 2018-02-10 MED ORDER — INSULIN GLARGINE 100 UNIT/ML ~~LOC~~ SOLN
20.0000 [IU] | Freq: Two times a day (BID) | SUBCUTANEOUS | Status: DC
  Administered 2018-02-10 – 2018-02-11 (×3): 20 [IU] via SUBCUTANEOUS
  Filled 2018-02-10 (×4): qty 0.2

## 2018-02-10 MED ORDER — FERROUS SULFATE 325 (65 FE) MG PO TABS
325.0000 mg | ORAL_TABLET | Freq: Three times a day (TID) | ORAL | Status: DC
  Administered 2018-02-10 – 2018-02-11 (×4): 325 mg via ORAL
  Filled 2018-02-10 (×4): qty 1

## 2018-02-10 NOTE — Progress Notes (Signed)
[facility] Progress Note  Patient: [name] CVE:[id number]   PCP: [name], MD DOB: Dec 05, 1940   DOA: 02/08/2018   DOS: 02/10/2018   Date of Service: the patient was seen and examined on 02/10/2018  Subjective: No nausea no vomiting.  Eating okay.  No diarrhea.  No constipation.  No active bleeding reported.  Breathing is about the same.  Brief hospital course: Pt. with PMH of obesity, T2IDDM, HTN, HLD, CAD/MI (s/p stents + CABG x4), chronic diastolic CHF (EF 02-58% w/ Gr 1 dias dysfxn, trace AR as of 06/2017), AAA (s/p EVAR + R hypogastric art coiling), colonic AVMs, multiple transfusions, severe COPD (uses 7L [place] oxygenator @ home), GERD, vit D def, Anx/dep; admitted on 02/08/2018, presented with complaint of shortnes of breath, was found to have symptomatic anemia. Currently further plan is monitor H&H.  Assessment and Plan: Active Problems:   Acute blood loss anemia -  Patient transfused 3 units of PRBC.  H&H from 5.3-10.8. -  Monitor overnight per GI recommendation. Enzyme deficiency.  Received IV Venofer 400 mg x 2 on 01/28/2018 and 01/30/2018.  Outpatient hematology referral.    Acute GI bleeding - Most likely from AVM's - Consulted [facility], underwent push enteroscopy, which showed nonbleeding AVM which was cauterized with APC.  Monitor H&H for now.  Patient back on regular diet.  Continue PPI twice a day per GI.    Essential hypertension - no antihypertensive medications on MAR. Currently off antihypertensives and stable    COPD (chronic obstructive pulmonary disease) (HCC) - No wheezing, patient speaking in full sentences. Was short of breath but improving with transfusion.     Chronic GI bleeding - Please see discussion above.    Anxiety state - continue xanax and celexa  Hyperkalemia Likely in the setting of blood transfusion. Received dextrose insulin as well as calcium gluconate. EKG unremarkable. monitoring.    Bipolar 1 disorder, mixed,  moderate (HCC) - continue prior to admission medication regimen.     Major depressive disorder, recurrent, severe without psychotic features ([place]) - Celexa   AVM (arteriovenous malformation) of colon with hemorrhage    Diabetes mellitus with complication ([place]): hyperglycemia not well controlled - SSI, once able to eat place on diabetic diet.  Lactic acidosis - most likely due to limited oxygen carrying capacity from anemia.  Received IV fluids and IV bolus.  Resolved.    Diet: Cardiac diet DVT Prophylaxis: mechanical compression device  Advance goals of care discussion: full code  Family Communication: no family was present at bedside, at the time of interview.   Disposition:  Discharge to home.  Consultants: gastroenterology  Procedures: Push enteroscopy  Antibiotics: Anti-infectives (From admission, onward)   None       Objective: Physical Exam: Vitals:   02/10/18 0754 02/10/18 0900 02/10/18 0933 02/10/18 1440  BP:   (!) 112/56 (!) 101/53  Pulse:   (!) 103 98  Resp:   18 16  Temp:  98 F (36.7 C) 98.1 F (36.7 C) 98 F (36.7 C)  TempSrc:  Oral Oral Oral  SpO2: 98%  96% 97%  Weight:      Height:        Intake/Output Summary (Last 24 hours) at 02/10/2018 1522 Last data filed at 02/10/2018 1238 Gross per 24 hour  Intake 1620 ml  Output 1650 ml  Net -30 ml   [employer]   02/08/18 0139  Weight: 116.1 kg (256 lb)   General: Alert, Awake and Oriented to  Time, Place and Person. Appear in mild distress, affect appropriate Eyes: PERRL, Conjunctiva normal ENT: Oral Mucosa clear moist. Neck: no JVD, no Abnormal Mass Or lumps Cardiovascular: S1 and S2 Present, no Murmur, Peripheral Pulses Present Respiratory: normal respiratory effort, Bilateral Air entry equal and Decreased, no use of accessory muscle, Clear to Auscultation, no Crackles, no wheezes Abdomen: Bowel Sound present, Soft and no tenderness, no hernia Skin: no redness, no Rash, no  induration Extremities: no Pedal edema, no calf tenderness Neurologic: Grossly no focal neuro deficit. Bilaterally Equal motor strength  Data Reviewed: CBC: Recent Labs  Lab 02/08/18 0323 02/08/18 2048 02/09/18 0536 02/09/18 0957 02/09/18 2036 02/10/18 0531  WBC 9.9 7.9 6.6 5.8 6.8 10.4  NEUTROABS 7.8  --   --   --   --   --   HGB 5.1* 9.7* 10.8* 10.1* 9.4* 10.0*  HCT 16.6* 30.6* 35.1* 32.4* 30.7* 32.8*  MCV 101.2* 96.8 98.6 98.5 99.0 100.0  PLT 263 192 201 188 186 812   Basic Metabolic Panel: Recent Labs  Lab 02/08/18 0323 02/09/18 0354 02/09/18 0957  NA 130* 135  --   K 5.4* 5.9* 4.6  CL 94* 101  --   CO2 29 23  --   GLUCOSE 423* 363*  --   BUN 35* 25*  --   CREATININE 1.08 1.25*  --   CALCIUM 8.5* 8.1*  --     Liver Function Tests: Recent Labs  Lab 02/08/18 0323  AST 13*  ALT 14*  ALKPHOS 95  BILITOT 0.6  PROT 6.0*  ALBUMIN 3.0*   No results for input(s): LIPASE, AMYLASE in the last 168 hours. No results for input(s): AMMONIA in the last 168 hours. Coagulation Profile: Recent Labs  Lab 02/08/18 0323  INR 0.93   Cardiac Enzymes: No results for input(s): CKTOTAL, CKMB, CKMBINDEX, TROPONINI in the last 168 hours. BNP (last 3 results) No results for input(s): PROBNP in the last 8760 hours. CBG: Recent Labs  Lab 02/09/18 2355 02/10/18 0328 02/10/18 0727 02/10/18 1112 02/10/18 1201  GLUCAP 260* 299* 316* 331* 335*   Studies: No results found.  Scheduled Meds: . atorvastatin  40 mg Oral QHS  . citalopram  20 mg Oral Daily  . ferrous sulfate  325 mg Oral TID WC  . folic acid  1 mg Oral Daily  . gabapentin  300 mg Oral TID  . insulin aspart  0-15 Units Subcutaneous TID WC  . insulin aspart  0-5 Units Subcutaneous QHS  . insulin glargine  20 Units Subcutaneous BID  . mometasone-formoterol  2 puff Inhalation BID  . pantoprazole  40 mg Oral BID AC  . sodium chloride flush  3 mL Intravenous Q12H  . sucralfate  1 g Oral TID WC & HS    Continuous Infusions: . sodium chloride     PRN Meds: sodium chloride, acetaminophen, albuterol, ALPRAZolam, sodium chloride, sodium chloride flush, tiotropium  Time spent: 35 minutes  Author: Berle Mull, MD [facility] Pager: [phone] 02/10/2018 3:22 PM  If 7PM-7AM, please contact night-coverage at [email or web address], password [facility]

## 2018-02-10 NOTE — Progress Notes (Signed)
    Progress Note   Subjective  Chief Complaint: IDA, melena  Patient reports two further black stools last night, was able to eat breakfast and lunch with no complaints.  Does have continued shortness of breath exacerbated by sitting up in bed for lunch.  Explains that he does not want to be discharged too early like he was recently from [facility].  Patient does not have a ride home until early as tomorrow.   Objective   Vital signs in last 24 hours: Temp:  [97.5 F (36.4 C)-98.7 F (37.1 C)] 98 F (36.7 C) (04/27 0900) Pulse Rate:  [85-114] 103 (04/27 0933) Resp:  [18-22] 18 (04/27 0933) BP: (97-128)/(41-73) 112/56 (04/27 0933) SpO2:  [89 %-100 %] 96 % (04/27 0933) Last BM Date: 02/09/18 General:    Caucasian male in NAD Heart:  Regular rate and rhythm; no murmurs Lungs: Respirations even and unlabored, lungs CTA bilaterally, O2 via [place] Abdomen:  Soft, nontender and nondistended. Normal bowel sounds. Extremities:  Without edema. Neurologic:  Alert and oriented,  grossly normal neurologically. Psych:  Cooperative. Normal mood and affect.  Intake/Output from previous day: 04/26 0701 - 04/27 0700 In: 1233 [P.O.:1230; I.V.:3] Out: 1400 [Urine:1400] Intake/Output this shift: Total I/O In: 390 [P.O.:390] Out: 450 [Urine:450]  Lab Results: Recent Labs    02/09/18 0957 02/09/18 2036 02/10/18 0531  WBC 5.8 6.8 10.4  HGB 10.1* 9.4* 10.0*  HCT 32.4* 30.7* 32.8*  PLT 188 186 194   BMET Recent Labs    02/08/18 0323 02/09/18 0354 02/09/18 0957  NA 130* 135  --   K 5.4* 5.9* 4.6  CL 94* 101  --   CO2 29 23  --   GLUCOSE 423* 363*  --   BUN 35* 25*  --   CREATININE 1.08 1.25*  --   CALCIUM 8.5* 8.1*  --    LFT Recent Labs    02/08/18 0323  PROT 6.0*  ALBUMIN 3.0*  AST 13*  ALT 14*  ALKPHOS 95  BILITOT 0.6  BILIDIR 0.1  IBILI 0.5   PT/INR Recent Labs    02/08/18 0323  LABPROT 12.4  INR 0.93   Small bowel enteroscopy 02/09/2018 Impression:  Unremarkable esophagus, regular Z line at 37 cm from incisors, 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  Recurrent GI bleed (melena): EGD yesterday as above with 2 Cameron ulcers and angiectasia in the stomach, two further black stools overnight, hemoglobin is stable at 10 this morning 2.  Chronic blood loss anemia 3.  Advanced COPD  Plan: 1.  Continue to observe overnight with monitoring of hemoglobin and transfusion less than 7 2.  Continue twice daily PPI, replace iron as needed 3.  Please await further recommendations from Dr. Hilarie Fredrickson today  Thank you for your kind consultation, we will continue to follow along.   LOS: 1 day   Levin Erp  02/10/2018, 12:33 PM  Pager # [phone]

## 2018-02-11 LAB — CBC WITH DIFFERENTIAL/PLATELET
BASOS ABS: 0 10*3/uL (ref 0.0–0.1)
BASOS PCT: 0 %
EOS PCT: 2 %
Eosinophils Absolute: 0.1 10*3/uL (ref 0.0–0.7)
HCT: 32.8 % — ABNORMAL LOW (ref 39.0–52.0)
Hemoglobin: 9.9 g/dL — ABNORMAL LOW (ref 13.0–17.0)
Lymphocytes Relative: 10 %
Lymphs Abs: 0.7 10*3/uL (ref 0.7–4.0)
MCH: 30.4 pg (ref 26.0–34.0)
MCHC: 30.2 g/dL (ref 30.0–36.0)
MCV: 100.6 fL — ABNORMAL HIGH (ref 78.0–100.0)
MONO ABS: 0.7 10*3/uL (ref 0.1–1.0)
Monocytes Relative: 10 %
Neutro Abs: 5.3 10*3/uL (ref 1.7–7.7)
Neutrophils Relative %: 78 %
PLATELETS: 185 10*3/uL (ref 150–400)
RBC: 3.26 MIL/uL — ABNORMAL LOW (ref 4.22–5.81)
RDW: 18.9 % — AB (ref 11.5–15.5)
WBC: 6.7 10*3/uL (ref 4.0–10.5)

## 2018-02-11 LAB — BASIC METABOLIC PANEL
ANION GAP: 8 (ref 5–15)
BUN: 17 mg/dL (ref 6–20)
CALCIUM: 8.3 mg/dL — AB (ref 8.9–10.3)
CO2: 29 mmol/L (ref 22–32)
Chloride: 102 mmol/L (ref 101–111)
Creatinine, Ser: 1.08 mg/dL (ref 0.61–1.24)
GLUCOSE: 351 mg/dL — AB (ref 65–99)
Potassium: 5.1 mmol/L (ref 3.5–5.1)
Sodium: 139 mmol/L (ref 135–145)

## 2018-02-11 LAB — GLUCOSE, CAPILLARY
GLUCOSE-CAPILLARY: 312 mg/dL — AB (ref 65–99)
Glucose-Capillary: 278 mg/dL — ABNORMAL HIGH (ref 65–99)

## 2018-02-11 MED ORDER — SUCRALFATE 1 GM/10ML PO SUSP: 1.0000 g | Freq: Three times a day (TID) | ORAL | 0 refills | Status: DC

## 2018-02-11 NOTE — Progress Notes (Signed)
Discharge Planning:  Please see previous NCM notes. Pt active with [facility] for [facility]. Faxed resumption of care order to [facility] agency. Contacted [place] agency to make aware. Contacted wife and left HIPAA compliant vm. Jonnie Finner RN CCM Case Mgmt phone [phone]

## 2018-02-12 ENCOUNTER — Encounter (HOSPITAL_COMMUNITY): Payer: Self-pay | Admitting: Internal Medicine

## 2018-02-13 NOTE — Discharge Summary (Signed)
[facility] Discharge Summary   Patient: [name]:[id number]   PCP: Howard Dy, MD DOB: 07-Oct-1941   Date of admission: 02/08/2018   Date of discharge: 02/11/2018    Discharge Diagnoses:  Active Problems:   Essential hypertension   COPD (chronic obstructive pulmonary disease) (HCC)   Chronic GI bleeding   Anxiety state   Acute blood loss anemia   Bipolar 1 disorder, mixed, moderate (HCC)   Major depressive disorder, recurrent, severe without psychotic features ([place])   AVM (arteriovenous malformation) of colon with hemorrhage   Diabetes mellitus with complication ([place])   Acute GI bleeding   Acute [name] ulcer   Admitted From: home Disposition:  home  Recommendations for Outpatient Follow-up:  1. Please follow-up with PCP and hematology as recommended.  Follow-up Information    Howard Dy, MD. Schedule an appointment as soon as possible for a visit in 1 week(s).   Specialty:  Internal Medicine Contact information: [address] [place] [address] [phone]          Diet recommendation: cardiac diet  Activity: The patient is advised to gradually reintroduce usual activities.  Discharge Condition: good  Code Status: full code  History of present illness: As per the H and P dictated on admission, "[name] is a 77 y.o. male with medical history significant of obesity, T2IDDM, HTN, HLD, CAD/MI (s/p stents + CABG x4), chronic diastolic CHF (EF 39-76% w/ Gr 1 dias dysfxn, trace AR as of 06/2017), AAA (s/p EVAR + R hypogastric art coiling), colonic AVMs, multiple transfusions, severe COPD (uses 7L [place] oxygenator @ home), GERD, vit D def, Anx/dep who p/w symptomatic anemia secondary to acute blood loss from GI tract. Pt is a poor historian and spends most of his time complaining about prior care and how he thinks he should be cared for. I had to redirect several times in order to get some pertinent history. He reports 8 days of  worsening weakness and shortness of breath. Nothing he is aware of makes it better or worse. He admits to having melena. The problem has been persistent and gradually getting worse."  Hospital Course:  Summary of his active problems in the hospital is as following. Acute blood loss anemia - Patient transfused 3 units of PRBC.  H&H from 5.3-10.8. - Monitor overnight per GI recommendation. Enzyme deficiency.  Received IV Venofer 400 mg x 2 on 01/28/2018 and 01/30/2018.  Outpatient hematology referral.  Acute GI bleeding - Most likely from AVM's - Consulted [facility], underwent push enteroscopy, which showed nonbleeding AVM which was cauterized with APC.  Monitor H&H for now.  Patient back on regular diet.  Continue PPI twice a day per GI.  Essential hypertension - no antihypertensive medications on MAR. Currently off antihypertensives and stable  COPD (chronic obstructive pulmonary disease) (HCC) - No wheezing, patient speaking in full sentences. Was short of breath but improving with transfusion.   Chronic GI bleeding - Please see discussion above.  Anxiety state - continue xanax and celexa  Hyperkalemia Likely in the setting of blood transfusion. Received dextrose insulin as well as calcium gluconate. EKG unremarkable.  Bipolar 1 disorder, mixed, moderate (HCC) - continue prior to admission medication regimen.   Major depressive disorder, recurrent, severe without psychotic features ([place]) - Celexa  AVM (arteriovenous malformation) of colon with hemorrhage H&H stable S/P push enteroscopy. Follow-up with hematology as an outpatient to establish care for iron infusions.  Diabetes mellitus with complication ([place]): hyperglycemia not well  controlled -Continue home regimen on discharge  Lactic acidosis - most likely due to limited oxygen carrying capacity from anemia.  Received IV fluids and IV bolus.  Resolved.  All other chronic medical condition  were stable during the hospitalization.  Patient was ambulatory without any assistance. On the day of the discharge the patient's vitals were stable , and no other acute medical condition were reported by patient. the patient was felt safe to be discharge at home with home health.  Consultants: gastroenterology  Procedures: Push enteroscopy, PRBC transfusion  DISCHARGE MEDICATION: Allergies as of 02/11/2018      Reactions   Morphine And Related Shortness Of Breath, Nausea And Vomiting, Rash, Other (See Comments)   Reaction:  Hallucinations    Penicillins Anaphylaxis, Hives, Other (See Comments)   Has patient had a PCN reaction causing immediate rash, facial/tongue/throat swelling, SOB or lightheadedness with hypotension: Yes Has patient had a PCN reaction causing severe rash involving mucus membranes or skin necrosis: No Has patient had a PCN reaction that required hospitalization No Has patient had a PCN reaction occurring within the last 10 years: No If all of the above answers are "NO", then may proceed with Cephalosporin use.   Zolpidem Shortness Of Breath   Demerol [meperidine] Other (See Comments)   Reaction:  Hallucinations     Dilaudid [hydromorphone Hcl] Other (See Comments)   Reaction:  Hallucinations    Levofloxacin Other (See Comments)   Reaction:  Unknown       Medication List    TAKE these medications   acetaminophen 325 MG tablet Commonly known as:  TYLENOL Take 650 mg by mouth every 6 (six) hours as needed for mild pain, fever or headache.   albuterol 108 (90 Base) MCG/ACT inhaler Commonly known as:  PROVENTIL HFA;VENTOLIN HFA Inhale 2 puffs into the lungs every 6 (six) hours as needed for wheezing or shortness of breath.   ALPRAZolam 0.25 MG tablet Commonly known as:  XANAX Take 1 tablet (0.25 mg total) by mouth 3 (three) times daily as needed for anxiety or sleep.   atorvastatin 40 MG tablet Commonly known as:  LIPITOR Take 40 mg by mouth at bedtime.     cholecalciferol 1000 units tablet Commonly known as:  VITAMIN D Take 1,000 Units by mouth daily.   citalopram 40 MG tablet Commonly known as:  CELEXA Take 0.5 tablets (20 mg total) by mouth daily. What changed:    when to take this  additional instructions   ferrous sulfate 325 (65 FE) MG tablet Take 1 tablet (325 mg total) by mouth 3 (three) times daily with meals.   folic acid 1 MG tablet Commonly known as:  FOLVITE Take 1 mg by mouth daily.   gabapentin 300 MG capsule Commonly known as:  NEURONTIN Take 1 capsule (300 mg total) by mouth 3 (three) times daily for 15 days.   insulin aspart 100 UNIT/ML injection Commonly known as:  novoLOG Inject 6 Units into the skin 3 (three) times daily with meals.   insulin glargine 100 UNIT/ML injection Commonly known as:  LANTUS Inject 0.25 mLs (25 Units total) into the skin 2 (two) times daily. What changed:  how much to take   OXYGEN Inhale 6 L into the lungs continuous.   pantoprazole 40 MG tablet Commonly known as:  PROTONIX Take 1 tablet (40 mg total) by mouth daily.   sucralfate 1 GM/10ML suspension Commonly known as:  CARAFATE Take 10 mLs (1 g total) by mouth 4 (four) times  daily -  with meals and at bedtime.   SYMBICORT 160-4.5 MCG/ACT inhaler Generic drug:  budesonide-formoterol Inhale 2 puffs into the lungs 2 (two) times daily.   tiotropium 18 MCG inhalation capsule Commonly known as:  SPIRIVA Place 18 mcg into inhaler and inhale daily as needed (shortness of breath).      Allergies  Allergen Reactions  . Morphine And Related Shortness Of Breath, Nausea And Vomiting, Rash and Other (See Comments)    Reaction:  Hallucinations   . Penicillins Anaphylaxis, Hives and Other (See Comments)    Has patient had a PCN reaction causing immediate rash, facial/tongue/throat swelling, SOB or lightheadedness with hypotension: Yes Has patient had a PCN reaction causing severe rash involving mucus membranes or skin  necrosis: No Has patient had a PCN reaction that required hospitalization No Has patient had a PCN reaction occurring within the last 10 years: No If all of the above answers are "NO", then may proceed with Cephalosporin use.  Marland Kitchen Zolpidem Shortness Of Breath  . Demerol [Meperidine] Other (See Comments)    Reaction:  Hallucinations    . Dilaudid [Hydromorphone Hcl] Other (See Comments)    Reaction:  Hallucinations   . Levofloxacin Other (See Comments)    Reaction:  Unknown    Discharge Instructions    Ambulatory referral to Hematology   Complete by:  As directed    Diet - low sodium heart healthy   Complete by:  As directed    Discharge instructions   Complete by:  As directed    It is important that you read following instructions as well as go over your medication list with RN to help you understand your care after this hospitalization.  Discharge Instructions: Please follow-up with PCP in one week  Please request your primary care physician to go over all Hospital Tests and Procedure/Radiological results at the follow up,  Please get all Hospital records sent to your PCP by signing hospital release before you go home.   Do not take more than prescribed Pain, Sleep and Anxiety Medications. You were cared for by a hospitalist during your hospital stay. If you have any questions about your discharge medications or the care you received while you were in the hospital after you are discharged, you can call the unit and ask to speak with the hospitalist on call if the hospitalist that took care of you is not available.  Once you are discharged, your primary care physician will handle any further medical issues. Please note that NO REFILLS for any discharge medications will be authorized once you are discharged, as it is imperative that you return to your primary care physician (or establish a relationship with a primary care physician if you do not have one) for your aftercare needs so that  they can reassess your need for medications and monitor your lab values. You Must read complete instructions/literature along with all the possible adverse reactions/side effects for all the Medicines you take and that have been prescribed to you. Take any new Medicines after you have completely understood and accept all the possible adverse reactions/side effects. Wear Seat belts while driving. If you have smoked or chewed Tobacco in the last 2 yrs please stop smoking and/or stop any Recreational drug use.   Increase activity slowly   Complete by:  As directed      Discharge Exam: Filed Weights   02/08/18 0139  Weight: 116.1 kg (256 lb)   Vitals:   02/11/18 0910 02/11/18 4097  BP:  115/73  Pulse:  96  Resp:  14  Temp:  98 F (36.7 C)  SpO2: 99% 98%   General: Appear in no distress, no Rash; Oral Mucosa moist. Cardiovascular: S1 and S2 Present, no Murmur, no JVD Respiratory: Bilateral Air entry present and Clear to Auscultation, no Crackles, no wheezes Abdomen: Bowel Sound present, Soft and no tenderness Extremities: no Pedal edema, no calf tenderness Neurology: Grossly no focal neuro deficit.  The results of significant diagnostics from this hospitalization (including imaging, microbiology, ancillary and laboratory) are listed below for reference.    Significant Diagnostic Studies: Dg Chest 1 View  Result Date: 01/28/2018 CLINICAL DATA:  Acute onset of shortness of breath. EXAM: CHEST  1 VIEW COMPARISON:  Radiograph 06/28/2017.  CT 04/18/2017 FINDINGS: Post median sternotomy. Unchanged heart size and mediastinal contours with large retrocardiac hiatal hernia. Mild hyperinflation appears chronic. Left apical bleb. Mild bibasilar atelectasis without confluent airspace disease. No pulmonary edema or large pleural effusion. No pneumothorax. Remote right rib fracture. IMPRESSION: 1. No acute findings. 2. Large hiatal hernia. Electronically Signed   By: Jeb Levering M.D.   On:  01/28/2018 02:26   Dg Chest Portable 1 View  Result Date: 02/08/2018 CLINICAL DATA:  Dyspnea and COPD. EXAM: PORTABLE CHEST 1 VIEW COMPARISON:  01/28/2018 FINDINGS: Stable cardiomegaly. Large hiatal hernia is superimposed on the cardiac silhouette. The patient is status post CABG. There is aortic atherosclerosis without aneurysmal dilatation. Pulmonary hyperinflation consistent with COPD. No acute pulmonary consolidation, CHF, effusion or pneumothorax. The left costophrenic angle is excluded on this study. IMPRESSION: Emphysematous hyperinflation of the lungs, similar to prior. Stable cardiomegaly with large hiatal hernia. Aortic atherosclerosis without aneurysm. Electronically Signed   By: Ashley Royalty M.D.   On: 02/08/2018 03:41    Microbiology: No results found for this or any previous visit (from the past 240 hour(s)).   Labs: CBC: Recent Labs  Lab 02/08/18 0323  02/09/18 0536 02/09/18 0957 02/09/18 2036 02/10/18 0531 02/11/18 0859  WBC 9.9   < > 6.6 5.8 6.8 10.4 6.7  NEUTROABS 7.8  --   --   --   --   --  5.3  HGB 5.1*   < > 10.8* 10.1* 9.4* 10.0* 9.9*  HCT 16.6*   < > 35.1* 32.4* 30.7* 32.8* 32.8*  MCV 101.2*   < > 98.6 98.5 99.0 100.0 100.6*  PLT 263   < > 201 188 186 194 185   < > = values in this interval not displayed.   Basic Metabolic Panel: Recent Labs  Lab 02/08/18 0323 02/09/18 0354 02/09/18 0957 02/11/18 0859  NA 130* 135  --  139  K 5.4* 5.9* 4.6 5.1  CL 94* 101  --  102  CO2 29 23  --  29  GLUCOSE 423* 363*  --  351*  BUN 35* 25*  --  17  CREATININE 1.08 1.25*  --  1.08  CALCIUM 8.5* 8.1*  --  8.3*   Liver Function Tests: Recent Labs  Lab 02/08/18 0323  AST 13*  ALT 14*  ALKPHOS 95  BILITOT 0.6  PROT 6.0*  ALBUMIN 3.0*   BNP (last 3 results) Recent Labs    06/28/17 0610 01/28/18 0242 02/08/18 0323  BNP 38.0 54.0 54.2   CBG: Recent Labs  Lab 02/10/18 1201 02/10/18 1614 02/10/18 2140 02/11/18 0726 02/11/18 1033  GLUCAP 335* 246*  266* 312* 278*   Time spent: 35 minutes  Signed:  Berle Mull  [facility] 02/11/2018 , 6:52 PM

## 2018-02-14 ENCOUNTER — Other Ambulatory Visit: Payer: Self-pay

## 2018-02-14 ENCOUNTER — Telehealth: Payer: Self-pay | Admitting: Internal Medicine

## 2018-02-14 MED ORDER — SUCRALFATE 1 G PO TABS: 1.0000 g | ORAL_TABLET | Freq: Three times a day (TID) | ORAL | 0 refills | Status: DC

## 2018-02-14 NOTE — Telephone Encounter (Signed)
Pt called and states the carafate suspension was over 200.00 and he cannot afford it. Do you want to send in the carafate pills and have him make a slurry? Please advise.

## 2018-02-14 NOTE — Telephone Encounter (Signed)
Yes, please.

## 2018-02-14 NOTE — Telephone Encounter (Signed)
New prescription sent to pharmacy for pt.

## 2018-02-20 ENCOUNTER — Telehealth: Payer: Self-pay | Admitting: Internal Medicine

## 2018-02-20 NOTE — Telephone Encounter (Signed)
Discussed with pt that he did not need antibiotics, he did not test positive for H pylori. Pt aware.

## 2018-02-21 ENCOUNTER — Telehealth: Payer: Self-pay | Admitting: Internal Medicine

## 2018-02-21 ENCOUNTER — Ambulatory Visit: Payer: Medicare HMO | Admitting: Gastroenterology

## 2018-02-21 ENCOUNTER — Encounter: Payer: Self-pay | Admitting: *Deleted

## 2018-02-21 NOTE — Telephone Encounter (Signed)
I did see the patient recently during a hospitalization He lives in [place] and has received care at [facility] He also has seen other providers in our group. Not sure his transportation issue, but it may be easier for him to get care at [facility] That said, if he wishes to follow-up here I do not have a problem with that

## 2018-02-21 NOTE — Telephone Encounter (Signed)
Patient calling stating he needs to speak with Dr.Pyrtle's nurse about ulcers he has. Looks like a phone note with Dr.Pyrtle yesterday in the system, but pt has ov today at [facility] with Dr.Anna and an extensive gi hx with many doctors. Pt states he is a Dr.Pyrtle pt. Please advise.

## 2018-02-21 NOTE — Telephone Encounter (Signed)
Pt calling wanting to know when he is going to see Dr. Hilarie Fredrickson. Pt has multiple questions as to what caused his ulcers, what foods he can eat, how the ulcers are treated, did stress cause the ulcers, etc. Pt scheduled to see Dr. Hilarie Fredrickson tomorrow at 2pm. Pt aware of appt and will call and cancel if he cannot keep this appt.

## 2018-02-21 NOTE — Telephone Encounter (Signed)
Dr. Hilarie Fredrickson is this your pt? I see notes from [place]. Please advise.

## 2018-02-22 ENCOUNTER — Telehealth: Payer: Self-pay | Admitting: Internal Medicine

## 2018-02-22 ENCOUNTER — Ambulatory Visit: Payer: Medicare HMO | Admitting: Internal Medicine

## 2018-02-26 ENCOUNTER — Telehealth: Payer: Self-pay | Admitting: Internal Medicine

## 2018-02-26 NOTE — Telephone Encounter (Signed)
Pt states his stomach is hurting again, he is weak, and his stools are black again. Discussed with pt that he needs to be evaluated in the ER to make sure his is not bleeding again. Pt verbalized understanding and states he will go to [facility] ER.

## 2018-02-27 ENCOUNTER — Inpatient Hospital Stay: Payer: Medicare HMO | Admitting: Adult Health

## 2018-04-04 ENCOUNTER — Other Ambulatory Visit: Payer: Self-pay | Admitting: Internal Medicine

## 2018-04-18 ENCOUNTER — Telehealth: Payer: Self-pay

## 2018-04-18 NOTE — Telephone Encounter (Signed)
Spoke with pt and let him know Dr. [name] is the physician that referred him/entered referral in epic for the cancer center. Pt will contact that office.

## 2018-08-08 ENCOUNTER — Telehealth: Payer: Self-pay | Admitting: Pulmonary Disease

## 2018-08-08 DIAGNOSIS — J439 Emphysema, unspecified: Secondary | ICD-10-CM

## 2018-08-08 NOTE — Telephone Encounter (Signed)
Dr. Lake Bells is it ok to send order for tubing. Pt hasn't been seen here in over a year. I will send in order for tubing and made an appointment for pt with BQ on 09/20/2018 at 11:15. Nothing further is needed.

## 2018-09-12 ENCOUNTER — Ambulatory Visit: Payer: Medicare HMO | Admitting: Podiatry

## 2018-09-12 IMAGING — DX DG CHEST 1V PORT
1 series · 1 of 1 positions shown · non-contrast
Comparison: 01/28/2018

CLINICAL DATA: Dyspnea and COPD.

EXAM:
PORTABLE CHEST 1 VIEW

[chest ap]
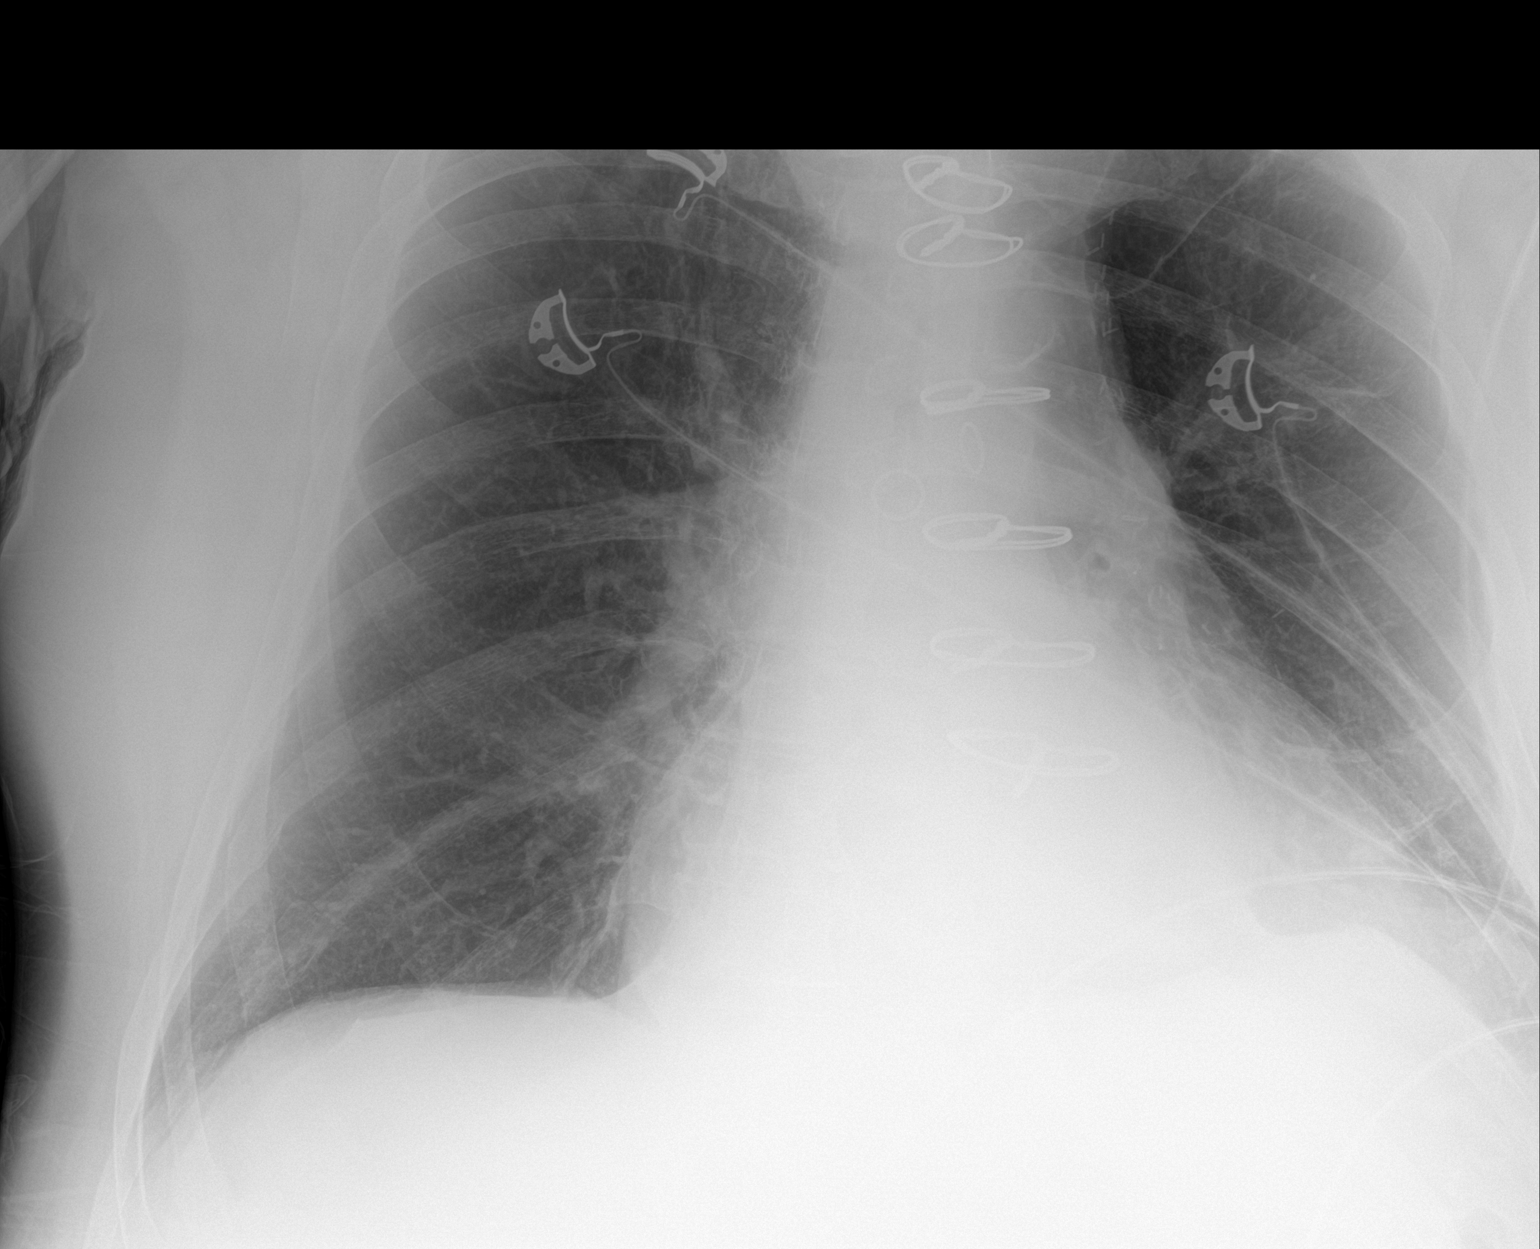

[1 of 1 positions shown; findings below may reference images not displayed]

FINDINGS: Stable cardiomegaly. Large hiatal hernia is superimposed on the
cardiac silhouette. The patient is status post CABG. There is aortic
atherosclerosis without aneurysmal dilatation. Pulmonary
hyperinflation consistent with COPD. No acute pulmonary
consolidation, CHF, effusion or pneumothorax. The left costophrenic
angle is excluded on this study.
IMPRESSION: Emphysematous hyperinflation of the lungs, similar to prior. Stable
cardiomegaly with large hiatal hernia. Aortic atherosclerosis
without aneurysm.

## 2018-09-20 ENCOUNTER — Ambulatory Visit: Payer: Medicare HMO | Admitting: Pulmonary Disease

## 2018-09-26 ENCOUNTER — Ambulatory Visit: Payer: Medicare HMO | Admitting: Podiatry

## 2018-10-02 ENCOUNTER — Other Ambulatory Visit (HOSPITAL_COMMUNITY): Payer: Medicare HMO

## 2018-10-02 ENCOUNTER — Observation Stay (HOSPITAL_COMMUNITY): Payer: Medicare HMO

## 2018-10-02 ENCOUNTER — Other Ambulatory Visit: Payer: Self-pay

## 2018-10-02 ENCOUNTER — Ambulatory Visit (HOSPITAL_BASED_OUTPATIENT_CLINIC_OR_DEPARTMENT_OTHER): Payer: Medicare HMO

## 2018-10-02 ENCOUNTER — Inpatient Hospital Stay (HOSPITAL_COMMUNITY)
Admission: EM | Admit: 2018-10-02 | Discharge: 2018-10-06 | DRG: 291 | Disposition: A | Discharge status: Home Health | Payer: Medicare HMO | Attending: Internal Medicine | Admitting: Internal Medicine

## 2018-10-02 ENCOUNTER — Emergency Department (HOSPITAL_COMMUNITY): Payer: Medicare HMO

## 2018-10-02 ENCOUNTER — Encounter (HOSPITAL_COMMUNITY): Payer: Self-pay | Admitting: Emergency Medicine

## 2018-10-02 DIAGNOSIS — E872 Acidosis: Secondary | ICD-10-CM | POA: Diagnosis present

## 2018-10-02 DIAGNOSIS — F3162 Bipolar disorder, current episode mixed, moderate: Secondary | ICD-10-CM | POA: Diagnosis present

## 2018-10-02 DIAGNOSIS — R609 Edema, unspecified: Secondary | ICD-10-CM

## 2018-10-02 DIAGNOSIS — K922 Gastrointestinal hemorrhage, unspecified: Secondary | ICD-10-CM | POA: Diagnosis present

## 2018-10-02 DIAGNOSIS — R0602 Shortness of breath: Secondary | ICD-10-CM

## 2018-10-02 DIAGNOSIS — Z888 Allergy status to other drugs, medicaments and biological substances status: Secondary | ICD-10-CM

## 2018-10-02 DIAGNOSIS — E785 Hyperlipidemia, unspecified: Secondary | ICD-10-CM | POA: Diagnosis present

## 2018-10-02 DIAGNOSIS — I5033 Acute on chronic diastolic (congestive) heart failure: Secondary | ICD-10-CM | POA: Diagnosis present

## 2018-10-02 DIAGNOSIS — I714 Abdominal aortic aneurysm, without rupture: Secondary | ICD-10-CM | POA: Diagnosis present

## 2018-10-02 DIAGNOSIS — T380X5A Adverse effect of glucocorticoids and synthetic analogues, initial encounter: Secondary | ICD-10-CM | POA: Diagnosis present

## 2018-10-02 DIAGNOSIS — Z885 Allergy status to narcotic agent status: Secondary | ICD-10-CM

## 2018-10-02 DIAGNOSIS — N179 Acute kidney failure, unspecified: Secondary | ICD-10-CM | POA: Diagnosis present

## 2018-10-02 DIAGNOSIS — I110 Hypertensive heart disease with heart failure: Secondary | ICD-10-CM | POA: Diagnosis present

## 2018-10-02 DIAGNOSIS — E1165 Type 2 diabetes mellitus with hyperglycemia: Secondary | ICD-10-CM | POA: Diagnosis present

## 2018-10-02 DIAGNOSIS — D72829 Elevated white blood cell count, unspecified: Secondary | ICD-10-CM | POA: Diagnosis present

## 2018-10-02 DIAGNOSIS — J9622 Acute and chronic respiratory failure with hypercapnia: Secondary | ICD-10-CM | POA: Diagnosis present

## 2018-10-02 DIAGNOSIS — Z880 Allergy status to penicillin: Secondary | ICD-10-CM

## 2018-10-02 DIAGNOSIS — I351 Nonrheumatic aortic (valve) insufficiency: Secondary | ICD-10-CM | POA: Diagnosis not present

## 2018-10-02 DIAGNOSIS — J9811 Atelectasis: Secondary | ICD-10-CM | POA: Diagnosis present

## 2018-10-02 DIAGNOSIS — J431 Panlobular emphysema: Secondary | ICD-10-CM

## 2018-10-02 DIAGNOSIS — Z23 Encounter for immunization: Secondary | ICD-10-CM | POA: Diagnosis present

## 2018-10-02 DIAGNOSIS — I252 Old myocardial infarction: Secondary | ICD-10-CM | POA: Diagnosis not present

## 2018-10-02 DIAGNOSIS — J9601 Acute respiratory failure with hypoxia: Secondary | ICD-10-CM

## 2018-10-02 DIAGNOSIS — I2510 Atherosclerotic heart disease of native coronary artery without angina pectoris: Secondary | ICD-10-CM | POA: Diagnosis present

## 2018-10-02 DIAGNOSIS — Z9981 Dependence on supplemental oxygen: Secondary | ICD-10-CM

## 2018-10-02 DIAGNOSIS — D500 Iron deficiency anemia secondary to blood loss (chronic): Secondary | ICD-10-CM | POA: Diagnosis present

## 2018-10-02 DIAGNOSIS — F603 Borderline personality disorder: Secondary | ICD-10-CM | POA: Diagnosis present

## 2018-10-02 DIAGNOSIS — I370 Nonrheumatic pulmonary valve stenosis: Secondary | ICD-10-CM | POA: Diagnosis not present

## 2018-10-02 DIAGNOSIS — M7989 Other specified soft tissue disorders: Secondary | ICD-10-CM | POA: Diagnosis not present

## 2018-10-02 DIAGNOSIS — F411 Generalized anxiety disorder: Secondary | ICD-10-CM | POA: Diagnosis present

## 2018-10-02 DIAGNOSIS — K219 Gastro-esophageal reflux disease without esophagitis: Secondary | ICD-10-CM | POA: Diagnosis present

## 2018-10-02 DIAGNOSIS — Z951 Presence of aortocoronary bypass graft: Secondary | ICD-10-CM

## 2018-10-02 DIAGNOSIS — J849 Interstitial pulmonary disease, unspecified: Secondary | ICD-10-CM | POA: Diagnosis present

## 2018-10-02 DIAGNOSIS — I361 Nonrheumatic tricuspid (valve) insufficiency: Secondary | ICD-10-CM | POA: Diagnosis not present

## 2018-10-02 DIAGNOSIS — Z79899 Other long term (current) drug therapy: Secondary | ICD-10-CM

## 2018-10-02 DIAGNOSIS — J9621 Acute and chronic respiratory failure with hypoxia: Secondary | ICD-10-CM | POA: Diagnosis present

## 2018-10-02 DIAGNOSIS — Z794 Long term (current) use of insulin: Secondary | ICD-10-CM

## 2018-10-02 DIAGNOSIS — Z8249 Family history of ischemic heart disease and other diseases of the circulatory system: Secondary | ICD-10-CM

## 2018-10-02 DIAGNOSIS — J449 Chronic obstructive pulmonary disease, unspecified: Secondary | ICD-10-CM | POA: Diagnosis present

## 2018-10-02 DIAGNOSIS — J9602 Acute respiratory failure with hypercapnia: Secondary | ICD-10-CM | POA: Diagnosis not present

## 2018-10-02 DIAGNOSIS — E118 Type 2 diabetes mellitus with unspecified complications: Secondary | ICD-10-CM

## 2018-10-02 DIAGNOSIS — J439 Emphysema, unspecified: Secondary | ICD-10-CM | POA: Diagnosis present

## 2018-10-02 DIAGNOSIS — J9600 Acute respiratory failure, unspecified whether with hypoxia or hypercapnia: Secondary | ICD-10-CM | POA: Diagnosis present

## 2018-10-02 DIAGNOSIS — Z825 Family history of asthma and other chronic lower respiratory diseases: Secondary | ICD-10-CM

## 2018-10-02 DIAGNOSIS — Z833 Family history of diabetes mellitus: Secondary | ICD-10-CM

## 2018-10-02 DIAGNOSIS — I10 Essential (primary) hypertension: Secondary | ICD-10-CM

## 2018-10-02 LAB — BRAIN NATRIURETIC PEPTIDE: B Natriuretic Peptide: 230 pg/mL — ABNORMAL HIGH (ref 0.0–100.0)

## 2018-10-02 LAB — INFLUENZA PANEL BY PCR (TYPE A & B)
Influenza A By PCR: NEGATIVE
Influenza B By PCR: NEGATIVE

## 2018-10-02 LAB — I-STAT ARTERIAL BLOOD GAS, ED
Acid-Base Excess: 6 mmol/L — ABNORMAL HIGH (ref 0.0–2.0)
Bicarbonate: 34.5 mmol/L — ABNORMAL HIGH (ref 20.0–28.0)
O2 SAT: 94 %
Patient temperature: 98.6
TCO2: 37 mmol/L — ABNORMAL HIGH (ref 22–32)
pCO2 arterial: 68.6 mmHg (ref 32.0–48.0)
pH, Arterial: 7.31 — ABNORMAL LOW (ref 7.350–7.450)
pO2, Arterial: 82 mmHg — ABNORMAL LOW (ref 83.0–108.0)

## 2018-10-02 LAB — CBG MONITORING, ED
Glucose-Capillary: 588 mg/dL (ref 70–99)
Glucose-Capillary: >600 mg/dL (ref 70–99)

## 2018-10-02 LAB — COMPREHENSIVE METABOLIC PANEL
ALT: 14 U/L (ref 0–44)
AST: 19 U/L (ref 15–41)
Albumin: 3.1 g/dL — ABNORMAL LOW (ref 3.5–5.0)
Alkaline Phosphatase: 121 U/L (ref 38–126)
Anion gap: 11 (ref 5–15)
BUN: 12 mg/dL (ref 8–23)
CO2: 32 mmol/L (ref 22–32)
Calcium: 8.5 mg/dL — ABNORMAL LOW (ref 8.9–10.3)
Chloride: 94 mmol/L — ABNORMAL LOW (ref 98–111)
Creatinine, Ser: 1.15 mg/dL (ref 0.61–1.24)
GFR calc Af Amer: >60 mL/min (ref >60)
GFR calc non Af Amer: >60 mL/min (ref >60)
Glucose, Bld: 500 mg/dL — ABNORMAL HIGH (ref 70–99)
POTASSIUM: 4.2 mmol/L (ref 3.5–5.1)
Sodium: 137 mmol/L (ref 135–145)
Total Bilirubin: 0.6 mg/dL (ref 0.3–1.2)
Total Protein: 6.7 g/dL (ref 6.5–8.1)

## 2018-10-02 LAB — I-STAT CHEM 8, ED
BUN: 14 mg/dL (ref 8–23)
Calcium, Ion: 1.03 mmol/L — ABNORMAL LOW (ref 1.15–1.40)
Chloride: 95 mmol/L — ABNORMAL LOW (ref 98–111)
Creatinine, Ser: 1 mg/dL (ref 0.61–1.24)
Glucose, Bld: 515 mg/dL (ref 70–99)
HCT: 33 % — ABNORMAL LOW (ref 39.0–52.0)
Hemoglobin: 11.2 g/dL — ABNORMAL LOW (ref 13.0–17.0)
Potassium: 4.2 mmol/L (ref 3.5–5.1)
Sodium: 136 mmol/L (ref 135–145)
TCO2: 35 mmol/L — ABNORMAL HIGH (ref 22–32)

## 2018-10-02 LAB — GLUCOSE, CAPILLARY
GLUCOSE-CAPILLARY: 246 mg/dL — AB (ref 70–99)
GLUCOSE-CAPILLARY: 245 mg/dL — AB (ref 70–99)
Glucose-Capillary: 496 mg/dL — ABNORMAL HIGH (ref 70–99)
Glucose-Capillary: 432 mg/dL — ABNORMAL HIGH (ref 70–99)
Glucose-Capillary: 379 mg/dL — ABNORMAL HIGH (ref 70–99)
Glucose-Capillary: 301 mg/dL — ABNORMAL HIGH (ref 70–99)
Glucose-Capillary: 276 mg/dL — ABNORMAL HIGH (ref 70–99)
Glucose-Capillary: 296 mg/dL — ABNORMAL HIGH (ref 70–99)

## 2018-10-02 LAB — CBC WITH DIFFERENTIAL/PLATELET
Abs Immature Granulocytes: 0.26 10*3/uL — ABNORMAL HIGH (ref 0.00–0.07)
BASOS ABS: 0 10*3/uL (ref 0.0–0.1)
Basophils Relative: 0 %
Eosinophils Absolute: 0.1 10*3/uL (ref 0.0–0.5)
Eosinophils Relative: 2 %
HCT: 35.7 % — ABNORMAL LOW (ref 39.0–52.0)
Hemoglobin: 10.2 g/dL — ABNORMAL LOW (ref 13.0–17.0)
IMMATURE GRANULOCYTES: 4 %
Lymphocytes Relative: 22 %
Lymphs Abs: 1.4 10*3/uL (ref 0.7–4.0)
MCH: 27.2 pg (ref 26.0–34.0)
MCHC: 28.6 g/dL — ABNORMAL LOW (ref 30.0–36.0)
MCV: 95.2 fL (ref 80.0–100.0)
Monocytes Absolute: 0.5 10*3/uL (ref 0.1–1.0)
Monocytes Relative: 7 %
NEUTROS ABS: 4.2 10*3/uL (ref 1.7–7.7)
Neutrophils Relative %: 65 %
Platelets: 173 10*3/uL (ref 150–400)
RBC: 3.75 MIL/uL — ABNORMAL LOW (ref 4.22–5.81)
RDW: 14.6 % (ref 11.5–15.5)
WBC: 6.5 10*3/uL (ref 4.0–10.5)
nRBC: 0 % (ref 0.0–0.2)

## 2018-10-02 LAB — I-STAT TROPONIN, ED: Troponin i, poc: 0.03 ng/mL (ref 0.00–0.08)

## 2018-10-02 LAB — I-STAT CG4 LACTIC ACID, ED: Lactic Acid, Venous: 2.68 mmol/L (ref 0.5–1.9)

## 2018-10-02 LAB — LACTIC ACID, PLASMA
Lactic Acid, Venous: 5.2 mmol/L (ref 0.5–1.9)
Lactic Acid, Venous: 3.5 mmol/L (ref 0.5–1.9)

## 2018-10-02 LAB — PROTIME-INR
INR: 1.01
Prothrombin Time: 13.2 seconds (ref 11.4–15.2)

## 2018-10-02 LAB — POC OCCULT BLOOD, ED: FECAL OCCULT BLD: NEGATIVE

## 2018-10-02 LAB — D-DIMER, QUANTITATIVE: D-Dimer, Quant: 3.9 ug/mL-FEU — ABNORMAL HIGH (ref 0.00–0.50)

## 2018-10-02 MED ORDER — INSULIN ASPART 100 UNIT/ML ~~LOC~~ SOLN: 0.0000 [IU] | Freq: Three times a day (TID) | SUBCUTANEOUS | Status: DC

## 2018-10-02 MED ORDER — MAGNESIUM SULFATE 2 GM/50ML IV SOLN
2.0000 g | Freq: Once | INTRAVENOUS | Status: AC
  Administered 2018-10-02: 2 g via INTRAVENOUS
  Filled 2018-10-02: qty 50

## 2018-10-02 MED ORDER — DEXTROSE-NACL 5-0.45 % IV SOLN: INTRAVENOUS | Status: DC

## 2018-10-02 MED ORDER — CITALOPRAM HYDROBROMIDE 20 MG PO TABS
20.0000 mg | ORAL_TABLET | Freq: Every day | ORAL | Status: DC
  Administered 2018-10-02 – 2018-10-06 (×5): 20 mg via ORAL
  Filled 2018-10-02 (×5): qty 1

## 2018-10-02 MED ORDER — CITALOPRAM HYDROBROMIDE 20 MG PO TABS: 20.0000 mg | ORAL_TABLET | Freq: Every day | ORAL | Status: DC

## 2018-10-02 MED ORDER — ATORVASTATIN CALCIUM 40 MG PO TABS
40.0000 mg | ORAL_TABLET | Freq: Every day | ORAL | Status: DC
  Administered 2018-10-02: 40 mg via ORAL
  Filled 2018-10-02: qty 1

## 2018-10-02 MED ORDER — ACETAMINOPHEN 325 MG PO TABS
650.0000 mg | ORAL_TABLET | ORAL | Status: DC | PRN
  Administered 2018-10-02 – 2018-10-06 (×7): 650 mg via ORAL
  Filled 2018-10-02 (×7): qty 2

## 2018-10-02 MED ORDER — SODIUM CHLORIDE 0.9% FLUSH: 3.0000 mL | INTRAVENOUS | Status: DC | PRN

## 2018-10-02 MED ORDER — IPRATROPIUM-ALBUTEROL 0.5-2.5 (3) MG/3ML IN SOLN
3.0000 mL | Freq: Once | RESPIRATORY_TRACT | Status: AC
  Administered 2018-10-02: 3 mL via RESPIRATORY_TRACT
  Filled 2018-10-02: qty 3

## 2018-10-02 MED ORDER — METOPROLOL TARTRATE 5 MG/5ML IV SOLN
10.0000 mg | Freq: Once | INTRAVENOUS | Status: AC
  Administered 2018-10-02: 10 mg via INTRAVENOUS
  Filled 2018-10-02: qty 10

## 2018-10-02 MED ORDER — SODIUM CHLORIDE 0.9 % IV BOLUS: 250.0000 mL | Freq: Once | INTRAVENOUS | Status: AC

## 2018-10-02 MED ORDER — INSULIN REGULAR BOLUS VIA INFUSION
0.0000 [IU] | Freq: Three times a day (TID) | INTRAVENOUS | Status: DC
  Administered 2018-10-03: 4.6 [IU] via INTRAVENOUS
  Filled 2018-10-02: qty 10

## 2018-10-02 MED ORDER — MOMETASONE FURO-FORMOTEROL FUM 200-5 MCG/ACT IN AERO
2.0000 | INHALATION_SPRAY | Freq: Two times a day (BID) | RESPIRATORY_TRACT | Status: DC
  Administered 2018-10-03: 2 via RESPIRATORY_TRACT
  Filled 2018-10-02 (×2): qty 8.8

## 2018-10-02 MED ORDER — ALBUTEROL SULFATE (2.5 MG/3ML) 0.083% IN NEBU: 2.5000 mg | INHALATION_SOLUTION | RESPIRATORY_TRACT | Status: DC | PRN

## 2018-10-02 MED ORDER — SODIUM CHLORIDE 0.9 % IV BOLUS
250.0000 mL | Freq: Once | INTRAVENOUS | Status: AC
  Administered 2018-10-02: 250 mL via INTRAVENOUS

## 2018-10-02 MED ORDER — SODIUM CHLORIDE 0.45 % IV SOLN
INTRAVENOUS | Status: DC
  Administered 2018-10-02: 21:00:00 via INTRAVENOUS

## 2018-10-02 MED ORDER — LEVALBUTEROL HCL 0.63 MG/3ML IN NEBU
0.6300 mg | INHALATION_SOLUTION | RESPIRATORY_TRACT | Status: DC | PRN
  Administered 2018-10-03 – 2018-10-04 (×3): 0.63 mg via RESPIRATORY_TRACT
  Filled 2018-10-02 (×3): qty 3

## 2018-10-02 MED ORDER — ALPRAZOLAM 0.25 MG PO TABS
0.2500 mg | ORAL_TABLET | Freq: Three times a day (TID) | ORAL | Status: DC | PRN
  Administered 2018-10-02 – 2018-10-03 (×4): 0.25 mg via ORAL
  Filled 2018-10-02 (×5): qty 1

## 2018-10-02 MED ORDER — ENOXAPARIN SODIUM 40 MG/0.4ML ~~LOC~~ SOLN
40.0000 mg | SUBCUTANEOUS | Status: DC
  Administered 2018-10-04 – 2018-10-05 (×2): 40 mg via SUBCUTANEOUS
  Filled 2018-10-02 (×3): qty 0.4

## 2018-10-02 MED ORDER — SODIUM CHLORIDE 0.9 % IV SOLN: 250.0000 mL | INTRAVENOUS | Status: DC | PRN

## 2018-10-02 MED ORDER — SUCRALFATE 1 G PO TABS
1.0000 g | ORAL_TABLET | Freq: Three times a day (TID) | ORAL | Status: DC
  Administered 2018-10-02 – 2018-10-06 (×16): 1 g via ORAL
  Filled 2018-10-02 (×16): qty 1

## 2018-10-02 MED ORDER — INFLUENZA VAC SPLIT HIGH-DOSE 0.5 ML IM SUSY
0.5000 mL | PREFILLED_SYRINGE | INTRAMUSCULAR | Status: AC
  Administered 2018-10-06: 0.5 mL via INTRAMUSCULAR
  Filled 2018-10-02: qty 0.5

## 2018-10-02 MED ORDER — PANTOPRAZOLE SODIUM 40 MG PO TBEC
40.0000 mg | DELAYED_RELEASE_TABLET | Freq: Every day | ORAL | Status: DC
  Administered 2018-10-02 – 2018-10-06 (×5): 40 mg via ORAL
  Filled 2018-10-02 (×5): qty 1

## 2018-10-02 MED ORDER — INSULIN ASPART 100 UNIT/ML ~~LOC~~ SOLN
10.0000 [IU] | Freq: Once | SUBCUTANEOUS | Status: AC
  Administered 2018-10-02: 10 [IU] via INTRAVENOUS

## 2018-10-02 MED ORDER — INSULIN REGULAR(HUMAN) IN NACL 100-0.9 UT/100ML-% IV SOLN
INTRAVENOUS | Status: DC
  Administered 2018-10-02: 5.3 [IU]/h via INTRAVENOUS
  Administered 2018-10-03: 4.5 [IU]/h via INTRAVENOUS
  Filled 2018-10-02 (×2): qty 100

## 2018-10-02 MED ORDER — INSULIN GLARGINE 100 UNIT/ML ~~LOC~~ SOLN
20.0000 [IU] | Freq: Two times a day (BID) | SUBCUTANEOUS | Status: DC
  Filled 2018-10-02: qty 0.2

## 2018-10-02 MED ORDER — DEXTROSE 50 % IV SOLN: 25.0000 mL | INTRAVENOUS | Status: DC | PRN

## 2018-10-02 MED ORDER — INSULIN ASPART 100 UNIT/ML ~~LOC~~ SOLN: 0.0000 [IU] | Freq: Every day | SUBCUTANEOUS | Status: DC

## 2018-10-02 MED ORDER — UMECLIDINIUM BROMIDE 62.5 MCG/INH IN AEPB
1.0000 | INHALATION_SPRAY | Freq: Every day | RESPIRATORY_TRACT | Status: DC
  Administered 2018-10-03 – 2018-10-06 (×4): 1 via RESPIRATORY_TRACT
  Filled 2018-10-02: qty 7

## 2018-10-02 MED ORDER — FUROSEMIDE 10 MG/ML IJ SOLN
40.0000 mg | Freq: Once | INTRAMUSCULAR | Status: AC
  Administered 2018-10-02: 40 mg via INTRAVENOUS
  Filled 2018-10-02: qty 4

## 2018-10-02 MED ORDER — GABAPENTIN 300 MG PO CAPS
300.0000 mg | ORAL_CAPSULE | Freq: Four times a day (QID) | ORAL | Status: DC
  Administered 2018-10-02 – 2018-10-06 (×15): 300 mg via ORAL
  Filled 2018-10-02 (×15): qty 1

## 2018-10-02 MED ORDER — ONDANSETRON HCL 4 MG/2ML IJ SOLN: 4.0000 mg | Freq: Four times a day (QID) | INTRAMUSCULAR | Status: DC | PRN

## 2018-10-02 MED ORDER — INSULIN GLARGINE 100 UNIT/ML ~~LOC~~ SOLN
10.0000 [IU] | Freq: Two times a day (BID) | SUBCUTANEOUS | Status: DC
  Administered 2018-10-02: 10 [IU] via SUBCUTANEOUS
  Filled 2018-10-02 (×2): qty 0.1

## 2018-10-02 MED ORDER — SODIUM CHLORIDE 0.9% FLUSH
3.0000 mL | Freq: Two times a day (BID) | INTRAVENOUS | Status: DC
  Administered 2018-10-02: 3 mL via INTRAVENOUS

## 2018-10-02 MED ORDER — PIPERACILLIN-TAZOBACTAM 3.375 G IVPB
3.3750 g | Freq: Three times a day (TID) | INTRAVENOUS | Status: DC
  Administered 2018-10-02 – 2018-10-04 (×5): 3.375 g via INTRAVENOUS
  Filled 2018-10-02 (×6): qty 50

## 2018-10-02 MED ORDER — FUROSEMIDE 10 MG/ML IJ SOLN
40.0000 mg | Freq: Two times a day (BID) | INTRAMUSCULAR | Status: DC
  Administered 2018-10-02: 40 mg via INTRAVENOUS
  Filled 2018-10-02: qty 4

## 2018-10-02 MED ORDER — INSULIN ASPART 100 UNIT/ML ~~LOC~~ SOLN
20.0000 [IU] | Freq: Once | SUBCUTANEOUS | Status: AC
  Administered 2018-10-02: 20 [IU] via SUBCUTANEOUS

## 2018-10-02 NOTE — Progress Notes (Addendum)
[facility] TEAM 1 - Stepdown/ICU TEAM  [name]  HUD:[id number] DOB: [birth year] DOA: 10/02/2018 PCP: [name], MD    Brief Narrative:  77 y.o. male w/ a hx of COPD with chronic hypoxic and hypercarbic respiratory failure, chronic diastolic CHF, coronary artery disease, insulin-dependent diabetes mellitus, chronic GI bleeding, and bipolar d/co w/ anxiety who presented to the ED w/ worsening shortness of breath and bilateral lower extremity swelling and orthopnea.    In the ED he was tachypneic and tachycardic in the 110s. CXR was notable for stable cardiomegaly and increasing interstitial prominence concerning for edema. Chemistry panel featured a glucose of 500 and CBC was notable for a chronic stable normocytic anemia with hemoglobin 10.2.  Significant Events: 12/17 admit   Subjective: Pt is seen for a f/u visit.    Assessment & Plan:  Acute on chronic hypoxic and hypercarbic respiratory failure  Now off BIPAP w/ stable sats and resp status - pt reporting cough now, especially w/ attempts to eat - add zosyn for possible aspiration - SLP to see - MRSA swab and add vanc for possible HCAP if MRSA screen +  Possible acute Diastolic CHF exac Was diuresed during initial 12-14 hours of admit - is presently 3L net negative   Sinus Tachycardia w/ lactic acidosis  Etiology unclear - clinically does not appear septic - has been diuresed due to pulm edema - now markedly tachycardic, but not in resp distress and w/o localizing sx to suggest an infection - for now will stop diuresis and give small volume IVF bolus - dose w/ BB IV x1 - follow lactate levels - TTE pending - check d-dimer   Insulin-dependent DM with severe hyperglycemia  No evidence of DKA - stop insulin gtt as soon as CBG controlled   COPD  reports using 7 lpm supplemental O2 at baseline - follow by [facility] at [facility]   Normocytic anemia - chronic GIB Hgb is stable at 10.2   Anxiety  Continue Celexa and  as-needed Xanax    CAD No SSCP  DVT prophylaxis: lovenox  Code Status: FULL CODE Family Communication:  Disposition Plan: SDU  Consultants:  none  Antimicrobials:  None    Objective: Blood pressure 114/74, pulse (!) 116, temperature 98.7 F (37.1 C), temperature source Temporal, resp. rate 19, height 6\' 3"  (1.905 m), weight 118.8 kg, SpO2 97 %.  Intake/Output Summary (Last 24 hours) at 10/02/2018 1901 Last data filed at 10/02/2018 1246 Gross per 24 hour  Intake 50 ml  Output 3455 ml  Net -3405 ml   Filed Weights   10/02/18 0123  Weight: 118.8 kg    Examination: Pt was seen for a f/u visit.    CBC: Recent Labs  Lab 10/02/18 0051 10/02/18 0108  WBC 6.5  --   NEUTROABS 4.2  --   HGB 10.2* 11.2*  HCT 35.7* 33.0*  MCV 95.2  --   PLT 173  --    Basic Metabolic Panel: Recent Labs  Lab 10/02/18 0051 10/02/18 0108  NA 137 136  K 4.2 4.2  CL 94* 95*  CO2 32  --   GLUCOSE 500* 515*  BUN 12 14  CREATININE 1.15 1.00  CALCIUM 8.5*  --    GFR: Estimated Creatinine Clearance: 87.3 mL/min (by C-G formula based on SCr of 1 mg/dL).  Liver Function Tests: Recent Labs  Lab 10/02/18 0051  AST 19  ALT 14  ALKPHOS 121  BILITOT 0.6  PROT 6.7  ALBUMIN 3.1*  Coagulation Profile: Recent Labs  Lab 10/02/18 0051  INR 1.01   HbA1C: Hgb A1c MFr Bld  Date/Time Value Ref Range Status  06/28/2017 02:19 PM 6.0 (H) 4.8 - 5.6 % Final    Comment:    (NOTE) Pre diabetes:          5.7%-6.4% Diabetes:              >6.4% Glycemic control for   <7.0% adults with diabetes   02/23/2017 08:27 AM 7.8 (H) 4.8 - 5.6 % Final    Comment:    (NOTE)         Pre-diabetes: 5.7 - 6.4         Diabetes: >6.4         Glycemic control for adults with diabetes: <7.0     CBG: Recent Labs  Lab 10/02/18 1202 10/02/18 1352 10/02/18 1558 10/02/18 1704 10/02/18 1809  GLUCAP >600* >600* 496* 432* 379*    Scheduled Meds: . atorvastatin  40 mg Oral q1800  . citalopram   20 mg Oral Daily  . enoxaparin (LOVENOX) injection  40 mg Subcutaneous Q24H  . furosemide  40 mg Intravenous Q12H  . gabapentin  300 mg Oral QID  . insulin regular  0-10 Units Intravenous TID WC  . metoprolol tartrate  10 mg Intravenous Once  . mometasone-formoterol  2 puff Inhalation BID  . pantoprazole  40 mg Oral Daily  . sodium chloride flush  3 mL Intravenous Q12H  . sucralfate  1 g Oral TID WC & HS  . umeclidinium bromide  1 puff Inhalation Daily     LOS: 0 days   Time spent: No Charge  Cherene Altes, MD [facility] Office  [phone] Pager - Text Page per Amion as per below:  On-Call/Text Page:      [email or web address]  If 7PM-7AM, please contact night-coverage [email or web address] 10/02/2018, 7:01 PM
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
## 2018-10-02 NOTE — Progress Notes (Signed)
Pt HR too high. Unable to perform echocardiogram at this time.

## 2018-10-02 NOTE — Progress Notes (Signed)
Right lower extremity venous duplex exam completed.  Please see preliminary notes on CV PROC under chart review. Sheera Illingworth H Ayleen Mckinstry(RDMS RVT) 10/02/18 11:36 AM

## 2018-10-02 NOTE — Progress Notes (Signed)
CRITICAL VALUE ALERT  Critical Value:  Lactic acid 5.2  Date & Time Notied:  10/02/18, 1800  Provider Notified: Sheral Flow, MD  Orders Received/Actions taken: no new orders at this time

## 2018-10-02 NOTE — ED Notes (Signed)
Condom cath placed on PT by this tech. Tech Phillinda as witness
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
## 2018-10-02 NOTE — ED Triage Notes (Signed)
[facility] reports a 71 yom called them for respiratory distress. EMS reports pt had decreased lung sounds and an elevated blood sugar. EMS reports their initial sugar was 574 and the pt self administered 11 units of novolog which brought his sugar down to 525. EMS reports 12 lead unremarkable, 22g IV in pt's left thumb, and decreased pain in his chest.   Pt reports SOB for 9 days now. Pt reports chest pain when he coughs and bends over. Pt reports no relief with his home medications and he reports he called 9-1-1 because things were getting worse.
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
## 2018-10-02 NOTE — ED Notes (Signed)
bfast tray ordered 

## 2018-10-03 ENCOUNTER — Inpatient Hospital Stay (HOSPITAL_COMMUNITY): Payer: Medicare HMO

## 2018-10-03 DIAGNOSIS — I370 Nonrheumatic pulmonary valve stenosis: Secondary | ICD-10-CM

## 2018-10-03 DIAGNOSIS — I361 Nonrheumatic tricuspid (valve) insufficiency: Secondary | ICD-10-CM

## 2018-10-03 DIAGNOSIS — I351 Nonrheumatic aortic (valve) insufficiency: Secondary | ICD-10-CM

## 2018-10-03 LAB — COMPREHENSIVE METABOLIC PANEL
ALT: 15 U/L (ref 0–44)
AST: 26 U/L (ref 15–41)
Albumin: 3.5 g/dL (ref 3.5–5.0)
Alkaline Phosphatase: 108 U/L (ref 38–126)
Anion gap: 15 (ref 5–15)
BUN: 27 mg/dL — ABNORMAL HIGH (ref 8–23)
CALCIUM: 9.3 mg/dL (ref 8.9–10.3)
CO2: 33 mmol/L — ABNORMAL HIGH (ref 22–32)
Chloride: 92 mmol/L — ABNORMAL LOW (ref 98–111)
Creatinine, Ser: 1.48 mg/dL — ABNORMAL HIGH (ref 0.61–1.24)
GFR calc non Af Amer: 45 mL/min — ABNORMAL LOW (ref >60)
GFR, EST AFRICAN AMERICAN: 53 mL/min — AB (ref >60)
Glucose, Bld: 110 mg/dL — ABNORMAL HIGH (ref 70–99)
Potassium: 4.5 mmol/L (ref 3.5–5.1)
Sodium: 140 mmol/L (ref 135–145)
Total Bilirubin: 0.5 mg/dL (ref 0.3–1.2)
Total Protein: 7.3 g/dL (ref 6.5–8.1)

## 2018-10-03 LAB — GLUCOSE, CAPILLARY
GLUCOSE-CAPILLARY: 199 mg/dL — AB (ref 70–99)
Glucose-Capillary: 132 mg/dL — ABNORMAL HIGH (ref 70–99)
Glucose-Capillary: 148 mg/dL — ABNORMAL HIGH (ref 70–99)
Glucose-Capillary: 217 mg/dL — ABNORMAL HIGH (ref 70–99)
Glucose-Capillary: 208 mg/dL — ABNORMAL HIGH (ref 70–99)
Glucose-Capillary: 209 mg/dL — ABNORMAL HIGH (ref 70–99)
Glucose-Capillary: 212 mg/dL — ABNORMAL HIGH (ref 70–99)
Glucose-Capillary: 167 mg/dL — ABNORMAL HIGH (ref 70–99)
Glucose-Capillary: 114 mg/dL — ABNORMAL HIGH (ref 70–99)
Glucose-Capillary: 117 mg/dL — ABNORMAL HIGH (ref 70–99)
Glucose-Capillary: 396 mg/dL — ABNORMAL HIGH (ref 70–99)
Glucose-Capillary: 378 mg/dL — ABNORMAL HIGH (ref 70–99)

## 2018-10-03 LAB — CBC
HCT: 36.7 % — ABNORMAL LOW (ref 39.0–52.0)
Hemoglobin: 10.9 g/dL — ABNORMAL LOW (ref 13.0–17.0)
MCH: 27.1 pg (ref 26.0–34.0)
MCHC: 29.7 g/dL — ABNORMAL LOW (ref 30.0–36.0)
MCV: 91.3 fL (ref 80.0–100.0)
Platelets: 238 10*3/uL (ref 150–400)
RBC: 4.02 MIL/uL — ABNORMAL LOW (ref 4.22–5.81)
RDW: 14.5 % (ref 11.5–15.5)
WBC: 16.8 10*3/uL — ABNORMAL HIGH (ref 4.0–10.5)
nRBC: 0 % (ref 0.0–0.2)

## 2018-10-03 LAB — ECHOCARDIOGRAM COMPLETE
Height: 75 in
Weight: 4192 oz

## 2018-10-03 LAB — MAGNESIUM: Magnesium: 2.2 mg/dL (ref 1.7–2.4)

## 2018-10-03 LAB — LACTIC ACID, PLASMA: Lactic Acid, Venous: 3 mmol/L (ref 0.5–1.9)

## 2018-10-03 LAB — MRSA PCR SCREENING: MRSA by PCR: NEGATIVE

## 2018-10-03 MED ORDER — INSULIN GLARGINE 100 UNIT/ML ~~LOC~~ SOLN
12.0000 [IU] | Freq: Two times a day (BID) | SUBCUTANEOUS | Status: DC
  Administered 2018-10-03: 12 [IU] via SUBCUTANEOUS
  Filled 2018-10-03 (×2): qty 0.12

## 2018-10-03 MED ORDER — TECHNETIUM TO 99M ALBUMIN AGGREGATED
4.0000 | Freq: Once | INTRAVENOUS | Status: AC | PRN
  Administered 2018-10-03: 4 via INTRAVENOUS

## 2018-10-03 MED ORDER — INSULIN GLARGINE 100 UNIT/ML ~~LOC~~ SOLN
10.0000 [IU] | Freq: Two times a day (BID) | SUBCUTANEOUS | Status: DC
  Filled 2018-10-03: qty 0.1

## 2018-10-03 MED ORDER — DEXTROSE-NACL 5-0.45 % IV SOLN
INTRAVENOUS | Status: DC
  Administered 2018-10-03: 01:00:00 via INTRAVENOUS

## 2018-10-03 MED ORDER — FLUTICASONE FUROATE-VILANTEROL 200-25 MCG/INH IN AEPB: 1.0000 | INHALATION_SPRAY | Freq: Every day | RESPIRATORY_TRACT | Status: DC

## 2018-10-03 MED ORDER — PERFLUTREN LIPID MICROSPHERE
1.0000 mL | INTRAVENOUS | Status: AC | PRN
  Administered 2018-10-03: 2 mL via INTRAVENOUS
  Filled 2018-10-03: qty 10

## 2018-10-03 MED ORDER — INSULIN ASPART 100 UNIT/ML ~~LOC~~ SOLN
5.0000 [IU] | Freq: Three times a day (TID) | SUBCUTANEOUS | Status: DC
  Administered 2018-10-03: 5 [IU] via SUBCUTANEOUS

## 2018-10-03 MED ORDER — TECHNETIUM TC 99M DIETHYLENETRIAME-PENTAACETIC ACID
30.0000 | Freq: Once | INTRAVENOUS | Status: AC | PRN
  Administered 2018-10-03: 30 via RESPIRATORY_TRACT

## 2018-10-03 MED ORDER — ALPRAZOLAM 0.25 MG PO TABS
0.2500 mg | ORAL_TABLET | Freq: Four times a day (QID) | ORAL | Status: DC | PRN
  Administered 2018-10-03 – 2018-10-05 (×6): 0.25 mg via ORAL
  Filled 2018-10-03 (×6): qty 1

## 2018-10-03 MED ORDER — INSULIN ASPART 100 UNIT/ML ~~LOC~~ SOLN
0.0000 [IU] | Freq: Three times a day (TID) | SUBCUTANEOUS | Status: DC
  Administered 2018-10-03: 15 [IU] via SUBCUTANEOUS
  Administered 2018-10-04: 5 [IU] via SUBCUTANEOUS
  Administered 2018-10-04: 8 [IU] via SUBCUTANEOUS
  Administered 2018-10-04: 11 [IU] via SUBCUTANEOUS
  Administered 2018-10-05: 3 [IU] via SUBCUTANEOUS
  Administered 2018-10-05: 8 [IU] via SUBCUTANEOUS
  Administered 2018-10-05 – 2018-10-06 (×2): 5 [IU] via SUBCUTANEOUS
  Administered 2018-10-06: 11 [IU] via SUBCUTANEOUS

## 2018-10-03 MED ORDER — INSULIN GLARGINE 100 UNIT/ML ~~LOC~~ SOLN
15.0000 [IU] | Freq: Two times a day (BID) | SUBCUTANEOUS | Status: DC
  Administered 2018-10-03: 15 [IU] via SUBCUTANEOUS
  Filled 2018-10-03 (×3): qty 0.15

## 2018-10-03 MED ORDER — IOPAMIDOL (ISOVUE-370) INJECTION 76%
INTRAVENOUS | Status: AC
  Administered 2018-10-03: 100 mL
  Filled 2018-10-03: qty 100

## 2018-10-03 MED ORDER — MOMETASONE FURO-FORMOTEROL FUM 200-5 MCG/ACT IN AERO
2.0000 | INHALATION_SPRAY | Freq: Two times a day (BID) | RESPIRATORY_TRACT | Status: DC
  Administered 2018-10-03 – 2018-10-06 (×6): 2 via RESPIRATORY_TRACT
  Filled 2018-10-03: qty 8.8

## 2018-10-03 MED ORDER — INSULIN ASPART 100 UNIT/ML ~~LOC~~ SOLN
0.0000 [IU] | Freq: Every day | SUBCUTANEOUS | Status: DC
  Administered 2018-10-03: 5 [IU] via SUBCUTANEOUS
  Administered 2018-10-04: 3 [IU] via SUBCUTANEOUS
  Administered 2018-10-05: 2 [IU] via SUBCUTANEOUS

## 2018-10-03 MED ORDER — LIVING WELL WITH DIABETES BOOK
Freq: Once | Status: AC
  Administered 2018-10-04: 10:00:00
  Filled 2018-10-03: qty 1

## 2018-10-03 NOTE — Progress Notes (Signed)
0215: Schorr was notified of patients blood sugar being within range. She was notified that patient is on D5 1/2NS. Order placed for labs.   5929: Patient pulled out both IV's.   2446: Patient was educated about diabetes and how is blood sugar can be controlled.  He stated this is all new information for him.  He also stated that his wife checks his blood sugar for him a couple times a week because he cannot see very well.  Patient educated on importance of glucose control.

## 2018-10-03 NOTE — Progress Notes (Signed)
Kc Spoke to me on the phone about CT scan. I spoke with CT and we need a 20G IV for test. IV team consulted. Educated patient on CT scan.  Patient states he understands and that the physician spoke with him about possible CT earlier today.

## 2018-10-03 NOTE — Progress Notes (Signed)
  Echocardiogram 2D Echocardiogram has been performed.  [name] 10/03/2018, 10:43 AM

## 2018-10-03 NOTE — Progress Notes (Signed)
Marland Kitchen  PROGRESS NOTE    [name]  VZC:[id number] DOB: 03/11/1941 DOA: 10/02/2018 PCP: [name], MD   Brief Narrative: 77 year old male with history of COPD, chronic hypoxic and hypercapnic respiratory failure, chronic diastolic CHF, CAD insulin-dependent diabetes, chronic GI bleeding, anxiety presented to the ER 12/17 with worsening shortness of breath for more than a week with bilateral lower extremity swelling, orthopnea, worsening of chronic cough.  In the ER patient had markedly hyperglycemic, BNP mildly up at 230, chest x-ray showed "Stable cardiomegaly. Increasing interstitial prominence most consistent with pulmonary edema. Small LEFT pleural effusion.RIGHT lung base atelectasis/scarring,Emphysema" . Patient was initially diuresed Lasix currently on hold. was given Solu-Medrol and nebs and was admitted.  Assessment & Plan:   Acute on chronic hypoxic and hypercapnic respiratory failure : Patient needed BiPAP initially.  Currently on 4 L nasal cannula.  Patient reports he is normally on7 L and follows up with the pulmonary. Currently respiratory status is stable. suspect  Multi-factorial suspecting aspiration?,  Copd exacerbation. Echocardiogram no significant abnormalities, normal lvef and diastolic fun.  Continue on bronchodilator therapy.  Holding off on a steroid due to uncontrolled hyperglycemia as respiratory status is stable currently.  Patient did not get his [facility] 12/17, spoke with RT, was not available yesterday can switch to breo.  Leukocytosis: Patient did get a steroid on admission which could contribute to his leukocytosis. He is afebrile.  He is on Zosyn and will be continued. Monitor.  Influenza screen was negative.  Blood cultures were sent and in process.  Possible aspiration PNEUMONIA: Continue on Zosyn for now. he is afebrile.  Elevated d-dimer, patient had shortness of breath and pleuritic chest pain on admission.  Duplex of the lower leg was negative.  We will  go ahead and order VQ scan if it is inconclusive will obtain CTA to rule out PE, explained risks benefits including contrast nephropathy to the patient and he is in agreement.  Echo reviewed in the afternoon and no significant abnormalities.   Uncontrolled hyperglycemia around 500 on admission, has been on Insulin drip 3.5 units/h blood sugar in 100s this am and also on dextrose ivf, his anion gap 11-15.  Patient is tolerating p.o. we will switch to  sqLantus, sq insulin , monitor labs/accucheck.  Mild AKI: creat at 1.4 from 1.0. monitor. cont gentle iv fluids.  Monitor chemistry panel.  Lactic acidosis suspect this is multifactorial secondary respiratory distress, uncontrolled hyperglycemia: Lactate on admission 5.2 down trended to 3.0. likely contributing to his mild gap.  Normocytic anemia with history of chronic GI bleeding, hemoglobin is stable in 10 to 11 g.  Monitor.  FOBT was negative.  Anxiety disorder: Continue his Celexa and as needed Xanax.  JOI:NOMVEH  DVT prophylaxis: Lovenox [place] Code Status: Full code Family Communication: No family at the bedside Disposition Plan: SDU.  Obtain physical therapy evaluation and ambulate as tolerated.  Consultants: NONE  Procedures: TTE "Left ventricle: The cavity size was normal. Wall thickness was   increased in a pattern of moderate LVH. Systolic function was   normal. The estimated ejection fraction was in the range of 60%   to 65%. Left ventricular diastolic function parameters were   normal. - Aortic valve: There was very mild stenosis. There was mild   regurgitation. Valve area (VTI): 2.16 cm^2. Valve area (Vmax):   2.54 cm^2. Valve area (Vmean): 2.25 cm"  Antimicrobials: Zosyn   Subjective: Seen and examined.  He feels much improved today.  Heart rate is in  90s.  He is on 4 L nasal cannula. Patient tachycardia improved heart rate in 80s to 90s, tachypnea better. Overnight WBC BC bumped to 16,800 from 6500 but no fever. Sugar  on admission was 515 and now improved in 100. On 4 L nasal cannula saturating at 94% Labs from yesterday showed positive d-dimer 3.9- Duplex of the legs were negative yesterday. BNP was 230 Creatinine  at 1.4 from 1.0 On Dulera from home but not getting yesterday-got this am..  Objective: Vitals:   10/03/18 0800 10/03/18 0913 10/03/18 0939 10/03/18 1205  BP: (!) 159/98   131/77  Pulse: 94   100  Resp: (!) 23   20  Temp:    97.6 F (36.4 C)  TempSrc:    Oral  SpO2: 93% 96% 94% 96%  Weight:      Height:        Intake/Output Summary (Last 24 hours) at 10/03/2018 1601 Last data filed at 10/03/2018 1358 Gross per 24 hour  Intake 896.62 ml  Output 2000 ml  Net -1103.38 ml   Filed Weights   10/02/18 0123  Weight: 118.8 kg   Weight change:   Body mass index is 32.75 kg/m.  Examination:  General exam: Appears calm and comfortable,, anxious.Not in distress. HEENT:PERRL,Oral mucosa moist, Ear/Nose normal on gross exam Respiratory system: Bilateral diminished breath sounds with prolonged  expiratory phase with mild wheezing, no crackles  Cardiovascular system: S1 & S2 heard,No JVD, murmurs.No pedal edema. Gastrointestinal system: Abdomen is  soft, obese non tender, non distended, BS +  Nervous System:Alert and oriented. No focal neurological deficits/moving extremities. Extremities: Bilateral lower leg pitting edema right more than left, no clubbing,distal peripheral pulses palpable. Skin: No rashes, lesions,no icterus MSK: Normal muscle bulk,tone ,power  Data Reviewed: I have personally reviewed following labs and imaging studies  CBC: Recent Labs  Lab 10/02/18 0051 10/02/18 0108 10/03/18 0119  WBC 6.5  --  16.8*  NEUTROABS 4.2  --   --   HGB 10.2* 11.2* 10.9*  HCT 35.7* 33.0* 36.7*  MCV 95.2  --  91.3  PLT 173  --  350   Basic Metabolic Panel: Recent Labs  Lab 10/02/18 0051 10/02/18 0108 10/03/18 0119  NA 137 136 140  K 4.2 4.2 4.5  CL 94* 95* 92*  CO2 32   --  33*  GLUCOSE 500* 515* 110*  BUN 12 14 27*  CREATININE 1.15 1.00 1.48*  CALCIUM 8.5*  --  9.3  MG  --   --  2.2   GFR: Estimated Creatinine Clearance: 59 mL/min (A) (by C-G formula based on SCr of 1.48 mg/dL (H)). Liver Function Tests: Recent Labs  Lab 10/02/18 0051 10/03/18 0119  AST 19 26  ALT 14 15  ALKPHOS 121 108  BILITOT 0.6 0.5  PROT 6.7 7.3  ALBUMIN 3.1* 3.5   No results for input(s): LIPASE, AMYLASE in the last 168 hours. No results for input(s): AMMONIA in the last 168 hours. Coagulation Profile: Recent Labs  Lab 10/02/18 0051  INR 1.01   Cardiac Enzymes: No results for input(s): CKTOTAL, CKMB, CKMBINDEX, TROPONINI in the last 168 hours. BNP (last 3 results) No results for input(s): PROBNP in the last 8760 hours. HbA1C: No results for input(s): HGBA1C in the last 72 hours. CBG: Recent Labs  Lab 10/03/18 0457 10/03/18 0603 10/03/18 0704 10/03/18 0757 10/03/18 1137  GLUCAP 209* 212* 167* 114* 117*   Lipid Profile: No results for input(s): CHOL, HDL, LDLCALC, TRIG, CHOLHDL, LDLDIRECT  in the last 72 hours. Thyroid Function Tests: No results for input(s): TSH, T4TOTAL, FREET4, T3FREE, THYROIDAB in the last 72 hours. Anemia Panel: No results for input(s): VITAMINB12, FOLATE, FERRITIN, TIBC, IRON, RETICCTPCT in the last 72 hours. Sepsis Labs: Recent Labs  Lab 10/02/18 0109 10/02/18 1704 10/02/18 2130 10/03/18 0119  LATICACIDVEN 2.68* 5.2* 3.5* 3.0*    Recent Results (from the past 240 hour(s))  MRSA PCR Screening     Status: None   Collection Time: 10/02/18  8:12 PM  Result Value Ref Range Status   MRSA by PCR NEGATIVE NEGATIVE Final    Comment:        The GeneXpert MRSA Assay (FDA approved for NASAL specimens only), is one component of a comprehensive MRSA colonization surveillance program. It is not intended to diagnose MRSA infection nor to guide or monitor treatment for MRSA infections. Performed at [facility], [place] [address] [place], [place] [address]   Culture, blood (routine x 2) Call MD if unable to obtain prior to antibiotics being given     Status: None (Preliminary result)   Collection Time: 10/02/18 10:00 PM  Result Value Ref Range Status   Specimen Description BLOOD RIGHT HAND  Final   Special Requests   Final    BOTTLES DRAWN AEROBIC ONLY Blood Culture adequate volume   Culture   Final    NO GROWTH < 24 HOURS Performed at [facility], [address] [place], [place] [address]    Report Status PENDING  Incomplete  Culture, blood (routine x 2) Call MD if unable to obtain prior to antibiotics being given     Status: None (Preliminary result)   Collection Time: 10/02/18 10:10 PM  Result Value Ref Range Status   Specimen Description BLOOD RIGHT FINGER  Final   Special Requests   Final    BOTTLES DRAWN AEROBIC ONLY Blood Culture adequate volume   Culture   Final    NO GROWTH < 24 HOURS Performed at [facility], [address], [place], [place] [address]    Report Status PENDING  Incomplete      Radiology Studies: Nm Pulmonary Perf And Vent  Result Date: 10/03/2018 CLINICAL DATA:  Elevated D-dimer question pulmonary embolism, worsening shortness of breath, known history of CHF, COPD, essential hypertension, type II diabetes mellitus EXAM: NUCLEAR MEDICINE VENTILATION - PERFUSION LUNG SCAN TECHNIQUE: Ventilation images were obtained in multiple projections using inhaled aerosol Tc-45m DTPA. Perfusion images were obtained in multiple projections after intravenous injection of Tc-32m MAA. RADIOPHARMACEUTICALS:  30 mCi of Tc-13m DTPA aerosol inhalation and 4 mCi Tc7m MAA IV COMPARISON:  None Correlation: Chest radiograph 10/02/2018 FINDINGS: Ventilation: Central airway deposition of aerosol. Diminished ventilation in LEFT lower lobe. Enlargement of cardiac silhouette. Perfusion: Diminished perfusion in LEFT lower lobe though matching the ventilation defect. Single small subsegmental  perfusion defect at superior segment RIGHT lower lobe. No additional perfusion defects identified. Enlargement of cardiac silhouette. Chest radiograph: Enlargement of cardiac silhouette post CABG with bibasilar atelectasis greater on LEFT. IMPRESSION: Cardiomegaly. Diminished ventilation and perfusion in LEFT lower lobe with matching radiographic opacity in the LEFT lower lobe on chest radiograph of 10/02/2018. Large triple matched lower lobe abnormalities represent an intermediate probability for pulmonary embolism. Electronically Signed   By: Lavonia Dana M.D.   On: 10/03/2018 14:12   Dg Chest Portable 1 View  Result Date: 10/02/2018 CLINICAL DATA:  Shortness of breath today. History of cardiac surgery, hypertension. EXAM: PORTABLE CHEST  1 VIEW COMPARISON:  Chest radiograph February 08, 2018 FINDINGS: Stable cardiomegaly. Status post median sternotomy for CABG. Calcified aortic arch. Pulmonary vascular congestion and increased interstitial prominence. RIGHT lung base bandlike density. LEFT lung base out of field of view, small LEFT pleural effusion. Biapical bullous changes. No pneumothorax. Osseous structures are non suspicious. IMPRESSION: 1. Stable cardiomegaly. Increasing interstitial prominence most consistent with pulmonary edema. Small LEFT pleural effusion. 2. RIGHT lung base atelectasis/scarring. 3.  Emphysema (ICD10-J43.9).  Aortic Atherosclerosis (ICD10-I70.0). Electronically Signed   By: Elon Alas M.D.   On: 10/02/2018 01:42   Vas [place] Lower Extremity Venous (dvt)  Result Date: 10/02/2018  Lower Venous Study Indications: Swelling, Edema, hypoxia, and SOB. Other Indications: CHF, Chronic GI bleeding. Limitations: Body habitus. Performing Technologist: Rudell Cobb  Examination Guidelines: A complete evaluation includes B-mode imaging, spectral Doppler, color Doppler, and power Doppler as needed of all accessible portions of each vessel. Bilateral testing is considered an integral part of a  complete examination. Limited examinations for reoccurring indications may be performed as noted.  Right Venous Findings: +---------+---------------+---------+-----------+----------+-------------------+          CompressibilityPhasicitySpontaneityPropertiesSummary             +---------+---------------+---------+-----------+----------+-------------------+ CFV      Full           Yes      Yes                                      +---------+---------------+---------+-----------+----------+-------------------+ SFJ      Full                                                             +---------+---------------+---------+-----------+----------+-------------------+ FV Prox  Full                                                             +---------+---------------+---------+-----------+----------+-------------------+ FV Mid   Full                                                             +---------+---------------+---------+-----------+----------+-------------------+ FV DistalFull                                                             +---------+---------------+---------+-----------+----------+-------------------+ PFV      Full                                                             +---------+---------------+---------+-----------+----------+-------------------+  POP      Full           Yes      Yes                                      +---------+---------------+---------+-----------+----------+-------------------+ PTV                                                   not well visualized +---------+---------------+---------+-----------+----------+-------------------+ PERO                                                  not well visualized +---------+---------------+---------+-----------+----------+-------------------+  Left Venous Findings: +---+---------------+---------+-----------+----------+-------+     CompressibilityPhasicitySpontaneityPropertiesSummary +---+---------------+---------+-----------+----------+-------+ CFVFull           Yes      Yes                          +---+---------------+---------+-----------+----------+-------+    Summary: Right: There is no evidence of deep vein thrombosis in the lower extremity. However, portions of this examination were limited- see technologist comments above. No cystic structure found in the popliteal fossa. Left: No evidence of common femoral vein obstruction.  *See table(s) above for measurements and observations. Electronically signed by Deitra Mayo MD on 10/02/2018 at 4:35:58 PM.    Final      Scheduled Meds: . citalopram  20 mg Oral Daily  . enoxaparin (LOVENOX) injection  40 mg Subcutaneous Q24H  . gabapentin  300 mg Oral QID  . Influenza vac split quadrivalent PF  0.5 mL Intramuscular Tomorrow-1000  . insulin aspart  0-15 Units Subcutaneous TID WC  . insulin aspart  0-5 Units Subcutaneous QHS  . insulin aspart  5 Units Subcutaneous TID WC  . insulin glargine  12 Units Subcutaneous BID  . mometasone-formoterol  2 puff Inhalation BID  . pantoprazole  40 mg Oral Daily  . sucralfate  1 g Oral TID WC & HS  . umeclidinium bromide  1 puff Inhalation Daily   Continuous Infusions: . sodium chloride 75 mL/hr at 10/02/18 2030  . piperacillin-tazobactam (ZOSYN)  IV 3.375 g (10/03/18 1419)     LOS: 1 day   Time spent: More than 50% of that time was spent in counseling and/or coordination of care.  Antonieta Pert, MD [facility] Pager [phone]  If 7PM-7AM, please contact night-coverage [email or web address] Password [email or web address] 10/03/2018, 4:01 PM

## 2018-10-03 NOTE — Care Management Note (Signed)
Case Management Note  Patient Details  Name: [name] MRN: [id number] Date of Birth: Feb 27, 1941  Subjective/Objective:     Pt admitted with   resp failure - on home oxygen             Action/Plan:   PTA Independent from home with wife.  Pt is on 4 liters continuous oxygen at home.    Expected Discharge Date:                  Expected Discharge Plan:     In-House Referral:     Discharge planning Services  CM Consult  Post Acute Care Choice:    Choice offered to:     DME Arranged:    DME Agency:     HH Arranged:    HH Agency:     Status of Service:  In process, will continue to follow  If discussed at Long Length of Stay Meetings, dates discussed:    Additional Comments:  [name], RN 10/03/2018, 3:58 PM

## 2018-10-03 NOTE — Progress Notes (Signed)
Inpatient Diabetes Program Recommendations  AACE/ADA: New Consensus Statement on Inpatient Glycemic Control (2015)  Target Ranges:  Prepandial:   less than 140 mg/dL      Peak postprandial:   less than 180 mg/dL (1-2 hours)      Critically ill patients:  140 - 180 mg/dL   Lab Results  Component Value Date   GLUCAP 396 (H) 10/03/2018   HGBA1C 6.0 (H) 06/28/2017   DM2  Home DM meds: Lantus 20 units BID                              Novolog 11 units tid prn "high blood sugar" with meals  Current DM meds: Lantus 10 units BID (new dose to start tonight)                                 Novolog moderate correction (0-15 units) tid ac and (0-5 units) hs   Saw note from night shift RN stating patient said diabetes information is "all new to him". Diabetic coordinator visited patient and discussed diabetes management at home. He stated he can not see well enough to check his own CBG and his wife does it a few times a week. 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 He states he is able to give his insulin injections himself without difficulty. Patient states he recently switched PCP from Dr. Mancel Bale to the [facility] here in [place]. In regards to insulin, patient says he is able to obtain his prescribed insulin without problem. It was unclear the exact Novolog scale that is used when he eats at home. Encouraged patient if possible to have his wife check cbg once a day and record cbg value to take back when he sees his PCP. Explained to him that it would be very helpful to his MD to see glucose trends at home and if insulin adjustments need to be made.   Talked with patient about his blood glucose level being very high at 588 on admission to the ED. He mentioned many dietary changes he has made at home. Is interested in meeting with dietician here - consult placed. I offered possibility of outpatient diabetes education but he declined stating he would not have  transportation to attend. He said he would be receptive to the "Living Well with Diabetes" book - has been ordered.  Wife was not present during visit so unable to ask her perspective on his diabetes care.  MD last Hgb A1c was drawn 06/2017 and was 6 %. Please order if you would like an updated one drawn.   Thank you.  -- Will follow during hospitalization.--  Jonna Clark RN, MSN Diabetes Coordinator Inpatient Glycemic Control Team Team Pager: [phone] (8am-5pm)

## 2018-10-04 ENCOUNTER — Ambulatory Visit: Payer: Medicare HMO | Admitting: Pulmonary Disease

## 2018-10-04 LAB — BASIC METABOLIC PANEL
Anion gap: 9 (ref 5–15)
BUN: 29 mg/dL — ABNORMAL HIGH (ref 8–23)
CALCIUM: 8.3 mg/dL — AB (ref 8.9–10.3)
CO2: 33 mmol/L — AB (ref 22–32)
Chloride: 95 mmol/L — ABNORMAL LOW (ref 98–111)
Creatinine, Ser: 1.4 mg/dL — ABNORMAL HIGH (ref 0.61–1.24)
GFR calc Af Amer: 56 mL/min — ABNORMAL LOW (ref >60)
GFR calc non Af Amer: 48 mL/min — ABNORMAL LOW (ref >60)
Glucose, Bld: 349 mg/dL — ABNORMAL HIGH (ref 70–99)
Potassium: 3.9 mmol/L (ref 3.5–5.1)
Sodium: 137 mmol/L (ref 135–145)

## 2018-10-04 LAB — GLUCOSE, CAPILLARY
Glucose-Capillary: 302 mg/dL — ABNORMAL HIGH (ref 70–99)
Glucose-Capillary: 221 mg/dL — ABNORMAL HIGH (ref 70–99)
Glucose-Capillary: 255 mg/dL — ABNORMAL HIGH (ref 70–99)
Glucose-Capillary: 208 mg/dL — ABNORMAL HIGH (ref 70–99)
Glucose-Capillary: 253 mg/dL — ABNORMAL HIGH (ref 70–99)

## 2018-10-04 LAB — CBC
HCT: 34 % — ABNORMAL LOW (ref 39.0–52.0)
Hemoglobin: 10.2 g/dL — ABNORMAL LOW (ref 13.0–17.0)
MCH: 27.9 pg (ref 26.0–34.0)
MCHC: 30 g/dL (ref 30.0–36.0)
MCV: 92.9 fL (ref 80.0–100.0)
Platelets: 202 10*3/uL (ref 150–400)
RBC: 3.66 MIL/uL — AB (ref 4.22–5.81)
RDW: 14.7 % (ref 11.5–15.5)
WBC: 6.6 10*3/uL (ref 4.0–10.5)
nRBC: 0 % (ref 0.0–0.2)

## 2018-10-04 LAB — HEMOGLOBIN A1C
Hgb A1c MFr Bld: 9.8 % — ABNORMAL HIGH (ref 4.8–5.6)
Mean Plasma Glucose: 234.56 mg/dL

## 2018-10-04 MED ORDER — INSULIN GLARGINE 100 UNIT/ML ~~LOC~~ SOLN
25.0000 [IU] | Freq: Two times a day (BID) | SUBCUTANEOUS | Status: DC
  Administered 2018-10-04 – 2018-10-06 (×5): 25 [IU] via SUBCUTANEOUS
  Filled 2018-10-04 (×6): qty 0.25

## 2018-10-04 MED ORDER — GUAIFENESIN ER 600 MG PO TB12
600.0000 mg | ORAL_TABLET | Freq: Two times a day (BID) | ORAL | Status: DC
  Administered 2018-10-04 – 2018-10-06 (×5): 600 mg via ORAL
  Filled 2018-10-04 (×5): qty 1

## 2018-10-04 MED ORDER — GUAIFENESIN-DM 100-10 MG/5ML PO SYRP
10.0000 mL | ORAL_SOLUTION | ORAL | Status: DC | PRN
  Administered 2018-10-04 – 2018-10-06 (×3): 10 mL via ORAL
  Filled 2018-10-04 (×3): qty 10

## 2018-10-04 MED ORDER — INSULIN ASPART 100 UNIT/ML ~~LOC~~ SOLN
6.0000 [IU] | Freq: Three times a day (TID) | SUBCUTANEOUS | Status: DC
  Administered 2018-10-04 – 2018-10-06 (×6): 6 [IU] via SUBCUTANEOUS

## 2018-10-04 MED ORDER — DOXYCYCLINE HYCLATE 100 MG PO TABS
100.0000 mg | ORAL_TABLET | Freq: Two times a day (BID) | ORAL | Status: DC
  Administered 2018-10-04 – 2018-10-06 (×5): 100 mg via ORAL
  Filled 2018-10-04 (×5): qty 1

## 2018-10-04 MED ORDER — SODIUM CHLORIDE 0.9 % IV SOLN
INTRAVENOUS | Status: DC
  Administered 2018-10-04 – 2018-10-05 (×2): via INTRAVENOUS

## 2018-10-04 NOTE — Care Management Note (Addendum)
Case Management Note  Patient Details  Name: [name] MRN: [id number] Date of Birth: 1941/02/23  Subjective/Objective:     Pt admitted with   resp failure - on home oxygen             Action/Plan:   PTA Independent from home with wife - per pt his wife is independent and able to help him at home.  Pt is on 4 liters continuous oxygen at home supplied by [facility] - pt informed CM that his wife will bring portable tank at discharge.  Pt was not willing to discuss daily weight and low sodium diet at this time.  CM will follow back up   Expected Discharge Date:                  Expected Discharge Plan:     In-House Referral:     Discharge planning Services  CM Consult  Post Acute Care Choice:    Choice offered to:     DME Arranged:    DME Agency:     HH Arranged:    HH Agency:     Status of Service:  In process, will continue to follow  If discussed at Long Length of Stay Meetings, dates discussed:    Additional Comments: 10/04/2018 Pt actually informed CM that he was on 7 liters home O2 PTA [name], RN 10/04/2018, 9:45 AM

## 2018-10-04 NOTE — Progress Notes (Addendum)
Pt stated that he doesn't have much help at home.  He states his wife has bladder cancer and cannot help much.

## 2018-10-04 NOTE — Plan of Care (Signed)
Nutrition Education Note  RD consulted for nutrition education regarding CHF and diabetes.  Lab Results  Component Value Date   HGBA1C 9.8 (H) 10/04/2018  PTA DM medications are 6 units insulin aspart TID and 25 units insulin glargine daily.   Labs reviewed: CBGS: [id number] (inpatient orders for glycemic control are 10 units insulin glargine BID, 0-15 units insulin aspart TID with meals, and 0-5 units insulin aspart q HS).    Case discussed with RN, who reports pt has been irritable this shift, but appears to be calmed down after receiving medications.   Spoke with pt, who was very tangential at time of visit. It was difficult to keep pt on task- he continued to ask this RD purpose of visit despite RD explaining to pt multiple times in different ways. Pt reports that his blood sugar was 600 upon admission; per pt it is usually not that high, but usually runs around 300-500. Pt has many barriers to adequate self-management, including low vision and decreased dexterity (this seems to be pt's largest barrier to optimal glycemic control). Due to this, pt reports he checks his blood sugar about once a week "if I'm lucky". Pt lives with his wife, who also has health issues and unable to provide assistance with pt's self-management. Pt reports DM coordinator came to speak with today regarding Freestyle Libre meter and while he is interested is hesitant to try due to expense.   Pt reports consuming 3 meals per day, which consist of convenience foods such as [place] sandwiches, canned tomato soup, canned beef and barley soup, and frozen TV dinners. Pt reports his wife does grocery shopping, but is often forgetful with items.   Pt already received "Living Well with Diabetes" booklet. He reports he did not sleep well last night and prefers to rest. Offered to review handout with pt, but he declined.   RD provided "Heart Healthy, Consistent Carbohydrate Nutrition Therapy" handout from the Academy of Nutrition  and Dietetics. Reviewed patient's dietary recall. Provided examples on ways to decrease sodium intake in diet. Discouraged intake of processed foods and use of salt shaker. Encouraged fresh fruits and vegetables as well as whole grain sources of carbohydrates to maximize fiber intake.   RD discussed why it is important for patient to adhere to diet recommendations, and emphasized the role of fluids, foods to avoid, and importance of weighing self daily.   Discussed different food groups and their effects on blood sugar, emphasizing carbohydrate-containing foods. Provided list of carbohydrates and recommended serving sizes of common foods.  Discussed importance of controlled and consistent carbohydrate intake throughout the day. Provided examples of ways to balance meals/snacks and encouraged intake of high-fiber, whole grain complex carbohydrates. Teach back method used.  Expect fair to poor compliance.  Body mass index is 32.75 kg/m. Pt meets criteria for obesity, class I based on current BMI.  Current diet order is heart healthy/ carb modified, patient is consuming approximately 100% of meals at this time. Labs and medications reviewed. No further nutrition interventions warranted at this time. RD contact information provided. If additional nutrition issues arise, please re-consult RD.   [name] A. Jimmye Norman, RD, LDN, CDE Pager: [phone] After hours Pager: [phone]

## 2018-10-04 NOTE — Evaluation (Addendum)
Physical Therapy Evaluation Patient Details Name: [name] MRN: [id number] DOB: May 24, 1941 Today's Date: 10/04/2018   History of Present Illness  Pt is a 77 y.o. M with significant PMH of COPD, chronic hypoxic and hypercapnic respiratory failure, chronic diastolic CHF, CAD, diabetes type 2, chronic GI bleeding who presents with worsening shortness of breath with bilateral lower extremity swelling, orthopnea, worsening chronic cough.   Clinical Impression  Pt admitted with above diagnosis. At baseline, pt lives with his wife and is a limited household ambulator. He states he and his wife have a strained relationship and she does not assist him with ADL's. He reports, "I have not had a bath since the last time I was in the hospital." On PT evaluation, patient presenting with significantly decreased functional mobility secondary to decreased endurance/activity tolerance, balance impairments, and functional strength deficits. SpO2 decrease to 86% on 4L O2 and HR increase to 117 bpm after walking 15 feet with walker and min guard assist. Had extensive discussion with pt about recommendation for SNF due to decreased caregiver support; pt states, "if I go to rehab, my wife might tell me never to come back." May need ambulance transport if he decides to discharge home as he has 6 steps to enter his home. Will follow acutely to progress mobility. See below for recommendations.    Follow Up Recommendations SNF;Supervision for mobility/OOB (pt likely to refuse; will need max HH services incl HHPT, [place], [place] aide if possible)    Equipment Recommendations  None recommended by PT    Recommendations for Other Services OT consult     Precautions / Restrictions Precautions Precautions: Fall Precaution Comments: watch o2 Restrictions Weight Bearing Restrictions: No      Mobility  Bed Mobility Overal bed mobility: Modified Independent             General bed mobility comments: pt able to  progress from supine > sit without physical assistance  Transfers Overall transfer level: Needs assistance Equipment used: Rolling walker (2 wheeled) Transfers: Sit to/from Stand Sit to Stand: Min guard         General transfer comment: From elevated bed surface  Ambulation/Gait Ambulation/Gait assistance: Min guard Gait Distance (Feet): 15 Feet Assistive device: Rolling walker (2 wheeled) Gait Pattern/deviations: Step-through pattern;Decreased stride length Gait velocity: decreased Gait velocity interpretation: <1.8 ft/sec, indicate of risk for recurrent falls General Gait Details: Pt with limited room ambulation and min guard assist to steady and assistance for line management  Stairs            Wheelchair Mobility    Modified Rankin (Stroke Patients Only)       Balance Overall balance assessment: Needs assistance Sitting-balance support: Feet supported Sitting balance-Leahy Scale: Good     Standing balance support: Bilateral upper extremity supported Standing balance-Leahy Scale: Poor                               Pertinent Vitals/Pain Pain Assessment: No/denies pain    Home Living Family/patient expects to be discharged to:: Private residence Living Arrangements: Spouse/significant other Available Help at Discharge: Family Type of Home: House Home Access: Stairs to enter Entrance Stairs-Rails: Psychiatric nurse of Steps: 6 Home Layout: One level Home Equipment: Environmental consultant - 2 wheels;Cane - single point;Bedside commode      Prior Function Level of Independence: Needs assistance   Gait / Transfers Assistance Needed: walks ~60 ft from bedroom to kitchen with no  assistive device            Hand Dominance        Extremity/Trunk Assessment   Upper Extremity Assessment Upper Extremity Assessment: Generalized weakness    Lower Extremity Assessment Lower Extremity Assessment: Generalized weakness        Communication   Communication: No difficulties  Cognition Arousal/Alertness: Awake/alert Behavior During Therapy: WFL for tasks assessed/performed Overall Cognitive Status: No family/caregiver present to determine baseline cognitive functioning                                 General Comments: decreased awareness of deficits      General Comments      Exercises     Assessment/Plan    PT Assessment Patient needs continued PT services  PT Problem List Decreased strength;Decreased activity tolerance;Decreased balance;Decreased mobility;Cardiopulmonary status limiting activity       PT Treatment Interventions DME instruction;Gait training;Stair training;Functional mobility training;Therapeutic activities;Therapeutic exercise;Balance training;Patient/family education    PT Goals (Current goals can be found in the Care Plan section)  Acute Rehab PT Goals Patient Stated Goal: "go home and get home health." PT Goal Formulation: With patient Time For Goal Achievement: 10/18/18 Potential to Achieve Goals: Fair    Frequency Min 3X/week   Barriers to discharge Decreased caregiver support      Co-evaluation               AM-PAC PT "6 Clicks" Mobility  Outcome Measure Help needed turning from your back to your side while in a flat bed without using bedrails?: None Help needed moving from lying on your back to sitting on the side of a flat bed without using bedrails?: None Help needed moving to and from a bed to a chair (including a wheelchair)?: A Little Help needed standing up from a chair using your arms (e.g., wheelchair or bedside chair)?: A Little Help needed to walk in hospital room?: A Little Help needed climbing 3-5 steps with a railing? : A Lot 6 Click Score: 19    End of Session Equipment Utilized During Treatment: Gait belt;Oxygen Activity Tolerance: Patient tolerated treatment well Patient left: with bed alarm set;with call bell/phone within  reach;Other (comment)(seated EOB) Nurse Communication: Mobility status PT Visit Diagnosis: Unsteadiness on feet (R26.81);Difficulty in walking, not elsewhere classified (R26.2)    Time: 9381-8299 PT Time Calculation (min) (ACUTE ONLY): 46 min   Charges:   PT Evaluation $PT Eval Moderate Complexity: 1 Mod PT Treatments $Therapeutic Activity: 23-37 mins       Ellamae Sia, PT, DPT Acute Rehabilitation Services Pager [phone] Office [phone]   Willy Eddy 10/04/2018, 3:06 PM

## 2018-10-04 NOTE — Progress Notes (Signed)
Spoke with patient again about his diabetes as follow up to previous visit from our glycemic team.  Seems to be feeling better today, but concerned about what the tests that he is having done.  States that he takes Lantus and Novolog at home, but sometimes forgets to take. Has poor vision and cannot see his glucose meter very well. Suggested a Freestyle Libre continuous glucose monitor for his arm, but states that he has inquired about it with his insurance company. States that it seems too expensive. Suggested that he could get a talking glucose meter if not too expensive.  Will continue to monitor blood sugars while in the hospital.   Harvel Ricks RN BSN CDE Diabetes Coordinator Pager: [phone]  8am-5pm

## 2018-10-04 NOTE — Progress Notes (Signed)
[facility]  PROGRESS NOTE    [name]  BMW:[id number] DOB: 11/06/1940 DOA: 10/02/2018 PCP: [name], MD   Brief Narrative: 77 year old male with history of COPD, chronic hypoxic and hypercapnic respiratory failure, chronic diastolic CHF, CAD insulin-dependent diabetes, chronic GI bleeding, anxiety presented to the ER 12/17 with worsening shortness of breath for more than a week with bilateral lower extremity swelling, orthopnea, worsening of chronic cough.  In the ER patient had markedly hyperglycemic, BNP mildly up at 230, chest x-ray showed "Stable cardiomegaly. Increasing interstitial prominence most consistent with pulmonary edema. Small LEFT pleural effusion.RIGHT lung base atelectasis/scarring,Emphysema" . Patient was initially diuresed Lasix currently on hold. was given Solu-Medrol and nebs and was admitted.  Assessment & Plan:   Acute on chronic hypoxic and hypercapnic respiratory failure : Patient needed BiPAP initially.  Currently on 4 L nasal cannula.  Suspect multifactorial in the setting of COPD, CTA showed tracheobronchomalacia with superimposed bronchial wall thickening suspecting bronchitis.. Patient reports he is normally on7 L and follows up with the pulmonary.Echocardiogram no significant abnormalities, normal lvef and diastolic fun.  Continue on bronchodilator therapy.  Holding off on a steroid due to uncontrolled hyperglycemia as respiratory status is stable currently.  Continue inhaled corticosteroids, Incruse Ellipta and nebs.  No evidence of pneumonia on the CT scan.  De-escalate antibiotics to cover for bronchitis.  Leukocytosis: Patient did get a steroid on admission which could have contributed to his leukocytosis. He is afebrile.  Leukocytosis resolved.  On Zosyn, will de-escalate antibiotics. Influenza screen was negative.  Blood cultures no growth so far.  Possible aspiration PNEUMONIA on admission: no pneumonia seen in CT. deescalte antibiotics  Elevated  d-dimer, patient had shortness of breath and pleuritic chest pain on admission.  Duplex of the lower leg was negative.  VQ scan was intermediate probability, CTA chest obtained and no evidence of PE. Echo reviewed  and no significant abnormalities.   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  Uncontrolled hyperglycemia around 500 on admission: Patient is off insulin drip, on home Lantus, and sliding scale, resume pre-meal insulin.  Blood sugar improving. hba1c is at 9.8.  Mild AKI: creat up at 1.4. Continue on IV fluids hydration, monitor overnight given contrast use.    Lactic acidosis suspect this is multifactorial secondary respiratory distress, uncontrolled hyperglycemia: Lactate on admission 5.2 and down trended to 3.0.  Normocytic anemia with history of chronic GI bleeding, hemoglobin is stable in 10 to 11 g.  Monitor.  FOBT was negative.  Anxiety disorder: Continue his Celexa and as needed Xanax.  UVO:[id number]  DVT prophylaxis: Lovenox [place] Code Status: Full code Family Communication: No family at the bedside Disposition Plan:  Med/surg. PT/OT eval for dispo.  Consultants: NONE  Procedures: TTE "Left ventricle: The cavity size was normal. Wall thickness was   increased in a pattern of moderate LVH. Systolic function was   normal. The estimated ejection fraction was in the range of 60%   to 65%. Left ventricular diastolic function parameters were   normal. - Aortic valve: There was very mild stenosis. There was mild   regurgitation. Valve area (VTI): 2.16 cm^2. Valve area (Vmax):   2.54 cm^2. Valve area (Vmean): 2.25 cm"  VQ scan:  Diminished ventilation and perfusion in LEFT lower lobe with matching radiographic opacity in the LEFT lower lobe on chest radiograph of 10/02/2018.  Large triple matched lower lobe abnormalities  represent an intermediate probability for pulmonary embolism.  CTA chest  1. No acute pulmonary embolism. 2. 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 Scattered atelectasis. 3. Large hiatal hernia. 4. **An incidental finding of potential clinical significance has been found. 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 Recommend annual imaging followup by CTA or MRA. This recommendation follows 2010 ACCF/AHA/AATS/ACR/ASA/SCA/SCAI/SIR/STS/SVM Guidelines for the Diagnosis and Management of Patients wit Antimicrobials: Doxycycline  Subjective: Seen this morning.  Complaints of bout of cough intermittently and also shortness of breath.  Overall much improved.  Remains on 4 L nasal cannula.  Denies chest pain nausea vomiting fever or chills.  Objective: Vitals:   10/04/18 0255 10/04/18 0718 10/04/18 0733 10/04/18 1046  BP: 138/82 (!) 157/82    Pulse: 95     Resp: 19 17  17   Temp: 98.2 F (36.8 C) (!) 97.5 F (36.4 C)    TempSrc: Oral Oral    SpO2: 97%  97%   Weight:      Height:        Intake/Output Summary (Last 24 hours) at 10/04/2018 1058 Last data filed at 10/04/2018 0718 Gross per 24 hour  Intake 840 ml  Output 2401 ml  Net -1561 ml   Filed Weights   10/02/18 0123  Weight: 118.8 kg   Weight change:   Body mass index is 32.75 kg/m.  Examination:  General exam: Calm comfortable, not in acute distress, obese.   HEENT:Oral mucosa moist, Ear/Nose WNL grossly Respiratory system: Bilateral diminished breath sounds, no crackles and wheezing Cardiovascular system: S1 & S2 heard, No JVD/murmurs.No pedal edema. Gastrointestinal system: Abdomen is soft, nontender, obese and bowel sounds present.   Nervous System:Alert/awake/oriented at baseline.  Able to move UE and LE Extremities: Bilateral pitting leg edema right more than left, distal pulses palpable.   Skin: No rashes,no icterus. MSK: Normal muscle  bulk,tone ,power  Data Reviewed: I have personally reviewed following labs and imaging studies  CBC: Recent Labs  Lab 10/02/18 0051 10/02/18 0108 10/03/18 0119 10/04/18 0159  WBC 6.5  --  16.8* 6.6  NEUTROABS 4.2  --   --   --   HGB 10.2* 11.2* 10.9* 10.2*  HCT 35.7* 33.0* 36.7* 34.0*  MCV 95.2  --  91.3 92.9  PLT 173  --  238 465   Basic Metabolic Panel: Recent Labs  Lab 10/02/18 0051 10/02/18 0108 10/03/18 0119 10/04/18 0159  NA 137 136 140 137  K 4.2 4.2 4.5 3.9  CL 94* 95* 92* 95*  CO2 32  --  33* 33*  GLUCOSE 500* 515* 110* 349*  BUN 12 14 27* 29*  CREATININE 1.15 1.00 1.48* 1.40*  CALCIUM 8.5*  --  9.3 8.3*  MG  --   --  2.2  --    GFR: Estimated Creatinine Clearance: 62.3 mL/min (A) (by C-G formula based on SCr of 1.4 mg/dL (H)). Liver Function Tests: Recent Labs  Lab 10/02/18 0051 10/03/18 0119  AST 19 26  ALT 14 15  ALKPHOS 121 108  BILITOT 0.6 0.5  PROT 6.7 7.3  ALBUMIN 3.1* 3.5   No results for input(s): LIPASE, AMYLASE in the last 168 hours. No results for input(s): AMMONIA in the last 168 hours. Coagulation Profile: Recent Labs  Lab 10/02/18 0051  INR 1.01   Cardiac Enzymes: No results for input(s): CKTOTAL, CKMB, CKMBINDEX, TROPONINI in the last 168 hours. BNP (last 3 results) No results  for input(s): PROBNP in the last 8760 hours. HbA1C: Recent Labs    10/04/18 0159  HGBA1C 9.8*   CBG: Recent Labs  Lab 10/03/18 1137 10/03/18 1624 10/03/18 2032 10/04/18 0304 10/04/18 0801  GLUCAP 117* 396* 378* 302* 221*   Lipid Profile: No results for input(s): CHOL, HDL, LDLCALC, TRIG, CHOLHDL, LDLDIRECT in the last 72 hours. Thyroid Function Tests: No results for input(s): TSH, T4TOTAL, FREET4, T3FREE, THYROIDAB in the last 72 hours. Anemia Panel: No results for input(s): VITAMINB12, FOLATE, FERRITIN, TIBC, IRON, RETICCTPCT in the last 72 hours. Sepsis Labs: Recent Labs  Lab 10/02/18 0109 10/02/18 1704 10/02/18 2130  10/03/18 0119  LATICACIDVEN 2.68* 5.2* 3.5* 3.0*    Recent Results (from the past 240 hour(s))  MRSA PCR Screening     Status: None   Collection Time: 10/02/18  8:12 PM  Result Value Ref Range Status   MRSA by PCR NEGATIVE NEGATIVE Final    Comment:        The GeneXpert MRSA Assay (FDA approved for NASAL specimens only), is one component of a comprehensive MRSA colonization surveillance program. It is not intended to diagnose MRSA infection nor to guide or monitor treatment for MRSA infections. Performed at [facility], [place] [address], [place], [place] [address]   Culture, blood (routine x 2) Call MD if unable to obtain prior to antibiotics being given     Status: None (Preliminary result)   Collection Time: 10/02/18 10:00 PM  Result Value Ref Range Status   Specimen Description BLOOD RIGHT HAND  Final   Special Requests   Final    BOTTLES DRAWN AEROBIC ONLY Blood Culture adequate volume   Culture   Final    NO GROWTH 2 DAYS Performed at [facility], [place] [address] [place], [place] [address]    Report Status PENDING  Incomplete  Culture, blood (routine x 2) Call MD if unable to obtain prior to antibiotics being given     Status: None (Preliminary result)   Collection Time: 10/02/18 10:10 PM  Result Value Ref Range Status   Specimen Description BLOOD RIGHT FINGER  Final   Special Requests   Final    BOTTLES DRAWN AEROBIC ONLY Blood Culture adequate volume   Culture   Final    NO GROWTH 2 DAYS Performed at [facility], [address] [place], [place] [address]    Report Status PENDING  Incomplete      Radiology Studies: Ct Angio Chest Pe W Or Wo Contrast  Result Date: 10/03/2018 CLINICAL DATA:  Hypoxia and shortness of breath. Abnormal V/Q examination. EXAM: CT ANGIOGRAPHY CHEST WITH CONTRAST TECHNIQUE: Multidetector CT imaging of the chest was performed using the standard protocol during bolus administration of intravenous contrast. Multiplanar  CT image reconstructions and MIPs were obtained to evaluate the vascular anatomy. CONTRAST:  127mL ISOVUE-370 IOPAMIDOL (ISOVUE-370) INJECTION 76% COMPARISON:  V/Q chest October 03, 2018 and chest radiograph October 02, 2018 and CT chest May 08, 2018 FINDINGS: CARDIOVASCULAR: Adequate contrast opacification of the pulmonary artery's. Main pulmonary artery is not enlarged. No pulmonary arterial filling defects to the level of the subsegmental branches. The heart is upper limits of normal in size. No pericardial effusion. Moderate coronary artery calcifications. No pericardial effusion. 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 Mild calcific atherosclerosis, 2 vessel arch is a normal variant. MEDIASTINUM/NODES: No lymphadenopathy by CT size criteria. Prominent mediastinal fat. LUNGS/PLEURA: Tracheobronchial tree is patent, no pneumothorax. Bronchial wall thickening. Increasingly narrowed tracheobronchial tree  seen with tracheobronchomalacia with collapse of LEFT lower lobe bronchi. Bilateral lower lobe atelectasis, pleural fat extending into RIGHT major fissure. LEFT upper lobe atelectasis versus scarring. LEFT apical bulla. No pleural effusion. UPPER ABDOMEN: Non-acute.  Large hiatal hernia. MUSCULOSKELETAL: Non-acute. Status post median sternotomy. 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 Review of the MIP images confirms the above findings. IMPRESSION: 1. No acute pulmonary embolism. 2. 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 Scattered atelectasis. 3. Large hiatal hernia. 4. **An incidental finding of potential clinical significance has been found. 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 Recommend annual imaging followup by CTA or MRA. This recommendation follows 2010 ACCF/AHA/AATS/ACR/ASA/SCA/SCAI/SIR/STS/SVM Guidelines for the Diagnosis and Management of Patients with Thoracic Aortic Disease. Circulation. 2010; 121: T017-B939** Aortic  Atherosclerosis (ICD10-I70.0). Electronically Signed   By: Elon Alas M.D.   On: 10/03/2018 22:35   Nm Pulmonary Perf And Vent  Result Date: 10/03/2018 CLINICAL DATA:  Elevated D-dimer question pulmonary embolism, worsening shortness of breath, known history of CHF, COPD, essential hypertension, type II diabetes mellitus EXAM: NUCLEAR MEDICINE VENTILATION - PERFUSION LUNG SCAN TECHNIQUE: Ventilation images were obtained in multiple projections using inhaled aerosol Tc-18m DTPA. Perfusion images were obtained in multiple projections after intravenous injection of Tc-79m MAA. RADIOPHARMACEUTICALS:  30 mCi of Tc-39m DTPA aerosol inhalation and 4 mCi Tc75m MAA IV COMPARISON:  None Correlation: Chest radiograph 10/02/2018 FINDINGS: Ventilation: Central airway deposition of aerosol. Diminished ventilation in LEFT lower lobe. Enlargement of cardiac silhouette. Perfusion: Diminished perfusion in LEFT lower lobe though matching the ventilation defect. Single small subsegmental perfusion defect at superior segment RIGHT lower lobe. No additional perfusion defects identified. Enlargement of cardiac silhouette. Chest radiograph: Enlargement of cardiac silhouette post CABG with bibasilar atelectasis greater on LEFT. IMPRESSION: Cardiomegaly. Diminished ventilation and perfusion in LEFT lower lobe with matching radiographic opacity in the LEFT lower lobe on chest radiograph of 10/02/2018. Large triple matched lower lobe abnormalities represent an intermediate probability for pulmonary embolism. Electronically Signed   By: Lavonia Dana M.D.   On: 10/03/2018 14:12     Scheduled Meds: . citalopram  20 mg Oral Daily  . enoxaparin (LOVENOX) injection  40 mg Subcutaneous Q24H  . gabapentin  300 mg Oral QID  . guaiFENesin  600 mg Oral BID  . Influenza vac split quadrivalent PF  0.5 mL Intramuscular Tomorrow-1000  . insulin aspart  0-15 Units Subcutaneous TID WC  . insulin aspart  0-5 Units Subcutaneous QHS  .  insulin glargine  25 Units Subcutaneous BID  . mometasone-formoterol  2 puff Inhalation BID  . pantoprazole  40 mg Oral Daily  . sucralfate  1 g Oral TID WC & HS  . umeclidinium bromide  1 puff Inhalation Daily   Continuous Infusions: . sodium chloride 75 mL/hr at 10/02/18 2030  . piperacillin-tazobactam (ZOSYN)  IV 3.375 g (10/04/18 0512)     LOS: 2 days   Time spent: More than 50% of that time was spent in counseling and/or coordination of care.  Antonieta Pert, MD [facility] Pager [phone]  If 7PM-7AM, please contact night-coverage [email or web address] Password [email or web address] 10/04/2018, 10:58 AM

## 2018-10-05 ENCOUNTER — Telehealth: Payer: Self-pay | Admitting: Pulmonary Disease

## 2018-10-05 DIAGNOSIS — J9602 Acute respiratory failure with hypercapnia: Secondary | ICD-10-CM

## 2018-10-05 DIAGNOSIS — I2510 Atherosclerotic heart disease of native coronary artery without angina pectoris: Secondary | ICD-10-CM

## 2018-10-05 DIAGNOSIS — I5033 Acute on chronic diastolic (congestive) heart failure: Secondary | ICD-10-CM

## 2018-10-05 DIAGNOSIS — J439 Emphysema, unspecified: Secondary | ICD-10-CM

## 2018-10-05 DIAGNOSIS — J9621 Acute and chronic respiratory failure with hypoxia: Secondary | ICD-10-CM

## 2018-10-05 DIAGNOSIS — J9601 Acute respiratory failure with hypoxia: Secondary | ICD-10-CM

## 2018-10-05 DIAGNOSIS — J9622 Acute and chronic respiratory failure with hypercapnia: Secondary | ICD-10-CM

## 2018-10-05 DIAGNOSIS — D500 Iron deficiency anemia secondary to blood loss (chronic): Secondary | ICD-10-CM

## 2018-10-05 LAB — GLUCOSE, CAPILLARY
Glucose-Capillary: 223 mg/dL — ABNORMAL HIGH (ref 70–99)
Glucose-Capillary: 275 mg/dL — ABNORMAL HIGH (ref 70–99)
Glucose-Capillary: 155 mg/dL — ABNORMAL HIGH (ref 70–99)
Glucose-Capillary: 204 mg/dL — ABNORMAL HIGH (ref 70–99)

## 2018-10-05 LAB — BASIC METABOLIC PANEL
Anion gap: 11 (ref 5–15)
BUN: 18 mg/dL (ref 8–23)
CO2: 30 mmol/L (ref 22–32)
Calcium: 8.4 mg/dL — ABNORMAL LOW (ref 8.9–10.3)
Chloride: 99 mmol/L (ref 98–111)
Creatinine, Ser: 1.18 mg/dL (ref 0.61–1.24)
GFR calc Af Amer: >60 mL/min (ref >60)
GFR calc non Af Amer: 60 mL/min — ABNORMAL LOW (ref >60)
Glucose, Bld: 233 mg/dL — ABNORMAL HIGH (ref 70–99)
Potassium: 3.7 mmol/L (ref 3.5–5.1)
Sodium: 140 mmol/L (ref 135–145)

## 2018-10-05 LAB — OCCULT BLOOD X 1 CARD TO LAB, STOOL: Fecal Occult Bld: NEGATIVE

## 2018-10-05 NOTE — Care Management Important Message (Signed)
Important Message  Patient Details  Name: [name] MRN: [id number] Date of Birth: 1941-08-16   Medicare Important Message Given:  Yes    [name] [place] 10/05/2018, 3:41 PM

## 2018-10-05 NOTE — Clinical Social Work Note (Addendum)
Per MD, patient does not want SNF placement. Plan for discharge home tomorrow. RNCM aware.  CSW signing off. Consult again if any other social work needs arise.  Dayton Scrape, [place]

## 2018-10-05 NOTE — Care Management Note (Addendum)
Case Management Note  Patient Details  Name: [name] MRN: [id number] Date of Birth: [birth year]  Subjective/Objective:     Pt admitted with   resp failure - on home oxygen             Action/Plan:   PTA Independent from home with wife - per pt his wife is independent and able to help him at home.  Pt is on 4 liters continuous oxygen at home supplied by [facility] - pt informed CM that his wife will bring portable tank at discharge.  Pt was not willing to discuss daily weight and low sodium diet at this time.  CM will follow back up   Expected Discharge Date:                  Expected Discharge Plan:     In-House Referral:     Discharge planning Services  CM Consult  Post Acute Care Choice:    Choice offered to:  Patient  DME Arranged:    DME Agency:     HH Arranged:  RN, PT, OT, Social Work, Nurse's Aide, Refused SNF [place] Agency:  [facility]  Status of Service:  In process, will continue to follow  If discussed at Long Length of Stay Meetings, dates discussed:    Additional Comments: 10/05/2018  Pt chose [facility] - agency contacted and referral accepted. Pt declined PTAR transport home - pt informed CM that he has cast iron railing that he plans to hold on to when climbing stairs into the home - pt has a one story home.  CM requested [place] orders  Pt recommended for SNF - however pt refusing.  CM provided pt medicare.gov HH list and place a copy of list on shadow chart.  CM will follow back up with pt once choice is made.  Pt may need PTAR transport home  10/04/18 Pt actually informed CM that he was on 7 liters home O2 PTA [name], RN 10/05/2018, 12:02 PM

## 2018-10-05 NOTE — Telephone Encounter (Signed)
Called and left message with Dr. Lillette Boxer

## 2018-10-05 NOTE — Evaluation (Signed)
Occupational Therapy Evaluation Patient Details Name: [name] MRN: [id number] DOB: 09/01/1941 Today's Date: 10/05/2018    History of Present Illness Pt is a 77 y.o. M with significant PMH of COPD, chronic hypoxic and hypercapnic respiratory failure, chronic diastolic CHF, CAD, diabetes type 2, chronic GI bleeding who presents with worsening shortness of breath with bilateral lower extremity swelling, orthopnea, worsening chronic cough.    Clinical Impression   Pt reports he leads a sedentary lifestyle, but is able to prepare simple meals, walk in his home without a device and perform self care (sponge bathes) independently with difficulty. Pt rarely wears socks and uses slip on shoes. Pt was not receptive to education in use of AE or considering a rollator. He is adamant he will go home and refuses post acute rehab anywhere but home. Will follow acutely.    Follow Up Recommendations  Home health OT    Equipment Recommendations  (rollator)    Recommendations for Other Services       Precautions / Restrictions Precautions Precautions: Fall Precaution Comments: watch o2 Restrictions Weight Bearing Restrictions: No      Mobility Bed Mobility Overal bed mobility: Modified Independent                Transfers Overall transfer level: Needs assistance Equipment used: Rolling walker (2 wheeled) Transfers: Sit to/from Stand Sit to Stand: Min guard         General transfer comment: From elevated bed surface    Balance Overall balance assessment: Needs assistance Sitting-balance support: Feet supported Sitting balance-Leahy Scale: Good     Standing balance support: Single extremity supported Standing balance-Leahy Scale: Poor Standing balance comment: requires one hand stabilization during pericare                           ADL either performed or assessed with clinical judgement   ADL Overall ADL's : Needs assistance/impaired Eating/Feeding:  Independent;Sitting   Grooming: Set up;Sitting   Upper Body Bathing: Minimal assistance;Sitting Upper Body Bathing Details (indicate cue type and reason): recommended long handled bath sponge Lower Body Bathing: Moderate assistance;Sit to/from stand Lower Body Bathing Details (indicate cue type and reason): pt is able to cross foot over opposite knee, but reports he does not wash his feet typically, recommended long handled bath sponge and reacher Upper Body Dressing : Set up;Sitting   Lower Body Dressing: Minimal assistance;Sit to/from stand Lower Body Dressing Details (indicate cue type and reason): pt not interested in sock aide, avoids socks, reports he typically can start his pants over his feet at home     Toileting- Clothing Manipulation and Hygiene: Maximal assistance;Sit to/from stand Toileting - Clothing Manipulation Details (indicate cue type and reason): pt reports he can typically manage pericare on his toilet at home       General ADL Comments: pt reports he fatigues easily, not receptive to recommendations for AE or use of rollator to assist with energy conservation or carrying items from kitchen at home     Vision Baseline Vision/History: Wears glasses Wears Glasses: At all times Patient Visual Report: No change from baseline       Perception     Praxis      Pertinent Vitals/Pain Pain Assessment: No/denies pain     Hand Dominance Right   Extremity/Trunk Assessment Upper Extremity Assessment Upper Extremity Assessment: RUE deficits/detail;LUE deficits/detail RUE Deficits / Details: pain in shoulder, currently on heating pad RUE Coordination: decreased fine motor  LUE Deficits / Details: generalized weakness, h/o steroid injection in shoulder LUE Coordination: decreased fine motor   Lower Extremity Assessment Lower Extremity Assessment: Defer to PT evaluation   Cervical / Trunk Assessment Cervical / Trunk Assessment: Other exceptions Cervical / Trunk  Exceptions: reports pain with bending over due to ulcers and stents in groin   Communication Communication Communication: HOH   Cognition Arousal/Alertness: Awake/alert Behavior During Therapy: WFL for tasks assessed/performed Overall Cognitive Status: No family/caregiver present to determine baseline cognitive functioning                                 General Comments: decreased awareness of deficits   General Comments       Exercises     Shoulder Instructions      Home Living Family/patient expects to be discharged to:: Private residence Living Arrangements: Spouse/significant other Available Help at Discharge: Family Type of Home: House Home Access: Stairs to enter Technical brewer of Steps: 6 Entrance Stairs-Rails: Right;Left Home Layout: One level     Bathroom Shower/Tub: Other (comment)(pt sponge bathes)   Bathroom Toilet: Standard     Home Equipment: Environmental consultant - 2 wheels;Cane - single point;Bedside commode;Other (comment)(02)          Prior Functioning/Environment Level of Independence: Needs assistance  Gait / Transfers Assistance Needed: walks ~60 ft from bedroom to kitchen with no assistive device  ADL's / Homemaking Assistance Needed: prepares simple meals, sponge bathes, dresses and toilets independently seated on toilet, uses electric shaver, does not wear socks   Comments: pt reports he leads a sedentary lifestyle        OT Problem List: Decreased strength;Decreased activity tolerance;Impaired balance (sitting and/or standing);Decreased knowledge of use of DME or AE;Decreased safety awareness;Obesity;Cardiopulmonary status limiting activity      OT Treatment/Interventions: Self-care/ADL training;Balance training;Patient/family education;Therapeutic activities;DME and/or AE instruction;Energy conservation    OT Goals(Current goals can be found in the care plan section) Acute Rehab OT Goals Patient Stated Goal: "go home and get  home health for 6 months" OT Goal Formulation: With patient Time For Goal Achievement: 10/19/18 Potential to Achieve Goals: Good ADL Goals Pt Will Perform Grooming: with modified independence;sitting(at sink) Pt Will Transfer to Toilet: with modified independence;ambulating;bedside commode(with rollator) Pt Will Perform Toileting - Clothing Manipulation and hygiene: with modified independence;sit to/from stand Additional ADL Goal #1: Pt will be knowledgeble in availability and use of reacher and long handled bath sponge. Additional ADL Goal #2: (P) Pt will utilize energy conservation and breathing techniques during ADL and mobility.  OT Frequency: Min 2X/week   Barriers to D/C:            Co-evaluation              AM-PAC OT "6 Clicks" Daily Activity     Outcome Measure Help from another person eating meals?: None Help from another person taking care of personal grooming?: A Little Help from another person toileting, which includes using toliet, bedpan, or urinal?: A Lot Help from another person bathing (including washing, rinsing, drying)?: A Lot Help from another person to put on and taking off regular upper body clothing?: A Little Help from another person to put on and taking off regular lower body clothing?: A Lot 6 Click Score: 16   End of Session Equipment Utilized During Treatment: Gait belt;Rolling walker;Oxygen  Activity Tolerance: Patient limited by fatigue Patient left: in bed;with call bell/phone within reach  OT Visit Diagnosis: Unsteadiness on feet (R26.81)                Time: 7322-0254 OT Time Calculation (min): 31 min Charges:  OT General Charges $OT Visit: 1 Visit OT Evaluation $OT Eval Moderate Complexity: 1 Mod OT Treatments $Self Care/Home Management : 8-22 mins  Nestor Lewandowsky, OTR/L Acute Rehabilitation Services Pager: [phone] Office: [phone]  Malka So 10/05/2018, 12:17 PM

## 2018-10-05 NOTE — Progress Notes (Addendum)
PROGRESS NOTE    [name]  JQZ:[id number]  DOB: 1941-02-01  DOA: 10/02/2018 PCP: [name], MD  Brief Narrative:  77 year old male with history of COPD, chronic hypoxic and hypercapnic respiratory failure on home 02 4-7 lits [place], chronic diastolic CHF, CAD insulin-dependent diabetes, chronic GI bleeding, anxiety presented to the ER 12/17 with worsening shortness of breath for more than a week with bilateral lower extremity swelling, orthopnea, worsening of chronic cough.  In the ER patient was markedly hyperglycemic, BNP was mildly up at 230, chest x-ray showed "Stable cardiomegaly. Increasing interstitial prominence most consistent with pulmonary edema. Small LEFT pleural effusion.RIGHT lung base atelectasis/scarring,Emphysema" . Patient required BiPAP on presentation, was initially diuresed, given Solu-Medrol/ nebs and was admitted for further evaluation and management.  Patient follows Dr. Lake Bells with pulmonary as outpatient and was apparently advised to use 4 L continuous O2 but he was using 7 L for symptom relief.  He has not seen pulmonary for more than a year now but uses albuterol/Symbicort inhaler daily. Subjective:  Patient currently saturating well on 3 L nasal cannula and able to maintain O2 sat greater than 95% with minimal exertion in the room (noted to be transitioning from commode to the bed while talking without significant shortness of breath or hypoxia).  He states his leg swellings are improved since admission.  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  He has refused rehab placement and insists on discharge home.  Objective: Vitals:   10/05/18 0800 10/05/18 0921 10/05/18 0924 10/05/18 1146  BP: (!) 172/100   (!) 156/96  Pulse: 94   (!) 104  Resp: 19   19  Temp:    97.8 F (36.6 C)  TempSrc:    Oral  SpO2: 96% 93% 96% 96%  Weight:        Height:        Intake/Output Summary (Last 24 hours) at 10/05/2018 1311 Last data filed at 10/05/2018 1127 Gross per 24 hour  Intake 810.38 ml  Output 2001 ml  Net -1190.62 ml   Filed Weights   10/02/18 0123 10/05/18 0618  Weight: 118.8 kg 123.7 kg    Physical Examination:  General exam: Appears comfortable on 4 L nasal cannula,  Respiratory system: Clear to auscultation. Respiratory effort normal. Cardiovascular system: S1 & S2 heard, RRR. No JVD, murmurs, rubs, gallops or clicks. No pedal edema. Gastrointestinal system: Abdomen is obese, nondistended, soft and nontender. No organomegaly or masses felt. Normal bowel sounds heard. Central nervous system: Alert and oriented. No focal neurological deficits. Extremities: Symmetric 5 x 5 power.  Trace pitting edema Skin: No rashes, lesions or ulcers Psychiatry: argumentative and defensive during conversation     Data Reviewed: I have personally reviewed following labs and imaging studies  CBC: Recent Labs  Lab 10/02/18 0051 10/02/18 0108 10/03/18 0119 10/04/18 0159  WBC 6.5  --  16.8* 6.6  NEUTROABS 4.2  --   --   --   HGB 10.2* 11.2* 10.9* 10.2*  HCT 35.7* 33.0* 36.7* 34.0*  MCV 95.2  --  91.3 92.9  PLT 173  --  238 622   Basic Metabolic Panel: Recent Labs  Lab 10/02/18 0051 10/02/18 0108 10/03/18 0119 10/04/18 0159 10/05/18 0315  NA 137 136 140 137 140  K 4.2 4.2 4.5 3.9 3.7  CL 94* 95* 92* 95* 99  CO2 32  --  33* 33* 30  GLUCOSE  500* 515* 110* 349* 233*  BUN 12 14 27* 29* 18  CREATININE 1.15 1.00 1.48* 1.40* 1.18  CALCIUM 8.5*  --  9.3 8.3* 8.4*  MG  --   --  2.2  --   --    GFR: Estimated Creatinine Clearance: 75.5 mL/min (by C-G formula based on SCr of 1.18 mg/dL). Liver Function Tests: Recent Labs  Lab 10/02/18 0051 10/03/18 0119  AST 19 26  ALT 14 15  ALKPHOS 121 108  BILITOT 0.6 0.5  PROT 6.7 7.3  ALBUMIN 3.1* 3.5   No results for input(s): LIPASE, AMYLASE in the last 168 hours. No  results for input(s): AMMONIA in the last 168 hours. Coagulation Profile: Recent Labs  Lab 10/02/18 0051  INR 1.01   Cardiac Enzymes: No results for input(s): CKTOTAL, CKMB, CKMBINDEX, TROPONINI in the last 168 hours. BNP (last 3 results) No results for input(s): PROBNP in the last 8760 hours. HbA1C: Recent Labs    10/04/18 0159  HGBA1C 9.8*   CBG: Recent Labs  Lab 10/04/18 1143 10/04/18 1654 10/04/18 1945 10/05/18 0756 10/05/18 1149  GLUCAP 255* 208* 253* 223* 275*   Lipid Profile: No results for input(s): CHOL, HDL, LDLCALC, TRIG, CHOLHDL, LDLDIRECT in the last 72 hours. Thyroid Function Tests: No results for input(s): TSH, T4TOTAL, FREET4, T3FREE, THYROIDAB in the last 72 hours. Anemia Panel: No results for input(s): VITAMINB12, FOLATE, FERRITIN, TIBC, IRON, RETICCTPCT in the last 72 hours. Sepsis Labs: Recent Labs  Lab 10/02/18 0109 10/02/18 1704 10/02/18 2130 10/03/18 0119  LATICACIDVEN 2.68* 5.2* 3.5* 3.0*    Recent Results (from the past 240 hour(s))  MRSA PCR Screening     Status: None   Collection Time: 10/02/18  8:12 PM  Result Value Ref Range Status   MRSA by PCR NEGATIVE NEGATIVE Final    Comment:        The GeneXpert MRSA Assay (FDA approved for NASAL specimens only), is one component of a comprehensive MRSA colonization surveillance program. It is not intended to diagnose MRSA infection nor to guide or monitor treatment for MRSA infections. Performed at [facility], [place] [address], [place], [place] [address]   Culture, blood (routine x 2) Call MD if unable to obtain prior to antibiotics being given     Status: None (Preliminary result)   Collection Time: 10/02/18 10:00 PM  Result Value Ref Range Status   Specimen Description BLOOD RIGHT HAND  Final   Special Requests   Final    BOTTLES DRAWN AEROBIC ONLY Blood Culture adequate volume   Culture   Final    NO GROWTH 3 DAYS Performed at [facility], [place] [address]  [place], [place] [address]    Report Status PENDING  Incomplete  Culture, blood (routine x 2) Call MD if unable to obtain prior to antibiotics being given     Status: None (Preliminary result)   Collection Time: 10/02/18 10:10 PM  Result Value Ref Range Status   Specimen Description BLOOD RIGHT FINGER  Final   Special Requests   Final    BOTTLES DRAWN AEROBIC ONLY Blood Culture adequate volume   Culture   Final    NO GROWTH 3 DAYS Performed at [facility], [address] [place], [place] [address]    Report Status PENDING  Incomplete      Radiology Studies: Ct Angio Chest Pe W Or Wo Contrast  Result Date: 10/03/2018 CLINICAL DATA:  Hypoxia and shortness of breath. Abnormal  V/Q examination. EXAM: CT ANGIOGRAPHY CHEST WITH CONTRAST TECHNIQUE: Multidetector CT imaging of the chest was performed using the standard protocol during bolus administration of intravenous contrast. Multiplanar CT image reconstructions and MIPs were obtained to evaluate the vascular anatomy. CONTRAST:  176mL ISOVUE-370 IOPAMIDOL (ISOVUE-370) INJECTION 76% COMPARISON:  V/Q chest October 03, 2018 and chest radiograph October 02, 2018 and CT chest May 08, 2018 FINDINGS: CARDIOVASCULAR: Adequate contrast opacification of the pulmonary artery's. Main pulmonary artery is not enlarged. No pulmonary arterial filling defects to the level of the subsegmental branches. The heart is upper limits of normal in size. No pericardial effusion. Moderate coronary artery calcifications. No pericardial effusion. 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 Mild calcific atherosclerosis, 2 vessel arch is a normal variant. MEDIASTINUM/NODES: No lymphadenopathy by CT size criteria. Prominent mediastinal fat. LUNGS/PLEURA: Tracheobronchial tree is patent, no pneumothorax. Bronchial wall thickening. 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 Bilateral lower lobe atelectasis,  pleural fat extending into RIGHT major fissure. LEFT upper lobe atelectasis versus scarring. LEFT apical bulla. No pleural effusion. UPPER ABDOMEN: Non-acute.  Large hiatal hernia. MUSCULOSKELETAL: Non-acute. Status post median sternotomy. 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 Review of the MIP images confirms the above findings. IMPRESSION: 1. No acute pulmonary embolism. 2. 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 Scattered atelectasis. 3. Large hiatal hernia. 4. **An incidental finding of potential clinical significance has been found. 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 Recommend annual imaging followup by CTA or MRA. This recommendation follows 2010 ACCF/AHA/AATS/ACR/ASA/SCA/SCAI/SIR/STS/SVM Guidelines for the Diagnosis and Management of Patients with Thoracic Aortic Disease. Circulation. 2010; 121: B716-R678** Aortic Atherosclerosis (ICD10-I70.0). Electronically Signed   By: Elon Alas M.D.   On: 10/03/2018 22:35   Nm Pulmonary Perf And Vent  Result Date: 10/03/2018 CLINICAL DATA:  Elevated D-dimer question pulmonary embolism, worsening shortness of breath, known history of CHF, COPD, essential hypertension, type II diabetes mellitus EXAM: NUCLEAR MEDICINE VENTILATION - PERFUSION LUNG SCAN TECHNIQUE: Ventilation images were obtained in multiple projections using inhaled aerosol Tc-88m DTPA. Perfusion images were obtained in multiple projections after intravenous injection of Tc-75m MAA. RADIOPHARMACEUTICALS:  30 mCi of Tc-58m DTPA aerosol inhalation and 4 mCi Tc84m MAA IV COMPARISON:  None Correlation: Chest radiograph 10/02/2018 FINDINGS: Ventilation: Central airway deposition of aerosol. Diminished ventilation in LEFT lower lobe. Enlargement of cardiac silhouette. Perfusion: Diminished perfusion in LEFT lower lobe though matching the ventilation defect. Single small subsegmental perfusion defect at superior segment  RIGHT lower lobe. No additional perfusion defects identified. Enlargement of cardiac silhouette. Chest radiograph: Enlargement of cardiac silhouette post CABG with bibasilar atelectasis greater on LEFT. IMPRESSION: Cardiomegaly. Diminished ventilation and perfusion in LEFT lower lobe with matching radiographic opacity in the LEFT lower lobe on chest radiograph of 10/02/2018. Large triple matched lower lobe abnormalities represent an intermediate probability for pulmonary embolism. Electronically Signed   By: Lavonia Dana M.D.   On: 10/03/2018 14:12        Scheduled Meds: . citalopram  20 mg Oral Daily  . doxycycline  100 mg Oral Q12H  . enoxaparin (LOVENOX) injection  40 mg Subcutaneous Q24H  . gabapentin  300 mg Oral QID  . guaiFENesin  600 mg Oral BID  . Influenza vac split quadrivalent PF  0.5 mL Intramuscular Tomorrow-1000  . insulin aspart  0-15 Units Subcutaneous TID WC  . insulin aspart  0-5 Units Subcutaneous QHS  . insulin aspart  6 Units Subcutaneous TID WC  .  insulin glargine  25 Units Subcutaneous BID  . mometasone-formoterol  2 puff Inhalation BID  . pantoprazole  40 mg Oral Daily  . sucralfate  1 g Oral TID WC & HS  . umeclidinium bromide  1 puff Inhalation Daily   Continuous Infusions: . 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 He possibly had mild fluid component on presentation as well given elevated BNP and complaints of leg swellings.  Patient needed BiPAP initially.  Currently on 3 L nasal cannula and saturating well.Echocardiogram no significant abnormalities, normal EF and diastolic function.   Continue on bronchodilator therapy.  Steroids now discontinued due to uncontrolled  hyperglycemia and diuretics discontinued due to AKI. Continue inhaled corticosteroids, Incruse Ellipta and nebs.  Initially received Zosyn for suspected aspiration pneumonia, antibiotics de-escalated to doxycycline for bronchitis.No evidence of pneumonia on the CT scan.  Flu negative.  Blood cultures negative.  Steroid-induced leukocytosis now resolved.  Given elevated d-dimer on presentation VQ scan was obtained which showed intermediate probability for PE.  This was followed by CTA chest which ruled out PE.   Acute on chronic diastolic CHF: He possibly had mild fluid component on presentation as well given elevated BNP and complaints of leg swellings.  Patient needed BiPAP initially.  Currently on 3 L nasal cannula and saturating well.Echocardiogram no significant abnormalities, normal EF and diastolic function.  He received IV diuretics on admission which was subsequently held in concern for renal function.  DC IV fluids.  Will prescribe Lasix to be used as needed upon discharge.  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  He states he follows Dr. Arita Miss with vascular surgery who had seen him previously for an aneurysm in his groin.  Will need annual imaging follow-up by CT or MRA.  Uncontrolled hyperglycemia around 500 on admission: Patient is off insulin drip, on home Lantus, and sliding scale, resume pre-meal insulin.  Blood sugar improving.   Hemoglobin A1c is at 9.8.  Mild AKI: creat up at 1.4 in the setting of diuresis.  Improved now to 1.1.  Will DC IV fluids and concern for leg swellings  and to avoid pulmonary edema.  Will prescribe Lasix as needed upon discharge  Lactic acidosis suspect this is multifactorial secondary respiratory distress/neb treatments, uncontrolled hyperglycemia: Lactate on admission 5.2 and down trended to 3.0.  Normocytic anemia with history of chronic GI bleeding, hemoglobin is stable in 10 to  11 g.  No complaints of active bleeding. FOBT was negative.  Anxiety disorder: Continue his Celexa and as needed Xanax.  IHK:[id number].  Resume home medications?  Not on aspirin or beta-blockers     DVT prophylaxis: Lovenox Code Status: Full code Family / Patient Communication: Discussed with patient at length and answered all his questions.  Discussed with patient's primary pulmonologist Dr. Lake Bells who agreed with above assessment and plan.  They will plan to follow-up with patient in 10 days. Disposition Plan: Home in a.m.     LOS: 3 days    Time spent: 45 minutes    Guilford Shi, MD [facility] Pager [phone]  If 7PM-7AM, please contact night-coverage [email or web address] Password [email or web address] 10/05/2018, 1:11 PM

## 2018-10-05 NOTE — Progress Notes (Signed)
Patient Saturations on 3 Liters of oxygen while Ambulating = 88% , but resting 94-95%  Please briefly explain why patient needs home oxygen: Patient need O2 4 liters of oxygen while ambulating = 93%. Patient needs frequent resting. HS [employer]

## 2018-10-05 NOTE — Telephone Encounter (Signed)
Spoke with BQ and he will take the call to speak to the physician. Will route this over to BQ.

## 2018-10-06 LAB — BASIC METABOLIC PANEL WITH GFR
Anion gap: 10 (ref 5–15)
BUN: 15 mg/dL (ref 8–23)
CO2: 31 mmol/L (ref 22–32)
Calcium: 8.7 mg/dL — ABNORMAL LOW (ref 8.9–10.3)
Chloride: 99 mmol/L (ref 98–111)
Creatinine, Ser: 1.17 mg/dL (ref 0.61–1.24)
GFR calc Af Amer: >60 mL/min
GFR calc non Af Amer: >60 mL/min
Glucose, Bld: 290 mg/dL — ABNORMAL HIGH (ref 70–99)
Potassium: 4 mmol/L (ref 3.5–5.1)
Sodium: 140 mmol/L (ref 135–145)

## 2018-10-06 LAB — GLUCOSE, CAPILLARY
Glucose-Capillary: 216 mg/dL — ABNORMAL HIGH (ref 70–99)
Glucose-Capillary: 303 mg/dL — ABNORMAL HIGH (ref 70–99)

## 2018-10-06 LAB — STREP PNEUMONIAE URINARY ANTIGEN: STREP PNEUMO URINARY ANTIGEN: NEGATIVE

## 2018-10-06 MED ORDER — DILTIAZEM HCL ER COATED BEADS 120 MG PO CP24: 240.0000 mg | ORAL_CAPSULE | Freq: Every day | ORAL | 1 refills | Status: DC

## 2018-10-06 MED ORDER — TIOTROPIUM BROMIDE MONOHYDRATE 18 MCG IN CAPS: 18.0000 ug | ORAL_CAPSULE | Freq: Every day | RESPIRATORY_TRACT | 12 refills | Status: DC | PRN

## 2018-10-06 MED ORDER — GUAIFENESIN-DM 100-10 MG/5ML PO SYRP: 10.0000 mL | ORAL_SOLUTION | ORAL | 0 refills | Status: DC | PRN

## 2018-10-06 MED ORDER — ASPIRIN EC 81 MG PO TBEC
81.0000 mg | DELAYED_RELEASE_TABLET | Freq: Every day | ORAL | Status: DC
  Administered 2018-10-06: 81 mg via ORAL
  Filled 2018-10-06: qty 1

## 2018-10-06 MED ORDER — FUROSEMIDE 20 MG PO TABS: 20.0000 mg | ORAL_TABLET | Freq: Every day | ORAL | 1 refills | Status: DC | PRN

## 2018-10-06 MED ORDER — ASPIRIN 81 MG PO TBEC: 81.0000 mg | DELAYED_RELEASE_TABLET | Freq: Every day | ORAL | 1 refills | Status: DC

## 2018-10-06 MED ORDER — DOXYCYCLINE HYCLATE 100 MG PO TABS: 100.0000 mg | ORAL_TABLET | Freq: Two times a day (BID) | ORAL | 0 refills | Status: AC

## 2018-10-06 MED ORDER — INSULIN GLARGINE 100 UNIT/ML ~~LOC~~ SOLN: 25.0000 [IU] | Freq: Two times a day (BID) | SUBCUTANEOUS | 1 refills | Status: DC

## 2018-10-06 NOTE — Discharge Summary (Signed)
Physician Discharge Summary  [name] QIH:[id number] DOB: March 26, 1941 DOA: 10/02/2018  PCP: Howard Dy, MD  Admit date: 10/02/2018 Discharge date: 10/06/2018 Consultations: None. D/W primary Pulmonologist [name] over the phone Admitted From: home Disposition:  home  Discharge Diagnoses:  Principal Problem:   Acute on chronic diastolic CHF (congestive heart failure) ([place]) Active Problems:   COPD (chronic obstructive pulmonary disease) (HCC)   CAD (coronary artery disease)   Chronic GI bleeding   Acute on chronic respiratory failure with hypoxia and hypercapnia (HCC)   Anxiety state   Iron deficiency anemia due to chronic blood loss   Acute respiratory failure (HCC)   Brief/Interim Summary: 77 year old male with history of COPD, chronic hypoxic and hypercapnic respiratory failure on home 02 4-7 lits [place], chronic diastolic CHF, CAD insulin-dependent diabetes, chronic GI bleeding, anxiety presented to the ER 12/17 with worsening shortness of breath for more than a week with bilateral lower extremity swelling, orthopnea, worsening of chronic cough. In the ER patient was markedly hyperglycemic, BNP was mildly up at 230, chest x-ray showed "Stable cardiomegaly. Increasing interstitial prominence most consistent with pulmonary edema. Small LEFT pleural effusion.RIGHT lung base atelectasis/scarring,Emphysema". Patient required BiPAP on presentation, was initially diuresed, given Solu-Medrol/ nebs and was admitted for further evaluation and management.  Patient follows Howard. [place] [name] with pulmonary as outpatient and was apparently advised to use 4 L continuous O2 but he was using 7 L for symptom relief.  He has not seen pulmonary for more than a year now but uses albuterol/spiriva/Symbicort inhaler daily.  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 He possibly had mild fluid component on presentation as well given elevated BNP and complaints of leg swellings.  Patient needed BiPAP initially. He was treated with empiric antibiotics , steroids and diuretics. Currently saturating well on 3-4 L nasal cannula and maintaining sats >93% on exertion as well.Echocardiogram no significant abnormalities, normal EF and diastolic function.  Continue on bronchodilator/steroid inhaler therapy upon discharge. Initially received Zosyn for suspected aspiration pneumonia, antibiotics de-escalated to doxycycline for bronchitis.No evidence of pneumonia on the CT scan.  Flu negative.  Blood cultures negative. Antibiotics de-escalated to doxycycline and today is Day 3/5. Steroids now discontinued due to uncontrolled hyperglycemia and diuretics discontinued due to AKI. Steroid-induced leukocytosis now resolved as well.  Given elevated d-dimer on presentation VQ scan was obtained which showed intermediate probability for PE.  This was followed by CTA chest which ruled out PE.   Acute on chronic diastolic CHF: He possibly had mild fluid component on presentation as well given elevated BNP and complaints of leg swellings.  Patient needed BiPAP initially. Currently on 3 L nasal cannula and saturating well.Echocardiogram no significant abnormalities, normal EF and diastolic function.  He received IV diuretics on admission which was subsequently held in concern for renal function and in fact received IV fluids subsequently ( d/ced on 12/19).  Will advise/prescribe Lasix to be used as needed upon discharge for leg swellings/weight gain.  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  He states he follows Howard. Arita [name] with vascular surgery who had seen him previously for an aneurysm in his groin.  Will need annual  imaging follow-up by CT or MRA to be set up through PCP.  Uncontrolled hyperglycemia around 500 on admission:Patient is off  insulin drip, on Lantus 25 U BID (was on 20 units BID before), and sliding scale.Blood sugar now improved.  Hemoglobin A1c is at 9.8.  Mild OJJ:KKXFG up at1.4 in the setting of diuresis.  Improved now to 1.1. DCed IV fluids and concern for leg swellings and to avoid pulmonary edema.  Will prescribe Lasix as needed upon discharge  Lactic acidosissuspect this is multifactorial secondary respiratory distress/neb treatments, uncontrolled hyperglycemia: Lactate on admission 5.2 anddown trendedto3.0.  Normocytic anemia with history of chronic GI bleeding, hemoglobin is stable in 10 to 11 g.  No complaints of active bleeding.FOBT was negative.  Anxiety disorder: Continue his Celexa and as needed Xanax.  HWE:[id number].  Resume home medications?  Not on aspirin. Not on beta-blockers ?due to emphysema. Will add cardizem as BP also uncontrolled  Discussed with patient at length and answered all his questions. Left a message for wife ([name]) at [phone].   Discussed with patient's primary pulmonologist Howard. [place] [name] who knows patient well and agreed with above assessment and plan.  They will plan to follow-up with patient in 10 days.  Discharge Exam: Vitals:   10/05/18 2323 10/06/18 0854  BP: (!) 148/80   Pulse: 99   Resp: (!) 25   Temp: 97.7 F (36.5 C)   SpO2: 94% 95%   Vitals:   10/05/18 2100 10/05/18 2323 10/06/18 0444 10/06/18 0854  BP:  (!) 148/80    Pulse:  99    Resp:  (!) 25    Temp:  97.7 F (36.5 C)    TempSrc:  Oral    SpO2: 94% 94%  95%  Weight:   123.3 kg   Height:        General: Pt is alert, awake, not in acute distress Cardiovascular: RRR, S1/S2 +, no rubs, no gallops Respiratory: CTA bilaterally, no wheezing, no rhonchi Abdominal: Soft, NT, ND, bowel sounds + Extremities: no edema, no cyanosis  Discharge  Instructions  Discharge Instructions    Call MD for:  difficulty breathing, headache or visual disturbances   Complete by:  As directed    Call MD for:  extreme fatigue   Complete by:  As directed    Call MD for:  persistant nausea and vomiting   Complete by:  As directed    Call MD for:  severe uncontrolled pain   Complete by:  As directed    Call MD for:  temperature >100.4   Complete by:  As directed    Diet - low sodium heart healthy   Complete by:  As directed    Increase activity slowly   Complete by:  As directed      Allergies as of 10/06/2018      Reactions   Morphine And Related Shortness Of Breath, Nausea And Vomiting, Rash, Other (See Comments)   Reaction:  Hallucinations    Penicillins Anaphylaxis, Hives, Other (See Comments)   10/02/18 - discussed with patient and he states he can tolerate penicillin capsules and had some hives when he was 77yo and received pcn injections Has patient had a PCN reaction causing immediate rash, facial/tongue/throat swelling, SOB or lightheadedness with hypotension: Yes Has patient had a PCN reaction causing severe rash involving mucus membranes or skin necrosis: No Has patient had a PCN reaction that required hospitalization No Has patient had a PCN reaction occurring within the last 10 y   Zolpidem Shortness Of Breath   Demerol [meperidine] Other (See Comments)   Reaction:  Hallucinations  Dilaudid [hydromorphone Hcl] Other (See Comments)   Reaction:  Hallucinations    Levofloxacin Other (See Comments)   Reaction:  Unknown       Medication List    STOP taking these medications   OXYGEN     TAKE these medications   acetaminophen 325 MG tablet Commonly known as:  TYLENOL Take 650 mg by mouth every 6 (six) hours as needed for mild pain, fever or headache.   albuterol 108 (90 Base) MCG/ACT inhaler Commonly known as:  PROVENTIL HFA;VENTOLIN HFA Inhale 2 puffs into the lungs every 6 (six) hours as needed for wheezing or  shortness of breath.   ALPRAZolam 0.25 MG tablet Commonly known as:  XANAX Take 1 tablet (0.25 mg total) by mouth 3 (three) times daily as needed for anxiety or sleep.   aspirin 81 MG EC tablet Take 1 tablet (81 mg total) by mouth daily.   citalopram 40 MG tablet Commonly known as:  CELEXA Take 0.5 tablets (20 mg total) by mouth daily.   diltiazem 120 MG 24 hr capsule Commonly known as:  CARTIA XT Take 2 capsules (240 mg total) by mouth daily.   doxycycline 100 MG tablet Commonly known as:  VIBRA-TABS Take 1 tablet (100 mg total) by mouth every 12 (twelve) hours for 3 days.   ferrous sulfate 325 (65 FE) MG tablet Take 1 tablet (325 mg total) by mouth 3 (three) times daily with meals.   folic acid 1 MG tablet Commonly known as:  FOLVITE Take 1 mg by mouth daily.   furosemide 20 MG tablet Commonly known as:  LASIX Take 1 tablet (20 mg total) by mouth daily as needed for edema.   gabapentin 300 MG capsule Commonly known as:  NEURONTIN Take 1 capsule (300 mg total) by mouth 3 (three) times daily for 15 days. What changed:  when to take this   guaiFENesin-dextromethorphan 100-10 MG/5ML syrup Commonly known as:  ROBITUSSIN DM Take 10 mLs by mouth every 4 (four) hours as needed for cough.   insulin aspart 100 UNIT/ML injection Commonly known as:  novoLOG Inject 6 Units into the skin 3 (three) times daily with meals. What changed:    how much to take  when to take this  reasons to take this   insulin glargine 100 UNIT/ML injection Commonly known as:  LANTUS Inject 0.25 mLs (25 Units total) into the skin 2 (two) times daily. What changed:  how much to take   pantoprazole 40 MG tablet Commonly known as:  PROTONIX Take 1 tablet (40 mg total) by mouth daily.   sucralfate 1 g tablet Commonly known as:  CARAFATE Take 1 tablet (1 g total) by mouth 4 (four) times daily -  with meals and at bedtime.   SYMBICORT 160-4.5 MCG/ACT inhaler Generic drug:   budesonide-formoterol Inhale 2 puffs into the lungs 2 (two) times daily.   tiotropium 18 MCG inhalation capsule Commonly known as:  SPIRIVA Place 1 capsule (18 mcg total) into inhaler and inhale daily as needed (shortness of breath).   vitamin B-12 1000 MCG tablet Commonly known as:  CYANOCOBALAMIN Take 1,000 mcg by mouth daily.            Durable Medical Equipment  (From admission, onward)         Start     Ordered   10/06/18 0835  DME Oxygen  Once    Question Answer Comment  Mode or (Route) Nasal cannula   Liters per Minute 4  Frequency Continuous (stationary and portable oxygen unit needed)   Oxygen conserving device Yes   Oxygen delivery system Gas      10/06/18 [facility] Follow up.   Specialty:  Home Health Services Why:  Physcial and occupational therapy, registered nurse and nurse aide Contact information: [place] [address] [phone]          Allergies  Allergen Reactions  . Morphine And Related Shortness Of Breath, Nausea And Vomiting, Rash and Other (See Comments)    Reaction:  Hallucinations   . Penicillins Anaphylaxis, Hives and Other (See Comments)    10/02/18 - discussed with patient and he states he can tolerate penicillin capsules and had some hives when he was 77yo and received pcn injections  Has patient had a PCN reaction causing immediate rash, facial/tongue/throat swelling, SOB or lightheadedness with hypotension: Yes Has patient had a PCN reaction causing severe rash involving mucus membranes or skin necrosis: No Has patient had a PCN reaction that required hospitalization No Has patient had a PCN reaction occurring within the last 10 y  . Zolpidem Shortness Of Breath  . Demerol [Meperidine] Other (See Comments)    Reaction:  Hallucinations    . Dilaudid [Hydromorphone Hcl] Other (See Comments)    Reaction:  Hallucinations   .  Levofloxacin Other (See Comments)    Reaction:  Unknown        Discharge Condition: stable CODE STATUS: Full code Diet recommendation: diabetic, low salt, low fat diet Recommendations for Outpatient Follow-up:  1. Follow up with PCP in 1 week and Pulmonary [name] in 10 days 2. Please obtain BMP/CBC in one week 3. Please schedule f/u MRA/CTA for Ascending aortic aneurysm  Home Health: Yes Equipment/Devices: resume home 02     The results of significant diagnostics from this hospitalization (including imaging, microbiology, ancillary and laboratory) are listed below for reference.     Microbiology: Recent Results (from the past 240 hour(s))  MRSA PCR Screening     Status: None   Collection Time: 10/02/18  8:12 PM  Result Value Ref Range Status   MRSA by PCR NEGATIVE NEGATIVE Final    Comment:        The GeneXpert MRSA Assay (FDA approved for NASAL specimens only), is one component of a comprehensive MRSA colonization surveillance program. It is not intended to diagnose MRSA infection nor to guide or monitor treatment for MRSA infections. Performed at [facility], [place] [address], [place], [place] [address]   Culture, blood (routine x 2) Call MD if unable to obtain prior to antibiotics being given     Status: None (Preliminary result)   Collection Time: 10/02/18 10:00 PM  Result Value Ref Range Status   Specimen Description BLOOD RIGHT HAND  Final   Special Requests   Final    BOTTLES DRAWN AEROBIC ONLY Blood Culture adequate volume   Culture   Final    NO GROWTH 3 DAYS Performed at [facility], [place] [address] [place], [place] [address]    Report Status PENDING  Incomplete  Culture, blood (routine x 2) Call MD if unable to obtain prior to antibiotics being given     Status: None (Preliminary result)   Collection Time: 10/02/18 10:10 PM  Result Value Ref Range Status   Specimen Description BLOOD RIGHT FINGER  Final   Special  Requests   Final     BOTTLES DRAWN AEROBIC ONLY Blood Culture adequate volume   Culture   Final    NO GROWTH 3 DAYS Performed at [facility], [place] [address], [place], [place] [address]    Report Status PENDING  Incomplete     Labs: BNP (last 3 results) Recent Labs    01/28/18 0242 02/08/18 0323 10/02/18 0051  BNP 54.0 54.2 151.7*   Basic Metabolic Panel: Recent Labs  Lab 10/02/18 0051 10/02/18 0108 10/03/18 0119 10/04/18 0159 10/05/18 0315 10/06/18 0135  NA 137 136 140 137 140 140  K 4.2 4.2 4.5 3.9 3.7 4.0  CL 94* 95* 92* 95* 99 99  CO2 32  --  33* 33* 30 31  GLUCOSE 500* 515* 110* 349* 233* 290*  BUN 12 14 27* 29* 18 15  CREATININE 1.15 1.00 1.48* 1.40* 1.18 1.17  CALCIUM 8.5*  --  9.3 8.3* 8.4* 8.7*  MG  --   --  2.2  --   --   --    Liver Function Tests: Recent Labs  Lab 10/02/18 0051 10/03/18 0119  AST 19 26  ALT 14 15  ALKPHOS 121 108  BILITOT 0.6 0.5  PROT 6.7 7.3  ALBUMIN 3.1* 3.5   No results for input(s): LIPASE, AMYLASE in the last 168 hours. No results for input(s): AMMONIA in the last 168 hours. CBC: Recent Labs  Lab 10/02/18 0051 10/02/18 0108 10/03/18 0119 10/04/18 0159  WBC 6.5  --  16.8* 6.6  NEUTROABS 4.2  --   --   --   HGB 10.2* 11.2* 10.9* 10.2*  HCT 35.7* 33.0* 36.7* 34.0*  MCV 95.2  --  91.3 92.9  PLT 173  --  238 202   Cardiac Enzymes: No results for input(s): CKTOTAL, CKMB, CKMBINDEX, TROPONINI in the last 168 hours. BNP: Invalid input(s): POCBNP CBG: Recent Labs  Lab 10/05/18 0756 10/05/18 1149 10/05/18 1649 10/05/18 2023 10/06/18 0817  GLUCAP 223* 275* 155* 204* 216*   D-Dimer No results for input(s): DDIMER in the last 72 hours. Hgb A1c Recent Labs    10/04/18 0159  HGBA1C 9.8*   Lipid Profile No results for input(s): CHOL, HDL, LDLCALC, TRIG, CHOLHDL, LDLDIRECT in the last 72 hours. Thyroid function studies No results for input(s): TSH, T4TOTAL, T3FREE, THYROIDAB in the last 72 hours.  Invalid input(s):  FREET3 Anemia work up No results for input(s): VITAMINB12, FOLATE, FERRITIN, TIBC, IRON, RETICCTPCT in the last 72 hours. Urinalysis    Component Value Date/Time   COLORURINE YELLOW 05/08/2017 [place] 05/08/2017 0614   LABSPEC 1.015 05/08/2017 0614   PHURINE 5.0 05/08/2017 0614   GLUCOSEU NEGATIVE 05/08/2017 0614   HGBUR NEGATIVE 05/08/2017 0614   BILIRUBINUR NEGATIVE 05/08/2017 0614   BILIRUBINUR small 02/13/2015 1354   KETONESUR NEGATIVE 05/08/2017 0614   PROTEINUR NEGATIVE 05/08/2017 0614   UROBILINOGEN 0.2 02/13/2015 1354   UROBILINOGEN 0.2 08/24/2014 1948   NITRITE NEGATIVE 05/08/2017 0614   LEUKOCYTESUR NEGATIVE 05/08/2017 6160   Sepsis Labs Invalid input(s): PROCALCITONIN,  WBC,  LACTICIDVEN Microbiology Recent Results (from the past 240 hour(s))  MRSA PCR Screening     Status: None   Collection Time: 10/02/18  8:12 PM  Result Value Ref Range Status   MRSA by PCR NEGATIVE NEGATIVE Final    Comment:        The GeneXpert MRSA Assay (FDA approved for NASAL specimens only), is one component of a comprehensive MRSA colonization  surveillance program. It is not intended to diagnose MRSA infection nor to guide or monitor treatment for MRSA infections. Performed at [facility], [place] [address], [place], [place] [address]   Culture, blood (routine x 2) Call MD if unable to obtain prior to antibiotics being given     Status: None (Preliminary result)   Collection Time: 10/02/18 10:00 PM  Result Value Ref Range Status   Specimen Description BLOOD RIGHT HAND  Final   Special Requests   Final    BOTTLES DRAWN AEROBIC ONLY Blood Culture adequate volume   Culture   Final    NO GROWTH 3 DAYS Performed at [facility], [place] [address] [place], [place] [address]    Report Status PENDING  Incomplete  Culture, blood (routine x 2) Call MD if unable to obtain prior to antibiotics being given     Status: None (Preliminary result)   Collection Time:  10/02/18 10:10 PM  Result Value Ref Range Status   Specimen Description BLOOD RIGHT FINGER  Final   Special Requests   Final    BOTTLES DRAWN AEROBIC ONLY Blood Culture adequate volume   Culture   Final    NO GROWTH 3 DAYS Performed at [facility], [address], [place], [place] [address]    Report Status PENDING  Incomplete    Procedures/Studies: Ct Angio Chest Pe W Or Wo Contrast  Result Date: 10/03/2018 CLINICAL DATA:  Hypoxia and shortness of breath. Abnormal V/Q examination. EXAM: CT ANGIOGRAPHY CHEST WITH CONTRAST TECHNIQUE: Multidetector CT imaging of the chest was performed using the standard protocol during bolus administration of intravenous contrast. Multiplanar CT image reconstructions and MIPs were obtained to evaluate the vascular anatomy. CONTRAST:  144mL ISOVUE-370 IOPAMIDOL (ISOVUE-370) INJECTION 76% COMPARISON:  V/Q chest October 03, 2018 and chest radiograph October 02, 2018 and CT chest May 08, 2018 FINDINGS: CARDIOVASCULAR: Adequate contrast opacification of the pulmonary artery's. Main pulmonary artery is not enlarged. No pulmonary arterial filling defects to the level of the subsegmental branches. The heart is upper limits of normal in size. No pericardial effusion. Moderate coronary artery calcifications. No pericardial effusion. 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 Mild calcific atherosclerosis, 2 vessel arch is a normal variant. MEDIASTINUM/NODES: No lymphadenopathy by CT size criteria. Prominent mediastinal fat. LUNGS/PLEURA: Tracheobronchial tree is patent, no pneumothorax. Bronchial wall thickening. 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 Bilateral lower lobe atelectasis, pleural fat extending into RIGHT major fissure. LEFT upper lobe atelectasis versus scarring. LEFT apical bulla. No pleural effusion. UPPER ABDOMEN: Non-acute.  Large hiatal hernia. MUSCULOSKELETAL: Non-acute. Status  post median sternotomy. 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 Review of the MIP images confirms the above findings. IMPRESSION: 1. No acute pulmonary embolism. 2. 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 Scattered atelectasis. 3. Large hiatal hernia. 4. **An incidental finding of potential clinical significance has been found. 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 Recommend annual imaging followup by CTA or MRA. This recommendation follows 2010 ACCF/AHA/AATS/ACR/ASA/SCA/SCAI/SIR/STS/SVM Guidelines for the Diagnosis and Management of Patients with Thoracic Aortic Disease. Circulation. 2010; 121: S063-K160** Aortic Atherosclerosis (ICD10-I70.0). Electronically Signed   By: Elon Alas M.D.   On: 10/03/2018 22:35   Nm Pulmonary Perf And Vent  Result Date: 10/03/2018 CLINICAL DATA:  Elevated D-dimer question pulmonary embolism, worsening shortness of breath, known history of CHF, COPD, essential hypertension, type II diabetes mellitus EXAM: NUCLEAR MEDICINE VENTILATION - PERFUSION  LUNG SCAN TECHNIQUE: Ventilation images were obtained in multiple projections using inhaled aerosol Tc-57m DTPA. Perfusion images were obtained in multiple projections after intravenous injection of Tc-59m MAA. RADIOPHARMACEUTICALS:  30 mCi of Tc-53m DTPA aerosol inhalation and 4 mCi Tc86m MAA IV COMPARISON:  None Correlation: Chest radiograph 10/02/2018 FINDINGS: Ventilation: Central airway deposition of aerosol. Diminished ventilation in LEFT lower lobe. Enlargement of cardiac silhouette. Perfusion: Diminished perfusion in LEFT lower lobe though matching the ventilation defect. Single small subsegmental perfusion defect at superior segment RIGHT lower lobe. No additional perfusion defects identified. Enlargement of cardiac silhouette. Chest radiograph: Enlargement of cardiac silhouette post CABG with bibasilar atelectasis greater on LEFT. IMPRESSION:  Cardiomegaly. Diminished ventilation and perfusion in LEFT lower lobe with matching radiographic opacity in the LEFT lower lobe on chest radiograph of 10/02/2018. Large triple matched lower lobe abnormalities represent an intermediate probability for pulmonary embolism. Electronically Signed   By: Lavonia Dana M.D.   On: 10/03/2018 14:12   Dg Chest Portable 1 View  Result Date: 10/02/2018 CLINICAL DATA:  Shortness of breath today. History of cardiac surgery, hypertension. EXAM: PORTABLE CHEST 1 VIEW COMPARISON:  Chest radiograph February 08, 2018 FINDINGS: Stable cardiomegaly. Status post median sternotomy for CABG. Calcified aortic arch. Pulmonary vascular congestion and increased interstitial prominence. RIGHT lung base bandlike density. LEFT lung base out of field of view, small LEFT pleural effusion. Biapical bullous changes. No pneumothorax. Osseous structures are non suspicious. IMPRESSION: 1. Stable cardiomegaly. Increasing interstitial prominence most consistent with pulmonary edema. Small LEFT pleural effusion. 2. RIGHT lung base atelectasis/scarring. 3.  Emphysema (ICD10-J43.9).  Aortic Atherosclerosis (ICD10-I70.0). Electronically Signed   By: Elon Alas M.D.   On: 10/02/2018 01:42   Vas [place] Lower Extremity Venous (dvt)  Result Date: 10/02/2018  Lower Venous Study Indications: Swelling, Edema, hypoxia, and SOB. Other Indications: CHF, Chronic GI bleeding. Limitations: Body habitus. Performing Technologist: Rudell Cobb  Examination Guidelines: A complete evaluation includes B-mode imaging, spectral Doppler, color Doppler, and power Doppler as needed of all accessible portions of each vessel. Bilateral testing is considered an integral part of a complete examination. Limited examinations for reoccurring indications may be performed as noted.  Right Venous Findings: +---------+---------------+---------+-----------+----------+-------------------+           CompressibilityPhasicitySpontaneityPropertiesSummary             +---------+---------------+---------+-----------+----------+-------------------+ CFV      Full           Yes      Yes                                      +---------+---------------+---------+-----------+----------+-------------------+ SFJ      Full                                                             +---------+---------------+---------+-----------+----------+-------------------+ FV Prox  Full                                                             +---------+---------------+---------+-----------+----------+-------------------+ FV Mid   Full                                                             +---------+---------------+---------+-----------+----------+-------------------+  FV DistalFull                                                             +---------+---------------+---------+-----------+----------+-------------------+ PFV      Full                                                             +---------+---------------+---------+-----------+----------+-------------------+ POP      Full           Yes      Yes                                      +---------+---------------+---------+-----------+----------+-------------------+ PTV                                                   not well visualized +---------+---------------+---------+-----------+----------+-------------------+ PERO                                                  not well visualized +---------+---------------+---------+-----------+----------+-------------------+  Left Venous Findings: +---+---------------+---------+-----------+----------+-------+    CompressibilityPhasicitySpontaneityPropertiesSummary +---+---------------+---------+-----------+----------+-------+ CFVFull           Yes      Yes                          +---+---------------+---------+-----------+----------+-------+     Summary: Right: There is no evidence of deep vein thrombosis in the lower extremity. However, portions of this examination were limited- see technologist comments above. No cystic structure found in the popliteal fossa. Left: No evidence of common femoral vein obstruction.  *See table(s) above for measurements and observations. Electronically signed by Deitra Mayo MD on 10/02/2018 at 4:35:58 PM.    Final    (Echo, Carotid, EGD, Colonoscopy, ERCP)  Time coordinating discharge: Over 30 minutes  SIGNED:   Guilford Shi, MD  [facility] 10/06/2018, 8:57 AM Pager   If 7PM-7AM, please contact night-coverage [email or web address] Password [email or web address]

## 2018-10-06 NOTE — Plan of Care (Signed)
  Problem: General Medical Path Diagnosis Goal: Blood glucose control Description Maintain plasma glucose levels within expected range.   Outcome: Adequate for Discharge   Problem: Education: Goal: Knowledge of General Education information will improve Description Including pain rating scale, medication(s)/side effects and non-pharmacologic comfort measures Outcome: Adequate for Discharge   Problem: Health Behavior/Discharge Planning: Goal: Ability to manage health-related needs will improve Outcome: Adequate for Discharge   Problem: Clinical Measurements: Goal: Ability to maintain clinical measurements within normal limits will improve Outcome: Adequate for Discharge Goal: Will remain free from infection Outcome: Adequate for Discharge Goal: Diagnostic test results will improve Outcome: Adequate for Discharge Goal: Respiratory complications will improve Outcome: Adequate for Discharge Goal: Cardiovascular complication will be avoided Outcome: Adequate for Discharge   Problem: Activity: Goal: Risk for activity intolerance will decrease Outcome: Adequate for Discharge   Problem: Nutrition: Goal: Adequate nutrition will be maintained Outcome: Adequate for Discharge   Problem: Coping: Goal: Level of anxiety will decrease Outcome: Adequate for Discharge   Problem: Elimination: Goal: Will not experience complications related to bowel motility Outcome: Adequate for Discharge Goal: Will not experience complications related to urinary retention Outcome: Adequate for Discharge   Problem: Pain Managment: Goal: General experience of comfort will improve Outcome: Adequate for Discharge   Problem: Safety: Goal: Ability to remain free from injury will improve Outcome: Adequate for Discharge   Problem: Skin Integrity: Goal: Risk for impaired skin integrity will decrease Outcome: Adequate for Discharge

## 2018-10-06 NOTE — Progress Notes (Signed)
Spoke to patient at bedside. He confirms hat he has O2 at home. He confirms that his wife will be providing transport home and that she will be bringing his portable tank for the ride home. Notified [facility] that patient will be discharging today. Patient updated. No other CM needs identified.  Carles Collet RN BSN CPN Case Management [phone] Please refer to [facility] for CM provider on call

## 2018-10-06 NOTE — Progress Notes (Signed)
Discharge instructions given to patient by nurse. Pt understood all instructions along with follow up appointments. IV's were taken out and intact.

## 2018-10-07 LAB — CULTURE, BLOOD (ROUTINE X 2)
Culture: NO GROWTH
Culture: NO GROWTH
SPECIAL REQUESTS: ADEQUATE
Special Requests: ADEQUATE

## 2018-10-08 LAB — LEGIONELLA PNEUMOPHILA SEROGP 1 UR AG: L. pneumophila Serogp 1 Ur Ag: NEGATIVE

## 2018-10-11 ENCOUNTER — Encounter (HOSPITAL_COMMUNITY): Payer: Self-pay

## 2018-10-11 ENCOUNTER — Emergency Department (HOSPITAL_COMMUNITY): Payer: Medicare HMO

## 2018-10-11 ENCOUNTER — Inpatient Hospital Stay (HOSPITAL_COMMUNITY)
Admission: EM | Admit: 2018-10-11 | Discharge: 2018-10-14 | DRG: 291 | Disposition: A | Discharge status: Home Health | Payer: Medicare HMO | Attending: Family Medicine | Admitting: Family Medicine

## 2018-10-11 DIAGNOSIS — Z87892 Personal history of anaphylaxis: Secondary | ICD-10-CM

## 2018-10-11 DIAGNOSIS — I252 Old myocardial infarction: Secondary | ICD-10-CM

## 2018-10-11 DIAGNOSIS — Z8701 Personal history of pneumonia (recurrent): Secondary | ICD-10-CM

## 2018-10-11 DIAGNOSIS — E118 Type 2 diabetes mellitus with unspecified complications: Secondary | ICD-10-CM | POA: Diagnosis present

## 2018-10-11 DIAGNOSIS — K219 Gastro-esophageal reflux disease without esophagitis: Secondary | ICD-10-CM | POA: Diagnosis present

## 2018-10-11 DIAGNOSIS — F3162 Bipolar disorder, current episode mixed, moderate: Secondary | ICD-10-CM | POA: Diagnosis present

## 2018-10-11 DIAGNOSIS — Z881 Allergy status to other antibiotic agents status: Secondary | ICD-10-CM

## 2018-10-11 DIAGNOSIS — Z8249 Family history of ischemic heart disease and other diseases of the circulatory system: Secondary | ICD-10-CM

## 2018-10-11 DIAGNOSIS — Z79899 Other long term (current) drug therapy: Secondary | ICD-10-CM

## 2018-10-11 DIAGNOSIS — J9622 Acute and chronic respiratory failure with hypercapnia: Secondary | ICD-10-CM | POA: Diagnosis present

## 2018-10-11 DIAGNOSIS — I2510 Atherosclerotic heart disease of native coronary artery without angina pectoris: Secondary | ICD-10-CM | POA: Diagnosis present

## 2018-10-11 DIAGNOSIS — J9621 Acute and chronic respiratory failure with hypoxia: Secondary | ICD-10-CM | POA: Diagnosis not present

## 2018-10-11 DIAGNOSIS — Z833 Family history of diabetes mellitus: Secondary | ICD-10-CM

## 2018-10-11 DIAGNOSIS — Z885 Allergy status to narcotic agent status: Secondary | ICD-10-CM

## 2018-10-11 DIAGNOSIS — Z880 Allergy status to penicillin: Secondary | ICD-10-CM

## 2018-10-11 DIAGNOSIS — I110 Hypertensive heart disease with heart failure: Secondary | ICD-10-CM | POA: Diagnosis not present

## 2018-10-11 DIAGNOSIS — Z9981 Dependence on supplemental oxygen: Secondary | ICD-10-CM

## 2018-10-11 DIAGNOSIS — Z87891 Personal history of nicotine dependence: Secondary | ICD-10-CM

## 2018-10-11 DIAGNOSIS — E1140 Type 2 diabetes mellitus with diabetic neuropathy, unspecified: Secondary | ICD-10-CM | POA: Diagnosis present

## 2018-10-11 DIAGNOSIS — Z6832 Body mass index (BMI) 32.0-32.9, adult: Secondary | ICD-10-CM

## 2018-10-11 DIAGNOSIS — J441 Chronic obstructive pulmonary disease with (acute) exacerbation: Secondary | ICD-10-CM | POA: Diagnosis present

## 2018-10-11 DIAGNOSIS — Z888 Allergy status to other drugs, medicaments and biological substances status: Secondary | ICD-10-CM

## 2018-10-11 DIAGNOSIS — I10 Essential (primary) hypertension: Secondary | ICD-10-CM | POA: Diagnosis present

## 2018-10-11 DIAGNOSIS — K449 Diaphragmatic hernia without obstruction or gangrene: Secondary | ICD-10-CM | POA: Diagnosis present

## 2018-10-11 DIAGNOSIS — Z825 Family history of asthma and other chronic lower respiratory diseases: Secondary | ICD-10-CM

## 2018-10-11 DIAGNOSIS — J449 Chronic obstructive pulmonary disease, unspecified: Secondary | ICD-10-CM | POA: Diagnosis present

## 2018-10-11 DIAGNOSIS — Z7982 Long term (current) use of aspirin: Secondary | ICD-10-CM

## 2018-10-11 DIAGNOSIS — Z794 Long term (current) use of insulin: Secondary | ICD-10-CM

## 2018-10-11 DIAGNOSIS — Z8711 Personal history of peptic ulcer disease: Secondary | ICD-10-CM

## 2018-10-11 DIAGNOSIS — E119 Type 2 diabetes mellitus without complications: Secondary | ICD-10-CM | POA: Diagnosis present

## 2018-10-11 DIAGNOSIS — E785 Hyperlipidemia, unspecified: Secondary | ICD-10-CM | POA: Diagnosis present

## 2018-10-11 DIAGNOSIS — Z951 Presence of aortocoronary bypass graft: Secondary | ICD-10-CM

## 2018-10-11 DIAGNOSIS — I5033 Acute on chronic diastolic (congestive) heart failure: Secondary | ICD-10-CM | POA: Diagnosis present

## 2018-10-11 LAB — CBC WITH DIFFERENTIAL/PLATELET
Abs Immature Granulocytes: 0.24 10*3/uL — ABNORMAL HIGH (ref 0.00–0.07)
Basophils Absolute: 0 10*3/uL (ref 0.0–0.1)
Basophils Relative: 0 %
EOS PCT: 1 %
Eosinophils Absolute: 0.1 10*3/uL (ref 0.0–0.5)
HCT: 37.8 % — ABNORMAL LOW (ref 39.0–52.0)
Hemoglobin: 11 g/dL — ABNORMAL LOW (ref 13.0–17.0)
Immature Granulocytes: 3 %
Lymphocytes Relative: 8 %
Lymphs Abs: 0.7 10*3/uL (ref 0.7–4.0)
MCH: 26.8 pg (ref 26.0–34.0)
MCHC: 29.1 g/dL — AB (ref 30.0–36.0)
MCV: 92.2 fL (ref 80.0–100.0)
MONO ABS: 0.7 10*3/uL (ref 0.1–1.0)
MONOS PCT: 9 %
Neutro Abs: 6.3 10*3/uL (ref 1.7–7.7)
Neutrophils Relative %: 79 %
Platelets: 206 10*3/uL (ref 150–400)
RBC: 4.1 MIL/uL — ABNORMAL LOW (ref 4.22–5.81)
RDW: 14.6 % (ref 11.5–15.5)
WBC: 8 10*3/uL (ref 4.0–10.5)
nRBC: 0 % (ref 0.0–0.2)

## 2018-10-11 LAB — BASIC METABOLIC PANEL
Anion gap: 8 (ref 5–15)
BUN: 12 mg/dL (ref 8–23)
CO2: 31 mmol/L (ref 22–32)
Calcium: 8.6 mg/dL — ABNORMAL LOW (ref 8.9–10.3)
Chloride: 99 mmol/L (ref 98–111)
Creatinine, Ser: 1.11 mg/dL (ref 0.61–1.24)
GFR calc Af Amer: >60 mL/min (ref >60)
GFR calc non Af Amer: >60 mL/min (ref >60)
Glucose, Bld: 232 mg/dL — ABNORMAL HIGH (ref 70–99)
Potassium: 3.9 mmol/L (ref 3.5–5.1)
Sodium: 138 mmol/L (ref 135–145)

## 2018-10-11 LAB — TROPONIN I: Troponin I: <0.03 ng/mL (ref <0.03)

## 2018-10-11 LAB — BRAIN NATRIURETIC PEPTIDE: B Natriuretic Peptide: 326 pg/mL — ABNORMAL HIGH (ref 0.0–100.0)

## 2018-10-11 LAB — CBG MONITORING, ED
Glucose-Capillary: 230 mg/dL — ABNORMAL HIGH (ref 70–99)
Glucose-Capillary: 297 mg/dL — ABNORMAL HIGH (ref 70–99)

## 2018-10-11 MED ORDER — ENOXAPARIN SODIUM 60 MG/0.6ML ~~LOC~~ SOLN
60.0000 mg | SUBCUTANEOUS | Status: DC
  Administered 2018-10-12 – 2018-10-14 (×3): 60 mg via SUBCUTANEOUS
  Filled 2018-10-11 (×3): qty 0.6

## 2018-10-11 MED ORDER — INSULIN GLARGINE 100 UNIT/ML ~~LOC~~ SOLN
25.0000 [IU] | Freq: Two times a day (BID) | SUBCUTANEOUS | Status: DC
  Administered 2018-10-12 – 2018-10-14 (×6): 25 [IU] via SUBCUTANEOUS
  Filled 2018-10-11 (×8): qty 0.25

## 2018-10-11 MED ORDER — ASPIRIN EC 81 MG PO TBEC
81.0000 mg | DELAYED_RELEASE_TABLET | Freq: Every day | ORAL | Status: DC
  Administered 2018-10-12 – 2018-10-14 (×3): 81 mg via ORAL
  Filled 2018-10-11 (×3): qty 1

## 2018-10-11 MED ORDER — VITAMIN B-12 1000 MCG PO TABS
1000.0000 ug | ORAL_TABLET | Freq: Every day | ORAL | Status: DC
  Administered 2018-10-12 – 2018-10-14 (×3): 1000 ug via ORAL
  Filled 2018-10-11 (×3): qty 1

## 2018-10-11 MED ORDER — INSULIN ASPART 100 UNIT/ML ~~LOC~~ SOLN
0.0000 [IU] | Freq: Three times a day (TID) | SUBCUTANEOUS | Status: DC
  Administered 2018-10-12: 10 [IU] via SUBCUTANEOUS
  Administered 2018-10-12: 11 [IU] via SUBCUTANEOUS
  Administered 2018-10-12 – 2018-10-13 (×2): 5 [IU] via SUBCUTANEOUS

## 2018-10-11 MED ORDER — IPRATROPIUM BROMIDE 0.02 % IN SOLN
0.5000 mg | Freq: Once | RESPIRATORY_TRACT | Status: AC
  Administered 2018-10-11: 0.5 mg via RESPIRATORY_TRACT
  Filled 2018-10-11: qty 2.5

## 2018-10-11 MED ORDER — MOMETASONE FURO-FORMOTEROL FUM 200-5 MCG/ACT IN AERO
2.0000 | INHALATION_SPRAY | Freq: Two times a day (BID) | RESPIRATORY_TRACT | Status: DC
  Administered 2018-10-12: 2 via RESPIRATORY_TRACT
  Filled 2018-10-11: qty 8.8

## 2018-10-11 MED ORDER — ALBUTEROL SULFATE (2.5 MG/3ML) 0.083% IN NEBU: 3.0000 mL | INHALATION_SOLUTION | Freq: Four times a day (QID) | RESPIRATORY_TRACT | Status: DC | PRN

## 2018-10-11 MED ORDER — ALPRAZOLAM 0.25 MG PO TABS
0.2500 mg | ORAL_TABLET | Freq: Three times a day (TID) | ORAL | Status: DC | PRN
  Administered 2018-10-12 – 2018-10-14 (×4): 0.25 mg via ORAL
  Filled 2018-10-11 (×4): qty 1

## 2018-10-11 MED ORDER — SODIUM CHLORIDE 0.9% FLUSH
3.0000 mL | INTRAVENOUS | Status: DC | PRN
  Administered 2018-10-13: 3 mL via INTRAVENOUS
  Filled 2018-10-11: qty 3

## 2018-10-11 MED ORDER — DILTIAZEM HCL ER COATED BEADS 240 MG PO CP24
240.0000 mg | ORAL_CAPSULE | Freq: Every day | ORAL | Status: DC
  Administered 2018-10-12 – 2018-10-14 (×3): 240 mg via ORAL
  Filled 2018-10-11 (×3): qty 1

## 2018-10-11 MED ORDER — METHYLPREDNISOLONE SODIUM SUCC 125 MG IJ SOLR
125.0000 mg | Freq: Once | INTRAMUSCULAR | Status: AC
  Administered 2018-10-11: 125 mg via INTRAVENOUS
  Filled 2018-10-11: qty 2

## 2018-10-11 MED ORDER — ACETAMINOPHEN 325 MG PO TABS
650.0000 mg | ORAL_TABLET | Freq: Four times a day (QID) | ORAL | Status: DC | PRN
  Administered 2018-10-12 – 2018-10-13 (×2): 650 mg via ORAL
  Filled 2018-10-11 (×2): qty 2

## 2018-10-11 MED ORDER — SODIUM CHLORIDE 0.9% FLUSH
3.0000 mL | Freq: Two times a day (BID) | INTRAVENOUS | Status: DC
  Administered 2018-10-12 – 2018-10-14 (×5): 3 mL via INTRAVENOUS

## 2018-10-11 MED ORDER — FUROSEMIDE 10 MG/ML IJ SOLN
40.0000 mg | Freq: Once | INTRAMUSCULAR | Status: AC
  Administered 2018-10-11: 40 mg via INTRAVENOUS
  Filled 2018-10-11: qty 4

## 2018-10-11 MED ORDER — GUAIFENESIN-DM 100-10 MG/5ML PO SYRP
10.0000 mL | ORAL_SOLUTION | ORAL | Status: DC | PRN
  Administered 2018-10-12 – 2018-10-13 (×2): 10 mL via ORAL
  Filled 2018-10-11 (×2): qty 10

## 2018-10-11 MED ORDER — PANTOPRAZOLE SODIUM 40 MG PO TBEC
40.0000 mg | DELAYED_RELEASE_TABLET | Freq: Every day | ORAL | Status: DC
  Administered 2018-10-12 – 2018-10-14 (×3): 40 mg via ORAL
  Filled 2018-10-11 (×3): qty 1

## 2018-10-11 MED ORDER — ALBUTEROL SULFATE (2.5 MG/3ML) 0.083% IN NEBU
5.0000 mg | INHALATION_SOLUTION | Freq: Once | RESPIRATORY_TRACT | Status: AC
  Administered 2018-10-11: 5 mg via RESPIRATORY_TRACT
  Filled 2018-10-11: qty 6

## 2018-10-11 MED ORDER — FOLIC ACID 1 MG PO TABS
1.0000 mg | ORAL_TABLET | Freq: Every day | ORAL | Status: DC
  Administered 2018-10-12 – 2018-10-14 (×3): 1 mg via ORAL
  Filled 2018-10-11 (×3): qty 1

## 2018-10-11 MED ORDER — SODIUM CHLORIDE 0.9 % IV SOLN: 250.0000 mL | INTRAVENOUS | Status: DC | PRN

## 2018-10-11 MED ORDER — UMECLIDINIUM BROMIDE 62.5 MCG/INH IN AEPB
1.0000 | INHALATION_SPRAY | Freq: Every day | RESPIRATORY_TRACT | Status: DC | PRN
  Administered 2018-10-14: 1 via RESPIRATORY_TRACT
  Filled 2018-10-11: qty 7

## 2018-10-11 MED ORDER — FUROSEMIDE 10 MG/ML IJ SOLN
40.0000 mg | Freq: Every day | INTRAMUSCULAR | Status: DC
  Administered 2018-10-12 – 2018-10-13 (×2): 40 mg via INTRAVENOUS
  Filled 2018-10-11 (×2): qty 4

## 2018-10-11 MED ORDER — ONDANSETRON HCL 4 MG/2ML IJ SOLN: 4.0000 mg | Freq: Four times a day (QID) | INTRAMUSCULAR | Status: DC | PRN

## 2018-10-11 MED ORDER — DOXYCYCLINE HYCLATE 100 MG PO TABS
100.0000 mg | ORAL_TABLET | Freq: Once | ORAL | Status: AC
  Administered 2018-10-11: 100 mg via ORAL
  Filled 2018-10-11: qty 1

## 2018-10-11 NOTE — ED Notes (Signed)
CBG [id number]

## 2018-10-11 NOTE — Telephone Encounter (Signed)
Routing message to BQ to advise if he was able to speak with Dr. Lillette Boxer at this time. BQ will be out of the office this week.

## 2018-10-11 NOTE — ED Triage Notes (Signed)
Pt comes via [facility] for SOB that has been gong on for the past month,from home. Hx of COPD. Diminished in the lower

## 2018-10-11 NOTE — ED Notes (Signed)
IV nurse at bedside attempting IV access. Nebulizer treatment in progress.
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
## 2018-10-12 ENCOUNTER — Other Ambulatory Visit: Payer: Self-pay

## 2018-10-12 DIAGNOSIS — J9621 Acute and chronic respiratory failure with hypoxia: Secondary | ICD-10-CM

## 2018-10-12 DIAGNOSIS — Z87892 Personal history of anaphylaxis: Secondary | ICD-10-CM | POA: Diagnosis not present

## 2018-10-12 DIAGNOSIS — I10 Essential (primary) hypertension: Secondary | ICD-10-CM | POA: Diagnosis not present

## 2018-10-12 DIAGNOSIS — Z9981 Dependence on supplemental oxygen: Secondary | ICD-10-CM | POA: Diagnosis not present

## 2018-10-12 DIAGNOSIS — Z8711 Personal history of peptic ulcer disease: Secondary | ICD-10-CM | POA: Diagnosis not present

## 2018-10-12 DIAGNOSIS — I5033 Acute on chronic diastolic (congestive) heart failure: Secondary | ICD-10-CM

## 2018-10-12 DIAGNOSIS — Z87891 Personal history of nicotine dependence: Secondary | ICD-10-CM | POA: Diagnosis not present

## 2018-10-12 DIAGNOSIS — Z79899 Other long term (current) drug therapy: Secondary | ICD-10-CM | POA: Diagnosis not present

## 2018-10-12 DIAGNOSIS — Z951 Presence of aortocoronary bypass graft: Secondary | ICD-10-CM | POA: Diagnosis not present

## 2018-10-12 DIAGNOSIS — Z7982 Long term (current) use of aspirin: Secondary | ICD-10-CM | POA: Diagnosis not present

## 2018-10-12 DIAGNOSIS — I252 Old myocardial infarction: Secondary | ICD-10-CM | POA: Diagnosis not present

## 2018-10-12 DIAGNOSIS — E785 Hyperlipidemia, unspecified: Secondary | ICD-10-CM | POA: Diagnosis present

## 2018-10-12 DIAGNOSIS — Z6832 Body mass index (BMI) 32.0-32.9, adult: Secondary | ICD-10-CM | POA: Diagnosis not present

## 2018-10-12 DIAGNOSIS — Z794 Long term (current) use of insulin: Secondary | ICD-10-CM | POA: Diagnosis not present

## 2018-10-12 DIAGNOSIS — F3162 Bipolar disorder, current episode mixed, moderate: Secondary | ICD-10-CM | POA: Diagnosis present

## 2018-10-12 DIAGNOSIS — K219 Gastro-esophageal reflux disease without esophagitis: Secondary | ICD-10-CM | POA: Diagnosis present

## 2018-10-12 DIAGNOSIS — I2510 Atherosclerotic heart disease of native coronary artery without angina pectoris: Secondary | ICD-10-CM | POA: Diagnosis present

## 2018-10-12 DIAGNOSIS — J431 Panlobular emphysema: Secondary | ICD-10-CM

## 2018-10-12 DIAGNOSIS — Z8701 Personal history of pneumonia (recurrent): Secondary | ICD-10-CM | POA: Diagnosis not present

## 2018-10-12 DIAGNOSIS — I110 Hypertensive heart disease with heart failure: Secondary | ICD-10-CM | POA: Diagnosis present

## 2018-10-12 DIAGNOSIS — J439 Emphysema, unspecified: Secondary | ICD-10-CM | POA: Diagnosis not present

## 2018-10-12 DIAGNOSIS — Z881 Allergy status to other antibiotic agents status: Secondary | ICD-10-CM | POA: Diagnosis not present

## 2018-10-12 DIAGNOSIS — J441 Chronic obstructive pulmonary disease with (acute) exacerbation: Secondary | ICD-10-CM | POA: Diagnosis present

## 2018-10-12 DIAGNOSIS — K449 Diaphragmatic hernia without obstruction or gangrene: Secondary | ICD-10-CM | POA: Diagnosis present

## 2018-10-12 DIAGNOSIS — E118 Type 2 diabetes mellitus with unspecified complications: Secondary | ICD-10-CM | POA: Diagnosis not present

## 2018-10-12 DIAGNOSIS — J9622 Acute and chronic respiratory failure with hypercapnia: Secondary | ICD-10-CM | POA: Diagnosis present

## 2018-10-12 DIAGNOSIS — E1140 Type 2 diabetes mellitus with diabetic neuropathy, unspecified: Secondary | ICD-10-CM | POA: Diagnosis present

## 2018-10-12 LAB — GLUCOSE, CAPILLARY
Glucose-Capillary: 561 mg/dL (ref 70–99)
Glucose-Capillary: >600 mg/dL (ref 70–99)
Glucose-Capillary: 422 mg/dL — ABNORMAL HIGH (ref 70–99)
Glucose-Capillary: 345 mg/dL — ABNORMAL HIGH (ref 70–99)
Glucose-Capillary: 249 mg/dL — ABNORMAL HIGH (ref 70–99)
Glucose-Capillary: 299 mg/dL — ABNORMAL HIGH (ref 70–99)

## 2018-10-12 LAB — BASIC METABOLIC PANEL
Anion gap: 13 (ref 5–15)
BUN: 13 mg/dL (ref 8–23)
CO2: 26 mmol/L (ref 22–32)
CREATININE: 1.3 mg/dL — AB (ref 0.61–1.24)
Calcium: 8.4 mg/dL — ABNORMAL LOW (ref 8.9–10.3)
Chloride: 94 mmol/L — ABNORMAL LOW (ref 98–111)
GFR calc Af Amer: >60 mL/min (ref >60)
GFR calc non Af Amer: 53 mL/min — ABNORMAL LOW (ref >60)
GLUCOSE: 524 mg/dL — AB (ref 70–99)
Potassium: 4.2 mmol/L (ref 3.5–5.1)
Sodium: 133 mmol/L — ABNORMAL LOW (ref 135–145)

## 2018-10-12 LAB — I-STAT ARTERIAL BLOOD GAS, ED
Acid-Base Excess: 6 mmol/L — ABNORMAL HIGH (ref 0.0–2.0)
Bicarbonate: 31.1 mmol/L — ABNORMAL HIGH (ref 20.0–28.0)
O2 Saturation: 93 %
Patient temperature: 98.9
TCO2: 33 mmol/L — AB (ref 22–32)
pCO2 arterial: 48.5 mmHg — ABNORMAL HIGH (ref 32.0–48.0)
pH, Arterial: 7.416 (ref 7.350–7.450)
pO2, Arterial: 69 mmHg — ABNORMAL LOW (ref 83.0–108.0)

## 2018-10-12 LAB — MRSA PCR SCREENING: MRSA by PCR: NEGATIVE

## 2018-10-12 MED ORDER — INSULIN ASPART 100 UNIT/ML ~~LOC~~ SOLN
5.0000 [IU] | Freq: Once | SUBCUTANEOUS | Status: AC
  Administered 2018-10-12: 5 [IU] via SUBCUTANEOUS

## 2018-10-12 MED ORDER — MOMETASONE FURO-FORMOTEROL FUM 200-5 MCG/ACT IN AERO
2.0000 | INHALATION_SPRAY | Freq: Every day | RESPIRATORY_TRACT | Status: DC
  Administered 2018-10-13: 2 via RESPIRATORY_TRACT

## 2018-10-12 MED ORDER — CITALOPRAM HYDROBROMIDE 20 MG PO TABS
40.0000 mg | ORAL_TABLET | Freq: Every day | ORAL | Status: DC
  Administered 2018-10-12 – 2018-10-14 (×3): 40 mg via ORAL
  Filled 2018-10-12 (×3): qty 2

## 2018-10-12 MED ORDER — ORAL CARE MOUTH RINSE
15.0000 mL | Freq: Two times a day (BID) | OROMUCOSAL | Status: DC
  Administered 2018-10-13 – 2018-10-14 (×3): 15 mL via OROMUCOSAL

## 2018-10-12 MED ORDER — PREGABALIN 25 MG PO CAPS
50.0000 mg | ORAL_CAPSULE | Freq: Once | ORAL | Status: AC
  Administered 2018-10-12: 50 mg via ORAL
  Filled 2018-10-12: qty 2

## 2018-10-12 MED ORDER — INSULIN ASPART 100 UNIT/ML ~~LOC~~ SOLN
15.0000 [IU] | Freq: Once | SUBCUTANEOUS | Status: AC
  Administered 2018-10-12: 15 [IU] via SUBCUTANEOUS

## 2018-10-12 MED ORDER — GABAPENTIN 300 MG PO CAPS
300.0000 mg | ORAL_CAPSULE | Freq: Once | ORAL | Status: AC
  Administered 2018-10-12: 300 mg via ORAL
  Filled 2018-10-12: qty 1

## 2018-10-12 MED ORDER — INSULIN ASPART 100 UNIT/ML ~~LOC~~ SOLN
6.0000 [IU] | Freq: Three times a day (TID) | SUBCUTANEOUS | Status: DC
  Administered 2018-10-12 – 2018-10-13 (×3): 6 [IU] via SUBCUTANEOUS

## 2018-10-12 NOTE — Progress Notes (Signed)
[facility] Progress Note  Subjective: no new c/o's. Good UOP on IV lasix.  No new c/o's.  On 4 L nasal here.   Vitals:   10/12/18 0059 10/12/18 0249 10/12/18 0408 10/12/18 1137  BP:   (!) 141/83 (!) 139/91  Pulse:   (!) 108 (!) 108  Resp:   (!) 22 17  Temp:   97.7 F (36.5 C) 98.1 F (36.7 C)  TempSrc:   Oral Oral  SpO2:   94% 97%  Weight: 120.5 kg 120.5 kg    Height: 6\' 3"  (1.905 m)       Inpatient medications: . aspirin EC  81 mg Oral Daily  . citalopram  40 mg Oral Daily  . diltiazem  240 mg Oral Daily  . enoxaparin (LOVENOX) injection  60 mg Subcutaneous Q24H  . folic acid  1 mg Oral Daily  . furosemide  40 mg Intravenous Daily  . insulin aspart  0-15 Units Subcutaneous TID WC  . insulin aspart  6 Units Subcutaneous TID WC  . insulin glargine  25 Units Subcutaneous BID  . mouth rinse  15 mL Mouth Rinse BID  . mometasone-formoterol  2 puff Inhalation BID  . pantoprazole  40 mg Oral Daily  . sodium chloride flush  3 mL Intravenous Q12H  . vitamin B-12  1,000 mcg Oral Daily   . sodium chloride     sodium chloride, acetaminophen, albuterol, ALPRAZolam, guaiFENesin-dextromethorphan, ondansetron (ZOFRAN) IV, sodium chloride flush, umeclidinium bromide  Exam: Constitutional: NAD, calm, comfortable Eyes: PERRL, lids and conjunctivae normal ENMT: Mucous membranes are moist. Posterior pharynx clear of any exudate or lesions.Normal dentition.  Neck: normal, supple, no masses, no thyromegaly Respiratory: some mild exp wheezing, no rales on R some a L base Cardiovascular: Regular rate and rhythm, no murmurs / rubs / gallops. No extremity edema. 2+ pedal pulses. No carotid bruits.  Abdomen: no tenderness, no masses palpated. No hepatosplenomegaly. Bowel sounds positive.  Musculoskeletal: no clubbing / cyanosis. No joint deformity upper and lower extremities. Good ROM, no contractures. Normal muscle tone.  Skin: no rashes, lesions, ulcers. No induration Neurologic: CN  2-12 grossly intact. Sensation intact, DTR normal. Strength 5/5 x 4 Psychiatric: Normal judgment and insight. Alert and oriented x 3. Normal mood.     Presentation Summary: [name] is a 77 y.o. male with medical history significant of AAA, diastolic CHF, COPD, DM2, HTN, obesity. Patient recently admitted to our service from 12/17 to 12/21 with SOB due to CHF and COPD.  During that admit they initially started steroids and lasix, had to stop steroids due to uncontrolled BGL.  Diuresed initially, then gave fluids after he had creat bump. Today he reports increasing SOB, wheezing, leg swelling, and having to go up to 7L on home O2 for symptom relief. ED Course: CXR showing small to moderate L plerual effusion.  Initially given solumedrol, lasix, and neb treatments.  Breathing improved some and O2 requirement down to 5.5L (baseline 4L at home).  BNP even higher than last admit, now at 326.     Hospital Problems/ Course:  Acute/ chronic resp failure w/ hypoxemia/ hypercarbia: pt has known hx severe COPD and deconditioning f/b Dr Lake Bells w/ CCM. Was on 2L O2 in 2016, up to 6L O2 in Jan 2018, CCM wanted to do HRCT for ILD (+crackles) but pt missed several appts it appears.  PFT's were repeated then, no change from his baseline. Unclear if this is worsening COPD, a/c diast CHF or something new (  ILD, see above). Plan cont IV lasix , watch creat (bumped to 1.3 today but this is likely his baseline) and dc IV lasix if rising further.  CXR was not real + for fluid. Get HRCT and consider pulm consult or curbside w/ Dr [place] Bells.  Possible acute/ chronic diast CHF: as above. Pt is on lasix 40 qd at home.   COPD, severe/ home O2: since at least 2016. Some wheezing today.  IV steroids stopped due to jump in BS's > 600.  Cont nebs, O2. F/b Dr [place] Bells.  Start sched Duoneb , cont prn albuterol nebs. HRCT of chest today.   HTN - cont home diltiazem 240 qd  DM2 Cont home Lantus 25u bid and 6u meal coverage.   Added SSI to get BS's down after bumped into 600 range yesterday.    Hx recurrent GIB: h/o AVM's and ulcers, numerous procedures by GI over last 5 yrs.   Bipolar d/o 1 - resume celexa, prn xanax   DVT prophylaxis: Lovenox Code Status: Full Family Communication: No family in room Disposition Plan: Home when better Consults called: None Admission status: INP now/ telemetry      Kelly Splinter MD [facility] pgr [phone] 10/12/2018, 3:59 PM   Recent Labs  Lab 10/06/18 0135 10/11/18 2024 10/12/18 0227  NA 140 138 133*  K 4.0 3.9 4.2  CL 99 99 94*  CO2 31 31 26   GLUCOSE 290* 232* 524*  BUN 15 12 13   CREATININE 1.17 1.11 1.30*  CALCIUM 8.7* 8.6* 8.4*   No results for input(s): AST, ALT, ALKPHOS, BILITOT, PROT, ALBUMIN in the last 168 hours. Recent Labs  Lab 10/11/18 2024  WBC 8.0  NEUTROABS 6.3  HGB 11.0*  HCT 37.8*  MCV 92.2  PLT 206   Iron/TIBC/Ferritin/ %Sat    Component Value Date/Time   IRON 25 (L) 06/28/2017 1419   TIBC 347 06/28/2017 1419   FERRITIN 48 06/28/2017 1419   IRONPCTSAT 7 (L) 06/28/2017 1419

## 2018-10-12 NOTE — Progress Notes (Signed)
Md notified gabapentin given at [place] with little to no relief.   8/10 neuropathy foot pain.  Tylenol does not help.  Saunders Revel T

## 2018-10-12 NOTE — ED Notes (Signed)
Dr. Rae Halsted ( admitting) notified on pt.'s elevated blood sugar.

## 2018-10-12 NOTE — Progress Notes (Signed)
Notified Md about pt's home medication Gabapentin 300 mg. Pt requesting.  [name]

## 2018-10-12 NOTE — Progress Notes (Addendum)
Inpatient Diabetes Program Recommendations  AACE/ADA: New Consensus Statement on Inpatient Glycemic Control (2015)  Target Ranges:  Prepandial:   less than 140 mg/dL      Peak postprandial:   less than 180 mg/dL (1-2 hours)      Critically ill patients:  140 - 180 mg/dL   Lab Results  Component Value Date   GLUCAP 422 (H) 10/12/2018   HGBA1C 9.8 (H) 10/04/2018     Results for [name], [name] (MRN [id number]) as of 10/12/2018 11:33  Ref. Range 10/11/2018 23:52 10/12/2018 03:57 10/12/2018 04:58 10/12/2018 07:36  Glucose-Capillary Latest Ref Range: 70 - 99 mg/dL 297 (H) 561 (HH) >600 (HH) 422 (H)    DM2   Home DM meds: Lantus 23 units BID; Novolog 11 units bid prn (with meals when blood sugar "high" per wife)  Current DM meds: Novolog moderate scale (0-15 units) tid ; Lantus 25 units BID    Note significant increase in blood glucose after Solumedrol 125 mg (one time dose) given last night at 2047.    Diabetes coordinators spoke with patient at length last week during prior admission regarding DM management at home. He stated his wife checks his blood sugar "a few times a week" and they adjust insulin dosage as needed. Emphasized importance of following up with his PCP for diabetes management.  Dietician also saw patient.    MD please consider following insulin recommendations:  Add Novolog 5 units meal coverage tid. (Hold if NPO or if patient eats less than 50% of meal.)   Thank you.  -- Will follow during hospitalization.--  Jonna Clark RN, MSN Diabetes Coordinator Inpatient Glycemic Control Team Team Pager: [phone] (8am-5pm)

## 2018-10-12 NOTE — Progress Notes (Signed)
Md made aware of pt's CBG 299 Saunders Revel T
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
## 2018-10-12 NOTE — Telephone Encounter (Signed)
I spoke to her last week

## 2018-10-13 ENCOUNTER — Inpatient Hospital Stay (HOSPITAL_COMMUNITY): Payer: Medicare HMO

## 2018-10-13 DIAGNOSIS — J439 Emphysema, unspecified: Secondary | ICD-10-CM

## 2018-10-13 DIAGNOSIS — J9622 Acute and chronic respiratory failure with hypercapnia: Secondary | ICD-10-CM

## 2018-10-13 DIAGNOSIS — I10 Essential (primary) hypertension: Secondary | ICD-10-CM

## 2018-10-13 DIAGNOSIS — E118 Type 2 diabetes mellitus with unspecified complications: Secondary | ICD-10-CM

## 2018-10-13 DIAGNOSIS — J9621 Acute and chronic respiratory failure with hypoxia: Secondary | ICD-10-CM

## 2018-10-13 LAB — BASIC METABOLIC PANEL
Anion gap: 13 (ref 5–15)
BUN: 28 mg/dL — ABNORMAL HIGH (ref 8–23)
CO2: 27 mmol/L (ref 22–32)
Calcium: 8.8 mg/dL — ABNORMAL LOW (ref 8.9–10.3)
Chloride: 92 mmol/L — ABNORMAL LOW (ref 98–111)
Creatinine, Ser: 1.36 mg/dL — ABNORMAL HIGH (ref 0.61–1.24)
GFR calc Af Amer: 58 mL/min — ABNORMAL LOW (ref >60)
GFR calc non Af Amer: 50 mL/min — ABNORMAL LOW (ref >60)
Glucose, Bld: 353 mg/dL — ABNORMAL HIGH (ref 70–99)
Potassium: 4.2 mmol/L (ref 3.5–5.1)
Sodium: 132 mmol/L — ABNORMAL LOW (ref 135–145)

## 2018-10-13 LAB — GLUCOSE, CAPILLARY
Glucose-Capillary: 291 mg/dL — ABNORMAL HIGH (ref 70–99)
Glucose-Capillary: 339 mg/dL — ABNORMAL HIGH (ref 70–99)
Glucose-Capillary: 195 mg/dL — ABNORMAL HIGH (ref 70–99)
Glucose-Capillary: 184 mg/dL — ABNORMAL HIGH (ref 70–99)

## 2018-10-13 LAB — CBC
HCT: 33.7 % — ABNORMAL LOW (ref 39.0–52.0)
Hemoglobin: 10.3 g/dL — ABNORMAL LOW (ref 13.0–17.0)
MCH: 27.3 pg (ref 26.0–34.0)
MCHC: 30.6 g/dL (ref 30.0–36.0)
MCV: 89.4 fL (ref 80.0–100.0)
Platelets: 262 10*3/uL (ref 150–400)
RBC: 3.77 MIL/uL — ABNORMAL LOW (ref 4.22–5.81)
RDW: 14.3 % (ref 11.5–15.5)
WBC: 11.4 10*3/uL — ABNORMAL HIGH (ref 4.0–10.5)
nRBC: 0 % (ref 0.0–0.2)

## 2018-10-13 MED ORDER — GABAPENTIN 300 MG PO CAPS
300.0000 mg | ORAL_CAPSULE | Freq: Four times a day (QID) | ORAL | Status: DC
  Administered 2018-10-13 – 2018-10-14 (×5): 300 mg via ORAL
  Filled 2018-10-13 (×5): qty 1

## 2018-10-13 MED ORDER — INSULIN ASPART 100 UNIT/ML ~~LOC~~ SOLN
3.0000 [IU] | Freq: Three times a day (TID) | SUBCUTANEOUS | Status: DC
  Administered 2018-10-13 – 2018-10-14 (×4): 3 [IU] via SUBCUTANEOUS

## 2018-10-13 MED ORDER — PREGABALIN 25 MG PO CAPS: 50.0000 mg | ORAL_CAPSULE | Freq: Every day | ORAL | Status: DC

## 2018-10-13 MED ORDER — INSULIN ASPART 100 UNIT/ML ~~LOC~~ SOLN: 0.0000 [IU] | Freq: Every day | SUBCUTANEOUS | Status: DC

## 2018-10-13 MED ORDER — INSULIN ASPART 100 UNIT/ML ~~LOC~~ SOLN
0.0000 [IU] | Freq: Three times a day (TID) | SUBCUTANEOUS | Status: DC
  Administered 2018-10-13: 15 [IU] via SUBCUTANEOUS
  Administered 2018-10-13: 4 [IU] via SUBCUTANEOUS
  Administered 2018-10-14 (×2): 11 [IU] via SUBCUTANEOUS

## 2018-10-13 NOTE — Progress Notes (Signed)
[facility]  PROGRESS NOTE  [name] RCV:[id number] DOB: 1940/12/25 DOA: 10/11/2018 PCP: [name], MD   Brief HPI:   77 year old male with a history of AAA, diastolic CHF, COPD, diabetes mellitus type 2, hypertension, obesity came with worsening shortness of breath.  Patient was recently discharged from the hospital.  Chest x-ray showed small to moderate left pleural effusion.  BNP was mild elevated at 326.  Started on IV Lasix.  Improved.    Subjective   Patient complains of dyspnea on exertion, appears anxious.   Assessment/Plan:     1. Dyspnea on exertion-multifactorial, patient has underlying COPD, also had mild pulmonary edema.  Improved with IV Lasix.  Patient is now becoming dry, creatinine started to rise.  Will hold IV Lasix at this time.  2. COPD/emphysema-stable, CT high-resolution obtained today which did not show any diffuse parenchymal lung disease/ILD.  Findings consistent with emphysema.  3. Acute on chronic diastolic CHF-patient started on IV Lasix, improved, creatinine is started to rise.  We will hold IV Lasix.  Patient appears dry on examination, does not appear to be in fluid overload.  4. Hypertension-blood pressure is stable, continue Cardizem.  5. Diabetes mellitus type 2-blood glucose is elevated, continue Lantus 25 units subcu twice daily.  Will change sliding scale to resistant.  Cut down the dose of meal coverage to 3 units subcu 3 times daily with meals.     CBG: Recent Labs  Lab 10/12/18 1148 10/12/18 1604 10/12/18 2110 10/13/18 0826 10/13/18 1221  GLUCAP 345* 249* 299* 291* 339*    CBC: Recent Labs  Lab 10/11/18 2024 10/13/18 0150  WBC 8.0 11.4*  NEUTROABS 6.3  --   HGB 11.0* 10.3*  HCT 37.8* 33.7*  MCV 92.2 89.4  PLT 206 102    Basic Metabolic Panel: Recent Labs  Lab 10/11/18 2024 10/12/18 0227 10/13/18 0150  NA 138 133* 132*  K 3.9 4.2 4.2  CL 99 94* 92*  CO2 31 26 27   GLUCOSE 232* 524* 353*  BUN  12 13 28*  CREATININE 1.11 1.30* 1.36*  CALCIUM 8.6* 8.4* 8.8*     DVT prophylaxis: *Lovenox  Code Status: Full code  Family Communication: No family at bedside  Disposition Plan: likely home when medically ready for discharge   Consultants:  None  Procedures:  None   Antibiotics:   Anti-infectives (From admission, onward)   Start     Dose/Rate Route Frequency Ordered Stop   10/11/18 2145  doxycycline (VIBRA-TABS) tablet 100 mg     100 mg Oral  Once 10/11/18 2134 10/11/18 2151       Objective   Vitals:   10/12/18 2326 10/13/18 0227 10/13/18 0724 10/13/18 1139  BP: (!) 143/74 137/67  (!) 153/84  Pulse:  76  86  Resp:  20  19  Temp: 98.8 F (37.1 C) (!) 97.4 F (36.3 C)    TempSrc: Oral Oral    SpO2:  92% 96% 93%  Weight:  120.5 kg    Height:        Intake/Output Summary (Last 24 hours) at 10/13/2018 1327 Last data filed at 10/13/2018 1300 Gross per 24 hour  Intake 720 ml  Output 3250 ml  Net -2530 ml   Filed Weights   10/12/18 0059 10/12/18 0249 10/13/18 0227  Weight: 120.5 kg 120.5 kg 120.5 kg     Physical Examination:    General: Appears anxious  Cardiovascular: S1-S2, regular  Respiratory: Decreased breath sounds bilaterally  Abdomen: Soft,  nontender, no organomegaly  Extremities: Trace edema in the lower extremities  Neurologic: Alert, oriented x3, no focal deficit noted at this time.     Data Reviewed: I have personally reviewed following labs and imaging studies   Recent Results (from the past 240 hour(s))  MRSA PCR Screening     Status: None   Collection Time: 10/12/18  1:20 AM  Result Value Ref Range Status   MRSA by PCR NEGATIVE NEGATIVE Final    Comment:        The GeneXpert MRSA Assay (FDA approved for NASAL specimens only), is one component of a comprehensive MRSA colonization surveillance program. It is not intended to diagnose MRSA infection nor to guide or monitor treatment for MRSA infections. Performed  at [facility], [place] [address] [place], [place] [address]      Liver Function Tests: No results for input(s): AST, ALT, ALKPHOS, BILITOT, PROT, ALBUMIN in the last 168 hours. No results for input(s): LIPASE, AMYLASE in the last 168 hours. No results for input(s): AMMONIA in the last 168 hours.  Cardiac Enzymes: Recent Labs  Lab 10/11/18 2024  TROPONINI <0.03   BNP (last 3 results) Recent Labs    02/08/18 0323 10/02/18 0051 10/11/18 2017  BNP 54.2 230.0* 326.0*    ProBNP (last 3 results) No results for input(s): PROBNP in the last 8760 hours.    Studies: Dg Chest 2 View  Result Date: 10/11/2018 CLINICAL DATA:  Cough, dizziness and dyspnea x1 day EXAM: CHEST - 2 VIEW COMPARISON:  CXR 10/02/2018, chest CT 10/03/2018 FINDINGS: Stable cardiomegaly with aortic atherosclerosis. Median sternotomy sutures are noted. Small to moderate moderate left posterior pleural effusion is identified blunting the costophrenic angle. No overt pulmonary edema. No acute osseous abnormality. Degenerative changes are present along the dorsal spine. IMPRESSION: 1. Cardiomegaly with aortic atherosclerosis. 2. Small to moderate left posterior pleural effusion. Electronically Signed   By: Ashley Royalty M.D.   On: 10/11/2018 21:26   Ct Chest High Resolution  Result Date: 10/13/2018 CLINICAL DATA:  77 year old male with history of chest pain. EXAM: CT CHEST WITHOUT CONTRAST TECHNIQUE: Multidetector CT imaging of the chest was performed following the standard protocol without intravenous contrast. High resolution imaging of the lungs, as well as inspiratory and expiratory imaging, was performed. COMPARISON:  Chest CT 10/03/2018. FINDINGS: Cardiovascular: Heart size is normal. There is no significant pericardial fluid, thickening or pericardial calcification. There is aortic atherosclerosis, as well as atherosclerosis of the great vessels of the mediastinum and the coronary arteries, including calcified  atherosclerotic plaque in the left main, left anterior descending and right coronary arteries. Status post median sternotomy for CABG including LIMA to the LAD. Moderate calcifications of the aortic valve. Mediastinum/Nodes: No pathologically enlarged mediastinal or hilar lymph nodes. Large hiatal hernia no axillary lymphadenopathy Lungs/Pleura: High-resolution images demonstrate no significant regions of ground-glass attenuation, subpleural reticulation, traction bronchiectasis or frank honeycombing. Inspiratory and expiratory imaging is unremarkable. No suspicious pulmonary nodules or masses are noted. No acute consolidative airspace disease. Trace bilateral pleural effusions lying dependently. Atelectasis and/or scarring in the left lower lobe. Diffuse bronchial wall thickening with generally mild centrilobular and paraseptal emphysema, in addition to a large bulla in the left upper lobe near the apex. Upper Abdomen: Mild diffuse low attenuation throughout the visualized hepatic parenchyma, indicative of hepatic steatosis. Aortic atherosclerosis. Musculoskeletal: Median sternotomy wires. There are no aggressive appearing lytic or blastic lesions noted in the visualized portions of the skeleton. IMPRESSION: 1. No definite findings  to suggest interstitial lung disease. 2. Mild diffuse bronchial thickening with mild centrilobular and paraseptal emphysema; imaging findings suggestive of underlying COPD. 3. Aortic atherosclerosis, in addition to left main and 2 vessel coronary artery disease. Status post median sternotomy for CABG including LIMA to the LAD. 4. There are calcifications of the aortic valve. Echocardiographic correlation for evaluation of potential valvular dysfunction may be warranted if clinically indicated. 5. Large hiatal hernia. 6. Hepatic steatosis. Aortic Atherosclerosis (ICD10-I70.0) and Emphysema (ICD10-J43.9). Electronically Signed   By: Vinnie Langton M.D.   On: 10/13/2018 12:30     Scheduled Meds: . aspirin EC  81 mg Oral Daily  . citalopram  40 mg Oral Daily  . diltiazem  240 mg Oral Daily  . enoxaparin (LOVENOX) injection  60 mg Subcutaneous Q24H  . folic acid  1 mg Oral Daily  . gabapentin  300 mg Oral QID  . insulin aspart  0-20 Units Subcutaneous TID WC  . insulin aspart  0-5 Units Subcutaneous QHS  . insulin aspart  3 Units Subcutaneous TID WC  . insulin glargine  25 Units Subcutaneous BID  . mouth rinse  15 mL Mouth Rinse BID  . mometasone-formoterol  2 puff Inhalation Daily  . pantoprazole  40 mg Oral Daily  . sodium chloride flush  3 mL Intravenous Q12H  . vitamin B-12  1,000 mcg Oral Daily    Admission status: Inpatient: Based on patients clinical presentation and evaluation of above clinical data, I have made determination that patient meets Inpatient criteria at this time.  Patient will need more than 2 midnight stay in the hospital, he is on IV Lasix.  Time spent: 25 min  [facility] Pager [phone]. If 7PM-7AM, please contact night-coverage at [email or web address], Office  [phone]  password [email or web address]  10/13/2018, 1:27 PM  LOS: 1 day

## 2018-10-14 LAB — BASIC METABOLIC PANEL
Anion gap: 11 (ref 5–15)
BUN: 28 mg/dL — ABNORMAL HIGH (ref 8–23)
CO2: 31 mmol/L (ref 22–32)
CREATININE: 1.24 mg/dL (ref 0.61–1.24)
Calcium: 8.8 mg/dL — ABNORMAL LOW (ref 8.9–10.3)
Chloride: 94 mmol/L — ABNORMAL LOW (ref 98–111)
GFR calc Af Amer: >60 mL/min (ref >60)
GFR calc non Af Amer: 56 mL/min — ABNORMAL LOW (ref >60)
Glucose, Bld: 223 mg/dL — ABNORMAL HIGH (ref 70–99)
Potassium: 3.8 mmol/L (ref 3.5–5.1)
Sodium: 136 mmol/L (ref 135–145)

## 2018-10-14 LAB — GLUCOSE, CAPILLARY
Glucose-Capillary: 266 mg/dL — ABNORMAL HIGH (ref 70–99)
Glucose-Capillary: 259 mg/dL — ABNORMAL HIGH (ref 70–99)

## 2018-10-14 MED ORDER — FUROSEMIDE 20 MG PO TABS: 20.0000 mg | ORAL_TABLET | Freq: Every day | ORAL | 1 refills | Status: DC

## 2018-10-14 MED ORDER — GABAPENTIN 300 MG PO CAPS: 600.0000 mg | ORAL_CAPSULE | Freq: Three times a day (TID) | ORAL | 1 refills | Status: DC

## 2018-10-14 NOTE — Progress Notes (Signed)
Patient is ready for discharge. He is alert and oriented. He has been taken off telemetry, CCMD notified. IV has been removed without complication. He has all of his belongings with him. All discharge instructions have been discussed with patient and questions have been answered. He will be transported home by his wife. He will leave unit by wheelchair and he has his own oxygen connected to him. He will meet wife at the front entrance of the hospital.

## 2018-10-14 NOTE — Discharge Summary (Addendum)
Physician Discharge Summary  [name] JAS:[id number] DOB: 04/07/1941 DOA: 10/11/2018  PCP: [name], MD  Admit date: 10/11/2018 Discharge date: 10/14/2018  Time spent: 45* minutes  Recommendations for Outpatient Follow-up:  1. Start taking Lasix 20 mg po daily   Discharge Diagnoses:  Principal Problem:   Acute on chronic respiratory failure with hypoxia and hypercapnia (HCC) Active Problems:   Essential hypertension   COPD (chronic obstructive pulmonary disease) (HCC)   Diabetes mellitus with complication (HCC)   Acute on chronic diastolic CHF (congestive heart failure) (HCC)   Acute on chronic respiratory failure with hypoxia [facility])   Discharge Condition: Stable  Diet recommendation: heart healthy diet  Filed Weights   10/12/18 0249 10/13/18 0227 10/14/18 0351  Weight: 120.5 kg 120.5 kg 119.1 kg    History of present illness:  77 year old male with a history of AAA, diastolic CHF, COPD, diabetes mellitus type 2, hypertension, obesity came with worsening shortness of breath.  Patient was recently discharged from the hospital.  Chest x-ray showed small to moderate left pleural effusion.  BNP was mild elevated at 326.  Started on IV Lasix  Hospital Course:    1.  Dyspnea on exertion-multifactorial, patient has underlying COPD, also had mild pulmonary edema.  Improved with IV Lasix.  Patient is now  back to baseline. Continue oxygen 4L/m at home.  2. COPD/emphysema-stable, CT high-resolution obtained today which did not show any diffuse parenchymal lung disease/ILD.  Findings consistent with emphysema. Continue symbicort, tiotropium  3. Acute on chronic diastolic CHF-patient started on IV Lasix, improved, creatinine is stable, will start Lasix 20 mg po daily. Discussed importance of low salt diet.  4. Hypertension-blood pressure is stable, continue Cardizem.  5. Diabetes mellitus type 2-blood glucose is elevated, continue Lantus 25 units subcu twice daily.  Meal coverage 6 units Novolog tid  6. Diabetic neuropathy-patient takes gabapentin 300 mg 3 times daily at home, which has not been able to control the pain.  Wants dose to be changed.  Will increase the dose of gabapentin to 600 mg 3 times daily.   Procedures:  None   Consultations:  None   Discharge Exam: Vitals:   10/14/18 0351 10/14/18 0808  BP: 130/75   Pulse: 74   Resp: 17   Temp: 97.7 F (36.5 C)   SpO2: 96% 97%    General: Appears in no acute distress Cardiovascular: S1S2 RRR Respiratory: Clear bilaterally  Discharge Instructions   Discharge Instructions    Diet - low sodium heart healthy   Complete by:  As directed    Increase activity slowly   Complete by:  As directed      Allergies as of 10/14/2018      Reactions   Morphine And Related Shortness Of Breath, Nausea And Vomiting, Rash, Other (See Comments)   Reaction:  Hallucinations    Penicillins Anaphylaxis, Hives, Other (See Comments)   10/02/18 - discussed with patient and he states he can tolerate penicillin capsules and had some hives when he was 77yo and received pcn injections Has patient had a PCN reaction causing immediate rash, facial/tongue/throat swelling, SOB or lightheadedness with hypotension: Yes Has patient had a PCN reaction causing severe rash involving mucus membranes or skin necrosis: No Has patient had a PCN reaction that required hospitalization No Has patient had a PCN reaction occurring within the last 10 y   Zolpidem Shortness Of Breath   Demerol [meperidine] Other (See Comments)   Reaction:  Hallucinations     Dilaudid [  hydromorphone Hcl] Other (See Comments)   Reaction:  Hallucinations    Levofloxacin Other (See Comments)   Reaction:  Unknown       Medication List    TAKE these medications   acetaminophen 325 MG tablet Commonly known as:  TYLENOL Take 650 mg by mouth every 6 (six) hours as needed for mild pain, fever or headache.   albuterol 108 (90 Base) MCG/ACT  inhaler Commonly known as:  PROVENTIL HFA;VENTOLIN HFA Inhale 2 puffs into the lungs every 6 (six) hours as needed for wheezing or shortness of breath.   ALPRAZolam 0.25 MG tablet Commonly known as:  XANAX Take 1 tablet (0.25 mg total) by mouth 3 (three) times daily as needed for anxiety or sleep.   aspirin 81 MG EC tablet Take 1 tablet (81 mg total) by mouth daily.   citalopram 40 MG tablet Commonly known as:  CELEXA Take 0.5 tablets (20 mg total) by mouth daily.   diltiazem 120 MG 24 hr capsule Commonly known as:  CARTIA XT Take 2 capsules (240 mg total) by mouth daily.   ferrous sulfate 325 (65 FE) MG tablet Take 1 tablet (325 mg total) by mouth 3 (three) times daily with meals.   folic acid 1 MG tablet Commonly known as:  FOLVITE Take 1 mg by mouth daily.   furosemide 20 MG tablet Commonly known as:  LASIX Take 1 tablet (20 mg total) by mouth daily. What changed:    when to take this  reasons to take this   gabapentin 300 MG capsule Commonly known as:  NEURONTIN Take 1 capsule (300 mg total) by mouth 3 (three) times daily for 15 days. What changed:  when to take this   guaiFENesin-dextromethorphan 100-10 MG/5ML syrup Commonly known as:  ROBITUSSIN DM Take 10 mLs by mouth every 4 (four) hours as needed for cough.   insulin aspart 100 UNIT/ML injection Commonly known as:  novoLOG Inject 6 Units into the skin 3 (three) times daily with meals.   insulin glargine 100 UNIT/ML injection Commonly known as:  LANTUS Inject 0.25 mLs (25 Units total) into the skin 2 (two) times daily. What changed:  how much to take   pantoprazole 40 MG tablet Commonly known as:  PROTONIX Take 1 tablet (40 mg total) by mouth daily. What changed:  when to take this   sucralfate 1 g tablet Commonly known as:  CARAFATE Take 1 tablet (1 g total) by mouth 4 (four) times daily -  with meals and at bedtime.   SYMBICORT 160-4.5 MCG/ACT inhaler Generic drug:   budesonide-formoterol Inhale 2 puffs into the lungs 2 (two) times daily.   tiotropium 18 MCG inhalation capsule Commonly known as:  SPIRIVA Place 1 capsule (18 mcg total) into inhaler and inhale daily as needed (shortness of breath).   vitamin B-12 1000 MCG tablet Commonly known as:  CYANOCOBALAMIN Take 1,000 mcg by mouth daily.      Allergies  Allergen Reactions  . Morphine And Related Shortness Of Breath, Nausea And Vomiting, Rash and Other (See Comments)    Reaction:  Hallucinations   . Penicillins Anaphylaxis, Hives and Other (See Comments)    10/02/18 - discussed with patient and he states he can tolerate penicillin capsules and had some hives when he was 77yo and received pcn injections  Has patient had a PCN reaction causing immediate rash, facial/tongue/throat swelling, SOB or lightheadedness with hypotension: Yes Has patient had a PCN reaction causing severe rash involving mucus membranes or skin  necrosis: No Has patient had a PCN reaction that required hospitalization No Has patient had a PCN reaction occurring within the last 10 y  . Zolpidem Shortness Of Breath  . Demerol [Meperidine] Other (See Comments)    Reaction:  Hallucinations    . Dilaudid [Hydromorphone Hcl] Other (See Comments)    Reaction:  Hallucinations   . Levofloxacin Other (See Comments)    Reaction:  Unknown       The results of significant diagnostics from this hospitalization (including imaging, microbiology, ancillary and laboratory) are listed below for reference.    Significant Diagnostic Studies: Dg Chest 2 View  Result Date: 10/11/2018 CLINICAL DATA:  Cough, dizziness and dyspnea x1 day EXAM: CHEST - 2 VIEW COMPARISON:  CXR 10/02/2018, chest CT 10/03/2018 FINDINGS: Stable cardiomegaly with aortic atherosclerosis. Median sternotomy sutures are noted. Small to moderate moderate left posterior pleural effusion is identified blunting the costophrenic angle. No overt pulmonary edema. No acute  osseous abnormality. Degenerative changes are present along the dorsal spine. IMPRESSION: 1. Cardiomegaly with aortic atherosclerosis. 2. Small to moderate left posterior pleural effusion. Electronically Signed   By: Ashley Royalty M.D.   On: 10/11/2018 21:26   Ct Angio Chest Pe W Or Wo Contrast  Result Date: 10/03/2018 CLINICAL DATA:  Hypoxia and shortness of breath. Abnormal V/Q examination. EXAM: CT ANGIOGRAPHY CHEST WITH CONTRAST TECHNIQUE: Multidetector CT imaging of the chest was performed using the standard protocol during bolus administration of intravenous contrast. Multiplanar CT image reconstructions and MIPs were obtained to evaluate the vascular anatomy. CONTRAST:  122mL ISOVUE-370 IOPAMIDOL (ISOVUE-370) INJECTION 76% COMPARISON:  V/Q chest October 03, 2018 and chest radiograph October 02, 2018 and CT chest May 08, 2018 FINDINGS: CARDIOVASCULAR: Adequate contrast opacification of the pulmonary artery's. Main pulmonary artery is not enlarged. No pulmonary arterial filling defects to the level of the subsegmental branches. The heart is upper limits of normal in size. No pericardial effusion. Moderate coronary artery calcifications. No pericardial effusion. 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 Mild calcific atherosclerosis, 2 vessel arch is a normal variant. MEDIASTINUM/NODES: No lymphadenopathy by CT size criteria. Prominent mediastinal fat. LUNGS/PLEURA: Tracheobronchial tree is patent, no pneumothorax. Bronchial wall thickening. 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 Bilateral lower lobe atelectasis, pleural fat extending into RIGHT major fissure. LEFT upper lobe atelectasis versus scarring. LEFT apical bulla. No pleural effusion. UPPER ABDOMEN: Non-acute.  Large hiatal hernia. MUSCULOSKELETAL: Non-acute. Status post median sternotomy. 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 Review of the MIP images confirms the  above findings. IMPRESSION: 1. No acute pulmonary embolism. 2. 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 Scattered atelectasis. 3. Large hiatal hernia. 4. **An incidental finding of potential clinical significance has been found. 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 Recommend annual imaging followup by CTA or MRA. This recommendation follows 2010 ACCF/AHA/AATS/ACR/ASA/SCA/SCAI/SIR/STS/SVM Guidelines for the Diagnosis and Management of Patients with Thoracic Aortic Disease. Circulation. 2010; 121: E751-Z001** Aortic Atherosclerosis (ICD10-I70.0). Electronically Signed   By: Elon Alas M.D.   On: 10/03/2018 22:35   Ct Chest High Resolution  Result Date: 10/13/2018 CLINICAL DATA:  77 year old male with history of chest pain. EXAM: CT CHEST WITHOUT CONTRAST TECHNIQUE: Multidetector CT imaging of the chest was performed following the standard protocol without intravenous contrast. High resolution imaging of the lungs, as well as inspiratory and expiratory imaging, was performed. COMPARISON:  Chest CT 10/03/2018. FINDINGS: Cardiovascular: Heart size is normal. There is no significant pericardial  fluid, thickening or pericardial calcification. There is aortic atherosclerosis, as well as atherosclerosis of the great vessels of the mediastinum and the coronary arteries, including calcified atherosclerotic plaque in the left main, left anterior descending and right coronary arteries. Status post median sternotomy for CABG including LIMA to the LAD. Moderate calcifications of the aortic valve. Mediastinum/Nodes: No pathologically enlarged mediastinal or hilar lymph nodes. Large hiatal hernia no axillary lymphadenopathy Lungs/Pleura: High-resolution images demonstrate no significant regions of ground-glass attenuation, subpleural reticulation, traction bronchiectasis or frank honeycombing. Inspiratory and expiratory imaging is unremarkable. No  suspicious pulmonary nodules or masses are noted. No acute consolidative airspace disease. Trace bilateral pleural effusions lying dependently. Atelectasis and/or scarring in the left lower lobe. Diffuse bronchial wall thickening with generally mild centrilobular and paraseptal emphysema, in addition to a large bulla in the left upper lobe near the apex. Upper Abdomen: Mild diffuse low attenuation throughout the visualized hepatic parenchyma, indicative of hepatic steatosis. Aortic atherosclerosis. Musculoskeletal: Median sternotomy wires. There are no aggressive appearing lytic or blastic lesions noted in the visualized portions of the skeleton. IMPRESSION: 1. No definite findings to suggest interstitial lung disease. 2. Mild diffuse bronchial thickening with mild centrilobular and paraseptal emphysema; imaging findings suggestive of underlying COPD. 3. Aortic atherosclerosis, in addition to left main and 2 vessel coronary artery disease. Status post median sternotomy for CABG including LIMA to the LAD. 4. There are calcifications of the aortic valve. Echocardiographic correlation for evaluation of potential valvular dysfunction may be warranted if clinically indicated. 5. Large hiatal hernia. 6. Hepatic steatosis. Aortic Atherosclerosis (ICD10-I70.0) and Emphysema (ICD10-J43.9). Electronically Signed   By: Vinnie Langton M.D.   On: 10/13/2018 12:30   Nm Pulmonary Perf And Vent  Result Date: 10/03/2018 CLINICAL DATA:  Elevated D-dimer question pulmonary embolism, worsening shortness of breath, known history of CHF, COPD, essential hypertension, type II diabetes mellitus EXAM: NUCLEAR MEDICINE VENTILATION - PERFUSION LUNG SCAN TECHNIQUE: Ventilation images were obtained in multiple projections using inhaled aerosol Tc-38m DTPA. Perfusion images were obtained in multiple projections after intravenous injection of Tc-27m MAA. RADIOPHARMACEUTICALS:  30 mCi of Tc-68m DTPA aerosol inhalation and 4 mCi Tc31m MAA IV  COMPARISON:  None Correlation: Chest radiograph 10/02/2018 FINDINGS: Ventilation: Central airway deposition of aerosol. Diminished ventilation in LEFT lower lobe. Enlargement of cardiac silhouette. Perfusion: Diminished perfusion in LEFT lower lobe though matching the ventilation defect. Single small subsegmental perfusion defect at superior segment RIGHT lower lobe. No additional perfusion defects identified. Enlargement of cardiac silhouette. Chest radiograph: Enlargement of cardiac silhouette post CABG with bibasilar atelectasis greater on LEFT. IMPRESSION: Cardiomegaly. Diminished ventilation and perfusion in LEFT lower lobe with matching radiographic opacity in the LEFT lower lobe on chest radiograph of 10/02/2018. Large triple matched lower lobe abnormalities represent an intermediate probability for pulmonary embolism. Electronically Signed   By: Lavonia Dana M.D.   On: 10/03/2018 14:12   Dg Chest Portable 1 View  Result Date: 10/02/2018 CLINICAL DATA:  Shortness of breath today. History of cardiac surgery, hypertension. EXAM: PORTABLE CHEST 1 VIEW COMPARISON:  Chest radiograph February 08, 2018 FINDINGS: Stable cardiomegaly. Status post median sternotomy for CABG. Calcified aortic arch. Pulmonary vascular congestion and increased interstitial prominence. RIGHT lung base bandlike density. LEFT lung base out of field of view, small LEFT pleural effusion. Biapical bullous changes. No pneumothorax. Osseous structures are non suspicious. IMPRESSION: 1. Stable cardiomegaly. Increasing interstitial prominence most consistent with pulmonary edema. Small LEFT pleural effusion. 2. RIGHT lung base atelectasis/scarring. 3.  Emphysema (ICD10-J43.9).  Aortic Atherosclerosis (  ICD10-I70.0). Electronically Signed   By: Elon Alas M.D.   On: 10/02/2018 01:42   Vas [place] Lower Extremity Venous (dvt)  Result Date: 10/02/2018  Lower Venous Study Indications: Swelling, Edema, hypoxia, and SOB. Other Indications: CHF,  Chronic GI bleeding. Limitations: Body habitus. Performing Technologist: Rudell Cobb  Examination Guidelines: A complete evaluation includes B-mode imaging, spectral Doppler, color Doppler, and power Doppler as needed of all accessible portions of each vessel. Bilateral testing is considered an integral part of a complete examination. Limited examinations for reoccurring indications may be performed as noted.  Right Venous Findings: +---------+---------------+---------+-----------+----------+-------------------+          CompressibilityPhasicitySpontaneityPropertiesSummary             +---------+---------------+---------+-----------+----------+-------------------+ CFV      Full           Yes      Yes                                      +---------+---------------+---------+-----------+----------+-------------------+ SFJ      Full                                                             +---------+---------------+---------+-----------+----------+-------------------+ FV Prox  Full                                                             +---------+---------------+---------+-----------+----------+-------------------+ FV Mid   Full                                                             +---------+---------------+---------+-----------+----------+-------------------+ FV DistalFull                                                             +---------+---------------+---------+-----------+----------+-------------------+ PFV      Full                                                             +---------+---------------+---------+-----------+----------+-------------------+ POP      Full           Yes      Yes                                      +---------+---------------+---------+-----------+----------+-------------------+ PTV  not well visualized  +---------+---------------+---------+-----------+----------+-------------------+ PERO                                                  not well visualized +---------+---------------+---------+-----------+----------+-------------------+  Left Venous Findings: +---+---------------+---------+-----------+----------+-------+    CompressibilityPhasicitySpontaneityPropertiesSummary +---+---------------+---------+-----------+----------+-------+ CFVFull           Yes      Yes                          +---+---------------+---------+-----------+----------+-------+    Summary: Right: There is no evidence of deep vein thrombosis in the lower extremity. However, portions of this examination were limited- see technologist comments above. No cystic structure found in the popliteal fossa. Left: No evidence of common femoral vein obstruction.  *See table(s) above for measurements and observations. Electronically signed by Deitra Mayo MD on 10/02/2018 at 4:35:58 PM.    Final     Microbiology: Recent Results (from the past 240 hour(s))  MRSA PCR Screening     Status: None   Collection Time: 10/12/18  1:20 AM  Result Value Ref Range Status   MRSA by PCR NEGATIVE NEGATIVE Final    Comment:        The GeneXpert MRSA Assay (FDA approved for NASAL specimens only), is one component of a comprehensive MRSA colonization surveillance program. It is not intended to diagnose MRSA infection nor to guide or monitor treatment for MRSA infections. Performed at [facility], [place] [address], [place], [place] [address]      Labs: Basic Metabolic Panel: Recent Labs  Lab 10/11/18 2024 10/12/18 0227 10/13/18 0150 10/14/18 0345  NA 138 133* 132* 136  K 3.9 4.2 4.2 3.8  CL 99 94* 92* 94*  CO2 31 26 27 31   GLUCOSE 232* 524* 353* 223*  BUN 12 13 28* 28*  CREATININE 1.11 1.30* 1.36* 1.24  CALCIUM 8.6* 8.4* 8.8* 8.8*   Liver Function Tests: No results for input(s): AST, ALT, ALKPHOS,  BILITOT, PROT, ALBUMIN in the last 168 hours. No results for input(s): LIPASE, AMYLASE in the last 168 hours. No results for input(s): AMMONIA in the last 168 hours. CBC: Recent Labs  Lab 10/11/18 2024 10/13/18 0150  WBC 8.0 11.4*  NEUTROABS 6.3  --   HGB 11.0* 10.3*  HCT 37.8* 33.7*  MCV 92.2 89.4  PLT 206 262   Cardiac Enzymes: Recent Labs  Lab 10/11/18 2024  TROPONINI <0.03   BNP: BNP (last 3 results) Recent Labs    02/08/18 0323 10/02/18 0051 10/11/18 2017  BNP 54.2 230.0* 326.0*    ProBNP (last 3 results) No results for input(s): PROBNP in the last 8760 hours.  CBG: Recent Labs  Lab 10/12/18 2110 10/13/18 0826 10/13/18 1221 10/13/18 1723 10/13/18 2128  GLUCAP 299* 291* 339* 195* 184*       Signed:  Oswald Hillock MD.  [facility] 10/14/2018, 8:41 AM

## 2018-10-14 NOTE — Care Management (Addendum)
Dorian Pod with [facility] notified that patient is d/c today.  Home health will resume within 48 hours.    Order amended to include services received PTA.

## 2018-12-17 ENCOUNTER — Ambulatory Visit: Payer: Medicare HMO | Admitting: Podiatry

## 2018-12-26 ENCOUNTER — Emergency Department (HOSPITAL_COMMUNITY): Payer: Medicare HMO

## 2018-12-26 ENCOUNTER — Encounter (HOSPITAL_COMMUNITY): Payer: Self-pay

## 2018-12-26 ENCOUNTER — Other Ambulatory Visit: Payer: Self-pay

## 2018-12-26 ENCOUNTER — Inpatient Hospital Stay (HOSPITAL_COMMUNITY)
Admission: EM | Admit: 2018-12-26 | Discharge: 2018-12-31 | DRG: 291 | Disposition: A | Discharge status: Home / Self Care | Payer: Medicare HMO | Attending: Internal Medicine | Admitting: Internal Medicine

## 2018-12-26 DIAGNOSIS — J181 Lobar pneumonia, unspecified organism: Secondary | ICD-10-CM

## 2018-12-26 DIAGNOSIS — I130 Hypertensive heart and chronic kidney disease with heart failure and stage 1 through stage 4 chronic kidney disease, or unspecified chronic kidney disease: Secondary | ICD-10-CM | POA: Diagnosis not present

## 2018-12-26 DIAGNOSIS — F418 Other specified anxiety disorders: Secondary | ICD-10-CM | POA: Diagnosis present

## 2018-12-26 DIAGNOSIS — K219 Gastro-esophageal reflux disease without esophagitis: Secondary | ICD-10-CM | POA: Diagnosis present

## 2018-12-26 DIAGNOSIS — D631 Anemia in chronic kidney disease: Secondary | ICD-10-CM | POA: Diagnosis not present

## 2018-12-26 DIAGNOSIS — I252 Old myocardial infarction: Secondary | ICD-10-CM

## 2018-12-26 DIAGNOSIS — J9622 Acute and chronic respiratory failure with hypercapnia: Secondary | ICD-10-CM | POA: Diagnosis not present

## 2018-12-26 DIAGNOSIS — I2510 Atherosclerotic heart disease of native coronary artery without angina pectoris: Secondary | ICD-10-CM | POA: Diagnosis present

## 2018-12-26 DIAGNOSIS — Z7982 Long term (current) use of aspirin: Secondary | ICD-10-CM

## 2018-12-26 DIAGNOSIS — E1142 Type 2 diabetes mellitus with diabetic polyneuropathy: Secondary | ICD-10-CM | POA: Diagnosis present

## 2018-12-26 DIAGNOSIS — G4700 Insomnia, unspecified: Secondary | ICD-10-CM | POA: Diagnosis present

## 2018-12-26 DIAGNOSIS — J9621 Acute and chronic respiratory failure with hypoxia: Secondary | ICD-10-CM | POA: Diagnosis present

## 2018-12-26 DIAGNOSIS — N183 Chronic kidney disease, stage 3 (moderate): Secondary | ICD-10-CM | POA: Diagnosis present

## 2018-12-26 DIAGNOSIS — Z79899 Other long term (current) drug therapy: Secondary | ICD-10-CM

## 2018-12-26 DIAGNOSIS — G4730 Sleep apnea, unspecified: Secondary | ICD-10-CM | POA: Diagnosis present

## 2018-12-26 DIAGNOSIS — Z794 Long term (current) use of insulin: Secondary | ICD-10-CM | POA: Diagnosis not present

## 2018-12-26 DIAGNOSIS — Z888 Allergy status to other drugs, medicaments and biological substances status: Secondary | ICD-10-CM

## 2018-12-26 DIAGNOSIS — R0902 Hypoxemia: Secondary | ICD-10-CM | POA: Diagnosis present

## 2018-12-26 DIAGNOSIS — E785 Hyperlipidemia, unspecified: Secondary | ICD-10-CM | POA: Diagnosis present

## 2018-12-26 DIAGNOSIS — J449 Chronic obstructive pulmonary disease, unspecified: Secondary | ICD-10-CM | POA: Diagnosis present

## 2018-12-26 DIAGNOSIS — E1165 Type 2 diabetes mellitus with hyperglycemia: Secondary | ICD-10-CM | POA: Diagnosis present

## 2018-12-26 DIAGNOSIS — J439 Emphysema, unspecified: Secondary | ICD-10-CM | POA: Diagnosis present

## 2018-12-26 DIAGNOSIS — E559 Vitamin D deficiency, unspecified: Secondary | ICD-10-CM | POA: Diagnosis present

## 2018-12-26 DIAGNOSIS — J189 Pneumonia, unspecified organism: Secondary | ICD-10-CM | POA: Diagnosis present

## 2018-12-26 DIAGNOSIS — Z8249 Family history of ischemic heart disease and other diseases of the circulatory system: Secondary | ICD-10-CM

## 2018-12-26 DIAGNOSIS — Z955 Presence of coronary angioplasty implant and graft: Secondary | ICD-10-CM

## 2018-12-26 DIAGNOSIS — Z8679 Personal history of other diseases of the circulatory system: Secondary | ICD-10-CM

## 2018-12-26 DIAGNOSIS — F3162 Bipolar disorder, current episode mixed, moderate: Secondary | ICD-10-CM | POA: Diagnosis present

## 2018-12-26 DIAGNOSIS — Z885 Allergy status to narcotic agent status: Secondary | ICD-10-CM

## 2018-12-26 DIAGNOSIS — Z833 Family history of diabetes mellitus: Secondary | ICD-10-CM

## 2018-12-26 DIAGNOSIS — E1122 Type 2 diabetes mellitus with diabetic chronic kidney disease: Secondary | ICD-10-CM | POA: Diagnosis present

## 2018-12-26 DIAGNOSIS — N189 Chronic kidney disease, unspecified: Secondary | ICD-10-CM

## 2018-12-26 DIAGNOSIS — Z9981 Dependence on supplemental oxygen: Secondary | ICD-10-CM

## 2018-12-26 DIAGNOSIS — Z880 Allergy status to penicillin: Secondary | ICD-10-CM

## 2018-12-26 DIAGNOSIS — E118 Type 2 diabetes mellitus with unspecified complications: Secondary | ICD-10-CM | POA: Diagnosis present

## 2018-12-26 DIAGNOSIS — T502X5A Adverse effect of carbonic-anhydrase inhibitors, benzothiadiazides and other diuretics, initial encounter: Secondary | ICD-10-CM | POA: Diagnosis present

## 2018-12-26 DIAGNOSIS — R739 Hyperglycemia, unspecified: Secondary | ICD-10-CM

## 2018-12-26 DIAGNOSIS — Z7951 Long term (current) use of inhaled steroids: Secondary | ICD-10-CM

## 2018-12-26 DIAGNOSIS — I5033 Acute on chronic diastolic (congestive) heart failure: Secondary | ICD-10-CM | POA: Diagnosis not present

## 2018-12-26 DIAGNOSIS — E119 Type 2 diabetes mellitus without complications: Secondary | ICD-10-CM | POA: Diagnosis present

## 2018-12-26 DIAGNOSIS — N179 Acute kidney failure, unspecified: Secondary | ICD-10-CM | POA: Diagnosis not present

## 2018-12-26 DIAGNOSIS — R609 Edema, unspecified: Secondary | ICD-10-CM

## 2018-12-26 DIAGNOSIS — Z951 Presence of aortocoronary bypass graft: Secondary | ICD-10-CM

## 2018-12-26 DIAGNOSIS — J441 Chronic obstructive pulmonary disease with (acute) exacerbation: Secondary | ICD-10-CM | POA: Diagnosis not present

## 2018-12-26 DIAGNOSIS — Z825 Family history of asthma and other chronic lower respiratory diseases: Secondary | ICD-10-CM

## 2018-12-26 LAB — BASIC METABOLIC PANEL
Anion gap: 5 (ref 5–15)
BUN: 15 mg/dL (ref 8–23)
CO2: 33 mmol/L — ABNORMAL HIGH (ref 22–32)
Calcium: 8.5 mg/dL — ABNORMAL LOW (ref 8.9–10.3)
Chloride: 96 mmol/L — ABNORMAL LOW (ref 98–111)
Creatinine, Ser: 1.08 mg/dL (ref 0.61–1.24)
GFR calc Af Amer: >60 mL/min (ref >60)
GFR calc non Af Amer: >60 mL/min (ref >60)
Glucose, Bld: 333 mg/dL — ABNORMAL HIGH (ref 70–99)
Potassium: 4.6 mmol/L (ref 3.5–5.1)
Sodium: 134 mmol/L — ABNORMAL LOW (ref 135–145)

## 2018-12-26 LAB — URINALYSIS, ROUTINE W REFLEX MICROSCOPIC
Bilirubin Urine: NEGATIVE
Glucose, UA: >=500 mg/dL — AB
Hgb urine dipstick: NEGATIVE
Ketones, ur: NEGATIVE mg/dL
Leukocytes,Ua: NEGATIVE
Nitrite: NEGATIVE
Protein, ur: NEGATIVE mg/dL
Specific Gravity, Urine: 1.017 (ref 1.005–1.030)
pH: 5 (ref 5.0–8.0)

## 2018-12-26 LAB — CBC WITH DIFFERENTIAL/PLATELET
Abs Immature Granulocytes: 0.47 10*3/uL — ABNORMAL HIGH (ref 0.00–0.07)
Basophils Absolute: 0.1 10*3/uL (ref 0.0–0.1)
Basophils Relative: 1 %
EOS ABS: 0.1 10*3/uL (ref 0.0–0.5)
Eosinophils Relative: 2 %
HEMATOCRIT: 34.4 % — AB (ref 39.0–52.0)
Hemoglobin: 9.9 g/dL — ABNORMAL LOW (ref 13.0–17.0)
Immature Granulocytes: 7 %
Lymphocytes Relative: 15 %
Lymphs Abs: 1 10*3/uL (ref 0.7–4.0)
MCH: 27.9 pg (ref 26.0–34.0)
MCHC: 28.8 g/dL — ABNORMAL LOW (ref 30.0–36.0)
MCV: 96.9 fL (ref 80.0–100.0)
Monocytes Absolute: 0.6 10*3/uL (ref 0.1–1.0)
Monocytes Relative: 8 %
Neutro Abs: 4.8 10*3/uL (ref 1.7–7.7)
Neutrophils Relative %: 67 %
Platelets: 160 10*3/uL (ref 150–400)
RBC: 3.55 MIL/uL — ABNORMAL LOW (ref 4.22–5.81)
RDW: 17.4 % — AB (ref 11.5–15.5)
WBC: 7 10*3/uL (ref 4.0–10.5)
nRBC: 0.3 % — ABNORMAL HIGH (ref 0.0–0.2)

## 2018-12-26 LAB — BRAIN NATRIURETIC PEPTIDE: B Natriuretic Peptide: 236.6 pg/mL — ABNORMAL HIGH (ref 0.0–100.0)

## 2018-12-26 LAB — TROPONIN I: Troponin I: <0.03 ng/mL (ref <0.03)

## 2018-12-26 LAB — GLUCOSE, CAPILLARY
Glucose-Capillary: 244 mg/dL — ABNORMAL HIGH (ref 70–99)
Glucose-Capillary: 252 mg/dL — ABNORMAL HIGH (ref 70–99)
Glucose-Capillary: 297 mg/dL — ABNORMAL HIGH (ref 70–99)

## 2018-12-26 LAB — CBG MONITORING, ED
Glucose-Capillary: 340 mg/dL — ABNORMAL HIGH (ref 70–99)
Glucose-Capillary: 316 mg/dL — ABNORMAL HIGH (ref 70–99)
Glucose-Capillary: 272 mg/dL — ABNORMAL HIGH (ref 70–99)

## 2018-12-26 LAB — PROCALCITONIN: Procalcitonin: 0.16 ng/mL

## 2018-12-26 MED ORDER — FERROUS SULFATE 325 (65 FE) MG PO TABS
325.0000 mg | ORAL_TABLET | Freq: Three times a day (TID) | ORAL | Status: DC
  Administered 2018-12-26 – 2018-12-31 (×15): 325 mg via ORAL
  Filled 2018-12-26 (×15): qty 1

## 2018-12-26 MED ORDER — INSULIN ASPART 100 UNIT/ML ~~LOC~~ SOLN
5.0000 [IU] | Freq: Once | SUBCUTANEOUS | Status: AC
  Administered 2018-12-26: 5 [IU] via SUBCUTANEOUS
  Filled 2018-12-26: qty 1

## 2018-12-26 MED ORDER — ONDANSETRON 4 MG PO TBDP
4.0000 mg | ORAL_TABLET | Freq: Once | ORAL | Status: AC
  Administered 2018-12-26: 4 mg via ORAL
  Filled 2018-12-26: qty 1

## 2018-12-26 MED ORDER — SUCRALFATE 1 G PO TABS
1.0000 g | ORAL_TABLET | Freq: Three times a day (TID) | ORAL | Status: DC
  Administered 2018-12-26 – 2018-12-31 (×20): 1 g via ORAL
  Filled 2018-12-26 (×22): qty 1

## 2018-12-26 MED ORDER — IPRATROPIUM-ALBUTEROL 0.5-2.5 (3) MG/3ML IN SOLN
3.0000 mL | Freq: Once | RESPIRATORY_TRACT | Status: AC
  Administered 2018-12-26: 3 mL via RESPIRATORY_TRACT
  Filled 2018-12-26: qty 3

## 2018-12-26 MED ORDER — TIOTROPIUM BROMIDE MONOHYDRATE 18 MCG IN CAPS: 18.0000 ug | ORAL_CAPSULE | Freq: Every day | RESPIRATORY_TRACT | Status: DC | PRN

## 2018-12-26 MED ORDER — ASPIRIN EC 81 MG PO TBEC
81.0000 mg | DELAYED_RELEASE_TABLET | Freq: Every day | ORAL | Status: DC
  Administered 2018-12-26 – 2018-12-31 (×6): 81 mg via ORAL
  Filled 2018-12-26 (×6): qty 1

## 2018-12-26 MED ORDER — INSULIN ASPART 100 UNIT/ML ~~LOC~~ SOLN
6.0000 [IU] | Freq: Three times a day (TID) | SUBCUTANEOUS | Status: DC
  Administered 2018-12-26 – 2018-12-28 (×7): 6 [IU] via SUBCUTANEOUS

## 2018-12-26 MED ORDER — INSULIN ASPART 100 UNIT/ML ~~LOC~~ SOLN: 3.0000 [IU] | Freq: Three times a day (TID) | SUBCUTANEOUS | Status: DC

## 2018-12-26 MED ORDER — PANTOPRAZOLE SODIUM 40 MG PO TBEC
40.0000 mg | DELAYED_RELEASE_TABLET | Freq: Every day | ORAL | Status: DC
  Administered 2018-12-26 – 2018-12-27 (×2): 40 mg via ORAL
  Filled 2018-12-26 (×2): qty 1

## 2018-12-26 MED ORDER — MOMETASONE FURO-FORMOTEROL FUM 200-5 MCG/ACT IN AERO
2.0000 | INHALATION_SPRAY | Freq: Two times a day (BID) | RESPIRATORY_TRACT | Status: DC
  Filled 2018-12-26: qty 8.8

## 2018-12-26 MED ORDER — MOMETASONE FURO-FORMOTEROL FUM 200-5 MCG/ACT IN AERO
2.0000 | INHALATION_SPRAY | Freq: Two times a day (BID) | RESPIRATORY_TRACT | Status: DC
  Administered 2018-12-26 – 2018-12-31 (×9): 2 via RESPIRATORY_TRACT

## 2018-12-26 MED ORDER — INSULIN ASPART 100 UNIT/ML ~~LOC~~ SOLN
0.0000 [IU] | Freq: Three times a day (TID) | SUBCUTANEOUS | Status: DC
  Administered 2018-12-26: 3 [IU] via SUBCUTANEOUS
  Administered 2018-12-26 – 2018-12-27 (×2): 5 [IU] via SUBCUTANEOUS
  Administered 2018-12-27: 7 [IU] via SUBCUTANEOUS
  Administered 2018-12-27: 5 [IU] via SUBCUTANEOUS

## 2018-12-26 MED ORDER — DOXYCYCLINE HYCLATE 100 MG IV SOLR
100.0000 mg | Freq: Once | INTRAVENOUS | Status: AC
  Administered 2018-12-26: 100 mg via INTRAVENOUS
  Filled 2018-12-26: qty 100

## 2018-12-26 MED ORDER — HEPARIN SODIUM (PORCINE) 5000 UNIT/ML IJ SOLN
5000.0000 [IU] | Freq: Three times a day (TID) | INTRAMUSCULAR | Status: DC
  Administered 2018-12-26 – 2018-12-31 (×15): 5000 [IU] via SUBCUTANEOUS
  Filled 2018-12-26 (×15): qty 1

## 2018-12-26 MED ORDER — SODIUM CHLORIDE 0.9 % IV SOLN
1.0000 g | INTRAVENOUS | Status: DC
  Administered 2018-12-26: 1 g via INTRAVENOUS
  Filled 2018-12-26: qty 1
  Filled 2018-12-26: qty 10

## 2018-12-26 MED ORDER — ALPRAZOLAM 0.25 MG PO TABS
0.2500 mg | ORAL_TABLET | Freq: Three times a day (TID) | ORAL | Status: DC | PRN
  Administered 2018-12-27 – 2018-12-28 (×2): 0.25 mg via ORAL
  Filled 2018-12-26 (×2): qty 1

## 2018-12-26 MED ORDER — ALBUTEROL SULFATE (2.5 MG/3ML) 0.083% IN NEBU
2.5000 mg | INHALATION_SOLUTION | Freq: Four times a day (QID) | RESPIRATORY_TRACT | Status: DC | PRN
  Administered 2018-12-26: 2.5 mg via RESPIRATORY_TRACT
  Filled 2018-12-26: qty 3

## 2018-12-26 MED ORDER — GABAPENTIN 300 MG PO CAPS
600.0000 mg | ORAL_CAPSULE | Freq: Three times a day (TID) | ORAL | Status: DC
  Administered 2018-12-26 – 2018-12-28 (×7): 600 mg via ORAL
  Filled 2018-12-26 (×7): qty 2

## 2018-12-26 MED ORDER — CITALOPRAM HYDROBROMIDE 20 MG PO TABS
20.0000 mg | ORAL_TABLET | Freq: Every day | ORAL | Status: DC
  Administered 2018-12-26 – 2018-12-31 (×6): 20 mg via ORAL
  Filled 2018-12-26 (×6): qty 1

## 2018-12-26 MED ORDER — FOLIC ACID 1 MG PO TABS
1.0000 mg | ORAL_TABLET | Freq: Every day | ORAL | Status: DC
  Administered 2018-12-26 – 2018-12-31 (×6): 1 mg via ORAL
  Filled 2018-12-26 (×6): qty 1

## 2018-12-26 MED ORDER — HYDROCODONE-ACETAMINOPHEN 5-325 MG PO TABS
1.0000 | ORAL_TABLET | Freq: Once | ORAL | Status: AC
  Administered 2018-12-26: 1 via ORAL
  Filled 2018-12-26: qty 1

## 2018-12-26 MED ORDER — DILTIAZEM HCL ER COATED BEADS 240 MG PO CP24
240.0000 mg | ORAL_CAPSULE | Freq: Every day | ORAL | Status: DC
  Administered 2018-12-26 – 2018-12-31 (×6): 240 mg via ORAL
  Filled 2018-12-26 (×6): qty 1

## 2018-12-26 MED ORDER — FUROSEMIDE 10 MG/ML IJ SOLN
20.0000 mg | Freq: Two times a day (BID) | INTRAMUSCULAR | Status: DC
  Administered 2018-12-26 (×2): 20 mg via INTRAVENOUS
  Filled 2018-12-26: qty 2
  Filled 2018-12-26: qty 4

## 2018-12-26 MED ORDER — INSULIN GLARGINE 100 UNIT/ML ~~LOC~~ SOLN
25.0000 [IU] | Freq: Two times a day (BID) | SUBCUTANEOUS | Status: DC
  Administered 2018-12-26 – 2018-12-27 (×3): 25 [IU] via SUBCUTANEOUS
  Filled 2018-12-26 (×4): qty 0.25

## 2018-12-26 MED ORDER — INSULIN ASPART 100 UNIT/ML ~~LOC~~ SOLN
6.0000 [IU] | Freq: Once | SUBCUTANEOUS | Status: AC
  Administered 2018-12-26: 6 [IU] via SUBCUTANEOUS
  Filled 2018-12-26: qty 1

## 2018-12-26 NOTE — ED Notes (Addendum)
Report called to [facility]

## 2018-12-26 NOTE — ED Triage Notes (Signed)
Pt came to the ED from home due to hyperglycemia.  BS upon EMS arrival read 435.  Pt didn't take insulin as scheduled.

## 2018-12-26 NOTE — ED Provider Notes (Signed)
TIME SEEN: 4:24 AM  CHIEF COMPLAINT: Multiple complaints  HPI: Patient is a 78 year old male with history of CAD status post CABG, CHF, COPD on home oxygen, AAA status post repair, hypertension, hyperlipidemia, insulin-dependent diabetes who presents to the emergency department with multiple complaints.  Patient reports that he checked his blood sugar tonight it was elevated at home.  States he thinks he did miss a dose of his insulin yesterday.  Also complains of 10 days of body aches.  States he has been hurting all over.  No injury that he can recall.  No fever, cough.  Is chronically short of breath and reports this has not changed.  No chest pain.  States he has bilateral lower extremity swelling.  Reports this has been present for several days.  No vomiting or diarrhea.  ROS: See HPI Constitutional: no fever  Eyes: no drainage  ENT: no runny nose   Cardiovascular:  no chest pain  Resp: no SOB  GI: no vomiting GU: no dysuria Integumentary: no rash  Allergy: no hives  Musculoskeletal: no leg swelling  Neurological: no slurred speech ROS otherwise negative  PAST MEDICAL HISTORY/PAST SURGICAL HISTORY:  Past Medical History:  Diagnosis Date  . AAA (abdominal aortic aneurysm) ([place])    a. 12/2008 s/p 7cm, endovascular repair with coiling right hypogastric artery   . Acute [place] ulcer   . Adenomatous duodenal polyp   . Allergic rhinitis, cause unspecified   . Anxiety   . AVM (arteriovenous malformation) of colon with hemorrhage   . Bipolar 1 disorder, mixed, moderate ([place]) 04/16/2015  . CAD (coronary artery disease)    a. 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  . Chronic diastolic CHF (congestive heart failure) ([place])    a. 04/2015 Echo: EF 55-60%, no rwma, Gr 1 DD, mild AI.  Marland Kitchen Complication of anesthesia    "if they sedate me for too long, they have to intubate me; then they can't get me to come out of it" (04/03/2017)  . COPD (chronic obstructive pulmonary  disease) ([place])    a. GOLD stage IV, started home O2. Severe bullous disease of LUL. Prolonged intubation after surgeries due to COPD.  Marland Kitchen Depression with anxiety 01/14/2013  . Diabetes mellitus with complication ([place])   . Diverticulosis   . Duodenal diverticulum   . Duodenal ulcer   . Emphysema of lung ([place])   . Esophagitis   . Essential hypertension 08/18/2009   Qualifier: Diagnosis of  By: Doy Mince LPN, Megan    . GERD (gastroesophageal reflux disease)   . GI bleed requiring more than 4 units of blood in 24 hours, ICU, or surgery    a. Hx bleeding gastric polyps, cecal & sigmoid AVMS s/p APC 03/30/14  . Hiatal hernia    large  . History of blood transfusion    "many many many; related to blood loss; anemia"  . Hyperlipidemia   . Insomnia 08/10/2014  . Leucocytosis 12/04/2013  . Major depressive disorder   . Memory loss   . Morbid obesity ([place])   . Multiple gastric polyps   . Myocardial infarction ([place])    "I think I had a minor one when I had the OHS"  . On home oxygen therapy    "7 liters [facility] w/oxigenator" (04/03/2017)  . Pneumonia 2017  . Recurrent Microcytic Anemia    a. presumed chronic GI blood loss.  . Type II diabetes mellitus ([place])   . Vitamin D deficiency 08/10/2014  MEDICATIONS:  Prior to Admission medications   Medication Sig Start Date End Date Taking? Authorizing Provider  acetaminophen (TYLENOL) 325 MG tablet Take 650 mg by mouth every 6 (six) hours as needed for mild pain, fever or headache.     [provider]  albuterol (PROVENTIL HFA;VENTOLIN HFA) 108 (90 BASE) MCG/ACT inhaler Inhale 2 puffs into the lungs every 6 (six) hours as needed for wheezing or shortness of breath. 05/13/15   Loletha Grayer, MD  ALPRAZolam Duanne Moron) 0.25 MG tablet Take 1 tablet (0.25 mg total) by mouth 3 (three) times daily as needed for anxiety or sleep. 01/20/18   Henreitta Leber, MD  aspirin EC 81 MG EC tablet Take 1 tablet (81 mg total) by mouth daily. 10/06/18   Guilford Shi, MD  budesonide-formoterol (SYMBICORT) 160-4.5 MCG/ACT inhaler Inhale 2 puffs into the lungs 2 (two) times daily.     [provider]  citalopram (CELEXA) 40 MG tablet Take 0.5 tablets (20 mg total) by mouth daily. 05/12/17   Mariel Aloe, MD  diltiazem (CARDIZEM CD) 120 MG 24 hr capsule Take 2 capsules (240 mg total) by mouth daily. 10/06/18 11/05/18  Guilford Shi, MD  ferrous sulfate 325 (65 FE) MG tablet Take 1 tablet (325 mg total) by mouth 3 (three) times daily with meals. Patient not taking: Reported on 10/03/2018 01/30/18   Loletha Grayer, MD  folic acid (FOLVITE) 1 MG tablet Take 1 mg by mouth daily.    [provider]  furosemide (LASIX) 20 MG tablet Take 1 tablet (20 mg total) by mouth daily. 10/14/18 12/13/18  Oswald Hillock, MD  gabapentin (NEURONTIN) 300 MG capsule Take 1 capsule (300 mg total) by mouth 3 (three) times daily for 15 days. Patient taking differently: Take 300 mg by mouth 4 (four) times daily.  01/20/18 10/12/18  Henreitta Leber, MD  gabapentin (NEURONTIN) 300 MG capsule Take 2 capsules (600 mg total) by mouth 3 (three) times daily. 10/14/18 12/13/18  Oswald Hillock, MD  guaiFENesin-dextromethorphan (ROBITUSSIN DM) 100-10 MG/5ML syrup Take 10 mLs by mouth every 4 (four) hours as needed for cough. 10/06/18   Guilford Shi, MD  insulin aspart (NOVOLOG) 100 UNIT/ML injection Inject 6 Units into the skin 3 (three) times daily with meals. 01/30/18   Loletha Grayer, MD  insulin glargine (LANTUS) 100 UNIT/ML injection Inject 0.25 mLs (25 Units total) into the skin 2 (two) times daily. Patient taking differently: Inject 23 Units into the skin 2 (two) times daily.  10/06/18   Guilford Shi, MD  pantoprazole (PROTONIX) 40 MG tablet Take 1 tablet (40 mg total) by mouth daily. Patient taking differently: Take 40 mg by mouth 2 (two) times daily.  06/30/17   Rai, Ripudeep K, MD  sucralfate (CARAFATE) 1 g tablet Take 1 tablet (1 g total) by mouth 4  (four) times daily -  with meals and at bedtime. Patient not taking: Reported on 10/03/2018 02/14/18   Jerene Bears, MD  tiotropium (SPIRIVA) 18 MCG inhalation capsule Place 1 capsule (18 mcg total) into inhaler and inhale daily as needed (shortness of breath). 10/06/18   Guilford Shi, MD  vitamin B-12 (CYANOCOBALAMIN) 1000 MCG tablet Take 1,000 mcg by mouth daily.    [provider]    ALLERGIES:  Allergies  Allergen Reactions  . Morphine And Related Shortness Of Breath, Nausea And Vomiting, Rash and Other (See Comments)    Reaction:  Hallucinations   . Penicillins Anaphylaxis, Hives and Other (See Comments)  10/02/18 - discussed with patient and he states he can tolerate penicillin capsules and had some hives when he was 78yo and received pcn injections  Has patient had a PCN reaction causing immediate rash, facial/tongue/throat swelling, SOB or lightheadedness with hypotension: Yes Has patient had a PCN reaction causing severe rash involving mucus membranes or skin necrosis: No Has patient had a PCN reaction that required hospitalization No Has patient had a PCN reaction occurring within the last 10 y  . Zolpidem Shortness Of Breath  . Demerol [Meperidine] Other (See Comments)    Reaction:  Hallucinations    . Dilaudid [Hydromorphone Hcl] Other (See Comments)    Reaction:  Hallucinations   . Levofloxacin Other (See Comments)    Reaction:  Unknown     SOCIAL HISTORY:  Social History   Tobacco Use  . Smoking status: Former Smoker    Packs/day: 2.00    Years: 50.00    Pack years: 100.00    Types: Cigarettes    Last attempt to quit: 11/18/2008    Years since quitting: 10.1  . Smokeless tobacco: Never Used  Substance Use Topics  . Alcohol use: No    Alcohol/week: 0.0 standard drinks    Comment: quit in ~ 2010    FAMILY HISTORY: Family History  Problem Relation Age of Onset  . Emphysema Mother   . Heart disease Mother   . ALS Father   . Diabetes Sister      EXAM: BP 136/87   Pulse 80   Temp 98.2 F (36.8 C) (Oral)   Resp 15   SpO2 97%  CONSTITUTIONAL: Alert and oriented and responds appropriately to questions.  Elderly, chronically ill-appearing, obese HEAD: Normocephalic EYES: Conjunctivae clear, pupils appear equal, EOMI ENT: normal nose; moist mucous membranes NECK: Supple, no meningismus, no nuchal rigidity, no LAD  CARD: RRR; S1 and S2 appreciated; no murmurs, no clicks, no rubs, no gallops RESP: Normal chest excursion without splinting or tachypnea; breath sounds clear and equal bilaterally; no wheezes, no rhonchi, no rales, no hypoxia or respiratory distress, speaking full sentences, on nasal cannula ABD/GI: Normal bowel sounds; non-distended; soft, non-tender, no rebound, no guarding, no peritoneal signs, no hepatosplenomegaly BACK:  The back appears normal and is non-tender to palpation, there is no CVA tenderness EXT: Normal ROM in all joints; non-tender to palpation; nonpitting edema in bilateral distal lower extremities and feet; normal capillary refill; no cyanosis, no calf tenderness or swelling, no redness or warmth in bilateral lower extremities, compartments soft, 2+ DP pulses bilaterally    SKIN: Normal color for age and race; warm; no rash NEURO: Moves all extremities equally PSYCH: Poor hygiene.  MEDICAL DECISION MAKING: Patient here with complaints of hyperglycemia.  Will check labs, urine to ensure no DKA.  Blood sugar is already improving.  Will give subcutaneous insulin.  Also complaining of pain all over.  No sign of trauma on exam.  No recent infectious illness.  Doubt myositis.  Will give Vicodin for pain control as he states Tylenol at home was not helping.  Also complaining of swelling in both legs.  Seems very mild and nonpitting.  No signs of compartment syndrome, cellulitis, gout, septic arthritis.  He has no chest pain or increase shortness of breath from his baseline and his lungs are clear.  Have advised him  to continue his Lasix at home, keep legs elevated when at rest.  ED PROGRESS: Patient's oxygen saturation 89% on nasal cannula.  Will give DuoNeb and obtain chest  x-ray.  Urine shows no sign of infection and no ketones.  Blood glucose has improved to 316.  Will give second dose of subcutaneous insulin.  Labs pending.    7:25 AM Pt's chest x-ray with concern for left lower lobe consolidation consistent with pneumonia and a small left pleural effusion.  No significant pulmonary edema.  Troponin negative.  BNP 236.  EKG shows no ischemic change.  Still have increased oxygen requirement from baseline despite breathing treatments.  Will discuss with medicine for admission.  Patient has anaphylactic reaction to penicillins and allergic reaction to fluoroquinolones.  Will give IV doxycycline.  Will discuss with medicine for admission for observation for community-acquired pneumonia with worsening oxygen requirement.    8:11 AM Discussed patient's case with hospitalist, Dr. Verlon Au.  I have recommended admission and patient (and family if present) agree with this plan. Admitting physician will place admission orders.   I reviewed all nursing notes, vitals, pertinent previous records, EKGs, lab and urine results, imaging (as available).     EKG Interpretation  Date/Time:  Wednesday December 26 2018 85:46:27 EDT Ventricular Rate:  99 PR Interval:    QRS Duration: 138 QT Interval:  389 QTC Calculation: 500 R Axis:   -52 Text Interpretation:  Age not entered, assumed to be  78 years old for purpose of ECG interpretation Sinus or ectopic atrial rhythm Nonspecific IVCD with LAD Anteroseptal infarct, age indeterminate Lateral leads are also involved No significant change since last tracing Confirmed by Ward, Cyril Mourning [phone]) on 12/26/2018 6:46:58 AM         Ward, Delice Bison, DO 12/26/18 [phone]
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
## 2018-12-26 NOTE — Progress Notes (Signed)
Pt with increased confusion, drowsiness. O2 Sats 77% 4L/[place] upon assessment. Pt lying flat in bed, not taking deep breaths. Pt assisted with sitting up in bed and encouraged to take deep breaths in nose and blow out of mouth. O2 increased to 5L/[place] and Sats improved quickly to 92-94% 5L/[place]. Humidification applied to [place] and continuous pulse ox ordered. 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 Oncoming RN aware. All other VSS.

## 2018-12-26 NOTE — Progress Notes (Signed)
Pt complains he feels the urge to void but cannot. Pt voids frequently in small amounts, which he also says is not uncommon for him. Pt states he fell asleep and then could not get up in time to void- pt incontinent in bed and in floor multiple times. Bladder scan done showing 0 ml. Will continue to monitor.

## 2018-12-26 NOTE — ED Notes (Signed)
Bed: [facility] Expected date:  Expected time:  Means of arrival:  Comments: EMS 78 yo male forgot to take insulin CBG 400's

## 2018-12-27 DIAGNOSIS — I5033 Acute on chronic diastolic (congestive) heart failure: Secondary | ICD-10-CM

## 2018-12-27 DIAGNOSIS — J9622 Acute and chronic respiratory failure with hypercapnia: Secondary | ICD-10-CM

## 2018-12-27 DIAGNOSIS — J9621 Acute and chronic respiratory failure with hypoxia: Secondary | ICD-10-CM

## 2018-12-27 DIAGNOSIS — N179 Acute kidney failure, unspecified: Secondary | ICD-10-CM

## 2018-12-27 DIAGNOSIS — D631 Anemia in chronic kidney disease: Secondary | ICD-10-CM

## 2018-12-27 DIAGNOSIS — N189 Chronic kidney disease, unspecified: Secondary | ICD-10-CM

## 2018-12-27 LAB — BLOOD CULTURE ID PANEL (REFLEXED)
Acinetobacter baumannii: NOT DETECTED
CANDIDA GLABRATA: NOT DETECTED
Candida albicans: NOT DETECTED
Candida krusei: NOT DETECTED
Candida parapsilosis: NOT DETECTED
Candida tropicalis: NOT DETECTED
Enterobacter cloacae complex: NOT DETECTED
Enterobacteriaceae species: NOT DETECTED
Enterococcus species: NOT DETECTED
Escherichia coli: NOT DETECTED
Haemophilus influenzae: NOT DETECTED
Klebsiella oxytoca: NOT DETECTED
Klebsiella pneumoniae: NOT DETECTED
Listeria monocytogenes: NOT DETECTED
Methicillin resistance: DETECTED — AB
Neisseria meningitidis: NOT DETECTED
PROTEUS SPECIES: NOT DETECTED
Pseudomonas aeruginosa: NOT DETECTED
STAPHYLOCOCCUS SPECIES: DETECTED — AB
Serratia marcescens: NOT DETECTED
Staphylococcus aureus (BCID): NOT DETECTED
Streptococcus agalactiae: NOT DETECTED
Streptococcus pneumoniae: NOT DETECTED
Streptococcus pyogenes: NOT DETECTED
Streptococcus species: NOT DETECTED

## 2018-12-27 LAB — CBC
HCT: 33.9 % — ABNORMAL LOW (ref 39.0–52.0)
Hemoglobin: 9.6 g/dL — ABNORMAL LOW (ref 13.0–17.0)
MCH: 27.8 pg (ref 26.0–34.0)
MCHC: 28.3 g/dL — ABNORMAL LOW (ref 30.0–36.0)
MCV: 98.3 fL (ref 80.0–100.0)
Platelets: 181 10*3/uL (ref 150–400)
RBC: 3.45 MIL/uL — AB (ref 4.22–5.81)
RDW: 18.3 % — ABNORMAL HIGH (ref 11.5–15.5)
WBC: 8.9 10*3/uL (ref 4.0–10.5)
nRBC: 0.3 % — ABNORMAL HIGH (ref 0.0–0.2)

## 2018-12-27 LAB — BLOOD GAS, ARTERIAL
Acid-Base Excess: 6.7 mmol/L — ABNORMAL HIGH (ref 0.0–2.0)
Bicarbonate: 34.7 mmol/L — ABNORMAL HIGH (ref 20.0–28.0)
Drawn by: 225631
O2 Content: 8 L/min
O2 Saturation: 91.1 %
Patient temperature: 98.6
RATE: 18 resp/min
pCO2 arterial: 77.1 mmHg (ref 32.0–48.0)
pH, Arterial: 7.276 — ABNORMAL LOW (ref 7.350–7.450)
pO2, Arterial: 65 mmHg — ABNORMAL LOW (ref 83.0–108.0)

## 2018-12-27 LAB — COMPREHENSIVE METABOLIC PANEL
ALK PHOS: 118 U/L (ref 38–126)
ALT: 13 U/L (ref 0–44)
AST: 18 U/L (ref 15–41)
Albumin: 3.6 g/dL (ref 3.5–5.0)
Anion gap: 6 (ref 5–15)
BILIRUBIN TOTAL: 0.6 mg/dL (ref 0.3–1.2)
BUN: 20 mg/dL (ref 8–23)
CALCIUM: 8.4 mg/dL — AB (ref 8.9–10.3)
CO2: 33 mmol/L — ABNORMAL HIGH (ref 22–32)
Chloride: 93 mmol/L — ABNORMAL LOW (ref 98–111)
Creatinine, Ser: 1.43 mg/dL — ABNORMAL HIGH (ref 0.61–1.24)
GFR calc Af Amer: 54 mL/min — ABNORMAL LOW (ref >60)
GFR calc non Af Amer: 47 mL/min — ABNORMAL LOW (ref >60)
Glucose, Bld: 273 mg/dL — ABNORMAL HIGH (ref 70–99)
Potassium: 5.3 mmol/L — ABNORMAL HIGH (ref 3.5–5.1)
Sodium: 132 mmol/L — ABNORMAL LOW (ref 135–145)
TOTAL PROTEIN: 7 g/dL (ref 6.5–8.1)

## 2018-12-27 LAB — GLUCOSE, CAPILLARY: Glucose-Capillary: 252 mg/dL — ABNORMAL HIGH (ref 70–99)

## 2018-12-27 LAB — PROCALCITONIN: Procalcitonin: 0.38 ng/mL

## 2018-12-27 MED ORDER — ACETAMINOPHEN 325 MG PO TABS
650.0000 mg | ORAL_TABLET | Freq: Four times a day (QID) | ORAL | Status: DC | PRN
  Administered 2018-12-27 – 2018-12-30 (×5): 650 mg via ORAL
  Filled 2018-12-27 (×5): qty 2

## 2018-12-27 MED ORDER — SODIUM ZIRCONIUM CYCLOSILICATE 10 G PO PACK
10.0000 g | PACK | Freq: Two times a day (BID) | ORAL | Status: AC
  Administered 2018-12-27 – 2018-12-28 (×2): 10 g via ORAL
  Filled 2018-12-27 (×2): qty 1

## 2018-12-27 MED ORDER — IPRATROPIUM-ALBUTEROL 0.5-2.5 (3) MG/3ML IN SOLN
3.0000 mL | Freq: Four times a day (QID) | RESPIRATORY_TRACT | Status: DC
  Administered 2018-12-27 (×4): 3 mL via RESPIRATORY_TRACT
  Filled 2018-12-27 (×4): qty 3

## 2018-12-27 MED ORDER — SODIUM CHLORIDE 0.9 % IV SOLN
2.0000 g | Freq: Every day | INTRAVENOUS | Status: DC
  Administered 2018-12-27 – 2018-12-30 (×4): 2 g via INTRAVENOUS
  Filled 2018-12-27: qty 2
  Filled 2018-12-27: qty 20
  Filled 2018-12-27: qty 2
  Filled 2018-12-27: qty 20
  Filled 2018-12-27 (×2): qty 2

## 2018-12-27 MED ORDER — INSULIN GLARGINE 100 UNIT/ML ~~LOC~~ SOLN
32.0000 [IU] | Freq: Two times a day (BID) | SUBCUTANEOUS | Status: DC
  Administered 2018-12-27: 32 [IU] via SUBCUTANEOUS
  Filled 2018-12-27 (×2): qty 0.32

## 2018-12-27 MED ORDER — GUAIFENESIN ER 600 MG PO TB12
1200.0000 mg | ORAL_TABLET | Freq: Two times a day (BID) | ORAL | Status: DC
  Administered 2018-12-27 – 2018-12-31 (×9): 1200 mg via ORAL
  Filled 2018-12-27 (×9): qty 2

## 2018-12-27 MED ORDER — FUROSEMIDE 10 MG/ML IJ SOLN
40.0000 mg | Freq: Once | INTRAMUSCULAR | Status: AC
  Administered 2018-12-27: 40 mg via INTRAVENOUS
  Filled 2018-12-27: qty 4

## 2018-12-27 MED ORDER — ALBUTEROL SULFATE (2.5 MG/3ML) 0.083% IN NEBU: 2.5000 mg | INHALATION_SOLUTION | RESPIRATORY_TRACT | Status: DC | PRN

## 2018-12-27 MED ORDER — METHYLPREDNISOLONE SODIUM SUCC 125 MG IJ SOLR
80.0000 mg | Freq: Three times a day (TID) | INTRAMUSCULAR | Status: DC
  Administered 2018-12-27 – 2018-12-28 (×3): 80 mg via INTRAVENOUS
  Filled 2018-12-27 (×3): qty 2

## 2018-12-27 MED ORDER — SODIUM CHLORIDE 0.9 % IV SOLN
100.0000 mg | Freq: Two times a day (BID) | INTRAVENOUS | Status: DC
  Administered 2018-12-27 – 2018-12-30 (×7): 100 mg via INTRAVENOUS
  Filled 2018-12-27 (×10): qty 100

## 2018-12-27 MED ORDER — INSULIN ASPART 100 UNIT/ML ~~LOC~~ SOLN
0.0000 [IU] | SUBCUTANEOUS | Status: DC
  Administered 2018-12-27 – 2018-12-28 (×2): 20 [IU] via SUBCUTANEOUS
  Administered 2018-12-28: 24 [IU] via SUBCUTANEOUS
  Administered 2018-12-28: 15 [IU] via SUBCUTANEOUS

## 2018-12-27 MED ORDER — PANTOPRAZOLE SODIUM 40 MG PO TBEC
40.0000 mg | DELAYED_RELEASE_TABLET | Freq: Two times a day (BID) | ORAL | Status: DC
  Administered 2018-12-27 – 2018-12-31 (×8): 40 mg via ORAL
  Filled 2018-12-27 (×8): qty 1

## 2018-12-27 MED ORDER — SODIUM POLYSTYRENE SULFONATE 15 GM/60ML PO SUSP
45.0000 g | Freq: Once | ORAL | Status: DC
  Administered 2018-12-27: 45 g via ORAL
  Filled 2018-12-27: qty 180

## 2018-12-27 NOTE — Progress Notes (Signed)
PROGRESS NOTE                                                                                                                                                                                                             Patient Demographics:    [name], is a 78 y.o. male, DOB - [birth year], ZOX:[id number]  Admit date - 12/26/2018   Admitting Physician Nita Sells, MD  Outpatient Primary MD for the patient is [name], MD  LOS - 1   Chief Complaint  Patient presents with  . 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   Subjective:    [name] today reports dyspnea, cough, denies chest pain    Assessment  & Plan :    Active Problems:   COPD (chronic obstructive pulmonary disease) (HCC)   CAD (coronary artery disease)   Acute on chronic respiratory failure with hypoxia and hypercapnia (HCC)   Bipolar 1 disorder, mixed, moderate (HCC)   AKI (acute kidney injury) ([place])   Diabetes mellitus with complication (HCC)   Anemia due to chronic kidney disease   Acute on chronic diastolic CHF (congestive heart failure) (HCC)   Acute on chronic respiratory failure with hypoxia (HCC)   Hypoxia   Acute on chronic respiratory failure with hypoxemia and hypercapnia -He is on 4 L nasal cannula at baseline, ABG showing hypercapnia and hypoxemia on his baseline oxygen, this is multifactorial as it does appear he is volume overloaded, as of pneumonia and COPD exacerbation. -Use incentive spirometry, flutter valve and started on Mucinex  Community-acquired pneumonia -Chest x-ray significant for pneumonia, he was started on doxycycline and Rocephin  COPD exacerbation -Is with wheezing today, will start on IV Solu-Medrol 80 mg every 8 hours, continue with PPI for prophylaxis, scheduled duo nebs and PRN albuterol - Continue albuterol 2 puffs every 6 as needed Dulera 2 puffs twice daily Spiriva 18 mcg Patient may require long-acting beta  agonist in addition  Acute on chronic CHF with a preserved EF -Patient clinically volume overload, he was on IV Lasix yesterday which I held this morning given worsening renal function, I will give him 40 mg IV Lasix today  AKI and CKD stage III -Secondary to diuresis, significantly volume overloaded, will continue with IV Lasix, hold nephrotoxic medication monitor renal function closely  Diabetes mellitus, type II, insulin-dependent -Controlled, and now I am started on IV Solu-Medrol I will anticipate it will be much higher, so I will increase his Lantus from 25 to 32 units twice daily and continue with insulin sliding scale  CAD status post stent  - No current chest pain-currently stable-ideally should be on beta-blocker ACE inhibitor-continue aspirin 81 daily  diabetic polyneuropathy -  can continue gabapentin 600 3 times daily  Prior GI bleeds with AVMs -Continue Protonix 40 daily, Carafate 1 g 3 times daily  Anemia of likely chronic disease - Continue ferrous sulfate 325 3 times daily folic acid 1 daily  Bipolar Continue Xanax 0.25 3 times daily as needed, citalopram 20 daily  Habitus for sleep apnea - Will need outpatient follow-up for the same  h/of aortic aneurysm repair - Stable at this time will need further work-up as outpatient        Code Status : full  Family Communication  : none at bedside  Disposition Plan  : pending PT consult  Barriers For Discharge : Remains with significant increased work of breathing, on IV steroids and diuresis  Consults  :   None  Procedures  : None  DVT Prophylaxis  :  [place] heparin  Lab Results  Component Value Date   PLT 181 12/27/2018    Antibiotics  :    Anti-infectives (From admission, onward)   Start     Dose/Rate Route Frequency Ordered Stop   12/27/18 1100  cefTRIAXone (ROCEPHIN) 2 g in sodium chloride 0.9 % 100 mL IVPB     2 g 200 mL/hr over 30 Minutes Intravenous Daily 12/27/18 1054     12/27/18 1100  doxycycline (VIBRAMYCIN) 100 mg in sodium chloride 0.9 % 250 mL IVPB     100 mg 125 mL/hr over 120 Minutes Intravenous 2 times daily 12/27/18 1054     12/26/18 1100  cefTRIAXone (ROCEPHIN) 1 g in sodium chloride 0.9 % 100 mL IVPB  Status:  Discontinued     1 g 200 mL/hr over 30 Minutes Intravenous Every 24 hours 12/26/18 0954 12/27/18 1054   12/26/18 0730  doxycycline (VIBRAMYCIN) 100 mg in sodium chloride 0.9 % 250 mL IVPB     100 mg 125 mL/hr over 120 Minutes Intravenous  Once 12/26/18 0725 12/26/18 1004        Objective:   Vitals:   12/27/18 0621 12/27/18 0907 12/27/18 1234 12/27/18 1430  BP:   116/69   Pulse:   92   Resp:   (!) 22   Temp:   98.7 F (37.1 C)   TempSrc:   Oral   SpO2: 92% (S) (!) 88% 92% 91%  Weight:      Height:        Wt Readings from Last 3 Encounters:  12/26/18 129.2 kg  10/14/18 119.1 kg  10/06/18 123.3 kg     Intake/Output Summary (Last 24 hours) at 12/27/2018 1439 Last data filed at 12/26/2018 2131 Gross per 24 hour  Intake 120 ml  Output 200 ml  Net -80 ml     Physical Exam  Awake Alert,  Oriented X 3, in bed, with some increased work of breathing  Symmetrical Chest wall movement, air entry at the bases with some bibasilar crackles, he has scattered wheezing RRR,No Gallops,Rubs or new Murmurs, No Parasternal Heave +ve B.Sounds, Abd Soft, No tenderness,  No rebound - guarding or rigidity. No Cyanosis, Clubbing , +1 edema, No new Rash or bruise      Data Review:    CBC Recent Labs  Lab 12/26/18 0608 12/27/18 0535  WBC 7.0 8.9  HGB  9.9* 9.6*  HCT 34.4* 33.9*  PLT 160 181  MCV 96.9 98.3  MCH 27.9 27.8  MCHC 28.8* 28.3*  RDW 17.4* 18.3*  LYMPHSABS 1.0  --   MONOABS 0.6  --   EOSABS 0.1  --   BASOSABS 0.1  --     Chemistries  Recent Labs  Lab 12/26/18 0608 12/27/18 0535  NA 134* 132*  K 4.6 5.3*  CL 96* 93*  CO2 33* 33*  GLUCOSE 333* 273*  BUN 15 20  CREATININE 1.08 1.43*  CALCIUM 8.5* 8.4*  AST  --  18  ALT  --  13  ALKPHOS  --  118  BILITOT  --  0.6   ------------------------------------------------------------------------------------------------------------------ No results for input(s): CHOL, HDL, LDLCALC, TRIG, CHOLHDL, LDLDIRECT in the last 72 hours.  Lab Results  Component Value Date   HGBA1C 9.8 (H) 10/04/2018   ------------------------------------------------------------------------------------------------------------------ No results for input(s): TSH, T4TOTAL, T3FREE, THYROIDAB in the last 72 hours.  Invalid input(s): FREET3 ------------------------------------------------------------------------------------------------------------------ No results for input(s): VITAMINB12, FOLATE, FERRITIN, TIBC, IRON, RETICCTPCT in the last 72 hours.  Coagulation profile No results for input(s): INR, PROTIME in the last 168 hours.  No results for input(s): DDIMER in the last 72 hours.  Cardiac Enzymes Recent Labs  Lab 12/26/18 0608  TROPONINI <0.03   ------------------------------------------------------------------------------------------------------------------    Component Value Date/Time   BNP 236.6 (H) 12/26/2018 0608    Inpatient Medications  Scheduled Meds: . aspirin EC  81 mg Oral Daily  . citalopram  20 mg Oral Daily  . diltiazem  240 mg Oral Daily  . ferrous sulfate  325 mg Oral TID WC  . folic acid  1 mg Oral Daily  . furosemide  40 mg Intravenous Once  . gabapentin  600 mg Oral TID  . guaiFENesin  1,200 mg Oral BID  . heparin  5,000 Units Subcutaneous Q8H  . insulin  aspart  0-9 Units Subcutaneous TID WC  . insulin aspart  6 Units Subcutaneous TID WC  . insulin glargine  25 Units Subcutaneous BID  . ipratropium-albuterol  3 mL Nebulization Q6H  . methylPREDNISolone (SOLU-MEDROL) injection  80 mg Intravenous Q8H  . mometasone-formoterol  2 puff Inhalation BID  . pantoprazole  40 mg Oral Daily  . sodium zirconium cyclosilicate  10 g Oral BID  . sucralfate  1 g Oral TID WC & HS   Continuous Infusions: . cefTRIAXone (ROCEPHIN)  IV 2 g (12/27/18 1206)  . doxycycline (VIBRAMYCIN) IV 100 mg (12/27/18 1254)   PRN Meds:.albuterol, ALPRAZolam  Micro Results Recent Results (from the past 240 hour(s))  Blood culture (routine x 2)     Status: None (Preliminary result)   Collection Time: 12/26/18  7:50 AM  Result Value Ref Range Status   Specimen Description   Final    BLOOD RIGHT ANTECUBITAL Performed at [facility] [address], [place], [place] [address]    Special Requests   Final    BOTTLES DRAWN AEROBIC AND  ANAEROBIC Blood Culture adequate volume Performed at [facility] [address], [place], [place] [address]    Culture  Setup Time   Final    GRAM POSITIVE COCCI CRITICAL VALUE NOTED.  VALUE IS CONSISTENT WITH PREVIOUSLY REPORTED AND CALLED VALUE.    Culture   Final    NO GROWTH < 24 HOURS Performed at [facility], [place] [address], [place], [place] [address]    Report Status PENDING  Incomplete  Blood culture (routine x 2)     Status: None (Preliminary result)   Collection Time: 12/26/18  8:05 AM  Result Value Ref Range Status   Specimen Description   Final    BLOOD LEFT ANTECUBITAL Performed at [facility], [place] [address], [place], [place] [address]    Special Requests   Final    BOTTLES DRAWN AEROBIC AND ANAEROBIC Blood Culture adequate volume Performed at [facility] [address] [place], [place] [address]    Culture  Setup Time   Final    GRAM  POSITIVE COCCI IN CLUSTERS AEROBIC BOTTLE ONLY Organism ID to follow CRITICAL RESULT CALLED TO, READ BACK BY AND VERIFIED WITH: Marcy Siren PharmD 9:50 12/27/18 ([email or web address]) Performed at [facility], [place] [address] [place], [place] [address]    Culture GRAM POSITIVE COCCI  Final   Report Status PENDING  Incomplete  Blood Culture ID Panel (Reflexed)     Status: Abnormal   Collection Time: 12/26/18  8:05 AM  Result Value Ref Range Status   Enterococcus species NOT DETECTED NOT DETECTED Final   Listeria monocytogenes NOT DETECTED NOT DETECTED Final   Staphylococcus species DETECTED (A) NOT DETECTED Final    Comment: Methicillin (oxacillin) resistant coagulase negative staphylococcus. Possible blood culture contaminant (unless isolated from more than one blood culture draw or clinical case suggests pathogenicity). No antibiotic treatment is indicated for blood  culture contaminants. CRITICAL RESULT CALLED TO, READ BACK BY AND VERIFIED WITH: Marcy Siren PharmD 9:50 12/27/18 ([email or web address])    Staphylococcus aureus (BCID) NOT DETECTED NOT DETECTED Final   Methicillin resistance DETECTED (A) NOT DETECTED Final    Comment: CRITICAL RESULT CALLED TO, READ BACK BY AND VERIFIED WITH: Marcy Siren PharmD 9:50 12/27/18 ([email or web address])    Streptococcus species NOT DETECTED NOT DETECTED Final   Streptococcus agalactiae NOT DETECTED NOT DETECTED Final   Streptococcus pneumoniae NOT DETECTED NOT DETECTED Final   Streptococcus pyogenes NOT DETECTED NOT DETECTED Final   Acinetobacter baumannii NOT DETECTED NOT DETECTED Final   Enterobacteriaceae species NOT DETECTED NOT DETECTED Final   Enterobacter cloacae complex NOT DETECTED NOT DETECTED Final   Escherichia coli NOT DETECTED NOT DETECTED Final   Klebsiella oxytoca NOT DETECTED NOT DETECTED Final   Klebsiella pneumoniae NOT DETECTED NOT DETECTED Final   Proteus species NOT DETECTED NOT DETECTED Final   Serratia marcescens NOT DETECTED NOT DETECTED Final    Haemophilus influenzae NOT DETECTED NOT DETECTED Final   Neisseria meningitidis NOT DETECTED NOT DETECTED Final   Pseudomonas aeruginosa NOT DETECTED NOT DETECTED Final   Candida albicans NOT DETECTED NOT DETECTED Final   Candida glabrata NOT DETECTED NOT DETECTED Final   Candida krusei NOT DETECTED NOT DETECTED Final   Candida parapsilosis NOT DETECTED NOT DETECTED Final   Candida tropicalis NOT DETECTED NOT DETECTED Final    Comment: Performed at [facility], [place]. [address] [place], [place] [address]    Radiology Reports Dg Chest 2 View  Result Date:  12/26/2018 CLINICAL DATA:  Increasing shortness of breath over several days EXAM: CHEST - 2 VIEW COMPARISON:  12/26/2018 FINDINGS: Cardiac shadow is prominent but stable. Postsurgical changes are again seen. Right basilar atelectasis is noted. Persistent left basilar infiltrate is seen. No acute bony abnormality is noted. IMPRESSION: Persistent left basilar infiltrate. Electronically Signed   By: Inez Catalina M.D.   On: 12/26/2018 09:30   Dg Chest Portable 1 View  Result Date: 12/26/2018 CLINICAL DATA:  Shortness of breath and hyperglycemia EXAM: PORTABLE CHEST 1 VIEW COMPARISON:  Chest radiograph October 11, 2018 and chest CT October 13, 2018 FINDINGS: Bullous disease in the upper lobes is again noted. There is left lower lobe airspace consolidation with small left pleural effusion. The right lung is clear. Heart is mildly enlarged. The pulmonary vascularity is stable with diminished vascularity to the upper lobes due to the bullous disease. Patient is status post coronary artery bypass grafting. There is aortic atherosclerosis. No adenopathy. No bone lesions. IMPRESSION: Upper lobe bullous disease. Left lower lobe consolidation consistent with pneumonia with small left pleural effusion. Stable cardiac prominence. Status post coronary artery bypass grafting. Aortic Atherosclerosis (ICD10-I70.0). Electronically Signed   By: Lowella Grip III M.D.   On: 12/26/2018 07:02     Phillips Climes M.D on 12/27/2018 at 2:39 PM  Between 7am to 7pm - Pager - [phone]  After 7pm go to [email or web address] - password [name]  [facility] -  Office  [phone]

## 2018-12-27 NOTE — Progress Notes (Signed)
PHARMACY - PHYSICIAN COMMUNICATION CRITICAL VALUE ALERT - BLOOD CULTURE IDENTIFICATION (BCID)  [name] is an 78 y.o. male who presented to [facility] on 12/26/2018 with a chief complaint of respiratory failure; treating CAP with possible overlying CHF and COPD exacerbations  Assessment:  1/4 bottles MR-CoNS (suspect contamination)  Name of physician (or Provider) Contacted: Elgergawy  Current antibiotics: Rocephin, doxycycline  Changes to prescribed antibiotics recommended:  Patient is on recommended antibiotics - No changes needed  Results for orders placed or performed during the hospital encounter of 12/26/18  Blood Culture ID Panel (Reflexed) (Collected: 12/26/2018  8:05 AM)  Result Value Ref Range   Enterococcus species NOT DETECTED NOT DETECTED   Listeria monocytogenes NOT DETECTED NOT DETECTED   Staphylococcus species DETECTED (A) NOT DETECTED   Staphylococcus aureus (BCID) NOT DETECTED NOT DETECTED   Methicillin resistance DETECTED (A) NOT DETECTED   Streptococcus species NOT DETECTED NOT DETECTED   Streptococcus agalactiae NOT DETECTED NOT DETECTED   Streptococcus pneumoniae NOT DETECTED NOT DETECTED   Streptococcus pyogenes NOT DETECTED NOT DETECTED   Acinetobacter baumannii NOT DETECTED NOT DETECTED   Enterobacteriaceae species NOT DETECTED NOT DETECTED   Enterobacter cloacae complex NOT DETECTED NOT DETECTED   Escherichia coli NOT DETECTED NOT DETECTED   Klebsiella oxytoca NOT DETECTED NOT DETECTED   Klebsiella pneumoniae NOT DETECTED NOT DETECTED   Proteus species NOT DETECTED NOT DETECTED   Serratia marcescens NOT DETECTED NOT DETECTED   Haemophilus influenzae NOT DETECTED NOT DETECTED   Neisseria meningitidis NOT DETECTED NOT DETECTED   Pseudomonas aeruginosa NOT DETECTED NOT DETECTED   Candida albicans NOT DETECTED NOT DETECTED   Candida glabrata NOT DETECTED NOT DETECTED   Candida krusei NOT DETECTED NOT DETECTED   Candida parapsilosis NOT DETECTED NOT  DETECTED   Candida tropicalis NOT DETECTED NOT DETECTED    Siddharth Babington A 12/27/2018  10:51 AM

## 2018-12-27 NOTE — Evaluation (Signed)
Physical Therapy Evaluation Patient Details Name: [name] MRN: [id number] DOB: 1941-06-10 Today's Date: 12/27/2018   History of Present Illness  78 yo male admitted with hypoxia, possible pna vs CHF. Hx of COPD-O2 dep, CHF, CAD, AAA, bipolar d/o, CABG  Clinical Impression  On eval, pt required Mod assist for bed mobility. Did not attempt OOB with +1 assist for safety reasons (pt very shaky/jerky, O2 sat fluctuationg 87-90% on 8L [place], and weakness). No family present during session. Will follow and progress activity as tolerated. At this time, recommendation is for ST rehab at [facility].     Follow Up Recommendations SNF    Equipment Recommendations  None recommended by PT    Recommendations for Other Services OT consult     Precautions / Restrictions Precautions Precautions: Fall Precaution Comments: monitor O2-O2 dep Restrictions Weight Bearing Restrictions: No      Mobility  Bed Mobility Overal bed mobility: Needs Assistance Bed Mobility: Supine to Sit;Sit to Supine     Supine to sit: Min assist;HOB elevated Sit to supine: Mod assist;HOB elevated   General bed mobility comments: Assist for trunk, scoot to EOB, and for bil LEs. Increased time. Task was effortful for pt.   Transfers                 General transfer comment: NT-too weak, dyspneic and will need +2 assist  Ambulation/Gait                Stairs            Wheelchair Mobility    Modified Rankin (Stroke Patients Only)       Balance Overall balance assessment: Needs assistance Sitting-balance support: Bilateral upper extremity supported Sitting balance-Leahy Scale: Fair                                       Pertinent Vitals/Pain Pain Assessment: No/denies pain    Home Living Family/patient expects to be discharged to:: Unsure Living Arrangements: Spouse/significant other Available Help at Discharge: Family Type of Home: House Home Access: Stairs to  enter Entrance Stairs-Rails: Psychiatric nurse of Steps: 6 Home Layout: One level Home Equipment: Environmental consultant - 2 wheels;Cane - single point;Bedside commode;Other (comment)(O2)      Prior Function Level of Independence: Needs assistance   Gait / Transfers Assistance Needed: walks ~60 ft from bedroom to kitchen with no assistive device      Comments: pt reports he leads a sedentary lifestyle     Hand Dominance   Dominant Hand: Right    Extremity/Trunk Assessment   Upper Extremity Assessment Upper Extremity Assessment: Generalized weakness    Lower Extremity Assessment Lower Extremity Assessment: Generalized weakness    Cervical / Trunk Assessment Cervical / Trunk Assessment: Normal  Communication   Communication: HOH  Cognition Arousal/Alertness: Awake/alert Behavior During Therapy: WFL for tasks assessed/performed Overall Cognitive Status: Within Functional Limits for tasks assessed                                        General Comments      Exercises     Assessment/Plan    PT Assessment Patient needs continued PT services  PT Problem List Decreased strength;Decreased balance;Decreased mobility;Decreased activity tolerance;Decreased knowledge of use of DME;Cardiopulmonary status limiting activity  PT Treatment Interventions DME instruction;Gait training;Therapeutic activities;Functional mobility training;Balance training;Patient/family education;Therapeutic exercise    PT Goals (Current goals can be found in the Care Plan section)  Acute Rehab PT Goals Patient Stated Goal: to get better PT Goal Formulation: With patient Time For Goal Achievement: 01/10/19 Potential to Achieve Goals: Fair    Frequency Min 2X/week   Barriers to discharge        Co-evaluation               AM-PAC PT "6 Clicks" Mobility  Outcome Measure Help needed turning from your back to your side while in a flat bed without using bedrails?:  A Lot Help needed moving from lying on your back to sitting on the side of a flat bed without using bedrails?: A Lot Help needed moving to and from a bed to a chair (including a wheelchair)?: Total Help needed standing up from a chair using your arms (e.g., wheelchair or bedside chair)?: Total Help needed to walk in hospital room?: Total Help needed climbing 3-5 steps with a railing? : Total 6 Click Score: 8    End of Session Equipment Utilized During Treatment: Oxygen Activity Tolerance: Patient limited by fatigue Patient left: in bed;with call bell/phone within reach;with bed alarm set   PT Visit Diagnosis: Muscle weakness (generalized) (M62.81);Difficulty in walking, not elsewhere classified (R26.2)    Time: 2761-4709 PT Time Calculation (min) (ACUTE ONLY): 17 min   Charges:   PT Evaluation $PT Eval Moderate Complexity: [place], PT Acute Rehabilitation Services Pager: [phone] Office: [phone]

## 2018-12-27 NOTE — Progress Notes (Signed)
CRITICAL VALUE ALERT  Critical Value: CO2 77.1  Date & Time Notied: 12/27/18 ; 0033  Provider Notified: Yes  Orders Received/Actions taken: Awaiting any new orders.

## 2018-12-28 DIAGNOSIS — J439 Emphysema, unspecified: Secondary | ICD-10-CM

## 2018-12-28 LAB — CBC
HCT: 33.4 % — ABNORMAL LOW (ref 39.0–52.0)
Hemoglobin: 9.7 g/dL — ABNORMAL LOW (ref 13.0–17.0)
MCH: 27.6 pg (ref 26.0–34.0)
MCHC: 29 g/dL — ABNORMAL LOW (ref 30.0–36.0)
MCV: 94.9 fL (ref 80.0–100.0)
PLATELETS: 120 10*3/uL — AB (ref 150–400)
RBC: 3.52 MIL/uL — ABNORMAL LOW (ref 4.22–5.81)
RDW: 17.4 % — AB (ref 11.5–15.5)
WBC: 6.4 10*3/uL (ref 4.0–10.5)
nRBC: 0.3 % — ABNORMAL HIGH (ref 0.0–0.2)

## 2018-12-28 LAB — BASIC METABOLIC PANEL
Anion gap: 12 (ref 5–15)
BUN: 26 mg/dL — ABNORMAL HIGH (ref 8–23)
CO2: 32 mmol/L (ref 22–32)
CREATININE: 1.42 mg/dL — AB (ref 0.61–1.24)
Calcium: 8.1 mg/dL — ABNORMAL LOW (ref 8.9–10.3)
Chloride: 90 mmol/L — ABNORMAL LOW (ref 98–111)
GFR calc Af Amer: 55 mL/min — ABNORMAL LOW (ref >60)
GFR calc non Af Amer: 47 mL/min — ABNORMAL LOW (ref >60)
Glucose, Bld: 391 mg/dL — ABNORMAL HIGH (ref 70–99)
Potassium: 4.5 mmol/L (ref 3.5–5.1)
Sodium: 134 mmol/L — ABNORMAL LOW (ref 135–145)

## 2018-12-28 LAB — PROCALCITONIN: Procalcitonin: 0.24 ng/mL

## 2018-12-28 LAB — GLUCOSE, CAPILLARY
GLUCOSE-CAPILLARY: 437 mg/dL — AB (ref 70–99)
Glucose-Capillary: 317 mg/dL — ABNORMAL HIGH (ref 70–99)
Glucose-Capillary: 336 mg/dL — ABNORMAL HIGH (ref 70–99)
Glucose-Capillary: 368 mg/dL — ABNORMAL HIGH (ref 70–99)
Glucose-Capillary: 369 mg/dL — ABNORMAL HIGH (ref 70–99)
Glucose-Capillary: 379 mg/dL — ABNORMAL HIGH (ref 70–99)
Glucose-Capillary: 380 mg/dL — ABNORMAL HIGH (ref 70–99)
Glucose-Capillary: 402 mg/dL — ABNORMAL HIGH (ref 70–99)
Glucose-Capillary: 431 mg/dL — ABNORMAL HIGH (ref 70–99)
Glucose-Capillary: 435 mg/dL — ABNORMAL HIGH (ref 70–99)

## 2018-12-28 MED ORDER — FUROSEMIDE 10 MG/ML IJ SOLN
60.0000 mg | Freq: Once | INTRAMUSCULAR | Status: AC
  Administered 2018-12-28: 60 mg via INTRAVENOUS
  Filled 2018-12-28: qty 6

## 2018-12-28 MED ORDER — LORAZEPAM 2 MG/ML IJ SOLN
1.0000 mg | Freq: Three times a day (TID) | INTRAMUSCULAR | Status: DC | PRN
  Administered 2018-12-28: 1 mg via INTRAVENOUS
  Filled 2018-12-28: qty 1

## 2018-12-28 MED ORDER — INSULIN GLARGINE 100 UNIT/ML ~~LOC~~ SOLN
45.0000 [IU] | Freq: Two times a day (BID) | SUBCUTANEOUS | Status: DC
  Administered 2018-12-28: 45 [IU] via SUBCUTANEOUS
  Filled 2018-12-28 (×2): qty 0.45

## 2018-12-28 MED ORDER — VANCOMYCIN HCL 10 G IV SOLR
2000.0000 mg | Freq: Once | INTRAVENOUS | Status: DC
  Filled 2018-12-28: qty 2000

## 2018-12-28 MED ORDER — METHYLPREDNISOLONE SODIUM SUCC 125 MG IJ SOLR
60.0000 mg | Freq: Three times a day (TID) | INTRAMUSCULAR | Status: DC
  Administered 2018-12-28: 60 mg via INTRAVENOUS
  Filled 2018-12-28: qty 2

## 2018-12-28 MED ORDER — IPRATROPIUM-ALBUTEROL 0.5-2.5 (3) MG/3ML IN SOLN
3.0000 mL | Freq: Three times a day (TID) | RESPIRATORY_TRACT | Status: DC
  Administered 2018-12-28 – 2018-12-31 (×10): 3 mL via RESPIRATORY_TRACT
  Filled 2018-12-28 (×10): qty 3

## 2018-12-28 MED ORDER — INSULIN ASPART 100 UNIT/ML ~~LOC~~ SOLN
0.0000 [IU] | Freq: Three times a day (TID) | SUBCUTANEOUS | Status: DC
  Administered 2018-12-28: 20 [IU] via SUBCUTANEOUS

## 2018-12-28 MED ORDER — INSULIN ASPART 100 UNIT/ML ~~LOC~~ SOLN
10.0000 [IU] | Freq: Three times a day (TID) | SUBCUTANEOUS | Status: DC
  Administered 2018-12-28 – 2018-12-31 (×9): 10 [IU] via SUBCUTANEOUS

## 2018-12-28 MED ORDER — METHYLPREDNISOLONE SODIUM SUCC 40 MG IJ SOLR
40.0000 mg | Freq: Two times a day (BID) | INTRAMUSCULAR | Status: DC
  Administered 2018-12-29 (×2): 40 mg via INTRAVENOUS
  Filled 2018-12-28 (×2): qty 1

## 2018-12-28 MED ORDER — GABAPENTIN 300 MG PO CAPS
600.0000 mg | ORAL_CAPSULE | ORAL | Status: DC
  Administered 2018-12-28 – 2018-12-31 (×8): 600 mg via ORAL
  Filled 2018-12-28 (×8): qty 2

## 2018-12-28 MED ORDER — LORAZEPAM 2 MG/ML IJ SOLN: 1.0000 mg | Freq: Three times a day (TID) | INTRAMUSCULAR | Status: DC

## 2018-12-28 MED ORDER — INSULIN GLARGINE 100 UNIT/ML ~~LOC~~ SOLN
55.0000 [IU] | Freq: Two times a day (BID) | SUBCUTANEOUS | Status: DC
  Administered 2018-12-28: 55 [IU] via SUBCUTANEOUS
  Filled 2018-12-28 (×2): qty 0.55

## 2018-12-28 MED ORDER — INSULIN ASPART 100 UNIT/ML ~~LOC~~ SOLN
10.0000 [IU] | Freq: Once | SUBCUTANEOUS | Status: AC
  Administered 2018-12-28: 10 [IU] via SUBCUTANEOUS

## 2018-12-28 MED ORDER — INSULIN ASPART 100 UNIT/ML ~~LOC~~ SOLN
0.0000 [IU] | SUBCUTANEOUS | Status: DC
  Administered 2018-12-29 (×2): 20 [IU] via SUBCUTANEOUS
  Administered 2018-12-29: 15 [IU] via SUBCUTANEOUS
  Administered 2018-12-29: 11 [IU] via SUBCUTANEOUS
  Administered 2018-12-29 – 2018-12-30 (×2): 15 [IU] via SUBCUTANEOUS
  Administered 2018-12-30 (×2): 7 [IU] via SUBCUTANEOUS
  Administered 2018-12-30: 15 [IU] via SUBCUTANEOUS

## 2018-12-28 MED ORDER — METHYLPREDNISOLONE SODIUM SUCC 40 MG IJ SOLR: 40.0000 mg | Freq: Two times a day (BID) | INTRAMUSCULAR | Status: DC

## 2018-12-28 NOTE — Care Management Important Message (Signed)
Important Message  Patient Details  Name: [name] MRN: [id number] Date of Birth: Aug 08, 1941   Medicare Important Message Given:  Yes    Kerin Salen 12/28/2018, 12:37 [place] Message  Patient Details  Name: [name] MRN: [id number] Date of Birth: 1941/02/08   Medicare Important Message Given:  Yes    Kerin Salen 12/28/2018, 12:37 PM

## 2018-12-28 NOTE — Progress Notes (Signed)
Pt's CBG 437. On call provider made aware. New orders placed. Per MD, RN to give 20 units of insulin. This RN will carry out order and continue to monitor closely.

## 2018-12-28 NOTE — TOC Initial Note (Signed)
Transition of Care Howard Howard) - Initial/Assessment Note    Patient Details  Name: Howard Howard MRN: [id number] Date of Birth: 1941-07-05  Transition of Care [facility]) CM/SW Contact:    Wende Neighbors, LCSW Phone Number: 12/28/2018, 11:34 AM  Clinical Narrative: CSW met patient at bedside to discuss discharge plans. CSW stated to patient that PT recommended SNF placement. Patient become extremely inappropriate and stating that "his not going to no fucking nursing facility with everything going on". Patient asked if any other patient are going to rehab, CSW stated that yes other patient have left the [facility] to go to rehab. Patient stated well there fucking idiots. CSW asked if patient would be agreeable for CSW to set up home health in the home. Patient then raised his voice at [facility] stating that " I don't want home health in my home bc they cant afford to pay white people to work for them but they always have black coming in to care for me". CSW asked again if patient wanted home health and again patient stated " I want white people caring for me and how am I going to get a home health agency to do that". Patient stated I don't want no people in my house. During conversation with patient, patient extremely racist and inappropriate in how he communicated with CSW but also how he would speak about RN.  CSW stepped out of room at the request of patient in wanting some coffee. CSW brought patient coffee. At this time patient is refusing home health and snf facility placement     Expected Discharge Plan: Home/Self Care(pt refused snf and home health) Barriers to Discharge: Continued Medical Work up   Patient Goals and CMS Choice   CMS Medicare.gov Compare Post Acute Care list provided to:: (pt refused home health and snf) Choice offered to / list presented to : NA  Expected Discharge Plan and Services Expected Discharge Plan: Home/Self Care(pt refused snf and home health)     Living arrangements for  the past 2 months: Single Family Home Expected Discharge Date: (unknown)                        Prior Living Arrangements/Services Living arrangements for the past 2 months: Single Family Home Lives with:: Spouse Patient language and need for interpreter reviewed:: No Do you feel safe going back to the place where you live?: Yes      Need for Family Participation in Patient Care: Yes (Comment) Care giver support system in place?: Yes (comment)   Criminal Activity/Legal Involvement Pertinent to Current Situation/Hospitalization: No - Comment as needed  Activities of Daily Living Home Assistive Devices/Equipment: Eyeglasses, Environmental consultant (specify type), Oxygen(front wheeled walker) ADL Screening (condition at time of admission) Patient's cognitive ability adequate to safely complete daily activities?: Yes Is the patient deaf or have difficulty hearing?: No Does the patient have difficulty seeing, even when wearing glasses/contacts?: No Does the patient have difficulty concentrating, remembering, or making decisions?: Yes Patient able to express need for assistance with ADLs?: Yes Does the patient have difficulty dressing or bathing?: Yes Independently performs ADLs?: No Communication: Independent Dressing (OT): Needs assistance Is this a change from baseline?: Pre-admission baseline Grooming: Needs assistance Is this a change from baseline?: Pre-admission baseline Feeding: Needs assistance Is this a change from baseline?: Pre-admission baseline Bathing: Needs assistance Is this a change from baseline?: Pre-admission baseline Toileting: Dependent Is this a change from baseline?: Change from baseline, expected to last >  3days In/Out Bed: Dependent Is this a change from baseline?: Change from baseline, expected to last >3 days Walks in Home: Dependent Is this a change from baseline?: Change from baseline, expected to last >3 days Does the patient have difficulty walking or climbing  stairs?: Yes Weakness of Legs: Both Weakness of Arms/Hands: Both  Permission Sought/Granted Permission sought to share information with : Family Supports Permission granted to share information with : Yes, Verbal Permission Granted  Share Information with NAME: Howard Howard     Permission granted to share info w Relationship: spouse  Permission granted to share info w Contact Information: [phone]  Emotional Assessment Appearance:: Appears stated age Attitude/Demeanor/Rapport: Aggressive (Verbally and/or physically), Irrational, Angry, Hostile, Self-Absorbed, Complaining Affect (typically observed): Defensive, Agitated, Explosive, Irritable, Inappropriate Orientation: : Oriented to Situation, Oriented to  Time, Oriented to Place Alcohol / Substance Use: Not Applicable Psych Involvement: No (comment)  Admission diagnosis:  Peripheral edema [R60.9] Hyperglycemia [R73.9] Hypoxia [R09.02] PNA (pneumonia) [J18.9] Community acquired pneumonia of left lower lobe of lung ([place]) [J18.1] Patient Active Problem List   Diagnosis Date Noted  . Hypoxia 12/26/2018  . Acute on chronic respiratory failure with hypoxia ([place])   . Acute on chronic diastolic CHF (congestive heart failure) ([place]) 10/02/2018  . Acute respiratory failure ([place]) 10/02/2018  . Acute [place] ulcer   . Acute GI bleeding 02/08/2018  . Anemia due to chronic kidney disease   . AKI (acute kidney injury) ([place]) 05/08/2017  . Diabetes mellitus with complication ([place]) 30/04/6225  . Melena   . Benign neoplasm of sigmoid colon   . Benign neoplasm of descending colon   . AVM (arteriovenous malformation) of colon with hemorrhage   . GI bleed 04/05/2017  . Intermittent left lower quadrant abdominal pain   . Generalized weakness 11/09/2016  . Symptomatic anemia 10/21/2016  . Low back pain 07/02/2015  . Gastric AVM   . AVM (arteriovenous malformation) of duodenum, acquired with hemorrhage   . Bipolar 1 disorder, mixed,  moderate ([place]) 04/16/2015  . Major depressive disorder, recurrent, severe without psychotic features ([place])   . Supplemental oxygen dependent 11/30/2014  . Iron deficiency anemia due to chronic blood loss 11/25/2014  . Vitamin D deficiency 08/10/2014  . Insomnia 08/10/2014  . Polypharmacy 08/10/2014  . Chronic pain syndrome 08/10/2014  . 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 Abdominal pain 12/04/2013  . Aftercare following surgery of the circulatory system, [place] 11/04/2013  . Acute blood loss anemia 10/16/2013  . Multiple gastric polyps 09/10/2013  . Anxiety state 09/10/2013  . 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 Chronic hypoxemic respiratory failure ([place]) 05/21/2013  . Acute on chronic respiratory failure with hypoxia and hypercapnia ([place]) 12/04/2012  . Chronic GI bleeding 05/17/2012  . CAD (coronary artery disease) 04/17/2012  . Chronic diastolic CHF (congestive heart failure) ([place]) 03/27/2012  . COPD (chronic obstructive pulmonary disease) ([place]) 09/13/2011  . CERUMEN IMPACTION, BILATERAL 11/11/2010  . Hyperlipidemia 08/18/2009  . Essential hypertension 08/18/2009  . ALLERGIC RHINITIS 08/18/2009  . AAA (abdominal aortic aneurysm) ([place]) 12/15/2008   PCP:  [name], MD Pharmacy:   [id number] [place], [id number]: [phone]: [phone]    Social Determinants of Health (SDOH) Interventions    Readmission Risk Interventions 30 Day Unplanned Readmission Risk Score     ED to Hosp-Admission (Current) from 12/26/2018 in [facility] 30  Day Unplanned Readmission Risk Score (%)  34 Filed at 12/28/2018 0801     This score is the patient's risk of an unplanned readmission within 30 days of being discharged (0 -100%). The score is based on dignosis, age, lab data, medications,  orders, and past utilization.   Low:  0-14.9   Medium: 15-21.9   High: 22-29.9   Extreme: 30 and above       No flowsheet data found.

## 2018-12-28 NOTE — Progress Notes (Signed)
Follow-up CBG 402. On call provider made aware and placed order for an additional 10 units of novolog. This RN will carry out order and continue to monitor closely.

## 2018-12-28 NOTE — Progress Notes (Signed)
PROGRESS NOTE                                                                                                                                                                                                             Patient Demographics:    [name], is a 78 y.o. male, DOB - May 11, 1941, MVE:[id number]  Admit date - 12/26/2018   Admitting Physician Nita Sells, MD  Outpatient Primary MD for the patient is Howard Ebbs, MD  LOS - 2   Chief Complaint  Patient presents with  . Hyperglycemia       Brief Narrative    78 year old male with History of AAA-last check 4.1 cm-status post [name] + right hypogastric artery coiling Diastolic heart failure, CAD status post stent CABG x4, COPD on home oxygen 4 L-has not seen pulmonology in a long time, DM TY 2, Prior acute blood loss anemia secondary to AVMs who did push, enteroscopy 01/2018, Bipolar ,HTN, presents with complaints of shortness of breath, admitted for further work-up.   Subjective:    [name] today reports his cough has improved, less productive, denies any chest pain,   Assessment  & Plan :    Active Problems:   COPD (chronic obstructive pulmonary disease) (HCC)   CAD (coronary artery disease)   Acute on chronic respiratory failure with hypoxia and hypercapnia (HCC)   Bipolar 1 disorder, mixed, moderate (HCC)   AKI (acute kidney injury) ([place])   Diabetes mellitus with complication (HCC)   Anemia due to chronic kidney disease   Acute on chronic diastolic CHF (congestive heart failure) (HCC)   Acute on chronic respiratory failure with hypoxia (HCC)   Hypoxia   Acute on chronic respiratory failure with hypoxemia and hypercapnia -He is on 4 L nasal cannula at baseline, ABG showing hypercapnia and hypoxemia on his baseline oxygen, this is multifactorial as it does appear he is volume overloaded, as of pneumonia and COPD exacerbation. -Use incentive spirometry, flutter  valve and started on Mucinex  Community-acquired pneumonia -Chest x-ray significant for pneumonia, he was started on doxycycline and Rocephin  Positive blood cultures -Patient has 2 bottles of blood culture positive for Staphylococcus coag negative, this is most likely contaminant, he is afebrile, no leukocytosis, nontoxic-appearing, will repeat blood cultures and follow final results.  COPD exacerbation -Patient on IV Solu-Medrol, wheezing significantly improved  today, given his significant hyperglycemia will decrease Solu-Medrol to 40 mg IV every 12 hours  - continue with PPI for prophylaxis, scheduled duo nebs and PRN albuterol - Continue albuterol 2 puffs every 6 as needed Dulera 2 puffs twice daily Spiriva 18 mcg Patient may require long-acting beta agonist in addition  Acute on chronic CHF with a preserved EF -Patient clinically volume overload, function remained stable, will give another dose of IV Lasix today.  AKI and CKD stage III -Secondary to diuresis, significantly volume overloaded, will continue with IV Lasix, hold nephrotoxic medication monitor renal function closely  Diabetes mellitus, type II, insulin-dependent -Severely uncontrolled most likely due to steroids, I have increased his Lantus to 45 units this morning still elevated, so I have increased from 25 units twice daily to 55 units twice daily, as well on resistant insulin sliding scale, and 10 units NovoLog before meals .  CAD status post stent  - No current chest pain-currently stable-ideally should be on beta-blocker ACE inhibitor-continue aspirin 81 daily  diabetic polyneuropathy -  can continue gabapentin 600 3 times daily  Prior GI bleeds with AVMs -Continue Protonix 40 daily, Carafate 1 g 3 times daily  Anemia of likely chronic disease - Continue ferrous sulfate 325 3 times daily folic acid 1 daily  Bipolar Continue Xanax 0.25 3 times daily as needed, citalopram 20 daily  Habitus for sleep apnea -  Will need outpatient follow-up for the same  h/of aortic aneurysm repair - Stable at this time will need further work-up as outpatient        Code Status : full  Family Communication  : none at bedside  Disposition Plan  : pending PT consult  Barriers For Discharge : Remains  on IV steroids and diuresis  Consults  :  None  Procedures  : None  DVT Prophylaxis  :  [place] heparin  Lab Results  Component Value Date   PLT 120 (L) 12/28/2018    Antibiotics  :    Anti-infectives (From admission, onward)   Start     Dose/Rate Route Frequency Ordered Stop   12/28/18 1100  vancomycin (VANCOCIN) 2,000 mg in sodium chloride 0.9 % 500 mL IVPB  Status:  Discontinued     2,000 mg 250 mL/hr over 120 Minutes Intravenous  Once 12/28/18 1044 12/28/18 1059   12/27/18 1100  cefTRIAXone (ROCEPHIN) 2 g in sodium chloride 0.9 % 100 mL IVPB     2 g 200 mL/hr over 30 Minutes Intravenous Daily 12/27/18 1054     12/27/18 1100  doxycycline (VIBRAMYCIN) 100 mg in sodium chloride 0.9 % 250 mL IVPB     100 mg 125 mL/hr over 120 Minutes Intravenous 2 times daily 12/27/18 1054     12/26/18 1100  cefTRIAXone (ROCEPHIN) 1 g in sodium chloride 0.9 % 100 mL IVPB  Status:  Discontinued     1 g 200 mL/hr over 30 Minutes Intravenous Every 24 hours 12/26/18 0954 12/27/18 1054   12/26/18 0730  doxycycline (VIBRAMYCIN) 100 mg in sodium chloride 0.9 % 250 mL IVPB     100 mg 125 mL/hr over 120 Minutes Intravenous  Once 12/26/18 0725 12/26/18 1004        Objective:   Vitals:   12/27/18 2219 12/28/18 0418 12/28/18 0826 12/28/18 1300  BP:  135/77  111/60  Pulse:  89  98  Resp:  20    Temp:  98.2 F (36.8 C)  98.6 F (37 C)  TempSrc:  Oral  Oral  SpO2: 91% 96% 95% 94%  Weight:      Height:        Wt Readings from Last 3 Encounters:  12/26/18 129.2 kg  10/14/18 119.1 kg  10/06/18 123.3 kg     Intake/Output Summary (Last 24 hours) at 12/28/2018 1510 Last data filed at 12/28/2018 1143 Gross per 24  hour  Intake 611.81 ml  Output 2026 ml  Net -1414.19 ml     Physical Exam  Awake Alert, Oriented X 3, No new F.N deficits Symmetrical Chest wall movement, diminshed at the bases, has some scattered wheezing RRR,No Gallops,Rubs or new Murmurs, No Parasternal Heave +ve B.Sounds, Abd Soft, No tenderness, No rebound - guarding or rigidity. No Cyanosis, Clubbing , +1 edema, No new Rash or bruise      Data Review:    CBC Recent Labs  Lab 12/26/18 0608 12/27/18 0535 12/28/18 0534  WBC 7.0 8.9 6.4  HGB 9.9* 9.6* 9.7*  HCT 34.4* 33.9* 33.4*  PLT 160 181 120*  MCV 96.9 98.3 94.9  MCH 27.9 27.8 27.6  MCHC 28.8* 28.3* 29.0*  RDW 17.4* 18.3* 17.4*  LYMPHSABS 1.0  --   --   MONOABS 0.6  --   --   EOSABS 0.1  --   --   BASOSABS 0.1  --   --     Chemistries  Recent Labs  Lab 12/26/18 0608 12/27/18 0535 12/28/18 0534  NA 134* 132* 134*  K 4.6 5.3* 4.5  CL 96* 93* 90*  CO2 33* 33* 32  GLUCOSE 333* 273* 391*  BUN 15 20 26*  CREATININE 1.08 1.43* 1.42*  CALCIUM 8.5* 8.4* 8.1*  AST  --  18  --   ALT  --  13  --   ALKPHOS  --  118  --   BILITOT  --  0.6  --    ------------------------------------------------------------------------------------------------------------------ No results for input(s): CHOL, HDL, LDLCALC, TRIG, CHOLHDL, LDLDIRECT in the last 72 hours.  Lab Results  Component Value Date   HGBA1C 9.8 (H) 10/04/2018   ------------------------------------------------------------------------------------------------------------------ No results for input(s): TSH, T4TOTAL, T3FREE, THYROIDAB in the last 72 hours.  Invalid input(s): FREET3 ------------------------------------------------------------------------------------------------------------------ No results for input(s): VITAMINB12, FOLATE, FERRITIN, TIBC, IRON, RETICCTPCT in the last 72 hours.  Coagulation profile No results for input(s): INR, PROTIME in the last 168 hours.  No results for input(s):  DDIMER in the last 72 hours.  Cardiac Enzymes Recent Labs  Lab 12/26/18 0608  TROPONINI <0.03   ------------------------------------------------------------------------------------------------------------------    Component Value Date/Time   BNP 236.6 (H) 12/26/2018 0608    Inpatient Medications  Scheduled Meds: . aspirin EC  81 mg Oral Daily  . citalopram  20 mg Oral Daily  . diltiazem  240 mg Oral Daily  . ferrous sulfate  325 mg Oral TID WC  . folic acid  1 mg Oral Daily  . gabapentin  600 mg Oral 3 times per day  . guaiFENesin  1,200 mg Oral BID  . heparin  5,000 Units Subcutaneous Q8H  . insulin aspart  0-20 Units Subcutaneous TID WC  . insulin aspart  6 Units Subcutaneous TID WC  . insulin glargine  45 Units Subcutaneous BID  . ipratropium-albuterol  3 mL Nebulization TID  . methylPREDNISolone (SOLU-MEDROL) injection  60 mg Intravenous Q8H  . mometasone-formoterol  2 puff Inhalation BID  . pantoprazole  40 mg Oral BID  . sucralfate  1 g Oral TID WC & HS   Continuous Infusions: .  cefTRIAXone (ROCEPHIN)  IV 2 g (12/28/18 0912)  . doxycycline (VIBRAMYCIN) IV 100 mg (12/28/18 1037)   PRN Meds:.acetaminophen, albuterol, ALPRAZolam  Micro Results Recent Results (from the past 240 hour(s))  Blood culture (routine x 2)     Status: None (Preliminary result)   Collection Time: 12/26/18  7:50 AM  Result Value Ref Range Status   Specimen Description   Final    BLOOD RIGHT ANTECUBITAL Performed at [facility] [address] [place], [place] [address]    Special Requests   Final    BOTTLES DRAWN AEROBIC AND ANAEROBIC Blood Culture adequate volume Performed at [facility] [address], [place], [place] [address]    Culture  Setup Time   Final    GRAM POSITIVE COCCI CRITICAL VALUE NOTED.  VALUE IS CONSISTENT WITH PREVIOUSLY REPORTED AND CALLED VALUE.    Culture   Final    GRAM POSITIVE COCCI CULTURE REINCUBATED FOR BETTER  GROWTH Performed at [facility], [place] [address] [place], [place] [address]    Report Status PENDING  Incomplete  Blood culture (routine x 2)     Status: None (Preliminary result)   Collection Time: 12/26/18  8:05 AM  Result Value Ref Range Status   Specimen Description   Final    BLOOD LEFT ANTECUBITAL Performed at [facility], [place] [address] [place], [place] [address]    Special Requests   Final    BOTTLES DRAWN AEROBIC AND ANAEROBIC Blood Culture adequate volume Performed at [facility] [address] [place], [place] [address]    Culture  Setup Time   Final    GRAM POSITIVE COCCI IN CLUSTERS AEROBIC BOTTLE ONLY Organism ID to follow CRITICAL RESULT CALLED TO, READ BACK BY AND VERIFIED WITH: Marcy Siren PharmD 9:50 12/27/18 ([email or web address])    Culture   Final    GRAM POSITIVE COCCI CULTURE REINCUBATED FOR BETTER GROWTH Performed at [facility], [place] [address] [place], [place] [address]    Report Status PENDING  Incomplete  Blood Culture ID Panel (Reflexed)     Status: Abnormal   Collection Time: 12/26/18  8:05 AM  Result Value Ref Range Status   Enterococcus species NOT DETECTED NOT DETECTED Final   Listeria monocytogenes NOT DETECTED NOT DETECTED Final   Staphylococcus species DETECTED (A) NOT DETECTED Final    Comment: Methicillin (oxacillin) resistant coagulase negative staphylococcus. Possible blood culture contaminant (unless isolated from more than one blood culture draw or clinical case suggests pathogenicity). No antibiotic treatment is indicated for blood  culture contaminants. CRITICAL RESULT CALLED TO, READ BACK BY AND VERIFIED WITH: Marcy Siren PharmD 9:50 12/27/18 ([email or web address])    Staphylococcus aureus (BCID) NOT DETECTED NOT DETECTED Final   Methicillin resistance DETECTED (A) NOT DETECTED Final    Comment: CRITICAL RESULT CALLED TO, READ BACK BY AND VERIFIED WITH: Marcy Siren PharmD 9:50 12/27/18 ([email or web address])    Streptococcus  species NOT DETECTED NOT DETECTED Final   Streptococcus agalactiae NOT DETECTED NOT DETECTED Final   Streptococcus pneumoniae NOT DETECTED NOT DETECTED Final   Streptococcus pyogenes NOT DETECTED NOT DETECTED Final   Acinetobacter baumannii NOT DETECTED NOT DETECTED Final   Enterobacteriaceae species NOT DETECTED NOT DETECTED Final   Enterobacter cloacae complex NOT DETECTED NOT DETECTED Final   Escherichia coli NOT DETECTED NOT DETECTED Final   Klebsiella oxytoca NOT DETECTED NOT DETECTED Final   Klebsiella pneumoniae NOT DETECTED NOT DETECTED Final   Proteus species  NOT DETECTED NOT DETECTED Final   Serratia marcescens NOT DETECTED NOT DETECTED Final   Haemophilus influenzae NOT DETECTED NOT DETECTED Final   Neisseria meningitidis NOT DETECTED NOT DETECTED Final   Pseudomonas aeruginosa NOT DETECTED NOT DETECTED Final   Candida albicans NOT DETECTED NOT DETECTED Final   Candida glabrata NOT DETECTED NOT DETECTED Final   Candida krusei NOT DETECTED NOT DETECTED Final   Candida parapsilosis NOT DETECTED NOT DETECTED Final   Candida tropicalis NOT DETECTED NOT DETECTED Final    Comment: Performed at [facility], [place] [address] [place],  [address]    Radiology Reports Dg Chest 2 View  Result Date: 12/26/2018 CLINICAL DATA:  Increasing shortness of breath over several days EXAM: CHEST - 2 VIEW COMPARISON:  12/26/2018 FINDINGS: Cardiac shadow is prominent but stable. Postsurgical changes are again seen. Right basilar atelectasis is noted. Persistent left basilar infiltrate is seen. No acute bony abnormality is noted. IMPRESSION: Persistent left basilar infiltrate. Electronically Signed   By: Inez Catalina M.D.   On: 12/26/2018 09:30   Dg Chest Portable 1 View  Result Date: 12/26/2018 CLINICAL DATA:  Shortness of breath and hyperglycemia EXAM: PORTABLE CHEST 1 VIEW COMPARISON:  Chest radiograph October 11, 2018 and chest CT October 13, 2018 FINDINGS: Bullous disease in the  upper lobes is again noted. There is left lower lobe airspace consolidation with small left pleural effusion. The right lung is clear. Heart is mildly enlarged. The pulmonary vascularity is stable with diminished vascularity to the upper lobes due to the bullous disease. Patient is status post coronary artery bypass grafting. There is aortic atherosclerosis. No adenopathy. No bone lesions. IMPRESSION: Upper lobe bullous disease. Left lower lobe consolidation consistent with pneumonia with small left pleural effusion. Stable cardiac prominence. Status post coronary artery bypass grafting. Aortic Atherosclerosis (ICD10-I70.0). Electronically Signed   By: Lowella Grip III M.D.   On: 12/26/2018 07:02     Phillips Climes M.D on 12/28/2018 at 3:10 PM  Between 7am to 7pm - Pager - [phone]  After 7pm go to [email or web address] - password [facility]  [facility] -  Office  [phone]

## 2018-12-28 NOTE — Evaluation (Signed)
Occupational Therapy Evaluation Patient Details Name: [name] MRN: [id number] DOB: [birth year] Today's Date: 12/28/2018    History of Present Illness 78 yo male admitted with hypoxia, possible pna vs CHF. Hx of COPD-O2 dep, CHF, CAD, AAA, bipolar d/o, CABG   Clinical Impression   Pt was admitted for the above.  Pt initially agreeable to working with OT (and pushed lunch away despite my offering to come back).  Assessed strength and talked to him about building strength and endurance.  Pt became unhappy when I didn't support his statement that the MD was pushing him out because of insurance, and he said he wanted me to leave.  Offered to bring him theraband to work with on his own, and he states that he isn't sure what he wants to do.  Will continue to try to work with pt, if he is agreeable. See goals below    Follow Up Recommendations  SNF;Supervision/Assistance - 24 hour(per notes, pt is declining snf)    Equipment Recommendations  None recommended by OT    Recommendations for Other Services       Precautions / Restrictions Precautions Precautions: Fall Precaution Comments: monitor O2-O2 dep Restrictions Weight Bearing Restrictions: No      Mobility Bed Mobility               General bed mobility comments: pt declined  Transfers                 General transfer comment: pt declined    Balance                                           ADL either performed or assessed with clinical judgement   ADL Overall ADL's : Needs assistance/impaired Eating/Feeding: Set up                                     General ADL Comments: pt needs max to total A for adls due to SOB.  Encouraged pt to perform parts of activity to build endurance.  Pt states MD is throwing him out because of insurance. When OT said that MD's discharge pts when they are medically stable, he called the end of the session.  Pt states he wants to get strong  enough to get out of here, but he was not agreeable for sitting EOB or up in chair.       Vision         Perception     Praxis      Pertinent Vitals/Pain Pain Assessment: No/denies pain     Hand Dominance Right   Extremity/Trunk Assessment Upper Extremity Assessment Upper Extremity Assessment: Generalized weakness           Communication Communication Communication: HOH   Cognition Arousal/Alertness: Awake/alert Behavior During Therapy: (pleasant to agitated) Overall Cognitive Status: No family/caregiver present to determine baseline cognitive functioning                                     General Comments       Exercises Exercises: (offered level one theraband:  pt unsure if he wants this)   Shoulder Instructions      Home Living Family/patient expects to  be discharged to:: Unsure Living Arrangements: Spouse/significant other                           Home Equipment: Walker - 2 wheels;Cane - single point;Bedside commode;Other (comment)(02)          Prior Functioning/Environment    Gait / Transfers Assistance Needed: walks ~60 ft from bedroom to kitchen with no assistive device      Comments: pt reports he leads a sedentary lifestyle        OT Problem List: Decreased strength;Decreased activity tolerance;Decreased knowledge of use of DME or AE;Decreased knowledge of precautions;Cardiopulmonary status limiting activity      OT Treatment/Interventions: Self-care/ADL training;Therapeutic exercise;Energy conservation;DME and/or AE instruction;Patient/family education;Balance training;Therapeutic activities    OT Goals(Current goals can be found in the care plan section) Acute Rehab OT Goals Patient Stated Goal: to get better OT Goal Formulation: Patient unable to participate in goal setting Time For Goal Achievement: 01/11/19 Potential to Achieve Goals: Fair ADL Goals Pt Will Transfer to Toilet: with mod assist;with +2  assist;bedside commode(stand/squat pivot or lateral) Pt Will Perform Toileting - Clothing Manipulation and hygiene: with mod assist;with 2+ total assist;sit to/from stand Additional ADL Goal #2: pt will perform 5 reps of level one theraband shoulder exercises with rest breaks as needed to increase strength for adls Additional ADL Goal #3: pt will perform bed mobility at min A level in preparation for adls  OT Frequency: Min 2X/week   Barriers to D/C:            Co-evaluation              AM-PAC OT "6 Clicks" Daily Activity     Outcome Measure Help from another person eating meals?: A Little Help from another person taking care of personal grooming?: A Lot Help from another person toileting, which includes using toliet, bedpan, or urinal?: Total Help from another person bathing (including washing, rinsing, drying)?: Total Help from another person to put on and taking off regular upper body clothing?: A Lot Help from another person to put on and taking off regular lower body clothing?: Total 6 Click Score: 10   End of Session    Activity Tolerance: Treatment limited secondary to agitation Patient left: in bed;with call bell/phone within reach;with bed alarm set;with nursing/sitter in room  OT Visit Diagnosis: Muscle weakness (generalized) (M62.81)                Time: 2025-4270 OT Time Calculation (min): 9 min Charges:  OT General Charges $OT Visit: 1 Visit OT Evaluation $OT Eval Low Complexity: [place], OTR/L Acute Rehabilitation Services [phone] [facility] pager [phone] office 12/28/2018  [place] 12/28/2018, 2:40 PM
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
## 2018-12-28 NOTE — Progress Notes (Signed)
Inpatient Diabetes Program Recommendations  AACE/ADA: New Consensus Statement on Inpatient Glycemic Control (2015)  Target Ranges:  Prepandial:   less than 140 mg/dL      Peak postprandial:   less than 180 mg/dL (1-2 hours)      Critically ill patients:  140 - 180 mg/dL   Lab Results  Component Value Date   GLUCAP 368 (H) 12/28/2018   HGBA1C 9.8 (H) 10/04/2018    Review of Glycemic Control  Diabetes history: DM2 Outpatient Diabetes medications: Lantus 30 units bid, Novolog 10 units tidwc Current orders for Inpatient glycemic control: Lantus 45 units bid, Novolog 0-20 units Q4H. Novolog 6 units tidwc.  HgbA1C - 9.8% - will speak with pt this am about glucose control at home.   Inpatient Diabetes Program Recommendations:     Change Novolog 0-20 units to tidwc and hs  Will follow.   Thank you. Lorenda Peck, RD, LDN, CDE Inpatient Diabetes Coordinator [phone]

## 2018-12-29 DIAGNOSIS — E118 Type 2 diabetes mellitus with unspecified complications: Secondary | ICD-10-CM

## 2018-12-29 LAB — GLUCOSE, CAPILLARY
GLUCOSE-CAPILLARY: 392 mg/dL — AB (ref 70–99)
Glucose-Capillary: 261 mg/dL — ABNORMAL HIGH (ref 70–99)
Glucose-Capillary: 340 mg/dL — ABNORMAL HIGH (ref 70–99)
Glucose-Capillary: 343 mg/dL — ABNORMAL HIGH (ref 70–99)
Glucose-Capillary: 372 mg/dL — ABNORMAL HIGH (ref 70–99)
Glucose-Capillary: 391 mg/dL — ABNORMAL HIGH (ref 70–99)
Glucose-Capillary: 402 mg/dL — ABNORMAL HIGH (ref 70–99)
Glucose-Capillary: 416 mg/dL — ABNORMAL HIGH (ref 70–99)

## 2018-12-29 LAB — CBC
HEMATOCRIT: 33.8 % — AB (ref 39.0–52.0)
Hemoglobin: 9.8 g/dL — ABNORMAL LOW (ref 13.0–17.0)
MCH: 27.7 pg (ref 26.0–34.0)
MCHC: 29 g/dL — ABNORMAL LOW (ref 30.0–36.0)
MCV: 95.5 fL (ref 80.0–100.0)
Platelets: 190 10*3/uL (ref 150–400)
RBC: 3.54 MIL/uL — ABNORMAL LOW (ref 4.22–5.81)
RDW: 17.2 % — AB (ref 11.5–15.5)
WBC: 10.7 10*3/uL — ABNORMAL HIGH (ref 4.0–10.5)
nRBC: 0.2 % (ref 0.0–0.2)

## 2018-12-29 LAB — BASIC METABOLIC PANEL
ANION GAP: 12 (ref 5–15)
BUN: 37 mg/dL — ABNORMAL HIGH (ref 8–23)
CO2: 30 mmol/L (ref 22–32)
Calcium: 8.4 mg/dL — ABNORMAL LOW (ref 8.9–10.3)
Chloride: 90 mmol/L — ABNORMAL LOW (ref 98–111)
Creatinine, Ser: 1.37 mg/dL — ABNORMAL HIGH (ref 0.61–1.24)
GFR calc Af Amer: 57 mL/min — ABNORMAL LOW (ref >60)
GFR calc non Af Amer: 49 mL/min — ABNORMAL LOW (ref >60)
Glucose, Bld: 333 mg/dL — ABNORMAL HIGH (ref 70–99)
Potassium: 3.6 mmol/L (ref 3.5–5.1)
Sodium: 132 mmol/L — ABNORMAL LOW (ref 135–145)

## 2018-12-29 MED ORDER — INSULIN GLARGINE 100 UNIT/ML ~~LOC~~ SOLN
75.0000 [IU] | Freq: Two times a day (BID) | SUBCUTANEOUS | Status: DC
  Administered 2018-12-29 – 2018-12-30 (×2): 75 [IU] via SUBCUTANEOUS
  Filled 2018-12-29 (×4): qty 0.75

## 2018-12-29 MED ORDER — INSULIN GLARGINE 100 UNIT/ML ~~LOC~~ SOLN
70.0000 [IU] | Freq: Two times a day (BID) | SUBCUTANEOUS | Status: DC
  Filled 2018-12-29: qty 0.7

## 2018-12-29 MED ORDER — INSULIN GLARGINE 100 UNIT/ML ~~LOC~~ SOLN
15.0000 [IU] | Freq: Once | SUBCUTANEOUS | Status: AC
  Administered 2018-12-29: 15 [IU] via SUBCUTANEOUS
  Filled 2018-12-29: qty 0.15

## 2018-12-29 MED ORDER — INSULIN ASPART 100 UNIT/ML ~~LOC~~ SOLN
30.0000 [IU] | Freq: Once | SUBCUTANEOUS | Status: AC
  Administered 2018-12-29: 30 [IU] via SUBCUTANEOUS

## 2018-12-29 MED ORDER — FUROSEMIDE 10 MG/ML IJ SOLN
60.0000 mg | Freq: Once | INTRAMUSCULAR | Status: AC
  Administered 2018-12-29: 60 mg via INTRAVENOUS
  Filled 2018-12-29: qty 6

## 2018-12-29 MED ORDER — INSULIN GLARGINE 100 UNIT/ML ~~LOC~~ SOLN
65.0000 [IU] | Freq: Two times a day (BID) | SUBCUTANEOUS | Status: DC
  Administered 2018-12-29: 65 [IU] via SUBCUTANEOUS
  Filled 2018-12-29 (×2): qty 0.65
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
## 2018-12-29 NOTE — Progress Notes (Signed)
PROGRESS NOTE                                                                                                                                                                                                             Patient Demographics:    [name], is a 78 y.o. male, DOB - June 15, 1941, QTM:[id number]  Admit date - 12/26/2018   Admitting Physician Nita Sells, MD  Outpatient Primary MD for the patient is [name], MD  LOS - 3   Chief Complaint  Patient presents with  . 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   Subjective:    [name] today ports his dyspnea has improved, reports he still having some cough, less productive .   Assessment  & Plan :    Active Problems:   COPD (chronic obstructive pulmonary disease) (HCC)   CAD (coronary artery disease)   Acute on chronic respiratory failure with hypoxia and hypercapnia (HCC)   Bipolar 1 disorder, mixed, moderate (HCC)   AKI (acute kidney injury) ([place])   Diabetes mellitus with complication (HCC)   Anemia due to chronic kidney disease   Acute on chronic diastolic CHF (congestive heart failure) (HCC)   Acute on chronic respiratory failure with hypoxia (HCC)   Hypoxia   Acute on chronic respiratory failure with hypoxemia and hypercapnia -He is on 4 L nasal cannula at baseline, ABG showing hypercapnia and hypoxemia on his baseline oxygen, this morning he was on 10 L nasal cannula, I was able to wean to 7 L. -Use incentive spirometry, flutter valve and started on  Mucinex -We will continue with IV diuresis, will give extra dose of 60 mg of Lasix today as it appears to be helping  Community-acquired pneumonia -Chest x-ray significant for pneumonia, continue with Rocephin and doxycycline  Staphylococcus coag negative blood cultures -Patient has 2 bottles of blood culture positive for Staphylococcus coag negative, this is most likely  contaminant, he is nontoxic-appearing, afebrile, repeat blood cultures were obtained 12/28/2018, so far with no growth to date, this is most likely contaminant and no indication to treat   COPD exacerbation -Patient on IV Solu-Medrol, wheezing significantly improved today, given his significant hyperglycemia will continue with Solu-Medrol to 40 mg IV every 12 hours  - continue with PPI for prophylaxis, scheduled duo nebs and PRN albuterol - Continue albuterol 2 puffs every 6 as needed Dulera 2 puffs twice daily Spiriva 18 mcg Patient may require long-acting beta agonist in addition  Acute on chronic CHF with a preserved EF -Patient clinically volume overload, peers to be improving with IV diuresis, will give another dose of IV Lasix today specially with stable renal function  AKI and CKD stage III -Secondary to diuresis, significantly volume overloaded, will continue with IV Lasix, hold nephrotoxic medication monitor renal function closely  Diabetes mellitus, type II, insulin-dependent, uncontrolled -Recent A1c was 9.8 last December, his CBG is severely uncontrolled currently, this is most likely due to steroids, is on 25 units Lantus twice daily, I have almost tripled his dose by today, despite that he remains with very poorly controlled diabetes mellitus, I will repeat A1c for tomorrow, continue with insulin sliding scale resistant level, continue with 10 units NovoLog before meals as well.  CAD status post stent  - No current chest pain-currently stable-ideally should be on beta-blocker ACE inhibitor-continue aspirin 81  daily  diabetic polyneuropathy -  can continue gabapentin 600 3 times daily  Prior GI bleeds with AVMs -Continue Protonix 40 daily, Carafate 1 g 3 times daily  Anemia of likely chronic disease - Continue ferrous sulfate 325 3 times daily folic acid 1 daily  Bipolar Continue Xanax 0.25 3 times daily as needed, citalopram 20 daily  Habitus for sleep apnea - Will need outpatient follow-up for the same  h/of aortic aneurysm repair - Stable at this time will need further work-up as outpatient        Code Status : full  Family Communication  : none at bedside  Disposition Plan  : Seen by PT, to recommend SNF, but patient declines and wants to go home  Barriers For Discharge : Remains  on IV steroids and diuresis  Consults  :  None  Procedures  : None  DVT Prophylaxis  :  [place] heparin  Lab Results  Component Value Date   PLT 190 12/29/2018    Antibiotics  :    Anti-infectives (From admission, onward)   Start     Dose/Rate Route Frequency Ordered Stop   12/28/18 1100  vancomycin (VANCOCIN) 2,000 mg in sodium chloride 0.9 % 500 mL IVPB  Status:  Discontinued     2,000 mg 250 mL/hr over 120 Minutes Intravenous  Once 12/28/18 1044 12/28/18 1059   12/27/18 1100  cefTRIAXone (ROCEPHIN) 2 g in sodium chloride 0.9 % 100 mL IVPB     2 g 200 mL/hr over 30 Minutes Intravenous Daily 12/27/18 1054     12/27/18 1100  doxycycline (VIBRAMYCIN) 100 mg in sodium chloride 0.9 % 250 mL IVPB     100 mg 125 mL/hr over 120 Minutes Intravenous 2 times daily 12/27/18 1054     12/26/18 1100  cefTRIAXone (ROCEPHIN) 1 g in sodium chloride 0.9 % 100 mL IVPB  Status:  Discontinued     1 g 200 mL/hr over 30 Minutes Intravenous Every 24 hours 12/26/18 0954 12/27/18 1054   12/26/18 0730  doxycycline (VIBRAMYCIN) 100 mg in sodium chloride  0.9 % 250 mL IVPB     100 mg 125 mL/hr over 120 Minutes Intravenous  Once 12/26/18 0725 12/26/18 1004        Objective:   Vitals:   12/28/18 1300  12/28/18 2026 12/29/18 0413 12/29/18 0831  BP: 111/60 133/66 134/82   Pulse: 98 92 92   Resp:  20 18   Temp: 98.6 F (37 C) 97.8 F (36.6 C) 97.8 F (36.6 C)   TempSrc: Oral Oral Oral   SpO2: 94% 96% 94% 94%  Weight:      Height:        Wt Readings from Last 3 Encounters:  12/26/18 129.2 kg  10/14/18 119.1 kg  10/06/18 123.3 kg     Intake/Output Summary (Last 24 hours) at 12/29/2018 1359 Last data filed at 12/29/2018 1250 Gross per 24 hour  Intake 1560.15 ml  Output 1875 ml  Net -314.85 ml     Physical Exam  Awake Alert, Oriented X 3, No new F.N deficits, Normal affect Symmetrical Chest wall movement, and is at the bases, has some scattered wheezing RRR,No Gallops,Rubs or new Murmurs, No Parasternal Heave +ve B.Sounds, Abd Soft, No tenderness, No rebound - guarding or rigidity. No Cyanosis, Clubbing, +1 edema, No new Rash or bruise       Data Review:    CBC Recent Labs  Lab 12/26/18 0608 12/27/18 0535 12/28/18 0534 12/29/18 0520  WBC 7.0 8.9 6.4 10.7*  HGB 9.9* 9.6* 9.7* 9.8*  HCT 34.4* 33.9* 33.4* 33.8*  PLT 160 181 120* 190  MCV 96.9 98.3 94.9 95.5  MCH 27.9 27.8 27.6 27.7  MCHC 28.8* 28.3* 29.0* 29.0*  RDW 17.4* 18.3* 17.4* 17.2*  LYMPHSABS 1.0  --   --   --   MONOABS 0.6  --   --   --   EOSABS 0.1  --   --   --   BASOSABS 0.1  --   --   --     Chemistries  Recent Labs  Lab 12/26/18 0608 12/27/18 0535 12/28/18 0534 12/29/18 0520  NA 134* 132* 134* 132*  K 4.6 5.3* 4.5 3.6  CL 96* 93* 90* 90*  CO2 33* 33* 32 30  GLUCOSE 333* 273* 391* 333*  BUN 15 20 26* 37*  CREATININE 1.08 1.43* 1.42* 1.37*  CALCIUM 8.5* 8.4* 8.1* 8.4*  AST  --  18  --   --   ALT  --  13  --   --   ALKPHOS  --  118  --   --   BILITOT  --  0.6  --   --    ------------------------------------------------------------------------------------------------------------------ No results for input(s): CHOL, HDL, LDLCALC, TRIG, CHOLHDL, LDLDIRECT in the last 72 hours.  Lab  Results  Component Value Date   HGBA1C 9.8 (H) 10/04/2018   ------------------------------------------------------------------------------------------------------------------ No results for input(s): TSH, T4TOTAL, T3FREE, THYROIDAB in the last 72 hours.  Invalid input(s): FREET3 ------------------------------------------------------------------------------------------------------------------ No results for input(s): VITAMINB12, FOLATE, FERRITIN, TIBC, IRON, RETICCTPCT in the last 72 hours.  Coagulation profile No results for input(s): INR, PROTIME in the last 168 hours.  No results for input(s): DDIMER in the last 72 hours.  Cardiac Enzymes Recent Labs  Lab 12/26/18 0608  TROPONINI <0.03   ------------------------------------------------------------------------------------------------------------------    Component Value Date/Time   BNP 236.6 (H) 12/26/2018 0608    Inpatient Medications  Scheduled Meds: . aspirin EC  81 mg Oral Daily  . citalopram  20 mg Oral Daily  . diltiazem  240 mg Oral Daily  . ferrous sulfate  325 mg Oral TID WC  . folic acid  1 mg Oral Daily  . furosemide  60 mg Intravenous Once  . gabapentin  600 mg Oral 3 times per day  . guaiFENesin  1,200 mg Oral BID  . heparin  5,000 Units Subcutaneous Q8H  . insulin aspart  0-20 Units Subcutaneous Q4H  . insulin aspart  10 Units Subcutaneous TID WC  . insulin glargine  15 Units Subcutaneous Once  . insulin glargine  75 Units Subcutaneous BID  . ipratropium-albuterol  3 mL Nebulization TID  . methylPREDNISolone (SOLU-MEDROL) injection  40 mg Intravenous Q12H  . mometasone-formoterol  2 puff Inhalation BID  . pantoprazole  40 mg Oral BID  . sucralfate  1 g Oral TID WC & HS   Continuous Infusions: . cefTRIAXone (ROCEPHIN)  IV 2 g (12/28/18 0912)  . doxycycline (VIBRAMYCIN) IV 100 mg (12/29/18 1144)   PRN Meds:.acetaminophen, albuterol, LORazepam  Micro Results Recent Results (from the past 240  hour(s))  Blood culture (routine x 2)     Status: None (Preliminary result)   Collection Time: 12/26/18  7:50 AM  Result Value Ref Range Status   Specimen Description   Final    BLOOD RIGHT ANTECUBITAL Performed at [facility] [address], [place], [place] [address]    Special Requests   Final    BOTTLES DRAWN AEROBIC AND ANAEROBIC Blood Culture adequate volume Performed at [facility] [address] [place], [place] [address]    Culture  Setup Time   Final    GRAM POSITIVE COCCI CRITICAL VALUE NOTED.  VALUE IS CONSISTENT WITH PREVIOUSLY REPORTED AND CALLED VALUE. Performed at [facility], [place] [address] [place], [place] [address]    Culture GRAM POSITIVE COCCI  Final   Report Status PENDING  Incomplete  Blood culture (routine x 2)     Status: Abnormal (Preliminary result)   Collection Time: 12/26/18  8:05 AM  Result Value Ref Range Status   Specimen Description   Final    BLOOD LEFT ANTECUBITAL Performed at [facility], [place] [address], [place], [place] [address]    Special Requests   Final    BOTTLES DRAWN AEROBIC AND ANAEROBIC Blood Culture adequate volume Performed at [facility] [address], [place], [place] [address]    Culture  Setup Time   Final    GRAM POSITIVE COCCI IN CLUSTERS AEROBIC BOTTLE ONLY CRITICAL RESULT CALLED TO, READ BACK BY AND VERIFIED WITH: Marcy Siren PharmD 9:50 12/27/18 ([email or web address]) Performed at [facility], [place] [address], [place], [place] [address]    Culture STAPHYLOCOCCUS SPECIES (COAGULASE NEGATIVE) (A)  Final   Report Status PENDING  Incomplete  Blood Culture ID Panel (Reflexed)     Status: Abnormal   Collection Time: 12/26/18  8:05 AM  Result Value Ref Range Status   Enterococcus species NOT DETECTED NOT DETECTED Final   Listeria monocytogenes NOT DETECTED NOT DETECTED Final   Staphylococcus species DETECTED (A) NOT DETECTED Final    Comment: Methicillin  (oxacillin) resistant coagulase negative staphylococcus. Possible blood culture contaminant (unless isolated from more than one blood culture draw or clinical case suggests pathogenicity). No antibiotic treatment is indicated for blood  culture contaminants. CRITICAL RESULT CALLED TO, READ BACK BY AND VERIFIED WITH: Marcy Siren PharmD 9:50 12/27/18 ([email or web address])    Staphylococcus aureus (BCID) NOT DETECTED NOT DETECTED Final  Methicillin resistance DETECTED (A) NOT DETECTED Final    Comment: CRITICAL RESULT CALLED TO, READ BACK BY AND VERIFIED WITH: Marcy Siren PharmD 9:50 12/27/18 ([email or web address])    Streptococcus species NOT DETECTED NOT DETECTED Final   Streptococcus agalactiae NOT DETECTED NOT DETECTED Final   Streptococcus pneumoniae NOT DETECTED NOT DETECTED Final   Streptococcus pyogenes NOT DETECTED NOT DETECTED Final   Acinetobacter baumannii NOT DETECTED NOT DETECTED Final   Enterobacteriaceae species NOT DETECTED NOT DETECTED Final   Enterobacter cloacae complex NOT DETECTED NOT DETECTED Final   Escherichia coli NOT DETECTED NOT DETECTED Final   Klebsiella oxytoca NOT DETECTED NOT DETECTED Final   Klebsiella pneumoniae NOT DETECTED NOT DETECTED Final   Proteus species NOT DETECTED NOT DETECTED Final   Serratia marcescens NOT DETECTED NOT DETECTED Final   Haemophilus influenzae NOT DETECTED NOT DETECTED Final   Neisseria meningitidis NOT DETECTED NOT DETECTED Final   Pseudomonas aeruginosa NOT DETECTED NOT DETECTED Final   Candida albicans NOT DETECTED NOT DETECTED Final   Candida glabrata NOT DETECTED NOT DETECTED Final   Candida krusei NOT DETECTED NOT DETECTED Final   Candida parapsilosis NOT DETECTED NOT DETECTED Final   Candida tropicalis NOT DETECTED NOT DETECTED Final    Comment: Performed at [facility], [place] [address] [place], [place] [address]    Radiology Reports Dg Chest 2 View  Result Date: 12/26/2018 CLINICAL DATA:  Increasing shortness of breath over several  days EXAM: CHEST - 2 VIEW COMPARISON:  12/26/2018 FINDINGS: Cardiac shadow is prominent but stable. Postsurgical changes are again seen. Right basilar atelectasis is noted. Persistent left basilar infiltrate is seen. No acute bony abnormality is noted. IMPRESSION: Persistent left basilar infiltrate. Electronically Signed   By: Inez Catalina M.D.   On: 12/26/2018 09:30   Dg Chest Portable 1 View  Result Date: 12/26/2018 CLINICAL DATA:  Shortness of breath and hyperglycemia EXAM: PORTABLE CHEST 1 VIEW COMPARISON:  Chest radiograph October 11, 2018 and chest CT October 13, 2018 FINDINGS: Bullous disease in the upper lobes is again noted. There is left lower lobe airspace consolidation with small left pleural effusion. The right lung is clear. Heart is mildly enlarged. The pulmonary vascularity is stable with diminished vascularity to the upper lobes due to the bullous disease. Patient is status post coronary artery bypass grafting. There is aortic atherosclerosis. No adenopathy. No bone lesions. IMPRESSION: Upper lobe bullous disease. Left lower lobe consolidation consistent with pneumonia with small left pleural effusion. Stable cardiac prominence. Status post coronary artery bypass grafting. Aortic Atherosclerosis (ICD10-I70.0). Electronically Signed   By: Lowella Grip III M.D.   On: 12/26/2018 07:02     Phillips Climes M.D on 12/29/2018 at 1:59 PM  Between 7am to 7pm - Pager - [phone]  After 7pm go to [email or web address] - password [facility]  [facility] -  Office  [phone]

## 2018-12-29 NOTE — Progress Notes (Signed)
Pt's 0000 CBG 416. On call provider made aware. Verbal order to give 30 units of novolog. This RN will cary out order and continue to monitor closely.

## 2018-12-29 NOTE — Progress Notes (Signed)
Pt upset with blood sugar being uncontrolled. Pt blaming staff. Pt educated on why his blood sugars are running high. On call provider made aware. Insulin coverage added for night time. Per Sherral Hammers MD, give Lantus dose at 2200 and begin sliding scale with 12 o'clock CBG check. Will continue to monitor.

## 2018-12-30 LAB — CULTURE, BLOOD (ROUTINE X 2)
Special Requests: ADEQUATE
Special Requests: ADEQUATE

## 2018-12-30 LAB — BASIC METABOLIC PANEL
Anion gap: 13 (ref 5–15)
BUN: 35 mg/dL — AB (ref 8–23)
CO2: 30 mmol/L (ref 22–32)
Calcium: 8.2 mg/dL — ABNORMAL LOW (ref 8.9–10.3)
Chloride: 92 mmol/L — ABNORMAL LOW (ref 98–111)
Creatinine, Ser: 1.21 mg/dL (ref 0.61–1.24)
GFR calc Af Amer: >60 mL/min (ref >60)
GFR calc non Af Amer: 57 mL/min — ABNORMAL LOW (ref >60)
Glucose, Bld: 257 mg/dL — ABNORMAL HIGH (ref 70–99)
Potassium: 3.8 mmol/L (ref 3.5–5.1)
Sodium: 135 mmol/L (ref 135–145)

## 2018-12-30 LAB — GLUCOSE, CAPILLARY
Glucose-Capillary: 237 mg/dL — ABNORMAL HIGH (ref 70–99)
Glucose-Capillary: 249 mg/dL — ABNORMAL HIGH (ref 70–99)
Glucose-Capillary: 250 mg/dL — ABNORMAL HIGH (ref 70–99)
Glucose-Capillary: 291 mg/dL — ABNORMAL HIGH (ref 70–99)
Glucose-Capillary: 317 mg/dL — ABNORMAL HIGH (ref 70–99)
Glucose-Capillary: 337 mg/dL — ABNORMAL HIGH (ref 70–99)
Glucose-Capillary: 344 mg/dL — ABNORMAL HIGH (ref 70–99)

## 2018-12-30 LAB — HEMOGLOBIN A1C
Hgb A1c MFr Bld: 10.2 % — ABNORMAL HIGH (ref 4.8–5.6)
Mean Plasma Glucose: 246.04 mg/dL

## 2018-12-30 LAB — CBC
HCT: 33.2 % — ABNORMAL LOW (ref 39.0–52.0)
Hemoglobin: 9.7 g/dL — ABNORMAL LOW (ref 13.0–17.0)
MCH: 28 pg (ref 26.0–34.0)
MCHC: 29.2 g/dL — ABNORMAL LOW (ref 30.0–36.0)
MCV: 96 fL (ref 80.0–100.0)
Platelets: 187 10*3/uL (ref 150–400)
RBC: 3.46 MIL/uL — ABNORMAL LOW (ref 4.22–5.81)
RDW: 17.5 % — ABNORMAL HIGH (ref 11.5–15.5)
WBC: 7.3 10*3/uL (ref 4.0–10.5)
nRBC: 0.4 % — ABNORMAL HIGH (ref 0.0–0.2)

## 2018-12-30 MED ORDER — FUROSEMIDE 10 MG/ML IJ SOLN: 60.0000 mg | Freq: Four times a day (QID) | INTRAMUSCULAR | Status: DC

## 2018-12-30 MED ORDER — PREDNISONE 5 MG PO TABS
30.0000 mg | ORAL_TABLET | Freq: Every day | ORAL | Status: DC
  Administered 2018-12-30 – 2018-12-31 (×2): 30 mg via ORAL
  Filled 2018-12-30 (×2): qty 1

## 2018-12-30 MED ORDER — POTASSIUM CHLORIDE CRYS ER 20 MEQ PO TBCR
40.0000 meq | EXTENDED_RELEASE_TABLET | Freq: Once | ORAL | Status: AC
  Administered 2018-12-30: 40 meq via ORAL
  Filled 2018-12-30: qty 2

## 2018-12-30 MED ORDER — INSULIN ASPART 100 UNIT/ML ~~LOC~~ SOLN
0.0000 [IU] | Freq: Three times a day (TID) | SUBCUTANEOUS | Status: DC
  Administered 2018-12-30: 17 [IU] via SUBCUTANEOUS
  Administered 2018-12-31: 7 [IU] via SUBCUTANEOUS
  Administered 2018-12-31: 4 [IU] via SUBCUTANEOUS

## 2018-12-30 MED ORDER — INSULIN GLARGINE 100 UNIT/ML ~~LOC~~ SOLN
5.0000 [IU] | Freq: Once | SUBCUTANEOUS | Status: AC
  Administered 2018-12-30: 5 [IU] via SUBCUTANEOUS
  Filled 2018-12-30: qty 0.05

## 2018-12-30 MED ORDER — FUROSEMIDE 40 MG PO TABS
80.0000 mg | ORAL_TABLET | Freq: Four times a day (QID) | ORAL | Status: AC
  Administered 2018-12-30 (×2): 80 mg via ORAL
  Filled 2018-12-30 (×2): qty 2

## 2018-12-30 MED ORDER — ALPRAZOLAM 0.25 MG PO TABS: 0.2500 mg | ORAL_TABLET | Freq: Every evening | ORAL | Status: DC | PRN

## 2018-12-30 MED ORDER — ALPRAZOLAM 0.25 MG PO TABS
0.2500 mg | ORAL_TABLET | Freq: Every evening | ORAL | Status: DC | PRN
  Administered 2018-12-30 (×2): 0.25 mg via ORAL
  Filled 2018-12-30 (×2): qty 1

## 2018-12-30 MED ORDER — INSULIN GLARGINE 100 UNIT/ML ~~LOC~~ SOLN
80.0000 [IU] | Freq: Two times a day (BID) | SUBCUTANEOUS | Status: DC
  Administered 2018-12-30 – 2018-12-31 (×2): 80 [IU] via SUBCUTANEOUS
  Filled 2018-12-30 (×3): qty 0.8

## 2018-12-30 NOTE — Progress Notes (Signed)
PROGRESS NOTE                                                                                                                                                                                                             Patient Demographics:    Leonardo Makris, is a 78 y.o. male, DOB - [birth year], XLK:[id number]  Admit date - 12/26/2018   Admitting Physician Nita Sells, MD  Outpatient Primary MD for the patient is [name], MD  LOS - 4   Chief Complaint  Patient presents with  . 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   Subjective:    Joel Cowin today ports his dyspnea has improved, reports he still having some cough, less productive .   Assessment  & Plan :    Active Problems:   COPD (chronic obstructive pulmonary disease) (HCC)   CAD (coronary artery disease)   Acute on chronic respiratory failure with hypoxia and hypercapnia (HCC)   Bipolar 1 disorder, mixed, moderate (HCC)   AKI (acute kidney injury) ([place])   Diabetes mellitus with complication (HCC)   Anemia due to chronic kidney disease   Acute on chronic diastolic CHF (congestive heart failure) (HCC)   Acute on chronic respiratory failure with hypoxia (HCC)   Hypoxia   Acute on chronic respiratory failure with hypoxemia and hypercapnia -He is on 4 L nasal cannula at baseline, ABG showing hypercapnia and hypoxemia on his baseline oxygen, this morning he was on 7 L nasal cannula, I have weaned to 5 L nasal cannula, will follow closely -Use incentive spirometry,  flutter valve and started on Mucinex -Diuresis has been helping, I will give him IV Lasix 60 mg every 6 hours x 2 today  Community-acquired pneumonia -Chest x-ray significant for pneumonia, continue with Rocephin and doxycycline  Staphylococcus coag negative blood cultures -Patient has 2 bottles of blood culture positive for Staphylococcus coag negative, this is most likely contaminant,  he is nontoxic-appearing, afebrile, repeat blood cultures were obtained 12/28/2018, so far with no growth to date, this is most likely contaminant and no indication to treat   COPD exacerbation -Patient with significant wheezing, requiring IV steroids initially, and this is improving, I have transitioned him to p.o. prednisone this morning given his hyperglycemia . - Continue albuterol 2 puffs every 6 as needed Dulera 2 puffs twice daily Spiriva 18 mcg Patient may require long-acting beta agonist in addition  Acute on chronic CHF with a preserved EF -Patient clinically volume overload, Appears to be improving with IV diuresis, IV Lasix 60 mg 6H x2 doses today as his renal function remained stable.  AKI and CKD stage III -Secondary to diuresis, significantly volume overloaded, will continue with IV Lasix, hold nephrotoxic medication monitor renal function closely  Diabetes mellitus, type II, insulin-dependent, uncontrolled -A1c is 10.2 , his CBG is severely uncontrolled currently, this is most likely due to steroids, he is on 25 units Lantus twice daily at home, I have increased him to 80 units subcu Lantus twice daily.  CAD status post stent  - No current chest pain-currently stable-ideally should be on beta-blocker ACE inhibitor-continue aspirin 81 daily  diabetic polyneuropathy -  can continue gabapentin 600 mg 3 times daily  Prior GI bleeds with AVMs -Continue Protonix 40 daily, Carafate 1 g 3 times daily  Anemia of likely chronic disease - Continue ferrous sulfate 325 3 times daily folic acid 1  daily  Bipolar Continue Xanax 0.25 3 times daily as needed, citalopram 20 daily  Habitus for sleep apnea - Will need outpatient follow-up for the same  h/of aortic aneurysm repair - Stable at this time will need further work-up as outpatient        Code Status : full  Family Communication  : none at bedside  Disposition Plan  : Seen by PT, to recommend SNF, but patient declines and wants to go home  Barriers For Discharge : Remains IV diuresis, 4 L nasal cannula this morning  Consults  :  None  Procedures  : None  DVT Prophylaxis  :  [place] heparin  Lab Results  Component Value Date   PLT 187 12/30/2018    Antibiotics  :    Anti-infectives (From admission, onward)   Start     Dose/Rate Route Frequency Ordered Stop   12/28/18 1100  vancomycin (VANCOCIN) 2,000 mg in sodium chloride 0.9 % 500 mL IVPB  Status:  Discontinued     2,000 mg 250 mL/hr over 120 Minutes Intravenous  Once 12/28/18 1044 12/28/18 1059   12/27/18 1100  cefTRIAXone (ROCEPHIN) 2 g in sodium chloride 0.9 % 100 mL IVPB     2 g 200 mL/hr over 30 Minutes Intravenous Daily 12/27/18 1054     12/27/18 1100  doxycycline (VIBRAMYCIN) 100 mg in sodium chloride 0.9 % 250 mL IVPB     100 mg 125 mL/hr over 120 Minutes Intravenous 2 times daily 12/27/18 1054     12/26/18 1100  cefTRIAXone (ROCEPHIN) 1 g in sodium chloride 0.9 % 100 mL IVPB  Status:  Discontinued     1 g 200 mL/hr over 30 Minutes Intravenous Every 24 hours 12/26/18 0954 12/27/18 1054   12/26/18 0730  doxycycline (VIBRAMYCIN) 100 mg in sodium chloride 0.9 % 250 mL IVPB     100 mg 125 mL/hr over 120 Minutes Intravenous  Once 12/26/18 0725 12/26/18 1004        Objective:   Vitals:  12/29/18 2100 12/30/18 0442 12/30/18 0815 12/30/18 1237  BP: (!) 145/75 (!) 145/91  (!) 147/85  Pulse: 86 80  84  Resp: 18 16  20   Temp: 97.9 F (36.6 C) 97.6 F (36.4 C)  97.8 F (36.6 C)  TempSrc: Oral Oral  Oral  SpO2: 95% 93% 93% 90%  Weight:       Height:        Wt Readings from Last 3 Encounters:  12/26/18 129.2 kg  10/14/18 119.1 kg  10/06/18 123.3 kg     Intake/Output Summary (Last 24 hours) at 12/30/2018 1424 Last data filed at 12/30/2018 0904 Gross per 24 hour  Intake 1883.72 ml  Output 5200 ml  Net -3316.28 ml     Physical Exam  Awake Alert, Oriented X 3, No new F.N deficits, Normal affect Symmetrical Chest wall movement, air entry with scattered wheezing RRR,No Gallops,Rubs or new Murmurs, No Parasternal Heave +ve B.Sounds, Abd Soft, No tenderness, No rebound - guarding or rigidity. No Cyanosis, Clubbing, has trace edema, No new Rash or bruise       Data Review:    CBC Recent Labs  Lab 12/26/18 0608 12/27/18 0535 12/28/18 0534 12/29/18 0520 12/30/18 0523  WBC 7.0 8.9 6.4 10.7* 7.3  HGB 9.9* 9.6* 9.7* 9.8* 9.7*  HCT 34.4* 33.9* 33.4* 33.8* 33.2*  PLT 160 181 120* 190 187  MCV 96.9 98.3 94.9 95.5 96.0  MCH 27.9 27.8 27.6 27.7 28.0  MCHC 28.8* 28.3* 29.0* 29.0* 29.2*  RDW 17.4* 18.3* 17.4* 17.2* 17.5*  LYMPHSABS 1.0  --   --   --   --   MONOABS 0.6  --   --   --   --   EOSABS 0.1  --   --   --   --   BASOSABS 0.1  --   --   --   --     Chemistries  Recent Labs  Lab 12/26/18 0608 12/27/18 0535 12/28/18 0534 12/29/18 0520 12/30/18 0523  NA 134* 132* 134* 132* 135  K 4.6 5.3* 4.5 3.6 3.8  CL 96* 93* 90* 90* 92*  CO2 33* 33* 32 30 30  GLUCOSE 333* 273* 391* 333* 257*  BUN 15 20 26* 37* 35*  CREATININE 1.08 1.43* 1.42* 1.37* 1.21  CALCIUM 8.5* 8.4* 8.1* 8.4* 8.2*  AST  --  18  --   --   --   ALT  --  13  --   --   --   ALKPHOS  --  118  --   --   --   BILITOT  --  0.6  --   --   --    ------------------------------------------------------------------------------------------------------------------ No results for input(s): CHOL, HDL, LDLCALC, TRIG, CHOLHDL, LDLDIRECT in the last 72 hours.  Lab Results  Component Value Date   HGBA1C 10.2 (H) 12/30/2018    ------------------------------------------------------------------------------------------------------------------ No results for input(s): TSH, T4TOTAL, T3FREE, THYROIDAB in the last 72 hours.  Invalid input(s): FREET3 ------------------------------------------------------------------------------------------------------------------ No results for input(s): VITAMINB12, FOLATE, FERRITIN, TIBC, IRON, RETICCTPCT in the last 72 hours.  Coagulation profile No results for input(s): INR, PROTIME in the last 168 hours.  No results for input(s): DDIMER in the last 72 hours.  Cardiac Enzymes Recent Labs  Lab 12/26/18 0608  TROPONINI <0.03   ------------------------------------------------------------------------------------------------------------------    Component Value Date/Time   BNP 236.6 (H) 12/26/2018 0608    Inpatient Medications  Scheduled Meds: . aspirin EC  81 mg Oral Daily  . citalopram  20 mg Oral Daily  . diltiazem  240 mg Oral Daily  . ferrous sulfate  325 mg Oral TID WC  . folic acid  1 mg Oral Daily  . gabapentin  600 mg Oral 3 times per day  . guaiFENesin  1,200 mg Oral BID  . heparin  5,000 Units Subcutaneous Q8H  . insulin aspart  0-20 Units Subcutaneous Q4H  . insulin aspart  10 Units Subcutaneous TID WC  . insulin glargine  75 Units Subcutaneous BID  . ipratropium-albuterol  3 mL Nebulization TID  . mometasone-formoterol  2 puff Inhalation BID  . pantoprazole  40 mg Oral BID  . predniSONE  30 mg Oral Q breakfast  . sucralfate  1 g Oral TID WC & HS   Continuous Infusions: . cefTRIAXone (ROCEPHIN)  IV 2 g (12/30/18 0910)  . doxycycline (VIBRAMYCIN) IV 100 mg (12/30/18 1025)   PRN Meds:.acetaminophen, albuterol, ALPRAZolam  Micro Results Recent Results (from the past 240 hour(s))  Blood culture (routine x 2)     Status: Abnormal   Collection Time: 12/26/18  7:50 AM  Result Value Ref Range Status   Specimen Description   Final    BLOOD RIGHT  ANTECUBITAL Performed at [facility] [address] [place], [place] [address]    Special Requests   Final    BOTTLES DRAWN AEROBIC AND ANAEROBIC Blood Culture adequate volume Performed at [facility] [address] [place], [place] [address]    Culture  Setup Time   Final    GRAM POSITIVE COCCI CRITICAL VALUE NOTED.  VALUE IS CONSISTENT WITH PREVIOUSLY REPORTED AND CALLED VALUE.    Culture (A)  Final    STAPHYLOCOCCUS AURICULARIS THE SIGNIFICANCE OF ISOLATING THIS ORGANISM FROM A SINGLE SET OF BLOOD CULTURES WHEN MULTIPLE SETS ARE DRAWN IS UNCERTAIN. PLEASE NOTIFY THE MICROBIOLOGY DEPARTMENT WITHIN ONE WEEK IF SPECIATION AND SENSITIVITIES ARE REQUIRED. Performed at [facility], [place] [address], [place], [place] [address]    Report Status 12/30/2018 FINAL  Final  Blood culture (routine x 2)     Status: Abnormal   Collection Time: 12/26/18  8:05 AM  Result Value Ref Range Status   Specimen Description   Final    BLOOD LEFT ANTECUBITAL Performed at [facility], [place] [address] [place], [place] [address]    Special Requests   Final    BOTTLES DRAWN AEROBIC AND ANAEROBIC Blood Culture adequate volume Performed at [facility] [address], [place],  [address]    Culture  Setup Time   Final    GRAM POSITIVE COCCI IN CLUSTERS AEROBIC BOTTLE ONLY CRITICAL RESULT CALLED TO, READ BACK BY AND VERIFIED WITH: Marcy Siren PharmD 9:50 12/27/18 ([email or web address])    Culture (A)  Final    STAPHYLOCOCCUS CAPITIS STAPHYLOCOCCUS EPIDERMIDIS THE SIGNIFICANCE OF ISOLATING THIS ORGANISM FROM A SINGLE SET OF BLOOD CULTURES WHEN MULTIPLE SETS ARE DRAWN IS UNCERTAIN. PLEASE NOTIFY THE MICROBIOLOGY DEPARTMENT WITHIN ONE WEEK IF SPECIATION AND SENSITIVITIES ARE REQUIRED. Performed at [facility], [place] [address] [place],  [address]    Report Status 12/30/2018 FINAL  Final  Blood Culture ID Panel (Reflexed)      Status: Abnormal   Collection Time: 12/26/18  8:05 AM  Result Value Ref Range Status   Enterococcus species NOT DETECTED NOT DETECTED Final   Listeria monocytogenes NOT DETECTED NOT DETECTED Final   Staphylococcus species DETECTED (A) NOT DETECTED Final  Comment: Methicillin (oxacillin) resistant coagulase negative staphylococcus. Possible blood culture contaminant (unless isolated from more than one blood culture draw or clinical case suggests pathogenicity). No antibiotic treatment is indicated for blood  culture contaminants. CRITICAL RESULT CALLED TO, READ BACK BY AND VERIFIED WITH: Marcy Siren PharmD 9:50 12/27/18 ([email or web address])    Staphylococcus aureus (BCID) NOT DETECTED NOT DETECTED Final   Methicillin resistance DETECTED (A) NOT DETECTED Final    Comment: CRITICAL RESULT CALLED TO, READ BACK BY AND VERIFIED WITH: Marcy Siren PharmD 9:50 12/27/18 ([email or web address])    Streptococcus species NOT DETECTED NOT DETECTED Final   Streptococcus agalactiae NOT DETECTED NOT DETECTED Final   Streptococcus pneumoniae NOT DETECTED NOT DETECTED Final   Streptococcus pyogenes NOT DETECTED NOT DETECTED Final   Acinetobacter baumannii NOT DETECTED NOT DETECTED Final   Enterobacteriaceae species NOT DETECTED NOT DETECTED Final   Enterobacter cloacae complex NOT DETECTED NOT DETECTED Final   Escherichia coli NOT DETECTED NOT DETECTED Final   Klebsiella oxytoca NOT DETECTED NOT DETECTED Final   Klebsiella pneumoniae NOT DETECTED NOT DETECTED Final   Proteus species NOT DETECTED NOT DETECTED Final   Serratia marcescens NOT DETECTED NOT DETECTED Final   Haemophilus influenzae NOT DETECTED NOT DETECTED Final   Neisseria meningitidis NOT DETECTED NOT DETECTED Final   Pseudomonas aeruginosa NOT DETECTED NOT DETECTED Final   Candida albicans NOT DETECTED NOT DETECTED Final   Candida glabrata NOT DETECTED NOT DETECTED Final   Candida krusei NOT DETECTED NOT DETECTED Final   Candida parapsilosis NOT DETECTED NOT  DETECTED Final   Candida tropicalis NOT DETECTED NOT DETECTED Final    Comment: Performed at [facility], [place]. [address], [place], [place] [address]  Culture, blood (Routine X 2) w Reflex to ID Panel     Status: None (Preliminary result)   Collection Time: 12/28/18 10:39 AM  Result Value Ref Range Status   Specimen Description   Final    BLOOD RIGHT HAND Performed at [facility] [address] [place], [place] [address]    Special Requests   Final    BOTTLES DRAWN AEROBIC ONLY Blood Culture results may not be optimal due to an inadequate volume of blood received in culture bottles Performed at [facility] [address], [place], [place] [address]    Culture   Final    NO GROWTH 2 DAYS Performed at [facility] [address] [place], [place] [address]    Report Status PENDING  Incomplete  Culture, blood (Routine X 2) w Reflex to ID Panel     Status: None (Preliminary result)   Collection Time: 12/28/18 10:46 AM  Result Value Ref Range Status   Specimen Description   Final    BLOOD LEFT HAND Performed at [facility] [address], [place], [place] [address]    Special Requests   Final    BOTTLES DRAWN AEROBIC ONLY Blood Culture adequate volume Performed at [facility] [address], [place], [place] [address]    Culture   Final    NO GROWTH 2 DAYS Performed at [facility] [address], [place], [place] [address]    Report Status PENDING  Incomplete    Radiology Reports Dg Chest 2 View  Result Date: 12/26/2018 CLINICAL DATA:  Increasing shortness of breath over several days EXAM: CHEST - 2 VIEW COMPARISON:  12/26/2018 FINDINGS: Cardiac shadow is prominent but stable. Postsurgical changes are again seen. Right basilar  atelectasis is noted. Persistent left basilar infiltrate is seen. No acute bony abnormality is noted. IMPRESSION: Persistent left basilar infiltrate.  Electronically Signed   By: Inez Catalina M.D.   On: 12/26/2018 09:30   Dg Chest Portable 1 View  Result Date: 12/26/2018 CLINICAL DATA:  Shortness of breath and hyperglycemia EXAM: PORTABLE CHEST 1 VIEW COMPARISON:  Chest radiograph October 11, 2018 and chest CT October 13, 2018 FINDINGS: Bullous disease in the upper lobes is again noted. There is left lower lobe airspace consolidation with small left pleural effusion. The right lung is clear. Heart is mildly enlarged. The pulmonary vascularity is stable with diminished vascularity to the upper lobes due to the bullous disease. Patient is status post coronary artery bypass grafting. There is aortic atherosclerosis. No adenopathy. No bone lesions. IMPRESSION: Upper lobe bullous disease. Left lower lobe consolidation consistent with pneumonia with small left pleural effusion. Stable cardiac prominence. Status post coronary artery bypass grafting. Aortic Atherosclerosis (ICD10-I70.0). Electronically Signed   By: Lowella Grip III M.D.   On: 12/26/2018 07:02     Phillips Climes M.D on 12/30/2018 at 2:24 PM  Between 7am to 7pm - Pager - [phone]  After 7pm go to [email or web address] - password [facility]  [facility] -  Office  [phone]

## 2018-12-30 NOTE — Progress Notes (Signed)
Patient breathing easy on 7 liters high flow [place], no sob noted, lungs diminished but clear, edema noted to BLE, right foot greater than left, lotion applied to BLE per patient request. Patient is cooperative with care but can be inappropriate at times. No violent behavior noted. Scratch marks in various stages of healing noted to all extremities. Patient states he "picks" at his skin because of his "OCD". Last CBG 372, lantus and sliding scale coverage given per [facility], patient requesting jello and graham crackers. Sugar free jello given but told him no crackers due to elevated CBG. Patient was accepting of that. Will continue to monitor.

## 2018-12-30 NOTE — Plan of Care (Signed)
  Problem: Health Behavior/Discharge Planning: Goal: Ability to manage health-related needs will improve Outcome: Progressing   Problem: Clinical Measurements: Goal: Ability to maintain clinical measurements within normal limits will improve Outcome: Progressing Goal: Will remain free from infection Outcome: Progressing Goal: Diagnostic test results will improve Outcome: Progressing Goal: Respiratory complications will improve Outcome: Progressing Goal: Cardiovascular complication will be avoided Outcome: Progressing   Problem: Activity: Goal: Risk for activity intolerance will decrease Outcome: Progressing   Problem: Coping: Goal: Level of anxiety will decrease Outcome: Progressing   Problem: Elimination: Goal: Will not experience complications related to bowel motility Outcome: Progressing Goal: Will not experience complications related to urinary retention Outcome: Progressing   Problem: Pain Managment: Goal: General experience of comfort will improve Outcome: Progressing

## 2018-12-31 DIAGNOSIS — J441 Chronic obstructive pulmonary disease with (acute) exacerbation: Secondary | ICD-10-CM

## 2018-12-31 DIAGNOSIS — J189 Pneumonia, unspecified organism: Secondary | ICD-10-CM

## 2018-12-31 DIAGNOSIS — J181 Lobar pneumonia, unspecified organism: Secondary | ICD-10-CM

## 2018-12-31 LAB — GLUCOSE, CAPILLARY
Glucose-Capillary: 202 mg/dL — ABNORMAL HIGH (ref 70–99)
Glucose-Capillary: 156 mg/dL — ABNORMAL HIGH (ref 70–99)
Glucose-Capillary: 236 mg/dL — ABNORMAL HIGH (ref 70–99)

## 2018-12-31 MED ORDER — INSULIN GLARGINE 100 UNIT/ML ~~LOC~~ SOLN: 55.0000 [IU] | Freq: Two times a day (BID) | SUBCUTANEOUS | 1 refills | Status: DC

## 2018-12-31 MED ORDER — IPRATROPIUM-ALBUTEROL 0.5-2.5 (3) MG/3ML IN SOLN: RESPIRATORY_TRACT | 1 refills | Status: DC

## 2018-12-31 MED ORDER — PREDNISONE 10 MG (21) PO TBPK: ORAL_TABLET | ORAL | 0 refills | Status: DC

## 2018-12-31 MED ORDER — GUAIFENESIN ER 600 MG PO TB12: 1200.0000 mg | ORAL_TABLET | Freq: Two times a day (BID) | ORAL | 0 refills | Status: DC

## 2018-12-31 MED ORDER — ACETAMINOPHEN 325 MG PO TABS: 650.0000 mg | ORAL_TABLET | Freq: Four times a day (QID) | ORAL | Status: DC | PRN

## 2018-12-31 MED ORDER — FUROSEMIDE 20 MG PO TABS: 60.0000 mg | ORAL_TABLET | Freq: Every day | ORAL | 0 refills | Status: DC

## 2018-12-31 MED ORDER — DOXYCYCLINE HYCLATE 100 MG PO CAPS: 100.0000 mg | ORAL_CAPSULE | Freq: Two times a day (BID) | ORAL | 0 refills | Status: DC

## 2018-12-31 MED ORDER — DOXYCYCLINE HYCLATE 100 MG PO CAPS: 100.0000 mg | ORAL_CAPSULE | Freq: Two times a day (BID) | ORAL | 0 refills | Status: AC

## 2018-12-31 MED ORDER — INSULIN ASPART 100 UNIT/ML ~~LOC~~ SOLN: 10.0000 [IU] | Freq: Three times a day (TID) | SUBCUTANEOUS | Status: DC

## 2018-12-31 MED ORDER — CEFPODOXIME PROXETIL 200 MG PO TABS: 200.0000 mg | ORAL_TABLET | Freq: Two times a day (BID) | ORAL | 0 refills | Status: DC

## 2018-12-31 MED ORDER — ALBUTEROL SULFATE (2.5 MG/3ML) 0.083% IN NEBU: 2.5000 mg | INHALATION_SOLUTION | RESPIRATORY_TRACT | 2 refills | Status: DC | PRN

## 2018-12-31 MED ORDER — CEFPODOXIME PROXETIL 200 MG PO TABS: 200.0000 mg | ORAL_TABLET | Freq: Two times a day (BID) | ORAL | 0 refills | Status: AC

## 2018-12-31 NOTE — Discharge Instructions (Signed)
Follow with Primary MD [name], MD in 7 days   Get CBC, CMP, 2 view Chest X ray checked  by Primary MD next visit.    Activity: As tolerated with Full fall precautions use walker/cane & assistance as needed   Disposition Home    Diet: Heart Healthy , carbohydrate modified, with feeding assistance and aspiration precautions.  For Heart failure patients - Check your Weight same time everyday, if you gain over 2 pounds, or you develop in leg swelling, experience more shortness of breath or chest pain, call your Primary MD immediately. Follow Cardiac Low Salt Diet and 1.5 lit/day fluid restriction.   On your next visit with your primary care physician please Get Medicines reviewed and adjusted.   Please request your Prim.MD to go over all Hospital Tests and Procedure/Radiological results at the follow up, please get all Hospital records sent to your Prim MD by signing hospital release before you go home.   If you experience worsening of your admission symptoms, develop shortness of breath, life threatening emergency, suicidal or homicidal thoughts you must seek medical attention immediately by calling 911 or calling your MD immediately  if symptoms less severe.  You Must read complete instructions/literature along with all the possible adverse reactions/side effects for all the Medicines you take and that have been prescribed to you. Take any new Medicines after you have completely understood and accpet all the possible adverse reactions/side effects.   Do not drive, operating heavy machinery, perform activities at heights, swimming or participation in water activities or provide baby sitting services if your were admitted for syncope or siezures until you have seen by Primary MD or a Neurologist and advised to do so again.  Do not drive when taking Pain medications.    Do not take more than prescribed Pain, Sleep and Anxiety Medications  Special Instructions: If you have smoked  or chewed Tobacco  in the last 2 yrs please stop smoking, stop any regular Alcohol  and or any Recreational drug use.  Wear Seat belts while driving.   Please note  You were cared for by a hospitalist during your hospital stay. If you have any questions about your discharge medications or the care you received while you were in the hospital after you are discharged, you can call the unit and asked to speak with the hospitalist on call if the hospitalist that took care of you is not available. Once you are discharged, your primary care physician will handle any further medical issues. Please note that NO REFILLS for any discharge medications will be authorized once you are discharged, as it is imperative that you return to your primary care physician (or establish a relationship with a primary care physician if you do not have one) for your aftercare needs so that they can reassess your need for medications and monitor your lab values.

## 2018-12-31 NOTE — Care Management Important Message (Signed)
Important Message  Patient Details  Name: [name] MRN: [id number] Date of Birth: 06/03/1941   Medicare Important Message Given:  Yes    Kerin Salen 12/31/2018, 1:32 PMImportant Message  Patient Details  Name: [name] MRN: [id number] Date of Birth: [birth year]   Medicare Important Message Given:  Yes    Kerin Salen 12/31/2018, 1:31 PMImportant Message  Patient Details  Name: [name] MRN: [id number] Date of Birth: [birth year]   Medicare Important Message Given:  Yes    Kerin Salen 12/31/2018, 1:31 PM

## 2018-12-31 NOTE — Discharge Summary (Signed)
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 [place] [address]    Special Requests   Final    BOTTLES DRAWN AEROBIC AND ANAEROBIC Blood Culture adequate volume Performed at [facility] [address] [place], [place] [address]    Culture  Setup Time   Final    GRAM POSITIVE COCCI IN CLUSTERS AEROBIC BOTTLE ONLY CRITICAL RESULT CALLED TO, READ BACK BY AND VERIFIED WITH: Marcy Siren PharmD 9:50 12/27/18 ([email or web address])    Culture (A)  Final    STAPHYLOCOCCUS CAPITIS STAPHYLOCOCCUS EPIDERMIDIS THE SIGNIFICANCE OF ISOLATING THIS ORGANISM FROM A SINGLE SET OF BLOOD CULTURES WHEN MULTIPLE SETS ARE DRAWN IS UNCERTAIN. PLEASE NOTIFY THE MICROBIOLOGY DEPARTMENT WITHIN ONE WEEK IF SPECIATION AND SENSITIVITIES ARE  REQUIRED. Performed at [name], [place] [address], [place], [place] [address]    Report Status 12/30/2018 FINAL  Final  Blood Culture ID Panel (Reflexed)     Status: Abnormal   Collection Time: 12/26/18  8:05 AM  Result Value Ref Range Status   Enterococcus species NOT DETECTED NOT DETECTED Final   Listeria monocytogenes NOT DETECTED NOT DETECTED Final   Staphylococcus species DETECTED (A) NOT DETECTED Final    Comment: Methicillin (oxacillin) resistant coagulase negative staphylococcus. Possible blood culture contaminant (unless isolated from more than one blood culture draw or clinical case suggests pathogenicity). No antibiotic treatment is indicated for blood  culture contaminants. CRITICAL RESULT CALLED TO, READ BACK BY AND VERIFIED WITH: Marcy Siren PharmD 9:50 12/27/18 ([email or web address])    Staphylococcus aureus (BCID) NOT DETECTED NOT DETECTED Final   Methicillin resistance DETECTED (A) NOT DETECTED Final    Comment: CRITICAL RESULT CALLED TO, READ BACK BY AND VERIFIED WITH: Marcy Siren PharmD 9:50 12/27/18 ([email or web address])    Streptococcus species NOT DETECTED NOT DETECTED Final   Streptococcus agalactiae NOT DETECTED NOT DETECTED Final   Streptococcus pneumoniae NOT DETECTED NOT DETECTED Final   Streptococcus pyogenes NOT DETECTED NOT DETECTED Final   Acinetobacter baumannii NOT DETECTED NOT DETECTED Final   Enterobacteriaceae species NOT DETECTED NOT DETECTED Final   Enterobacter cloacae complex NOT DETECTED NOT DETECTED Final   Escherichia coli NOT DETECTED NOT DETECTED Final   Klebsiella oxytoca NOT DETECTED NOT DETECTED Final   Klebsiella pneumoniae NOT DETECTED NOT DETECTED Final   Proteus species NOT DETECTED NOT DETECTED Final   Serratia marcescens NOT DETECTED NOT DETECTED Final   Haemophilus influenzae NOT DETECTED NOT DETECTED Final   Neisseria meningitidis NOT DETECTED NOT DETECTED Final   Pseudomonas aeruginosa NOT DETECTED NOT DETECTED Final   Candida albicans NOT  DETECTED NOT DETECTED Final   Candida glabrata NOT DETECTED NOT DETECTED Final   Candida krusei NOT DETECTED NOT DETECTED Final   Candida parapsilosis NOT DETECTED NOT DETECTED Final   Candida tropicalis NOT DETECTED NOT DETECTED Final    Comment: Performed at [facility], [place]. [address], [place], [place] [address]  Culture, blood (Routine  X 2) w Reflex to ID Panel     Status: None (Preliminary result)   Collection Time: 12/28/18 10:39 AM  Result Value Ref Range Status   Specimen Description   Final    BLOOD RIGHT HAND Performed at [facility] [address] [place], [place] [address]    Special Requests   Final    BOTTLES DRAWN AEROBIC ONLY Blood Culture results may not be optimal due to an inadequate volume of blood received in culture bottles Performed at [facility] [address], [place], [place] [address]    Culture   Final    NO GROWTH 3 DAYS Performed at [facility], [place] [address] [place], [place] [address]    Report Status PENDING  Incomplete  Culture, blood (Routine X 2) w Reflex to ID Panel     Status: None (Preliminary result)   Collection Time: 12/28/18 10:46 AM  Result Value Ref Range Status   Specimen Description   Final    BLOOD LEFT HAND Performed at [facility] [address] [place], [place] [address]    Special Requests   Final    BOTTLES DRAWN AEROBIC ONLY Blood Culture adequate volume Performed at [facility] [address], [place], [place] [address]    Culture   Final    NO GROWTH 3 DAYS Performed at [facility], [place] [address] [place], [place] [address]    Report Status PENDING  Incomplete       Today   Subjective:   Shiro Ellerman today has no headache,no chest or abdominal pain, ports she is feeling much better today, dyspnea at baseline o Objective:   Blood pressure (!) 149/88, pulse 70, temperature (!) 97.5 F (36.4 C),  temperature source Oral, resp. rate 16, height 6\' 3"  (1.905 m), weight 129.2 kg, SpO2 96 %.   Intake/Output Summary (Last 24 hours) at 12/31/2018 1155 Last data filed at 12/31/2018 1027 Gross per 24 hour  Intake 850 ml  Output 6950 ml  Net -6100 ml    Exam Awake Alert, Oriented x 3, No new F.N deficits, Normal affect Symmetrical Chest wall movement, Good air movement bilaterally, minimal end expiratory wheeze RRR,No Gallops,Rubs or new Murmurs, No Parasternal Heave +ve B.Sounds, Abd Soft, Non tender, No organomegaly appriciated, No rebound -guarding or rigidity. No Cyanosis, Clubbing, trace edema edema, No new Rash or bruise  Data Review   CBC w Diff:  Lab Results  Component Value Date   WBC 7.3 12/30/2018   HGB 9.7 (L) 12/30/2018   HCT 33.2 (L) 12/30/2018   PLT 187 12/30/2018   LYMPHOPCT 15 12/26/2018   BANDSPCT 10 06/28/2017   MONOPCT 8 12/26/2018   EOSPCT 2 12/26/2018   BASOPCT 1 12/26/2018    CMP:  Lab Results  Component Value Date   NA 135 12/30/2018   K 3.8 12/30/2018   CL 92 (L) 12/30/2018   CO2 30 12/30/2018   BUN 35 (H) 12/30/2018   CREATININE 1.21 12/30/2018   CREATININE 1.09 02/13/2015   PROT 7.0 12/27/2018   ALBUMIN 3.6 12/27/2018   BILITOT 0.6 12/27/2018   ALKPHOS 118 12/27/2018   AST 18 12/27/2018   ALT 13 12/27/2018  .   Total Time in preparing paper work, data evaluation and todays exam - 16 minutes  Phillips Climes M.D on 12/31/2018 at 11:55 AM  [facility]   Office  [phone]

## 2018-12-31 NOTE — Progress Notes (Addendum)
Physical Therapy Treatment Patient Details Name: [name] MRN: [id number] DOB: 09-27-1941 Today's Date: 12/31/2018    History of Present Illness 78 yo male admitted with hypoxia, possible pna vs CHF. Hx of COPD-O2 dep, CHF, CAD, AAA, bipolar d/o, CABG    PT Comments    Pt feeling better.  Was able to self rise OOB and amb a functional distance.  General Gait Details: good alternating gait with a functional distance remained on 4 lts with sats 93% and HR 98.  mild dyspnea.  Tolerated distance well.  Pt plans to D/C to home with spouse.  Follow Up Recommendations   NONE  Pt refusing SNF and HH Pt appears to be at prior level   Equipment Recommendations  None recommended by PT    Recommendations for Other Services       Precautions / Restrictions Precautions Precautions: Fall Precaution Comments: Home O2 @ 4 lts Restrictions Weight Bearing Restrictions: No    Mobility  Bed Mobility Overal bed mobility: Modified Independent       Supine to sit: Modified independent (Device/Increase time)     General bed mobility comments: able self with increased time and use of rail  Transfers Overall transfer level: Needs assistance Equipment used: None Transfers: Sit to/from Stand           General transfer comment: good use of hands to steady self and good safety cognition   Ambulation/Gait Ambulation/Gait assistance: Supervision Gait Distance (Feet): 115 Feet Assistive device: Rolling walker (2 wheeled) Gait Pattern/deviations: Step-through pattern Gait velocity: decreased    General Gait Details: good alternating gait with a functional distance remained on 4 lts with sats 93% and HR 98.  mild dyspnea.  Tolerated distance well.    Stairs             Wheelchair Mobility    Modified Rankin (Stroke Patients Only)       Balance                                            Cognition Arousal/Alertness: Awake/alert Behavior During  Therapy: WFL for tasks assessed/performed Overall Cognitive Status: Within Functional Limits for tasks assessed                                 General Comments: slightly inapropriate but cooperative       Exercises      General Comments        Pertinent Vitals/Pain      Home Living                      Prior Function            PT Goals (current goals can now be found in the care plan section) Progress towards PT goals: Progressing toward goals    Frequency    Min 2X/week      PT Plan Current plan remains appropriate    Co-evaluation              AM-PAC PT "6 Clicks" Mobility   Outcome Measure  Help needed turning from your back to your side while in a flat bed without using bedrails?: A Little Help needed moving from lying on your back to sitting on the side of a flat bed without using  bedrails?: A Little Help needed moving to and from a bed to a chair (including a wheelchair)?: A Little Help needed standing up from a chair using your arms (e.g., wheelchair or bedside chair)?: A Little Help needed to walk in hospital room?: A Little Help needed climbing 3-5 steps with a railing? : A Little 6 Click Score: 18    End of Session Equipment Utilized During Treatment: Oxygen Activity Tolerance: Patient tolerated treatment well Patient left: in chair;with chair alarm set;with call bell/phone within reach Nurse Communication: Mobility status(RN observered ) PT Visit Diagnosis: Muscle weakness (generalized) (M62.81);Difficulty in walking, not elsewhere classified (R26.2)     Time: 1040-1105 PT Time Calculation (min) (ACUTE ONLY): 25 min  Charges:  $Gait Training: 8-22 mins $Therapeutic Activity: 8-22 mins                     Rica Koyanagi  PTA Acute  Rehabilitation Services Pager      [phone] Office      [phone]

## 2019-01-02 LAB — CULTURE, BLOOD (ROUTINE X 2)
Culture: NO GROWTH
Culture: NO GROWTH
Special Requests: ADEQUATE

## 2019-01-05 LAB — CULTURE, BLOOD (ROUTINE X 2)
Culture: NO GROWTH
Culture: NO GROWTH
SPECIAL REQUESTS: ADEQUATE
Special Requests: ADEQUATE

## 2019-02-14 ENCOUNTER — Other Ambulatory Visit: Payer: Self-pay

## 2019-02-28 ENCOUNTER — Encounter (HOSPITAL_COMMUNITY): Payer: Self-pay

## 2019-02-28 ENCOUNTER — Other Ambulatory Visit: Payer: Self-pay

## 2019-02-28 ENCOUNTER — Emergency Department (HOSPITAL_COMMUNITY): Payer: Medicare HMO

## 2019-02-28 ENCOUNTER — Inpatient Hospital Stay (HOSPITAL_COMMUNITY)
Admission: EM | Admit: 2019-02-28 | Discharge: 2019-03-05 | DRG: 190 | Disposition: A | Discharge status: Home / Self Care | Payer: Medicare HMO | Attending: Internal Medicine | Admitting: Internal Medicine

## 2019-02-28 DIAGNOSIS — J9621 Acute and chronic respiratory failure with hypoxia: Secondary | ICD-10-CM | POA: Diagnosis not present

## 2019-02-28 DIAGNOSIS — I714 Abdominal aortic aneurysm, without rupture, unspecified: Secondary | ICD-10-CM | POA: Diagnosis present

## 2019-02-28 DIAGNOSIS — Z7982 Long term (current) use of aspirin: Secondary | ICD-10-CM

## 2019-02-28 DIAGNOSIS — Z6832 Body mass index (BMI) 32.0-32.9, adult: Secondary | ICD-10-CM

## 2019-02-28 DIAGNOSIS — Z9981 Dependence on supplemental oxygen: Secondary | ICD-10-CM | POA: Diagnosis not present

## 2019-02-28 DIAGNOSIS — J309 Allergic rhinitis, unspecified: Secondary | ICD-10-CM | POA: Diagnosis present

## 2019-02-28 DIAGNOSIS — Z794 Long term (current) use of insulin: Secondary | ICD-10-CM | POA: Diagnosis not present

## 2019-02-28 DIAGNOSIS — Z888 Allergy status to other drugs, medicaments and biological substances status: Secondary | ICD-10-CM

## 2019-02-28 DIAGNOSIS — Z1159 Encounter for screening for other viral diseases: Secondary | ICD-10-CM | POA: Diagnosis not present

## 2019-02-28 DIAGNOSIS — D500 Iron deficiency anemia secondary to blood loss (chronic): Secondary | ICD-10-CM | POA: Diagnosis not present

## 2019-02-28 DIAGNOSIS — I5032 Chronic diastolic (congestive) heart failure: Secondary | ICD-10-CM | POA: Diagnosis not present

## 2019-02-28 DIAGNOSIS — Z79899 Other long term (current) drug therapy: Secondary | ICD-10-CM

## 2019-02-28 DIAGNOSIS — Z881 Allergy status to other antibiotic agents status: Secondary | ICD-10-CM

## 2019-02-28 DIAGNOSIS — E785 Hyperlipidemia, unspecified: Secondary | ICD-10-CM | POA: Diagnosis present

## 2019-02-28 DIAGNOSIS — Z951 Presence of aortocoronary bypass graft: Secondary | ICD-10-CM | POA: Diagnosis not present

## 2019-02-28 DIAGNOSIS — I10 Essential (primary) hypertension: Secondary | ICD-10-CM | POA: Diagnosis present

## 2019-02-28 DIAGNOSIS — Z87891 Personal history of nicotine dependence: Secondary | ICD-10-CM

## 2019-02-28 DIAGNOSIS — F419 Anxiety disorder, unspecified: Secondary | ICD-10-CM | POA: Diagnosis present

## 2019-02-28 DIAGNOSIS — K5520 Angiodysplasia of colon without hemorrhage: Secondary | ICD-10-CM

## 2019-02-28 DIAGNOSIS — K449 Diaphragmatic hernia without obstruction or gangrene: Secondary | ICD-10-CM | POA: Diagnosis present

## 2019-02-28 DIAGNOSIS — I252 Old myocardial infarction: Secondary | ICD-10-CM

## 2019-02-28 DIAGNOSIS — Z7951 Long term (current) use of inhaled steroids: Secondary | ICD-10-CM | POA: Diagnosis not present

## 2019-02-28 DIAGNOSIS — F332 Major depressive disorder, recurrent severe without psychotic features: Secondary | ICD-10-CM | POA: Diagnosis not present

## 2019-02-28 DIAGNOSIS — E1140 Type 2 diabetes mellitus with diabetic neuropathy, unspecified: Secondary | ICD-10-CM | POA: Diagnosis present

## 2019-02-28 DIAGNOSIS — E118 Type 2 diabetes mellitus with unspecified complications: Secondary | ICD-10-CM | POA: Diagnosis not present

## 2019-02-28 DIAGNOSIS — Z885 Allergy status to narcotic agent status: Secondary | ICD-10-CM

## 2019-02-28 DIAGNOSIS — E119 Type 2 diabetes mellitus without complications: Secondary | ICD-10-CM | POA: Diagnosis present

## 2019-02-28 DIAGNOSIS — Z8679 Personal history of other diseases of the circulatory system: Secondary | ICD-10-CM

## 2019-02-28 DIAGNOSIS — G894 Chronic pain syndrome: Secondary | ICD-10-CM | POA: Diagnosis not present

## 2019-02-28 DIAGNOSIS — J441 Chronic obstructive pulmonary disease with (acute) exacerbation: Principal | ICD-10-CM | POA: Diagnosis present

## 2019-02-28 DIAGNOSIS — I2510 Atherosclerotic heart disease of native coronary artery without angina pectoris: Secondary | ICD-10-CM | POA: Diagnosis not present

## 2019-02-28 DIAGNOSIS — Z880 Allergy status to penicillin: Secondary | ICD-10-CM

## 2019-02-28 DIAGNOSIS — Z8249 Family history of ischemic heart disease and other diseases of the circulatory system: Secondary | ICD-10-CM

## 2019-02-28 DIAGNOSIS — Z825 Family history of asthma and other chronic lower respiratory diseases: Secondary | ICD-10-CM

## 2019-02-28 DIAGNOSIS — I110 Hypertensive heart disease with heart failure: Secondary | ICD-10-CM | POA: Diagnosis present

## 2019-02-28 DIAGNOSIS — F3162 Bipolar disorder, current episode mixed, moderate: Secondary | ICD-10-CM | POA: Diagnosis present

## 2019-02-28 DIAGNOSIS — K219 Gastro-esophageal reflux disease without esophagitis: Secondary | ICD-10-CM | POA: Diagnosis present

## 2019-02-28 DIAGNOSIS — Z833 Family history of diabetes mellitus: Secondary | ICD-10-CM

## 2019-02-28 LAB — CBC WITH DIFFERENTIAL/PLATELET
Abs Immature Granulocytes: 0.36 10*3/uL — ABNORMAL HIGH (ref 0.00–0.07)
Basophils Absolute: 0.1 10*3/uL (ref 0.0–0.1)
Basophils Relative: 1 %
Eosinophils Absolute: 0.3 10*3/uL (ref 0.0–0.5)
Eosinophils Relative: 4 %
HCT: 32.3 % — ABNORMAL LOW (ref 39.0–52.0)
Hemoglobin: 9.4 g/dL — ABNORMAL LOW (ref 13.0–17.0)
Immature Granulocytes: 4 %
Lymphocytes Relative: 11 %
Lymphs Abs: 1 10*3/uL (ref 0.7–4.0)
MCH: 28.7 pg (ref 26.0–34.0)
MCHC: 29.1 g/dL — ABNORMAL LOW (ref 30.0–36.0)
MCV: 98.5 fL (ref 80.0–100.0)
Monocytes Absolute: 0.7 10*3/uL (ref 0.1–1.0)
Monocytes Relative: 8 %
Neutro Abs: 6 10*3/uL (ref 1.7–7.7)
Neutrophils Relative %: 72 %
Platelets: 186 10*3/uL (ref 150–400)
RBC: 3.28 MIL/uL — ABNORMAL LOW (ref 4.22–5.81)
RDW: 14.9 % (ref 11.5–15.5)
WBC: 8.3 10*3/uL (ref 4.0–10.5)
nRBC: 0 % (ref 0.0–0.2)

## 2019-02-28 LAB — COMPREHENSIVE METABOLIC PANEL
ALT: 15 U/L (ref 0–44)
AST: 14 U/L — ABNORMAL LOW (ref 15–41)
Albumin: 3.1 g/dL — ABNORMAL LOW (ref 3.5–5.0)
Alkaline Phosphatase: 122 U/L (ref 38–126)
Anion gap: 5 (ref 5–15)
BUN: 22 mg/dL (ref 8–23)
CO2: 35 mmol/L — ABNORMAL HIGH (ref 22–32)
Calcium: 8.1 mg/dL — ABNORMAL LOW (ref 8.9–10.3)
Chloride: 96 mmol/L — ABNORMAL LOW (ref 98–111)
Creatinine, Ser: 1.09 mg/dL (ref 0.61–1.24)
GFR calc Af Amer: >60 mL/min (ref >60)
GFR calc non Af Amer: >60 mL/min (ref >60)
Glucose, Bld: 301 mg/dL — ABNORMAL HIGH (ref 70–99)
Potassium: 4.9 mmol/L (ref 3.5–5.1)
Sodium: 136 mmol/L (ref 135–145)
Total Bilirubin: 0.3 mg/dL (ref 0.3–1.2)
Total Protein: 6.3 g/dL — ABNORMAL LOW (ref 6.5–8.1)

## 2019-02-28 LAB — URINALYSIS, ROUTINE W REFLEX MICROSCOPIC
Bilirubin Urine: NEGATIVE
Glucose, UA: >=500 mg/dL — AB
Hgb urine dipstick: NEGATIVE
Ketones, ur: NEGATIVE mg/dL
Nitrite: NEGATIVE
Protein, ur: NEGATIVE mg/dL
Specific Gravity, Urine: 1.014 (ref 1.005–1.030)
WBC, UA: >50 WBC/hpf — ABNORMAL HIGH (ref 0–5)
pH: 6 (ref 5.0–8.0)

## 2019-02-28 LAB — LIPID PANEL
Cholesterol: 214 mg/dL — ABNORMAL HIGH (ref 0–200)
HDL: 36 mg/dL — ABNORMAL LOW (ref >40)
LDL Cholesterol: 122 mg/dL — ABNORMAL HIGH (ref 0–99)
Total CHOL/HDL Ratio: 5.9 RATIO
Triglycerides: 278 mg/dL — ABNORMAL HIGH (ref <150)
VLDL: 56 mg/dL — ABNORMAL HIGH (ref 0–40)

## 2019-02-28 LAB — PROCALCITONIN: Procalcitonin: 0.15 ng/mL

## 2019-02-28 LAB — GLUCOSE, CAPILLARY
Glucose-Capillary: 312 mg/dL — ABNORMAL HIGH (ref 70–99)
Glucose-Capillary: 519 mg/dL (ref 70–99)
Glucose-Capillary: 477 mg/dL — ABNORMAL HIGH (ref 70–99)

## 2019-02-28 LAB — MAGNESIUM: Magnesium: 2.2 mg/dL (ref 1.7–2.4)

## 2019-02-28 LAB — LACTIC ACID, PLASMA
Lactic Acid, Venous: 1.3 mmol/L (ref 0.5–1.9)
Lactic Acid, Venous: 2.1 mmol/L (ref 0.5–1.9)

## 2019-02-28 LAB — PHOSPHORUS: Phosphorus: 1.4 mg/dL — ABNORMAL LOW (ref 2.5–4.6)

## 2019-02-28 LAB — HEMOGLOBIN A1C
Hgb A1c MFr Bld: 9 % — ABNORMAL HIGH (ref 4.8–5.6)
Mean Plasma Glucose: 211.6 mg/dL

## 2019-02-28 LAB — BRAIN NATRIURETIC PEPTIDE: B Natriuretic Peptide: 112 pg/mL — ABNORMAL HIGH (ref 0.0–100.0)

## 2019-02-28 LAB — GLUCOSE, RANDOM: Glucose, Bld: 549 mg/dL (ref 70–99)

## 2019-02-28 LAB — SARS CORONAVIRUS 2 BY RT PCR (HOSPITAL ORDER, PERFORMED IN ~~LOC~~ HOSPITAL LAB): SARS Coronavirus 2: NEGATIVE

## 2019-02-28 LAB — TROPONIN I: Troponin I: <0.03 ng/mL (ref <0.03)

## 2019-02-28 LAB — TSH: TSH: 2.528 u[IU]/mL (ref 0.350–4.500)

## 2019-02-28 MED ORDER — INSULIN GLARGINE 100 UNIT/ML ~~LOC~~ SOLN
25.0000 [IU] | Freq: Two times a day (BID) | SUBCUTANEOUS | Status: DC
  Administered 2019-02-28: 22:00:00 25 [IU] via SUBCUTANEOUS
  Filled 2019-02-28 (×2): qty 0.25

## 2019-02-28 MED ORDER — SODIUM CHLORIDE 0.9 % IV SOLN: INTRAVENOUS | Status: DC

## 2019-02-28 MED ORDER — FERROUS SULFATE 325 (65 FE) MG PO TABS
325.0000 mg | ORAL_TABLET | Freq: Three times a day (TID) | ORAL | Status: DC
  Administered 2019-02-28 – 2019-03-05 (×15): 325 mg via ORAL
  Filled 2019-02-28 (×15): qty 1

## 2019-02-28 MED ORDER — SORBITOL 70 % SOLN
30.0000 mL | Freq: Every day | Status: DC | PRN
  Filled 2019-02-28: qty 30

## 2019-02-28 MED ORDER — ALBUTEROL SULFATE HFA 108 (90 BASE) MCG/ACT IN AERS
6.0000 | INHALATION_SPRAY | RESPIRATORY_TRACT | Status: DC | PRN
  Administered 2019-02-28: 07:00:00 6 via RESPIRATORY_TRACT
  Filled 2019-02-28: qty 6.7

## 2019-02-28 MED ORDER — KETOROLAC TROMETHAMINE 15 MG/ML IJ SOLN: 15.0000 mg | Freq: Four times a day (QID) | INTRAMUSCULAR | Status: AC | PRN

## 2019-02-28 MED ORDER — DOCUSATE SODIUM 100 MG PO CAPS
100.0000 mg | ORAL_CAPSULE | Freq: Two times a day (BID) | ORAL | Status: DC
  Administered 2019-02-28 – 2019-03-05 (×10): 100 mg via ORAL
  Filled 2019-02-28 (×10): qty 1

## 2019-02-28 MED ORDER — PANTOPRAZOLE SODIUM 40 MG PO TBEC
40.0000 mg | DELAYED_RELEASE_TABLET | Freq: Two times a day (BID) | ORAL | Status: DC
  Administered 2019-02-28 – 2019-03-05 (×11): 40 mg via ORAL
  Filled 2019-02-28 (×11): qty 1

## 2019-02-28 MED ORDER — METHYLPREDNISOLONE SODIUM SUCC 125 MG IJ SOLR
60.0000 mg | Freq: Four times a day (QID) | INTRAMUSCULAR | Status: DC
  Administered 2019-02-28 – 2019-03-01 (×4): 60 mg via INTRAVENOUS
  Filled 2019-02-28 (×4): qty 2

## 2019-02-28 MED ORDER — ONDANSETRON HCL 4 MG/2ML IJ SOLN: 4.0000 mg | Freq: Four times a day (QID) | INTRAMUSCULAR | Status: DC | PRN

## 2019-02-28 MED ORDER — INSULIN ASPART 100 UNIT/ML ~~LOC~~ SOLN
18.0000 [IU] | Freq: Once | SUBCUTANEOUS | Status: AC
  Administered 2019-02-28: 17:00:00 18 [IU] via SUBCUTANEOUS

## 2019-02-28 MED ORDER — ALBUTEROL SULFATE (2.5 MG/3ML) 0.083% IN NEBU: 2.5000 mg | INHALATION_SOLUTION | RESPIRATORY_TRACT | Status: DC | PRN

## 2019-02-28 MED ORDER — TRAZODONE HCL 50 MG PO TABS
50.0000 mg | ORAL_TABLET | Freq: Every evening | ORAL | Status: DC | PRN
  Administered 2019-03-04: 22:00:00 50 mg via ORAL
  Filled 2019-02-28: qty 1

## 2019-02-28 MED ORDER — FUROSEMIDE 40 MG PO TABS
60.0000 mg | ORAL_TABLET | Freq: Every day | ORAL | Status: DC
  Administered 2019-02-28 – 2019-03-05 (×6): 60 mg via ORAL
  Filled 2019-02-28 (×6): qty 2

## 2019-02-28 MED ORDER — CITALOPRAM HYDROBROMIDE 20 MG PO TABS
40.0000 mg | ORAL_TABLET | Freq: Every day | ORAL | Status: DC
  Administered 2019-03-01: 40 mg via ORAL
  Filled 2019-02-28 (×2): qty 2

## 2019-02-28 MED ORDER — IPRATROPIUM-ALBUTEROL 20-100 MCG/ACT IN AERS
2.0000 | INHALATION_SPRAY | Freq: Four times a day (QID) | RESPIRATORY_TRACT | Status: DC
  Filled 2019-02-28: qty 4

## 2019-02-28 MED ORDER — HEPARIN SODIUM (PORCINE) 5000 UNIT/ML IJ SOLN
5000.0000 [IU] | Freq: Three times a day (TID) | INTRAMUSCULAR | Status: DC
  Administered 2019-02-28 – 2019-03-05 (×14): 5000 [IU] via SUBCUTANEOUS
  Filled 2019-02-28 (×15): qty 1

## 2019-02-28 MED ORDER — INSULIN ASPART 100 UNIT/ML ~~LOC~~ SOLN
0.0000 [IU] | Freq: Three times a day (TID) | SUBCUTANEOUS | Status: DC
  Administered 2019-02-28: 12:00:00 7 [IU] via SUBCUTANEOUS

## 2019-02-28 MED ORDER — LISINOPRIL 5 MG PO TABS
2.5000 mg | ORAL_TABLET | Freq: Every day | ORAL | Status: DC
  Administered 2019-02-28 – 2019-03-04 (×5): 2.5 mg via ORAL
  Administered 2019-03-05: 09:00:00 via ORAL
  Filled 2019-02-28 (×6): qty 1

## 2019-02-28 MED ORDER — GUAIFENESIN-DM 100-10 MG/5ML PO SYRP
5.0000 mL | ORAL_SOLUTION | Freq: Four times a day (QID) | ORAL | Status: DC
  Administered 2019-03-01 – 2019-03-05 (×9): 5 mL via ORAL
  Filled 2019-02-28 (×12): qty 10

## 2019-02-28 MED ORDER — LEVALBUTEROL HCL 0.63 MG/3ML IN NEBU: 0.6300 mg | INHALATION_SOLUTION | Freq: Four times a day (QID) | RESPIRATORY_TRACT | Status: DC | PRN

## 2019-02-28 MED ORDER — ACETAMINOPHEN 650 MG RE SUPP: 650.0000 mg | Freq: Four times a day (QID) | RECTAL | Status: DC | PRN

## 2019-02-28 MED ORDER — ALPRAZOLAM 0.25 MG PO TABS
0.2500 mg | ORAL_TABLET | Freq: Three times a day (TID) | ORAL | Status: DC | PRN
  Administered 2019-02-28: 22:00:00 0.25 mg via ORAL
  Filled 2019-02-28: qty 1

## 2019-02-28 MED ORDER — MAGNESIUM CITRATE PO SOLN: 1.0000 | Freq: Once | ORAL | Status: DC | PRN

## 2019-02-28 MED ORDER — METHYLPREDNISOLONE SODIUM SUCC 125 MG IJ SOLR
125.0000 mg | Freq: Once | INTRAMUSCULAR | Status: AC
  Administered 2019-02-28: 08:00:00 125 mg via INTRAVENOUS
  Filled 2019-02-28: qty 2

## 2019-02-28 MED ORDER — SENNOSIDES-DOCUSATE SODIUM 8.6-50 MG PO TABS: 1.0000 | ORAL_TABLET | Freq: Every evening | ORAL | Status: DC | PRN

## 2019-02-28 MED ORDER — CITALOPRAM HYDROBROMIDE 20 MG PO TABS: 20.0000 mg | ORAL_TABLET | Freq: Every day | ORAL | Status: DC

## 2019-02-28 MED ORDER — INSULIN GLARGINE 100 UNIT/ML ~~LOC~~ SOLN: 55.0000 [IU] | Freq: Two times a day (BID) | SUBCUTANEOUS | Status: DC

## 2019-02-28 MED ORDER — ATORVASTATIN CALCIUM 10 MG PO TABS
10.0000 mg | ORAL_TABLET | Freq: Every day | ORAL | Status: DC
  Administered 2019-02-28 – 2019-03-04 (×5): 10 mg via ORAL
  Filled 2019-02-28 (×5): qty 1

## 2019-02-28 MED ORDER — DILTIAZEM HCL ER COATED BEADS 240 MG PO CP24: 240.0000 mg | ORAL_CAPSULE | Freq: Every day | ORAL | Status: DC

## 2019-02-28 MED ORDER — METOPROLOL TARTRATE 12.5 MG HALF TABLET
12.5000 mg | ORAL_TABLET | Freq: Two times a day (BID) | ORAL | Status: DC
  Administered 2019-02-28 – 2019-03-05 (×11): 12.5 mg via ORAL
  Filled 2019-02-28 (×11): qty 1

## 2019-02-28 MED ORDER — ACETAMINOPHEN 325 MG PO TABS
650.0000 mg | ORAL_TABLET | Freq: Four times a day (QID) | ORAL | Status: DC | PRN
  Administered 2019-03-01 – 2019-03-04 (×4): 650 mg via ORAL
  Filled 2019-02-28 (×4): qty 2

## 2019-02-28 MED ORDER — LEVALBUTEROL HCL 0.63 MG/3ML IN NEBU
0.6300 mg | INHALATION_SOLUTION | Freq: Four times a day (QID) | RESPIRATORY_TRACT | Status: DC
  Administered 2019-02-28 – 2019-03-05 (×21): 0.63 mg via RESPIRATORY_TRACT
  Filled 2019-02-28 (×21): qty 3

## 2019-02-28 MED ORDER — AZITHROMYCIN 250 MG PO TABS
500.0000 mg | ORAL_TABLET | Freq: Every day | ORAL | Status: AC
  Administered 2019-02-28 – 2019-03-04 (×5): 500 mg via ORAL
  Filled 2019-02-28 (×5): qty 2

## 2019-02-28 MED ORDER — ONDANSETRON HCL 4 MG PO TABS
4.0000 mg | ORAL_TABLET | Freq: Four times a day (QID) | ORAL | Status: DC | PRN
  Administered 2019-03-01: 13:00:00 4 mg via ORAL
  Filled 2019-02-28: qty 1

## 2019-02-28 MED ORDER — SODIUM CHLORIDE 0.9 % IV SOLN
100.0000 mg | Freq: Once | INTRAVENOUS | Status: AC
  Administered 2019-02-28: 100 mg via INTRAVENOUS
  Filled 2019-02-28: qty 100

## 2019-02-28 MED ORDER — ASPIRIN EC 81 MG PO TBEC
81.0000 mg | DELAYED_RELEASE_TABLET | Freq: Every day | ORAL | Status: DC
  Administered 2019-02-28 – 2019-03-02 (×3): 81 mg via ORAL
  Filled 2019-02-28 (×3): qty 1

## 2019-02-28 NOTE — ED Notes (Signed)
Patient has called out multiple times, urinated on the floor instead of the urinal, and insinuated that the RN's unplugged his call bell when it was on the patient's stomach.  RN and Evelena Peat, Extern re-checked the call bell and re-checked patient's IV site. No swelling noted to the area.  Pt is resting with a urinal at the bedside and call bell within reach on his abdomen.

## 2019-02-28 NOTE — ED Notes (Signed)
Attempted to stick x2 for blood, charge RN tried x2, unsuccessful. IV team consult placed.

## 2019-02-28 NOTE — ED Triage Notes (Signed)
Per EMS, patient coming from home with complaints of shortness of breath and cough for the past couple of months. Denies fever. Patient states that he has been seen several times but nothing seems to fix his problem.   Patient wears 3L O2 at home due to COPD. SpO2 was 93% on arrival and EMS placed him on 15 L via .

## 2019-02-28 NOTE — ED Provider Notes (Addendum)
I received the patient in signout from Dr. Jake Bathe, briefly the patient is a 78 year old male with a chief complaint of shortness of breath.  Patient has a history of COPD and he feels that his shortness of breath is worsened over the past 5 years.  Worsening over the past couple days having increased cough increase sputum production.  No fevers or chills.  Was recently in the hospital for pneumonia he thinks maybe he was released too early.  On 3 L of oxygen at all times at home.  Initially was placed on nonrebreather due to hypoxia.  As the patient sitting in bed his oxygen saturation was in the 90s on his home oxygen.  The plan is for admission to the hospital status post lab work.  Labwork returned, negative trop, hbg at baseline.  No significant change to renal function.  Will discuss with hospitalist.   Procedure note: Ultrasound Guided Peripheral IV Ultrasound guided peripheral 1.88 inch angiocath IV placement performed by me. Indications: Nursing unable to place IV. Details: The antecubital fossa and upper arm were evaluated with a multifrequency linear probe. Patent brachial veins were noted. 1 attempt was made to cannulate a vein under realtime US guidance with successful cannulation of the vein and catheter placement. There is return of non-pulsatile dark red blood. The patient tolerated the procedure well without complications. Images archived electronically.  CPT codes: [phone] and [phone]   The patients results and plan were reviewed and discussed.   Any x-rays performed were independently reviewed by myself.   Differential diagnosis were considered with the presenting HPI.  Medications  albuterol (VENTOLIN HFA) 108 (90 Base) MCG/ACT inhaler 6 puff (6 puffs Inhalation Given 02/28/19 0630)  doxycycline (VIBRAMYCIN) 100 mg in sodium chloride 0.9 % 250 mL IVPB (100 mg Intravenous New Bag/Given 02/28/19 0849)  Ipratropium-Albuterol (COMBIVENT) respimat 2 puff (has no administration in time range)   methylPREDNISolone sodium succinate (SOLU-MEDROL) 125 mg/2 mL injection 125 mg (125 mg Intravenous Given 02/28/19 0748)    Vitals:   02/28/19 0502 02/28/19 0627 02/28/19 0848  BP: (!) 142/75 (!) 142/82 (!) 142/74  Pulse: (!) 105 (!) 104 (!) 102  Resp: 18 18 19   Temp: 99.6 F (37.6 C)    TempSrc: Oral    SpO2: 100% 95% 94%  Weight: 118.8 kg    Height: 6\' 3"  (1.905 m)      Final diagnoses:  COPD exacerbation ([place])    Admission/ observation were discussed with the admitting physician, patient and/or family and they are comfortable with the plan.     Deno Etienne, DO 02/28/19 [place], [place], DO 02/28/19 [phone]

## 2019-02-28 NOTE — Progress Notes (Signed)
CRITICAL VALUE ALERT  Critical Value: Lactic acid 2.1 Date & Time Notied:  02/28/2019 @ 1330  Provider Notified: Dr. Erling Conte  Orders Received/Actions taken: no new orders at this time

## 2019-02-28 NOTE — Evaluation (Signed)
Physical Therapy Evaluation Patient Details Name: [name] MRN: [id number] DOB: 1941-08-06 Today's Date: 02/28/2019   History of Present Illness  [name] is a 78 y.o. male adm with acute on chronic respiratory failure. medical history significant of chronic respiratory failure, COPD, O2 dependent ? 7 L at home, AAA (S/p endovascular repair), AVMs, anxiety, bipolar1, MI, CAD, CABG, dCHF, pression/anxiety, DM II,HTN, GERD, HLD, chronic microcytic anemia.   Clinical Impression  Pt admitted with above diagnosis. Pt currently with functional limitations due to the deficits listed below (see PT Problem List).  Pt is uncooperative  With PT, he has a long list of complaints, he has spilled urine in the bed and refuses to have pad changed. He reportedly lives a sedentary lifestyle but does walk short household distances, will follow for a trial of PT   Pt will benefit from skilled PT to increase their independence and safety with mobility to allow discharge to the venue listed below.       Follow Up Recommendations Home health PT;Supervision for mobility/OOB    Equipment Recommendations  None recommended by PT    Recommendations for Other Services       Precautions / Restrictions Precautions Precautions: Fall;Other (comment) Precaution Comments: O2 dependent Restrictions Weight Bearing Restrictions: No      Mobility  Bed Mobility Overal bed mobility: Needs Assistance             General bed mobility comments: pt partially rolls with use of bedrail and then refuses to move anymore  Transfers                 General transfer comment: pt refuses  Ambulation/Gait             General Gait Details: pt refuses  Stairs            Wheelchair Mobility    Modified Rankin (Stroke Patients Only)       Balance                                             Pertinent Vitals/Pain Pain Assessment: No/denies pain    Home Living  Family/patient expects to be discharged to:: Private residence Living Arrangements: Spouse/significant other Available Help at Discharge: Family Type of Home: House Home Access: Stairs to enter     Home Layout: One level Home Equipment: Environmental consultant - 2 wheels;Cane - single point;Bedside commode;Other (comment) Additional Comments: pt reports he lays in bed most of the day, sits up on the EOB to eat     Prior Function Level of Independence: Needs assistance   Gait / Transfers Assistance Needed: walks ~60 ft from bedroom to kitchen with no assistive device. walks to bathroom ~ 15'; pt does not use O2 when he amb to kitchen     Comments: pt reports he leads an extremely  sedentary lifestyle     Hand Dominance        Extremity/Trunk Assessment   Upper Extremity Assessment Upper Extremity Assessment: Generalized weakness    Lower Extremity Assessment Lower Extremity Assessment: (pt moves all four extremities spontaneously against gravity, AROM grossly WFL; he is uncooeprative  with strength testing)       Communication   Communication: HOH  Cognition Arousal/Alertness: Awake/alert Behavior During Therapy: Agitated Overall Cognitive Status: Within Functional Limits for tasks assessed Area of Impairment: Safety/judgement  Safety/Judgement: Decreased awareness of deficits   Problem Solving: Decreased initiation        General Comments      Exercises     Assessment/Plan    PT Assessment Patient needs continued PT services  PT Problem List Decreased strength;Decreased activity tolerance;Decreased mobility;Decreased knowledge of use of DME;Cardiopulmonary status limiting activity       PT Treatment Interventions Functional mobility training;Gait training;Therapeutic activities;Therapeutic exercise;Patient/family education    PT Goals (Current goals can be found in the Care Plan section)  Acute Rehab PT Goals Patient Stated Goal: pt  has no goals PT Goal Formulation: With patient Time For Goal Achievement: 03/14/19 Potential to Achieve Goals: Poor    Frequency Min 2X/week   Barriers to discharge        Co-evaluation               AM-PAC PT "6 Clicks" Mobility  Outcome Measure Help needed turning from your back to your side while in a flat bed without using bedrails?: A Lot Help needed moving from lying on your back to sitting on the side of a flat bed without using bedrails?: A Lot Help needed moving to and from a bed to a chair (including a wheelchair)?: A Lot Help needed standing up from a chair using your arms (e.g., wheelchair or bedside chair)?: A Lot Help needed to walk in hospital room?: A Lot Help needed climbing 3-5 steps with a railing? : A Lot 6 Click Score: 12    End of Session   Activity Tolerance: Patient tolerated treatment well Patient left: with call bell/phone within reach;in bed;with bed alarm set   PT Visit Diagnosis: Muscle weakness (generalized) (M62.81);Difficulty in walking, not elsewhere classified (R26.2)    Time: 4034-7425 PT Time Calculation (min) (ACUTE ONLY): 23 min   Charges:   PT Evaluation $PT Eval Low Complexity: 1 Low PT Treatments $Self Care/Home Management: 8-22        Kenyon Ana, PT  Pager: [phone] Acute Rehab Dept [facility]): [phone]   02/28/2019   [facility] 02/28/2019, 4:19 PM
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
## 2019-02-28 NOTE — ED Notes (Signed)
Pt had accident and linens found soiled. Pt very rude to staff while attempting to clean pt and bed. Pt cleaned and given fresh linens.  Pt able to pivot from bed to chair and from chair to bed with minimal assistance from staff.

## 2019-02-28 NOTE — Progress Notes (Signed)
CRITICAL VALUE ALERT  Critical Value: CBG 519 Date & Time Notied:  02/28/2019@ 1650  Provider Notified: Dr Erling Conte  Orders Received/Actions taken: Insulin orders recieved
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
## 2019-03-01 ENCOUNTER — Inpatient Hospital Stay: Payer: Self-pay

## 2019-03-01 DIAGNOSIS — G894 Chronic pain syndrome: Secondary | ICD-10-CM

## 2019-03-01 DIAGNOSIS — I5032 Chronic diastolic (congestive) heart failure: Secondary | ICD-10-CM

## 2019-03-01 DIAGNOSIS — J441 Chronic obstructive pulmonary disease with (acute) exacerbation: Principal | ICD-10-CM

## 2019-03-01 DIAGNOSIS — E118 Type 2 diabetes mellitus with unspecified complications: Secondary | ICD-10-CM

## 2019-03-01 DIAGNOSIS — E785 Hyperlipidemia, unspecified: Secondary | ICD-10-CM

## 2019-03-01 DIAGNOSIS — I10 Essential (primary) hypertension: Secondary | ICD-10-CM

## 2019-03-01 DIAGNOSIS — I2510 Atherosclerotic heart disease of native coronary artery without angina pectoris: Secondary | ICD-10-CM

## 2019-03-01 DIAGNOSIS — D500 Iron deficiency anemia secondary to blood loss (chronic): Secondary | ICD-10-CM

## 2019-03-01 DIAGNOSIS — F332 Major depressive disorder, recurrent severe without psychotic features: Secondary | ICD-10-CM

## 2019-03-01 DIAGNOSIS — I714 Abdominal aortic aneurysm, without rupture: Secondary | ICD-10-CM

## 2019-03-01 LAB — CBC WITH DIFFERENTIAL/PLATELET
Abs Immature Granulocytes: 0.32 10*3/uL — ABNORMAL HIGH (ref 0.00–0.07)
Basophils Absolute: 0 10*3/uL (ref 0.0–0.1)
Basophils Relative: 0 %
Eosinophils Absolute: 0 10*3/uL (ref 0.0–0.5)
Eosinophils Relative: 0 %
HCT: 30.6 % — ABNORMAL LOW (ref 39.0–52.0)
Hemoglobin: 9.5 g/dL — ABNORMAL LOW (ref 13.0–17.0)
Immature Granulocytes: 3 %
Lymphocytes Relative: 5 %
Lymphs Abs: 0.7 10*3/uL (ref 0.7–4.0)
MCH: 29.3 pg (ref 26.0–34.0)
MCHC: 31 g/dL (ref 30.0–36.0)
MCV: 94.4 fL (ref 80.0–100.0)
Monocytes Absolute: 0.5 10*3/uL (ref 0.1–1.0)
Monocytes Relative: 4 %
Neutro Abs: 10.9 10*3/uL — ABNORMAL HIGH (ref 1.7–7.7)
Neutrophils Relative %: 88 %
Platelets: 244 10*3/uL (ref 150–400)
RBC: 3.24 MIL/uL — ABNORMAL LOW (ref 4.22–5.81)
RDW: 14.5 % (ref 11.5–15.5)
WBC: 12.5 10*3/uL — ABNORMAL HIGH (ref 4.0–10.5)
nRBC: 0 % (ref 0.0–0.2)

## 2019-03-01 LAB — BASIC METABOLIC PANEL
Anion gap: 13 (ref 5–15)
BUN: 41 mg/dL — ABNORMAL HIGH (ref 8–23)
CO2: 30 mmol/L (ref 22–32)
Calcium: 8.4 mg/dL — ABNORMAL LOW (ref 8.9–10.3)
Chloride: 90 mmol/L — ABNORMAL LOW (ref 98–111)
Creatinine, Ser: 1.39 mg/dL — ABNORMAL HIGH (ref 0.61–1.24)
GFR calc Af Amer: 56 mL/min — ABNORMAL LOW (ref >60)
GFR calc non Af Amer: 49 mL/min — ABNORMAL LOW (ref >60)
Glucose, Bld: 383 mg/dL — ABNORMAL HIGH (ref 70–99)
Potassium: 4 mmol/L (ref 3.5–5.1)
Sodium: 133 mmol/L — ABNORMAL LOW (ref 135–145)

## 2019-03-01 LAB — GLUCOSE, CAPILLARY
Glucose-Capillary: 334 mg/dL — ABNORMAL HIGH (ref 70–99)
Glucose-Capillary: 411 mg/dL — ABNORMAL HIGH (ref 70–99)
Glucose-Capillary: 456 mg/dL — ABNORMAL HIGH (ref 70–99)
Glucose-Capillary: 460 mg/dL — ABNORMAL HIGH (ref 70–99)
Glucose-Capillary: 485 mg/dL — ABNORMAL HIGH (ref 70–99)
Glucose-Capillary: 558 mg/dL (ref 70–99)
Glucose-Capillary: 584 mg/dL (ref 70–99)

## 2019-03-01 LAB — PHOSPHORUS: Phosphorus: 1.7 mg/dL — ABNORMAL LOW (ref 2.5–4.6)

## 2019-03-01 MED ORDER — INSULIN ASPART 100 UNIT/ML ~~LOC~~ SOLN
0.0000 [IU] | Freq: Every day | SUBCUTANEOUS | Status: DC
  Administered 2019-03-01: 22:00:00 5 [IU] via SUBCUTANEOUS
  Administered 2019-03-03: 21:00:00 3 [IU] via SUBCUTANEOUS

## 2019-03-01 MED ORDER — ALPRAZOLAM 0.25 MG PO TABS: 0.2500 mg | ORAL_TABLET | Freq: Three times a day (TID) | ORAL | Status: DC | PRN

## 2019-03-01 MED ORDER — DILTIAZEM HCL ER COATED BEADS 240 MG PO CP24
240.0000 mg | ORAL_CAPSULE | Freq: Every day | ORAL | Status: DC
  Administered 2019-03-01: 12:00:00 240 mg via ORAL
  Filled 2019-03-01: qty 1

## 2019-03-01 MED ORDER — INSULIN ASPART 100 UNIT/ML ~~LOC~~ SOLN: 6.0000 [IU] | Freq: Three times a day (TID) | SUBCUTANEOUS | Status: DC

## 2019-03-01 MED ORDER — INSULIN ASPART 100 UNIT/ML ~~LOC~~ SOLN
0.0000 [IU] | SUBCUTANEOUS | Status: DC
  Administered 2019-03-01: 15 [IU] via SUBCUTANEOUS
  Administered 2019-03-01 – 2019-03-02 (×4): 20 [IU] via SUBCUTANEOUS

## 2019-03-01 MED ORDER — GUAIFENESIN ER 600 MG PO TB12
1200.0000 mg | ORAL_TABLET | Freq: Two times a day (BID) | ORAL | Status: DC
  Administered 2019-03-01 – 2019-03-05 (×9): 1200 mg via ORAL
  Filled 2019-03-01 (×9): qty 2

## 2019-03-01 MED ORDER — IPRATROPIUM BROMIDE 0.02 % IN SOLN
0.5000 mg | Freq: Four times a day (QID) | RESPIRATORY_TRACT | Status: DC
  Administered 2019-03-01 – 2019-03-05 (×18): 0.5 mg via RESPIRATORY_TRACT
  Filled 2019-03-01 (×17): qty 2.5

## 2019-03-01 MED ORDER — INSULIN GLARGINE 100 UNIT/ML ~~LOC~~ SOLN
30.0000 [IU] | Freq: Two times a day (BID) | SUBCUTANEOUS | Status: DC
  Administered 2019-03-01: 30 [IU] via SUBCUTANEOUS
  Filled 2019-03-01: qty 0.3

## 2019-03-01 MED ORDER — LIVING WELL WITH DIABETES BOOK
Freq: Once | Status: AC
  Administered 2019-03-01: 17:00:00
  Filled 2019-03-01: qty 1

## 2019-03-01 MED ORDER — GABAPENTIN 300 MG PO CAPS
300.0000 mg | ORAL_CAPSULE | Freq: Three times a day (TID) | ORAL | Status: DC
  Administered 2019-03-01: 11:00:00 300 mg via ORAL
  Filled 2019-03-01: qty 1

## 2019-03-01 MED ORDER — METHYLPREDNISOLONE SODIUM SUCC 125 MG IJ SOLR
60.0000 mg | Freq: Two times a day (BID) | INTRAMUSCULAR | Status: DC
  Administered 2019-03-02: 60 mg via INTRAVENOUS
  Filled 2019-03-01: qty 2

## 2019-03-01 MED ORDER — CITALOPRAM HYDROBROMIDE 20 MG PO TABS
20.0000 mg | ORAL_TABLET | Freq: Every day | ORAL | Status: DC
  Administered 2019-03-01 – 2019-03-05 (×5): 20 mg via ORAL
  Filled 2019-03-01 (×6): qty 1

## 2019-03-01 MED ORDER — GUAIFENESIN ER 600 MG PO TB12: 1200.0000 mg | ORAL_TABLET | Freq: Two times a day (BID) | ORAL | Status: DC

## 2019-03-01 MED ORDER — SODIUM CHLORIDE 0.9% FLUSH
10.0000 mL | Freq: Two times a day (BID) | INTRAVENOUS | Status: DC
  Administered 2019-03-01: 10 mL
  Administered 2019-03-01: 17:00:00 3 mL
  Administered 2019-03-02 – 2019-03-05 (×7): 10 mL

## 2019-03-01 MED ORDER — LORATADINE 10 MG PO TABS
10.0000 mg | ORAL_TABLET | Freq: Every day | ORAL | Status: DC
  Administered 2019-03-01 – 2019-03-05 (×5): 10 mg via ORAL
  Filled 2019-03-01 (×5): qty 1

## 2019-03-01 MED ORDER — INSULIN GLARGINE 100 UNIT/ML ~~LOC~~ SOLN
50.0000 [IU] | Freq: Two times a day (BID) | SUBCUTANEOUS | Status: DC
  Administered 2019-03-01: 22:00:00 50 [IU] via SUBCUTANEOUS
  Filled 2019-03-01 (×2): qty 0.5

## 2019-03-01 MED ORDER — INSULIN ASPART 100 UNIT/ML ~~LOC~~ SOLN
20.0000 [IU] | Freq: Three times a day (TID) | SUBCUTANEOUS | Status: DC
  Administered 2019-03-02: 12:00:00 20 [IU] via SUBCUTANEOUS

## 2019-03-01 MED ORDER — INSULIN ASPART 100 UNIT/ML ~~LOC~~ SOLN: 0.0000 [IU] | Freq: Three times a day (TID) | SUBCUTANEOUS | Status: DC

## 2019-03-01 MED ORDER — BUDESONIDE 0.5 MG/2ML IN SUSP
0.5000 mg | Freq: Two times a day (BID) | RESPIRATORY_TRACT | Status: DC
  Administered 2019-03-01 – 2019-03-05 (×9): 0.5 mg via RESPIRATORY_TRACT
  Filled 2019-03-01 (×8): qty 2

## 2019-03-01 MED ORDER — INSULIN GLARGINE 100 UNIT/ML ~~LOC~~ SOLN
40.0000 [IU] | Freq: Two times a day (BID) | SUBCUTANEOUS | Status: DC
  Filled 2019-03-01: qty 0.4

## 2019-03-01 MED ORDER — BUDESONIDE 0.5 MG/2ML IN SUSP
RESPIRATORY_TRACT | Status: AC
  Administered 2019-03-01: 09:00:00 0.5 mg via RESPIRATORY_TRACT
  Filled 2019-03-01: qty 2

## 2019-03-01 MED ORDER — INSULIN ASPART 100 UNIT/ML ~~LOC~~ SOLN
15.0000 [IU] | Freq: Three times a day (TID) | SUBCUTANEOUS | Status: DC
  Administered 2019-03-01: 17:00:00 15 [IU] via SUBCUTANEOUS

## 2019-03-01 MED ORDER — FLUTICASONE PROPIONATE 50 MCG/ACT NA SUSP
2.0000 | Freq: Every day | NASAL | Status: DC
  Administered 2019-03-02 – 2019-03-03 (×2): 2 via NASAL
  Filled 2019-03-01: qty 16

## 2019-03-01 MED ORDER — IPRATROPIUM BROMIDE 0.02 % IN SOLN
RESPIRATORY_TRACT | Status: AC
  Administered 2019-03-01: 09:00:00 0.5 mg via RESPIRATORY_TRACT
  Filled 2019-03-01: qty 2.5

## 2019-03-01 MED ORDER — GABAPENTIN 300 MG PO CAPS
600.0000 mg | ORAL_CAPSULE | Freq: Three times a day (TID) | ORAL | Status: DC
  Administered 2019-03-01 – 2019-03-04 (×11): 600 mg via ORAL
  Administered 2019-03-05: 300 mg via ORAL
  Filled 2019-03-01 (×12): qty 2

## 2019-03-01 MED ORDER — INSULIN ASPART 100 UNIT/ML ~~LOC~~ SOLN
25.0000 [IU] | Freq: Once | SUBCUTANEOUS | Status: AC
  Administered 2019-03-01: 11:00:00 25 [IU] via SUBCUTANEOUS

## 2019-03-01 MED ORDER — INSULIN ASPART 100 UNIT/ML ~~LOC~~ SOLN
25.0000 [IU] | Freq: Once | SUBCUTANEOUS | Status: AC
  Administered 2019-03-01: 09:00:00 25 [IU] via SUBCUTANEOUS

## 2019-03-01 MED ORDER — INSULIN ASPART 100 UNIT/ML ~~LOC~~ SOLN
10.0000 [IU] | Freq: Three times a day (TID) | SUBCUTANEOUS | Status: DC
  Administered 2019-03-01: 12:00:00 10 [IU] via SUBCUTANEOUS

## 2019-03-01 MED ORDER — ALPRAZOLAM 0.25 MG PO TABS
0.2500 mg | ORAL_TABLET | Freq: Three times a day (TID) | ORAL | Status: DC | PRN
  Administered 2019-03-01 – 2019-03-05 (×8): 0.25 mg via ORAL
  Filled 2019-03-01 (×8): qty 1

## 2019-03-01 NOTE — Progress Notes (Signed)
PICC requested for lab work, patient has adequate IV access at this time per Jocelyn Lamer, Therapist, sports.  Advised that PICC will probably not be able to be placed today.  Offered ML placement by IV team until PICC can be placed.  Jocelyn Lamer, RN to place order for ML.  Carolee Rota, RN VAST

## 2019-03-01 NOTE — Progress Notes (Signed)
PROGRESS NOTE    [name]  OEH:[id number] DOB: 17-Oct-1941 DOA: 02/28/2019 PCP: [name], MD    Brief Narrative:  Per Dr Wonda Horner is a 78 y.o. male with medical history significant of chronic respiratory failure, COPD, O2 dependent ? 7 L at home, AAA (S/p endovascular repair), AVMs, anxiety, bipolar1, MI, CAD, CABG, dCHF, pression/anxiety, DM II,HTN, GERD, HLD, chronic microcytic anemia.  Presenting with worsening shortness of breath, cough over past month.  Today on 3 L he was satting in low 90s. Currently denied of having any fever or chills.  Reportedly he supposed to be on 7 L of oxygen at home, stating that he has been toled to cut it down to 3-4 L, but he desatted over past few days.  He reports a poor memory, lives with his wife who is her 81s, currently taking care of him at home.  Denies of having a fever, chills, nausea, vomiting.  Denies of having any chest pain.  Only shortness of breath. Does have any headaches visual change or asymmetric weaknesses.  Denies of having any abdominal pain constipation or diarrhea.  Denies of having any dysuria.  Denies of having any joint pain rash or open wound.    ED Course:  Patient was evaluated in ED, patient was audibly wheezing with increased respiratory efforts, but he maintained his O2 sat at 94% on 3- 4 L of oxygen, tachycardic at 102, temp 99.6, WBC 8.3, lactic acid 1.3, BNP 112, Chest x-ray SARS-CoV-2 target nucleic acids are NOT DETECTED.  She received breathing treatment, IV Solu-Medrol, was asked for the patient to be admitted due to increasing respiratory effort, for chronic respiratory failure, bronchitis.    Assessment & Plan:   Principal Problem:   Acute on chronic respiratory failure with hypoxia (HCC) Active Problems:   Hyperlipidemia   Essential hypertension   Chronic diastolic CHF (congestive heart failure) (HCC)   CAD (coronary artery disease)   AAA (abdominal aortic aneurysm)  (HCC)   AVM (arteriovenous malformation) of colon   Chronic pain syndrome   Iron deficiency anemia due to chronic blood loss   Major depressive disorder, recurrent, severe without psychotic features ([place])   Diabetes mellitus with complication (HCC)   COPD exacerbation (HCC)  1 acute on chronic respiratory failure with hypoxia secondary to acute COPD exacerbation/bronchitis Patient admitted with worsening shortness of breath, cough over the past month, increased O2 requirements from his baseline and noted to have diffuse wheezing on examination.  Chest x-ray done negative for any acute infiltrates.-SARS-CoV-2 target nucleic acids are NOT DETECTED.  Patient improving clinically however not at baseline.  Decrease IV Solu-Medrol to every 12 hours and likely transition to oral prednisone taper in the next 24 to 48 hours.  Continue azithromycin.  Place on Pulmicort, Mucinex, PPI, Claritin, Flonase, Atrovent nebs.  Continue Xopenex nebs.  Continue home regimen of oral Lasix.  Follow.  2.  Diabetes mellitus type 2 with neuropathy Hemoglobin A1c of 9.0.  Patient noted to have elevated CBGs of 549 yesterday evening and 584 this morning.  Elevated CBGs likely secondary to concomitant IV steroids.  Patient states did not use his insulin a few days prior to admission.  Labs pending for this morning.  Patient states he is on Lantus 50 units twice daily as well as NovoLog 20 units meal coverage.  However patient was started on Lantus 25 units twice daily on admission.  Increase Lantus back to home regimen of 50 units twice daily.  Add meal coverage NovoLog 15 units 3 times daily before meals.  Monitor CBGs with steroid taper.  Place on Neurontin 600 mg 3 times daily.  BMET pending.  Consult with diabetic coordinator.  3.  Hyperlipidemia Fasting lipid panel with LDL of 122.  Patient with history of coronary artery disease.  Patient started on a statin.  Outpatient follow-up.  4.  Chronic diastolic CHF/coronary  artery disease with history of CABG/MIs Currently stable.  2D echo from 10/05/2018 reviewed, ejection fraction of 60 to 65%, no LVH, no report of systolic or diastolic failure by however history reveals diastolic CHF.  Currently seems euvolemic on examination.  Continue home regimen of Lasix 60 mg daily.  Patient resumed on aspirin.  Patient's home medication list did not show that patient was on a beta-blocker or statin.  Patient started on low-dose beta-blocker and ACE inhibitor as well as statin.  Cardizem resumed this morning however per admitting physician patient was not on Cardizem.  Will discontinue Cardizem and monitor. Outpatient follow-up.  5.  History of AAA Status post repair.  Stable.  Outpatient follow-up.  6.  History of AVMs of the colon Continue PPI.  Monitor closely as patient started on low-dose aspirin.  7.  Chronic pain syndrome Placed back on Neurontin 600 mg p.o. 3 times daily.  PRN analgesics.  8.  Iron deficiency anemia due to chronic blood loss Stable.  Continue iron supplementation.  9.  Bipolar disorder/MDD, POA No psychotic features.  Stable.  Continue home regimen of Celexa and Xanax as needed.   DVT prophylaxis: Heparin. Code Status: Full Family Communication: Updated patient.  No family at bedside. Disposition Plan: Likely home when clinically improved.   Consultants:   None  Procedures:   Chest x-ray 02/28/2019  Midline 03/01/2019  Antimicrobials:   Azithromycin 02/28/2019   Subjective: Patient states some improvement with wheezing and shortness of breath.  Patient complaining of bilateral feet pain and asking whether he can be placed back on his home regimen of Neurontin 600 mg 4 times daily.  Patient states he is on 50 units of Lantus twice daily as well as NovoLog 20 units 3 times daily before meals.  Patient noted to have blood glucose levels in the 500s yesterday evening and 558 this morning.  Objective: Vitals:   03/01/19 0237 03/01/19  0338 03/01/19 0500 03/01/19 0842  BP:  138/80    Pulse:  92    Resp:  (!) 21    Temp:  98.2 F (36.8 C)    TempSrc:  Oral    SpO2: 92% 98%  97%  Weight:   133 kg   Height:        Intake/Output Summary (Last 24 hours) at 03/01/2019 1209 Last data filed at 03/01/2019 1000 Gross per 24 hour  Intake 900 ml  Output 1450 ml  Net -550 ml   Filed Weights   02/28/19 0502 03/01/19 0500  Weight: 118.8 kg 133 kg    Examination:  General exam: Appears calm and comfortable  Respiratory system: Clear to auscultation.  Fair air movement.  Minimal expiratory wheezing.  No rhonchi.  No crackles.  Speaking in full sentences.  Respiratory effort normal. Cardiovascular system: Tachycardia.  No JVD, no murmurs, no rubs, no gallops.  No lower extremity edema.  Gastrointestinal system: Abdomen is soft, nontender, nondistended, positive bowel sounds.  No rebound.  No guarding.  Central nervous system: Alert and oriented. No focal neurological deficits. Extremities: Symmetric 5 x 5 power. Skin: No rashes,  lesions or ulcers Psychiatry: Judgement and insight appear normal. Mood & affect appropriate.     Data Reviewed: I have personally reviewed following labs and imaging studies  CBC: Recent Labs  Lab 02/28/19 0512  WBC 8.3  NEUTROABS 6.0  HGB 9.4*  HCT 32.3*  MCV 98.5  PLT 277   Basic Metabolic Panel: Recent Labs  Lab 02/28/19 0512 02/28/19 0918 02/28/19 1706  NA 136  --   --   K 4.9  --   --   CL 96*  --   --   CO2 35*  --   --   GLUCOSE 301*  --  549*  BUN 22  --   --   CREATININE 1.09  --   --   CALCIUM 8.1*  --   --   MG  --  2.2  --   PHOS  --  1.4*  --    GFR: Estimated Creatinine Clearance: 83.4 mL/min (by C-G formula based on SCr of 1.09 mg/dL). Liver Function Tests: Recent Labs  Lab 02/28/19 0512  AST 14*  ALT 15  ALKPHOS 122  BILITOT 0.3  PROT 6.3*  ALBUMIN 3.1*   No results for input(s): LIPASE, AMYLASE in the last 168 hours. No results for input(s):  AMMONIA in the last 168 hours. Coagulation Profile: No results for input(s): INR, PROTIME in the last 168 hours. Cardiac Enzymes: Recent Labs  Lab 02/28/19 0512  TROPONINI <0.03   BNP (last 3 results) No results for input(s): PROBNP in the last 8760 hours. HbA1C: Recent Labs    02/28/19 0929  HGBA1C 9.0*   CBG: Recent Labs  Lab 02/28/19 1957 03/01/19 0821 03/01/19 0936 03/01/19 0959 03/01/19 1148  GLUCAP 477* 456* 584* 558* 485*   Lipid Profile: Recent Labs    02/28/19 1153  CHOL 214*  HDL 36*  LDLCALC 122*  TRIG 278*  CHOLHDL 5.9   Thyroid Function Tests: Recent Labs    02/28/19 0919  TSH 2.528   Anemia Panel: No results for input(s): VITAMINB12, FOLATE, FERRITIN, TIBC, IRON, RETICCTPCT in the last 72 hours. Sepsis Labs: Recent Labs  Lab 02/28/19 0917 02/28/19 0918 02/28/19 1212  PROCALCITON  --  0.15  --   LATICACIDVEN 1.3  --  2.1*    Recent Results (from the past 240 hour(s))  SARS Coronavirus 2 [facility] order, Performed in [place] hospital lab)     Status: None   Collection Time: 02/28/19  5:56 AM  Result Value Ref Range Status   SARS Coronavirus 2 NEGATIVE NEGATIVE Final    Comment: (NOTE) If result is NEGATIVE SARS-CoV-2 target nucleic acids are NOT DETECTED. The SARS-CoV-2 RNA is generally detectable in upper and lower  respiratory specimens during the acute phase of infection. The lowest  concentration of SARS-CoV-2 viral copies this assay can detect is 250  copies / mL. A negative result does not preclude SARS-CoV-2 infection  and should not be used as the sole basis for treatment or other  patient management decisions.  A negative result may occur with  improper specimen collection / handling, submission of specimen other  than nasopharyngeal swab, presence of viral mutation(s) within the  areas targeted by this assay, and inadequate number of viral copies  (<250 copies / mL). A negative result must be combined with clinical   observations, patient history, and epidemiological information. If result is POSITIVE SARS-CoV-2 target nucleic acids are DETECTED. The SARS-CoV-2 RNA is generally detectable in upper and lower  respiratory specimens  dur ing the acute phase of infection.  Positive  results are indicative of active infection with SARS-CoV-2.  Clinical  correlation with patient history and other diagnostic information is  necessary to determine patient infection status.  Positive results do  not rule out bacterial infection or co-infection with other viruses. If result is PRESUMPTIVE POSTIVE SARS-CoV-2 nucleic acids MAY BE PRESENT.   A presumptive positive result was obtained on the submitted specimen  and confirmed on repeat testing.  While 2019 novel coronavirus  (SARS-CoV-2) nucleic acids may be present in the submitted sample  additional confirmatory testing may be necessary for epidemiological  and / or clinical management purposes  to differentiate between  SARS-CoV-2 and other Sarbecovirus currently known to infect humans.  If clinically indicated additional testing with an alternate test  methodology [phone]) is advised. The SARS-CoV-2 RNA is generally  detectable in upper and lower respiratory sp ecimens during the acute  phase of infection. The expected result is Negative. Fact Sheet for Patients:  StrictlyIdeas.no Fact Sheet for Healthcare Providers: BankingDealers.co.za This test is not yet approved or cleared by the [place] FDA and has been authorized for detection and/or diagnosis of SARS-CoV-2 by FDA under an Emergency Use Authorization (EUA).  This EUA will remain in effect (meaning this test can be used) for the duration of the COVID-19 declaration under Section 564(b)(1) of the Act, 21 U.S.C. section 360bbb-3(b)(1), unless the authorization is terminated or revoked sooner. Performed at [facility], [place]  [address] [place], [place] [address]          Radiology Studies: Dg Chest Port 1 View  Result Date: 02/28/2019 CLINICAL DATA:  78 year old male with shortness of breath cough and fever for 48 hours. COVID-19 status pending. EXAM: PORTABLE CHEST 1 VIEW COMPARISON:  12/26/2018 and earlier. FINDINGS: Portable AP semi upright view at 0500 hours. Prior CABG. Stable mediastinal contours, with moderate size gastric hiatal hernia demonstrated by CT in December. 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 Stable lung volumes. Increased pulmonary interstitial opacity and/or vascularity since March. No pneumothorax or new confluent pulmonary opacity. Paucity bowel gas in the upper abdomen. IMPRESSION: 1. Increased pulmonary interstitial opacity or vascularity since March. Consider interstitial edema versus viral/atypical respiratory infection. 2. 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 hiatal hernia. Electronically Signed   By: Genevie Ann M.D.   On: 02/28/2019 05:55   [place] Ekg Site Rite  Result Date: 03/01/2019 If Site Rite image not attached, placement could not be confirmed due to current cardiac rhythm.       Scheduled Meds: . aspirin EC  81 mg Oral Daily  . atorvastatin  10 mg Oral q1800  . azithromycin  500 mg Oral Daily  . budesonide (PULMICORT) nebulizer solution  0.5 mg Nebulization BID  . citalopram  20 mg Oral Daily  . diltiazem  240 mg Oral Daily  . docusate sodium  100 mg Oral BID  . ferrous sulfate  325 mg Oral TID WC  . fluticasone  2 spray Each Nare Daily  . furosemide  60 mg Oral Daily  . gabapentin  300 mg Oral TID  . guaiFENesin  1,200 mg Oral BID  . guaiFENesin-dextromethorphan  5 mL Oral Q6H  . heparin  5,000 Units Subcutaneous Q8H  . insulin aspart  0-20 Units Subcutaneous Q4H  . insulin aspart  0-5 Units Subcutaneous QHS  . insulin  aspart  10 Units  Subcutaneous TID WC  . insulin glargine  40 Units Subcutaneous BID  . ipratropium  0.5 mg Nebulization Q6H  . levalbuterol  0.63 mg Nebulization Q6H  . lisinopril  2.5 mg Oral Daily  . loratadine  10 mg Oral Daily  . methylPREDNISolone (SOLU-MEDROL) injection  60 mg Intravenous Q6H  . metoprolol tartrate  12.5 mg Oral BID  . pantoprazole  40 mg Oral BID   Continuous Infusions:   LOS: 1 day    Time spent: 35 minutes    Irine Seal, MD [facility]  If 7PM-7AM, please contact night-coverage [email or web address] Password [facility] 03/01/2019, 12:09 PM

## 2019-03-01 NOTE — Progress Notes (Signed)
Night time Blood glucose at 2000 is 411. Will notify on call.

## 2019-03-01 NOTE — Progress Notes (Addendum)
Inpatient Diabetes Program Recommendations  AACE/ADA: New Consensus Statement on Inpatient Glycemic Control (2015)  Target Ranges:  Prepandial:   less than 140 mg/dL      Peak postprandial:   less than 180 mg/dL (1-2 hours)      Critically ill patients:  140 - 180 mg/dL   Lab Results  Component Value Date   VANVBT 660 (H) 03/01/2019   HGBA1C 9.0 (H) 02/28/2019   Working remotely  Review of Glycemic Control  Diabetes history: DM2 Outpatient Diabetes medications: Lantus 50 units bid, Novolog 20 units tidwc Current orders for Inpatient glycemic control: Lantus 50 units bid, Novolog 15 units tidwc  HgbA1C - 9.0% Diabetes Coordinators have spoken to pt regarding his diabetes approx 2 years ago. Have called pt's room to discuss his diabetes control at home and he does not answer. Will try again late this afternoon. Blood sugars 456-558 mg/dL over past 24H. Blood sugars likely high d/t Solumedrol 60 mg Q12H.   Inpatient Diabetes Program Recommendations:     Agree with orders. Lantus and Novolog has been titrated. Awaiting BMET results. Attempted to reach pt by phone to discuss his diabetes management and HgbA1C of 9%. Did not answer.  Will order Living Well with Diabetes book and pt will need to f/u with PCP for diabetes management.  Continue to follow.  Thank you. Lorenda Peck, RD, LDN, CDE Inpatient Diabetes Coordinator [phone]

## 2019-03-01 NOTE — Progress Notes (Signed)
Patient instructed on use of flutter valve.

## 2019-03-02 LAB — PHOSPHORUS: Phosphorus: 3.6 mg/dL (ref 2.5–4.6)

## 2019-03-02 LAB — BASIC METABOLIC PANEL
Anion gap: 13 (ref 5–15)
BUN: 49 mg/dL — ABNORMAL HIGH (ref 8–23)
CO2: 29 mmol/L (ref 22–32)
Calcium: 8 mg/dL — ABNORMAL LOW (ref 8.9–10.3)
Chloride: 89 mmol/L — ABNORMAL LOW (ref 98–111)
Creatinine, Ser: 1.49 mg/dL — ABNORMAL HIGH (ref 0.61–1.24)
GFR calc Af Amer: 52 mL/min — ABNORMAL LOW (ref >60)
GFR calc non Af Amer: 45 mL/min — ABNORMAL LOW (ref >60)
Glucose, Bld: 421 mg/dL — ABNORMAL HIGH (ref 70–99)
Potassium: 4.2 mmol/L (ref 3.5–5.1)
Sodium: 131 mmol/L — ABNORMAL LOW (ref 135–145)

## 2019-03-02 LAB — CBC
HCT: 29 % — ABNORMAL LOW (ref 39.0–52.0)
Hemoglobin: 8.9 g/dL — ABNORMAL LOW (ref 13.0–17.0)
MCH: 29.7 pg (ref 26.0–34.0)
MCHC: 30.7 g/dL (ref 30.0–36.0)
MCV: 96.7 fL (ref 80.0–100.0)
Platelets: 236 10*3/uL (ref 150–400)
RBC: 3 MIL/uL — ABNORMAL LOW (ref 4.22–5.81)
RDW: 14.7 % (ref 11.5–15.5)
WBC: 12.5 10*3/uL — ABNORMAL HIGH (ref 4.0–10.5)
nRBC: 0 % (ref 0.0–0.2)

## 2019-03-02 LAB — GLUCOSE, CAPILLARY
Glucose-Capillary: 461 mg/dL — ABNORMAL HIGH (ref 70–99)
Glucose-Capillary: 391 mg/dL — ABNORMAL HIGH (ref 70–99)
Glucose-Capillary: 412 mg/dL — ABNORMAL HIGH (ref 70–99)
Glucose-Capillary: 398 mg/dL — ABNORMAL HIGH (ref 70–99)
Glucose-Capillary: 316 mg/dL — ABNORMAL HIGH (ref 70–99)
Glucose-Capillary: 426 mg/dL — ABNORMAL HIGH (ref 70–99)

## 2019-03-02 MED ORDER — INSULIN ASPART 100 UNIT/ML ~~LOC~~ SOLN
0.0000 [IU] | Freq: Three times a day (TID) | SUBCUTANEOUS | Status: DC
  Administered 2019-03-02: 20 [IU] via SUBCUTANEOUS
  Administered 2019-03-03: 13:00:00 15 [IU] via SUBCUTANEOUS
  Administered 2019-03-03: 08:00:00 20 [IU] via SUBCUTANEOUS
  Administered 2019-03-03: 7 [IU] via SUBCUTANEOUS
  Administered 2019-03-04: 17:00:00 15 [IU] via SUBCUTANEOUS
  Administered 2019-03-04: 7 [IU] via SUBCUTANEOUS
  Administered 2019-03-04: 12:00:00 15 [IU] via SUBCUTANEOUS
  Administered 2019-03-05: 09:00:00 3 [IU] via SUBCUTANEOUS
  Administered 2019-03-05: 13:00:00 7 [IU] via SUBCUTANEOUS

## 2019-03-02 MED ORDER — INSULIN GLARGINE 100 UNIT/ML ~~LOC~~ SOLN
55.0000 [IU] | Freq: Two times a day (BID) | SUBCUTANEOUS | Status: DC
  Administered 2019-03-02 (×2): 55 [IU] via SUBCUTANEOUS
  Filled 2019-03-02 (×3): qty 0.55

## 2019-03-02 MED ORDER — INSULIN ASPART 100 UNIT/ML ~~LOC~~ SOLN
10.0000 [IU] | Freq: Once | SUBCUTANEOUS | Status: AC
  Administered 2019-03-02: 22:00:00 10 [IU] via SUBCUTANEOUS

## 2019-03-02 MED ORDER — PREDNISONE 50 MG PO TABS
60.0000 mg | ORAL_TABLET | Freq: Every day | ORAL | Status: DC
  Administered 2019-03-02: 60 mg via ORAL
  Filled 2019-03-02: qty 1

## 2019-03-02 MED ORDER — INSULIN ASPART 100 UNIT/ML ~~LOC~~ SOLN
24.0000 [IU] | Freq: Once | SUBCUTANEOUS | Status: AC
  Administered 2019-03-02: 12:00:00 24 [IU] via SUBCUTANEOUS

## 2019-03-02 NOTE — Progress Notes (Signed)
50 units Lantus given at 2130. BS still up to 460s overnight. 20 units novolog also given at 0000 and 0400. Will continue to monitor.

## 2019-03-02 NOTE — Progress Notes (Signed)
PROGRESS NOTE    [name]  MBT:[id number] DOB: 02/09/1941 DOA: 02/28/2019 PCP: [name], MD    Brief Narrative:  Per Dr Wonda Horner is a 78 y.o. male with medical history significant of chronic respiratory failure, COPD, O2 dependent ? 7 L at home, AAA (S/p endovascular repair), AVMs, anxiety, bipolar1, MI, CAD, CABG, dCHF, pression/anxiety, DM II,HTN, GERD, HLD, chronic microcytic anemia.  Presenting with worsening shortness of breath, cough over past month.  Today on 3 L he was satting in low 90s. Currently denied of having any fever or chills.  Reportedly he supposed to be on 7 L of oxygen at home, stating that he has been toled to cut it down to 3-4 L, but he desatted over past few days.  He reports a poor memory, lives with his wife who is her 22s, currently taking care of him at home.  Denies of having a fever, chills, nausea, vomiting.  Denies of having any chest pain.  Only shortness of breath. Does have any headaches visual change or asymmetric weaknesses.  Denies of having any abdominal pain constipation or diarrhea.  Denies of having any dysuria.  Denies of having any joint pain rash or open wound.    ED Course:  Patient was evaluated in ED, patient was audibly wheezing with increased respiratory efforts, but he maintained his O2 sat at 94% on 3- 4 L of oxygen, tachycardic at 102, temp 99.6, WBC 8.3, lactic acid 1.3, BNP 112, Chest x-ray SARS-CoV-2 target nucleic acids are NOT DETECTED.  She received breathing treatment, IV Solu-Medrol, was asked for the patient to be admitted due to increasing respiratory effort, for chronic respiratory failure, bronchitis.    Assessment & Plan:   Principal Problem:   Acute on chronic respiratory failure with hypoxia (HCC) Active Problems:   Hyperlipidemia   Essential hypertension   Chronic diastolic CHF (congestive heart failure) (HCC)   CAD (coronary artery disease)   AAA (abdominal aortic aneurysm)  (HCC)   AVM (arteriovenous malformation) of colon   Chronic pain syndrome   Iron deficiency anemia due to chronic blood loss   Major depressive disorder, recurrent, severe without psychotic features ([place])   Diabetes mellitus with complication (HCC)   COPD exacerbation (HCC)  1 acute on chronic respiratory failure with hypoxia secondary to acute COPD exacerbation/bronchitis Patient admitted with worsening shortness of breath, cough over the past month, increased O2 requirements from his baseline and noted to have diffuse wheezing on examination.  Chest x-ray done negative for any acute infiltrates.-SARS-CoV-2 target nucleic acids are NOT DETECTED.  Patient slowly improving clinically however not at baseline.  Will transition from IV Solu-Medrol to oral prednisone taper.  Continue azithromycin.  Continue Pulmicort, Mucinex, PPI, Claritin, Flonase, Atrovent nebs.  Continue Xopenex nebs.  Continue home regimen of oral Lasix.  Follow.  Outpatient follow-up with pulmonary on discharge.  2.  Diabetes mellitus type 2 with neuropathy Hemoglobin A1c of 9.0.  Patient noted to have elevated CBGs of 400-500s yesterday.  CBG of 391 this morning and up to 412 by lunchtime.  Elevated CBGs likely secondary to concomitant IV steroids.  Patient states did not use his insulin a few days prior to admission.  Labs pending for this morning.  Patient states he is on Lantus 50 units twice daily as well as NovoLog 20 units meal coverage.  However patient was started on Lantus 25 units twice daily on admission.  Increased Lantus back to home regimen of 50 units twice  daily.  Patient currently on new coverage of NovoLog 15 units 3 times daily.  Increase Lantus to 55 units twice daily.  Increase NovoLog meal coverage to 20 units 3 times daily with meals.  Discontinue IV steroids and placed on oral prednisone taper.  Continue Neurontin 600 mg 3 times daily.  Diabetic coordinator following.   3.  Hyperlipidemia Fasting lipid panel  with LDL of 122.  Patient with history of coronary artery disease.  Patient started on a statin.  Outpatient follow-up.  4.  Chronic diastolic CHF/coronary artery disease with history of CABG/MIs Currently stable.  2D echo from 10/05/2018 reviewed, ejection fraction of 60 to 65%, no LVH, no report of systolic or diastolic failure by however history reveals diastolic CHF.  Currently seems euvolemic on examination.  Continue home regimen of Lasix 60 mg daily.  Patient resumed on aspirin which we will d/c due to hx of AVM.  Patient's home medication list did not show that patient was on a beta-blocker or statin.  Patient started on low-dose beta-blocker and ACE inhibitor as well as statin.  Cardizem has been discontinued.  Patient will need outpatient follow-up with cardiology post discharge.   5.  History of AAA Status post repair.  Stable.  Outpatient follow-up.  6.  History of AVMs of the colon Continue PPI.  DC aspirin.   7.  Chronic pain syndrome Continue Neurontin 600 mg p.o. 3 times daily.  PRN analgesics.  8.  Iron deficiency anemia due to chronic blood loss Stable.  Continue iron supplementation.  9.  Bipolar disorder/MDD, POA Stable.  No psychotic features noted.  Continue home regimen of Celexa and Xanax as needed.    DVT prophylaxis: Heparin. Code Status: Full Family Communication: Updated patient.  No family at bedside. Disposition Plan: Likely home when clinically improved and blood sugars improved hopefully in 48 hours..   Consultants:   None  Procedures:   Chest x-ray 02/28/2019  Midline 03/01/2019  Antimicrobials:   Azithromycin 02/28/2019>>> 03/04/2019   Subjective: Patient in bed.  States shortness of breath and wheezing improving however not at baseline.  Denies any chest pain.  Tolerating current diet.  CBG of 391 this morning.   Objective: Vitals:   03/02/19 0441 03/02/19 0445 03/02/19 0736 03/02/19 0740  BP: 127/77     Pulse: 73     Resp: 16      Temp: (!) 97.5 F (36.4 C)     TempSrc: Oral     SpO2: 95%  96% 96%  Weight:  126.2 kg    Height:        Intake/Output Summary (Last 24 hours) at 03/02/2019 1246 Last data filed at 03/02/2019 0700 Gross per 24 hour  Intake -  Output 850 ml  Net -850 ml   Filed Weights   02/28/19 0502 03/01/19 0500 03/02/19 0445  Weight: 118.8 kg 133 kg 126.2 kg    Examination:  General exam: NAD Respiratory system: CTAB.  Fair air movement. No expiratory wheezing.  No rhonchi.  No crackles.  Speaking in full sentences.  Respiratory effort normal. Cardiovascular system: Regular rate and rhythm no murmurs rubs or gallops.  No JVD.  No lower extremity edema. Gastrointestinal system: Abdomen is soft, nontender, nondistended, obese, positive bowel sounds.  No rebound.  No guarding.   Central nervous system: Alert and oriented. No focal neurological deficits. Extremities: Symmetric 5 x 5 power. Skin: No rashes, lesions or ulcers Psychiatry: Judgement and insight appear normal. Mood & affect appropriate.  Data Reviewed: I have personally reviewed following labs and imaging studies  CBC: Recent Labs  Lab 02/28/19 0512 03/01/19 1335 03/02/19 0318  WBC 8.3 12.5* 12.5*  NEUTROABS 6.0 10.9*  --   HGB 9.4* 9.5* 8.9*  HCT 32.3* 30.6* 29.0*  MCV 98.5 94.4 96.7  PLT 186 244 478   Basic Metabolic Panel: Recent Labs  Lab 02/28/19 0512 02/28/19 0918 02/28/19 1706 03/01/19 1335 03/02/19 0318  NA 136  --   --  133* 131*  K 4.9  --   --  4.0 4.2  CL 96*  --   --  90* 89*  CO2 35*  --   --  30 29  GLUCOSE 301*  --  549* 383* 421*  BUN 22  --   --  41* 49*  CREATININE 1.09  --   --  1.39* 1.49*  CALCIUM 8.1*  --   --  8.4* 8.0*  MG  --  2.2  --   --   --   PHOS  --  1.4*  --  1.7* 3.6   GFR: Estimated Creatinine Clearance: 59.4 mL/min (A) (by C-G formula based on SCr of 1.49 mg/dL (H)). Liver Function Tests: Recent Labs  Lab 02/28/19 0512  AST 14*  ALT 15  ALKPHOS 122  BILITOT  0.3  PROT 6.3*  ALBUMIN 3.1*   No results for input(s): LIPASE, AMYLASE in the last 168 hours. No results for input(s): AMMONIA in the last 168 hours. Coagulation Profile: No results for input(s): INR, PROTIME in the last 168 hours. Cardiac Enzymes: Recent Labs  Lab 02/28/19 0512  TROPONINI <0.03   BNP (last 3 results) No results for input(s): PROBNP in the last 8760 hours. HbA1C: Recent Labs    02/28/19 0929  HGBA1C 9.0*   CBG: Recent Labs  Lab 03/01/19 2000 03/01/19 2355 03/02/19 0348 03/02/19 0730 03/02/19 1137  GLUCAP 411* 460* 461* 391* 412*   Lipid Profile: Recent Labs    02/28/19 1153  CHOL 214*  HDL 36*  LDLCALC 122*  TRIG 278*  CHOLHDL 5.9   Thyroid Function Tests: Recent Labs    02/28/19 0919  TSH 2.528   Anemia Panel: No results for input(s): VITAMINB12, FOLATE, FERRITIN, TIBC, IRON, RETICCTPCT in the last 72 hours. Sepsis Labs: Recent Labs  Lab 02/28/19 0917 02/28/19 0918 02/28/19 1212  PROCALCITON  --  0.15  --   LATICACIDVEN 1.3  --  2.1*    Recent Results (from the past 240 hour(s))  SARS Coronavirus 2 [facility] order, Performed in [facility] hospital lab)     Status: None   Collection Time: 02/28/19  5:56 AM  Result Value Ref Range Status   SARS Coronavirus 2 NEGATIVE NEGATIVE Final    Comment: (NOTE) If result is NEGATIVE SARS-CoV-2 target nucleic acids are NOT DETECTED. The SARS-CoV-2 RNA is generally detectable in upper and lower  respiratory specimens during the acute phase of infection. The lowest  concentration of SARS-CoV-2 viral copies this assay can detect is 250  copies / mL. A negative result does not preclude SARS-CoV-2 infection  and should not be used as the sole basis for treatment or other  patient management decisions.  A negative result may occur with  improper specimen collection / handling, submission of specimen other  than nasopharyngeal swab, presence of viral mutation(s) within the  areas targeted by  this assay, and inadequate number of viral copies  (<250 copies / mL). A negative result must be combined with  clinical  observations, patient history, and epidemiological information. If result is POSITIVE SARS-CoV-2 target nucleic acids are DETECTED. The SARS-CoV-2 RNA is generally detectable in upper and lower  respiratory specimens dur ing the acute phase of infection.  Positive  results are indicative of active infection with SARS-CoV-2.  Clinical  correlation with patient history and other diagnostic information is  necessary to determine patient infection status.  Positive results do  not rule out bacterial infection or co-infection with other viruses. If result is PRESUMPTIVE POSTIVE SARS-CoV-2 nucleic acids MAY BE PRESENT.   A presumptive positive result was obtained on the submitted specimen  and confirmed on repeat testing.  While 2019 novel coronavirus  (SARS-CoV-2) nucleic acids may be present in the submitted sample  additional confirmatory testing may be necessary for epidemiological  and / or clinical management purposes  to differentiate between  SARS-CoV-2 and other Sarbecovirus currently known to infect humans.  If clinically indicated additional testing with an alternate test  methodology [phone]) is advised. The SARS-CoV-2 RNA is generally  detectable in upper and lower respiratory sp ecimens during the acute  phase of infection. The expected result is Negative. Fact Sheet for Patients:  StrictlyIdeas.no Fact Sheet for Healthcare Providers: BankingDealers.co.za This test is not yet approved or cleared by the [place] FDA and has been authorized for detection and/or diagnosis of SARS-CoV-2 by FDA under an Emergency Use Authorization (EUA).  This EUA will remain in effect (meaning this test can be used) for the duration of the COVID-19 declaration under Section 564(b)(1) of the Act, 21 U.S.C. section  360bbb-3(b)(1), unless the authorization is terminated or revoked sooner. Performed at [facility], [place] [address], [place], [place] [address]          Radiology Studies: [place] Ekg Site Rite  Result Date: 03/01/2019 If [facility] image not attached, placement could not be confirmed due to current cardiac rhythm.       Scheduled Meds: . atorvastatin  10 mg Oral q1800  . azithromycin  500 mg Oral Daily  . budesonide (PULMICORT) nebulizer solution  0.5 mg Nebulization BID  . citalopram  20 mg Oral Daily  . docusate sodium  100 mg Oral BID  . ferrous sulfate  325 mg Oral TID WC  . fluticasone  2 spray Each Nare Daily  . furosemide  60 mg Oral Daily  . gabapentin  600 mg Oral TID  . guaiFENesin  1,200 mg Oral BID  . guaiFENesin-dextromethorphan  5 mL Oral Q6H  . heparin  5,000 Units Subcutaneous Q8H  . insulin aspart  0-20 Units Subcutaneous TID WC  . insulin aspart  0-5 Units Subcutaneous QHS  . insulin aspart  20 Units Subcutaneous TID WC  . insulin glargine  55 Units Subcutaneous BID  . ipratropium  0.5 mg Nebulization Q6H  . levalbuterol  0.63 mg Nebulization Q6H  . lisinopril  2.5 mg Oral Daily  . loratadine  10 mg Oral Daily  . metoprolol tartrate  12.5 mg Oral BID  . pantoprazole  40 mg Oral BID  . predniSONE  60 mg Oral QAC breakfast  . sodium chloride flush  10-40 mL Intracatheter Q12H   Continuous Infusions:   LOS: 2 days    Time spent: 35 minutes    Irine Seal, MD [facility]  If 7PM-7AM, please contact night-coverage [email or web address] Password [facility] 03/02/2019, 12:46 PM

## 2019-03-02 NOTE — Progress Notes (Signed)
Spoke with Rolene Course, RN, concerning order for PICC for lab draws. Patient has two PIVs. One of which is a Midline, which still draws blood, per RN.  Order to be discontinued. Nurse will inform Dr. Grandville Silos. If still needed, PICC to be reordered.

## 2019-03-03 LAB — GLUCOSE, CAPILLARY
Glucose-Capillary: 217 mg/dL — ABNORMAL HIGH (ref 70–99)
Glucose-Capillary: 348 mg/dL — ABNORMAL HIGH (ref 70–99)
Glucose-Capillary: 353 mg/dL — ABNORMAL HIGH (ref 70–99)
Glucose-Capillary: 384 mg/dL — ABNORMAL HIGH (ref 70–99)
Glucose-Capillary: 412 mg/dL — ABNORMAL HIGH (ref 70–99)

## 2019-03-03 LAB — CBC
HCT: 31.6 % — ABNORMAL LOW (ref 39.0–52.0)
Hemoglobin: 9.6 g/dL — ABNORMAL LOW (ref 13.0–17.0)
MCH: 29.3 pg (ref 26.0–34.0)
MCHC: 30.4 g/dL (ref 30.0–36.0)
MCV: 96.3 fL (ref 80.0–100.0)
Platelets: 262 10*3/uL (ref 150–400)
RBC: 3.28 MIL/uL — ABNORMAL LOW (ref 4.22–5.81)
RDW: 14.6 % (ref 11.5–15.5)
WBC: 10.5 10*3/uL (ref 4.0–10.5)
nRBC: 0.4 % — ABNORMAL HIGH (ref 0.0–0.2)

## 2019-03-03 LAB — BASIC METABOLIC PANEL
Anion gap: 15 (ref 5–15)
BUN: 51 mg/dL — ABNORMAL HIGH (ref 8–23)
CO2: 32 mmol/L (ref 22–32)
Calcium: 8.4 mg/dL — ABNORMAL LOW (ref 8.9–10.3)
Chloride: 86 mmol/L — ABNORMAL LOW (ref 98–111)
Creatinine, Ser: 1.39 mg/dL — ABNORMAL HIGH (ref 0.61–1.24)
GFR calc Af Amer: 56 mL/min — ABNORMAL LOW (ref >60)
GFR calc non Af Amer: 49 mL/min — ABNORMAL LOW (ref >60)
Glucose, Bld: 396 mg/dL — ABNORMAL HIGH (ref 70–99)
Potassium: 4.4 mmol/L (ref 3.5–5.1)
Sodium: 133 mmol/L — ABNORMAL LOW (ref 135–145)

## 2019-03-03 LAB — PHOSPHORUS: Phosphorus: 4.1 mg/dL (ref 2.5–4.6)

## 2019-03-03 MED ORDER — PREDNISONE 50 MG PO TABS
60.0000 mg | ORAL_TABLET | ORAL | Status: AC
  Administered 2019-03-03: 60 mg via ORAL
  Filled 2019-03-03: qty 1

## 2019-03-03 MED ORDER — INSULIN GLARGINE 100 UNIT/ML ~~LOC~~ SOLN
60.0000 [IU] | Freq: Two times a day (BID) | SUBCUTANEOUS | Status: DC
  Administered 2019-03-03 – 2019-03-04 (×3): 60 [IU] via SUBCUTANEOUS
  Filled 2019-03-03 (×4): qty 0.6

## 2019-03-03 MED ORDER — INSULIN ASPART 100 UNIT/ML ~~LOC~~ SOLN
24.0000 [IU] | Freq: Three times a day (TID) | SUBCUTANEOUS | Status: DC
  Administered 2019-03-03 – 2019-03-04 (×4): 24 [IU] via SUBCUTANEOUS

## 2019-03-03 MED ORDER — PREDNISONE 20 MG PO TABS
40.0000 mg | ORAL_TABLET | Freq: Every day | ORAL | Status: AC
  Administered 2019-03-04: 08:00:00 40 mg via ORAL
  Filled 2019-03-03: qty 2

## 2019-03-03 NOTE — Evaluation (Addendum)
Occupational Therapy Evaluation Patient Details Name: [name] MRN: [id number] DOB: Mar 15, 1941 Today's Date: 03/03/2019    History of Present Illness [name] is a 78 y.o. male adm with acute on chronic respiratory failure. medical history significant of chronic respiratory failure, COPD, O2 dependent ? 7 L at home, AAA (S/p endovascular repair), AVMs, anxiety, bipolar1, MI, CAD, CABG, dCHF, pression/anxiety, DM II,HTN, GERD, HLD, chronic microcytic anemia.    Clinical Impression   Pt admitted with above diagnoses, with generalized weakness and cardiopulm status limiting ability to engage in BADL at desired level of ind. Pt reports limited assist from wife, and having increased difficulty completing ADL. PTA pt living with wife, stating she has limited ability to help. Reports 2 falls recently. Pt very irritable and irate at start of session, complaining about several staff members and care with request to speak to charge RN. Pt then asking OT to close the door so he can speak to OT about complaints. 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 Pt very tangential and difficult to redirect due to irritability. Pt wanting sponge bath, OT assist at max- total A, but pt appearing self limiting. BSC t/f and bed mobility completed all at min guard. Pt fatgiues easily, on 5L [place] this session with VSS. Pt then set up with meal, requiring min A to cut food for easier mastication due to poor dentition and little desire to wear dentures. Pt irritability subsided after sponge bath, but remaining tangential and appearing to have executive functioning deficits (safety, awareness of deficits, temper, etc.) At this time recommend pt receive SNF vs HHOT at d/c, although pt will likely refuse bith options because he believes "they're all racist". Pt will benefit from continued skilled OT to increase BADL safe BADL tolerance. Will  follow.    Follow Up Recommendations  Home health OT;SNF;Other (comment)(anticipate pt to refuse both options)    Equipment Recommendations  Other (comment)(TBD)    Recommendations for Other Services       Precautions / Restrictions Precautions Precautions: Fall;Other (comment) Precaution Comments: O2 dependent Restrictions Weight Bearing Restrictions: No      Mobility Bed Mobility Overal bed mobility: Needs Assistance Bed Mobility: Supine to Sit     Supine to sit: Min guard     General bed mobility comments: close min guard for safety  Transfers Overall transfer level: Needs assistance Equipment used: None Transfers: Sit to/from Stand Sit to Stand: Min guard         General transfer comment: close min guard for safety; able to stand and use urinal without physical assist    Balance Overall balance assessment: History of Falls(reports 2 falls recently at home)                                         ADL either performed or assessed with clinical judgement   ADL Overall ADL's : Needs assistance/impaired Eating/Feeding: Minimal assistance;Sitting Eating/Feeding Details (indicate cue type and reason): sitting EOB, needs A to cut up meal to eat (no teeth and does not wish to wear dentures) Grooming: Set up;Sitting   Upper Body Bathing: Maximal assistance;Sitting Upper Body Bathing Details (indicate cue type and reason): while sitting on commode, max A for sponge bath but pt is self limiting Lower Body Bathing: Maximal assistance;Sit to/from stand Lower  Body Bathing Details (indicate cue type and reason): while sitting on BSC, max A, pt self limiting     Lower Body Dressing: Total assistance;Sit to/from stand Lower Body Dressing Details (indicate cue type and reason): to don socks Toilet Transfer: Min Art therapist Details (indicate cue type and reason): min guard to [facility], also stood x2 to urinate at min guard for safety Toileting-  Clothing Manipulation and Hygiene: Moderate assistance;Sit to/from stand;Sitting/lateral lean Toileting - Clothing Manipulation Details (indicate cue type and reason): pt needing mod A to complete/do accurate job   Clinical cytogeneticist Details (indicate cue type and reason): not tested due to pt sponge bathing at baseline; anticiapte pt min A for shower t/f Functional mobility during ADLs: Min guard;Minimal assistance General ADL Comments: pt very irritable but wanting sponge bath; completed sponge bath, stand to urinate x2, BSC t/f and lotion application this session     Vision Baseline Vision/History: Wears glasses Patient Visual Report: No change from baseline       Perception     Praxis      Pertinent Vitals/Pain Pain Assessment: No/denies pain     Hand Dominance     Extremity/Trunk Assessment Upper Extremity Assessment Upper Extremity Assessment: Generalized weakness   Lower Extremity Assessment Lower Extremity Assessment: Defer to PT evaluation       Communication Communication Communication: HOH   Cognition Arousal/Alertness: Awake/alert Behavior During Therapy: Agitated Overall Cognitive Status: Within Functional Limits for tasks assessed Area of Impairment: Safety/judgement;Memory                         Safety/Judgement: Decreased awareness of deficits   Problem Solving: Decreased initiation;Requires verbal cues General Comments: pt very irritable at start of session with long tangential complaints about care   General Comments       Exercises     Shoulder Instructions      Home Living Family/patient expects to be discharged to:: Private residence Living Arrangements: Spouse/significant other(wife) Available Help at Discharge: Family Type of Home: House Home Access: Stairs to enter Technical brewer of Steps: 6 Entrance Stairs-Rails: Right;Left Home Layout: One level     Bathroom Shower/Tub: (reports he sponge bathes)          Home Equipment: Walker - 2 wheels;Cane - single point;Bedside commode;Other (comment)          Prior Functioning/Environment Level of Independence: Needs assistance  Gait / Transfers Assistance Needed: walks ~60 ft from bedroom to kitchen with no assistive device. walks to bathroom ~ 15'; pt does not use O2 when he goes to kitchen ADL's / Homemaking Assistance Needed: prepares simple meals, sponge bathes, toilets/dresses in, electric shaver, does not wear socks at home            OT Problem List: Decreased strength;Decreased activity tolerance;Decreased knowledge of use of DME or AE;Cardiopulmonary status limiting activity;Impaired balance (sitting and/or standing);Decreased safety awareness;Decreased cognition      OT Treatment/Interventions: Self-care/ADL training;DME and/or AE instruction;Therapeutic activities;Balance training;Therapeutic exercise;Patient/family education;Energy conservation    OT Goals(Current goals can be found in the care plan section) Acute Rehab OT Goals Patient Stated Goal: to take a bath OT Goal Formulation: With patient Time For Goal Achievement: 03/17/19 Potential to Achieve Goals: Good ADL Goals Pt Will Perform Grooming: with min guard assist;standing Pt Will Perform Upper Body Bathing: with min assist;sitting Pt Will Perform Lower Body Bathing: with min assist;sit to/from stand;sitting/lateral leans Pt Will Perform Upper Body Dressing: with set-up;sitting Pt Will  Perform Lower Body Dressing: with min assist;sitting/lateral leans;sit to/from stand Pt Will Transfer to Toilet: with modified independence;bedside commode;ambulating;regular height toilet Additional ADL Goal #1: Pt will recall and/or apply 1-3 ECS strategies while engaging in BADL task Additional ADL Goal #2: Pt will complete sponge bath per baseline at min A level while seated  OT Frequency: Min 2X/week   Barriers to D/C:            Co-evaluation              AM-PAC OT "6  Clicks" Daily Activity     Outcome Measure Help from another person eating meals?: A Little Help from another person taking care of personal grooming?: None Help from another person toileting, which includes using toliet, bedpan, or urinal?: A Lot(for hygiene) Help from another person bathing (including washing, rinsing, drying)?: Total Help from another person to put on and taking off regular upper body clothing?: A Little Help from another person to put on and taking off regular lower body clothing?: Total 6 Click Score: 14   End of Session Nurse Communication: Mobility status(pt requesting OT relay info to charge RN for complaint)  Activity Tolerance: Patient tolerated treatment well Patient left: in bed;with call bell/phone within reach  OT Visit Diagnosis: Other abnormalities of gait and mobility (R26.89);Unsteadiness on feet (R26.81);Muscle weakness (generalized) (M62.81)                Time: 1030-1150 OT Time Calculation (min): 80 min Charges:  OT General Charges $OT Visit: 1 Visit OT Evaluation $OT Eval Low Complexity: 1 Low OT Treatments $Self Care/Home Management : 53-67 mins  Zenovia Jarred, MSOT, OTR/L Behavioral Health OT/ Acute Relief OT [facility] Office: [phone]  Zenovia Jarred 03/03/2019, 1:36 PM

## 2019-03-03 NOTE — Progress Notes (Signed)
PROGRESS NOTE    SALLIE MAKER  ZYS:[id number] DOB: [birth year] DOA: 02/28/2019 PCP: [name], MD    Brief Narrative:  Per Dr Wonda Horner is a 78 y.o. male with medical history significant of chronic respiratory failure, COPD, O2 dependent ? 7 L at home, AAA (S/p endovascular repair), AVMs, anxiety, bipolar1, MI, CAD, CABG, dCHF, pression/anxiety, DM II,HTN, GERD, HLD, chronic microcytic anemia.  Presenting with worsening shortness of breath, cough over past month.  Today on 3 L he was satting in low 90s. Currently denied of having any fever or chills.  Reportedly he supposed to be on 7 L of oxygen at home, stating that he has been toled to cut it down to 3-4 L, but he desatted over past few days.  He reports a poor memory, lives with his wife who is her 74s, currently taking care of him at home.  Denies of having a fever, chills, nausea, vomiting.  Denies of having any chest pain.  Only shortness of breath. Does have any headaches visual change or asymmetric weaknesses.  Denies of having any abdominal pain constipation or diarrhea.  Denies of having any dysuria.  Denies of having any joint pain rash or open wound.    ED Course:  Patient was evaluated in ED, patient was audibly wheezing with increased respiratory efforts, but he maintained his O2 sat at 94% on 3- 4 L of oxygen, tachycardic at 102, temp 99.6, WBC 8.3, lactic acid 1.3, BNP 112, Chest x-ray SARS-CoV-2 target nucleic acids are NOT DETECTED.  She received breathing treatment, IV Solu-Medrol, was asked for the patient to be admitted due to increasing respiratory effort, for chronic respiratory failure, bronchitis.    Assessment & Plan:   Principal Problem:   Acute on chronic respiratory failure with hypoxia (HCC) Active Problems:   Hyperlipidemia   Essential hypertension   Chronic diastolic CHF (congestive heart failure) (HCC)   CAD (coronary artery disease)   AAA (abdominal aortic aneurysm)  (HCC)   AVM (arteriovenous malformation) of colon   Chronic pain syndrome   Iron deficiency anemia due to chronic blood loss   Major depressive disorder, recurrent, severe without psychotic features ([place])   Diabetes mellitus with complication (HCC)   COPD exacerbation (HCC)  1 acute on chronic respiratory failure with hypoxia secondary to acute COPD exacerbation/bronchitis Patient admitted with worsening shortness of breath, cough over the past month, increased O2 requirements from his baseline and noted to have diffuse wheezing on examination.  Chest x-ray done negative for any acute infiltrates.-SARS-CoV-2 target nucleic acids are NOT DETECTED.  Patient slowly improving clinically however not at baseline.  IV Solu-Medrol has been transitioned to oral prednisone taper.  Continue azithromycin. Continue Pulmicort, Mucinex, PPI, Claritin, Flonase, Atrovent nebs.  Continue Xopenex nebs.  Continue home regimen of oral Lasix.  Follow.  Outpatient follow-up with pulmonary on discharge.  2.  Diabetes mellitus type 2 with neuropathy Hemoglobin A1c of 9.0.  Patient noted to have elevated CBGs of 400-500s on 03/01/2019.  CBG of 353 this morning.  Elevated CBGs likely secondary dietary indiscretions and concomitant IV steroids.  Patient states did not use his insulin a few days prior to admission.  Patient states he is on Lantus 50 units twice daily as well as NovoLog 20 units meal coverage.  However patient was started on Lantus 25 units twice daily on admission.  Increased Lantus back to home regimen of 50 units twice daily.  Patient currently meal coverage of NovoLog 20 units  3 times daily.  Increase Lantus to 60 units twice daily.  Increase NovoLog meal coverage to 24 units 3 times daily with meals.  IV steroids has been transitioned to oral prednisone taper.  Continue Neurontin 600 mg 3 times daily.  Diabetic coordinator following.   3.  Hyperlipidemia Fasting lipid panel with LDL of 122.  Patient with  history of coronary artery disease.  Patient started on a statin.  Outpatient follow-up.  4.  Chronic diastolic CHF/coronary artery disease with history of CABG/MIs Currently stable.  2D echo from 10/05/2018 reviewed, ejection fraction of 60 to 65%, no LVH, no report of systolic or diastolic failure by however history reveals diastolic CHF.  Currently seems euvolemic on examination.  Patient with a urine output of 1.700 L over the past 24 hours.  Continue home regimen of Lasix 60 mg daily.  Patient resumed on aspirin which has subsequently been discontinued due to history of AVMs. Patient's home medication list did not show that patient was on a beta-blocker or statin.  Patient started on low-dose beta-blocker and ACE inhibitor as well as statin.  Cardizem has been discontinued.  Patient will need outpatient follow-up with cardiology post discharge.   5.  History of AAA Status post repair.  Stable.  Outpatient follow-up.  6.  History of AVMs of the colon Continue PPI.  D/C aspirin.   7.  Chronic pain syndrome Continue Neurontin 600 mg p.o. 3 times daily.  PRN analgesics.  8.  Iron deficiency anemia due to chronic blood loss Hemoglobin stable at 9.6.  Continue oral iron supplementation.    9.  Bipolar disorder/MDD, POA No psychotic features noted.  Stable.  Continue home regimen Celexa and Xanax as needed.    DVT prophylaxis: Heparin. Code Status: Full Family Communication: Updated patient.  No family at bedside. Disposition Plan: Likely home when clinically improved and blood sugars improved hopefully in 24 to 48 hours..   Consultants:   None  Procedures:   Chest x-ray 02/28/2019  Midline 03/01/2019  Antimicrobials:   Azithromycin 02/28/2019>>> 03/04/2019   Subjective: Patient laying in bed.  Shortness of breath and wheezing improving however patient states not quite at baseline.  Patient with complaints of generalized weakness.  Denies any chest pain.  Tolerating diet.  Per RN  and nurse tech patient seems to always be eating asking for crackers.  Patient states he usually eats cake for breakfast.  CBG this morning at 353.   Objective: Vitals:   03/02/19 2135 03/03/19 0233 03/03/19 0631 03/03/19 0922  BP: 123/79     Pulse: 79  75   Resp: 18  19   Temp: 97.9 F (36.6 C)  98.2 F (36.8 C)   TempSrc: Oral  Axillary   SpO2: 92% 93% 92% 90%  Weight:   126.3 kg   Height:        Intake/Output Summary (Last 24 hours) at 03/03/2019 1243 Last data filed at 03/03/2019 1100 Gross per 24 hour  Intake 840 ml  Output 2300 ml  Net -1460 ml   Filed Weights   03/01/19 0500 03/02/19 0445 03/03/19 0631  Weight: 133 kg 126.2 kg 126.3 kg    Examination:  General exam: NAD Respiratory system: Fair air movement.  No expiratory wheezing noted.  No rhonchi.  Speaking in full sentences.  No crackles.  Cardiovascular system: RRR no murmurs rubs or gallops.  No JVD.  No lower extremity edema.  Gastrointestinal system: Abdomen is nontender, nondistended, soft, obese, positive bowel sounds.  No rebound.  No guarding.    Central nervous system: Alert and oriented. No focal neurological deficits. Extremities: Symmetric 5 x 5 power. Skin: No rashes, lesions or ulcers Psychiatry: Judgement and insight appear normal. Mood & affect appropriate.     Data Reviewed: I have personally reviewed following labs and imaging studies  CBC: Recent Labs  Lab 02/28/19 0512 03/01/19 1335 03/02/19 0318 03/03/19 0341  WBC 8.3 12.5* 12.5* 10.5  NEUTROABS 6.0 10.9*  --   --   HGB 9.4* 9.5* 8.9* 9.6*  HCT 32.3* 30.6* 29.0* 31.6*  MCV 98.5 94.4 96.7 96.3  PLT 186 244 236 403   Basic Metabolic Panel: Recent Labs  Lab 02/28/19 0512 02/28/19 0918 02/28/19 1706 03/01/19 1335 03/02/19 0318 03/03/19 0341  NA 136  --   --  133* 131* 133*  K 4.9  --   --  4.0 4.2 4.4  CL 96*  --   --  90* 89* 86*  CO2 35*  --   --  30 29 32  GLUCOSE 301*  --  549* 383* 421* 396*  BUN 22  --   --  41*  49* 51*  CREATININE 1.09  --   --  1.39* 1.49* 1.39*  CALCIUM 8.1*  --   --  8.4* 8.0* 8.4*  MG  --  2.2  --   --   --   --   PHOS  --  1.4*  --  1.7* 3.6 4.1   GFR: Estimated Creatinine Clearance: 63.7 mL/min (A) (by C-G formula based on SCr of 1.39 mg/dL (H)). Liver Function Tests: Recent Labs  Lab 02/28/19 0512  AST 14*  ALT 15  ALKPHOS 122  BILITOT 0.3  PROT 6.3*  ALBUMIN 3.1*   No results for input(s): LIPASE, AMYLASE in the last 168 hours. No results for input(s): AMMONIA in the last 168 hours. Coagulation Profile: No results for input(s): INR, PROTIME in the last 168 hours. Cardiac Enzymes: Recent Labs  Lab 02/28/19 0512  TROPONINI <0.03   BNP (last 3 results) No results for input(s): PROBNP in the last 8760 hours. HbA1C: No results for input(s): HGBA1C in the last 72 hours. CBG: Recent Labs  Lab 03/02/19 1634 03/02/19 2133 03/02/19 2359 03/03/19 0718 03/03/19 1230  GLUCAP 316* 426* 412* 353* 348*   Lipid Profile: No results for input(s): CHOL, HDL, LDLCALC, TRIG, CHOLHDL, LDLDIRECT in the last 72 hours. Thyroid Function Tests: No results for input(s): TSH, T4TOTAL, FREET4, T3FREE, THYROIDAB in the last 72 hours. Anemia Panel: No results for input(s): VITAMINB12, FOLATE, FERRITIN, TIBC, IRON, RETICCTPCT in the last 72 hours. Sepsis Labs: Recent Labs  Lab 02/28/19 0917 02/28/19 0918 02/28/19 1212  PROCALCITON  --  0.15  --   LATICACIDVEN 1.3  --  2.1*    Recent Results (from the past 240 hour(s))  SARS Coronavirus 2 [facility] order, Performed in [place] hospital lab)     Status: None   Collection Time: 02/28/19  5:56 AM  Result Value Ref Range Status   SARS Coronavirus 2 NEGATIVE NEGATIVE Final    Comment: (NOTE) If result is NEGATIVE SARS-CoV-2 target nucleic acids are NOT DETECTED. The SARS-CoV-2 RNA is generally detectable in upper and lower  respiratory specimens during the acute phase of infection. The lowest  concentration of  SARS-CoV-2 viral copies this assay can detect is 250  copies / mL. A negative result does not preclude SARS-CoV-2 infection  and should not be used as the sole basis  for treatment or other  patient management decisions.  A negative result may occur with  improper specimen collection / handling, submission of specimen other  than nasopharyngeal swab, presence of viral mutation(s) within the  areas targeted by this assay, and inadequate number of viral copies  (<250 copies / mL). A negative result must be combined with clinical  observations, patient history, and epidemiological information. If result is POSITIVE SARS-CoV-2 target nucleic acids are DETECTED. The SARS-CoV-2 RNA is generally detectable in upper and lower  respiratory specimens dur ing the acute phase of infection.  Positive  results are indicative of active infection with SARS-CoV-2.  Clinical  correlation with patient history and other diagnostic information is  necessary to determine patient infection status.  Positive results do  not rule out bacterial infection or co-infection with other viruses. If result is PRESUMPTIVE POSTIVE SARS-CoV-2 nucleic acids MAY BE PRESENT.   A presumptive positive result was obtained on the submitted specimen  and confirmed on repeat testing.  While 2019 novel coronavirus  (SARS-CoV-2) nucleic acids may be present in the submitted sample  additional confirmatory testing may be necessary for epidemiological  and / or clinical management purposes  to differentiate between  SARS-CoV-2 and other Sarbecovirus currently known to infect humans.  If clinically indicated additional testing with an alternate test  methodology [id number]) is advised. The SARS-CoV-2 RNA is generally  detectable in upper and lower respiratory sp ecimens during the acute  phase of infection. The expected result is Negative. Fact Sheet for Patients:  StrictlyIdeas.no Fact Sheet for Healthcare  Providers: BankingDealers.co.za This test is not yet approved or cleared by the [place] FDA and has been authorized for detection and/or diagnosis of SARS-CoV-2 by FDA under an Emergency Use Authorization (EUA).  This EUA will remain in effect (meaning this test can be used) for the duration of the COVID-19 declaration under Section 564(b)(1) of the Act, 21 U.S.C. section 360bbb-3(b)(1), unless the authorization is terminated or revoked sooner. Performed at [facility], [place] [address], [place], [place] [address]          Radiology Studies: No results found.      Scheduled Meds:  atorvastatin  10 mg Oral q1800   azithromycin  500 mg Oral Daily   budesonide (PULMICORT) nebulizer solution  0.5 mg Nebulization BID   citalopram  20 mg Oral Daily   docusate sodium  100 mg Oral BID   ferrous sulfate  325 mg Oral TID WC   fluticasone  2 spray Each Nare Daily   furosemide  60 mg Oral Daily   gabapentin  600 mg Oral TID   guaiFENesin  1,200 mg Oral BID   guaiFENesin-dextromethorphan  5 mL Oral Q6H   heparin  5,000 Units Subcutaneous Q8H   insulin aspart  0-20 Units Subcutaneous TID WC   insulin aspart  0-5 Units Subcutaneous QHS   insulin aspart  24 Units Subcutaneous TID WC   insulin glargine  60 Units Subcutaneous BID   ipratropium  0.5 mg Nebulization Q6H   levalbuterol  0.63 mg Nebulization Q6H   lisinopril  2.5 mg Oral Daily   loratadine  10 mg Oral Daily   metoprolol tartrate  12.5 mg Oral BID   pantoprazole  40 mg Oral BID   [START ON 03/04/2019] predniSONE  40 mg Oral QAC breakfast   sodium chloride flush  10-40 mL Intracatheter Q12H   Continuous Infusions:   LOS: 3 days    Time spent: 35  minutes    Irine Seal, MD [facility]  If 7PM-7AM, please contact night-coverage [email or web address] Password [facility] 03/03/2019, 12:43 PM

## 2019-03-04 LAB — BASIC METABOLIC PANEL
Anion gap: 10 (ref 5–15)
BUN: 52 mg/dL — ABNORMAL HIGH (ref 8–23)
CO2: 35 mmol/L — ABNORMAL HIGH (ref 22–32)
Calcium: 8.3 mg/dL — ABNORMAL LOW (ref 8.9–10.3)
Chloride: 90 mmol/L — ABNORMAL LOW (ref 98–111)
Creatinine, Ser: 1.45 mg/dL — ABNORMAL HIGH (ref 0.61–1.24)
GFR calc Af Amer: 53 mL/min — ABNORMAL LOW (ref >60)
GFR calc non Af Amer: 46 mL/min — ABNORMAL LOW (ref >60)
Glucose, Bld: 210 mg/dL — ABNORMAL HIGH (ref 70–99)
Potassium: 3.9 mmol/L (ref 3.5–5.1)
Sodium: 135 mmol/L (ref 135–145)

## 2019-03-04 LAB — GLUCOSE, CAPILLARY
Glucose-Capillary: 224 mg/dL — ABNORMAL HIGH (ref 70–99)
Glucose-Capillary: 346 mg/dL — ABNORMAL HIGH (ref 70–99)
Glucose-Capillary: 347 mg/dL — ABNORMAL HIGH (ref 70–99)
Glucose-Capillary: 166 mg/dL — ABNORMAL HIGH (ref 70–99)

## 2019-03-04 LAB — HEMOGLOBIN AND HEMATOCRIT, BLOOD
HCT: 33.5 % — ABNORMAL LOW (ref 39.0–52.0)
Hemoglobin: 10.2 g/dL — ABNORMAL LOW (ref 13.0–17.0)

## 2019-03-04 MED ORDER — INSULIN ASPART 100 UNIT/ML ~~LOC~~ SOLN
30.0000 [IU] | Freq: Three times a day (TID) | SUBCUTANEOUS | Status: DC
  Administered 2019-03-04: 17:00:00 30 [IU] via SUBCUTANEOUS

## 2019-03-04 MED ORDER — INSULIN GLARGINE 100 UNIT/ML ~~LOC~~ SOLN
65.0000 [IU] | Freq: Two times a day (BID) | SUBCUTANEOUS | Status: DC
  Administered 2019-03-04: 22:00:00 65 [IU] via SUBCUTANEOUS
  Filled 2019-03-04 (×2): qty 0.65

## 2019-03-04 MED ORDER — PREDNISONE 20 MG PO TABS
40.0000 mg | ORAL_TABLET | Freq: Every day | ORAL | Status: DC
  Administered 2019-03-05: 40 mg via ORAL
  Filled 2019-03-04: qty 2

## 2019-03-04 NOTE — Progress Notes (Signed)
Inpatient Diabetes Program Recommendations  AACE/ADA: New Consensus Statement on Inpatient Glycemic Control (2015)  Target Ranges:  Prepandial:   less than 140 mg/dL      Peak postprandial:   less than 180 mg/dL (1-2 hours)      Critically ill patients:  140 - 180 mg/dL   Results for [name], [name] (MRN [id number]) as of 03/04/2019 13:33  Ref. Range 03/03/2019 07:18 03/03/2019 12:30 03/03/2019 16:17 03/03/2019 19:49  Glucose-Capillary Latest Ref Range: 70 - 99 mg/dL 353 (H)  20 units NOVOLOG +  60 units LANTUS given at 9am  348 (H)  39 units NOVOLOG  217 (H)  31 units NOVOLOG  384 (H)  3 units NOVOLOG +  60 units LANTUS given at 9pm   Results for [name], [name] (MRN [id number]) as of 03/04/2019 13:33  Ref. Range 03/04/2019 07:46 03/04/2019 11:13  Glucose-Capillary Latest Ref Range: 70 - 99 mg/dL 224 (H)  31 units NOVOLOG +  60 units LANTUS  346 (H)  39 units NOVOLOG      Home DM Meds: Lantus 50 units BID       Novolog 20 units TID with meals       (see MD progress notes)   Current Orders: Lantus 60 units BID      Novolog Resistant Correction Scale/ SSI (0-20 units) TID AC + HS      Novolog 24 units TID with meals      Solumedrol stopped--Last dose given at [place] on 05/16.  Got Prednisone 60 mg Daily the last 2 days/.  Prednisone reduced to 40 mg Daily today.  CBGs still quite high.     MD- Please consider the following in-hospital insulin adjustments:  1. Increase Lantus to 65 units BID  2. Increase Novolog Meal Coverage to: Novolog 30 units TID with meals  (Please add the following Hold Parameters: Hold if pt eats <50% of meal, Hold if pt NPO)      --Will follow patient during hospitalization--  Wyn Quaker RN, MSN, CDE Diabetes Coordinator Inpatient Glycemic Control Team Team Pager: [phone] (8a-5p)

## 2019-03-04 NOTE — Progress Notes (Signed)
Patient wanting more food after eating all of his lunch. This RN explained that he should partake in a higher protein diet to help keep him fuller longer. He agreed to a high protein snack at this time. It was ordered. Will continue to monitor

## 2019-03-04 NOTE — Telephone Encounter (Signed)
This encounter was created in error - please disregard.

## 2019-03-04 NOTE — Progress Notes (Signed)
PROGRESS NOTE    [name]  VQQ:[id number] DOB: 06-03-1941 DOA: 02/28/2019 PCP: [name], MD    Brief Narrative:  Per Dr Wonda Horner is a 78 y.o. male with medical history significant of chronic respiratory failure, COPD, O2 dependent ? 7 L at home, AAA (S/p endovascular repair), AVMs, anxiety, bipolar1, MI, CAD, CABG, dCHF, pression/anxiety, DM II,HTN, GERD, HLD, chronic microcytic anemia.  Presenting with worsening shortness of breath, cough over past month.  Today on 3 L he was satting in low 90s. Currently denied of having any fever or chills.  Reportedly he supposed to be on 7 L of oxygen at home, stating that he has been toled to cut it down to 3-4 L, but he desatted over past few days.  He reports a poor memory, lives with his wife who is her 29s, currently taking care of him at home.  Denies of having a fever, chills, nausea, vomiting.  Denies of having any chest pain.  Only shortness of breath. Does have any headaches visual change or asymmetric weaknesses.  Denies of having any abdominal pain constipation or diarrhea.  Denies of having any dysuria.  Denies of having any joint pain rash or open wound.    ED Course:  Patient was evaluated in ED, patient was audibly wheezing with increased respiratory efforts, but he maintained his O2 sat at 94% on 3- 4 L of oxygen, tachycardic at 102, temp 99.6, WBC 8.3, lactic acid 1.3, BNP 112, Chest x-ray SARS-CoV-2 target nucleic acids are NOT DETECTED.  She received breathing treatment, IV Solu-Medrol, was asked for the patient to be admitted due to increasing respiratory effort, for chronic respiratory failure, bronchitis.    Assessment & Plan:   Principal Problem:   Acute on chronic respiratory failure with hypoxia (HCC) Active Problems:   Hyperlipidemia   Essential hypertension   Chronic diastolic CHF (congestive heart failure) (HCC)   CAD (coronary artery disease)   AAA (abdominal aortic aneurysm)  (HCC)   AVM (arteriovenous malformation) of colon   Chronic pain syndrome   Iron deficiency anemia due to chronic blood loss   Major depressive disorder, recurrent, severe without psychotic features ([place])   Diabetes mellitus with complication (HCC)   COPD exacerbation (HCC)  1 acute on chronic respiratory failure with hypoxia secondary to acute COPD exacerbation/bronchitis Patient admitted with worsening shortness of breath, cough over the past month, increased O2 requirements from his baseline and noted to have diffuse wheezing on examination.  Chest x-ray done negative for any acute infiltrates.-SARS-CoV-2 target nucleic acids are NOT DETECTED.  Patient slowly improving clinically however not at baseline.  IV Solu-Medrol has been transitioned to oral prednisone taper.  Continue azithromycin through today. Continue Pulmicort, Mucinex, PPI, Claritin, Flonase, Atrovent nebs.  Continue Xopenex nebs.  Continue home regimen of oral Lasix.  Follow.  Outpatient follow-up with pulmonary on discharge.  2.  Diabetes mellitus type 2 with neuropathy Hemoglobin A1c of 9.0.  Patient noted to have elevated CBGs of 400-500s on 03/01/2019.  CBG of 224 this morning.  Elevated CBGs likely secondary dietary indiscretions and concomitant IV steroids.  Patient states did not use his insulin a few days prior to admission.  Patient states he is on Lantus 50 units twice daily as well as NovoLog 20 units meal coverage.  However patient was started on Lantus 25 units twice daily on admission.  Increased Lantus back to home regimen of 50 units twice daily.  Patient currently meal coverage of NovoLog  24 units 3 times daily.  Patient currently on Lantus 60 units twice daily which we will increase to 65 units twice daily.  Increase NovoLog meal coverage to 30 units 3 times daily with meals.  Patient has been transitioned to oral prednisone which has been decreased to 40 mg daily.  Continue Neurontin 600 mg 3 times daily.  Diabetic  coordinator following.  Outpatient follow-up.    3.  Hyperlipidemia Fasting lipid panel with LDL of 122.  Patient with history of coronary artery disease.  Patient started on a statin.  Outpatient follow-up.  4.  Chronic diastolic CHF/coronary artery disease with history of CABG/MIs Currently stable.  2D echo from 10/05/2018 reviewed, ejection fraction of 60 to 65%, no LVH, no report of systolic or diastolic failure by however history reveals diastolic CHF.  Currently seems euvolemic on examination.  Patient with a urine output of 3.280 L over the past 24 hours.  Continue home regimen of Lasix 60 mg daily.  Patient resumed on aspirin which has subsequently been discontinued due to history of AVMs. Patient's home medication list did not show that patient was on a beta-blocker or statin.  Patient started on low-dose beta-blocker and ACE inhibitor as well as statin.  Cardizem has been discontinued.  Patient will need outpatient follow-up with cardiology post discharge.   5.  History of AAA Status post repair.  Stable.  Outpatient follow-up.  6.  History of AVMs of the colon Stable.  PPI.  Aspirin has been discontinued.    7.  Chronic pain syndrome Continue Neurontin 600 mg p.o. 3 times daily.  PRN analgesics.  8.  Iron deficiency anemia due to chronic blood loss Hemoglobin stable at 10.2.  Continue oral iron supplementation.    9.  Bipolar disorder/MDD, POA Stable.  No psychotic features noted.  Continue home regimen Celexa and Xanax as needed.    DVT prophylaxis: Heparin. Code Status: Full Family Communication: Updated patient.  No family at bedside. Disposition Plan: Likely home when clinically improved and blood sugars improved hopefully in 24 hours..   Consultants:   None  Procedures:   Chest x-ray 02/28/2019  Midline 03/01/2019  Antimicrobials:   Azithromycin 02/28/2019>>> 03/04/2019   Subjective: Patient laying in bed.  Complaining of generalized weakness.  Feels  shortness of breath improving however feels he is not at his baseline yet and getting close.  Denies any chest pain.  Tolerating diet.  CBG this morning of 224.    Objective: Vitals:   03/03/19 1954 03/04/19 0245 03/04/19 0433 03/04/19 0810  BP:   (!) 150/90   Pulse:   88   Resp:   18   Temp:   97.7 F (36.5 C)   TempSrc:   Oral   SpO2: 93% 95% 97% 95%  Weight:   122.8 kg   Height:        Intake/Output Summary (Last 24 hours) at 03/04/2019 1200 Last data filed at 03/04/2019 1027 Gross per 24 hour  Intake 600 ml  Output 3880 ml  Net -3280 ml   Filed Weights   03/02/19 0445 03/03/19 0631 03/04/19 0433  Weight: 126.2 kg 126.3 kg 122.8 kg    Examination:  General exam: NAD Respiratory system: Fair air movement.  No expiratory wheezes.  No rhonchi.  No crackles.  Speaking in full sentences.   Cardiovascular system: Regular rate rhythm no murmurs rubs or gallops.  No JVD.  No lower extremity edema.  Gastrointestinal system: Abdomen is soft, nontender, nondistended, positive bowel  sounds.  No rebound.  No guarding.  Central nervous system: Alert and oriented. No focal neurological deficits. Extremities: Symmetric 5 x 5 power. Skin: No rashes, lesions or ulcers Psychiatry: Judgement and insight appear normal. Mood & affect appropriate.     Data Reviewed: I have personally reviewed following labs and imaging studies  CBC: Recent Labs  Lab 02/28/19 0512 03/01/19 1335 03/02/19 0318 03/03/19 0341 03/04/19 0434  WBC 8.3 12.5* 12.5* 10.5  --   NEUTROABS 6.0 10.9*  --   --   --   HGB 9.4* 9.5* 8.9* 9.6* 10.2*  HCT 32.3* 30.6* 29.0* 31.6* 33.5*  MCV 98.5 94.4 96.7 96.3  --   PLT 186 244 236 262  --    Basic Metabolic Panel: Recent Labs  Lab 02/28/19 0512 02/28/19 0918 02/28/19 1706 03/01/19 1335 03/02/19 0318 03/03/19 0341 03/04/19 0434  NA 136  --   --  133* 131* 133* 135  K 4.9  --   --  4.0 4.2 4.4 3.9  CL 96*  --   --  90* 89* 86* 90*  CO2 35*  --   --  30 29  32 35*  GLUCOSE 301*  --  549* 383* 421* 396* 210*  BUN 22  --   --  41* 49* 51* 52*  CREATININE 1.09  --   --  1.39* 1.49* 1.39* 1.45*  CALCIUM 8.1*  --   --  8.4* 8.0* 8.4* 8.3*  MG  --  2.2  --   --   --   --   --   PHOS  --  1.4*  --  1.7* 3.6 4.1  --    GFR: Estimated Creatinine Clearance: 60.2 mL/min (A) (by C-G formula based on SCr of 1.45 mg/dL (H)). Liver Function Tests: Recent Labs  Lab 02/28/19 0512  AST 14*  ALT 15  ALKPHOS 122  BILITOT 0.3  PROT 6.3*  ALBUMIN 3.1*   No results for input(s): LIPASE, AMYLASE in the last 168 hours. No results for input(s): AMMONIA in the last 168 hours. Coagulation Profile: No results for input(s): INR, PROTIME in the last 168 hours. Cardiac Enzymes: Recent Labs  Lab 02/28/19 0512  TROPONINI <0.03   BNP (last 3 results) No results for input(s): PROBNP in the last 8760 hours. HbA1C: No results for input(s): HGBA1C in the last 72 hours. CBG: Recent Labs  Lab 03/03/19 1230 03/03/19 1617 03/03/19 1949 03/04/19 0746 03/04/19 1113  GLUCAP 348* 217* 384* 224* 346*   Lipid Profile: No results for input(s): CHOL, HDL, LDLCALC, TRIG, CHOLHDL, LDLDIRECT in the last 72 hours. Thyroid Function Tests: No results for input(s): TSH, T4TOTAL, FREET4, T3FREE, THYROIDAB in the last 72 hours. Anemia Panel: No results for input(s): VITAMINB12, FOLATE, FERRITIN, TIBC, IRON, RETICCTPCT in the last 72 hours. Sepsis Labs: Recent Labs  Lab 02/28/19 0917 02/28/19 0918 02/28/19 1212  PROCALCITON  --  0.15  --   LATICACIDVEN 1.3  --  2.1*    Recent Results (from the past 240 hour(s))  SARS Coronavirus 2 [facility] order, Performed in [facility] hospital lab)     Status: None   Collection Time: 02/28/19  5:56 AM  Result Value Ref Range Status   SARS Coronavirus 2 NEGATIVE NEGATIVE Final    Comment: (NOTE) If result is NEGATIVE SARS-CoV-2 target nucleic acids are NOT DETECTED. The SARS-CoV-2 RNA is generally detectable in upper and  lower  respiratory specimens during the acute phase of infection. The lowest  concentration  of SARS-CoV-2 viral copies this assay can detect is 250  copies / mL. A negative result does not preclude SARS-CoV-2 infection  and should not be used as the sole basis for treatment or other  patient management decisions.  A negative result may occur with  improper specimen collection / handling, submission of specimen other  than nasopharyngeal swab, presence of viral mutation(s) within the  areas targeted by this assay, and inadequate number of viral copies  (<250 copies / mL). A negative result must be combined with clinical  observations, patient history, and epidemiological information. If result is POSITIVE SARS-CoV-2 target nucleic acids are DETECTED. The SARS-CoV-2 RNA is generally detectable in upper and lower  respiratory specimens dur ing the acute phase of infection.  Positive  results are indicative of active infection with SARS-CoV-2.  Clinical  correlation with patient history and other diagnostic information is  necessary to determine patient infection status.  Positive results do  not rule out bacterial infection or co-infection with other viruses. If result is PRESUMPTIVE POSTIVE SARS-CoV-2 nucleic acids MAY BE PRESENT.   A presumptive positive result was obtained on the submitted specimen  and confirmed on repeat testing.  While 2019 novel coronavirus  (SARS-CoV-2) nucleic acids may be present in the submitted sample  additional confirmatory testing may be necessary for epidemiological  and / or clinical management purposes  to differentiate between  SARS-CoV-2 and other Sarbecovirus currently known to infect humans.  If clinically indicated additional testing with an alternate test  methodology [phone]) is advised. The SARS-CoV-2 RNA is generally  detectable in upper and lower respiratory sp ecimens during the acute  phase of infection. The expected result is Negative.  Fact Sheet for Patients:  StrictlyIdeas.no Fact Sheet for Healthcare Providers: BankingDealers.co.za This test is not yet approved or cleared by the [place] FDA and has been authorized for detection and/or diagnosis of SARS-CoV-2 by FDA under an Emergency Use Authorization (EUA).  This EUA will remain in effect (meaning this test can be used) for the duration of the COVID-19 declaration under Section 564(b)(1) of the Act, 21 U.S.C. section 360bbb-3(b)(1), unless the authorization is terminated or revoked sooner. Performed at [facility], [place] [address], [place], [place] [address]          Radiology Studies: No results found.      Scheduled Meds: . atorvastatin  10 mg Oral q1800  . budesonide (PULMICORT) nebulizer solution  0.5 mg Nebulization BID  . citalopram  20 mg Oral Daily  . docusate sodium  100 mg Oral BID  . ferrous sulfate  325 mg Oral TID WC  . fluticasone  2 spray Each Nare Daily  . furosemide  60 mg Oral Daily  . gabapentin  600 mg Oral TID  . guaiFENesin  1,200 mg Oral BID  . guaiFENesin-dextromethorphan  5 mL Oral Q6H  . heparin  5,000 Units Subcutaneous Q8H  . insulin aspart  0-20 Units Subcutaneous TID WC  . insulin aspart  0-5 Units Subcutaneous QHS  . insulin aspart  24 Units Subcutaneous TID WC  . insulin glargine  60 Units Subcutaneous BID  . ipratropium  0.5 mg Nebulization Q6H  . levalbuterol  0.63 mg Nebulization Q6H  . lisinopril  2.5 mg Oral Daily  . loratadine  10 mg Oral Daily  . metoprolol tartrate  12.5 mg Oral BID  . pantoprazole  40 mg Oral BID  . [START ON 03/05/2019] predniSONE  40 mg Oral QAC breakfast  .  sodium chloride flush  10-40 mL Intracatheter Q12H   Continuous Infusions:   LOS: 4 days    Time spent: 35 minutes    Irine Seal, MD [facility]  If 7PM-7AM, please contact night-coverage [email or web address] Password [email or web address] 03/04/2019, 12:00  PM

## 2019-03-04 NOTE — Progress Notes (Signed)
Physical Therapy Treatment Patient Details Name: [name] MRN: [id number] DOB: Aug 16, 1941 Today's Date: 03/04/2019    History of Present Illness [name] is a 78 y.o. male adm with acute on chronic respiratory failure. medical history significant of chronic respiratory failure, COPD, O2 dependent ? 7 L at home, AAA (S/p endovascular repair), AVMs, anxiety, bipolar1, MI, CAD, CABG, dCHF, pression/anxiety, DM II,HTN, GERD, HLD, chronic microcytic anemia.     PT Comments    Pt in bed on 8 lts sats 96% required MAX encouragement to get OOB to walk then got "iritated" when I politely insisted he try.  Pt was able to self get to EOB and stand.  General Gait Details: pt tolerated a functional/household distance 75 feet  but required 8 lts nasal oxygen to achieve 93% with noted 2/4 dyspnea.  Pt stated he is limited at home walking "cause my cord can only go so far".  Pt plans to D/C back home with spouse but is worried about the 5 steps he has to get in.  If schedule will allow, I can return tomorrow morning to practice stairs.   Follow Up Recommendations  Home health PT;Supervision for mobility/OOB     Equipment Recommendations       Recommendations for Other Services       Precautions / Restrictions Precautions Precaution Comments: O2 dependent  Restrictions Weight Bearing Restrictions: No    Mobility  Bed Mobility Overal bed mobility: Modified Independent             General bed mobility comments: pt self able with increased time and use of rail  Transfers Overall transfer level: Needs assistance Equipment used: None Transfers: Sit to/from Stand;Stand Pivot Transfers Sit to Stand: Supervision Stand pivot transfers: Supervision       General transfer comment: supervision for safety otherwise pt self able   Ambulation/Gait Ambulation/Gait assistance: Supervision Gait Distance (Feet): 75 Feet Assistive device: Rolling walker (2 wheeled) Gait  Pattern/deviations: Step-through pattern Gait velocity: decreased    General Gait Details: pt tolerated a functional/household distance but required 8 lts nasal oxygen to achieve 93% with noted 2/4 dyspnea.     Stairs             Wheelchair Mobility    Modified Rankin (Stroke Patients Only)       Balance                                            Cognition Arousal/Alertness: Awake/alert Behavior During Therapy: Agitated                                   General Comments: pt slightly "iritated" for unknown reason.  Hypertalkative.  Off course conversation.       Exercises      General Comments        Pertinent Vitals/Pain Pain Assessment: Faces Faces Pain Scale: Hurts a little bit Pain Location: chronic LBP Pain Descriptors / Indicators: Aching;Constant Pain Intervention(s): Monitored during session    Home Living                      Prior Function            PT Goals (current goals can now be found in the care plan section) Progress towards  PT goals: Progressing toward goals    Frequency    Min 2X/week      PT Plan Current plan remains appropriate    Co-evaluation              AM-PAC PT "6 Clicks" Mobility   Outcome Measure  Help needed turning from your back to your side while in a flat bed without using bedrails?: None Help needed moving from lying on your back to sitting on the side of a flat bed without using bedrails?: None Help needed moving to and from a bed to a chair (including a wheelchair)?: None Help needed standing up from a chair using your arms (e.g., wheelchair or bedside chair)?: None Help needed to walk in hospital room?: A Little Help needed climbing 3-5 steps with a railing? : A Little 6 Click Score: 22    End of Session Equipment Utilized During Treatment: Gait belt Activity Tolerance: Patient tolerated treatment well Patient left: with call bell/phone within  reach;in bed   PT Visit Diagnosis: Muscle weakness (generalized) (M62.81);Difficulty in walking, not elsewhere classified (R26.2)     Time: 8403-7543 PT Time Calculation (min) (ACUTE ONLY): 15 min  Charges:  $Gait Training: 8-22 mins                     Rica Koyanagi  PTA Acute  Rehabilitation Services Pager      [phone] Office      [phone]

## 2019-03-04 NOTE — Care Management Important Message (Signed)
Important Message  Patient Details IM Letter given to Servando Snare SW to present to the Patient Name: JERMAN TINNON MRN: [id number] Date of Birth: September 12, 1941   Medicare Important Message Given:  Yes    Kerin Salen 03/04/2019, 10:23 [place] Message  Patient Details  Name: KYREN KNICK MRN: [id number] Date of Birth: [birth year]   Medicare Important Message Given:  Yes    Kerin Salen 03/04/2019, 10:23 AM

## 2019-03-05 LAB — GLUCOSE, CAPILLARY
Glucose-Capillary: 137 mg/dL — ABNORMAL HIGH (ref 70–99)
Glucose-Capillary: 140 mg/dL — ABNORMAL HIGH (ref 70–99)
Glucose-Capillary: 223 mg/dL — ABNORMAL HIGH (ref 70–99)

## 2019-03-05 MED ORDER — LORATADINE 10 MG PO TABS: 10.0000 mg | ORAL_TABLET | Freq: Every day | ORAL | 0 refills | Status: DC

## 2019-03-05 MED ORDER — FUROSEMIDE 20 MG PO TABS: 60.0000 mg | ORAL_TABLET | Freq: Every day | ORAL | 0 refills | Status: DC

## 2019-03-05 MED ORDER — DOCUSATE SODIUM 100 MG PO CAPS: 100.0000 mg | ORAL_CAPSULE | Freq: Two times a day (BID) | ORAL | 0 refills | Status: DC

## 2019-03-05 MED ORDER — INSULIN GLARGINE 100 UNIT/ML ~~LOC~~ SOLN
60.0000 [IU] | Freq: Two times a day (BID) | SUBCUTANEOUS | Status: DC
  Administered 2019-03-05: 10:00:00 60 [IU] via SUBCUTANEOUS
  Filled 2019-03-05 (×2): qty 0.6

## 2019-03-05 MED ORDER — METOPROLOL TARTRATE 25 MG PO TABS: 12.5000 mg | ORAL_TABLET | Freq: Two times a day (BID) | ORAL | 0 refills | Status: DC

## 2019-03-05 MED ORDER — PREDNISONE 20 MG PO TABS: 20.0000 mg | ORAL_TABLET | Freq: Every day | ORAL | 0 refills | Status: AC

## 2019-03-05 MED ORDER — PANTOPRAZOLE SODIUM 40 MG PO TBEC: 40.0000 mg | DELAYED_RELEASE_TABLET | Freq: Two times a day (BID) | ORAL | 0 refills | Status: AC

## 2019-03-05 MED ORDER — LISINOPRIL 2.5 MG PO TABS: 2.5000 mg | ORAL_TABLET | Freq: Every day | ORAL | 0 refills | Status: DC

## 2019-03-05 MED ORDER — ATORVASTATIN CALCIUM 10 MG PO TABS: 10.0000 mg | ORAL_TABLET | Freq: Every day | ORAL | 0 refills | Status: DC

## 2019-03-05 MED ORDER — INSULIN GLARGINE 100 UNIT/ML ~~LOC~~ SOLN: 60.0000 [IU] | Freq: Two times a day (BID) | SUBCUTANEOUS | 0 refills | Status: DC

## 2019-03-05 MED ORDER — INSULIN ASPART 100 UNIT/ML ~~LOC~~ SOLN: 25.0000 [IU] | Freq: Three times a day (TID) | SUBCUTANEOUS | 0 refills | Status: DC

## 2019-03-05 MED ORDER — INSULIN ASPART 100 UNIT/ML ~~LOC~~ SOLN
25.0000 [IU] | Freq: Three times a day (TID) | SUBCUTANEOUS | Status: DC
  Administered 2019-03-05 (×2): 25 [IU] via SUBCUTANEOUS

## 2019-03-05 MED ORDER — FLUTICASONE PROPIONATE 50 MCG/ACT NA SUSP: 2.0000 | Freq: Every day | NASAL | 0 refills | Status: AC

## 2019-03-05 NOTE — Discharge Summary (Signed)
Physician Discharge Summary  [name] UXN:[id number] DOB: [birth year] DOA: 02/28/2019  PCP: Howard Ebbs, MD  Admit date: 02/28/2019 Discharge date: 03/05/2019  Time spent: 60 minutes  Recommendations for Outpatient Follow-up:  1. Follow-up with Howard Ebbs, MD in 2 weeks or as previously scheduled.  On follow-up patient will need a basic metabolic profile done to follow-up on electrolytes and renal function.  Patient's diabetes will need to be reassessed as patient's Lantus and meal coverage insulin has been adjusted.  Patient also placed on cardiac medications and may need outpatient referral to cardiology for follow-up on his cardiac issues.   Discharge Diagnoses:  Principal Problem:   Acute on chronic respiratory failure with hypoxia (HCC) Active Problems:   Hyperlipidemia   Essential hypertension   Chronic diastolic CHF (congestive heart failure) (HCC)   CAD (coronary artery disease)   AAA (abdominal aortic aneurysm) (HCC)   AVM (arteriovenous malformation) of colon   Chronic pain syndrome   Iron deficiency anemia due to chronic blood loss   Major depressive disorder, recurrent, severe without psychotic features ([name])   Diabetes mellitus with complication ([name])   COPD exacerbation ([name])   Discharge Condition: Stable and improved.  Diet recommendation: Carb modified diet/heart healthy  Filed Weights   03/03/19 0631 03/04/19 0433 03/05/19 0500  Weight: 126.3 kg 122.8 kg 124.4 kg    History of present illness:  Per Dr [name] is a 78 y.o. male with medical history significant of chronic respiratory failure, COPD, O2 dependent ? 7 L at home, AAA (S/p endovascular repair), AVMs, anxiety, bipolar1, MI, CAD, CABG, dCHF, pression/anxiety, DM II,HTN, GERD, HLD, chronic microcytic anemia.  Presenting with worsening shortness of breath, cough over past month.  Today on 3 L he was satting in low 90s. Currently denied of having any fever or  chills.  Reportedly he supposed to be on 7 L of oxygen at home, stating that he has been toled to cut it down to 3-4 L, but he desatted over past few days.  He reports a poor memory, lives with his wife who is her 81s, currently taking care of him at home.  Denies of having a fever, chills, nausea, vomiting.  Denies of having any chest pain.  Only shortness of breath. Does have any headaches visual change or asymmetric weaknesses.  Denies of having any abdominal pain constipation or diarrhea.  Denies of having any dysuria.  Denies of having any joint pain rash or open wound.    ED Course:  Patient was evaluated in ED, patient was audibly wheezing with increased respiratory efforts, but he maintained his O2 sat at 94% on 3- 4 L of oxygen, tachycardic at 102, temp 99.6, WBC 8.3, lactic acid 1.3, BNP 112, Chest x-ray SARS-CoV-2 target nucleic acids are NOT DETECTED.  She received breathing treatment, IV Solu-Medrol, was asked for the patient to be admitted due to increasing respiratory effort, for chronic respiratory failure, bronchitis  Hospital Course:  1 acute on chronic respiratory failure with hypoxia secondary to acute COPD exacerbation/bronchitis Patient admitted with worsening shortness of breath, cough over the past month, increased O2 requirements from his baseline and noted to have diffuse wheezing on examination.  Chest x-ray done negative for any acute infiltrates.-SARS-CoV-2 target nucleic acids are NOT DETECTED.  Patient was placed on IV steroids, Pulmicort, Mucinex, PPI, Claritin, Flonase, Atrovent and Xopenex nebs as well as oral azithromycin and maintained on home regimen Lasix.  Patient improved clinically during the hospitalization.  IV steroids  were transitioned and tapered down to oral prednisone.  Patient be discharged on a prednisone taper.  Patient finished full course of antibiotics during the hospitalization and needs no further antibiotics on discharge.  Patient be  discharged in stable and improved condition.   2.  Diabetes mellitus type 2 with neuropathy Hemoglobin A1c of 9.0.  Patient noted to have elevated CBGs of 400-500s on 03/01/2019.  CBG of 224  the morning of 03/04/2019.  Elevated CBGs likely secondary dietary indiscretions and concomitant IV steroids.  Patient states did not use his insulin a few days prior to admission.  Patient stated he is on Lantus 50 units twice daily as well as NovoLog 20 units meal coverage.  However patient was started on Lantus 25 units twice daily on admission.  Increased Lantus back to home regimen of 50 units twice daily.  Patient was on meal coverage of NovoLog 24 units 3 times daily.  Patient was on Lantus 60 units twice daily which wasl increased to 65 units twice daily.  NovoLog meal coverage was increased to 30 units 3 times daily with meals.  Patient has been transitioned to oral prednisone which was being tapered.  Blood glucose levels improved such that by day of discharge morning CBG was 137.  Patient be discharged on Lantus 60 units twice daily and NovoLog 25 units 3 times daily meal coverage.  Patient also maintained on Neurontin 600 mg 3 times daily.  Patient was seen by diabetic coordinator during the hospitalization.  Outpatient follow-up with PCP.  3.  Hyperlipidemia Fasting lipid panel with LDL of 122.  Patient with history of coronary artery disease.  Patient started on a statin.  Outpatient follow-up.  4.  Chronic diastolic CHF/coronary artery disease with history of CABG/MIs Remained stable throughout the hospitalization.  2D echo from 10/05/2018 reviewed, ejection fraction of 60 to 65%, no LVH, no report of systolic or diastolic failure by however history reveals diastolic CHF.  Patient with good urine output during the hospitalization.  Patient was maintained on home regimen of Lasix 60 mg daily. Patient initially placed initially on aspirin which has subsequently been discontinued due to history of AVMs.  Patient's home medication list did not show that patient was on a beta-blocker or statin.  Patient started on low-dose beta-blocker and ACE inhibitor as well as statin.  Cardizem has been discontinued.  Patient will need outpatient follow-up with PCP and likely referral to cardiology post discharge.   5.  History of AAA Status post repair.  Outpatient follow-up.  6.  History of AVMs of the colon Patient initially placed on aspirin which was subsequently discontinued.  Patient maintained on a PPI.    7.  Chronic pain syndrome Patient was initially placed on Neurontin 300 mg 3 times daily and dose increased to Neurontin 600 mg p.o. 3 times daily.  PRN analgesics.  Outpatient follow-up.  8.  Iron deficiency anemia due to chronic blood loss Hemoglobin stable at 10.2.    Patient was maintained on oral iron supplementation.    9.  Bipolar disorder/MDD, POA Stable.  No psychotic features noted.    Patient was maintained on home regimen Celexa and Xanax as needed.    Procedures:  Chest x-ray 02/28/2019  Midline 03/01/2019   Consultations:  None  Discharge Exam: Vitals:   03/05/19 0530 03/05/19 0729  BP: 127/73   Pulse: 97   Resp: 16   Temp: (!) 97.3 F (36.3 C)   SpO2: 99% 97%    General:  NAD Cardiovascular: RRR Respiratory: CTAB  Discharge Instructions   Discharge Instructions    Ambulatory referral to Nutrition and Diabetic Education   Complete by:  As directed    HgbA1C - 9%   Diet - low sodium heart healthy   Complete by:  As directed    Diet Carb Modified   Complete by:  As directed    Increase activity slowly   Complete by:  As directed      Allergies as of 03/05/2019      Reactions   Morphine And Related Shortness Of Breath, Nausea And Vomiting, Rash, Other (See Comments)   Reaction:  Hallucinations    Penicillins Anaphylaxis, Hives, Other (See Comments)   10/02/18 - discussed with patient and he states he can tolerate penicillin capsules and had  some hives when he was 78yo and received pcn injections Has patient had a PCN reaction causing immediate rash, facial/tongue/throat swelling, SOB or lightheadedness with hypotension: Yes Has patient had a PCN reaction causing severe rash involving mucus membranes or skin necrosis: No Has patient had a PCN reaction that required hospitalization No Has patient had a PCN reaction occurring within the last 10 y   Zolpidem Shortness Of Breath   Demerol [meperidine] Other (See Comments)   Reaction:  Hallucinations     Dilaudid [hydromorphone Hcl] Other (See Comments)   Reaction:  Hallucinations    Levofloxacin Other (See Comments)   Reaction:  Unknown       Medication List    STOP taking these medications   aspirin 81 MG EC tablet   diltiazem 120 MG 24 hr capsule Commonly known as:  Cartia XT   guaiFENesin 600 MG 12 hr tablet Commonly known as:  MUCINEX   predniSONE 10 MG (21) Tbpk tablet Commonly known as:  STERAPRED UNI-PAK 21 TAB Replaced by:  predniSONE 20 MG tablet     TAKE these medications   acetaminophen 325 MG tablet Commonly known as:  TYLENOL Take 2 tablets (650 mg total) by mouth every 6 (six) hours as needed for mild pain.   albuterol 108 (90 Base) MCG/ACT inhaler Commonly known as:  VENTOLIN HFA Inhale 2 puffs into the lungs every 6 (six) hours as needed for wheezing or shortness of breath.   albuterol (2.5 MG/3ML) 0.083% nebulizer solution Commonly known as:  PROVENTIL Inhale 3 mLs (2.5 mg total) into the lungs every 2 (two) hours as needed for wheezing or shortness of breath.   ALPRAZolam 0.25 MG tablet Commonly known as:  XANAX Take 1 tablet (0.25 mg total) by mouth 3 (three) times daily as needed for anxiety or sleep. What changed:  when to take this   atorvastatin 10 MG tablet Commonly known as:  LIPITOR Take 1 tablet (10 mg total) by mouth daily at 6 PM.   citalopram 40 MG tablet Commonly known as:  CELEXA Take 0.5 tablets (20 mg total) by mouth  daily. What changed:  how much to take   docusate sodium 100 MG capsule Commonly known as:  COLACE Take 1 capsule (100 mg total) by mouth 2 (two) times daily.   ferrous sulfate 325 (65 FE) MG tablet Take 1 tablet (325 mg total) by mouth 3 (three) times daily with meals.   fluticasone 50 MCG/ACT nasal spray Commonly known as:  FLONASE Place 2 sprays into both nostrils daily. Start taking on:  Mar 06, 2019   furosemide 20 MG tablet Commonly known as:  LASIX Take 3 tablets (60 mg total) by mouth  daily. What changed:    how much to take  when to take this  reasons to take this   gabapentin 300 MG capsule Commonly known as:  NEURONTIN Take 2 capsules (600 mg total) by mouth 3 (three) times daily. What changed:    when to take this  Another medication with the same name was removed. Continue taking this medication, and follow the directions you see here.   insulin aspart 100 UNIT/ML injection Commonly known as:  novoLOG Inject 25 Units into the skin 3 (three) times daily with meals. What changed:  how much to take   insulin glargine 100 UNIT/ML injection Commonly known as:  LANTUS Inject 0.6 mLs (60 Units total) into the skin 2 (two) times daily. What changed:  how much to take   ipratropium-albuterol 0.5-2.5 (3) MG/3ML Soln Commonly known as:  DUONEB He is used 3 times daily for next 5 days, then 3 times daily as needed What changed:    how much to take  how to take this  when to take this  reasons to take this   lisinopril 2.5 MG tablet Commonly known as:  ZESTRIL Take 1 tablet (2.5 mg total) by mouth daily. Start taking on:  Mar 06, 2019   loratadine 10 MG tablet Commonly known as:  CLARITIN Take 1 tablet (10 mg total) by mouth daily. Start taking on:  Mar 06, 2019   metoprolol tartrate 25 MG tablet Commonly known as:  LOPRESSOR Take 0.5 tablets (12.5 mg total) by mouth 2 (two) times daily.   OVER THE COUNTER MEDICATION Take 2 capsules by mouth  daily as needed (allergies/sleep). [facility] brand allergy meds   pantoprazole 40 MG tablet Commonly known as:  PROTONIX Take 1 tablet (40 mg total) by mouth 2 (two) times daily.   predniSONE 20 MG tablet Commonly known as:  DELTASONE Take 1 tablet (20 mg total) by mouth daily before breakfast for 2 days. Start taking on:  Mar 06, 2019 Replaces:  predniSONE 10 MG (21) Tbpk tablet   Symbicort 160-4.5 MCG/ACT inhaler Generic drug:  budesonide-formoterol Inhale 2 puffs into the lungs 2 (two) times daily.   tiotropium 18 MCG inhalation capsule Commonly known as:  SPIRIVA Place 1 capsule (18 mcg total) into inhaler and inhale daily as needed (shortness of breath).      Allergies  Allergen Reactions  . Morphine And Related Shortness Of Breath, Nausea And Vomiting, Rash and Other (See Comments)    Reaction:  Hallucinations   . Penicillins Anaphylaxis, Hives and Other (See Comments)    10/02/18 - discussed with patient and he states he can tolerate penicillin capsules and had some hives when he was 78yo and received pcn injections  Has patient had a PCN reaction causing immediate rash, facial/tongue/throat swelling, SOB or lightheadedness with hypotension: Yes Has patient had a PCN reaction causing severe rash involving mucus membranes or skin necrosis: No Has patient had a PCN reaction that required hospitalization No Has patient had a PCN reaction occurring within the last 10 y  . Zolpidem Shortness Of Breath  . Demerol [Meperidine] Other (See Comments)    Reaction:  Hallucinations    . Dilaudid [Hydromorphone Hcl] Other (See Comments)    Reaction:  Hallucinations   . Levofloxacin Other (See Comments)    Reaction:  Unknown    Follow-up Information    Howard Ebbs, MD Follow up in 2 week(s).   Specialty:  Internal Medicine Why:  f/u in 2 weeks or as previously  scheduled. Contact information: [address] [place] [address] [phone]            The results of  significant diagnostics from this hospitalization (including imaging, microbiology, ancillary and laboratory) are listed below for reference.    Significant Diagnostic Studies: Dg Chest Port 1 View  Result Date: 02/28/2019 CLINICAL DATA:  78 year old male with shortness of breath cough and fever for 48 hours. COVID-19 status pending. EXAM: PORTABLE CHEST 1 VIEW COMPARISON:  12/26/2018 and earlier. FINDINGS: Portable AP semi upright view at 0500 hours. Prior CABG. Stable mediastinal contours, with moderate size gastric hiatal hernia demonstrated by CT in December. 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 Stable lung volumes. Increased pulmonary interstitial opacity and/or vascularity since March. No pneumothorax or new confluent pulmonary opacity. Paucity bowel gas in the upper abdomen. IMPRESSION: 1. Increased pulmonary interstitial opacity or vascularity since March. Consider interstitial edema versus viral/atypical respiratory infection. 2. 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 hiatal hernia. Electronically Signed   By: Genevie Ann M.D.   On: 02/28/2019 05:55   [place] Ekg Site Rite  Result Date: 03/01/2019 If Site Rite image not attached, placement could not be confirmed due to current cardiac rhythm.   Microbiology: Recent Results (from the past 240 hour(s))  SARS Coronavirus 2 [facility] order, Performed in [facility] hospital lab)     Status: None   Collection Time: 02/28/19  5:56 AM  Result Value Ref Range Status   SARS Coronavirus 2 NEGATIVE NEGATIVE Final    Comment: (NOTE) If result is NEGATIVE SARS-CoV-2 target nucleic acids are NOT DETECTED. The SARS-CoV-2 RNA is generally detectable in upper and lower  respiratory specimens during the acute phase of infection. The lowest  concentration of SARS-CoV-2 viral copies this assay can detect is 250  copies / mL. A  negative result does not preclude SARS-CoV-2 infection  and should not be used as the sole basis for treatment or other  patient management decisions.  A negative result may occur with  improper specimen collection / handling, submission of specimen other  than nasopharyngeal swab, presence of viral mutation(s) within the  areas targeted by this assay, and inadequate number of viral copies  (<250 copies / mL). A negative result must be combined with clinical  observations, patient history, and epidemiological information. If result is POSITIVE SARS-CoV-2 target nucleic acids are DETECTED. The SARS-CoV-2 RNA is generally detectable in upper and lower  respiratory specimens dur ing the acute phase of infection.  Positive  results are indicative of active infection with SARS-CoV-2.  Clinical  correlation with patient history and other diagnostic information is  necessary to determine patient infection status.  Positive results do  not rule out bacterial infection or co-infection with other viruses. If result is PRESUMPTIVE POSTIVE SARS-CoV-2 nucleic acids MAY BE PRESENT.   A presumptive positive result was obtained on the submitted specimen  and confirmed on repeat testing.  While 2019 novel coronavirus  (SARS-CoV-2) nucleic acids may be present in the submitted sample  additional confirmatory testing may be necessary for epidemiological  and / or clinical management purposes  to differentiate between  SARS-CoV-2 and other Sarbecovirus currently known to infect humans.  If clinically indicated additional testing with an alternate test  methodology [phone]) is advised. The SARS-CoV-2 RNA is generally  detectable in upper and lower respiratory sp ecimens during the acute  phase of infection.  The expected result is Negative. Fact Sheet for Patients:  StrictlyIdeas.no Fact Sheet for Healthcare Providers: BankingDealers.co.za This test is not  yet approved or cleared by the [place] FDA and has been authorized for detection and/or diagnosis of SARS-CoV-2 by FDA under an Emergency Use Authorization (EUA).  This EUA will remain in effect (meaning this test can be used) for the duration of the COVID-19 declaration under Section 564(b)(1) of the Act, 21 U.S.C. section 360bbb-3(b)(1), unless the authorization is terminated or revoked sooner. Performed at [facility], [place] [address] [place], [place] [address]      Labs: Basic Metabolic Panel: Recent Labs  Lab 02/28/19 0512 02/28/19 0918 02/28/19 1706 03/01/19 1335 03/02/19 0318 03/03/19 0341 03/04/19 0434  NA 136  --   --  133* 131* 133* 135  K 4.9  --   --  4.0 4.2 4.4 3.9  CL 96*  --   --  90* 89* 86* 90*  CO2 35*  --   --  30 29 32 35*  GLUCOSE 301*  --  549* 383* 421* 396* 210*  BUN 22  --   --  41* 49* 51* 52*  CREATININE 1.09  --   --  1.39* 1.49* 1.39* 1.45*  CALCIUM 8.1*  --   --  8.4* 8.0* 8.4* 8.3*  MG  --  2.2  --   --   --   --   --   PHOS  --  1.4*  --  1.7* 3.6 4.1  --    Liver Function Tests: Recent Labs  Lab 02/28/19 0512  AST 14*  ALT 15  ALKPHOS 122  BILITOT 0.3  PROT 6.3*  ALBUMIN 3.1*   No results for input(s): LIPASE, AMYLASE in the last 168 hours. No results for input(s): AMMONIA in the last 168 hours. CBC: Recent Labs  Lab 02/28/19 0512 03/01/19 1335 03/02/19 0318 03/03/19 0341 03/04/19 0434  WBC 8.3 12.5* 12.5* 10.5  --   NEUTROABS 6.0 10.9*  --   --   --   HGB 9.4* 9.5* 8.9* 9.6* 10.2*  HCT 32.3* 30.6* 29.0* 31.6* 33.5*  MCV 98.5 94.4 96.7 96.3  --   PLT 186 244 236 262  --    Cardiac Enzymes: Recent Labs  Lab 02/28/19 0512  TROPONINI <0.03   BNP: BNP (last 3 results) Recent Labs    10/11/18 2017 12/26/18 0608 02/28/19 0512  BNP 326.0* 236.6* 112.0*    ProBNP (last 3 results) No results for input(s): PROBNP in the last 8760 hours.  CBG: Recent Labs  Lab 03/04/19 1629  03/04/19 2041 03/05/19 0733 03/05/19 0917 03/05/19 1235  GLUCAP 347* 166* 137* 140* 223*       Signed:  Irine Seal MD.  [facility] 03/05/2019, 12:59 PM

## 2019-03-05 NOTE — Progress Notes (Signed)
Inpatient Diabetes Program Recommendations  AACE/ADA: New Consensus Statement on Inpatient Glycemic Control (2015)  Target Ranges:  Prepandial:   less than 140 mg/dL      Peak postprandial:   less than 180 mg/dL (1-2 hours)      Critically ill patients:  140 - 180 mg/dL   Results for HASTON, CASEBOLT (MRN [id number]) as of 03/05/2019 09:21  Ref. Range 03/04/2019 07:46 03/04/2019 11:13 03/04/2019 16:29 03/04/2019 20:41  Glucose-Capillary Latest Ref Range: 70 - 99 mg/dL 224 (H)  31 units NOVOLOG +  60 units LANTUS  346 (H)  39 units NOVOLOG  347 (H)  45 units NOVOLOG  166 (H)     65 units LANTUS    Results for BRANNDON, TUITE (MRN [id number]) as of 03/05/2019 09:21  Ref. Range 03/05/2019 07:33 03/05/2019 09:17  Glucose-Capillary Latest Ref Range: 70 - 99 mg/dL 137 (H) 140 (H)    Home DM Meds: Lantus 50 units BID                             Novolog 20 units TID with meals                             (see MD progress notes)   Current Orders: Lantus 60 units BID                            Novolog Resistant Correction Scale/ SSI (0-20 units) TID AC + HS                            Novolog 25 units TID with meals      Solumedrol stopped--Last dose given at [facility] on 05/16.  Got Prednisone 60 mg Daily the last 2 days/.  Prednisone reduced to 40 mg Daily yesterday.     MD- Note Lantus increased to 65 units BID and Novolog Meal Coverage increased to 30 units TID last PM.  Note plans to reduce Prednisone further, therefore Insulins reduced back to the following: Lantus 60 BID Novolog Resistant Correction Scale/ SSI (0-20 units) TID AC + HS Novolog 25 units TID with meals  Agree with reductions since steroids are continuing to be tapered.     --Will follow patient during hospitalization--  Wyn Quaker RN, MSN, CDE Diabetes Coordinator Inpatient Glycemic Control Team Team Pager: [phone] (8a-5p)

## 2019-03-05 NOTE — Progress Notes (Signed)
Nurse at bedside to offer patient meds. Nurse asked patient if he wanted his prednisone and iron pill and patient is yelling at the nurse when are you going to learn how to stop these bells the nurse proceeds to ask the patient if he is ready to take his meds and the patient yells out do you have enough sense to give it to me. This nurse walked away as patient continued to yell.

## 2019-03-05 NOTE — Progress Notes (Signed)
Nurse at the beside to administer insulin asking the patient did he receive a tray patient says did you see one nurse then attempts to explain to patient that she thought a tray came in and that's why she went to get insulin. Nurse explained that she would give oral meds and hold off on insulin until meal. Patient starts to yell at the nurse stating " you better get my tray". Nurse asked patient to lower tone and patient became more aggrivated cursing and yelling at nurse . Patient called nurse a " snippy little bitch" as nurse walks away. Patient can be heard in the hallway using profanity. Patient is requesting to see the director of nursing.

## 2019-03-05 NOTE — Progress Notes (Signed)
Spoke with case manager who verified that it is ok for the patient to be discharged and that home health has been set up. Patient made aware.

## 2019-03-05 NOTE — Progress Notes (Signed)
Lab technician at the bedside to draw labs and patient became argumentative with the lab tech. The lab tech told patient that he was here to draw labs and not argue with the patient and patient tells lab tech that "he is not here to argue with his big ass either." No labs drawn at this time.

## 2019-03-05 NOTE — Progress Notes (Signed)
Physical Therapy Treatment Patient Details Name: [name] MRN: [id number] DOB: 05/21/1941 Today's Date: 03/05/2019    History of Present Illness [name] is a 78 y.o. male adm with acute on chronic respiratory failure. medical history significant of chronic respiratory failure, COPD, O2 dependent ? 7 L at home, AAA (S/p endovascular repair), AVMs, anxiety, bipolar1, MI, CAD, CABG, dCHF, pression/anxiety, DM II,HTN, GERD, HLD, chronic microcytic anemia.     PT Comments    Pt reported he fell last night.  "I got tangled up on my cord and hit my knee".  Pt tolerated full WBing and amb distance with no c/o.  Practiced stairs.  General Gait Details: pt tolerated a functional/household distance on prior O2 level of 4 lts sats >90%..  Pt appears at prior mobility level and O2 level of 4 lts.  Pt ready for D/C to home from PT standpoint.    Follow Up Recommendations  Home health PT;Supervision for mobility/OOB     Equipment Recommendations  None recommended by PT    Recommendations for Other Services       Precautions / Restrictions Precautions Precautions: Fall Precaution Comments: O2 dependent  Restrictions Weight Bearing Restrictions: No    Mobility  Bed Mobility Overal bed mobility: Modified Independent             General bed mobility comments: pt self able with increased time and use of rail  Transfers Overall transfer level: Needs assistance Equipment used: None Transfers: Sit to/from Stand;Stand Pivot Transfers Sit to Stand: Supervision Stand pivot transfers: Supervision       General transfer comment: supervision for safety otherwise pt self able   Ambulation/Gait Ambulation/Gait assistance: Supervision Gait Distance (Feet): 60 Feet Assistive device: Rolling walker (2 wheeled) Gait Pattern/deviations: Step-through pattern Gait velocity: decreased    General Gait Details: pt tolerated a functional/household distance on prior O2 level of 4 lts  sats >90%.   Stairs Stairs: Yes Stairs assistance: Supervision;Min guard Stair Management: Two rails;Step to pattern;Forwards;Alternating pattern Number of Stairs: 5 General stair comments: pt self able holding to B rails.     Wheelchair Mobility    Modified Rankin (Stroke Patients Only)       Balance                                            Cognition Arousal/Alertness: Awake/alert Behavior During Therapy: Agitated                                   General Comments: pt slightly "iritated" for unknown reason.  Hypertalkative.  Off course conversation.       Exercises      General Comments        Pertinent Vitals/Pain Pain Assessment: Faces Faces Pain Scale: Hurts a little bit Pain Location: chronic LBP Pain Descriptors / Indicators: Aching;Constant Pain Intervention(s): Monitored during session    Home Living                      Prior Function            PT Goals (current goals can now be found in the care plan section) Progress towards PT goals: Progressing toward goals    Frequency    Min 2X/week      PT Plan  Current plan remains appropriate    Co-evaluation              AM-PAC PT "6 Clicks" Mobility   Outcome Measure  Help needed turning from your back to your side while in a flat bed without using bedrails?: None Help needed moving from lying on your back to sitting on the side of a flat bed without using bedrails?: None Help needed moving to and from a bed to a chair (including a wheelchair)?: None Help needed standing up from a chair using your arms (e.g., wheelchair or bedside chair)?: None Help needed to walk in hospital room?: None Help needed climbing 3-5 steps with a railing? : None 6 Click Score: 24    End of Session Equipment Utilized During Treatment: Gait belt Activity Tolerance: Patient tolerated treatment well Patient left: with call bell/phone within reach;in bed Nurse  Communication: (pt at prior mobility level and O2 level of 4 lts  at 90% or >) PT Visit Diagnosis: Muscle weakness (generalized) (M62.81);Difficulty in walking, not elsewhere classified (R26.2)     Time: 1105-1130 PT Time Calculation (min) (ACUTE ONLY): 25 min  Charges:  $Gait Training: 8-22 mins $Therapeutic Activity: 8-22 mins                     Rica Koyanagi  PTA Acute  Rehabilitation Services Pager      [phone] Office      [phone]

## 2019-03-05 NOTE — Progress Notes (Signed)
   03/04/19 2230  What Happened  Was fall witnessed? No  Was patient injured? No  Patient found other (Comment) (standing next to bed)  Found by Staff-comment Enid Derry NT)  Stated prior activity bathroom-unassisted (stood to use urinal at bedside)  Follow Up  MD notified Baltazar Najjar  Time MD notified 2240  Family notified No- patient refusal  Additional tests No  Progress note created (see row info) Yes  Adult Fall Risk Assessment  Risk Factor Category (scoring not indicated) Fall has occurred during this admission (document High fall risk)  Age 78  Fall History: Fall within 6 months prior to admission 0  Elimination; Bowel and/or Urine Incontinence 0  Elimination; Bowel and/or Urine Urgency/Frequency 2  Medications: includes PCA/Opiates, Anti-convulsants, Anti-hypertensives, Diuretics, Hypnotics, Laxatives, Sedatives, and Psychotropics 5  Patient Care Equipment 1  Mobility-Assistance 2  Mobility-Gait 2  Mobility-Sensory Deficit 0  Altered awareness of immediate physical environment 0  Impulsiveness 0  Lack of understanding of one's physical/cognitive limitations 0  Total Score 14  Patient Fall Risk Level High fall risk  Adult Fall Risk Interventions  Required Bundle Interventions *See Row Information* High fall risk - low, moderate, and high requirements implemented  Additional Interventions Use of appropriate toileting equipment (bedpan, BSC, etc.);Room near nurses station  Screening for Fall Injury Risk (To be completed on HIGH fall risk patients) - Assessing Need for Low Bed  Risk For Fall Injury- Low Bed Criteria None identified - Continue screening  Screening for Fall Injury Risk (To be completed on HIGH fall risk patients who do not meet crieteria for Low Bed) - Assessing Need for Floor Mats Only  Risk For Fall Injury- Criteria for Floor Mats None identified - No additional interventions needed

## 2019-03-05 NOTE — TOC Transition Note (Signed)
Transition of Care [facility]) - CM/SW Discharge Note   Patient Details  Name: [name] MRN: [id number] Date of Birth: 1941/03/19  [facility] CM/SW Contact:  Leeroy Cha, RN Phone Number: 03/05/2019, 3:34 PM   Clinical Narrative:    dcd to home with hhc see below   Final next level of care: [place] Barriers to Discharge: No Barriers Identified   Patient Goals and CMS Choice Patient states their goals for this hospitalization and ongoing recovery are:: nine stated CMS Medicare.gov Compare Post Acute Care list provided to:: Patient    Discharge Placement                       Discharge Plan and Services     Post Acute Care Choice: Home Health                    HH Arranged: RN, PT, OT, Nurse's Aide [facility] Agency: [place] ([facility]) Date [facility] Agency Contacted: 03/05/19 Time [place]: 1339 Representative spoke with at [place]: [place] (SDOH) Interventions     Readmission Risk Interventions No flowsheet data found.

## 2019-03-15 ENCOUNTER — Other Ambulatory Visit: Payer: Self-pay

## 2019-03-26 ENCOUNTER — Encounter: Payer: Self-pay | Admitting: Dietician

## 2019-03-26 ENCOUNTER — Encounter: Payer: Medicare HMO | Attending: Internal Medicine | Admitting: Dietician

## 2019-03-26 DIAGNOSIS — IMO0002 Reserved for concepts with insufficient information to code with codable children: Secondary | ICD-10-CM

## 2019-03-26 DIAGNOSIS — E1165 Type 2 diabetes mellitus with hyperglycemia: Secondary | ICD-10-CM | POA: Diagnosis present

## 2019-03-26 DIAGNOSIS — E118 Type 2 diabetes mellitus with unspecified complications: Secondary | ICD-10-CM | POA: Insufficient documentation

## 2019-03-26 NOTE — Patient Instructions (Addendum)
-   You meet all of the criteria for the FreeStyle Libre except that you or your wife would need to check your blood sugar 4 times daily for several weeks and show evidence of this.  If you are willing to do this, call one of the numbers on the FreeStyle [place] information enclosed in this mailing.  -Be sure to remember to take your insulin.  The Novolog should be taken 15 minutes before each meal.  Be as consistent as possible.  -If you have a low blood sugar (less than 70- symptoms shaky, sweaty, grumpy) treat this with 15 grams of fast acting sugar such as 1/2 cup of juice or regular soda.  Recheck your blood sugar in 15 minutes to be sure that it has risen above 70 then eat a meal or snack with protein.  -Good job on reducing salty snacks and avoiding added salt. I recommend that you choose fresh meat rather than hot dogs and other processed meat and choose fresh vegetables rather than canned.  Angie Fava are also high in salt.   Choose low sodium V-8  -When you have ice cream serving size is 1/2 cup.  -Choose beverages without sugar

## 2019-03-26 NOTE — Progress Notes (Signed)
Medical Nutrition Therapy:  Appt start time: 1030 end time:  1130. This visit was completed via telephone due to the COVID-19 pandemic.   I spoke with patient and verified that I was speaking with the correct person with two patient identifiers (full name and date of birth).   I discussed the limitations related to this kind of visit and the patient is willing to proceed. He was the only one present during this visit.  He stated that his wife did not want to be present.  HOH limited today's appointment. Location:  Patient home and myself office  Assessment:  Primary concerns today:   Patient would like more information to control her BG.   He is HOH and states that his vision is poor.   He states that he is unable to check his BG as he shakes and jerks.  His wife does this 1-2 times per week and reports a fasting BG of 129 at those times.  He would like a [employer] or other devise that does not need finger pricks.  This is now covered by medicare and he would meet criteria to get this if he/his wife were able to check his BG 4 times daily and have record of this as well as diabetes visits with his MD every 6 months.   I will send him information regarding this. He states that he only has fried foods 1-2 times per week. He states that he has reduced his salt intake with less added salt and has stopped buying chips but does eat increased hot dogs and canned vegetables and meat.  A1C 9% 02/28/19 Medications include Novolog 21 units before or after meals (differing times based on when he remembers to take this).  Forgets this at times at lunch.  Lantus 50 units bid in the am and with dinner. He states that he is able to afford his medication although states that has to change the insulin type at times when he is in the donut hole.  History includes Type 2 diabetes, GERD, HLD, HTN, CHF, COPD on home oxygen, GI bleed, bipolar and CAD.  (His oxygen does not reach from his bedroom to the kitchen and  he does not like to change to the portable option- this limits his independence with meals.)  Patient lives with his wife.  His wife does the shopping and cooking. 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  He currently has several rentals. They have horses and his 63 yo wife cares for them and gives lessons. His wife is recovering from colon cancer.  Preferred Learning Style:   No preference indicated   Learning Readiness:   Ready  Change in progress   DIETARY INTAKE:  Usual eating pattern includes 3 meals and 1 snacks per day.  24-hr recall:  B ( AM): pimento cheese on 2 slices toast OR oatmeal OR cream of wheat and bacon OR pancakes or waffles with regular syrup, 2 bacon, 2 eggs with cheese, cheese grits  Snk ( AM): none  L ( PM): tuna sandwich, pickle, pudding or grapes OR 2 hotdogs Snk ( PM):  D ( PM): seafood, spaghetti, caned vegetables OR meat loaf, mashed potatoes, canned vegetables OR grilled cheese Snk ( PM): 1 1/2 cups ice cream daily Beverages: water, v-8, diet gingerale, decaf coffee with splenda or sweetened coffeemate, regular soda 3 times per week  Usual physical activity: ADL's- home O2  Progress  Towards Goal(s):  In prgress.   Nutritional Diagnosis:  NB-1.1 Food and nutrition-related knowledge deficit As related to balance of carbohydrates, protein, fat, sodium.  As evidenced by diet hx.    Intervention:  Nutrition education regarding dietary changes to decrease processed sugar and salt.  Discussed the importance of consistent medication. Discussed BG monitoring.  PLAN: - You meet all of the criteria for the FreeStyle Libre except that you or your wife would need to check your blood sugar 4 times daily for several weeks and show evidence of this.  If you are willing to do this, call one of the numbers on the FreeStyle [place] information enclosed in this mailing.  -Be sure to remember to take your insulin.   The Novolog should be taken 15 minutes before each meal.  Be as consistent as possible.  -If you have a low blood sugar (less than 70- symptoms shaky, sweaty, grumpy) treat this with 15 grams of fast acting sugar such as 1/2 cup of juice or regular soda.  Recheck your blood sugar in 15 minutes to be sure that it has risen above 70 then eat a meal or snack with protein.  -Good job on reducing salty snacks and avoiding added salt. I recommend that you choose fresh meat rather than hot dogs and other processed meat and choose fresh vegetables rather than canned.  Angie Fava are also high in salt.   Choose low sodium V-8  -When you have ice cream serving size is 1/2 cup.  -Choose beverages without sugar   Teaching Method Utilized:  Auditory  Handouts given during visit include:  FreeStyle Libre and Medicare requirements  My plate  Barriers to learning/adherence to lifestyle change: HOH, on oxygen, poor vision  Demonstrated degree of understanding via:  Teach Back   Monitoring/Evaluation:  Dietary intake, exercise, and body weight prn.

## 2019-03-30 ENCOUNTER — Emergency Department (HOSPITAL_COMMUNITY): Payer: Medicare HMO

## 2019-03-30 ENCOUNTER — Emergency Department (HOSPITAL_COMMUNITY)
Admission: EM | Admit: 2019-03-30 | Discharge: 2019-03-30 | Disposition: A | Discharge status: Home / Self Care | Payer: Medicare HMO | Attending: Emergency Medicine | Admitting: Emergency Medicine

## 2019-03-30 DIAGNOSIS — I252 Old myocardial infarction: Secondary | ICD-10-CM | POA: Diagnosis not present

## 2019-03-30 DIAGNOSIS — I5032 Chronic diastolic (congestive) heart failure: Secondary | ICD-10-CM | POA: Insufficient documentation

## 2019-03-30 DIAGNOSIS — R630 Anorexia: Secondary | ICD-10-CM | POA: Insufficient documentation

## 2019-03-30 DIAGNOSIS — I2510 Atherosclerotic heart disease of native coronary artery without angina pectoris: Secondary | ICD-10-CM | POA: Diagnosis not present

## 2019-03-30 DIAGNOSIS — R531 Weakness: Secondary | ICD-10-CM | POA: Insufficient documentation

## 2019-03-30 DIAGNOSIS — E1122 Type 2 diabetes mellitus with diabetic chronic kidney disease: Secondary | ICD-10-CM | POA: Insufficient documentation

## 2019-03-30 DIAGNOSIS — J9612 Chronic respiratory failure with hypercapnia: Secondary | ICD-10-CM | POA: Insufficient documentation

## 2019-03-30 DIAGNOSIS — R5383 Other fatigue: Secondary | ICD-10-CM | POA: Diagnosis not present

## 2019-03-30 DIAGNOSIS — Z9981 Dependence on supplemental oxygen: Secondary | ICD-10-CM | POA: Insufficient documentation

## 2019-03-30 DIAGNOSIS — R0602 Shortness of breath: Secondary | ICD-10-CM | POA: Diagnosis present

## 2019-03-30 DIAGNOSIS — Z794 Long term (current) use of insulin: Secondary | ICD-10-CM | POA: Diagnosis not present

## 2019-03-30 DIAGNOSIS — Z87891 Personal history of nicotine dependence: Secondary | ICD-10-CM | POA: Diagnosis not present

## 2019-03-30 DIAGNOSIS — N189 Chronic kidney disease, unspecified: Secondary | ICD-10-CM | POA: Diagnosis not present

## 2019-03-30 DIAGNOSIS — Z20828 Contact with and (suspected) exposure to other viral communicable diseases: Secondary | ICD-10-CM | POA: Diagnosis not present

## 2019-03-30 DIAGNOSIS — Z79899 Other long term (current) drug therapy: Secondary | ICD-10-CM | POA: Diagnosis not present

## 2019-03-30 DIAGNOSIS — J449 Chronic obstructive pulmonary disease, unspecified: Secondary | ICD-10-CM | POA: Insufficient documentation

## 2019-03-30 DIAGNOSIS — R2243 Localized swelling, mass and lump, lower limb, bilateral: Secondary | ICD-10-CM | POA: Diagnosis not present

## 2019-03-30 DIAGNOSIS — R42 Dizziness and giddiness: Secondary | ICD-10-CM | POA: Diagnosis not present

## 2019-03-30 DIAGNOSIS — I130 Hypertensive heart and chronic kidney disease with heart failure and stage 1 through stage 4 chronic kidney disease, or unspecified chronic kidney disease: Secondary | ICD-10-CM | POA: Diagnosis not present

## 2019-03-30 LAB — BLOOD GAS, VENOUS
Acid-Base Excess: 10.9 mmol/L — ABNORMAL HIGH (ref 0.0–2.0)
Bicarbonate: 39.5 mmol/L — ABNORMAL HIGH (ref 20.0–28.0)
O2 Content: 4 L/min
O2 Saturation: 41.4 %
Patient temperature: 98.6
pCO2, Ven: 82.7 mmHg (ref 44.0–60.0)
pH, Ven: 7.301 (ref 7.250–7.430)

## 2019-03-30 LAB — CBC WITH DIFFERENTIAL/PLATELET
Abs Immature Granulocytes: 0.68 10*3/uL — ABNORMAL HIGH (ref 0.00–0.07)
Basophils Absolute: 0.1 10*3/uL (ref 0.0–0.1)
Basophils Relative: 1 %
Eosinophils Absolute: 0.2 10*3/uL (ref 0.0–0.5)
Eosinophils Relative: 2 %
HCT: 30.8 % — ABNORMAL LOW (ref 39.0–52.0)
Hemoglobin: 9.4 g/dL — ABNORMAL LOW (ref 13.0–17.0)
Immature Granulocytes: 8 %
Lymphocytes Relative: 11 %
Lymphs Abs: 0.9 10*3/uL (ref 0.7–4.0)
MCH: 29.1 pg (ref 26.0–34.0)
MCHC: 30.5 g/dL (ref 30.0–36.0)
MCV: 95.4 fL (ref 80.0–100.0)
Monocytes Absolute: 0.9 10*3/uL (ref 0.1–1.0)
Monocytes Relative: 10 %
Neutro Abs: 6.1 10*3/uL (ref 1.7–7.7)
Neutrophils Relative %: 68 %
Platelets: 238 10*3/uL (ref 150–400)
RBC: 3.23 MIL/uL — ABNORMAL LOW (ref 4.22–5.81)
RDW: 14.6 % (ref 11.5–15.5)
WBC: 8.8 10*3/uL (ref 4.0–10.5)
nRBC: 0.2 % (ref 0.0–0.2)

## 2019-03-30 LAB — COMPREHENSIVE METABOLIC PANEL
ALT: 13 U/L (ref 0–44)
AST: 17 U/L (ref 15–41)
Albumin: 3.3 g/dL — ABNORMAL LOW (ref 3.5–5.0)
Alkaline Phosphatase: 110 U/L (ref 38–126)
Anion gap: 8 (ref 5–15)
BUN: 12 mg/dL (ref 8–23)
CO2: 36 mmol/L — ABNORMAL HIGH (ref 22–32)
Calcium: 8.7 mg/dL — ABNORMAL LOW (ref 8.9–10.3)
Chloride: 91 mmol/L — ABNORMAL LOW (ref 98–111)
Creatinine, Ser: 1.06 mg/dL (ref 0.61–1.24)
GFR calc Af Amer: >60 mL/min (ref >60)
GFR calc non Af Amer: >60 mL/min (ref >60)
Glucose, Bld: 146 mg/dL — ABNORMAL HIGH (ref 70–99)
Potassium: 4.5 mmol/L (ref 3.5–5.1)
Sodium: 135 mmol/L (ref 135–145)
Total Bilirubin: 0.1 mg/dL — ABNORMAL LOW (ref 0.3–1.2)
Total Protein: 6.5 g/dL (ref 6.5–8.1)

## 2019-03-30 LAB — BRAIN NATRIURETIC PEPTIDE: B Natriuretic Peptide: 212.9 pg/mL — ABNORMAL HIGH (ref 0.0–100.0)

## 2019-03-30 LAB — D-DIMER, QUANTITATIVE: D-Dimer, Quant: 2.23 ug/mL-FEU — ABNORMAL HIGH (ref 0.00–0.50)

## 2019-03-30 LAB — SARS CORONAVIRUS 2 BY RT PCR (HOSPITAL ORDER, PERFORMED IN ~~LOC~~ HOSPITAL LAB): SARS Coronavirus 2: NEGATIVE

## 2019-03-30 LAB — TROPONIN I: Troponin I: <0.03 ng/mL (ref <0.03)

## 2019-03-30 MED ORDER — ALBUTEROL SULFATE HFA 108 (90 BASE) MCG/ACT IN AERS
6.0000 | INHALATION_SPRAY | Freq: Once | RESPIRATORY_TRACT | Status: DC
  Filled 2019-03-30: qty 6.7

## 2019-03-30 MED ORDER — IOHEXOL 350 MG/ML SOLN
100.0000 mL | Freq: Once | INTRAVENOUS | Status: AC | PRN
  Administered 2019-03-30: 100 mL via INTRAVENOUS

## 2019-03-30 MED ORDER — SODIUM CHLORIDE (PF) 0.9 % IJ SOLN
INTRAMUSCULAR | Status: AC
  Filled 2019-03-30: qty 50

## 2019-03-30 MED ORDER — IPRATROPIUM-ALBUTEROL 0.5-2.5 (3) MG/3ML IN SOLN: 3.0000 mL | Freq: Once | RESPIRATORY_TRACT | Status: DC

## 2019-03-30 MED ORDER — IPRATROPIUM BROMIDE HFA 17 MCG/ACT IN AERS
6.0000 | INHALATION_SPRAY | Freq: Once | RESPIRATORY_TRACT | Status: DC
  Filled 2019-03-30: qty 12.9

## 2019-03-30 NOTE — ED Notes (Signed)
EDP at bedside  

## 2019-03-30 NOTE — ED Notes (Signed)
Patient is very uncooperative with staff. Patient continues to try an argue with staff. This writer continues to try to give patient the best care possible.

## 2019-03-30 NOTE — Discharge Instructions (Addendum)
You were seen in the ER for shortness of breath.   Extensive work up today revealed no new abnormalities.  I suspect your symptoms are from your progressing COPD and may be there long term.   You declined breathing treatments in the ER and requested discharge. We attempted to do breathing treatments and educate you on how to use these treatments at home but you also declined this and requested discharge.   Please follow up with Dr [place] Bells as soon as possible for further discussion of your ongoing shortness of breath.  Return for fever greater than 100, worsening or new cough, light-headedness, passing out, new swelling in legs or abdomen

## 2019-03-30 NOTE — ED Notes (Signed)
Patient transported to CT 

## 2019-03-30 NOTE — ED Notes (Signed)
Bed: GU44 Expected date:  Expected time:  Means of arrival:  Comments: EMS/shob

## 2019-03-30 NOTE — ED Notes (Addendum)
Patient was given discharge teaching and paperwork. Patient verbalized understanding. Unable to take VS within 1 hour range and patient was unable to sign discharge paperwork due to being uncooperative. Patient was taken out of ED w/ oxygen tank on 6L and wheelchair . Wife was notified to call ED when she arrived to ED. Wife stated she would call so staff can help patient into vehicle and attach patient to home oxygen.

## 2019-03-30 NOTE — ED Notes (Signed)
Medication package had been opened before administration to patient. Patient refused medication. Medications were tubed to pharmacy.

## 2019-03-30 NOTE — ED Notes (Addendum)
Patient sitting on the side of bed talking to his wife on the phone.  Patient has refused all medications prescribed by PA.

## 2019-03-30 NOTE — ED Triage Notes (Signed)
Pt to ED from home via EMS. Pt recently treated for pneumonia, not currently taking antibiotics. Pt reports recent discharge from hospital and feeling worse ever since he left hospital. Pt c/o [facility] that is worse with exertion. Pt SHOB is chronic. Pt home O2 user 4/L. Currently 98% on 4L. No fever. Per EMS pt reports he has not been around anyone who is known sick.

## 2019-03-30 NOTE — ED Provider Notes (Signed)
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 progressing COPD, deconditioning.  He has not been hypoxemic on his baseline 4 L here in ER. States that he was discharged with nebulizing treatments but does not know how to use them.  He has also not f/u with Dr Lake Bells in several months. I offered RT education to show him how to use these at home.  He states that when he does receive them in the hospital they help his shortness of breath. Will give neb tx and discharge. Discussed with EDP.   1415: I was notified by RN that patient has been uncooperative in the ER.  He ripped out his IVs and declined vital signs, nebulizing treatment.  RT was also unable to provide him education.  I spoke to patient about this and he stated that he is frustrated regarding his diagnosis.  He states that his symptoms have been worsening over the course of 2 months.  He has not followed up with Dr. Lake Bells to discuss his  COPD.  Discussed at length importance of using nebulizing treatments at home, close follow-up with pulmonology.  Return precautions were given.   Final diagnoses:  Shortness of breath  Chronic respiratory failure with hypercapnia [facility])    ED Discharge Orders    None       Arlean Hopping 03/30/19 1413    Blanchie Dessert, MD 03/31/19 [phone]

## 2019-04-03 ENCOUNTER — Ambulatory Visit: Payer: Medicare HMO | Admitting: *Deleted

## 2019-04-06 ENCOUNTER — Other Ambulatory Visit: Payer: Self-pay

## 2019-04-06 ENCOUNTER — Emergency Department: Payer: Medicare HMO

## 2019-04-06 ENCOUNTER — Inpatient Hospital Stay
Admission: EM | Admit: 2019-04-06 | Discharge: 2019-04-13 | DRG: 190 | Disposition: A | Discharge status: Home Health | Payer: Medicare HMO | Attending: Specialist | Admitting: Specialist

## 2019-04-06 DIAGNOSIS — D500 Iron deficiency anemia secondary to blood loss (chronic): Secondary | ICD-10-CM | POA: Diagnosis present

## 2019-04-06 DIAGNOSIS — Z87891 Personal history of nicotine dependence: Secondary | ICD-10-CM | POA: Diagnosis not present

## 2019-04-06 DIAGNOSIS — E1140 Type 2 diabetes mellitus with diabetic neuropathy, unspecified: Secondary | ICD-10-CM | POA: Diagnosis present

## 2019-04-06 DIAGNOSIS — Z9981 Dependence on supplemental oxygen: Secondary | ICD-10-CM | POA: Diagnosis not present

## 2019-04-06 DIAGNOSIS — Z7952 Long term (current) use of systemic steroids: Secondary | ICD-10-CM

## 2019-04-06 DIAGNOSIS — J441 Chronic obstructive pulmonary disease with (acute) exacerbation: Secondary | ICD-10-CM | POA: Diagnosis present

## 2019-04-06 DIAGNOSIS — E669 Obesity, unspecified: Secondary | ICD-10-CM | POA: Diagnosis present

## 2019-04-06 DIAGNOSIS — G252 Other specified forms of tremor: Secondary | ICD-10-CM | POA: Diagnosis present

## 2019-04-06 DIAGNOSIS — I2510 Atherosclerotic heart disease of native coronary artery without angina pectoris: Secondary | ICD-10-CM | POA: Diagnosis present

## 2019-04-06 DIAGNOSIS — Z825 Family history of asthma and other chronic lower respiratory diseases: Secondary | ICD-10-CM

## 2019-04-06 DIAGNOSIS — I252 Old myocardial infarction: Secondary | ICD-10-CM

## 2019-04-06 DIAGNOSIS — Z7189 Other specified counseling: Secondary | ICD-10-CM | POA: Diagnosis not present

## 2019-04-06 DIAGNOSIS — J9801 Acute bronchospasm: Secondary | ICD-10-CM | POA: Diagnosis present

## 2019-04-06 DIAGNOSIS — J9621 Acute and chronic respiratory failure with hypoxia: Secondary | ICD-10-CM | POA: Diagnosis present

## 2019-04-06 DIAGNOSIS — Z7951 Long term (current) use of inhaled steroids: Secondary | ICD-10-CM | POA: Diagnosis not present

## 2019-04-06 DIAGNOSIS — J471 Bronchiectasis with (acute) exacerbation: Secondary | ICD-10-CM | POA: Diagnosis not present

## 2019-04-06 DIAGNOSIS — E785 Hyperlipidemia, unspecified: Secondary | ICD-10-CM | POA: Diagnosis present

## 2019-04-06 DIAGNOSIS — R0681 Apnea, not elsewhere classified: Secondary | ICD-10-CM

## 2019-04-06 DIAGNOSIS — Z880 Allergy status to penicillin: Secondary | ICD-10-CM | POA: Diagnosis not present

## 2019-04-06 DIAGNOSIS — F419 Anxiety disorder, unspecified: Secondary | ICD-10-CM | POA: Diagnosis present

## 2019-04-06 DIAGNOSIS — Z66 Do not resuscitate: Secondary | ICD-10-CM | POA: Diagnosis not present

## 2019-04-06 DIAGNOSIS — J9622 Acute and chronic respiratory failure with hypercapnia: Secondary | ICD-10-CM | POA: Diagnosis present

## 2019-04-06 DIAGNOSIS — Z833 Family history of diabetes mellitus: Secondary | ICD-10-CM

## 2019-04-06 DIAGNOSIS — F321 Major depressive disorder, single episode, moderate: Secondary | ICD-10-CM | POA: Diagnosis not present

## 2019-04-06 DIAGNOSIS — I110 Hypertensive heart disease with heart failure: Secondary | ICD-10-CM | POA: Diagnosis present

## 2019-04-06 DIAGNOSIS — J449 Chronic obstructive pulmonary disease, unspecified: Secondary | ICD-10-CM | POA: Diagnosis not present

## 2019-04-06 DIAGNOSIS — F314 Bipolar disorder, current episode depressed, severe, without psychotic features: Secondary | ICD-10-CM | POA: Diagnosis present

## 2019-04-06 DIAGNOSIS — Z6834 Body mass index (BMI) 34.0-34.9, adult: Secondary | ICD-10-CM | POA: Diagnosis not present

## 2019-04-06 DIAGNOSIS — Z8249 Family history of ischemic heart disease and other diseases of the circulatory system: Secondary | ICD-10-CM

## 2019-04-06 DIAGNOSIS — Z951 Presence of aortocoronary bypass graft: Secondary | ICD-10-CM

## 2019-04-06 DIAGNOSIS — J9811 Atelectasis: Secondary | ICD-10-CM | POA: Diagnosis present

## 2019-04-06 DIAGNOSIS — I5032 Chronic diastolic (congestive) heart failure: Secondary | ICD-10-CM | POA: Diagnosis present

## 2019-04-06 DIAGNOSIS — Z794 Long term (current) use of insulin: Secondary | ICD-10-CM

## 2019-04-06 DIAGNOSIS — Z20828 Contact with and (suspected) exposure to other viral communicable diseases: Secondary | ICD-10-CM | POA: Diagnosis present

## 2019-04-06 DIAGNOSIS — Z515 Encounter for palliative care: Secondary | ICD-10-CM

## 2019-04-06 LAB — BASIC METABOLIC PANEL
Anion gap: 9 (ref 5–15)
BUN: 25 mg/dL — ABNORMAL HIGH (ref 8–23)
CO2: 32 mmol/L (ref 22–32)
Calcium: 8.1 mg/dL — ABNORMAL LOW (ref 8.9–10.3)
Chloride: 93 mmol/L — ABNORMAL LOW (ref 98–111)
Creatinine, Ser: 1.31 mg/dL — ABNORMAL HIGH (ref 0.61–1.24)
GFR calc Af Amer: >60 mL/min (ref >60)
GFR calc non Af Amer: 52 mL/min — ABNORMAL LOW (ref >60)
Glucose, Bld: 160 mg/dL — ABNORMAL HIGH (ref 70–99)
Potassium: 5.1 mmol/L (ref 3.5–5.1)
Sodium: 134 mmol/L — ABNORMAL LOW (ref 135–145)

## 2019-04-06 LAB — GLUCOSE, CAPILLARY
Glucose-Capillary: 257 mg/dL — ABNORMAL HIGH (ref 70–99)
Glucose-Capillary: 525 mg/dL (ref 70–99)
Glucose-Capillary: 373 mg/dL — ABNORMAL HIGH (ref 70–99)

## 2019-04-06 LAB — CBC WITH DIFFERENTIAL/PLATELET
Abs Immature Granulocytes: 0.22 10*3/uL — ABNORMAL HIGH (ref 0.00–0.07)
Basophils Absolute: 0.1 10*3/uL (ref 0.0–0.1)
Basophils Relative: 1 %
Eosinophils Absolute: 0.1 10*3/uL (ref 0.0–0.5)
Eosinophils Relative: 2 %
HCT: 58.5 % — ABNORMAL HIGH (ref 39.0–52.0)
Hemoglobin: 17.5 g/dL — ABNORMAL HIGH (ref 13.0–17.0)
Immature Granulocytes: 4 %
Lymphocytes Relative: 10 %
Lymphs Abs: 0.7 10*3/uL (ref 0.7–4.0)
MCH: 27.9 pg (ref 26.0–34.0)
MCHC: 29.9 g/dL — ABNORMAL LOW (ref 30.0–36.0)
MCV: 93.3 fL (ref 80.0–100.0)
Monocytes Absolute: 0.5 10*3/uL (ref 0.1–1.0)
Monocytes Relative: 9 %
Neutro Abs: 4.7 10*3/uL (ref 1.7–7.7)
Neutrophils Relative %: 74 %
Platelets: UNDETERMINED 10*3/uL (ref 150–400)
RBC: 6.27 MIL/uL — ABNORMAL HIGH (ref 4.22–5.81)
RDW: 15.3 % (ref 11.5–15.5)
WBC: 6.3 10*3/uL (ref 4.0–10.5)
nRBC: 0 % (ref 0.0–0.2)

## 2019-04-06 LAB — SARS CORONAVIRUS 2 BY RT PCR (HOSPITAL ORDER, PERFORMED IN ~~LOC~~ HOSPITAL LAB): SARS Coronavirus 2: NEGATIVE

## 2019-04-06 LAB — TROPONIN I: Troponin I: <0.03 ng/mL (ref <0.03)

## 2019-04-06 MED ORDER — ALPRAZOLAM 0.25 MG PO TABS
0.2500 mg | ORAL_TABLET | Freq: Three times a day (TID) | ORAL | Status: DC | PRN
  Administered 2019-04-06 – 2019-04-07 (×4): 0.25 mg via ORAL
  Filled 2019-04-06 (×5): qty 1

## 2019-04-06 MED ORDER — IPRATROPIUM-ALBUTEROL 0.5-2.5 (3) MG/3ML IN SOLN
3.0000 mL | Freq: Once | RESPIRATORY_TRACT | Status: AC
  Administered 2019-04-06: 3 mL via RESPIRATORY_TRACT
  Filled 2019-04-06: qty 3

## 2019-04-06 MED ORDER — DOXYCYCLINE HYCLATE 100 MG PO TABS
100.0000 mg | ORAL_TABLET | Freq: Two times a day (BID) | ORAL | Status: DC
  Administered 2019-04-06 – 2019-04-07 (×2): 100 mg via ORAL
  Filled 2019-04-06 (×2): qty 1

## 2019-04-06 MED ORDER — LISINOPRIL 5 MG PO TABS
2.5000 mg | ORAL_TABLET | Freq: Every day | ORAL | Status: DC
  Administered 2019-04-06: 2.5 mg via ORAL
  Filled 2019-04-06: qty 1

## 2019-04-06 MED ORDER — ATORVASTATIN CALCIUM 20 MG PO TABS
10.0000 mg | ORAL_TABLET | Freq: Every day | ORAL | Status: DC
  Administered 2019-04-06 – 2019-04-12 (×7): 10 mg via ORAL
  Filled 2019-04-06 (×7): qty 1

## 2019-04-06 MED ORDER — MOMETASONE FURO-FORMOTEROL FUM 200-5 MCG/ACT IN AERO
2.0000 | INHALATION_SPRAY | Freq: Two times a day (BID) | RESPIRATORY_TRACT | Status: DC
  Administered 2019-04-06 – 2019-04-07 (×3): 2 via RESPIRATORY_TRACT
  Filled 2019-04-06: qty 8.8

## 2019-04-06 MED ORDER — INSULIN ASPART 100 UNIT/ML ~~LOC~~ SOLN
20.0000 [IU] | Freq: Once | SUBCUTANEOUS | Status: AC
  Administered 2019-04-06: 20 [IU] via SUBCUTANEOUS

## 2019-04-06 MED ORDER — INSULIN ASPART 100 UNIT/ML ~~LOC~~ SOLN
0.0000 [IU] | Freq: Three times a day (TID) | SUBCUTANEOUS | Status: DC
  Administered 2019-04-06 – 2019-04-07 (×2): 8 [IU] via SUBCUTANEOUS
  Administered 2019-04-07: 5 [IU] via SUBCUTANEOUS
  Administered 2019-04-07: 12:00:00 8 [IU] via SUBCUTANEOUS
  Administered 2019-04-08 (×2): 3 [IU] via SUBCUTANEOUS
  Administered 2019-04-09 – 2019-04-10 (×4): 2 [IU] via SUBCUTANEOUS
  Administered 2019-04-10 – 2019-04-11 (×2): 3 [IU] via SUBCUTANEOUS
  Administered 2019-04-12: 5 [IU] via SUBCUTANEOUS
  Filled 2019-04-06 (×14): qty 1

## 2019-04-06 MED ORDER — ACETAMINOPHEN 325 MG PO TABS
650.0000 mg | ORAL_TABLET | Freq: Four times a day (QID) | ORAL | Status: DC | PRN
  Administered 2019-04-07 – 2019-04-09 (×6): 650 mg via ORAL
  Filled 2019-04-06 (×6): qty 2

## 2019-04-06 MED ORDER — INSULIN ASPART 100 UNIT/ML ~~LOC~~ SOLN
0.0000 [IU] | Freq: Every day | SUBCUTANEOUS | Status: DC
  Administered 2019-04-06: 5 [IU] via SUBCUTANEOUS
  Administered 2019-04-07: 4 [IU] via SUBCUTANEOUS
  Administered 2019-04-09: 23:00:00 2 [IU] via SUBCUTANEOUS
  Filled 2019-04-06 (×3): qty 1

## 2019-04-06 MED ORDER — INSULIN ASPART 100 UNIT/ML ~~LOC~~ SOLN: 25.0000 [IU] | Freq: Three times a day (TID) | SUBCUTANEOUS | Status: DC

## 2019-04-06 MED ORDER — PANTOPRAZOLE SODIUM 40 MG PO TBEC
40.0000 mg | DELAYED_RELEASE_TABLET | Freq: Two times a day (BID) | ORAL | Status: DC
  Administered 2019-04-06 – 2019-04-13 (×15): 40 mg via ORAL
  Filled 2019-04-06 (×15): qty 1

## 2019-04-06 MED ORDER — FUROSEMIDE 40 MG PO TABS
60.0000 mg | ORAL_TABLET | Freq: Every day | ORAL | Status: DC
  Administered 2019-04-06: 60 mg via ORAL
  Filled 2019-04-06: qty 2

## 2019-04-06 MED ORDER — FLUTICASONE PROPIONATE 50 MCG/ACT NA SUSP
2.0000 | Freq: Every day | NASAL | Status: DC
  Administered 2019-04-06 – 2019-04-13 (×8): 2 via NASAL
  Filled 2019-04-06: qty 16

## 2019-04-06 MED ORDER — PREDNISONE 20 MG PO TABS
40.0000 mg | ORAL_TABLET | Freq: Every day | ORAL | Status: DC
  Administered 2019-04-06 – 2019-04-07 (×2): 40 mg via ORAL
  Filled 2019-04-06 (×2): qty 2

## 2019-04-06 MED ORDER — METOPROLOL TARTRATE 25 MG PO TABS
12.5000 mg | ORAL_TABLET | Freq: Two times a day (BID) | ORAL | Status: DC
  Administered 2019-04-06 – 2019-04-13 (×15): 12.5 mg via ORAL
  Filled 2019-04-06 (×15): qty 1

## 2019-04-06 MED ORDER — PREDNISONE 20 MG PO TABS: 60.0000 mg | ORAL_TABLET | Freq: Every day | ORAL | 0 refills | Status: DC

## 2019-04-06 MED ORDER — IPRATROPIUM-ALBUTEROL 0.5-2.5 (3) MG/3ML IN SOLN: 3.0000 mL | Freq: Three times a day (TID) | RESPIRATORY_TRACT | Status: DC | PRN

## 2019-04-06 MED ORDER — METHYLPREDNISOLONE SODIUM SUCC 125 MG IJ SOLR
125.0000 mg | Freq: Once | INTRAMUSCULAR | Status: AC
  Administered 2019-04-06: 08:00:00 125 mg via INTRAVENOUS
  Filled 2019-04-06: qty 2

## 2019-04-06 MED ORDER — INSULIN GLARGINE 100 UNIT/ML ~~LOC~~ SOLN
60.0000 [IU] | Freq: Two times a day (BID) | SUBCUTANEOUS | Status: DC
  Administered 2019-04-06 – 2019-04-13 (×15): 60 [IU] via SUBCUTANEOUS
  Filled 2019-04-06 (×17): qty 0.6

## 2019-04-06 MED ORDER — ENOXAPARIN SODIUM 40 MG/0.4ML ~~LOC~~ SOLN
40.0000 mg | SUBCUTANEOUS | Status: DC
  Administered 2019-04-06 – 2019-04-12 (×7): 40 mg via SUBCUTANEOUS
  Filled 2019-04-06 (×7): qty 0.4

## 2019-04-06 MED ORDER — IPRATROPIUM-ALBUTEROL 0.5-2.5 (3) MG/3ML IN SOLN
3.0000 mL | Freq: Once | RESPIRATORY_TRACT | Status: AC
  Administered 2019-04-06: 08:00:00 3 mL via RESPIRATORY_TRACT
  Filled 2019-04-06: qty 3

## 2019-04-06 MED ORDER — CITALOPRAM HYDROBROMIDE 20 MG PO TABS
20.0000 mg | ORAL_TABLET | Freq: Every day | ORAL | Status: DC
  Administered 2019-04-06 – 2019-04-08 (×3): 20 mg via ORAL
  Filled 2019-04-06 (×3): qty 1

## 2019-04-06 MED ORDER — PRAMIPEXOLE DIHYDROCHLORIDE 0.25 MG PO TABS
0.2500 mg | ORAL_TABLET | Freq: Every day | ORAL | Status: DC
  Administered 2019-04-06 – 2019-04-07 (×2): 0.25 mg via ORAL
  Filled 2019-04-06 (×3): qty 1

## 2019-04-06 MED ORDER — GABAPENTIN 300 MG PO CAPS
600.0000 mg | ORAL_CAPSULE | Freq: Three times a day (TID) | ORAL | Status: DC
  Administered 2019-04-06 – 2019-04-13 (×21): 600 mg via ORAL
  Filled 2019-04-06 (×21): qty 2

## 2019-04-06 MED ORDER — LORATADINE 10 MG PO TABS
10.0000 mg | ORAL_TABLET | Freq: Every day | ORAL | Status: DC
  Administered 2019-04-06 – 2019-04-13 (×8): 10 mg via ORAL
  Filled 2019-04-06 (×8): qty 1

## 2019-04-06 MED ORDER — DOXYCYCLINE HYCLATE 100 MG PO CAPS: 100.0000 mg | ORAL_CAPSULE | Freq: Two times a day (BID) | ORAL | 0 refills | Status: DC

## 2019-04-06 MED ORDER — DOXYCYCLINE HYCLATE 100 MG PO TABS
100.0000 mg | ORAL_TABLET | Freq: Once | ORAL | Status: AC
  Administered 2019-04-06: 100 mg via ORAL
  Filled 2019-04-06: qty 1

## 2019-04-06 MED ORDER — TIOTROPIUM BROMIDE MONOHYDRATE 18 MCG IN CAPS
18.0000 ug | ORAL_CAPSULE | Freq: Every day | RESPIRATORY_TRACT | Status: DC | PRN
  Filled 2019-04-06: qty 5

## 2019-04-06 MED ORDER — FERROUS SULFATE 325 (65 FE) MG PO TABS
325.0000 mg | ORAL_TABLET | Freq: Three times a day (TID) | ORAL | Status: DC
  Administered 2019-04-06 – 2019-04-13 (×21): 325 mg via ORAL
  Filled 2019-04-06 (×22): qty 1

## 2019-04-06 MED ORDER — DOCUSATE SODIUM 100 MG PO CAPS
100.0000 mg | ORAL_CAPSULE | Freq: Two times a day (BID) | ORAL | Status: DC
  Administered 2019-04-06 – 2019-04-13 (×6): 100 mg via ORAL
  Filled 2019-04-06 (×11): qty 1
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
## 2019-04-06 NOTE — ED Notes (Addendum)
Pt is very argumentative and vulgar with staff - he is screaming and demanding and refuses to listen to plan of care - advised pt that when he could stop yelling at this nurse that I would return and attempt to ambulate him per providers orders - Dr Cinda Quest notified

## 2019-04-06 NOTE — ED Notes (Addendum)
Returned to pt room with ED Tech and attempted to ambulate - pt took approximately 6 steps and desat from 99% to 93% - he then became shaky and reported that he was to weak to ambulate - he also was noted to be [facility] - pt unable to ambulate without 2 person assist - Dr Cinda Quest notified

## 2019-04-06 NOTE — ED Notes (Addendum)
Pt assisted to use urinal and required 2 people to stand - pt was assisted 2 steps to the bed and was unable to ambulate any further Pt reports that he is "not going to a [place] motel/nursing home" and that something else would be done before he let that happen  Pt refuses to wear cardiac monitor or BP cuff Pt requesting to remove IV himself - advised that he needed the IV in the event that he was admitted

## 2019-04-06 NOTE — ED Notes (Signed)
Pt given food

## 2019-04-06 NOTE — ED Notes (Signed)
Per Dr Cinda Quest to ambulate pt around 10am and document O2 sat
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
## 2019-04-06 NOTE — ED Notes (Signed)
Pt again helped to restroom with oxygen and x1 assistance.
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
## 2019-04-06 NOTE — ED Notes (Signed)
EMS VS FS 180, BP 124/68, O2 99-100 4 liters, pulse 84, R 22. Temp 97.5.

## 2019-04-06 NOTE — Discharge Instructions (Addendum)
Take antibiotics and steroids as prescribed.  Use your inhaler every 4-6 hours as needed for shortness of breath.  Return to the emergency room for fever, worsening shortness of breath, or chest pain.  [facility], RN and Nurse Tech.  [employer] for durable medical equipment.

## 2019-04-06 NOTE — ED Notes (Addendum)
Ephraim (wife) Pt wife updated on pt condition and plan of care

## 2019-04-06 NOTE — ED Provider Notes (Signed)
Patient lying in bed is satting 99%.  He gets up to walking takes 6 steps and desats to 93 and then says he is too weak to walk and sits down he will not walk any further.   Nena Polio, MD 04/06/19 1007

## 2019-04-06 NOTE — ED Triage Notes (Signed)
Patient presents to ED with complaints of respiratory distress, worsening since Monday. Was hospitalized at [facility] recently for same symptoms and tested negative for Covid 19. Is on continuous oxygen at home 6-7 liters. MD in room at this time. Also states diarrhea.

## 2019-04-06 NOTE — ED Notes (Signed)
Called and attempted to update pt spouse but got voicemail - left HIPPA compliant message for her to return call

## 2019-04-06 NOTE — ED Notes (Signed)
Unable to obtain IV access.

## 2019-04-06 NOTE — ED Notes (Signed)
Report given to Jessica RN

## 2019-04-06 NOTE — ED Notes (Signed)
Pt walked to restroom at bedside with x1 assistance and portable oxygen. Pt had 1 BM and 1 urine occurrence.

## 2019-04-06 NOTE — ED Notes (Signed)
Per MD Alfred Levins, hold duoneb for negative covid

## 2019-04-06 NOTE — ED Notes (Signed)
CBG 525. Dr Earleen Newport notified. Received verbal orders for 20 units novoLog.

## 2019-04-06 NOTE — ED Notes (Signed)
PT in room yelling "help I can't breathe". PT's oxygen sat remains above 96% with his oxygen. This rn instructed pt to take deep breaths through nose and pt became verbally aggressive stating that he had no way to call RN. RN re-informed pt of call bell on bed and pt yells that he was never told about his call bell. PT upset that a remote control for the TV has not yet been given to him. Unable to find one at this time.

## 2019-04-06 NOTE — ED Notes (Signed)
Pt assisted in using urinal but was unable to void at this time
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
## 2019-04-07 DIAGNOSIS — J9622 Acute and chronic respiratory failure with hypercapnia: Secondary | ICD-10-CM

## 2019-04-07 DIAGNOSIS — J9621 Acute and chronic respiratory failure with hypoxia: Secondary | ICD-10-CM

## 2019-04-07 DIAGNOSIS — J449 Chronic obstructive pulmonary disease, unspecified: Secondary | ICD-10-CM

## 2019-04-07 DIAGNOSIS — Z7189 Other specified counseling: Secondary | ICD-10-CM

## 2019-04-07 DIAGNOSIS — J441 Chronic obstructive pulmonary disease with (acute) exacerbation: Secondary | ICD-10-CM

## 2019-04-07 LAB — BASIC METABOLIC PANEL
Anion gap: 10 (ref 5–15)
BUN: 38 mg/dL — ABNORMAL HIGH (ref 8–23)
CO2: 34 mmol/L — ABNORMAL HIGH (ref 22–32)
Calcium: 8.4 mg/dL — ABNORMAL LOW (ref 8.9–10.3)
Chloride: 85 mmol/L — ABNORMAL LOW (ref 98–111)
Creatinine, Ser: 1.46 mg/dL — ABNORMAL HIGH (ref 0.61–1.24)
GFR calc Af Amer: 53 mL/min — ABNORMAL LOW (ref >60)
GFR calc non Af Amer: 46 mL/min — ABNORMAL LOW (ref >60)
Glucose, Bld: 288 mg/dL — ABNORMAL HIGH (ref 70–99)
Potassium: 5 mmol/L (ref 3.5–5.1)
Sodium: 129 mmol/L — ABNORMAL LOW (ref 135–145)

## 2019-04-07 LAB — CBC
HCT: 30.5 % — ABNORMAL LOW (ref 39.0–52.0)
Hemoglobin: 9.3 g/dL — ABNORMAL LOW (ref 13.0–17.0)
MCH: 28 pg (ref 26.0–34.0)
MCHC: 30.5 g/dL (ref 30.0–36.0)
MCV: 91.9 fL (ref 80.0–100.0)
Platelets: 199 10*3/uL (ref 150–400)
RBC: 3.32 MIL/uL — ABNORMAL LOW (ref 4.22–5.81)
RDW: 14.5 % (ref 11.5–15.5)
WBC: 12.4 10*3/uL — ABNORMAL HIGH (ref 4.0–10.5)
nRBC: 0 % (ref 0.0–0.2)

## 2019-04-07 LAB — GLUCOSE, CAPILLARY
Glucose-Capillary: 250 mg/dL — ABNORMAL HIGH (ref 70–99)
Glucose-Capillary: 276 mg/dL — ABNORMAL HIGH (ref 70–99)
Glucose-Capillary: 292 mg/dL — ABNORMAL HIGH (ref 70–99)
Glucose-Capillary: 337 mg/dL — ABNORMAL HIGH (ref 70–99)

## 2019-04-07 MED ORDER — BUDESONIDE 0.5 MG/2ML IN SUSP
0.5000 mg | Freq: Two times a day (BID) | RESPIRATORY_TRACT | Status: DC
  Administered 2019-04-07 – 2019-04-13 (×12): 0.5 mg via RESPIRATORY_TRACT
  Filled 2019-04-07 (×12): qty 2

## 2019-04-07 MED ORDER — IPRATROPIUM-ALBUTEROL 0.5-2.5 (3) MG/3ML IN SOLN
3.0000 mL | Freq: Four times a day (QID) | RESPIRATORY_TRACT | Status: DC
  Administered 2019-04-07 – 2019-04-13 (×24): 3 mL via RESPIRATORY_TRACT
  Filled 2019-04-07 (×24): qty 3

## 2019-04-07 MED ORDER — AZITHROMYCIN 500 MG PO TABS
250.0000 mg | ORAL_TABLET | ORAL | Status: DC
  Administered 2019-04-08 – 2019-04-12 (×3): 250 mg via ORAL
  Filled 2019-04-07 (×4): qty 1

## 2019-04-07 MED ORDER — IPRATROPIUM-ALBUTEROL 0.5-2.5 (3) MG/3ML IN SOLN: 3.0000 mL | RESPIRATORY_TRACT | Status: DC | PRN

## 2019-04-07 NOTE — Progress Notes (Signed)
[facility] [place] NAME: [name]    MR#:  [id number]  DATE OF BIRTH:  1941-02-16  SUBJECTIVE:   Chief Complaint  Patient presents with  . Respiratory Distress   Patient is seen at the bedside. He is still very SOB with minimal exertion. He has been up with PT this am. Still complaining of tremors in his bilateral upper extremities.  REVIEW OF SYSTEMS:  ROS Constitutional: Negative for chills, fever, malaise/fatigue and weight loss.  HENT: Negative for congestion, hearing loss and sore throat.   Eyes: Negative for blurred vision and double vision.  Respiratory: Positive for cough, sputum production, shortness of breath and wheezing.   Cardiovascular: Positive for orthopnea and leg swelling. Negative for chest pain and palpitations.  Gastrointestinal: Negative for abdominal pain, diarrhea, nausea and vomiting.  Genitourinary: Negative for dysuria and urgency.  Musculoskeletal: Positive for back pain. Negative for myalgias.  Skin: Negative for rash.  Neurological: Negative for dizziness, sensory change, speech change, focal weakness and headaches.  Psychiatric/Behavioral: Positive for depression. The patient is nervous/anxious and has insomnia.   DRUG ALLERGIES:   Allergies  Allergen Reactions  . Morphine And Related Shortness Of Breath, Nausea And Vomiting, Rash and Other (See Comments)    Reaction:  Hallucinations   . Penicillins Anaphylaxis, Hives and Other (See Comments)    10/02/18 - discussed with patient and he states he can tolerate penicillin capsules and had some hives when he was 78yo and received pcn injections  Has patient had a PCN reaction causing immediate rash, facial/tongue/throat swelling, SOB or lightheadedness with hypotension: Yes Has patient had a PCN reaction causing severe rash involving mucus membranes or skin necrosis: No Has patient had a PCN reaction that required hospitalization No Has patient had  a PCN reaction occurring within the last 10 y  . Zolpidem Shortness Of Breath  . Demerol [Meperidine] Other (See Comments)    Reaction:  Hallucinations    . Dilaudid [Hydromorphone Hcl] Other (See Comments)    Reaction:  Hallucinations   . Levofloxacin Other (See Comments)    Reaction:  Unknown    VITALS:  Blood pressure 130/82, pulse 93, temperature 97.6 F (36.4 C), temperature source Oral, resp. rate (!) 22, height 6\' 3"  (1.905 m), weight 126.6 kg, SpO2 96 %. PHYSICAL EXAMINATION:   GENERAL:  78 y.o.-year-old patient lying in the bed with no acute distress. He is however SOB EYES: Pupils equal, round, reactive to light and accommodation. No scleral icterus. Extraocular muscles intact.  HEENT: Head atraumatic, normocephalic. Oropharynx and nasopharynx clear.  NECK:  Supple, no jugular venous distention. No thyroid enlargement, no tenderness.  LUNGS: Decreased breath sounds bilaterally, mild wheezing, no rales,rhonchi or crepitation. No use of accessory muscles of respiration.  CARDIOVASCULAR: S1, S2 normal. No murmurs, rubs, or gallops.  ABDOMEN: Soft, nontender, nondistended. Bowel sounds present. No organomegaly or mass.  EXTREMITIES: Bilateral pitting edema Right>left , cyanosis, or clubbing. No rash or lesions. + pedal pulses MUSCULOSKELETAL: Normal bulk, and power was 5+ grip and elbow, knee, and ankle flexion and extension bilaterally.  NEUROLOGIC:Alert and oriented x 3. CN 2-12 intact. Sensation to light touch and cold stimuli intact bilaterally. Finger to nose nl. Babinski is downgoing. DTR's (biceps, patellar, and achilles) 2+ and symmetric throughout. Gait not tested due to safety concern. PSYCHIATRIC: The patient is alert and oriented x 3.  SKIN: No obvious rash, lesion, or ulcer.   DATA REVIEWED:  LABORATORY PANEL:  Male  CBC Recent Labs  Lab 04/07/19 0512  WBC 12.4*  HGB 9.3*  HCT 30.5*  PLT 199    ------------------------------------------------------------------------------------------------------------------ Chemistries  Recent Labs  Lab 04/07/19 0512  NA 129*  K 5.0  CL 85*  CO2 34*  GLUCOSE 288*  BUN 38*  CREATININE 1.46*  CALCIUM 8.4*   RADIOLOGY:  Dg Chest Portable 1 View  Result Date: 04/06/2019 CLINICAL DATA:  78 y/o M; worsening respiratory distress. History of COPD. EXAM: PORTABLE CHEST 1 VIEW COMPARISON:  03/30/2019 CT chest and chest radiograph. FINDINGS: Stable cardiac silhouette given projection and technique. Status post CABG. Large hiatal hernia. Left mid and lower lung zone linear, likely atelectasis. Emphysema. Aortic atherosclerosis with calcification. No pleural effusion or pneumothorax. No acute osseous abnormality is evident. IMPRESSION: Left mid and lower lung zone atelectasis. Large hiatal hernia. Aortic atherosclerosis. Electronically Signed   By: Kristine Garbe M.D.   On: 04/06/2019 06:15   ASSESSMENT AND PLAN:   78 y.o. male with pertinent past medical history of chronic respiratory failure, COPD, O2 dependent.  7 L at home, AAA status post endovascular repair, AVMs, anxiety, bipolar disorder, MI, CAD, CABG, dCHF, depression and anxiety, diabetes mellitus type 2, hypertension, hyperlipidemia, GERD, and chronic microcytic anemia presenting with worsening shortness of breath, and dry cough for the past 1 week.  1. Acute on chronic respiratory failure with hypoxia secondary to AECOPD /bronchitis - Chest x-ray reviewed,Left mid and lower lung zone linear, likely atelectasis?Infiltrate, likely chronicCOPD/emphysemicchanges, - SARS-CoV-2 target nucleic acids are NOT DETECTED. - Supplemental O2, goal sat 88-92%  2. Acute on Chronic COPD- patient with hx of COPDuseshome oxygen at 7L? - management as above - Budesonide 0.5mg  nebulized BID and DuoNeb QID - Corticosteroids: Prednisone 40mg  PO daily x 5 days - Doxycycline 100mg  PO bid -  Start Azithromycin 250 every other day indefinitely per pulmonary - nocturnal BiPAP for treatment of chronic hypercapnic respiratory failure - Pulmonary input appreciated - Palliative consult  3.  Chronic diastolic CHF - Last Echocardiogram on 10/05/2018 was reviewed Fording ejection fraction 60-65% negative for any LVH,report of systolic or diastolic failure, but history reveals diastolic CHF - Afterload, Goal MAP <70:Hold Lisinoprildue to worsening renal function - Beta-Blockade: Metoprolol - Diuretics: Hold  Furosemide for worsening renal function. - Low salt diet - Check daily weight - Strict I&Os  4.CAD/ with HX of CABG/MIs - ASA 81mg  PO daily - HTN, HLD, DM control as below  5.HLD = + Goal LDL<70 - Atorvastatin 10mg  PO qhs  6.HTN- stable + Goal BP <140/90 - Beta-blocker: Metoprolol - ACE-Inhibitor: Hold Lisinopril as above  7.Diabetes mellitus with complication (HCC) -check CBG QA CHS, with SSI. - continueLantus(anticipating blood sugars to run high due to steroids, will adjust with SSI  8. Iron deficiency anemia due to chronic blood loss-stable, continue iron supplements  9. Bipolar 1/major depressive disorder, recurrent, severe without psychotic features (HCC)-stable,continue home medication of Celexa, with a PRN Xanax  10. Bilateral Hand tremors- may likely be due to chronic hypercarbia - Cont. Requip - Neurology input appreciated   All the records are reviewed and case discussed with Care Management/Social Worker. Management plans discussed with the patient, family and they are in agreement.  CODE STATUS: DNR  TOTAL TIME TAKING CARE OF THIS PATIENT: 40 minutes.   More than 50% of the time was spent in counseling/coordination of care: YES  POSSIBLE D/C IN 1 DAYS, DEPENDING ON CLINICAL CONDITION.   on 04/07/2019 at 1:32 PM   Rufina Falco, DNP,  FNP-BC [facility] Nurse Practitioner   Between 7am to 6pm - Pager -  [phone]  After 6pm go to [email or web address] - Technical brewer [facility]  Office  [phone]  CC: Primary care physician; [name], MD  Note: This dictation was prepared with Dragon dictation along with smaller phrase technology. Any transcriptional errors that result from this process are unintentional.

## 2019-04-07 NOTE — Progress Notes (Signed)
Pt unable to sleep.  Gave prn xanax early in attempt to help pt relax and sleep.  Pt unhappy with room and remote control for tv, light on tv, closed caption on tv, thermometer on wall, etc. Dorna Bloom RN

## 2019-04-07 NOTE — Progress Notes (Signed)
Nutrition Brief Note RD working remotely.  RD received consult for assessment of nutrition requirements/status per COPD gold protocol  Wt Readings from Last 15 Encounters:  04/06/19 126.6 kg  03/30/19 126.6 kg  03/26/19 118.8 kg  03/05/19 124.4 kg  12/26/18 129.2 kg  10/14/18 119.1 kg  10/06/18 123.3 kg  02/08/18 116.1 kg  01/28/18 117.3 kg  01/20/18 116.3 kg  06/30/17 108.7 kg  05/10/17 115.7 kg  04/03/17 118 kg  03/30/17 116.8 kg  02/22/17 116.1 kg   Spoke with patient over the phone. He reports he has a good appetite and is eating well. He reports finishing about 85% of breakfast this morning. He could have finished the rest of the omelet but he forgot to ask for ketchup. He reports he has a good appetite and intake at baseline. He eats 2-3 meals per day and meals are prepared by his wife. He reports he is weight-stable and has not been losing weight. He is usually around 265 lbs, but he reports his scales at home are old, so they may not be accurate. Patient follows a carbohydrate-modified diet for his diabetes, avoids spicy foods due of hx of ulcers, and also follows heart-healthy diet. He does not have any nutrition questions/concerns at this time.  Body mass index is 34.87 kg/m. Patient meets criteria for obesity class I based on current BMI.   Current diet order is heart health/carbohydrate modified, patient is consuming approximately 85% of meals at this time per his report. Labs and medications reviewed.   No nutrition interventions warranted at this time. If nutrition issues arise, please consult RD.   Willey Blade, MS, [place], LDN Office: [phone] Pager: [phone] After Hours/Weekend Pager: [phone]

## 2019-04-07 NOTE — Evaluation (Signed)
Physical Therapy Evaluation Patient Details Name: [name] MRN: [id number] DOB: 25-Nov-1940 Today's Date: 04/07/2019   History of Present Illness  Pt is a 78 year old male with acute on chronic COPD exerbation, reporting to ED with SOB. PMH: chronic respiratory failure, COPD, O2 dependent.  7 L at home, AAA status post endovascular repair, AVMs, anxiety, bipolar disorder, MI, CAD, CABG, dCHF, depression and anxiety, diabetes mellitus type 2, hypertension, hyperlipidemia, GERD, and chronic microcytic anemia admitted and being treated for COPD exacerbation. Patient reports he has not been walking at home for "a long time" but cannot quantify how long. Reports he completes ADLs IND but his wife "sets them up for him" ie brings clothes to him to put on on his own, brings food to him and he feeds self, etc.  Clinical Impression  Pt is a 78 year old male with acute COPD exacerbation, complaints of SOB with exertion. Patient with current impairments in generalized weakness, decreased endurance, decreased energy conservation, and decreased knowledge of safety precautions. Patient requires heavy cuing for RW safety throughout ambulation around hospital room and CGA for safety. PT educated patient on energy conservation techniques and safety with ambulation which he is able to verbalize understanding of, but has difficulty with application. modI with bed mobility. CGA with STS and cuing needed for RW safety. Would benefit from skilled PT to address above deficits and promote optimal return to PLOF     Follow Up Recommendations      Equipment Recommendations       Recommendations for Other Services       Precautions / Restrictions        Mobility  Bed Mobility Overal bed mobility: Needs Assistance Bed Mobility: Supine to Sit;Sit to Supine   Sidelying to sit: Modified independent (Device/Increase time) Supine to sit: Modified independent (Device/Increase time) Sit to supine: Modified  independent (Device/Increase time)   General bed mobility comments: able to complete ind with use of momentum  Transfers Overall transfer level: Needs assistance Equipment used: Rolling walker (2 wheeled) Transfers: Sit to/from Stand Sit to Stand: Min guard         General transfer comment: Cuing for set up and technique with good carry over. Min guard for safey  Ambulation/Gait Ambulation/Gait assistance: Min guard Gait Distance (Feet): 50 Feet Assistive device: Rolling walker (2 wheeled) Gait Pattern/deviations: Shuffle;Decreased step length - left;Decreased step length - right Gait velocity: increased   General Gait Details: min foot clearance; short, quick steps, continous cuing for RW safety  Stairs            Wheelchair Mobility    Modified Rankin (Stroke Patients Only)       Balance Overall balance assessment: Needs assistance Sitting-balance support: No upper extremity supported;Feet unsupported Sitting balance-Leahy Scale: Good       Standing balance-Leahy Scale: Fair                               Pertinent Vitals/Pain Pain Assessment: No/denies pain    Home Living Family/patient expects to be discharged to:: Private residence Living Arrangements: Spouse/significant other Available Help at Discharge: Family Type of Home: House Home Access: Stairs to enter Entrance Stairs-Rails: Psychiatric nurse of Steps: 6   Home Equipment: Environmental consultant - 2 wheels;Cane - single point;Bedside commode;Other (comment) Additional Comments: pt reports he lays in bed most of the day, sits up on the EOB to eat     Prior  Function Level of Independence: Needs assistance   Gait / Transfers Assistance Needed: Walks household distances without AD  ADL's / Homemaking Assistance Needed: prepares simple meals, sponge bathes, toilets/dresses in, electric shaver, does not wear socks at home  Comments: pt reports he leads an extremely  sedentary  lifestyle     Hand Dominance   Dominant Hand: Right    Extremity/Trunk Assessment   Upper Extremity Assessment Upper Extremity Assessment: Generalized weakness    Lower Extremity Assessment Lower Extremity Assessment: Generalized weakness    Cervical / Trunk Assessment Cervical / Trunk Assessment: Normal  Communication   Communication: HOH  Cognition Arousal/Alertness: Awake/alert Behavior During Therapy: WFL for tasks assessed/performed Overall Cognitive Status: Within Functional Limits for tasks assessed                                        General Comments      Exercises Other Exercises Other Exercises: Bed mobility, able to complete supine/sit and scoot with no cuing from PT and increased momentum needed to complete Other Exercises: STS: CGA for safety with cuing needed for set up/handplacement and for controlled lowering Other Exercises: Gait: able to amb a couple feet to chair with O2 sat 94%, subsequently amb to door of room and back to chair with O2 sat 87% with quick return to 94% with pursed lip breathing. During ambulation continuous cuing for RW safety, to maintain feet inside RW. Patient very impulsive with RW and gait. Following PT educated patietn on purpose of RW for safety and to increase steadiness on feet as pt does report some fear of falling, but the need to use RW properly and how to use it properly to aid in this. Education on bigger steps with walking and breath techniques for energy conservation. Patient verbalizes understasnding.   Assessment/Plan    PT Assessment    PT Problem List         PT Treatment Interventions      PT Goals (Current goals can be found in the Care Plan section)       Frequency     Barriers to discharge        Co-evaluation               AM-PAC PT "6 Clicks" Mobility  Outcome Measure                  End of Session              Time: 2725-3664 PT Time Calculation (min)  (ACUTE ONLY): 25 min   Charges:   PT Evaluation $PT Eval Low Complexity: 1 Low PT Treatments $Gait Training: 8-22 mins $Therapeutic Exercise: 8-22 mins       Shelton Silvas PT, DPT  Shelton Silvas 04/07/2019, 1:16 PM
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
## 2019-04-08 ENCOUNTER — Encounter: Payer: Self-pay | Admitting: Primary Care

## 2019-04-08 DIAGNOSIS — F321 Major depressive disorder, single episode, moderate: Secondary | ICD-10-CM

## 2019-04-08 DIAGNOSIS — Z515 Encounter for palliative care: Secondary | ICD-10-CM

## 2019-04-08 DIAGNOSIS — Z7189 Other specified counseling: Secondary | ICD-10-CM

## 2019-04-08 LAB — CBC
HCT: 31.3 % — ABNORMAL LOW (ref 39.0–52.0)
Hemoglobin: 9.5 g/dL — ABNORMAL LOW (ref 13.0–17.0)
MCH: 28 pg (ref 26.0–34.0)
MCHC: 30.4 g/dL (ref 30.0–36.0)
MCV: 92.3 fL (ref 80.0–100.0)
Platelets: 205 10*3/uL (ref 150–400)
RBC: 3.39 MIL/uL — ABNORMAL LOW (ref 4.22–5.81)
RDW: 14.8 % (ref 11.5–15.5)
WBC: 11.3 10*3/uL — ABNORMAL HIGH (ref 4.0–10.5)
nRBC: 0 % (ref 0.0–0.2)

## 2019-04-08 LAB — BASIC METABOLIC PANEL
Anion gap: 9 (ref 5–15)
BUN: 38 mg/dL — ABNORMAL HIGH (ref 8–23)
CO2: 34 mmol/L — ABNORMAL HIGH (ref 22–32)
Calcium: 8.6 mg/dL — ABNORMAL LOW (ref 8.9–10.3)
Chloride: 89 mmol/L — ABNORMAL LOW (ref 98–111)
Creatinine, Ser: 1.21 mg/dL (ref 0.61–1.24)
GFR calc Af Amer: >60 mL/min (ref >60)
GFR calc non Af Amer: 57 mL/min — ABNORMAL LOW (ref >60)
Glucose, Bld: 209 mg/dL — ABNORMAL HIGH (ref 70–99)
Potassium: 4.3 mmol/L (ref 3.5–5.1)
Sodium: 132 mmol/L — ABNORMAL LOW (ref 135–145)

## 2019-04-08 LAB — GLUCOSE, CAPILLARY
Glucose-Capillary: 175 mg/dL — ABNORMAL HIGH (ref 70–99)
Glucose-Capillary: 160 mg/dL — ABNORMAL HIGH (ref 70–99)
Glucose-Capillary: 119 mg/dL — ABNORMAL HIGH (ref 70–99)
Glucose-Capillary: 124 mg/dL — ABNORMAL HIGH (ref 70–99)

## 2019-04-08 MED ORDER — CLONAZEPAM 0.5 MG PO TBDP
0.5000 mg | ORAL_TABLET | Freq: Two times a day (BID) | ORAL | Status: DC | PRN
  Administered 2019-04-10 – 2019-04-12 (×4): 0.5 mg via ORAL
  Filled 2019-04-08 (×4): qty 1

## 2019-04-08 MED ORDER — MENTHOL 3 MG MT LOZG
1.0000 | LOZENGE | OROMUCOSAL | Status: DC | PRN
  Administered 2019-04-11: 3 mg via ORAL
  Filled 2019-04-08: qty 9

## 2019-04-08 MED ORDER — CITALOPRAM HYDROBROMIDE 20 MG PO TABS
30.0000 mg | ORAL_TABLET | Freq: Every day | ORAL | Status: DC
  Administered 2019-04-09 – 2019-04-13 (×5): 30 mg via ORAL
  Filled 2019-04-08 (×5): qty 2

## 2019-04-08 MED ORDER — CEPASTAT 14.5 MG MT LOZG
1.0000 | LOZENGE | OROMUCOSAL | Status: DC | PRN
  Filled 2019-04-08: qty 9

## 2019-04-08 NOTE — Progress Notes (Addendum)
[place] [facility] [place] NAME: [name]    MR#:  [id number]  DATE OF BIRTH:  03/15/1941  SUBJECTIVE:   Chief Complaint  Patient presents with  . Respiratory Distress   Patient is seen at the bedside. He is still very SOB with minimal exertion. Per nursing staff, he has been nodding off intermittently while eating.  REVIEW OF SYSTEMS:  ROS Constitutional: Negative for chills, fever, malaise/fatigue and weight loss.  HENT: Negative for congestion, hearing loss and sore throat.   Eyes: Negative for blurred vision and double vision.  Respiratory: Positive for cough, sputum production, shortness of breath and wheezing.   Cardiovascular: Positive for orthopnea and leg swelling. Negative for chest pain and palpitations.  Gastrointestinal: Negative for abdominal pain, diarrhea, nausea and vomiting.  Genitourinary: Negative for dysuria and urgency.  Musculoskeletal: Positive for back pain. Negative for myalgias.  Skin: Negative for rash.  Neurological: Negative for dizziness, sensory change, speech change, focal weakness and headaches.  Psychiatric/Behavioral: Positive for depression. The patient is nervous/anxious and has insomnia.   DRUG ALLERGIES:   Allergies  Allergen Reactions  . Morphine And Related Shortness Of Breath, Nausea And Vomiting, Rash and Other (See Comments)    Reaction:  Hallucinations   . Penicillins Anaphylaxis, Hives and Other (See Comments)    10/02/18 - discussed with patient and he states he can tolerate penicillin capsules and had some hives when he was 78yo and received pcn injections  Has patient had a PCN reaction causing immediate rash, facial/tongue/throat swelling, SOB or lightheadedness with hypotension: Yes Has patient had a PCN reaction causing severe rash involving mucus membranes or skin necrosis: No Has patient had a PCN reaction that required hospitalization No Has patient had a PCN reaction occurring  within the last 10 y  . Zolpidem Shortness Of Breath  . Demerol [Meperidine] Other (See Comments)    Reaction:  Hallucinations    . Dilaudid [Hydromorphone Hcl] Other (See Comments)    Reaction:  Hallucinations   . Levofloxacin Other (See Comments)    Reaction:  Unknown    VITALS:  Blood pressure 132/67, pulse 81, temperature 98.3 F (36.8 C), resp. rate 20, height 6\' 3"  (1.905 m), weight 126.6 kg, SpO2 93 %. PHYSICAL EXAMINATION:   GENERAL:  78 y.o.-year-old patient lying in the bed with no acute distress. He is however SOB EYES: Pupils equal, round, reactive to light and accommodation. No scleral icterus. Extraocular muscles intact.  HEENT: Head atraumatic, normocephalic. Oropharynx and nasopharynx clear.  NECK:  Supple, no jugular venous distention. No thyroid enlargement, no tenderness.  LUNGS: Decreased breath sounds bilaterally, mild wheezing, no rales,rhonchi or crepitation. No use of accessory muscles of respiration.  CARDIOVASCULAR: S1, S2 normal. No murmurs, rubs, or gallops.  ABDOMEN: Soft, nontender, nondistended. Bowel sounds present. No organomegaly or mass.  EXTREMITIES: Bilateral pitting edema Right>left , cyanosis, or clubbing. No rash or lesions. + pedal pulses MUSCULOSKELETAL: Normal bulk, and power was 5+ grip and elbow, knee, and ankle flexion and extension bilaterally.  NEUROLOGIC:Alert and oriented x 3. CN 2-12 intact. Sensation to light touch and cold stimuli intact bilaterally. Finger to nose nl. Babinski is downgoing. DTR's (biceps, patellar, and achilles) 2+ and symmetric throughout. Gait not tested due to safety concern. PSYCHIATRIC: The patient is alert and oriented x 3.  SKIN: No obvious rash, lesion, or ulcer.   DATA REVIEWED:  LABORATORY PANEL:  Male CBC Recent Labs  Lab 04/08/19 0459  WBC 11.3*  HGB 9.5*  HCT 31.3*  PLT 205   ------------------------------------------------------------------------------------------------------------------  Chemistries  Recent Labs  Lab 04/08/19 0459  NA 132*  K 4.3  CL 89*  CO2 34*  GLUCOSE 209*  BUN 38*  CREATININE 1.21  CALCIUM 8.6*   RADIOLOGY:  No results found. ASSESSMENT AND PLAN:   78 y.o. male with pertinent past medical history of chronic respiratory failure, COPD, O2 dependent.  7 L at home, AAA status post endovascular repair, AVMs, anxiety, bipolar disorder, MI, CAD, CABG, dCHF, depression and anxiety, diabetes mellitus type 2, hypertension, hyperlipidemia, GERD, and chronic microcytic anemia presenting with worsening shortness of breath, and dry cough for the past 1 week.  1. Acute on chronic respiratory failure with hypoxia secondary to AECOPD /bronchitis - Chest x-ray reviewed,Left mid and lower lung zone linear, likely atelectasis?Infiltrate, likely chronicCOPD/emphysemicchanges, - SARS-CoV-2 target nucleic acids are NOT DETECTED. - Supplemental O2, goal sat 88-92%  2. Acute on Chronic COPD- patient with hx of COPDuseshome oxygen at 7L? - management as above - Budesonide 0.5mg  nebulized BID and DuoNeb QID - Corticosteroids: Prednisone 40mg  PO daily x 5 days - Doxycycline 100mg  PO bid - Start Azithromycin 250 every other day indefinitely per pulmonary - nocturnal BiPAP for treatment of chronic hypercapnic respiratory failure - Pulmonary input appreciated - Palliative consult  3.  Chronic diastolic CHF - Last Echocardiogram on 10/05/2018 was reviewed Fording ejection fraction 60-65% negative for any LVH,report of systolic or diastolic failure, but history reveals diastolic CHF - Afterload, Goal MAP <70:Hold Lisinoprildue to worsening renal function - Beta-Blockade: Metoprolol - Diuretics: Hold  Furosemide for worsening renal function. - Low salt diet - Check daily weight - Strict I&Os  4.CAD/ with HX of CABG/MIs - ASA 81mg  PO daily - HTN, HLD, DM control as below  5.HLD = + Goal LDL<70 - Atorvastatin 10mg  PO qhs  6.HTN- stable +  Goal BP <140/90 - Beta-blocker: Metoprolol - ACE-Inhibitor: Hold Lisinopril as above  7.Diabetes mellitus with complication (HCC) -check CBG QA CHS, with SSI. - continueLantus(anticipating blood sugars to run high due to steroids, will adjust with SSI  8. Iron deficiency anemia due to chronic blood loss-stable, continue iron supplements  9. Bipolar 1/major depressive disorder, recurrent, severe without psychotic features (HCC)-stable,continue home medication of Celexa, with a PRN Xanax - Start Clonazepam 0.5 BID PRN - Increase Celexa to 30 mg once a day - Stop Xanax - Psych input appreciated  10. Bilateral Hand tremors- may likely be due to chronic hypercarbia - Discontinue Requip due to excessive sleepiness   All the records are reviewed and case discussed with Care Management/Social Worker. Management plans discussed with the patient, family and they are in agreement.  CODE STATUS: DNR  TOTAL TIME TAKING CARE OF THIS PATIENT: 40 minutes.   More than 50% of the time was spent in counseling/coordination of care: YES  POSSIBLE D/C IN 1 DAYS, DEPENDING ON CLINICAL CONDITION.   on 04/08/2019 at 4:44 PM   Rufina Falco, DNP, FNP-BC [facility] Nurse Practitioner   Between 7am to 6pm - Pager - [phone]  After 6pm go to [email or web address] - Technical brewer [facility]  Office  [phone]  CC: Primary care physician; [name], MD  Note: This dictation was prepared with Dragon dictation along with smaller phrase technology. Any transcriptional errors that result from this process are unintentional.

## 2019-04-08 NOTE — Evaluation (Signed)
Occupational Therapy Evaluation Patient Details Name: [name] MRN: [id number] DOB: 04-05-1941 Today's Date: 04/08/2019    History of Present Illness Pt is a 78 year old male with acute on chronic COPD exerbation, reporting to ED with SOB. PMH: chronic respiratory failure, COPD, O2 dependent.  7 L at home, AAA status post endovascular repair, AVMs, anxiety, bipolar disorder, MI, CAD, CABG, dCHF, depression and anxiety, diabetes mellitus type 2, hypertension, hyperlipidemia, GERD, and chronic microcytic anemia admitted and being treated for COPD exacerbation. Patient reports he has not been walking at home for "a long time" but cannot quantify how long. Reports he completes ADLs IND but his wife "sets them up for him" ie brings clothes to him to put on on his own, brings food to him and he feeds self, etc.   Clinical Impression   Pt seen for OT evaluation this date. Pt required assistance with ADLs at baseline, is living with his wife in a house with 6 steps to enter and bilateral hand rails. Pt endorses being on 6 liters of O2 at home (chart states 7). Pt reports becoming easily fatigued or out of breath with minimal exertion. Pt currently requires min/mod assist for for bathing and dressing tasks due to poor activity tolerance. Pt educated in energy conservation conservation strategies including pursed lip breathing, activity pacing, home/routines modifications, work simplification, AE/DME, prioritizing of meaningful occupations, and falls prevention. Handout provided. Pt verbalized understanding but would benefit from additional skilled OT services to maximize recall and carryover of learned techniques and facilitate implementation of learned techniques into daily routines. Upon discharge, recommend HHOT services with supervision during ambulation and functional activities for safety.       Follow Up Recommendations  Home health OT;Supervision - Intermittent    Equipment Recommendations  3  in 1 bedside commode;Toilet rise with handles    Recommendations for Other Services       Precautions / Restrictions Precautions Precautions: Fall Restrictions Weight Bearing Restrictions: No      Mobility Bed Mobility Overal bed mobility: Needs Assistance Bed Mobility: Sit to Supine;Supine to Sit;Sidelying to Sit   Sidelying to sit: Modified independent (Device/Increase time) Supine to sit: Modified independent (Device/Increase time) Sit to supine: Modified independent (Device/Increase time)   General bed mobility comments: able to complete ind with use of momentum  Transfers Overall transfer level: Needs assistance   Transfers: Sit to/from Stand Sit to Stand: Supervision         General transfer comment: Pt stood at bedside with supervision on this date. Able to complete functional activity with no AE. Would require AE for increased distance/ambulation.    Balance Overall balance assessment: Needs assistance Sitting-balance support: No upper extremity supported;Feet unsupported Sitting balance-Leahy Scale: Good     Standing balance support: During functional activity;No upper extremity supported Standing balance-Leahy Scale: Fair                             ADL either performed or assessed with clinical judgement   ADL Overall ADL's : Needs assistance/impaired Eating/Feeding: Set up;Minimal assistance;With adaptive utensils;Sitting Eating/Feeding Details (indicate cue type and reason): Pt endorses tremors and unpredictable "jerking" of his hands. This was observed by the therapist. States this makes drinking from open cups and cutting his meats difficulty. Wife assists at baseline. Grooming: Sitting;Set up   Upper Body Bathing: Set up;Moderate assistance;Sitting Upper Body Bathing Details (indicate cue type and reason): Pt states "I havent had a  shower in 5 years" when asked about assist needed at baseline. Spongebathes with wifes assist. At this time  likely mod assist for UB/LB bathing while seated or at bed level. Lower Body Bathing: Set up;Moderate assistance;Sitting/lateral leans   Upper Body Dressing : Set up;Supervision/safety;Sitting   Lower Body Dressing: Set up;Minimal assistance;Moderate assistance;Sit to/from stand   Toilet Transfer: Agricultural consultant Details (indicate cue type and reason): Pt able to stand at EOB to use urinal on this date. Supervision for safety and set-up of urinal provided by this therapist. pt endorses doing this independently in room, however would be unsafe in this therapist's opinion. Toileting- Clothing Manipulation and Hygiene: Set up;Supervision/safety;Sit to/from stand       Functional mobility during ADLs: Min guard;Minimal assistance       Vision Baseline Vision/History: Cataracts Patient Visual Report: No change from baseline Additional Comments: Pt states he has had cataracts sx in R eye, but is still waiting to have left done. States vision is poor, but does not wear glasses.     Perception     Praxis      Pertinent Vitals/Pain Pain Assessment: No/denies pain     Hand Dominance Right   Extremity/Trunk Assessment Upper Extremity Assessment Upper Extremity Assessment: Generalized weakness   Lower Extremity Assessment Lower Extremity Assessment: Generalized weakness   Cervical / Trunk Assessment Cervical / Trunk Assessment: Normal   Communication Communication Communication: HOH   Cognition Arousal/Alertness: Lethargic;Suspect due to medications Behavior During Therapy: [facility] for tasks assessed/performed Overall Cognitive Status: Within Functional Limits for tasks assessed                                 General Comments: Pt intermittently appearing to fall asleep during session. Would have momentary episodes of time where eyes would close or roll back, speech stopped, and then sudden jerk or movement as if waking. RN notified. These  episodes happened in multiple positions including: sup, sitting, and standing. Potential safety concern for this pt as he also endorses moving in room without assist. OT strongly encouraged use of call bell and educated pt on falls prevention measures in the hospital.   General Comments  Pt at times tangential. endorses he has not slept well and feels lethargic. periodically appearing to suddenly fall asleep. RN notified.    Exercises Other Exercises Other Exercises: Pt educated in falls prevention strategies, safe use of AE for mobility/ADL, ECS including PLB during functional activity, and strategies for mgt of hand tremors. Pt verbalized understanding of education but would benefit from further education, particularly in AE options for ECS. Other Exercises: Pt assisted with toileting at bedside. Able to stand EOB to use urinal with set-up/supervision provided by this therapist. At times impulsive with movements. Requiring multimodal cueing to attend to tasks.   Shoulder Instructions      Home Living Family/patient expects to be discharged to:: Private residence Living Arrangements: Spouse/significant other Available Help at Discharge: Family Type of Home: House Home Access: Stairs to enter [employer] of Steps: 6 Entrance Stairs-Rails: Right;Left Home Layout: One level     Bathroom Shower/Tub: (Sponge bathes.)   Bathroom Toilet: Standard Bathroom Accessibility: Yes   Home Equipment: Walker - 2 wheels;Cane - single point;Bedside commode;Other (comment)   Additional Comments: pt reports he lays in bed most of the day, sits up on the EOB to eat       Prior Functioning/Environment Level of Independence: Needs  assistance  Gait / Transfers Assistance Needed: Walks household distances without AD ADL's / Homemaking Assistance Needed: prepares simple meals, sponge bathes, toilets/dresses in, electric shaver, does not wear socks at home   Comments: pt reports he leads an  extremely  sedentary lifestyle        OT Problem List: Decreased strength;Decreased coordination;Cardiopulmonary status limiting activity;Decreased range of motion;Decreased cognition;Decreased activity tolerance;Decreased safety awareness;Impaired balance (sitting and/or standing);Decreased knowledge of use of DME or AE;Impaired UE functional use      OT Treatment/Interventions: Self-care/ADL training;Modalities;Balance training;Therapeutic exercise;Therapeutic activities;Energy conservation;DME and/or AE instruction;Patient/family education    OT Goals(Current goals can be found in the care plan section) Acute Rehab OT Goals Patient Stated Goal: To go home OT Goal Formulation: With patient Time For Goal Achievement: 04/22/19 Potential to Achieve Goals: Fair ADL Goals Pt Will Perform Eating: with set-up;sitting;with adaptive utensils(With LRAD PRN for improved safety and functional independence.) Pt Will Perform Grooming: sitting;with supervision(With LRAD PRN for improved safety and functional independence.) Pt Will Perform Upper Body Bathing: sitting;with set-up;with min assist;with adaptive equipment(With LRAD PRN for improved safety and functional independence.) Pt Will Perform Lower Body Bathing: with set-up;with min assist;with adaptive equipment;sitting/lateral leans(With LRAD PRN for improved safety and functional independence.)  OT Frequency: Min 2X/week   Barriers to D/C: Inaccessible home environment          Co-evaluation              AM-PAC OT "6 Clicks" Daily Activity     Outcome Measure Help from another person eating meals?: A Little Help from another person taking care of personal grooming?: A Little Help from another person toileting, which includes using toliet, bedpan, or urinal?: A Little Help from another person bathing (including washing, rinsing, drying)?: A Lot Help from another person to put on and taking off regular upper body clothing?: A  Little Help from another person to put on and taking off regular lower body clothing?: A Lot 6 Click Score: 16   End of Session Equipment Utilized During Treatment: Gait belt;Oxygen Nurse Communication: (Pt lethargic. appearing to fall asleep intermittently during functional activities.)  Activity Tolerance: Patient tolerated treatment well;Patient limited by lethargy Patient left: in bed;with call bell/phone within reach;with bed alarm set  OT Visit Diagnosis: Other abnormalities of gait and mobility (R26.89);Muscle weakness (generalized) (M62.81)                Time: 5945-8592 OT Time Calculation (min): 29 min Charges:  OT General Charges $OT Visit: 1 Visit OT Evaluation $OT Eval Low Complexity: 1 Low OT Treatments $Self Care/Home Management : 23-37 mins  Shara Blazing, M.S., OTR/L Ascom: [phone] 04/08/19, 12:32 PM

## 2019-04-08 NOTE — Progress Notes (Signed)
Pastoral Care Visit   04/08/19 1600  Clinical Encounter Type  Visited With Patient  Visit Type Initial;Psychological support  Referral From Nurse  Consult/Referral To Chaplain  Spiritual Encounters  Spiritual Needs Emotional  Stress Factors  Patient Stress Factors Family relationships;Health changes;Loss of control   Chap contacted due to pt being depressed and needing someone to talk to.  Pt was alert and sitting up when chap entered room. Pt asked if I was there because I heard he was depressed and talked about killing himself. Chap redirected asking, "what do you want to talk about?"   Pt was happy to have visit and talked about various life topics from health to family to medical concerns.  Pt verbalized that he is bipolar. Pt regularly stopped himself mid-sentence to change topics.  Pt was easily disoriented.  Chap offered listening ear and attempted to validate pt self worth and the value of life.  Pt seemed to enjoy the visit but demonstrated agitation near end of the visit.  Pt requested addt'l chap visits. This chap believes pt is lonely and would benefit from addtl visits for companionship.  Darcey Nora, Chaplain

## 2019-04-08 NOTE — Progress Notes (Signed)
Patient refuses bed alarms at this time. Patient educated about safety.

## 2019-04-08 NOTE — Consult Note (Signed)
Consultation Note Date: 04/08/2019   Patient Name: [name]  DOB: 1941/02/03  MRN: [id number]  Age / Sex: 78 y.o., male  PCP: [name], MD Referring Physician: Lang Snow,*  Reason for Consultation: Establishing goals of care  HPI/Patient Profile: 78 y.o. male  with past medical history of anxiety and depression, COPD Gold stage IV, O2 7 L, CAD, diabetes, hypertension and cholesterol, obesity memory loss, hiatal hernia, GI bleed, AAA endovascular repair 2010, admitted on 04/06/2019 with acute on chronic hypoxemic hypercarbic respiratory failure with very severe COPD.   Clinical Assessment and Goals of Care: I have reviewed medical records including EPIC notes, labs and imaging, met with patient at the bedside to discuss diagnosis prognosis, GOC, EOL wishes, disposition and options.  [name] is lying in bed.  I ask if he can see me where I am sitting and he asks if he has to look at me, I share that he does not.  We discussed a brief life review of the patient.  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   As far as functional and nutritional status per PT notes [name] is independent with ADLs with set up.  [name] asks what is palliative medicine and says something else that is indecipherable.  When I asked him to repeat what he has said he asks if I have a hearing problem.  He then states that he has been told recently that people are having a hard time understanding him, but again asks if I have a hearing problem.  I share that palliative Medicine is specialized medical care for people living with serious illness. It focuses on providing relief from the symptoms and stress of a  serious illness. The goal is to improve quality of life for both the patient and the family.    We talked about disposition, SNF if qualified and if he wants to go.  He tells me that he does not want to go to someplace that is "roach infested, where they have illegal sex with you".  [name] states several times that I am trying to leave the room.  I am leaning back in the chair listening to him and tell him so.  He becomes argumentative asking, "I was in [place], have you ever been in war?".  When I told [name] that it seems that I am upsetting him and that now is not a good time for conversation he again states that I am trying to leave, "any excuse to jump up and leave".  He tells me that he would like to talk to a "decent person".  I shared that we will follow-up tomorrow, that case management and physical therapy are helping to work on disposition plan.  HCPOA    NEXT OF KIN - wife [name].    SUMMARY OF RECOMMENDATIONS   Undecided wether home with Palliative or rehab.   Code Status/Advance Care Planning:  DNR - [place] form completed.   Symptom Management:   Per hospitalist/psychiatrist, no additional needs at this time.  Palliative Prophylaxis:   Oral Care  Additional Recommendations (Limitations, Scope, Preferences):  treat the treatable, no CPR, no intubation  Psycho-social/Spiritual:   Desire for further Chaplaincy support:no  Additional Recommendations: Caregiving  Support/Resources and Education on Hospice  Prognosis:   Unable to determine, 6 months or less would not be surprising.  Discharge Planning: To Be Determined      Primary Diagnoses: Present on Admission: . COPD with acute exacerbation ([place])   I have reviewed the medical record, interviewed the patient and family, and examined the patient. The following aspects are pertinent.  Past Medical History:  Diagnosis Date  . AAA (abdominal aortic aneurysm) ([place])    a. 12/2008 s/p 7cm,  endovascular repair with coiling right hypogastric artery   . Acute [place] ulcer   . Adenomatous duodenal polyp   . Allergic rhinitis, cause unspecified   . Anxiety   . AVM (arteriovenous malformation) of colon with hemorrhage   . Bipolar 1 disorder, mixed, moderate ([place]) 04/16/2015  . CAD (coronary artery disease)    a. 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  . Chronic diastolic CHF (congestive heart failure) ([place])    a. 04/2015 Echo: EF 55-60%, no rwma, Gr 1 DD, mild AI.  Marland Kitchen Complication of anesthesia    "if they sedate me for too long, they have to intubate me; then they can't get me to come out of it" (04/03/2017)  . COPD (chronic obstructive pulmonary disease) ([place])    a. GOLD stage IV, started home O2. Severe bullous disease of LUL. Prolonged intubation after surgeries due to COPD.  Marland Kitchen Depression with anxiety 01/14/2013  . Diabetes mellitus with complication ([place])   . Diverticulosis   . Duodenal diverticulum   . Duodenal ulcer   . Emphysema of lung ([place])   . Esophagitis   . Essential hypertension 08/18/2009   Qualifier: Diagnosis of  By: Doy Mince LPN, Megan    . GERD (gastroesophageal reflux disease)   . GI bleed requiring more than 4 units of blood in 24 hours, ICU, or surgery    a. Hx bleeding gastric polyps, cecal & sigmoid AVMS s/p APC 03/30/14  . Hiatal hernia    large  . History of blood transfusion    "many many many; related to blood loss; anemia"  . Hyperlipidemia   . Insomnia 08/10/2014  . Leucocytosis 12/04/2013  . Major depressive disorder   . Memory loss   . Morbid obesity ([place])   . Multiple gastric polyps   . Myocardial infarction ([place])    "I think I had a minor one when I had the OHS"  . On home oxygen therapy    "7 liters [place] w/oxigenator" (04/03/2017)  . Pneumonia 2017  . Recurrent Microcytic Anemia    a. presumed chronic GI blood loss.  . Type II diabetes mellitus ([place])   . Vitamin D deficiency 08/10/2014   Social History    Socioeconomic History  . Marital status: Married    Spouse name: Not on file  . Number of children: Not on file  . Years of education: Not on file  . Highest education level: Not on file  Occupational History  .  Occupation: Retired    Fish farm manager: RETIRED  Social Needs  . Financial resource strain: Not on file  . Food insecurity    Worry: Not on file    Inability: Not on file  . Transportation needs    Medical: Not on file    Non-medical: Not on file  Tobacco Use  . Smoking status: Former Smoker    Packs/day: 2.00    Years: 50.00    Pack years: 100.00    Types: Cigarettes    Quit date: 11/18/2008    Years since quitting: 10.3  . Smokeless tobacco: Never Used  Substance and Sexual Activity  . Alcohol use: No    Alcohol/week: 0.0 standard drinks    Comment: quit in ~ 2010  . Drug use: No    Comment: "I smoked pot in the 1980s"  . Sexual activity: Never  Lifestyle  . Physical activity    Days per week: Not on file    Minutes per session: Not on file  . Stress: Not on file  Relationships  . Social Herbalist on phone: Not on file    Gets together: Not on file    Attends religious service: Not on file    Active member of club or organization: Not on file    Attends meetings of clubs or organizations: Not on file    Relationship status: Not on file  Other Topics Concern  . Not on file  Social History Narrative      Lives at home with his wife   Family History  Problem Relation Age of Onset  . Emphysema Mother   . Heart disease Mother   . ALS Father   . Diabetes Sister    Scheduled Meds: . atorvastatin  10 mg Oral q1800  . azithromycin  250 mg Oral QODAY  . budesonide (PULMICORT) nebulizer solution  0.5 mg Nebulization BID  . citalopram  20 mg Oral Daily  . docusate sodium  100 mg Oral BID  . enoxaparin (LOVENOX) injection  40 mg Subcutaneous Q24H  . ferrous sulfate  325 mg Oral TID WC  . fluticasone  2 spray Each Nare Daily  . gabapentin  600 mg  Oral TID  . insulin aspart  0-15 Units Subcutaneous TID WC  . insulin aspart  0-5 Units Subcutaneous QHS  . insulin glargine  60 Units Subcutaneous BID  . ipratropium-albuterol  3 mL Nebulization Q6H  . loratadine  10 mg Oral Daily  . metoprolol tartrate  12.5 mg Oral BID  . pantoprazole  40 mg Oral BID  . pramipexole  0.25 mg Oral QHS   Continuous Infusions: PRN Meds:.acetaminophen, ALPRAZolam, ipratropium-albuterol, menthol-cetylpyridinium Medications Prior to Admission:  Prior to Admission medications   Medication Sig Start Date End Date Taking? Authorizing Provider  albuterol (PROVENTIL HFA;VENTOLIN HFA) 108 (90 BASE) MCG/ACT inhaler Inhale 2 puffs into the lungs every 6 (six) hours as needed for wheezing or shortness of breath. 05/13/15  Yes Leslye Peer, Richard, MD  albuterol (PROVENTIL) (2.5 MG/3ML) 0.083% nebulizer solution Inhale 3 mLs (2.5 mg total) into the lungs every 2 (two) hours as needed for wheezing or shortness of breath. 12/31/18  Yes Elgergawy, Silver Huguenin, MD  ALPRAZolam (XANAX) 0.25 MG tablet Take 1 tablet (0.25 mg total) by mouth 3 (three) times daily as needed for anxiety or sleep. Patient taking differently: Take 0.25 mg by mouth daily as needed for anxiety or sleep.  01/20/18  Yes Henreitta Leber, MD  atorvastatin (LIPITOR)  10 MG tablet Take 1 tablet (10 mg total) by mouth daily at 6 PM. 03/05/19  Yes Eugenie Filler, MD  budesonide-formoterol [facility]) 160-4.5 MCG/ACT inhaler Inhale 2 puffs into the lungs 2 (two) times daily.    Yes [provider]  citalopram (CELEXA) 40 MG tablet Take 0.5 tablets (20 mg total) by mouth daily. Patient taking differently: Take 40 mg by mouth daily.  05/12/17  Yes Mariel Aloe, MD  [facility] MELATONIN 5 MG TABS Take 5 mg by mouth every evening. For sleep 03/14/19  Yes [provider]  ferrous sulfate 325 (65 FE) MG tablet Take 1 tablet (325 mg total) by mouth 3 (three) times daily with meals. 01/30/18  Yes Wieting, Richard, MD   fluticasone (FLONASE) 50 MCG/ACT nasal spray Place 2 sprays into both nostrils daily. 03/06/19  Yes Eugenie Filler, MD  furosemide (LASIX) 20 MG tablet Take 3 tablets (60 mg total) by mouth daily. 03/05/19  Yes Eugenie Filler, MD  gabapentin (NEURONTIN) 300 MG capsule Take 2 capsules (600 mg total) by mouth 3 (three) times daily. Patient taking differently: Take 600 mg by mouth 4 (four) times daily.  10/14/18 04/06/19 Yes Oswald Hillock, MD  insulin aspart (NOVOLOG) 100 UNIT/ML injection Inject 25 Units into the skin 3 (three) times daily with meals. 03/05/19  Yes Eugenie Filler, MD  insulin glargine (LANTUS) 100 UNIT/ML injection Inject 0.6 mLs (60 Units total) into the skin 2 (two) times daily. 03/05/19  Yes Eugenie Filler, MD  ipratropium-albuterol (DUONEB) 0.5-2.5 (3) MG/3ML SOLN He is used 3 times daily for next 5 days, then 3 times daily as needed Patient taking differently: Inhale 3 mLs into the lungs 3 (three) times daily as needed (shortness of breath/wheezing). He is used 3 times daily for next 5 days, then 3 times daily as needed 12/31/18  Yes Elgergawy, Silver Huguenin, MD  lisinopril (ZESTRIL) 2.5 MG tablet Take 1 tablet (2.5 mg total) by mouth daily. 03/06/19  Yes Eugenie Filler, MD  loratadine (CLARITIN) 10 MG tablet Take 1 tablet (10 mg total) by mouth daily. 03/06/19  Yes Eugenie Filler, MD  metoprolol tartrate (LOPRESSOR) 25 MG tablet Take 0.5 tablets (12.5 mg total) by mouth 2 (two) times daily. 03/05/19  Yes Eugenie Filler, MD  pantoprazole (PROTONIX) 40 MG tablet Take 1 tablet (40 mg total) by mouth 2 (two) times daily. 03/05/19  Yes Eugenie Filler, MD  acetaminophen (TYLENOL) 325 MG tablet Take 2 tablets (650 mg total) by mouth every 6 (six) hours as needed for mild pain. 12/31/18   Elgergawy, Silver Huguenin, MD  docusate sodium (COLACE) 100 MG capsule Take 1 capsule (100 mg total) by mouth 2 (two) times daily. 03/05/19   Eugenie Filler, MD  doxycycline (VIBRAMYCIN) 100  MG capsule Take 1 capsule (100 mg total) by mouth 2 (two) times daily for 7 days. 04/06/19 04/13/19  Rudene Re, MD  predniSONE (DELTASONE) 20 MG tablet Take 3 tablets (60 mg total) by mouth daily for 4 days. 04/06/19 04/10/19  Rudene Re, MD  tiotropium (SPIRIVA) 18 MCG inhalation capsule Place 1 capsule (18 mcg total) into inhaler and inhale daily as needed (shortness of breath). Patient not taking: Reported on 04/06/2019 10/06/18   [place] Shi, MD   Allergies  Allergen Reactions  . Morphine And Related Shortness Of Breath, Nausea And Vomiting, Rash and Other (See Comments)    Reaction:  Hallucinations   . Penicillins Anaphylaxis, Hives and Other (See  Comments)    10/02/18 - discussed with patient and he states he can tolerate penicillin capsules and had some hives when he was 78yo and received pcn injections  Has patient had a PCN reaction causing immediate rash, facial/tongue/throat swelling, SOB or lightheadedness with hypotension: Yes Has patient had a PCN reaction causing severe rash involving mucus membranes or skin necrosis: No Has patient had a PCN reaction that required hospitalization No Has patient had a PCN reaction occurring within the last 10 y  . Zolpidem Shortness Of Breath  . Demerol [Meperidine] Other (See Comments)    Reaction:  Hallucinations    . Dilaudid [Hydromorphone Hcl] Other (See Comments)    Reaction:  Hallucinations   . Levofloxacin Other (See Comments)    Reaction:  Unknown    Review of Systems  Unable to perform ROS: Acuity of condition    Physical Exam Vitals signs and nursing note reviewed.     Vital Signs: BP 140/83   Pulse 90   Temp (!) 97.5 F (36.4 C) (Oral)   Resp 20   Ht 6' 3"  (1.905 m)   Wt 126.6 kg   SpO2 (!) 89%   BMI 34.87 kg/m  Pain Scale: 0-10   Pain Score: 0-No pain   SpO2: SpO2: (!) 89 % O2 Device:SpO2: (!) 89 % O2 Flow Rate: .O2 Flow Rate (L/min): 5 L/min  IO: Intake/output summary:    Intake/Output Summary (Last 24 hours) at 04/08/2019 1404 Last data filed at 04/08/2019 0815 Gross per 24 hour  Intake 240 ml  Output 1045 ml  Net -805 ml    LBM: Last BM Date: 04/08/19 Baseline Weight: Weight: 126.6 kg Most recent weight: Weight: 126.6 kg     Palliative Assessment/Data:   Flowsheet Rows     Most Recent Value  Intake Tab  Referral Department  Hospitalist  Unit at Time of Referral  Med/Surg Unit  Palliative Care Primary Diagnosis  Pulmonary  Date Notified  04/07/19  Reason for referral  Clarify Goals of Care  Date of Admission  04/06/19  Date first seen by Palliative Care  04/08/19  # of days Palliative referral response time  1 Day(s)  # of days IP prior to Palliative referral  1  Clinical Assessment  Palliative Performance Scale Score  40%  Psychosocial & Spiritual Assessment  Palliative Care Outcomes      Time In: 1330 Time Out: 1440 Time Total: 70 minutes  Greater than 50%  of this time was spent counseling and coordinating care related to the above assessment and plan.  Signed by: Drue Novel, NP   Please contact Palliative Medicine Team phone at [phone] for questions and concerns.  For individual provider: See Shea Evans
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
## 2019-04-08 NOTE — Progress Notes (Signed)
2133 Pt with severe anxiety.  Gave pt xanax.  2348  Pt still complaining of severe anxiety, unable to sleep, very irritable and argumentative.  Gave additional xanax.  Pt was placed on bipap but after 5 minutes complained that it hurt and asked to have it removed.  Pt placed back on 4 L of oxygen [place].  Pt also complaining of generalized body aches.  Tylenol given. Dorna Bloom RN

## 2019-04-09 LAB — GLUCOSE, CAPILLARY
Glucose-Capillary: 110 mg/dL — ABNORMAL HIGH (ref 70–99)
Glucose-Capillary: 146 mg/dL — ABNORMAL HIGH (ref 70–99)
Glucose-Capillary: 145 mg/dL — ABNORMAL HIGH (ref 70–99)
Glucose-Capillary: 211 mg/dL — ABNORMAL HIGH (ref 70–99)
Glucose-Capillary: 229 mg/dL — ABNORMAL HIGH (ref 70–99)

## 2019-04-09 LAB — BASIC METABOLIC PANEL
Anion gap: 10 (ref 5–15)
BUN: 35 mg/dL — ABNORMAL HIGH (ref 8–23)
CO2: 33 mmol/L — ABNORMAL HIGH (ref 22–32)
Calcium: 8.3 mg/dL — ABNORMAL LOW (ref 8.9–10.3)
Chloride: 91 mmol/L — ABNORMAL LOW (ref 98–111)
Creatinine, Ser: 1.3 mg/dL — ABNORMAL HIGH (ref 0.61–1.24)
GFR calc Af Amer: >60 mL/min (ref >60)
GFR calc non Af Amer: 53 mL/min — ABNORMAL LOW (ref >60)
Glucose, Bld: 144 mg/dL — ABNORMAL HIGH (ref 70–99)
Potassium: 4.1 mmol/L (ref 3.5–5.1)
Sodium: 134 mmol/L — ABNORMAL LOW (ref 135–145)

## 2019-04-09 MED ORDER — DM-GUAIFENESIN ER 30-600 MG PO TB12: 1.0000 | ORAL_TABLET | Freq: Two times a day (BID) | ORAL | Status: DC

## 2019-04-09 MED ORDER — MORPHINE SULFATE (CONCENTRATE) 10 MG/0.5ML PO SOLN
2.6000 mg | ORAL | Status: AC
  Administered 2019-04-09 – 2019-04-10 (×6): 5 mg via ORAL
  Filled 2019-04-09 (×6): qty 1

## 2019-04-09 MED ORDER — DEXTROMETHORPHAN POLISTIREX ER 30 MG/5ML PO SUER
30.0000 mg | Freq: Two times a day (BID) | ORAL | Status: DC
  Administered 2019-04-09 – 2019-04-13 (×9): 30 mg via ORAL
  Filled 2019-04-09 (×10): qty 5

## 2019-04-09 MED ORDER — SENNOSIDES-DOCUSATE SODIUM 8.6-50 MG PO TABS
1.0000 | ORAL_TABLET | Freq: Two times a day (BID) | ORAL | Status: DC
  Administered 2019-04-09 – 2019-04-12 (×3): 1 via ORAL
  Filled 2019-04-09 (×6): qty 1

## 2019-04-09 MED ORDER — GUAIFENESIN ER 600 MG PO TB12
600.0000 mg | ORAL_TABLET | Freq: Two times a day (BID) | ORAL | Status: DC
  Administered 2019-04-09 – 2019-04-13 (×9): 600 mg via ORAL
  Filled 2019-04-09 (×9): qty 1

## 2019-04-09 MED ORDER — SALINE SPRAY 0.65 % NA SOLN
1.0000 | NASAL | Status: DC | PRN
  Administered 2019-04-09: 1 via NASAL
  Filled 2019-04-09: qty 44

## 2019-04-09 NOTE — TOC Progression Note (Signed)
Transition of Care [name]) - Progression Note    Patient Details  Name: NIKOLUS MARCZAK MRN: [id number] Date of Birth: September 13, 1941  [facility] CM/SW Contact  Shelbie Hutching, RN Phone Number: 04/09/2019, 2:14 PM  Clinical Narrative:    Patient is agreeable to NIV trilogy and afflo vest.  Brad with [employer] has spoken with patient about this equipment for home.  PFT ordered to ensure patient qualifies for the NIV.     Expected Discharge Plan: [place] Barriers to Discharge: Continued Medical Work up  Expected Discharge Plan and Services Expected Discharge Plan: [place]   Discharge Planning Services: CM Consult Post Acute Care Choice: [place] arrangements for the past 2 months: Single Family Home Expected Discharge Date: 04/08/19                                     Social Determinants of Health (SDOH) Interventions    Readmission Risk Interventions No flowsheet data found.

## 2019-04-09 NOTE — Progress Notes (Signed)
Physical Therapy Treatment Patient Details Name: [name] MRN: [id number] DOB: 1941/01/22 Today's Date: 04/09/2019    History of Present Illness Pt is a 78 year old male with acute on chronic COPD exerbation, reporting to ED with SOB. PMH: chronic respiratory failure, COPD, O2 dependent.  7 L at home, AAA status post endovascular repair, AVMs, anxiety, bipolar disorder, MI, CAD, CABG, dCHF, depression and anxiety, diabetes mellitus type 2, hypertension, hyperlipidemia, GERD, and chronic microcytic anemia admitted and being treated for COPD exacerbation. Patient reports he has not been walking at home for "a long time" but cannot quantify how long. Reports he completes ADLs IND but his wife "sets them up for him" ie brings clothes to him to put on on his own, brings food to him and he feeds self, etc.    PT Comments    Patietn is able to demonstrate good carry over with RW with STS. PT provided CGA over 21ft of ambulation for safety. Patient is requiring some cuing to "stay inside RW" around turns, but much better with RW than previous session. Patient with decreased cadence and better breath control following some cuing at beginning of session. Pt with continued difficulty with increasing stride and foot clearance to reduce fall risk despite cuing. Would benefit from skilled PT to address above deficits and promote optimal return to PLOF    Follow Up Recommendations  Home health PT     Equipment Recommendations  Rolling walker with 5" wheels    Recommendations for Other Services       Precautions / Restrictions      Mobility  Bed Mobility Overal bed mobility: Needs Assistance Bed Mobility: Sit to Supine;Supine to Sit;Sidelying to Sit   Sidelying to sit: Modified independent (Device/Increase time) Supine to sit: Modified independent (Device/Increase time) Sit to supine: Modified independent (Device/Increase time)   General bed mobility comments: able to complete ind with  use of momentum  Transfers Overall transfer level: Needs assistance Equipment used: Rolling walker (2 wheeled) Transfers: Sit to/from Stand Sit to Stand: Modified independent (Device/Increase time)         General transfer comment: Stood with RW w/ good carry over of previous safety concerns  Ambulation/Gait Ambulation/Gait assistance: Supervision Gait Distance (Feet): 200 Feet Assistive device: Rolling walker (2 wheeled) Gait Pattern/deviations: Shuffle;Decreased step length - left;Decreased step length - right Gait velocity: decreased   General Gait Details: Better RW safety, cuing for RW safety around turns (less compliance keeping feet inside RW), cuing for larger stride and foot clearance with decent carry over   Stairs             Wheelchair Mobility    Modified Rankin (Stroke Patients Only)       Balance                                            Cognition Arousal/Alertness: Awake/alert Behavior During Therapy: WFL for tasks assessed/performed Overall Cognitive Status: Within Functional Limits for tasks assessed                                 General Comments: Pt intermittently appearing to fall asleep during session. Would have momentary episodes of time where eyes would close or roll back, speech stopped, and then sudden jerk or movement as if waking. RN notified.  These episodes happened in multiple positions including: sup, sitting, and standing. Potential safety concern for this pt as he also endorses moving in room without assist. OT strongly encouraged use of call bell and educated pt on falls prevention measures in the hospital.      Exercises Other Exercises Other Exercises: Patinet is able to complete STS with good carry over of previous session with RW safety Other Exercises: Amb over 242ft; cuing for larger steps for energy conservation and foot clearance with decent carry over. Min cuing to "keep feet inside RW"  which patient is able to complete better, with increased difficulty with this around turns. O2 on 4L decreased to 91%, on 6L stayed above 95%    General Comments        Pertinent Vitals/Pain Pain Assessment: No/denies pain    Home Living                      Prior Function            PT Goals (current goals can now be found in the care plan section) Acute Rehab PT Goals Patient Stated Goal: To go home Progress towards PT goals: Progressing toward goals    Frequency    Min 2X/week      PT Plan Current plan remains appropriate    Co-evaluation              AM-PAC PT "6 Clicks" Mobility   Outcome Measure  Help needed turning from your back to your side while in a flat bed without using bedrails?: A Little Help needed moving from lying on your back to sitting on the side of a flat bed without using bedrails?: A Little Help needed moving to and from a bed to a chair (including a wheelchair)?: None Help needed standing up from a chair using your arms (e.g., wheelchair or bedside chair)?: A Little Help needed to walk in hospital room?: None Help needed climbing 3-5 steps with a railing? : A Lot 6 Click Score: 19    End of Session Equipment Utilized During Treatment: Gait belt Activity Tolerance: Patient tolerated treatment well Patient left: in bed;with call bell/phone within reach(refuses bed alarm) Nurse Communication: Mobility status;Other (comment)(pt refused chair alarm) PT Visit Diagnosis: Unsteadiness on feet (R26.81);Other abnormalities of gait and mobility (R26.89);Muscle weakness (generalized) (M62.81);History of falling (Z91.81);Repeated falls (R29.6);Difficulty in walking, not elsewhere classified (R26.2)     Time: 1202-1220 PT Time Calculation (min) (ACUTE ONLY): 18 min  Charges:  $Gait Training: 8-22 mins                     Shelton Silvas PT, DPT   [place] 04/09/2019, 1:25 PM

## 2019-04-09 NOTE — TOC Progression Note (Signed)
Transition of Care [facility]) - Progression Note    Patient Details  Name: [name] MRN: [id number] Date of Birth: [birth year]  Transition of Care [facility]) CM/SW Contact  Shelbie Hutching, RN Phone Number: 04/09/2019, 10:30 AM  Clinical Narrative:    RNCM reached out to speak with patient's wife.  Discussed discharge plan of care.  Patient wishes to go home, wife would like for patient to go to a nursing home because he is requiring a lot of assistance at home.  Wife reports that they cannot afford to pay for SNF.  Wife voices that the patient can come home and they will cont to do what they have been.   RNCM will arrange home health services, palliative will discuss hospice.  RNCM will keep wife updated on discharge plan.     Expected Discharge Plan: [place] Barriers to Discharge: Continued Medical Work up  Expected Discharge Plan and Services Expected Discharge Plan: [place]   Discharge Planning Services: CM Consult Post Acute Care Choice: [place] arrangements for the past 2 months: Single Family Home Expected Discharge Date: 04/08/19                                     Social Determinants of Health (SDOH) Interventions    Readmission Risk Interventions No flowsheet data found.

## 2019-04-09 NOTE — Progress Notes (Signed)
Patients pulmonary function test (FEV1/FEV1FVC) results showed the patient to be very severely obstructed.  Results are as follows:                  PRE#1    Predicted     % FVC:           2.19      5.04             43 FEV1:         0.99       3.94            25 FEV1/FVC: 45.2      76.2            59

## 2019-04-09 NOTE — Progress Notes (Addendum)
[place] [place] NAME: [name]    MR#:  [id number]  DATE OF BIRTH:  [birth year]  SUBJECTIVE:   Chief Complaint  Patient presents with  . Respiratory Distress   Patient is seen at the bedside. He is still very SOB with minimal exertion. No significant changes from prior assessment.  REVIEW OF SYSTEMS:  ROS Constitutional: Negative for chills, fever, malaise/fatigue and weight loss.  HENT: Negative for congestion, hearing loss and sore throat.   Eyes: Negative for blurred vision and double vision.  Respiratory: Positive for cough, sputum production, shortness of breath and wheezing.   Cardiovascular: Positive for orthopnea and leg swelling. Negative for chest pain and palpitations.  Gastrointestinal: Negative for abdominal pain, diarrhea, nausea and vomiting.  Genitourinary: Negative for dysuria and urgency.  Musculoskeletal: Positive for back pain. Negative for myalgias.  Skin: Negative for rash.  Neurological: Negative for dizziness, sensory change, speech change, focal weakness and headaches.  Psychiatric/Behavioral: Positive for depression. The patient is nervous/anxious and has insomnia.   DRUG ALLERGIES:   Allergies  Allergen Reactions  . Morphine And Related Shortness Of Breath, Nausea And Vomiting, Rash and Other (See Comments)    Reaction:  Hallucinations   . Penicillins Anaphylaxis, Hives and Other (See Comments)    10/02/18 - discussed with patient and he states he can tolerate penicillin capsules and had some hives when he was 78yo and received pcn injections  Has patient had a PCN reaction causing immediate rash, facial/tongue/throat swelling, SOB or lightheadedness with hypotension: Yes Has patient had a PCN reaction causing severe rash involving mucus membranes or skin necrosis: No Has patient had a PCN reaction that required hospitalization No Has patient had a PCN reaction occurring within the last 10 y  .  Zolpidem Shortness Of Breath  . Demerol [Meperidine] Other (See Comments)    Reaction:  Hallucinations    . Dilaudid [Hydromorphone Hcl] Other (See Comments)    Reaction:  Hallucinations   . Levofloxacin Other (See Comments)    Reaction:  Unknown    VITALS:  Blood pressure 125/71, pulse 81, temperature 97.7 F (36.5 C), temperature source Oral, resp. rate (!) 22, height 6\' 3"  (1.905 m), weight 126.6 kg, SpO2 97 %. PHYSICAL EXAMINATION:   GENERAL:  78 y.o.-year-old patient lying in the bed with no acute distress. He is however SOB EYES: Pupils equal, round, reactive to light and accommodation. No scleral icterus. Extraocular muscles intact.  HEENT: Head atraumatic, normocephalic. Oropharynx and nasopharynx clear.  NECK:  Supple, no jugular venous distention. No thyroid enlargement, no tenderness.  LUNGS: Decreased breath sounds bilaterally, mild wheezing, no rales,rhonchi or crepitation. No use of accessory muscles of respiration.  CARDIOVASCULAR: S1, S2 normal. No murmurs, rubs, or gallops.  ABDOMEN: Soft, nontender, nondistended. Bowel sounds present. No organomegaly or mass.  EXTREMITIES: Bilateral pitting edema Right>left , cyanosis, or clubbing. No rash or lesions. + pedal pulses MUSCULOSKELETAL: Normal bulk, and power was 5+ grip and elbow, knee, and ankle flexion and extension bilaterally.  NEUROLOGIC:Alert and oriented x 3. CN 2-12 intact. Sensation to light touch and cold stimuli intact bilaterally. Finger to nose nl. Babinski is downgoing. DTR's (biceps, patellar, and achilles) 2+ and symmetric throughout. Gait not tested due to safety concern. PSYCHIATRIC: The patient is alert and oriented x 3.  SKIN: No obvious rash, lesion, or ulcer.   DATA REVIEWED:  LABORATORY PANEL:  Male CBC Recent Labs  Lab 04/08/19 0459  WBC 11.3*  HGB 9.5*  HCT 31.3*  PLT 205   ------------------------------------------------------------------------------------------------------------------  Chemistries  Recent Labs  Lab 04/09/19 0615  NA 134*  K 4.1  CL 91*  CO2 33*  GLUCOSE 144*  BUN 35*  CREATININE 1.30*  CALCIUM 8.3*   RADIOLOGY:  No results found. ASSESSMENT AND PLAN:   78 y.o. male with pertinent past medical history of chronic respiratory failure, COPD, O2 dependent.  77 L at home, AAA status post endovascular repair, AVMs, anxiety, bipolar disorder, MI, CAD, CABG, dCHF, depression and anxiety, diabetes mellitus type 2, hypertension, hyperlipidemia, GERD, and chronic microcytic anemia presenting with worsening shortness of breath, and dry cough for the past 1 week.  1. Acute on chronic respiratory failure with hypoxia secondary to AECOPD /bronchitis - Chest x-ray reviewed,Left mid and lower lung zone linear, likely atelectasis?Infiltrate, likely chronicCOPD/emphysemicchanges, - Supplemental O2, goal sat 88-92%  2. 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 - management as above - Budesonide 0.5mg  nebulized BID and DuoNeb QID - Corticosteroids: Prednisone 40mg  PO daily x 5 days - Doxycycline 100mg  PO bid - Start Azithromycin 250 every other day indefinitely per pulmonary - nocturnal BiPAP for treatment of chronic hypercapnic respiratory failure - Pulmonary input appreciated - Palliative following  3.  Chronic diastolic CHF - Last Echocardiogram on 10/05/2018 was reviewed Fording ejection fraction 60-65% negative for any LVH,report of systolic or diastolic failure, but history reveals diastolic CHF - Afterload, Goal MAP <70:Hold Lisinoprildue to worsening renal function - Beta-Blockade: Metoprolol - Diuretics: Hold  Furosemide for worsening renal function. - Low salt diet - Check daily weight - Strict I&Os  4.CAD/ with HX of CABG/MIs - ASA 81mg  PO daily - HTN, HLD, DM control as below  5.HLD = + Goal LDL<70 - Atorvastatin 10mg  PO qhs  6.HTN- stable + Goal BP <140/90 - Beta-blocker:  Metoprolol - ACE-Inhibitor: Hold Lisinopril as above  7.Diabetes mellitus with complication (HCC) -check CBG QA CHS, with SSI. - continueLantus(anticipating blood sugars to run high due to steroids, will adjust with SSI  8. Iron deficiency anemia due to chronic blood loss-stable, continue iron supplements  9. Bipolar 1/major depressive disorder, recurrent, severe without psychotic features (HCC)-stable,continue home medication of Celexa, with a PRN Xanax - Clonazepam 0.5 BID PRN - Continue Celexa to 30 mg once a day - Psych input appreciated  10. Bilateral Hand tremors- may likely be due to chronic hypercarbia  All the records are reviewed and case discussed with Care Management/Social Worker. Management plans discussed with the patient, family and they are in agreement.  CODE STATUS: DNR  TOTAL TIME TAKING CARE OF THIS PATIENT: 40 minutes.   More than 50% of the time was spent in counseling/coordination of care: YES  POSSIBLE D/C IN 1 DAYS, DEPENDING ON CLINICAL CONDITION.   on 04/09/2019 at 6:23 PM   Rufina Falco, DNP, FNP-BC [facility] Nurse Practitioner   Between 7am to 6pm - Pager - [phone]  After 6pm go to [email or web address] - Technical brewer [facility]  Office  [phone]  CC: Primary care physician; [name], MD  Note: This dictation was prepared with Dragon dictation along with smaller phrase technology. Any transcriptional errors that result from this process are unintentional.

## 2019-04-09 NOTE — Progress Notes (Signed)
New referral for outpatient Palliative to follow at home received from [name]. Plan is for discharge home tomorrow. Patient information faxed to referral. Thank you. Flo Shanks BSN, RN, [facility] [employer] [phone]

## 2019-04-09 NOTE — TOC Initial Note (Signed)
Transition of Care [facility]) - Initial/Assessment Note    Patient Details  Name: [name] MRN: [id number] Date of Birth: 1941/06/13  Transition of Care [facility]) CM/SW Contact:    Shelbie Hutching, RN Phone Number: 04/09/2019, 9:38 AM  Clinical Narrative:                 Patient admitted to the hospital with COPD exacerbation.  Patient has frequent admission for the same in the past 6 months.  Patient is from home where he lives with his wife.  Patient reports that his COPD has been interfering with his quality of life, he cannot do much without getting short of breath.  Patient is on chronic O2, oxygen supplier is [employer].  Patient would like to go home, he has used [facility] in the past and was happy with them.  RNCM discussed private pay personal care services but patient reports that would be too expensive.  Discussed hospice at home briefly, will wait for palliative recommendations.    Expected Discharge Plan: [place] Barriers to Discharge: Continued Medical Work up   Patient Goals and CMS Choice Patient states their goals for this hospitalization and ongoing recovery are:: Patient wants to be at home, he does not want to go to a facility, he wants to be in the home he has lived in for 27 years CMS Medicare.gov Compare Post Acute Care list provided to:: Patient Choice offered to / list presented to : Patient  Expected Discharge Plan and Services Expected Discharge Plan: [place]   Discharge Planning Services: CM Consult Post Acute Care Choice: [place] arrangements for the past 2 months: Single Family Home Expected Discharge Date: 04/08/19                                    Prior Living Arrangements/Services Living arrangements for the past 2 months: Single Family Home Lives with:: Spouse Patient language and need for interpreter reviewed:: No Do you feel safe going back to the place where you live?: Yes       Need for Family Participation in Patient Care: Yes (Comment)(COPD) Care giver support system in place?: Yes (comment)(wife) Current home services: DME(oxygen) Criminal Activity/Legal Involvement Pertinent to Current Situation/Hospitalization: No - Comment as needed  Activities of Daily Living Home Assistive Devices/Equipment: Oxygen ADL Screening (condition at time of admission) Patient's cognitive ability adequate to safely complete daily activities?: Yes Is the patient deaf or have difficulty hearing?: No Does the patient have difficulty seeing, even when wearing glasses/contacts?: No Does the patient have difficulty concentrating, remembering, or making decisions?: No Patient able to express need for assistance with ADLs?: Yes Does the patient have difficulty dressing or bathing?: Yes Independently performs ADLs?: No Communication: Independent Dressing (OT): Needs assistance Is this a change from baseline?: Pre-admission baseline Grooming: Independent Feeding: Independent Bathing: Dependent Is this a change from baseline?: Pre-admission baseline Toileting: Independent with device (comment) Is this a change from baseline?: Pre-admission baseline In/Out Bed: Independent Walks in Home: Independent Does the patient have difficulty walking or climbing stairs?: Yes Weakness of Legs: Both Weakness of Arms/Hands: None  Permission Sought/Granted Permission sought to share information with : Family Supports, Case Manager, Other (comment)       Permission granted to share info w AGENCY: [place] granted to share info w Relationship: Wife  Emotional Assessment Appearance:: Appears stated age Attitude/Demeanor/Rapport: Engaged Affect (typically observed): Accepting Orientation: : Oriented to Self, Oriented to Place, Oriented to  Time, Oriented to Situation Alcohol / Substance Use: Not Applicable Psych Involvement: No (comment)  Admission diagnosis:   COPD exacerbation ([place]) [J44.1] Patient Active Problem List   Diagnosis Date Noted  . Goals of care, counseling/discussion   . Palliative care by specialist   . Encounter for hospice care discussion   . COPD with acute exacerbation ([place]) 04/06/2019  . COPD exacerbation ([place]) 02/28/2019  . Community acquired pneumonia of left lower lobe of lung ([place])   . Hypoxia 12/26/2018  . Acute on chronic respiratory failure with hypoxia ([place])   . Acute on chronic diastolic CHF (congestive heart failure) ([place]) 10/02/2018  . Acute respiratory failure ([place]) 10/02/2018  . Acute [place] ulcer   . Acute GI bleeding 02/08/2018  . Anemia due to chronic kidney disease   . AKI (acute kidney injury) ([place]) 05/08/2017  . Diabetes mellitus with complication ([place]) 14/97/0263  . Melena   . Benign neoplasm of sigmoid colon   . Benign neoplasm of descending colon   . AVM (arteriovenous malformation) of colon with hemorrhage   . GI bleed 04/05/2017  . Intermittent left lower quadrant abdominal pain   . Generalized weakness 11/09/2016  . Symptomatic anemia 10/21/2016  . Low back pain 07/02/2015  . Gastric AVM   . AVM (arteriovenous malformation) of duodenum, acquired with hemorrhage   . Bipolar 1 disorder, mixed, moderate ([place]) 04/16/2015  . Major depressive disorder, recurrent, severe without psychotic features ([place])   . Supplemental oxygen dependent 11/30/2014  . Iron deficiency anemia due to chronic blood loss 11/25/2014  . Vitamin D deficiency 08/10/2014  . Insomnia 08/10/2014  . Polypharmacy 08/10/2014  . Chronic pain syndrome 08/10/2014  . 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 Abdominal pain 12/04/2013  . Aftercare following surgery of the circulatory system, [place] 11/04/2013  . Acute blood loss anemia 10/16/2013  . Multiple gastric polyps 09/10/2013  . Anxiety state 09/10/2013  . 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  Chronic hypoxemic respiratory failure ([place]) 05/21/2013  . Acute on chronic respiratory failure with hypoxia and hypercapnia ([place]) 12/04/2012  . Chronic GI bleeding 05/17/2012  . CAD (coronary artery disease) 04/17/2012  . Chronic diastolic CHF (congestive heart failure) ([place]) 03/27/2012  . COPD (chronic obstructive pulmonary disease) ([place]) 09/13/2011  . CERUMEN IMPACTION, BILATERAL 11/11/2010  . Hyperlipidemia 08/18/2009  . Essential hypertension 08/18/2009  . ALLERGIC RHINITIS 08/18/2009  . AAA (abdominal aortic aneurysm) ([place]) 12/15/2008   PCP:  [name], MD Pharmacy:   [id number] [place], [address] [place] [address] Phone: [phone] Fax: [phone]     Social Determinants of Health (SDOH) Interventions    Readmission Risk Interventions No flowsheet data found.

## 2019-04-09 NOTE — Care Management Important Message (Signed)
Important Message  Patient Details  Name: [name] MRN: [id number] Date of Birth: April 17, 1941   Medicare Important Message Given:  Yes     Juliann Pulse A Merissa Renwick 04/09/2019, 10:45 AM

## 2019-04-09 NOTE — Progress Notes (Signed)
Palliative: [name] is resting quietly in bed.  He makes and keeps eye contact.  He is in no acute distress, but appears chronically ill and frail.  There is no family at bedside at this time due to visitor restrictions.   We talked about his breathlessness, but he is easily distracted.  He is agreeable to a trial of morphine by mouth.  We also talked about outpatient palliative care.  He is agreeable to outpatient services if they can help him with symptom management.  Conference with attending and RN case management related to patient condition, needs, disposition plan.  Plan:  Trial of PO by mouth for breathlessness. Agreeable to outpatient Palliative  25 minutes Quinn Axe, NP Palliative Medicine Team Team Phone # [phone] Greater than 50% of this time was spent counseling and coordinating care related to the above assessment and plan.

## 2019-04-10 LAB — GLUCOSE, CAPILLARY
Glucose-Capillary: 144 mg/dL — ABNORMAL HIGH (ref 70–99)
Glucose-Capillary: 196 mg/dL — ABNORMAL HIGH (ref 70–99)
Glucose-Capillary: 129 mg/dL — ABNORMAL HIGH (ref 70–99)
Glucose-Capillary: 144 mg/dL — ABNORMAL HIGH (ref 70–99)

## 2019-04-10 MED ORDER — MORPHINE SULFATE (CONCENTRATE) 10 MG/0.5ML PO SOLN
2.6000 mg | ORAL | Status: DC
  Administered 2019-04-10 (×3): 5 mg via ORAL
  Filled 2019-04-10 (×4): qty 1

## 2019-04-10 MED ORDER — BENZONATATE 100 MG PO CAPS
100.0000 mg | ORAL_CAPSULE | Freq: Two times a day (BID) | ORAL | Status: DC | PRN
  Administered 2019-04-10 – 2019-04-12 (×4): 100 mg via ORAL
  Filled 2019-04-10 (×4): qty 1

## 2019-04-10 NOTE — Progress Notes (Signed)
[facility] [place] NAME: [name]    MR#:  [id number]  DATE OF BIRTH:  [birth year]  SUBJECTIVE:   Chief Complaint  Patient presents with  . Respiratory Distress   Patient is seen at the bedside. He is still very SOB with minimal exertion. No significant changes from prior assessment.  REVIEW OF SYSTEMS:  ROS Constitutional: Negative for chills, fever, malaise/fatigue and weight loss.  HENT: Negative for congestion, hearing loss and sore throat.   Eyes: Negative for blurred vision and double vision.  Respiratory: Positive for cough, sputum production, shortness of breath and wheezing.   Cardiovascular: Positive for orthopnea and leg swelling. Negative for chest pain and palpitations.  Gastrointestinal: Negative for abdominal pain, diarrhea, nausea and vomiting.  Genitourinary: Negative for dysuria and urgency.  Musculoskeletal: Positive for back pain. Negative for myalgias.  Skin: Negative for rash.  Neurological: Negative for dizziness, sensory change, speech change, focal weakness and headaches.  Psychiatric/Behavioral: Positive for depression. The patient is nervous/anxious and has insomnia.   DRUG ALLERGIES:   Allergies  Allergen Reactions  . Morphine And Related Shortness Of Breath, Nausea And Vomiting, Rash and Other (See Comments)    Reaction:  Hallucinations   . Penicillins Anaphylaxis, Hives and Other (See Comments)    10/02/18 - discussed with patient and he states he can tolerate penicillin capsules and had some hives when he was 78yo and received pcn injections  Has patient had a PCN reaction causing immediate rash, facial/tongue/throat swelling, SOB or lightheadedness with hypotension: Yes Has patient had a PCN reaction causing severe rash involving mucus membranes or skin necrosis: No Has patient had a PCN reaction that required hospitalization No Has patient had a PCN reaction occurring within the last 10 y  .  Zolpidem Shortness Of Breath  . Demerol [Meperidine] Other (See Comments)    Reaction:  Hallucinations    . Dilaudid [Hydromorphone Hcl] Other (See Comments)    Reaction:  Hallucinations   . Levofloxacin Other (See Comments)    Reaction:  Unknown    VITALS:  Blood pressure 125/69, pulse 84, temperature 97.6 F (36.4 C), temperature source Oral, resp. rate 20, height 6\' 3"  (1.905 m), weight 126.6 kg, SpO2 94 %. PHYSICAL EXAMINATION:   GENERAL:  78 y.o.-year-old patient lying in the bed with no acute distress. He is however SOB EYES: Pupils equal, round, reactive to light and accommodation. No scleral icterus. Extraocular muscles intact.  HEENT: Head atraumatic, normocephalic. Oropharynx and nasopharynx clear.  NECK:  Supple, no jugular venous distention. No thyroid enlargement, no tenderness.  LUNGS: Decreased breath sounds bilaterally, mild wheezing, no rales,rhonchi or crepitation. No use of accessory muscles of respiration.  CARDIOVASCULAR: S1, S2 normal. No murmurs, rubs, or gallops.  ABDOMEN: Soft, nontender, nondistended. Bowel sounds present. No organomegaly or mass.  EXTREMITIES: Bilateral pitting edema Right>left , cyanosis, or clubbing. No rash or lesions. + pedal pulses MUSCULOSKELETAL: Normal bulk, and power was 5+ grip and elbow, knee, and ankle flexion and extension bilaterally.  NEUROLOGIC:Alert and oriented x 3. CN 2-12 intact. Sensation to light touch and cold stimuli intact bilaterally. Finger to nose nl. Babinski is downgoing. DTR's (biceps, patellar, and achilles) 2+ and symmetric throughout. Gait not tested due to safety concern. PSYCHIATRIC: The patient is alert and oriented x 3.  SKIN: No obvious rash, lesion, or ulcer.   DATA REVIEWED:  LABORATORY PANEL:  Male CBC Recent Labs  Lab 04/08/19 0459  WBC 11.3*  HGB  9.5*  HCT 31.3*  PLT 205   ------------------------------------------------------------------------------------------------------------------  Chemistries  Recent Labs  Lab 04/09/19 0615  NA 134*  K 4.1  CL 91*  CO2 33*  GLUCOSE 144*  BUN 35*  CREATININE 1.30*  CALCIUM 8.3*   RADIOLOGY:  No results found. ASSESSMENT AND PLAN:   78 y.o. male with pertinent past medical history of chronic respiratory failure, COPD, O2 dependent.  7 L at home, AAA status post endovascular repair, AVMs, anxiety, bipolar disorder, MI, CAD, CABG, dCHF, depression and anxiety, diabetes mellitus type 2, hypertension, hyperlipidemia, GERD, and chronic microcytic anemia presenting with worsening shortness of breath, and dry cough for the past 1 week.  1. Acute on chronic respiratory failure with hypoxia secondary to AECOPD /bronchitis/bronchiectasis - Presenting with daily productive cough>28months, >6 exacerbations per year requiring multiple readmission, iv antibiotic therapy. - Has been tried and failed flutter valve and other breathing techniques. - Patient will benefit from AflloVest - Chest x-ray reviewed,Left mid and lower lung zone linear, likely atelectasis?Infiltrate, likely chronicCOPD/emphysemicchanges, - Supplemental O2, goal sat 88-92%  2. 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 - management as above - Budesonide 0.5mg  nebulized BID and DuoNeb QID - Corticosteroids: Prednisone 40mg  PO daily x 5 days - Doxycycline 100mg  PO bid - Start Azithromycin 250 every other day indefinitely per pulmonary - nocturnal BiPAP for treatment of chronic hypercapnic respiratory failure - Pulmonary input appreciated - Palliative following  3.  Chronic diastolic CHF - Last Echocardiogram on 10/05/2018 was reviewed Fording ejection fraction 60-65% negative for any LVH,report of systolic or diastolic failure, but history reveals diastolic CHF - Afterload, Goal MAP <70:Hold Lisinoprildue to worsening renal function - Beta-Blockade: Metoprolol - Diuretics: Hold  Furosemide for worsening renal function.  - Low salt diet - Check daily weight - Strict I&Os  4.CAD/ with HX of CABG/MIs - ASA 81mg  PO daily - HTN, HLD, DM control as below  5.HLD = + Goal LDL<70 - Atorvastatin 10mg  PO qhs  6.HTN- stable + Goal BP <140/90 - Beta-blocker: Metoprolol - ACE-Inhibitor: Hold Lisinopril as above  7.Diabetes mellitus with complication (HCC) -check CBG QA CHS, with SSI. - continueLantus(anticipating blood sugars to run high due to steroids, will adjust with SSI  8. Iron deficiency anemia due to chronic blood loss-stable, continue iron supplements  9. Bipolar 1/major depressive disorder, recurrent, severe without psychotic features (HCC)-stable,continue home medication of Celexa, with a PRN Xanax - Clonazepam 0.5 BID PRN - Continue Celexa to 30 mg once a day - Psych input appreciated  10. Bilateral Hand tremors- may likely be due to chronic hypercarbia  All the records are reviewed and case discussed with Care Management/Social Worker. Management plans discussed with the patient, family and they are in agreement.  CODE STATUS: DNR  TOTAL TIME TAKING CARE OF THIS PATIENT: 40 minutes.   More than 50% of the time was spent in counseling/coordination of care: YES  POSSIBLE D/C IN 1 DAYS, DEPENDING ON CLINICAL CONDITION.  on 04/10/2019 at 4:10 PM  Rufina Falco, DNP, FNP-BC [facility] Nurse Practitioner Between 7am to 6pm - Pager - [phone]  After 6pm go to [email or web address] - password [email or web address] [facility]  Office  [phone]  CC: Primary care physician; [name], MD  Note: This dictation was prepared with Dragon dictation along with smaller phrase technology. Any transcriptional errors that result from this process are unintentional.

## 2019-04-10 NOTE — Progress Notes (Signed)
Patient refuses bed alarms at this time. Patient educated about safety

## 2019-04-10 NOTE — Progress Notes (Signed)
Occupational Therapy Treatment Patient Details Name: [name] MRN: [id number] DOB: [birth year] Today's Date: 04/10/2019    History of present illness Pt is a 78 year old male with acute on chronic COPD exerbation, reporting to ED with SOB. PMH: chronic respiratory failure, COPD, O2 dependent.  7 L at home, AAA status post endovascular repair, AVMs, anxiety, bipolar disorder, MI, CAD, CABG, dCHF, depression and anxiety, diabetes mellitus type 2, hypertension, hyperlipidemia, GERD, and chronic microcytic anemia admitted and being treated for COPD exacerbation. Patient reports he has not been walking at home for "a long time" but cannot quantify how long. Reports he completes ADLs IND but his wife "sets them up for him" ie brings clothes to him to put on on his own, brings food to him and he feeds self, etc.   OT comments  Pt seen for OT tx this date. Pt seated EOB with nurse tech upon arrival. Vitals WNL during session with 4L O2 via [place]. OT facilitated problem solving of daily routines and ADL tasks at home and how to incorporate learned ECS to into them to improve participation and safety. Pt required moderate redirection during session, often tangential. Pt endorsed "mild hallucination" during session of "seeing movement in room when it's not there". RN in room at end of session and notified. Pt refusing bed alarm. Pt continues to benefit from skilled OT services to maximize safety/indep and return to PLOF while minimizing risk of falls, readmission, and caregiver burden.    Follow Up Recommendations  Home health OT;Supervision - Intermittent    Equipment Recommendations  3 in 1 bedside commode;Toilet rise with handles    Recommendations for Other Services      Precautions / Restrictions Precautions Precautions: Fall Restrictions Weight Bearing Restrictions: No       Mobility Bed Mobility Overal bed mobility: Needs Assistance         Sit to supine: Supervision   General bed  mobility comments: supervision given pt reporting back spasm and moving quickly to lay back down  Transfers                      Balance Overall balance assessment: Needs assistance Sitting-balance support: No upper extremity supported;Feet unsupported Sitting balance-Leahy Scale: Fair                                     ADL either performed or assessed with clinical judgement   ADL Overall ADL's : Needs assistance/impaired   Eating/Feeding Details (indicate cue type and reason): no difficulty this date drinking from a cup, no tremors/jerking noted Grooming: Sitting;Set up                                       Vision Patient Visual Report: No change from baseline     Perception     Praxis      Cognition Arousal/Alertness: Awake/alert;Suspect due to medications Behavior During Therapy: [facility] for tasks assessed/performed Overall Cognitive Status: Within Functional Limits for tasks assessed                                 General Comments: generally alert and oriented, but with a couple brief episodes of decreased alertness with eyes closing and he would suddenly  jerk/wake back up. Pt denied issues. Did endorse hallucinating, RN in room at end of session and notified.        Exercises Other Exercises Other Exercises: OT facilitated problem solving ADL and daily routines to incorporate ECS to support participation, pt required cues to redirect to task   Shoulder Instructions       General Comments      Pertinent Vitals/ Pain       Pain Assessment: No/denies pain  Home Living                                          Prior Functioning/Environment              Frequency  Min 2X/week        Progress Toward Goals  OT Goals(current goals can now be found in the care plan section)  Progress towards OT goals: Progressing toward goals  Acute Rehab OT Goals Patient Stated Goal: To go  home OT Goal Formulation: With patient Time For Goal Achievement: 04/22/19 Potential to Achieve Goals: [place] Discharge plan remains appropriate;Frequency remains appropriate    Co-evaluation                 AM-PAC OT "6 Clicks" Daily Activity     Outcome Measure   Help from another person eating meals?: A Little Help from another person taking care of personal grooming?: A Little Help from another person toileting, which includes using toliet, bedpan, or urinal?: A Little Help from another person bathing (including washing, rinsing, drying)?: A Lot Help from another person to put on and taking off regular upper body clothing?: A Little Help from another person to put on and taking off regular lower body clothing?: A Lot 6 Click Score: 16    End of Session    OT Visit Diagnosis: Other abnormalities of gait and mobility (R26.89);Muscle weakness (generalized) (M62.81)   Activity Tolerance Patient limited by lethargy;Patient tolerated treatment well   Patient Left in bed;with call bell/phone within reach;with nursing/sitter in room(pt refused bed alarm)   Nurse Communication Other (comment)(pt reports mild hallucinations of "movement" in the room)        Time: 2353-6144 OT Time Calculation (min): 24 min  Charges: OT General Charges $OT Visit: 1 Visit OT Treatments $Self Care/Home Management : 23-37 mins  Jeni Salles, MPH, MS, OTR/L ascom [phone] 04/10/19, 12:22 PM

## 2019-04-10 NOTE — Progress Notes (Signed)
Palliative: [name] is sitting up on the edge of the bed.  He makes and keeps eye contact. We talk about his breathing and he feels that he is breathing better. We talk about continuing morphine regimen.  We also talk about bowel regimen which has been effective to this point.   Conference with RN CM related to patient condition, symptom management, and disposition.   Plan:  DC to home with [facility] services and outpatient palliative services.   13 minutes Howard Axe, NP Palliative Medicine Team Team Phone # [phone] Greater than 50% of this time was spent counseling and coordinating care related to the above assessment and plan.

## 2019-04-10 NOTE — Clinical Social Work Note (Signed)
Patient continues to exhibit signs of hypercapnia associated with chronic respiratory failure secondary to severe COPD. Patient requires the use of NIV both QHS and daytime to help with exacerbation periods. The use of the NIV will treat patient's high PCO2 levels and can reduce risk of exacerbations and future hospitalizations when used at night and during the day. Patient will need these advanced settings in conjunction with his current medication regimen: BIPAP is not an option due to its functional limitations and the severity of the patient's condition. Failure to have NIV available for use over a 24 hour period could lead to death.

## 2019-04-11 ENCOUNTER — Inpatient Hospital Stay: Payer: Medicare HMO

## 2019-04-11 DIAGNOSIS — R0681 Apnea, not elsewhere classified: Secondary | ICD-10-CM

## 2019-04-11 LAB — GLUCOSE, CAPILLARY
Glucose-Capillary: 100 mg/dL — ABNORMAL HIGH (ref 70–99)
Glucose-Capillary: 115 mg/dL — ABNORMAL HIGH (ref 70–99)
Glucose-Capillary: 179 mg/dL — ABNORMAL HIGH (ref 70–99)
Glucose-Capillary: 171 mg/dL — ABNORMAL HIGH (ref 70–99)

## 2019-04-11 MED ORDER — MORPHINE SULFATE (CONCENTRATE) 10 MG/0.5ML PO SOLN
2.6000 mg | ORAL | Status: DC
  Administered 2019-04-11 – 2019-04-12 (×3): 2.6 mg via ORAL
  Administered 2019-04-12: 10 mg via ORAL
  Administered 2019-04-13: 2.6 mg via ORAL
  Filled 2019-04-11 (×5): qty 1

## 2019-04-11 NOTE — Progress Notes (Addendum)
Palliative: [name] is walking in the hallway with physical therapy.  He is able to stop and have a short conversation, making full sentences.  He will make an somewhat keep eye contact, but continues to look chronically ill and frail. We talked about his breathlessness, the benefits of morphine.  He is tells me that he feels better with his breathing. Text chat from bedside nurse and attending related to patient's concern over morphine "making him loopy".  I returned to [name] room.  He is sitting up on the edge of the bed eating peanut butter and crackers. He will make an somewhat keep eye contact. He is able to make full sentences without signs and symptoms of shortness of breath.    We talked in detail about the benefits of morphine.  [name] tells me that he feels the benefits of the breathing treatments, Symbicort is in large portion of why he feels better.  We talked about the benefits of morphine for breathlessness, not treating COPD.  I share that the side effect of sleepiness and nausea goes away after 2 or 3 days.  [name] tells me that he was not told this, but when I share that I did tell him about side effects he tells me that he was "not myself" a few days ago.  He is agreeable to the use of morphine by mouth for breathlessness.  [name] tells me that he is going to return home, get better, no Dr. is going to tell him how long he has to live.  I asked who talked with him about prognosis, but he is unwilling to tell me instead saying "I do not want it to get back to them", as though there would be repercussions.  I encouraged him to do the best he can.  Nursing staff enters with medication.     Conference with attending NP and RN CM related to [name] agreement to the use of morphine for breathlessness, discharge plan.   Plan: Home with HH/Palliative care. Re hospitalize as needed.   Symptom management:  Morphine liquid 2.6 mg p.o./SL every 4 hours  scheduled.  Would benefit from morphine extended release tablet twice daily AND morphine liquid Q 2 hours PRN breakthrough relief, once stabilized with morphine liquid dosing.  45 minutes, extended time  Quinn Axe, NP  Palliative Medicine Team Team Phone # [phone] Greater than 50% of this time was spent counseling and coordinating care related to the above assessment and plan.

## 2019-04-11 NOTE — Progress Notes (Signed)
[facility] at [place] NAME: [name]    MR#:  [id number]  DATE OF BIRTH:  Jan 13, 1941  SUBJECTIVE:   Chief Complaint  Patient presents with  . Respiratory Distress   Patient is seen at the bedside. He is still very SOB with minimal exertion. No significant changes from prior assessment. He would like to work with PT prior to going home. He is also requesting his Symbicort inhaler.  REVIEW OF SYSTEMS:  ROS Constitutional: Negative for chills, fever, malaise/fatigue and weight loss.  HENT: Negative for congestion, hearing loss and sore throat.   Eyes: Negative for blurred vision and double vision.  Respiratory: Positive for cough, sputum production, shortness of breath and wheezing.   Cardiovascular: Positive for orthopnea and leg swelling. Negative for chest pain and palpitations.  Gastrointestinal: Negative for abdominal pain, diarrhea, nausea and vomiting.  Genitourinary: Negative for dysuria and urgency.  Musculoskeletal: Positive for back pain. Negative for myalgias.  Skin: Negative for rash.  Neurological: Negative for dizziness, sensory change, speech change, focal weakness and headaches.  Psychiatric/Behavioral: Positive for depression. The patient is nervous/anxious and has insomnia.   DRUG ALLERGIES:   Allergies  Allergen Reactions  . Morphine And Related Shortness Of Breath, Nausea And Vomiting, Rash and Other (See Comments)    Reaction:  Hallucinations   . Penicillins Anaphylaxis, Hives and Other (See Comments)    10/02/18 - discussed with patient and he states he can tolerate penicillin capsules and had some hives when he was 78yo and received pcn injections  Has patient had a PCN reaction causing immediate rash, facial/tongue/throat swelling, SOB or lightheadedness with hypotension: Yes Has patient had a PCN reaction causing severe rash involving mucus membranes or skin necrosis: No Has patient had a PCN reaction that  required hospitalization No Has patient had a PCN reaction occurring within the last 10 y  . Zolpidem Shortness Of Breath  . Demerol [Meperidine] Other (See Comments)    Reaction:  Hallucinations    . Dilaudid [Hydromorphone Hcl] Other (See Comments)    Reaction:  Hallucinations   . Levofloxacin Other (See Comments)    Reaction:  Unknown    VITALS:  Blood pressure (!) 142/86, pulse 83, temperature 98.2 F (36.8 C), temperature source Oral, resp. rate 20, height 6\' 3"  (1.905 m), weight 126.6 kg, SpO2 95 %. PHYSICAL EXAMINATION:   GENERAL:  78 y.o.-year-old patient lying in the bed with no acute distress. He is however SOB EYES: Pupils equal, round, reactive to light and accommodation. No scleral icterus. Extraocular muscles intact.  HEENT: Head atraumatic, normocephalic. Oropharynx and nasopharynx clear.  NECK:  Supple, no jugular venous distention. No thyroid enlargement, no tenderness.  LUNGS: Decreased breath sounds bilaterally, mild wheezing, no rales,rhonchi or crepitation. No use of accessory muscles of respiration.  CARDIOVASCULAR: S1, S2 normal. No murmurs, rubs, or gallops.  ABDOMEN: Soft, nontender, nondistended. Bowel sounds present. No organomegaly or mass.  EXTREMITIES: Bilateral pitting edema Right>left , cyanosis, or clubbing. No rash or lesions. + pedal pulses MUSCULOSKELETAL: Normal bulk, and power was 5+ grip and elbow, knee, and ankle flexion and extension bilaterally.  NEUROLOGIC:Alert and oriented x 3. CN 2-12 intact. Sensation to light touch and cold stimuli intact bilaterally. Finger to nose nl. Babinski is downgoing. DTR's (biceps, patellar, and achilles) 2+ and symmetric throughout. Gait not tested due to safety concern. PSYCHIATRIC: The patient is alert and oriented x 3.  SKIN: No obvious rash, lesion, or ulcer.  DATA REVIEWED:  LABORATORY PANEL:  Male CBC Recent Labs  Lab 04/08/19 0459  WBC 11.3*  HGB 9.5*  HCT 31.3*  PLT 205    ------------------------------------------------------------------------------------------------------------------ Chemistries  Recent Labs  Lab 04/09/19 0615  NA 134*  K 4.1  CL 91*  CO2 33*  GLUCOSE 144*  BUN 35*  CREATININE 1.30*  CALCIUM 8.3*   RADIOLOGY:  No results found. ASSESSMENT AND PLAN:   78 y.o. male with pertinent past medical history of chronic respiratory failure, COPD, O2 dependent.  7 L at home, AAA status post endovascular repair, AVMs, anxiety, bipolar disorder, MI, CAD, CABG, dCHF, depression and anxiety, diabetes mellitus type 2, hypertension, hyperlipidemia, GERD, and chronic microcytic anemia presenting with worsening shortness of breath, and dry cough for the past 1 week.  1. Acute on chronic respiratory failure with hypoxia secondary to AECOPD /bronchitis/bronchiectasis - Presenting with daily productive cough>55months, >6 exacerbations per year requiring multiple readmission, iv antibiotic therapy. - Has been tried and failed flutter valve and other breathing techniques. - Patient will benefit from AflloVest - Chest x-ray reviewed,Left mid and lower lung zone linear, likely atelectasis?Infiltrate, likely chronicCOPD/emphysemicchanges, - Supplemental O2, goal sat 88-92%  2. 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 - management as above - Budesonide 0.5mg  nebulized BID and DuoNeb QID - Corticosteroids: Prednisone 40mg  PO daily x 5 days - Doxycycline 100mg  PO bid - Start Azithromycin 250 every other day indefinitely per pulmonary - nocturnal BiPAP for treatment of chronic hypercapnic respiratory failure - Pulmonary input appreciated - Palliative following  3.  Chronic diastolic CHF - Last Echocardiogram on 10/05/2018 was reviewed Fording ejection fraction 60-65% negative for any LVH,report of systolic or diastolic failure, but history reveals diastolic CHF - Afterload, Goal MAP <70:Hold Lisinoprildue to  worsening renal function - Beta-Blockade: Metoprolol - Diuretics: Hold  Furosemide for worsening renal function. - Low salt diet - Check daily weight - Strict I&Os  4.CAD/ with HX of CABG/MIs - ASA 81mg  PO daily - HTN, HLD, DM control as below  5.HLD = + Goal LDL<70 - Atorvastatin 10mg  PO qhs  6.HTN- stable + Goal BP <140/90 - Beta-blocker: Metoprolol - ACE-Inhibitor: Hold Lisinopril as above  7.Diabetes mellitus with complication (HCC) -check CBG QA CHS, with SSI. - continueLantus(anticipating blood sugars to run high due to steroids, will adjust with SSI  8. Iron deficiency anemia due to chronic blood loss-stable, continue iron supplements  9. Bipolar 1/major depressive disorder, recurrent, severe without psychotic features (HCC)-stable,continue home medication of Celexa, with a PRN Xanax - Clonazepam 0.5 BID PRN - Continue Celexa to 30 mg once a day - Psych input appreciated  10. Bilateral Hand tremors- may likely be due to chronic hypercarbia  All the records are reviewed and case discussed with Care Management/Social Worker. Management plans discussed with the patient, family and they are in agreement.  CODE STATUS: DNR  TOTAL TIME TAKING CARE OF THIS PATIENT: 40 minutes.   More than 50% of the time was spent in counseling/coordination of care: YES  POSSIBLE D/C IN 1 DAYS, DEPENDING ON CLINICAL CONDITION.  on 04/11/2019 at 1:36 PM  Rufina Falco, DNP, FNP-BC [facility] Nurse Practitioner Between 7am to 6pm - Pager - [phone]  After 6pm go to [email or web address] - password [email or web address] [facility]  Office  [phone]  CC: Primary care physician; [name], MD  Note: This dictation was prepared with Dragon dictation along with smaller phrase technology. Any transcriptional errors that  result from this process are unintentional.

## 2019-04-11 NOTE — Progress Notes (Signed)
Physical Therapy Treatment Patient Details Name: [name] MRN: [id number] DOB: 1941/08/15 Today's Date: 04/11/2019    History of Present Illness Pt is a 78 year old male with acute on chronic COPD exerbation, reporting to ED with SOB. PMH: chronic respiratory failure, COPD, O2 dependent.  7 L at home, AAA status post endovascular repair, AVMs, anxiety, bipolar disorder, MI, CAD, CABG, dCHF, depression and anxiety, diabetes mellitus type 2, hypertension, hyperlipidemia, GERD, and chronic microcytic anemia admitted and being treated for COPD exacerbation. Patient reports he has not been walking at home for "a long time" but cannot quantify how long. Reports he completes ADLs IND but his wife "sets them up for him" ie brings clothes to him to put on on his own, brings food to him and he feeds self, etc.    PT Comments    Pt ready for gait.  States he's hoping to go home today.  Bed mobility without assist.  Stood and ambulated around nursing unit with walker and min guard.  Assist for O2 at 4 lpm.  Pt states "She told me to slow down last time" and would correct speed on his own.  Sats stable with gait today.  Pt voiced frustration over lack of treatment options in regards to his COPD "I know they can give me a lung transplant."  Listened and encouragement given.   Follow Up Recommendations  Home health PT     Equipment Recommendations  Rolling walker with 5" wheels    Recommendations for Other Services       Precautions / Restrictions Precautions Precautions: Fall Restrictions Weight Bearing Restrictions: No    Mobility  Bed Mobility Overal bed mobility: Modified Independent Bed Mobility: Supine to Sit;Sit to Supine   Sidelying to sit: Modified independent (Device/Increase time) Supine to sit: Modified independent (Device/Increase time)        Transfers Overall transfer level: Modified independent Equipment used: Rolling walker (2 wheeled) Transfers: Sit to/from  Stand Sit to Stand: Modified independent (Device/Increase time)            Ambulation/Gait Ambulation/Gait assistance: Supervision Gait Distance (Feet): 200 Feet Assistive device: Rolling walker (2 wheeled) Gait Pattern/deviations: Step-through pattern;Narrow base of support Gait velocity: decreased       Stairs             Wheelchair Mobility    Modified Rankin (Stroke Patients Only)       Balance Overall balance assessment: Needs assistance Sitting-balance support: No upper extremity supported;Feet unsupported Sitting balance-Leahy Scale: Good     Standing balance support: During functional activity;Bilateral upper extremity supported Standing balance-Leahy Scale: Fair Standing balance comment: Relies on walker for support                            Cognition Arousal/Alertness: Awake/alert Behavior During Therapy: WFL for tasks assessed/performed Overall Cognitive Status: Within Functional Limits for tasks assessed                                 General Comments: Alert and engaged in session.  Joking in the beginning then voiced frustration over failing health and no treatments to cure him.      Exercises      General Comments        Pertinent Vitals/Pain Pain Assessment: No/denies pain    Home Living  Prior Function            PT Goals (current goals can now be found in the care plan section) Progress towards PT goals: Progressing toward goals    Frequency    Min 2X/week      PT Plan Current plan remains appropriate    Co-evaluation              AM-PAC PT "6 Clicks" Mobility   Outcome Measure  Help needed turning from your back to your side while in a flat bed without using bedrails?: None Help needed moving from lying on your back to sitting on the side of a flat bed without using bedrails?: None Help needed moving to and from a bed to a chair (including a  wheelchair)?: None Help needed standing up from a chair using your arms (e.g., wheelchair or bedside chair)?: None Help needed to walk in hospital room?: A Little Help needed climbing 3-5 steps with a railing? : A Little 6 Click Score: 22    End of Session Equipment Utilized During Treatment: Gait belt Activity Tolerance: Patient tolerated treatment well Patient left: in bed;with call bell/phone within reach         Time: 1114-1130 PT Time Calculation (min) (ACUTE ONLY): 16 min  Charges:  $Gait Training: 8-22 mins                    Chesley Noon, PTA 04/11/19, 12:40 PM

## 2019-04-11 NOTE — Care Management Important Message (Signed)
Important Message  Patient Details  Name: [name] MRN: [id number] Date of Birth: 17-Aug-1941   Medicare Important Message Given:  Yes     Juliann Pulse A Jery Hollern 04/11/2019, 11:21 AM

## 2019-04-12 LAB — GLUCOSE, CAPILLARY
Glucose-Capillary: 99 mg/dL (ref 70–99)
Glucose-Capillary: 211 mg/dL — ABNORMAL HIGH (ref 70–99)
Glucose-Capillary: 117 mg/dL — ABNORMAL HIGH (ref 70–99)
Glucose-Capillary: 122 mg/dL — ABNORMAL HIGH (ref 70–99)

## 2019-04-12 NOTE — Progress Notes (Signed)
Physical Therapy Treatment Patient Details Name: [name] MRN: [id number] DOB: May 21, 1941 Today's Date: 04/12/2019    History of Present Illness Pt is a 78 year old male with acute on chronic COPD exerbation, reporting to ED with SOB. PMH: chronic respiratory failure, COPD, O2 dependent.  7 L at home, AAA status post endovascular repair, AVMs, anxiety, bipolar disorder, MI, CAD, CABG, dCHF, depression and anxiety, diabetes mellitus type 2, hypertension, hyperlipidemia, GERD, and chronic microcytic anemia admitted and being treated for COPD exacerbation. Patient reports he has not been walking at home for "a long time" but cannot quantify how long. Reports he completes ADLs IND but his wife "sets them up for him" ie brings clothes to him to put on on his own, brings food to him and he feeds self, etc.    PT Comments    Pt sitting EOB upon arrival, placing dinner order over phone. Pt is awake, conversive, agreeable to participate, but many of his responses are characterized as inappropriate, disrespectful, tangential, or sarcastic banter. Author finds it difficult to obtain meaningful subjective report from the patient throughout session. Pt is able to AMB 239ft on 3L/min and with RW without LOB or gait instability. He has some SOB limitations in session, but is more limited by pain in bilat hips causing a need to take several standing rest breaks. At end of session, patient affect shifts from dry sarcasm to calm agitation, repeatedly vocalizing discontent with Pryor Curia, particularly regarding communication stylings that are not to his liking. Author unable to meet all of patient's needs at end of session as patient becomes increasingly derogatory and vague in his responses, using provocative language to elicit a response from author. Per other rehab staff this is not unlike patient's typical presentation.    Follow Up Recommendations  Home health PT     Equipment Recommendations  Rolling  walker with 5" wheels    Recommendations for Other Services       Precautions / Restrictions Precautions Precautions: Fall Restrictions Weight Bearing Restrictions: No    Mobility  Bed Mobility Overal bed mobility: (received at EOB.)                Transfers Overall transfer level: Modified independent Equipment used: Rolling walker (2 wheeled) Transfers: Sit to/from Stand Sit to Stand: Modified independent (Device/Increase time)            Ambulation/Gait Ambulation/Gait assistance: Supervision;Min guard Gait Distance (Feet): 240 Feet Assistive device: Rolling walker (2 wheeled) Gait Pattern/deviations: Step-through pattern;Narrow base of support Gait velocity: 0.30m/s   General Gait Details: no LOB or gait instability but very distracted, engaging people in hallway, prioritizing interaction and being silly more than following cues for safety or RW use; stops periodically to rest 2/2 hip pain. SpO2  88% on 3L at end of AMB.   Stairs             Wheelchair Mobility    Modified Rankin (Stroke Patients Only)       Balance Overall balance assessment: Needs assistance Sitting-balance support: No upper extremity supported;Feet unsupported Sitting balance-Leahy Scale: Good     Standing balance support: During functional activity;Bilateral upper extremity supported Standing balance-Leahy Scale: Fair Standing balance comment: Relies on walker for support                            Cognition Arousal/Alertness: Awake/alert Behavior During Therapy: WFL for tasks assessed/performed;Agitated;Impulsive Overall Cognitive Status: Within Functional Limits  for tasks assessed                                 General Comments: initially tangential and impulsive language, poor filter even when comments are pointed out as disrespectful or innappropriate, then pt becomes agitated and provoking at end of session.      Exercises       General Comments        Pertinent Vitals/Pain Pain Assessment: (reports some hip pain while walking, does not rate when asked)    Home Living                      Prior Function            PT Goals (current goals can now be found in the care plan section) Acute Rehab PT Goals Patient Stated Goal: To go home Progress towards PT goals: Progressing toward goals    Frequency    Min 2X/week      PT Plan Current plan remains appropriate    Co-evaluation              AM-PAC PT "6 Clicks" Mobility   Outcome Measure  Help needed turning from your back to your side while in a flat bed without using bedrails?: None   Help needed moving to and from a bed to a chair (including a wheelchair)?: None Help needed standing up from a chair using your arms (e.g., wheelchair or bedside chair)?: None Help needed to walk in hospital room?: A Little Help needed climbing 3-5 steps with a railing? : A Little 6 Click Score: 18    End of Session Equipment Utilized During Treatment: Gait belt Activity Tolerance: Patient tolerated treatment well Patient left: in bed;with call bell/phone within reach Nurse Communication: Mobility status(refuses bed alarm) PT Visit Diagnosis: Unsteadiness on feet (R26.81);Other abnormalities of gait and mobility (R26.89);Muscle weakness (generalized) (M62.81);History of falling (Z91.81);Repeated falls (R29.6);Difficulty in walking, not elsewhere classified (R26.2)     Time: 5573-2202 PT Time Calculation (min) (ACUTE ONLY): 26 min  Charges:  $Gait Training: 23-37 mins                     4:24 PM, 04/12/19 Etta Grandchild, PT, DPT Physical Therapist - [facility]  [phone] ([place])     [name],Howard C 04/12/2019, 4:17 PM

## 2019-04-12 NOTE — Progress Notes (Signed)
   04/12/19 0900  Clinical Encounter Type  Visited With Patient  Visit Type Follow-up  Spiritual Encounters  Spiritual Needs Emotional  Stress Factors  Patient Stress Factors Health changes  Ch was rounding. Upon the visit, pt said that he was "depressed". Pt said he felt invisible and that there was no one to talk to. Pt talked about how his belief in afterlife and religious teachings were shattered after fighting in war in [place] and seeing human atrocities. Pt seemed extremely drowsy throughout the conversation and explained that his depression made him sleepy. Ch left so the pt can get rest. Pt hoped for further visits. Ch will follow up in the afternoon to encourage the pt to share more about his struggles and to help him with his loneliness.

## 2019-04-12 NOTE — Progress Notes (Signed)
[place] [facility] [place] NAME: [name]    MR#:  [id number]  DATE OF BIRTH:  1941-08-09  SUBJECTIVE:   Chief Complaint  Patient presents with   Respiratory Distress   Patient is seen at the bedside. He is still very SOB with minimal exertion. He appears lethargic and states he just does not feel well. He voices concerns about going home as he feels that he is not ready for discharge. States " I know I am not going to get better".  REVIEW OF SYSTEMS:  ROS Constitutional: Negative for chills, fever, malaise/fatigue and weight loss.  HENT: Negative for congestion, hearing loss and sore throat.   Eyes: Negative for blurred vision and double vision.  Respiratory: Positive for cough, sputum production, shortness of breath and wheezing.   Cardiovascular: Positive for orthopnea and leg swelling. Negative for chest pain and palpitations.  Gastrointestinal: Negative for abdominal pain, diarrhea, nausea and vomiting.  Genitourinary: Negative for dysuria and urgency.  Musculoskeletal: Positive for back pain. Negative for myalgias.  Skin: Negative for rash.  Neurological: Negative for dizziness, sensory change, speech change, focal weakness and headaches.  Psychiatric/Behavioral: Positive for depression. The patient is nervous/anxious and has insomnia.   DRUG ALLERGIES:   Allergies  Allergen Reactions   Morphine And Related Shortness Of Breath, Nausea And Vomiting, Rash and Other (See Comments)    Reaction:  Hallucinations    Penicillins Anaphylaxis, Hives and Other (See Comments)    10/02/18 - discussed with patient and he states he can tolerate penicillin capsules and had some hives when he was 78yo and received pcn injections  Has patient had a PCN reaction causing immediate rash, facial/tongue/throat swelling, SOB or lightheadedness with hypotension: Yes Has patient had a PCN reaction causing severe rash involving mucus membranes or skin  necrosis: No Has patient had a PCN reaction that required hospitalization No Has patient had a PCN reaction occurring within the last 10 y   Zolpidem Shortness Of Breath   Demerol [Meperidine] Other (See Comments)    Reaction:  Hallucinations     Dilaudid [Hydromorphone Hcl] Other (See Comments)    Reaction:  Hallucinations    Levofloxacin Other (See Comments)    Reaction:  Unknown    VITALS:  Blood pressure (!) 114/52, pulse 84, temperature 98.1 F (36.7 C), temperature source Oral, resp. rate (!) 23, height 6\' 3"  (1.905 m), weight 126.6 kg, SpO2 97 %. PHYSICAL EXAMINATION:   GENERAL:  78 y.o.-year-old patient lying in the bed with no acute distress. He is however SOB EYES: Pupils equal, round, reactive to light and accommodation. No scleral icterus. Extraocular muscles intact.  HEENT: Head atraumatic, normocephalic. Oropharynx and nasopharynx clear.  NECK:  Supple, no jugular venous distention. No thyroid enlargement, no tenderness.  LUNGS: Decreased breath sounds bilaterally, mild wheezing, no rales,rhonchi or crepitation. No use of accessory muscles of respiration.  CARDIOVASCULAR: S1, S2 normal. No murmurs, rubs, or gallops.  ABDOMEN: Soft, nontender, nondistended. Bowel sounds present. No organomegaly or mass.  EXTREMITIES: Bilateral pitting edema Right>left , cyanosis, or clubbing. No rash or lesions. + pedal pulses MUSCULOSKELETAL: Normal bulk, and power was 5+ grip and elbow, knee, and ankle flexion and extension bilaterally.  NEUROLOGIC:Alert and oriented x 3. CN 2-12 intact. Sensation to light touch and cold stimuli intact bilaterally. Finger to nose nl. Babinski is downgoing. DTR's (biceps, patellar, and achilles) 2+ and symmetric throughout. Gait not tested due to safety concern. PSYCHIATRIC: The patient is  alert and oriented x 3.  SKIN: No obvious rash, lesion, or ulcer.   DATA REVIEWED:  LABORATORY PANEL:  Male CBC Recent Labs  Lab 04/08/19 0459  WBC 11.3*    HGB 9.5*  HCT 31.3*  PLT 205   ------------------------------------------------------------------------------------------------------------------ Chemistries  Recent Labs  Lab 04/09/19 0615  NA 134*  K 4.1  CL 91*  CO2 33*  GLUCOSE 144*  BUN 35*  CREATININE 1.30*  CALCIUM 8.3*   RADIOLOGY:  No results found. ASSESSMENT AND PLAN:   78 y.o. male with pertinent past medical history of chronic respiratory failure, COPD, O2 dependent.  7 L at home, AAA status post endovascular repair, AVMs, anxiety, bipolar disorder, MI, CAD, CABG, dCHF, depression and anxiety, diabetes mellitus type 2, hypertension, hyperlipidemia, GERD, and chronic microcytic anemia presenting with worsening shortness of breath, and dry cough for the past 1 week.  1. Acute on chronic respiratory failure with hypoxia secondary to AECOPD /bronchitis/bronchiectasis - Presenting with daily productive cough>105months, >6 exacerbations per year requiring multiple readmission, iv antibiotic therapy. - Chest x-ray reviewed,Left mid and lower lung zone linear, likely atelectasis?Infiltrate, likely chronicCOPD/emphysemicchanges, - Supplemental O2, goal sat 88-92%  2. 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 - management as above - Budesonide 0.5mg  nebulized BID and DuoNeb QID - Corticosteroids: Prednisone 40mg  PO daily x 5 days - Doxycycline 100mg  PO bid - Azithromycin 250 every other day indefinitely per pulmonary - nocturnal BiPAP for treatment of chronic hypercapnic respiratory failure - Pulmonary input appreciated - Palliative following, plan to dc home with HH/palliative care  3.  Chronic diastolic CHF - Last Echocardiogram on 10/05/2018 was reviewed Fording ejection fraction 60-65% negative for any LVH,report of systolic or diastolic failure, but history reveals diastolic CHF - Afterload, Goal MAP <70:Hold Lisinoprildue to worsening renal function - Beta-Blockade:  Metoprolol - Diuretics: Hold  Furosemide for worsening renal function. - Low salt diet - Check daily weight - Strict I&Os  4.CAD/ with HX of CABG/MIs - ASA 81mg  PO daily - HTN, HLD, DM control as below  5.HLD = + Goal LDL<70 - Atorvastatin 10mg  PO qhs  6.HTN- stable + Goal BP <140/90 - Beta-blocker: Metoprolol - ACE-Inhibitor: Hold Lisinopril as above  7.Diabetes mellitus with complication (HCC) -check CBG QA CHS, with SSI. - continueLantus(anticipating blood sugars to run high due to steroids, will adjust with SSI  8. Iron deficiency anemia due to chronic blood loss-stable, continue iron supplements  9. Bipolar 1/major depressive disorder, recurrent, severe without psychotic features (HCC)-stable,continue home medication of Celexa, with a PRN Xanax - Clonazepam 0.5 BID PRN - Continue Celexa to 30 mg once a day - Psych input appreciated  10. Bilateral Hand tremors- may likely be due to chronic hypercarbia  All the records are reviewed and case discussed with Care Management/Social Worker. Management plans discussed with the patient, family and they are in agreement.  CODE STATUS: DNR  TOTAL TIME TAKING CARE OF THIS PATIENT: 40 minutes.   More than 50% of the time was spent in counseling/coordination of care: YES  POSSIBLE D/C IN 1 DAYS, DEPENDING ON CLINICAL CONDITION.  on 04/12/2019 at 2:00 PM  Rufina Falco, DNP, FNP-BC [facility] Nurse Practitioner Between 7am to 6pm - Pager - [phone]  After 6pm go to [email or web address] - password [email or web address] [facility]  Office  [phone]  CC: Primary care physician; [name], MD  Note: This dictation was prepared with Dragon dictation along with smaller phrase technology.  Any transcriptional errors that result from this process are unintentional.

## 2019-04-12 NOTE — Progress Notes (Signed)
Sent message to Dr. Stark Klein regarding patient stating " I don't feel right". He is unable to describe what is wrong with him. Vitals are stable, blood sugar 99, not eating his breakfast. At this time holding 60 units of lantus. Patient is lethargic. Will continue to monitor.

## 2019-04-13 LAB — CBC WITH DIFFERENTIAL/PLATELET
Abs Immature Granulocytes: 0.49 10*3/uL — ABNORMAL HIGH (ref 0.00–0.07)
Basophils Absolute: 0.1 10*3/uL (ref 0.0–0.1)
Basophils Relative: 1 %
Eosinophils Absolute: 0.2 10*3/uL (ref 0.0–0.5)
Eosinophils Relative: 2 %
HCT: 32.9 % — ABNORMAL LOW (ref 39.0–52.0)
Hemoglobin: 9.9 g/dL — ABNORMAL LOW (ref 13.0–17.0)
Immature Granulocytes: 5 %
Lymphocytes Relative: 9 %
Lymphs Abs: 0.9 10*3/uL (ref 0.7–4.0)
MCH: 29 pg (ref 26.0–34.0)
MCHC: 30.1 g/dL (ref 30.0–36.0)
MCV: 96.5 fL (ref 80.0–100.0)
Monocytes Absolute: 0.8 10*3/uL (ref 0.1–1.0)
Monocytes Relative: 8 %
Neutro Abs: 7.8 10*3/uL — ABNORMAL HIGH (ref 1.7–7.7)
Neutrophils Relative %: 75 %
Platelets: 192 10*3/uL (ref 150–400)
RBC: 3.41 MIL/uL — ABNORMAL LOW (ref 4.22–5.81)
RDW: 16.2 % — ABNORMAL HIGH (ref 11.5–15.5)
WBC: 10.2 10*3/uL (ref 4.0–10.5)
nRBC: 0 % (ref 0.0–0.2)

## 2019-04-13 LAB — BASIC METABOLIC PANEL
Anion gap: 8 (ref 5–15)
BUN: 24 mg/dL — ABNORMAL HIGH (ref 8–23)
CO2: 31 mmol/L (ref 22–32)
Calcium: 8.3 mg/dL — ABNORMAL LOW (ref 8.9–10.3)
Chloride: 97 mmol/L — ABNORMAL LOW (ref 98–111)
Creatinine, Ser: 1.11 mg/dL (ref 0.61–1.24)
GFR calc Af Amer: >60 mL/min (ref >60)
GFR calc non Af Amer: >60 mL/min (ref >60)
Glucose, Bld: 171 mg/dL — ABNORMAL HIGH (ref 70–99)
Potassium: 4.6 mmol/L (ref 3.5–5.1)
Sodium: 136 mmol/L (ref 135–145)

## 2019-04-13 LAB — GLUCOSE, CAPILLARY: Glucose-Capillary: 110 mg/dL — ABNORMAL HIGH (ref 70–99)

## 2019-04-13 NOTE — TOC Transition Note (Signed)
Transition of Care [facility]) - CM/SW Discharge Note   Patient Details  Name: [name] MRN: [id number] Date of Birth: [birth year]  [facility] CM/SW Contact:  Latanya Maudlin, RN Phone Number: 04/13/2019, 9:55 AM   Clinical Narrative:  Patient to be discharged per MD order. Orders in place for home health services. Patient previously set up with [facility], notified Corene Cornea of discharge. [facility] has set up all needed DME, Brad aware. Patient on chronic O2. Family to transport.      Final next level of care: [place] Barriers to Discharge: No Barriers Identified   Patient Goals and CMS Choice Patient states their goals for this hospitalization and ongoing recovery are:: Patient wants to be at home, he does not want to go to a facility, he wants to be in the home he has lived in for 27 years CMS Medicare.gov Compare Post Acute Care list provided to:: Patient Choice offered to / list presented to : Patient, Spouse  Discharge Placement                       Discharge Plan and Services In-house Referral: Clinical Social Work Discharge Planning Services: CM Consult Post Acute Care Choice: Home Health, Durable Medical Equipment          DME Arranged: NIV, Other see comment([employer] vest) DME Agency: [facility] Date DME Agency Contacted: 04/13/19 Time DME Agency Contacted: [phone] Representative spoke with at DME Agency: [place]: RN, PT, Nurse's Aide [place] Agency: [place] ([place]) Date [place]: 04/13/19 Time [place]: [phone] Representative spoke with at [place]: [place] ([place]) Interventions     Readmission Risk Interventions Readmission Risk Prevention Plan 04/13/2019  Transportation Screening Complete  Medication Review Press photographer) Complete  PCP or Specialist appointment within 3-5 days of discharge Complete  HRI or [place] Complete  SW Recovery  Care/Counseling Consult Complete  Palliative Care Screening Patient Refused  [place] Patient Refused  Some recent data might be hidden

## 2019-04-13 NOTE — Progress Notes (Signed)
MD order received in [facility] to discharge pt home with home health today; Care Management previously established home health RN, PT and NT services with [place]; [facility] with DME; verbally reviewed AVS with pt, no new Rxs; pt did not want the out of facility DNR form, verbalized "tear it up" when an explanation was given as to what the DNR meant, form torn up and placed in the shredder at the nurse's station; no further questions voiced at this time; pt's discharge pending arrival of his wife's arrival at the [place] entrance; verbally instructed the pt to call when his wife was here in order to take him to the medical mall entrance

## 2019-04-13 NOTE — Progress Notes (Signed)
Spoke with patient about trying to wear Bipap tonight. Pt continues to refuse. Bipap is no longer in the room but made pt aware it is available.

## 2019-04-13 NOTE — Progress Notes (Signed)
Pt's wife present at the medical mall entrance; pt discharged via wheelchair on 2L [place] portable O2 by nursing to the medical mall entrance

## 2019-04-13 NOTE — Discharge Summary (Signed)
[facility] NAME: [name]    MR#:  [id number]  DATE OF BIRTH:  31-May-1941  DATE OF ADMISSION:  04/06/2019 ADMITTING PHYSICIAN: Loletha Grayer, MD  DATE OF DISCHARGE: 04/13/2019  1:00 PM  PRIMARY CARE PHYSICIAN: [name], MD    ADMISSION DIAGNOSIS:  COPD exacerbation ([place]) [J44.1]  DISCHARGE DIAGNOSIS:  Active Problems:   COPD with acute exacerbation ([place])   Goals of care, counseling/discussion   Palliative care by specialist   Encounter for hospice care discussion   Breathlessness   SECONDARY DIAGNOSIS:   Past Medical History:  Diagnosis Date  . AAA (abdominal aortic aneurysm) ([place])    a. 12/2008 s/p 7cm, endovascular repair with coiling right hypogastric artery   . Acute [place] ulcer   . Adenomatous duodenal polyp   . Allergic rhinitis, cause unspecified   . Anxiety   . AVM (arteriovenous malformation) of colon with hemorrhage   . Bipolar 1 disorder, mixed, moderate ([place]) 04/16/2015  . CAD (coronary artery disease)    a. 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  . Chronic diastolic CHF (congestive heart failure) ([place])    a. 04/2015 Echo: EF 55-60%, no rwma, Gr 1 DD, mild AI.  Marland Kitchen Complication of anesthesia    "if they sedate me for too long, they have to intubate me; then they can't get me to come out of it" (04/03/2017)  . COPD (chronic obstructive pulmonary disease) ([place])    a. GOLD stage IV, started home O2. Severe bullous disease of LUL. Prolonged intubation after surgeries due to COPD.  Marland Kitchen Depression with anxiety 01/14/2013  . Diabetes mellitus with complication ([place])   . Diverticulosis   . Duodenal diverticulum   . Duodenal ulcer   . Emphysema of lung ([place])   . Esophagitis   . Essential hypertension 08/18/2009   Qualifier: Diagnosis of  By: Doy Mince LPN, Megan    . GERD (gastroesophageal reflux disease)   . GI bleed requiring more than 4 units of blood in 24 hours, ICU, or  surgery    a. Hx bleeding gastric polyps, cecal & sigmoid AVMS s/p APC 03/30/14  . Hiatal hernia    large  . History of blood transfusion    "many many many; related to blood loss; anemia"  . Hyperlipidemia   . Insomnia 08/10/2014  . Leucocytosis 12/04/2013  . Major depressive disorder   . Memory loss   . Morbid obesity ([place])   . Multiple gastric polyps   . Myocardial infarction ([name])    "I think I had a minor one when I had the OHS"  . On home oxygen therapy    "7 liters [place] w/oxigenator" (04/03/2017)  . Pneumonia 2017  . Recurrent Microcytic Anemia    a. presumed chronic GI blood loss.  . Type II diabetes mellitus ([place])   . Vitamin D deficiency 08/10/2014    HOSPITAL COURSE:   78 y.o. male with pertinent past medical history ofchronic respiratory failure, COPD, O2 dependent. 7 L at home, AAA status post endovascular repair, AVMs, anxiety, bipolar disorder, MI, CAD, CABG, dCHF, depression and anxiety, diabetes mellitus type 2, hypertension, hyperlipidemia, GERD, and chronic microcytic anemia presenting with worsening shortness of breath, and dry cough for the past 1 week.  1.Acute on chronic respiratory failure with hypoxiasecondary to AECOPD /bronchitis/bronchiectasis - Presenting with daily productive cough>9months, >6 exacerbations per year requiring multiple readmission, iv antibiotic therapy. - Chest x-ray reviewed,Left mid and  lower lung zone linear, likely atelectasis?Infiltrate, likely chronicCOPD/emphysemicchanges -Much improved and patient's bronchospasm and wheezing has improved.  Patient is on his baseline oxygen level and therefore being discharged home.  2.Acute on Chronicsevered COPD- patient with hx of COPDuseshome oxygen at7L -Patient was treated with Pulmicort nebs, scheduled duo nebs and empiric steroids and antibiotics and has improved. - Currently not having any wheezing or bronchospasm and is being discharged home.  Patient is being arranged for  home noninvasive ventilator and will continue his maintenance inhalers and DuoNeb's as stated below. - Palliative following, plan to dc home with HH/palliative care  3.Chronic diastolic CHF -agree patient is not in congestive heart failure presently. - He was strongly advised on low-salt diet and being compliant with it. -Continue Lasix, metoprolol, lisinopril.  4.CAD/ with HX of CABG/MIs - ASA 81mg  PO daily - HTN, HLD, DM control as below  5.HLD = + Goal LDL<70 - Atorvastatin10mg  PO qhs  6.HTN- stable + Goal BP <140/90 - cont. Metoprolol, Lisinopril.   7.Diabetes mellitus with complication (HCC) - cont. Lantus, Novolog with meals.   8.Bipolar 1/major depressive disorder, recurrent, severe without psychotic features (HCC)-stable,continue home medication of Celexa, with a PRN Xanax - Clonazepam 0.5 BID PRN - Continue Celexa to 30 mg once a day - Psych input appreciated  Discharge home with Home Health  DISCHARGE CONDITIONS:   Stable.   CONSULTS OBTAINED:  Treatment Team:  Neville Route, MD  DRUG ALLERGIES:   Allergies  Allergen Reactions  . Morphine And Related Shortness Of Breath, Nausea And Vomiting, Rash and Other (See Comments)    Reaction:  Hallucinations   . Penicillins Anaphylaxis, Hives and Other (See Comments)    10/02/18 - discussed with patient and he states he can tolerate penicillin capsules and had some hives when he was 78yo and received pcn injections  Has patient had a PCN reaction causing immediate rash, facial/tongue/throat swelling, SOB or lightheadedness with hypotension: Yes Has patient had a PCN reaction causing severe rash involving mucus membranes or skin necrosis: No Has patient had a PCN reaction that required hospitalization No Has patient had a PCN reaction occurring within the last 10 y  . Zolpidem Shortness Of Breath  . Demerol [Meperidine] Other (See Comments)    Reaction:  Hallucinations    . Dilaudid  [Hydromorphone Hcl] Other (See Comments)    Reaction:  Hallucinations   . Levofloxacin Other (See Comments)    Reaction:  Unknown     DISCHARGE MEDICATIONS:   Allergies as of 04/13/2019      Reactions   Morphine And Related Shortness Of Breath, Nausea And Vomiting, Rash, Other (See Comments)   Reaction:  Hallucinations    Penicillins Anaphylaxis, Hives, Other (See Comments)   10/02/18 - discussed with patient and he states he can tolerate penicillin capsules and had some hives when he was 78yo and received pcn injections Has patient had a PCN reaction causing immediate rash, facial/tongue/throat swelling, SOB or lightheadedness with hypotension: Yes Has patient had a PCN reaction causing severe rash involving mucus membranes or skin necrosis: No Has patient had a PCN reaction that required hospitalization No Has patient had a PCN reaction occurring within the last 10 y   Zolpidem Shortness Of Breath   Demerol [meperidine] Other (See Comments)   Reaction:  Hallucinations     Dilaudid [hydromorphone Hcl] Other (See Comments)   Reaction:  Hallucinations    Levofloxacin Other (See Comments)   Reaction:  Unknown  Medication List    STOP taking these medications   tiotropium 18 MCG inhalation capsule Commonly known as: SPIRIVA     TAKE these medications   acetaminophen 325 MG tablet Commonly known as: TYLENOL Take 2 tablets (650 mg total) by mouth every 6 (six) hours as needed for mild pain.   albuterol 108 (90 Base) MCG/ACT inhaler Commonly known as: VENTOLIN HFA Inhale 2 puffs into the lungs every 6 (six) hours as needed for wheezing or shortness of breath.   albuterol (2.5 MG/3ML) 0.083% nebulizer solution Commonly known as: PROVENTIL Inhale 3 mLs (2.5 mg total) into the lungs every 2 (two) hours as needed for wheezing or shortness of breath.   ALPRAZolam 0.25 MG tablet Commonly known as: XANAX Take 1 tablet (0.25 mg total) by mouth 3 (three) times daily as needed  for anxiety or sleep. What changed: when to take this   atorvastatin 10 MG tablet Commonly known as: LIPITOR Take 1 tablet (10 mg total) by mouth daily at 6 PM.   citalopram 40 MG tablet Commonly known as: CELEXA Take 0.5 tablets (20 mg total) by mouth daily. What changed: how much to take   [employer] Melatonin 5 MG Tabs Generic drug: Melatonin Take 5 mg by mouth every evening. For sleep   docusate sodium 100 MG capsule Commonly known as: COLACE Take 1 capsule (100 mg total) by mouth 2 (two) times daily.   ferrous sulfate 325 (65 FE) MG tablet Take 1 tablet (325 mg total) by mouth 3 (three) times daily with meals.   fluticasone 50 MCG/ACT nasal spray Commonly known as: FLONASE Place 2 sprays into both nostrils daily.   furosemide 20 MG tablet Commonly known as: LASIX Take 3 tablets (60 mg total) by mouth daily.   gabapentin 300 MG capsule Commonly known as: NEURONTIN Take 2 capsules (600 mg total) by mouth 3 (three) times daily. What changed: when to take this   insulin aspart 100 UNIT/ML injection Commonly known as: novoLOG Inject 25 Units into the skin 3 (three) times daily with meals.   insulin glargine 100 UNIT/ML injection Commonly known as: LANTUS Inject 0.6 mLs (60 Units total) into the skin 2 (two) times daily.   ipratropium-albuterol 0.5-2.5 (3) MG/3ML Soln Commonly known as: DUONEB He is used 3 times daily for next 5 days, then 3 times daily as needed What changed:   how much to take  how to take this  when to take this  reasons to take this   lisinopril 2.5 MG tablet Commonly known as: ZESTRIL Take 1 tablet (2.5 mg total) by mouth daily.   loratadine 10 MG tablet Commonly known as: CLARITIN Take 1 tablet (10 mg total) by mouth daily.   metoprolol tartrate 25 MG tablet Commonly known as: LOPRESSOR Take 0.5 tablets (12.5 mg total) by mouth 2 (two) times daily.   pantoprazole 40 MG tablet Commonly known as: PROTONIX Take 1 tablet (40 mg total)  by mouth 2 (two) times daily.   Symbicort 160-4.5 MCG/ACT inhaler Generic drug: budesonide-formoterol Inhale 2 puffs into the lungs 2 (two) times daily.         DISCHARGE INSTRUCTIONS:   DIET:  Cardiac diet and Diabetic diet  DISCHARGE CONDITION:  Stable  ACTIVITY:  Activity as tolerated  OXYGEN:  Home Oxygen: Yes.     Oxygen Delivery: 5 liters/min via Patient connected to nasal cannula oxygen  DISCHARGE LOCATION:  Home with Home Health PT, RN, Aide.    If you experience worsening of  your admission symptoms, develop shortness of breath, life threatening emergency, suicidal or homicidal thoughts you must seek medical attention immediately by calling 911 or calling your MD immediately  if symptoms less severe.  You Must read complete instructions/literature along with all the possible adverse reactions/side effects for all the Medicines you take and that have been prescribed to you. Take any new Medicines after you have completely understood and accpet all the possible adverse reactions/side effects.   Please note  You were cared for by a hospitalist during your hospital stay. If you have any questions about your discharge medications or the care you received while you were in the hospital after you are discharged, you can call the unit and asked to speak with the hospitalist on call if the hospitalist that took care of you is not available. Once you are discharged, your primary care physician will handle any further medical issues. Please note that NO REFILLS for any discharge medications will be authorized once you are discharged, as it is imperative that you return to your primary care physician (or establish a relationship with a primary care physician if you do not have one) for your aftercare needs so that they can reassess your need for medications and monitor your lab values.     Today   Patient sitting up in bed no acute distress.  No other acute events overnight.   Patient feels depressed due to his chronic conditions which are irreversible including his COPD and CHF.  VITAL SIGNS:  Blood pressure 117/72, pulse 81, temperature 98.5 F (36.9 C), resp. rate 14, height 6\' 3"  (1.905 m), weight 126.6 kg, SpO2 94 %.  I/O:    Intake/Output Summary (Last 24 hours) at 04/13/2019 1551 Last data filed at 04/13/2019 1003 Gross per 24 hour  Intake -  Output 2550 ml  Net -2550 ml    PHYSICAL EXAMINATION:  GENERAL:  78 y.o.-year-old obese patient sitting up in bed in no acute distress.  EYES: Pupils equal, round, reactive to light and accommodation. No scleral icterus. Extraocular muscles intact.  HEENT: Head atraumatic, normocephalic. Oropharynx and nasopharynx clear.  NECK:  Supple, no jugular venous distention. No thyroid enlargement, no tenderness.  LUNGS: Prolonged Insp & Exp. effort, no wheezing, rales,rhonchi. No use of accessory muscles of respiration.  CARDIOVASCULAR: S1, S2 normal. No murmurs, rubs, or gallops.  ABDOMEN: Soft, non-tender, non-distended. Bowel sounds present. No organomegaly or mass.  EXTREMITIES: +1-2 edema b/l,  No cyanosis, or clubbing.  NEUROLOGIC: Cranial nerves II through XII are intact. No focal motor or sensory defecits b/l.  PSYCHIATRIC: The patient is alert and oriented x 3. SKIN: No obvious rash, lesion, or ulcer.   DATA REVIEW:   CBC Recent Labs  Lab 04/13/19 0435  WBC 10.2  HGB 9.9*  HCT 32.9*  PLT 192    Chemistries  Recent Labs  Lab 04/13/19 0435  NA 136  K 4.6  CL 97*  CO2 31  GLUCOSE 171*  BUN 24*  CREATININE 1.11  CALCIUM 8.3*    Cardiac Enzymes No results for input(s): TROPONINI in the last 168 hours.  Microbiology Results  Results for orders placed or performed during the hospital encounter of 04/06/19  SARS Coronavirus 2 (CEPHEID- Performed in [facility] hospital lab), Hosp Order     Status: None   Collection Time: 04/06/19  6:08 AM   Specimen: Nasopharyngeal Swab  Result Value Ref  Range Status   SARS Coronavirus 2 NEGATIVE NEGATIVE Final    Comment: (  NOTE) If result is NEGATIVE SARS-CoV-2 target nucleic acids are NOT DETECTED. The SARS-CoV-2 RNA is generally detectable in upper and lower  respiratory specimens during the acute phase of infection. The lowest  concentration of SARS-CoV-2 viral copies this assay can detect is 250  copies / mL. A negative result does not preclude SARS-CoV-2 infection  and should not be used as the sole basis for treatment or other  patient management decisions.  A negative result may occur with  improper specimen collection / handling, submission of specimen other  than nasopharyngeal swab, presence of viral mutation(s) within the  areas targeted by this assay, and inadequate number of viral copies  (<250 copies / mL). A negative result must be combined with clinical  observations, patient history, and epidemiological information. If result is POSITIVE SARS-CoV-2 target nucleic acids are DETECTED. The SARS-CoV-2 RNA is generally detectable in upper and lower  respiratory specimens dur ing the acute phase of infection.  Positive  results are indicative of active infection with SARS-CoV-2.  Clinical  correlation with patient history and other diagnostic information is  necessary to determine patient infection status.  Positive results do  not rule out bacterial infection or co-infection with other viruses. If result is PRESUMPTIVE POSTIVE SARS-CoV-2 nucleic acids MAY BE PRESENT.   A presumptive positive result was obtained on the submitted specimen  and confirmed on repeat testing.  While 2019 novel coronavirus  (SARS-CoV-2) nucleic acids may be present in the submitted sample  additional confirmatory testing may be necessary for epidemiological  and / or clinical management purposes  to differentiate between  SARS-CoV-2 and other Sarbecovirus currently known to infect humans.  If clinically indicated additional testing with an  alternate test  methodology [phone]) is advised. The SARS-CoV-2 RNA is generally  detectable in upper and lower respiratory sp ecimens during the acute  phase of infection. The expected result is Negative. Fact Sheet for Patients:  StrictlyIdeas.no Fact Sheet for Healthcare Providers: BankingDealers.co.za This test is not yet approved or cleared by the [place] FDA and has been authorized for detection and/or diagnosis of SARS-CoV-2 by FDA under an Emergency Use Authorization (EUA).  This EUA will remain in effect (meaning this test can be used) for the duration of the COVID-19 declaration under Section 564(b)(1) of the Act, 21 U.S.C. section 360bbb-3(b)(1), unless the authorization is terminated or revoked sooner. Performed at [facility], [address], [place], [place] [address]     RADIOLOGY:  No results found.    Management plans discussed with the patient, family and they are in agreement.  CODE STATUS:     Code Status Orders  (From admission, onward)         Start     Ordered   04/07/19 1229  Do not attempt resuscitation (DNR)  Continuous    Question Answer Comment  In the event of cardiac or respiratory ARREST Do not call a "code blue"   In the event of cardiac or respiratory ARREST Do not perform Intubation, CPR, defibrillation or ACLS   In the event of cardiac or respiratory ARREST Use medication by any route, position, wound care, and other measures to relive pain and suffering. May use oxygen, suction and manual treatment of airway obstruction as needed for comfort.      04/07/19 1229       TOTAL TIME TAKING CARE OF THIS PATIENT: 40 minutes.    Henreitta Leber M.D on 04/13/2019 at 3:51 PM  Between 7am to  6pm - Pager - [phone]  After 6pm go to [email or web address] - [employer] [place]  Office  [phone]  CC: Primary care physician; [name], MD

## 2019-04-16 ENCOUNTER — Telehealth: Payer: Self-pay | Admitting: Adult Health Nurse Practitioner

## 2019-04-16 NOTE — Telephone Encounter (Signed)
Called patient's wife [name] to schedule Palliative consult, no answer.  Left message with reason for call along with my name and contact number.

## 2019-04-24 ENCOUNTER — Telehealth: Payer: Self-pay

## 2019-04-24 NOTE — Telephone Encounter (Signed)
Called patient to set up f/u apt. He stated he would not make an apt with Dr. Alva Garnet, because Dr. Alva Garnet told him he was going to die and his COPD will never get better. Dr. Lake Bells is his lung doctor and he will reach out to him if he needs anything.

## 2019-04-24 NOTE — Telephone Encounter (Signed)
-----   Message from Wilhelmina Mcardle, MD sent at 04/07/2019 12:29 PM EDT ----- Please schedule office follow up with me in 2-3 weeks  Thanks  Waunita Schooner

## 2019-04-26 ENCOUNTER — Telehealth: Payer: Self-pay | Admitting: Adult Health Nurse Practitioner

## 2019-04-26 NOTE — Telephone Encounter (Signed)
Spoke with patient's wife [name] regarding Palliative services and after a very lengthy discussion she requested that I call patient's cell number and talk with him about scheduling.  I then called patient with no answer, left message with reason for call along with my name and contact information.

## 2019-04-27 ENCOUNTER — Emergency Department (HOSPITAL_COMMUNITY): Payer: Medicare HMO

## 2019-04-27 ENCOUNTER — Observation Stay (HOSPITAL_COMMUNITY)
Admission: EM | Admit: 2019-04-27 | Discharge: 2019-04-28 | Disposition: A | Discharge status: Home / Self Care | Payer: Medicare HMO | Attending: Internal Medicine | Admitting: Internal Medicine

## 2019-04-27 ENCOUNTER — Encounter (HOSPITAL_COMMUNITY): Payer: Self-pay | Admitting: Surgery

## 2019-04-27 ENCOUNTER — Other Ambulatory Visit: Payer: Self-pay

## 2019-04-27 DIAGNOSIS — M6281 Muscle weakness (generalized): Secondary | ICD-10-CM | POA: Diagnosis not present

## 2019-04-27 DIAGNOSIS — E785 Hyperlipidemia, unspecified: Secondary | ICD-10-CM | POA: Diagnosis not present

## 2019-04-27 DIAGNOSIS — R2681 Unsteadiness on feet: Secondary | ICD-10-CM | POA: Insufficient documentation

## 2019-04-27 DIAGNOSIS — E1140 Type 2 diabetes mellitus with diabetic neuropathy, unspecified: Secondary | ICD-10-CM | POA: Insufficient documentation

## 2019-04-27 DIAGNOSIS — Z794 Long term (current) use of insulin: Secondary | ICD-10-CM | POA: Insufficient documentation

## 2019-04-27 DIAGNOSIS — K5900 Constipation, unspecified: Secondary | ICD-10-CM | POA: Diagnosis not present

## 2019-04-27 DIAGNOSIS — I10 Essential (primary) hypertension: Secondary | ICD-10-CM | POA: Diagnosis present

## 2019-04-27 DIAGNOSIS — Z951 Presence of aortocoronary bypass graft: Secondary | ICD-10-CM | POA: Insufficient documentation

## 2019-04-27 DIAGNOSIS — Z9981 Dependence on supplemental oxygen: Secondary | ICD-10-CM | POA: Diagnosis not present

## 2019-04-27 DIAGNOSIS — E1169 Type 2 diabetes mellitus with other specified complication: Secondary | ICD-10-CM | POA: Insufficient documentation

## 2019-04-27 DIAGNOSIS — Z7951 Long term (current) use of inhaled steroids: Secondary | ICD-10-CM | POA: Insufficient documentation

## 2019-04-27 DIAGNOSIS — F3162 Bipolar disorder, current episode mixed, moderate: Secondary | ICD-10-CM | POA: Diagnosis present

## 2019-04-27 DIAGNOSIS — J9611 Chronic respiratory failure with hypoxia: Secondary | ICD-10-CM | POA: Diagnosis present

## 2019-04-27 DIAGNOSIS — I5032 Chronic diastolic (congestive) heart failure: Secondary | ICD-10-CM | POA: Diagnosis present

## 2019-04-27 DIAGNOSIS — J449 Chronic obstructive pulmonary disease, unspecified: Secondary | ICD-10-CM | POA: Diagnosis not present

## 2019-04-27 DIAGNOSIS — R079 Chest pain, unspecified: Secondary | ICD-10-CM

## 2019-04-27 DIAGNOSIS — Y92009 Unspecified place in unspecified non-institutional (private) residence as the place of occurrence of the external cause: Secondary | ICD-10-CM | POA: Insufficient documentation

## 2019-04-27 DIAGNOSIS — Z9181 History of falling: Secondary | ICD-10-CM | POA: Diagnosis not present

## 2019-04-27 DIAGNOSIS — Z1159 Encounter for screening for other viral diseases: Secondary | ICD-10-CM | POA: Diagnosis not present

## 2019-04-27 DIAGNOSIS — G4700 Insomnia, unspecified: Secondary | ICD-10-CM | POA: Insufficient documentation

## 2019-04-27 DIAGNOSIS — E119 Type 2 diabetes mellitus without complications: Secondary | ICD-10-CM | POA: Diagnosis present

## 2019-04-27 DIAGNOSIS — Z87891 Personal history of nicotine dependence: Secondary | ICD-10-CM | POA: Insufficient documentation

## 2019-04-27 DIAGNOSIS — R509 Fever, unspecified: Principal | ICD-10-CM | POA: Diagnosis present

## 2019-04-27 DIAGNOSIS — F418 Other specified anxiety disorders: Secondary | ICD-10-CM | POA: Insufficient documentation

## 2019-04-27 DIAGNOSIS — Z881 Allergy status to other antibiotic agents status: Secondary | ICD-10-CM | POA: Insufficient documentation

## 2019-04-27 DIAGNOSIS — D509 Iron deficiency anemia, unspecified: Secondary | ICD-10-CM | POA: Insufficient documentation

## 2019-04-27 DIAGNOSIS — R2689 Other abnormalities of gait and mobility: Secondary | ICD-10-CM | POA: Insufficient documentation

## 2019-04-27 DIAGNOSIS — E559 Vitamin D deficiency, unspecified: Secondary | ICD-10-CM | POA: Diagnosis not present

## 2019-04-27 DIAGNOSIS — R296 Repeated falls: Secondary | ICD-10-CM | POA: Insufficient documentation

## 2019-04-27 DIAGNOSIS — K219 Gastro-esophageal reflux disease without esophagitis: Secondary | ICD-10-CM | POA: Insufficient documentation

## 2019-04-27 DIAGNOSIS — I110 Hypertensive heart disease with heart failure: Secondary | ICD-10-CM | POA: Diagnosis not present

## 2019-04-27 DIAGNOSIS — D649 Anemia, unspecified: Secondary | ICD-10-CM | POA: Diagnosis not present

## 2019-04-27 DIAGNOSIS — I252 Old myocardial infarction: Secondary | ICD-10-CM | POA: Insufficient documentation

## 2019-04-27 DIAGNOSIS — R0602 Shortness of breath: Secondary | ICD-10-CM

## 2019-04-27 DIAGNOSIS — J9612 Chronic respiratory failure with hypercapnia: Secondary | ICD-10-CM | POA: Diagnosis not present

## 2019-04-27 DIAGNOSIS — D638 Anemia in other chronic diseases classified elsewhere: Secondary | ICD-10-CM | POA: Insufficient documentation

## 2019-04-27 DIAGNOSIS — I2510 Atherosclerotic heart disease of native coronary artery without angina pectoris: Secondary | ICD-10-CM | POA: Diagnosis present

## 2019-04-27 DIAGNOSIS — Z8249 Family history of ischemic heart disease and other diseases of the circulatory system: Secondary | ICD-10-CM | POA: Insufficient documentation

## 2019-04-27 DIAGNOSIS — Z880 Allergy status to penicillin: Secondary | ICD-10-CM | POA: Insufficient documentation

## 2019-04-27 DIAGNOSIS — Z885 Allergy status to narcotic agent status: Secondary | ICD-10-CM | POA: Insufficient documentation

## 2019-04-27 DIAGNOSIS — E118 Type 2 diabetes mellitus with unspecified complications: Secondary | ICD-10-CM | POA: Diagnosis present

## 2019-04-27 DIAGNOSIS — W19XXXA Unspecified fall, initial encounter: Secondary | ICD-10-CM | POA: Diagnosis not present

## 2019-04-27 DIAGNOSIS — Z79899 Other long term (current) drug therapy: Secondary | ICD-10-CM | POA: Insufficient documentation

## 2019-04-27 DIAGNOSIS — Z6834 Body mass index (BMI) 34.0-34.9, adult: Secondary | ICD-10-CM | POA: Insufficient documentation

## 2019-04-27 LAB — CBC WITH DIFFERENTIAL/PLATELET
Abs Immature Granulocytes: 0.3 10*3/uL — ABNORMAL HIGH (ref 0.00–0.07)
Basophils Absolute: 0 10*3/uL (ref 0.0–0.1)
Basophils Relative: 1 %
Eosinophils Absolute: 0.1 10*3/uL (ref 0.0–0.5)
Eosinophils Relative: 1 %
HCT: 33.5 % — ABNORMAL LOW (ref 39.0–52.0)
Hemoglobin: 9.9 g/dL — ABNORMAL LOW (ref 13.0–17.0)
Immature Granulocytes: 4 %
Lymphocytes Relative: 9 %
Lymphs Abs: 0.7 10*3/uL (ref 0.7–4.0)
MCH: 28.9 pg (ref 26.0–34.0)
MCHC: 29.6 g/dL — ABNORMAL LOW (ref 30.0–36.0)
MCV: 97.7 fL (ref 80.0–100.0)
Monocytes Absolute: 0.6 10*3/uL (ref 0.1–1.0)
Monocytes Relative: 7 %
Neutro Abs: 6 10*3/uL (ref 1.7–7.7)
Neutrophils Relative %: 78 %
Platelets: 174 10*3/uL (ref 150–400)
RBC: 3.43 MIL/uL — ABNORMAL LOW (ref 4.22–5.81)
RDW: 14.9 % (ref 11.5–15.5)
WBC: 7.6 10*3/uL (ref 4.0–10.5)
nRBC: 0 % (ref 0.0–0.2)

## 2019-04-27 LAB — COMPREHENSIVE METABOLIC PANEL
ALT: 13 U/L (ref 0–44)
AST: 17 U/L (ref 15–41)
Albumin: 3.1 g/dL — ABNORMAL LOW (ref 3.5–5.0)
Alkaline Phosphatase: 103 U/L (ref 38–126)
Anion gap: 11 (ref 5–15)
BUN: 9 mg/dL (ref 8–23)
CO2: 33 mmol/L — ABNORMAL HIGH (ref 22–32)
Calcium: 8.9 mg/dL (ref 8.9–10.3)
Chloride: 94 mmol/L — ABNORMAL LOW (ref 98–111)
Creatinine, Ser: 1.1 mg/dL (ref 0.61–1.24)
GFR calc Af Amer: >60 mL/min (ref >60)
GFR calc non Af Amer: >60 mL/min (ref >60)
Glucose, Bld: 172 mg/dL — ABNORMAL HIGH (ref 70–99)
Potassium: 4.7 mmol/L (ref 3.5–5.1)
Sodium: 138 mmol/L (ref 135–145)
Total Bilirubin: 0.5 mg/dL (ref 0.3–1.2)
Total Protein: 6.2 g/dL — ABNORMAL LOW (ref 6.5–8.1)

## 2019-04-27 LAB — URINALYSIS, ROUTINE W REFLEX MICROSCOPIC
Bilirubin Urine: NEGATIVE
Glucose, UA: NEGATIVE mg/dL
Hgb urine dipstick: NEGATIVE
Ketones, ur: NEGATIVE mg/dL
Leukocytes,Ua: NEGATIVE
Nitrite: NEGATIVE
Protein, ur: 100 mg/dL — AB
Specific Gravity, Urine: 1.018 (ref 1.005–1.030)
pH: 5 (ref 5.0–8.0)

## 2019-04-27 LAB — TROPONIN I (HIGH SENSITIVITY)
Troponin I (High Sensitivity): 14 ng/L (ref <18)
Troponin I (High Sensitivity): 16 ng/L (ref <18)

## 2019-04-27 LAB — PROCALCITONIN: Procalcitonin: <0.1 ng/mL

## 2019-04-27 LAB — LACTIC ACID, PLASMA: Lactic Acid, Venous: 1.8 mmol/L (ref 0.5–1.9)

## 2019-04-27 LAB — SARS CORONAVIRUS 2 BY RT PCR (HOSPITAL ORDER, PERFORMED IN ~~LOC~~ HOSPITAL LAB): SARS Coronavirus 2: NEGATIVE

## 2019-04-27 LAB — GLUCOSE, CAPILLARY: Glucose-Capillary: 228 mg/dL — ABNORMAL HIGH (ref 70–99)

## 2019-04-27 MED ORDER — FERROUS SULFATE 325 (65 FE) MG PO TABS
325.0000 mg | ORAL_TABLET | Freq: Three times a day (TID) | ORAL | Status: DC
  Administered 2019-04-28: 325 mg via ORAL
  Filled 2019-04-27 (×2): qty 1

## 2019-04-27 MED ORDER — SODIUM CHLORIDE 0.9 % IV BOLUS
500.0000 mL | Freq: Once | INTRAVENOUS | Status: AC
  Administered 2019-04-27: 16:00:00 500 mL via INTRAVENOUS

## 2019-04-27 MED ORDER — PANTOPRAZOLE SODIUM 40 MG PO TBEC
40.0000 mg | DELAYED_RELEASE_TABLET | Freq: Two times a day (BID) | ORAL | Status: DC
  Administered 2019-04-27 – 2019-04-28 (×2): 40 mg via ORAL
  Filled 2019-04-27 (×2): qty 1

## 2019-04-27 MED ORDER — CITALOPRAM HYDROBROMIDE 20 MG PO TABS
40.0000 mg | ORAL_TABLET | Freq: Every day | ORAL | Status: DC
  Administered 2019-04-27 – 2019-04-28 (×2): 40 mg via ORAL
  Filled 2019-04-27: qty 2

## 2019-04-27 MED ORDER — ACETAMINOPHEN 325 MG PO TABS
650.0000 mg | ORAL_TABLET | Freq: Four times a day (QID) | ORAL | Status: DC | PRN
  Administered 2019-04-28: 02:00:00 650 mg via ORAL
  Filled 2019-04-27: qty 2

## 2019-04-27 MED ORDER — INSULIN ASPART 100 UNIT/ML ~~LOC~~ SOLN
0.0000 [IU] | Freq: Three times a day (TID) | SUBCUTANEOUS | Status: DC
  Administered 2019-04-28: 12:00:00 3 [IU] via SUBCUTANEOUS
  Administered 2019-04-28: 07:00:00 4 [IU] via SUBCUTANEOUS

## 2019-04-27 MED ORDER — MOMETASONE FURO-FORMOTEROL FUM 200-5 MCG/ACT IN AERO
2.0000 | INHALATION_SPRAY | Freq: Two times a day (BID) | RESPIRATORY_TRACT | Status: DC
  Administered 2019-04-28: 2 via RESPIRATORY_TRACT
  Filled 2019-04-27: qty 8.8

## 2019-04-27 MED ORDER — VANCOMYCIN HCL 10 G IV SOLR
1500.0000 mg | Freq: Once | INTRAVENOUS | Status: AC
  Administered 2019-04-27: 18:00:00 1500 mg via INTRAVENOUS
  Filled 2019-04-27: qty 1500

## 2019-04-27 MED ORDER — INSULIN GLARGINE 100 UNIT/ML ~~LOC~~ SOLN
40.0000 [IU] | Freq: Two times a day (BID) | SUBCUTANEOUS | Status: DC
  Administered 2019-04-27 – 2019-04-28 (×2): 40 [IU] via SUBCUTANEOUS
  Filled 2019-04-27 (×3): qty 0.4

## 2019-04-27 MED ORDER — SODIUM CHLORIDE 0.9% FLUSH
3.0000 mL | Freq: Two times a day (BID) | INTRAVENOUS | Status: DC
  Administered 2019-04-27 – 2019-04-28 (×2): 3 mL via INTRAVENOUS

## 2019-04-27 MED ORDER — LISINOPRIL 5 MG PO TABS
2.5000 mg | ORAL_TABLET | Freq: Every day | ORAL | Status: DC
  Administered 2019-04-27 – 2019-04-28 (×2): 2.5 mg via ORAL
  Filled 2019-04-27 (×2): qty 1

## 2019-04-27 MED ORDER — FUROSEMIDE 40 MG PO TABS
60.0000 mg | ORAL_TABLET | Freq: Every day | ORAL | Status: DC
  Administered 2019-04-28: 60 mg via ORAL
  Filled 2019-04-27: qty 1

## 2019-04-27 MED ORDER — HEPARIN SODIUM (PORCINE) 5000 UNIT/ML IJ SOLN
5000.0000 [IU] | Freq: Three times a day (TID) | INTRAMUSCULAR | Status: DC
  Administered 2019-04-27 – 2019-04-28 (×2): 5000 [IU] via SUBCUTANEOUS
  Filled 2019-04-27 (×2): qty 1

## 2019-04-27 MED ORDER — ATORVASTATIN CALCIUM 10 MG PO TABS: 10.0000 mg | ORAL_TABLET | Freq: Every day | ORAL | Status: DC

## 2019-04-27 MED ORDER — IPRATROPIUM-ALBUTEROL 0.5-2.5 (3) MG/3ML IN SOLN: 3.0000 mL | Freq: Four times a day (QID) | RESPIRATORY_TRACT | Status: DC | PRN

## 2019-04-27 MED ORDER — GABAPENTIN 300 MG PO CAPS
600.0000 mg | ORAL_CAPSULE | Freq: Four times a day (QID) | ORAL | Status: DC
  Administered 2019-04-27 – 2019-04-28 (×2): 600 mg via ORAL
  Filled 2019-04-27 (×2): qty 2

## 2019-04-27 MED ORDER — AZITHROMYCIN 250 MG PO TABS
250.0000 mg | ORAL_TABLET | ORAL | Status: DC
  Administered 2019-04-28: 250 mg via ORAL
  Filled 2019-04-27: qty 1

## 2019-04-27 MED ORDER — ALPRAZOLAM 0.25 MG PO TABS
0.2500 mg | ORAL_TABLET | Freq: Every day | ORAL | Status: DC | PRN
  Administered 2019-04-27: 0.25 mg via ORAL
  Filled 2019-04-27: qty 1

## 2019-04-27 MED ORDER — ALBUTEROL SULFATE (2.5 MG/3ML) 0.083% IN NEBU: 2.5000 mg | INHALATION_SOLUTION | RESPIRATORY_TRACT | Status: DC | PRN

## 2019-04-27 MED ORDER — ALPRAZOLAM 0.25 MG PO TABS: 0.2500 mg | ORAL_TABLET | Freq: Every day | ORAL | Status: DC | PRN

## 2019-04-27 MED ORDER — FERROUS SULFATE 325 (65 FE) MG PO TABS: 325.0000 mg | ORAL_TABLET | Freq: Three times a day (TID) | ORAL | Status: DC

## 2019-04-27 MED ORDER — METOPROLOL TARTRATE 12.5 MG HALF TABLET
12.5000 mg | ORAL_TABLET | Freq: Two times a day (BID) | ORAL | Status: DC
  Administered 2019-04-27 – 2019-04-28 (×2): 12.5 mg via ORAL
  Filled 2019-04-27 (×2): qty 1

## 2019-04-27 MED ORDER — SODIUM CHLORIDE 0.9 % IV SOLN
2.0000 g | Freq: Once | INTRAVENOUS | Status: AC
  Administered 2019-04-27: 16:00:00 2 g via INTRAVENOUS
  Filled 2019-04-27: qty 2

## 2019-04-27 MED ORDER — ACETAMINOPHEN 325 MG PO TABS
650.0000 mg | ORAL_TABLET | Freq: Once | ORAL | Status: AC
  Administered 2019-04-27: 650 mg via ORAL
  Filled 2019-04-27: qty 2

## 2019-04-27 MED ORDER — ACETAMINOPHEN 650 MG RE SUPP: 650.0000 mg | Freq: Four times a day (QID) | RECTAL | Status: DC | PRN

## 2019-04-27 MED ORDER — DOCUSATE SODIUM 100 MG PO CAPS
100.0000 mg | ORAL_CAPSULE | Freq: Two times a day (BID) | ORAL | Status: DC
  Administered 2019-04-27 – 2019-04-28 (×2): 100 mg via ORAL
  Filled 2019-04-27 (×2): qty 1

## 2019-04-27 MED ORDER — ALBUTEROL SULFATE HFA 108 (90 BASE) MCG/ACT IN AERS: 2.0000 | INHALATION_SPRAY | Freq: Four times a day (QID) | RESPIRATORY_TRACT | Status: DC | PRN
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
## 2019-04-27 NOTE — ED Provider Notes (Signed)
Care assumed from [facility].  Please see her full H&P.  In short,  [name] is a 78 y.o. male presents for mechanical fall.  On arrival he was febrile and tachycardic.  Work-up from previous provider difficult for possible UTI, culture sent.  CMP and CBC unremarkable. Xrays pending. Pt will likely need admission for fever, broad antibiotics started.    Physical Exam  BP 130/65   Pulse (!) 109   Temp (!) 101.3 F (38.5 C) (Oral)   Resp 20   Ht 6\' 3"  (1.905 m)   SpO2 96%   BMI 34.87 kg/m   Physical Exam  PE: Constitutional: Chronically ill-appearing no apparent distress.  Patient is speaking in full sentences without difficulty.  SpO2 94% on 4L HENT: normocephalic, atraumatic. no cervical adenopathy.  Mucous membranes are dry. Cardiovascular: normal rate and rhythm, distal pulses intact Pulmonary/Chest: effort normal; breath sounds clear and equal bilaterally; no wheezes or rales Abdominal: soft and nontender Musculoskeletal: full ROM, no edema. Decreased ROM of thoracic and lumbar spine. Neurological: alert with goal directed thinking Skin: warm and dry, no rash, no diaphoresis Psychiatric: normal mood and affect, normal behavior    ED Course/Procedures   Clinical Course as of Apr 26 1545  Sat Apr 27, 2019  1459 WBCs are within normal limits.   WBC: 7.6 [AH]  1459 Low hemoglobin noted at 9.9. This appears to be at baseline when compared to previous values.   Hemoglobin(!): 9.9 [AH]  1500 First troponin is 14.  Troponin I (High Sensitivity): 14 [AH]    Clinical Course User Index [AH] Arville Lime, PA-C   Results for orders placed or performed during the hospital encounter of 04/27/19 (from the past 24 hour(s))  Comprehensive metabolic panel     Status: Abnormal   Collection Time: 04/27/19  2:01 PM  Result Value Ref Range   Sodium 138 135 - 145 mmol/L   Potassium 4.7 3.5 - 5.1 mmol/L   Chloride 94 (L) 98 - 111 mmol/L   CO2 33 (H) 22 - 32 mmol/L   Glucose, Bld 172 (H) 70 - 99 mg/dL   BUN 9 8 - 23 mg/dL   Creatinine, Ser 1.10 0.61 - 1.24 mg/dL   Calcium 8.9 8.9 - 10.3 mg/dL   Total Protein 6.2 (L) 6.5 - 8.1 g/dL   Albumin 3.1 (L) 3.5 - 5.0 g/dL   AST 17 15 - 41 U/L   ALT 13 0 - 44 U/L   Alkaline Phosphatase 103 38 - 126 U/L   Total Bilirubin 0.5 0.3 - 1.2 mg/dL   GFR calc non Af Amer >60 >60 mL/min   GFR calc Af Amer >60 >60 mL/min   Anion gap 11 5 - 15  CBC with Differential     Status: Abnormal   Collection Time: 04/27/19  2:01 PM  Result Value Ref Range   WBC 7.6 4.0 - 10.5 K/uL   RBC 3.43 (L) 4.22 - 5.81 MIL/uL   Hemoglobin 9.9 (L) 13.0 - 17.0 g/dL   HCT 33.5 (L) 39.0 - 52.0 %   MCV 97.7 80.0 - 100.0 fL   MCH 28.9 26.0 - 34.0 pg   MCHC 29.6 (L) 30.0 - 36.0 g/dL   RDW 14.9 11.5 - 15.5 %   Platelets 174 150 - 400 K/uL   nRBC 0.0 0.0 - 0.2 %   Neutrophils Relative % 78 %   Neutro Abs 6.0 1.7 - 7.7 K/uL   Lymphocytes Relative 9 %  Lymphs Abs 0.7 0.7 - 4.0 K/uL   Monocytes Relative 7 %   Monocytes Absolute 0.6 0.1 - 1.0 K/uL   Eosinophils Relative 1 %   Eosinophils Absolute 0.1 0.0 - 0.5 K/uL   Basophils Relative 1 %   Basophils Absolute 0.0 0.0 - 0.1 K/uL   Immature Granulocytes 4 %   Abs Immature Granulocytes 0.30 (H) 0.00 - 0.07 K/uL  Troponin I (High Sensitivity)     Status: None   Collection Time: 04/27/19  2:01 PM  Result Value Ref Range   Troponin I (High Sensitivity) 14 <18 ng/L  SARS Coronavirus 2 (CEPHEID- Performed in [place] hospital lab), Hosp Order     Status: None   Collection Time: 04/27/19  2:48 PM   Specimen: Nasopharyngeal Swab  Result Value Ref Range   SARS Coronavirus 2 NEGATIVE NEGATIVE  Urinalysis, Routine w reflex microscopic     Status: Abnormal   Collection Time: 04/27/19  3:22 PM  Result Value Ref Range   Color, Urine YELLOW YELLOW   APPearance HAZY (A) CLEAR   Specific Gravity, Urine 1.018 1.005 - 1.030   pH 5.0 5.0 - 8.0   Glucose, UA NEGATIVE NEGATIVE mg/dL   Hgb urine  dipstick NEGATIVE NEGATIVE   Bilirubin Urine NEGATIVE NEGATIVE   Ketones, ur NEGATIVE NEGATIVE mg/dL   Protein, ur 100 (A) NEGATIVE mg/dL   Nitrite NEGATIVE NEGATIVE   Leukocytes,Ua NEGATIVE NEGATIVE   RBC / HPF 0-5 0 - 5 RBC/hpf   WBC, UA 11-20 0 - 5 WBC/hpf   Bacteria, UA RARE (A) NONE SEEN   Squamous Epithelial / LPF 0-5 0 - 5   Non Squamous Epithelial 0-5 (A) NONE SEEN  Lactic acid, plasma     Status: None   Collection Time: 04/27/19  3:25 PM  Result Value Ref Range   Lactic Acid, Venous 1.8 0.5 - 1.9 mmol/L  Troponin I (High Sensitivity)     Status: None   Collection Time: 04/27/19  3:25 PM  Result Value Ref Range   Troponin I (High Sensitivity) 16 <18 ng/L    EKG Interpretation  Date/Time:  Saturday April 27 2019 12:54:27 EDT Ventricular Rate:  111 PR Interval:    QRS Duration: 127 QT Interval:  339 QTC Calculation: 461 R Axis:   -74 Text Interpretation:  Ectopic atrial tachycardia, unifocal RBBB and LAFB Confirmed by Virgel Manifold [phone]) on 04/27/2019 2:40:54 PM  EXAM: PORTABLE CHEST 1 VIEW COMPARISON: April 06, 2019 FINDINGS: Postsurgical changes from CABG. Cardiomediastinal silhouette is normal. Mediastinal contours appear intact. Known hiatal hernia.There is no evidence of focal airspace consolidation, pleural effusion or pneumothorax. Left lower lobe atelectasis. Osseous structures are without acute abnormality. Soft tissues are grossly normal. IMPRESSION: Left lower lobe atelectasis. Electronically Signed By: Fidela Salisbury M.D. On: 04/27/2019 16:06  THORACIC SPINE 2 VIEWS  COMPARISON: None. FINDINGS: There is no evidence of thoracic spine fracture. Alignment isnormal. No other significant bone abnormalities are identified.IMPRESSION: Negative. Electronically Signed By: Dorise Bullion III M.D On: 04/27/2019 17:36  EXAM: [place] 4+ VIEW COMPARISON: CT of the chest March 30, 2019 FINDINGS: Mild anterior wedging of T12 is stable. No acute  fracture or traumatic malalignment. Multilevel degenerative changes. Lower lumbar facet degenerative changes. IMPRESSION: Degenerative changes as above. No acute fracture or traumatic malalignment. Mild wedging of T12 is nonacute. Electronically Signed By: Dorise Bullion III M.D On: 04/27/2019 17:35     MDM   Patient presents after mechanical fall, febrile and tachycardic on arrival.  Broad antibiotics started for fever with unknown origin.  Work-up significant for urinary tract infection with 11-20 WBC rare bacteria.  Culture sent.  CBC without leukocytosis.  Hemoglobin is stable compared to prior labs.  Chest x-ray viewed by me with left lower lobe atelectasis. Patient had recent pneumonia, suspect this is related.  Initial troponin 14.  Lactic acid within normal range.  Covid test is negative.  Lumbar and thoracic x-rays are negative for acute findings.  I personally viewed these with radiology report. Findings and plan of care discussed with supervising physician Dr. Darl Householder. Spoke with Dr. Posey Pronto with hospitalist service who agrees to assume care of patient and bring into the hospital for further evaluation and management of fever.  This note was prepared using Dragon voice recognition software and may include unintentional dictation errors due to the inherent limitations of voice recognition software.      Flint Melter 04/28/19 1203    Drenda Freeze, MD 04/28/19 2245

## 2019-04-27 NOTE — ED Triage Notes (Signed)
Pt here after fall off the toilet. Pt endorses lightheadedness and dizziness but that his knees gave way. Did not hit head, did not lose consciousness. C/o back and knee pain. Hx chronic back pain. Wears 6L O2.
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
## 2019-04-27 NOTE — ED Notes (Signed)
Patient transported to X-ray 
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
## 2019-04-28 ENCOUNTER — Observation Stay (HOSPITAL_COMMUNITY): Payer: Medicare HMO

## 2019-04-28 DIAGNOSIS — R509 Fever, unspecified: Secondary | ICD-10-CM | POA: Diagnosis not present

## 2019-04-28 LAB — BASIC METABOLIC PANEL
Anion gap: 8 (ref 5–15)
BUN: 12 mg/dL (ref 8–23)
CO2: 35 mmol/L — ABNORMAL HIGH (ref 22–32)
Calcium: 8.5 mg/dL — ABNORMAL LOW (ref 8.9–10.3)
Chloride: 97 mmol/L — ABNORMAL LOW (ref 98–111)
Creatinine, Ser: 1.15 mg/dL (ref 0.61–1.24)
GFR calc Af Amer: >60 mL/min (ref >60)
GFR calc non Af Amer: >60 mL/min (ref >60)
Glucose, Bld: 165 mg/dL — ABNORMAL HIGH (ref 70–99)
Potassium: 4.2 mmol/L (ref 3.5–5.1)
Sodium: 140 mmol/L (ref 135–145)

## 2019-04-28 LAB — CBC
HCT: 32.2 % — ABNORMAL LOW (ref 39.0–52.0)
Hemoglobin: 9.4 g/dL — ABNORMAL LOW (ref 13.0–17.0)
MCH: 27.7 pg (ref 26.0–34.0)
MCHC: 29.2 g/dL — ABNORMAL LOW (ref 30.0–36.0)
MCV: 95 fL (ref 80.0–100.0)
Platelets: 172 10*3/uL (ref 150–400)
RBC: 3.39 MIL/uL — ABNORMAL LOW (ref 4.22–5.81)
RDW: 14.9 % (ref 11.5–15.5)
WBC: 5 10*3/uL (ref 4.0–10.5)
nRBC: 0 % (ref 0.0–0.2)

## 2019-04-28 LAB — GLUCOSE, CAPILLARY
Glucose-Capillary: 192 mg/dL — ABNORMAL HIGH (ref 70–99)
Glucose-Capillary: 125 mg/dL — ABNORMAL HIGH (ref 70–99)

## 2019-04-28 LAB — URINE CULTURE: Culture: 10000 — AB

## 2019-04-28 MED ORDER — LISINOPRIL 2.5 MG PO TABS: 2.5000 mg | ORAL_TABLET | Freq: Every day | ORAL | 0 refills | Status: AC

## 2019-04-28 MED ORDER — ATORVASTATIN CALCIUM 10 MG PO TABS: 10.0000 mg | ORAL_TABLET | Freq: Every day | ORAL | 0 refills | Status: DC

## 2019-04-28 MED ORDER — FUROSEMIDE 20 MG PO TABS: 60.0000 mg | ORAL_TABLET | Freq: Every day | ORAL | 0 refills | Status: DC

## 2019-04-28 NOTE — TOC Transition Note (Signed)
Transition of Care [facility]) - CM/SW Discharge Note   Patient Details  Name: [name] MRN: [id number] Date of Birth: May 19, 1941  Transition of Care [facility]) CM/SW Contact:  Carles Collet, RN Phone Number: 04/28/2019, 12:35 PM   Clinical Narrative:   Damaris Schooner w patient, he states he is active w [facility] for [facility]. Notified Melissa w [facility] that he will return home today. No other CM needs identified.     Final next level of care: [place] Barriers to Discharge: No Barriers Identified   Patient Goals and CMS Choice Patient states their goals for this hospitalization and ongoing recovery are:: to return home CMS Medicare.gov Compare Post Acute Care list provided to:: Patient Choice offered to / list presented to : Patient  Discharge Placement                       Discharge Plan and Services                          HH Arranged: RN, PT, OT, Nurse's Aide [facility] Agency: [facility] ([facility]) Date [facility] Agency Contacted: 04/28/19 Time [place]: 1234 Representative spoke with at [place]: [place] ([place]) Interventions     Readmission Risk Interventions Readmission Risk Prevention Plan 04/13/2019  Transportation Screening Complete  Medication Review Press photographer) Complete  PCP or Specialist appointment within 3-5 days of discharge Complete  HRI or [place] Complete  SW Recovery Care/Counseling Consult Complete  Palliative Care Screening Patient [place] Patient Refused  Some recent data might be hidden

## 2019-04-28 NOTE — Progress Notes (Signed)
Pt states he cannot wear his CPAP due to his nose being stopped up. Pt was taken off and placed back on 6L.

## 2019-04-28 NOTE — Progress Notes (Addendum)
Pt feels he is not ready for discharge as he needs a specialist for his legs and nobody as addressed what is wrong, I paged Dr Broadus John who says she spoke to him at length and will come talk to him a 1500 Dr Broadus John came to bedside to talk to pt "I dont want to talk to you" reminded pt that she wanted to talk to her. Dr Geraldo Pitter but pt declined

## 2019-04-28 NOTE — Progress Notes (Signed)
Pt has dc orders and home health, paged Debbie CM to advise

## 2019-04-28 NOTE — Evaluation (Signed)
Occupational Therapy Evaluation Patient Details Name: [name] MRN: [id number] DOB: 1941/09/09 Today's Date: 04/28/2019    History of Present Illness [name] is a 78 y.o. male with medical history significant for chronic respiratory failure with hypoxia on supplemental O2, stage IV COPD, CAD status post CABG, chronic diastolic CHF, AAA status post endovascular repair, insulin-dependent type 2 diabetes, hypertension, hyperlipidemia, anemia, depression, anxiety, and bipolar disorder who presents to the ED for evaluation after a fall at home, getting up from the toilet; reports intermittent dizziness   Clinical Impression   PTA patient reports completing household mobility without AD, assist required for LB ADLs and sponge bathing only (spouse completes IADLs, including med mgmt). Patient admitted for above and limited by problem list below, including generalized weakness, decreased activity tolerance, impaired balance.  Cognitively, presents with decreased STM and decreased safety awareness; tangential and requires redirection often. Requires mod-max assist for LB ADLs, min -s/u for UB ADLs, min guard for transfers and grooming at sink using RW.  Patient will benefit from continued OT services while admitted and after dc at [facility] level in order to optimize independence and safety with ADLs/mobility.     Follow Up Recommendations  Home health OT;Supervision/Assistance - 24 hour    Equipment Recommendations  None recommended by OT    Recommendations for Other Services       Precautions / Restrictions Precautions Precautions: Fall Restrictions Weight Bearing Restrictions: No      Mobility Bed Mobility Overal bed mobility: Needs Assistance Bed Mobility: Sit to Supine       Sit to supine: Supervision   General bed mobility comments: supervision for safety   Transfers Overall transfer level: Needs assistance Equipment used: Rolling walker (2 wheeled) Transfers: Sit  to/from Stand Sit to Stand: Min guard         General transfer comment: min guard for safety/balance, no physical support provided    Balance Overall balance assessment: Needs assistance Sitting-balance support: No upper extremity supported;Feet supported Sitting balance-Leahy Scale: Good     Standing balance support: Bilateral upper extremity supported;During functional activity;No upper extremity supported Standing balance-Leahy Scale: Fair Standing balance comment: reliant on B UE support dynamically, requires min guard with 0 hand support standing                            ADL either performed or assessed with clinical judgement   ADL Overall ADL's : Needs assistance/impaired     Grooming: Min guard;Wash/dry hands;Standing   Upper Body Bathing: Minimal assistance;Sitting   Lower Body Bathing: Moderate assistance;Sit to/from stand Lower Body Bathing Details (indicate cue type and reason): decreased reach to B feet, min guard sit<>stand  Upper Body Dressing : Set up;Sitting   Lower Body Dressing: Moderate assistance;Sit to/from stand Lower Body Dressing Details (indicate cue type and reason): decreased reach to B feet, min guard sit<>stand; baseline level  Toilet Transfer: Min Marine scientist Details (indicate cue type and reason): simulated in room         Functional mobility during ADLs: Min guard;Rolling walker General ADL Comments: limited by generalized weakness, decreased activity tolerance     Vision         Perception     Praxis      Pertinent Vitals/Pain Pain Assessment: No/denies pain     Hand Dominance Right   Extremity/Trunk Assessment Upper Extremity Assessment Upper Extremity Assessment: Generalized weakness   Lower Extremity Assessment Lower Extremity  Assessment: Defer to PT evaluation       Communication Communication Communication: HOH   Cognition Arousal/Alertness: Awake/alert Behavior During  Therapy: WFL for tasks assessed/performed;Agitated;Impulsive Overall Cognitive Status: No family/caregiver present to determine baseline cognitive functioning Area of Impairment: Memory;Awareness;Problem solving;Safety/judgement                     Memory: Decreased short-term memory   Safety/Judgement: Decreased awareness of safety Awareness: Emergent Problem Solving: Slow processing;Requires verbal cues General Comments: pt tangential, decreased STM and decreased safety awareness   General Comments  on 6L supplemental oxgyen via [place] (baseline) SpO2 <92%; HR 87-94    Exercises     Shoulder Instructions      Home Living Family/patient expects to be discharged to:: Private residence Living Arrangements: Spouse/significant other Available Help at Discharge: Family Type of Home: House Home Access: Stairs to enter Technical brewer of Steps: 6 Entrance Stairs-Rails: Right;Left Home Layout: One level     Bathroom Shower/Tub: (sponge bathes )   Bathroom Toilet: Standard     Home Equipment: Environmental consultant - 2 wheels;Cane - single point;Bedside commode;Other (comment)          Prior Functioning/Environment Level of Independence: Needs assistance  Gait / Transfers Assistance Needed: walks household distances without DME  ADL's / Homemaking Assistance Needed: assist with LB self care, sponge bathes, no IADLs (spouse completes med mgmt)    Comments: pt sedentary        OT Problem List: Decreased strength;Cardiopulmonary status limiting activity;Decreased cognition;Decreased activity tolerance;Decreased safety awareness;Impaired balance (sitting and/or standing);Decreased knowledge of use of DME or AE      OT Treatment/Interventions: Self-care/ADL training;Balance training;Therapeutic exercise;Therapeutic activities;Energy conservation;DME and/or AE instruction;Patient/family education    OT Goals(Current goals can be found in the care plan section) Acute Rehab OT  Goals Patient Stated Goal: to get stronger OT Goal Formulation: With patient Time For Goal Achievement: 05/12/19 Potential to Achieve Goals: Good  OT Frequency: Min 2X/week   Barriers to D/C:            Co-evaluation              AM-PAC OT "6 Clicks" Daily Activity     Outcome Measure Help from another person eating meals?: None Help from another person taking care of personal grooming?: A Little Help from another person toileting, which includes using toliet, bedpan, or urinal?: A Little Help from another person bathing (including washing, rinsing, drying)?: A Lot Help from another person to put on and taking off regular upper body clothing?: A Little Help from another person to put on and taking off regular lower body clothing?: A Lot 6 Click Score: 17   End of Session Equipment Utilized During Treatment: Gait belt;Rolling walker;Oxygen Nurse Communication: Mobility status  Activity Tolerance: Patient tolerated treatment well Patient left: in bed;with call bell/phone within reach;with bed alarm set  OT Visit Diagnosis: Other abnormalities of gait and mobility (R26.89);Muscle weakness (generalized) (M62.81)                Time: 0539-7673 OT Time Calculation (min): 29 min Charges:  OT General Charges $OT Visit: 1 Visit OT Evaluation $OT Eval Moderate Complexity: 1 Mod OT Treatments $Self Care/Home Management : 8-22 mins  Delight Stare, OT Acute Rehabilitation Services Pager [phone] Office [phone]    Delight Stare 04/28/2019, 9:10 AM

## 2019-04-28 NOTE — Progress Notes (Signed)
Pt placed on Auto BIPAP for QHS at this time. Pt states he does not know what settings he is supposed to be on and is only supposed to wear it 15 minutes. I explained to him it is to be worn during his sleep and all throughout the night. He began asking me about getting up and using the restroom and becoming very angry with me. I explained to him, he can use his call bell to have someone assist him to the bedside commode and explained the bed alarm due to him being a fall risk. He began cursing and stating I was not taking this seriously and when he had to go he had to go. I explained I had other patients and was not on this floor the entire time but nursing staff would help him when needed. RN was made aware of his behavior. Pt placed on BIPAP and tolerating at this time.

## 2019-04-28 NOTE — Progress Notes (Signed)
Pt wife [name] called to inform of dc and need for clean clothes as current are soiled. She will bring to front desk

## 2019-04-28 NOTE — Discharge Summary (Signed)
Physician Discharge Summary  [name] TIR:[id number] DOB: Sep 03, 1941 DOA: 04/27/2019  PCP: [name], MD  Admit date: 04/27/2019 Discharge date: 04/28/2019  Time spent: 35 minutes  Recommendations for Outpatient Follow-up:  1. PCP Dr.Avbuere in 1 week, please ensure palliative follow-up, consider ongoing goals of care discussions 2. Pulmonary Dr. 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 male with medical history significant for chronic respiratory failure with hypoxia on supplemental O2, stage IV COPD, CAD status post CABG, chronic diastolic CHF, AAA status post endovascular repair, insulin-dependent type 2 diabetes, hypertension, hyperlipidemia, anemia, depression, anxiety, and bipolar disorder who presents to the ED for evaluation after a fall at home.  Patient states he was on the toilet at home after finishing a bowel movement when he tried to stand up and felt like his legs just gave out and he fell down onto the floor  Hospital Course:   This is a chronically ill debilitated 78 year old male with end-stage COPD on 6 to 7 L home oxygen, chronic respiratory failure, chronic diastolic CHF, CAD/CABG, type 2 diabetes mellitus, anxiety, depression, bipolar disorder and multiple other medical problems presented to the ED after a fall while trying to get up from the commode last night, patient felt that  his legs gave way, there was no loss of consciousness. -He presented to the emergency room where he was noted to have a low-grade temp of 100.3, he was admitted overnight for observation, SARS COVID-19 PCR was negative chest x-ray showed chronic left lower lobe atelectasis which is unchanged from baseline, urinalysis was unremarkable, abdominal exam is benign. -He was admitted overnight for observation -He remained stable and afebrile overnight, labs and vital signs were unremarkable, symptoms are chronic and stable -His chronic respiratory failure from end-stage COPD and diastolic CHF was felt to be stable at baseline. -PT OT eval completed, discharged home in a stable condition with resumption of home health services, advised to use a walker to ambulate. -With regards to his chronic respiratory failure from end-stage COPD seems like he was seen by palliative medicine last admission, supposed to have palliative follow-up at home this needs to be ensured, given his advanced chronic illnesses continues to have poor prognosis and significant risk of recurrent hospitalizations and decline.  Please continue goals of care discussions per PCP and pulmonary MD  Discharge Exam: Vitals:   04/28/19 0756 04/28/19 1137  BP:  (!) 151/83  Pulse:  81  Resp:  20  Temp:  (!) 97.5 F (36.4 C)  SpO2: 99% 100%    General: Awake oriented x3 Cardiovascular: S1-S2/regular rate rhythm Respiratory: Poor air movement bilaterally no wheezes or rhonchi  Discharge Instructions   Discharge Instructions    Diet - low sodium heart healthy   Complete by: As directed    Increase activity slowly   Complete by: As directed      Allergies as of 04/28/2019  Reactions   Morphine And Related Shortness Of Breath, Nausea And Vomiting, Rash, Other (See Comments)   Reaction:  Hallucinations    Penicillins Anaphylaxis, Hives, Other (See Comments)   10/02/18 - discussed with patient and he states he can tolerate  penicillin capsules and had some hives when he was 78yo and received pcn injections Has patient had a PCN reaction causing immediate rash, facial/tongue/throat swelling, SOB or lightheadedness with hypotension: Yes Has patient had a PCN reaction causing severe rash involving mucus membranes or skin necrosis: No Has patient had a PCN reaction that required hospitalization No Has patient had a PCN reaction occurring within the last 10 y   Zolpidem Shortness Of Breath   Demerol [meperidine] Other (See Comments)   Reaction:  Hallucinations     Dilaudid [hydromorphone Hcl] Other (See Comments)   Reaction:  Hallucinations    Levofloxacin Other (See Comments)   Reaction:  Unknown       Medication List    TAKE these medications   acetaminophen 500 MG tablet Commonly known as: TYLENOL Take 1,000 mg by mouth every 6 (six) hours as needed for fever or headache (pain). What changed: Another medication with the same name was removed. Continue taking this medication, and follow the directions you see here.   albuterol 108 (90 Base) MCG/ACT inhaler Commonly known as: VENTOLIN HFA Inhale 2 puffs into the lungs every 6 (six) hours as needed for wheezing or shortness of breath.   albuterol (2.5 MG/3ML) 0.083% nebulizer solution Commonly known as: PROVENTIL Inhale 3 mLs (2.5 mg total) into the lungs every 2 (two) hours as needed for wheezing or shortness of breath.   ALPRAZolam 0.25 MG tablet Commonly known as: XANAX Take 1 tablet (0.25 mg total) by mouth 3 (three) times daily as needed for anxiety or sleep.   atorvastatin 10 MG tablet Commonly known as: LIPITOR Take 1 tablet (10 mg total) by mouth daily at 6 PM.   citalopram 40 MG tablet Commonly known as: CELEXA Take 0.5 tablets (20 mg total) by mouth daily. What changed: how much to take   [employer] Melatonin 5 MG Tabs Generic drug: Melatonin Take 5 mg by mouth at bedtime. For sleep   docusate sodium 100 MG capsule Commonly known as:  COLACE Take 1 capsule (100 mg total) by mouth 2 (two) times daily.   Dulera 200-5 MCG/ACT Aero Generic drug: mometasone-formoterol Inhale 2 puffs into the lungs 2 (two) times daily as needed for wheezing or shortness of breath.   ferrous sulfate 325 (65 FE) MG tablet Take 1 tablet (325 mg total) by mouth 3 (three) times daily with meals.   fluticasone 50 MCG/ACT nasal spray Commonly known as: FLONASE Place 2 sprays into both nostrils daily. What changed:   when to take this  reasons to take this   furosemide 20 MG tablet Commonly known as: LASIX Take 3 tablets (60 mg total) by mouth daily.   gabapentin 300 MG capsule Commonly known as: NEURONTIN Take 2 capsules (600 mg total) by mouth 3 (three) times daily.   insulin aspart 100 UNIT/ML injection Commonly known as: novoLOG Inject 25 Units into the skin 3 (three) times daily with meals.   insulin glargine 100 UNIT/ML injection Commonly known as: LANTUS Inject 0.6 mLs (60 Units total) into the skin 2 (two) times daily. What changed: how much to take   ipratropium-albuterol 0.5-2.5 (3) MG/3ML Soln Commonly known as: DUONEB He is used 3 times daily for next 5 days, then 3 times  daily as needed   lisinopril 2.5 MG tablet Commonly known as: ZESTRIL Take 1 tablet (2.5 mg total) by mouth daily.   loratadine 10 MG tablet Commonly known as: CLARITIN Take 1 tablet (10 mg total) by mouth daily.   metoprolol tartrate 25 MG tablet Commonly known as: LOPRESSOR Take 0.5 tablets (12.5 mg total) by mouth 2 (two) times daily. What changed:   how much to take  when to take this   pantoprazole 40 MG tablet Commonly known as: PROTONIX Take 1 tablet (40 mg total) by mouth 2 (two) times daily.      Allergies  Allergen Reactions  . Morphine And Related Shortness Of Breath, Nausea And Vomiting, Rash and Other (See Comments)    Reaction:  Hallucinations   . Penicillins Anaphylaxis, Hives and Other (See Comments)    10/02/18 -  discussed with patient and he states he can tolerate penicillin capsules and had some hives when he was 78yo and received pcn injections  Has patient had a PCN reaction causing immediate rash, facial/tongue/throat swelling, SOB or lightheadedness with hypotension: Yes Has patient had a PCN reaction causing severe rash involving mucus membranes or skin necrosis: No Has patient had a PCN reaction that required hospitalization No Has patient had a PCN reaction occurring within the last 10 y  . Zolpidem Shortness Of Breath  . Demerol [Meperidine] Other (See Comments)    Reaction:  Hallucinations    . Dilaudid [Hydromorphone Hcl] Other (See Comments)    Reaction:  Hallucinations   . Levofloxacin Other (See Comments)    Reaction:  Unknown    Follow-up Information    [name], MD. Schedule an appointment as soon as possible for a visit in 1 week(s).   Specialty: Internal Medicine Contact information: [address] [place] [address] [phone]        Juanito Doom, MD. Schedule an appointment as soon as possible for a visit in 1 week(s).   Specialty: Pulmonary Disease Contact information: [place] [address] [phone]            The results of significant diagnostics from this hospitalization (including imaging, microbiology, ancillary and laboratory) are listed below for reference.    Significant Diagnostic Studies: X-ray Chest Pa And Lateral  Result Date: 04/28/2019 CLINICAL DATA:  Fever.  Chest congestion. EXAM: CHEST - 2 VIEW COMPARISON:  04/27/2019 and earlier exams. FINDINGS: Cardiac silhouette is normal in size. 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 Stable hiatal hernia. No mediastinal or hilar masses. Lungs are hyperexpanded. Left base atelectasis. No evidence of pneumonia or pulmonary edema. No convincing pleural effusion and no pneumothorax. Skeletal structures are demineralized but grossly intact. IMPRESSION: 1. No acute  cardiopulmonary disease. 2. Left lung base opacity is stable consistent with atelectasis. Electronically Signed   By: Lajean Manes M.D.   On: 04/28/2019 06:49   Dg Thoracic Spine 2 View  Result Date: 04/27/2019 CLINICAL DATA:  Pain after fall EXAM: THORACIC SPINE 2 VIEWS COMPARISON:  None. FINDINGS: There is no evidence of thoracic spine fracture. Alignment is normal. No other significant bone abnormalities are identified. IMPRESSION: Negative. Electronically Signed   By: Dorise Bullion III M.D   On: 04/27/2019 17:36   Dg Lumbar Spine Complete  Result Date: 04/27/2019 CLINICAL DATA:  Pain after fall EXAM: LUMBAR SPINE - COMPLETE 4+ VIEW COMPARISON:  CT of the chest March 30, 2019 FINDINGS: Mild anterior wedging of T12 is stable. No acute fracture or traumatic  malalignment. Multilevel degenerative changes. Lower lumbar facet degenerative changes. IMPRESSION: Degenerative changes as above. No acute fracture or traumatic malalignment. Mild wedging of T12 is nonacute. Electronically Signed   By: Dorise Bullion III M.D   On: 04/27/2019 17:35   Dg Abd 1 View  Result Date: 04/28/2019 CLINICAL DATA:  Initial evaluation for acute fever, constipation. EXAM: ABDOMEN - 1 VIEW COMPARISON:  Prior CT from 06/28/2017 FINDINGS: Visualized bowel gas pattern within normal limits without evidence for obstruction or ileus. No appreciable abnormal bowel wall thickening. Overall stool burden is moderate in nature. No soft tissue mass or abnormal calcification. Aorto bi-iliac stent endograft in place. Suture material overlies the left pelvis. No acute osseous finding. IMPRESSION: 1. Nonobstructive bowel gas pattern with no radiographic evidence for acute intra-abdominal process. 2. Moderate stool burden. Electronically Signed   By: Jeannine Boga M.D.   On: 04/28/2019 06:25   Ct Angio Chest Pe W And/or Wo Contrast  Addendum Date: 04/11/2019   ADDENDUM REPORT: 04/11/2019 12:26 ADDENDUM: There is mild central  bronchiectasis Electronically Signed   By: Suzy Bouchard M.D.   On: 04/11/2019 12:26   Result Date: 04/11/2019 CLINICAL DATA:  Elevated D-dimer.  Short of breath with exertion. EXAM: CT ANGIOGRAPHY CHEST WITH CONTRAST TECHNIQUE: Multidetector CT imaging of the chest was performed using the standard protocol during bolus administration of intravenous contrast. Multiplanar CT image reconstructions and MIPs were obtained to evaluate the vascular anatomy. CONTRAST:  182mL OMNIPAQUE IOHEXOL 350 MG/ML SOLN COMPARISON:  Chest CT 10/13/2018 FINDINGS: Cardiovascular:  Post CABG No filling defects within the pulmonary arteries to suggest acute pulmonary embolism. No acute findings of the aorta or great vessels. No pericardial fluid. Mediastinum/Nodes: No axillary supraclavicular adenopathy. No mediastinal hilar adenopathy. No pericardial effusion. Large hiatal hernia posterior LEFT heart. Lungs/Pleura: No suspicious pulmonary nodules. Paraseptal emphysema in the LEFT upper lobe upper lobe. Atelectasis in the LEFT lower lobe. Band of atelectasis in the RIGHT lower lobe Upper Abdomen: Limited view of the liver, kidneys, pancreas are unremarkable. Normal adrenal glands. Upper margin of abdominal aortic stent graft noted Musculoskeletal: No aggressive osseous lesion. Review of the MIP images confirms the above findings. IMPRESSION: 1. No evidence acute pulmonary embolism. 2. Atelectasis in LEFT lung and paraseptal emphysema in the LEFT upper lobe. No acute cardiopulmonary findings. 3. Large hiatal hernia. Electronically Signed: By: Suzy Bouchard M.D. On: 03/30/2019 12:56   Dg Chest Port 1 View  Result Date: 04/27/2019 CLINICAL DATA:  Status post fall. EXAM: PORTABLE CHEST 1 VIEW COMPARISON:  April 06, 2019 FINDINGS: Postsurgical changes from CABG. Cardiomediastinal silhouette is normal. Mediastinal contours appear intact. Known hiatal hernia. There is no evidence of focal airspace consolidation, pleural effusion or  pneumothorax. Left lower lobe atelectasis. Osseous structures are without acute abnormality. Soft tissues are grossly normal. IMPRESSION: Left lower lobe atelectasis. Electronically Signed   By: Fidela Salisbury M.D.   On: 04/27/2019 16:06   Dg Chest Portable 1 View  Result Date: 04/06/2019 CLINICAL DATA:  78 y/o M; worsening respiratory distress. History of COPD. EXAM: PORTABLE CHEST 1 VIEW COMPARISON:  03/30/2019 CT chest and chest radiograph. FINDINGS: Stable cardiac silhouette given projection and technique. Status post CABG. Large hiatal hernia. Left mid and lower lung zone linear, likely atelectasis. Emphysema. Aortic atherosclerosis with calcification. No pleural effusion or pneumothorax. No acute osseous abnormality is evident. IMPRESSION: Left mid and lower lung zone atelectasis. Large hiatal hernia. Aortic atherosclerosis. Electronically Signed   By: Kristine Garbe M.D.   On:  04/06/2019 06:15   Dg Chest Port 1 View  Result Date: 03/30/2019 CLINICAL DATA:  Shortness of breath. COPD. Congestive heart failure EXAM: PORTABLE CHEST 1 VIEW COMPARISON:  02/28/2019 FINDINGS: Stable cardiomegaly. Prior CABG. Pulmonary hyperinflation is consistent with COPD. Both lungs are grossly clear. Lordotic positioning noted. IMPRESSION: Stable cardiomegaly and COPD. No active lung disease. Electronically Signed   By: Earle Gell M.D.   On: 03/30/2019 11:56    Microbiology: Recent Results (from the past 240 hour(s))  SARS Coronavirus 2 (CEPHEID- Performed in [facility] hospital lab), Hosp Order     Status: None   Collection Time: 04/27/19  2:48 PM   Specimen: Nasopharyngeal Swab  Result Value Ref Range Status   SARS Coronavirus 2 NEGATIVE NEGATIVE Final    Comment: (NOTE) If result is NEGATIVE SARS-CoV-2 target nucleic acids are NOT DETECTED. The SARS-CoV-2 RNA is generally detectable in upper and lower  respiratory specimens during the acute phase of infection. The lowest  concentration of  SARS-CoV-2 viral copies this assay can detect is 250  copies / mL. A negative result does not preclude SARS-CoV-2 infection  and should not be used as the sole basis for treatment or other  patient management decisions.  A negative result may occur with  improper specimen collection / handling, submission of specimen other  than nasopharyngeal swab, presence of viral mutation(s) within the  areas targeted by this assay, and inadequate number of viral copies  (<250 copies / mL). A negative result must be combined with clinical  observations, patient history, and epidemiological information. If result is POSITIVE SARS-CoV-2 target nucleic acids are DETECTED. The SARS-CoV-2 RNA is generally detectable in upper and lower  respiratory specimens dur ing the acute phase of infection.  Positive  results are indicative of active infection with SARS-CoV-2.  Clinical  correlation with patient history and other diagnostic information is  necessary to determine patient infection status.  Positive results do  not rule out bacterial infection or co-infection with other viruses. If result is PRESUMPTIVE POSTIVE SARS-CoV-2 nucleic acids MAY BE PRESENT.   A presumptive positive result was obtained on the submitted specimen  and confirmed on repeat testing.  While 2019 novel coronavirus  (SARS-CoV-2) nucleic acids may be present in the submitted sample  additional confirmatory testing may be necessary for epidemiological  and / or clinical management purposes  to differentiate between  SARS-CoV-2 and other Sarbecovirus currently known to infect humans.  If clinically indicated additional testing with an alternate test  methodology [phone]) is advised. The SARS-CoV-2 RNA is generally  detectable in upper and lower respiratory sp ecimens during the acute  phase of infection. The expected result is Negative. Fact Sheet for Patients:  StrictlyIdeas.no Fact Sheet for Healthcare  Providers: BankingDealers.co.za This test is not yet approved or cleared by the [place] FDA and has been authorized for detection and/or diagnosis of SARS-CoV-2 by FDA under an Emergency Use Authorization (EUA).  This EUA will remain in effect (meaning this test can be used) for the duration of the COVID-19 declaration under Section 564(b)(1) of the Act, 21 U.S.C. section 360bbb-3(b)(1), unless the authorization is terminated or revoked sooner. Performed at [facility], [place] [address], [place], [place] [address]   Urine culture     Status: Abnormal   Collection Time: 04/27/19  3:22 PM   Specimen: Urine, Random  Result Value Ref Range Status   Specimen Description URINE, RANDOM  Final   Special Requests   Final  NONE Performed at [facility], [place] [address] [place], [place] [address]    Culture (A)  Final    10,000 COLONIES/mL MULTIPLE SPECIES PRESENT, SUGGEST RECOLLECTION   Report Status 04/28/2019 FINAL  Final     Labs: Basic Metabolic Panel: Recent Labs  Lab 04/27/19 1401 04/28/19 0257  NA 138 140  K 4.7 4.2  CL 94* 97*  CO2 33* 35*  GLUCOSE 172* 165*  BUN 9 12  CREATININE 1.10 1.15  CALCIUM 8.9 8.5*   Liver Function Tests: Recent Labs  Lab 04/27/19 1401  AST 17  ALT 13  ALKPHOS 103  BILITOT 0.5  PROT 6.2*  ALBUMIN 3.1*   No results for input(s): LIPASE, AMYLASE in the last 168 hours. No results for input(s): AMMONIA in the last 168 hours. CBC: Recent Labs  Lab 04/27/19 1401 04/28/19 0257  WBC 7.6 5.0  NEUTROABS 6.0  --   HGB 9.9* 9.4*  HCT 33.5* 32.2*  MCV 97.7 95.0  PLT 174 172   Cardiac Enzymes: No results for input(s): CKTOTAL, CKMB, CKMBINDEX, TROPONINI in the last 168 hours. BNP: BNP (last 3 results) Recent Labs    12/26/18 0608 02/28/19 0512 03/30/19 0933  BNP 236.6* 112.0* 212.9*    ProBNP (last 3 results) No results for input(s): PROBNP in the last 8760 hours.  CBG: Recent Labs   Lab 04/27/19 2102 04/28/19 0620 04/28/19 1140  GLUCAP 228* 192* 125*       Signed:  Domenic Polite MD.  [facility] 04/28/2019, 12:04 PM

## 2019-04-28 NOTE — Evaluation (Signed)
Physical Therapy Evaluation Patient Details Name: [name] MRN: [id number] DOB: 04-29-1941 Today's Date: 04/28/2019   History of Present Illness  [name] is a 78 y.o. male with medical history significant for chronic respiratory failure with hypoxia on supplemental O2, stage IV COPD, CAD status post CABG, chronic diastolic CHF, AAA status post endovascular repair, insulin-dependent type 2 diabetes, hypertension, hyperlipidemia, anemia, depression, anxiety, and bipolar disorder who presents to the ED for evaluation after a fall at home, getting up from the toilet; reports intermittent dizziness  Clinical Impression   Pt admitted with above diagnosis. Pt currently with functional limitations due to the deficits listed below (see PT Problem List). From home, where he was mostly sedintary, but walked household distance without RW; Presents with decr strength, decr activity tolerance, decr functional mobility, incr fall risk -- possible orthostasis (see numbers below;  Pt will benefit from skilled PT to increase their independence and safety with mobility to allow discharge to the venue listed below.    It is notable that his demeanor changed from pleasant, talkative, and cracking jokes to frustrated and almost agitated during session; the change occurred with increased time with upright, standing and walking activity; It is possible that he had a BP drop with more time in standing/upright activity, and that could lead to demeanor change and agitation -- unfortunately, I was unable to capture a standing BP at 3 minutes, but he higher SBP rebound in the next sitting BP could point to BP drop in standing for longer time;  Also possible that blood sugar had something to do with demeanor change, but  less likely as he had breakfast (though did not eat all of breakfast); I discussed these with his RN;   Upon chart review, Mr. Allnutt performance today compared to last admission shows a  significant functional decline in just a few weeks -- it is worth considering post-acute rehab at [facility] for him, though I anticipate he would decline.     Follow Up Recommendations Home health PT;Supervision/Assistance - 24 hour;Other (comment)(HHOT, HHRN (chronic disease management), HHSW)  Worth also considering SNF for post-acute rehab    Equipment Recommendations  None recommended by PT    Recommendations for Other Services Other (comment)([facility])     Precautions / Restrictions Precautions Precautions: Fall Restrictions Weight Bearing Restrictions: No      Mobility  Bed Mobility Overal bed mobility: Needs Assistance Bed Mobility: Supine to Sit     Supine to sit: Supervision Sit to supine: Supervision   General bed mobility comments: supervision for safety; inefficient movement, and noted dependent on bed rail; tended to hold breath  Transfers Overall transfer level: Needs assistance Equipment used: Rolling walker (2 wheeled) Transfers: Sit to/from Stand Sit to Stand: Min guard         General transfer comment: min guard for safety/balance, no physical support provided; braced backs of LEs on bed  Ambulation/Gait Ambulation/Gait assistance: Min guard Gait Distance (Feet): 15 Feet Assistive device: Rolling walker (2 wheeled) Gait Pattern/deviations: Decreased step length - right;Decreased step length - left     General Gait Details: Cues to self-monitor for activity tolerance; Heavy dependence on UE support on RW; I offered him a few choices: the choice of trying without RW, and the choice of when to turn around to return to the bed; His demeanor went from cracking jokes (some off color) to being frustrated (near agitated), and he refused to make the aforementioned choices; but he did follow my decisions  Stairs            Wheelchair Mobility    Modified Rankin (Stroke Patients Only)       Balance Overall balance assessment: Needs  assistance Sitting-balance support: No upper extremity supported;Feet supported Sitting balance-Leahy Scale: Good     Standing balance support: Bilateral upper extremity supported;During functional activity;No upper extremity supported Standing balance-Leahy Scale: Fair Standing balance comment: reliant on B UE support dynamically, requires min guard with 0 hand support standing                              Pertinent Vitals/Pain Pain Assessment: No/denies pain    Home Living Family/patient expects to be discharged to:: Private residence Living Arrangements: Spouse/significant other Available Help at Discharge: Family Type of Home: House Home Access: Stairs to enter Entrance Stairs-Rails: Psychiatric nurse of Steps: 6 Home Layout: One level Home Equipment: Environmental consultant - 2 wheels;Cane - single point;Bedside commode;Other (comment) Additional Comments: Noted, per PT note on 6/21: "pt reports he lays in bed most of the day, sits up on the EOB to eat "    Prior Function Level of Independence: Needs assistance   Gait / Transfers Assistance Needed: walks household distances without DME; has cane and RW, but doesn't use them  ADL's / Homemaking Assistance Needed: assist with LB self care, sponge bathes, no IADLs (spouse completes med mgmt)   Comments: pt sedentary     Hand Dominance   Dominant Hand: Right    Extremity/Trunk Assessment   Upper Extremity Assessment Upper Extremity Assessment: Defer to OT evaluation    Lower Extremity Assessment Lower Extremity Assessment: Generalized weakness       Communication   Communication: HOH  Cognition Arousal/Alertness: Awake/alert Behavior During Therapy: WFL for tasks assessed/performed;Restless(became restless, frustrated after spending time standing) Overall Cognitive Status: No family/caregiver present to determine baseline cognitive functioning Area of Impairment: Memory;Awareness;Problem  solving;Safety/judgement                     Memory: Decreased short-term memory   Safety/Judgement: Decreased awareness of safety Awareness: Emergent Problem Solving: Slow processing;Requires verbal cues General Comments: pt tangential, decreased STM and decreased safety awareness      General Comments General comments (skin integrity, edema, etc.):   04/28/19 0902 04/28/19 0916  Vital Signs  Patient Position (if appropriate) Orthostatic Vitals  --   Orthostatic Lying   BP- Lying 135/75  --   Pulse- Lying 80  --   Orthostatic Sitting  BP- Sitting 131/81 151/87  Pulse- Sitting 88 89  Orthostatic Standing at 0 minutes  BP- Standing at 0 minutes 125/71  --   Pulse- Standing at 0 minutes 92  --   Orthostatic Standing at 3 minutes  BP- Standing at 3 minutes  (unable to obtain)  --        Exercises     Assessment/Plan    PT Assessment Patient needs continued PT services  PT Problem List Decreased strength;Decreased range of motion;Decreased activity tolerance;Decreased balance;Decreased mobility;Decreased cognition;Decreased knowledge of use of DME;Decreased safety awareness;Decreased knowledge of precautions;Cardiopulmonary status limiting activity       PT Treatment Interventions DME instruction;Gait training;Stair training;Functional mobility training;Therapeutic activities;Therapeutic exercise;Balance training;Cognitive remediation;Patient/family education    PT Goals (Current goals can be found in the Care Plan section)  Acute Rehab PT Goals Patient Stated Goal: wanted his coffee fixed for him PT Goal Formulation: Patient unable to participate in goal  setting Time For Goal Achievement: 05/12/19 Potential to Achieve Goals: Good    Frequency Min 3X/week   Barriers to discharge Other (comment) Notable functional decline when comparing today's performance to notes from last admission; It seems his current home situation isn't serving him well;  Worth  considering SNF for rehab, and if he declines, then would like to maximize [facility] services (inclusing a SW)    Co-evaluation               AM-PAC PT "6 Clicks" Mobility  Outcome Measure Help needed turning from your back to your side while in a flat bed without using bedrails?: None Help needed moving from lying on your back to sitting on the side of a flat bed without using bedrails?: None Help needed moving to and from a bed to a chair (including a wheelchair)?: A Little Help needed standing up from a chair using your arms (e.g., wheelchair or bedside chair)?: A Little Help needed to walk in hospital room?: A Little Help needed climbing 3-5 steps with a railing? : A Lot 6 Click Score: 19    End of Session Equipment Utilized During Treatment: Gait belt Activity Tolerance: Other (comment)(Limited by frustration that ramped up midway through session) Patient left: in bed;with call bell/phone within reach;with bed alarm set;Other (comment)(sitting EOBB to drink coffee) Nurse Communication: Mobility status;Other (comment)(behavior during session; unable to get 3 min standing BP) PT Visit Diagnosis: Unsteadiness on feet (R26.81);Other abnormalities of gait and mobility (R26.89);Muscle weakness (generalized) (M62.81);History of falling (Z91.81);Repeated falls (R29.6);Difficulty in walking, not elsewhere classified (R26.2)    Time: 6759-1638 PT Time Calculation (min) (ACUTE ONLY): 31 min   Charges:   PT Evaluation $PT Eval Moderate Complexity: 1 Mod PT Treatments $Therapeutic Activity: 8-22 mins        Roney Marion, PT  Acute Rehabilitation Services Pager [phone] Office [phone]   Colletta [place] 04/28/2019, 9:55 AM

## 2019-04-30 ENCOUNTER — Telehealth: Payer: Self-pay | Admitting: Adult Health Nurse Practitioner

## 2019-04-30 ENCOUNTER — Telehealth: Payer: Self-pay | Admitting: Surgery

## 2019-04-30 ENCOUNTER — Other Ambulatory Visit: Payer: Self-pay | Admitting: *Deleted

## 2019-04-30 ENCOUNTER — Other Ambulatory Visit: Payer: Medicare HMO | Admitting: Adult Health Nurse Practitioner

## 2019-04-30 ENCOUNTER — Other Ambulatory Visit: Payer: Self-pay

## 2019-04-30 DIAGNOSIS — Z515 Encounter for palliative care: Secondary | ICD-10-CM

## 2019-04-30 DIAGNOSIS — I714 Abdominal aortic aneurysm, without rupture, unspecified: Secondary | ICD-10-CM

## 2019-04-30 DIAGNOSIS — R509 Fever, unspecified: Secondary | ICD-10-CM

## 2019-04-30 NOTE — Telephone Encounter (Signed)
Called pt to sch appt with lab. Pt is unable to get to the office early enough for his exam. 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 I told the pt to spk to his wife and call the office back to see what we could do.

## 2019-04-30 NOTE — Telephone Encounter (Signed)
Spoke with patient and have scheduled a Telephone Palliative consult for 04/30/19 @ 2 PM.

## 2019-04-30 NOTE — Progress Notes (Signed)
[facility] Consult Note Telephone: [phone]  Fax: [phone]  PATIENT NAME: [name] DOB: [birth year] MRN: [id number]  PRIMARY CARE PROVIDER:   [name], MD  REFERRING PROVIDER:  [name], MD [place],  [place] [address]  RESPONSIBLE PARTY:   Self [phone]    Doak Mah, wife [phone]    RECOMMENDATIONS and PLAN:  1. End stage COPD.  Patient in hospital 6/20-6/27/2020 for COPD exacerbation.  He was also briefly in hospital on 7/11-7/09/2019 after a fall in his bathroom.  Patient currently uses O2 @ 7 L via nasal canula continuous.  He states that he has what he thinks is a BiPAP machine that he is supposed to use at night.  He is not fully sure what kind of machine it is.  States that whoever came to set it up showed him how to use it but went through it so quickly that he doesn't remember.  Not sure what medical supply company helped him set up the machine but patient would benefit from further instruction.  He also states that he needs additional and longer tubing for his oxygen.  States that he just hasn't gotten around to calling the supply company about it.  Encouraged him to call the supply company but not sure he will.  Will reach out to Dr. Santiago Bur office to see if I can get the name and number of supply company so I can call and make sure patient gets the instruction and supplies he needs.    2.  Mobility.  When he was in the hospital in June he was independent of ADLs.  After his fall this month he states that his groin hurts making it hard to get in and out of bed.  States having a hard time getting to the bathroom but has a bedside commode.  States that imaging did not show any fracture.  States that Tylenol does help a little bit with the pain.  States too that the pain lessens each day.    3.  Goals of care.  Patient listed as DNR on documentation from palliative consult from  hospital.  Patient was not getting a clear understand of the roll of palliative care.  Tried explaining.  Have in person visit next week to go over further and offer literature on palliative care.  Of note, patient spent a lot of time going over his life stories and it was difficult to stay on topic and he did get upset with me at one point when he thought I interrupted him.  He was talking and there was a long pause and thought he was done; I did apologize and we continued our conversation.  I spent 60 minutes providing this consultation,  from 2:00 to 3:00. More than 50% of the time in this consultation was spent coordinating communication.   HISTORY OF PRESENT ILLNESS:  [name] is a 78 y.o. year old male with multiple medical problems including end stage COPD, CHF, HTN, CAD, DMT2, bipolar. Palliative Care was asked to help address goals of care.   CODE STATUS: DNR  PPS: 60% HOSPICE ELIGIBILITY/DIAGNOSIS: TBD  PAST MEDICAL HISTORY:  Past Medical History:  Diagnosis Date  . AAA (abdominal aortic aneurysm) ([place])    a. 12/2008 s/p 7cm, endovascular repair with coiling right hypogastric artery   . Acute [place] ulcer   . Adenomatous duodenal polyp   . Allergic rhinitis, cause unspecified   . Anxiety   .  AVM (arteriovenous malformation) of colon with hemorrhage   . Bipolar 1 disorder, mixed, moderate ([place]) 04/16/2015  . CAD (coronary artery disease)    a. 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  . Chronic diastolic CHF (congestive heart failure) ([place])    a. 04/2015 Echo: EF 55-60%, no rwma, Gr 1 DD, mild AI.  Marland Kitchen Complication of anesthesia    "if they sedate me for too long, they have to intubate me; then they can't get me to come out of it" (04/03/2017)  . COPD (chronic obstructive pulmonary disease) ([place])    a. GOLD stage IV, started home O2. Severe bullous disease of LUL. Prolonged intubation after surgeries due to COPD.  Marland Kitchen Depression with anxiety 01/14/2013   . Diabetes mellitus with complication ([place])   . Diverticulosis   . Duodenal diverticulum   . Duodenal ulcer   . Emphysema of lung ([place])   . Esophagitis   . Essential hypertension 08/18/2009   Qualifier: Diagnosis of  By: Doy Mince LPN, Megan    . GERD (gastroesophageal reflux disease)   . GI bleed requiring more than 4 units of blood in 24 hours, ICU, or surgery    a. Hx bleeding gastric polyps, cecal & sigmoid AVMS s/p APC 03/30/14  . Hiatal hernia    large  . History of blood transfusion    "many many many; related to blood loss; anemia"  . Hyperlipidemia   . Insomnia 08/10/2014  . Leucocytosis 12/04/2013  . Major depressive disorder   . Memory loss   . Morbid obesity ([place])   . Multiple gastric polyps   . Myocardial infarction ([place])    "I think I had a minor one when I had the OHS"  . On home oxygen therapy    "7 liters [place] w/oxigenator" (04/03/2017)  . Pneumonia 2017  . Recurrent Microcytic Anemia    a. presumed chronic GI blood loss.  . Type II diabetes mellitus ([place])   . Vitamin D deficiency 08/10/2014    SOCIAL HX:  Social History   Tobacco Use  . Smoking status: Former Smoker    Packs/day: 2.00    Years: 50.00    Pack years: 100.00    Types: Cigarettes    Quit date: 11/18/2008    Years since quitting: 10.4  . Smokeless tobacco: Never Used  Substance Use Topics  . Alcohol use: No    Alcohol/week: 0.0 standard drinks    Comment: quit in ~ 2010    ALLERGIES:  Allergies  Allergen Reactions  . Morphine And Related Shortness Of Breath, Nausea And Vomiting, Rash and Other (See Comments)    Reaction:  Hallucinations   . Penicillins Anaphylaxis, Hives and Other (See Comments)    10/02/18 - discussed with patient and he states he can tolerate penicillin capsules and had some hives when he was 78yo and received pcn injections  Has patient had a PCN reaction causing immediate rash, facial/tongue/throat swelling, SOB or lightheadedness with hypotension: Yes Has patient  had a PCN reaction causing severe rash involving mucus membranes or skin necrosis: No Has patient had a PCN reaction that required hospitalization No Has patient had a PCN reaction occurring within the last 10 y  . Zolpidem Shortness Of Breath  . Demerol [Meperidine] Other (See Comments)    Reaction:  Hallucinations    . Dilaudid [Hydromorphone Hcl] Other (See Comments)    Reaction:  Hallucinations   . Levofloxacin Other (See Comments)    Reaction:  Unknown      PERTINENT MEDICATIONS:  Outpatient Encounter Medications as of 04/30/2019  Medication Sig  . acetaminophen (TYLENOL) 500 MG tablet Take 1,000 mg by mouth every 6 (six) hours as needed for fever or headache (pain).  Marland Kitchen albuterol (PROVENTIL HFA;VENTOLIN HFA) 108 (90 BASE) MCG/ACT inhaler Inhale 2 puffs into the lungs every 6 (six) hours as needed for wheezing or shortness of breath.  Marland Kitchen albuterol (PROVENTIL) (2.5 MG/3ML) 0.083% nebulizer solution Inhale 3 mLs (2.5 mg total) into the lungs every 2 (two) hours as needed for wheezing or shortness of breath. (Patient not taking: Reported on 04/27/2019)  . ALPRAZolam (XANAX) 0.25 MG tablet Take 1 tablet (0.25 mg total) by mouth 3 (three) times daily as needed for anxiety or sleep. (Patient not taking: Reported on 04/27/2019)  . atorvastatin (LIPITOR) 10 MG tablet Take 1 tablet (10 mg total) by mouth daily at 6 PM.  . citalopram (CELEXA) 40 MG tablet Take 0.5 tablets (20 mg total) by mouth daily. (Patient taking differently: Take 40 mg by mouth daily. )  . [employer] MELATONIN 5 MG TABS Take 5 mg by mouth at bedtime. For sleep  . docusate sodium (COLACE) 100 MG capsule Take 1 capsule (100 mg total) by mouth 2 (two) times daily.  . ferrous sulfate 325 (65 FE) MG tablet Take 1 tablet (325 mg total) by mouth 3 (three) times daily with meals. (Patient not taking: Reported on 04/27/2019)  . fluticasone (FLONASE) 50 MCG/ACT nasal spray Place 2 sprays into both nostrils daily. (Patient taking differently: Place 2  sprays into both nostrils daily as needed for allergies or rhinitis. )  . furosemide (LASIX) 20 MG tablet Take 3 tablets (60 mg total) by mouth daily.  Marland Kitchen gabapentin (NEURONTIN) 300 MG capsule Take 2 capsules (600 mg total) by mouth 3 (three) times daily.  . insulin aspart (NOVOLOG) 100 UNIT/ML injection Inject 25 Units into the skin 3 (three) times daily with meals.  . insulin glargine (LANTUS) 100 UNIT/ML injection Inject 0.6 mLs (60 Units total) into the skin 2 (two) times daily. (Patient taking differently: Inject 50 Units into the skin 2 (two) times daily. )  . ipratropium-albuterol (DUONEB) 0.5-2.5 (3) MG/3ML SOLN He is used 3 times daily for next 5 days, then 3 times daily as needed (Patient not taking: Reported on 04/27/2019)  . lisinopril (ZESTRIL) 2.5 MG tablet Take 1 tablet (2.5 mg total) by mouth daily.  Marland Kitchen loratadine (CLARITIN) 10 MG tablet Take 1 tablet (10 mg total) by mouth daily.  . metoprolol tartrate (LOPRESSOR) 25 MG tablet Take 0.5 tablets (12.5 mg total) by mouth 2 (two) times daily. (Patient taking differently: Take 25 mg by mouth daily. )  . mometasone-formoterol (DULERA) 200-5 MCG/ACT AERO Inhale 2 puffs into the lungs 2 (two) times daily as needed for wheezing or shortness of breath.  . pantoprazole (PROTONIX) 40 MG tablet Take 1 tablet (40 mg total) by mouth 2 (two) times daily.   No facility-administered encounter medications on file as of 04/30/2019.       Kinzley Savell Jenetta Downer, NP

## 2019-05-02 LAB — CULTURE, BLOOD (ROUTINE X 2)
Culture: NO GROWTH
Culture: NO GROWTH
Special Requests: ADEQUATE
Special Requests: ADEQUATE

## 2019-05-05 ENCOUNTER — Emergency Department (HOSPITAL_COMMUNITY): Payer: Medicare HMO

## 2019-05-05 ENCOUNTER — Encounter (HOSPITAL_COMMUNITY): Payer: Self-pay | Admitting: Emergency Medicine

## 2019-05-05 ENCOUNTER — Observation Stay (HOSPITAL_COMMUNITY): Payer: Medicare HMO

## 2019-05-05 ENCOUNTER — Other Ambulatory Visit: Payer: Self-pay

## 2019-05-05 ENCOUNTER — Observation Stay (HOSPITAL_COMMUNITY)
Admission: EM | Admit: 2019-05-05 | Discharge: 2019-05-06 | Disposition: A | Discharge status: Home / Self Care | Payer: Medicare HMO | Attending: Internal Medicine | Admitting: Internal Medicine

## 2019-05-05 DIAGNOSIS — I5032 Chronic diastolic (congestive) heart failure: Secondary | ICD-10-CM | POA: Diagnosis not present

## 2019-05-05 DIAGNOSIS — Z20828 Contact with and (suspected) exposure to other viral communicable diseases: Secondary | ICD-10-CM | POA: Diagnosis not present

## 2019-05-05 DIAGNOSIS — Y999 Unspecified external cause status: Secondary | ICD-10-CM | POA: Insufficient documentation

## 2019-05-05 DIAGNOSIS — G459 Transient cerebral ischemic attack, unspecified: Secondary | ICD-10-CM | POA: Diagnosis not present

## 2019-05-05 DIAGNOSIS — R531 Weakness: Secondary | ICD-10-CM | POA: Diagnosis not present

## 2019-05-05 DIAGNOSIS — J449 Chronic obstructive pulmonary disease, unspecified: Secondary | ICD-10-CM | POA: Insufficient documentation

## 2019-05-05 DIAGNOSIS — Z79899 Other long term (current) drug therapy: Secondary | ICD-10-CM | POA: Diagnosis not present

## 2019-05-05 DIAGNOSIS — I110 Hypertensive heart disease with heart failure: Secondary | ICD-10-CM | POA: Diagnosis not present

## 2019-05-05 DIAGNOSIS — Y939 Activity, unspecified: Secondary | ICD-10-CM | POA: Diagnosis not present

## 2019-05-05 DIAGNOSIS — S161XXA Strain of muscle, fascia and tendon at neck level, initial encounter: Secondary | ICD-10-CM | POA: Diagnosis not present

## 2019-05-05 DIAGNOSIS — J9611 Chronic respiratory failure with hypoxia: Secondary | ICD-10-CM

## 2019-05-05 DIAGNOSIS — S060X0A Concussion without loss of consciousness, initial encounter: Secondary | ICD-10-CM | POA: Insufficient documentation

## 2019-05-05 DIAGNOSIS — R51 Headache: Secondary | ICD-10-CM | POA: Diagnosis present

## 2019-05-05 DIAGNOSIS — I10 Essential (primary) hypertension: Secondary | ICD-10-CM

## 2019-05-05 DIAGNOSIS — Y929 Unspecified place or not applicable: Secondary | ICD-10-CM | POA: Diagnosis not present

## 2019-05-05 DIAGNOSIS — E119 Type 2 diabetes mellitus without complications: Secondary | ICD-10-CM | POA: Diagnosis not present

## 2019-05-05 DIAGNOSIS — D509 Iron deficiency anemia, unspecified: Secondary | ICD-10-CM | POA: Diagnosis not present

## 2019-05-05 DIAGNOSIS — W19XXXA Unspecified fall, initial encounter: Secondary | ICD-10-CM | POA: Diagnosis not present

## 2019-05-05 DIAGNOSIS — Z794 Long term (current) use of insulin: Secondary | ICD-10-CM

## 2019-05-05 LAB — GLUCOSE, CAPILLARY
Glucose-Capillary: 106 mg/dL — ABNORMAL HIGH (ref 70–99)
Glucose-Capillary: 116 mg/dL — ABNORMAL HIGH (ref 70–99)
Glucose-Capillary: 123 mg/dL — ABNORMAL HIGH (ref 70–99)

## 2019-05-05 LAB — CBC WITH DIFFERENTIAL/PLATELET
Abs Immature Granulocytes: 0.4 10*3/uL — ABNORMAL HIGH (ref 0.00–0.07)
Basophils Absolute: 0 10*3/uL (ref 0.0–0.1)
Basophils Relative: 0 %
Eosinophils Absolute: 0.1 10*3/uL (ref 0.0–0.5)
Eosinophils Relative: 1 %
HCT: 33.7 % — ABNORMAL LOW (ref 39.0–52.0)
Hemoglobin: 10.3 g/dL — ABNORMAL LOW (ref 13.0–17.0)
Immature Granulocytes: 4 %
Lymphocytes Relative: 6 %
Lymphs Abs: 0.6 10*3/uL — ABNORMAL LOW (ref 0.7–4.0)
MCH: 28.2 pg (ref 26.0–34.0)
MCHC: 30.6 g/dL (ref 30.0–36.0)
MCV: 92.3 fL (ref 80.0–100.0)
Monocytes Absolute: 0.9 10*3/uL (ref 0.1–1.0)
Monocytes Relative: 9 %
Neutro Abs: 8 10*3/uL — ABNORMAL HIGH (ref 1.7–7.7)
Neutrophils Relative %: 80 %
Platelets: 230 10*3/uL (ref 150–400)
RBC: 3.65 MIL/uL — ABNORMAL LOW (ref 4.22–5.81)
RDW: 15 % (ref 11.5–15.5)
WBC: 10 10*3/uL (ref 4.0–10.5)
nRBC: 0 % (ref 0.0–0.2)

## 2019-05-05 LAB — RAPID URINE DRUG SCREEN, HOSP PERFORMED
Amphetamines: NOT DETECTED
Barbiturates: NOT DETECTED
Benzodiazepines: NOT DETECTED
Cocaine: NOT DETECTED
Opiates: NOT DETECTED
Tetrahydrocannabinol: NOT DETECTED

## 2019-05-05 LAB — URINALYSIS, ROUTINE W REFLEX MICROSCOPIC
Bacteria, UA: NONE SEEN
Bilirubin Urine: NEGATIVE
Glucose, UA: NEGATIVE mg/dL
Hgb urine dipstick: NEGATIVE
Ketones, ur: 5 mg/dL — AB
Leukocytes,Ua: NEGATIVE
Nitrite: NEGATIVE
Protein, ur: 100 mg/dL — AB
Specific Gravity, Urine: 1.014 (ref 1.005–1.030)
pH: 7 (ref 5.0–8.0)

## 2019-05-05 LAB — BASIC METABOLIC PANEL
Anion gap: 12 (ref 5–15)
BUN: 11 mg/dL (ref 8–23)
CO2: 33 mmol/L — ABNORMAL HIGH (ref 22–32)
Calcium: 9.3 mg/dL (ref 8.9–10.3)
Chloride: 88 mmol/L — ABNORMAL LOW (ref 98–111)
Creatinine, Ser: 1.17 mg/dL (ref 0.61–1.24)
GFR calc Af Amer: >60 mL/min (ref >60)
GFR calc non Af Amer: 60 mL/min — ABNORMAL LOW (ref >60)
Glucose, Bld: 174 mg/dL — ABNORMAL HIGH (ref 70–99)
Potassium: 4.3 mmol/L (ref 3.5–5.1)
Sodium: 133 mmol/L — ABNORMAL LOW (ref 135–145)

## 2019-05-05 LAB — PROTIME-INR
INR: 1.1 (ref 0.8–1.2)
Prothrombin Time: 13.7 seconds (ref 11.4–15.2)

## 2019-05-05 LAB — ETHANOL: Alcohol, Ethyl (B): <10 mg/dL (ref <10)

## 2019-05-05 LAB — SARS CORONAVIRUS 2 BY RT PCR (HOSPITAL ORDER, PERFORMED IN ~~LOC~~ HOSPITAL LAB): SARS Coronavirus 2: NEGATIVE

## 2019-05-05 MED ORDER — SENNOSIDES-DOCUSATE SODIUM 8.6-50 MG PO TABS: 1.0000 | ORAL_TABLET | Freq: Every evening | ORAL | Status: DC | PRN

## 2019-05-05 MED ORDER — ACETAMINOPHEN 650 MG RE SUPP: 650.0000 mg | RECTAL | Status: DC | PRN

## 2019-05-05 MED ORDER — ACETAMINOPHEN 325 MG PO TABS
650.0000 mg | ORAL_TABLET | ORAL | Status: DC | PRN
  Administered 2019-05-05 – 2019-05-06 (×2): 650 mg via ORAL
  Filled 2019-05-05 (×2): qty 2

## 2019-05-05 MED ORDER — ENOXAPARIN SODIUM 40 MG/0.4ML ~~LOC~~ SOLN
40.0000 mg | SUBCUTANEOUS | Status: DC
  Administered 2019-05-05 – 2019-05-06 (×2): 40 mg via SUBCUTANEOUS
  Filled 2019-05-05 (×2): qty 0.4

## 2019-05-05 MED ORDER — GABAPENTIN 300 MG PO CAPS
600.0000 mg | ORAL_CAPSULE | Freq: Three times a day (TID) | ORAL | Status: DC
  Administered 2019-05-05 – 2019-05-06 (×4): 600 mg via ORAL
  Filled 2019-05-05 (×4): qty 2

## 2019-05-05 MED ORDER — CITALOPRAM HYDROBROMIDE 10 MG PO TABS
20.0000 mg | ORAL_TABLET | Freq: Every day | ORAL | Status: DC
  Administered 2019-05-05 – 2019-05-06 (×2): 20 mg via ORAL
  Filled 2019-05-05 (×2): qty 2

## 2019-05-05 MED ORDER — FUROSEMIDE 40 MG PO TABS
60.0000 mg | ORAL_TABLET | Freq: Every day | ORAL | Status: DC
  Administered 2019-05-05 – 2019-05-06 (×2): 60 mg via ORAL
  Filled 2019-05-05 (×2): qty 1

## 2019-05-05 MED ORDER — METOPROLOL TARTRATE 12.5 MG HALF TABLET
12.5000 mg | ORAL_TABLET | Freq: Two times a day (BID) | ORAL | Status: DC
  Administered 2019-05-05 – 2019-05-06 (×3): 12.5 mg via ORAL
  Filled 2019-05-05 (×3): qty 1

## 2019-05-05 MED ORDER — ATORVASTATIN CALCIUM 10 MG PO TABS
10.0000 mg | ORAL_TABLET | Freq: Every day | ORAL | Status: DC
  Administered 2019-05-05 – 2019-05-06 (×2): 10 mg via ORAL
  Filled 2019-05-05 (×2): qty 1

## 2019-05-05 MED ORDER — MOMETASONE FURO-FORMOTEROL FUM 200-5 MCG/ACT IN AERO
2.0000 | INHALATION_SPRAY | Freq: Two times a day (BID) | RESPIRATORY_TRACT | Status: DC
  Administered 2019-05-05 – 2019-05-06 (×2): 2 via RESPIRATORY_TRACT
  Filled 2019-05-05: qty 8.8

## 2019-05-05 MED ORDER — ACETAMINOPHEN 500 MG PO TABS: 1000.0000 mg | ORAL_TABLET | Freq: Four times a day (QID) | ORAL | Status: DC | PRN

## 2019-05-05 MED ORDER — ALBUTEROL SULFATE HFA 108 (90 BASE) MCG/ACT IN AERS: 2.0000 | INHALATION_SPRAY | Freq: Four times a day (QID) | RESPIRATORY_TRACT | Status: DC | PRN

## 2019-05-05 MED ORDER — IPRATROPIUM-ALBUTEROL 0.5-2.5 (3) MG/3ML IN SOLN: 3.0000 mL | RESPIRATORY_TRACT | Status: DC | PRN

## 2019-05-05 MED ORDER — PANTOPRAZOLE SODIUM 40 MG PO TBEC
40.0000 mg | DELAYED_RELEASE_TABLET | Freq: Two times a day (BID) | ORAL | Status: DC
  Administered 2019-05-05 – 2019-05-06 (×3): 40 mg via ORAL
  Filled 2019-05-05 (×3): qty 1

## 2019-05-05 MED ORDER — INSULIN ASPART 100 UNIT/ML ~~LOC~~ SOLN
0.0000 [IU] | Freq: Three times a day (TID) | SUBCUTANEOUS | Status: DC
  Administered 2019-05-05 – 2019-05-06 (×2): 3 [IU] via SUBCUTANEOUS

## 2019-05-05 MED ORDER — DOCUSATE SODIUM 100 MG PO CAPS: 100.0000 mg | ORAL_CAPSULE | Freq: Two times a day (BID) | ORAL | Status: DC

## 2019-05-05 MED ORDER — STROKE: EARLY STAGES OF RECOVERY BOOK
Freq: Once | Status: AC
  Administered 2019-05-05: 13:00:00
  Filled 2019-05-05: qty 1

## 2019-05-05 MED ORDER — FLUTICASONE PROPIONATE 50 MCG/ACT NA SUSP
2.0000 | Freq: Every day | NASAL | Status: DC
  Administered 2019-05-06: 2 via NASAL
  Filled 2019-05-05: qty 16

## 2019-05-05 MED ORDER — ACETAMINOPHEN 160 MG/5ML PO SOLN: 650.0000 mg | ORAL | Status: DC | PRN

## 2019-05-05 MED ORDER — LORATADINE 10 MG PO TABS
10.0000 mg | ORAL_TABLET | Freq: Every day | ORAL | Status: DC
  Administered 2019-05-05 – 2019-05-06 (×2): 10 mg via ORAL
  Filled 2019-05-05 (×2): qty 1

## 2019-05-05 MED ORDER — MELATONIN 3 MG PO TABS
3.0000 mg | ORAL_TABLET | Freq: Every day | ORAL | Status: DC
  Administered 2019-05-05: 3 mg via ORAL
  Filled 2019-05-05 (×2): qty 1

## 2019-05-05 MED ORDER — INSULIN GLARGINE 100 UNIT/ML ~~LOC~~ SOLN
35.0000 [IU] | Freq: Two times a day (BID) | SUBCUTANEOUS | Status: DC
  Administered 2019-05-05: 35 [IU] via SUBCUTANEOUS
  Administered 2019-05-05: 15 [IU] via SUBCUTANEOUS
  Administered 2019-05-06: 35 [IU] via SUBCUTANEOUS
  Filled 2019-05-05 (×5): qty 0.35

## 2019-05-05 NOTE — ED Provider Notes (Signed)
Signout from Dr. Christy Gentles.  78 year old male here after a fall. Physical Exam  BP 121/84   Pulse 94   Temp (!) 97.5 F (36.4 C) (Tympanic)   Resp 17   Ht 6\' 1"  (1.854 m)   Wt 127 kg   SpO2 91%   BMI 36.94 kg/m   Physical Exam  ED Course/Procedures     Procedures  MDM  He has been accepted by the hospitalist service for admission.  He is still pending Covid testing to help assist with bed assignment.       Hayden Rasmussen, MD 05/05/19 1728
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
## 2019-05-05 NOTE — ED Triage Notes (Signed)
Pt transported from home by [facility] after fall while ambulating at home @ 0230, pt was off home 02 @ 1 hour, pt advised EMS he noticed L sided facial droop onset 2000 yesterday. No other deficits. Denies HA/blurred vision/ no blood thinners/ no obvious trauma, baseline LE weakness, uses walker at home.
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
## 2019-05-05 NOTE — Progress Notes (Signed)
Spoke with radiologist Dr Collins Scotland about patient's unkown stent at 2025, per Dr Collins Scotland as long as it was not put in in the last month it is ok to do MRI

## 2019-05-06 ENCOUNTER — Observation Stay (HOSPITAL_BASED_OUTPATIENT_CLINIC_OR_DEPARTMENT_OTHER): Payer: Medicare HMO

## 2019-05-06 DIAGNOSIS — I351 Nonrheumatic aortic (valve) insufficiency: Secondary | ICD-10-CM

## 2019-05-06 DIAGNOSIS — I361 Nonrheumatic tricuspid (valve) insufficiency: Secondary | ICD-10-CM

## 2019-05-06 DIAGNOSIS — G459 Transient cerebral ischemic attack, unspecified: Secondary | ICD-10-CM

## 2019-05-06 DIAGNOSIS — R531 Weakness: Secondary | ICD-10-CM

## 2019-05-06 LAB — LIPID PANEL
Cholesterol: 177 mg/dL (ref 0–200)
HDL: 31 mg/dL — ABNORMAL LOW (ref >40)
LDL Cholesterol: 100 mg/dL — ABNORMAL HIGH (ref 0–99)
Total CHOL/HDL Ratio: 5.7 RATIO
Triglycerides: 228 mg/dL — ABNORMAL HIGH (ref <150)
VLDL: 46 mg/dL — ABNORMAL HIGH (ref 0–40)

## 2019-05-06 LAB — ECHOCARDIOGRAM COMPLETE
Height: 73 in
Weight: 4479.75 oz

## 2019-05-06 LAB — GLUCOSE, CAPILLARY
Glucose-Capillary: 112 mg/dL — ABNORMAL HIGH (ref 70–99)
Glucose-Capillary: 119 mg/dL — ABNORMAL HIGH (ref 70–99)
Glucose-Capillary: 143 mg/dL — ABNORMAL HIGH (ref 70–99)
Glucose-Capillary: 107 mg/dL — ABNORMAL HIGH (ref 70–99)

## 2019-05-06 LAB — HEMOGLOBIN A1C
Hgb A1c MFr Bld: 6.4 % — ABNORMAL HIGH (ref 4.8–5.6)
Mean Plasma Glucose: 136.98 mg/dL

## 2019-05-06 IMAGING — DX DG CHEST 1V PORT
1 series · 1 of 1 positions shown · non-contrast
Comparison: Chest radiograph February 08, 2018

CLINICAL DATA: Shortness of breath today. History of cardiac
surgery, hypertension.

EXAM:
PORTABLE CHEST 1 VIEW

[chest ap]
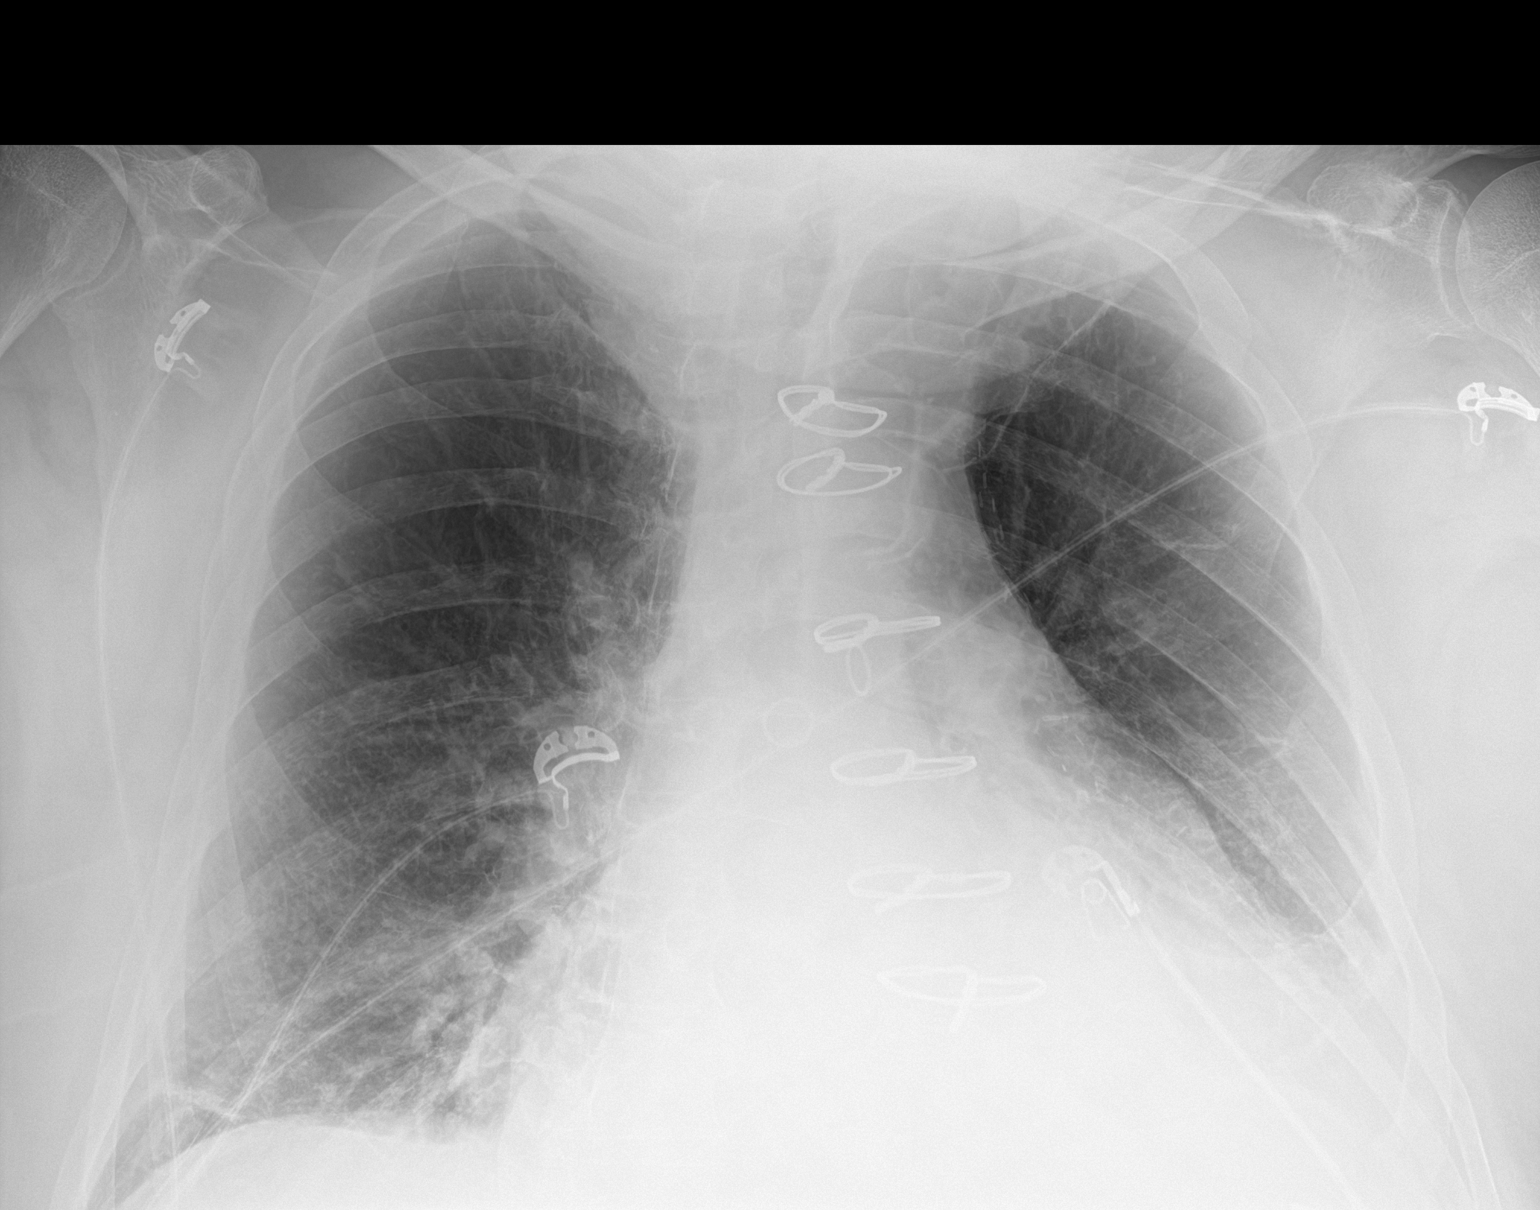

[1 of 1 positions shown; findings below may reference images not displayed]

FINDINGS: Stable cardiomegaly. Status post median sternotomy for CABG.
Calcified aortic arch. Pulmonary vascular congestion and increased
interstitial prominence. RIGHT lung base bandlike density. LEFT lung
base out of field of view, small LEFT pleural effusion. Biapical
bullous changes. No pneumothorax. Osseous structures are non
suspicious.
IMPRESSION: 1. Stable cardiomegaly. Increasing interstitial prominence most
consistent with pulmonary edema. Small LEFT pleural effusion.
2. RIGHT lung base atelectasis/scarring.
3.  Emphysema (PX3WO-LT4.X).  Aortic Atherosclerosis (PX3WO-GXJ.J).

## 2019-05-06 MED ORDER — INSULIN GLARGINE 100 UNIT/ML ~~LOC~~ SOLN: 35.0000 [IU] | Freq: Two times a day (BID) | SUBCUTANEOUS | 0 refills | Status: DC

## 2019-05-06 NOTE — Care Management Obs Status (Signed)
[facility] NOTIFICATION   Patient Details  Name: [name] MRN: [id number] Date of Birth: Oct 29, 1940   Medicare Observation Status Notification Given:  Yes    Bartholomew Crews, RN 05/06/2019, 4:50 PM
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
## 2019-05-06 NOTE — Progress Notes (Signed)
Manufacturing engineer [facility]) Hospital Liaison note:  This is an active Palliative patient with [facility]. [facility] Palliative will follow up with him after discharge.   If you have any questions please contact [facility] hospital liaison.  Thank you,  Farrel Gordon, RN, CCM [place] (listed on [place]) [phone]

## 2019-05-06 NOTE — Progress Notes (Signed)
All of a sudden became upset and started swearing at this nurse because this nurse informed him that she needed to do his orthostatic vitals.  He said that is stupid and was wondering what it was needed for.  This nurse informed him that the doctor ordered it so that he could ensure that he was not orthostatic so he could go home.  He again started  Swearing and said he does not have to go anywhere.  This nurse reminded him that he said he was not going to rehab or anywhere else but home, as he said that early this AM to the therapist, the tech, and to this nurse.  He said this nurse was a fat liar who was just lying on him and who got to the doctor and told him that lie and now he is being forced to go home.  This nurse informed him that she did not even speak to the doctor about this but that he was the one who told everyone he wanted to go home and that he does not need to be here any longer so that is the reason he is being sent home.  He then started calling this nurse and the doctor inapprop names and swearing.  This nurse asked him to refrain from yelling and swearing at her and he said, "You go to hell!"  "I own you!"  "You work for me!"  He said "You get that stupid doctor in here right now!"  This nurse then said she will return when he can refrain from speaking to her in that manner and she excused herself from the room.  Dr [name] was notified of what happened along with Charge Nurse Anderson Malta, who notified Assistant Manager Gwyndolyn Saxon.

## 2019-05-06 NOTE — TOC Initial Note (Signed)
Transition of Care [facility]) - Initial/Assessment Note    Patient Details  Name: [name] MRN: [id number] Date of Birth: 08-Jun-1941  Transition of [facility]) CM/SW Contact:    Bartholomew Crews, RN Phone Number: [phone] 05/06/2019, 2:26 PM  Clinical Narrative:                 Patient admitted for observation. Unable to complete initial assessment at bedside d/t patient aggressive behavior at this time. Received call from liaison at [facility] - patient is currently active for skilled nurse, PT, and aide. Advised that patient was observation, but orders were put in for RN, PT, OT.   Expected Discharge Plan: [place] Barriers to Discharge: No Barriers Identified   Patient Goals and CMS Choice Patient states their goals for this hospitalization and ongoing recovery are:: return home CMS Medicare.gov Compare Post Acute Care list provided to:: Patient Choice offered to / list presented to : Patient  Expected Discharge Plan and Services Expected Discharge Plan: [place] In-house Referral: NA Discharge Planning Services: CM Consult Post Acute Care Choice: Resumption of Svcs/PTA Provider Living arrangements for the past 2 months: Single Family Home Expected Discharge Date: 05/06/19               DME Arranged: N/A DME Agency: NA       HH Arranged: RN, PT, OT [place] Agency: [place] ([place]) Date [place]: 05/06/19 Time [place]: 68 Representative spoke with at [place]: Butch Penny  Prior Living Arrangements/Services Living arrangements for the past 2 months: [place] with:: Self, Spouse Patient language and need for interpreter reviewed:: Yes            Current home services: Home PT, Home RN, Homehealth aide Criminal Activity/Legal Involvement Pertinent to Current Situation/Hospitalization: No - Comment as needed  Activities of Daily Living Home Assistive Devices/Equipment: Environmental consultant  (specify type) ADL Screening (condition at time of admission) Patient's cognitive ability adequate to safely complete daily activities?: Yes Is the patient deaf or have difficulty hearing?: No Does the patient have difficulty seeing, even when wearing glasses/contacts?: No Does the patient have difficulty concentrating, remembering, or making decisions?: No Patient able to express need for assistance with ADLs?: Yes Does the patient have difficulty dressing or bathing?: No Independently performs ADLs?: Yes (appropriate for developmental age) Communication: Independent Dressing (OT): Needs assistance Is this a change from baseline?: Change from baseline, expected to last <3days Grooming: Needs assistance Is this a change from baseline?: Change from baseline, expected to last <3 days Feeding: Independent Bathing: Needs assistance Is this a change from baseline?: Change from baseline, expected to last <3 days Toileting: Needs assistance Is this a change from baseline?: Change from baseline, expected to last <3 days In/Out Bed: Needs assistance Is this a change from baseline?: Change from baseline, expected to last <3 days Walks in Home: Independent Does the patient have difficulty walking or climbing stairs?: Yes Weakness of Legs: Both Weakness of Arms/Hands: None  Permission Sought/Granted                  Emotional Assessment              Admission diagnosis:  TIA (transient ischemic attack) [G45.9] Concussion without loss of consciousness, initial encounter [S06.0X0A] Strain of neck muscle, initial encounter [S16.1XXA] Fall, initial encounter [id number]] Patient Active Problem List   Diagnosis Date Noted  . Weakness 05/05/2019  . Febrile  illness 04/27/2019  . Breathlessness   . Goals of care, counseling/discussion   . Palliative care by specialist   . Encounter for hospice care discussion   . COPD with acute exacerbation ([place]) 04/06/2019  . COPD exacerbation  ([place]) 02/28/2019  . Community acquired pneumonia of left lower lobe of lung ([place])   . Hypoxia 12/26/2018  . Acute on chronic respiratory failure with hypoxia ([place])   . Acute on chronic diastolic CHF (congestive heart failure) ([place]) 10/02/2018  . Acute respiratory failure ([place]) 10/02/2018  . Acute [place] ulcer   . Acute GI bleeding 02/08/2018  . Anemia due to chronic kidney disease   . AKI (acute kidney injury) ([place]) 05/08/2017  . Diabetes mellitus with complication ([place]) 94/76/5465  . Melena   . Benign neoplasm of sigmoid colon   . Benign neoplasm of descending colon   . AVM (arteriovenous malformation) of colon with hemorrhage   . GI bleed 04/05/2017  . Intermittent left lower quadrant abdominal pain   . Generalized weakness 11/09/2016  . Symptomatic anemia 10/21/2016  . Low back pain 07/02/2015  . Gastric AVM   . AVM (arteriovenous malformation) of duodenum, acquired with hemorrhage   . Bipolar 1 disorder, mixed, moderate ([place]) 04/16/2015  . Major depressive disorder, recurrent, severe without psychotic features ([place])   . Supplemental oxygen dependent 11/30/2014  . Iron deficiency anemia due to chronic blood loss 11/25/2014  . Vitamin D deficiency 08/10/2014  . Insomnia 08/10/2014  . Polypharmacy 08/10/2014  . Chronic pain syndrome 08/10/2014  . 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 Abdominal pain 12/04/2013  . Aftercare following surgery of the circulatory system, [place] 11/04/2013  . Acute blood loss anemia 10/16/2013  . Multiple gastric polyps 09/10/2013  . Anxiety state 09/10/2013  . 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 Chronic hypoxemic respiratory failure ([place]) 05/21/2013  . Acute on chronic respiratory failure with hypoxia and hypercapnia ([place]) 12/04/2012  . Chronic GI bleeding 05/17/2012  . CAD (coronary artery disease) 04/17/2012  . Chronic diastolic CHF (congestive heart failure) ([place])  03/27/2012  . COPD (chronic obstructive pulmonary disease) ([place]) 09/13/2011  . CERUMEN IMPACTION, BILATERAL 11/11/2010  . Hyperlipidemia 08/18/2009  . Essential hypertension 08/18/2009  . ALLERGIC RHINITIS 08/18/2009  . AAA (abdominal aortic aneurysm) ([place]) 12/15/2008   PCP:  [name], MD Pharmacy:   [id number] [place], [address] [place] [address] Phone: [phone] Fax: [facility] [place], [place] [address] Phone: [phone] Fax: [phone]     Social Determinants of Health (SDOH) Interventions    Readmission Risk Interventions Readmission Risk Prevention Plan 04/13/2019  Transportation Screening Complete  Medication Review ([place]) Complete  PCP or Specialist appointment within 3-5 days of discharge Complete  HRI or [place] Complete  SW Recovery Care/Counseling Consult Complete  Palliative Care Screening Patient [place] Patient Refused  Some recent data might be hidden

## 2019-05-06 NOTE — Discharge Summary (Signed)
Physician Discharge Summary  [name] DZH:[id number] DOB: 14-Dec-1940 DOA: 05/05/2019  PCP: [name], MD  Admit date: 05/05/2019 Discharge date: 05/06/2019  Admitted From: Home Disposition:  Home  Discharge Condition:Stable CODE STATUS:FULL Diet recommendation: Heart Healthy   Brief/Interim Summary:  HPI: [name] is a 78 y.o. 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  Patient tells me that try to get up out of bed and walk to the bathroom last night but felt extremely weak upon trying to stand and fell to the ground.  He does not think that he lost consciousness however he is not sure.  He also reports over the last several days shortness of breath (which is chronic for him) denies any chest pain, does report a fever of 101 as well as a sore throat for couple of days.  He denies any focal weakness in his arms or legs however he feels generally weak.  When EMS arrived they noticed patient to have a facial droop and he was brought to the hospital.  He denies any chest pains or palpitations.  He denies any abdominal pain, nausea vomiting or diarrhea.  ED Course: In the emergency room he is afebrile 97.5, HR around 100, he is satting mid 90s on chronic home oxygen.  Blood work is at his baseline without any significant findings.  Chest x-ray with stable cardiomegaly but no significant infiltrates.  CT scan of the head and C-spine without acute findings.  His facial droop seems to have resolved by the time I evaluated patient.  We are asked to admit for TIA/syncope evaluation  Hospital Course:  Patient hospital course remained stable.  He remained hemodynamically stable.  This morning he was completely alert and oriented, comfortable.   We did full work-up for evaluation of syncope.His MRI did not show any stroke.  EKG showed normal sinus rhythm.  Echocardiogram showed left ventricular ejection fraction of 55 to 60%, impaired relaxation.  Carotid Dopplers did not show any significant carotid stenosis.  His orthostatic vitals were negative.  Patient denies any dizziness or lightheadedness this morning.  Patient seen by physical therapy and recommended home health on discharge.  We do not see any reason for continuing hospital care for this patient.  He is hemodynamically stable for discharge.  I tried to call the wife but she does not receive  the phone,no option for leaving voice message.  Patient is angry and verbally aggressive towards the staff.  Discussed with patient multiple times about the current management plan.  Following problems were addressed during his hospitalization:  Weakness/fall -Given facial droop noted by EMS and on arrival in the ED concern for stroke/TIA.  Also cannot fully rule out syncope as patient does not recall whether he lost consciousness or not -Did full work-up mentioned above -Seen by physical therapy/Occupational Therapy and recommended home health physical therapy on discharge.  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 2)Take your home medicines as instructed. 3)Follow up with home health.   Increase activity slowly   Complete by: As directed      Allergies as of 05/06/2019      Reactions   Morphine And Related Shortness Of Breath, Nausea And Vomiting, Rash, Other (See Comments)   Reaction:  Hallucinations    Penicillins Anaphylaxis, Hives, Other (See Comments)   10/02/18 - discussed with patient and he states he can tolerate penicillin capsules and had some hives when he was 78yo and received pcn injections Has patient had a PCN reaction causing immediate rash, facial/tongue/throat swelling, SOB or lightheadedness with hypotension: Yes Has patient had a PCN reaction causing severe rash involving mucus membranes or skin necrosis: No Has patient had a PCN reaction that required hospitalization No Has patient had a PCN reaction occurring within the last 10 y   Zolpidem Shortness Of Breath   Demerol [meperidine] Other (See Comments)   Reaction:  Hallucinations     Dilaudid [hydromorphone Hcl] Other (See Comments)   Reaction:  Hallucinations    Levofloxacin Other (See Comments)   Reaction:  Unknown       Medication List    TAKE these medications   acetaminophen 500 MG tablet Commonly known as: TYLENOL Take 1,000 mg by mouth every 6 (six) hours as needed for fever or headache (pain).   albuterol 108 (90 Base) MCG/ACT inhaler Commonly known as: VENTOLIN HFA Inhale 2 puffs into the lungs every 6 (six) hours as needed for wheezing or shortness of breath.   albuterol (2.5 MG/3ML) 0.083% nebulizer solution Commonly known as: PROVENTIL Inhale 3 mLs (2.5 mg total) into the lungs every 2 (two) hours as needed for wheezing or shortness of breath.   ALPRAZolam 0.25 MG tablet Commonly known as: XANAX Take 1 tablet (0.25 mg total) by mouth 3 (three) times daily as needed for anxiety or sleep.   atorvastatin 10 MG tablet Commonly known as:  LIPITOR Take 1 tablet (10 mg total) by mouth daily at 6 PM.   citalopram 40 MG tablet Commonly known as: CELEXA Take 0.5 tablets (20 mg total) by mouth daily. What changed: how much to take   [employer] Melatonin 5 MG Tabs Generic drug: Melatonin Take 5 mg by mouth at bedtime. For sleep   docusate sodium 100 MG capsule Commonly known as: COLACE Take 1 capsule (100 mg total) by mouth 2 (two) times daily.   Dulera 200-5 MCG/ACT Aero Generic drug: mometasone-formoterol Inhale 2 puffs into the lungs 2 (two) times daily as needed for wheezing or shortness of breath.   ferrous sulfate 325 (65 FE) MG tablet Take 1 tablet (325 mg total) by mouth 3 (three) times daily with meals.   fluticasone 50 MCG/ACT nasal spray Commonly known as: FLONASE Place 2 sprays into both nostrils daily. What changed:   when to take this  reasons to take this   furosemide 20 MG tablet Commonly known as: LASIX Take 3 tablets (60 mg total) by mouth daily. What changed:   how much to take  when to take this  reasons to take this   gabapentin 300 MG  capsule Commonly known as: NEURONTIN Take 2 capsules (600 mg total) by mouth 3 (three) times daily. What changed: when to take this   insulin aspart 100 UNIT/ML injection Commonly known as: novoLOG Inject 25 Units into the skin 3 (three) times daily with meals.   insulin glargine 100 UNIT/ML injection Commonly known as: LANTUS Inject 0.35 mLs (35 Units total) into the skin 2 (two) times daily. What changed: how much to take   ipratropium-albuterol 0.5-2.5 (3) MG/3ML Soln Commonly known as: DUONEB He is used 3 times daily for next 5 days, then 3 times daily as needed   lisinopril 2.5 MG tablet Commonly known as: ZESTRIL Take 1 tablet (2.5 mg total) by mouth daily.   loratadine 10 MG tablet Commonly known as: CLARITIN Take 1 tablet (10 mg total) by mouth daily.   metoprolol tartrate 25 MG tablet Commonly known as: LOPRESSOR Take 0.5 tablets  (12.5 mg total) by mouth 2 (two) times daily. What changed:   how much to take  when to take this   pantoprazole 40 MG tablet Commonly known as: PROTONIX Take 1 tablet (40 mg total) by mouth 2 (two) times daily.      Follow-up Information    [name], MD. Schedule an appointment as soon as possible for a visit in 1 week(s).   Specialty: Internal Medicine Contact information: [place] [id number] [phone]          Allergies  Allergen Reactions  . Morphine And Related Shortness Of Breath, Nausea And Vomiting, Rash and Other (See Comments)    Reaction:  Hallucinations   . Penicillins Anaphylaxis, Hives and Other (See Comments)    10/02/18 - discussed with patient and he states he can tolerate penicillin capsules and had some hives when he was 78yo and received pcn injections  Has patient had a PCN reaction causing immediate rash, facial/tongue/throat swelling, SOB or lightheadedness with hypotension: Yes Has patient had a PCN reaction causing severe rash involving mucus membranes or skin necrosis: No Has patient had a PCN reaction that required hospitalization No Has patient had a PCN reaction occurring within the last 10 y  . Zolpidem Shortness Of Breath  . Demerol [Meperidine] Other (See Comments)    Reaction:  Hallucinations    . Dilaudid [Hydromorphone Hcl] Other (See Comments)    Reaction:  Hallucinations   . Levofloxacin Other (See Comments)    Reaction:  Unknown     Consultations:  None   Procedures/Studies: X-ray Chest Pa And Lateral  Result Date: 04/28/2019 CLINICAL DATA:  Fever.  Chest congestion. EXAM: CHEST - 2 VIEW COMPARISON:  04/27/2019 and earlier exams. FINDINGS: Cardiac silhouette is normal in size. 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 Stable hiatal hernia. No mediastinal or hilar masses. Lungs are hyperexpanded. Left base atelectasis. No evidence of pneumonia or pulmonary edema. No convincing pleural effusion and no  pneumothorax. Skeletal structures are demineralized but grossly intact. IMPRESSION: 1. No acute cardiopulmonary disease. 2. Left lung base opacity is stable consistent with atelectasis. Electronically Signed   By: Lajean Manes M.D.   On: 04/28/2019 06:49   Dg Thoracic Spine 2 View  Result Date: 04/27/2019 CLINICAL DATA:  Pain after fall EXAM: THORACIC SPINE 2 VIEWS COMPARISON:  None. FINDINGS: There is no evidence of thoracic spine fracture. Alignment is normal. No other significant bone abnormalities are identified. IMPRESSION: Negative. Electronically Signed   By: Dorise Bullion III M.D   On: 04/27/2019 17:36   Dg Lumbar Spine Complete  Result Date: 04/27/2019 CLINICAL DATA:  Pain after fall EXAM: LUMBAR SPINE - COMPLETE 4+ VIEW COMPARISON:  CT of the chest March 30, 2019 FINDINGS: Mild anterior wedging of T12 is stable. No acute fracture or traumatic malalignment. Multilevel degenerative changes. Lower lumbar facet degenerative changes. IMPRESSION: Degenerative changes as above. No acute fracture or traumatic malalignment. Mild wedging of T12 is nonacute. Electronically Signed   By: Dorise Bullion III M.D   On: 04/27/2019 17:35   Dg Abd 1 View  Result Date: 04/28/2019 CLINICAL DATA:  Initial evaluation for acute fever, constipation. EXAM: ABDOMEN - 1 VIEW COMPARISON:  Prior CT from 06/28/2017 FINDINGS: Visualized bowel gas pattern within normal limits without evidence for obstruction or ileus. No appreciable abnormal bowel wall thickening. Overall stool burden is moderate in nature. No soft tissue mass or abnormal calcification. Aorto bi-iliac stent endograft in place. Suture material overlies the left pelvis. No acute osseous finding. IMPRESSION: 1. Nonobstructive bowel gas pattern with no radiographic evidence for acute intra-abdominal process. 2. Moderate stool burden. Electronically Signed   By: Jeannine Boga M.D.   On: 04/28/2019 06:25   Ct Head Wo Contrast  Result Date:  05/05/2019 CLINICAL DATA:  Fall this morning, LEFT-sided facial droop onset yesterday. EXAM: CT HEAD WITHOUT CONTRAST CT CERVICAL SPINE WITHOUT CONTRAST TECHNIQUE: Multidetector CT imaging of the head and cervical spine was performed following the standard protocol without intravenous contrast. Multiplanar CT image reconstructions of the cervical spine were also generated. COMPARISON:  Head CT dated 05/19/2015. FINDINGS: CT HEAD FINDINGS Brain: Generalized age related parenchymal volume loss with commensurate dilatation of the ventricles and sulci. No mass, hemorrhage, edema or other evidence of acute parenchymal abnormality. No extra-axial hemorrhage. Vascular: Chronic calcified atherosclerotic changes of the large vessels at the skull base. No unexpected hyperdense vessel. Skull: Normal. Negative for fracture or focal lesion. Sinuses/Orbits: No acute finding. Other: None. CT CERVICAL SPINE FINDINGS Alignment: Mild scoliosis. Slight reversal of the normal cervical spine lordosis. No evidence of acute vertebral body subluxation. Skull base and vertebrae: No fracture line or displaced fracture fragment seen. Facets are normally aligned throughout. Soft tissues and spinal canal: No prevertebral fluid or swelling. No visible canal hematoma. Disc levels: Degenerative spondylosis throughout the mid and lower cervical spine, mild to moderate in degree. Associated mild to moderate central canal stenoses at the C4-5 through C6-7 levels. Upper chest: Prominent emphysematous bleb at the LEFT lung apex. Chronic scarring/fibrosis at the lung apices. No acute findings. Other: Bilateral carotid atherosclerosis. IMPRESSION: 1. No acute intracranial abnormality. No intracranial mass, hemorrhage or edema. No skull fracture. 2. No fracture or acute subluxation within the cervical spine. Degenerative changes within the mid and lower cervical spine, as detailed above. 3. Carotid atherosclerosis. Electronically Signed   By: Franki Cabot M.D.   On: 05/05/2019 06:19   Ct Cervical Spine Wo Contrast  Result Date: 05/05/2019 CLINICAL DATA:  Fall this morning, LEFT-sided facial droop onset yesterday. EXAM: CT HEAD WITHOUT CONTRAST CT CERVICAL SPINE WITHOUT CONTRAST TECHNIQUE: Multidetector CT imaging of the head and cervical spine was performed following the standard protocol without intravenous contrast. Multiplanar CT image reconstructions of the cervical spine were also generated. COMPARISON:  Head CT dated 05/19/2015. FINDINGS: CT HEAD FINDINGS Brain: Generalized age related parenchymal volume loss with commensurate dilatation of the ventricles and sulci. No mass, hemorrhage, edema or other evidence of acute parenchymal abnormality. No extra-axial hemorrhage. Vascular: Chronic calcified atherosclerotic changes of the large vessels at the skull base. No unexpected hyperdense  vessel. Skull: Normal. Negative for fracture or focal lesion. Sinuses/Orbits: No acute finding. Other: None. CT CERVICAL SPINE FINDINGS Alignment: Mild scoliosis. Slight reversal of the normal cervical spine lordosis. No evidence of acute vertebral body subluxation. Skull base and vertebrae: No fracture line or displaced fracture fragment seen. Facets are normally aligned throughout. Soft tissues and spinal canal: No prevertebral fluid or swelling. No visible canal hematoma. Disc levels: Degenerative spondylosis throughout the mid and lower cervical spine, mild to moderate in degree. Associated mild to moderate central canal stenoses at the C4-5 through C6-7 levels. Upper chest: Prominent emphysematous bleb at the LEFT lung apex. Chronic scarring/fibrosis at the lung apices. No acute findings. Other: Bilateral carotid atherosclerosis. IMPRESSION: 1. No acute intracranial abnormality. No intracranial mass, hemorrhage or edema. No skull fracture. 2. No fracture or acute subluxation within the cervical spine. Degenerative changes within the mid and lower cervical spine,  as detailed above. 3. Carotid atherosclerosis. Electronically Signed   By: Franki Cabot M.D.   On: 05/05/2019 06:19   Mr Brain Wo Contrast  Result Date: 05/05/2019 CLINICAL DATA:  Fall.  Generalized weakness. EXAM: MRI HEAD WITHOUT CONTRAST TECHNIQUE: Multiplanar, multiecho pulse sequences of the brain and surrounding structures were obtained without intravenous contrast. COMPARISON:  Head CT 05/05/2019 FINDINGS: The patient terminated the examination prior to completion. An axial T1 sequence was not obtained, and multiple other sequences are moderately motion degraded. Brain: There is no evidence of acute infarct, intracranial hemorrhage, mass, midline shift, or extra-axial fluid collection. There is mild cerebral atrophy. No significant white matter disease is seen for age. Vascular: Major intracranial vascular flow voids are preserved. Skull and upper cervical spine: Unremarkable bone marrow signal. Sinuses/Orbits: Right cataract extraction. Mild bilateral ethmoid air cell mucosal thickening. Small left mastoid effusion. Other: None. IMPRESSION: 1. Motion degraded examination. 2. No acute intracranial abnormality. 3. Mild cerebral atrophy. Electronically Signed   By: Logan Bores M.D.   On: 05/05/2019 22:54   Dg Chest Portable 1 View  Result Date: 05/05/2019 CLINICAL DATA:  Status post fall. EXAM: PORTABLE CHEST 1 VIEW COMPARISON:  Chest x-rays dated 04/28/2019 and 04/27/2019. FINDINGS: Stable cardiomegaly. Overall cardiomediastinal silhouette is stable. Median sternotomy wires appear intact. Probable atelectasis at the LEFT lung base. Lungs otherwise clear. No pneumothorax seen. No osseous fracture or dislocation seen. IMPRESSION: 1. Probable atelectasis at the LEFT lung base. 2. Stable cardiomegaly. Electronically Signed   By: Franki Cabot M.D.   On: 05/05/2019 05:51   Dg Chest Port 1 View  Result Date: 04/27/2019 CLINICAL DATA:  Status post fall. EXAM: PORTABLE CHEST 1 VIEW COMPARISON:  April 06, 2019 FINDINGS: Postsurgical changes from CABG. Cardiomediastinal silhouette is normal. Mediastinal contours appear intact. Known hiatal hernia. There is no evidence of focal airspace consolidation, pleural effusion or pneumothorax. Left lower lobe atelectasis. Osseous structures are without acute abnormality. Soft tissues are grossly normal. IMPRESSION: Left lower lobe atelectasis. Electronically Signed   By: Fidela Salisbury M.D.   On: 04/27/2019 16:06   Vas US Carotid (at [facility] Only)  Result Date: 05/06/2019 Carotid Arterial Duplex Study Indications:       TIA. Risk Factors:      Hyperlipidemia, Diabetes. Limitations:       thick neck, depth of vessels Comparison Study:  no prior Performing Technologist: June Leap RDMS, RVT  Examination Guidelines: A complete evaluation includes B-mode imaging, spectral Doppler, color Doppler, and power Doppler as needed of all accessible portions of each vessel. Bilateral testing is considered  an integral part of a complete examination. Limited examinations for reoccurring indications may be performed as noted.  Right Carotid Findings: +----------+--------+--------+--------+--------+--------------+           PSV cm/sEDV cm/sStenosisDescribeComments       +----------+--------+--------+--------+--------+--------------+ CCA Prox  70      13                                     +----------+--------+--------+--------+--------+--------------+ CCA Distal52      12                                     +----------+--------+--------+--------+--------+--------------+ ICA Prox  50      14                                     +----------+--------+--------+--------+--------+--------------+ ICA Distal                                Not visualized +----------+--------+--------+--------+--------+--------------+ ECA       61      12                                     +----------+--------+--------+--------+--------+--------------+  +----------+--------+-------+--------------+-------------------+           PSV cm/sEDV cmsDescribe      Arm Pressure (mmHG) +----------+--------+-------+--------------+-------------------+ Subclavian               Not identified                    +----------+--------+-------+--------------+-------------------+ +---------+--------+--+--------+--+ VertebralPSV cm/s74EDV cm/s15 +---------+--------+--+--------+--+  Left Carotid Findings: +----------+--------+--------+--------+--------+--------------+           PSV cm/sEDV cm/sStenosisDescribeComments       +----------+--------+--------+--------+--------+--------------+ CCA Prox  86      14                                     +----------+--------+--------+--------+--------+--------------+ CCA Distal72      14                                     +----------+--------+--------+--------+--------+--------------+ ICA Prox  74      17                                     +----------+--------+--------+--------+--------+--------------+ ICA Distal                                Not visualized +----------+--------+--------+--------+--------+--------------+ ECA       174     17                                     +----------+--------+--------+--------+--------+--------------+ +----------+--------+--------+--------------+-------------------+ SubclavianPSV cm/sEDV cm/sDescribe      Arm Pressure (mmHG) +----------+--------+--------+--------------+-------------------+  Not identified                    +----------+--------+--------+--------------+-------------------+ +---------+--------+--------+--------------+ VertebralPSV cm/sEDV cm/sNot identified +---------+--------+--------+--------------+  Summary: Right Carotid: Technically limited due to body habitus. No significant ICA                stenosis visualized. Left Carotid: Technically limited due to body habitus. No significant ICA                stenosis visualized.  *See table(s) above for measurements and observations.     Preliminary        Subjective:  Patient seen and examined the bedside this morning.  Hemodynamically stable for discharge today.  Discharge Exam: Vitals:   05/06/19 0853 05/06/19 1203  BP:  125/77  Pulse:  85  Resp:  18  Temp:    SpO2: 98% 98%   Vitals:   05/06/19 0353 05/06/19 0817 05/06/19 0853 05/06/19 1203  BP: 109/60 113/63  125/77  Pulse: 80 79  85  Resp: 18 16  18   Temp: 97.8 F (36.6 C) 98.6 F (37 C)    TempSrc: Oral Oral    SpO2: 94% 99% 98% 98%  Weight:      Height:        General: Pt is alert, awake, not in acute distress Cardiovascular: RRR, S1/S2 +, no rubs, no gallops Respiratory: CTA bilaterally, no wheezing, no rhonchi Abdominal: Soft, NT, ND, bowel sounds + Extremities: no edema, no cyanosis    The results of significant diagnostics from this hospitalization (including imaging, microbiology, ancillary and laboratory) are listed below for reference.     Microbiology: Recent Results (from the past 240 hour(s))  SARS Coronavirus 2 (CEPHEID- Performed in [place] hospital lab), Hosp Order     Status: None   Collection Time: 04/27/19  2:48 PM   Specimen: Nasopharyngeal Swab  Result Value Ref Range Status   SARS Coronavirus 2 NEGATIVE NEGATIVE Final    Comment: (NOTE) If result is NEGATIVE SARS-CoV-2 target nucleic acids are NOT DETECTED. The SARS-CoV-2 RNA is generally detectable in upper and lower  respiratory specimens during the acute phase of infection. The lowest  concentration of SARS-CoV-2 viral copies this assay can detect is 250  copies / mL. A negative result does not preclude SARS-CoV-2 infection  and should not be used as the sole basis for treatment or other  patient management decisions.  A negative result may occur with  improper specimen collection / handling, submission of specimen other  than nasopharyngeal swab, presence of viral  mutation(s) within the  areas targeted by this assay, and inadequate number of viral copies  (<250 copies / mL). A negative result must be combined with clinical  observations, patient history, and epidemiological information. If result is POSITIVE SARS-CoV-2 target nucleic acids are DETECTED. The SARS-CoV-2 RNA is generally detectable in upper and lower  respiratory specimens dur ing the acute phase of infection.  Positive  results are indicative of active infection with SARS-CoV-2.  Clinical  correlation with patient history and other diagnostic information is  necessary to determine patient infection status.  Positive results do  not rule out bacterial infection or co-infection with other viruses. If result is PRESUMPTIVE POSTIVE SARS-CoV-2 nucleic acids MAY BE PRESENT.   A presumptive positive result was obtained on the submitted specimen  and confirmed on repeat testing.  While 2019 novel coronavirus  (SARS-CoV-2) nucleic acids may be present in the submitted sample  additional confirmatory  testing may be necessary for epidemiological  and / or clinical management purposes  to differentiate between  SARS-CoV-2 and other Sarbecovirus currently known to infect humans.  If clinically indicated additional testing with an alternate test  methodology [phone]) is advised. The SARS-CoV-2 RNA is generally  detectable in upper and lower respiratory sp ecimens during the acute  phase of infection. The expected result is Negative. Fact Sheet for Patients:  StrictlyIdeas.no Fact Sheet for Healthcare Providers: BankingDealers.co.za This test is not yet approved or cleared by the [place] FDA and has been authorized for detection and/or diagnosis of SARS-CoV-2 by FDA under an Emergency Use Authorization (EUA).  This EUA will remain in effect (meaning this test can be used) for the duration of the COVID-19 declaration under Section 564(b)(1)  of the Act, 21 U.S.C. section 360bbb-3(b)(1), unless the authorization is terminated or revoked sooner. Performed at [facility], [place] [address], [place], [place] [address]   Urine culture     Status: Abnormal   Collection Time: 04/27/19  3:22 PM   Specimen: Urine, Random  Result Value Ref Range Status   Specimen Description URINE, RANDOM  Final   Special Requests   Final    NONE Performed at [facility], [place] [address], [place], [place] [address]    Culture (A)  Final    10,000 COLONIES/mL MULTIPLE SPECIES PRESENT, SUGGEST RECOLLECTION   Report Status 04/28/2019 FINAL  Final  Blood culture (routine x 2)     Status: None   Collection Time: 04/27/19  3:50 PM   Specimen: BLOOD RIGHT ARM  Result Value Ref Range Status   Specimen Description BLOOD RIGHT ARM  Final   Special Requests   Final    BOTTLES DRAWN AEROBIC AND ANAEROBIC Blood Culture adequate volume   Culture   Final    NO GROWTH 5 DAYS Performed at [facility], [place] [address] [place], [place] [address]    Report Status 05/02/2019 FINAL  Final  Blood culture (routine x 2)     Status: None   Collection Time: 04/27/19  3:58 PM   Specimen: BLOOD  Result Value Ref Range Status   Specimen Description BLOOD RIGHT ANTECUBITAL  Final   Special Requests   Final    BOTTLES DRAWN AEROBIC AND ANAEROBIC Blood Culture adequate volume   Culture   Final    NO GROWTH 5 DAYS Performed at [facility], [place] [address] [place], [place] [address]    Report Status 05/02/2019 FINAL  Final  SARS Coronavirus 2 (CEPHEID - Performed in [place] hospital lab), Hosp Order     Status: None   Collection Time: 05/05/19  7:32 AM   Specimen: Urine, Clean Catch; Nasopharyngeal  Result Value Ref Range Status   SARS Coronavirus 2 NEGATIVE NEGATIVE Final    Comment: (NOTE) If result is NEGATIVE SARS-CoV-2 target nucleic acids are NOT DETECTED. The SARS-CoV-2 RNA is generally detectable in upper and lower  respiratory  specimens during the acute phase of infection. The lowest  concentration of SARS-CoV-2 viral copies this assay can detect is 250  copies / mL. A negative result does not preclude SARS-CoV-2 infection  and should not be used as the sole basis for treatment or other  patient management decisions.  A negative result may occur with  improper specimen collection / handling, submission of specimen other  than nasopharyngeal swab, presence of viral mutation(s) within the  areas targeted by this assay, and  inadequate number of viral copies  (<250 copies / mL). A negative result must be combined with clinical  observations, patient history, and epidemiological information. If result is POSITIVE SARS-CoV-2 target nucleic acids are DETECTED. The SARS-CoV-2 RNA is generally detectable in upper and lower  respiratory specimens dur ing the acute phase of infection.  Positive  results are indicative of active infection with SARS-CoV-2.  Clinical  correlation with patient history and other diagnostic information is  necessary to determine patient infection status.  Positive results do  not rule out bacterial infection or co-infection with other viruses. If result is PRESUMPTIVE POSTIVE SARS-CoV-2 nucleic acids MAY BE PRESENT.   A presumptive positive result was obtained on the submitted specimen  and confirmed on repeat testing.  While 2019 novel coronavirus  (SARS-CoV-2) nucleic acids may be present in the submitted sample  additional confirmatory testing may be necessary for epidemiological  and / or clinical management purposes  to differentiate between  SARS-CoV-2 and other Sarbecovirus currently known to infect humans.  If clinically indicated additional testing with an alternate test  methodology [phone]) is advised. The SARS-CoV-2 RNA is generally  detectable in upper and lower respiratory sp ecimens during the acute  phase of infection. The expected result is Negative. Fact Sheet for  Patients:  StrictlyIdeas.no Fact Sheet for Healthcare Providers: BankingDealers.co.za This test is not yet approved or cleared by the [place] FDA and has been authorized for detection and/or diagnosis of SARS-CoV-2 by FDA under an Emergency Use Authorization (EUA).  This EUA will remain in effect (meaning this test can be used) for the duration of the COVID-19 declaration under Section 564(b)(1) of the Act, 21 U.S.C. section 360bbb-3(b)(1), unless the authorization is terminated or revoked sooner. Performed at [facility], [place] [address], [place], [place] [address]      Labs: BNP (last 3 results) Recent Labs    12/26/18 0608 02/28/19 0512 03/30/19 0933  BNP 236.6* 112.0* 938.1*   Basic Metabolic Panel: Recent Labs  Lab 05/05/19 0524  NA 133*  K 4.3  CL 88*  CO2 33*  GLUCOSE 174*  BUN 11  CREATININE 1.17  CALCIUM 9.3   Liver Function Tests: No results for input(s): AST, ALT, ALKPHOS, BILITOT, PROT, ALBUMIN in the last 168 hours. No results for input(s): LIPASE, AMYLASE in the last 168 hours. No results for input(s): AMMONIA in the last 168 hours. CBC: Recent Labs  Lab 05/05/19 0524  WBC 10.0  NEUTROABS 8.0*  HGB 10.3*  HCT 33.7*  MCV 92.3  PLT 230   Cardiac Enzymes: No results for input(s): CKTOTAL, CKMB, CKMBINDEX, TROPONINI in the last 168 hours. BNP: Invalid input(s): POCBNP CBG: Recent Labs  Lab 05/05/19 1701 05/05/19 2343 05/06/19 0352 05/06/19 0619 05/06/19 1202  GLUCAP 106* 116* 112* 119* 143*   D-Dimer No results for input(s): DDIMER in the last 72 hours. Hgb A1c Recent Labs    05/06/19 0543  HGBA1C 6.4*   Lipid Profile Recent Labs    05/06/19 0543  CHOL 177  HDL 31*  LDLCALC 100*  TRIG 228*  CHOLHDL 5.7   Thyroid function studies No results for input(s): TSH, T4TOTAL, T3FREE, THYROIDAB in the last 72 hours.  Invalid input(s): FREET3 Anemia work up No results for  input(s): VITAMINB12, FOLATE, FERRITIN, TIBC, IRON, RETICCTPCT in the last 72 hours. Urinalysis    Component Value Date/Time   COLORURINE YELLOW 05/05/2019 0732   APPEARANCEUR CLEAR 05/05/2019 0732   LABSPEC 1.014 05/05/2019 0732  PHURINE 7.0 05/05/2019 0732   GLUCOSEU NEGATIVE 05/05/2019 0732   HGBUR NEGATIVE 05/05/2019 0732   BILIRUBINUR NEGATIVE 05/05/2019 0732   BILIRUBINUR small 02/13/2015 1354   KETONESUR 5 (A) 05/05/2019 0732   PROTEINUR 100 (A) 05/05/2019 0732   UROBILINOGEN 0.2 02/13/2015 1354   UROBILINOGEN 0.2 08/24/2014 1948   NITRITE NEGATIVE 05/05/2019 0732   LEUKOCYTESUR NEGATIVE 05/05/2019 0732   Sepsis Labs Invalid input(s): PROCALCITONIN,  WBC,  LACTICIDVEN Microbiology Recent Results (from the past 240 hour(s))  SARS Coronavirus 2 (CEPHEID- Performed in [place] hospital lab), Hosp Order     Status: None   Collection Time: 04/27/19  2:48 PM   Specimen: Nasopharyngeal Swab  Result Value Ref Range Status   SARS Coronavirus 2 NEGATIVE NEGATIVE Final    Comment: (NOTE) If result is NEGATIVE SARS-CoV-2 target nucleic acids are NOT DETECTED. The SARS-CoV-2 RNA is generally detectable in upper and lower  respiratory specimens during the acute phase of infection. The lowest  concentration of SARS-CoV-2 viral copies this assay can detect is 250  copies / mL. A negative result does not preclude SARS-CoV-2 infection  and should not be used as the sole basis for treatment or other  patient management decisions.  A negative result may occur with  improper specimen collection / handling, submission of specimen other  than nasopharyngeal swab, presence of viral mutation(s) within the  areas targeted by this assay, and inadequate number of viral copies  (<250 copies / mL). A negative result must be combined with clinical  observations, patient history, and epidemiological information. If result is POSITIVE SARS-CoV-2 target nucleic acids are DETECTED. The SARS-CoV-2  RNA is generally detectable in upper and lower  respiratory specimens dur ing the acute phase of infection.  Positive  results are indicative of active infection with SARS-CoV-2.  Clinical  correlation with patient history and other diagnostic information is  necessary to determine patient infection status.  Positive results do  not rule out bacterial infection or co-infection with other viruses. If result is PRESUMPTIVE POSTIVE SARS-CoV-2 nucleic acids MAY BE PRESENT.   A presumptive positive result was obtained on the submitted specimen  and confirmed on repeat testing.  While 2019 novel coronavirus  (SARS-CoV-2) nucleic acids may be present in the submitted sample  additional confirmatory testing may be necessary for epidemiological  and / or clinical management purposes  to differentiate between  SARS-CoV-2 and other Sarbecovirus currently known to infect humans.  If clinically indicated additional testing with an alternate test  methodology [phone]) is advised. The SARS-CoV-2 RNA is generally  detectable in upper and lower respiratory sp ecimens during the acute  phase of infection. The expected result is Negative. Fact Sheet for Patients:  StrictlyIdeas.no Fact Sheet for Healthcare Providers: BankingDealers.co.za This test is not yet approved or cleared by the [place] FDA and has been authorized for detection and/or diagnosis of SARS-CoV-2 by FDA under an Emergency Use Authorization (EUA).  This EUA will remain in effect (meaning this test can be used) for the duration of the COVID-19 declaration under Section 564(b)(1) of the Act, 21 U.S.C. section 360bbb-3(b)(1), unless the authorization is terminated or revoked sooner. Performed at [facility], [place] [address], [place], [place] [address]   Urine culture     Status: Abnormal   Collection Time: 04/27/19  3:22 PM   Specimen: Urine, Random  Result Value Ref Range  Status   Specimen Description URINE, RANDOM  Final   Special Requests   Final  NONE Performed at [facility], [place] [address] [place], [place] [address]    Culture (A)  Final    10,000 COLONIES/mL MULTIPLE SPECIES PRESENT, SUGGEST RECOLLECTION   Report Status 04/28/2019 FINAL  Final  Blood culture (routine x 2)     Status: None   Collection Time: 04/27/19  3:50 PM   Specimen: BLOOD RIGHT ARM  Result Value Ref Range Status   Specimen Description BLOOD RIGHT ARM  Final   Special Requests   Final    BOTTLES DRAWN AEROBIC AND ANAEROBIC Blood Culture adequate volume   Culture   Final    NO GROWTH 5 DAYS Performed at [facility], [place] [address] [place], [place] [address]    Report Status 05/02/2019 FINAL  Final  Blood culture (routine x 2)     Status: None   Collection Time: 04/27/19  3:58 PM   Specimen: BLOOD  Result Value Ref Range Status   Specimen Description BLOOD RIGHT ANTECUBITAL  Final   Special Requests   Final    BOTTLES DRAWN AEROBIC AND ANAEROBIC Blood Culture adequate volume   Culture   Final    NO GROWTH 5 DAYS Performed at [facility], [place] [address], [place], [place] [address]    Report Status 05/02/2019 FINAL  Final  SARS Coronavirus 2 (CEPHEID - Performed in [place] hospital lab), Hosp Order     Status: None   Collection Time: 05/05/19  7:32 AM   Specimen: Urine, Clean Catch; Nasopharyngeal  Result Value Ref Range Status   SARS Coronavirus 2 NEGATIVE NEGATIVE Final    Comment: (NOTE) If result is NEGATIVE SARS-CoV-2 target nucleic acids are NOT DETECTED. The SARS-CoV-2 RNA is generally detectable in upper and lower  respiratory specimens during the acute phase of infection. The lowest  concentration of SARS-CoV-2 viral copies this assay can detect is 250  copies / mL. A negative result does not preclude SARS-CoV-2 infection  and should not be used as the sole basis for treatment or other  patient management decisions.  A negative  result may occur with  improper specimen collection / handling, submission of specimen other  than nasopharyngeal swab, presence of viral mutation(s) within the  areas targeted by this assay, and inadequate number of viral copies  (<250 copies / mL). A negative result must be combined with clinical  observations, patient history, and epidemiological information. If result is POSITIVE SARS-CoV-2 target nucleic acids are DETECTED. The SARS-CoV-2 RNA is generally detectable in upper and lower  respiratory specimens dur ing the acute phase of infection.  Positive  results are indicative of active infection with SARS-CoV-2.  Clinical  correlation with patient history and other diagnostic information is  necessary to determine patient infection status.  Positive results do  not rule out bacterial infection or co-infection with other viruses. If result is PRESUMPTIVE POSTIVE SARS-CoV-2 nucleic acids MAY BE PRESENT.   A presumptive positive result was obtained on the submitted specimen  and confirmed on repeat testing.  While 2019 novel coronavirus  (SARS-CoV-2) nucleic acids may be present in the submitted sample  additional confirmatory testing may be necessary for epidemiological  and / or clinical management purposes  to differentiate between  SARS-CoV-2 and other Sarbecovirus currently known to infect humans.  If clinically indicated additional testing with an alternate test  methodology [phone]) is advised. The SARS-CoV-2 RNA is generally  detectable in upper and lower respiratory sp ecimens during the acute  phase of  infection. The expected result is Negative. Fact Sheet for Patients:  StrictlyIdeas.no Fact Sheet for Healthcare Providers: BankingDealers.co.za This test is not yet approved or cleared by the [place] FDA and has been authorized for detection and/or diagnosis of SARS-CoV-2 by FDA under an Emergency Use Authorization  (EUA).  This EUA will remain in effect (meaning this test can be used) for the duration of the COVID-19 declaration under Section 564(b)(1) of the Act, 21 U.S.C. section 360bbb-3(b)(1), unless the authorization is terminated or revoked sooner. Performed at [facility], [place] [address] [place], [place] [address]     Please note: You were cared for by a hospitalist during your hospital stay. Once you are discharged, your primary care physician will handle any further medical issues. Please note that NO REFILLS for any discharge medications will be authorized once you are discharged, as it is imperative that you return to your primary care physician (or establish a relationship with a primary care physician if you do not have one) for your post hospital discharge needs so that they can reassess your need for medications and monitor your lab values.    Time coordinating discharge: 40 minutes  SIGNED:   Shelly Coss, MD  [facility] 05/06/2019, 1:41 PM Pager [phone]  If 7PM-7AM, please contact night-coverage [email or web address] Password [email or web address]

## 2019-05-06 NOTE — Evaluation (Signed)
Occupational Therapy Evaluation Patient Details Name: [name] MRN: [id number] DOB: Feb 13, 1941 Today's Date: 05/06/2019    History of Present Illness [name] is a 78 y.o. male with medical history significant for chronic respiratory failure with hypoxia on supplemental O2, stage IV COPD, CAD status post CABG, chronic diastolic CHF, AAA status post endovascular repair, insulin-dependent type 2 diabetes, hypertension, hyperlipidemia, anemia, depression, anxiety, and bipolar disorder who presents to the ED for evaluation after a fall at home, getting up from the toilet; reports intermittent dizziness   Clinical Impression   Pt admitted with above problem list and now presenting to OT with c/o generalized weakness, confusion, and decreased functional activity tolerance. 2/2 pt's cognitive deficits, it is unclear what pt's true baseline was. Pt is able to transfer at a min guard- (S) level with RW. Pt has had frequent falls in the home and remains a high fall risk. Was unable to reach pt's wife for clarity on baseline. If she feels he is near baseline, pt safe to go home with [name]. Depending on amount of support she can provide, SNF may be more appropriate. OT will continue to follow acutely.     Follow Up Recommendations  Supervision/Assistance - 24 hour;SNF;Home health OT    Equipment Recommendations  None recommended by OT    Recommendations for Other Services PT consult;Speech consult     Precautions / Restrictions Precautions Precautions: Fall Restrictions Weight Bearing Restrictions: No      Mobility Bed Mobility Overal bed mobility: Modified Independent             General bed mobility comments: At baseline  Transfers Overall transfer level: Needs assistance Equipment used: Rolling walker (2 wheeled) Transfers: Sit to/from [facility] Sit to Stand: Supervision Stand pivot transfers: Min guard;From elevated surface       General  transfer comment: min guard for safety/balance, no physical support provided; braced backs of LEs on bed    Balance Overall balance assessment: Needs assistance Sitting-balance support: No upper extremity supported;Feet supported Sitting balance-Leahy Scale: Good     Standing balance support: During functional activity;No upper extremity supported Standing balance-Leahy Scale: Fair Standing balance comment: Pt able to withstand slight resistance to balance when standing without UE support                           ADL either performed or assessed with clinical judgement   ADL Overall ADL's : Needs assistance/impaired Eating/Feeding: Set up   Grooming: Oral care;Wash/dry face;Wash/dry hands;Set up;Sitting   Upper Body Bathing: Minimal assistance;Sitting   Lower Body Bathing: Moderate assistance;Sit to/from stand Lower Body Bathing Details (indicate cue type and reason): Pt refused to attempt and reach to feet and don socks, nor to show therapist how far he could reach. Reports that he does not dress at home Upper Body Dressing : Set up;Sitting   Lower Body Dressing: Moderate assistance;Sit to/from stand   Toilet Transfer: Copy Details (indicate cue type and reason): simulated in room         Functional mobility during ADLs: Rolling walker;Min guard General ADL Comments: limited by generalized weakness     Vision Baseline Vision/History: Cataracts Patient Visual Report: Other (comment)(reports he cataract removed in R eye a few months ago) Vision Assessment?: No apparent visual deficits     Perception Perception Spatial deficits: Overshooting, slow and very calculated when reaching   [place] tested?: Within functional limits  Pertinent Vitals/Pain Pain Assessment: 0-10 Pain Score: 5  Pain Location: L knee and hip Pain Descriptors / Indicators: Aching Pain Intervention(s): Monitored during session     Hand  Dominance Right   Extremity/Trunk Assessment Upper Extremity Assessment Upper Extremity Assessment: Generalized weakness   Lower Extremity Assessment Lower Extremity Assessment: Generalized weakness   Cervical / Trunk Assessment Cervical / Trunk Assessment: Kyphotic   Communication Communication Communication: HOH   Cognition Arousal/Alertness: Awake/alert Behavior During Therapy: Agitated Overall Cognitive Status: No family/caregiver present to determine baseline cognitive functioning Area of Impairment: Memory;Awareness;Problem solving;Safety/judgement                     Memory: Decreased short-term memory   Safety/Judgement: Decreased awareness of safety Awareness: Emergent Problem Solving: Slow processing;Requires verbal cues General Comments: Pt somewhat aware of cognitive deficits, reporting confusion, yet beomes very agitated quickly when he gets confused   General Comments  Pt was alert and oriented, however became confused and agitated easily with multiple step commands. Pt would complement therapist one minute and then be yelling the next. Attempted to call pt's wife to establish baseline but she did not answer            Home Living Family/patient expects to be discharged to:: Private residence Living Arrangements: Spouse/significant other Available Help at Discharge: Family Type of Home: House Home Access: Stairs to enter Technical brewer of Steps: 6 Entrance Stairs-Rails: [place]: One level     Bathroom Shower/Tub: Other (comment)(sponge bath only)   Bathroom Toilet: Standard Bathroom Accessibility: Yes   Home Equipment: Walker - 2 wheels;Cane - single point;Bedside commode;Other (comment)   Additional Comments: Pt very sedentary at home, doesn't get dressed, sits on couch all day      Prior Functioning/Environment Level of Independence: Needs assistance  Gait / Transfers Assistance Needed: walks household distances  without DME; has cane and RW, but doesn't use them ADL's / Homemaking Assistance Needed: assist with LB self care, sponge bathes, no IADLs (spouse completes med mgmt)    Comments: pt sedentary        OT Problem List: Decreased strength;Decreased activity tolerance;Impaired balance (sitting and/or standing);Cardiopulmonary status limiting activity;Obesity;Decreased knowledge of use of DME or AE;Decreased safety awareness;Decreased cognition      OT Treatment/Interventions: Self-care/ADL training;Balance training;Therapeutic exercise;Therapeutic activities;Energy conservation;DME and/or AE instruction;Patient/family education    OT Goals(Current goals can be found in the care plan section) Acute Rehab OT Goals Patient Stated Goal: none stated OT Goal Formulation: With patient Time For Goal Achievement: 05/16/19 Potential to Achieve Goals: Good  OT Frequency: Min 2X/week   Barriers to D/C: Other (comment)(unknown caregiver support)             AM-PAC OT "6 Clicks" Daily Activity     Outcome Measure Help from another person eating meals?: None Help from another person taking care of personal grooming?: A Little Help from another person toileting, which includes using toliet, bedpan, or urinal?: A Little Help from another person bathing (including washing, rinsing, drying)?: A Lot Help from another person to put on and taking off regular upper body clothing?: A Little Help from another person to put on and taking off regular lower body clothing?: A Lot 6 Click Score: 17   End of Session Equipment Utilized During Treatment: Gait belt;Rolling walker;Oxygen Nurse Communication: Mobility status  Activity Tolerance: Patient tolerated treatment well Patient left: with call bell/phone within reach;in chair;with chair alarm set  OT Visit Diagnosis: Other abnormalities of gait  and mobility (R26.89);Muscle weakness (generalized) (M62.81)                Time: 6301-6010 OT Time  Calculation (min): 35 min Charges:  OT General Charges $OT Visit: 1 Visit OT Evaluation $OT Eval Moderate Complexity: 1 Mod OT Treatments $Self Care/Home Management : 8-22 mins  Curtis Sites OTR/L  05/06/2019, 8:40 AM

## 2019-05-06 NOTE — Progress Notes (Signed)
Echocardiogram 2D Echocardiogram has been performed.  [place] Deputy Shikha Bibb 05/06/2019, 9:53 AM

## 2019-05-06 NOTE — Progress Notes (Signed)
Carotid duplex       has been completed. Preliminary results can be found under CV proc through chart review. Caliph Borowiak, BS, RDMS, RVT   

## 2019-05-06 NOTE — Evaluation (Signed)
Physical Therapy Evaluation Patient Details Name: [name] MRN: [id number] DOB: 09-Nov-1940 Today's Date: 05/06/2019   History of Present Illness  [name] is a 78 y.o. male with medical history significant for chronic respiratory failure with hypoxia on supplemental O2, stage IV COPD, CAD status post CABG, chronic diastolic CHF, AAA status post endovascular repair, insulin-dependent type 2 diabetes, hypertension, hyperlipidemia, anemia, depression, anxiety, and bipolar disorder who presents to the ED for evaluation after a fall at home, getting up from the toilet; reports intermittent dizziness  Clinical Impression  Pt admitted with above diagnosis. Pt currently with functional limitations due to the deficits listed below (see PT Problem List). At the time of PT eval pt was able to perform transfers and ambulation with gross min guard assist for balance support and safety with the RW. Pt with fluctuating behavior throughout session and was borderline agitated at times. His demeanor went from cracking jokes (some inappropriate) to being frustrated (near agitated) and with an overall undertone of being forced into working with therapies against his will. Pt was educated on his right to refuse yet pt insisting to continue, stating "I'm going to do whatever you tell me to so you can't spread lies about me to the doctor and mess up my discharge". Pt demanding return home and reports he will refuse going to a SNF. Encouraged pt to work with [facility] therapies upon d/c however pt reports "my wife told them all not to come back." Would like to confirm with wife that she is able to assist him as needed at home, as it appears he has had many falls, and continues to be at a high risk for falls. Acutely, pt will benefit from skilled PT to increase their independence and safety with mobility to allow discharge to the venue listed below.       Follow Up Recommendations Home health PT;Supervision/Assistance -  24 hour; [facility] services as pt refusing SNF    Equipment Recommendations  None recommended by PT    Recommendations for Other Services       Precautions / Restrictions Precautions Precautions: Fall Restrictions Weight Bearing Restrictions: No      Mobility  Bed Mobility Overal bed mobility: Modified Independent             General bed mobility comments: No assist to transition to/from EOB. Min use of rails.   Transfers Overall transfer level: Needs assistance Equipment used: Rolling walker (2 wheeled) Transfers: Sit to/from Stand Sit to Stand: Supervision Stand pivot transfers: Min guard;From elevated surface       General transfer comment: Close guard for balance. Hands-on assist for controlled descent to chair.  Ambulation/Gait Ambulation/Gait assistance: Min guard Gait Distance (Feet): 20 Feet(10 to sink, seated rest, 10 back to the bed. ) Assistive device: Rolling walker (2 wheeled) Gait Pattern/deviations: Decreased step length - right;Decreased step length - left Gait velocity: Decreased Gait velocity interpretation: <1.8 ft/sec, indicate of risk for recurrent falls General Gait Details: Pt was able to ambulate to/from sink for ADL task. Seated rest break taken after standing at the sink to wash his hands and face. Required hands-on guarding for safety with RW and 1 instance to manage walker (wheel caught behind leg of chair). Pt reports he was distracted during this time.   Stairs            Wheelchair Mobility    Modified Rankin (Stroke Patients Only)       Balance Overall balance assessment: Needs  assistance Sitting-balance support: No upper extremity supported;Feet supported Sitting balance-Leahy Scale: Good     Standing balance support: During functional activity;No upper extremity supported Standing balance-Leahy Scale: Poor Standing balance comment: Reliant on UE support                             Pertinent Vitals/Pain  Pain Assessment: Faces Faces Pain Scale: Hurts little more Pain Location: L knee and hip Pain Descriptors / Indicators: Aching Pain Intervention(s): Monitored during session    Home Living Family/patient expects to be discharged to:: Private residence Living Arrangements: Spouse/significant other Available Help at Discharge: Family;Personal care attendant Type of Home: House Home Access: Stairs to enter Entrance Stairs-Rails: Psychiatric nurse of Steps: 6 Home Layout: One level Home Equipment: Environmental consultant - 2 wheels;Cane - single point;Bedside commode;Other (comment) Additional Comments: Pt very sedentary at home, doesn't get dressed, sits on couch all day    Prior Function Level of Independence: Needs assistance   Gait / Transfers Assistance Needed: walks household distances without DME; has cane and RW, but doesn't use them  ADL's / Homemaking Assistance Needed: assist with LB self care, sponge bathes, no IADLs (spouse completes med mgmt)   Comments: pt sedentary     Hand Dominance   Dominant Hand: Right    Extremity/Trunk Assessment   Upper Extremity Assessment Upper Extremity Assessment: Generalized weakness    Lower Extremity Assessment Lower Extremity Assessment: Generalized weakness    Cervical / Trunk Assessment Cervical / Trunk Assessment: Kyphotic  Communication   Communication: HOH  Cognition Arousal/Alertness: Awake/alert Behavior During Therapy: WFL for tasks assessed/performed;Agitated Overall Cognitive Status: No family/caregiver present to determine baseline cognitive functioning Area of Impairment: Memory;Awareness;Problem solving;Safety/judgement                     Memory: Decreased short-term memory   Safety/Judgement: Decreased awareness of safety Awareness: Emergent Problem Solving: Slow processing;Requires verbal cues General Comments: Pt somewhat aware of cognitive deficits, reporting confusion, yet beomes very  agitated quickly when he gets confused      General Comments      Exercises     Assessment/Plan    PT Assessment Patient needs continued PT services  PT Problem List Decreased strength;Decreased range of motion;Decreased activity tolerance;Decreased balance;Decreased mobility;Decreased cognition;Decreased knowledge of use of DME;Decreased safety awareness;Decreased knowledge of precautions;Cardiopulmonary status limiting activity       PT Treatment Interventions DME instruction;Gait training;Stair training;Functional mobility training;Therapeutic activities;Therapeutic exercise;Balance training;Cognitive remediation;Patient/family education    PT Goals (Current goals can be found in the Care Plan section)  Acute Rehab PT Goals Patient Stated Goal: none stated PT Goal Formulation: Patient unable to participate in goal setting Time For Goal Achievement: 05/12/19 Potential to Achieve Goals: Good    Frequency Min 3X/week   Barriers to discharge        Co-evaluation               AM-PAC PT "6 Clicks" Mobility  Outcome Measure Help needed turning from your back to your side while in a flat bed without using bedrails?: None Help needed moving from lying on your back to sitting on the side of a flat bed without using bedrails?: None Help needed moving to and from a bed to a chair (including a wheelchair)?: A Little Help needed standing up from a chair using your arms (e.g., wheelchair or bedside chair)?: A Little Help needed to walk in hospital room?: A Little  Help needed climbing 3-5 steps with a railing? : A Lot 6 Click Score: 19    End of Session Equipment Utilized During Treatment: Gait belt Activity Tolerance: Other (comment);Patient limited by fatigue Patient left: in bed;with call bell/phone within reach;with bed alarm set Nurse Communication: Mobility status PT Visit Diagnosis: Unsteadiness on feet (R26.81);Other abnormalities of gait and mobility (R26.89);Muscle  weakness (generalized) (M62.81);History of falling (Z91.81);Repeated falls (R29.6);Difficulty in walking, not elsewhere classified (R26.2)    Time: 7356-7014 PT Time Calculation (min) (ACUTE ONLY): 50 min   Charges:   PT Evaluation $PT Eval Moderate Complexity: 1 Mod PT Treatments $Gait Training: 8-22 mins $Therapeutic Activity: 8-22 mins        Rolinda Roan, PT, DPT Acute Rehabilitation Services Pager: [phone] Office: [phone]   Thelma Comp 05/06/2019, 1:03 PM

## 2019-05-07 IMAGING — NM NM PULMONARY VENT & PERF
16 series · 16 of 16 positions shown · non-contrast
Comparison: None

Correlation: Chest radiograph 10/02/2018

CLINICAL DATA: Elevated D-dimer question pulmonary embolism,
worsening shortness of breath, known history of CHF, COPD, essential
hypertension, type II diabetes mellitus

EXAM:
NUCLEAR MEDICINE VENTILATION - PERFUSION LUNG SCAN
TECHNIQUE: Ventilation images were obtained in multiple projections using
inhaled aerosol 0c-MMm DTPA. Perfusion images were obtained in
multiple projections after intravenous injection of 0c-MMm MAA.
RADIOPHARMACEUTICALS:  30 mCi of 0c-MMm DTPA aerosol inhalation and
4 mCi UcNNm MAA IV

[Series 1: ant/post vent · 4.14mm/px · 1 of 1 slices shown (1 of 2)]
[im 1/1]
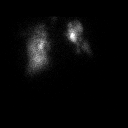

[Series 1: ant/post vent · 4.14mm/px · 1 of 1 slices shown (2 of 2)]
[im 1/1]
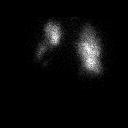

[Series 2: lao/rpo vent · 4.14mm/px · 1 of 1 slices shown (1 of 2)]
[im 1/1]
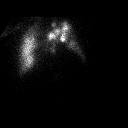

[Series 2: lao/rpo vent · 4.14mm/px · 1 of 1 slices shown (2 of 2)]
[im 1/1]
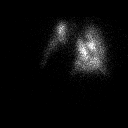

[Series 3: lpo/rao vent · 4.14mm/px · 1 of 1 slices shown (1 of 2)]
[im 1/1]
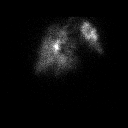

[Series 3: lpo/rao vent · 4.14mm/px · 1 of 1 slices shown (2 of 2)]
[im 1/1  full-range]
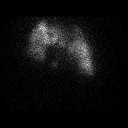

[Series 4: lt lat/rt lat vent · 4.14mm/px · 1 of 1 slices shown (1 of 2)]
[im 1/1  full-range]
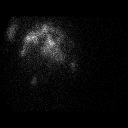

[Series 4: lt lat/rt lat vent · 4.14mm/px · 1 of 1 slices shown (2 of 2)]
[im 1/1  full-range]
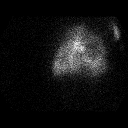

[Series 5: lt lat/rt lat perf · 4.14mm/px · 1 of 1 slices shown (1 of 2)]
[im 1/1]
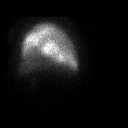

[Series 5: lt lat/rt lat perf · 4.14mm/px · 1 of 1 slices shown (2 of 2)]
[im 1/1]
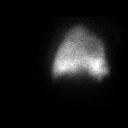

[Series 6: lpo/rao perf · 4.14mm/px · 1 of 1 slices shown (1 of 2)]
[im 1/1]
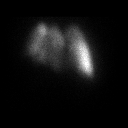

[Series 6: lpo/rao perf · 4.14mm/px · 1 of 1 slices shown (2 of 2)]
[im 1/1]
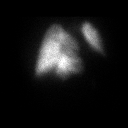

[Series 7: ant/post perf · 4.14mm/px · 1 of 1 slices shown (1 of 2)]
[im 1/1]
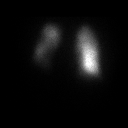

[Series 7: ant/post perf · 4.14mm/px · 1 of 1 slices shown (2 of 2)]
[im 1/1]
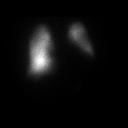

[Series 8: lao/rpo perf · 4.14mm/px · 1 of 1 slices shown (1 of 2)]
[im 1/1]
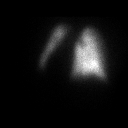

[Series 8: lao/rpo perf · 4.14mm/px · 1 of 1 slices shown (2 of 2)]
[im 1/1]
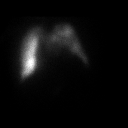

[16 of 16 positions shown; findings below may reference images not displayed]

FINDINGS: Ventilation: Central airway deposition of aerosol. Diminished
ventilation in LEFT lower lobe. Enlargement of cardiac silhouette.

Perfusion: Diminished perfusion in LEFT lower lobe though matching
the ventilation defect. Single small subsegmental perfusion defect
at superior segment RIGHT lower lobe. No additional perfusion
defects identified. Enlargement of cardiac silhouette.

Chest radiograph: Enlargement of cardiac silhouette post CABG with
bibasilar atelectasis greater on LEFT.
IMPRESSION: Cardiomegaly.

Diminished ventilation and perfusion in LEFT lower lobe with
matching radiographic opacity in the LEFT lower lobe on chest
radiograph of 10/02/2018.

Large triple matched lower lobe abnormalities represent an
intermediate probability for pulmonary embolism.

## 2019-05-07 IMAGING — CT CT ANGIO CHEST
2 of 6 series · 18 of 36 positions shown · IV contrast (iopamidol)
Comparison: V/Q chest October 03, 2018 and chest radiograph
October 02, 2018 and CT chest May 08, 2018

CLINICAL DATA: Hypoxia and shortness of breath. Abnormal V/Q
examination.

EXAM:
CT ANGIOGRAPHY CHEST WITH CONTRAST
TECHNIQUE: Multidetector CT imaging of the chest was performed using the
standard protocol during bolus administration of intravenous
contrast. Multiplanar CT image reconstructions and MIPs were
obtained to evaluate the vascular anatomy.
CONTRAST:  100mL [id number] IOPAMIDOL ([id number]) INJECTION 76%

[Series 7: pe thins · axial · 0.98mm/px · z∈[-69,+221]mm · 17 of 460 slices shown]
[im 23/460  lung]
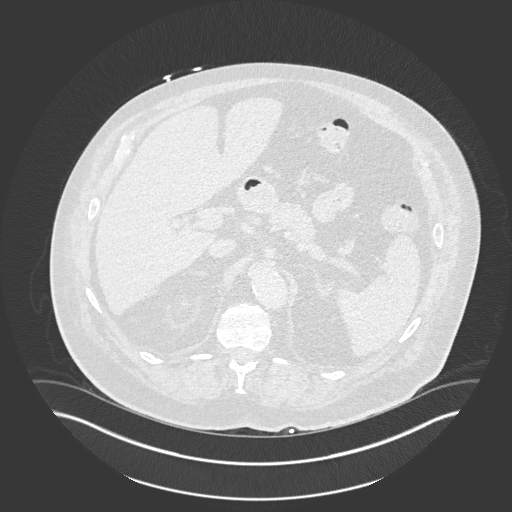
[im 46/460  mediastinal]
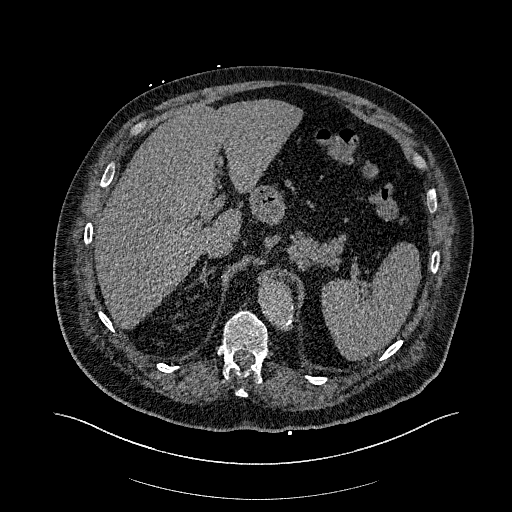
[im 69/460  lung]
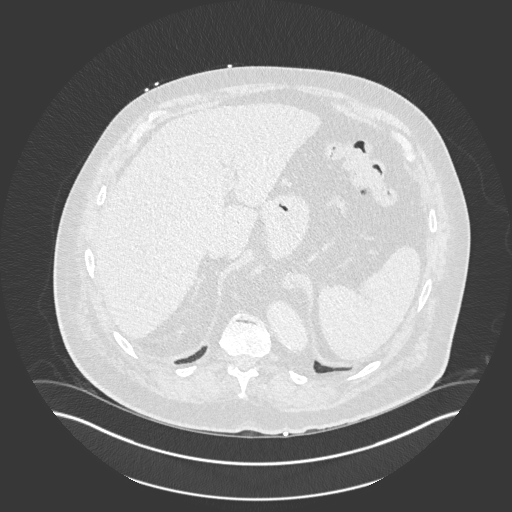
[im 92/460  mediastinal]
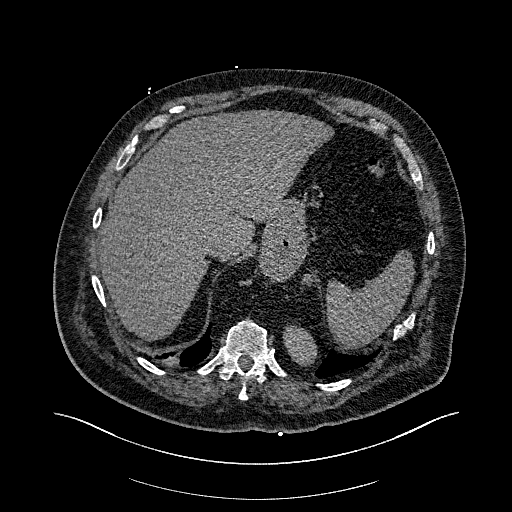
[im 138/460  lung]
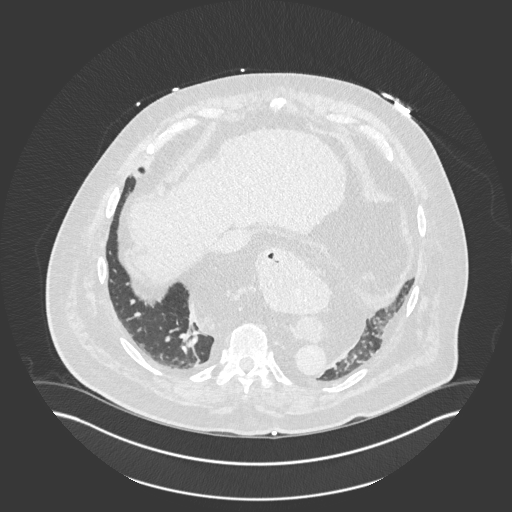
[im 161/460  mediastinal]
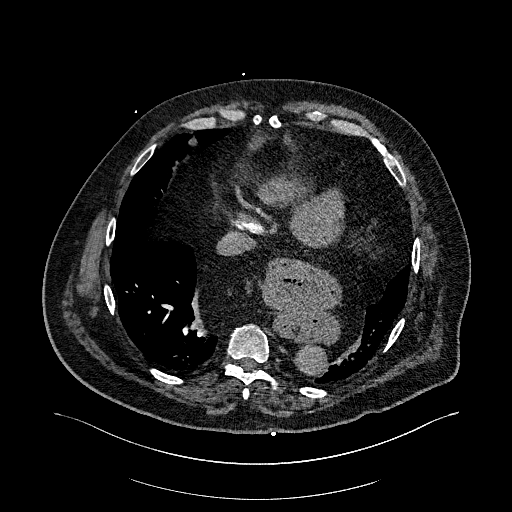
[im 184/460  lung]
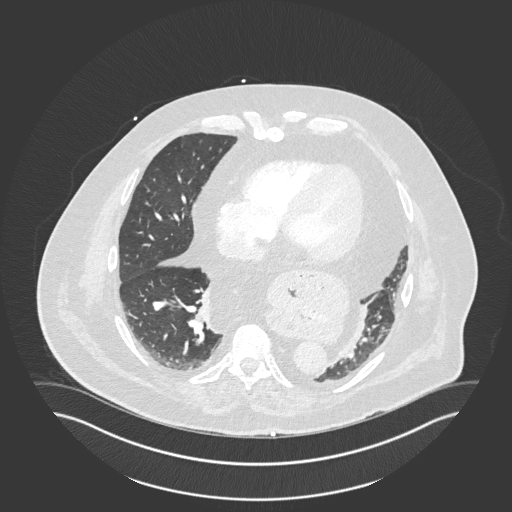
[im 207/460  mediastinal]
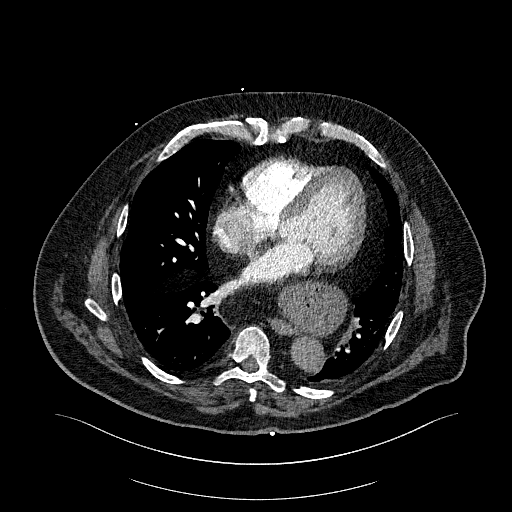
[im 230/460  lung]
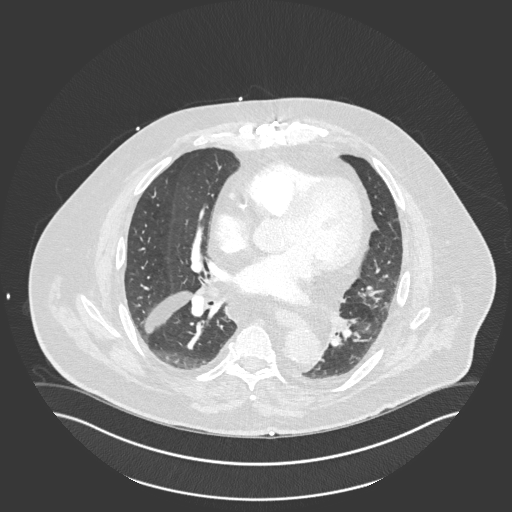
[im 253/460  mediastinal]
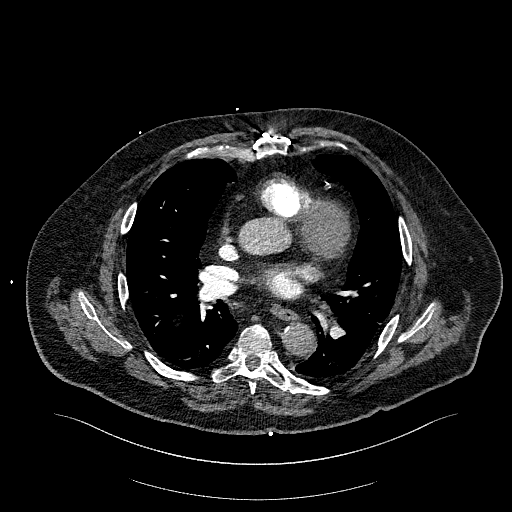
[im 276/460  lung]
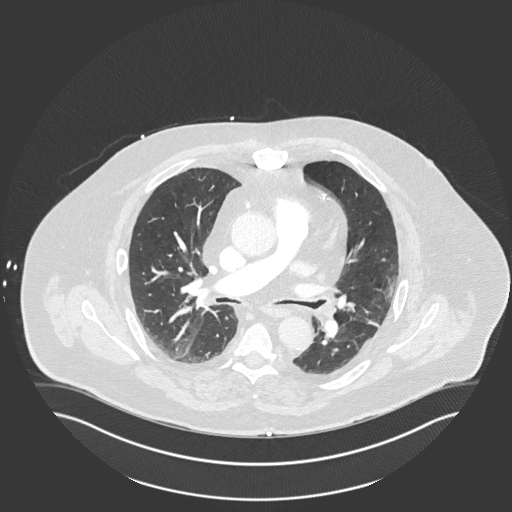
[im 299/460  mediastinal]
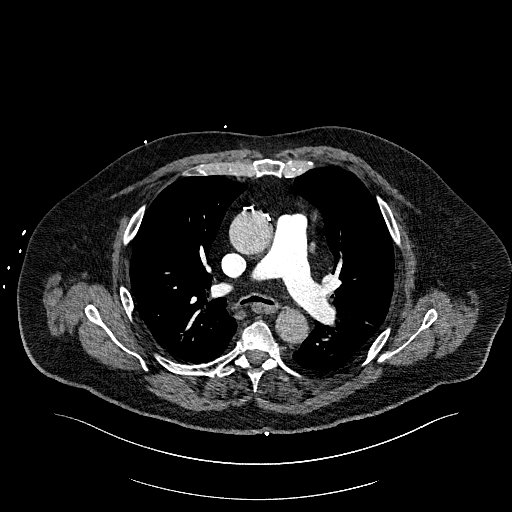
[im 322/460  lung]
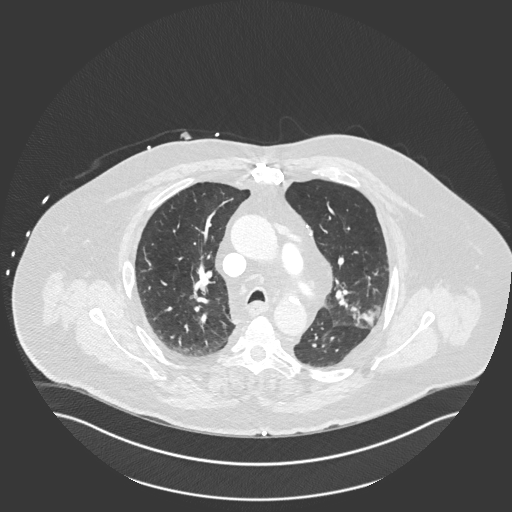
[im 368/460  mediastinal]
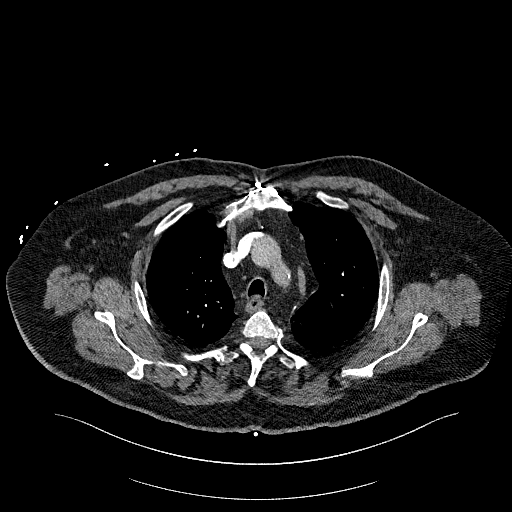
[im 391/460  lung]
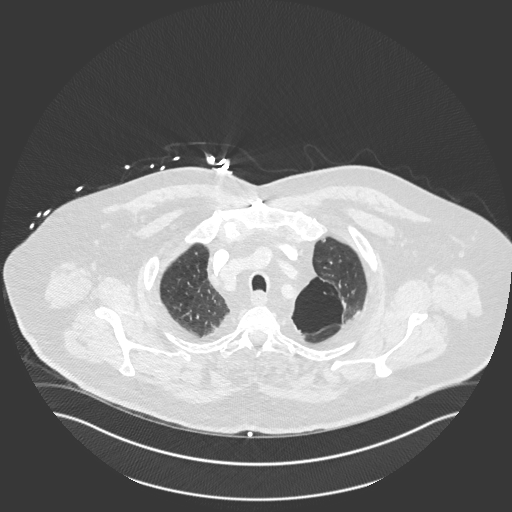
[im 414/460  mediastinal]
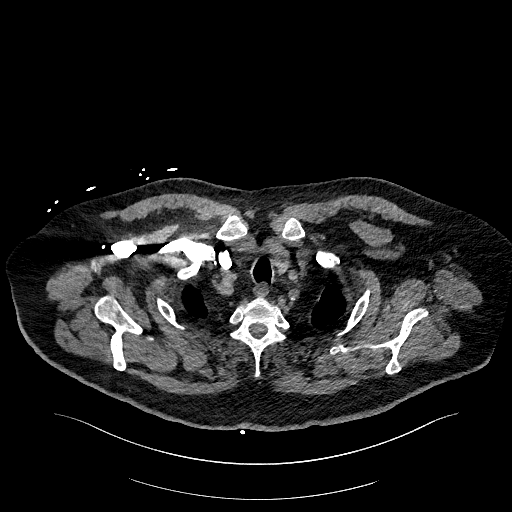
[im 437/460  lung]
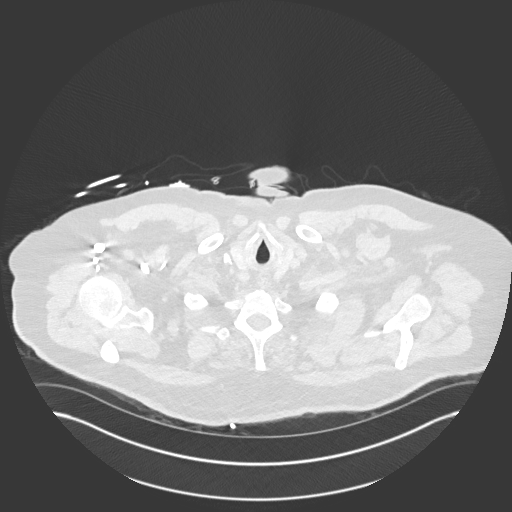

[Series 8: pe 2mm cor · coronal · 0.64mm/px · 1 of 168 slices shown]
[im 84/168  mediastinal]
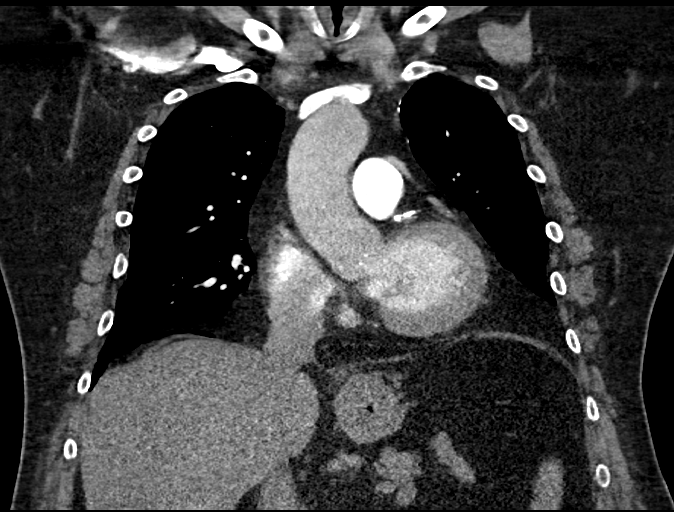

[18 of 36 positions shown; findings below may reference images not displayed]

FINDINGS: CARDIOVASCULAR: Adequate contrast opacification of the pulmonary
artery's. Main pulmonary artery is not enlarged. No pulmonary
arterial filling defects to the level of the subsegmental branches.
The heart is upper limits of normal in size. No pericardial
effusion. Moderate coronary artery calcifications. No pericardial
effusion. 4.1 cm aneurysmal ascending aorta. Mild calcific
atherosclerosis, 2 vessel arch is a normal variant.

MEDIASTINUM/NODES: No lymphadenopathy by CT size criteria. Prominent
mediastinal fat.

LUNGS/PLEURA: Tracheobronchial tree is patent, no pneumothorax.
Bronchial wall thickening. Increasingly narrowed tracheobronchial
tree seen with tracheobronchomalacia with collapse of LEFT lower
lobe bronchi. Bilateral lower lobe atelectasis, pleural fat
extending into RIGHT major fissure. LEFT upper lobe atelectasis
versus scarring. LEFT apical bulla. No pleural effusion.

UPPER ABDOMEN: Non-acute.  Large hiatal hernia.

MUSCULOSKELETAL: Non-acute. Status post median sternotomy. Old ninth
posterior rib fracture with nonunion.

Review of the MIP images confirms the above findings.
IMPRESSION: 1. No acute pulmonary embolism.
2. Tracheobronchomalacia with superimposed bronchial wall thickening
seen with bronchitis or reactive airway disease. Scattered
atelectasis.
3. Large hiatal hernia.
4. **An incidental finding of potential clinical significance has
been found. 4.1 cm aneurysmal ascending aorta. Recommend annual
imaging followup by CTA or MRA. This recommendation follows 2171
ACCF/AHA/AATS/ACR/ASA/SCA/RANGRA/NUNO/EVON/BOSADA Guidelines for the
Diagnosis and Management of Patients with Thoracic Aortic Disease.
Circulation. 2171; 121: [id number]**

Aortic Atherosclerosis (4YYSV-KTG.G).

## 2019-05-10 ENCOUNTER — Telehealth: Payer: Self-pay | Admitting: Adult Health Nurse Practitioner

## 2019-05-10 NOTE — Telephone Encounter (Signed)
Tried calling patient to schedule an In-person Palliative f/u visit - unable to speak with him - a message comes up on the phone stating the person you are trying to reach is not accepting calls at this time, please try again later.  I tried calling patient's wife cell and hers done the same thing.  Will f/u at a later time.

## 2019-05-15 IMAGING — DX DG CHEST 2V
3 series · 3 of 3 positions shown · non-contrast
Comparison: CXR 10/02/2018, chest CT 10/03/2018

CLINICAL DATA: Cough, dizziness and dyspnea x1 day

EXAM:
CHEST - 2 VIEW

[x chest ap]
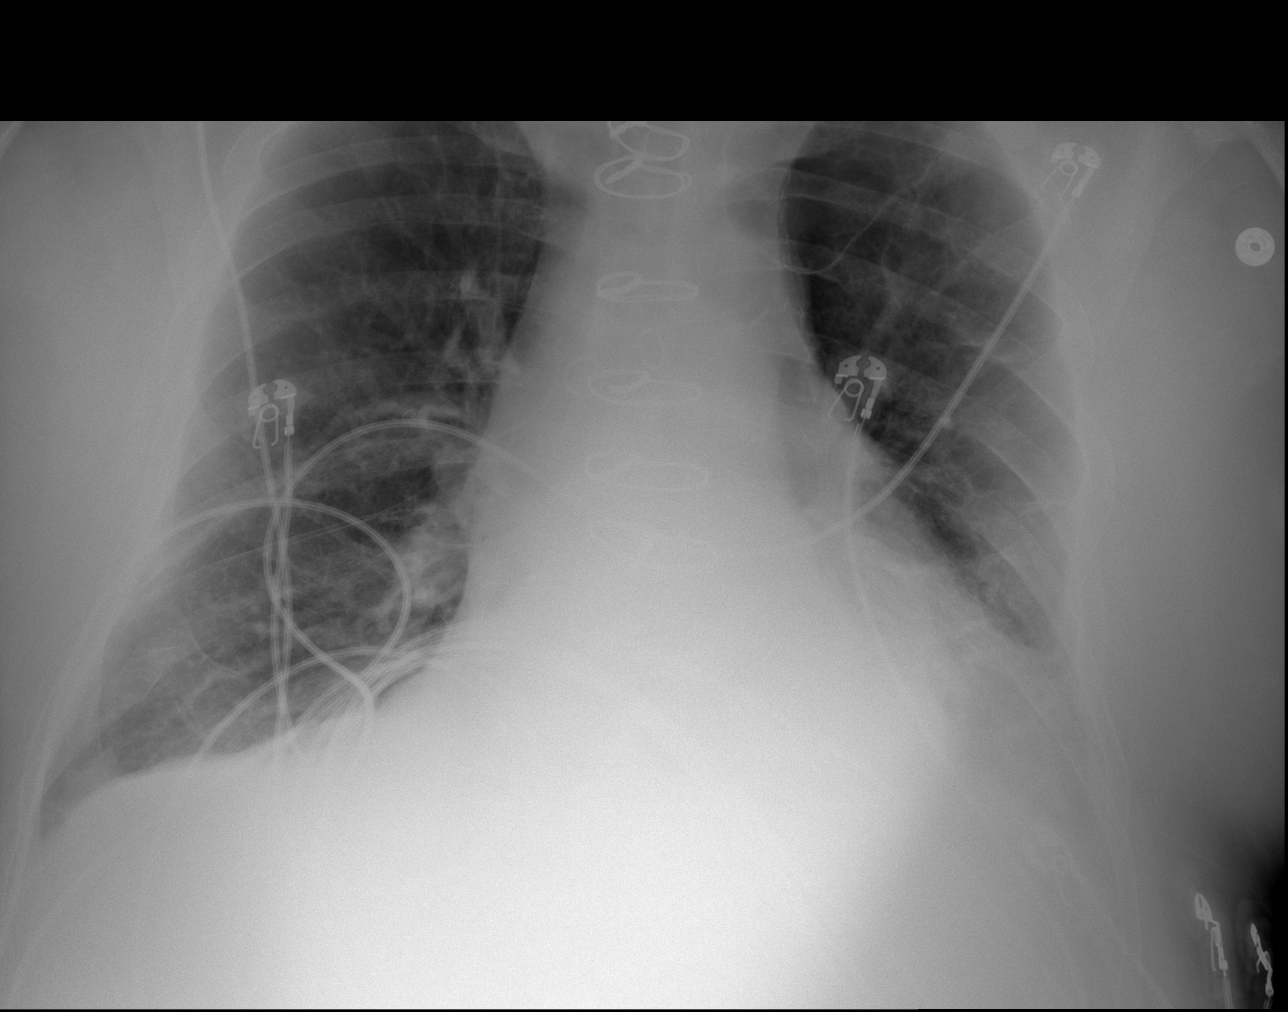

[w chest lat (1 of 2)]
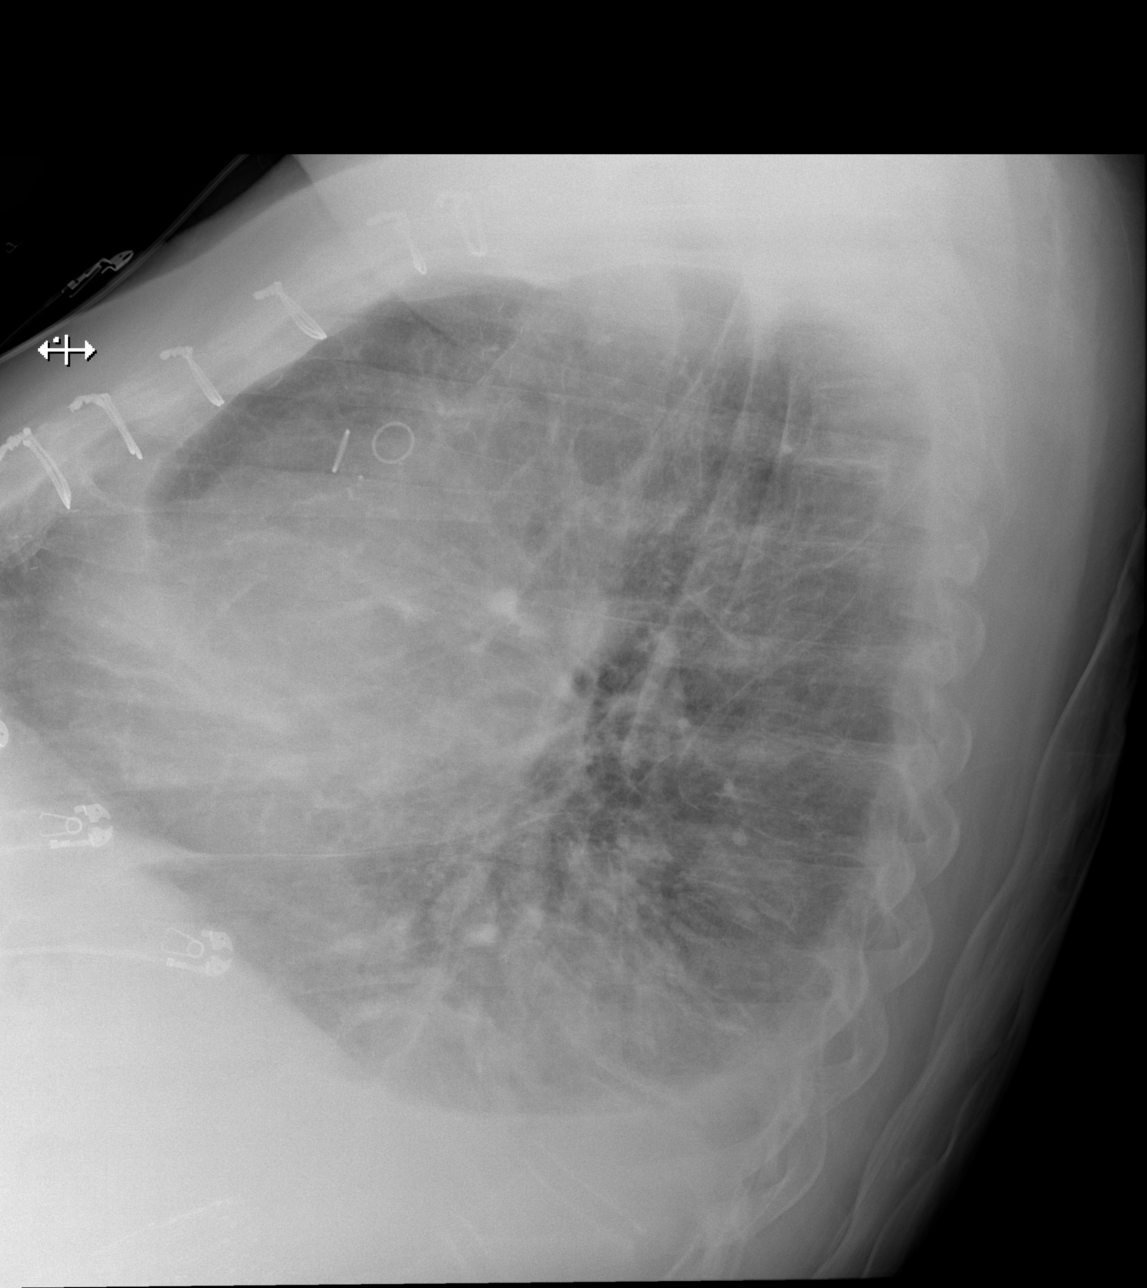

[w chest lat (2 of 2)]
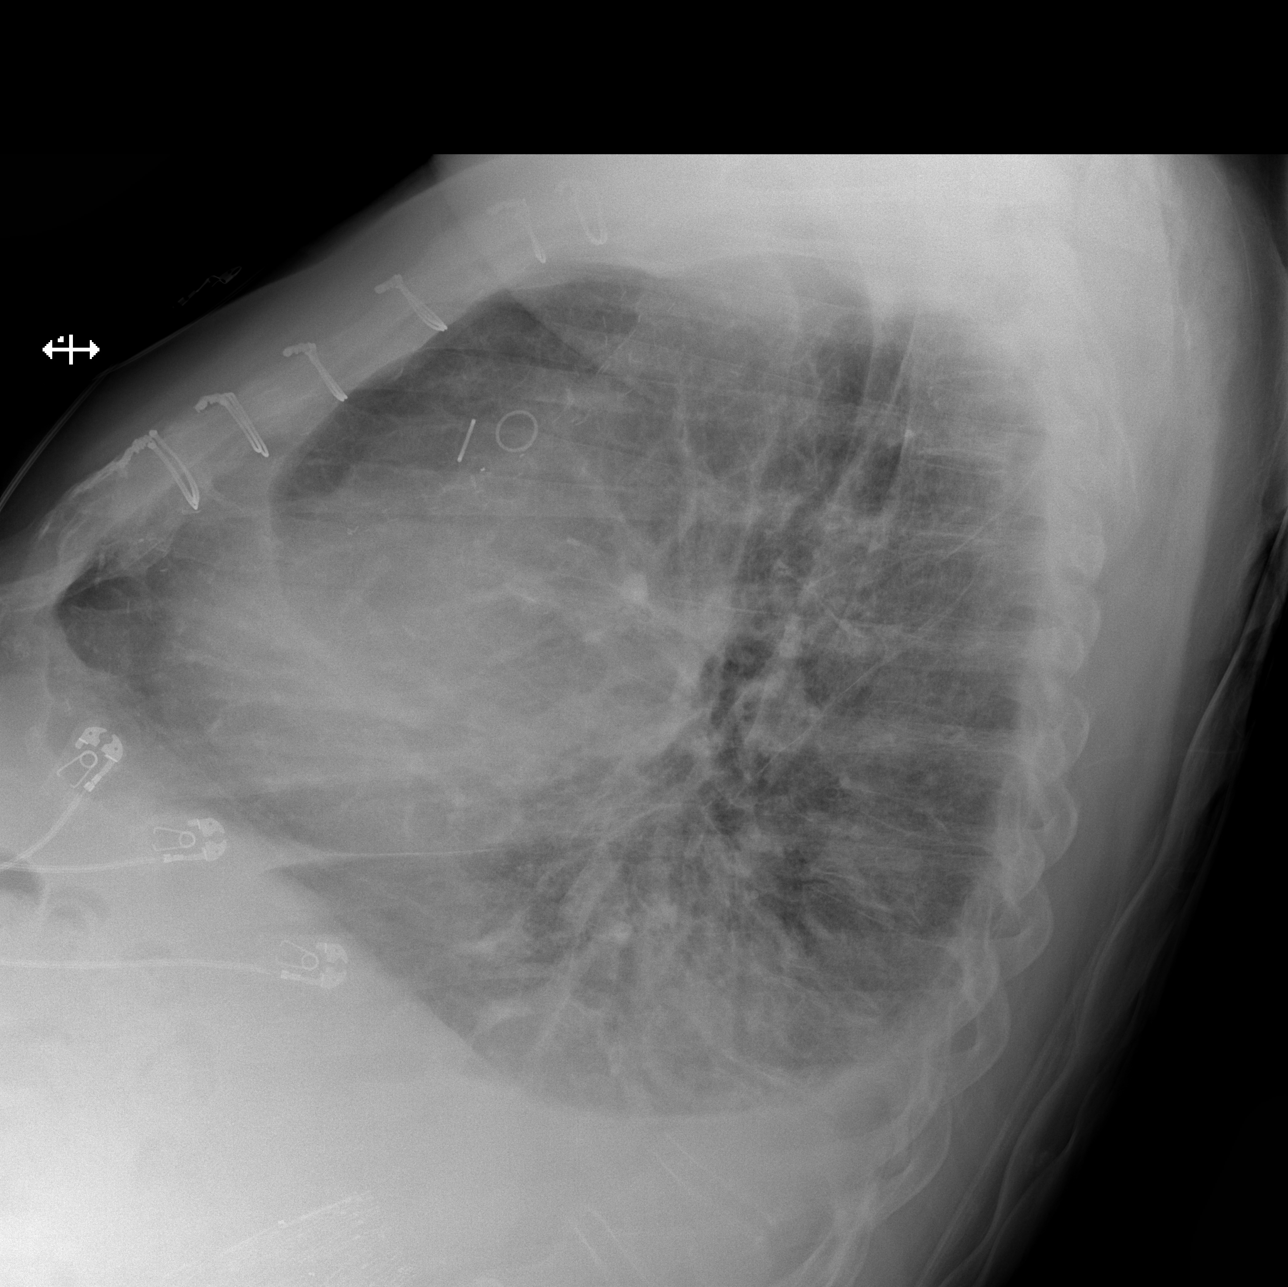

[3 of 3 positions shown; findings below may reference images not displayed]

FINDINGS: Stable cardiomegaly with aortic atherosclerosis. Median sternotomy
sutures are noted. Small to moderate moderate left posterior pleural
effusion is identified blunting the costophrenic angle. No overt
pulmonary edema. No acute osseous abnormality. Degenerative changes
are present along the dorsal spine.
IMPRESSION: 1. Cardiomegaly with aortic atherosclerosis.
2. Small to moderate left posterior pleural effusion.

## 2019-05-17 ENCOUNTER — Telehealth: Payer: Self-pay | Admitting: Adult Health Nurse Practitioner

## 2019-05-17 IMAGING — CT CT CHEST HIGH RESOLUTION W/O CM
2 of 6 series · 15 of 36 positions shown, 18 images · non-contrast
Comparison: Chest CT 10/03/2018.

CLINICAL DATA: 77-year-old male with history of chest pain.

EXAM:
CT CHEST WITHOUT CONTRAST
TECHNIQUE: Multidetector CT imaging of the chest was performed following the
standard protocol without intravenous contrast. High resolution
imaging of the lungs, as well as inspiratory and expiratory imaging,
was performed.

[Series 6: chest w/o 2mm st · axial · non-contrast · 0.92mm/px · z∈[-312,-24]mm · 12 of 162 slices shown, 15 images]
[im 9/162  mediastinal]
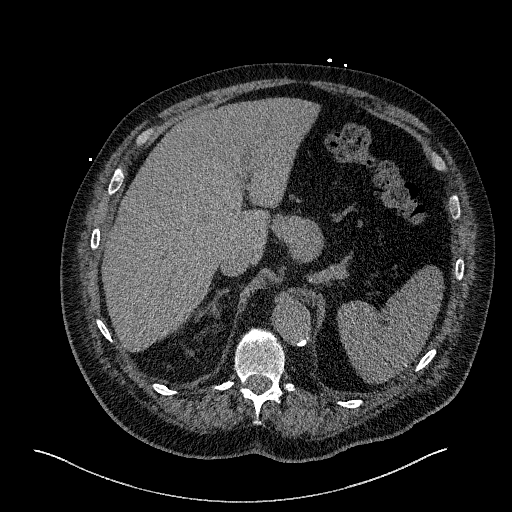
[im 9/162  lung]
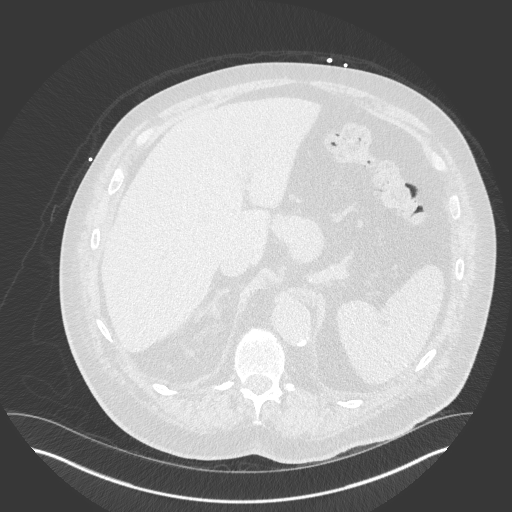
[im 26/162  lung]
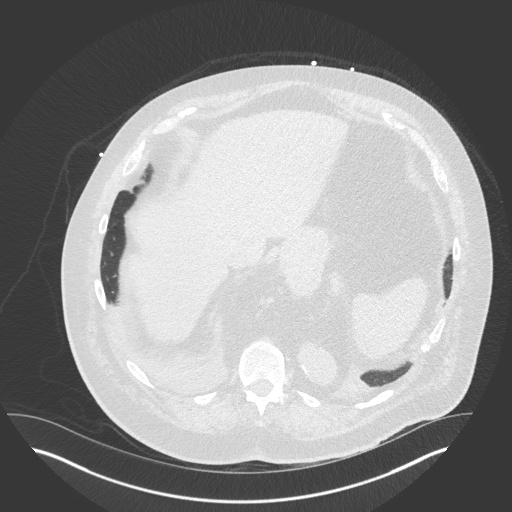
[im 34/162  lung]
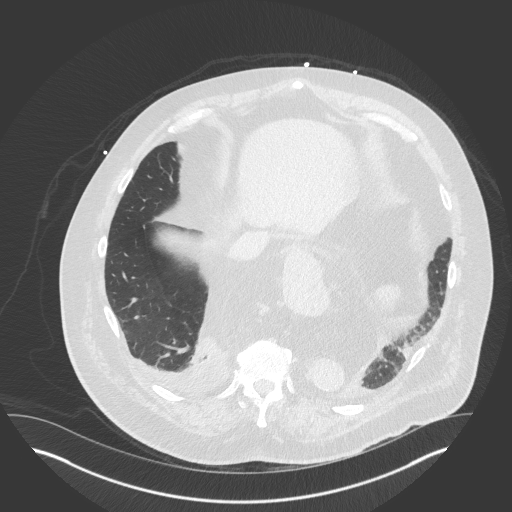
[im 51/162  lung]
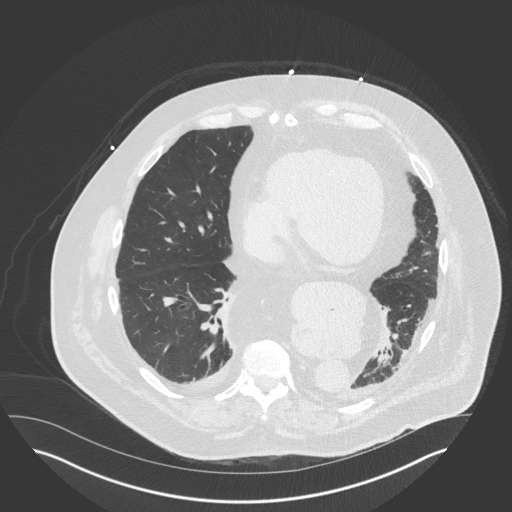
[im 60/162  mediastinal]
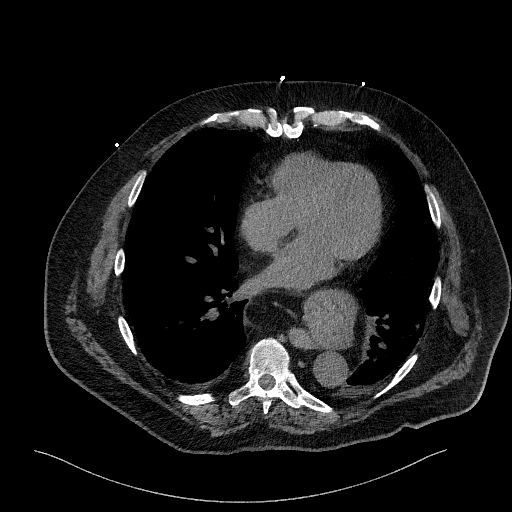
[im 60/162  lung]
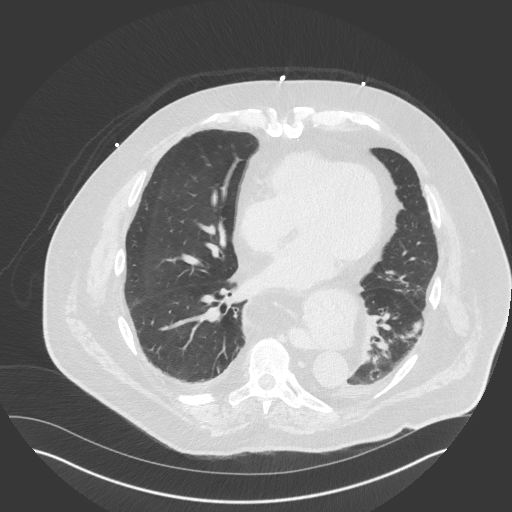
[im 77/162  lung]
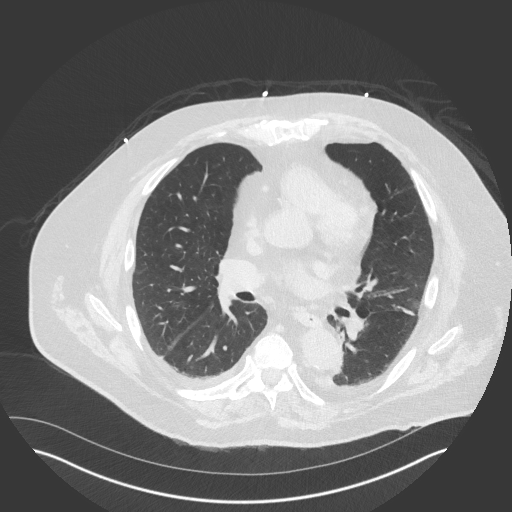
[im 85/162  lung]
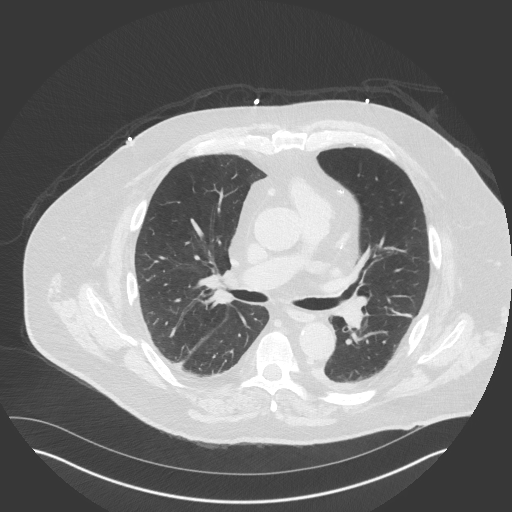
[im 102/162  lung]
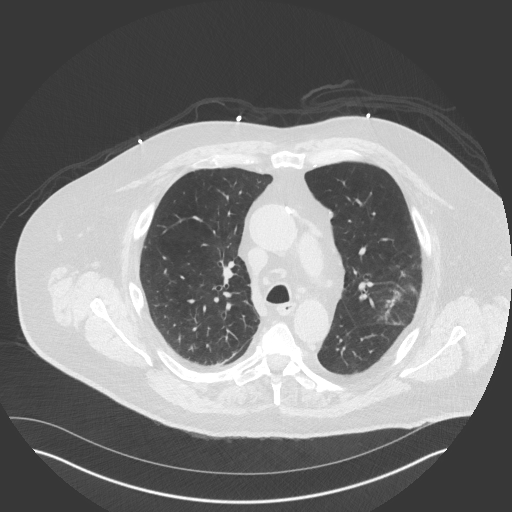
[im 111/162  mediastinal]
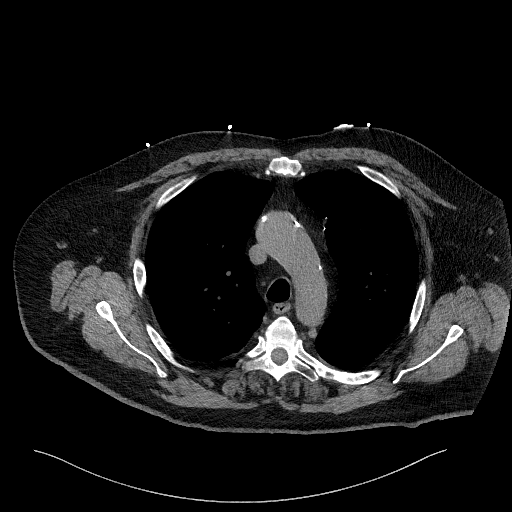
[im 111/162  lung]
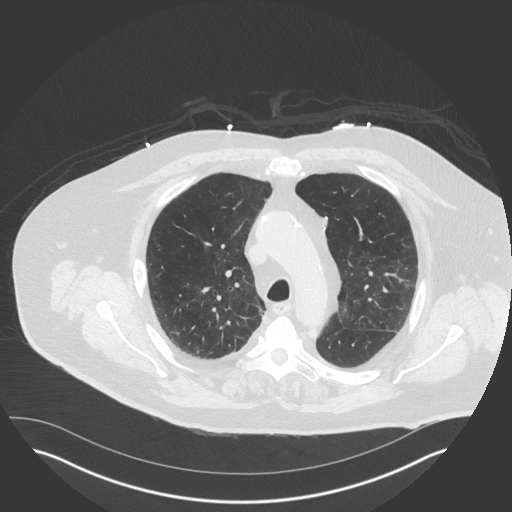
[im 128/162  lung]
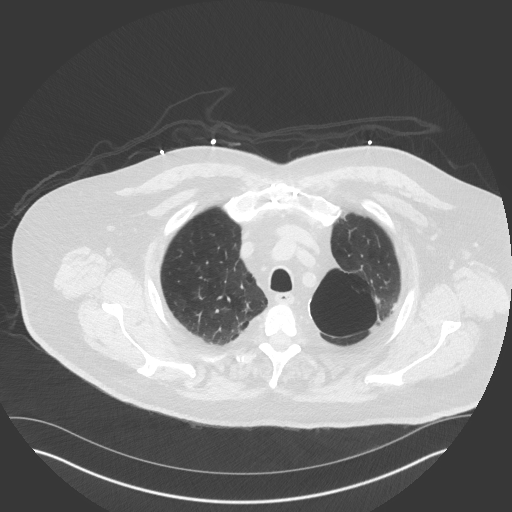
[im 136/162  lung]
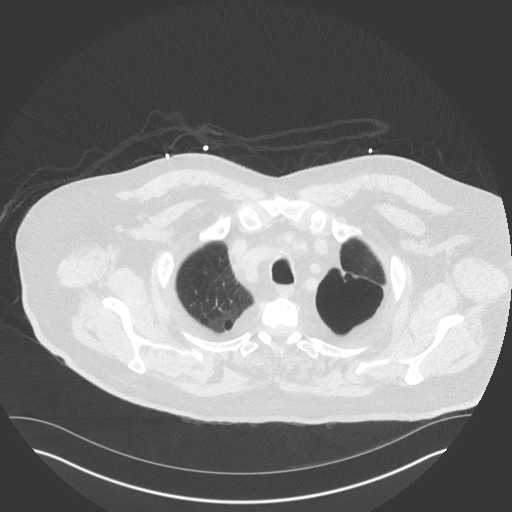
[im 153/162  lung]
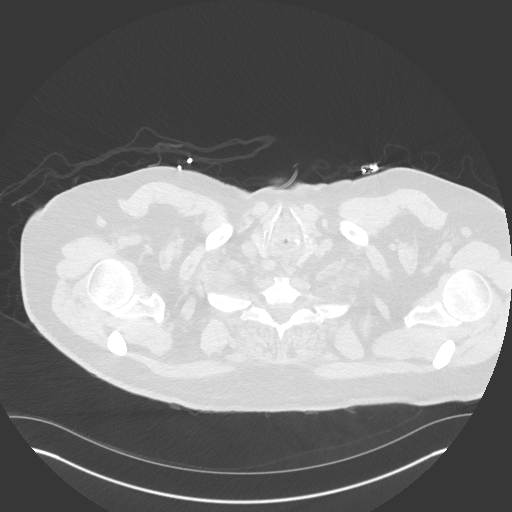

[Series 8: chest w/o 2mm st cor · coronal · non-contrast · 0.63mm/px · 3 of 151 slices shown]
[im 31/151  lung]
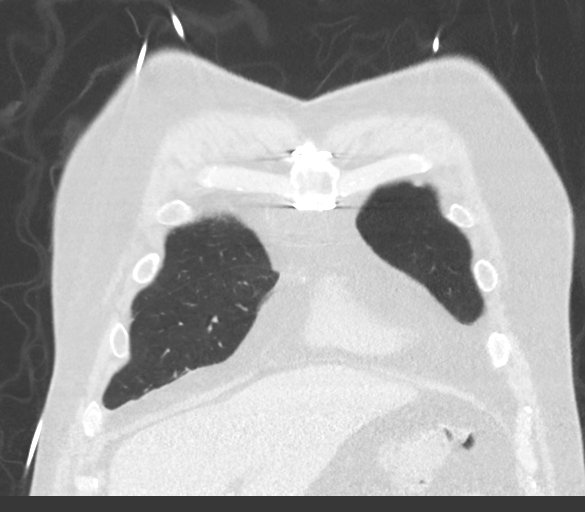
[im 61/151  lung]
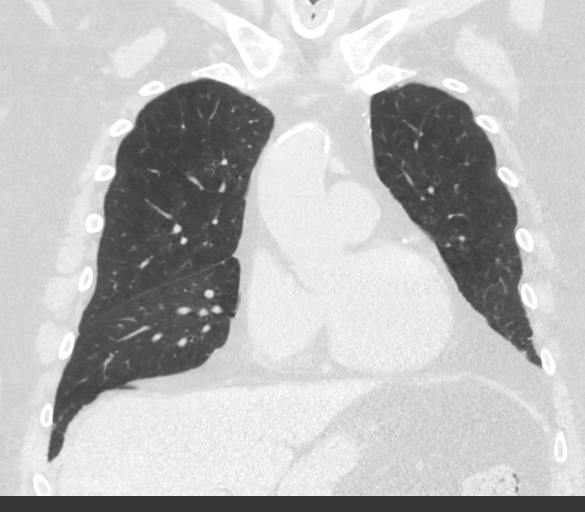
[im 91/151  lung]
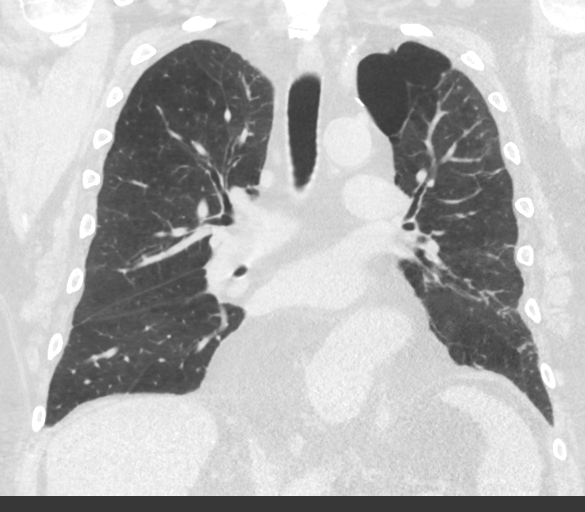

[15 of 36 positions shown; findings below may reference images not displayed]

FINDINGS: Cardiovascular: Heart size is normal. There is no significant
pericardial fluid, thickening or pericardial calcification. There is
aortic atherosclerosis, as well as atherosclerosis of the great
vessels of the mediastinum and the coronary arteries, including
calcified atherosclerotic plaque in the left main, left anterior
descending and right coronary arteries. Status post median
sternotomy for CABG including [REDACTED] to the LAD. Moderate
calcifications of the aortic valve.

Mediastinum/Nodes: No pathologically enlarged mediastinal or hilar
lymph nodes. Large hiatal hernia no axillary lymphadenopathy

Lungs/Pleura: High-resolution images demonstrate no significant
regions of ground-glass attenuation, subpleural reticulation,
traction bronchiectasis or frank honeycombing. Inspiratory and
expiratory imaging is unremarkable. No suspicious pulmonary nodules
or masses are noted. No acute consolidative airspace disease. Trace
bilateral pleural effusions lying dependently. Atelectasis and/or
scarring in the left lower lobe. Diffuse bronchial wall thickening
with generally mild centrilobular and paraseptal emphysema, in
addition to a large bulla in the left upper lobe near the apex.

Upper Abdomen: Mild diffuse low attenuation throughout the
visualized hepatic parenchyma, indicative of hepatic steatosis.
Aortic atherosclerosis.

Musculoskeletal: Median sternotomy wires. There are no aggressive
appearing lytic or blastic lesions noted in the visualized portions
of the skeleton.
IMPRESSION: 1. No definite findings to suggest interstitial lung disease.
2. Mild diffuse bronchial thickening with mild centrilobular and
paraseptal emphysema; imaging findings suggestive of underlying
COPD.
3. Aortic atherosclerosis, in addition to left main and 2 vessel
coronary artery disease. Status post median sternotomy for CABG
including [REDACTED] to the LAD.
4. There are calcifications of the aortic valve. Echocardiographic
correlation for evaluation of potential valvular dysfunction may be
warranted if clinically indicated.
5. Large hiatal hernia.
6. Hepatic steatosis.

Aortic Atherosclerosis ([id number].O) and Emphysema ([id number].X).

## 2019-05-17 NOTE — Telephone Encounter (Signed)
Spoke with patient and have scheduled an In-Person Palliative f/u visit for 05/22/19 @ 10AM.

## 2019-05-22 ENCOUNTER — Other Ambulatory Visit: Payer: Medicare HMO | Admitting: Adult Health Nurse Practitioner

## 2019-05-22 ENCOUNTER — Other Ambulatory Visit: Payer: Self-pay

## 2019-05-22 DIAGNOSIS — Z515 Encounter for palliative care: Secondary | ICD-10-CM

## 2019-05-22 NOTE — Progress Notes (Signed)
[facility] Consult Note Telephone: [phone]  Fax: [phone]  PATIENT NAME: [name] DOB: [birth year] MRN: [id number]  PRIMARY CARE PROVIDER:   [name], MD  REFERRING PROVIDER:  [name], MD [address] [place],   [address]  RESPONSIBLE PARTY:      Self [phone] Wife, [name] [phone]    RECOMMENDATIONS and PLAN:  1.  End stage COPD.  Patient does not complain of any worsening SOB or cough.  He has chronic DOE and cough which are stable at this time.  Continue current COPD medications  2.  Weakness.  Patient had a fall on 7/11 and was seen at hospital.  States that he hurt his left groin with this fall and states that it still hurts.  States that at times it feels like his legs give way causing him to fall.  States having vascular stents put in the groin area is concerned that he may have damaged something with the fall.  He was also in the hospital 7/19-7/20 for a syncopal episode/possible TIA.  Chest xray, CT of head and c-spine were negative for any acute findings.   On prior telephone visit with patient he had commented that the pain was lessening but now he just states that it is still painful and feels like something may be damaged with the stents or nerves.  Feels that is what is causing his legs to give out. He has had PT coming in but he still reports that he has difficulty walking from his bed to the bathroom. He did have some swelling of left groin but with his body habitus it is difficult to determine. Did not notice any ecchymosis or wounds in the area.  Will reach out to PCP to see if he wants to do any further work up.  3.  Goals of care.  Discussed conflicting information found in Epic on whether he wants to be a DNR or full code.  States that he wants CPR to be done and that he wants full interventions, except he only wants to be on a ventilator for a trial period.  Wants IV  fluids and Antibiotics as indicated and would like feeding tube for a trial period. Filled out MOST form, uploaded to [facility], and left original with patient. States that he feels like he needs more help in the home with him not being able to get up as much.  He also feels like his wife may have dementia as she is forgetting to pay bills and not helping him as much as she used to.  Will reach out to SW to discuss resources with patient.   I spent 60 minutes providing this consultation,  from 10:45 to 11:45. More than 50% of the time in this consultation was spent coordinating communication.   HISTORY OF PRESENT ILLNESS:  [name] is a 78 y.o. year old male with multiple medical problems including end stage COPD, CHF, HTN, CAD, DMT2, bipolar. Palliative Care was asked to help address goals of care.   CODE STATUS: full Code  PPS: 60% HOSPICE ELIGIBILITY/DIAGNOSIS: TBD  PHYSICAL EXAM:   General: sitting on edge of bed in NAD Cardiovascular: regular rate and rhythm Pulmonary: lung sounds significantly diminished but no abnormal breath sounds heard.  Normal respiratory effort Abdomen: soft, nontender, + bowel sounds GU: no suprapubic tenderness Extremities: no edema, no joint deformities Skin: no rashes Neurological: Weakness but otherwise nonfocal  PAST MEDICAL HISTORY:  Past Medical History:  Diagnosis Date   AAA (abdominal aortic aneurysm) ([place])    a. 12/2008 s/p 7cm, endovascular repair with coiling right hypogastric artery    Acute Cameron ulcer    Adenomatous duodenal polyp    Allergic rhinitis, cause unspecified    Anxiety    AVM (arteriovenous malformation) of colon with hemorrhage    Bipolar 1 disorder, mixed, moderate ([place]) 04/16/2015   CAD (coronary artery disease)    a. 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   Chronic diastolic CHF (congestive heart failure) ([place])    a. 04/2015 Echo: EF 55-60%, no rwma, Gr 1 DD, mild AI.    Complication of anesthesia    "if they sedate me for too long, they have to intubate me; then they can't get me to come out of it" (04/03/2017)   COPD (chronic obstructive pulmonary disease) ([place])    a. GOLD stage IV, started home O2. Severe bullous disease of LUL. Prolonged intubation after surgeries due to COPD.   Depression with anxiety 01/14/2013   Diabetes mellitus with complication (HCC)    Diverticulosis    Duodenal diverticulum    Duodenal ulcer    Emphysema of lung ([place])    Esophagitis    Essential hypertension 08/18/2009   Qualifier: Diagnosis of  By: Doy Mince LPN, Megan     GERD (gastroesophageal reflux disease)    GI bleed requiring more than 4 units of blood in 24 hours, ICU, or surgery    a. Hx bleeding gastric polyps, cecal & sigmoid AVMS s/p APC 03/30/14   Hiatal hernia    large   History of blood transfusion    "many many many; related to blood loss; anemia"   Hyperlipidemia    Insomnia 08/10/2014   Leucocytosis 12/04/2013   Major depressive disorder    Memory loss    Morbid obesity ([place])    Multiple gastric polyps    Myocardial infarction ([place])    "I think I had a minor one when I had the OHS"   On home oxygen therapy    "7 liters [place] w/oxigenator" (04/03/2017)   Pneumonia 2017   Recurrent Microcytic Anemia    a. presumed chronic GI blood loss.   Type II diabetes mellitus ([place])    Vitamin D deficiency 08/10/2014    SOCIAL HX:  Social History   Tobacco Use   Smoking status: Former Smoker    Packs/day: 2.00    Years: 50.00    Pack years: 100.00    Types: Cigarettes    Quit date: 11/18/2008    Years since quitting: 10.5   Smokeless tobacco: Never Used  Substance Use Topics   Alcohol use: No    Alcohol/week: 0.0 standard drinks    Comment: quit in ~ 2010    ALLERGIES:  Allergies  Allergen Reactions   Morphine And Related Shortness Of Breath, Nausea And Vomiting, Rash and Other (See Comments)    Reaction:  Hallucinations      Penicillins Anaphylaxis, Hives and Other (See Comments)    10/02/18 - discussed with patient and he states he can tolerate penicillin capsules and had some hives when he was 78yo and received pcn injections  Has patient had a PCN reaction causing immediate rash, facial/tongue/throat swelling, SOB or lightheadedness with hypotension: Yes Has patient had a PCN reaction causing severe rash involving mucus membranes or skin necrosis: No Has patient had a PCN reaction that required hospitalization No Has patient had a PCN  reaction occurring within the last 10 y   Zolpidem Shortness Of Breath   Demerol [Meperidine] Other (See Comments)    Reaction:  Hallucinations     Dilaudid [Hydromorphone Hcl] Other (See Comments)    Reaction:  Hallucinations    Levofloxacin Other (See Comments)    Reaction:  Unknown      PERTINENT MEDICATIONS:  Outpatient Encounter Medications as of 05/22/2019  Medication Sig   acetaminophen (TYLENOL) 500 MG tablet Take 1,000 mg by mouth every 6 (six) hours as needed for fever or headache (pain).   albuterol (PROVENTIL HFA;VENTOLIN HFA) 108 (90 BASE) MCG/ACT inhaler Inhale 2 puffs into the lungs every 6 (six) hours as needed for wheezing or shortness of breath.   albuterol (PROVENTIL) (2.5 MG/3ML) 0.083% nebulizer solution Inhale 3 mLs (2.5 mg total) into the lungs every 2 (two) hours as needed for wheezing or shortness of breath. (Patient not taking: Reported on 04/27/2019)   ALPRAZolam (XANAX) 0.25 MG tablet Take 1 tablet (0.25 mg total) by mouth 3 (three) times daily as needed for anxiety or sleep.   atorvastatin (LIPITOR) 10 MG tablet Take 1 tablet (10 mg total) by mouth daily at 6 PM.   citalopram (CELEXA) 40 MG tablet Take 0.5 tablets (20 mg total) by mouth daily. (Patient taking differently: Take 40 mg by mouth daily. )   CVS MELATONIN 5 MG TABS Take 5 mg by mouth at bedtime. For sleep   docusate sodium (COLACE) 100 MG capsule Take 1 capsule (100 mg total)  by mouth 2 (two) times daily.   ferrous sulfate 325 (65 FE) MG tablet Take 1 tablet (325 mg total) by mouth 3 (three) times daily with meals. (Patient not taking: Reported on 04/27/2019)   fluticasone (FLONASE) 50 MCG/ACT nasal spray Place 2 sprays into both nostrils daily. (Patient taking differently: Place 2 sprays into both nostrils daily as needed for allergies or rhinitis. )   furosemide (LASIX) 20 MG tablet Take 3 tablets (60 mg total) by mouth daily. (Patient taking differently: Take 40 mg by mouth 3 (three) times daily as needed for fluid (leg swelling). )   gabapentin (NEURONTIN) 300 MG capsule Take 2 capsules (600 mg total) by mouth 3 (three) times daily. (Patient taking differently: Take 600 mg by mouth 4 (four) times daily. )   insulin aspart (NOVOLOG) 100 UNIT/ML injection Inject 25 Units into the skin 3 (three) times daily with meals.   insulin glargine (LANTUS) 100 UNIT/ML injection Inject 0.35 mLs (35 Units total) into the skin 2 (two) times daily.   ipratropium-albuterol (DUONEB) 0.5-2.5 (3) MG/3ML SOLN He is used 3 times daily for next 5 days, then 3 times daily as needed (Patient not taking: Reported on 04/27/2019)   lisinopril (ZESTRIL) 2.5 MG tablet Take 1 tablet (2.5 mg total) by mouth daily.   loratadine (CLARITIN) 10 MG tablet Take 1 tablet (10 mg total) by mouth daily.   metoprolol tartrate (LOPRESSOR) 25 MG tablet Take 0.5 tablets (12.5 mg total) by mouth 2 (two) times daily. (Patient taking differently: Take 25 mg by mouth daily. )   mometasone-formoterol (DULERA) 200-5 MCG/ACT AERO Inhale 2 puffs into the lungs 2 (two) times daily as needed for wheezing or shortness of breath.   pantoprazole (PROTONIX) 40 MG tablet Take 1 tablet (40 mg total) by mouth 2 (two) times daily.   No facility-administered encounter medications on file as of 05/22/2019.       Aasim Restivo Jenetta Downer, NP

## 2019-06-06 ENCOUNTER — Telehealth: Payer: Self-pay | Admitting: Adult Health Nurse Practitioner

## 2019-06-06 ENCOUNTER — Other Ambulatory Visit: Payer: Self-pay

## 2019-06-06 ENCOUNTER — Other Ambulatory Visit: Payer: Medicare HMO | Admitting: Licensed Clinical Social Worker

## 2019-06-06 DIAGNOSIS — Z515 Encounter for palliative care: Secondary | ICD-10-CM

## 2019-06-06 NOTE — Telephone Encounter (Signed)
Patient called this morning with concerns about getting the care he needs at home. 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  States that she isn't always available to help him and he can get very SOB going from his room to the kitchen to get something to eat or drink.  Discussed possibly getting aides in the home to help.  Spoke with SW to reach out to patient with resources. Amy K. Olena Heckle NP

## 2019-06-06 NOTE — Progress Notes (Signed)
COMMUNITY PALLIATIVE CARE SW NOTE  PATIENT NAME: [name] DOB: 06/23/1941 MRN: [id number]  PRIMARY CARE PROVIDER: [name], MD  RESPONSIBLE PARTY:  Acct ID - Guarantor Home Phone Work Phone Relationship Acct Type  [id number] [name]  Self P/F     [address], [place], [place] [address]   Due to the COVID-19 crisis, this virtual check-in visit was done via telephone from my office and it was initiated and consent given by this patient and or family.  PLAN OF CARE and INTERVENTIONS:             1. GOALS OF CARE/ ADVANCE CARE PLANNING:  Patient's goal is to remain in his home and to not be placed in a nursing home.  He is a full code. 2. SOCIAL/EMOTIONAL/SPIRITUAL ASSESSMENT/ INTERVENTIONS:  SW contacted patient per the request of NP, Amy Olena Heckle.  Patient said his more forgetful.  He lives with his wife, who is the primary caregiver.  SW provided active listening and supportive counseling while he discussed his health concerns.  He responded positively to reminiscing.  SW was able to schedule a visit with the NP on 8/26, at 12. 3. PATIENT/CAREGIVER EDUCATION/ COPING:  SW provided education regarding SW role. 4. PERSONAL EMERGENCY PLAN:  EMS is called. 5. COMMUNITY RESOURCES COORDINATION/ HEALTH CARE NAVIGATION:  PT works with patient twice a week. 6. FINANCIAL/LEGAL CONCERNS/INTERVENTIONS:  None.     SOCIAL HX:  Social History   Tobacco Use  . Smoking status: Former Smoker    Packs/day: 2.00    Years: 50.00    Pack years: 100.00    Types: Cigarettes    Quit date: 11/18/2008    Years since quitting: 10.5  . Smokeless tobacco: Never Used  Substance Use Topics  . Alcohol use: No    Alcohol/week: 0.0 standard drinks    Comment: quit in ~ 2010    CODE STATUS:  Full code ADVANCED DIRECTIVES: N MOST FORM COMPLETE:  Y HOSPICE EDUCATION PROVIDED:  N Duration of visit and documentation:  45 minutes.      Creola Corn Titiana Severa, LCSW

## 2019-06-12 ENCOUNTER — Other Ambulatory Visit: Payer: Medicare HMO | Admitting: Licensed Clinical Social Worker

## 2019-06-12 ENCOUNTER — Other Ambulatory Visit: Payer: Medicare HMO | Admitting: Adult Health Nurse Practitioner

## 2019-06-12 DIAGNOSIS — Z515 Encounter for palliative care: Secondary | ICD-10-CM

## 2019-06-12 NOTE — Progress Notes (Signed)
[facility] Consult Note Telephone: [phone]  Fax: [phone]  PATIENT NAME: [name] DOB: [birth year] MRN: [id number]  PRIMARY CARE PROVIDER:   [name], MD  REFERRING PROVIDER:  [name], MD [address] [place],  [place] [address]  RESPONSIBLE PARTY:   Self [phone] Wife, [name] [phone]     RECOMMENDATIONS and PLAN:  1.  Advanced care planning.  Patient is full code.  No changes made today  2. Care support. Today was a joint visit with SW, Jasmine Awe, to discuss resources for patient to help with in home care.  Patient is not eligible for Medicaid. Discussed possibly hiring someone to come into the home to help with his care.  He needs help with his CPap at night and has a vibrating vest for clot prevention that he needs help with getting on and off.  He gets SOB easily due to end stage COPD and has weakness.  He states that his wife controls the finances and will not pay for help in the home.  He does not want [place] to talk to her about finances or hiring anyone.  States that he does not have any family or friends who would come in and help.  Discussed VA assistance and volunteers and he declined.  Asked if he had a church he could ask and he does not belong to any church.   This is a very difficult situation as he does not feel like his wife is taking care of his needs when he needs them taken care of but he is not able financially to hire someone and is unwilling to take volunteer services.  Though volunteer services would not be able to provide the care he needs.  Of note his wife was observed today filling his pill box and calling pharmacy about his medications.  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  3.  End stage COPD.  Patient is supposed to use a CPaP but states that he needs help getting it on.  Offered to reach out to [employer] where he gets his CPaP and supplies to see if a  respiratory therapist could come out and see if there was a way for him to be able to use his CPaP without needing assistance.  He stated that he preferred to have someone help him put it on for him.  Did reach out to RN clinical navigator to see if someone from [employer] could help him.    4.  Weakness.  Patient stated that he fell out of bed twice since our last visit and that his ribs, back and hips were hurting.  Does have some tenderness with palpation to left side and lower back.  Slight ecchymosis noted to left side. States that he was prescribed something but could not remember what. Patient was able to walk to the bathroom unassisted during our visit.  Does state that his legs will give out sometimes while walking and is encouraged to use walker.  Was getting PT but patient states that he fired them due to the therapist not listening to him.    I spent 60 minutes providing this consultation,  from 12:00 to 1:00. More than 50% of the time in this consultation was spent coordinating communication.   HISTORY OF PRESENT ILLNESS:  [name] is a 78 y.o. year old male with multiple medical problems including end stage COPD, CHF, HTN, CAD, DMT2, bipolar. Palliative  Care was asked to help address goals of care.   CODE STATUS: Full Code  PPS: 60% HOSPICE ELIGIBILITY/DIAGNOSIS: TBD  PHYSICAL EXAM:   General: patient lying in bed in NAD Abdomen: soft, nontender, Patient has some tenderness to palpation around left side ribcage with slight ecchymosis from fall Extremities: no edema, no joint deformities Skin: no rashes Neurological: Weakness but otherwise nonfocal     PAST MEDICAL HISTORY:  Past Medical History:  Diagnosis Date  . AAA (abdominal aortic aneurysm) ([place])    a. 12/2008 s/p 7cm, endovascular repair with coiling right hypogastric artery   . Acute Cameron ulcer   . Adenomatous duodenal polyp   . Allergic rhinitis, cause unspecified   . Anxiety   . AVM (arteriovenous  malformation) of colon with hemorrhage   . Bipolar 1 disorder, mixed, moderate ([place]) 04/16/2015  . CAD (coronary artery disease)    a. 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  . Chronic diastolic CHF (congestive heart failure) ([facility])    a. 04/2015 Echo: EF 55-60%, no rwma, Gr 1 DD, mild AI.  Marland Kitchen Complication of anesthesia    "if they sedate me for too long, they have to intubate me; then they can't get me to come out of it" (04/03/2017)  . COPD (chronic obstructive pulmonary disease) ([facility])    a. GOLD stage IV, started home O2. Severe bullous disease of LUL. Prolonged intubation after surgeries due to COPD.  Marland Kitchen Depression with anxiety 01/14/2013  . Diabetes mellitus with complication ([place])   . Diverticulosis   . Duodenal diverticulum   . Duodenal ulcer   . Emphysema of lung ([place])   . Esophagitis   . Essential hypertension 08/18/2009   Qualifier: Diagnosis of  By: Doy Mince LPN, Megan    . GERD (gastroesophageal reflux disease)   . GI bleed requiring more than 4 units of blood in 24 hours, ICU, or surgery    a. Hx bleeding gastric polyps, cecal & sigmoid AVMS s/p APC 03/30/14  . Hiatal hernia    large  . History of blood transfusion    "many many many; related to blood loss; anemia"  . Hyperlipidemia   . Insomnia 08/10/2014  . Leucocytosis 12/04/2013  . Major depressive disorder   . Memory loss   . Morbid obesity ([facility])   . Multiple gastric polyps   . Myocardial infarction ([facility])    "I think I had a minor one when I had the OHS"  . On home oxygen therapy    "7 liters [place] w/oxigenator" (04/03/2017)  . Pneumonia 2017  . Recurrent Microcytic Anemia    a. presumed chronic GI blood loss.  . Type II diabetes mellitus ([place])   . Vitamin D deficiency 08/10/2014    SOCIAL HX:  Social History   Tobacco Use  . Smoking status: Former Smoker    Packs/day: 2.00    Years: 50.00    Pack years: 100.00    Types: Cigarettes    Quit date: 11/18/2008    Years since quitting:  10.5  . Smokeless tobacco: Never Used  Substance Use Topics  . Alcohol use: No    Alcohol/week: 0.0 standard drinks    Comment: quit in ~ 2010    ALLERGIES:  Allergies  Allergen Reactions  . Morphine And Related Shortness Of Breath, Nausea And Vomiting, Rash and Other (See Comments)    Reaction:  Hallucinations   . Penicillins Anaphylaxis, Hives and Other (See Comments)    10/02/18 -  discussed with patient and he states he can tolerate penicillin capsules and had some hives when he was 78yo and received pcn injections  Has patient had a PCN reaction causing immediate rash, facial/tongue/throat swelling, SOB or lightheadedness with hypotension: Yes Has patient had a PCN reaction causing severe rash involving mucus membranes or skin necrosis: No Has patient had a PCN reaction that required hospitalization No Has patient had a PCN reaction occurring within the last 10 y  . Zolpidem Shortness Of Breath  . Demerol [Meperidine] Other (See Comments)    Reaction:  Hallucinations    . Dilaudid [Hydromorphone Hcl] Other (See Comments)    Reaction:  Hallucinations   . Levofloxacin Other (See Comments)    Reaction:  Unknown      PERTINENT MEDICATIONS:  Outpatient Encounter Medications as of 06/12/2019  Medication Sig  . acetaminophen (TYLENOL) 500 MG tablet Take 1,000 mg by mouth every 6 (six) hours as needed for fever or headache (pain).  Marland Kitchen albuterol (PROVENTIL HFA;VENTOLIN HFA) 108 (90 BASE) MCG/ACT inhaler Inhale 2 puffs into the lungs every 6 (six) hours as needed for wheezing or shortness of breath.  Marland Kitchen albuterol (PROVENTIL) (2.5 MG/3ML) 0.083% nebulizer solution Inhale 3 mLs (2.5 mg total) into the lungs every 2 (two) hours as needed for wheezing or shortness of breath. (Patient not taking: Reported on 04/27/2019)  . ALPRAZolam (XANAX) 0.25 MG tablet Take 1 tablet (0.25 mg total) by mouth 3 (three) times daily as needed for anxiety or sleep.  Marland Kitchen atorvastatin (LIPITOR) 10 MG tablet Take 1  tablet (10 mg total) by mouth daily at 6 PM.  . citalopram (CELEXA) 40 MG tablet Take 0.5 tablets (20 mg total) by mouth daily. (Patient taking differently: Take 40 mg by mouth daily. )  . CVS MELATONIN 5 MG TABS Take 5 mg by mouth at bedtime. For sleep  . docusate sodium (COLACE) 100 MG capsule Take 1 capsule (100 mg total) by mouth 2 (two) times daily.  . ferrous sulfate 325 (65 FE) MG tablet Take 1 tablet (325 mg total) by mouth 3 (three) times daily with meals. (Patient not taking: Reported on 04/27/2019)  . fluticasone (FLONASE) 50 MCG/ACT nasal spray Place 2 sprays into both nostrils daily. (Patient taking differently: Place 2 sprays into both nostrils daily as needed for allergies or rhinitis. )  . furosemide (LASIX) 20 MG tablet Take 3 tablets (60 mg total) by mouth daily. (Patient taking differently: Take 40 mg by mouth 3 (three) times daily as needed for fluid (leg swelling). )  . gabapentin (NEURONTIN) 300 MG capsule Take 2 capsules (600 mg total) by mouth 3 (three) times daily. (Patient taking differently: Take 600 mg by mouth 4 (four) times daily. )  . insulin aspart (NOVOLOG) 100 UNIT/ML injection Inject 25 Units into the skin 3 (three) times daily with meals.  . insulin glargine (LANTUS) 100 UNIT/ML injection Inject 0.35 mLs (35 Units total) into the skin 2 (two) times daily.  Marland Kitchen ipratropium-albuterol (DUONEB) 0.5-2.5 (3) MG/3ML SOLN He is used 3 times daily for next 5 days, then 3 times daily as needed (Patient not taking: Reported on 04/27/2019)  . lisinopril (ZESTRIL) 2.5 MG tablet Take 1 tablet (2.5 mg total) by mouth daily.  Marland Kitchen loratadine (CLARITIN) 10 MG tablet Take 1 tablet (10 mg total) by mouth daily.  . metoprolol tartrate (LOPRESSOR) 25 MG tablet Take 0.5 tablets (12.5 mg total) by mouth 2 (two) times daily. (Patient taking differently: Take 25 mg by mouth daily. )  .  mometasone-formoterol (DULERA) 200-5 MCG/ACT AERO Inhale 2 puffs into the lungs 2 (two) times daily as needed for  wheezing or shortness of breath.  . pantoprazole (PROTONIX) 40 MG tablet Take 1 tablet (40 mg total) by mouth 2 (two) times daily.   No facility-administered encounter medications on file as of 06/12/2019.      Connelly Spruell Jenetta Downer, NP

## 2019-06-12 NOTE — Progress Notes (Signed)
COMMUNITY PALLIATIVE CARE SW NOTE  PATIENT NAME: [name] DOB: 11-May-1941 MRN: [id number]  PRIMARY CARE PROVIDER: [name], MD  RESPONSIBLE PARTY:  Acct ID - Guarantor Home Phone Work Phone Relationship Acct Type  [id number] Howard [id number]  Self P/F     [place], [place],  [address]     PLAN OF CARE and INTERVENTIONS:             1. GOALS OF CARE/ ADVANCE CARE PLANNING:  Goal is for patient to remain in his home.  He is a full code and has a MOST form. 2. SOCIAL/EMOTIONAL/SPIRITUAL ASSESSMENT/ INTERVENTIONS:  SW and NP, [name], met with patient in his home.  He was lying in his bed with his O2 on.  Patient stated he fell out of bed last Sunday and continues to have groin pain.  He said he has had diarrhea for the past week with no fever or cough.  SW provided active listening and supportive counseling while patient participated in life review.  This is his third marriage and he has been married to [name] for 27 years.  Observed her filling his pill box and securing other medications from the pharmacy.  Discussed hiring private caregivers to assist with personal care, meals and light housekeeping.  He stated his wife controls their finances and will not hire anyone.  Also discussed possible assistance from the [employer] which he declined.  He stated he did not want a volunteer either. 3. PATIENT/CAREGIVER EDUCATION/ COPING:  Patient copes by expressing his feelings openly. 4. PERSONAL EMERGENCY PLAN:  Patient will rest when he becomes fatigued. 5. COMMUNITY RESOURCES COORDINATION/ HEALTH CARE NAVIGATION:  None. 6. FINANCIAL/LEGAL CONCERNS/INTERVENTIONS:  None.     SOCIAL HX:  Social History   Tobacco Use  . Smoking status: Former Smoker    Packs/day: 2.00    Years: 50.00    Pack years: 100.00    Types: Cigarettes    Quit date: 11/18/2008    Years since quitting: 10.5  . Smokeless tobacco: Never Used  Substance Use Topics  . Alcohol  use: No    Alcohol/week: 0.0 standard drinks    Comment: quit in ~ 2010    CODE STATUS:  Full Code ADVANCED DIRECTIVES: N MOST FORM COMPLETE:  Y HOSPICE EDUCATION PROVIDED: N PPS:   Patient's appetite is normal.  He can ambulate independently. Duration of visit and documentation:  75 minutes.      Howard Corn Adolpho Meenach, LCSW

## 2019-06-13 ENCOUNTER — Other Ambulatory Visit: Payer: Self-pay

## 2019-06-21 ENCOUNTER — Inpatient Hospital Stay (HOSPITAL_COMMUNITY)
Admission: EM | Admit: 2019-06-21 | Discharge: 2019-06-25 | DRG: 190 | Disposition: A | Discharge status: Home Health | Payer: Medicare HMO | Attending: Internal Medicine | Admitting: Internal Medicine

## 2019-06-21 ENCOUNTER — Emergency Department (HOSPITAL_COMMUNITY): Payer: Medicare HMO

## 2019-06-21 ENCOUNTER — Other Ambulatory Visit: Payer: Self-pay

## 2019-06-21 ENCOUNTER — Encounter (HOSPITAL_COMMUNITY): Payer: Self-pay

## 2019-06-21 DIAGNOSIS — Z9981 Dependence on supplemental oxygen: Secondary | ICD-10-CM

## 2019-06-21 DIAGNOSIS — F419 Anxiety disorder, unspecified: Secondary | ICD-10-CM | POA: Diagnosis present

## 2019-06-21 DIAGNOSIS — J9621 Acute and chronic respiratory failure with hypoxia: Secondary | ICD-10-CM | POA: Diagnosis present

## 2019-06-21 DIAGNOSIS — I452 Bifascicular block: Secondary | ICD-10-CM | POA: Diagnosis present

## 2019-06-21 DIAGNOSIS — Y92003 Bedroom of unspecified non-institutional (private) residence as the place of occurrence of the external cause: Secondary | ICD-10-CM

## 2019-06-21 DIAGNOSIS — I25810 Atherosclerosis of coronary artery bypass graft(s) without angina pectoris: Secondary | ICD-10-CM | POA: Diagnosis not present

## 2019-06-21 DIAGNOSIS — I130 Hypertensive heart and chronic kidney disease with heart failure and stage 1 through stage 4 chronic kidney disease, or unspecified chronic kidney disease: Secondary | ICD-10-CM | POA: Diagnosis present

## 2019-06-21 DIAGNOSIS — Z9119 Patient's noncompliance with other medical treatment and regimen: Secondary | ICD-10-CM

## 2019-06-21 DIAGNOSIS — Z87892 Personal history of anaphylaxis: Secondary | ICD-10-CM

## 2019-06-21 DIAGNOSIS — J9602 Acute respiratory failure with hypercapnia: Secondary | ICD-10-CM | POA: Diagnosis not present

## 2019-06-21 DIAGNOSIS — J439 Emphysema, unspecified: Secondary | ICD-10-CM | POA: Diagnosis present

## 2019-06-21 DIAGNOSIS — J441 Chronic obstructive pulmonary disease with (acute) exacerbation: Secondary | ICD-10-CM

## 2019-06-21 DIAGNOSIS — Z87891 Personal history of nicotine dependence: Secondary | ICD-10-CM

## 2019-06-21 DIAGNOSIS — J9622 Acute and chronic respiratory failure with hypercapnia: Secondary | ICD-10-CM | POA: Diagnosis present

## 2019-06-21 DIAGNOSIS — E785 Hyperlipidemia, unspecified: Secondary | ICD-10-CM | POA: Diagnosis present

## 2019-06-21 DIAGNOSIS — K5900 Constipation, unspecified: Secondary | ICD-10-CM | POA: Diagnosis present

## 2019-06-21 DIAGNOSIS — Z833 Family history of diabetes mellitus: Secondary | ICD-10-CM

## 2019-06-21 DIAGNOSIS — E1122 Type 2 diabetes mellitus with diabetic chronic kidney disease: Secondary | ICD-10-CM | POA: Diagnosis present

## 2019-06-21 DIAGNOSIS — Z6836 Body mass index (BMI) 36.0-36.9, adult: Secondary | ICD-10-CM | POA: Diagnosis not present

## 2019-06-21 DIAGNOSIS — Y92009 Unspecified place in unspecified non-institutional (private) residence as the place of occurrence of the external cause: Secondary | ICD-10-CM

## 2019-06-21 DIAGNOSIS — Z880 Allergy status to penicillin: Secondary | ICD-10-CM

## 2019-06-21 DIAGNOSIS — Z825 Family history of asthma and other chronic lower respiratory diseases: Secondary | ICD-10-CM

## 2019-06-21 DIAGNOSIS — G9341 Metabolic encephalopathy: Secondary | ICD-10-CM | POA: Diagnosis present

## 2019-06-21 DIAGNOSIS — I2510 Atherosclerotic heart disease of native coronary artery without angina pectoris: Secondary | ICD-10-CM | POA: Diagnosis present

## 2019-06-21 DIAGNOSIS — K449 Diaphragmatic hernia without obstruction or gangrene: Secondary | ICD-10-CM | POA: Diagnosis present

## 2019-06-21 DIAGNOSIS — W19XXXA Unspecified fall, initial encounter: Secondary | ICD-10-CM | POA: Diagnosis not present

## 2019-06-21 DIAGNOSIS — R413 Other amnesia: Secondary | ICD-10-CM | POA: Diagnosis present

## 2019-06-21 DIAGNOSIS — E1165 Type 2 diabetes mellitus with hyperglycemia: Secondary | ICD-10-CM | POA: Diagnosis not present

## 2019-06-21 DIAGNOSIS — R739 Hyperglycemia, unspecified: Secondary | ICD-10-CM | POA: Diagnosis present

## 2019-06-21 DIAGNOSIS — J9600 Acute respiratory failure, unspecified whether with hypoxia or hypercapnia: Secondary | ICD-10-CM | POA: Diagnosis present

## 2019-06-21 DIAGNOSIS — K219 Gastro-esophageal reflux disease without esophagitis: Secondary | ICD-10-CM | POA: Diagnosis present

## 2019-06-21 DIAGNOSIS — I252 Old myocardial infarction: Secondary | ICD-10-CM

## 2019-06-21 DIAGNOSIS — R509 Fever, unspecified: Secondary | ICD-10-CM

## 2019-06-21 DIAGNOSIS — Z95820 Peripheral vascular angioplasty status with implants and grafts: Secondary | ICD-10-CM

## 2019-06-21 DIAGNOSIS — Z79890 Hormone replacement therapy (postmenopausal): Secondary | ICD-10-CM

## 2019-06-21 DIAGNOSIS — Z20828 Contact with and (suspected) exposure to other viral communicable diseases: Secondary | ICD-10-CM | POA: Diagnosis present

## 2019-06-21 DIAGNOSIS — N183 Chronic kidney disease, stage 3 (moderate): Secondary | ICD-10-CM

## 2019-06-21 DIAGNOSIS — Z794 Long term (current) use of insulin: Secondary | ICD-10-CM

## 2019-06-21 DIAGNOSIS — Z951 Presence of aortocoronary bypass graft: Secondary | ICD-10-CM

## 2019-06-21 DIAGNOSIS — Z8711 Personal history of peptic ulcer disease: Secondary | ICD-10-CM

## 2019-06-21 DIAGNOSIS — Z8249 Family history of ischemic heart disease and other diseases of the circulatory system: Secondary | ICD-10-CM

## 2019-06-21 DIAGNOSIS — R059 Cough, unspecified: Secondary | ICD-10-CM

## 2019-06-21 DIAGNOSIS — I5032 Chronic diastolic (congestive) heart failure: Secondary | ICD-10-CM | POA: Diagnosis present

## 2019-06-21 DIAGNOSIS — Z885 Allergy status to narcotic agent status: Secondary | ICD-10-CM | POA: Diagnosis not present

## 2019-06-21 DIAGNOSIS — R05 Cough: Secondary | ICD-10-CM

## 2019-06-21 DIAGNOSIS — Z79899 Other long term (current) drug therapy: Secondary | ICD-10-CM

## 2019-06-21 DIAGNOSIS — Z888 Allergy status to other drugs, medicaments and biological substances status: Secondary | ICD-10-CM | POA: Diagnosis not present

## 2019-06-21 DIAGNOSIS — T380X5A Adverse effect of glucocorticoids and synthetic analogues, initial encounter: Secondary | ICD-10-CM | POA: Diagnosis not present

## 2019-06-21 LAB — COMPREHENSIVE METABOLIC PANEL
ALT: 12 U/L (ref 0–44)
AST: 13 U/L — ABNORMAL LOW (ref 15–41)
Albumin: 3.8 g/dL (ref 3.5–5.0)
Alkaline Phosphatase: 147 U/L — ABNORMAL HIGH (ref 38–126)
Anion gap: 10 (ref 5–15)
BUN: 21 mg/dL (ref 8–23)
CO2: 36 mmol/L — ABNORMAL HIGH (ref 22–32)
Calcium: 8.4 mg/dL — ABNORMAL LOW (ref 8.9–10.3)
Chloride: 90 mmol/L — ABNORMAL LOW (ref 98–111)
Creatinine, Ser: 1.36 mg/dL — ABNORMAL HIGH (ref 0.61–1.24)
GFR calc Af Amer: 58 mL/min — ABNORMAL LOW (ref >60)
GFR calc non Af Amer: 50 mL/min — ABNORMAL LOW (ref >60)
Glucose, Bld: 250 mg/dL — ABNORMAL HIGH (ref 70–99)
Potassium: 4.5 mmol/L (ref 3.5–5.1)
Sodium: 136 mmol/L (ref 135–145)
Total Bilirubin: 0.6 mg/dL (ref 0.3–1.2)
Total Protein: 7.6 g/dL (ref 6.5–8.1)

## 2019-06-21 LAB — CBC WITH DIFFERENTIAL/PLATELET
Abs Immature Granulocytes: 0.3 10*3/uL — ABNORMAL HIGH (ref 0.00–0.07)
Basophils Absolute: 0.1 10*3/uL (ref 0.0–0.1)
Basophils Relative: 1 %
Eosinophils Absolute: 0.2 10*3/uL (ref 0.0–0.5)
Eosinophils Relative: 2 %
HCT: 34.3 % — ABNORMAL LOW (ref 39.0–52.0)
Hemoglobin: 9.8 g/dL — ABNORMAL LOW (ref 13.0–17.0)
Immature Granulocytes: 3 %
Lymphocytes Relative: 11 %
Lymphs Abs: 1.1 10*3/uL (ref 0.7–4.0)
MCH: 26.8 pg (ref 26.0–34.0)
MCHC: 28.6 g/dL — ABNORMAL LOW (ref 30.0–36.0)
MCV: 94 fL (ref 80.0–100.0)
Monocytes Absolute: 0.9 10*3/uL (ref 0.1–1.0)
Monocytes Relative: 9 %
Neutro Abs: 7.8 10*3/uL — ABNORMAL HIGH (ref 1.7–7.7)
Neutrophils Relative %: 74 %
Platelets: 199 10*3/uL (ref 150–400)
RBC: 3.65 MIL/uL — ABNORMAL LOW (ref 4.22–5.81)
RDW: 14.8 % (ref 11.5–15.5)
WBC: 10.4 10*3/uL (ref 4.0–10.5)
nRBC: 0 % (ref 0.0–0.2)

## 2019-06-21 LAB — BLOOD GAS, ARTERIAL
Acid-Base Excess: 8.7 mmol/L — ABNORMAL HIGH (ref 0.0–2.0)
Acid-Base Excess: 9.9 mmol/L — ABNORMAL HIGH (ref 0.0–2.0)
Bicarbonate: 37.3 mmol/L — ABNORMAL HIGH (ref 20.0–28.0)
Bicarbonate: 38 mmol/L — ABNORMAL HIGH (ref 20.0–28.0)
Delivery systems: POSITIVE
Drawn by: 225631
Drawn by: 103701
Expiratory PAP: 5
FIO2: 40
Inspiratory PAP: 15
O2 Content: 6 L/min
O2 Saturation: 99.3 %
O2 Saturation: 95.1 %
Patient temperature: 98.6
Patient temperature: 98.6
RATE: 8 resp/min
pCO2 arterial: 83.2 mmHg (ref 32.0–48.0)
pCO2 arterial: 78.9 mmHg (ref 32.0–48.0)
pH, Arterial: 7.274 — ABNORMAL LOW (ref 7.350–7.450)
pH, Arterial: 7.304 — ABNORMAL LOW (ref 7.350–7.450)
pO2, Arterial: 174 mmHg — ABNORMAL HIGH (ref 83.0–108.0)
pO2, Arterial: 80.3 mmHg — ABNORMAL LOW (ref 83.0–108.0)

## 2019-06-21 LAB — LACTIC ACID, PLASMA: Lactic Acid, Venous: 1.2 mmol/L (ref 0.5–1.9)

## 2019-06-21 LAB — GLUCOSE, CAPILLARY
Glucose-Capillary: 131 mg/dL — ABNORMAL HIGH (ref 70–99)
Glucose-Capillary: 266 mg/dL — ABNORMAL HIGH (ref 70–99)
Glucose-Capillary: 338 mg/dL — ABNORMAL HIGH (ref 70–99)

## 2019-06-21 LAB — URINALYSIS, COMPLETE (UACMP) WITH MICROSCOPIC
Bilirubin Urine: NEGATIVE
Glucose, UA: NEGATIVE mg/dL
Hgb urine dipstick: NEGATIVE
Ketones, ur: NEGATIVE mg/dL
Nitrite: NEGATIVE
Protein, ur: 30 mg/dL — AB
Specific Gravity, Urine: 1.017 (ref 1.005–1.030)
pH: 5 (ref 5.0–8.0)

## 2019-06-21 LAB — PROCALCITONIN: Procalcitonin: <0.1 ng/mL

## 2019-06-21 LAB — IRON AND TIBC
Iron: 63 ug/dL (ref 45–182)
Saturation Ratios: 14 % — ABNORMAL LOW (ref 17.9–39.5)
TIBC: 449 ug/dL (ref 250–450)
UIBC: 386 ug/dL

## 2019-06-21 LAB — FERRITIN: Ferritin: 20 ng/mL — ABNORMAL LOW (ref 24–336)

## 2019-06-21 LAB — BRAIN NATRIURETIC PEPTIDE: B Natriuretic Peptide: 87.9 pg/mL (ref 0.0–100.0)

## 2019-06-21 LAB — RETICULOCYTES
Immature Retic Fract: 27.6 % — ABNORMAL HIGH (ref 2.3–15.9)
RBC.: 3.64 MIL/uL — ABNORMAL LOW (ref 4.22–5.81)
Retic Count, Absolute: 105.6 10*3/uL (ref 19.0–186.0)
Retic Ct Pct: 2.9 % (ref 0.4–3.1)

## 2019-06-21 LAB — TROPONIN I (HIGH SENSITIVITY)
Troponin I (High Sensitivity): 11 ng/L (ref <18)
Troponin I (High Sensitivity): 11 ng/L (ref <18)

## 2019-06-21 LAB — VITAMIN B12: Vitamin B-12: 506 pg/mL (ref 180–914)

## 2019-06-21 LAB — CBG MONITORING, ED: Glucose-Capillary: 253 mg/dL — ABNORMAL HIGH (ref 70–99)

## 2019-06-21 LAB — FOLATE: Folate: 9.1 ng/mL (ref >5.9)

## 2019-06-21 LAB — TSH: TSH: 3.108 u[IU]/mL (ref 0.350–4.500)

## 2019-06-21 LAB — HEMOGLOBIN A1C
Hgb A1c MFr Bld: 6.5 % — ABNORMAL HIGH (ref 4.8–5.6)
Mean Plasma Glucose: 139.85 mg/dL

## 2019-06-21 LAB — SARS CORONAVIRUS 2 BY RT PCR (HOSPITAL ORDER, PERFORMED IN ~~LOC~~ HOSPITAL LAB): SARS Coronavirus 2: NEGATIVE

## 2019-06-21 MED ORDER — ACETAMINOPHEN 325 MG PO TABS
650.0000 mg | ORAL_TABLET | Freq: Four times a day (QID) | ORAL | Status: DC | PRN
  Administered 2019-06-22 – 2019-06-23 (×2): 650 mg via ORAL
  Filled 2019-06-21 (×3): qty 2

## 2019-06-21 MED ORDER — ACETAMINOPHEN 650 MG RE SUPP
650.0000 mg | Freq: Once | RECTAL | Status: AC
  Administered 2019-06-21: 05:00:00 650 mg via RECTAL
  Filled 2019-06-21: qty 1

## 2019-06-21 MED ORDER — PANTOPRAZOLE SODIUM 40 MG PO TBEC
40.0000 mg | DELAYED_RELEASE_TABLET | Freq: Two times a day (BID) | ORAL | Status: DC
  Administered 2019-06-21 – 2019-06-25 (×9): 40 mg via ORAL
  Filled 2019-06-21 (×9): qty 1

## 2019-06-21 MED ORDER — INSULIN ASPART 100 UNIT/ML ~~LOC~~ SOLN
0.0000 [IU] | Freq: Every day | SUBCUTANEOUS | Status: DC
  Administered 2019-06-21 – 2019-06-22 (×2): 4 [IU] via SUBCUTANEOUS
  Administered 2019-06-23: 3 [IU] via SUBCUTANEOUS
  Filled 2019-06-21: qty 0.05

## 2019-06-21 MED ORDER — BUDESONIDE 0.5 MG/2ML IN SUSP
0.5000 mg | Freq: Two times a day (BID) | RESPIRATORY_TRACT | Status: DC
  Administered 2019-06-21 – 2019-06-25 (×8): 0.5 mg via RESPIRATORY_TRACT
  Filled 2019-06-21 (×8): qty 2

## 2019-06-21 MED ORDER — METOPROLOL TARTRATE 12.5 MG HALF TABLET
12.5000 mg | ORAL_TABLET | Freq: Two times a day (BID) | ORAL | Status: DC
  Administered 2019-06-21 – 2019-06-25 (×9): 12.5 mg via ORAL
  Filled 2019-06-21 (×9): qty 1

## 2019-06-21 MED ORDER — VANCOMYCIN HCL 10 G IV SOLR
2000.0000 mg | Freq: Once | INTRAVENOUS | Status: AC
  Administered 2019-06-21: 2000 mg via INTRAVENOUS
  Filled 2019-06-21: qty 2000

## 2019-06-21 MED ORDER — METRONIDAZOLE IN NACL 5-0.79 MG/ML-% IV SOLN
500.0000 mg | Freq: Once | INTRAVENOUS | Status: AC
  Administered 2019-06-21: 06:00:00 500 mg via INTRAVENOUS
  Filled 2019-06-21: qty 100

## 2019-06-21 MED ORDER — METHYLPREDNISOLONE SODIUM SUCC 125 MG IJ SOLR
60.0000 mg | Freq: Two times a day (BID) | INTRAMUSCULAR | Status: DC
  Administered 2019-06-21 – 2019-06-23 (×4): 60 mg via INTRAVENOUS
  Filled 2019-06-21 (×4): qty 2

## 2019-06-21 MED ORDER — LACTATED RINGERS IV BOLUS (SEPSIS): 1000.0000 mL | Freq: Once | INTRAVENOUS | Status: DC

## 2019-06-21 MED ORDER — ACETAMINOPHEN 650 MG RE SUPP: 650.0000 mg | Freq: Four times a day (QID) | RECTAL | Status: DC | PRN

## 2019-06-21 MED ORDER — CITALOPRAM HYDROBROMIDE 20 MG PO TABS
20.0000 mg | ORAL_TABLET | Freq: Every day | ORAL | Status: DC
  Administered 2019-06-21 – 2019-06-25 (×5): 20 mg via ORAL
  Filled 2019-06-21: qty 1
  Filled 2019-06-21 (×4): qty 2

## 2019-06-21 MED ORDER — FUROSEMIDE 40 MG PO TABS
60.0000 mg | ORAL_TABLET | Freq: Every day | ORAL | Status: DC
  Administered 2019-06-21 – 2019-06-22 (×2): 60 mg via ORAL
  Filled 2019-06-21 (×2): qty 1

## 2019-06-21 MED ORDER — ONDANSETRON HCL 4 MG/2ML IJ SOLN: 4.0000 mg | Freq: Four times a day (QID) | INTRAMUSCULAR | Status: DC | PRN

## 2019-06-21 MED ORDER — METHYLPREDNISOLONE SODIUM SUCC 125 MG IJ SOLR
125.0000 mg | Freq: Once | INTRAMUSCULAR | Status: AC
  Administered 2019-06-21: 125 mg via INTRAVENOUS
  Filled 2019-06-21: qty 2

## 2019-06-21 MED ORDER — IPRATROPIUM-ALBUTEROL 0.5-2.5 (3) MG/3ML IN SOLN
3.0000 mL | Freq: Four times a day (QID) | RESPIRATORY_TRACT | Status: DC
  Administered 2019-06-21 – 2019-06-23 (×9): 3 mL via RESPIRATORY_TRACT
  Filled 2019-06-21 (×11): qty 3

## 2019-06-21 MED ORDER — GABAPENTIN 300 MG PO CAPS
600.0000 mg | ORAL_CAPSULE | Freq: Three times a day (TID) | ORAL | Status: DC
  Administered 2019-06-21 – 2019-06-25 (×12): 600 mg via ORAL
  Filled 2019-06-21 (×12): qty 2

## 2019-06-21 MED ORDER — ATORVASTATIN CALCIUM 10 MG PO TABS
10.0000 mg | ORAL_TABLET | Freq: Every day | ORAL | Status: DC
  Administered 2019-06-21 – 2019-06-24 (×4): 10 mg via ORAL
  Filled 2019-06-21 (×4): qty 1

## 2019-06-21 MED ORDER — CHLORHEXIDINE GLUCONATE CLOTH 2 % EX PADS
6.0000 | MEDICATED_PAD | Freq: Every day | CUTANEOUS | Status: DC
  Administered 2019-06-21 – 2019-06-23 (×3): 6 via TOPICAL

## 2019-06-21 MED ORDER — VANCOMYCIN HCL IN DEXTROSE 1-5 GM/200ML-% IV SOLN: 1000.0000 mg | Freq: Once | INTRAVENOUS | Status: DC

## 2019-06-21 MED ORDER — ONDANSETRON HCL 4 MG PO TABS: 4.0000 mg | ORAL_TABLET | Freq: Four times a day (QID) | ORAL | Status: DC | PRN

## 2019-06-21 MED ORDER — SODIUM CHLORIDE 0.9 % IV SOLN
500.0000 mg | INTRAVENOUS | Status: DC
  Administered 2019-06-21 – 2019-06-22 (×2): 500 mg via INTRAVENOUS
  Filled 2019-06-21 (×2): qty 500

## 2019-06-21 MED ORDER — SENNOSIDES-DOCUSATE SODIUM 8.6-50 MG PO TABS
1.0000 | ORAL_TABLET | Freq: Every evening | ORAL | Status: DC | PRN
  Administered 2019-06-21: 1 via ORAL
  Filled 2019-06-21: qty 1

## 2019-06-21 MED ORDER — INSULIN ASPART 100 UNIT/ML ~~LOC~~ SOLN
10.0000 [IU] | Freq: Three times a day (TID) | SUBCUTANEOUS | Status: DC
  Administered 2019-06-21 – 2019-06-22 (×3): 10 [IU] via SUBCUTANEOUS
  Filled 2019-06-21: qty 0.1

## 2019-06-21 MED ORDER — SODIUM CHLORIDE 0.9 % IV SOLN
2.0000 g | Freq: Once | INTRAVENOUS | Status: AC
  Administered 2019-06-21: 2 g via INTRAVENOUS
  Filled 2019-06-21: qty 2

## 2019-06-21 MED ORDER — SODIUM CHLORIDE 0.9 % IV SOLN: 2.0000 g | Freq: Once | INTRAVENOUS | Status: DC

## 2019-06-21 MED ORDER — ALBUTEROL SULFATE HFA 108 (90 BASE) MCG/ACT IN AERS
8.0000 | INHALATION_SPRAY | Freq: Once | RESPIRATORY_TRACT | Status: AC
  Administered 2019-06-21: 8 via RESPIRATORY_TRACT
  Filled 2019-06-21: qty 6.7

## 2019-06-21 MED ORDER — ENOXAPARIN SODIUM 60 MG/0.6ML ~~LOC~~ SOLN
60.0000 mg | SUBCUTANEOUS | Status: DC
  Administered 2019-06-21 – 2019-06-25 (×5): 60 mg via SUBCUTANEOUS
  Filled 2019-06-21 (×6): qty 0.6

## 2019-06-21 MED ORDER — ENOXAPARIN SODIUM 40 MG/0.4ML ~~LOC~~ SOLN: 40.0000 mg | SUBCUTANEOUS | Status: DC

## 2019-06-21 MED ORDER — ALBUTEROL SULFATE (2.5 MG/3ML) 0.083% IN NEBU: 2.5000 mg | INHALATION_SOLUTION | RESPIRATORY_TRACT | Status: DC | PRN

## 2019-06-21 MED ORDER — ALPRAZOLAM 0.25 MG PO TABS
0.2500 mg | ORAL_TABLET | Freq: Three times a day (TID) | ORAL | Status: DC | PRN
  Administered 2019-06-21 – 2019-06-25 (×5): 0.25 mg via ORAL
  Filled 2019-06-21 (×6): qty 1

## 2019-06-21 MED ORDER — SODIUM CHLORIDE 0.9 % IV SOLN
2.0000 g | INTRAVENOUS | Status: DC
  Administered 2019-06-21 – 2019-06-22 (×2): 2 g via INTRAVENOUS
  Filled 2019-06-21 (×2): qty 2

## 2019-06-21 MED ORDER — ALBUTEROL SULFATE (2.5 MG/3ML) 0.083% IN NEBU
5.0000 mg | INHALATION_SOLUTION | Freq: Once | RESPIRATORY_TRACT | Status: AC
  Administered 2019-06-21: 5 mg via RESPIRATORY_TRACT
  Filled 2019-06-21: qty 6

## 2019-06-21 MED ORDER — DOCUSATE SODIUM 100 MG PO CAPS
100.0000 mg | ORAL_CAPSULE | Freq: Two times a day (BID) | ORAL | Status: DC
  Administered 2019-06-21 – 2019-06-25 (×7): 100 mg via ORAL
  Filled 2019-06-21 (×8): qty 1

## 2019-06-21 MED ORDER — INSULIN GLARGINE 100 UNIT/ML ~~LOC~~ SOLN
25.0000 [IU] | Freq: Two times a day (BID) | SUBCUTANEOUS | Status: DC
  Administered 2019-06-21 – 2019-06-22 (×2): 25 [IU] via SUBCUTANEOUS
  Filled 2019-06-21 (×4): qty 0.25

## 2019-06-21 MED ORDER — IPRATROPIUM BROMIDE 0.02 % IN SOLN
0.5000 mg | Freq: Once | RESPIRATORY_TRACT | Status: AC
  Administered 2019-06-21: 0.5 mg via RESPIRATORY_TRACT
  Filled 2019-06-21: qty 2.5

## 2019-06-21 MED ORDER — INSULIN ASPART 100 UNIT/ML ~~LOC~~ SOLN
0.0000 [IU] | Freq: Three times a day (TID) | SUBCUTANEOUS | Status: DC
  Administered 2019-06-21: 13:00:00 2 [IU] via SUBCUTANEOUS
  Administered 2019-06-21: 19:00:00 8 [IU] via SUBCUTANEOUS
  Administered 2019-06-22: 11 [IU] via SUBCUTANEOUS
  Administered 2019-06-22 (×2): 15 [IU] via SUBCUTANEOUS
  Administered 2019-06-23: 09:00:00 8 [IU] via SUBCUTANEOUS
  Administered 2019-06-23 (×2): 11 [IU] via SUBCUTANEOUS
  Administered 2019-06-24: 17:00:00 2 [IU] via SUBCUTANEOUS
  Administered 2019-06-24: 3 [IU] via SUBCUTANEOUS
  Administered 2019-06-24 – 2019-06-25 (×2): 5 [IU] via SUBCUTANEOUS
  Administered 2019-06-25: 08:00:00 3 [IU] via SUBCUTANEOUS
  Filled 2019-06-21: qty 0.15

## 2019-06-21 MED ORDER — GABAPENTIN 300 MG PO CAPS: 300.0000 mg | ORAL_CAPSULE | Freq: Every day | ORAL | Status: DC
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
## 2019-06-21 NOTE — ED Triage Notes (Signed)
Pt BIB [facility] from home. Pt fell around 0200 due to increased weakness and legs giving out on him. Pt was able to get back into the bed and call EMS. PT c/o generalized pain all over, denies LOC or blood thinners. Pt also states he has experienced increased SHOB over the last 10 days. Pt is on 6L of O2, which is normal. Pt states he is also, "seeing big pink bulls on the ceiling." Pt is A&O x4, struggled initially with what year it was, but was able to tell the year.   Pt denies COVID contact.

## 2019-06-21 NOTE — Progress Notes (Signed)
A consult was received from an ED physician for cefepime and vancomycin per pharmacy dosing.  The patient's profile has been reviewed for ht/wt/allergies/indication/available labs.   A one time order has been placed for a cefepime 2 Gm and vancomycin 2 Gm .  Further antibiotics/pharmacy consults should be ordered by admitting physician if indicated.       Azactam was ordered d/t patient PCN allergy = anaphylaxis/hives. Patient has tolerated rocephin, cefpodoxime in the past. Per pharmacy consult I can change azactam to cefepime if tolerated cephalosporins.                  Thank you, Dorrene German 06/21/2019  4:45 AM

## 2019-06-21 NOTE — Progress Notes (Signed)
Patient currently off BiPAP.   

## 2019-06-21 NOTE — ED Notes (Signed)
Holding LR due to incompatibility with medications. 1 IV access.   Per Dr. Christy Gentles, [facility] can be held due to normal lactate value.

## 2019-06-21 NOTE — ED Notes (Signed)
ED TO INPATIENT HANDOFF REPORT  Name/Age/Gender [name] 78 y.o. male  Code Status Code Status History    Date Active Date Inactive Code Status Order ID Comments User Context   05/05/2019 1137 05/06/2019 2332 Full Code [id number]  Caren Griffins, MD Inpatient   04/27/2019 2033 04/28/2019 1819 Full Code [id number]  Lenore Cordia, MD ED   04/07/2019 1229 04/13/2019 1645 DNR [id number]  Wilhelmina Mcardle, MD Inpatient   04/06/2019 1048 04/07/2019 1229 Full Code [id number]  Lang Snow, NP ED   02/28/2019 0924 03/05/2019 2017 Full Code [id number]  Deatra James, MD ED   12/26/2018 1059 12/31/2018 2100 Full Code [id number]  Nita Sells, MD Inpatient   10/11/2018 2353 10/14/2018 1912 Full Code [id number]  Etta Quill, DO ED   10/02/2018 0309 10/06/2018 1741 Full Code [id number]  Vianne Bulls, MD ED   02/08/2018 1042 02/11/2018 1624 Full Code [id number]  Velvet Bathe, MD Inpatient   01/28/2018 0641 01/30/2018 2132 Full Code [id number]  Arta Silence, MD Inpatient   01/18/2018 2225 01/20/2018 2210 Full Code [id number]  Lance Coon, MD Inpatient   06/28/2017 1203 06/30/2017 1554 Full Code [id number]  Rondel Jumbo, PA-C ED   05/08/2017 1341 05/11/2017 2127 Full Code [id number]  Waldemar Dickens, MD ED   04/03/2017 1715 04/10/2017 1745 Full Code [id number]  Waldemar Dickens, MD ED   03/30/2017 1927 03/31/2017 1928 Full Code [id number]  Bonnielee Haff, MD Inpatient   02/22/2017 1614 02/24/2017 1829 Full Code [id number]  Fritzi Mandes, MD Inpatient   11/07/2016 1020 11/09/2016 1825 Full Code [id number]  Saundra Shelling, MD Inpatient   10/21/2016 0414 10/25/2016 1503 Full Code [id number]  [place], [place], DO Inpatient   10/09/2016 1152 10/12/2016 1649 Full Code [id number]  Lavetta Nielsen, Aaron Mose, MD ED   10/09/2016 1105 10/09/2016 1152 DNR [id number]  Lavetta Nielsen, Aaron Mose, MD ED   07/24/2016 0806 07/27/2016 1759 DNR [id number]  Harrie Foreman, MD Inpatient   02/10/2016 1528 02/12/2016 2009 DNR [id number]   Loletha Grayer, MD ED   10/26/2015 0314 11/01/2015 2005 Full Code [id number]  Saundra Shelling, MD Inpatient   08/20/2015 0441 08/22/2015 1649 Full Code [id number]  Lytle Butte, MD ED   08/20/2015 0254 08/20/2015 0441 Full Code [id number]  Lytle Butte, MD ED   07/27/2015 2040 07/29/2015 1829 Full Code [id number]  Lytle Butte, MD ED   06/30/2015 1240 07/03/2015 1526 Full Code [id number]  Bettey Costa, MD Inpatient   05/19/2015 1459 05/23/2015 1621 Full Code [id number]  Max Sane, MD Inpatient   05/06/2015 1714 05/13/2015 1706 Full Code [id number]  Epifanio Lesches, MD ED   05/03/2015 1229 05/04/2015 1920 Full Code [id number]  Idelle Crouch, MD Inpatient   04/22/2015 0450 04/27/2015 1935 Full Code [id number]  Harrie Foreman, MD Inpatient   04/17/2015 1916 04/19/2015 1716 Full Code [id number]  Clovis Fredrickson, MD Inpatient   04/10/2015 0912 04/17/2015 1908 Full Code [id number]  Demetrios Loll, MD Inpatient   03/09/2015 1218 03/12/2015 1845 Full Code [id number]  Henreitta Leber, MD Inpatient   08/24/2014 2141 08/25/2014 1720 Full Code [id number]  Ivor Costa, MD ED   04/02/2014 2011 04/04/2014 1710 Full Code [id number]  Velvet Bathe, MD Inpatient   03/18/2014 1544 03/22/2014 1406 Full Code [id number]  Verlee Monte, MD Inpatient   12/04/2013 0613 12/10/2013 1954 Full Code [id number]  Rise Patience, MD Inpatient   10/15/2013 2011 10/18/2013 1840 Full Code [id number]  Fuller Plan, MD Inpatient  09/08/2013 0043 09/10/2013 1530 Full Code [id number]  Toy Baker, MD Inpatient   08/19/2013 1748 08/22/2013 1551 Full Code [id number]  Orson Eva, MD ED   05/15/2013 0126 05/17/2013 1659 Full Code [id number]  Theodis Blaze, MD Inpatient   12/04/2012 1723 12/08/2012 1656 Full Code [id number]  Janece Canterbury, MD Inpatient   05/17/2012 1256 05/21/2012 1809 Full Code [id number]  Campbell Lerner, RN ED   04/06/2012 1935 04/14/2012 1723 Full Code [id number]  Marylou Mccoy, RN Inpatient   03/27/2012 0444 03/28/2012 1756 Full Code [id number]   Theotis Barrio, RN Inpatient   Advance Care Planning Activity      Home/SNF/Other Home  Chief Complaint Weakness;Fall;Shortness of Breath  Level of Care/Admitting Diagnosis ED Disposition    ED Disposition Condition [place]: [facility] [[id number]]  Level of Care: Stepdown [14]  Admit to SDU based on following criteria: Respiratory Distress:  Frequent assessment and/or intervention to maintain adequate ventilation/respiration, pulmonary toilet, and respiratory treatment.  Covid Evaluation: Confirmed COVID Negative  Diagnosis: Acute respiratory failure ([place]) [518.81.ICD-9-CM]  Admitting Physician: Mercy Riding [id number]  Attending Physician: Mercy Riding [id number]  Estimated length of stay: past midnight tomorrow  Certification:: I certify this patient will need inpatient services for at least 2 midnights  PT Class (Do Not Modify): Inpatient [101]  PT Acc Code (Do Not Modify): Private [1]       Medical History Past Medical History:  Diagnosis Date  . AAA (abdominal aortic aneurysm) ([place])    a. 12/2008 s/p 7cm, endovascular repair with coiling right hypogastric artery   . Acute [place] ulcer   . Adenomatous duodenal polyp   . Allergic rhinitis, cause unspecified   . Anxiety   . AVM (arteriovenous malformation) of colon with hemorrhage   . Bipolar 1 disorder, mixed, moderate ([place]) 04/16/2015  . CAD (coronary artery disease)    a. 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  . Chronic diastolic CHF (congestive heart failure) ([place])    a. 04/2015 Echo: EF 55-60%, no rwma, Gr 1 DD, mild AI.  Marland Kitchen Complication of anesthesia    "if they sedate me for too long, they have to intubate me; then they can't get me to come out of it" (04/03/2017)  . COPD (chronic obstructive pulmonary disease) ([place])    a. GOLD stage IV, started home O2. Severe bullous disease of LUL. Prolonged intubation after surgeries due to COPD.  Marland Kitchen  Depression with anxiety 01/14/2013  . Diabetes mellitus with complication ([place])   . Diverticulosis   . Duodenal diverticulum   . Duodenal ulcer   . Emphysema of lung ([place])   . Esophagitis   . Essential hypertension 08/18/2009   Qualifier: Diagnosis of  By: Doy Mince LPN, Megan    . GERD (gastroesophageal reflux disease)   . GI bleed requiring more than 4 units of blood in 24 hours, ICU, or surgery    a. Hx bleeding gastric polyps, cecal & sigmoid AVMS s/p APC 03/30/14  . Hiatal hernia    large  . History of blood transfusion    "many many many; related to blood loss; anemia"  . Hyperlipidemia   . Insomnia 08/10/2014  . Leucocytosis 12/04/2013  . Major depressive disorder   . Memory loss   . Morbid obesity ([place])   . Multiple gastric polyps   . Myocardial infarction [facility])    "I think I had a minor one when I had  the OHS"  . On home oxygen therapy    "7 liters [place] w/oxigenator" (04/03/2017)  . Pneumonia 2017  . Recurrent Microcytic Anemia    a. presumed chronic GI blood loss.  . Type II diabetes mellitus ([place])   . Vitamin D deficiency 08/10/2014    Allergies Allergies  Allergen Reactions  . Morphine And Related Shortness Of Breath, Nausea And Vomiting, Rash and Other (See Comments)    Reaction:  Hallucinations   . Penicillins Anaphylaxis, Hives and Other (See Comments)    10/02/18 - discussed with patient and he states he can tolerate penicillin capsules and had some hives when he was 78yo and received pcn injections  Has patient had a PCN reaction causing immediate rash, facial/tongue/throat swelling, SOB or lightheadedness with hypotension: Yes Has patient had a PCN reaction causing severe rash involving mucus membranes or skin necrosis: No Has patient had a PCN reaction that required hospitalization No Has patient had a PCN reaction occurring within the last 10 y  . Zolpidem Shortness Of Breath  . Demerol [Meperidine] Other (See Comments)    Reaction:  Hallucinations    .  Dilaudid [Hydromorphone Hcl] Other (See Comments)    Reaction:  Hallucinations   . Levofloxacin Other (See Comments)    Reaction:  Unknown     IV Location/Drains/Wounds Patient Lines/Drains/Airways Status   Active Line/Drains/Airways    Name:   Placement date:   Placement time:   Site:   Days:   Peripheral IV 06/21/19 Left;Medial;Upper Arm   06/21/19    0452    Arm   less than 1          Labs/Imaging Results for orders placed or performed during the hospital encounter of 06/21/19 (from the past 48 hour(s))  CBG monitoring, ED     Status: Abnormal   Collection Time: 06/21/19  4:25 AM  Result Value Ref Range   Glucose-Capillary 253 (H) 70 - 99 mg/dL  Blood gas, arterial (at [facility])     Status: Abnormal   Collection Time: 06/21/19  4:50 AM  Result Value Ref Range   O2 Content 6.0 L/min   Delivery systems NASAL CANNULA    pH, Arterial 7.274 (L) 7.350 - 7.450   pCO2 arterial 83.2 (HH) 32.0 - 48.0 mmHg    Comment: CRITICAL RESULT CALLED TO, READ BACK BY AND VERIFIED WITH: DR. Christy Gentles M.D. AT 0500 BY PATRICK SWEENEY RRT, RCP ON 06/21/2019    pO2, Arterial 174 (H) 83.0 - 108.0 mmHg   Bicarbonate 37.3 (H) 20.0 - 28.0 mmol/L   Acid-Base Excess 8.7 (H) 0.0 - 2.0 mmol/L   O2 Saturation 99.3 %   Patient temperature 98.6    Collection site RIGHT RADIAL    Drawn by [id number]    Sample type ARTERIAL DRAW    Allens test (pass/fail) PASS PASS    Comment: Performed at [facility], [place] [address], [place],  [address]  Comprehensive metabolic panel     Status: Abnormal   Collection Time: 06/21/19  4:59 AM  Result Value Ref Range   Sodium 136 135 - 145 mmol/L   Potassium 4.5 3.5 - 5.1 mmol/L   Chloride 90 (L) 98 - 111 mmol/L   CO2 36 (H) 22 - 32 mmol/L   Glucose, Bld 250 (H) 70 - 99 mg/dL   BUN 21 8 - 23 mg/dL   Creatinine, Ser 1.36 (H) 0.61 - 1.24 mg/dL   Calcium 8.4 (L) 8.9 - 10.3 mg/dL  Total Protein 7.6 6.5 - 8.1 g/dL   Albumin 3.8 3.5 - 5.0 g/dL   AST 13  (L) 15 - 41 U/L   ALT 12 0 - 44 U/L   Alkaline Phosphatase 147 (H) 38 - 126 U/L   Total Bilirubin 0.6 0.3 - 1.2 mg/dL   GFR calc non Af Amer 50 (L) >60 mL/min   GFR calc Af Amer 58 (L) >60 mL/min   Anion gap 10 5 - 15    Comment: Performed at [facility], [place] [address], [place], [place] [address]  CBC WITH DIFFERENTIAL     Status: Abnormal   Collection Time: 06/21/19  4:59 AM  Result Value Ref Range   WBC 10.4 4.0 - 10.5 K/uL   RBC 3.65 (L) 4.22 - 5.81 MIL/uL   Hemoglobin 9.8 (L) 13.0 - 17.0 g/dL   HCT 34.3 (L) 39.0 - 52.0 %   MCV 94.0 80.0 - 100.0 fL   MCH 26.8 26.0 - 34.0 pg   MCHC 28.6 (L) 30.0 - 36.0 g/dL   RDW 14.8 11.5 - 15.5 %   Platelets 199 150 - 400 K/uL   nRBC 0.0 0.0 - 0.2 %   Neutrophils Relative % 74 %   Neutro Abs 7.8 (H) 1.7 - 7.7 K/uL   Lymphocytes Relative 11 %   Lymphs Abs 1.1 0.7 - 4.0 K/uL   Monocytes Relative 9 %   Monocytes Absolute 0.9 0.1 - 1.0 K/uL   Eosinophils Relative 2 %   Eosinophils Absolute 0.2 0.0 - 0.5 K/uL   Basophils Relative 1 %   Basophils Absolute 0.1 0.0 - 0.1 K/uL   Immature Granulocytes 3 %   Abs Immature Granulocytes 0.30 (H) 0.00 - 0.07 K/uL    Comment: Performed at [facility], [place] [address] [place], [place] [address]  Urinalysis, Complete w Microscopic     Status: Abnormal   Collection Time: 06/21/19  4:59 AM  Result Value Ref Range   Color, Urine YELLOW YELLOW   APPearance HAZY (A) CLEAR   Specific Gravity, Urine 1.017 1.005 - 1.030   pH 5.0 5.0 - 8.0   Glucose, UA NEGATIVE NEGATIVE mg/dL   Hgb urine dipstick NEGATIVE NEGATIVE   Bilirubin Urine NEGATIVE NEGATIVE   Ketones, ur NEGATIVE NEGATIVE mg/dL   Protein, ur 30 (A) NEGATIVE mg/dL   Nitrite NEGATIVE NEGATIVE   Leukocytes,Ua TRACE (A) NEGATIVE   RBC / HPF 0-5 0 - 5 RBC/hpf   WBC, UA 21-50 0 - 5 WBC/hpf   Bacteria, UA RARE (A) NONE SEEN   Squamous Epithelial / LPF 0-5 0 - 5   WBC Clumps PRESENT    Mucus PRESENT      Comment: Performed at [facility], [place] [address], [place], [place] [address]  Lactic acid, plasma     Status: None   Collection Time: 06/21/19  4:59 AM  Result Value Ref Range   Lactic Acid, Venous 1.2 0.5 - 1.9 mmol/L    Comment: Performed at [facility], [place] [address] [place], [place] [address]  SARS Coronavirus 2 [facility] order, Performed in [facility] hospital lab) Nasopharyngeal Nasopharyngeal Swab     Status: None   Collection Time: 06/21/19  4:59 AM   Specimen: Nasopharyngeal Swab  Result Value Ref Range   SARS Coronavirus 2 NEGATIVE NEGATIVE    Comment: (NOTE) If result is NEGATIVE SARS-CoV-2 target nucleic acids are NOT DETECTED. The SARS-CoV-2 RNA is generally detectable in upper  and lower  respiratory specimens during the acute phase of infection. The lowest  concentration of SARS-CoV-2 viral copies this assay can detect is 250  copies / mL. A negative result does not preclude SARS-CoV-2 infection  and should not be used as the sole basis for treatment or other  patient management decisions.  A negative result may occur with  improper specimen collection / handling, submission of specimen other  than nasopharyngeal swab, presence of viral mutation(s) within the  areas targeted by this assay, and inadequate number of viral copies  (<250 copies / mL). A negative result must be combined with clinical  observations, patient history, and epidemiological information. If result is POSITIVE SARS-CoV-2 target nucleic acids are DETECTED. The SARS-CoV-2 RNA is generally detectable in upper and lower  respiratory specimens dur ing the acute phase of infection.  Positive  results are indicative of active infection with SARS-CoV-2.  Clinical  correlation with patient history and other diagnostic information is  necessary to determine patient infection status.  Positive results do  not rule out bacterial infection or co-infection with other  viruses. If result is PRESUMPTIVE POSTIVE SARS-CoV-2 nucleic acids MAY BE PRESENT.   A presumptive positive result was obtained on the submitted specimen  and confirmed on repeat testing.  While 2019 novel coronavirus  (SARS-CoV-2) nucleic acids may be present in the submitted sample  additional confirmatory testing may be necessary for epidemiological  and / or clinical management purposes  to differentiate between  SARS-CoV-2 and other Sarbecovirus currently known to infect humans.  If clinically indicated additional testing with an alternate test  methodology [phone]) is advised. The SARS-CoV-2 RNA is generally  detectable in upper and lower respiratory sp ecimens during the acute  phase of infection. The expected result is Negative. Fact Sheet for Patients:  StrictlyIdeas.no Fact Sheet for Healthcare Providers: BankingDealers.co.za This test is not yet approved or cleared by the [place] FDA and has been authorized for detection and/or diagnosis of SARS-CoV-2 by FDA under an Emergency Use Authorization (EUA).  This EUA will remain in effect (meaning this test can be used) for the duration of the COVID-19 declaration under Section 564(b)(1) of the Act, 21 U.S.C. section 360bbb-3(b)(1), unless the authorization is terminated or revoked sooner. Performed at [facility], [place] [address], [place], [place] [address]   Procalcitonin - Baseline     Status: None   Collection Time: 06/21/19  4:59 AM  Result Value Ref Range   Procalcitonin <0.10 ng/mL    Comment:        Interpretation: PCT (Procalcitonin) <= 0.5 ng/mL: Systemic infection (sepsis) is not likely. Local bacterial infection is possible. (NOTE)       Sepsis PCT Algorithm           Lower Respiratory Tract                                      Infection PCT Algorithm    ----------------------------     ----------------------------         PCT < 0.25  ng/mL                PCT < 0.10 ng/mL         Strongly encourage             Strongly discourage   discontinuation of antibiotics    initiation of antibiotics    ----------------------------     -----------------------------  PCT 0.25 - 0.50 ng/mL            PCT 0.10 - 0.25 ng/mL               OR       >80% decrease in PCT            Discourage initiation of                                            antibiotics      Encourage discontinuation           of antibiotics    ----------------------------     -----------------------------         PCT >= 0.50 ng/mL              PCT 0.26 - 0.50 ng/mL               AND        <80% decrease in PCT             Encourage initiation of                                             antibiotics       Encourage continuation           of antibiotics    ----------------------------     -----------------------------        PCT >= 0.50 ng/mL                  PCT > 0.50 ng/mL               AND         increase in PCT                  Strongly encourage                                      initiation of antibiotics    Strongly encourage escalation           of antibiotics                                     -----------------------------                                           PCT <= 0.25 ng/mL                                                 OR                                        > 80% decrease in PCT  Discontinue / Do not initiate                                             antibiotics Performed at [facility] [address] [place], [place] [address]    Dg Chest Port 1 View  Result Date: 06/21/2019 CLINICAL DATA:  Shortness of breath EXAM: PORTABLE CHEST 1 VIEW COMPARISON:  Radiograph 05/05/2019, CT 03/30/2019 FINDINGS: Basilar predominant interstitial opacities with septal lines, central cuffing and venous congestion. Increased attenuation in the retrocardiac space is likely accentuated  by a large hiatal hernia seen on prior studies. Cannot exclude underlying consolidation. Postsurgical changes related to prior CABG including intact and aligned sternotomy wires and multiple surgical clips projecting over the mediastinum. No acute osseous or soft tissue abnormality. IMPRESSION: 1. Basilar predominant interstitial opacities, central cuffing and venous congestion, concerning for pulmonary edema. 2. Increased attenuation in the retrocardiac space is likely accentuated by a large hiatal hernia seen on prior studies. Cannot exclude underlying consolidation. Electronically Signed   By: Lovena Le M.D.   On: 06/21/2019 05:03    Pending Labs Unresulted Labs (From admission, onward)    Start     Ordered   06/22/19 0500  Procalcitonin  Daily,   R     06/21/19 0713   06/21/19 0829  Brain natriuretic peptide  Once,   STAT     06/21/19 0828   06/21/19 0438  Blood Culture (routine x 2)  BLOOD CULTURE X 2,   STAT     06/21/19 0438   06/21/19 0438  Urine culture  ONCE - STAT,   STAT     06/21/19 0438   Signed and Held  CBC  (enoxaparin (LOVENOX)    CrCl >/= 30 ml/min)  Once,   R    Comments: Baseline for enoxaparin therapy IF NOT ALREADY DRAWN.  Notify MD if PLT < 100 K.    Signed and Held   Signed and Held  Creatinine, serum  (enoxaparin (LOVENOX)    CrCl >/= 30 ml/min)  Once,   R    Comments: Baseline for enoxaparin therapy IF NOT ALREADY DRAWN.    Signed and Held   Signed and Held  Creatinine, serum  (enoxaparin (LOVENOX)    CrCl >/= 30 ml/min)  Weekly,   R    Comments: while on enoxaparin therapy    Signed and Held   Signed and Held  TSH  Once,   R     Signed and Held   Signed and Held  Hemoglobin A1c  Once,   R     Signed and Held   Signed and Held  Basic metabolic panel  Tomorrow morning,   R     Signed and Held   Signed and Held  CBC  Tomorrow morning,   R     Signed and Held          Vitals/Pain Today's Vitals   06/21/19 0636 06/21/19 0700 06/21/19 0838 06/21/19  0841  BP: 122/61 117/60  135/69  Pulse: (!) 103  (!) 101   Resp: 20     Temp:      TempSrc:      SpO2: 98%  98%   Weight:      Height:      PainSc:        Isolation Precautions No active isolations  Medications Medications  albuterol (VENTOLIN HFA) 108 (90 Base) MCG/ACT inhaler 8 puff (8 puffs Inhalation Given 06/21/19 0517)  metroNIDAZOLE (FLAGYL) IVPB 500 mg (0 mg Intravenous Stopped 06/21/19 0702)  ceFEPIme (MAXIPIME) 2 g in sodium chloride 0.9 % 100 mL IVPB (0 g Intravenous Stopped 06/21/19 0550)  vancomycin (VANCOCIN) 2,000 mg in sodium chloride 0.9 % 500 mL IVPB ( Intravenous Stopped 06/21/19 0740)  acetaminophen (TYLENOL) suppository 650 mg (650 mg Rectal Given 06/21/19 0515)  albuterol (PROVENTIL) (2.5 MG/3ML) 0.083% nebulizer solution 5 mg (5 mg Nebulization Given 06/21/19 0659)  ipratropium (ATROVENT) nebulizer solution 0.5 mg (0.5 mg Nebulization Given 06/21/19 0659)    Mobility walks with device

## 2019-06-21 NOTE — ED Notes (Signed)
Entered pts room, he had pulled his BiPap off. When Probation officer of this note asked to put mask back on, pt stated, "no, this is enough, this is enough. I want my other oxygen, I don't want this."   Respiratory was notified, EDP was notified. Pt was placed on [place].
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
## 2019-06-21 NOTE — Progress Notes (Signed)
Called talked to patient in response page from his RN saying he would like to talk to MD. He says his feet are burning because he is not getting his gabapentin.  He also complains about his wife and nursing staff.  He says he did not talk to his wife.  He also says, "my wife has 1 foot in grave and 1 foot on banana peel".  He says, "the staff here don't know what they are doing".  He also complains about the food saying they do not taste good.  I explained the reason for reducing his gabapentin dose.  He is here with acute respiratory failure due to hypercarbia and altered mental status.  He also have CKD-3.  He is on high-dose gabapentin at 800 mg 4 times daily, sometimes taking additional dose in between.  I discussed the danger of high-dose gabapentin in the setting of his respiratory failure and CKD-3.  I have already increased his gabapentin to 600 mg 3 times daily.  I encouraged him to try the 600 mg before we make further adjustments.

## 2019-06-22 DIAGNOSIS — J9600 Acute respiratory failure, unspecified whether with hypoxia or hypercapnia: Secondary | ICD-10-CM

## 2019-06-22 LAB — CBC
HCT: 31.3 % — ABNORMAL LOW (ref 39.0–52.0)
Hemoglobin: 9.2 g/dL — ABNORMAL LOW (ref 13.0–17.0)
MCH: 26.6 pg (ref 26.0–34.0)
MCHC: 29.4 g/dL — ABNORMAL LOW (ref 30.0–36.0)
MCV: 90.5 fL (ref 80.0–100.0)
Platelets: 166 10*3/uL (ref 150–400)
RBC: 3.46 MIL/uL — ABNORMAL LOW (ref 4.22–5.81)
RDW: 14.4 % (ref 11.5–15.5)
WBC: 7.3 10*3/uL (ref 4.0–10.5)
nRBC: 0 % (ref 0.0–0.2)

## 2019-06-22 LAB — MRSA PCR SCREENING: MRSA by PCR: NEGATIVE

## 2019-06-22 LAB — GLUCOSE, CAPILLARY
Glucose-Capillary: 352 mg/dL — ABNORMAL HIGH (ref 70–99)
Glucose-Capillary: 424 mg/dL — ABNORMAL HIGH (ref 70–99)
Glucose-Capillary: 314 mg/dL — ABNORMAL HIGH (ref 70–99)
Glucose-Capillary: 350 mg/dL — ABNORMAL HIGH (ref 70–99)

## 2019-06-22 LAB — URINE CULTURE: Culture: <10000 — AB

## 2019-06-22 LAB — BASIC METABOLIC PANEL
Anion gap: 12 (ref 5–15)
BUN: 24 mg/dL — ABNORMAL HIGH (ref 8–23)
CO2: 31 mmol/L (ref 22–32)
Calcium: 8.8 mg/dL — ABNORMAL LOW (ref 8.9–10.3)
Chloride: 87 mmol/L — ABNORMAL LOW (ref 98–111)
Creatinine, Ser: 1.44 mg/dL — ABNORMAL HIGH (ref 0.61–1.24)
GFR calc Af Amer: 54 mL/min — ABNORMAL LOW (ref >60)
GFR calc non Af Amer: 47 mL/min — ABNORMAL LOW (ref >60)
Glucose, Bld: 333 mg/dL — ABNORMAL HIGH (ref 70–99)
Potassium: 4.9 mmol/L (ref 3.5–5.1)
Sodium: 130 mmol/L — ABNORMAL LOW (ref 135–145)

## 2019-06-22 LAB — PROCALCITONIN: Procalcitonin: <0.1 ng/mL

## 2019-06-22 MED ORDER — BENZONATATE 100 MG PO CAPS
100.0000 mg | ORAL_CAPSULE | Freq: Three times a day (TID) | ORAL | Status: DC | PRN
  Administered 2019-06-22 – 2019-06-24 (×5): 100 mg via ORAL
  Filled 2019-06-22 (×6): qty 1

## 2019-06-22 MED ORDER — INSULIN GLARGINE 100 UNIT/ML ~~LOC~~ SOLN
35.0000 [IU] | Freq: Two times a day (BID) | SUBCUTANEOUS | Status: DC
  Administered 2019-06-22 – 2019-06-25 (×6): 35 [IU] via SUBCUTANEOUS
  Filled 2019-06-22 (×7): qty 0.35

## 2019-06-22 MED ORDER — BISACODYL 10 MG RE SUPP: 10.0000 mg | Freq: Once | RECTAL | Status: DC

## 2019-06-22 MED ORDER — IPRATROPIUM-ALBUTEROL 0.5-2.5 (3) MG/3ML IN SOLN: 3.0000 mL | Freq: Four times a day (QID) | RESPIRATORY_TRACT | Status: DC | PRN

## 2019-06-22 MED ORDER — FUROSEMIDE 10 MG/ML IJ SOLN
60.0000 mg | Freq: Two times a day (BID) | INTRAMUSCULAR | Status: DC
  Administered 2019-06-22: 19:00:00 60 mg via INTRAVENOUS
  Filled 2019-06-22 (×2): qty 6

## 2019-06-22 MED ORDER — INSULIN ASPART 100 UNIT/ML ~~LOC~~ SOLN
20.0000 [IU] | Freq: Three times a day (TID) | SUBCUTANEOUS | Status: DC
  Administered 2019-06-22 – 2019-06-25 (×9): 20 [IU] via SUBCUTANEOUS

## 2019-06-22 NOTE — Progress Notes (Signed)
PROGRESS NOTE    [name]  [id number] DOB: 1941/05/29 DOA: 06/21/2019 PCP: [name], MD  Outpatient Specialists:     Brief Narrative:  [name] is a 78 y.o. male with history of COPD/chronic respiratory failure on 5-6 L, IDDM-2, CAD/CABG, AAA, diastolic CHF, HTN, AVM of GI tract, bipolar disorder/depression/anxiety and morbid obesity.  Patient was admitted with acute on chronic respiratory failure/COPD with exacerbation.  Patient seems to be improving but not back to baseline.  Assessment & Plan:   Active Problems:   Acute respiratory failure (HCC)  Acute on chronic respiratory failure with combined hypoxia and hypercapnia: -Patient has chronic respiratory failure on 4 to 5 L of oxygen via nasal cannula at home -Patient came with decompensation. -ABG is suggestive of combined hypoxia and hypercapnia. -Decompensation is likely secondary to COPD with exacerbation. -Treat underlying process. -She is currently on 3 to 4 L of supplemental oxygen via nasal cannula, but feels that he is not yet back to his baseline. -Continue IV steroids, nebs DuoNeb, nebs Pulmicort.  Will discontinue antibiotics as procalcitonin is within normal range.    COPD with exacerbation: See above. Chest x-ray is not suggestive of pneumonic process. Will DC antibiotics.  Acute encephalopathy, likely metabolic: Encephalopathy has resolved significantly. Continue current management.  Fall at home: Consult physical therapy. Further management depend on hospital course.  Likely acute on chronic diastolic CHF: Continue oral Lasix. IV Lasix 60 mg twice daily. Monitor renal function and electrolytes closely. Optimize diuretic therapy.  Diabetes mellitus: Currently uncontrolled due to steroids Increase subcutaneous Lantus to 35 units twice daily. Increase pre-meal insulin to 20 units 3 times daily Continue moderate sliding scale insulin coverage HbA1c is at goal.  Chronic kidney  disease stage III: This is at baseline.  Coronary artery disease: Stable. No chest pain.  Morbid obesity: Further management on outpatient basis. Patient may also have likely undiagnosed OSA and OHS.  DVT prophylaxis: Subcutaneous Lovenox Code Status: Full code Family Communication:  Disposition Plan: This will depend on hospital course   Consultants:   None  Procedures:   None  Antimicrobials:   Discontinue antibiotics (ceftriaxone and azithromycin)  Subjective: Shortness of breath is improving but not back to baseline No fever or chills  Objective: Vitals:   06/22/19 1100 06/22/19 1300 06/22/19 1400 06/22/19 1500  BP: (!) 158/73     Pulse: 97 84 83 91  Resp:   (!) 24 (!) 23  Temp:      TempSrc:      SpO2: 92% 97% 93% 92%  Weight:      Height:        Intake/Output Summary (Last 24 hours) at 06/22/2019 1527 Last data filed at 06/22/2019 1400 Gross per 24 hour  Intake 806.81 ml  Output 1150 ml  Net -343.19 ml   Filed Weights   06/21/19 0403  Weight: 131.1 kg    Examination:  General exam: Appears calm.  Patient is obese.   Respiratory system: Significantly decreased air entry globally.   Cardiovascular system: S1 & S2 Gastrointestinal system: Abdomen is obese, soft and nontender. No organomegaly or masses felt. Normal bowel sounds heard. Central nervous system: Alert and oriented. No focal neurological deficits. Extremities: Lower leg edema.  Data Reviewed: I have personally reviewed following labs and imaging studies  CBC: Recent Labs  Lab 06/21/19 0459 06/22/19 0214  WBC 10.4 7.3  NEUTROABS 7.8*  --   HGB 9.8* 9.2*  HCT 34.3* 31.3*  MCV 94.0 90.5  PLT 199 [id number]   Basic Metabolic Panel: Recent Labs  Lab 06/21/19 0459 06/22/19 0214  NA 136 130*  K 4.5 4.9  CL 90* 87*  CO2 36* 31  GLUCOSE 250* 333*  BUN 21 24*  CREATININE 1.36* 1.44*  CALCIUM 8.4* 8.8*   GFR: Estimated Creatinine Clearance: 62.6 mL/min (A) (by C-G formula based  on SCr of 1.44 mg/dL (H)). Liver Function Tests: Recent Labs  Lab 06/21/19 0459  AST 13*  ALT 12  ALKPHOS 147*  BILITOT 0.6  PROT 7.6  ALBUMIN 3.8   No results for input(s): LIPASE, AMYLASE in the last 168 hours. No results for input(s): AMMONIA in the last 168 hours. Coagulation Profile: No results for input(s): INR, PROTIME in the last 168 hours. Cardiac Enzymes: No results for input(s): CKTOTAL, CKMB, CKMBINDEX, TROPONINI in the last 168 hours. BNP (last 3 results) No results for input(s): PROBNP in the last 8760 hours. HbA1C: Recent Labs    06/21/19 1330  HGBA1C 6.5*   CBG: Recent Labs  Lab 06/21/19 1151 06/21/19 1745 06/21/19 2151 06/22/19 0825 06/22/19 1214  GLUCAP 131* 266* 338* 352* 424*   Lipid Profile: No results for input(s): CHOL, HDL, LDLCALC, TRIG, CHOLHDL, LDLDIRECT in the last 72 hours. Thyroid Function Tests: Recent Labs    06/21/19 0459  TSH 3.108   Anemia Panel: Recent Labs    06/21/19 0459  VITAMINB12 506  FOLATE 9.1  FERRITIN 20*  TIBC 449  IRON 63  RETICCTPCT 2.9   Urine analysis:    Component Value Date/Time   COLORURINE YELLOW 06/21/2019 0459   APPEARANCEUR HAZY (A) 06/21/2019 0459   LABSPEC 1.017 06/21/2019 0459   PHURINE 5.0 06/21/2019 0459   GLUCOSEU NEGATIVE 06/21/2019 0459   HGBUR NEGATIVE 06/21/2019 0459   BILIRUBINUR NEGATIVE 06/21/2019 0459   BILIRUBINUR small 02/13/2015 1354   KETONESUR NEGATIVE 06/21/2019 0459   PROTEINUR 30 (A) 06/21/2019 0459   UROBILINOGEN 0.2 02/13/2015 1354   UROBILINOGEN 0.2 08/24/2014 1948   NITRITE NEGATIVE 06/21/2019 0459   LEUKOCYTESUR TRACE (A) 06/21/2019 0459   Sepsis Labs: @LABRCNTIP (procalcitonin:4,lacticidven:4)  ) Recent Results (from the past 240 hour(s))  Blood Culture (routine x 2)     Status: None (Preliminary result)   Collection Time: 06/21/19  4:59 AM   Specimen: BLOOD  Result Value Ref Range Status   Specimen Description   Final    BLOOD RIGHT ARM Performed at  [facility], [place] [address] [place], [place] [address]    Special Requests   Final    BOTTLES DRAWN AEROBIC ONLY Blood Culture adequate volume Performed at [place] [address], [place], [place] [address]    Culture   Final    NO GROWTH 1 DAY Performed at [facility], [place] [address] [place], [place] [address]    Report Status PENDING  Incomplete  Blood Culture (routine x 2)     Status: None (Preliminary result)   Collection Time: 06/21/19  4:59 AM   Specimen: BLOOD  Result Value Ref Range Status   Specimen Description   Final    BLOOD LEFT ARM Performed at [facility] [address], [place],  [address]    Special Requests   Final    BOTTLES DRAWN AEROBIC AND ANAEROBIC Blood Culture results may not be optimal due to an excessive volume of blood received in culture bottles Performed at [place] [address], [place],  [address]  Culture   Final    NO GROWTH 1 DAY Performed at [facility], [place] [address], [place], [place] [address]    Report Status PENDING  Incomplete  Urine culture     Status: Abnormal   Collection Time: 06/21/19  4:59 AM   Specimen: In/Out Cath Urine  Result Value Ref Range Status   Specimen Description   Final    IN/OUT CATH URINE Performed at [place] [address] [place], [place] [address]    Special Requests   Final    NONE Performed at [facility], [place] [address] [place], [place] [address]    Culture (A)  Final    <10,000 COLONIES/mL INSIGNIFICANT GROWTH Performed at [place] [address], [place], [place] [address]    Report Status 06/22/2019 FINAL  Final  SARS Coronavirus 2 [facility] order, Performed in [facility] hospital lab) Nasopharyngeal Nasopharyngeal Swab     Status: None   Collection Time: 06/21/19  4:59 AM   Specimen: Nasopharyngeal Swab  Result Value  Ref Range Status   SARS Coronavirus 2 NEGATIVE NEGATIVE Final    Comment: (NOTE) If result is NEGATIVE SARS-CoV-2 target nucleic acids are NOT DETECTED. The SARS-CoV-2 RNA is generally detectable in upper and lower  respiratory specimens during the acute phase of infection. The lowest  concentration of SARS-CoV-2 viral copies this assay can detect is 250  copies / mL. A negative result does not preclude SARS-CoV-2 infection  and should not be used as the sole basis for treatment or other  patient management decisions.  A negative result may occur with  improper specimen collection / handling, submission of specimen other  than nasopharyngeal swab, presence of viral mutation(s) within the  areas targeted by this assay, and inadequate number of viral copies  (<250 copies / mL). A negative result must be combined with clinical  observations, patient history, and epidemiological information. If result is POSITIVE SARS-CoV-2 target nucleic acids are DETECTED. The SARS-CoV-2 RNA is generally detectable in upper and lower  respiratory specimens dur ing the acute phase of infection.  Positive  results are indicative of active infection with SARS-CoV-2.  Clinical  correlation with patient history and other diagnostic information is  necessary to determine patient infection status.  Positive results do  not rule out bacterial infection or co-infection with other viruses. If result is PRESUMPTIVE POSTIVE SARS-CoV-2 nucleic acids MAY BE PRESENT.   A presumptive positive result was obtained on the submitted specimen  and confirmed on repeat testing.  While 2019 novel coronavirus  (SARS-CoV-2) nucleic acids may be present in the submitted sample  additional confirmatory testing may be necessary for epidemiological  and / or clinical management purposes  to differentiate between  SARS-CoV-2 and other Sarbecovirus currently known to infect humans.  If clinically indicated additional testing with an  alternate test  methodology [phone]) is advised. The SARS-CoV-2 RNA is generally  detectable in upper and lower respiratory sp ecimens during the acute  phase of infection. The expected result is Negative. Fact Sheet for Patients:  StrictlyIdeas.no Fact Sheet for Healthcare Providers: BankingDealers.co.za This test is not yet approved or cleared by the [place] FDA and has been authorized for detection and/or diagnosis of SARS-CoV-2 by FDA under an Emergency Use Authorization (EUA).  This EUA will remain in effect (meaning this test can be used) for the duration of the COVID-19 declaration under Section 564(b)(1) of the Act, 21 U.S.C. section 360bbb-3(b)(1),  unless the authorization is terminated or revoked sooner. Performed at [facility], [place] [address], [place], [place] [address]   MRSA PCR Screening     Status: None   Collection Time: 06/22/19 12:24 AM   Specimen: Nasal Mucosa; Nasopharyngeal  Result Value Ref Range Status   MRSA by PCR NEGATIVE NEGATIVE Final    Comment:        The GeneXpert MRSA Assay (FDA approved for NASAL specimens only), is one component of a comprehensive MRSA colonization surveillance program. It is not intended to diagnose MRSA infection nor to guide or monitor treatment for MRSA infections. Performed at [facility], [place] [address], [place], [place] [address]          Radiology Studies: Dg Chest Port 1 View  Result Date: 06/21/2019 CLINICAL DATA:  Shortness of breath EXAM: PORTABLE CHEST 1 VIEW COMPARISON:  Radiograph 05/05/2019, CT 03/30/2019 FINDINGS: Basilar predominant interstitial opacities with septal lines, central cuffing and venous congestion. Increased attenuation in the retrocardiac space is likely accentuated by a large hiatal hernia seen on prior studies. Cannot exclude underlying consolidation. Postsurgical changes related to prior CABG  including intact and aligned sternotomy wires and multiple surgical clips projecting over the mediastinum. No acute osseous or soft tissue abnormality. IMPRESSION: 1. Basilar predominant interstitial opacities, central cuffing and venous congestion, concerning for pulmonary edema. 2. Increased attenuation in the retrocardiac space is likely accentuated by a large hiatal hernia seen on prior studies. Cannot exclude underlying consolidation. Electronically Signed   By: Lovena Le M.D.   On: 06/21/2019 05:03        Scheduled Meds:  atorvastatin  10 mg Oral q1800   bisacodyl  10 mg Rectal Once   budesonide (PULMICORT) nebulizer solution  0.5 mg Nebulization BID   Chlorhexidine Gluconate Cloth  6 each Topical Daily   citalopram  20 mg Oral Daily   docusate sodium  100 mg Oral BID   enoxaparin (LOVENOX) injection  60 mg Subcutaneous Q24H   furosemide  60 mg Oral Daily   gabapentin  600 mg Oral TID   insulin aspart  0-15 Units Subcutaneous TID WC   insulin aspart  0-5 Units Subcutaneous QHS   insulin aspart  10 Units Subcutaneous TID WC   insulin glargine  25 Units Subcutaneous BID   ipratropium-albuterol  3 mL Nebulization Q6H   methylPREDNISolone (SOLU-MEDROL) injection  60 mg Intravenous Q12H   metoprolol tartrate  12.5 mg Oral BID   pantoprazole  40 mg Oral BID   Continuous Infusions:  azithromycin Stopped (06/22/19 1201)   cefTRIAXone (ROCEPHIN)  IV Stopped (06/22/19 1052)     LOS: 1 day    Time spent: Greater than 35 Minutes    Dana Allan, MD  [facility] Pager #: [phone] 7PM-7AM contact night coverage as above

## 2019-06-22 NOTE — Evaluation (Signed)
Physical Therapy Evaluation Patient Details Name: [name] MRN: [id number] DOB: 12-28-1940 Today's Date: 06/22/2019   History of Present Illness  78 y.o. male with history of COPD/chronic respiratory failure on 5-6 L, IDDM-2, CAD/CABG, AAA, diastolic CHF, HTN, AVM of GI tract, bipolar disorder/depression/anxiety and morbid obesity and admitted for Acute on chronic respiratory failure with hypercarbia due to COPD exacerbation  Clinical Impression  Pt admitted with above diagnosis.  Pt currently with functional limitations due to the deficits listed below (see PT Problem List). Pt will benefit from skilled PT to increase their independence and safety with mobility to allow discharge to the venue listed below.  Pt seen with OT and male Therapist, sports.  Pt requiring RW for steadying assist and only able to tolerate short distance due to dyspnea.  Pt appears very sedentary at baseline.  Pt reports he recently had HHPT and "fired" PT.  Will attempt to work with pt as pt allows.     Follow Up Recommendations Home health PT;Supervision for mobility/OOB("fired" last HHPT, may not be agreeable)    Equipment Recommendations  None recommended by PT    Recommendations for Other Services       Precautions / Restrictions Precautions Precautions: Fall Precaution Comments: chronic supplemental O2 Restrictions Weight Bearing Restrictions: No      Mobility  Bed Mobility Overal bed mobility: Needs Assistance             General bed mobility comments: sitting up EOB with RN  Transfers Overall transfer level: Needs assistance Equipment used: Rolling walker (2 wheeled) Transfers: Sit to/from Stand Sit to Stand: Min guard         General transfer comment: min guard for safety, RW for steadying  Ambulation/Gait Ambulation/Gait assistance: Min guard Gait Distance (Feet): 30 Feet Assistive device: Rolling walker (2 wheeled) Gait Pattern/deviations: Step-through pattern;Decreased stride length      General Gait Details: pt ambulated around bed and very unsteady, reaching for bed rail and requested RW, RW provided and pt more steady however fatigues quickly with SOB, ambulated on 4L however increased to 6L with increased dyspnea and pt sat down in recliner and taken back to room in recliner.  SpO2 93% upon returning to room.  RN present for entire session.  Stairs            Wheelchair Mobility    Modified Rankin (Stroke Patients Only)       Balance Overall balance assessment: Needs assistance Sitting-balance support: Bilateral upper extremity supported Sitting balance-Leahy Scale: Good Sitting balance - Comments: sitting EOB without LOB   Standing balance support: Bilateral upper extremity supported;During functional activity Standing balance-Leahy Scale: Poor Standing balance comment: reliant on external support                             Pertinent Vitals/Pain Pain Assessment: No/denies pain    Home Living Family/patient expects to be discharged to:: Private residence Living Arrangements: Spouse/significant other Available Help at Discharge: Family;Personal care attendant Type of Home: House Home Access: Stairs to enter Entrance Stairs-Rails: Psychiatric nurse of Steps: 6 Home Layout: One level Home Equipment: Environmental consultant - 2 wheels;Cane - single point;Bedside commode;Other (comment) Additional Comments: Pt very sedentary at home, doesn't get dressed, sits on couch all day (all information from previous admission one month ago)    Prior Function Level of Independence: Needs assistance   Gait / Transfers Assistance Needed: walks household distances without DME; has cane and  RW, but doesn't use them  ADL's / Homemaking Assistance Needed: assist with LB self care, sponge bathes, no IADLs (spouse completes med mgmt)   Comments: Pt reports he had HHPT and "fired the woman" "sent her packing"     Hand Dominance         Extremity/Trunk Assessment   Upper Extremity Assessment Upper Extremity Assessment: Overall WFL for tasks assessed    Lower Extremity Assessment Lower Extremity Assessment: Defer to PT evaluation       Communication   Communication: HOH  Cognition Arousal/Alertness: Awake/alert Behavior During Therapy: Agitated Overall Cognitive Status: No family/caregiver present to determine baseline cognitive functioning                                 General Comments: agitated throughout session, very belittling and sexually inappropriate to staff. prefers to do things his own way, male RN present. Pt raising voice with male RN at end of session      General Comments      Exercises     Assessment/Plan    PT Assessment Patient needs continued PT services  PT Problem List Decreased mobility;Decreased activity tolerance;Decreased balance;Cardiopulmonary status limiting activity       PT Treatment Interventions DME instruction;Gait training;Therapeutic exercise;Functional mobility training;Patient/family education;Therapeutic activities;Balance training    PT Goals (Current goals can be found in the Care Plan section)  Acute Rehab PT Goals Patient Stated Goal: to go home today PT Goal Formulation: With patient Time For Goal Achievement: 07/06/19 Potential to Achieve Goals: Fair    Frequency Min 2X/week   Barriers to discharge        Co-evaluation PT/OT/SLP Co-Evaluation/Treatment: Yes Reason for Co-Treatment: For patient/therapist safety;To address functional/ADL transfers PT goals addressed during session: Mobility/safety with mobility OT goals addressed during session: ADL's and self-care       AM-PAC PT "6 Clicks" Mobility  Outcome Measure Help needed turning from your back to your side while in a flat bed without using bedrails?: A Little Help needed moving from lying on your back to sitting on the side of a flat bed without using bedrails?: A  Little Help needed moving to and from a bed to a chair (including a wheelchair)?: A Little Help needed standing up from a chair using your arms (e.g., wheelchair or bedside chair)?: A Little Help needed to walk in hospital room?: A Little Help needed climbing 3-5 steps with a railing? : A Lot 6 Click Score: 17    End of Session Equipment Utilized During Treatment: Oxygen Activity Tolerance: (limited by dyspnea) Patient left: in chair;with call bell/phone within reach;with nursing/sitter in room Nurse Communication: Mobility status PT Visit Diagnosis: Difficulty in walking, not elsewhere classified (R26.2)    Time: [id number] PT Time Calculation (min) (ACUTE ONLY): 16 min   Charges:   PT Evaluation $PT Eval Low Complexity: [place], PT, DPT Acute Rehabilitation Services Office: [phone] Pager: [phone]   Trena Platt 06/22/2019, 1:38 PM

## 2019-06-22 NOTE — Progress Notes (Signed)
The pt has repeatedly been inappropriate this shift. He has been asked to steer clear of making sexual remarks. He has been rude to staff and refusing bipap. Have placed him on bipap multiple times and he removes. RT aware. VSS. Will continue to monitor.  Hoyle Barr, RN

## 2019-06-22 NOTE — Evaluation (Addendum)
Occupational Therapy Evaluation Patient Details Name: [name] MRN: [id number] DOB: 11/18/1940 Today's Date: 06/22/2019    History of Present Illness 78 y.o. male with history of COPD/chronic respiratory failure on 5-6 L, IDDM-2, CAD/CABG, AAA, diastolic CHF, HTN, AVM of GI tract, bipolar disorder/depression/anxiety and morbid obesity and admitted for Acute on chronic respiratory failure with hypercarbia due to COPD exacerbation   Clinical Impression   Pt admitted with above diagnoses, limited 2/2 compromised cardiopulmonary status, weakness, and decreased activity tolerance. Pt very irritable and agitated, belittling and sexually inappropriate with staff. Male RN present throughout session. Pt begrudgingly agreed to functional mobility in hallway, became very short of breath making less than household distance. Pt then deferred further participation in therapy. He states "my last experience with a PT was I sent her packing because this was not working for me". Pt then continuing to make rude/sexual comments so further engagement in session deferred. RN present. Recommend home health functionally, but anticipate he will refuse and/or not participate. Will continue to follow acutely but unsure of how much functional progress will be made given pt unwillingness to participate.    Follow Up Recommendations  Home health OT;Other (comment)(pt will likely refused, fired last therapist)    Equipment Recommendations  None recommended by OT    Recommendations for Other Services       Precautions / Restrictions Precautions Precautions: Fall Precaution Comments: chronic supplemental O2 Restrictions Weight Bearing Restrictions: No      Mobility Bed Mobility Overal bed mobility: Needs Assistance             General bed mobility comments: sitting up EOB with RN  Transfers Overall transfer level: Needs assistance Equipment used: Rolling walker (2 wheeled) Transfers: Sit to/from  Stand Sit to Stand: Min guard         General transfer comment: min guard for safety, RW for steadying    Balance Overall balance assessment: Needs assistance Sitting-balance support: Bilateral upper extremity supported Sitting balance-Leahy Scale: Good Sitting balance - Comments: sitting EOB without LOB   Standing balance support: Bilateral upper extremity supported;During functional activity Standing balance-Leahy Scale: Poor Standing balance comment: reliant on external support                           ADL either performed or assessed with clinical judgement   ADL Overall ADL's : Needs assistance/impaired Eating/Feeding: Independent;Sitting   Grooming: Supervision/safety;Sitting;Standing   Upper Body Bathing: Minimal assistance;Sitting   Lower Body Bathing: Minimal assistance;Moderate assistance;Sit to/from stand;Sitting/lateral leans   Upper Body Dressing : Minimal assistance;Sitting   Lower Body Dressing: Total assistance;Sit to/from stand;Sitting/lateral leans Lower Body Dressing Details (indicate cue type and reason): insisted he cannot don his own socks, OT placed socks at total A Toilet Transfer: Minimal assistance;BSC;RW   Toileting- Clothing Manipulation and Hygiene: Minimal assistance;Sitting/lateral lean;Sit to/from stand   Tub/ Shower Transfer: Minimal assistance;Shower seat   Functional mobility during ADLs: Rolling walker;Min guard General ADL Comments: pt overall self limiting and not engaged in OT     Vision Patient Visual Report: No change from baseline       Perception     Praxis      Pertinent Vitals/Pain Pain Assessment: No/denies pain     Hand Dominance     Extremity/Trunk Assessment Upper Extremity Assessment Upper Extremity Assessment: Overall WFL for tasks assessed   Lower Extremity Assessment Lower Extremity Assessment: Defer to PT evaluation  Communication Communication Communication: HOH   Cognition  Arousal/Alertness: Awake/alert Behavior During Therapy: Agitated Overall Cognitive Status: No family/caregiver present to determine baseline cognitive functioning                                 General Comments: agitated throughout session, very belittling and sexually inappropriate to staff. prefers to do things his own way, male RN present. Pt raising voice with male RN at end of session   General Comments       Exercises     Shoulder Instructions      Home Living Family/patient expects to be discharged to:: Private residence Living Arrangements: Spouse/significant other Available Help at Discharge: Family;Personal care attendant Type of Home: House Home Access: Stairs to enter [employer] of Steps: 6 Entrance Stairs-Rails: [place]: One level               Home Equipment: Environmental consultant - 2 wheels;Cane - single point;Bedside commode;Other (comment)   Additional Comments: Pt very sedentary at home, doesn't get dressed, sits on couch all day (all information from previous admission one month ago)      Prior Functioning/Environment Level of Independence: Needs assistance  Gait / Transfers Assistance Needed: walks household distances without DME; has cane and RW, but doesn't use them ADL's / Homemaking Assistance Needed: assist with LB self care, sponge bathes, no IADLs (spouse completes med mgmt)    Comments: Pt reports he had HHPT and "fired the woman" "sent her packing"        OT Problem List: Decreased strength;Decreased knowledge of use of DME or AE;Decreased knowledge of precautions;Decreased activity tolerance;Cardiopulmonary status limiting activity;Impaired balance (sitting and/or standing)      OT Treatment/Interventions: Self-care/ADL training;Therapeutic exercise;Patient/family education;Energy conservation;Therapeutic activities;DME and/or AE instruction    OT Goals(Current goals can be found in the care plan section) Acute  Rehab OT Goals Patient Stated Goal: to go home today OT Goal Formulation: With patient Time For Goal Achievement: 07/06/19 Potential to Achieve Goals: Good  OT Frequency: Min 2X/week   Barriers to D/C:            Co-evaluation PT/OT/SLP Co-Evaluation/Treatment: Yes Reason for Co-Treatment: For patient/therapist safety;To address functional/ADL transfers PT goals addressed during session: Mobility/safety with mobility OT goals addressed during session: ADL's and self-care      AM-PAC OT "6 Clicks" Daily Activity     Outcome Measure Help from another person eating meals?: None Help from another person taking care of personal grooming?: A Little Help from another person toileting, which includes using toliet, bedpan, or urinal?: A Little Help from another person bathing (including washing, rinsing, drying)?: A Lot Help from another person to put on and taking off regular upper body clothing?: None Help from another person to put on and taking off regular lower body clothing?: A Lot 6 Click Score: 18   End of Session Equipment Utilized During Treatment: Gait belt;Rolling walker;Oxygen Nurse Communication: Mobility status  Activity Tolerance: Treatment limited secondary to agitation Patient left: in chair;with call bell/phone within reach;with nursing/sitter in room  OT Visit Diagnosis: Unsteadiness on feet (R26.81);Other abnormalities of gait and mobility (R26.89)                Time: [id number] OT Time Calculation (min): 23 min Charges:  OT General Charges $OT Visit: 1 Visit OT Evaluation $OT Eval Moderate Complexity: 1 Mod  Zenovia Jarred, MSOT, OTR/L [employer] OT/ Acute Relief  OT [facility] Office: [phone]  Zenovia Jarred 06/22/2019, 1:32 PM

## 2019-06-23 LAB — RENAL FUNCTION PANEL
Albumin: 3.6 g/dL (ref 3.5–5.0)
Anion gap: 16 — ABNORMAL HIGH (ref 5–15)
BUN: 41 mg/dL — ABNORMAL HIGH (ref 8–23)
CO2: 33 mmol/L — ABNORMAL HIGH (ref 22–32)
Calcium: 9.1 mg/dL (ref 8.9–10.3)
Chloride: 81 mmol/L — ABNORMAL LOW (ref 98–111)
Creatinine, Ser: 1.45 mg/dL — ABNORMAL HIGH (ref 0.61–1.24)
GFR calc Af Amer: 53 mL/min — ABNORMAL LOW (ref >60)
GFR calc non Af Amer: 46 mL/min — ABNORMAL LOW (ref >60)
Glucose, Bld: 281 mg/dL — ABNORMAL HIGH (ref 70–99)
Phosphorus: 3.7 mg/dL (ref 2.5–4.6)
Potassium: 4.5 mmol/L (ref 3.5–5.1)
Sodium: 130 mmol/L — ABNORMAL LOW (ref 135–145)

## 2019-06-23 LAB — CBC WITH DIFFERENTIAL/PLATELET
Abs Immature Granulocytes: 0.18 10*3/uL — ABNORMAL HIGH (ref 0.00–0.07)
Basophils Absolute: 0 10*3/uL (ref 0.0–0.1)
Basophils Relative: 0 %
Eosinophils Absolute: 0 10*3/uL (ref 0.0–0.5)
Eosinophils Relative: 0 %
HCT: 32.1 % — ABNORMAL LOW (ref 39.0–52.0)
Hemoglobin: 9.7 g/dL — ABNORMAL LOW (ref 13.0–17.0)
Immature Granulocytes: 2 %
Lymphocytes Relative: 5 %
Lymphs Abs: 0.6 10*3/uL — ABNORMAL LOW (ref 0.7–4.0)
MCH: 26.6 pg (ref 26.0–34.0)
MCHC: 30.2 g/dL (ref 30.0–36.0)
MCV: 87.9 fL (ref 80.0–100.0)
Monocytes Absolute: 0.4 10*3/uL (ref 0.1–1.0)
Monocytes Relative: 4 %
Neutro Abs: 9.2 10*3/uL — ABNORMAL HIGH (ref 1.7–7.7)
Neutrophils Relative %: 89 %
Platelets: 173 10*3/uL (ref 150–400)
RBC: 3.65 MIL/uL — ABNORMAL LOW (ref 4.22–5.81)
RDW: 14.5 % (ref 11.5–15.5)
WBC: 10.4 10*3/uL (ref 4.0–10.5)
nRBC: 0 % (ref 0.0–0.2)

## 2019-06-23 LAB — GLUCOSE, CAPILLARY
Glucose-Capillary: 298 mg/dL — ABNORMAL HIGH (ref 70–99)
Glucose-Capillary: 319 mg/dL — ABNORMAL HIGH (ref 70–99)
Glucose-Capillary: 338 mg/dL — ABNORMAL HIGH (ref 70–99)
Glucose-Capillary: 282 mg/dL — ABNORMAL HIGH (ref 70–99)

## 2019-06-23 LAB — MAGNESIUM: Magnesium: 2.1 mg/dL (ref 1.7–2.4)

## 2019-06-23 LAB — PROCALCITONIN: Procalcitonin: 0.25 ng/mL

## 2019-06-23 MED ORDER — TORSEMIDE 20 MG PO TABS
30.0000 mg | ORAL_TABLET | Freq: Every day | ORAL | Status: DC
  Administered 2019-06-23 – 2019-06-25 (×3): 30 mg via ORAL
  Filled 2019-06-23 (×3): qty 1

## 2019-06-23 NOTE — Progress Notes (Signed)
PROGRESS NOTE    [name]  [id number] DOB: 28-Apr-1941 DOA: 06/21/2019 PCP: [name], MD  Outpatient Specialists:     Brief Narrative:  [name] is a 78 y.o. male with history of COPD/chronic respiratory failure on 4-5 L at home (patient is also supposed to be on BiPAP at night at home, but patient has not been very compliant), IDDM-2, CAD/CABG, AAA, diastolic CHF, HTN, AVM of GI tract, bipolar disorder/depression/anxiety and morbid obesity.  Patient was admitted with acute on chronic respiratory failure/COPD with exacerbation.  Patient seems to be improving but not back to baseline.  06/23/2019: Patient seen alongside patient's nurse.  Patient continues to improve.  Patient was on a compliant with the BiPAP for about 4 hours last night.  Patient also seems to be posing some management challenges.  Patient seems disinhibited, with a lot of social content in his speech.  Patient's baseline is not known.  Not sure if there is component of frontal lobe dementia or effect of the high-dose IV steroids.  Patient feels 50% improved.  Assessment & Plan:   Active Problems:   Acute respiratory failure (HCC)  Acute on chronic respiratory failure with combined hypoxia and hypercapnia: -Patient has chronic respiratory failure on 4 to 5 L of oxygen via nasal cannula at home -Patient is supposed to be on intermittent BiPAP at home, but seems noncompliant with this.  -Patient presented with COPD exacerbation with acute on chronic combined hypoxia and hypercapnia. -Kindly see above. -Patient tolerated 4 hours of BiPAP last night, with some resistance to intervention.   -Continue IV steroids, nebs DuoNeb, nebs Pulmicort.   -Antibiotics have been discontinued.   -We will consult case management as per patient will need home health to assist with BiPAP compliance on discharge.  COPD with exacerbation: See above. Chest x-ray was not suggestive of pneumonic process. Antibiotics have  been discontinued.  Acute encephalopathy, likely metabolic: There is a documentation of memory loss.   Patient's baseline is not known.   Irrespective of above, acute encephalopathy seems to be resolving.   -Patient's speech has significant surgical flavor, called into question possible frontal lobe dementia versus effect of steroids.   -Manage expectantly. -Collateral information will be key when feasible.  Fall at home: PT/OT input is highly appreciated (see note and recommendations).    Likely acute on chronic diastolic CHF: DC IV Lasix. Change oral diuretics to torsemide, considering pharmacokinetics. Will start patient on torsemide 30 Mg p.o. once daily.  Diabetes mellitus: Currently uncontrolled due to steroids Increase subcutaneous Lantus to 35 units twice daily. Increase pre-meal insulin to 20 units 3 times daily Continue moderate sliding scale insulin coverage HbA1c is at goal.  Chronic kidney disease stage III: This is at baseline.  Coronary artery disease: Stable. No chest pain.  Morbid obesity: Further management on outpatient basis. Patient may also have likely undiagnosed OSA and OHS.  DVT prophylaxis: Subcutaneous Lovenox Code Status: Full code Family Communication:  Disposition Plan: This will depend on hospital course   Consultants:   None  Procedures:   None  Antimicrobials:   Discontinued antibiotics on 06/22/2019.  Subjective: No new complaints. Patient is 50% improved. No fever or chills. Shortness of breath is improving. No chest pain.   Objective: Vitals:   06/23/19 0700 06/23/19 0800 06/23/19 0818 06/23/19 0900  BP:  (!) 172/96    Pulse: 76 80  81  Resp: 20 17  13   Temp:   (!) 97.5 F (36.4 C)  TempSrc:   Oral   SpO2: 94% 94%  91%  Weight:      Height:        Intake/Output Summary (Last 24 hours) at 06/23/2019 1039 Last data filed at 06/23/2019 0148 Gross per 24 hour  Intake 326.81 ml  Output 4400 ml  Net -4073.19 ml    Filed Weights   06/21/19 0403  Weight: 131.1 kg    Examination:  General exam: Appears calm.  Patient is obese.   Respiratory system: Significantly decreased air entry globally, but slowly improving.   Cardiovascular system: S1 & S2 Gastrointestinal system: Abdomen is obese, soft and nontender. No organomegaly or masses felt. Normal bowel sounds heard. Central nervous system: Alert and oriented. No focal neurological deficits. Extremities: No leg edema.  Data Reviewed: I have personally reviewed following labs and imaging studies  CBC: Recent Labs  Lab 06/21/19 0459 06/22/19 0214 06/23/19 0240  WBC 10.4 7.3 10.4  NEUTROABS 7.8*  --  9.2*  HGB 9.8* 9.2* 9.7*  HCT 34.3* 31.3* 32.1*  MCV 94.0 90.5 87.9  PLT 199 166 [id number]   Basic Metabolic Panel: Recent Labs  Lab 06/21/19 0459 06/22/19 0214 06/23/19 0240  NA 136 130* 130*  K 4.5 4.9 4.5  CL 90* 87* 81*  CO2 36* 31 33*  GLUCOSE 250* 333* 281*  BUN 21 24* 41*  CREATININE 1.36* 1.44* 1.45*  CALCIUM 8.4* 8.8* 9.1  MG  --   --  2.1  PHOS  --   --  3.7   GFR: Estimated Creatinine Clearance: 62.2 mL/min (A) (by C-G formula based on SCr of 1.45 mg/dL (H)). Liver Function Tests: Recent Labs  Lab 06/21/19 0459 06/23/19 0240  AST 13*  --   ALT 12  --   ALKPHOS 147*  --   BILITOT 0.6  --   PROT 7.6  --   ALBUMIN 3.8 3.6   No results for input(s): LIPASE, AMYLASE in the last 168 hours. No results for input(s): AMMONIA in the last 168 hours. Coagulation Profile: No results for input(s): INR, PROTIME in the last 168 hours. Cardiac Enzymes: No results for input(s): CKTOTAL, CKMB, CKMBINDEX, TROPONINI in the last 168 hours. BNP (last 3 results) No results for input(s): PROBNP in the last 8760 hours. HbA1C: Recent Labs    06/21/19 1330  HGBA1C 6.5*   CBG: Recent Labs  Lab 06/22/19 0825 06/22/19 1214 06/22/19 1824 06/22/19 2122 06/23/19 0747  GLUCAP 352* 424* 314* 350* 298*   Lipid Profile: No results  for input(s): CHOL, HDL, LDLCALC, TRIG, CHOLHDL, LDLDIRECT in the last 72 hours. Thyroid Function Tests: Recent Labs    06/21/19 0459  TSH 3.108   Anemia Panel: Recent Labs    06/21/19 0459  VITAMINB12 506  FOLATE 9.1  FERRITIN 20*  TIBC 449  IRON 63  RETICCTPCT 2.9   Urine analysis:    Component Value Date/Time   COLORURINE YELLOW 06/21/2019 0459   APPEARANCEUR HAZY (A) 06/21/2019 0459   LABSPEC 1.017 06/21/2019 0459   PHURINE 5.0 06/21/2019 0459   GLUCOSEU NEGATIVE 06/21/2019 0459   HGBUR NEGATIVE 06/21/2019 0459   BILIRUBINUR NEGATIVE 06/21/2019 0459   BILIRUBINUR small 02/13/2015 1354   KETONESUR NEGATIVE 06/21/2019 0459   PROTEINUR 30 (A) 06/21/2019 0459   UROBILINOGEN 0.2 02/13/2015 1354   UROBILINOGEN 0.2 08/24/2014 1948   NITRITE NEGATIVE 06/21/2019 0459   LEUKOCYTESUR TRACE (A) 06/21/2019 0459   Sepsis Labs: @LABRCNTIP (procalcitonin:4,lacticidven:4)  ) Recent Results (from the past 240 hour(s))  Blood  Culture (routine x 2)     Status: None (Preliminary result)   Collection Time: 06/21/19  4:59 AM   Specimen: BLOOD  Result Value Ref Range Status   Specimen Description   Final    BLOOD RIGHT ARM Performed at [facility] [address] [place], [place] [address]    Special Requests   Final    BOTTLES DRAWN AEROBIC ONLY Blood Culture adequate volume Performed at [facility] [address], [place], [place] [address]    Culture   Final    NO GROWTH 2 DAYS Performed at [facility] [address] [place], [place] [address]    Report Status PENDING  Incomplete  Blood Culture (routine x 2)     Status: None (Preliminary result)   Collection Time: 06/21/19  4:59 AM   Specimen: BLOOD  Result Value Ref Range Status   Specimen Description   Final    BLOOD LEFT ARM Performed at [facility] [address] [place], [place] [address]    Special Requests   Final    BOTTLES DRAWN AEROBIC  AND ANAEROBIC Blood Culture results may not be optimal due to an excessive volume of blood received in culture bottles Performed at [facility] [address] [place], [place] [address]    Culture   Final    NO GROWTH 2 DAYS Performed at [facility] [address], [place], [place] [address]    Report Status PENDING  Incomplete  Urine culture     Status: Abnormal   Collection Time: 06/21/19  4:59 AM   Specimen: In/Out Cath Urine  Result Value Ref Range Status   Specimen Description   Final    IN/OUT CATH URINE Performed at [facility] [address], [place], [place] [address]    Special Requests   Final    NONE Performed at [facility], [place] [address], [place], [place] [address]    Culture (A)  Final    <10,000 COLONIES/mL INSIGNIFICANT GROWTH Performed at [facility] [address], [place], [place] [address]    Report Status 06/22/2019 FINAL  Final  SARS Coronavirus 2 [facility] order, Performed in [facility] hospital lab) Nasopharyngeal Nasopharyngeal Swab     Status: None   Collection Time: 06/21/19  4:59 AM   Specimen: Nasopharyngeal Swab  Result Value Ref Range Status   SARS Coronavirus 2 NEGATIVE NEGATIVE Final    Comment: (NOTE) If result is NEGATIVE SARS-CoV-2 target nucleic acids are NOT DETECTED. The SARS-CoV-2 RNA is generally detectable in upper and lower  respiratory specimens during the acute phase of infection. The lowest  concentration of SARS-CoV-2 viral copies this assay can detect is 250  copies / mL. A negative result does not preclude SARS-CoV-2 infection  and should not be used as the sole basis for treatment or other  patient management decisions.  A negative result may occur with  improper specimen collection / handling, submission of specimen other  than nasopharyngeal swab, presence of viral mutation(s) within the  areas targeted by this assay, and inadequate number of  viral copies  (<250 copies / mL). A negative result must be combined with clinical  observations, patient history, and epidemiological information. If result is POSITIVE SARS-CoV-2 target nucleic acids are DETECTED. The SARS-CoV-2 RNA is generally detectable in upper and lower  respiratory specimens dur ing the acute phase of infection.  Positive  results are indicative of active infection with SARS-CoV-2.  Clinical  correlation with patient history and other diagnostic information is  necessary to determine patient infection status.  Positive results do  not rule out bacterial infection or co-infection with other viruses. If result is PRESUMPTIVE POSTIVE SARS-CoV-2 nucleic acids MAY BE PRESENT.   A presumptive positive result was obtained on the submitted specimen  and confirmed on repeat testing.  While 2019 novel coronavirus  (SARS-CoV-2) nucleic acids may be present in the submitted sample  additional confirmatory testing may be necessary for epidemiological  and / or clinical management purposes  to differentiate between  SARS-CoV-2 and other Sarbecovirus currently known to infect humans.  If clinically indicated additional testing with an alternate test  methodology [phone]) is advised. The SARS-CoV-2 RNA is generally  detectable in upper and lower respiratory sp ecimens during the acute  phase of infection. The expected result is Negative. Fact Sheet for Patients:  StrictlyIdeas.no Fact Sheet for Healthcare Providers: BankingDealers.co.za This test is not yet approved or cleared by the [place] FDA and has been authorized for detection and/or diagnosis of SARS-CoV-2 by FDA under an Emergency Use Authorization (EUA).  This EUA will remain in effect (meaning this test can be used) for the duration of the COVID-19 declaration under Section 564(b)(1) of the Act, 21 U.S.C. section 360bbb-3(b)(1), unless the authorization is  terminated or revoked sooner. Performed at [facility], [place] [address] [place], [place] [address]   MRSA PCR Screening     Status: None   Collection Time: 06/22/19 12:24 AM   Specimen: Nasal Mucosa; Nasopharyngeal  Result Value Ref Range Status   MRSA by PCR NEGATIVE NEGATIVE Final    Comment:        The GeneXpert MRSA Assay (FDA approved for NASAL specimens only), is one component of a comprehensive MRSA colonization surveillance program. It is not intended to diagnose MRSA infection nor to guide or monitor treatment for MRSA infections. Performed at [facility], [place] [address] [place], [place] [address]          Radiology Studies: No results found.      Scheduled Meds: . atorvastatin  10 mg Oral q1800  . bisacodyl  10 mg Rectal Once  . budesonide (PULMICORT) nebulizer solution  0.5 mg Nebulization BID  . Chlorhexidine Gluconate Cloth  6 each Topical Daily  . citalopram  20 mg Oral Daily  . docusate sodium  100 mg Oral BID  . enoxaparin (LOVENOX) injection  60 mg Subcutaneous Q24H  . furosemide  60 mg Intravenous BID  . gabapentin  600 mg Oral TID  . insulin aspart  0-15 Units Subcutaneous TID WC  . insulin aspart  0-5 Units Subcutaneous QHS  . insulin aspart  20 Units Subcutaneous TID WC  . insulin glargine  35 Units Subcutaneous BID  . ipratropium-albuterol  3 mL Nebulization Q6H  . methylPREDNISolone (SOLU-MEDROL) injection  60 mg Intravenous Q12H  . metoprolol tartrate  12.5 mg Oral BID  . pantoprazole  40 mg Oral BID   Continuous Infusions:    LOS: 2 days    Time spent: 51 Minutes    Dana Allan, MD  [facility] Pager #: [phone] 7PM-7AM contact night coverage as above

## 2019-06-23 NOTE — Progress Notes (Signed)
Pt has removed BIPAP several times at this point in the night.  Pt doesn't want to wear at this time, RT to monitor and assess as needed.

## 2019-06-23 NOTE — Progress Notes (Signed)
RT tried to place patient on his nasal canula but patient refused. RN is aware. RT will continue to monitor

## 2019-06-24 ENCOUNTER — Inpatient Hospital Stay (HOSPITAL_COMMUNITY): Payer: Medicare HMO

## 2019-06-24 DIAGNOSIS — R739 Hyperglycemia, unspecified: Secondary | ICD-10-CM

## 2019-06-24 LAB — GLUCOSE, CAPILLARY
Glucose-Capillary: 188 mg/dL — ABNORMAL HIGH (ref 70–99)
Glucose-Capillary: 217 mg/dL — ABNORMAL HIGH (ref 70–99)
Glucose-Capillary: 147 mg/dL — ABNORMAL HIGH (ref 70–99)
Glucose-Capillary: 144 mg/dL — ABNORMAL HIGH (ref 70–99)

## 2019-06-24 LAB — CBC WITH DIFFERENTIAL/PLATELET
Abs Immature Granulocytes: 0.3 10*3/uL — ABNORMAL HIGH (ref 0.00–0.07)
Basophils Absolute: 0 10*3/uL (ref 0.0–0.1)
Basophils Relative: 0 %
Eosinophils Absolute: 0 10*3/uL (ref 0.0–0.5)
Eosinophils Relative: 0 %
HCT: 33.8 % — ABNORMAL LOW (ref 39.0–52.0)
Hemoglobin: 10.3 g/dL — ABNORMAL LOW (ref 13.0–17.0)
Immature Granulocytes: 3 %
Lymphocytes Relative: 8 %
Lymphs Abs: 0.8 10*3/uL (ref 0.7–4.0)
MCH: 27.2 pg (ref 26.0–34.0)
MCHC: 30.5 g/dL (ref 30.0–36.0)
MCV: 89.2 fL (ref 80.0–100.0)
Monocytes Absolute: 0.8 10*3/uL (ref 0.1–1.0)
Monocytes Relative: 8 %
Neutro Abs: 8 10*3/uL — ABNORMAL HIGH (ref 1.7–7.7)
Neutrophils Relative %: 81 %
Platelets: 213 10*3/uL (ref 150–400)
RBC: 3.79 MIL/uL — ABNORMAL LOW (ref 4.22–5.81)
RDW: 14.6 % (ref 11.5–15.5)
WBC: 9.9 10*3/uL (ref 4.0–10.5)
nRBC: 0 % (ref 0.0–0.2)

## 2019-06-24 LAB — RENAL FUNCTION PANEL
Albumin: 3.7 g/dL (ref 3.5–5.0)
Anion gap: 15 (ref 5–15)
BUN: 53 mg/dL — ABNORMAL HIGH (ref 8–23)
CO2: 33 mmol/L — ABNORMAL HIGH (ref 22–32)
Calcium: 9.1 mg/dL (ref 8.9–10.3)
Chloride: 84 mmol/L — ABNORMAL LOW (ref 98–111)
Creatinine, Ser: 1.49 mg/dL — ABNORMAL HIGH (ref 0.61–1.24)
GFR calc Af Amer: 52 mL/min — ABNORMAL LOW (ref >60)
GFR calc non Af Amer: 45 mL/min — ABNORMAL LOW (ref >60)
Glucose, Bld: 222 mg/dL — ABNORMAL HIGH (ref 70–99)
Phosphorus: 3.9 mg/dL (ref 2.5–4.6)
Potassium: 3.9 mmol/L (ref 3.5–5.1)
Sodium: 132 mmol/L — ABNORMAL LOW (ref 135–145)

## 2019-06-24 LAB — MAGNESIUM: Magnesium: 2.4 mg/dL (ref 1.7–2.4)

## 2019-06-24 MED ORDER — CHLORHEXIDINE GLUCONATE 0.12 % MT SOLN
15.0000 mL | Freq: Two times a day (BID) | OROMUCOSAL | Status: DC
  Administered 2019-06-24 – 2019-06-25 (×3): 15 mL via OROMUCOSAL
  Filled 2019-06-24 (×3): qty 15

## 2019-06-24 MED ORDER — ORAL CARE MOUTH RINSE: 15.0000 mL | Freq: Two times a day (BID) | OROMUCOSAL | Status: DC

## 2019-06-24 MED ORDER — IPRATROPIUM-ALBUTEROL 0.5-2.5 (3) MG/3ML IN SOLN
3.0000 mL | Freq: Three times a day (TID) | RESPIRATORY_TRACT | Status: DC
  Administered 2019-06-24 – 2019-06-25 (×4): 3 mL via RESPIRATORY_TRACT
  Filled 2019-06-24 (×4): qty 3

## 2019-06-24 NOTE — Progress Notes (Signed)
Per pt request, placed on bipap (15/5) 5L o2 bled in.

## 2019-06-24 NOTE — Progress Notes (Signed)
PROGRESS NOTE    [name]  [id number] DOB: Oct 26, 1940 DOA: 06/21/2019 PCP: [name], MD   Brief Narrative:78 y.o.malewith history ofCOPD/chronic respiratory failure on 4-5L at home (patient is also supposed to be on BiPAP at night at home, but patient has not been very compliant), IDDM-2, CAD/CABG, AAA, diastolic CHF, HTN, AVM of GI tract, bipolar disorder/depression/anxietyandmorbid obesity.  Patient was admitted with acute on chronic respiratory failure/COPD with exacerbation.  Patient seems to be improving but not back to baseline.  06/24/2019 patient tolerated BiPAP over 6 hours last night according to patient.  He is awake alert oriented and talking in full sentences without any difficulty in breathing.  He reports he has CPAP at home which is new to him and his wife and has been struggling to learn how to use that.  Assessment & Plan:   Active Problems:   Acute respiratory failure (HCC)  #1 acute on chronic hypoxic hypercapnic respiratory failure secondary to COPD with no evidence of infiltrates on chest x-ray.  He is on 4 to 5 L of oxygen at home 24/7. continue DuoNeb and Pulmicort.  Antibiotics has been stopped.  Will consult case manager to assist with his home CPAP.  Patient reports he is very weak in his body and has not walked since coming to the hospital.  I have reviewed PT notes and they recommend home health PT.  Will reconsult PT.patient uncomfortable to be discharged home today at the same he is also refusing to go to rehab if needed.  #2 chronic diastolic CHF continue Demadex  #3 type 2 diabetes continue Lantus and pre-meal insulin 20 units 3 times a day.  #4 CKD stage III stable  #5 status post fall at home seen by PT.  #6 acute metabolic encephalopathy which has been resolved.   DVT prophylaxis: Subcutaneous Lovenox Code Status: Full code Family Communication: none Disposition Plan: pending pt re eval   Consultants:   None  Procedures:    None  Antimicrobials:   Discontinued antibiotics on 06/22/2019.     Estimated body mass index is 36.12 kg/m as calculated from the following:   Height as of this encounter: 6\' 3"  (1.905 m).   Weight as of this encounter: 131.1 kg.    Subjective: Feels better reports he lives with his elderly wife of 69 years who has been having chemotherapy for colon cancer has very much less support otherwise at home.  Objective: Vitals:   06/24/19 0800 06/24/19 0814 06/24/19 0906 06/24/19 1032  BP: (!) 174/88 (!) 174/88  114/62  Pulse: 69 75  79  Resp: 13   16  Temp: 97.8 F (36.6 C)   97.8 F (36.6 C)  TempSrc: Oral   Oral  SpO2: 97%  97% 90%  Weight:      Height:        Intake/Output Summary (Last 24 hours) at 06/24/2019 1052 Last data filed at 06/24/2019 1000 Gross per 24 hour  Intake 480 ml  Output 3700 ml  Net -3220 ml   Filed Weights   06/21/19 0403  Weight: 131.1 kg    Examination:  General exam: Appears calm and comfortable  Respiratory system scattered wheezes  to auscultation. Respiratory effort normal. Cardiovascular system: S1 & S2 heard, RRR. No JVD, murmurs, rubs, gallops or clicks. No pedal edema. Gastrointestinal system: Abdomen is nondistended, soft and nontender. No organomegaly or masses felt. Normal bowel sounds heard. Central nervous system: Alert and oriented. No focal neurological deficits. Extremities: Symmetric 5  x 5 power.no edema Skin: No rashes, lesions or ulcers Psychiatry: Judgement and insight appear normal. Mood & affect appropriate.     Data Reviewed: I have personally reviewed following labs and imaging studies  CBC: Recent Labs  Lab 06/21/19 0459 06/22/19 0214 06/23/19 0240 06/24/19 0212  WBC 10.4 7.3 10.4 9.9  NEUTROABS 7.8*  --  9.2* 8.0*  HGB 9.8* 9.2* 9.7* 10.3*  HCT 34.3* 31.3* 32.1* 33.8*  MCV 94.0 90.5 87.9 89.2  PLT 199 166 173 [id number]   Basic Metabolic Panel: Recent Labs  Lab 06/21/19 0459 06/22/19 0214 06/23/19  0240 06/24/19 0212  NA 136 130* 130* 132*  K 4.5 4.9 4.5 3.9  CL 90* 87* 81* 84*  CO2 36* 31 33* 33*  GLUCOSE 250* 333* 281* 222*  BUN 21 24* 41* 53*  CREATININE 1.36* 1.44* 1.45* 1.49*  CALCIUM 8.4* 8.8* 9.1 9.1  MG  --   --  2.1 2.4  PHOS  --   --  3.7 3.9   GFR: Estimated Creatinine Clearance: 60.5 mL/min (A) (by C-G formula based on SCr of 1.49 mg/dL (H)). Liver Function Tests: Recent Labs  Lab 06/21/19 0459 06/23/19 0240 06/24/19 0212  AST 13*  --   --   ALT 12  --   --   ALKPHOS 147*  --   --   BILITOT 0.6  --   --   PROT 7.6  --   --   ALBUMIN 3.8 3.6 3.7   No results for input(s): LIPASE, AMYLASE in the last 168 hours. No results for input(s): AMMONIA in the last 168 hours. Coagulation Profile: No results for input(s): INR, PROTIME in the last 168 hours. Cardiac Enzymes: No results for input(s): CKTOTAL, CKMB, CKMBINDEX, TROPONINI in the last 168 hours. BNP (last 3 results) No results for input(s): PROBNP in the last 8760 hours. HbA1C: Recent Labs    06/21/19 1330  HGBA1C 6.5*   CBG: Recent Labs  Lab 06/23/19 0747 06/23/19 1218 06/23/19 1710 06/23/19 2053 06/24/19 0804  GLUCAP 298* 319* 338* 282* 188*   Lipid Profile: No results for input(s): CHOL, HDL, LDLCALC, TRIG, CHOLHDL, LDLDIRECT in the last 72 hours. Thyroid Function Tests: No results for input(s): TSH, T4TOTAL, FREET4, T3FREE, THYROIDAB in the last 72 hours. Anemia Panel: No results for input(s): VITAMINB12, FOLATE, FERRITIN, TIBC, IRON, RETICCTPCT in the last 72 hours. Sepsis Labs: Recent Labs  Lab 06/21/19 0459 06/22/19 0214 06/23/19 0240  PROCALCITON <0.10 <0.10 0.25  LATICACIDVEN 1.2  --   --     Recent Results (from the past 240 hour(s))  Blood Culture (routine x 2)     Status: None (Preliminary result)   Collection Time: 06/21/19  4:59 AM   Specimen: BLOOD  Result Value Ref Range Status   Specimen Description   Final    BLOOD RIGHT ARM Performed at [facility] [address], [place], [place] [address]    Special Requests   Final    BOTTLES DRAWN AEROBIC ONLY Blood Culture adequate volume Performed at [facility] [address] [place], [place] [address]    Culture   Final    NO GROWTH 3 DAYS Performed at [facility], [place] [address], [place], [place] [address]    Report Status PENDING  Incomplete  Blood Culture (routine x 2)     Status: None (Preliminary result)   Collection Time: 06/21/19  4:59 AM   Specimen: BLOOD  Result Value Ref  Range Status   Specimen Description   Final    BLOOD LEFT ARM Performed at [facility] [address] [place], [place] [address]    Special Requests   Final    BOTTLES DRAWN AEROBIC AND ANAEROBIC Blood Culture results may not be optimal due to an excessive volume of blood received in culture bottles Performed at [facility] [address] [place], [place] [address]    Culture   Final    NO GROWTH 3 DAYS Performed at [facility], [place] [address], [place], [place] [address]    Report Status PENDING  Incomplete  Urine culture     Status: Abnormal   Collection Time: 06/21/19  4:59 AM   Specimen: In/Out Cath Urine  Result Value Ref Range Status   Specimen Description   Final    IN/OUT CATH URINE Performed at [facility] [address], [place], [place] [address]    Special Requests   Final    NONE Performed at [facility], [place] [address] [place], [place] [address]    Culture (A)  Final    <10,000 COLONIES/mL INSIGNIFICANT GROWTH Performed at [facility] [address] [place],  [address]    Report Status 06/22/2019 FINAL  Final  SARS Coronavirus 2 [facility] order, Performed in [facility] hospital lab) Nasopharyngeal Nasopharyngeal Swab     Status: None   Collection Time: 06/21/19  4:59 AM   Specimen: Nasopharyngeal Swab  Result Value Ref Range  Status   SARS Coronavirus 2 NEGATIVE NEGATIVE Final    Comment: (NOTE) If result is NEGATIVE SARS-CoV-2 target nucleic acids are NOT DETECTED. The SARS-CoV-2 RNA is generally detectable in upper and lower  respiratory specimens during the acute phase of infection. The lowest  concentration of SARS-CoV-2 viral copies this assay can detect is 250  copies / mL. A negative result does not preclude SARS-CoV-2 infection  and should not be used as the sole basis for treatment or other  patient management decisions.  A negative result may occur with  improper specimen collection / handling, submission of specimen other  than nasopharyngeal swab, presence of viral mutation(s) within the  areas targeted by this assay, and inadequate number of viral copies  (<250 copies / mL). A negative result must be combined with clinical  observations, patient history, and epidemiological information. If result is POSITIVE SARS-CoV-2 target nucleic acids are DETECTED. The SARS-CoV-2 RNA is generally detectable in upper and lower  respiratory specimens dur ing the acute phase of infection.  Positive  results are indicative of active infection with SARS-CoV-2.  Clinical  correlation with patient history and other diagnostic information is  necessary to determine patient infection status.  Positive results do  not rule out bacterial infection or co-infection with other viruses. If result is PRESUMPTIVE POSTIVE SARS-CoV-2 nucleic acids MAY BE PRESENT.   A presumptive positive result was obtained on the submitted specimen  and confirmed on repeat testing.  While 2019 novel coronavirus  (SARS-CoV-2) nucleic acids may be present in the submitted sample  additional confirmatory testing may be necessary for epidemiological  and / or clinical management purposes  to differentiate between  SARS-CoV-2 and other Sarbecovirus currently known to infect humans.  If clinically indicated additional testing with an alternate  test  methodology [id number]) is advised. The SARS-CoV-2 RNA is generally  detectable in upper and lower respiratory sp ecimens during the acute  phase of infection.  The expected result is Negative. Fact Sheet for Patients:  StrictlyIdeas.no Fact Sheet for Healthcare Providers: BankingDealers.co.za This test is not yet approved or cleared by the [place] FDA and has been authorized for detection and/or diagnosis of SARS-CoV-2 by FDA under an Emergency Use Authorization (EUA).  This EUA will remain in effect (meaning this test can be used) for the duration of the COVID-19 declaration under Section 564(b)(1) of the Act, 21 U.S.C. section 360bbb-3(b)(1), unless the authorization is terminated or revoked sooner. Performed at [facility], [place] [address] [place], [place] [address]   MRSA PCR Screening     Status: None   Collection Time: 06/22/19 12:24 AM   Specimen: Nasal Mucosa; Nasopharyngeal  Result Value Ref Range Status   MRSA by PCR NEGATIVE NEGATIVE Final    Comment:        The GeneXpert MRSA Assay (FDA approved for NASAL specimens only), is one component of a comprehensive MRSA colonization surveillance program. It is not intended to diagnose MRSA infection nor to guide or monitor treatment for MRSA infections. Performed at [facility], [place] [address], [place],  [address]          Radiology Studies: No results found.      Scheduled Meds: . atorvastatin  10 mg Oral q1800  . bisacodyl  10 mg Rectal Once  . budesonide (PULMICORT) nebulizer solution  0.5 mg Nebulization BID  . chlorhexidine  15 mL Mouth Rinse BID  . Chlorhexidine Gluconate Cloth  6 each Topical Daily  . citalopram  20 mg Oral Daily  . docusate sodium  100 mg Oral BID  . enoxaparin (LOVENOX) injection  60 mg Subcutaneous Q24H  . gabapentin  600 mg Oral TID  . insulin aspart  0-15 Units Subcutaneous  TID WC  . insulin aspart  0-5 Units Subcutaneous QHS  . insulin aspart  20 Units Subcutaneous TID WC  . insulin glargine  35 Units Subcutaneous BID  . ipratropium-albuterol  3 mL Nebulization TID  . mouth rinse  15 mL Mouth Rinse q12n4p  . metoprolol tartrate  12.5 mg Oral BID  . pantoprazole  40 mg Oral BID  . torsemide  30 mg Oral Daily   Continuous Infusions:   LOS: 3 days     Georgette Shell, MD [facility]  If 7PM-7AM, please contact night-coverage [email or web address] Password [facility] 06/24/2019, 10:52 AM

## 2019-06-24 NOTE — Progress Notes (Signed)
Occupational Therapy Treatment Patient Details Name: [name] MRN: [id number] DOB: 24-Nov-1940 Today's Date: 06/24/2019    History of present illness 78 y.o. male with history of COPD/chronic respiratory failure on 5-6 L, IDDM-2, CAD/CABG, AAA, diastolic CHF, HTN, AVM of GI tract, bipolar disorder/depression/anxiety and morbid obesity and admitted for Acute on chronic respiratory failure with hypercarbia due to COPD exacerbation   OT comments  Pt with good participation.  Pt pleasant and appropriate.  Follow Up Recommendations  Home health OT;Other (comment)(pt will likely refused, fired last therapist)    Equipment Recommendations  None recommended by OT    Recommendations for Other Services      Precautions / Restrictions Precautions Precautions: Fall Precaution Comments: chronic supplemental O2       Mobility Bed Mobility Overal bed mobility: Needs Assistance Bed Mobility: Supine to Sit;Sit to Supine     Supine to sit: Min guard Sit to supine: Min guard      Transfers Overall transfer level: Needs assistance Equipment used: Rolling walker (2 wheeled) Transfers: Sit to/from Stand Sit to Stand: Min guard         General transfer comment: min guard for safety, RW for steadying ( with urinal)    Balance Overall balance assessment: Needs assistance Sitting-balance support: Bilateral upper extremity supported Sitting balance-Leahy Scale: Good Sitting balance - Comments: sitting EOB without LOB   Standing balance support: Bilateral upper extremity supported;During functional activity Standing balance-Leahy Scale: Poor Standing balance comment: reliant on external support                           ADL either performed or assessed with clinical judgement   ADL Overall ADL's : Needs assistance/impaired                     Lower Body Dressing: Moderate assistance;Sit to/from stand;Cueing for safety;Cueing for compensatory techniques    Toilet Transfer: Minimal assistance;RW Toilet Transfer Details (indicate cue type and reason): standing using urinal Toileting- Clothing Manipulation and Hygiene: Minimal assistance;Sit to/from stand;Cueing for compensatory techniques;Cueing for safety       Functional mobility during ADLs: Minimal assistance General ADL Comments: pt stood EOB with urinal and did well. Pt pleasant and willing to participate.     Vision Baseline Vision/History: No visual deficits            Cognition Arousal/Alertness: Awake/alert Behavior During Therapy: WFL for tasks assessed/performed Overall Cognitive Status: Within Functional Limits for tasks assessed                                                     Pertinent Vitals/ Pain       Pain Assessment: No/denies pain         Frequency  Min 2X/week        Progress Toward Goals  OT Goals(current goals can now be found in the care plan section)  Progress towards OT goals: Progressing toward goals     Plan Discharge plan remains appropriate    Co-evaluation                 AM-PAC OT "6 Clicks" Daily Activity     Outcome Measure   Help from another person eating meals?: None Help from another person taking care of personal grooming?: A  Little Help from another person toileting, which includes using toliet, bedpan, or urinal?: A Little Help from another person bathing (including washing, rinsing, drying)?: A Little Help from another person to put on and taking off regular upper body clothing?: None Help from another person to put on and taking off regular lower body clothing?: A Little 6 Click Score: 20    End of Session Equipment Utilized During Treatment: Gait belt;Rolling walker;Oxygen  OT Visit Diagnosis: Unsteadiness on feet (R26.81);Other abnormalities of gait and mobility (R26.89)   Activity Tolerance Patient tolerated treatment well   Patient Left with call bell/phone within reach;in  bed   Nurse Communication Mobility status        Time: [id number] OT Time Calculation (min): 25 min  Charges: OT General Charges $OT Visit: 1 Visit OT Treatments $Self Care/Home Management : 23-37 mins  Kari Baars, [place] [phone] Office- [phone], Edwena Felty D 06/24/2019, 5:34 PM

## 2019-06-25 DIAGNOSIS — R509 Fever, unspecified: Secondary | ICD-10-CM

## 2019-06-25 LAB — GLUCOSE, CAPILLARY
Glucose-Capillary: 186 mg/dL — ABNORMAL HIGH (ref 70–99)
Glucose-Capillary: 218 mg/dL — ABNORMAL HIGH (ref 70–99)

## 2019-06-25 MED ORDER — GABAPENTIN 400 MG PO CAPS: 400.0000 mg | ORAL_CAPSULE | Freq: Three times a day (TID) | ORAL | 2 refills | Status: DC

## 2019-06-25 NOTE — Progress Notes (Signed)
Discharge and medication instructions reviewed with patient. Questions answered and patient  denies further questions. No prescriptions given to patient. Patient requested I call his wife. Call placed; no answer; voicemail left to please return call. Donne Hazel, RN

## 2019-06-25 NOTE — Care Management Important Message (Signed)
Important Message  Patient Details  Name: [name] MRN: [id number] Date of Birth: Jun 13, 1941   Medicare Important Message Given:  Yes. CMA printed out IM for Case Management Nurse or CSW to give to patient.      Taleeyah Bora 06/25/2019, 11:14 AM

## 2019-06-25 NOTE — Discharge Summary (Signed)
Physician Discharge Summary  BRYCIN CARRAWAY [id number] DOB: [birth year] DOA: 06/21/2019  PCP: [name], MD  Admit date: 06/21/2019 Discharge date: 06/25/2019  Admitted From: Home Disposition: Home  Recommendations for Outpatient Follow-up:  1. Follow up with PCP in 1-2 weeks 2. Please obtain BMP/CBC in one week 3 Please follow up with pulmonologist Dr. [place] Bells [facility] yes Equipment/Devices none  Discharge Condition: Stable CODE STATUS full code Diet recommendation: Cardiac carb modified Brief/Interim Summary:78 y.o.malewith history ofCOPD/chronic respiratory failure on4-5Lat home (patient is also supposed to be on BiPAP at night at home, but patient has not been very compliant), IDDM-2, CAD/CABG, AAA, diastolic CHF, HTN, AVM of GI tract, bipolar disorder/depression/anxietyandmorbid obesity. Patient was admitted with acute on chronic respiratory failure/COPD with exacerbation. Patient seems to be improving but not back to baseline. Discharge Diagnoses:  Active Problems:   Acute respiratory failure (HCC)   Hyperglycemia   #1 acute on chronic hypoxic hypercapnic respiratory failure due to COPD exacerbation.  Patient was initially treated with Rocephin and azithromycin and Solu-Medrol.  Repeat chest x-ray showed no evidence of active acute infiltrates he had chronic lung changes from COPD.  He is on 4 to 5 L of oxygen at home 24/7 which will be continued.  Continue outpatient nebulizer HFA treatments.  I have told him to follow-up with Dr. [place] Bells as an outpatient.  Patient also had difficulty with using BiPAP at home.  He reports he and his wife never understood how to use it.  I have discussed with case management again today to set him up with a right people who can teach him how to use BiPAP effectively at home so that he does not have to return back to the hospital as he is doing now.  His CO2 on admission was 78.9.  Patient tolerated BiPAP during the hospital stay even  though the first few days were hard for him.  However he understands that he needs to use the BiPAP at home to stay at home and not to come back to the hospital multiple times.  #2 type 2 diabetes continue Lantus and short acting insulin  #3 chronic diastolic CHF patient takes Lasix 60 mg at home which is continued.  #4 stage III CKD stable  #5 status post fall at home patient seen by PT and OT recommends home health PT and OT.  #6 acute metabolic encephalopathy from hypercapnia has been resolved. Estimated body mass index is 36.12 kg/m as calculated from the following:   Height as of this encounter: 6\' 3"  (1.905 m).   Weight as of this encounter: 131.1 kg.  Discharge Instructions  Discharge Instructions    Call MD for:  difficulty breathing, headache or visual disturbances   Complete by: As directed    Call MD for:  persistant nausea and vomiting   Complete by: As directed    Diet - low sodium heart healthy   Complete by: As directed    Increase activity slowly   Complete by: As directed      Allergies as of 06/25/2019      Reactions   Morphine And Related Shortness Of Breath, Nausea And Vomiting, Rash, Other (See Comments)   Reaction:  Hallucinations    Penicillins Anaphylaxis, Hives, Other (See Comments)   10/02/18 - discussed with patient and he states he can tolerate penicillin capsules and had some hives when he was 78yo and received pcn injections Has patient had a PCN reaction causing immediate rash, facial/tongue/throat swelling, SOB or lightheadedness  with hypotension: Yes Has patient had a PCN reaction causing severe rash involving mucus membranes or skin necrosis: No Has patient had a PCN reaction that required hospitalization No Has patient had a PCN reaction occurring within the last 10 y   Zolpidem Shortness Of Breath   Demerol [meperidine] Other (See Comments)   Reaction:  Hallucinations     Dilaudid [hydromorphone Hcl] Other (See Comments)   Reaction:   Hallucinations    Levofloxacin Other (See Comments)   Reaction:  Unknown       Medication List    STOP taking these medications   ferrous sulfate 325 (65 FE) MG tablet     TAKE these medications   acetaminophen 500 MG tablet Commonly known as: TYLENOL Take 1,000 mg by mouth every 6 (six) hours as needed for fever or headache (pain).   albuterol 108 (90 Base) MCG/ACT inhaler Commonly known as: VENTOLIN HFA Inhale 2 puffs into the lungs every 6 (six) hours as needed for wheezing or shortness of breath. What changed: Another medication with the same name was removed. Continue taking this medication, and follow the directions you see here.   ALPRAZolam 0.25 MG tablet Commonly known as: XANAX Take 1 tablet (0.25 mg total) by mouth 3 (three) times daily as needed for anxiety or sleep.   atorvastatin 10 MG tablet Commonly known as: LIPITOR Take 1 tablet (10 mg total) by mouth daily at 6 PM.   benzonatate 100 MG capsule Commonly known as: TESSALON Take 100 mg by mouth every 8 (eight) hours as needed for cough or congestion.   citalopram 40 MG tablet Commonly known as: CELEXA Take 0.5 tablets (20 mg total) by mouth daily. What changed: how much to take   [employer] Melatonin 5 MG Tabs Generic drug: Melatonin Take 5 mg by mouth at bedtime. For sleep   docusate sodium 100 MG capsule Commonly known as: COLACE Take 1 capsule (100 mg total) by mouth 2 (two) times daily.   Dulera 200-5 MCG/ACT Aero Generic drug: mometasone-formoterol Inhale 2 puffs into the lungs 2 (two) times daily as needed for wheezing or shortness of breath.   fluticasone 50 MCG/ACT nasal spray Commonly known as: FLONASE Place 2 sprays into both nostrils daily. What changed:   when to take this  reasons to take this   furosemide 20 MG tablet Commonly known as: LASIX Take 3 tablets (60 mg total) by mouth daily.   gabapentin 400 MG capsule Commonly known as: Neurontin Take 1 capsule (400 mg total) by  mouth 3 (three) times daily. What changed:   medication strength  how much to take  Another medication with the same name was removed. Continue taking this medication, and follow the directions you see here.   insulin aspart 100 UNIT/ML injection Commonly known as: novoLOG Inject 25 Units into the skin 3 (three) times daily with meals. What changed: how much to take   insulin glargine 100 UNIT/ML injection Commonly known as: LANTUS Inject 0.35 mLs (35 Units total) into the skin 2 (two) times daily. What changed: how much to take   ipratropium-albuterol 0.5-2.5 (3) MG/3ML Soln Commonly known as: DUONEB He is used 3 times daily for next 5 days, then 3 times daily as needed   lisinopril 2.5 MG tablet Commonly known as: ZESTRIL Take 1 tablet (2.5 mg total) by mouth daily.   loratadine 10 MG tablet Commonly known as: CLARITIN Take 1 tablet (10 mg total) by mouth daily.   metoprolol tartrate 25 MG tablet  Commonly known as: LOPRESSOR Take 0.5 tablets (12.5 mg total) by mouth 2 (two) times daily. What changed:   how much to take  when to take this   pantoprazole 40 MG tablet Commonly known as: PROTONIX Take 1 tablet (40 mg total) by mouth 2 (two) times daily.   propranolol 10 MG tablet Commonly known as: INDERAL Take 10 mg by mouth daily.   tiZANidine 2 MG tablet Commonly known as: ZANAFLEX Take 2 mg by mouth 2 (two) times daily as needed for muscle spasms.      Follow-up Information    [name], MD Follow up.   Specialty: Internal Medicine Contact information: [address] [place] [address] [phone]        Juanito Doom, MD Follow up.   Specialty: Pulmonary Disease Contact information: [place] [address] [phone]          Allergies  Allergen Reactions  . Morphine And Related Shortness Of Breath, Nausea And Vomiting, Rash and Other (See Comments)    Reaction:  Hallucinations   . Penicillins  Anaphylaxis, Hives and Other (See Comments)    10/02/18 - discussed with patient and he states he can tolerate penicillin capsules and had some hives when he was 78yo and received pcn injections  Has patient had a PCN reaction causing immediate rash, facial/tongue/throat swelling, SOB or lightheadedness with hypotension: Yes Has patient had a PCN reaction causing severe rash involving mucus membranes or skin necrosis: No Has patient had a PCN reaction that required hospitalization No Has patient had a PCN reaction occurring within the last 10 y  . Zolpidem Shortness Of Breath  . Demerol [Meperidine] Other (See Comments)    Reaction:  Hallucinations    . Dilaudid [Hydromorphone Hcl] Other (See Comments)    Reaction:  Hallucinations   . Levofloxacin Other (See Comments)    Reaction:  Unknown     Consultations: none  Procedures/Studies: Dg Chest 1 View  Result Date: 06/24/2019 CLINICAL DATA:  Cough. EXAM: CHEST  1 VIEW COMPARISON:  06/21/2019 and CT from 03/30/2019 FINDINGS: Cardiac silhouette remains prominent for size but unchanged. Evidence for median sternotomy and prior CABG procedure. Chronic densities in the retrocardiac space related to large hiatal hernia and limited evaluation of this area on this single AP view. Visualized lungs are similar to the previous examination with central vascular prominence. No frank pulmonary edema. IMPRESSION: Chronic densities at the left lung base that are poorly characterized on this examination. Some of these densities are related to the hiatal hernia. Slightly prominent central vascular structures are unchanged. No frank pulmonary edema. Electronically Signed   By: Markus Daft M.D.   On: 06/24/2019 12:19   Dg Chest Port 1 View  Result Date: 06/21/2019 CLINICAL DATA:  Shortness of breath EXAM: PORTABLE CHEST 1 VIEW COMPARISON:  Radiograph 05/05/2019, CT 03/30/2019 FINDINGS: Basilar predominant interstitial opacities with septal lines, central cuffing  and venous congestion. Increased attenuation in the retrocardiac space is likely accentuated by a large hiatal hernia seen on prior studies. Cannot exclude underlying consolidation. Postsurgical changes related to prior CABG including intact and aligned sternotomy wires and multiple surgical clips projecting over the mediastinum. No acute osseous or soft tissue abnormality. IMPRESSION: 1. Basilar predominant interstitial opacities, central cuffing and venous congestion, concerning for pulmonary edema. 2. Increased attenuation in the retrocardiac space is likely accentuated by a large hiatal hernia seen on prior studies. Cannot exclude underlying consolidation. Electronically Signed   By: March Rummage  [facility] M.D.   On: 06/21/2019 05:03    (Echo, Carotid, EGD, Colonoscopy, ERCP)    Subjective: Patient sitting by the edge of the bed and eating breakfast anxious to go home feels better  Discharge Exam: Vitals:   06/25/19 0546 06/25/19 0835  BP: (!) 110/57   Pulse: 85   Resp: 16   Temp:    SpO2: 96% 96%   Vitals:   06/24/19 1930 06/24/19 2110 06/25/19 0546 06/25/19 0835  BP:  117/88 (!) 110/57   Pulse:  97 85   Resp:  17 16   Temp:  98.6 F (37 C)    TempSrc:  Oral    SpO2: 96% 92% 96% 96%  Weight:      Height:        General: Pt is alert, awake, not in acute distress Cardiovascular: RRR, S1/S2 +, no rubs, no gallops Respiratory: CTA bilaterally, no wheezing, no rhonchi Abdominal: Soft, NT, ND, bowel sounds + Extremities: no edema, no cyanosis    The results of significant diagnostics from this hospitalization (including imaging, microbiology, ancillary and laboratory) are listed below for reference.     Microbiology: Recent Results (from the past 240 hour(s))  Blood Culture (routine x 2)     Status: None (Preliminary result)   Collection Time: 06/21/19  4:59 AM   Specimen: BLOOD  Result Value Ref Range Status   Specimen Description   Final    BLOOD RIGHT ARM Performed at [facility] [address] [place], [place] [address]    Special Requests   Final    BOTTLES DRAWN AEROBIC ONLY Blood Culture adequate volume Performed at [facility] [address], [place], [place] [address]    Culture   Final    NO GROWTH 4 DAYS Performed at [facility], [place] [address], [place], [place] [address]    Report Status PENDING  Incomplete  Blood Culture (routine x 2)     Status: None (Preliminary result)   Collection Time: 06/21/19  4:59 AM   Specimen: BLOOD  Result Value Ref Range Status   Specimen Description   Final    BLOOD LEFT ARM Performed at [facility] [address] [place], [place] [address]    Special Requests   Final    BOTTLES DRAWN AEROBIC AND ANAEROBIC Blood Culture results may not be optimal due to an excessive volume of blood received in culture bottles Performed at [facility] [address] [place], [place] [address]    Culture   Final    NO GROWTH 4 DAYS Performed at [facility], [place] [address] [place], [place] [address]    Report Status PENDING  Incomplete  Urine culture     Status: Abnormal   Collection Time: 06/21/19  4:59 AM   Specimen: In/Out Cath Urine  Result Value Ref Range Status   Specimen Description   Final    IN/OUT CATH URINE Performed at [facility] [address], [place], [place] [address]    Special Requests   Final    NONE Performed at [facility], [place] [address] [place], [place] [address]    Culture (A)  Final    <10,000 COLONIES/mL INSIGNIFICANT GROWTH Performed at [facility] [address], [place], [place] [address]    Report Status 06/22/2019 FINAL  Final  SARS Coronavirus 2 [facility] order, Performed in [facility] hospital lab) Nasopharyngeal  Nasopharyngeal Swab     Status: None   Collection Time: 06/21/19  4:59 AM   Specimen: Nasopharyngeal Swab  Result Value Ref  Range Status   SARS Coronavirus 2 NEGATIVE NEGATIVE Final    Comment: (NOTE) If result is NEGATIVE SARS-CoV-2 target nucleic acids are NOT DETECTED. The SARS-CoV-2 RNA is generally detectable in upper and lower  respiratory specimens during the acute phase of infection. The lowest  concentration of SARS-CoV-2 viral copies this assay can detect is 250  copies / mL. A negative result does not preclude SARS-CoV-2 infection  and should not be used as the sole basis for treatment or other  patient management decisions.  A negative result may occur with  improper specimen collection / handling, submission of specimen other  than nasopharyngeal swab, presence of viral mutation(s) within the  areas targeted by this assay, and inadequate number of viral copies  (<250 copies / mL). A negative result must be combined with clinical  observations, patient history, and epidemiological information. If result is POSITIVE SARS-CoV-2 target nucleic acids are DETECTED. The SARS-CoV-2 RNA is generally detectable in upper and lower  respiratory specimens dur ing the acute phase of infection.  Positive  results are indicative of active infection with SARS-CoV-2.  Clinical  correlation with patient history and other diagnostic information is  necessary to determine patient infection status.  Positive results do  not rule out bacterial infection or co-infection with other viruses. If result is PRESUMPTIVE POSTIVE SARS-CoV-2 nucleic acids MAY BE PRESENT.   A presumptive positive result was obtained on the submitted specimen  and confirmed on repeat testing.  While 2019 novel coronavirus  (SARS-CoV-2) nucleic acids may be present in the submitted sample  additional confirmatory testing may be necessary for epidemiological  and / or clinical management purposes  to differentiate between  SARS-CoV-2 and other Sarbecovirus currently known to infect humans.  If clinically indicated additional testing with an  alternate test  methodology [phone]) is advised. The SARS-CoV-2 RNA is generally  detectable in upper and lower respiratory sp ecimens during the acute  phase of infection. The expected result is Negative. Fact Sheet for Patients:  StrictlyIdeas.no Fact Sheet for Healthcare Providers: BankingDealers.co.za This test is not yet approved or cleared by the [place] FDA and has been authorized for detection and/or diagnosis of SARS-CoV-2 by FDA under an Emergency Use Authorization (EUA).  This EUA will remain in effect (meaning this test can be used) for the duration of the COVID-19 declaration under Section 564(b)(1) of the Act, 21 U.S.C. section 360bbb-3(b)(1), unless the authorization is terminated or revoked sooner. Performed at [facility], [place] [address] [place], [place] [address]   MRSA PCR Screening     Status: None   Collection Time: 06/22/19 12:24 AM   Specimen: Nasal Mucosa; Nasopharyngeal  Result Value Ref Range Status   MRSA by PCR NEGATIVE NEGATIVE Final    Comment:        The GeneXpert MRSA Assay (FDA approved for NASAL specimens only), is one component of a comprehensive MRSA colonization surveillance program. It is not intended to diagnose MRSA infection nor to guide or monitor treatment for MRSA infections. Performed at [facility], [place] [address], [place], [place] [address]      Labs: BNP (last 3 results) Recent Labs    02/28/19 0512 03/30/19 0933 06/21/19 0459  BNP 112.0* 212.9* [id number]   Basic Metabolic Panel: Recent Labs  Lab 06/21/19 0459 06/22/19 0214 06/23/19 0240  06/24/19 0212  NA 136 130* 130* 132*  K 4.5 4.9 4.5 3.9  CL 90* 87* 81* 84*  CO2 36* 31 33* 33*  GLUCOSE 250* 333* 281* 222*  BUN 21 24* 41* 53*  CREATININE 1.36* 1.44* 1.45* 1.49*  CALCIUM 8.4* 8.8* 9.1 9.1  MG  --   --  2.1 2.4  PHOS  --   --  3.7 3.9   Liver Function Tests: Recent  Labs  Lab 06/21/19 0459 06/23/19 0240 06/24/19 0212  AST 13*  --   --   ALT 12  --   --   ALKPHOS 147*  --   --   BILITOT 0.6  --   --   PROT 7.6  --   --   ALBUMIN 3.8 3.6 3.7   No results for input(s): LIPASE, AMYLASE in the last 168 hours. No results for input(s): AMMONIA in the last 168 hours. CBC: Recent Labs  Lab 06/21/19 0459 06/22/19 0214 06/23/19 0240 06/24/19 0212  WBC 10.4 7.3 10.4 9.9  NEUTROABS 7.8*  --  9.2* 8.0*  HGB 9.8* 9.2* 9.7* 10.3*  HCT 34.3* 31.3* 32.1* 33.8*  MCV 94.0 90.5 87.9 89.2  PLT 199 166 173 213   Cardiac Enzymes: No results for input(s): CKTOTAL, CKMB, CKMBINDEX, TROPONINI in the last 168 hours. BNP: Invalid input(s): POCBNP CBG: Recent Labs  Lab 06/24/19 0804 06/24/19 1155 06/24/19 1711 06/24/19 2129 06/25/19 0744  GLUCAP 188* 217* 147* 144* 186*   D-Dimer No results for input(s): DDIMER in the last 72 hours. Hgb A1c No results for input(s): HGBA1C in the last 72 hours. Lipid Profile No results for input(s): CHOL, HDL, LDLCALC, TRIG, CHOLHDL, LDLDIRECT in the last 72 hours. Thyroid function studies No results for input(s): TSH, T4TOTAL, T3FREE, THYROIDAB in the last 72 hours.  Invalid input(s): FREET3 Anemia work up No results for input(s): VITAMINB12, FOLATE, FERRITIN, TIBC, IRON, RETICCTPCT in the last 72 hours. Urinalysis    Component Value Date/Time   COLORURINE YELLOW 06/21/2019 0459   APPEARANCEUR HAZY (A) 06/21/2019 0459   LABSPEC 1.017 06/21/2019 0459   PHURINE 5.0 06/21/2019 0459   GLUCOSEU NEGATIVE 06/21/2019 0459   HGBUR NEGATIVE 06/21/2019 0459   BILIRUBINUR NEGATIVE 06/21/2019 0459   BILIRUBINUR small 02/13/2015 1354   KETONESUR NEGATIVE 06/21/2019 0459   PROTEINUR 30 (A) 06/21/2019 0459   UROBILINOGEN 0.2 02/13/2015 1354   UROBILINOGEN 0.2 08/24/2014 1948   NITRITE NEGATIVE 06/21/2019 0459   LEUKOCYTESUR TRACE (A) 06/21/2019 0459   Sepsis Labs Invalid input(s): PROCALCITONIN,  WBC,   LACTICIDVEN Microbiology Recent Results (from the past 240 hour(s))  Blood Culture (routine x 2)     Status: None (Preliminary result)   Collection Time: 06/21/19  4:59 AM   Specimen: BLOOD  Result Value Ref Range Status   Specimen Description   Final    BLOOD RIGHT ARM Performed at [facility], [place] [address] [place], [place] [address]    Special Requests   Final    BOTTLES DRAWN AEROBIC ONLY Blood Culture adequate volume Performed at [facility] [address], [place], [place] [address]    Culture   Final    NO GROWTH 4 DAYS Performed at [facility], [place] [address], [place], [place] [address]    Report Status PENDING  Incomplete  Blood Culture (routine x 2)     Status: None (Preliminary result)   Collection Time: 06/21/19  4:59 AM   Specimen: BLOOD  Result  Value Ref Range Status   Specimen Description   Final    BLOOD LEFT ARM Performed at [facility] [address] [place], [place] [address]    Special Requests   Final    BOTTLES DRAWN AEROBIC AND ANAEROBIC Blood Culture results may not be optimal due to an excessive volume of blood received in culture bottles Performed at [facility] [address], [place], [place] [address]    Culture   Final    NO GROWTH 4 DAYS Performed at [facility], [place] [address], [place], [place] [address]    Report Status PENDING  Incomplete  Urine culture     Status: Abnormal   Collection Time: 06/21/19  4:59 AM   Specimen: In/Out Cath Urine  Result Value Ref Range Status   Specimen Description   Final    IN/OUT CATH URINE Performed at [facility] [address], [place], [place] [address]    Special Requests   Final    NONE Performed at [facility], [place] [address], [place], [place] [address]    Culture (A)  Final    <10,000 COLONIES/mL INSIGNIFICANT GROWTH Performed at [facility] [place] [address], [place], [place] [address]    Report Status 06/22/2019 FINAL  Final  SARS Coronavirus 2 [facility] order, Performed in [facility] hospital lab) Nasopharyngeal Nasopharyngeal Swab     Status: None   Collection Time: 06/21/19  4:59 AM   Specimen: Nasopharyngeal Swab  Result Value Ref Range Status   SARS Coronavirus 2 NEGATIVE NEGATIVE Final    Comment: (NOTE) If result is NEGATIVE SARS-CoV-2 target nucleic acids are NOT DETECTED. The SARS-CoV-2 RNA is generally detectable in upper and lower  respiratory specimens during the acute phase of infection. The lowest  concentration of SARS-CoV-2 viral copies this assay can detect is 250  copies / mL. A negative result does not preclude SARS-CoV-2 infection  and should not be used as the sole basis for treatment or other  patient management decisions.  A negative result may occur with  improper specimen collection / handling, submission of specimen other  than nasopharyngeal swab, presence of viral mutation(s) within the  areas targeted by this assay, and inadequate number of viral copies  (<250 copies / mL). A negative result must be combined with clinical  observations, patient history, and epidemiological information. If result is POSITIVE SARS-CoV-2 target nucleic acids are DETECTED. The SARS-CoV-2 RNA is generally detectable in upper and lower  respiratory specimens dur ing the acute phase of infection.  Positive  results are indicative of active infection with SARS-CoV-2.  Clinical  correlation with patient history and other diagnostic information is  necessary to determine patient infection status.  Positive results do  not rule out bacterial infection or co-infection with other viruses. If result is PRESUMPTIVE POSTIVE SARS-CoV-2 nucleic acids MAY BE PRESENT.   A presumptive positive result was obtained on the submitted specimen  and confirmed on repeat testing.  While 2019 novel coronavirus  (SARS-CoV-2) nucleic acids may  be present in the submitted sample  additional confirmatory testing may be necessary for epidemiological  and / or clinical management purposes  to differentiate between  SARS-CoV-2 and other Sarbecovirus currently known to infect humans.  If clinically indicated additional testing with an alternate test  methodology [phone]) is advised. The SARS-CoV-2 RNA is generally  detectable in upper and lower respiratory sp ecimens during the acute  phase  of infection. The expected result is Negative. Fact Sheet for Patients:  StrictlyIdeas.no Fact Sheet for Healthcare Providers: BankingDealers.co.za This test is not yet approved or cleared by the [place] FDA and has been authorized for detection and/or diagnosis of SARS-CoV-2 by FDA under an Emergency Use Authorization (EUA).  This EUA will remain in effect (meaning this test can be used) for the duration of the COVID-19 declaration under Section 564(b)(1) of the Act, 21 U.S.C. section 360bbb-3(b)(1), unless the authorization is terminated or revoked sooner. Performed at [facility], [place] [address], [place], [place] [id number]   MRSA PCR Screening     Status: None   Collection Time: 06/22/19 12:24 AM   Specimen: Nasal Mucosa; Nasopharyngeal  Result Value Ref Range Status   MRSA by PCR NEGATIVE NEGATIVE Final    Comment:        The GeneXpert MRSA Assay (FDA approved for NASAL specimens only), is one component of a comprehensive MRSA colonization surveillance program. It is not intended to diagnose MRSA infection nor to guide or monitor treatment for MRSA infections. Performed at [facility], [place] [address], [place], [place] [address]      Time coordinating discharge:  37 minutes  SIGNED:   Georgette Shell, MD  [facility] 06/25/2019, 9:45 AM Pager   If 7PM-7AM, please contact night-coverage [email or web address] Password [email or web address]

## 2019-06-25 NOTE — TOC Progression Note (Addendum)
Transition of Care [facility]) - Progression Note    Patient Details  Name: [name] MRN: [id number] Date of Birth: 1940/12/13  Transition of Care [facility]) CM/SW Contact  Leeroy Cha, RN Phone Number: 06/25/2019, 11:17 AM  Clinical Narrative:    Spoke with patient about c-pap at home and not using.  States that he and his wife do not know how to run the machine.  Describes a ventilator with c-pap ability.  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 Zigmund Daniel is aware. Attempted to call the wife no answer. [facility]=kah for pt and ot       Expected Discharge Plan and Services           Expected Discharge Date: 06/25/19                                     Social Determinants of Health (SDOH) Interventions    Readmission Risk Interventions Readmission Risk Prevention Plan 04/13/2019  Transportation Screening Complete  Medication Review Press photographer) Complete  PCP or Specialist appointment within 3-5 days of discharge Complete  HRI or [place] Complete  SW Recovery Care/Counseling Consult Complete  Palliative Care Screening Patient [place] Patient Refused  Some recent data might be hidden

## 2019-06-26 LAB — CULTURE, BLOOD (ROUTINE X 2)
Culture: NO GROWTH
Culture: NO GROWTH
Special Requests: ADEQUATE

## 2019-07-05 ENCOUNTER — Emergency Department (HOSPITAL_COMMUNITY): Payer: Medicare HMO

## 2019-07-05 ENCOUNTER — Other Ambulatory Visit: Payer: Self-pay

## 2019-07-05 ENCOUNTER — Inpatient Hospital Stay (HOSPITAL_COMMUNITY)
Admission: EM | Admit: 2019-07-05 | Discharge: 2019-07-08 | DRG: 190 | Disposition: A | Discharge status: Home / Self Care | Payer: Medicare HMO | Attending: Internal Medicine | Admitting: Internal Medicine

## 2019-07-05 DIAGNOSIS — J9602 Acute respiratory failure with hypercapnia: Secondary | ICD-10-CM | POA: Diagnosis not present

## 2019-07-05 DIAGNOSIS — Z87891 Personal history of nicotine dependence: Secondary | ICD-10-CM

## 2019-07-05 DIAGNOSIS — J439 Emphysema, unspecified: Secondary | ICD-10-CM | POA: Diagnosis not present

## 2019-07-05 DIAGNOSIS — Z9981 Dependence on supplemental oxygen: Secondary | ICD-10-CM

## 2019-07-05 DIAGNOSIS — Z8711 Personal history of peptic ulcer disease: Secondary | ICD-10-CM

## 2019-07-05 DIAGNOSIS — I110 Hypertensive heart disease with heart failure: Secondary | ICD-10-CM | POA: Diagnosis present

## 2019-07-05 DIAGNOSIS — F419 Anxiety disorder, unspecified: Secondary | ICD-10-CM | POA: Diagnosis present

## 2019-07-05 DIAGNOSIS — J9621 Acute and chronic respiratory failure with hypoxia: Secondary | ICD-10-CM | POA: Diagnosis present

## 2019-07-05 DIAGNOSIS — Z8249 Family history of ischemic heart disease and other diseases of the circulatory system: Secondary | ICD-10-CM

## 2019-07-05 DIAGNOSIS — Z881 Allergy status to other antibiotic agents status: Secondary | ICD-10-CM

## 2019-07-05 DIAGNOSIS — I252 Old myocardial infarction: Secondary | ICD-10-CM

## 2019-07-05 DIAGNOSIS — F3162 Bipolar disorder, current episode mixed, moderate: Secondary | ICD-10-CM | POA: Diagnosis present

## 2019-07-05 DIAGNOSIS — Z880 Allergy status to penicillin: Secondary | ICD-10-CM

## 2019-07-05 DIAGNOSIS — Z6836 Body mass index (BMI) 36.0-36.9, adult: Secondary | ICD-10-CM

## 2019-07-05 DIAGNOSIS — Z951 Presence of aortocoronary bypass graft: Secondary | ICD-10-CM

## 2019-07-05 DIAGNOSIS — E785 Hyperlipidemia, unspecified: Secondary | ICD-10-CM | POA: Diagnosis present

## 2019-07-05 DIAGNOSIS — Z885 Allergy status to narcotic agent status: Secondary | ICD-10-CM

## 2019-07-05 DIAGNOSIS — Z79899 Other long term (current) drug therapy: Secondary | ICD-10-CM

## 2019-07-05 DIAGNOSIS — Z20828 Contact with and (suspected) exposure to other viral communicable diseases: Secondary | ICD-10-CM | POA: Diagnosis present

## 2019-07-05 DIAGNOSIS — K219 Gastro-esophageal reflux disease without esophagitis: Secondary | ICD-10-CM | POA: Diagnosis present

## 2019-07-05 DIAGNOSIS — J9601 Acute respiratory failure with hypoxia: Secondary | ICD-10-CM | POA: Diagnosis present

## 2019-07-05 DIAGNOSIS — I2510 Atherosclerotic heart disease of native coronary artery without angina pectoris: Secondary | ICD-10-CM | POA: Diagnosis present

## 2019-07-05 DIAGNOSIS — Z794 Long term (current) use of insulin: Secondary | ICD-10-CM

## 2019-07-05 DIAGNOSIS — E1140 Type 2 diabetes mellitus with diabetic neuropathy, unspecified: Secondary | ICD-10-CM | POA: Diagnosis present

## 2019-07-05 DIAGNOSIS — I5032 Chronic diastolic (congestive) heart failure: Secondary | ICD-10-CM | POA: Diagnosis present

## 2019-07-05 DIAGNOSIS — Z833 Family history of diabetes mellitus: Secondary | ICD-10-CM

## 2019-07-05 DIAGNOSIS — Z9119 Patient's noncompliance with other medical treatment and regimen: Secondary | ICD-10-CM

## 2019-07-05 DIAGNOSIS — J9622 Acute and chronic respiratory failure with hypercapnia: Secondary | ICD-10-CM | POA: Diagnosis present

## 2019-07-05 DIAGNOSIS — Z79890 Hormone replacement therapy (postmenopausal): Secondary | ICD-10-CM

## 2019-07-05 DIAGNOSIS — Z825 Family history of asthma and other chronic lower respiratory diseases: Secondary | ICD-10-CM

## 2019-07-05 DIAGNOSIS — I10 Essential (primary) hypertension: Secondary | ICD-10-CM | POA: Diagnosis present

## 2019-07-05 LAB — BASIC METABOLIC PANEL
Anion gap: 9 (ref 5–15)
BUN: 11 mg/dL (ref 8–23)
CO2: 37 mmol/L — ABNORMAL HIGH (ref 22–32)
Calcium: 8.8 mg/dL — ABNORMAL LOW (ref 8.9–10.3)
Chloride: 91 mmol/L — ABNORMAL LOW (ref 98–111)
Creatinine, Ser: 1.07 mg/dL (ref 0.61–1.24)
GFR calc Af Amer: >60 mL/min (ref >60)
GFR calc non Af Amer: >60 mL/min (ref >60)
Glucose, Bld: 171 mg/dL — ABNORMAL HIGH (ref 70–99)
Potassium: 5.3 mmol/L — ABNORMAL HIGH (ref 3.5–5.1)
Sodium: 137 mmol/L (ref 135–145)

## 2019-07-05 LAB — CBC
HCT: 35.5 % — ABNORMAL LOW (ref 39.0–52.0)
Hemoglobin: 10.1 g/dL — ABNORMAL LOW (ref 13.0–17.0)
MCH: 26.6 pg (ref 26.0–34.0)
MCHC: 28.5 g/dL — ABNORMAL LOW (ref 30.0–36.0)
MCV: 93.7 fL (ref 80.0–100.0)
Platelets: 185 10*3/uL (ref 150–400)
RBC: 3.79 MIL/uL — ABNORMAL LOW (ref 4.22–5.81)
RDW: 14.7 % (ref 11.5–15.5)
WBC: 10.1 10*3/uL (ref 4.0–10.5)
nRBC: 0 % (ref 0.0–0.2)

## 2019-07-05 LAB — BRAIN NATRIURETIC PEPTIDE: B Natriuretic Peptide: 455.1 pg/mL — ABNORMAL HIGH (ref 0.0–100.0)

## 2019-07-05 LAB — TROPONIN I (HIGH SENSITIVITY): Troponin I (High Sensitivity): 13 ng/L (ref <18)

## 2019-07-05 LAB — POTASSIUM: Potassium: 4.9 mmol/L (ref 3.5–5.1)

## 2019-07-05 MED ORDER — ALBUTEROL SULFATE HFA 108 (90 BASE) MCG/ACT IN AERS
6.0000 | INHALATION_SPRAY | Freq: Once | RESPIRATORY_TRACT | Status: AC
  Administered 2019-07-05: 6 via RESPIRATORY_TRACT
  Filled 2019-07-05: qty 6.7

## 2019-07-05 MED ORDER — METHYLPREDNISOLONE SODIUM SUCC 125 MG IJ SOLR
60.0000 mg | Freq: Once | INTRAMUSCULAR | Status: AC
  Administered 2019-07-05: 60 mg via INTRAVENOUS
  Filled 2019-07-05: qty 2

## 2019-07-05 NOTE — ED Provider Notes (Signed)
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 me on the following activities: development of treatment plan with patient and/or surrogate as well as nursing, discussions with consultants, evaluation of patient's response to treatment, examination of patient, obtaining history from patient or surrogate, ordering and performing treatments and interventions, ordering and review of laboratory studies, ordering and review of radiographic studies, pulse oximetry and re-evaluation of patient's condition.   Final Clinical Impressions(s) / ED Diagnoses   Final diagnoses:  Acute respiratory failure with hypercapnia [facility])    ED Discharge Orders    None       Ezequiel Essex, MD 07/06/19 [id number]

## 2019-07-05 NOTE — ED Triage Notes (Signed)
Arrived by [facility] from home with c/o [facility] over past 3 hours. [place] on home O2 4L/ [place]. EMS reports patient O2 Sat of 91% on home O@, diminshed lung sounds on left, and neck pain on both sides of neck. EMS initially place patient on NRB then back on [place]. Patient 98% on 4L/[place]. NAD

## 2019-07-06 ENCOUNTER — Encounter (HOSPITAL_COMMUNITY): Payer: Self-pay | Admitting: Internal Medicine

## 2019-07-06 DIAGNOSIS — Z6836 Body mass index (BMI) 36.0-36.9, adult: Secondary | ICD-10-CM | POA: Diagnosis not present

## 2019-07-06 DIAGNOSIS — F3162 Bipolar disorder, current episode mixed, moderate: Secondary | ICD-10-CM

## 2019-07-06 DIAGNOSIS — Z881 Allergy status to other antibiotic agents status: Secondary | ICD-10-CM | POA: Diagnosis not present

## 2019-07-06 DIAGNOSIS — Z825 Family history of asthma and other chronic lower respiratory diseases: Secondary | ICD-10-CM | POA: Diagnosis not present

## 2019-07-06 DIAGNOSIS — Z951 Presence of aortocoronary bypass graft: Secondary | ICD-10-CM | POA: Diagnosis not present

## 2019-07-06 DIAGNOSIS — I2510 Atherosclerotic heart disease of native coronary artery without angina pectoris: Secondary | ICD-10-CM | POA: Diagnosis present

## 2019-07-06 DIAGNOSIS — E1140 Type 2 diabetes mellitus with diabetic neuropathy, unspecified: Secondary | ICD-10-CM | POA: Diagnosis present

## 2019-07-06 DIAGNOSIS — Z885 Allergy status to narcotic agent status: Secondary | ICD-10-CM | POA: Diagnosis not present

## 2019-07-06 DIAGNOSIS — I110 Hypertensive heart disease with heart failure: Secondary | ICD-10-CM | POA: Diagnosis present

## 2019-07-06 DIAGNOSIS — J9622 Acute and chronic respiratory failure with hypercapnia: Secondary | ICD-10-CM | POA: Diagnosis present

## 2019-07-06 DIAGNOSIS — I5032 Chronic diastolic (congestive) heart failure: Secondary | ICD-10-CM | POA: Diagnosis present

## 2019-07-06 DIAGNOSIS — Z87891 Personal history of nicotine dependence: Secondary | ICD-10-CM | POA: Diagnosis not present

## 2019-07-06 DIAGNOSIS — Z880 Allergy status to penicillin: Secondary | ICD-10-CM | POA: Diagnosis not present

## 2019-07-06 DIAGNOSIS — J9601 Acute respiratory failure with hypoxia: Secondary | ICD-10-CM | POA: Diagnosis present

## 2019-07-06 DIAGNOSIS — Z8249 Family history of ischemic heart disease and other diseases of the circulatory system: Secondary | ICD-10-CM | POA: Diagnosis not present

## 2019-07-06 DIAGNOSIS — I10 Essential (primary) hypertension: Secondary | ICD-10-CM

## 2019-07-06 DIAGNOSIS — F419 Anxiety disorder, unspecified: Secondary | ICD-10-CM | POA: Diagnosis present

## 2019-07-06 DIAGNOSIS — E785 Hyperlipidemia, unspecified: Secondary | ICD-10-CM | POA: Diagnosis present

## 2019-07-06 DIAGNOSIS — Z9119 Patient's noncompliance with other medical treatment and regimen: Secondary | ICD-10-CM | POA: Diagnosis not present

## 2019-07-06 DIAGNOSIS — J9602 Acute respiratory failure with hypercapnia: Secondary | ICD-10-CM | POA: Diagnosis present

## 2019-07-06 DIAGNOSIS — J439 Emphysema, unspecified: Secondary | ICD-10-CM | POA: Diagnosis present

## 2019-07-06 DIAGNOSIS — I252 Old myocardial infarction: Secondary | ICD-10-CM | POA: Diagnosis not present

## 2019-07-06 DIAGNOSIS — Z20828 Contact with and (suspected) exposure to other viral communicable diseases: Secondary | ICD-10-CM | POA: Diagnosis present

## 2019-07-06 DIAGNOSIS — Z9981 Dependence on supplemental oxygen: Secondary | ICD-10-CM | POA: Diagnosis not present

## 2019-07-06 DIAGNOSIS — J9621 Acute and chronic respiratory failure with hypoxia: Secondary | ICD-10-CM | POA: Diagnosis present

## 2019-07-06 DIAGNOSIS — K219 Gastro-esophageal reflux disease without esophagitis: Secondary | ICD-10-CM | POA: Diagnosis present

## 2019-07-06 LAB — SARS CORONAVIRUS 2 BY RT PCR (HOSPITAL ORDER, PERFORMED IN ~~LOC~~ HOSPITAL LAB): SARS Coronavirus 2: NEGATIVE

## 2019-07-06 LAB — BLOOD GAS, ARTERIAL
Acid-Base Excess: 9.4 mmol/L — ABNORMAL HIGH (ref 0.0–2.0)
Acid-Base Excess: 13.5 mmol/L — ABNORMAL HIGH (ref 0.0–2.0)
Bicarbonate: 38.7 mmol/L — ABNORMAL HIGH (ref 20.0–28.0)
Bicarbonate: 41.3 mmol/L — ABNORMAL HIGH (ref 20.0–28.0)
Delivery systems: POSITIVE
Drawn by: 225631
Drawn by: 257701
Expiratory PAP: 7
FIO2: 32
Inspiratory PAP: 14
Mode: POSITIVE
O2 Content: 3 L/min
O2 Saturation: 97.2 %
O2 Saturation: 35.1 %
Patient temperature: 98.6
Patient temperature: 100.4
RATE: 18 resp/min
pCO2 arterial: 93.9 mmHg (ref 32.0–48.0)
pCO2 arterial: 80.2 mmHg (ref 32.0–48.0)
pH, Arterial: 7.239 — ABNORMAL LOW (ref 7.350–7.450)
pH, Arterial: 7.338 — ABNORMAL LOW (ref 7.350–7.450)
pO2, Arterial: 96.4 mmHg (ref 83.0–108.0)

## 2019-07-06 LAB — GLUCOSE, CAPILLARY
Glucose-Capillary: 304 mg/dL — ABNORMAL HIGH (ref 70–99)
Glucose-Capillary: 342 mg/dL — ABNORMAL HIGH (ref 70–99)

## 2019-07-06 LAB — COMPREHENSIVE METABOLIC PANEL
ALT: 17 U/L (ref 0–44)
AST: 11 U/L — ABNORMAL LOW (ref 15–41)
Albumin: 3.3 g/dL — ABNORMAL LOW (ref 3.5–5.0)
Alkaline Phosphatase: 120 U/L (ref 38–126)
Anion gap: 14 (ref 5–15)
BUN: 16 mg/dL (ref 8–23)
CO2: 37 mmol/L — ABNORMAL HIGH (ref 22–32)
Calcium: 9.2 mg/dL (ref 8.9–10.3)
Chloride: 85 mmol/L — ABNORMAL LOW (ref 98–111)
Creatinine, Ser: 1.11 mg/dL (ref 0.61–1.24)
GFR calc Af Amer: >60 mL/min (ref >60)
GFR calc non Af Amer: >60 mL/min (ref >60)
Glucose, Bld: 352 mg/dL — ABNORMAL HIGH (ref 70–99)
Potassium: 5 mmol/L (ref 3.5–5.1)
Sodium: 136 mmol/L (ref 135–145)
Total Bilirubin: 0.4 mg/dL (ref 0.3–1.2)
Total Protein: 7.2 g/dL (ref 6.5–8.1)

## 2019-07-06 LAB — CBC
HCT: 32.9 % — ABNORMAL LOW (ref 39.0–52.0)
Hemoglobin: 9.5 g/dL — ABNORMAL LOW (ref 13.0–17.0)
MCH: 26.3 pg (ref 26.0–34.0)
MCHC: 28.9 g/dL — ABNORMAL LOW (ref 30.0–36.0)
MCV: 91.1 fL (ref 80.0–100.0)
Platelets: 185 10*3/uL (ref 150–400)
RBC: 3.61 MIL/uL — ABNORMAL LOW (ref 4.22–5.81)
RDW: 14.5 % (ref 11.5–15.5)
WBC: 8.4 10*3/uL (ref 4.0–10.5)
nRBC: 0 % (ref 0.0–0.2)

## 2019-07-06 LAB — URINALYSIS, ROUTINE W REFLEX MICROSCOPIC
Bilirubin Urine: NEGATIVE
Glucose, UA: >=500 mg/dL — AB
Ketones, ur: 5 mg/dL — AB
Nitrite: NEGATIVE
Protein, ur: 100 mg/dL — AB
Specific Gravity, Urine: 1.01 (ref 1.005–1.030)
pH: 6 (ref 5.0–8.0)

## 2019-07-06 LAB — TROPONIN I (HIGH SENSITIVITY): Troponin I (High Sensitivity): 13 ng/L (ref <18)

## 2019-07-06 LAB — CBG MONITORING, ED
Glucose-Capillary: 280 mg/dL — ABNORMAL HIGH (ref 70–99)
Glucose-Capillary: 350 mg/dL — ABNORMAL HIGH (ref 70–99)
Glucose-Capillary: 318 mg/dL — ABNORMAL HIGH (ref 70–99)

## 2019-07-06 LAB — LACTIC ACID, PLASMA: Lactic Acid, Venous: 0.9 mmol/L (ref 0.5–1.9)

## 2019-07-06 MED ORDER — ACETAMINOPHEN 650 MG RE SUPP
650.0000 mg | Freq: Once | RECTAL | Status: AC
  Administered 2019-07-06: 650 mg via RECTAL
  Filled 2019-07-06: qty 1

## 2019-07-06 MED ORDER — ENOXAPARIN SODIUM 80 MG/0.8ML ~~LOC~~ SOLN
65.0000 mg | SUBCUTANEOUS | Status: DC
  Administered 2019-07-06 – 2019-07-07 (×2): 65 mg via SUBCUTANEOUS
  Filled 2019-07-06 (×2): qty 0.65

## 2019-07-06 MED ORDER — MELATONIN 5 MG PO TABS
5.0000 mg | ORAL_TABLET | Freq: Every day | ORAL | Status: DC
  Administered 2019-07-07: 23:00:00 5 mg via ORAL
  Filled 2019-07-06 (×3): qty 1

## 2019-07-06 MED ORDER — INSULIN ASPART 100 UNIT/ML ~~LOC~~ SOLN
25.0000 [IU] | Freq: Three times a day (TID) | SUBCUTANEOUS | Status: DC
  Administered 2019-07-07 – 2019-07-08 (×5): 25 [IU] via SUBCUTANEOUS

## 2019-07-06 MED ORDER — FLUTICASONE PROPIONATE 50 MCG/ACT NA SUSP
2.0000 | Freq: Every day | NASAL | Status: DC | PRN
  Filled 2019-07-06: qty 16

## 2019-07-06 MED ORDER — CITALOPRAM HYDROBROMIDE 20 MG PO TABS
40.0000 mg | ORAL_TABLET | Freq: Every day | ORAL | Status: DC
  Administered 2019-07-07 – 2019-07-08 (×2): 40 mg via ORAL
  Filled 2019-07-06 (×3): qty 2

## 2019-07-06 MED ORDER — ORAL CARE MOUTH RINSE
15.0000 mL | Freq: Two times a day (BID) | OROMUCOSAL | Status: DC
  Administered 2019-07-07 – 2019-07-08 (×3): 15 mL via OROMUCOSAL

## 2019-07-06 MED ORDER — CHLORHEXIDINE GLUCONATE 0.12 % MT SOLN
15.0000 mL | Freq: Two times a day (BID) | OROMUCOSAL | Status: DC
  Administered 2019-07-07 – 2019-07-08 (×2): 15 mL via OROMUCOSAL
  Filled 2019-07-06 (×5): qty 15

## 2019-07-06 MED ORDER — PANTOPRAZOLE SODIUM 40 MG PO TBEC
40.0000 mg | DELAYED_RELEASE_TABLET | Freq: Two times a day (BID) | ORAL | Status: DC
  Administered 2019-07-07 – 2019-07-08 (×3): 40 mg via ORAL
  Filled 2019-07-06 (×4): qty 1

## 2019-07-06 MED ORDER — ATORVASTATIN CALCIUM 10 MG PO TABS: 10.0000 mg | ORAL_TABLET | Freq: Every day | ORAL | Status: DC

## 2019-07-06 MED ORDER — ACETAMINOPHEN 650 MG RE SUPP: 650.0000 mg | Freq: Four times a day (QID) | RECTAL | Status: DC | PRN

## 2019-07-06 MED ORDER — TIZANIDINE HCL 4 MG PO TABS
2.0000 mg | ORAL_TABLET | Freq: Two times a day (BID) | ORAL | Status: DC | PRN
  Administered 2019-07-08: 2 mg via ORAL
  Filled 2019-07-06: qty 1

## 2019-07-06 MED ORDER — INSULIN ASPART 100 UNIT/ML ~~LOC~~ SOLN
0.0000 [IU] | Freq: Every day | SUBCUTANEOUS | Status: DC
  Administered 2019-07-06: 4 [IU] via SUBCUTANEOUS
  Administered 2019-07-06: 03:00:00 5 [IU] via SUBCUTANEOUS
  Administered 2019-07-07: 2 [IU] via SUBCUTANEOUS
  Filled 2019-07-06: qty 0.05

## 2019-07-06 MED ORDER — INSULIN GLARGINE 100 UNIT/ML ~~LOC~~ SOLN
35.0000 [IU] | Freq: Two times a day (BID) | SUBCUTANEOUS | Status: DC
  Administered 2019-07-06 – 2019-07-08 (×4): 35 [IU] via SUBCUTANEOUS
  Filled 2019-07-06 (×6): qty 0.35

## 2019-07-06 MED ORDER — CHLORHEXIDINE GLUCONATE CLOTH 2 % EX PADS
6.0000 | MEDICATED_PAD | Freq: Every day | CUTANEOUS | Status: DC
  Administered 2019-07-07 – 2019-07-08 (×2): 6 via TOPICAL

## 2019-07-06 MED ORDER — ZIPRASIDONE MESYLATE 20 MG IM SOLR
10.0000 mg | Freq: Once | INTRAMUSCULAR | Status: AC
  Administered 2019-07-06: 04:00:00 10 mg via INTRAMUSCULAR
  Filled 2019-07-06: qty 20

## 2019-07-06 MED ORDER — ALBUTEROL SULFATE (2.5 MG/3ML) 0.083% IN NEBU
3.0000 mL | INHALATION_SOLUTION | Freq: Four times a day (QID) | RESPIRATORY_TRACT | Status: DC
  Administered 2019-07-06 – 2019-07-08 (×9): 3 mL via RESPIRATORY_TRACT
  Filled 2019-07-06 (×9): qty 3

## 2019-07-06 MED ORDER — SODIUM CHLORIDE 0.9% FLUSH: 3.0000 mL | INTRAVENOUS | Status: DC | PRN

## 2019-07-06 MED ORDER — LORAZEPAM 2 MG/ML IJ SOLN
INTRAMUSCULAR | Status: AC
  Filled 2019-07-06: qty 1

## 2019-07-06 MED ORDER — STERILE WATER FOR INJECTION IJ SOLN
INTRAMUSCULAR | Status: AC
  Administered 2019-07-06: 1 mL
  Filled 2019-07-06: qty 10

## 2019-07-06 MED ORDER — MOMETASONE FURO-FORMOTEROL FUM 200-5 MCG/ACT IN AERO
2.0000 | INHALATION_SPRAY | Freq: Two times a day (BID) | RESPIRATORY_TRACT | Status: DC
  Administered 2019-07-06 – 2019-07-08 (×4): 2 via RESPIRATORY_TRACT
  Filled 2019-07-06: qty 8.8

## 2019-07-06 MED ORDER — METOPROLOL TARTRATE 25 MG PO TABS
12.5000 mg | ORAL_TABLET | Freq: Two times a day (BID) | ORAL | Status: DC
  Administered 2019-07-07 – 2019-07-08 (×3): 12.5 mg via ORAL
  Filled 2019-07-06 (×4): qty 1

## 2019-07-06 MED ORDER — METHYLPREDNISOLONE SODIUM SUCC 125 MG IJ SOLR
80.0000 mg | Freq: Three times a day (TID) | INTRAMUSCULAR | Status: DC
  Administered 2019-07-06 – 2019-07-08 (×7): 80 mg via INTRAVENOUS
  Filled 2019-07-06 (×7): qty 2

## 2019-07-06 MED ORDER — BENZONATATE 100 MG PO CAPS
100.0000 mg | ORAL_CAPSULE | Freq: Three times a day (TID) | ORAL | Status: DC | PRN
  Administered 2019-07-08: 100 mg via ORAL
  Filled 2019-07-06: qty 1

## 2019-07-06 MED ORDER — FUROSEMIDE 40 MG PO TABS
60.0000 mg | ORAL_TABLET | Freq: Every day | ORAL | Status: DC
  Administered 2019-07-07 – 2019-07-08 (×2): 60 mg via ORAL
  Filled 2019-07-06 (×2): qty 1

## 2019-07-06 MED ORDER — LISINOPRIL 5 MG PO TABS
2.5000 mg | ORAL_TABLET | Freq: Every day | ORAL | Status: DC
  Administered 2019-07-07 – 2019-07-08 (×2): 2.5 mg via ORAL
  Filled 2019-07-06 (×3): qty 1

## 2019-07-06 MED ORDER — SODIUM CHLORIDE 0.9 % IV SOLN: 250.0000 mL | INTRAVENOUS | Status: DC | PRN

## 2019-07-06 MED ORDER — LORAZEPAM 2 MG/ML IJ SOLN
1.0000 mg | Freq: Once | INTRAMUSCULAR | Status: AC
  Administered 2019-07-06: 1 mg via INTRAVENOUS
  Filled 2019-07-06: qty 1

## 2019-07-06 MED ORDER — LORAZEPAM 2 MG/ML IJ SOLN
1.0000 mg | Freq: Once | INTRAMUSCULAR | Status: AC
  Administered 2019-07-06: 1 mg via INTRAVENOUS

## 2019-07-06 MED ORDER — LOPERAMIDE HCL 2 MG PO CAPS: 2.0000 mg | ORAL_CAPSULE | Freq: Four times a day (QID) | ORAL | Status: DC | PRN

## 2019-07-06 MED ORDER — DOCUSATE SODIUM 100 MG PO CAPS: 100.0000 mg | ORAL_CAPSULE | Freq: Two times a day (BID) | ORAL | Status: DC | PRN

## 2019-07-06 MED ORDER — ALBUTEROL SULFATE (2.5 MG/3ML) 0.083% IN NEBU
3.0000 mL | INHALATION_SOLUTION | Freq: Four times a day (QID) | RESPIRATORY_TRACT | Status: DC | PRN
  Filled 2019-07-06: qty 3

## 2019-07-06 MED ORDER — SODIUM CHLORIDE 0.9% FLUSH
3.0000 mL | Freq: Two times a day (BID) | INTRAVENOUS | Status: DC
  Administered 2019-07-06 – 2019-07-08 (×4): 3 mL via INTRAVENOUS

## 2019-07-06 MED ORDER — INSULIN ASPART 100 UNIT/ML ~~LOC~~ SOLN
0.0000 [IU] | Freq: Three times a day (TID) | SUBCUTANEOUS | Status: DC
  Administered 2019-07-06 – 2019-07-07 (×4): 7 [IU] via SUBCUTANEOUS
  Administered 2019-07-07: 13:00:00 5 [IU] via SUBCUTANEOUS
  Administered 2019-07-07: 2 [IU] via SUBCUTANEOUS
  Administered 2019-07-08: 7 [IU] via SUBCUTANEOUS
  Administered 2019-07-08: 5 [IU] via SUBCUTANEOUS
  Filled 2019-07-06: qty 0.09

## 2019-07-06 MED ORDER — LORATADINE 10 MG PO TABS: 10.0000 mg | ORAL_TABLET | Freq: Every day | ORAL | Status: DC

## 2019-07-06 MED ORDER — GABAPENTIN 400 MG PO CAPS
400.0000 mg | ORAL_CAPSULE | Freq: Three times a day (TID) | ORAL | Status: DC
  Administered 2019-07-07 – 2019-07-08 (×4): 400 mg via ORAL
  Filled 2019-07-06 (×5): qty 1

## 2019-07-06 MED ORDER — UMECLIDINIUM BROMIDE 62.5 MCG/INH IN AEPB
1.0000 | INHALATION_SPRAY | Freq: Every day | RESPIRATORY_TRACT | Status: DC
  Administered 2019-07-06 – 2019-07-08 (×3): 1 via RESPIRATORY_TRACT
  Filled 2019-07-06: qty 7

## 2019-07-06 MED ORDER — SODIUM CHLORIDE 0.9 % IV SOLN
500.0000 mg | Freq: Every day | INTRAVENOUS | Status: DC
  Administered 2019-07-06 – 2019-07-08 (×3): 500 mg via INTRAVENOUS
  Filled 2019-07-06 (×3): qty 500

## 2019-07-06 MED ORDER — ACETAMINOPHEN 325 MG PO TABS
650.0000 mg | ORAL_TABLET | Freq: Four times a day (QID) | ORAL | Status: DC | PRN
  Administered 2019-07-08 (×2): 650 mg via ORAL
  Filled 2019-07-06 (×2): qty 2

## 2019-07-06 MED ORDER — FUROSEMIDE 10 MG/ML IJ SOLN
60.0000 mg | Freq: Once | INTRAMUSCULAR | Status: AC
  Administered 2019-07-06: 60 mg via INTRAVENOUS
  Filled 2019-07-06: qty 8

## 2019-07-06 NOTE — H&P (Signed)
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 THYROIDAB in the last 72 hours. Anemia Panel: No results for input(s): VITAMINB12, FOLATE, FERRITIN, TIBC, IRON, RETICCTPCT in the last 72 hours.  --------------------------------------------------------------------------------------------------------------- Urine analysis:    Component Value Date/Time   COLORURINE YELLOW 06/21/2019 0459   APPEARANCEUR HAZY (A) 06/21/2019 0459   LABSPEC 1.017 06/21/2019 0459   PHURINE 5.0  06/21/2019 0459   GLUCOSEU NEGATIVE 06/21/2019 0459   HGBUR NEGATIVE 06/21/2019 0459   BILIRUBINUR NEGATIVE 06/21/2019 0459   BILIRUBINUR small 02/13/2015 1354   KETONESUR NEGATIVE 06/21/2019 0459   PROTEINUR 30 (A) 06/21/2019 0459   UROBILINOGEN 0.2 02/13/2015 1354   UROBILINOGEN 0.2 08/24/2014 1948   NITRITE NEGATIVE 06/21/2019 0459   LEUKOCYTESUR TRACE (A) 06/21/2019 0459      Imaging Results:    Dg Chest 2 View  Result Date: 07/05/2019 CLINICAL DATA:  Shortness of breath EXAM: CHEST - 2 VIEW COMPARISON:  06/24/2019 FINDINGS: Cardiomegaly status post median sternotomy. Underpenetrated frontal view. Unchanged small bilateral pleural effusions, left greater than right. No new or focal airspace opacity. The visualized skeletal structures are unremarkable. IMPRESSION: Underpenetrated frontal view. Unchanged small bilateral pleural effusions, left greater than right. No new or focal airspace opacity. Cardiomegaly. Electronically Signed   By: Eddie Candle M.D.   On: 07/05/2019 21:26   ekg nsr at 39,  Nl axis, nl int, no st-t changes c/w ischemia   Assessment & Plan:    Principal Problem:   Acute respiratory failure with hypoxia and hypercapnia (HCC) Active Problems:   Hyperlipidemia   Essential hypertension   Chronic diastolic CHF (congestive heart failure) (HCC)   Bipolar 1 disorder, mixed, moderate (HCC)  Acute respiratory failure with hypoxia and hypercapnea Secondary to Copd exacerbation Solumedrol 80mg  iv q8h Zithromax 500mg  iv qday spiriva -> incruse 1puff qday DC Duoneb, (atrovent and spiriva together actually decrease lung function) Cont Dulera 2puff bid Albuterol HFA 2puff qid and q6h prn   Chronic diastolic chf Cont metoprolol 25mg  1/2 po bid  Cont Lisinopril 2.5mg  po qday Cont Lasix 60mg  po bid  Dm2 with neuropathy Cont fsbs ac and qhs, ISS Cont Lantus 35 units [place] qday Cont Gabapentin  Hyperlipidemia Cont Lipitor 10mg  po qhs  Anxiety  Cont  Celexa 20mg  po  qday Cont Xanax prn   Gerd Cont PPI     DVT Prophylaxis-   Lovenox - SCDs   AM Labs Ordered, also please review Full Orders  Family Communication: Admission, patients condition and plan of care including tests being ordered have been discussed with the patient who indicate understanding and agree with the plan and Code Status.  Code Status:  FULL CODE per patient, attempted to notify wife that pt will be admitted but not picking up phone, left message  Admission status: Inpatient: Based on patients clinical presentation and evaluation of above clinical data, I have made determination that patient meets Inpatient criteria at this time. Pt will require iv solumedrol and bipap for acute respiratory failure with hypoxia, and hypercapnea.  PCo2 >90,  Critically high.  Pt will require >2 nites stay.  Time spent in minutes : 70 minutes   Jani Gravel M.D on 07/06/2019 at 3:21 AM

## 2019-07-06 NOTE — Progress Notes (Addendum)
Pt making sexist comments about nursing staff stating we should have been born men and we are "only good for making babies"  then "sending them to school to die". Pt then goes on to say he is married to his 3rd wife.
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
## 2019-07-06 NOTE — ED Notes (Signed)
Respiratory at bedside.

## 2019-07-06 NOTE — Progress Notes (Signed)
During bedside shift report, pt making derogatory comments about all hospital staff, including nurses, doctors and cafeteria staff stating we are "picks and dumbasses". When asked what he was upset about he was unable to give examples. Charge RN notified and spoke with patient. Pt remained irate and disrespectful. Dinner tray at bedside and warmed for patient as requested as well as a cup of coffee. Pt requesting salad dressing, but unable to locate any on unit. Derogatory comments by patient continued.

## 2019-07-06 NOTE — ED Notes (Signed)
He was placed on a hospital bed by me at 0900 hours and remains so. The skin protection setting is ON continuously the entire time he has been on the hospital bed. Report called--will transport now.
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
## 2019-07-06 NOTE — ED Notes (Signed)
He is drowsy and in no distress. I am having difficulty drawing blood for testing. My colleague will attempt u/s-guided IV access shortly, at which time we will send blood work.

## 2019-07-06 NOTE — Progress Notes (Signed)
Received call from RN that PT has once again removed himself from the BiPAP. RT evaluated PT and PT does not appear to be in respiratory distress at this time.However,  PT states he is not  breathing well and RT encouraged placement back on BiPAP- PT aggressively states he is not going back on BiPAP. RN aware.

## 2019-07-06 NOTE — Progress Notes (Signed)
Pt refused all PO meds and BIPAP. Solumedrol and Insulin given without issue.

## 2019-07-06 NOTE — ED Notes (Signed)
He is now on O2 per cannula and is in no distress. He is very profane and antireligious in his ranting. Thus far, he is not threatening personnel.

## 2019-07-06 NOTE — ED Notes (Signed)
ED TO INPATIENT HANDOFF REPORT  Name/Age/Gender [name] 78 y.o. male  Code Status    Code Status Orders  (From admission, onward)         Start     Ordered   07/06/19 0133  Full code  Continuous     07/06/19 0135        Code Status History    Date Active Date Inactive Code Status Order ID Comments User Context   06/21/2019 0936 06/25/2019 1730 Full Code [id number]  Mercy Riding, MD ED   05/05/2019 1137 05/06/2019 2332 Full Code [id number]  Caren Griffins, MD Inpatient   04/27/2019 2033 04/28/2019 1819 Full Code [id number]  Lenore Cordia, MD ED   04/07/2019 1229 04/13/2019 1645 DNR [id number]  Wilhelmina Mcardle, MD Inpatient   04/06/2019 1048 04/07/2019 1229 Full Code [id number]  Lang Snow, NP ED   02/28/2019 0924 03/05/2019 2017 Full Code [id number]  Deatra James, MD ED   12/26/2018 1059 12/31/2018 2100 Full Code [id number]  Nita Sells, MD Inpatient   10/11/2018 2353 10/14/2018 1912 Full Code [id number]  Etta Quill, DO ED   10/02/2018 0309 10/06/2018 1741 Full Code [id number]  Vianne Bulls, MD ED   02/08/2018 1042 02/11/2018 1624 Full Code [id number]  Velvet Bathe, MD Inpatient   01/28/2018 0641 01/30/2018 2132 Full Code [id number]  Arta Silence, MD Inpatient   01/18/2018 2225 01/20/2018 2210 Full Code [id number]  Lance Coon, MD Inpatient   06/28/2017 1203 06/30/2017 1554 Full Code [id number]  Rondel Jumbo, PA-C ED   05/08/2017 1341 05/11/2017 2127 Full Code [id number]  Waldemar Dickens, MD ED   04/03/2017 1715 04/10/2017 1745 Full Code [id number]  Waldemar Dickens, MD ED   03/30/2017 1927 03/31/2017 1928 Full Code [id number]  Bonnielee Haff, MD Inpatient   02/22/2017 1614 02/24/2017 1829 Full Code [id number]  Fritzi Mandes, MD Inpatient   11/07/2016 1020 11/09/2016 1825 Full Code [id number]  Saundra Shelling, MD Inpatient   10/21/2016 0414 10/25/2016 1503 Full Code [id number]  [place], Fitzhugh, DO Inpatient   10/09/2016 1152 10/12/2016 1649 Full Code [id number]  Hower,  Aaron Mose, MD ED   10/09/2016 1105 10/09/2016 1152 DNR [id number]  Lytle Butte, MD ED   07/24/2016 0806 07/27/2016 1759 DNR [id number]  Harrie Foreman, MD Inpatient   02/10/2016 1528 02/12/2016 2009 DNR [id number]  Loletha Grayer, MD ED   10/26/2015 0314 11/01/2015 2005 Full Code [id number]  Saundra Shelling, MD Inpatient   08/20/2015 0441 08/22/2015 1649 Full Code [id number]  Lytle Butte, MD ED   08/20/2015 0254 08/20/2015 0441 Full Code [id number]  Lytle Butte, MD ED   07/27/2015 2040 07/29/2015 1829 Full Code [id number]  Lytle Butte, MD ED   06/30/2015 1240 07/03/2015 1526 Full Code [id number]  Bettey Costa, MD Inpatient   05/19/2015 1459 05/23/2015 1621 Full Code [id number]  Max Sane, MD Inpatient   05/06/2015 1714 05/13/2015 1706 Full Code [id number]  Epifanio Lesches, MD ED   05/03/2015 1229 05/04/2015 1920 Full Code [id number]  Idelle Crouch, MD Inpatient   04/22/2015 0450 04/27/2015 1935 Full Code [id number]  Harrie Foreman, MD Inpatient   04/17/2015 1916 04/19/2015 1716 Full Code [id number]  Clovis Fredrickson, MD Inpatient   04/10/2015 0912 04/17/2015 1908 Full Code [id number]  Demetrios Loll, MD Inpatient   03/09/2015 1218 03/12/2015 1845 Full Code [id number]  Henreitta Leber, MD Inpatient   08/24/2014 2141 08/25/2014 1720 Full Code [id number]  Ivor Costa, MD  ED   04/02/2014 2011 04/04/2014 1710 Full Code [id number]  Velvet Bathe, MD Inpatient   03/18/2014 1544 03/22/2014 1406 Full Code [id number]  Verlee [place], MD Inpatient   12/04/2013 0613 12/10/2013 1954 Full Code [id number]  Rise Patience, MD Inpatient   10/15/2013 2011 10/18/2013 1840 Full Code [id number]  Fuller Plan, MD Inpatient   09/08/2013 0043 09/10/2013 1530 Full Code [id number]  Toy Baker, MD Inpatient   08/19/2013 1748 08/22/2013 1551 Full Code [id number]  Orson Eva, MD ED   05/15/2013 0126 05/17/2013 1659 Full Code [id number]  Theodis Blaze, MD Inpatient   12/04/2012 1723 12/08/2012 1656 Full Code [id number]  Janece Canterbury, MD Inpatient    05/17/2012 1256 05/21/2012 1809 Full Code [id number]  Campbell Lerner, RN ED   04/06/2012 1935 04/14/2012 1723 Full Code [id number]  Marylou Mccoy, RN Inpatient   03/27/2012 0444 03/28/2012 1756 Full Code [id number]  Theotis Barrio, RN Inpatient   Advance Care Planning Activity      Home/SNF/Other Home  Chief Complaint SOB  Level of Care/Admitting Diagnosis ED Disposition    ED Disposition Condition [place] [facility]: [facility] [[id number]]  Level of Care: Stepdown [14]  Admit to SDU based on following criteria: Respiratory Distress:  Frequent assessment and/or intervention to maintain adequate ventilation/respiration, pulmonary toilet, and respiratory treatment.  Covid Evaluation: Asymptomatic Screening Protocol (No Symptoms)  Diagnosis: Acute respiratory failure with hypoxia and [facility]capnia [id number]  Admitting Physician: Jani Gravel [place]  Attending Physician: Jani Gravel [phone]  Estimated length of stay: past midnight tomorrow  Certification:: I certify this patient will need inpatient services for at least 2 midnights  PT Class (Do Not Modify): Inpatient [101]  PT Acc Code (Do Not Modify): Private [1]       Medical History Past Medical History:  Diagnosis Date  . AAA (abdominal aortic aneurysm) ([place])    a. 12/2008 s/p 7cm, endovascular repair with coiling right hypogastric artery   . Acute [place] ulcer   . Adenomatous duodenal polyp   . Allergic rhinitis, cause unspecified   . Anxiety   . AVM (arteriovenous malformation) of colon with hemorrhage   . Bipolar 1 disorder, mixed, moderate ([place]) 04/16/2015  . CAD (coronary artery disease)    a. 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  . Chronic diastolic CHF (congestive heart failure) ([place])    a. 04/2015 Echo: EF 55-60%, no rwma, Gr 1 DD, mild AI.  Marland Kitchen Complication of anesthesia    "if they sedate me for too long, they have to intubate me; then they can't get me  to come out of it" (04/03/2017)  . COPD (chronic obstructive pulmonary disease) ([place])    a. GOLD stage IV, started home O2. Severe bullous disease of LUL. Prolonged intubation after surgeries due to COPD.  Marland Kitchen Depression with anxiety 01/14/2013  . Diabetes mellitus with complication ([place])   . Diverticulosis   . Duodenal diverticulum   . Duodenal ulcer   . Emphysema of lung ([place])   . Esophagitis   . Essential hypertension 08/18/2009   Qualifier: Diagnosis of  By: Doy Mince LPN, Megan    . GERD (gastroesophageal reflux disease)   . GI bleed requiring more than 4 units of blood in 24 hours, ICU, or surgery    a. Hx bleeding gastric polyps, cecal & sigmoid AVMS s/p APC 03/30/14  . Hiatal hernia    large  . History of blood transfusion    "  many many many; related to blood loss; anemia"  . Hyperlipidemia   . Insomnia 08/10/2014  . Leucocytosis 12/04/2013  . Major depressive disorder   . Memory loss   . Morbid obesity ([place])   . Multiple gastric polyps   . Myocardial infarction ([place])    "I think I had a minor one when I had the OHS"  . On home oxygen therapy    "7 liters [place] w/oxigenator" (04/03/2017)  . Pneumonia 2017  . Recurrent Microcytic Anemia    a. presumed chronic GI blood loss.  . Type II diabetes mellitus ([place])   . Vitamin D deficiency 08/10/2014    Allergies Allergies  Allergen Reactions  . Morphine And Related Shortness Of Breath, Nausea And Vomiting, Rash and Other (See Comments)    Reaction:  Hallucinations   . Penicillins Anaphylaxis, Hives and Other (See Comments)    10/02/18 - discussed with patient and he states he can tolerate penicillin capsules and had some hives when he was 78yo and received pcn injections  Has patient had a PCN reaction causing immediate rash, facial/tongue/throat swelling, SOB or lightheadedness with hypotension: Yes Has patient had a PCN reaction causing severe rash involving mucus membranes or skin necrosis: No Has patient had a PCN reaction  that required hospitalization No Has patient had a PCN reaction occurring within the last 10 y  . Zolpidem Shortness Of Breath  . Demerol [Meperidine] Other (See Comments)    Reaction:  Hallucinations    . Dilaudid [Hydromorphone Hcl] Other (See Comments)    Reaction:  Hallucinations   . Levofloxacin Other (See Comments)    Reaction:  Unknown     IV Location/Drains/Wounds Patient Lines/Drains/Airways Status   Active Line/Drains/Airways    Name:   Placement date:   Placement time:   Site:   Days:   Peripheral IV 07/05/19 Left Antecubital   07/05/19    2210    Antecubital   1   Urethral Catheter Horris Latino, EMT Temperature probe 16 Fr.   07/06/19    0130    Temperature probe   less than 1   External Urinary Catheter   06/22/19    1256    -   14          Labs/Imaging Results for orders placed or performed during the hospital encounter of 07/05/19 (from the past 48 hour(s))  SARS Coronavirus 2 [facility] order, Performed in [place] hospital lab)     Status: None   Collection Time: 07/05/19  7:43 PM  Result Value Ref Range   SARS Coronavirus 2 NEGATIVE NEGATIVE    Comment: (NOTE) If result is NEGATIVE SARS-CoV-2 target nucleic acids are NOT DETECTED. The SARS-CoV-2 RNA is generally detectable in upper and lower  respiratory specimens during the acute phase of infection. The lowest  concentration of SARS-CoV-2 viral copies this assay can detect is 250  copies / mL. A negative result does not preclude SARS-CoV-2 infection  and should not be used as the sole basis for treatment or other  patient management decisions.  A negative result may occur with  improper specimen collection / handling, submission of specimen other  than nasopharyngeal swab, presence of viral mutation(s) within the  areas targeted by this assay, and inadequate number of viral copies  (<250 copies / mL). A negative result must be combined with clinical  observations, patient history, and epidemiological  information. If result is POSITIVE SARS-CoV-2 target nucleic acids are DETECTED. The SARS-CoV-2 RNA is generally detectable  in upper and lower  respiratory specimens dur ing the acute phase of infection.  Positive  results are indicative of active infection with SARS-CoV-2.  Clinical  correlation with patient history and other diagnostic information is  necessary to determine patient infection status.  Positive results do  not rule out bacterial infection or co-infection with other viruses. If result is PRESUMPTIVE POSTIVE SARS-CoV-2 nucleic acids MAY BE PRESENT.   A presumptive positive result was obtained on the submitted specimen  and confirmed on repeat testing.  While 2019 novel coronavirus  (SARS-CoV-2) nucleic acids may be present in the submitted sample  additional confirmatory testing may be necessary for epidemiological  and / or clinical management purposes  to differentiate between  SARS-CoV-2 and other Sarbecovirus currently known to infect humans.  If clinically indicated additional testing with an alternate test  methodology [phone]) is advised. The SARS-CoV-2 RNA is generally  detectable in upper and lower respiratory sp ecimens during the acute  phase of infection. The expected result is Negative. Fact Sheet for Patients:  StrictlyIdeas.no Fact Sheet for Healthcare Providers: BankingDealers.co.za This test is not yet approved or cleared by the [place] FDA and has been authorized for detection and/or diagnosis of SARS-CoV-2 by FDA under an Emergency Use Authorization (EUA).  This EUA will remain in effect (meaning this test can be used) for the duration of the COVID-19 declaration under Section 564(b)(1) of the Act, 21 U.S.C. section 360bbb-3(b)(1), unless the authorization is terminated or revoked sooner. Performed at [facility], [place] [address], [place], [place] [id number]   Basic metabolic  panel     Status: Abnormal   Collection Time: 07/05/19  7:53 PM  Result Value Ref Range   Sodium 137 135 - 145 mmol/L   Potassium 5.3 (H) 3.5 - 5.1 mmol/L   Chloride 91 (L) 98 - 111 mmol/L   CO2 37 (H) 22 - 32 mmol/L   Glucose, Bld 171 (H) 70 - 99 mg/dL   BUN 11 8 - 23 mg/dL   Creatinine, Ser 1.07 0.61 - 1.24 mg/dL   Calcium 8.8 (L) 8.9 - 10.3 mg/dL   GFR calc non Af Amer >60 >60 mL/min   GFR calc Af Amer >60 >60 mL/min   Anion gap 9 5 - 15    Comment: Performed at [facility], [place] [address] [place], [place] [address]  CBC     Status: Abnormal   Collection Time: 07/05/19  7:53 PM  Result Value Ref Range   WBC 10.1 4.0 - 10.5 K/uL   RBC 3.79 (L) 4.22 - 5.81 MIL/uL   Hemoglobin 10.1 (L) 13.0 - 17.0 g/dL   HCT 35.5 (L) 39.0 - 52.0 %   MCV 93.7 80.0 - 100.0 fL   MCH 26.6 26.0 - 34.0 pg   MCHC 28.5 (L) 30.0 - 36.0 g/dL   RDW 14.7 11.5 - 15.5 %   Platelets 185 150 - 400 K/uL   nRBC 0.0 0.0 - 0.2 %    Comment: Performed at [facility], [place] [address], [place], [place] [address]  Troponin I (High Sensitivity)     Status: None   Collection Time: 07/05/19  7:53 PM  Result Value Ref Range   Troponin I (High Sensitivity) 13 <18 ng/L    Comment: (NOTE) Elevated high sensitivity troponin I (hsTnI) values and significant  changes across serial measurements may suggest ACS but many other  chronic and acute conditions are known to elevate hsTnI results.  Refer to the "Links" section for chest pain algorithms and additional  guidance. Performed at [facility], [place] [address], [place], [place] [address]   Brain natriuretic peptide     Status: Abnormal   Collection Time: 07/05/19  7:53 PM  Result Value Ref Range   B Natriuretic Peptide 455.1 (H) 0.0 - 100.0 pg/mL    Comment: Performed at [facility], [place] [address], [place], [place] [address]  Lactic acid, plasma     Status: None   Collection Time: 07/05/19  10:21 PM  Result Value Ref Range   Lactic Acid, Venous 0.9 0.5 - 1.9 mmol/L    Comment: Performed at [facility], [place] [address] [place], [place] [address]  Potassium     Status: None   Collection Time: 07/05/19 10:42 PM  Result Value Ref Range   Potassium 4.9 3.5 - 5.1 mmol/L    Comment: Performed at [facility], [place] [address] [place], [place] [address]  Blood gas, arterial (at [facility])     Status: Abnormal   Collection Time: 07/05/19 10:55 PM  Result Value Ref Range   O2 Content 3.0 L/min   Delivery systems NASAL CANNULA    pH, Arterial 7.239 (L) 7.350 - 7.450   pCO2 arterial 93.9 (HH) 32.0 - 48.0 mmHg    Comment: CRITICAL RESULT CALLED TO, READ BACK BY AND VERIFIED WITH: DR. Wyvonnia Dusky MD AT 2309 BY PATRICK SWEENEY RRT, RCP ON 07/05/2019    pO2, Arterial 96.4 83.0 - 108.0 mmHg   Bicarbonate 38.7 (H) 20.0 - 28.0 mmol/L   Acid-Base Excess 9.4 (H) 0.0 - 2.0 mmol/L   O2 Saturation 97.2 %   Patient temperature 98.6    Collection site RIGHT RADIAL    Drawn by [id number]    Sample type ARTERIAL DRAW    Allens test (pass/fail) PASS PASS    Comment: Performed at [facility], [place] [address] [place], [place] [address]  Troponin I (High Sensitivity)     Status: None   Collection Time: 07/06/19 12:17 AM  Result Value Ref Range   Troponin I (High Sensitivity) 13 <18 ng/L    Comment: (NOTE) Elevated high sensitivity troponin I (hsTnI) values and significant  changes across serial measurements may suggest ACS but many other  chronic and acute conditions are known to elevate hsTnI results.  Refer to the "Links" section for chest pain algorithms and additional  guidance. Performed at [facility], [place] [address] [place], [place] [address]   CBG monitoring, ED     Status: Abnormal   Collection Time: 07/06/19  2:53 AM  Result Value Ref Range   Glucose-Capillary 280 (H) 70 - 99 mg/dL  Blood gas, arterial (at [facility])     Status: Abnormal   Collection Time: 07/06/19  7:18 AM  Result Value Ref Range   FIO2 32.00    Delivery systems BILEVEL POSITIVE AIRWAY PRESSURE    Mode BILEVEL POSITIVE AIRWAY PRESSURE    LHR 18 resp/min   Inspiratory PAP 14.0    Expiratory PAP 7.0    pH, Arterial 7.338 (L) 7.350 - 7.450   pCO2 arterial 80.2 (HH) 32.0 - 48.0 mmHg    Comment: CRITICAL RESULT CALLED TO, READ BACK BY AND VERIFIED WITH: MD RANCOUR AT 0731 BY DEE WALTERS RRT ON 07/06/19    pO2, Arterial BELOW REPORTABLE RANGE. 83.0 - 108.0 mmHg    Comment: MD RANCOUR AT [phone]  BY DEE WALTERS RRT ON 07/06/19   Bicarbonate 41.3 (H) 20.0 - 28.0 mmol/L   Acid-Base Excess 13.5 (H) 0.0 - 2.0 mmol/L   O2 Saturation 35.1 %   Patient temperature 100.4    Collection site RIGHT RADIAL    Drawn by [id number]    Sample type ARTERIAL DRAW    Allens test (pass/fail) PASS PASS    Comment: Performed at [facility], [place] [address], [place], [place] [address]  Urinalysis, Routine w reflex microscopic     Status: Abnormal   Collection Time: 07/06/19  8:30 AM  Result Value Ref Range   Color, Urine YELLOW YELLOW   APPearance CLEAR CLEAR   Specific Gravity, Urine 1.010 1.005 - 1.030   pH 6.0 5.0 - 8.0   Glucose, UA >=500 (A) NEGATIVE mg/dL   Hgb urine dipstick SMALL (A) NEGATIVE   Bilirubin Urine NEGATIVE NEGATIVE   Ketones, ur 5 (A) NEGATIVE mg/dL   Protein, ur 100 (A) NEGATIVE mg/dL   Nitrite NEGATIVE NEGATIVE   Leukocytes,Ua MODERATE (A) NEGATIVE   RBC / HPF 11-20 0 - 5 RBC/hpf   WBC, UA 21-50 0 - 5 WBC/hpf   Bacteria, UA RARE (A) NONE SEEN   Squamous Epithelial / LPF 0-5 0 - 5    Comment: Performed at [facility], [place] [address] [place] [address], [place], [place] [address]  Comprehensive metabolic panel     Status: Abnormal   Collection Time: 07/06/19  8:30 AM  Result Value Ref Range   Sodium 136 135 - 145 mmol/L   Potassium 5.0 3.5 - 5.1 mmol/L   Chloride 85 (L) 98 - 111 mmol/L   CO2 37 (H) 22 - 32  mmol/L   Glucose, Bld 352 (H) 70 - 99 mg/dL   BUN 16 8 - 23 mg/dL   Creatinine, Ser 1.11 0.61 - 1.24 mg/dL   Calcium 9.2 8.9 - 10.3 mg/dL   Total Protein 7.2 6.5 - 8.1 g/dL   Albumin 3.3 (L) 3.5 - 5.0 g/dL   AST 11 (L) 15 - 41 U/L   ALT 17 0 - 44 U/L   Alkaline Phosphatase 120 38 - 126 U/L   Total Bilirubin 0.4 0.3 - 1.2 mg/dL   GFR calc non Af Amer >60 >60 mL/min   GFR calc Af Amer >60 >60 mL/min   Anion gap 14 5 - 15    Comment: Performed at [facility], [place] [address], [place], [place] [address]  CBC     Status: Abnormal   Collection Time: 07/06/19  8:30 AM  Result Value Ref Range   WBC 8.4 4.0 - 10.5 K/uL   RBC 3.61 (L) 4.22 - 5.81 MIL/uL   Hemoglobin 9.5 (L) 13.0 - 17.0 g/dL   HCT 32.9 (L) 39.0 - 52.0 %   MCV 91.1 80.0 - 100.0 fL   MCH 26.3 26.0 - 34.0 pg   MCHC 28.9 (L) 30.0 - 36.0 g/dL   RDW 14.5 11.5 - 15.5 %   Platelets 185 150 - 400 K/uL   nRBC 0.0 0.0 - 0.2 %    Comment: Performed at [facility], [place] [address] [place], [place] [address]  CBG monitoring, ED     Status: Abnormal   Collection Time: 07/06/19  8:43 AM  Result Value Ref Range   Glucose-Capillary 350 (H) 70 - 99 mg/dL  CBG monitoring, ED     Status: Abnormal   Collection Time: 07/06/19 12:22 PM  Result Value  Ref Range   Glucose-Capillary 318 (H) 70 - 99 mg/dL   Dg Chest 2 View  Result Date: 07/05/2019 CLINICAL DATA:  Shortness of breath EXAM: CHEST - 2 VIEW COMPARISON:  06/24/2019 FINDINGS: Cardiomegaly status post median sternotomy. Underpenetrated frontal view. Unchanged small bilateral pleural effusions, left greater than right. No new or focal airspace opacity. The visualized skeletal structures are unremarkable. IMPRESSION: Underpenetrated frontal view. Unchanged small bilateral pleural effusions, left greater than right. No new or focal airspace opacity. Cardiomegaly. Electronically Signed   By: Eddie Candle M.D.   On: 07/05/2019 21:26    Pending  Labs Unresulted Labs (From admission, onward)    Start     Ordered   07/13/19 0500  Creatinine, serum  (enoxaparin (LOVENOX)    CrCl >/= 30 ml/min)  Weekly,   R    Comments: while on enoxaparin therapy    07/06/19 0135   07/05/19 2221  Blood culture (routine x 2)  BLOOD CULTURE X 2,   STAT     07/05/19 2221          Vitals/Pain Today's Vitals   07/06/19 1400 07/06/19 1401 07/06/19 1500 07/06/19 1530  BP:   (!) 143/70 (!) 130/55  Pulse: 96 98 90 85  Resp: (!) 21 17 18 18   Temp: 98.6 F (37 C) 98.8 F (37.1 C) 98.6 F (37 C) 98.2 F (36.8 C)  TempSrc:      SpO2: 95% 96% 94% (!) 89%  Weight:      Height:        Isolation Precautions No active isolations  Medications Medications  enoxaparin (LOVENOX) injection 65 mg (65 mg Subcutaneous Given 07/06/19 1059)  acetaminophen (TYLENOL) tablet 650 mg (has no administration in time range)    Or  acetaminophen (TYLENOL) suppository 650 mg (has no administration in time range)  sodium chloride flush (NS) 0.9 % injection 3 mL (has no administration in time range)  0.9 %  sodium chloride infusion (has no administration in time range)  insulin aspart (novoLOG) injection 0-9 Units (7 Units Subcutaneous Given 07/06/19 1230)  insulin aspart (novoLOG) injection 0-5 Units (5 Units Subcutaneous Given 07/06/19 0256)  methylPREDNISolone sodium succinate (SOLU-MEDROL) 125 mg/2 mL injection 80 mg (80 mg Intravenous Given 07/06/19 1446)  albuterol (PROVENTIL) (2.5 MG/3ML) 0.083% nebulizer solution 3 mL (3 mLs Inhalation Given 07/06/19 1125)  azithromycin (ZITHROMAX) 500 mg in sodium chloride 0.9 % 250 mL IVPB (0 mg Intravenous Stopped 07/06/19 0930)  albuterol (VENTOLIN HFA) 108 (90 Base) MCG/ACT inhaler 6 puff (6 puffs Inhalation Given 07/05/19 2246)  methylPREDNISolone sodium succinate (SOLU-MEDROL) 125 mg/2 mL injection 60 mg (60 mg Intravenous Given 07/05/19 2247)  furosemide (LASIX) injection 60 mg (60 mg Intravenous Given 07/06/19 0202)   LORazepam (ATIVAN) injection 1 mg (1 mg Intravenous Given 07/06/19 0154)  ziprasidone (GEODON) injection 10 mg (10 mg Intramuscular Given 07/06/19 0348)  LORazepam (ATIVAN) injection 1 mg (1 mg Intravenous Given 07/06/19 0348)  sterile water (preservative free) injection (1 mL  Given 07/06/19 0349)  acetaminophen (TYLENOL) suppository 650 mg (650 mg Rectal Given 07/06/19 0743)    Mobility walks with device

## 2019-07-06 NOTE — Progress Notes (Signed)
PROGRESS NOTE    [name]  [id number] DOB: 03/09/1941 DOA: 07/05/2019 PCP: [name], MD   Brief Narrative:  [name]  is a 78 y.o. male,  history ofCOPD/chronic respiratory failure on4-5Lat home (patient is also supposed to be on BiPAP at night at home, but patient has not been very compliant), IDDM-2, CAD/CABG, AAA, diastolic CHF, HTN, AVM of GI tract, bipolar disorder/depression/anxietyandmorbid obesity. Patient was recently admitted with acute on chronic respiratory failure/COPD with exacerbation and discharged on 06/25/2019.  Pt apparently felt more sob earlier yesterday and presented to ED for evaluation. Pt denies fever, chills, cp, palp, n/v, abd pain, diarrhea, brbpr, dysuria, hematuria.   In ED,  T 98.3, P 88 R 22 Bp 148/87  Pox 99% on [place] WT 131.1kg CXR IMPRESSION: Underpenetrated frontal view. Unchanged small bilateral pleural effusions, left greater than right. No new or focal airspace opacity. Cardiomegaly. Wbc 10.1, Hgb 10.1, Plt 185 Na 137 ,K 5.3, Bun 11, Creatinne 1.07 Lactic acid 0.9 Trop 13 BNP 455.1 Blood culture x2 pending PH 7.239, Pco2 93.9, pO2 96.4 Pt received solumedrol 60mg  iv x1 albuterol 6 puffs, and ativan 1mg  iv x1 in the ED  Pt will be admitted for acute respiratory failure with hypoxia, and hypercapnea.  Secondary to Copd exacerbation.    Assessment & Plan:   Principal Problem:   Acute respiratory failure with hypoxia and hypercapnia (HCC) Active Problems:   Hyperlipidemia   Essential hypertension   Chronic diastolic CHF (congestive heart failure) (HCC)   Bipolar 1 disorder, mixed, moderate (HCC)   Acute respiratory failure with hypoxia and hypercapnea Secondary to Copd exacerbation C/w Solumedrol 80mg  iv q8h Zithromax 500mg  iv qday spiriva -> incruse 1puff qday DC Duoneb, (atrovent and spiriva together actually decrease lung function) Cont Dulera 2puff bid Albuterol HFA 2puff qid and q6h prn    Chronic diastolic chf Cont metoprolol 25mg  1/2 po bid  Cont Lisinopril 2.5mg  po qday Cont Lasix 60mg  po bid  Dm2 with neuropathy Cont fsbs ac and qhs, ISS Cont Lantus 35 units [place] qday Cont Gabapentin  Hyperlipidemia Cont Lipitor 10mg  po qhs  Anxiety  Cont Celexa 20mg  po qday Cont Xanax prn   Gerd Cont PPI  DVT prophylaxis: Lovenox SQ  Code Status: Full    Code Status Orders  (From admission, onward)         Start     Ordered   07/06/19 0133  Full code  Continuous     07/06/19 0135        Code Status History    Date Active Date Inactive Code Status Order ID Comments User Context   06/21/2019 0936 06/25/2019 1730 Full Code [id number]  Mercy Riding, MD ED   05/05/2019 1137 05/06/2019 2332 Full Code [id number]  Caren Griffins, MD Inpatient   04/27/2019 2033 04/28/2019 1819 Full Code [id number]  Lenore Cordia, MD ED   04/07/2019 1229 04/13/2019 1645 DNR [id number]  Wilhelmina Mcardle, MD Inpatient   04/06/2019 1048 04/07/2019 1229 Full Code [id number]  Lang Snow, NP ED   02/28/2019 0924 03/05/2019 2017 Full Code [id number]  Deatra James, MD ED   12/26/2018 1059 12/31/2018 2100 Full Code [id number]  Nita Sells, MD Inpatient   10/11/2018 2353 10/14/2018 1912 Full Code [id number]  Etta Quill, DO ED   10/02/2018 0309 10/06/2018 1741 Full Code [id number]  Vianne Bulls, MD ED   02/08/2018 1042 02/11/2018 1624 Full Code [id number]  Velvet Bathe, MD Inpatient   01/28/2018  [id number] 01/30/2018 2132 Full Code [id number]  Arta Silence, MD Inpatient   01/18/2018 2225 01/20/2018 2210 Full Code [id number]  Lance Coon, MD Inpatient   06/28/2017 1203 06/30/2017 1554 Full Code [id number]  Rondel Jumbo, PA-C ED   05/08/2017 1341 05/11/2017 2127 Full Code [id number]  Waldemar Dickens, MD ED   04/03/2017 1715 04/10/2017 1745 Full Code [id number]  Waldemar Dickens, MD ED   03/30/2017 1927 03/31/2017 1928 Full Code [id number]  Bonnielee Haff, MD Inpatient   02/22/2017 1614 02/24/2017  1829 Full Code [id number]  Fritzi Mandes, MD Inpatient   11/07/2016 1020 11/09/2016 1825 Full Code [id number]  Saundra Shelling, MD Inpatient   10/21/2016 0414 10/25/2016 1503 Full Code [id number]  Harvie Bridge, DO Inpatient   10/09/2016 1152 10/12/2016 1649 Full Code [id number]  Lytle Butte, MD ED   10/09/2016 1105 10/09/2016 1152 DNR [id number]  Lytle Butte, MD ED   07/24/2016 0806 07/27/2016 1759 DNR [id number]  Harrie Foreman, MD Inpatient   02/10/2016 1528 02/12/2016 2009 DNR [id number]  Loletha Grayer, MD ED   10/26/2015 0314 11/01/2015 2005 Full Code [id number]  Saundra Shelling, MD Inpatient   08/20/2015 0441 08/22/2015 1649 Full Code [id number]  Hower, Aaron Mose, MD ED   08/20/2015 0254 08/20/2015 0441 Full Code [id number]  Hower, Aaron Mose, MD ED   07/27/2015 2040 07/29/2015 1829 Full Code [id number]  Hower, Aaron Mose, MD ED   06/30/2015 1240 07/03/2015 1526 Full Code [id number]  Bettey Costa, MD Inpatient   05/19/2015 1459 05/23/2015 1621 Full Code [id number]  Max Sane, MD Inpatient   05/06/2015 1714 05/13/2015 1706 Full Code [id number]  Epifanio Lesches, MD ED   05/03/2015 1229 05/04/2015 1920 Full Code [id number]  Idelle Crouch, MD Inpatient   04/22/2015 0450 04/27/2015 1935 Full Code [id number]  Harrie Foreman, MD Inpatient   04/17/2015 1916 04/19/2015 1716 Full Code [id number]  Clovis Fredrickson, MD Inpatient   04/10/2015 0912 04/17/2015 1908 Full Code [id number]  Demetrios Loll, MD Inpatient   03/09/2015 1218 03/12/2015 1845 Full Code [id number]  Henreitta Leber, MD Inpatient   08/24/2014 2141 08/25/2014 1720 Full Code [id number]  Ivor Costa, MD ED   04/02/2014 2011 04/04/2014 1710 Full Code [id number]  Velvet Bathe, MD Inpatient   03/18/2014 1544 03/22/2014 1406 Full Code [id number]  Verlee Monte, MD Inpatient   12/04/2013 0613 12/10/2013 1954 Full Code [id number]  Rise Patience, MD Inpatient   10/15/2013 2011 10/18/2013 1840 Full Code [id number]  Fuller Plan, MD Inpatient   09/08/2013 0043 09/10/2013 1530 Full  Code [id number]  Toy Baker, MD Inpatient   08/19/2013 1748 08/22/2013 1551 Full Code [id number]  Orson Eva, MD ED   05/15/2013 0126 05/17/2013 1659 Full Code [id number]  Theodis Blaze, MD Inpatient   12/04/2012 1723 12/08/2012 1656 Full Code [id number]  Janece Canterbury, MD Inpatient   05/17/2012 1256 05/21/2012 1809 Full Code [id number]  Campbell Lerner, RN ED   04/06/2012 1935 04/14/2012 1723 Full Code [id number]  Marylou Mccoy, RN Inpatient   03/27/2012 0444 03/28/2012 1756 Full Code [id number]  Theotis Barrio, RN Inpatient   Advance Care Planning Activity     Family Communication: none today  Disposition Plan:   Patient did not inpatient secondary to COPD exacerbation with acute hypoxic and hypercarbic respiratory failure requiring BiPAP protocol,  Consults called: None Admission status: Inpatient   Consultants:   None  Procedures:  Dg Chest 1 View  Result Date: 06/24/2019 CLINICAL DATA:  Cough. EXAM: CHEST  1 VIEW COMPARISON:  06/21/2019 and CT from 03/30/2019 FINDINGS: Cardiac silhouette remains prominent for size but unchanged. Evidence for median sternotomy and prior CABG procedure. Chronic densities in the retrocardiac space related to large hiatal hernia and limited evaluation of this area on this single AP view. Visualized lungs are similar to the previous examination with central vascular prominence. No frank pulmonary edema. IMPRESSION: Chronic densities at the left lung base that are poorly characterized on this examination. Some of these densities are related to the hiatal hernia. Slightly prominent central vascular structures are unchanged. No frank pulmonary edema. Electronically Signed   By: Markus Daft M.D.   On: 06/24/2019 12:19   Dg Chest 2 View  Result Date: 07/05/2019 CLINICAL DATA:  Shortness of breath EXAM: CHEST - 2 VIEW COMPARISON:  06/24/2019 FINDINGS: Cardiomegaly status post median sternotomy. Underpenetrated frontal view. Unchanged small bilateral pleural effusions,  left greater than right. No new or focal airspace opacity. The visualized skeletal structures are unremarkable. IMPRESSION: Underpenetrated frontal view. Unchanged small bilateral pleural effusions, left greater than right. No new or focal airspace opacity. Cardiomegaly. Electronically Signed   By: Eddie Candle M.D.   On: 07/05/2019 21:26   Dg Chest Port 1 View  Result Date: 06/21/2019 CLINICAL DATA:  Shortness of breath EXAM: PORTABLE CHEST 1 VIEW COMPARISON:  Radiograph 05/05/2019, CT 03/30/2019 FINDINGS: Basilar predominant interstitial opacities with septal lines, central cuffing and venous congestion. Increased attenuation in the retrocardiac space is likely accentuated by a large hiatal hernia seen on prior studies. Cannot exclude underlying consolidation. Postsurgical changes related to prior CABG including intact and aligned sternotomy wires and multiple surgical clips projecting over the mediastinum. No acute osseous or soft tissue abnormality. IMPRESSION: 1. Basilar predominant interstitial opacities, central cuffing and venous congestion, concerning for pulmonary edema. 2. Increased attenuation in the retrocardiac space is likely accentuated by a large hiatal hernia seen on prior studies. Cannot exclude underlying consolidation. Electronically Signed   By: Lovena Le M.D.   On: 06/21/2019 05:03     Antimicrobials:   zithromax >9/18    Subjective: Patient seen in the ER on BiPAP Does awaken answer questions  Objective: Vitals:   07/06/19 1400 07/06/19 1401 07/06/19 1500 07/06/19 1530  BP:   (!) 143/70 (!) 130/55  Pulse: 96 98 90 85  Resp: (!) 21 17 18 18   Temp: 98.6 F (37 C) 98.8 F (37.1 C) 98.6 F (37 C) 98.2 F (36.8 C)  TempSrc:      SpO2: 95% 96% 94% (!) 89%  Weight:      Height:        Intake/Output Summary (Last 24 hours) at 07/06/2019 1613 Last data filed at 07/06/2019 1500 Gross per 24 hour  Intake -  Output 300 ml  Net -300 ml   Filed Weights   07/05/19  1959  Weight: 131.1 kg    Examination:  General exam: Appears calm and comfortable  Respiratory system: rhonchi and wheezing bilat Cardiovascular system: S1 & S2 heard, RRR. No JVD, murmurs, rubs, gallops or clicks. No pedal edema. Gastrointestinal system: Abdomen is nondistended, soft and nontender. No organomegaly or masses felt. Normal bowel sounds heard. Central nervous system: Alert and oriented. No focal neurological deficits. Extremities: wwp, trace pitting edema Skin: No rashes, lesions or ulcers Psychiatry: Judgement and insight appear normal. Mood & affect appropriate.     Data Reviewed: I have personally reviewed following labs and imaging studies  CBC: Recent Labs  Lab 07/05/19 1953 07/06/19 0830  WBC 10.1 8.4  HGB 10.1* 9.5*  HCT 35.5* 32.9*  MCV 93.7 91.1  PLT 185 [id number]   Basic Metabolic Panel: Recent Labs  Lab 07/05/19 1953 07/05/19 2242 07/06/19 0830  NA 137  --  136  K 5.3* 4.9 5.0  CL 91*  --  85*  CO2 37*  --  37*  GLUCOSE 171*  --  352*  BUN 11  --  16  CREATININE 1.07  --  1.11  CALCIUM 8.8*  --  9.2   GFR: Estimated Creatinine Clearance: 81.3 mL/min (by C-G formula based on SCr of 1.11 mg/dL). Liver Function Tests: Recent Labs  Lab 07/06/19 0830  AST 11*  ALT 17  ALKPHOS 120  BILITOT 0.4  PROT 7.2  ALBUMIN 3.3*   No results for input(s): LIPASE, AMYLASE in the last 168 hours. No results for input(s): AMMONIA in the last 168 hours. Coagulation Profile: No results for input(s): INR, PROTIME in the last 168 hours. Cardiac Enzymes: No results for input(s): CKTOTAL, CKMB, CKMBINDEX, TROPONINI in the last 168 hours. BNP (last 3 results) No results for input(s): PROBNP in the last 8760 hours. HbA1C: No results for input(s): HGBA1C in the last 72 hours. CBG: Recent Labs  Lab 07/06/19 0253 07/06/19 0843 07/06/19 1222  GLUCAP 280* 350* 318*   Lipid Profile: No results for input(s): CHOL, HDL, LDLCALC, TRIG, CHOLHDL, LDLDIRECT in  the last 72 hours. Thyroid Function Tests: No results for input(s): TSH, T4TOTAL, FREET4, T3FREE, THYROIDAB in the last 72 hours. Anemia Panel: No results for input(s): VITAMINB12, FOLATE, FERRITIN, TIBC, IRON, RETICCTPCT in the last 72 hours. Sepsis Labs: Recent Labs  Lab 07/05/19 2221  LATICACIDVEN 0.9    Recent Results (from the past 240 hour(s))  SARS Coronavirus 2 [facility] order, Performed in [facility] hospital lab)     Status: None   Collection Time: 07/05/19  7:43 PM  Result Value Ref Range Status   SARS Coronavirus 2 NEGATIVE NEGATIVE Final    Comment: (NOTE) If result is NEGATIVE SARS-CoV-2 target nucleic acids are NOT DETECTED. The SARS-CoV-2 RNA is generally detectable in upper and lower  respiratory specimens during the acute phase of infection. The lowest  concentration of SARS-CoV-2 viral copies this assay can detect is 250  copies / mL. A negative result does not preclude SARS-CoV-2 infection  and should not be used as the sole basis for treatment or other  patient management decisions.  A negative result may occur with  improper specimen collection / handling, submission of specimen other  than nasopharyngeal swab, presence of viral mutation(s) within the  areas targeted by this assay, and inadequate number of viral copies  (<250 copies / mL). A negative result must be combined with clinical  observations, patient history, and epidemiological information. If result is POSITIVE SARS-CoV-2 target nucleic acids are DETECTED. The SARS-CoV-2 RNA is generally detectable in upper and lower  respiratory specimens dur ing the acute phase of infection.  Positive  results are indicative of active infection with SARS-CoV-2.  Clinical  correlation with patient history and other diagnostic information is  necessary to determine patient infection status.  Positive results do  not rule out bacterial infection or co-infection with other viruses. If result is PRESUMPTIVE  POSTIVE SARS-CoV-2 nucleic acids MAY BE PRESENT.   A presumptive positive result was obtained on the submitted specimen  and confirmed on repeat testing.  While 2019 novel coronavirus  (SARS-CoV-2) nucleic acids may be present in the submitted sample  additional  confirmatory testing may be necessary for epidemiological  and / or clinical management purposes  to differentiate between  SARS-CoV-2 and other Sarbecovirus currently known to infect humans.  If clinically indicated additional testing with an alternate test  methodology [phone]) is advised. The SARS-CoV-2 RNA is generally  detectable in upper and lower respiratory sp ecimens during the acute  phase of infection. The expected result is Negative. Fact Sheet for Patients:  StrictlyIdeas.no Fact Sheet for Healthcare Providers: BankingDealers.co.za This test is not yet approved or cleared by the [place] FDA and has been authorized for detection and/or diagnosis of SARS-CoV-2 by FDA under an Emergency Use Authorization (EUA).  This EUA will remain in effect (meaning this test can be used) for the duration of the COVID-19 declaration under Section 564(b)(1) of the Act, 21 U.S.C. section 360bbb-3(b)(1), unless the authorization is terminated or revoked sooner. Performed at [facility], [place] [address] [place], [place] [address]          Radiology Studies: Dg Chest 2 View  Result Date: 07/05/2019 CLINICAL DATA:  Shortness of breath EXAM: CHEST - 2 VIEW COMPARISON:  06/24/2019 FINDINGS: Cardiomegaly status post median sternotomy. Underpenetrated frontal view. Unchanged small bilateral pleural effusions, left greater than right. No new or focal airspace opacity. The visualized skeletal structures are unremarkable. IMPRESSION: Underpenetrated frontal view. Unchanged small bilateral pleural effusions, left greater than right. No new or focal airspace opacity.  Cardiomegaly. Electronically Signed   By: Eddie Candle M.D.   On: 07/05/2019 21:26        Scheduled Meds: . albuterol  3 mL Inhalation QID  . enoxaparin (LOVENOX) injection  65 mg Subcutaneous Q24H  . insulin aspart  0-5 Units Subcutaneous QHS  . insulin aspart  0-9 Units Subcutaneous TID WC  . methylPREDNISolone (SOLU-MEDROL) injection  80 mg Intravenous Q8H   Continuous Infusions: . sodium chloride    . azithromycin Stopped (07/06/19 0930)     LOS: 0 days    Time spent: 13 min    Nicolette Bang, MD [facility]  If 7PM-7AM, please contact night-coverage  07/06/2019, 4:13 PM

## 2019-07-06 NOTE — ED Notes (Signed)
Safety mitten placed on pt due to pt continuously grabbing at [place].

## 2019-07-07 LAB — CBC WITH DIFFERENTIAL/PLATELET
Abs Immature Granulocytes: 0.12 10*3/uL — ABNORMAL HIGH (ref 0.00–0.07)
Basophils Absolute: 0 10*3/uL (ref 0.0–0.1)
Basophils Relative: 0 %
Eosinophils Absolute: 0 10*3/uL (ref 0.0–0.5)
Eosinophils Relative: 0 %
HCT: 31.7 % — ABNORMAL LOW (ref 39.0–52.0)
Hemoglobin: 9.5 g/dL — ABNORMAL LOW (ref 13.0–17.0)
Immature Granulocytes: 1 %
Lymphocytes Relative: 5 %
Lymphs Abs: 0.5 10*3/uL — ABNORMAL LOW (ref 0.7–4.0)
MCH: 26.5 pg (ref 26.0–34.0)
MCHC: 30 g/dL (ref 30.0–36.0)
MCV: 88.5 fL (ref 80.0–100.0)
Monocytes Absolute: 0.3 10*3/uL (ref 0.1–1.0)
Monocytes Relative: 2 %
Neutro Abs: 9.9 10*3/uL — ABNORMAL HIGH (ref 1.7–7.7)
Neutrophils Relative %: 92 %
Platelets: 175 10*3/uL (ref 150–400)
RBC: 3.58 MIL/uL — ABNORMAL LOW (ref 4.22–5.81)
RDW: 14.3 % (ref 11.5–15.5)
WBC: 10.7 10*3/uL — ABNORMAL HIGH (ref 4.0–10.5)
nRBC: 0 % (ref 0.0–0.2)

## 2019-07-07 LAB — BASIC METABOLIC PANEL
Anion gap: 14 (ref 5–15)
BUN: 26 mg/dL — ABNORMAL HIGH (ref 8–23)
CO2: 32 mmol/L (ref 22–32)
Calcium: 9.3 mg/dL (ref 8.9–10.3)
Chloride: 88 mmol/L — ABNORMAL LOW (ref 98–111)
Creatinine, Ser: 1.27 mg/dL — ABNORMAL HIGH (ref 0.61–1.24)
GFR calc Af Amer: >60 mL/min (ref >60)
GFR calc non Af Amer: 54 mL/min — ABNORMAL LOW (ref >60)
Glucose, Bld: 310 mg/dL — ABNORMAL HIGH (ref 70–99)
Potassium: 4.8 mmol/L (ref 3.5–5.1)
Sodium: 134 mmol/L — ABNORMAL LOW (ref 135–145)

## 2019-07-07 LAB — GLUCOSE, CAPILLARY
Glucose-Capillary: 309 mg/dL — ABNORMAL HIGH (ref 70–99)
Glucose-Capillary: 300 mg/dL — ABNORMAL HIGH (ref 70–99)
Glucose-Capillary: 164 mg/dL — ABNORMAL HIGH (ref 70–99)

## 2019-07-07 MED ORDER — HYDRALAZINE HCL 20 MG/ML IJ SOLN
10.0000 mg | Freq: Four times a day (QID) | INTRAMUSCULAR | Status: DC | PRN
  Filled 2019-07-07: qty 1

## 2019-07-07 MED ORDER — ENOXAPARIN SODIUM 60 MG/0.6ML ~~LOC~~ SOLN
60.0000 mg | SUBCUTANEOUS | Status: DC
  Administered 2019-07-08: 60 mg via SUBCUTANEOUS
  Filled 2019-07-07 (×2): qty 0.6

## 2019-07-07 NOTE — Progress Notes (Signed)
PROGRESS NOTE    MCLAREN LOUCH  [id number] DOB: 07-03-1941 DOA: 07/05/2019 PCP: [name], MD   Brief Narrative:  [name] a78 y.o.male,history ofCOPD/chronic respiratory failure on4-5Lat home (patient is also supposed to be on BiPAP at night at home, but patient has not been very compliant), IDDM-2, CAD/CABG, AAA, diastolic CHF, HTN, AVM of GI tract, bipolar disorder/depression/anxietyandmorbid obesity. Patient wasrecentlyadmitted with acute on chronic respiratory failure/COPD with exacerbationand discharged on 06/25/2019. Pt apparently felt more sob earlier yesterday and presented to ED for evaluation. Pt denies fever, chills, cp, palp, n/v, abd pain, diarrhea, brbpr, dysuria, hematuria.   Assessment & Plan:   Principal Problem:   Acute respiratory failure with hypoxia and hypercapnia (HCC) Active Problems:   Hyperlipidemia   Essential hypertension   Chronic diastolic CHF (congestive heart failure) (HCC)   Bipolar 1 disorder, mixed, moderate (HCC)   Acute respiratory failure with hypoxia and hypercapnea Secondary to Copd exacerbation C/w Solumedrol but decrease to 40mg  iv q8h Zithromax 500mg  iv qday day 2 spiriva -> incruse 1puff qday DC Duoneb, (atrovent and spiriva together actually decrease lung function) Cont Dulera 2puff bid Albuterol HFA 2puff qid and q6h prn   Chronic diastolic chf Cont metoprolol 25mg  1/2 po bid  Cont Lisinopril 2.5mg  po qday Cont Lasix 60mg  po bid  Dm2 with neuropathy Cont fsbs ac and qhs, ISS Cont Lantus 35 units [name] qday Cont Gabapentin  Hyperlipidemia Cont Lipitor 10mg  po qhs  Anxiety  Cont Celexa 20mg  po qday Cont Xanax prn   Gerd Cont PPI  DVT prophylaxis: Lovenox SQ  Code Status: full    Code Status Orders  (From admission, onward)         Start     Ordered   07/06/19 0133  Full code  Continuous     07/06/19 0135        Code Status History    Date Active Date Inactive Code Status  Order ID Comments User Context   06/21/2019 0936 06/25/2019 1730 Full Code [id number]  Mercy Riding, MD ED   05/05/2019 1137 05/06/2019 2332 Full Code [id number]  Caren Griffins, MD Inpatient   04/27/2019 2033 04/28/2019 1819 Full Code [id number]  Lenore Cordia, MD ED   04/07/2019 1229 04/13/2019 1645 DNR [id number]  Wilhelmina Mcardle, MD Inpatient   04/06/2019 1048 04/07/2019 1229 Full Code [id number]  Lang Snow, NP ED   02/28/2019 0924 03/05/2019 2017 Full Code [id number]  Deatra James, MD ED   12/26/2018 1059 12/31/2018 2100 Full Code [id number]  Nita Sells, MD Inpatient   10/11/2018 2353 10/14/2018 1912 Full Code [id number]  Etta Quill, DO ED   10/02/2018 0309 10/06/2018 1741 Full Code [id number]  Vianne Bulls, MD ED   02/08/2018 1042 02/11/2018 1624 Full Code [id number]  Velvet Bathe, MD Inpatient   01/28/2018 0641 01/30/2018 2132 Full Code [id number]  Arta Silence, MD Inpatient   01/18/2018 2225 01/20/2018 2210 Full Code [id number]  Lance Coon, MD Inpatient   06/28/2017 1203 06/30/2017 1554 Full Code [id number]  Rondel Jumbo, PA-C ED   05/08/2017 1341 05/11/2017 2127 Full Code [id number]  Waldemar Dickens, MD ED   04/03/2017 1715 04/10/2017 1745 Full Code [id number]  Waldemar Dickens, MD ED   03/30/2017 1927 03/31/2017 1928 Full Code [id number]  Bonnielee Haff, MD Inpatient   02/22/2017 1614 02/24/2017 [place] Full Code [id number]  Fritzi Mandes, MD Inpatient   11/07/2016 1020 11/09/2016 1825 Full Code [id number]  Saundra Shelling, MD Inpatient  10/21/2016 0414 10/25/2016 1503 Full Code [id number]  Harvie Bridge, DO Inpatient   10/09/2016 1152 10/12/2016 1649 Full Code [id number]  Lytle Butte, MD ED   10/09/2016 1105 10/09/2016 1152 DNR [id number]  Lavetta Nielsen, Aaron Mose, MD ED   07/24/2016 0806 07/27/2016 1759 DNR [id number]  Harrie Foreman, MD Inpatient   02/10/2016 1528 02/12/2016 2009 DNR [id number]  Loletha Grayer, MD ED   10/26/2015 0314 11/01/2015 2005 Full Code [id number]  Saundra Shelling,  MD Inpatient   08/20/2015 0441 08/22/2015 1649 Full Code [id number]  Hower, Aaron Mose, MD ED   08/20/2015 0254 08/20/2015 0441 Full Code [id number]  Hower, Aaron Mose, MD ED   07/27/2015 2040 07/29/2015 1829 Full Code [id number]  Hower, Aaron Mose, MD ED   06/30/2015 1240 07/03/2015 1526 Full Code [id number]  Bettey Costa, MD Inpatient   05/19/2015 1459 05/23/2015 1621 Full Code [id number]  Max Sane, MD Inpatient   05/06/2015 1714 05/13/2015 1706 Full Code [id number]  Epifanio Lesches, MD ED   05/03/2015 1229 05/04/2015 1920 Full Code [id number]  Idelle Crouch, MD Inpatient   04/22/2015 0450 04/27/2015 1935 Full Code [id number]  Harrie Foreman, MD Inpatient   04/17/2015 1916 04/19/2015 1716 Full Code [id number]  Clovis Fredrickson, MD Inpatient   04/10/2015 0912 04/17/2015 1908 Full Code [id number]  Demetrios Loll, MD Inpatient   03/09/2015 1218 03/12/2015 1845 Full Code [id number]  Henreitta Leber, MD Inpatient   08/24/2014 2141 08/25/2014 1720 Full Code [id number]  Ivor Costa, MD ED   04/02/2014 2011 04/04/2014 1710 Full Code [id number]  Velvet Bathe, MD Inpatient   03/18/2014 1544 03/22/2014 1406 Full Code [id number]  Verlee Monte, MD Inpatient   12/04/2013 0613 12/10/2013 1954 Full Code [id number]  Rise Patience, MD Inpatient   10/15/2013 2011 10/18/2013 1840 Full Code [id number]  Fuller Plan, MD Inpatient   09/08/2013 0043 09/10/2013 1530 Full Code [id number]  Toy Baker, MD Inpatient   08/19/2013 1748 08/22/2013 1551 Full Code [id number]  Orson Eva, MD ED   05/15/2013 0126 05/17/2013 1659 Full Code [id number]  Theodis Blaze, MD Inpatient   12/04/2012 1723 12/08/2012 1656 Full Code [id number]  Janece Canterbury, MD Inpatient   05/17/2012 1256 05/21/2012 1809 Full Code [id number]  Campbell Lerner, RN ED   04/06/2012 1935 04/14/2012 1723 Full Code [id number]  Marylou Mccoy, RN Inpatient   03/27/2012 0444 03/28/2012 1756 Full Code [id number]  Theotis Barrio, RN Inpatient   Advance Care Planning Activity     Family Communication:  called wife ID:[id number], n/a Disposition Plan:   Patient will remain inpatient, continue with IV steroids, respiratory support, and IV antibiotics.  Patient not yet safe for discharge from hospital anticipate additional 1 to 2 days Consults called: None Admission status: Inpatient   Consultants:   None  Procedures:  Dg Chest 1 View  Result Date: 06/24/2019 CLINICAL DATA:  Cough. EXAM: CHEST  1 VIEW COMPARISON:  06/21/2019 and CT from 03/30/2019 FINDINGS: Cardiac silhouette remains prominent for size but unchanged. Evidence for median sternotomy and prior CABG procedure. Chronic densities in the retrocardiac space related to large hiatal hernia and limited evaluation of this area on this single AP view. Visualized lungs are similar to the previous examination with central vascular prominence. No frank pulmonary edema. IMPRESSION: Chronic densities at the left lung base that are poorly characterized on this examination. Some of these densities are related to the hiatal hernia. Slightly prominent central vascular structures are unchanged. No frank pulmonary edema. Electronically Signed  By: Markus Daft M.D.   On: 06/24/2019 12:19   Dg Chest 2 View  Result Date: 07/05/2019 CLINICAL DATA:  Shortness of breath EXAM: CHEST - 2 VIEW COMPARISON:  06/24/2019 FINDINGS: Cardiomegaly status post median sternotomy. Underpenetrated frontal view. Unchanged small bilateral pleural effusions, left greater than right. No new or focal airspace opacity. The visualized skeletal structures are unremarkable. IMPRESSION: Underpenetrated frontal view. Unchanged small bilateral pleural effusions, left greater than right. No new or focal airspace opacity. Cardiomegaly. Electronically Signed   By: Eddie Candle M.D.   On: 07/05/2019 21:26   Dg Chest Port 1 View  Result Date: 06/21/2019 CLINICAL DATA:  Shortness of breath EXAM: PORTABLE CHEST 1 VIEW COMPARISON:  Radiograph 05/05/2019, CT 03/30/2019 FINDINGS: Basilar predominant  interstitial opacities with septal lines, central cuffing and venous congestion. Increased attenuation in the retrocardiac space is likely accentuated by a large hiatal hernia seen on prior studies. Cannot exclude underlying consolidation. Postsurgical changes related to prior CABG including intact and aligned sternotomy wires and multiple surgical clips projecting over the mediastinum. No acute osseous or soft tissue abnormality. IMPRESSION: 1. Basilar predominant interstitial opacities, central cuffing and venous congestion, concerning for pulmonary edema. 2. Increased attenuation in the retrocardiac space is likely accentuated by a large hiatal hernia seen on prior studies. Cannot exclude underlying consolidation. Electronically Signed   By: Lovena Le M.D.   On: 06/21/2019 05:03     Antimicrobials:   zithromax day 2   Subjective: Pt is oppostional and disrespectful with staff Pt was belligerent with me this morning He remains angry and combative and unfortunately has a history of this behavior  Objective: Vitals:   07/07/19 0700 07/07/19 0740 07/07/19 0800 07/07/19 0900  BP: (!) 164/101  (!) 197/84   Pulse: (!) 113  (!) 106 (!) 104  Resp: (!) 24  19 14   Temp: 99.7 F (37.6 C)  99.7 F (37.6 C) 99.5 F (37.5 C)  TempSrc:      SpO2: 95% 94% 92%   Weight:      Height:        Intake/Output Summary (Last 24 hours) at 07/07/2019 1205 Last data filed at 07/07/2019 0600 Gross per 24 hour  Intake 100 ml  Output 1200 ml  Net -1100 ml   Filed Weights   07/05/19 1959  Weight: 131.1 kg    Examination:  General exam: Angry, belligerent Respiratory system: Limited exam because of patient's belligerence, coarse rhonchi noted. Cardiovascular system: S1 & S2 heard, RRR. No JVD, murmurs, rubs, gallops or clicks. No pedal edema. Gastrointestinal system: Abdomen is nondistended, soft and nontender. No organomegaly or masses felt. Normal bowel sounds heard. Central nervous system: Alert  and oriented. No focal neurological deficits. Extremities: Does move all 4 extremities freely, trace edema Skin: No rashes, lesions or ulcers Psychiatry: Judgement and insight are poor. Mood & affect labile    Data Reviewed: I have personally reviewed following labs and imaging studies  CBC: Recent Labs  Lab 07/05/19 1953 07/06/19 0830 07/07/19 0254  WBC 10.1 8.4 10.7*  NEUTROABS  --   --  9.9*  HGB 10.1* 9.5* 9.5*  HCT 35.5* 32.9* 31.7*  MCV 93.7 91.1 88.5  PLT 185 185 0000000   Basic Metabolic Panel: Recent Labs  Lab 07/05/19 1953 07/05/19 2242 07/06/19 0830 07/07/19 0254  NA 137  --  136 134*  K 5.3* 4.9 5.0 4.8  CL 91*  --  85* 88*  CO2 37*  --  37* 32  GLUCOSE 171*  --  352* 310*  BUN 11  --  16 26*  CREATININE 1.07  --  1.11 1.27*  CALCIUM 8.8*  --  9.2 9.3   GFR: Estimated Creatinine Clearance: 71 mL/min (A) (by C-G formula based on SCr of 1.27 mg/dL (H)). Liver Function Tests: Recent Labs  Lab 07/06/19 0830  AST 11*  ALT 17  ALKPHOS 120  BILITOT 0.4  PROT 7.2  ALBUMIN 3.3*   No results for input(s): LIPASE, AMYLASE in the last 168 hours. No results for input(s): AMMONIA in the last 168 hours. Coagulation Profile: No results for input(s): INR, PROTIME in the last 168 hours. Cardiac Enzymes: No results for input(s): CKTOTAL, CKMB, CKMBINDEX, TROPONINI in the last 168 hours. BNP (last 3 results) No results for input(s): PROBNP in the last 8760 hours. HbA1C: No results for input(s): HGBA1C in the last 72 hours. CBG: Recent Labs  Lab 07/06/19 0843 07/06/19 1222 07/06/19 1840 07/06/19 2102 07/07/19 0735  GLUCAP 350* 318* 304* 342* 309*   Lipid Profile: No results for input(s): CHOL, HDL, LDLCALC, TRIG, CHOLHDL, LDLDIRECT in the last 72 hours. Thyroid Function Tests: No results for input(s): TSH, T4TOTAL, FREET4, T3FREE, THYROIDAB in the last 72 hours. Anemia Panel: No results for input(s): VITAMINB12, FOLATE, FERRITIN, TIBC, IRON, RETICCTPCT  in the last 72 hours. Sepsis Labs: Recent Labs  Lab 07/05/19 2221  LATICACIDVEN 0.9    Recent Results (from the past 240 hour(s))  SARS Coronavirus 2 [name] order, Performed in [place] hospital lab)     Status: None   Collection Time: 07/05/19  7:43 PM  Result Value Ref Range Status   SARS Coronavirus 2 NEGATIVE NEGATIVE Final    Comment: (NOTE) If result is NEGATIVE SARS-CoV-2 target nucleic acids are NOT DETECTED. The SARS-CoV-2 RNA is generally detectable in upper and lower  respiratory specimens during the acute phase of infection. The lowest  concentration of SARS-CoV-2 viral copies this assay can detect is 250  copies / mL. A negative result does not preclude SARS-CoV-2 infection  and should not be used as the sole basis for treatment or other  patient management decisions.  A negative result may occur with  improper specimen collection / handling, submission of specimen other  than nasopharyngeal swab, presence of viral mutation(s) within the  areas targeted by this assay, and inadequate number of viral copies  (<250 copies / mL). A negative result must be combined with clinical  observations, patient history, and epidemiological information. If result is POSITIVE SARS-CoV-2 target nucleic acids are DETECTED. The SARS-CoV-2 RNA is generally detectable in upper and lower  respiratory specimens dur ing the acute phase of infection.  Positive  results are indicative of active infection with SARS-CoV-2.  Clinical  correlation with patient history and other diagnostic information is  necessary to determine patient infection status.  Positive results do  not rule out bacterial infection or co-infection with other viruses. If result is PRESUMPTIVE POSTIVE SARS-CoV-2 nucleic acids MAY BE PRESENT.   A presumptive positive result was obtained on the submitted specimen  and confirmed on repeat testing.  While 2019 novel coronavirus  (SARS-CoV-2) nucleic acids may be present  in the submitted sample  additional confirmatory testing may be necessary for epidemiological  and / or clinical management purposes  to differentiate between  SARS-CoV-2 and other Sarbecovirus currently known to infect humans.  If clinically indicated additional testing with an alternate test  methodology [phone]) is advised. The SARS-CoV-2 RNA is generally  detectable in upper and lower respiratory sp ecimens during the acute  phase of infection. The expected result is Negative. Fact Sheet for Patients:  StrictlyIdeas.no Fact Sheet for Healthcare Providers: BankingDealers.co.za This test is not yet approved or cleared by the [place] FDA and has been authorized for detection and/or diagnosis of SARS-CoV-2 by FDA under an Emergency Use Authorization (EUA).  This EUA will remain in effect (meaning this test can be used) for the duration of the COVID-19 declaration under Section 564(b)(1) of the Act, 21 U.S.C. section 360bbb-3(b)(1), unless the authorization is terminated or revoked sooner. Performed at [facility], [place] [address] [place], [place] [address]          Radiology Studies: Dg Chest 2 View  Result Date: 07/05/2019 CLINICAL DATA:  Shortness of breath EXAM: CHEST - 2 VIEW COMPARISON:  06/24/2019 FINDINGS: Cardiomegaly status post median sternotomy. Underpenetrated frontal view. Unchanged small bilateral pleural effusions, left greater than right. No new or focal airspace opacity. The visualized skeletal structures are unremarkable. IMPRESSION: Underpenetrated frontal view. Unchanged small bilateral pleural effusions, left greater than right. No new or focal airspace opacity. Cardiomegaly. Electronically Signed   By: Eddie Candle M.D.   On: 07/05/2019 21:26        Scheduled Meds: . albuterol  3 mL Inhalation QID  . chlorhexidine  15 mL Mouth Rinse BID  . Chlorhexidine Gluconate Cloth  6 each  Topical Daily  . citalopram  40 mg Oral Daily  . [START ON 07/08/2019] enoxaparin (LOVENOX) injection  60 mg Subcutaneous Q24H  . furosemide  60 mg Oral Daily  . gabapentin  400 mg Oral TID  . insulin aspart  0-5 Units Subcutaneous QHS  . insulin aspart  0-9 Units Subcutaneous TID WC  . insulin aspart  25 Units Subcutaneous TID WC  . insulin glargine  35 Units Subcutaneous BID  . lisinopril  2.5 mg Oral Daily  . mouth rinse  15 mL Mouth Rinse q12n4p  . Melatonin  5 mg Oral QHS  . methylPREDNISolone (SOLU-MEDROL) injection  80 mg Intravenous Q8H  . metoprolol tartrate  12.5 mg Oral BID  . mometasone-formoterol  2 puff Inhalation BID  . pantoprazole  40 mg Oral BID  . sodium chloride flush  3 mL Intravenous Q12H  . umeclidinium bromide  1 puff Inhalation Daily   Continuous Infusions: . sodium chloride    . azithromycin 500 mg (07/07/19 0826)     LOS: 1 day    Time spent: 32 min    Nicolette Bang, MD [facility]  If 7PM-7AM, please contact night-coverage  07/07/2019, 12:05 PM

## 2019-07-08 ENCOUNTER — Other Ambulatory Visit: Payer: Self-pay

## 2019-07-08 LAB — CBC WITH DIFFERENTIAL/PLATELET
Abs Immature Granulocytes: 0.13 10*3/uL — ABNORMAL HIGH (ref 0.00–0.07)
Basophils Absolute: 0 10*3/uL (ref 0.0–0.1)
Basophils Relative: 0 %
Eosinophils Absolute: 0 10*3/uL (ref 0.0–0.5)
Eosinophils Relative: 0 %
HCT: 30.3 % — ABNORMAL LOW (ref 39.0–52.0)
Hemoglobin: 9.1 g/dL — ABNORMAL LOW (ref 13.0–17.0)
Immature Granulocytes: 1 %
Lymphocytes Relative: 5 %
Lymphs Abs: 0.5 10*3/uL — ABNORMAL LOW (ref 0.7–4.0)
MCH: 26.4 pg (ref 26.0–34.0)
MCHC: 30 g/dL (ref 30.0–36.0)
MCV: 87.8 fL (ref 80.0–100.0)
Monocytes Absolute: 0.3 10*3/uL (ref 0.1–1.0)
Monocytes Relative: 3 %
Neutro Abs: 9.1 10*3/uL — ABNORMAL HIGH (ref 1.7–7.7)
Neutrophils Relative %: 91 %
Platelets: 176 10*3/uL (ref 150–400)
RBC: 3.45 MIL/uL — ABNORMAL LOW (ref 4.22–5.81)
RDW: 14.6 % (ref 11.5–15.5)
WBC: 10 10*3/uL (ref 4.0–10.5)
nRBC: 0 % (ref 0.0–0.2)

## 2019-07-08 LAB — BASIC METABOLIC PANEL
Anion gap: 14 (ref 5–15)
BUN: 41 mg/dL — ABNORMAL HIGH (ref 8–23)
CO2: 32 mmol/L (ref 22–32)
Calcium: 8.9 mg/dL (ref 8.9–10.3)
Chloride: 85 mmol/L — ABNORMAL LOW (ref 98–111)
Creatinine, Ser: 1.49 mg/dL — ABNORMAL HIGH (ref 0.61–1.24)
GFR calc Af Amer: 52 mL/min — ABNORMAL LOW (ref >60)
GFR calc non Af Amer: 45 mL/min — ABNORMAL LOW (ref >60)
Glucose, Bld: 234 mg/dL — ABNORMAL HIGH (ref 70–99)
Potassium: 3.9 mmol/L (ref 3.5–5.1)
Sodium: 131 mmol/L — ABNORMAL LOW (ref 135–145)

## 2019-07-08 LAB — GLUCOSE, CAPILLARY
Glucose-Capillary: 210 mg/dL — ABNORMAL HIGH (ref 70–99)
Glucose-Capillary: 261 mg/dL — ABNORMAL HIGH (ref 70–99)
Glucose-Capillary: 321 mg/dL — ABNORMAL HIGH (ref 70–99)

## 2019-07-08 MED ORDER — PREDNISONE 5 MG (21) PO TBPK: ORAL_TABLET | ORAL | 0 refills | Status: DC

## 2019-07-08 MED ORDER — DOXYCYCLINE MONOHYDRATE 100 MG PO CAPS: 100.0000 mg | ORAL_CAPSULE | Freq: Two times a day (BID) | ORAL | 0 refills | Status: AC

## 2019-07-08 NOTE — Progress Notes (Signed)
Pt received from ICU to room 1418 via wheelchair with  portable O2 @4L /.   Pt assisted to bed, telemetry placed and CMT called to start monitoring.  VS stable.  Pt denies any pain or needs at present.  Will continue to monitor.

## 2019-07-08 NOTE — Discharge Summary (Signed)
Physician Discharge Summary  [name] [id number] DOB: [birth year] DOA: 07/05/2019  PCP: [name], MD  Admit date: 07/05/2019 Discharge date: 07/08/2019  Admitted From: Inpatient Disposition: home  Recommendations for Outpatient Follow-up:  1. Follow up with PCP in 1-2 weeks   Home Health:No Equipment/Devices:none  Discharge Condition:Stable CODE STATUS:Full code Diet recommendation: Diabetic diet  Brief/Interim Summary: HarryGassawayis a78 y.o.male,history ofCOPD/chronic respiratory failure on4-5Lat home (patient is also supposed to be on BiPAP at night at home, but patient has not been very compliant), IDDM-2, CAD/CABG, AAA, diastolic CHF, HTN, AVM of GI tract, bipolar disorder/depression/anxietyandmorbid obesity. Patient wasrecentlyadmitted with acute on chronic respiratory failure/COPD with exacerbationand discharged on 06/25/2019. Pt apparently felt more sob earlier yesterday and presented to ED for evaluation. Pt denies fever, chills, cp, palp, n/v, abd pain, diarrhea, brbpr, dysuria, hematuria.  Hospital course.  Patient was admitted for acute respiratory failure with hypoxia and hypercarbia in the setting of COPD exacerbation on chronic O2 with 4 to 5 L at home.  He was initially placed on CPAP which patient is nonadherent to, and he was started on steroids which were decreased yesterday and patient tolerated well was also started on antibiotics.  Patient is at his baseline O2 need his lungs are clear to be discharged home in his home medication without acute change.  While in the hospital his home medications are continued for his chronic treatment of diastolic congestive heart failure, type 2 diabetes with neuropathy, hyperlipidemia, anxiety and GERD.  Continue his home medications no changes he will follow-up as PCP for further titration management of lipids and A1c.   Of note during hospital stay patient has been verbally abusive towards staff  unfortunately he has a history of doing this.  In future admissions I would recommend male staffing as he is more abusive towards male staff.  Discharge Diagnoses:  Principal Problem:   Acute respiratory failure with hypoxia and hypercapnia (HCC) Active Problems:   Hyperlipidemia   Essential hypertension   Chronic diastolic CHF (congestive heart failure) (HCC)   Bipolar 1 disorder, mixed, moderate (HCC)    Discharge Instructions  Discharge Instructions    Call MD for:   Complete by: As directed    For any acute change in medical condition   Diet Carb Modified   Complete by: As directed    Increase activity slowly   Complete by: As directed      Allergies as of 07/08/2019      Reactions   Morphine And Related Shortness Of Breath, Nausea And Vomiting, Rash, Other (See Comments)   Reaction:  Hallucinations    Penicillins Anaphylaxis, Hives, Other (See Comments)   10/02/18 - discussed with patient and he states he can tolerate penicillin capsules and had some hives when he was 78yo and received pcn injections Has patient had a PCN reaction causing immediate rash, facial/tongue/throat swelling, SOB or lightheadedness with hypotension: Yes Has patient had a PCN reaction causing severe rash involving mucus membranes or skin necrosis: No Has patient had a PCN reaction that required hospitalization No Has patient had a PCN reaction occurring within the last 10 y   Zolpidem Shortness Of Breath   Demerol [meperidine] Other (See Comments)   Reaction:  Hallucinations     Dilaudid [hydromorphone Hcl] Other (See Comments)   Reaction:  Hallucinations    Levofloxacin Other (See Comments)   Reaction:  Unknown       Medication List    TAKE these medications   acetaminophen 500 MG tablet  Commonly known as: TYLENOL Take 1,000 mg by mouth every 6 (six) hours as needed for fever or headache (pain).   albuterol 108 (90 Base) MCG/ACT inhaler Commonly known as: VENTOLIN HFA Inhale 2 puffs  into the lungs every 6 (six) hours as needed for wheezing or shortness of breath.   ALPRAZolam 0.25 MG tablet Commonly known as: XANAX Take 1 tablet (0.25 mg total) by mouth 3 (three) times daily as needed for anxiety or sleep.   atorvastatin 10 MG tablet Commonly known as: LIPITOR Take 1 tablet (10 mg total) by mouth daily at 6 PM.   benzonatate 100 MG capsule Commonly known as: TESSALON Take 100 mg by mouth every 8 (eight) hours as needed for cough or congestion.   citalopram 40 MG tablet Commonly known as: CELEXA Take 0.5 tablets (20 mg total) by mouth daily. What changed: how much to take   [employer] Melatonin 5 MG Tabs Generic drug: Melatonin Take 5 mg by mouth at bedtime. For sleep   docusate sodium 100 MG capsule Commonly known as: COLACE Take 1 capsule (100 mg total) by mouth 2 (two) times daily. What changed:   when to take this  reasons to take this   Dulera 200-5 MCG/ACT Aero Generic drug: mometasone-formoterol Inhale 2 puffs into the lungs 2 (two) times daily as needed for wheezing or shortness of breath.   fluticasone 50 MCG/ACT nasal spray Commonly known as: FLONASE Place 2 sprays into both nostrils daily. What changed:   when to take this  reasons to take this   furosemide 20 MG tablet Commonly known as: LASIX Take 3 tablets (60 mg total) by mouth daily. What changed:   how much to take  when to take this  reasons to take this   gabapentin 800 MG tablet Commonly known as: NEURONTIN Take 400 mg by mouth 4 (four) times daily.   gabapentin 400 MG capsule Commonly known as: Neurontin Take 1 capsule (400 mg total) by mouth 3 (three) times daily.   insulin aspart 100 UNIT/ML injection Commonly known as: novoLOG Inject 25 Units into the skin 3 (three) times daily with meals.   insulin glargine 100 UNIT/ML injection Commonly known as: LANTUS Inject 0.35 mLs (35 Units total) into the skin 2 (two) times daily.   ipratropium-albuterol 0.5-2.5 (3)  MG/3ML Soln Commonly known as: DUONEB He is used 3 times daily for next 5 days, then 3 times daily as needed What changed:   how much to take  how to take this  when to take this  reasons to take this   lisinopril 2.5 MG tablet Commonly known as: ZESTRIL Take 1 tablet (2.5 mg total) by mouth daily.   loperamide 2 MG tablet Commonly known as: IMODIUM A-D Take 2 mg by mouth 4 (four) times daily as needed for diarrhea or loose stools.   loratadine 10 MG tablet Commonly known as: CLARITIN Take 1 tablet (10 mg total) by mouth daily.   metoprolol tartrate 25 MG tablet Commonly known as: LOPRESSOR Take 0.5 tablets (12.5 mg total) by mouth 2 (two) times daily.   pantoprazole 40 MG tablet Commonly known as: PROTONIX Take 1 tablet (40 mg total) by mouth 2 (two) times daily.   propranolol 10 MG tablet Commonly known as: INDERAL Take 10 mg by mouth daily.   tiotropium 18 MCG inhalation capsule Commonly known as: SPIRIVA Place 18 mcg into inhaler and inhale daily.   tiZANidine 2 MG tablet Commonly known as: ZANAFLEX Take 2 mg by  mouth 2 (two) times daily as needed for muscle spasms.       Allergies  Allergen Reactions  . Morphine And Related Shortness Of Breath, Nausea And Vomiting, Rash and Other (See Comments)    Reaction:  Hallucinations   . Penicillins Anaphylaxis, Hives and Other (See Comments)    10/02/18 - discussed with patient and he states he can tolerate penicillin capsules and had some hives when he was 78yo and received pcn injections  Has patient had a PCN reaction causing immediate rash, facial/tongue/throat swelling, SOB or lightheadedness with hypotension: Yes Has patient had a PCN reaction causing severe rash involving mucus membranes or skin necrosis: No Has patient had a PCN reaction that required hospitalization No Has patient had a PCN reaction occurring within the last 10 y  . Zolpidem Shortness Of Breath  . Demerol [Meperidine] Other (See  Comments)    Reaction:  Hallucinations    . Dilaudid [Hydromorphone Hcl] Other (See Comments)    Reaction:  Hallucinations   . Levofloxacin Other (See Comments)    Reaction:  Unknown     Consultations:  None   Procedures/Studies: Dg Chest 1 View  Result Date: 06/24/2019 CLINICAL DATA:  Cough. EXAM: CHEST  1 VIEW COMPARISON:  06/21/2019 and CT from 03/30/2019 FINDINGS: Cardiac silhouette remains prominent for size but unchanged. Evidence for median sternotomy and prior CABG procedure. Chronic densities in the retrocardiac space related to large hiatal hernia and limited evaluation of this area on this single AP view. Visualized lungs are similar to the previous examination with central vascular prominence. No frank pulmonary edema. IMPRESSION: Chronic densities at the left lung base that are poorly characterized on this examination. Some of these densities are related to the hiatal hernia. Slightly prominent central vascular structures are unchanged. No frank pulmonary edema. Electronically Signed   By: Markus Daft M.D.   On: 06/24/2019 12:19   Dg Chest 2 View  Result Date: 07/05/2019 CLINICAL DATA:  Shortness of breath EXAM: CHEST - 2 VIEW COMPARISON:  06/24/2019 FINDINGS: Cardiomegaly status post median sternotomy. Underpenetrated frontal view. Unchanged small bilateral pleural effusions, left greater than right. No new or focal airspace opacity. The visualized skeletal structures are unremarkable. IMPRESSION: Underpenetrated frontal view. Unchanged small bilateral pleural effusions, left greater than right. No new or focal airspace opacity. Cardiomegaly. Electronically Signed   By: Eddie Candle M.D.   On: 07/05/2019 21:26   Dg Chest Port 1 View  Result Date: 06/21/2019 CLINICAL DATA:  Shortness of breath EXAM: PORTABLE CHEST 1 VIEW COMPARISON:  Radiograph 05/05/2019, CT 03/30/2019 FINDINGS: Basilar predominant interstitial opacities with septal lines, central cuffing and venous congestion.  Increased attenuation in the retrocardiac space is likely accentuated by a large hiatal hernia seen on prior studies. Cannot exclude underlying consolidation. Postsurgical changes related to prior CABG including intact and aligned sternotomy wires and multiple surgical clips projecting over the mediastinum. No acute osseous or soft tissue abnormality. IMPRESSION: 1. Basilar predominant interstitial opacities, central cuffing and venous congestion, concerning for pulmonary edema. 2. Increased attenuation in the retrocardiac space is likely accentuated by a large hiatal hernia seen on prior studies. Cannot exclude underlying consolidation. Electronically Signed   By: Lovena Le M.D.   On: 06/21/2019 05:03       Subjective: Complaints about the food this morning Complains of being in the hospital this morning Complained about his IV in his left arm Was hemodynamically stable overnight without complaint  Discharge Exam: Vitals:   07/08/19 0751 07/08/19 [id number]  BP:  (!) 153/84  Pulse:  (!) 101  Resp:  20  Temp:    SpO2: 91% 95%   Vitals:   07/08/19 0600 07/08/19 0625 07/08/19 0751 07/08/19 0947  BP: (!) 115/49 (!) 141/70  (!) 153/84  Pulse: 91 89  (!) 101  Resp:  18  20  Temp: 98.4 F (36.9 C) 97.8 F (36.6 C)    TempSrc:  Oral    SpO2: 94% 96% 91% 95%  Weight:  123.3 kg    Height:        General: Pt is alert, awake, not in acute distress although oppositional with staff Cardiovascular: RRR, S1/S2 +, no rubs, no gallops Respiratory: CTA bilaterally, no wheezing, no rhonchi lungs appear to be at baseline Abdominal: Soft, NT, ND, bowel sounds + Extremities: no edema, no cyanosis    The results of significant diagnostics from this hospitalization (including imaging, microbiology, ancillary and laboratory) are listed below for reference.     Microbiology: Recent Results (from the past 240 hour(s))  SARS Coronavirus 2 [facility] order, Performed in [place] hospital lab)      Status: None   Collection Time: 07/05/19  7:43 PM  Result Value Ref Range Status   SARS Coronavirus 2 NEGATIVE NEGATIVE Final    Comment: (NOTE) If result is NEGATIVE SARS-CoV-2 target nucleic acids are NOT DETECTED. The SARS-CoV-2 RNA is generally detectable in upper and lower  respiratory specimens during the acute phase of infection. The lowest  concentration of SARS-CoV-2 viral copies this assay can detect is 250  copies / mL. A negative result does not preclude SARS-CoV-2 infection  and should not be used as the sole basis for treatment or other  patient management decisions.  A negative result may occur with  improper specimen collection / handling, submission of specimen other  than nasopharyngeal swab, presence of viral mutation(s) within the  areas targeted by this assay, and inadequate number of viral copies  (<250 copies / mL). A negative result must be combined with clinical  observations, patient history, and epidemiological information. If result is POSITIVE SARS-CoV-2 target nucleic acids are DETECTED. The SARS-CoV-2 RNA is generally detectable in upper and lower  respiratory specimens dur ing the acute phase of infection.  Positive  results are indicative of active infection with SARS-CoV-2.  Clinical  correlation with patient history and other diagnostic information is  necessary to determine patient infection status.  Positive results do  not rule out bacterial infection or co-infection with other viruses. If result is PRESUMPTIVE POSTIVE SARS-CoV-2 nucleic acids MAY BE PRESENT.   A presumptive positive result was obtained on the submitted specimen  and confirmed on repeat testing.  While 2019 novel coronavirus  (SARS-CoV-2) nucleic acids may be present in the submitted sample  additional confirmatory testing may be necessary for epidemiological  and / or clinical management purposes  to differentiate between  SARS-CoV-2 and other Sarbecovirus currently known to  infect humans.  If clinically indicated additional testing with an alternate test  methodology [phone]) is advised. The SARS-CoV-2 RNA is generally  detectable in upper and lower respiratory sp ecimens during the acute  phase of infection. The expected result is Negative. Fact Sheet for Patients:  StrictlyIdeas.no Fact Sheet for Healthcare Providers: BankingDealers.co.za This test is not yet approved or cleared by the [place] FDA and has been authorized for detection and/or diagnosis of SARS-CoV-2 by FDA under an Emergency Use Authorization (EUA).  This EUA will remain in effect (meaning this test  can be used) for the duration of the COVID-19 declaration under Section 564(b)(1) of the Act, 21 U.S.C. section 360bbb-3(b)(1), unless the authorization is terminated or revoked sooner. Performed at [facility], [place] [address] [place], [place] [address]   Blood culture (routine x 2)     Status: None (Preliminary result)   Collection Time: 07/05/19 10:21 PM   Specimen: BLOOD  Result Value Ref Range Status   Specimen Description   Final    BLOOD LEFT ANTECUBITAL Performed at [facility] [address], [place], [place] [address]    Special Requests   Final    BOTTLES DRAWN AEROBIC AND ANAEROBIC Blood Culture adequate volume Performed at [facility] [address] [place], [place] [address]    Culture   Final    NO GROWTH 2 DAYS Performed at [facility] [address], [place], [place] [address]    Report Status PENDING  Incomplete  Blood culture (routine x 2)     Status: None (Preliminary result)   Collection Time: 07/05/19 10:26 PM   Specimen: BLOOD RIGHT HAND  Result Value Ref Range Status   Specimen Description   Final    BLOOD RIGHT HAND Performed at [facility], [place] [address], [place], [place] [address]    Special Requests   Final    BOTTLES  DRAWN AEROBIC AND ANAEROBIC Blood Culture adequate volume Performed at [place] [address] [place], [place] [address]    Culture   Final    NO GROWTH 2 DAYS Performed at [place] [address], [place], [place] [address]    Report Status PENDING  Incomplete     Labs: BNP (last 3 results) Recent Labs    03/30/19 0933 06/21/19 0459 07/05/19 1953  BNP 212.9* 87.9 123456*   Basic Metabolic Panel: Recent Labs  Lab 07/05/19 1953 07/05/19 2242 07/06/19 0830 07/07/19 0254 07/08/19 0234  NA 137  --  136 134* 131*  K 5.3* 4.9 5.0 4.8 3.9  CL 91*  --  85* 88* 85*  CO2 37*  --  37* 32 32  GLUCOSE 171*  --  352* 310* 234*  BUN 11  --  16 26* 41*  CREATININE 1.07  --  1.11 1.27* 1.49*  CALCIUM 8.8*  --  9.2 9.3 8.9   Liver Function Tests: Recent Labs  Lab 07/06/19 0830  AST 11*  ALT 17  ALKPHOS 120  BILITOT 0.4  PROT 7.2  ALBUMIN 3.3*   No results for input(s): LIPASE, AMYLASE in the last 168 hours. No results for input(s): AMMONIA in the last 168 hours. CBC: Recent Labs  Lab 07/05/19 1953 07/06/19 0830 07/07/19 0254 07/08/19 0234  WBC 10.1 8.4 10.7* 10.0  NEUTROABS  --   --  9.9* 9.1*  HGB 10.1* 9.5* 9.5* 9.1*  HCT 35.5* 32.9* 31.7* 30.3*  MCV 93.7 91.1 88.5 87.8  PLT 185 185 175 176   Cardiac Enzymes: No results for input(s): CKTOTAL, CKMB, CKMBINDEX, TROPONINI in the last 168 hours. BNP: Invalid input(s): POCBNP CBG: Recent Labs  Lab 07/06/19 2102 07/07/19 0735 07/07/19 1209 07/07/19 1701 07/08/19 0738  GLUCAP 342* 309* 300* 164* 261*   D-Dimer No results for input(s): DDIMER in the last 72 hours. Hgb A1c No results for input(s): HGBA1C in the last 72 hours. Lipid Profile No results for input(s): CHOL, HDL, LDLCALC, TRIG, CHOLHDL, LDLDIRECT in the last 72 hours. Thyroid  function studies No results for input(s): TSH, T4TOTAL, T3FREE, THYROIDAB in the last 72 hours.  Invalid input(s): FREET3 Anemia work  up No results for input(s): VITAMINB12, FOLATE, FERRITIN, TIBC, IRON, RETICCTPCT in the last 72 hours. Urinalysis    Component Value Date/Time   COLORURINE YELLOW 07/06/2019 0830   APPEARANCEUR CLEAR 07/06/2019 0830   LABSPEC 1.010 07/06/2019 0830   PHURINE 6.0 07/06/2019 0830   GLUCOSEU >=500 (A) 07/06/2019 0830   HGBUR SMALL (A) 07/06/2019 0830   BILIRUBINUR NEGATIVE 07/06/2019 0830   BILIRUBINUR small 02/13/2015 1354   KETONESUR 5 (A) 07/06/2019 0830   PROTEINUR 100 (A) 07/06/2019 0830   UROBILINOGEN 0.2 02/13/2015 1354   UROBILINOGEN 0.2 08/24/2014 1948   NITRITE NEGATIVE 07/06/2019 0830   LEUKOCYTESUR MODERATE (A) 07/06/2019 0830   Sepsis Labs Invalid input(s): PROCALCITONIN,  WBC,  [place] Microbiology Recent Results (from the past 240 hour(s))  SARS Coronavirus 2 [facility] order, Performed in [place] hospital lab)     Status: None   Collection Time: 07/05/19  7:43 PM  Result Value Ref Range Status   SARS Coronavirus 2 NEGATIVE NEGATIVE Final    Comment: (NOTE) If result is NEGATIVE SARS-CoV-2 target nucleic acids are NOT DETECTED. The SARS-CoV-2 RNA is generally detectable in upper and lower  respiratory specimens during the acute phase of infection. The lowest  concentration of SARS-CoV-2 viral copies this assay can detect is 250  copies / mL. A negative result does not preclude SARS-CoV-2 infection  and should not be used as the sole basis for treatment or other  patient management decisions.  A negative result may occur with  improper specimen collection / handling, submission of specimen other  than nasopharyngeal swab, presence of viral mutation(s) within the  areas targeted by this assay, and inadequate number of viral copies  (<250 copies / mL). A negative result must be combined with clinical  observations, patient history, and epidemiological information. If result is POSITIVE SARS-CoV-2 target nucleic acids are DETECTED. The SARS-CoV-2 RNA is  generally detectable in upper and lower  respiratory specimens dur ing the acute phase of infection.  Positive  results are indicative of active infection with SARS-CoV-2.  Clinical  correlation with patient history and other diagnostic information is  necessary to determine patient infection status.  Positive results do  not rule out bacterial infection or co-infection with other viruses. If result is PRESUMPTIVE POSTIVE SARS-CoV-2 nucleic acids MAY BE PRESENT.   A presumptive positive result was obtained on the submitted specimen  and confirmed on repeat testing.  While 2019 novel coronavirus  (SARS-CoV-2) nucleic acids may be present in the submitted sample  additional confirmatory testing may be necessary for epidemiological  and / or clinical management purposes  to differentiate between  SARS-CoV-2 and other Sarbecovirus currently known to infect humans.  If clinically indicated additional testing with an alternate test  methodology [phone]) is advised. The SARS-CoV-2 RNA is generally  detectable in upper and lower respiratory sp ecimens during the acute  phase of infection. The expected result is Negative. Fact Sheet for Patients:  StrictlyIdeas.no Fact Sheet for Healthcare Providers: BankingDealers.co.za This test is not yet approved or cleared by the [place] FDA and has been authorized for detection and/or diagnosis of SARS-CoV-2 by FDA under an Emergency Use Authorization (EUA).  This EUA will remain in effect (meaning this test can be used) for the duration of the COVID-19 declaration under Section 564(b)(1) of the Act, 21 U.S.C. section 360bbb-3(b)(1), unless the authorization is  terminated or revoked sooner. Performed at [facility], [place] [address], [place], [place] [address]   Blood culture (routine x 2)     Status: None (Preliminary result)   Collection Time: 07/05/19 10:21 PM   Specimen: BLOOD   Result Value Ref Range Status   Specimen Description   Final    BLOOD LEFT ANTECUBITAL Performed at [facility] [address], [place], [place] [address]    Special Requests   Final    BOTTLES DRAWN AEROBIC AND ANAEROBIC Blood Culture adequate volume Performed at [facility] [address] [place], [place] [address]    Culture   Final    NO GROWTH 2 DAYS Performed at [facility] [address] [place], [place] [address]    Report Status PENDING  Incomplete  Blood culture (routine x 2)     Status: None (Preliminary result)   Collection Time: 07/05/19 10:26 PM   Specimen: BLOOD RIGHT HAND  Result Value Ref Range Status   Specimen Description   Final    BLOOD RIGHT HAND Performed at [facility], [place] [address], [place], [place] [address]    Special Requests   Final    BOTTLES DRAWN AEROBIC AND ANAEROBIC Blood Culture adequate volume Performed at [facility] [address] [place], [place] [address]    Culture   Final    NO GROWTH 2 DAYS Performed at [facility] [address] [place], [place] [address]    Report Status PENDING  Incomplete     Time coordinating discharge: Over 30 minutes  SIGNED:   Nicolette Bang, MD  [facility] 07/08/2019, 10:47 AM Pager   If 7PM-7AM, please contact night-coverage [email or web address] Password [email or web address]

## 2019-07-08 NOTE — Progress Notes (Signed)
Oxygen tanks were delivered. Patients spouse was able to locate patients home O2 tank. Pt refused to take additional tanks.

## 2019-07-08 NOTE — Progress Notes (Signed)
Patient has voided a total of 400 cc.

## 2019-07-08 NOTE — Progress Notes (Signed)
Discharge instructions have been reviewed, questions and or concerns denied.  Pts spouse contacted & request oxygen tank for dc home

## 2019-07-08 NOTE — TOC Transition Note (Signed)
Transition of Care [facility]) - CM/SW Discharge Note   Patient Details  Name: [name] MRN: [id number] Date of Birth: 1941/03/26  [facility] CM/SW Contact:  Dessa Phi, RN Phone Number: 07/08/2019, 1:19 PM   Clinical Narrative:  Patient active w/[employer] for home 02-patient has travel tank-his wife will bring @ d/c. No further CM needs.           Patient Goals and CMS Choice        Discharge Placement                       Discharge Plan and Services                                     Social Determinants of Health (SDOH) Interventions     Readmission Risk Interventions Readmission Risk Prevention Plan 04/13/2019  Transportation Screening Complete  Medication Review ([place]) Complete  PCP or Specialist appointment within 3-5 days of discharge Complete  HRI or [place] Complete  SW Recovery Care/Counseling Consult Complete  Palliative Care Screening Patient [place] Patient Refused  Some recent data might be hidden

## 2019-07-08 NOTE — Progress Notes (Addendum)
Patient has been discharged. Foley Catheter has been discontinued. Pt due to void. Patients spouse [name] updated regarding plan of care. No questions at this time.

## 2019-07-11 LAB — CULTURE, BLOOD (ROUTINE X 2)
Culture: NO GROWTH
Culture: NO GROWTH
Special Requests: ADEQUATE
Special Requests: ADEQUATE

## 2019-07-17 ENCOUNTER — Telehealth: Payer: Self-pay | Admitting: Licensed Clinical Social Worker

## 2019-07-17 NOTE — Telephone Encounter (Signed)
Palliative Care SW spoke with patient and scheduled a home visit with the NP for Thursday, 10/8, at 9:30am.  SW provided active listening and supportive counseling while he discussed his recent hospital stay.

## 2019-07-25 ENCOUNTER — Other Ambulatory Visit: Payer: Medicare HMO | Admitting: Hospice

## 2019-07-25 ENCOUNTER — Other Ambulatory Visit: Payer: Medicare HMO | Admitting: Licensed Clinical Social Worker

## 2019-07-25 ENCOUNTER — Other Ambulatory Visit: Payer: Self-pay

## 2019-07-25 DIAGNOSIS — Z515 Encounter for palliative care: Secondary | ICD-10-CM

## 2019-07-25 NOTE — Progress Notes (Signed)
[facility] Consult Note Telephone: [phone]  Fax: [phone]  PATIENT NAME: [name] DOB: 10-17-1941 MRN: [id number]  PRIMARY CARE PROVIDER:   [name], MD  REFERRING PROVIDER:  [name], MD [address] [place],  [place] [address]   RESPONSIBLE PARTY:   Self [phone] Wife, [place]    RECOMMENDATIONS/PLAN:  1. Advance Care Planning/Goals of Care: Joint visit with Social Worker Evonnie Dawes. Building trust, allowing open conversation on patient's understanding of his health. He affirmed full scope of treatment, Full code; Michela Pitcher he was last in the hospital 07/05/2019 for acute respiratory failure with Hypercapnia and that he goes often to the hospital. He expressed new confidence in his PCP and in current COPD treatment that includes CPAP. He said he feels better than before having googled and learned more about excess carbon dioxide in the body. His goals include to maximize quality of life and symptom management. He requested another visit in a month and weekly nurse check in.    2. Symptom management: Respiratory distress r/t COPD.  He is currently compliant with his oxygen use, breathing treatment and use of CPAP. He is feeling a lot better, no respiratory distress during visit/conversations, his standing up and using his urinal in an appropriate manner. He said he was having difficulty putting on CPCP before but he has mastered the art and is now able to use it as ordered, with good result. Validation and therapeutic listening provided. Education also provided on pursed lip breathing; he was receptive.   No CHF exacerbation at this time. He continues to take his Lasix as ordered.   3. Follow up Palliative Care Visit: Palliative care will continue to follow for goals of care clarification and symptom management.   I spent 65 minutes providing this consultation, from 9.30am to 10.35am. More  than 50% of the time in this consultation was spent on coordinating advance care communication.  HISTORY OF PRESENT ILLNESS:  [name] is a 78 y.o. year old male with multiple medical problems including end stage COPD, CHF, HTN, CAD, DMT2, bipolar. Palliative Care was asked to help address goals of care.   CODE STATUS: Full  PPS: 60% HOSPICE ELIGIBILITY/DIAGNOSIS: TBD  PAST MEDICAL HISTORY:  Past Medical History:  Diagnosis Date  . AAA (abdominal aortic aneurysm) ([place])    a. 12/2008 s/p 7cm, endovascular repair with coiling right hypogastric artery   . Acute [place] ulcer   . Adenomatous duodenal polyp   . Allergic rhinitis, cause unspecified   . Anxiety   . AVM (arteriovenous malformation) of colon with hemorrhage   . Bipolar 1 disorder, mixed, moderate ([place]) 04/16/2015  . CAD (coronary artery disease)    a. 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  . Chronic diastolic CHF (congestive heart failure) ([place])    a. 04/2015 Echo: EF 55-60%, no rwma, Gr 1 DD, mild AI.  Marland Kitchen Complication of anesthesia    "if they sedate me for too long, they have to intubate me; then they can't get me to come out of it" (04/03/2017)  . COPD (chronic obstructive pulmonary disease) ([place])    a. GOLD stage IV, started home O2. Severe bullous disease of LUL. Prolonged intubation after surgeries due to COPD.  Marland Kitchen Depression with anxiety 01/14/2013  . Diabetes mellitus with complication ([place])   . Diverticulosis   . Duodenal diverticulum   . Duodenal ulcer   . Emphysema of lung ([place])   .  Esophagitis   . Essential hypertension 08/18/2009   Qualifier: Diagnosis of  By: Doy Mince LPN, Megan    . GERD (gastroesophageal reflux disease)   . GI bleed requiring more than 4 units of blood in 24 hours, ICU, or surgery    a. Hx bleeding gastric polyps, cecal & sigmoid AVMS s/p APC 03/30/14  . Hiatal hernia    large  . History of blood transfusion    "many many many; related to blood loss;  anemia"  . Hyperlipidemia   . Insomnia 08/10/2014  . Leucocytosis 12/04/2013  . Major depressive disorder   . Memory loss   . Morbid obesity ([place])   . Multiple gastric polyps   . Myocardial infarction ([place])    "I think I had a minor one when I had the OHS"  . On home oxygen therapy    "7 liters  w/oxigenator" (04/03/2017)  . Pneumonia 2017  . Recurrent Microcytic Anemia    a. presumed chronic GI blood loss.  . Type II diabetes mellitus ([place])   . Vitamin D deficiency 08/10/2014    SOCIAL HX:  Social History   Tobacco Use  . Smoking status: Former Smoker    Packs/day: 2.00    Years: 50.00    Pack years: 100.00    Types: Cigarettes    Quit date: 11/18/2008    Years since quitting: 10.6  . Smokeless tobacco: Never Used  Substance Use Topics  . Alcohol use: No    Alcohol/week: 0.0 standard drinks    Comment: quit in ~ 2010    ALLERGIES:  Allergies  Allergen Reactions  . Morphine And Related Shortness Of Breath, Nausea And Vomiting, Rash and Other (See Comments)    Reaction:  Hallucinations   . Penicillins Anaphylaxis, Hives and Other (See Comments)    10/02/18 - discussed with patient and he states he can tolerate penicillin capsules and had some hives when he was 78yo and received pcn injections  Has patient had a PCN reaction causing immediate rash, facial/tongue/throat swelling, SOB or lightheadedness with hypotension: Yes Has patient had a PCN reaction causing severe rash involving mucus membranes or skin necrosis: No Has patient had a PCN reaction that required hospitalization No Has patient had a PCN reaction occurring within the last 10 y  . Zolpidem Shortness Of Breath  . Demerol [Meperidine] Other (See Comments)    Reaction:  Hallucinations    . Dilaudid [Hydromorphone Hcl] Other (See Comments)    Reaction:  Hallucinations   . Levofloxacin Other (See Comments)    Reaction:  Unknown      PERTINENT MEDICATIONS:  Outpatient Encounter Medications as of 07/25/2019   Medication Sig  . acetaminophen (TYLENOL) 500 MG tablet Take 1,000 mg by mouth every 6 (six) hours as needed for fever or headache (pain).  Marland Kitchen albuterol (PROVENTIL HFA;VENTOLIN HFA) 108 (90 BASE) MCG/ACT inhaler Inhale 2 puffs into the lungs every 6 (six) hours as needed for wheezing or shortness of breath.  . ALPRAZolam (XANAX) 0.25 MG tablet Take 1 tablet (0.25 mg total) by mouth 3 (three) times daily as needed for anxiety or sleep. (Patient not taking: Reported on 07/05/2019)  . atorvastatin (LIPITOR) 10 MG tablet Take 1 tablet (10 mg total) by mouth daily at 6 PM. (Patient not taking: Reported on 07/05/2019)  . benzonatate (TESSALON) 100 MG capsule Take 100 mg by mouth every 8 (eight) hours as needed for cough or congestion.  . citalopram (CELEXA) 40 MG tablet Take 0.5 tablets (20 mg  total) by mouth daily. (Patient taking differently: Take 40 mg by mouth daily. )  . [employer] MELATONIN 5 MG TABS Take 5 mg by mouth at bedtime. For sleep  . docusate sodium (COLACE) 100 MG capsule Take 1 capsule (100 mg total) by mouth 2 (two) times daily. (Patient taking differently: Take 100 mg by mouth 2 (two) times daily as needed for mild constipation. )  . fluticasone (FLONASE) 50 MCG/ACT nasal spray Place 2 sprays into both nostrils daily. (Patient taking differently: Place 2 sprays into both nostrils daily as needed for allergies or rhinitis. )  . furosemide (LASIX) 20 MG tablet Take 3 tablets (60 mg total) by mouth daily. (Patient taking differently: Take 40 mg by mouth 2 (two) times daily as needed for fluid (swelling). )  . gabapentin (NEURONTIN) 400 MG capsule Take 1 capsule (400 mg total) by mouth 3 (three) times daily. (Patient not taking: Reported on 07/05/2019)  . gabapentin (NEURONTIN) 800 MG tablet Take 400 mg by mouth 4 (four) times daily.  . insulin aspart (NOVOLOG) 100 UNIT/ML injection Inject 25 Units into the skin 3 (three) times daily with meals.  . insulin glargine (LANTUS) 100 UNIT/ML injection  Inject 0.35 mLs (35 Units total) into the skin 2 (two) times daily.  Marland Kitchen ipratropium-albuterol (DUONEB) 0.5-2.5 (3) MG/3ML SOLN He is used 3 times daily for next 5 days, then 3 times daily as needed (Patient taking differently: Take 3 mLs by nebulization 3 (three) times daily as needed (sob and wheezing). He is used 3 times daily for next 5 days, then 3 times daily as needed)  . lisinopril (ZESTRIL) 2.5 MG tablet Take 1 tablet (2.5 mg total) by mouth daily.  Marland Kitchen loperamide (IMODIUM A-D) 2 MG tablet Take 2 mg by mouth 4 (four) times daily as needed for diarrhea or loose stools.  Marland Kitchen loratadine (CLARITIN) 10 MG tablet Take 1 tablet (10 mg total) by mouth daily. (Patient not taking: Reported on 07/05/2019)  . metoprolol tartrate (LOPRESSOR) 25 MG tablet Take 0.5 tablets (12.5 mg total) by mouth 2 (two) times daily. (Patient not taking: Reported on 07/05/2019)  . mometasone-formoterol (DULERA) 200-5 MCG/ACT AERO Inhale 2 puffs into the lungs 2 (two) times daily as needed for wheezing or shortness of breath.  . pantoprazole (PROTONIX) 40 MG tablet Take 1 tablet (40 mg total) by mouth 2 (two) times daily.  . predniSONE (STERAPRED UNI-PAK 21 TAB) 5 MG (21) TBPK tablet Per package insert  . propranolol (INDERAL) 10 MG tablet Take 10 mg by mouth daily.  Marland Kitchen tiotropium (SPIRIVA) 18 MCG inhalation capsule Place 18 mcg into inhaler and inhale daily.  Marland Kitchen tiZANidine (ZANAFLEX) 2 MG tablet Take 2 mg by mouth 2 (two) times daily as needed for muscle spasms.   No facility-administered encounter medications on file as of 07/25/2019.     ROM/PHYSICAL EXAM:   General: In no acute distress, cooperative Cardiovascular: regular rate and rhythm, denied pain/discomfort Pulmonary: clear lung sounds, normal respiratory effort, on Oxygen 5L/Min, no crackles/wheezing Abdomen: soft, nontender, + bowel sounds GU: no suprapubic; uses urinal Extremities: no edema, no joint deformities Skin: no rashes/wounds on exposed skin  Neurological: Weakness, alert and oriented x 4  Teodoro Spray, NP

## 2019-07-26 NOTE — Progress Notes (Signed)
COMMUNITY PALLIATIVE CARE SW NOTE  PATIENT NAME: [name] DOB: 08-23-1941 MRN: [id number]  PRIMARY CARE PROVIDER: [name], MD  RESPONSIBLE PARTY:  Acct ID - Guarantor Home Phone Work Phone Relationship Acct Type  [id number] [name]  Self P/F     [place], [place], [place] [address]     PLAN OF CARE and INTERVENTIONS:             1. GOALS OF CARE/ ADVANCE CARE PLANNING:  Patient's goal is to remain in his house.  He has a MOST form and is a full code. 2. SOCIAL/EMOTIONAL/SPIRITUAL ASSESSMENT/ INTERVENTIONS:  SW and NP, [place], met with patient in his home.  He stated he thought the visit was scheduled for another date.  He denied pain.  Patient was alert and oriented.  SW provided active listening and supportive counseling while he discussed his recent admissions to the hospital.  He finds comfort in his pets.  He reports being on 5L of O2 and uses a CPAP at night. 3. PATIENT/CAREGIVER EDUCATION/ COPING:  Patient copes by attempting to control his situation. 4. PERSONAL EMERGENCY PLAN:  Patient or his wife will contact EMS. 5. COMMUNITY RESOURCES COORDINATION/ HEALTH CARE NAVIGATION:  None. 6. FINANCIAL/LEGAL CONCERNS/INTERVENTIONS:  None.     SOCIAL HX:  Social History   Tobacco Use  . Smoking status: Former Smoker    Packs/day: 2.00    Years: 50.00    Pack years: 100.00    Types: Cigarettes    Quit date: 11/18/2008    Years since quitting: 10.6  . Smokeless tobacco: Never Used  Substance Use Topics  . Alcohol use: No    Alcohol/week: 0.0 standard drinks    Comment: quit in ~ 2010    CODE STATUS:   ADVANCED DIRECTIVES:  MOST FORM COMPLETE: HOSPICE EDUCATION PROVIDED:  PPS:  Patient's appetite is normal.  He ambulates with a walker. Duration of visit and documentation:  75 minutes.      Creola Corn Cristle Jared, LCSW

## 2019-07-30 ENCOUNTER — Emergency Department (HOSPITAL_COMMUNITY): Payer: Medicare HMO

## 2019-07-30 ENCOUNTER — Other Ambulatory Visit: Payer: Self-pay

## 2019-07-30 ENCOUNTER — Emergency Department (HOSPITAL_COMMUNITY)
Admission: EM | Admit: 2019-07-30 | Discharge: 2019-07-30 | Disposition: A | Discharge status: Home / Self Care | Payer: Medicare HMO | Attending: Emergency Medicine | Admitting: Emergency Medicine

## 2019-07-30 DIAGNOSIS — J449 Chronic obstructive pulmonary disease, unspecified: Secondary | ICD-10-CM | POA: Diagnosis not present

## 2019-07-30 DIAGNOSIS — Y92013 Bedroom of single-family (private) house as the place of occurrence of the external cause: Secondary | ICD-10-CM | POA: Insufficient documentation

## 2019-07-30 DIAGNOSIS — Y9301 Activity, walking, marching and hiking: Secondary | ICD-10-CM | POA: Diagnosis not present

## 2019-07-30 DIAGNOSIS — Z79899 Other long term (current) drug therapy: Secondary | ICD-10-CM | POA: Insufficient documentation

## 2019-07-30 DIAGNOSIS — I2510 Atherosclerotic heart disease of native coronary artery without angina pectoris: Secondary | ICD-10-CM | POA: Insufficient documentation

## 2019-07-30 DIAGNOSIS — M25562 Pain in left knee: Secondary | ICD-10-CM | POA: Insufficient documentation

## 2019-07-30 DIAGNOSIS — M545 Low back pain: Secondary | ICD-10-CM | POA: Diagnosis not present

## 2019-07-30 DIAGNOSIS — Z951 Presence of aortocoronary bypass graft: Secondary | ICD-10-CM | POA: Diagnosis not present

## 2019-07-30 DIAGNOSIS — Z87891 Personal history of nicotine dependence: Secondary | ICD-10-CM | POA: Insufficient documentation

## 2019-07-30 DIAGNOSIS — E119 Type 2 diabetes mellitus without complications: Secondary | ICD-10-CM | POA: Diagnosis not present

## 2019-07-30 DIAGNOSIS — W1830XA Fall on same level, unspecified, initial encounter: Secondary | ICD-10-CM | POA: Insufficient documentation

## 2019-07-30 DIAGNOSIS — I10 Essential (primary) hypertension: Secondary | ICD-10-CM | POA: Diagnosis not present

## 2019-07-30 DIAGNOSIS — Y999 Unspecified external cause status: Secondary | ICD-10-CM | POA: Insufficient documentation

## 2019-07-30 DIAGNOSIS — I5032 Chronic diastolic (congestive) heart failure: Secondary | ICD-10-CM | POA: Diagnosis not present

## 2019-07-30 DIAGNOSIS — M546 Pain in thoracic spine: Secondary | ICD-10-CM | POA: Insufficient documentation

## 2019-07-30 DIAGNOSIS — Z794 Long term (current) use of insulin: Secondary | ICD-10-CM | POA: Insufficient documentation

## 2019-07-30 DIAGNOSIS — M25552 Pain in left hip: Secondary | ICD-10-CM | POA: Insufficient documentation

## 2019-07-30 DIAGNOSIS — W19XXXA Unspecified fall, initial encounter: Secondary | ICD-10-CM

## 2019-07-30 DIAGNOSIS — S79912A Unspecified injury of left hip, initial encounter: Secondary | ICD-10-CM | POA: Diagnosis present

## 2019-07-30 IMAGING — CR CHEST - 2 VIEW
2 series · 2 of 2 positions shown · non-contrast
Comparison: 12/26/2018

CLINICAL DATA: Increasing shortness of breath over several days

EXAM:
CHEST - 2 VIEW

[w chest pa]
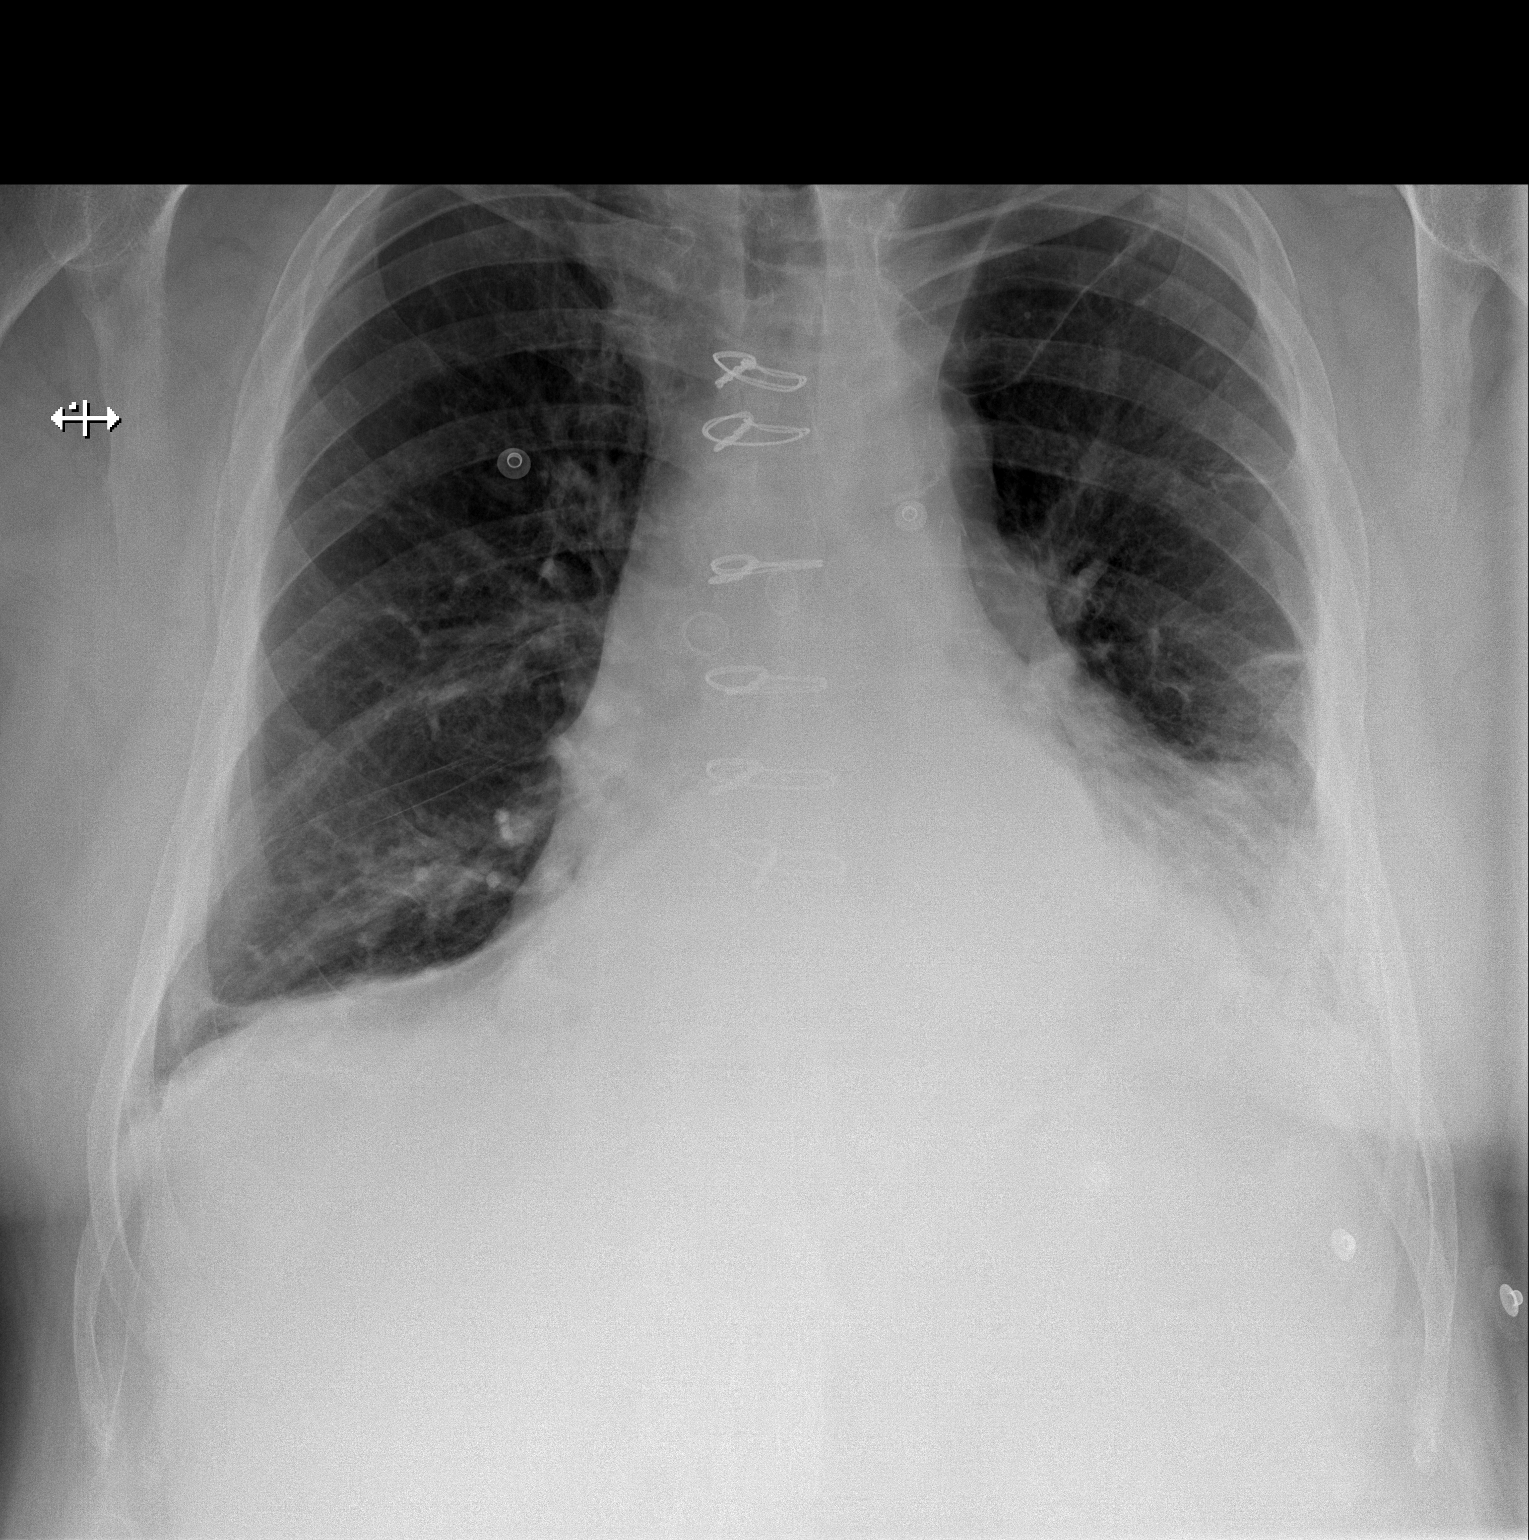

[w chest lat]
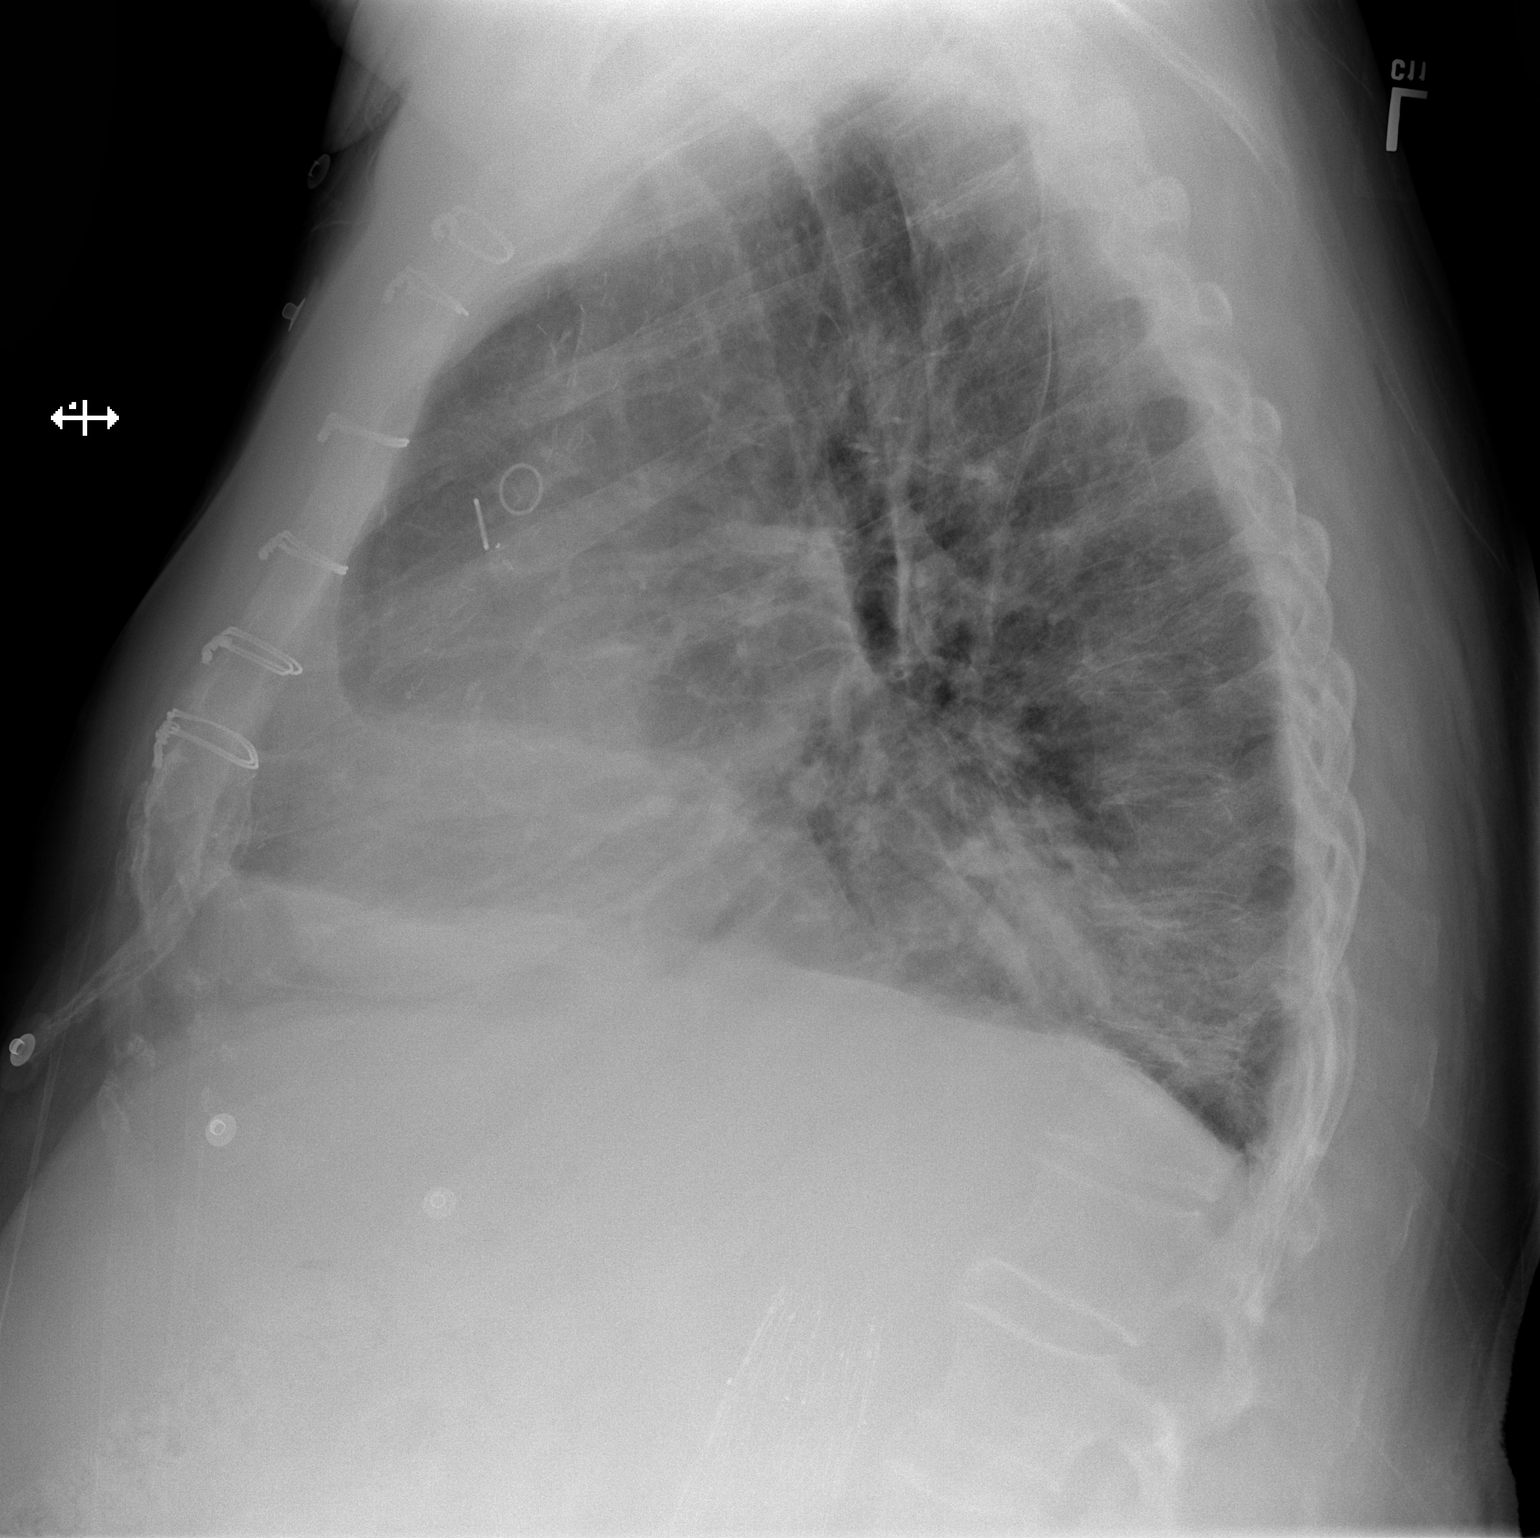

[2 of 2 positions shown; findings below may reference images not displayed]

FINDINGS: Cardiac shadow is prominent but stable. Postsurgical changes are
again seen. Right basilar atelectasis is noted. Persistent left
basilar infiltrate is seen. No acute bony abnormality is noted.
IMPRESSION: Persistent left basilar infiltrate.

## 2019-07-30 IMAGING — DX PORTABLE CHEST - 1 VIEW
2 series · 2 of 2 positions shown · non-contrast
Comparison: Chest radiograph October 11, 2018 and chest CT
October 13, 2018

CLINICAL DATA: Shortness of breath and hyperglycemia

EXAM:
PORTABLE CHEST 1 VIEW

[chest ap (1 of 2)]
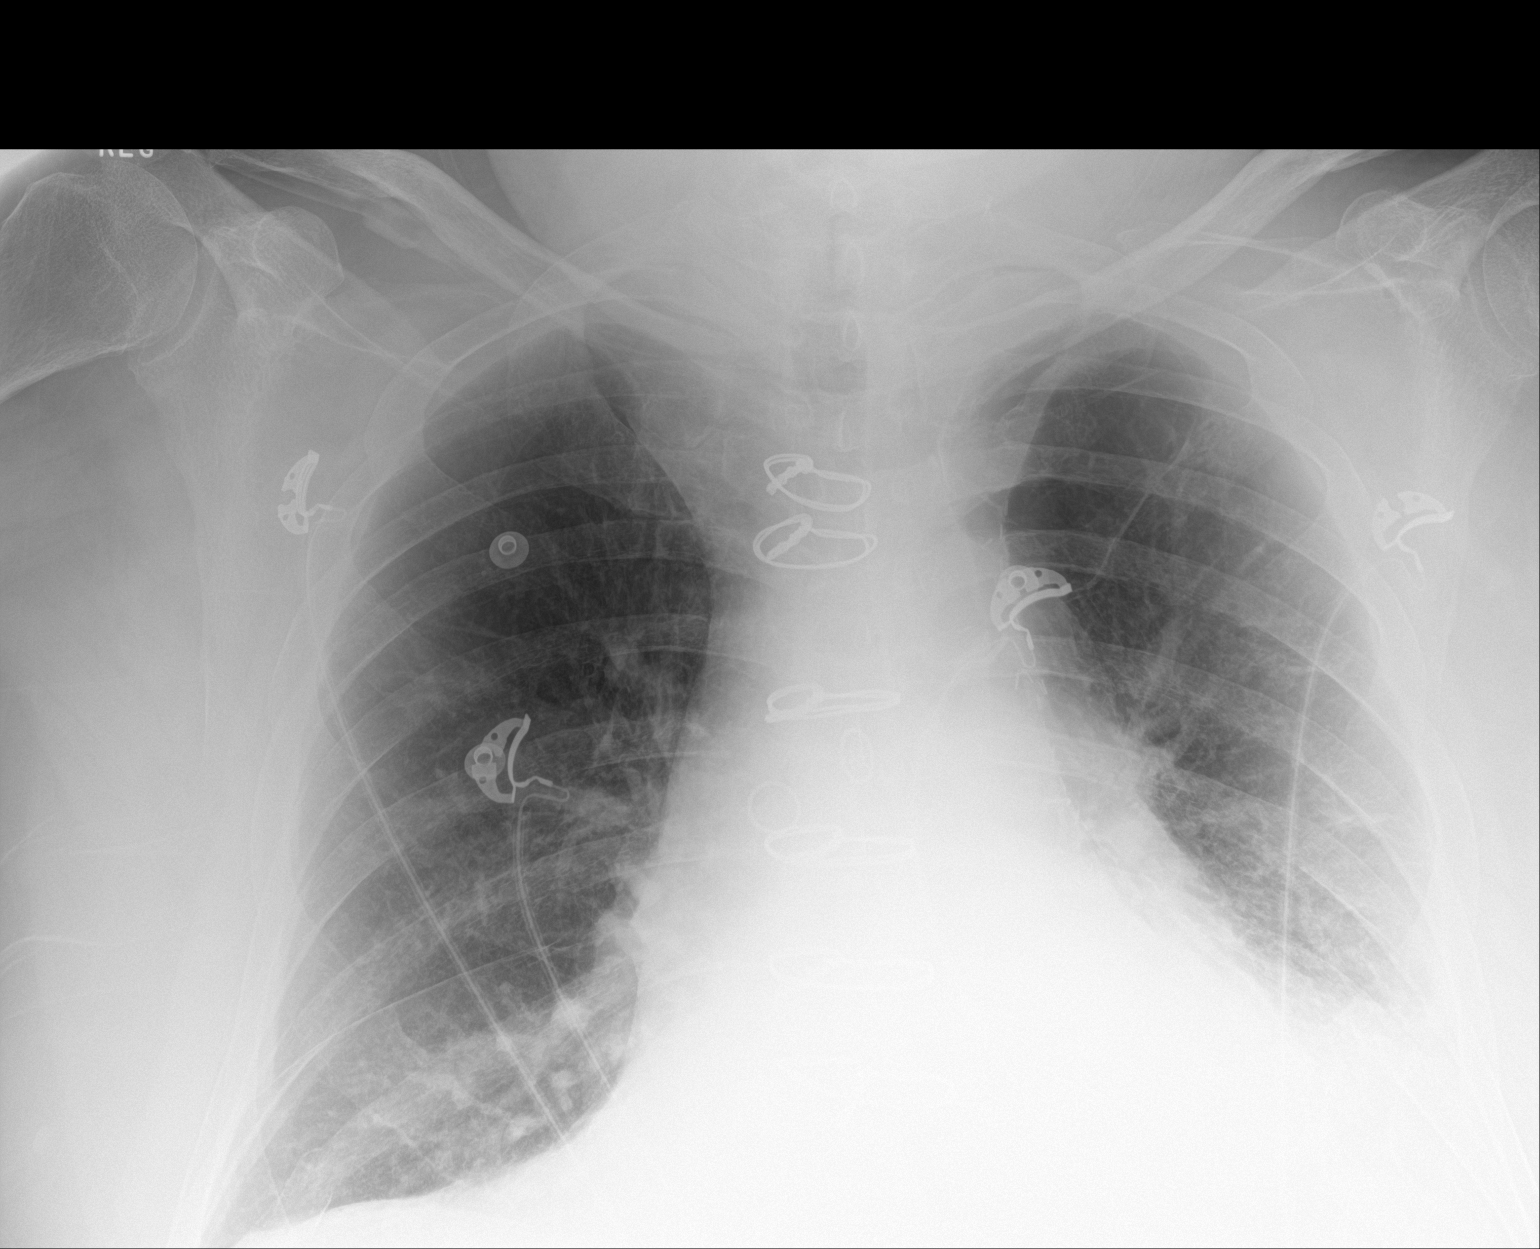

[chest ap (2 of 2)]
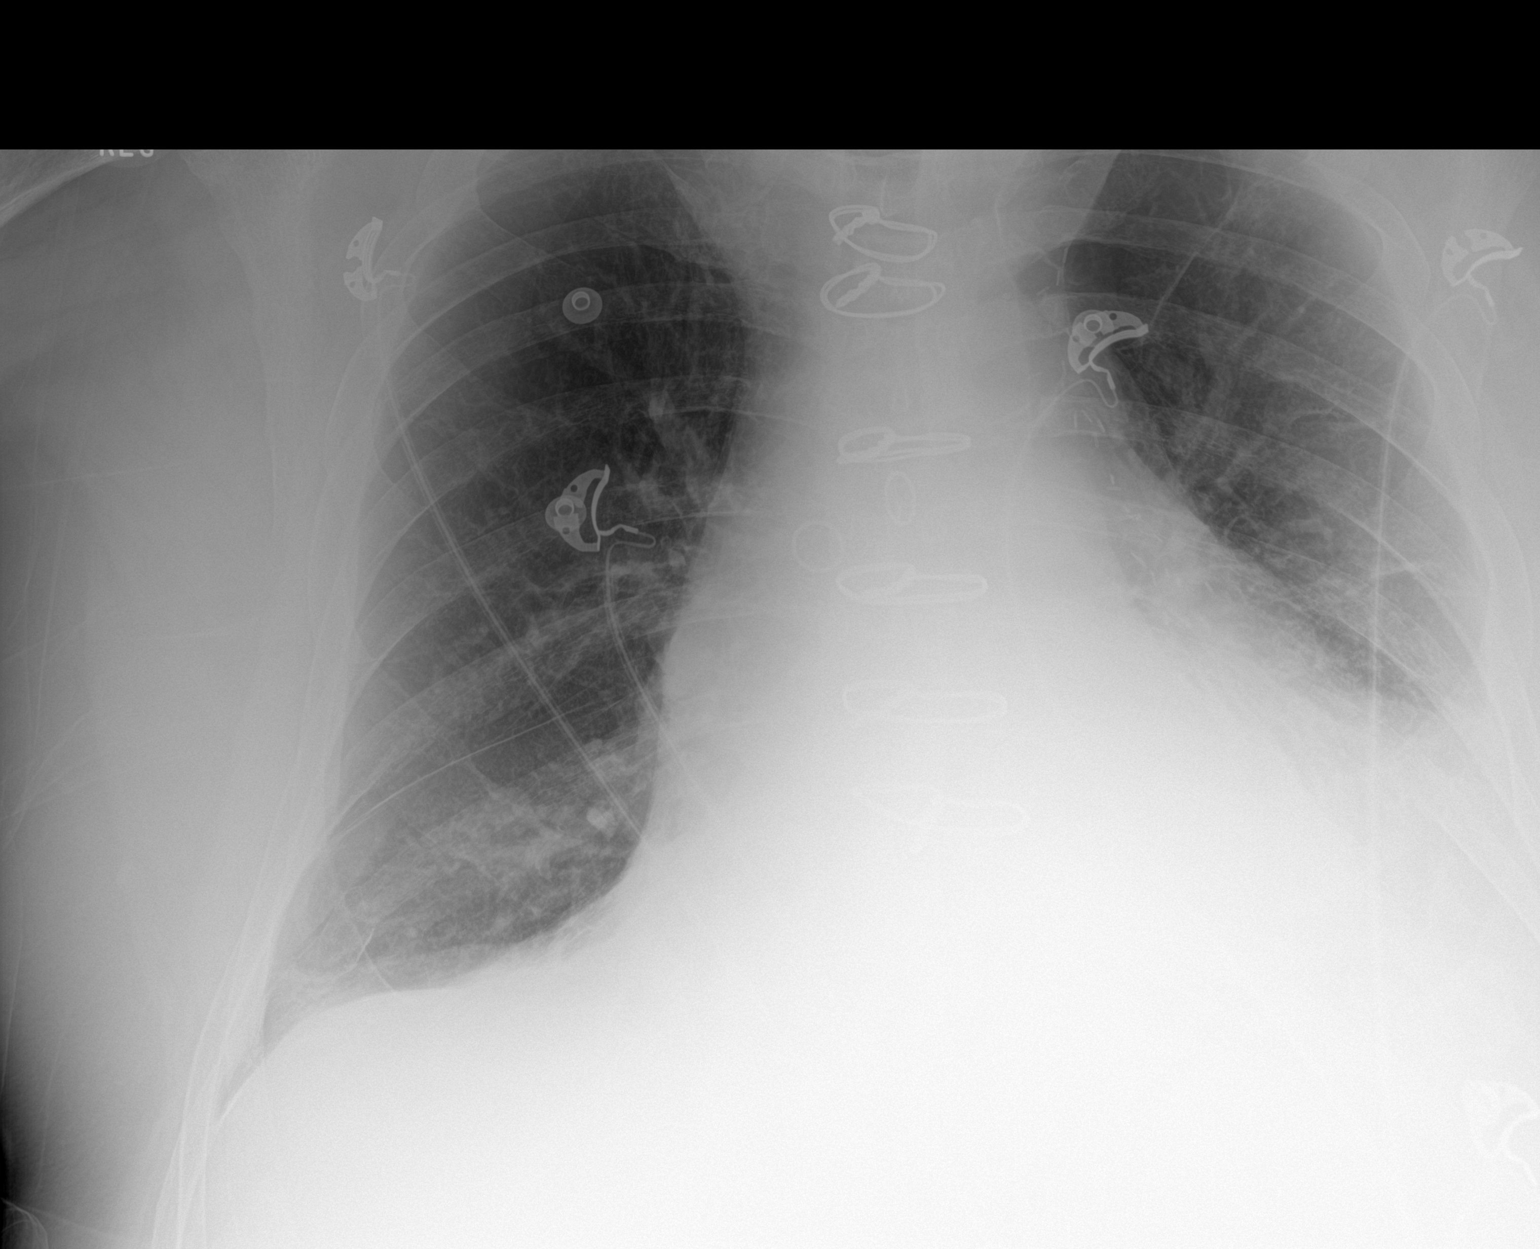

[2 of 2 positions shown; findings below may reference images not displayed]

FINDINGS: Bullous disease in the upper lobes is again noted. There is left
lower lobe airspace consolidation with small left pleural effusion.
The right lung is clear. Heart is mildly enlarged. The pulmonary
vascularity is stable with diminished vascularity to the upper lobes
due to the bullous disease. Patient is status post coronary artery
bypass grafting. There is aortic atherosclerosis. No adenopathy. No
bone lesions.
IMPRESSION: Upper lobe bullous disease. Left lower lobe consolidation consistent
with pneumonia with small left pleural effusion. Stable cardiac
prominence. Status post coronary artery bypass grafting. Aortic
Atherosclerosis ([id number]).

## 2019-07-30 MED ORDER — ACETAMINOPHEN 500 MG PO TABS
1000.0000 mg | ORAL_TABLET | Freq: Once | ORAL | Status: AC
  Administered 2019-07-30: 20:00:00 1000 mg via ORAL
  Filled 2019-07-30: qty 2

## 2019-07-30 NOTE — ED Notes (Addendum)
Pt ambulated with minimal assistance using walker, gait steady.

## 2019-07-30 NOTE — Discharge Instructions (Signed)
Continue to take tylenol extra strength for your pain but do not exceed 4000mg  of tylenol in a 24 hour period.

## 2019-07-30 NOTE — ED Provider Notes (Addendum)
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 07/30/19 2149    Maudie Flakes, MD 07/30/19 [phone]

## 2019-07-30 NOTE — ED Triage Notes (Signed)
Pt arrives via EMS from home with bilateral hip pain, left knee. Pt reports recent fall going to the bathroom. Neg Loc. VSS. Pt able to ambulate with assistance. No deformities noted.

## 2019-07-30 NOTE — ED Notes (Signed)
Patient verbalizes understanding of discharge instructions. Opportunity for questioning and answers were provided. Armband removed by staff, pt discharged from ED with PTAR. Patient very vocal about not wanting to leave and wanting to be admitted. Both provider and this RN explained to patient that he did not meet requirements to be admitted. Patient stating, " I have been through this horse and pony show before and this shit is ridiculous." When offered the number to patient satisfaction, patient stated, " I know exactly how to deal with this shit." Emotional support given and patient discharged with PTAR.

## 2019-07-31 ENCOUNTER — Emergency Department (HOSPITAL_COMMUNITY)
Admission: EM | Admit: 2019-07-31 | Discharge: 2019-07-31 | Disposition: A | Discharge status: Home / Self Care | Payer: Medicare HMO | Attending: Emergency Medicine | Admitting: Emergency Medicine

## 2019-07-31 ENCOUNTER — Encounter (HOSPITAL_COMMUNITY): Payer: Self-pay

## 2019-07-31 DIAGNOSIS — I110 Hypertensive heart disease with heart failure: Secondary | ICD-10-CM | POA: Diagnosis not present

## 2019-07-31 DIAGNOSIS — Y92009 Unspecified place in unspecified non-institutional (private) residence as the place of occurrence of the external cause: Secondary | ICD-10-CM | POA: Insufficient documentation

## 2019-07-31 DIAGNOSIS — Y999 Unspecified external cause status: Secondary | ICD-10-CM | POA: Insufficient documentation

## 2019-07-31 DIAGNOSIS — J449 Chronic obstructive pulmonary disease, unspecified: Secondary | ICD-10-CM | POA: Insufficient documentation

## 2019-07-31 DIAGNOSIS — I5032 Chronic diastolic (congestive) heart failure: Secondary | ICD-10-CM | POA: Insufficient documentation

## 2019-07-31 DIAGNOSIS — Z794 Long term (current) use of insulin: Secondary | ICD-10-CM | POA: Insufficient documentation

## 2019-07-31 DIAGNOSIS — Z9181 History of falling: Secondary | ICD-10-CM | POA: Insufficient documentation

## 2019-07-31 DIAGNOSIS — I2510 Atherosclerotic heart disease of native coronary artery without angina pectoris: Secondary | ICD-10-CM | POA: Insufficient documentation

## 2019-07-31 DIAGNOSIS — S8002XA Contusion of left knee, initial encounter: Secondary | ICD-10-CM | POA: Diagnosis not present

## 2019-07-31 DIAGNOSIS — Z87891 Personal history of nicotine dependence: Secondary | ICD-10-CM | POA: Insufficient documentation

## 2019-07-31 DIAGNOSIS — Z79899 Other long term (current) drug therapy: Secondary | ICD-10-CM | POA: Insufficient documentation

## 2019-07-31 DIAGNOSIS — S8992XA Unspecified injury of left lower leg, initial encounter: Secondary | ICD-10-CM | POA: Diagnosis present

## 2019-07-31 DIAGNOSIS — W231XXA Caught, crushed, jammed, or pinched between stationary objects, initial encounter: Secondary | ICD-10-CM | POA: Insufficient documentation

## 2019-07-31 DIAGNOSIS — Y939 Activity, unspecified: Secondary | ICD-10-CM | POA: Insufficient documentation

## 2019-07-31 DIAGNOSIS — E119 Type 2 diabetes mellitus without complications: Secondary | ICD-10-CM | POA: Insufficient documentation

## 2019-07-31 MED ORDER — TRAMADOL HCL 50 MG PO TABS
50.0000 mg | ORAL_TABLET | Freq: Once | ORAL | Status: AC
  Administered 2019-07-31: 50 mg via ORAL
  Filled 2019-07-31: qty 1

## 2019-07-31 MED ORDER — DOCUSATE SODIUM 100 MG PO CAPS
100.0000 mg | ORAL_CAPSULE | Freq: Once | ORAL | Status: DC
  Filled 2019-07-31: qty 1

## 2019-07-31 MED ORDER — TRAMADOL HCL 50 MG PO TABS: 50.0000 mg | ORAL_TABLET | Freq: Four times a day (QID) | ORAL | 0 refills | Status: DC | PRN

## 2019-07-31 NOTE — ED Notes (Signed)
Patient had a reported "healthy bowel movement" and refused colace.

## 2019-07-31 NOTE — ED Notes (Signed)
RN attempted to call wife again, as patient reports she gets up religiously at 0800. Call sent to voicemail.
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
## 2019-07-31 NOTE — ED Notes (Addendum)
Attempted to contact patient's wife with no answer, left HIPAA compliant voicemail for wife to call back.

## 2019-07-31 NOTE — ED Notes (Signed)
Patient's wife called [facility] and reports she will be here within the next hour. Patient's wife was rather frustrated, stating "He keeps causing so much trouble, going to the hospital for non-emergent things."

## 2019-07-31 NOTE — ED Triage Notes (Signed)
Pt has had a few falls and has been seen at [facility] yesterday for the same, he complains of knee pain from getting tangled in his oxygen tubing, theres no swelling or deformity EMS reports that prior to falling he got in to an argument with his spouse

## 2019-07-31 NOTE — ED Notes (Signed)
Patient has continued to berate staff. RN asked what she could do to make him happy and he stated "I am not looking for happiness. I am about to pass out." RN stated "Nothing I have done has made you happy." Pateint stated "Well give me a bed" RN explained that patient was up for discharge and there were no available beds, that we are waiting on his wife to pick him up.  Patient stated "Well you can't be married. You know what I like about you? Not a Goddamn thing"

## 2019-07-31 NOTE — ED Notes (Signed)
Patient rude to staff and arguing. RN attempted to explain d/c paperwork

## 2019-08-04 ENCOUNTER — Encounter: Payer: Self-pay | Admitting: Emergency Medicine

## 2019-08-04 ENCOUNTER — Emergency Department: Payer: Medicare HMO

## 2019-08-04 ENCOUNTER — Other Ambulatory Visit: Payer: Self-pay

## 2019-08-04 ENCOUNTER — Observation Stay
Admission: EM | Admit: 2019-08-04 | Discharge: 2019-08-07 | Disposition: A | Discharge status: Home / Self Care | Payer: Medicare HMO | Attending: Internal Medicine | Admitting: Internal Medicine

## 2019-08-04 DIAGNOSIS — E785 Hyperlipidemia, unspecified: Secondary | ICD-10-CM | POA: Insufficient documentation

## 2019-08-04 DIAGNOSIS — N183 Chronic kidney disease, stage 3 unspecified: Secondary | ICD-10-CM | POA: Insufficient documentation

## 2019-08-04 DIAGNOSIS — Z880 Allergy status to penicillin: Secondary | ICD-10-CM | POA: Insufficient documentation

## 2019-08-04 DIAGNOSIS — E871 Hypo-osmolality and hyponatremia: Secondary | ICD-10-CM

## 2019-08-04 DIAGNOSIS — D631 Anemia in chronic kidney disease: Secondary | ICD-10-CM | POA: Diagnosis not present

## 2019-08-04 DIAGNOSIS — J449 Chronic obstructive pulmonary disease, unspecified: Secondary | ICD-10-CM

## 2019-08-04 DIAGNOSIS — N179 Acute kidney failure, unspecified: Secondary | ICD-10-CM | POA: Diagnosis not present

## 2019-08-04 DIAGNOSIS — K219 Gastro-esophageal reflux disease without esophagitis: Secondary | ICD-10-CM | POA: Diagnosis not present

## 2019-08-04 DIAGNOSIS — I252 Old myocardial infarction: Secondary | ICD-10-CM | POA: Insufficient documentation

## 2019-08-04 DIAGNOSIS — Z836 Family history of other diseases of the respiratory system: Secondary | ICD-10-CM | POA: Insufficient documentation

## 2019-08-04 DIAGNOSIS — F3162 Bipolar disorder, current episode mixed, moderate: Secondary | ICD-10-CM | POA: Diagnosis not present

## 2019-08-04 DIAGNOSIS — D509 Iron deficiency anemia, unspecified: Secondary | ICD-10-CM | POA: Diagnosis not present

## 2019-08-04 DIAGNOSIS — Z79899 Other long term (current) drug therapy: Secondary | ICD-10-CM | POA: Insufficient documentation

## 2019-08-04 DIAGNOSIS — R079 Chest pain, unspecified: Secondary | ICD-10-CM | POA: Diagnosis present

## 2019-08-04 DIAGNOSIS — J439 Emphysema, unspecified: Secondary | ICD-10-CM | POA: Diagnosis not present

## 2019-08-04 DIAGNOSIS — Z20828 Contact with and (suspected) exposure to other viral communicable diseases: Secondary | ICD-10-CM | POA: Diagnosis not present

## 2019-08-04 DIAGNOSIS — Z881 Allergy status to other antibiotic agents status: Secondary | ICD-10-CM | POA: Insufficient documentation

## 2019-08-04 DIAGNOSIS — E1122 Type 2 diabetes mellitus with diabetic chronic kidney disease: Secondary | ICD-10-CM | POA: Insufficient documentation

## 2019-08-04 DIAGNOSIS — F419 Anxiety disorder, unspecified: Secondary | ICD-10-CM | POA: Diagnosis not present

## 2019-08-04 DIAGNOSIS — I130 Hypertensive heart and chronic kidney disease with heart failure and stage 1 through stage 4 chronic kidney disease, or unspecified chronic kidney disease: Secondary | ICD-10-CM | POA: Diagnosis not present

## 2019-08-04 DIAGNOSIS — Z9181 History of falling: Secondary | ICD-10-CM | POA: Insufficient documentation

## 2019-08-04 DIAGNOSIS — I452 Bifascicular block: Secondary | ICD-10-CM | POA: Insufficient documentation

## 2019-08-04 DIAGNOSIS — Z794 Long term (current) use of insulin: Secondary | ICD-10-CM | POA: Insufficient documentation

## 2019-08-04 DIAGNOSIS — W19XXXA Unspecified fall, initial encounter: Secondary | ICD-10-CM | POA: Insufficient documentation

## 2019-08-04 DIAGNOSIS — I2510 Atherosclerotic heart disease of native coronary artery without angina pectoris: Secondary | ICD-10-CM | POA: Insufficient documentation

## 2019-08-04 DIAGNOSIS — Z6835 Body mass index (BMI) 35.0-35.9, adult: Secondary | ICD-10-CM | POA: Insufficient documentation

## 2019-08-04 DIAGNOSIS — Z885 Allergy status to narcotic agent status: Secondary | ICD-10-CM | POA: Insufficient documentation

## 2019-08-04 DIAGNOSIS — S83242A Other tear of medial meniscus, current injury, left knee, initial encounter: Secondary | ICD-10-CM | POA: Diagnosis not present

## 2019-08-04 DIAGNOSIS — R0789 Other chest pain: Secondary | ICD-10-CM | POA: Diagnosis not present

## 2019-08-04 DIAGNOSIS — Z0279 Encounter for issue of other medical certificate: Secondary | ICD-10-CM

## 2019-08-04 DIAGNOSIS — Z0189 Encounter for other specified special examinations: Secondary | ICD-10-CM

## 2019-08-04 DIAGNOSIS — J9611 Chronic respiratory failure with hypoxia: Secondary | ICD-10-CM | POA: Diagnosis not present

## 2019-08-04 DIAGNOSIS — R531 Weakness: Secondary | ICD-10-CM

## 2019-08-04 DIAGNOSIS — K922 Gastrointestinal hemorrhage, unspecified: Secondary | ICD-10-CM | POA: Diagnosis not present

## 2019-08-04 DIAGNOSIS — I509 Heart failure, unspecified: Secondary | ICD-10-CM

## 2019-08-04 DIAGNOSIS — Z9981 Dependence on supplemental oxygen: Secondary | ICD-10-CM | POA: Insufficient documentation

## 2019-08-04 DIAGNOSIS — Z951 Presence of aortocoronary bypass graft: Secondary | ICD-10-CM | POA: Insufficient documentation

## 2019-08-04 DIAGNOSIS — Z888 Allergy status to other drugs, medicaments and biological substances status: Secondary | ICD-10-CM | POA: Insufficient documentation

## 2019-08-04 DIAGNOSIS — I5032 Chronic diastolic (congestive) heart failure: Secondary | ICD-10-CM | POA: Diagnosis not present

## 2019-08-04 DIAGNOSIS — E875 Hyperkalemia: Secondary | ICD-10-CM | POA: Insufficient documentation

## 2019-08-04 DIAGNOSIS — Z8249 Family history of ischemic heart disease and other diseases of the circulatory system: Secondary | ICD-10-CM | POA: Insufficient documentation

## 2019-08-04 DIAGNOSIS — Z833 Family history of diabetes mellitus: Secondary | ICD-10-CM | POA: Insufficient documentation

## 2019-08-04 DIAGNOSIS — Z87891 Personal history of nicotine dependence: Secondary | ICD-10-CM | POA: Insufficient documentation

## 2019-08-04 LAB — CBC WITH DIFFERENTIAL/PLATELET
Abs Immature Granulocytes: 0.96 10*3/uL — ABNORMAL HIGH (ref 0.00–0.07)
Basophils Absolute: 0.1 10*3/uL (ref 0.0–0.1)
Basophils Relative: 1 %
Eosinophils Absolute: 0.4 10*3/uL (ref 0.0–0.5)
Eosinophils Relative: 3 %
HCT: 30.8 % — ABNORMAL LOW (ref 39.0–52.0)
Hemoglobin: 9.4 g/dL — ABNORMAL LOW (ref 13.0–17.0)
Immature Granulocytes: 7 %
Lymphocytes Relative: 7 %
Lymphs Abs: 1.1 10*3/uL (ref 0.7–4.0)
MCH: 26.3 pg (ref 26.0–34.0)
MCHC: 30.5 g/dL (ref 30.0–36.0)
MCV: 86 fL (ref 80.0–100.0)
Monocytes Absolute: 1.6 10*3/uL — ABNORMAL HIGH (ref 0.1–1.0)
Monocytes Relative: 11 %
Neutro Abs: 10.6 10*3/uL — ABNORMAL HIGH (ref 1.7–7.7)
Neutrophils Relative %: 71 %
Platelets: 239 10*3/uL (ref 150–400)
RBC: 3.58 MIL/uL — ABNORMAL LOW (ref 4.22–5.81)
RDW: 17.2 % — ABNORMAL HIGH (ref 11.5–15.5)
WBC: 14.3 10*3/uL — ABNORMAL HIGH (ref 4.0–10.5)
nRBC: 0.2 % (ref 0.0–0.2)

## 2019-08-04 LAB — GLUCOSE, CAPILLARY
Glucose-Capillary: 145 mg/dL — ABNORMAL HIGH (ref 70–99)
Glucose-Capillary: 172 mg/dL — ABNORMAL HIGH (ref 70–99)
Glucose-Capillary: 198 mg/dL — ABNORMAL HIGH (ref 70–99)

## 2019-08-04 LAB — BLOOD GAS, VENOUS
Acid-Base Excess: 5.5 mmol/L — ABNORMAL HIGH (ref 0.0–2.0)
Bicarbonate: 34.8 mmol/L — ABNORMAL HIGH (ref 20.0–28.0)
O2 Saturation: 62.3 %
Patient temperature: 37
pCO2, Ven: 74 mmHg (ref 44.0–60.0)
pH, Ven: 7.28 (ref 7.250–7.430)
pO2, Ven: 37 mmHg (ref 32.0–45.0)

## 2019-08-04 LAB — COMPREHENSIVE METABOLIC PANEL
ALT: 12 U/L (ref 0–44)
AST: 18 U/L (ref 15–41)
Albumin: 3.6 g/dL (ref 3.5–5.0)
Alkaline Phosphatase: 121 U/L (ref 38–126)
Anion gap: 13 (ref 5–15)
BUN: 31 mg/dL — ABNORMAL HIGH (ref 8–23)
CO2: 28 mmol/L (ref 22–32)
Calcium: 8.5 mg/dL — ABNORMAL LOW (ref 8.9–10.3)
Chloride: 86 mmol/L — ABNORMAL LOW (ref 98–111)
Creatinine, Ser: 1.77 mg/dL — ABNORMAL HIGH (ref 0.61–1.24)
GFR calc Af Amer: 42 mL/min — ABNORMAL LOW (ref >60)
GFR calc non Af Amer: 36 mL/min — ABNORMAL LOW (ref >60)
Glucose, Bld: 168 mg/dL — ABNORMAL HIGH (ref 70–99)
Potassium: 5.7 mmol/L — ABNORMAL HIGH (ref 3.5–5.1)
Sodium: 127 mmol/L — ABNORMAL LOW (ref 135–145)
Total Bilirubin: 0.7 mg/dL (ref 0.3–1.2)
Total Protein: 7.1 g/dL (ref 6.5–8.1)

## 2019-08-04 LAB — SARS CORONAVIRUS 2 (TAT 6-24 HRS): SARS Coronavirus 2: NEGATIVE

## 2019-08-04 LAB — TROPONIN I (HIGH SENSITIVITY)
Troponin I (High Sensitivity): 11 ng/L (ref <18)
Troponin I (High Sensitivity): 11 ng/L (ref <18)
Troponin I (High Sensitivity): 11 ng/L (ref <18)
Troponin I (High Sensitivity): 10 ng/L (ref <18)

## 2019-08-04 LAB — TSH: TSH: 2.947 u[IU]/mL (ref 0.350–4.500)

## 2019-08-04 MED ORDER — INSULIN GLARGINE 100 UNIT/ML ~~LOC~~ SOLN
20.0000 [IU] | Freq: Every day | SUBCUTANEOUS | Status: DC
  Administered 2019-08-04 – 2019-08-07 (×4): 20 [IU] via SUBCUTANEOUS
  Filled 2019-08-04 (×5): qty 0.2

## 2019-08-04 MED ORDER — ONDANSETRON HCL 4 MG PO TABS: 4.0000 mg | ORAL_TABLET | Freq: Four times a day (QID) | ORAL | Status: DC | PRN

## 2019-08-04 MED ORDER — ONDANSETRON HCL 4 MG/2ML IJ SOLN
4.0000 mg | Freq: Four times a day (QID) | INTRAMUSCULAR | Status: DC | PRN
  Administered 2019-08-06 – 2019-08-07 (×3): 4 mg via INTRAVENOUS
  Filled 2019-08-04 (×3): qty 2

## 2019-08-04 MED ORDER — INSULIN ASPART 100 UNIT/ML ~~LOC~~ SOLN: 0.0000 [IU] | Freq: Every day | SUBCUTANEOUS | Status: DC

## 2019-08-04 MED ORDER — LABETALOL HCL 5 MG/ML IV SOLN: 10.0000 mg | Freq: Four times a day (QID) | INTRAVENOUS | Status: DC | PRN

## 2019-08-04 MED ORDER — ACETAMINOPHEN 650 MG RE SUPP: 650.0000 mg | Freq: Four times a day (QID) | RECTAL | Status: DC | PRN

## 2019-08-04 MED ORDER — SODIUM CHLORIDE 0.9 % IV SOLN
INTRAVENOUS | Status: DC
  Administered 2019-08-04 (×2): via INTRAVENOUS

## 2019-08-04 MED ORDER — ENOXAPARIN SODIUM 40 MG/0.4ML ~~LOC~~ SOLN
40.0000 mg | SUBCUTANEOUS | Status: DC
  Administered 2019-08-04 – 2019-08-06 (×3): 40 mg via SUBCUTANEOUS
  Filled 2019-08-04 (×3): qty 0.4

## 2019-08-04 MED ORDER — ACETAMINOPHEN 325 MG PO TABS
650.0000 mg | ORAL_TABLET | Freq: Four times a day (QID) | ORAL | Status: DC | PRN
  Administered 2019-08-06: 650 mg via ORAL
  Filled 2019-08-04: qty 2

## 2019-08-04 MED ORDER — FUROSEMIDE 10 MG/ML IJ SOLN
40.0000 mg | Freq: Once | INTRAMUSCULAR | Status: AC
  Administered 2019-08-04: 40 mg via INTRAVENOUS
  Filled 2019-08-04: qty 4

## 2019-08-04 MED ORDER — POLYETHYLENE GLYCOL 3350 17 G PO PACK
17.0000 g | PACK | Freq: Every day | ORAL | Status: DC | PRN
  Administered 2019-08-05: 17 g via ORAL
  Filled 2019-08-04 (×2): qty 1

## 2019-08-04 MED ORDER — INSULIN ASPART 100 UNIT/ML ~~LOC~~ SOLN
0.0000 [IU] | Freq: Three times a day (TID) | SUBCUTANEOUS | Status: DC
  Administered 2019-08-04 – 2019-08-05 (×2): 3 [IU] via SUBCUTANEOUS
  Administered 2019-08-05: 5 [IU] via SUBCUTANEOUS
  Administered 2019-08-05 – 2019-08-06 (×2): 3 [IU] via SUBCUTANEOUS
  Administered 2019-08-07: 5 [IU] via SUBCUTANEOUS
  Administered 2019-08-07: 3 [IU] via SUBCUTANEOUS
  Administered 2019-08-07: 8 [IU] via SUBCUTANEOUS
  Filled 2019-08-04 (×9): qty 1

## 2019-08-04 MED ORDER — HYDROCODONE-ACETAMINOPHEN 5-325 MG PO TABS
1.0000 | ORAL_TABLET | ORAL | Status: DC | PRN
  Administered 2019-08-04 – 2019-08-07 (×10): 2 via ORAL
  Filled 2019-08-04 (×11): qty 2

## 2019-08-04 MED ORDER — PATIROMER SORBITEX CALCIUM 8.4 G PO PACK
8.4000 g | PACK | Freq: Every day | ORAL | Status: DC
  Administered 2019-08-04 – 2019-08-07 (×4): 8.4 g via ORAL
  Filled 2019-08-04 (×5): qty 1

## 2019-08-04 MED ORDER — IPRATROPIUM-ALBUTEROL 0.5-2.5 (3) MG/3ML IN SOLN
3.0000 mL | RESPIRATORY_TRACT | Status: DC
  Administered 2019-08-04 – 2019-08-06 (×15): 3 mL via RESPIRATORY_TRACT
  Filled 2019-08-04 (×15): qty 3

## 2019-08-04 NOTE — Progress Notes (Signed)
   08/04/19 1800  Clinical Encounter Type  Visited With Patient  Visit Type Initial  Referral From Physician  Ch received an OR for AD education. Pt said that his wife is his HCPOA and that he is not interested in completing a document. Pt did not want the document either.

## 2019-08-04 NOTE — ED Notes (Signed)
Report completed with Darlene RN for 259

## 2019-08-04 NOTE — ED Notes (Addendum)
Report received from [facility], pt moved to room 31, adjusted into hospital bed and oriented to his call bell, pt asking for a remote, a phone, and the lights to be turned down. No distress noted at this time, cont to monitor

## 2019-08-04 NOTE — ED Notes (Signed)
Pt cont to rest with his eyes closed, no distress noted at this time

## 2019-08-04 NOTE — ED Notes (Signed)
Pt resting in bed with eyes closed, NAD, will continue to monitor

## 2019-08-04 NOTE — ED Notes (Signed)
Pt resting quietly at this time, eyes closed, no distress noted, cont to monitor

## 2019-08-04 NOTE — ED Notes (Signed)
Pt easily awakened, using his condom cath well, states "I had to pee" pt told that it is working well for him, no distress noted cont to monitor

## 2019-08-04 NOTE — Progress Notes (Signed)
Family Meeting Note  Advance Directive:no  Today a meeting took place with the Patient.   The following clinical team members were present during this meeting:MD  The following were discussed:Patient's diagnosis: hyponatremia Hyperkalemia Diabetes DHF , Patient's progosis: Unable to determine and Goals for treatment: Full Code  Additional follow-up to be provided: Chaplain consultation to create advanced directives Outpatient palliative services may be of benefit  Time spent during discussion: 16 minutes  Bettey Costa, MD

## 2019-08-04 NOTE — ED Triage Notes (Signed)
Patient presents to Emergency Department via [facility] from HOME with complaints of CP.  Left side sharp no radiation  Pt is poor historian and falling asleep during triage questioning  Per [facility] pt "calls out often and varies between uncooperative and openly belligerent so the sheriff has to come with [place] on our calls to his house.  There was feces everywhere.  I couldn't tell if it was his [the pt] or the dog's."    History of DM, CHF

## 2019-08-04 NOTE — ED Notes (Signed)
Pt incontinent with large soft stool. While cleaning pt it was noted that he had old dry stool on bottoms of feet, numerous places on legs, groin area, and both hands. There areas were also cleaned.
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
## 2019-08-05 ENCOUNTER — Other Ambulatory Visit: Payer: Medicare HMO

## 2019-08-05 DIAGNOSIS — I5032 Chronic diastolic (congestive) heart failure: Secondary | ICD-10-CM | POA: Diagnosis not present

## 2019-08-05 DIAGNOSIS — R0789 Other chest pain: Secondary | ICD-10-CM | POA: Diagnosis not present

## 2019-08-05 DIAGNOSIS — I2510 Atherosclerotic heart disease of native coronary artery without angina pectoris: Secondary | ICD-10-CM

## 2019-08-05 DIAGNOSIS — J449 Chronic obstructive pulmonary disease, unspecified: Secondary | ICD-10-CM

## 2019-08-05 LAB — GLUCOSE, CAPILLARY
Glucose-Capillary: 155 mg/dL — ABNORMAL HIGH (ref 70–99)
Glucose-Capillary: 203 mg/dL — ABNORMAL HIGH (ref 70–99)
Glucose-Capillary: 191 mg/dL — ABNORMAL HIGH (ref 70–99)
Glucose-Capillary: 163 mg/dL — ABNORMAL HIGH (ref 70–99)

## 2019-08-05 LAB — CBC
HCT: 33 % — ABNORMAL LOW (ref 39.0–52.0)
Hemoglobin: 9.9 g/dL — ABNORMAL LOW (ref 13.0–17.0)
MCH: 26.3 pg (ref 26.0–34.0)
MCHC: 30 g/dL (ref 30.0–36.0)
MCV: 87.5 fL (ref 80.0–100.0)
Platelets: 218 10*3/uL (ref 150–400)
RBC: 3.77 MIL/uL — ABNORMAL LOW (ref 4.22–5.81)
RDW: 17.2 % — ABNORMAL HIGH (ref 11.5–15.5)
WBC: 5.7 10*3/uL (ref 4.0–10.5)
nRBC: 0 % (ref 0.0–0.2)

## 2019-08-05 LAB — BASIC METABOLIC PANEL
Anion gap: 10 (ref 5–15)
BUN: 26 mg/dL — ABNORMAL HIGH (ref 8–23)
CO2: 30 mmol/L (ref 22–32)
Calcium: 8.5 mg/dL — ABNORMAL LOW (ref 8.9–10.3)
Chloride: 91 mmol/L — ABNORMAL LOW (ref 98–111)
Creatinine, Ser: 1.32 mg/dL — ABNORMAL HIGH (ref 0.61–1.24)
GFR calc Af Amer: 60 mL/min — ABNORMAL LOW (ref >60)
GFR calc non Af Amer: 52 mL/min — ABNORMAL LOW (ref >60)
Glucose, Bld: 147 mg/dL — ABNORMAL HIGH (ref 70–99)
Potassium: 4.4 mmol/L (ref 3.5–5.1)
Sodium: 131 mmol/L — ABNORMAL LOW (ref 135–145)

## 2019-08-05 LAB — HEPATIC FUNCTION PANEL
ALT: 10 U/L (ref 0–44)
AST: 14 U/L — ABNORMAL LOW (ref 15–41)
Albumin: 3.5 g/dL (ref 3.5–5.0)
Alkaline Phosphatase: 133 U/L — ABNORMAL HIGH (ref 38–126)
Bilirubin, Direct: 0.1 mg/dL (ref 0.0–0.2)
Indirect Bilirubin: 0.3 mg/dL (ref 0.3–0.9)
Total Bilirubin: 0.4 mg/dL (ref 0.3–1.2)
Total Protein: 6.9 g/dL (ref 6.5–8.1)

## 2019-08-05 LAB — LIPID PANEL
Cholesterol: 205 mg/dL — ABNORMAL HIGH (ref 0–200)
HDL: 34 mg/dL — ABNORMAL LOW (ref >40)
LDL Cholesterol: 119 mg/dL — ABNORMAL HIGH (ref 0–99)
Total CHOL/HDL Ratio: 6 RATIO
Triglycerides: 261 mg/dL — ABNORMAL HIGH (ref <150)
VLDL: 52 mg/dL — ABNORMAL HIGH (ref 0–40)

## 2019-08-05 LAB — BRAIN NATRIURETIC PEPTIDE: B Natriuretic Peptide: 93 pg/mL (ref 0.0–100.0)

## 2019-08-05 LAB — CORTISOL: Cortisol, Plasma: 5.8 ug/dL

## 2019-08-05 MED ORDER — ALPRAZOLAM 0.25 MG PO TABS
0.2500 mg | ORAL_TABLET | Freq: Three times a day (TID) | ORAL | Status: DC | PRN
  Administered 2019-08-06 – 2019-08-07 (×3): 0.25 mg via ORAL
  Filled 2019-08-05 (×3): qty 1

## 2019-08-05 MED ORDER — PANTOPRAZOLE SODIUM 40 MG PO TBEC
40.0000 mg | DELAYED_RELEASE_TABLET | Freq: Two times a day (BID) | ORAL | Status: DC
  Administered 2019-08-05 – 2019-08-07 (×5): 40 mg via ORAL
  Filled 2019-08-05 (×5): qty 1

## 2019-08-05 MED ORDER — BENZONATATE 100 MG PO CAPS
100.0000 mg | ORAL_CAPSULE | Freq: Three times a day (TID) | ORAL | Status: DC | PRN
  Administered 2019-08-07: 100 mg via ORAL
  Filled 2019-08-05: qty 1

## 2019-08-05 MED ORDER — GABAPENTIN 400 MG PO CAPS
400.0000 mg | ORAL_CAPSULE | Freq: Four times a day (QID) | ORAL | Status: DC
  Administered 2019-08-05 – 2019-08-07 (×10): 400 mg via ORAL
  Filled 2019-08-05 (×10): qty 1

## 2019-08-05 MED ORDER — CITALOPRAM HYDROBROMIDE 20 MG PO TABS
20.0000 mg | ORAL_TABLET | Freq: Every day | ORAL | Status: DC
  Administered 2019-08-05 – 2019-08-07 (×3): 20 mg via ORAL
  Filled 2019-08-05 (×3): qty 1

## 2019-08-05 MED ORDER — ATORVASTATIN CALCIUM 20 MG PO TABS
40.0000 mg | ORAL_TABLET | Freq: Every day | ORAL | Status: DC
  Administered 2019-08-05 – 2019-08-07 (×3): 40 mg via ORAL
  Filled 2019-08-05 (×4): qty 2

## 2019-08-05 MED ORDER — FLUTICASONE PROPIONATE 50 MCG/ACT NA SUSP
2.0000 | Freq: Every day | NASAL | Status: DC | PRN
  Filled 2019-08-05: qty 16

## 2019-08-05 NOTE — Progress Notes (Signed)
PT Cancellation Note  Patient Details Name: [name] MRN: [id number] DOB: Jul 25, 1941   Cancelled Treatment:    Reason Eval/Treat Not Completed: Medical issues which prohibited therapy Pt is contraindicated today due to high potassium (5.7). PT will re-attempt tomorrow.  Alon Mazor, SPT   [name] 08/05/2019, 9:22 AM

## 2019-08-05 NOTE — Progress Notes (Addendum)
Nutrition Brief Note  Patient identified on the Malnutrition Screening Tool (MST) Report  Wt Readings from Last 15 Encounters:  08/05/19 128.9 kg  07/08/19 123.3 kg  06/21/19 131.1 kg  05/05/19 127 kg  04/28/19 127.1 kg  04/06/19 126.6 kg  03/30/19 126.6 kg  03/26/19 118.8 kg  03/05/19 124.4 kg  12/26/18 129.2 kg  10/14/18 119.1 kg  10/06/18 123.3 kg  02/08/18 116.1 kg  01/28/18 117.3 kg  01/20/18 116.3 kg    Met with patient at bedside. He reports his appetite is good and unchanged from baseline. He is eating 100% of his meals here. He reports he does not weigh himself at home and he is only weighed when he comes into the hospital. Weight appears fairly stable per chart (fluctuations likely related to fluid status). He initially reported he was familiar with low-sodium and carbohydrate-modified diets and has received education. However, at end of assessment when RD was assessing for any education needs patient became very upset and disrespectful. He reported he would be interested in diet education regarding diabetes but that he does not want it now since he was eating dinner. RD offered to provide education another day. Patient became upset and began yelling at RD so RD exited room. Patient does not meet criteria for malnutrition at this time.  Body mass index is 35.52 kg/m. Patient meets criteria for obesity class II based on current BMI.   Current diet order is heart healthy/carbohydrate modified, patient is consuming approximately 100% of meals at this time. Labs and medications reviewed.   No nutrition interventions warranted at this time. If nutrition issues arise, please consult RD.   Willey Blade, MS, [place], LDN Office: [phone] Pager: [phone] After Hours/Weekend Pager: [phone]

## 2019-08-05 NOTE — NC FL2 (Signed)
[place] LEVEL OF CARE SCREENING TOOL     IDENTIFICATION  Patient Name: [name] Birthdate: 08/11/1941 Sex: male Admission Date (Current Location): 08/04/2019  [place] and [place] Number:  Engineering geologist and Address:  [facility], [address], [place], [place] [address]      Provider Number: [id number]  Attending Physician Name and Address:  Sela Hua, MD  Relative Name and Phone Number:  Avrohom, Mcelveen   [phone]    Current Level of Care: Hospital Recommended Level of Care: [place] Prior Approval Number:    Date Approved/Denied:   PASRR Number: [id number]  Discharge Plan: SNF    Current Diagnoses: Patient Active Problem List   Diagnosis Date Noted  . Chest pain 08/04/2019  . Acute respiratory failure with hypoxia and hypercapnia ([place]) 07/06/2019  . Acute febrile illness   . Hyperglycemia   . Weakness 05/05/2019  . Febrile illness 04/27/2019  . Breathlessness   . Goals of care, counseling/discussion   . Palliative care by specialist   . Encounter for hospice care discussion   . COPD with acute exacerbation ([place]) 04/06/2019  . COPD exacerbation ([place]) 02/28/2019  . Community acquired pneumonia of left lower lobe of lung   . Hypoxia 12/26/2018  . Acute on chronic respiratory failure with hypoxia ([place])   . Acute on chronic diastolic CHF (congestive heart failure) ([place]) 10/02/2018  . Acute respiratory failure ([place]) 10/02/2018  . Acute Cameron ulcer   . Acute GI bleeding 02/08/2018  . Anemia due to chronic kidney disease   . AKI (acute kidney injury) ([place]) 05/08/2017  . Diabetes mellitus with complication ([place]) [id number]  . Melena   . Benign neoplasm of sigmoid colon   . Benign neoplasm of descending colon   . AVM (arteriovenous malformation) of colon with hemorrhage   . GI bleed 04/05/2017  . Intermittent left lower quadrant abdominal pain   . Generalized weakness  11/09/2016  . Symptomatic anemia 10/21/2016  . Low back pain 07/02/2015  . Gastric AVM   . AVM (arteriovenous malformation) of duodenum, acquired with hemorrhage   . Bipolar 1 disorder, mixed, moderate ([place]) 04/16/2015  . Major depressive disorder, recurrent, severe without psychotic features ([place])   . Supplemental oxygen dependent 11/30/2014  . Iron deficiency anemia due to chronic blood loss 11/25/2014  . Vitamin D deficiency 08/10/2014  . Insomnia 08/10/2014  . Polypharmacy 08/10/2014  . Chronic pain syndrome 08/10/2014  . 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 Abdominal pain 12/04/2013  . Aftercare following surgery of the circulatory system, [place] 11/04/2013  . Acute blood loss anemia 10/16/2013  . Multiple gastric polyps 09/10/2013  . Anxiety state 09/10/2013  . 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 Chronic hypoxemic respiratory failure ([place]) 05/21/2013  . Acute on chronic respiratory failure with hypoxia and hypercapnia ([place]) 12/04/2012  . Chronic GI bleeding 05/17/2012  . CAD (coronary artery disease) 04/17/2012  . Chronic diastolic CHF (congestive heart failure) ([place]) 03/27/2012  . COPD (chronic obstructive pulmonary disease) ([place]) 09/13/2011  . CERUMEN IMPACTION, BILATERAL 11/11/2010  . Hyperlipidemia 08/18/2009  . Essential hypertension 08/18/2009  . ALLERGIC RHINITIS 08/18/2009  . AAA (abdominal aortic aneurysm) ([place]) 12/15/2008    Orientation RESPIRATION BLADDER Height & Weight     Self, Time, Situation, Place  O2(5L) Incontinent Weight: 284 lb 2.8 oz (128.9 kg) Height:  6\' 3"  (190.5 cm)  BEHAVIORAL SYMPTOMS/MOOD NEUROLOGICAL BOWEL NUTRITION STATUS  Continent Diet(Carb Modified)  AMBULATORY STATUS COMMUNICATION OF NEEDS Skin   Limited Assist Verbally Normal                       Personal Care Assistance Level of Assistance  Bathing, Feeding, Dressing Bathing Assistance:  Limited assistance Feeding assistance: Independent Dressing Assistance: Limited assistance     Functional Limitations Info  Sight, Speech, Hearing Sight Info: Adequate Hearing Info: Adequate Speech Info: Adequate    SPECIAL CARE FACTORS FREQUENCY  PT (By licensed PT), OT (By licensed OT)     PT Frequency: Minimum 5x a week OT Frequency: Minimum 5x a week            Contractures Contractures Info: Not present    Additional Factors Info  Code Status, Psychotropic, Allergies, Insulin Sliding Scale Code Status Info: Full Code Allergies Info: Morphine And Related Penicillins Zolpidem, Demerol, Dilaudid, Levofloxacin Psychotropic Info: citalopram (CELEXA) tablet 20 mg Insulin Sliding Scale Info: insulin aspart (novoLOG) injection 0-15 Units       Current Medications (08/05/2019):  This is the current hospital active medication list Current Facility-Administered Medications  Medication Dose Route Frequency Provider Last Rate Last Dose  . acetaminophen (TYLENOL) tablet 650 mg  650 mg Oral Q6H PRN Bettey Costa, MD       Or  . acetaminophen (TYLENOL) suppository 650 mg  650 mg Rectal Q6H PRN Bettey Costa, MD      . ALPRAZolam Duanne Moron) tablet 0.25 mg  0.25 mg Oral TID PRN Mayo, Pete Pelt, MD      . atorvastatin (LIPITOR) tablet 40 mg  40 mg Oral q1800 Visser, Jacquelyn D, PA-C      . benzonatate (TESSALON) capsule 100 mg  100 mg Oral TID PRN Mayo, Pete Pelt, MD      . citalopram (CELEXA) tablet 20 mg  20 mg Oral Daily Mayo, Pete Pelt, MD      . enoxaparin (LOVENOX) injection 40 mg  40 mg Subcutaneous Q24H Bettey Costa, MD   40 mg at 08/04/19 2025  . fluticasone (FLONASE) 50 MCG/ACT nasal spray 2 spray  2 spray Each Nare Daily PRN Mayo, Pete Pelt, MD      . gabapentin (NEURONTIN) capsule 400 mg  400 mg Oral QID Mayo, Pete Pelt, MD      . HYDROcodone-acetaminophen (NORCO/VICODIN) 5-325 MG per tablet 1-2 tablet  1-2 tablet Oral Q4H PRN Bettey Costa, MD   2 tablet at 08/05/19 1216  .  insulin aspart (novoLOG) injection 0-15 Units  0-15 Units Subcutaneous TID WC Bettey Costa, MD   5 Units at 08/05/19 1244  . insulin aspart (novoLOG) injection 0-5 Units  0-5 Units Subcutaneous QHS Mody, Sital, MD      . insulin glargine (LANTUS) injection 20 Units  20 Units Subcutaneous Daily Bettey Costa, MD   20 Units at 08/05/19 1000  . ipratropium-albuterol (DUONEB) 0.5-2.5 (3) MG/3ML nebulizer solution 3 mL  3 mL Nebulization Q4H Mody, Sital, MD   3 mL at 08/05/19 1140  . labetalol (NORMODYNE) injection 10 mg  10 mg Intravenous Q6H PRN Mody, Sital, MD      . ondansetron (ZOFRAN) tablet 4 mg  4 mg Oral Q6H PRN Mody, Sital, MD       Or  . ondansetron (ZOFRAN) injection 4 mg  4 mg Intravenous Q6H PRN Mody, Sital, MD      . pantoprazole (PROTONIX) EC tablet 40 mg  40 mg Oral BID Mayo, Pete Pelt, MD      .  patiromer Daryll Drown) packet 8.4 g  8.4 g Oral Daily Mody, Sital, MD   8.4 g at 08/05/19 1126  . polyethylene glycol (MIRALAX / GLYCOLAX) packet 17 g  17 g Oral Daily PRN Bettey Costa, MD         Discharge Medications: Please see discharge summary for a list of discharge medications.  Relevant Imaging Results:  Relevant Lab Results:   Additional Information SSN [id number]  Ross Ludwig, LCSW

## 2019-08-05 NOTE — Plan of Care (Signed)
  Problem: Clinical Measurements: Goal: Ability to maintain clinical measurements within normal limits will improve Outcome: Progressing Goal: Will remain free from infection Outcome: Progressing Goal: Diagnostic test results will improve Outcome: Progressing Goal: Respiratory complications will improve Outcome: Progressing Goal: Cardiovascular complication will be avoided Outcome: Progressing   Problem: Clinical Measurements: Goal: Will remain free from infection Outcome: Progressing   Problem: Clinical Measurements: Goal: Respiratory complications will improve Outcome: Progressing

## 2019-08-05 NOTE — TOC Initial Note (Addendum)
Transition of Care [facility]) - Initial/Assessment Note    Patient Details  Name: [name] MRN: [id number] Date of Birth: 28-May-1941  Transition of [facility]) CM/SW Contact:    Ross Ludwig, LCSW Phone Number: 08/05/2019, 5:50 PM  Clinical Narrative:                  Patient is a 78 year old male who is alert and oriented x4.  Patient did not want to speak to CSW today, however CSW did speak to patient's wife.  Patient's wife states that she feels he needs to go to SNF before he is able to return back home.  Per PT, he does not want to go to SNF, CSW will check with patient again tomorrow.  CSW informed patient's wife, that the hospital can not force a patient to go to SNF if they have capacity to make their own decisions.  Patient's wife then stated that if he does not go to SNF, I will refuse to pick patient up.  CSW informed her that if patient is not agreeable to SNF, CSW can not force him to go.  Patient's wife then stated again, that she will refuse to pick him up.  CSW informed her that if she refuses to pick patient up, CSW will have to contact APS for abandonment.  Patient's wife then stated any other time people can be forced to go to SNF, and then CSW reiterated, patients can not be forced to go to SNF.   CSW informed her that if psych says a patient does not have capacity and unable to make decisions, then a patient can be sent to a psych hospital if they need it but not a SNF.  CSW explained to patient's wife that if patient has capacity, he is allowed to make his own decisions.  CSW informed her again that if patient is able to make his own decision he has the right to make poor decisions. CSW told her home health can be set up and equipment that he may need can be arranged but if he still refuses SNF, we can not force him to go.  CSW told her there are care agencies that you can pay for to help in the home as well.  CSW informed her that she can try to talk him into going SNF.  Patient's wife stated that he has always said he does not want to go to a SNF.  CSW informed her that home health can be set up if patient does not go to SNF.   Patient's wife was interersted in possibly having him stay at SNF long term if one is available.  CSW explained the process for looking for SNF, patient's wife expressed understanding and gave CSW permission to begin bed search.  CSW told her that if he does agree, CSW would have to get insurance approval for a SNF, CSW told her that if he does not get approved for SNF she would have to pay privately or take him home.  CSW told her that if people go to SNF and end up having to stay long term most people apply for Medicaid by contacting DSS.  Patient's wife stated that she can not afford to pay privately for SNF.   Expected Discharge Plan: Skilled Nursing Facility Barriers to Discharge: Continued Medical Work up, Ship broker   Patient Goals and CMS Choice Patient states their goals for this hospitalization and ongoing recovery are:: To return back home,  even though his wife wants him to go to SNF. CMS Medicare.gov Compare Post Acute Care list provided to:: Patient Choice offered to / list presented to : Spouse  Expected Discharge Plan and Services Expected Discharge Plan: [place]                                              Prior Living Arrangements/Services   Lives with:: Spouse Patient language and need for interpreter reviewed:: Yes Do you feel safe going back to the place where you live?: Yes      Need for Family Participation in Patient Care: Yes (Comment) Care giver support system in place?: No (comment) Current home services: DME Criminal Activity/Legal Involvement Pertinent to Current Situation/Hospitalization: No - Comment as needed  Activities of Daily Living Home Assistive Devices/Equipment: Bedside commode/3-in-1, CPAP, Oxygen, Blood pressure cuff, CBG Meter, Nebulizer, Scales,  Walker (specify type) ADL Screening (condition at time of admission) Patient's cognitive ability adequate to safely complete daily activities?: Yes Is the patient deaf or have difficulty hearing?: No Does the patient have difficulty seeing, even when wearing glasses/contacts?: Yes Does the patient have difficulty concentrating, remembering, or making decisions?: No Patient able to express need for assistance with ADLs?: Yes Does the patient have difficulty dressing or bathing?: Yes Independently performs ADLs?: No Communication: Independent Is this a change from baseline?: Pre-admission baseline Dressing (OT): Needs assistance Is this a change from baseline?: Pre-admission baseline Grooming: Independent Feeding: Independent Bathing: Needs assistance Is this a change from baseline?: Pre-admission baseline Toileting: Independent with device (comment) Is this a change from baseline?: Pre-admission baseline In/Out Bed: Independent with device (comment) Walks in Home: Independent with device (comment) Does the patient have difficulty walking or climbing stairs?: Yes Weakness of Legs: Both Weakness of Arms/Hands: Both  Permission Sought/Granted Permission sought to share information with : Facility Sport and exercise psychologist, Family Supports Permission granted to share information with : Yes, Verbal Permission Granted  Share Information with NAME: Zian, [place]   [phone]  Permission granted to share info w AGENCY: SNF admissions        Emotional Assessment Appearance:: Appears stated age   Affect (typically observed): Agitated, Frustrated Orientation: : Oriented to Self, Oriented to Place, Oriented to  Time, Oriented to Situation Alcohol / Substance Use: Not Applicable Psych Involvement: Yes (comment)  Admission diagnosis:  Hyponatremia [E87.1] Generalized weakness [R53.1] Nonspecific chest pain [R07.9] Chronic obstructive pulmonary disease, unspecified COPD type  ([place]) [J44.9] Congestive heart failure, unspecified HF chronicity, unspecified heart failure type (Home) [I50.9] Patient Active Problem List   Diagnosis Date Noted  . Chest pain 08/04/2019  . Acute respiratory failure with hypoxia and hypercapnia ([place]) 07/06/2019  . Acute febrile illness   . Hyperglycemia   . Weakness 05/05/2019  . Febrile illness 04/27/2019  . Breathlessness   . Goals of care, counseling/discussion   . Palliative care by specialist   . Encounter for hospice care discussion   . COPD with acute exacerbation ([place]) 04/06/2019  . COPD exacerbation ([place]) 02/28/2019  . Community acquired pneumonia of left lower lobe of lung   . Hypoxia 12/26/2018  . Acute on chronic respiratory failure with hypoxia ([place])   . Acute on chronic diastolic CHF (congestive heart failure) ([place]) 10/02/2018  . Acute respiratory failure ([place]) 10/02/2018  . Acute Cameron ulcer   . Acute GI bleeding 02/08/2018  . Anemia due  to chronic kidney disease   . AKI (acute kidney injury) ([place]) 05/08/2017  . Diabetes mellitus with complication ([place]) [id number]  . Melena   . Benign neoplasm of sigmoid colon   . Benign neoplasm of descending colon   . AVM (arteriovenous malformation) of colon with hemorrhage   . GI bleed 04/05/2017  . Intermittent left lower quadrant abdominal pain   . Generalized weakness 11/09/2016  . Symptomatic anemia 10/21/2016  . Low back pain 07/02/2015  . Gastric AVM   . AVM (arteriovenous malformation) of duodenum, acquired with hemorrhage   . Bipolar 1 disorder, mixed, moderate ([place]) 04/16/2015  . Major depressive disorder, recurrent, severe without psychotic features ([place])   . Supplemental oxygen dependent 11/30/2014  . Iron deficiency anemia due to chronic blood loss 11/25/2014  . Vitamin D deficiency 08/10/2014  . Insomnia 08/10/2014  . Polypharmacy 08/10/2014  . Chronic pain syndrome 08/10/2014  . 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 Congenital  gastrointestinal vessel anomaly 12/07/2013  . Abdominal pain 12/04/2013  . Aftercare following surgery of the circulatory system, [place] 11/04/2013  . Acute blood loss anemia 10/16/2013  . Multiple gastric polyps 09/10/2013  . Anxiety state 09/10/2013  . 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 Chronic hypoxemic respiratory failure ([place]) 05/21/2013  . Acute on chronic respiratory failure with hypoxia and hypercapnia ([place]) 12/04/2012  . Chronic GI bleeding 05/17/2012  . CAD (coronary artery disease) 04/17/2012  . Chronic diastolic CHF (congestive heart failure) ([place]) 03/27/2012  . COPD (chronic obstructive pulmonary disease) ([place]) 09/13/2011  . CERUMEN IMPACTION, BILATERAL 11/11/2010  . Hyperlipidemia 08/18/2009  . Essential hypertension 08/18/2009  . ALLERGIC RHINITIS 08/18/2009  . AAA (abdominal aortic aneurysm) ([place]) 12/15/2008   PCP:  [name], MD Pharmacy:   [id number] [place], [address] [place] [address] Phone: [phone] Fax: [facility] [place], [place] [address] Phone: [phone] Fax: [phone]     Social Determinants of Health (SDOH) Interventions    Readmission Risk Interventions Readmission Risk Prevention Plan 07/08/2019 04/13/2019  Transportation Screening Complete Complete  Medication Review ([place]) Complete Complete  PCP or Specialist appointment within 3-5 days of discharge Complete Complete  HRI or Home Care Consult Complete Complete  SW Recovery Care/Counseling Consult Complete Complete  Palliative Care Screening Patient Refused Patient [place] Not Applicable Patient Refused  Some recent data might be hidden
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
## 2019-08-05 NOTE — Clinical Social Work Note (Signed)
CSW attempted to call patient's wife left a message on [phone].  Jones Broom. Etheleen Valtierra, MSW, LCSW [phone]  08/05/2019 10:11 AM

## 2019-08-05 NOTE — Progress Notes (Signed)
[facility] [place] NAME: [name]    MR#:  [id number]  DATE OF BIRTH:  [birth year]  SUBJECTIVE:   Patient states he is still feeling a little short of breath.  He had a stress test ordered for this morning, but he is adamantly refusing this.  He denies any chest pain.  REVIEW OF SYSTEMS:  Review of Systems  Constitutional: Negative for chills and fever.  HENT: Negative for congestion and sore throat.   Eyes: Negative for blurred vision and double vision.  Respiratory: Positive for shortness of breath. Negative for cough.   Cardiovascular: Negative for chest pain and palpitations.  Gastrointestinal: Negative for nausea and vomiting.  Genitourinary: Negative for dysuria and urgency.  Musculoskeletal: Negative for back pain and neck pain.  Neurological: Negative for dizziness and headaches.  Psychiatric/Behavioral: Negative for depression. The patient is not nervous/anxious.     DRUG ALLERGIES:   Allergies  Allergen Reactions  . Morphine And Related Shortness Of Breath, Nausea And Vomiting, Rash and Other (See Comments)    Reaction:  Hallucinations   . Penicillins Anaphylaxis, Hives and Other (See Comments)    10/02/18 - discussed with patient and he states he can tolerate penicillin capsules and had some hives when he was 78yo and received pcn injections  Has patient had a PCN reaction causing immediate rash, facial/tongue/throat swelling, SOB or lightheadedness with hypotension: Yes Has patient had a PCN reaction causing severe rash involving mucus membranes or skin necrosis: No Has patient had a PCN reaction that required hospitalization No Has patient had a PCN reaction occurring within the last 10 y  . Zolpidem Shortness Of Breath  . Demerol [Meperidine] Other (See Comments)    Reaction:  Hallucinations    . Dilaudid [Hydromorphone Hcl] Other (See Comments)    Reaction:  Hallucinations   . Levofloxacin Other (See  Comments)    Reaction:  Unknown    VITALS:  Blood pressure 114/62, pulse 91, temperature (!) 97.2 F (36.2 C), temperature source Oral, resp. rate 20, height 6\' 3"  (1.905 m), weight 128.9 kg, SpO2 99 %. PHYSICAL EXAMINATION:  Physical Exam  GENERAL:  Laying in the bed with no acute distress.  HEENT: Head atraumatic, normocephalic. Pupils equal, round, reactive to light and accommodation. No scleral icterus. Extraocular muscles intact. Oropharynx and nasopharynx clear.  NECK:  Supple, no jugular venous distention. No thyroid enlargement. LUNGS: +diminished breath sounds in the lung bases bilaterally,  in place. No use of accessory muscles of respiration.  CARDIOVASCULAR: RRR, S1, S2 normal. No murmurs, rubs, or gallops.  ABDOMEN: Soft, nontender, nondistended. Bowel sounds present.  EXTREMITIES: No pedal edema, cyanosis, or clubbing.  NEUROLOGIC: CN 2-12 intact, no focal deficits. +global weakness. Sensation intact throughout. Gait not checked.  PSYCHIATRIC: The patient is alert and oriented x 3.  SKIN: No obvious rash, lesion, or ulcer.  LABORATORY PANEL:  Male CBC Recent Labs  Lab 08/05/19 0931  WBC 5.7  HGB 9.9*  HCT 33.0*  PLT 218   ------------------------------------------------------------------------------------------------------------------ Chemistries  Recent Labs  Lab 08/05/19 0931  NA 131*  K 4.4  CL 91*  CO2 30  GLUCOSE 147*  BUN 26*  CREATININE 1.32*  CALCIUM 8.5*  AST 14*  ALT 10  ALKPHOS 133*  BILITOT 0.4   RADIOLOGY:  No results found. ASSESSMENT AND PLAN:   Atypical chest pain with a history of CAD s/p CABG x 4 in 2010. -Recent ECHO with normal LV systolic function -  Troponins are negative -Patient declines stress test -Cardiology consulted- recommended continuing medical therapy and following up as an outpatient  Hyponatremia- improved with IVFs -Stop IVFs -Can consider restarting home lasix in the morning  Chronic diastolic heart  failure- has chronic lower extremity edema, but does not appear acutely volume overloaded -Stop IVFs -Will restart lasix tomorrow  Chronic respiratory failure due to COPD- uses 5-6L O2 at home. No signs of acute exacerbation. -Continue home inhalers  Hyperkalemia- resolved.  Type 2 diabetes- blood sugars mildly elevated -Continue lantus and SSI  Hyperlipidemia- LDL 119 -Continue home lipitor  Chronic normocytic anemia- stable. No active bleeding. -Monitor  All the records are reviewed and case discussed with Care Management/Social Worker. Management plans discussed with the patient, family and they are in agreement.  CODE STATUS: Full Code  TOTAL TIME TAKING CARE OF THIS PATIENT: 50 minutes.   More than 50% of the time was spent in counseling/coordination of care: YES  POSSIBLE D/C tomorrow, DEPENDING ON CLINICAL CONDITION.   Berna Spare [name] M.D on 08/05/2019 at 3:31 PM  Between 7am to 6pm - Pager - [phone]  After 6pm go to [email or web address] - Technical brewer [facility]  Office  [phone]  CC: Primary care physician; [name], MD  Note: This dictation was prepared with Dragon dictation along with smaller phrase technology. Any transcriptional errors that result from this process are unintentional.

## 2019-08-05 NOTE — Progress Notes (Signed)
Inpatient Diabetes Program Recommendations  AACE/ADA: New Consensus Statement on Inpatient Glycemic Control   Target Ranges:  Prepandial:   less than 140 mg/dL      Peak postprandial:   less than 180 mg/dL (1-2 hours)      Critically ill patients:  140 - 180 mg/dL   Results for [name], [name] (MRN [id number]) as of 08/05/2019 12:34  Ref. Range 08/04/2019 15:58 08/04/2019 17:14 08/04/2019 21:30 08/05/2019 08:16 08/05/2019 12:27  Glucose-Capillary Latest Ref Range: 70 - 99 mg/dL 145 (H) 172 (H)  Novolog 3 units  Lantus 20 units @ 17:05 198 (H) 155 (H)  Novolog 3 units  Lantus 20 units @ 10:00 203 (H)  Results for [name], [name] (MRN [id number]) as of 08/05/2019 12:34  Ref. Range 05/06/2019 05:43 06/21/2019 13:30  Hemoglobin A1C Latest Ref Range: 4.8 - 5.6 % 6.4 (H) 6.5 (H)   Review of Glycemic Control  Diabetes history: DM2 Outpatient Diabetes medications: Lantus 35 units BID, Novolog 25 units TID with meals Current orders for Inpatient glycemic control: Lantus 20 units daily, Novolog 0-15 units TID with meals, Novolog 0-5 units QHS  Inpatient Diabetes Program Recommendations:   Insulin-Meal Coverage: Please consider ordering Novolog 4 units TID with meals for meal coverage if patient eats at least 50% of meals.  NOTE: Noted consult. Chart reviewed. Noted patient takes Lantus and Novolog outpatient and A1C was 6.5% on 06/21/19. Will follow while inpatient and make further recommendations if needed.  Thanks, Barnie Alderman, RN, MSN, CDE Diabetes Coordinator Inpatient Diabetes Program [phone] (Team Pager from 8am to 5pm)

## 2019-08-05 NOTE — Evaluation (Signed)
Physical Therapy Evaluation Patient Details Name: [name] MRN: [id number] DOB: 04/12/1941 Today's Date: 08/05/2019   History of Present Illness  78 y.o. male admitted for chest pain. PMH includes COPD/chronic respiratory failure on 5-6 L, AAA, CAD, bipolar disorder, and DM.  Clinical Impression  Pt is a pleasant 78 year old F who was admitted for chest pain. Upon entry, pt is sitting on the [facility]; he ambulated there without help and reports that it was "not okay" getting there. Pt performs bed mobility with min assist, transfers with mod assist, and ambulation with min assist and handheld +1. He reports that he does not amb regularly more than 10-15 feet at a time (the distance to his bathroom). Pt was limited during the session by his pain as well as agitation. He refuses attempts at exercises due to pain, but doesn't describe in depth the pain he is experiencing. He is frustrated with his current physical state. Pt demonstrates deficits with strength, activity tolerance, and pain management. Pt will benefit from skilled PT to address above deficits; current follow-up care recommendation is SNF.      Follow Up Recommendations SNF    Equipment Recommendations  None recommended by PT    Recommendations for Other Services       Precautions / Restrictions Precautions Precautions: None Restrictions Weight Bearing Restrictions: No      Mobility  Bed Mobility Overal bed mobility: Needs Assistance Bed Mobility: Sit to Supine       Sit to supine: Min assist   General bed mobility comments: Min assist provided for LE's and positioning once in bed  Transfers Overall transfer level: Needs assistance Equipment used: 1 person hand held assist Transfers: Sit to/from Stand Sit to Stand: Mod assist         General transfer comment: Pt performed impulsive transfer from the [facility] to the bed. Requires moderate assist from PT using handheld assist  Ambulation/Gait Ambulation/Gait  assistance: Min assist Gait Distance (Feet): 3 Feet Assistive device: 1 person hand held assist Gait Pattern/deviations: Step-to pattern;Trunk flexed     General Gait Details: Pt amb in slow and uncoordinated manner; 1 person handheld min assist applied  Stairs            Wheelchair Mobility    Modified Rankin (Stroke Patients Only)       Balance Overall balance assessment: Needs assistance;History of Falls Sitting-balance support: Feet supported Sitting balance-Leahy Scale: Good     Standing balance support: Single extremity supported Standing balance-Leahy Scale: Poor Standing balance comment: Pt requires support to stand and appears unsteady when amb                             Pertinent Vitals/Pain Pain Assessment: (Reports pain, doesnt describe or comply w therex) Pain Location: BLE's Pain Descriptors / Indicators: Constant    Home Living Family/patient expects to be discharged to:: Private residence Living Arrangements: Spouse/significant other Available Help at Discharge: Family;Personal care attendant Type of Home: House Home Access: Stairs to enter Entrance Stairs-Rails: Psychiatric nurse of Steps: 6 Home Layout: One level Home Equipment: Environmental consultant - 2 wheels;Cane - single point;Bedside commode;Other (comment) Additional Comments: Pt reports that he does not amb more than 10-15 ft at a time. Sits on couch all day and does not shower    Prior Function Level of Independence: Needs assistance   Gait / Transfers Assistance Needed: walks no more than 10-15 ft; has devices but  no comment on their usage  ADL's / Homemaking Assistance Needed: Spouse helps with ADL's        Hand Dominance   Dominant Hand: Right    Extremity/Trunk Assessment   Upper Extremity Assessment Upper Extremity Assessment: Generalized weakness    Lower Extremity Assessment Lower Extremity Assessment: Generalized weakness    Cervical / Trunk  Assessment Cervical / Trunk Assessment: Kyphotic  Communication   Communication: HOH  Cognition Arousal/Alertness: Awake/alert Behavior During Therapy: Agitated Overall Cognitive Status: Within Functional Limits for tasks assessed                                 General Comments: Pt was agitated throughout treatment      General Comments General comments (skin integrity, edema, etc.): Pt is verbally agressive. Not agreeable to perform exercises due to pain. LE strength difficult to assess due to pain, but pt appears generally weak.    Exercises Other Exercises Other Exercises: Seated EOB, R LE, 10 reps: LAQ, AP; L LE, 10 reps: AP   Assessment/Plan    PT Assessment Patient needs continued PT services  PT Problem List Decreased strength;Decreased activity tolerance;Decreased balance;Decreased mobility;Decreased range of motion;Decreased coordination;Cardiopulmonary status limiting activity;Pain       PT Treatment Interventions DME instruction;Gait training;Functional mobility training;Therapeutic activities;Therapeutic exercise;Balance training;Patient/family education    PT Goals (Current goals can be found in the Care Plan section)  Acute Rehab PT Goals Patient Stated Goal: to return home PT Goal Formulation: With patient Time For Goal Achievement: 08/19/19 Potential to Achieve Goals: Fair    Frequency Min 2X/week   Barriers to discharge        Co-evaluation               AM-PAC PT "6 Clicks" Mobility  Outcome Measure Help needed turning from your back to your side while in a flat bed without using bedrails?: A Lot Help needed moving from lying on your back to sitting on the side of a flat bed without using bedrails?: A Lot Help needed moving to and from a bed to a chair (including a wheelchair)?: A Lot Help needed standing up from a chair using your arms (e.g., wheelchair or bedside chair)?: A Lot Help needed to walk in hospital room?: A Lot Help  needed climbing 3-5 steps with a railing? : A Lot 6 Click Score: 12    End of Session   Activity Tolerance: Treatment limited secondary to agitation;Patient limited by pain Patient left: in bed;with bed alarm set Nurse Communication: Mobility status PT Visit Diagnosis: History of falling (Z91.81);Muscle weakness (generalized) (M62.81)    Time: 1415-1450 PT Time Calculation (min) (ACUTE ONLY): 35 min   Charges:   PT Evaluation $PT Eval Low Complexity: 1 Low PT Treatments $Therapeutic Exercise: 8-22 mins       Victorian Gunn, SPT   Tremaine Earwood 08/05/2019, 4:47 PM

## 2019-08-05 NOTE — Care Management Obs Status (Signed)
[place] NOTIFICATION   Patient Details  Name: GIDDEON BESSINGER MRN: [id number] Date of Birth: [birth year]   Medicare Observation Status Notification Given:  Yes  Patient did not want to sign, CSW signed for patient and left in his room.   Ross Ludwig, LCSW 08/05/2019, 5:31 PM

## 2019-08-06 ENCOUNTER — Observation Stay: Payer: Medicare HMO

## 2019-08-06 LAB — URINALYSIS, COMPLETE (UACMP) WITH MICROSCOPIC
Bacteria, UA: NONE SEEN
Bilirubin Urine: NEGATIVE
Glucose, UA: NEGATIVE mg/dL
Ketones, ur: NEGATIVE mg/dL
Nitrite: NEGATIVE
Protein, ur: NEGATIVE mg/dL
Specific Gravity, Urine: 1.006 (ref 1.005–1.030)
pH: 6 (ref 5.0–8.0)

## 2019-08-06 LAB — CBC
HCT: 31.4 % — ABNORMAL LOW (ref 39.0–52.0)
Hemoglobin: 9.3 g/dL — ABNORMAL LOW (ref 13.0–17.0)
MCH: 26.3 pg (ref 26.0–34.0)
MCHC: 29.6 g/dL — ABNORMAL LOW (ref 30.0–36.0)
MCV: 88.7 fL (ref 80.0–100.0)
Platelets: 245 10*3/uL (ref 150–400)
RBC: 3.54 MIL/uL — ABNORMAL LOW (ref 4.22–5.81)
RDW: 17.1 % — ABNORMAL HIGH (ref 11.5–15.5)
WBC: 7.2 10*3/uL (ref 4.0–10.5)
nRBC: 0 % (ref 0.0–0.2)

## 2019-08-06 LAB — HEMOGLOBIN A1C
Hgb A1c MFr Bld: 6.8 % — ABNORMAL HIGH (ref 4.8–5.6)
Mean Plasma Glucose: 148 mg/dL

## 2019-08-06 LAB — BASIC METABOLIC PANEL
Anion gap: 12 (ref 5–15)
BUN: 22 mg/dL (ref 8–23)
CO2: 31 mmol/L (ref 22–32)
Calcium: 8.8 mg/dL — ABNORMAL LOW (ref 8.9–10.3)
Chloride: 89 mmol/L — ABNORMAL LOW (ref 98–111)
Creatinine, Ser: 1.26 mg/dL — ABNORMAL HIGH (ref 0.61–1.24)
GFR calc Af Amer: >60 mL/min (ref >60)
GFR calc non Af Amer: 55 mL/min — ABNORMAL LOW (ref >60)
Glucose, Bld: 215 mg/dL — ABNORMAL HIGH (ref 70–99)
Potassium: 4.7 mmol/L (ref 3.5–5.1)
Sodium: 132 mmol/L — ABNORMAL LOW (ref 135–145)

## 2019-08-06 LAB — GLUCOSE, CAPILLARY
Glucose-Capillary: 188 mg/dL — ABNORMAL HIGH (ref 70–99)
Glucose-Capillary: 191 mg/dL — ABNORMAL HIGH (ref 70–99)
Glucose-Capillary: 138 mg/dL — ABNORMAL HIGH (ref 70–99)

## 2019-08-06 MED ORDER — FUROSEMIDE 10 MG/ML IJ SOLN
40.0000 mg | Freq: Once | INTRAMUSCULAR | Status: AC
  Administered 2019-08-06: 40 mg via INTRAVENOUS
  Filled 2019-08-06: qty 4

## 2019-08-06 MED ORDER — IPRATROPIUM-ALBUTEROL 0.5-2.5 (3) MG/3ML IN SOLN
3.0000 mL | Freq: Four times a day (QID) | RESPIRATORY_TRACT | Status: DC
  Administered 2019-08-07 (×4): 3 mL via RESPIRATORY_TRACT
  Filled 2019-08-06 (×4): qty 3

## 2019-08-06 MED ORDER — INSULIN ASPART 100 UNIT/ML ~~LOC~~ SOLN
3.0000 [IU] | Freq: Three times a day (TID) | SUBCUTANEOUS | Status: DC
  Administered 2019-08-06 – 2019-08-07 (×4): 3 [IU] via SUBCUTANEOUS
  Filled 2019-08-06 (×4): qty 1

## 2019-08-06 MED ORDER — BISACODYL 5 MG PO TBEC
5.0000 mg | DELAYED_RELEASE_TABLET | Freq: Every day | ORAL | Status: DC | PRN
  Administered 2019-08-06: 5 mg via ORAL
  Filled 2019-08-06: qty 1

## 2019-08-06 NOTE — Progress Notes (Signed)
Inpatient Diabetes Program Recommendations  AACE/ADA: New Consensus Statement on Inpatient Glycemic Control  Target Ranges:  Prepandial:   less than 140 mg/dL      Peak postprandial:   less than 180 mg/dL (1-2 hours)      Critically ill patients:  140 - 180 mg/dL   Results for [name], [name] (MRN [id number]) as of 08/06/2019 09:40  Ref. Range 08/05/2019 08:16 08/05/2019 12:27 08/05/2019 17:26 08/05/2019 21:01 08/06/2019 07:47  Glucose-Capillary Latest Ref Range: 70 - 99 mg/dL 155 (H) 203 (H) 191 (H) 163 (H) 188 (H)   Review of Glycemic Control  Diabetes history: DM2 Outpatient Diabetes medications: Lantus 35 units BID, Novolog 25 units TID with meals Current orders for Inpatient glycemic control: Lantus 20 units daily, Novolog 0-15 units TID with meals, Novolog 0-5 units QHS  Inpatient Diabetes Program Recommendations:   Insulin-Meal Coverage: Please consider ordering Novolog 3 units TID with meals for meal coverage if patient eats at least 50% of meals.  NOTE: Noted consult. Chart reviewed. Noted patient takes Lantus and Novolog outpatient and A1C was 6.5% on 06/21/19. Will follow while inpatient and make further recommendations if needed.  Thanks, Barnie Alderman, RN, MSN, CDE Diabetes Coordinator Inpatient Diabetes Program [phone] (Team Pager from 8am to 5pm)

## 2019-08-06 NOTE — Plan of Care (Signed)
Patient refusing to mobilize or participate in ROM exercises, is willing to use bedside commode but reluctant to work with therapies.

## 2019-08-06 NOTE — TOC Progression Note (Addendum)
Transition of Care [facility]) - Progression Note    Patient Details  Name: [name] MRN: [id number] Date of Birth: [birth year]  Transition of Care [facility]) CM/SW Contact  Ross Ludwig, [place] Phone Number: 08/06/2019, 11:54 AM  Clinical Narrative:     CSW attempted to see patient 4x this morning to talk to him about SNF placement, every time patient was unavailable.  CSW will try to see patient again after lunch.  4:00pm  CSW spoke to patient to discuss SNF placement verse going home with home health.  Patient stated he does not want to go to SNF because he is worried that he would die there.  CSW informed patient that it would be for short term rehab, but he is worried that if he goes, he may not come out.  CSW expressed understanding.  CSW discussed with patient having home health come out to his house, patient stated that he had a home health agency in the past which he did not like.  CSW discussed that patient could be set up with a different home health agency.  Patient is agreeable to trying home health again.  Patient discussed how he has been married to his wife for 27 years and she does not remember the good things, only the bad things.  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  Patient expressed how he states he made changes, to improve their relationship, however she still does not want to change on her end.  Patient apologized for being rude, and asked for CSW to come back tomorrow.  CSW will follow up with him tomorrow, patient still refusing SNF.   Expected Discharge Plan: Skilled Nursing Facility Barriers to Discharge: Continued Medical Work up, Ship broker  Expected Discharge Plan and Services Expected Discharge Plan: [place]                                               Social Determinants of Health (SDOH) Interventions    Readmission Risk  Interventions Readmission Risk Prevention Plan 07/08/2019 04/13/2019  Transportation Screening Complete Complete  Medication Review Press photographer) Complete Complete  PCP or Specialist appointment within 3-5 days of discharge Complete Complete  HRI or Home Care Consult Complete Complete  SW Recovery Care/Counseling Consult Complete Complete  Palliative Care Screening Patient Refused Patient [place] Not Applicable Patient Refused  Some recent data might be hidden

## 2019-08-06 NOTE — Progress Notes (Signed)
[facility] [place] [facility] [place] NAME: [name]    MR#:  [id number]  DATE OF BIRTH:  1941/08/24  SUBJECTIVE:   Patient denies any chest pain this morning.  He endorses shortness of breath that is worse with exertion.  He also endorses worsening lower extremity edema.  He states that he has been unable to work with physical therapy, mostly due to swelling and severe pain in his left knee.  He denies any erythema.  He denies any trauma to the knee.  No fevers or chills.  REVIEW OF SYSTEMS:  Review of Systems  Constitutional: Negative for chills and fever.  HENT: Negative.  Negative for congestion and sore throat.   Eyes: Negative for blurred vision and double vision.  Respiratory: Positive for shortness of breath. Negative for cough.   Cardiovascular: Negative for chest pain and palpitations.  Gastrointestinal: Negative for nausea and vomiting.  Genitourinary: Negative for dysuria and urgency.  Musculoskeletal: Positive for joint pain. Negative for back pain and neck pain.  Neurological: Negative for dizziness and headaches.  Psychiatric/Behavioral: Negative for depression. The patient is not nervous/anxious.     DRUG ALLERGIES:   Allergies  Allergen Reactions  . Morphine And Related Shortness Of Breath, Nausea And Vomiting, Rash and Other (See Comments)    Reaction:  Hallucinations   . Penicillins Anaphylaxis, Hives and Other (See Comments)    10/02/18 - discussed with patient and he states he can tolerate penicillin capsules and had some hives when he was 78yo and received pcn injections  Has patient had a PCN reaction causing immediate rash, facial/tongue/throat swelling, SOB or lightheadedness with hypotension: Yes Has patient had a PCN reaction causing severe rash involving mucus membranes or skin necrosis: No Has patient had a PCN reaction that required hospitalization No Has patient had a PCN reaction occurring within the last 10 y  .  Zolpidem Shortness Of Breath  . Demerol [Meperidine] Other (See Comments)    Reaction:  Hallucinations    . Dilaudid [Hydromorphone Hcl] Other (See Comments)    Reaction:  Hallucinations   . Levofloxacin Other (See Comments)    Reaction:  Unknown    VITALS:  Blood pressure 134/68, pulse 100, temperature 98.1 F (36.7 C), temperature source Oral, resp. rate 16, height 6\' 3"  (1.905 m), weight 127.6 kg, SpO2 98 %. PHYSICAL EXAMINATION:  Physical Exam  GENERAL:  Laying in the bed with no acute distress.  HEENT: Head atraumatic, normocephalic. Pupils equal, round, reactive to light and accommodation. No scleral icterus. Extraocular muscles intact. Oropharynx and nasopharynx clear.  NECK:  Supple, no jugular venous distention. No thyroid enlargement. LUNGS: +diminished breath sounds in the lung bases bilaterally, [place] in place. No use of accessory muscles of respiration.  CARDIOVASCULAR: RRR, S1, S2 normal. No murmurs, rubs, or gallops.  ABDOMEN: Soft, nontender, nondistended. Bowel sounds present.  EXTREMITIES: No cyanosis, or clubbing. +left knee swelling. No erythema. 1+ bilateral LE edema. NEUROLOGIC: CN 2-12 intact, no focal deficits. +global weakness. Sensation intact throughout. Gait not checked.  PSYCHIATRIC: The patient is alert and oriented x 3.  SKIN: No obvious rash, lesion, or ulcer.  LABORATORY PANEL:  Male CBC Recent Labs  Lab 08/06/19 0524  WBC 7.2  HGB 9.3*  HCT 31.4*  PLT 245   ------------------------------------------------------------------------------------------------------------------ Chemistries  Recent Labs  Lab 08/05/19 0931 08/06/19 0524  NA 131* 132*  K 4.4 4.7  CL 91* 89*  CO2 30 31  GLUCOSE 147* 215*  BUN 26* 22  CREATININE 1.32* 1.26*  CALCIUM 8.5* 8.8*  AST 14*  --   ALT 10  --   ALKPHOS 133*  --   BILITOT 0.4  --    RADIOLOGY:  Mr Knee Left Wo Contrast  Result Date: 08/06/2019 CLINICAL DATA:  Chronic knee pain. EXAM: MRI OF THE LEFT  KNEE WITHOUT CONTRAST TECHNIQUE: Multiplanar, multisequence MR imaging of the knee was performed. No intravenous contrast was administered. COMPARISON:  Radiographs 07/30/2019 FINDINGS: Examination is significantly limited by patient motion. 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 MENISCI Medial meniscus: Inferior articular surface tears along the posterior horn midbody junction region. Lateral meniscus:  Grossly intact. LIGAMENTS Cruciates:  Intact Collaterals:  Intact CARTILAGE Patellofemoral: Limited evaluation. No obvious full-thickness cartilage defects. Medial: Very limited examination. No obvious full-thickness cartilage defects. Lateral: Limited examination. No obvious full-thickness cartilage defects. Joint:  Moderate to large joint effusion and mild synovitis. Popliteal Fossa:  Moderate-sized leaking Baker's cyst. Extensor Mechanism: The patella retinacular structures are intact and the quadriceps and patellar tendons are intact. Mild proximal and distal patellar tendinopathy. Bones: Moderate edema like signal changes involving the posterior aspect of the medial tibia. This could be a bone contusion, stress reaction or subtle subchondral stress fracture. Other: Grossly normal knee musculature. IMPRESSION: 1. Very limited examination due to patient motion. 2. Inferior articular surface tearing involving the posterior horn mid body junction region of the medial meniscus. 3. Intact ligamentous structures. 4. Suspect mild degenerative chondrosis for age but no gross cartilage defects or osteochondral lesion. 5. Edema like signal changes in the posterior aspect of the medial tibia could be a bone contusion, stress reaction or subtle subchondral stress fracture. 6. Moderate to large joint effusion and moderate-sized leaking Baker's cyst. Electronically Signed   By: Marijo Sanes M.D.   On: 08/06/2019 12:49   ASSESSMENT AND PLAN:   Atypical chest pain with a history of CAD s/p CABG x 4 in 2010.  -Recent ECHO with normal LV systolic function -Troponins are negative -Cardiology consulted- recommended continuing medical therapy and following up as an outpatient  Hyponatremia- improved with IVFs -Stop IVFs -Will give a dose of lasix 40mg  IV today  Left knee swelling- possibly ligamentous tear.  -Left knee x-ray is unremarkable -Check MRI left knee  Chronic diastolic heart failure- has chronic lower extremity edema, but does not appear acutely volume overloaded -Will give a dose of lasix today  Chronic respiratory failure due to COPD- uses 5-6L O2 at home. No signs of acute exacerbation. -Continue home inhalers  Type 2 diabetes- blood sugars mildly elevated -Continue lantus and SSI  Hyperlipidemia- LDL 119 -Continue home lipitor  Chronic normocytic anemia- stable. No active bleeding. -Monitor  PT recommending SNF, but patient states he would rather go home with home health.  All the records are reviewed and case discussed with Care Management/Social Worker. Management plans discussed with the patient, family and they are in agreement.  CODE STATUS: Full Code  TOTAL TIME TAKING CARE OF THIS PATIENT: 40 minutes.   More than 50% of the time was spent in counseling/coordination of care: YES  POSSIBLE D/C tomorrow, DEPENDING ON CLINICAL CONDITION.   Berna Spare Beadie Matsunaga M.D on 08/06/2019 at 3:47 PM  Between 7am to 6pm - Pager [phone]  After 6pm go to [email or web address] - Technical brewer [facility]  Office  [phone]  CC: Primary care physician; [name], MD  Note: This dictation was prepared with Dragon dictation along  with smaller phrase technology. Any transcriptional errors that result from this process are unintentional.

## 2019-08-06 NOTE — Progress Notes (Signed)
Physical Therapy Treatment Patient Details Name: MASOUD SCHMIDTKE MRN: [id number] DOB: [birth year] Today's Date: 08/06/2019    History of Present Illness 78 y.o. male admitted for chest pain. PMH includes COPD/chronic respiratory failure on 5-6 L, AAA, CAD, bipolar disorder, and DM.    PT Comments    Pt in bed, napkin on plate.  Stated "Can't you see I'm eating?"  Asked pt if he wanted me to return later and he stated he needed to use the bathroom.   Offered and accepted assistance.  Pt to edge of bed with rail and min guard.  Steady in sitting, Refuses gait belt or use of walker.  Turns long way around to commode tangling himself up in catheter tubing despite cues.  Once sitting, he continues to repeatedly ask what was said about evaluation yesterday.  Explained what was written in evaluation several times but he continued to state evaluating therapist was lying and that I did not know what was written.  I explained several times that when RN was done on the computer in his room that I would re-look at eval and read it to him.  RN in chart for medication documentation so it was unavailable at the time.  Explained again what was written from my memory from chart review this AM and that I would look again when she was finished.  We had the same conversation several times with no resolution and pt getting increasingly agitated and argumentative.  Explained to pt that SNF was recommended due to his limited mobility and knee pain but he again stated I was lying along with therapists yesterday.  Explained to pt that we made our recommendations and that he did not have to agree with it but that we could not change it because he disagreed.  Explained that if he disagrees with our assessments and that he could chose to follow them or not.  Pt turns TV back on during conversation which I had muted earlier upon arrival.  I eventually excused myself from the room and RN in room to assist with transfer back to bed.      Follow Up Recommendations  SNF     Equipment Recommendations       Recommendations for Other Services       Precautions / Restrictions Precautions Precautions: Fall Restrictions Weight Bearing Restrictions: No    Mobility  Bed Mobility Overal bed mobility: Needs Assistance Bed Mobility: Supine to Sit     Supine to sit: Min guard        Transfers Overall transfer level: Needs assistance Equipment used: 1 person hand held assist Transfers: Sit to/from Stand Sit to Stand: Min guard            Ambulation/Gait             General Gait Details: deferred due to agitation and pt generally being Education officer, community Rankin (Stroke Patients Only)       Balance Overall balance assessment: Needs assistance Sitting-balance support: Feet supported Sitting balance-Leahy Scale: Good     Standing balance support: Single extremity supported Standing balance-Leahy Scale: Poor Standing balance comment: Pt requires support to stand and appears unsteady when turning                            Cognition Arousal/Alertness: Awake/alert Behavior During Therapy: Agitated  Overall Cognitive Status: Within Functional Limits for tasks assessed                                 General Comments: Pt was agitated throughout treatment      Exercises Other Exercises Other Exercises: to commode    General Comments        Pertinent Vitals/Pain Pain Location: BLE's, knee - does not rate Pain Descriptors / Indicators: Constant    Home Living                      Prior Function            PT Goals (current goals can now be found in the care plan section) Progress towards PT goals: Not progressing toward goals - comment    Frequency    Min 2X/week      PT Plan Current plan remains appropriate    Co-evaluation              AM-PAC PT "6 Clicks" Mobility    Outcome Measure  Help needed turning from your back to your side while in a flat bed without using bedrails?: A Little Help needed moving from lying on your back to sitting on the side of a flat bed without using bedrails?: A Little Help needed moving to and from a bed to a chair (including a wheelchair)?: A Little Help needed standing up from a chair using your arms (e.g., wheelchair or bedside chair)?: A Little Help needed to walk in hospital room?: A Lot Help needed climbing 3-5 steps with a railing? : A Lot 6 Click Score: 16    End of Session   Activity Tolerance: Patient tolerated treatment well Patient left: Other (comment);with nursing/sitter in room         Time: 0921-0933 PT Time Calculation (min) (ACUTE ONLY): 12 min  Charges:  $Therapeutic Activity: 8-22 mins                     Chesley Noon, PTA 08/06/19, 9:53 AM

## 2019-08-07 DIAGNOSIS — Z0279 Encounter for issue of other medical certificate: Secondary | ICD-10-CM

## 2019-08-07 DIAGNOSIS — Z0189 Encounter for other specified special examinations: Secondary | ICD-10-CM

## 2019-08-07 LAB — CBC
HCT: 31.4 % — ABNORMAL LOW (ref 39.0–52.0)
Hemoglobin: 9.3 g/dL — ABNORMAL LOW (ref 13.0–17.0)
MCH: 26.6 pg (ref 26.0–34.0)
MCHC: 29.6 g/dL — ABNORMAL LOW (ref 30.0–36.0)
MCV: 89.7 fL (ref 80.0–100.0)
Platelets: 264 10*3/uL (ref 150–400)
RBC: 3.5 MIL/uL — ABNORMAL LOW (ref 4.22–5.81)
RDW: 17.1 % — ABNORMAL HIGH (ref 11.5–15.5)
WBC: 7.5 10*3/uL (ref 4.0–10.5)
nRBC: 0 % (ref 0.0–0.2)

## 2019-08-07 LAB — GLUCOSE, CAPILLARY
Glucose-Capillary: 208 mg/dL — ABNORMAL HIGH (ref 70–99)
Glucose-Capillary: 284 mg/dL — ABNORMAL HIGH (ref 70–99)
Glucose-Capillary: 171 mg/dL — ABNORMAL HIGH (ref 70–99)
Glucose-Capillary: 260 mg/dL — ABNORMAL HIGH (ref 70–99)

## 2019-08-07 LAB — BASIC METABOLIC PANEL
Anion gap: 9 (ref 5–15)
BUN: 18 mg/dL (ref 8–23)
CO2: 33 mmol/L — ABNORMAL HIGH (ref 22–32)
Calcium: 8.4 mg/dL — ABNORMAL LOW (ref 8.9–10.3)
Chloride: 90 mmol/L — ABNORMAL LOW (ref 98–111)
Creatinine, Ser: 1.23 mg/dL (ref 0.61–1.24)
GFR calc Af Amer: >60 mL/min (ref >60)
GFR calc non Af Amer: 56 mL/min — ABNORMAL LOW (ref >60)
Glucose, Bld: 238 mg/dL — ABNORMAL HIGH (ref 70–99)
Potassium: 4.8 mmol/L (ref 3.5–5.1)
Sodium: 132 mmol/L — ABNORMAL LOW (ref 135–145)

## 2019-08-07 MED ORDER — POLYETHYLENE GLYCOL 3350 17 G PO PACK: 17.0000 g | PACK | Freq: Every day | ORAL | 0 refills | Status: DC | PRN

## 2019-08-07 MED ORDER — ATORVASTATIN CALCIUM 40 MG PO TABS: 40.0000 mg | ORAL_TABLET | Freq: Every day | ORAL | 0 refills | Status: AC

## 2019-08-07 MED ORDER — HYDROCODONE-ACETAMINOPHEN 5-325 MG PO TABS: 1.0000 | ORAL_TABLET | ORAL | 0 refills | Status: DC | PRN

## 2019-08-07 NOTE — Progress Notes (Signed)
Wife called and notified of patient in route home at this time.

## 2019-08-07 NOTE — Discharge Instructions (Signed)
It was so nice to meet you during this hospitalization!  You came into the hospital with chest pain. We did not think that your pain was coming from your heart.  We found that your cholesterol level was high, so we started a cholesterol medicine called Lipitor. Please take 40mg  daily.  We did an MRI of your knee that showed you had a torn meniscus. This is the cartilage that sits between your thigh bone and your shin bone in your knee. You can use norco as needed for severe pain.  Take care, Dr. Brett Albino

## 2019-08-07 NOTE — Progress Notes (Signed)
Released to care of [employer] [facility]

## 2019-08-07 NOTE — TOC Progression Note (Addendum)
Transition of Care [facility]) - Progression Note    Patient Details  Name: [name] MRN: [id number] Date of Birth: 02-Oct-1941  [facility] CM/SW Contact  Ross Ludwig, [place] Phone Number: 08/07/2019, 1:52 PM  Clinical Narrative:     CSW was informed that patient is currently being followed by [facility] for palliative care.  CSW discussed with patient home health agencies, and he agreed to have [facility] see him.  CSW contacted [name] at [facility] and she agreed to accept patient, because they have worked with them before.  CSW asked what equipment patient had, and he said he had a bedside commode, and a walker, CSW asked if he felt like he needed a wheelchair or hospital bed and he said no.  Patient's wife was on the phone with unit director, TOC lead Aida Raider, and this CSW, patient's wife is agreeable to having home health social work, Therapist, sports, Engineer, production, PT, and OT through [employer] and palliative care through [facility].  Patient's wife was informed that a list of care agencies can be provided if they want to make arrangements for extra in home care, CSW informed her they would have to be private pay.  CSW discussed with home health agency if social work can assist with helping patient find extra in home care, and also apply for Medicaid so he can get on the [place] program.  CSW offered to provide transportation service information like dial a ride, and patient is agreeable to it.  CSW to add information to packet.  Patient's wife is agreeable to having patient transported home via EMS and will accept him back tonight.   Expected Discharge Plan: [place] Barriers to Discharge: Continued Medical Work up, Ship broker  Expected Discharge Plan and Services Expected Discharge Plan: [place]         Expected Discharge Date: 08/07/19                                     Social Determinants of Health (SDOH) Interventions     Readmission Risk Interventions Readmission Risk Prevention Plan 07/08/2019 04/13/2019  Transportation Screening Complete Complete  Medication Review Press photographer) Complete Complete  PCP or Specialist appointment within 3-5 days of discharge Complete Complete  HRI or Home Care Consult Complete Complete  SW Recovery Care/Counseling Consult Complete Complete  Palliative Care Screening Patient Refused Patient [place] Not Applicable Patient Refused  Some recent data might be hidden

## 2019-08-07 NOTE — Progress Notes (Signed)
Please note, patient is currently followed by [facility] community Palliative at home. CSW Evette Cristal made aware.  Flo Shanks BSN, RN, [place] [phone]
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
## 2019-08-07 NOTE — Discharge Summary (Signed)
[facility] NAME: [name]    MR#:  [id number]  DATE OF BIRTH:  1941/03/10  DATE OF ADMISSION:  08/04/2019   ADMITTING PHYSICIAN: Bettey Costa, MD  DATE OF DISCHARGE: 08/07/19  PRIMARY CARE PHYSICIAN: [name], MD   ADMISSION DIAGNOSIS:  Hyponatremia [E87.1] Generalized weakness [R53.1] Nonspecific chest pain [R07.9] Chronic obstructive pulmonary disease, unspecified COPD type ([place]) [J44.9] Congestive heart failure, unspecified HF chronicity, unspecified heart failure type ([place]) [I50.9] DISCHARGE DIAGNOSIS:  Active Problems:   Chest pain  SECONDARY DIAGNOSIS:   Past Medical History:  Diagnosis Date   AAA (abdominal aortic aneurysm) ([place])    a. 12/2008 s/p 7cm, endovascular repair with coiling right hypogastric artery    Acute [name] ulcer    Adenomatous duodenal polyp    Allergic rhinitis, cause unspecified    Anxiety    AVM (arteriovenous malformation) of colon with hemorrhage    Bipolar 1 disorder, mixed, moderate ([place]) 04/16/2015   CAD (coronary artery disease)    a. 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   Chronic diastolic CHF (congestive heart failure) ([place])    a. 04/2015 Echo: EF 55-60%, no rwma, Gr 1 DD, mild AI.   Complication of anesthesia    "if they sedate me for too long, they have to intubate me; then they can't get me to come out of it" (04/03/2017)   COPD (chronic obstructive pulmonary disease) ([place])    a. GOLD stage IV, started home O2. Severe bullous disease of LUL. Prolonged intubation after surgeries due to COPD.   Depression with anxiety 01/14/2013   Diabetes mellitus with complication (HCC)    Diverticulosis    Duodenal diverticulum    Duodenal ulcer    Emphysema of lung ([place])    Esophagitis    Essential hypertension 08/18/2009   Qualifier: Diagnosis of  By: Doy Mince LPN, Megan     GERD (gastroesophageal reflux disease)    GI bleed  requiring more than 4 units of blood in 24 hours, ICU, or surgery    a. Hx bleeding gastric polyps, cecal & sigmoid AVMS s/p APC 03/30/14   Hiatal hernia    large   History of blood transfusion    "many many many; related to blood loss; anemia"   Hyperlipidemia    Insomnia 08/10/2014   Leucocytosis 12/04/2013   Major depressive disorder    Memory loss    Morbid obesity ([place])    Multiple gastric polyps    Myocardial infarction ([place])    "I think I had a minor one when I had the OHS"   On home oxygen therapy    "7 liters [place] w/oxigenator" (04/03/2017)   Pneumonia 2017   Recurrent Microcytic Anemia    a. presumed chronic GI blood loss.   Type II diabetes mellitus ([place])    Vitamin D deficiency 08/10/2014   HOSPITAL COURSE:   [name] is a 78 year old male presented to the ED with generalized weakness and chest pain.  In the ED, he was noted to have hyponatremia and some chronic lower extremity edema.  He was admitted for further management.  Atypical chest pain with a history of CAD s/p CABG x 4 in 2010- chest pain resolved. -Recent ECHO with normal LV systolic function -Troponins were negative -Cardiology consulted- recommended continuing medical therapy and following up as an outpatient -PT recommended SNF, but patient adamantly declined -Psych consult was obtained and patient was deemed capable of  making his own decisions -Home health PT, OT, RN, aide, and SW were ordered on discharge  -Patient will continue to follow with [facility] as an outpatient  Hyponatremia- improved with IVFs -Home lasix was restarted  Meniscus tear of left knee -Seen on MRI of left knee that was performed this admission -Home health PT was ordered -Patient was given norco tablets on discharge  Chronic diastolic heart failure- has chronic lower extremity edema, but does not appear acutely volume overloaded -Home lasix was continued  Chronic respiratory failure due to COPD-  uses 5-6L O2 at home. No signs of acute exacerbation. Stable on his home 5L O2 on discharge. -Continued home inhalers  Type 2 diabetes- blood sugars mildly elevated -Continued home diabetes meds  Hyperlipidemia- LDL 119 -Continued home lipitor  Chronic normocytic anemia- stable. No active bleeding. -Monitor  Patient is chronically ill with multiple medical problems and is at high risk for readmission.  DISCHARGE CONDITIONS:  CAD s/p CABG x4 Hyponatremia Meniscus tear of left knee Chronic diastolic heart failure Chronic respiratory failure due to COPD Type 2 diabetes Hyperlipidemia Chronic normocytic anemia CONSULTS OBTAINED:  Cardiology DRUG ALLERGIES:   Allergies  Allergen Reactions   Morphine And Related Shortness Of Breath, Nausea And Vomiting, Rash and Other (See Comments)    Reaction:  Hallucinations    Penicillins Anaphylaxis, Hives and Other (See Comments)    10/02/18 - discussed with patient and he states he can tolerate penicillin capsules and had some hives when he was 78yo and received pcn injections  Has patient had a PCN reaction causing immediate rash, facial/tongue/throat swelling, SOB or lightheadedness with hypotension: Yes Has patient had a PCN reaction causing severe rash involving mucus membranes or skin necrosis: No Has patient had a PCN reaction that required hospitalization No Has patient had a PCN reaction occurring within the last 10 y   Zolpidem Shortness Of Breath   Demerol [Meperidine] Other (See Comments)    Reaction:  Hallucinations     Dilaudid [Hydromorphone Hcl] Other (See Comments)    Reaction:  Hallucinations    Levofloxacin Other (See Comments)    Reaction:  Unknown    DISCHARGE MEDICATIONS:   Allergies as of 08/07/2019      Reactions   Morphine And Related Shortness Of Breath, Nausea And Vomiting, Rash, Other (See Comments)   Reaction:  Hallucinations    Penicillins Anaphylaxis, Hives, Other (See Comments)   10/02/18 -  discussed with patient and he states he can tolerate penicillin capsules and had some hives when he was 78yo and received pcn injections Has patient had a PCN reaction causing immediate rash, facial/tongue/throat swelling, SOB or lightheadedness with hypotension: Yes Has patient had a PCN reaction causing severe rash involving mucus membranes or skin necrosis: No Has patient had a PCN reaction that required hospitalization No Has patient had a PCN reaction occurring within the last 10 y   Zolpidem Shortness Of Breath   Demerol [meperidine] Other (See Comments)   Reaction:  Hallucinations     Dilaudid [hydromorphone Hcl] Other (See Comments)   Reaction:  Hallucinations    Levofloxacin Other (See Comments)   Reaction:  Unknown       Medication List    TAKE these medications   ALPRAZolam 0.25 MG tablet Commonly known as: XANAX Take 1 tablet (0.25 mg total) by mouth 3 (three) times daily as needed for anxiety or sleep.   atorvastatin 40 MG tablet Commonly known as: LIPITOR Take 1 tablet (40 mg total) by  mouth daily at 6 PM.   benzonatate 100 MG capsule Commonly known as: TESSALON Take 100 mg by mouth every 8 (eight) hours as needed for cough or congestion.   citalopram 40 MG tablet Commonly known as: CELEXA Take 0.5 tablets (20 mg total) by mouth daily. What changed: how much to take   [employer] Melatonin 5 MG Tabs Generic drug: Melatonin Take 5 mg by mouth at bedtime. For sleep   fluticasone 50 MCG/ACT nasal spray Commonly known as: FLONASE Place 2 sprays into both nostrils daily. What changed:   when to take this  reasons to take this   gabapentin 800 MG tablet Commonly known as: NEURONTIN Take 400 mg by mouth 4 (four) times daily.   HYDROcodone-acetaminophen 5-325 MG tablet Commonly known as: NORCO/VICODIN Take 1 tablet by mouth every 4 (four) hours as needed for moderate pain.   insulin aspart 100 UNIT/ML injection Commonly known as: novoLOG Inject 25 Units into the  skin 3 (three) times daily with meals.   insulin glargine 100 UNIT/ML injection Commonly known as: LANTUS Inject 0.35 mLs (35 Units total) into the skin 2 (two) times daily.   lisinopril 2.5 MG tablet Commonly known as: ZESTRIL Take 1 tablet (2.5 mg total) by mouth daily.   pantoprazole 40 MG tablet Commonly known as: PROTONIX Take 1 tablet (40 mg total) by mouth 2 (two) times daily.   polyethylene glycol 17 g packet Commonly known as: MIRALAX / GLYCOLAX Take 17 g by mouth daily as needed for mild constipation.   tiotropium 18 MCG inhalation capsule Commonly known as: SPIRIVA Place 18 mcg into inhaler and inhale daily.   traMADol 50 MG tablet Commonly known as: ULTRAM Take 1 tablet (50 mg total) by mouth every 6 (six) hours as needed for up to 10 doses.        DISCHARGE INSTRUCTIONS:  1. Follow-up with PCP in 5 days 2.  Follow-up with cardiology in 1-2 weeks DIET:  Cardiac diet and Diabetic diet DISCHARGE CONDITION:  Stable ACTIVITY:  Activity as tolerated OXYGEN:  Home Oxygen: No.  Oxygen Delivery: 5-6 liters/min via Patient connected to nasal cannula oxygen DISCHARGE LOCATION:  home   If you experience worsening of your admission symptoms, develop shortness of breath, life threatening emergency, suicidal or homicidal thoughts you must seek medical attention immediately by calling 911 or calling your MD immediately  if symptoms less severe.  You Must read complete instructions/literature along with all the possible adverse reactions/side effects for all the Medicines you take and that have been prescribed to you. Take any new Medicines after you have completely understood and accpet all the possible adverse reactions/side effects.   Please note  You were cared for by a hospitalist during your hospital stay. If you have any questions about your discharge medications or the care you received while you were in the hospital after you are discharged, you can call the  unit and asked to speak with the hospitalist on call if the hospitalist that took care of you is not available. Once you are discharged, your primary care physician will handle any further medical issues. Please note that NO REFILLS for any discharge medications will be authorized once you are discharged, as it is imperative that you return to your primary care physician (or establish a relationship with a primary care physician if you do not have one) for your aftercare needs so that they can reassess your need for medications and monitor your lab values.    On  the day of Discharge:  VITAL SIGNS:  Blood pressure 110/70, pulse (!) 110, temperature 97.8 F (36.6 C), temperature source Oral, resp. rate 19, height 6\' 3"  (1.905 m), weight 125.5 kg, SpO2 93 %. PHYSICAL EXAMINATION:  GENERAL:  78 y.o.-year-old patient lying in the bed with no acute distress.  EYES: Pupils equal, round, reactive to light and accommodation. No scleral icterus. Extraocular muscles intact.  HEENT: Head atraumatic, normocephalic. Oropharynx and nasopharynx clear.  NECK:  Supple, no jugular venous distention. No thyroid enlargement, no tenderness.  LUNGS: Normal breath sounds bilaterally, no wheezing, rales,rhonchi or crepitation. No use of accessory muscles of respiration.  CARDIOVASCULAR: S1, S2 normal. No murmurs, rubs, or gallops.  ABDOMEN: Soft, non-tender, non-distended. Bowel sounds present. No organomegaly or mass.  EXTREMITIES: No pedal edema, cyanosis, or clubbing.  NEUROLOGIC: Cranial nerves II through XII are intact. Muscle strength 5/5 in all extremities. Sensation intact. Gait not checked.  PSYCHIATRIC: The patient is alert and oriented x 3.  SKIN: No obvious rash, lesion, or ulcer.  DATA REVIEW:   CBC Recent Labs  Lab 08/07/19 0525  WBC 7.5  HGB 9.3*  HCT 31.4*  PLT 264    Chemistries  Recent Labs  Lab 08/05/19 0931  08/07/19 0525  NA 131*   < > 132*  K 4.4   < > 4.8  CL 91*   < > 90*  CO2  30   < > 33*  GLUCOSE 147*   < > 238*  BUN 26*   < > 18  CREATININE 1.32*   < > 1.23  CALCIUM 8.5*   < > 8.4*  AST 14*  --   --   ALT 10  --   --   ALKPHOS 133*  --   --   BILITOT 0.4  --   --    < > = values in this interval not displayed.     Microbiology Results  Results for orders placed or performed during the hospital encounter of 08/04/19  SARS CORONAVIRUS 2 (TAT 6-24 HRS) Nasopharyngeal Nasopharyngeal Swab     Status: None   Collection Time: 08/04/19  7:27 AM   Specimen: Nasopharyngeal Swab  Result Value Ref Range Status   SARS Coronavirus 2 NEGATIVE NEGATIVE Final    Comment: (NOTE) SARS-CoV-2 target nucleic acids are NOT DETECTED. The SARS-CoV-2 RNA is generally detectable in upper and lower respiratory specimens during the acute phase of infection. Negative results do not preclude SARS-CoV-2 infection, do not rule out co-infections with other pathogens, and should not be used as the sole basis for treatment or other patient management decisions. Negative results must be combined with clinical observations, patient history, and epidemiological information. The expected result is Negative. Fact Sheet for Patients: SugarRoll.be Fact Sheet for Healthcare Providers: https://www.woods-mathews.com/ This test is not yet approved or cleared by the [place] FDA and  has been authorized for detection and/or diagnosis of SARS-CoV-2 by FDA under an Emergency Use Authorization (EUA). This EUA will remain  in effect (meaning this test can be used) for the duration of the COVID-19 declaration under Section 56 4(b)(1) of the Act, 21 U.S.C. section 360bbb-3(b)(1), unless the authorization is terminated or revoked sooner. Performed at [facility], [place] [address], [place],  [address]     RADIOLOGY:  Mr Knee Left Wo Contrast  Result Date: 08/06/2019 CLINICAL DATA:  Chronic knee pain. EXAM: MRI OF THE LEFT KNEE WITHOUT  CONTRAST TECHNIQUE: Multiplanar, multisequence MR imaging of the knee  was performed. No intravenous contrast was administered. COMPARISON:  Radiographs 07/30/2019 FINDINGS: Examination is significantly limited by patient motion. 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 MENISCI Medial meniscus: Inferior articular surface tears along the posterior horn midbody junction region. Lateral meniscus:  Grossly intact. LIGAMENTS Cruciates:  Intact Collaterals:  Intact CARTILAGE Patellofemoral: Limited evaluation. No obvious full-thickness cartilage defects. Medial: Very limited examination. No obvious full-thickness cartilage defects. Lateral: Limited examination. No obvious full-thickness cartilage defects. Joint:  Moderate to large joint effusion and mild synovitis. Popliteal Fossa:  Moderate-sized leaking Baker's cyst. Extensor Mechanism: The patella retinacular structures are intact and the quadriceps and patellar tendons are intact. Mild proximal and distal patellar tendinopathy. Bones: Moderate edema like signal changes involving the posterior aspect of the medial tibia. This could be a bone contusion, stress reaction or subtle subchondral stress fracture. Other: Grossly normal knee musculature. IMPRESSION: 1. Very limited examination due to patient motion. 2. Inferior articular surface tearing involving the posterior horn mid body junction region of the medial meniscus. 3. Intact ligamentous structures. 4. Suspect mild degenerative chondrosis for age but no gross cartilage defects or osteochondral lesion. 5. Edema like signal changes in the posterior aspect of the medial tibia could be a bone contusion, stress reaction or subtle subchondral stress fracture. 6. Moderate to large joint effusion and moderate-sized leaking Baker's cyst. Electronically Signed   By: Marijo Sanes M.D.   On: 08/06/2019 12:49     Management plans discussed with the patient, family and they are in agreement.  CODE STATUS: Full  Code   TOTAL TIME TAKING CARE OF THIS PATIENT: 45 minutes.    Berna Spare Jaquelyne Firkus M.D on 08/07/2019 at 10:48 AM  Between 7am to 6pm - Pager - [phone]  After 6pm go to [email or web address] - [employer] [facility]  Office  [phone]  CC: Primary care physician; [name], MD   Note: This dictation was prepared with Dragon dictation along with smaller phrase technology. Any transcriptional errors that result from this process are unintentional.

## 2019-08-07 NOTE — Progress Notes (Signed)
EMS notified of transport. Patient was complaining of nausea and treated with prn zofran with relief. Pt is agreeing to be transported at this time. I will continue to assess.

## 2019-08-08 ENCOUNTER — Ambulatory Visit: Payer: Medicare HMO | Admitting: Cardiovascular Disease

## 2019-08-13 ENCOUNTER — Encounter: Payer: Self-pay | Admitting: Intensive Care

## 2019-08-13 ENCOUNTER — Emergency Department: Payer: Medicare HMO

## 2019-08-13 ENCOUNTER — Inpatient Hospital Stay
Admission: EM | Admit: 2019-08-13 | Discharge: 2019-08-19 | DRG: 191 | Disposition: A | Discharge status: Skilled Nursing Facility | Payer: Medicare HMO | Attending: Internal Medicine | Admitting: Internal Medicine

## 2019-08-13 ENCOUNTER — Other Ambulatory Visit: Payer: Self-pay

## 2019-08-13 DIAGNOSIS — K449 Diaphragmatic hernia without obstruction or gangrene: Secondary | ICD-10-CM | POA: Diagnosis present

## 2019-08-13 DIAGNOSIS — M23309 Other meniscus derangements, unspecified meniscus, unspecified knee: Secondary | ICD-10-CM | POA: Diagnosis not present

## 2019-08-13 DIAGNOSIS — Z8249 Family history of ischemic heart disease and other diseases of the circulatory system: Secondary | ICD-10-CM

## 2019-08-13 DIAGNOSIS — J9610 Chronic respiratory failure, unspecified whether with hypoxia or hypercapnia: Secondary | ICD-10-CM | POA: Diagnosis present

## 2019-08-13 DIAGNOSIS — G4700 Insomnia, unspecified: Secondary | ICD-10-CM | POA: Diagnosis present

## 2019-08-13 DIAGNOSIS — Z9981 Dependence on supplemental oxygen: Secondary | ICD-10-CM | POA: Diagnosis not present

## 2019-08-13 DIAGNOSIS — K219 Gastro-esophageal reflux disease without esophagitis: Secondary | ICD-10-CM | POA: Diagnosis present

## 2019-08-13 DIAGNOSIS — Z79899 Other long term (current) drug therapy: Secondary | ICD-10-CM

## 2019-08-13 DIAGNOSIS — Z20828 Contact with and (suspected) exposure to other viral communicable diseases: Secondary | ICD-10-CM | POA: Diagnosis present

## 2019-08-13 DIAGNOSIS — F3162 Bipolar disorder, current episode mixed, moderate: Secondary | ICD-10-CM | POA: Diagnosis present

## 2019-08-13 DIAGNOSIS — R0602 Shortness of breath: Secondary | ICD-10-CM

## 2019-08-13 DIAGNOSIS — J439 Emphysema, unspecified: Principal | ICD-10-CM | POA: Diagnosis present

## 2019-08-13 DIAGNOSIS — Z951 Presence of aortocoronary bypass graft: Secondary | ICD-10-CM | POA: Diagnosis not present

## 2019-08-13 DIAGNOSIS — I252 Old myocardial infarction: Secondary | ICD-10-CM

## 2019-08-13 DIAGNOSIS — E875 Hyperkalemia: Secondary | ICD-10-CM | POA: Diagnosis present

## 2019-08-13 DIAGNOSIS — Z885 Allergy status to narcotic agent status: Secondary | ICD-10-CM

## 2019-08-13 DIAGNOSIS — Z87891 Personal history of nicotine dependence: Secondary | ICD-10-CM

## 2019-08-13 DIAGNOSIS — Z23 Encounter for immunization: Secondary | ICD-10-CM

## 2019-08-13 DIAGNOSIS — J441 Chronic obstructive pulmonary disease with (acute) exacerbation: Secondary | ICD-10-CM | POA: Diagnosis not present

## 2019-08-13 DIAGNOSIS — D509 Iron deficiency anemia, unspecified: Secondary | ICD-10-CM | POA: Diagnosis present

## 2019-08-13 DIAGNOSIS — E119 Type 2 diabetes mellitus without complications: Secondary | ICD-10-CM | POA: Diagnosis present

## 2019-08-13 DIAGNOSIS — Z888 Allergy status to other drugs, medicaments and biological substances status: Secondary | ICD-10-CM

## 2019-08-13 DIAGNOSIS — Z6835 Body mass index (BMI) 35.0-35.9, adult: Secondary | ICD-10-CM

## 2019-08-13 DIAGNOSIS — Z833 Family history of diabetes mellitus: Secondary | ICD-10-CM | POA: Diagnosis not present

## 2019-08-13 DIAGNOSIS — Z794 Long term (current) use of insulin: Secondary | ICD-10-CM

## 2019-08-13 DIAGNOSIS — Z825 Family history of asthma and other chronic lower respiratory diseases: Secondary | ICD-10-CM

## 2019-08-13 DIAGNOSIS — E785 Hyperlipidemia, unspecified: Secondary | ICD-10-CM | POA: Diagnosis present

## 2019-08-13 DIAGNOSIS — I130 Hypertensive heart and chronic kidney disease with heart failure and stage 1 through stage 4 chronic kidney disease, or unspecified chronic kidney disease: Secondary | ICD-10-CM | POA: Diagnosis present

## 2019-08-13 DIAGNOSIS — I2510 Atherosclerotic heart disease of native coronary artery without angina pectoris: Secondary | ICD-10-CM | POA: Diagnosis present

## 2019-08-13 DIAGNOSIS — K5790 Diverticulosis of intestine, part unspecified, without perforation or abscess without bleeding: Secondary | ICD-10-CM | POA: Diagnosis present

## 2019-08-13 DIAGNOSIS — E860 Dehydration: Secondary | ICD-10-CM | POA: Diagnosis present

## 2019-08-13 DIAGNOSIS — I5032 Chronic diastolic (congestive) heart failure: Secondary | ICD-10-CM | POA: Diagnosis present

## 2019-08-13 DIAGNOSIS — M25562 Pain in left knee: Secondary | ICD-10-CM

## 2019-08-13 DIAGNOSIS — Z880 Allergy status to penicillin: Secondary | ICD-10-CM

## 2019-08-13 DIAGNOSIS — F319 Bipolar disorder, unspecified: Secondary | ICD-10-CM | POA: Diagnosis not present

## 2019-08-13 DIAGNOSIS — Z881 Allergy status to other antibiotic agents status: Secondary | ICD-10-CM

## 2019-08-13 DIAGNOSIS — N183 Chronic kidney disease, stage 3 unspecified: Secondary | ICD-10-CM | POA: Diagnosis present

## 2019-08-13 LAB — URINALYSIS, COMPLETE (UACMP) WITH MICROSCOPIC
Bacteria, UA: NONE SEEN
Bilirubin Urine: NEGATIVE
Glucose, UA: NEGATIVE mg/dL
Hgb urine dipstick: NEGATIVE
Ketones, ur: NEGATIVE mg/dL
Leukocytes,Ua: NEGATIVE
Nitrite: NEGATIVE
Protein, ur: NEGATIVE mg/dL
Specific Gravity, Urine: 1.005 (ref 1.005–1.030)
pH: 7 (ref 5.0–8.0)

## 2019-08-13 LAB — BASIC METABOLIC PANEL
Anion gap: 10 (ref 5–15)
BUN: 17 mg/dL (ref 8–23)
CO2: 35 mmol/L — ABNORMAL HIGH (ref 22–32)
Calcium: 9 mg/dL (ref 8.9–10.3)
Chloride: 90 mmol/L — ABNORMAL LOW (ref 98–111)
Creatinine, Ser: 1.21 mg/dL (ref 0.61–1.24)
GFR calc Af Amer: >60 mL/min (ref >60)
GFR calc non Af Amer: 57 mL/min — ABNORMAL LOW (ref >60)
Glucose, Bld: 112 mg/dL — ABNORMAL HIGH (ref 70–99)
Potassium: 5.3 mmol/L — ABNORMAL HIGH (ref 3.5–5.1)
Sodium: 135 mmol/L (ref 135–145)

## 2019-08-13 LAB — CBC
HCT: 32.8 % — ABNORMAL LOW (ref 39.0–52.0)
Hemoglobin: 9.5 g/dL — ABNORMAL LOW (ref 13.0–17.0)
MCH: 26.2 pg (ref 26.0–34.0)
MCHC: 29 g/dL — ABNORMAL LOW (ref 30.0–36.0)
MCV: 90.6 fL (ref 80.0–100.0)
Platelets: 231 10*3/uL (ref 150–400)
RBC: 3.62 MIL/uL — ABNORMAL LOW (ref 4.22–5.81)
RDW: 16.8 % — ABNORMAL HIGH (ref 11.5–15.5)
WBC: 9.4 10*3/uL (ref 4.0–10.5)
nRBC: 0 % (ref 0.0–0.2)

## 2019-08-13 LAB — TROPONIN I (HIGH SENSITIVITY): Troponin I (High Sensitivity): 9 ng/L (ref <18)

## 2019-08-13 LAB — BRAIN NATRIURETIC PEPTIDE: B Natriuretic Peptide: 99 pg/mL (ref 0.0–100.0)

## 2019-08-13 LAB — SARS CORONAVIRUS 2 (TAT 6-24 HRS): SARS Coronavirus 2: NEGATIVE

## 2019-08-13 LAB — GLUCOSE, CAPILLARY: Glucose-Capillary: 280 mg/dL — ABNORMAL HIGH (ref 70–99)

## 2019-08-13 LAB — MAGNESIUM: Magnesium: 2 mg/dL (ref 1.7–2.4)

## 2019-08-13 MED ORDER — IPRATROPIUM-ALBUTEROL 0.5-2.5 (3) MG/3ML IN SOLN
3.0000 mL | Freq: Once | RESPIRATORY_TRACT | Status: AC
  Administered 2019-08-13: 3 mL via RESPIRATORY_TRACT
  Filled 2019-08-13: qty 3

## 2019-08-13 MED ORDER — CITALOPRAM HYDROBROMIDE 20 MG PO TABS
20.0000 mg | ORAL_TABLET | Freq: Every day | ORAL | Status: DC
  Administered 2019-08-13 – 2019-08-19 (×7): 20 mg via ORAL
  Filled 2019-08-13 (×7): qty 1

## 2019-08-13 MED ORDER — IPRATROPIUM-ALBUTEROL 0.5-2.5 (3) MG/3ML IN SOLN
3.0000 mL | Freq: Once | RESPIRATORY_TRACT | Status: AC
  Administered 2019-08-16: 3 mL via RESPIRATORY_TRACT

## 2019-08-13 MED ORDER — ACETAMINOPHEN 650 MG RE SUPP: 650.0000 mg | Freq: Four times a day (QID) | RECTAL | Status: DC | PRN

## 2019-08-13 MED ORDER — PREDNISONE 50 MG PO TABS
50.0000 mg | ORAL_TABLET | Freq: Every day | ORAL | Status: DC
  Administered 2019-08-14 – 2019-08-19 (×6): 50 mg via ORAL
  Filled 2019-08-13 (×6): qty 1

## 2019-08-13 MED ORDER — INSULIN ASPART 100 UNIT/ML ~~LOC~~ SOLN
25.0000 [IU] | Freq: Three times a day (TID) | SUBCUTANEOUS | Status: DC
  Administered 2019-08-14 – 2019-08-16 (×9): 25 [IU] via SUBCUTANEOUS
  Filled 2019-08-13 (×9): qty 1

## 2019-08-13 MED ORDER — ONDANSETRON HCL 4 MG/2ML IJ SOLN: 4.0000 mg | Freq: Four times a day (QID) | INTRAMUSCULAR | Status: DC | PRN

## 2019-08-13 MED ORDER — HYDROCODONE-ACETAMINOPHEN 5-325 MG PO TABS
1.0000 | ORAL_TABLET | Freq: Once | ORAL | Status: AC
  Administered 2019-08-13: 1 via ORAL
  Filled 2019-08-13: qty 1

## 2019-08-13 MED ORDER — ONDANSETRON HCL 4 MG PO TABS: 4.0000 mg | ORAL_TABLET | Freq: Four times a day (QID) | ORAL | Status: DC | PRN

## 2019-08-13 MED ORDER — SODIUM CHLORIDE 0.9% FLUSH: 3.0000 mL | INTRAVENOUS | Status: DC | PRN

## 2019-08-13 MED ORDER — ALBUTEROL SULFATE (2.5 MG/3ML) 0.083% IN NEBU
2.5000 mg | INHALATION_SOLUTION | RESPIRATORY_TRACT | Status: DC | PRN
  Filled 2019-08-13: qty 3

## 2019-08-13 MED ORDER — LISINOPRIL 5 MG PO TABS
2.5000 mg | ORAL_TABLET | Freq: Every day | ORAL | Status: DC
  Administered 2019-08-14: 2.5 mg via ORAL
  Filled 2019-08-13: qty 1

## 2019-08-13 MED ORDER — BENZONATATE 100 MG PO CAPS
100.0000 mg | ORAL_CAPSULE | Freq: Three times a day (TID) | ORAL | Status: DC | PRN
  Administered 2019-08-15 – 2019-08-16 (×2): 100 mg via ORAL
  Filled 2019-08-13 (×2): qty 1

## 2019-08-13 MED ORDER — INSULIN GLARGINE 100 UNIT/ML ~~LOC~~ SOLN
35.0000 [IU] | Freq: Two times a day (BID) | SUBCUTANEOUS | Status: DC
  Administered 2019-08-13 – 2019-08-19 (×12): 35 [IU] via SUBCUTANEOUS
  Filled 2019-08-13 (×13): qty 0.35

## 2019-08-13 MED ORDER — ALPRAZOLAM 0.25 MG PO TABS
0.2500 mg | ORAL_TABLET | Freq: Three times a day (TID) | ORAL | Status: DC | PRN
  Administered 2019-08-14 – 2019-08-18 (×5): 0.25 mg via ORAL
  Filled 2019-08-13 (×5): qty 1

## 2019-08-13 MED ORDER — GUAIFENESIN-DM 100-10 MG/5ML PO SYRP
5.0000 mL | ORAL_SOLUTION | ORAL | Status: DC | PRN
  Administered 2019-08-13 – 2019-08-18 (×4): 5 mL via ORAL
  Filled 2019-08-13 (×4): qty 5

## 2019-08-13 MED ORDER — POLYETHYLENE GLYCOL 3350 17 G PO PACK: 17.0000 g | PACK | Freq: Every day | ORAL | Status: DC | PRN

## 2019-08-13 MED ORDER — GABAPENTIN 100 MG PO CAPS
400.0000 mg | ORAL_CAPSULE | Freq: Four times a day (QID) | ORAL | Status: DC
  Administered 2019-08-13 – 2019-08-19 (×23): 400 mg via ORAL
  Filled 2019-08-13 (×23): qty 4

## 2019-08-13 MED ORDER — BISACODYL 5 MG PO TBEC
5.0000 mg | DELAYED_RELEASE_TABLET | Freq: Every day | ORAL | Status: DC | PRN
  Administered 2019-08-19: 5 mg via ORAL
  Filled 2019-08-13 (×2): qty 1

## 2019-08-13 MED ORDER — ALBUTEROL SULFATE (2.5 MG/3ML) 0.083% IN NEBU
2.5000 mg | INHALATION_SOLUTION | Freq: Four times a day (QID) | RESPIRATORY_TRACT | Status: DC
  Administered 2019-08-13 – 2019-08-15 (×7): 2.5 mg via RESPIRATORY_TRACT
  Filled 2019-08-13 (×6): qty 3

## 2019-08-13 MED ORDER — INSULIN ASPART 100 UNIT/ML ~~LOC~~ SOLN
0.0000 [IU] | Freq: Three times a day (TID) | SUBCUTANEOUS | Status: DC
  Administered 2019-08-14: 3 [IU] via SUBCUTANEOUS
  Administered 2019-08-14: 5 [IU] via SUBCUTANEOUS
  Administered 2019-08-14: 2 [IU] via SUBCUTANEOUS
  Administered 2019-08-15: 1 [IU] via SUBCUTANEOUS
  Administered 2019-08-15: 2 [IU] via SUBCUTANEOUS
  Administered 2019-08-15: 3 [IU] via SUBCUTANEOUS
  Administered 2019-08-16: 2 [IU] via SUBCUTANEOUS
  Administered 2019-08-16: 5 [IU] via SUBCUTANEOUS
  Administered 2019-08-17: 1 [IU] via SUBCUTANEOUS
  Administered 2019-08-17 – 2019-08-18 (×2): 5 [IU] via SUBCUTANEOUS
  Administered 2019-08-18: 2 [IU] via SUBCUTANEOUS
  Administered 2019-08-18: 1 [IU] via SUBCUTANEOUS
  Administered 2019-08-19: 2 [IU] via SUBCUTANEOUS
  Filled 2019-08-13 (×14): qty 1

## 2019-08-13 MED ORDER — ACETAMINOPHEN 325 MG PO TABS: 650.0000 mg | ORAL_TABLET | Freq: Four times a day (QID) | ORAL | Status: DC | PRN

## 2019-08-13 MED ORDER — SODIUM CHLORIDE 0.9% FLUSH
10.0000 mL | Freq: Two times a day (BID) | INTRAVENOUS | Status: DC
  Administered 2019-08-14 – 2019-08-19 (×5): 10 mL

## 2019-08-13 MED ORDER — SODIUM CHLORIDE 0.9% FLUSH
10.0000 mL | INTRAVENOUS | Status: DC | PRN
  Administered 2019-08-15: 10 mL
  Filled 2019-08-13: qty 40

## 2019-08-13 MED ORDER — MELATONIN 5 MG PO TABS
5.0000 mg | ORAL_TABLET | Freq: Every day | ORAL | Status: DC | PRN
  Administered 2019-08-13 – 2019-08-18 (×5): 5 mg via ORAL
  Filled 2019-08-13 (×6): qty 1

## 2019-08-13 MED ORDER — INSULIN ASPART 100 UNIT/ML ~~LOC~~ SOLN
0.0000 [IU] | Freq: Every day | SUBCUTANEOUS | Status: DC
  Administered 2019-08-13: 3 [IU] via SUBCUTANEOUS
  Administered 2019-08-15 – 2019-08-16 (×2): 2 [IU] via SUBCUTANEOUS
  Administered 2019-08-17: 4 [IU] via SUBCUTANEOUS
  Administered 2019-08-18: 3 [IU] via SUBCUTANEOUS
  Filled 2019-08-13 (×5): qty 1

## 2019-08-13 MED ORDER — HYDROCODONE-ACETAMINOPHEN 5-325 MG PO TABS
1.0000 | ORAL_TABLET | ORAL | Status: DC | PRN
  Administered 2019-08-13 – 2019-08-19 (×8): 1 via ORAL
  Filled 2019-08-13 (×8): qty 1

## 2019-08-13 MED ORDER — PREDNISONE 20 MG PO TABS
60.0000 mg | ORAL_TABLET | Freq: Once | ORAL | Status: AC
  Administered 2019-08-13: 60 mg via ORAL
  Filled 2019-08-13: qty 3

## 2019-08-13 MED ORDER — SENNOSIDES-DOCUSATE SODIUM 8.6-50 MG PO TABS: 1.0000 | ORAL_TABLET | Freq: Every evening | ORAL | Status: DC | PRN

## 2019-08-13 MED ORDER — SODIUM CHLORIDE 0.9 % IV SOLN: 250.0000 mL | INTRAVENOUS | Status: DC | PRN

## 2019-08-13 MED ORDER — SODIUM CHLORIDE 0.9% FLUSH
3.0000 mL | Freq: Two times a day (BID) | INTRAVENOUS | Status: DC
  Administered 2019-08-13 – 2019-08-19 (×12): 3 mL via INTRAVENOUS

## 2019-08-13 MED ORDER — PANTOPRAZOLE SODIUM 40 MG PO TBEC
40.0000 mg | DELAYED_RELEASE_TABLET | Freq: Two times a day (BID) | ORAL | Status: DC
  Administered 2019-08-13 – 2019-08-19 (×12): 40 mg via ORAL
  Filled 2019-08-13 (×12): qty 1

## 2019-08-13 MED ORDER — IOHEXOL 350 MG/ML SOLN
75.0000 mL | Freq: Once | INTRAVENOUS | Status: AC | PRN
  Administered 2019-08-13: 75 mL via INTRAVENOUS

## 2019-08-13 MED ORDER — ENOXAPARIN SODIUM 40 MG/0.4ML ~~LOC~~ SOLN
40.0000 mg | SUBCUTANEOUS | Status: DC
  Administered 2019-08-13 – 2019-08-18 (×6): 40 mg via SUBCUTANEOUS
  Filled 2019-08-13 (×6): qty 0.4

## 2019-08-13 MED ORDER — TIOTROPIUM BROMIDE MONOHYDRATE 18 MCG IN CAPS
18.0000 ug | ORAL_CAPSULE | Freq: Every day | RESPIRATORY_TRACT | Status: DC
  Administered 2019-08-14 – 2019-08-19 (×6): 18 ug via RESPIRATORY_TRACT
  Filled 2019-08-13: qty 5

## 2019-08-13 MED ORDER — ATORVASTATIN CALCIUM 20 MG PO TABS
40.0000 mg | ORAL_TABLET | Freq: Every day | ORAL | Status: DC
  Administered 2019-08-14 – 2019-08-18 (×5): 40 mg via ORAL
  Filled 2019-08-13 (×5): qty 2

## 2019-08-13 NOTE — ED Notes (Signed)
Patient back from CT. CT made this RN and MD Isaacs aware IV infiltrated. IV taken out

## 2019-08-13 NOTE — ED Notes (Addendum)
Patients condom cath leaking. Patients bedding changed, cleaned and patient placed in gown. Patient is dependent for ADL. Patient placed in brief and pulled up in bed. Patient given warm blankets and when placed on patient he stated "this is better than sex. Or I have always thought so" patient reports room is freezing and would like thermostat to be placed on 80 degrees. Thermostat placed to 73 degrees

## 2019-08-13 NOTE — ED Notes (Signed)
Patient given sandwich tray. 
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
## 2019-08-13 NOTE — ED Notes (Signed)
When asked patient if he could turn his arm with IV towards me to flush IV, patient said "should I turn it towards your breasts"

## 2019-08-13 NOTE — ED Notes (Signed)
Lab contacted to send Phlebotomist for blood draw. Multiple nurses attempted and unable to get blood

## 2019-08-13 NOTE — ED Notes (Signed)
Pt informed of admission and bed status. Pt reported hunger, but denied a meal tray or snacks on department. Pt was given a meal tray previously at approx. [place]

## 2019-08-13 NOTE — ED Notes (Signed)
Patient verbally aggressive to this RN and demanding multiple things at once. Patient made aware that I can help him with one thing at a time. Patients bed moved to bedside commode and helped to toilet. Patient provided with oxygen tank to wear oxygen while on toilet.
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
## 2019-08-13 NOTE — ED Notes (Signed)
Pt moaning and complaining of generalized pain. Pt reports "I need some lotion put on my back, last time I became so itchy I started hollering," but denied any itchiness present. Pt reports dry skin on back.

## 2019-08-13 NOTE — ED Triage Notes (Signed)
Arrived by [facility] from home. Wears 5L O2 continuous. Patient c/o intermittent chest pain, weakness, and generalized pain.

## 2019-08-13 NOTE — ED Notes (Signed)
Urinal being held at this time for patient to urinate. Patient given dinner tray

## 2019-08-13 NOTE — Progress Notes (Signed)
PT Cancellation Note  Patient Details Name: [name] MRN: [id number] DOB: 09/05/1941   Cancelled Treatment:    Reason Eval/Treat Not Completed: Medical issues which prohibited therapy(Consult received and chart reviewed. Patient noted with order for chest CT to rule out PE; contraindicated for exertional activity until test complete and results received.  Will continue to follow and initiate as medically appropriate.)   [name] H. Owens Shark, PT, DPT, NCS 08/13/19, 2:03 PM [phone]
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
## 2019-08-13 NOTE — ED Notes (Signed)
Meal finished and drinks removed

## 2019-08-13 NOTE — ED Notes (Signed)
Patient c/o room being too hot. PAtient would like thermostat lowered. Thermostat put on 70 degrees. Patient requesting gingerale

## 2019-08-13 NOTE — ED Notes (Signed)
Upon entering the room, RN noticed that whoever disconnected nebulizer treatment did not reconnect nasal cannula O2. Sats noted to be at about 85-86%. Reconnected to O2 at 5L/min via nasal cannula and observed. SpO2 returned to 93%, Ellender Hose, MD notified. Pt alert and oriented x4 with good color and breathing at 18 r/min.

## 2019-08-13 NOTE — ED Notes (Signed)
Pt angry asking when dinner is coming and why is he still in the ER and not in a rm  This tech explained to pt that we have several pt's waiting for a rm and that he will get upstairs once a rm becomes available. Pt still angry and shouting "well I have been waiting for a very long time and I should be in a rm already." This tech assured pt that I will check on his meal tray and talk to RN. Noah,RN aware of pt behavior and concerns and stated that pt has ate already.

## 2019-08-13 NOTE — ED Notes (Signed)
When triage nurse Amber stepped out of room to get condom cath, patient began talking about his "small dick" and gesturing toward penis and talking about how nothing will stay on it and he needs help with everything when he urinates. Upon [employer] returning to room, patient stopped conversation with this RN and condom cath was placed without difficulty.

## 2019-08-14 ENCOUNTER — Inpatient Hospital Stay: Payer: Medicare HMO

## 2019-08-14 LAB — BASIC METABOLIC PANEL
Anion gap: 11 (ref 5–15)
BUN: 24 mg/dL — ABNORMAL HIGH (ref 8–23)
CO2: 32 mmol/L (ref 22–32)
Calcium: 9 mg/dL (ref 8.9–10.3)
Chloride: 88 mmol/L — ABNORMAL LOW (ref 98–111)
Creatinine, Ser: 1.19 mg/dL (ref 0.61–1.24)
GFR calc Af Amer: >60 mL/min (ref >60)
GFR calc non Af Amer: 59 mL/min — ABNORMAL LOW (ref >60)
Glucose, Bld: 257 mg/dL — ABNORMAL HIGH (ref 70–99)
Potassium: 5.6 mmol/L — ABNORMAL HIGH (ref 3.5–5.1)
Sodium: 131 mmol/L — ABNORMAL LOW (ref 135–145)

## 2019-08-14 LAB — CBC
HCT: 29.9 % — ABNORMAL LOW (ref 39.0–52.0)
Hemoglobin: 9 g/dL — ABNORMAL LOW (ref 13.0–17.0)
MCH: 26.5 pg (ref 26.0–34.0)
MCHC: 30.1 g/dL (ref 30.0–36.0)
MCV: 87.9 fL (ref 80.0–100.0)
Platelets: 222 10*3/uL (ref 150–400)
RBC: 3.4 MIL/uL — ABNORMAL LOW (ref 4.22–5.81)
RDW: 16.4 % — ABNORMAL HIGH (ref 11.5–15.5)
WBC: 6.8 10*3/uL (ref 4.0–10.5)
nRBC: 0 % (ref 0.0–0.2)

## 2019-08-14 LAB — GLUCOSE, CAPILLARY
Glucose-Capillary: 191 mg/dL — ABNORMAL HIGH (ref 70–99)
Glucose-Capillary: 273 mg/dL — ABNORMAL HIGH (ref 70–99)
Glucose-Capillary: 209 mg/dL — ABNORMAL HIGH (ref 70–99)
Glucose-Capillary: 189 mg/dL — ABNORMAL HIGH (ref 70–99)

## 2019-08-14 MED ORDER — SODIUM ZIRCONIUM CYCLOSILICATE 10 G PO PACK
10.0000 g | PACK | Freq: Once | ORAL | Status: AC
  Administered 2019-08-14: 10 g via ORAL
  Filled 2019-08-14: qty 1

## 2019-08-14 MED ORDER — INFLUENZA VAC A&B SA ADJ QUAD 0.5 ML IM PRSY
0.5000 mL | PREFILLED_SYRINGE | INTRAMUSCULAR | Status: AC
  Administered 2019-08-16: 0.5 mL via INTRAMUSCULAR
  Filled 2019-08-14: qty 0.5

## 2019-08-14 MED ORDER — PNEUMOCOCCAL VAC POLYVALENT 25 MCG/0.5ML IJ INJ
0.5000 mL | INJECTION | INTRAMUSCULAR | Status: AC
  Administered 2019-08-16: 0.5 mL via INTRAMUSCULAR
  Filled 2019-08-14: qty 0.5

## 2019-08-14 NOTE — Progress Notes (Signed)
Called ortho tech and order patient's hinge knee brace, per tech will deliver tomorrow.

## 2019-08-14 NOTE — Progress Notes (Signed)
[facility] NAME: [name]    MR#:  [id number]  DATE OF BIRTH:  1941/01/07  SUBJECTIVE:    REVIEW OF SYSTEMS:   ROS Tolerating Diet: Tolerating PT:   DRUG ALLERGIES:   Allergies  Allergen Reactions  . Morphine And Related Shortness Of Breath, Nausea And Vomiting, Rash and Other (See Comments)    Reaction:  Hallucinations   . Penicillins Anaphylaxis, Hives and Other (See Comments)    10/02/18 - discussed with patient and he states he can tolerate penicillin capsules and had some hives when he was 78yo and received pcn injections  Has patient had a PCN reaction causing immediate rash, facial/tongue/throat swelling, SOB or lightheadedness with hypotension: Yes Has patient had a PCN reaction causing severe rash involving mucus membranes or skin necrosis: No Has patient had a PCN reaction that required hospitalization No Has patient had a PCN reaction occurring within the last 10 y  . Zolpidem Shortness Of Breath  . Demerol [Meperidine] Other (See Comments)    Reaction:  Hallucinations    . Dilaudid [Hydromorphone Hcl] Other (See Comments)    Reaction:  Hallucinations   . Levofloxacin Other (See Comments)    Reaction:  Unknown     VITALS:  Blood pressure (!) 157/73, pulse 95, temperature 97.8 F (36.6 C), temperature source Oral, resp. rate 19, height 6\' 3"  (1.905 m), weight 127.1 kg, SpO2 93 %.  PHYSICAL EXAMINATION:   Physical Exam  GENERAL:  78 y.o.-year-old patient lying in the bed with no acute distress.  EYES: Pupils equal, round, reactive to light and accommodation. No scleral icterus. Extraocular muscles intact.  HEENT: Head atraumatic, normocephalic. Oropharynx and nasopharynx clear.  NECK:  Supple, no jugular venous distention. No thyroid enlargement, no tenderness.  LUNGS: Normal breath sounds bilaterally, no wheezing, rales, rhonchi. No use of accessory muscles of respiration.  CARDIOVASCULAR:  S1, S2 normal. No murmurs, rubs, or gallops.  ABDOMEN: Soft, nontender, nondistended. Bowel sounds present. No organomegaly or mass.  EXTREMITIES: No cyanosis, clubbing or edema b/l.    NEUROLOGIC: Cranial nerves II through XII are intact. No focal Motor or sensory deficits b/l.   PSYCHIATRIC:  patient is alert and oriented x 3.  SKIN: No obvious rash, lesion, or ulcer.   LABORATORY PANEL:  CBC Recent Labs  Lab 08/14/19 0336  WBC 6.8  HGB 9.0*  HCT 29.9*  PLT 222    Chemistries  Recent Labs  Lab 08/13/19 1010  08/14/19 0336  NA  --    < > 131*  K  --    < > 5.6*  CL  --    < > 88*  CO2  --    < > 32  GLUCOSE  --    < > 257*  BUN  --    < > 24*  CREATININE  --    < > 1.19  CALCIUM  --    < > 9.0  MG 2.0  --   --    < > = values in this interval not displayed.   Cardiac Enzymes No results for input(s): TROPONINI in the last 168 hours. RADIOLOGY:  US Venous Img Lower Bilateral  Result Date: 08/13/2019 CLINICAL DATA:  78 year old male with chest pain and leg swelling EXAM: BILATERAL LOWER EXTREMITY VENOUS DOPPLER ULTRASOUND TECHNIQUE: Gray-scale sonography with graded compression, as well as color Doppler and duplex ultrasound were performed to evaluate the lower extremity deep venous systems from  the level of the common femoral vein and including the common femoral, femoral, profunda femoral, popliteal and calf veins including the posterior tibial, peroneal and gastrocnemius veins when visible. The superficial great saphenous vein was also interrogated. Spectral Doppler was utilized to evaluate flow at rest and with distal augmentation maneuvers in the common femoral, femoral and popliteal veins. COMPARISON:  None. FINDINGS: RIGHT LOWER EXTREMITY Common Femoral Vein: No evidence of thrombus. Normal compressibility, respiratory phasicity and response to augmentation. Saphenofemoral Junction: No evidence of thrombus. Normal compressibility and flow on color Doppler imaging.  Profunda Femoral Vein: No evidence of thrombus. Normal compressibility and flow on color Doppler imaging. Femoral Vein: No evidence of thrombus. Normal compressibility, respiratory phasicity and response to augmentation. Popliteal Vein: No evidence of thrombus. Normal compressibility, respiratory phasicity and response to augmentation. Calf Veins: No evidence of thrombus. Normal compressibility and flow on color Doppler imaging. Superficial Great Saphenous Vein: No evidence of thrombus. Normal compressibility and flow on color Doppler imaging. Other Findings:  None. LEFT LOWER EXTREMITY Common Femoral Vein: No evidence of thrombus. Normal compressibility, respiratory phasicity and response to augmentation. Saphenofemoral Junction: No evidence of thrombus. Normal compressibility and flow on color Doppler imaging. Profunda Femoral Vein: No evidence of thrombus. Normal compressibility and flow on color Doppler imaging. Femoral Vein: No evidence of thrombus. Normal compressibility, respiratory phasicity and response to augmentation. Popliteal Vein: No evidence of thrombus. Normal compressibility, respiratory phasicity and response to augmentation. Calf Veins: No evidence of thrombus. Normal compressibility and flow on color Doppler imaging. Superficial Great Saphenous Vein: No evidence of thrombus. Normal compressibility and flow on color Doppler imaging. Other Findings:  None. IMPRESSION: Sonographic survey of the bilateral lower extremities negative for DVT Electronically Signed   By: Corrie Mckusick D.O.   On: 08/13/2019 11:16   Dg Chest Portable 1 View  Result Date: 08/13/2019 CLINICAL DATA:  Chest pain EXAM: PORTABLE CHEST 1 VIEW COMPARISON:  08/04/2019 FINDINGS: Left basilar airspace opacity. Suspect small left effusion. No confluent opacity on the right. Heart is mildly enlarged. Prior CABG. [place] again noted in the left upper lobe/apex. IMPRESSION: Left lower lobe atelectasis or infiltrate with small left  effusion. Findings similar to prior study. Electronically Signed   By: Rolm Baptise M.D.   On: 08/13/2019 10:18   Dg Knee Complete 4 Views Left  Result Date: 08/14/2019 CLINICAL DATA:  Left knee pain. EXAM: LEFT KNEE - COMPLETE 4+ VIEW COMPARISON:  Left knee MRI 08/06/2019 and radiographs 07/30/2019 FINDINGS: A small to moderate-sized knee joint effusion is evident, not apparent on the prior radiographs although a joint effusion was seen on the interval MRI. No acute fracture or dislocation is identified. Joint space widths are preserved. There is mild diffuse subcutaneous edema. IMPRESSION: Knee joint effusion without acute osseous abnormality identified. Electronically Signed   By: Logan Bores M.D.   On: 08/14/2019 11:23   Ct No Charge  Result Date: 08/14/2019 CLINICAL DATA:  Inpatient.  Dyspnea. EXAM: CT ADDITIONAL VIEWS AT NO CHARGE TECHNIQUE: Chest CT angiogram attempted. Reported 50 cc Omnipaque 350 left forearm extravasation. CONTRAST:  50 cc Omnipaque 350 IV. COMPARISON:  None. FINDINGS: Scout topogram demonstrates intact sternotomy wires and mild cardiomegaly. Axial contrast monitoring images through the great vessels demonstrate atherosclerotic aneurysmal ascending thoracic aorta with 4.5 cm diameter. IMPRESSION: 1. Reported 50 cc Omnipaque 350 left forearm extravasation. Unable to re-establish peripheral IV access. Patient returned to the inpatient floor. No diagnostic chest CT angiogram images were obtained. 2. Mild cardiomegaly visualized on  the CT scout topogram. 3. 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 Ascending thoracic aortic aneurysm. Recommend semi-annual imaging followup by CTA or MRA and referral to cardiothoracic surgery if not already obtained. This recommendation follows 2010 ACCF/AHA/AATS/ACR/ASA/SCA/SCAI/SIR/STS/SVM Guidelines for the Diagnosis and Management of Patients With Thoracic Aortic  Disease. Circulation. 2010; [id number]. Aortic aneurysm NOS (ICD10-I71.9). Electronically Signed   By: Ilona Sorrel M.D.   On: 08/14/2019 09:48   ASSESSMENT AND PLAN:  Axil is a 78 year old male presented to the ED with generalized weakness and ongoing left knee pain swelling.  1. Acute on chronic COPD exacerbation -patient uses chronic home oxygen. Continue nebulizer, PO prednisone -chest x-ray no pneumonia. No indication for antibiotic -incentive spirometer  2. Ongoing left knee pain and swelling -had MRI of the left knee during his last admission on October 20 showed effusion with inferior meniscal tear. Home health physical therapy was recommended. Patient tells me he is not able to bear any weight on that leg. -X-ray of the left knee joint shows effusion. -Orthopedic Dr. Harlow Mares consulted. Haiku chat message sent -PRN pain meds  3. Chronic diastolic heart failure with chronic lower extremity edema does not appear to be acutely volume overloaded -continue home dose of Lasix  4. Type II diabetes continue home meds  5. Chronic normocytic anemia no active bleeding stable  6. Hyperkalemia hold lisinopril for today. Will give a dose of lokelma  Case discussed with Care Management/Social Worker.  CODE STATUS: full  DVT Prophylaxis: lovenonx  TOTAL TIME TAKING CARE OF THIS PATIENT: *30* minutes.  >50% time spent on counselling and coordination of care  POSSIBLE D/C IN few DAYS, DEPENDING ON CLINICAL CONDITION.  Note: This dictation was prepared with Dragon dictation along with smaller phrase technology. Any transcriptional errors that result from this process are unintentional.  Fritzi Mandes M.D on 08/14/2019 at 2:08 PM  Between 7am to 6pm - Pager - [phone]  After 6pm go to [email or web address] - password [email or web address] [facility]  Office  [phone]  CC: Primary care physician; [name], MDPatient ID: [name], male   DOB: 1941/03/20, 78 y.o.    MRN: [id number]
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
## 2019-08-14 NOTE — Progress Notes (Signed)
PT Cancellation Note  Patient Details Name: SHAYQUAN NEWSTROM MRN: [id number] DOB: January 18, 1941   Cancelled Treatment:    Reason Eval/Treat Not Completed: Medical issues which prohibited therapy(Patient still awaiting chest CT to rule out PE. Will continue hold and initiate care as medically appropriate.)   Jamill Wetmore H. Owens Shark, PT, DPT, NCS 08/14/19, 9:11 AM [phone]

## 2019-08-15 LAB — BASIC METABOLIC PANEL
Anion gap: 12 (ref 5–15)
BUN: 28 mg/dL — ABNORMAL HIGH (ref 8–23)
CO2: 32 mmol/L (ref 22–32)
Calcium: 9.1 mg/dL (ref 8.9–10.3)
Chloride: 87 mmol/L — ABNORMAL LOW (ref 98–111)
Creatinine, Ser: 1.2 mg/dL (ref 0.61–1.24)
GFR calc Af Amer: >60 mL/min (ref >60)
GFR calc non Af Amer: 58 mL/min — ABNORMAL LOW (ref >60)
Glucose, Bld: 142 mg/dL — ABNORMAL HIGH (ref 70–99)
Potassium: 4.5 mmol/L (ref 3.5–5.1)
Sodium: 131 mmol/L — ABNORMAL LOW (ref 135–145)

## 2019-08-15 LAB — GLUCOSE, CAPILLARY
Glucose-Capillary: 196 mg/dL — ABNORMAL HIGH (ref 70–99)
Glucose-Capillary: 148 mg/dL — ABNORMAL HIGH (ref 70–99)
Glucose-Capillary: 246 mg/dL — ABNORMAL HIGH (ref 70–99)
Glucose-Capillary: 232 mg/dL — ABNORMAL HIGH (ref 70–99)

## 2019-08-15 LAB — IRON AND TIBC
Iron: 24 ug/dL — ABNORMAL LOW (ref 45–182)
Saturation Ratios: 6 % — ABNORMAL LOW (ref 17.9–39.5)
TIBC: 433 ug/dL (ref 250–450)
UIBC: 409 ug/dL

## 2019-08-15 LAB — RETICULOCYTES
Immature Retic Fract: 27.5 % — ABNORMAL HIGH (ref 2.3–15.9)
RBC.: 3.56 MIL/uL — ABNORMAL LOW (ref 4.22–5.81)
Retic Count, Absolute: 150.6 10*3/uL (ref 19.0–186.0)
Retic Ct Pct: 4.2 % — ABNORMAL HIGH (ref 0.4–3.1)

## 2019-08-15 LAB — CBC
HCT: 29.5 % — ABNORMAL LOW (ref 39.0–52.0)
Hemoglobin: 8.9 g/dL — ABNORMAL LOW (ref 13.0–17.0)
MCH: 26.6 pg (ref 26.0–34.0)
MCHC: 30.2 g/dL (ref 30.0–36.0)
MCV: 88.1 fL (ref 80.0–100.0)
Platelets: 227 10*3/uL (ref 150–400)
RBC: 3.35 MIL/uL — ABNORMAL LOW (ref 4.22–5.81)
RDW: 16.5 % — ABNORMAL HIGH (ref 11.5–15.5)
WBC: 8.6 10*3/uL (ref 4.0–10.5)
nRBC: 0 % (ref 0.0–0.2)

## 2019-08-15 LAB — FERRITIN: Ferritin: 25 ng/mL (ref 24–336)

## 2019-08-15 LAB — VITAMIN B12: Vitamin B-12: 534 pg/mL (ref 180–914)

## 2019-08-15 LAB — FOLATE: Folate: 8 ng/mL (ref >5.9)

## 2019-08-15 MED ORDER — LISINOPRIL 5 MG PO TABS
2.5000 mg | ORAL_TABLET | Freq: Every day | ORAL | Status: DC
  Administered 2019-08-15 – 2019-08-19 (×5): 2.5 mg via ORAL
  Filled 2019-08-15 (×5): qty 1

## 2019-08-15 MED ORDER — ALBUTEROL SULFATE (2.5 MG/3ML) 0.083% IN NEBU
2.5000 mg | INHALATION_SOLUTION | Freq: Three times a day (TID) | RESPIRATORY_TRACT | Status: DC
  Administered 2019-08-15 – 2019-08-16 (×2): 2.5 mg via RESPIRATORY_TRACT
  Filled 2019-08-15 (×2): qty 3

## 2019-08-15 NOTE — Progress Notes (Signed)
Please note, patient is currently followed by [facility] community Palliative program at home. CSW Evette Cristal made aware.  Flo Shanks BSN, RN, [place] [phone]

## 2019-08-15 NOTE — Progress Notes (Addendum)
[facility] NAME: Terry Vandale    MR#:  [id number]  DATE OF BIRTH:  February 05, 1941  SUBJECTIVE:   Patient is resting comfortably reports left knee pain and concerned about his hemoglobin drop from 9.5 at the time of admission to 8.9.  Denies any melena.  Had a bowel movement today which was yellow in color.  Patient endorses that his hemoglobin should stay at 11 which is his normal and concerned about his low hemoglobin ,admits chronically he is on 5 L of home oxygen.  Reports chronic cough and wheezing from his underlying emphysema  REVIEW OF SYSTEMS:   Review of Systems  Constitutional: Negative for fever and weight loss.  HENT: Negative for ear pain, hearing loss, sore throat and tinnitus.   Eyes: Negative for blurred vision and double vision.  Respiratory: Positive for cough and wheezing. Negative for hemoptysis and sputum production.   Cardiovascular: Negative for chest pain and palpitations.  Gastrointestinal: Negative for blood in stool, diarrhea, melena, nausea and vomiting.  Genitourinary: Negative for frequency and urgency.  Neurological: Negative for dizziness, tingling, tremors, focal weakness, weakness and headaches.  Psychiatric/Behavioral: The patient is not nervous/anxious and does not have insomnia.    Tolerating Diet: Tolerating PT:   DRUG ALLERGIES:   Allergies  Allergen Reactions  . Morphine And Related Shortness Of Breath, Nausea And Vomiting, Rash and Other (See Comments)    Reaction:  Hallucinations   . Penicillins Anaphylaxis, Hives and Other (See Comments)    10/02/18 - discussed with patient and he states he can tolerate penicillin capsules and had some hives when he was 78yo and received pcn injections  Has patient had a PCN reaction causing immediate rash, facial/tongue/throat swelling, SOB or lightheadedness with hypotension: Yes Has patient had a PCN reaction causing severe rash involving mucus  membranes or skin necrosis: No Has patient had a PCN reaction that required hospitalization No Has patient had a PCN reaction occurring within the last 10 y  . Zolpidem Shortness Of Breath  . Demerol [Meperidine] Other (See Comments)    Reaction:  Hallucinations    . Dilaudid [Hydromorphone Hcl] Other (See Comments)    Reaction:  Hallucinations   . Levofloxacin Other (See Comments)    Reaction:  Unknown     VITALS:  Blood pressure 140/64, pulse 85, temperature (!) 97.5 F (36.4 C), temperature source Oral, resp. rate 19, height 6\' 3"  (1.905 m), weight 127.1 kg, SpO2 96 %.  PHYSICAL EXAMINATION:   Physical Exam  GENERAL:  78 y.o.-year-old patient lying in the bed with no acute distress.  EYES: Pupils equal, round, reactive to light and accommodation. No scleral icterus. Extraocular muscles intact.  HEENT: Head atraumatic, normocephalic. Oropharynx and nasopharynx clear.  NECK:  Supple, no jugular venous distention. No thyroid enlargement, no tenderness.  LUNGS: Normal breath sounds bilaterally, no wheezing, rales, rhonchi. No use of accessory muscles of respiration.  CARDIOVASCULAR: S1, S2 normal. No murmurs, rubs, or gallops.  ABDOMEN: Soft, nontender, nondistended. Bowel sounds present.  EXTREMITIES: Left knee joint is tender in brace, no cyanosis, clubbing or edema b/l.    NEUROLOGIC: Cranial nerves II through XII are intact. No focal Motor or sensory deficits b/l.   PSYCHIATRIC:  patient is alert and oriented x 3.  SKIN: No obvious rash, lesion, or ulcer.   LABORATORY PANEL:  CBC Recent Labs  Lab 08/15/19 0536  WBC 8.6  HGB 8.9*  HCT 29.5*  PLT 227  Chemistries  Recent Labs  Lab 08/13/19 1010  08/15/19 0536  NA  --    < > 131*  K  --    < > 4.5  CL  --    < > 87*  CO2  --    < > 32  GLUCOSE  --    < > 142*  BUN  --    < > 28*  CREATININE  --    < > 1.20  CALCIUM  --    < > 9.1  MG 2.0  --   --    < > = values in this interval not displayed.   Cardiac  Enzymes No results for input(s): TROPONINI in the last 168 hours. RADIOLOGY:  Dg Knee Complete 4 Views Left  Result Date: 08/14/2019 CLINICAL DATA:  Left knee pain. EXAM: LEFT KNEE - COMPLETE 4+ VIEW COMPARISON:  Left knee MRI 08/06/2019 and radiographs 07/30/2019 FINDINGS: A small to moderate-sized knee joint effusion is evident, not apparent on the prior radiographs although a joint effusion was seen on the interval MRI. No acute fracture or dislocation is identified. Joint space widths are preserved. There is mild diffuse subcutaneous edema. IMPRESSION: Knee joint effusion without acute osseous abnormality identified. Electronically Signed   By: Logan Bores M.D.   On: 08/14/2019 11:23   Ct No Charge  Result Date: 08/14/2019 CLINICAL DATA:  Inpatient.  Dyspnea. EXAM: CT ADDITIONAL VIEWS AT NO CHARGE TECHNIQUE: Chest CT angiogram attempted. Reported 50 cc Omnipaque 350 left forearm extravasation. CONTRAST:  50 cc Omnipaque 350 IV. COMPARISON:  None. FINDINGS: Scout topogram demonstrates intact sternotomy wires and mild cardiomegaly. Axial contrast monitoring images through the great vessels demonstrate atherosclerotic aneurysmal ascending thoracic aorta with 4.5 cm diameter. IMPRESSION: 1. Reported 50 cc Omnipaque 350 left forearm extravasation. Unable to re-establish peripheral IV access. Patient returned to the inpatient floor. No diagnostic chest CT angiogram images were obtained. 2. Mild cardiomegaly visualized on the CT scout topogram. 3. 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 Ascending thoracic aortic aneurysm. Recommend semi-annual imaging followup by CTA or MRA and referral to cardiothoracic surgery if not already obtained. This recommendation follows 2010 ACCF/AHA/AATS/ACR/ASA/SCA/SCAI/SIR/STS/SVM Guidelines for the Diagnosis and Management of Patients With Thoracic Aortic Disease. Circulation. 2010; 121IM:6036419. Aortic aneurysm NOS (ICD10-I71.9). Electronically Signed   By: Ilona Sorrel M.D.   On: 08/14/2019 09:48   ASSESSMENT AND PLAN:  Shloma is a 78 year old male presented to the ED with generalized weakness and ongoing left knee pain swelling.  1. Acute on chronic COPD exacerbation -patient uses chronic home oxygen. Continue nebulizer, PO prednisone -Chest x-ray no pneumonia. No indication for antibiotic -incentive spirometer -Patient claims chronically he is on 5 L of oxygen and has chronic baseline wheeze  2.  Acute left knee pain with swelling-MCL strain -had MRI of the left knee during his last admission on October 20 showed effusion with inferior meniscal tear. Home health physical therapy was recommended. Patient tells me he is not able to bear any weight on that leg. -X-ray of the left knee joint shows effusion. -Orthopedic Dr. Harlow Mares has seen the patient, recommending brace to the left knee, compression, ice and elevation and increased to participate in physical therapy -PRN pain meds  3.  Anemia Hemodynamically stable no acute bleed noticed  check stool for occult blood Monitor hemoglobin hematocrit closely Iron studies B12 folate Will consider gastroenterology consult if needed   4.Chronic diastolic  heart failure with chronic lower extremity edema does not appear to be acutely volume overloaded -continue home dose of Lasix  4. Type II diabetes continue home meds  5. Chronic normocytic anemia no active bleeding stable  6. Hyperkalemia-resolved potassium at 4.5.  Resume lisinopril  7. Psych consult - regarding decision making capacity - as requested by his wife   Disposition : PT eval , pt is agreeable to go to rehab  Case discussed with Care Management/Social Worker. Call placed to patient's wife [name] with patient's permission at [phone] and spoke to her in detail ,all questions were answered to pts and wifes satisfaction   CODE STATUS:  full  DVT Prophylaxis: lovenonx  TOTAL TIME TAKING CARE OF THIS PATIENT: *36* minutes.  >50% time spent on counselling and coordination of care  POSSIBLE D/C IN few DAYS, DEPENDING ON CLINICAL CONDITION.  Note: This dictation was prepared with Dragon dictation along with smaller phrase technology. Any transcriptional errors that result from this process are unintentional.  Nicholes Mango M.D on 08/15/2019 at 2:45 PM  Between 7am to 6pm - Pager - [phone]  After 6pm go to [email or web address] - password [email or web address] [facility]  Office  [phone]  CC: Primary care physician; [name], MDPatient ID: [name], male   DOB: 1941/07/28, 78 y.o.   MRN: [id number]

## 2019-08-15 NOTE — Progress Notes (Signed)
Orthopedic Tech Progress Note Patient Details:  [name] Jan 31, 1941 [id number] Called in order to [facility] for a HINGED KNEE brace for patient. Patient ID: [name], male   DOB: 1941/01/01, 78 y.o.   MRN: [id number]   Janit Pagan 08/15/2019, 8:45 AM

## 2019-08-15 NOTE — Progress Notes (Signed)
Lab was in patients room to draw blood work. Lab tech entered patients room and [place], the lab tech stated that patient asked her to empty urinal. She stated that was not in her scope of practice but would call the nurse tech. Patient made a racist remark and lab tech left the room. MD, charge nurse and [facility] notified.

## 2019-08-15 NOTE — TOC Progression Note (Signed)
Transition of Care [facility]) - Progression Note    Patient Details  Name: JALIK PHARO MRN: [id number] Date of Birth: 05-16-1941  Transition [facility]) CM/SW Contact  Ross Ludwig,  Phone Number: 08/15/2019, 4:10 PM  Clinical Narrative:     CSW received message from attending physician that patient's wife would like CSW to contact her.  CSW contacted lead Aida Raider due to last admission this patient's wife stated she did not want to talk to this CSW ever again.  Lead recommended that two CSWs speak to her via speaker phone.  This CSW and other CSW [place] Scrape, attempted to contact patient's wife, and message was left on voice mail.  Patient has not been able to work with PT due to various medical reasons.  Patient has insurance through [employer].  CSW to begin insurance authorization once PT is able to work with patient.   Expected Discharge Plan: [name] Barriers to Discharge: Continued Medical Work up  Expected Discharge Plan and Services Expected Discharge Plan: [place] In-house Referral: Clinical Social Work     Living arrangements for the past 2 months: Single Family Home                 DME Arranged: N/A DME Agency: NA     Representative spoke with at DME Agency: na   [place]: Well [place] Date [place]: 08/15/19 Time [place]: 1200 Representative spoke with at [place]: [place] ([place]) Interventions    Readmission Risk Interventions Readmission Risk Prevention Plan 08/15/2019 07/08/2019 04/13/2019  Transportation Screening Complete Complete Complete  Medication Review Press photographer) Referral to Pharmacy Complete Complete  PCP or Specialist appointment within 3-5 days of discharge Complete Complete Complete  HRI or Home Care Consult Complete Complete Complete  SW Recovery Care/Counseling Consult - Complete Complete  Palliative Care Screening  Complete Patient Refused Patient [place] Complete Not Applicable Patient Refused  Some recent data might be hidden

## 2019-08-15 NOTE — TOC Initial Note (Signed)
Transition of Care [facility]) - Initial/Assessment Note    Patient Details  Name: [name] MRN: [id number] Date of Birth: [birth year]  [facility]) CM/SW Contact:    Ross Ludwig, LCSW Phone Number: 08/15/2019, 4:02 PM  Clinical Narrative:                  Patient is a 78 year old male who was just recently discharged from the hospital.  Patient discharged back home with home health.  Patient is married, and alert and oriented x4.  Patient has been inappropriate talking to nursing staff on this floor.  Patient has also made racist remarks to staff.  Patient stated to PT, that he is agreeable to go to SNF for short term rehab this time around pending insurance approval.  Patient's wife would like him to go to SNF this time as well.  Patient has not been able to participate with therapy yet.  Plan is for patient to discharge to SNF once insurance has been approved.  Expected Discharge Plan: Skilled Nursing Facility Barriers to Discharge: Continued Medical Work up   Patient Goals and CMS Choice Patient states their goals for this hospitalization and ongoing recovery are:: To go to SNF for rehab, then return back home if possible. CMS Medicare.gov Compare Post Acute Care list provided to:: Patient Choice offered to / list presented to : Patient, Spouse  Expected Discharge Plan and Services Expected Discharge Plan: [place] In-house Referral: Clinical Social Work     Living arrangements for the past 2 months: Single Family Home                 DME Arranged: N/A DME Agency: NA     Representative spoke with at DME Agency: na   [place]: Well [place] Date [place]: 08/15/19 Time [place]: 1200 Representative spoke with at [place]: [place] Arrangements/Services Living arrangements for the past 2 months: [place] with:: Spouse Patient language and need for interpreter reviewed:: Yes Do  you feel safe going back to the place where you live?: No   Patient is agreeable to going to SNF for short term rehab, then potentially returning back home.  Need for Family Participation in Patient Care: No (Comment) Care giver support system in place?: Yes (comment) Current home services: Home RN, Home PT, Home OT, Homehealth aide, DME Criminal Activity/Legal Involvement Pertinent to Current Situation/Hospitalization: No - Comment as needed  Activities of Daily Living Home Assistive Devices/Equipment: Bedside commode/3-in-1, Walker (specify type), Oxygen ADL Screening (condition at time of admission) Patient's cognitive ability adequate to safely complete daily activities?: Yes Is the patient deaf or have difficulty hearing?: No Does the patient have difficulty seeing, even when wearing glasses/contacts?: No Does the patient have difficulty concentrating, remembering, or making decisions?: No Patient able to express need for assistance with ADLs?: Yes Does the patient have difficulty dressing or bathing?: Yes Independently performs ADLs?: No Does the patient have difficulty walking or climbing stairs?: Yes Weakness of Legs: Both Weakness of Arms/Hands: None  Permission Sought/Granted Permission sought to share information with : Family Supports Permission granted to share information with : Yes, Verbal Permission Granted  Share Information with NAME: [name] [phone]  Permission granted to share info w AGENCY: SNF admissions        Emotional Assessment Appearance:: Appears older than stated age, Provocative Attitude/Demeanor/Rapport: Aggressive (Verbally and/or physically), Angry, Attention Seeking, Complaining Affect (typically observed):  Agitated, Angry, Blunt, Defensive, Frustrated, Stable Orientation: : Oriented to Self, Oriented to Place, Oriented to Situation, Oriented to  Time Alcohol / Substance Use: Alcohol Use, Tobacco Use Psych Involvement: Yes  (comment)(Psych saw him for competancy last hospital stay, and there is a consult for this hosptal stay as well.)  Admission diagnosis:  COPD exacerbation ([place]) [J44.1] Patient Active Problem List   Diagnosis Date Noted  . Encounter for competency evaluation   . Chest pain 08/04/2019  . Acute respiratory failure with hypoxia and hypercapnia ([place]) 07/06/2019  . Acute febrile illness   . Hyperglycemia   . Weakness 05/05/2019  . Febrile illness 04/27/2019  . Breathlessness   . Goals of care, counseling/discussion   . Palliative care by specialist   . Encounter for hospice care discussion   . COPD with acute exacerbation ([place]) 04/06/2019  . COPD exacerbation ([place]) 02/28/2019  . Community acquired pneumonia of left lower lobe of lung   . Hypoxia 12/26/2018  . Acute on chronic respiratory failure with hypoxia ([place])   . Acute on chronic diastolic CHF (congestive heart failure) ([place]) 10/02/2018  . Acute respiratory failure ([place]) 10/02/2018  . Acute [place] ulcer   . Acute GI bleeding 02/08/2018  . Anemia due to chronic kidney disease   . AKI (acute kidney injury) ([place]) 05/08/2017  . Diabetes mellitus with complication ([place]) [id number]  . Melena   . Benign neoplasm of sigmoid colon   . Benign neoplasm of descending colon   . AVM (arteriovenous malformation) of colon with hemorrhage   . GI bleed 04/05/2017  . Intermittent left lower quadrant abdominal pain   . Generalized weakness 11/09/2016  . Symptomatic anemia 10/21/2016  . Low back pain 07/02/2015  . Gastric AVM   . AVM (arteriovenous malformation) of duodenum, acquired with hemorrhage   . Bipolar 1 disorder, mixed, moderate ([place]) 04/16/2015  . Major depressive disorder, recurrent, severe without psychotic features ([place])   . Supplemental oxygen dependent 11/30/2014  . Iron deficiency anemia due to chronic blood loss 11/25/2014  . Vitamin D deficiency 08/10/2014  . Insomnia 08/10/2014  . Polypharmacy 08/10/2014  . Chronic pain  syndrome 08/10/2014  . 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 Abdominal pain 12/04/2013  . Aftercare following surgery of the circulatory system, [place] 11/04/2013  . Acute blood loss anemia 10/16/2013  . Multiple gastric polyps 09/10/2013  . Anxiety state 09/10/2013  . 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 Chronic hypoxemic respiratory failure ([place]) 05/21/2013  . Acute on chronic respiratory failure with hypoxia and hypercapnia ([place]) 12/04/2012  . Chronic GI bleeding 05/17/2012  . CAD (coronary artery disease) 04/17/2012  . Chronic diastolic CHF (congestive heart failure) ([place]) 03/27/2012  . COPD (chronic obstructive pulmonary disease) ([place]) 09/13/2011  . CERUMEN IMPACTION, BILATERAL 11/11/2010  . Hyperlipidemia 08/18/2009  . Essential hypertension 08/18/2009  . ALLERGIC RHINITIS 08/18/2009  . AAA (abdominal aortic aneurysm) ([place]) 12/15/2008   PCP:  [name], MD Pharmacy:   [id number] - [place], [address] [place] [address] Phone: [phone] Fax: [facility] - [place], [place] [address] Phone: [phone] Fax: [phone]     Social Determinants of Health (SDOH) Interventions    Readmission Risk Interventions Readmission Risk Prevention Plan 08/15/2019 07/08/2019 04/13/2019  Transportation Screening Complete Complete Complete  Medication Review (RN Care Manager) Referral to Pharmacy Complete Complete  PCP  or Specialist appointment within 3-5 days of discharge Complete Complete Complete  HRI or Home Care Consult Complete Complete Complete  SW Recovery Care/Counseling Consult - Complete Complete  Palliative Care Screening Complete Patient Refused Patient Refused  [place] Complete Not Applicable Patient Refused  Some recent data might be hidden

## 2019-08-15 NOTE — Evaluation (Signed)
Physical Therapy Evaluation Patient Details Name: [name] MRN: [id number] DOB: [birth year] Today's Date: 08/15/2019   History of Present Illness  78 y.o. male admitted for chest pain earlier this month, rehab was recommended however he refused.  Pt is now s/p another fall with L knee injury, general weakness . PMH includes COPD/chronic respiratory failure on 5-6 L, AAA, CAD, bipolar disorder, and DM.  Clinical Impression  Pt reports that knee pain has improved greatly over the last few days, with brace donned he was able to tolerate WBing though it w/o overt hesitation.  Pt showed good effort with 2 bouts of limited in-room ambulation but had significant fatigue/shortness of breath with the effort (on 2L, sats remained in the 90s). Pt did reasonably well but reports feeling very weak having been unable to get up the last few days and given that he has had 2 recent falls and his level of general deconditioning he agrees that some time at rehab to build strength and activity tolerance will be beneficial.    Follow Up Recommendations SNF    Equipment Recommendations  Other (comment)(TBD at next venue of care)    Recommendations for Other Services       Precautions / Restrictions Precautions Precautions: Fall Required Braces or Orthoses: Splint/Cast(ortho MD recommend knee braced, donned during PT) Restrictions Weight Bearing Restrictions: No      Mobility  Bed Mobility Overal bed mobility: Needs Assistance Bed Mobility: Supine to Sit     Supine to sit: Min guard Sit to supine: Min guard   General bed mobility comments: Pt slow, but confident in getting to sitting EOB  Transfers   Equipment used: Rolling walker (2 wheeled) Transfers: Sit to/from Stand Sit to Stand: Min guard         General transfer comment: elevated bed ~5" per pt request.  He was able to rise w/o direct assist and heavy RW use  Ambulation/Gait Ambulation/Gait assistance: Min assist Gait  Distance (Feet): 20 Feet Assistive device: Rolling walker (2 wheeled)       General Gait Details: 20 ft X 2, rest break between bouts, Pt was able to ambulate with relative confidence but did quickly become short of breath with the effort.  Pt on 2L O2 t/o the effort and sats stayed in the mid 90s.  Stairs            Wheelchair Mobility    Modified Rankin (Stroke Patients Only)       Balance Overall balance assessment: Needs assistance Sitting-balance support: Feet supported Sitting balance-Leahy Scale: Good     Standing balance support: Bilateral upper extremity supported Standing balance-Leahy Scale: Fair Standing balance comment: Pt reliant on the walker, no overt hesitation with WBing on L                             Pertinent Vitals/Pain Pain Assessment: No/denies pain Pain Score: (little L knee pain at rest, moderate increase with WBing)    Home Living Family/patient expects to be discharged to:: Private residence Living Arrangements: Spouse/significant other Available Help at Discharge: Family Type of Home: House Home Access: Stairs to enter Entrance Stairs-Rails: Psychiatric nurse of Steps: 6 Home Layout: One level Home Equipment: Environmental consultant - 2 wheels;Cane - single point;Bedside commode      Prior Function Level of Independence: Needs assistance   Gait / Transfers Assistance Needed: rarely walks more than ~15 ft, On chronic O2, uses RW  Hand Dominance        Extremity/Trunk Assessment   Upper Extremity Assessment Upper Extremity Assessment: Overall WFL for tasks assessed;Generalized weakness    Lower Extremity Assessment Lower Extremity Assessment: Overall WFL for tasks assessed;Generalized weakness(L LE grossly 4-/5, ~100 deg flexion; R grossly 4/5)       Communication      Cognition Arousal/Alertness: Awake/alert Behavior During Therapy: Agitated Overall Cognitive Status: Within Functional Limits  for tasks assessed                                 General Comments: difficult to keep pt on task, but willing to participate on his own terms      General Comments      Exercises     Assessment/Plan    PT Assessment Patient needs continued PT services  PT Problem List Decreased strength;Decreased activity tolerance;Decreased balance;Decreased mobility;Decreased range of motion;Decreased coordination;Cardiopulmonary status limiting activity;Pain       PT Treatment Interventions DME instruction;Gait training;Functional mobility training;Therapeutic activities;Therapeutic exercise;Balance training;Patient/family education    PT Goals (Current goals can be found in the Care Plan section)  Acute Rehab PT Goals Patient Stated Goal: to return home PT Goal Formulation: With patient Time For Goal Achievement: 08/29/19 Potential to Achieve Goals: Fair    Frequency Min 2X/week   Barriers to discharge        Co-evaluation               AM-PAC PT "6 Clicks" Mobility  Outcome Measure Help needed turning from your back to your side while in a flat bed without using bedrails?: None Help needed moving from lying on your back to sitting on the side of a flat bed without using bedrails?: A Little Help needed moving to and from a bed to a chair (including a wheelchair)?: A Little Help needed standing up from a chair using your arms (e.g., wheelchair or bedside chair)?: None Help needed to walk in hospital room?: A Little Help needed climbing 3-5 steps with a railing? : A Lot 6 Click Score: 19    End of Session Equipment Utilized During Treatment: Gait belt;Oxygen Activity Tolerance: Patient limited by fatigue Patient left: with bed alarm set;with call bell/phone within reach Nurse Communication: Mobility status PT Visit Diagnosis: History of falling (Z91.81);Muscle weakness (generalized) (M62.81)    Time: [id number] PT Time Calculation (min) (ACUTE ONLY): 40  min   Charges:   PT Evaluation $PT Eval Low Complexity: 1 Low PT Treatments $Gait Training: 8-22 mins $Therapeutic Activity: 8-22 mins        Kreg Shropshire, DPT 08/15/2019, 6:02 PM

## 2019-08-16 LAB — CBC
HCT: 29.2 % — ABNORMAL LOW (ref 39.0–52.0)
Hemoglobin: 9.3 g/dL — ABNORMAL LOW (ref 13.0–17.0)
MCH: 27 pg (ref 26.0–34.0)
MCHC: 31.8 g/dL (ref 30.0–36.0)
MCV: 84.6 fL (ref 80.0–100.0)
Platelets: 221 10*3/uL (ref 150–400)
RBC: 3.45 MIL/uL — ABNORMAL LOW (ref 4.22–5.81)
RDW: 16.5 % — ABNORMAL HIGH (ref 11.5–15.5)
WBC: 9.6 10*3/uL (ref 4.0–10.5)
nRBC: 0.2 % (ref 0.0–0.2)

## 2019-08-16 LAB — BASIC METABOLIC PANEL
Anion gap: 12 (ref 5–15)
BUN: 30 mg/dL — ABNORMAL HIGH (ref 8–23)
CO2: 28 mmol/L (ref 22–32)
Calcium: 8.5 mg/dL — ABNORMAL LOW (ref 8.9–10.3)
Chloride: 92 mmol/L — ABNORMAL LOW (ref 98–111)
Creatinine, Ser: 1.13 mg/dL (ref 0.61–1.24)
GFR calc Af Amer: >60 mL/min (ref >60)
GFR calc non Af Amer: >60 mL/min (ref >60)
Glucose, Bld: 190 mg/dL — ABNORMAL HIGH (ref 70–99)
Potassium: 4.5 mmol/L (ref 3.5–5.1)
Sodium: 132 mmol/L — ABNORMAL LOW (ref 135–145)

## 2019-08-16 LAB — GLUCOSE, CAPILLARY
Glucose-Capillary: 183 mg/dL — ABNORMAL HIGH (ref 70–99)
Glucose-Capillary: 112 mg/dL — ABNORMAL HIGH (ref 70–99)
Glucose-Capillary: 264 mg/dL — ABNORMAL HIGH (ref 70–99)
Glucose-Capillary: 232 mg/dL — ABNORMAL HIGH (ref 70–99)

## 2019-08-16 LAB — OCCULT BLOOD X 1 CARD TO LAB, STOOL: Fecal Occult Bld: NEGATIVE

## 2019-08-16 MED ORDER — IPRATROPIUM-ALBUTEROL 0.5-2.5 (3) MG/3ML IN SOLN
3.0000 mL | Freq: Three times a day (TID) | RESPIRATORY_TRACT | Status: DC
  Administered 2019-08-16 – 2019-08-18 (×7): 3 mL via RESPIRATORY_TRACT
  Filled 2019-08-16 (×9): qty 3

## 2019-08-16 MED ORDER — SODIUM CHLORIDE 0.9 % IV SOLN
100.0000 mg | INTRAVENOUS | Status: AC
  Administered 2019-08-16 – 2019-08-18 (×3): 100 mg via INTRAVENOUS
  Filled 2019-08-16 (×3): qty 5

## 2019-08-16 NOTE — Care Management Important Message (Signed)
Important Message  Patient Details  Name: [name] MRN: [id number] Date of Birth: May 07, 1941   Medicare Important Message Given:  Yes     Juliann Pulse A Leigh Blas 08/16/2019, 3:06 PM

## 2019-08-16 NOTE — Consult Note (Signed)
  Patient requested to see provider as well as patient's wife requested to speak to psychiatry.   Patient was spoken to today.  He reports that since going home he was quickly alarmed by how weak he became and unfortunately had a fall.  Patient states that this fall prompted him to return to the hospital.  Patient told this writer that he is aware that he should have probably went to rehab after his first hospitalization and he realizes now that he made a mistake by choosing to return home.  Writer was able to have a conversation with this patient for least 30 minutes  during which time he was able to show that he was in the same mental state as the last consult placed proximately 1 week ago.  Patient does have a past diagnosis of bipolar disorder although patient is showing no evidence of acute symptoms including no SI, no HI, no AVH, no mania, no paranoia, no psychosis.  Patient is able to clearly articulate his decision making as well as understands the repercussions of his choices.  The patient is agreeable to going to an SNF following hospitalization, therefore full capacity evaluation was not completed.   Patient's wife was also spoken to.  Patient's wife had some concerns and was wondering if a psychiatrist would be able to provide her with a signature for power of attorney papers.  Writer spoke to patient wife and explained that the services will not be provided and she will need to find an outpatient psychiatrist or other doctor to assist her with the paperwork.   Patient was instructed to request his medical records if he requires a copy of his evaluation.  No further psychiatric management

## 2019-08-16 NOTE — Plan of Care (Signed)
  Problem: Clinical Measurements: Goal: Cardiovascular complication will be avoided Outcome: Progressing   Problem: Clinical Measurements: Goal: Respiratory complications will improve Outcome: Not Progressing Note: Patient requires 5L of oxygen

## 2019-08-16 NOTE — TOC Progression Note (Signed)
Transition of Care [facility]) - Progression Note    Patient Details  Name: [name] MRN: [id number] Date of Birth: 1941/02/17  Transition of Care [facility]) CM/SW Contact  Ross Ludwig, [place] Phone Number: 08/16/2019, 9:22 PM  Clinical Narrative: CSW spoke with patient, and he was given bed offers, patient chose [facility] [place] [facility].  CSW contacted Neoma Laming at [facility] [phone] and they have to verify insurance authorization and may not be able to accept patient until Monday.  CSW received auth from [facility] reference number [id number].  Insurance auth good through Monday, if patient is not ready for discharge Monday, new authorization will be needed.  Patient inquired about how insurance will pay for stay, CSW informed him as long as he is working with therapy and making progress insurance will cover 100% for 20 days, if he stops making progress or participating with therapy, then insurance will stop paying and he will have to be private pay if he is not ready to discharge back home.  CSW informed him that the social worker at SNF can help with helping him apply for Medicaid.    Patient asked CSW to contact his wife, and CSW attempted to call her, and left a voice mail, awaiting for call back. CSW to continue to follow patient's progress throughout discharge planning.     Expected Discharge Plan: [place] Barriers to Discharge: Continued Medical Work up  Expected Discharge Plan and Services Expected Discharge Plan: [place] In-house Referral: Clinical Social Work     Living arrangements for the past 2 months: Single Family Home                 DME Arranged: N/A DME Agency: NA     Representative spoke with at DME Agency: na   [place]: Well [place] Date [place]: 08/15/19 Time [place]: 1200 Representative spoke with at [place]: [place] ([place]) Interventions    Readmission Risk  Interventions Readmission Risk Prevention Plan 08/15/2019 07/08/2019 04/13/2019  Transportation Screening Complete Complete Complete  Medication Review Press photographer) Referral to Pharmacy Complete Complete  PCP or Specialist appointment within 3-5 days of discharge Complete Complete Complete  HRI or Home Care Consult Complete Complete Complete  SW Recovery Care/Counseling Consult - Complete Complete  Palliative Care Screening Complete Patient Refused Patient [place] Complete Not Applicable Patient Refused  Some recent data might be hidden

## 2019-08-16 NOTE — NC FL2 (Signed)
[place] LEVEL OF CARE SCREENING TOOL     IDENTIFICATION  Patient Name: [name] Birthdate: 1941-04-28 Sex: male Admission Date (Current Location): 08/13/2019  [place] and [place] Number:  Engineering geologist and Address:  [facility], [address] [place],  [address]      Provider Number: [id number]  Attending Physician Name and Address:  Nicholes Mango, MD  Relative Name and Phone Number:  D[place]n, [place]   [phone]    Current Level of Care: Hospital Recommended Level of Care: [place] Prior Approval Number:    Date Approved/Denied:   PASRR Number: [id number]  Discharge Plan: SNF    Current Diagnoses: Patient Active Problem List   Diagnosis Date Noted  . Encounter for competency evaluation   . Chest pain 08/04/2019  . Acute respiratory failure with hypoxia and hypercapnia ([place]) 07/06/2019  . Acute febrile illness   . Hyperglycemia   . Weakness 05/05/2019  . Febrile illness 04/27/2019  . Breathlessness   . Goals of care, counseling/discussion   . Palliative care by specialist   . Encounter for hospice care discussion   . COPD with acute exacerbation ([place]) 04/06/2019  . COPD exacerbation ([place]) 02/28/2019  . Community acquired pneumonia of left lower lobe of lung   . Hypoxia 12/26/2018  . Acute on chronic respiratory failure with hypoxia ([place])   . Acute on chronic diastolic CHF (congestive heart failure) ([place]) 10/02/2018  . Acute respiratory failure ([place]) 10/02/2018  . Acute [place] ulcer   . Acute GI bleeding 02/08/2018  . Anemia due to chronic kidney disease   . AKI (acute kidney injury) ([place]) 05/08/2017  . Diabetes mellitus with complication ([place]) [id number]  . Melena   . Benign neoplasm of sigmoid colon   . Benign neoplasm of descending colon   . AVM (arteriovenous malformation) of colon with hemorrhage   . GI bleed 04/05/2017  . Intermittent left lower quadrant  abdominal pain   . Generalized weakness 11/09/2016  . Symptomatic anemia 10/21/2016  . Low back pain 07/02/2015  . Gastric AVM   . AVM (arteriovenous malformation) of duodenum, acquired with hemorrhage   . Bipolar 1 disorder, mixed, moderate ([place]) 04/16/2015  . Major depressive disorder, recurrent, severe without psychotic features ([place])   . Supplemental oxygen dependent 11/30/2014  . Iron deficiency anemia due to chronic blood loss 11/25/2014  . Vitamin D deficiency 08/10/2014  . Insomnia 08/10/2014  . Polypharmacy 08/10/2014  . Chronic pain syndrome 08/10/2014  . 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 Abdominal pain 12/04/2013  . Aftercare following surgery of the circulatory system, [place] 11/04/2013  . Acute blood loss anemia 10/16/2013  . Multiple gastric polyps 09/10/2013  . Anxiety state 09/10/2013  . 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 Chronic hypoxemic respiratory failure ([place]) 05/21/2013  . Acute on chronic respiratory failure with hypoxia and hypercapnia ([place]) 12/04/2012  . Chronic GI bleeding 05/17/2012  . CAD (coronary artery disease) 04/17/2012  . Chronic diastolic CHF (congestive heart failure) ([place]) 03/27/2012  . COPD (chronic obstructive pulmonary disease) ([place]) 09/13/2011  . CERUMEN IMPACTION, BILATERAL 11/11/2010  . Hyperlipidemia 08/18/2009  . Essential hypertension 08/18/2009  . ALLERGIC RHINITIS 08/18/2009  . AAA (abdominal aortic aneurysm) ([place]) 12/15/2008    Orientation RESPIRATION BLADDER Height & Weight     Self, Time, Situation, Place  O2(5L) Continent Weight: 276 lb 9.6 oz (125.5 kg) Height:  6\' 3"  (190.5 cm)  BEHAVIORAL  SYMPTOMS/MOOD NEUROLOGICAL BOWEL NUTRITION STATUS      Incontinent Diet(Cardiac Carb modified)  AMBULATORY STATUS COMMUNICATION OF NEEDS Skin   Limited Assist Verbally Normal                       Personal Care Assistance Level of Assistance   Bathing, Feeding, Dressing Bathing Assistance: Limited assistance Feeding assistance: Independent Dressing Assistance: Limited assistance     Functional Limitations Info  Sight, Hearing, Speech Sight Info: Adequate Hearing Info: Adequate Speech Info: Adequate    SPECIAL CARE FACTORS FREQUENCY  PT (By licensed PT), OT (By licensed OT), Speech therapy     PT Frequency: Minimum 5x a week OT Frequency: Minimum 5x a week     Speech Therapy Frequency: Minimum 5x a week      Contractures Contractures Info: Not present    Additional Factors Info  Code Status, Allergies, Psychotropic, Insulin Sliding Scale Code Status Info: Full Code Allergies Info: Morphine And Related Penicillins Zolpidem Demerol Dilaudid Levofloxacin Psychotropic Info: citalopram (CELEXA) tablet 20 mg Insulin Sliding Scale Info: insulin aspart (novoLOG) injection 0-9 Units 3x a day with meals       Current Medications (08/16/2019):  This is the current hospital active medication list Current Facility-Administered Medications  Medication Dose Route Frequency Provider Last Rate Last Dose  . 0.9 %  sodium chloride infusion  250 mL Intravenous PRN Demetrios Loll, MD      . acetaminophen (TYLENOL) tablet 650 mg  650 mg Oral Q6H PRN Demetrios Loll, MD       Or  . acetaminophen (TYLENOL) suppository 650 mg  650 mg Rectal Q6H PRN Demetrios Loll, MD      . albuterol (PROVENTIL) (2.5 MG/3ML) 0.083% nebulizer solution 2.5 mg  2.5 mg Nebulization Q2H PRN Demetrios Loll, MD      . albuterol (PROVENTIL) (2.5 MG/3ML) 0.083% nebulizer solution 2.5 mg  2.5 mg Nebulization TID Nicholes Mango, MD   2.5 mg at 08/16/19 0801  . ALPRAZolam Duanne Moron) tablet 0.25 mg  0.25 mg Oral TID PRN Demetrios Loll, MD   0.25 mg at 08/16/19 0213  . atorvastatin (LIPITOR) tablet 40 mg  40 mg Oral q1800 Demetrios Loll, MD   40 mg at 08/15/19 1758  . benzonatate (TESSALON) capsule 100 mg  100 mg Oral Q8H PRN Demetrios Loll, MD   100 mg at 08/15/19 1604  . bisacodyl (DULCOLAX) EC  tablet 5 mg  5 mg Oral Daily PRN Demetrios Loll, MD      . citalopram (CELEXA) tablet 20 mg  20 mg Oral Daily Demetrios Loll, MD   20 mg at 08/15/19 0931  . enoxaparin (LOVENOX) injection 40 mg  40 mg Subcutaneous Q24H Demetrios Loll, MD   40 mg at 08/15/19 2202  . gabapentin (NEURONTIN) capsule 400 mg  400 mg Oral QID Demetrios Loll, MD   400 mg at 08/15/19 2202  . guaiFENesin-dextromethorphan (ROBITUSSIN DM) 100-10 MG/5ML syrup 5 mL  5 mL Oral Q4H PRN Demetrios Loll, MD   5 mL at 08/16/19 0534  . HYDROcodone-acetaminophen (NORCO/VICODIN) 5-325 MG per tablet 1 tablet  1 tablet Oral Q4H PRN Demetrios Loll, MD   1 tablet at 08/16/19 [phone]  . insulin aspart (novoLOG) injection 0-5 Units  0-5 Units Subcutaneous QHS Demetrios Loll, MD   2 Units at 08/15/19 2202  . insulin aspart (novoLOG) injection 0-9 Units  0-9 Units Subcutaneous TID WC Demetrios Loll, MD   2 Units at 08/16/19 0827  . insulin aspart (  novoLOG) injection 25 Units  25 Units Subcutaneous TID WC Demetrios Loll, MD   25 Units at 08/16/19 [phone]  . insulin glargine (LANTUS) injection 35 Units  35 Units Subcutaneous BID Demetrios Loll, MD   35 Units at 08/16/19 0827  . ipratropium-albuterol (DUONEB) 0.5-2.5 (3) MG/3ML nebulizer solution 3 mL  3 mL Nebulization Once Duffy Bruce, MD      . lisinopril (ZESTRIL) tablet 2.5 mg  2.5 mg Oral Daily Gouru, Aruna, MD   2.5 mg at 08/15/19 1604  . Melatonin TABS 5 mg  5 mg Oral Daily PRN Lance Coon, MD   5 mg at 08/14/19 2130  . ondansetron (ZOFRAN) tablet 4 mg  4 mg Oral Q6H PRN Demetrios Loll, MD       Or  . ondansetron [facility]) injection 4 mg  4 mg Intravenous Q6H PRN Demetrios Loll, MD      . pantoprazole (PROTONIX) EC tablet 40 mg  40 mg Oral BID Demetrios Loll, MD   40 mg at 08/15/19 2202  . polyethylene glycol (MIRALAX / GLYCOLAX) packet 17 g  17 g Oral Daily PRN Demetrios Loll, MD      . predniSONE (DELTASONE) tablet 50 mg  50 mg Oral Q breakfast Demetrios Loll, MD   50 mg at 08/16/19 0831  . senna-docusate (Senokot-S) tablet 1 tablet  1 tablet Oral  QHS PRN Demetrios Loll, MD      . sodium chloride flush (NS) 0.9 % injection 10-40 mL  10-40 mL Intracatheter Q12H Duffy Bruce, MD   10 mL at 08/14/19 1000  . sodium chloride flush (NS) 0.9 % injection 10-40 mL  10-40 mL Intracatheter PRN Duffy Bruce, MD   10 mL at 08/15/19 0935  . sodium chloride flush (NS) 0.9 % injection 3 mL  3 mL Intravenous Q12H Demetrios Loll, MD   3 mL at 08/15/19 2203  . sodium chloride flush (NS) 0.9 % injection 3 mL  3 mL Intravenous PRN Demetrios Loll, MD      . tiotropium [facility]) inhalation capsule [facility] use ONLY) 18 mcg  18 mcg Inhalation Daily Demetrios Loll, MD   18 mcg at 08/15/19 [id number]     Discharge Medications: Please see discharge summary for a list of discharge medications.  Relevant Imaging Results:  Relevant Lab Results:   Additional Information SSN [id number]  Ross Ludwig, LCSW

## 2019-08-16 NOTE — Progress Notes (Signed)
[facility] [place] NAME: [name]    MR#:  [id number]  DATE OF BIRTH:  June 14, 1941  SUBJECTIVE:   Patient is resting comfortably, is very mean to medical staff Stool for occult blood is negative, did not notice any melena or blood in his stool.  Answering questions appropriately and agreeable to go to rehabilitation center.  He wants to talk to psychiatrist and Education officer, museum.  Reports chronic cough and wheezing from his underlying emphysema Wife reports that he cannot take care of him at home Patient quit smoking 20 years ago REVIEW OF SYSTEMS:   Review of Systems  Constitutional: Negative for fever and weight loss.  HENT: Negative for ear pain, hearing loss, sore throat and tinnitus.   Eyes: Negative for blurred vision and double vision.  Respiratory: Positive for cough and wheezing. Negative for hemoptysis and sputum production.   Cardiovascular: Negative for chest pain and palpitations.  Gastrointestinal: Negative for blood in stool, diarrhea, melena, nausea and vomiting.  Genitourinary: Negative for frequency and urgency.  Neurological: Negative for dizziness, tingling, tremors, focal weakness, weakness and headaches.  Psychiatric/Behavioral: The patient is not nervous/anxious and does not have insomnia.    Tolerating Diet: Tolerating PT:   DRUG ALLERGIES:   Allergies  Allergen Reactions  . Morphine And Related Shortness Of Breath, Nausea And Vomiting, Rash and Other (See Comments)    Reaction:  Hallucinations   . Penicillins Anaphylaxis, Hives and Other (See Comments)    10/02/18 - discussed with patient and he states he can tolerate penicillin capsules and had some hives when he was 78yo and received pcn injections  Has patient had a PCN reaction causing immediate rash, facial/tongue/throat swelling, SOB or lightheadedness with hypotension: Yes Has patient had a PCN reaction causing severe rash involving mucus  membranes or skin necrosis: No Has patient had a PCN reaction that required hospitalization No Has patient had a PCN reaction occurring within the last 10 y  . Zolpidem Shortness Of Breath  . Demerol [Meperidine] Other (See Comments)    Reaction:  Hallucinations    . Dilaudid [Hydromorphone Hcl] Other (See Comments)    Reaction:  Hallucinations   . Levofloxacin Other (See Comments)    Reaction:  Unknown     VITALS:  Blood pressure (!) 156/99, pulse 91, temperature 98 F (36.7 C), temperature source Oral, resp. rate 16, height 6\' 3"  (1.905 m), weight 125.5 kg, SpO2 97 %.  PHYSICAL EXAMINATION:   Physical Exam  GENERAL:  78 y.o.-year-old patient lying in the bed with no acute distress.  EYES: Pupils equal, round, reactive to light and accommodation. No scleral icterus. Extraocular muscles intact.  HEENT: Head atraumatic, normocephalic. Oropharynx and nasopharynx clear.  NECK:  Supple, no jugular venous distention. No thyroid enlargement, no tenderness.  LUNGS: Normal breath sounds bilaterally, no wheezing, rales, rhonchi. No use of accessory muscles of respiration.  CARDIOVASCULAR: S1, S2 normal. No murmurs, rubs, or gallops.  ABDOMEN: Soft, nontender, nondistended. Bowel sounds present.  EXTREMITIES: Left knee joint is tender in brace, no cyanosis, clubbing or edema b/l.    NEUROLOGIC: Cranial nerves II through XII are intact. No focal Motor or sensory deficits b/l.   PSYCHIATRIC:  patient is alert and oriented x 3.  SKIN: No obvious rash, lesion, or ulcer.   LABORATORY PANEL:  CBC Recent Labs  Lab 08/16/19 0725  WBC 9.6  HGB 9.3*  HCT 29.2*  PLT 221    Chemistries  Recent  Labs  Lab 08/13/19 1010  08/16/19 0725  NA  --    < > 132*  K  --    < > 4.5  CL  --    < > 92*  CO2  --    < > 28  GLUCOSE  --    < > 190*  BUN  --    < > 30*  CREATININE  --    < > 1.13  CALCIUM  --    < > 8.5*  MG 2.0  --   --    < > = values in this interval not displayed.   Cardiac  Enzymes No results for input(s): TROPONINI in the last 168 hours. RADIOLOGY:  No results found. ASSESSMENT AND PLAN:  Iancarlo is a 78 year old male presented to the ED with generalized weakness and ongoing left knee pain swelling.  1. Acute on chronic COPD exacerbation -patient uses chronic home oxygen. Continue nebulizer, PO prednisone -Chest x-ray no pneumonia. No indication for antibiotic -incentive spirometer -Patient claims chronically he is on 5 L of oxygen and has chronic baseline wheeze  2.  Acute left knee pain with swelling-MCL strain -had MRI of the left knee during his last admission on October 20 showed effusion with inferior meniscal tear. Home health physical therapy was recommended. Patient tells me he is not able to bear any weight on that leg. -X-ray of the left knee joint shows effusion. -Orthopedic Dr. Harlow Mares has seen the patient, recommending brace to the left knee, compression, ice and elevation and increased to participate in physical therapy -PRN pain meds  3.  Anemia-iron deficiency Hemodynamically stable no acute bleed noticed Stool for occult blood is negative Monitor hemoglobin hematocrit closely Iron at 24, ferritin at 25, B12-534 and folate 8.0 Will consider gastroenterology consult if needed IV iron infusion   4.Chronic diastolic heart failure with chronic lower extremity edema does not appear to be acutely volume overloaded -continue home dose of Lasix  4. Type II diabetes continue home meds  5. Chronic normocytic anemia no active bleeding stable  6. Hyperkalemia-resolved potassium at 4.5.  Resume lisinopril  7.  History of bipolar disorder- psych consult placed- regarding decision making capacity to go to rehabilitation center and bipolar disorder-  requested by his wife.  Patient is seen by Dr. Tula Nakayama, will talk to the wife also  Disposition : PT eval , pt is agreeable to go to rehab.  Social worker is following  Case discussed with Care  Management/Social Worker. Call placed to patient's wife [name] with patient's permission at [phone] and spoke to her  CODE STATUS: full  DVT Prophylaxis: lovenonx  TOTAL TIME TAKING CARE OF THIS PATIENT: *36* minutes.  >50% time spent on counselling and coordination of care  POSSIBLE D/C IN few DAYS, DEPENDING ON CLINICAL CONDITION.  Note: This dictation was prepared with Dragon dictation along with smaller phrase technology. Any transcriptional errors that result from this process are unintentional.  Nicholes Mango M.D on 08/16/2019 at 12:35 PM  Between 7am to 6pm - Pager - [phone]  After 6pm go to [email or web address] - password [email or web address] [facility]  Office  [phone]  CC: Primary care physician; [name], MDPatient ID: [name], male   DOB: [birth year], 78 y.o.   MRN: [id number]

## 2019-08-17 LAB — GLUCOSE, CAPILLARY
Glucose-Capillary: 130 mg/dL — ABNORMAL HIGH (ref 70–99)
Glucose-Capillary: 124 mg/dL — ABNORMAL HIGH (ref 70–99)
Glucose-Capillary: 284 mg/dL — ABNORMAL HIGH (ref 70–99)
Glucose-Capillary: 317 mg/dL — ABNORMAL HIGH (ref 70–99)

## 2019-08-17 MED ORDER — INSULIN ASPART 100 UNIT/ML ~~LOC~~ SOLN
5.0000 [IU] | Freq: Three times a day (TID) | SUBCUTANEOUS | Status: DC
  Administered 2019-08-17 – 2019-08-19 (×5): 5 [IU] via SUBCUTANEOUS
  Filled 2019-08-17 (×5): qty 1

## 2019-08-17 NOTE — Progress Notes (Signed)
Notify Dr. Margaretmary Eddy about patient's blood sugar this afternoon at 124, skip morning novolog dose because he also skip breakfast. He has a scheduled Novolog 25 units with meals plus sliding scale, asked if we need to decrease dose, MD change the order. RN will continue to monitor.

## 2019-08-17 NOTE — Progress Notes (Signed)
Pt asleep at breakfast. Pt awakened and asked about breakfast. Pt refused breakfast, he states he will wait for lunch. MD made aware, am insulin held.

## 2019-08-17 NOTE — Progress Notes (Signed)
[facility] [place] NAME: [name]    MR#:  [id number]  DATE OF BIRTH:  08-22-1941  SUBJECTIVE:   Patient is resting comfortably, is very mean to medical staff  Just woke up during my examination and skipped his breakfast   Answering questions appropriately and agreeable to go to rehabilitation center on Monday.  He endorses that his wife does not charge her phone and does not answer phones regularly and feels frustrated Patient reports chronic cough and wheezing from his underlying emphysema Wife reports that he cannot take care of him at home Patient quit smoking 20 years ago REVIEW OF SYSTEMS:   Review of Systems  Constitutional: Negative for fever and weight loss.  HENT: Negative for ear pain, hearing loss, sore throat and tinnitus.   Eyes: Negative for blurred vision and double vision.  Respiratory: Positive for wheezing. Negative for cough, hemoptysis and sputum production.   Cardiovascular: Negative for chest pain and palpitations.  Gastrointestinal: Negative for blood in stool, diarrhea, melena, nausea and vomiting.  Genitourinary: Negative for frequency and urgency.  Neurological: Negative for dizziness, tingling, tremors, focal weakness, weakness and headaches.  Psychiatric/Behavioral: The patient is not nervous/anxious and does not have insomnia.    Tolerating Diet: Tolerating PT:   DRUG ALLERGIES:   Allergies  Allergen Reactions  . Morphine And Related Shortness Of Breath, Nausea And Vomiting, Rash and Other (See Comments)    Reaction:  Hallucinations   . Penicillins Anaphylaxis, Hives and Other (See Comments)    10/02/18 - discussed with patient and he states he can tolerate penicillin capsules and had some hives when he was 78yo and received pcn injections  Has patient had a PCN reaction causing immediate rash, facial/tongue/throat swelling, SOB or lightheadedness with hypotension: Yes Has patient had a PCN  reaction causing severe rash involving mucus membranes or skin necrosis: No Has patient had a PCN reaction that required hospitalization No Has patient had a PCN reaction occurring within the last 10 y  . Zolpidem Shortness Of Breath  . Demerol [Meperidine] Other (See Comments)    Reaction:  Hallucinations    . Dilaudid [Hydromorphone Hcl] Other (See Comments)    Reaction:  Hallucinations   . Levofloxacin Other (See Comments)    Reaction:  Unknown     VITALS:  Blood pressure 123/82, pulse 92, temperature (!) 97.4 F (36.3 C), temperature source Oral, resp. rate 18, height 6\' 3"  (1.905 m), weight 125.7 kg, SpO2 100 %.  PHYSICAL EXAMINATION:   Physical Exam  GENERAL:  78 y.o.-year-old patient lying in the bed with no acute distress.  EYES: Pupils equal, round, reactive to light and accommodation. No scleral icterus. Extraocular muscles intact.  HEENT: Head atraumatic, normocephalic. Oropharynx and nasopharynx clear.  NECK:  Supple, no jugular venous distention. No thyroid enlargement, no tenderness.  LUNGS: Normal breath sounds bilaterally, minimal sporadic wheezing, no rales, rhonchi. No use of accessory muscles of respiration.  CARDIOVASCULAR: S1, S2 normal. No murmurs, rubs, or gallops.  ABDOMEN: Soft, nontender, nondistended. Bowel sounds present.  EXTREMITIES: Left knee joint is tender in brace, no cyanosis, clubbing or edema b/l.    NEUROLOGIC: Cranial nerves II through XII are intact. No focal Motor or sensory deficits b/l.   PSYCHIATRIC:  patient is alert and oriented x 3.  SKIN: No obvious rash, lesion, or ulcer.   LABORATORY PANEL:  CBC Recent Labs  Lab 08/16/19 0725  WBC 9.6  HGB 9.3*  HCT 29.2*  PLT 221    Chemistries  Recent Labs  Lab 08/13/19 1010  08/16/19 0725  NA  --    < > 132*  K  --    < > 4.5  CL  --    < > 92*  CO2  --    < > 28  GLUCOSE  --    < > 190*  BUN  --    < > 30*  CREATININE  --    < > 1.13  CALCIUM  --    < > 8.5*  MG 2.0  --   --     < > = values in this interval not displayed.   Cardiac Enzymes No results for input(s): TROPONINI in the last 168 hours. RADIOLOGY:  No results found. ASSESSMENT AND PLAN:  [name] is a 78 year old male presented to the ED with generalized weakness and ongoing left knee pain swelling.  1. Acute on chronic COPD exacerbation -Clinically better -patient uses chronic home oxygen. Continue nebulizer, PO prednisone -Chest x-ray no pneumonia. No indication for antibiotic -incentive spirometer -Patient claims chronically he is on 5 L of oxygen and has chronic baseline wheeze  2.  Acute left knee pain with swelling-MCL strain -had MRI of the left knee during his last admission on October 20 showed effusion with inferior meniscal tear. Home health physical therapy was recommended. Patient tells me he is not able to bear any weight on that leg. -X-ray of the left knee joint shows effusion. -Orthopedic Dr. Harlow Mares has seen the patient, recommending brace to the left knee, compression, ice and elevation and recommended to participate in physical therapy -PRN pain meds  3.  Anemia-iron deficiency Hemodynamically stable no acute bleed noticed Stool for occult blood is negative Monitor hemoglobin hematocrit closely Iron at 24, ferritin at 25, B12-534 and folate 8.0 Will consider gastroenterology consult if needed IV iron infusion for 3 doses started on 08/16/2019   4.Chronic diastolic heart failure with chronic lower extremity edema does not appear to be acutely volume overloaded -continue home dose of Lasix  4. Type II diabetes continue home meds  5. Chronic normocytic anemia no active bleeding stable  6. Hyperkalemia-resolved potassium at 4.5.  Resume lisinopril  7.  History of bipolar disorder- psych consult placed- regarding decision making capacity to go to rehabilitation center and bipolar disorder-  requested by his wife.  Patient is seen by Dr. Tula Nakayama, spoke to pts wife    Disposition : PT eval , pt is agreeable to go to rehab.  Social worker is reporting the patient can go to rehabilitation center on Monday  Case discussed with Care Management/Social Worker. Call placed to patient's wife [name] with patient's permission at [phone] and spoke to her , awaiting callback CODE STATUS: full  DVT Prophylaxis: lovenonx  TOTAL TIME TAKING CARE OF THIS PATIENT: *35* minutes.  >50% time spent on counselling and coordination of care  POSSIBLE D/C IN few DAYS, DEPENDING ON CLINICAL CONDITION.  Note: This dictation was prepared with Dragon dictation along with smaller phrase technology. Any transcriptional errors that result from this process are unintentional.  Nicholes Mango M.D on 08/17/2019 at 12:30 PM  Between 7am to 6pm - Pager - [phone]  After 6pm go to [email or web address] - password [email or web address] [facility]  Office  [phone]  CC: Primary care physician; [name], MDPatient ID: [name], male   DOB: [birth year], 78 y.o.   MRN: [id number]

## 2019-08-17 NOTE — Progress Notes (Signed)
Talked to patient's wife because she is asking if a doctor can sign patient's medical capacity to decide for himself. Explained to patient's wife that per psychiatric notes that this is needs to be done outpatiently. Also let her know that the psychiatric doctor that evaluated the patient is probably not here in the weekend and will probably back on Monday. Patient's wife still wants some doctor from this hospital to sign that paper because she will need it per his attorney's advice.

## 2019-08-18 DIAGNOSIS — J441 Chronic obstructive pulmonary disease with (acute) exacerbation: Secondary | ICD-10-CM

## 2019-08-18 LAB — GLUCOSE, CAPILLARY
Glucose-Capillary: 151 mg/dL — ABNORMAL HIGH (ref 70–99)
Glucose-Capillary: 144 mg/dL — ABNORMAL HIGH (ref 70–99)
Glucose-Capillary: 270 mg/dL — ABNORMAL HIGH (ref 70–99)
Glucose-Capillary: 255 mg/dL — ABNORMAL HIGH (ref 70–99)

## 2019-08-18 MED ORDER — FUROSEMIDE 40 MG PO TABS
40.0000 mg | ORAL_TABLET | Freq: Two times a day (BID) | ORAL | Status: DC
  Administered 2019-08-18 – 2019-08-19 (×3): 40 mg via ORAL
  Filled 2019-08-18 (×3): qty 1

## 2019-08-18 NOTE — Plan of Care (Signed)
  Problem: Education: Goal: Knowledge of General Education information will improve Description: Including pain rating scale, medication(s)/side effects and non-pharmacologic comfort measures Outcome: Progressing   Problem: Clinical Measurements: Goal: Respiratory complications will improve Outcome: Progressing   Problem: Safety: Goal: Ability to remain free from injury will improve Outcome: Progressing   

## 2019-08-18 NOTE — Progress Notes (Signed)
Writer spoke with patient's wife and reiterated psychiatry would not sign off on the form the wife brought in. Previously noted, Dr. Claris Gower (psych) as well as Vernell Morgans (bedside RN) have already spoken with wife and told her this would need to be taken care of in the outpatient setting.   Additionally, patient angry with floor staff today stating "I want someone to rub lotion on my knee!" Writer informed patient in order to promote independence and keep patient active, we would provide lotion to him and he could rub his knee (patient has demonstrated today he is able to bend his leg and reach his knee). Patient stated "I want what I want" and said he would not rub lotion on his own knee. Knee brace removed to "let his knee breathe."

## 2019-08-18 NOTE — Progress Notes (Signed)
PROGRESS NOTE    [name]  [id number] DOB: 02/23/1941 DOA: 08/13/2019 PCP: [name], MD   Brief Narrative:  Per admitting history and physical [name]  is a 78 y.o. male with a known history of multiple medical problems as below.  The patient was recently admitted for chest pain with negative cardiac work-up.  He was recommended for skilled nursing facility placement but refused.  he has had worsening intermittent chest pain, shortness of breath and generalized weakness.  He also complains of knee pain.  The patient is treated with nebulizer and a steroid.  ED physician request admission   Assessment & Plan:   Active Problems:   COPD exacerbation ([place])  1. Acute on chronic COPD exacerbation -As previously noted, clinically better -patient uses chronic home oxygen. Continue nebulizer, PO prednisone -Chest x-ray no pneumonia. No indication for antibiotic -incentive spirometer -Patient claims chronically he is on 5 L of oxygen and has chronic baseline wheeze  2.  Acute left knee pain with swelling-MCL strain -had MRI of the left knee during his last admission on October 20 showed effusion with inferior meniscal tear. Home health physical therapy was recommended. Patient tells me he is not able to bear any weight on that leg. -X-ray of the left knee joint shows effusion. -Orthopedic Dr. Harlow Mares has seen the patient, recommending brace to the left knee, compression, ice and elevation and recommended to participate in physical therapy -PRN pain meds  3.  Anemia-iron deficiency Hemodynamically stable no acute bleed noticed Stool for occult blood is negative Monitor hemoglobin hematocrit closely IV iron infusion for 3 doses started on 08/16/2019 Outpatient GI follow-up  4.Chronic diastolic heart failure with chronic lower extremity edema does not appear to be acutely volume overloaded -continue home dose of Lasix -Restarted at 40 twice daily  4. Type II  diabetes continue home meds  5. Chronic normocytic anemia no active bleeding stable  6. Hyperkalemia-resolved potassium at 4.5.  Resume lisinopril  7.  History of bipolar disorder- psych consult placed- regarding decision making capacity to go to rehabilitation center and bipolar disorder-  requested by his wife.  Patient is seen by Dr. Tula Nakayama, spoke to pts wife   Disposition : PT eval , pt is agreeable to go to rehab.  Social worker is reporting the patient can go to rehabilitation center on Monday.  DVT prophylaxis: Lovenox SQ  Code Status: Full code    Code Status Orders  (From admission, onward)         Start     Ordered   08/13/19 2047  Full code  Continuous     08/13/19 2048        Code Status History    Date Active Date Inactive Code Status Order ID Comments User Context   08/04/2019 1458 08/08/2019 0110 Full Code [id number]  Bettey Costa, MD ED   07/06/2019 0135 07/08/2019 1919 Full Code [id number]  Jani Gravel, MD ED   06/21/2019 0936 06/25/2019 1730 Full Code [id number]  Mercy Riding, MD ED   05/05/2019 1137 05/06/2019 2332 Full Code [id number]  Caren Griffins, MD Inpatient   04/27/2019 2033 04/28/2019 1819 Full Code [id number]  Lenore Cordia, MD ED   04/07/2019 1229 04/13/2019 1645 DNR [id number]  Wilhelmina Mcardle, MD Inpatient   04/06/2019 1048 04/07/2019 1229 Full Code [id number]  Lang Snow, NP ED   02/28/2019 0924 03/05/2019 2017 Full Code [id number]  Deatra James, MD ED   12/26/2018 1059 12/31/2018 2100  Full Code [id number]  Nita Sells, MD Inpatient   10/11/2018 2353 10/14/2018 1912 Full Code [id number]  Etta Quill, DO ED   10/02/2018 0309 10/06/2018 1741 Full Code [id number]  Vianne Bulls, MD ED   02/08/2018 1042 02/11/2018 1624 Full Code [id number]  Velvet Bathe, MD Inpatient   01/28/2018 0641 01/30/2018 2132 Full Code [id number]  Arta Silence, MD Inpatient   01/18/2018 2225 01/20/2018 2210 Full Code [id number]  Lance Coon, MD Inpatient    06/28/2017 1203 06/30/2017 1554 Full Code [id number]  Rondel Jumbo, PA-C ED   05/08/2017 1341 05/11/2017 2127 Full Code [id number]  Waldemar Dickens, MD ED   04/03/2017 1715 04/10/2017 1745 Full Code [id number]  Waldemar Dickens, MD ED   03/30/2017 1927 03/31/2017 1928 Full Code [id number]  Bonnielee Haff, MD Inpatient   02/22/2017 1614 02/24/2017 1829 Full Code [id number]  Fritzi Mandes, MD Inpatient   11/07/2016 1020 11/09/2016 1825 Full Code [id number]  Saundra Shelling, MD Inpatient   10/21/2016 0414 10/25/2016 1503 Full Code [id number]  Harvie Bridge, DO Inpatient   10/09/2016 1152 10/12/2016 1649 Full Code [id number]  Lytle Butte, MD ED   10/09/2016 1105 10/09/2016 1152 DNR [id number]  Lavetta Nielsen, Aaron Mose, MD ED   07/24/2016 0806 07/27/2016 1759 DNR [id number]  Harrie Foreman, MD Inpatient   02/10/2016 1528 02/12/2016 2009 DNR [id number]  Loletha Grayer, MD ED   10/26/2015 0314 11/01/2015 2005 Full Code [id number]  Saundra Shelling, MD Inpatient   08/20/2015 0441 08/22/2015 1649 Full Code [id number]  Hower, Aaron Mose, MD ED   08/20/2015 0254 08/20/2015 0441 Full Code [id number]  Lavetta Nielsen, Aaron Mose, MD ED   07/27/2015 2040 07/29/2015 1829 Full Code [id number]  Hower, Aaron Mose, MD ED   06/30/2015 1240 07/03/2015 1526 Full Code [id number]  Bettey Costa, MD Inpatient   05/19/2015 1459 05/23/2015 1621 Full Code [id number]  Max Sane, MD Inpatient   05/06/2015 1714 05/13/2015 1706 Full Code [id number]  Epifanio Lesches, MD ED   05/03/2015 1229 05/04/2015 1920 Full Code [id number]  Idelle Crouch, MD Inpatient   04/22/2015 0450 04/27/2015 1935 Full Code [id number]  Harrie Foreman, MD Inpatient   04/17/2015 1916 04/19/2015 1716 Full Code [id number]  Clovis Fredrickson, MD Inpatient   04/10/2015 0912 04/17/2015 1908 Full Code [id number]  Demetrios Loll, MD Inpatient   03/09/2015 1218 03/12/2015 1845 Full Code [id number]  Henreitta Leber, MD Inpatient   08/24/2014 2141 08/25/2014 1720 Full Code [id number]  Ivor Costa, MD ED   04/02/2014 2011 04/04/2014 1710  Full Code [id number]  Velvet Bathe, MD Inpatient   03/18/2014 1544 03/22/2014 1406 Full Code [id number]  Verlee Monte, MD Inpatient   12/04/2013 0613 12/10/2013 1954 Full Code [id number]  Rise Patience, MD Inpatient   10/15/2013 2011 10/18/2013 1840 Full Code [id number]  Fuller Plan, MD Inpatient   09/08/2013 0043 09/10/2013 1530 Full Code [id number]  Toy Baker, MD Inpatient   08/19/2013 1748 08/22/2013 1551 Full Code [id number]  Orson Eva, MD ED   05/15/2013 0126 05/17/2013 1659 Full Code [id number]  Theodis Blaze, MD Inpatient   12/04/2012 1723 12/08/2012 1656 Full Code [id number]  Janece Canterbury, MD Inpatient   05/17/2012 1256 05/21/2012 1809 Full Code [id number]  Campbell Lerner, RN ED   04/06/2012 1935 04/14/2012 1723 Full Code [id number]  Marylou Mccoy, RN Inpatient   03/27/2012 0444 03/28/2012 1756 Full Code [id number]  Theotis Barrio, RN Inpatient   Advance Care Planning Activity     Family Communication:  Discussed in detail with patient today Disposition Plan:   Patient remained inpatient, requires continued physical therapy for gait mobility balance strength and transfers.  Patient is unstable unsafe to discharge home. Consults called: None Admission status: Inpatient   Consultants:   None  Procedures:  Dg Thoracic Spine 2 View  Result Date: 07/30/2019 CLINICAL DATA:  Pt c/o generalized upper and lower back pain after falling today EXAM: THORACIC SPINE 2 VIEWS COMPARISON:  Thoracic spine radiographs 04/27/2019 FINDINGS: There is no evidence of thoracic spine fracture. Alignment is normal. No other significant bone abnormalities are identified. IMPRESSION: Negative thoracic spine radiographs. Electronically Signed   By: Audie Pinto M.D.   On: 07/30/2019 21:00   Dg Lumbar Spine 2-3 Views  Result Date: 07/30/2019 CLINICAL DATA:  Back pain after a fall today EXAM: LUMBAR SPINE - 2-3 VIEW COMPARISON:  Lumbar spine radiographs 04/27/2019 FINDINGS: Alignment intact. Vertebral  body heights are maintained without evidence of acute fracture. Stable mild anterior wedging of T12. Multilevel degenerative changes and lower facet arthropathy. 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 Nonobstructive bowel gas pattern. IMPRESSION: No evidence of acute fracture or static listhesis in the lumbar spine. Electronically Signed   By: Audie Pinto M.D.   On: 07/30/2019 21:02   Dg Ankle Complete Left  Result Date: 07/30/2019 CLINICAL DATA:  Pt c/o generalized upper and lower back pain and generalized left ankle pain after falling in his bathroom today. No hx of prior injuries or surgeries to any of the affected areas. EXAM: LEFT ANKLE COMPLETE - 3+ VIEW COMPARISON:  None. FINDINGS: There is no evidence of fracture, dislocation, or joint effusion. Talar dome intact. Tibiotalar joint space maintained. No focal osseous lesion. Regional soft tissues are unremarkable. IMPRESSION: No acute osseous abnormality in the left ankle. Electronically Signed   By: Audie Pinto M.D.   On: 07/30/2019 20:57   Mr Knee Left Wo Contrast  Result Date: 08/06/2019 CLINICAL DATA:  Chronic knee pain. EXAM: MRI OF THE LEFT KNEE WITHOUT CONTRAST TECHNIQUE: Multiplanar, multisequence MR imaging of the knee was performed. No intravenous contrast was administered. COMPARISON:  Radiographs 07/30/2019 FINDINGS: Examination is significantly limited by patient motion. 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 MENISCI Medial meniscus: Inferior articular surface tears along the posterior horn midbody junction region. Lateral meniscus:  Grossly intact. LIGAMENTS Cruciates:  Intact Collaterals:  Intact CARTILAGE Patellofemoral: Limited evaluation. No obvious full-thickness cartilage defects. Medial: Very limited examination. No obvious full-thickness cartilage defects. Lateral: Limited examination. No obvious full-thickness cartilage defects. Joint:  Moderate to large joint effusion and mild synovitis. Popliteal Fossa:   Moderate-sized leaking Baker's cyst. Extensor Mechanism: The patella retinacular structures are intact and the quadriceps and patellar tendons are intact. Mild proximal and distal patellar tendinopathy. Bones: Moderate edema like signal changes involving the posterior aspect of the medial tibia. This could be a bone contusion, stress reaction or subtle subchondral stress fracture. Other: Grossly normal knee musculature. IMPRESSION: 1. Very limited examination due to patient motion. 2. Inferior articular surface tearing involving the posterior horn mid body junction region of the medial meniscus. 3. Intact ligamentous structures. 4. Suspect mild degenerative chondrosis for age but no gross cartilage defects or osteochondral lesion. 5. Edema like signal changes in the posterior aspect of the medial tibia could be a bone contusion, stress reaction or subtle subchondral stress fracture. 6. Moderate to large joint effusion and moderate-sized leaking Baker's cyst. Electronically Signed   By: Marijo Sanes M.D.   On: 08/06/2019 12:49  US Venous Img Lower Bilateral  Result Date: 08/13/2019 CLINICAL DATA:  78 year old male with chest pain and leg swelling EXAM: BILATERAL LOWER EXTREMITY VENOUS DOPPLER ULTRASOUND TECHNIQUE: Gray-scale sonography with graded compression, as well as color Doppler and duplex ultrasound were performed to evaluate the lower extremity deep venous systems from the level of the common femoral vein and including the common femoral, femoral, profunda femoral, popliteal and calf veins including the posterior tibial, peroneal and gastrocnemius veins when visible. The superficial great saphenous vein was also interrogated. Spectral Doppler was utilized to evaluate flow at rest and with distal augmentation maneuvers in the common femoral, femoral and popliteal veins. COMPARISON:  None. FINDINGS: RIGHT LOWER EXTREMITY Common Femoral Vein: No evidence of thrombus. Normal compressibility, respiratory  phasicity and response to augmentation. Saphenofemoral Junction: No evidence of thrombus. Normal compressibility and flow on color Doppler imaging. Profunda Femoral Vein: No evidence of thrombus. Normal compressibility and flow on color Doppler imaging. Femoral Vein: No evidence of thrombus. Normal compressibility, respiratory phasicity and response to augmentation. Popliteal Vein: No evidence of thrombus. Normal compressibility, respiratory phasicity and response to augmentation. Calf Veins: No evidence of thrombus. Normal compressibility and flow on color Doppler imaging. Superficial Great Saphenous Vein: No evidence of thrombus. Normal compressibility and flow on color Doppler imaging. Other Findings:  None. LEFT LOWER EXTREMITY Common Femoral Vein: No evidence of thrombus. Normal compressibility, respiratory phasicity and response to augmentation. Saphenofemoral Junction: No evidence of thrombus. Normal compressibility and flow on color Doppler imaging. Profunda Femoral Vein: No evidence of thrombus. Normal compressibility and flow on color Doppler imaging. Femoral Vein: No evidence of thrombus. Normal compressibility, respiratory phasicity and response to augmentation. Popliteal Vein: No evidence of thrombus. Normal compressibility, respiratory phasicity and response to augmentation. Calf Veins: No evidence of thrombus. Normal compressibility and flow on color Doppler imaging. Superficial Great Saphenous Vein: No evidence of thrombus. Normal compressibility and flow on color Doppler imaging. Other Findings:  None. IMPRESSION: Sonographic survey of the bilateral lower extremities negative for DVT Electronically Signed   By: Corrie Mckusick D.O.   On: 08/13/2019 11:16   Dg Chest Portable 1 View  Result Date: 08/13/2019 CLINICAL DATA:  Chest pain EXAM: PORTABLE CHEST 1 VIEW COMPARISON:  08/04/2019 FINDINGS: Left basilar airspace opacity. Suspect small left effusion. No confluent opacity on the right. Heart is  mildly enlarged. Prior CABG. [place] again noted in the left upper lobe/apex. IMPRESSION: Left lower lobe atelectasis or infiltrate with small left effusion. Findings similar to prior study. Electronically Signed   By: Rolm Baptise M.D.   On: 08/13/2019 10:18   Dg Chest Portable 1 View  Result Date: 08/04/2019 CLINICAL DATA:  Left-sided chest pain. EXAM: PORTABLE CHEST 1 VIEW COMPARISON:  07/05/2019; 06/24/2019 FINDINGS: Examination is degraded due to patient body habitus and portable technique. Grossly unchanged enlarged cardiac silhouette and mediastinal contours with atherosclerotic plaque within the aortic arch. Post median sternotomy and CABG. Unchanged left basilar heterogeneous/consolidative opacities. The lungs remain hyperexpanded with flattening the bilaterally diaphragms mild diffuse slightly nodular thickening the pulmonary interstitium. Mild pulmonary venous congestion without frank evidence of edema. No new focal airspace opacities. No acute osseous abnormalities. IMPRESSION: Similar findings of cardiomegaly, pulmonary venous congestion and bibasilar atelectasis/scar, left greater than right, without definitive superimposed acute cardiopulmonary disease on this AP portable examination. Electronically Signed   By: Sandi Mariscal M.D.   On: 08/04/2019 05:30   Dg Knee Complete 4 Views Left  Result Date: 08/14/2019 CLINICAL DATA:  Left knee pain. EXAM:  LEFT KNEE - COMPLETE 4+ VIEW COMPARISON:  Left knee MRI 08/06/2019 and radiographs 07/30/2019 FINDINGS: A small to moderate-sized knee joint effusion is evident, not apparent on the prior radiographs although a joint effusion was seen on the interval MRI. No acute fracture or dislocation is identified. Joint space widths are preserved. There is mild diffuse subcutaneous edema. IMPRESSION: Knee joint effusion without acute osseous abnormality identified. Electronically Signed   By: Logan Bores M.D.   On: 08/14/2019 11:23   Dg Knee Complete 4 Views  Left  Result Date: 07/30/2019 CLINICAL DATA:  Fall, left knee pain. EXAM: LEFT KNEE - COMPLETE 4+ VIEW COMPARISON:  None. FINDINGS: No acute bony abnormality. Specifically, no fracture, subluxation, or dislocation. Joint spaces maintained. No joint effusion. Vascular calcifications noted. IMPRESSION: No acute bony abnormality. Electronically Signed   By: Rolm Baptise M.D.   On: 07/30/2019 16:43   Ct No Charge  Result Date: 08/14/2019 CLINICAL DATA:  Inpatient.  Dyspnea. EXAM: CT ADDITIONAL VIEWS AT NO CHARGE TECHNIQUE: Chest CT angiogram attempted. Reported 50 cc Omnipaque 350 left forearm extravasation. CONTRAST:  50 cc Omnipaque 350 IV. COMPARISON:  None. FINDINGS: Scout topogram demonstrates intact sternotomy wires and mild cardiomegaly. Axial contrast monitoring images through the great vessels demonstrate atherosclerotic aneurysmal ascending thoracic aorta with 4.5 cm diameter. IMPRESSION: 1. Reported 50 cc Omnipaque 350 left forearm extravasation. Unable to re-establish peripheral IV access. Patient returned to the inpatient floor. No diagnostic chest CT angiogram images were obtained. 2. Mild cardiomegaly visualized on the CT scout topogram. 3. 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 Ascending thoracic aortic aneurysm. Recommend semi-annual imaging followup by CTA or MRA and referral to cardiothoracic surgery if not already obtained. This recommendation follows 2010 ACCF/AHA/AATS/ACR/ASA/SCA/SCAI/SIR/STS/SVM Guidelines for the Diagnosis and Management of Patients With Thoracic Aortic Disease. Circulation. 2010; [id number]. Aortic aneurysm NOS (ICD10-I71.9). Electronically Signed   By: Ilona Sorrel M.D.   On: 08/14/2019 09:48   Dg Hips Bilat W Or Wo Pelvis 3-4 Views  Result Date: 07/30/2019 CLINICAL DATA:  Bilateral hip pain, knee pain EXAM: DG HIP (WITH OR WITHOUT PELVIS) 3-4V BILAT COMPARISON:  None. FINDINGS:  Status post stent graft and vascular coiling within the pelvis. Mild degenerative changes in the hips bilaterally, left greater than right with joint space narrowing and early spurring. No acute bony abnormality. Specifically, no fracture, subluxation, or dislocation. IMPRESSION: No acute bony abnormality. Electronically Signed   By: Rolm Baptise M.D.   On: 07/30/2019 16:38     Antimicrobials:   None   Subjective: Patient has been verbally combative with staff  Objective: Vitals:   08/17/19 1928 08/17/19 2044 08/18/19 0413 08/18/19 0806  BP:  (!) 148/81 134/74 120/63  Pulse:  (!) 107 91 91  Resp:  20 20 19   Temp:  98.5 F (36.9 C) 98.3 F (36.8 C) (!) 96.3 F (35.7 C)  TempSrc:  Oral Oral Axillary  SpO2: 97% 99% 98% 99%  Weight:   124.3 kg   Height:        Intake/Output Summary (Last 24 hours) at 08/18/2019 1559 Last data filed at 08/18/2019 1334 Gross per 24 hour  Intake --  Output 3900 ml  Net -3900 ml   Filed Weights   08/16/19 0508 08/17/19 0546 08/18/19 0413  Weight: 125.5 kg 125.7 kg 124.3 kg    Examination:  General exam: Appears calm and comfortable  Respiratory system: Clear to auscultation. Respiratory effort normal. Cardiovascular system:  S1 & S2 heard, RRR. No JVD, murmurs, rubs, gallops or clicks. No pedal edema. Gastrointestinal system: Abdomen is nondistended, soft and nontender. No organomegaly or masses felt. Normal bowel sounds heard. Central nervous system: Alert and oriented. No focal neurological deficits. Extremities: Warm well perfused, left knee with mild swelling and edema Skin: No rashes, lesions or ulcers Psychiatry: Judgement and insight appear normal. Mood & affect appropriate.     Data Reviewed: I have personally reviewed following labs and imaging studies  CBC: Recent Labs  Lab 08/13/19 1023 08/14/19 0336 08/15/19 0536 08/16/19 0725  WBC 9.4 6.8 8.6 9.6  HGB 9.5* 9.0* 8.9* 9.3*  HCT 32.8* 29.9* 29.5* 29.2*  MCV 90.6 87.9  88.1 84.6  PLT 231 222 227 [id number]   Basic Metabolic Panel: Recent Labs  Lab 08/13/19 1010 08/13/19 1023 08/14/19 0336 08/15/19 0536 08/16/19 0725  NA  --  135 131* 131* 132*  K  --  5.3* 5.6* 4.5 4.5  CL  --  90* 88* 87* 92*  CO2  --  35* 32 32 28  GLUCOSE  --  112* 257* 142* 190*  BUN  --  17 24* 28* 30*  CREATININE  --  1.21 1.19 1.20 1.13  CALCIUM  --  9.0 9.0 9.1 8.5*  MG 2.0  --   --   --   --    GFR: Estimated Creatinine Clearance: 77.7 mL/min (by C-G formula based on SCr of 1.13 mg/dL). Liver Function Tests: No results for input(s): AST, ALT, ALKPHOS, BILITOT, PROT, ALBUMIN in the last 168 hours. No results for input(s): LIPASE, AMYLASE in the last 168 hours. No results for input(s): AMMONIA in the last 168 hours. Coagulation Profile: No results for input(s): INR, PROTIME in the last 168 hours. Cardiac Enzymes: No results for input(s): CKTOTAL, CKMB, CKMBINDEX, TROPONINI in the last 168 hours. BNP (last 3 results) No results for input(s): PROBNP in the last 8760 hours. HbA1C: No results for input(s): HGBA1C in the last 72 hours. CBG: Recent Labs  Lab 08/17/19 1159 08/17/19 1629 08/17/19 2046 08/18/19 0835 08/18/19 1240  GLUCAP 124* 284* 317* 151* 144*   Lipid Profile: No results for input(s): CHOL, HDL, LDLCALC, TRIG, CHOLHDL, LDLDIRECT in the last 72 hours. Thyroid Function Tests: No results for input(s): TSH, T4TOTAL, FREET4, T3FREE, THYROIDAB in the last 72 hours. Anemia Panel: No results for input(s): VITAMINB12, FOLATE, FERRITIN, TIBC, IRON, RETICCTPCT in the last 72 hours. Sepsis Labs: No results for input(s): PROCALCITON, LATICACIDVEN in the last 168 hours.  Recent Results (from the past 240 hour(s))  SARS CORONAVIRUS 2 (TAT 6-24 HRS) Nasopharyngeal Nasopharyngeal Swab     Status: None   Collection Time: 08/13/19  2:58 PM   Specimen: Nasopharyngeal Swab  Result Value Ref Range Status   SARS Coronavirus 2 NEGATIVE NEGATIVE Final    Comment:  (NOTE) SARS-CoV-2 target nucleic acids are NOT DETECTED. The SARS-CoV-2 RNA is generally detectable in upper and lower respiratory specimens during the acute phase of infection. Negative results do not preclude SARS-CoV-2 infection, do not rule out co-infections with other pathogens, and should not be used as the sole basis for treatment or other patient management decisions. Negative results must be combined with clinical observations, patient history, and epidemiological information. The expected result is Negative. Fact Sheet for Patients: SugarRoll.be Fact Sheet for Healthcare Providers: https://www.woods-mathews.com/ This test is not yet approved or cleared by the [place] FDA and  has been authorized for detection and/or diagnosis of SARS-CoV-2 by FDA  under an Emergency Use Authorization (EUA). This EUA will remain  in effect (meaning this test can be used) for the duration of the COVID-19 declaration under Section 56 4(b)(1) of the Act, 21 U.S.C. section 360bbb-3(b)(1), unless the authorization is terminated or revoked sooner. Performed at [facility], [place] [address] [place], [place] [address]          Radiology Studies: No results found.      Scheduled Meds:  atorvastatin  40 mg Oral q1800   citalopram  20 mg Oral Daily   enoxaparin (LOVENOX) injection  40 mg Subcutaneous Q24H   furosemide  40 mg Oral BID   gabapentin  400 mg Oral QID   insulin aspart  0-5 Units Subcutaneous QHS   insulin aspart  0-9 Units Subcutaneous TID WC   insulin aspart  5 Units Subcutaneous TID WC   insulin glargine  35 Units Subcutaneous BID   ipratropium-albuterol  3 mL Nebulization TID   lisinopril  2.5 mg Oral Daily   pantoprazole  40 mg Oral BID   predniSONE  50 mg Oral Q breakfast   sodium chloride flush  10-40 mL Intracatheter Q12H   sodium chloride flush  3 mL Intravenous Q12H   tiotropium  18 mcg  Inhalation Daily   Continuous Infusions:  sodium chloride     iron sucrose 100 mg (08/17/19 1654)     LOS: 5 days    Time spent: [place]    Nicolette Bang, MD [facility]  If 7PM-7AM, please contact night-coverage  08/18/2019, 3:59 PM

## 2019-08-19 DIAGNOSIS — F319 Bipolar disorder, unspecified: Secondary | ICD-10-CM

## 2019-08-19 DIAGNOSIS — M23309 Other meniscus derangements, unspecified meniscus, unspecified knee: Secondary | ICD-10-CM

## 2019-08-19 LAB — GLUCOSE, CAPILLARY
Glucose-Capillary: 163 mg/dL — ABNORMAL HIGH (ref 70–99)
Glucose-Capillary: 106 mg/dL — ABNORMAL HIGH (ref 70–99)

## 2019-08-19 MED ORDER — HYDROCODONE-ACETAMINOPHEN 5-325 MG PO TABS: 1.0000 | ORAL_TABLET | ORAL | 0 refills | Status: DC | PRN

## 2019-08-19 MED ORDER — PREDNISONE 10 MG (21) PO TBPK: ORAL_TABLET | ORAL | 0 refills | Status: DC

## 2019-08-19 MED ORDER — ALPRAZOLAM 0.25 MG PO TABS: 0.2500 mg | ORAL_TABLET | Freq: Three times a day (TID) | ORAL | 0 refills | Status: DC | PRN

## 2019-08-19 MED ORDER — FUROSEMIDE 40 MG PO TABS: 40.0000 mg | ORAL_TABLET | Freq: Two times a day (BID) | ORAL | 0 refills | Status: DC

## 2019-08-19 MED ORDER — IPRATROPIUM-ALBUTEROL 0.5-2.5 (3) MG/3ML IN SOLN: 3.0000 mL | Freq: Four times a day (QID) | RESPIRATORY_TRACT | Status: DC | PRN

## 2019-08-19 NOTE — Plan of Care (Signed)
  Problem: Education: Goal: Knowledge of General Education information will improve Description: Including pain rating scale, medication(s)/side effects and non-pharmacologic comfort measures Outcome: Progressing   Problem: Pain Managment: Goal: General experience of comfort will improve Outcome: Progressing   

## 2019-08-19 NOTE — Discharge Instructions (Signed)
Weakness °Weakness is a lack of strength. You may feel weak all over your body (generalized), or you may feel weak in one specific part of your body (focal). Common causes of weakness include: °· Infection and immune system disorders. °· Physical exhaustion. °· Internal bleeding or other blood loss that results in a lack of red blood cells (anemia). °· Dehydration. °· An imbalance in mineral (electrolyte) levels, such as potassium. °· Heart disease, circulation problems, or stroke. °Other causes include: °· Some medicines or cancer treatment. °· Stress, anxiety, or depression. °· Nervous system disorders. °· Thyroid disorders. °· Loss of muscle strength because of age or inactivity. °· Poor sleep quality or sleep disorders. °The cause of your weakness may not be known. Some causes of weakness can be serious, so it is important to see your health care provider. °Follow these instructions at home: °Activity °· Rest as needed. °· Try to get enough sleep. Most adults need 7-8 hours of quality sleep each night. Talk to your health care provider about how much sleep you need each night. °· Do exercises, such as arm curls and leg raises, for 30 minutes at least 2 days a week or as told by your health care provider. This helps build muscle strength. °· Consider working with a physical therapist or trainer who can develop an exercise plan to help you gain muscle strength. °General instructions ° °· Take over-the-counter and prescription medicines only as told by your health care provider. °· Eat a healthy, well-balanced diet. This includes: °? Proteins to build muscles, such as lean meats and fish. °? Fresh fruits and vegetables. °? Carbohydrates to boost energy, such as whole grains. °· Drink enough fluid to keep your urine pale yellow. °· Keep all follow-up visits as told by your health care provider. This is important. °Contact a health care provider if your weakness: °· Does not improve or gets worse. °· Affects your  ability to think clearly. °· Affects your ability to do your normal daily activities. °Get help right away if you: °· Develop sudden weakness, especially on one side of your face or body. °· Have chest pain. °· Have trouble breathing or shortness of breath. °· Have problems with your vision. °· Have trouble talking or swallowing. °· Have trouble standing or walking. °· Are light-headed or lose consciousness. °Summary °· Weakness is a lack of strength. You may feel weak all over your body or just in one specific part of your body. °· Weakness can be caused by a variety of things. In some cases, the cause may be unknown. °· Rest as needed, and try to get enough sleep. Most adults need 7-8 hours of quality sleep each night. °· Eat a healthy, well-balanced diet. °This information is not intended to replace advice given to you by your health care provider. Make sure you discuss any questions you have with your health care provider. °Document Released: 10/03/2005 Document Revised: 05/09/2018 Document Reviewed: 05/09/2018 °[employer] Patient Education © 2020 [employer] ° °

## 2019-08-19 NOTE — Discharge Summary (Addendum)
Physician Discharge Summary  [name] [id number] DOB: August 11, 1941 DOA: 08/13/2019  PCP: [name], MD  Admit date: 08/13/2019 Discharge date: 08/19/2019  Admitted From: Inpatient Disposition: SNF  Recommendations for Outpatient Follow-up:  1. Follow up with PCP per receiving facility 2    palliative care/consult to follow at SNF  Home Health:No Equipment/Devices:per PT  Discharge Condition:Stable CODE STATUS:Full code Diet recommendation: Diabetic diet  Brief/Interim Summary: Per admitting history and physical HarryGassawayis a78 y.o.malewith a known history of multiple medical problems as below. The patient was recently admitted for chest pain with negative cardiac work-up. He was recommended for skilled nursing facility placement but refused. he has had worsening intermittent chest pain, shortness of breath and generalized weakness. He also complains of knee pain. The patient is treated with nebulizer and a steroid. ED physician request admission  Hospital course 1. Acute on chronic COPD exacerbation -As previously noted, clinically better -patient uses chronic home oxygen. Continue nebulizer, PO prednisone discharge taper -Chest x-ray no pneumonia. No indication for antibiotic -incentive spirometer -Patient claims chronically he is on 5 L of oxygen and has chronic baseline wheeze -Patient at baseline medically stable for discharge  2. Acute left knee pain with swelling-MCL strain -had MRI of the left knee during his last admission on October 20 showed effusion with inferior meniscal tear. Home health physical therapy was recommended. Patient tells me he is not able to bear any weight on that leg and is a high risk of fall.  He is not a safe discharge to home and will require intensive posthospitalization physical therapy for gait , mobility, balance, transfer and stength phytical therapy -X-ray of the left knee joint shows effusion. -Orthopedic Dr.  Harlow Mares has seen the patient, recommending brace to the left knee, compression, ice and elevation andrecommendedto participate in physical therapy -PRN pain meds   3. Anemia-iron deficiency Hemodynamically stable no acute bleed noticed Stool for occult blood is negative Monitored hemoglobin hematocrit closely which remained stable IV iron infusionfor 3 doses started on 08/16/2019 now complete Outpatient GI follow-up  4.Chronic diastolic heart failure with chronic lower extremity edema does not appear to be acutely volume overloaded -continue home dose of Lasix -40 mg p.o. twice daily  4. Type II diabetes continue home meds  5. Chronic normocytic anemia no active bleeding stable  6. Hyperkalemia-resolved potassium at 4.5. Resume lisinopril on d/c  7. History of bipolar disorder- psych consult placed- regarding decision making capacity to go to rehabilitation center and bipolar disorder- requested by his wife. Patient is seen by Dr. Tula Nakayama, spoke to [facility]   8. Tachycardia: Pt with tachycardia, with bigeminy noted Required inpatient monitoring with EKGs showing sinus tach as well as bigeminy   Discharge Diagnoses:  Active Problems:   COPD exacerbation [facility])    Discharge Instructions  Discharge Instructions    Call MD for:  difficulty breathing, headache or visual disturbances   Complete by: As directed    Call MD for:  extreme fatigue   Complete by: As directed    Call MD for:  hives   Complete by: As directed    Call MD for:  persistant dizziness or light-headedness   Complete by: As directed    Call MD for:  persistant nausea and vomiting   Complete by: As directed    Call MD for:  severe uncontrolled pain   Complete by: As directed    Call MD for:  temperature >100.4   Complete by: As directed    Diet -  low sodium heart healthy   Complete by: As directed    Increase activity slowly   Complete by: As directed      Allergies as of 08/19/2019       Reactions   Morphine And Related Shortness Of Breath, Nausea And Vomiting, Rash, Other (See Comments)   Reaction:  Hallucinations    Penicillins Anaphylaxis, Hives, Other (See Comments)   10/02/18 - discussed with patient and he states he can tolerate penicillin capsules and had some hives when he was 78yo and received pcn injections Has patient had a PCN reaction causing immediate rash, facial/tongue/throat swelling, SOB or lightheadedness with hypotension: Yes Has patient had a PCN reaction causing severe rash involving mucus membranes or skin necrosis: No Has patient had a PCN reaction that required hospitalization No Has patient had a PCN reaction occurring within the last 10 y   Zolpidem Shortness Of Breath   Demerol [meperidine] Other (See Comments)   Reaction:  Hallucinations     Dilaudid [hydromorphone Hcl] Other (See Comments)   Reaction:  Hallucinations    Levofloxacin Other (See Comments)   Reaction:  Unknown       Medication List    TAKE these medications   ALPRAZolam 0.25 MG tablet Commonly known as: XANAX Take 1 tablet (0.25 mg total) by mouth 3 (three) times daily as needed for anxiety or sleep.   atorvastatin 40 MG tablet Commonly known as: LIPITOR Take 1 tablet (40 mg total) by mouth daily at 6 PM.   benzonatate 100 MG capsule Commonly known as: TESSALON Take 100 mg by mouth every 8 (eight) hours as needed for cough or congestion.   citalopram 40 MG tablet Commonly known as: CELEXA Take 0.5 tablets (20 mg total) by mouth daily.   [facility] Melatonin 5 MG Tabs Generic drug: Melatonin Take 5 mg by mouth daily as needed. For sleep   fluticasone 50 MCG/ACT nasal spray Commonly known as: FLONASE Place 2 sprays into both nostrils daily. What changed:   when to take this  reasons to take this   furosemide 40 MG tablet Commonly known as: LASIX Take 1 tablet (40 mg total) by mouth 2 (two) times daily.   gabapentin 800 MG tablet Commonly known as:  NEURONTIN Take 400 mg by mouth 4 (four) times daily.   HYDROcodone-acetaminophen 5-325 MG tablet Commonly known as: NORCO/VICODIN Take 1 tablet by mouth every 4 (four) hours as needed for moderate pain.   insulin aspart 100 UNIT/ML injection Commonly known as: novoLOG Inject 25 Units into the skin 3 (three) times daily with meals.   insulin glargine 100 UNIT/ML injection Commonly known as: LANTUS Inject 0.35 mLs (35 Units total) into the skin 2 (two) times daily.   lisinopril 2.5 MG tablet Commonly known as: ZESTRIL Take 1 tablet (2.5 mg total) by mouth daily.   pantoprazole 40 MG tablet Commonly known as: PROTONIX Take 1 tablet (40 mg total) by mouth 2 (two) times daily.   polyethylene glycol 17 g packet Commonly known as: MIRALAX / GLYCOLAX Take 17 g by mouth daily as needed for mild constipation.   predniSONE 10 MG (21) Tbpk tablet Commonly known as: STERAPRED UNI-PAK 21 TAB Take per package insert   tiotropium 18 MCG inhalation capsule Commonly known as: SPIRIVA Place 18 mcg into inhaler and inhale daily.       Allergies  Allergen Reactions  . Morphine And Related Shortness Of Breath, Nausea And Vomiting, Rash and Other (See Comments)    Reaction:  Hallucinations   . Penicillins Anaphylaxis, Hives and Other (See Comments)    10/02/18 - discussed with patient and he states he can tolerate penicillin capsules and had some hives when he was 78yo and received pcn injections  Has patient had a PCN reaction causing immediate rash, facial/tongue/throat swelling, SOB or lightheadedness with hypotension: Yes Has patient had a PCN reaction causing severe rash involving mucus membranes or skin necrosis: No Has patient had a PCN reaction that required hospitalization No Has patient had a PCN reaction occurring within the last 10 y  . Zolpidem Shortness Of Breath  . Demerol [Meperidine] Other (See Comments)    Reaction:  Hallucinations    . Dilaudid [Hydromorphone Hcl]  Other (See Comments)    Reaction:  Hallucinations   . Levofloxacin Other (See Comments)    Reaction:  Unknown     Consultations:  orthopedics   Procedures/Studies: Dg Thoracic Spine 2 View  Result Date: 07/30/2019 CLINICAL DATA:  Pt c/o generalized upper and lower back pain after falling today EXAM: THORACIC SPINE 2 VIEWS COMPARISON:  Thoracic spine radiographs 04/27/2019 FINDINGS: There is no evidence of thoracic spine fracture. Alignment is normal. No other significant bone abnormalities are identified. IMPRESSION: Negative thoracic spine radiographs. Electronically Signed   By: Audie Pinto M.D.   On: 07/30/2019 21:00   Dg Lumbar Spine 2-3 Views  Result Date: 07/30/2019 CLINICAL DATA:  Back pain after a fall today EXAM: LUMBAR SPINE - 2-3 VIEW COMPARISON:  Lumbar spine radiographs 04/27/2019 FINDINGS: Alignment intact. Vertebral body heights are maintained without evidence of acute fracture. Stable mild anterior wedging of T12. Multilevel degenerative changes and lower facet arthropathy. 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 Nonobstructive bowel gas pattern. IMPRESSION: No evidence of acute fracture or static listhesis in the lumbar spine. Electronically Signed   By: Audie Pinto M.D.   On: 07/30/2019 21:02   Dg Ankle Complete Left  Result Date: 07/30/2019 CLINICAL DATA:  Pt c/o generalized upper and lower back pain and generalized left ankle pain after falling in his bathroom today. No hx of prior injuries or surgeries to any of the affected areas. EXAM: LEFT ANKLE COMPLETE - 3+ VIEW COMPARISON:  None. FINDINGS: There is no evidence of fracture, dislocation, or joint effusion. Talar dome intact. Tibiotalar joint space maintained. No focal osseous lesion. Regional soft tissues are unremarkable. IMPRESSION: No acute osseous abnormality in the left ankle. Electronically Signed   By: Audie Pinto M.D.   On: 07/30/2019 20:57   Mr Knee Left Wo Contrast  Result Date:  08/06/2019 CLINICAL DATA:  Chronic knee pain. EXAM: MRI OF THE LEFT KNEE WITHOUT CONTRAST TECHNIQUE: Multiplanar, multisequence MR imaging of the knee was performed. No intravenous contrast was administered. COMPARISON:  Radiographs 07/30/2019 FINDINGS: Examination is significantly limited by patient motion. 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 MENISCI Medial meniscus: Inferior articular surface tears along the posterior horn midbody junction region. Lateral meniscus:  Grossly intact. LIGAMENTS Cruciates:  Intact Collaterals:  Intact CARTILAGE Patellofemoral: Limited evaluation. No obvious full-thickness cartilage defects. Medial: Very limited examination. No obvious full-thickness cartilage defects. Lateral: Limited examination. No obvious full-thickness cartilage defects. Joint:  Moderate to large joint effusion and mild synovitis. Popliteal Fossa:  Moderate-sized leaking Baker's cyst. Extensor Mechanism: The patella retinacular structures are intact and the quadriceps and patellar tendons are intact. Mild proximal and distal patellar tendinopathy. Bones: Moderate edema like signal changes involving the posterior aspect of the medial tibia. This could be a bone contusion, stress reaction or  subtle subchondral stress fracture. Other: Grossly normal knee musculature. IMPRESSION: 1. Very limited examination due to patient motion. 2. Inferior articular surface tearing involving the posterior horn mid body junction region of the medial meniscus. 3. Intact ligamentous structures. 4. Suspect mild degenerative chondrosis for age but no gross cartilage defects or osteochondral lesion. 5. Edema like signal changes in the posterior aspect of the medial tibia could be a bone contusion, stress reaction or subtle subchondral stress fracture. 6. Moderate to large joint effusion and moderate-sized leaking Baker's cyst. Electronically Signed   By: Marijo Sanes M.D.   On: 08/06/2019 12:49   US Venous Img Lower  Bilateral  Result Date: 08/13/2019 CLINICAL DATA:  78 year old male with chest pain and leg swelling EXAM: BILATERAL LOWER EXTREMITY VENOUS DOPPLER ULTRASOUND TECHNIQUE: Gray-scale sonography with graded compression, as well as color Doppler and duplex ultrasound were performed to evaluate the lower extremity deep venous systems from the level of the common femoral vein and including the common femoral, femoral, profunda femoral, popliteal and calf veins including the posterior tibial, peroneal and gastrocnemius veins when visible. The superficial great saphenous vein was also interrogated. Spectral Doppler was utilized to evaluate flow at rest and with distal augmentation maneuvers in the common femoral, femoral and popliteal veins. COMPARISON:  None. FINDINGS: RIGHT LOWER EXTREMITY Common Femoral Vein: No evidence of thrombus. Normal compressibility, respiratory phasicity and response to augmentation. Saphenofemoral Junction: No evidence of thrombus. Normal compressibility and flow on color Doppler imaging. Profunda Femoral Vein: No evidence of thrombus. Normal compressibility and flow on color Doppler imaging. Femoral Vein: No evidence of thrombus. Normal compressibility, respiratory phasicity and response to augmentation. Popliteal Vein: No evidence of thrombus. Normal compressibility, respiratory phasicity and response to augmentation. Calf Veins: No evidence of thrombus. Normal compressibility and flow on color Doppler imaging. Superficial Great Saphenous Vein: No evidence of thrombus. Normal compressibility and flow on color Doppler imaging. Other Findings:  None. LEFT LOWER EXTREMITY Common Femoral Vein: No evidence of thrombus. Normal compressibility, respiratory phasicity and response to augmentation. Saphenofemoral Junction: No evidence of thrombus. Normal compressibility and flow on color Doppler imaging. Profunda Femoral Vein: No evidence of thrombus. Normal compressibility and flow on color Doppler  imaging. Femoral Vein: No evidence of thrombus. Normal compressibility, respiratory phasicity and response to augmentation. Popliteal Vein: No evidence of thrombus. Normal compressibility, respiratory phasicity and response to augmentation. Calf Veins: No evidence of thrombus. Normal compressibility and flow on color Doppler imaging. Superficial Great Saphenous Vein: No evidence of thrombus. Normal compressibility and flow on color Doppler imaging. Other Findings:  None. IMPRESSION: Sonographic survey of the bilateral lower extremities negative for DVT Electronically Signed   By: Corrie Mckusick D.O.   On: 08/13/2019 11:16   Dg Chest Portable 1 View  Result Date: 08/13/2019 CLINICAL DATA:  Chest pain EXAM: PORTABLE CHEST 1 VIEW COMPARISON:  08/04/2019 FINDINGS: Left basilar airspace opacity. Suspect small left effusion. No confluent opacity on the right. Heart is mildly enlarged. Prior CABG. [place] again noted in the left upper lobe/apex. IMPRESSION: Left lower lobe atelectasis or infiltrate with small left effusion. Findings similar to prior study. Electronically Signed   By: Rolm Baptise M.D.   On: 08/13/2019 10:18   Dg Chest Portable 1 View  Result Date: 08/04/2019 CLINICAL DATA:  Left-sided chest pain. EXAM: PORTABLE CHEST 1 VIEW COMPARISON:  07/05/2019; 06/24/2019 FINDINGS: Examination is degraded due to patient body habitus and portable technique. Grossly unchanged enlarged cardiac silhouette and mediastinal contours with atherosclerotic plaque within the  aortic arch. Post median sternotomy and CABG. Unchanged left basilar heterogeneous/consolidative opacities. The lungs remain hyperexpanded with flattening the bilaterally diaphragms mild diffuse slightly nodular thickening the pulmonary interstitium. Mild pulmonary venous congestion without frank evidence of edema. No new focal airspace opacities. No acute osseous abnormalities. IMPRESSION: Similar findings of cardiomegaly, pulmonary venous congestion  and bibasilar atelectasis/scar, left greater than right, without definitive superimposed acute cardiopulmonary disease on this AP portable examination. Electronically Signed   By: Sandi Mariscal M.D.   On: 08/04/2019 05:30   Dg Knee Complete 4 Views Left  Result Date: 08/14/2019 CLINICAL DATA:  Left knee pain. EXAM: LEFT KNEE - COMPLETE 4+ VIEW COMPARISON:  Left knee MRI 08/06/2019 and radiographs 07/30/2019 FINDINGS: A small to moderate-sized knee joint effusion is evident, not apparent on the prior radiographs although a joint effusion was seen on the interval MRI. No acute fracture or dislocation is identified. Joint space widths are preserved. There is mild diffuse subcutaneous edema. IMPRESSION: Knee joint effusion without acute osseous abnormality identified. Electronically Signed   By: Logan Bores M.D.   On: 08/14/2019 11:23   Dg Knee Complete 4 Views Left  Result Date: 07/30/2019 CLINICAL DATA:  Fall, left knee pain. EXAM: LEFT KNEE - COMPLETE 4+ VIEW COMPARISON:  None. FINDINGS: No acute bony abnormality. Specifically, no fracture, subluxation, or dislocation. Joint spaces maintained. No joint effusion. Vascular calcifications noted. IMPRESSION: No acute bony abnormality. Electronically Signed   By: Rolm Baptise M.D.   On: 07/30/2019 16:43   Ct No Charge  Result Date: 08/14/2019 CLINICAL DATA:  Inpatient.  Dyspnea. EXAM: CT ADDITIONAL VIEWS AT NO CHARGE TECHNIQUE: Chest CT angiogram attempted. Reported 50 cc Omnipaque 350 left forearm extravasation. CONTRAST:  50 cc Omnipaque 350 IV. COMPARISON:  None. FINDINGS: Scout topogram demonstrates intact sternotomy wires and mild cardiomegaly. Axial contrast monitoring images through the great vessels demonstrate atherosclerotic aneurysmal ascending thoracic aorta with 4.5 cm diameter. IMPRESSION: 1. Reported 50 cc Omnipaque 350 left forearm extravasation. Unable to re-establish peripheral IV access. Patient returned to the inpatient floor. No  diagnostic chest CT angiogram images were obtained. 2. Mild cardiomegaly visualized on the CT scout topogram. 3. 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 Ascending thoracic aortic aneurysm. Recommend semi-annual imaging followup by CTA or MRA and referral to cardiothoracic surgery if not already obtained. This recommendation follows 2010 ACCF/AHA/AATS/ACR/ASA/SCA/SCAI/SIR/STS/SVM Guidelines for the Diagnosis and Management of Patients With Thoracic Aortic Disease. Circulation. 2010; [id number]. Aortic aneurysm NOS (ICD10-I71.9). Electronically Signed   By: Ilona Sorrel M.D.   On: 08/14/2019 09:48   Dg Hips Bilat W Or Wo Pelvis 3-4 Views  Result Date: 07/30/2019 CLINICAL DATA:  Bilateral hip pain, knee pain EXAM: DG HIP (WITH OR WITHOUT PELVIS) 3-4V BILAT COMPARISON:  None. FINDINGS: Status post stent graft and vascular coiling within the pelvis. Mild degenerative changes in the hips bilaterally, left greater than right with joint space narrowing and early spurring. No acute bony abnormality. Specifically, no fracture, subluxation, or dislocation. IMPRESSION: No acute bony abnormality. Electronically Signed   By: Rolm Baptise M.D.   On: 07/30/2019 16:38       Subjective:   Discharge Exam: Vitals:   08/19/19 0357 08/19/19 0845  BP: 128/70 128/70  Pulse: 88 92  Resp: 19 19  Temp: (!) 97.5 F (36.4 C) 98.6 F (37 C)  SpO2: 100% 94%   Vitals:   08/18/19 0806 08/18/19 1936 08/19/19 0357 08/19/19 0845  BP:  120/63 129/71 128/70 128/70  Pulse: 91 (!) 106 88 92  Resp: 19  19 19   Temp: (!) 96.3 F (35.7 C) 97.9 F (36.6 C) (!) 97.5 F (36.4 C) 98.6 F (37 C)  TempSrc: Axillary Oral Oral Oral  SpO2: 99% 99% 100% 94%  Weight:   127.1 kg   Height:        General: Pt is alert, awake, not in acute distress Cardiovascular: RRR, S1/S2 +, no rubs, no gallops Respiratory: CTA bilaterally, no wheezing, no  rhonchi Abdominal: Soft, NT, ND, bowel sounds + Extremities: no edema, no cyanosis    The results of significant diagnostics from this hospitalization (including imaging, microbiology, ancillary and laboratory) are listed below for reference.     Microbiology: Recent Results (from the past 240 hour(s))  SARS CORONAVIRUS 2 (TAT 6-24 HRS) Nasopharyngeal Nasopharyngeal Swab     Status: None   Collection Time: 08/13/19  2:58 PM   Specimen: Nasopharyngeal Swab  Result Value Ref Range Status   SARS Coronavirus 2 NEGATIVE NEGATIVE Final    Comment: (NOTE) SARS-CoV-2 target nucleic acids are NOT DETECTED. The SARS-CoV-2 RNA is generally detectable in upper and lower respiratory specimens during the acute phase of infection. Negative results do not preclude SARS-CoV-2 infection, do not rule out co-infections with other pathogens, and should not be used as the sole basis for treatment or other patient management decisions. Negative results must be combined with clinical observations, patient history, and epidemiological information. The expected result is Negative. Fact Sheet for Patients: SugarRoll.be Fact Sheet for Healthcare Providers: https://www.woods-mathews.com/ This test is not yet approved or cleared by the [place] FDA and  has been authorized for detection and/or diagnosis of SARS-CoV-2 by FDA under an Emergency Use Authorization (EUA). This EUA will remain  in effect (meaning this test can be used) for the duration of the COVID-19 declaration under Section 56 4(b)(1) of the Act, 21 U.S.C. section 360bbb-3(b)(1), unless the authorization is terminated or revoked sooner. Performed at [facility], [place] [address] [place], [place] [address]      Labs: BNP (last 3 results) Recent Labs    07/05/19 1953 08/05/19 0931 08/13/19 1010  BNP 455.1* 93.0 [id number]   Basic Metabolic Panel: Recent Labs  Lab 08/13/19 1010  08/13/19 1023 08/14/19 0336 08/15/19 0536 08/16/19 0725  NA  --  135 131* 131* 132*  K  --  5.3* 5.6* 4.5 4.5  CL  --  90* 88* 87* 92*  CO2  --  35* 32 32 28  GLUCOSE  --  112* 257* 142* 190*  BUN  --  17 24* 28* 30*  CREATININE  --  1.21 1.19 1.20 1.13  CALCIUM  --  9.0 9.0 9.1 8.5*  MG 2.0  --   --   --   --    Liver Function Tests: No results for input(s): AST, ALT, ALKPHOS, BILITOT, PROT, ALBUMIN in the last 168 hours. No results for input(s): LIPASE, AMYLASE in the last 168 hours. No results for input(s): AMMONIA in the last 168 hours. CBC: Recent Labs  Lab 08/13/19 1023 08/14/19 0336 08/15/19 0536 08/16/19 0725  WBC 9.4 6.8 8.6 9.6  HGB 9.5* 9.0* 8.9* 9.3*  HCT 32.8* 29.9* 29.5* 29.2*  MCV 90.6 87.9 88.1 84.6  PLT 231 222 227 221   Cardiac Enzymes: No results for input(s): CKTOTAL, CKMB, CKMBINDEX, TROPONINI in the last 168 hours. BNP: Invalid input(s): POCBNP CBG: Recent Labs  Lab 08/18/19 0835 08/18/19 1240  08/18/19 1751 08/18/19 2101 08/19/19 0846  GLUCAP 151* 144* 270* 255* 163*   D-Dimer No results for input(s): DDIMER in the last 72 hours. Hgb A1c No results for input(s): HGBA1C in the last 72 hours. Lipid Profile No results for input(s): CHOL, HDL, LDLCALC, TRIG, CHOLHDL, LDLDIRECT in the last 72 hours. Thyroid function studies No results for input(s): TSH, T4TOTAL, T3FREE, THYROIDAB in the last 72 hours.  Invalid input(s): FREET3 Anemia work up No results for input(s): VITAMINB12, FOLATE, FERRITIN, TIBC, IRON, RETICCTPCT in the last 72 hours. Urinalysis    Component Value Date/Time   COLORURINE COLORLESS (A) 08/13/2019 1125   APPEARANCEUR CLEAR (A) 08/13/2019 1125   LABSPEC 1.005 08/13/2019 1125   PHURINE 7.0 08/13/2019 1125   GLUCOSEU NEGATIVE 08/13/2019 1125   HGBUR NEGATIVE 08/13/2019 1125   BILIRUBINUR NEGATIVE 08/13/2019 1125   BILIRUBINUR small 02/13/2015 1354   KETONESUR NEGATIVE 08/13/2019 1125   PROTEINUR NEGATIVE 08/13/2019  1125   UROBILINOGEN 0.2 02/13/2015 1354   UROBILINOGEN 0.2 08/24/2014 1948   NITRITE NEGATIVE 08/13/2019 1125   LEUKOCYTESUR NEGATIVE 08/13/2019 1125   Sepsis Labs Invalid input(s): PROCALCITONIN,  WBC,  LACTICIDVEN Microbiology Recent Results (from the past 240 hour(s))  SARS CORONAVIRUS 2 (TAT 6-24 HRS) Nasopharyngeal Nasopharyngeal Swab     Status: None   Collection Time: 08/13/19  2:58 PM   Specimen: Nasopharyngeal Swab  Result Value Ref Range Status   SARS Coronavirus 2 NEGATIVE NEGATIVE Final    Comment: (NOTE) SARS-CoV-2 target nucleic acids are NOT DETECTED. The SARS-CoV-2 RNA is generally detectable in upper and lower respiratory specimens during the acute phase of infection. Negative results do not preclude SARS-CoV-2 infection, do not rule out co-infections with other pathogens, and should not be used as the sole basis for treatment or other patient management decisions. Negative results must be combined with clinical observations, patient history, and epidemiological information. The expected result is Negative. Fact Sheet for Patients: SugarRoll.be Fact Sheet for Healthcare Providers: https://www.woods-mathews.com/ This test is not yet approved or cleared by the [place] FDA and  has been authorized for detection and/or diagnosis of SARS-CoV-2 by FDA under an Emergency Use Authorization (EUA). This EUA will remain  in effect (meaning this test can be used) for the duration of the COVID-19 declaration under Section 56 4(b)(1) of the Act, 21 U.S.C. section 360bbb-3(b)(1), unless the authorization is terminated or revoked sooner. Performed at [facility], [name] [address] [place], [place] [address]      Time coordinating discharge: Over 30 minutes  SIGNED:   Nicolette Bang, MD  [facility] 08/19/2019, 11:38 AM Pager   If 7PM-7AM, please contact night-coverage [email or web address] Password  [email or web address]

## 2019-08-19 NOTE — Care Management Important Message (Signed)
Important Message  Patient Details  Name: ZAYAAN SANGHERA MRN: [id number] Date of Birth: [birth year]   Medicare Important Message Given:  Yes  Patient stated he would like to decline receiving further copies of the Medicare IM during remainder of his visit.     Dannette Barbara 08/19/2019, 11:46 AM

## 2019-08-19 NOTE — TOC Progression Note (Signed)
Transition of Care [facility]) - Progression Note    Patient Details  Name: DONTAY BURDELL MRN: [id number] Date of Birth: [birth year]  Transition of Care [facility]) CM/SW Contact  Ross Ludwig, [place] Phone Number: 08/19/2019, 10:30 AM  Clinical Narrative:     CSW spoke to [employer], his authorization number is [id number].  CSW attempted to contact Neoma Laming at [facility] left a message on voice mail, waiting to find out if she she received insurance verification yet, CSW awaiting for call back.   Expected Discharge Plan: [place] Barriers to Discharge: Continued Medical Work up  Expected Discharge Plan and Services Expected Discharge Plan: [place] In-house Referral: Clinical Social Work     Living arrangements for the past 2 months: Single Family Home                 DME Arranged: N/A DME Agency: NA     Representative spoke with at DME Agency: na   [place]: Well [place] Date [place]: 08/15/19 Time [place]: 1200 Representative spoke with at [place]: [place] ([place]) Interventions    Readmission Risk Interventions Readmission Risk Prevention Plan 08/15/2019 07/08/2019 04/13/2019  Transportation Screening Complete Complete Complete  Medication Review Press photographer) Referral to Pharmacy Complete Complete  PCP or Specialist appointment within 3-5 days of discharge Complete Complete Complete  HRI or Home Care Consult Complete Complete Complete  SW Recovery Care/Counseling Consult - Complete Complete  Palliative Care Screening Complete Patient Refused Patient [place] Complete Not Applicable Patient Refused  Some recent data might be hidden

## 2019-08-19 NOTE — Progress Notes (Signed)
Called and gave report to the receiving facility nurse at this time. All questions answered. Lissandra Keil M Nola Botkins  Called [facility] and requested non-emergent patient transport to the receiving facility at this time. Awaiting arrival. Payton Moder M Abron Neddo 

## 2019-08-19 NOTE — TOC Transition Note (Addendum)
Transition of Care [facility]) - CM/SW Discharge Note   Patient Details  Name: [name] MRN: [id number] Date of Birth: Aug 11, 1941  Transition of Care [facility] [address]) CM/SW Contact:  Ross Ludwig, LCSW Phone Number: 08/19/2019, 11:50 AM   Clinical Narrative:     CSW contacted patient's wife that he will be discharging to SNF today, and insurance has been approved.  Patient to be d/c'ed today to [facility].  Patient and family agreeable to plans will transport via ems RN to call report to [phone].  Patient will be followed by palliative at SNF as well.  CSW discussed with patient and his wife, to work on trying to apply for Medicaid.  Patient informed this CSW that he is on a CPAP at night, CSW spoke to Neoma Laming at [facility], and she spoke to patient's wife, she will bring his CPAP from home.  Patient asked how he will to facility, CSW informed him via EMS.  Final next level of care: Skilled Nursing Facility Barriers to Discharge: Barriers Resolved   Patient Goals and CMS Choice Patient states their goals for this hospitalization and ongoing recovery are:: To go to SNF for rehab, then potentially return back home. CMS Medicare.gov Compare Post Acute Care list provided to:: Patient Choice offered to / list presented to : Patient  Discharge Placement  Patient will be discharging to [facility] today for short term rehab.   Existing PASRR number confirmed : 08/15/19          Patient chooses bed at: [facility] Patient to be transferred to facility by: [facility] Name of family member notified: Patient's wife [name] Patient and family notified of of transfer: 08/19/19  Discharge Plan and Services In-house Referral: Clinical Social Work              DME Arranged: N/A DME Agency: NA     Representative spoke with at DME Agency: [place]: Well [place] Date [place]: 08/15/19 Time [place]:  1200 Representative spoke with at [place]: [place] ([place]) Interventions     Readmission Risk Interventions Readmission Risk Prevention Plan 08/15/2019 07/08/2019 04/13/2019  Transportation Screening Complete Complete Complete  Medication Review Press photographer) Referral to Pharmacy Complete Complete  PCP or Specialist appointment within 3-5 days of discharge Complete Complete Complete  HRI or Home Care Consult Complete Complete Complete  SW Recovery Care/Counseling Consult - Complete Complete  Palliative Care Screening Complete Patient Refused Patient [place] Complete Not Applicable Patient Refused  Some recent data might be hidden

## 2019-08-21 ENCOUNTER — Telehealth: Payer: Self-pay

## 2019-08-21 NOTE — Telephone Encounter (Signed)
err

## 2019-08-27 ENCOUNTER — Telehealth: Payer: Self-pay | Admitting: Pulmonary Disease

## 2019-08-27 NOTE — Telephone Encounter (Signed)
Spoke with Dr. Joaquin Courts, advised that we have not seen patient in clinic since 10/2016 so I could not verify the accuracy of his current med list.  Dr. Joaquin Courts expressed understanding.  Nothing further needed at this time- will close encounter.

## 2019-08-28 ENCOUNTER — Other Ambulatory Visit: Payer: Self-pay

## 2019-08-28 ENCOUNTER — Non-Acute Institutional Stay: Payer: Medicare HMO | Admitting: Primary Care

## 2019-08-30 ENCOUNTER — Other Ambulatory Visit: Payer: Self-pay

## 2019-08-30 ENCOUNTER — Non-Acute Institutional Stay: Payer: Medicare HMO | Admitting: Primary Care

## 2019-08-30 DIAGNOSIS — Z515 Encounter for palliative care: Secondary | ICD-10-CM

## 2019-08-30 NOTE — Progress Notes (Signed)
[facility]er[facility] Palliative Care Consult Note Telephone: [phone]  Fax: [phone]  TELEHEALTH VISIT STATEMENT Due to the COVID-19 crisis, this visit was done via telemedicine from my office. It was initiated and consented to by this patient and/or family.  PATIENT NAME: [name] [address] [place] [address] [phone] (home)  DOB: March 18, 1941 MRN: [id number]  PRIMARY CARE PROVIDER:  Dr. Merlene Pulling  REFERRING PROVIDER: Dr Briant SitesHenderson [place]  RESPONSIBLE PARTY:   Extended Emergency Contact Information Primary Emergency Contact: [place] Address: [address] [place], [place] [address] [place] Mobile Phone: [phone] Relation: Spouse   ASSESSMENT AND RECOMMENDATIONS:   1. Advance Care Planning/Goals of Care: Goals include to maximize quality of life and symptom management.Patient is a home patient of palliative care. I am seeing him for the first time in the SNF. I have also spoken to his wife RE his care needs.goals of care include full scope of interventions including resuscitation, ICU admission, antibiotics, iv and feeding tube.   2. Symptom Management:   Pain: States soreness in groin from stents. Also has knee pain, uses ace wrap on knee for pain control and this is helping. He states exercises have made him hurt and he does not like this.   Mobility: States his baseline at home is very minimal walking at home, states he stays in bed most of the day at home. He states he has no steps at his home and states he does not need to learn to do steps. Record shows he's fallen many times at home with subsequent trips to ED. States he wants a rollator and will get it from [place].   Dyspnea: Gets DOE with toileting or getting upset. States when he leans over to clean  himself he has duodenal ulcers that start bleeding. Not clear what he means by this statement. Has a CPAP at home and uses oxygen at  the SNF. We were discussing if he has a trilogy, but he was not sure.  3. Family /Caregiver/Community Supports: Lives with wife whom I interviewed. She is overwhelmed with his care. He stays in bed most of the time and she feels he does not help himself enough with his care. He is in SNF for rehab but states he feels he can do what he needs to at home.  4. Cognitive / Functional decline: Alert and oriented x 3. At functional baseline by his statement.   5. Follow up Palliative Care Visit: Palliative care will continue to follow for goals of care clarification and symptom management. Return 6 weeks or prn.  I spent 35 minutes providing this consultation,  from 1315 to 1350. More than 50% of the time in this consultation was spent coordinating communication.   HISTORY OF PRESENT ILLNESS:  ASENCION [name] is a 78 y.o. year old male with multiple medical problems including CAD, COPD, DM, obesity, depression and anxiety. Palliative Care was asked to follow this patient by consultation request of Dr Briant SitesHenderson [place] to help address advance care planning and goals of care. This is an initial visit.  CODE STATUS: FULL CODE and full scope of care  PPS: 30% HOSPICE ELIGIBILITY/DIAGNOSIS: TBD  PAST MEDICAL HISTORY:  Past Medical History:  Diagnosis Date  . AAA (abdominal aortic aneurysm) ([place])    a. 12/2008 s/p 7cm, endovascular repair with coiling right hypogastric artery   . Acute [place] ulcer   .  Adenomatous duodenal polyp   . Allergic rhinitis, cause unspecified   . Anxiety   . AVM (arteriovenous malformation) of colon with hemorrhage   . Bipolar 1 disorder, mixed, moderate ([place]) 04/16/2015  . CAD (coronary artery disease)    a. 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  . Chronic diastolic CHF (congestive heart failure) ([place])    a. 04/2015 Echo: EF 55-60%, no rwma, Gr 1 DD, mild AI.  Marland Kitchen Complication of anesthesia    "if they sedate me for too long, they have to  intubate me; then they can't get me to come out of it" (04/03/2017)  . COPD (chronic obstructive pulmonary disease) ([place])    a. GOLD stage IV, started home O2. Severe bullous disease of LUL. Prolonged intubation after surgeries due to COPD.  Marland Kitchen Depression with anxiety 01/14/2013  . Diabetes mellitus with complication ([place])   . Diverticulosis   . Duodenal diverticulum   . Duodenal ulcer   . Emphysema of lung ([place])   . Esophagitis   . Essential hypertension 08/18/2009   Qualifier: Diagnosis of  By: Doy Mince LPN, Megan    . GERD (gastroesophageal reflux disease)   . GI bleed requiring more than 4 units of blood in 24 hours, ICU, or surgery    a. Hx bleeding gastric polyps, cecal & sigmoid AVMS s/p APC 03/30/14  . Hiatal hernia    large  . History of blood transfusion    "many many many; related to blood loss; anemia"  . Hyperlipidemia   . Insomnia 08/10/2014  . Leucocytosis 12/04/2013  . Major depressive disorder   . Memory loss   . Morbid obesity ([place])   . Multiple gastric polyps   . Myocardial infarction ([place])    "I think I had a minor one when I had the OHS"  . On home oxygen therapy    "7 liters [place] w/oxigenator" (04/03/2017)  . Pneumonia 2017  . Recurrent Microcytic Anemia    a. presumed chronic GI blood loss.  . Type II diabetes mellitus ([place])   . Vitamin D deficiency 08/10/2014    SOCIAL HX:  Social History   Tobacco Use  . Smoking status: Former Smoker    Packs/day: 2.00    Years: 50.00    Pack years: 100.00    Types: Cigarettes    Quit date: 11/18/2008    Years since quitting: 10.7  . Smokeless tobacco: Never Used  Substance Use Topics  . Alcohol use: No    Alcohol/week: 0.0 standard drinks    Comment: quit in ~ 2010    ALLERGIES:  Allergies  Allergen Reactions  . Morphine And Related Shortness Of Breath, Nausea And Vomiting, Rash and Other (See Comments)    Reaction:  Hallucinations   . Penicillins Anaphylaxis, Hives and Other (See Comments)    10/02/18 -  discussed with patient and he states he can tolerate penicillin capsules and had some hives when he was 78yo and received pcn injections  Has patient had a PCN reaction causing immediate rash, facial/tongue/throat swelling, SOB or lightheadedness with hypotension: Yes Has patient had a PCN reaction causing severe rash involving mucus membranes or skin necrosis: No Has patient had a PCN reaction that required hospitalization No Has patient had a PCN reaction occurring within the last 10 y  . Zolpidem Shortness Of Breath  . Demerol [Meperidine] Other (See Comments)    Reaction:  Hallucinations    . Dilaudid [Hydromorphone Hcl] Other (See Comments)  Reaction:  Hallucinations   . Levofloxacin Other (See Comments)    Reaction:  Unknown      PERTINENT MEDICATIONS:  Outpatient Encounter Medications as of 08/30/2019  Medication Sig  . ALPRAZolam (XANAX) 0.25 MG tablet Take 1 tablet (0.25 mg total) by mouth 3 (three) times daily as needed for anxiety or sleep.  Marland Kitchen atorvastatin (LIPITOR) 40 MG tablet Take 1 tablet (40 mg total) by mouth daily at 6 PM.  . benzonatate (TESSALON) 100 MG capsule Take 100 mg by mouth every 8 (eight) hours as needed for cough or congestion.  . citalopram (CELEXA) 40 MG tablet Take 0.5 tablets (20 mg total) by mouth daily.  . CVS MELATONIN 5 MG TABS Take 5 mg by mouth daily as needed. For sleep  . fluticasone (FLONASE) 50 MCG/ACT nasal spray Place 2 sprays into both nostrils daily. (Patient taking differently: Place 2 sprays into both nostrils daily as needed for allergies or rhinitis. )  . furosemide (LASIX) 40 MG tablet Take 1 tablet (40 mg total) by mouth 2 (two) times daily.  Marland Kitchen gabapentin (NEURONTIN) 800 MG tablet Take 400 mg by mouth 4 (four) times daily.  Marland Kitchen HYDROcodone-acetaminophen (NORCO/VICODIN) 5-325 MG tablet Take 1 tablet by mouth every 4 (four) hours as needed for moderate pain.  Marland Kitchen insulin aspart (NOVOLOG) 100 UNIT/ML injection Inject 25 Units into the skin 3  (three) times daily with meals.  . insulin glargine (LANTUS) 100 UNIT/ML injection Inject 0.35 mLs (35 Units total) into the skin 2 (two) times daily.  Marland Kitchen lisinopril (ZESTRIL) 2.5 MG tablet Take 1 tablet (2.5 mg total) by mouth daily.  . pantoprazole (PROTONIX) 40 MG tablet Take 1 tablet (40 mg total) by mouth 2 (two) times daily.  . polyethylene glycol (MIRALAX / GLYCOLAX) 17 g packet Take 17 g by mouth daily as needed for mild constipation.  . predniSONE (STERAPRED UNI-PAK 21 TAB) 10 MG (21) TBPK tablet Take per package insert  . tiotropium (SPIRIVA) 18 MCG inhalation capsule Place 18 mcg into inhaler and inhale daily.   No facility-administered encounter medications on file as of 08/30/2019.     PHYSICAL EXAM / ROS:   Current and past weights: States 280 lbs and is 6'3".  General: NAD, frail appearing, obese Cardiovascular: no chest pain reported, no edema  Pulmonary: no cough, no increased SOB, DOE Abdomen: appetite good, denies constipation, continent of bowel GU: denies dysuria, continent of urine MSK:  Joint pain, min ambulatory with walker, frequent falls. Skin: no rashes or wounds reported Neurological: Weakness, alert and oriented x 3.   Jason Coop, NP

## 2019-09-05 ENCOUNTER — Observation Stay
Admission: EM | Admit: 2019-09-05 | Discharge: 2019-09-06 | Disposition: A | Discharge status: Home / Self Care | Payer: Medicare HMO | Attending: Internal Medicine | Admitting: Internal Medicine

## 2019-09-05 ENCOUNTER — Emergency Department: Payer: Medicare HMO

## 2019-09-05 ENCOUNTER — Other Ambulatory Visit: Payer: Self-pay

## 2019-09-05 ENCOUNTER — Encounter: Payer: Self-pay | Admitting: Emergency Medicine

## 2019-09-05 DIAGNOSIS — J9602 Acute respiratory failure with hypercapnia: Secondary | ICD-10-CM | POA: Diagnosis not present

## 2019-09-05 DIAGNOSIS — J984 Other disorders of lung: Secondary | ICD-10-CM

## 2019-09-05 DIAGNOSIS — Z79899 Other long term (current) drug therapy: Secondary | ICD-10-CM | POA: Insufficient documentation

## 2019-09-05 DIAGNOSIS — Z951 Presence of aortocoronary bypass graft: Secondary | ICD-10-CM | POA: Insufficient documentation

## 2019-09-05 DIAGNOSIS — Z515 Encounter for palliative care: Secondary | ICD-10-CM | POA: Diagnosis not present

## 2019-09-05 DIAGNOSIS — D631 Anemia in chronic kidney disease: Secondary | ICD-10-CM | POA: Diagnosis not present

## 2019-09-05 DIAGNOSIS — Z9119 Patient's noncompliance with other medical treatment and regimen: Secondary | ICD-10-CM | POA: Insufficient documentation

## 2019-09-05 DIAGNOSIS — N189 Chronic kidney disease, unspecified: Secondary | ICD-10-CM | POA: Diagnosis not present

## 2019-09-05 DIAGNOSIS — J9621 Acute and chronic respiratory failure with hypoxia: Secondary | ICD-10-CM

## 2019-09-05 DIAGNOSIS — I2510 Atherosclerotic heart disease of native coronary artery without angina pectoris: Secondary | ICD-10-CM | POA: Insufficient documentation

## 2019-09-05 DIAGNOSIS — E785 Hyperlipidemia, unspecified: Secondary | ICD-10-CM | POA: Diagnosis not present

## 2019-09-05 DIAGNOSIS — Z7189 Other specified counseling: Secondary | ICD-10-CM | POA: Diagnosis not present

## 2019-09-05 DIAGNOSIS — J90 Pleural effusion, not elsewhere classified: Secondary | ICD-10-CM | POA: Diagnosis not present

## 2019-09-05 DIAGNOSIS — J441 Chronic obstructive pulmonary disease with (acute) exacerbation: Secondary | ICD-10-CM | POA: Diagnosis not present

## 2019-09-05 DIAGNOSIS — I5033 Acute on chronic diastolic (congestive) heart failure: Secondary | ICD-10-CM

## 2019-09-05 DIAGNOSIS — Z9981 Dependence on supplemental oxygen: Secondary | ICD-10-CM | POA: Diagnosis not present

## 2019-09-05 DIAGNOSIS — K219 Gastro-esophageal reflux disease without esophagitis: Secondary | ICD-10-CM | POA: Diagnosis not present

## 2019-09-05 DIAGNOSIS — F17210 Nicotine dependence, cigarettes, uncomplicated: Secondary | ICD-10-CM | POA: Insufficient documentation

## 2019-09-05 DIAGNOSIS — J449 Chronic obstructive pulmonary disease, unspecified: Secondary | ICD-10-CM

## 2019-09-05 DIAGNOSIS — F319 Bipolar disorder, unspecified: Secondary | ICD-10-CM | POA: Diagnosis not present

## 2019-09-05 DIAGNOSIS — G4733 Obstructive sleep apnea (adult) (pediatric): Secondary | ICD-10-CM

## 2019-09-05 DIAGNOSIS — Z7951 Long term (current) use of inhaled steroids: Secondary | ICD-10-CM | POA: Diagnosis not present

## 2019-09-05 DIAGNOSIS — E1142 Type 2 diabetes mellitus with diabetic polyneuropathy: Secondary | ICD-10-CM | POA: Insufficient documentation

## 2019-09-05 DIAGNOSIS — I252 Old myocardial infarction: Secondary | ICD-10-CM | POA: Insufficient documentation

## 2019-09-05 DIAGNOSIS — I4510 Unspecified right bundle-branch block: Secondary | ICD-10-CM | POA: Insufficient documentation

## 2019-09-05 DIAGNOSIS — E1122 Type 2 diabetes mellitus with diabetic chronic kidney disease: Secondary | ICD-10-CM | POA: Insufficient documentation

## 2019-09-05 DIAGNOSIS — F419 Anxiety disorder, unspecified: Secondary | ICD-10-CM | POA: Diagnosis not present

## 2019-09-05 DIAGNOSIS — Z794 Long term (current) use of insulin: Secondary | ICD-10-CM | POA: Insufficient documentation

## 2019-09-05 DIAGNOSIS — Z9111 Patient's noncompliance with dietary regimen: Secondary | ICD-10-CM | POA: Insufficient documentation

## 2019-09-05 DIAGNOSIS — I130 Hypertensive heart and chronic kidney disease with heart failure and stage 1 through stage 4 chronic kidney disease, or unspecified chronic kidney disease: Secondary | ICD-10-CM | POA: Diagnosis not present

## 2019-09-05 DIAGNOSIS — Z20828 Contact with and (suspected) exposure to other viral communicable diseases: Secondary | ICD-10-CM | POA: Insufficient documentation

## 2019-09-05 LAB — BLOOD GAS, ARTERIAL
Acid-Base Excess: 8.6 mmol/L — ABNORMAL HIGH (ref 0.0–2.0)
Bicarbonate: 37.6 mmol/L — ABNORMAL HIGH (ref 20.0–28.0)
Delivery systems: POSITIVE
Expiratory PAP: 6
FIO2: 0.45
Inspiratory PAP: 12
O2 Saturation: 93.4 %
Patient temperature: 37
RATE: 12 resp/min
pCO2 arterial: 73 mmHg (ref 32.0–48.0)
pH, Arterial: 7.32 — ABNORMAL LOW (ref 7.350–7.450)
pO2, Arterial: 74 mmHg — ABNORMAL LOW (ref 83.0–108.0)

## 2019-09-05 LAB — CBC
HCT: 30.9 % — ABNORMAL LOW (ref 39.0–52.0)
Hemoglobin: 9.4 g/dL — ABNORMAL LOW (ref 13.0–17.0)
MCH: 26.9 pg (ref 26.0–34.0)
MCHC: 30.4 g/dL (ref 30.0–36.0)
MCV: 88.3 fL (ref 80.0–100.0)
Platelets: 245 10*3/uL (ref 150–400)
RBC: 3.5 MIL/uL — ABNORMAL LOW (ref 4.22–5.81)
RDW: 16.4 % — ABNORMAL HIGH (ref 11.5–15.5)
WBC: 9.8 10*3/uL (ref 4.0–10.5)
nRBC: 0 % (ref 0.0–0.2)

## 2019-09-05 LAB — GLUCOSE, CAPILLARY
Glucose-Capillary: 147 mg/dL — ABNORMAL HIGH (ref 70–99)
Glucose-Capillary: 162 mg/dL — ABNORMAL HIGH (ref 70–99)
Glucose-Capillary: 115 mg/dL — ABNORMAL HIGH (ref 70–99)
Glucose-Capillary: 225 mg/dL — ABNORMAL HIGH (ref 70–99)
Glucose-Capillary: 235 mg/dL — ABNORMAL HIGH (ref 70–99)

## 2019-09-05 LAB — BASIC METABOLIC PANEL
Anion gap: 10 (ref 5–15)
BUN: 14 mg/dL (ref 8–23)
CO2: 35 mmol/L — ABNORMAL HIGH (ref 22–32)
Calcium: 9 mg/dL (ref 8.9–10.3)
Chloride: 89 mmol/L — ABNORMAL LOW (ref 98–111)
Creatinine, Ser: 1.06 mg/dL (ref 0.61–1.24)
GFR calc Af Amer: >60 mL/min (ref >60)
GFR calc non Af Amer: >60 mL/min (ref >60)
Glucose, Bld: 73 mg/dL (ref 70–99)
Potassium: 4.3 mmol/L (ref 3.5–5.1)
Sodium: 134 mmol/L — ABNORMAL LOW (ref 135–145)

## 2019-09-05 LAB — TROPONIN I (HIGH SENSITIVITY): Troponin I (High Sensitivity): 14 ng/L (ref <18)

## 2019-09-05 LAB — POC SARS CORONAVIRUS 2 AG
SARS Coronavirus 2 Ag: NEGATIVE
SARS Coronavirus 2 Ag: NEGATIVE

## 2019-09-05 LAB — MRSA PCR SCREENING: MRSA by PCR: NEGATIVE

## 2019-09-05 LAB — BRAIN NATRIURETIC PEPTIDE: B Natriuretic Peptide: 373 pg/mL — ABNORMAL HIGH (ref 0.0–100.0)

## 2019-09-05 MED ORDER — PANTOPRAZOLE SODIUM 40 MG PO TBEC
40.0000 mg | DELAYED_RELEASE_TABLET | Freq: Two times a day (BID) | ORAL | Status: DC
  Administered 2019-09-05 – 2019-09-06 (×3): 40 mg via ORAL
  Filled 2019-09-05 (×3): qty 1

## 2019-09-05 MED ORDER — IPRATROPIUM-ALBUTEROL 0.5-2.5 (3) MG/3ML IN SOLN
3.0000 mL | Freq: Once | RESPIRATORY_TRACT | Status: AC
  Administered 2019-09-05: 3 mL via RESPIRATORY_TRACT
  Filled 2019-09-05: qty 3

## 2019-09-05 MED ORDER — FUROSEMIDE 20 MG PO TABS
40.0000 mg | ORAL_TABLET | Freq: Two times a day (BID) | ORAL | Status: DC
  Administered 2019-09-05 – 2019-09-06 (×2): 40 mg via ORAL
  Filled 2019-09-05 (×2): qty 2

## 2019-09-05 MED ORDER — DEXTROSE 50 % IV SOLN
0.5000 | Freq: Once | INTRAVENOUS | Status: DC
  Filled 2019-09-05: qty 50

## 2019-09-05 MED ORDER — MELATONIN 5 MG PO TABS
5.0000 mg | ORAL_TABLET | Freq: Every day | ORAL | Status: DC | PRN
  Administered 2019-09-06: 5 mg via ORAL
  Filled 2019-09-05 (×3): qty 1

## 2019-09-05 MED ORDER — POLYETHYLENE GLYCOL 3350 17 G PO PACK: 17.0000 g | PACK | Freq: Every day | ORAL | Status: DC | PRN

## 2019-09-05 MED ORDER — ATORVASTATIN CALCIUM 20 MG PO TABS
40.0000 mg | ORAL_TABLET | Freq: Every day | ORAL | Status: DC
  Administered 2019-09-05: 40 mg via ORAL
  Filled 2019-09-05 (×2): qty 2

## 2019-09-05 MED ORDER — LISINOPRIL 5 MG PO TABS
2.5000 mg | ORAL_TABLET | Freq: Every day | ORAL | Status: DC
  Administered 2019-09-05 – 2019-09-06 (×2): 2.5 mg via ORAL
  Filled 2019-09-05 (×2): qty 1

## 2019-09-05 MED ORDER — ALBUTEROL SULFATE (2.5 MG/3ML) 0.083% IN NEBU: 2.5000 mg | INHALATION_SOLUTION | RESPIRATORY_TRACT | Status: DC | PRN

## 2019-09-05 MED ORDER — PROPRANOLOL HCL 20 MG PO TABS
10.0000 mg | ORAL_TABLET | Freq: Every day | ORAL | Status: DC
  Administered 2019-09-05 – 2019-09-06 (×2): 10 mg via ORAL
  Filled 2019-09-05 (×2): qty 1

## 2019-09-05 MED ORDER — BENZONATATE 100 MG PO CAPS
100.0000 mg | ORAL_CAPSULE | Freq: Three times a day (TID) | ORAL | Status: DC | PRN
  Administered 2019-09-06: 100 mg via ORAL
  Filled 2019-09-05: qty 1

## 2019-09-05 MED ORDER — METHYLPREDNISOLONE SODIUM SUCC 125 MG IJ SOLR
125.0000 mg | INTRAMUSCULAR | Status: AC
  Administered 2019-09-05: 125 mg via INTRAVENOUS
  Filled 2019-09-05: qty 2

## 2019-09-05 MED ORDER — BACITRACIN-NEOMYCIN-POLYMYXIN 400-5-5000 EX OINT
TOPICAL_OINTMENT | CUTANEOUS | Status: DC | PRN
  Administered 2019-09-06: 1 via TOPICAL
  Filled 2019-09-05 (×2): qty 1

## 2019-09-05 MED ORDER — ACETAMINOPHEN 325 MG PO TABS: 650.0000 mg | ORAL_TABLET | Freq: Four times a day (QID) | ORAL | Status: DC | PRN

## 2019-09-05 MED ORDER — CHLORHEXIDINE GLUCONATE 0.12 % MT SOLN
15.0000 mL | Freq: Two times a day (BID) | OROMUCOSAL | Status: DC
  Administered 2019-09-05 – 2019-09-06 (×2): 15 mL via OROMUCOSAL
  Filled 2019-09-05 (×2): qty 15

## 2019-09-05 MED ORDER — CITALOPRAM HYDROBROMIDE 20 MG PO TABS
20.0000 mg | ORAL_TABLET | Freq: Every day | ORAL | Status: DC
  Administered 2019-09-05 – 2019-09-06 (×2): 20 mg via ORAL
  Filled 2019-09-05 (×2): qty 1

## 2019-09-05 MED ORDER — SENNA 8.6 MG PO TABS
1.0000 | ORAL_TABLET | Freq: Two times a day (BID) | ORAL | Status: DC
  Administered 2019-09-05 – 2019-09-06 (×2): 8.6 mg via ORAL
  Filled 2019-09-05 (×2): qty 1

## 2019-09-05 MED ORDER — METHYLPREDNISOLONE SODIUM SUCC 125 MG IJ SOLR
60.0000 mg | INTRAMUSCULAR | Status: DC
  Administered 2019-09-06: 60 mg via INTRAVENOUS
  Filled 2019-09-05: qty 2

## 2019-09-05 MED ORDER — GABAPENTIN 400 MG PO CAPS
400.0000 mg | ORAL_CAPSULE | Freq: Four times a day (QID) | ORAL | Status: DC
  Administered 2019-09-05 – 2019-09-06 (×3): 400 mg via ORAL
  Filled 2019-09-05 (×3): qty 1

## 2019-09-05 MED ORDER — INSULIN GLARGINE 100 UNIT/ML ~~LOC~~ SOLN
35.0000 [IU] | Freq: Two times a day (BID) | SUBCUTANEOUS | Status: DC
  Administered 2019-09-05 – 2019-09-06 (×2): 35 [IU] via SUBCUTANEOUS
  Filled 2019-09-05 (×3): qty 0.35

## 2019-09-05 MED ORDER — INSULIN ASPART 100 UNIT/ML ~~LOC~~ SOLN
0.0000 [IU] | Freq: Every day | SUBCUTANEOUS | Status: DC
  Administered 2019-09-05: 2 [IU] via SUBCUTANEOUS
  Filled 2019-09-05: qty 1

## 2019-09-05 MED ORDER — ACETAMINOPHEN 650 MG RE SUPP: 650.0000 mg | Freq: Four times a day (QID) | RECTAL | Status: DC | PRN

## 2019-09-05 MED ORDER — INSULIN ASPART 100 UNIT/ML ~~LOC~~ SOLN
0.0000 [IU] | Freq: Three times a day (TID) | SUBCUTANEOUS | Status: DC
  Administered 2019-09-05: 3 [IU] via SUBCUTANEOUS
  Administered 2019-09-06: 9 [IU] via SUBCUTANEOUS
  Administered 2019-09-06: 5 [IU] via SUBCUTANEOUS
  Filled 2019-09-05 (×3): qty 1

## 2019-09-05 MED ORDER — CHLORHEXIDINE GLUCONATE CLOTH 2 % EX PADS
6.0000 | MEDICATED_PAD | Freq: Every day | CUTANEOUS | Status: DC
  Administered 2019-09-05: 6 via TOPICAL

## 2019-09-05 MED ORDER — ALBUTEROL SULFATE (2.5 MG/3ML) 0.083% IN NEBU
2.5000 mg | INHALATION_SOLUTION | Freq: Four times a day (QID) | RESPIRATORY_TRACT | Status: DC
  Administered 2019-09-05 – 2019-09-06 (×4): 2.5 mg via RESPIRATORY_TRACT
  Filled 2019-09-05 (×5): qty 3

## 2019-09-05 MED ORDER — FLUTICASONE PROPIONATE 50 MCG/ACT NA SUSP
2.0000 | Freq: Every day | NASAL | Status: DC | PRN
  Administered 2019-09-06: 2 via NASAL
  Filled 2019-09-05 (×2): qty 16

## 2019-09-05 MED ORDER — ENOXAPARIN SODIUM 40 MG/0.4ML ~~LOC~~ SOLN: 40.0000 mg | SUBCUTANEOUS | Status: DC

## 2019-09-05 MED ORDER — ALPRAZOLAM 0.25 MG PO TABS
0.2500 mg | ORAL_TABLET | Freq: Once | ORAL | Status: AC
  Administered 2019-09-05: 0.25 mg via ORAL
  Filled 2019-09-05: qty 1

## 2019-09-05 MED ORDER — ORAL CARE MOUTH RINSE: 15.0000 mL | Freq: Two times a day (BID) | OROMUCOSAL | Status: DC
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
## 2019-09-05 NOTE — ED Notes (Signed)
Report to Tiffany, RN 

## 2019-09-05 NOTE — ED Notes (Signed)
Date and time results received: 09/05/19 10:09 AM  (use smartphrase ".now" to insert current time)  Test: pco2 Critical Value: 31  Name of Provider Notified: Delman Kitten

## 2019-09-05 NOTE — Progress Notes (Signed)
Patient transported to ICU on Bipap. No complications. Report given to ICU RT.

## 2019-09-05 NOTE — ED Notes (Signed)
Rapid COVID POC negative.

## 2019-09-05 NOTE — ED Notes (Signed)
Reports feeling hot, fatigue, worsening SOB that began today. Pt alert when being stimulated, snoring RR when not being verbally interacted with. Pt arrived on NRB 15 L with EMS, changed back to 5L [place] at time of ER arrival-oxygen 94-96% on his home 5L [place]. Pt afebrile in ER.
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
## 2019-09-05 NOTE — Progress Notes (Signed)
Patient ID: [name], male   DOB: [birth year], 78 y.o.   MRN: [id number] patient's repeated ABG is a little better. Per RN more awake and communicative  Dr Lanney Gins will see pt

## 2019-09-05 NOTE — ED Notes (Signed)
RT called for BIPAP

## 2019-09-05 NOTE — Consult Note (Signed)
Consultation Note Date: 09/05/2019   Patient Name: [name]  DOB: 1941/02/10  MRN: [id number]  Age / Sex: 78 y.o., male  PCP: [name], MD Referring Physician: Fritzi Mandes, MD  Reason for Consultation: Establishing goals of care  HPI/Patient Profile: 78 y.o. male  with past medical history of chronic COPD, CHF, bipolar disorder, morbid obesity, DM2, chronic anemia admitted on 09/05/2019 from rehab facility with increasing SOB, lethargy and AMS.  Workup this far reveals hypercarbic respiratory failure, and chest xray shows pulmonary vascular congestion and hazy opacity with concern for effusion or possible early infectious etiology. Palliative medicine consulted for [place].   Clinical Assessment and Goals of Care: Evaluated patient in the emergency room. He is on Bipap, tells me, "I think I have CO2 build up again". When I attempt further [name] discussion it is apparent he is lethargic and unable to participate meaningfully. He is frequently mumbling intelligibly and even with stimulation cannot answer other [place] questions.  Per chart review patient has been seen by [facility] Palliative provider Estevan Oaks, NP in the community- most recently on 08/30/19 (6 days ago).  At that time- patient reported [place] for full scope interventions including full code, ICU if needed, antibiotics and feeding tube.  Per that note- pt baseline mobility is that he is in bed most days, and has history of frequent falls when ambulating.  I attempted to call his spouse- [name] to further discuss [place], left message requesting return call.   Primary Decision Maker NEXT OF KIN- spouse- [place] OF RECOMMENDATIONS -Continue full scope care -Patient has MOST form and multiple Palliative note documentations indicating his desire for Full Code and Full Scope care -Will await return call from  patient's spouse    Code Status/Advance Care Planning:  Full code  Prognosis:    Unable to determine  Discharge Planning: To Be Determined- will likely be back to SNF with Palliative  Primary Diagnoses: Present on Admission: . Acute on chronic respiratory failure with hypoxia ([place])   I have reviewed the medical record, interviewed the patient and family, and examined the patient. The following aspects are pertinent.  Past Medical History:  Diagnosis Date  . AAA (abdominal aortic aneurysm) ([place])    a. 12/2008 s/p 7cm, endovascular repair with coiling right hypogastric artery   . Acute [place] ulcer   . Adenomatous duodenal polyp   . Allergic rhinitis, cause unspecified   . Anxiety   . AVM (arteriovenous malformation) of colon with hemorrhage   . Bipolar 1 disorder, mixed, moderate ([place]) 04/16/2015  . CAD (coronary artery disease)    a. 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  . Chronic diastolic CHF (congestive heart failure) ([place])    a. 04/2015 Echo: EF 55-60%, no rwma, Gr 1 DD, mild AI.  Marland Kitchen Complication of anesthesia    "if they sedate me for too long, they have to intubate me; then they can't get me to come out of it" (04/03/2017)  .  COPD (chronic obstructive pulmonary disease) ([place])    a. GOLD stage IV, started home O2. Severe bullous disease of LUL. Prolonged intubation after surgeries due to COPD.  Marland Kitchen Depression with anxiety 01/14/2013  . Diabetes mellitus with complication ([place])   . Diverticulosis   . Duodenal diverticulum   . Duodenal ulcer   . Emphysema of lung ([place])   . Esophagitis   . Essential hypertension 08/18/2009   Qualifier: Diagnosis of  By: Doy Mince LPN, Megan    . GERD (gastroesophageal reflux disease)   . GI bleed requiring more than 4 units of blood in 24 hours, ICU, or surgery    a. Hx bleeding gastric polyps, cecal & sigmoid AVMS s/p APC 03/30/14  . Hiatal hernia    large  . History of blood transfusion    "many many many;  related to blood loss; anemia"  . Hyperlipidemia   . Insomnia 08/10/2014  . Leucocytosis 12/04/2013  . Major depressive disorder   . Memory loss   . Morbid obesity ([place])   . Multiple gastric polyps   . Myocardial infarction ([place])    "I think I had a minor one when I had the OHS"  . On home oxygen therapy    "7 liters [place] w/oxigenator" (04/03/2017)  . Pneumonia 2017  . Recurrent Microcytic Anemia    a. presumed chronic GI blood loss.  . Type II diabetes mellitus ([place])   . Vitamin D deficiency 08/10/2014   Social History   Socioeconomic History  . Marital status: Married    Spouse name: Not on file  . Number of children: Not on file  . Years of education: Not on file  . Highest education level: Not on file  Occupational History  . Occupation: Retired    Fish farm manager: RETIRED  Social Needs  . Financial resource strain: Not on file  . Food insecurity    Worry: Not on file    Inability: Not on file  . Transportation needs    Medical: Not on file    Non-medical: Not on file  Tobacco Use  . Smoking status: Former Smoker    Packs/day: 2.00    Years: 50.00    Pack years: 100.00    Types: Cigarettes    Quit date: 11/18/2008    Years since quitting: 10.8  . Smokeless tobacco: Never Used  Substance and Sexual Activity  . Alcohol use: No    Alcohol/week: 0.0 standard drinks    Comment: quit in ~ 2010  . Drug use: No    Comment: "I smoked pot in the 1980s"  . Sexual activity: Never  Lifestyle  . Physical activity    Days per week: Not on file    Minutes per session: Not on file  . Stress: Not on file  Relationships  . Social Herbalist on phone: Not on file    Gets together: Not on file    Attends religious service: Not on file    Active member of club or organization: Not on file    Attends meetings of clubs or organizations: Not on file    Relationship status: Not on file  Other Topics Concern  . Not on file  Social History Narrative      Lives at home with  his wife   Family History  Problem Relation Age of Onset  . Emphysema Mother   . Heart disease Mother   . ALS Father   . Diabetes Sister  Scheduled Meds: . albuterol  2.5 mg Nebulization Q6H  . atorvastatin  40 mg Oral q1800  . citalopram  20 mg Oral Daily  . furosemide  40 mg Oral BID  . insulin aspart  0-5 Units Subcutaneous QHS  . insulin aspart  0-9 Units Subcutaneous TID WC  . insulin glargine  35 Units Subcutaneous BID  . lisinopril  2.5 mg Oral Daily  . [START ON 09/06/2019] methylPREDNISolone (SOLU-MEDROL) injection  60 mg Intravenous Q24H  . pantoprazole  40 mg Oral BID  . propranolol  10 mg Oral Daily  . senna  1 tablet Oral BID   Continuous Infusions: PRN Meds:.acetaminophen **OR** acetaminophen, albuterol, benzonatate, fluticasone, Melatonin, polyethylene glycol Medications Prior to Admission:  Prior to Admission medications   Medication Sig Start Date End Date Taking? Authorizing Provider  acetaminophen (TYLENOL) 325 MG tablet Take 650 mg by mouth every 6 (six) hours as needed.   Yes [provider]  albuterol (VENTOLIN HFA) 108 (90 Base) MCG/ACT inhaler Inhale 1-2 puffs into the lungs every 6 (six) hours as needed for wheezing or shortness of breath.   Yes [provider]  ALPRAZolam (XANAX) 0.25 MG tablet Take 1 tablet (0.25 mg total) by mouth 3 (three) times daily as needed for anxiety or sleep. 08/19/19  Yes Spongberg, Audie Pinto, MD  atorvastatin (LIPITOR) 40 MG tablet Take 1 tablet (40 mg total) by mouth daily at 6 PM. 08/07/19  Yes Mayo, Pete Pelt, MD  benzonatate (TESSALON) 100 MG capsule Take 100 mg by mouth every 8 (eight) hours as needed for cough or congestion. 05/20/19  Yes [provider]  citalopram (CELEXA) 40 MG tablet Take 0.5 tablets (20 mg total) by mouth daily. 05/12/17  Yes Mariel Aloe, MD  [facility] MELATONIN 5 MG TABS Take 5 mg by mouth daily as needed. For sleep 03/14/19  Yes [provider]  fluticasone  (FLONASE) 50 MCG/ACT nasal spray Place 2 sprays into both nostrils daily. Patient taking differently: Place 2 sprays into both nostrils daily as needed for allergies or rhinitis.  03/06/19  Yes Eugenie Filler, MD  furosemide (LASIX) 40 MG tablet Take 1 tablet (40 mg total) by mouth 2 (two) times daily. 08/19/19 09/18/19 Yes Spongberg, Audie Pinto, MD  gabapentin (NEURONTIN) 800 MG tablet Take 400 mg by mouth 4 (four) times daily.   Yes [provider]  insulin aspart (NOVOLOG) 100 UNIT/ML injection Inject 25 Units into the skin 3 (three) times daily with meals. 03/05/19  Yes Eugenie Filler, MD  insulin glargine (LANTUS) 100 UNIT/ML injection Inject 0.35 mLs (35 Units total) into the skin 2 (two) times daily. 05/06/19  Yes Shelly Coss, MD  lisinopril (ZESTRIL) 2.5 MG tablet Take 1 tablet (2.5 mg total) by mouth daily. 04/28/19  Yes Domenic Polite, MD  pantoprazole (PROTONIX) 40 MG tablet Take 1 tablet (40 mg total) by mouth 2 (two) times daily. 03/05/19  Yes Eugenie Filler, MD  polyethylene glycol (MIRALAX / GLYCOLAX) 17 g packet Take 17 g by mouth daily as needed for mild constipation. 08/07/19  Yes Mayo, Pete Pelt, MD  tiotropium (SPIRIVA) 18 MCG inhalation capsule Place 18 mcg into inhaler and inhale daily.   Yes [provider]  HYDROcodone-acetaminophen (NORCO/VICODIN) 5-325 MG tablet Take 1 tablet by mouth every 4 (four) hours as needed for moderate pain. Patient not taking: Reported on 09/05/2019 08/19/19   Marcell Anger, MD  predniSONE (STERAPRED UNI-PAK 21 TAB) 10 MG (21) TBPK tablet Take per  package insert Patient not taking: Reported on 09/05/2019 08/19/19   Marcell Anger, MD  propranolol (INDERAL) 10 MG tablet Take 10 mg by mouth daily. 08/16/19   [provider]   Allergies  Allergen Reactions  . Morphine And Related Shortness Of Breath, Nausea And Vomiting, Rash and Other (See Comments)    Reaction:  Hallucinations   .  Penicillins Anaphylaxis, Hives and Other (See Comments)    10/02/18 - discussed with patient and he states he can tolerate penicillin capsules and had some hives when he was 78yo and received pcn injections  Has patient had a PCN reaction causing immediate rash, facial/tongue/throat swelling, SOB or lightheadedness with hypotension: Yes Has patient had a PCN reaction causing severe rash involving mucus membranes or skin necrosis: No Has patient had a PCN reaction that required hospitalization No Has patient had a PCN reaction occurring within the last 10 y  . Zolpidem Shortness Of Breath  . Demerol [Meperidine] Other (See Comments)    Reaction:  Hallucinations    . Dilaudid [Hydromorphone Hcl] Other (See Comments)    Reaction:  Hallucinations   . Levofloxacin Other (See Comments)    Reaction:  Unknown    Review of Systems  Unable to perform ROS   Physical Exam Vitals signs and nursing note reviewed.  Constitutional:      Comments: Obese, lethargic  Pulmonary:     Comments: On Bipap Neurological:     Mental Status: He is disoriented.     Vital Signs: BP (!) 146/83   Pulse 91   Temp 97.6 F (36.4 C) (Oral)   Resp (!) 25   Wt 126.1 kg   SpO2 95%   BMI 34.75 kg/m  Pain Scale: 0-10   Pain Score: 0-No pain   SpO2: SpO2: 95 % O2 Device:SpO2: 95 % O2 Flow Rate: .O2 Flow Rate (L/min): 5 L/min  IO: Intake/output summary: No intake or output data in the 24 hours ending 09/05/19 1406  LBM:   Baseline Weight: Weight: 121.7 kg Most recent weight: Weight: 126.1 kg     Palliative Assessment/Data: PPS: 40%     Thank you for this consult. Palliative medicine will continue to follow and assist as needed.   Time In: 1300 Time Out: 1415 Time Total: 75 minutes Greater than 50%  of this time was spent counseling and coordinating care related to the above assessment and plan.  Signed by: Mariana Kaufman, AGNP-C Palliative Medicine    Please contact Palliative Medicine Team  phone at [phone] for questions and concerns.  For individual provider: See Shea Evans

## 2019-09-05 NOTE — ED Notes (Signed)
Pt taken to ICU by this RN. All of belongings sent with patient. RT with this RN to transfer on BIPAP. Pt clean and dry.
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
## 2019-09-06 ENCOUNTER — Observation Stay: Payer: Medicare HMO

## 2019-09-06 DIAGNOSIS — I5032 Chronic diastolic (congestive) heart failure: Secondary | ICD-10-CM

## 2019-09-06 DIAGNOSIS — G4733 Obstructive sleep apnea (adult) (pediatric): Secondary | ICD-10-CM | POA: Diagnosis not present

## 2019-09-06 DIAGNOSIS — J441 Chronic obstructive pulmonary disease with (acute) exacerbation: Secondary | ICD-10-CM | POA: Diagnosis not present

## 2019-09-06 DIAGNOSIS — Z9989 Dependence on other enabling machines and devices: Secondary | ICD-10-CM

## 2019-09-06 DIAGNOSIS — J9602 Acute respiratory failure with hypercapnia: Secondary | ICD-10-CM | POA: Diagnosis not present

## 2019-09-06 LAB — BASIC METABOLIC PANEL
Anion gap: 11 (ref 5–15)
BUN: 28 mg/dL — ABNORMAL HIGH (ref 8–23)
CO2: 32 mmol/L (ref 22–32)
Calcium: 8.7 mg/dL — ABNORMAL LOW (ref 8.9–10.3)
Chloride: 88 mmol/L — ABNORMAL LOW (ref 98–111)
Creatinine, Ser: 1.24 mg/dL (ref 0.61–1.24)
GFR calc Af Amer: >60 mL/min (ref >60)
GFR calc non Af Amer: 56 mL/min — ABNORMAL LOW (ref >60)
Glucose, Bld: 353 mg/dL — ABNORMAL HIGH (ref 70–99)
Potassium: 4.9 mmol/L (ref 3.5–5.1)
Sodium: 131 mmol/L — ABNORMAL LOW (ref 135–145)

## 2019-09-06 LAB — GLUCOSE, CAPILLARY
Glucose-Capillary: 291 mg/dL — ABNORMAL HIGH (ref 70–99)
Glucose-Capillary: 358 mg/dL — ABNORMAL HIGH (ref 70–99)

## 2019-09-06 MED ORDER — PREDNISONE 10 MG PO TABS: 50.0000 mg | ORAL_TABLET | Freq: Every day | ORAL | Status: DC

## 2019-09-06 MED ORDER — TORSEMIDE 20 MG PO TABS: 40.0000 mg | ORAL_TABLET | Freq: Two times a day (BID) | ORAL | 0 refills | Status: DC

## 2019-09-06 MED ORDER — PREDNISONE 50 MG PO TABS: 50.0000 mg | ORAL_TABLET | Freq: Every day | ORAL | 0 refills | Status: DC

## 2019-09-06 MED ORDER — PREDNISONE 10 MG PO TABS: 50.0000 mg | ORAL_TABLET | Freq: Every day | ORAL | 0 refills | Status: DC

## 2019-09-06 MED ORDER — TORSEMIDE 20 MG PO TABS
40.0000 mg | ORAL_TABLET | Freq: Two times a day (BID) | ORAL | Status: DC
  Administered 2019-09-06: 40 mg via ORAL
  Filled 2019-09-06: qty 2
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
## 2019-09-06 NOTE — Discharge Instructions (Signed)
Use your CPAP every night and during daytime when taking naps without fail

## 2019-09-06 NOTE — Care Management Obs Status (Signed)
[place] NOTIFICATION   Patient Details  Name: ZIMRI KLOEPFER MRN: [id number] Date of Birth: [birth year]   Medicare Observation Status Notification Given:  Yes    Annamaria Boots, Latanya Presser 09/06/2019, 10:57 AM

## 2019-09-06 NOTE — Progress Notes (Signed)
Pt placed back on the Bipap.

## 2019-09-06 NOTE — Progress Notes (Signed)
Bipap removed from pt per his demand.  States this nurse is to come back into the room in an hour and a half and wake him up and put it back on then.  Expressed to pt my concern he has not had the Bipap on very long and he needs to wear it more to decrease his CO2.  Pt was not swayed.

## 2019-09-06 NOTE — TOC Transition Note (Signed)
Transition of Care [facility]) - CM/SW Discharge Note   Patient Details  Name: [name] MRN: [id number] Date of Birth: Jan 08, 1941  [facility] CM/SW Contact:  Annamaria Boots, [place] Phone Number: 09/06/2019, 9:58 AM   Clinical Narrative:   Patient is medically ready for discharge home today. CSW notified patient's wife of discharge today. Wife states that she will pick up patient around 1pm today. Patient already has all needed equipment including Afflovest and NIV. CSW notified [place] with [facility] that patient will discharge home today.     Final next level of care: [place] Barriers to Discharge: Barriers Resolved   Patient Goals and CMS Choice Patient states their goals for this hospitalization and ongoing recovery are:: Return home CMS Medicare.gov Compare Post Acute Care list provided to:: Patient Choice offered to / list presented to : Patient  Discharge Placement                       Discharge Plan and Services     Post Acute Care Choice: Home Health                    HH Arranged: RN, PT, OT [facility] Agency: [facility] [place] Date [place]: 09/06/19 Time [place]: [phone] Representative spoke with at [place]: [place] ([place]) Interventions     Readmission Risk Interventions Readmission Risk Prevention Plan 08/15/2019 07/08/2019 04/13/2019  Transportation Screening Complete Complete Complete  Medication Review Press photographer) Referral to Pharmacy Complete Complete  PCP or Specialist appointment within 3-5 days of discharge Complete Complete Complete  HRI or Home Care Consult Complete Complete Complete  SW Recovery Care/Counseling Consult - Complete Complete  Palliative Care Screening Complete Patient Refused Patient [place] Complete Not Applicable Patient Refused  Some recent data might be hidden

## 2019-09-06 NOTE — Discharge Summary (Addendum)
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 AAA (abdominal aortic aneurysm) ([place])    a. 12/2008 s/p 7cm, endovascular repair with coiling right hypogastric artery   . Acute [place] ulcer   . Adenomatous duodenal polyp   . Allergic rhinitis, cause unspecified   . Anxiety   . AVM (arteriovenous malformation) of colon with hemorrhage   . Bipolar 1 disorder, mixed, moderate ([place]) 04/16/2015  . CAD (coronary artery disease)    a. 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  . Chronic diastolic CHF (congestive heart failure) ([place])    a. 04/2015 Echo: EF 55-60%, no rwma, Gr 1 DD, mild AI.  Marland Kitchen Complication of anesthesia    "if they sedate me for too long, they have to intubate me; then they can't get me to come out of it" (04/03/2017)  . COPD (chronic obstructive pulmonary disease) ([place])    a. GOLD stage IV, started home O2. Severe bullous disease of LUL. Prolonged intubation after surgeries due to COPD.  Marland Kitchen Depression with anxiety 01/14/2013  . Diabetes mellitus with complication ([place])   . Diverticulosis   . Duodenal diverticulum   . Duodenal ulcer   . Emphysema of lung ([place])   . Esophagitis   . Essential hypertension 08/18/2009   Qualifier:  Diagnosis of  By: Doy Mince LPN, Megan    . GERD (gastroesophageal reflux disease)   . GI bleed requiring more than 4 units of blood in 24 hours, ICU, or surgery    a. Hx bleeding gastric polyps, cecal & sigmoid AVMS s/p APC 03/30/14  . Hiatal hernia    large  . History of blood transfusion    "many many many; related to blood loss; anemia"  . Hyperlipidemia   . Insomnia 08/10/2014  . Leucocytosis 12/04/2013  . Major depressive disorder   . Memory loss   . Morbid obesity ([place])   . Multiple gastric polyps   . Myocardial infarction ([place])    "I think I had a minor one when I had the OHS"  . On home oxygen therapy    "7 liters [place] w/oxigenator" (04/03/2017)  . Pneumonia 2017  . Recurrent Microcytic Anemia    a. presumed chronic GI blood loss.  . Type II diabetes mellitus ([place])   . Vitamin D deficiency 08/10/2014    HOSPITAL COURSE:   [name]  is a 78 y.o. male with a known history of morbid obesity, chronic COPD on home oxygen 5 L nasal cannula, chronic diastolic heart failure, bipolar disorder, type II diabetes, chronic nomothetic anemia comes to the emergency room from home with increasing shortness of breath, altered mental status and lethargy  1. Acute on chronic hypoxic hypercarbic respiratory failure with history of end-stage COPD/morbid obesity/obesity hypoventilation syndrome -has had recurrent admissions in the past for similar issues. -currently  off BiPAP-- on 4 L nasal cannula oxygen chronically -placing patient under observation and step down ICU--- he may need inpatient criteria pending course -IV Solu-Medrol 60 mg BID-- inch to oral steroid taper -no evidence of pneumonia hold off antibiotics -nebulizer, inhalers -pulmonary consultation with Dr. Lanney Gins appreciated recommends continue in inhalers, nebulizer, changed to torsemide, and advise use of CPAP at home -patient feels a lot better this morning. Back to his baseline. Mentation back to baseline.  2. Acute  on chronic mild diastolic heart failure -received IV Lasix 40 BID---will prescribed torsemide 40 mg BID. Patient informed -monitor input output, daily weight -continue rest of cardiac meds  3. Anemia of chronic disease -stable hemoglobin -stool cultures negative during last admission which was few days ago -received IV iron infusions in October, 2020  4. Diabetes-II with peripheral neuropathy -sliding scale insulin and home dose insulin -patient advised to continue his insulin dosing at home, dietary counseling done.  5. History of bipolar disorder -continue home meds -PRN Xanax  6. Ongoing tobacco abuse according to wife  7. Obesity with obstructive sleep apnea -according to the wife patient has been noncompliant with his CPAP at home -again address with patient to use CPAP at home.  8. Febrile illness-- resolved -COVID-19 was not done. -white count normal -chest x-ray no new findings other than atelectasis. Will continue to monitor fever curve. Holding off antibiotics at present. -Repeat chest x-ray shows improvement. Patient overall is back to baseline.  [name] has multiple comorbidities with multiple chronic medical conditions and is at a very high risk for readmission given his chronic medical issues and noncompliance with use of CPAP at home. Patient also continues to smoke, according to the wife. Above was discussed with patient's wife.  Palliative care consultation appreciated.  Will set up home health RN and physical therapy. Will set up appointment with pulmonology for close follow-up patient advised to follow-up with cardiology and his primary care physician.   Family Communication: discussed with wife on the phone Consults: pulmonary, palliative care Code Status: full code DVT prophylaxis: Lovenox CONSULTS OBTAINED:  Treatment Team:  Ottie Glazier, MD  DRUG ALLERGIES:   Allergies  Allergen Reactions  . Morphine And Related Shortness Of  Breath, Nausea And Vomiting, Rash and Other (See Comments)    Reaction:  Hallucinations   . Penicillins Anaphylaxis, Hives and Other (See Comments)    10/02/18 - discussed with patient and he states he can tolerate penicillin capsules and had some hives when he was 78yo and received pcn injections  Has patient had a PCN reaction causing immediate rash, facial/tongue/throat swelling, SOB or lightheadedness with hypotension: Yes Has patient had a PCN reaction causing severe rash involving mucus membranes or skin necrosis: No Has patient had a PCN reaction that required hospitalization No Has patient had a PCN reaction occurring within the last 10 y  . Zolpidem Shortness Of Breath  . Demerol [Meperidine] Other (See Comments)    Reaction:  Hallucinations    . Dilaudid [Hydromorphone Hcl] Other (See Comments)    Reaction:  Hallucinations   . Levofloxacin Other (See Comments)    Reaction:  Unknown     DISCHARGE MEDICATIONS:   Allergies as of 09/06/2019      Reactions   Morphine And Related Shortness Of Breath, Nausea And Vomiting, Rash, Other (See Comments)   Reaction:  Hallucinations    Penicillins Anaphylaxis, Hives, Other (See Comments)   10/02/18 - discussed with patient and he states he can tolerate penicillin  capsules and had some hives when he was 78yo and received pcn injections Has patient had a PCN reaction causing immediate rash, facial/tongue/throat swelling, SOB or lightheadedness with hypotension: Yes Has patient had a PCN reaction causing severe rash involving mucus membranes or skin necrosis: No Has patient had a PCN reaction that required hospitalization No Has patient had a PCN reaction occurring within the last 10 y   Zolpidem Shortness Of Breath   Demerol [meperidine] Other (See Comments)   Reaction:  Hallucinations     Dilaudid [hydromorphone Hcl] Other (See Comments)   Reaction:  Hallucinations    Levofloxacin Other (See Comments)   Reaction:  Unknown        Medication List    STOP taking these medications   furosemide 40 MG tablet Commonly known as: LASIX   HYDROcodone-acetaminophen 5-325 MG tablet Commonly known as: NORCO/VICODIN   predniSONE 10 MG (21) Tbpk tablet Commonly known as: STERAPRED UNI-PAK 21 TAB Replaced by: predniSONE 50 MG tablet     TAKE these medications   acetaminophen 325 MG tablet Commonly known as: TYLENOL Take 650 mg by mouth every 6 (six) hours as needed.   albuterol 108 (90 Base) MCG/ACT inhaler Commonly known as: VENTOLIN HFA Inhale 1-2 puffs into the lungs every 6 (six) hours as needed for wheezing or shortness of breath.   ALPRAZolam 0.25 MG tablet Commonly known as: XANAX Take 1 tablet (0.25 mg total) by mouth 3 (three) times daily as needed for anxiety or sleep.   atorvastatin 40 MG tablet Commonly known as: LIPITOR Take 1 tablet (40 mg total) by mouth daily at 6 PM.   benzonatate 100 MG capsule Commonly known as: TESSALON Take 100 mg by mouth every 8 (eight) hours as needed for cough or congestion.   citalopram 40 MG tablet Commonly known as: CELEXA Take 0.5 tablets (20 mg total) by mouth daily.   [facility] Melatonin 5 MG Tabs Generic drug: Melatonin Take 5 mg by mouth daily as needed. For sleep   fluticasone 50 MCG/ACT nasal spray Commonly known as: FLONASE Place 2 sprays into both nostrils daily. What changed:   when to take this  reasons to take this   gabapentin 800 MG tablet Commonly known as: NEURONTIN Take 400 mg by mouth 4 (four) times daily.   insulin aspart 100 UNIT/ML injection Commonly known as: novoLOG Inject 25 Units into the skin 3 (three) times daily with meals.   insulin glargine 100 UNIT/ML injection Commonly known as: LANTUS Inject 0.35 mLs (35 Units total) into the skin 2 (two) times daily.   lisinopril 2.5 MG tablet Commonly known as: ZESTRIL Take 1 tablet (2.5 mg total) by mouth daily.   pantoprazole 40 MG tablet Commonly known as: PROTONIX Take 1  tablet (40 mg total) by mouth 2 (two) times daily.   polyethylene glycol 17 g packet Commonly known as: MIRALAX / GLYCOLAX Take 17 g by mouth daily as needed for mild constipation.   predniSONE 50 MG tablet Commonly known as: DELTASONE Take 1 tablet (50 mg total) by mouth daily with breakfast. Start taking on: September 07, 2019 Replaces: predniSONE 10 MG (21) Tbpk tablet   propranolol 10 MG tablet Commonly known as: INDERAL Take 10 mg by mouth daily.   tiotropium 18 MCG inhalation capsule Commonly known as: SPIRIVA Place 18 mcg into inhaler and inhale daily.   torsemide 20 MG tablet Commonly known as: DEMADEX Take 2 tablets (40 mg total) by mouth 2 (two) times daily.  If you experience worsening of your admission symptoms, develop shortness of breath, life threatening emergency, suicidal or homicidal thoughts you must seek medical attention immediately by calling 911 or calling your MD immediately  if symptoms less severe.  You Must read complete instructions/literature along with all the possible adverse reactions/side effects for all the Medicines you take and that have been prescribed to you. Take any new Medicines after you have completely understood and accept all the possible adverse reactions/side effects.   Please note  You were cared for by a hospitalist during your hospital stay. If you have any questions about your discharge medications or the care you received while you were in the hospital after you are discharged, you can call the unit and asked to speak with the hospitalist on call if the hospitalist that took care of you is not available. Once you are discharged, your primary care physician will handle any further medical issues. Please note that NO REFILLS for any discharge medications will be authorized once you are discharged, as it is imperative that you return to your primary care physician (or establish a relationship with a primary care physician if you do  not have one) for your aftercare needs so that they can reassess your need for medications and monitor your lab values. Today   SUBJECTIVE  reports feeling a whole lot better. His out in the chair ate breakfast and off BiPAP   VITAL SIGNS:  Blood pressure (!) 115/56, pulse 91, temperature 97.6 F (36.4 C), temperature source Axillary, resp. rate 12, height 6\' 3"  (1.905 m), weight 126.1 kg, SpO2 99 %.  I/O:    Intake/Output Summary (Last 24 hours) at 09/06/2019 0930 Last data filed at 09/06/2019 [id number] Gross per 24 hour  Intake -  Output 800 ml  Net -800 ml    PHYSICAL EXAMINATION:  GENERAL:  78 y.o.-year-old patient lying in the bed with no acute distress. Morbid obesity, he is chronically ill EYES: Pupils equal, round, reactive to light and accommodation. No scleral icterus. Extraocular muscles intact.  HEENT: Head atraumatic, normocephalic. Oropharynx and nasopharynx clear.  NECK:  Supple, no jugular venous distention. No thyroid enlargement, no tenderness.  LUNGS: Normal breath sounds bilaterally, no wheezing, rales,rhonchi or crepitation. No use of accessory muscles of respiration.  CARDIOVASCULAR: S1, S2 normal. No murmurs, rubs, or gallops.  ABDOMEN: Soft, non-tender, non-distended. Bowel sounds present. No organomegaly or mass. Abdominal obesity EXTREMITIES: + pedal edema,  No cyanosis, or clubbing.  NEUROLOGIC: Cranial nerves II through XII are intact. Muscle strength 5/5 in all extremities. Sensation intact. Gait not checked.  PSYCHIATRIC: The patient is alert and oriented x 3.  SKIN: No obvious rash, lesion, or ulcer.   DATA REVIEW:   CBC  Recent Labs  Lab 09/05/19 0938  WBC 9.8  HGB 9.4*  HCT 30.9*  PLT 245    Chemistries  Recent Labs  Lab 09/06/19 0418  NA 131*  K 4.9  CL 88*  CO2 32  GLUCOSE 353*  BUN 28*  CREATININE 1.24  CALCIUM 8.7*    Microbiology Results   Recent Results (from the past 240 hour(s))  MRSA PCR Screening     Status: None    Collection Time: 09/05/19  3:15 PM   Specimen: Nasopharyngeal  Result Value Ref Range Status   MRSA by PCR NEGATIVE NEGATIVE Final    Comment:        The GeneXpert MRSA Assay (FDA approved for NASAL specimens only), is one component of a  comprehensive MRSA colonization surveillance program. It is not intended to diagnose MRSA infection nor to guide or monitor treatment for MRSA infections. Performed at [facility], [address] [place], [place] [address]     RADIOLOGY:  Dg Chest Port 1 View  Result Date: 09/06/2019 CLINICAL DATA:  Pulmonary disease EXAM: PORTABLE CHEST 1 VIEW COMPARISON:  Yesterday FINDINGS: Chronic cardiomegaly. Hiatal hernia. Stable mild interstitial coarsening without Kerley lines. No air bronchogram. There is a bleb at the left apex. Prior CABG. IMPRESSION: Stable interstitial coarsening. Cardiomegaly and hiatal hernia. Electronically Signed   By: Monte Fantasia M.D.   On: 09/06/2019 06:21   Dg Chest Portable 1 View  Result Date: 09/05/2019 CLINICAL DATA:  Dyspnea and shortness of breath for EXAM: PORTABLE CHEST 1 VIEW COMPARISON:  August 13, 2019 FINDINGS: Again noted is mild cardiomegaly. There is prominence to the central pulmonary vasculature. Hazy airspace opacity seen at the left lower lung not significantly changed since the prior exam. There is also blunting of the left costophrenic angle, likely small effusion. Overlying median sternotomy wires. IMPRESSION: Pulmonary vascular congestion and hazy airspace opacity at the left lung base which could be layering effusion and/or early infectious etiology. This is not significantly changed since the prior exam. Trace left pleural effusion. Electronically Signed   By: Prudencio Pair M.D.   On: 09/05/2019 10:16     CODE STATUS:     Code Status Orders  (From admission, onward)         Start     Ordered   09/05/19 1327  Full code  Continuous     09/05/19 1326        Code Status History     Date Active Date Inactive Code Status Order ID Comments User Context   08/13/2019 2048 08/19/2019 1721 Full Code [id number]  Demetrios Loll, MD Inpatient   08/04/2019 1458 08/08/2019 0110 Full Code [id number]  Bettey Costa, MD ED   07/06/2019 0135 07/08/2019 1919 Full Code [id number]  Jani Gravel, MD ED   06/21/2019 0936 06/25/2019 1730 Full Code [id number]  Mercy Riding, MD ED   05/05/2019 1137 05/06/2019 2332 Full Code [id number]  Caren Griffins, MD Inpatient   04/27/2019 2033 04/28/2019 1819 Full Code [id number]  Lenore Cordia, MD ED   04/07/2019 1229 04/13/2019 1645 DNR [id number]  Wilhelmina Mcardle, MD Inpatient   04/06/2019 1048 04/07/2019 1229 Full Code [id number]  Lang Snow, NP ED   02/28/2019 0924 03/05/2019 2017 Full Code [id number]  Deatra James, MD ED   12/26/2018 1059 12/31/2018 2100 Full Code [id number]  Nita Sells, MD Inpatient   10/11/2018 2353 10/14/2018 1912 Full Code [id number]  Etta Quill, DO ED   10/02/2018 0309 10/06/2018 1741 Full Code [id number]  Vianne Bulls, MD ED   02/08/2018 1042 02/11/2018 1624 Full Code [id number]  Velvet Bathe, MD Inpatient   01/28/2018 0641 01/30/2018 2132 Full Code [id number]  Arta Silence, MD Inpatient   01/18/2018 2225 01/20/2018 2210 Full Code [id number]  Lance Coon, MD Inpatient   06/28/2017 1203 06/30/2017 1554 Full Code [id number]  Rondel Jumbo, PA-C ED   05/08/2017 1341 05/11/2017 2127 Full Code [id number]  Waldemar Dickens, MD ED   04/03/2017 1715 04/10/2017 1745 Full Code [id number]  Waldemar Dickens, MD ED   03/30/2017 1927 03/31/2017 1928 Full Code [id number]  Bonnielee Haff, MD Inpatient   02/22/2017 1614 02/24/2017 [place] Full Code [id number]  Fritzi Mandes, MD Inpatient   11/07/2016 1020 11/09/2016  [place] Full Code [id number]  Saundra Shelling, MD Inpatient   10/21/2016 0414 10/25/2016 1503 Full Code [id number]  Harvie Bridge, DO Inpatient   10/09/2016 1152 10/12/2016 1649 Full Code [id number]  Lytle Butte, MD ED   10/09/2016 1105 10/09/2016  1152 DNR [id number]  Lavetta Nielsen, Aaron Mose, MD ED   07/24/2016 0806 07/27/2016 1759 DNR [id number]  Harrie Foreman, MD Inpatient   02/10/2016 1528 02/12/2016 2009 DNR [id number]  Loletha Grayer, MD ED   10/26/2015 0314 11/01/2015 2005 Full Code [id number]  Saundra Shelling, MD Inpatient   08/20/2015 0441 08/22/2015 1649 Full Code [id number]  Hower, Aaron Mose, MD ED   08/20/2015 0254 08/20/2015 0441 Full Code [id number]  Hower, Aaron Mose, MD ED   07/27/2015 2040 07/29/2015 1829 Full Code [id number]  Hower, Aaron Mose, MD ED   06/30/2015 1240 07/03/2015 1526 Full Code [id number]  Bettey Costa, MD Inpatient   05/19/2015 1459 05/23/2015 1621 Full Code [id number]  Max Sane, MD Inpatient   05/06/2015 1714 05/13/2015 1706 Full Code [id number]  Epifanio Lesches, MD ED   05/03/2015 1229 05/04/2015 1920 Full Code [id number]  Idelle Crouch, MD Inpatient   04/22/2015 0450 04/27/2015 1935 Full Code [id number]  Harrie Foreman, MD Inpatient   04/17/2015 1916 04/19/2015 1716 Full Code [id number]  Clovis Fredrickson, MD Inpatient   04/10/2015 0912 04/17/2015 1908 Full Code [id number]  Demetrios Loll, MD Inpatient   03/09/2015 1218 03/12/2015 1845 Full Code [id number]  Henreitta Leber, MD Inpatient   08/24/2014 2141 08/25/2014 1720 Full Code [id number]  Ivor Costa, MD ED   04/02/2014 2011 04/04/2014 1710 Full Code [id number]  Velvet Bathe, MD Inpatient   03/18/2014 1544 03/22/2014 1406 Full Code [id number]  Verlee Monte, MD Inpatient   12/04/2013 0613 12/10/2013 1954 Full Code [id number]  Rise Patience, MD Inpatient   10/15/2013 2011 10/18/2013 1840 Full Code [id number]  Fuller Plan, MD Inpatient   09/08/2013 0043 09/10/2013 1530 Full Code [id number]  Toy Baker, MD Inpatient   08/19/2013 1748 08/22/2013 1551 Full Code [id number]  Orson Eva, MD ED   05/15/2013 0126 05/17/2013 1659 Full Code [id number]  Theodis Blaze, MD Inpatient   12/04/2012 1723 12/08/2012 1656 Full Code [id number]  Janece Canterbury, MD Inpatient   05/17/2012 1256 05/21/2012 1809 Full Code  [id number]  Campbell Lerner, RN ED   04/06/2012 1935 04/14/2012 1723 Full Code [id number]  Marylou Mccoy, RN Inpatient   03/27/2012 0444 03/28/2012 1756 Full Code [id number]  Theotis Barrio, RN Inpatient   Advance Care Planning Activity     Family Discussion:Virginia Colee on the phone Consults: Pulmonary Dr Lanney Gins   TOTAL TIME TAKING CARE OF THIS PATIENT: *40* minutes.    Fritzi Mandes M.D on 09/06/2019 at 9:30 AM  Between 7am to 6pm - Pager - [phone] After 6pm go to [email or web address] - password [email or web address]  [facility]    CC: Primary care physician; [name], MD

## 2019-09-09 LAB — BLOOD GAS, VENOUS
Acid-Base Excess: 11.3 mmol/L — ABNORMAL HIGH (ref 0.0–2.0)
Bicarbonate: 41.8 mmol/L — ABNORMAL HIGH (ref 20.0–28.0)
O2 Saturation: 91.4 %
Patient temperature: 37
pCO2, Ven: 87 mmHg (ref 44.0–60.0)
pH, Ven: 7.29 (ref 7.250–7.430)
pO2, Ven: 69 mmHg — ABNORMAL HIGH (ref 32.0–45.0)

## 2019-09-13 ENCOUNTER — Other Ambulatory Visit: Payer: Self-pay

## 2019-09-13 ENCOUNTER — Emergency Department: Payer: Medicare HMO

## 2019-09-13 ENCOUNTER — Encounter: Payer: Self-pay | Admitting: Emergency Medicine

## 2019-09-13 ENCOUNTER — Emergency Department
Admission: EM | Admit: 2019-09-13 | Discharge: 2019-09-13 | Disposition: A | Discharge status: Home / Self Care | Payer: Medicare HMO | Attending: Emergency Medicine | Admitting: Emergency Medicine

## 2019-09-13 ENCOUNTER — Emergency Department (HOSPITAL_COMMUNITY)
Admission: EM | Admit: 2019-09-13 | Discharge: 2019-09-16 | Disposition: A | Discharge status: Home / Self Care | Payer: Medicare HMO | Attending: Internal Medicine | Admitting: Internal Medicine

## 2019-09-13 DIAGNOSIS — Z79899 Other long term (current) drug therapy: Secondary | ICD-10-CM | POA: Diagnosis not present

## 2019-09-13 DIAGNOSIS — I130 Hypertensive heart and chronic kidney disease with heart failure and stage 1 through stage 4 chronic kidney disease, or unspecified chronic kidney disease: Secondary | ICD-10-CM | POA: Insufficient documentation

## 2019-09-13 DIAGNOSIS — R0602 Shortness of breath: Secondary | ICD-10-CM | POA: Diagnosis present

## 2019-09-13 DIAGNOSIS — R079 Chest pain, unspecified: Secondary | ICD-10-CM | POA: Insufficient documentation

## 2019-09-13 DIAGNOSIS — J40 Bronchitis, not specified as acute or chronic: Secondary | ICD-10-CM | POA: Insufficient documentation

## 2019-09-13 DIAGNOSIS — Z794 Long term (current) use of insulin: Secondary | ICD-10-CM | POA: Diagnosis not present

## 2019-09-13 DIAGNOSIS — N189 Chronic kidney disease, unspecified: Secondary | ICD-10-CM | POA: Diagnosis not present

## 2019-09-13 DIAGNOSIS — Z87891 Personal history of nicotine dependence: Secondary | ICD-10-CM | POA: Insufficient documentation

## 2019-09-13 DIAGNOSIS — I2510 Atherosclerotic heart disease of native coronary artery without angina pectoris: Secondary | ICD-10-CM | POA: Insufficient documentation

## 2019-09-13 DIAGNOSIS — E119 Type 2 diabetes mellitus without complications: Secondary | ICD-10-CM | POA: Insufficient documentation

## 2019-09-13 DIAGNOSIS — I5032 Chronic diastolic (congestive) heart failure: Secondary | ICD-10-CM | POA: Diagnosis not present

## 2019-09-13 DIAGNOSIS — F322 Major depressive disorder, single episode, severe without psychotic features: Secondary | ICD-10-CM | POA: Diagnosis not present

## 2019-09-13 DIAGNOSIS — T1491XA Suicide attempt, initial encounter: Secondary | ICD-10-CM

## 2019-09-13 DIAGNOSIS — Z20828 Contact with and (suspected) exposure to other viral communicable diseases: Secondary | ICD-10-CM | POA: Diagnosis not present

## 2019-09-13 DIAGNOSIS — T50902A Poisoning by unspecified drugs, medicaments and biological substances, intentional self-harm, initial encounter: Secondary | ICD-10-CM | POA: Diagnosis present

## 2019-09-13 DIAGNOSIS — F332 Major depressive disorder, recurrent severe without psychotic features: Secondary | ICD-10-CM | POA: Diagnosis not present

## 2019-09-13 DIAGNOSIS — I110 Hypertensive heart disease with heart failure: Secondary | ICD-10-CM | POA: Diagnosis not present

## 2019-09-13 DIAGNOSIS — J449 Chronic obstructive pulmonary disease, unspecified: Secondary | ICD-10-CM | POA: Insufficient documentation

## 2019-09-13 DIAGNOSIS — E1122 Type 2 diabetes mellitus with diabetic chronic kidney disease: Secondary | ICD-10-CM | POA: Insufficient documentation

## 2019-09-13 DIAGNOSIS — Z951 Presence of aortocoronary bypass graft: Secondary | ICD-10-CM | POA: Diagnosis not present

## 2019-09-13 LAB — CBC WITH DIFFERENTIAL/PLATELET
Abs Immature Granulocytes: 0.65 10*3/uL — ABNORMAL HIGH (ref 0.00–0.07)
Basophils Absolute: 0 10*3/uL (ref 0.0–0.1)
Basophils Relative: 0 %
Eosinophils Absolute: 0.2 10*3/uL (ref 0.0–0.5)
Eosinophils Relative: 2 %
HCT: 30.7 % — ABNORMAL LOW (ref 39.0–52.0)
Hemoglobin: 9.1 g/dL — ABNORMAL LOW (ref 13.0–17.0)
Immature Granulocytes: 6 %
Lymphocytes Relative: 7 %
Lymphs Abs: 0.7 10*3/uL (ref 0.7–4.0)
MCH: 26.6 pg (ref 26.0–34.0)
MCHC: 29.6 g/dL — ABNORMAL LOW (ref 30.0–36.0)
MCV: 89.8 fL (ref 80.0–100.0)
Monocytes Absolute: 1.1 10*3/uL — ABNORMAL HIGH (ref 0.1–1.0)
Monocytes Relative: 11 %
Neutro Abs: 7.7 10*3/uL (ref 1.7–7.7)
Neutrophils Relative %: 74 %
Platelets: 197 10*3/uL (ref 150–400)
RBC: 3.42 MIL/uL — ABNORMAL LOW (ref 4.22–5.81)
RDW: 16.1 % — ABNORMAL HIGH (ref 11.5–15.5)
WBC: 10.4 10*3/uL (ref 4.0–10.5)
nRBC: 0 % (ref 0.0–0.2)

## 2019-09-13 LAB — COMPREHENSIVE METABOLIC PANEL
ALT: 16 U/L (ref 0–44)
AST: 14 U/L — ABNORMAL LOW (ref 15–41)
Albumin: 3.1 g/dL — ABNORMAL LOW (ref 3.5–5.0)
Alkaline Phosphatase: 97 U/L (ref 38–126)
Anion gap: 10 (ref 5–15)
BUN: 21 mg/dL (ref 8–23)
CO2: 37 mmol/L — ABNORMAL HIGH (ref 22–32)
Calcium: 8.1 mg/dL — ABNORMAL LOW (ref 8.9–10.3)
Chloride: 84 mmol/L — ABNORMAL LOW (ref 98–111)
Creatinine, Ser: 1.18 mg/dL (ref 0.61–1.24)
GFR calc Af Amer: >60 mL/min (ref >60)
GFR calc non Af Amer: 59 mL/min — ABNORMAL LOW (ref >60)
Glucose, Bld: 119 mg/dL — ABNORMAL HIGH (ref 70–99)
Potassium: 4 mmol/L (ref 3.5–5.1)
Sodium: 131 mmol/L — ABNORMAL LOW (ref 135–145)
Total Bilirubin: 0.5 mg/dL (ref 0.3–1.2)
Total Protein: 6.1 g/dL — ABNORMAL LOW (ref 6.5–8.1)

## 2019-09-13 LAB — BRAIN NATRIURETIC PEPTIDE: B Natriuretic Peptide: 188 pg/mL — ABNORMAL HIGH (ref 0.0–100.0)

## 2019-09-13 LAB — TROPONIN I (HIGH SENSITIVITY)
Troponin I (High Sensitivity): 15 ng/L (ref <18)
Troponin I (High Sensitivity): 16 ng/L (ref <18)

## 2019-09-13 LAB — CBG MONITORING, ED: Glucose-Capillary: 190 mg/dL — ABNORMAL HIGH (ref 70–99)

## 2019-09-13 MED ORDER — IOHEXOL 350 MG/ML SOLN
75.0000 mL | Freq: Once | INTRAVENOUS | Status: AC | PRN
  Administered 2019-09-13: 04:00:00 75 mL via INTRAVENOUS

## 2019-09-13 MED ORDER — ASPIRIN 81 MG PO CHEW
324.0000 mg | CHEWABLE_TABLET | Freq: Once | ORAL | Status: DC
  Filled 2019-09-13: qty 4

## 2019-09-13 MED ORDER — DOXYCYCLINE HYCLATE 100 MG PO CAPS: 100.0000 mg | ORAL_CAPSULE | Freq: Two times a day (BID) | ORAL | 0 refills | Status: DC

## 2019-09-13 MED ORDER — IPRATROPIUM-ALBUTEROL 0.5-2.5 (3) MG/3ML IN SOLN
3.0000 mL | Freq: Once | RESPIRATORY_TRACT | Status: AC
  Administered 2019-09-13: 04:00:00 3 mL via RESPIRATORY_TRACT
  Filled 2019-09-13: qty 3

## 2019-09-13 MED ORDER — METHYLPREDNISOLONE SODIUM SUCC 125 MG IJ SOLR
125.0000 mg | Freq: Once | INTRAMUSCULAR | Status: AC
  Administered 2019-09-13: 04:00:00 125 mg via INTRAVENOUS
  Filled 2019-09-13: qty 2

## 2019-09-13 MED ORDER — ACETAMINOPHEN 500 MG PO TABS
1000.0000 mg | ORAL_TABLET | Freq: Once | ORAL | Status: AC
  Administered 2019-09-13: 09:00:00 1000 mg via ORAL
  Filled 2019-09-13: qty 2

## 2019-09-13 MED ORDER — MORPHINE SULFATE (PF) 2 MG/ML IV SOLN
INTRAVENOUS | Status: AC
  Filled 2019-09-13: qty 1

## 2019-09-13 MED ORDER — FUROSEMIDE 10 MG/ML IJ SOLN
40.0000 mg | Freq: Once | INTRAMUSCULAR | Status: AC
  Administered 2019-09-13: 04:00:00 40 mg via INTRAVENOUS
  Filled 2019-09-13: qty 4

## 2019-09-13 MED ORDER — PREDNISONE 20 MG PO TABS: 60.0000 mg | ORAL_TABLET | Freq: Every day | ORAL | 0 refills | Status: AC

## 2019-09-13 NOTE — ED Notes (Signed)
Pt ambulated himself to the bathroom unassisted with no apparent problem. Respirations even and unlabored at this time. This RN assisted pt back to bed. Pt stated "I cant breath". Oxygen saturations at this time 100% on 4L. Respirations remain even and unlabored. MD Alfred Levins informed.

## 2019-09-13 NOTE — ED Notes (Signed)
While at bedside, pt began getting verbally aggressive with staff. When staff addressed pt and asked him not to speak to pt in such ways, pt said "why nobody cares about me and all healthcare works know everything. They're all liars".

## 2019-09-13 NOTE — ED Notes (Signed)
While this NT and [name], RN at bedside with pt, asked to please not get out of bed without assistance, pt responded with, "you have 5 minutes to get here then". Pt has call bell at bedside and asked to please not get up without assistance.

## 2019-09-13 NOTE — ED Notes (Signed)
Pt placed on monitor and condom cath.

## 2019-09-13 NOTE — ED Triage Notes (Signed)
Pt presents from home via [facility] with c/o shortness of breath that began 5 hours ago. Pt also c/o sharp pressure pain that starts in back and raidates to chest. Pt had runs of SVT with rates of 130-150 on monitor according to ems.  Pt chronically on 4L oxygen. Pt has hx of copd. Pt given 324 aspirin in route.

## 2019-09-13 NOTE — ED Provider Notes (Signed)
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 at risk for infection with the SARS-CoV-2 virus that causes COVID-19. Institutional protocols and algorithms that pertain to the evaluation of patients at risk for COVID-19 are in a state of rapid change based on information released by regulatory bodies including the CDC and federal and state organizations. These policies and algorithms were followed during the patient's care in the ED.  Some ED evaluations and interventions may be delayed as a result of limited staffing during the pandemic.   ____________________________________________   FINAL CLINICAL IMPRESSION(S) / ED DIAGNOSES   Final diagnoses:  Bronchitis      NEW MEDICATIONS STARTED DURING THIS VISIT:  ED Discharge Orders  Ordered    predniSONE (DELTASONE) 20 MG tablet  Daily     09/13/19 0450    doxycycline (VIBRAMYCIN) 100 MG capsule  2 times daily     09/13/19 0450           Note:  This document was prepared using Dragon voice  recognition software and may include unintentional dictation errors.    Alfred Levins, [place], MD 09/13/19 [phone]

## 2019-09-13 NOTE — ED Notes (Signed)
Pt has condom cath on for BR needs.
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
## 2019-09-13 NOTE — ED Notes (Signed)
Pt assisted to stand to use urinal. Pt voided about 175 cc. Pt request Tylenol for pain, graham crackers, and ginger ale.

## 2019-09-13 NOTE — ED Notes (Signed)
Spoke with pt wife, she will come get patient after she gets done feeding horses. Pt informed. Pt given Ginger Ale to drink

## 2019-09-13 NOTE — ED Notes (Addendum)
Pt yelling at staff, demanding EMS pick him up immediately. Pt informed that his wife has been called multiple times without answer and that [facility] can not take him home due to pt living in [facility]. Pt cussing and trying to get out of bed. Pt is not stable on feet. Pt placed in 1H in recliner with new O2 tank.
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
## 2019-09-13 NOTE — ED Notes (Signed)
Pt transported to CT at this time.

## 2019-09-13 NOTE — ED Notes (Signed)
MD at bedside. 

## 2019-09-14 ENCOUNTER — Encounter (HOSPITAL_COMMUNITY): Payer: Self-pay | Admitting: Emergency Medicine

## 2019-09-14 ENCOUNTER — Emergency Department (HOSPITAL_COMMUNITY): Payer: Medicare HMO

## 2019-09-14 LAB — COMPREHENSIVE METABOLIC PANEL
ALT: 20 U/L (ref 0–44)
AST: 14 U/L — ABNORMAL LOW (ref 15–41)
Albumin: 3.7 g/dL (ref 3.5–5.0)
Alkaline Phosphatase: 109 U/L (ref 38–126)
Anion gap: 11 (ref 5–15)
BUN: 23 mg/dL (ref 8–23)
CO2: 35 mmol/L — ABNORMAL HIGH (ref 22–32)
Calcium: 8.7 mg/dL — ABNORMAL LOW (ref 8.9–10.3)
Chloride: 85 mmol/L — ABNORMAL LOW (ref 98–111)
Creatinine, Ser: 0.97 mg/dL (ref 0.61–1.24)
GFR calc Af Amer: >60 mL/min (ref >60)
GFR calc non Af Amer: >60 mL/min (ref >60)
Glucose, Bld: 169 mg/dL — ABNORMAL HIGH (ref 70–99)
Potassium: 4.3 mmol/L (ref 3.5–5.1)
Sodium: 131 mmol/L — ABNORMAL LOW (ref 135–145)
Total Bilirubin: 0.7 mg/dL (ref 0.3–1.2)
Total Protein: 7.2 g/dL (ref 6.5–8.1)

## 2019-09-14 LAB — CBC WITH DIFFERENTIAL/PLATELET
Abs Immature Granulocytes: 0.49 10*3/uL — ABNORMAL HIGH (ref 0.00–0.07)
Basophils Absolute: 0 10*3/uL (ref 0.0–0.1)
Basophils Relative: 0 %
Eosinophils Absolute: 0 10*3/uL (ref 0.0–0.5)
Eosinophils Relative: 0 %
HCT: 34.2 % — ABNORMAL LOW (ref 39.0–52.0)
Hemoglobin: 10.2 g/dL — ABNORMAL LOW (ref 13.0–17.0)
Immature Granulocytes: 5 %
Lymphocytes Relative: 8 %
Lymphs Abs: 0.8 10*3/uL (ref 0.7–4.0)
MCH: 26.8 pg (ref 26.0–34.0)
MCHC: 29.8 g/dL — ABNORMAL LOW (ref 30.0–36.0)
MCV: 90 fL (ref 80.0–100.0)
Monocytes Absolute: 0.7 10*3/uL (ref 0.1–1.0)
Monocytes Relative: 7 %
Neutro Abs: 7.7 10*3/uL (ref 1.7–7.7)
Neutrophils Relative %: 80 %
Platelets: 227 10*3/uL (ref 150–400)
RBC: 3.8 MIL/uL — ABNORMAL LOW (ref 4.22–5.81)
RDW: 15.9 % — ABNORMAL HIGH (ref 11.5–15.5)
WBC: 9.7 10*3/uL (ref 4.0–10.5)
nRBC: 0 % (ref 0.0–0.2)

## 2019-09-14 LAB — CBG MONITORING, ED
Glucose-Capillary: 164 mg/dL — ABNORMAL HIGH (ref 70–99)
Glucose-Capillary: 156 mg/dL — ABNORMAL HIGH (ref 70–99)
Glucose-Capillary: 138 mg/dL — ABNORMAL HIGH (ref 70–99)
Glucose-Capillary: 223 mg/dL — ABNORMAL HIGH (ref 70–99)
Glucose-Capillary: 247 mg/dL — ABNORMAL HIGH (ref 70–99)

## 2019-09-14 LAB — BLOOD GAS, ARTERIAL
Acid-Base Excess: 12.7 mmol/L — ABNORMAL HIGH (ref 0.0–2.0)
Bicarbonate: 38.9 mmol/L — ABNORMAL HIGH (ref 20.0–28.0)
Drawn by: 11249
O2 Content: 0.5 L/min
O2 Saturation: 88.2 %
Patient temperature: 97.7
pCO2 arterial: 55 mmHg — ABNORMAL HIGH (ref 32.0–48.0)
pH, Arterial: 7.461 — ABNORMAL HIGH (ref 7.350–7.450)
pO2, Arterial: 54.5 mmHg — ABNORMAL LOW (ref 83.0–108.0)

## 2019-09-14 LAB — URINALYSIS, ROUTINE W REFLEX MICROSCOPIC
Bilirubin Urine: NEGATIVE
Glucose, UA: NEGATIVE mg/dL
Hgb urine dipstick: NEGATIVE
Ketones, ur: NEGATIVE mg/dL
Leukocytes,Ua: NEGATIVE
Nitrite: NEGATIVE
Protein, ur: NEGATIVE mg/dL
Specific Gravity, Urine: 1.005 (ref 1.005–1.030)
pH: 8 (ref 5.0–8.0)

## 2019-09-14 LAB — RAPID URINE DRUG SCREEN, HOSP PERFORMED
Amphetamines: NOT DETECTED
Barbiturates: NOT DETECTED
Benzodiazepines: POSITIVE — AB
Cocaine: NOT DETECTED
Opiates: NOT DETECTED
Tetrahydrocannabinol: NOT DETECTED

## 2019-09-14 LAB — AMMONIA: Ammonia: 26 umol/L (ref 9–35)

## 2019-09-14 LAB — SARS CORONAVIRUS 2 (TAT 6-24 HRS): SARS Coronavirus 2: NEGATIVE

## 2019-09-14 LAB — TROPONIN I (HIGH SENSITIVITY)
Troponin I (High Sensitivity): 11 ng/L (ref <18)
Troponin I (High Sensitivity): 12 ng/L (ref <18)

## 2019-09-14 LAB — SALICYLATE LEVEL: Salicylate Lvl: <7 mg/dL (ref 2.8–30.0)

## 2019-09-14 LAB — POC SARS CORONAVIRUS 2 AG -  ED: SARS Coronavirus 2 Ag: NEGATIVE

## 2019-09-14 LAB — ETHANOL: Alcohol, Ethyl (B): <10 mg/dL (ref <10)

## 2019-09-14 LAB — ACETAMINOPHEN LEVEL: Acetaminophen (Tylenol), Serum: <10 ug/mL — ABNORMAL LOW (ref 10–30)

## 2019-09-14 MED ORDER — LISINOPRIL 2.5 MG PO TABS
2.5000 mg | ORAL_TABLET | Freq: Every day | ORAL | Status: DC
  Administered 2019-09-14 – 2019-09-16 (×3): 2.5 mg via ORAL
  Filled 2019-09-14 (×3): qty 1

## 2019-09-14 MED ORDER — AMMONIA AROMATIC IN INHA
RESPIRATORY_TRACT | Status: AC
  Administered 2019-09-14: 04:00:00
  Filled 2019-09-14: qty 10

## 2019-09-14 MED ORDER — ACETAMINOPHEN 325 MG PO TABS: 650.0000 mg | ORAL_TABLET | ORAL | Status: DC | PRN

## 2019-09-14 MED ORDER — PANTOPRAZOLE SODIUM 40 MG PO TBEC
40.0000 mg | DELAYED_RELEASE_TABLET | Freq: Two times a day (BID) | ORAL | Status: DC
  Administered 2019-09-14 – 2019-09-16 (×5): 40 mg via ORAL
  Filled 2019-09-14 (×5): qty 1

## 2019-09-14 MED ORDER — PROPRANOLOL HCL 10 MG PO TABS
10.0000 mg | ORAL_TABLET | Freq: Every day | ORAL | Status: DC
  Administered 2019-09-14 – 2019-09-16 (×4): 10 mg via ORAL
  Filled 2019-09-14 (×3): qty 1

## 2019-09-14 MED ORDER — DOXYCYCLINE HYCLATE 100 MG PO TABS
100.0000 mg | ORAL_TABLET | Freq: Two times a day (BID) | ORAL | Status: DC
  Administered 2019-09-14 – 2019-09-16 (×5): 100 mg via ORAL
  Filled 2019-09-14 (×6): qty 1

## 2019-09-14 MED ORDER — HALOPERIDOL LACTATE 5 MG/ML IJ SOLN
2.0000 mg | Freq: Once | INTRAMUSCULAR | Status: AC
  Administered 2019-09-14: 2 mg via INTRAVENOUS
  Filled 2019-09-14: qty 1

## 2019-09-14 MED ORDER — ZIPRASIDONE MESYLATE 20 MG IM SOLR
10.0000 mg | Freq: Once | INTRAMUSCULAR | Status: AC
  Administered 2019-09-14: 10 mg via INTRAMUSCULAR
  Filled 2019-09-14: qty 20

## 2019-09-14 MED ORDER — INSULIN ASPART 100 UNIT/ML ~~LOC~~ SOLN
25.0000 [IU] | Freq: Three times a day (TID) | SUBCUTANEOUS | Status: DC
  Administered 2019-09-14 – 2019-09-16 (×4): 25 [IU] via SUBCUTANEOUS
  Filled 2019-09-14: qty 0.25

## 2019-09-14 MED ORDER — STERILE WATER FOR INJECTION IJ SOLN
INTRAMUSCULAR | Status: AC
  Administered 2019-09-14: 04:00:00
  Filled 2019-09-14: qty 10

## 2019-09-14 MED ORDER — PREDNISONE 20 MG PO TABS
60.0000 mg | ORAL_TABLET | Freq: Every day | ORAL | Status: DC
  Administered 2019-09-14 – 2019-09-16 (×3): 60 mg via ORAL
  Filled 2019-09-14 (×3): qty 3

## 2019-09-14 MED ORDER — UMECLIDINIUM BROMIDE 62.5 MCG/INH IN AEPB
1.0000 | INHALATION_SPRAY | Freq: Every day | RESPIRATORY_TRACT | Status: DC
  Administered 2019-09-14 – 2019-09-16 (×3): 1 via RESPIRATORY_TRACT
  Filled 2019-09-14: qty 7

## 2019-09-14 MED ORDER — ALUM & MAG HYDROXIDE-SIMETH 200-200-20 MG/5ML PO SUSP: 30.0000 mL | Freq: Four times a day (QID) | ORAL | Status: DC | PRN

## 2019-09-14 MED ORDER — POLYETHYLENE GLYCOL 3350 17 G PO PACK
17.0000 g | PACK | Freq: Every day | ORAL | Status: DC | PRN
  Administered 2019-09-15: 17 g via ORAL
  Filled 2019-09-14 (×2): qty 1

## 2019-09-14 MED ORDER — TIOTROPIUM BROMIDE MONOHYDRATE 18 MCG IN CAPS: 18.0000 ug | ORAL_CAPSULE | Freq: Every day | RESPIRATORY_TRACT | Status: DC

## 2019-09-14 MED ORDER — DIPHENHYDRAMINE HCL 50 MG/ML IJ SOLN
12.5000 mg | Freq: Once | INTRAMUSCULAR | Status: AC
  Administered 2019-09-14: 12.5 mg via INTRAVENOUS
  Filled 2019-09-14: qty 1

## 2019-09-14 MED ORDER — HALOPERIDOL LACTATE 5 MG/ML IJ SOLN
3.0000 mg | Freq: Once | INTRAMUSCULAR | Status: AC
  Administered 2019-09-14: 3 mg via INTRAVENOUS

## 2019-09-14 MED ORDER — CITALOPRAM HYDROBROMIDE 10 MG PO TABS
20.0000 mg | ORAL_TABLET | Freq: Every day | ORAL | Status: DC
  Administered 2019-09-14 – 2019-09-16 (×4): 20 mg via ORAL
  Filled 2019-09-14 (×3): qty 2

## 2019-09-14 MED ORDER — NALOXONE HCL 2 MG/2ML IJ SOSY
2.0000 mg | PREFILLED_SYRINGE | Freq: Once | INTRAMUSCULAR | Status: AC
  Administered 2019-09-14: 2 mg via INTRAVENOUS
  Filled 2019-09-14: qty 2

## 2019-09-14 MED ORDER — ATORVASTATIN CALCIUM 40 MG PO TABS
40.0000 mg | ORAL_TABLET | Freq: Every day | ORAL | Status: DC
  Administered 2019-09-14 – 2019-09-15 (×2): 40 mg via ORAL
  Filled 2019-09-14 (×3): qty 1

## 2019-09-14 MED ORDER — GABAPENTIN 400 MG PO CAPS
400.0000 mg | ORAL_CAPSULE | Freq: Four times a day (QID) | ORAL | Status: DC
  Administered 2019-09-14 – 2019-09-16 (×9): 400 mg via ORAL
  Filled 2019-09-14 (×9): qty 1

## 2019-09-14 MED ORDER — ALBUTEROL SULFATE HFA 108 (90 BASE) MCG/ACT IN AERS: 1.0000 | INHALATION_SPRAY | Freq: Four times a day (QID) | RESPIRATORY_TRACT | Status: DC | PRN

## 2019-09-14 MED ORDER — TORSEMIDE 20 MG PO TABS
40.0000 mg | ORAL_TABLET | Freq: Two times a day (BID) | ORAL | Status: DC
  Administered 2019-09-14 – 2019-09-16 (×5): 40 mg via ORAL
  Filled 2019-09-14 (×6): qty 2

## 2019-09-14 NOTE — ED Notes (Signed)
Hospitalist said ok for pt to be on 5L O2 while awake only.

## 2019-09-14 NOTE — Consult Note (Addendum)
Patient discussed and case reviewed. Patient remains difficult to arouse after receiving Haldol and Geodon last night. Will continue to recommend inpatient admission at this time. WIll order UA, which is required for admission to geropsych.   Patient seen face-to-face for psychiatric evaluation, chart reviewed and case discussed with the physician extender and developed treatment plan. Reviewed the information documented and agree with the treatment plan. Corena Pilgrim, MD

## 2019-09-14 NOTE — ED Notes (Signed)
Pt ripped his brief off, threw brief and bedpan in the floor. Pt yelling from his room: "I can't breathe with this mask on and it's too hot in this room. You're all trying to kill me". Pt repositioned again, and reminded that no one was trying to kill him, but only trying to take care of him. Pt again became verbally aggressive with staff when attempting to speak with him.

## 2019-09-14 NOTE — ED Provider Notes (Signed)
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 Stable cardiomegaly with large hiatal hernia. Electronically Signed   By: Lovena Le M.D.   On: 09/14/2019 01:53   Dg Chest Portable 1 View  Result Date: 09/13/2019 CLINICAL DATA:  Shortness of breath EXAM: PORTABLE CHEST 1 VIEW COMPARISON:  09/06/2019 FINDINGS: Moderate cardiomegaly with bilateral basilar predominant interstitial opacity. Remote median sternotomy. Small left pleural effusion. IMPRESSION: Cardiomegaly with small left pleural effusion and basilar predominant interstitial opacities that may indicate early pulmonary edema. Electronically Signed   By: Ulyses Jarred M.D.   On: 09/13/2019 02:43   Dg Chest Port 1 View  Result Date: 09/06/2019 CLINICAL DATA:  Pulmonary disease EXAM: PORTABLE CHEST 1 VIEW COMPARISON:  Yesterday FINDINGS: Chronic cardiomegaly. Hiatal hernia. Stable  mild interstitial coarsening without Kerley lines. No air bronchogram. There is a bleb at the left apex. Prior CABG. IMPRESSION: Stable interstitial coarsening. Cardiomegaly and hiatal hernia. Electronically Signed   By: Monte Fantasia M.D.   On: 09/06/2019 06:21   Dg Chest Portable 1 View  Result Date: 09/05/2019 CLINICAL DATA:  Dyspnea and shortness of breath for EXAM: PORTABLE CHEST 1 VIEW COMPARISON:  August 13, 2019 FINDINGS: Again noted is mild cardiomegaly. There is prominence to the central pulmonary vasculature. Hazy airspace opacity seen at the left lower lung not significantly changed since the prior exam. There is also blunting of the left costophrenic angle, likely small effusion. Overlying median sternotomy wires. IMPRESSION: Pulmonary vascular congestion and hazy airspace opacity at the left lung base which could be layering effusion and/or early infectious etiology. This is not significantly changed since the prior exam. Trace left pleural effusion. Electronically Signed   By: Prudencio Pair M.D.   On: 09/05/2019 10:16    EKG EKG Interpretation  Date/Time:  Saturday September 14 2019 01:28:25 EST Ventricular Rate:  95 PR Interval:    QRS Duration: 135 QT Interval:  408 QTC Calculation: 513 R Axis:   -61 Text Interpretation: Sinus tachycardia Atrial premature complexes RBBB and LAFB Confirmed by Randal Buba, Sherrill Buikema ([id number]) on 09/14/2019 1:33:33 AM   Radiology Ct Head Wo Contrast  Result Date: 09/14/2019 CLINICAL DATA:  Attempted suicide, ingestion EXAM: CT HEAD WITHOUT CONTRAST TECHNIQUE: Contiguous axial images were obtained from the base of the skull through the vertex without intravenous contrast. COMPARISON:  MRI 05/05/2019, CT 05/05/2019 FINDINGS: Brain: No evidence of acute infarction, hemorrhage, hydrocephalus, extra-axial collection or mass lesion/mass effect. Symmetric prominence of the ventricles, cisterns and sulci compatible with parenchymal volume loss. Patchy areas of  white matter hypoattenuation are most compatible with chronic microvascular angiopathy. Vascular: Atherosclerotic calcification of the carotid siphons and intradural vertebral arteries. No hyperdense vessel. Skull: No calvarial fracture or suspicious osseous lesion. No scalp swelling or hematoma. Sinuses/Orbits: Scattered mural thickening in the ethmoids. No air-fluid levels. Remaining paranasal sinuses are predominantly clear. Left mastoid effusion. No right mastoid effusion. Included orbital structures are unremarkable aside from right lens extraction. Other: None IMPRESSION: No acute intracranial abnormality. Stable moderate parenchymal volume loss and chronic white matter angiopathy. Intracranial atherosclerosis. Left mastoid effusion. Electronically Signed   By: Lovena Le M.D.   On: 09/14/2019 02:09   Ct Angio Chest Pe W And/or Wo Contrast  Result Date: 09/13/2019 CLINICAL DATA:  Shortness of breath with sharp chest pain for 5 hours EXAM: CT ANGIOGRAPHY CHEST WITH CONTRAST TECHNIQUE: Multidetector CT imaging of the chest was performed using the standard protocol during bolus administration of intravenous contrast. Multiplanar CT image reconstructions and MIPs were obtained to evaluate the vascular anatomy. CONTRAST:  75m OMNIPAQUE IOHEXOL 350 MG/ML SOLN COMPARISON:  CTA chest 03/30/2019 FINDINGS: Cardiovascular: Satisfactory opacification the pulmonary arteries to the segmental level. No pulmonary artery filling defects are identified. Central pulmonary arteries are normal caliber. No elevation of the RV/LV ratio (0.8). Cardiac size at the upper limits normal. Postsurgical changes from prior CABG. Calcification of the native coronary arteries is noted. No pericardial effusion. Calcifications of the normal caliber thoracic aorta. Shared origin of the brachiocephalic and left common carotid artery. There is atheromatous plaque within the proximal great vessels. Mediastinum/Nodes: There is a large hiatal  hernia containing much of the proximal stomach and mesentery. No mediastinal fluid or gas. Mildly nodular thyroid gland, similar to comparison. No acute abnormality of the trachea or esophagus. No mediastinal, hilar or axillary adenopathy. Lungs/Pleura: Centrilobular and paraseptal emphysema with a large left apical bulla. Bandlike area of scarring seen along the left major fissure. Additional architectural distortion in the lingula. Additional scarring or atelectasis seen in both lung bases. No consolidation, features of edema, pneumothorax, or effusion. Upper Abdomen: Hiatal hernia, as above Scattered colonic diverticula without focal pericolonic inflammation to suggest diverticulitis. No acute abnormalities present in the visualized portions of the upper abdomen. Musculoskeletal: Multilevel degenerative changes are present in the imaged portions of the spine. Post sternotomy changes are noted. No acute osseous abnormality or suspicious osseous lesion. Review of the MIP images confirms the above findings. IMPRESSION: 1. No acute or chronic pulmonary emboli visualized. 2. Large hiatal hernia containing much of the proximal stomach and adjacent mesentery. 3. Emphysema with bandlike areas of scarring and left apical bullous disease. 4. Emphysema (ICD10-J43.9). 5. Aortic Atherosclerosis (ICD10-I70.0) 6. Coronary artery disease.  Prior CABG. 7. Colonic diverticulosis. Electronically Signed   By: PLovena LeM.D.   On: 09/13/2019 04:20   Dg Chest Portable 1 View  Result Date: 09/14/2019 CLINICAL DATA:  Shortness of breath began 5 hours prior, sharp pressure, pain radiating to chest EXAM: PORTABLE CHEST 1 VIEW COMPARISON:  CTA 09/13/2019 FINDINGS: Emphysematous changes and architectural distortion in the lung bases, similar to comparison imaging from 1 day prior. Some bandlike areas of opacity are compatible with additional atelectasis. No pneumothorax. No visible effusion. Cardiomegaly and a large hiatal hernia  are unchanged from comparison. Postsurgical changes related to prior CABG including intact and aligned sternotomy wires and multiple surgical clips projecting over the mediastinum. No acute osseous or soft tissue abnormality. IMPRESSION: 1. Basilar atelectasis on a background of emphysema and scarring, not significantly changed from 1 day prior 2. Stable cardiomegaly with large hiatal hernia. Electronically Signed   By: PLovena LeM.D.   On: 09/14/2019 01:53   Dg Chest Portable 1 View  Result Date: 09/13/2019 CLINICAL DATA:  Shortness of breath EXAM: PORTABLE CHEST 1 VIEW COMPARISON:  09/06/2019 FINDINGS: Moderate cardiomegaly with bilateral basilar predominant interstitial opacity. Remote median sternotomy. Small left pleural effusion. IMPRESSION: Cardiomegaly with small left pleural effusion and basilar predominant interstitial opacities that may indicate early pulmonary edema. Electronically Signed   By: KUlyses JarredM.D.   On: 09/13/2019 02:43    Procedures Procedures (including critical care time)  Medications Ordered in ED Medications  ammonia inhalant (has no  administration in time range)  haloperidol lactate (HALDOL) injection 3 mg (has no administration in time range)  naloxone [facility]) injection 2 mg (2 mg Intravenous Given 09/14/19 0127)  haloperidol lactate (HALDOL) injection 2 mg (2 mg Intravenous Given 09/14/19 0229)    130 Patient had a staring spell, had a gag reflex.  Given narcan and taken off increased oxygen 2 minutes later patient was awake and alert and back to his baseline normal aggressive agitated self.     Initial Impression / Assessment and Plan / ED Course   232 Case discussed with Dr. Jonelle Sidle, of hospitalist service, nothing to admit medically for at this time.    GRASON BRAILSFORD was evaluated in Emergency Department on 09/14/2019 for the symptoms described in the history of present illness. He was evaluated in the context of the global COVID-19 pandemic, which  necessitated consideration that the patient might be at risk for infection with the SARS-CoV-2 virus that causes COVID-19. Institutional protocols and algorithms that pertain to the evaluation of patients at risk for COVID-19 are in a state of rapid change based on information released by regulatory bodies including the CDC and federal and state organizations. These policies and algorithms were followed during the patient's care in the ED. Final Clinical Impressions(s) / ED Diagnoses   Final diagnoses:  Suicide attempt [facility])  Chronic obstructive pulmonary disease, unspecified COPD type ([place])    Awaiting TTS consult.  Holding orders placed.     Abubakr Wieman, MD 09/14/19 [id number]

## 2019-09-14 NOTE — ED Notes (Signed)
Pt continues to sleep and only cracks eyes open to stimulus. Slept through VS being taken. VS [facility]. Informed Dr. Zenia Resides that insulin 25 units held because pt is still sleeping and not eating meals yet. O2 sats = 98 on 2 L [place].

## 2019-09-14 NOTE — ED Notes (Signed)
Pt again getting verbally aggressive with staff. Writer asked pt to refrain from cussing. Pt said "well fuck ya". Security called to bedside to complete orders on pt.

## 2019-09-14 NOTE — ED Notes (Signed)
The entire time I cared for him he was soundly sleeping. He is brought to TCU in no distress at this time. He stands and voids upon arrival to [place].
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
## 2019-09-14 NOTE — ED Notes (Signed)
While undressing pt, pt found with two skin tears. One skin tear to his rt wrist, and the second below his rt elbow. [name], RN aware and at bedside to assess.
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
## 2019-09-14 NOTE — ED Notes (Signed)
Pt's belongings removed from room and pt dressed into scrubs.  Belongings include: navy shirt, underwear and a watch.

## 2019-09-14 NOTE — ED Notes (Signed)
Pt calling out that he needs to have a BM. Pt placed on bedpan and is attempting to have a BM.

## 2019-09-14 NOTE — BH Assessment (Addendum)
Per [name], FNP patient meets geropsych placement criteria. Pt has a lot of medical issues. TTS to seek placement. Patient referred to the following hospitals for consideration of an inpatient admission:  [facility] due to medical acuity (oxygen) [facility]   [facility]   [place]  [facility]   [facility]  [facility]

## 2019-09-14 NOTE — ED Notes (Signed)
Pt became responsive at this time, crying and yelling and very upset. Stating he wants to die and is going to die.
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
## 2019-09-14 NOTE — ED Notes (Signed)
Pt in bed sleeping. Tried to arouse him to give medication but he only cracked his eyes open and would not become fully roused enough to take medication.

## 2019-09-14 NOTE — ED Notes (Signed)
Pt asked if he would like to be taken off the bedpan at this time, pt denies wanting to be taken off.

## 2019-09-14 NOTE — ED Notes (Signed)
Pt became catatonic and would not respond to verbal and painful stimuli. EDP made aware and came to bedside.

## 2019-09-14 NOTE — BH Assessment (Signed)
[facility] Assessment Progress Note   Clinician informed Dr. Randal Buba of recommendation for geropsych placement for patient.  TTS to seek placement.
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
## 2019-09-14 NOTE — ED Notes (Signed)
Pt seems to be depressed that his wife has not come to get him or that she has not called at least. He is cooperative at this time and has been today. Not demonstrating the behaviors that he had on admission.

## 2019-09-14 NOTE — ED Notes (Signed)
TTS at bedside. 
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
## 2019-09-14 NOTE — ED Notes (Signed)
Pt found getting out of bed and voiding in the sink. Pt states "I am so sorry, I woke up and had no idea where I was. I just woke up and knew I had to pee". Pt assisted back in the bed, and new condom cath applied. Pt also placed back on the monitor and given call bell. Pt reminded to not get out of the bed without assist. Pt also placed on bedpan at request.

## 2019-09-14 NOTE — ED Notes (Signed)
Pt continues to try and get out of bed and removes his oxygen and screams at staff despite multiple redirection and therapeutic communication to try and deescalate pt.

## 2019-09-14 NOTE — ED Notes (Signed)
Pt's condom cath removed again, by pt. Pt cleaned after voiding on himself. Linens changed and pt repositioned. Pt given warm blankets and call bell.

## 2019-09-15 LAB — CBC
HCT: 37 % — ABNORMAL LOW (ref 39.0–52.0)
Hemoglobin: 10.7 g/dL — ABNORMAL LOW (ref 13.0–17.0)
MCH: 26.5 pg (ref 26.0–34.0)
MCHC: 28.9 g/dL — ABNORMAL LOW (ref 30.0–36.0)
MCV: 91.6 fL (ref 80.0–100.0)
Platelets: 301 10*3/uL (ref 150–400)
RBC: 4.04 MIL/uL — ABNORMAL LOW (ref 4.22–5.81)
RDW: 16 % — ABNORMAL HIGH (ref 11.5–15.5)
WBC: 15.2 10*3/uL — ABNORMAL HIGH (ref 4.0–10.5)
nRBC: 0 % (ref 0.0–0.2)

## 2019-09-15 LAB — COMPREHENSIVE METABOLIC PANEL
ALT: 17 U/L (ref 0–44)
AST: 15 U/L (ref 15–41)
Albumin: 3.6 g/dL (ref 3.5–5.0)
Alkaline Phosphatase: 102 U/L (ref 38–126)
Anion gap: 14 (ref 5–15)
BUN: 39 mg/dL — ABNORMAL HIGH (ref 8–23)
CO2: 34 mmol/L — ABNORMAL HIGH (ref 22–32)
Calcium: 8.6 mg/dL — ABNORMAL LOW (ref 8.9–10.3)
Chloride: 86 mmol/L — ABNORMAL LOW (ref 98–111)
Creatinine, Ser: 1.45 mg/dL — ABNORMAL HIGH (ref 0.61–1.24)
GFR calc Af Amer: 53 mL/min — ABNORMAL LOW (ref >60)
GFR calc non Af Amer: 46 mL/min — ABNORMAL LOW (ref >60)
Glucose, Bld: 96 mg/dL (ref 70–99)
Potassium: 3.9 mmol/L (ref 3.5–5.1)
Sodium: 134 mmol/L — ABNORMAL LOW (ref 135–145)
Total Bilirubin: 0.9 mg/dL (ref 0.3–1.2)
Total Protein: 7.1 g/dL (ref 6.5–8.1)

## 2019-09-15 LAB — HEMOGLOBIN A1C
Hgb A1c MFr Bld: 6.6 % — ABNORMAL HIGH (ref 4.8–5.6)
Mean Plasma Glucose: 142.72 mg/dL

## 2019-09-15 LAB — CBG MONITORING, ED
Glucose-Capillary: 192 mg/dL — ABNORMAL HIGH (ref 70–99)
Glucose-Capillary: 95 mg/dL (ref 70–99)
Glucose-Capillary: 281 mg/dL — ABNORMAL HIGH (ref 70–99)
Glucose-Capillary: 163 mg/dL — ABNORMAL HIGH (ref 70–99)

## 2019-09-15 NOTE — Progress Notes (Signed)
PROGRESS NOTE    [name]  [id number] DOB: [birth year] DOA: 09/13/2019 PCP: [name], MD Brief Narrative: He has been monitored in the emergency department since no bed has become available on the floor.  He is comfortable has no pain or shortness of breath would like to go to a regular bed.   Assessment & Plan:   Principal Problem:   Major depressive disorder, recurrent, severe without psychotic features ([place]) Drug overdose Will provide her with some IV fluid support continue to monitor his kidney function and wait for psych facility to open up for placement  DVT prophylaxis: Lovenox Code Status: Full code Family Communication:  Disposition Plan: Needs psych inpatient placement   Consultants: Psychiatry  Procedures:  Antimicrobials:    Subjective:  Objective: Vitals:   09/15/19 0117 09/15/19 0643 09/15/19 0816 09/15/19 1200  BP: 125/81 138/73 122/73 109/85  Pulse: 93 94 (!) 111 (!) 101  Resp: 20 20 18 20   Temp: 98.1 F (36.7 C) 97.8 F (36.6 C) 98.3 F (36.8 C) 98.3 F (36.8 C)  TempSrc: Oral Oral Oral Oral  SpO2: 98% 94% 94% 94%  Weight:      Height:        Intake/Output Summary (Last 24 hours) at 09/15/2019 1422 Last data filed at 09/15/2019 1202 Gross per 24 hour  Intake 1320 ml  Output 1040 ml  Net 280 ml   Filed Weights   09/13/19 2315  Weight: 126.1 kg    Examination:  General exam: Appears calm and comfortable  Respiratory system: Clear to auscultation. Respiratory effort normal. Cardiovascular system: S1 & S2 heard, RRR. No JVD, murmurs, rubs, gallops or clicks. No pedal edema. Gastrointestinal system: Abdomen is nondistended, soft and nontender. No organomegaly or masses felt. Normal bowel sounds heard. Central nervous system: Alert and oriented. No focal neurological deficits. Extremities: Symmetric 5 x 5 power. Skin: No rashes, lesions or ulcers Psychiatry: Judgement and insight appear normal. Mood & affect appropriate.   Is insightful and understands the implications well    Data Reviewed: I have personally reviewed following labs and imaging studies hemoglobin is 10.7 sodium is now up to 134 potassium is 3.9 BUN is 39 creatinine has gone up to 1.45.  CBC: Recent Labs  Lab 09/13/19 0146 09/13/19 2329 09/15/19 1140  WBC 10.4 9.7 15.2*  NEUTROABS 7.7 7.7  --   HGB 9.1* 10.2* 10.7*  HCT 30.7* 34.2* 37.0*  MCV 89.8 90.0 91.6  PLT 197 227 [id number]   Basic Metabolic Panel: Recent Labs  Lab 09/13/19 0146 09/13/19 2329 09/15/19 1140  NA 131* 131* 134*  K 4.0 4.3 3.9  CL 84* 85* 86*  CO2 37* 35* 34*  GLUCOSE 119* 169* 96  BUN 21 23 39*  CREATININE 1.18 0.97 1.45*  CALCIUM 8.1* 8.7* 8.6*   GFR: Estimated Creatinine Clearance: 61.9 mL/min (A) (by C-G formula based on SCr of 1.45 mg/dL (H)). Liver Function Tests: Recent Labs  Lab 09/13/19 0146 09/13/19 2329 09/15/19 1140  AST 14* 14* 15  ALT 16 20 17   ALKPHOS 97 109 102  BILITOT 0.5 0.7 0.9  PROT 6.1* 7.2 7.1  ALBUMIN 3.1* 3.7 3.6   No results for input(s): LIPASE, AMYLASE in the last 168 hours. Recent Labs  Lab 09/14/19 0138  AMMONIA 26   Coagulation Profile: No results for input(s): INR, PROTIME in the last 168 hours. Cardiac Enzymes: No results for input(s): CKTOTAL, CKMB, CKMBINDEX, TROPONINI in the last 168 hours. BNP (last 3 results) No results  for input(s): PROBNP in the last 8760 hours. HbA1C: No results for input(s): HGBA1C in the last 72 hours. CBG: Recent Labs  Lab 09/14/19 1117 09/14/19 1618 09/14/19 1847 09/15/19 0742 09/15/19 1115  GLUCAP 138* 223* 247* 192* 95   Lipid Profile: No results for input(s): CHOL, HDL, LDLCALC, TRIG, CHOLHDL, LDLDIRECT in the last 72 hours. Thyroid Function Tests: No results for input(s): TSH, T4TOTAL, FREET4, T3FREE, THYROIDAB in the last 72 hours. Anemia Panel: No results for input(s): VITAMINB12, FOLATE, FERRITIN, TIBC, IRON, RETICCTPCT in the last 72 hours. Sepsis Labs: No  results for input(s): PROCALCITON, LATICACIDVEN in the last 168 hours.  Recent Results (from the past 240 hour(s))  MRSA PCR Screening     Status: None   Collection Time: 09/05/19  3:15 PM   Specimen: Nasopharyngeal  Result Value Ref Range Status   MRSA by PCR NEGATIVE NEGATIVE Final    Comment:        The GeneXpert MRSA Assay (FDA approved for NASAL specimens only), is one component of a comprehensive MRSA colonization surveillance program. It is not intended to diagnose MRSA infection nor to guide or monitor treatment for MRSA infections. Performed at [facility], [place], [place] [address]   SARS CORONAVIRUS 2 (TAT 6-24 HRS) Nasopharyngeal Nasopharyngeal Swab     Status: None   Collection Time: 09/14/19  1:37 AM   Specimen: Nasopharyngeal Swab  Result Value Ref Range Status   SARS Coronavirus 2 NEGATIVE NEGATIVE Final    Comment: (NOTE) SARS-CoV-2 target nucleic acids are NOT DETECTED. The SARS-CoV-2 RNA is generally detectable in upper and lower respiratory specimens during the acute phase of infection. Negative results do not preclude SARS-CoV-2 infection, do not rule out co-infections with other pathogens, and should not be used as the sole basis for treatment or other patient management decisions. Negative results must be combined with clinical observations, patient history, and epidemiological information. The expected result is Negative. Fact Sheet for Patients: SugarRoll.be Fact Sheet for Healthcare Providers: https://www.woods-mathews.com/ This test is not yet approved or cleared by the [place] FDA and  has been authorized for detection and/or diagnosis of SARS-CoV-2 by FDA under an Emergency Use Authorization (EUA). This EUA will remain  in effect (meaning this test can be used) for the duration of the COVID-19 declaration under Section 56 4(b)(1) of the Act, 21 U.S.C. section  360bbb-3(b)(1), unless the authorization is terminated or revoked sooner. Performed at [facility], [place] [address] [place], [place] [address]          Radiology Studies: Ct Head Wo Contrast  Result Date: 09/14/2019 CLINICAL DATA:  Attempted suicide, ingestion EXAM: CT HEAD WITHOUT CONTRAST TECHNIQUE: Contiguous axial images were obtained from the base of the skull through the vertex without intravenous contrast. COMPARISON:  MRI 05/05/2019, CT 05/05/2019 FINDINGS: Brain: No evidence of acute infarction, hemorrhage, hydrocephalus, extra-axial collection or mass lesion/mass effect. Symmetric prominence of the ventricles, cisterns and sulci compatible with parenchymal volume loss. Patchy areas of white matter hypoattenuation are most compatible with chronic microvascular angiopathy. Vascular: Atherosclerotic calcification of the carotid siphons and intradural vertebral arteries. No hyperdense vessel. Skull: No calvarial fracture or suspicious osseous lesion. No scalp swelling or hematoma. Sinuses/Orbits: Scattered mural thickening in the ethmoids. No air-fluid levels. Remaining paranasal sinuses are predominantly clear. Left mastoid effusion. No right mastoid effusion. Included orbital structures are unremarkable aside from right lens extraction. Other: None IMPRESSION: No acute intracranial abnormality. Stable moderate parenchymal volume loss and chronic  white matter angiopathy. Intracranial atherosclerosis. Left mastoid effusion. Electronically Signed   By: Lovena Le M.D.   On: 09/14/2019 02:09   Dg Chest Portable 1 View  Result Date: 09/14/2019 CLINICAL DATA:  Shortness of breath began 5 hours prior, sharp pressure, pain radiating to chest EXAM: PORTABLE CHEST 1 VIEW COMPARISON:  CTA 09/13/2019 FINDINGS: Emphysematous changes and architectural distortion in the lung bases, similar to comparison imaging from 1 day prior. Some bandlike areas of opacity are compatible with additional  atelectasis. No pneumothorax. No visible effusion. Cardiomegaly and a large hiatal hernia are unchanged from comparison. Postsurgical changes related to prior CABG including intact and aligned sternotomy wires and multiple surgical clips projecting over the mediastinum. No acute osseous or soft tissue abnormality. IMPRESSION: 1. Basilar atelectasis on a background of emphysema and scarring, not significantly changed from 1 day prior 2. Stable cardiomegaly with large hiatal hernia. Electronically Signed   By: Lovena Le M.D.   On: 09/14/2019 01:53        Scheduled Meds: . atorvastatin  40 mg Oral q1800  . citalopram  20 mg Oral Daily  . doxycycline  100 mg Oral BID  . gabapentin  400 mg Oral QID  . insulin aspart  25 Units Subcutaneous TID WC  . lisinopril  2.5 mg Oral Daily  . pantoprazole  40 mg Oral BID  . predniSONE  60 mg Oral Daily  . propranolol  10 mg Oral Daily  . torsemide  40 mg Oral BID  . umeclidinium bromide  1 puff Inhalation Daily   Continuous Infusions:   LOS: 0 days    Time spent: 20    Pearlean Brownie, MD [facility] Pager [phone]  If 7PM-7AM, please contact night-coverage [email or web address] Password [email or web address] 09/15/2019, 2:22 PM

## 2019-09-15 NOTE — Consult Note (Addendum)
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 Value Ref Range   Glucose-Capillary 95 70 - 99 mg/dL  CBC     Status: Abnormal   Collection Time: 09/15/19 11:40 AM  Result Value Ref Range   WBC 15.2 (H) 4.0 - 10.5 K/uL   RBC 4.04 (L) 4.22 - 5.81 MIL/uL   Hemoglobin 10.7 (L) 13.0 - 17.0 g/dL   HCT 37.0 (L) 39.0 - 52.0 %   MCV 91.6 80.0 - 100.0 fL   MCH 26.5 26.0 - 34.0 pg   MCHC 28.9 (L) 30.0 - 36.0 g/dL   RDW 16.0 (H) 11.5 - 15.5 %   Platelets 301 150 - 400 K/uL   nRBC 0.0 0.0 - 0.2 %    Comment: Performed at [facility], [place] [address], [place], [place] [address]  Comprehensive metabolic panel     Status: Abnormal   Collection Time:  09/15/19 11:40 AM  Result Value Ref Range   Sodium 134 (L) 135 - 145 mmol/L   Potassium 3.9 3.5 - 5.1 mmol/L   Chloride 86 (L) 98 - 111 mmol/L   CO2 34 (H) 22 - 32 mmol/L   Glucose, Bld 96 70 - 99 mg/dL   BUN 39 (H) 8 - 23 mg/dL   Creatinine, Ser 1.45 (H) 0.61 - 1.24 mg/dL   Calcium 8.6 (L) 8.9 - 10.3 mg/dL   Total Protein 7.1 6.5 - 8.1 g/dL   Albumin 3.6 3.5 - 5.0 g/dL   AST 15 15 - 41 U/L   ALT 17 0 - 44 U/L   Alkaline Phosphatase 102 38 - 126 U/L   Total Bilirubin 0.9 0.3 - 1.2 mg/dL   GFR calc non Af Amer 46 (  L) >60 mL/min   GFR calc Af Amer 53 (L) >60 mL/min   Anion gap 14 5 - 15    Comment: Performed at [facility], [place] [address] [place],  [address]    Current Facility-Administered Medications  Medication Dose Route Frequency Provider Last Rate Last Dose  . acetaminophen (TYLENOL) tablet 650 mg  650 mg Oral Q4H PRN Palumbo, April, MD      . albuterol (VENTOLIN HFA) 108 (90 Base) MCG/ACT inhaler 1-2 puff  1-2 puff Inhalation Q6H PRN Palumbo, April, MD      . alum & mag hydroxide-simeth (MAALOX/MYLANTA) 200-200-20 MG/5ML suspension 30 mL  30 mL Oral Q6H PRN Palumbo, April, MD      . atorvastatin (LIPITOR) tablet 40 mg  40 mg Oral q1800 Palumbo, April, MD   40 mg at 09/14/19 1847  . citalopram (CELEXA) tablet 20 mg  20 mg Oral Daily Palumbo, April, MD   20 mg at 09/15/19 0931  . doxycycline (VIBRA-TABS) tablet 100 mg  100 mg Oral BID Palumbo, April, MD   100 mg at 09/15/19 0931  . gabapentin (NEURONTIN) capsule 400 mg  400 mg Oral QID Palumbo, April, MD   400 mg at 09/15/19 0929  . insulin aspart (novoLOG) injection 25 Units  25 Units Subcutaneous TID WC Palumbo, April, MD   25 Units at 09/15/19 0834  . lisinopril (ZESTRIL) tablet 2.5 mg  2.5 mg Oral Daily Palumbo, April, MD   2.5 mg at 09/15/19 0930  . pantoprazole (PROTONIX) EC tablet 40 mg  40 mg Oral BID Palumbo, April, MD   40 mg at 09/15/19 0931  . polyethylene glycol (MIRALAX / GLYCOLAX) packet  17 g  17 g Oral Daily PRN Palumbo, April, MD   17 g at 09/15/19 4166  . predniSONE (DELTASONE) tablet 60 mg  60 mg Oral Daily Palumbo, April, MD   60 mg at 09/15/19 0931  . propranolol (INDERAL) tablet 10 mg  10 mg Oral Daily Palumbo, April, MD   10 mg at 09/15/19 0930  . torsemide (DEMADEX) tablet 40 mg  40 mg Oral BID Palumbo, April, MD   40 mg at 09/15/19 0830  . umeclidinium bromide (INCRUSE ELLIPTA) 62.5 MCG/INH 1 puff  1 puff Inhalation Daily Palumbo, April, MD   1 puff at 09/15/19 0630   Current Outpatient Medications  Medication Sig Dispense Refill  . acetaminophen (TYLENOL) 325 MG tablet Take 650 mg by mouth every 6 (six) hours as needed.    Marland Kitchen albuterol (VENTOLIN HFA) 108 (90 Base) MCG/ACT inhaler Inhale 1-2 puffs into the lungs every 6 (six) hours as needed for wheezing or shortness of breath.    . ALPRAZolam (XANAX) 0.25 MG tablet Take 1 tablet (0.25 mg total) by mouth 3 (three) times daily as needed for anxiety or sleep. 30 tablet 0  . atorvastatin (LIPITOR) 40 MG tablet Take 1 tablet (40 mg total) by mouth daily at 6 PM. 30 tablet 0  . benzonatate (TESSALON) 100 MG capsule Take 100 mg by mouth every 8 (eight) hours as needed for cough or congestion.    . citalopram (CELEXA) 40 MG tablet Take 0.5 tablets (20 mg total) by mouth daily.    . [facility] MELATONIN 5 MG TABS Take 5 mg by mouth daily as needed. For sleep    . doxycycline (VIBRAMYCIN) 100 MG capsule Take 1 capsule (100 mg total) by mouth 2 (two) times daily for 7 days. 14 capsule 0  . fluticasone (FLONASE) 50  MCG/ACT nasal spray Place 2 sprays into both nostrils daily. (Patient taking differently: Place 2 sprays into both nostrils daily as needed for allergies or rhinitis. ) 16 g 0  . gabapentin (NEURONTIN) 800 MG tablet Take 400 mg by mouth 4 (four) times daily.    . insulin aspart (NOVOLOG) 100 UNIT/ML injection Inject 25 Units into the skin 3 (three) times daily with meals. 10 mL 0  . insulin glargine (LANTUS) 100 UNIT/ML  injection Inject 0.35 mLs (35 Units total) into the skin 2 (two) times daily. 10 mL 0  . lisinopril (ZESTRIL) 2.5 MG tablet Take 1 tablet (2.5 mg total) by mouth daily. 30 tablet 0  . pantoprazole (PROTONIX) 40 MG tablet Take 1 tablet (40 mg total) by mouth 2 (two) times daily. 60 tablet 0  . polyethylene glycol (MIRALAX / GLYCOLAX) 17 g packet Take 17 g by mouth daily as needed for mild constipation. 30 each 0  . predniSONE (DELTASONE) 20 MG tablet Take 3 tablets (60 mg total) by mouth daily for 4 days. 12 tablet 0  . propranolol (INDERAL) 10 MG tablet Take 10 mg by mouth daily.    Marland Kitchen tiotropium (SPIRIVA) 18 MCG inhalation capsule Place 18 mcg into inhaler and inhale daily.    Marland Kitchen torsemide (DEMADEX) 20 MG tablet Take 2 tablets (40 mg total) by mouth 2 (two) times daily. 60 tablet 0    Musculoskeletal: Strength & Muscle Tone: decreased Gait & Station: unsteady Patient leans: Front  Psychiatric Specialty Exam: Physical Exam   Review of Systems  Constitutional: Negative for fever.  HENT: Negative for hearing loss.   Eyes: Negative for blurred vision.  Cardiovascular: Negative for chest pain.  Skin: Negative for rash.  Psychiatric/Behavioral: Negative for depression, hallucinations, memory loss, substance abuse and suicidal ideas. The patient is not nervous/anxious and does not have insomnia.     Blood pressure 109/85, pulse (!) 101, temperature 98.3 F (36.8 C), temperature source Oral, resp. rate 20, height '6\' 4"'$  (1.93 m), weight 126.1 kg, SpO2 94 %.Body mass index is 33.84 kg/m.  General Appearance: Fairly Groomed  Eye Contact:  Good  Speech:  Clear and Coherent  Volume:  Normal  Mood:  Euthymic  Affect:  Congruent  Thought Process:  Coherent, Linear and Descriptions of Associations: Circumstantial  Orientation:  Full (Time, Place, and Person)  Thought Content:  WDL and Tangential  Suicidal Thoughts:  Denies  Homicidal Thoughts:  Denies  Memory:  Immediate;   Fair Recent;    Fair  Judgement:  Poor  Insight:  Present and Shallow  Psychomotor Activity:  Normal  Concentration:  Concentration: Fair and Attention Span: Fair  Recall:  [employer] of Knowledge:  Fair  Language:  Fair  Akathisia:  Negative  Handed:  Right  AIMS (if indicated):     Assets:  Communication Skills Desire for Improvement Financial Resources/Insurance Housing Leisure Time Resilience Social Support  ADL's:  Intact  Cognition:  WNL  Sleep:       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 At this time patient endorses impulsive suicide attempt, however due to the number of medications and the lethality, in addition to increased risk factors for suicide completion (age, race, marital problems, chronic comorbidites) will continue to seek placement. Will continue current medications at this time.      Suella Broad, FNP 09/15/2019 12:41 PM   Patient seen face-to-face for psychiatric evaluation, chart reviewed and case discussed with the physician extender and developed treatment plan.  Reviewed the information documented and agree with the treatment plan. Corena Pilgrim, MD

## 2019-09-16 ENCOUNTER — Encounter (HOSPITAL_COMMUNITY): Payer: Self-pay | Admitting: Registered Nurse

## 2019-09-16 DIAGNOSIS — F332 Major depressive disorder, recurrent severe without psychotic features: Secondary | ICD-10-CM

## 2019-09-16 LAB — CBG MONITORING, ED: Glucose-Capillary: 181 mg/dL — ABNORMAL HIGH (ref 70–99)
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
## 2019-09-16 NOTE — Discharge Instructions (Signed)
For your behavioral health needs, you are advised to follow up with an outpatient therapist.  You are scheduled for an intake appointment with Shade Flood, LCSW at the [facility].  Your appointment is for tomorrow, September 17, 2019 at 10:00 am.  Due to Covid-19, these services are currently being handled virtually.  If you have any questions, please call the number listed below:       Shade Flood, [facility]. [address] [place], [place] [address]      [phone]

## 2019-09-16 NOTE — BH Assessment (Signed)
[facility] Assessment Progress Note  Per Shuvon Rankin, FNP, this pt does not require psychiatric hospitalization at this time.  Pt is to be discharged with recommendation to follow up with an outpatient therapist.  This writer spoke to pt in person.  He would prefer to have an intake appointment scheduled for him.  At 14:32 I called the [facility] [place] and spoke to [place].  Pt has been scheduled for an intake appointment with Shade Flood, LCSW, tomorrow, 09/17/2019 at 10:00 am.  This has been included in pt's discharge instructions.  Pt's nurse, Cel[place], has been notified.  Jalene Mullet, [place] Triage Specialist [phone]

## 2019-09-17 ENCOUNTER — Telehealth (HOSPITAL_COMMUNITY): Payer: Self-pay | Admitting: Licensed Clinical Social Worker

## 2019-09-17 ENCOUNTER — Ambulatory Visit (HOSPITAL_COMMUNITY): Payer: Medicare HMO | Admitting: Licensed Clinical Social Worker

## 2019-09-17 ENCOUNTER — Other Ambulatory Visit: Payer: Self-pay

## 2019-09-18 NOTE — Telephone Encounter (Signed)
Clinician outreached client for [facility] phone assessment today.Client did not pick up calls to home or mobile numbers and voicemails could not be left at this time due to both mail boxes being full. A Webex invite for virtual session went unanswered by clientas well.   [address], [place], LCASA 09/17/19

## 2019-09-19 ENCOUNTER — Encounter: Payer: Self-pay | Admitting: Nurse Practitioner

## 2019-09-19 ENCOUNTER — Ambulatory Visit: Payer: Medicare HMO | Admitting: Nurse Practitioner
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
## 2019-09-20 ENCOUNTER — Other Ambulatory Visit: Payer: Self-pay

## 2019-09-20 ENCOUNTER — Telehealth: Payer: Self-pay | Admitting: Family

## 2019-09-20 ENCOUNTER — Encounter (HOSPITAL_COMMUNITY): Payer: Self-pay | Admitting: Emergency Medicine

## 2019-09-20 ENCOUNTER — Inpatient Hospital Stay (HOSPITAL_COMMUNITY)
Admission: EM | Admit: 2019-09-20 | Discharge: 2019-09-29 | DRG: 917 | Disposition: A | Discharge status: Home / Self Care | Payer: Medicare HMO | Attending: Internal Medicine | Admitting: Internal Medicine

## 2019-09-20 DIAGNOSIS — Z79899 Other long term (current) drug therapy: Secondary | ICD-10-CM

## 2019-09-20 DIAGNOSIS — Z8249 Family history of ischemic heart disease and other diseases of the circulatory system: Secondary | ICD-10-CM

## 2019-09-20 DIAGNOSIS — E872 Acidosis: Secondary | ICD-10-CM | POA: Diagnosis present

## 2019-09-20 DIAGNOSIS — R0689 Other abnormalities of breathing: Secondary | ICD-10-CM

## 2019-09-20 DIAGNOSIS — Z794 Long term (current) use of insulin: Secondary | ICD-10-CM

## 2019-09-20 DIAGNOSIS — Z885 Allergy status to narcotic agent status: Secondary | ICD-10-CM

## 2019-09-20 DIAGNOSIS — Z20828 Contact with and (suspected) exposure to other viral communicable diseases: Secondary | ICD-10-CM | POA: Diagnosis present

## 2019-09-20 DIAGNOSIS — Z825 Family history of asthma and other chronic lower respiratory diseases: Secondary | ICD-10-CM

## 2019-09-20 DIAGNOSIS — M130 Polyarthritis, unspecified: Secondary | ICD-10-CM | POA: Diagnosis present

## 2019-09-20 DIAGNOSIS — T402X2A Poisoning by other opioids, intentional self-harm, initial encounter: Secondary | ICD-10-CM | POA: Diagnosis not present

## 2019-09-20 DIAGNOSIS — M25562 Pain in left knee: Secondary | ICD-10-CM | POA: Diagnosis present

## 2019-09-20 DIAGNOSIS — M351 Other overlap syndromes: Secondary | ICD-10-CM | POA: Diagnosis present

## 2019-09-20 DIAGNOSIS — J439 Emphysema, unspecified: Secondary | ICD-10-CM | POA: Diagnosis present

## 2019-09-20 DIAGNOSIS — I110 Hypertensive heart disease with heart failure: Secondary | ICD-10-CM | POA: Diagnosis present

## 2019-09-20 DIAGNOSIS — Z881 Allergy status to other antibiotic agents status: Secondary | ICD-10-CM

## 2019-09-20 DIAGNOSIS — Z8701 Personal history of pneumonia (recurrent): Secondary | ICD-10-CM

## 2019-09-20 DIAGNOSIS — Z9119 Patient's noncompliance with other medical treatment and regimen: Secondary | ICD-10-CM

## 2019-09-20 DIAGNOSIS — F3162 Bipolar disorder, current episode mixed, moderate: Secondary | ICD-10-CM | POA: Diagnosis present

## 2019-09-20 DIAGNOSIS — E669 Obesity, unspecified: Secondary | ICD-10-CM | POA: Diagnosis present

## 2019-09-20 DIAGNOSIS — Z9981 Dependence on supplemental oxygen: Secondary | ICD-10-CM

## 2019-09-20 DIAGNOSIS — G4733 Obstructive sleep apnea (adult) (pediatric): Secondary | ICD-10-CM | POA: Diagnosis present

## 2019-09-20 DIAGNOSIS — E785 Hyperlipidemia, unspecified: Secondary | ICD-10-CM | POA: Diagnosis present

## 2019-09-20 DIAGNOSIS — E11649 Type 2 diabetes mellitus with hypoglycemia without coma: Secondary | ICD-10-CM | POA: Diagnosis present

## 2019-09-20 DIAGNOSIS — K219 Gastro-esophageal reflux disease without esophagitis: Secondary | ICD-10-CM | POA: Diagnosis present

## 2019-09-20 DIAGNOSIS — K5900 Constipation, unspecified: Secondary | ICD-10-CM | POA: Diagnosis present

## 2019-09-20 DIAGNOSIS — Z8679 Personal history of other diseases of the circulatory system: Secondary | ICD-10-CM

## 2019-09-20 DIAGNOSIS — Z915 Personal history of self-harm: Secondary | ICD-10-CM

## 2019-09-20 DIAGNOSIS — Z8711 Personal history of peptic ulcer disease: Secondary | ICD-10-CM

## 2019-09-20 DIAGNOSIS — Z880 Allergy status to penicillin: Secondary | ICD-10-CM

## 2019-09-20 DIAGNOSIS — R4182 Altered mental status, unspecified: Secondary | ICD-10-CM

## 2019-09-20 DIAGNOSIS — Z888 Allergy status to other drugs, medicaments and biological substances status: Secondary | ICD-10-CM

## 2019-09-20 DIAGNOSIS — Z833 Family history of diabetes mellitus: Secondary | ICD-10-CM

## 2019-09-20 DIAGNOSIS — J9621 Acute and chronic respiratory failure with hypoxia: Secondary | ICD-10-CM | POA: Diagnosis present

## 2019-09-20 DIAGNOSIS — I2510 Atherosclerotic heart disease of native coronary artery without angina pectoris: Secondary | ICD-10-CM | POA: Diagnosis present

## 2019-09-20 DIAGNOSIS — Z87891 Personal history of nicotine dependence: Secondary | ICD-10-CM

## 2019-09-20 DIAGNOSIS — I252 Old myocardial infarction: Secondary | ICD-10-CM

## 2019-09-20 DIAGNOSIS — Z951 Presence of aortocoronary bypass graft: Secondary | ICD-10-CM

## 2019-09-20 DIAGNOSIS — Z6833 Body mass index (BMI) 33.0-33.9, adult: Secondary | ICD-10-CM

## 2019-09-20 DIAGNOSIS — T50901A Poisoning by unspecified drugs, medicaments and biological substances, accidental (unintentional), initial encounter: Secondary | ICD-10-CM | POA: Diagnosis present

## 2019-09-20 DIAGNOSIS — I5032 Chronic diastolic (congestive) heart failure: Secondary | ICD-10-CM | POA: Diagnosis present

## 2019-09-20 LAB — CBC
HCT: 35.4 % — ABNORMAL LOW (ref 39.0–52.0)
Hemoglobin: 10.3 g/dL — ABNORMAL LOW (ref 13.0–17.0)
MCH: 27.5 pg (ref 26.0–34.0)
MCHC: 29.1 g/dL — ABNORMAL LOW (ref 30.0–36.0)
MCV: 94.7 fL (ref 80.0–100.0)
Platelets: 182 10*3/uL (ref 150–400)
RBC: 3.74 MIL/uL — ABNORMAL LOW (ref 4.22–5.81)
RDW: 16 % — ABNORMAL HIGH (ref 11.5–15.5)
WBC: 12.4 10*3/uL — ABNORMAL HIGH (ref 4.0–10.5)
nRBC: 0 % (ref 0.0–0.2)

## 2019-09-20 LAB — CBG MONITORING, ED: Glucose-Capillary: 179 mg/dL — ABNORMAL HIGH (ref 70–99)

## 2019-09-20 MED ORDER — AMMONIA AROMATIC IN INHA
RESPIRATORY_TRACT | Status: AC
  Administered 2019-09-20: 3 mL
  Filled 2019-09-20: qty 10

## 2019-09-20 MED ORDER — SODIUM CHLORIDE 0.9% FLUSH: 3.0000 mL | Freq: Once | INTRAVENOUS | Status: DC

## 2019-09-20 MED ORDER — NALOXONE HCL 2 MG/2ML IJ SOSY
PREFILLED_SYRINGE | INTRAMUSCULAR | Status: AC
  Administered 2019-09-20: 2 mg
  Filled 2019-09-20: qty 2

## 2019-09-20 NOTE — ED Triage Notes (Signed)
Patient came by [facility] from home. Patient called EMS for weakness. Patient sugar was 54. Patient does not keep track of sugars or insulin. Patient bs- 220. While en route patient was harder to arouse and became lethargic.

## 2019-09-20 NOTE — Telephone Encounter (Signed)
Called patient to confirm his initial HF Clinic appointment at [facility] on 09/23/2019. Patient expresses frustration regarding his providers and appointments and that he'll go "wherever the people are nicest at". He says that he's closer to [place] then to [facility].   He says that he was on the phone with someone and was expecting an "important call" so he hung up on them to answer my call and expresses displeasure that I wasn't who he thought I would be. He does mention that he's recently "gotten off suicide precautions" and doesn't express current suicidal thoughts. Says that he was frustrated with "how my life was looking" and that's how he ended up in the ED.   After listening to patient and asking if he had other appointments, I asked him if he would like to keep his appointment with [place] or R/S at a later date. He adamantly said "No I'm not keeping this appointment, good-bye" and hung up the phone.  Attempted to speak to his wife regarding the cancelled appointment to let her know that we could reschedule it if he would like but had to leave a voicemail.

## 2019-09-20 NOTE — Progress Notes (Signed)
ABG drawn at 2335.  Sent to lab and lab notified at 2348

## 2019-09-21 ENCOUNTER — Other Ambulatory Visit: Payer: Self-pay

## 2019-09-21 ENCOUNTER — Encounter (HOSPITAL_COMMUNITY): Payer: Self-pay | Admitting: Emergency Medicine

## 2019-09-21 ENCOUNTER — Emergency Department (HOSPITAL_COMMUNITY): Payer: Medicare HMO

## 2019-09-21 DIAGNOSIS — J9622 Acute and chronic respiratory failure with hypercapnia: Secondary | ICD-10-CM

## 2019-09-21 DIAGNOSIS — I2510 Atherosclerotic heart disease of native coronary artery without angina pectoris: Secondary | ICD-10-CM | POA: Diagnosis present

## 2019-09-21 DIAGNOSIS — E872 Acidosis: Secondary | ICD-10-CM | POA: Diagnosis present

## 2019-09-21 DIAGNOSIS — T1491XA Suicide attempt, initial encounter: Secondary | ICD-10-CM

## 2019-09-21 DIAGNOSIS — I110 Hypertensive heart disease with heart failure: Secondary | ICD-10-CM | POA: Diagnosis present

## 2019-09-21 DIAGNOSIS — Z20828 Contact with and (suspected) exposure to other viral communicable diseases: Secondary | ICD-10-CM | POA: Diagnosis present

## 2019-09-21 DIAGNOSIS — J9690 Respiratory failure, unspecified, unspecified whether with hypoxia or hypercapnia: Secondary | ICD-10-CM | POA: Diagnosis not present

## 2019-09-21 DIAGNOSIS — T402X2A Poisoning by other opioids, intentional self-harm, initial encounter: Secondary | ICD-10-CM | POA: Diagnosis present

## 2019-09-21 DIAGNOSIS — T50901A Poisoning by unspecified drugs, medicaments and biological substances, accidental (unintentional), initial encounter: Secondary | ICD-10-CM | POA: Diagnosis present

## 2019-09-21 DIAGNOSIS — F3162 Bipolar disorder, current episode mixed, moderate: Secondary | ICD-10-CM

## 2019-09-21 DIAGNOSIS — E785 Hyperlipidemia, unspecified: Secondary | ICD-10-CM | POA: Diagnosis present

## 2019-09-21 DIAGNOSIS — K5900 Constipation, unspecified: Secondary | ICD-10-CM | POA: Diagnosis present

## 2019-09-21 DIAGNOSIS — Z630 Problems in relationship with spouse or partner: Secondary | ICD-10-CM | POA: Diagnosis not present

## 2019-09-21 DIAGNOSIS — Z888 Allergy status to other drugs, medicaments and biological substances status: Secondary | ICD-10-CM | POA: Diagnosis not present

## 2019-09-21 DIAGNOSIS — I5032 Chronic diastolic (congestive) heart failure: Secondary | ICD-10-CM | POA: Diagnosis present

## 2019-09-21 DIAGNOSIS — T402X2D Poisoning by other opioids, intentional self-harm, subsequent encounter: Secondary | ICD-10-CM | POA: Diagnosis not present

## 2019-09-21 DIAGNOSIS — T50902A Poisoning by unspecified drugs, medicaments and biological substances, intentional self-harm, initial encounter: Secondary | ICD-10-CM | POA: Diagnosis not present

## 2019-09-21 DIAGNOSIS — T1491XD Suicide attempt, subsequent encounter: Secondary | ICD-10-CM | POA: Diagnosis not present

## 2019-09-21 DIAGNOSIS — J9621 Acute and chronic respiratory failure with hypoxia: Secondary | ICD-10-CM | POA: Diagnosis not present

## 2019-09-21 DIAGNOSIS — Z9981 Dependence on supplemental oxygen: Secondary | ICD-10-CM | POA: Diagnosis not present

## 2019-09-21 DIAGNOSIS — T50902S Poisoning by unspecified drugs, medicaments and biological substances, intentional self-harm, sequela: Secondary | ICD-10-CM

## 2019-09-21 DIAGNOSIS — Z880 Allergy status to penicillin: Secondary | ICD-10-CM | POA: Diagnosis not present

## 2019-09-21 DIAGNOSIS — E669 Obesity, unspecified: Secondary | ICD-10-CM | POA: Diagnosis present

## 2019-09-21 DIAGNOSIS — Z885 Allergy status to narcotic agent status: Secondary | ICD-10-CM | POA: Diagnosis not present

## 2019-09-21 DIAGNOSIS — G4733 Obstructive sleep apnea (adult) (pediatric): Secondary | ICD-10-CM | POA: Diagnosis present

## 2019-09-21 DIAGNOSIS — Z881 Allergy status to other antibiotic agents status: Secondary | ICD-10-CM | POA: Diagnosis not present

## 2019-09-21 DIAGNOSIS — Z6833 Body mass index (BMI) 33.0-33.9, adult: Secondary | ICD-10-CM | POA: Diagnosis not present

## 2019-09-21 DIAGNOSIS — M25562 Pain in left knee: Secondary | ICD-10-CM | POA: Diagnosis present

## 2019-09-21 DIAGNOSIS — J439 Emphysema, unspecified: Secondary | ICD-10-CM | POA: Diagnosis present

## 2019-09-21 DIAGNOSIS — E11649 Type 2 diabetes mellitus with hypoglycemia without coma: Secondary | ICD-10-CM | POA: Diagnosis present

## 2019-09-21 DIAGNOSIS — M130 Polyarthritis, unspecified: Secondary | ICD-10-CM | POA: Diagnosis present

## 2019-09-21 DIAGNOSIS — K219 Gastro-esophageal reflux disease without esophagitis: Secondary | ICD-10-CM | POA: Diagnosis present

## 2019-09-21 DIAGNOSIS — M351 Other overlap syndromes: Secondary | ICD-10-CM | POA: Diagnosis present

## 2019-09-21 LAB — BLOOD GAS, ARTERIAL
Acid-Base Excess: 9.9 mmol/L — ABNORMAL HIGH (ref 0.0–2.0)
Bicarbonate: 37.7 mmol/L — ABNORMAL HIGH (ref 20.0–28.0)
Drawn by: 514251
O2 Saturation: 99.2 %
Patient temperature: 97.5
pCO2 arterial: 73.7 mmHg (ref 32.0–48.0)
pH, Arterial: 7.325 — ABNORMAL LOW (ref 7.350–7.450)
pO2, Arterial: 157 mmHg — ABNORMAL HIGH (ref 83.0–108.0)

## 2019-09-21 LAB — GLUCOSE, CAPILLARY
Glucose-Capillary: 150 mg/dL — ABNORMAL HIGH (ref 70–99)
Glucose-Capillary: 217 mg/dL — ABNORMAL HIGH (ref 70–99)
Glucose-Capillary: 161 mg/dL — ABNORMAL HIGH (ref 70–99)
Glucose-Capillary: 180 mg/dL — ABNORMAL HIGH (ref 70–99)

## 2019-09-21 LAB — COMPREHENSIVE METABOLIC PANEL
ALT: 16 U/L (ref 0–44)
AST: 14 U/L — ABNORMAL LOW (ref 15–41)
Albumin: 3.7 g/dL (ref 3.5–5.0)
Alkaline Phosphatase: 112 U/L (ref 38–126)
Anion gap: 13 (ref 5–15)
BUN: 42 mg/dL — ABNORMAL HIGH (ref 8–23)
CO2: 35 mmol/L — ABNORMAL HIGH (ref 22–32)
Calcium: 8.4 mg/dL — ABNORMAL LOW (ref 8.9–10.3)
Chloride: 88 mmol/L — ABNORMAL LOW (ref 98–111)
Creatinine, Ser: 1.31 mg/dL — ABNORMAL HIGH (ref 0.61–1.24)
GFR calc Af Amer: >60 mL/min (ref >60)
GFR calc non Af Amer: 52 mL/min — ABNORMAL LOW (ref >60)
Glucose, Bld: 175 mg/dL — ABNORMAL HIGH (ref 70–99)
Potassium: 4.1 mmol/L (ref 3.5–5.1)
Sodium: 136 mmol/L (ref 135–145)
Total Bilirubin: 0.6 mg/dL (ref 0.3–1.2)
Total Protein: 6.9 g/dL (ref 6.5–8.1)

## 2019-09-21 LAB — BASIC METABOLIC PANEL
Anion gap: 14 (ref 5–15)
BUN: 41 mg/dL — ABNORMAL HIGH (ref 8–23)
CO2: 34 mmol/L — ABNORMAL HIGH (ref 22–32)
Calcium: 8.3 mg/dL — ABNORMAL LOW (ref 8.9–10.3)
Chloride: 87 mmol/L — ABNORMAL LOW (ref 98–111)
Creatinine, Ser: 1.21 mg/dL (ref 0.61–1.24)
GFR calc Af Amer: >60 mL/min (ref >60)
GFR calc non Af Amer: 57 mL/min — ABNORMAL LOW (ref >60)
Glucose, Bld: 172 mg/dL — ABNORMAL HIGH (ref 70–99)
Potassium: 4.4 mmol/L (ref 3.5–5.1)
Sodium: 135 mmol/L (ref 135–145)

## 2019-09-21 LAB — BLOOD GAS, VENOUS
Acid-Base Excess: 12.8 mmol/L — ABNORMAL HIGH (ref 0.0–2.0)
Bicarbonate: 39.6 mmol/L — ABNORMAL HIGH (ref 20.0–28.0)
O2 Saturation: 57.3 %
Patient temperature: 98.6
pCO2, Ven: 66.5 mmHg — ABNORMAL HIGH (ref 44.0–60.0)
pH, Ven: 7.392 (ref 7.250–7.430)
pO2, Ven: 33 mmHg (ref 32.0–45.0)

## 2019-09-21 LAB — URINALYSIS, ROUTINE W REFLEX MICROSCOPIC
Bacteria, UA: NONE SEEN
Bilirubin Urine: NEGATIVE
Glucose, UA: NEGATIVE mg/dL
Ketones, ur: NEGATIVE mg/dL
Leukocytes,Ua: NEGATIVE
Nitrite: NEGATIVE
Protein, ur: NEGATIVE mg/dL
Specific Gravity, Urine: 1.01 (ref 1.005–1.030)
pH: 5 (ref 5.0–8.0)

## 2019-09-21 LAB — SALICYLATE LEVEL: Salicylate Lvl: <7 mg/dL (ref 2.8–30.0)

## 2019-09-21 LAB — RAPID URINE DRUG SCREEN, HOSP PERFORMED
Amphetamines: NOT DETECTED
Barbiturates: NOT DETECTED
Benzodiazepines: NOT DETECTED
Cocaine: NOT DETECTED
Opiates: POSITIVE — AB
Tetrahydrocannabinol: NOT DETECTED

## 2019-09-21 LAB — CBC
HCT: 34.8 % — ABNORMAL LOW (ref 39.0–52.0)
Hemoglobin: 10.1 g/dL — ABNORMAL LOW (ref 13.0–17.0)
MCH: 27.4 pg (ref 26.0–34.0)
MCHC: 29 g/dL — ABNORMAL LOW (ref 30.0–36.0)
MCV: 94.6 fL (ref 80.0–100.0)
Platelets: 178 10*3/uL (ref 150–400)
RBC: 3.68 MIL/uL — ABNORMAL LOW (ref 4.22–5.81)
RDW: 16.2 % — ABNORMAL HIGH (ref 11.5–15.5)
WBC: 9.7 10*3/uL (ref 4.0–10.5)
nRBC: 0 % (ref 0.0–0.2)

## 2019-09-21 LAB — ACETAMINOPHEN LEVEL
Acetaminophen (Tylenol), Serum: <10 ug/mL — ABNORMAL LOW (ref 10–30)
Acetaminophen (Tylenol), Serum: <10 ug/mL — ABNORMAL LOW (ref 10–30)

## 2019-09-21 LAB — PHOSPHORUS
Phosphorus: 2.9 mg/dL (ref 2.5–4.6)
Phosphorus: 2.9 mg/dL (ref 2.5–4.6)

## 2019-09-21 LAB — PROTIME-INR
INR: 0.9 (ref 0.8–1.2)
Prothrombin Time: 11.5 seconds (ref 11.4–15.2)

## 2019-09-21 LAB — CBG MONITORING, ED: Glucose-Capillary: 170 mg/dL — ABNORMAL HIGH (ref 70–99)

## 2019-09-21 LAB — MAGNESIUM: Magnesium: 2.1 mg/dL (ref 1.7–2.4)

## 2019-09-21 LAB — SARS CORONAVIRUS 2 (TAT 6-24 HRS): SARS Coronavirus 2: NEGATIVE

## 2019-09-21 LAB — LIPASE, BLOOD: Lipase: 33 U/L (ref 11–51)

## 2019-09-21 LAB — ETHANOL: Alcohol, Ethyl (B): <10 mg/dL (ref <10)

## 2019-09-21 LAB — MRSA PCR SCREENING: MRSA by PCR: NEGATIVE

## 2019-09-21 LAB — POC SARS CORONAVIRUS 2 AG -  ED: SARS Coronavirus 2 Ag: NEGATIVE

## 2019-09-21 MED ORDER — ORAL CARE MOUTH RINSE
15.0000 mL | Freq: Two times a day (BID) | OROMUCOSAL | Status: DC
  Administered 2019-09-21 – 2019-09-27 (×8): 15 mL via OROMUCOSAL

## 2019-09-21 MED ORDER — GABAPENTIN 400 MG PO CAPS
400.0000 mg | ORAL_CAPSULE | Freq: Two times a day (BID) | ORAL | Status: DC
  Administered 2019-09-21 – 2019-09-22 (×3): 400 mg via ORAL
  Filled 2019-09-21 (×3): qty 1

## 2019-09-21 MED ORDER — IPRATROPIUM-ALBUTEROL 0.5-2.5 (3) MG/3ML IN SOLN
3.0000 mL | Freq: Four times a day (QID) | RESPIRATORY_TRACT | Status: DC
  Administered 2019-09-21 – 2019-09-23 (×7): 3 mL via RESPIRATORY_TRACT
  Filled 2019-09-21 (×8): qty 3

## 2019-09-21 MED ORDER — NALOXONE HCL 4 MG/10ML IJ SOLN
1.0000 mg/h | INTRAVENOUS | Status: DC
  Filled 2019-09-21: qty 10

## 2019-09-21 MED ORDER — VANCOMYCIN HCL 10 G IV SOLR
2000.0000 mg | Freq: Once | INTRAVENOUS | Status: DC
  Filled 2019-09-21: qty 2000

## 2019-09-21 MED ORDER — IPRATROPIUM-ALBUTEROL 0.5-2.5 (3) MG/3ML IN SOLN: 3.0000 mL | RESPIRATORY_TRACT | Status: DC | PRN

## 2019-09-21 MED ORDER — ATORVASTATIN CALCIUM 40 MG PO TABS
40.0000 mg | ORAL_TABLET | Freq: Every day | ORAL | Status: DC
  Administered 2019-09-21 – 2019-09-28 (×8): 40 mg via ORAL
  Filled 2019-09-21 (×8): qty 1

## 2019-09-21 MED ORDER — PROPRANOLOL HCL 10 MG PO TABS
10.0000 mg | ORAL_TABLET | Freq: Every day | ORAL | Status: DC
  Administered 2019-09-21 – 2019-09-29 (×9): 10 mg via ORAL
  Filled 2019-09-21 (×10): qty 1

## 2019-09-21 MED ORDER — ENOXAPARIN SODIUM 60 MG/0.6ML ~~LOC~~ SOLN
60.0000 mg | SUBCUTANEOUS | Status: DC
  Administered 2019-09-21 – 2019-09-29 (×9): 60 mg via SUBCUTANEOUS
  Filled 2019-09-21 (×8): qty 0.6

## 2019-09-21 MED ORDER — INSULIN ASPART 100 UNIT/ML ~~LOC~~ SOLN
0.0000 [IU] | Freq: Three times a day (TID) | SUBCUTANEOUS | Status: DC
  Administered 2019-09-21: 16:00:00 3 [IU] via SUBCUTANEOUS
  Administered 2019-09-21: 08:00:00 2 [IU] via SUBCUTANEOUS
  Administered 2019-09-21: 12:00:00 5 [IU] via SUBCUTANEOUS
  Administered 2019-09-22: 2 [IU] via SUBCUTANEOUS
  Administered 2019-09-22: 13:00:00 3 [IU] via SUBCUTANEOUS
  Administered 2019-09-22: 2 [IU] via SUBCUTANEOUS
  Administered 2019-09-23 (×3): 3 [IU] via SUBCUTANEOUS
  Administered 2019-09-24 (×2): 2 [IU] via SUBCUTANEOUS
  Administered 2019-09-24: 18:00:00 3 [IU] via SUBCUTANEOUS
  Administered 2019-09-25: 12:00:00 2 [IU] via SUBCUTANEOUS
  Administered 2019-09-26: 17:00:00 3 [IU] via SUBCUTANEOUS
  Administered 2019-09-26: 2 [IU] via SUBCUTANEOUS
  Administered 2019-09-26 – 2019-09-27 (×2): 3 [IU] via SUBCUTANEOUS
  Administered 2019-09-27: 2 [IU] via SUBCUTANEOUS
  Administered 2019-09-27 – 2019-09-28 (×2): 3 [IU] via SUBCUTANEOUS
  Administered 2019-09-28: 09:00:00 2 [IU] via SUBCUTANEOUS
  Administered 2019-09-28 – 2019-09-29 (×2): 3 [IU] via SUBCUTANEOUS
  Administered 2019-09-29: 2 [IU] via SUBCUTANEOUS
  Filled 2019-09-21: qty 0.15

## 2019-09-21 MED ORDER — ALBUTEROL SULFATE (2.5 MG/3ML) 0.083% IN NEBU
5.0000 mg | INHALATION_SOLUTION | Freq: Once | RESPIRATORY_TRACT | Status: AC
  Administered 2019-09-21: 03:00:00 5 mg via RESPIRATORY_TRACT
  Filled 2019-09-21: qty 6

## 2019-09-21 MED ORDER — HYDRALAZINE HCL 20 MG/ML IJ SOLN: 5.0000 mg | Freq: Once | INTRAMUSCULAR | Status: DC

## 2019-09-21 MED ORDER — INSULIN GLARGINE 100 UNIT/ML ~~LOC~~ SOLN
30.0000 [IU] | Freq: Every day | SUBCUTANEOUS | Status: DC
  Administered 2019-09-21: 11:00:00 30 [IU] via SUBCUTANEOUS
  Filled 2019-09-21 (×3): qty 0.3

## 2019-09-21 MED ORDER — NALOXONE HCL 2 MG/2ML IJ SOSY
2.0000 mg | PREFILLED_SYRINGE | Freq: Once | INTRAMUSCULAR | Status: AC
  Administered 2019-09-21: 04:00:00 2 mg via INTRAVENOUS
  Filled 2019-09-21: qty 2

## 2019-09-21 MED ORDER — ALBUTEROL SULFATE (2.5 MG/3ML) 0.083% IN NEBU
2.5000 mg | INHALATION_SOLUTION | RESPIRATORY_TRACT | Status: DC | PRN
  Administered 2019-09-22: 23:00:00 2.5 mg via RESPIRATORY_TRACT
  Filled 2019-09-21: qty 3

## 2019-09-21 MED ORDER — TORSEMIDE 20 MG PO TABS
40.0000 mg | ORAL_TABLET | Freq: Every day | ORAL | Status: DC
  Administered 2019-09-21 – 2019-09-22 (×2): 40 mg via ORAL
  Filled 2019-09-21 (×4): qty 2

## 2019-09-21 MED ORDER — PANTOPRAZOLE SODIUM 40 MG PO TBEC
40.0000 mg | DELAYED_RELEASE_TABLET | Freq: Two times a day (BID) | ORAL | Status: DC
  Administered 2019-09-21 – 2019-09-29 (×17): 40 mg via ORAL
  Filled 2019-09-21 (×17): qty 1

## 2019-09-21 MED ORDER — UMECLIDINIUM BROMIDE 62.5 MCG/INH IN AEPB
1.0000 | INHALATION_SPRAY | Freq: Every day | RESPIRATORY_TRACT | Status: DC
  Administered 2019-09-23: 1 via RESPIRATORY_TRACT
  Filled 2019-09-21: qty 7

## 2019-09-21 MED ORDER — CHLORHEXIDINE GLUCONATE 0.12 % MT SOLN
15.0000 mL | Freq: Two times a day (BID) | OROMUCOSAL | Status: DC
  Administered 2019-09-21 – 2019-09-29 (×13): 15 mL via OROMUCOSAL
  Filled 2019-09-21 (×15): qty 15

## 2019-09-21 MED ORDER — SODIUM CHLORIDE 0.9 % IV SOLN
2.0000 g | Freq: Once | INTRAVENOUS | Status: DC
  Filled 2019-09-21: qty 2

## 2019-09-21 MED ORDER — CHLORHEXIDINE GLUCONATE CLOTH 2 % EX PADS
6.0000 | MEDICATED_PAD | Freq: Every day | CUTANEOUS | Status: DC
  Administered 2019-09-22 – 2019-09-27 (×2): 6 via TOPICAL

## 2019-09-21 MED ORDER — TIOTROPIUM BROMIDE MONOHYDRATE 18 MCG IN CAPS: 18.0000 ug | ORAL_CAPSULE | Freq: Every day | RESPIRATORY_TRACT | Status: DC

## 2019-09-21 MED ORDER — INSULIN ASPART 100 UNIT/ML ~~LOC~~ SOLN
0.0000 [IU] | Freq: Every day | SUBCUTANEOUS | Status: DC
  Filled 2019-09-21: qty 0.05

## 2019-09-21 MED ORDER — CITALOPRAM HYDROBROMIDE 20 MG PO TABS
20.0000 mg | ORAL_TABLET | Freq: Every day | ORAL | Status: DC
  Administered 2019-09-21 – 2019-09-29 (×9): 20 mg via ORAL
  Filled 2019-09-21 (×3): qty 1
  Filled 2019-09-21: qty 2
  Filled 2019-09-21 (×3): qty 1
  Filled 2019-09-21 (×2): qty 2

## 2019-09-21 MED ORDER — LISINOPRIL 5 MG PO TABS
2.5000 mg | ORAL_TABLET | Freq: Every day | ORAL | Status: DC
  Administered 2019-09-21 – 2019-09-28 (×8): 2.5 mg via ORAL
  Filled 2019-09-21 (×9): qty 1

## 2019-09-21 NOTE — ED Provider Notes (Addendum)
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 costophrenic angle, likely small effusion. Overlying median sternotomy wires. IMPRESSION: Pulmonary vascular congestion and hazy airspace opacity at the left lung base which could be layering effusion and/or early infectious etiology. This is not significantly changed since the prior exam. Trace left pleural effusion. Electronically Signed   By: Prudencio Pair M.D.   On: 09/05/2019 10:16    EKG EKG Interpretation  Date/Time:  Friday September 20 2019 23:11:38 EST Ventricular Rate:  85 PR Interval:    QRS Duration: 145 QT Interval:  422 QTC Calculation: 502 R Axis:   -65 Text Interpretation: Sinus rhythm RBBB and LAFB Confirmed by Randal Buba, Kenzleigh Sedam ([id number]) on 09/20/2019 11:53:00 PM   Radiology No results found.  Procedures Procedures (including critical care  time)  Medications Ordered in ED Medications  sodium chloride flush (NS) 0.9 % injection 3 mL (has no administration in time range)  naloxone [facility]) 2 MG/2ML injection (2 mg  Given 09/20/19 2342)  ammonia inhalant (3 mLs  Given 09/20/19 2342)    MDM Reviewed: previous chart, nursing note and vitals Reviewed previous: labs Interpretation: labs, ECG and x-ray Total time providing critical care: 75-105 minutes (bipap and narcan drip ). This excludes time spent performing separately reportable procedures and services. Consults: critical care  CRITICAL CARE Performed by: Ashawna Hanback K Adia Crammer-Rasch Total critical care time: 75 minutes Critical care time was exclusive of separately billable procedures and treating other patients. Critical care was necessary to treat or prevent imminent or life-threatening deterioration. Critical care was time spent personally by me on the following activities: development of treatment plan with patient and/or surrogate as well as nursing, discussions with consultants, evaluation of patient's response to treatment, examination of patient, obtaining history from patient or surrogate, ordering and performing treatments and interventions, ordering and review of laboratory studies, ordering and review of radiographic studies, pulse oximetry and re-evaluation of patient's condition.  Initial Impression / Assessment and Plan / ED Course  Started a narcan drip as he awoke with this earlier and I suspect a second OD vs. Misuse of his home medications.  Placed in negative pressure to put patient on BIPAP.    [name] was evaluated in Emergency Department on 09/21/2019 for the symptoms described in the history of present illness. He was evaluated in the context of the global COVID-19 pandemic, which necessitated consideration that the patient might be at risk for infection with the SARS-CoV-2 virus that causes COVID-19. Institutional protocols and algorithms that pertain  to the evaluation of patients at risk for COVID-19 are in a state of rapid change based on information released by regulatory bodies including the CDC and federal and state organizations. These policies and algorithms were followed during the patient's care in the ED.   Final Clinical Impressions(s) / ED Diagnoses   Admit to critical care.  Medicine has refused  Jahking Lesser, MD 09/21/19 0712    Veatrice Kells, MD 09/21/19 0236

## 2019-09-21 NOTE — Consult Note (Addendum)
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 Brod T, MD   20 mg at 09/21/19 1026  . enoxaparin (LOVENOX) injection 60 mg  60 mg Subcutaneous Q24H Daryll Brod T, MD   60 mg at 09/21/19 1029  . gabapentin (NEURONTIN) capsule 400 mg  400 mg Oral BID Daryll Brod T, MD   400 mg at 09/21/19 1026  . insulin aspart (novoLOG) injection 0-15  Units  0-15 Units Subcutaneous TID WC Jacalyn Lefevre, MD   3 Units at 09/21/19 1609  . insulin aspart (novoLOG) injection 0-5 Units  0-5 Units Subcutaneous QHS Lannan, Margo T, MD      . insulin glargine (LANTUS) injection 30 Units  30 Units Subcutaneous Daily Jacalyn Lefevre, MD   30 Units at 09/21/19 1058  . ipratropium-albuterol (DUONEB) 0.5-2.5 (3) MG/3ML nebulizer solution 3 mL  3 mL Nebulization QID Collene Gobble, MD   3 mL at  09/21/19 1550  . lisinopril (ZESTRIL) tablet 2.5 mg  2.5 mg Oral Daily Daryll Brod T, MD   2.5 mg at 09/21/19 1026  . MEDLINE mouth rinse  15 mL Mouth Rinse q12n4p Daryll Brod T, MD   15 mL at 09/21/19 1205  . pantoprazole (PROTONIX) EC tablet 40 mg  40 mg Oral BID Daryll Brod T, MD   40 mg at 09/21/19 1026  . propranolol (INDERAL) tablet 10 mg  10 mg Oral Daily Daryll Brod T, MD   10 mg at 09/21/19 1015  . sodium chloride flush (NS) 0.9 % injection 3 mL  3 mL Intravenous Once Lannan, Margo T, MD      . torsemide (DEMADEX) tablet 40 mg  40 mg Oral Daily Daryll Brod T, MD   40 mg at 09/21/19 1058  . [START ON 09/22/2019] umeclidinium bromide (INCRUSE ELLIPTA) 62.5 MCG/INH 1 puff  1 puff Inhalation Daily Byrum, Rose Fillers, MD        Musculoskeletal: Strength & Muscle Tone: not tested Gait & Station: not tested Patient leans: N/A  Psychiatric Specialty Exam: Physical Exam  Psychiatric: His speech is normal and behavior is normal. Cognition and memory are normal. He expresses impulsivity. He exhibits a depressed mood. He expresses suicidal ideation. He expresses suicidal plans.    Review of Systems  Psychiatric/Behavioral: Positive for depression and suicidal ideas.    Blood pressure (!) 120/57, pulse 93, temperature 97.8 F (36.6 C), temperature source Oral, resp. rate 20, height 6' 4"  (1.93 m), weight 125.5 kg, SpO2 97 %.Body mass index is 33.68 kg/m.  General Appearance: Casual  Eye Contact:  Good  Speech:  Clear and Coherent  Volume:  Decreased   Mood:  Depressed  Affect:  Constricted  Thought Process:  Coherent and Linear  Orientation:  Full (Time, Place, and Person)  Thought Content:  Logical  Suicidal Thoughts:  Yes.  with intent/plan  Homicidal Thoughts:  No  Memory:  Immediate;   Good Recent;   Fair Remote;   Good  Judgement:  Poor  Insight:  Shallow  Psychomotor Activity:  Psychomotor Retardation  Concentration:  Concentration: Fair and Attention Span: Fair  Recall:  Good  Fund of Knowledge:  Good  Language:  Good  Akathisia:  No  Handed:  Right  AIMS (if indicated):     Assets:  Communication Skills Desire for Improvement  ADL's:  marginal  Cognition:  WNL  Sleep:   fair     Treatment Plan Summary: 78 year old male with history of Bipolar disorder-mixed who was admitted to ICU after he overdosed on Hydrocodone. He has overdosed on his medications twice in 8 days. He is endorsing worsening depression and unable to contract for safety. Patient will benefit from inpatient Geriatric psychiatric admission for stabilization after he is deemed medically stable.  Recommendations: -Continue 1:1 sitter for safety -Continues Celexa 20 mg daily for depression. -Continue Gabapentin 400 mg twice daily for mood -Consider Social worker consult to facilitate inpatient Geriatric psychiatric admission.  Disposition: Recommend psychiatric Inpatient admission when medically cleared. Supportive therapy provided about ongoing stressors. Psychiatric service siging out. Re-consult as needed  This service was provided via telemedicine using a 2-way, interactive audio and video technology.  Names of all persons participating in this telemedicine service and their role in this encounter. Name: [name] Role: Patient  Name: Avon Gully Role: RN  Name: Lorel Monaco Role: Psychiatrist  Name:  Role:     Corena Pilgrim, MD 09/21/2019 5:10 PM

## 2019-09-21 NOTE — Progress Notes (Signed)
Patient took off his bipap at 2200 because he was complaining of the pressure being too high. RT placed patient back on at 2255. Patient is tolerating well.

## 2019-09-21 NOTE — ED Notes (Signed)
CRITICAL VALUE STICKER  CRITICAL VALUE: pCO2 arterial 73.7  RECEIVER (on-site recipient of call): [employer] RN  DATE & TIME NOTIFIED: 0000  MESSENGER (representative from lab): Rachelle  MD NOTIFIED: Dr. Randal Buba  TIME OF NOTIFICATION: 0005  RESPONSE: Waiting on orders

## 2019-09-21 NOTE — Progress Notes (Signed)
Lovenox per Pharmacy for DVT Prophylaxis    Pharmacy has been consulted from dosing enoxaparin (lovenox) in this patient for DVT prophylaxis.  The pharmacist has reviewed pertinent labs (Hgb _10.1__; PLT__178_), patient weight (126___kg) and renal function (CrCl_74__mL/min) and decided that enoxaparin _60_mg SQ Q24Hrs is appropriate for this patient.  The pharmacy department will sign off at this time.  Please reconsult pharmacy if status changes or for further issues.  Thank you  Cyndia Diver PharmD, BCPS  09/21/2019, 6:13 AM

## 2019-09-21 NOTE — Progress Notes (Signed)
Upon removal of BIPAP with Dr. Lamonte Sakai at bedside pt states, "I thought that it would be better for my wife if I were dead.". 1:1 sitter placed at bedside, SI precautions initiated. Dr. Lamonte Sakai to consult Psychiatry for additional assessment.

## 2019-09-21 NOTE — Progress Notes (Signed)
NAME:  [name], MRN:  [id number], DOB:  [birth year], LOS: 0 ADMISSION DATE:  09/20/2019, CONSULTATION DATE:  09/21/2019 REFERRING MD:  [facility], CHIEF COMPLAINT:  Overdose, respiratory failure   Brief History   78 yo M with a history of COPD on 4L [place], diastolic heart failure, DM2, morbid obesity and psych disorder (bipolar disorder) who presents with lethargy, respiratory failure after opiate overdose.   History of present illness   History is taken mostly from patient and is limited.  Patient evidently called EMS due to feeling weak, and evidently was told that his blood sugar was low in the 50s when EMS arrived.  He also told me (though he cannot remember exactly when it was), that he took 12 pills of hydrocodone- said that he did not know how he got them.  When he arrived in the ED, blood sugar was normal, and patient did get suddenly lethargic when he was being evaluated.  ABG showed respiratory acidosis with 7/32/74, and patient was given narcan and placed on BIPAP.  He had improvement in his mental status and was speaking in full sentences when I saw him.  He could not give me much of his medical history, mainly because his wife takes care of his medications.   Notably, he was recently admitted for an overdose and was discharged from the ED after being seen by psychiatry- they did not feel it was an intentional overdose.    Otherwise, he denies any fevers or chills.  Does have some nonspecific abdominal pain worse in LLQ and epigastric region.    Past Medical History   Past Medical History:  Diagnosis Date  . AAA (abdominal aortic aneurysm) ([place])    a. 12/2008 s/p 7cm, endovascular repair with coiling right hypogastric artery   . Acute [place] ulcer   . Adenomatous duodenal polyp   . Allergic rhinitis, cause unspecified   . Anxiety   . AVM (arteriovenous malformation) of colon with hemorrhage   . Bipolar 1 disorder, mixed, moderate ([place]) 04/16/2015  . CAD (coronary artery  disease)    a. 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  . Chronic diastolic CHF (congestive heart failure) ([place])    a. 04/2015 Echo: EF 55-60%, no rwma, Gr 1 DD, mild AI; b. 04/2019 Echo: EF 55-60%. Mod diast dysfxn. Mild AS/AI.   Marland Kitchen Complication of anesthesia    "if they sedate me for too long, they have to intubate me; then they can't get me to come out of it" (04/03/2017)  . COPD (chronic obstructive pulmonary disease) ([place])    a. GOLD stage IV, started home O2. Severe bullous disease of LUL. Prolonged intubation after surgeries due to COPD.  Marland Kitchen Depression with anxiety 01/14/2013  . Diabetes mellitus with complication ([place])   . Diverticulosis   . Duodenal diverticulum   . Duodenal ulcer   . Emphysema of lung ([place])   . Esophagitis   . Essential hypertension 08/18/2009   Qualifier: Diagnosis of  By: Doy Mince LPN, Megan    . GERD (gastroesophageal reflux disease)   . GI bleed requiring more than 4 units of blood in 24 hours, ICU, or surgery    a. Hx bleeding gastric polyps, cecal & sigmoid AVMS s/p APC 03/30/14  . Hiatal hernia    large  . History of blood transfusion    "many many many; related to blood loss; anemia"  . Hyperlipidemia   . Insomnia 08/10/2014  . Leucocytosis 12/04/2013  .  Major depressive disorder   . Memory loss   . Morbid obesity ([place])   . Multiple gastric polyps   . Myocardial infarction ([place])    "I think I had a minor one when I had the OHS"  . On home oxygen therapy    "7 liters [place] w/oxigenator" (04/03/2017)  . Pneumonia 2017  . Recurrent Microcytic Anemia    a. presumed chronic GI blood loss.  . Type II diabetes mellitus ([place])   . Vitamin D deficiency 08/10/2014     Significant Hospital Events     Consults:    Procedures:    Significant Diagnostic Tests:  ABG 7.32/74  Micro Data:  Blood cultures x2 09/21/2019  Antimicrobials:  Holding antibiotics   Interim history/subjective:  Awake this morning, tolerating BiPAP, no  respiratory distress  Objective   Blood pressure 123/67, pulse 100, temperature 98.7 F (37.1 C), temperature source Axillary, resp. rate (!) 21, height 6\' 4"  (1.93 m), weight 125.5 kg, SpO2 94 %.    Vent Mode: BIPAP;PCV FiO2 (%):  [30 %-32 %] 30 % Set Rate:  [12 bmp] 12 bmp PEEP:  [5 cmH20] 5 cmH20   Intake/Output Summary (Last 24 hours) at 09/21/2019 0930 Last data filed at 09/21/2019 0800 Gross per 24 hour  Intake -  Output 510 ml  Net -510 ml   Filed Weights   09/21/19 0113 09/21/19 0620  Weight: 126.1 kg 125.5 kg    Examination: General: Obese man, on BiPAP but ready to come off, no respiratory distress HENT: Large neck, crowded oropharynx, pupils reactive Lungs: Decreased bilateral breath sounds especially at bases, no crackles, no wheezes Cardiovascular: Regular, tachycardic 110, no murmur Abdomen: Soft, mildly distended, no rebound or guarding, positive bowel sounds Extremities: Scattered bruising Neuro: Awake, interacting, oriented, some difficulty recalling details of what medications he took but he clearly notes that he intended to hurt himself GU: foley in place  Resolved Hospital Problem list     Assessment & Plan:  78 yo M with a history of COPD (on 4L O2), OSA non-compliant with bipap, diastolic heart failure, bipolar disorder who presented after apparent opiate overdose (~12 pills of hydrocodone) with mild respiratory acidosis, given narcan and started on bipap.   # Acute overdose: UDS + for opiates, likely hydrocodone.  He acknowledges that he was depressed and trying to hurt himself.  Mental status now improved -Narcan drip deferred -Should be able to come off BiPAP at this time, follow mental status and PCO2 -Home gabapentin as ordered -Hold all sedating medications -Continue Celexa -Acetaminophen negative x2 -He will need to be seen for psychiatric consultation.  Denies suicidal ideation currently  # Acute on chronic respiratory failure, narcotics  superimposed on COPD # COPD / OSA overlap syndrome, end stage, nocturnal BiPAP at home -Should be able to come off BiPAP currently, follow mental status -Check ABG if any change in wakefulness, mental status -Scheduled DuoNeb, albuterol nebs as needed for now, transition back to home BD regimen over next 24 hours -No evidence for acute exacerbation, defer steroids, antibiotics at this time  # Diastolic heart failure:  Currently not in acute exacerbation -Continue home lisinopril, half dose torsemide (usually on twice daily), home propranolol.  Uptitrate the torsemide as his p.o. intake improves  # DM: Home regimen lantus 35 BID with meal time lispro -Continue lantus 30 Qday + SSI for now -Should be okay to start carb modified, heart healthy diet   Best practice:  Diet: Okay to start heart healthy  carb modified diet Pain/Anxiety/Delirium protocol (if indicated): N/A VAP protocol (if indicated): n/a DVT prophylaxis: lovenox GI prophylaxis: n/a Glucose control: Lantus, sliding scale insulin Mobility: Out of bed Code Status: Notes indicate DNR back in July, will clarify Family Communication:  Disposition:   Labs   CBC: Recent Labs  Lab 09/15/19 1140 09/20/19 2314 09/21/19 0513  WBC 15.2* 12.4* 9.7  HGB 10.7* 10.3* 10.1*  HCT 37.0* 35.4* 34.8*  MCV 91.6 94.7 94.6  PLT 301 182 0000000    Basic Metabolic Panel: Recent Labs  Lab 09/15/19 1140 09/20/19 2314 09/21/19 0513  NA 134* 136 135  K 3.9 4.1 4.4  CL 86* 88* 87*  CO2 34* 35* 34*  GLUCOSE 96 175* 172*  BUN 39* 42* 41*  CREATININE 1.45* 1.31* 1.21  CALCIUM 8.6* 8.4* 8.3*  MG  --   --  2.1  PHOS  --   --  2.9  2.9   GFR: Estimated Creatinine Clearance: 74 mL/min (by C-G formula based on SCr of 1.21 mg/dL). Recent Labs  Lab 09/15/19 1140 09/20/19 2314 09/21/19 0513  WBC 15.2* 12.4* 9.7    Liver Function Tests: Recent Labs  Lab 09/15/19 1140 09/20/19 2314  AST 15 14*  ALT 17 16  ALKPHOS 102 112   BILITOT 0.9 0.6  PROT 7.1 6.9  ALBUMIN 3.6 3.7   Recent Labs  Lab 09/21/19 0513  LIPASE 33   No results for input(s): AMMONIA in the last 168 hours.  ABG    Component Value Date/Time   PHART 7.325 (L) 09/20/2019 2335   PCO2ART 73.7 (HH) 09/20/2019 2335   PO2ART 157 (H) 09/20/2019 2335   HCO3 39.6 (H) 09/21/2019 0647   TCO2 33 (H) 10/12/2018 0027   ACIDBASEDEF 2.0 12/23/2008 0331   O2SAT 57.3 09/21/2019 0647     Coagulation Profile: Recent Labs  Lab 09/21/19 0513  INR 0.9    Cardiac Enzymes: No results for input(s): CKTOTAL, CKMB, CKMBINDEX, TROPONINI in the last 168 hours.  HbA1C: Hgb A1c MFr Bld  Date/Time Value Ref Range Status  09/15/2019 11:40 AM 6.6 (H) 4.8 - 5.6 % Final    Comment:    (NOTE) Pre diabetes:          5.7%-6.4% Diabetes:              >6.4% Glycemic control for   <7.0% adults with diabetes   08/04/2019 04:33 AM 6.8 (H) 4.8 - 5.6 % Final    Comment:    (NOTE)         Prediabetes: 5.7 - 6.4         Diabetes: >6.4         Glycemic control for adults with diabetes: <7.0     CBG: Recent Labs  Lab 09/15/19 2208 09/16/19 0806 09/20/19 2309 09/21/19 0601 09/21/19 0812  GLUCAP 163* 181* 179* 170* 150*     Critical care time: 35     Baltazar Apo, MD, PhD 09/21/2019, 9:43 AM [facility] [phone] or if no answer [phone]

## 2019-09-21 NOTE — ED Notes (Signed)
Tried to in and out cath patient and unsuccessful x 2.

## 2019-09-21 NOTE — ED Notes (Signed)
Collection of blue top bld culture only- rt hand. RN advised. [employer]

## 2019-09-21 NOTE — Progress Notes (Signed)
eLink Physician-Brief Progress Note Patient Name: [name] DOB: 11-10-1940 MRN: [id number]   Date of Service  09/21/2019  HPI/Events of Note  18 M morbidly obese, bipolar disorder, recently overdosed on Xanax now positive for opiates. He presented with generalized weakness and altered sensorium.   eICU Interventions   Placed on BiPap for acute on chronic hypercapneic respiratory failure  Placed on Narcan for suspected opiate overdose     Intervention Category Major Interventions: Respiratory failure - evaluation and management;Delirium, psychosis, severe agitation - evaluation and management;Hypercarbia - evaluation and management Evaluation Type: New Patient Evaluation  Judd Lien 09/21/2019, 7:13 AM
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
## 2019-09-21 NOTE — Progress Notes (Signed)
A consult was received from an ED physician for vancomycin per pharmacy dosing.  The patient's profile has been reviewed for ht/wt/allergies/indication/available labs.   A one time order has been placed for vancomycin 2gm iv x1.  Further antibiotics/pharmacy consults should be ordered by admitting physician if indicated.                       Thank you, Nani Skillern Crowford 09/21/2019  2:22 AM

## 2019-09-22 DIAGNOSIS — T50902S Poisoning by unspecified drugs, medicaments and biological substances, intentional self-harm, sequela: Secondary | ICD-10-CM | POA: Diagnosis not present

## 2019-09-22 DIAGNOSIS — F3162 Bipolar disorder, current episode mixed, moderate: Secondary | ICD-10-CM | POA: Diagnosis not present

## 2019-09-22 DIAGNOSIS — J9622 Acute and chronic respiratory failure with hypercapnia: Secondary | ICD-10-CM | POA: Diagnosis not present

## 2019-09-22 DIAGNOSIS — J9621 Acute and chronic respiratory failure with hypoxia: Secondary | ICD-10-CM | POA: Diagnosis not present

## 2019-09-22 LAB — BASIC METABOLIC PANEL
Anion gap: 11 (ref 5–15)
BUN: 32 mg/dL — ABNORMAL HIGH (ref 8–23)
CO2: 34 mmol/L — ABNORMAL HIGH (ref 22–32)
Calcium: 8.3 mg/dL — ABNORMAL LOW (ref 8.9–10.3)
Chloride: 89 mmol/L — ABNORMAL LOW (ref 98–111)
Creatinine, Ser: 1.26 mg/dL — ABNORMAL HIGH (ref 0.61–1.24)
GFR calc Af Amer: >60 mL/min (ref >60)
GFR calc non Af Amer: 55 mL/min — ABNORMAL LOW (ref >60)
Glucose, Bld: 165 mg/dL — ABNORMAL HIGH (ref 70–99)
Potassium: 4.5 mmol/L (ref 3.5–5.1)
Sodium: 134 mmol/L — ABNORMAL LOW (ref 135–145)

## 2019-09-22 LAB — GLUCOSE, CAPILLARY
Glucose-Capillary: 184 mg/dL — ABNORMAL HIGH (ref 70–99)
Glucose-Capillary: 156 mg/dL — ABNORMAL HIGH (ref 70–99)
Glucose-Capillary: 149 mg/dL — ABNORMAL HIGH (ref 70–99)
Glucose-Capillary: 175 mg/dL — ABNORMAL HIGH (ref 70–99)

## 2019-09-22 LAB — PHOSPHORUS: Phosphorus: 2.2 mg/dL — ABNORMAL LOW (ref 2.5–4.6)

## 2019-09-22 LAB — CBC
HCT: 31.9 % — ABNORMAL LOW (ref 39.0–52.0)
Hemoglobin: 9.4 g/dL — ABNORMAL LOW (ref 13.0–17.0)
MCH: 27.2 pg (ref 26.0–34.0)
MCHC: 29.5 g/dL — ABNORMAL LOW (ref 30.0–36.0)
MCV: 92.2 fL (ref 80.0–100.0)
Platelets: 166 10*3/uL (ref 150–400)
RBC: 3.46 MIL/uL — ABNORMAL LOW (ref 4.22–5.81)
RDW: 16.2 % — ABNORMAL HIGH (ref 11.5–15.5)
WBC: 10.6 10*3/uL — ABNORMAL HIGH (ref 4.0–10.5)
nRBC: 0 % (ref 0.0–0.2)

## 2019-09-22 LAB — MAGNESIUM: Magnesium: 2.1 mg/dL (ref 1.7–2.4)

## 2019-09-22 MED ORDER — INSULIN GLARGINE 100 UNIT/ML ~~LOC~~ SOLN
30.0000 [IU] | Freq: Two times a day (BID) | SUBCUTANEOUS | Status: DC
  Administered 2019-09-22 (×2): 30 [IU] via SUBCUTANEOUS
  Filled 2019-09-22 (×3): qty 0.3

## 2019-09-22 MED ORDER — ACETAMINOPHEN 325 MG PO TABS
650.0000 mg | ORAL_TABLET | Freq: Four times a day (QID) | ORAL | Status: DC | PRN
  Administered 2019-09-22 – 2019-09-29 (×15): 650 mg via ORAL
  Filled 2019-09-22 (×17): qty 2

## 2019-09-22 MED ORDER — GABAPENTIN 400 MG PO CAPS
400.0000 mg | ORAL_CAPSULE | Freq: Four times a day (QID) | ORAL | Status: DC
  Administered 2019-09-22 – 2019-09-29 (×26): 400 mg via ORAL
  Filled 2019-09-22 (×9): qty 1
  Filled 2019-09-22: qty 4
  Filled 2019-09-22 (×9): qty 1
  Filled 2019-09-22: qty 4
  Filled 2019-09-22 (×7): qty 1

## 2019-09-22 NOTE — Progress Notes (Addendum)
NAME:  [name], MRN:  [id number], DOB:  13-Aug-1941, LOS: 1 ADMISSION DATE:  09/20/2019, CONSULTATION DATE:  09/21/2019 REFERRING MD:  [facility], CHIEF COMPLAINT:  Overdose, respiratory failure   Brief History   78 yo M with a history of COPD on 4L [place], diastolic heart failure, DM2, morbid obesity and psych disorder (bipolar disorder) who presents with lethargy, respiratory failure after opiate overdose.   History of present illness   History is taken mostly from patient and is limited.  Patient evidently called EMS due to feeling weak, and evidently was told that his blood sugar was low in the 50s when EMS arrived.  He also told me (though he cannot remember exactly when it was), that he took 12 pills of hydrocodone- said that he did not know how he got them.  When he arrived in the ED, blood sugar was normal, and patient did get suddenly lethargic when he was being evaluated.  ABG showed respiratory acidosis with 7/32/74, and patient was given narcan and placed on BIPAP.  He had improvement in his mental status and was speaking in full sentences when I saw him.  He could not give me much of his medical history, mainly because his wife takes care of his medications.   Notably, he was recently admitted for an overdose and was discharged from the ED after being seen by psychiatry- they did not feel it was an intentional overdose.    Otherwise, he denies any fevers or chills.  Does have some nonspecific abdominal pain worse in LLQ and epigastric region.    Past Medical History   Past Medical History:  Diagnosis Date  . AAA (abdominal aortic aneurysm) ([place])    a. 12/2008 s/p 7cm, endovascular repair with coiling right hypogastric artery   . Acute Cameron ulcer   . Adenomatous duodenal polyp   . Allergic rhinitis, cause unspecified   . Anxiety   . AVM (arteriovenous malformation) of colon with hemorrhage   . Bipolar 1 disorder, mixed, moderate ([place]) 04/16/2015  . CAD (coronary artery  disease)    a. 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  . Chronic diastolic CHF (congestive heart failure) ([place])    a. 04/2015 Echo: EF 55-60%, no rwma, Gr 1 DD, mild AI; b. 04/2019 Echo: EF 55-60%. Mod diast dysfxn. Mild AS/AI.   Marland Kitchen Complication of anesthesia    "if they sedate me for too long, they have to intubate me; then they can't get me to come out of it" (04/03/2017)  . COPD (chronic obstructive pulmonary disease) ([place])    a. GOLD stage IV, started home O2. Severe bullous disease of LUL. Prolonged intubation after surgeries due to COPD.  Marland Kitchen Depression with anxiety 01/14/2013  . Diabetes mellitus with complication ([place])   . Diverticulosis   . Duodenal diverticulum   . Duodenal ulcer   . Emphysema of lung ([place])   . Esophagitis   . Essential hypertension 08/18/2009   Qualifier: Diagnosis of  By: Doy Mince LPN, Megan    . GERD (gastroesophageal reflux disease)   . GI bleed requiring more than 4 units of blood in 24 hours, ICU, or surgery    a. Hx bleeding gastric polyps, cecal & sigmoid AVMS s/p APC 03/30/14  . Hiatal hernia    large  . History of blood transfusion    "many many many; related to blood loss; anemia"  . Hyperlipidemia   . Insomnia 08/10/2014  . Leucocytosis 12/04/2013  .  Major depressive disorder   . Memory loss   . Morbid obesity ([place])   . Multiple gastric polyps   . Myocardial infarction ([place])    "I think I had a minor one when I had the OHS"  . On home oxygen therapy    "7 liters [place] w/oxigenator" (04/03/2017)  . Pneumonia 2017  . Recurrent Microcytic Anemia    a. presumed chronic GI blood loss.  . Type II diabetes mellitus ([place])   . Vitamin D deficiency 08/10/2014     Significant Hospital Events     Consults:    Procedures:    Significant Diagnostic Tests:  ABG 7.32/74  Micro Data:  Blood cultures x2 09/21/2019  Antimicrobials:  Holding antibiotics   Interim history/subjective:  Wore BiPAP for part of the night last  night, denies any significant dyspnea He had felt constipated, was able to have a bowel movement this morning. Complains of Foley catheter pain Was seen by psychiatry 12/5, unable to contract for safety at that time.  Inpatient geriatric psychiatric treatment recommended  Objective   Blood pressure 121/76, pulse 89, temperature 97.8 F (36.6 C), temperature source Oral, resp. rate 18, height 6\' 4"  (1.93 m), weight 125.5 kg, SpO2 98 %.    Vent Mode: BIPAP FiO2 (%):  [30 %] 30 % Set Rate:  [12 bmp] 12 bmp PEEP:  [5 cmH20] 5 cmH20   Intake/Output Summary (Last 24 hours) at 09/22/2019 [id number] Last data filed at 09/22/2019 0200 Gross per 24 hour  Intake 480 ml  Output 1865 ml  Net -1385 ml   Filed Weights   09/21/19 0113 09/21/19 0620  Weight: 126.1 kg 125.5 kg    Examination: General: Obese man, no distress, able to converse HENT: Large neck, crowded oropharynx, pupils reactive Lungs: Decreased bilateral breath sounds, no wheezes, no crackles Cardiovascular: Regular, no murmur Abdomen: Soft, mildly distended, no rebound or guarding, positive bowel sounds Extremities: Some scattered bruising Neuro: Awake, interacting, well oriented, moves all extremities and follows commands.  Less anxiety today.  He is able to contract for safety on my direct questioning today 12/6 GU: foley in place, he complains of pain from the catheter  Resolved Hospital Problem list     Assessment & Plan:  78 yo M with a history of COPD (on 4L O2), OSA non-compliant with bipap, diastolic heart failure, bipolar disorder who presented after apparent opiate overdose (~12 pills of hydrocodone) with mild respiratory acidosis, given narcan and started on bipap.  Improved 12/5  # Acute overdose: UDS + for opiates, likely hydrocodone.  He acknowledges that he was depressed and trying to hurt himself. -Appreciate psychiatry evaluation.  Celexa continued and geriatric psychiatric inpatient therapy was recommended -he  contracts for safety w me today 12/6 > will d/c suicide precautions and safety sitter -No longer needs BiPAP for suppressed mental status, hypercapnia.  He does need it for maintenance OSA therapy (marginal compliance) -Continue home gabapentin  # Acute on chronic respiratory failure, narcotics superimposed on COPD.  Improved # COPD / OSA overlap syndrome, end stage, nocturnal BiPAP at home (poor compliance) -Change BiPAP to nightly only.  Needs to work on compliance -Scheduled DuoNeb, albuterol as needed for another day.  Transition back to his home bronchodilator regimen on 12/7 -No evidence for acute exacerbation, deferred steroids and antibiotics  # Diastolic heart failure:  Currently not in acute exacerbation -Continue home lisinopril -Continue half home dose torsemide for now, likely increase back to his usual home dose on 12/7 -  On home propranolol  # DM: Home regimen lantus 35 BID with meal time lispro.  CBG 165-175 since admission -Increase Lantus to 25 units twice daily with sliding scale insulin coverage -Continue start carb modified, heart healthy diet  #constipation, improved - bowel regimen  #foley pain - remove foley catheter 12/6   Best practice:  Diet: heart healthy carb modified diet Pain/Anxiety/Delirium protocol (if indicated): N/A VAP protocol (if indicated): n/a DVT prophylaxis: lovenox GI prophylaxis: n/a Glucose control: Lantus, sliding scale insulin Mobility: Out of bed Code Status: Notes indicate DNR back in July, has told me this admission that he wants any and all interventions made to keep him alive Family Communication:  Disposition: Stepdown  Labs   CBC: Recent Labs  Lab 09/15/19 1140 09/20/19 2314 09/21/19 0513 09/22/19 0249  WBC 15.2* 12.4* 9.7 10.6*  HGB 10.7* 10.3* 10.1* 9.4*  HCT 37.0* 35.4* 34.8* 31.9*  MCV 91.6 94.7 94.6 92.2  PLT 301 182 178 [id number]    Basic Metabolic Panel: Recent Labs  Lab 09/15/19 1140 09/20/19 2314  09/21/19 0513 09/22/19 0249  NA 134* 136 135 134*  K 3.9 4.1 4.4 4.5  CL 86* 88* 87* 89*  CO2 34* 35* 34* 34*  GLUCOSE 96 175* 172* 165*  BUN 39* 42* 41* 32*  CREATININE 1.45* 1.31* 1.21 1.26*  CALCIUM 8.6* 8.4* 8.3* 8.3*  MG  --   --  2.1 2.1  PHOS  --   --  2.9  2.9 2.2*   GFR: Estimated Creatinine Clearance: 71 mL/min (A) (by C-G formula based on SCr of 1.26 mg/dL (H)). Recent Labs  Lab 09/15/19 1140 09/20/19 2314 09/21/19 0513 09/22/19 0249  WBC 15.2* 12.4* 9.7 10.6*    Liver Function Tests: Recent Labs  Lab 09/15/19 1140 09/20/19 2314  AST 15 14*  ALT 17 16  ALKPHOS 102 112  BILITOT 0.9 0.6  PROT 7.1 6.9  ALBUMIN 3.6 3.7   Recent Labs  Lab 09/21/19 0513  LIPASE 33   No results for input(s): AMMONIA in the last 168 hours.  ABG    Component Value Date/Time   PHART 7.325 (L) 09/20/2019 2335   PCO2ART 73.7 (HH) 09/20/2019 2335   PO2ART 157 (H) 09/20/2019 2335   HCO3 39.6 (H) 09/21/2019 0647   TCO2 33 (H) 10/12/2018 0027   ACIDBASEDEF 2.0 12/23/2008 0331   O2SAT 57.3 09/21/2019 0647     Coagulation Profile: Recent Labs  Lab 09/21/19 0513  INR 0.9    Cardiac Enzymes: No results for input(s): CKTOTAL, CKMB, CKMBINDEX, TROPONINI in the last 168 hours.  HbA1C: Hgb A1c MFr Bld  Date/Time Value Ref Range Status  09/15/2019 11:40 AM 6.6 (H) 4.8 - 5.6 % Final    Comment:    (NOTE) Pre diabetes:          5.7%-6.4% Diabetes:              >6.4% Glycemic control for   <7.0% adults with diabetes   08/04/2019 04:33 AM 6.8 (H) 4.8 - 5.6 % Final    Comment:    (NOTE)         Prediabetes: 5.7 - 6.4         Diabetes: >6.4         Glycemic control for adults with diabetes: <7.0     CBG: Recent Labs  Lab 09/21/19 0812 09/21/19 1141 09/21/19 1535 09/21/19 1933 09/21/19 2155  GLUCAP 150* 217* 161* 180* 184*     Critical care time: N/A  Baltazar Apo, MD, PhD 09/22/2019, 9:07 AM [facility] [phone] or if  no answer [phone]

## 2019-09-22 NOTE — Progress Notes (Signed)
Patient took himself off of BIPAP. Patient on 6 Liters oxygen and is fully awake.

## 2019-09-23 ENCOUNTER — Ambulatory Visit: Payer: Medicare HMO | Admitting: Family

## 2019-09-23 DIAGNOSIS — T50902A Poisoning by unspecified drugs, medicaments and biological substances, intentional self-harm, initial encounter: Secondary | ICD-10-CM

## 2019-09-23 LAB — CBC
HCT: 29.7 % — ABNORMAL LOW (ref 39.0–52.0)
Hemoglobin: 8.8 g/dL — ABNORMAL LOW (ref 13.0–17.0)
MCH: 27.1 pg (ref 26.0–34.0)
MCHC: 29.6 g/dL — ABNORMAL LOW (ref 30.0–36.0)
MCV: 91.4 fL (ref 80.0–100.0)
Platelets: 149 10*3/uL — ABNORMAL LOW (ref 150–400)
RBC: 3.25 MIL/uL — ABNORMAL LOW (ref 4.22–5.81)
RDW: 15.9 % — ABNORMAL HIGH (ref 11.5–15.5)
WBC: 8.8 10*3/uL (ref 4.0–10.5)
nRBC: 0 % (ref 0.0–0.2)

## 2019-09-23 LAB — GLUCOSE, CAPILLARY
Glucose-Capillary: 144 mg/dL — ABNORMAL HIGH (ref 70–99)
Glucose-Capillary: 152 mg/dL — ABNORMAL HIGH (ref 70–99)
Glucose-Capillary: 167 mg/dL — ABNORMAL HIGH (ref 70–99)
Glucose-Capillary: 155 mg/dL — ABNORMAL HIGH (ref 70–99)
Glucose-Capillary: 150 mg/dL — ABNORMAL HIGH (ref 70–99)
Glucose-Capillary: 188 mg/dL — ABNORMAL HIGH (ref 70–99)

## 2019-09-23 LAB — BASIC METABOLIC PANEL
Anion gap: 11 (ref 5–15)
BUN: 32 mg/dL — ABNORMAL HIGH (ref 8–23)
CO2: 34 mmol/L — ABNORMAL HIGH (ref 22–32)
Calcium: 8.2 mg/dL — ABNORMAL LOW (ref 8.9–10.3)
Chloride: 88 mmol/L — ABNORMAL LOW (ref 98–111)
Creatinine, Ser: 1.2 mg/dL (ref 0.61–1.24)
GFR calc Af Amer: >60 mL/min (ref >60)
GFR calc non Af Amer: 58 mL/min — ABNORMAL LOW (ref >60)
Glucose, Bld: 187 mg/dL — ABNORMAL HIGH (ref 70–99)
Potassium: 4.1 mmol/L (ref 3.5–5.1)
Sodium: 133 mmol/L — ABNORMAL LOW (ref 135–145)

## 2019-09-23 LAB — MAGNESIUM: Magnesium: 2 mg/dL (ref 1.7–2.4)

## 2019-09-23 MED ORDER — UMECLIDINIUM BROMIDE 62.5 MCG/INH IN AEPB
1.0000 | INHALATION_SPRAY | Freq: Every day | RESPIRATORY_TRACT | Status: DC
  Administered 2019-09-23 – 2019-09-29 (×6): 1 via RESPIRATORY_TRACT
  Filled 2019-09-23: qty 7

## 2019-09-23 MED ORDER — ALBUTEROL SULFATE HFA 108 (90 BASE) MCG/ACT IN AERS: 1.0000 | INHALATION_SPRAY | Freq: Four times a day (QID) | RESPIRATORY_TRACT | Status: DC | PRN

## 2019-09-23 MED ORDER — TIOTROPIUM BROMIDE MONOHYDRATE 18 MCG IN CAPS: 18.0000 ug | ORAL_CAPSULE | Freq: Every day | RESPIRATORY_TRACT | Status: DC

## 2019-09-23 MED ORDER — ALBUTEROL SULFATE (2.5 MG/3ML) 0.083% IN NEBU
2.5000 mg | INHALATION_SOLUTION | Freq: Four times a day (QID) | RESPIRATORY_TRACT | Status: DC | PRN
  Administered 2019-09-23 – 2019-09-26 (×2): 2.5 mg via RESPIRATORY_TRACT
  Filled 2019-09-23 (×2): qty 3

## 2019-09-23 MED ORDER — INSULIN GLARGINE 100 UNIT/ML ~~LOC~~ SOLN
35.0000 [IU] | Freq: Two times a day (BID) | SUBCUTANEOUS | Status: DC
  Administered 2019-09-23 – 2019-09-29 (×13): 35 [IU] via SUBCUTANEOUS
  Filled 2019-09-23 (×14): qty 0.35

## 2019-09-23 MED ORDER — TORSEMIDE 20 MG PO TABS
40.0000 mg | ORAL_TABLET | Freq: Two times a day (BID) | ORAL | Status: DC
  Administered 2019-09-23 – 2019-09-29 (×12): 40 mg via ORAL
  Filled 2019-09-23 (×14): qty 2

## 2019-09-23 MED ORDER — GUAIFENESIN-DM 100-10 MG/5ML PO SYRP
10.0000 mL | ORAL_SOLUTION | ORAL | Status: DC | PRN
  Administered 2019-09-23 – 2019-09-28 (×4): 10 mL via ORAL
  Filled 2019-09-23 (×4): qty 10

## 2019-09-23 NOTE — Progress Notes (Addendum)
NAME:  [name], MRN:  [id number], DOB:  Feb 04, 1941, LOS: 2 ADMISSION DATE:  09/20/2019, CONSULTATION DATE:  09/21/2019 REFERRING MD:  [facility], CHIEF COMPLAINT:  Overdose, respiratory failure   Brief History   78 yo M with a history of COPD on 4L [place], diastolic heart failure, DM2, morbid obesity and psych disorder (bipolar disorder) who presents with lethargy, respiratory failure after opiate overdose.   History of present illness   History is taken mostly from patient and is limited.  Patient evidently called EMS due to feeling weak, and evidently was told that his blood sugar was low in the 50s when EMS arrived.  He also told me (though he cannot remember exactly when it was), that he took 12 pills of hydrocodone- said that he did not know how he got them.  When he arrived in the ED, blood sugar was normal, and patient did get suddenly lethargic when he was being evaluated.  ABG showed respiratory acidosis with 7/32/74, and patient was given narcan and placed on BIPAP.  He had improvement in his mental status and was speaking in full sentences when I saw him.  He could not give me much of his medical history, mainly because his wife takes care of his medications.   Notably, he was recently admitted for an overdose and was discharged from the ED after being seen by psychiatry- they did not feel it was an intentional overdose.    Otherwise, he denies any fevers or chills.  Does have some nonspecific abdominal pain worse in LLQ and epigastric region.    Past Medical History   Past Medical History:  Diagnosis Date  . AAA (abdominal aortic aneurysm) ([place])    a. 12/2008 s/p 7cm, endovascular repair with coiling right hypogastric artery   . Acute Cameron ulcer   . Adenomatous duodenal polyp   . Allergic rhinitis, cause unspecified   . Anxiety   . AVM (arteriovenous malformation) of colon with hemorrhage   . Bipolar 1 disorder, mixed, moderate ([place]) 04/16/2015  . CAD (coronary artery  disease)    a. 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  . Chronic diastolic CHF (congestive heart failure) ([place])    a. 04/2015 Echo: EF 55-60%, no rwma, Gr 1 DD, mild AI; b. 04/2019 Echo: EF 55-60%. Mod diast dysfxn. Mild AS/AI.   Marland Kitchen Complication of anesthesia    "if they sedate me for too long, they have to intubate me; then they can't get me to come out of it" (04/03/2017)  . COPD (chronic obstructive pulmonary disease) ([place])    a. GOLD stage IV, started home O2. Severe bullous disease of LUL. Prolonged intubation after surgeries due to COPD.  Marland Kitchen Depression with anxiety 01/14/2013  . Diabetes mellitus with complication ([place])   . Diverticulosis   . Duodenal diverticulum   . Duodenal ulcer   . Emphysema of lung ([place])   . Esophagitis   . Essential hypertension 08/18/2009   Qualifier: Diagnosis of  By: Doy Mince LPN, Megan    . GERD (gastroesophageal reflux disease)   . GI bleed requiring more than 4 units of blood in 24 hours, ICU, or surgery    a. Hx bleeding gastric polyps, cecal & sigmoid AVMS s/p APC 03/30/14  . Hiatal hernia    large  . History of blood transfusion    "many many many; related to blood loss; anemia"  . Hyperlipidemia   . Insomnia 08/10/2014  . Leucocytosis 12/04/2013  .  Major depressive disorder   . Memory loss   . Morbid obesity ([place])   . Multiple gastric polyps   . Myocardial infarction ([place])    "I think I had a minor one when I had the OHS"  . On home oxygen therapy    "7 liters [place] w/oxigenator" (04/03/2017)  . Pneumonia 2017  . Recurrent Microcytic Anemia    a. presumed chronic GI blood loss.  . Type II diabetes mellitus ([place])   . Vitamin D deficiency 08/10/2014    Significant Hospital Events   12/5 > admit 12/5 > psych consult > recommending inpatient geriatric psych admission  Consults:  Psych  Procedures:    Significant Diagnostic Tests:    Micro Data:  Blood cultures x2 12/5 >   Antimicrobials:  Holding antibiotics    Interim history/subjective:  No complaints.  Frustrated that psych recommending inpatient admission.  Says "god only knows where you'll send me. Maybe it'll be [place]"   Objective   Blood pressure 117/82, pulse 83, temperature 98.2 F (36.8 C), temperature source Oral, resp. rate (!) 21, height 6\' 4"  (1.93 m), weight 122.4 kg, SpO2 96 %.        Intake/Output Summary (Last 24 hours) at 09/23/2019 0844 Last data filed at 09/23/2019 0600 Gross per 24 hour  Intake 1660 ml  Output 2350 ml  Net -690 ml   Filed Weights   09/21/19 0113 09/21/19 0620 09/23/19 0423  Weight: 126.1 kg 125.5 kg 122.4 kg    Examination: General: Adult male, sitting on side of bed eating breakfast, in NAD. Neuro: A&O x 3, no deficits. HEENT: [place]/AT. Sclerae anicteric. EOMI. Cardiovascular: RRR, no M/R/G.  Lungs: Respirations even and unlabored.  CTA bilaterally, No W/R/R. Abdomen: BS x 4, soft, NT/ND.  Musculoskeletal: No gross deformities, no edema.  Skin: Intact, warm, no rashes.  Assessment & Plan:   78 yo M with a history of COPD (on 4L O2), OSA non-compliant with bipap, diastolic heart failure, bipolar disorder who presented after apparent opiate overdose (~12 pills of hydrocodone) with mild respiratory acidosis, given narcan and started on bipap.  Improved 12/5  # Acute overdose: UDS + for opiates, likely hydrocodone.  He acknowledges that he was depressed and trying to hurt himself. -Appreciate psychiatry evaluation.  Celexa continued and geriatric psychiatric inpatient therapy was recommended -Social work consult placed for inquiry on bed placement -No longer needs BiPAP for suppressed mental status, hypercapnia.  He does need it for maintenance OSA therapy though (marginal compliance) -Continue home gabapentin  # Acute on chronic respiratory failure, narcotics superimposed on COPD.  Improved # COPD / OSA overlap syndrome, end stage, nocturnal BiPAP at home (poor compliance) -Change BiPAP to  nightly only.  Needs to work on compliance -Transition back to home BD regimen of spiriva, PRN albuterol -No evidence for acute exacerbation, deferred steroids and antibiotics  # Diastolic heart failure:  Currently not in acute exacerbation -Continue home lisinopril, torsemide, propranolol  # DM: Home regimen lantus 35 BID with meal time novolog.  CBG 165-175 since admission -Continue lantus at home dose and SSI QHS / AC -Continue carb modified, heart healthy diet  #constipation, improved - bowel regimen   Best practice:  Diet: heart healthy carb modified diet Pain/Anxiety/Delirium protocol (if indicated): N/A VAP protocol (if indicated): n/a DVT prophylaxis: lovenox GI prophylaxis: n/a Glucose control: Lantus, sliding scale insulin Mobility: Out of bed Code Status: Notes indicate DNR back in July, has told me this admission that he wants  any and all interventions made to keep him alive Family Communication:   Will call. Disposition: Transfer to med-surge while awaiting bed availability at inpatient psych.  Will transfer to [facility] service with PCCM off 12/8.  Please call back if further assistance is required or if bed becomes available by 12/8.   Montey Hora, [facility] 09/23/2019, 8:55 AM

## 2019-09-23 NOTE — Progress Notes (Signed)
eLink Physician-Brief Progress Note Patient Name: DARRAL KHOSLA DOB: 03-Oct-1941 MRN: [id number]   Date of Service  09/23/2019  HPI/Events of Note  Cough - Patient requests cough suppressant.    eICU Interventions  Will order: 1. Robitussin DM 10 mL PO Q 4 hours PRN cough.      Intervention Category Major Interventions: Other:  Besnik Febus Cornelia Copa 09/23/2019, 4:02 AM

## 2019-09-23 NOTE — Progress Notes (Addendum)
Pt seen and not ready for bipap at this time.  This Probation officer will return after 2300 per pt request.

## 2019-09-24 LAB — CBC
HCT: 32.3 % — ABNORMAL LOW (ref 39.0–52.0)
Hemoglobin: 9.5 g/dL — ABNORMAL LOW (ref 13.0–17.0)
MCH: 27.1 pg (ref 26.0–34.0)
MCHC: 29.4 g/dL — ABNORMAL LOW (ref 30.0–36.0)
MCV: 92.3 fL (ref 80.0–100.0)
Platelets: 161 10*3/uL (ref 150–400)
RBC: 3.5 MIL/uL — ABNORMAL LOW (ref 4.22–5.81)
RDW: 16.1 % — ABNORMAL HIGH (ref 11.5–15.5)
WBC: 9.3 10*3/uL (ref 4.0–10.5)
nRBC: 0 % (ref 0.0–0.2)

## 2019-09-24 LAB — BASIC METABOLIC PANEL
Anion gap: 11 (ref 5–15)
BUN: 28 mg/dL — ABNORMAL HIGH (ref 8–23)
CO2: 31 mmol/L (ref 22–32)
Calcium: 8.3 mg/dL — ABNORMAL LOW (ref 8.9–10.3)
Chloride: 89 mmol/L — ABNORMAL LOW (ref 98–111)
Creatinine, Ser: 1.26 mg/dL — ABNORMAL HIGH (ref 0.61–1.24)
GFR calc Af Amer: >60 mL/min (ref >60)
GFR calc non Af Amer: 55 mL/min — ABNORMAL LOW (ref >60)
Glucose, Bld: 158 mg/dL — ABNORMAL HIGH (ref 70–99)
Potassium: 4.3 mmol/L (ref 3.5–5.1)
Sodium: 131 mmol/L — ABNORMAL LOW (ref 135–145)

## 2019-09-24 LAB — GLUCOSE, CAPILLARY
Glucose-Capillary: 136 mg/dL — ABNORMAL HIGH (ref 70–99)
Glucose-Capillary: 149 mg/dL — ABNORMAL HIGH (ref 70–99)
Glucose-Capillary: 188 mg/dL — ABNORMAL HIGH (ref 70–99)
Glucose-Capillary: 184 mg/dL — ABNORMAL HIGH (ref 70–99)

## 2019-09-24 LAB — MAGNESIUM: Magnesium: 2 mg/dL (ref 1.7–2.4)

## 2019-09-24 LAB — PHOSPHORUS: Phosphorus: 2.9 mg/dL (ref 2.5–4.6)

## 2019-09-24 NOTE — TOC Initial Note (Signed)
Transition of Care [facility]) - Initial/Assessment Note    Patient Details  Name: [name] MRN: [id number] Date of Birth: 1941/06/25  Transition of Care [facility]) CM/SW Contact:    Wende Neighbors, LCSW Phone Number: 09/24/2019, 3:02 PM  Clinical Narrative:    CSW received consult to assist patient in discharging to a geriatric psychiatric facility. CSW faxed patient out to [facility] and [facility]. CSW awaiting response back from facility                     Patient Goals and CMS Choice        Expected Discharge Plan and Services                                                Prior Living Arrangements/Services                       Activities of Daily Living Home Assistive Devices/Equipment: Gilford Rile (specify type) ADL Screening (condition at time of admission) Patient's cognitive ability adequate to safely complete daily activities?: Yes Is the patient deaf or have difficulty hearing?: No Does the patient have difficulty seeing, even when wearing glasses/contacts?: Yes Does the patient have difficulty concentrating, remembering, or making decisions?: No Patient able to express need for assistance with ADLs?: Yes Does the patient have difficulty dressing or bathing?: No Independently performs ADLs?: Yes (appropriate for developmental age) Does the patient have difficulty walking or climbing stairs?: Yes Weakness of Legs: Both Weakness of Arms/Hands: Both  Permission Sought/Granted                  Emotional Assessment              Admission diagnosis:  Hypercapnia [R06.89] Altered mental status, unspecified altered mental status type [R41.82] Patient Active Problem List   Diagnosis Date Noted  . Overdose 09/21/2019  . OSA on CPAP   . Advanced care planning/counseling discussion   . Encounter for competency evaluation   . Chest pain 08/04/2019  . Acute respiratory failure with hypoxia and hypercapnia ([place]) 07/06/2019  .  Acute febrile illness   . Hyperglycemia   . Weakness 05/05/2019  . Febrile illness 04/27/2019  . Breathlessness   . Goals of care, counseling/discussion   . Palliative care by specialist   . Encounter for hospice care discussion   . COPD with acute exacerbation ([place]) 04/06/2019  . COPD exacerbation ([place]) 02/28/2019  . Community acquired pneumonia of left lower lobe of lung   . Hypoxia 12/26/2018  . Acute on chronic respiratory failure with hypoxia ([place])   . Acute on chronic diastolic CHF (congestive heart failure) ([place]) 10/02/2018  . Acute respiratory failure ([place]) 10/02/2018  . Acute Cameron ulcer   . Acute GI bleeding 02/08/2018  . Anemia due to chronic kidney disease   . AKI (acute kidney injury) ([place]) 05/08/2017  . Diabetes mellitus with complication ([place]) [id number]  . Melena   . Benign neoplasm of sigmoid colon   . Benign neoplasm of descending colon   . AVM (arteriovenous malformation) of colon with hemorrhage   . GI bleed 04/05/2017  . Intermittent left lower quadrant abdominal pain   . Generalized weakness 11/09/2016  . Symptomatic anemia 10/21/2016  . Low back pain 07/02/2015  . Gastric AVM   . AVM (arteriovenous  malformation) of duodenum, acquired with hemorrhage   . Bipolar 1 disorder, mixed, moderate ([place]) 04/16/2015  . Major depressive disorder, recurrent, severe without psychotic features ([place])   . Supplemental oxygen dependent 11/30/2014  . Iron deficiency anemia due to chronic blood loss 11/25/2014  . Vitamin D deficiency 08/10/2014  . Insomnia 08/10/2014  . Polypharmacy 08/10/2014  . Chronic pain syndrome 08/10/2014  . 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 Abdominal pain 12/04/2013  . Aftercare following surgery of the circulatory system, [place] 11/04/2013  . Acute blood loss anemia 10/16/2013  . Multiple gastric polyps 09/10/2013  . Anxiety state 09/10/2013  . 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 Chronic hypoxemic respiratory failure ([place]) 05/21/2013  . Acute on chronic respiratory failure with hypoxia and hypercapnia ([place]) 12/04/2012  . Chronic GI bleeding 05/17/2012  . CAD (coronary artery disease) 04/17/2012  . Chronic diastolic CHF (congestive heart failure) ([place]) 03/27/2012  . COPD (chronic obstructive pulmonary disease) ([place]) 09/13/2011  . CERUMEN IMPACTION, BILATERAL 11/11/2010  . Hyperlipidemia 08/18/2009  . Essential hypertension 08/18/2009  . ALLERGIC RHINITIS 08/18/2009  . AAA (abdominal aortic aneurysm) ([place]) 12/15/2008   PCP:  [name], MD Pharmacy:   [id number] [place], [address] [place] [address] Phone: [phone] Fax: [facility] [place], [place] [address] Phone: [phone] Fax: [phone]     Social Determinants of Health (SDOH) Interventions    Readmission Risk Interventions Readmission Risk Prevention Plan 08/15/2019 07/08/2019 04/13/2019  Transportation Screening Complete Complete Complete  Medication Review (RN Care Manager) Referral to Pharmacy Complete Complete  PCP or Specialist appointment within 3-5 days of discharge Complete Complete Complete  HRI or Home Care Consult Complete Complete Complete  SW Recovery Care/Counseling Consult - Complete Complete  Palliative Care Screening Complete Patient Refused Patient [place] Complete Not Applicable Patient Refused  Some recent data might be hidden

## 2019-09-24 NOTE — Progress Notes (Signed)
PROGRESS NOTE  CAVANAUGH BUTZLAFF  DOB: 1941/01/01  PCP: [name], MD [id number]  DOA: 09/20/2019  LOS: 3 days   Chief Complaint  Patient presents with  . Altered Mental Status   Brief narrative: Patient is a 78 yo M with a history of COPD on 6L [place], diastolic heart failure, DM2, CAD, morbid obesity and psych disorder (bipolar disorder). Patient was brought to the ED on 12/4 by EMS from home for weakness and low blood sugar.  Apparently, patient took 12 pills of hydrocodone with suicidal intent.  In the ED, he became lethargic.  Blood gas showed pH of 7.  He was given Narcan, started on BiPAP and admitted to critical care service.  Patient was also recently admitted on 11/19 appropriate overdose.  It was suspected to be suicidal at the time.  After psychiatry evaluation was obtained, he was determined to be not suicidal and was hence discharged home. 12/5, psychiatry consultation was obtained.  Recommended geriatric psychiatry admission.  He clinically improved and was transferred to medical service on 12/7.  Subjective: Patient was seen and examined this morning.  Elderly male. On 6 L oxygen by nasal cannula.  Not in distress.  Seems irritable.  Assessment and plan: Acute intentional opioid overdose Suicidal attempt -UDS + for opiates, likely hydrocodone.  He acknowledged that he was depressed and trying to hurt himself. -Appreciate psychiatry evaluation.  Celexa continued. -Geriatric psychiatric inpatient therapy was recommended. -Social work consulted for inquiry on bed placement  Acute on chronic respiratory failure with hypoxia -Chronically uses 6 L oxygen by nasal at home.  Required BiPAP because of narcotic overdose. -Currently on 6 lpm via [place]. -Medically stable for transfer to geriatric psychiatry unit -Continue bronchodilators.  Obstructive sleep apnea -Supposed to be on nocturnal BiPAP at home but he has poor compliance.  Diastolic heart failure:   -Currently  not in acute exacerbation -Continue home lisinopril, torsemide, propranolol  Diabetes mellitus 2  -Home regimen include lantus 35 BID with meal time novolog.  CBG 165-175 since admission -Continue lantus at home dose and SSI QHS / AC -Continue carb modified diet  Constipation- Improved with bowel regimen  Medically stable for transfer to geriatric psychiatry unit  Mobility: Encourage ambulation  Diet: heart healthy/cardiac diet Fluid: None DVT prophylaxis:  Lovenox Code Status:  Full code Family Communication:  Not at bedside Expected Discharge:  Social work looking for bed availability  Consultants:  Psychiatry 12/5  Procedures:    Antimicrobials: Anti-infectives (From admission, onward)   Start     Dose/Rate Route Frequency Ordered Stop   09/21/19 0230  aztreonam (AZACTAM) 2 g in sodium chloride 0.9 % 100 mL IVPB  Status:  Discontinued     2 g 200 mL/hr over 30 Minutes Intravenous  Once 09/21/19 0215 09/21/19 0350   09/21/19 0230  vancomycin (VANCOCIN) 2,000 mg in sodium chloride 0.9 % 500 mL IVPB  Status:  Discontinued     2,000 mg 250 mL/hr over 120 Minutes Intravenous  Once 09/21/19 0221 09/21/19 0350        Code Status: Full Code   Diet Order            Diet heart healthy/carb modified Room service appropriate? Yes; Fluid consistency: Thin  Diet effective now              Infusions:    Scheduled Meds: . atorvastatin  40 mg Oral q1800  . chlorhexidine  15 mL Mouth Rinse BID  . Chlorhexidine Gluconate Cloth  6 each Topical Daily  . citalopram  20 mg Oral Daily  . enoxaparin (LOVENOX) injection  60 mg Subcutaneous Q24H  . gabapentin  400 mg Oral QID  . insulin aspart  0-15 Units Subcutaneous TID WC  . insulin aspart  0-5 Units Subcutaneous QHS  . insulin glargine  35 Units Subcutaneous BID  . lisinopril  2.5 mg Oral Daily  . mouth rinse  15 mL Mouth Rinse q12n4p  . pantoprazole  40 mg Oral BID  . propranolol  10 mg Oral Daily  . torsemide  40  mg Oral BID  . umeclidinium bromide  1 puff Inhalation Daily    PRN meds: acetaminophen, albuterol, guaiFENesin-dextromethorphan   Objective: Vitals:   09/23/19 2310 09/24/19 0650  BP:  (!) 118/98  Pulse: 88 97  Resp: 20 17  Temp:  98.1 F (36.7 C)  SpO2: 96% 98%    Intake/Output Summary (Last 24 hours) at 09/24/2019 1256 Last data filed at 09/24/2019 1211 Gross per 24 hour  Intake 240 ml  Output 3425 ml  Net -3185 ml   Filed Weights   09/23/19 0423 09/23/19 1840 09/24/19 0500  Weight: 122.4 kg 122.4 kg 122.4 kg   Weight change: 0 kg Body mass index is 32.85 kg/m.   Physical Exam: General exam: Appears calm and comfortable.  Obese, Skin: No rashes, lesions or ulcers. HEENT: Atraumatic, normocephalic, supple neck, no obvious bleeding Lungs: Diminished air entry in both bases, no crackles or wheezing CVS: Regular rate and rhythm, no murmur GI/Abd soft, distended from obesity, nontender, bowel sound present CNS: Alert, awake, oriented x3 Psychiatry: Mood appropriate Extremities: No pedal edema, no calf tenderness  Data Review: I have personally reviewed the laboratory data and studies available.  Recent Labs  Lab 09/20/19 2314 09/21/19 0513 09/22/19 0249 09/23/19 0214 09/24/19 0556  WBC 12.4* 9.7 10.6* 8.8 9.3  HGB 10.3* 10.1* 9.4* 8.8* 9.5*  HCT 35.4* 34.8* 31.9* 29.7* 32.3*  MCV 94.7 94.6 92.2 91.4 92.3  PLT 182 178 166 149* 161   Recent Labs  Lab 09/20/19 2314 09/21/19 0513 09/22/19 0249 09/23/19 0214 09/24/19 0556  NA 136 135 134* 133* 131*  K 4.1 4.4 4.5 4.1 4.3  CL 88* 87* 89* 88* 89*  CO2 35* 34* 34* 34* 31  GLUCOSE 175* 172* 165* 187* 158*  BUN 42* 41* 32* 32* 28*  CREATININE 1.31* 1.21 1.26* 1.20 1.26*  CALCIUM 8.4* 8.3* 8.3* 8.2* 8.3*  MG  --  2.1 2.1 2.0 2.0  PHOS  --  2.9  2.9 2.2*  --  2.9    Terrilee Croak, MD  [facility] 09/24/2019

## 2019-09-24 NOTE — Care Management Important Message (Signed)
Important Message  Patient Details IM Letter given to [place] Case Manager to present to the Patient Name: DEMARRION VILLADA MRN: [id number] Date of Birth: 08/22/1941   Medicare Important Message Given:  Yes     Kerin Salen 09/24/2019, 10:15 AM

## 2019-09-25 LAB — GLUCOSE, CAPILLARY
Glucose-Capillary: 180 mg/dL — ABNORMAL HIGH (ref 70–99)
Glucose-Capillary: 135 mg/dL — ABNORMAL HIGH (ref 70–99)
Glucose-Capillary: 114 mg/dL — ABNORMAL HIGH (ref 70–99)
Glucose-Capillary: 161 mg/dL — ABNORMAL HIGH (ref 70–99)

## 2019-09-25 MED ORDER — TROLAMINE SALICYLATE 10 % EX CREA
1.0000 "application " | TOPICAL_CREAM | Freq: Two times a day (BID) | CUTANEOUS | Status: DC | PRN
  Filled 2019-09-25: qty 85

## 2019-09-25 MED ORDER — DICLOFENAC SODIUM 1 % EX GEL
4.0000 g | Freq: Two times a day (BID) | CUTANEOUS | Status: DC | PRN
  Administered 2019-09-25 – 2019-09-28 (×5): 4 g via TOPICAL
  Filled 2019-09-25: qty 100

## 2019-09-25 NOTE — TOC Progression Note (Signed)
Transition of Care [facility]) - Progression Note    Patient Details  Name: RICHIE BREITENBACH MRN: [id number] Date of Birth: [birth year]  [facility]) CM/SW Contact  Janace Hoard, [place] Phone Number: 09/25/2019, 2:04 PM  Clinical Narrative:    CSW continues to seek inpatient gero psych placement for patient. CSW has faxed patients information to the following places:   [place] - patient on wait list [facility] - declined due to medical acuity (oxygen)  [facility] - no beds today, will follow up tomorrow        Expected Discharge Plan and Services                                                 Social Determinants of Health (SDOH) Interventions    Readmission Risk Interventions Readmission Risk Prevention Plan 08/15/2019 07/08/2019 04/13/2019  Transportation Screening Complete Complete Complete  Medication Review (RN Care Manager) Referral to Pharmacy Complete Complete  PCP or Specialist appointment within 3-5 days of discharge Complete Complete Complete  HRI or Home Care Consult Complete Complete Complete  SW Recovery Care/Counseling Consult - Complete Complete  Palliative Care Screening Complete Patient Refused Patient Refused  [place] Complete Not Applicable Patient Refused  Some recent data might be hidden

## 2019-09-25 NOTE — Progress Notes (Signed)
PROGRESS NOTE  [name]  DOB: 1940-11-15  PCP: [name], MD [id number]  DOA: 09/20/2019  LOS: 4 days   Chief Complaint  Patient presents with  . Altered Mental Status   Brief narrative: Patient is a 78 yo M with a history of COPD on 6L [place], diastolic heart failure, DM2, CAD, morbid obesity and psych disorder (bipolar disorder). Patient was brought to the ED on 12/4 by EMS from home for weakness and low blood sugar.  Apparently, patient took 12 pills of hydrocodone with suicidal intent.  In the ED, he became lethargic.  Blood gas showed pH of 7.  He was given Narcan, started on BiPAP and admitted to critical care service.  Patient was also recently admitted on 11/19 appropriate overdose.  It was suspected to be suicidal at the time.  After psychiatry evaluation was obtained, he was determined to be not suicidal and was hence discharged home. 12/5, psychiatry consultation was obtained.  Recommended geriatric psychiatry admission.  He clinically improved and was transferred to medical service on 12/7.  Subjective: Patient was seen and examined this morning.  No physical complaints, continues to have irritability but reports that he is learning to "hold his tongue" and he was actually pleasant during this evaluation. Remains on 5-6L/min O2 supplementation via [place] which is his baseline requirement for his COPD.   Assessment and plan: Acute intentional opioid overdose Suicidal attempt -UDS + for opiates, likely hydrocodone.  He acknowledged that he was depressed and trying to hurt himself. -Appreciate psychiatry evaluation.  Celexa continued. -Geriatric psychiatric inpatient therapy was recommended. -Social work consulted for inquiry on bed placement  Acute on chronic respiratory failure with hypoxia -Chronically uses 6 L oxygen by nasal at home.  Required BiPAP because of narcotic overdose but no longer requiting this for respiratory suppression but uses it for OSA while  sleeping. -Currently on 5-6 lpm via [place]. -Medically stable for transfer to geriatric psychiatry unit -Continue bronchodilators.  Obstructive sleep apnea -Supposed to be on nocturnal BiPAP at home but he has poor compliance.  Diastolic heart failure:   -Currently not in acute exacerbation -Continue home lisinopril, torsemide, propranolol  Diabetes mellitus 2  -Home regimen include lantus 35 BID with meal time novolog.  CBG 165-175 since admission -Continue lantus at home dose and SSI QHS / AC -Continue carb modified diet  Constipation- Improved with bowel regimen  Medically stable for transfer to geriatric psychiatry unit  Mobility: Encourage ambulation  Diet: heart healthy/cardiac diet Fluid: None DVT prophylaxis:  Lovenox Code Status:  Full code Family Communication:  Not at bedside Expected Discharge:  Social actively working on finding bed for placement  Consultants:  Psychiatry 12/5  Procedures:    Antimicrobials: Anti-infectives (From admission, onward)   Start     Dose/Rate Route Frequency Ordered Stop   09/21/19 0230  aztreonam (AZACTAM) 2 g in sodium chloride 0.9 % 100 mL IVPB  Status:  Discontinued     2 g 200 mL/hr over 30 Minutes Intravenous  Once 09/21/19 0215 09/21/19 0350   09/21/19 0230  vancomycin (VANCOCIN) 2,000 mg in sodium chloride 0.9 % 500 mL IVPB  Status:  Discontinued     2,000 mg 250 mL/hr over 120 Minutes Intravenous  Once 09/21/19 0221 09/21/19 0350        Code Status: Full Code   Diet Order            Diet heart healthy/carb modified Room service appropriate? Yes; Fluid consistency: Thin  Diet  effective now              Infusions:    Scheduled Meds: . atorvastatin  40 mg Oral q1800  . chlorhexidine  15 mL Mouth Rinse BID  . Chlorhexidine Gluconate Cloth  6 each Topical Daily  . citalopram  20 mg Oral Daily  . enoxaparin (LOVENOX) injection  60 mg Subcutaneous Q24H  . gabapentin  400 mg Oral QID  . insulin aspart  0-15  Units Subcutaneous TID WC  . insulin aspart  0-5 Units Subcutaneous QHS  . insulin glargine  35 Units Subcutaneous BID  . lisinopril  2.5 mg Oral Daily  . mouth rinse  15 mL Mouth Rinse q12n4p  . pantoprazole  40 mg Oral BID  . propranolol  10 mg Oral Daily  . torsemide  40 mg Oral BID  . umeclidinium bromide  1 puff Inhalation Daily    PRN meds: acetaminophen, albuterol, guaiFENesin-dextromethorphan   Objective: Vitals:   09/24/19 2039 09/25/19 0454  BP: 102/60 110/66  Pulse: 81 93  Resp: 16   Temp: 98.4 F (36.9 C) 98.1 F (36.7 C)  SpO2: 97% 94%    Intake/Output Summary (Last 24 hours) at 09/25/2019 1423 Last data filed at 09/25/2019 1100 Gross per 24 hour  Intake 720 ml  Output 2103 ml  Net -1383 ml   Filed Weights   09/23/19 0423 09/23/19 1840 09/24/19 0500  Weight: 122.4 kg 122.4 kg 122.4 kg   Weight change:  Body mass index is 32.85 kg/m.   Physical Exam: General exam: Appears calm and comfortable.  Obese. Skin: No rashes, lesions or ulcers. HEENT: Atraumatic, normocephalic, supple neck, no obvious bleeding Lungs: No respiratory distress, no cough, no wheezing.  CVS: Regular rate and rhythm. GI/Abd soft, distended from obesity, nontender, bowel sound present CNS: Alert, awake, oriented x3 Psychiatry: Guarded affect.  Extremities: No pedal edema, no calf tenderness  Data Review: I have personally reviewed the laboratory data and studies available.  Recent Labs  Lab 09/20/19 2314 09/21/19 0513 09/22/19 0249 09/23/19 0214 09/24/19 0556  WBC 12.4* 9.7 10.6* 8.8 9.3  HGB 10.3* 10.1* 9.4* 8.8* 9.5*  HCT 35.4* 34.8* 31.9* 29.7* 32.3*  MCV 94.7 94.6 92.2 91.4 92.3  PLT 182 178 166 149* 161   Recent Labs  Lab 09/20/19 2314 09/21/19 0513 09/22/19 0249 09/23/19 0214 09/24/19 0556  NA 136 135 134* 133* 131*  K 4.1 4.4 4.5 4.1 4.3  CL 88* 87* 89* 88* 89*  CO2 35* 34* 34* 34* 31  GLUCOSE 175* 172* 165* 187* 158*  BUN 42* 41* 32* 32* 28*   CREATININE 1.31* 1.21 1.26* 1.20 1.26*  CALCIUM 8.4* 8.3* 8.3* 8.2* 8.3*  MG  --  2.1 2.1 2.0 2.0  PHOS  --  2.9  2.9 2.2*  --  2.9    Blain Pais, MD  [facility] 09/25/2019

## 2019-09-26 LAB — GLUCOSE, CAPILLARY
Glucose-Capillary: 139 mg/dL — ABNORMAL HIGH (ref 70–99)
Glucose-Capillary: 165 mg/dL — ABNORMAL HIGH (ref 70–99)
Glucose-Capillary: 184 mg/dL — ABNORMAL HIGH (ref 70–99)
Glucose-Capillary: 146 mg/dL — ABNORMAL HIGH (ref 70–99)

## 2019-09-26 LAB — CULTURE, BLOOD (ROUTINE X 2)
Culture: NO GROWTH
Culture: NO GROWTH
Special Requests: ADEQUATE

## 2019-09-26 NOTE — Progress Notes (Signed)
PROGRESS NOTE  [name]  DOB: [birth year]  PCP: [name], MD [id number]  DOA: 09/20/2019  LOS: 5 days   Chief Complaint  Patient presents with  . Altered Mental Status   Brief narrative: Patient is a 78 yo M with a history of COPD on 6L [place], diastolic heart failure, DM2, CAD, morbid obesity and psych disorder (bipolar disorder). Patient was brought to the ED on 12/4 by EMS from home for weakness and low blood sugar.  Apparently, patient took 12 pills of hydrocodone with suicidal intent.  In the ED, he became lethargic.  Blood gas showed pH of 7.  He was given Narcan, started on BiPAP and admitted to critical care service.  Patient was also recently admitted on 11/19 appropriate overdose.  It was suspected to be suicidal at the time.  After psychiatry evaluation was obtained, he was determined to be not suicidal and was hence discharged home. 12/5, psychiatry consultation was obtained.  Recommended geriatric psychiatry admission.  He clinically improved and was transferred to medical service on 12/7.  Subjective: Patient was seen and examined this morning.   Reports that his left knee pain improved with topical Voltaren and knee wrap. Continues to be irritable.   Assessment and plan: Acute intentional opioid overdose Suicidal attempt -UDS + for opiates, likely hydrocodone.  He acknowledged that he was depressed and trying to hurt himself. -Appreciate psychiatry evaluation.  Celexa continued. -Geriatric psychiatric inpatient therapy was recommended. -Social work consulted for inquiry on bed placement  Acute on chronic respiratory failure with hypoxia -Chronically uses 6 L oxygen by nasal at home.  Required BiPAP because of narcotic overdose but no longer requiting this for respiratory suppression but uses it for OSA while sleeping. -Currently on 5-6 lpm via [place]. -Medically stable for transfer to geriatric psychiatry unit -Continue bronchodilators.  Obstructive sleep  apnea -Supposed to be on nocturnal BiPAP at home but he has poor compliance.  Diastolic heart failure:   -Currently not in acute exacerbation -Continue home lisinopril, torsemide, propranolol  Diabetes mellitus 2  -Home regimen include lantus 35 BID with meal time novolog.  CBG 165-175 since admission -Continue lantus at home dose and SSI QHS / AC -Continue carb modified diet  Constipation- Improved with bowel regimen  Osteoarthritis or multiple joints.  Had pain in his left knee but this improved with topical Voltaren and knee elastic bandage which will be continued as prn. Continue tylenol prn.   Medically stable for transfer to geriatric psychiatry unit  Mobility: Encourage ambulation  Diet: heart healthy/cardiac diet Fluid: None DVT prophylaxis:  Lovenox Code Status:  Full code Family Communication:  Not at bedside Expected Discharge:  Social actively working on finding bed for placement  Consultants:  Psychiatry 12/5  Procedures:    Antimicrobials: Anti-infectives (From admission, onward)   Start     Dose/Rate Route Frequency Ordered Stop   09/21/19 0230  aztreonam (AZACTAM) 2 g in sodium chloride 0.9 % 100 mL IVPB  Status:  Discontinued     2 g 200 mL/hr over 30 Minutes Intravenous  Once 09/21/19 0215 09/21/19 0350   09/21/19 0230  vancomycin (VANCOCIN) 2,000 mg in sodium chloride 0.9 % 500 mL IVPB  Status:  Discontinued     2,000 mg 250 mL/hr over 120 Minutes Intravenous  Once 09/21/19 0221 09/21/19 0350        Code Status: Full Code   Diet Order            Diet heart healthy/carb  modified Room service appropriate? Yes; Fluid consistency: Thin  Diet effective now              Infusions:    Scheduled Meds: . atorvastatin  40 mg Oral q1800  . chlorhexidine  15 mL Mouth Rinse BID  . Chlorhexidine Gluconate Cloth  6 each Topical Daily  . citalopram  20 mg Oral Daily  . enoxaparin (LOVENOX) injection  60 mg Subcutaneous Q24H  . gabapentin  400 mg  Oral QID  . insulin aspart  0-15 Units Subcutaneous TID WC  . insulin aspart  0-5 Units Subcutaneous QHS  . insulin glargine  35 Units Subcutaneous BID  . lisinopril  2.5 mg Oral Daily  . mouth rinse  15 mL Mouth Rinse q12n4p  . pantoprazole  40 mg Oral BID  . propranolol  10 mg Oral Daily  . torsemide  40 mg Oral BID  . umeclidinium bromide  1 puff Inhalation Daily    PRN meds: acetaminophen, albuterol, diclofenac Sodium, guaiFENesin-dextromethorphan   Objective: Vitals:   09/26/19 0732 09/26/19 1320  BP:  (!) 133/107  Pulse:  98  Resp:  18  Temp:  98.2 F (36.8 C)  SpO2: 96% 100%    Intake/Output Summary (Last 24 hours) at 09/26/2019 1805 Last data filed at 09/26/2019 1300 Gross per 24 hour  Intake 960 ml  Output 2200 ml  Net -1240 ml   Filed Weights   09/23/19 0423 09/23/19 1840 09/24/19 0500  Weight: 122.4 kg 122.4 kg 122.4 kg   Weight change:  Body mass index is 32.85 kg/m.   Physical Exam: General exam: Appears calm and comfortable.  Obese. Skin: No rashes, lesions or ulcers. HEENT: Atraumatic, normocephalic, supple neck, no obvious bleeding Lungs: No respiratory distress, no cough, no wheezing.  CVS: Regular rate and rhythm. GI/Abd soft, distended from obesity, nontender, bowel sound present CNS: Alert, awake, oriented x3 Psychiatry: Guarded affect.  Extremities: No pedal edema, no knee effusion or erythema, no calf tenderness  Data Review: I have personally reviewed the laboratory data and studies available.  Recent Labs  Lab 09/20/19 2314 09/21/19 0513 09/22/19 0249 09/23/19 0214 09/24/19 0556  WBC 12.4* 9.7 10.6* 8.8 9.3  HGB 10.3* 10.1* 9.4* 8.8* 9.5*  HCT 35.4* 34.8* 31.9* 29.7* 32.3*  MCV 94.7 94.6 92.2 91.4 92.3  PLT 182 178 166 149* 161   Recent Labs  Lab 09/20/19 2314 09/21/19 0513 09/22/19 0249 09/23/19 0214 09/24/19 0556  NA 136 135 134* 133* 131*  K 4.1 4.4 4.5 4.1 4.3  CL 88* 87* 89* 88* 89*  CO2 35* 34* 34* 34* 31    GLUCOSE 175* 172* 165* 187* 158*  BUN 42* 41* 32* 32* 28*  CREATININE 1.31* 1.21 1.26* 1.20 1.26*  CALCIUM 8.4* 8.3* 8.3* 8.2* 8.3*  MG  --  2.1 2.1 2.0 2.0  PHOS  --  2.9  2.9 2.2*  --  2.9    Blain Pais, MD  [facility] 09/26/2019

## 2019-09-27 LAB — GLUCOSE, CAPILLARY
Glucose-Capillary: 134 mg/dL — ABNORMAL HIGH (ref 70–99)
Glucose-Capillary: 168 mg/dL — ABNORMAL HIGH (ref 70–99)
Glucose-Capillary: 155 mg/dL — ABNORMAL HIGH (ref 70–99)
Glucose-Capillary: 179 mg/dL — ABNORMAL HIGH (ref 70–99)

## 2019-09-27 MED ORDER — MELATONIN 5 MG PO TABS
5.0000 mg | ORAL_TABLET | Freq: Every evening | ORAL | Status: DC | PRN
  Administered 2019-09-27 – 2019-09-28 (×2): 5 mg via ORAL
  Filled 2019-09-27 (×2): qty 1

## 2019-09-27 MED ORDER — NON FORMULARY: 5.0000 mg | Freq: Every evening | Status: DC | PRN

## 2019-09-27 MED ORDER — LIP MEDEX EX OINT
1.0000 "application " | TOPICAL_OINTMENT | CUTANEOUS | Status: DC | PRN
  Filled 2019-09-27: qty 7

## 2019-09-27 MED ORDER — ALUM & MAG HYDROXIDE-SIMETH 200-200-20 MG/5ML PO SUSP
30.0000 mL | ORAL | Status: DC | PRN
  Filled 2019-09-27: qty 30

## 2019-09-27 MED ORDER — ONDANSETRON HCL 4 MG/2ML IJ SOLN
4.0000 mg | Freq: Four times a day (QID) | INTRAMUSCULAR | Status: DC | PRN
  Administered 2019-09-28: 14:00:00 4 mg via INTRAVENOUS
  Filled 2019-09-27: qty 2

## 2019-09-27 NOTE — Progress Notes (Signed)
Pt. seen for CPAP order,"want's to eat something yet before being placed on", RT to follow.

## 2019-09-27 NOTE — Progress Notes (Signed)
PROGRESS NOTE  [name]  DOB: [birth year]  PCP: [name], MD XT:[id number]  DOA: 09/20/2019  LOS: 6 days   Chief Complaint  Patient presents with  . Altered Mental Status   Brief narrative: Patient is a 78 yo M with a history of COPD on 6L [place], diastolic heart failure, DM2, CAD, morbid obesity and psych disorder (bipolar disorder). Patient was brought to the ED on 12/4 by EMS from home for weakness and low blood sugar.  Apparently, patient took 12 pills of hydrocodone with suicidal intent.  In the ED, he became lethargic.  Blood gas showed pH of 7.  He was given Narcan, started on BiPAP and admitted to critical care service.  Patient was also recently admitted on 11/19 appropriate overdose.  It was suspected to be suicidal at the time.  After psychiatry evaluation was obtained, he was determined to be not suicidal and was hence discharged home. 12/5, psychiatry consultation was obtained.  Recommended geriatric psychiatry admission.  He clinically improved and was transferred to medical service on 12/7.  Subjective: Patient was seen and examined earlier this afternoon.    Reports feeling better today. Asks if he could have a reevaluation by Psychiatry so he could perhaps be discharged home.   Assessment and plan: Acute intentional opioid overdose Suicidal attempt -UDS + for opiates, likely hydrocodone.  He acknowledged that he was depressed and trying to hurt himself. -Appreciate psychiatry evaluation.  Celexa continued. -Geriatric psychiatric inpatient therapy was recommended. -Social work consulted for inquiry on bed placement. -Place consult for Psychiatry again today as the patient reports feeling better and would consider going home if he could have Psychiatry follow up and therapy (perhaps telemedicine) while at home.   Acute on chronic respiratory failure with hypoxia -Chronically uses 6 L oxygen by nasal at home.  Required BiPAP because of narcotic overdose but  no longer requiting this for respiratory suppression but uses it for OSA while sleeping. -Currently on 5-6 lpm via [place]. -Medically stable for transfer to geriatric psychiatry unit -Continue bronchodilators.  Obstructive sleep apnea -Supposed to be on nocturnal BiPAP at home but he has poor compliance.  Diastolic heart failure:   -Currently not in acute exacerbation -Continue home lisinopril, torsemide, propranolol  Diabetes mellitus 2  -Home regimen include lantus 35 BID with meal time novolog.  CBG 165-175 since admission -Continue lantus at home dose and SSI QHS / AC -Continue carb modified diet  Constipation- Improved with bowel regimen  Osteoarthritis or multiple joints.  Had pain in his left knee but this improved with topical Voltaren and knee elastic bandage which will be continued as prn. Continue tylenol prn.   Medically stable for transfer to geriatric psychiatry unit  Mobility: Encourage ambulation  Diet: heart healthy/cardiac diet Fluid: None DVT prophylaxis:  Lovenox Code Status:  Full code Family Communication:  Not at bedside Expected Discharge:  Social actively working on finding bed for placement  Consultants:  Psychiatry 12/5, requested consultation again on 12/11  Procedures:   None Antimicrobials: Anti-infectives (From admission, onward)   Start     Dose/Rate Route Frequency Ordered Stop   09/21/19 0230  aztreonam (AZACTAM) 2 g in sodium chloride 0.9 % 100 mL IVPB  Status:  Discontinued     2 g 200 mL/hr over 30 Minutes Intravenous  Once 09/21/19 0215 09/21/19 0350   09/21/19 0230  vancomycin (VANCOCIN) 2,000 mg in sodium chloride 0.9 % 500 mL IVPB  Status:  Discontinued     2,000  mg 250 mL/hr over 120 Minutes Intravenous  Once 09/21/19 0221 09/21/19 0350        Code Status: Full Code   Diet Order            Diet heart healthy/carb modified Room service appropriate? Yes; Fluid consistency: Thin  Diet effective now               Infusions:    Scheduled Meds: . atorvastatin  40 mg Oral q1800  . chlorhexidine  15 mL Mouth Rinse BID  . Chlorhexidine Gluconate Cloth  6 each Topical Daily  . citalopram  20 mg Oral Daily  . enoxaparin (LOVENOX) injection  60 mg Subcutaneous Q24H  . gabapentin  400 mg Oral QID  . insulin aspart  0-15 Units Subcutaneous TID WC  . insulin aspart  0-5 Units Subcutaneous QHS  . insulin glargine  35 Units Subcutaneous BID  . lisinopril  2.5 mg Oral Daily  . mouth rinse  15 mL Mouth Rinse q12n4p  . pantoprazole  40 mg Oral BID  . propranolol  10 mg Oral Daily  . torsemide  40 mg Oral BID  . umeclidinium bromide  1 puff Inhalation Daily    PRN meds: acetaminophen, albuterol, alum & mag hydroxide-simeth, diclofenac Sodium, guaiFENesin-dextromethorphan, lip balm, ondansetron (ZOFRAN) IV   Objective: Vitals:   09/27/19 0603 09/27/19 1339  BP: 108/72 100/61  Pulse: 100 89  Resp: 16 16  Temp: 98.1 F (36.7 C) 98.1 F (36.7 C)  SpO2: 94% 97%    Intake/Output Summary (Last 24 hours) at 09/27/2019 1816 Last data filed at 09/27/2019 1740 Gross per 24 hour  Intake 840 ml  Output 1800 ml  Net -960 ml   Filed Weights   09/23/19 0423 09/23/19 1840 09/24/19 0500  Weight: 122.4 kg 122.4 kg 122.4 kg   Weight change:  Body mass index is 32.85 kg/m.   Physical Exam: General exam: Appears calm and comfortable.  Obese. Skin: No rashes, lesions or ulcers. HEENT: Atraumatic, normocephalic, supple neck, no obvious bleeding Lungs: No respiratory distress, no cough, no wheezing.  CVS: Regular rate and rhythm. GI/Abd soft, distended from obesity, nontender, bowel sound present CNS: Alert, awake, oriented x3 Psychiatry: Mood is reported as "good" with congruent affect.   Extremities: No pedal edema, no knee effusion or erythema, no calf tenderness  Data Review: I have personally reviewed the laboratory data and studies available.  Recent Labs  Lab 09/20/19 2314 09/21/19 0513  09/22/19 0249 09/23/19 0214 09/24/19 0556  WBC 12.4* 9.7 10.6* 8.8 9.3  HGB 10.3* 10.1* 9.4* 8.8* 9.5*  HCT 35.4* 34.8* 31.9* 29.7* 32.3*  MCV 94.7 94.6 92.2 91.4 92.3  PLT 182 178 166 149* 161   Recent Labs  Lab 09/20/19 2314 09/21/19 0513 09/22/19 0249 09/23/19 0214 09/24/19 0556  NA 136 135 134* 133* 131*  K 4.1 4.4 4.5 4.1 4.3  CL 88* 87* 89* 88* 89*  CO2 35* 34* 34* 34* 31  GLUCOSE 175* 172* 165* 187* 158*  BUN 42* 41* 32* 32* 28*  CREATININE 1.31* 1.21 1.26* 1.20 1.26*  CALCIUM 8.4* 8.3* 8.3* 8.2* 8.3*  MG  --  2.1 2.1 2.0 2.0  PHOS  --  2.9  2.9 2.2*  --  2.9    Blain Pais, MD  [facility] 09/27/2019

## 2019-09-27 NOTE — [facility] Counselor (Signed)
TTS received phone call from Dr. Hayes Ludwig regarding patient's psych consult. She states consult was placed 3 hours ago. This counselor contacted Waylan Boga, [place]. Per provider consults for patients placed after 12 pm are not seen until the following day.  TTS just attempted to reach Dr. Hayes Ludwig at [phone] to give her update. Unable to to reach MD at this time but will attempt again this afternoon.

## 2019-09-28 DIAGNOSIS — Z630 Problems in relationship with spouse or partner: Secondary | ICD-10-CM

## 2019-09-28 DIAGNOSIS — T1491XD Suicide attempt, subsequent encounter: Secondary | ICD-10-CM

## 2019-09-28 DIAGNOSIS — T402X2D Poisoning by other opioids, intentional self-harm, subsequent encounter: Secondary | ICD-10-CM

## 2019-09-28 LAB — GLUCOSE, CAPILLARY
Glucose-Capillary: 135 mg/dL — ABNORMAL HIGH (ref 70–99)
Glucose-Capillary: 175 mg/dL — ABNORMAL HIGH (ref 70–99)
Glucose-Capillary: 152 mg/dL — ABNORMAL HIGH (ref 70–99)
Glucose-Capillary: 197 mg/dL — ABNORMAL HIGH (ref 70–99)
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
## 2019-09-28 NOTE — Progress Notes (Signed)
Patient refuses to wear bipap for tonight. RT encouraged patient to call if he changes his mind.

## 2019-09-28 NOTE — Progress Notes (Signed)
PROGRESS NOTE  [name]  DOB: 04/22/1941  PCP: [name], MD XT:[id number]  DOA: 09/20/2019  LOS: 7 days   Chief Complaint  Patient presents with  . Altered Mental Status   Brief narrative: Patient is a 78 yo M with a history of COPD on 6L [place], diastolic heart failure, DM2, CAD, morbid obesity and psych disorder (bipolar disorder). Patient was brought to the ED on 12/4 by EMS from home for weakness and low blood sugar.  Apparently, patient took 12 pills of hydrocodone with suicidal intent.  In the ED, he became lethargic.  Blood gas showed pH of 7.  He was given Narcan, started on BiPAP and admitted to critical care service.  Patient was also recently admitted on 11/19 appropriate overdose.  It was suspected to be suicidal at the time.  After psychiatry evaluation was obtained, he was determined to be not suicidal and was hence discharged home. 12/5, psychiatry consultation was obtained.  Recommended geriatric psychiatry admission.  He clinically improved and was transferred to medical service on 12/7.  Subjective: Patient was seen and examined earlier this afternoon.    He had nausea but no vomiting. He felt better overall, however, and still wanted to go home once he started feeling better.   Assessment and plan: Acute intentional opioid overdose Suicidal attempt -UDS + for opiates, likely hydrocodone.  He acknowledged that he was depressed and trying to hurt himself. -Appreciate psychiatry evaluation.  Celexa continued. -Geriatric psychiatric inpatient therapy was recommended. -Social work consulted for inquiry on bed placement. -Psychiatry consulted again, recommending continuation of his current medications and outpatient follow up with LCSW consultation.   Acute on chronic respiratory failure with hypoxia -Chronically uses 5-6 L oxygen by nasal at home.  Required BiPAP because of narcotic overdose but no longer requiting this for respiratory suppression but uses  it for OSA while sleeping. -Currently on 5-6 lpm via [place]. -Continue bronchodilators.  Obstructive sleep apnea -Supposed to be on nocturnal BiPAP at home but he has poor compliance.  Diastolic heart failure:   -Currently not in acute exacerbation -Continue home lisinopril, torsemide, propranolol  Diabetes mellitus 2  -Home regimen include lantus 35 BID with meal time novolog.  CBG 165-175 since admission -Continue lantus at home dose and SSI QHS / AC -Continue carb modified diet  Constipation- Improved with bowel regimen  Osteoarthritis or multiple joints.  Had pain in his left knee but this improved with topical Voltaren and knee elastic bandage which will be continued as prn. Continue tylenol prn.   Nausea.  Of unclear etiology. Denies abdominal pain. Will continue antiemetics and monitor.    Mobility: Encourage ambulation  Diet: heart healthy/cardiac diet Fluid: None DVT prophylaxis:  Lovenox Code Status:  Full code Family Communication:  Not at bedside Expected Discharge:  If nausea improving may be ready for dc to home in 24 hours.   Consultants:  Psychiatry 12/5, requested consultation again on 12/12  Procedures:   None Antimicrobials: Anti-infectives (From admission, onward)   Start     Dose/Rate Route Frequency Ordered Stop   09/21/19 0230  aztreonam (AZACTAM) 2 g in sodium chloride 0.9 % 100 mL IVPB  Status:  Discontinued     2 g 200 mL/hr over 30 Minutes Intravenous  Once 09/21/19 0215 09/21/19 0350   09/21/19 0230  vancomycin (VANCOCIN) 2,000 mg in sodium chloride 0.9 % 500 mL IVPB  Status:  Discontinued     2,000 mg 250 mL/hr over 120 Minutes Intravenous  Once 09/21/19 0221 09/21/19 0350        Code Status: Full Code   Diet Order            Diet heart healthy/carb modified Room service appropriate? Yes; Fluid consistency: Thin  Diet effective now              Infusions:    Scheduled Meds: . atorvastatin  40 mg Oral q1800  .  chlorhexidine  15 mL Mouth Rinse BID  . Chlorhexidine Gluconate Cloth  6 each Topical Daily  . citalopram  20 mg Oral Daily  . enoxaparin (LOVENOX) injection  60 mg Subcutaneous Q24H  . gabapentin  400 mg Oral QID  . insulin aspart  0-15 Units Subcutaneous TID WC  . insulin aspart  0-5 Units Subcutaneous QHS  . insulin glargine  35 Units Subcutaneous BID  . lisinopril  2.5 mg Oral Daily  . mouth rinse  15 mL Mouth Rinse q12n4p  . pantoprazole  40 mg Oral BID  . propranolol  10 mg Oral Daily  . torsemide  40 mg Oral BID  . umeclidinium bromide  1 puff Inhalation Daily    PRN meds: acetaminophen, albuterol, alum & mag hydroxide-simeth, diclofenac Sodium, guaiFENesin-dextromethorphan, lip balm, Melatonin, ondansetron (ZOFRAN) IV   Objective: Vitals:   09/28/19 0859 09/28/19 1331  BP:  115/68  Pulse:  84  Resp:  20  Temp:  97.7 F (36.5 C)  SpO2: 97% 95%    Intake/Output Summary (Last 24 hours) at 09/28/2019 1625 Last data filed at 09/28/2019 1555 Gross per 24 hour  Intake 1920 ml  Output 2701 ml  Net -781 ml   Filed Weights   09/23/19 0423 09/23/19 1840 09/24/19 0500  Weight: 122.4 kg 122.4 kg 122.4 kg   Weight change:  Body mass index is 32.85 kg/m.   Physical Exam: General exam: Appears calm and comfortable.  Obese. Skin: No rashes, lesions or ulcers. HEENT: Atraumatic, normocephalic, supple neck, no obvious bleeding Lungs: No respiratory distress, no cough, no wheezing.  CVS: Regular rate and rhythm. GI/Abd soft, distended from obesity, nontender, bowel sound present CNS: Alert, awake, oriented x3 Psychiatry: Mood and affect normal.   Extremities: No pedal edema, no knee effusion or erythema, no calf tenderness  Data Review: I have personally reviewed the laboratory data and studies available.  Recent Labs  Lab 09/22/19 0249 09/23/19 0214 09/24/19 0556  WBC 10.6* 8.8 9.3  HGB 9.4* 8.8* 9.5*  HCT 31.9* 29.7* 32.3*  MCV 92.2 91.4 92.3  PLT 166 149* 161    Recent Labs  Lab 09/22/19 0249 09/23/19 0214 09/24/19 0556  NA 134* 133* 131*  K 4.5 4.1 4.3  CL 89* 88* 89*  CO2 34* 34* 31  GLUCOSE 165* 187* 158*  BUN 32* 32* 28*  CREATININE 1.26* 1.20 1.26*  CALCIUM 8.3* 8.2* 8.3*  MG 2.1 2.0 2.0  PHOS 2.2*  --  2.9    Blain Pais, MD  [facility] 09/28/2019

## 2019-09-28 NOTE — Progress Notes (Signed)
Pt. seen again for CPAP placement, not ready at this time, RN made aware, RT to monitor.

## 2019-09-29 LAB — GLUCOSE, CAPILLARY
Glucose-Capillary: 121 mg/dL — ABNORMAL HIGH (ref 70–99)
Glucose-Capillary: 180 mg/dL — ABNORMAL HIGH (ref 70–99)
Glucose-Capillary: 186 mg/dL — ABNORMAL HIGH (ref 70–99)

## 2019-09-29 MED ORDER — DICLOFENAC SODIUM 1 % EX GEL: 4.0000 g | Freq: Two times a day (BID) | CUTANEOUS | 0 refills | Status: DC | PRN

## 2019-09-29 NOTE — Discharge Summary (Signed)
Physician Discharge Summary  [name] [id number] DOB: 1941-02-02 DOA: 09/20/2019  PCP: Howard Ebbs, MD  Admit date: 09/20/2019 Discharge date: 09/29/2019  Admitted From: Home Disposition: Home  Recommendations for Outpatient Follow-up:  1. Follow up with PCP in 1-2 weeks 2.         Follow up with Psychiatry in 3-4 days.   Home Health: No Equipment/Devices: Patient given portable oxygen tank for discharge to home.   Discharge Condition:Stable CODE STATUS: Full  Diet recommendation: Heart Healthy    Brief/Interim Summary:  Acute intentional opioid overdose Suicidal attempt -UDS + for opiates, likely hydrocodone. He acknowledged that he was depressed and trying to hurt himself. -Appreciate psychiatry evaluation. Celexa continued. -Geriatric psychiatric inpatient therapy was recommended but patient improved and Psychiatry recommended discharge to home with outpatient Psychiatry follow up. -Social work consulted and assisted patient in finding outpatient Psychiatrist.   Acute on chronic respiratory failure with hypoxia. With no exacerbation.  -Chronically uses 5-6 L oxygen by nasal at home.  Required BiPAP because of narcotic overdose but no longer requiting this for respiratory suppression but uses it for OSA while sleeping. -Currently on 5-6 lpm via [place]. -Continue bronchodilators. -He will resume his home regimen.   Obstructive sleep apnea -Supposed to be on nocturnal BiPAP at home but he has poor compliance. -Patient encouraged to use his BiPAP at home.   Diastolic heart failure:  -Currently not in acute exacerbation -Continue home lisinopril, torsemide, propranolol  Diabetes mellitus 2  -Home regimen include lantus 35 BID with meal timenovolog. CBG 165-175 since admission -He will resume his home regimen upon discharge.   Constipation- Improved with bowel regimen  Osteoarthritis or multiple joints.  Had pain in his left knee but this improved  with topical Voltaren and knee elastic bandage which will be continued as prn. Continue tylenol prn.   Nausea.  Of unclear etiology. Denies abdominal pain. This resolved prior to his discharge.   Disposition Plan:   Home with PCP and Psychiatry follow up   Consultants:   Psychiatry. Please below for their note on the date prior to his discharge:  "Treatment Plan Summary: 78 year old male with history of Bipolar disorder-mixed who was admitted to ICU over a week ago after he overdosed on Hydrocodone trying to get his wife attention. Today, patient denies depression, psychosis, delusions, self harming thoughts, declared that he has mended fences with wife and ready to go home and be with her. Patient is psychiatrically cleared. Recommendations: -Continues Celexa 20 mg daily for depression. -Continue Gabapentin 400 mg twice daily for mood -Consider Social worker consult to facilitate patient referral to an outpatient psychiatrist upon discharge. admission.  Disposition: No evidence of imminent risk to self or others at present.   Patient does not meet criteria for psychiatric inpatient admission. Supportive therapy provided about ongoing stressors. Psychiatric service siging out. Re-consult as needed  This service was provided via telemedicine using a 2-way, interactive audio and video technology.  Names of all persons participating in this telemedicine service and their role in this encounter. Name: [name] Role: Patient  Name: [name] Role: RN  Name: [name] Role: Psychiatrist  Name:  Role:     Howard Pilgrim, MD 09/28/2019 2:36 PM"  Procedures:  None Antimicrobials:  Aztreonam 12/05>>12/05 IV Vancomycin 12/05>>12/05 Subjective: Patient tolerating a diet well with no nausea or vomiting. His left knee pain has improved with Voltaren gel. He denies SI or HI.     Discharge Diagnoses:  Principal  Problem:   Bipolar 1 disorder, mixed,  moderate (HCC) Active Problems:   Overdose    Discharge Instructions  Discharge Instructions    Diet - low sodium heart healthy   Complete by: As directed    Increase activity slowly   Complete by: As directed      Allergies as of 09/29/2019      Reactions   Morphine And Related Shortness Of Breath, Nausea And Vomiting, Rash, Other (See Comments)   Reaction:  Hallucinations    Penicillins Anaphylaxis, Hives, Other (See Comments)   10/02/18 - discussed with patient and he states he can tolerate penicillin capsules and had some hives when he was 78yo and received pcn injections Has patient had a PCN reaction causing immediate rash, facial/tongue/throat swelling, SOB or lightheadedness with hypotension: Yes Has patient had a PCN reaction causing severe rash involving mucus membranes or skin necrosis: No Has patient had a PCN reaction that required hospitalization No Has patient had a PCN reaction occurring within the last 10 y   Zolpidem Shortness Of Breath   Demerol [meperidine] Other (See Comments)   Reaction:  Hallucinations     Dilaudid [hydromorphone Hcl] Other (See Comments)   Reaction:  Hallucinations    Levofloxacin Other (See Comments)   Reaction:  Unknown       Medication List    STOP taking these medications   ALPRAZolam 0.25 MG tablet Commonly known as: XANAX     TAKE these medications   acetaminophen 325 MG tablet Commonly known as: TYLENOL Take 650 mg by mouth every 6 (six) hours as needed for mild pain.   albuterol 108 (90 Base) MCG/ACT inhaler Commonly known as: VENTOLIN HFA Inhale 1-2 puffs into the lungs every 6 (six) hours as needed for wheezing or shortness of breath.   atorvastatin 40 MG tablet Commonly known as: LIPITOR Take 1 tablet (40 mg total) by mouth daily at 6 PM.   benzonatate 100 MG capsule Commonly known as: TESSALON Take 100 mg by mouth every 8 (eight) hours as needed for cough or congestion.   citalopram 40 MG tablet Commonly  known as: CELEXA Take 0.5 tablets (20 mg total) by mouth daily.   [facility] Melatonin 5 MG Tabs Generic drug: Melatonin Take 5 mg by mouth daily as needed. For sleep   diclofenac Sodium 1 % Gel Commonly known as: VOLTAREN Apply 4 g topically 2 (two) times daily as needed (apply to knees as needed for pain).   fluticasone 50 MCG/ACT nasal spray Commonly known as: FLONASE Place 2 sprays into both nostrils daily. What changed:   when to take this  reasons to take this   gabapentin 800 MG tablet Commonly known as: NEURONTIN Take 400 mg by mouth 4 (four) times daily.   insulin aspart 100 UNIT/ML injection Commonly known as: novoLOG Inject 25 Units into the skin 3 (three) times daily with meals.   insulin glargine 100 UNIT/ML injection Commonly known as: LANTUS Inject 0.35 mLs (35 Units total) into the skin 2 (two) times daily.   lisinopril 2.5 MG tablet Commonly known as: ZESTRIL Take 1 tablet (2.5 mg total) by mouth daily.   pantoprazole 40 MG tablet Commonly known as: PROTONIX Take 1 tablet (40 mg total) by mouth 2 (two) times daily.   propranolol 10 MG tablet Commonly known as: INDERAL Take 10 mg by mouth daily.   tiotropium 18 MCG inhalation capsule Commonly known as: SPIRIVA Place 18 mcg into inhaler and inhale daily.   torsemide 20  MG tablet Commonly known as: DEMADEX Take 2 tablets (40 mg total) by mouth 2 (two) times daily.      Follow-up Information    Howard Ebbs, MD Follow up in 1 week(s).   Specialty: Internal Medicine Contact information: [place] [address] [phone]          Allergies  Allergen Reactions  . Morphine And Related Shortness Of Breath, Nausea And Vomiting, Rash and Other (See Comments)    Reaction:  Hallucinations   . Penicillins Anaphylaxis, Hives and Other (See Comments)    10/02/18 - discussed with patient and he states he can tolerate penicillin capsules and had some hives when he was 78yo and  received pcn injections  Has patient had a PCN reaction causing immediate rash, facial/tongue/throat swelling, SOB or lightheadedness with hypotension: Yes Has patient had a PCN reaction causing severe rash involving mucus membranes or skin necrosis: No Has patient had a PCN reaction that required hospitalization No Has patient had a PCN reaction occurring within the last 10 y  . Zolpidem Shortness Of Breath  . Demerol [Meperidine] Other (See Comments)    Reaction:  Hallucinations    . Dilaudid [Hydromorphone Hcl] Other (See Comments)    Reaction:  Hallucinations   . Levofloxacin Other (See Comments)    Reaction:  Unknown      Procedures/Studies: CT Head Wo Contrast  Result Date: 09/14/2019 CLINICAL DATA:  Attempted suicide, ingestion EXAM: CT HEAD WITHOUT CONTRAST TECHNIQUE: Contiguous axial images were obtained from the base of the skull through the vertex without intravenous contrast. COMPARISON:  MRI 05/05/2019, CT 05/05/2019 FINDINGS: Brain: No evidence of acute infarction, hemorrhage, hydrocephalus, extra-axial collection or mass lesion/mass effect. Symmetric prominence of the ventricles, cisterns and sulci compatible with parenchymal volume loss. Patchy areas of white matter hypoattenuation are most compatible with chronic microvascular angiopathy. Vascular: Atherosclerotic calcification of the carotid siphons and intradural vertebral arteries. No hyperdense vessel. Skull: No calvarial fracture or suspicious osseous lesion. No scalp swelling or hematoma. Sinuses/Orbits: Scattered mural thickening in the ethmoids. No air-fluid levels. Remaining paranasal sinuses are predominantly clear. Left mastoid effusion. No right mastoid effusion. Included orbital structures are unremarkable aside from right lens extraction. Other: None IMPRESSION: No acute intracranial abnormality. Stable moderate parenchymal volume loss and chronic white matter angiopathy. Intracranial atherosclerosis. Left mastoid  effusion. Electronically Signed   By: Lovena Le M.D.   On: 09/14/2019 02:09   CT Angio Chest PE W and/or Wo Contrast  Result Date: 09/13/2019 CLINICAL DATA:  Shortness of breath with sharp chest pain for 5 hours EXAM: CT ANGIOGRAPHY CHEST WITH CONTRAST TECHNIQUE: Multidetector CT imaging of the chest was performed using the standard protocol during bolus administration of intravenous contrast. Multiplanar CT image reconstructions and MIPs were obtained to evaluate the vascular anatomy. CONTRAST:  39mL OMNIPAQUE IOHEXOL 350 MG/ML SOLN COMPARISON:  CTA chest 03/30/2019 FINDINGS: Cardiovascular: Satisfactory opacification the pulmonary arteries to the segmental level. No pulmonary artery filling defects are identified. Central pulmonary arteries are normal caliber. No elevation of the RV/LV ratio (0.8). Cardiac size at the upper limits normal. Postsurgical changes from prior CABG. Calcification of the native coronary arteries is noted. No pericardial effusion. Calcifications of the normal caliber thoracic aorta. Shared origin of the brachiocephalic and left common carotid artery. There is atheromatous plaque within the proximal great vessels. Mediastinum/Nodes: There is a large hiatal hernia containing much of the proximal stomach and mesentery. No mediastinal fluid or gas. Mildly nodular thyroid gland, similar to comparison.  No acute abnormality of the trachea or esophagus. No mediastinal, hilar or axillary adenopathy. Lungs/Pleura: Centrilobular and paraseptal emphysema with a large left apical bulla. Bandlike area of scarring seen along the left major fissure. Additional architectural distortion in the lingula. Additional scarring or atelectasis seen in both lung bases. No consolidation, features of edema, pneumothorax, or effusion. Upper Abdomen: Hiatal hernia, as above Scattered colonic diverticula without focal pericolonic inflammation to suggest diverticulitis. No acute abnormalities present in the  visualized portions of the upper abdomen. Musculoskeletal: Multilevel degenerative changes are present in the imaged portions of the spine. Post sternotomy changes are noted. No acute osseous abnormality or suspicious osseous lesion. Review of the MIP images confirms the above findings. IMPRESSION: 1. No acute or chronic pulmonary emboli visualized. 2. Large hiatal hernia containing much of the proximal stomach and adjacent mesentery. 3. Emphysema with bandlike areas of scarring and left apical bullous disease. 4. Emphysema (ICD10-J43.9). 5. Aortic Atherosclerosis (ICD10-I70.0) 6. Coronary artery disease.  Prior CABG. 7. Colonic diverticulosis. Electronically Signed   By: Lovena Le M.D.   On: 09/13/2019 04:20   DG Chest Portable 1 View  Result Date: 09/21/2019 CLINICAL DATA:  Patient came by [facility] from home. Patient called EMS for weakness. Patient sugar was 54. Patient does not keep track of sugars or insulin. Patient bs- 220. While en route patient was harder to arouse and became lethargic. PT HX: e.*comment was truncated*sobetrc EXAM: PORTABLE CHEST 1 VIEW COMPARISON:  None. FINDINGS: Sternotomy wires overlie normal cardiac silhouette. There is mild bibasilar atelectasis. No effusion infiltrate identified. No pneumothorax. IMPRESSION: 1. No acute cardiopulmonary process. 2. Bibasilar atelectasis. Electronically Signed   By: Suzy Bouchard M.D.   On: 09/21/2019 04:32   DG Chest Portable 1 View  Result Date: 09/14/2019 CLINICAL DATA:  Shortness of breath began 5 hours prior, sharp pressure, pain radiating to chest EXAM: PORTABLE CHEST 1 VIEW COMPARISON:  CTA 09/13/2019 FINDINGS: Emphysematous changes and architectural distortion in the lung bases, similar to comparison imaging from 1 day prior. Some bandlike areas of opacity are compatible with additional atelectasis. No pneumothorax. No visible effusion. Cardiomegaly and a large hiatal hernia are unchanged from comparison. Postsurgical changes  related to prior CABG including intact and aligned sternotomy wires and multiple surgical clips projecting over the mediastinum. No acute osseous or soft tissue abnormality. IMPRESSION: 1. Basilar atelectasis on a background of emphysema and scarring, not significantly changed from 1 day prior 2. Stable cardiomegaly with large hiatal hernia. Electronically Signed   By: Lovena Le M.D.   On: 09/14/2019 01:53   DG Chest Portable 1 View  Result Date: 09/13/2019 CLINICAL DATA:  Shortness of breath EXAM: PORTABLE CHEST 1 VIEW COMPARISON:  09/06/2019 FINDINGS: Moderate cardiomegaly with bilateral basilar predominant interstitial opacity. Remote median sternotomy. Small left pleural effusion. IMPRESSION: Cardiomegaly with small left pleural effusion and basilar predominant interstitial opacities that may indicate early pulmonary edema. Electronically Signed   By: Ulyses Jarred M.D.   On: 09/13/2019 02:43   DG Chest Port 1 View  Result Date: 09/06/2019 CLINICAL DATA:  Pulmonary disease EXAM: PORTABLE CHEST 1 VIEW COMPARISON:  Yesterday FINDINGS: Chronic cardiomegaly. Hiatal hernia. Stable mild interstitial coarsening without Kerley lines. No air bronchogram. There is a bleb at the left apex. Prior CABG. IMPRESSION: Stable interstitial coarsening. Cardiomegaly and hiatal hernia. Electronically Signed   By: Monte Fantasia M.D.   On: 09/06/2019 06:21   DG Chest Portable 1 View  Result Date: 09/05/2019 CLINICAL DATA:  Dyspnea and shortness  of breath for EXAM: PORTABLE CHEST 1 VIEW COMPARISON:  August 13, 2019 FINDINGS: Again noted is mild cardiomegaly. There is prominence to the central pulmonary vasculature. Hazy airspace opacity seen at the left lower lung not significantly changed since the prior exam. There is also blunting of the left costophrenic angle, likely small effusion. Overlying median sternotomy wires. IMPRESSION: Pulmonary vascular congestion and hazy airspace opacity at the left lung base  which could be layering effusion and/or early infectious etiology. This is not significantly changed since the prior exam. Trace left pleural effusion. Electronically Signed   By: Prudencio Pair M.D.   On: 09/05/2019 10:16    (Echo, Carotid, EGD, Colonoscopy, ERCP)    Subjective:   Discharge Exam: Vitals:   09/29/19 0818 09/29/19 1403  BP:  (!) 95/58  Pulse:  91  Resp:  18  Temp:  98.4 F (36.9 C)  SpO2: 96% 98%   Vitals:   09/28/19 2042 09/29/19 0612 09/29/19 0818 09/29/19 1403  BP: 106/68 (!) 90/36  (!) 95/58  Pulse: 90 84  91  Resp: 18 17  18   Temp: 97.7 F (36.5 C) 98 F (36.7 C)  98.4 F (36.9 C)  TempSrc: Oral Oral  Oral  SpO2: 91% 99% 96% 98%  Weight:      Height:       General exam: NAD.  Obese. Skin: No rashes, lesions or ulcers. HEENT: Atraumatic, normocephalic, supple neck, no obvious bleeding Lungs: No respiratory distress, no cough, no wheezing.  CVS: Regular rate and rhythm. GI/Abd soft, distended from obesity, nontender, bowel sound present CNS: Alert, awake, oriented x3 Psychiatry:  Irritable and congruent affect.    Extremities: No pedal edema, no knee effusion or erythema, no calf tenderness   The results of significant diagnostics from this hospitalization (including imaging, microbiology, ancillary and laboratory) are listed below for reference.     Microbiology: Recent Results (from the past 240 hour(s))  SARS CORONAVIRUS 2 (TAT 6-24 HRS) Nasopharyngeal Nasopharyngeal Swab     Status: None   Collection Time: 09/21/19  1:27 AM   Specimen: Nasopharyngeal Swab  Result Value Ref Range Status   SARS Coronavirus 2 NEGATIVE NEGATIVE Final    Comment: (NOTE) SARS-CoV-2 target nucleic acids are NOT DETECTED. The SARS-CoV-2 RNA is generally detectable in upper and lower respiratory specimens during the acute phase of infection. Negative results do not preclude SARS-CoV-2 infection, do not rule out co-infections with other pathogens, and should not  be used as the sole basis for treatment or other patient management decisions. Negative results must be combined with clinical observations, patient history, and epidemiological information. The expected result is Negative. Fact Sheet for Patients: SugarRoll.be Fact Sheet for Healthcare Providers: https://www.woods-mathews.com/ This test is not yet approved or cleared by the [place] FDA and  has been authorized for detection and/or diagnosis of SARS-CoV-2 by FDA under an Emergency Use Authorization (EUA). This EUA will remain  in effect (meaning this test can be used) for the duration of the COVID-19 declaration under Section 56 4(b)(1) of the Act, 21 U.S.C. section 360bbb-3(b)(1), unless the authorization is terminated or revoked sooner. Performed at [facility], [place] [address] [place], [place] [address]   Blood culture (routine x 2)     Status: None   Collection Time: 09/21/19  2:49 AM   Specimen: BLOOD RIGHT HAND  Result Value Ref Range Status   Specimen Description   Final    BLOOD RIGHT HAND Performed at [facility], [place] [address], [place], [place] [address]    Special Requests   Final    BOTTLES DRAWN AEROBIC ONLY Blood Culture adequate volume Performed at [place] [address], [place], [place] [address]    Culture   Final    NO GROWTH 5 DAYS Performed at [facility], [place] [address] [place], [place] [address]    Report Status 09/26/2019 FINAL  Final  Blood culture (routine x 2)     Status: None   Collection Time: 09/21/19  5:12 AM   Specimen: BLOOD RIGHT HAND  Result Value Ref Range Status   Specimen Description   Final    BLOOD RIGHT HAND Performed at [place] [address] [place], [place] [address]    Special Requests   Final    BOTTLES DRAWN AEROBIC ONLY Blood Culture results may not be optimal due to an inadequate volume of  blood received in culture bottles Performed at [place] [facility] [name], [place], [place] [address]    Culture   Final    NO GROWTH 5 DAYS Performed at [facility], [place] [address], [place], [place] [address]    Report Status 09/26/2019 FINAL  Final  MRSA PCR Screening     Status: None   Collection Time: 09/21/19  5:53 AM   Specimen: Nasal Mucosa; Nasopharyngeal  Result Value Ref Range Status   MRSA by PCR NEGATIVE NEGATIVE Final    Comment:        The GeneXpert MRSA Assay (FDA approved for NASAL specimens only), is one component of a comprehensive MRSA colonization surveillance program. It is not intended to diagnose MRSA infection nor to guide or monitor treatment for MRSA infections. Performed at [facility], [place] [address], [place], [place] [address]      Labs: BNP (last 3 results) Recent Labs    08/13/19 1010 09/05/19 0938 09/13/19 0146  BNP 99.0 373.0* [id number]*   Basic Metabolic Panel: Recent Labs  Lab 09/23/19 0214 09/24/19 0556  NA 133* 131*  K 4.1 4.3  CL 88* 89*  CO2 34* 31  GLUCOSE 187* 158*  BUN 32* 28*  CREATININE 1.20 1.26*  CALCIUM 8.2* 8.3*  MG 2.0 2.0  PHOS  --  2.9   Liver Function Tests: No results for input(s): AST, ALT, ALKPHOS, BILITOT, PROT, ALBUMIN in the last 168 hours. No results for input(s): LIPASE, AMYLASE in the last 168 hours. No results for input(s): AMMONIA in the last 168 hours. CBC: Recent Labs  Lab 09/23/19 0214 09/24/19 0556  WBC 8.8 9.3  HGB 8.8* 9.5*  HCT 29.7* 32.3*  MCV 91.4 92.3  PLT 149* 161   Cardiac Enzymes: No results for input(s): CKTOTAL, CKMB, CKMBINDEX, TROPONINI in the last 168 hours. BNP: Invalid input(s): POCBNP CBG: Recent Labs  Lab 09/28/19 1124 09/28/19 1656 09/28/19 2052 09/29/19 0742 09/29/19 1131  GLUCAP 175* 152* 197* 121* 180*   D-Dimer No results for input(s): DDIMER in the last 72 hours. Hgb A1c No results for input(s):  HGBA1C in the last 72 hours. Lipid Profile No results for input(s): CHOL, HDL, LDLCALC, TRIG, CHOLHDL, LDLDIRECT in the last 72 hours. Thyroid function studies No results for input(s): TSH, T4TOTAL, T3FREE, THYROIDAB in the last 72 hours.  Invalid input(s): FREET3 Anemia work up No results for input(s): VITAMINB12, FOLATE, FERRITIN, TIBC, IRON, RETICCTPCT in the last 72 hours. Urinalysis    Component Value Date/Time  COLORURINE YELLOW 09/21/2019 0127   APPEARANCEUR CLEAR 09/21/2019 0127   LABSPEC 1.010 09/21/2019 0127   PHURINE 5.0 09/21/2019 0127   GLUCOSEU NEGATIVE 09/21/2019 0127   HGBUR MODERATE (A) 09/21/2019 0127   BILIRUBINUR NEGATIVE 09/21/2019 0127   BILIRUBINUR small 02/13/2015 1354   KETONESUR NEGATIVE 09/21/2019 0127   PROTEINUR NEGATIVE 09/21/2019 0127   UROBILINOGEN 0.2 02/13/2015 1354   UROBILINOGEN 0.2 08/24/2014 1948   NITRITE NEGATIVE 09/21/2019 0127   LEUKOCYTESUR NEGATIVE 09/21/2019 0127   Sepsis Labs Invalid input(s): PROCALCITONIN,  WBC,  LACTICIDVEN Microbiology Recent Results (from the past 240 hour(s))  SARS CORONAVIRUS 2 (TAT 6-24 HRS) Nasopharyngeal Nasopharyngeal Swab     Status: None   Collection Time: 09/21/19  1:27 AM   Specimen: Nasopharyngeal Swab  Result Value Ref Range Status   SARS Coronavirus 2 NEGATIVE NEGATIVE Final    Comment: (NOTE) SARS-CoV-2 target nucleic acids are NOT DETECTED. The SARS-CoV-2 RNA is generally detectable in upper and lower respiratory specimens during the acute phase of infection. Negative results do not preclude SARS-CoV-2 infection, do not rule out co-infections with other pathogens, and should not be used as the sole basis for treatment or other patient management decisions. Negative results must be combined with clinical observations, patient history, and epidemiological information. The expected result is Negative. Fact Sheet for Patients: SugarRoll.be Fact Sheet for  Healthcare Providers: https://www.woods-mathews.com/ This test is not yet approved or cleared by the [place] FDA and  has been authorized for detection and/or diagnosis of SARS-CoV-2 by FDA under an Emergency Use Authorization (EUA). This EUA will remain  in effect (meaning this test can be used) for the duration of the COVID-19 declaration under Section 56 4(b)(1) of the Act, 21 U.S.C. section 360bbb-3(b)(1), unless the authorization is terminated or revoked sooner. Performed at [facility], [place] [address], [place], [place] [address]   Blood culture (routine x 2)     Status: None   Collection Time: 09/21/19  2:49 AM   Specimen: BLOOD RIGHT HAND  Result Value Ref Range Status   Specimen Description   Final    BLOOD RIGHT HAND Performed at [facility] [address] [place], [place] [address]    Special Requests   Final    BOTTLES DRAWN AEROBIC ONLY Blood Culture adequate volume Performed at [facility] [address], [place], [place] [address]    Culture   Final    NO GROWTH 5 DAYS Performed at [facility], [place] [address], [place], [place] [address]    Report Status 09/26/2019 FINAL  Final  Blood culture (routine x 2)     Status: None   Collection Time: 09/21/19  5:12 AM   Specimen: BLOOD RIGHT HAND  Result Value Ref Range Status   Specimen Description   Final    BLOOD RIGHT HAND Performed at [facility] [address], [place], [place] [address]    Special Requests   Final    BOTTLES DRAWN AEROBIC ONLY Blood Culture results may not be optimal due to an inadequate volume of blood received in culture bottles Performed at [facility] [address], [place], [place] [address]    Culture   Final    NO GROWTH 5 DAYS Performed at [facility], [place] [address] [place],  [address]    Report Status 09/26/2019 FINAL  Final  MRSA PCR Screening      Status: None  Collection Time: 09/21/19  5:53 AM   Specimen: Nasal Mucosa; Nasopharyngeal  Result Value Ref Range Status   MRSA by PCR NEGATIVE NEGATIVE Final    Comment:        The GeneXpert MRSA Assay (FDA approved for NASAL specimens only), is one component of a comprehensive MRSA colonization surveillance program. It is not intended to diagnose MRSA infection nor to guide or monitor treatment for MRSA infections. Performed at [facility], [place] [address] [place], [place] [address]      Time coordinating discharge: Over 33 minutes  SIGNED:   Blain Pais, MD  [facility] 09/29/2019, 2:31 PM   If 7PM-7AM, please contact night-coverage [email or web address] Password [email or web address]

## 2019-09-29 NOTE — TOC Initial Note (Signed)
Transition of Care [facility]) - Initial/Assessment Note    Patient Details  Name: [name] MRN: [id number] Date of Birth: Feb 25, 1941  Transition of Care [facility]) CM/SW Contact:    Servando Snare, LCSW Phone Number: 09/29/2019, 10:56 AM  Clinical Narrative:    LCSW consulted for outpatient psych resources. LCSW met with patient at bedside. Patient appeared to be irritable. LCSW explained role and reason for visit. Patient states " Oh the person I can never get in touch with" LCSW explained referral for outpatient psych resources. Patient states " A nut doctor". LCSW explained reason for referral and how to follow up for appointment by contacting insurance to confirm provider coverage.   Patient reports he has never seen a Psychiatrist or received therapy in the community. He reports that he has only seen a Psychiatrist durring his current hospital stay.                 Expected Discharge Plan: Home/Self Care Barriers to Discharge: No Barriers Identified   Patient Goals and CMS Choice Patient states their goals for this hospitalization and ongoing recovery are:: Patient uncooperative. Did not state gaols.   Choice offered to / list presented to : NA  Expected Discharge Plan and Services Expected Discharge Plan: Home/Self Care In-house Referral: NA Discharge Planning Services: NA Post Acute Care Choice: NA Living arrangements for the past 2 months: Single Family Home                 DME Arranged: N/A DME Agency: NA       HH Arranged: NA HH Agency: NA        Prior Living Arrangements/Services Living arrangements for the past 2 months: Single Family Home Lives with:: Spouse Patient language and need for interpreter reviewed:: No Do you feel safe going back to the place where you live?: Yes      Need for Family Participation in Patient Care: Yes (Comment) Care giver support system in place?: Yes (comment)   Criminal Activity/Legal Involvement Pertinent to Current  Situation/Hospitalization: No - Comment as needed  Activities of Daily Living Home Assistive Devices/Equipment: Walker (specify type) ADL Screening (condition at time of admission) Patient's cognitive ability adequate to safely complete daily activities?: Yes Is the patient deaf or have difficulty hearing?: No Does the patient have difficulty seeing, even when wearing glasses/contacts?: Yes Does the patient have difficulty concentrating, remembering, or making decisions?: No Patient able to express need for assistance with ADLs?: Yes Does the patient have difficulty dressing or bathing?: No Independently performs ADLs?: Yes (appropriate for developmental age) Does the patient have difficulty walking or climbing stairs?: Yes Weakness of Legs: Both Weakness of Arms/Hands: Both  Permission Sought/Granted   Permission granted to share information with : No              Emotional Assessment Appearance:: Appears stated age Attitude/Demeanor/Rapport: Hostile Affect (typically observed): Defensive, In denial, Irritable Orientation: : Oriented to Self, Oriented to Place   Psych Involvement: No (comment)  Admission diagnosis:  Hypercapnia [R06.89] Altered mental status, unspecified altered mental status type [R41.82] Patient Active Problem List   Diagnosis Date Noted  . Overdose 09/21/2019  . OSA on CPAP   . Advanced care planning/counseling discussion   . Encounter for competency evaluation   . Chest pain 08/04/2019  . Acute respiratory failure with hypoxia and hypercapnia ([place]) 07/06/2019  . Acute febrile illness   . Hyperglycemia   . Weakness 05/05/2019  . Febrile illness 04/27/2019  .  Breathlessness   . Goals of care, counseling/discussion   . Palliative care by specialist   . Encounter for hospice care discussion   . COPD with acute exacerbation ([place]) 04/06/2019  . COPD exacerbation ([place]) 02/28/2019  . Community acquired pneumonia of left lower lobe of lung   . Hypoxia  12/26/2018  . Acute on chronic respiratory failure with hypoxia ([place])   . Acute on chronic diastolic CHF (congestive heart failure) ([place]) 10/02/2018  . Acute respiratory failure ([place]) 10/02/2018  . Acute [place] ulcer   . Acute GI bleeding 02/08/2018  . Anemia due to chronic kidney disease   . AKI (acute kidney injury) ([place]) 05/08/2017  . Diabetes mellitus with complication ([place]) 08/67/6195  . Melena   . Benign neoplasm of sigmoid colon   . Benign neoplasm of descending colon   . AVM (arteriovenous malformation) of colon with hemorrhage   . GI bleed 04/05/2017  . Intermittent left lower quadrant abdominal pain   . Generalized weakness 11/09/2016  . Symptomatic anemia 10/21/2016  . Low back pain 07/02/2015  . Gastric AVM   . AVM (arteriovenous malformation) of duodenum, acquired with hemorrhage   . Bipolar 1 disorder, mixed, moderate ([place]) 04/16/2015  . Major depressive disorder, recurrent, severe without psychotic features ([place])   . Supplemental oxygen dependent 11/30/2014  . Iron deficiency anemia due to chronic blood loss 11/25/2014  . Vitamin D deficiency 08/10/2014  . Insomnia 08/10/2014  . Polypharmacy 08/10/2014  . Chronic pain syndrome 08/10/2014  . 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 Abdominal pain 12/04/2013  . Aftercare following surgery of the circulatory system, [place] 11/04/2013  . Acute blood loss anemia 10/16/2013  . Multiple gastric polyps 09/10/2013  . Anxiety state 09/10/2013  . 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 Chronic hypoxemic respiratory failure ([place]) 05/21/2013  . Acute on chronic respiratory failure with hypoxia and hypercapnia ([place]) 12/04/2012  . Chronic GI bleeding 05/17/2012  . CAD (coronary artery disease) 04/17/2012  . Chronic diastolic CHF (congestive heart failure) ([place]) 03/27/2012  . COPD (chronic obstructive pulmonary disease) ([place]) 09/13/2011  . CERUMEN  IMPACTION, BILATERAL 11/11/2010  . Hyperlipidemia 08/18/2009  . Essential hypertension 08/18/2009  . ALLERGIC RHINITIS 08/18/2009  . AAA (abdominal aortic aneurysm) ([place]) 12/15/2008   PCP:  [name], MD Pharmacy:   [id number] [id number]: [phone]: [address] [place] [address]Phone: [phone]: [phone]    Social Determinants of Health (SDOH) Interventions    Readmission Risk Interventions Readmission Risk Prevention Plan 09/29/2019 08/15/2019 07/08/2019  Transportation Screening Complete Complete Complete  Medication Review (RN Care Manager) - Referral to Pharmacy Complete  PCP or Specialist appointment within 3-5 days of discharge Complete Complete Complete  HRI or Home Care Consult Complete Complete Complete  SW Recovery Care/Counseling Consult Complete - Complete  Palliative Care Screening Not Applicable Complete Patient RHarvey CedarsNot Applicable Complete Not Applicable  Some recent data might be hidden

## 2019-09-29 NOTE — Progress Notes (Signed)
This RN spoke with patient's wife and she said she will not be able to come pick him up when its dark at night. Will continue to monitor.

## 2019-09-29 NOTE — TOC Progression Note (Signed)
Transition of Care [facility]) - Progression Note    Patient Details  Name: [name] MRN: [id number] Date of Birth: 06/29/1941  Transition [facility] Care [facility]) CM/SW Contact  Joaquin Courts, RN Phone Number: 09/29/2019, 5:07 PM  Clinical Narrative:  CM received call that patients spouse is unable to pick him up from the hospital because she is unable to drive in the dark.  Patient needs a portable O2 tank for transport. Patient receives home O2 from [employer]. Agency rep Jeneen Rinks notified and will deliver portable O2 tank to bedside.       Expected Discharge Plan: Home/Self Care Barriers to Discharge: No Barriers Identified  Expected Discharge Plan and Services Expected Discharge Plan: Home/Self Care In-house Referral: NA Discharge Planning Services: NA Post Acute Care Choice: NA Living arrangements for the past 2 months: Single Family Home Expected Discharge Date: 09/29/19               DME Arranged: N/A DME Agency: NA       HH Arranged: NA HH Agency: NA         Social Determinants of Health (SDOH) Interventions    Readmission Risk Interventions Readmission Risk Prevention Plan 09/29/2019 08/15/2019 07/08/2019  Transportation Screening Complete Complete Complete  Medication Review Press photographer) - Referral to Pharmacy Complete  PCP or Specialist appointment within 3-5 days of discharge Complete Complete Complete  HRI or Home Care Consult Complete Complete Complete  SW Recovery Care/Counseling Consult Complete - Complete  Palliative Care Screening Not Applicable Complete Patient [place] Not Applicable Complete Not Applicable  Some recent data might be hidden

## 2019-09-29 NOTE — Progress Notes (Signed)
Pt has received taxi voucher and portable oxygen tank for discharge.

## 2019-09-29 NOTE — Discharge Instructions (Signed)
Follow up with Psychiatry

## 2019-09-29 NOTE — Progress Notes (Signed)
This RN went in to give the patient his discharge instructions. The patient stated "it does not matter because sometimes I can not remember my own name". This RN continued with discharge instructions as the patient would allow. This RN called the wife at 3:25pm to notify her of her husband's discharge and there was no answer. A voicemail was left. Will continue to follow up.

## 2019-09-30 ENCOUNTER — Telehealth: Payer: Self-pay

## 2019-09-30 NOTE — Telephone Encounter (Signed)
Phone call placed to patient to check in and offer to schedule a visit with Palliative Care. VM left with call back information.

## 2019-10-02 IMAGING — DX PORTABLE CHEST - 1 VIEW
1 series · 1 of 1 positions shown · non-contrast
Comparison: 12/26/2018 and earlier.

CLINICAL DATA: 78-year-old male with shortness of breath cough and
fever for 48 hours. [id number] status pending.

EXAM:
PORTABLE CHEST 1 VIEW

[chest ap]
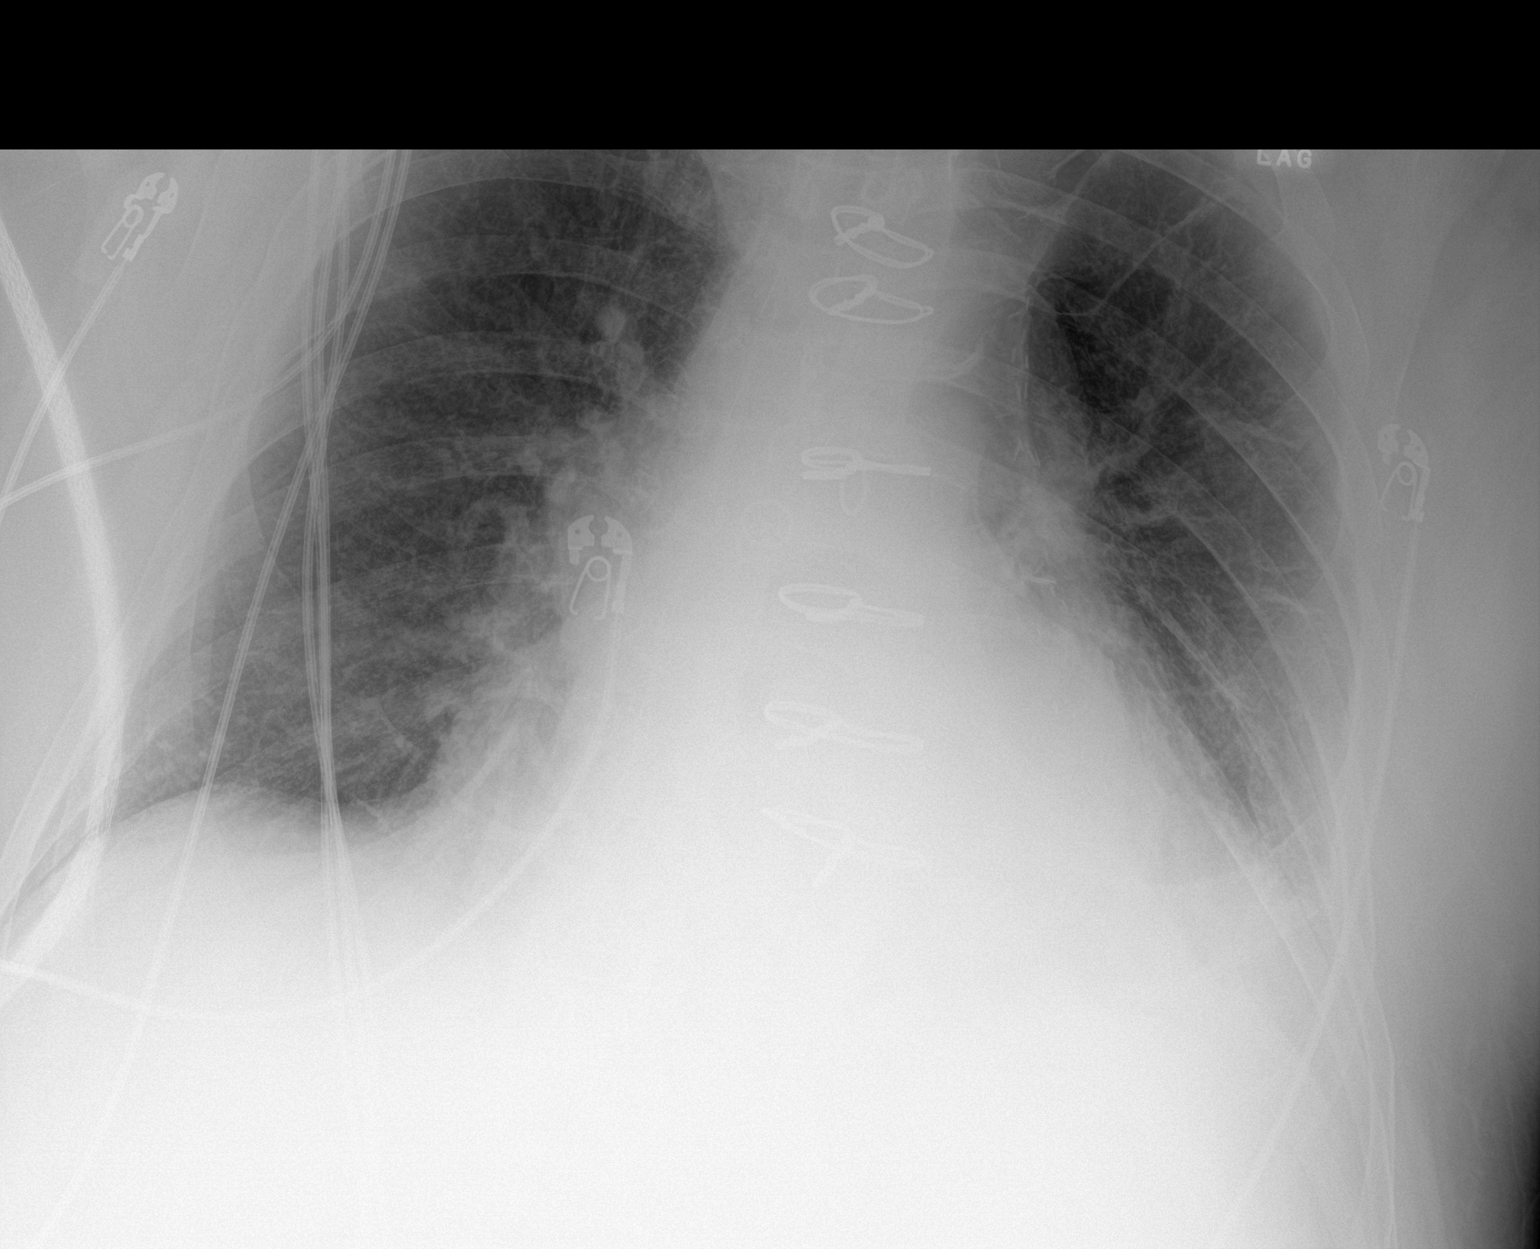

[1 of 1 positions shown; findings below may reference images not displayed]

FINDINGS: Portable AP semi upright view at 9999 hours. Prior CABG. Stable
mediastinal contours, with moderate size gastric hiatal hernia
demonstrated by CT in [REDACTED]. Chronic bullous emphysema in the
left apex and chronic blunting of the left costophrenic angle,
related to chronic atelectasis and pleural scarring. Stable lung
volumes. Increased pulmonary interstitial opacity and/or vascularity
since [REDACTED]. No pneumothorax or new confluent pulmonary opacity.
Paucity bowel gas in the upper abdomen.
IMPRESSION: 1. Increased pulmonary interstitial opacity or vascularity since
[REDACTED]. Consider interstitial edema versus viral/atypical respiratory
infection.
2. Chronic left lung and pleural disease. Chronic moderate size
hiatal hernia.

## 2019-10-06 ENCOUNTER — Other Ambulatory Visit: Payer: Self-pay

## 2019-10-06 ENCOUNTER — Encounter (HOSPITAL_COMMUNITY): Payer: Self-pay | Admitting: Emergency Medicine

## 2019-10-06 ENCOUNTER — Emergency Department (HOSPITAL_COMMUNITY)
Admission: EM | Admit: 2019-10-06 | Discharge: 2019-10-06 | Disposition: A | Discharge status: Home / Self Care | Payer: Medicare HMO | Attending: Emergency Medicine | Admitting: Emergency Medicine

## 2019-10-06 ENCOUNTER — Emergency Department (HOSPITAL_COMMUNITY): Payer: Medicare HMO

## 2019-10-06 DIAGNOSIS — I252 Old myocardial infarction: Secondary | ICD-10-CM | POA: Diagnosis not present

## 2019-10-06 DIAGNOSIS — Z79899 Other long term (current) drug therapy: Secondary | ICD-10-CM | POA: Insufficient documentation

## 2019-10-06 DIAGNOSIS — I110 Hypertensive heart disease with heart failure: Secondary | ICD-10-CM | POA: Diagnosis not present

## 2019-10-06 DIAGNOSIS — I5032 Chronic diastolic (congestive) heart failure: Secondary | ICD-10-CM | POA: Diagnosis not present

## 2019-10-06 DIAGNOSIS — E119 Type 2 diabetes mellitus without complications: Secondary | ICD-10-CM | POA: Insufficient documentation

## 2019-10-06 DIAGNOSIS — I2510 Atherosclerotic heart disease of native coronary artery without angina pectoris: Secondary | ICD-10-CM | POA: Diagnosis not present

## 2019-10-06 DIAGNOSIS — R531 Weakness: Secondary | ICD-10-CM | POA: Diagnosis present

## 2019-10-06 DIAGNOSIS — Z87891 Personal history of nicotine dependence: Secondary | ICD-10-CM | POA: Insufficient documentation

## 2019-10-06 DIAGNOSIS — K1370 Unspecified lesions of oral mucosa: Secondary | ICD-10-CM | POA: Diagnosis not present

## 2019-10-06 DIAGNOSIS — R197 Diarrhea, unspecified: Secondary | ICD-10-CM | POA: Diagnosis not present

## 2019-10-06 DIAGNOSIS — K121 Other forms of stomatitis: Secondary | ICD-10-CM | POA: Insufficient documentation

## 2019-10-06 DIAGNOSIS — J449 Chronic obstructive pulmonary disease, unspecified: Secondary | ICD-10-CM | POA: Diagnosis not present

## 2019-10-06 DIAGNOSIS — Z794 Long term (current) use of insulin: Secondary | ICD-10-CM | POA: Diagnosis not present

## 2019-10-06 DIAGNOSIS — R509 Fever, unspecified: Secondary | ICD-10-CM | POA: Diagnosis not present

## 2019-10-06 LAB — CBC WITH DIFFERENTIAL/PLATELET
Abs Immature Granulocytes: 0.51 10*3/uL — ABNORMAL HIGH (ref 0.00–0.07)
Basophils Absolute: 0 10*3/uL (ref 0.0–0.1)
Basophils Relative: 1 %
Eosinophils Absolute: 0.3 10*3/uL (ref 0.0–0.5)
Eosinophils Relative: 5 %
HCT: 31.3 % — ABNORMAL LOW (ref 39.0–52.0)
Hemoglobin: 8.8 g/dL — ABNORMAL LOW (ref 13.0–17.0)
Immature Granulocytes: 7 %
Lymphocytes Relative: 12 %
Lymphs Abs: 0.8 10*3/uL (ref 0.7–4.0)
MCH: 27 pg (ref 26.0–34.0)
MCHC: 28.1 g/dL — ABNORMAL LOW (ref 30.0–36.0)
MCV: 96 fL (ref 80.0–100.0)
Monocytes Absolute: 0.6 10*3/uL (ref 0.1–1.0)
Monocytes Relative: 9 %
Neutro Abs: 4.8 10*3/uL (ref 1.7–7.7)
Neutrophils Relative %: 66 %
Platelets: 235 10*3/uL (ref 150–400)
RBC: 3.26 MIL/uL — ABNORMAL LOW (ref 4.22–5.81)
RDW: 16 % — ABNORMAL HIGH (ref 11.5–15.5)
WBC: 7.1 10*3/uL (ref 4.0–10.5)
nRBC: 0 % (ref 0.0–0.2)

## 2019-10-06 LAB — COMPREHENSIVE METABOLIC PANEL
ALT: 14 U/L (ref 0–44)
AST: 13 U/L — ABNORMAL LOW (ref 15–41)
Albumin: 3.2 g/dL — ABNORMAL LOW (ref 3.5–5.0)
Alkaline Phosphatase: 115 U/L (ref 38–126)
Anion gap: 9 (ref 5–15)
BUN: 35 mg/dL — ABNORMAL HIGH (ref 8–23)
CO2: 37 mmol/L — ABNORMAL HIGH (ref 22–32)
Calcium: 9.1 mg/dL (ref 8.9–10.3)
Chloride: 96 mmol/L — ABNORMAL LOW (ref 98–111)
Creatinine, Ser: 1.42 mg/dL — ABNORMAL HIGH (ref 0.61–1.24)
GFR calc Af Amer: 55 mL/min — ABNORMAL LOW (ref >60)
GFR calc non Af Amer: 47 mL/min — ABNORMAL LOW (ref >60)
Glucose, Bld: 136 mg/dL — ABNORMAL HIGH (ref 70–99)
Potassium: 5.2 mmol/L — ABNORMAL HIGH (ref 3.5–5.1)
Sodium: 142 mmol/L (ref 135–145)
Total Bilirubin: 0.1 mg/dL — ABNORMAL LOW (ref 0.3–1.2)
Total Protein: 6.7 g/dL (ref 6.5–8.1)

## 2019-10-06 LAB — URINALYSIS, ROUTINE W REFLEX MICROSCOPIC
Bilirubin Urine: NEGATIVE
Glucose, UA: NEGATIVE mg/dL
Hgb urine dipstick: NEGATIVE
Ketones, ur: NEGATIVE mg/dL
Leukocytes,Ua: NEGATIVE
Nitrite: NEGATIVE
Protein, ur: NEGATIVE mg/dL
Specific Gravity, Urine: 1.016 (ref 1.005–1.030)
pH: 6 (ref 5.0–8.0)

## 2019-10-06 MED ORDER — MAGIC MOUTHWASH
10.0000 mL | Freq: Once | ORAL | Status: AC
  Administered 2019-10-06: 10 mL via ORAL
  Filled 2019-10-06: qty 10

## 2019-10-06 MED ORDER — MAGIC MOUTHWASH
10.0000 mL | Freq: Four times a day (QID) | ORAL | Status: DC | PRN
  Administered 2019-10-06: 10 mL via ORAL
  Filled 2019-10-06 (×2): qty 10

## 2019-10-06 MED ORDER — SODIUM CHLORIDE 0.9 % IV BOLUS
500.0000 mL | Freq: Once | INTRAVENOUS | Status: AC
  Administered 2019-10-06: 500 mL via INTRAVENOUS

## 2019-10-06 NOTE — ED Notes (Signed)
(  [phone], wife says call when the discharge paperwork is ready and wife will pick pt up.

## 2019-10-06 NOTE — Progress Notes (Signed)
CSW provided pt with counseling and online AA meeting options.  Pt initially refused but then accpeted resources.  CSW will continue to follow for D/C needs.  Alphonse Guild. Tamikia Chowning, LCSW, LCAS, CSI Transitions of Care Clinical Social Worker Care Coordination Department Ph: [phone]

## 2019-10-06 NOTE — ED Triage Notes (Addendum)
Pt comes to ed via ems, c/o pt said he has shortness of breathe with hx of copd, and unable to take care of himself. Pt on 6 liters 02 at baseline. Wife can't help him as much/ pt hx of diabetes, freq urination and diarrhea.  V/s on arrival 117/68, pluse 102, rr20, spo2 100 on 6 liters home 02, cbg 175, temp 98.2.

## 2019-10-06 NOTE — ED Notes (Signed)
Patient wife will be here at or around 1400 to pick patient up to take home.

## 2019-10-06 NOTE — ED Notes (Signed)
Pt had a solid BM. BM not able to be sent to lab due to consistency not meeting requirements.

## 2019-10-06 NOTE — ED Provider Notes (Signed)
Care assumed at shift change, pending social work/PT/OT consult, from [place].  See her note for full HPI, work-up and MDM.  Patient presenting with generalized weakness and diarrhea since recent hospital discharge.  He reportedly called out to EMS because he was too weak to get up to use the restroom.  Currently pending social work/PT/OT consult, as patient needs help with ADLs.  Plan for discharge following consult.  Physical Exam  BP (!) 97/57   Pulse 94   Temp 98.1 F (36.7 C) (Oral)   Resp 14   SpO2 97%   Physical Exam Vitals and nursing note reviewed.  Constitutional:      Appearance: He is well-developed.  HENT:     Head: Normocephalic and atraumatic.  Cardiovascular:     Rate and Rhythm: Normal rate.  Pulmonary:     Effort: Pulmonary effort is normal.  Neurological:     Mental Status: He is alert.     ED Course/Procedures   Clinical Course as of Oct 05 1344  Sun Oct 06, 2019  [id number] Attempted to call the patient's wife, [name] for collateral information.  Unfortunately, I was unable to get a hold of her.   [MM]  0936 Pt reportedly just passed a solid stool. Will cancel c diff test, unlikely c diff.   [JR]  37 Discussed with social work who had lengthy conversation with both pt and then patient's wife over the phone. Pt refused PT/OT resources, wife reports pt malingers to manipulate her and will be witnessed by other family members ambulating on his own without difficulty. Will provide pt with outpt counseling resources. Safe for discharge.   [JR]    Clinical Course User Index [JR] Aveon Colquhoun, [place] N, PA-C [MM] McDonald, Laymond Purser, PA-C    Procedures        Phala Schraeder, [place] N, [name] 10/06/19 1345    Merryl Hacker, MD 10/06/19 [phone]

## 2019-10-06 NOTE — Progress Notes (Addendum)
CSW met with pt who stated he would like his wife to pick him up, per ED Secretary's note, "([phone], wife says call when the discharge paperwork is ready and wife will pick pt up."  CSW offered pt SNF placement options and HH options and pt refused, but did say he would accept counseling resources.  Pt's wife states she believes pt is capable and that when the pt's wife is not home the pt is witnessed by family and a boarder ambulating in the home with ease and the pt feigns weakness with a huge need for oxygen that the pt only utilizes in order to seek food and drink options from his wife.  Pt's wife ventilated her frustrations with the pt and discussed at length the pt's HX of attempting to manipulate his spouse.  Pt's wife states that in the past the pt has insulted [place] workers and refused to participate in PT/OT in the home and refused SNF placement.  CSW will continue to follow for D/C needs.  Alphonse Guild. Marlaina Coburn, LCSW, LCAS, CSI Transitions of Care Clinical Social Worker Care Coordination Department Ph: [phone]

## 2019-10-06 NOTE — Progress Notes (Signed)
Pt's wife states she is en route to pick the pt upand will be here at 2pm.  Pt's wife requests pt be ready to go when she gets here.  RN/EDP updated  Please reconsult if future social work needs arise.  CSW signing off, as social work intervention is no longer needed.  Alphonse Guild. Calandra Madura, LCSW, LCAS, CSI Transitions of Care Clinical Social Worker Care Coordination Department Ph: [phone]

## 2019-10-06 NOTE — ED Notes (Signed)
Patient has removed BP monitoring cuff from arm after being informed not to.

## 2019-10-06 NOTE — ED Notes (Signed)
Social Worker is at patient bedside at this time.
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
## 2019-10-21 ENCOUNTER — Telehealth: Payer: Self-pay

## 2019-10-21 NOTE — Telephone Encounter (Signed)
Phone call placed to patient's wife, [name], to check in on patient and on her. [name] shared that patient is still sleeping. She now does not wake him up at a certain time but lets him sleep until he is ready to wake up. [name] shared that the last 2 times that he went to the ED it was due to hypoglycemia. [name] shared that she thinks that was caused from chronic diarrhea. Patient continues to take imodium routinely. Patient stays in bed and only will get up to use that bathroom. [name] shared that he continues to be unpleasant towards her. Patient's wife shared that she had to put boundaries in place regarding his care. [name] will not do tasks that patient is able to do himself. Encouraged patient's wife to continue self care. Offered to schedule a tele-health visit with Palliative NP. Wife receptive to schedule this.

## 2019-10-22 ENCOUNTER — Other Ambulatory Visit: Payer: Medicare HMO | Admitting: Hospice

## 2019-10-22 ENCOUNTER — Telehealth: Payer: Self-pay | Admitting: Hospice

## 2019-10-22 ENCOUNTER — Other Ambulatory Visit: Payer: Self-pay

## 2019-10-22 NOTE — Telephone Encounter (Signed)
Called both patient and his spouse several times for scheduled palliative follow up; no answer. NP left voicemail with call back number

## 2019-11-01 IMAGING — CT CT ANGIOGRAPHY CHEST
2 of 6 series · 18 of 46 positions shown · IV contrast (omnipaque)
Comparison: Chest CT 10/13/2018
COMPARISON: Chest CT 10/13/2018

Addendum:
CLINICAL DATA: Elevated D-dimer.  Short of breath with exertion.

EXAM:
CT ANGIOGRAPHY CHEST WITH CONTRAST
TECHNIQUE: Multidetector CT imaging of the chest was performed using the
standard protocol during bolus administration of intravenous
contrast. Multiplanar CT image reconstructions and MIPs were
obtained to evaluate the vascular anatomy.
CONTRAST:  100mL OMNIPAQUE IOHEXOL 350 MG/ML SOLN

[Series 5: thins · axial · 0.90mm/px · z∈[-322,+2]mm · 15 of 356 slices shown]
[im 16/356  lung]
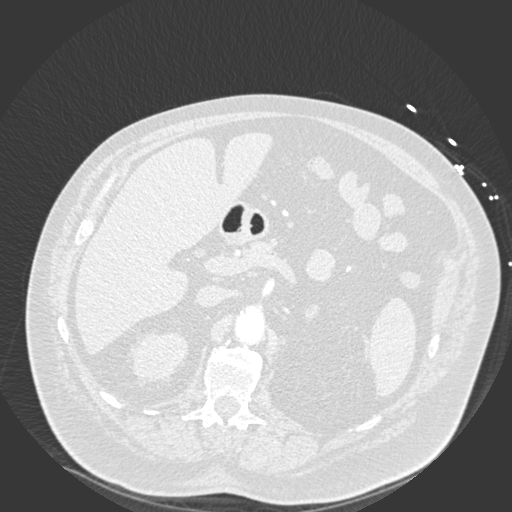
[im 47/356  soft-tissue]
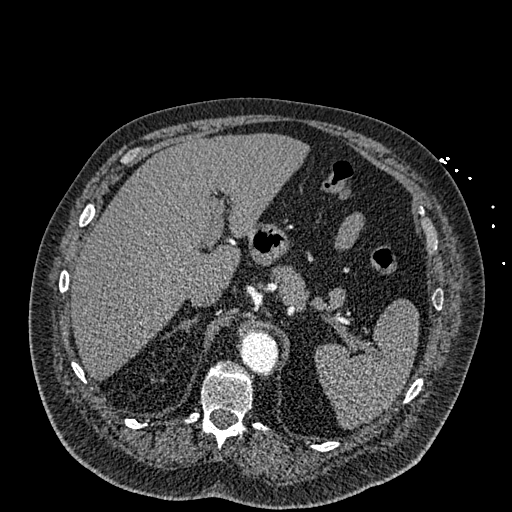
[im 62/356  lung]
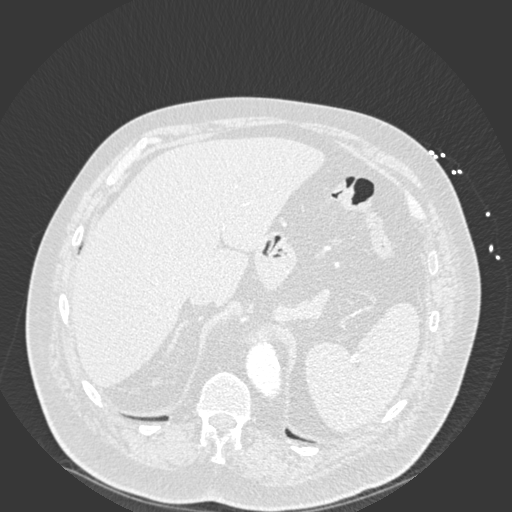
[im 93/356  soft-tissue]
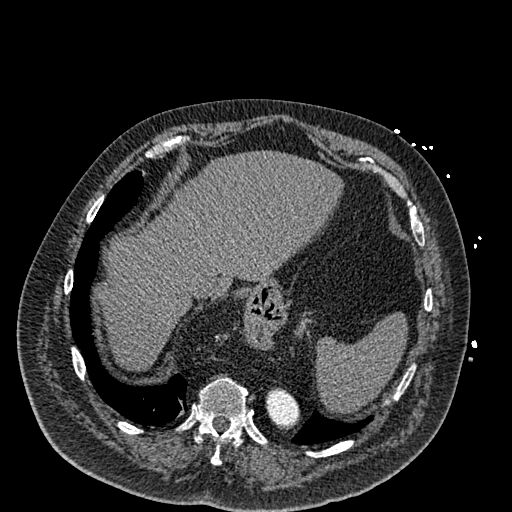
[im 109/356  lung]
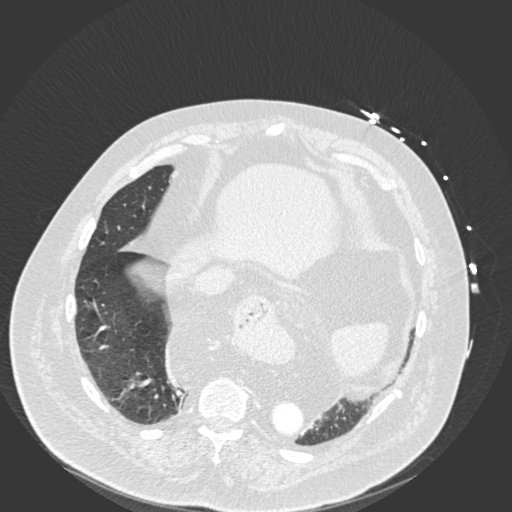
[im 139/356  soft-tissue]
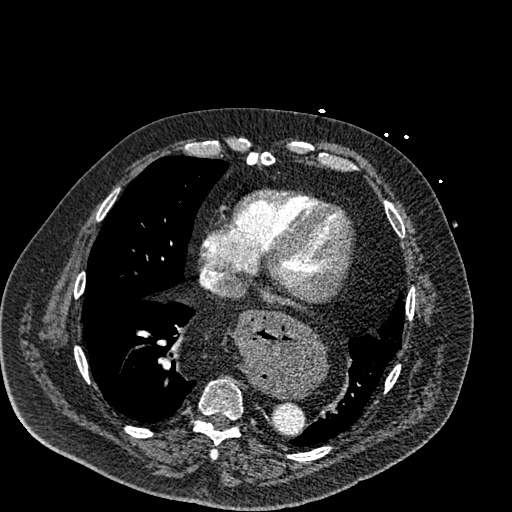
[im 155/356  lung]
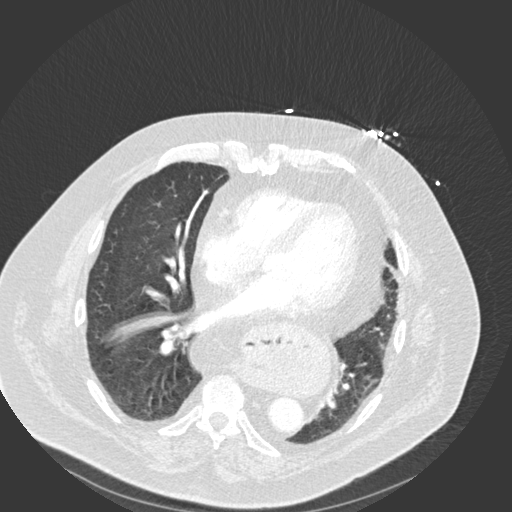
[im 186/356  soft-tissue]
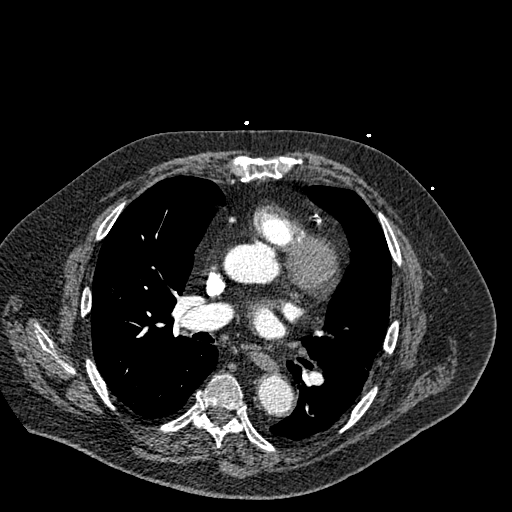
[im 201/356  lung]
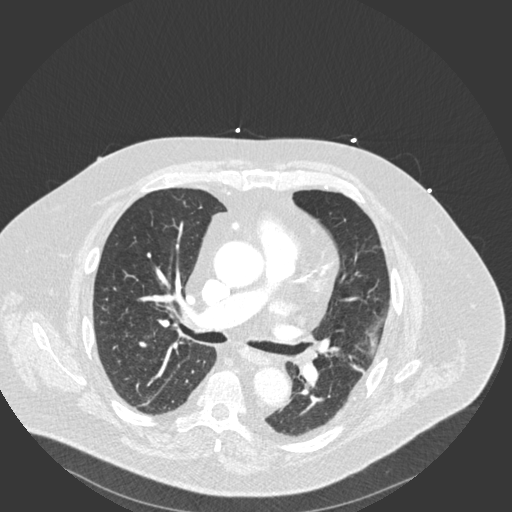
[im 217/356  soft-tissue]
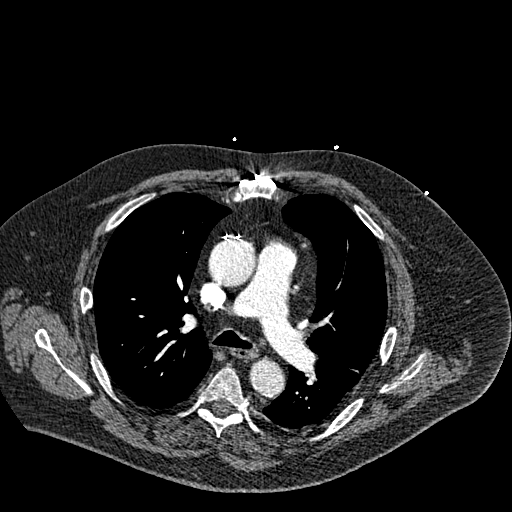
[im 247/356  lung]
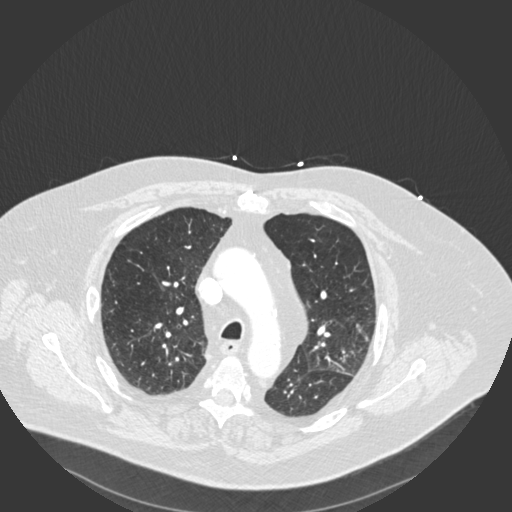
[im 263/356  soft-tissue]
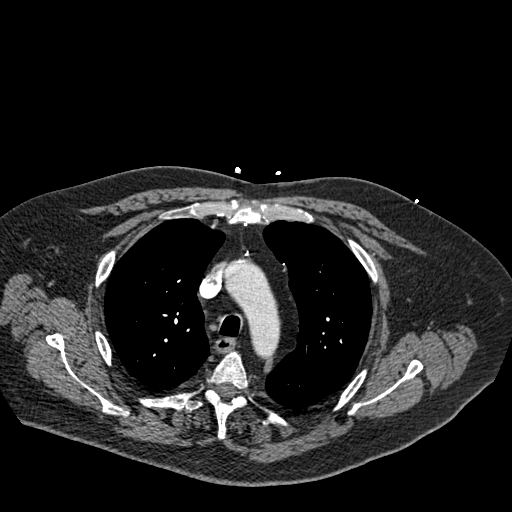
[im 294/356  lung]
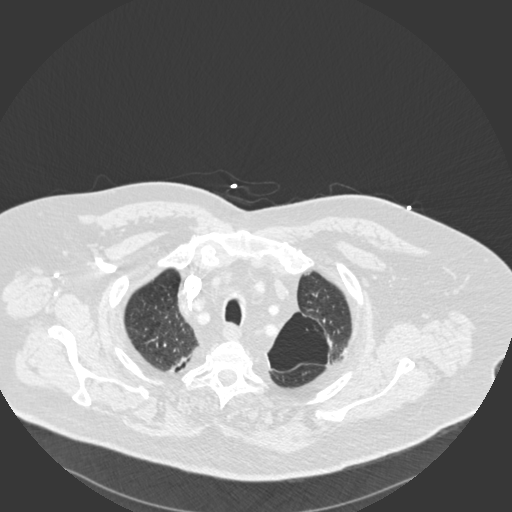
[im 309/356  soft-tissue]
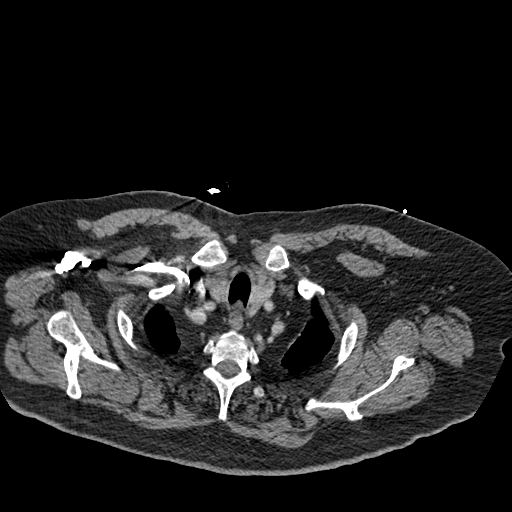
[im 340/356  lung]
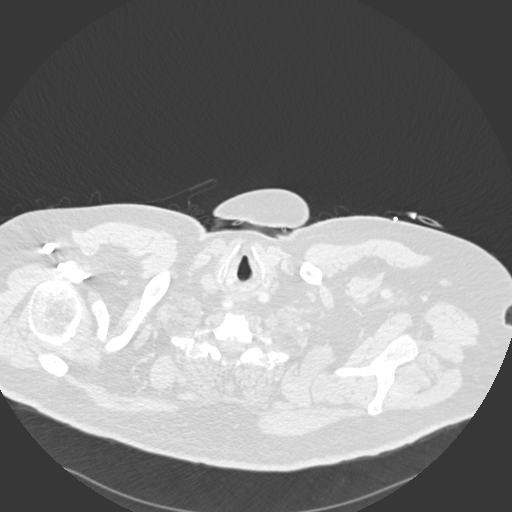

[Series 6: coronal mpr · coronal · 0.84mm/px · 3 of 181 slices shown]
[im 46/181  soft-tissue]
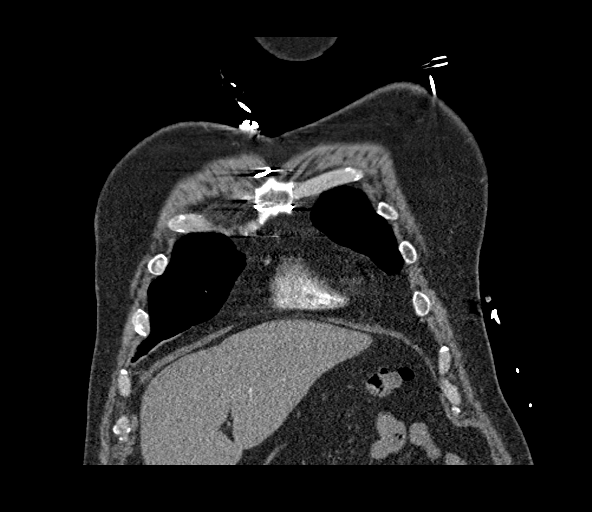
[im 91/181  soft-tissue]
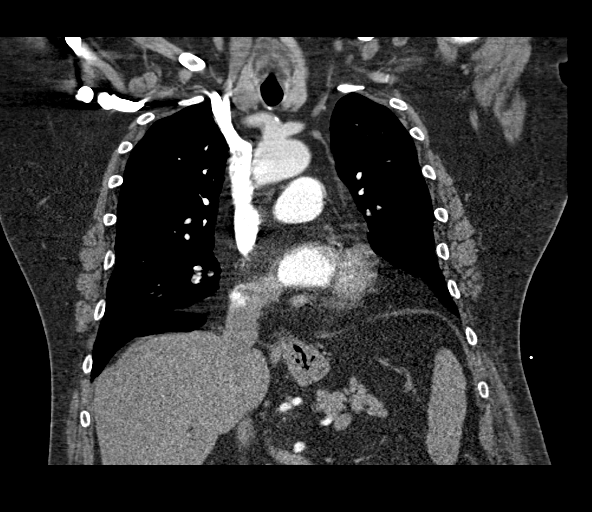
[im 136/181  soft-tissue]
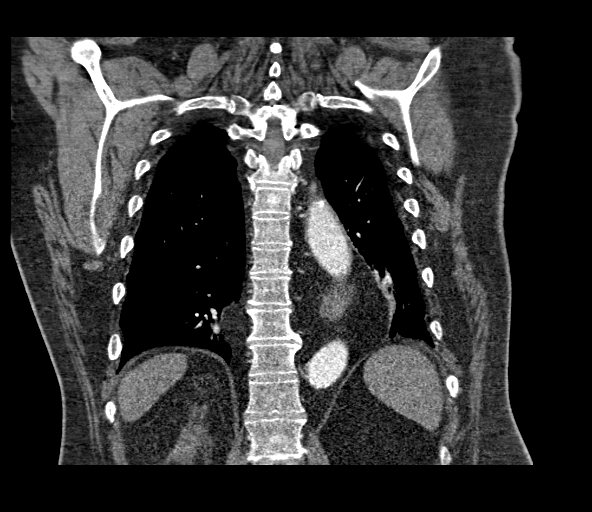

[18 of 46 positions shown; findings below may reference images not displayed]

FINDINGS: Cardiovascular:  Post CABG

No filling defects within the pulmonary arteries to suggest acute
pulmonary embolism. No acute findings of the aorta or great vessels.
No pericardial fluid.

Mediastinum/Nodes: No axillary supraclavicular adenopathy. No
mediastinal hilar adenopathy. No pericardial effusion. Large hiatal
hernia posterior LEFT heart.

Lungs/Pleura: No suspicious pulmonary nodules. Paraseptal emphysema
in the LEFT upper lobe upper lobe. Atelectasis in the LEFT lower
lobe. Band of atelectasis in the RIGHT lower lobe

Upper Abdomen: Limited view of the liver, kidneys, pancreas are
unremarkable. Normal adrenal glands. Upper margin of abdominal
aortic stent graft noted

Musculoskeletal: No aggressive osseous lesion.

Review of the MIP images confirms the above findings.
IMPRESSION: 1. No evidence acute pulmonary embolism.
2. Atelectasis in LEFT lung and paraseptal emphysema in the LEFT
upper lobe. No acute cardiopulmonary findings.
3. Large hiatal hernia.

ADDENDUM:
There is mild central bronchiectasis

*** End of Addendum ***
FINDINGS: Cardiovascular:  Post CABG

No filling defects within the pulmonary arteries to suggest acute
pulmonary embolism. No acute findings of the aorta or great vessels.
No pericardial fluid.

Mediastinum/Nodes: No axillary supraclavicular adenopathy. No
mediastinal hilar adenopathy. No pericardial effusion. Large hiatal
hernia posterior LEFT heart.

Lungs/Pleura: No suspicious pulmonary nodules. Paraseptal emphysema
in the LEFT upper lobe upper lobe. Atelectasis in the LEFT lower
lobe. Band of atelectasis in the RIGHT lower lobe

Upper Abdomen: Limited view of the liver, kidneys, pancreas are
unremarkable. Normal adrenal glands. Upper margin of abdominal
aortic stent graft noted

Musculoskeletal: No aggressive osseous lesion.

Review of the MIP images confirms the above findings.
IMPRESSION: 1. No evidence acute pulmonary embolism.
2. Atelectasis in LEFT lung and paraseptal emphysema in the LEFT
upper lobe. No acute cardiopulmonary findings.
3. Large hiatal hernia.

## 2019-11-01 IMAGING — DX PORTABLE CHEST - 1 VIEW
1 series · 1 of 1 positions shown · non-contrast
Comparison: 02/28/2019

CLINICAL DATA: Shortness of breath. COPD. Congestive heart failure

EXAM:
PORTABLE CHEST 1 VIEW

[chest ap]
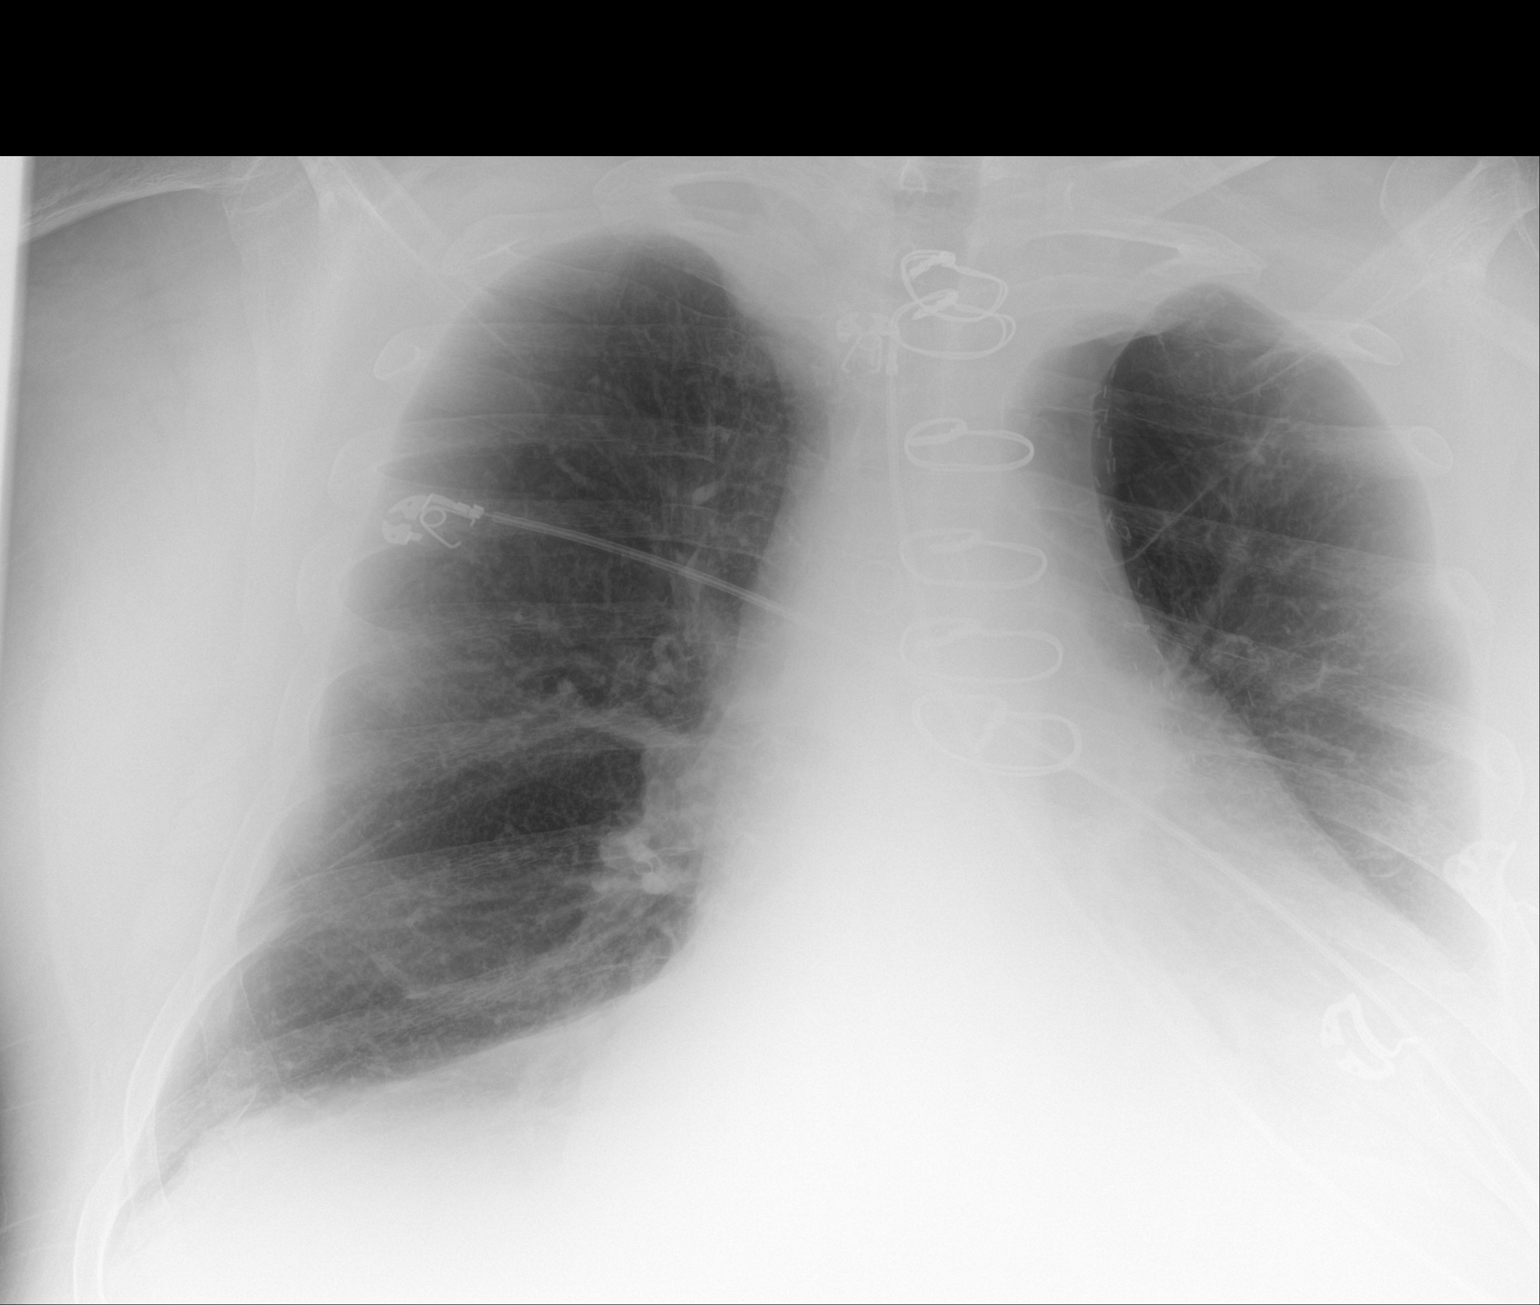

[1 of 1 positions shown; findings below may reference images not displayed]

FINDINGS: Stable cardiomegaly. Prior CABG. Pulmonary hyperinflation is
consistent with COPD. Both lungs are grossly clear. Lordotic
positioning noted.
IMPRESSION: Stable cardiomegaly and COPD. No active lung disease.

## 2019-11-08 IMAGING — DX PORTABLE CHEST - 1 VIEW
1 series · 1 of 1 positions shown · non-contrast
Comparison: 03/30/2019 CT chest and chest radiograph.

CLINICAL DATA: 78 y/o M; worsening respiratory distress. History of
COPD.

EXAM:
PORTABLE CHEST 1 VIEW

[chest ap]
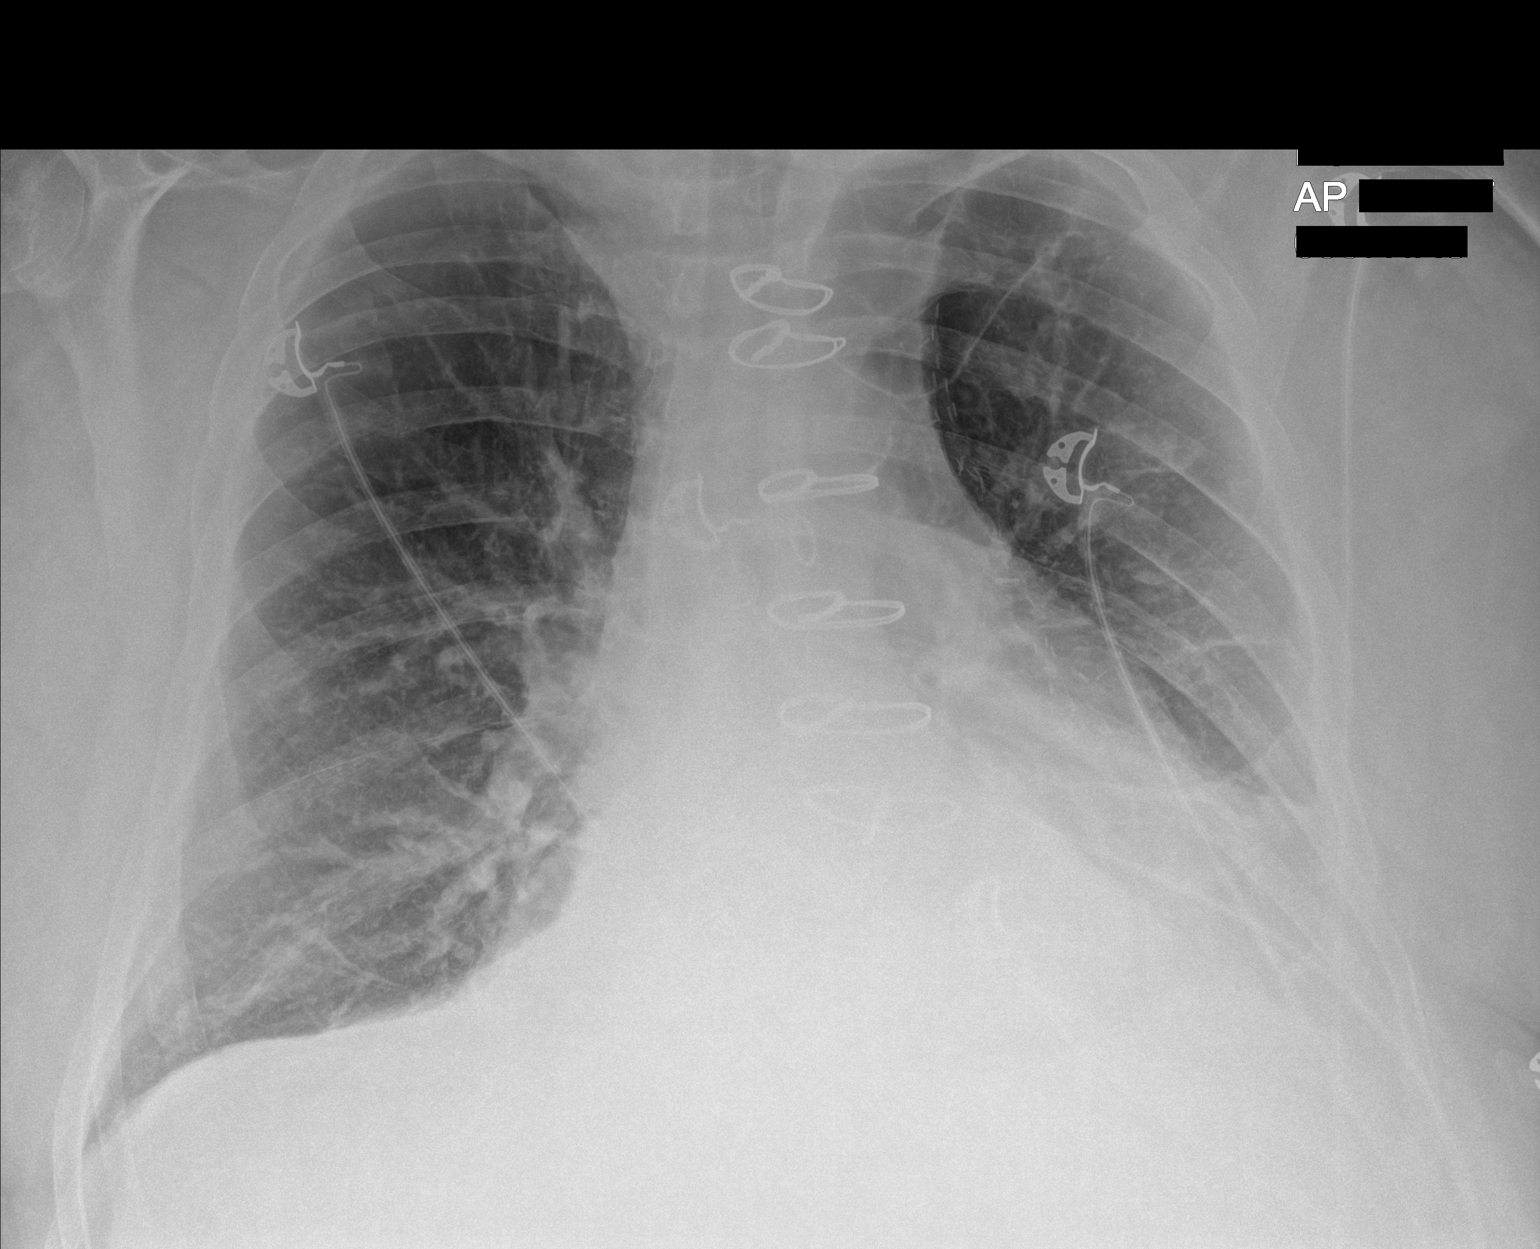

[1 of 1 positions shown; findings below may reference images not displayed]

FINDINGS: Stable cardiac silhouette given projection and technique. Status
post CABG. Large hiatal hernia. Left mid and lower lung zone linear,
likely atelectasis. Emphysema. Aortic atherosclerosis with
calcification. No pleural effusion or pneumothorax. No acute osseous
abnormality is evident.
IMPRESSION: Left mid and lower lung zone atelectasis. Large hiatal hernia.
Aortic atherosclerosis.

## 2019-11-29 IMAGING — DX LUMBAR SPINE - COMPLETE 4+ VIEW
5 series · 5 of 5 positions shown · non-contrast
Comparison: CT of the chest March 30, 2019

CLINICAL DATA: Pain after fall

EXAM:
LUMBAR SPINE - COMPLETE 4+ VIEW

[l-spine obl (1 of 2)]
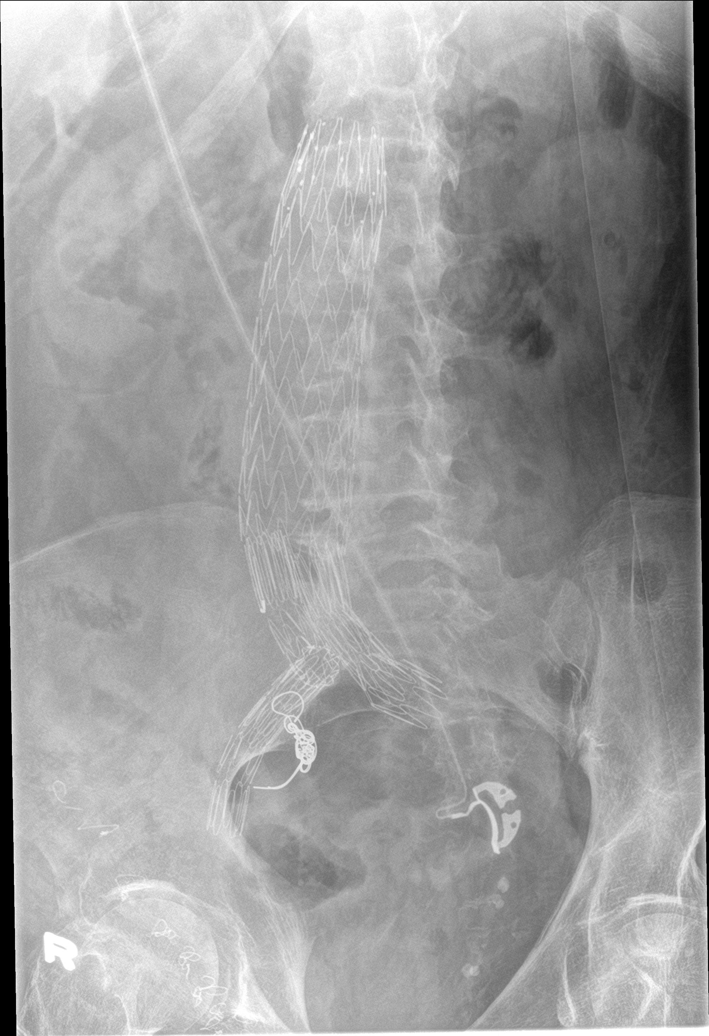

[l-spine obl (2 of 2)]
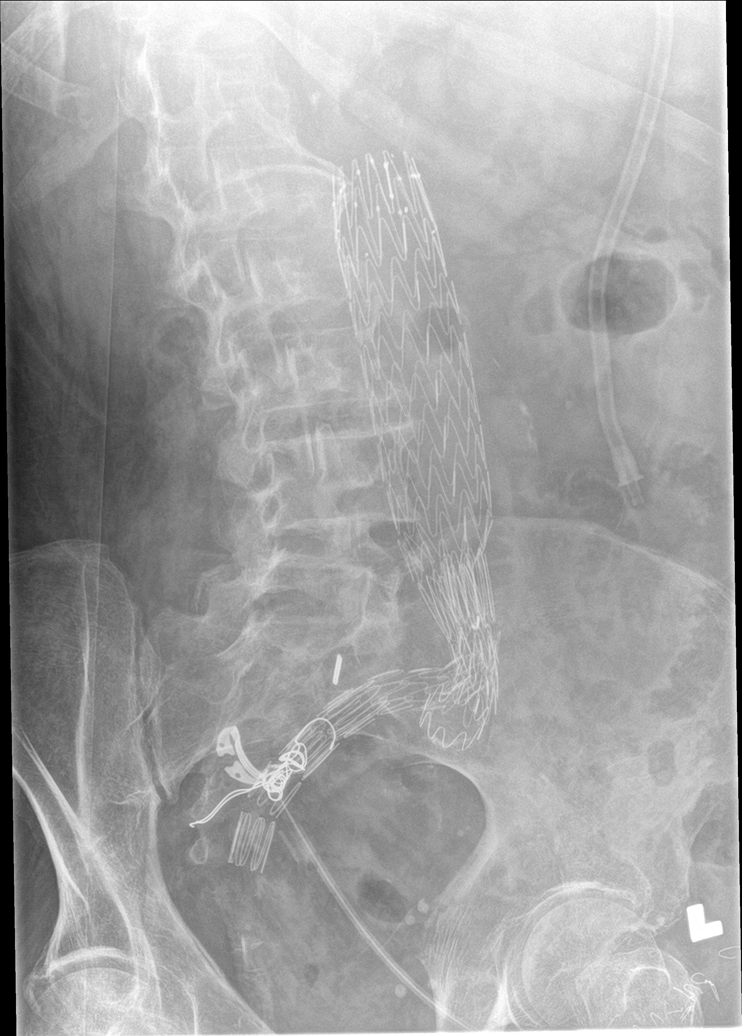

[l-spine lat]
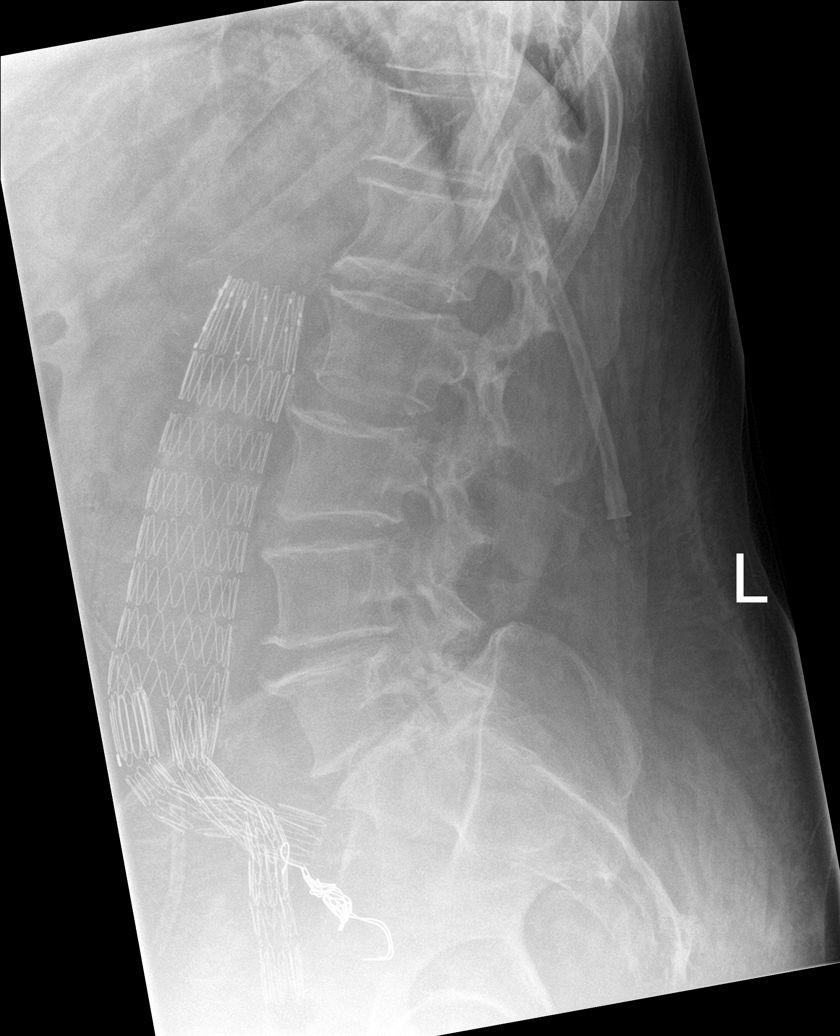

[l-spine ap]
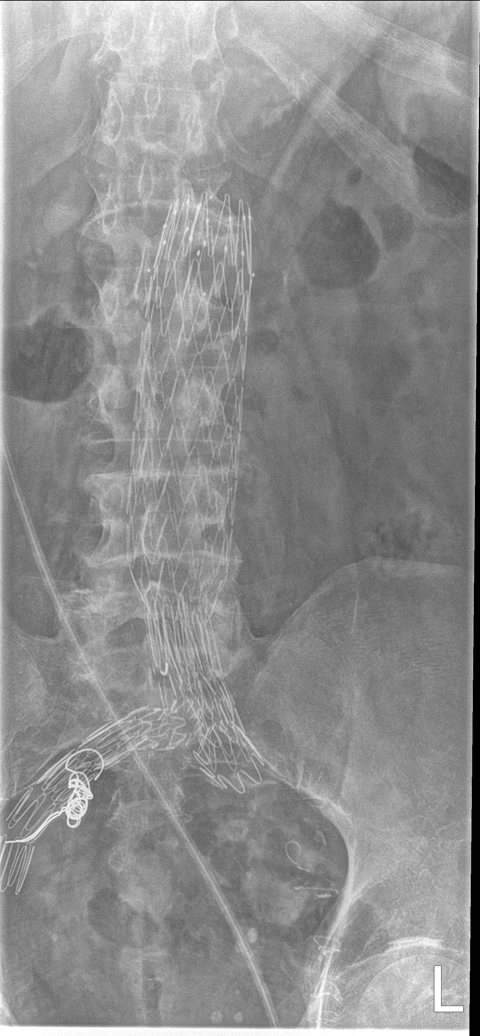

[l-spine spot]
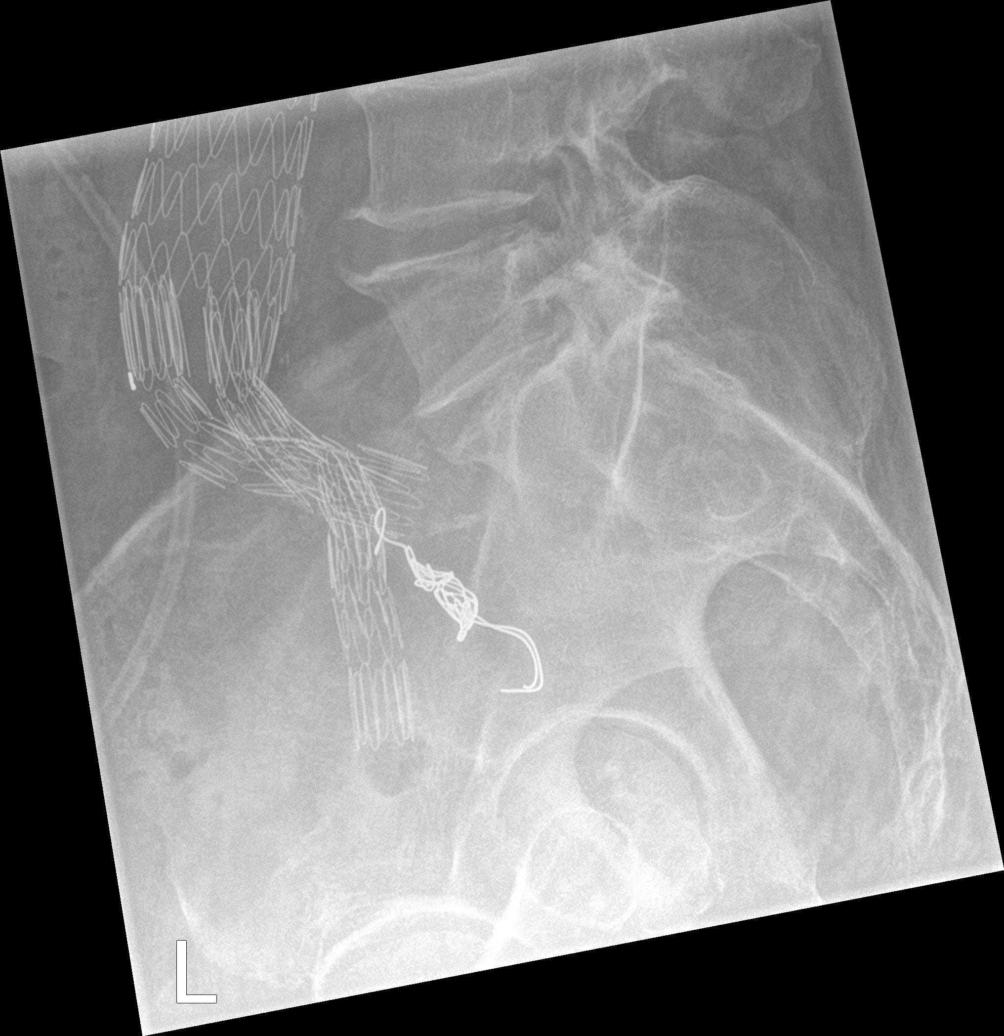

[5 of 5 positions shown; findings below may reference images not displayed]

FINDINGS: Mild anterior wedging of T12 is stable. No acute fracture or
traumatic malalignment. Multilevel degenerative changes. Lower
lumbar facet degenerative changes.
IMPRESSION: Degenerative changes as above. No acute fracture or traumatic
malalignment. Mild wedging of T12 is nonacute.

## 2019-11-29 IMAGING — DX THORACIC SPINE 2 VIEWS
4 series · 4 of 4 positions shown · non-contrast
Comparison: None.

CLINICAL DATA: Pain after fall

EXAM:
THORACIC SPINE 2 VIEWS

[t-spine ap]
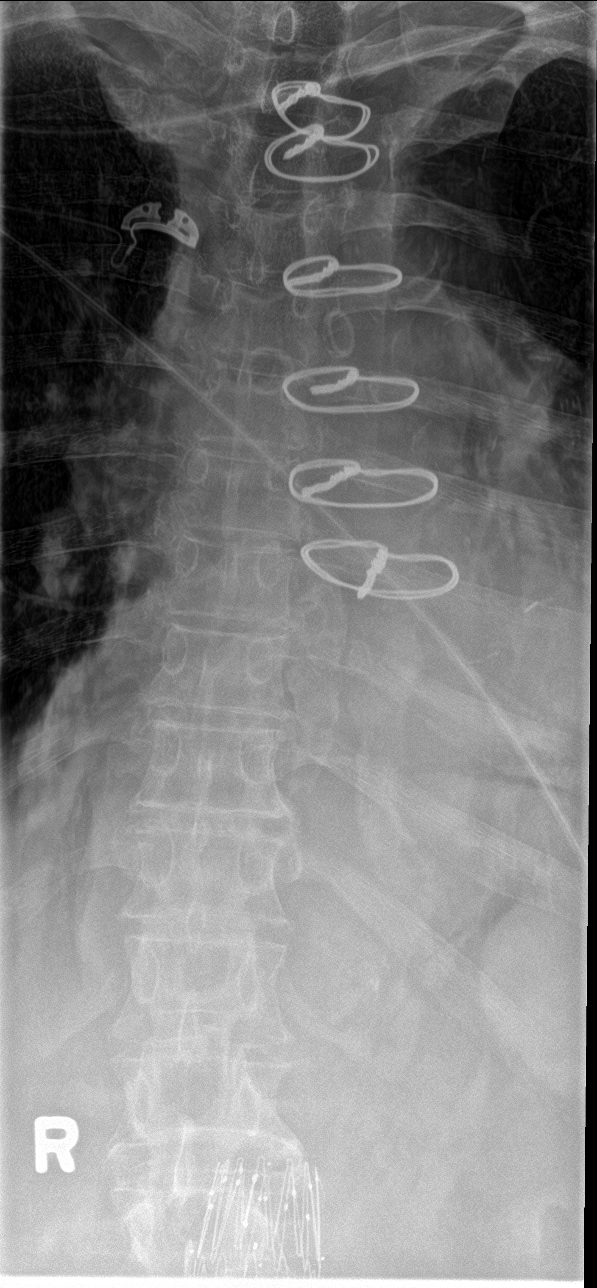

[t-spine lat]
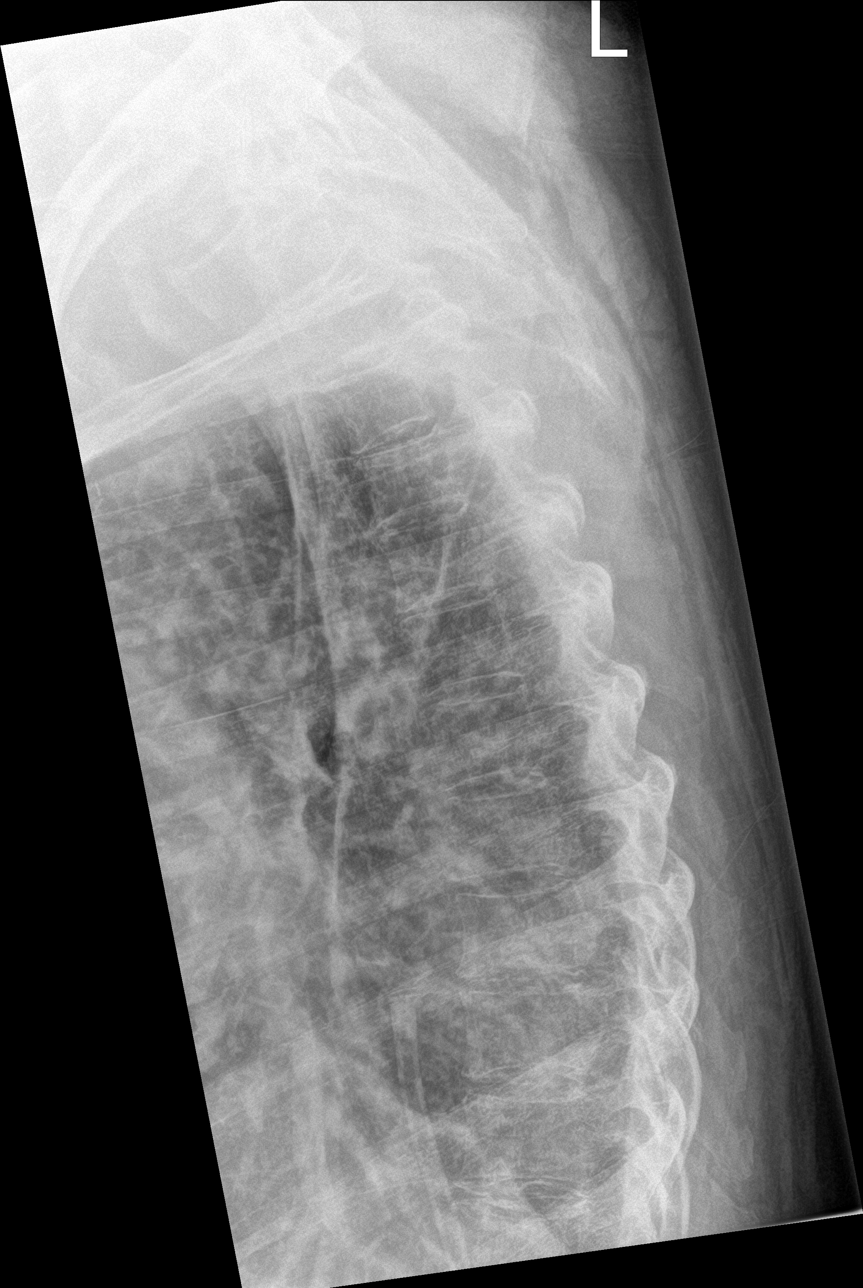

[t-spine swimmers (1 of 2)]
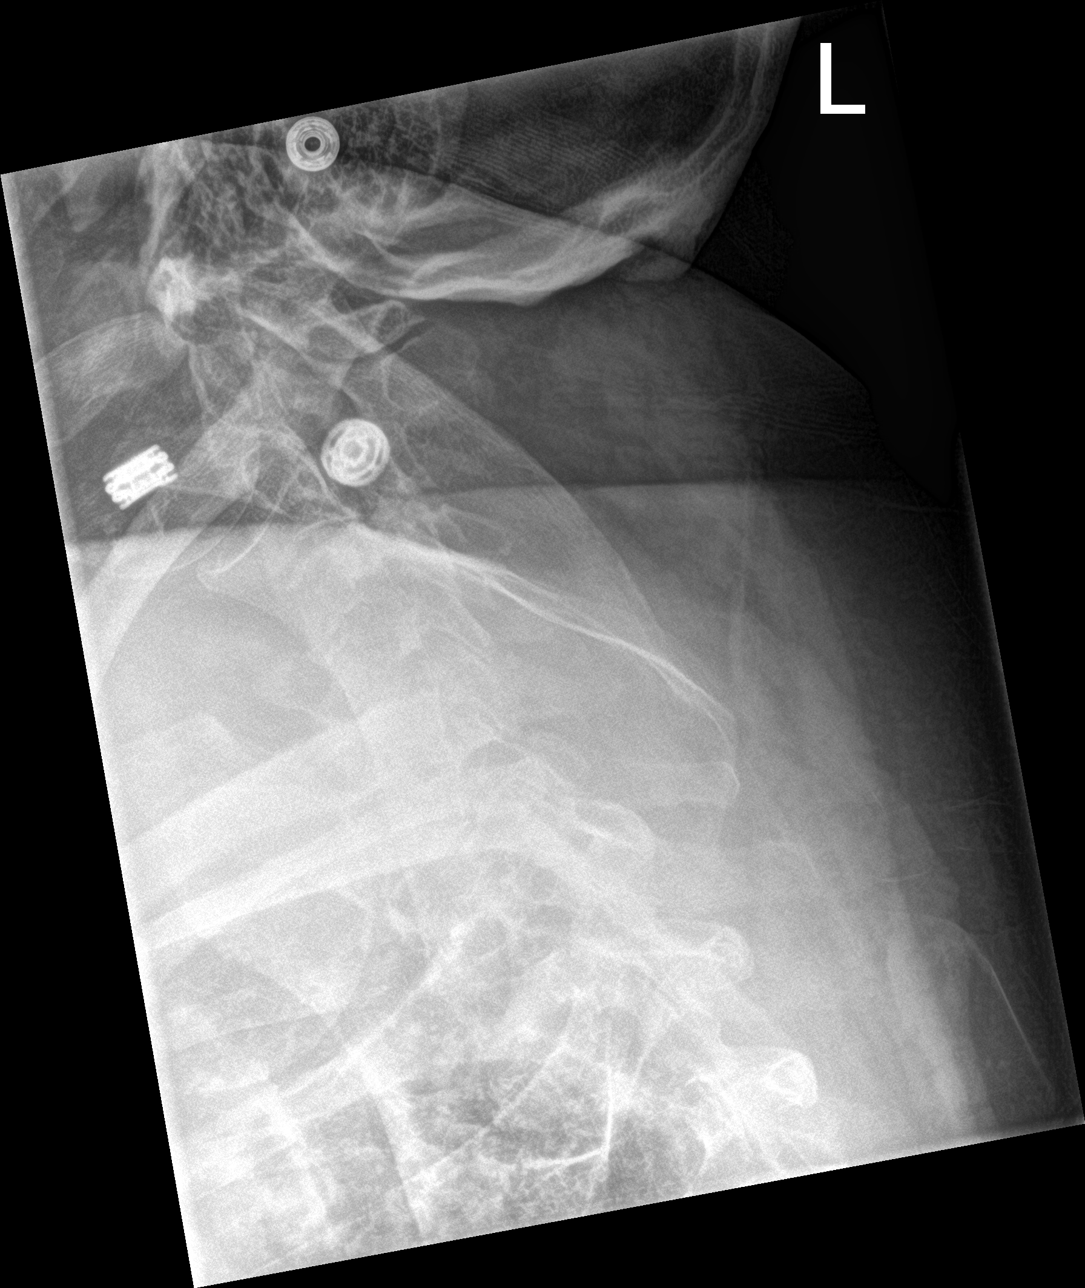

[t-spine swimmers (2 of 2)]
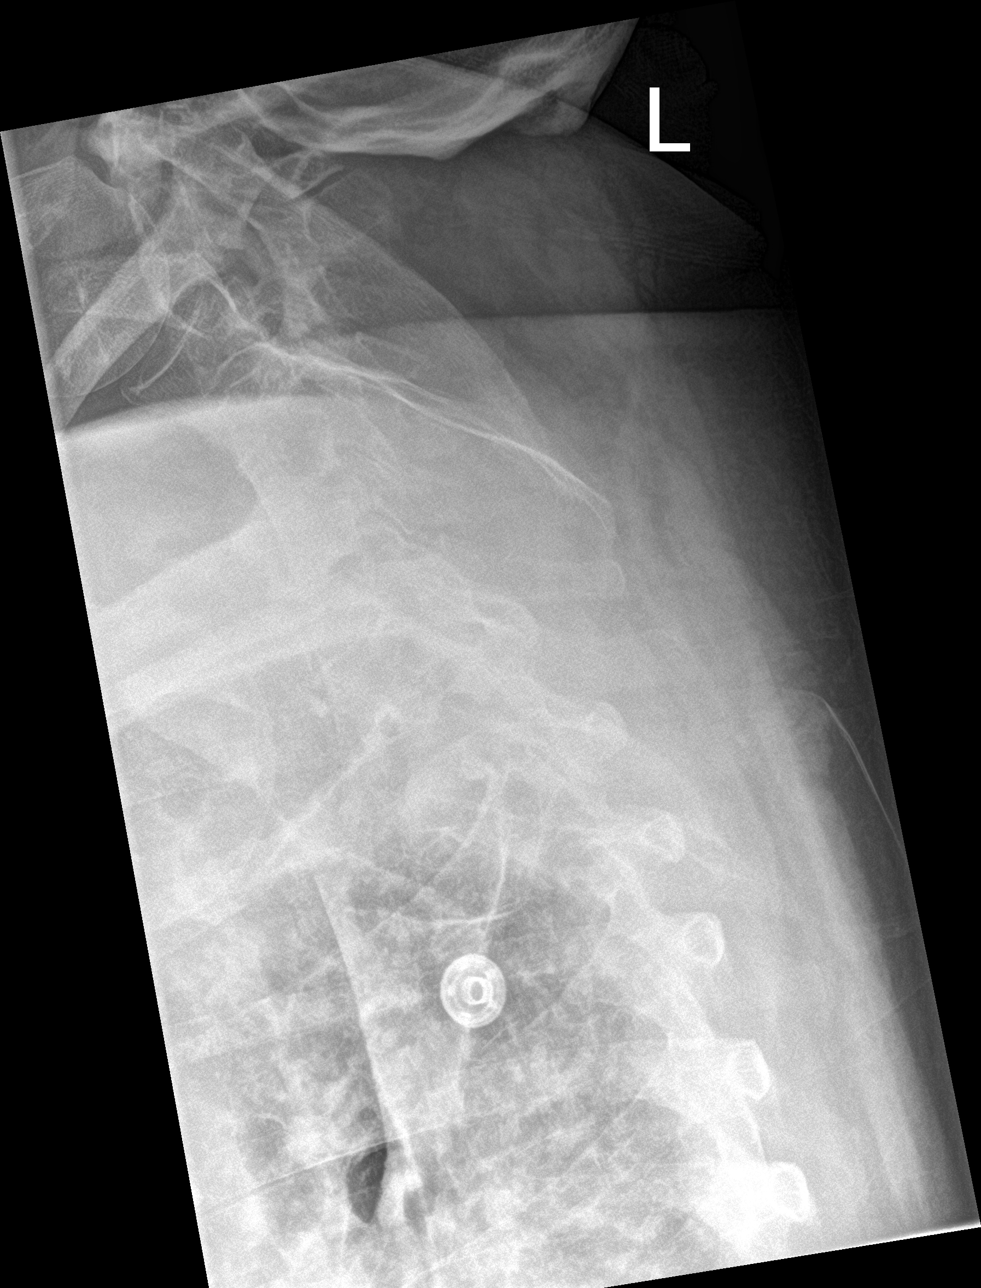

[4 of 4 positions shown; findings below may reference images not displayed]

FINDINGS: There is no evidence of thoracic spine fracture. Alignment is
normal. No other significant bone abnormalities are identified.
IMPRESSION: Negative.

## 2019-11-29 IMAGING — DX PORTABLE CHEST - 1 VIEW
1 series · 1 of 1 positions shown · non-contrast
Comparison: April 06, 2019

CLINICAL DATA: Status post fall.

EXAM:
PORTABLE CHEST 1 VIEW

[chest ap]
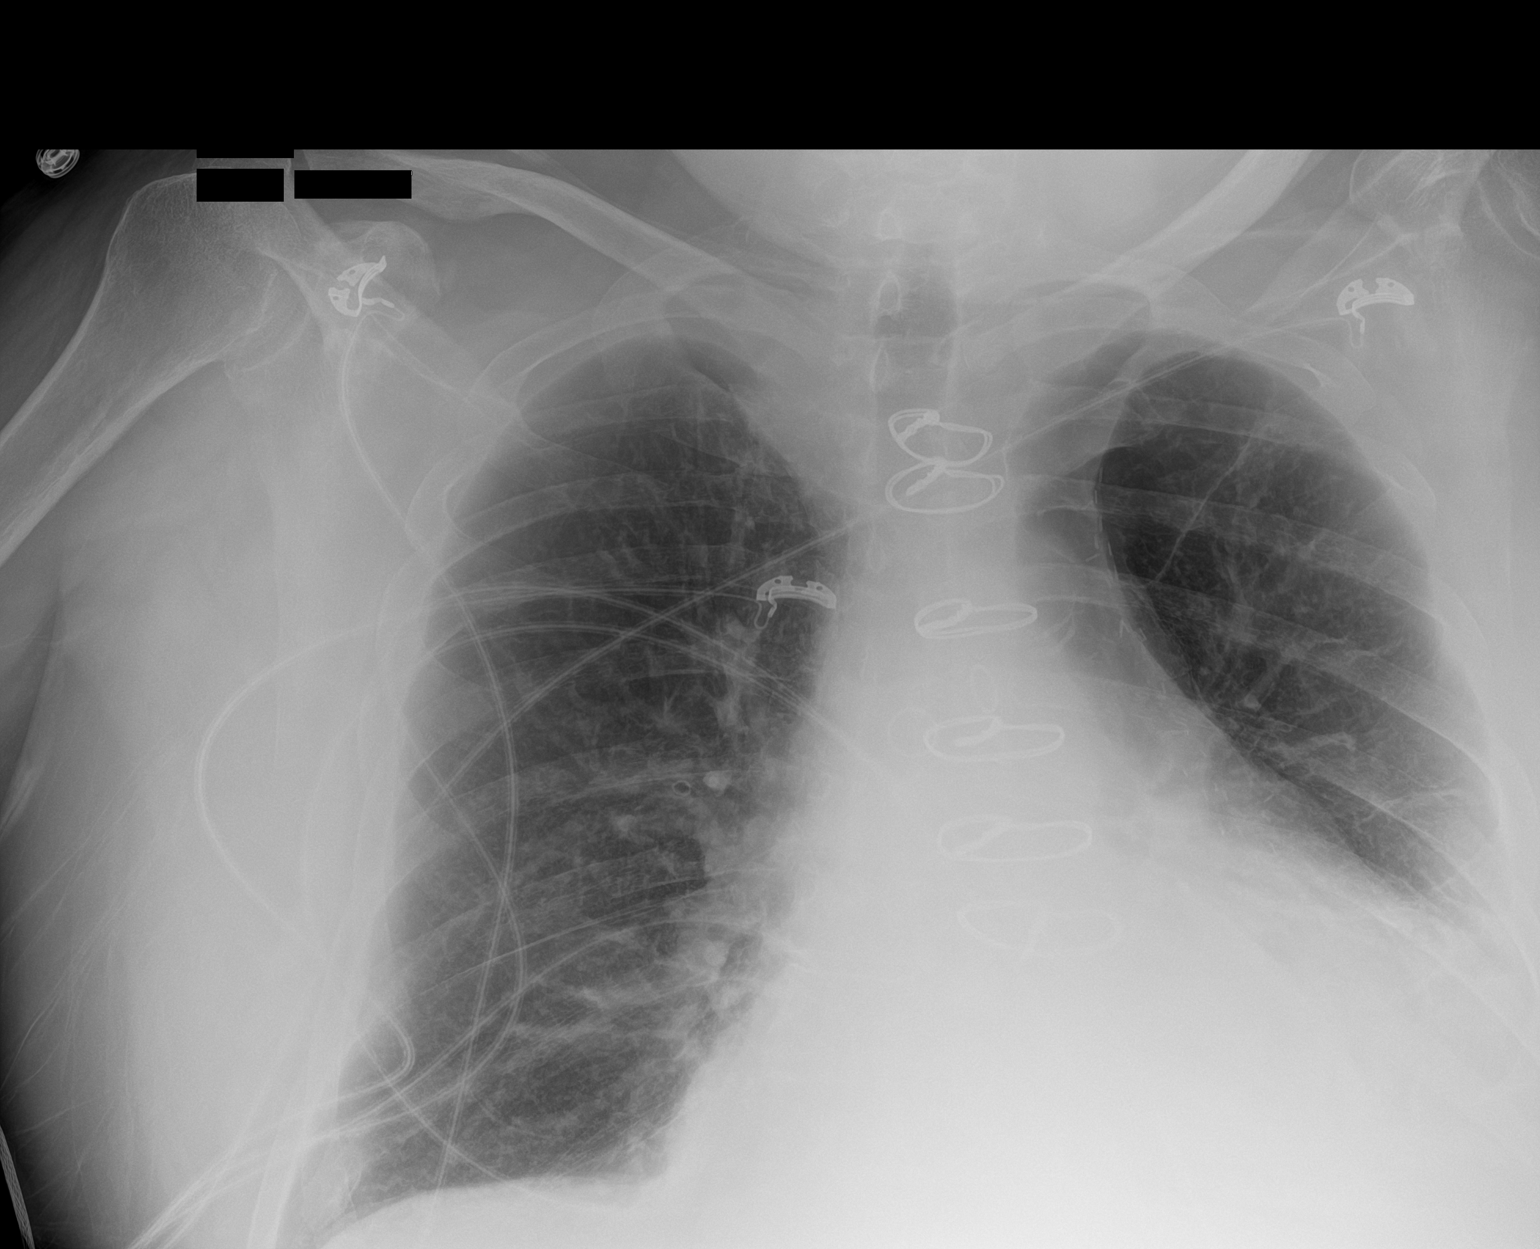

[1 of 1 positions shown; findings below may reference images not displayed]

FINDINGS: Postsurgical changes from CABG.

Cardiomediastinal silhouette is normal. Mediastinal contours appear
intact. Known hiatal hernia.

There is no evidence of focal airspace consolidation, pleural
effusion or pneumothorax. Left lower lobe atelectasis.

Osseous structures are without acute abnormality. Soft tissues are
grossly normal.
IMPRESSION: Left lower lobe atelectasis.

## 2019-11-30 IMAGING — DX CHEST - 2 VIEW
3 series · 3 of 3 positions shown · non-contrast
Comparison: 04/27/2019 and earlier exams.

CLINICAL DATA: Fever.  Chest congestion.

EXAM:
CHEST - 2 VIEW

[chest lat (1 of 2)]
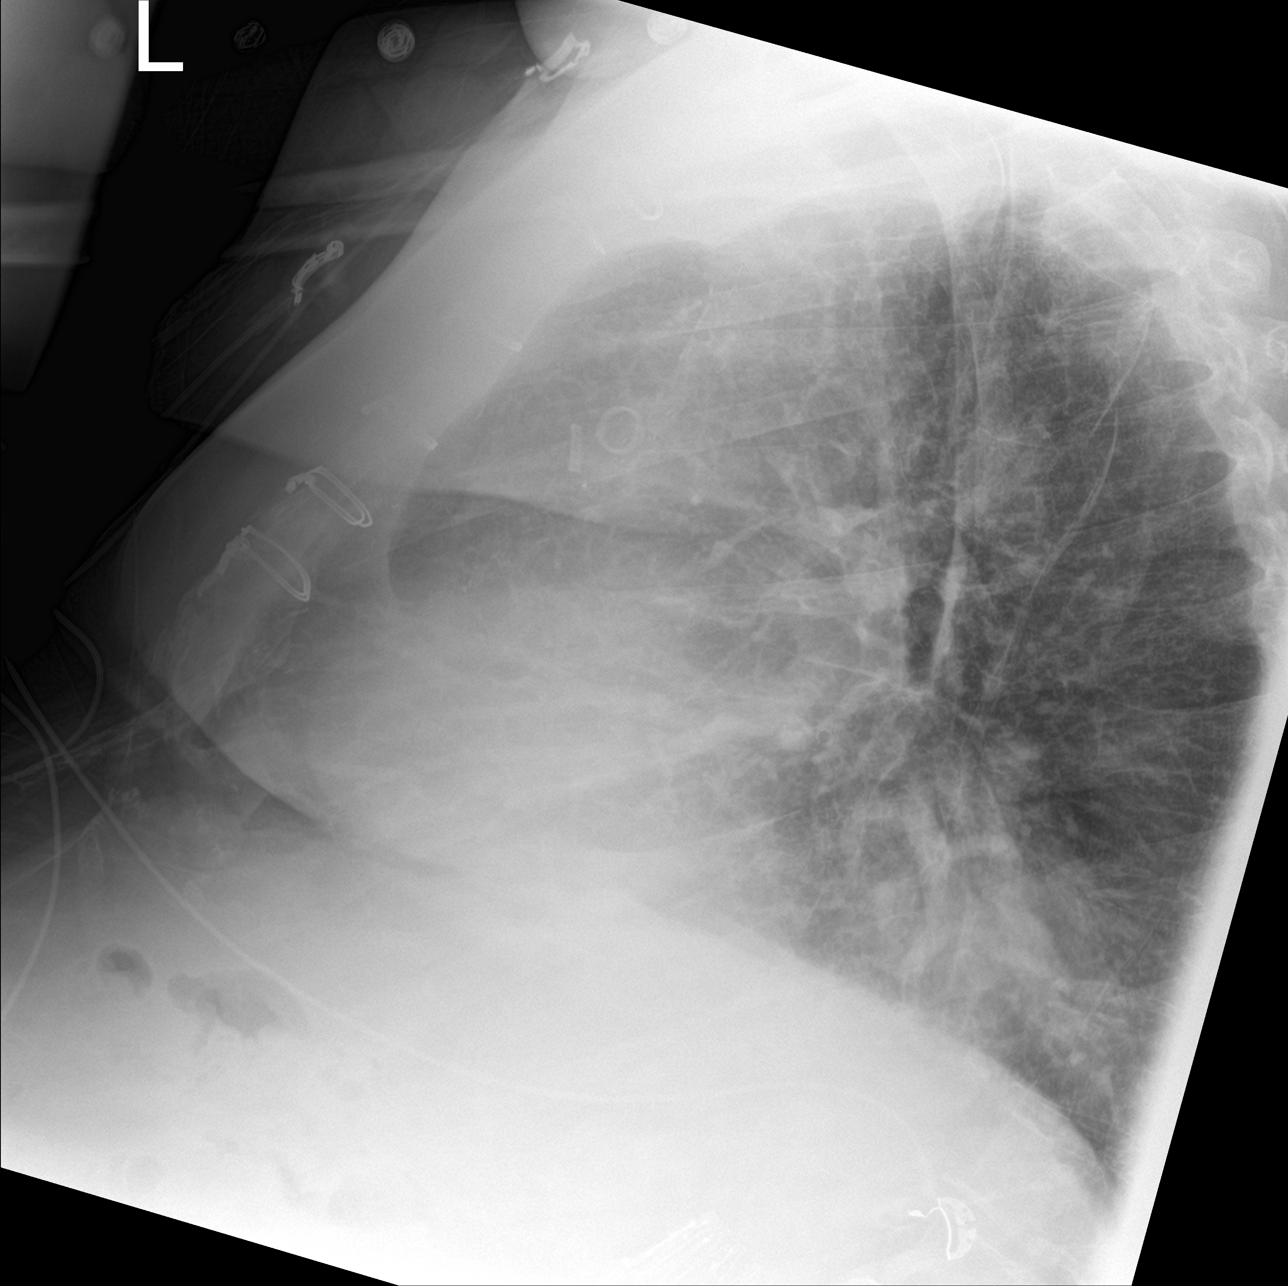

[chest ap]
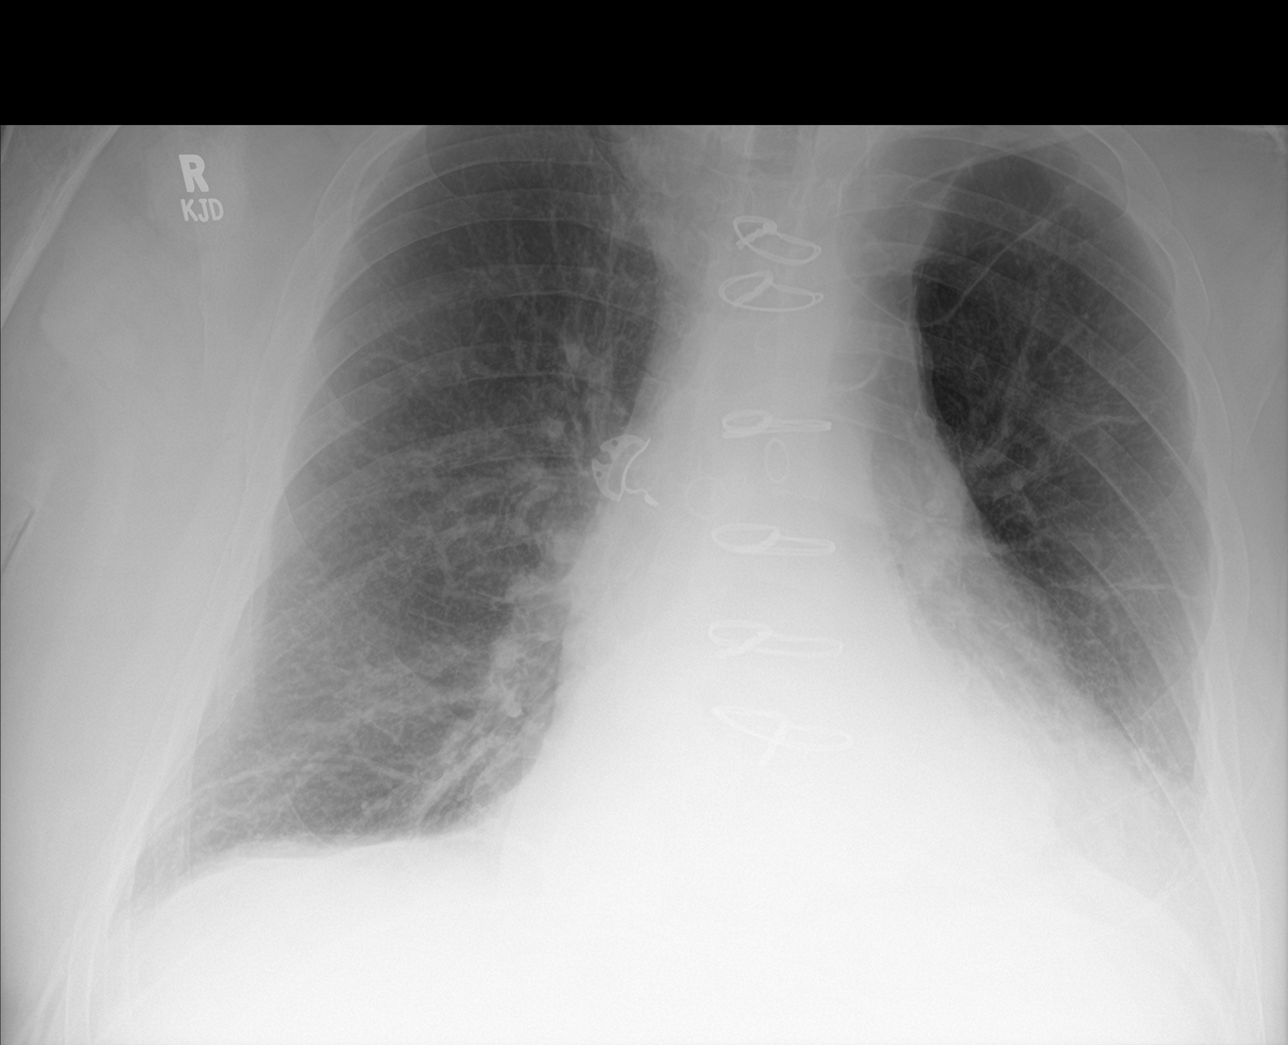

[chest lat (2 of 2)]
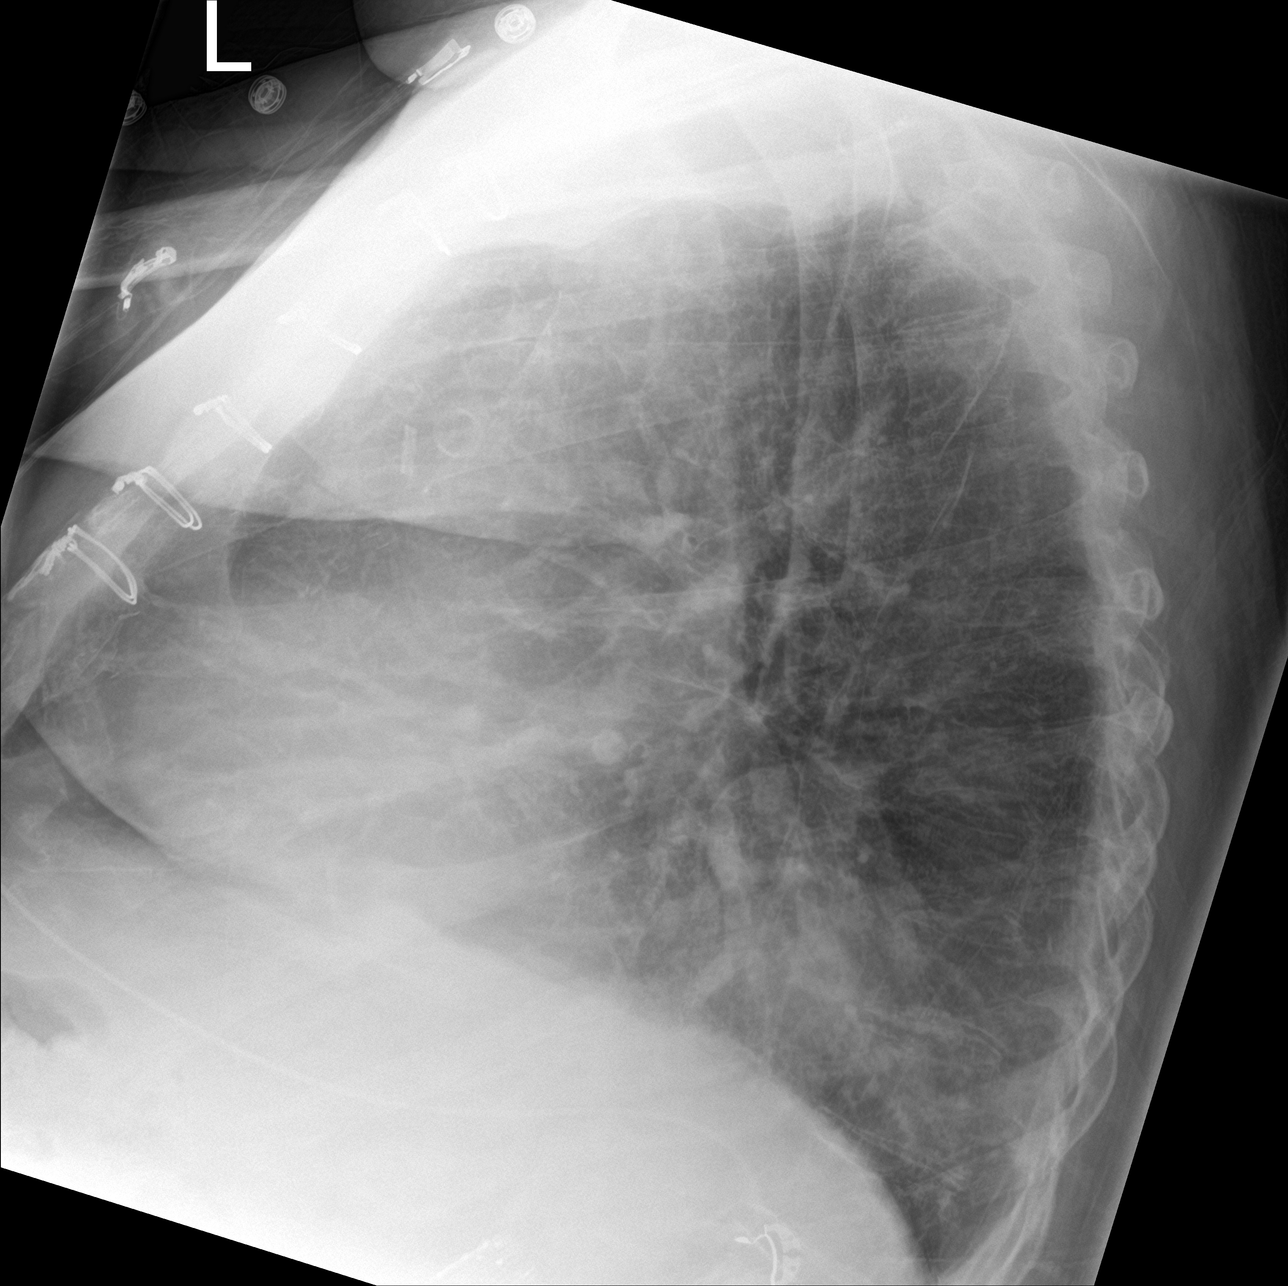

[3 of 3 positions shown; findings below may reference images not displayed]

FINDINGS: Cardiac silhouette is normal in size. Changes from CABG surgery are
stable. Stable hiatal hernia. No mediastinal or hilar masses.

Lungs are hyperexpanded. Left base atelectasis. No evidence of
pneumonia or pulmonary edema. No convincing pleural effusion and no
pneumothorax.

Skeletal structures are demineralized but grossly intact.
IMPRESSION: 1. No acute cardiopulmonary disease.
2. Left lung base opacity is stable consistent with atelectasis.

## 2019-11-30 IMAGING — DX ABDOMEN - 1 VIEW
2 series · 2 of 2 positions shown · non-contrast
Comparison: Prior CT from 06/28/2017

CLINICAL DATA: Initial evaluation for acute fever, constipation.

EXAM:
ABDOMEN - 1 VIEW

[abdomen kub (1 of 2)]
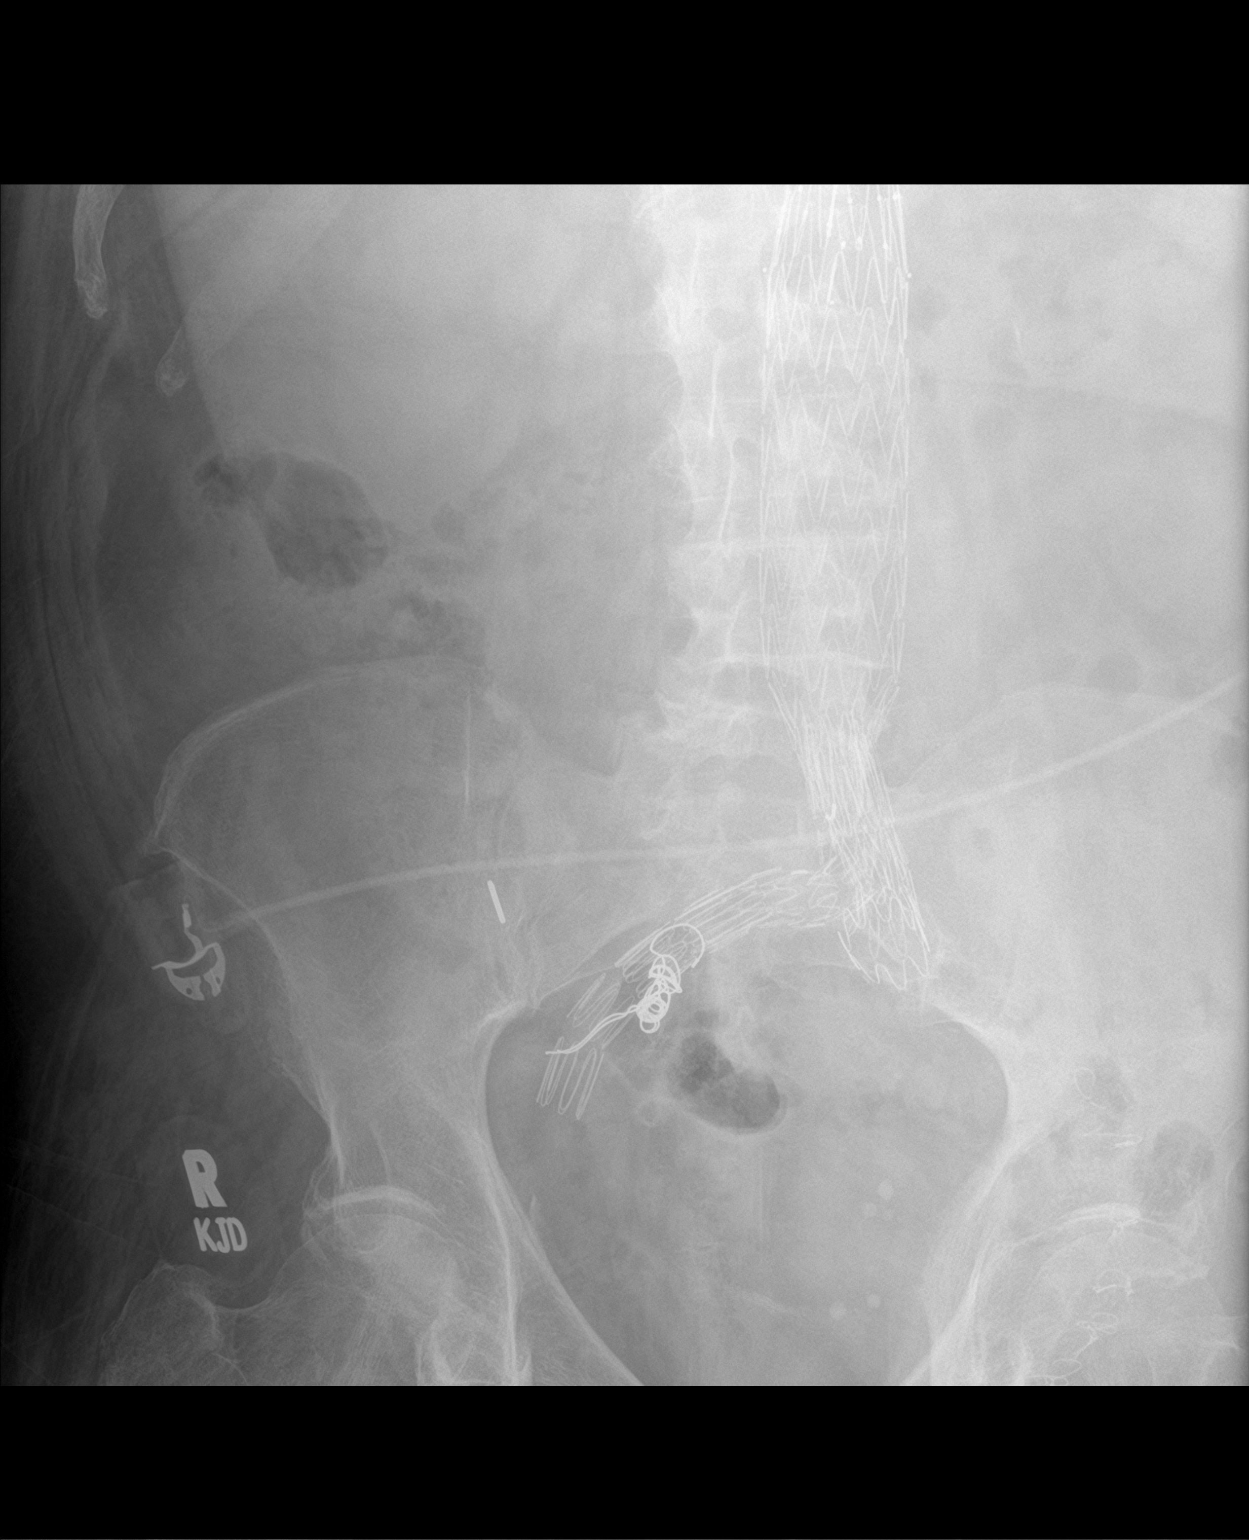

[abdomen kub (2 of 2)]
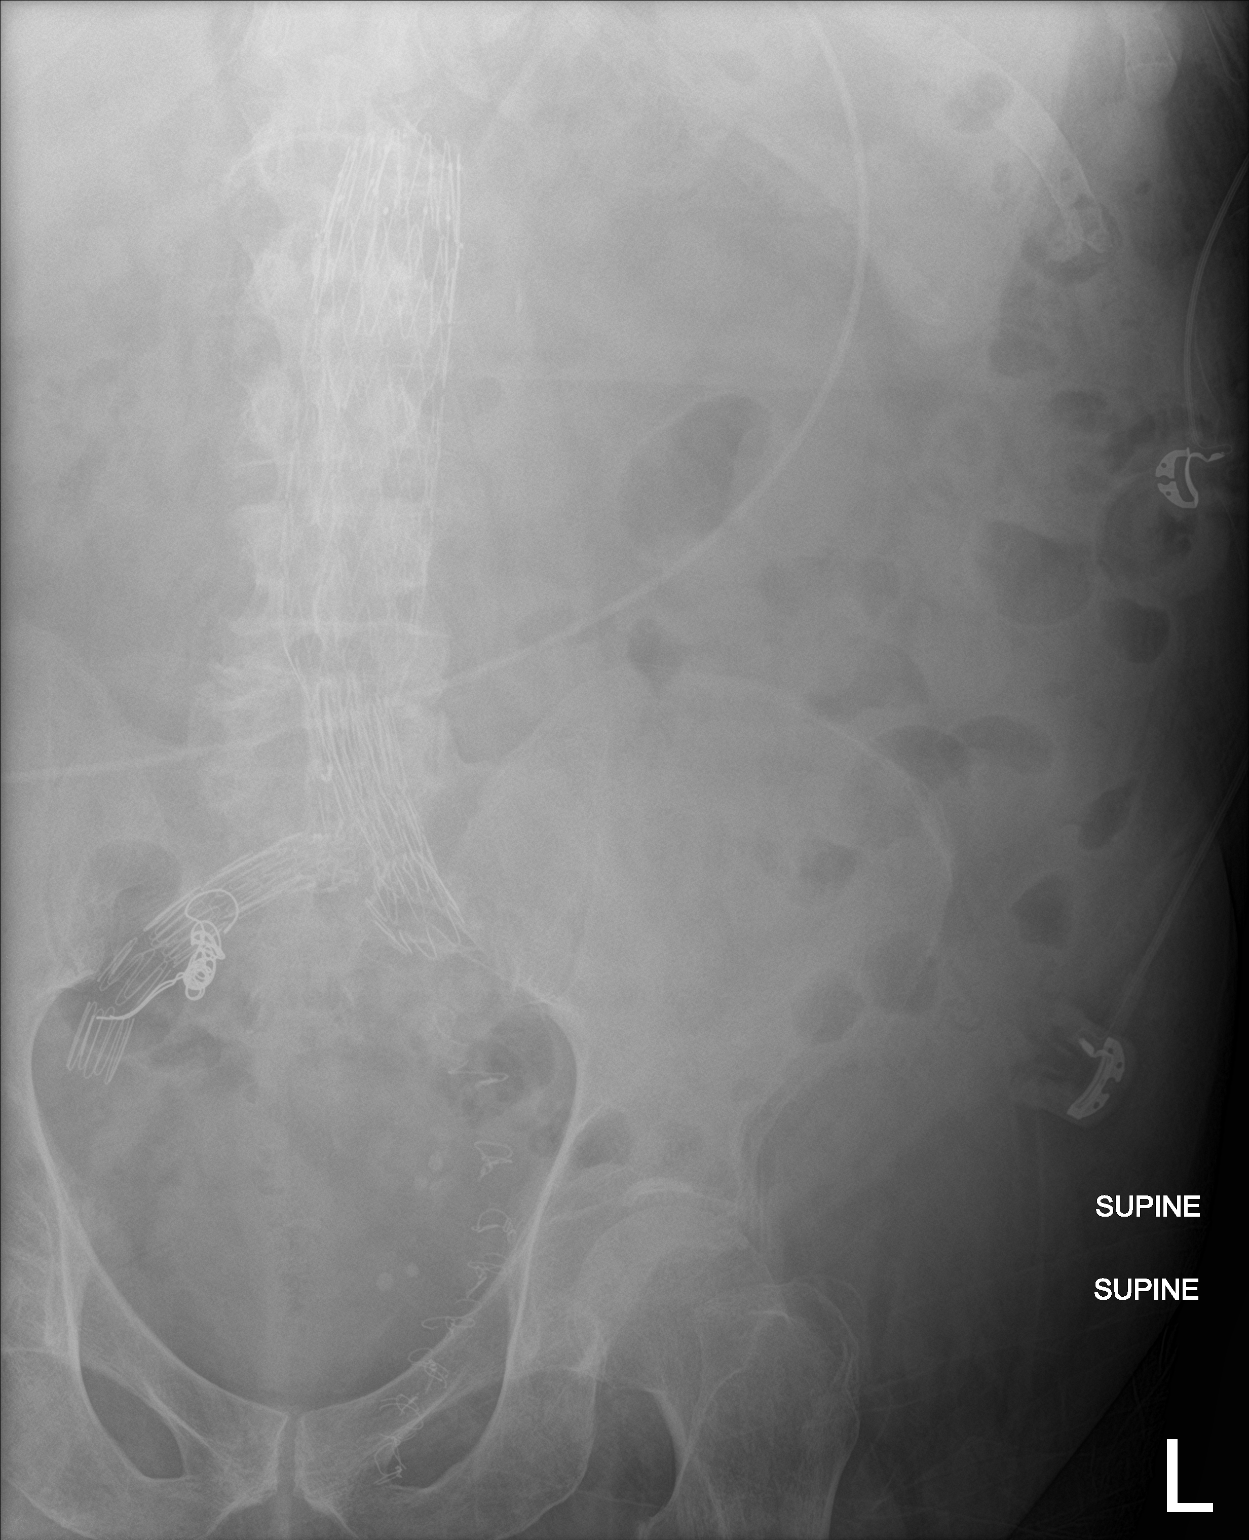

[2 of 2 positions shown; findings below may reference images not displayed]

FINDINGS: Visualized bowel gas pattern within normal limits without evidence
for obstruction or ileus. No appreciable abnormal bowel wall
thickening. Overall stool burden is moderate in nature.

No soft tissue mass or abnormal calcification. Aorto bi-iliac stent
endograft in place. Suture material overlies the left pelvis.

No acute osseous finding.
IMPRESSION: 1. Nonobstructive bowel gas pattern with no radiographic evidence
for acute intra-abdominal process.
2. Moderate stool burden.

## 2019-12-07 IMAGING — CT CT HEAD WITHOUT CONTRAST
4 of 8 series · 15 of 47 positions shown, 17 images · non-contrast
Comparison: Head CT dated 05/19/2015.

CLINICAL DATA: Fall this morning, LEFT-sided facial droop onset
yesterday.

EXAM:
CT HEAD WITHOUT CONTRAST
CT CERVICAL SPINE WITHOUT CONTRAST
TECHNIQUE: Multidetector CT imaging of the head and cervical spine was
performed following the standard protocol without intravenous
contrast. Multiplanar CT image reconstructions of the cervical spine
were also generated.

[Series 5: head bone · axial · 0.46mm/px · z∈[-166,-114]mm · 3 of 94 slices shown]
[im 14/94  bone]
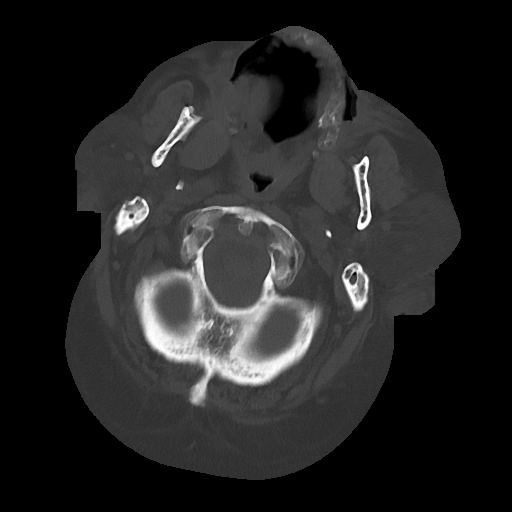
[im 27/94  bone]
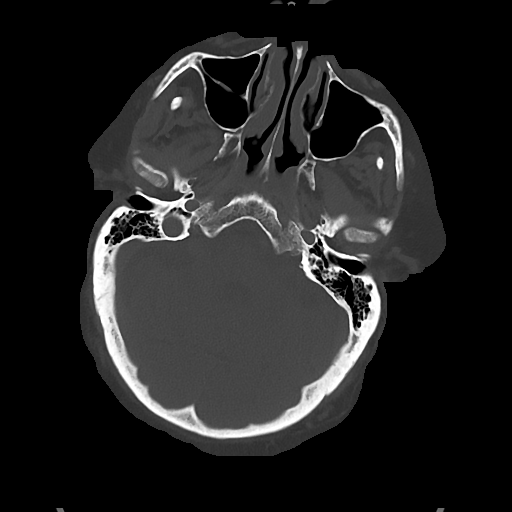
[im 40/94  bone]
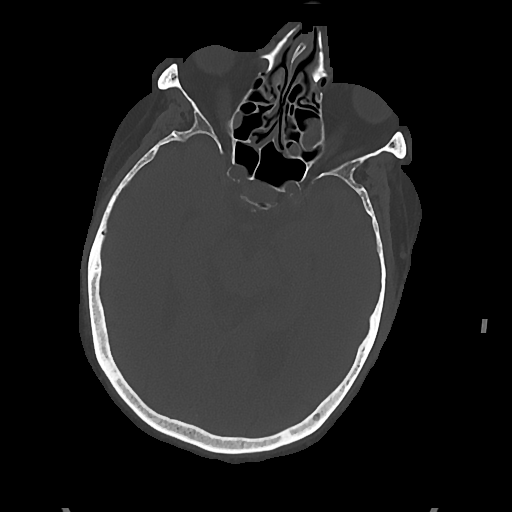

[Series 6: cor soft · coronal · 0.36mm/px · 3 of 83 slices shown]
[im 31/83  brain]
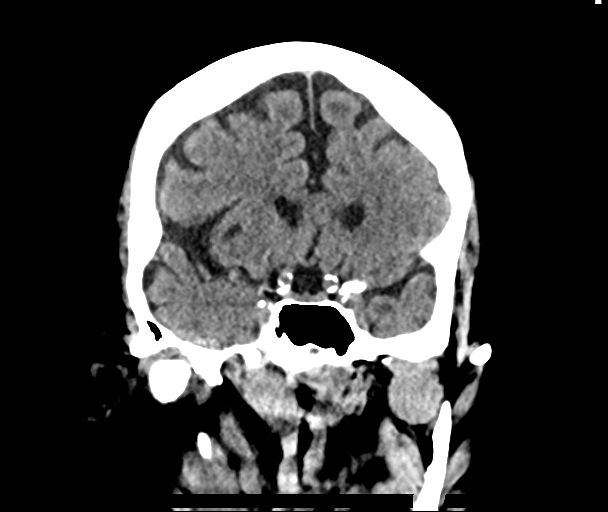
[im 42/83  brain]
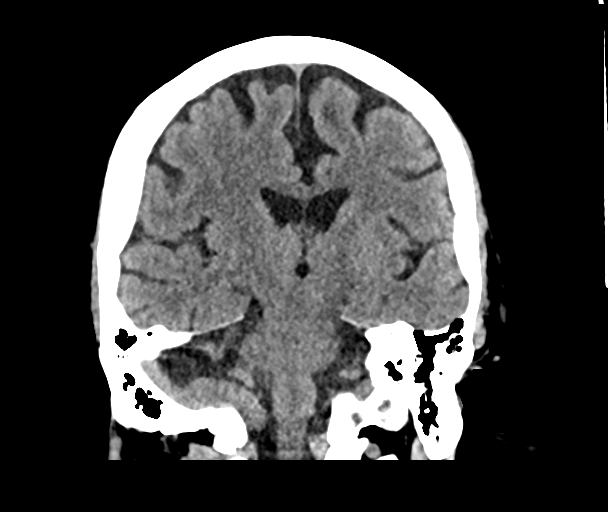
[im 52/83  brain]
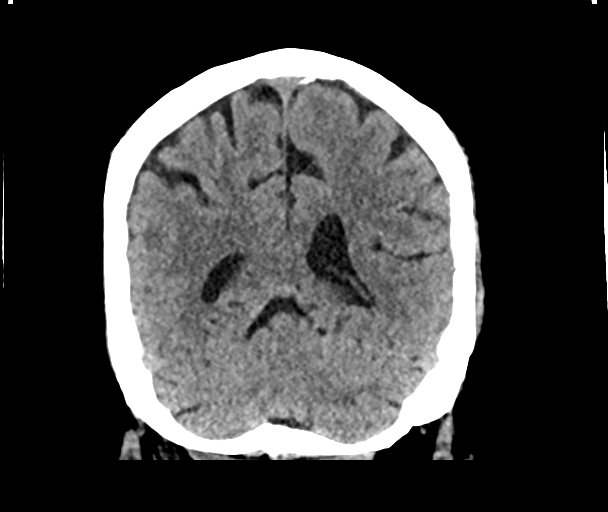

[Series 7: sag soft · sagittal · 0.36mm/px · 2 of 67 slices shown]
[im 23/67  brain]
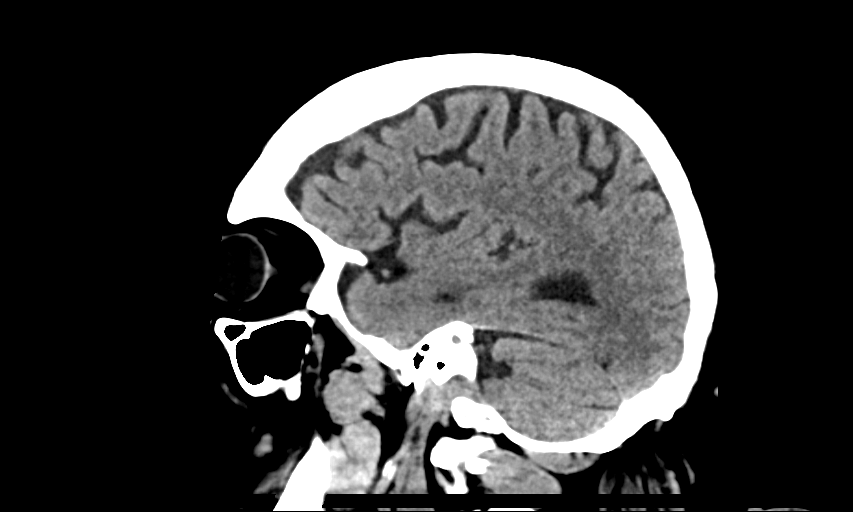
[im 45/67  brain]
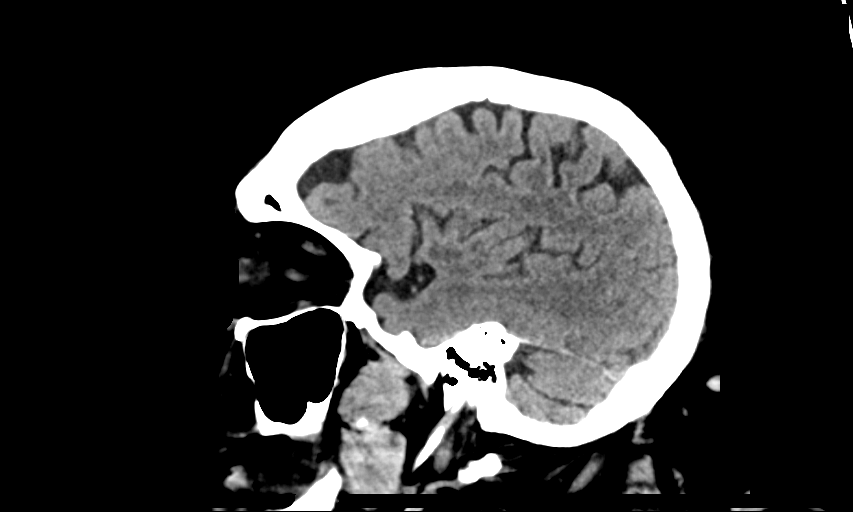

[Series 12: orthogonal axials · axial · 0.21mm/px · z∈[-332,-186]mm · 7 of 111 slices shown, 9 images]
[im 14/111  brain]
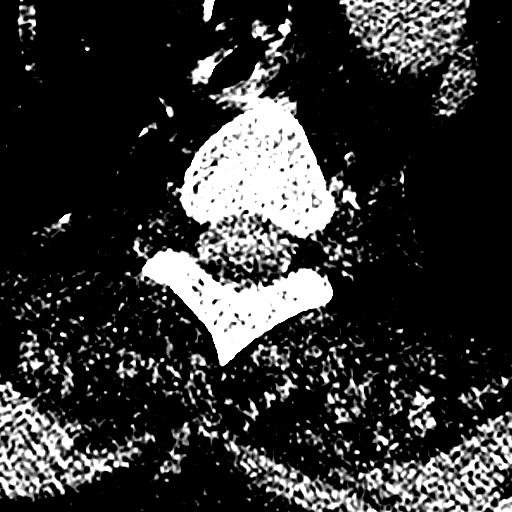
[im 14/111  bone]
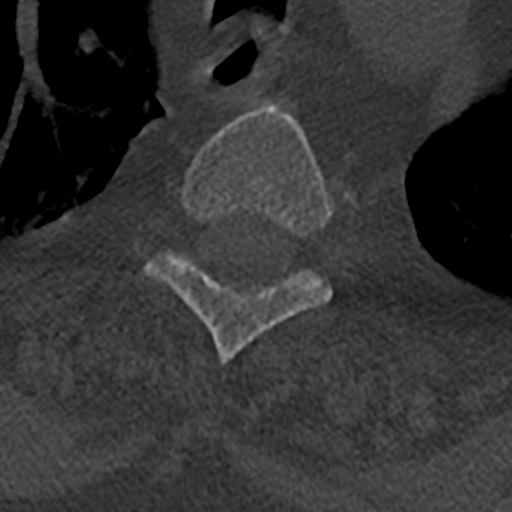
[im 28/111  brain]
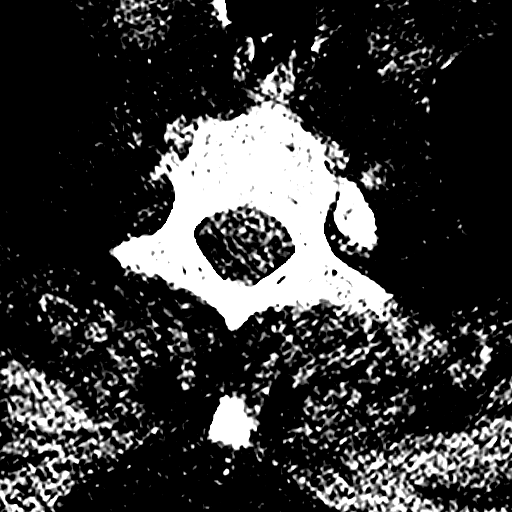
[im 42/111  brain]
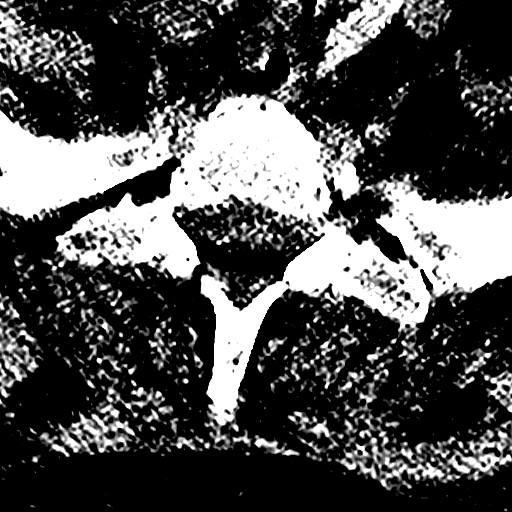
[im 56/111  brain]
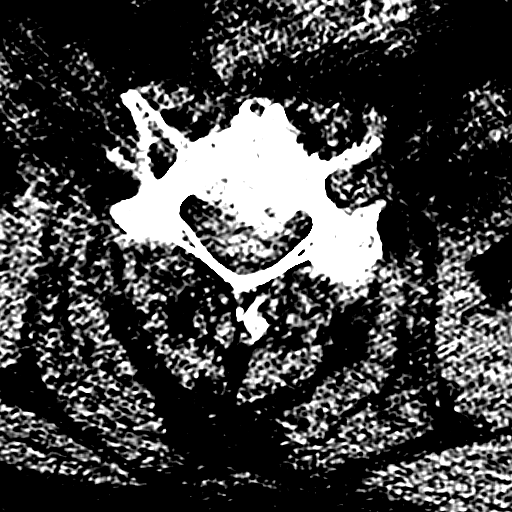
[im 69/111  brain]
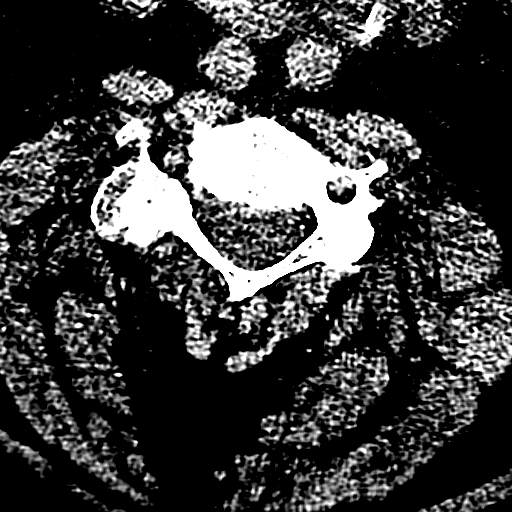
[im 69/111  bone]
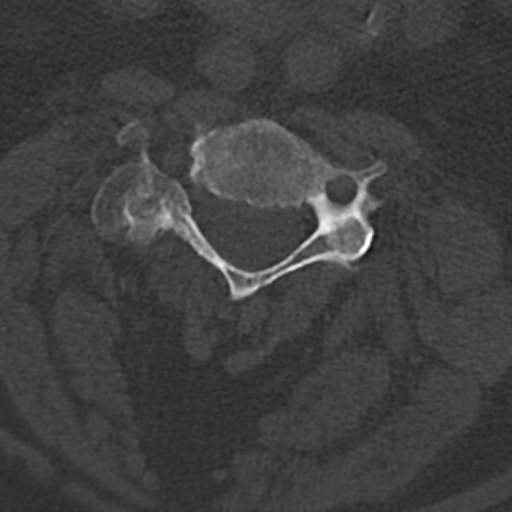
[im 83/111  brain]
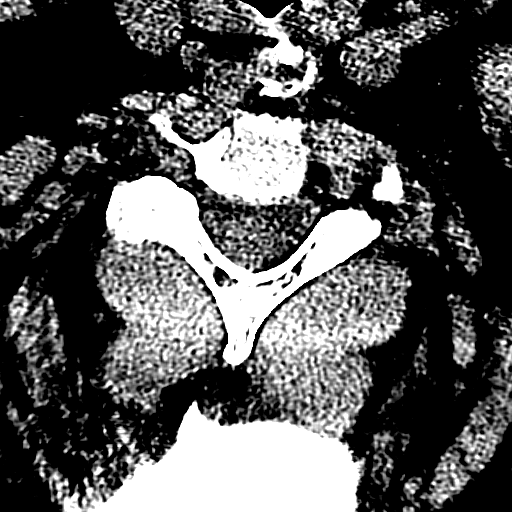
[im 97/111  brain]
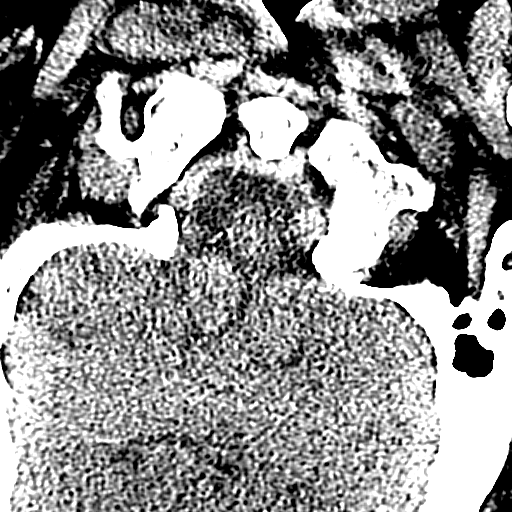

[15 of 47 positions shown; findings below may reference images not displayed]

FINDINGS: CT HEAD FINDINGS

Brain: Generalized age related parenchymal volume loss with
commensurate dilatation of the ventricles and sulci. No mass,
hemorrhage, edema or other evidence of acute parenchymal
abnormality. No extra-axial hemorrhage.

Vascular: Chronic calcified atherosclerotic changes of the large
vessels at the skull base. No unexpected hyperdense vessel.

Skull: Normal. Negative for fracture or focal lesion.

Sinuses/Orbits: No acute finding.

Other: None.

CT CERVICAL SPINE FINDINGS

Alignment: Mild scoliosis. Slight reversal of the normal cervical
spine lordosis. No evidence of acute vertebral body subluxation.

Skull base and vertebrae: No fracture line or displaced fracture
fragment seen. Facets are normally aligned throughout.

Soft tissues and spinal canal: No prevertebral fluid or swelling. No
visible canal hematoma.

Disc levels: Degenerative spondylosis throughout the mid and lower
cervical spine, mild to moderate in degree. Associated mild to
moderate central canal stenoses at the C4-5 through C6-7 levels.

Upper chest: Prominent emphysematous bleb at the LEFT lung apex.
Chronic scarring/fibrosis at the lung apices. No acute findings.

Other: Bilateral carotid atherosclerosis.
IMPRESSION: 1. No acute intracranial abnormality. No intracranial mass,
hemorrhage or edema. No skull fracture.
2. No fracture or acute subluxation within the cervical spine.
Degenerative changes within the mid and lower cervical spine, as
detailed above.
3. Carotid atherosclerosis.

## 2020-01-23 IMAGING — DX DG CHEST 1V PORT
1 series · 1 of 1 positions shown · non-contrast
Comparison: Radiograph 05/05/2019, CT 03/30/2019

CLINICAL DATA: Shortness of breath

EXAM:
PORTABLE CHEST 1 VIEW

[chest ap]
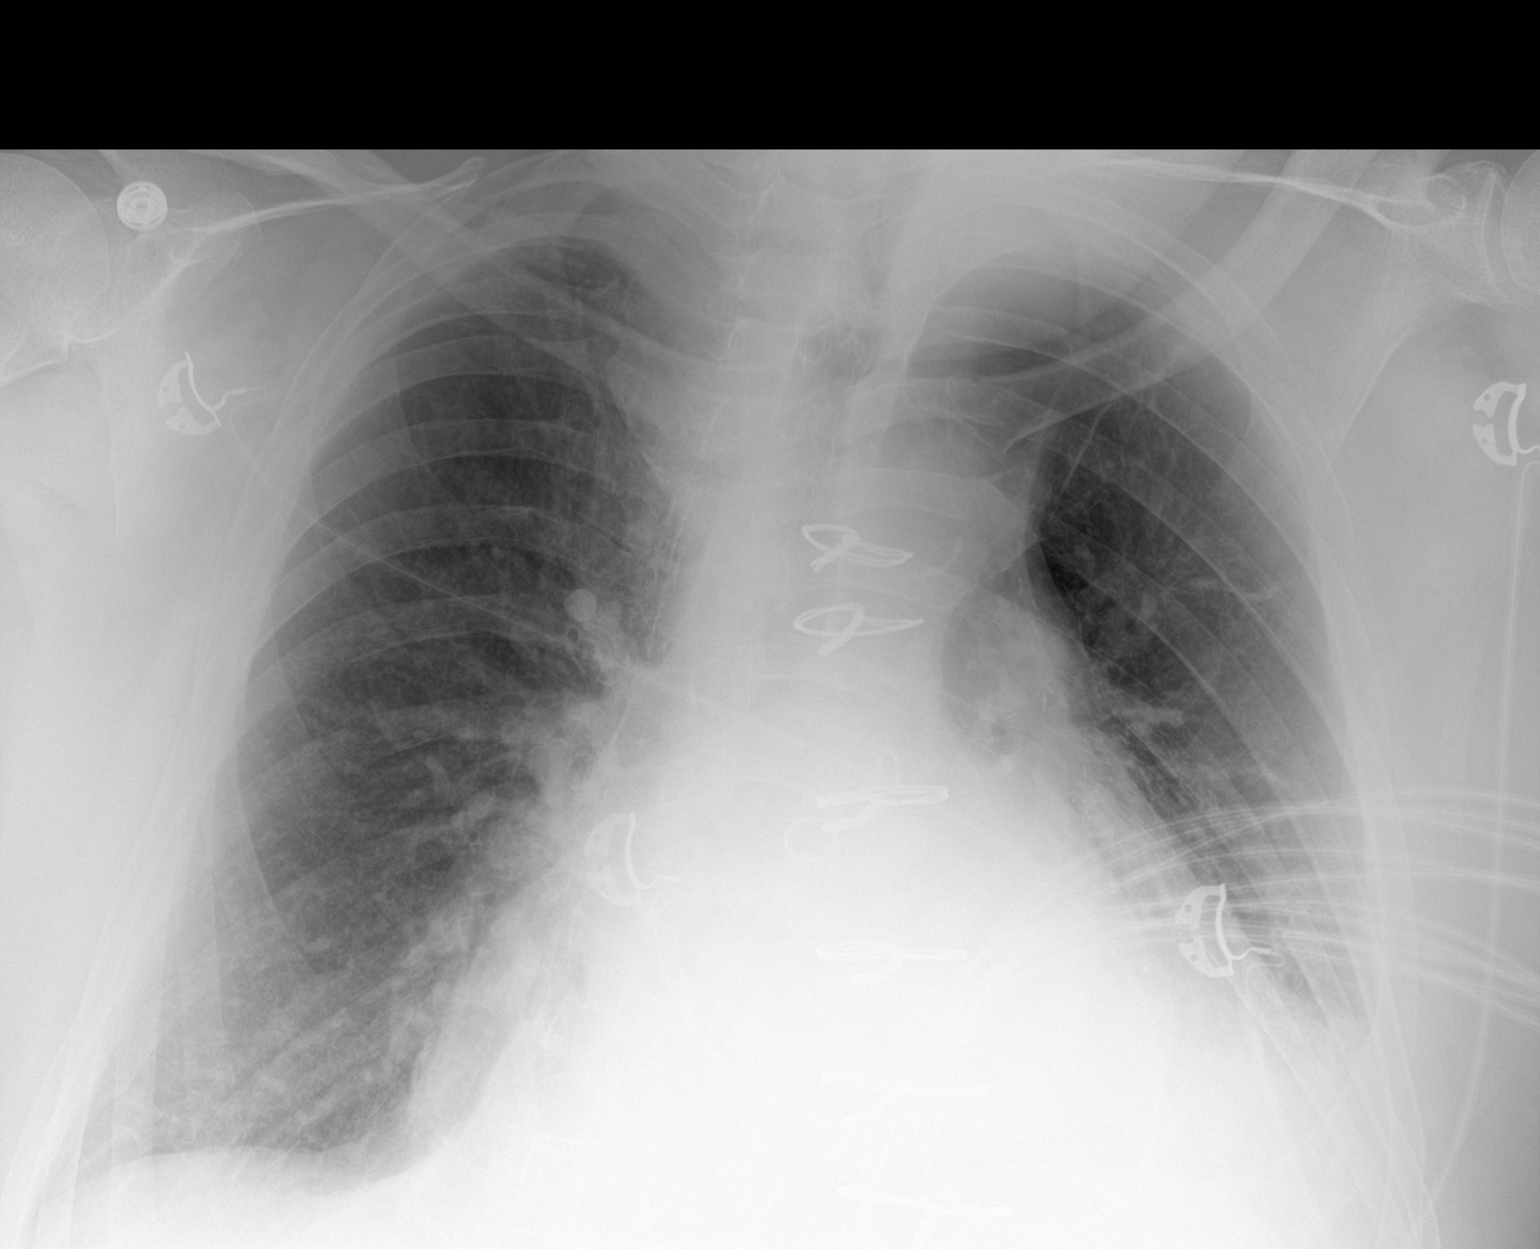

[1 of 1 positions shown; findings below may reference images not displayed]

FINDINGS: Basilar predominant interstitial opacities with septal lines,
central cuffing and venous congestion. Increased attenuation in the
retrocardiac space is likely accentuated by a large hiatal hernia
seen on prior studies. Cannot exclude underlying consolidation.
Postsurgical changes related to prior CABG including intact and
aligned sternotomy wires and multiple surgical clips projecting over
the mediastinum. No acute osseous or soft tissue abnormality.
IMPRESSION: 1. Basilar predominant interstitial opacities, central cuffing and
venous congestion, concerning for pulmonary edema.
2. Increased attenuation in the retrocardiac space is likely
accentuated by a large hiatal hernia seen on prior studies. Cannot
exclude underlying consolidation.

## 2020-01-26 IMAGING — DX DG CHEST 1V
1 series · 1 of 1 positions shown · non-contrast
Comparison: 06/21/2019 and CT from 03/30/2019

CLINICAL DATA: Cough.

EXAM:
CHEST  1 VIEW

[chest ap]
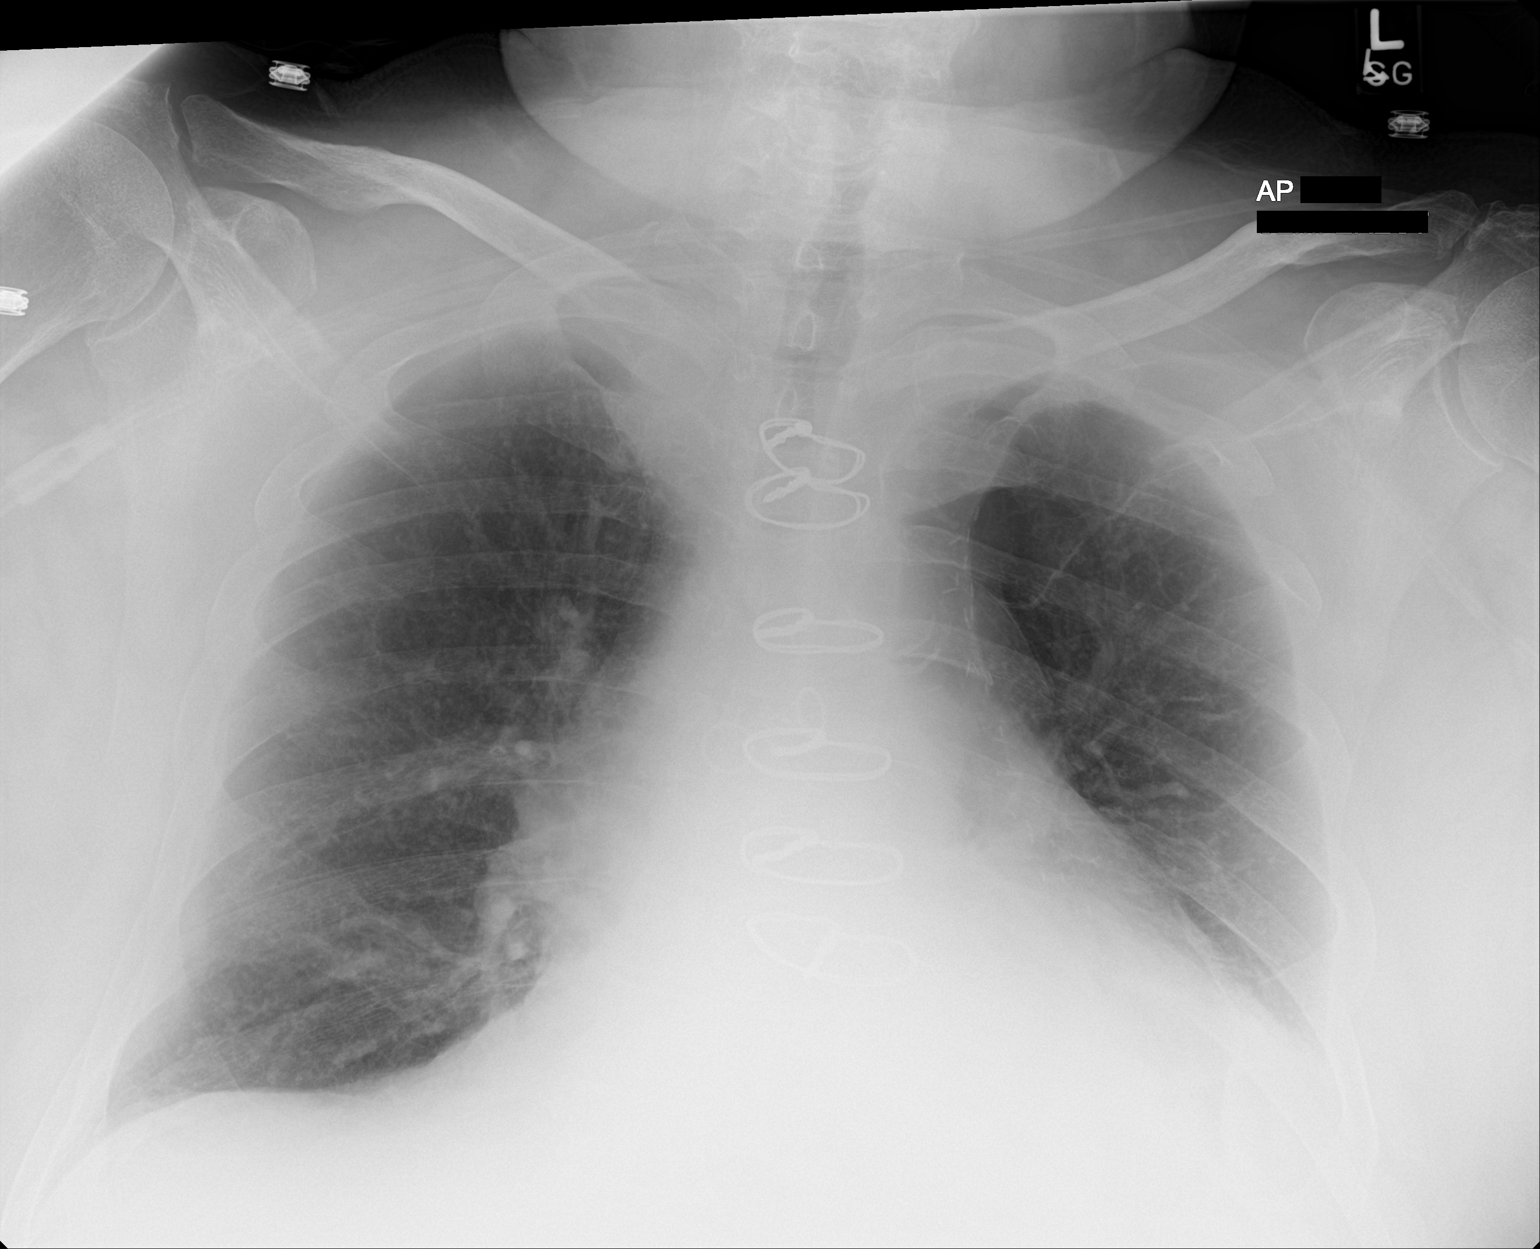

[1 of 1 positions shown; findings below may reference images not displayed]

FINDINGS: Cardiac silhouette remains prominent for size but unchanged.
Evidence for median sternotomy and prior CABG procedure. Chronic
densities in the retrocardiac space related to large hiatal hernia
and limited evaluation of this area on this single AP view.
Visualized lungs are similar to the previous examination with
central vascular prominence. No frank pulmonary edema.
IMPRESSION: Chronic densities at the left lung base that are poorly
characterized on this examination. Some of these densities are
related to the hiatal hernia.

Slightly prominent central vascular structures are unchanged. No
frank pulmonary edema.

## 2020-02-06 IMAGING — CR DG CHEST 2V
2 series · 2 of 2 positions shown · non-contrast
Comparison: 06/24/2019

CLINICAL DATA: Shortness of breath

EXAM:
CHEST - 2 VIEW

[w chest lat]
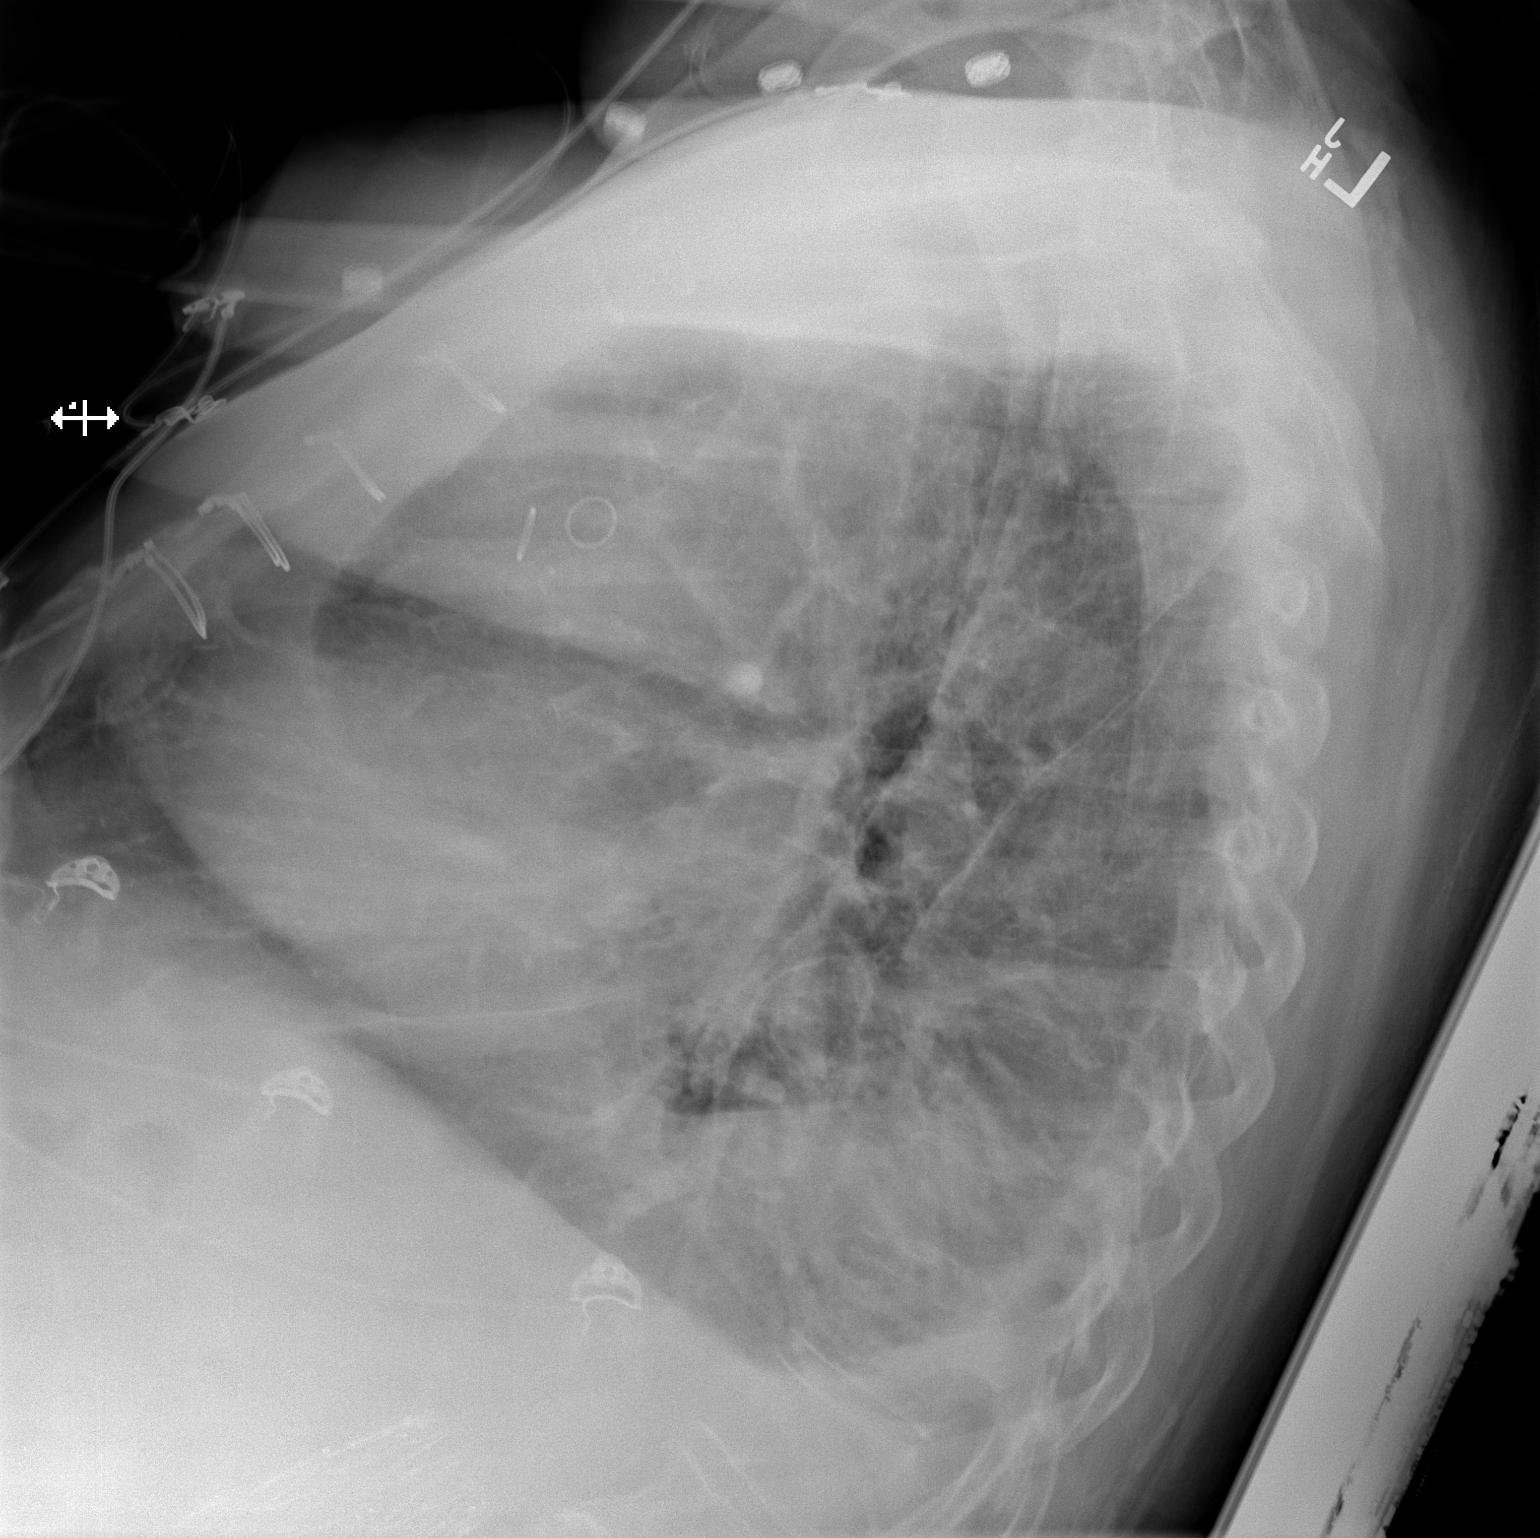

[x chest ap]
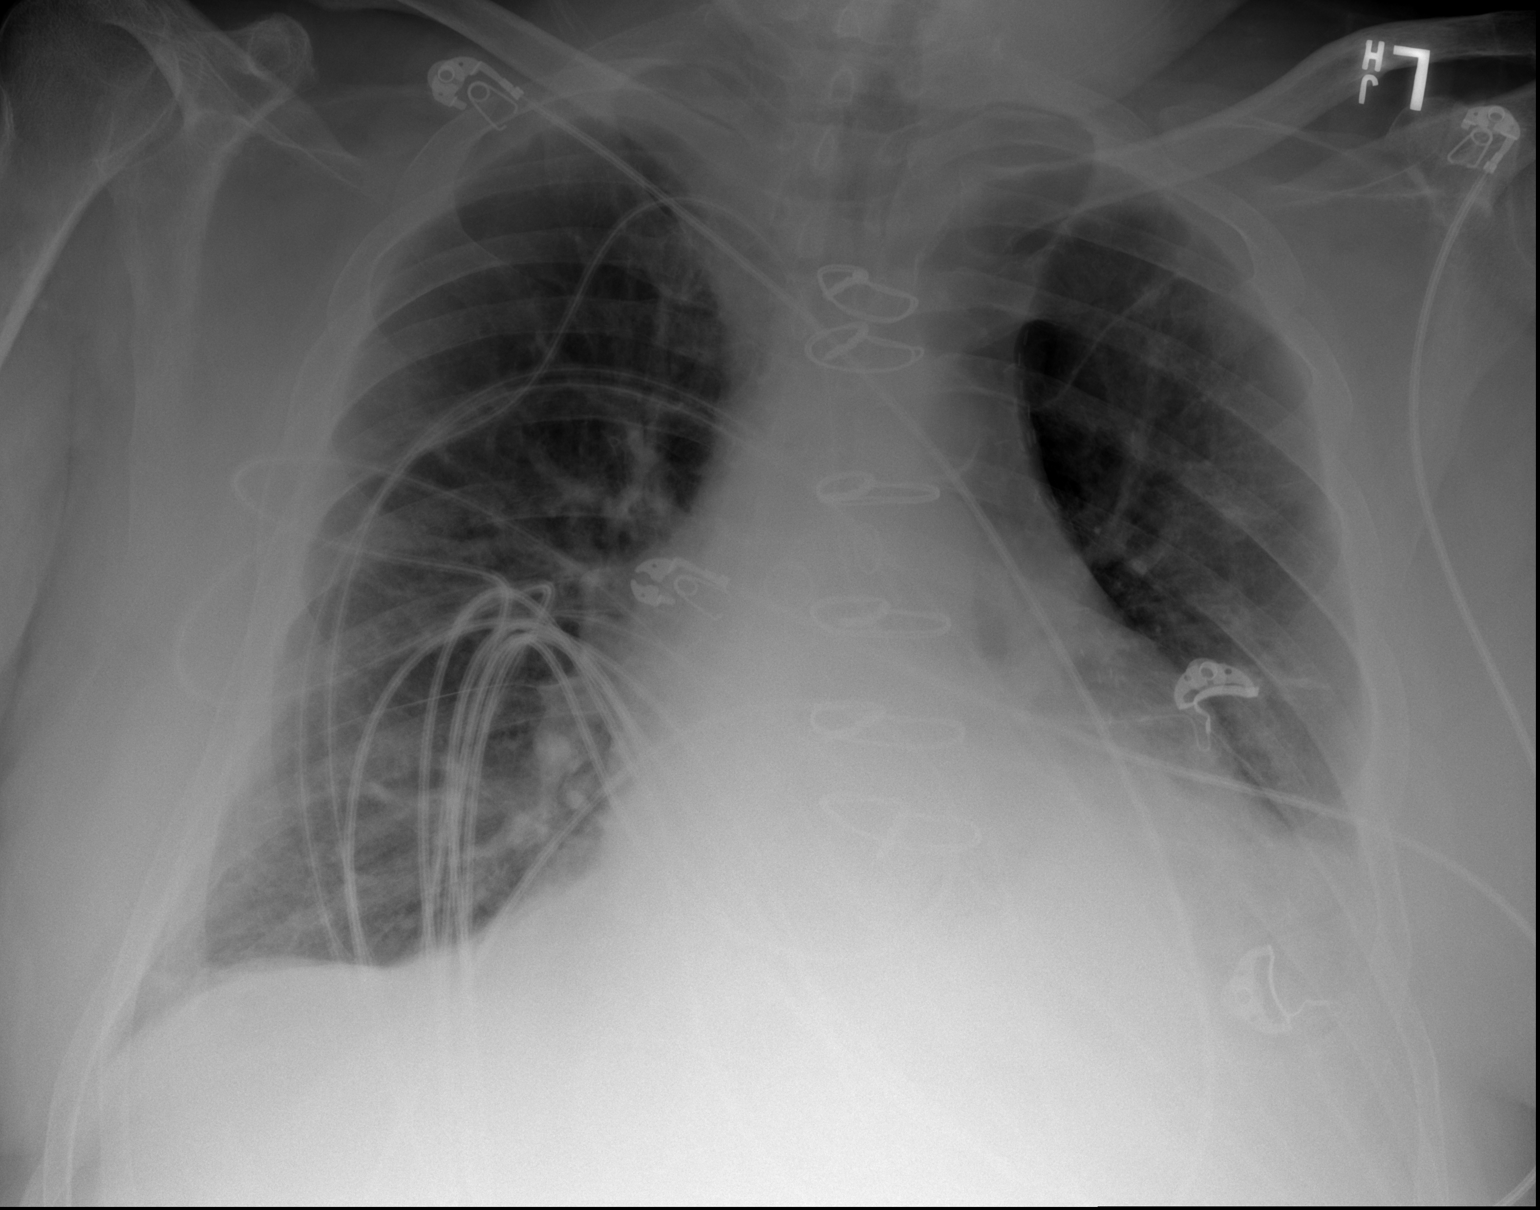

[2 of 2 positions shown; findings below may reference images not displayed]

FINDINGS: Cardiomegaly status post median sternotomy. Underpenetrated frontal
view. Unchanged small bilateral pleural effusions, left greater than
right. No new or focal airspace opacity. The visualized skeletal
structures are unremarkable.
IMPRESSION: Underpenetrated frontal view. Unchanged small bilateral pleural
effusions, left greater than right. No new or focal airspace
opacity. Cardiomegaly.

## 2020-02-25 ENCOUNTER — Observation Stay (HOSPITAL_COMMUNITY)
Admission: EM | Admit: 2020-02-25 | Discharge: 2020-02-25 | Discharge status: Against Medical Advice | Payer: Medicare HMO | Attending: Internal Medicine | Admitting: Internal Medicine

## 2020-02-25 ENCOUNTER — Encounter (HOSPITAL_COMMUNITY): Payer: Self-pay | Admitting: Emergency Medicine

## 2020-02-25 ENCOUNTER — Emergency Department (HOSPITAL_COMMUNITY): Payer: Medicare HMO

## 2020-02-25 ENCOUNTER — Other Ambulatory Visit: Payer: Self-pay

## 2020-02-25 DIAGNOSIS — K219 Gastro-esophageal reflux disease without esophagitis: Secondary | ICD-10-CM | POA: Diagnosis not present

## 2020-02-25 DIAGNOSIS — Z87891 Personal history of nicotine dependence: Secondary | ICD-10-CM | POA: Insufficient documentation

## 2020-02-25 DIAGNOSIS — M7989 Other specified soft tissue disorders: Principal | ICD-10-CM | POA: Insufficient documentation

## 2020-02-25 DIAGNOSIS — I2510 Atherosclerotic heart disease of native coronary artery without angina pectoris: Secondary | ICD-10-CM | POA: Insufficient documentation

## 2020-02-25 DIAGNOSIS — J9611 Chronic respiratory failure with hypoxia: Secondary | ICD-10-CM

## 2020-02-25 DIAGNOSIS — R300 Dysuria: Secondary | ICD-10-CM | POA: Diagnosis not present

## 2020-02-25 DIAGNOSIS — Z951 Presence of aortocoronary bypass graft: Secondary | ICD-10-CM | POA: Diagnosis not present

## 2020-02-25 DIAGNOSIS — Z20822 Contact with and (suspected) exposure to covid-19: Secondary | ICD-10-CM | POA: Insufficient documentation

## 2020-02-25 DIAGNOSIS — K922 Gastrointestinal hemorrhage, unspecified: Secondary | ICD-10-CM

## 2020-02-25 DIAGNOSIS — R609 Edema, unspecified: Secondary | ICD-10-CM | POA: Diagnosis present

## 2020-02-25 DIAGNOSIS — R079 Chest pain, unspecified: Secondary | ICD-10-CM | POA: Diagnosis present

## 2020-02-25 DIAGNOSIS — Z794 Long term (current) use of insulin: Secondary | ICD-10-CM | POA: Insufficient documentation

## 2020-02-25 DIAGNOSIS — G4733 Obstructive sleep apnea (adult) (pediatric): Secondary | ICD-10-CM | POA: Insufficient documentation

## 2020-02-25 DIAGNOSIS — I5032 Chronic diastolic (congestive) heart failure: Secondary | ICD-10-CM | POA: Insufficient documentation

## 2020-02-25 DIAGNOSIS — E119 Type 2 diabetes mellitus without complications: Secondary | ICD-10-CM | POA: Diagnosis present

## 2020-02-25 DIAGNOSIS — N189 Chronic kidney disease, unspecified: Secondary | ICD-10-CM | POA: Diagnosis not present

## 2020-02-25 DIAGNOSIS — J449 Chronic obstructive pulmonary disease, unspecified: Secondary | ICD-10-CM

## 2020-02-25 DIAGNOSIS — E118 Type 2 diabetes mellitus with unspecified complications: Secondary | ICD-10-CM

## 2020-02-25 DIAGNOSIS — D649 Anemia, unspecified: Secondary | ICD-10-CM

## 2020-02-25 DIAGNOSIS — E1122 Type 2 diabetes mellitus with diabetic chronic kidney disease: Secondary | ICD-10-CM | POA: Insufficient documentation

## 2020-02-25 DIAGNOSIS — I10 Essential (primary) hypertension: Secondary | ICD-10-CM | POA: Diagnosis present

## 2020-02-25 DIAGNOSIS — N1832 Chronic kidney disease, stage 3b: Secondary | ICD-10-CM | POA: Diagnosis not present

## 2020-02-25 DIAGNOSIS — I130 Hypertensive heart and chronic kidney disease with heart failure and stage 1 through stage 4 chronic kidney disease, or unspecified chronic kidney disease: Secondary | ICD-10-CM | POA: Diagnosis not present

## 2020-02-25 DIAGNOSIS — Z79899 Other long term (current) drug therapy: Secondary | ICD-10-CM | POA: Diagnosis not present

## 2020-02-25 DIAGNOSIS — D62 Acute posthemorrhagic anemia: Secondary | ICD-10-CM | POA: Diagnosis present

## 2020-02-25 LAB — URINALYSIS, ROUTINE W REFLEX MICROSCOPIC
Bilirubin Urine: NEGATIVE
Glucose, UA: NEGATIVE mg/dL
Ketones, ur: NEGATIVE mg/dL
Nitrite: NEGATIVE
Protein, ur: 100 mg/dL — AB
Specific Gravity, Urine: 1.023 (ref 1.005–1.030)
pH: 5 (ref 5.0–8.0)

## 2020-02-25 LAB — BASIC METABOLIC PANEL
Anion gap: 8 (ref 5–15)
BUN: 17 mg/dL (ref 8–23)
CO2: 29 mmol/L (ref 22–32)
Calcium: 8 mg/dL — ABNORMAL LOW (ref 8.9–10.3)
Chloride: 101 mmol/L (ref 98–111)
Creatinine, Ser: 1.28 mg/dL — ABNORMAL HIGH (ref 0.61–1.24)
GFR calc Af Amer: >60 mL/min (ref >60)
GFR calc non Af Amer: 53 mL/min — ABNORMAL LOW (ref >60)
Glucose, Bld: 234 mg/dL — ABNORMAL HIGH (ref 70–99)
Potassium: 5.1 mmol/L (ref 3.5–5.1)
Sodium: 138 mmol/L (ref 135–145)

## 2020-02-25 LAB — CBC
HCT: 27.5 % — ABNORMAL LOW (ref 39.0–52.0)
Hemoglobin: 7.6 g/dL — ABNORMAL LOW (ref 13.0–17.0)
MCH: 26.4 pg (ref 26.0–34.0)
MCHC: 27.6 g/dL — ABNORMAL LOW (ref 30.0–36.0)
MCV: 95.5 fL (ref 80.0–100.0)
Platelets: 241 10*3/uL (ref 150–400)
RBC: 2.88 MIL/uL — ABNORMAL LOW (ref 4.22–5.81)
RDW: 16.7 % — ABNORMAL HIGH (ref 11.5–15.5)
WBC: 8.9 10*3/uL (ref 4.0–10.5)
nRBC: 0.3 % — ABNORMAL HIGH (ref 0.0–0.2)

## 2020-02-25 LAB — SARS CORONAVIRUS 2 BY RT PCR (HOSPITAL ORDER, PERFORMED IN ~~LOC~~ HOSPITAL LAB): SARS Coronavirus 2: NEGATIVE

## 2020-02-25 LAB — PREPARE RBC (CROSSMATCH)

## 2020-02-25 LAB — TROPONIN I (HIGH SENSITIVITY)
Troponin I (High Sensitivity): 20 ng/L — ABNORMAL HIGH (ref <18)
Troponin I (High Sensitivity): 16 ng/L (ref <18)

## 2020-02-25 LAB — BRAIN NATRIURETIC PEPTIDE: B Natriuretic Peptide: 158.2 pg/mL — ABNORMAL HIGH (ref 0.0–100.0)

## 2020-02-25 LAB — POC OCCULT BLOOD, ED: Fecal Occult Bld: POSITIVE — AB

## 2020-02-25 MED ORDER — INSULIN ASPART 100 UNIT/ML ~~LOC~~ SOLN: 0.0000 [IU] | Freq: Three times a day (TID) | SUBCUTANEOUS | Status: DC

## 2020-02-25 MED ORDER — ATORVASTATIN CALCIUM 40 MG PO TABS: 40.0000 mg | ORAL_TABLET | Freq: Every day | ORAL | Status: DC

## 2020-02-25 MED ORDER — SODIUM CHLORIDE 0.9% FLUSH
3.0000 mL | Freq: Two times a day (BID) | INTRAVENOUS | Status: DC
  Administered 2020-02-25: 3 mL via INTRAVENOUS

## 2020-02-25 MED ORDER — ONDANSETRON HCL 4 MG PO TABS: 4.0000 mg | ORAL_TABLET | Freq: Four times a day (QID) | ORAL | Status: DC | PRN

## 2020-02-25 MED ORDER — TIOTROPIUM BROMIDE MONOHYDRATE 18 MCG IN CAPS
18.0000 ug | ORAL_CAPSULE | Freq: Every day | RESPIRATORY_TRACT | Status: DC
  Filled 2020-02-25: qty 5

## 2020-02-25 MED ORDER — SODIUM CHLORIDE 0.9% IV SOLUTION: Freq: Once | INTRAVENOUS | Status: AC

## 2020-02-25 MED ORDER — SODIUM CHLORIDE 0.9% FLUSH
3.0000 mL | Freq: Once | INTRAVENOUS | Status: AC
  Administered 2020-02-25: 10:00:00 3 mL via INTRAVENOUS

## 2020-02-25 MED ORDER — PROPRANOLOL HCL 10 MG PO TABS: 10.0000 mg | ORAL_TABLET | Freq: Every day | ORAL | Status: DC

## 2020-02-25 MED ORDER — MELATONIN 5 MG PO TABS: 5.0000 mg | ORAL_TABLET | Freq: Every evening | ORAL | Status: DC | PRN

## 2020-02-25 MED ORDER — UMECLIDINIUM BROMIDE 62.5 MCG/INH IN AEPB
1.0000 | INHALATION_SPRAY | Freq: Every day | RESPIRATORY_TRACT | Status: DC
  Filled 2020-02-25: qty 7

## 2020-02-25 MED ORDER — ACETAMINOPHEN 650 MG RE SUPP: 650.0000 mg | Freq: Four times a day (QID) | RECTAL | Status: DC | PRN

## 2020-02-25 MED ORDER — PANTOPRAZOLE SODIUM 40 MG PO TBEC: 40.0000 mg | DELAYED_RELEASE_TABLET | Freq: Two times a day (BID) | ORAL | Status: DC

## 2020-02-25 MED ORDER — FUROSEMIDE 20 MG PO TABS: 40.0000 mg | ORAL_TABLET | Freq: Two times a day (BID) | ORAL | Status: DC

## 2020-02-25 MED ORDER — ACETAMINOPHEN 325 MG PO TABS: 650.0000 mg | ORAL_TABLET | Freq: Four times a day (QID) | ORAL | Status: DC | PRN

## 2020-02-25 MED ORDER — GABAPENTIN 800 MG PO TABS
400.0000 mg | ORAL_TABLET | Freq: Four times a day (QID) | ORAL | Status: DC
  Filled 2020-02-25: qty 0.5

## 2020-02-25 MED ORDER — ALBUTEROL SULFATE (2.5 MG/3ML) 0.083% IN NEBU: 2.5000 mg | INHALATION_SOLUTION | Freq: Four times a day (QID) | RESPIRATORY_TRACT | Status: DC | PRN

## 2020-02-25 MED ORDER — ONDANSETRON HCL 4 MG/2ML IJ SOLN: 4.0000 mg | Freq: Four times a day (QID) | INTRAMUSCULAR | Status: DC | PRN

## 2020-02-25 MED ORDER — GABAPENTIN 400 MG PO CAPS: 400.0000 mg | ORAL_CAPSULE | Freq: Four times a day (QID) | ORAL | Status: DC

## 2020-02-25 MED ORDER — DICLOFENAC SODIUM 1 % EX GEL
4.0000 g | Freq: Two times a day (BID) | CUTANEOUS | Status: DC | PRN
  Filled 2020-02-25: qty 100

## 2020-02-25 MED ORDER — FLUTICASONE PROPIONATE 50 MCG/ACT NA SUSP
2.0000 | Freq: Every day | NASAL | Status: DC | PRN
  Filled 2020-02-25: qty 16

## 2020-02-25 MED ORDER — INSULIN GLARGINE 100 UNIT/ML ~~LOC~~ SOLN: 35.0000 [IU] | Freq: Two times a day (BID) | SUBCUTANEOUS | Status: DC

## 2020-02-25 MED ORDER — BENZONATATE 100 MG PO CAPS: 100.0000 mg | ORAL_CAPSULE | Freq: Three times a day (TID) | ORAL | Status: DC | PRN

## 2020-02-25 MED ORDER — CITALOPRAM HYDROBROMIDE 10 MG PO TABS: 20.0000 mg | ORAL_TABLET | Freq: Every day | ORAL | Status: DC

## 2020-02-25 MED ORDER — SODIUM CHLORIDE 0.9 % IV SOLN: 510.0000 mg | Freq: Once | INTRAVENOUS | Status: DC

## 2020-02-25 NOTE — ED Notes (Signed)
Pt requesting to see the CN.

## 2020-02-25 NOTE — ED Notes (Signed)
Emailed [facility] to get a carb mod tray for this pt.

## 2020-02-25 NOTE — ED Notes (Signed)
Notified admitting provider that pt is unhappy with the diet ordered.

## 2020-02-25 NOTE — ED Notes (Signed)
Pt refused to vitals after blood was completed.

## 2020-02-25 NOTE — ED Notes (Signed)
Tele Dinner ordered 

## 2020-02-25 NOTE — ED Notes (Signed)
Pt increasingly hostile with staff. Advises he wants to leave against medical advice. Pt is alert and oriented (X4), able to make safe choices for self-care. Attending MD notified, aware pt is leaving AMA. Pt left facility.
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
## 2020-02-25 NOTE — ED Notes (Signed)
Pt refused all further treatment and assessment, pt escorted out of treatment room by security. Pt providing own transportation home via [facility]. Pt verbalized understanding that we could not supply him with an oxygen cylinder to take home for transport. He verbalized understanding of risks and disposition against medical advice. Pt left facility against medical advice.

## 2020-02-25 NOTE — ED Notes (Signed)
Pt has cussed and belittled me each time I have entered the room. He has made disparaging and inappropriate remarks about other staff members, including the admitting provider.

## 2020-02-25 NOTE — ED Notes (Signed)
Pt is requesting discharge papers, notified admitting provider.

## 2020-02-25 NOTE — ED Triage Notes (Signed)
Pt arrives to ED via [facility] from home due to increase swelling in abdomen and lower extremities. Per ems pt has not taken his lasix for the past 2 days but did take a dose prior to calling ems.

## 2020-02-25 NOTE — ED Notes (Signed)
[place] delivered to pt and explained that the admitting provider had put in the diet order after assessing the pt.

## 2020-02-25 NOTE — ED Notes (Signed)
Lunch Tray Ordered 

## 2020-02-25 NOTE — Discharge Planning (Signed)
RNCM consulted [facility] for referral.  Will update team.
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
## 2020-02-26 LAB — BPAM RBC
Blood Product Expiration Date: 202105252359
ISSUE DATE / TIME: 202105111507
Unit Type and Rh: 1700

## 2020-02-26 LAB — TYPE AND SCREEN
ABO/RH(D): B NEG
Antibody Screen: NEGATIVE
Unit division: 0

## 2020-03-02 IMAGING — DX DG HIP (WITH OR WITHOUT PELVIS) 3-4V BILAT
5 series · 5 of 5 positions shown · non-contrast
Comparison: None.

CLINICAL DATA: Bilateral hip pain, knee pain

EXAM:
DG HIP (WITH OR WITHOUT PELVIS) 3-4V BILAT

[pelvis ap]
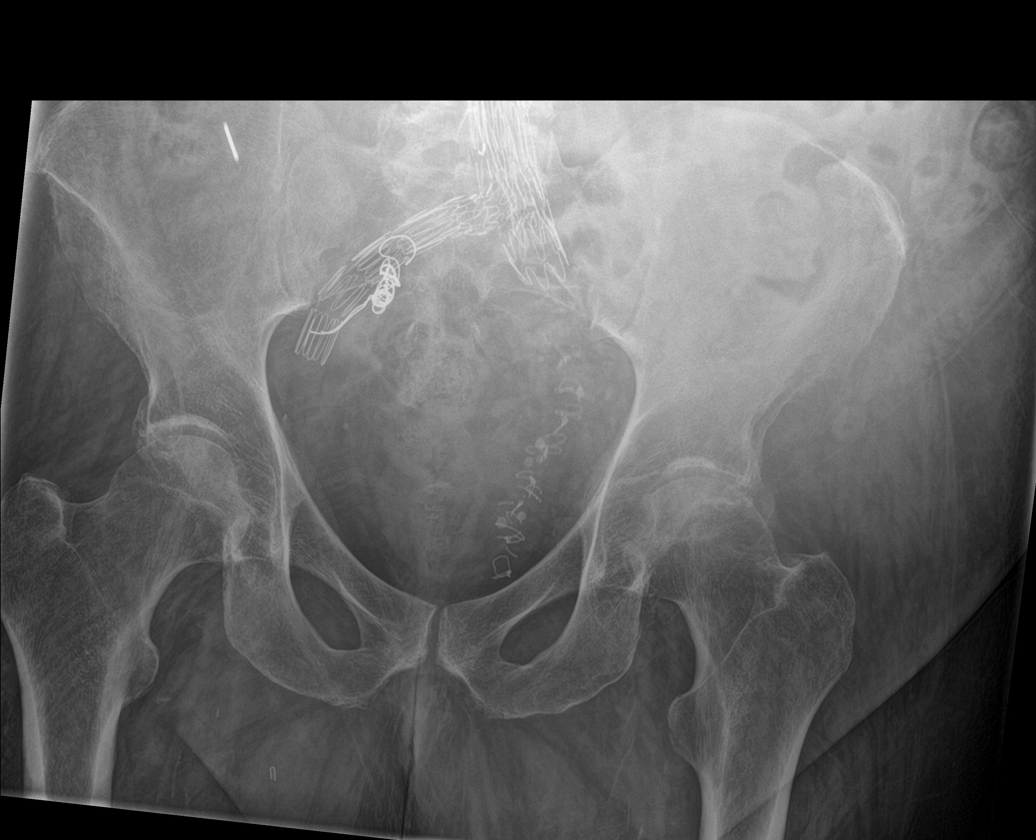

[hip ap (1 of 2)]
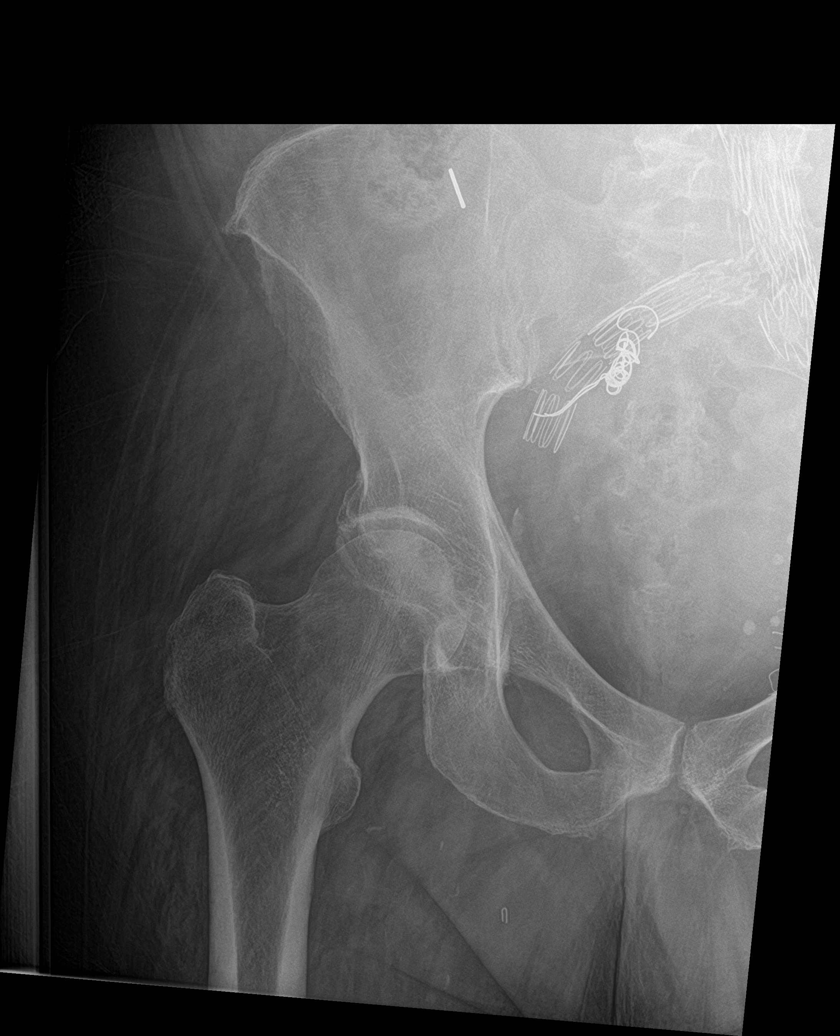

[hip lat (1 of 2)]
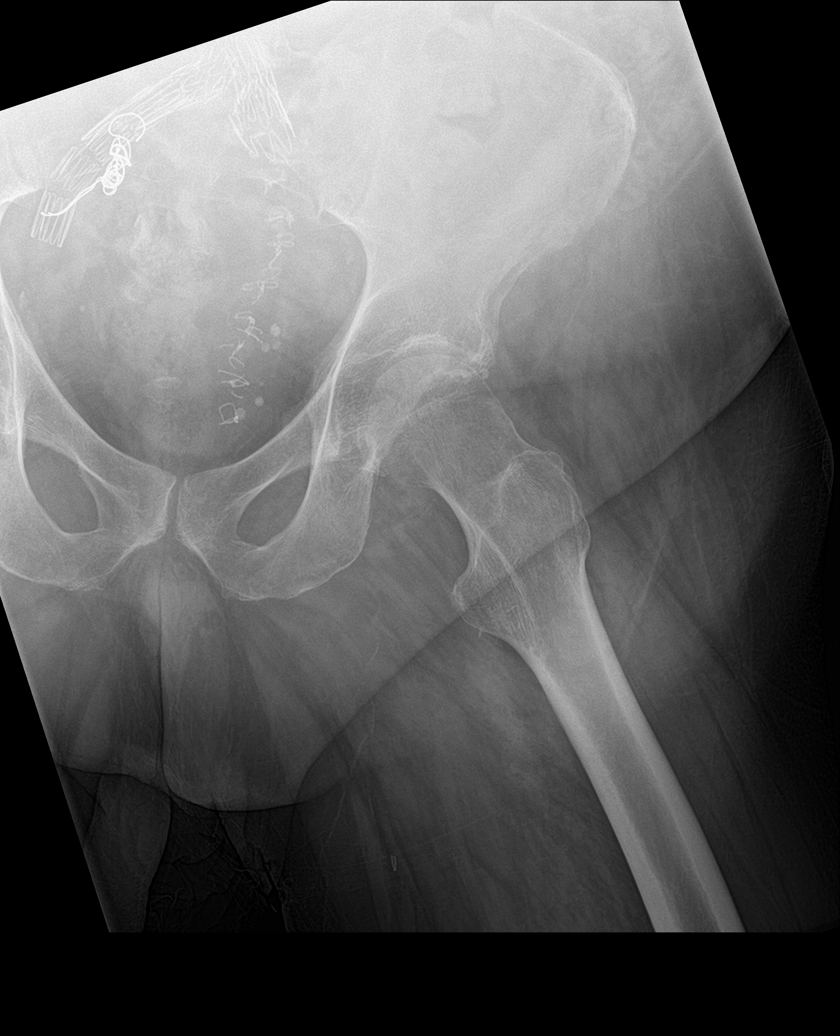

[hip ap (2 of 2)]
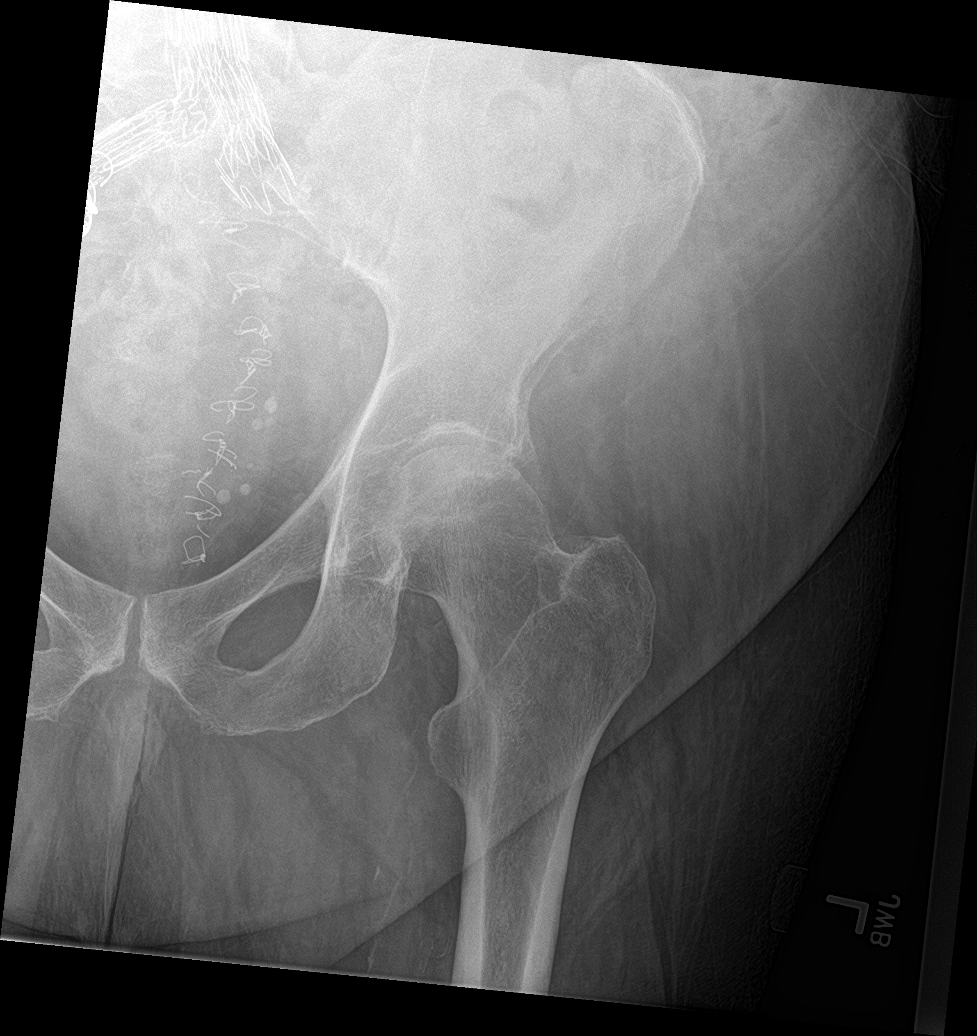

[hip lat (2 of 2)]
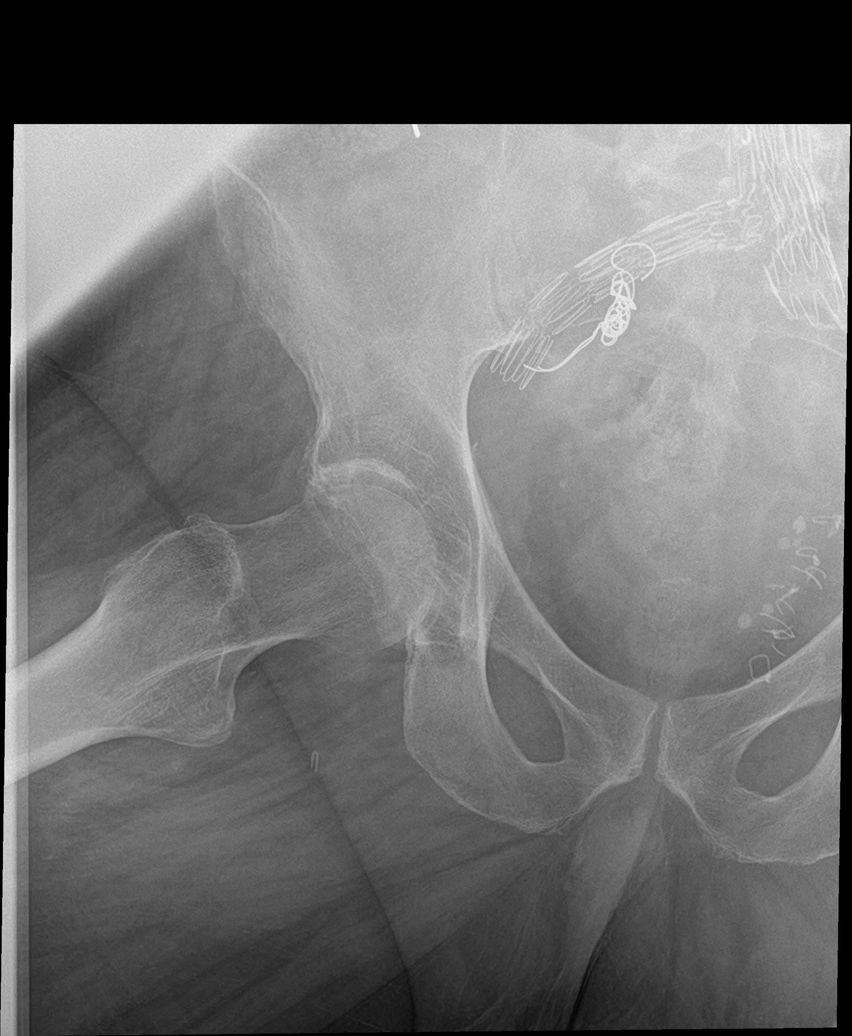

[5 of 5 positions shown; findings below may reference images not displayed]

FINDINGS: Status post stent graft and vascular coiling within the pelvis. Mild
degenerative changes in the hips bilaterally, left greater than
right with joint space narrowing and early spurring. No acute bony
abnormality. Specifically, no fracture, subluxation, or dislocation.
IMPRESSION: No acute bony abnormality.

## 2020-03-02 IMAGING — CR DG THORACIC SPINE 2V
3 series · 3 of 3 positions shown · non-contrast
Comparison: Thoracic spine radiographs 04/27/2019

CLINICAL DATA: Pt c/o generalized upper and lower back pain after
falling today

EXAM:
THORACIC SPINE 2 VIEWS

[t-spine ap]
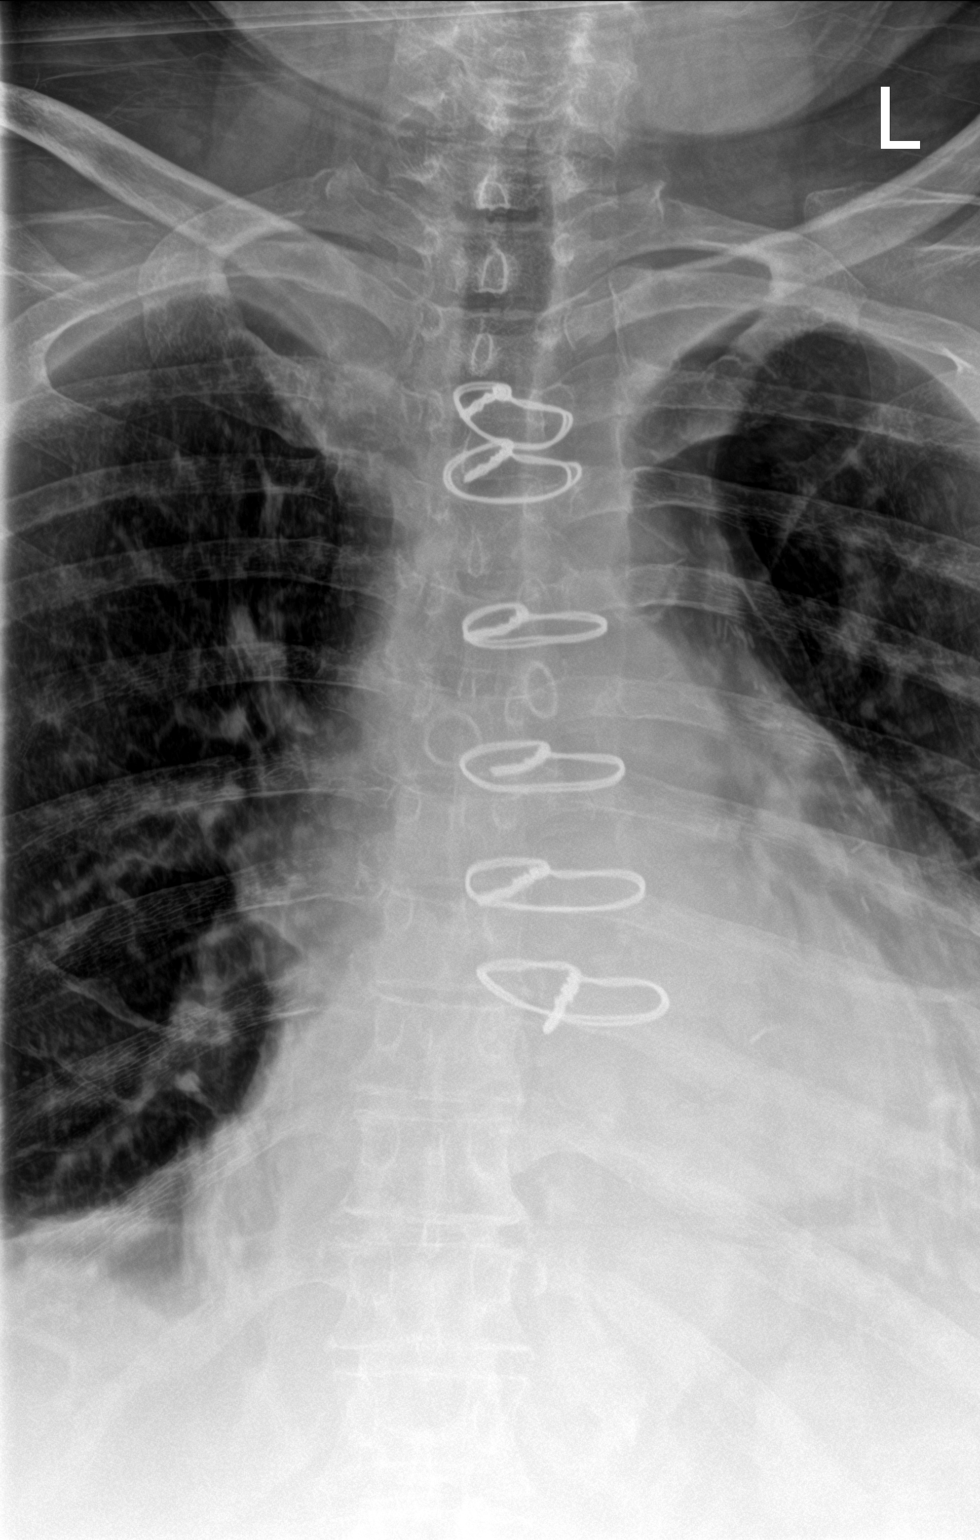

[t-spine lat]
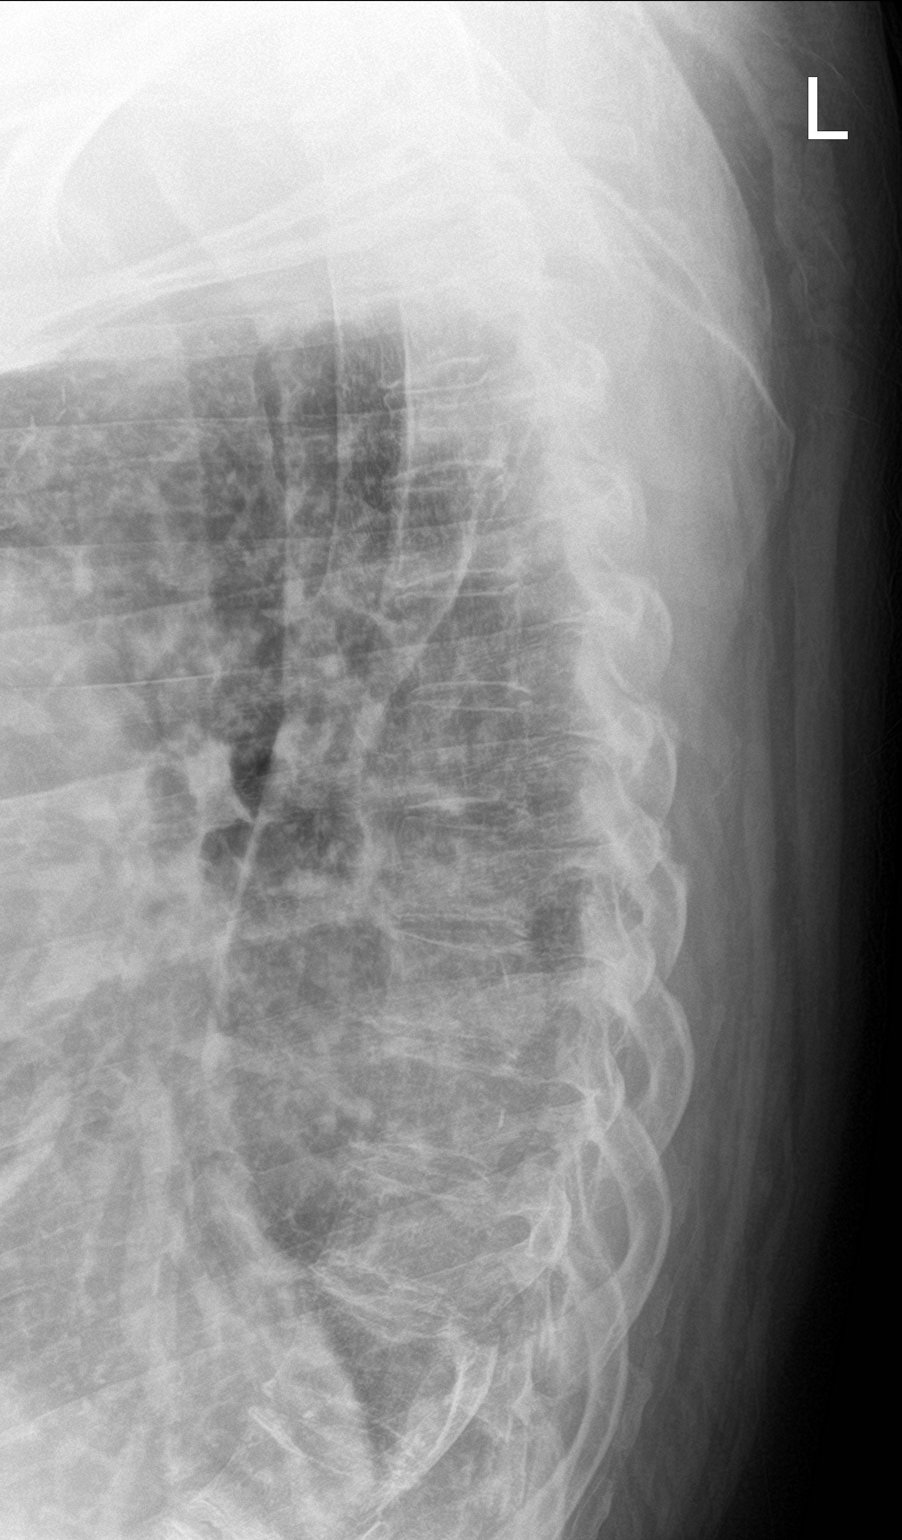

[t-spine swimmers]
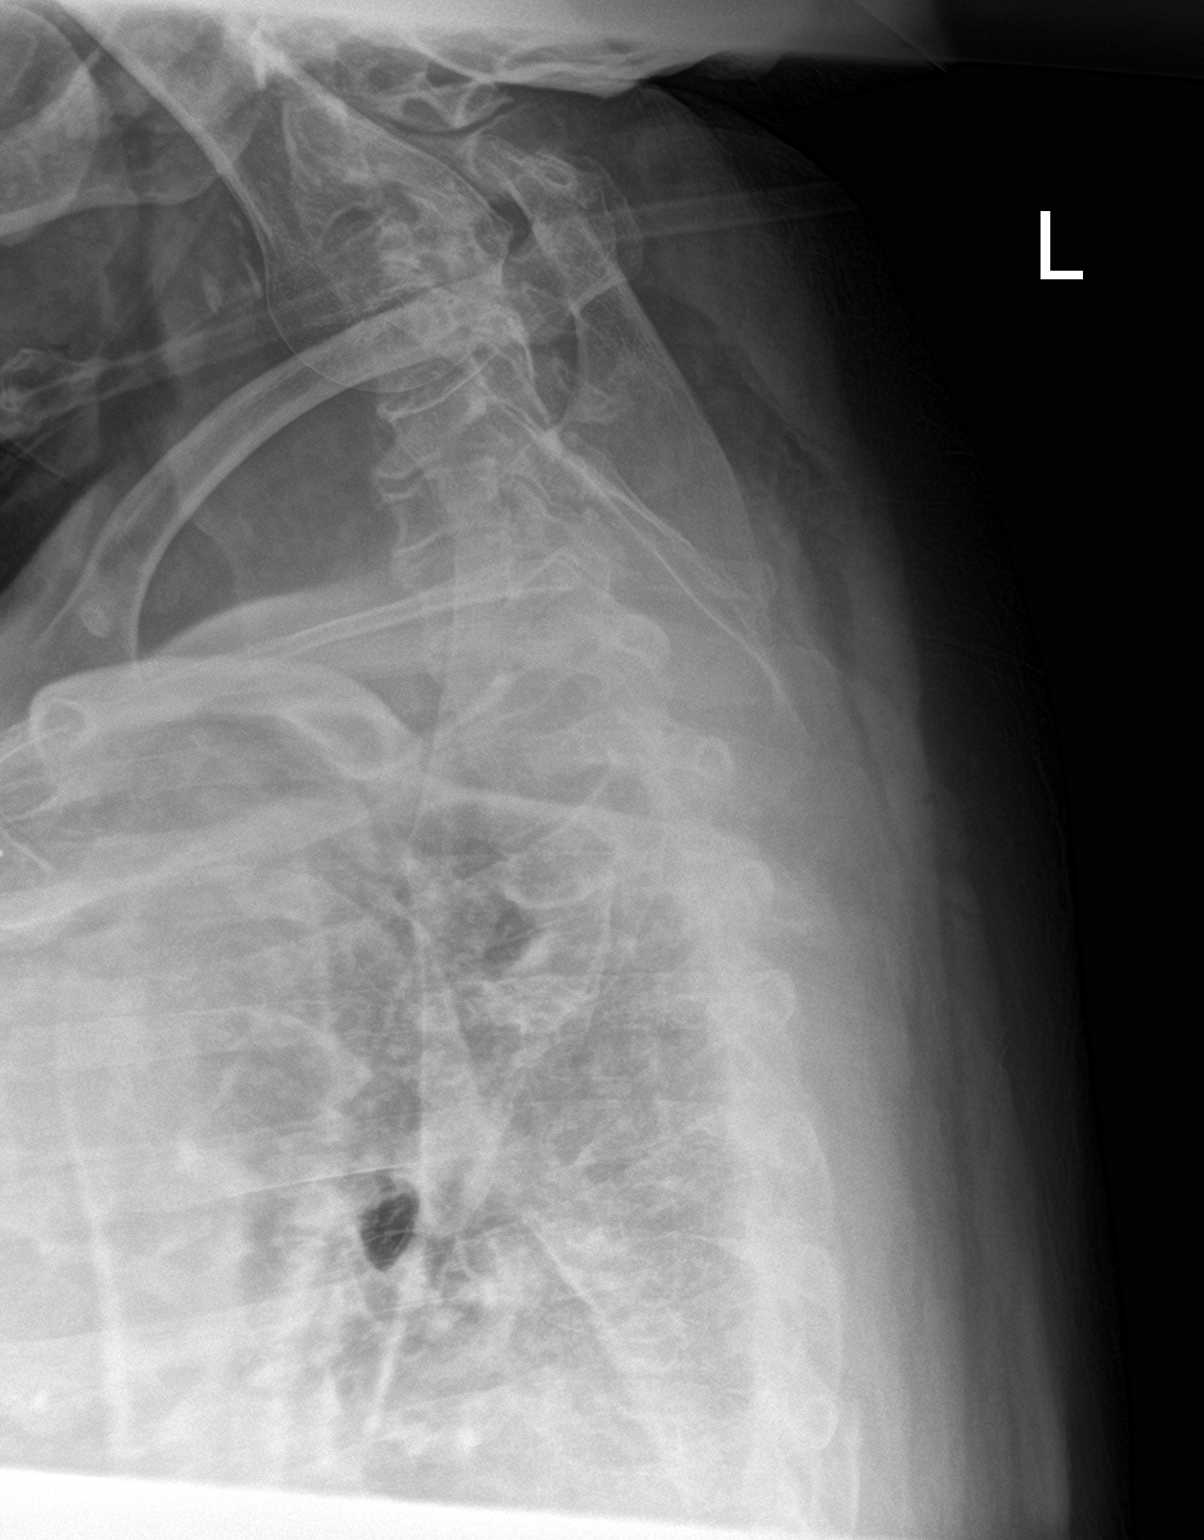

[3 of 3 positions shown; findings below may reference images not displayed]

FINDINGS: There is no evidence of thoracic spine fracture. Alignment is
normal. No other significant bone abnormalities are identified.
IMPRESSION: Negative thoracic spine radiographs.

## 2020-03-02 IMAGING — CR DG ANKLE COMPLETE 3+V*L*
3 series · 3 of 3 positions shown · non-contrast
Comparison: None.

CLINICAL DATA: Pt c/o generalized upper and lower back pain and
generalized left ankle pain after falling in his bathroom today. No
hx of prior injuries or surgeries to any of the affected areas.

EXAM:
LEFT ANKLE COMPLETE - 3+ VIEW

[ankle ap]
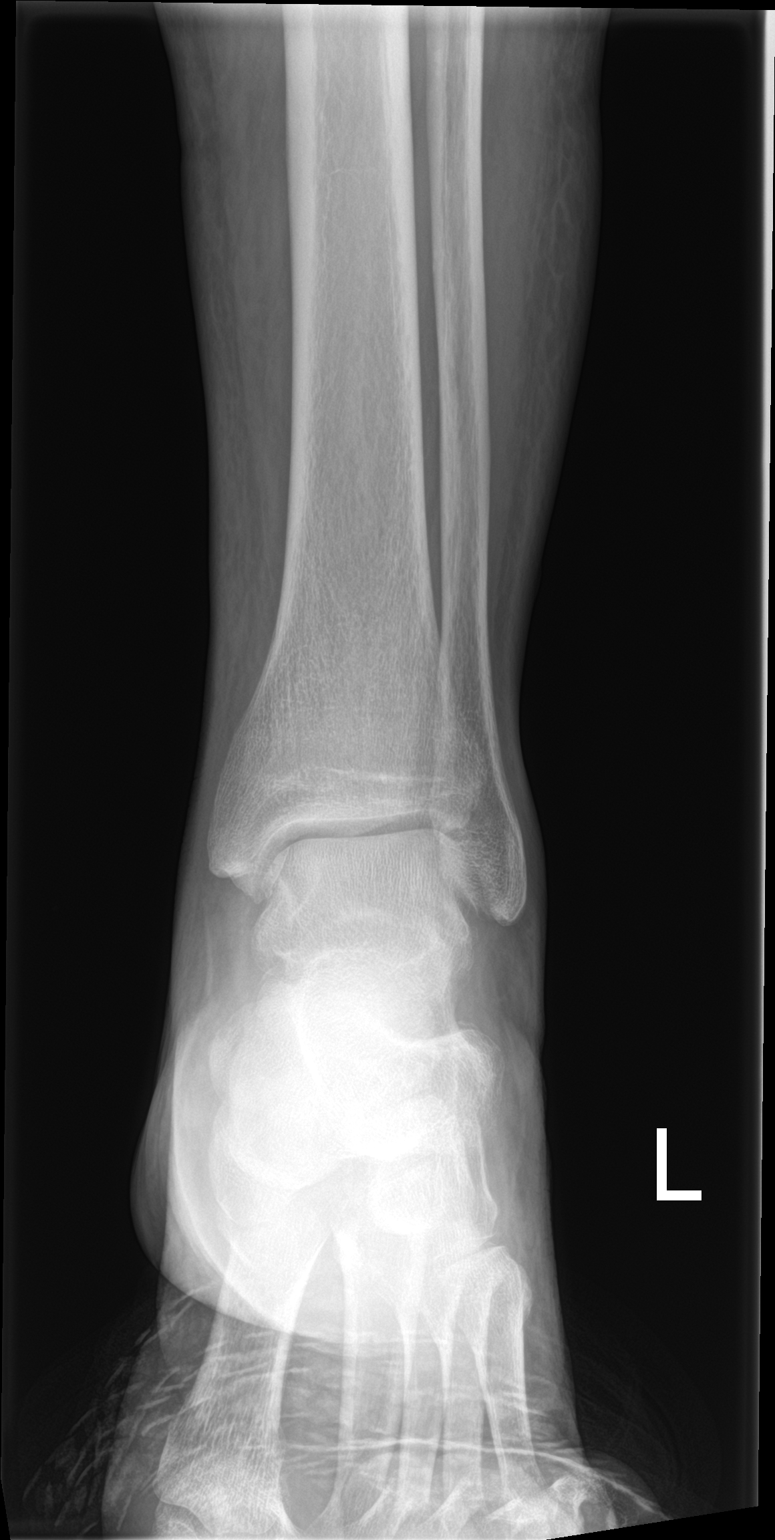

[ankle obl]
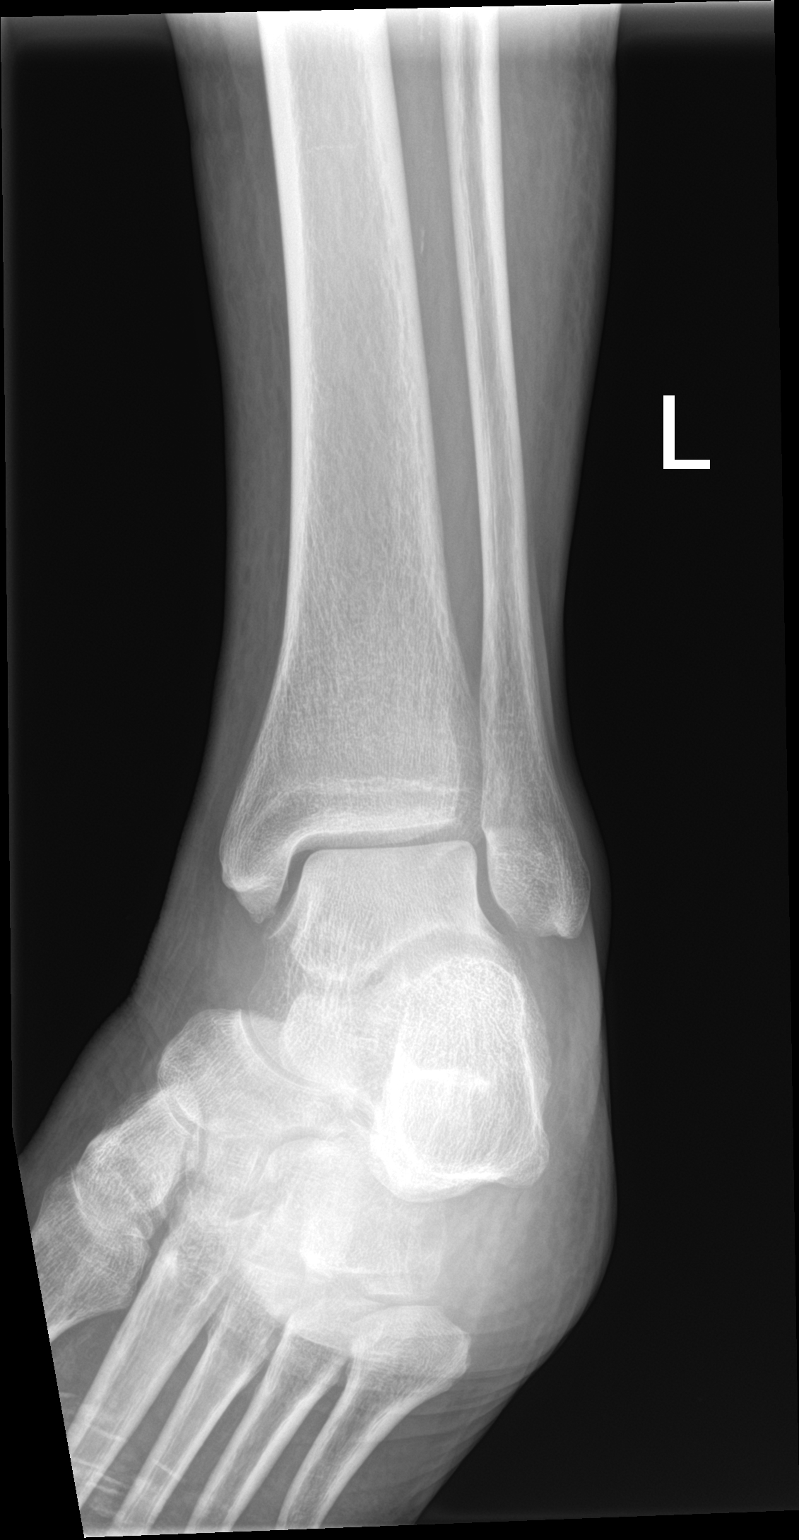

[ankle lat]
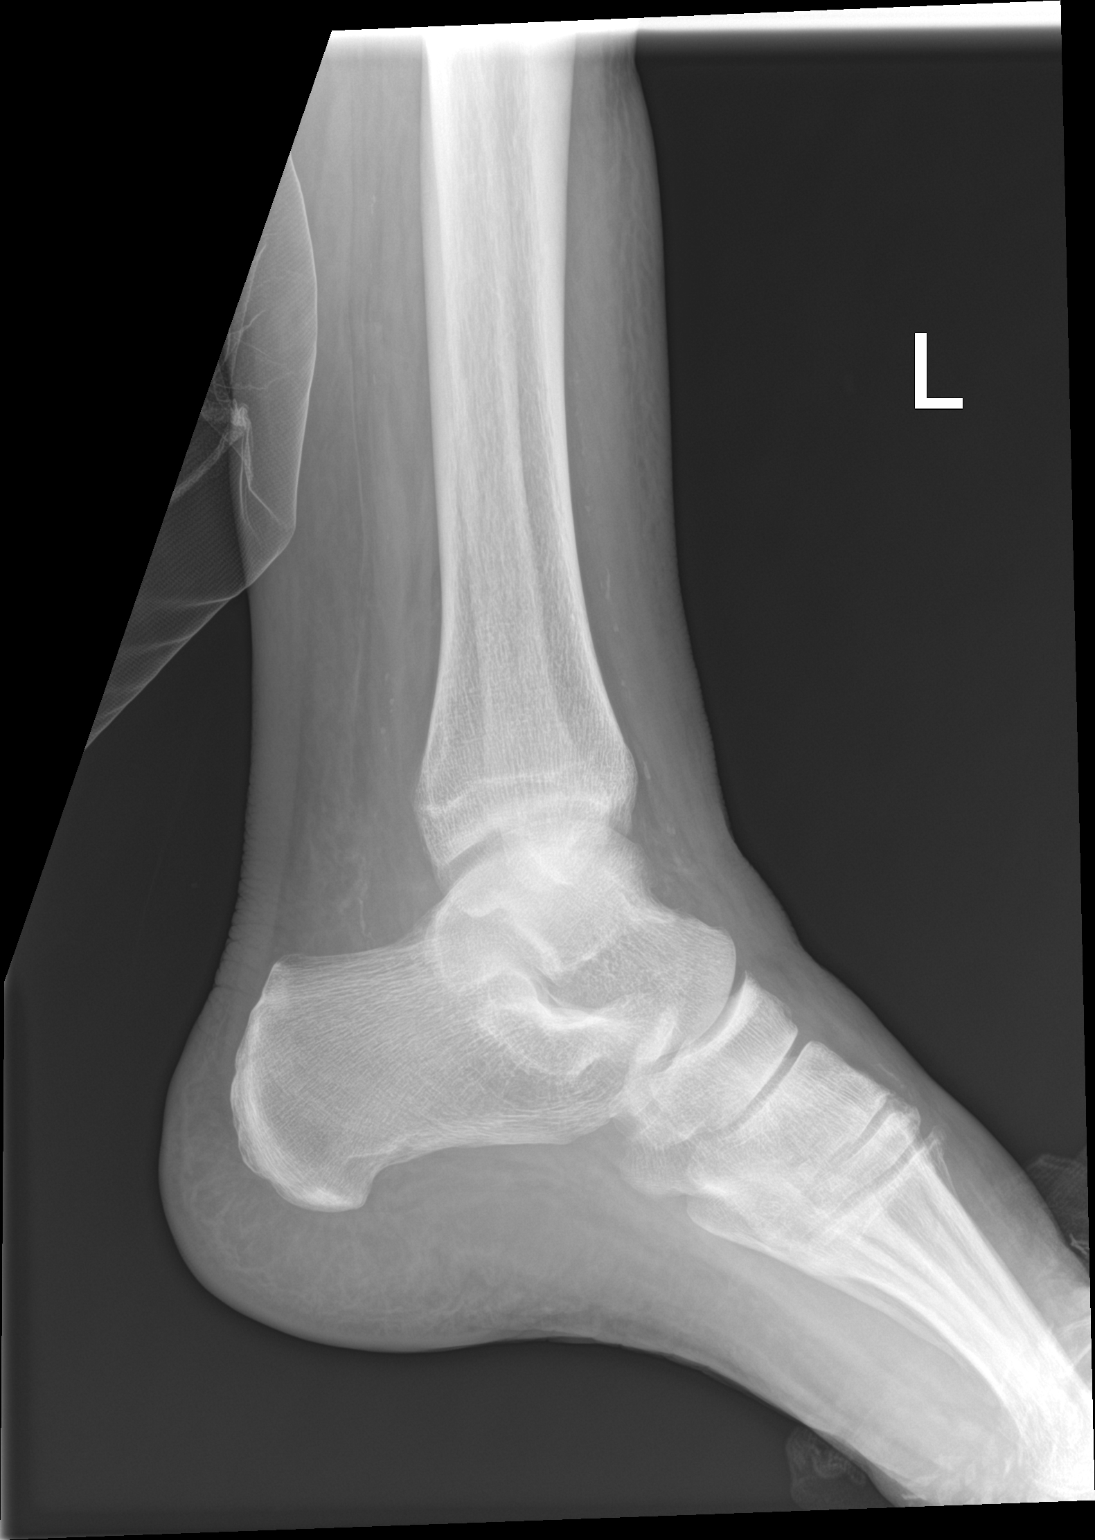

[3 of 3 positions shown; findings below may reference images not displayed]

FINDINGS: There is no evidence of fracture, dislocation, or joint effusion.
Talar dome intact. Tibiotalar joint space maintained. No focal
osseous lesion. Regional soft tissues are unremarkable.
IMPRESSION: No acute osseous abnormality in the left ankle.

## 2020-03-02 IMAGING — CR DG LUMBAR SPINE 2-3V
3 series · 3 of 3 positions shown · non-contrast
Comparison: Lumbar spine radiographs 04/27/2019

CLINICAL DATA: Back pain after a fall today

EXAM:
LUMBAR SPINE - 2-3 VIEW

[l-spine ap]
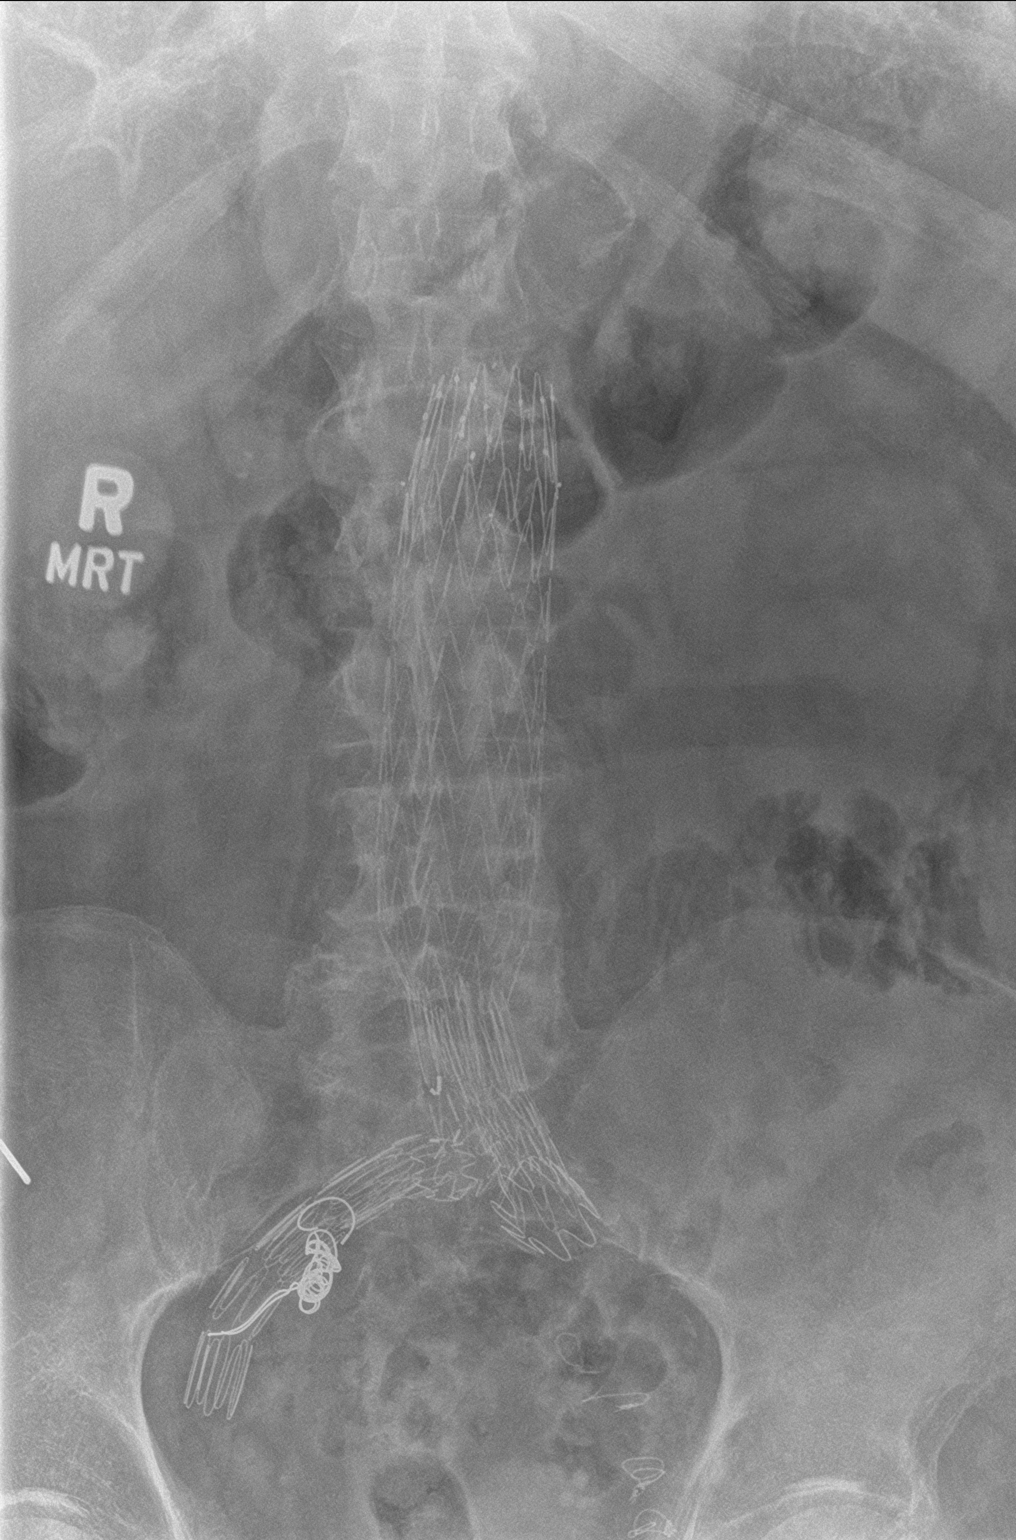

[l-spine lat]
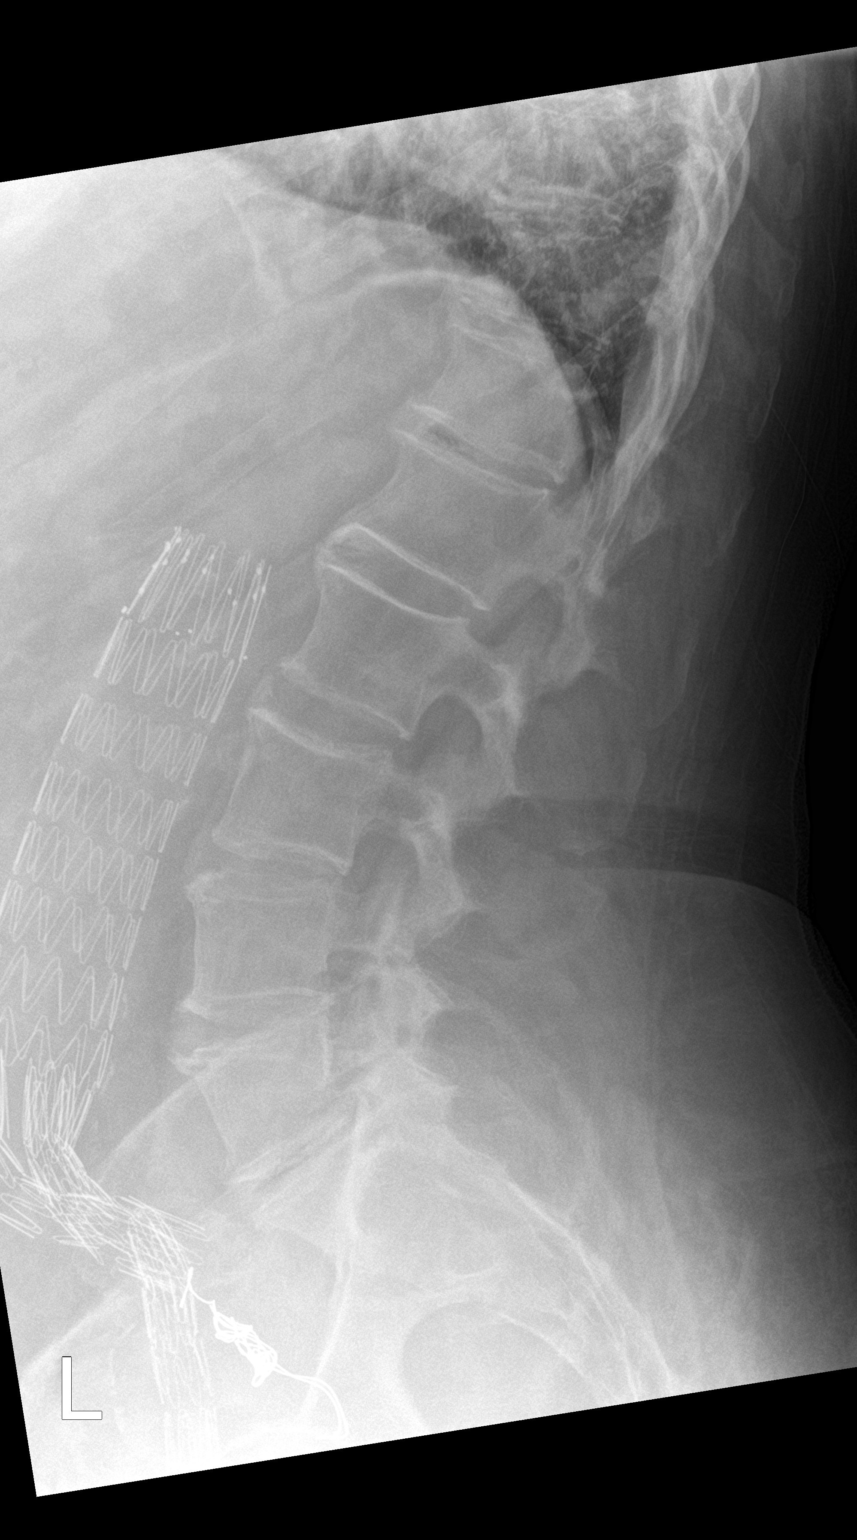

[l-spine spot]
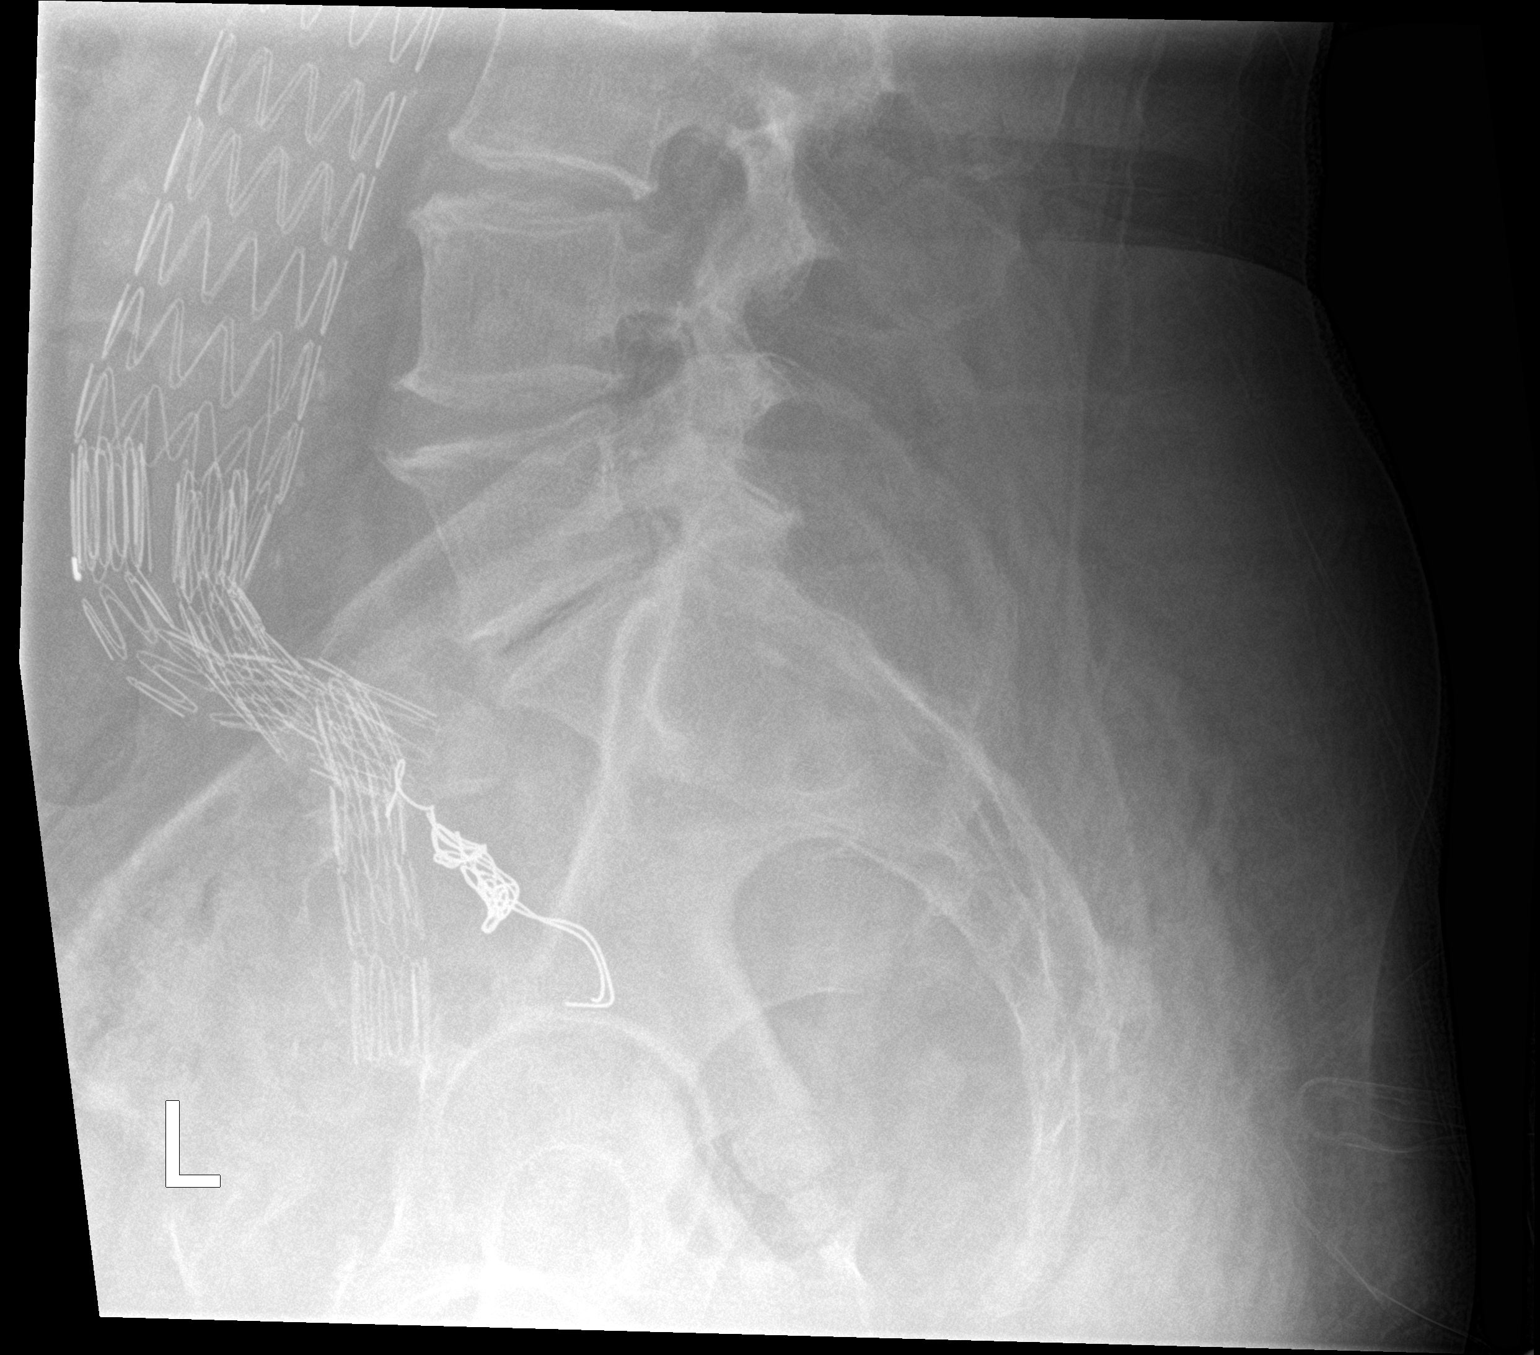

[3 of 3 positions shown; findings below may reference images not displayed]

FINDINGS: Alignment intact. Vertebral body heights are maintained without
evidence of acute fracture. Stable mild anterior wedging of T12.
Multilevel degenerative changes and lower facet arthropathy. Aortic
stent graft in place. Nonobstructive bowel gas pattern.
IMPRESSION: No evidence of acute fracture or static listhesis in the lumbar
spine.

## 2020-03-02 IMAGING — DX DG KNEE COMPLETE 4+V*L*
4 series · 4 of 4 positions shown · non-contrast
Comparison: None.

CLINICAL DATA: Fall, left knee pain.

EXAM:
LEFT KNEE - COMPLETE 4+ VIEW

[knee ap]
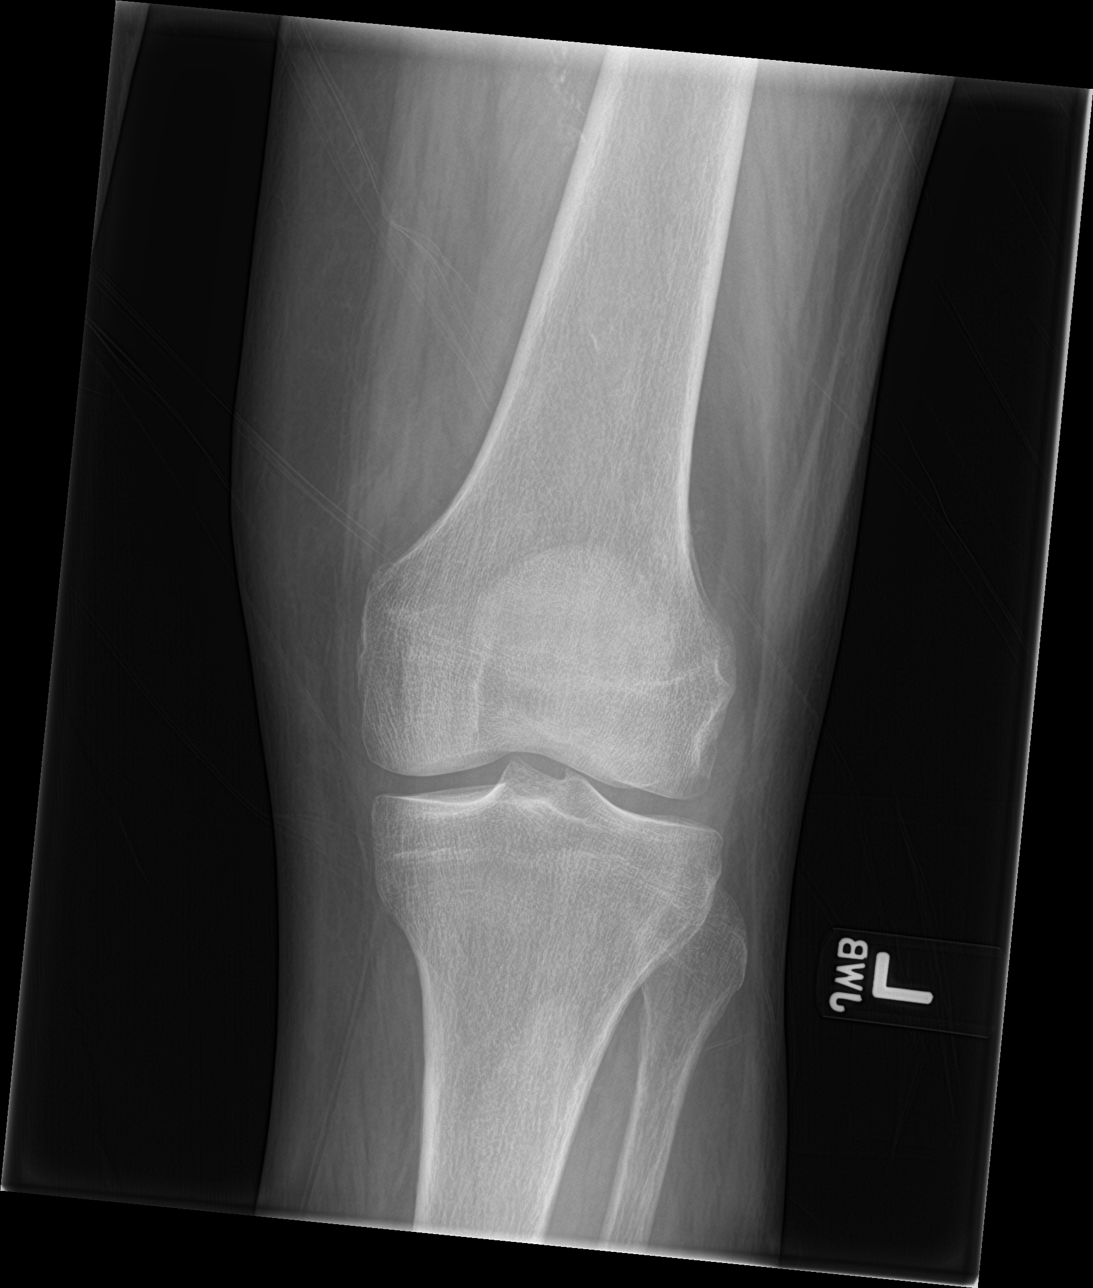

[knee lat]
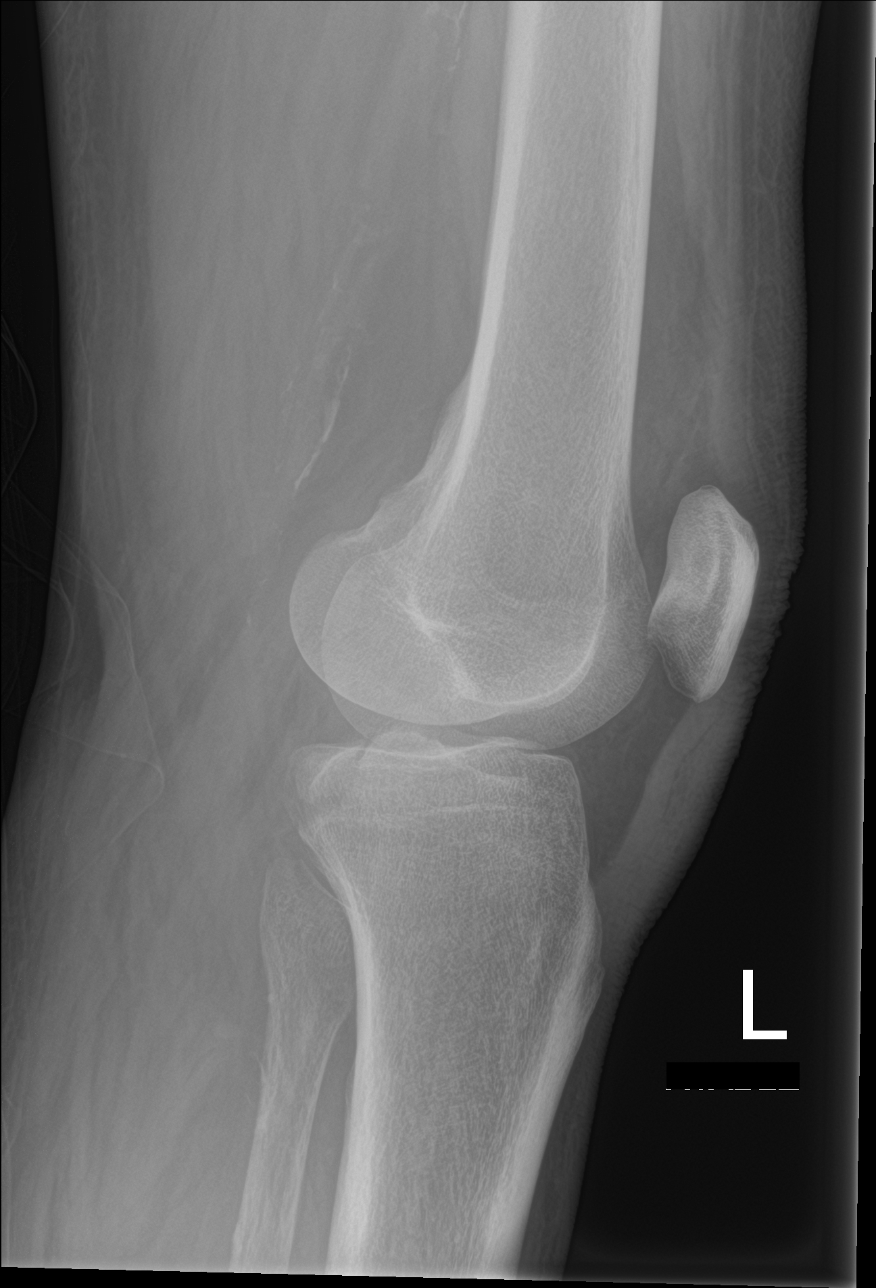

[knee obl (1 of 2)]
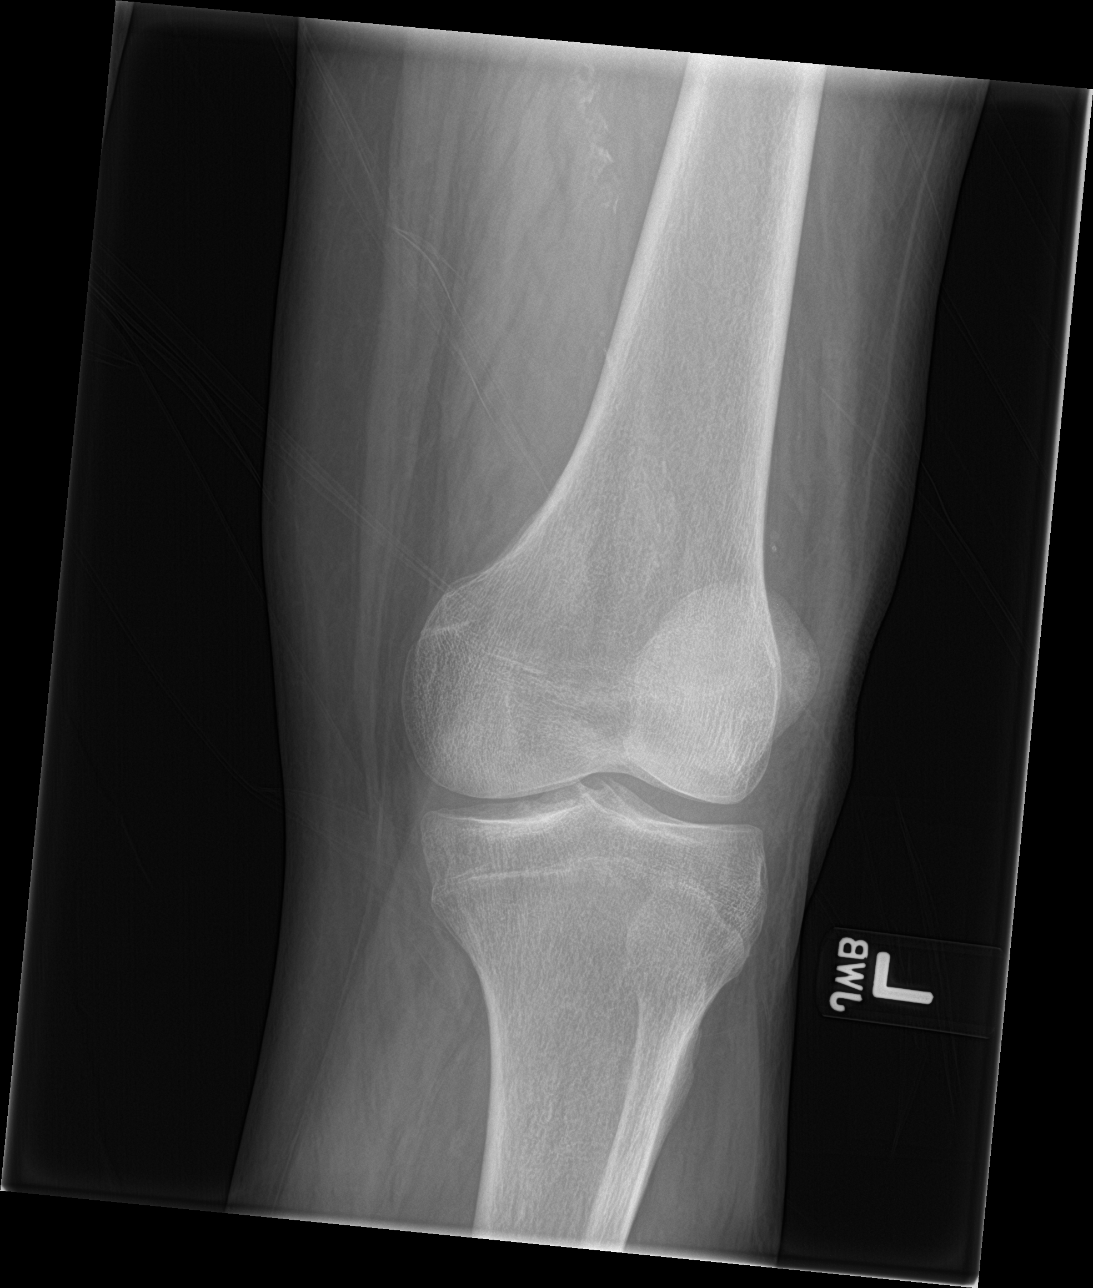

[knee obl (2 of 2)]
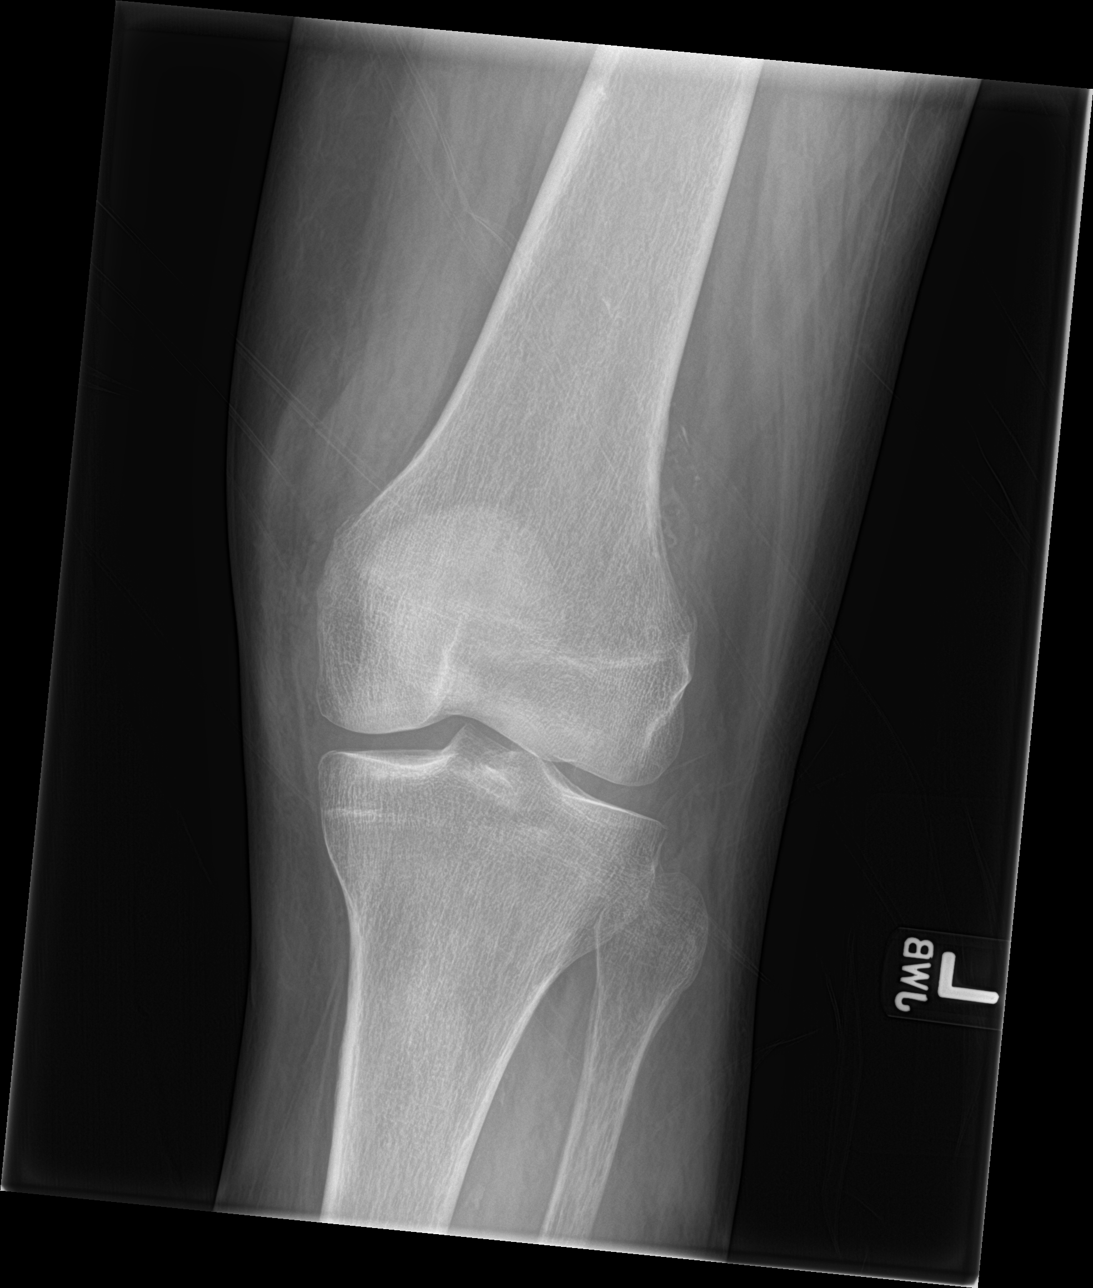

[4 of 4 positions shown; findings below may reference images not displayed]

FINDINGS: No acute bony abnormality. Specifically, no fracture, subluxation,
or dislocation. Joint spaces maintained. No joint effusion. Vascular
calcifications noted.
IMPRESSION: No acute bony abnormality.

## 2020-03-07 IMAGING — DX DG CHEST 1V PORT
1 series · 1 of 1 positions shown · non-contrast
Comparison: 07/05/2019; 06/24/2019

CLINICAL DATA: Left-sided chest pain.

EXAM:
PORTABLE CHEST 1 VIEW

[chest ap]
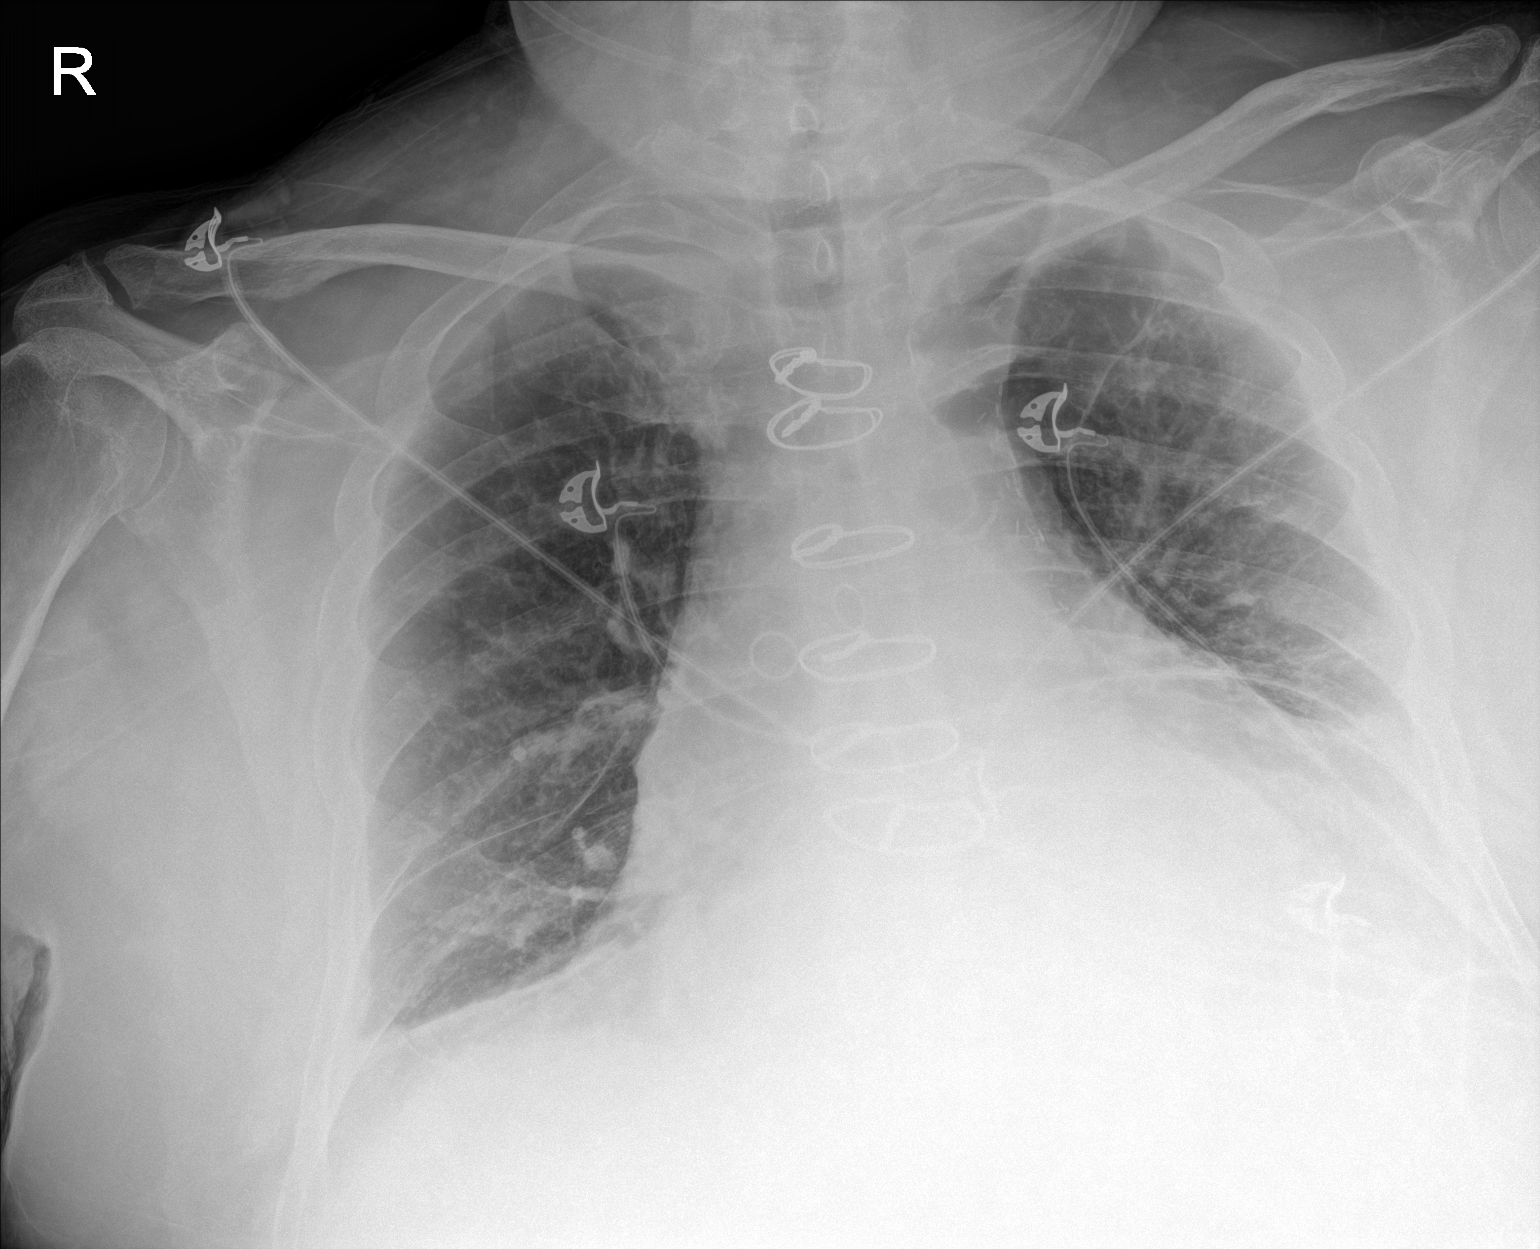

[1 of 1 positions shown; findings below may reference images not displayed]

FINDINGS: Examination is degraded due to patient body habitus and portable
technique.

Grossly unchanged enlarged cardiac silhouette and mediastinal
contours with atherosclerotic plaque within the aortic arch. Post
median sternotomy and CABG. Unchanged left basilar
heterogeneous/consolidative opacities. The lungs remain
hyperexpanded with flattening the bilaterally diaphragms mild
diffuse slightly nodular thickening the pulmonary interstitium. Mild
pulmonary venous congestion without frank evidence of edema. No new
focal airspace opacities. No acute osseous abnormalities.
IMPRESSION: Similar findings of cardiomegaly, pulmonary venous congestion and
bibasilar atelectasis/scar, left greater than right, without
definitive superimposed acute cardiopulmonary disease on this AP
portable examination.

## 2020-03-09 IMAGING — MR MR KNEE*L* W/O CM
6 series · 40 of 40 positions shown · non-contrast
Comparison: Radiographs 07/30/2019

CLINICAL DATA: Chronic knee pain.

EXAM:
MRI OF THE LEFT KNEE WITHOUT CONTRAST
TECHNIQUE: Multiplanar, multisequence MR imaging of the knee was performed. No
intravenous contrast was administered.

[Series 8: T2 fat-sat · axial · left · 4.0mm · 0.50mm/px · z∈[-48,+77]mm · 5 of 26 slices shown (1 of 3)]
[im 1/26]
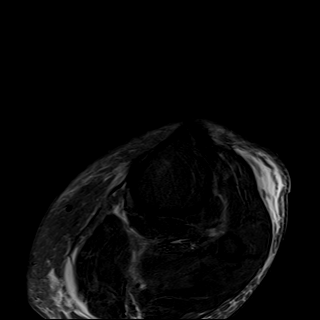
[im 7/26]
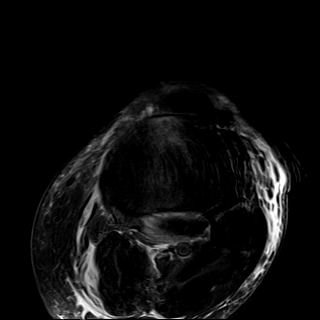
[im 13/26]
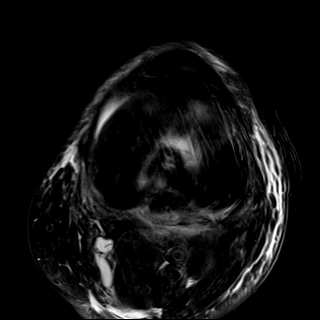
[im 19/26]
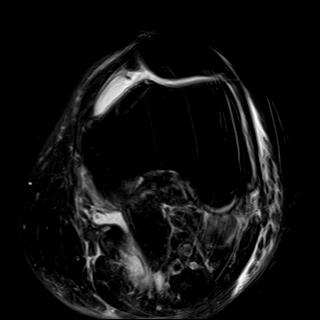
[im 26/26]
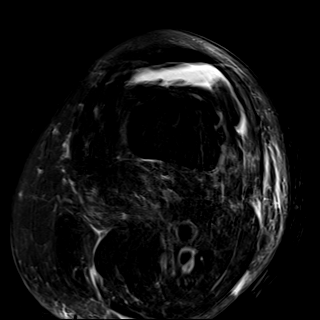

[Series 9: T1 · coronal · left · 4.0mm · 0.42mm/px · 7 of 32 slices shown]
[im 1/32]
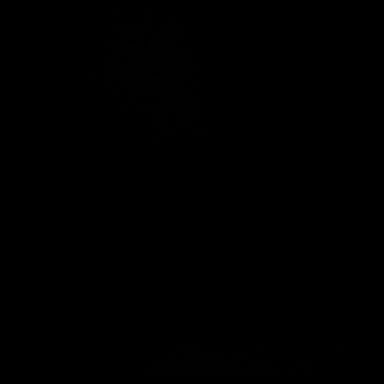
[im 6/32]
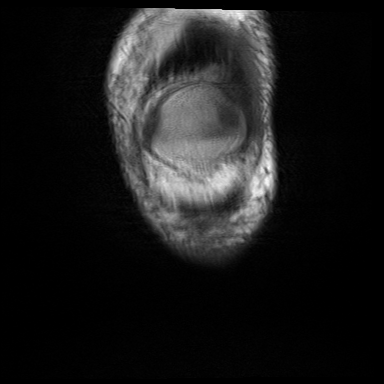
[im 11/32]
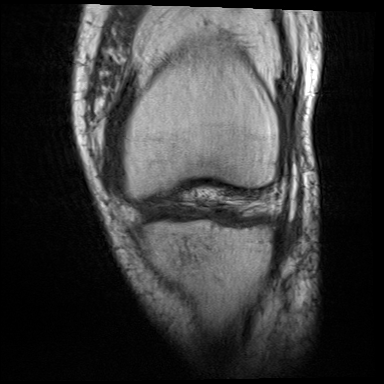
[im 16/32]
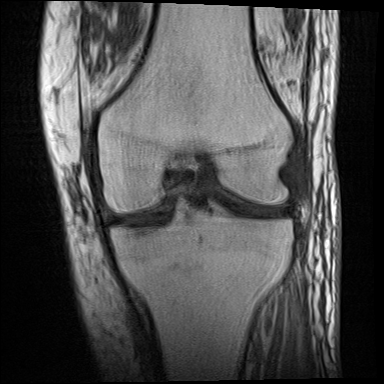
[im 21/32]
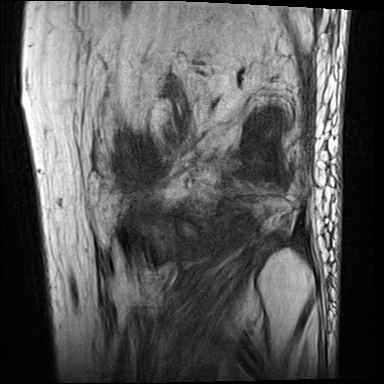
[im 26/32]
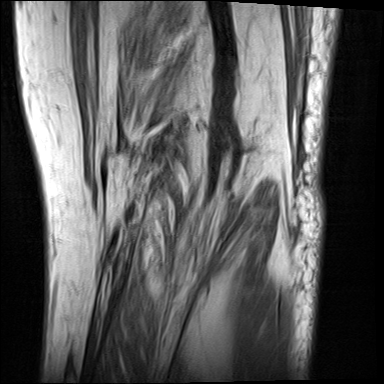
[im 32/32]
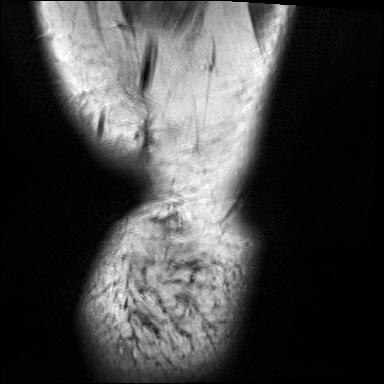

[Series 10: T2 fat-sat · coronal · left · 4.0mm · 0.59mm/px · 6 of 30 slices shown (2 of 3)]
[im 1/30]
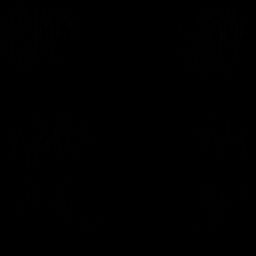
[im 6/30]
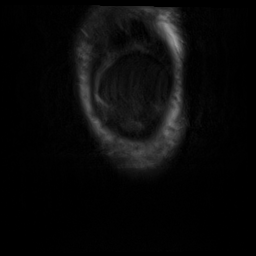
[im 12/30]
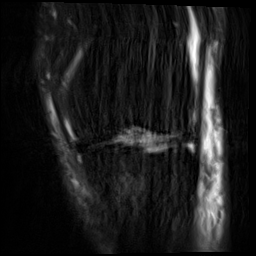
[im 18/30]
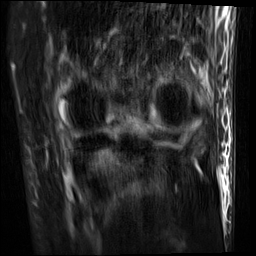
[im 24/30]
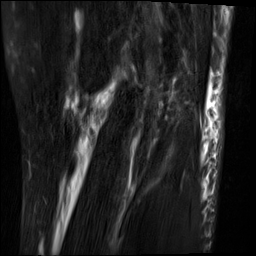
[im 30/30]
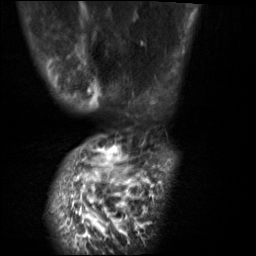

[Series 11: PD fat-sat · coronal · left · 4.0mm · 0.59mm/px · 6 of 27 slices shown (1 of 2)]
[im 1/27]
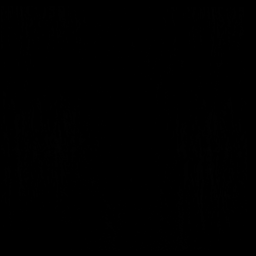
[im 6/27]
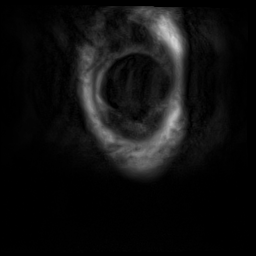
[im 11/27]
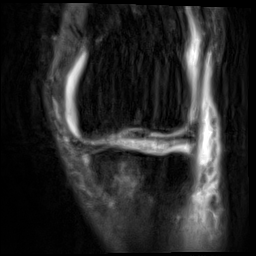
[im 16/27]
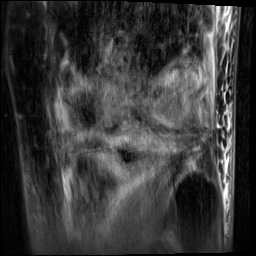
[im 21/27]
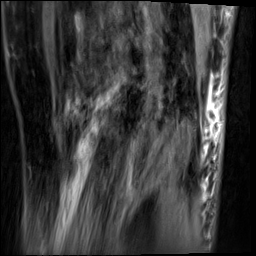
[im 27/27]
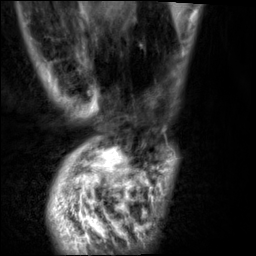

[Series 13: PD fat-sat · sagittal · left · 3.0mm · 0.59mm/px · 8 of 35 slices shown (2 of 2)]
[im 1/35]
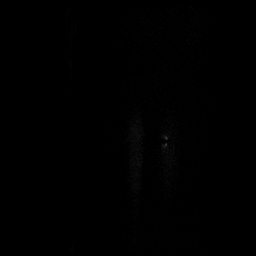
[im 5/35]
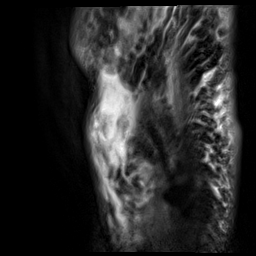
[im 10/35]
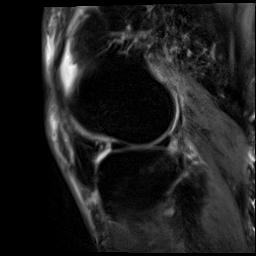
[im 15/35]
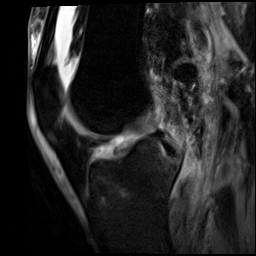
[im 20/35]
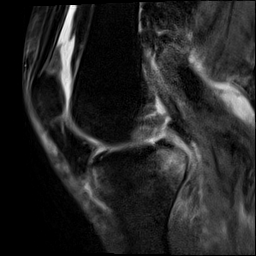
[im 25/35]
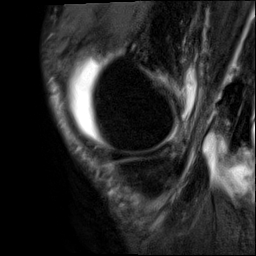
[im 30/35]
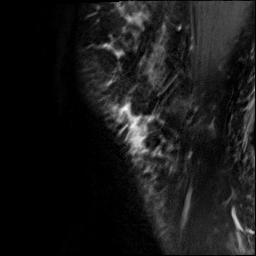
[im 35/35]
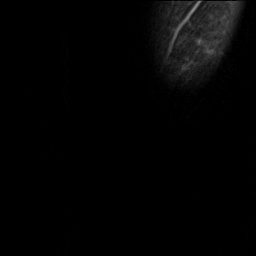

[Series 14: T2 fat-sat · sagittal · left · 3.0mm · 0.59mm/px · 8 of 36 slices shown (3 of 3)]
[im 1/36]
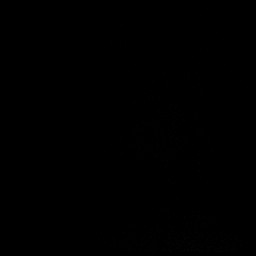
[im 6/36]
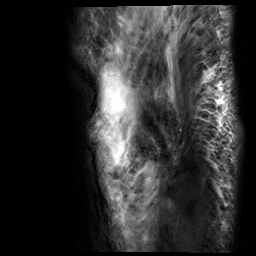
[im 11/36]
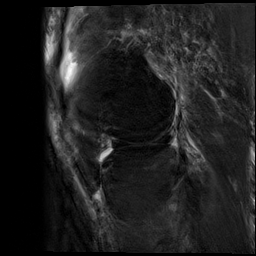
[im 16/36]
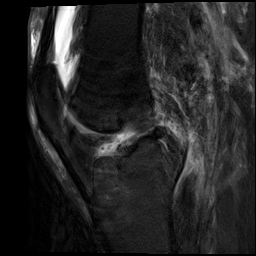
[im 21/36]
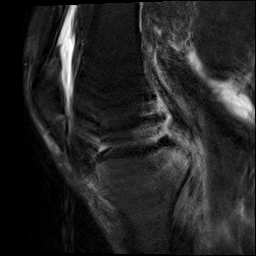
[im 26/36]
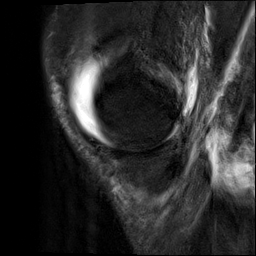
[im 31/36]
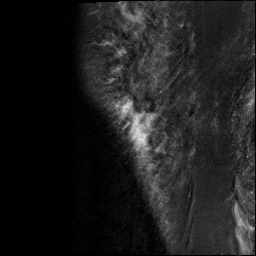
[im 36/36]
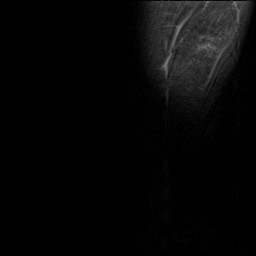

[40 of 40 positions shown; findings below may reference images not displayed]

FINDINGS: Examination is significantly limited by patient motion. Despite
coaching the patient could not hold still.

MENISCI

Medial meniscus: Inferior articular surface tears along the
posterior horn midbody junction region.

Lateral meniscus:  Grossly intact.

LIGAMENTS

Cruciates:  Intact

Collaterals:  Intact

CARTILAGE

Patellofemoral: Limited evaluation. No obvious full-thickness
cartilage defects.

Medial: Very limited examination. No obvious full-thickness
cartilage defects.

Lateral: Limited examination. No obvious full-thickness cartilage
defects.

Joint:  Moderate to large joint effusion and mild synovitis.

Popliteal Fossa:  Moderate-sized leaking Baker's cyst.

Extensor Mechanism: The patella retinacular structures are intact
and the quadriceps and patellar tendons are intact. Mild proximal
and distal patellar tendinopathy.

Bones: Moderate edema like signal changes involving the posterior
aspect of the medial tibia. This could be a bone contusion, stress
reaction or subtle subchondral stress fracture.

Other: Grossly normal knee musculature.
IMPRESSION: 1. Very limited examination due to patient motion.
2. Inferior articular surface tearing involving the posterior horn
mid body junction region of the medial meniscus.
3. Intact ligamentous structures.
4. Suspect mild degenerative chondrosis for age but no gross
cartilage defects or osteochondral lesion.
5. Edema like signal changes in the posterior aspect of the medial
tibia could be a bone contusion, stress reaction or subtle
subchondral stress fracture.
6. Moderate to large joint effusion and moderate-sized leaking
Baker's cyst.

## 2020-03-14 ENCOUNTER — Emergency Department (HOSPITAL_COMMUNITY): Payer: Medicare HMO

## 2020-03-14 ENCOUNTER — Other Ambulatory Visit: Payer: Self-pay

## 2020-03-14 ENCOUNTER — Inpatient Hospital Stay (HOSPITAL_COMMUNITY)
Admission: EM | Admit: 2020-03-14 | Discharge: 2020-03-17 | DRG: 193 | Disposition: A | Discharge status: Home / Self Care | Payer: Medicare HMO | Attending: Internal Medicine | Admitting: Internal Medicine

## 2020-03-14 DIAGNOSIS — G8929 Other chronic pain: Secondary | ICD-10-CM | POA: Diagnosis present

## 2020-03-14 DIAGNOSIS — E119 Type 2 diabetes mellitus without complications: Secondary | ICD-10-CM

## 2020-03-14 DIAGNOSIS — E559 Vitamin D deficiency, unspecified: Secondary | ICD-10-CM | POA: Diagnosis present

## 2020-03-14 DIAGNOSIS — J181 Lobar pneumonia, unspecified organism: Principal | ICD-10-CM | POA: Diagnosis present

## 2020-03-14 DIAGNOSIS — E785 Hyperlipidemia, unspecified: Secondary | ICD-10-CM | POA: Diagnosis present

## 2020-03-14 DIAGNOSIS — I700 Atherosclerosis of aorta: Secondary | ICD-10-CM | POA: Diagnosis present

## 2020-03-14 DIAGNOSIS — K219 Gastro-esophageal reflux disease without esophagitis: Secondary | ICD-10-CM | POA: Diagnosis present

## 2020-03-14 DIAGNOSIS — Z8711 Personal history of peptic ulcer disease: Secondary | ICD-10-CM

## 2020-03-14 DIAGNOSIS — E669 Obesity, unspecified: Secondary | ICD-10-CM | POA: Diagnosis present

## 2020-03-14 DIAGNOSIS — Z8679 Personal history of other diseases of the circulatory system: Secondary | ICD-10-CM

## 2020-03-14 DIAGNOSIS — R262 Difficulty in walking, not elsewhere classified: Secondary | ICD-10-CM | POA: Diagnosis present

## 2020-03-14 DIAGNOSIS — M25562 Pain in left knee: Secondary | ICD-10-CM | POA: Diagnosis present

## 2020-03-14 DIAGNOSIS — I5033 Acute on chronic diastolic (congestive) heart failure: Secondary | ICD-10-CM | POA: Diagnosis present

## 2020-03-14 DIAGNOSIS — Z79899 Other long term (current) drug therapy: Secondary | ICD-10-CM

## 2020-03-14 DIAGNOSIS — I2510 Atherosclerotic heart disease of native coronary artery without angina pectoris: Secondary | ICD-10-CM | POA: Diagnosis present

## 2020-03-14 DIAGNOSIS — F419 Anxiety disorder, unspecified: Secondary | ICD-10-CM | POA: Diagnosis present

## 2020-03-14 DIAGNOSIS — Z880 Allergy status to penicillin: Secondary | ICD-10-CM

## 2020-03-14 DIAGNOSIS — J449 Chronic obstructive pulmonary disease, unspecified: Secondary | ICD-10-CM | POA: Diagnosis present

## 2020-03-14 DIAGNOSIS — D500 Iron deficiency anemia secondary to blood loss (chronic): Secondary | ICD-10-CM | POA: Diagnosis present

## 2020-03-14 DIAGNOSIS — J9621 Acute and chronic respiratory failure with hypoxia: Secondary | ICD-10-CM | POA: Diagnosis present

## 2020-03-14 DIAGNOSIS — I10 Essential (primary) hypertension: Secondary | ICD-10-CM | POA: Diagnosis present

## 2020-03-14 DIAGNOSIS — F418 Other specified anxiety disorders: Secondary | ICD-10-CM | POA: Diagnosis present

## 2020-03-14 DIAGNOSIS — J439 Emphysema, unspecified: Secondary | ICD-10-CM | POA: Diagnosis present

## 2020-03-14 DIAGNOSIS — E1165 Type 2 diabetes mellitus with hyperglycemia: Secondary | ICD-10-CM | POA: Diagnosis present

## 2020-03-14 DIAGNOSIS — N1831 Chronic kidney disease, stage 3a: Secondary | ICD-10-CM | POA: Diagnosis present

## 2020-03-14 DIAGNOSIS — I130 Hypertensive heart and chronic kidney disease with heart failure and stage 1 through stage 4 chronic kidney disease, or unspecified chronic kidney disease: Secondary | ICD-10-CM | POA: Diagnosis present

## 2020-03-14 DIAGNOSIS — Z825 Family history of asthma and other chronic lower respiratory diseases: Secondary | ICD-10-CM

## 2020-03-14 DIAGNOSIS — I252 Old myocardial infarction: Secondary | ICD-10-CM

## 2020-03-14 DIAGNOSIS — Z7401 Bed confinement status: Secondary | ICD-10-CM

## 2020-03-14 DIAGNOSIS — Z833 Family history of diabetes mellitus: Secondary | ICD-10-CM

## 2020-03-14 DIAGNOSIS — Z8719 Personal history of other diseases of the digestive system: Secondary | ICD-10-CM

## 2020-03-14 DIAGNOSIS — J90 Pleural effusion, not elsewhere classified: Secondary | ICD-10-CM

## 2020-03-14 DIAGNOSIS — G4700 Insomnia, unspecified: Secondary | ICD-10-CM | POA: Diagnosis present

## 2020-03-14 DIAGNOSIS — F319 Bipolar disorder, unspecified: Secondary | ICD-10-CM | POA: Diagnosis present

## 2020-03-14 DIAGNOSIS — J189 Pneumonia, unspecified organism: Secondary | ICD-10-CM

## 2020-03-14 DIAGNOSIS — Z794 Long term (current) use of insulin: Secondary | ICD-10-CM

## 2020-03-14 DIAGNOSIS — N179 Acute kidney failure, unspecified: Secondary | ICD-10-CM | POA: Diagnosis not present

## 2020-03-14 DIAGNOSIS — Z8701 Personal history of pneumonia (recurrent): Secondary | ICD-10-CM

## 2020-03-14 DIAGNOSIS — Z87891 Personal history of nicotine dependence: Secondary | ICD-10-CM

## 2020-03-14 DIAGNOSIS — Z8249 Family history of ischemic heart disease and other diseases of the circulatory system: Secondary | ICD-10-CM

## 2020-03-14 DIAGNOSIS — Z9981 Dependence on supplemental oxygen: Secondary | ICD-10-CM

## 2020-03-14 DIAGNOSIS — Z6835 Body mass index (BMI) 35.0-35.9, adult: Secondary | ICD-10-CM

## 2020-03-14 DIAGNOSIS — Z881 Allergy status to other antibiotic agents status: Secondary | ICD-10-CM

## 2020-03-14 DIAGNOSIS — Z951 Presence of aortocoronary bypass graft: Secondary | ICD-10-CM

## 2020-03-14 DIAGNOSIS — J9611 Chronic respiratory failure with hypoxia: Secondary | ICD-10-CM | POA: Diagnosis present

## 2020-03-14 DIAGNOSIS — Z885 Allergy status to narcotic agent status: Secondary | ICD-10-CM

## 2020-03-14 DIAGNOSIS — E1122 Type 2 diabetes mellitus with diabetic chronic kidney disease: Secondary | ICD-10-CM | POA: Diagnosis present

## 2020-03-14 DIAGNOSIS — Z888 Allergy status to other drugs, medicaments and biological substances status: Secondary | ICD-10-CM

## 2020-03-14 DIAGNOSIS — E875 Hyperkalemia: Secondary | ICD-10-CM | POA: Diagnosis present

## 2020-03-14 DIAGNOSIS — Z20822 Contact with and (suspected) exposure to covid-19: Secondary | ICD-10-CM | POA: Diagnosis present

## 2020-03-14 LAB — CBC WITH DIFFERENTIAL/PLATELET
Abs Immature Granulocytes: 0.23 10*3/uL — ABNORMAL HIGH (ref 0.00–0.07)
Basophils Absolute: 0 10*3/uL (ref 0.0–0.1)
Basophils Relative: 0 %
Eosinophils Absolute: 0.1 10*3/uL (ref 0.0–0.5)
Eosinophils Relative: 2 %
HCT: 29 % — ABNORMAL LOW (ref 39.0–52.0)
Hemoglobin: 8.3 g/dL — ABNORMAL LOW (ref 13.0–17.0)
Immature Granulocytes: 3 %
Lymphocytes Relative: 13 %
Lymphs Abs: 0.9 10*3/uL (ref 0.7–4.0)
MCH: 25.5 pg — ABNORMAL LOW (ref 26.0–34.0)
MCHC: 28.6 g/dL — ABNORMAL LOW (ref 30.0–36.0)
MCV: 89 fL (ref 80.0–100.0)
Monocytes Absolute: 0.6 10*3/uL (ref 0.1–1.0)
Monocytes Relative: 9 %
Neutro Abs: 5.1 10*3/uL (ref 1.7–7.7)
Neutrophils Relative %: 73 %
Platelets: 240 10*3/uL (ref 150–400)
RBC: 3.26 MIL/uL — ABNORMAL LOW (ref 4.22–5.81)
RDW: 16.1 % — ABNORMAL HIGH (ref 11.5–15.5)
WBC: 7 10*3/uL (ref 4.0–10.5)
nRBC: 0 % (ref 0.0–0.2)

## 2020-03-14 LAB — BASIC METABOLIC PANEL
Anion gap: 12 (ref 5–15)
BUN: 18 mg/dL (ref 8–23)
CO2: 37 mmol/L — ABNORMAL HIGH (ref 22–32)
Calcium: 8.6 mg/dL — ABNORMAL LOW (ref 8.9–10.3)
Chloride: 89 mmol/L — ABNORMAL LOW (ref 98–111)
Creatinine, Ser: 1.08 mg/dL (ref 0.61–1.24)
GFR calc Af Amer: >60 mL/min (ref >60)
GFR calc non Af Amer: >60 mL/min (ref >60)
Glucose, Bld: 127 mg/dL — ABNORMAL HIGH (ref 70–99)
Potassium: 4.5 mmol/L (ref 3.5–5.1)
Sodium: 138 mmol/L (ref 135–145)

## 2020-03-14 LAB — PHOSPHORUS: Phosphorus: 2.9 mg/dL (ref 2.5–4.6)

## 2020-03-14 LAB — BLOOD GAS, VENOUS
Acid-Base Excess: 12.6 mmol/L — ABNORMAL HIGH (ref 0.0–2.0)
Bicarbonate: 39.7 mmol/L — ABNORMAL HIGH (ref 20.0–28.0)
O2 Saturation: 78.9 %
Patient temperature: 98.6
pCO2, Ven: 64.3 mmHg — ABNORMAL HIGH (ref 44.0–60.0)
pH, Ven: 7.407 (ref 7.250–7.430)
pO2, Ven: 46.1 mmHg — ABNORMAL HIGH (ref 32.0–45.0)

## 2020-03-14 LAB — SARS CORONAVIRUS 2 BY RT PCR (HOSPITAL ORDER, PERFORMED IN ~~LOC~~ HOSPITAL LAB): SARS Coronavirus 2: NEGATIVE

## 2020-03-14 LAB — BRAIN NATRIURETIC PEPTIDE: B Natriuretic Peptide: 255.8 pg/mL — ABNORMAL HIGH (ref 0.0–100.0)

## 2020-03-14 LAB — MAGNESIUM: Magnesium: 1.9 mg/dL (ref 1.7–2.4)

## 2020-03-14 LAB — CBG MONITORING, ED: Glucose-Capillary: 179 mg/dL — ABNORMAL HIGH (ref 70–99)

## 2020-03-14 LAB — TROPONIN I (HIGH SENSITIVITY): Troponin I (High Sensitivity): 10 ng/L (ref <18)

## 2020-03-14 MED ORDER — ONDANSETRON HCL 4 MG/2ML IJ SOLN: 4.0000 mg | Freq: Four times a day (QID) | INTRAMUSCULAR | Status: DC | PRN

## 2020-03-14 MED ORDER — SENNA 8.6 MG PO TABS
1.0000 | ORAL_TABLET | Freq: Two times a day (BID) | ORAL | Status: DC
  Administered 2020-03-15 – 2020-03-17 (×5): 8.6 mg via ORAL
  Filled 2020-03-14 (×5): qty 1

## 2020-03-14 MED ORDER — ONDANSETRON HCL 4 MG PO TABS
4.0000 mg | ORAL_TABLET | Freq: Four times a day (QID) | ORAL | Status: DC | PRN
  Administered 2020-03-17: 4 mg via ORAL
  Filled 2020-03-14: qty 1

## 2020-03-14 MED ORDER — METHYLPREDNISOLONE SODIUM SUCC 125 MG IJ SOLR
125.0000 mg | Freq: Once | INTRAMUSCULAR | Status: AC
  Administered 2020-03-14: 125 mg via INTRAVENOUS
  Filled 2020-03-14: qty 2

## 2020-03-14 MED ORDER — ACETAMINOPHEN 325 MG PO TABS
650.0000 mg | ORAL_TABLET | Freq: Four times a day (QID) | ORAL | Status: DC | PRN
  Administered 2020-03-15 – 2020-03-16 (×3): 650 mg via ORAL
  Filled 2020-03-14 (×3): qty 2

## 2020-03-14 MED ORDER — INSULIN ASPART 100 UNIT/ML ~~LOC~~ SOLN
0.0000 [IU] | Freq: Three times a day (TID) | SUBCUTANEOUS | Status: DC
  Administered 2020-03-15: 7 [IU] via SUBCUTANEOUS
  Administered 2020-03-15: 11 [IU] via SUBCUTANEOUS
  Administered 2020-03-15: 15 [IU] via SUBCUTANEOUS
  Administered 2020-03-16: 7 [IU] via SUBCUTANEOUS
  Administered 2020-03-16 (×2): 4 [IU] via SUBCUTANEOUS
  Administered 2020-03-17: 3 [IU] via SUBCUTANEOUS
  Administered 2020-03-17: 4 [IU] via SUBCUTANEOUS
  Filled 2020-03-14: qty 0.2

## 2020-03-14 MED ORDER — SODIUM CHLORIDE 0.9% IV SOLUTION: Freq: Once | INTRAVENOUS | Status: DC

## 2020-03-14 MED ORDER — SODIUM CHLORIDE 0.9 % IV SOLN
500.0000 mg | Freq: Once | INTRAVENOUS | Status: AC
  Administered 2020-03-14: 500 mg via INTRAVENOUS
  Filled 2020-03-14: qty 500

## 2020-03-14 MED ORDER — FUROSEMIDE 10 MG/ML IJ SOLN
40.0000 mg | Freq: Once | INTRAMUSCULAR | Status: AC
  Administered 2020-03-14: 40 mg via INTRAVENOUS
  Filled 2020-03-14: qty 4

## 2020-03-14 MED ORDER — SODIUM CHLORIDE 0.9 % IV SOLN
2.0000 g | Freq: Once | INTRAVENOUS | Status: AC
  Administered 2020-03-14: 2 g via INTRAVENOUS
  Filled 2020-03-14: qty 2

## 2020-03-14 MED ORDER — ACETAMINOPHEN 650 MG RE SUPP: 650.0000 mg | Freq: Four times a day (QID) | RECTAL | Status: DC | PRN

## 2020-03-14 MED ORDER — FERROUS SULFATE 325 (65 FE) MG PO TABS
325.0000 mg | ORAL_TABLET | Freq: Two times a day (BID) | ORAL | Status: DC
  Administered 2020-03-15 – 2020-03-17 (×5): 325 mg via ORAL
  Filled 2020-03-14 (×5): qty 1

## 2020-03-14 NOTE — ED Triage Notes (Signed)
Pt BIB EMS from home. Pt c/o fatigue and SOB. Pt has COPD. Pt uses 6L [place] at home for home O2 regimen and ambulates with walker. Pt has hx of chronic fatigue and SOB. Pt seen at [facility] a week ago and pt reported he was anemic and left AMA during blood transfusion. Pt refused IV access with EMS. Pt refused to be taken to [facility], so EMS brought pt here to [facility].   108/60 98% 6L  70 HR CBG 140
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
## 2020-03-14 NOTE — Progress Notes (Signed)
A consult was received from an ED provider for Cefepime per pharmacy dosing.  The patient's profile has been reviewed for ht/wt/allergies/indication/available labs.    A one time order has already been placed by provider for Cefepime 2g IV.  Further antibiotics/pharmacy consults should be ordered by admitting physician if indicated.                       Thank you, Luiz Ochoa 03/14/2020  8:15 PM
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
## 2020-03-15 ENCOUNTER — Observation Stay (HOSPITAL_COMMUNITY): Payer: Medicare HMO

## 2020-03-15 ENCOUNTER — Encounter (HOSPITAL_COMMUNITY): Payer: Self-pay | Admitting: Internal Medicine

## 2020-03-15 DIAGNOSIS — E1165 Type 2 diabetes mellitus with hyperglycemia: Secondary | ICD-10-CM | POA: Diagnosis present

## 2020-03-15 DIAGNOSIS — I2510 Atherosclerotic heart disease of native coronary artery without angina pectoris: Secondary | ICD-10-CM | POA: Diagnosis present

## 2020-03-15 DIAGNOSIS — N1831 Chronic kidney disease, stage 3a: Secondary | ICD-10-CM | POA: Diagnosis present

## 2020-03-15 DIAGNOSIS — E875 Hyperkalemia: Secondary | ICD-10-CM

## 2020-03-15 DIAGNOSIS — F419 Anxiety disorder, unspecified: Secondary | ICD-10-CM | POA: Diagnosis present

## 2020-03-15 DIAGNOSIS — F418 Other specified anxiety disorders: Secondary | ICD-10-CM | POA: Diagnosis present

## 2020-03-15 DIAGNOSIS — N179 Acute kidney failure, unspecified: Secondary | ICD-10-CM | POA: Diagnosis not present

## 2020-03-15 DIAGNOSIS — J9611 Chronic respiratory failure with hypoxia: Secondary | ICD-10-CM

## 2020-03-15 DIAGNOSIS — F319 Bipolar disorder, unspecified: Secondary | ICD-10-CM | POA: Diagnosis present

## 2020-03-15 DIAGNOSIS — J439 Emphysema, unspecified: Secondary | ICD-10-CM | POA: Diagnosis present

## 2020-03-15 DIAGNOSIS — Z7401 Bed confinement status: Secondary | ICD-10-CM | POA: Diagnosis not present

## 2020-03-15 DIAGNOSIS — I130 Hypertensive heart and chronic kidney disease with heart failure and stage 1 through stage 4 chronic kidney disease, or unspecified chronic kidney disease: Secondary | ICD-10-CM | POA: Diagnosis present

## 2020-03-15 DIAGNOSIS — I5031 Acute diastolic (congestive) heart failure: Secondary | ICD-10-CM | POA: Diagnosis not present

## 2020-03-15 DIAGNOSIS — J90 Pleural effusion, not elsewhere classified: Secondary | ICD-10-CM

## 2020-03-15 DIAGNOSIS — M25562 Pain in left knee: Secondary | ICD-10-CM | POA: Diagnosis present

## 2020-03-15 DIAGNOSIS — G8929 Other chronic pain: Secondary | ICD-10-CM | POA: Diagnosis present

## 2020-03-15 DIAGNOSIS — G4700 Insomnia, unspecified: Secondary | ICD-10-CM | POA: Diagnosis present

## 2020-03-15 DIAGNOSIS — J181 Lobar pneumonia, unspecified organism: Secondary | ICD-10-CM | POA: Diagnosis present

## 2020-03-15 DIAGNOSIS — E785 Hyperlipidemia, unspecified: Secondary | ICD-10-CM | POA: Diagnosis present

## 2020-03-15 DIAGNOSIS — Z20822 Contact with and (suspected) exposure to covid-19: Secondary | ICD-10-CM | POA: Diagnosis present

## 2020-03-15 DIAGNOSIS — K219 Gastro-esophageal reflux disease without esophagitis: Secondary | ICD-10-CM | POA: Diagnosis present

## 2020-03-15 DIAGNOSIS — R262 Difficulty in walking, not elsewhere classified: Secondary | ICD-10-CM | POA: Diagnosis present

## 2020-03-15 DIAGNOSIS — J9621 Acute and chronic respiratory failure with hypoxia: Secondary | ICD-10-CM | POA: Diagnosis present

## 2020-03-15 DIAGNOSIS — I5033 Acute on chronic diastolic (congestive) heart failure: Secondary | ICD-10-CM | POA: Diagnosis present

## 2020-03-15 DIAGNOSIS — Z9981 Dependence on supplemental oxygen: Secondary | ICD-10-CM | POA: Diagnosis not present

## 2020-03-15 DIAGNOSIS — D500 Iron deficiency anemia secondary to blood loss (chronic): Secondary | ICD-10-CM | POA: Diagnosis present

## 2020-03-15 DIAGNOSIS — E1122 Type 2 diabetes mellitus with diabetic chronic kidney disease: Secondary | ICD-10-CM | POA: Diagnosis present

## 2020-03-15 LAB — URINALYSIS, ROUTINE W REFLEX MICROSCOPIC
Bacteria, UA: NONE SEEN
Bilirubin Urine: NEGATIVE
Glucose, UA: 150 mg/dL — AB
Hgb urine dipstick: NEGATIVE
Ketones, ur: NEGATIVE mg/dL
Leukocytes,Ua: NEGATIVE
Nitrite: NEGATIVE
Protein, ur: NEGATIVE mg/dL
Specific Gravity, Urine: 1.009 (ref 1.005–1.030)
pH: 8 (ref 5.0–8.0)

## 2020-03-15 LAB — COMPREHENSIVE METABOLIC PANEL
ALT: 9 U/L (ref 0–44)
AST: 30 U/L (ref 15–41)
Albumin: 3.6 g/dL (ref 3.5–5.0)
Alkaline Phosphatase: 105 U/L (ref 38–126)
Anion gap: 13 (ref 5–15)
BUN: 24 mg/dL — ABNORMAL HIGH (ref 8–23)
CO2: 34 mmol/L — ABNORMAL HIGH (ref 22–32)
Calcium: 8.8 mg/dL — ABNORMAL LOW (ref 8.9–10.3)
Chloride: 87 mmol/L — ABNORMAL LOW (ref 98–111)
Creatinine, Ser: 1.47 mg/dL — ABNORMAL HIGH (ref 0.61–1.24)
GFR calc Af Amer: 52 mL/min — ABNORMAL LOW (ref >60)
GFR calc non Af Amer: 45 mL/min — ABNORMAL LOW (ref >60)
Glucose, Bld: 360 mg/dL — ABNORMAL HIGH (ref 70–99)
Potassium: 5.4 mmol/L — ABNORMAL HIGH (ref 3.5–5.1)
Sodium: 134 mmol/L — ABNORMAL LOW (ref 135–145)
Total Bilirubin: 0.7 mg/dL (ref 0.3–1.2)
Total Protein: 7.3 g/dL (ref 6.5–8.1)

## 2020-03-15 LAB — CBC
HCT: 33.1 % — ABNORMAL LOW (ref 39.0–52.0)
Hemoglobin: 9.7 g/dL — ABNORMAL LOW (ref 13.0–17.0)
MCH: 25.4 pg — ABNORMAL LOW (ref 26.0–34.0)
MCHC: 29.3 g/dL — ABNORMAL LOW (ref 30.0–36.0)
MCV: 86.6 fL (ref 80.0–100.0)
Platelets: 168 10*3/uL (ref 150–400)
RBC: 3.82 MIL/uL — ABNORMAL LOW (ref 4.22–5.81)
RDW: 16.1 % — ABNORMAL HIGH (ref 11.5–15.5)
WBC: 8 10*3/uL (ref 4.0–10.5)
nRBC: 0 % (ref 0.0–0.2)

## 2020-03-15 LAB — ECHOCARDIOGRAM COMPLETE
Height: 75 in
Weight: 4356.29 oz

## 2020-03-15 LAB — STREP PNEUMONIAE URINARY ANTIGEN: Strep Pneumo Urinary Antigen: NEGATIVE

## 2020-03-15 LAB — HEMOGLOBIN A1C
Hgb A1c MFr Bld: 6.7 % — ABNORMAL HIGH (ref 4.8–5.6)
Mean Plasma Glucose: 145.59 mg/dL

## 2020-03-15 LAB — GLUCOSE, CAPILLARY
Glucose-Capillary: 282 mg/dL — ABNORMAL HIGH (ref 70–99)
Glucose-Capillary: 301 mg/dL — ABNORMAL HIGH (ref 70–99)
Glucose-Capillary: 208 mg/dL — ABNORMAL HIGH (ref 70–99)
Glucose-Capillary: 228 mg/dL — ABNORMAL HIGH (ref 70–99)

## 2020-03-15 LAB — PREPARE RBC (CROSSMATCH)

## 2020-03-15 MED ORDER — FUROSEMIDE 20 MG PO TABS
20.0000 mg | ORAL_TABLET | Freq: Two times a day (BID) | ORAL | Status: DC
  Administered 2020-03-15 – 2020-03-17 (×4): 20 mg via ORAL
  Filled 2020-03-15 (×4): qty 1

## 2020-03-15 MED ORDER — INSULIN GLARGINE 100 UNIT/ML ~~LOC~~ SOLN
35.0000 [IU] | Freq: Once | SUBCUTANEOUS | Status: AC
  Administered 2020-03-15: 35 [IU] via SUBCUTANEOUS
  Filled 2020-03-15: qty 0.35

## 2020-03-15 MED ORDER — CITALOPRAM HYDROBROMIDE 20 MG PO TABS
40.0000 mg | ORAL_TABLET | Freq: Every day | ORAL | Status: DC
  Administered 2020-03-15 – 2020-03-17 (×3): 40 mg via ORAL
  Filled 2020-03-15 (×3): qty 2

## 2020-03-15 MED ORDER — AZITHROMYCIN 250 MG PO TABS
500.0000 mg | ORAL_TABLET | Freq: Every day | ORAL | Status: DC
  Administered 2020-03-15 – 2020-03-17 (×3): 500 mg via ORAL
  Filled 2020-03-15 (×4): qty 2

## 2020-03-15 MED ORDER — FUROSEMIDE 10 MG/ML IJ SOLN
20.0000 mg | Freq: Two times a day (BID) | INTRAMUSCULAR | Status: DC
  Administered 2020-03-15: 20 mg via INTRAVENOUS
  Filled 2020-03-15: qty 2

## 2020-03-15 MED ORDER — ALPRAZOLAM 0.25 MG PO TABS: 0.2500 mg | ORAL_TABLET | Freq: Every day | ORAL | Status: DC | PRN

## 2020-03-15 MED ORDER — LISINOPRIL 5 MG PO TABS
2.5000 mg | ORAL_TABLET | Freq: Every day | ORAL | Status: DC
  Filled 2020-03-15: qty 1

## 2020-03-15 MED ORDER — FLUTICASONE PROPIONATE 50 MCG/ACT NA SUSP
2.0000 | Freq: Every day | NASAL | Status: DC | PRN
  Filled 2020-03-15: qty 16

## 2020-03-15 MED ORDER — ZOLPIDEM TARTRATE 5 MG PO TABS
5.0000 mg | ORAL_TABLET | Freq: Every evening | ORAL | Status: DC | PRN
  Administered 2020-03-15 – 2020-03-16 (×2): 5 mg via ORAL
  Filled 2020-03-15 (×2): qty 1

## 2020-03-15 MED ORDER — SODIUM CHLORIDE 0.9 % IV SOLN
2.0000 g | INTRAVENOUS | Status: DC
  Administered 2020-03-15 – 2020-03-17 (×3): 2 g via INTRAVENOUS
  Filled 2020-03-15 (×3): qty 2

## 2020-03-15 MED ORDER — INSULIN GLARGINE 100 UNIT/ML ~~LOC~~ SOLN
35.0000 [IU] | Freq: Two times a day (BID) | SUBCUTANEOUS | Status: DC
  Filled 2020-03-15: qty 0.35

## 2020-03-15 MED ORDER — PANTOPRAZOLE SODIUM 40 MG PO TBEC
40.0000 mg | DELAYED_RELEASE_TABLET | Freq: Two times a day (BID) | ORAL | Status: DC
  Administered 2020-03-15 – 2020-03-17 (×6): 40 mg via ORAL
  Filled 2020-03-15 (×6): qty 1

## 2020-03-15 MED ORDER — BENZONATATE 100 MG PO CAPS
100.0000 mg | ORAL_CAPSULE | Freq: Three times a day (TID) | ORAL | Status: DC | PRN
  Administered 2020-03-16 – 2020-03-17 (×2): 100 mg via ORAL
  Filled 2020-03-15 (×2): qty 1

## 2020-03-15 MED ORDER — GABAPENTIN 400 MG PO CAPS
400.0000 mg | ORAL_CAPSULE | Freq: Four times a day (QID) | ORAL | Status: DC
  Administered 2020-03-15 – 2020-03-17 (×9): 400 mg via ORAL
  Filled 2020-03-15 (×9): qty 1

## 2020-03-15 MED ORDER — PROPRANOLOL HCL 20 MG PO TABS
20.0000 mg | ORAL_TABLET | Freq: Every day | ORAL | Status: DC
  Administered 2020-03-15 – 2020-03-17 (×3): 20 mg via ORAL
  Filled 2020-03-15 (×3): qty 1

## 2020-03-15 MED ORDER — INSULIN GLARGINE 100 UNIT/ML ~~LOC~~ SOLN
20.0000 [IU] | Freq: Two times a day (BID) | SUBCUTANEOUS | Status: DC
  Administered 2020-03-15 – 2020-03-17 (×4): 20 [IU] via SUBCUTANEOUS
  Filled 2020-03-15 (×4): qty 0.2

## 2020-03-15 MED ORDER — UMECLIDINIUM BROMIDE 62.5 MCG/INH IN AEPB
1.0000 | INHALATION_SPRAY | Freq: Every day | RESPIRATORY_TRACT | Status: DC
  Administered 2020-03-15 – 2020-03-17 (×3): 1 via RESPIRATORY_TRACT
  Filled 2020-03-15: qty 7

## 2020-03-15 MED ORDER — ATORVASTATIN CALCIUM 40 MG PO TABS
40.0000 mg | ORAL_TABLET | Freq: Every day | ORAL | Status: DC
  Administered 2020-03-15 – 2020-03-16 (×2): 40 mg via ORAL
  Filled 2020-03-15 (×2): qty 1

## 2020-03-15 MED ORDER — MOMETASONE FURO-FORMOTEROL FUM 200-5 MCG/ACT IN AERO
2.0000 | INHALATION_SPRAY | Freq: Two times a day (BID) | RESPIRATORY_TRACT | Status: DC
  Administered 2020-03-15 – 2020-03-17 (×5): 2 via RESPIRATORY_TRACT
  Filled 2020-03-15: qty 8.8

## 2020-03-15 NOTE — Progress Notes (Signed)
Pt informed nurse that he needed to have a bowel movement. Pt told nurse he was too weak to get on the [facility] Nurse offered to get a bedpan and pt stated that he could not get on the bedpan because it hurts. States he will just have a BM in the bed.

## 2020-03-15 NOTE — Progress Notes (Signed)
PROGRESS NOTE  [name] [id number] DOB: [birth year] DOA: 03/14/2020 PCP: [name], MD  Brief History   79 year old man presented with shortness of breath, productive cough and worsening fatigue, orthopnea, lower extremity edema.  Admitted for lobar pneumonia, acute on chronic diastolic CHF.  A & P  Acute lobar pneumonia, left lower lobe with shortness of breath, complicated by COPD, chronic hypoxic respiratory failure on 6 L nasal cannula at home. --Oxygenation appears to be stable but does not appear to be at respiratory baseline.  Poor breath sounds.  Continue empiric antibiotics, bronchodilators, supplemental oxygen. --Follow-up chest x-ray in 3 to 4 weeks to ensure resolution.  If fails to improve, recheck chest x-ray to reevaluate left lower lobe effusion.  Acute on chronic diastolic CHF --Appears to be euvolemic at this point. --Change to oral Lasix for tomorrow.  AKI with modest hyperkalemia, superimposed on CKD stage IIIa --stop ACE-I, hold further Lasix today --repeat BMP in AM  Chronic fatigue, essentially bedbound status reportedly secondary to left knee pain --PT evaluation  Symptomatic iron deficiency anemia secondary to chronic blood loss --Status post 1 unit PRBC with appropriate response in hemoglobin.  Recheck CBC in a.m.  Chronic left knee pain since October 2020 --MRI October 2020 left knee showed inferior articular surface tearing involving posterior horn, possible medial tibial bone contusion, stress reaction or subtle subchondral stress fracture, moderate to large joint effusion --Knee films this admission no acute abnormalities.  No effusion present on examination or imaging.  Exam benign. --PT evaluation.  Continue gabapentin.  Multiple allergies/intolerances.  Noted.  Diabetes mellitus type 2 --Reportedly not using Lantus at home.  Given Lantus x1. --hyperglycemic -- will resume BID dosing  Bipolar 1 disorder --appears stable.  PMH  peptic ulcer disease, duodenal ulcer, hiatal hernia, esophagitis, Cameron ulcer, gastric polyps, adenomatous duodenal polyp, AVM of the colon with hemorrhage  Chart review recent presentation to [facility], found to be anemic, left AMA during blood transfusion.  Aortic atherosclerosis --Continue statin.  Disposition Plan:  Discussion: 79 year old male with chronic hypoxic respiratory failure presented with shortness of breath, continue empiric antibiotics, supplemental oxygenation and bronchodilators.  Continue to require further IV treatment and close monitoring of respiratory status and kidney function.  PT evaluation.  Status is: Observation  The patient will require care spanning > 2 midnights and should be moved to inpatient because: IV treatments appropriate due to intensity of illness or inability to take PO  Dispo: The patient is from: Home              Anticipated d/c is to: TBD -- reported nearly bedbound at home from knee pain -- suspect will be best served with SNF              Anticipated d/c date is: 1-2 days              Patient currently is not medically stable to d/c.  DVT prophylaxis: SCDs Code Status: Full Family Communication: none present, none requested    Murray Hodgkins, MD  [facility] Direct contact: see [email or web address] (further directions at bottom of note if needed) 7PM-7AM contact night coverage as at bottom of note 03/15/2020, 3:43 PM  LOS: 0 days   Consults:  .    Procedures:  . 5/29 1 unit PRBC transfusion  Significant Diagnostic Tests:  . Chest x-ray pneumonia left base with small left pleural effusion . Left knee films negative. . 2D echocardiogram LVEF 55 to 60%.  Grade 1 diastolic dysfunction.  Right ventricular systolic function normal.   Micro Data:  . SARS-CoV-2 negative   Antimicrobials:  . Azithromycin 5/29-- . Ceftriaxone 5/29--  Interval History/Subjective  Multiple complaints today, however dominant complaint is left knee  pain present since a fall approximately 7 months ago.  "Find out why my knee is hurting, I wanted it to be cured."  Reports he lives at home with his wife, has been essentially bedbound for months secondary to left knee pain.  Reportedly had orthopedic evaluation scheduled for 5/31.  Reports he called the ambulance for shortness of breath.  Shortness of breath appears to be improved.  On home oxygen.  Reports recently treated for GI bleed, also diagnosed in the past with multiple ulcers, frustrated that previous physicians did not find ulcers on prior endoscopies.  "I was watching a lifetime TV, and I also watch House, these shows where doctors cannot figure things out, but the patients do."  Objective   Vitals:  Vitals:   03/15/20 1115 03/15/20 1208  BP: (!) 154/82 140/90  Pulse: 98 100  Resp: 16 18  Temp: 98.8 F (37.1 C) 97.7 F (36.5 C)  SpO2: 95% 95%    Exam:  Constitutional.  Appears calm, mildly uncomfortable, nontoxic. ENT.  Hearing grossly normal.  Lips and tongue appear unremarkable. Respiratory.  Poor air movement.  No frank wheezes, rales or rhonchi.  Mild increased respiratory effort.   Cardiovascular.  Heart sounds distant.  Regular rate and rhythm.  No murmur, rub or gallop appreciated.  No significant lower extremity edema noted. Abdomen.  Soft, nontender, nondistended. Skin.  Left knee appears unremarkable.  Left leg appears unremarkable. Musculoskeletal.  Able to lift right leg and bend right knee adequately.  Reports pain in left knee, decreased strength noted reportedly from pain, with decreased ability to flex the knee.  The knee itself appears unremarkable without obvious swelling or effusion and is nontender to palpation.  No gross deformities noted. Psychiatric.  Appears frustrated.  Speech fluent and appropriate.  I have personally reviewed the following:   Today's Data  . Left knee films negative. Marland Kitchen glucose 360, HgbA1c 6.7 . Hemoglobin up to 9.7 status  post PRBC 1 unit. Marland Kitchen Potassium 5.4, no reported hemolysis, creatinine up to 1.47.  LFTs unremarkable.  Scheduled Meds: . sodium chloride   Intravenous Once  . atorvastatin  40 mg Oral q1800  . azithromycin  500 mg Oral Daily  . citalopram  40 mg Oral Daily  . ferrous sulfate  325 mg Oral BID WC  . furosemide  20 mg Oral BID  . gabapentin  400 mg Oral QID  . insulin aspart  0-20 Units Subcutaneous TID WC  . insulin glargine  35 Units Subcutaneous BID  . mometasone-formoterol  2 puff Inhalation BID  . pantoprazole  40 mg Oral BID  . propranolol  20 mg Oral Daily  . senna  1 tablet Oral BID AC  . umeclidinium bromide  1 puff Inhalation Daily   Continuous Infusions: . cefTRIAXone (ROCEPHIN)  IV 2 g (03/15/20 0518)    Principal Problem:   Lobar pneumonia (HCC) Active Problems:   Hyperlipidemia   Essential hypertension   COPD (chronic obstructive pulmonary disease) (HCC)   CAD (coronary artery disease)   Chronic hypoxemic respiratory failure (HCC)   Insomnia   Iron deficiency anemia due to chronic blood loss   AKI (acute kidney injury) ([place])   Type 2 diabetes mellitus (HCC)   Acute on chronic diastolic CHF (congestive heart failure) (  [place])   Anxiety with depression   Hyperkalemia   Left knee pain   LOS: 0 days   How to contact the [facility] Attending or Consulting provider [place] or covering provider during after hours [place], for this patient?  1. Check the care team in [facility] and look for a) attending/consulting [facility] provider listed and b) the [facility] team listed 2. Log into [email or web address] and use [place]'s universal password to access. If you do not have the password, please contact the hospital operator. 3. Locate the [facility] provider you are looking for under [facility] and page to a number that you can be directly reached. 4. If you still have difficulty reaching the provider, please page the [facility] (Director on Call) for the Hospitalists listed on amion for assistance.

## 2020-03-15 NOTE — Progress Notes (Signed)
RN asked patient if ok to collect urine sample to obtain a UA. Patient responded with you can touch my penis in any way you like. RN ignored patient's comment. He later discussed sex and porn. RN asked patient to not discuss these topics as they are inappropriate.

## 2020-03-15 NOTE — Progress Notes (Signed)
  Echocardiogram 2D Echocardiogram has been performed.  Chelsea A Turrentine 03/15/2020, 11:52 AM

## 2020-03-16 DIAGNOSIS — G8929 Other chronic pain: Secondary | ICD-10-CM

## 2020-03-16 DIAGNOSIS — D500 Iron deficiency anemia secondary to blood loss (chronic): Secondary | ICD-10-CM

## 2020-03-16 DIAGNOSIS — I2510 Atherosclerotic heart disease of native coronary artery without angina pectoris: Secondary | ICD-10-CM

## 2020-03-16 DIAGNOSIS — I5033 Acute on chronic diastolic (congestive) heart failure: Secondary | ICD-10-CM

## 2020-03-16 DIAGNOSIS — I10 Essential (primary) hypertension: Secondary | ICD-10-CM

## 2020-03-16 DIAGNOSIS — M25562 Pain in left knee: Secondary | ICD-10-CM

## 2020-03-16 DIAGNOSIS — J9621 Acute and chronic respiratory failure with hypoxia: Secondary | ICD-10-CM

## 2020-03-16 DIAGNOSIS — J449 Chronic obstructive pulmonary disease, unspecified: Secondary | ICD-10-CM

## 2020-03-16 DIAGNOSIS — J181 Lobar pneumonia, unspecified organism: Principal | ICD-10-CM

## 2020-03-16 DIAGNOSIS — E1121 Type 2 diabetes mellitus with diabetic nephropathy: Secondary | ICD-10-CM

## 2020-03-16 DIAGNOSIS — N1831 Chronic kidney disease, stage 3a: Secondary | ICD-10-CM

## 2020-03-16 DIAGNOSIS — N179 Acute kidney failure, unspecified: Secondary | ICD-10-CM

## 2020-03-16 LAB — GLUCOSE, CAPILLARY
Glucose-Capillary: 220 mg/dL — ABNORMAL HIGH (ref 70–99)
Glucose-Capillary: 196 mg/dL — ABNORMAL HIGH (ref 70–99)
Glucose-Capillary: 179 mg/dL — ABNORMAL HIGH (ref 70–99)
Glucose-Capillary: 176 mg/dL — ABNORMAL HIGH (ref 70–99)

## 2020-03-16 LAB — CBC
HCT: 32.5 % — ABNORMAL LOW (ref 39.0–52.0)
Hemoglobin: 9.4 g/dL — ABNORMAL LOW (ref 13.0–17.0)
MCH: 25.5 pg — ABNORMAL LOW (ref 26.0–34.0)
MCHC: 28.9 g/dL — ABNORMAL LOW (ref 30.0–36.0)
MCV: 88.3 fL (ref 80.0–100.0)
Platelets: 231 10*3/uL (ref 150–400)
RBC: 3.68 MIL/uL — ABNORMAL LOW (ref 4.22–5.81)
RDW: 15.9 % — ABNORMAL HIGH (ref 11.5–15.5)
WBC: 11.2 10*3/uL — ABNORMAL HIGH (ref 4.0–10.5)
nRBC: 0.2 % (ref 0.0–0.2)

## 2020-03-16 LAB — BASIC METABOLIC PANEL
Anion gap: 15 (ref 5–15)
BUN: 33 mg/dL — ABNORMAL HIGH (ref 8–23)
CO2: 31 mmol/L (ref 22–32)
Calcium: 8.5 mg/dL — ABNORMAL LOW (ref 8.9–10.3)
Chloride: 87 mmol/L — ABNORMAL LOW (ref 98–111)
Creatinine, Ser: 1.29 mg/dL — ABNORMAL HIGH (ref 0.61–1.24)
GFR calc Af Amer: >60 mL/min (ref >60)
GFR calc non Af Amer: 53 mL/min — ABNORMAL LOW (ref >60)
Glucose, Bld: 282 mg/dL — ABNORMAL HIGH (ref 70–99)
Potassium: 4.2 mmol/L (ref 3.5–5.1)
Sodium: 133 mmol/L — ABNORMAL LOW (ref 135–145)

## 2020-03-16 LAB — TYPE AND SCREEN
ABO/RH(D): B NEG
Antibody Screen: NEGATIVE
Unit division: 0

## 2020-03-16 LAB — BPAM RBC
Blood Product Expiration Date: 202106192359
ISSUE DATE / TIME: 202105300803
Unit Type and Rh: 1700

## 2020-03-16 IMAGING — CT CT ADDITIONAL VIEWS AT NO CHARGE
1 of 2 series · 9 of 12 positions shown, 15 images · IV contrast (APPLIED)
Comparison: None.

CLINICAL DATA: Inpatient.  Dyspnea.

EXAM:
CT ADDITIONAL VIEWS AT NO CHARGE
TECHNIQUE: Chest CT angiogram attempted. Reported 50 cc Omnipaque 350 left
forearm extravasation.
CONTRAST:  50 cc Omnipaque 350 IV.

[Series 3: monitoring 10.0 b30s · axial · 0.62mm/px · z∈[+153,+153]mm · 9 of 11 slices shown, 15 images]
[im 2/11  soft-tissue]
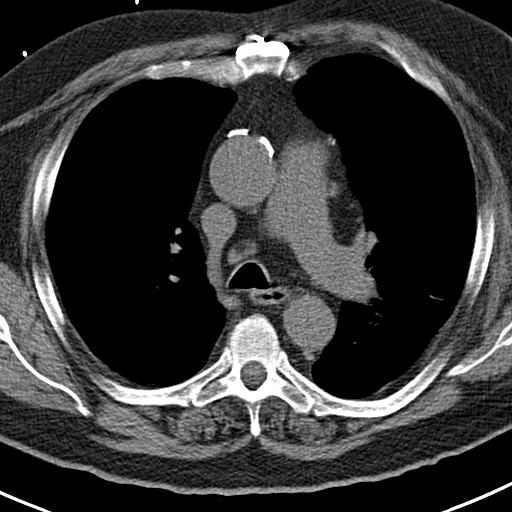
[im 2/11  lung]
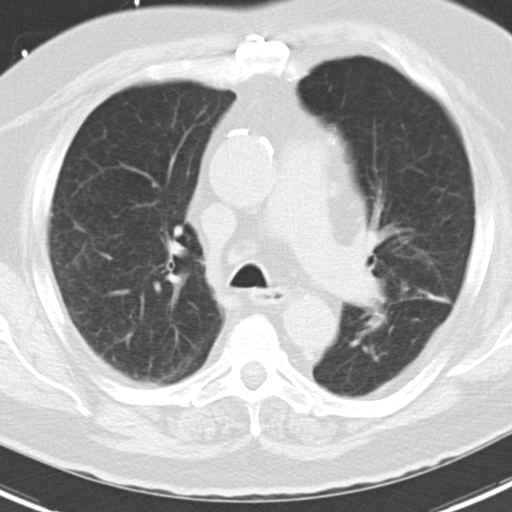
[im 2/11  bone]
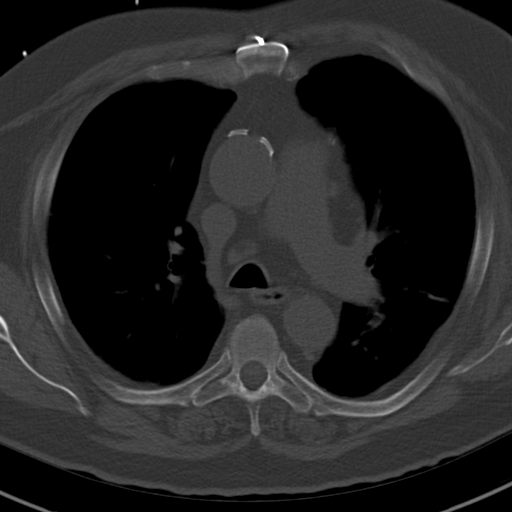
[im 3/11  soft-tissue]
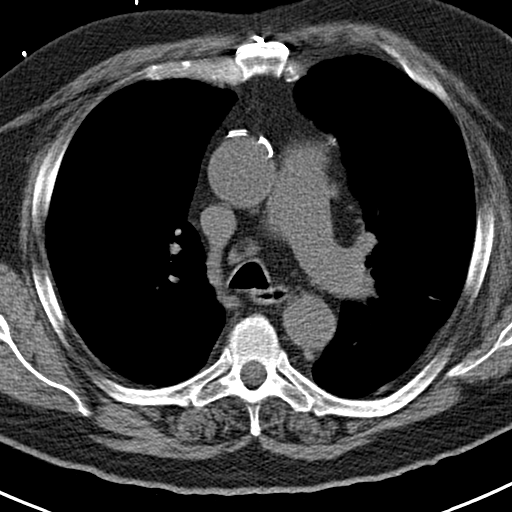
[im 3/11  lung]
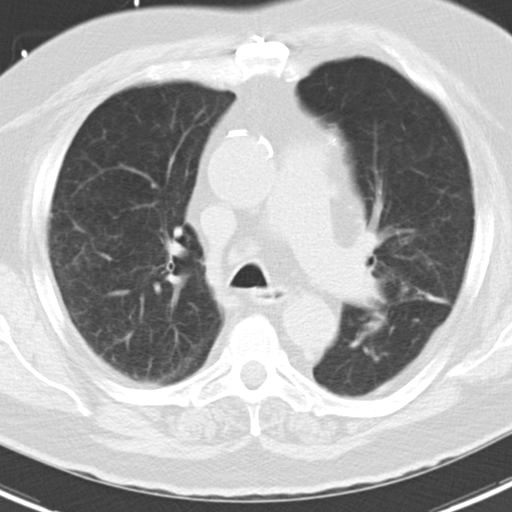
[im 4/11  soft-tissue]
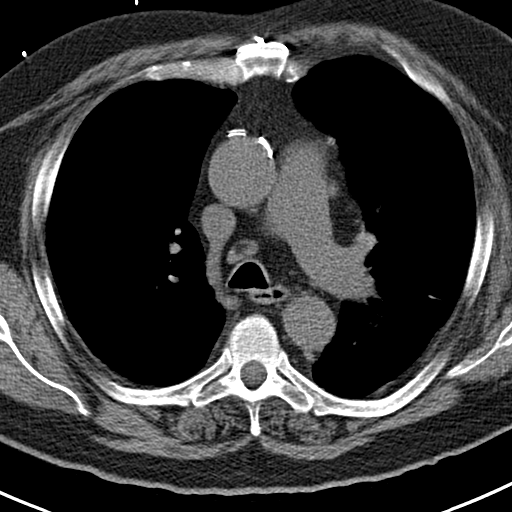
[im 4/11  lung]
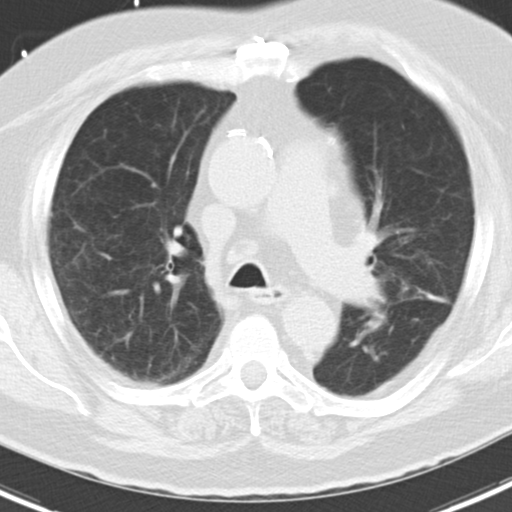
[im 5/11  soft-tissue]
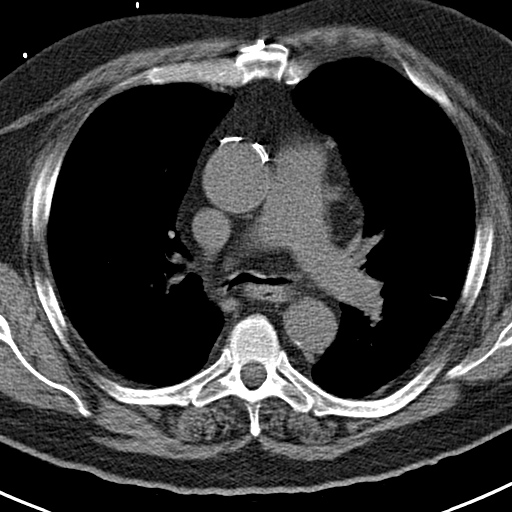
[im 5/11  lung]
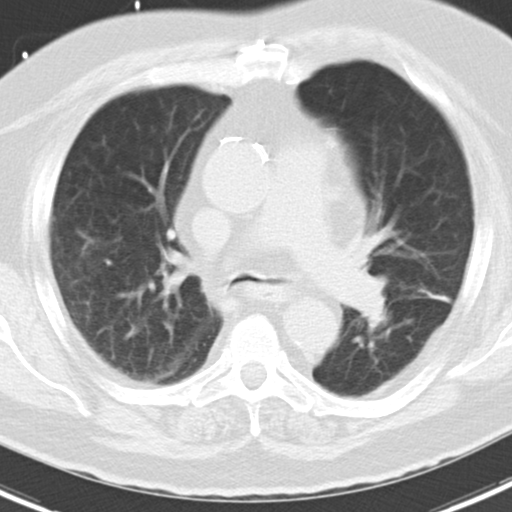
[im 6/11  soft-tissue]
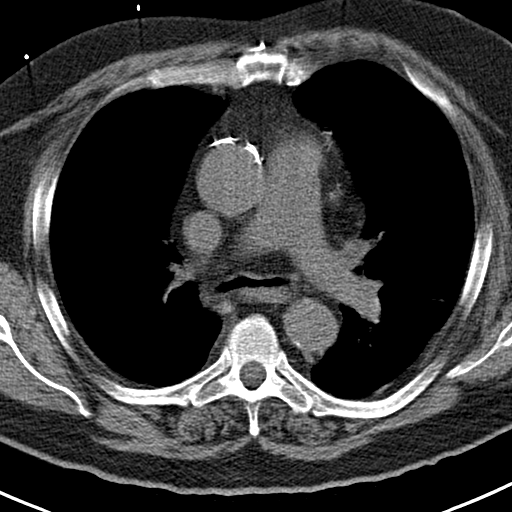
[im 7/11  soft-tissue]
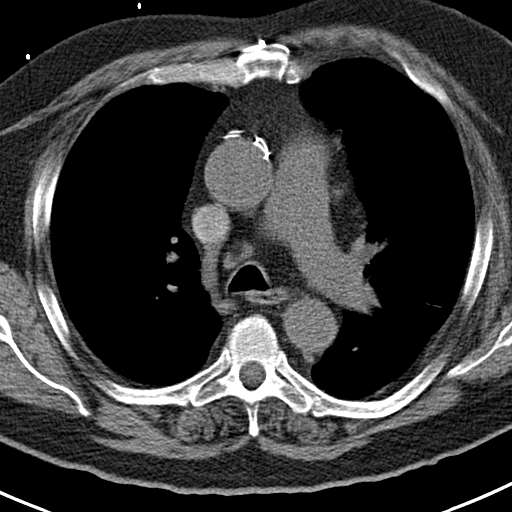
[im 8/11  soft-tissue]
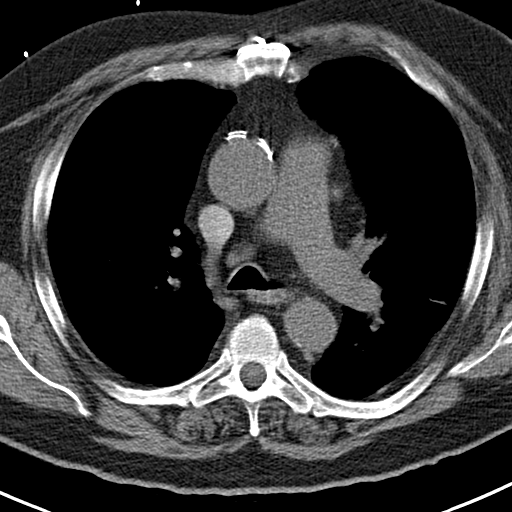
[im 9/11  soft-tissue]
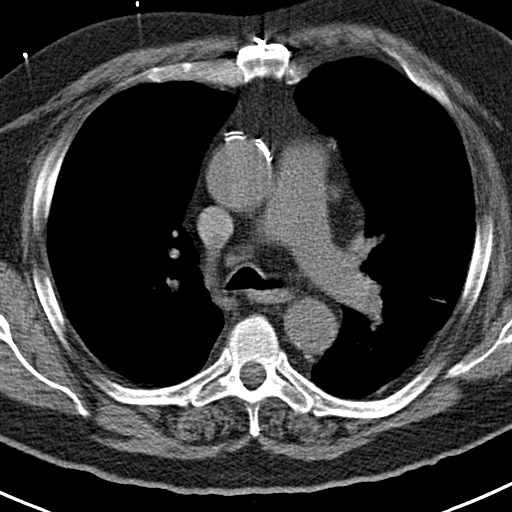
[im 10/11  soft-tissue]
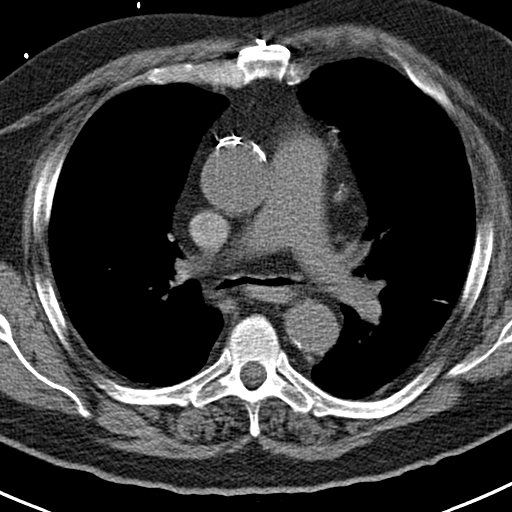
[im 10/11  bone]
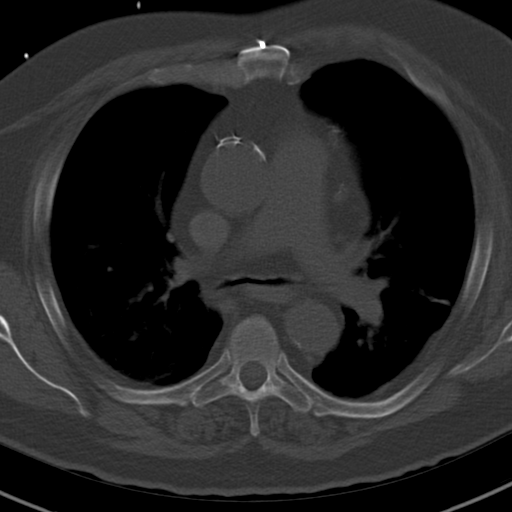

[9 of 12 positions shown; findings below may reference images not displayed]

FINDINGS: Scout topogram demonstrates intact sternotomy wires and mild
cardiomegaly. Axial contrast monitoring images through the great
vessels demonstrate atherosclerotic aneurysmal ascending thoracic
aorta with 4.5 cm diameter.
IMPRESSION: 1. Reported 50 cc Omnipaque 350 left forearm extravasation. Unable
to re-establish peripheral IV access. Patient returned to the
inpatient floor. No diagnostic chest CT angiogram images were
obtained.
2. Mild cardiomegaly visualized on the CT scout topogram.
3. Limited contrast monitoring axial images through the great
vessels demonstrate 4.5 cm aneurysmal ascending thoracic aorta.
Ascending thoracic aortic aneurysm. Recommend semi-annual imaging
followup by CTA or MRA and referral to cardiothoracic surgery if not
already obtained. This recommendation follows 2595
ACCF/AHA/AATS/ACR/ASA/SCA/DRAGOWN/AASHISH/MATEU/TEJAUN Guidelines for the
Diagnosis and Management of Patients With Thoracic Aortic Disease.
Circulation. 2595; 121: E266-e369. Aortic aneurysm NOS
([id number]-F40.6).

## 2020-03-16 IMAGING — US US EXTREM LOW VENOUS
1 series · 13 of 24 positions shown · non-contrast
Comparison: None.

CLINICAL DATA: 78-year-old male with chest pain and leg swelling



[Series 1: us extrem low venous · 13 of 56 slices shown]
[im 1/56]
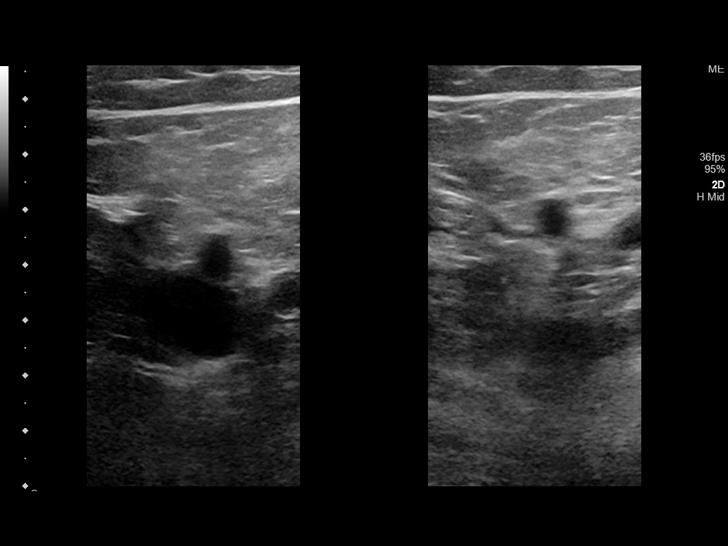
[im 5/56]
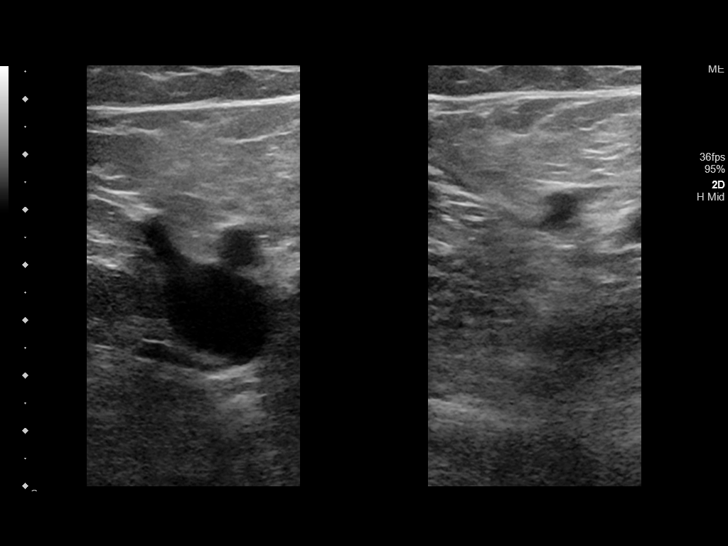
[im 10/56]
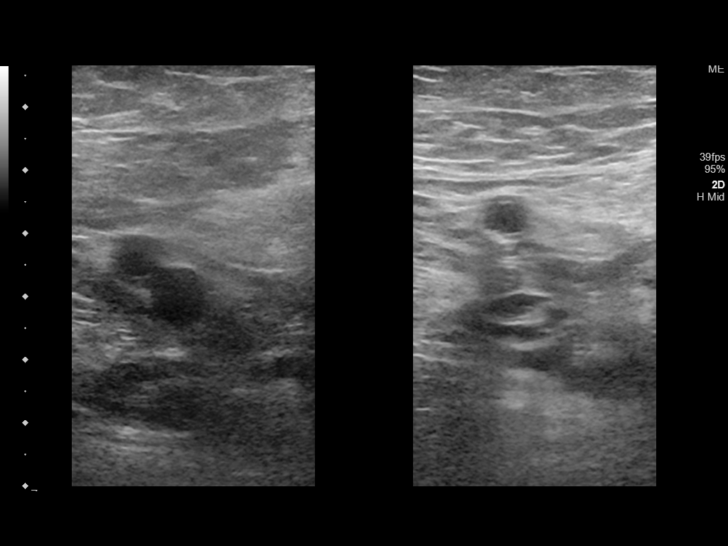
[im 15/56]
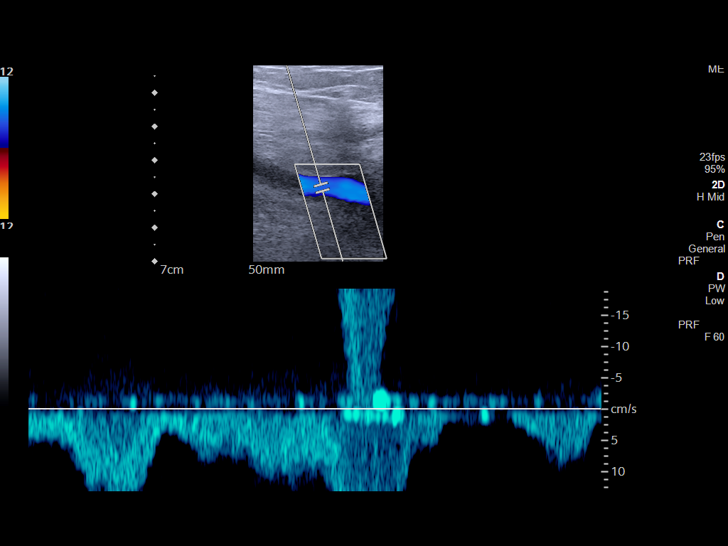
[im 20/56]
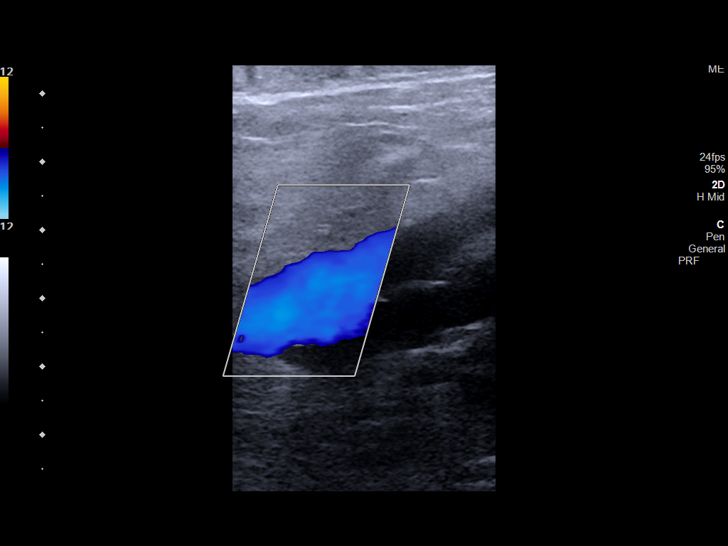
[im 24/56]
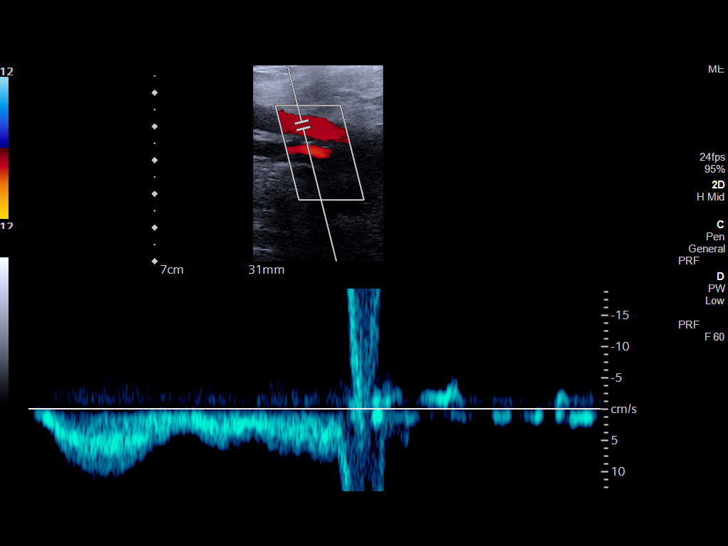
[im 29/56]
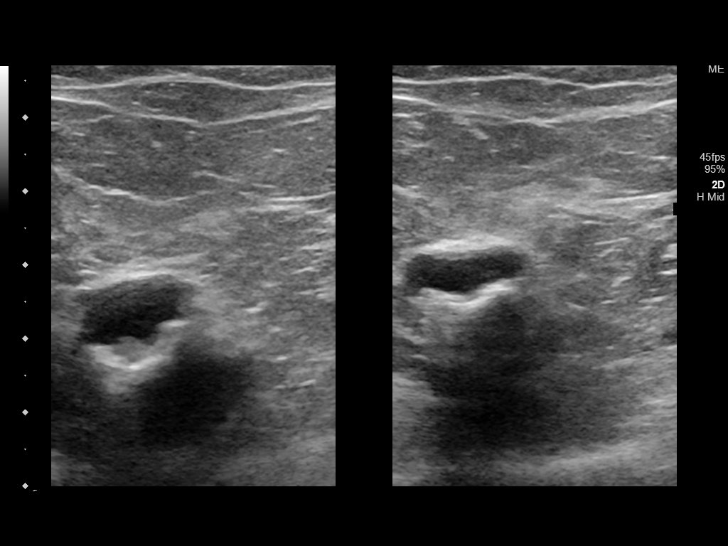
[im 32/56]
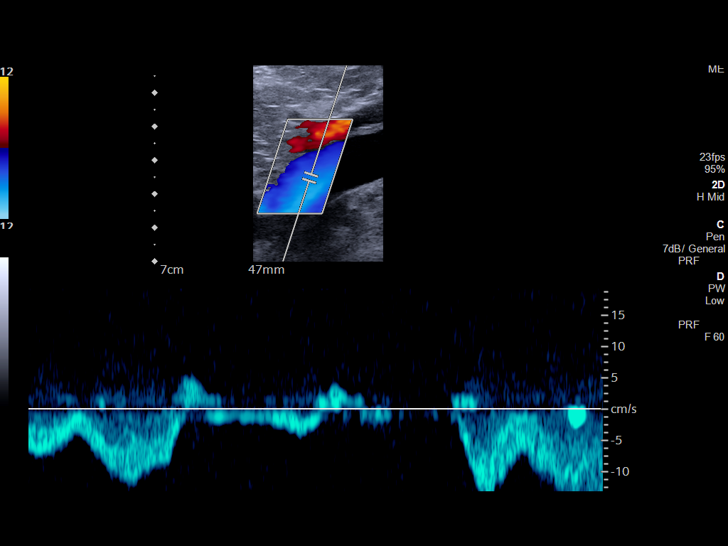
[im 36/56]
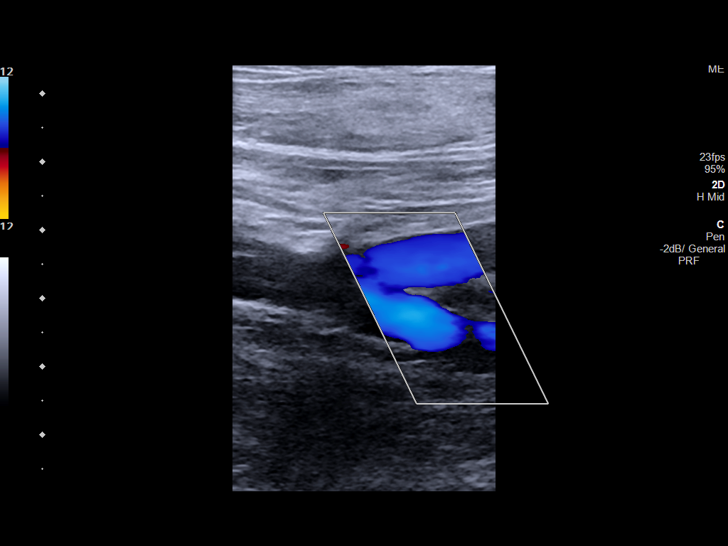
[im 41/56]
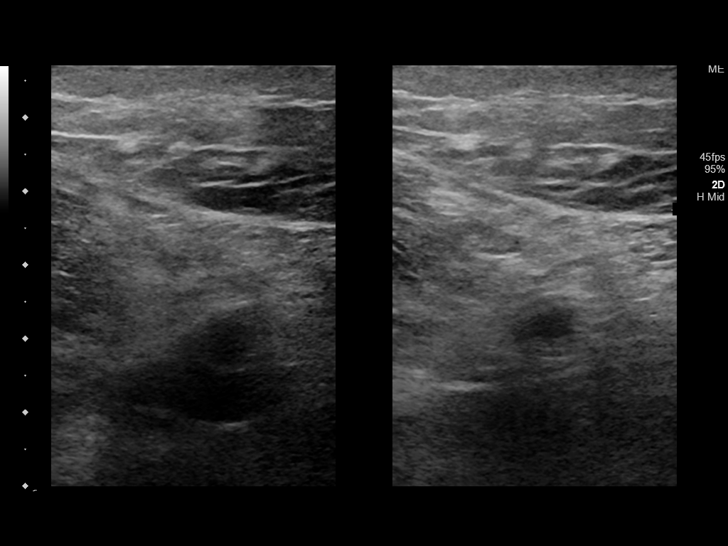
[im 46/56]
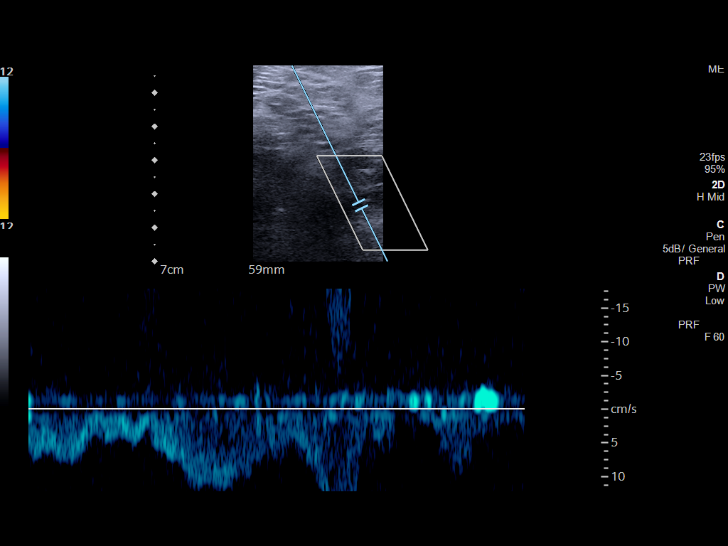
[im 51/56]
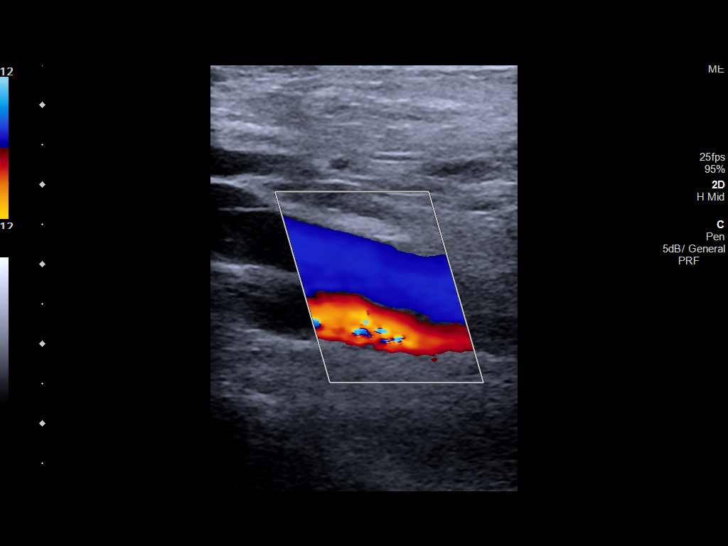
[im 56/56]
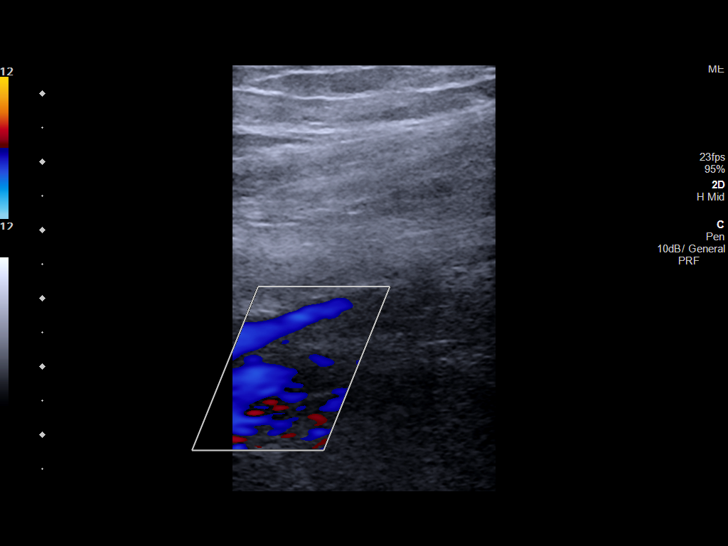

[13 of 24 positions shown; findings below may reference images not displayed]

FINDINGS: RIGHT LOWER EXTREMITY

Common Femoral Vein: No evidence of thrombus. Normal
compressibility, respiratory phasicity and response to augmentation.

Saphenofemoral Junction: No evidence of thrombus. Normal
compressibility and flow on color Doppler imaging.

Profunda Femoral Vein: No evidence of thrombus. Normal
compressibility and flow on color Doppler imaging.

Femoral Vein: No evidence of thrombus. Normal compressibility,
respiratory phasicity and response to augmentation.

Popliteal Vein: No evidence of thrombus. Normal compressibility,
respiratory phasicity and response to augmentation.

Calf Veins: No evidence of thrombus. Normal compressibility and flow
on color Doppler imaging.

Superficial Great Saphenous Vein: No evidence of thrombus. Normal
compressibility and flow on color Doppler imaging.

Other Findings:  None.

LEFT LOWER EXTREMITY

Common Femoral Vein: No evidence of thrombus. Normal
compressibility, respiratory phasicity and response to augmentation.

Saphenofemoral Junction: No evidence of thrombus. Normal
compressibility and flow on color Doppler imaging.

Profunda Femoral Vein: No evidence of thrombus. Normal
compressibility and flow on color Doppler imaging.

Femoral Vein: No evidence of thrombus. Normal compressibility,
respiratory phasicity and response to augmentation.

Popliteal Vein: No evidence of thrombus. Normal compressibility,
respiratory phasicity and response to augmentation.

Calf Veins: No evidence of thrombus. Normal compressibility and flow
on color Doppler imaging.

Superficial Great Saphenous Vein: No evidence of thrombus. Normal
compressibility and flow on color Doppler imaging.

Other Findings:  None.
IMPRESSION: Sonographic survey of the bilateral lower extremities negative for
DVT

## 2020-03-16 NOTE — Evaluation (Signed)
Physical Therapy Evaluation Patient Details Name: [name] MRN: [id number] DOB: [birth year] Today's Date: 03/16/2020   History of Present Illness  79 yo male admitted with Pna. Hx of bipolar d/o, COPD-O2 dep, DM, CAD, CHF, hiatal hernia, fall. Left AMA last hospital visit at [place]  On eval, pt required Min assist for mobility. He walked ~15 feet around the room. Very unsteady and fatigues easily. Dyspnea 2/4. O2 mid 80s% on 6L [place]. Pt declined RW use although he would benefit greatly from using RW for safety + energy conservation, currently. Discussed d/c plan-pt plans to return home. He could benefit from [facility] f/u ([place], [place], [place]), however he may not be agreeable. Will follow and progress activity as tolerated.     Follow Up Recommendations Home health PT;Supervision/Assistance - 24 hour    Equipment Recommendations  None recommended by PT    Recommendations for Other Services OT consult     Precautions / Restrictions Precautions Precautions: Fall Precaution Comments: O2 dep @ baseline Restrictions Weight Bearing Restrictions: No      Mobility  Bed Mobility               General bed mobility comments: sitting EOB at start of session  Transfers     Transfers: Sit to/from Stand Sit to Stand: Min guard         General transfer comment: for safety. increased time. unsteady.  Ambulation/Gait Ambulation/Gait assistance: Min assist Gait Distance (Feet): 15 Feet Assistive device: None("cruising/furniture walk") Gait Pattern/deviations: Step-through pattern;Decreased stride length     General Gait Details: Assist to stabilize throughout distance.Very unsteady/high fall risk without a device. Pt declined RW. O2 mid 80s with walk.  Stairs            Wheelchair Mobility    Modified Rankin (Stroke Patients Only)       Balance Overall balance assessment: Needs assistance         Standing balance support: Bilateral  upper extremity supported Standing balance-Leahy Scale: Poor                               Pertinent Vitals/Pain      Home Living Family/patient expects to be discharged to:: Private residence Living Arrangements: Spouse/significant other Available Help at Discharge: Family Type of Home: House Home Access: Stairs to enter Entrance Stairs-Rails: Psychiatric nurse of Steps: 6 Home Layout: One level Home Equipment: Environmental consultant - 2 wheels;Cane - single point;Bedside commode Additional Comments: Pt reports that he does not amb more than 10-15 ft at a time. Sits on couch all day and does not shower    Prior Function Level of Independence: Needs assistance   Gait / Transfers Assistance Needed: rarely walks more than ~15 ft, On chronic O2, uses RW  ADL's / Homemaking Assistance Needed: Spouse helps with ADL's  Comments: has had home health in the past-not alway pleased with services     Hand Dominance        Extremity/Trunk Assessment   Upper Extremity Assessment Upper Extremity Assessment: Generalized weakness    Lower Extremity Assessment Lower Extremity Assessment: Generalized weakness    Cervical / Trunk Assessment Cervical / Trunk Assessment: Kyphotic  Communication      Cognition Arousal/Alertness: Awake/alert Behavior During Therapy: WFL for tasks assessed/performed Overall Cognitive Status: Within Functional Limits for tasks assessed  General Comments: conversation intermittently inappropriate but easily redirected      General Comments      Exercises     Assessment/Plan    PT Assessment Patient needs continued PT services  PT Problem List Decreased strength;Decreased mobility;Decreased balance;Decreased activity tolerance;Decreased knowledge of use of DME;Obesity       PT Treatment Interventions Gait training;DME instruction;Therapeutic activities;Therapeutic  exercise;Patient/family education;Balance training;Functional mobility training    PT Goals (Current goals can be found in the Care Plan section)  Acute Rehab PT Goals Patient Stated Goal: home. PT Goal Formulation: With patient Time For Goal Achievement: 03/30/20 Potential to Achieve Goals: Fair    Frequency Min 3X/week   Barriers to discharge        Co-evaluation               AM-PAC PT "6 Clicks" Mobility  Outcome Measure Help needed turning from your back to your side while in a flat bed without using bedrails?: A Little Help needed moving from lying on your back to sitting on the side of a flat bed without using bedrails?: A Little Help needed moving to and from a bed to a chair (including a wheelchair)?: A Little Help needed standing up from a chair using your arms (e.g., wheelchair or bedside chair)?: A Little Help needed to walk in hospital room?: A Little Help needed climbing 3-5 steps with a railing? : A Little 6 Click Score: 18    End of Session Equipment Utilized During Treatment: Gait belt Activity Tolerance: Patient tolerated treatment well Patient left: in bed;with call bell/phone within reach;with bed alarm set   PT Visit Diagnosis: Muscle weakness (generalized) (M62.81);Difficulty in walking, not elsewhere classified (R26.2)    Time: [id number] PT Time Calculation (min) (ACUTE ONLY): 46 min   Charges:   PT Evaluation $PT Eval Low Complexity: 1 Low PT Treatments $Gait Training: 8-22 mins           Doreatha Massed, PT Acute Rehabilitation

## 2020-03-16 NOTE — Progress Notes (Signed)
PROGRESS NOTE  [name] [id number] DOB: 1941/02/17 DOA: 03/14/2020 PCP: [name], MD   LOS: 1 day   Brief narrative: As per HPI and previous provider,  80 year old man presented with shortness of breath, productive cough and worsening fatigue, orthopnea, lower extremity edema.  Admitted for lobar pneumonia, acute on chronic diastolic CHF.  Assessment/Plan:  Principal Problem:   Lobar pneumonia (HCC) Active Problems:   Hyperlipidemia   Essential hypertension   COPD (chronic obstructive pulmonary disease) (HCC)   CAD (coronary artery disease)   Chronic hypoxemic respiratory failure (HCC)   Insomnia   Iron deficiency anemia due to chronic blood loss   AKI (acute kidney injury) ([place])   Type 2 diabetes mellitus (HCC)   Acute on chronic diastolic CHF (congestive heart failure) (HCC)   Acute on chronic respiratory failure with hypoxia (HCC)   Anxiety with depression   Hyperkalemia   Left knee pain  Acute lobar pneumonia, left lower lobe with shortness of breath, complicated by COPD, chronic hypoxic respiratory failure on 6 L nasal cannula at home. On IV antibiotic.  We will continue with that.  We will continue bronchodilators supplemental oxygen.    Acute on chronic diastolic CHF CPAP Lasix.  Has been chest oral.  We will continue to monitor closely.  Intake and output charting, daily weights  AKI with modest hyperkalemia, superimposed on CKD stage IIIa ACE inhibitor on hold.  Creatinine of 1.2 today.  Continue to monitor.  Chronic fatigue, essentially bedbound status reportedly secondary to left knee pain --PT evaluation-ending.  Symptomatic iron deficiency anemia secondary to chronic blood loss --Status post 1 unit PRBC with appropriate response in hemoglobin.  Recheck CBC in a.m. hemoglobin of 9.4.  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  No active bleeding at this time.  Chronic left knee pain since October 2020 --MRI October 2020 left knee showed inferior articular surface tearing involving posterior horn, possible medial tibial bone contusion, stress reaction or subtle subchondral stress fracture, moderate to large joint effusion.  xray of the knee without any acute findings or effusion.  Examination of the knee is benign.  Continue gabapentin.  Physical therapy pending.  Orthopedic evaluation scheduled for 05/31.  Diabetes mellitus type 2 On Lantus twice a day and sliding scale insulin while in the hospital.  Reportedly not using Lantus at home.  Hemoglobin A1c of 6.7.  Bipolar 1 disorder --appears stable.  Aortic atherosclerosis --Continue statin.  VTE Prophylaxis: Sequential compression device  Code Status: Full code  Family Communication: None  Status is: Inpatient  Remains inpatient appropriate because:Inpatient level of care appropriate due to severity of illness and Ambulatory dysfunction,   Dispo: The patient is from: Home              Anticipated d/c is to: Home with home health/skilled nursing facility.  Pending physical therapy evaluation.              Anticipated d/c date is: 2 days              Patient currently is not medically stable to d/c.   Consultants:  None  Procedures:  5/29: 1 unit PRBC transfusion  Antibiotics:   Azithromycin 5/29>  Ceftriaxone 5/29->   Anti-infectives (From admission, onward)   Start     Dose/Rate Route Frequency Ordered Stop   03/15/20 2000  azithromycin (ZITHROMAX) tablet 500  mg     500 mg Oral Daily 03/15/20 0314 03/19/20 0959   03/15/20 0600  cefTRIAXone (ROCEPHIN) 2 g in sodium chloride 0.9 % 100 mL IVPB     2 g 200 mL/hr over 30 Minutes Intravenous Every 24 hours 03/15/20 0314 03/20/20 0559   03/14/20 2015  ceFEPIme (MAXIPIME) 2 g in sodium chloride 0.9 % 100 mL IVPB     2 g 200 mL/hr over 30 Minutes Intravenous   Once 03/14/20 2011 03/14/20 2152   03/14/20 2015  azithromycin (ZITHROMAX) 500 mg in sodium chloride 0.9 % 250 mL IVPB     500 mg 250 mL/hr over 60 Minutes Intravenous  Once 03/14/20 2011 03/15/20 0016       Subjective: Today, patient was seen and examined at bedside.  Patient complains of left knee pain with difficulty ambulating secondary to that.  He states that he has had MRIs and other scans which were negative.  Orthopedic evaluation scheduled for 531.  Objective: Vitals:   03/15/20 2055 03/16/20 0630  BP: (!) 144/85 (!) 148/84  Pulse: 82 92  Resp: 20 20  Temp: 99.1 F (37.3 C) 98.1 F (36.7 C)  SpO2: 97% 96%    Intake/Output Summary (Last 24 hours) at 03/16/2020 1155 Last data filed at 03/16/2020 0948 Gross per 24 hour  Intake 1320 ml  Output 1750 ml  Net -430 ml   Filed Weights   03/14/20 1920 03/15/20 0500 03/16/20 0500  Weight: 127.9 kg 123.5 kg 126.3 kg   Body mass index is 34.8 kg/m.   Physical Exam: GENERAL: Patient is alert awake and oriented. Not in obvious distress.  Obese HENT: No scleral pallor or icterus. Pupils equally reactive to light. Oral mucosa is moist NECK: is supple, no gross swelling noted. CHEST: Diminished breath sounds bilateral.   CVS: S1 and S2 heard, no murmur. Regular rate and rhythm.  ABDOMEN: Soft, non-tender, bowel sounds are present. EXTREMITIES: No edema.  Left knee without erythema induration or effusion. CNS: Cranial nerves are intact. No focal motor deficits. SKIN: warm and dry without rashes.  Data Review: I have personally reviewed the following laboratory data and studies,  CBC: Recent Labs  Lab 03/14/20 1856 03/15/20 0423 03/16/20 0306  WBC 7.0 8.0 11.2*  NEUTROABS 5.1  --   --   HGB 8.3* 9.7* 9.4*  HCT 29.0* 33.1* 32.5*  MCV 89.0 86.6 88.3  PLT 240 168 [id number]   Basic Metabolic Panel: Recent Labs  Lab 03/14/20 1856 03/14/20 1928 03/15/20 0423 03/16/20 0306  NA 138  --  134* 133*  K 4.5  --  5.4* 4.2  CL  89*  --  87* 87*  CO2 37*  --  34* 31  GLUCOSE 127*  --  360* 282*  BUN 18  --  24* 33*  CREATININE 1.08  --  1.47* 1.29*  CALCIUM 8.6*  --  8.8* 8.5*  MG  --  1.9  --   --   PHOS  --  2.9  --   --    Liver Function Tests: Recent Labs  Lab 03/15/20 0423  AST 30  ALT 9  ALKPHOS 105  BILITOT 0.7  PROT 7.3  ALBUMIN 3.6   No results for input(s): LIPASE, AMYLASE in the last 168 hours. No results for input(s): AMMONIA in the last 168 hours. Cardiac Enzymes: No results for input(s): CKTOTAL, CKMB, CKMBINDEX, TROPONINI in the last 168 hours. BNP (last 3 results) Recent Labs    09/13/19 0146  02/25/20 0336 03/14/20 1857  BNP 188.0* 158.2* 255.8*    ProBNP (last 3 results) No results for input(s): PROBNP in the last 8760 hours.  CBG: Recent Labs  Lab 03/15/20 1207 03/15/20 1652 03/15/20 2057 03/16/20 0745 03/16/20 1131  GLUCAP 301* 208* 228* 220* 196*   Recent Results (from the past 240 hour(s))  SARS Coronavirus 2 by RT PCR (hospital order, performed in [facility] hospital lab) Nasopharyngeal Nasopharyngeal Swab     Status: None   Collection Time: 03/14/20  8:10 PM   Specimen: Nasopharyngeal Swab  Result Value Ref Range Status   SARS Coronavirus 2 NEGATIVE NEGATIVE Final    Comment: (NOTE) SARS-CoV-2 target nucleic acids are NOT DETECTED. The SARS-CoV-2 RNA is generally detectable in upper and lower respiratory specimens during the acute phase of infection. The lowest concentration of SARS-CoV-2 viral copies this assay can detect is 250 copies / mL. A negative result does not preclude SARS-CoV-2 infection and should not be used as the sole basis for treatment or other patient management decisions.  A negative result may occur with improper specimen collection / handling, submission of specimen other than nasopharyngeal swab, presence of viral mutation(s) within the areas targeted by this assay, and inadequate number of viral copies (<250 copies / mL). A negative  result must be combined with clinical observations, patient history, and epidemiological information. Fact Sheet for Patients:   StrictlyIdeas.no Fact Sheet for Healthcare Providers: BankingDealers.co.za This test is not yet approved or cleared  by the [place] FDA and has been authorized for detection and/or diagnosis of SARS-CoV-2 by FDA under an Emergency Use Authorization (EUA).  This EUA will remain in effect (meaning this test can be used) for the duration of the COVID-19 declaration under Section 564(b)(1) of the Act, 21 U.S.C. section 360bbb-3(b)(1), unless the authorization is terminated or revoked sooner. Performed at [facility], [place] [address], [place],  [address]      Studies: DG Chest Port 1 View  Result Date: 03/14/2020 CLINICAL DATA:  Shortness of breath and weakness EXAM: PORTABLE CHEST 1 VIEW COMPARISON:  Feb 25, 2020 FINDINGS: There is ill-defined opacity in the lateral left base concerning for a focus of pneumonia, potentially with small associated pleural effusion. There is mild bibasilar atelectasis. There is also slight atelectasis in the left upper lobe. Heart is upper normal in size with pulmonary vascularity. Patient is status post coronary artery bypass grafting. There is a large hiatal type hernia. There is aortic atherosclerosis. No bone lesions. IMPRESSION: Suspect pneumonia with small pleural effusion left base. Scattered areas of atelectatic change in the bases and left mid lung. Stable cardiac silhouette. Large hiatal type hernia. Status post coronary artery bypass grafting. Aortic Atherosclerosis (ICD10-I70.0). Electronically Signed   By: Lowella Grip III M.D.   On: 03/14/2020 19:17   DG Knee Complete 4 Views Left  Result Date: 03/15/2020 CLINICAL DATA:  Continued chronic left knee pain. EXAM: LEFT KNEE - COMPLETE 4+ VIEW COMPARISON:  None. FINDINGS: Vascular calcifications.  No fracture or effusion. No significant degenerative changes. No bony lesions. IMPRESSION: No acute abnormalities.  No cause for pain identified. Electronically Signed   By: Dorise Bullion III M.D   On: 03/15/2020 12:23   ECHOCARDIOGRAM COMPLETE  Result Date: 03/15/2020    ECHOCARDIOGRAM REPORT   Patient Name:   [name] Date of Exam: 03/15/2020 Medical Rec #:  [id number]        Height:       75.0 in  Accession #:    [id number]       Weight:       272.3 lb Date of Birth:  28-Aug-1941        BSA:          2.502 m Patient Age:    29 years         BP:           154/82 mmHg Patient Gender: M                HR:           98 bpm. Exam Location:  Inpatient Procedure: 2D Echo Indications:    CHF-Acute Diastolic [id number] / [id number]  History:        Patient has prior history of Echocardiogram examinations, most                 recent 05/06/2019. CAD, COPD; Risk Factors:Hypertension, Diabetes                 and Dyslipidemia. AAA                 AKI.  Sonographer:    Vikki Ports Turrentine Referring Phys: [id number] [place]  1. Normal LV systolic function; mild LVH; grade 1 diastolic dysfunction; mild AS with mean gradient 14 mmHg.  2. Left ventricular ejection fraction, by estimation, is 55 to 60%. The left ventricle has normal function. The left ventricle has no regional wall motion abnormalities. There is mild left ventricular hypertrophy. Left ventricular diastolic parameters are consistent with Grade I diastolic dysfunction (impaired relaxation).  3. Right ventricular systolic function is normal. The right ventricular size is normal. There is normal pulmonary artery systolic pressure.  4. The mitral valve is normal in structure. Trivial mitral valve regurgitation. No evidence of mitral stenosis.  5. The aortic valve is tricuspid. Aortic valve regurgitation is not visualized. Mild aortic valve stenosis.  6. The inferior vena cava is dilated in size with >50% respiratory variability, suggesting right  atrial pressure of 8 mmHg. FINDINGS  Left Ventricle: Left ventricular ejection fraction, by estimation, is 55 to 60%. The left ventricle has normal function. The left ventricle has no regional wall motion abnormalities. The left ventricular internal cavity size was normal in size. There is  mild left ventricular hypertrophy. Left ventricular diastolic parameters are consistent with Grade I diastolic dysfunction (impaired relaxation). Right Ventricle: The right ventricular size is normal. Right ventricular systolic function is normal. There is normal pulmonary artery systolic pressure. The tricuspid regurgitant velocity is 2.26 m/s, and with an assumed right atrial pressure of 8 mmHg,  the estimated right ventricular systolic pressure is [id number] mmHg. Left Atrium: Left atrial size was normal in size. Right Atrium: Right atrial size was normal in size. Pericardium: There is no evidence of pericardial effusion. Mitral Valve: The mitral valve is normal in structure. Normal mobility of the mitral valve leaflets. Trivial mitral valve regurgitation. No evidence of mitral valve stenosis. Tricuspid Valve: The tricuspid valve is normal in structure. Tricuspid valve regurgitation is trivial. No evidence of tricuspid stenosis. Aortic Valve: The aortic valve is tricuspid. Aortic valve regurgitation is not visualized. Mild aortic stenosis is present. Aortic valve mean gradient measures 12.2 mmHg. Aortic valve peak gradient measures 19.2 mmHg. Aortic valve area, by VTI measures 2.72 cm. Pulmonic Valve: The pulmonic valve was not well visualized. Pulmonic valve regurgitation is not visualized. No evidence of pulmonic stenosis. Aorta: The aortic root is normal in size and  structure. Venous: The inferior vena cava is dilated in size with greater than 50% respiratory variability, suggesting right atrial pressure of 8 mmHg.  Additional Comments: Normal LV systolic function; mild LVH; grade 1 diastolic dysfunction; mild AS with mean  gradient 14 mmHg.  LEFT VENTRICLE PLAX 2D LVIDd:         4.80 cm  Diastology LVIDs:         3.70 cm  LV e' lateral:   13.60 cm/s LV PW:         1.10 cm  LV E/e' lateral: 7.6 LV IVS:        1.20 cm LVOT diam:     2.30 cm LV SV:         118 LV SV Index:   47 LVOT Area:     4.15 cm  RIGHT VENTRICLE TAPSE (M-mode): 1.9 cm LEFT ATRIUM             Index       RIGHT ATRIUM           Index LA diam:        3.10 cm 1.24 cm/m  RA Area:     22.80 cm LA Vol (A2C):   55.7 ml 22.26 ml/m RA Volume:   69.40 ml  27.74 ml/m LA Vol (A4C):   62.7 ml 25.06 ml/m LA Biplane Vol: 60.2 ml 24.06 ml/m  AORTIC VALVE AV Area (Vmax):    2.50 cm AV Area (Vmean):   2.61 cm AV Area (VTI):     2.72 cm AV Vmax:           219.25 cm/s AV Vmean:          163.750 cm/s AV VTI:            0.433 m AV Peak Grad:      19.2 mmHg AV Mean Grad:      12.2 mmHg LVOT Vmax:         132.00 cm/s LVOT Vmean:        103.000 cm/s LVOT VTI:          0.283 m LVOT/AV VTI ratio: 0.65  AORTA Ao Root diam: 3.60 cm MITRAL VALVE                TRICUSPID VALVE MV Area (PHT): 5.84 cm     TR Peak grad:   20.4 mmHg MV Decel Time: 130 msec     TR Vmax:        226.00 cm/s MV E velocity: 104.00 cm/s MV A velocity: 136.00 cm/s  SHUNTS MV E/A ratio:  0.76         Systemic VTI:  0.28 m                             Systemic Diam: 2.30 cm Kirk Ruths MD Electronically signed by Kirk Ruths MD Signature Date/Time: 03/15/2020/1:29:59 PM    Final       Flora Lipps, MD  [facility] 03/16/2020

## 2020-03-16 NOTE — Progress Notes (Signed)
[place] used to attempt insertion of IV in left upper arm but pt complained of nerve pain so I withdrew the catheter and placed a gauze dressing on the site. Second IV was placed in the left hand. The patient told me that if the first IV hurt him like the first that I would be the next one in a hospital bed because he was a Mudlogger and that he would kill me if I ever did that again. I explained to the patient that I did not hurt him deliberately and to refrain from making threats. He cussed at me and said it was not a threat but a promise. I advised his nurse to be careful around him because he is threatening violence.

## 2020-03-17 DIAGNOSIS — E875 Hyperkalemia: Secondary | ICD-10-CM

## 2020-03-17 DIAGNOSIS — F418 Other specified anxiety disorders: Secondary | ICD-10-CM

## 2020-03-17 DIAGNOSIS — E785 Hyperlipidemia, unspecified: Secondary | ICD-10-CM

## 2020-03-17 LAB — GLUCOSE, CAPILLARY
Glucose-Capillary: 148 mg/dL — ABNORMAL HIGH (ref 70–99)
Glucose-Capillary: 152 mg/dL — ABNORMAL HIGH (ref 70–99)

## 2020-03-17 LAB — BASIC METABOLIC PANEL
Anion gap: 10 (ref 5–15)
BUN: 33 mg/dL — ABNORMAL HIGH (ref 8–23)
CO2: 35 mmol/L — ABNORMAL HIGH (ref 22–32)
Calcium: 8.4 mg/dL — ABNORMAL LOW (ref 8.9–10.3)
Chloride: 89 mmol/L — ABNORMAL LOW (ref 98–111)
Creatinine, Ser: 1.24 mg/dL (ref 0.61–1.24)
GFR calc Af Amer: >60 mL/min (ref >60)
GFR calc non Af Amer: 55 mL/min — ABNORMAL LOW (ref >60)
Glucose, Bld: 157 mg/dL — ABNORMAL HIGH (ref 70–99)
Potassium: 3.7 mmol/L (ref 3.5–5.1)
Sodium: 134 mmol/L — ABNORMAL LOW (ref 135–145)

## 2020-03-17 LAB — CBC
HCT: 31.1 % — ABNORMAL LOW (ref 39.0–52.0)
Hemoglobin: 9 g/dL — ABNORMAL LOW (ref 13.0–17.0)
MCH: 25.6 pg — ABNORMAL LOW (ref 26.0–34.0)
MCHC: 28.9 g/dL — ABNORMAL LOW (ref 30.0–36.0)
MCV: 88.6 fL (ref 80.0–100.0)
Platelets: 214 10*3/uL (ref 150–400)
RBC: 3.51 MIL/uL — ABNORMAL LOW (ref 4.22–5.81)
RDW: 16.3 % — ABNORMAL HIGH (ref 11.5–15.5)
WBC: 8.5 10*3/uL (ref 4.0–10.5)
nRBC: 0 % (ref 0.0–0.2)

## 2020-03-17 LAB — MAGNESIUM: Magnesium: 2 mg/dL (ref 1.7–2.4)

## 2020-03-17 IMAGING — DX DG KNEE COMPLETE 4+V*L*
4 series · 4 of 4 positions shown · non-contrast
Comparison: Left knee MRI 08/06/2019 and radiographs 07/30/2019

CLINICAL DATA: Left knee pain.

EXAM:
LEFT KNEE - COMPLETE 4+ VIEW

[knee ap]
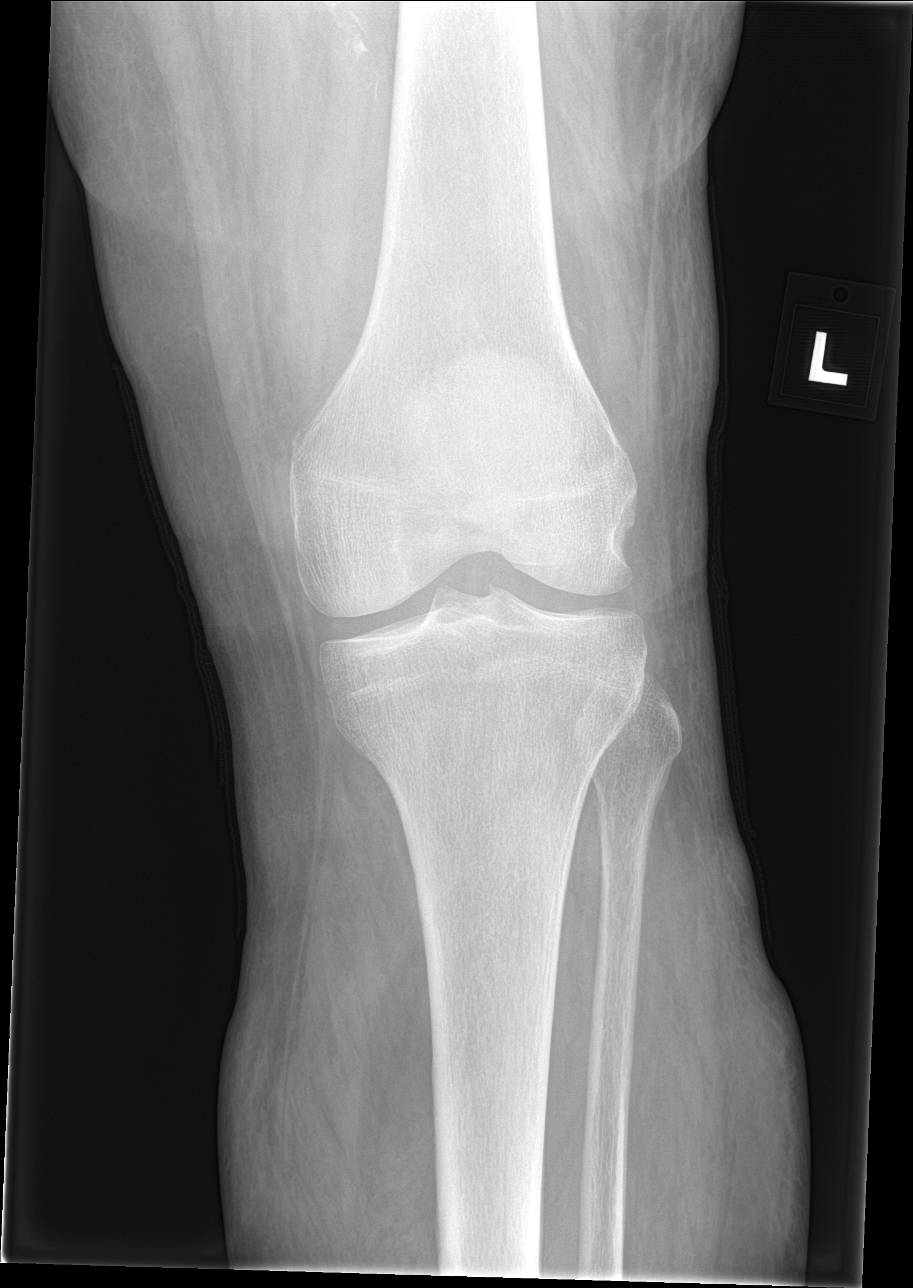

[knee tunnel]
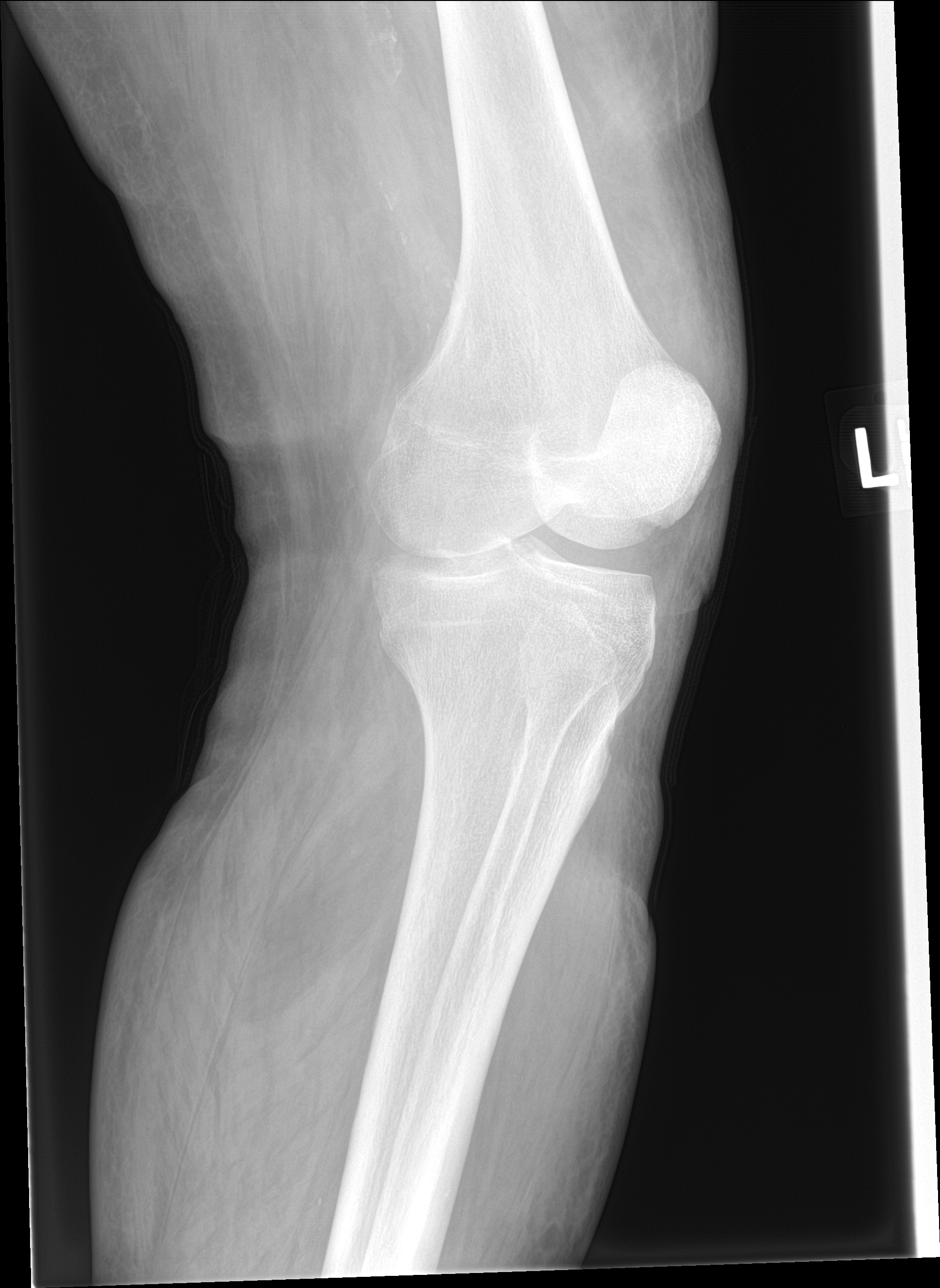

[knee lat]
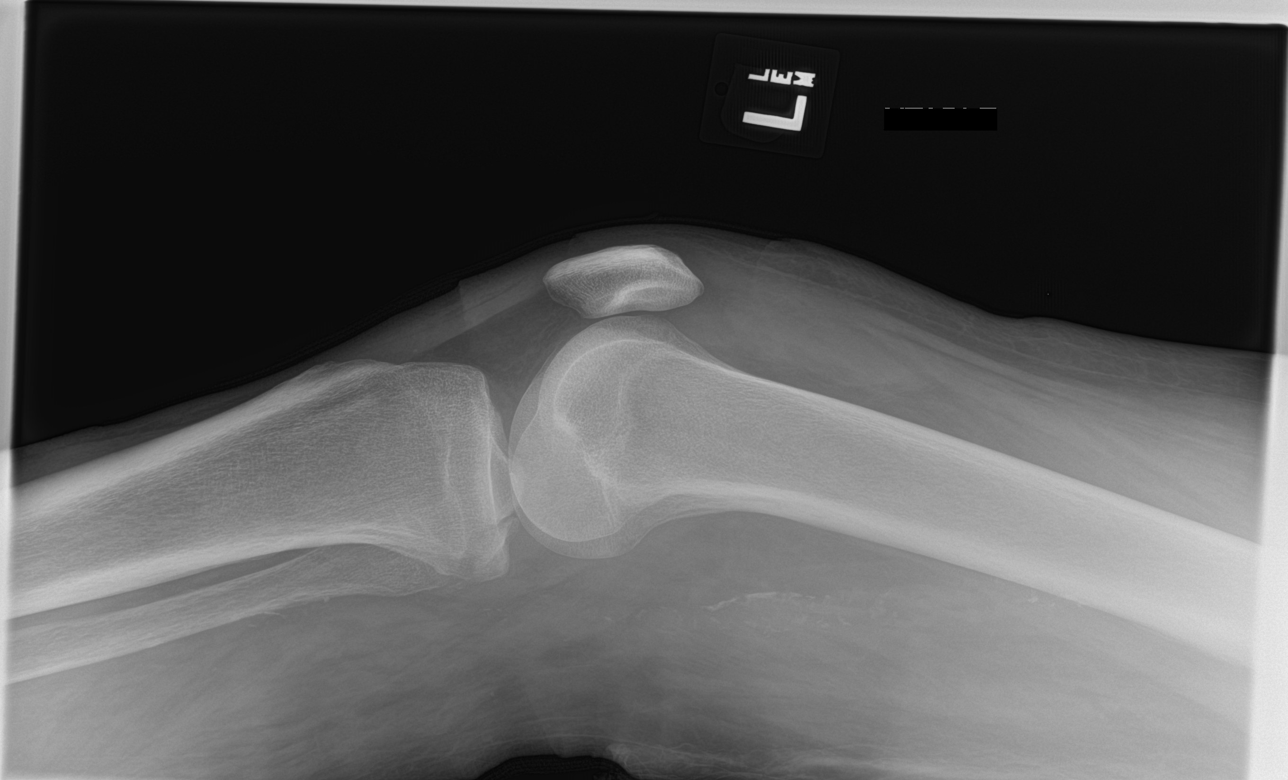

[knee obl]
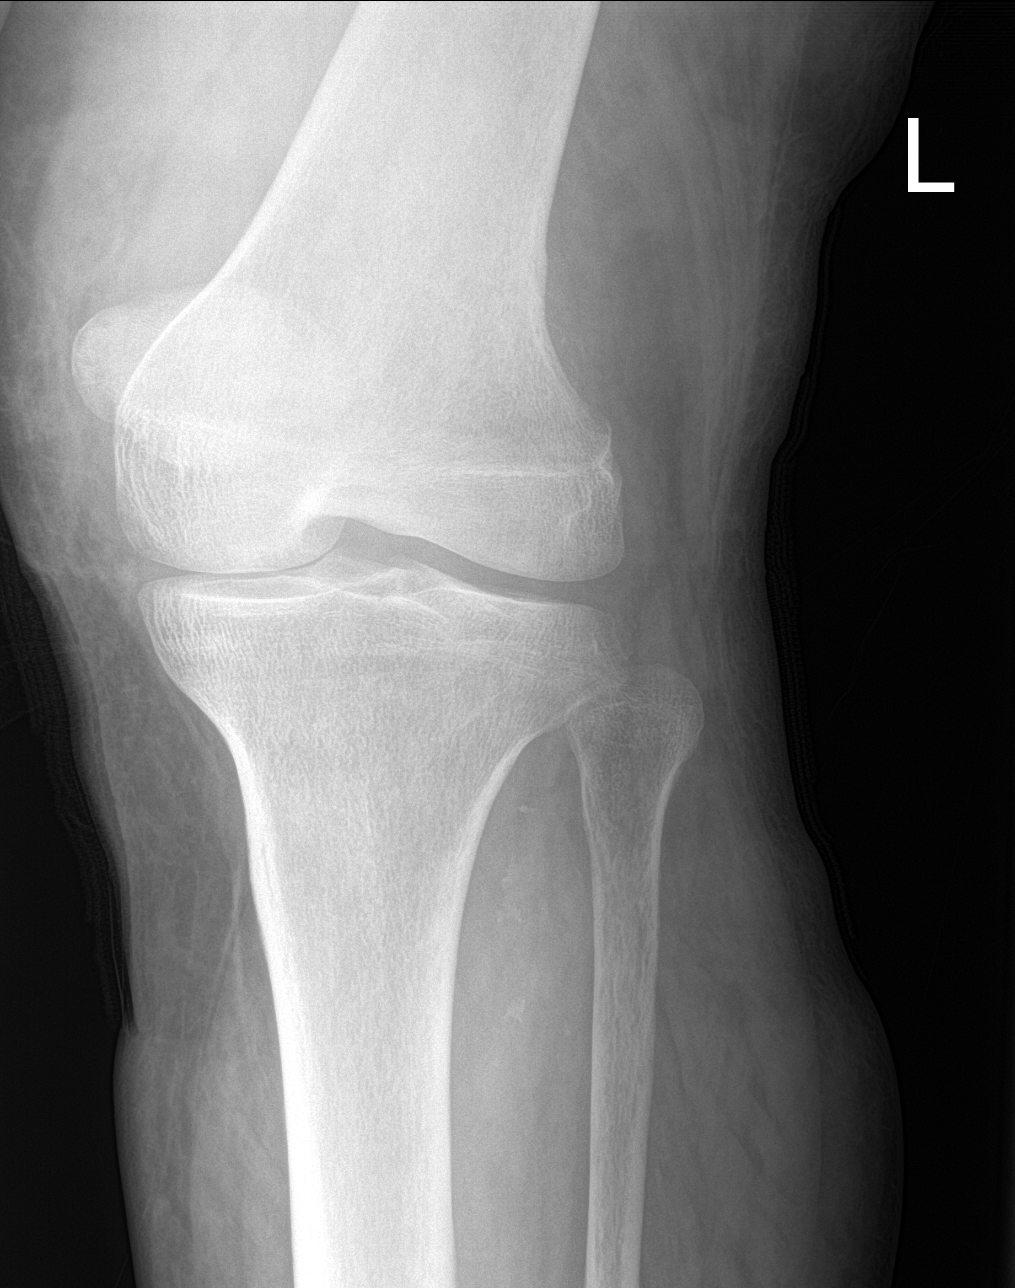

[4 of 4 positions shown; findings below may reference images not displayed]

FINDINGS: A small to moderate-sized knee joint effusion is evident, not
apparent on the prior radiographs although a joint effusion was seen
on the interval MRI. No acute fracture or dislocation is identified.
Joint space widths are preserved. There is mild diffuse subcutaneous
edema.
IMPRESSION: Knee joint effusion without acute osseous abnormality identified.

## 2020-03-17 MED ORDER — SENNA 8.6 MG PO TABS: 1.0000 | ORAL_TABLET | Freq: Two times a day (BID) | ORAL | 0 refills | Status: AC

## 2020-03-17 MED ORDER — DOXYCYCLINE HYCLATE 100 MG PO TABS: 100.0000 mg | ORAL_TABLET | Freq: Two times a day (BID) | ORAL | 0 refills | Status: AC

## 2020-03-17 NOTE — Discharge Instructions (Signed)

## 2020-03-17 NOTE — TOC Progression Note (Signed)
Transition of Care [facility]) - Progression Note    Patient Details  Name: [name] MRN: [id number] Date of Birth: 1941-02-09  [facility] CM/SW Contact  Purcell Mouton, RN Phone Number: 03/17/2020, 12:04 PM  Clinical Narrative:    Pt declined [place] at present time. Pt states that he will do for himself. He continued that HHPT will have him doing things that he can not do, like raising his leg up and down. Pt continued that [facility] can not stay a long time and visits are short time.    Expected Discharge Plan: Home/Self Care Barriers to Discharge: No Barriers Identified  Expected Discharge Plan and Services Expected Discharge Plan: Home/Self Care   Discharge Planning Services: CM Consult   Living arrangements for the past 2 months: Single Family Home Expected Discharge Date: 03/17/20                         HH Arranged: Refused HH           Social Determinants of Health (SDOH) Interventions    Readmission Risk Interventions Readmission Risk Prevention Plan 09/29/2019 08/15/2019 07/08/2019  Transportation Screening Complete Complete Complete  Medication Review [employer]) - Referral to Pharmacy Complete  PCP or Specialist appointment within 3-5 days of discharge Complete Complete Complete  HRI or Home Care Consult Complete Complete Complete  SW Recovery Care/Counseling Consult Complete - Complete  Palliative Care Screening Not Applicable Complete Patient [place] Not Applicable Complete Not Applicable  Some recent data might be hidden

## 2020-03-17 NOTE — Progress Notes (Signed)
PT Cancellation Note  Patient Details Name: [name] MRN: [id number] DOB: 1941/05/10   Cancelled Treatment:    Reason Eval/Treat Not Completed: Fatigue/lethargy limiting ability to participate Pt worked with OT this morning and just used BSC.  Pt reports d/c home today and would like to conserve his energy for d/c home.  Pt declines to participate at this time.   Alaine Loughney,KATHrine E 03/17/2020, 11:47 AM Arlyce Dice, DPT Acute Rehabilitation Services Office: [phone]

## 2020-03-17 NOTE — Progress Notes (Signed)
Nsg Discharge Note  Admit Date:  03/14/2020 Discharge date: 03/17/2020   [name] to be D/C'd home with [facility] per MD order.  AVS completed. Patient/caregiver able to verbalize understanding.  Discharge Medication: Allergies as of 03/17/2020      Reactions   Morphine And Related Shortness Of Breath, Nausea And Vomiting, Rash, Other (See Comments)   Reaction:  Hallucinations    Penicillins Anaphylaxis, Hives, Other (See Comments)   10/02/18 - discussed with patient and he states he can tolerate penicillin capsules and had some hives when he was 79yo and received pcn injections Has patient had a PCN reaction causing immediate rash, facial/tongue/throat swelling, SOB or lightheadedness with hypotension: Yes Has patient had a PCN reaction causing severe rash involving mucus membranes or skin necrosis: No Has patient had a PCN reaction that required hospitalization No Has patient had a PCN reaction occurring within the last 10 y   Zolpidem Shortness Of Breath   Demerol [meperidine] Other (See Comments)   Reaction:  Hallucinations     Dilaudid [hydromorphone Hcl] Other (See Comments)   Reaction:  Hallucinations    Levofloxacin Other (See Comments)   Reaction:  Unknown       Medication List    TAKE these medications   acetaminophen 325 MG tablet Commonly known as: TYLENOL Take 650 mg by mouth every 6 (six) hours as needed for mild pain.   albuterol 108 (90 Base) MCG/ACT inhaler Commonly known as: VENTOLIN HFA Inhale 1-2 puffs into the lungs every 6 (six) hours as needed for wheezing or shortness of breath.   ALPRAZolam 0.25 MG tablet Commonly known as: XANAX Take 0.25 mg by mouth daily as needed for anxiety.   atorvastatin 40 MG tablet Commonly known as: LIPITOR Take 1 tablet (40 mg total) by mouth daily at 6 PM.   benzonatate 100 MG capsule Commonly known as: TESSALON Take 100 mg by mouth every 8 (eight) hours as needed for cough or congestion.   citalopram 40 MG  tablet Commonly known as: CELEXA Take 0.5 tablets (20 mg total) by mouth daily. What changed: how much to take   [employer] Melatonin 5 MG Tabs Generic drug: melatonin Take 5 mg by mouth at bedtime. For sleep   diclofenac Sodium 1 % Gel Commonly known as: Voltaren Apply 4 g topically 2 (two) times daily as needed (for bilateral knee pain).   doxycycline 100 MG tablet Commonly known as: VIBRA-TABS Take 1 tablet (100 mg total) by mouth 2 (two) times daily for 5 days.   fluticasone 50 MCG/ACT nasal spray Commonly known as: FLONASE Place 2 sprays into both nostrils daily. What changed:   when to take this  reasons to take this   furosemide 20 MG tablet Commonly known as: LASIX Take 20 mg by mouth 2 (two) times daily.   gabapentin 800 MG tablet Commonly known as: NEURONTIN Take 400 mg by mouth 4 (four) times daily.   insulin aspart 100 UNIT/ML injection Commonly known as: novoLOG Inject 25 Units into the skin 3 (three) times daily with meals.   insulin glargine 100 UNIT/ML injection Commonly known as: LANTUS Inject 0.35 mLs (35 Units total) into the skin 2 (two) times daily.   Lantus SoloStar 100 UNIT/ML Solostar Pen Generic drug: insulin glargine Inject 35 Units into the skin 2 (two) times daily.   lisinopril 2.5 MG tablet Commonly known as: ZESTRIL Take 1 tablet (2.5 mg total) by mouth daily.   pantoprazole 40 MG tablet Commonly known as: PROTONIX  Take 1 tablet (40 mg total) by mouth 2 (two) times daily.   propranolol 20 MG tablet Commonly known as: INDERAL Take 20 mg by mouth daily.   senna 8.6 MG Tabs tablet Commonly known as: SENOKOT Take 1 tablet (8.6 mg total) by mouth 2 (two) times daily before a meal.   Symbicort 160-4.5 MCG/ACT inhaler Generic drug: budesonide-formoterol Inhale 2 puffs into the lungs daily as needed (sob/wheezing).   tiotropium 18 MCG inhalation capsule Commonly known as: SPIRIVA Place 18 mcg into inhaler and inhale daily.    torsemide 20 MG tablet Commonly known as: DEMADEX Take 2 tablets (40 mg total) by mouth 2 (two) times daily.   zolpidem 5 MG tablet Commonly known as: AMBIEN Take 5 mg by mouth at bedtime as needed for sleep.       Discharge Assessment: Vitals:   03/17/20 0506 03/17/20 0843  BP: (!) 141/71   Pulse: 83   Resp: 17   Temp: 97.8 F (36.6 C)   SpO2: 93% 94%   Skin clean, dry and intact without evidence of skin break down, no evidence of skin tears noted. IV catheter discontinued intact. Site without signs and symptoms of complications - no redness or edema noted at insertion site, patient denies c/o pain - only slight tenderness at site.  Dressing with slight pressure applied.  D/c Instructions-Education: Discharge instructions given to patient/family with verbalized understanding. D/c education completed with patient/family including follow up instructions, medication list, d/c activities limitations if indicated, with other d/c instructions as indicated by MD - patient able to verbalize understanding, all questions fully answered. Patient instructed to return to ED, call 911, or call MD for any changes in condition.  Patient escorted via [place], and D/C home via private auto.  Atilano Ina, RN 03/17/2020 12:36 PM

## 2020-03-17 NOTE — Progress Notes (Signed)
RN went downstairs to pick patients clothes to DC home. While RN downstairs patient started having emesis. PO Zofran given with some relief. Patient states he cannot leave and that he is too sick to go home at this time. He has sent his wife home and refusing discharge at this time. MD paged.

## 2020-03-17 NOTE — Progress Notes (Signed)
Occupational Therapy Evaluation Patient Details Name: [name] MRN: [id number] DOB: 25-Jun-1941 Today's Date: 03/17/2020    History of Present Illness 79 yo male admitted with Pna. Hx of bipolar d/o, COPD-O2 dep, DM, CAD, CHF, hiatal hernia, fall. Left AMA last hospital visit at [facility]   Patient demonstrates ability to transfer to bedside commode without physical assistance. Patient reports minimal activity at baseline, primarily sits on couch most of the day. Patient reports his spouse sets him up with meals, clothes to get dressed, and walks maybe 10-16ft at a time. Patient has bedside commode which he transfers to at home without help. No needs identified at this time, will discontinue acute OT services at this time.    Follow Up Recommendations  No OT follow up    Equipment Recommendations  None recommended by OT       Precautions / Restrictions Precautions Precautions: Fall Precaution Comments: O2 dep @ baseline Restrictions Weight Bearing Restrictions: No      Mobility Bed Mobility Overal bed mobility: Modified Independent                Transfers Overall transfer level: Modified independent Equipment used: None Transfers: Stand Pivot Transfers   Stand pivot transfers: Modified independent (Device/Increase time)       General transfer comment: increased time, no physical assist required     Balance Overall balance assessment: Mild deficits observed, not formally tested                                         ADL either performed or assessed with clinical judgement   ADL Overall ADL's : At baseline                                       General ADL Comments: patient transferred to bedside commode and back without assistance, does not engage in much activity at baseline, often has spouse assist for ADLs                  Pertinent Vitals/Pain Pain Assessment: Faces Faces Pain Scale: Hurts  little more Pain Location: back, knee Pain Descriptors / Indicators: Sore Pain Intervention(s): Monitored during session     Hand Dominance Right   Extremity/Trunk Assessment Upper Extremity Assessment Upper Extremity Assessment: Overall WFL for tasks assessed   Lower Extremity Assessment Lower Extremity Assessment: Defer to PT evaluation       Communication Communication Communication: No difficulties   Cognition Arousal/Alertness: Awake/alert Behavior During Therapy: WFL for tasks assessed/performed Overall Cognitive Status: Within Functional Limits for tasks assessed                                 General Comments: patient expressing wanting to go home today, frustrated waiting for process to take place   General Comments  VSS on 6L            Home Living Family/patient expects to be discharged to:: Private residence Living Arrangements: Spouse/significant other Available Help at Discharge: Family Type of Home: House Home Access: Stairs to enter Technical brewer of Steps: 6 Entrance Stairs-Rails: Right;Left Home Layout: One level     Bathroom Shower/Tub: Other (comment)   Bathroom Toilet: Standard Bathroom Accessibility: Yes  How Accessible: Accessible via walker Home Equipment: [place] - 2 wheels;Cane - single point;Bedside commode   Additional Comments: Pt reports that he does not amb more than 10-15 ft at a time. Sits on couch all day and sponge bathes every few weeks      Prior Functioning/Environment Level of Independence: Needs assistance  Gait / Transfers Assistance Needed: rarely walks more than ~15 ft, On chronic O2, uses RW ADL's / Homemaking Assistance Needed: spouse sets patient up for dressing, assists with sponge bathes, patient utilizes bedside commode mod I   Comments: has had home health in the past-not alway pleased with services        OT Problem List: Decreased activity tolerance       AM-PAC OT "6 Clicks"  Daily Activity     Outcome Measure Help from another person eating meals?: A Little Help from another person taking care of personal grooming?: A Little Help from another person toileting, which includes using toliet, bedpan, or urinal?: None Help from another person bathing (including washing, rinsing, drying)?: A Lot Help from another person to put on and taking off regular upper body clothing?: A Little Help from another person to put on and taking off regular lower body clothing?: A Lot 6 Click Score: 17   End of Session Equipment Utilized During Treatment: Oxygen Nurse Communication: Mobility status  Activity Tolerance: Patient tolerated treatment well Patient left: in bed;with call bell/phone within reach  OT Visit Diagnosis: Other abnormalities of gait and mobility (R26.89)                Time: [id number] OT Time Calculation (min): 26 min Charges:  OT General Charges $OT Visit: 1 Visit OT Evaluation $OT Eval Moderate Complexity: 1 Mod  Delbert Phenix OT Pager: [place] 03/17/2020, 12:46 PM

## 2020-03-17 NOTE — Discharge Summary (Signed)
Physician Discharge Summary  [name] [id number] DOB: 1941/04/10 DOA: 03/14/2020  PCP: [name], MD  Admit date: 03/14/2020 Discharge date: 03/17/2020  Admitted From: Home  Discharge disposition: Home with home health   Recommendations for Outpatient Follow-Up:   . Follow up with your primary care provider in one week.  . Check CBC, BMP , magnesium in the next visit. . Continue oxygen at home. . Chest x-ray in 4 to 6 weeks to assess for resolution of pneumonia.  Discharge Diagnosis:   Principal Problem:   Lobar pneumonia ([place]) Active Problems:   Hyperlipidemia   Essential hypertension   COPD (chronic obstructive pulmonary disease) (HCC)   CAD (coronary artery disease)   Chronic hypoxemic respiratory failure (HCC)   Insomnia   Iron deficiency anemia due to chronic blood loss   AKI (acute kidney injury) ([place])   Type 2 diabetes mellitus (HCC)   Acute on chronic diastolic CHF (congestive heart failure) (HCC)   Acute on chronic respiratory failure with hypoxia (HCC)   Anxiety with depression   Hyperkalemia   Left knee pain   Discharge Condition: Improved.  Diet recommendation: Low sodium, heart healthy.  Carbohydrate-modified. .  Wound care: None.  Code status: Full.   History of Present Illness:   79 year old manpresented with shortness of breath, productive cough and worsening fatigue, orthopnea, lower extremity edema. Admitted for lobar pneumonia, acute on chronic diastolic CHF.   Hospital Course:   Following conditions were addressed during hospitalization as listed below,  Acute lobar pneumonia,left lower lobe with shortness of breath, complicated by COPD, chronic hypoxic respiratory failure on 6 L nasal cannula at home. Received IV antibiotic while in the hospital.  Patient has been at his baseline status will continue oral antibiotic on discharge to complete the course.  Patient is allergic to penicillin.  Continue with doxycycline.  He  was advised to continue bronchodilators and supplemental oxygen on discharge.   Acute on chronic diastolic CHF Patient was advised to continue Lasix on discharge.  Fluid restriction dietary modification advised.   He was advised to be compliant on his oral medication regimen.  AKIwithmodest hyperkalemia, superimposed on CKD stage IIIa ACE inhibitor on hold during hospitalization..  Creatinine of 1.2 prior to discharge.  Patient is on low-dose lisinopril at home.  Will resume on discharge.  Will need CBC BMP in the next visit.   Chronic fatigue, essentially bedbound status reportedly secondary to left knee pain --PT evaluation recommended home health on discharge.  Symptomatic iron deficiency anemia secondary to chronic blood loss --Status post 1 unit PRBC with appropriate response in hemoglobin. IMA globin of 9.0 prior to discharge. 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  No active bleeding at this time.  Chronic left knee pain since October 2020 --MRI October 2020 left knee showed inferior articular surface tearing involving posterior horn, possible medial tibial bone contusion, stress reaction or subtle subchondral stress fracture, moderate to large joint effusion.  xray of the knee without any acute findings or effusion.  Examination of the knee is benign.  Continue gabapentin.  Physical therapy, and home health on discharge.  Patient was advised to keep the orthopedic evaluation as outpatient.   Diabetes mellitus type 2 Received Lantus twice a day and sliding scale insulin while in the hospital.  Reportedly not using Lantus at home.  Hemoglobin A1c of 6.7.  Advised compliance  on discharge.  Bipolar 1 disorder --appears stable.  Slightly elevated in his mood and irritable at times.  Aortic atherosclerosis --Continue  statin.  Disposition.  At this time, patient is stable for disposition home.  Patient will need to complete his course of antibiotic.  Medical Consultants:    None.  Procedures:     5/29: 1 unit PRBC transfusion Subjective:   Today, patient complains of mild knee pain.  Denies increasing shortness of breath, chest pain, fever.  He appears irritable at times and gets upset easily.  Discharge Exam:   Vitals:   03/17/20 0506 03/17/20 0843  BP: (!) 141/71   Pulse: 83   Resp: 17   Temp: 97.8 F (36.6 C)   SpO2: 93% 94%   Vitals:   03/16/20 2134 03/17/20 0500 03/17/20 0506 03/17/20 0843  BP: (!) 149/82  (!) 141/71   Pulse: 83  83   Resp: 17  17   Temp: 97.9 F (36.6 C)  97.8 F (36.6 C)   TempSrc: Oral  Oral   SpO2: 95%  93% 94%  Weight:  123.4 kg    Height:        General: Alert awake, not in obvious distress, oxygen.  Obese.  Irritable mood. HENT: pupils equally reacting to light,  No scleral pallor or icterus noted. Oral mucosa is moist.  Chest:   Diminished breath sounds bilaterally.   CVS: S1 &S2 heard. No murmur.  Regular rate and rhythm. Abdomen: Soft, nontender, nondistended.  Bowel sounds are heard.   Extremities: No cyanosis, clubbing or edema.  Peripheral pulses are palpable. Psych: Alert, awake and oriented, elevated mood gets irritable easily CNS:  No cranial nerve deficits.  Power equal in all extremities.   Skin: Warm and dry.  No rashes noted.  The results of significant diagnostics from this hospitalization (including imaging, microbiology, ancillary and laboratory) are listed below for reference.     Diagnostic Studies:   DG Chest Port 1 View  Result Date: 03/14/2020 CLINICAL DATA:  Shortness of breath and weakness EXAM: PORTABLE CHEST 1 VIEW COMPARISON:  Feb 25, 2020 FINDINGS: There is ill-defined opacity in the lateral left base concerning for a focus of pneumonia, potentially with small associated pleural effusion. There is mild bibasilar  atelectasis. There is also slight atelectasis in the left upper lobe. Heart is upper normal in size with pulmonary vascularity. Patient is status post coronary artery bypass grafting. There is a large hiatal type hernia. There is aortic atherosclerosis. No bone lesions. IMPRESSION: Suspect pneumonia with small pleural effusion left base. Scattered areas of atelectatic change in the bases and left mid lung. Stable cardiac silhouette. Large hiatal type hernia. Status post coronary artery bypass grafting. Aortic Atherosclerosis (ICD10-I70.0). Electronically Signed   By: Lowella Grip III M.D.   On: 03/14/2020 19:17   DG Knee Complete 4 Views Left  Result Date: 03/15/2020 CLINICAL DATA:  Continued chronic left knee pain. EXAM: LEFT KNEE - COMPLETE 4+ VIEW COMPARISON:  None. FINDINGS: Vascular calcifications. No fracture or effusion. No significant degenerative changes. No bony lesions. IMPRESSION: No acute abnormalities.  No cause for pain identified. Electronically Signed   By: Dorise Bullion III M.D   On: 03/15/2020 12:23   ECHOCARDIOGRAM COMPLETE  Result Date: 03/15/2020    ECHOCARDIOGRAM REPORT   Patient Name:   [name] Date of Exam: 03/15/2020 Medical Rec #:  [id number]        Height:       75.0 in Accession #:  [id number]       Weight:       272.3 lb Date of Birth:  1941-03-23        BSA:          2.502 m Patient Age:    89 years         BP:           154/82 mmHg Patient Gender: M                HR:           98 bpm. Exam Location:  Inpatient Procedure: 2D Echo Indications:    CHF-Acute Diastolic [id number] / [id number]  History:        Patient has prior history of Echocardiogram examinations, most                 recent 05/06/2019. CAD, COPD; Risk Factors:Hypertension, Diabetes                 and Dyslipidemia. AAA                 AKI.  Sonographer:    Vikki Ports Turrentine Referring Phys: [id number] [place]  1. Normal LV systolic function; mild LVH; grade 1 diastolic dysfunction;  mild AS with mean gradient 14 mmHg.  2. Left ventricular ejection fraction, by estimation, is 55 to 60%. The left ventricle has normal function. The left ventricle has no regional wall motion abnormalities. There is mild left ventricular hypertrophy. Left ventricular diastolic parameters are consistent with Grade I diastolic dysfunction (impaired relaxation).  3. Right ventricular systolic function is normal. The right ventricular size is normal. There is normal pulmonary artery systolic pressure.  4. The mitral valve is normal in structure. Trivial mitral valve regurgitation. No evidence of mitral stenosis.  5. The aortic valve is tricuspid. Aortic valve regurgitation is not visualized. Mild aortic valve stenosis.  6. The inferior vena cava is dilated in size with >50% respiratory variability, suggesting right atrial pressure of 8 mmHg. FINDINGS  Left Ventricle: Left ventricular ejection fraction, by estimation, is 55 to 60%. The left ventricle has normal function. The left ventricle has no regional wall motion abnormalities. The left ventricular internal cavity size was normal in size. There is  mild left ventricular hypertrophy. Left ventricular diastolic parameters are consistent with Grade I diastolic dysfunction (impaired relaxation). Right Ventricle: The right ventricular size is normal. Right ventricular systolic function is normal. There is normal pulmonary artery systolic pressure. The tricuspid regurgitant velocity is 2.26 m/s, and with an assumed right atrial pressure of 8 mmHg,  the estimated right ventricular systolic pressure is [id number] mmHg. Left Atrium: Left atrial size was normal in size. Right Atrium: Right atrial size was normal in size. Pericardium: There is no evidence of pericardial effusion. Mitral Valve: The mitral valve is normal in structure. Normal mobility of the mitral valve leaflets. Trivial mitral valve regurgitation. No evidence of mitral valve stenosis. Tricuspid Valve: The tricuspid  valve is normal in structure. Tricuspid valve regurgitation is trivial. No evidence of tricuspid stenosis. Aortic Valve: The aortic valve is tricuspid. Aortic valve regurgitation is not visualized. Mild aortic stenosis is present. Aortic valve mean gradient measures 12.2 mmHg. Aortic valve peak gradient measures 19.2 mmHg. Aortic valve area, by VTI measures 2.72 cm. Pulmonic Valve: The pulmonic valve was not well visualized. Pulmonic valve regurgitation is not visualized. No evidence of pulmonic stenosis. Aorta: The aortic root is normal in size and structure. Venous: The inferior  vena cava is dilated in size with greater than 50% respiratory variability, suggesting right atrial pressure of 8 mmHg.  Additional Comments: Normal LV systolic function; mild LVH; grade 1 diastolic dysfunction; mild AS with mean gradient 14 mmHg.  LEFT VENTRICLE PLAX 2D LVIDd:         4.80 cm  Diastology LVIDs:         3.70 cm  LV e' lateral:   13.60 cm/s LV PW:         1.10 cm  LV E/e' lateral: 7.6 LV IVS:        1.20 cm LVOT diam:     2.30 cm LV SV:         118 LV SV Index:   47 LVOT Area:     4.15 cm  RIGHT VENTRICLE TAPSE (M-mode): 1.9 cm LEFT ATRIUM             Index       RIGHT ATRIUM           Index LA diam:        3.10 cm 1.24 cm/m  RA Area:     22.80 cm LA Vol (A2C):   55.7 ml 22.26 ml/m RA Volume:   69.40 ml  27.74 ml/m LA Vol (A4C):   62.7 ml 25.06 ml/m LA Biplane Vol: 60.2 ml 24.06 ml/m  AORTIC VALVE AV Area (Vmax):    2.50 cm AV Area (Vmean):   2.61 cm AV Area (VTI):     2.72 cm AV Vmax:           219.25 cm/s AV Vmean:          163.750 cm/s AV VTI:            0.433 m AV Peak Grad:      19.2 mmHg AV Mean Grad:      12.2 mmHg LVOT Vmax:         132.00 cm/s LVOT Vmean:        103.000 cm/s LVOT VTI:          0.283 m LVOT/AV VTI ratio: 0.65  AORTA Ao Root diam: 3.60 cm MITRAL VALVE                TRICUSPID VALVE MV Area (PHT): 5.84 cm     TR Peak grad:   20.4 mmHg MV Decel Time: 130 msec     TR Vmax:        226.00 cm/s  MV E velocity: 104.00 cm/s MV A velocity: 136.00 cm/s  SHUNTS MV E/A ratio:  0.76         Systemic VTI:  0.28 m                             Systemic Diam: 2.30 cm Kirk Ruths MD Electronically signed by Kirk Ruths MD Signature Date/Time: 03/15/2020/1:29:59 PM    Final      Labs:   Basic Metabolic Panel: Recent Labs  Lab 03/14/20 1856 03/14/20 1856 03/14/20 1928 03/15/20 0423 03/15/20 0423 03/16/20 0306 03/17/20 0333  NA 138  --   --  134*  --  133* 134*  K 4.5   < >  --  5.4*   < > 4.2 3.7  CL 89*  --   --  87*  --  87* 89*  CO2 37*  --   --  34*  --  31 35*  GLUCOSE 127*  --   --  360*  --  282* 157*  BUN 18  --   --  24*  --  33* 33*  CREATININE 1.08  --   --  1.47*  --  1.29* 1.24  CALCIUM 8.6*  --   --  8.8*  --  8.5* 8.4*  MG  --   --  1.9  --   --   --  2.0  PHOS  --   --  2.9  --   --   --   --    < > = values in this interval not displayed.   GFR Estimated Creatinine Clearance: 69.5 mL/min (by C-G formula based on SCr of 1.24 mg/dL). Liver Function Tests: Recent Labs  Lab 03/15/20 0423  AST 30  ALT 9  ALKPHOS 105  BILITOT 0.7  PROT 7.3  ALBUMIN 3.6   No results for input(s): LIPASE, AMYLASE in the last 168 hours. No results for input(s): AMMONIA in the last 168 hours. Coagulation profile No results for input(s): INR, PROTIME in the last 168 hours.  CBC: Recent Labs  Lab 03/14/20 1856 03/15/20 0423 03/16/20 0306 03/17/20 0333  WBC 7.0 8.0 11.2* 8.5  NEUTROABS 5.1  --   --   --   HGB 8.3* 9.7* 9.4* 9.0*  HCT 29.0* 33.1* 32.5* 31.1*  MCV 89.0 86.6 88.3 88.6  PLT 240 168 231 214   Cardiac Enzymes: No results for input(s): CKTOTAL, CKMB, CKMBINDEX, TROPONINI in the last 168 hours. BNP: Invalid input(s): POCBNP CBG: Recent Labs  Lab 03/16/20 0745 03/16/20 1131 03/16/20 1727 03/16/20 2130 03/17/20 0757  GLUCAP 220* 196* 179* 176* 148*   D-Dimer No results for input(s): DDIMER in the last 72 hours. Hgb A1c Recent Labs     03/14/20 2302  HGBA1C 6.7*   Lipid Profile No results for input(s): CHOL, HDL, LDLCALC, TRIG, CHOLHDL, LDLDIRECT in the last 72 hours. Thyroid function studies No results for input(s): TSH, T4TOTAL, T3FREE, THYROIDAB in the last 72 hours.  Invalid input(s): FREET3 Anemia work up No results for input(s): VITAMINB12, FOLATE, FERRITIN, TIBC, IRON, RETICCTPCT in the last 72 hours. Microbiology Recent Results (from the past 240 hour(s))  SARS Coronavirus 2 by RT PCR (hospital order, performed in [facility] hospital lab) Nasopharyngeal Nasopharyngeal Swab     Status: None   Collection Time: 03/14/20  8:10 PM   Specimen: Nasopharyngeal Swab  Result Value Ref Range Status   SARS Coronavirus 2 NEGATIVE NEGATIVE Final    Comment: (NOTE) SARS-CoV-2 target nucleic acids are NOT DETECTED. The SARS-CoV-2 RNA is generally detectable in upper and lower respiratory specimens during the acute phase of infection. The lowest concentration of SARS-CoV-2 viral copies this assay can detect is 250 copies / mL. A negative result does not preclude SARS-CoV-2 infection and should not be used as the sole basis for treatment or other patient management decisions.  A negative result may occur with improper specimen collection / handling, submission of specimen other than nasopharyngeal swab, presence of viral mutation(s) within the areas targeted by this assay, and inadequate number of viral copies (<250 copies / mL). A negative result must be combined with clinical observations, patient history, and epidemiological information. Fact Sheet for Patients:   StrictlyIdeas.no Fact Sheet for Healthcare Providers: BankingDealers.co.za This test is not yet approved or cleared  by the [place] FDA and has been authorized for detection and/or diagnosis of SARS-CoV-2 by FDA under an Emergency Use Authorization (EUA).  This EUA will remain in effect (meaning  this  test can be used) for the duration of the COVID-19 declaration under Section 564(b)(1) of the Act, 21 U.S.C. section 360bbb-3(b)(1), unless the authorization is terminated or revoked sooner. Performed at [facility], [place] [address], [place], [place] [address]      Discharge Instructions:   Discharge Instructions    Diet - low sodium heart healthy   Complete by: As directed    Diet Carb Modified   Complete by: As directed    Discharge instructions   Complete by: As directed    Complete the course of antibiotic.  Follow-up with your primary care physician in 1 week.  Follow-up with orthopedic doctor for your left knee pain.  Use brace and Ace bandage at home.  Use oxygen at home.   Increase activity slowly   Complete by: As directed      Allergies as of 03/17/2020      Reactions   Morphine And Related Shortness Of Breath, Nausea And Vomiting, Rash, Other (See Comments)   Reaction:  Hallucinations    Penicillins Anaphylaxis, Hives, Other (See Comments)   10/02/18 - discussed with patient and he states he can tolerate penicillin capsules and had some hives when he was 79yo and received pcn injections Has patient had a PCN reaction causing immediate rash, facial/tongue/throat swelling, SOB or lightheadedness with hypotension: Yes Has patient had a PCN reaction causing severe rash involving mucus membranes or skin necrosis: No Has patient had a PCN reaction that required hospitalization No Has patient had a PCN reaction occurring within the last 10 y   Zolpidem Shortness Of Breath   Demerol [meperidine] Other (See Comments)   Reaction:  Hallucinations     Dilaudid [hydromorphone Hcl] Other (See Comments)   Reaction:  Hallucinations    Levofloxacin Other (See Comments)   Reaction:  Unknown       Medication List    TAKE these medications   acetaminophen 325 MG tablet Commonly known as: TYLENOL Take 650 mg by mouth every 6 (six) hours as needed for mild  pain.   albuterol 108 (90 Base) MCG/ACT inhaler Commonly known as: VENTOLIN HFA Inhale 1-2 puffs into the lungs every 6 (six) hours as needed for wheezing or shortness of breath.   ALPRAZolam 0.25 MG tablet Commonly known as: XANAX Take 0.25 mg by mouth daily as needed for anxiety.   atorvastatin 40 MG tablet Commonly known as: LIPITOR Take 1 tablet (40 mg total) by mouth daily at 6 PM.   benzonatate 100 MG capsule Commonly known as: TESSALON Take 100 mg by mouth every 8 (eight) hours as needed for cough or congestion.   citalopram 40 MG tablet Commonly known as: CELEXA Take 0.5 tablets (20 mg total) by mouth daily. What changed: how much to take   CVS Melatonin 5 MG Tabs Generic drug: melatonin Take 5 mg by mouth at bedtime. For sleep   diclofenac Sodium 1 % Gel Commonly known as: Voltaren Apply 4 g topically 2 (two) times daily as needed (for bilateral knee pain).   doxycycline 100 MG tablet Commonly known as: VIBRA-TABS Take 1 tablet (100 mg total) by mouth 2 (two) times daily for 5 days.   fluticasone 50 MCG/ACT nasal spray Commonly known as: FLONASE Place 2 sprays into both nostrils daily. What changed:   when to take this  reasons to take this   furosemide 20 MG tablet Commonly known as: LASIX Take 20 mg by mouth 2 (two) times daily.   gabapentin  800 MG tablet Commonly known as: NEURONTIN Take 400 mg by mouth 4 (four) times daily.   insulin aspart 100 UNIT/ML injection Commonly known as: novoLOG Inject 25 Units into the skin 3 (three) times daily with meals.   insulin glargine 100 UNIT/ML injection Commonly known as: LANTUS Inject 0.35 mLs (35 Units total) into the skin 2 (two) times daily.   Lantus SoloStar 100 UNIT/ML Solostar Pen Generic drug: insulin glargine Inject 35 Units into the skin 2 (two) times daily.   lisinopril 2.5 MG tablet Commonly known as: ZESTRIL Take 1 tablet (2.5 mg total) by mouth daily.   pantoprazole 40 MG  tablet Commonly known as: PROTONIX Take 1 tablet (40 mg total) by mouth 2 (two) times daily.   propranolol 20 MG tablet Commonly known as: INDERAL Take 20 mg by mouth daily.   senna 8.6 MG Tabs tablet Commonly known as: SENOKOT Take 1 tablet (8.6 mg total) by mouth 2 (two) times daily before a meal.   Symbicort 160-4.5 MCG/ACT inhaler Generic drug: budesonide-formoterol Inhale 2 puffs into the lungs daily as needed (sob/wheezing).   tiotropium 18 MCG inhalation capsule Commonly known as: SPIRIVA Place 18 mcg into inhaler and inhale daily.   torsemide 20 MG tablet Commonly known as: DEMADEX Take 2 tablets (40 mg total) by mouth 2 (two) times daily.   zolpidem 5 MG tablet Commonly known as: AMBIEN Take 5 mg by mouth at bedtime as needed for sleep.       Time coordinating discharge: 39 minutes  Signed:  Denaisha Swango  [facility] 03/17/2020, 10:38 AM

## 2020-04-08 IMAGING — DX DG CHEST 1V PORT
1 series · 1 of 1 positions shown · non-contrast
Comparison: August 13, 2019

CLINICAL DATA: Dyspnea and shortness of breath for

EXAM:
PORTABLE CHEST 1 VIEW

[chest ap]
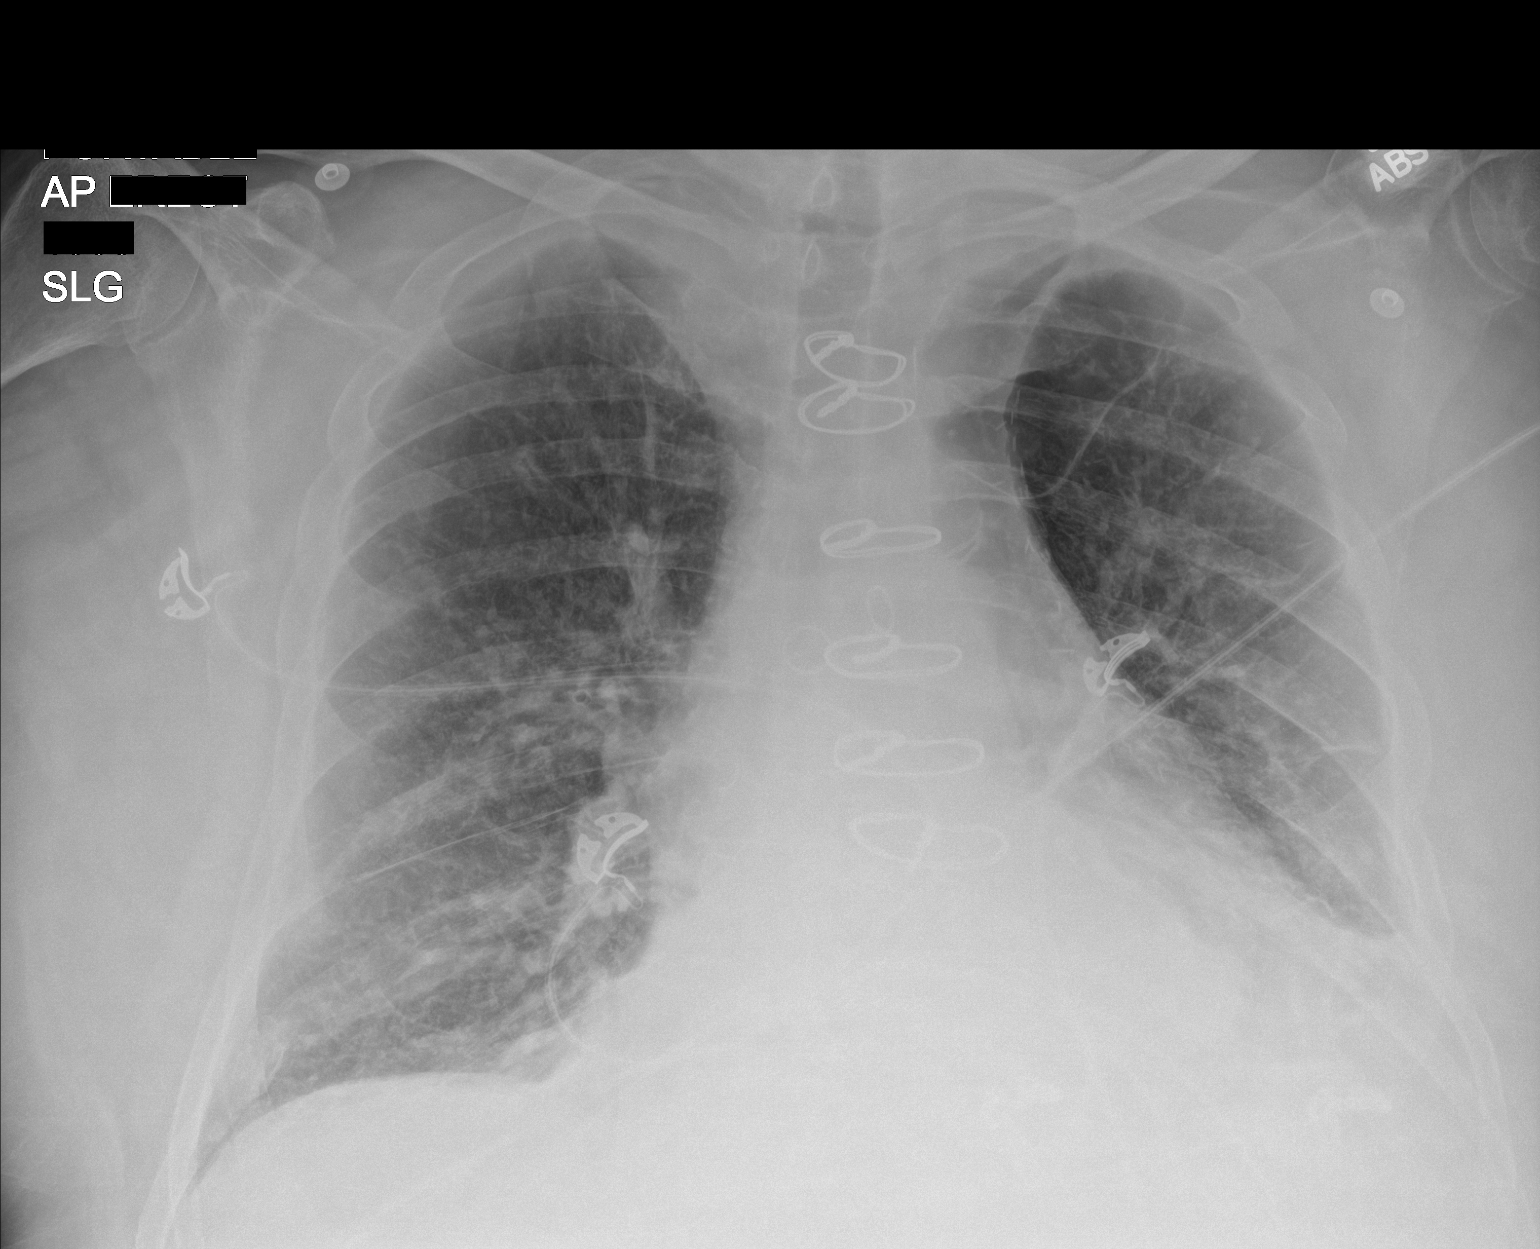

[1 of 1 positions shown; findings below may reference images not displayed]

FINDINGS: Again noted is mild cardiomegaly. There is prominence to the central
pulmonary vasculature. Hazy airspace opacity seen at the left lower
lung not significantly changed since the prior exam. There is also
blunting of the left costophrenic angle, likely small effusion.
Overlying median sternotomy wires.
IMPRESSION: Pulmonary vascular congestion and hazy airspace opacity at the left
lung base which could be layering effusion and/or early infectious
etiology. This is not significantly changed since the prior exam.

Trace left pleural effusion.

## 2020-04-09 IMAGING — DX DG CHEST 1V PORT
3 series · 3 of 3 positions shown · non-contrast
Comparison: Yesterday

CLINICAL DATA: Pulmonary disease

EXAM:
PORTABLE CHEST 1 VIEW

[chest ap (1 of 3)]
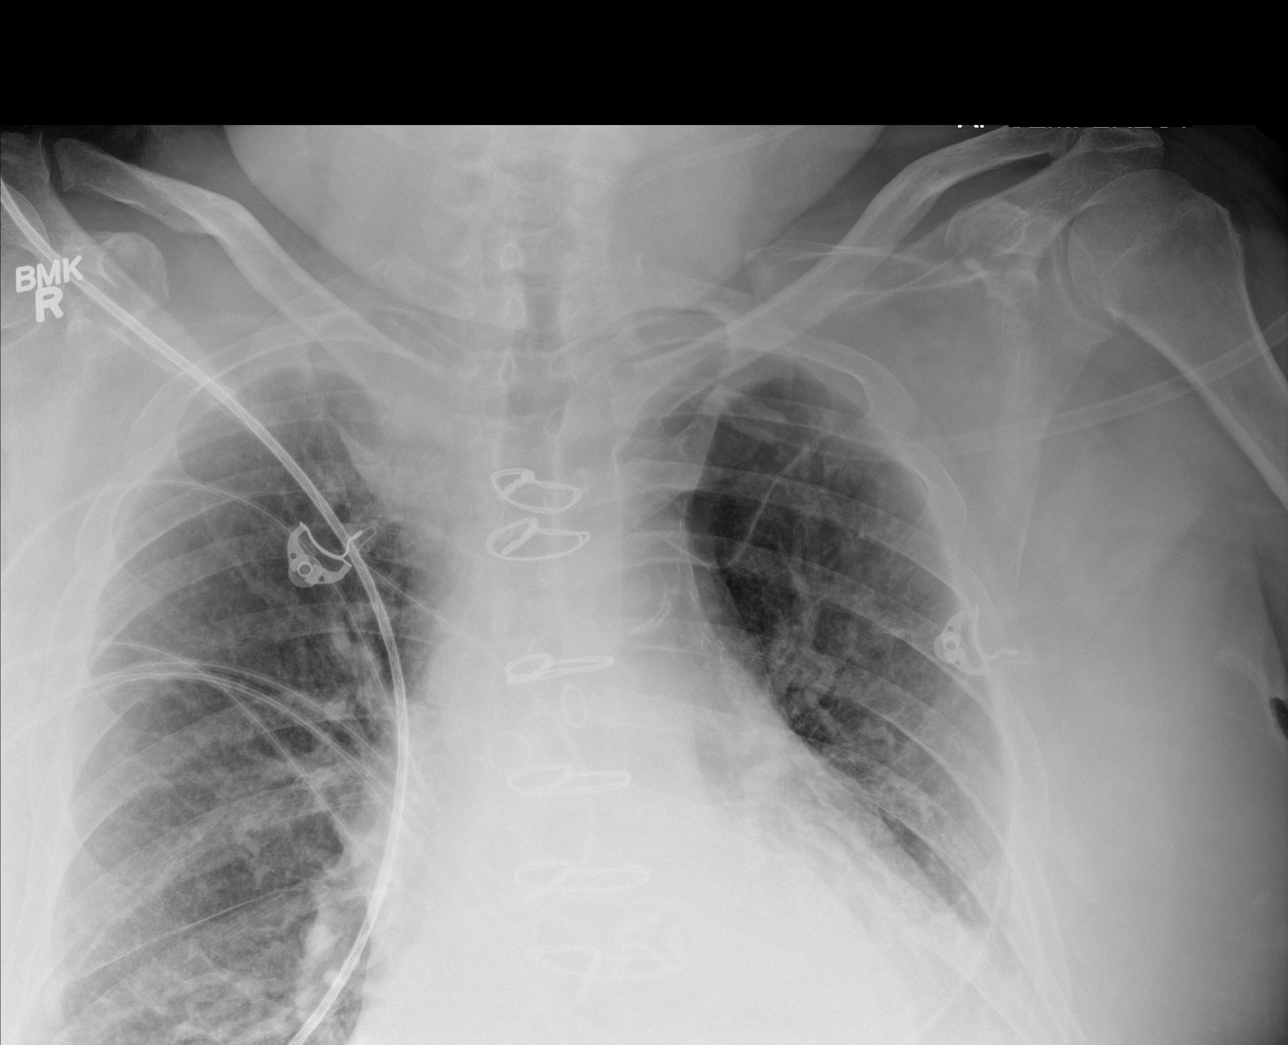

[chest ap (2 of 3)]
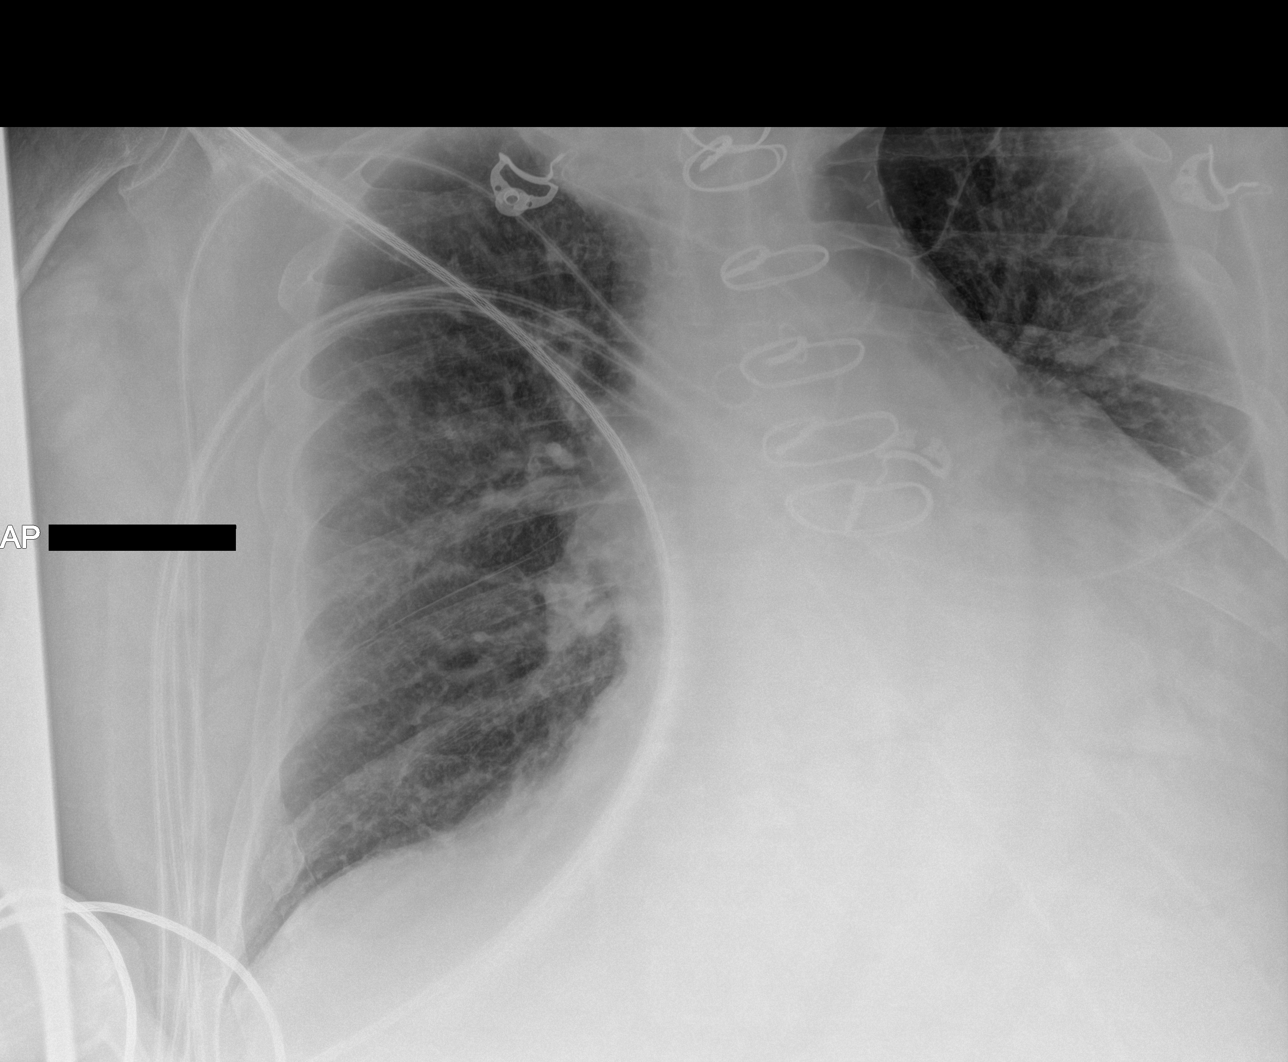

[chest ap (3 of 3)]
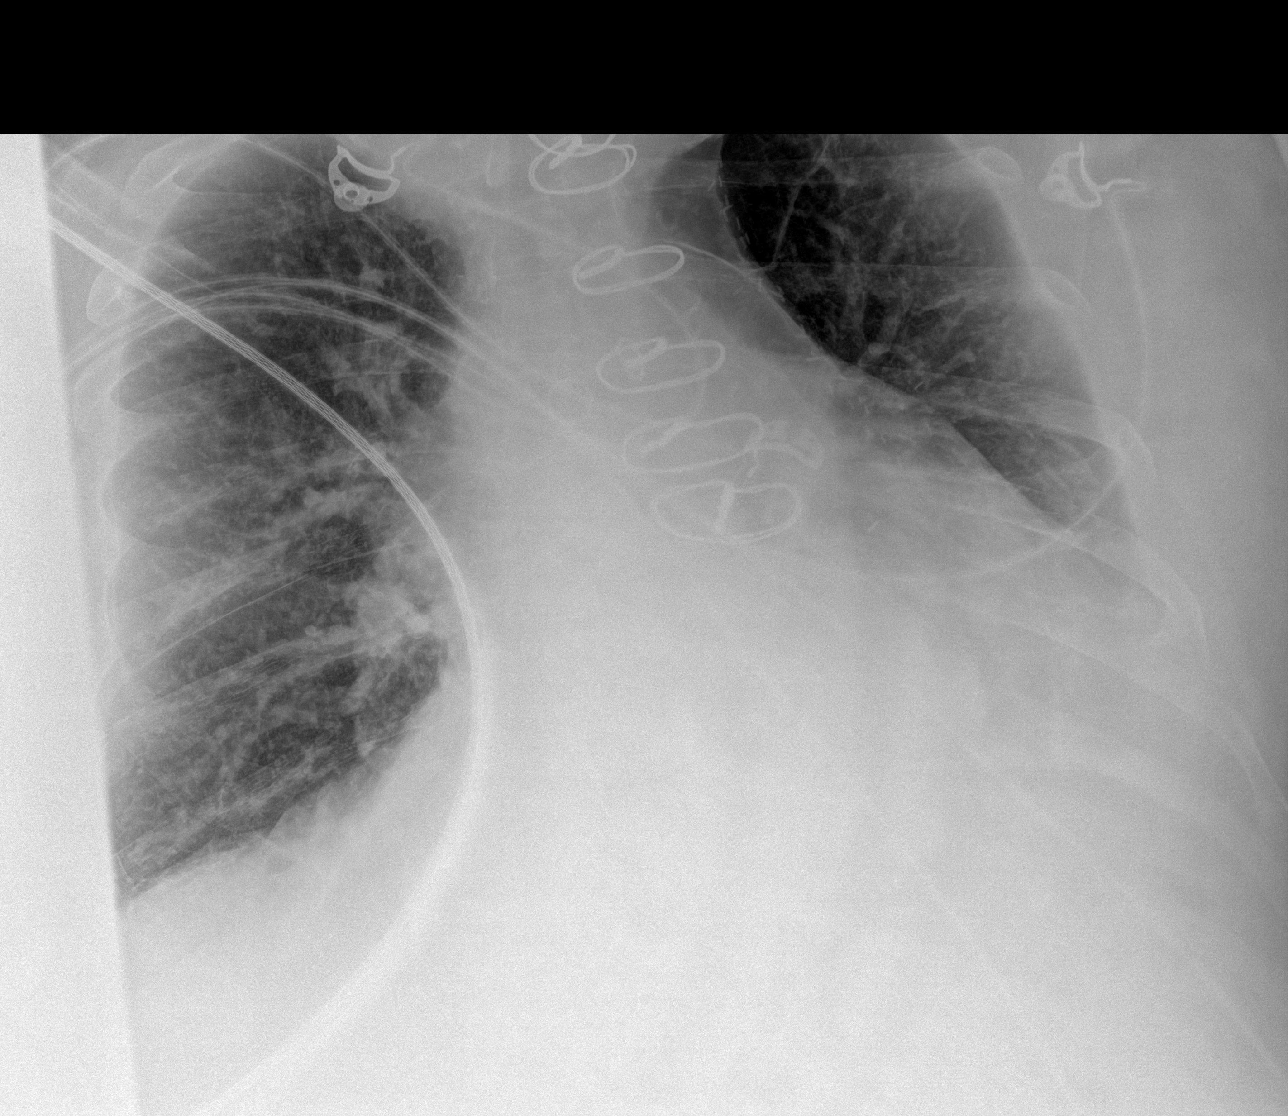

[3 of 3 positions shown; findings below may reference images not displayed]

FINDINGS: Chronic cardiomegaly. Hiatal hernia. Stable mild interstitial
coarsening without Kerley lines. No air bronchogram. There is a bleb
at the left apex. Prior CABG.
IMPRESSION: Stable interstitial coarsening.

Cardiomegaly and hiatal hernia.

## 2020-04-16 IMAGING — DX DG CHEST 1V PORT
1 series · 1 of 1 positions shown · non-contrast
Comparison: 09/06/2019

CLINICAL DATA: Shortness of breath

EXAM:
PORTABLE CHEST 1 VIEW

[chest ap]
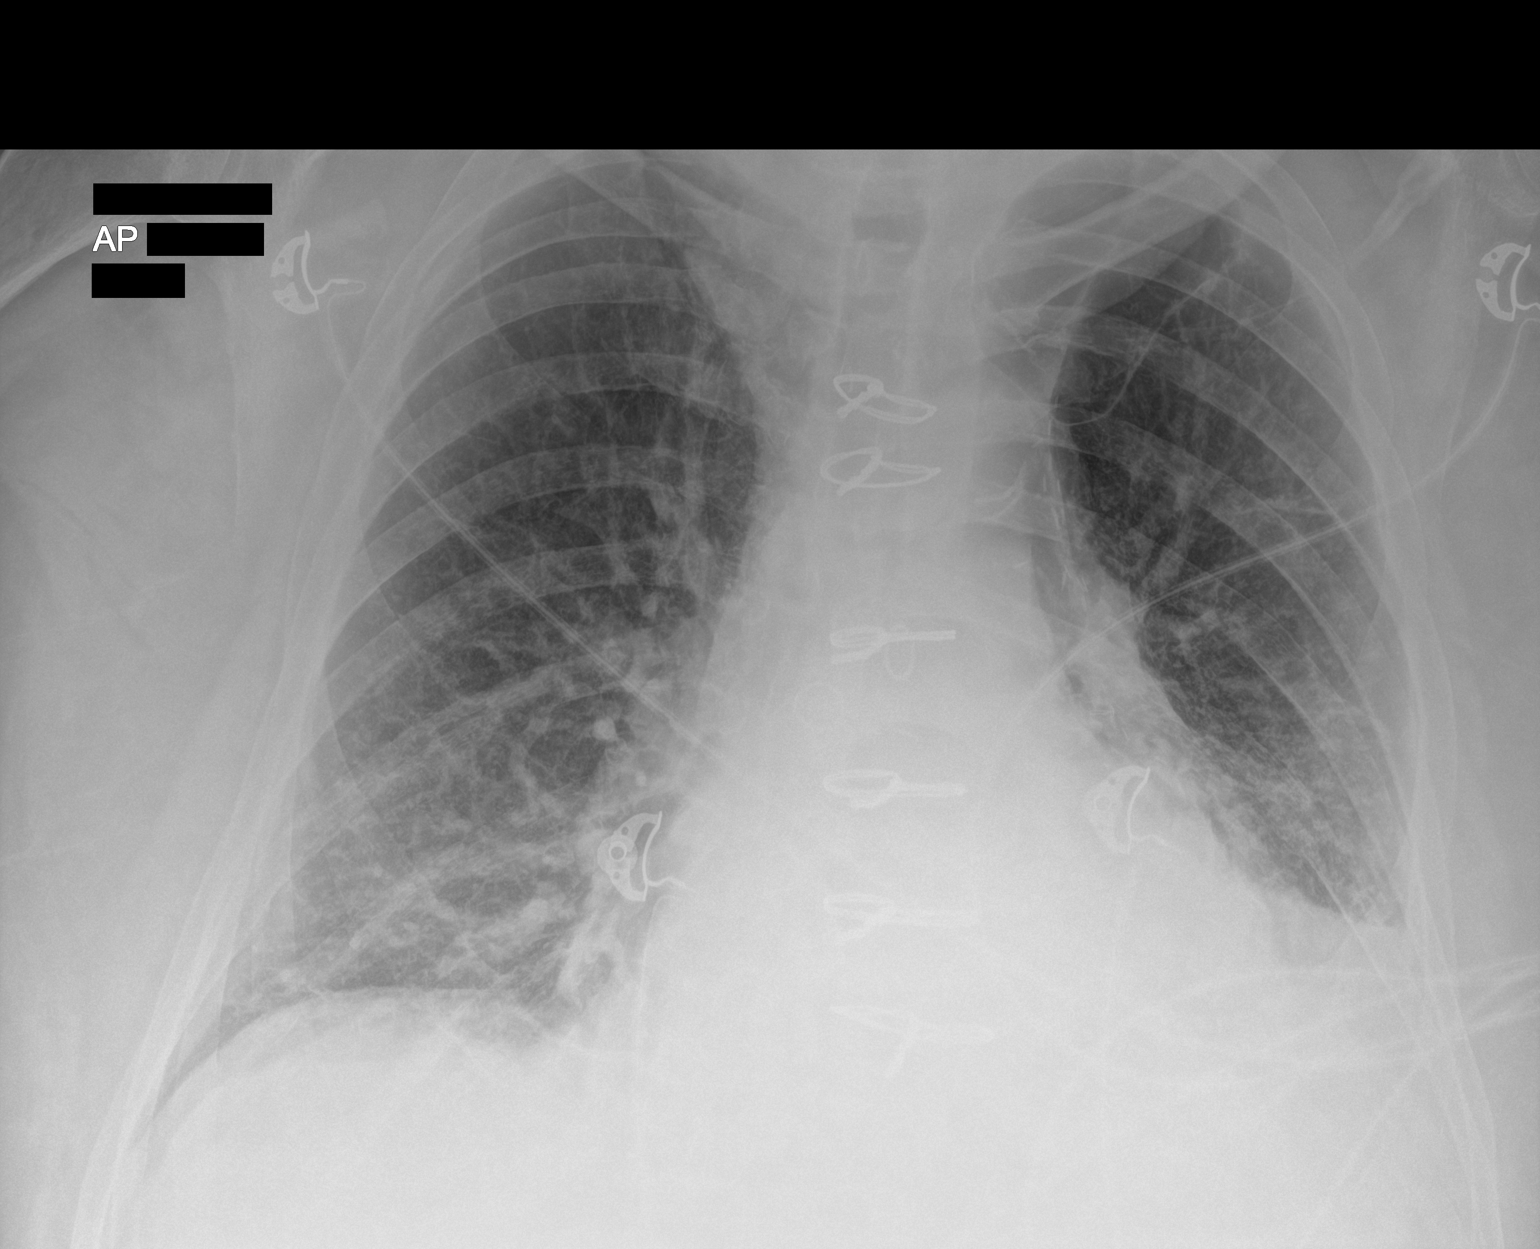

[1 of 1 positions shown; findings below may reference images not displayed]

FINDINGS: Moderate cardiomegaly with bilateral basilar predominant
interstitial opacity. Remote median sternotomy. Small left pleural
effusion.
IMPRESSION: Cardiomegaly with small left pleural effusion and basilar
predominant interstitial opacities that may indicate early pulmonary
edema.

## 2020-04-17 IMAGING — CT CT HEAD W/O CM
3 series · 16 of 47 positions shown, 19 images · non-contrast
Comparison: MRI 05/05/2019, CT 05/05/2019

CLINICAL DATA: Attempted suicide, ingestion

EXAM:
CT HEAD WITHOUT CONTRAST
TECHNIQUE: Contiguous axial images were obtained from the base of the skull
through the vertex without intravenous contrast.

[Series 2: head wo · axial · 0.48mm/px · z∈[-159,-19]mm · 10 of 34 slices shown, 13 images]
[im 3/34  brain]
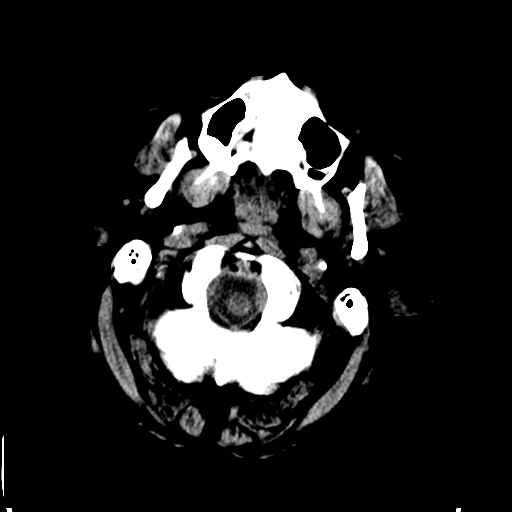
[im 3/34  bone]
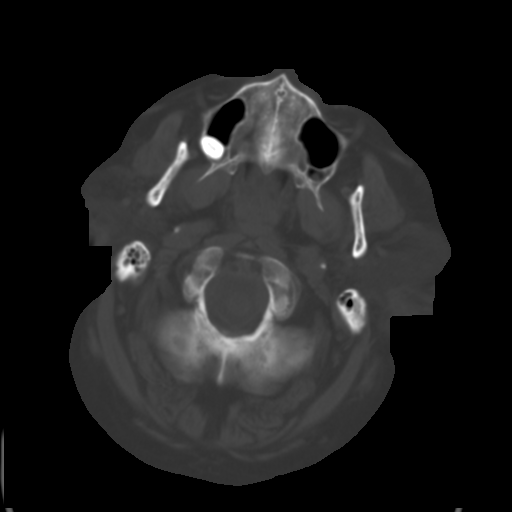
[im 6/34  brain]
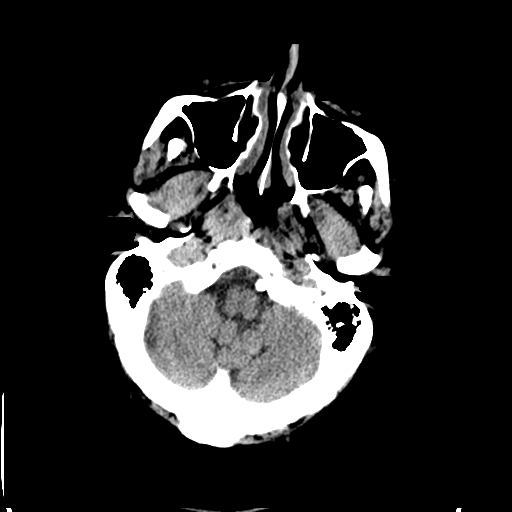
[im 10/34  brain]
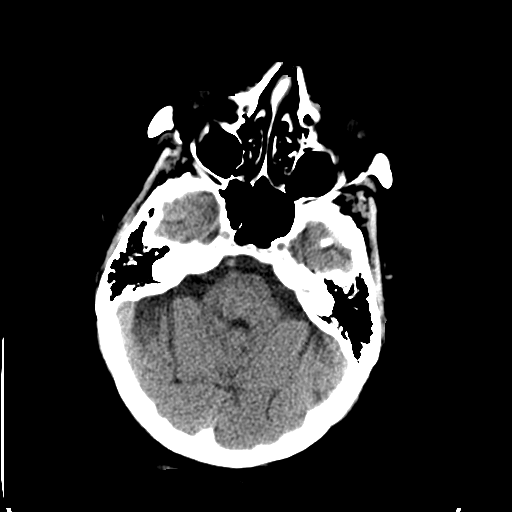
[im 12/34  brain]
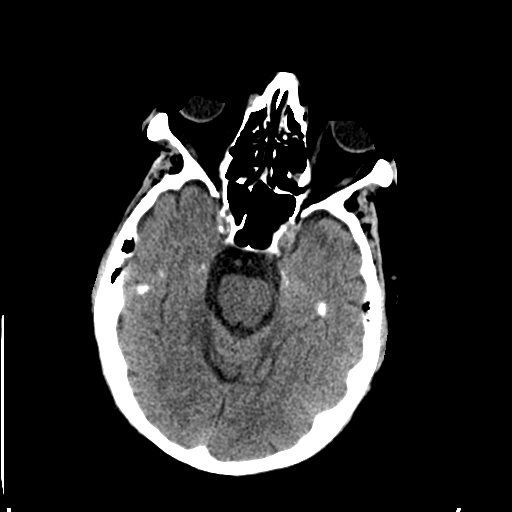
[im 15/34  brain]
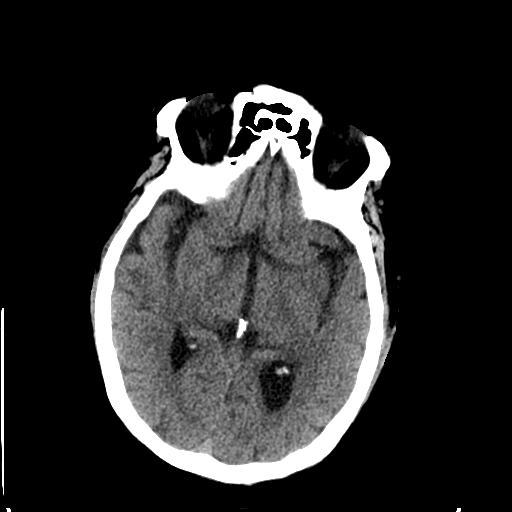
[im 15/34  bone]
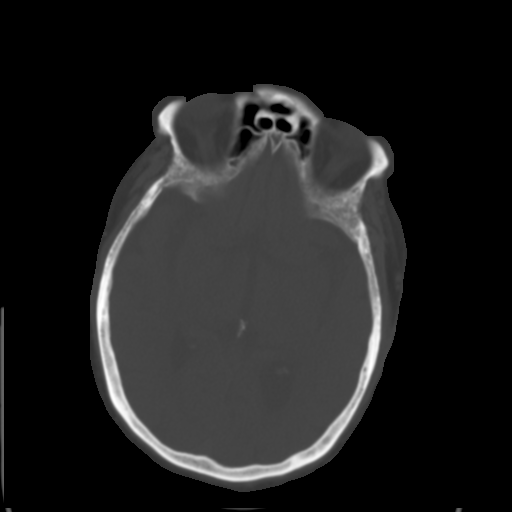
[im 19/34  brain]
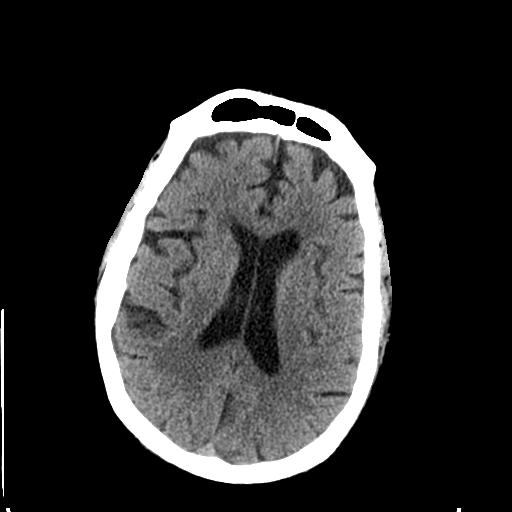
[im 22/34  brain]
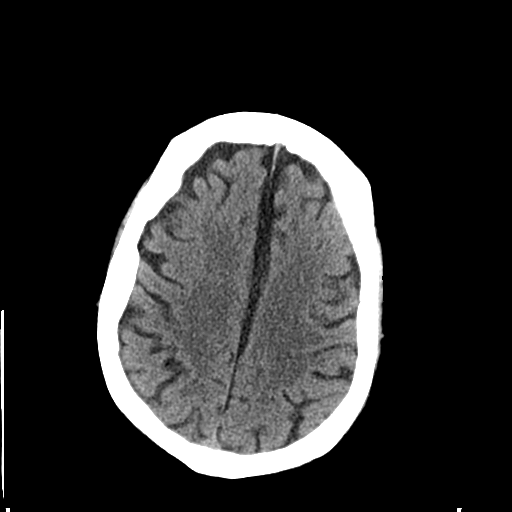
[im 26/34  brain]
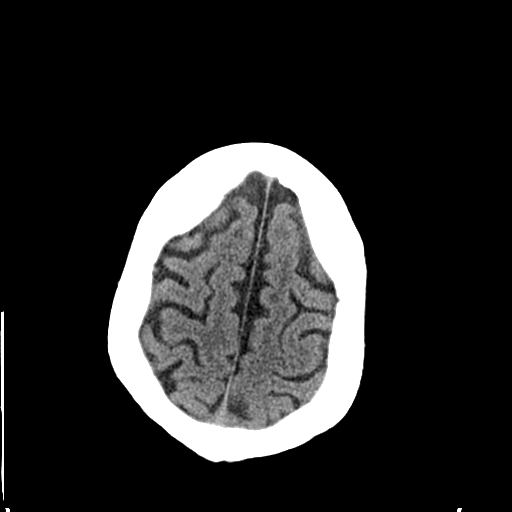
[im 28/34  brain]
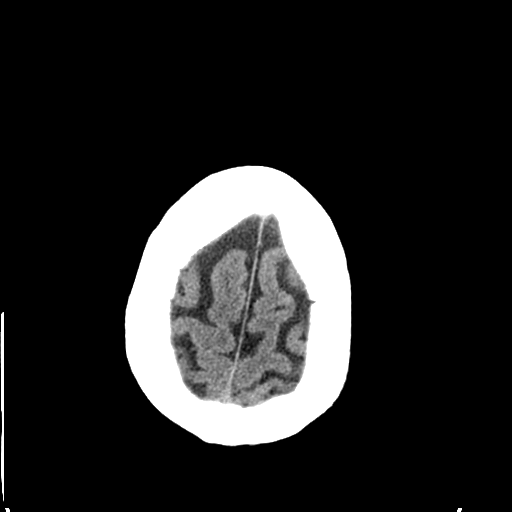
[im 28/34  bone]
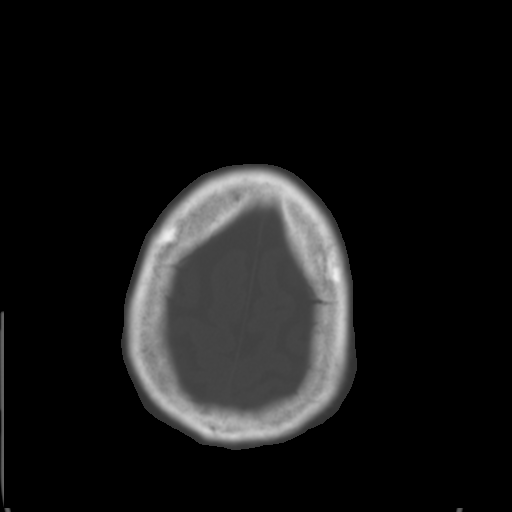
[im 31/34  brain]
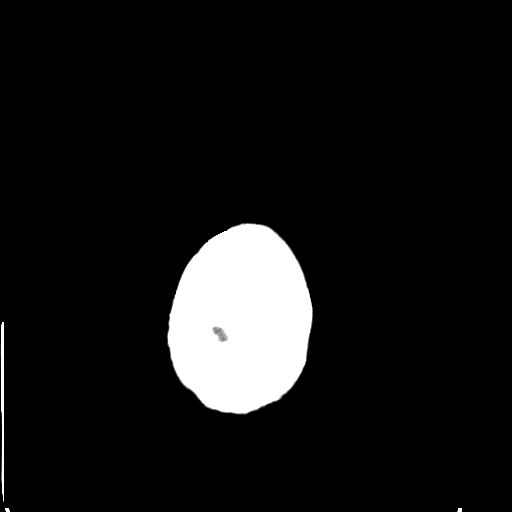

[Series 5: coronal soft tissue · coronal · 0.33mm/px · 3 of 77 slices shown]
[im 26/77  brain]
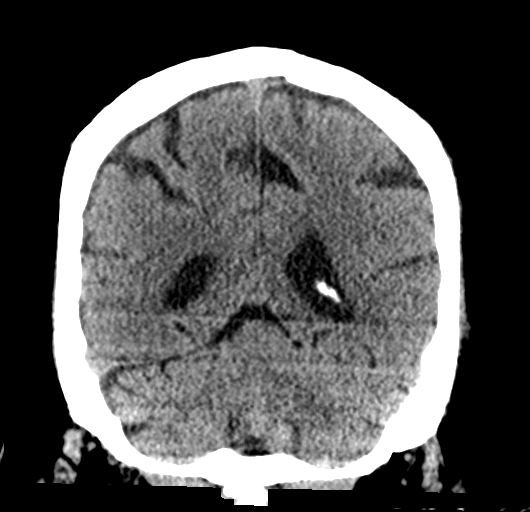
[im 34/77  brain]
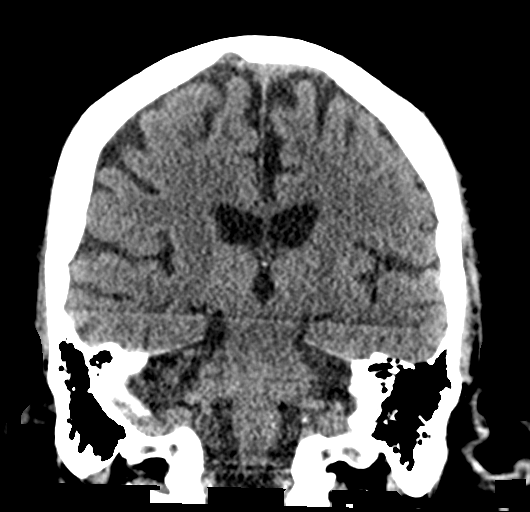
[im 43/77  brain]
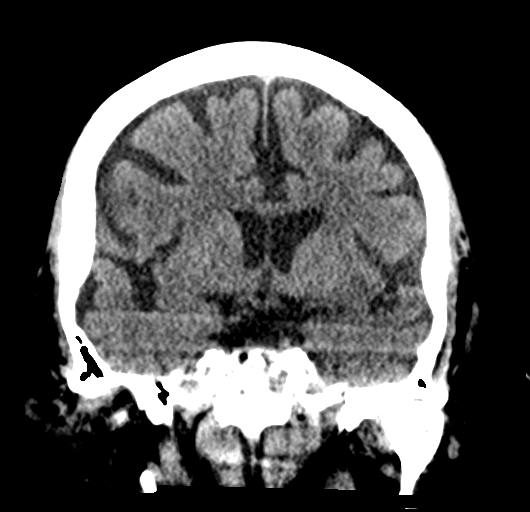

[Series 6: sagittal soft tissue · sagittal · 0.35mm/px · 3 of 62 slices shown]
[im 21/62  brain]
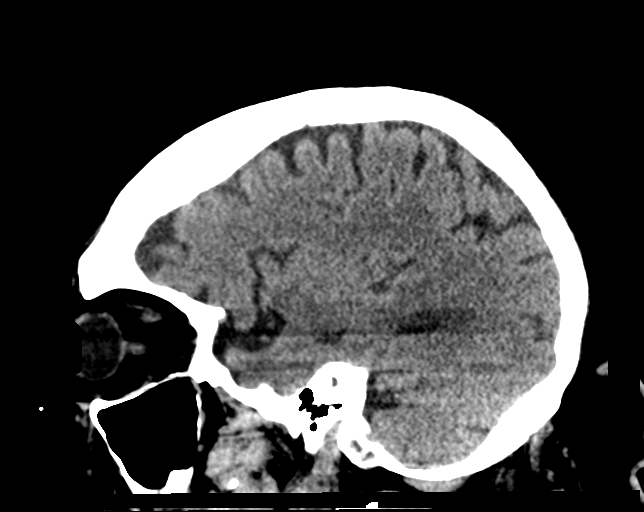
[im 31/62  brain]
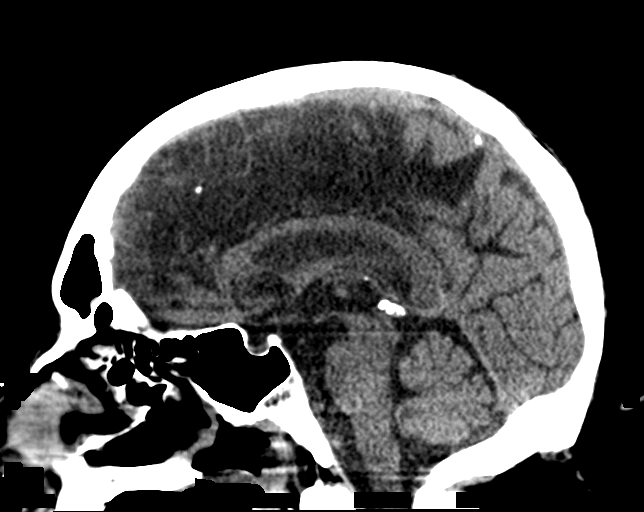
[im 41/62  brain]
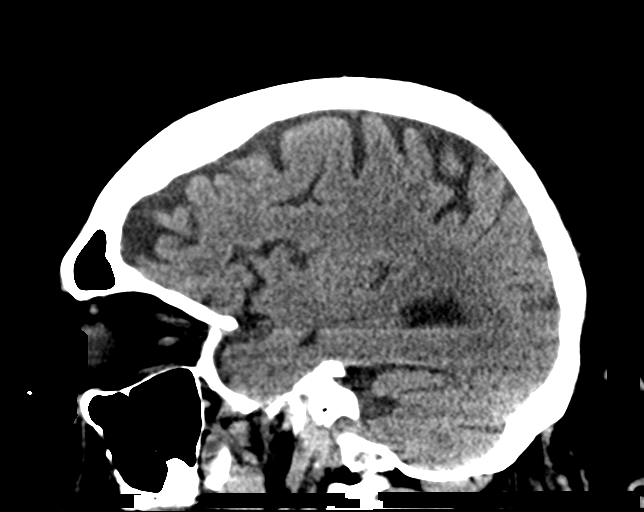

[16 of 47 positions shown; findings below may reference images not displayed]

FINDINGS: Brain: No evidence of acute infarction, hemorrhage, hydrocephalus,
extra-axial collection or mass lesion/mass effect. Symmetric
prominence of the ventricles, cisterns and sulci compatible with
parenchymal volume loss. Patchy areas of white matter
hypoattenuation are most compatible with chronic microvascular
angiopathy.

Vascular: Atherosclerotic calcification of the carotid siphons and
intradural vertebral arteries. No hyperdense vessel.

Skull: No calvarial fracture or suspicious osseous lesion. No scalp
swelling or hematoma.

Sinuses/Orbits: Scattered mural thickening in the ethmoids. No
air-fluid levels. Remaining paranasal sinuses are predominantly
clear. Left mastoid effusion. No right mastoid effusion. Included
orbital structures are unremarkable aside from right lens
extraction.

Other: None
IMPRESSION: No acute intracranial abnormality.

Stable moderate parenchymal volume loss and chronic white matter
angiopathy.

Intracranial atherosclerosis.

Left mastoid effusion.

## 2020-04-17 IMAGING — DX DG CHEST 1V PORT
1 series · 2 of 2 positions shown · non-contrast
Comparison: CTA 09/13/2019

CLINICAL DATA: Shortness of breath began 5 hours prior, sharp
pressure, pain radiating to chest

EXAM:
PORTABLE CHEST 1 VIEW

[Series 1: chest ap · 0.14mm/px · 2 of 2 slices shown]
[im 1/2]
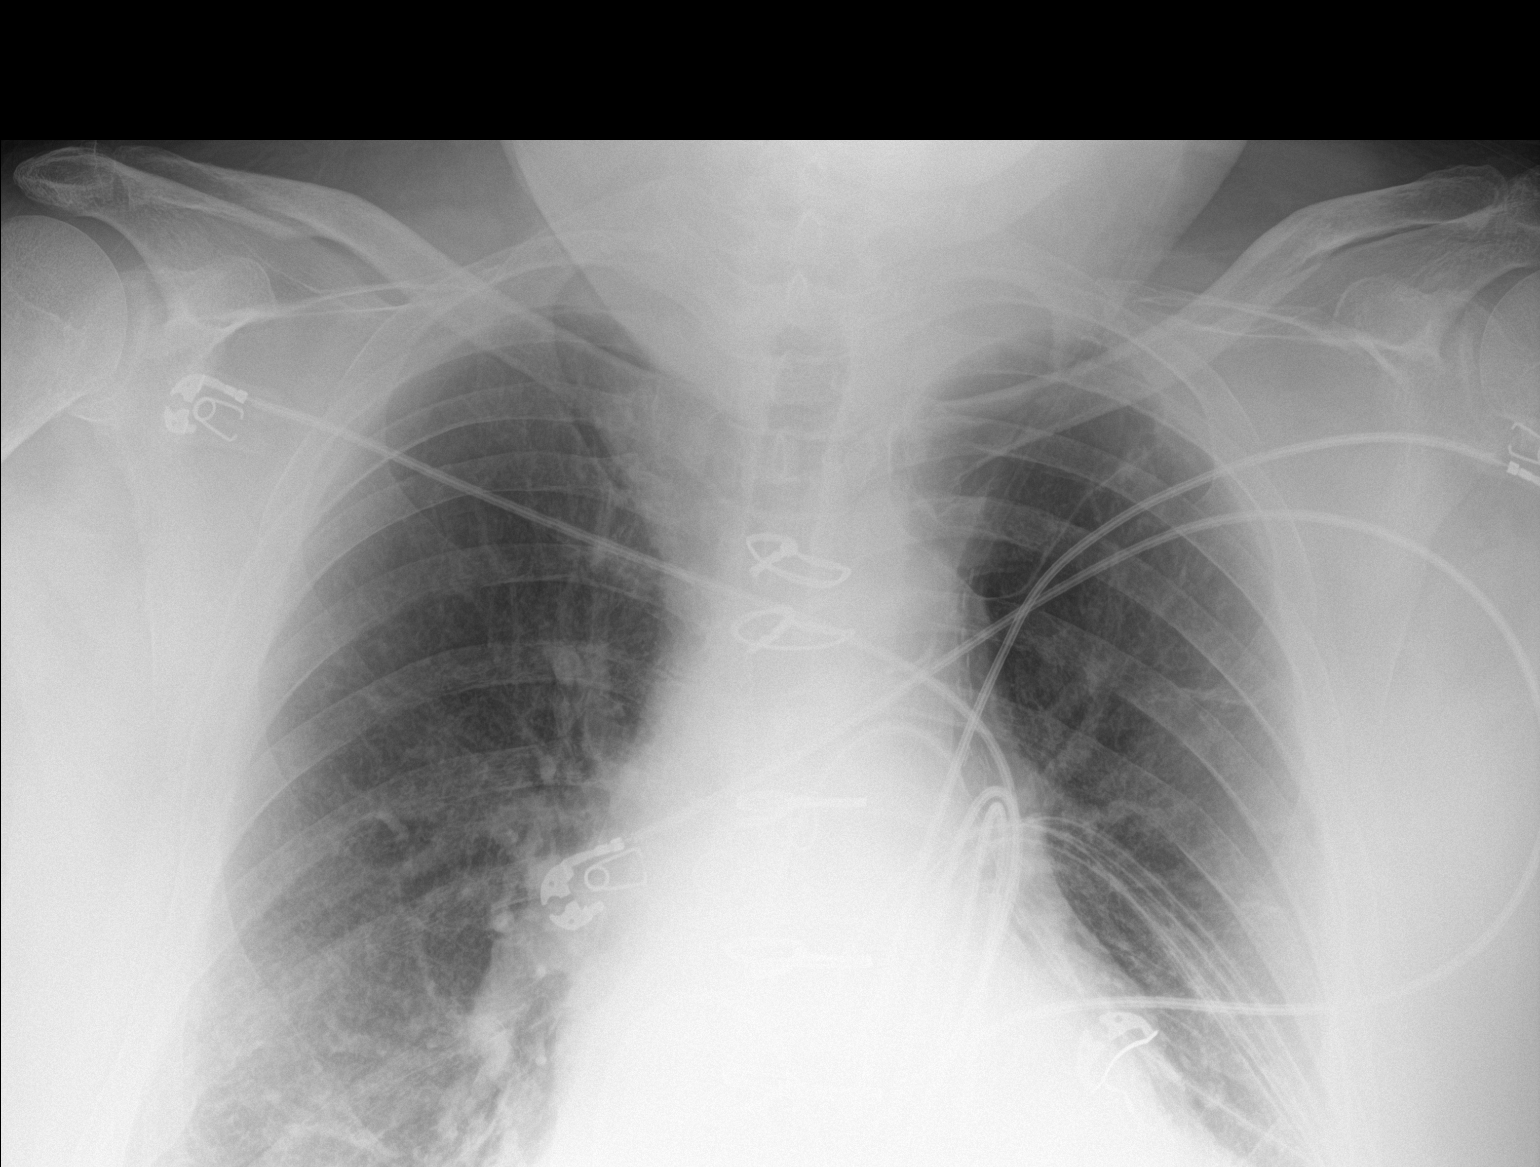
[im 2/2]
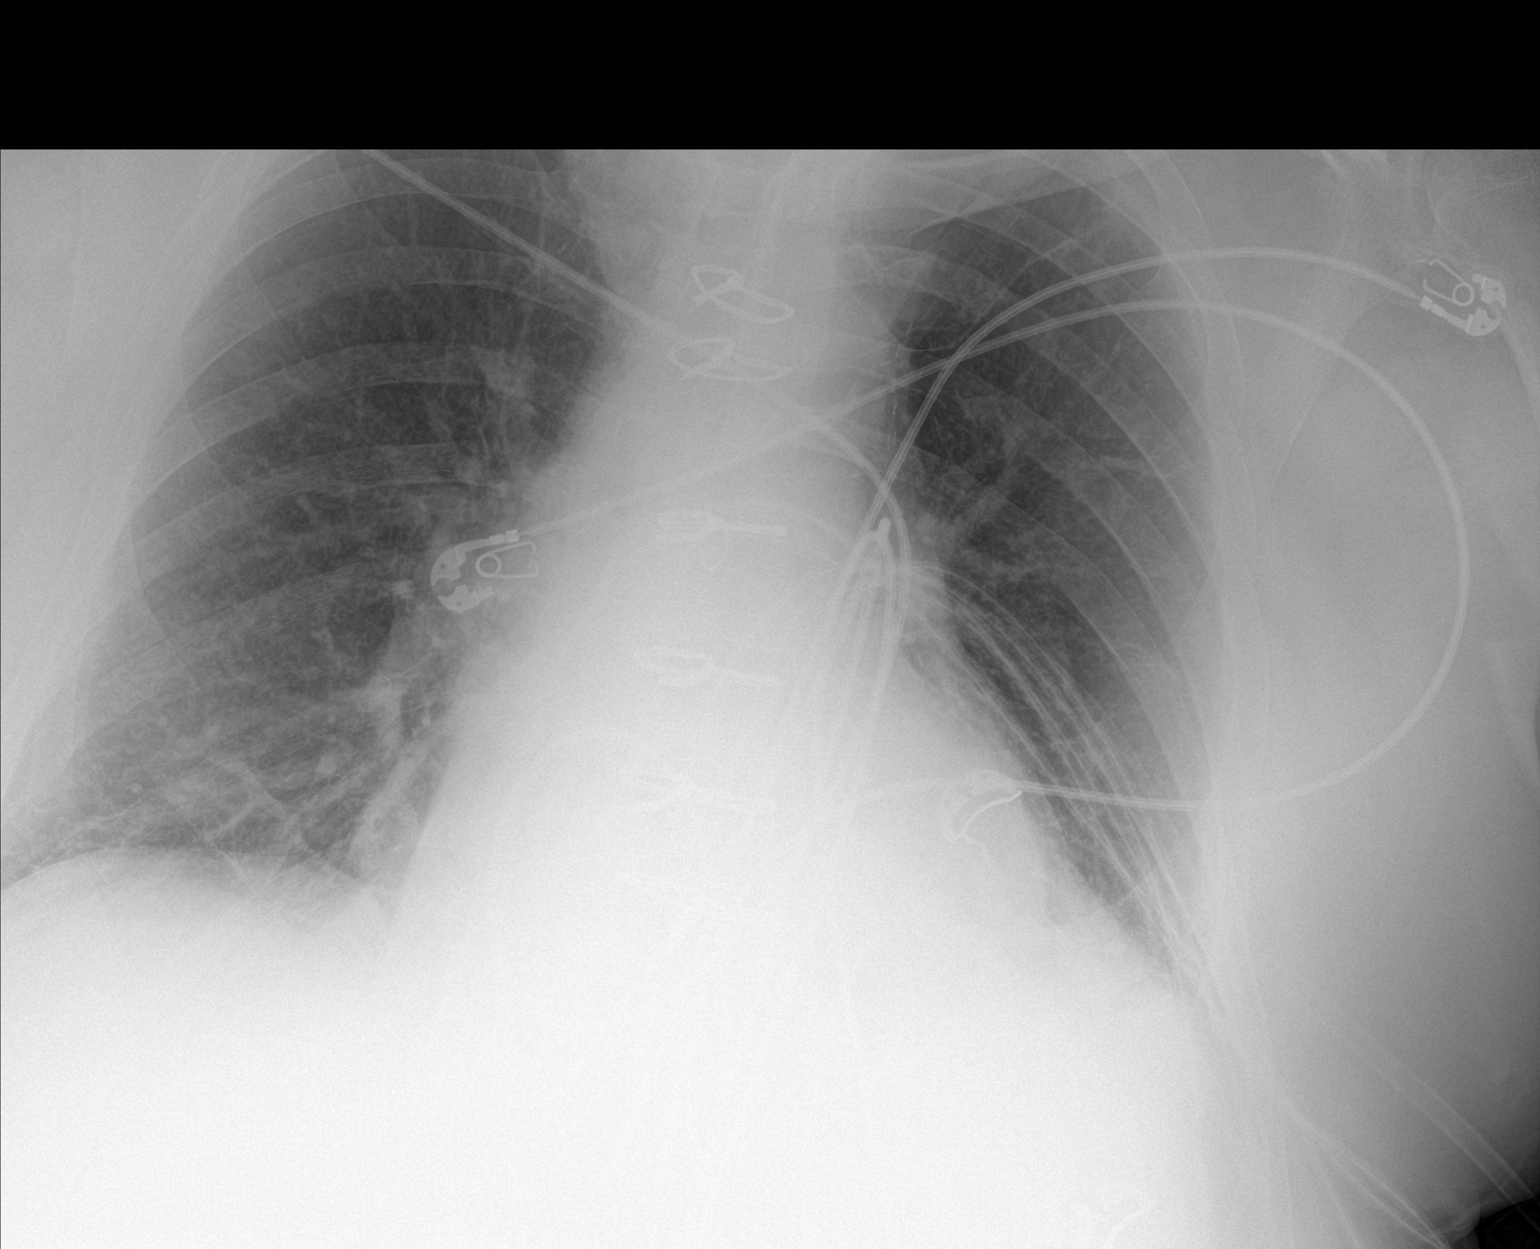

[2 of 2 positions shown; findings below may reference images not displayed]

FINDINGS: Emphysematous changes and architectural distortion in the lung
bases, similar to comparison imaging from 1 day prior. Some bandlike
areas of opacity are compatible with additional atelectasis. No
pneumothorax. No visible effusion. Cardiomegaly and a large hiatal
hernia are unchanged from comparison. Postsurgical changes related
to prior CABG including intact and aligned sternotomy wires and
multiple surgical clips projecting over the mediastinum. No acute
osseous or soft tissue abnormality.
IMPRESSION: 1. Basilar atelectasis on a background of emphysema and scarring,
not significantly changed from 1 day prior
2. Stable cardiomegaly with large hiatal hernia.

## 2020-04-24 IMAGING — DX DG CHEST 1V PORT
1 series · 1 of 1 positions shown · non-contrast
Comparison: None.

CLINICAL DATA: Patient came by [REDACTED] from home. Patient called EMS
for weakness. Patient sugar was 54. Patient does not keep track of
harder to arouse and became lethargic. PT HX: e.*comment was
truncated*sobetrc

EXAM:
PORTABLE CHEST 1 VIEW

[chest ap]
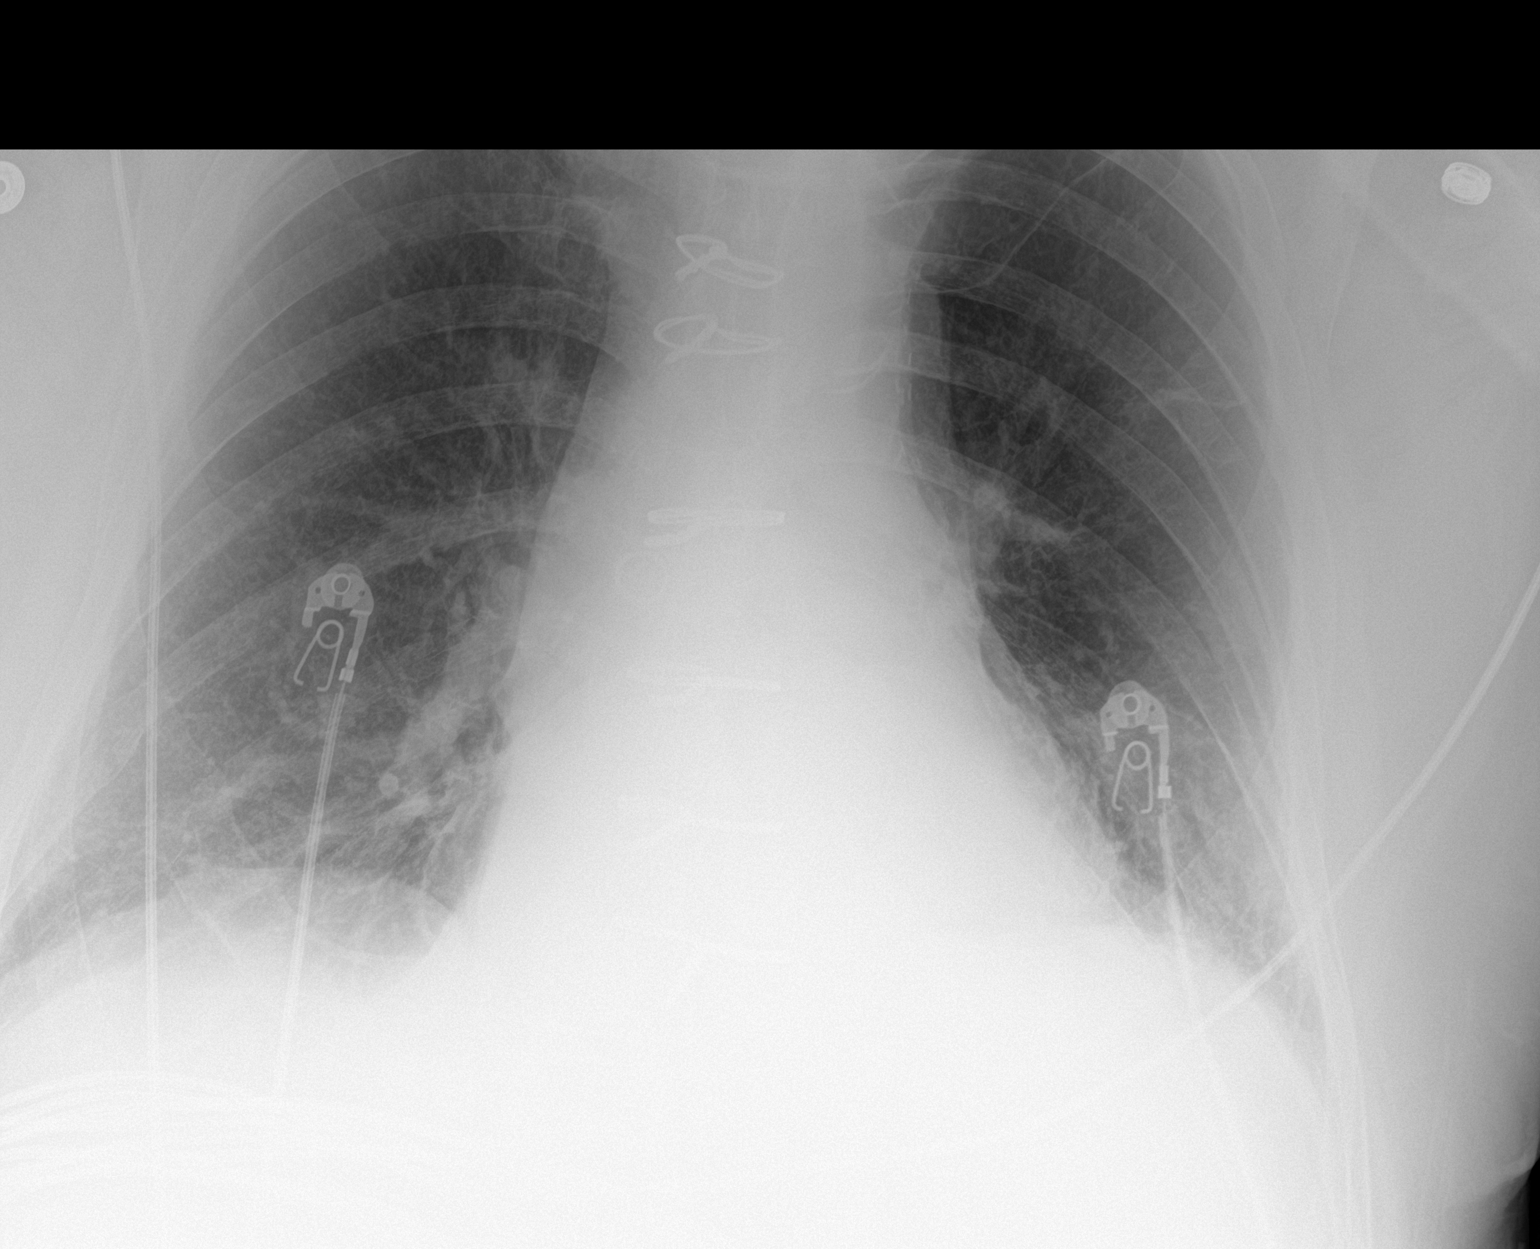

[1 of 1 positions shown; findings below may reference images not displayed]

FINDINGS: Sternotomy wires overlie normal cardiac silhouette. There is mild
bibasilar atelectasis. No effusion infiltrate identified. No
pneumothorax.
IMPRESSION: 1. No acute cardiopulmonary process.
2. Bibasilar atelectasis.

## 2020-05-09 IMAGING — CR DG CHEST 2V
2 series · 2 of 2 positions shown · non-contrast
Comparison: Radiograph 09/21/2019. CT 09/13/2019

CLINICAL DATA: Shortness of breath.

EXAM:
CHEST - 2 VIEW

[w chest lat]
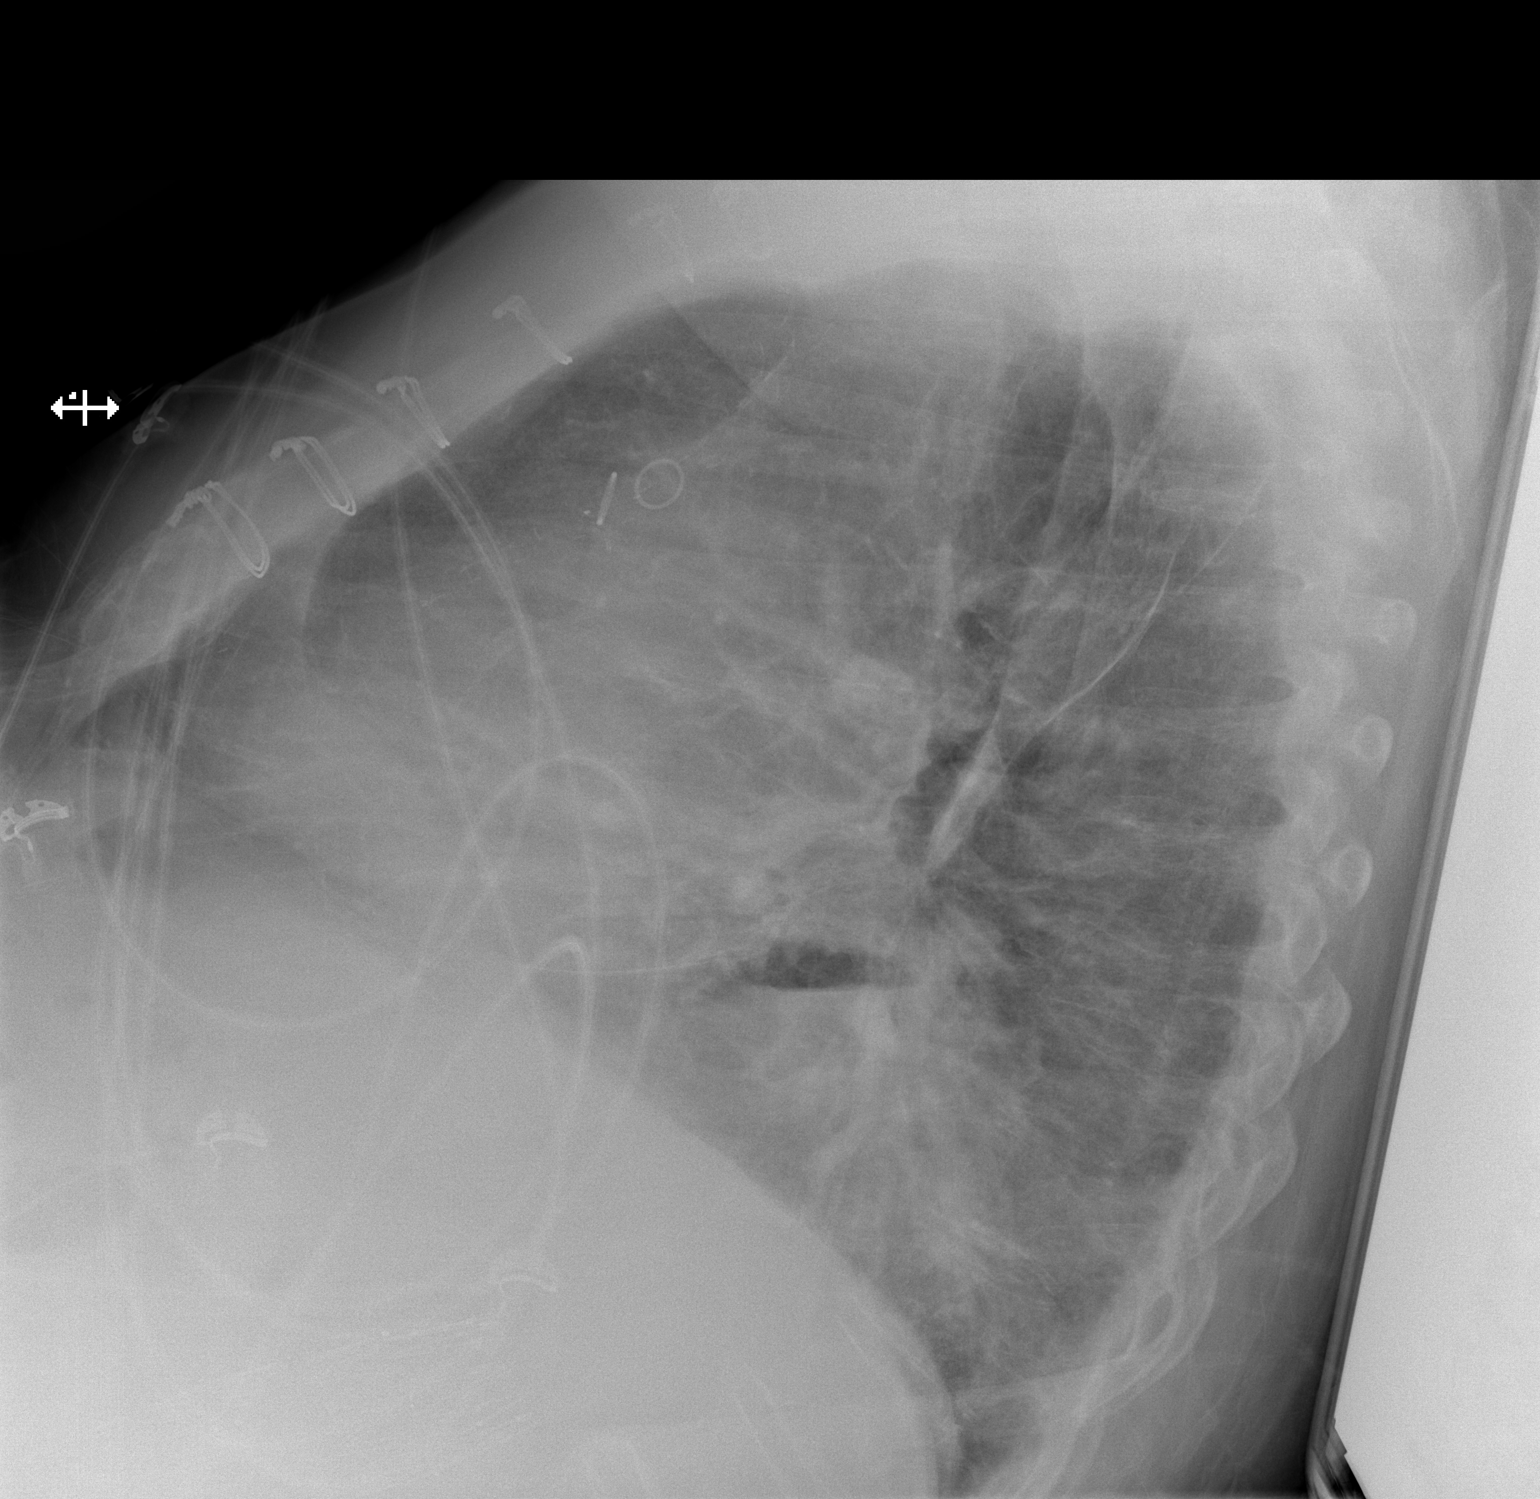

[x chest ap]
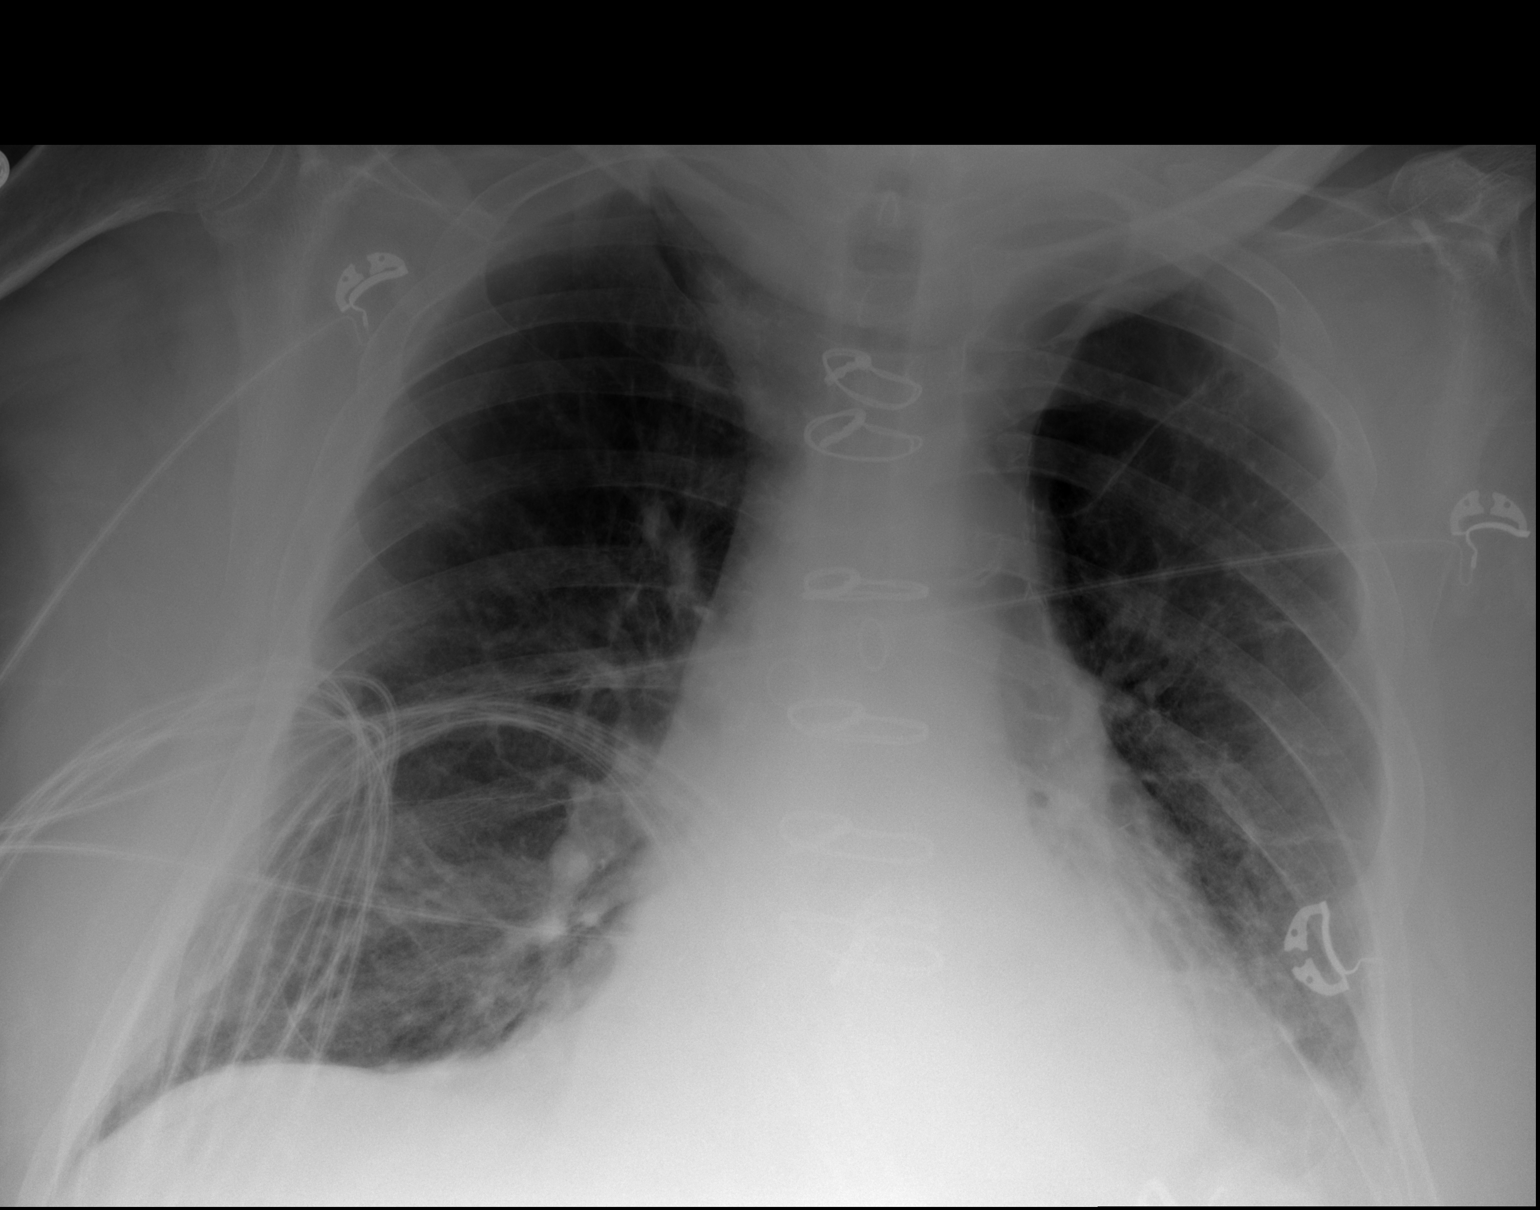

[2 of 2 positions shown; findings below may reference images not displayed]

FINDINGS: Post median sternotomy. Unchanged cardiomegaly. Retrocardiac hiatal
hernia. Chronic hyperinflation with mild bronchial thickening. Left
upper lobe bulla is unchanged from prior exam. No acute airspace
disease, pneumothorax or pleural effusion. Bones are under
mineralized, spine not well assessed on the lateral view due to
habitus.
IMPRESSION: 1. Stable cardiomegaly post CABG.  Retrocardiac hiatal hernia.
2. Chronic hyperinflation and bronchial thickening.

## 2020-06-04 ENCOUNTER — Ambulatory Visit: Payer: Medicare HMO | Admitting: Podiatry

## 2020-06-05 ENCOUNTER — Observation Stay
Admission: EM | Admit: 2020-06-05 | Discharge: 2020-06-09 | Disposition: A | Discharge status: Home / Self Care | Payer: Medicare HMO | Attending: Internal Medicine | Admitting: Internal Medicine

## 2020-06-05 ENCOUNTER — Emergency Department: Payer: Medicare HMO

## 2020-06-05 ENCOUNTER — Encounter: Payer: Self-pay | Admitting: Emergency Medicine

## 2020-06-05 DIAGNOSIS — T25321A Burn of third degree of right foot, initial encounter: Principal | ICD-10-CM | POA: Insufficient documentation

## 2020-06-05 DIAGNOSIS — J9611 Chronic respiratory failure with hypoxia: Secondary | ICD-10-CM | POA: Diagnosis present

## 2020-06-05 DIAGNOSIS — I130 Hypertensive heart and chronic kidney disease with heart failure and stage 1 through stage 4 chronic kidney disease, or unspecified chronic kidney disease: Secondary | ICD-10-CM | POA: Diagnosis not present

## 2020-06-05 DIAGNOSIS — I5032 Chronic diastolic (congestive) heart failure: Secondary | ICD-10-CM | POA: Diagnosis present

## 2020-06-05 DIAGNOSIS — J449 Chronic obstructive pulmonary disease, unspecified: Secondary | ICD-10-CM | POA: Diagnosis present

## 2020-06-05 DIAGNOSIS — D649 Anemia, unspecified: Secondary | ICD-10-CM | POA: Diagnosis present

## 2020-06-05 DIAGNOSIS — Y9389 Activity, other specified: Secondary | ICD-10-CM | POA: Diagnosis not present

## 2020-06-05 DIAGNOSIS — I10 Essential (primary) hypertension: Secondary | ICD-10-CM | POA: Diagnosis present

## 2020-06-05 DIAGNOSIS — N189 Chronic kidney disease, unspecified: Secondary | ICD-10-CM | POA: Insufficient documentation

## 2020-06-05 DIAGNOSIS — R197 Diarrhea, unspecified: Secondary | ICD-10-CM

## 2020-06-05 DIAGNOSIS — E119 Type 2 diabetes mellitus without complications: Secondary | ICD-10-CM

## 2020-06-05 DIAGNOSIS — D500 Iron deficiency anemia secondary to blood loss (chronic): Secondary | ICD-10-CM

## 2020-06-05 DIAGNOSIS — Z20822 Contact with and (suspected) exposure to covid-19: Secondary | ICD-10-CM | POA: Diagnosis not present

## 2020-06-05 DIAGNOSIS — R200 Anesthesia of skin: Secondary | ICD-10-CM

## 2020-06-05 DIAGNOSIS — Y999 Unspecified external cause status: Secondary | ICD-10-CM | POA: Insufficient documentation

## 2020-06-05 DIAGNOSIS — R109 Unspecified abdominal pain: Secondary | ICD-10-CM | POA: Diagnosis present

## 2020-06-05 DIAGNOSIS — R1084 Generalized abdominal pain: Secondary | ICD-10-CM | POA: Diagnosis not present

## 2020-06-05 DIAGNOSIS — Z8719 Personal history of other diseases of the digestive system: Secondary | ICD-10-CM

## 2020-06-05 DIAGNOSIS — G4733 Obstructive sleep apnea (adult) (pediatric): Secondary | ICD-10-CM

## 2020-06-05 DIAGNOSIS — I2510 Atherosclerotic heart disease of native coronary artery without angina pectoris: Secondary | ICD-10-CM | POA: Diagnosis not present

## 2020-06-05 DIAGNOSIS — Y9289 Other specified places as the place of occurrence of the external cause: Secondary | ICD-10-CM | POA: Insufficient documentation

## 2020-06-05 DIAGNOSIS — Z79899 Other long term (current) drug therapy: Secondary | ICD-10-CM | POA: Insufficient documentation

## 2020-06-05 DIAGNOSIS — X19XXXA Contact with other heat and hot substances, initial encounter: Secondary | ICD-10-CM | POA: Insufficient documentation

## 2020-06-05 DIAGNOSIS — R202 Paresthesia of skin: Secondary | ICD-10-CM | POA: Diagnosis not present

## 2020-06-05 DIAGNOSIS — S99921A Unspecified injury of right foot, initial encounter: Secondary | ICD-10-CM | POA: Diagnosis present

## 2020-06-05 DIAGNOSIS — Z87891 Personal history of nicotine dependence: Secondary | ICD-10-CM | POA: Insufficient documentation

## 2020-06-05 DIAGNOSIS — S91301A Unspecified open wound, right foot, initial encounter: Secondary | ICD-10-CM

## 2020-06-05 DIAGNOSIS — Z5309 Procedure and treatment not carried out because of other contraindication: Secondary | ICD-10-CM

## 2020-06-05 LAB — CBC
HCT: 26.7 % — ABNORMAL LOW (ref 39.0–52.0)
HCT: 25.6 % — ABNORMAL LOW (ref 39.0–52.0)
Hemoglobin: 7.7 g/dL — ABNORMAL LOW (ref 13.0–17.0)
Hemoglobin: 7.1 g/dL — ABNORMAL LOW (ref 13.0–17.0)
MCH: 25.2 pg — ABNORMAL LOW (ref 26.0–34.0)
MCH: 25.1 pg — ABNORMAL LOW (ref 26.0–34.0)
MCHC: 28.8 g/dL — ABNORMAL LOW (ref 30.0–36.0)
MCHC: 27.7 g/dL — ABNORMAL LOW (ref 30.0–36.0)
MCV: 87.3 fL (ref 80.0–100.0)
MCV: 90.5 fL (ref 80.0–100.0)
Platelets: 210 10*3/uL (ref 150–400)
Platelets: 193 10*3/uL (ref 150–400)
RBC: 3.06 MIL/uL — ABNORMAL LOW (ref 4.22–5.81)
RBC: 2.83 MIL/uL — ABNORMAL LOW (ref 4.22–5.81)
RDW: 15.9 % — ABNORMAL HIGH (ref 11.5–15.5)
RDW: 16 % — ABNORMAL HIGH (ref 11.5–15.5)
WBC: 8.1 10*3/uL (ref 4.0–10.5)
WBC: 7.7 10*3/uL (ref 4.0–10.5)
nRBC: 0 % (ref 0.0–0.2)
nRBC: 0.3 % — ABNORMAL HIGH (ref 0.0–0.2)

## 2020-06-05 LAB — COMPREHENSIVE METABOLIC PANEL
ALT: 8 U/L (ref 0–44)
AST: 10 U/L — ABNORMAL LOW (ref 15–41)
Albumin: 3.6 g/dL (ref 3.5–5.0)
Alkaline Phosphatase: 110 U/L (ref 38–126)
Anion gap: 7 (ref 5–15)
BUN: 20 mg/dL (ref 8–23)
CO2: 33 mmol/L — ABNORMAL HIGH (ref 22–32)
Calcium: 8.3 mg/dL — ABNORMAL LOW (ref 8.9–10.3)
Chloride: 99 mmol/L (ref 98–111)
Creatinine, Ser: 1.14 mg/dL (ref 0.61–1.24)
GFR calc Af Amer: >60 mL/min (ref >60)
GFR calc non Af Amer: >60 mL/min (ref >60)
Glucose, Bld: 95 mg/dL (ref 70–99)
Potassium: 5.1 mmol/L (ref 3.5–5.1)
Sodium: 139 mmol/L (ref 135–145)
Total Bilirubin: 0.6 mg/dL (ref 0.3–1.2)
Total Protein: 7.1 g/dL (ref 6.5–8.1)

## 2020-06-05 LAB — LIPASE, BLOOD: Lipase: 25 U/L (ref 11–51)

## 2020-06-05 LAB — GLUCOSE, CAPILLARY: Glucose-Capillary: 96 mg/dL (ref 70–99)

## 2020-06-05 MED ORDER — ONDANSETRON 4 MG PO TBDP
4.0000 mg | ORAL_TABLET | Freq: Once | ORAL | Status: AC | PRN
  Administered 2020-06-05: 4 mg via ORAL
  Filled 2020-06-05: qty 1

## 2020-06-05 MED ORDER — SODIUM CHLORIDE 0.9 % IV SOLN
80.0000 mg | Freq: Once | INTRAVENOUS | Status: AC
  Administered 2020-06-06: 80 mg via INTRAVENOUS
  Filled 2020-06-05: qty 80

## 2020-06-05 MED ORDER — SODIUM CHLORIDE 0.9 % IV SOLN
10.0000 mL/h | Freq: Once | INTRAVENOUS | Status: AC
  Administered 2020-06-06: 10 mL/h via INTRAVENOUS

## 2020-06-05 NOTE — ED Notes (Addendum)
RN attempted to get 20G to the left [facility]. RN was able to get blood but was unable to obtain the IV access. Catheter intact.
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
## 2020-06-05 NOTE — ED Notes (Signed)
Provider notified that we are unable to get IV access at this time and that IV team consult has been placed.
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
## 2020-06-05 NOTE — ED Notes (Signed)
Patient complains of nausea.  Has spit into the vomit bag, but no vomit.  I will offer some zofran.

## 2020-06-06 ENCOUNTER — Observation Stay: Payer: Medicare HMO

## 2020-06-06 DIAGNOSIS — S91301A Unspecified open wound, right foot, initial encounter: Secondary | ICD-10-CM

## 2020-06-06 DIAGNOSIS — R197 Diarrhea, unspecified: Secondary | ICD-10-CM

## 2020-06-06 DIAGNOSIS — Z8719 Personal history of other diseases of the digestive system: Secondary | ICD-10-CM

## 2020-06-06 DIAGNOSIS — D649 Anemia, unspecified: Secondary | ICD-10-CM

## 2020-06-06 DIAGNOSIS — D500 Iron deficiency anemia secondary to blood loss (chronic): Secondary | ICD-10-CM

## 2020-06-06 LAB — GLUCOSE, CAPILLARY
Glucose-Capillary: 104 mg/dL — ABNORMAL HIGH (ref 70–99)
Glucose-Capillary: 105 mg/dL — ABNORMAL HIGH (ref 70–99)
Glucose-Capillary: 101 mg/dL — ABNORMAL HIGH (ref 70–99)
Glucose-Capillary: 99 mg/dL (ref 70–99)

## 2020-06-06 LAB — CBC
HCT: 27.3 % — ABNORMAL LOW (ref 39.0–52.0)
Hemoglobin: 7.7 g/dL — ABNORMAL LOW (ref 13.0–17.0)
MCH: 25 pg — ABNORMAL LOW (ref 26.0–34.0)
MCHC: 28.2 g/dL — ABNORMAL LOW (ref 30.0–36.0)
MCV: 88.6 fL (ref 80.0–100.0)
Platelets: 185 10*3/uL (ref 150–400)
RBC: 3.08 MIL/uL — ABNORMAL LOW (ref 4.22–5.81)
RDW: 16.9 % — ABNORMAL HIGH (ref 11.5–15.5)
WBC: 6.9 10*3/uL (ref 4.0–10.5)
nRBC: 0.3 % — ABNORMAL HIGH (ref 0.0–0.2)

## 2020-06-06 LAB — HEPATIC FUNCTION PANEL
ALT: 7 U/L (ref 0–44)
AST: 11 U/L — ABNORMAL LOW (ref 15–41)
Albumin: 3.4 g/dL — ABNORMAL LOW (ref 3.5–5.0)
Alkaline Phosphatase: 101 U/L (ref 38–126)
Bilirubin, Direct: 0.1 mg/dL (ref 0.0–0.2)
Indirect Bilirubin: 0.7 mg/dL (ref 0.3–0.9)
Total Bilirubin: 0.8 mg/dL (ref 0.3–1.2)
Total Protein: 6.7 g/dL (ref 6.5–8.1)

## 2020-06-06 LAB — URINALYSIS, COMPLETE (UACMP) WITH MICROSCOPIC
Bacteria, UA: NONE SEEN
Bilirubin Urine: NEGATIVE
Glucose, UA: NEGATIVE mg/dL
Ketones, ur: NEGATIVE mg/dL
Nitrite: NEGATIVE
Protein, ur: 100 mg/dL — AB
Specific Gravity, Urine: 1.016 (ref 1.005–1.030)
pH: 5 (ref 5.0–8.0)

## 2020-06-06 LAB — HEMOGLOBIN AND HEMATOCRIT, BLOOD
HCT: 26 % — ABNORMAL LOW (ref 39.0–52.0)
HCT: 24.6 % — ABNORMAL LOW (ref 39.0–52.0)
Hemoglobin: 7.7 g/dL — ABNORMAL LOW (ref 13.0–17.0)
Hemoglobin: 7 g/dL — ABNORMAL LOW (ref 13.0–17.0)

## 2020-06-06 LAB — IRON AND TIBC
Iron: 26 ug/dL — ABNORMAL LOW (ref 45–182)
Saturation Ratios: 7 % — ABNORMAL LOW (ref 17.9–39.5)
TIBC: 399 ug/dL (ref 250–450)
UIBC: 373 ug/dL

## 2020-06-06 LAB — HEMOGLOBIN A1C
Hgb A1c MFr Bld: 6.1 % — ABNORMAL HIGH (ref 4.8–5.6)
Mean Plasma Glucose: 128.37 mg/dL

## 2020-06-06 LAB — BASIC METABOLIC PANEL
Anion gap: 9 (ref 5–15)
BUN: 19 mg/dL (ref 8–23)
CO2: 32 mmol/L (ref 22–32)
Calcium: 8.5 mg/dL — ABNORMAL LOW (ref 8.9–10.3)
Chloride: 98 mmol/L (ref 98–111)
Creatinine, Ser: 1.23 mg/dL (ref 0.61–1.24)
GFR calc Af Amer: >60 mL/min (ref >60)
GFR calc non Af Amer: 56 mL/min — ABNORMAL LOW (ref >60)
Glucose, Bld: 114 mg/dL — ABNORMAL HIGH (ref 70–99)
Potassium: 5.2 mmol/L — ABNORMAL HIGH (ref 3.5–5.1)
Sodium: 139 mmol/L (ref 135–145)

## 2020-06-06 LAB — PREPARE RBC (CROSSMATCH)

## 2020-06-06 LAB — SARS CORONAVIRUS 2 BY RT PCR (HOSPITAL ORDER, PERFORMED IN ~~LOC~~ HOSPITAL LAB): SARS Coronavirus 2: NEGATIVE

## 2020-06-06 LAB — FERRITIN: Ferritin: 13 ng/mL — ABNORMAL LOW (ref 24–336)

## 2020-06-06 LAB — AMMONIA: Ammonia: 21 umol/L (ref 9–35)

## 2020-06-06 MED ORDER — MORPHINE SULFATE (PF) 2 MG/ML IV SOLN: 2.0000 mg | INTRAVENOUS | Status: DC | PRN

## 2020-06-06 MED ORDER — ONDANSETRON HCL 4 MG PO TABS: 4.0000 mg | ORAL_TABLET | Freq: Four times a day (QID) | ORAL | Status: DC | PRN

## 2020-06-06 MED ORDER — SODIUM CHLORIDE 0.9 % IV SOLN: INTRAVENOUS | Status: AC

## 2020-06-06 MED ORDER — SODIUM CHLORIDE 0.9 % IV SOLN: INTRAVENOUS | Status: DC

## 2020-06-06 MED ORDER — ACETAMINOPHEN 325 MG PO TABS
650.0000 mg | ORAL_TABLET | Freq: Four times a day (QID) | ORAL | Status: DC | PRN
  Administered 2020-06-06 – 2020-06-08 (×6): 650 mg via ORAL
  Filled 2020-06-06 (×6): qty 2

## 2020-06-06 MED ORDER — QUETIAPINE FUMARATE 25 MG PO TABS
25.0000 mg | ORAL_TABLET | Freq: Once | ORAL | Status: AC
  Administered 2020-06-06: 25 mg via ORAL
  Filled 2020-06-06: qty 1

## 2020-06-06 MED ORDER — PANTOPRAZOLE SODIUM 40 MG IV SOLR
40.0000 mg | Freq: Two times a day (BID) | INTRAVENOUS | Status: DC
  Administered 2020-06-06 – 2020-06-08 (×4): 40 mg via INTRAVENOUS
  Filled 2020-06-06 (×4): qty 40

## 2020-06-06 MED ORDER — HYDROCODONE-ACETAMINOPHEN 5-325 MG PO TABS: 1.0000 | ORAL_TABLET | Freq: Four times a day (QID) | ORAL | Status: DC | PRN

## 2020-06-06 MED ORDER — ACETAMINOPHEN 650 MG RE SUPP: 650.0000 mg | Freq: Four times a day (QID) | RECTAL | Status: DC | PRN

## 2020-06-06 MED ORDER — ONDANSETRON HCL 4 MG/2ML IJ SOLN
4.0000 mg | Freq: Four times a day (QID) | INTRAMUSCULAR | Status: DC | PRN
  Administered 2020-06-07 – 2020-06-09 (×3): 4 mg via INTRAVENOUS
  Filled 2020-06-06 (×3): qty 2

## 2020-06-06 MED ORDER — HYDROCODONE-ACETAMINOPHEN 5-325 MG PO TABS
1.0000 | ORAL_TABLET | ORAL | Status: DC | PRN
  Administered 2020-06-06: 1 via ORAL
  Filled 2020-06-06: qty 1

## 2020-06-06 MED ORDER — INSULIN ASPART 100 UNIT/ML ~~LOC~~ SOLN
0.0000 [IU] | SUBCUTANEOUS | Status: DC
  Administered 2020-06-07 – 2020-06-09 (×2): 2 [IU] via SUBCUTANEOUS
  Filled 2020-06-06 (×2): qty 1
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
## 2020-06-06 NOTE — ED Notes (Signed)
Pt IV keeps beeping, IV redressed, flushes well. New pump placed when new bag started, pt also removed heel protector on right foot, replaced at this time.
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
## 2020-06-06 NOTE — ED Notes (Signed)
Informed Rufina Falco, NP of the patient's repeat H&H of 7.0 which is down from previous collection of 7.7.  Per Rufina Falco, NP will transfuse for hemoglobin less than 7.
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
## 2020-06-06 NOTE — ED Notes (Signed)
Pt remains to call out every 30 minutes.  Heel support boots are on pt.  Requested secretary call and see if chaplain able to visit with pt and possibly comfort him.

## 2020-06-06 NOTE — ED Notes (Signed)
Assisted pt with urinal
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
## 2020-06-06 NOTE — ED Notes (Signed)
Pt hitting call bell; took RN 3 minutes to get in room.  Pt has been pushing call bell every 20-30 minutes since this RN arrived.  Explained to pt that unable to keep coming in the room and cannot just leave another patients room when assisting them.  Pt has brief on and urinal at bedside.  Pt has already spoken to wife.

## 2020-06-06 NOTE — ED Notes (Signed)
Pt wife updated with pt verbal permission.

## 2020-06-06 NOTE — ED Notes (Signed)
Pt asking for pain meds.  Pt keeps yelling in room.  Although oriented difficulty obtaining accurate symptoms.  Messaged dr segal and he will see pt shortly.

## 2020-06-06 NOTE — ED Notes (Signed)
LES EMP at bedside for call bell.

## 2020-06-06 NOTE — ED Notes (Signed)
Pt given phone to speak with wife.

## 2020-06-06 NOTE — ED Notes (Addendum)
mepilex applied to sacrum.  Foam heel protector boots applied to heel. Pt remains to yell out and c/o right arm pain. Tylenol was given. Dr Neysa Bonito is aware.  Pt remains to answer orientation questions correctly but is unable to understand what RN is explaining to him and continues to ask repetitive questions.

## 2020-06-06 NOTE — ED Notes (Signed)
Pt has not had any bowel movements since the start of the 7pm shift.

## 2020-06-06 NOTE — ED Notes (Signed)
Brief changed.  Pt pulled up and repositioned.  New pale purewick placed.
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
## 2020-06-06 NOTE — ED Notes (Addendum)
Cleaned pt up from urine, sheets and pad/brief changed.  Do not feel condom cath will fit. Pt moving and male purewick will stay on.  Urinal at bedside.

## 2020-06-06 NOTE — ED Notes (Signed)
Dr segal at bedside.

## 2020-06-06 NOTE — ED Notes (Signed)
Pt has not had bowel movement for sample

## 2020-06-06 NOTE — ED Notes (Signed)
Discussed with dr segal, GI plans and if will see pt today since he is still NPO or if could do clears.  He reports they ar waiting on stool cultures.  Pt has not had bowel movement.

## 2020-06-06 NOTE — ED Notes (Signed)
Messaged dr segal, head CT ordered. Pt seems to be having difficulty getting thoughts out. When asked him if this is normal he states "no".  Seems oriented but slow to answer and just cannot get thoughts together.

## 2020-06-06 NOTE — ED Notes (Signed)
Heels floated

## 2020-06-06 NOTE — ED Notes (Signed)
Pt wound to right heel cleaned per directions ordered by MD.  Waiting on boots to arrive for heel protection. Have been attempting to float heels in mean time, pt keeps moving legs and pillow.

## 2020-06-06 NOTE — ED Notes (Addendum)
Pt resting eyes closed
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
## 2020-06-06 NOTE — ED Notes (Signed)
Transported to CT 
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
## 2020-06-06 NOTE — ED Notes (Signed)
Chaplain at bedside

## 2020-06-07 ENCOUNTER — Inpatient Hospital Stay: Payer: Medicare HMO

## 2020-06-07 DIAGNOSIS — Z9989 Dependence on other enabling machines and devices: Secondary | ICD-10-CM

## 2020-06-07 DIAGNOSIS — D500 Iron deficiency anemia secondary to blood loss (chronic): Secondary | ICD-10-CM | POA: Diagnosis not present

## 2020-06-07 DIAGNOSIS — G4733 Obstructive sleep apnea (adult) (pediatric): Secondary | ICD-10-CM

## 2020-06-07 DIAGNOSIS — R1084 Generalized abdominal pain: Secondary | ICD-10-CM | POA: Diagnosis not present

## 2020-06-07 DIAGNOSIS — R197 Diarrhea, unspecified: Secondary | ICD-10-CM | POA: Diagnosis not present

## 2020-06-07 DIAGNOSIS — D649 Anemia, unspecified: Secondary | ICD-10-CM | POA: Diagnosis not present

## 2020-06-07 DIAGNOSIS — R202 Paresthesia of skin: Secondary | ICD-10-CM

## 2020-06-07 LAB — COMPREHENSIVE METABOLIC PANEL
ALT: 7 U/L (ref 0–44)
AST: 9 U/L — ABNORMAL LOW (ref 15–41)
Albumin: 3.1 g/dL — ABNORMAL LOW (ref 3.5–5.0)
Alkaline Phosphatase: 85 U/L (ref 38–126)
Anion gap: 10 (ref 5–15)
BUN: 18 mg/dL (ref 8–23)
CO2: 30 mmol/L (ref 22–32)
Calcium: 8.1 mg/dL — ABNORMAL LOW (ref 8.9–10.3)
Chloride: 100 mmol/L (ref 98–111)
Creatinine, Ser: 1.01 mg/dL (ref 0.61–1.24)
GFR calc Af Amer: >60 mL/min (ref >60)
GFR calc non Af Amer: >60 mL/min (ref >60)
Glucose, Bld: 96 mg/dL (ref 70–99)
Potassium: 4.5 mmol/L (ref 3.5–5.1)
Sodium: 140 mmol/L (ref 135–145)
Total Bilirubin: 0.7 mg/dL (ref 0.3–1.2)
Total Protein: 6.2 g/dL — ABNORMAL LOW (ref 6.5–8.1)

## 2020-06-07 LAB — GLUCOSE, CAPILLARY
Glucose-Capillary: 107 mg/dL — ABNORMAL HIGH (ref 70–99)
Glucose-Capillary: 99 mg/dL (ref 70–99)
Glucose-Capillary: 96 mg/dL (ref 70–99)
Glucose-Capillary: 122 mg/dL — ABNORMAL HIGH (ref 70–99)
Glucose-Capillary: 99 mg/dL (ref 70–99)

## 2020-06-07 LAB — CBC
HCT: 24.8 % — ABNORMAL LOW (ref 39.0–52.0)
Hemoglobin: 6.9 g/dL — ABNORMAL LOW (ref 13.0–17.0)
MCH: 25 pg — ABNORMAL LOW (ref 26.0–34.0)
MCHC: 27.8 g/dL — ABNORMAL LOW (ref 30.0–36.0)
MCV: 89.9 fL (ref 80.0–100.0)
Platelets: 157 10*3/uL (ref 150–400)
RBC: 2.76 MIL/uL — ABNORMAL LOW (ref 4.22–5.81)
RDW: 16.5 % — ABNORMAL HIGH (ref 11.5–15.5)
WBC: 5.8 10*3/uL (ref 4.0–10.5)
nRBC: 0.3 % — ABNORMAL HIGH (ref 0.0–0.2)

## 2020-06-07 LAB — HEMOGLOBIN AND HEMATOCRIT, BLOOD
HCT: 29.2 % — ABNORMAL LOW (ref 39.0–52.0)
Hemoglobin: 8.4 g/dL — ABNORMAL LOW (ref 13.0–17.0)

## 2020-06-07 LAB — MAGNESIUM: Magnesium: 2 mg/dL (ref 1.7–2.4)

## 2020-06-07 LAB — PREPARE RBC (CROSSMATCH)

## 2020-06-07 MED ORDER — FUROSEMIDE 10 MG/ML IJ SOLN
20.0000 mg | Freq: Once | INTRAMUSCULAR | Status: AC
  Administered 2020-06-07: 20 mg via INTRAVENOUS
  Filled 2020-06-07: qty 4

## 2020-06-07 MED ORDER — SODIUM CHLORIDE 0.9% IV SOLUTION: Freq: Once | INTRAVENOUS | Status: AC

## 2020-06-07 MED ORDER — TRAMADOL HCL 50 MG PO TABS
50.0000 mg | ORAL_TABLET | Freq: Once | ORAL | Status: AC
  Administered 2020-06-07: 50 mg via ORAL
  Filled 2020-06-07: qty 1

## 2020-06-07 MED ORDER — QUETIAPINE FUMARATE 25 MG PO TABS
25.0000 mg | ORAL_TABLET | Freq: Every evening | ORAL | Status: DC | PRN
  Administered 2020-06-07: 25 mg via ORAL
  Filled 2020-06-07: qty 1

## 2020-06-07 MED ORDER — SODIUM CHLORIDE 0.9 % IV SOLN
300.0000 mg | Freq: Once | INTRAVENOUS | Status: AC
  Administered 2020-06-07: 300 mg via INTRAVENOUS
  Filled 2020-06-07: qty 15

## 2020-06-07 NOTE — Progress Notes (Addendum)
MRI has tried to contact the patient's wife to screen the patient yet they have been unsucessful.  One unit of blood given during day shift.  Wound care provided as ordered.  Stool sample has not been collected as the patient has not had a BM.
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
## 2020-06-08 DIAGNOSIS — R1084 Generalized abdominal pain: Secondary | ICD-10-CM | POA: Diagnosis not present

## 2020-06-08 DIAGNOSIS — S91301A Unspecified open wound, right foot, initial encounter: Secondary | ICD-10-CM | POA: Diagnosis not present

## 2020-06-08 DIAGNOSIS — D649 Anemia, unspecified: Secondary | ICD-10-CM | POA: Diagnosis not present

## 2020-06-08 DIAGNOSIS — R197 Diarrhea, unspecified: Secondary | ICD-10-CM | POA: Diagnosis not present

## 2020-06-08 LAB — GLUCOSE, CAPILLARY
Glucose-Capillary: 101 mg/dL — ABNORMAL HIGH (ref 70–99)
Glucose-Capillary: 105 mg/dL — ABNORMAL HIGH (ref 70–99)
Glucose-Capillary: 112 mg/dL — ABNORMAL HIGH (ref 70–99)
Glucose-Capillary: 114 mg/dL — ABNORMAL HIGH (ref 70–99)
Glucose-Capillary: 116 mg/dL — ABNORMAL HIGH (ref 70–99)
Glucose-Capillary: 118 mg/dL — ABNORMAL HIGH (ref 70–99)
Glucose-Capillary: 94 mg/dL (ref 70–99)

## 2020-06-08 LAB — TYPE AND SCREEN
ABO/RH(D): B NEG
Antibody Screen: NEGATIVE
Unit division: 0
Unit division: 0

## 2020-06-08 LAB — CBC
HCT: 27.2 % — ABNORMAL LOW (ref 39.0–52.0)
Hemoglobin: 8.1 g/dL — ABNORMAL LOW (ref 13.0–17.0)
MCH: 25.7 pg — ABNORMAL LOW (ref 26.0–34.0)
MCHC: 29.8 g/dL — ABNORMAL LOW (ref 30.0–36.0)
MCV: 86.3 fL (ref 80.0–100.0)
Platelets: 176 10*3/uL (ref 150–400)
RBC: 3.15 MIL/uL — ABNORMAL LOW (ref 4.22–5.81)
RDW: 16.1 % — ABNORMAL HIGH (ref 11.5–15.5)
WBC: 5.8 10*3/uL (ref 4.0–10.5)
nRBC: 0.5 % — ABNORMAL HIGH (ref 0.0–0.2)

## 2020-06-08 LAB — BASIC METABOLIC PANEL
Anion gap: 11 (ref 5–15)
BUN: 16 mg/dL (ref 8–23)
CO2: 33 mmol/L — ABNORMAL HIGH (ref 22–32)
Calcium: 8.5 mg/dL — ABNORMAL LOW (ref 8.9–10.3)
Chloride: 94 mmol/L — ABNORMAL LOW (ref 98–111)
Creatinine, Ser: 1.12 mg/dL (ref 0.61–1.24)
GFR calc Af Amer: >60 mL/min (ref >60)
GFR calc non Af Amer: >60 mL/min (ref >60)
Glucose, Bld: 108 mg/dL — ABNORMAL HIGH (ref 70–99)
Potassium: 4.1 mmol/L (ref 3.5–5.1)
Sodium: 138 mmol/L (ref 135–145)

## 2020-06-08 LAB — BPAM RBC
Blood Product Expiration Date: 202109082359
Blood Product Expiration Date: 202109292359
ISSUE DATE / TIME: 202108210229
ISSUE DATE / TIME: 202108221420
Unit Type and Rh: 1700
Unit Type and Rh: 1700

## 2020-06-08 MED ORDER — PANTOPRAZOLE SODIUM 40 MG PO TBEC
40.0000 mg | DELAYED_RELEASE_TABLET | Freq: Two times a day (BID) | ORAL | Status: DC
  Administered 2020-06-08 – 2020-06-09 (×3): 40 mg via ORAL
  Filled 2020-06-08 (×3): qty 1

## 2020-06-08 MED ORDER — BISACODYL 5 MG PO TBEC
10.0000 mg | DELAYED_RELEASE_TABLET | Freq: Every day | ORAL | Status: DC | PRN
  Administered 2020-06-08 – 2020-06-09 (×2): 10 mg via ORAL
  Filled 2020-06-08 (×2): qty 2

## 2020-06-08 MED ORDER — KETOROLAC TROMETHAMINE 15 MG/ML IJ SOLN
15.0000 mg | Freq: Once | INTRAMUSCULAR | Status: AC
  Administered 2020-06-08: 15 mg via INTRAVENOUS
  Filled 2020-06-08: qty 1

## 2020-06-08 MED ORDER — DOCUSATE SODIUM 100 MG PO CAPS
100.0000 mg | ORAL_CAPSULE | Freq: Every day | ORAL | Status: DC
  Administered 2020-06-08 – 2020-06-09 (×2): 100 mg via ORAL
  Filled 2020-06-08 (×2): qty 1

## 2020-06-08 MED ORDER — HYDROCODONE-ACETAMINOPHEN 5-325 MG PO TABS: 1.0000 | ORAL_TABLET | ORAL | Status: DC | PRN
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
## 2020-06-08 NOTE — Progress Notes (Signed)
Vonda Antigua, MD [address], [place], [place], [place], [address] [address], [place], [place], [place], [address] Phone: [phone]  Fax: [phone]   Subjective:  Patient resting in bed.  Has not had any bowel movements yesterday or today.  Patient states abdominal pain is better.  No nausea or vomiting.  Patient states his diarrhea started after taking recent antibiotics  Objective: Exam: Vital signs in last 24 hours: Vitals:   06/07/20 1818 06/07/20 2034 06/07/20 2034 06/08/20 0408  BP: 137/71 138/68  129/68  Pulse: 99 94 98 99  Resp: 19  20 18   Temp: 98.1 F (36.7 C) 98.2 F (36.8 C)  98.3 F (36.8 C)  TempSrc: Oral Oral  Oral  SpO2: 92% (!) 89% 91% 96%  Weight:      Height:       Weight change:   Intake/Output Summary (Last 24 hours) at 06/08/2020 1113 Last data filed at 06/08/2020 2993 Gross per 24 hour  Intake 1319.95 ml  Output 150 ml  Net 1169.95 ml    General: No acute distress, AAO x3 Abd: Soft, NT/ND, No HSM Skin: Warm, no rashes Neck: Supple, Trachea midline   Lab Results: Lab Results  Component Value Date   WBC 5.8 06/08/2020   HGB 8.1 (L) 06/08/2020   HCT 27.2 (L) 06/08/2020   MCV 86.3 06/08/2020   PLT 176 06/08/2020   Micro Results: Recent Results (from the past 240 hour(s))  SARS Coronavirus 2 by RT PCR (hospital order, performed in [facility] hospital lab) Nasopharyngeal Nasopharyngeal Swab     Status: None   Collection Time: 06/06/20  8:36 PM   Specimen: Nasopharyngeal Swab  Result Value Ref Range Status   SARS Coronavirus 2 NEGATIVE NEGATIVE Final    Comment: (NOTE) SARS-CoV-2 target nucleic acids are NOT DETECTED.  The SARS-CoV-2 RNA is generally detectable in upper and lower respiratory specimens during the acute phase of infection. The lowest concentration of SARS-CoV-2 viral copies this assay can detect is 250 copies / mL. A negative result does not preclude SARS-CoV-2 infection and should not be used as  the sole basis for treatment or other patient management decisions.  A negative result may occur with improper specimen collection / handling, submission of specimen other than nasopharyngeal swab, presence of viral mutation(s) within the areas targeted by this assay, and inadequate number of viral copies (<250 copies / mL). A negative result must be combined with clinical observations, patient history, and epidemiological information.  Fact Sheet for Patients:   StrictlyIdeas.no  Fact Sheet for Healthcare Providers: BankingDealers.co.za  This test is not yet approved or  cleared by the [place] FDA and has been authorized for detection and/or diagnosis of SARS-CoV-2 by FDA under an Emergency Use Authorization (EUA).  This EUA will remain in effect (meaning this test can be used) for the duration of the COVID-19 declaration under Section 564(b)(1) of the Act, 21 U.S.C. section 360bbb-3(b)(1), unless the authorization is terminated or revoked sooner.  Performed at [facility], [address] [place], [place] [address]    Studies/Results: MR CERVICAL SPINE WO CONTRAST  Result Date: 06/07/2020 CLINICAL DATA:  Cervical radiculopathy EXAM: MRI CERVICAL SPINE WITHOUT CONTRAST TECHNIQUE: Multiplanar, multisequence MR imaging of the cervical spine was performed. No intravenous contrast was administered. COMPARISON:  None. FINDINGS: Alignment: Reversal of normal cervical lordosis. Vertebrae: Left C3-4 facet edema.  No discitis.  No fracture. Cord: Normal signal and morphology. Posterior Fossa, vertebral arteries, paraspinal tissues: Negative Disc  levels: C1-2: Unremarkable. C2-3: No disc herniation. Mild right facet hypertrophy. There is no spinal canal stenosis. No neural foraminal stenosis. C3-4: Moderate facet hypertrophy. Small bilateral uncovertebral osteophytes. Moderate spinal canal stenosis. Severe bilateral neural foraminal  stenosis. C4-5: Left-greater-than-right uncovertebral osteophytes with right-greater-than-left facet hypertrophy. Mild spinal canal stenosis. Moderate right and severe left neural foraminal stenosis. C5-6: Intermediate disc osteophyte complex. Mild spinal canal stenosis. Mild right and moderate left neural foraminal stenosis. C6-7: Mild uncovertebral spurring. There is no spinal canal stenosis. No neural foraminal stenosis. C7-T1: Normal disc space and facet joints. There is no spinal canal stenosis. No neural foraminal stenosis. IMPRESSION: 1. Left C3-4 facet edema, which may be a source of local neck pain. 2. 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 3. Multilevel moderate-to-severe neural foraminal stenosis, worst at C4 and C5. Electronically Signed   By: Ulyses Jarred M.D.   On: 06/07/2020 23:08   Medications:  Scheduled Meds: . insulin aspart  0-15 Units Subcutaneous Q4H  . pantoprazole (PROTONIX) IV  40 mg Intravenous Q12H   Continuous Infusions: . sodium chloride 100 mL/hr at 06/07/20 1836   PRN Meds:.acetaminophen **OR** acetaminophen, ondansetron **OR** ondansetron (ZOFRAN) IV, QUEtiapine   Assessment: Principal Problem:   Symptomatic anemia Active Problems:   Essential hypertension   COPD (chronic obstructive pulmonary disease) (HCC)   Chronic diastolic CHF (congestive heart failure) (HCC)   CAD (coronary artery disease)   Chronic hypoxemic respiratory failure (HCC)   Abdominal pain   Type 2 diabetes mellitus (HCC)   OSA on CPAP   Acute diarrhea   Non-healing wound of right heel   History of GI bleed   Anemia due to GI blood loss    Plan: Stool studies were not obtained on admission and now patient's diarrhea has resolved and stool studies are unable to be obtained  Given correlation of diarrhea occurring close to his recent antibiotic use, suggest possible antibiotic associated diarrhea that has now resolved  Patient has not had any active GI bleeding and  has had multiple endoscopic procedures previously.  No indication for repeat endoscopy at this time.  Endoscopic procedures at this time would have higher risks than benefits given thorough work-up recently, and patient requiring 6 L O2 via nasal cannula and no active GI bleeding  Hemoglobin stabilized  Patient should follow-up with primary GI as an outpatient shortly after discharge  Iron replacement as necessary  GI service will sign off at this time  Please page with any questions or concerns, signs of active GI bleeding, or worsening hemoglobin.   LOS: 2 days   Vonda Antigua, MD 06/08/2020, 11:13 AM

## 2020-06-09 ENCOUNTER — Other Ambulatory Visit (INDEPENDENT_AMBULATORY_CARE_PROVIDER_SITE_OTHER): Payer: Self-pay | Admitting: Vascular Surgery

## 2020-06-09 DIAGNOSIS — S91301A Unspecified open wound, right foot, initial encounter: Secondary | ICD-10-CM

## 2020-06-09 DIAGNOSIS — R1084 Generalized abdominal pain: Secondary | ICD-10-CM | POA: Diagnosis not present

## 2020-06-09 DIAGNOSIS — R200 Anesthesia of skin: Secondary | ICD-10-CM

## 2020-06-09 DIAGNOSIS — R197 Diarrhea, unspecified: Secondary | ICD-10-CM | POA: Diagnosis not present

## 2020-06-09 DIAGNOSIS — D649 Anemia, unspecified: Secondary | ICD-10-CM | POA: Diagnosis not present

## 2020-06-09 DIAGNOSIS — D500 Iron deficiency anemia secondary to blood loss (chronic): Secondary | ICD-10-CM | POA: Diagnosis not present

## 2020-06-09 LAB — CBC
HCT: 31.6 % — ABNORMAL LOW (ref 39.0–52.0)
Hemoglobin: 9.5 g/dL — ABNORMAL LOW (ref 13.0–17.0)
MCH: 25.7 pg — ABNORMAL LOW (ref 26.0–34.0)
MCHC: 30.1 g/dL (ref 30.0–36.0)
MCV: 85.6 fL (ref 80.0–100.0)
Platelets: 188 10*3/uL (ref 150–400)
RBC: 3.69 MIL/uL — ABNORMAL LOW (ref 4.22–5.81)
RDW: 16 % — ABNORMAL HIGH (ref 11.5–15.5)
WBC: 6.4 10*3/uL (ref 4.0–10.5)
nRBC: 0.3 % — ABNORMAL HIGH (ref 0.0–0.2)

## 2020-06-09 LAB — BASIC METABOLIC PANEL
Anion gap: 11 (ref 5–15)
BUN: 13 mg/dL (ref 8–23)
CO2: 33 mmol/L — ABNORMAL HIGH (ref 22–32)
Calcium: 8.7 mg/dL — ABNORMAL LOW (ref 8.9–10.3)
Chloride: 92 mmol/L — ABNORMAL LOW (ref 98–111)
Creatinine, Ser: 0.96 mg/dL (ref 0.61–1.24)
GFR calc Af Amer: >60 mL/min (ref >60)
GFR calc non Af Amer: >60 mL/min (ref >60)
Glucose, Bld: 107 mg/dL — ABNORMAL HIGH (ref 70–99)
Potassium: 3.7 mmol/L (ref 3.5–5.1)
Sodium: 136 mmol/L (ref 135–145)

## 2020-06-09 LAB — GLUCOSE, CAPILLARY
Glucose-Capillary: 109 mg/dL — ABNORMAL HIGH (ref 70–99)
Glucose-Capillary: 117 mg/dL — ABNORMAL HIGH (ref 70–99)
Glucose-Capillary: 117 mg/dL — ABNORMAL HIGH (ref 70–99)
Glucose-Capillary: 120 mg/dL — ABNORMAL HIGH (ref 70–99)
Glucose-Capillary: 125 mg/dL — ABNORMAL HIGH (ref 70–99)

## 2020-06-09 MED ORDER — POLYETHYLENE GLYCOL 3350 17 G PO PACK
17.0000 g | PACK | Freq: Every day | ORAL | Status: DC
  Administered 2020-06-09: 17 g via ORAL
  Filled 2020-06-09: qty 1

## 2020-06-09 MED ORDER — SODIUM CHLORIDE 0.9 % IV SOLN: INTRAVENOUS | Status: DC

## 2020-06-09 MED ORDER — INSULIN ASPART 100 UNIT/ML ~~LOC~~ SOLN: 0.0000 [IU] | Freq: Three times a day (TID) | SUBCUTANEOUS | Status: DC

## 2020-06-09 MED ORDER — FERROUS SULFATE 325 (65 FE) MG PO TABS: 325.0000 mg | ORAL_TABLET | Freq: Every day | ORAL | 3 refills | Status: AC

## 2020-06-09 NOTE — Progress Notes (Signed)
Order received from Dr Neysa Bonito to change CBG from every 4 hours to [facility] and HS

## 2020-06-09 NOTE — Care Management Important Message (Signed)
Important Message  Patient Details  Name: [name] MRN: [id number] Date of Birth: October 28, 1940   Medicare Important Message Given:  Yes     Dannette Barbara 06/09/2020, 11:30 AM
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
## 2020-06-09 NOTE — Care Management CC44 (Signed)
Condition Code 44 Documentation Completed  Patient Details  Name: [name] MRN: [id number] Date of Birth: 1941/05/25   Condition Code 44 given:  Yes Patient signature on Condition Code 44 notice:  Yes Documentation of 2 MD's agreement:  Yes Code 44 added to claim:  Yes    Beverly Sessions, RN 06/09/2020, 3:43 PM
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
## 2020-06-09 NOTE — Consult Note (Signed)
Neurosurgery-New Consultation Evaluation 06/09/2020 [name] [id number]  Identifying Statement: [name] is a 79 y.o. male from [place] 61-9* with hand numbness  Physician Requesting Consultation: Marva Panda, MD  History of Present Illness: [name] is here for evaluation of multiple issues including concern for vascular flow in his lower extremities.  He does have a complicated medical history including COPD requiring 6 L of oxygen, coronary artery disease and congestive heart failure, diabetic neuropathy, AAA status post endovascular repair and peripheral vascular disease.  He states that when he was presenting in the emergency department, he did go without oxygen on multiple occasions which has led to some new symptoms.  He is having some trouble with his speech but also the concern that new numbness is developed in his hands going up into his forearms.  He does have a chronic history of lower extremity numbness due to his polyneuropathy but he does state the hands are new.  He does not note any obvious weakness.  He does endorsed some mild neck pain  There is concern for decreased blood flow at this time in the lower extremities and he is being evaluated for further testing.  He does have a burn on his left foot that he says is also very painful.  Given the new symptoms in the hands, a MRI of the cervical spine was obtained.  We are asked to evaluate.    Past Medical History:  Past Medical History:  Diagnosis Date  . AAA (abdominal aortic aneurysm) ([place])    a. 12/2008 s/p 7cm, endovascular repair with coiling right hypogastric artery   . Acute Cameron ulcer   . Adenomatous duodenal polyp   . Allergic rhinitis, cause unspecified   . Anxiety   . AVM (arteriovenous malformation) of colon with hemorrhage   . Bipolar 1 disorder, mixed, moderate ([place]) 04/16/2015  . CAD (coronary artery disease)    a. 12/2008 s/p MI and CABG x 4 (LIMA->LAD, VG->RI, VG->D1,  VG->RPDA).  . Chronic diastolic CHF (congestive heart failure) ([place])    a. 04/2015 Echo: EF 55-60%, no rwma, Gr 1 DD, mild AI; b. 04/2019 Echo: EF 55-60%. Mod diast dysfxn. Mild AS/AI.   Marland Kitchen Complication of anesthesia    "if they sedate me for too long, they have to intubate me; then they can't get me to come out of it" (04/03/2017)  . COPD (chronic obstructive pulmonary disease) ([place])    a. GOLD stage IV, started home O2. Severe bullous disease of LUL. Prolonged intubation after surgeries due to COPD.  Marland Kitchen Depression with anxiety 01/14/2013  . Diverticulosis   . Duodenal diverticulum   . Duodenal ulcer   . Emphysema of lung ([place])   . Esophagitis   . Essential hypertension 08/18/2009   Qualifier: Diagnosis of  By: Doy Mince LPN, Megan    . GERD (gastroesophageal reflux disease)   . GI bleed requiring more than 4 units of blood in 24 hours, ICU, or surgery    a. Hx bleeding gastric polyps, cecal & sigmoid AVMS s/p APC 03/30/14  . Hiatal hernia    large  . History of blood transfusion    "many many many; related to blood loss; anemia"  . Hyperlipidemia   . Insomnia 08/10/2014  . Leucocytosis 12/04/2013  . Memory loss   . Morbid obesity ([place])   . Multiple gastric polyps   . Myocardial infarction ([place])    "I think I had a minor one when I had the OHS"  .  On home oxygen therapy    "7 liters [place] w/oxigenator" (04/03/2017)  . Pneumonia 2017  . Recurrent Microcytic Anemia    a. presumed chronic GI blood loss.  . Type II diabetes mellitus ([place])   . Vitamin D deficiency 08/10/2014    Social History: Social History   Socioeconomic History  . Marital status: Married    Spouse name: Not on file  . Number of children: Not on file  . Years of education: Not on file  . Highest education level: Not on file  Occupational History  . Occupation: Retired    Fish farm manager: RETIRED  Tobacco Use  . Smoking status: Former Smoker    Packs/day: 2.00    Years: 50.00    Pack years: 100.00    Types:  Cigarettes    Quit date: 11/18/2008    Years since quitting: 11.5  . Smokeless tobacco: Never Used  Vaping Use  . Vaping Use: Never used  Substance and Sexual Activity  . Alcohol use: No    Alcohol/week: 0.0 standard drinks    Comment: quit in ~ 2010  . Drug use: No    Comment: "I smoked pot in the 1980s"  . Sexual activity: Never  Other Topics Concern  . Not on file  Social History Narrative      Lives at home with his wife   Social Determinants of Health   Financial Resource Strain:   . Difficulty of Paying Living Expenses: Not on file  Food Insecurity:   . Worried About Charity fundraiser in the Last Year: Not on file  . Ran Out of Food in the Last Year: Not on file  Transportation Needs:   . Lack of Transportation (Medical): Not on file  . Lack of Transportation (Non-Medical): Not on file  Physical Activity:   . Days of Exercise per Week: Not on file  . Minutes of Exercise per Session: Not on file  Stress:   . Feeling of Stress : Not on file  Social Connections:   . Frequency of Communication with Friends and Family: Not on file  . Frequency of Social Gatherings with Friends and Family: Not on file  . Attends Religious Services: Not on file  . Active Member of Clubs or Organizations: Not on file  . Attends Archivist Meetings: Not on file  . Marital Status: Not on file  Intimate Partner Violence:   . Fear of Current or Ex-Partner: Not on file  . Emotionally Abused: Not on file  . Physically Abused: Not on file  . Sexually Abused: Not on file     Family History: Family History  Problem Relation Age of Onset  . Emphysema Mother   . Heart disease Mother   . ALS Father   . Diabetes Sister     Review of Systems:  Review of Systems - General ROS: Negative Psychological ROS: Negative Ophthalmic ROS: Negative ENT ROS: Negative Hematological and Lymphatic ROS: Negative  Endocrine ROS: Negative Respiratory ROS: Negative Cardiovascular ROS:  Negative Gastrointestinal ROS: Negative Genito-Urinary ROS: Negative Musculoskeletal ROS: positive for neck pain Neurological ROS: Positive for numbness Dermatological ROS: Negative  Physical Exam: BP (!) 146/75 (BP Location: Right Arm)   Pulse 97   Temp 98.1 F (36.7 C) (Oral)   Resp 20   Ht 6\' 3"  (1.905 m)   Wt 127.9 kg   SpO2 94%   BMI 35.25 kg/m  Body mass index is 35.25 kg/m. Body surface area is 2.6 meters squared.  General appearance: Alert, cooperative, in no acute distress Head: Normocephalic, atraumatic Eyes: Normal, EOM intact Neck: Supple, ROM appears slightly restricted Ext: Edema noted in lower extremities, Cushion boot on RLE  Neurologic exam:  Mental status: alertness: alert, affect: normal Speech: fluent and clear Motor:strength symmetric 5/5 in bilateral deltoids, bicep, triceps, grip, 4+ out of 5 in bilateral interossei.  He appears 5 out of 5 in bilateral hip flexion, knee extension, dorsiflexion. Sensory: There is decreased light touch circumferentially bilateral arms from the elbows distally.  There is also decreased sensation light touch in bilateral lower extremities below the knees Reflexes: 1+ at bilateral biceps, patella, negative Hoffmann's Gait: Not tested  Imaging: MRI cervical spine:1. Left C3-4 facet edema, which may be a source of local neck pain.2. 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 3. Multilevel moderate-to-severe neural foraminal stenosis, worst at C4 and C5.   Impression/Plan:  [name] is seen for evaluation of cervical spinal stenosis found on MRI.  I did review the images and and there is moderate to severe central stenosis in the mid cervical spine multiple levels.  I did speak with him about the new onset of numbness in his bilateral upper extremities.  We discussed that this could be from the stenosis coupled with the recent worsening of his medical conditions.  However, given the longstanding neuropathy and  vascular disease, there is a still strong possibility that the symptoms are from those etiologies.  Overall, he does not have any obvious weakness and therefore I do not recommend any surgical intervention as I do think the risk far outweigh any benefit given his comorbidities and risk of anesthesia.  However, I do think he would benefit from physical therapy evaluation and management of other risk factors.  He will need to be educated on falls avoidance.  We did discuss that this is a chronic condition and may worsen with time but I do think we would have to rely on medical interventions given he is a poor surgical candidate.   1.  Diagnosis: Cervical spinal stenosis  2.  Plan -No surgical intervention recommended -Physical therapy evaluation recommended

## 2020-06-09 NOTE — Progress Notes (Signed)
Patient declined CPAP QHS x2. Currently on 4L [place] tolerating well. RN aware. Will continue to monitor.

## 2020-06-09 NOTE — Progress Notes (Signed)
 Vein & Vascular Surgery Daily Progress Note   Subjective: Patient without complaint this AM with the exception of continued right heel pain and constipation.  No issues overnight.  Objective: Vitals:   06/08/20 0408 06/08/20 1155 06/08/20 2002 06/09/20 0344  BP: 129/68 (!) 149/76 (!) 152/75 (!) 146/75  Pulse: 99 96 93 97  Resp: _0 Temp: 98.3 F (36.8 C) 98 F (36.7 C) 98.2 F (36.8 C) 98.1 F (36.7 C)  TempSrc: Oral Oral Oral Oral  SpO2: 96% 98% 96% 94%  Weight:      Height:        Intake/Output Summary (Last 24 hours) at 06/09/2020 1056 Last data filed at 06/09/2020 0900 Gross per 24 hour  Intake 240 ml  Output --  Net 240 ml   Physical Exam: A&Ox2, NAD, On [place] CV: RRR Pulmonary: CTA Bilaterally, on 6L [place] Abdomen: Soft, Nontender, Nondistended Vascular:  Right lower extremity: Thigh soft.  Calf soft.  Extremities warm distally toes.  Hard to palpate pedal pulses.  Tender to palpation along the heel.  Burn noted to the heel.  Stable wound appearance.   Laboratory: CBC    Component Value Date/Time   WBC 6.4 06/09/2020 0506   HGB 9.5 (L) 06/09/2020 0506   HCT 31.6 (L) 06/09/2020 0506   PLT 188 06/09/2020 0506   BMET    Component Value Date/Time   NA 136 06/09/2020 0506   K 3.7 06/09/2020 0506   CL 92 (L) 06/09/2020 0506   CO2 33 (H) 06/09/2020 0506   GLUCOSE 107 (H) 06/09/2020 0506   BUN 13 06/09/2020 0506   CREATININE 0.96 06/09/2020 0506   CREATININE 1.09 02/13/2015 1329   CALCIUM 8.7 (L) 06/09/2020 0506   GFRNONAA >60 06/09/2020 0506   GFRNONAA 67 02/13/2015 1329   GFRAA >60 06/09/2020 0506   GFRAA 77 02/13/2015 1329   Assessment/Planning: The patient is a 79 year old male with multiple medical issues including chronic right heel wound  1) Possible atherosclerotic disease to the right lower extremity in setting of chronic wound Patient with burn to right heel from heating pad which has not shown any signs of healing.  Patient with  multiple risk factors for atherosclerotic disease.  Plan for right lower extremity angiogram with possible intervention tomorrow with Dr. Lucky Cowboy.  Met with patient today, had a long discussion in regard to the procedure, risks and benefits.  All questions were answered.  Patient wishes to proceed.  2) s/p AAA repair in 2009 Patient s/p abdominal aortic aneurysm repair in 2009 with aortic stent graft.  May make access for angiogram difficult.  If unable to access it via the groin and significant disease is found may have to approach via pedal access  3) Chronic hypoxia due to COPD At baseline, patient is on 6 L nasal cannula We will monitor oxygen saturation during procedure  Have CPAP available  Discussed with Dr. Ellis Parents Hayly Litsey PA-C 06/09/2020 10:56 AM

## 2020-06-09 NOTE — Discharge Instructions (Signed)
Vascular Surgery Discharge Instructions:  1) you are scheduled to undergo right lower extremity angiogram with Dr. Lucky Cowboy on Monday, June 15, 2020. Please arrive at the [facility] medical mall at 10:30 AM to check in. 2) you underwent a Covid test on Tuesday, June 09, 2020. Please quarantine until your procedure on Monday. If you do not quarantine, your procedure will be canceled. 3) no eating or drinking after 12AM Monday morning.  4) you may take your morning medications on Monday with a sip of water.

## 2020-06-09 NOTE — Care Management Obs Status (Signed)
[facility] NOTIFICATION   Patient Details  Name: [name] MRN: [id number] Date of Birth: [birth year]   Medicare Observation Status Notification Given:  Yes    Beverly Sessions, RN 06/09/2020, 3:43 PM

## 2020-06-10 LAB — SARS CORONAVIRUS 2 (TAT 6-24 HRS): SARS Coronavirus 2: NEGATIVE

## 2020-06-10 NOTE — TOC Initial Note (Signed)
Transition of Care [facility]) - Initial/Assessment Note    Patient Details  Name: [name] MRN: [id number] Date of Birth: 04-01-1941  Transition [facility] CM/SW Contact:    Beverly Sessions, RN Phone Number: 06/10/2020, 8:32 AM  Clinical Narrative:                  Late entry Patient was discharged yesterday Patient lives at home with wife Patient states that all of his appointments have been virtual Denies issues obtaining his medications  States "for the most part I don't leave the house, but if I have to my wife takes me"  Patient states that he has chronic O2, rolling walker, and BSC at the home.   Wife transported at discharge  Patient to have home health services at discharge  First preference of home health agency was [facility].  They yare unable to accept due to his insurance.  Patient states he no longer has a preference of agency.  Referral made and accepted by [place] with [facility]       Patient Goals and CMS Choice        Expected Discharge Plan and Services           Expected Discharge Date: 06/09/20                                    Prior Living Arrangements/Services                       Activities of Daily Living      Permission Sought/Granted                  Emotional Assessment              Admission diagnosis:  Blood loss anemia [D50.0] Generalized abdominal pain [R10.84] Symptomatic anemia [D64.9] Burn of third degree of right foot, initial encounter [T25.321A] Diarrhea [R19.7] Patient Active Problem List   Diagnosis Date Noted  . Diarrhea 06/09/2020  . Bilateral hand numbness 06/09/2020  . Acute diarrhea 06/06/2020  . Non-healing wound of right heel 06/06/2020  . History of GI bleed 06/06/2020  . Anemia due to GI blood loss 06/06/2020  . Lobar pneumonia ([place]) 03/15/2020  . Hyperkalemia 03/15/2020  . Left knee pain 03/15/2020  . Anxiety with depression 03/14/2020  . Edema 02/25/2020  .  Overdose 09/21/2019  . OSA on CPAP   . Advanced care planning/counseling discussion   . Encounter for competency evaluation   . Chest pain 08/04/2019  . Acute respiratory failure with hypoxia and hypercapnia ([place]) 07/06/2019  . Acute febrile illness   . Hyperglycemia   . Weakness 05/05/2019  . Febrile illness 04/27/2019  . Breathlessness   . Goals of care, counseling/discussion   . Palliative care by specialist   . Encounter for hospice care discussion   . COPD with acute exacerbation ([place]) 04/06/2019  . COPD exacerbation ([place]) 02/28/2019  . Community acquired pneumonia of left lower lobe of lung   . Hypoxia 12/26/2018  . Acute on chronic respiratory failure with hypoxia ([place])   . Acute on chronic diastolic CHF (congestive heart failure) ([place]) 10/02/2018  . Acute respiratory failure ([place]) 10/02/2018  . Acute Cameron ulcer   . Acute GI bleeding 02/08/2018  . Anemia due to chronic kidney disease   . AKI (acute kidney injury) ([place]) 05/08/2017  . Type 2 diabetes mellitus ([place])  05/08/2017  . Melena   . Benign neoplasm of sigmoid colon   . Benign neoplasm of descending colon   . AVM (arteriovenous malformation) of colon with hemorrhage   . GI bleed 04/05/2017  . Intermittent left lower quadrant abdominal pain   . Generalized weakness 11/09/2016  . Symptomatic anemia 10/21/2016  . Low back pain 07/02/2015  . Gastric AVM   . AVM (arteriovenous malformation) of duodenum, acquired with hemorrhage   . Bipolar 1 disorder, mixed, moderate ([place]) 04/16/2015  . Major depressive disorder, recurrent, severe without psychotic features ([place])   . Supplemental oxygen dependent 11/30/2014  . Iron deficiency anemia due to chronic blood loss 11/25/2014  . Vitamin D deficiency 08/10/2014  . Insomnia 08/10/2014  . Polypharmacy 08/10/2014  . Chronic pain syndrome 08/10/2014  . 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 Abdominal  pain 12/04/2013  . Aftercare following surgery of the circulatory system, [place] 11/04/2013  . Acute blood loss anemia 10/16/2013  . Multiple gastric polyps 09/10/2013  . Anxiety state 09/10/2013  . 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 Chronic hypoxemic respiratory failure ([place]) 05/21/2013  . Acute on chronic respiratory failure with hypoxia and hypercapnia ([place]) 12/04/2012  . Chronic GI bleeding 05/17/2012  . CAD (coronary artery disease) 04/17/2012  . Chronic diastolic CHF (congestive heart failure) ([place]) 03/27/2012  . COPD (chronic obstructive pulmonary disease) ([place]) 09/13/2011  . CERUMEN IMPACTION, BILATERAL 11/11/2010  . Hyperlipidemia 08/18/2009  . Essential hypertension 08/18/2009  . ALLERGIC RHINITIS 08/18/2009  . AAA (abdominal aortic aneurysm) ([place]) 12/15/2008   PCP:  [name], MD Pharmacy:   [id number] [place], [address] [address] Phone: [phone] Fax: [facility] [place], [address] [place] [address] Phone: [phone] Fax: [phone]     Social Determinants of Health (SDOH) Interventions    Readmission Risk Interventions Readmission Risk Prevention Plan 06/09/2020 09/29/2019 08/15/2019  Transportation Screening Complete Complete Complete  Medication Review (RN Care Manager) Complete - Referral to Pharmacy  PCP or Specialist appointment within 3-5 days of discharge - Complete Complete  HRI or Home Care Consult Complete Complete Complete  SW Recovery Care/Counseling Consult - Complete -  Palliative Care Screening Not Applicable Not Applicable Complete  Skilled Nursing Facility Not Applicable Not Applicable Complete  Some recent data might be hidden

## 2020-06-11 ENCOUNTER — Ambulatory Visit: Payer: Medicare HMO | Admitting: Physician Assistant

## 2020-06-11 ENCOUNTER — Other Ambulatory Visit: Admit: 2020-06-11 | Payer: Medicare HMO

## 2020-06-15 ENCOUNTER — Ambulatory Visit: Admit: 2020-06-15 | Payer: Medicare HMO | Admitting: Vascular Surgery

## 2020-06-15 SURGERY — LOWER EXTREMITY ANGIOGRAPHY
Anesthesia: Moderate Sedation | Laterality: Right

## 2020-06-16 ENCOUNTER — Ambulatory Visit: Payer: Medicare HMO | Admitting: Podiatry

## 2020-06-17 ENCOUNTER — Telehealth (INDEPENDENT_AMBULATORY_CARE_PROVIDER_SITE_OTHER): Payer: Self-pay

## 2020-06-17 NOTE — Telephone Encounter (Signed)
I spoke with  the patient to schedule him for a right leg angio with Dr. Lucky Cowboy. Patient was angry and stated that we kicked him out of the hospital before he could have his procedure done on Monday which was scheduled while he was an inpatient. Patient stated he was discharged before his procedure which was in a couple days. I explained that I did not work at the hospital and that I was calling to get him rescheduled to have the procedure and the patient stated he was not going to have the procedure because it should have been taken care of while he was in the hospital . Patient was scheduled for his angio while he was an inpt at the time.

## 2020-07-17 DEATH — deceased

## 2020-09-28 IMAGING — DX DG CHEST 1V PORT
2 series · 2 of 2 positions shown · non-contrast
Comparison: 10/06/2019

CLINICAL DATA: Weakness.  Shortness of breath

EXAM:
PORTABLE CHEST 1 VIEW

[chest ap (1 of 2)]
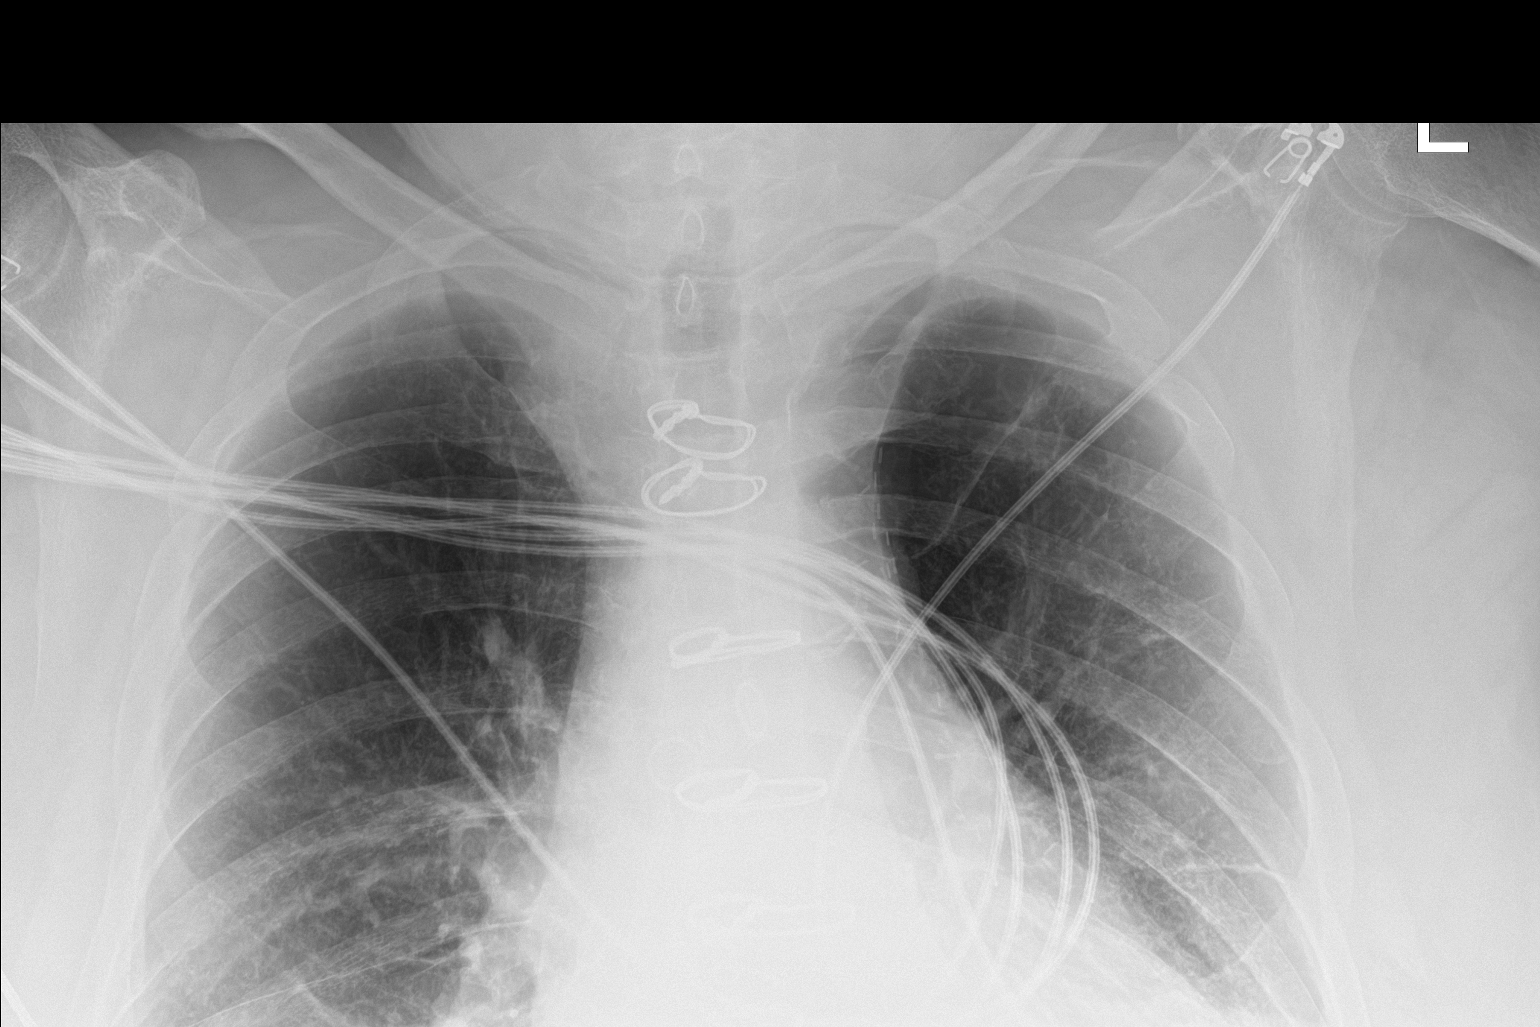

[chest ap (2 of 2)]
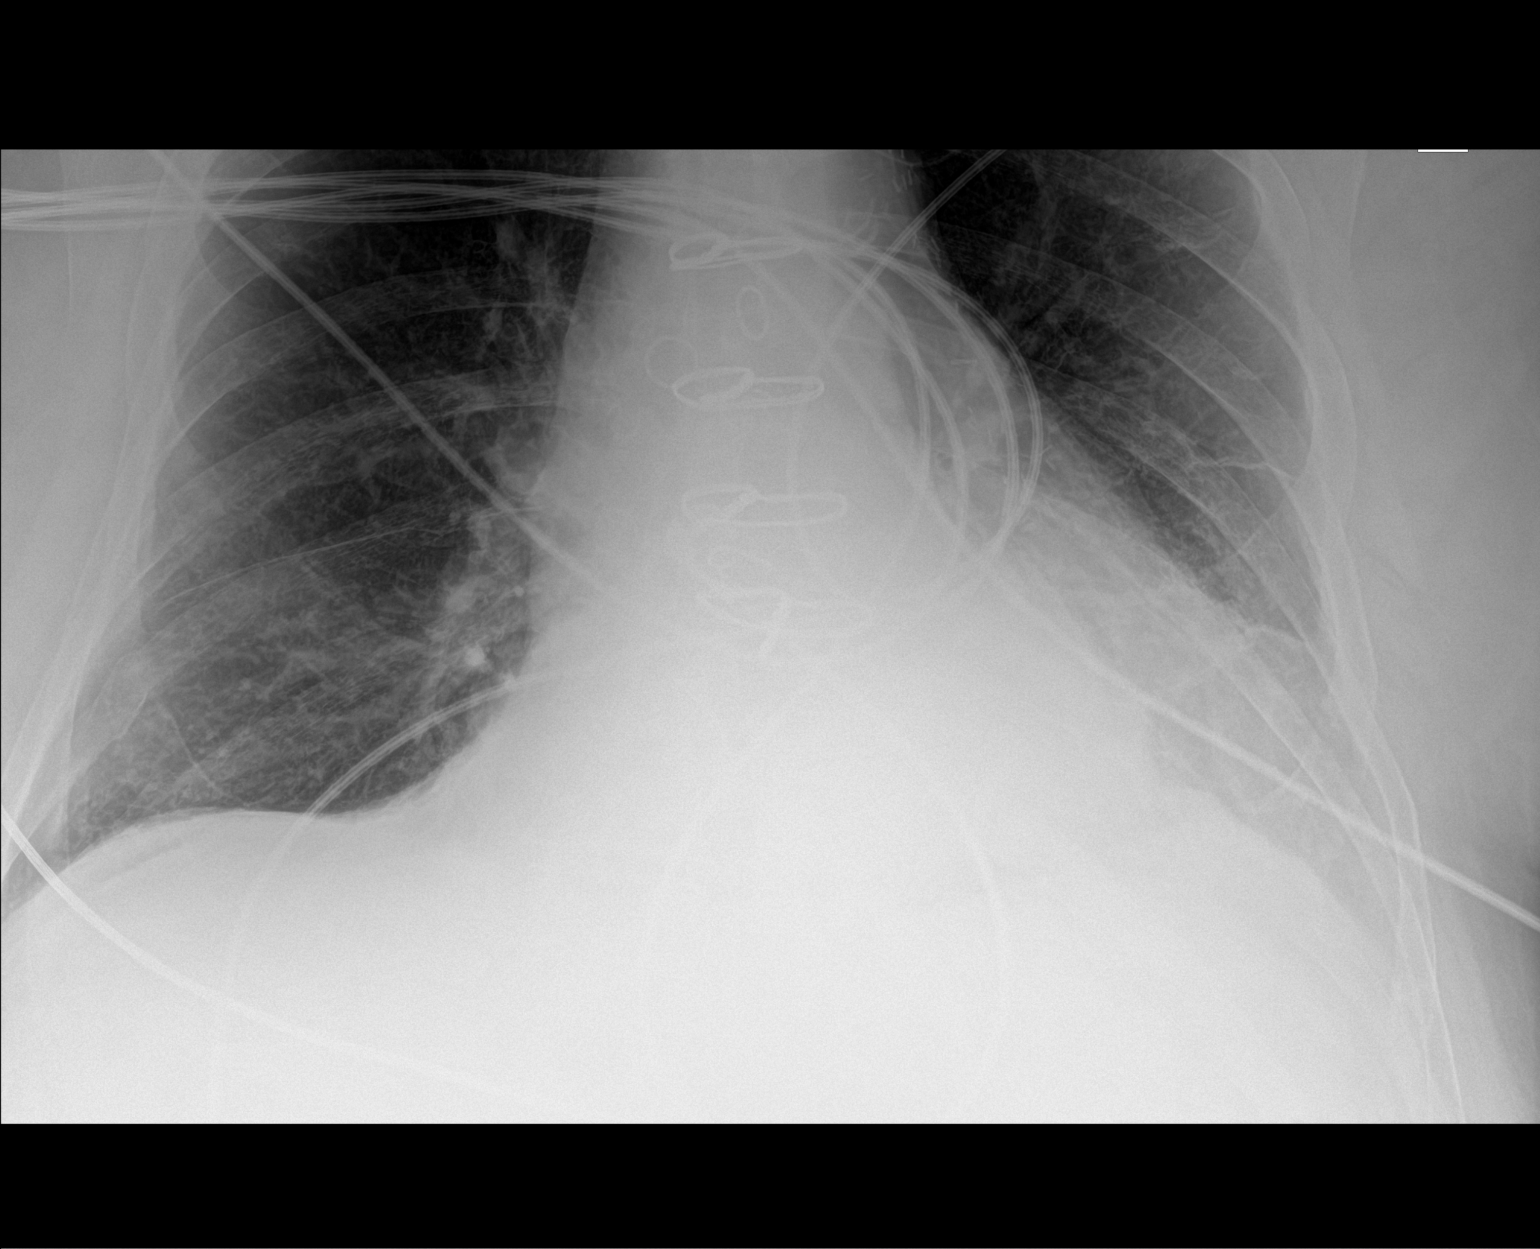

[2 of 2 positions shown; findings below may reference images not displayed]

FINDINGS: Normal heart size when accounting for a large hiatal hernia by 3939
chest CT. CABG. Haziness at the left base is primarily from the
hernia. Minor scarring in the left mid lung. There is no edema,
consolidation, effusion, or pneumothorax. Artifact from EKG leads
IMPRESSION: 1. No acute finding.  Stable from prior.
2. Large hiatal hernia.

## 2020-10-16 IMAGING — DX DG CHEST 1V PORT
2 series · 2 of 2 positions shown · non-contrast
Comparison: February 25, 2020

CLINICAL DATA: Shortness of breath and weakness

EXAM:
PORTABLE CHEST 1 VIEW

[chest ap (1 of 2)]
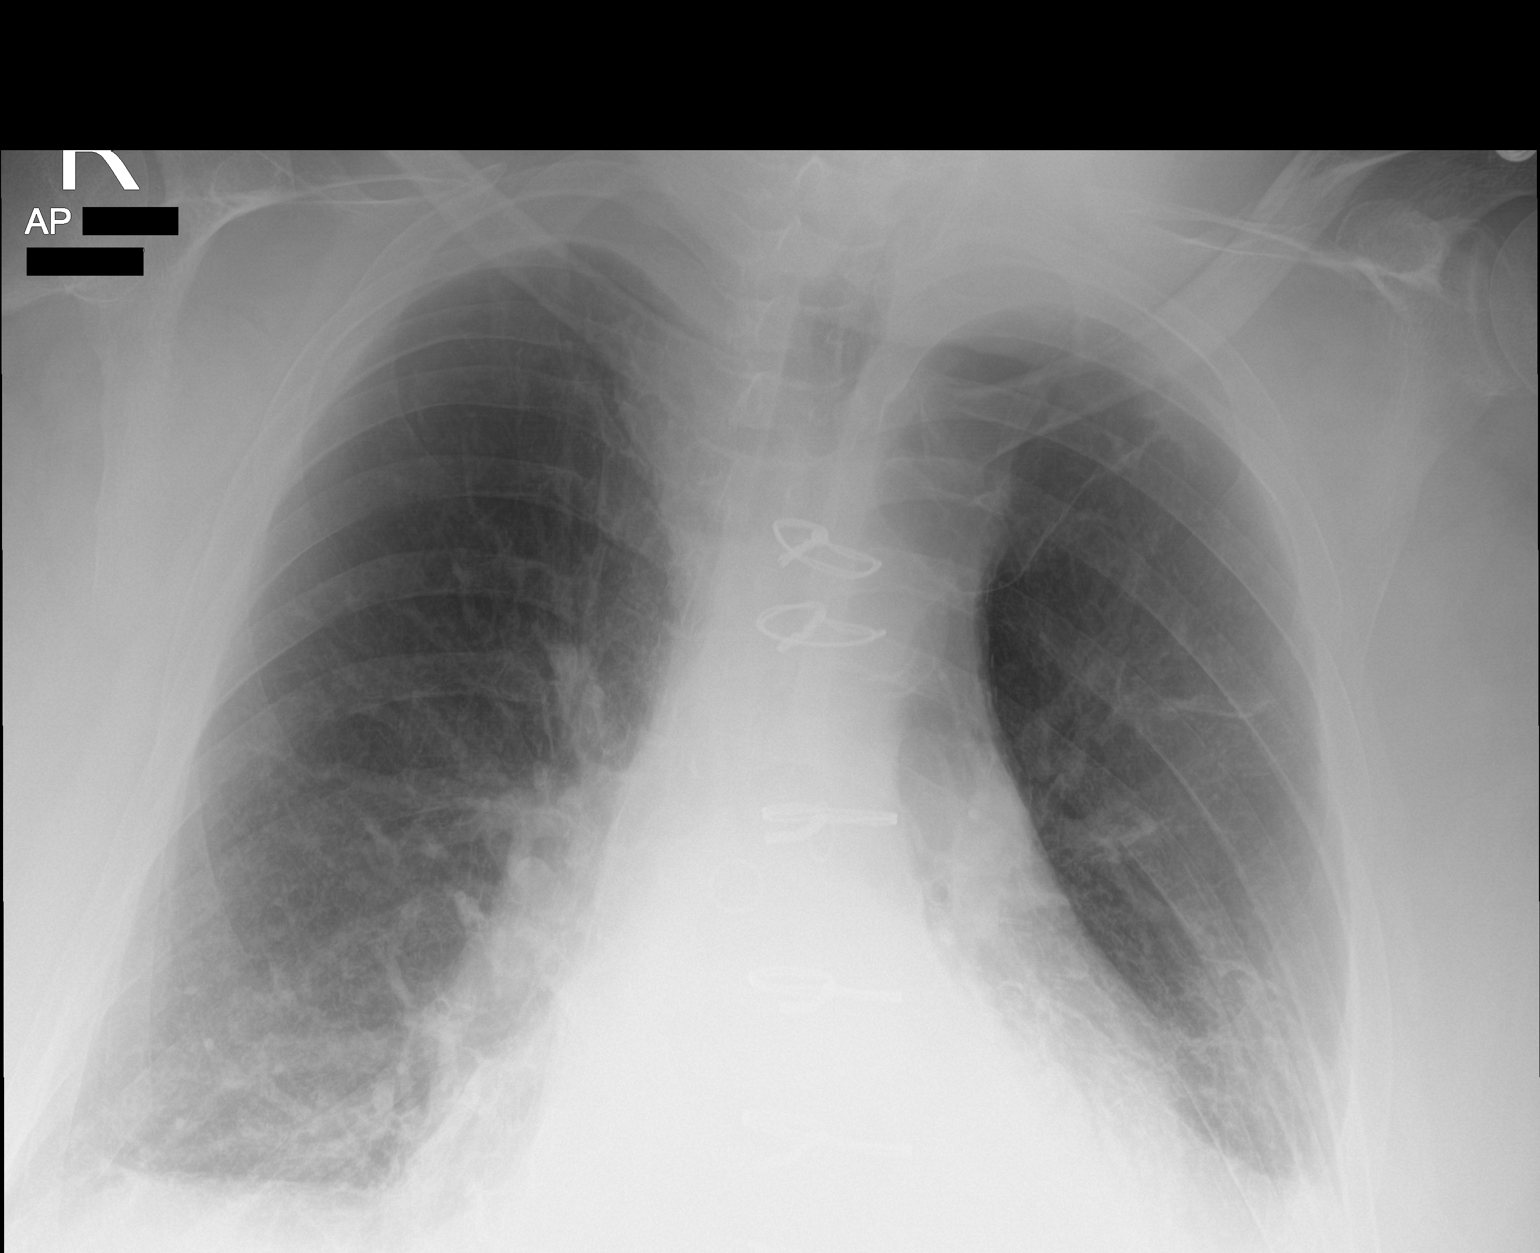

[chest ap (2 of 2)]
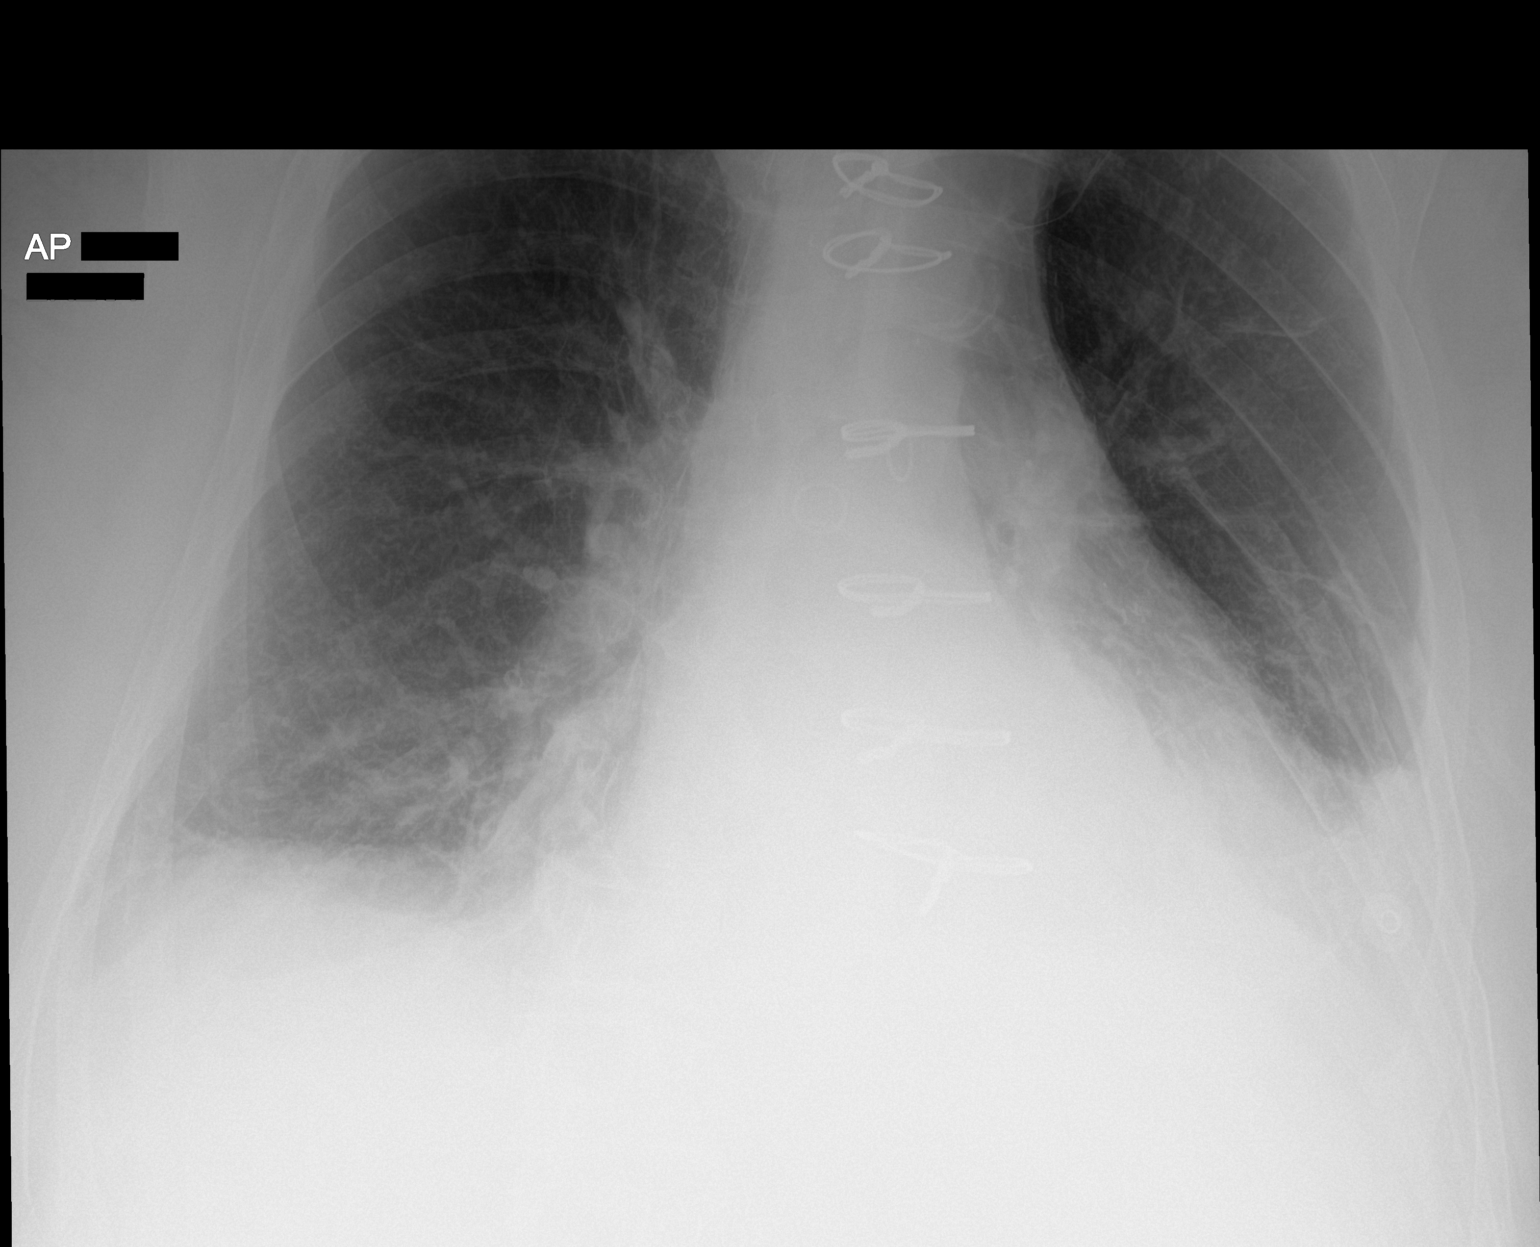

[2 of 2 positions shown; findings below may reference images not displayed]

FINDINGS: There is ill-defined opacity in the lateral left base concerning for
a focus of pneumonia, potentially with small associated pleural
effusion. There is mild bibasilar atelectasis. There is also slight
atelectasis in the left upper lobe.

Heart is upper normal in size with pulmonary vascularity. Patient is
status post coronary artery bypass grafting. There is a large hiatal
type hernia. There is aortic atherosclerosis. No bone lesions.
IMPRESSION: Suspect pneumonia with small pleural effusion left base. Scattered
areas of atelectatic change in the bases and left mid lung.

Stable cardiac silhouette. Large hiatal type hernia. Status post
coronary artery bypass grafting. Aortic Atherosclerosis
([id number]).

## 2020-10-17 IMAGING — DX DG KNEE COMPLETE 4+V*L*
4 series · 4 of 4 positions shown · non-contrast
Comparison: None.

CLINICAL DATA: Continued chronic left knee pain.

EXAM:
LEFT KNEE - COMPLETE 4+ VIEW

[knee ap]
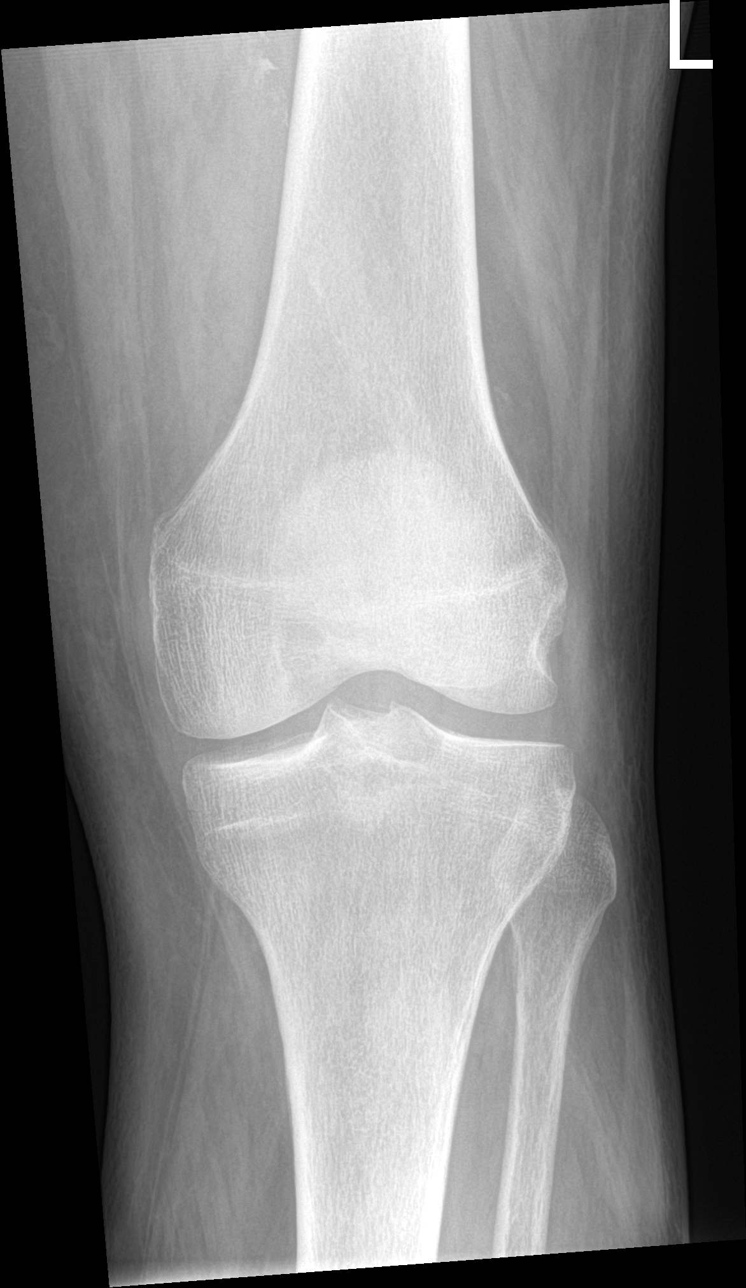

[knee obl (1 of 2)]
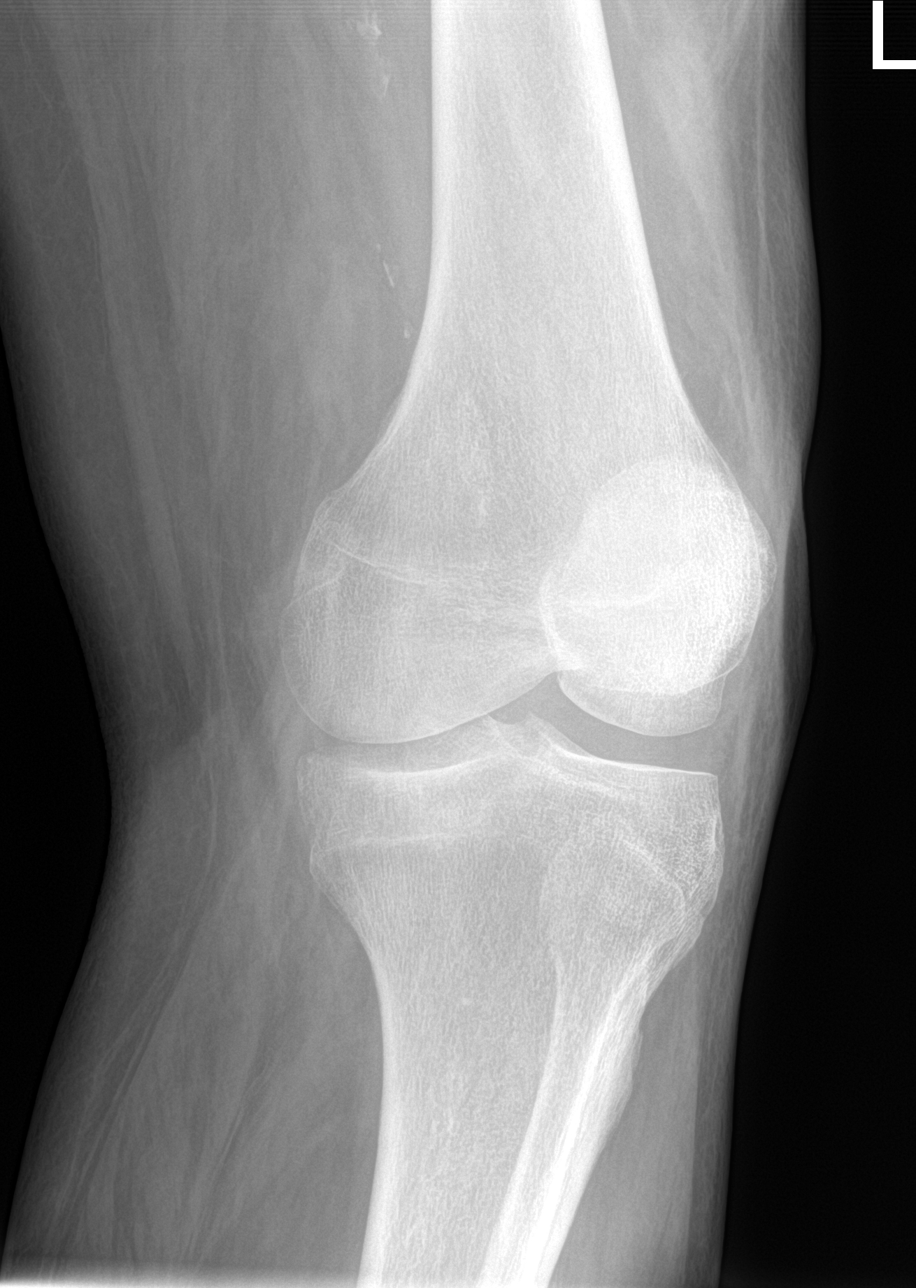

[knee obl (2 of 2)]
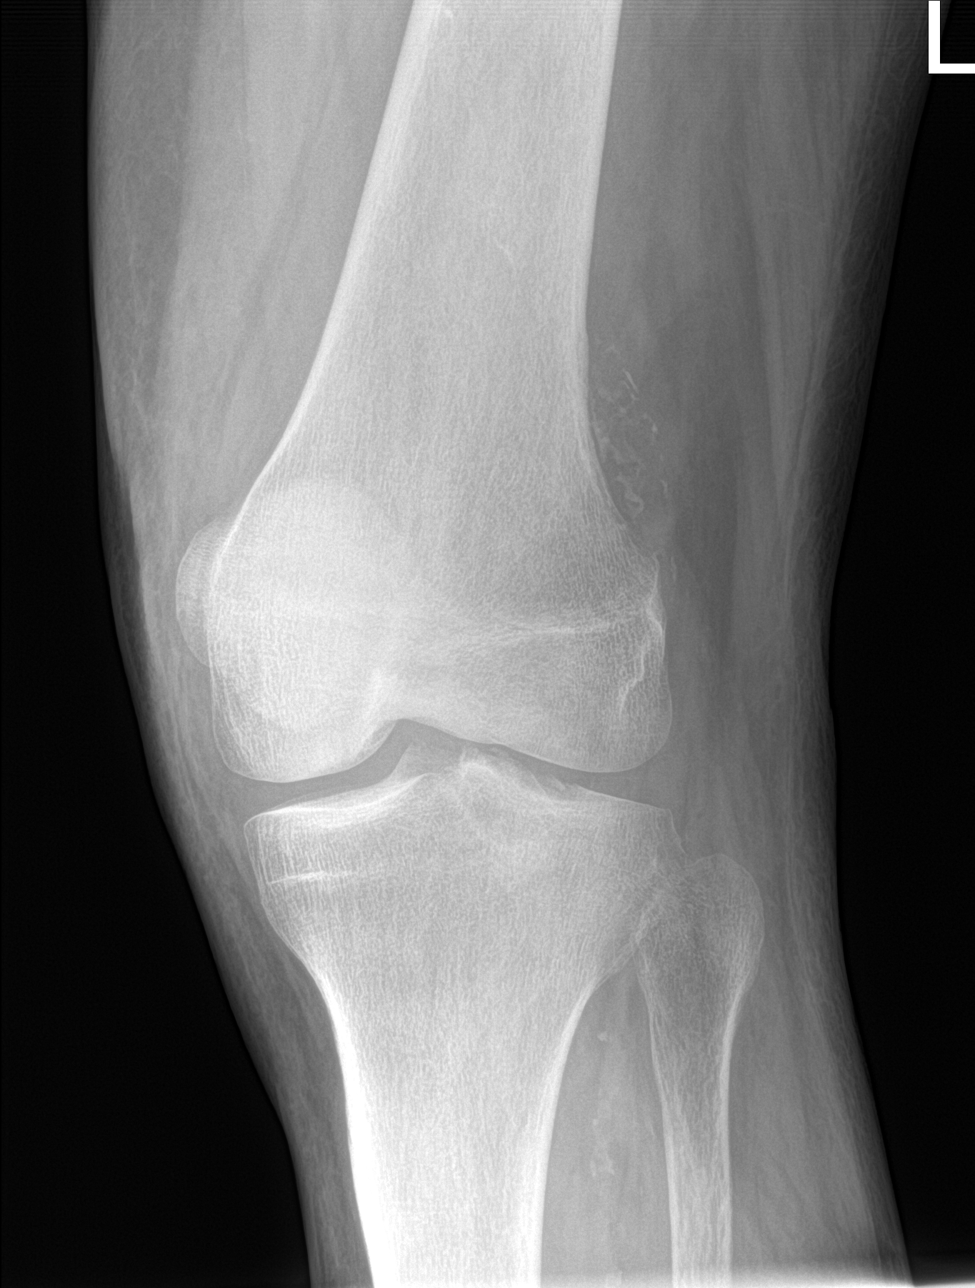

[knee lat]
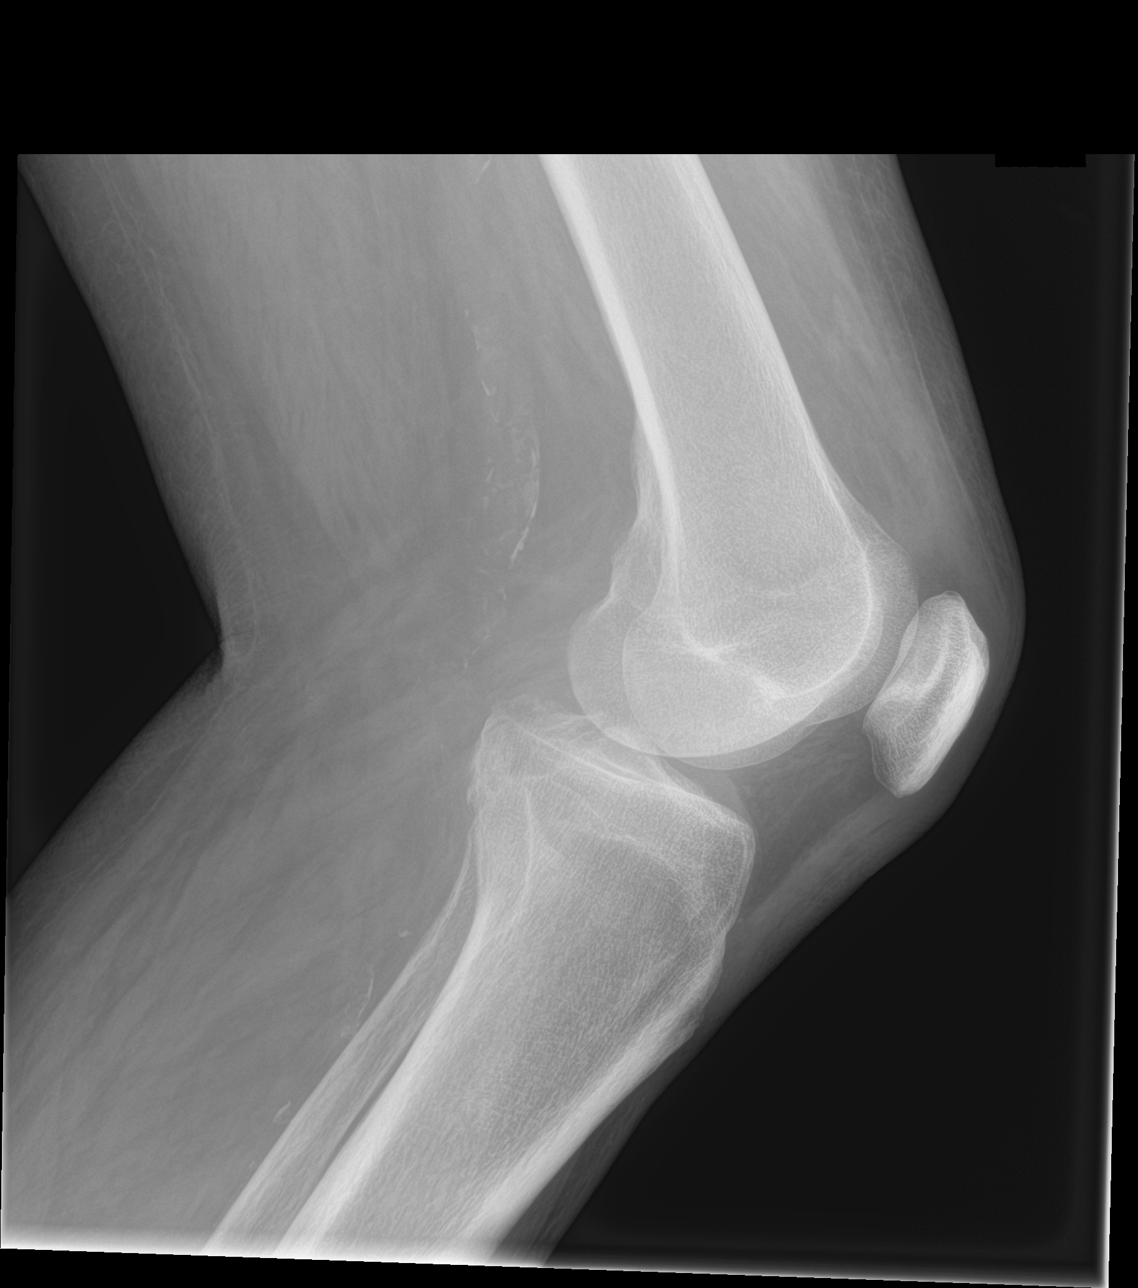

[4 of 4 positions shown; findings below may reference images not displayed]

FINDINGS: Vascular calcifications. No fracture or effusion. No significant
degenerative changes. No bony lesions.
IMPRESSION: No acute abnormalities.  No cause for pain identified.

## 2021-01-07 IMAGING — DX DG FOOT COMPLETE 3+V*R*
3 series · 3 of 3 positions shown · non-contrast
Comparison: None.

CLINICAL DATA: Right foot burn

EXAM:
RIGHT FOOT COMPLETE - 3+ VIEW

[foot ap]
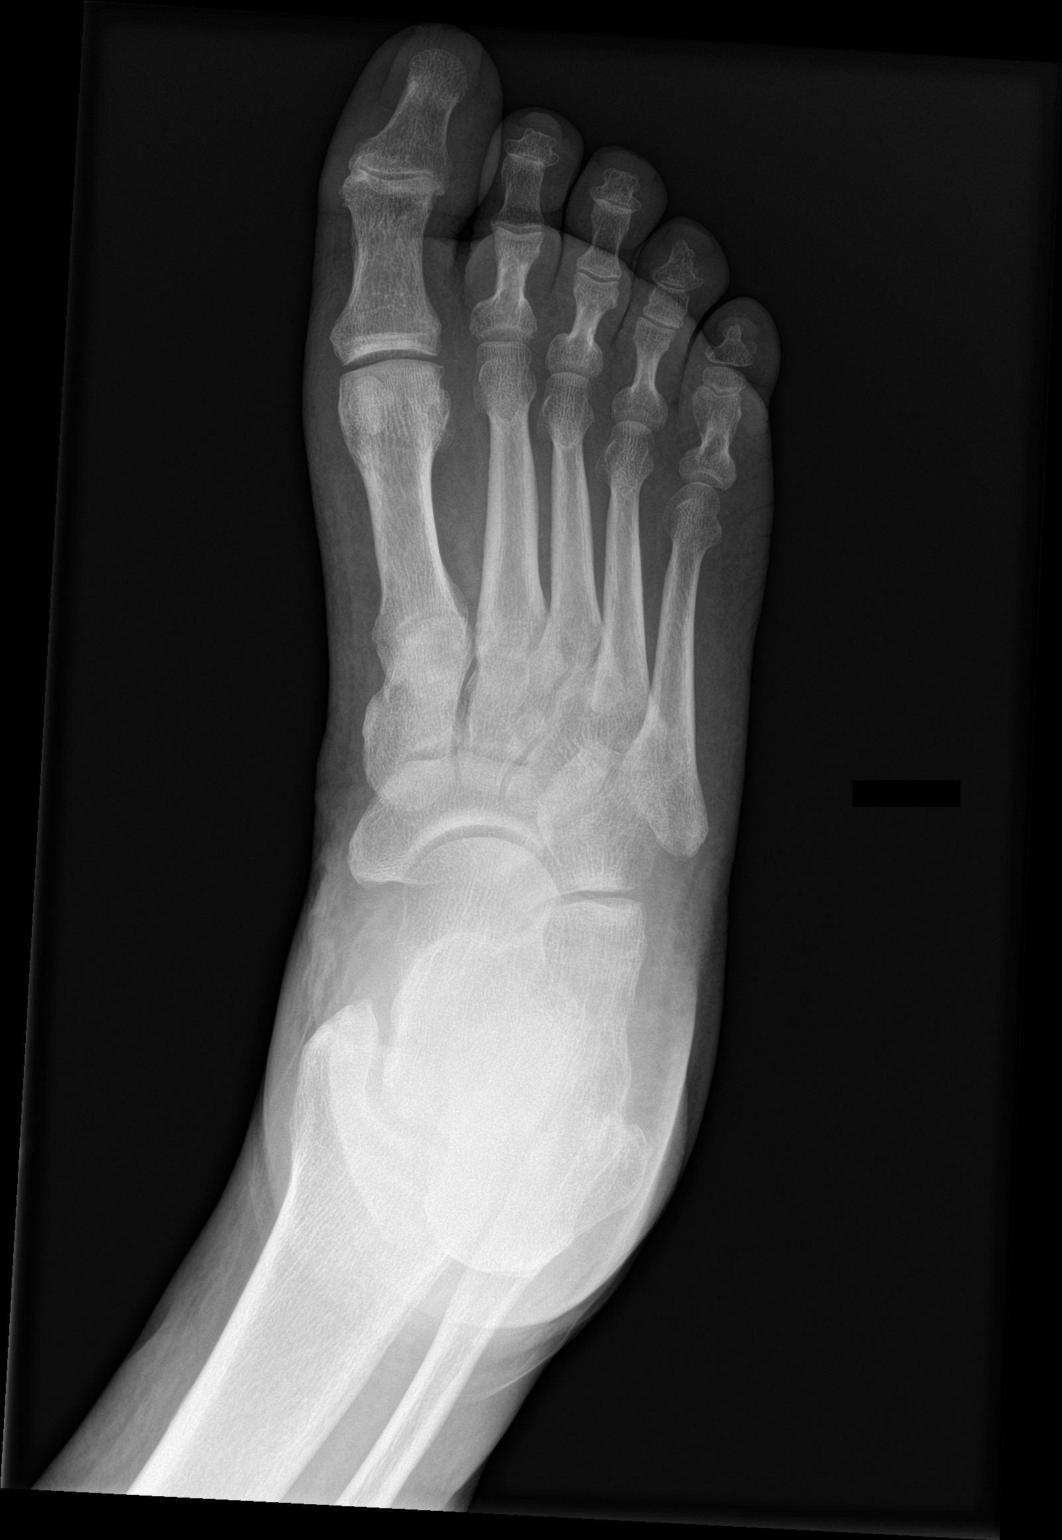

[foot obl]
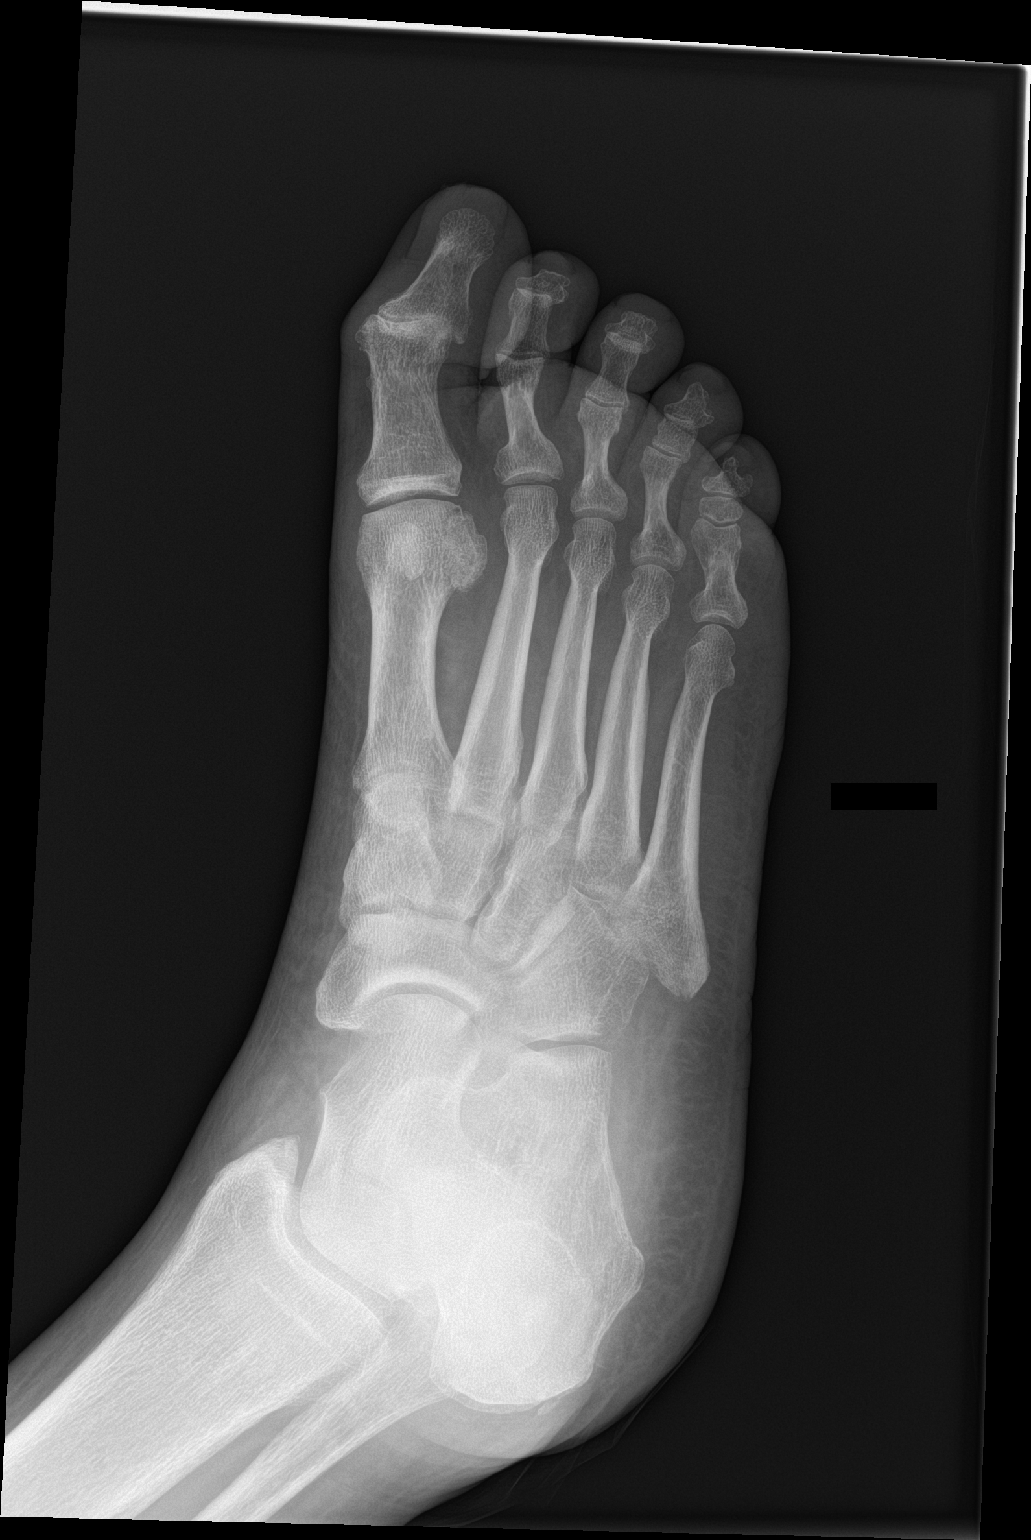

[foot lat]
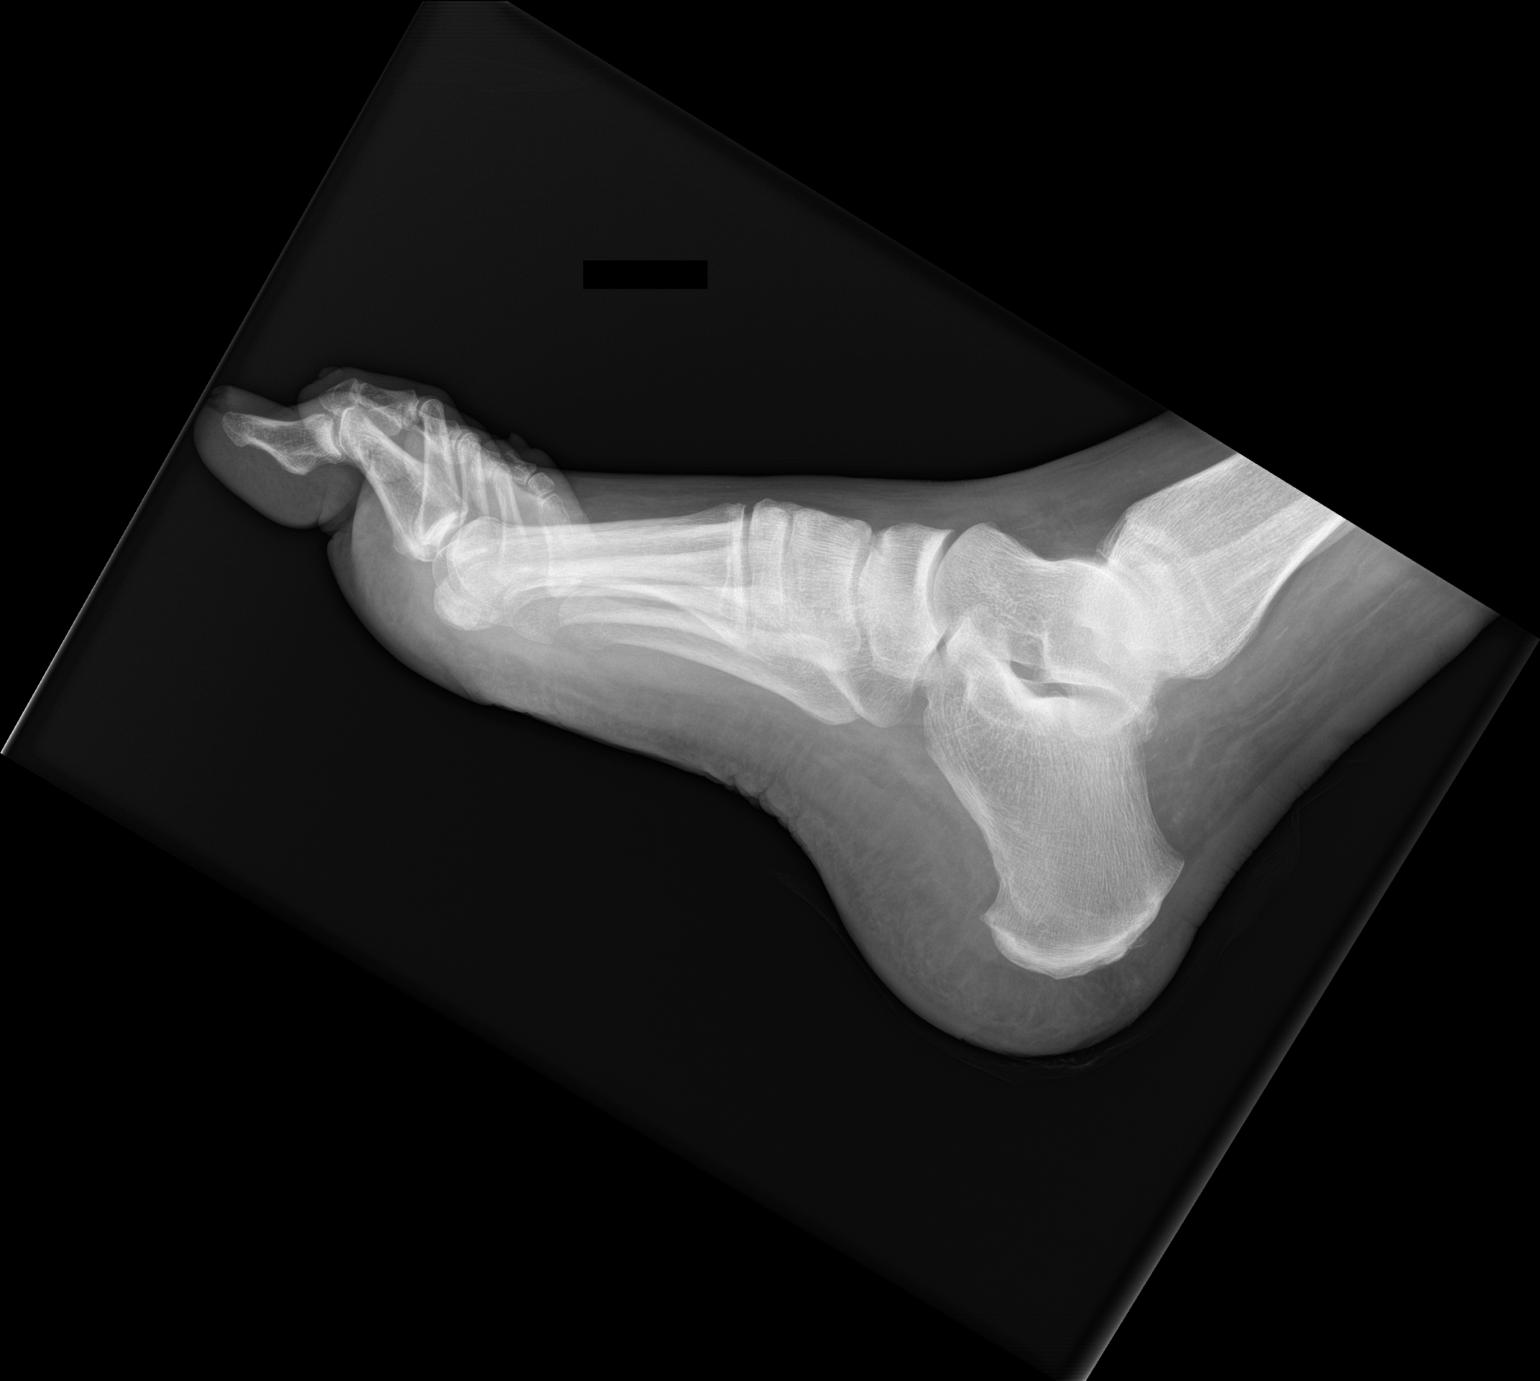

[3 of 3 positions shown; findings below may reference images not displayed]

FINDINGS: No fracture or malalignment. No radiopaque foreign body. No
periostitis or bone destruction. Mild degenerative change at the
first MTP joint
IMPRESSION: No acute osseous abnormality.

## 2021-01-07 IMAGING — CT CT ABD-PELV W/O CM
2 of 4 series · 16 of 46 positions shown, 18 images · non-contrast
Comparison: 06/28/2017

CLINICAL DATA: Generalized abdominal pain, abdominal distension

EXAM:
CT ABDOMEN AND PELVIS WITHOUT CONTRAST
TECHNIQUE: Multidetector CT imaging of the abdomen and pelvis was performed
following the standard protocol without IV contrast.

[Series 2: routine abd/pel wo · axial · 0.98mm/px · z∈[-1368,-883]mm · 13 of 107 slices shown, 15 images]
[im 5/107  soft-tissue]
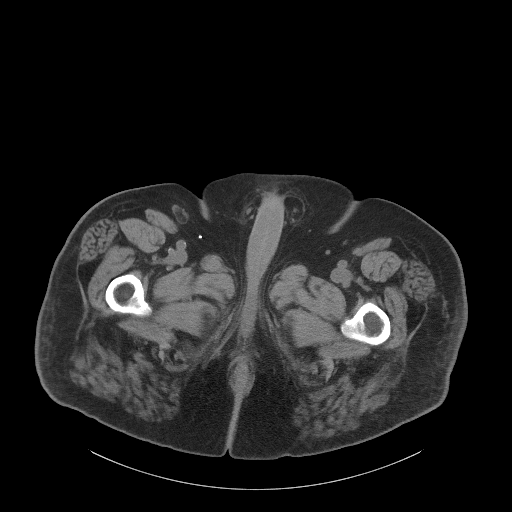
[im 5/107  bone]
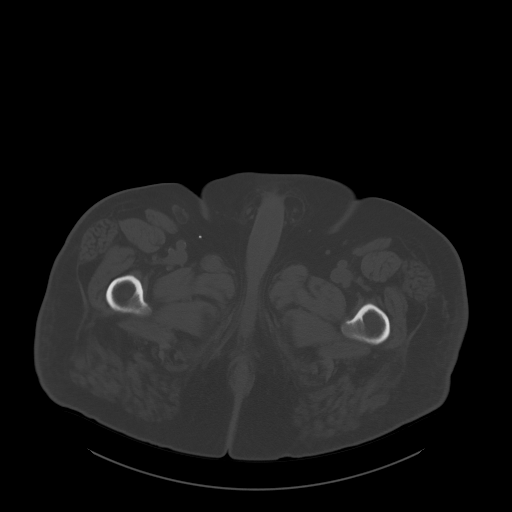
[im 14/107  soft-tissue]
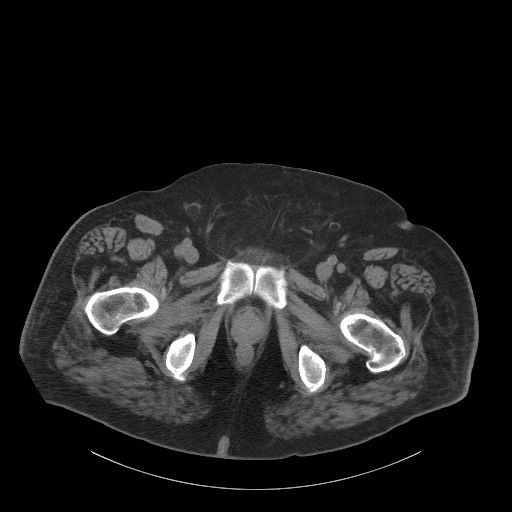
[im 23/107  soft-tissue]
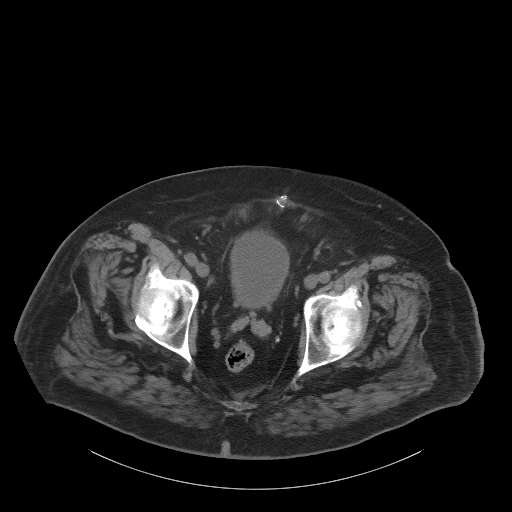
[im 31/107  soft-tissue]
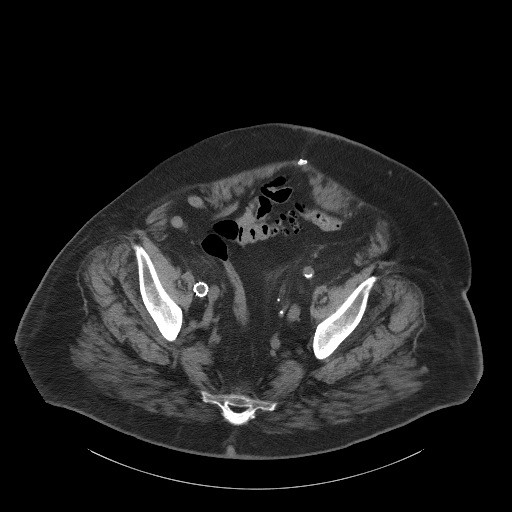
[im 36/107  soft-tissue]
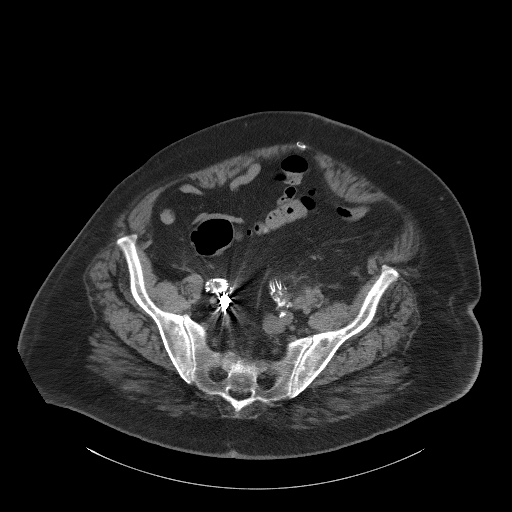
[im 45/107  soft-tissue]
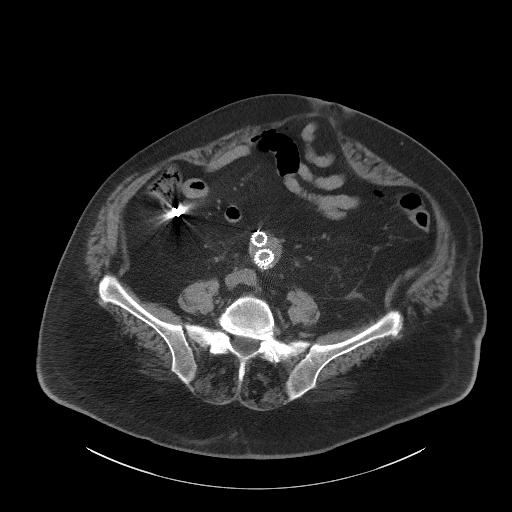
[im 54/107  soft-tissue]
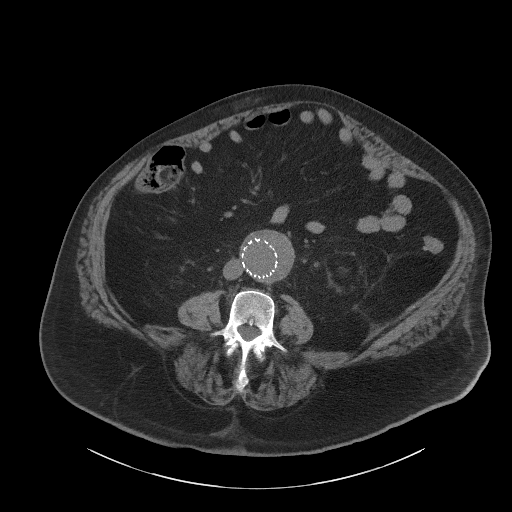
[im 62/107  soft-tissue]
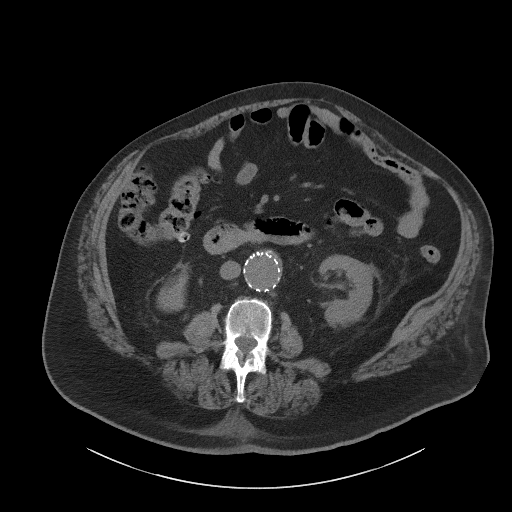
[im 71/107  soft-tissue]
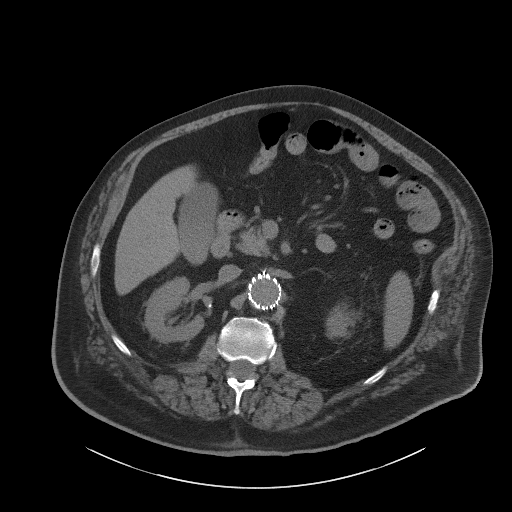
[im 71/107  bone]
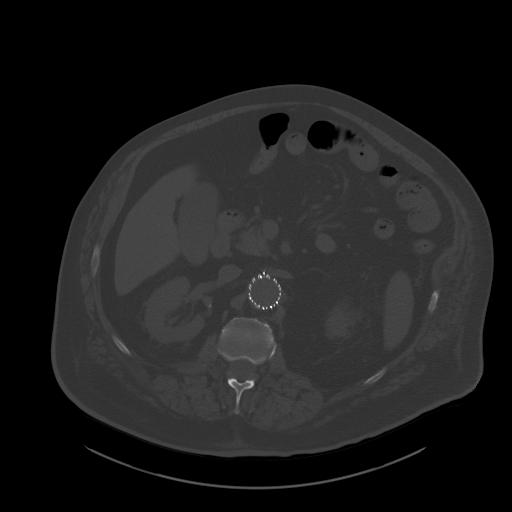
[im 76/107  soft-tissue]
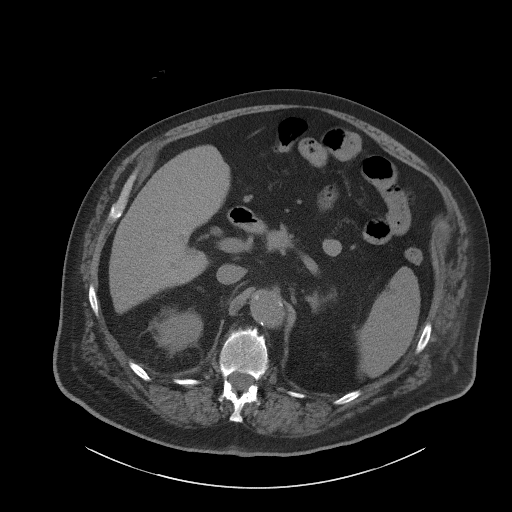
[im 84/107  soft-tissue]
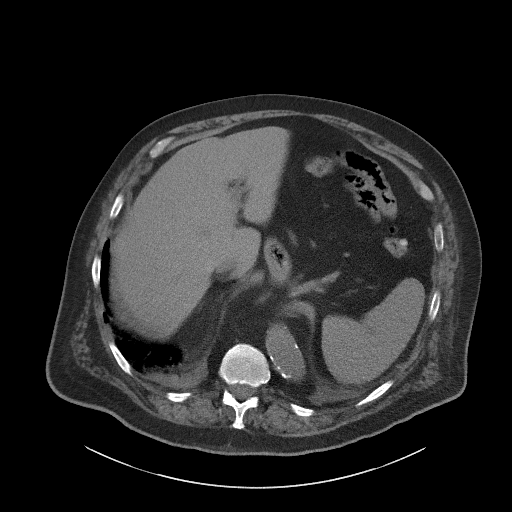
[im 93/107  soft-tissue]
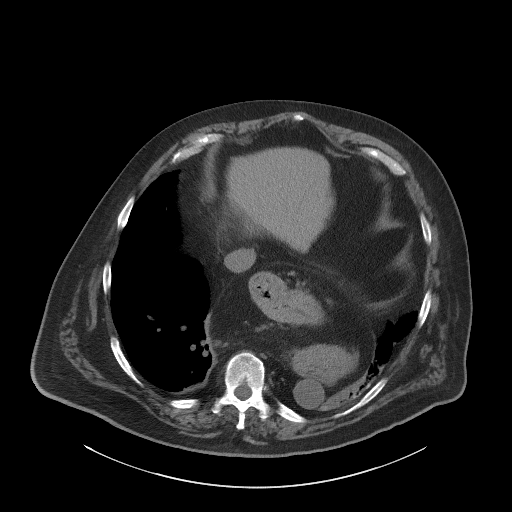
[im 102/107  soft-tissue]
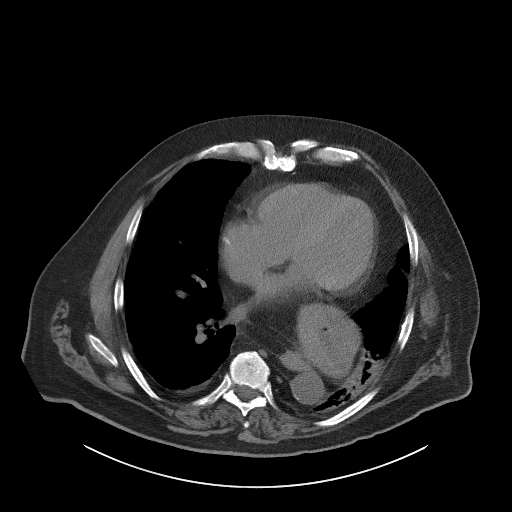

[Series 5: coronal st · coronal · 0.92mm/px · 3 of 120 slices shown]
[im 40/120  soft-tissue]
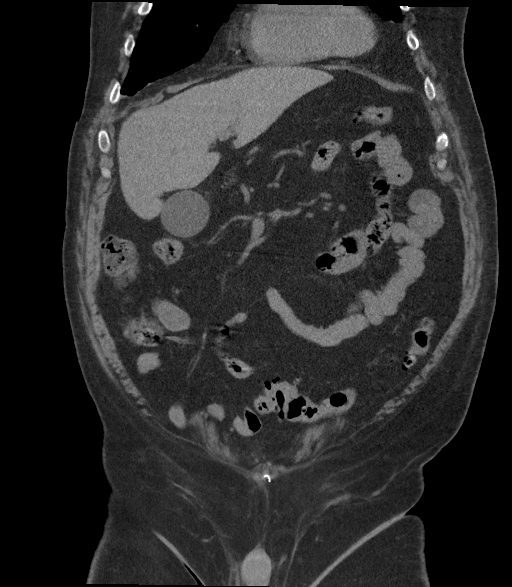
[im 53/120  soft-tissue]
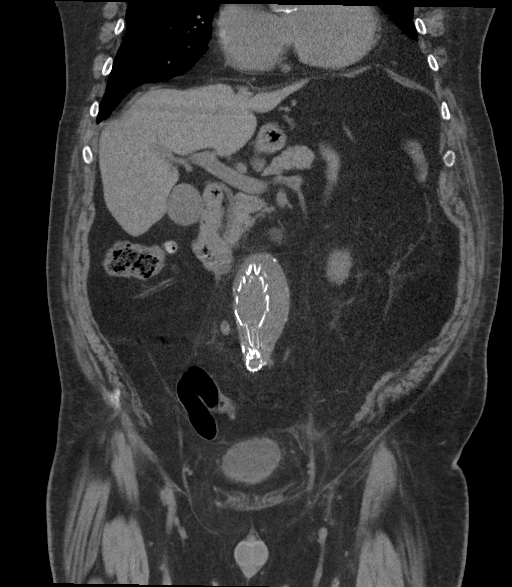
[im 67/120  soft-tissue]
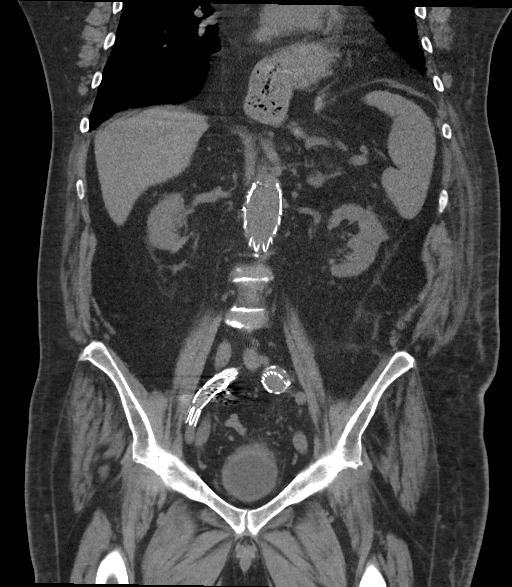

[16 of 46 positions shown; findings below may reference images not displayed]

FINDINGS: Lower chest: There is compressive atelectasis within the left lower
lobe. No acute pleural or parenchymal lung disease. There is a large
hiatal hernia.

Hepatobiliary: No focal liver abnormality is seen. No gallstones,
gallbladder wall thickening, or biliary dilatation.

Pancreas: Unremarkable. No pancreatic ductal dilatation or
surrounding inflammatory changes.

Spleen: Normal in size without focal abnormality.

Adrenals/Urinary Tract: Adrenal glands are unremarkable. Kidneys are
normal, without renal calculi, focal lesion, or hydronephrosis.
Bladder is unremarkable.

Stomach/Bowel: No bowel obstruction or ileus. The appendix is
surgically absent. Diffuse colonic diverticulosis without
diverticulitis.

Vascular/Lymphatic: Stable abdominal aortic aneurysm with
endoluminal stent graft unchanged. Outer diameter of the aneurysm
sac is stable, measuring 5.3 cm. Evaluation of the aortic lumen is
limited without intravenous contrast. There is diffuse
atherosclerosis.

No pathologic adenopathy.

Reproductive: Prostate is unremarkable.

Other: No free fluid or free gas. Stable small fat containing
umbilical hernia. No bowel herniation.

Musculoskeletal: No acute or destructive bony lesions. Reconstructed
images demonstrate no additional findings.
IMPRESSION: 1. No acute intra-abdominal or intrapelvic process.
2. Large hiatal hernia, with compressive atelectasis of the left
lower lobe.
3. Diverticulosis without diverticulitis.
4. Stable 5.3 cm abdominal aortic aneurysm status post endoluminal
stent graft repair.
5.  Aortic Atherosclerosis ([id number]).

## 2021-01-08 IMAGING — CT CT HEAD W/O CM
3 of 4 series · 14 of 47 positions shown, 16 images · non-contrast
Comparison: September 14, 2019

CLINICAL DATA: Mental status change

EXAM:
CT HEAD WITHOUT CONTRAST
TECHNIQUE: Contiguous axial images were obtained from the base of the skull
through the vertex without intravenous contrast.

[Series 3: ax head wo · axial · 0.31mm/px · z∈[-55,+63]mm · 8 of 31 slices shown, 10 images]
[im 3/31  brain]
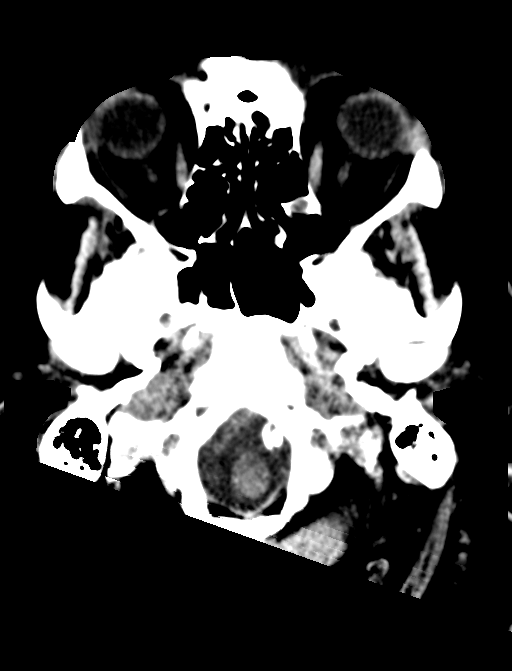
[im 3/31  bone]
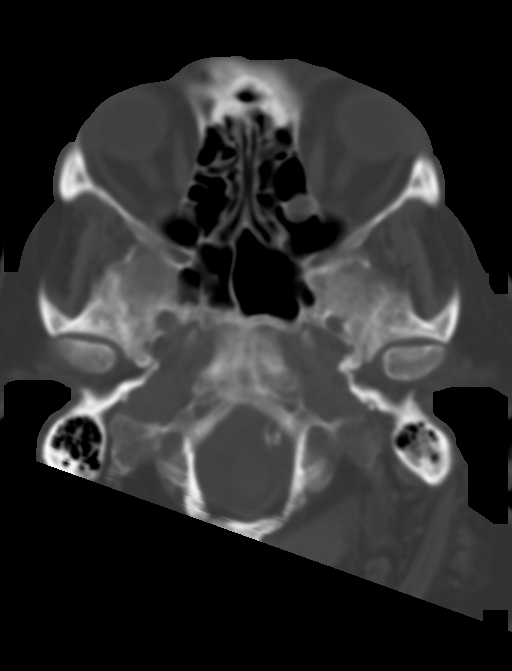
[im 7/31  brain]
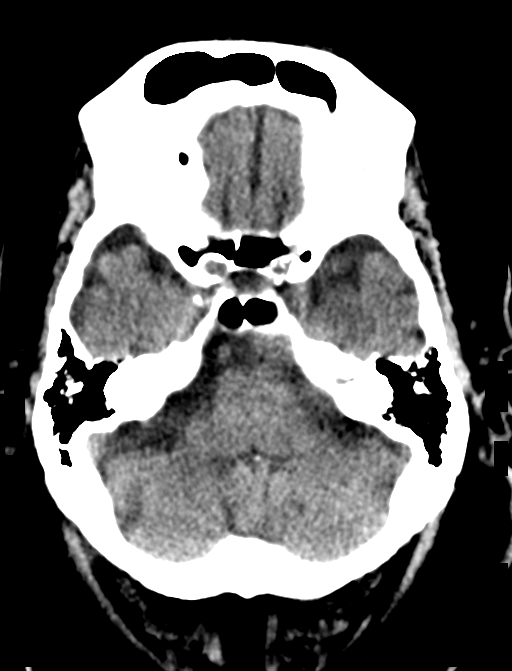
[im 11/31  brain]
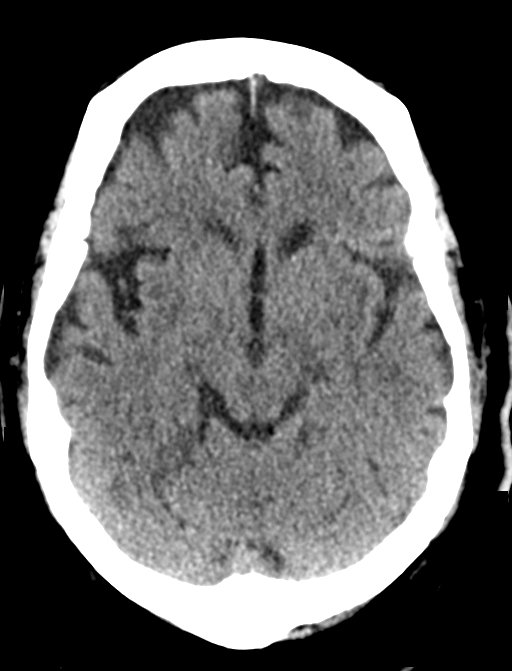
[im 13/31  brain]
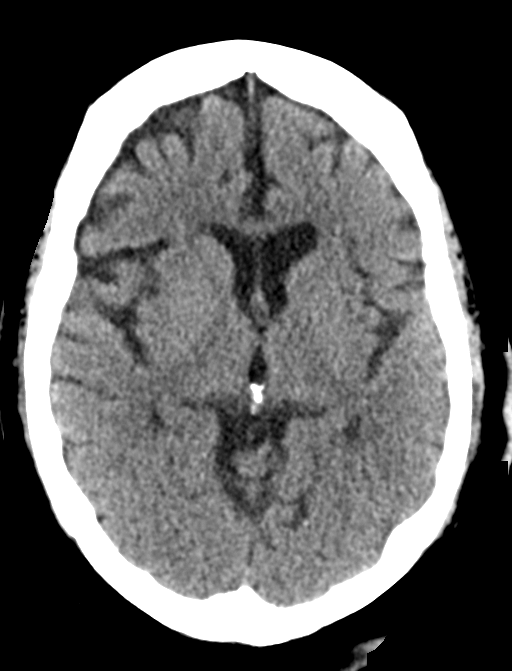
[im 18/31  brain]
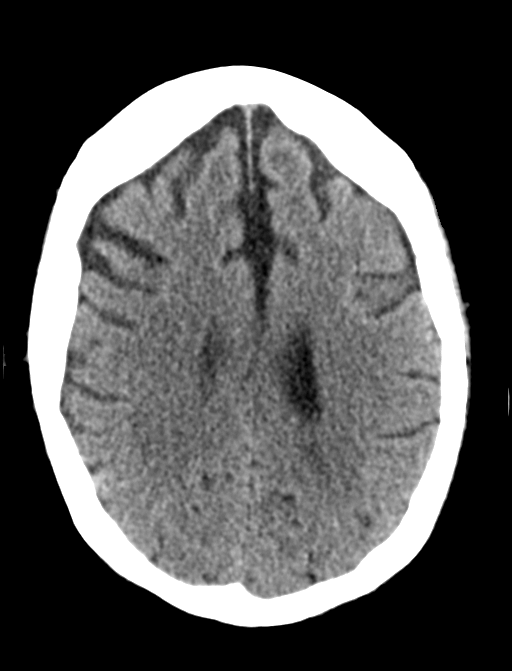
[im 18/31  bone]
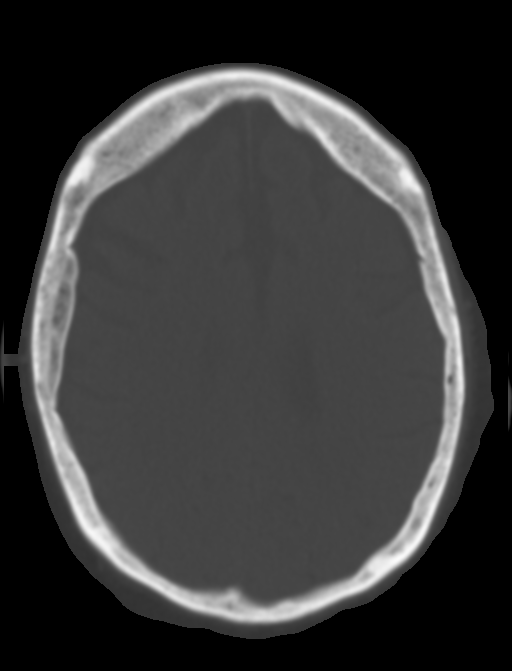
[im 20/31  brain]
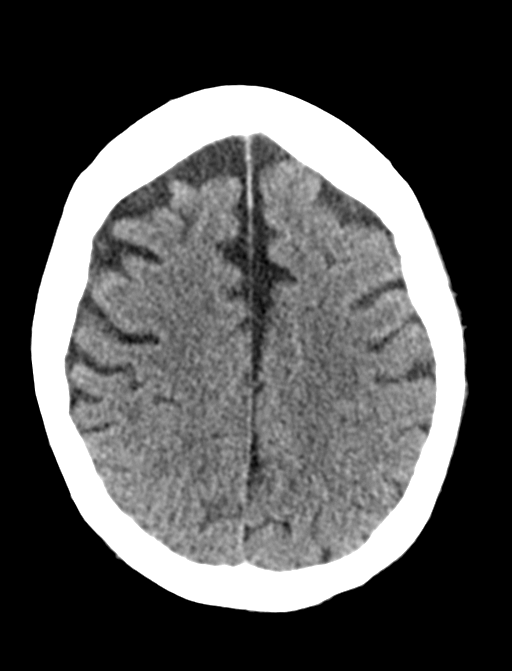
[im 24/31  brain]
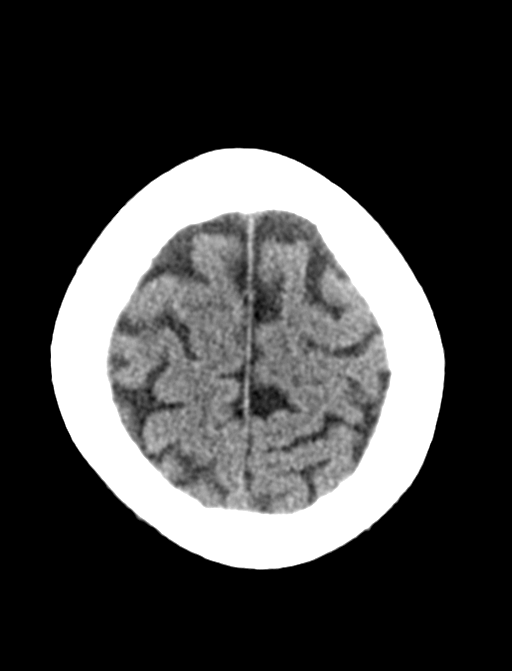
[im 28/31  brain]
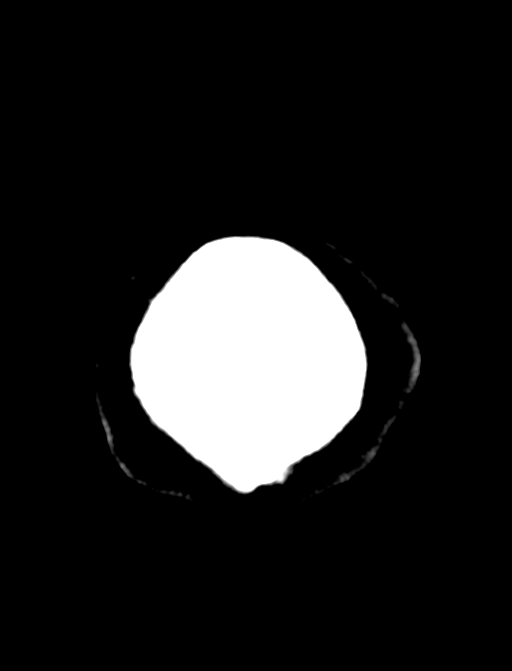

[Series 4: coronal soft tissue · coronal · 0.29mm/px · 3 of 67 slices shown]
[im 23/67  brain]
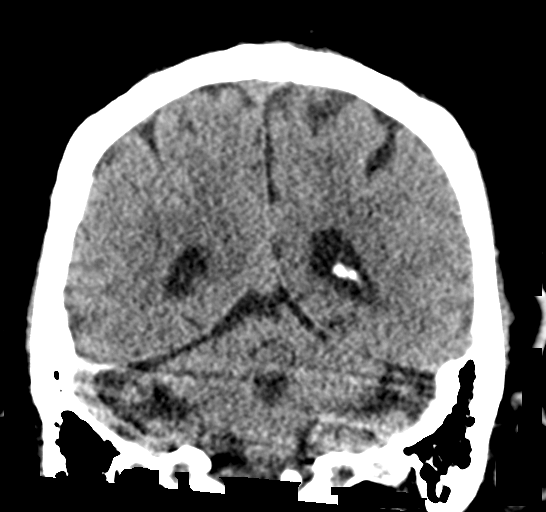
[im 30/67  brain]
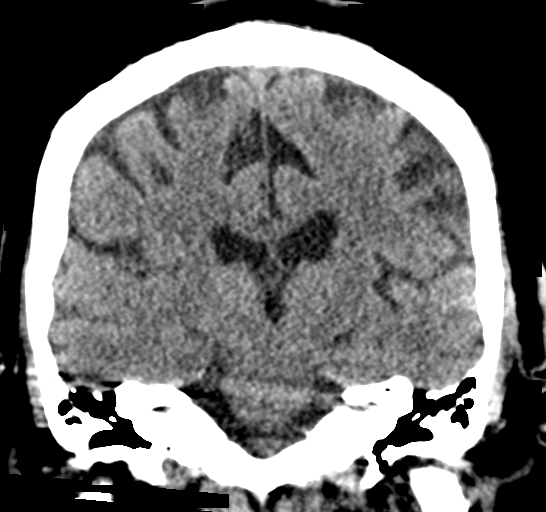
[im 37/67  brain]
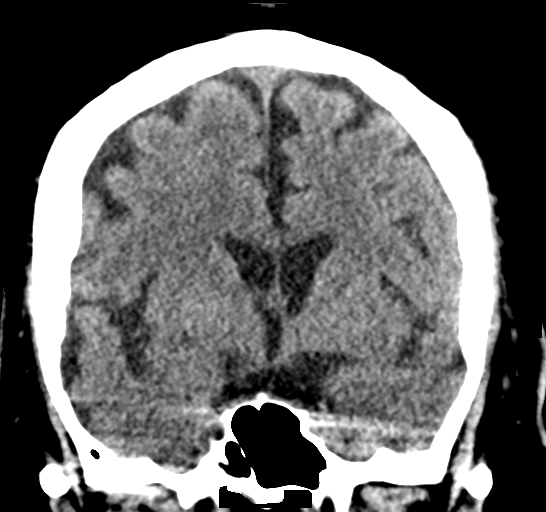

[Series 5: sagittal soft tissue · sagittal · 0.29mm/px · 3 of 54 slices shown]
[im 18/54  brain]
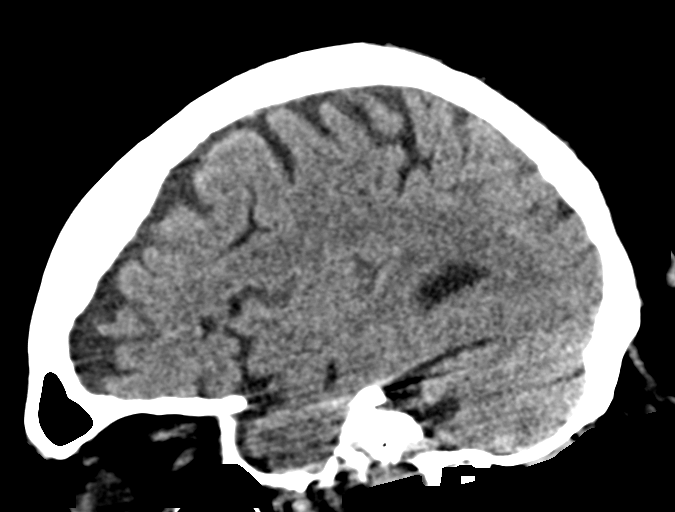
[im 27/54  brain]
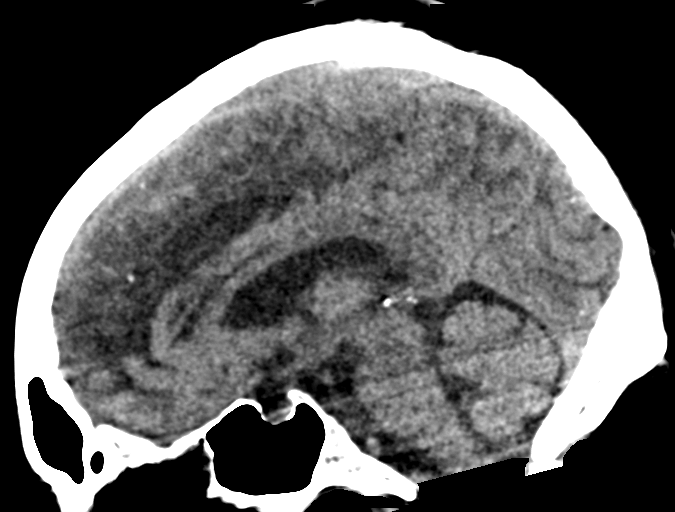
[im 36/54  brain]
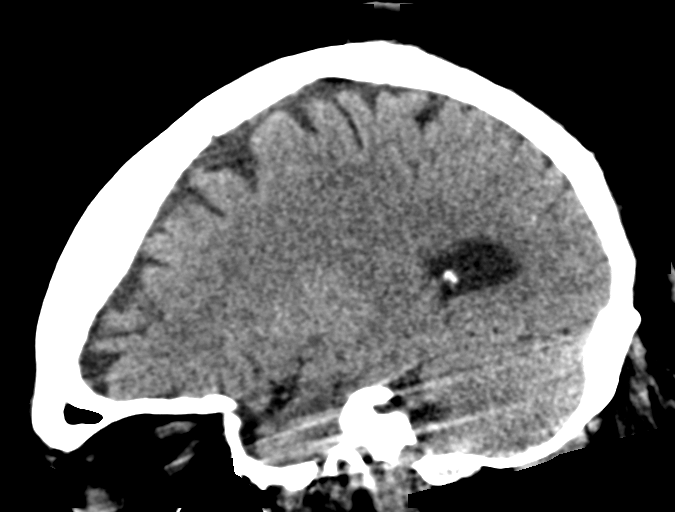

[14 of 47 positions shown; findings below may reference images not displayed]

FINDINGS: Brain: No subdural, epidural, or subarachnoid hemorrhage. Ventricles
and sulci are normal. Cerebellum, brainstem, and basal cisterns are
unremarkable. No mass effect or midline shift. No acute cortical
ischemia or infarct.

Vascular: No hyperdense vessel or unexpected calcification.

Skull: Normal. Negative for fracture or focal lesion.

Sinuses/Orbits: No acute finding.

Other: None.
IMPRESSION: 1. No acute intracranial abnormalities.

## 2021-01-09 IMAGING — MR MR CERVICAL SPINE W/O CM
2 series · 34 of 34 positions shown · non-contrast
Comparison: None.

CLINICAL DATA: Cervical radiculopathy

EXAM:
MRI CERVICAL SPINE WITHOUT CONTRAST
TECHNIQUE: Multiplanar, multisequence MR imaging of the cervical spine was
performed. No intravenous contrast was administered.

[Series 5: T2 · sagittal · 3.0mm · 0.62mm/px · 17 of 17 slices shown]
[im 1/17]
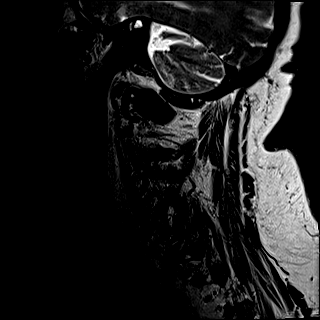
[im 2/17]
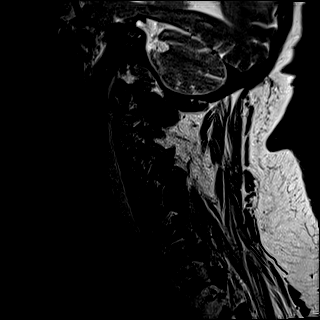
[im 3/17]
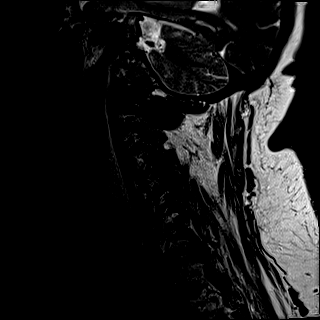
[im 4/17]
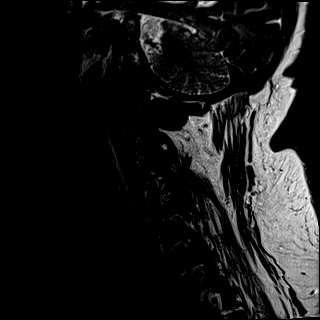
[im 5/17]
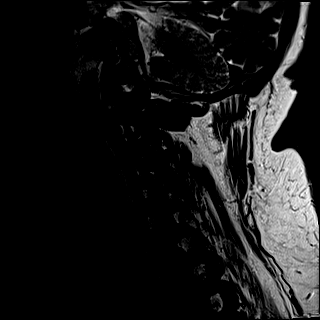
[im 6/17]
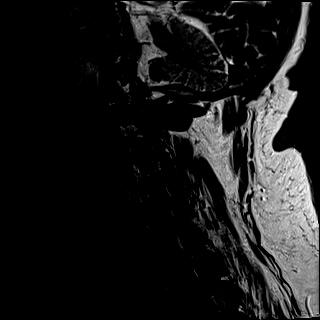
[im 7/17]
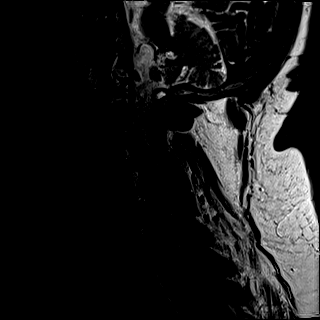
[im 8/17]
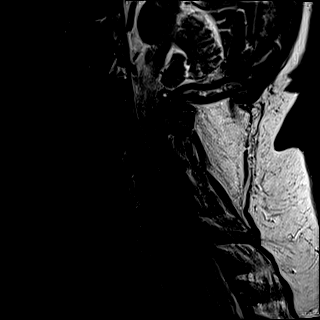
[im 9/17]
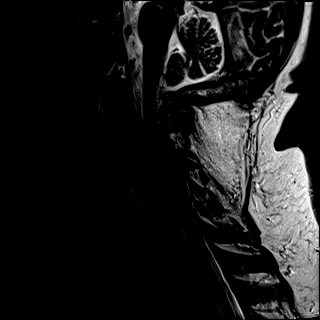
[im 10/17]
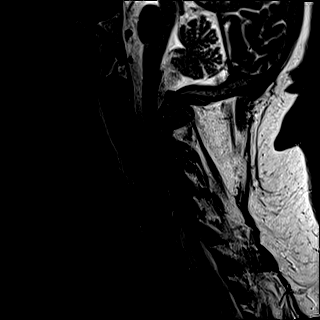
[im 11/17]
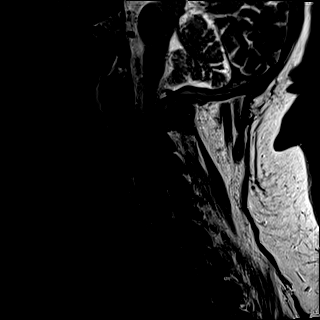
[im 12/17]
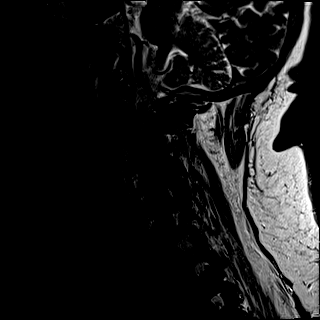
[im 13/17]
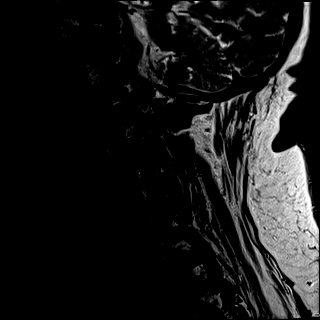
[im 14/17]
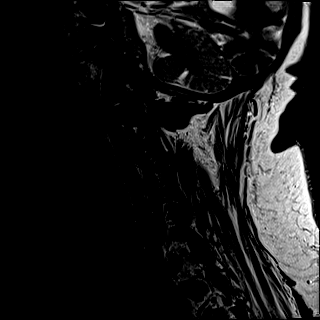
[im 15/17]
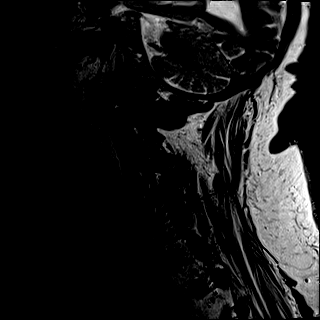
[im 16/17]
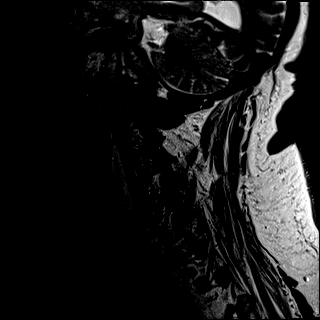
[im 17/17]
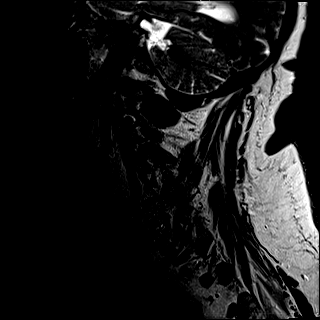

[Series 6: FLAIR · sagittal · 3.0mm · 0.78mm/px · 17 of 17 slices shown]
[im 1/17]
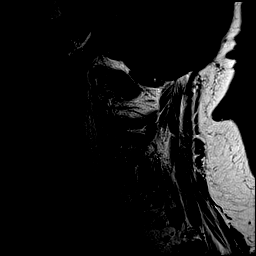
[im 2/17]
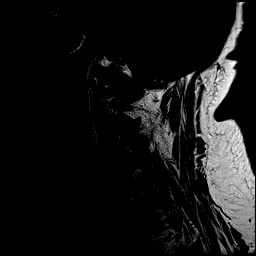
[im 3/17]
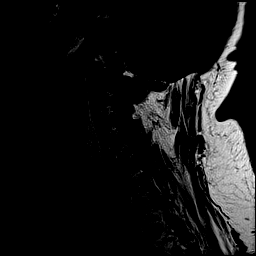
[im 4/17]
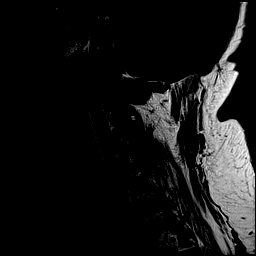
[im 5/17]
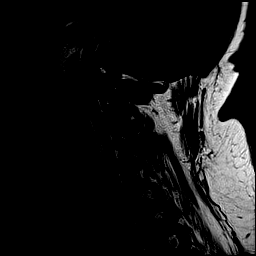
[im 6/17]
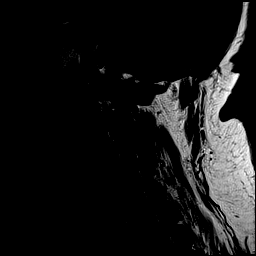
[im 7/17]
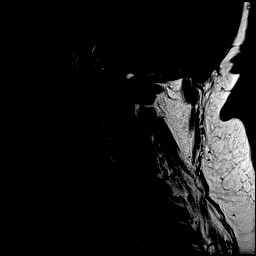
[im 8/17]
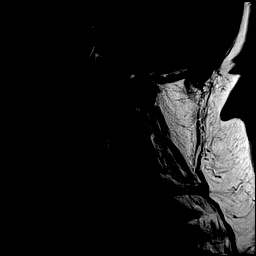
[im 9/17]
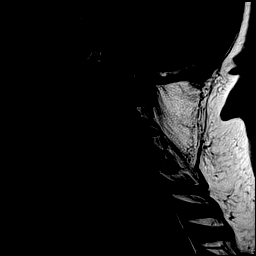
[im 10/17]
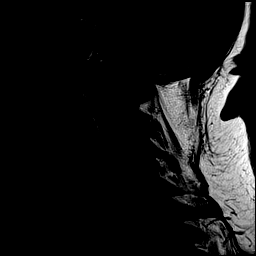
[im 11/17]
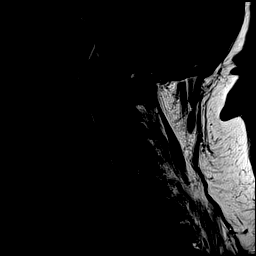
[im 12/17]
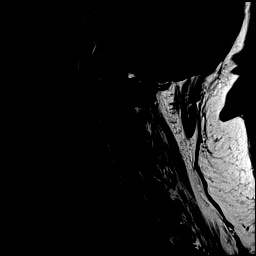
[im 13/17]
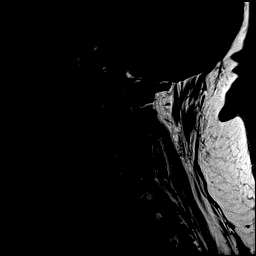
[im 14/17]
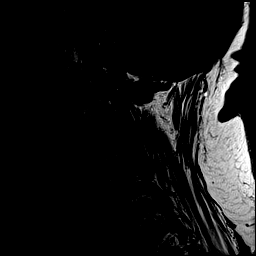
[im 15/17]
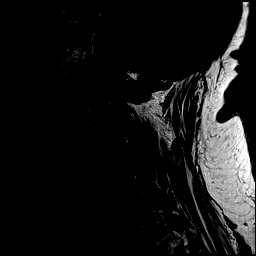
[im 16/17]
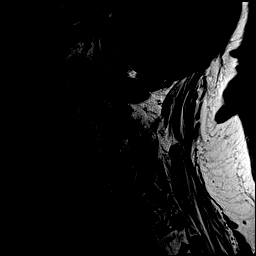
[im 17/17]
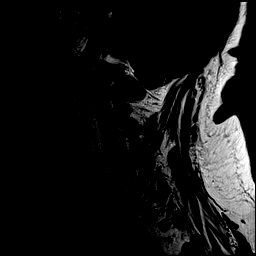

[34 of 34 positions shown; findings below may reference images not displayed]

FINDINGS: Alignment: Reversal of normal cervical lordosis.

Vertebrae: Left C3-4 facet edema.  No discitis.  No fracture.

Cord: Normal signal and morphology.

Posterior Fossa, vertebral arteries, paraspinal tissues: Negative

Disc levels:

C1-2: Unremarkable.

C2-3: No disc herniation. Mild right facet hypertrophy. There is no
spinal canal stenosis. No neural foraminal stenosis.

C3-4: Moderate facet hypertrophy. Small bilateral uncovertebral
osteophytes. Moderate spinal canal stenosis. Severe bilateral neural
foraminal stenosis.

C4-5: Left-greater-than-right uncovertebral osteophytes with
right-greater-than-left facet hypertrophy. Mild spinal canal
stenosis. Moderate right and severe left neural foraminal stenosis.

C5-6: Intermediate disc osteophyte complex. Mild spinal canal
stenosis. Mild right and moderate left neural foraminal stenosis.

C6-7: Mild uncovertebral spurring. There is no spinal canal
stenosis. No neural foraminal stenosis.

C7-T1: Normal disc space and facet joints. There is no spinal canal
stenosis. No neural foraminal stenosis.
IMPRESSION: 1. Left C3-4 facet edema, which may be a source of local neck pain.
2. Moderate C3-4 and mild C4-5/C5-6 spinal canal stenosis.
3. Multilevel moderate-to-severe neural foraminal stenosis, worst at
C4 and C5.
